# Patient Record
Sex: Female | Born: 1950
Health system: Southern US, Community
[De-identification: ages and names within clinical notes are randomized; demographics above are authoritative.]

## PROBLEM LIST (undated history)

## (undated) DIAGNOSIS — F419 Anxiety disorder, unspecified: Secondary | ICD-10-CM

## (undated) DIAGNOSIS — G709 Myoneural disorder, unspecified: Secondary | ICD-10-CM

## (undated) DIAGNOSIS — R278 Other lack of coordination: Secondary | ICD-10-CM

## (undated) DIAGNOSIS — M1990 Unspecified osteoarthritis, unspecified site: Secondary | ICD-10-CM

## (undated) DIAGNOSIS — M549 Dorsalgia, unspecified: Secondary | ICD-10-CM

## (undated) DIAGNOSIS — M2550 Pain in unspecified joint: Secondary | ICD-10-CM

## (undated) DIAGNOSIS — E041 Nontoxic single thyroid nodule: Secondary | ICD-10-CM

## (undated) DIAGNOSIS — Z923 Personal history of irradiation: Secondary | ICD-10-CM

## (undated) DIAGNOSIS — K760 Fatty (change of) liver, not elsewhere classified: Secondary | ICD-10-CM

## (undated) DIAGNOSIS — C50411 Malignant neoplasm of upper-outer quadrant of right female breast: Secondary | ICD-10-CM

## (undated) DIAGNOSIS — D649 Anemia, unspecified: Secondary | ICD-10-CM

## (undated) DIAGNOSIS — M21372 Foot drop, left foot: Secondary | ICD-10-CM

## (undated) DIAGNOSIS — F509 Eating disorder, unspecified: Secondary | ICD-10-CM

## (undated) DIAGNOSIS — J189 Pneumonia, unspecified organism: Secondary | ICD-10-CM

## (undated) DIAGNOSIS — E11319 Type 2 diabetes mellitus with unspecified diabetic retinopathy without macular edema: Secondary | ICD-10-CM

## (undated) DIAGNOSIS — I509 Heart failure, unspecified: Secondary | ICD-10-CM

## (undated) DIAGNOSIS — E669 Obesity, unspecified: Secondary | ICD-10-CM

## (undated) DIAGNOSIS — M21611 Bunion of right foot: Secondary | ICD-10-CM

## (undated) DIAGNOSIS — C50919 Malignant neoplasm of unspecified site of unspecified female breast: Secondary | ICD-10-CM

## (undated) DIAGNOSIS — IMO0001 Reserved for inherently not codable concepts without codable children: Secondary | ICD-10-CM

## (undated) DIAGNOSIS — G4730 Sleep apnea, unspecified: Secondary | ICD-10-CM

## (undated) DIAGNOSIS — E119 Type 2 diabetes mellitus without complications: Secondary | ICD-10-CM

## (undated) DIAGNOSIS — R011 Cardiac murmur, unspecified: Secondary | ICD-10-CM

## (undated) DIAGNOSIS — E785 Hyperlipidemia, unspecified: Secondary | ICD-10-CM

## (undated) DIAGNOSIS — Z91018 Allergy to other foods: Secondary | ICD-10-CM

## (undated) DIAGNOSIS — G4733 Obstructive sleep apnea (adult) (pediatric): Secondary | ICD-10-CM

## (undated) DIAGNOSIS — Z9989 Dependence on other enabling machines and devices: Secondary | ICD-10-CM

## (undated) DIAGNOSIS — R2689 Other abnormalities of gait and mobility: Secondary | ICD-10-CM

## (undated) DIAGNOSIS — N189 Chronic kidney disease, unspecified: Secondary | ICD-10-CM

## (undated) DIAGNOSIS — R51 Headache: Secondary | ICD-10-CM

## (undated) DIAGNOSIS — K5900 Constipation, unspecified: Secondary | ICD-10-CM

## (undated) DIAGNOSIS — I10 Essential (primary) hypertension: Secondary | ICD-10-CM

## (undated) DIAGNOSIS — F32A Depression, unspecified: Secondary | ICD-10-CM

## (undated) DIAGNOSIS — F329 Major depressive disorder, single episode, unspecified: Secondary | ICD-10-CM

## (undated) DIAGNOSIS — IMO0002 Reserved for concepts with insufficient information to code with codable children: Secondary | ICD-10-CM

## (undated) DIAGNOSIS — R040 Epistaxis: Secondary | ICD-10-CM

## (undated) DIAGNOSIS — N83209 Unspecified ovarian cyst, unspecified side: Secondary | ICD-10-CM

## (undated) HISTORY — PX: RETINAL LASER PROCEDURE: SHX2339

## (undated) HISTORY — DX: Hyperlipidemia, unspecified: E78.5

## (undated) HISTORY — PX: TOE SURGERY: SHX1073

## (undated) HISTORY — DX: Allergy to other foods: Z91.018

## (undated) HISTORY — DX: Obstructive sleep apnea (adult) (pediatric): Z99.89

## (undated) HISTORY — PX: TRIGGER FINGER RELEASE: SHX641

## (undated) HISTORY — DX: Reserved for concepts with insufficient information to code with codable children: IMO0002

## (undated) HISTORY — PX: DUPUYTREN CONTRACTURE RELEASE: SHX1478

## (undated) HISTORY — DX: Obesity, unspecified: E66.9

## (undated) HISTORY — DX: Personal history of irradiation: Z92.3

## (undated) HISTORY — DX: Essential (primary) hypertension: I10

## (undated) HISTORY — DX: Malignant neoplasm of unspecified site of unspecified female breast: C50.919

## (undated) HISTORY — DX: Dorsalgia, unspecified: M54.9

## (undated) HISTORY — DX: Obstructive sleep apnea (adult) (pediatric): G47.33

## (undated) HISTORY — DX: Reserved for inherently not codable concepts without codable children: IMO0001

## (undated) HISTORY — DX: Type 2 diabetes mellitus without complications: E11.9

## (undated) HISTORY — DX: Sleep apnea, unspecified: G47.30

## (undated) HISTORY — PX: APPENDECTOMY: SHX54

## (undated) HISTORY — DX: Eating disorder, unspecified: F50.9

## (undated) HISTORY — PX: CATARACT EXTRACTION: SUR2

## (undated) HISTORY — PX: COLONOSCOPY: SHX174

## (undated) HISTORY — DX: Malignant neoplasm of upper-outer quadrant of right female breast: C50.411

## (undated) HISTORY — DX: Type 2 diabetes mellitus with unspecified diabetic retinopathy without macular edema: E11.319

## (undated) HISTORY — PX: CARPAL TUNNEL RELEASE: SHX101

## (undated) HISTORY — DX: Bunion of right foot: M21.611

## (undated) HISTORY — DX: Anxiety disorder, unspecified: F41.9

## (undated) HISTORY — DX: Pain in unspecified joint: M25.50

## (undated) HISTORY — DX: Nontoxic single thyroid nodule: E04.1

## (undated) HISTORY — DX: Foot drop, left foot: M21.372

---

## 1997-06-15 ENCOUNTER — Encounter: Admission: RE | Admit: 1997-06-15 | Discharge: 1997-09-13 | Payer: Self-pay | Admitting: Family Medicine

## 1999-05-30 ENCOUNTER — Encounter: Admission: RE | Admit: 1999-05-30 | Discharge: 1999-08-28 | Payer: Self-pay | Admitting: Family Medicine

## 1999-07-31 ENCOUNTER — Other Ambulatory Visit: Admission: RE | Admit: 1999-07-31 | Discharge: 1999-07-31 | Payer: Self-pay | Admitting: Family Medicine

## 1999-09-26 ENCOUNTER — Encounter: Admission: RE | Admit: 1999-09-26 | Discharge: 1999-12-25 | Payer: Self-pay | Admitting: Internal Medicine

## 2001-09-16 ENCOUNTER — Other Ambulatory Visit: Admission: RE | Admit: 2001-09-16 | Discharge: 2001-09-16 | Payer: Self-pay | Admitting: Obstetrics and Gynecology

## 2001-11-05 ENCOUNTER — Encounter: Admission: RE | Admit: 2001-11-05 | Discharge: 2001-11-05 | Payer: Self-pay | Admitting: Family Medicine

## 2001-11-25 ENCOUNTER — Encounter: Admission: RE | Admit: 2001-11-25 | Discharge: 2001-11-25 | Payer: Self-pay | Admitting: Family Medicine

## 2001-12-10 ENCOUNTER — Encounter: Admission: RE | Admit: 2001-12-10 | Discharge: 2001-12-10 | Payer: Self-pay | Admitting: Family Medicine

## 2003-01-22 HISTORY — PX: ROTATOR CUFF REPAIR: SHX139

## 2003-02-16 ENCOUNTER — Ambulatory Visit (HOSPITAL_COMMUNITY): Admission: RE | Admit: 2003-02-16 | Discharge: 2003-02-16 | Payer: Self-pay | Admitting: *Deleted

## 2003-06-22 ENCOUNTER — Encounter: Admission: RE | Admit: 2003-06-22 | Discharge: 2003-06-22 | Payer: Self-pay | Admitting: Family Medicine

## 2003-07-19 ENCOUNTER — Ambulatory Visit (HOSPITAL_BASED_OUTPATIENT_CLINIC_OR_DEPARTMENT_OTHER): Admission: RE | Admit: 2003-07-19 | Discharge: 2003-07-19 | Payer: Self-pay | Admitting: Family Medicine

## 2003-11-21 ENCOUNTER — Encounter: Admission: RE | Admit: 2003-11-21 | Discharge: 2003-11-28 | Payer: Self-pay | Admitting: Family Medicine

## 2004-07-03 ENCOUNTER — Other Ambulatory Visit: Admission: RE | Admit: 2004-07-03 | Discharge: 2004-07-03 | Payer: Self-pay | Admitting: Family Medicine

## 2004-08-24 ENCOUNTER — Ambulatory Visit (HOSPITAL_COMMUNITY): Admission: RE | Admit: 2004-08-24 | Discharge: 2004-08-24 | Payer: Self-pay | Admitting: Orthopedic Surgery

## 2004-08-24 ENCOUNTER — Ambulatory Visit (HOSPITAL_BASED_OUTPATIENT_CLINIC_OR_DEPARTMENT_OTHER): Admission: RE | Admit: 2004-08-24 | Discharge: 2004-08-24 | Payer: Self-pay | Admitting: Orthopedic Surgery

## 2005-07-17 ENCOUNTER — Ambulatory Visit (HOSPITAL_BASED_OUTPATIENT_CLINIC_OR_DEPARTMENT_OTHER): Admission: RE | Admit: 2005-07-17 | Discharge: 2005-07-17 | Payer: Self-pay | Admitting: Orthopedic Surgery

## 2005-11-21 ENCOUNTER — Ambulatory Visit (HOSPITAL_BASED_OUTPATIENT_CLINIC_OR_DEPARTMENT_OTHER): Admission: RE | Admit: 2005-11-21 | Discharge: 2005-11-21 | Payer: Self-pay | Admitting: Orthopedic Surgery

## 2007-04-02 ENCOUNTER — Other Ambulatory Visit: Admission: RE | Admit: 2007-04-02 | Discharge: 2007-04-02 | Payer: Self-pay | Admitting: Obstetrics & Gynecology

## 2008-02-04 ENCOUNTER — Ambulatory Visit: Payer: Self-pay | Admitting: Cardiovascular Disease

## 2008-03-04 ENCOUNTER — Ambulatory Visit: Payer: Self-pay

## 2008-03-04 ENCOUNTER — Encounter: Payer: Self-pay | Admitting: Cardiovascular Disease

## 2008-03-09 ENCOUNTER — Ambulatory Visit: Payer: Self-pay

## 2008-03-31 ENCOUNTER — Ambulatory Visit (HOSPITAL_BASED_OUTPATIENT_CLINIC_OR_DEPARTMENT_OTHER): Admission: RE | Admit: 2008-03-31 | Discharge: 2008-03-31 | Payer: Self-pay | Admitting: Orthopedic Surgery

## 2008-03-31 ENCOUNTER — Encounter (INDEPENDENT_AMBULATORY_CARE_PROVIDER_SITE_OTHER): Payer: Self-pay | Admitting: Orthopedic Surgery

## 2008-05-17 ENCOUNTER — Other Ambulatory Visit: Admission: RE | Admit: 2008-05-17 | Discharge: 2008-05-17 | Payer: Self-pay | Admitting: Obstetrics and Gynecology

## 2010-01-26 LAB — BASIC METABOLIC PANEL
BUN: 19 mg/dL (ref 6–23)
CO2: 24 mEq/L (ref 19–32)
Calcium: 9.4 mg/dL (ref 8.4–10.5)
Chloride: 106 mEq/L (ref 96–112)
Creatinine, Ser: 1.17 mg/dL (ref 0.4–1.2)
GFR calc Af Amer: 58 mL/min — ABNORMAL LOW (ref >60)
GFR calc non Af Amer: 48 mL/min — ABNORMAL LOW (ref >60)
Glucose, Bld: 205 mg/dL — ABNORMAL HIGH (ref 70–99)
Potassium: 4.7 mEq/L (ref 3.5–5.1)
Sodium: 139 mEq/L (ref 135–145)

## 2010-01-30 ENCOUNTER — Ambulatory Visit
Admission: RE | Admit: 2010-01-30 | Discharge: 2010-01-30 | Payer: Self-pay | Source: Home / Self Care | Attending: Orthopedic Surgery | Admitting: Orthopedic Surgery

## 2010-02-05 LAB — GLUCOSE, CAPILLARY
Glucose-Capillary: 225 mg/dL — ABNORMAL HIGH (ref 70–99)
Glucose-Capillary: 233 mg/dL — ABNORMAL HIGH (ref 70–99)

## 2010-02-05 LAB — POCT HEMOGLOBIN-HEMACUE: Hemoglobin: 11.3 g/dL — ABNORMAL LOW (ref 12.0–15.0)

## 2010-03-12 NOTE — Op Note (Signed)
  NAMEJAZAYA, Darlene [name]           ACCOUNT NO.:  [id number]  MEDICAL RECORD NO.:  [id number]          PATIENT TYPE:  AMB  LOCATION:  [place]                          FACILITY:  [facility]  PHYSICIAN:  Daryll Brod, M.D.       DATE OF BIRTH:  1951-01-01  DATE OF PROCEDURE:  01/30/2010 DATE OF DISCHARGE:                              OPERATIVE REPORT   PREOPERATIVE DIAGNOSIS:  Stenosing tenosynovitis, right middle finger.  POSTOPERATIVE DIAGNOSIS:  Stenosing tenosynovitis, right middle finger.  OPERATION:  Release of A1 pulley, right middle finger.  SURGEON:  Daryll Brod, MD  ASSISTANT:  Dimas Millin, RN  ANESTHESIA:  Forearm-based IV regional.  HISTORY:  The patient is a 60 year old female with a history of triggering of her right middle finger.  This has not responded to conservative treatment.  She has elected to undergo surgical decompression.  Pre, peri, and postoperative course have been discussed along with risks and complications.  She is aware that there is no guarantee with surgery, possibility of infection, recurrence of injury to arteries, nerves, and tendons, incomplete relief of symptoms, and dystrophy.  In the preoperative area, the patient is seen, the extremity is marked by both the patient and surgeon, and antibiotic is given.  PROCEDURE:  The patient was brought to the operating room where a forearm-based IV regional anesthetic was carried out without difficulty. She was prepped using ChloraPrep, supine position with a right arm free. A 3-minute dry time was allowed.  A time-out was taken confirming the patient and procedure.  After adequate anesthesia was afforded, local infiltration was given with 0.25% Marcaine without epinephrine proximally, 4 mL was used.  An oblique incision was made over the A1 pulley of the right middle finger and carried down through subcutaneous tissue.  Bleeders were electrocauterized with bipolar.  Fairly significant scarring was present in the  palmar fascia above the A1 pulley.  This was released protecting neurovascular structures, both radially and ulnarly.  The A1 pulley was then released on its radial aspect.  Finger placed through full range of motion.  No further triggering was encountered.  The wound was copiously irrigated with saline.  The skin was then closed with interrupted 5-0 Vicryl Rapide sutures.  A sterile compressive dressing with the fingers free was applied.  On deflation of the tourniquet, all fingers immediately pinked.  She was taken to the recovery room for observation in satisfactory condition.  She will be discharged home to return to the [facility] in 1 week on Vicodin.          ______________________________ Daryll Brod, M.D.     GK/MEDQ  D:  01/30/2010  T:  01/30/2010  Job:  [id number]  cc:   Dub Mikes P. Jacelyn Grip, M.D.  Electronically Signed by Daryll Brod M.D. on 03/12/2010 02:22:37 PM

## 2010-04-23 ENCOUNTER — Ambulatory Visit: Payer: BC Managed Care – PPO | Attending: Family Medicine | Admitting: Physical Therapy

## 2010-04-23 DIAGNOSIS — R262 Difficulty in walking, not elsewhere classified: Secondary | ICD-10-CM | POA: Insufficient documentation

## 2010-04-23 DIAGNOSIS — IMO0001 Reserved for inherently not codable concepts without codable children: Secondary | ICD-10-CM | POA: Insufficient documentation

## 2010-04-23 DIAGNOSIS — R269 Unspecified abnormalities of gait and mobility: Secondary | ICD-10-CM | POA: Insufficient documentation

## 2010-04-30 ENCOUNTER — Ambulatory Visit: Payer: BC Managed Care – PPO | Admitting: Physical Therapy

## 2010-05-03 LAB — BASIC METABOLIC PANEL
BUN: 15 mg/dL (ref 6–23)
CO2: 25 mEq/L (ref 19–32)
Calcium: 9.1 mg/dL (ref 8.4–10.5)
Chloride: 106 mEq/L (ref 96–112)
Creatinine, Ser: 0.94 mg/dL (ref 0.4–1.2)
GFR calc Af Amer: >60 mL/min (ref >60)
GFR calc non Af Amer: >60 mL/min (ref >60)
Glucose, Bld: 182 mg/dL — ABNORMAL HIGH (ref 70–99)
Potassium: 5.2 mEq/L — ABNORMAL HIGH (ref 3.5–5.1)
Sodium: 139 mEq/L (ref 135–145)

## 2010-05-03 LAB — GLUCOSE, CAPILLARY
Glucose-Capillary: 152 mg/dL — ABNORMAL HIGH (ref 70–99)
Glucose-Capillary: 152 mg/dL — ABNORMAL HIGH (ref 70–99)

## 2010-05-03 LAB — POCT HEMOGLOBIN-HEMACUE: Hemoglobin: 11.7 g/dL — ABNORMAL LOW (ref 12.0–15.0)

## 2010-05-07 ENCOUNTER — Ambulatory Visit: Payer: BC Managed Care – PPO | Admitting: Physical Therapy

## 2010-05-14 ENCOUNTER — Ambulatory Visit: Payer: BC Managed Care – PPO | Admitting: Physical Therapy

## 2010-05-21 ENCOUNTER — Ambulatory Visit: Payer: BC Managed Care – PPO | Admitting: Physical Therapy

## 2010-05-28 ENCOUNTER — Ambulatory Visit: Payer: BC Managed Care – PPO | Attending: Family Medicine | Admitting: Physical Therapy

## 2010-05-28 DIAGNOSIS — R262 Difficulty in walking, not elsewhere classified: Secondary | ICD-10-CM | POA: Insufficient documentation

## 2010-05-28 DIAGNOSIS — R269 Unspecified abnormalities of gait and mobility: Secondary | ICD-10-CM | POA: Insufficient documentation

## 2010-05-28 DIAGNOSIS — IMO0001 Reserved for inherently not codable concepts without codable children: Secondary | ICD-10-CM | POA: Insufficient documentation

## 2010-06-05 NOTE — Op Note (Signed)
Darlene [name], [name]           ACCOUNT NO.:  [id number]   MEDICAL RECORD NO.:  [id number]          PATIENT TYPE:  AMB   LOCATION:  [facility]                          FACILITY:  [facility]   PHYSICIAN:  Daryll Brod, M.D.       DATE OF BIRTH:  09-15-1950   DATE OF PROCEDURE:  DATE OF DISCHARGE:                               OPERATIVE REPORT   PREOPERATIVE DIAGNOSIS:  Dupuytren contracture with stenosing  tenosynovitis, right ring finger.   POSTOPERATIVE DIAGNOSIS:  Dupuytren contracture with stenosing  tenosynovitis, right ring finger.   OPERATION:  Excision of palmar fascia release of A1 pulley right ring  finger.   SURGEON:  Daryll Brod, MD   ASSISTANT:  Dimas Millin, RN   ANESTHESIA:  Axillary block.   ANESTHESIOLOGIST:  Soledad Gerlach, MD   HISTORY:  The patient is a 60 year old female with a history of carpal  tunnel release.  She has developed a Dupuytren cord to her ring finger  with triggering.  This has not responded to conservative treatment.  She  does show pinning of her skin.  She is aware of risks and complications  including infection, recurrence of injury to arteries, nerves, tendons  complete relief of symptoms, dystrophy, the possibility of recurrence to  the Dupuytren cords.  In the preoperative area, the patient is seen, the  extremity marked by both the patient and surgeon.  Antibiotic given.   PROCEDURE:  The patient was brought to the operating room where an  axillary block was carried out without difficulty.  She was prepped  using DuraPrep, supine position, right arm free.  A time-out was taken  confirming the patient and procedure.   PROCEDURE:  The patient's limb was exsanguinated with an Esmarch  bandage, tourniquet placed high, and the arm was inflated 300 mmHg.  A  volar Brunner incision was made over the ring finger, carried down  through subcutaneous tissue.  Bleeders were electrocauterized.  Dissection was carried down to the palmar fascia.  This was  released  from the distal margin of the flexor retinaculum.  The cord to the  middle ring and little fingers were each resected proximally.  The cord  to the ring finger was then followed distally protecting the  neurovascular structures.  The ligaments of __________ were then  resected around the A1 pulley.  The A1 pulley was then released in its  radial aspect.  This allowed full flexion and extension of her finger  without further triggering.  The cords to the ring and middle finger  were also resected to the level of the A1 pulleys.  The wound was  irrigated with saline.  Bleeders were cauterized with bipolar.  Thrombin  was then sprayed over the wound.  A vessel loop drain was placed, and  the wound closed with interrupted 5-0 Vicryl Rapide sutures.  A sterile  compressive dressing was applied.  Tourniquet  deflated.  Fingers immediately pink.  A volar splint applied.  The  patient tolerated the procedure well and was taken to the recovery room  for observation in satisfactory condition.  She will be discharged home  to return to the [facility] in 1 week on Vicodin.            ______________________________  Daryll Brod, M.D.     GK/MEDQ  D:  03/31/2008  T:  03/31/2008  Job:  [id number]   cc:   Chipper Herb, M.D.

## 2010-06-05 NOTE — Assessment & Plan Note (Signed)
[facility] OFFICE NOTE   [name], [name]               MRN:          [id number]  DATE:02/04/2008                            DOB:          April 12, 1950    Darlene [name] is a 60 year old patient referred for question of a stress  test hypertension.   The patient also indicates that she has had a previous heart murmur.  Unfortunately, Darlene [name]'s biggest problem is her morbid obesity.  She is  305 pounds.  She said she has always been heavy and been a  yo-yo'er.  She has gained about 30 pounds in the last 6 months.   She tends to eat to release stress.  Because of her morbid obesity, she  has developed sleep apnea, hypertension, type 2 diabetes, and has had  some multiple orthopedic issues.   She also reports having chronic anxiety and depression.   She has a vague history of some sort of heart murmur.  She does not  recall having a recent stress test or echocardiogram.   She has exertional dyspnea.  She does not complain of chest pain.  There  have been occasional palpitations and chronic mild lower extremity  edema.   The patient has not thought seriously about bariatric surgery.  I had  long discussion with her regarding these issues and the fact that she is  at high risk for many morbid complications.  If she does not take  control of her weight, she already knows that her sleep apnea,  hypertension, and diabetes are related to her weight.   In regards to her heart, she certainly probably would benefit from a  Myoview study and an echo.  I did note that her EKG is abnormal.  She  has poor R-wave progression.  She has fairly large breasts and this just  maybe lead position and she has an isolated Q wave in lead III.  She is  also relatively tachycardic which maybe secondary to her weight as well.   We do not have any recent thyroid studies.  Her review of systems is  otherwise remarkable for  occasional headaches.  There is no history of  varicosities, DVT.   Her past medical history is otherwise remarkable for a previous right  rotator cuff surgery, trigger finger surgery, carpal tunnel surgery x2  as well as the above diabetes, central obesity, hypertension, anxiety,  and depression, and history of reflux.  The patient also complains of  some diarrhea, this seems to be a chronic problem, I believe she will be  referred to GI as well.  She is married.  She has no children.  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   She is sedentary.  Mother died at age 59 of a cerebral hemorrhage.  Father died at age 6 of congestive heart failure.   CURRENT MEDICATIONS:  1. Altace 10 mg a day.  2. Amlodipine 5 mg a day.  3. Hydrochlorothiazide 25 a day.  4. Prozac 20 a day.  5. Bupropion 200 a  day.  6. Simvastatin 20 a day.  7. NovoLog 14 units with meals.  8. Metformin 1 g b.i.d.  9. Lantus 100 units b.i.d.  10.Aspirin a day.  11.Calcium.   She has no known allergies.   PHYSICAL EXAMINATION:  GENERAL:  Morbidly obese female in no distress.  VITAL SIGNS:  Weight is 305, blood pressure is 140/80, pulse 95 and  regular, respiratory 14, and afebrile.  HEENT:  Unremarkable.  NECK:  Carotids are normal without bruit.  No lymphadenopathy,  thyromegaly, or JVP elevation.  LUNGS:  Clear with good diaphragmatic motion.  No wheezing.  CARDIAC:  S1 and S2 with soft systolic murmur.  PMI not palpable.  ABDOMEN:  Protuberant.  Bowel sounds positive.  No AAA, no tenderness,  no bruit, no hepatosplenomegaly or hepatojugular reflux.  EXTREMITIES:  Distal pulses intact with +1 edema.  NEUROLOGIC:  Nonfocal.  SKIN:  Warm and dry.  MUSCULOSKELETAL:  No muscular weakness.   EKG was as described, Q wave in lead III, relatively tachycardic, PR  interval 212, poor R-wave progression.   IMPRESSION:  1. Dyspnea, likely  secondary to morbid obesity.  Check stress Myoview.      Rule out ischemia.  2. Systolic ejection murmur.  Check 2-D echocardiogram.  Rule out      significant pulmonary hypertension from sleep apnea and check left      ventricular function.  3. Sleep apnea.  Follow up Pulmonary, will only be cured with weight      loss.  Continue CPAP as indicated.  4. Hypertension, currently well controlled.  Continue current dose of      ACE inhibitor.  5. Diabetes type 2, on large doses of Lantus, needs to be referred to      a nutritionist for decreased caloric and carbohydrate intake.  6. Hypercholesterolemia.  Continue simvastatin.  Lipid and liver      profile in 6 months.  7. Lower extremity edema, currently stable.  Continue      hydrochlorothiazide at current dose.  Elevate legs at the end of      the day.  8. Morbid obesity.  The number to the [facility] was given.  I      explained to her that this was her only meaningful way of losing      weight.  I suspect she will have many complications in the next 5      years if she does not opt for this type of surgery, particularly if      her Myoview      and echo show no obvious heart problems, she would be a very good      candidate for lap band procedure.     Wallis Bamberg. Johnsie Cancel, MD, [facility]  Electronically Signed    PCN/MedQ  DD: 02/04/2008  DT: 02/05/2008  Job #: [id number]   cc:   De Nurse

## 2010-06-08 NOTE — Op Note (Signed)
Darlene [name], Darlene [name]           ACCOUNT NO.:  [id number]   MEDICAL RECORD NO.:  [id number]          PATIENT TYPE:  AMB   LOCATION:  [place]                          FACILITY:  [facility]   PHYSICIAN:  Daryll Brod, M.D.       DATE OF BIRTH:  [birth year]   DATE OF PROCEDURE:  08/24/2004  DATE OF DISCHARGE:                                 OPERATIVE REPORT   PREOPERATIVE DIAGNOSIS:  Stenosing tenosynovitis, left ring finger; carpal  tunnel syndrome, left hand.   POSTOPERATIVE DIAGNOSIS:  Stenosing tenosynovitis, left ring finger; carpal  tunnel syndrome, left hand.   OPERATION:  Decompression left median nerve, release A1 pulley, left ring  finger.   SURGEON:  Kuzma.   ASSISTANT:  Dimas Millin R.N.   ANESTHESIA:  Forearm based IV regional.   HISTORY:  The patient is a 60 year old female with a history of locking of  her left ring finger, carpal tunnel syndrome, EMG nerve conductions  positive, which has not responded to conservative treatment.   PROCEDURE:  The patient is brought to the operating room where a forearm  based IV regional anesthetic was carried out without difficulty. She was  prepped using DuraPrep, supine position, left arm free. A oblique incision  was made over the A1 pulley of the left ring finger, carried down through  subcutaneous tissue. Bleeders were electrocauterized. Neurovascular  structures identified radially and ulnarly.  The A1 pulley was then released  on its radial aspect and incision was made in the A2 pulley centrally.  Finger placed through full range motion, no further triggering was  identified.  The wound was irrigated. Skin was closed with interrupted 5-0  nylon sutures. A separate incision was then made over the carpal canal  midpalm, carried down through subcutaneous tissue. Bleeders were again  electrocauterized. Palmar fascia was split, superficial palmar arch  identified, flexor tendon to the ring and little finger identified to the  ulnar side of  median nerve.  Carpal retinaculum was incised with sharp  dissection, right angle and Sewell retractor were placed between skin  forearm fascia. The fascia was released for approximately a centimeter and a  half proximal to the wrist crease under direct vision. The canal was  explored.  Tenosynovial tissues thickened and area of deformity to the nerve  apparent. No further lesions were identified. The wound was irrigated. Skin  closed with interrupted 5-0 nylon sutures. A sterile compressive dressing  and splint was applied. The patient tolerated the procedure well and was  taken to the recovery room for observation in satisfactory condition. She is  discharged home to return to the [facility] in one week on  Vicodin.       GK/MEDQ  D:  08/24/2004  T:  08/25/2004  Job:  [id number]

## 2010-06-08 NOTE — Op Note (Signed)
NAMENARALY, GOODLOE           ACCOUNT NO.:  [id number]   MEDICAL RECORD NO.:  [id number]          PATIENT TYPE:  AMB   LOCATION:  [place]                          FACILITY:  [facility]   PHYSICIAN:  Daryll Brod, M.D.       DATE OF BIRTH:  07/01/1950   DATE OF PROCEDURE:  11/21/2005  DATE OF DISCHARGE:                                 OPERATIVE REPORT   PREOPERATIVE DIAGNOSIS:  Carpal tunnel syndrome, right hand.   POSTOPERATIVE DIAGNOSIS:  Carpal tunnel syndrome, right hand.   OPERATION:  Decompression of right median nerve.   SURGEON:  Daryll Brod, M.D.   ASSISTANT:  Dimas Millin, R.N.   ANESTHESIA:  Forearm-based IV regional.   HISTORY:  The patient is a 60 year old female with a history of carpal  tunnel syndrome, EMG nerve conductions which has not responded to  conservative treatment.  She is aware of risks and complications including  infection, recurrence, injury to arteries, nerves, tendons complete relief  of symptoms, dystrophy.  She has elected to proceed to have this released.  In the preoperative area, questions are encouraged and answered.  The  extremity marked by both the patient and surgeon.   PROCEDURE:  The patient was brought to the operating room where a forearm-  based IV regional anesthetic was carried out without difficulty.  She was  prepped using DuraPrep, supine position, right arm free.   After adequate anesthesia was afforded to the patient, a longitudinal  incision was made in the palm and carried down through the subcutaneous  tissue.  Bleeders were electrocauterized.  She had some feeling.  The local  infiltration was given.  The superficial palmar arch was identified, flexor  tendon of the ring and little finger identified to the ulnar side of median  nerve and the carpal retinaculum was incised.  She had some feeling.  A  median nerve block was given at the wrist with 1% Xylocaine without  epinephrine.  The  completion of the transection of the carpal  retinaculum  was then performed, protecting the median nerve and ulnar nerve.  A Sewall  retractor was placed between the distal forearm fascia and the retinaculum  and skin, and along with a right angle retractor under direct vision, the  remainder of the carpal retinaculum was incised along with the distal  forearm fascia for approximately 1.5 cm proximal to the wrist crease.  The  canal was explored.  An area of compression to the nerve was apparent.  No  further lesions were identified.  The wound was irrigated.  The skin was  closed with interrupted 5-0 nylon sutures.  A sterile compressive dressing  and splint was applied.   The patient tolerated the procedure well was taken to the recovery room for  observation in satisfactory condition.  She is discharged home to return to  the [facility] in 1 week, on Vicodin.           ______________________________  Daryll Brod, M.D.     GK/MEDQ  D:  11/21/2005  T:  11/21/2005  Job:  [id number]   cc:  Francis P. Jacelyn Grip, M.D.

## 2010-06-08 NOTE — Consult Note (Signed)
[facility]  Patient:    Darlene [name], Darlene [name].[name]                      MRN: [id number] Proc. Date: 09/26/99 Adm. Date:  [id number] Attending:  Danny Lawless CC:         Anthoney Harada, M.D.   Consultation Report  HISTORY OF PRESENT ILLNESS:  This 60 year old white female is seen at the courtesy of Dr. Jacelyn Grip for evaluation and management of her feet in the face of known diabetes and chronic callus formation.  The patient apparently has had type 2 diabetes for the past 15 years and has been in chronic poor control, manifesting even here a rather flippant attitude about the importance of good self-care.  She is also quite obese with a resultant heavy load placed upon her feet.  She has had recurrent callus formation throughout the years and has fairly striking bunion formation, particularly on the left foot.  She has had paring of these calluses and was given a rigid orthotic insert by a podiatrist in the past, which insert has now cracked.  She has never had an ulceration of her feet and apparently does not have much in the way of neuropathic symptomatology.  Thus she presents now with recurrent, rather severe and uncomfortable calluses on the halluces and elsewhere on the plantar surfaces.  PAST MEDICAL HISTORY:  Really remarkable only for those things previously mentioned and for hypertension and depression.  She has had bone spurs removed from the fourth and fifth toes on the right foot in 1980 with no resulting surgical deformity.  MEDICATIONS:  Glucovance 5/500 two tablets b.i.d., a baby aspirin one daily, Altace 2.5 mg daily, Avandia 8 mg daily, Xenical 120 mg rarely used, Prozac 90 mg weekly.  ALLERGIES:  She has no known medicinal allergies.  PHYSICAL EXAMINATION:  Examination today is limited to the distal lower extremities.  The feet are without gross deformity with the exception of modest bunion formation (hallux valgus) bilaterally,  slightly greater on the left than on the right.  There is good preservation of the longitudinal arches bilaterally.  Skin temperatures are normal and essentially symmetrical.  Her pulses are everywhere palpable and quite adequate.  There is no edema. Monofilament testing shows protective sensation throughout.  There are no significant calluses over the bunions.  There is widespread bilateral callus formation in the heels, and there are specific hard, hypertrophic calluses at the interphalangeal joints at the hallux bilaterally.  There are also calluses underlying the fifth metatarsal head on the right and the first metatarsal head on the left.  DISPOSITION: 1. The patient is given general instruction regarding foot care and diabetes    by video instruction with nurse and physician reinforcement.  This    physician also reinforced the importance of her bringing her diabetes under    better control (recent hemoglobin A1C 9.1%). 2. The aforementioned calluses were removed by dremeling on both heels and by    sharp paring underlying the metatarsal head areas and on both halluces. 3. It was recommended to the patient that she obtain a soft over-the-counter    insert for each foot and to specifically search for one that had a    longitudinal arch support built into it.  In that she has preservation of    protective sensation of both feet, she does not qualify for insurance    purchase of custom inserts. 4. Again it was emphasized to the  patient the need for weight reduction, good    diabetes control, and proper shoe and footwear in diabetes as well as    proper care of the feet. 5. Follow-up visit to this clinic was to be on a p.r.n. basis. DD:  09/26/99 TD:  09/27/99 Job: [id number]

## 2010-06-08 NOTE — Op Note (Signed)
NAMEJACLEEN, Darlene [name]           ACCOUNT NO.:  [id number]   MEDICAL RECORD NO.:  [id number]          PATIENT TYPE:  AMB   LOCATION:  [place]                          FACILITY:  [facility]   PHYSICIAN:  Daryll Brod, M.D.       DATE OF BIRTH:  May 26, 1950   DATE OF PROCEDURE:  07/17/2005  DATE OF DISCHARGE:                                 OPERATIVE REPORT   PREOPERATIVE DIAGNOSIS:  Stenosing tenosynovitis left middle finger.   POSTOPERATIVE DIAGNOSIS:  Stenosing tenosynovitis left middle finger.   OPERATION:  Release A1 pulley left middle finger.   SURGEON:  Daryll Brod, M.D.   ANESTHESIA:  Forearm based IV regional.   HISTORY:  The patient is a 60 year old female with history of triggering on  multiple digits.  She has recently begun triggering of her left middle  finger.  This did not respond to conservative treatment.  She is admitted  now for release.  In the preoperative area questions were encouraged and  answered.  The site marked by both the patient and surgeon.   PROCEDURE:  The patient was brought to the operating room where a forearm  based IV regional anesthetic was carried out without difficulty.  She was  prepped using DuraPrep, supine position, left arm free.  After 3-minute dry  time she was draped.  An oblique incision was made over the A1 pulley of the  left middle finger, carried down through subcutaneous tissue.  Bleeders were  electrocauterized.  The digital artery and nerve radially and ulnarly were  identified.  The A1 pulley was then released on its radial aspect.  An  incision was made in the central portion proximally of the A2 pulley.  The  finger placed through a full range motion, no further triggering was  identified.  The wound was irrigated.  The skin closed with interrupted 5-0  nylon sutures.  Sterile compressive dressing was applied.  The patient  tolerated the procedure well and was taken to the recovery room for  observation in satisfactory condition.   She is discharged home to return to  [facility] in 1 week on Vicodin.           ______________________________  Daryll Brod, M.D.     GK/MEDQ  D:  07/17/2005  T:  07/17/2005  Job:  [id number]

## 2010-11-19 ENCOUNTER — Encounter: Payer: Self-pay | Admitting: *Deleted

## 2010-11-19 ENCOUNTER — Encounter: Payer: BC Managed Care – PPO | Attending: Endocrinology | Admitting: *Deleted

## 2010-11-19 DIAGNOSIS — E119 Type 2 diabetes mellitus without complications: Secondary | ICD-10-CM | POA: Insufficient documentation

## 2010-11-19 DIAGNOSIS — Z713 Dietary counseling and surveillance: Secondary | ICD-10-CM | POA: Insufficient documentation

## 2010-11-19 NOTE — Progress Notes (Signed)
  Medical Nutrition Therapy:  Appt start time: 0800 end time:  0900.   Assessment:  Primary concerns today: Nutrition counseling for weight loss and Type 2 Diabetes, diagnosed in 1986. Expresses good knowledge of diabetes, nutrition recommendations and value of exercise. She is going to water aerobics 3 evenings a week for past month with good success. She states her husband has been successful with significant weight loss this past year with portion control, which she struggles with. She tests her BG 1-5 times per day and takes her insulin "most of the time", the lunch dose is difficult sometimes.  MEDICATIONS: see list. She states she may be changing from Novolog 70/30 TID various dose at each meal back to Lantus BID and Novolog at meals.    DIETARY INTAKE:  Usual eating pattern includes 3 meals and 3 snacks per day.  Everyday foods include good variety of all food groups.  Avoided foods include dairy milk.    24-hr recall:  B ( AM): 1-2 sausage patties, english muffin or flat bagel, almond milk and occasional fresh fruit  Snk ( AM): there is candy and crackers readily available at work which she tries to avoid  L ( PM): has switched from fast food daily to bringing frozen meals such as Lean Cuisine or Healthy Choice with low fat yogurt and fruit Snk ( PM): candy, crackers per above D ( PM): husband prepares supper, now eating lean meat and 2 vegetables, occasionally a starch Snk ( PM): occasional Beverages: almond milk, diet Mountain Dew  Usual physical activity: attends water aerobic class 3 evenings a week!  Estimated energy needs: 1500 calories 170 g carbohydrates 112 g protein 42 g fat  Progress Towards Goal(s):  In progress.   Nutritional Diagnosis:  NI-5.8.4 Inconsistent carbohydrate intake As related to Type 2 diabetes and her insulin doses.  As evidenced by variable BG including hyper and hypoglycemia.    Intervention:  Nutrition counseling provided with the following  goals set: Aim for 3 carb choices per meal, 1 per snack Give herself credit for habits she is doing well, vs only criticism. Continue with water aerobic classes 3 times per week Consider using 1/2 cup measure as serving utensil for starchy foods.  Handouts given during visit include:  Carb Counting and Meal Planning by NovoNordisc  Carb Counting and Beyond Handout  Monitoring/Evaluation:  Dietary intake of 3 Carbs/Meal and 1/snack if hungry, exercise, positive reinforcement of successes , and body weight in 4 week(s).

## 2010-11-27 ENCOUNTER — Telehealth: Payer: Self-pay | Admitting: Cardiovascular Disease

## 2010-11-27 NOTE — Telephone Encounter (Signed)
Spoke with nicole, results from echo from 2010 faxed to the number provided Darlene [name]

## 2010-11-27 NOTE — Telephone Encounter (Signed)
Elmyra Ricks from [facility] calling wanting results of pt ECHO. Please fax results to office.   Fax: [phone]

## 2011-06-04 LAB — HM PAP SMEAR: HM Pap smear: NEGATIVE

## 2011-11-29 LAB — HM MAMMOGRAPHY: HM Mammogram: NORMAL

## 2012-06-05 ENCOUNTER — Ambulatory Visit: Payer: Self-pay | Admitting: Nurse Practitioner

## 2012-06-18 ENCOUNTER — Encounter: Payer: Self-pay | Admitting: *Deleted

## 2012-06-23 ENCOUNTER — Ambulatory Visit (INDEPENDENT_AMBULATORY_CARE_PROVIDER_SITE_OTHER): Payer: BC Managed Care – PPO | Admitting: Nurse Practitioner

## 2012-06-23 ENCOUNTER — Encounter: Payer: Self-pay | Admitting: Nurse Practitioner

## 2012-06-23 VITALS — BP 142/60 | HR 66 | Ht 68.0 in | Wt 286.6 lb

## 2012-06-23 DIAGNOSIS — Z01419 Encounter for gynecological examination (general) (routine) without abnormal findings: Secondary | ICD-10-CM

## 2012-06-23 NOTE — Patient Instructions (Signed)

## 2012-06-23 NOTE — Progress Notes (Signed)
62 y.o. [id number] Married Caucasian Fe here for annual exam.  She feels well, no vaso symptoms.  Rarely sexually active secondary to husbands health issues.   History of endo biopsy 02/03/08 showing scant proliferative endometrium and had been on Provera 5 mg 1/2 tab daily. At last AEX she was to be on Provera 10 mg every 3 months. Patient got Rx but was confused about when to take and for how many days.  So she did not take Rx.    Patient's last menstrual period was 10/22/2007.          Sexually active: yes  The current method of family planning is post menopausal status.    Exercising: no  The patient does not participate in regular exercise at present. Smoker:  no  Health Maintenance: Pap:  06/04/2011  Negative with neg HR HPV MMG:  11/29/2011 normal Colonoscopy:  2004 normal Will get repeat at age 52, PCP is scheduling BMD:   never TDaP:  PCP maintains tetanus.  Labs: PCP does lab (blood) work.    reports that she has never smoked. She has never used smokeless tobacco. She reports that she does not drink alcohol or use illicit drugs.  Past Medical History  Diagnosis Date  . Obesity   . Eating disorder   . Hypertension   . Amenorrhea   . Hyperlipidemia   . Diabetes mellitus without complication   . Dizzy spells     History reviewed. No pertinent past surgical history.  Current Outpatient Prescriptions  Medication Sig Dispense Refill  . aspirin 81 MG tablet Take 81 mg by mouth daily.        . B-D INS SYR ULTRAFINE 1CC/30G 30G X 1/2" 1 ML MISC       . buPROPion (WELLBUTRIN XL) 300 MG 24 hr tablet Take 300 mg by mouth daily.        . hydrochlorothiazide (HYDRODIURIL) 25 MG tablet Take 25 mg by mouth daily.        . insulin aspart protamine-insulin aspart (NOVOLOG 70/30) (70-30) 100 UNIT/ML injection Inject 60 Units into the skin daily with breakfast.        . losartan-hydrochlorothiazide (HYZAAR) 100-25 MG per tablet       . metFORMIN (GLUCOPHAGE) 1000 MG tablet Take 1,000 mg by  mouth 2 (two) times daily with a meal.        . metoprolol (TOPROL-XL) 200 MG 24 hr tablet       . mometasone (NASONEX) 50 MCG/ACT nasal spray Place 2 sprays into the nose daily as needed.        . simvastatin (ZOCOR) 20 MG tablet Take 20 mg by mouth at bedtime.         No current facility-administered medications for this visit.    Family History  Problem Relation Age of Onset  . CVA Mother   . Diabetes Father   . Heart failure Father   . Hypertension Sister   . Diabetes Brother     ROS:  Pertinent items are noted in HPI.  Otherwise, a comprehensive ROS was negative.  Exam:   BP 142/60  Pulse 66  Ht 5\' 8"  (1.727 m)  Wt 286 lb 9.6 oz (130.001 kg)  BMI 43.59 kg/m2  LMP 10/22/2007 Height: 5\' 8"  (172.7 cm)  Ht Readings from Last 3 Encounters:  06/23/12 5\' 8"  (1.727 m)  11/19/10 5\' 9"  (1.753 m)    General appearance: alert, cooperative and appears stated age Head: Normocephalic, without obvious abnormality, atraumatic  Neck: no adenopathy, supple, symmetrical, trachea midline and thyroid normal to inspection and palpation Lungs: clear to auscultation bilaterally Breasts: normal appearance, no masses or tenderness Heart: regular rate and rhythm Abdomen: soft, non-tender; no masses,  no organomegaly Extremities: extremities normal, atraumatic, no cyanosis or edema Skin: Skin color, texture, turgor normal. No rashes or lesions Lymph nodes: Cervical, supraclavicular, and axillary nodes normal. No abnormal inguinal nodes palpated Neurologic: Grossly normal   Pelvic: External genitalia:  no lesions              Urethra:  normal appearing urethra with no masses, tenderness or lesions              Bartholin's and Skene's: normal                 Vagina: normal appearing vagina with normal color and discharge, no lesions              Cervix: anteverted              Pap taken: no Bimanual Exam:  Uterus:  normal size, contour, position, consistency, mobility, non-tender               Adnexa: no mass, fullness, tenderness               Rectovaginal: Confirms               Anus:  normal sphincter tone, no lesions  A:  Well Woman with normal exam  Postmenopausal  Obesity, DM, HTN  P:   Pap smear as per guidelines   Mammogram due 11/14   return annually or prn  An After Visit Summary was printed and given to the patient.

## 2012-06-24 NOTE — Progress Notes (Signed)
Due to her obesity, I think you should prescribe provera 10mg  x 10 days every three months.  If she doesn't bleed in a year, maybe decrease to twice yearly.

## 2012-06-25 NOTE — Progress Notes (Signed)
Let patient know that I discussed with Dr. Sabra Heck and she does want her to take Provera 10 mg for 10 days every 3 months.  This was the original Rx. But patient could not understand the directions so failed to take for this entire year.  Please explain this to her when you call.

## 2012-07-22 ENCOUNTER — Telehealth: Payer: Self-pay | Admitting: *Deleted

## 2012-07-22 NOTE — Telephone Encounter (Signed)
Message copied by Ellene Route on Wed Jul 22, 2012 12:01 PM ------      Message from: Kem Boroughs R      Created: Thu Jun 25, 2012  8:40 AM                   ----- Message -----         From: Lyman Speller, MD         Sent: 06/24/2012   8:39 AM           To: Milford Cage, FNP                        ----- Message -----         From: Milford Cage, FNP         Sent: 06/23/2012   5:17 PM           To: Lyman Speller, MD             ------

## 2012-07-22 NOTE — Telephone Encounter (Signed)
Message copied by Ellene Route on Wed Jul 22, 2012 11:58 AM ------      Message from: Kem Boroughs R      Created: Thu Jun 25, 2012  8:40 AM                   ----- Message -----         From: Lyman Speller, MD         Sent: 06/24/2012   8:39 AM           To: Milford Cage, FNP                        ----- Message -----         From: Milford Cage, FNP         Sent: 06/23/2012   5:17 PM           To: Lyman Speller, MD             ------

## 2012-07-22 NOTE — Telephone Encounter (Signed)
Message copied by Ellene Route on Wed Jul 22, 2012 11:54 AM ------      Message from: Kem Boroughs R      Created: Thu Jun 25, 2012  8:40 AM                   ----- Message -----         From: Lyman Speller, MD         Sent: 06/24/2012   8:39 AM           To: Milford Cage, FNP                        ----- Message -----         From: Milford Cage, FNP         Sent: 06/23/2012   5:17 PM           To: Lyman Speller, MD             ------

## 2012-07-22 NOTE — Telephone Encounter (Signed)
Patient was notified of response from Dr. Sabra Heck and Edman Circle concerning mediation of Provera. Patient states she remembers discussing this with Patty and at this time does not wish to do any medications. Patient states she is in the process of being seen to have lap band surgery for her obesity and feels she does not need any medication at this time but will  Call if has any vaginal spotting.

## 2012-07-23 ENCOUNTER — Other Ambulatory Visit: Payer: Self-pay | Admitting: Internal Medicine

## 2012-09-03 ENCOUNTER — Ambulatory Visit (INDEPENDENT_AMBULATORY_CARE_PROVIDER_SITE_OTHER): Payer: BC Managed Care – PPO | Admitting: Surgery

## 2012-09-03 ENCOUNTER — Encounter (INDEPENDENT_AMBULATORY_CARE_PROVIDER_SITE_OTHER): Payer: Self-pay | Admitting: Surgery

## 2012-09-03 ENCOUNTER — Other Ambulatory Visit (INDEPENDENT_AMBULATORY_CARE_PROVIDER_SITE_OTHER): Payer: Self-pay

## 2012-09-03 DIAGNOSIS — E119 Type 2 diabetes mellitus without complications: Secondary | ICD-10-CM | POA: Insufficient documentation

## 2012-09-03 DIAGNOSIS — Z794 Long term (current) use of insulin: Secondary | ICD-10-CM | POA: Insufficient documentation

## 2012-09-03 DIAGNOSIS — Z6841 Body Mass Index (BMI) 40.0 and over, adult: Secondary | ICD-10-CM

## 2012-09-03 NOTE — Patient Instructions (Signed)
Two weeks prior to surgery  Go on the extremely low carb liquid diet One week prior to surgery  No aspirin products.  Tylenol is acceptable  Stop smoking 24 hours prior to surgery  No alcoholic beverages  Report fever greater than 100.5 or excessive nasal drainage suggesting infection  Continue bariatric preop diet  Perform bowel prep if ordered  Do not eat or drink anything after midnight the night before surgery  Do not take any medications except those instructed by the anesthesiologist Morning of surgery  Please arrive at the hospital at least 2 hours before your scheduled surgery time.  No makeup, fingernail polish or jewelry  Bring insurance cards with you  Bring your CPAP mask if you use this   

## 2012-09-03 NOTE — Progress Notes (Signed)
Chief Complaint:  Interested in bariatric surgery; lapband;  BMi 8  History of Present Illness:  Darlene [name] is an 62 y.o. female who has had a lifelong obesity issues and he comes today with her husband to discuss laparoscopic adjustable gastric bands. She has had problems with obesity most all of her life. She has tried numerous weight loss he experiences to try to help control her type 2 diabetes. At one point she lost 90 pounds. Her diabetes was greatly improved by that.  We had a discussion regarding lap band, gastric sleeve, and Roux-en-Y gastric bypass. She understands the elements of these but is leaning toward laparoscopic adjustable gastric banding. I think this most since her comfort level. She was again the journey and I described the workup. She still has her gallbladder.  Past Medical History  Diagnosis Date  . Obesity   . Eating disorder   . Hypertension   . Hyperlipidemia   . Diabetes mellitus without complication   . Dizzy spells     Past Surgical History  Procedure Laterality Date  . Carpal tunnel release Right   . Carpal tunnel release Left     Current Outpatient Prescriptions  Medication Sig Dispense Refill  . aspirin 81 MG tablet Take 81 mg by mouth daily.        . B-D INS SYR ULTRAFINE 1CC/30G 30G X 1/2" 1 ML MISC       . insulin aspart protamine-insulin aspart (NOVOLOG 70/30) (70-30) 100 UNIT/ML injection Inject 60 Units into the skin daily with breakfast.        . losartan-hydrochlorothiazide (HYZAAR) 100-25 MG per tablet       . metFORMIN (GLUCOPHAGE) 1000 MG tablet Take 1,000 mg by mouth 2 (two) times daily with a meal.        . metoprolol (TOPROL-XL) 200 MG 24 hr tablet       . pramipexole (MIRAPEX) 0.125 MG tablet       . simvastatin (ZOCOR) 20 MG tablet Take 20 mg by mouth at bedtime.        . Vortioxetine HBr (BRINTELLIX) 10 MG TABS Take by mouth.      . hydrochlorothiazide (HYDRODIURIL) 25 MG tablet Take 25 mg by mouth daily.        .  mometasone (NASONEX) 50 MCG/ACT nasal spray Place 2 sprays into the nose daily as needed.         No current facility-administered medications for this visit.   Other Family History  Problem Relation Age of Onset  . CVA Mother   . Diabetes Father   . Heart failure Father   . Hypertension Sister   . Diabetes Brother    Social History:   reports that she has never smoked. She has never used smokeless tobacco. She reports that she does not drink alcohol or use illicit drugs.   REVIEW OF SYSTEMS - PERTINENT POSITIVES ONLY: Negative for DVT. She's never had any problems with her heart or GI issues.  Physical Exam:   Blood pressure 148/84, pulse 64, temperature 98.2 F (36.8 C), temperature source Temporal, resp. rate 15, height 5\' 9"  (1.753 m), weight 291 lb 9.6 oz (132.269 kg), last menstrual period 10/22/2007. Body mass index is 43.04 kg/(m^2).  Gen:  WDWN white female NAD  Neurological: Alert and oriented to person, place, and time. Motor and sensory function is grossly intact  Head: Normocephalic and atraumatic.  Eyes: Conjunctivae are normal. Pupils are equal, round, and reactive to light. No scleral  icterus.  Neck: Normal range of motion. Neck supple. No tracheal deviation or thyromegaly present.  Cardiovascular:  SR without murmurs or gallops.  No carotid bruits Respiratory: Effort normal.  No respiratory distress. No chest wall tenderness. Breath sounds normal.  No wheezes, rales or rhonchi.  Abdomen:  nontender GU: Musculoskeletal: Normal range of motion. Extremities are nontender. No cyanosis, edema or clubbing noted Lymphadenopathy: No cervical, preauricular, postauricular or axillary adenopathy is present Skin: Skin is warm and dry. No rash noted. No diaphoresis. No erythema. No pallor. Pscyh: Normal mood and affect. Behavior is normal. Judgment and thought content normal.   LABORATORY RESULTS: No results found for this or any previous visit (from the past 48  hour(s)).  RADIOLOGY RESULTS: No results found.  Problem List: There are no active problems to display for this patient.   Assessment & Plan: Morbid obesity with BMI 43 and diabetes mellitus. Plan begin workup for laparoscopic adjustable gastric banding.    Matt B. Hassell Done, MD, [facility] [phone] beeper [phone]  09/03/2012 12:11 PM

## 2012-09-04 ENCOUNTER — Encounter (INDEPENDENT_AMBULATORY_CARE_PROVIDER_SITE_OTHER): Payer: Self-pay

## 2012-09-09 ENCOUNTER — Other Ambulatory Visit (INDEPENDENT_AMBULATORY_CARE_PROVIDER_SITE_OTHER): Payer: Self-pay

## 2012-09-10 ENCOUNTER — Ambulatory Visit (INDEPENDENT_AMBULATORY_CARE_PROVIDER_SITE_OTHER): Payer: BC Managed Care – PPO | Admitting: Surgery

## 2012-09-10 ENCOUNTER — Encounter (INDEPENDENT_AMBULATORY_CARE_PROVIDER_SITE_OTHER): Payer: Self-pay | Admitting: Surgery

## 2012-09-10 VITALS — BP 140/80 | HR 64 | Temp 98.4°F | Resp 15 | Ht 69.0 in | Wt 291.4 lb

## 2012-09-10 DIAGNOSIS — E785 Hyperlipidemia, unspecified: Secondary | ICD-10-CM

## 2012-09-10 DIAGNOSIS — F32A Depression, unspecified: Secondary | ICD-10-CM

## 2012-09-10 DIAGNOSIS — F329 Major depressive disorder, single episode, unspecified: Secondary | ICD-10-CM | POA: Insufficient documentation

## 2012-09-10 NOTE — Patient Instructions (Signed)
Gastric Bypass Surgery Care After Refer to this sheet in the next few weeks. These discharge instructions provide you with general information on caring for yourself after you leave the hospital. Your caregiver may also give you specific instructions. Your treatment has been planned according to the most current medical practices available, but unavoidable complications sometimes occur. If you have any problems or questions after discharge, call your caregiver. HOME CARE INSTRUCTIONS  Activity  Take frequent walks throughout the day. This will help to prevent blood clots. Do not sit for longer than 45 minutes to 1 hour while awake for 4 to 6 weeks after surgery.  Continue to do coughing and deep breathing exercises once you get home. This will help to prevent pneumonia.  Do not do strenuous activities, such as heavy lifting, pushing, or pulling, until after your follow-up visit with your caregiver. Do not lift anything heavier than 10 lb (4.5 kg).  Talk with your caregiver about when you may return to work and your exercise routine.  Do not drive while taking prescription pain medicine. Nutrition  It is very important that you drink at least 80 oz (2,400 mL) of fluid a day.  You should stay on a clear liquid diet until your follow-up visit with your caregiver. Keep sugar-free, clear liquid items on hand, including:  Tea: hot or cold. Drink only decaffeinated for the first month.  Broths: clear beef, chicken, vegetable.  Others: water, sugar-free frozen ice pops, flavored water, gelatin (after 1 week).  Do not consume caffeine for 1 month. Large amounts of caffeine can cause dehydration.  A dietician may also give you specific instructions.  Follow your caregiver's recommendations about vitamins and protein requirements after surgery. Hygiene  You may shower and wash your hair 2 days after surgery. Pat incisions dry. Do not rub incisions with a washcloth or towel.  Follow your  caregiver's recommendations about baths and pools following surgery. Pain control  If a prescription medicine was given, follow your caregiver's directions.  You may feel some gas pain caused by the carbon dioxide used to inflate your abdomen during surgery. This pain can be felt in your chest, shoulder, back, or abdominal area. Moving around often is advised. Incision care  You may have 4 or more small incisions. They are closed with skin adhesive strips. Skin adhesive strips can get wet and will fall off on their own. Check your incisions and surrounding area daily for any redness, swelling, discoloration, fluid (drainage), or bleeding. Dark red, dried blood may appear under these coverings. This is normal.  If you have a drain, it will be removed at your follow-up visit or before you leave the hospital.  If your drain is left in, follow your caregiver's instructions on drain care.  If your drain is taken out, keep a clean, dry bandage over the drain site. SEEK MEDICAL CARE IF:   You develop persistent nausea and vomiting.  You have pain and discomfort with swallowing.  You have pain, swelling, or warmth in the lower extremities.  You have an oral temperature above 102 F (38.9 C).  You develop chills.  Your incision sites look red, swollen, or have drainage.  Your stool is black, tarry, or maroon in color.  You are lightheaded when standing.  You notice a bruise getting larger.  You have any questions or concerns. SEEK IMMEDIATE MEDICAL CARE IF:   You have chest pain.  You have severe calf pain or pain not relieved by medicine.  You  develop shortness of breath or difficulty breathing.  There is bright red blood coming from the drain.  You feel confused.  You have slurred speech.  You suddenly feel weak. MAKE SURE YOU:   Understand these instructions.  Will watch your condition.  Will get help right away if you are not doing well or get worse. Document  Released: 08/22/2003 Document Revised: 04/01/2011 Document Reviewed: 05/30/2009 [facility] Patient Information 2014 [place].

## 2012-09-10 NOTE — Progress Notes (Signed)
This was a consultative visit in excess of 30 minutes to discuss lap band versus gastric bypass. I went back and discuss a lot of history about these operations and also to answer questions about mini gastric bypass that were missed leading. I would agree that she might well be best with a Roux-en-Y gastric bypass. Dr. Suzette Battiest told her that she should not have a lapband procedure.  I need to give Dr. Chalmers Cater some followup data about the surgical options.    I encouraged her to continue to ask good questions and complete her bariatric workup and decide about the procedure some time along the way.

## 2012-09-13 ENCOUNTER — Other Ambulatory Visit: Payer: Self-pay | Admitting: Internal Medicine

## 2012-09-14 ENCOUNTER — Other Ambulatory Visit: Payer: Self-pay | Admitting: Internal Medicine

## 2012-09-15 ENCOUNTER — Ambulatory Visit (HOSPITAL_COMMUNITY)
Admission: RE | Admit: 2012-09-15 | Discharge: 2012-09-15 | Disposition: A | Discharge status: Home / Self Care | Payer: BC Managed Care – PPO | Source: Ambulatory Visit | Attending: Surgery | Admitting: Surgery

## 2012-09-15 ENCOUNTER — Encounter (HOSPITAL_COMMUNITY)
Admission: RE | Disposition: A | Discharge status: Home / Self Care | Payer: Self-pay | Source: Ambulatory Visit | Attending: Surgery

## 2012-09-15 DIAGNOSIS — Z01818 Encounter for other preprocedural examination: Secondary | ICD-10-CM | POA: Insufficient documentation

## 2012-09-15 HISTORY — PX: BREATH TEK H PYLORI: SHX5422

## 2012-09-15 SURGERY — BREATH TEST, FOR HELICOBACTER PYLORI

## 2012-09-16 ENCOUNTER — Ambulatory Visit (HOSPITAL_COMMUNITY)
Admission: RE | Admit: 2012-09-16 | Discharge: 2012-09-16 | Disposition: A | Discharge status: Home / Self Care | Payer: BC Managed Care – PPO | Source: Ambulatory Visit | Attending: Surgery | Admitting: Surgery

## 2012-09-16 ENCOUNTER — Encounter (HOSPITAL_COMMUNITY): Payer: Self-pay | Admitting: Surgery

## 2012-09-16 ENCOUNTER — Other Ambulatory Visit: Payer: Self-pay

## 2012-09-16 DIAGNOSIS — E119 Type 2 diabetes mellitus without complications: Secondary | ICD-10-CM | POA: Insufficient documentation

## 2012-09-16 DIAGNOSIS — I517 Cardiomegaly: Secondary | ICD-10-CM | POA: Insufficient documentation

## 2012-09-16 DIAGNOSIS — E785 Hyperlipidemia, unspecified: Secondary | ICD-10-CM | POA: Insufficient documentation

## 2012-09-16 DIAGNOSIS — Z780 Asymptomatic menopausal state: Secondary | ICD-10-CM | POA: Insufficient documentation

## 2012-09-16 DIAGNOSIS — I700 Atherosclerosis of aorta: Secondary | ICD-10-CM | POA: Insufficient documentation

## 2012-09-16 DIAGNOSIS — K8020 Calculus of gallbladder without cholecystitis without obstruction: Secondary | ICD-10-CM | POA: Insufficient documentation

## 2012-09-16 DIAGNOSIS — I10 Essential (primary) hypertension: Secondary | ICD-10-CM | POA: Insufficient documentation

## 2012-09-16 DIAGNOSIS — Z13820 Encounter for screening for osteoporosis: Secondary | ICD-10-CM | POA: Insufficient documentation

## 2012-09-16 DIAGNOSIS — Z6841 Body Mass Index (BMI) 40.0 and over, adult: Secondary | ICD-10-CM | POA: Insufficient documentation

## 2012-09-16 DIAGNOSIS — K7689 Other specified diseases of liver: Secondary | ICD-10-CM | POA: Insufficient documentation

## 2012-09-16 DIAGNOSIS — F509 Eating disorder, unspecified: Secondary | ICD-10-CM | POA: Insufficient documentation

## 2012-09-26 ENCOUNTER — Ambulatory Visit: Payer: BC Managed Care – PPO | Admitting: *Deleted

## 2012-09-29 ENCOUNTER — Encounter: Payer: BC Managed Care – PPO | Attending: Surgery | Admitting: *Deleted

## 2012-09-29 ENCOUNTER — Encounter: Payer: Self-pay | Admitting: *Deleted

## 2012-09-29 DIAGNOSIS — Z713 Dietary counseling and surveillance: Secondary | ICD-10-CM | POA: Insufficient documentation

## 2012-09-29 NOTE — Patient Instructions (Addendum)
   Follow Pre-Op Nutrition Goals to prepare for Gastric Bypass Surgery.   Call the [facility] at [phone] once you have been given your surgery date to enrolled in the Pre-Op Nutrition Class. You will need to attend this nutrition class 3-4 weeks prior to your surgery. 

## 2012-09-29 NOTE — Progress Notes (Addendum)
  Pre-Op Assessment Visit:  Pre-Operative  [facility] Surgery  Medical Nutrition Therapy:  Appt start time: 0900   End time:  1000.  Patient was seen on 09/29/2012 for Pre-Operative [facility] Nutrition Assessment. Assessment and letter of approval faxed to [facility] Bariatric Surgery Program coordinator on 09/29/2012.   Handouts given during visit include:  Pre-Op Goals Bariatric Surgery Protein Shakes  Samples given during visit include:   [place] of the following:  LotOT:[id number]; Exp: 12/15 Lot: [id number]; Exp: 11/15 LotKY:[id number]; Exp: 12/15  Patient to call the [facility] to enroll in Pre-Op and Post-Op Nutrition Education when surgery date is scheduled.

## 2012-10-19 NOTE — Progress Notes (Addendum)
Chest x-ray 09/16/12 on EPIC, EKG 09/16/12 on EPIC, polysomnography 08/01/11 on chart, LOV note 09/16/11 Dr. Jeanmarie Plant on chart

## 2012-10-19 NOTE — Patient Instructions (Addendum)
[place]  10/19/2012   Your procedure is scheduled on: 11/02/12  Report to [facility] at 5:15 AM.  Call this number if you have problems the morning of surgery 336-: [phone]   Remember: please follow pre-op diet   Do not eat food or drink liquids After Midnight.     Take these medicines the morning of surgery with A SIP OF WATER: metoprolol, brintellix   Do not wear jewelry, make-up or nail polish.  Do not wear lotions, powders, or perfumes. You may wear deodorant.  Do not shave 48 hours prior to surgery. Men may shave face and neck.  Do not bring valuables to the hospital.  Contacts, dentures or bridgework may not be worn into surgery.  Leave suitcase in the car. After surgery it may be brought to your room.  For patients admitted to the hospital, checkout time is 11:00 AM the day of discharge.   Please read over the following fact sheets that you were given: Paulette Blanch, RN  pre op nurse call if needed [phone]    FAILURE TO [place]   Patient Signature: ___________________________________________

## 2012-10-19 NOTE — Progress Notes (Signed)
Need orders in EPIC.  Surgery scheduled for 11/02/12  Preop appointment on 9/30 at 0830am  Thank You.

## 2012-10-20 ENCOUNTER — Encounter (HOSPITAL_COMMUNITY): Payer: Self-pay

## 2012-10-20 ENCOUNTER — Encounter (HOSPITAL_COMMUNITY): Payer: Self-pay | Admitting: Pharmacy Technician

## 2012-10-20 ENCOUNTER — Encounter (HOSPITAL_COMMUNITY)
Admission: RE | Admit: 2012-10-20 | Discharge: 2012-10-20 | Disposition: A | Discharge status: Home / Self Care | Payer: BC Managed Care – PPO | Source: Ambulatory Visit | Attending: Surgery | Admitting: Surgery

## 2012-10-20 DIAGNOSIS — Z01812 Encounter for preprocedural laboratory examination: Secondary | ICD-10-CM | POA: Insufficient documentation

## 2012-10-20 DIAGNOSIS — Z01818 Encounter for other preprocedural examination: Secondary | ICD-10-CM | POA: Insufficient documentation

## 2012-10-20 HISTORY — DX: Unspecified ovarian cyst, unspecified side: N83.209

## 2012-10-20 HISTORY — DX: Depression, unspecified: F32.A

## 2012-10-20 HISTORY — DX: Other abnormalities of gait and mobility: R26.89

## 2012-10-20 HISTORY — DX: Anemia, unspecified: D64.9

## 2012-10-20 HISTORY — DX: Other lack of coordination: R27.8

## 2012-10-20 HISTORY — DX: Unspecified osteoarthritis, unspecified site: M19.90

## 2012-10-20 HISTORY — DX: Major depressive disorder, single episode, unspecified: F32.9

## 2012-10-20 LAB — BASIC METABOLIC PANEL
BUN: 35 mg/dL — ABNORMAL HIGH (ref 6–23)
CO2: 26 mEq/L (ref 19–32)
Calcium: 10.1 mg/dL (ref 8.4–10.5)
Chloride: 99 mEq/L (ref 96–112)
Creatinine, Ser: 1.14 mg/dL — ABNORMAL HIGH (ref 0.50–1.10)
GFR calc Af Amer: 59 mL/min — ABNORMAL LOW (ref >90)
GFR calc non Af Amer: 51 mL/min — ABNORMAL LOW (ref >90)
Glucose, Bld: 273 mg/dL — ABNORMAL HIGH (ref 70–99)
Potassium: 4.6 mEq/L (ref 3.5–5.1)
Sodium: 136 mEq/L (ref 135–145)

## 2012-10-20 LAB — CBC
HCT: 37.3 % (ref 36.0–46.0)
Hemoglobin: 12.8 g/dL (ref 12.0–15.0)
MCH: 29.2 pg (ref 26.0–34.0)
MCHC: 34.3 g/dL (ref 30.0–36.0)
MCV: 85 fL (ref 78.0–100.0)
Platelets: 265 10*3/uL (ref 150–400)
RBC: 4.39 MIL/uL (ref 3.87–5.11)
RDW: 13.5 % (ref 11.5–15.5)
WBC: 8.6 10*3/uL (ref 4.0–10.5)

## 2012-10-22 ENCOUNTER — Encounter: Payer: BC Managed Care – PPO | Attending: Surgery | Admitting: *Deleted

## 2012-10-22 DIAGNOSIS — Z713 Dietary counseling and surveillance: Secondary | ICD-10-CM | POA: Insufficient documentation

## 2012-10-29 ENCOUNTER — Encounter (INDEPENDENT_AMBULATORY_CARE_PROVIDER_SITE_OTHER): Payer: Self-pay | Admitting: Surgery

## 2012-10-29 ENCOUNTER — Ambulatory Visit (INDEPENDENT_AMBULATORY_CARE_PROVIDER_SITE_OTHER): Payer: BC Managed Care – PPO | Admitting: Surgery

## 2012-10-29 ENCOUNTER — Encounter (INDEPENDENT_AMBULATORY_CARE_PROVIDER_SITE_OTHER): Payer: Self-pay

## 2012-10-29 DIAGNOSIS — Z6841 Body Mass Index (BMI) 40.0 and over, adult: Secondary | ICD-10-CM

## 2012-10-29 DIAGNOSIS — E119 Type 2 diabetes mellitus without complications: Secondary | ICD-10-CM

## 2012-10-29 NOTE — Progress Notes (Signed)
Chief Complaint:  DM and morbid obesity for gastric bypass  History of Present Illness:  Darlene [name] is an 62 y.o. female who has had a lifelong obesity issues and who came initially  with her husband to discuss laparoscopic adjustable gastric bands. She has had problems with obesity most all of her life. She has tried numerous weight loss he experiences to try to help control her type 2 diabetes. At one point she lost 90 pounds. Her diabetes was greatly improved by that. Dr. Chalmers Cater wants her to have a lap roux en Y gastric bypass and we discussed this on a second office visit.   Her preop workup included an upper GI series which did not show a hiatus hernia. Ultrasound revealed some gallstones but no evidence of cholecystitis. I informed her of this and that we would plan to do just her gastric bypass and would observe her gallbladder. I entered questions and she is prepared for surgery next week.  Past Medical History  Diagnosis Date  . Obesity   . Hypertension   . Hyperlipidemia   . Obstructive sleep apnea on CPAP   . Diabetes mellitus without complication     [id number] years  . Eating disorder     binge eating  . Balance problem   . Arthritis     in fingers, shoulders  . Anemia     hx of  . Depression   . Ovarian cyst rupture     possible  . Clumsiness     Past Surgical History  Procedure Laterality Date  . Carpal tunnel release Right   . Carpal tunnel release Left   . Breath tek h pylori N/A 09/15/2012    Procedure: BREATH TEK H PYLORI;  Surgeon: Pedro Earls, MD;  Location: [facility];  Service: General;  Laterality: N/A;  . Dupuytren contracture release    . Trigger finger release      x3  . Toe surgery  1970s    bone spur  . Rotator cuff repair Right 2005    Current Outpatient Prescriptions  Medication Sig Dispense Refill  . acetaminophen (TYLENOL) 500 MG tablet Take 500 mg by mouth every 6 (six) hours as needed for pain.      [name] amLODipine (NORVASC) 10 MG  tablet Take 10 mg by mouth every morning.      [name] aspirin 81 MG tablet Take 81 mg by mouth daily.        . cholecalciferol (VITAMIN D) 400 UNITS TABS tablet Take 400 Units by mouth daily.      . ferrous fumarate (HEMOCYTE - 106 MG FE) 325 (106 FE) MG TABS tablet Take 1 tablet by mouth daily.      [name] ibuprofen (ADVIL,MOTRIN) 200 MG tablet Take 200 mg by mouth every 6 (six) hours as needed for pain.      [name] insulin aspart protamine-insulin aspart (NOVOLOG 70/30) (70-30) 100 UNIT/ML injection Inject 20-40 Units into the skin 3 (three) times daily with meals.       [name] losartan-hydrochlorothiazide (HYZAAR) 100-25 MG per tablet Take 1 tablet by mouth every morning.       . metFORMIN (GLUCOPHAGE) 1000 MG tablet Take 1,000 mg by mouth 2 (two) times daily with a meal.        . metoprolol (TOPROL-XL) 200 MG 24 hr tablet Take 200 mg by mouth every morning.       . mometasone (NASONEX) 50 MCG/ACT nasal spray Place 2 sprays into the nose daily  as needed.        . Multiple Vitamin (MULTIVITAMIN WITH MINERALS) TABS tablet Take 1 tablet by mouth daily.      . pramipexole (MIRAPEX) 0.125 MG tablet Take 0.125 mg by mouth at bedtime.       . simvastatin (ZOCOR) 20 MG tablet Take 20 mg by mouth at bedtime.        . Vortioxetine HBr (BRINTELLIX) 10 MG TABS Take 10 mg by mouth every morning.        No current facility-administered medications for this visit.   Other Family History  Problem Relation Age of Onset  . CVA Mother   . Diabetes Father   . Heart failure Father   . Hypertension Sister   . Diabetes Brother    Social History:   reports that she has never smoked. She has never used smokeless tobacco. She reports that she does not drink alcohol or use illicit drugs.   REVIEW OF SYSTEMS - PERTINENT POSITIVES ONLY: Negative for DVT. She's never had any problems with her heart or GI issues.  Physical Exam:   Blood pressure 130/70, pulse 60, temperature 98.4 F (36.9 C), temperature source Temporal, resp.  rate 14, height 5\' 9"  (1.753 m), weight 274 lb 3.2 oz (124.376 kg), last menstrual period 10/22/2007. Body mass index is 40.47 kg/(m^2).  Gen:  WDWN white female NAD  Neurological: Alert and oriented to person, place, and time. Motor and sensory function is grossly intact  Head: Normocephalic and atraumatic.  Eyes: Conjunctivae are normal. Pupils are equal, round, and reactive to light. No scleral icterus.  Neck: Normal range of motion. Neck supple. No tracheal deviation or thyromegaly present.  Cardiovascular:  SR without murmurs or gallops.  No carotid bruits Respiratory: Effort normal.  No respiratory distress. No chest wall tenderness. Breath sounds normal.  No wheezes, rales or rhonchi.  Abdomen:  nontender GU: Musculoskeletal: Normal range of motion. Extremities are nontender. No cyanosis, edema or clubbing noted Lymphadenopathy: No cervical, preauricular, postauricular or axillary adenopathy is present Skin: Skin is warm and dry. No rash noted. No diaphoresis. No erythema. No pallor. Pscyh: Normal mood and affect. Behavior is normal. Judgment and thought content normal.   LABORATORY RESULTS: No results found for this or any previous visit (from the past 48 hour(s)).  RADIOLOGY RESULTS: No results found.  Problem List: Patient Active Problem List   Diagnosis Date Noted  . Depression 09/10/2012  . hyperlipidemia 09/10/2012  . Diabetes 09/03/2012  . Morbid obesity 09/03/2012    Assessment & Plan: Morbid obesity with BMI 40 and diabetes mellitus. Plan:  Lap roux en Y gastric bypass on Monday.      Matt B. Hassell Done, MD, [facility] [phone] beeper [phone]  10/29/2012 9:37 AM

## 2012-10-29 NOTE — Patient Instructions (Signed)
Two weeks prior to surgery  Go on the extremely low carb liquid diet One week prior to surgery  No aspirin products.  Tylenol is acceptable  Stop smoking 24 hours prior to surgery  No alcoholic beverages  Report fever greater than 100.5 or excessive nasal drainage suggesting infection  Continue bariatric preop diet  Perform bowel prep if ordered  Do not eat or drink anything after midnight the night before surgery  Do not take any medications except those instructed by the anesthesiologist Morning of surgery  Please arrive at the hospital at least 2 hours before your scheduled surgery time.  No makeup, fingernail polish or jewelry  Bring insurance cards with you  Bring your CPAP mask if you use this   

## 2012-10-30 ENCOUNTER — Other Ambulatory Visit (INDEPENDENT_AMBULATORY_CARE_PROVIDER_SITE_OTHER): Payer: Self-pay | Admitting: Surgery

## 2012-10-30 NOTE — Progress Notes (Signed)
Text paged Dr. Hassell Done for orders at 1445 10/30/12.

## 2012-11-02 ENCOUNTER — Inpatient Hospital Stay (HOSPITAL_COMMUNITY): Payer: BC Managed Care – PPO | Admitting: Certified Registered"

## 2012-11-02 ENCOUNTER — Inpatient Hospital Stay (HOSPITAL_COMMUNITY)
Admission: RE | Admit: 2012-11-02 | Discharge: 2012-11-05 | DRG: 288 | Disposition: A | Discharge status: Home / Self Care | Payer: BC Managed Care – PPO | Source: Ambulatory Visit | Attending: Surgery | Admitting: Surgery

## 2012-11-02 ENCOUNTER — Encounter (HOSPITAL_COMMUNITY)
Admission: RE | Disposition: A | Discharge status: Home / Self Care | Payer: Self-pay | Source: Ambulatory Visit | Attending: Surgery

## 2012-11-02 ENCOUNTER — Encounter (HOSPITAL_COMMUNITY): Payer: BC Managed Care – PPO | Admitting: Certified Registered"

## 2012-11-02 ENCOUNTER — Encounter (HOSPITAL_COMMUNITY): Payer: Self-pay | Admitting: *Deleted

## 2012-11-02 DIAGNOSIS — E119 Type 2 diabetes mellitus without complications: Secondary | ICD-10-CM

## 2012-11-02 DIAGNOSIS — I10 Essential (primary) hypertension: Secondary | ICD-10-CM | POA: Diagnosis present

## 2012-11-02 DIAGNOSIS — Z6841 Body Mass Index (BMI) 40.0 and over, adult: Secondary | ICD-10-CM

## 2012-11-02 DIAGNOSIS — F3289 Other specified depressive episodes: Secondary | ICD-10-CM | POA: Diagnosis present

## 2012-11-02 DIAGNOSIS — G4733 Obstructive sleep apnea (adult) (pediatric): Secondary | ICD-10-CM | POA: Diagnosis present

## 2012-11-02 DIAGNOSIS — Z794 Long term (current) use of insulin: Secondary | ICD-10-CM

## 2012-11-02 DIAGNOSIS — E785 Hyperlipidemia, unspecified: Secondary | ICD-10-CM | POA: Diagnosis present

## 2012-11-02 DIAGNOSIS — Z79899 Other long term (current) drug therapy: Secondary | ICD-10-CM

## 2012-11-02 DIAGNOSIS — F329 Major depressive disorder, single episode, unspecified: Secondary | ICD-10-CM | POA: Diagnosis present

## 2012-11-02 DIAGNOSIS — Z9884 Bariatric surgery status: Secondary | ICD-10-CM

## 2012-11-02 HISTORY — PX: GASTRIC ROUX-EN-Y: SHX5262

## 2012-11-02 LAB — CBC
HCT: 37 % (ref 36.0–46.0)
Hemoglobin: 12.8 g/dL (ref 12.0–15.0)
MCH: 29.6 pg (ref 26.0–34.0)
MCHC: 34.6 g/dL (ref 30.0–36.0)
MCV: 85.6 fL (ref 78.0–100.0)
Platelets: 251 10*3/uL (ref 150–400)
RBC: 4.32 MIL/uL (ref 3.87–5.11)
RDW: 13.6 % (ref 11.5–15.5)
WBC: 14.2 10*3/uL — ABNORMAL HIGH (ref 4.0–10.5)

## 2012-11-02 LAB — GLUCOSE, CAPILLARY
Glucose-Capillary: 202 mg/dL — ABNORMAL HIGH (ref 70–99)
Glucose-Capillary: 292 mg/dL — ABNORMAL HIGH (ref 70–99)
Glucose-Capillary: 220 mg/dL — ABNORMAL HIGH (ref 70–99)
Glucose-Capillary: 178 mg/dL — ABNORMAL HIGH (ref 70–99)

## 2012-11-02 LAB — HEMOGLOBIN A1C
Hgb A1c MFr Bld: 7.7 % — ABNORMAL HIGH (ref <5.7)
Mean Plasma Glucose: 174 mg/dL — ABNORMAL HIGH (ref <117)

## 2012-11-02 LAB — CREATININE, SERUM
Creatinine, Ser: 1.37 mg/dL — ABNORMAL HIGH (ref 0.50–1.10)
GFR calc Af Amer: 48 mL/min — ABNORMAL LOW (ref >90)
GFR calc non Af Amer: 41 mL/min — ABNORMAL LOW (ref >90)

## 2012-11-02 SURGERY — LAPAROSCOPIC ROUX-EN-Y GASTRIC
Anesthesia: General | Site: Abdomen

## 2012-11-02 MED ORDER — BUPIVACAINE-EPINEPHRINE 0.25% -1:200000 IJ SOLN
INTRAMUSCULAR | Status: AC
  Filled 2012-11-02: qty 1

## 2012-11-02 MED ORDER — HYDROMORPHONE HCL PF 1 MG/ML IJ SOLN
INTRAMUSCULAR | Status: AC
  Filled 2012-11-02: qty 1

## 2012-11-02 MED ORDER — TISSEEL VH 10 ML EX KIT
PACK | CUTANEOUS | Status: DC | PRN
  Administered 2012-11-02: 10 mL

## 2012-11-02 MED ORDER — INSULIN ASPART 100 UNIT/ML ~~LOC~~ SOLN
0.0000 [IU] | SUBCUTANEOUS | Status: DC
  Administered 2012-11-02: 4 [IU] via SUBCUTANEOUS
  Administered 2012-11-02: 11 [IU] via SUBCUTANEOUS
  Administered 2012-11-02: 7 [IU] via SUBCUTANEOUS
  Administered 2012-11-03: 13:00:00 via SUBCUTANEOUS
  Administered 2012-11-03 – 2012-11-04 (×9): 7 [IU] via SUBCUTANEOUS
  Administered 2012-11-04: 11 [IU] via SUBCUTANEOUS
  Administered 2012-11-04 – 2012-11-05 (×3): 7 [IU] via SUBCUTANEOUS
  Administered 2012-11-05: 4 [IU] via SUBCUTANEOUS

## 2012-11-02 MED ORDER — ROCURONIUM BROMIDE 100 MG/10ML IV SOLN
INTRAVENOUS | Status: DC | PRN
  Administered 2012-11-02: 30 mg via INTRAVENOUS
  Administered 2012-11-02 (×2): 10 mg via INTRAVENOUS
  Administered 2012-11-02: 20 mg via INTRAVENOUS
  Administered 2012-11-02: 10 mg via INTRAVENOUS

## 2012-11-02 MED ORDER — DEXTROSE 5 % IV SOLN
2.0000 g | INTRAVENOUS | Status: AC
  Administered 2012-11-02: 2 g via INTRAVENOUS
  Filled 2012-11-02: qty 2

## 2012-11-02 MED ORDER — MORPHINE SULFATE 10 MG/ML IJ SOLN
2.0000 mg | INTRAMUSCULAR | Status: DC | PRN
  Administered 2012-11-02 – 2012-11-03 (×5): 4 mg via INTRAVENOUS
  Filled 2012-11-02 (×4): qty 1

## 2012-11-02 MED ORDER — OXYCODONE HCL 5 MG/5ML PO SOLN: 5.0000 mg | Freq: Once | ORAL | Status: DC | PRN

## 2012-11-02 MED ORDER — UNJURY CHICKEN SOUP POWDER: 2.0000 [oz_av] | Freq: Four times a day (QID) | ORAL | Status: DC

## 2012-11-02 MED ORDER — MIDAZOLAM HCL 5 MG/5ML IJ SOLN
INTRAMUSCULAR | Status: DC | PRN
  Administered 2012-11-02: 2 mg via INTRAVENOUS

## 2012-11-02 MED ORDER — FENTANYL CITRATE 0.05 MG/ML IJ SOLN
INTRAMUSCULAR | Status: DC | PRN
  Administered 2012-11-02 (×3): 50 ug via INTRAVENOUS
  Administered 2012-11-02: 100 ug via INTRAVENOUS

## 2012-11-02 MED ORDER — TISSEEL VH 10 ML EX KIT
PACK | CUTANEOUS | Status: AC
  Filled 2012-11-02: qty 2

## 2012-11-02 MED ORDER — CHLORHEXIDINE GLUCONATE 0.12 % MT SOLN
15.0000 mL | Freq: Two times a day (BID) | OROMUCOSAL | Status: DC
  Administered 2012-11-02 – 2012-11-04 (×5): 15 mL via OROMUCOSAL
  Filled 2012-11-02 (×9): qty 15

## 2012-11-02 MED ORDER — BUPIVACAINE LIPOSOME 1.3 % IJ SUSP
20.0000 mL | Freq: Once | INTRAMUSCULAR | Status: AC
  Administered 2012-11-02: 20 mL
  Filled 2012-11-02: qty 20

## 2012-11-02 MED ORDER — UNJURY VANILLA POWDER
2.0000 [oz_av] | Freq: Four times a day (QID) | ORAL | Status: DC
  Administered 2012-11-04 (×2): 2 [oz_av] via ORAL

## 2012-11-02 MED ORDER — PROMETHAZINE HCL 25 MG/ML IJ SOLN
INTRAMUSCULAR | Status: AC
  Filled 2012-11-02: qty 1

## 2012-11-02 MED ORDER — MEPERIDINE HCL 50 MG/ML IJ SOLN: 6.2500 mg | INTRAMUSCULAR | Status: DC | PRN

## 2012-11-02 MED ORDER — EPHEDRINE SULFATE 50 MG/ML IJ SOLN
INTRAMUSCULAR | Status: DC | PRN
  Administered 2012-11-02 (×2): 10 mg via INTRAVENOUS

## 2012-11-02 MED ORDER — PROPOFOL 10 MG/ML IV BOLUS
INTRAVENOUS | Status: DC | PRN
  Administered 2012-11-02: 150 mg via INTRAVENOUS

## 2012-11-02 MED ORDER — LACTATED RINGERS IV SOLN
INTRAVENOUS | Status: DC | PRN
  Administered 2012-11-02 (×2): via INTRAVENOUS

## 2012-11-02 MED ORDER — INSULIN GLARGINE 100 UNIT/ML ~~LOC~~ SOLN
10.0000 [IU] | Freq: Every day | SUBCUTANEOUS | Status: DC
  Administered 2012-11-02 – 2012-11-03 (×2): 10 [IU] via SUBCUTANEOUS
  Filled 2012-11-02 (×4): qty 0.1

## 2012-11-02 MED ORDER — DEXTROSE 5 % IV SOLN
INTRAVENOUS | Status: AC
  Filled 2012-11-02 (×2): qty 1

## 2012-11-02 MED ORDER — INSULIN ASPART 100 UNIT/ML ~~LOC~~ SOLN
SUBCUTANEOUS | Status: AC
  Filled 2012-11-02: qty 1

## 2012-11-02 MED ORDER — ACETAMINOPHEN 160 MG/5ML PO SOLN
650.0000 mg | ORAL | Status: DC | PRN
  Filled 2012-11-02 (×2): qty 20.3

## 2012-11-02 MED ORDER — PROMETHAZINE HCL 25 MG/ML IJ SOLN
6.2500 mg | INTRAMUSCULAR | Status: DC | PRN
  Administered 2012-11-02: 6.25 mg via INTRAVENOUS

## 2012-11-02 MED ORDER — LIDOCAINE HCL (PF) 2 % IJ SOLN
INTRAMUSCULAR | Status: DC | PRN
  Administered 2012-11-02: 40 mg

## 2012-11-02 MED ORDER — DEXTROSE 5 % IV SOLN
2.0000 g | Freq: Once | INTRAVENOUS | Status: AC
  Administered 2012-11-02: 2 g via INTRAVENOUS

## 2012-11-02 MED ORDER — GLYCOPYRROLATE 0.2 MG/ML IJ SOLN
INTRAMUSCULAR | Status: DC | PRN
  Administered 2012-11-02: 0.6 mg via INTRAVENOUS

## 2012-11-02 MED ORDER — UNJURY CHOCOLATE CLASSIC POWDER
2.0000 [oz_av] | Freq: Four times a day (QID) | ORAL | Status: DC
  Administered 2012-11-04 – 2012-11-05 (×3): 2 [oz_av] via ORAL

## 2012-11-02 MED ORDER — ONDANSETRON HCL 4 MG/2ML IJ SOLN
4.0000 mg | INTRAMUSCULAR | Status: DC | PRN
  Administered 2012-11-02: 4 mg via INTRAVENOUS
  Filled 2012-11-02: qty 2

## 2012-11-02 MED ORDER — OXYCODONE HCL 5 MG PO TABS: 5.0000 mg | ORAL_TABLET | Freq: Once | ORAL | Status: DC | PRN

## 2012-11-02 MED ORDER — NEOSTIGMINE METHYLSULFATE 1 MG/ML IJ SOLN
INTRAMUSCULAR | Status: DC | PRN
  Administered 2012-11-02: 4 mg via INTRAVENOUS

## 2012-11-02 MED ORDER — HEPARIN SODIUM (PORCINE) 5000 UNIT/ML IJ SOLN
5000.0000 [IU] | Freq: Three times a day (TID) | INTRAMUSCULAR | Status: DC
Start: 2012-11-02 — End: 2012-11-05
  Administered 2012-11-02 – 2012-11-05 (×9): 5000 [IU] via SUBCUTANEOUS
  Filled 2012-11-02 (×13): qty 1

## 2012-11-02 MED ORDER — LACTATED RINGERS IR SOLN
Status: DC | PRN
  Administered 2012-11-02: 1000 mL

## 2012-11-02 MED ORDER — BIOTENE DRY MOUTH MT LIQD
15.0000 mL | Freq: Two times a day (BID) | OROMUCOSAL | Status: DC
  Administered 2012-11-02 – 2012-11-04 (×4): 15 mL via OROMUCOSAL

## 2012-11-02 MED ORDER — LABETALOL HCL 5 MG/ML IV SOLN
INTRAVENOUS | Status: DC | PRN
  Administered 2012-11-02 (×2): 5 mg via INTRAVENOUS

## 2012-11-02 MED ORDER — KCL IN DEXTROSE-NACL 20-5-0.45 MEQ/L-%-% IV SOLN
INTRAVENOUS | Status: DC
  Administered 2012-11-02 – 2012-11-05 (×6): via INTRAVENOUS
  Filled 2012-11-02 (×11): qty 1000

## 2012-11-02 MED ORDER — MORPHINE SULFATE 2 MG/ML IJ SOLN
INTRAMUSCULAR | Status: AC
  Administered 2012-11-02: 2 mg via INTRAMUSCULAR
  Filled 2012-11-02: qty 1

## 2012-11-02 MED ORDER — SODIUM CHLORIDE 0.9 % IJ SOLN
INTRAMUSCULAR | Status: AC
  Filled 2012-11-02: qty 10

## 2012-11-02 MED ORDER — ONDANSETRON HCL 4 MG/2ML IJ SOLN
INTRAMUSCULAR | Status: DC | PRN
  Administered 2012-11-02: 4 mg via INTRAMUSCULAR

## 2012-11-02 MED ORDER — HYDROMORPHONE HCL PF 1 MG/ML IJ SOLN
0.2500 mg | INTRAMUSCULAR | Status: DC | PRN
  Administered 2012-11-02 (×3): 0.5 mg via INTRAVENOUS

## 2012-11-02 MED ORDER — SUCCINYLCHOLINE CHLORIDE 20 MG/ML IJ SOLN
INTRAMUSCULAR | Status: DC | PRN
  Administered 2012-11-02: 100 mg via INTRAVENOUS

## 2012-11-02 MED ORDER — HEPARIN SODIUM (PORCINE) 5000 UNIT/ML IJ SOLN
5000.0000 [IU] | INTRAMUSCULAR | Status: AC
  Administered 2012-11-02: 5000 [IU] via SUBCUTANEOUS
  Filled 2012-11-02: qty 1

## 2012-11-02 SURGICAL SUPPLY — 64 items
APPLICATOR COTTON TIP 6IN STRL (MISCELLANEOUS) ×4 IMPLANT
APPLIER CLIP ROT 10 11.4 M/L (STAPLE)
BENZOIN TINCTURE PRP APPL 2/3 (GAUZE/BANDAGES/DRESSINGS) ×2 IMPLANT
BLADE SURG 15 STRL LF DISP TIS (BLADE) ×1 IMPLANT
BLADE SURG 15 STRL SS (BLADE) ×1
CABLE HIGH FREQUENCY MONO STRZ (ELECTRODE) ×2 IMPLANT
CANISTER SUCTION 2500CC (MISCELLANEOUS) ×2 IMPLANT
CLIP APPLIE ROT 10 11.4 M/L (STAPLE) IMPLANT
CLIP SUT LAPRA TY ABSORB (SUTURE) ×4 IMPLANT
DEVICE SUTURE ENDOST 10MM (ENDOMECHANICALS) ×4 IMPLANT
DISSECTOR BLUNT TIP ENDO 5MM (MISCELLANEOUS) IMPLANT
DRAIN PENROSE 18X1/4 LTX STRL (WOUND CARE) ×2 IMPLANT
DRAPE CAMERA CLOSED 9X96 (DRAPES) ×2 IMPLANT
GAUZE SPONGE 4X4 16PLY XRAY LF (GAUZE/BANDAGES/DRESSINGS) ×2 IMPLANT
GEL PDS (MISCELLANEOUS) ×2 IMPLANT
GLOVE BIOGEL M 7.0 STRL (GLOVE) ×6 IMPLANT
GLOVE BIOGEL M 8.0 STRL (GLOVE) ×4 IMPLANT
GLOVE BIOGEL PI IND STRL 7.5 (GLOVE) ×2 IMPLANT
GLOVE BIOGEL PI INDICATOR 7.5 (GLOVE) ×2
GOWN STRL REIN XL XLG (GOWN DISPOSABLE) ×8 IMPLANT
HANDLE STAPLE EGIA 4 XL (STAPLE) ×4 IMPLANT
HOVERMATT SINGLE USE (MISCELLANEOUS) ×2 IMPLANT
KIT BASIN OR (CUSTOM PROCEDURE TRAY) ×2 IMPLANT
KIT GASTRIC LAVAGE 34FR ADT (SET/KITS/TRAYS/PACK) ×2 IMPLANT
MARKER SKIN DUAL TIP RULER LAB (MISCELLANEOUS) ×2 IMPLANT
NEEDLE SPNL 22GX3.5 QUINCKE BK (NEEDLE) ×2 IMPLANT
NS IRRIG 1000ML POUR BTL (IV SOLUTION) ×2 IMPLANT
PACK CARDIOVASCULAR III (CUSTOM PROCEDURE TRAY) ×2 IMPLANT
RELOAD EGIA 45 MED/THCK PURPLE (STAPLE) ×6 IMPLANT
RELOAD EGIA 45 TAN VASC (STAPLE) ×2 IMPLANT
RELOAD EGIA 60 MED/THCK PURPLE (STAPLE) ×8 IMPLANT
RELOAD EGIA 60 TAN VASC (STAPLE) ×2 IMPLANT
RELOAD ENDO STITCH 2.0 (ENDOMECHANICALS) ×10
SCISSORS LAP 5X45 EPIX DISP (ENDOMECHANICALS) ×2 IMPLANT
SEALANT SURGICAL APPL DUAL CAN (MISCELLANEOUS) ×2 IMPLANT
SET IRRIG TUBING LAPAROSCOPIC (IRRIGATION / IRRIGATOR) ×2 IMPLANT
SHEARS CURVED HARMONIC AC 45CM (MISCELLANEOUS) ×2 IMPLANT
SLEEVE ADV FIXATION 12X100MM (TROCAR) ×4 IMPLANT
SLEEVE ADV FIXATION 5X100MM (TROCAR) ×2 IMPLANT
SLEEVE Z-THREAD 5X100MM (TROCAR) IMPLANT
SOLUTION ANTI FOG 6CC (MISCELLANEOUS) ×2 IMPLANT
SPONGE GAUZE 4X4 12PLY (GAUZE/BANDAGES/DRESSINGS) ×2 IMPLANT
STAPLER VISISTAT 35W (STAPLE) ×2 IMPLANT
STRIP CLOSURE SKIN 1/2X4 (GAUZE/BANDAGES/DRESSINGS) ×2 IMPLANT
STRIP PERI DRY VERITAS 45 (STAPLE) ×2 IMPLANT
STRIP PERI DRY VERITAS 60 (STAPLE) ×4 IMPLANT
SUT RELOAD ENDO STITCH 2 48X1 (ENDOMECHANICALS) ×5
SUT RELOAD ENDO STITCH 2.0 (ENDOMECHANICALS) ×5
SUT VIC AB 2-0 SH 27 (SUTURE) ×2
SUT VIC AB 2-0 SH 27X BRD (SUTURE) ×2 IMPLANT
SUT VIC AB 4-0 SH 18 (SUTURE) ×2 IMPLANT
SUTURE RELOAD END STTCH 2 48X1 (ENDOMECHANICALS) ×5 IMPLANT
SUTURE RELOAD ENDO STITCH 2.0 (ENDOMECHANICALS) ×5 IMPLANT
SYR 20CC LL (SYRINGE) ×4 IMPLANT
SYR 50ML LL SCALE MARK (SYRINGE) ×2 IMPLANT
TOWEL OR 17X26 10 PK STRL BLUE (TOWEL DISPOSABLE) ×4 IMPLANT
TOWEL OR NON WOVEN STRL DISP B (DISPOSABLE) ×2 IMPLANT
TRAY FOLEY CATH 14FRSI W/METER (CATHETERS) ×2 IMPLANT
TROCAR ADV FIXATION 12X100MM (TROCAR) ×2 IMPLANT
TROCAR BLADELESS OPT 5 100 (ENDOMECHANICALS) ×2 IMPLANT
TROCAR XCEL 12X100 BLDLESS (ENDOMECHANICALS) ×2 IMPLANT
TUBING CONNECTING 10 (TUBING) ×2 IMPLANT
TUBING ENDO SMARTCAP (MISCELLANEOUS) ×2 IMPLANT
TUBING FILTER THERMOFLATOR (ELECTROSURGICAL) ×2 IMPLANT

## 2012-11-02 NOTE — Progress Notes (Signed)
PACU NOTE:  EPIC removed LR from I=O flow sheet, RN unable to chart input in PACU.  150 mls received in PACU.

## 2012-11-02 NOTE — Anesthesia Preprocedure Evaluation (Addendum)
Anesthesia Evaluation  Patient identified by MRN, date of birth, ID band Patient awake    Reviewed: Allergy & Precautions, H&P , NPO status , Patient's Chart, lab work & pertinent test results, reviewed documented beta blocker date and time   Airway Mallampati: II TM Distance: >3 FB Neck ROM: Full    Dental  (+) Dental Advisory Given   Pulmonary neg pulmonary ROS, sleep apnea ,          Cardiovascular hypertension, Pt. on medications and Pt. on home beta blockers Rhythm:Regular Rate:Normal     Neuro/Psych PSYCHIATRIC DISORDERS Depression negative neurological ROS     GI/Hepatic negative GI ROS, Neg liver ROS,   Endo/Other  diabetes, Type 2, Oral Hypoglycemic Agents and Insulin DependentMorbid obesity  Renal/GU negative Renal ROS     Musculoskeletal negative musculoskeletal ROS (+)   Abdominal (+) + obese,   Peds  Hematology negative hematology ROS (+)   Anesthesia Other Findings   Reproductive/Obstetrics negative OB ROS                          Anesthesia Physical Anesthesia Plan  ASA: III  Anesthesia Plan: General   Post-op Pain Management:    Induction: Intravenous  Airway Management Planned: Oral ETT  Additional Equipment:   Intra-op Plan:   Post-operative Plan: Extubation in OR  Informed Consent: I have reviewed the patients History and Physical, chart, labs and discussed the procedure including the risks, benefits and alternatives for the proposed anesthesia with the patient or authorized representative who has indicated his/her understanding and acceptance.   Dental advisory given  Plan Discussed with: CRNA  Anesthesia Plan Comments:         Anesthesia Quick Evaluation

## 2012-11-02 NOTE — Interval H&P Note (Signed)
History and Physical Interval Note:  11/02/2012 7:22 AM  Darlene [name]  has presented today for surgery, with the diagnosis of morbid obesity   The various methods of treatment have been discussed with the patient and family. After consideration of risks, benefits and other options for treatment, the patient has consented to  Procedure(s): LAPAROSCOPIC ROUX-EN-Y GASTRIC (N/A) as a surgical intervention .  The patient's history has been reviewed, patient examined, no change in status, stable for surgery.  I have reviewed the patient's chart and labs.  Questions were answered to the patient's satisfaction.     MARTIN,MATTHEW B

## 2012-11-02 NOTE — Transfer of Care (Signed)
Immediate Anesthesia Transfer of Care Note  Patient: Darlene [name]  Procedure(s) Performed: Procedure(s) (LRB): LAPAROSCOPIC ROUX-EN-Y GASTRIC (N/A)  Patient Location: PACU  Anesthesia Type: General  Level of Consciousness: sedated, patient cooperative and responds to stimulation  Airway & Oxygen Therapy: Patient Spontanous Breathing and Patient connected to face mask oxgen  Post-op Assessment: Report given to PACU RN and Post -op Vital signs reviewed and stable  Post vital signs: Reviewed and stable  Complications: No apparent anesthesia complications

## 2012-11-02 NOTE — Preoperative (Addendum)
Beta Blockers   Reason not to administer Beta Blockers:Not Applicable, Took at [id number] 11/01/12

## 2012-11-02 NOTE — Op Note (Signed)
Surgeon: Althea Grimmer. Hassell Done, MD, FACS Asst:  Alphonsa Overall, MD, FACS; Madilyn Hook, DO-upper endoscopy Anesthesia: General endotracheal Drains: None  Procedure: Laparoscopic Roux en Y gastric bypass with 40 cm BP limb and 100 cm Roux limb, antecolic, antegastric, candy cane to the left.  Closure of Peterson's defect. Upper endoscopy.   Description of Procedure:  The patient was taken to OR 1 at [facility] and given general anesthesia.  The abdomen was prepped with PCMX and draped sterilely.  A time out was performed.    The operation began by identifying the ligament of Treitz. I measured 40 cm downstream and divided the bowel with a 6 cm Covidian stapler.  I sutured a Penrose drain along the Roux limb end.  I measured a 1 meter (100 cm) Roux limb and then placed the distal bowels to the BP limb side by side and performed a stapled jejunojejunostomy. The common defect was closed from either end with 4-0 Vicryl using the Endo Stitch. The mesenteric defect was closed with a running 2-0 silk using the Endo Stitch. Tisseel was applied to the suture line.  The omentum was divided with the harmonic scalpel.  The Nathanson retractor was inserted in the left lateral segment of liver was retracted. The foregut dissection ensued. I measured 5 cm along the lesser curvature and created a retrogastric. 2 firings of the stapler using a 6 cm purple Covidien cartridge began the pouch which then extended proximally to the cardia then using Peri-Strips on the purple loads. The Roux limb was then brought up through the massive omentum which had been divided. To avoid tension we left a little more of the candycane and found a point of least tension and which to create the anastomosis.  The Roux limb was then brought up with the candycane pointed left and a back row of sutures of 2-0 Vicryl were placed. I opened along the right side of each structure and inserted the 4.5 cm stapler to create the gastrojejunostomy. The common defect  was closed from either end with 2-0 Vicryl and a second row was placed anterior to that the Ewald tube acting as a stent across the anastomosis. The Penrose drain was removed. Peterson's defect was closed with 2-0 silk.   Endoscopy was performed by Dr. Lilyan Punt which showed no bleeding and no leaks of air.    The incisions were injected with Exparel and were closed with 4-0 Vicryl and steristrips  The patient was taken to the recovery room in satisfactory condition.  Matt B. Hassell Done, MD, FACS

## 2012-11-02 NOTE — H&P (View-Only) (Signed)
Chief Complaint:  DM and morbid obesity for gastric bypass  History of Present Illness:  Darlene [name] is an 62 y.o. female who has had a lifelong obesity issues and who came initially  with her husband to discuss laparoscopic adjustable gastric bands. She has had problems with obesity most all of her life. She has tried numerous weight loss he experiences to try to help control her type 2 diabetes. At one point she lost 90 pounds. Her diabetes was greatly improved by that. Dr. Chalmers Cater wants her to have a lap roux en Y gastric bypass and we discussed this on a second office visit.   Her preop workup included an upper GI series which did not show a hiatus hernia. Ultrasound revealed some gallstones but no evidence of cholecystitis. I informed her of this and that we would plan to do just her gastric bypass and would observe her gallbladder. I entered questions and she is prepared for surgery next week.  Past Medical History  Diagnosis Date  . Obesity   . Hypertension   . Hyperlipidemia   . Obstructive sleep apnea on CPAP   . Diabetes mellitus without complication     123456 years  . Eating disorder     binge eating  . Balance problem   . Arthritis     in fingers, shoulders  . Anemia     hx of  . Depression   . Ovarian cyst rupture     possible  . Clumsiness     Past Surgical History  Procedure Laterality Date  . Carpal tunnel release Right   . Carpal tunnel release Left   . Breath tek h pylori N/A 09/15/2012    Procedure: BREATH TEK H PYLORI;  Surgeon: Pedro Earls, MD;  Location: [facility];  Service: General;  Laterality: N/A;  . Dupuytren contracture release    . Trigger finger release      x3  . Toe surgery  1970s    bone spur  . Rotator cuff repair Right 2005    Current Outpatient Prescriptions  Medication Sig Dispense Refill  . acetaminophen (TYLENOL) 500 MG tablet Take 500 mg by mouth every 6 (six) hours as needed for pain.      [name] amLODipine (NORVASC) 10 MG  tablet Take 10 mg by mouth every morning.      [name] aspirin 81 MG tablet Take 81 mg by mouth daily.        . cholecalciferol (VITAMIN D) 400 UNITS TABS tablet Take 400 Units by mouth daily.      . ferrous fumarate (HEMOCYTE - 106 MG FE) 325 (106 FE) MG TABS tablet Take 1 tablet by mouth daily.      [name] ibuprofen (ADVIL,MOTRIN) 200 MG tablet Take 200 mg by mouth every 6 (six) hours as needed for pain.      [name] insulin aspart protamine-insulin aspart (NOVOLOG 70/30) (70-30) 100 UNIT/ML injection Inject 20-40 Units into the skin 3 (three) times daily with meals.       [name] losartan-hydrochlorothiazide (HYZAAR) 100-25 MG per tablet Take 1 tablet by mouth every morning.       . metFORMIN (GLUCOPHAGE) 1000 MG tablet Take 1,000 mg by mouth 2 (two) times daily with a meal.        . metoprolol (TOPROL-XL) 200 MG 24 hr tablet Take 200 mg by mouth every morning.       . mometasone (NASONEX) 50 MCG/ACT nasal spray Place 2 sprays into the nose daily  as needed.        . Multiple Vitamin (MULTIVITAMIN WITH MINERALS) TABS tablet Take 1 tablet by mouth daily.      . pramipexole (MIRAPEX) 0.125 MG tablet Take 0.125 mg by mouth at bedtime.       . simvastatin (ZOCOR) 20 MG tablet Take 20 mg by mouth at bedtime.        . Vortioxetine HBr (BRINTELLIX) 10 MG TABS Take 10 mg by mouth every morning.        No current facility-administered medications for this visit.   Other Family History  Problem Relation Age of Onset  . CVA Mother   . Diabetes Father   . Heart failure Father   . Hypertension Sister   . Diabetes Brother    Social History:   reports that she has never smoked. She has never used smokeless tobacco. She reports that she does not drink alcohol or use illicit drugs.   REVIEW OF SYSTEMS - PERTINENT POSITIVES ONLY: Negative for DVT. She's never had any problems with her heart or GI issues.  Physical Exam:   Blood pressure 130/70, pulse 60, temperature 98.4 F (36.9 C), temperature source Temporal, resp.  rate 14, height 5\' 9"  (1.753 m), weight 274 lb 3.2 oz (124.376 kg), last menstrual period 10/22/2007. Body mass index is 40.47 kg/(m^2).  Gen:  WDWN white female NAD  Neurological: Alert and oriented to person, place, and time. Motor and sensory function is grossly intact  Head: Normocephalic and atraumatic.  Eyes: Conjunctivae are normal. Pupils are equal, round, and reactive to light. No scleral icterus.  Neck: Normal range of motion. Neck supple. No tracheal deviation or thyromegaly present.  Cardiovascular:  SR without murmurs or gallops.  No carotid bruits Respiratory: Effort normal.  No respiratory distress. No chest wall tenderness. Breath sounds normal.  No wheezes, rales or rhonchi.  Abdomen:  nontender GU: Musculoskeletal: Normal range of motion. Extremities are nontender. No cyanosis, edema or clubbing noted Lymphadenopathy: No cervical, preauricular, postauricular or axillary adenopathy is present Skin: Skin is warm and dry. No rash noted. No diaphoresis. No erythema. No pallor. Pscyh: Normal mood and affect. Behavior is normal. Judgment and thought content normal.   LABORATORY RESULTS: No results found for this or any previous visit (from the past 48 hour(s)).  RADIOLOGY RESULTS: No results found.  Problem List: Patient Active Problem List   Diagnosis Date Noted  . Depression 09/10/2012  . hyperlipidemia 09/10/2012  . Diabetes 09/03/2012  . Morbid obesity 09/03/2012    Assessment & Plan: Morbid obesity with BMI 40 and diabetes mellitus. Plan:  Lap roux en Y gastric bypass on Monday.      Matt B. Hassell Done, MD, [facility] [phone] beeper [phone]  10/29/2012 9:37 AM

## 2012-11-02 NOTE — Progress Notes (Signed)
Blood sugar is 202  Called "Nolon Stalls" in OR  States anesthesia will not be her for 10 more minutes.  Pt taken on to Or

## 2012-11-02 NOTE — Anesthesia Postprocedure Evaluation (Signed)
Anesthesia Post Note  Patient: Darlene [name]  Procedure(s) Performed: Procedure(s) (LRB): LAPAROSCOPIC ROUX-EN-Y GASTRIC (N/A)  Anesthesia type: General  Patient location: PACU  Post pain: Pain level controlled  Post assessment: Post-op Vital signs reviewed  Last Vitals: BP 153/74  Pulse 95  Temp(Src) 36.4 C (Oral)  Resp 15  Ht 5\' 9"  (1.753 m)  Wt 271 lb 6 oz (123.095 kg)  BMI 40.06 kg/m2  SpO2 95%  LMP 10/22/2007  Post vital signs: Reviewed  Level of consciousness: sedated  Complications: No apparent anesthesia complications

## 2012-11-02 NOTE — Progress Notes (Signed)
RT delivered CPAP for usage to PT in room 1534. Unit is set up for Auto CPAP ( Max 20cm - Min 9cm). After setting up unit PT states she is nauseated- RT asked for PT to notify RN when nauseated has passed (did not apply CPAP due to possible aspiration)- RN aware.

## 2012-11-03 ENCOUNTER — Encounter (HOSPITAL_COMMUNITY): Payer: Self-pay | Admitting: Surgery

## 2012-11-03 ENCOUNTER — Inpatient Hospital Stay (HOSPITAL_COMMUNITY): Payer: BC Managed Care – PPO

## 2012-11-03 DIAGNOSIS — Z9884 Bariatric surgery status: Secondary | ICD-10-CM

## 2012-11-03 LAB — GLUCOSE, CAPILLARY
Glucose-Capillary: 202 mg/dL — ABNORMAL HIGH (ref 70–99)
Glucose-Capillary: 221 mg/dL — ABNORMAL HIGH (ref 70–99)
Glucose-Capillary: 222 mg/dL — ABNORMAL HIGH (ref 70–99)
Glucose-Capillary: 225 mg/dL — ABNORMAL HIGH (ref 70–99)
Glucose-Capillary: 229 mg/dL — ABNORMAL HIGH (ref 70–99)
Glucose-Capillary: 230 mg/dL — ABNORMAL HIGH (ref 70–99)
Glucose-Capillary: 257 mg/dL — ABNORMAL HIGH (ref 70–99)

## 2012-11-03 LAB — HEMOGLOBIN AND HEMATOCRIT, BLOOD
HCT: 35.9 % — ABNORMAL LOW (ref 36.0–46.0)
Hemoglobin: 12.1 g/dL (ref 12.0–15.0)

## 2012-11-03 LAB — CBC WITH DIFFERENTIAL/PLATELET
Basophils Absolute: 0 10*3/uL (ref 0.0–0.1)
Basophils Relative: 0 % (ref 0–1)
Eosinophils Absolute: 0.1 10*3/uL (ref 0.0–0.7)
Eosinophils Relative: 1 % (ref 0–5)
HCT: 37.8 % (ref 36.0–46.0)
Hemoglobin: 12.9 g/dL (ref 12.0–15.0)
Lymphocytes Relative: 27 % (ref 12–46)
Lymphs Abs: 2.3 10*3/uL (ref 0.7–4.0)
MCH: 29.5 pg (ref 26.0–34.0)
MCHC: 34.1 g/dL (ref 30.0–36.0)
MCV: 86.5 fL (ref 78.0–100.0)
Monocytes Absolute: 0.8 10*3/uL (ref 0.1–1.0)
Monocytes Relative: 10 % (ref 3–12)
Neutro Abs: 5.2 10*3/uL (ref 1.7–7.7)
Neutrophils Relative %: 62 % (ref 43–77)
Platelets: 231 10*3/uL (ref 150–400)
RBC: 4.37 MIL/uL (ref 3.87–5.11)
RDW: 13.6 % (ref 11.5–15.5)
WBC: 8.4 10*3/uL (ref 4.0–10.5)

## 2012-11-03 MED ORDER — MORPHINE SULFATE 2 MG/ML IJ SOLN
2.0000 mg | INTRAMUSCULAR | Status: DC | PRN
  Administered 2012-11-03: 2 mg via INTRAVENOUS
  Administered 2012-11-04: 4 mg via INTRAVENOUS
  Administered 2012-11-04: 2 mg via INTRAVENOUS
  Filled 2012-11-03: qty 1
  Filled 2012-11-03: qty 2
  Filled 2012-11-03: qty 1

## 2012-11-03 MED ORDER — IOHEXOL 300 MG/ML  SOLN
50.0000 mL | Freq: Once | INTRAMUSCULAR | Status: AC | PRN
  Administered 2012-11-03: 50 mL via ORAL

## 2012-11-03 MED ORDER — MORPHINE SULFATE 4 MG/ML IJ SOLN
INTRAMUSCULAR | Status: AC
  Filled 2012-11-03: qty 1

## 2012-11-03 NOTE — Progress Notes (Signed)
Bilateral lower extremity venous duplex:  No evidence of DVT, superficial thrombosis, or Baker's Cyst.   

## 2012-11-03 NOTE — Progress Notes (Signed)
Patient alert and oriented. Most recent vital signs: T = 98.1, P = 84, R = 20, BP 150/62, SpO2 95%. Pt states she has been ambulating and utilizing the incentive spirometer. Denies nausea. Patient controlled.  Pt just finished Upper GI swallow study. Awaiting results; plan to advance to POD #1 gastric bypass diet (water), if swallow study normal.

## 2012-11-03 NOTE — Progress Notes (Signed)
Patient ID: Darlene [name], female   DOB: Jan 08, 1951, 62 y.o.   MRN: [id number] [facility] Progress Note:   1 Day Post-Op  Subjective: Mental status is clear Objective: Vital signs in last 24 hours: Temp:  [97 F (36.1 C)-99.3 F (37.4 C)] 98.1 F (36.7 C) (10/14 0542) Pulse Rate:  [83-97] 84 (10/14 0542) Resp:  [15-20] 20 (10/14 0542) BP: (139-188)/(51-75) 150/62 mmHg (10/14 0542) SpO2:  [93 %-100 %] 95 % (10/14 0542)  Intake/Output from previous day: 10/13 0701 - 10/14 0700 In: 2565 [I.V.:2565] Out: 1650 [Urine:1650] Intake/Output this shift: Total I/O In: -  Out: 200 [Urine:200]  Physical Exam: Work of breathing is normal :  Abdomen is nontender.    Lab Results:  Results for orders placed during the hospital encounter of 11/02/12 (from the past 48 hour(s))  GLUCOSE, CAPILLARY     Status: Abnormal   Collection Time    11/02/12  6:07 AM      Result Value Range   Glucose-Capillary 202 (*) 70 - 99 mg/dL   Comment 1 Notify RN    HEMOGLOBIN A1C     Status: Abnormal   Collection Time    11/02/12 12:30 PM      Result Value Range   Hemoglobin A1C 7.7 (*) <5.7 %   Comment: (NOTE)                                                                               According to the ADA Clinical Practice Recommendations for 2011, when     HbA1c is used as a screening test:      >=6.5%   Diagnostic of Diabetes Mellitus               (if abnormal result is confirmed)     5.7-6.4%   Increased risk of developing Diabetes Mellitus     References:Diagnosis and Classification of Diabetes Mellitus,Diabetes     [id number] 1):S62-S69 and Standards of Medical Care in             Diabetes - 2011,Diabetes Care,2011,34 (Suppl 1):S11-S61.   Mean Plasma Glucose 174 (*) <117 mg/dL   Comment: Performed at [facility]  CBC     Status: Abnormal   Collection Time    11/02/12 12:30 PM      Result Value Range   WBC 14.2 (*) 4.0 - 10.5 K/uL   RBC 4.32  3.87 - 5.11  MIL/uL   Hemoglobin 12.8  12.0 - 15.0 g/dL   HCT 37.0  36.0 - 46.0 %   MCV 85.6  78.0 - 100.0 fL   MCH 29.6  26.0 - 34.0 pg   MCHC 34.6  30.0 - 36.0 g/dL   RDW 13.6  11.5 - 15.5 %   Platelets 251  150 - 400 K/uL  CREATININE, SERUM     Status: Abnormal   Collection Time    11/02/12 12:30 PM      Result Value Range   Creatinine, Ser 1.37 (*) 0.50 - 1.10 mg/dL   GFR calc non Af Amer 41 (*) >90 mL/min   GFR calc Af Amer 48 (*) >90 mL/min   Comment: (NOTE)  The eGFR has been calculated using the CKD EPI equation.     This calculation has not been validated in all clinical situations.     eGFR's persistently <90 mL/min signify possible Chronic Kidney     Disease.  GLUCOSE, CAPILLARY     Status: Abnormal   Collection Time    11/02/12 12:30 PM      Result Value Range   Glucose-Capillary 292 (*) 70 - 99 mg/dL   Comment 1 Documented in Chart     Comment 2 Notify RN    GLUCOSE, CAPILLARY     Status: Abnormal   Collection Time    11/02/12  4:48 PM      Result Value Range   Glucose-Capillary 220 (*) 70 - 99 mg/dL  GLUCOSE, CAPILLARY     Status: Abnormal   Collection Time    11/02/12  7:59 PM      Result Value Range   Glucose-Capillary 178 (*) 70 - 99 mg/dL   Comment 1 Notify RN    GLUCOSE, CAPILLARY     Status: Abnormal   Collection Time    11/03/12 12:30 AM      Result Value Range   Glucose-Capillary 229 (*) 70 - 99 mg/dL  GLUCOSE, CAPILLARY     Status: Abnormal   Collection Time    11/03/12  3:48 AM      Result Value Range   Glucose-Capillary 230 (*) 70 - 99 mg/dL  CBC WITH DIFFERENTIAL     Status: None   Collection Time    11/03/12  5:44 AM      Result Value Range   WBC 8.4  4.0 - 10.5 K/uL   RBC 4.37  3.87 - 5.11 MIL/uL   Hemoglobin 12.9  12.0 - 15.0 g/dL   HCT 37.8  36.0 - 46.0 %   MCV 86.5  78.0 - 100.0 fL   MCH 29.5  26.0 - 34.0 pg   MCHC 34.1  30.0 - 36.0 g/dL   RDW 13.6  11.5 - 15.5 %   Platelets 231  150 - 400 K/uL   Neutrophils Relative % 62  43 - 77 %    Neutro Abs 5.2  1.7 - 7.7 K/uL   Lymphocytes Relative 27  12 - 46 %   Lymphs Abs 2.3  0.7 - 4.0 K/uL   Monocytes Relative 10  3 - 12 %   Monocytes Absolute 0.8  0.1 - 1.0 K/uL   Eosinophils Relative 1  0 - 5 %   Eosinophils Absolute 0.1  0.0 - 0.7 K/uL   Basophils Relative 0  0 - 1 %   Basophils Absolute 0.0  0.0 - 0.1 K/uL  GLUCOSE, CAPILLARY     Status: Abnormal   Collection Time    11/03/12  7:43 AM      Result Value Range   Glucose-Capillary 225 (*) 70 - 99 mg/dL    Radiology/Results: Dg Ugi W/water Sol Cm  11/03/2012   CLINICAL DATA:  Post gastric bypass.  EXAM: WATER SOLUBLE UPPER GI SERIES  TECHNIQUE: Single-column upper GI series was performed using water soluble contrast.  CONTRAST:  29mL OMNIPAQUE IOHEXOL 300 MG/ML  SOLN  COMPARISON:  09/16/2012.  FINDINGS: No delay of transit of contrast through the small gastric pouch.  No proximal anastomotic leak identified.  Delayed imaging reveals mild dilation of the proximal small bowel loops with poor delineation of the distal anastomosis.  IMPRESSION: No delay of transit of contrast through the small gastric  pouch.  No proximal anastomotic leak identified.  Delayed imaging reveals mild dilation of the proximal small bowel loops with poor delineation of the distal anastomosis.   Electronically Signed   By: Chauncey Cruel M.D.   On: 11/03/2012 11:07    Anti-infectives: Anti-infectives   Start     Dose/Rate Route Frequency Ordered Stop   11/02/12 0930  cefOXitin (MEFOXIN) 2 g in dextrose 5 % 50 mL IVPB     2 g 100 mL/hr over 30 Minutes Intravenous  Once 11/02/12 0927 11/02/12 0930   11/02/12 0554  cefOXitin (MEFOXIN) 2 g in dextrose 5 % 50 mL IVPB     2 g 100 mL/hr over 30 Minutes Intravenous On call to O.R. 11/02/12 0554 11/02/12 0730      Assessment/Plan: Problem List: Patient Active Problem List   Diagnosis Date Noted  . 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 Depression 09/10/2012  . hyperlipidemia 09/10/2012  .  Diabetes 09/03/2012  . Morbid obesity 09/03/2012    Not nauseated after UGI.  Will start PD 1 clears.    1 Day Post-Op    LOS: 1 day   Matt B. Hassell Done, MD, [facility] [phone] beeper [phone]  11/03/2012 11:35 AM

## 2012-11-03 NOTE — Progress Notes (Signed)
Inpatient Diabetes Program Recommendations  AACE/ADA: New Consensus Statement on Inpatient Glycemic Control (2013)  Target Ranges:  Prepandial:   less than 140 mg/dL      Peak postprandial:   less than 180 mg/dL (1-2 hours)      Critically ill patients:  140 - 180 mg/dL  Results for DELNA, COMBES (MRN [id number]) as of 11/03/2012 14:29  Ref. Range 11/02/2012 16:48 11/02/2012 19:59 11/03/2012 00:30 11/03/2012 03:48 11/03/2012 07:43  Glucose-Capillary Latest Range: 70-99 mg/dL 220 (H) 178 (H) 229 (H) 230 (H) 225 (H)   Inpatient Diabetes Program Recommendations Insulin - Basal: consider increasing Lantus to 15 units Thank you  Raoul Pitch BSN, RN,CDE Inpatient Diabetes Coordinator [phone] (team pager)

## 2012-11-04 LAB — CBC WITH DIFFERENTIAL/PLATELET
Basophils Absolute: 0 10*3/uL (ref 0.0–0.1)
Basophils Relative: 0 % (ref 0–1)
Eosinophils Absolute: 0.3 10*3/uL (ref 0.0–0.7)
Eosinophils Relative: 3 % (ref 0–5)
HCT: 34.4 % — ABNORMAL LOW (ref 36.0–46.0)
Hemoglobin: 11.8 g/dL — ABNORMAL LOW (ref 12.0–15.0)
Lymphocytes Relative: 23 % (ref 12–46)
Lymphs Abs: 1.9 10*3/uL (ref 0.7–4.0)
MCH: 29.6 pg (ref 26.0–34.0)
MCHC: 34.3 g/dL (ref 30.0–36.0)
MCV: 86.4 fL (ref 78.0–100.0)
Monocytes Absolute: 0.8 10*3/uL (ref 0.1–1.0)
Monocytes Relative: 9 % (ref 3–12)
Neutro Abs: 5.3 10*3/uL (ref 1.7–7.7)
Neutrophils Relative %: 64 % (ref 43–77)
Platelets: 181 10*3/uL (ref 150–400)
RBC: 3.98 MIL/uL (ref 3.87–5.11)
RDW: 13.6 % (ref 11.5–15.5)
WBC: 8.3 10*3/uL (ref 4.0–10.5)

## 2012-11-04 LAB — GLUCOSE, CAPILLARY
Glucose-Capillary: 215 mg/dL — ABNORMAL HIGH (ref 70–99)
Glucose-Capillary: 260 mg/dL — ABNORMAL HIGH (ref 70–99)
Glucose-Capillary: 231 mg/dL — ABNORMAL HIGH (ref 70–99)
Glucose-Capillary: 229 mg/dL — ABNORMAL HIGH (ref 70–99)
Glucose-Capillary: 226 mg/dL — ABNORMAL HIGH (ref 70–99)

## 2012-11-04 MED ORDER — INSULIN GLARGINE 100 UNIT/ML ~~LOC~~ SOLN
15.0000 [IU] | Freq: Every day | SUBCUTANEOUS | Status: DC
  Administered 2012-11-04: 15 [IU] via SUBCUTANEOUS
  Filled 2012-11-04: qty 0.15

## 2012-11-04 MED ORDER — HYDROCODONE-ACETAMINOPHEN 7.5-325 MG/15ML PO SOLN: 10.0000 mL | ORAL | Status: DC | PRN

## 2012-11-04 NOTE — Progress Notes (Signed)
Patient alert and oriented, pain is controlled. Patient is tolerating fluids, plan to advance to protein shake today.  Reviewed Gastric Bypass discharge instructions with patient and patient is able to articulate understanding as evidenced by teach back.  Also provided information about BELT program, support groups, and [facility] outpatient pharmacy offerings.      GASTRIC BYPASS / [place] Instructions  These instructions are to help you care for yourself when you go home.  Call: If you have any problems.   Call [phone] and ask for the surgeon on call   If you need immediate assistance come to the ER at [facility]. Tell the ER staff that you are a new post-op gastric bypass or gastric sleeve patient   Signs and symptoms to report:   Severe vomiting or nausea o If you cannot handle clear liquids for longer than 1 day, call your surgeon    Abdominal pain which does not get better after taking your pain medication   Fever greater than 100.4 F and chills   Heart rate over 100 beats a minute   Trouble breathing   Chest pain    Redness, swelling, drainage, or foul odor at incision (surgical) sites    If your incisions open or pull apart   Swelling or pain in calf (lower leg)   Diarrhea (Loose bowel movements that happen often), frequent watery, uncontrolled bowel movements   Constipation, (no bowel movements for 3 days) if this happens:  o Take Milk of Magnesia, 2 tablespoons by mouth, 3 times a day for 2 days if needed o Stop taking Milk of Magnesia once you have had a bowel movement o Call your doctor if constipation continues Or o Take Miralax  (instead of Milk of Magnesia) following the label instructions o Stop taking Miralax once you have had a bowel movement o Call your doctor if constipation continues   Anything you think is "abnormal for you"   Normal side effects after surgery:   Unable to sleep at night or unable to concentrate   Irritability   Being tearful  (crying) or depressed These are common complaints, possibly related to your anesthesia, stress of surgery and change in lifestyle, that usually go away a few weeks after surgery.  If these feelings continue, call your medical doctor.  Wound Care: You may have surgical glue, steri-strips, or staples over your incisions after surgery   Surgical glue:  Looks like a clear film over your incisions and will wear off a little at a time   Steri-strips : Adhesive strips of tape over your incisions. You may notice a yellowish color on the skin under the steri-strips. This is used to make the   steri-strips stick better. Do not pull the steri-strips off - let them fall off   Staples: Staples may be removed before you leave the hospital o If you go home with staples, call [facility] at for an appointment with your surgeon's nurse to have staples removed 10 days after surgery, [phone]   Showering: You may shower two (2) days after your surgery unless your surgeon tells you differently o Wash gently around incisions with warm soapy water, rinse well, and gently pat dry  o If you have a drain (tube from your incision), you may need someone to hold this while you shower  o No tub baths until staples are removed and incisions are healed     Medications:   Medications should be liquid or  crushed if larger than the size of a dime   Extended release pills (medication that releases a little bit at a time through the day) should not be crushed   Depending on the size and number of medications you take, you may need to space (take a few throughout the day)/change the time you take your medications so that you do not over-fill your pouch (smaller stomach)   Make sure you follow-up with your primary care physician to make medication changes needed during rapid weight loss and life-style changes   If you have diabetes, follow up with the doctor that orders your diabetes medication(s) within one week after  surgery and check your blood sugar regularly.   Do not drive while taking narcotics (pain medications)   Do not take acetaminophen (Tylenol) and Roxicet or Lortab Elixir at the same time since these pain medications contain acetaminophen  Diet:                    First 2 Weeks  You will see the nutritionist about two (2) weeks after your surgery. The nutritionist will increase the types of foods you can eat if you are handling liquids well:   If you have severe vomiting or nausea and cannot handle clear liquids lasting longer than 1 day, call your surgeon  Protein Shake   Drink at least 2 ounces of shake 5-6 times per day   Each serving of protein shakes (usually 8 - 12 ounces) should have a minimum of:  o 15 grams of protein  o And no more than 5 grams of carbohydrate    Goal for protein each day: o Men = 80 grams per day o Women = 60 grams per day   Protein powder may be added to fluids such as non-fat milk or Lactaid milk or Soy milk (limit to 35 grams added protein powder per serving)  Hydration   Slowly increase the amount of water and other clear liquids as tolerated (See Acceptable Fluids)   Slowly increase the amount of protein shake as tolerated     Sip fluids slowly and throughout the day   May use sugar substitutes in small amounts (no more than 6 - 8 packets per day; i.e. Splenda)  Fluid Goal   The first goal is to drink at least 8 ounces of protein shake/drink per day (or as directed by the nutritionist); some examples of protein shakes are [employer], [employer], EAS Edge HP, and Unjury. See handout from pre-op Bariatric Education Class: o Slowly increase the amount of protein shake you drink as tolerated o You may find it easier to slowly sip shakes throughout the day o It is important to get your proteins in first   Your fluid goal is to drink 64 - 100 ounces of fluid daily o It may take a few weeks to build up to this   32 oz (or more) should be clear liquids   And    32 oz (or more) should be full liquids (see below for examples)   Liquids should not contain sugar, caffeine, or carbonation  Clear Liquids:   Water or Sugar-free flavored water (i.e. Fruit H2O, Propel)   Decaffeinated coffee or tea (sugar-free)   Crystal Lite, Wyler's Lite, Minute Maid Lite   Sugar-free Jell-O   Bouillon or broth   Sugar-free Popsicle:   *Less than 20 calories each; Limit 1 per day  Full Liquids: Protein Shakes/Drinks + 2 choices per day  of other full liquids   Full liquids must be: o No More Than 12 grams of Carbs per serving  o No More Than 3 grams of Fat per serving   Strained low-fat cream soup   Non-Fat milk   Fat-free Lactaid Milk   Sugar-free yogurt (Dannon Lite & Fit, Greek yogurt)      Vitamins and Minerals   Start 1 day after surgery unless otherwise directed by your surgeon   2 Chewable Multivitamin / Multimineral Supplement with iron (i.e. Centrum for Adults)   Vitamin B-12, 350 - 500 micrograms sub-lingual (place tablet under the tongue) each day   Chewable Calcium Citrate with Vitamin D-3 (Example: 3 Chewable Calcium Plus 600 with Vitamin D-3) o Take 500 mg three (3) times a day for a total of 1500 mg each day o Do not take all 3 doses of calcium at one time as it may cause constipation, and you can only absorb 500 mg  at a time  o Do not mix multivitamins containing iron with calcium supplements; take 2 hours apart o Do not substitute Tums (calcium carbonate) for your calcium   Menstruating women and those at risk for anemia (a blood disease that causes weakness) may need extra iron o Talk with your doctor to see if you need more iron   If you need extra iron: Total daily Iron recommendation (including Vitamins) is 50 to 100 mg Iron/day   Do not stop taking or change any vitamins or minerals until you talk to your nutritionist or surgeon   Your nutritionist and/or surgeon must approve all vitamin and mineral supplements   Activity and  Exercise: It is important to continue walking at home.  Limit your physical activity as instructed by your doctor.  During this time, use these guidelines:   Do not lift anything greater than ten (10) pounds for at least two (2) weeks   Do not go back to work or drive until Engineer, production says you can   You may have sex when you feel comfortable  o It is VERY important for female patients to use a reliable birth control method; fertility often increases after surgery  o Do not get pregnant for at least 18 months   Start exercising as soon as your doctor tells you that you can o Make sure your doctor approves any physical activity   Start with a simple walking program   Walk 5-15 minutes each day, 7 days per week.    Slowly increase until you are walking 30-45 minutes per day Consider joining our [place] program. [phone] or email [email or web address]   Special Instructions Things to remember:   Free counseling is available for you and your family through collaboration between [facility] and [place]. Please call [phone] and leave a message   Use your CPAP when sleeping if this applies to you   [facility] has a free Bariatric Surgery Support Group that meets monthly, the 3rd Thursday, 6 pm, [place] can see classes online at [email or web address]   It is very important to keep all follow up appointments with your surgeon, nutritionist, primary care physician, and behavioral health practitioner o After the first year, please follow up with your bariatric surgeon and nutritionist at least once a year in order to maintain best weight loss results [facility]: [place]: [phone] Bariatric Nurse Coordinator: [phone]

## 2012-11-04 NOTE — Progress Notes (Signed)
Patient ID: Darlene [name], female   DOB: 1950/12/25, 62 y.o.   MRN: [id number] [facility] Progress Note:   2 Days Post-Op  Subjective: Mental status is clear.  Feels like she needs to pass flatus Objective: Vital signs in last 24 hours: Temp:  [98.5 F (36.9 C)-99.8 F (37.7 C)] 98.7 F (37.1 C) (10/15 1000) Pulse Rate:  [100-107] 105 (10/15 1000) Resp:  [18] 18 (10/15 1000) BP: (117-170)/(67-84) 117/67 mmHg (10/15 1000) SpO2:  [90 %-99 %] 95 % (10/15 1000)  Intake/Output from previous day: 10/14 0701 - 10/15 0700 In: 2960 [P.O.:60; I.V.:2900] Out: 1800 [Urine:1800] Intake/Output this shift: Total I/O In: -  Out: 1090 [Urine:1090]  Physical Exam: Work of breathing is not labored or elevated O2 sat 96.  Abdomen is less sore  Lab Results:  Results for orders placed during the hospital encounter of 11/02/12 (from the past 48 hour(s))  HEMOGLOBIN A1C     Status: Abnormal   Collection Time    11/02/12 12:30 PM      Result Value Range   Hemoglobin A1C 7.7 (*) <5.7 %   Comment: (NOTE)                                                                               According to the ADA Clinical Practice Recommendations for 2011, when     HbA1c is used as a screening test:      >=6.5%   Diagnostic of Diabetes Mellitus               (if abnormal result is confirmed)     5.7-6.4%   Increased risk of developing Diabetes Mellitus     References:Diagnosis and Classification of Diabetes Mellitus,Diabetes     [id number] 1):S62-S69 and Standards of Medical Care in             Diabetes - 2011,Diabetes Care,2011,34 (Suppl 1):S11-S61.   Mean Plasma Glucose 174 (*) <117 mg/dL   Comment: Performed at [facility]  CBC     Status: Abnormal   Collection Time    11/02/12 12:30 PM      Result Value Range   WBC 14.2 (*) 4.0 - 10.5 K/uL   RBC 4.32  3.87 - 5.11 MIL/uL   Hemoglobin 12.8  12.0 - 15.0 g/dL   HCT 37.0  36.0 - 46.0 %   MCV 85.6  78.0 - 100.0 fL    MCH 29.6  26.0 - 34.0 pg   MCHC 34.6  30.0 - 36.0 g/dL   RDW 13.6  11.5 - 15.5 %   Platelets 251  150 - 400 K/uL  CREATININE, SERUM     Status: Abnormal   Collection Time    11/02/12 12:30 PM      Result Value Range   Creatinine, Ser 1.37 (*) 0.50 - 1.10 mg/dL   GFR calc non Af Amer 41 (*) >90 mL/min   GFR calc Af Amer 48 (*) >90 mL/min   Comment: (NOTE)     The eGFR has been calculated using the CKD EPI equation.     This calculation has not been validated in all clinical situations.     eGFR's persistently <90  mL/min signify possible Chronic Kidney     Disease.  GLUCOSE, CAPILLARY     Status: Abnormal   Collection Time    11/02/12 12:30 PM      Result Value Range   Glucose-Capillary 292 (*) 70 - 99 mg/dL   Comment 1 Documented in Chart     Comment 2 Notify RN    GLUCOSE, CAPILLARY     Status: Abnormal   Collection Time    11/02/12  4:48 PM      Result Value Range   Glucose-Capillary 220 (*) 70 - 99 mg/dL  GLUCOSE, CAPILLARY     Status: Abnormal   Collection Time    11/02/12  7:59 PM      Result Value Range   Glucose-Capillary 178 (*) 70 - 99 mg/dL   Comment 1 Notify RN    GLUCOSE, CAPILLARY     Status: Abnormal   Collection Time    11/03/12 12:30 AM      Result Value Range   Glucose-Capillary 229 (*) 70 - 99 mg/dL  GLUCOSE, CAPILLARY     Status: Abnormal   Collection Time    11/03/12  3:48 AM      Result Value Range   Glucose-Capillary 230 (*) 70 - 99 mg/dL  CBC WITH DIFFERENTIAL     Status: None   Collection Time    11/03/12  5:44 AM      Result Value Range   WBC 8.4  4.0 - 10.5 K/uL   RBC 4.37  3.87 - 5.11 MIL/uL   Hemoglobin 12.9  12.0 - 15.0 g/dL   HCT 37.8  36.0 - 46.0 %   MCV 86.5  78.0 - 100.0 fL   MCH 29.5  26.0 - 34.0 pg   MCHC 34.1  30.0 - 36.0 g/dL   RDW 13.6  11.5 - 15.5 %   Platelets 231  150 - 400 K/uL   Neutrophils Relative % 62  43 - 77 %   Neutro Abs 5.2  1.7 - 7.7 K/uL   Lymphocytes Relative 27  12 - 46 %   Lymphs Abs 2.3  0.7 - 4.0 K/uL    Monocytes Relative 10  3 - 12 %   Monocytes Absolute 0.8  0.1 - 1.0 K/uL   Eosinophils Relative 1  0 - 5 %   Eosinophils Absolute 0.1  0.0 - 0.7 K/uL   Basophils Relative 0  0 - 1 %   Basophils Absolute 0.0  0.0 - 0.1 K/uL  GLUCOSE, CAPILLARY     Status: Abnormal   Collection Time    11/03/12  7:43 AM      Result Value Range   Glucose-Capillary 225 (*) 70 - 99 mg/dL  GLUCOSE, CAPILLARY     Status: Abnormal   Collection Time    11/03/12 12:02 PM      Result Value Range   Glucose-Capillary 257 (*) 70 - 99 mg/dL  HEMOGLOBIN AND HEMATOCRIT, BLOOD     Status: Abnormal   Collection Time    11/03/12  3:49 PM      Result Value Range   Hemoglobin 12.1  12.0 - 15.0 g/dL   HCT 35.9 (*) 36.0 - 46.0 %  GLUCOSE, CAPILLARY     Status: Abnormal   Collection Time    11/03/12  4:01 PM      Result Value Range   Glucose-Capillary 222 (*) 70 - 99 mg/dL   Comment 1 Notify RN    GLUCOSE, CAPILLARY  Status: Abnormal   Collection Time    11/03/12  7:43 PM      Result Value Range   Glucose-Capillary 202 (*) 70 - 99 mg/dL  GLUCOSE, CAPILLARY     Status: Abnormal   Collection Time    11/03/12 11:44 PM      Result Value Range   Glucose-Capillary 221 (*) 70 - 99 mg/dL  GLUCOSE, CAPILLARY     Status: Abnormal   Collection Time    11/04/12  3:36 AM      Result Value Range   Glucose-Capillary 215 (*) 70 - 99 mg/dL  CBC WITH DIFFERENTIAL     Status: Abnormal   Collection Time    11/04/12  4:52 AM      Result Value Range   WBC 8.3  4.0 - 10.5 K/uL   RBC 3.98  3.87 - 5.11 MIL/uL   Hemoglobin 11.8 (*) 12.0 - 15.0 g/dL   HCT 34.4 (*) 36.0 - 46.0 %   MCV 86.4  78.0 - 100.0 fL   MCH 29.6  26.0 - 34.0 pg   MCHC 34.3  30.0 - 36.0 g/dL   RDW 13.6  11.5 - 15.5 %   Platelets 181  150 - 400 K/uL   Neutrophils Relative % 64  43 - 77 %   Neutro Abs 5.3  1.7 - 7.7 K/uL   Lymphocytes Relative 23  12 - 46 %   Lymphs Abs 1.9  0.7 - 4.0 K/uL   Monocytes Relative 9  3 - 12 %   Monocytes Absolute 0.8   0.1 - 1.0 K/uL   Eosinophils Relative 3  0 - 5 %   Eosinophils Absolute 0.3  0.0 - 0.7 K/uL   Basophils Relative 0  0 - 1 %   Basophils Absolute 0.0  0.0 - 0.1 K/uL  GLUCOSE, CAPILLARY     Status: Abnormal   Collection Time    11/04/12  7:44 AM      Result Value Range   Glucose-Capillary 260 (*) 70 - 99 mg/dL    Radiology/Results: Dg Ugi W/water Sol Cm  11/03/2012   CLINICAL DATA:  Post gastric bypass.  EXAM: WATER SOLUBLE UPPER GI SERIES  TECHNIQUE: Single-column upper GI series was performed using water soluble contrast.  CONTRAST:  66mL OMNIPAQUE IOHEXOL 300 MG/ML  SOLN  COMPARISON:  09/16/2012.  FINDINGS: No delay of transit of contrast through the small gastric pouch.  No proximal anastomotic leak identified.  Delayed imaging reveals mild dilation of the proximal small bowel loops with poor delineation of the distal anastomosis.  IMPRESSION: No delay of transit of contrast through the small gastric pouch.  No proximal anastomotic leak identified.  Delayed imaging reveals mild dilation of the proximal small bowel loops with poor delineation of the distal anastomosis.   Electronically Signed   By: Chauncey Cruel M.D.   On: 11/03/2012 11:07    Anti-infectives: Anti-infectives   Start     Dose/Rate Route Frequency Ordered Stop   11/02/12 0930  cefOXitin (MEFOXIN) 2 g in dextrose 5 % 50 mL IVPB     2 g 100 mL/hr over 30 Minutes Intravenous  Once 11/02/12 0927 11/02/12 0930   11/02/12 0554  cefOXitin (MEFOXIN) 2 g in dextrose 5 % 50 mL IVPB     2 g 100 mL/hr over 30 Minutes Intravenous On call to O.R. 11/02/12 0554 11/02/12 0730      Assessment/Plan: Problem List: Patient Active Problem List   Diagnosis Date Noted  .  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 Depression 09/10/2012  . hyperlipidemia 09/10/2012  . Diabetes 09/03/2012  . Morbid obesity 09/03/2012    Taking PO but not ready for discharge yet.   2 Days Post-Op    LOS: 2 days   Matt B. Hassell Done, MD,  [facility] [phone] beeper [phone]  11/04/2012 11:19 AM

## 2012-11-05 LAB — GLUCOSE, CAPILLARY
Glucose-Capillary: 195 mg/dL — ABNORMAL HIGH (ref 70–99)
Glucose-Capillary: 234 mg/dL — ABNORMAL HIGH (ref 70–99)
Glucose-Capillary: 240 mg/dL — ABNORMAL HIGH (ref 70–99)

## 2012-11-05 MED ORDER — HYDROCODONE-ACETAMINOPHEN 7.5-325 MG/15ML PO SOLN: 10.0000 mL | ORAL | Status: DC | PRN

## 2012-11-05 NOTE — Patient Instructions (Signed)
Follow:  Pre-Op Diet per MD 2 weeks prior to surgery  Phase 2- Liquids (clear/full) 2 weeks after surgery  Vitamin/Mineral/Calcium guidelines for purchasing bariatric supplements  Exercise guidelines pre and post-op per MD  [facility] 2 weeks post-op for diet advancement. Contact Sara Himmelrich at [email or web address] or [phone] as needed with questions/concerns.

## 2012-11-05 NOTE — Progress Notes (Signed)
  Pre-Operative Nutrition Class:  Appt start time: [id number]   End time:  1830.  Patient was seen on 10/22/12 for Pre-Operative Bariatric Surgery Education at [facility].   Surgery date: 11/16/12 Surgery type: RYGB Start weight at [facility]: 295.7 lbs (09/29/12) Weight today: 280.0 lbs  Goal weight: 150 lbs  TANITA  BODY COMP RESULTS  10/22/12   BMI (kg/m^2) 41.3   Fat Mass (lbs) 128.5   Fat Free Mass (lbs) 151.5   Total Body Water (lbs) 111.0   Samples given per MNT protocol. Patient educated on appropriate usage:  BariActiv Multivitamin  Lot # [id number]  Exp: 05/16   BariActiv Calcium Citrate  Lot # [id number]  Exp: 05/16   BariActiv Iron + Vit C  Lot # [id number]  Exp: 05/16   Celebrate Vitamins Multi-Complete  Lot # [id number]  Exp: 03/16   Celebrate Vitamins Calcium Citrate  Lot # [id number]  Exp: 01/16   Bariatric Advantage Sublingual B12  Lot # [id number]  Exp: 12/15   Unjury Protein Powder  Lot # [id number]  Exp: 11/15  The following the learning objectives were met by the patient during this course:  Identify Pre-Op Dietary Goals and will begin 2 weeks pre-operatively  Identify appropriate sources of fluids and proteins   State protein recommendations and appropriate sources pre and post-operatively  Identify Post-Operative Dietary Goals and will follow for 2 weeks post-operatively  Identify appropriate multivitamin and calcium sources  Describe the need for physical activity post-operatively and will follow MD recommendations  State when to call healthcare provider regarding medication questions or post-operative complications  Handouts given during class include:  Pre-Op Bariatric Surgery Diet Handout  Protein Shake Handout  Post-Op Bariatric Surgery Nutrition Handout  BELT Program Information Flyer  Support Group Information Flyer  [facility] Bariatric Supplements Price List  Follow-Up Plan: Patient will follow-up at  [facility] 2 weeks post operatively for diet advancement per MD.

## 2012-11-05 NOTE — Discharge Summary (Signed)
Physician Discharge Summary  Patient ID: Darlene [name] MRN: [id number] DOB/AGE: 05-08-1950 62 y.o.  Admit date: 11/02/2012 Discharge date: 11/05/2012  Admission Diagnoses:  Obesity and DM  Discharge Diagnoses:  same  Active Problems:   Lap Roux Y Gastric Bypass Oct 2014   Surgery:  Laparoscopic Roux en Y gastric bypass  Discharged Condition: improved  Hospital Course:   Had surgery.  UGI on PD 1 looked good.  Slowly advanced diet.  Not taking liquids well enough on PD for discharge.  Ready for discharge on PD 3.    Consults: none  Significant Diagnostic Studies: UGI    Discharge Exam: Blood pressure 153/82, pulse 104, temperature 97.6 F (36.4 C), temperature source Oral, resp. rate 18, height 5\' 9"  (1.753 m), weight 271 lb 6 oz (123.095 kg), last menstrual period 10/22/2007, SpO2 98.00%. Incisions OK with minimal pain  Disposition: 01-Home or Self Care  Discharge Orders   Future Appointments Provider Department Dept Phone   11/17/2012 3:30 PM [facility] Post-Op Class [facility] [place] [phone]   11/27/2012 3:10 PM Pedro Earls, MD [facility], [place]   06/29/2013 9:45 AM [place] [facility], [place]   Future Orders Complete By Expires   Call MD for:  persistant nausea and vomiting  As directed    Call MD for:  severe uncontrolled pain  As directed    Call MD for:  temperature >100.4  As directed    Diet - low sodium heart healthy  As directed    Discharge instructions  As directed    Comments:     May shower.  Follow bariatric diet as instructed   Increase activity slowly  As directed    No wound care  As directed        Medication List         acetaminophen 500 MG tablet  Commonly known as:  TYLENOL  Take 500 mg by mouth every 6 (six) hours as needed for pain.     amLODipine 10 MG tablet  Commonly known as:  NORVASC  Take 10 mg by mouth every  morning.     aspirin 81 MG tablet  Take 81 mg by mouth daily.     BRINTELLIX 10 MG Tabs  Generic drug:  Vortioxetine HBr  Take 10 mg by mouth every morning.     cholecalciferol 400 UNITS Tabs tablet  Commonly known as:  VITAMIN D  Take 400 Units by mouth daily.     ferrous fumarate 325 (106 FE) MG Tabs tablet  Commonly known as:  HEMOCYTE - 106 mg FE  Take 1 tablet by mouth daily.     HYDROcodone-acetaminophen 7.5-325 mg/15 ml solution  Commonly known as:  HYCET  Take 10 mLs by mouth every 3 (three) hours as needed.     ibuprofen 200 MG tablet  Commonly known as:  ADVIL,MOTRIN  Take 200 mg by mouth every 6 (six) hours as needed for pain.     insulin aspart protamine- aspart (70-30) 100 UNIT/ML injection  Commonly known as:  NOVOLOG MIX 70/30  Inject 20-40 Units into the skin 3 (three) times daily with meals.     losartan-hydrochlorothiazide 100-25 MG per tablet  Commonly known as:  HYZAAR  Take 1 tablet by mouth every morning.     metFORMIN 1000 MG tablet  Commonly known as:  GLUCOPHAGE  Take 1,000 mg by mouth 2 (two) times daily with a meal.  metoprolol 200 MG 24 hr tablet  Commonly known as:  TOPROL-XL  Take 200 mg by mouth every morning.     mometasone 50 MCG/ACT nasal spray  Commonly known as:  NASONEX  Place 2 sprays into the nose daily as needed.     multivitamin with minerals Tabs tablet  Take 1 tablet by mouth daily.     pramipexole 0.125 MG tablet  Commonly known as:  MIRAPEX  Take 0.125 mg by mouth at bedtime.     simvastatin 20 MG tablet  Commonly known as:  ZOCOR  Take 20 mg by mouth at bedtime.           Follow-up Information   Follow up with Pedro Earls, MD.   Specialty:  General Surgery   Contact information:   [address] [place] [address] [phone]       Signed: Pedro Earls 11/05/2012, 8:13 AM

## 2012-11-19 NOTE — Progress Notes (Signed)
Bariatric Class:  Appt start time: 1530 end time:  1630.  2 Week Post-Operative Nutrition Class  Patient was seen on 11/17/12 for Post-Operative Nutrition education at the [facility].   Surgery date: 11/02/12 Surgery type: RYGB  Weight today: 256.5 lbs   TANITA  BODY COMP RESULTS  11/17/12   BMI (kg/m^2) 37.9   Fat Mass (lbs) 124.5   Fat Free Mass (lbs) 132.0   Total Body Water (lbs) 96.5   The following the learning objectives were met by the patient during this course:  Identifies Phase 3A (Soft, High Proteins) Dietary Goals and will begin from 2 weeks post-operatively to 2 months post-operatively  Identifies appropriate sources of fluids and proteins   States protein recommendations and appropriate sources post-operatively  Identifies the need for appropriate texture modifications, mastication, and bite sizes when consuming solids  Identifies appropriate multivitamin and calcium sources post-operatively  Describes the need for physical activity post-operatively and will follow MD recommendations  States when to call healthcare provider regarding medication questions or post-operative complications  Handouts given during class include:  Phase 3A: Soft, High Protein Diet Handout   Follow-Up Plan: Patient will follow-up at [facility] in 6 weeks for 8 week post-op nutrition visit for diet advancement per MD.

## 2012-11-19 NOTE — Patient Instructions (Signed)
Goals:  Follow Phase 3A: Soft High Protein Phase  Eat 3-6 small meals/snacks, every 3-5 hrs  Increase lean protein foods to meet 60g goal  Increase fluid intake to 64oz +  Avoid drinking 15 minutes before, during and 30 minutes after eating  Aim for >30 min of physical activity daily per MD

## 2012-11-26 ENCOUNTER — Other Ambulatory Visit: Payer: Self-pay

## 2012-11-27 ENCOUNTER — Encounter (INDEPENDENT_AMBULATORY_CARE_PROVIDER_SITE_OTHER): Payer: Self-pay | Admitting: Surgery

## 2012-11-27 ENCOUNTER — Ambulatory Visit (INDEPENDENT_AMBULATORY_CARE_PROVIDER_SITE_OTHER): Payer: BC Managed Care – PPO | Admitting: Surgery

## 2012-11-27 VITALS — BP 130/70 | HR 100 | Temp 98.6°F | Resp 16 | Ht 69.0 in | Wt 254.6 lb

## 2012-11-27 DIAGNOSIS — Z9884 Bariatric surgery status: Secondary | ICD-10-CM

## 2012-11-27 NOTE — Patient Instructions (Signed)
Stay on diet as prescribed

## 2012-11-27 NOTE — Progress Notes (Signed)
Darlene [name] 62 y.o.  Body mass index is 37.58 kg/(m^2).  Patient Active Problem List   Diagnosis Date Noted  . 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 Depression 09/10/2012  . hyperlipidemia 09/10/2012  . Diabetes 09/03/2012  . Morbid obesity 09/03/2012    Allergies  Allergen Reactions  . Other Itching    Peanuts in large quantities     Past Surgical History  Procedure Laterality Date  . Carpal tunnel release Right   . Carpal tunnel release Left   . Breath tek h pylori N/A 09/15/2012    Procedure: BREATH TEK H PYLORI;  Surgeon: Pedro Earls, MD;  Location: [facility];  Service: General;  Laterality: N/A;  . Dupuytren contracture release    . Trigger finger release      x3  . Toe surgery  1970s    bone spur  . Rotator cuff repair Right 2005  . Gastric roux-en-y N/A 11/02/2012    Procedure: LAPAROSCOPIC ROUX-EN-Y GASTRIC;  Surgeon: Pedro Earls, MD;  Location: [facility];  Service: General;  Laterality: N/A;   Bartholome Bill, MD No diagnosis found.  Doing well after roux en y gastric bypass.  She is following the diet and is tolerating that fine.  Incisions are OK.  Dr. Chalmers Cater is following her diabetes.  Encouraged to begin lifting weights and walking more. Return 6 weeks Matt B. Hassell Done, MD, [facility] [phone] beeper [phone]  11/27/2012 3:56 PM

## 2012-12-23 ENCOUNTER — Telehealth (INDEPENDENT_AMBULATORY_CARE_PROVIDER_SITE_OTHER): Payer: Self-pay | Admitting: *Deleted

## 2012-12-23 NOTE — Telephone Encounter (Signed)
Spoke to Dr. Hassell Done who suggested patient try an enema.  Called patient to let her know and also to make sure she tries stool softner and drinking plenty of fluids.  Patient states understanding and agreeable with plan at this time.

## 2012-12-23 NOTE — Telephone Encounter (Signed)
Patient called to report that she is having continued issues with constipation. Patient denies being distended or abdominal pain.  Patient states it is at my rectum but won't come out.  Patient states this happens to her about every other day.  Patient states she has tried Miralax, Glycerin suppositories, and today tried Dulcolax stimulating suppositories.  I have paged Dr. Hassell Done at this time to ask for his suggestion.  Patient states she can't take the rectal pain anymore.  Explained to patient that as soon as I hear back from Dr. Hassell Done I will let her know his opinion.  Patient states understanding and agreeable at this time.

## 2012-12-29 ENCOUNTER — Ambulatory Visit: Payer: BC Managed Care – PPO | Admitting: *Deleted

## 2013-01-01 ENCOUNTER — Encounter: Payer: BC Managed Care – PPO | Attending: Surgery | Admitting: Dietician

## 2013-01-01 DIAGNOSIS — Z713 Dietary counseling and surveillance: Secondary | ICD-10-CM | POA: Insufficient documentation

## 2013-01-01 NOTE — Progress Notes (Signed)
  Follow-up visit:  8 Weeks Post-Operative RYGB Surgery  Medical Nutrition Therapy:  Appt start time: 0945 end time:  1015.  Primary concerns today: Post-operative Bariatric Surgery Nutrition Management. Darlene [name] is here for f/u today with a 8.5 lbs weight loss. States that she is having difficulty with bowel movements and feel like stool is "stuck" unless she uses a stool softener or enema. Tolerating all foods, trying carbs.   Surgery date: 11/02/12 Surgery type: RYGB Starting weight at [facility]: 295 lbs on 09/29/12  Weight today: 248.0 lbs Weight change:8.5 lbs lost, 9 lbs fat loss Total weight lost: 47 lbs   TANITA  BODY COMP RESULTS  11/17/12 01/01/13   BMI (kg/m^2) 37.9 36.6   Fat Mass (lbs) 124.5 115.0   Fat Free Mass (lbs) 132.0 133.0   Total Body Water (lbs) 96.5 97.5    Preferred Learning Style:   No preference indicated   Learning Readiness:   Ready  24-hr recall: B (AM): unjury shake in soy milk (28 g protein) Snk (AM): greek yogurt, beef stick (6-12 g protein) L (PM): 3-4 oz chili or 3 oz chicken strips or leftovers (21-28 g protein) Snk (PM): pretzels D (PM): 3 oz pork, chicken, or beef with green beans or zucchini (21-28 g protein) Snk (PM): none  Fluid intake: water, 1 cup decaf with no-sugar creamer (hazlenut), Crystal light (40-60 oz) Estimated total protein intake: 75-90 g protein    Medications: see list Supplementation: Taking  CBG monitoring: test fasting (ave 136 mg/dl) mid morning (150-160s mg/dl) and if "feels low" (70s) Last patient reported A1c: 6.7% 12/31/12  Using straws:Not usually, sometimes at work  Drinking while eating: No Hair loss: No Carbonated beverages: No N/V/D/C: constipation Dumping syndrome: No  Recent physical activity:  3 x week goes to gym - treadmill 30-35 minutes cardio and streching  Progress Towards Goal(s):  In progress.  Handouts given during visit include:  Phase 3B High Protein + Non Starchy  Vegetables  Supplements given during visit include:  Nectar Chocolate lot #[id number] exp 2/17  Premier Chocolate lot [id number] 07/30/13   Nutritional Diagnosis:  [place]-3.3 Overweight/obesity related to past poor dietary habits and physical inactivity as evidenced by patient w/ recent RYGB surgery following dietary guidelines for continued weight loss.    Intervention:  Nutrition education/diet advancement.  Teaching Method Utilized:  Visual Auditory  Barriers to learning/adherence to lifestyle change: none  Demonstrated degree of understanding via:  Teach Back   Monitoring/Evaluation:  Dietary intake, exercise, lap band fills, and body weight. Follow up in 1 onths for 20month post-op visit.

## 2013-01-01 NOTE — Patient Instructions (Signed)
Goals:  Follow Phase 3B: High Protein + Non-Starchy Vegetables  Eat 3-6 small meals/snacks, every 3-5 hrs  Increase lean protein foods to meet 60g goal  Increase fluid intake to 64oz +  Avoid drinking 15 minutes before, during and 30 minutes after eating  Aim for >30 min of physical activity daily  

## 2013-01-04 ENCOUNTER — Telehealth (INDEPENDENT_AMBULATORY_CARE_PROVIDER_SITE_OTHER): Payer: Self-pay | Admitting: General Surgery

## 2013-01-04 NOTE — Telephone Encounter (Signed)
Message copied by Flossie Buffy on Mon Jan 04, 2013  5:23 PM ------      Message from: Darcey Nora      Created: Fri Jan 01, 2013  1:16 PM      Contact: [phone]       Kendall,      Pt called wants to know if we can refer her to a GI dr. She would like to speak with You or Dr Hassell Done.       Thanks       sonya ------

## 2013-01-04 NOTE — Telephone Encounter (Signed)
LMOM asking pt to return my call so that I may receive more information as to wanting a referral to GI.  Id like to know what she would like the referral for as well as if she has ever seen any GI doctors before.

## 2013-01-06 ENCOUNTER — Telehealth (INDEPENDENT_AMBULATORY_CARE_PROVIDER_SITE_OTHER): Payer: Self-pay | Admitting: General Surgery

## 2013-01-06 NOTE — Telephone Encounter (Signed)
Spoke with pt to try and find out why she needed a referral to GI.  She explained that she has been having bad spells of constipation.  Thankfully she says that she received my voicemail earlier and hadn't called back because it has now subsided due to her taking metamucil and flaxseed oil.  She says it is now not a current problem but that she would let us know if anything changes.

## 2013-01-06 NOTE — Telephone Encounter (Signed)
Message copied by Flossie Buffy on Wed Jan 06, 2013 10:17 AM ------      Message from: Darcey Nora      Created: Fri Jan 01, 2013  1:16 PM      Contact: [phone]       Kendall,      Pt called wants to know if we can refer her to a GI dr. She would like to speak with You or Dr Hassell Done.       Thanks       sonya ------

## 2013-01-08 ENCOUNTER — Encounter (INDEPENDENT_AMBULATORY_CARE_PROVIDER_SITE_OTHER): Payer: BC Managed Care – PPO | Admitting: Surgery

## 2013-01-25 ENCOUNTER — Encounter: Payer: BC Managed Care – PPO | Attending: Surgery | Admitting: Dietician

## 2013-01-25 DIAGNOSIS — Z713 Dietary counseling and surveillance: Secondary | ICD-10-CM | POA: Insufficient documentation

## 2013-01-25 NOTE — Progress Notes (Signed)
  Follow-up visit:  12 Weeks Post-Operative RYGB Surgery  Medical Nutrition Therapy:  Appt start time: [id number] end time:  0445.  Primary concerns today: Post-operative Bariatric Surgery Nutrition Management. Darlene [name] is here for f/u today with a 7 lbs weight loss (11.5 lb fat loss). Working chewing and taking small bites. Not feeling as tempted to have carbs like pretzels.  Blood sugar has been around 122-125 mg/dl in the morning (well under 140 mg/dl).   Constipation is better now that she has added 1 capsule of flax seed oil per day instead or Metamuci and Stool Softener.  Surgery date: 11/02/12 Surgery type: RYGB Starting weight at [facility]: 295 lbs on 09/29/12  Weight today: 241 lbs  Weight change:7 lbs lost,11.5 lbs fat loss Total weight lost: 47 lbs   TANITA  BODY COMP RESULTS  11/17/12 01/01/13 01/25/13   BMI (kg/m^2) 37.9 36.6 35.6   Fat Mass (lbs) 124.5 115.0 103.5   Fat Free Mass (lbs) 132.0 133.0 137.5   Total Body Water (lbs) 96.5 97.5 100.5    Preferred Learning Style:   No preference indicated   Learning Readiness:   Ready  24-hr recall: B (AM): unjury shake in soy milk (28 g protein) Snk (AM): greek yogurt sometimes (12 g protein) L (PM): 3-4 oz chili or 3 oz chicken strips or leftovers (21-28 g protein) Snk (PM): sometimes, trying different things: string cheese, [place] jerky, yogurt D (PM): 3 oz pork, chicken, or beef with green beans or zucchini (21-28 g protein) Snk (PM): none  Fluid intake: water, 1 cup decaf, getting in 64 oz  Estimated total protein intake: 75-90 g protein    Medications: see list Supplementation: Taking  CBG monitoring: Blood sugar has been around 122-125 mg/dl in the morning (well under 140 mg/dl) had about 1 low blood sugar (75 mg/dl)  Last patient reported A1c: 6.7% 12/31/12  Using straws:No  Drinking while eating: No Hair loss: No Carbonated beverages: No N/V/D/C: constipation - taking 1 capsule of flaxseed oil per day instead of  metamucil and stool softner  Dumping syndrome: No  Recent physical activity: plans to start back to gym, hasn't been going during the holidays  Progress Towards Goal(s):  In progress.  Handouts given during visit include:  Phase 3B High Protein + Non Starchy Vegetables   Nutritional Diagnosis:  -3.3 Overweight/obesity related to past poor dietary habits and physical inactivity as evidenced by patient w/ recent RYGB surgery following dietary guidelines for continued weight loss.    Intervention:  Nutrition education/diet reinforcement.   Teaching Method Utilized:  Visual Auditory  Barriers to learning/adherence to lifestyle change: none  Demonstrated degree of understanding via:  Teach Back   Monitoring/Evaluation:  Dietary intake, exercise, lap band fills, and body weight. Follow up in 3 months for 6 month post-op visit.

## 2013-01-25 NOTE — Patient Instructions (Addendum)
Goals:  Follow Phase 3B: High Protein + Non-Starchy Vegetables  Eat 3-6 small meals/snacks, every 3-5 hrs  Increase lean protein foods to meet 60g goal  Aim to have about 3 oz of protein at meals, and 1-2 oz at snacks  If having carbs, limit them to 15 g at meal or snack times  Increase fluid intake to 64oz +  Avoid drinking 15 minutes before, during and 30 minutes after eating  Aim for >30 min of physical activity daily

## 2013-02-03 ENCOUNTER — Encounter (INDEPENDENT_AMBULATORY_CARE_PROVIDER_SITE_OTHER): Payer: BC Managed Care – PPO | Admitting: Surgery

## 2013-02-03 ENCOUNTER — Telehealth (INDEPENDENT_AMBULATORY_CARE_PROVIDER_SITE_OTHER): Payer: Self-pay

## 2013-02-03 NOTE — Telephone Encounter (Signed)
Called and left message for patient to call our office to r/s her appt w/Dr. Hassell Done due to a delay in surgery Dr. Hassell Done will not be in the office til after 4:00 pm.

## 2013-02-03 NOTE — Telephone Encounter (Signed)
Patient came in for her scheduled 2:00 appointment today.  I explained to patient that I called and left a voicemail.  Patient did get message but, she was already near by so she came on to the office anyway.  Patient advised that Dr. Hassell Done is delayed in surgery and her appointment need's to be rescheduled.  Patient has appt for 02/04/13 @ 3:15pm w/[facility].  Appt card gien to patient with date & time written on it.

## 2013-02-04 ENCOUNTER — Encounter (INDEPENDENT_AMBULATORY_CARE_PROVIDER_SITE_OTHER): Payer: Self-pay

## 2013-02-04 ENCOUNTER — Ambulatory Visit (INDEPENDENT_AMBULATORY_CARE_PROVIDER_SITE_OTHER): Payer: 59 | Admitting: Physician Assistant

## 2013-02-04 VITALS — BP 130/64 | HR 84 | Temp 98.4°F | Resp 15 | Ht 69.0 in | Wt 239.6 lb

## 2013-02-04 DIAGNOSIS — K912 Postsurgical malabsorption, not elsewhere classified: Secondary | ICD-10-CM

## 2013-02-04 DIAGNOSIS — Z9884 Bariatric surgery status: Secondary | ICD-10-CM

## 2013-02-04 NOTE — Patient Instructions (Signed)
Obtain your laboratory studies. Return in three months. Consider Colace for relief of occasional constipation symptoms. Contact us if you have abdominal pain or difficulty getting food down.

## 2013-02-04 NOTE — Progress Notes (Signed)
  HISTORY: Darlene [name] is a 63 y.o.female who received a Roux-en-Y Gastric Bypass in October 2014 by Dr. Hassell Done. She comes in today with 34 lbs total weight loss since surgery. She feels overall improvement in her health. She has no complaints of nausea or vomiting or abdominal pain. She does have a feeling of constipation which has been improved with what sounds like colace. She has no diarrhea. Her exercise had reduced primarily due to the holiday but she plans on re-instituting an exercise regimen in the coming weeks.  VITAL SIGNS: Filed Vitals:   02/04/13 1524  BP: 130/64  Pulse: 84  Temp: 98.4 F (36.9 C)  Resp: 15    PHYSICAL EXAM: Physical exam reveals a very well-appearing 63 y.o.female in no apparent distress Neurologic: Awake, alert, oriented Psych: Bright affect, conversant Respiratory: Breathing even and unlabored. No stridor or wheezing Abdomen: Soft, nontender, nondistended to palpation. Incisions well-healed. No incisional hernias. Extremities: Atraumatic, good range of motion.  ASSESMENT: 63 y.o.  female  s/p Laparoscopic Roux-en-Y Gastric Bypass.   PLAN: I've ordered laboratory studies for today. She has labs pending with Dr. Chalmers Cater in April. I've encouraged her to increase her physical activity as a means to help continue weight loss. I recommended colace to help her bowel symptoms. She'll return to see me in three months or sooner if needed.

## 2013-02-19 LAB — CBC
HCT: 33.6 % — ABNORMAL LOW (ref 36.0–46.0)
Hemoglobin: 11.2 g/dL — ABNORMAL LOW (ref 12.0–15.0)
MCH: 29.9 pg (ref 26.0–34.0)
MCHC: 33.3 g/dL (ref 30.0–36.0)
MCV: 89.6 fL (ref 78.0–100.0)
Platelets: 255 10*3/uL (ref 150–400)
RBC: 3.75 MIL/uL — ABNORMAL LOW (ref 3.87–5.11)
RDW: 14.5 % (ref 11.5–15.5)
WBC: 6.9 10*3/uL (ref 4.0–10.5)

## 2013-02-19 LAB — IRON AND TIBC
%SAT: 19 % — ABNORMAL LOW (ref 20–55)
Iron: 58 ug/dL (ref 42–145)
TIBC: 305 ug/dL (ref 250–470)
UIBC: 247 ug/dL (ref 125–400)

## 2013-02-19 LAB — COMPLETE METABOLIC PANEL WITH GFR
ALT: 36 U/L — ABNORMAL HIGH (ref 0–35)
AST: 23 U/L (ref 0–37)
Albumin: 3.9 g/dL (ref 3.5–5.2)
Alkaline Phosphatase: 68 U/L (ref 39–117)
BUN: 26 mg/dL — ABNORMAL HIGH (ref 6–23)
CO2: 25 mEq/L (ref 19–32)
Calcium: 9.5 mg/dL (ref 8.4–10.5)
Chloride: 104 mEq/L (ref 96–112)
Creat: 1.1 mg/dL (ref 0.50–1.10)
GFR, Est African American: 63 mL/min
GFR, Est Non African American: 55 mL/min — ABNORMAL LOW
Glucose, Bld: 168 mg/dL — ABNORMAL HIGH (ref 70–99)
Potassium: 4.3 mEq/L (ref 3.5–5.3)
Sodium: 140 mEq/L (ref 135–145)
Total Bilirubin: 0.9 mg/dL (ref 0.2–1.2)
Total Protein: 6.3 g/dL (ref 6.0–8.3)

## 2013-02-19 LAB — LIPID PANEL
Cholesterol: 115 mg/dL (ref 0–200)
HDL: 40 mg/dL (ref >39)
LDL Cholesterol: 52 mg/dL (ref 0–99)
Total CHOL/HDL Ratio: 2.9 Ratio
Triglycerides: 115 mg/dL (ref <150)
VLDL: 23 mg/dL (ref 0–40)

## 2013-02-19 LAB — HEMOGLOBIN A1C
Hgb A1c MFr Bld: 6.9 % — ABNORMAL HIGH (ref <5.7)
Mean Plasma Glucose: 151 mg/dL — ABNORMAL HIGH (ref <117)

## 2013-04-26 ENCOUNTER — Ambulatory Visit: Payer: 59 | Admitting: Dietician

## 2013-05-06 ENCOUNTER — Ambulatory Visit (INDEPENDENT_AMBULATORY_CARE_PROVIDER_SITE_OTHER): Payer: 59

## 2013-05-06 ENCOUNTER — Telehealth (INDEPENDENT_AMBULATORY_CARE_PROVIDER_SITE_OTHER): Payer: Self-pay | Admitting: General Surgery

## 2013-05-06 ENCOUNTER — Ambulatory Visit (INDEPENDENT_AMBULATORY_CARE_PROVIDER_SITE_OTHER): Payer: 59 | Admitting: Physician Assistant

## 2013-05-06 ENCOUNTER — Encounter (INDEPENDENT_AMBULATORY_CARE_PROVIDER_SITE_OTHER): Payer: Self-pay

## 2013-05-06 VITALS — BP 140/82 | HR 71 | Temp 98.2°F | Resp 16 | Ht 69.0 in | Wt 232.4 lb

## 2013-05-06 DIAGNOSIS — Z9884 Bariatric surgery status: Secondary | ICD-10-CM

## 2013-05-06 NOTE — Telephone Encounter (Signed)
LMOM for patient to call back and ask for Annie 

## 2013-05-06 NOTE — Progress Notes (Signed)
  HISTORY: Darlene [name] is a 63 y.o.female who received a Roux-en-Y Gastric Bypass in October 2014 by Dr. Hassell Done. She comes today with 7 lbs weight loss since her last visit three months ago. She reports no new issues since then. She continues with some neuropathy which is longstanding. She's had her medicines for DM and HTN adjusted and is very pleased with the results. She had labs drawn in April at her PCP office. We discussed results from labs drawn in February, which show improvement.  VITAL SIGNS: Filed Vitals:   05/06/13 1103  BP: 140/82  Pulse: 71  Temp: 98.2 F (36.8 C)  Resp: 16    PHYSICAL EXAM: Physical exam reveals a very well-appearing 63 y.o.female in no apparent distress Neurologic: Awake, alert, oriented Psych: Bright affect, conversant Respiratory: Breathing even and unlabored. No stridor or wheezing Abdomen: Soft, nontender, nondistended to palpation. Incisions well-healed. No incisional hernias. Extremities: Atraumatic, good range of motion.  ASSESMENT: 63 y.o.  female  s/p Laparoscopic Roux-en-Y Gastric Bypass.   PLAN: I encouraged her to increase her exercise and to be mindful of carb intake both for the sake of weight loss and her DM. She is continuing to see Clarise Cruz in Nutrition. We'll have her return to the office for her one year appointment in October. She is to have labs at her PCP just prior to this, and she said she will send them along.

## 2013-05-06 NOTE — Patient Instructions (Signed)
Return in October. Focus on good food choices as well as physical activity.

## 2013-06-01 ENCOUNTER — Encounter: Payer: Self-pay | Admitting: *Deleted

## 2013-06-02 ENCOUNTER — Encounter: Payer: Self-pay | Admitting: Neurology

## 2013-06-02 ENCOUNTER — Ambulatory Visit (INDEPENDENT_AMBULATORY_CARE_PROVIDER_SITE_OTHER): Payer: 59 | Admitting: Neurology

## 2013-06-02 VITALS — BP 140/75 | HR 78 | Resp 18 | Ht 69.0 in | Wt 237.0 lb

## 2013-06-02 DIAGNOSIS — Z9989 Dependence on other enabling machines and devices: Secondary | ICD-10-CM | POA: Insufficient documentation

## 2013-06-02 DIAGNOSIS — Z9884 Bariatric surgery status: Secondary | ICD-10-CM | POA: Insufficient documentation

## 2013-06-02 DIAGNOSIS — E1140 Type 2 diabetes mellitus with diabetic neuropathy, unspecified: Secondary | ICD-10-CM | POA: Insufficient documentation

## 2013-06-02 DIAGNOSIS — E119 Type 2 diabetes mellitus without complications: Secondary | ICD-10-CM

## 2013-06-02 DIAGNOSIS — E1149 Type 2 diabetes mellitus with other diabetic neurological complication: Secondary | ICD-10-CM

## 2013-06-02 DIAGNOSIS — G4733 Obstructive sleep apnea (adult) (pediatric): Secondary | ICD-10-CM | POA: Insufficient documentation

## 2013-06-02 DIAGNOSIS — Z794 Long term (current) use of insulin: Secondary | ICD-10-CM | POA: Insufficient documentation

## 2013-06-02 NOTE — Patient Instructions (Signed)

## 2013-06-02 NOTE — Progress Notes (Addendum)
[facility]   Provider:  Larey Seat, [place] D  Referring Provider: Bartholome Bill, MD Primary Care Physician:  Thressa Sheller, MD  Chief Complaint  Patient presents with  . New Evaluation    Room 11  . Sleep consult    HPI:  Darlene [name] is a 63 y.o. female  Is seen here as a 2  year  revisit  from Dr. Noah Delaine for compliance visit on CPAP.  Evaluated and was diagnosed with severe sleep apnea at an AHI of 56 at the [facility] in 2008. Her sleep study was split to be titrated to CPAP. She had used a full face mask since then and he had a retitration study on 7-12- 2013- this time at [facility] sleep upon Dr Chales Abrahams referral .  Began the study confirmed obstructive sleep apnea but with a lower AHI of 16.3 and in addition with multiple periodic limb movements that are aroused  the patient about 4.4 times per hour of sleep. her sleep study also documented an oxygen nadir of 83% with 82.2 minutes of desaturations at all below 90%.    She was sent home with an autotitrator,  since she just missed the SPLIT  qualifying  criteria.  She had reported that she continued to kick her husband and her sleep. The autotitration revealed best results at 14 cm water with 3 cm EPR - but she still endorsed an Epworth sleepiness score of 16 points in August 2013- After using an auto titrating machine. Pre study Epworth sleepiness score was 21/24 points.  In October 2014 she underwent a roux and Y weight loss surgery upon recommendation of her internist and endocrinologist . She lost 65 pounds since and has gained stamina and day time alertness back. She feels motivated and participates in a special exercise program at [facility]. The patient works for a Hotel manager site , Garment/textile technologist . She rises at 6 AM, wakes spontaneously , feels refreshed, not a caffeine drinker. No sodas .  Driving  to work for 30 minutes in [place], [place] at 8 AM and 5 PM . No  naps, 10 PM is bedtime , falls asleep promptly .   Today's compliance data showed that the patient is as per CPAP machine 7 hours and 47 minutes at night on average the residual AHI is 1.3 the machine is set at 12 cm water pressure now was 2 cm EPR. She does still have a high air leak. She is using a nasal mask now. She has a high air leak daily and  probably needs to be refitted. This may be due to weight loss.  Today's for sleepiness score was endorsed at 6 point and fatigue severity at 15 points, the geriatric depression scale was endorsed at 2 points.        Review of Systems: Out of a complete 14 system review, the patient complains of only the following symptoms, and all other reviewed systems are negative.  Epworth and FSS significantly reduced.   History   Social History  . Marital Status: Married    Spouse Name: [name]    Number of Children: 0  . Years of Education: Bachelor's   Occupational History  . Theatre stage manager    Social History Main Topics  . Smoking status: Never Smoker   . Smokeless tobacco: Never Used  . Alcohol Use: No  . Drug Use: No  . Sexual Activity: Yes    Birth Control/ Protection: Post-menopausal  Other Topics Concern  . Not on file   Social History Narrative   Patient is married [name]) and lives at home with her husband.   Patient does not have any children.   Patient is working for a [employer].   Patient has a Bachelor's degree.   Patient is right-handed.   Patient does not drink any caffeine.    Family History  Problem Relation Age of Onset  . CVA Mother   . Diabetes Father   . Heart failure Father   . Hypertension Sister   . Diabetes Brother     Past Medical History  Diagnosis Date  . Obesity   . Hypertension   . Hyperlipidemia   . Obstructive sleep apnea on CPAP   . Diabetes mellitus without complication     123456 years  . Eating disorder     binge eating  . Balance problem   . Arthritis     in fingers,  shoulders  . Anemia     hx of  . Depression   . Ovarian cyst rupture     possible  . Clumsiness     Past Surgical History  Procedure Laterality Date  . Carpal tunnel release Right   . Carpal tunnel release Left   . Breath tek h pylori N/A 09/15/2012    Procedure: BREATH TEK H PYLORI;  Surgeon: Pedro Earls, MD;  Location: [facility];  Service: General;  Laterality: N/A;  . Dupuytren contracture release    . Trigger finger release      x3  . Toe surgery  1970s    bone spur  . Rotator cuff repair Right 2005  . Gastric roux-en-y N/A 11/02/2012    Procedure: LAPAROSCOPIC ROUX-EN-Y GASTRIC;  Surgeon: Pedro Earls, MD;  Location: [facility];  Service: General;  Laterality: N/A;    Current Outpatient Prescriptions  Medication Sig Dispense Refill  . acetaminophen (TYLENOL) 500 MG tablet Take 500 mg by mouth every 6 (six) hours as needed for pain.      [name] amLODipine (NORVASC) 10 MG tablet Take 5 mg by mouth every morning.       [name] aspirin 81 MG tablet Take 81 mg by mouth daily.        . cyanocobalamin 500 MCG tablet Take 500 mcg by mouth. Every other day      . Docusate Sodium (COLACE PO) Take 1 capsule by mouth 3 (three) times daily.      . Flaxseed, Linseed, (GNP FLAX SEED OIL) 1000 MG CAPS Take 1 capsule by mouth daily.      [name] ibuprofen (ADVIL,MOTRIN) 200 MG tablet Take 200 mg by mouth every 6 (six) hours as needed for pain.      [name] insulin aspart protamine-insulin aspart (NOVOLOG 70/30) (70-30) 100 UNIT/ML injection Inject 15-25 Units into the skin 2 (two) times daily with a meal.       . losartan (COZAAR) 100 MG tablet 1 tablet daily.      . mometasone (NASONEX) 50 MCG/ACT nasal spray Place 2 sprays into the nose daily as needed.        . Multiple Vitamin (MULTIVITAMIN WITH MINERALS) TABS tablet Take 1 tablet by mouth daily.      . pramipexole (MIRAPEX) 0.125 MG tablet Take 0.125 mg by mouth at bedtime.       . simvastatin (ZOCOR) 10 MG tablet Take 10 mg by mouth daily.      .  TRUETEST TEST test strip       .  Vortioxetine HBr (BRINTELLIX) 10 MG TABS Take 10 mg by mouth every morning.        No current facility-administered medications for this visit.    Allergies as of 06/02/2013 - Review Complete 06/02/2013  Allergen Reaction Noted  . Other Itching 11/19/2010    Vitals: BP 140/75  Pulse 78  Resp 18  Ht 5\' 9"  (1.753 m)  Wt 237 lb (107.502 kg)  BMI 34.98 kg/m2  LMP 10/22/2007 Last Weight:  Wt Readings from Last 1 Encounters:  06/02/13 237 lb (107.502 kg)   Last Height:   Ht Readings from Last 1 Encounters:  06/02/13 5\' 9"  (1.753 m)    Physical exam:  General: The patient is awake, alert and appears not in acute distress. The patient is well groomed. Head: Normocephalic, atraumatic. Neck is supple. Mallampati 2, neck circumference: [id number] , no TMJ click , no delayed swallowing , no goiter.  Cardiovascular:  Regular rate and rhythm, without  murmurs or carotid bruit, and without distended neck veins. Respiratory: Lungs are clear to auscultation. Skin:  Without evidence of edema, or rash Trunk: BMI is still  elevated  ( 35 BMI ) and patient  has normal posture.  Neurologic exam : The patient is awake and alert, oriented to place and time.  Memory subjective   described as intact. There is a normal attention span & concentration ability. Speech is fluent without  dysarthria, dysphonia or aphasia. Mood and affect are appropriate.  Cranial nerves: Pupils are equal and briskly reactive to light. Funduscopic exam without  evidence of pallor or edema.  Extraocular movements  in vertical and horizontal planes intact and without nystagmus. Visual fields by finger perimetry are intact. Hearing to finger rub intact.  Facial sensation intact to fine touch. Facial motor strength is symmetric and tongue and uvula move midline.  Motor exam:   Normal tone and normal muscle bulk and symmetric normal strength in all extremities. Crepitation over both knees with ROM.  Arthralgic gait.   Sensory:  Fine touch, pinprick and vibration were reduced in all 10 toes.  Finger sensation is  normal.  Coordination: Rapid alternating movements in the fingers/hands is tested and normal. Finger-to-nose maneuver tested and normal without evidence of ataxia, dysmetria or tremor.  Gait and station: Patient walks without assistive device.  Assessment:  After physical and neurologic examination, review of laboratory studies, imaging, neurophysiology testing and pre-existing records, assessment is   1) OSA on CPAP with a very low residual AHI and excellent compliance.  Due to the recent weight loss , an auto set will be used to allow reduction in CPAP pressure as the weight loss continues. She will need a new mask fitting. She has an arthralgic gait , due to years of morbid obesity and has diabetic neuropathy in her feet.   Plan:  Treatment plan and additional workup : DME is [facility] , refitting for a nasal mask or pillow after weight loss.  Setting to be adjusted auto : 6  to 12 cm water. DME notified. RV with me or NP in 6 month.

## 2013-06-29 ENCOUNTER — Encounter: Payer: Self-pay | Admitting: Nurse Practitioner

## 2013-06-29 ENCOUNTER — Ambulatory Visit (INDEPENDENT_AMBULATORY_CARE_PROVIDER_SITE_OTHER): Payer: 59 | Admitting: Nurse Practitioner

## 2013-06-29 VITALS — BP 132/74 | HR 60 | Ht 69.0 in | Wt 228.0 lb

## 2013-06-29 DIAGNOSIS — Z0000 Encounter for general adult medical examination without abnormal findings: Secondary | ICD-10-CM

## 2013-06-29 DIAGNOSIS — Z780 Asymptomatic menopausal state: Secondary | ICD-10-CM

## 2013-06-29 DIAGNOSIS — Z01419 Encounter for gynecological examination (general) (routine) without abnormal findings: Secondary | ICD-10-CM

## 2013-06-29 DIAGNOSIS — Z1211 Encounter for screening for malignant neoplasm of colon: Secondary | ICD-10-CM

## 2013-06-29 LAB — POCT URINALYSIS DIPSTICK
Bilirubin, UA: NEGATIVE
Blood, UA: NEGATIVE
Glucose, UA: NEGATIVE
Ketones, UA: NEGATIVE
Leukocytes, UA: NEGATIVE
Nitrite, UA: NEGATIVE
Urobilinogen, UA: NEGATIVE
pH, UA: 6.5

## 2013-06-29 NOTE — Progress Notes (Signed)
Patient ID: Darlene [name], female   DOB: 10-30-1950, 63 y.o.   MRN: [id number] 63 y.o. [id number] Married Caucasian Fe here for annual exam.  Now on a new antidepressant med's and doing well.  Sleeps well.  Now with gastric bypass roux-en-Y since 10/2012 and doing quite well.  So far about 65 lbs weight loss. She is very pleased.  States husbands ED problems are getting worse despite med's and rarely SA.  No vaginal bleeding or spotting.  Patient's last menstrual period was 10/22/2007.          Sexually active: yes  The current method of family planning is post menopausal status.  Exercising:  Yes, BELT program after gastric bypass Smoker: no   Health Maintenance:  Pap: 06/04/2011 Negative with neg HR HPV  MMG: 11/29/2011 normal  Colonoscopy: 2004 normal Will get repeat at age 6, PCP is scheduling  BMD: never  TDaP: PCP maintains tetanus.  Labs:  HB:  PCP  Urine:  Trace protein    reports that she has never smoked. She has never used smokeless tobacco. She reports that she does not drink alcohol or use illicit drugs.  Past Medical History  Diagnosis Date  . Obesity   . Hypertension   . Hyperlipidemia   . Obstructive sleep apnea on CPAP   . Diabetes mellitus without complication     123456 years  . Eating disorder     binge eating  . Balance problem   . Arthritis     in fingers, shoulders  . Anemia     hx of  . Depression   . Ovarian cyst rupture     possible  . Clumsiness     Past Surgical History  Procedure Laterality Date  . Carpal tunnel release Right   . Carpal tunnel release Left   . Breath tek h pylori N/A 09/15/2012    Procedure: BREATH TEK H PYLORI;  Surgeon: Pedro Earls, MD;  Location: [facility];  Service: General;  Laterality: N/A;  . Dupuytren contracture release    . Trigger finger release      x3  . Toe surgery  1970s    bone spur  . Rotator cuff repair Right 2005  . Gastric roux-en-y N/A 11/02/2012    Procedure: LAPAROSCOPIC ROUX-EN-Y GASTRIC;   Surgeon: Pedro Earls, MD;  Location: [facility];  Service: General;  Laterality: N/A;    Current Outpatient Prescriptions  Medication Sig Dispense Refill  . acetaminophen (TYLENOL) 500 MG tablet Take 500 mg by mouth every 6 (six) hours as needed for pain.      [name] amLODipine (NORVASC) 10 MG tablet Take 10 mg by mouth every morning.       [name] aspirin 81 MG tablet Take 81 mg by mouth daily.        . cyanocobalamin 500 MCG tablet Take 500 mcg by mouth every other day.       [name] Sodium (COLACE PO) Take 1 capsule by mouth 3 (three) times daily.      . Flaxseed, Linseed, (GNP FLAX SEED OIL) 1000 MG CAPS Take 1 capsule by mouth daily.      [name] ibuprofen (ADVIL,MOTRIN) 200 MG tablet Take 200 mg by mouth every 6 (six) hours as needed for pain.      [name] insulin aspart protamine-insulin aspart (NOVOLOG 70/30) (70-30) 100 UNIT/ML injection Inject 15-25 Units into the skin 2 (two) times daily with a meal.       . losartan (COZAAR)  100 MG tablet Take 1 tablet by mouth daily.       . mometasone (NASONEX) 50 MCG/ACT nasal spray Place 2 sprays into the nose daily as needed.        . Multiple Vitamin (MULTIVITAMIN WITH MINERALS) TABS tablet Take 1 tablet by mouth daily.      . simvastatin (ZOCOR) 10 MG tablet Take 10 mg by mouth daily.      . TRUETEST TEST test strip       . Vortioxetine HBr (BRINTELLIX) 10 MG TABS Take 10 mg by mouth every morning.        No current facility-administered medications for this visit.    Family History  Problem Relation Age of Onset  . CVA Mother   . Diabetes Father   . Heart failure Father   . Hypertension Sister   . Diabetes Brother   . Diabetes Paternal Uncle     ROS:  Pertinent items are noted in HPI.  Otherwise, a comprehensive ROS was negative.  Exam:   BP 132/74  Pulse 60  Ht 5\' 9"  (1.753 m)  Wt 228 lb (103.42 kg)  BMI 33.65 kg/m2  LMP 10/22/2007 Height: 5\' 9"  (175.3 cm)  Ht Readings from Last 3 Encounters:  06/29/13 5\' 9"  (1.753 m)  06/02/13 5\' 9"   (1.753 m)  05/06/13 5\' 9"  (1.753 m)    General appearance: alert, cooperative and appears stated age Head: Normocephalic, without obvious abnormality, atraumatic Neck: no adenopathy, supple, symmetrical, trachea midline and thyroid normal to inspection and palpation Lungs: clear to auscultation bilaterally Breasts: normal appearance, no masses or tenderness Heart: regular rate and rhythm Abdomen: soft, non-tender; no masses,  no organomegaly Extremities: extremities normal, atraumatic, no cyanosis or edema Skin: Skin color, texture, turgor normal. No rashes or lesions Lymph nodes: Cervical, supraclavicular, and axillary nodes normal. No abnormal inguinal nodes palpated Neurologic: Grossly normal   Pelvic: External genitalia:  no lesions              Urethra:  normal appearing urethra with no masses, tenderness or lesions              Bartholin's and Skene's: normal                 Vagina: normal appearing vagina with normal color and discharge, no lesions              Cervix: anteverted              Pap taken: no Bimanual Exam:  Uterus:  normal size, contour, position, consistency, mobility, non-tender              Adnexa: no mass, fullness, tenderness               Rectovaginal: Confirms               Anus:  normal sphincter tone, no lesions  A:  Well Woman with normal exam  Postmenopausal  Gastric Roux-en-Y bypass 10/2012  Needs repeat colonoscopy  Endo biopsy with proliferative endo 01/2008 - took provera for awhile off for about 2 years.  P:   Reviewed health and wellness pertinent to exam  Pap smear not taken today  Referral made to Dr. Collene Mares  Mammogram is due and will schedule, will get BMD at same time -faxed request  Counseled on breast self exam, mammography screening, adequate intake of calcium and vitamin D, diet and exercise return annually or prn  An After Visit Summary was printed  and given to the patient.

## 2013-06-29 NOTE — Patient Instructions (Signed)

## 2013-07-05 NOTE — Progress Notes (Signed)
Encounter reviewed by Dr. Brook Silva.  

## 2013-07-14 ENCOUNTER — Telehealth: Payer: Self-pay | Admitting: *Deleted

## 2013-07-14 NOTE — Telephone Encounter (Signed)
Pt notified of Bone Density results per handwritten note on hard copy.  Pt is excited with news.    Hard copy sent to scan.

## 2013-07-29 ENCOUNTER — Encounter: Payer: Self-pay | Admitting: Neurology

## 2013-07-31 ENCOUNTER — Encounter: Payer: Self-pay | Admitting: Neurology

## 2013-11-08 ENCOUNTER — Other Ambulatory Visit: Payer: Self-pay | Admitting: Internal Medicine

## 2013-11-08 DIAGNOSIS — R740 Nonspecific elevation of levels of transaminase and lactic acid dehydrogenase [LDH]: Principal | ICD-10-CM

## 2013-11-08 DIAGNOSIS — R7401 Elevation of levels of liver transaminase levels: Secondary | ICD-10-CM

## 2013-11-08 DIAGNOSIS — R7402 Elevation of levels of lactic acid dehydrogenase (LDH): Secondary | ICD-10-CM

## 2013-11-22 ENCOUNTER — Encounter: Payer: Self-pay | Admitting: Nurse Practitioner

## 2013-11-22 ENCOUNTER — Ambulatory Visit
Admission: RE | Admit: 2013-11-22 | Discharge: 2013-11-22 | Disposition: A | Discharge status: Home / Self Care | Payer: 59 | Source: Ambulatory Visit | Attending: Internal Medicine | Admitting: Internal Medicine

## 2013-11-22 DIAGNOSIS — R7402 Elevation of levels of lactic acid dehydrogenase (LDH): Secondary | ICD-10-CM

## 2013-11-22 DIAGNOSIS — R740 Nonspecific elevation of levels of transaminase and lactic acid dehydrogenase [LDH]: Principal | ICD-10-CM

## 2013-11-22 DIAGNOSIS — R7401 Elevation of levels of liver transaminase levels: Secondary | ICD-10-CM

## 2013-12-08 ENCOUNTER — Encounter: Payer: Self-pay | Admitting: Neurology

## 2013-12-08 ENCOUNTER — Ambulatory Visit (INDEPENDENT_AMBULATORY_CARE_PROVIDER_SITE_OTHER): Payer: 59 | Admitting: Neurology

## 2013-12-08 VITALS — BP 127/58 | HR 65 | Temp 98.4°F | Resp 15 | Ht 69.0 in | Wt 224.0 lb

## 2013-12-08 DIAGNOSIS — Z9989 Dependence on other enabling machines and devices: Secondary | ICD-10-CM

## 2013-12-08 DIAGNOSIS — G4733 Obstructive sleep apnea (adult) (pediatric): Secondary | ICD-10-CM

## 2013-12-08 NOTE — Progress Notes (Signed)
[facility]   Provider:  Larey Seat, [place] D  Referring Provider: Thressa Sheller, MD Primary Care Physician:  Thressa Sheller, MD  Chief Complaint  Patient presents with  . Follow-up    OSA, rm 11    HPI:  Darlene [name] is a 63 y.o. female  Is seen here as a 2 year revisit from Dr. Tamala Fothergill  for compliance visit , the patient has sleep apnea and is on CPAP.  Evaluated and  diagnosed with severe sleep apnea at an AHI of 56 at the [facility] in 2008. Her sleep study was split to be titrated to CPAP.  She had used a full face mask since then and he had a retitration study on 7-12- 2013- this time at [facility] sleep upon Dr Chales Abrahams referral .  the study confirmed obstructive sleep apnea but with a lower AHI of 16.3 and in addition with multiple periodic limb movements that are aroused the patient about 4.4 times per hour of sleep. her sleep study also documented an oxygen nadir of 83% with 82.2 minutes of desaturations at all below 90%.  She was sent home with an autotitrator,  since she just missed the SPLIT  qualifying  criteria.  She uses a nasal pillow.  She had reported that she continued to kick her husband and her sleep. The autotitration revealed best results at 14 cm water with 3 cm EPR - but she still endorsed an Epworth sleepiness score of 16 points in August 2013- After using an auto titrating machine. Pre study Epworth sleepiness score was 21/24 points. In October 2014 she underwent a Roux and Y weight loss surgery upon recommendation of her internist and endocrinologist . She lost 65 pounds since and has gained stamina and day time alertness back. She feels motivated and participates in a special exercise program at [facility]. The patient works for a Hotel manager site , Garment/textile technologist . She rises at 6 AM, wakes spontaneously, feels refreshed, not a caffeine drinker. No sodas. Driving to work for 30 minutes in [place], [place] at  8 AM and 5 PM . No naps, 10 PM is bedtime , falls asleep promptly . compliance data showed that the patient is as per CPAP machine 7 hours and 47 minutes at night on average the residual AHI is 1.3 the machine is set at 12 cm water pressure now was 2 cm EPR. She does still have a high air leak. She is using a nasal pillow now. She has a high air leak daily and  probably needs to be refitted. This may be due to weight loss. sleepiness score was endorsed at 6 point and fatigue severity at 15 points, the geriatric depression scale was endorsed at 2 points.   12-08-13: Darlene [name] is on an autoset machine with a pressure window between 6 and 12 cm water and 3 cm EPR the 95% for pressure is 11.6 cm. Currently the machine is basically used  at 12 cm water. The patient has an AHI of 1.0, her air leak is moderate.  Epworth 4,  GDS zero, FSS 12 .   Review of Systems: Out of a complete 14 system review, the patient complains of only the following symptoms, and all other reviewed systems are negative. Epworth and FSS significantly reduced.  Losing weight , status post gastric bypass, 217 from 292 pounds.  Dr. Johnathan Hausen MD in [place]>   History   Social History  . Marital Status: Married  Spouse Name: [name]    Number of Children: 0  . Years of Education: Bachelor's   Occupational History  . Theatre stage manager    Social History Main Topics  . Smoking status: Never Smoker   . Smokeless tobacco: Never Used  . Alcohol Use: No  . Drug Use: No  . Sexual Activity: Yes    Birth Control/ Protection: Post-menopausal   Other Topics Concern  . Not on file   Social History Narrative   Patient is married [name]) and lives at home with her husband.   Patient does not have any children.   Patient is working for a [employer].   Patient has a Bachelor's degree.   Patient is right-handed.   Patient does not drink any caffeine.    Family History  Problem Relation Age of Onset  . CVA  Mother   . Diabetes Father   . Heart failure Father   . Hypertension Sister   . Diabetes Brother   . Diabetes Paternal Uncle     Past Medical History  Diagnosis Date  . Obesity   . Hypertension   . Hyperlipidemia   . Obstructive sleep apnea on CPAP   . Diabetes mellitus without complication     123456 years  . Eating disorder     binge eating  . Balance problem   . Arthritis     in fingers, shoulders  . Anemia     hx of  . Depression   . Ovarian cyst rupture     possible  . Clumsiness     Past Surgical History  Procedure Laterality Date  . Carpal tunnel release Right   . Carpal tunnel release Left   . Breath tek h pylori N/A 09/15/2012    Procedure: BREATH TEK H PYLORI;  Surgeon: Pedro Earls, MD;  Location: [facility];  Service: General;  Laterality: N/A;  . Dupuytren contracture release    . Trigger finger release      x3  . Toe surgery  1970s    bone spur  . Rotator cuff repair Right 2005  . Gastric roux-en-y N/A 11/02/2012    Procedure: LAPAROSCOPIC ROUX-EN-Y GASTRIC;  Surgeon: Pedro Earls, MD;  Location: [facility];  Service: General;  Laterality: N/A;    Current Outpatient Prescriptions  Medication Sig Dispense Refill  . acetaminophen (TYLENOL) 500 MG tablet Take 500 mg by mouth every 6 (six) hours as needed for pain.    [name] amLODipine (NORVASC) 2.5 MG tablet     . aspirin 81 MG tablet Take 81 mg by mouth daily.      . cyanocobalamin 500 MCG tablet Take 500 mcg by mouth every other day.     [name] Sodium (COLACE PO) Take 1 capsule by mouth 3 (three) times daily.    [name] HUMALOG MIX 75/25 (75-25) 100 UNIT/ML SUSP injection 10-12 twice daily    . ibuprofen (ADVIL,MOTRIN) 200 MG tablet Take 200 mg by mouth every 6 (six) hours as needed for pain.    [name] losartan (COZAAR) 100 MG tablet Take 1 tablet by mouth daily.     . mometasone (NASONEX) 50 MCG/ACT nasal spray Place 2 sprays into the nose daily as needed.      . Multiple Vitamin (MULTIVITAMIN WITH  MINERALS) TABS tablet Take 1 tablet by mouth daily.    . simvastatin (ZOCOR) 10 MG tablet Take 10 mg by mouth daily.    . TRUETEST TEST test strip     .  Vortioxetine HBr (BRINTELLIX) 10 MG TABS Take 10 mg by mouth every morning.      No current facility-administered medications for this visit.    Allergies as of 12/08/2013 - Review Complete 12/08/2013  Allergen Reaction Noted  . Other Itching 11/19/2010    Vitals: BP 127/58 mmHg  Pulse 65  Temp(Src) 98.4 F (36.9 C) (Oral)  Resp 15  Ht 5\' 9"  (1.753 m)  Wt 224 lb (101.606 kg)  BMI 33.06 kg/m2  LMP 10/22/2007 Last Weight:  Wt Readings from Last 1 Encounters:  12/08/13 224 lb (101.606 kg)   Last Height:   Ht Readings from Last 1 Encounters:  12/08/13 5\' 9"  (1.753 m)    Physical exam:  General: The patient is awake, alert and appears not in acute distress. The patient is well groomed. Head: Normocephalic, atraumatic. Neck is supple. Mallampati 2, neck circumference: [id number] , no TMJ click , no delayed swallowing , no goiter.  Cardiovascular:  Regular rate and rhythm, without  murmurs or carotid bruit, and without distended neck veins. Respiratory: Lungs are clear to auscultation. Skin:  Without evidence of edema, or rash Trunk: BMI is still  elevated (33 BMI ) and patient  has normal posture.  Neurologic exam : The patient is awake and alert, oriented to place and time.   Memory subjective described as intact.   Cranial nerves: Pupils are equal and briskly reactive to light.   Extraocular movements  in vertical and horizontal planes intact and without nystagmus. Visual fields by finger perimetry are intact. Hearing to finger rub intact.  Facial sensation intact to fine touch. Facial motor strength is symmetric and tongue and uvula move midline.  Motor exam:  Normal tone and normal muscle bulk and symmetric normal strength in all extremities.  Grip weakness.  Crepitation over both knees with ROM. Arthralgic gait. Foot pain,  bunion in both .   Sensory:  Fine touch, pinprick and vibration were reduced in all 10 toes. Finger sensation is normal. No carpal tunnel. Peripheral neuropathy.   Coordination: Rapid alternating movements in the fingers/hands is tested and normal.  Finger-to-nose maneuver - no  evidence of ataxia, dysmetria or tremor.  Gait and station: Patient walks without assistive device.  Assessment:  After physical and neurologic examination, review of laboratory studies, imaging, neurophysiology testing and pre-existing records, assessment is   1) OSA on CPAP with a very low residual AHI and excellent compliance.  Due to the recent weight loss , an auto set will be used to allow reduction in CPAP pressure as the weight loss continues. She will need a new mask fitting. She has an arthralgic gait , due to years of morbid obesity and has diabetic neuropathy in her feet.    Plan:  Treatment plan and additional workup : DME is [facility] ,   nasal  pillow was working well after weight loss.  Setting to be adjusted auto : 6  to 12 cm water.  Needs 12 cm water , 3 cm EPR> DME notified. RV with me or NP in 6 month.

## 2013-12-21 HISTORY — PX: BUNIONECTOMY: SHX129

## 2014-03-17 ENCOUNTER — Encounter: Payer: Self-pay | Admitting: Neurology

## 2014-03-21 ENCOUNTER — Encounter: Payer: Self-pay | Admitting: Neurology

## 2014-06-08 ENCOUNTER — Encounter: Payer: Self-pay | Admitting: Neurology

## 2014-06-08 ENCOUNTER — Ambulatory Visit (INDEPENDENT_AMBULATORY_CARE_PROVIDER_SITE_OTHER): Payer: BLUE CROSS/BLUE SHIELD | Admitting: Neurology

## 2014-06-08 VITALS — BP 130/62 | HR 78 | Resp 20 | Ht 68.5 in | Wt 218.0 lb

## 2014-06-08 DIAGNOSIS — Z9989 Dependence on other enabling machines and devices: Secondary | ICD-10-CM

## 2014-06-08 DIAGNOSIS — G4733 Obstructive sleep apnea (adult) (pediatric): Secondary | ICD-10-CM | POA: Diagnosis not present

## 2014-06-08 DIAGNOSIS — E0842 Diabetes mellitus due to underlying condition with diabetic polyneuropathy: Secondary | ICD-10-CM

## 2014-06-08 DIAGNOSIS — E662 Morbid (severe) obesity with alveolar hypoventilation: Secondary | ICD-10-CM | POA: Diagnosis not present

## 2014-06-08 NOTE — Patient Instructions (Signed)

## 2014-06-08 NOTE — Progress Notes (Signed)
[facility]   Provider:  Larey Seat, [place] D  Referring Provider: Thressa Sheller, MD Primary Care Physician:  Thressa Sheller, MD  Chief Complaint  Patient presents with  . Follow-up    cpap, rm 11, alone    HPI:  Darlene [name] is a 64 y.o. female  Is seen here as a 2 year revisit from Dr. Tamala Fothergill  for compliance visit , the patient has sleep apnea and is on CPAP.  Evaluated and  diagnosed with severe sleep apnea at an AHI of 56 at the [facility] in 2008. Her sleep study was split to be titrated to CPAP.  She had used a full face mask , had a retitration study on 7-12- 2013- this time at [facility] sleep upon Dr Chales Abrahams referral .  the study confirmed obstructive sleep apnea but with a lower AHI of 16.3 and in addition with multiple periodic limb movements that are aroused the patient about 4.4 times per hour of sleep. her sleep study also documented an oxygen nadir of 83% with 82.2 minutes of desaturations at all below 90%.  She was sent home with an autotitrator,  since she just missed the SPLIT  qualifying  criteria.  She uses a nasal pillow.  She had reported that she continued to kick her husband and her sleep. The autotitration revealed best results at 14 cm water with 3 cm EPR - but she still endorsed an Epworth sleepiness score of 16 points in August 2013- After using an auto titrating machine. Pre study Epworth sleepiness score was 21/24 points. In October 2014 she underwent a Roux and Y weight loss surgery upon recommendation of her internist and endocrinologist . She lost 65 pounds since and has gained stamina and day time alertness back.   She feels motivated and participates in a special exercise program at [facility].  The patient works for a Hotel manager site , Garment/textile technologist . She rises at 6 AM, wakes spontaneously, feels refreshed, not a caffeine drinker. No sodas. Driving to work for 30 minutes in [place], [place] at 8  AM and 5 PM . No naps, 10 PM is bedtime , falls asleep promptly . compliance data showed that the patient is as per CPAP machine 7 hours and 47 minutes at night on average the residual AHI is 1.3 the machine is set at 12 cm water pressure now was 2 cm EPR. She does still have a high air leak. She is using a nasal pillow now. She has a high air leak daily and  probably needs to be refitted. This may be due to weight loss. sleepiness score was endorsed at 6 point and fatigue severity at 15 points, the geriatric depression scale was endorsed at 2 points.   12-08-13: Darlene [name] is on an autoset machine with a pressure window between 6 and 12 cm water and 3 cm EPR the 95% for pressure is 11.6 cm. Currently the machine is basically used  at 12 cm water. The patient has an AHI of 1.0, her air leak is moderate. Epworth 4,  GDS zero, FSS 12 .  06-08-14  This is a yearly interval history for Darlene [name], actually saw 6 months ago. Darlene [name] CPAP memory chip was coapted and didn't bring any data for today's visit but was in the last 90 days we have data from prior. She has an 80% compliance for over 4 hours of daily use an average user time of 5 hours  57 minutes her machine is an AutoSet with a minimum pressure of 6 maximum pressure of 12 EPR level of 3 cm water the AutoSet indicated that the 91st percentile pressure is 10.9 and in my opinion she does not have to set her machine to any specific pressure since the 95th percentile pressure is still within her window. She is using the machine compliantly her Epworth sleepiness score was endorsed at 3 points and her fatigue severity score at only 9 points which are excellent results. She does have occasional air leaks but these are in moderate range. She sleeps between 7 nd 8 hours.  She uses a nasal pillow mask. Sometimes the mask gets dislodged but not often and sometimes she has nasal rhinitis or congestion and will need to take a break from CPAP use for a  couple of days until she recovers. She reports that she has often gotten entangled and she likes to sleep on her side area to for this reason I showed her the dream ware mask by [employer] and I think it would be a good choice for her to try. She should be able to exchange to this kind of mask after 90 days when her time off replacing the other one would come up.    Review of Systems: Out of a complete 14 system review, the patient complains of only the following symptoms, and all other reviewed systems are negative. Epworth and FSS significantly reduced.  Losing weight , status post gastric bypass, 217 from 292 pounds.  Dr. Johnathan Hausen MD in [place]>  She is at 64 today, 06-08-14 , she was unable to exercise after bunion surgery.    History   Social History  . Marital Status: Married    Spouse Name: [name]  . Number of Children: 0  . Years of Education: Bachelor's   Occupational History  . Theatre stage manager    Social History Main Topics  . Smoking status: Never Smoker   . Smokeless tobacco: Never Used  . Alcohol Use: No  . Drug Use: No  . Sexual Activity: Yes    Birth Control/ Protection: Post-menopausal   Other Topics Concern  . Not on file   Social History Narrative   Patient is married [name]) and lives at home with her husband.   Patient does not have any children.   Patient is working for a [employer].   Patient has a Bachelor's degree.   Patient is right-handed.   Patient does not drink any caffeine.    Family History  Problem Relation Age of Onset  . CVA Mother   . Diabetes Father   . Heart failure Father   . Hypertension Sister   . Diabetes Brother   . Diabetes Paternal Uncle     Past Medical History  Diagnosis Date  . Obesity   . Hypertension   . Hyperlipidemia   . Obstructive sleep apnea on CPAP   . Diabetes mellitus without complication     [id number] years  . Eating disorder     binge eating  . Balance problem   . Arthritis     in  fingers, shoulders  . Anemia     hx of  . Depression   . Ovarian cyst rupture     possible  . Clumsiness     Past Surgical History  Procedure Laterality Date  . Carpal tunnel release Right   . Carpal tunnel release Left   . Breath tek h pylori N/A 09/15/2012  Procedure: BREATH TEK H PYLORI;  Surgeon: Pedro Earls, MD;  Location: [facility];  Service: General;  Laterality: N/A;  . Dupuytren contracture release    . Trigger finger release      x3  . Toe surgery  1970s    bone spur  . Rotator cuff repair Right 2005  . Gastric roux-en-y N/A 11/02/2012    Procedure: LAPAROSCOPIC ROUX-EN-Y GASTRIC;  Surgeon: Pedro Earls, MD;  Location: [facility];  Service: General;  Laterality: N/A;    Current Outpatient Prescriptions  Medication Sig Dispense Refill  . acetaminophen (TYLENOL) 500 MG tablet Take 500 mg by mouth every 6 (six) hours as needed for pain.    [name] aspirin 81 MG tablet Take 81 mg by mouth daily.      . Calcium Citrate-Vitamin D (CALCIUM CITRATE+D3 PO) Take 1,500 mg by mouth daily.    [name] Sodium (COLACE PO) Take 1 capsule by mouth 3 (three) times daily.    [name] ibuprofen (ADVIL,MOTRIN) 200 MG tablet Take 200 mg by mouth every 6 (six) hours as needed for pain.    [name] losartan (COZAAR) 100 MG tablet Take 1 tablet by mouth daily.     . mometasone (NASONEX) 50 MCG/ACT nasal spray Place 2 sprays into the nose daily as needed.      . Multiple Vitamin (MULTIVITAMIN WITH MINERALS) TABS tablet Take 1 tablet by mouth daily. With iron    . NOVOLIN 70/30 (70-30) 100 UNIT/ML injection 10 units two times a day    . TRUETEST TEST test strip     . Vortioxetine HBr (BRINTELLIX) 10 MG TABS Take 10 mg by mouth every morning.      No current facility-administered medications for this visit.    Allergies as of 06/08/2014 - Review Complete 06/08/2014  Allergen Reaction Noted  . Other Itching 11/19/2010    Vitals: BP 130/62 mmHg  Pulse 78  Resp 20  Ht 5' 8.5" (1.74 m)  Wt 218  lb (98.884 kg)  BMI 32.66 kg/m2  LMP 10/22/2007 Last Weight:  Wt Readings from Last 1 Encounters:  06/08/14 218 lb (98.884 kg)   Last Height:   Ht Readings from Last 1 Encounters:  06/08/14 5' 8.5" (1.74 m)    Physical exam:  General: The patient is awake, alert and appears not in acute distress. The patient is well groomed. Head: Normocephalic, atraumatic. Neck is supple. Mallampati 2, neck circumference: [id number] , no TMJ click , no delayed swallowing , no goiter.  Rhinitis with nasal congestion.  Cardiovascular:  Regular rate and rhythm, without  murmurs or carotid bruit, and without distended neck veins. Respiratory: Lungs are clear to auscultation. No wheezing.  Skin:  Without evidence of edema, or rash Trunk: BMI is still  elevated (33 BMI ) and patient  has normal posture.  Neurologic exam : The patient is awake and alert, oriented to place and time.  Memory subjective described as intact.   Cranial nerves: Pupils are equal and briskly reactive to light. Extraocular movements  in vertical and horizontal planes intact and without nystagmus. Visual fields by finger perimetry are intact. Hearing to finger rub intact.  Facial sensation intact to fine touch. Facial motor strength is symmetric and tongue and uvula move midline. Motor exam:  Normal tone and normal muscle bulk and symmetric normal strength in all extremities.  Grip weakness.  Crepitation over both knees with ROM. Arthralgic gait. Foot pain, bunion in both .  Sensory:  Fine touch, pinprick and  vibration were reduced in all 10 toes. Finger sensation is normal. No carpal tunnel. Peripheral neuropathy.  Coordination: Rapid alternating movements in the fingers/hands is tested and normal.  Finger-to-nose maneuver - no  evidence of ataxia, dysmetria or tremor. Gait and station: Patient walks without assistive device.  Assessment:  After physical and neurologic examination, review of laboratory studies, imaging,  neurophysiology testing and pre-existing records, assessment is   1) OSA on CPAP with a very low residual AHI and excellent compliance. Change mask to dream wear , order written, patient requests [facility].  2) weight loss surgery in a previously morbidly obese atient. Due to the recent weight loss , an auto set will be used to allow reduction in CPAP pressure as the weight loss continues. She will need a new mask fitting. 3)She has an arthralgic gait , due to years of morbid obesity and has diabetic neuropathy in her feet. Bunion surgery Dec 21st 2015.  4) rhinitis , I will prescribe nasocort     Plan:  Treatment plan and additional workup : DME is [facility] ,   nasal  pillow was working well after weight loss.  Setting to be adjusted auto : 6  to 12 cm water.  Needs 12 cm water , 3 cm EPR> DME notified. RV with me or NP in 6 month.

## 2014-06-22 DIAGNOSIS — E041 Nontoxic single thyroid nodule: Secondary | ICD-10-CM

## 2014-06-22 HISTORY — DX: Nontoxic single thyroid nodule: E04.1

## 2014-06-22 HISTORY — PX: BIOPSY THYROID: PRO38

## 2014-07-05 ENCOUNTER — Ambulatory Visit (INDEPENDENT_AMBULATORY_CARE_PROVIDER_SITE_OTHER): Payer: BLUE CROSS/BLUE SHIELD | Admitting: Nurse Practitioner

## 2014-07-05 ENCOUNTER — Encounter: Payer: Self-pay | Admitting: Nurse Practitioner

## 2014-07-05 VITALS — BP 122/66 | HR 60 | Ht 68.75 in | Wt 214.0 lb

## 2014-07-05 DIAGNOSIS — Z0000 Encounter for general adult medical examination without abnormal findings: Secondary | ICD-10-CM | POA: Diagnosis not present

## 2014-07-05 DIAGNOSIS — Z01419 Encounter for gynecological examination (general) (routine) without abnormal findings: Secondary | ICD-10-CM

## 2014-07-05 NOTE — Progress Notes (Addendum)
64 y.o. [id number] Married  Caucasian Fe here for annual exam.  New diagnosis of trigger finger on the right.  Slight vaginal dryness.  Last HGB AIC 7.1 about 3 months ago.  Has lost 12 more pounds this year since weight loss surgery 2014.  Patient's last menstrual period was 10/22/2007.          Sexually active: Yes.    The current method of family planning is post menopausal status.    Exercising: Yes.    Home exercise routine includes stationary bike 30 minutes 3 times weekly. Smoker:  no  Health Maintenance: Pap:  06/04/11, negative with neg HR HPV MMG:  07/05/13 with diagnostic right on 07/13/13, Bi-Rads 2:  Benign; [facility] Colonoscopy:  11/24/13, Diverticulosis, repeat in 10 years; Dr. Collene Mares BMD:   07/05/13, T Score 2.7 S/0.4 R/0.6 L  TDaP:  UTD with PCP Shingles: 2015 Labs:   HB:  PCP  Urine:  PCP   reports that she has never smoked. She has never used smokeless tobacco. She reports that she does not drink alcohol or use illicit drugs.  Past Medical History  Diagnosis Date  . Obesity   . Hypertension   . Hyperlipidemia   . Obstructive sleep apnea on CPAP   . Diabetes mellitus without complication     123456 years  . Eating disorder     binge eating  . Balance problem   . Arthritis     in fingers, shoulders  . Anemia     hx of  . Depression   . Ovarian cyst rupture     possible  . Clumsiness     Past Surgical History  Procedure Laterality Date  . Carpal tunnel release Right   . Carpal tunnel release Left   . Breath tek h pylori N/A 09/15/2012    Procedure: BREATH TEK H PYLORI;  Surgeon: Pedro Earls, MD;  Location: [facility];  Service: General;  Laterality: N/A;  . Dupuytren contracture release    . Trigger finger release      x3  . Toe surgery  1970s    bone spur  . Rotator cuff repair Right 2005  . Gastric roux-en-y N/A 11/02/2012    Procedure: LAPAROSCOPIC ROUX-EN-Y GASTRIC;  Surgeon: Pedro Earls, MD;  Location: [facility];  Service: General;  Laterality: N/A;   . Bunionectomy Left 12/2013    Current Outpatient Prescriptions  Medication Sig Dispense Refill  . acetaminophen (TYLENOL) 500 MG tablet Take 500 mg by mouth every 6 (six) hours as needed for pain.    [name] aspirin 81 MG tablet Take 81 mg by mouth daily.      . Calcium Citrate-Vitamin D (CALCIUM CITRATE+D3 PO) Take 1,500 mg by mouth daily.    Darlene [name] Sodium (COLACE PO) Take 1 capsule by mouth 2 (two) times daily.     [name] glucose blood (ONETOUCH VERIO) test strip 1 each by Other route as needed for other. Use as instructed    . ibuprofen (ADVIL,MOTRIN) 200 MG tablet Take 200 mg by mouth every 6 (six) hours as needed for pain.    [name] losartan (COZAAR) 100 MG tablet Take 1 tablet by mouth daily.     . mometasone (NASONEX) 50 MCG/ACT nasal spray Place 2 sprays into the nose daily as needed.      . Multiple Vitamin (MULTIVITAMIN WITH MINERALS) TABS tablet Take 1 tablet by mouth daily. With iron    . NOVOLIN 70/30 (70-30) 100 UNIT/ML injection 10 units two  times a day    . Vortioxetine HBr (BRINTELLIX) 10 MG TABS Take 10 mg by mouth every morning.      No current facility-administered medications for this visit.    Family History  Problem Relation Age of Onset  . CVA Mother   . Diabetes Father   . Heart failure Father   . Hypertension Sister   . Diabetes Brother   . Diabetes Paternal Uncle     ROS:  Pertinent items are noted in HPI.  Otherwise, a comprehensive ROS was negative.  Exam:   BP 122/66 mmHg  Pulse 60  Ht 5' 8.75" (1.746 m)  Wt 214 lb (97.07 kg)  BMI 31.84 kg/m2  LMP 10/22/2007 Height: 5' 8.75" (174.6 cm) Ht Readings from Last 3 Encounters:  07/05/14 5' 8.75" (1.746 m)  06/08/14 5' 8.5" (1.74 m)  12/08/13 5\' 9"  (1.753 m)    General appearance: alert, cooperative and appears stated age Head: Normocephalic, without obvious abnormality, atraumatic Neck: no adenopathy, supple, symmetrical, trachea midline and thyroid normal to inspection and palpation Lungs: clear to  auscultation bilaterally Breasts: normal appearance, no masses or tenderness, with irregular area right upper quadrant.  Possible FCB changes will get diagnostic Mammo. Heart: regular rate and rhythm Abdomen: soft, non-tender; no masses,  no organomegaly Extremities: extremities normal, atraumatic, no cyanosis or edema Skin: Skin color, texture, turgor normal. No rashes or lesions Lymph nodes: Cervical, supraclavicular, and axillary nodes normal. No abnormal inguinal nodes palpated Neurologic: Grossly normal   Pelvic: External genitalia:  no lesions              Urethra:  normal appearing urethra with no masses, tenderness or lesions              Bartholin's and Skene's: normal                 Vagina: normal appearing vagina with normal color and discharge, no lesions              Cervix: anteverted              Pap taken: Yes.   Bimanual Exam:  Uterus:  normal size, contour, position, consistency, mobility, non-tender              Adnexa: no mass, fullness, tenderness               Rectovaginal: Confirms               Anus:  normal sphincter tone, no lesions  Chaperone present: yes  A:  Well Woman with normal exam  Postmenopausal - no HRT Gastric Roux-en-Y bypass 10/2012 Endo biopsy with proliferative endo 01/2008 - took provera for awhile off for about 2 years.  HTN, IDDM  changes on right breast exam  P:   Reviewed health and wellness pertinent to exam  Pap smear as above  Mammogram is due now and scheduled to be done Diagnostic  Counseled on breast self exam, mammography screening, adequate intake of calcium and vitamin D, diet and exercise, Kegel's exercises return annually or prn  An After Visit Summary was printed and given to the patient.

## 2014-07-05 NOTE — Patient Instructions (Addendum)

## 2014-07-05 NOTE — Progress Notes (Signed)
Patient is scheduled for Bilateral Breast Diagnostic Mammogram and R Breast Ultrasound at [place] on 07/18/14 at 2:00 . Patient agreeable to time/date/location.

## 2014-07-07 LAB — IPS PAP TEST WITH HPV

## 2014-07-07 NOTE — Progress Notes (Signed)
Reviewed personally.  M. Suzanne Miller, MD.  

## 2014-07-19 ENCOUNTER — Other Ambulatory Visit: Payer: Self-pay | Admitting: Radiology

## 2014-07-22 ENCOUNTER — Encounter: Payer: Self-pay | Admitting: *Deleted

## 2014-07-22 ENCOUNTER — Telehealth: Payer: Self-pay | Admitting: *Deleted

## 2014-07-22 ENCOUNTER — Telehealth: Payer: Self-pay | Admitting: Nurse Practitioner

## 2014-07-22 DIAGNOSIS — C50411 Malignant neoplasm of upper-outer quadrant of right female breast: Secondary | ICD-10-CM

## 2014-07-22 DIAGNOSIS — Z171 Estrogen receptor negative status [ER-]: Secondary | ICD-10-CM

## 2014-07-22 HISTORY — DX: Malignant neoplasm of upper-outer quadrant of right female breast: C50.411

## 2014-07-22 NOTE — Telephone Encounter (Signed)
Patient was called and discussed her recent diagnosis of breast cancer.  At the very least this has been a shock to her and husband.  She is going to see surgeon and oncologist on Wednesday 07/27/14 and proceed with plan.  She is advised if we can help with anything to call back.  She is very [employer] attorney.

## 2014-07-22 NOTE — Telephone Encounter (Signed)
Keep in mammogram hold file until sees surgeon and oncology.   Cc - Kem Boroughs

## 2014-07-22 NOTE — Telephone Encounter (Signed)
Continued Mammogram hold

## 2014-07-22 NOTE — Telephone Encounter (Signed)
Confirmed [facility] for 07/27/14 at 0800 .  Instructions and contact information given.

## 2014-07-25 NOTE — Progress Notes (Signed)
 [facility]  [phone] [phone]     ID: Darlene [name] DOB: 06/14/1950  MR#: [id number]  CSN#:[id number]  Patient Care Team: Brian Mackenzie, MD as PCP - General (Internal Medicine) Sara S Himmelrich, RD as Dietitian (Bariatrics) Faera Byerly, MD as Consulting Physician (General Surgery) Gustav C Magrinat, MD as Consulting Physician (Oncology) Robert Murray, MD as Consulting Physician (Radiation Oncology) Dawn C Stuart, RN as Registered Nurse Keisha N Martini, RN as Registered Nurse PCP: MACKENZIE,BRIAN, MD GYN: Patricia Grubb FNP OTHER MD:  CHIEF COMPLAINT:  HER-2 positive , estrogen receptor negative breast cancer  CURRENT TREATMENT:  Neoadjuvant chemotherapy with anti-HER-2 treatment   BREAST CANCER HISTORY: "Darlene [name]" had screening mammography at her gynecologist suggestive of a possible abnormality in the right breast. However right diagnostic mammogram 04/12/2013 at [facility] was negative. More recently however, she noted a lump in her right breast and brought it to her gynecologist's attention. On 07/18/2014 the patient had bilateral diagnostic mammography with tomosynthesis at [facility]. The breast density was category B. In the right breast upper outer quadrant there was an area of focal asymmetry with indistinct margins.. Ultrasound was obtained and showed in addition to the mass in question, measuring 1.7 cm, an abnormal-appearing lymph node in the right axillary tail.  On 07/19/2014 the patient underwent biopsy of the right breast massin question as well as a right axillary lymph node. Both were positive for invasive ductal carcinoma, grade 2, estrogen and progesterone receptor negative, with an MIB-1 of 90%, and HER-2 pending. Incidentally the lymph node biopsy showed a lymphocytic inflammatory component but no lymph node architecture.  Her subsequent history is as detailed below.  INTERVAL HISTORY: Darlene [name] was evaluated in the  multidisciplinary breast cancer clinic 07/27/2014 accompanied by her husband [name]. Her case was also presented at the multidisciplinary breast cancer conference that same morning. At that time a preliminary plan was proposed: neoadjuvant chemotherapy/anti-HER-2 therapy followed by surgery with consideration of the Alliance Trial, followed by radiation.  REVIEW OF SYSTEMS: Aside from the mass itself, there were no specific symptoms leading to the diagnostic mammogram, The patient denies unusual headaches, visual changes, nausea, vomiting, stiff neck, dizziness, or gait imbalance. There has been no cough, phlegm production, or pleurisy, no chest pain or pressure, and no change in bowel or bladder habits. The patient denies fever, rash, bleeding, unexplained fatigue or unexplained weight loss. Her diabetes is much better controlled since she went through her Roux-en-Y surgery and lost 125 pounds. She does admit to some insomnia, hearing loss, seasonal sinus allergies, and arthritis. Her history of depression is managed through Dr. McKinney. A detailed review of systems was otherwise entirely negative.  PAST MEDICAL HISTORY: Past Medical History  Diagnosis Date  . Obesity   . Hypertension   . Hyperlipidemia   . Obstructive sleep apnea on CPAP   . Diabetes mellitus without complication     20+ years  . Eating disorder     binge eating  . Balance problem   . Arthritis     in fingers, shoulders  . Anemia     hx of  . Depression   . Ovarian cyst rupture     possible  . Clumsiness   . Breast cancer of upper-outer quadrant of right female breast 07/22/2014  . Breast cancer   . Anxiety     PAST SURGICAL HISTORY: Past Surgical History  Procedure Laterality Date  . Carpal tunnel release Right   . Carpal tunnel release Left   .   Breath tek h pylori N/A 09/15/2012    Procedure: BREATH TEK H PYLORI;  Surgeon: Pedro Earls, MD;  Location: [facility];  Service: General;  Laterality: N/A;  .  Dupuytren contracture release    . Trigger finger release      x3  . Toe surgery  1970s    bone spur  . Rotator cuff repair Right 2005  . Gastric roux-en-y N/A 11/02/2012    Procedure: LAPAROSCOPIC ROUX-EN-Y GASTRIC;  Surgeon: Pedro Earls, MD;  Location: [facility];  Service: General;  Laterality: N/A;  . Bunionectomy Left 12/2013    FAMILY HISTORY Family History  Problem Relation Age of Onset  . CVA Mother   . Diabetes Father   . Heart failure Father   . Hypertension Sister   . Diabetes Brother   . Diabetes Paternal Uncle    the patient's father died from complications of diabetes at age 65. The patient's mother died at age 2 with a subarachnoid hemorrhage. The patient had one brother, one sister. The only breast cancer was the patient's paternal grandmother diagnosed in her 29s. There is no history of ovarian cancer in the family.  GYNECOLOGIC HISTORY:  Patient's last menstrual period was 10/22/2007.  menarche age 70, the patient is GX P0. She went through the change of life in 2011. She did not take hormone replacement.  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 Her husband [name] is a retired Visual merchandiser (he taught at [employer]).  [name] has 2 children from an earlier marriage, Rockey Situ who teaches in [place], and Kalisa Girtman,  Who will works at the [employer] in in [place]. He has 1 grand son, aged 24. The patient and her husband are not church attender's    ADVANCED DIRECTIVES:  Not in place   HEALTH MAINTENANCE: History  Substance Use Topics  . Smoking status: Never Smoker   . Smokeless tobacco: Never Used  . Alcohol Use: 0.0 oz/week    0 Standard drinks or equivalent per week     Colonoscopy:  May 2016  PAP:  Bone density: 07/05/2013 normal, with a T score of 0.4  Lipid panel:  Allergies  Allergen Reactions  . Other Itching    Peanuts in large quantities      Current Outpatient Prescriptions  Medication Sig Dispense Refill  . aspirin 81 MG tablet Take 81 mg by mouth daily.      . Calcium Citrate-Vitamin D (CALCIUM CITRATE+D3 PO) Take 1,500 mg by mouth daily.    [name] Sodium (COLACE PO) Take 1 capsule by mouth 2 (two) times daily.     [name] glucose blood (ONETOUCH VERIO) test strip 1 each by Other route as needed for other. Use as instructed    . losartan (COZAAR) 100 MG tablet Take 1 tablet by mouth daily.     . mometasone (NASONEX) 50 MCG/ACT nasal spray Place 2 sprays into the nose daily as needed.      . Multiple Vitamin (MULTIVITAMIN WITH MINERALS) TABS tablet Take 1 tablet by mouth daily. With iron    . NOVOLIN 70/30 (70-30) 100 UNIT/ML injection 10 units two times a day    . Vortioxetine HBr (BRINTELLIX) 10 MG TABS Take 10 mg by mouth every morning.     [name] acetaminophen (TYLENOL) 500 MG tablet Take 500 mg by mouth every 6 (six) hours as needed for pain.    [name] ibuprofen (ADVIL,MOTRIN)  200 MG tablet Take 200 mg by mouth every 6 (six) hours as needed for pain.     No current facility-administered medications for this visit.    OBJECTIVE:  Middle-aged white woman who appears older than stated age 72 Vitals:   07/27/14 0834  BP: 145/54  Pulse: 54  Temp: 97.8 F (36.6 C)  Resp: 18     Body mass index is 33.09 kg/(m^2).    ECOG FS:1 - Symptomatic but completely ambulatory  Ocular: Sclerae unicteric, pupils equal, round and reactive to light Ear-nose-throat: Oropharynx clear and moist Lymphatic: No cervical or supraclavicular adenopathy Lungs no rales or rhonchi, good excursion bilaterally Heart regular rate and rhythm, no murmur appreciated Abd soft, nontender, positive bowel sounds MSK no focal spinal tenderness, no joint edema Neuro: non-focal, well-oriented, appropriate affect Breasts:  There is a mass in the lower outer quadrant of the right breast which is easily palpable. It measures approximately 2 cm. It is movable, and  not associated with skin erythema. The rest of the breast is unremarkable. The right axilla is benign. The left breast is unremarkable.   LAB RESULTS:  CMP     Component Value Date/Time   NA 141 07/27/2014 0819   NA 140 02/18/2013 0827   K 4.6 07/27/2014 0819   K 4.3 02/18/2013 0827   CL 104 02/18/2013 0827   CO2 27 07/27/2014 0819   CO2 25 02/18/2013 0827   GLUCOSE 232* 07/27/2014 0819   GLUCOSE 168* 02/18/2013 0827   BUN 35.7* 07/27/2014 0819   BUN 26* 02/18/2013 0827   CREATININE 1.3* 07/27/2014 0819   CREATININE 1.10 02/18/2013 0827   CREATININE 1.37* 11/02/2012 1230   CALCIUM 9.2 07/27/2014 0819   CALCIUM 9.5 02/18/2013 0827   PROT 6.2* 07/27/2014 0819   PROT 6.3 02/18/2013 0827   ALBUMIN 3.6 07/27/2014 0819   ALBUMIN 3.9 02/18/2013 0827   AST 29 07/27/2014 0819   AST 23 02/18/2013 0827   ALT 53 07/27/2014 0819   ALT 36* 02/18/2013 0827   ALKPHOS 71 07/27/2014 0819   ALKPHOS 68 02/18/2013 0827   BILITOT 0.71 07/27/2014 0819   BILITOT 0.9 02/18/2013 0827   GFRNONAA 55* 02/18/2013 0827   GFRNONAA 41* 11/02/2012 1230   GFRAA 63 02/18/2013 0827   GFRAA 48* 11/02/2012 1230    INo results found for: SPEP, UPEP  Lab Results  Component Value Date   WBC 6.6 07/27/2014   NEUTROABS 3.9 07/27/2014   HGB 11.8 07/27/2014   HCT 35.0 07/27/2014   MCV 91.7 07/27/2014   PLT 220 07/27/2014      Chemistry      Component Value Date/Time   NA 141 07/27/2014 0819   NA 140 02/18/2013 0827   K 4.6 07/27/2014 0819   K 4.3 02/18/2013 0827   CL 104 02/18/2013 0827   CO2 27 07/27/2014 0819   CO2 25 02/18/2013 0827   BUN 35.7* 07/27/2014 0819   BUN 26* 02/18/2013 0827   CREATININE 1.3* 07/27/2014 0819   CREATININE 1.10 02/18/2013 0827   CREATININE 1.37* 11/02/2012 1230      Component Value Date/Time   CALCIUM 9.2 07/27/2014 0819   CALCIUM 9.5 02/18/2013 0827   ALKPHOS 71 07/27/2014 0819   ALKPHOS 68 02/18/2013 0827   AST 29 07/27/2014 0819   AST 23 02/18/2013 0827    ALT 53 07/27/2014 0819   ALT 36* 02/18/2013 0827   BILITOT 0.71 07/27/2014 0819   BILITOT 0.9 02/18/2013 0827  No results found for: LABCA2  No components found for: LABCA125  No results for input(s): INR in the last 168 hours.  Urinalysis    Component Value Date/Time   BILIRUBINUR neg 06/29/2013 1017   PROTEINUR trace 06/29/2013 1017   UROBILINOGEN negative 06/29/2013 1017   NITRITE neg 06/29/2013 1017   LEUKOCYTESUR Negative 06/29/2013 1017    STUDIES:  outside studies reviewed  ASSESSMENT: 63 y.o. [place] woman status post right breast and right axillary lymph node biopsy 07/19/2014 for a cT1c  pN1, stage IIA  invasive ductal carcinoma, grade 2, estrogen and progesterone receptor negative, with an MIB-1 of 90%, and HER-2 pending.  (1) neoadjuvant chemotherapy will consist of carboplatin, docetaxel, trastuzumab and pertuzumab given every 3 weeks 6, with Neulasta support  (2) Definitive radiation to follow surgery, with consideration of the Alliance trial  (3) adjuvant radiation to follow surgery   PLAN: We spent the better part of today's hour-long appointment discussing the biology of breast cancer in general, and the specifics of the patient's tumor in particular. Darlene [name] understands there is no survival difference between receiving chemotherapy first and then surgery, as opposed to surgery first and then chemotherapy. In her case we are suggesting neoadjuvant treatment because it optimizes her anti-HER-2 treatment and it gives her the opportunity of participating in the Alliance trial, which may spare her a complete axillary dissection. She was able to understand these benefits and agrees.  We then discussed the specifics of her chemotherapy which will consist of carboplatin, docetaxel, trastuzumab, and pertuzumab. She understands the possible toxicities, side effects and complications of these agents. In her case and particularly concerned regarding the diabetes  both because of sugar control and because of concerns regarding neuropathy.  For that reason we are going to do Decadron only the day before chemotherapy. On the day of chemotherapy of course she will also receive steroids. We are omitting the steroids though from days 23 and 4. Instead she will use ondansetron and prochlorperazine for nausea control those days. Clearly 4 days 0, 1 and 2 she will need to check her blood sugar 4 times a day and follow a sliding scale to make sure we don't and up with very high values.  I have encouraged her to increase her exercise daily from 20 minutes a day to 40. She of course will need a port, and echocardiogram, and a baseline MRI. She will also come to chemotherapy school. She will see me before starting chemotherapy to discuss supportive therapy and make sure she has all her prescriptions inhale.  Unfortunately we were not able to coordinate her chemotherapy treatments so that she would be able to keep her trip to [place] the first week in September. I have written her a note requesting that they either give her a refund or allow her to reschedule without penalty.  Darlene [name] has a good understanding of the overall plan. She agrees with it. She knows the goal of treatment in her case is cure. She will call with any problems that may develop before her next visit here.  MAGRINAT,GUSTAV C, MD   07/27/2014 12:47 PM Medical Oncology and Hematology [facility] [address] [place], [place] [address] Tel. [phone]    Fax. [phone]    

## 2014-07-27 ENCOUNTER — Ambulatory Visit: Payer: BLUE CROSS/BLUE SHIELD | Attending: General Surgery | Admitting: Physical Therapy

## 2014-07-27 ENCOUNTER — Encounter: Payer: Self-pay | Admitting: Oncology

## 2014-07-27 ENCOUNTER — Other Ambulatory Visit (HOSPITAL_BASED_OUTPATIENT_CLINIC_OR_DEPARTMENT_OTHER): Payer: BLUE CROSS/BLUE SHIELD

## 2014-07-27 ENCOUNTER — Other Ambulatory Visit: Payer: Self-pay | Admitting: General Surgery

## 2014-07-27 ENCOUNTER — Encounter: Payer: Self-pay | Admitting: Physical Therapy

## 2014-07-27 ENCOUNTER — Ambulatory Visit
Admission: RE | Admit: 2014-07-27 | Discharge: 2014-07-27 | Disposition: A | Discharge status: Home / Self Care | Payer: BLUE CROSS/BLUE SHIELD | Source: Ambulatory Visit | Attending: Radiation Oncology | Admitting: Radiation Oncology

## 2014-07-27 ENCOUNTER — Other Ambulatory Visit: Payer: Self-pay | Admitting: *Deleted

## 2014-07-27 ENCOUNTER — Encounter: Payer: Self-pay | Admitting: Skilled Nursing Facility1

## 2014-07-27 ENCOUNTER — Ambulatory Visit: Payer: BLUE CROSS/BLUE SHIELD

## 2014-07-27 ENCOUNTER — Encounter: Payer: Self-pay | Admitting: *Deleted

## 2014-07-27 ENCOUNTER — Telehealth: Payer: Self-pay | Admitting: Oncology

## 2014-07-27 ENCOUNTER — Ambulatory Visit (HOSPITAL_BASED_OUTPATIENT_CLINIC_OR_DEPARTMENT_OTHER): Payer: BLUE CROSS/BLUE SHIELD | Admitting: Oncology

## 2014-07-27 VITALS — BP 145/54 | HR 54 | Temp 97.8°F | Resp 18 | Ht 68.75 in | Wt 222.4 lb

## 2014-07-27 DIAGNOSIS — C50411 Malignant neoplasm of upper-outer quadrant of right female breast: Secondary | ICD-10-CM

## 2014-07-27 DIAGNOSIS — Z171 Estrogen receptor negative status [ER-]: Secondary | ICD-10-CM | POA: Diagnosis not present

## 2014-07-27 DIAGNOSIS — C773 Secondary and unspecified malignant neoplasm of axilla and upper limb lymph nodes: Secondary | ICD-10-CM | POA: Diagnosis not present

## 2014-07-27 DIAGNOSIS — R293 Abnormal posture: Secondary | ICD-10-CM | POA: Insufficient documentation

## 2014-07-27 LAB — COMPREHENSIVE METABOLIC PANEL (CC13)
ALT: 53 U/L (ref 0–55)
AST: 29 U/L (ref 5–34)
Albumin: 3.6 g/dL (ref 3.5–5.0)
Alkaline Phosphatase: 71 U/L (ref 40–150)
Anion Gap: 7 mEq/L (ref 3–11)
BUN: 35.7 mg/dL — ABNORMAL HIGH (ref 7.0–26.0)
CO2: 27 mEq/L (ref 22–29)
Calcium: 9.2 mg/dL (ref 8.4–10.4)
Chloride: 108 mEq/L (ref 98–109)
Creatinine: 1.3 mg/dL — ABNORMAL HIGH (ref 0.6–1.1)
EGFR: 45 mL/min/{1.73_m2} — ABNORMAL LOW (ref >90)
Glucose: 232 mg/dl — ABNORMAL HIGH (ref 70–140)
Potassium: 4.6 mEq/L (ref 3.5–5.1)
Sodium: 141 mEq/L (ref 136–145)
Total Bilirubin: 0.71 mg/dL (ref 0.20–1.20)
Total Protein: 6.2 g/dL — ABNORMAL LOW (ref 6.4–8.3)

## 2014-07-27 LAB — CBC WITH DIFFERENTIAL/PLATELET
BASO%: 0.6 % (ref 0.0–2.0)
Basophils Absolute: 0 10*3/uL (ref 0.0–0.1)
EOS%: 3.2 % (ref 0.0–7.0)
Eosinophils Absolute: 0.2 10*3/uL (ref 0.0–0.5)
HCT: 35 % (ref 34.8–46.6)
HGB: 11.8 g/dL (ref 11.6–15.9)
LYMPH%: 30.6 % (ref 14.0–49.7)
MCH: 31 pg (ref 25.1–34.0)
MCHC: 33.8 g/dL (ref 31.5–36.0)
MCV: 91.7 fL (ref 79.5–101.0)
MONO#: 0.5 10*3/uL (ref 0.1–0.9)
MONO%: 6.9 % (ref 0.0–14.0)
NEUT#: 3.9 10*3/uL (ref 1.5–6.5)
NEUT%: 58.7 % (ref 38.4–76.8)
Platelets: 220 10*3/uL (ref 145–400)
RBC: 3.82 10*6/uL (ref 3.70–5.45)
RDW: 13.8 % (ref 11.2–14.5)
WBC: 6.6 10*3/uL (ref 3.9–10.3)
lymph#: 2 10*3/uL (ref 0.9–3.3)

## 2014-07-27 NOTE — Patient Instructions (Signed)

## 2014-07-27 NOTE — Progress Notes (Addendum)
[facility] NEW PATIENT EVALUATION  Name: Darlene [name] MRN: [id number]  Date:   07/27/2014           DOB: 21-Feb-1950  Status: outpatient   CC: Thressa Sheller, MD  Stark Klein, MD , Dr. Gunnar Bulla Magrinat   REFERRING PHYSICIAN: Stark Klein, MD   DIAGNOSIS: Clinical stage IIA (T1 N1 M0) invasive ductal carcinoma/DCIS of the right breast   HISTORY OF PRESENT ILLNESS:  Darlene [name] is a 64 y.o. female who is seen today through the courtesy of Dr. Barry Dienes at the breast multidisciplinary clinic for evaluation of her T1 N1 invasive ductal carcinoma/DCIS of the right breast.  She states that she noted a breast mass along the upper outer quadrant of the right breast approximately 2 months ago.  She went to [facility] where she had diagnostic mammography on 07/18/2014.  There was an irregular mass measuring 1.7 cm along the upper-outer breast on ultrasound.  There was also a suspicious right axillary lymph node.  Mammography showed focal asymmetry with an indistinct margin in the right breast at 11:00.  She underwent ultrasound guided biopsies of the right breast and axilla on 07/19/2014.  Biopsy of the breast was diagnostic for invasive ductal carcinoma/DCIS.  The right axillary lymph node was also positive for metastatic carcinoma but without definitive features of a lymph node with differential considerations including total replacement of a lymph node by invasive tumor or soft tissue tumor deposit.  Her carcinoma is ER/PR negative and HER-2/neu positive.  Ki-67 is 90%.  She is  seen today with Dr. Barry Dienes and Dr. Jana Hakim.  PREVIOUS RADIATION THERAPY: No   PAST MEDICAL HISTORY:  has a past medical history of Obesity; Hypertension; Hyperlipidemia; Obstructive sleep apnea on CPAP; Diabetes mellitus without complication; Eating disorder; Balance problem; Arthritis; Anemia; Depression; Ovarian cyst rupture; Clumsiness; Breast cancer of upper-outer quadrant of right  female breast (07/22/2014); Breast cancer; and Anxiety.     PAST SURGICAL HISTORY:  Past Surgical History  Procedure Laterality Date  . Carpal tunnel release Right   . Carpal tunnel release Left   . Breath tek h pylori N/A 09/15/2012    Procedure: BREATH TEK H PYLORI;  Surgeon: Pedro Earls, MD;  Location: [facility];  Service: General;  Laterality: N/A;  . Dupuytren contracture release    . Trigger finger release      x3  . Toe surgery  1970s    bone spur  . Rotator cuff repair Right 2005  . Gastric roux-en-y N/A 11/02/2012    Procedure: LAPAROSCOPIC ROUX-EN-Y GASTRIC;  Surgeon: Pedro Earls, MD;  Location: [facility];  Service: General;  Laterality: N/A;  . Bunionectomy Left 12/2013     FAMILY HISTORY: family history includes CVA in her mother; Diabetes in her brother, father, and paternal uncle; Heart failure in her father; Hypertension in her sister.  her paternal grandmother was diagnosed with breast cancer in her 65s.  Her father died from complications of diabetes mellitus and congestive heart failure at 38.  Her mother died following a stroke at 42.   SOCIAL HISTORY:  reports that she has never smoked. She has never used smokeless tobacco. She reports that she drinks alcohol. She reports that she does not use illicit drugs.  Married, no children.  She worked as a Teacher, adult education.   ALLERGIES: Other   MEDICATIONS:  Current Outpatient Prescriptions  Medication Sig Dispense Refill  . acetaminophen (TYLENOL) 500 MG tablet Take 500 mg by mouth  every 6 (six) hours as needed for pain.    [name] aspirin 81 MG tablet Take 81 mg by mouth daily.      . Calcium Citrate-Vitamin D (CALCIUM CITRATE+D3 PO) Take 1,500 mg by mouth daily.    [name] Sodium (COLACE PO) Take 1 capsule by mouth 2 (two) times daily.     [name] glucose blood (ONETOUCH VERIO) test strip 1 each by Other route as needed for other. Use as instructed    . ibuprofen (ADVIL,MOTRIN) 200 MG tablet Take 200 mg by mouth every  6 (six) hours as needed for pain.    [name] losartan (COZAAR) 100 MG tablet Take 1 tablet by mouth daily.     . mometasone (NASONEX) 50 MCG/ACT nasal spray Place 2 sprays into the nose daily as needed.      . Multiple Vitamin (MULTIVITAMIN WITH MINERALS) TABS tablet Take 1 tablet by mouth daily. With iron    . NOVOLIN 70/30 (70-30) 100 UNIT/ML injection 10 units two times a day    . Vortioxetine HBr (BRINTELLIX) 10 MG TABS Take 10 mg by mouth every morning.      No current facility-administered medications for this encounter.     REVIEW OF SYSTEMS:  Pertinent items are noted in HPI.    PHYSICAL EXAM: Alert and oriented 64 year old white female appearing her stated age. Wt Readings from Last 3 Encounters:  07/27/14 222 lb 6.4 oz (100.88 kg)  07/05/14 214 lb (97.07 kg)  06/08/14 218 lb (98.884 kg)   Temp Readings from Last 3 Encounters:  07/27/14 97.8 F (36.6 C) Oral  12/08/13 98.4 F (36.9 C) Oral  05/06/13 98.2 F (36.8 C) Temporal   BP Readings from Last 3 Encounters:  07/27/14 145/54  07/05/14 122/66  06/08/14 130/62   Pulse Readings from Last 3 Encounters:  07/27/14 54  07/05/14 60  06/08/14 78   Head and neck examination: Grossly unremarkable.  Nodes: Without palpable cervical, supraclavicular, or axillary lymphadenopathy.  Chest: Lungs clear.  Breasts: She is large breasted and her breast are pendulous.  There is ecchymosis along her upper outer quadrant in addition to a palpable 3 cm mass at approximately 10:00.  This mass may partially represent hematoma based on her tumor size seen on ultrasound.  Left breast without masses or lesions.  Extremities: Without edema.    LABORATORY DATA:  Lab Results  Component Value Date   WBC 6.6 07/27/2014   HGB 11.8 07/27/2014   HCT 35.0 07/27/2014   MCV 91.7 07/27/2014   PLT 220 07/27/2014   Lab Results  Component Value Date   NA 141 07/27/2014   K 4.6 07/27/2014   CL 104 02/18/2013   CO2 27 07/27/2014   Lab Results   Component Value Date   ALT 53 07/27/2014   AST 29 07/27/2014   ALKPHOS 71 07/27/2014   BILITOT 0.71 07/27/2014      IMPRESSION: Clinical stage IIA (T1 N1 M0) invasive ductal/DCIS of the right breast.  In general terms, we discussed local regional management which include mastectomy with or without reconstruction or partial mastectomy followed by radiation therapy.  She would be a candidate for the Alliance Trial.  We need to make sure that her axillary pathology is satisfactory to call this a lymph node metastasis (and not a soft tissue metastasis) since there is no lymph node material within the biopsy.  From a technical standpoint, treatment to her right breast would be challenging because of her breast size and skin  folds.  We discussed the potential acute and late toxicities of radiation therapy.  We also discussed the Alliance trial and the randomizations depending on whether not she has residual axillary disease.  If she goes on to have a mastectomy along with an axillary lymph node dissection with no evidence for residual disease within the breast or axilla, one could consider not giving her post mastectomy radiation therapy.  However, if she chooses to keep her breast then she will require radiation therapy.  We discussed the potential acute and late toxicities of radiation therapy.  Ideally, this would be best delivered prone, but we will not be able to reliably treat her lymph nodes in the prone position.  I understand that she will have a MRI scan for follow-up purposes receiving new adjuvant chemotherapy.   PLAN: As discussed above.  I spent 30  minutes face to face with the patient and more than 50% of that time was spent in counseling and/or coordination of care.

## 2014-07-27 NOTE — Progress Notes (Signed)
Clinical Social Work [facility] Psychosocial Distress Screening [facility]  Patient completed distress screening protocol and scored an 8 on the Psychosocial Distress Thermometer which indicates moderate distress. Clinical Social Worker met with patient and patients husband in [facility] to assess for distress and other psychosocial needs. Patient stated that although she was feeling overwhelmed she felt "better" after meeting with the treatment team and getting information on her treatment plan. CSW and patient discussed common feeling and emotions when being diagnosed with cancer, and the importance of support during treatment. CSW informed patient of the support team and support services at [facility], and patient was agreeable to an alight guide referral. CSW provided contact information and encouraged patient to call with any questions or concerns.  ON[facility]N DISTRESS SCREENING 07/27/2014  Screening Type Initial Screening  Distress experienced in past week (1-10) 8  Practical problem type Insurance;Work/school  Family Problem type Partner  Emotional problem type Depression;Nervousness/Anxiety;Adjusting to illness  Spiritual/Religous concerns type Facing my mortality  Information Concerns Type Lack of info about diagnosis;Lack of info about treatment;Lack of info about complementary therapy choices;Lack of info about maintaining fitness  Physical Problem type Sleep/insomnia;Constipation/diarrhea;Tingling hands/feet;Skin dry/itchy  Physician notified of physical symptoms Yes  Referral to clinical psychology No  Referral to clinical social work Yes  Referral to dietition No  Referral to financial advocate No  Referral to support programs Yes  Referral to palliative care No   Abigail Elmore, MSW, LCSW, OSW-C Clinical Social Worker [facility] [phone]       

## 2014-07-27 NOTE — Therapy (Signed)
[place], [place], [address] Phone: [phone]   Fax:  [phone]  Physical Therapy Evaluation  Patient Details  Name: Darlene [name] MRN: [id number] Date of Birth: 06/17/1950 Referring Provider:  Stark Klein, MD  Encounter Date: 07/27/2014      PT End of Session - 07/27/14 1556    Visit Number 1   Number of Visits 1   PT Start Time 1055   PT Stop Time 1124   PT Time Calculation (min) 29 min   Activity Tolerance Patient tolerated treatment well   Behavior During Therapy [facility] for tasks assessed/performed      Past Medical History  Diagnosis Date  . Obesity   . Hypertension   . Hyperlipidemia   . Obstructive sleep apnea on CPAP   . Diabetes mellitus without complication     87+ years  . Eating disorder     binge eating  . Balance problem   . Arthritis     in fingers, shoulders  . Anemia     hx of  . Depression   . Ovarian cyst rupture     possible  . Clumsiness   . Breast cancer of upper-outer quadrant of right female breast 07/22/2014  . Breast cancer   . Anxiety     Past Surgical History  Procedure Laterality Date  . Carpal tunnel release Right   . Carpal tunnel release Left   . Breath tek h pylori N/A 09/15/2012    Procedure: BREATH TEK H PYLORI;  Surgeon: Pedro Earls, MD;  Location: [facility];  Service: General;  Laterality: N/A;  . Dupuytren contracture release    . Trigger finger release      x3  . Toe surgery  1970s    bone spur  . Rotator cuff repair Right 2005  . Gastric roux-en-y N/A 11/02/2012    Procedure: LAPAROSCOPIC ROUX-EN-Y GASTRIC;  Surgeon: Pedro Earls, MD;  Location: [facility];  Service: General;  Laterality: N/A;  . Bunionectomy Left 12/2013    There were no vitals filed for this visit.  Visit Diagnosis:  Carcinoma of upper-outer quadrant of right female breast - Plan: PT plan of care cert/re-cert  Abnormal posture - Plan: PT plan of  care cert/re-cert      Subjective Assessment - 07/27/14 1557    Pertinent History She was diagnosed 07/19/14 with right grade 2-3 invasive ductal carcinoma breast cancer.  It measured 1.7 cm in the upper outer quadrant with a Ki67 of 90%.  She has a positive axillary lymph node, is ER/PR negative and HER2 positive.            [facility] PT Assessment - 07/27/14 0001    Assessment   Medical Diagnosis right breast cancer   Onset Date/Surgical Date 07/19/14   Hand Dominance Right   Prior Therapy none   Precautions   Precautions Other (comment)  active cancer   Restrictions   Weight Bearing Restrictions No   Balance Screen   Has the patient fallen in the past 6 months No   Has the patient had a decrease in activity level because of a fear of falling?  No   Is the patient reluctant to leave their home because of a fear of falling?  No   Home Environment   Living Environment Private residence   Living Arrangements Spouse/significant other   Available Help at Discharge Family   Prior Function   Level of Independence Independent  Vocation Full time employment   Conservator, museum/gallery   Leisure She rides her bike 30 minutes each day   Cognition   Overall Cognitive Status Within Functional Limits for tasks assessed   Posture/Postural Control   Posture/Postural Control Postural limitations   Postural Limitations Rounded Shoulders;Forward head   ROM / Strength   AROM / PROM / Strength AROM;Strength   AROM   AROM Assessment Site Shoulder   Right/Left Shoulder Right;Left   Right Shoulder Extension 48 Degrees   Right Shoulder Flexion 143 Degrees   Right Shoulder ABduction 145 Degrees   Right Shoulder Internal Rotation 74 Degrees   Right Shoulder External Rotation 70 Degrees   Left Shoulder Extension 49 Degrees   Left Shoulder Flexion 146 Degrees   Left Shoulder ABduction 152 Degrees   Left Shoulder Internal Rotation 74 Degrees   Left Shoulder External Rotation 79  Degrees   Strength   Overall Strength Within functional limits for tasks performed           LYMPHEDEMA/ONCOLOGY QUESTIONNAIRE - 07/27/14 1551    Type   Cancer Type Right breast cancer   Lymphedema Assessments   Lymphedema Assessments Upper extremities   Right Upper Extremity Lymphedema   10 cm Proximal to Olecranon Process 29.7 cm   Olecranon Process 24.2 cm   10 cm Proximal to Ulnar Styloid Process 21.4 cm   Just Proximal to Ulnar Styloid Process 14.9 cm   Across Hand at [employer] 18.9 cm   At [place] of 2nd Digit 5.8 cm   Left Upper Extremity Lymphedema   10 cm Proximal to Olecranon Process 28.9 cm   Olecranon Process 24.1 cm   10 cm Proximal to Ulnar Styloid Process 21.6 cm   Just Proximal to Ulnar Styloid Process 15 cm   Across Hand at [employer] 18.8 cm   At [place] of 2nd Digit 6 cm       Patient was instructed today in a home exercise program today for post op shoulder range of motion. These included active assist shoulder flexion in sitting, scapular retraction, wall walking with shoulder abduction, and hands behind head external rotation.  She was encouraged to do these twice a day, holding 3 seconds and repeating 5 times when permitted by her physician.           PT Education - 07/27/14 1555    Education provided Yes   Education Details Lymphedema risk reduction and post op shoulder ROM HEP   Person(s) Educated Patient;Spouse   Methods Explanation;Demonstration;Handout   Comprehension Verbalized understanding;Returned demonstration              Breast Clinic Goals - 07/27/14 1604    Patient will be able to verbalize understanding of pertinent lymphedema risk reduction practices relevant to her diagnosis specifically related to skin care.   Time 1   Period Days   Status Achieved   Patient will be able to return demonstrate and/or verbalize understanding of the post-op home exercise program related to regaining shoulder range of motion.   Time  1   Period Days   Status Achieved   Patient will be able to verbalize understanding of the importance of attending the postoperative After Breast Cancer Class for further lymphedema risk reduction education and therapeutic exercise.   Time 1   Period Days   Status Achieved              Plan - 07/27/14 1557    Clinical Impression Statement  She was diagnosed 07/19/14 with right grade 2-3 invasive ductal carcinoma breast cancer.  It measured 1.7 cm in the upper outer quadrant with a Ki67 of 90%.  She has a positive axillary lymph node, is ER/PR negative and HER2 positive.  She is planning to have neoadjuvant chemotherapy followed by a right lumpectomy with either a sentinel node biopsy or axillary lymph node dissection and then radiation.  She will benefit from post op PT to regain shoulder ROM and strength and reduce lymphedema risk.   Pt will benefit from skilled therapeutic intervention in order to improve on the following deficits Decreased range of motion;Pain;Impaired UE functional use;Decreased knowledge of precautions;Decreased strength   Rehab Potential Excellent   Clinical Impairments Affecting Rehab Potential none   PT Frequency One time visit   PT Treatment/Interventions Patient/family education;Therapeutic exercise   Consulted and Agree with Plan of Care Patient;Family member/caregiver   Family Member Consulted Husband       Patient will follow up at outpatient cancer rehab if needed following surgery.  If the patient requires physical therapy at that time, a specific plan will be dictated and sent to the referring physician for approval. The patient was educated today on appropriate basic range of motion exercises to begin post operatively and the importance of attending the After Breast Cancer class following surgery.  Patient was educated today on lymphedema risk reduction practices as it pertains to recommendations that will benefit the patient immediately following surgery.   She verbalized good understanding.  No additional physical therapy is indicated at this time.      Problem List Patient Active Problem List   Diagnosis Date Noted  . Breast cancer of upper-outer quadrant of right female breast 07/22/2014  . OSA on CPAP 06/02/2013  . H/O bariatric surgery 06/02/2013  . Type II or unspecified type diabetes mellitus with neurological manifestations, not stated as uncontrolled 06/02/2013  . 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 Depression 09/10/2012  . hyperlipidemia 09/10/2012  . Diabetes 09/03/2012  . Morbid obesity 09/03/2012    Annia Friendly, PT 07/27/2014, 4:07 PM  [place], [place], [address] Phone: [phone]   Fax:  [phone]

## 2014-07-27 NOTE — Progress Notes (Signed)
Subjective:     Patient ID: Darlene [name], female   DOB: 07-08-1950, 64 y.o.   MRN: [id number]  HPI   Review of Systems     Objective:   Physical Exam For the patient to understand and be given the tools to implement a healthy plant based diet during their cancer diagnosis.     Assessment:     Patient was seen today and found to be in good spirits and accompanied by her seemingly supportive husband. Pt states she is currently seeing a dietitian four her Roux en Y surgery done 1.5 years ago. Pts current/relevant medications: multivitamin, vitamin B12, alpha-lipoic acid, and calcium citrate. Pts current/relevant labs: glucose 232, BUN 35.7, Cr 1.3, and GFR 45. Pt is 5'8'' 222 pounds, and BMI 33.2. Pt enquired about the safety of soy.     Plan:     Dietitian educated the patient on implementing a plant based diet by incorporating more plant proteins, fruits, and vegetables. As a part of a healthy routine physical activity was discussed. Dietitian educated the pt on current research on soy. The importance of legitimate, evidence based information was discussed and examples were given. A folder of evidence based information with a focus on a plant based diet and general nutrition during cancer was given to the patient.  As a part of the continuum of care the cancer dietitian's contact information was given to the patient in the event they would like to have a follow up appointment.

## 2014-07-27 NOTE — Telephone Encounter (Signed)
Appointments made and avs printed for patient °

## 2014-07-27 NOTE — Progress Notes (Signed)
Checked in new pt with no financial concerns prior to seeing the dr.  Informed pt if chemo is part of her treatment we will contact her ins to see if auth is required and will obtain it if it is as well as contact foundations that offer copay assistance for chemo if needed.  She has my card for any questions or concerns. °

## 2014-07-28 ENCOUNTER — Other Ambulatory Visit: Payer: BLUE CROSS/BLUE SHIELD

## 2014-07-29 ENCOUNTER — Encounter (HOSPITAL_BASED_OUTPATIENT_CLINIC_OR_DEPARTMENT_OTHER): Payer: Self-pay | Admitting: *Deleted

## 2014-07-29 ENCOUNTER — Telehealth: Payer: Self-pay | Admitting: *Deleted

## 2014-07-29 ENCOUNTER — Ambulatory Visit (HOSPITAL_COMMUNITY)
Admission: RE | Admit: 2014-07-29 | Discharge: 2014-07-29 | Disposition: A | Discharge status: Home / Self Care | Payer: BLUE CROSS/BLUE SHIELD | Source: Ambulatory Visit | Attending: Oncology | Admitting: Oncology

## 2014-07-29 DIAGNOSIS — I517 Cardiomegaly: Secondary | ICD-10-CM | POA: Diagnosis not present

## 2014-07-29 DIAGNOSIS — Z01810 Encounter for preprocedural cardiovascular examination: Secondary | ICD-10-CM | POA: Insufficient documentation

## 2014-07-29 DIAGNOSIS — I351 Nonrheumatic aortic (valve) insufficiency: Secondary | ICD-10-CM | POA: Insufficient documentation

## 2014-07-29 DIAGNOSIS — C50411 Malignant neoplasm of upper-outer quadrant of right female breast: Secondary | ICD-10-CM | POA: Diagnosis not present

## 2014-07-29 NOTE — Progress Notes (Signed)
  Echocardiogram 2D Echocardiogram has been performed.  Diamond Nickel 07/29/2014, 2:59 PM

## 2014-07-29 NOTE — Telephone Encounter (Signed)
-----   Message from Mauro Kaufmann, RN sent at 07/29/2014  9:01 AM EDT ----- Regarding: Care Plan Hello All,  Ms Nonie Hoyer was seen in clinic on 7/6. She is scheduled for the following.  Chemo Class 7/7 Echo 7/8 Breast MRI 7/11 Port 7/12 F/u with Dr. Jana Hakim 7/14 1st chemo 7/18  Please let me know if you have questions  Thanks, [facility]

## 2014-07-29 NOTE — Telephone Encounter (Signed)
Spoke to pt concerning [facility] from 07/27/14. Denies questions or concerns regarding dx or treatment care plan. Confirmed future appts. Asked to r/s appt with Dr. Iran Planas. Scheduled for 7/20 at 2:30PM. Pt aware. Encourage pt to call with needs. Received verbal understanding.

## 2014-08-01 ENCOUNTER — Ambulatory Visit
Admission: RE | Admit: 2014-08-01 | Discharge: 2014-08-01 | Disposition: A | Discharge status: Home / Self Care | Payer: BLUE CROSS/BLUE SHIELD | Source: Ambulatory Visit | Attending: Oncology | Admitting: Oncology

## 2014-08-01 DIAGNOSIS — C50411 Malignant neoplasm of upper-outer quadrant of right female breast: Secondary | ICD-10-CM

## 2014-08-01 MED ORDER — GADOBENATE DIMEGLUMINE 529 MG/ML IV SOLN
10.0000 mL | Freq: Once | INTRAVENOUS | Status: AC | PRN
  Administered 2014-08-01: 10 mL via INTRAVENOUS

## 2014-08-02 ENCOUNTER — Ambulatory Visit (HOSPITAL_BASED_OUTPATIENT_CLINIC_OR_DEPARTMENT_OTHER): Payer: BLUE CROSS/BLUE SHIELD | Admitting: Anesthesiology

## 2014-08-02 ENCOUNTER — Ambulatory Visit (HOSPITAL_COMMUNITY): Payer: BLUE CROSS/BLUE SHIELD

## 2014-08-02 ENCOUNTER — Encounter (HOSPITAL_BASED_OUTPATIENT_CLINIC_OR_DEPARTMENT_OTHER)
Admission: RE | Disposition: A | Discharge status: Home / Self Care | Payer: Self-pay | Source: Ambulatory Visit | Attending: General Surgery

## 2014-08-02 ENCOUNTER — Other Ambulatory Visit (HOSPITAL_COMMUNITY): Payer: BLUE CROSS/BLUE SHIELD

## 2014-08-02 ENCOUNTER — Ambulatory Visit (HOSPITAL_BASED_OUTPATIENT_CLINIC_OR_DEPARTMENT_OTHER)
Admission: RE | Admit: 2014-08-02 | Discharge: 2014-08-02 | Disposition: A | Discharge status: Home / Self Care | Payer: BLUE CROSS/BLUE SHIELD | Source: Ambulatory Visit | Attending: General Surgery | Admitting: General Surgery

## 2014-08-02 ENCOUNTER — Encounter (HOSPITAL_BASED_OUTPATIENT_CLINIC_OR_DEPARTMENT_OTHER): Payer: Self-pay | Admitting: *Deleted

## 2014-08-02 DIAGNOSIS — Z171 Estrogen receptor negative status [ER-]: Secondary | ICD-10-CM | POA: Diagnosis not present

## 2014-08-02 DIAGNOSIS — Z9884 Bariatric surgery status: Secondary | ICD-10-CM | POA: Diagnosis not present

## 2014-08-02 DIAGNOSIS — Z95828 Presence of other vascular implants and grafts: Secondary | ICD-10-CM

## 2014-08-02 DIAGNOSIS — M1990 Unspecified osteoarthritis, unspecified site: Secondary | ICD-10-CM | POA: Insufficient documentation

## 2014-08-02 DIAGNOSIS — C50911 Malignant neoplasm of unspecified site of right female breast: Secondary | ICD-10-CM | POA: Diagnosis present

## 2014-08-02 DIAGNOSIS — I10 Essential (primary) hypertension: Secondary | ICD-10-CM | POA: Diagnosis not present

## 2014-08-02 DIAGNOSIS — Z803 Family history of malignant neoplasm of breast: Secondary | ICD-10-CM | POA: Insufficient documentation

## 2014-08-02 DIAGNOSIS — E119 Type 2 diabetes mellitus without complications: Secondary | ICD-10-CM | POA: Insufficient documentation

## 2014-08-02 DIAGNOSIS — G4730 Sleep apnea, unspecified: Secondary | ICD-10-CM | POA: Insufficient documentation

## 2014-08-02 DIAGNOSIS — C50411 Malignant neoplasm of upper-outer quadrant of right female breast: Secondary | ICD-10-CM | POA: Diagnosis not present

## 2014-08-02 DIAGNOSIS — Z79899 Other long term (current) drug therapy: Secondary | ICD-10-CM | POA: Diagnosis not present

## 2014-08-02 DIAGNOSIS — Z9989 Dependence on other enabling machines and devices: Secondary | ICD-10-CM | POA: Insufficient documentation

## 2014-08-02 HISTORY — PX: PORTACATH PLACEMENT: SHX2246

## 2014-08-02 HISTORY — DX: Myoneural disorder, unspecified: G70.9

## 2014-08-02 LAB — GLUCOSE, CAPILLARY
Glucose-Capillary: 168 mg/dL — ABNORMAL HIGH (ref 65–99)
Glucose-Capillary: 132 mg/dL — ABNORMAL HIGH (ref 65–99)

## 2014-08-02 SURGERY — INSERTION, TUNNELED CENTRAL VENOUS DEVICE, WITH PORT
Anesthesia: General | Site: Chest | Laterality: Left

## 2014-08-02 MED ORDER — BUPIVACAINE-EPINEPHRINE (PF) 0.5% -1:200000 IJ SOLN
INTRAMUSCULAR | Status: AC
  Filled 2014-08-02: qty 30

## 2014-08-02 MED ORDER — GLYCOPYRROLATE 0.2 MG/ML IJ SOLN: 0.2000 mg | Freq: Once | INTRAMUSCULAR | Status: DC | PRN

## 2014-08-02 MED ORDER — HEPARIN SOD (PORK) LOCK FLUSH 100 UNIT/ML IV SOLN
INTRAVENOUS | Status: DC | PRN
  Administered 2014-08-02: 500 [IU] via INTRAVENOUS

## 2014-08-02 MED ORDER — MEPERIDINE HCL 25 MG/ML IJ SOLN: 6.2500 mg | INTRAMUSCULAR | Status: DC | PRN

## 2014-08-02 MED ORDER — OXYCODONE-ACETAMINOPHEN 5-325 MG PO TABS: 1.0000 | ORAL_TABLET | ORAL | Status: DC | PRN

## 2014-08-02 MED ORDER — HEPARIN SOD (PORK) LOCK FLUSH 100 UNIT/ML IV SOLN
INTRAVENOUS | Status: AC
  Filled 2014-08-02: qty 5

## 2014-08-02 MED ORDER — BUPIVACAINE HCL (PF) 0.25 % IJ SOLN
INTRAMUSCULAR | Status: AC
  Filled 2014-08-02: qty 30

## 2014-08-02 MED ORDER — SCOPOLAMINE 1 MG/3DAYS TD PT72: 1.0000 | MEDICATED_PATCH | Freq: Once | TRANSDERMAL | Status: DC | PRN

## 2014-08-02 MED ORDER — BUPIVACAINE-EPINEPHRINE 0.5% -1:200000 IJ SOLN
INTRAMUSCULAR | Status: DC | PRN
  Administered 2014-08-02: 10 mL

## 2014-08-02 MED ORDER — LIDOCAINE HCL (CARDIAC) 20 MG/ML IV SOLN
INTRAVENOUS | Status: DC | PRN
  Administered 2014-08-02: 50 mg via INTRAVENOUS

## 2014-08-02 MED ORDER — PROPOFOL 10 MG/ML IV BOLUS
INTRAVENOUS | Status: DC | PRN
  Administered 2014-08-02: 200 mg via INTRAVENOUS

## 2014-08-02 MED ORDER — LACTATED RINGERS IV SOLN
INTRAVENOUS | Status: DC
  Administered 2014-08-02: 09:00:00 via INTRAVENOUS

## 2014-08-02 MED ORDER — FENTANYL CITRATE (PF) 100 MCG/2ML IJ SOLN
INTRAMUSCULAR | Status: AC
Start: 2014-08-02 — End: 2014-08-02
  Filled 2014-08-02: qty 4

## 2014-08-02 MED ORDER — OXYCODONE HCL 5 MG PO TABS
ORAL_TABLET | ORAL | Status: AC
  Filled 2014-08-02: qty 1

## 2014-08-02 MED ORDER — FENTANYL CITRATE (PF) 100 MCG/2ML IJ SOLN
50.0000 ug | INTRAMUSCULAR | Status: DC | PRN
  Administered 2014-08-02: 100 ug via INTRAVENOUS

## 2014-08-02 MED ORDER — HYDROMORPHONE HCL 1 MG/ML IJ SOLN: 0.2500 mg | INTRAMUSCULAR | Status: DC | PRN

## 2014-08-02 MED ORDER — DEXAMETHASONE SODIUM PHOSPHATE 4 MG/ML IJ SOLN
INTRAMUSCULAR | Status: DC | PRN
  Administered 2014-08-02: 8 mg via INTRAVENOUS

## 2014-08-02 MED ORDER — MIDAZOLAM HCL 2 MG/2ML IJ SOLN
1.0000 mg | INTRAMUSCULAR | Status: DC | PRN
  Administered 2014-08-02: 2 mg via INTRAVENOUS

## 2014-08-02 MED ORDER — MIDAZOLAM HCL 2 MG/2ML IJ SOLN
INTRAMUSCULAR | Status: AC
  Filled 2014-08-02: qty 2

## 2014-08-02 MED ORDER — ONDANSETRON HCL 4 MG/2ML IJ SOLN
INTRAMUSCULAR | Status: DC | PRN
  Administered 2014-08-02: 4 mg via INTRAVENOUS

## 2014-08-02 MED ORDER — HEPARIN (PORCINE) IN NACL 2-0.9 UNIT/ML-% IJ SOLN
INTRAMUSCULAR | Status: DC | PRN
  Administered 2014-08-02: 1 via INTRAVENOUS

## 2014-08-02 MED ORDER — HEPARIN (PORCINE) IN NACL 2-0.9 UNIT/ML-% IJ SOLN
INTRAMUSCULAR | Status: AC
Start: 2014-08-02 — End: 2014-08-02
  Filled 2014-08-02: qty 500

## 2014-08-02 MED ORDER — CEFAZOLIN SODIUM-DEXTROSE 2-3 GM-% IV SOLR
2.0000 g | INTRAVENOUS | Status: AC
  Administered 2014-08-02: 2 g via INTRAVENOUS

## 2014-08-02 MED ORDER — OXYCODONE HCL 5 MG/5ML PO SOLN: 5.0000 mg | Freq: Once | ORAL | Status: AC | PRN

## 2014-08-02 MED ORDER — OXYCODONE HCL 5 MG PO TABS
5.0000 mg | ORAL_TABLET | Freq: Once | ORAL | Status: AC | PRN
  Administered 2014-08-02: 5 mg via ORAL

## 2014-08-02 MED ORDER — LIDOCAINE-EPINEPHRINE (PF) 1 %-1:200000 IJ SOLN
INTRAMUSCULAR | Status: AC
Start: 2014-08-02 — End: 2014-08-02
  Filled 2014-08-02: qty 10

## 2014-08-02 SURGICAL SUPPLY — 40 items
BAG DECANTER FOR FLEXI CONT (MISCELLANEOUS) ×2 IMPLANT
BLADE HEX COATED 2.75 (ELECTRODE) ×2 IMPLANT
BLADE SURG 11 STRL SS (BLADE) ×2 IMPLANT
BLADE SURG 15 STRL LF DISP TIS (BLADE) ×1 IMPLANT
BLADE SURG 15 STRL SS (BLADE) ×1
CHLORAPREP W/TINT 26ML (MISCELLANEOUS) ×2 IMPLANT
COVER BACK TABLE 60X90IN (DRAPES) ×2 IMPLANT
COVER MAYO STAND STRL (DRAPES) ×2 IMPLANT
DRAPE C-ARM 42X72 X-RAY (DRAPES) ×2 IMPLANT
DRAPE LAPAROTOMY TRNSV 102X78 (DRAPE) ×2 IMPLANT
DRAPE UTILITY XL STRL (DRAPES) ×2 IMPLANT
DRSG TEGADERM 4X4.75 (GAUZE/BANDAGES/DRESSINGS) IMPLANT
ELECT REM PT RETURN 9FT ADLT (ELECTROSURGICAL) ×2
ELECTRODE REM PT RTRN 9FT ADLT (ELECTROSURGICAL) ×1 IMPLANT
GLOVE BIO SURGEON STRL SZ 6 (GLOVE) ×2 IMPLANT
GLOVE BIOGEL PI IND STRL 6.5 (GLOVE) ×1 IMPLANT
GLOVE BIOGEL PI IND STRL 7.5 (GLOVE) ×1 IMPLANT
GLOVE BIOGEL PI INDICATOR 6.5 (GLOVE) ×1
GLOVE BIOGEL PI INDICATOR 7.5 (GLOVE) ×1
GLOVE ECLIPSE 6.5 STRL STRAW (GLOVE) ×2 IMPLANT
GOWN STRL REUS W/ TWL LRG LVL3 (GOWN DISPOSABLE) ×1 IMPLANT
GOWN STRL REUS W/TWL 2XL LVL3 (GOWN DISPOSABLE) ×2 IMPLANT
GOWN STRL REUS W/TWL LRG LVL3 (GOWN DISPOSABLE) ×1
KIT PORT POWER 8FR ISP CVUE (Catheter) ×2 IMPLANT
LIQUID BAND (GAUZE/BANDAGES/DRESSINGS) ×2 IMPLANT
NEEDLE HYPO 25X1 1.5 SAFETY (NEEDLE) ×2 IMPLANT
PACK BASIN DAY SURGERY FS (CUSTOM PROCEDURE TRAY) ×2 IMPLANT
PENCIL BUTTON HOLSTER BLD 10FT (ELECTRODE) ×2 IMPLANT
SLEEVE SCD COMPRESS KNEE MED (MISCELLANEOUS) ×2 IMPLANT
SPONGE GAUZE 4X4 12PLY STER LF (GAUZE/BANDAGES/DRESSINGS) IMPLANT
SUT MNCRL AB 4-0 PS2 18 (SUTURE) ×2 IMPLANT
SUT PROLENE 2 0 SH DA (SUTURE) ×4 IMPLANT
SUT VIC AB 3-0 SH 27 (SUTURE) ×1
SUT VIC AB 3-0 SH 27X BRD (SUTURE) ×1 IMPLANT
SUT VICRYL 3-0 CR8 SH (SUTURE) IMPLANT
SYR 5ML LUER SLIP (SYRINGE) ×2 IMPLANT
SYR CONTROL 10ML LL (SYRINGE) ×2 IMPLANT
SYRINGE 10CC LL (SYRINGE) ×2 IMPLANT
TOWEL OR 17X24 6PK STRL BLUE (TOWEL DISPOSABLE) ×2 IMPLANT
TOWEL OR NON WOVEN STRL DISP B (DISPOSABLE) ×2 IMPLANT

## 2014-08-02 NOTE — Transfer of Care (Signed)
Immediate Anesthesia Transfer of Care Note  Patient: Darlene [name]  Procedure(s) Performed: Procedure(s): INSERTION PORT-A-CATH (Left)  Patient Location: PACU  Anesthesia Type:General  Level of Consciousness: awake, alert  and oriented  Airway & Oxygen Therapy: Patient Spontanous Breathing and Patient connected to face mask oxygen  Post-op Assessment: Report given to RN and Post -op Vital signs reviewed and stable  Post vital signs: Reviewed and stable  Last Vitals:  Filed Vitals:   08/02/14 0904  BP: 142/79  Pulse: 48  Temp: 36.7 C  Resp: 18    Complications: No apparent anesthesia complications

## 2014-08-02 NOTE — Op Note (Signed)
PREOPERATIVE DIAGNOSIS:  Right breast cancer     POSTOPERATIVE DIAGNOSIS:  Same     PROCEDURE: left subclavian port placement, Bard ClearVue  Power Port, MRI safe, 8-French.      SURGEON:  Stark Klein, MD      ANESTHESIA:  General   FINDINGS:  Good venous return, easy flush, and tip of the catheter and   SVC 24.5 cm.      SPECIMEN:  None.      ESTIMATED BLOOD LOSS:  Minimal.      COMPLICATIONS:  None known.      PROCEDURE:  Pt was identified in the holding area and taken to   the operating room, where patient was placed supine on the operating room   table.  General anesthesia was induced.  Patient's arms were tucked and the upper   chest and neck were prepped and draped in sterile fashion.  Time-out was   performed according to the surgical safety check list.  When all was   correct, we continued.   Local anesthetic was administered over this   area at the angle of the clavicle.  The vein was accessed with 1 pass of the needle. There was good venous return and the wire passed easily with no ectopy.   Fluoroscopy was used to confirm that the wire was in the vena cava.      The patient was placed back level and the area for the pocket was anethetized   with local anesthetic.  A 3-cm transverse incision was made with a #15   blade.  Cautery was used to divide the subcutaneous tissues down to the   pectoralis muscle.  An [employer] retractor was used to elevate the skin   while a pocket was created on top of the pectoralis fascia.  The port   was placed into the pocket to confirm that it was of adequate size.  The   catheter was preattached to the port.  The port was then secured to the   pectoralis fascia with four 2-0 Prolene sutures.  These were clamped and   not tied down yet.    The catheter was tunneled through to the wire exit   site.  The catheter was placed along the wire to determine what length it should be to be in the SVC.  The catheter was cut at 24.5 cm.  The  tunneler sheath and dilator were passed over the wire and the dilator and wire were removed.  The catheter was advanced through the tunneler sheath and the tunneler sheath was pulled away.  Care was taken to keep the catheter in the tunneler sheath as this occurred. This was advanced and the tunneler sheath was removed.  There was good venous   return and easy flush of the catheter.  The Prolene sutures were tied   down to the pectoral fascia.  The skin was reapproximated using 3-0   Vicryl interrupted deep dermal sutures.    Fluoroscopy was used to re-confirm good position of the catheter.  The skin   was then closed using 4-0 Monocryl in a subcuticular fashion.  The port was flushed with concentrated heparin flush as well.  The wounds were then cleaned, dried, and dressed with Dermabond.  The patient was awakened from anesthesia and taken to the PACU in stable condition.  Needle, sponge, and instrument counts were correct.               Stark Klein, MD

## 2014-08-02 NOTE — Anesthesia Procedure Notes (Signed)
Procedure Name: LMA Insertion Date/Time: 08/02/2014 10:38 AM Performed by: Melynda Ripple D Pre-anesthesia Checklist: Patient identified, Emergency Drugs available, Suction available and Patient being monitored Patient Re-evaluated:Patient Re-evaluated prior to inductionOxygen Delivery Method: Circle System Utilized Preoxygenation: Pre-oxygenation with 100% oxygen Intubation Type: IV induction Ventilation: Mask ventilation without difficulty LMA: LMA inserted LMA Size: 4.0 Number of attempts: 1 Airway Equipment and Method: Bite block Placement Confirmation: positive ETCO2 Tube secured with: Tape Dental Injury: Teeth and Oropharynx as per pre-operative assessment

## 2014-08-02 NOTE — Anesthesia Postprocedure Evaluation (Signed)
  Anesthesia Post-op Note  Patient: Darlene [name]  Procedure(s) Performed: Procedure(s): INSERTION PORT-A-CATH (Left)  Patient Location: PACU  Anesthesia Type: General   Level of Consciousness: awake, alert  and oriented  Airway and Oxygen Therapy: Patient Spontanous Breathing  Post-op Pain: mild  Post-op Assessment: Post-op Vital signs reviewed  Post-op Vital Signs: Reviewed  Last Vitals:  Filed Vitals:   08/02/14 1230  BP: 94/59  Pulse: 64  Temp: 36.6 C  Resp: 16    Complications: No apparent anesthesia complications

## 2014-08-02 NOTE — Discharge Instructions (Addendum)
[facility] °Office Phone Number [phone] ° ° POST OP INSTRUCTIONS ° °Always review your discharge instruction sheet given to you by the facility where your surgery was performed. ° °IF YOU HAVE DISABILITY OR FAMILY LEAVE FORMS, YOU MUST BRING THEM TO THE OFFICE FOR PROCESSING.  DO NOT GIVE THEM TO YOUR DOCTOR. ° °1. A prescription for pain medication may be given to you upon discharge.  Take your pain medication as prescribed, if needed.  If narcotic pain medicine is not needed, then you may take acetaminophen (Tylenol) or ibuprofen (Advil) as needed. °2. Take your usually prescribed medications unless otherwise directed °3. If you need a refill on your pain medication, please contact your pharmacy.  They will contact our office to request authorization.  Prescriptions will not be filled after 5pm or on week-ends. °4. You should eat very light the first 24 hours after surgery, such as soup, crackers, pudding, etc.  Resume your normal diet the day after surgery °5. It is common to experience some constipation if taking pain medication after surgery.  Increasing fluid intake and taking a stool softener will usually help or prevent this problem from occurring.  A mild laxative (Milk of Magnesia or Miralax) should be taken according to package directions if there are no bowel movements after 48 hours. °6. You may shower in 48 hours.  The surgical glue will flake off in 2-3 weeks.   °7. ACTIVITIES:  No strenuous activity or heavy lifting for 1 week.   °a. You may drive when you no longer are taking prescription pain medication, you can comfortably wear a seatbelt, and you can safely maneuver your car and apply brakes. °b. RETURN TO WORK:  __________to be determined._______________ °You should see your doctor in the office for a follow-up appointment approximately three-four weeks after your surgery.   ° °WHEN TO CALL YOUR DOCTOR: °1. Fever over 101.0 °2. Nausea and/or vomiting. °3. Extreme swelling  or bruising. °4. Continued bleeding from incision. °5. Increased pain, redness, or drainage from the incision. ° °The clinic staff is available to answer your questions during regular business hours.  Please don’t hesitate to call and ask to speak to one of the nurses for clinical concerns.  If you have a medical emergency, go to the nearest emergency room or call 911.  A surgeon from [facility] is always on call at the hospital. ° °For further questions, please visit [email or web address]  ° ° ° ° °Post Anesthesia Home Care Instructions ° °Activity: °Get plenty of rest for the remainder of the day. A responsible adult should stay with you for 24 hours following the procedure.  °For the next 24 hours, DO NOT: °-Drive a car °-Operate machinery °-Drink alcoholic beverages °-Take any medication unless instructed by your physician °-Make any legal decisions or sign important papers. ° °Meals: °Start with liquid foods such as gelatin or soup. Progress to regular foods as tolerated. Avoid greasy, spicy, heavy foods. If nausea and/or vomiting occur, drink only clear liquids until the nausea and/or vomiting subsides. Call your physician if vomiting continues. ° °Special Instructions/Symptoms: °Your throat may feel dry or sore from the anesthesia or the breathing tube placed in your throat during surgery. If this causes discomfort, gargle with warm salt water. The discomfort should disappear within 24 hours. ° °If you had a scopolamine patch placed behind your ear for the management of post- operative nausea and/or vomiting: ° °1. The medication in the patch is effective for 72 hours, after   which it should be removed.  Wrap patch in a tissue and discard in the trash. Wash hands thoroughly with soap and water. °2. You may remove the patch earlier than 72 hours if you experience unpleasant side effects which may include dry mouth, dizziness or visual disturbances. °3. Avoid touching the patch. Wash your  hands with soap and water after contact with the patch. °  ° ° °

## 2014-08-02 NOTE — Interval H&P Note (Signed)
History and Physical Interval Note:  08/02/2014 10:29 AM  Darlene [name]  has presented today for surgery, with the diagnosis of RIGHT BREAST CANCER  The various methods of treatment have been discussed with the patient and family. After consideration of risks, benefits and other options for treatment, the patient has consented to  Procedure(s): INSERTION PORT-A-CATH (Left) as a surgical intervention .  The patient's history has been reviewed, patient examined, no change in status, stable for surgery.  I have reviewed the patient's chart and labs.  Questions were answered to the patient's satisfaction.     BYERLY,FAERA

## 2014-08-02 NOTE — Anesthesia Preprocedure Evaluation (Signed)
Anesthesia Evaluation  Patient identified by MRN, date of birth, ID band Patient awake    Reviewed: Allergy & Precautions, NPO status , Patient's Chart, lab work & pertinent test results  Airway Mallampati: I  TM Distance: >3 FB Neck ROM: Full    Dental  (+) Teeth Intact, Dental Advisory Given   Pulmonary sleep apnea and Continuous Positive Airway Pressure Ventilation ,  breath sounds clear to auscultation        Cardiovascular hypertension, Pt. on medications Rhythm:Regular Rate:Normal     Neuro/Psych    GI/Hepatic   Endo/Other  diabetes, Well Controlled, Type 2, Insulin DependentMorbid obesity  Renal/GU      Musculoskeletal   Abdominal   Peds  Hematology   Anesthesia Other Findings   Reproductive/Obstetrics                             Anesthesia Physical Anesthesia Plan  ASA: III  Anesthesia Plan: General   Post-op Pain Management:    Induction: Intravenous  Airway Management Planned: LMA  Additional Equipment:   Intra-op Plan:   Post-operative Plan: Extubation in OR  Informed Consent: I have reviewed the patients History and Physical, chart, labs and discussed the procedure including the risks, benefits and alternatives for the proposed anesthesia with the patient or authorized representative who has indicated his/her understanding and acceptance.   Dental advisory given  Plan Discussed with: CRNA, Anesthesiologist and Surgeon  Anesthesia Plan Comments:         Anesthesia Quick Evaluation

## 2014-08-02 NOTE — H&P (Signed)
Darlene [name] 07/27/2014 7:47 AM Location: [facility] Patient #: [id number] DOB: 1950-10-16 Married / Language: English / Race: White Female  History of Present Illness Darlene Klein MD; 07/27/2014 12:40 PM) The patient is a 65 year old female who presents with breast cancer. Patient is a 64 yo F referred by Dr. Thressa Sheller for consultation regarding a new right breast cancer. The patient presented with a palpable mass on the right. She underwent diagnostic imaging and was found to have a 1.7 cm mass at 10 o'clock and a axillary lymph node with loss of fatty hilum. She subsequently underwent core needle biopsy of the breast mass and lymph node. Both were positive for grade 2-3 invasive ductal carcinoma, ER/PR negative, her 2 overexpressed. Ki 67 was 90%.  Patient had a paternal grandmother with breast cancer at age 43. Also, her nephew had total colectomy for cancer and "many many polyps." She is nulliparous. She had menarche at age 44. She had menopause at age 19. She did use hormonal contraception for around 6 years, but has not had HRT. She is up to date with her colonoscopy and pap smear.  mammogram/ultrasound - [facility] 1.7 cm irregular mass in the right breast upper outer aspect posterior depth is highly suggestive of malignancy. The oval axillary node in the right axillary tail is suspicious of malignancy.  pathology Diagnosis 1. Breast, right, needle core biopsy, 10 o'clock - INVASIVE DUCTAL CARCINOMA, SEE COMMENT. - DUCTAL CARCINOMA IN SITU. 2. Lymph node, needle/core biopsy - INVASIVE MAMMARY CARCINOMA WITH LYMPHOCYTIC INFLAMMATION, SEE COMMENT. labs are reviewed. cr 1.3. Glucose 232.    Other Problems Anderson [place], RMA; 07/27/2014 8:02 AM) Anxiety Disorder Arthritis Back Pain Breast Cancer Depression Diabetes Mellitus Diverticulosis Heart murmur High blood pressure Hypercholesterolemia Lump In Breast Other disease, cancer, significant  illness Sleep Apnea  Past Surgical History Jeanann Lewandowsky, RMA; 07/27/2014 8:02 AM) Breast Biopsy Right. Foot Surgery Bilateral, Right. Gastric Bypass Oral Surgery Resection of Stomach Shoulder Surgery Right.  Diagnostic Studies History Anderson Malta [place], [place]; 07/27/2014 8:02 AM) Colonoscopy within last year >10 years ago Mammogram within last year Pap Smear 1-5 years ago  Social History Anderson [place], RMA; 07/27/2014 8:02 AM) Alcohol use Occasional alcohol use. Caffeine use Coffee. No drug use Tobacco use Never smoker.  Family History Anderson Malta [place], [place]; 07/27/2014 8:02 AM) Arthritis Mother. Breast Cancer Family Members In General. Cerebrovascular Accident Mother. Depression Brother, Family Members In General. Diabetes Mellitus Brother, Family Members In [place], Father. Heart Disease Father. Heart disease in female family member before age 78 Hypertension Father, Sister. Kidney Disease Father.  Pregnancy / Birth History Anderson Malta [place], [place]; 07/27/2014 8:02 AM) Age at menarche 56 years. Age of menopause 39-55 56-62 Contraceptive History Oral contraceptives. Gravida 1 Irregular periods Maternal age 39-20 Para 0  Review of Systems Anderson [place] RMA; 07/27/2014 8:02 AM) General Not Present- Appetite Loss, Chills, Fatigue, Fever, Night Sweats, Weight Gain and Weight Loss. Skin Not Present- Change in Wart/Mole, Dryness, Hives, Jaundice, New Lesions, Non-Healing Wounds, Rash and Ulcer. HEENT Present- Hearing Loss, Seasonal Allergies and Wears glasses/contact lenses. Not Present- Earache, Hoarseness, Nose Bleed, Oral Ulcers, Ringing in the Ears, Sinus Pain, Sore Throat, Visual Disturbances and Yellow Eyes. Respiratory Not Present- Bloody sputum, Chronic Cough, Difficulty Breathing, Snoring and Wheezing. Breast Present- Breast Mass. Not Present- Breast Pain, Nipple Discharge and Skin Changes. Cardiovascular Not Present- Chest Pain, Difficulty Breathing  Lying Down, Leg Cramps, Palpitations, Rapid Heart Rate, Shortness of Breath and Swelling of Extremities. Gastrointestinal Present- Constipation.  Not Present- Abdominal Pain, Bloating, Bloody Stool, Change in Bowel Habits, Chronic diarrhea, Difficulty Swallowing, Excessive gas, Gets full quickly at meals, Hemorrhoids, Indigestion, Nausea, Rectal Pain and Vomiting. Female Genitourinary Not Present- Frequency, Nocturia, Painful Urination, Pelvic Pain and Urgency. Musculoskeletal Present- Joint Pain and Joint Stiffness. Not Present- Back Pain, Muscle Pain, Muscle Weakness and Swelling of Extremities. Neurological Present- Numbness. Not Present- Decreased Memory, Fainting, Headaches, Seizures, Tingling, Tremor, Trouble walking and Weakness. Psychiatric Present- Anxiety, Depression and Fearful. Not Present- Bipolar, Change in Sleep Pattern and Frequent crying. Endocrine Present- Cold Intolerance and Heat Intolerance. Not Present- Excessive Hunger, Hair Changes, Hot flashes and New Diabetes. Hematology Present- Easy Bruising. Not Present- Excessive bleeding, Gland problems, HIV and Persistent Infections.   Vitals Darlene Klein MD; 07/27/2014 12:52 PM) 07/27/2014 12:51 PM Weight: 222 lb Height: 68in Body Surface Area: 2.2 m Body Mass Index: 33.75 kg/m Temp.: 97.62F  Pulse: 54 (Regular)  Resp.: 18 (Unlabored)  BP: 145/54 (Sitting, Left Arm, Standard)    Physical Exam Darlene Klein MD; 07/27/2014 12:42 PM) General Mental Status-Alert. General Appearance-Consistent with stated age. Hydration-Well hydrated. Voice-Normal.  Head and Neck Head-normocephalic, atraumatic with no lesions or palpable masses. Trachea-midline. Thyroid Gland Characteristics - normal size and consistency.  Eye Eyeball - Bilateral-Extraocular movements intact. Sclera/Conjunctiva - Bilateral-No scleral icterus.  Chest and Lung Exam Chest and lung exam reveals -quiet, even and easy respiratory  effort with no use of accessory muscles and on auscultation, normal breath sounds, no adventitious sounds and normal vocal resonance. Inspection Chest Wall - Normal. Back - normal.  Breast Note: The patient has a palpable mass 2.5 cm at 10-11 o'clock. She does not have a palpable LN. no nipple retraction or skin dimpling is present. No nipple discharge. Breasts are symmetric bilaterally. There is some bruising at the breast and axillary biopsy sites. left breast is negative.   Cardiovascular Cardiovascular examination reveals -normal heart sounds, regular rate and rhythm with no murmurs and normal pedal pulses bilaterally.  Abdomen Inspection Inspection of the abdomen reveals - No Hernias. Palpation/Percussion Palpation and Percussion of the abdomen reveal - Soft, Non Tender, No Rebound tenderness, No Rigidity (guarding) and No hepatosplenomegaly. Auscultation Auscultation of the abdomen reveals - Bowel sounds normal.  Neurologic Neurologic evaluation reveals -alert and oriented x 3 with no impairment of recent or remote memory. Mental Status-Normal.  Musculoskeletal Global Assessment -Note: no gross deformities.  Normal Exam - Left-Upper Extremity Strength Normal and Lower Extremity Strength Normal. Normal Exam - Right-Upper Extremity Strength Normal and Lower Extremity Strength Normal.  Lymphatic Head & Neck  General Head & Neck Lymphatics: Bilateral - Description - Normal. Axillary  General Axillary Region: Bilateral - Description - Normal. Tenderness - Non Tender. Femoral & Inguinal  Generalized Femoral & Inguinal Lymphatics: Bilateral - Description - No Generalized lymphadenopathy.    Assessment & Plan Darlene Klein MD; 07/27/2014 12:51 PM) PRIMARY CANCER OF UPPER OUTER QUADRANT OF RIGHT FEMALE BREAST (174.4  C50.411) Impression: Patient has a new diagnosis of c T1cN1 right breast cancer. We recommend neoadjuvant chemotherapy. She is her 2 positive, and  has a good chance of a significant clinical response. She will get a breast MRI. She will also need a port a cath. She will get an echo and go to chemo class.  I discussed the port a cath with the patient. I reviewed the risks of surgery with her including bleeding, infection, damage to adjacent structures, pneumothorax, and more. She agrees to proceed.  She is a candidate for breast  conservation at this point unless we get surprises on the MRI. She is also a candidate for the Alliance trial. We will more fully discuss the pros and cons of this toward the end of chemo once we see how she responds. She is also going to see plastics to discuss the possibilities for symmetry and reconstruction.  45 min spent in examination, evaluation, counseling, and coordination of care. >50% spent in counseling.     Signed by Darlene Klein, MD (07/27/2014 12:53 PM)

## 2014-08-03 ENCOUNTER — Encounter (HOSPITAL_BASED_OUTPATIENT_CLINIC_OR_DEPARTMENT_OTHER): Payer: Self-pay | Admitting: General Surgery

## 2014-08-03 NOTE — Addendum Note (Signed)
Addendum  created 08/03/14 0847 by Maryella Shivers, CRNA   Modules edited: Charges VN

## 2014-08-04 ENCOUNTER — Encounter: Payer: BLUE CROSS/BLUE SHIELD | Admitting: Oncology

## 2014-08-04 ENCOUNTER — Ambulatory Visit (HOSPITAL_BASED_OUTPATIENT_CLINIC_OR_DEPARTMENT_OTHER): Payer: BLUE CROSS/BLUE SHIELD | Admitting: Oncology

## 2014-08-04 ENCOUNTER — Other Ambulatory Visit: Payer: Self-pay | Admitting: Oncology

## 2014-08-04 VITALS — BP 150/57 | HR 90 | Temp 98.3°F | Resp 18 | Ht 68.0 in | Wt 222.4 lb

## 2014-08-04 DIAGNOSIS — C50411 Malignant neoplasm of upper-outer quadrant of right female breast: Secondary | ICD-10-CM | POA: Diagnosis not present

## 2014-08-04 DIAGNOSIS — Z171 Estrogen receptor negative status [ER-]: Secondary | ICD-10-CM | POA: Diagnosis not present

## 2014-08-04 DIAGNOSIS — E119 Type 2 diabetes mellitus without complications: Secondary | ICD-10-CM | POA: Diagnosis not present

## 2014-08-04 DIAGNOSIS — C773 Secondary and unspecified malignant neoplasm of axilla and upper limb lymph nodes: Secondary | ICD-10-CM | POA: Diagnosis not present

## 2014-08-04 DIAGNOSIS — Z794 Long term (current) use of insulin: Secondary | ICD-10-CM

## 2014-08-04 MED ORDER — LIDOCAINE-PRILOCAINE 2.5-2.5 % EX CREA: TOPICAL_CREAM | CUTANEOUS | Status: DC

## 2014-08-04 MED ORDER — TOBRAMYCIN-DEXAMETHASONE 0.3-0.1 % OP SUSP: 1.0000 [drp] | Freq: Two times a day (BID) | OPHTHALMIC | Status: DC

## 2014-08-04 MED ORDER — PROCHLORPERAZINE MALEATE 10 MG PO TABS: ORAL_TABLET | ORAL | Status: DC

## 2014-08-04 MED ORDER — DEXAMETHASONE 4 MG PO TABS: ORAL_TABLET | ORAL | Status: DC

## 2014-08-04 MED ORDER — LORAZEPAM 0.5 MG PO TABS: 0.5000 mg | ORAL_TABLET | Freq: Every day | ORAL | Status: DC

## 2014-08-04 MED ORDER — ONDANSETRON HCL 8 MG PO TABS: 8.0000 mg | ORAL_TABLET | Freq: Two times a day (BID) | ORAL | Status: DC

## 2014-08-04 NOTE — Progress Notes (Signed)
[facility]  [phone] [phone]     ID: Darlene [name] DOB: 1951-01-01  MR#: [id number]  HCW#:[id number]  Patient Care Team: Thressa Sheller, MD as PCP - General (Internal Medicine) Bryson Ha Himmelrich, RD as Dietitian (Bariatrics) Stark Klein, MD as Consulting Physician (General Surgery) Chauncey Cruel, MD as Consulting Physician (Oncology) Arloa Koh, MD as Consulting Physician (Radiation Oncology) Mauro Kaufmann, RN as Registered Nurse Rockwell Germany, RN as Registered Nurse PCP: Thressa Sheller, MD GYN: Darlene [name] OTHER MD:  CHIEF COMPLAINT:  HER-2 positive , estrogen receptor negative breast cancer  CURRENT TREATMENT:  Neoadjuvant chemotherapy with anti-HER-2 treatment   BREAST CANCER HISTORY: From the original intake note:  "Darlene [name]" had screening mammography at her gynecologist suggestive of a possible abnormality in the right breast. However right diagnostic mammogram 04/12/2013 at [facility] was negative. More recently however, she noted a lump in her right breast and brought it to her gynecologist's attention. On 07/18/2014 the patient had bilateral diagnostic mammography with tomosynthesis at [facility]. The breast density was category B. In the right breast upper outer quadrant there was an area of focal asymmetry with indistinct margins.[name] Ultrasound was obtained and showed in addition to the mass in question, measuring 1.7 cm, an abnormal-appearing lymph node in the right axillary tail.  On 07/19/2014 the patient underwent biopsy of the right breast massin question as well as a right axillary lymph node. Both were positive for invasive ductal carcinoma, grade 2, estrogen and progesterone receptor negative, with an MIB-1 of 90%, and HER-2 pending. Incidentally the lymph node biopsy showed a lymphocytic inflammatory component but no lymph node architecture.  Her subsequent history is as detailed below.  INTERVAL HISTORY: Darlene [name]  returns today for follow-up of her rest cancer. After her visit with me we get the final report on her prognostic panel, and this showed HER-2 positivity, with a signals ratio of 2.4 to, the number per cell being 4.00. I call the patient and I told her that she would need chemotherapy and anti-HER-2 treatment. Since then the patient has undergone power port placement on 08/02/2014. She also had an echocardiogram on 07/29/2014 which showed an ejection fraction in the 60-65% range. She is here to discuss supportive treatment for her chemotherapy agents, which are scheduled to be given 08/08/2014.    was evaluated in the multidisciplinary breast cancer clinic 07/27/2014 accompanied by her husband Darlene [name]. Her case was also presented at the multidisciplinary breast cancer conference that same morning. At that time a preliminary plan was proposed: neoadjuvant chemotherapy/anti-HER-2 therapy followed by surgery with consideration of the Alliance Trial, followed by radiation.  REVIEW OF SYSTEMS: Aside from the mass itself, there were no specific symptoms leading to the diagnostic mammogram, The patient denies unusual headaches, visual changes, nausea, vomiting, stiff neck, dizziness, or gait imbalance. There has been no cough, phlegm production, or pleurisy, no chest pain or pressure, and no change in bowel or bladder habits. The patient denies fever, rash, bleeding, unexplained fatigue or unexplained weight loss. Her diabetes is much better controlled since she went through her Roux-en-Y surgery and lost 125 pounds. She does admit to some insomnia, hearing loss, seasonal sinus allergies, and arthritis. Her history of depression is managed through Dr. Caprice Beaver. A detailed review of systems was otherwise entirely negative.  PAST MEDICAL HISTORY: Past Medical History  Diagnosis Date  . Obesity   . Hypertension   . Hyperlipidemia   . Obstructive sleep apnea on CPAP   . Diabetes  mellitus without complication      78+ years  . Eating disorder     binge eating  . Balance problem   . Arthritis     in fingers, shoulders  . Anemia     hx of  . Depression   . Ovarian cyst rupture     possible  . Clumsiness   . Breast cancer of upper-outer quadrant of right female breast 07/22/2014  . Breast cancer   . Anxiety   . Neuromuscular disorder     PAST SURGICAL HISTORY: Past Surgical History  Procedure Laterality Date  . Carpal tunnel release Right   . Carpal tunnel release Left   . Breath tek h pylori N/A 09/15/2012    Procedure: BREATH TEK H PYLORI;  Surgeon: Pedro Earls, MD;  Location: [facility];  Service: General;  Laterality: N/A;  . Dupuytren contracture release    . Trigger finger release      x3  . Toe surgery  1970s    bone spur  . Rotator cuff repair Right 2005  . Gastric roux-en-y N/A 11/02/2012    Procedure: LAPAROSCOPIC ROUX-EN-Y GASTRIC;  Surgeon: Pedro Earls, MD;  Location: [facility];  Service: General;  Laterality: N/A;  . Bunionectomy Left 12/2013  . Portacath placement Left 08/02/2014    Procedure: INSERTION PORT-A-CATH;  Surgeon: Stark Klein, MD;  Location: [facility];  Service: General;  Laterality: Left;    FAMILY HISTORY Family History  Problem Relation Age of Onset  . CVA Mother   . Diabetes Father   . Heart failure Father   . Hypertension Sister   . Diabetes Brother   . Diabetes Paternal Uncle    the patient's father died from complications of diabetes at age 39. The patient's mother died at age 27 with a subarachnoid hemorrhage. The patient had one brother, one sister. The only breast cancer was the patient's paternal grandmother diagnosed in her 45s. There is no history of ovarian cancer in the family.  GYNECOLOGIC HISTORY:  Patient's last menstrual period was 10/22/2007.  menarche age 42, the patient is GX P0. She went through the change of life in 2011. She did not take hormone replacement.  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 Her husband Darlene [name] is a retired Visual merchandiser (he taught at [employer]).  Darlene [name] has 2 children from an earlier marriage, Rockey Situ who teaches in [place], and Dawanna Grauberger,  Who will works at the [employer] in in [place]. He has 1 grand son, aged 38. The patient and her husband are not church attender's    ADVANCED DIRECTIVES:  Not in place   HEALTH MAINTENANCE: History  Substance Use Topics  . Smoking status: Never Smoker   . Smokeless tobacco: Never Used  . Alcohol Use: 0.0 oz/week    0 Standard drinks or equivalent per week     Colonoscopy:  May 2016  PAP:  Bone density: 07/05/2013 normal, with a T score of 0.4  Lipid panel:  Allergies  Allergen Reactions  . Other Itching    Peanuts in large quantities     Current Outpatient Prescriptions  Medication Sig Dispense Refill  . acetaminophen (TYLENOL) 500 MG tablet Take 500 mg by mouth every 6 (six) hours as needed for pain.    [name] aspirin 81 MG tablet Take 81 mg by mouth daily.      . Calcium Citrate-Vitamin D (  CALCIUM CITRATE+D3 PO) Take 1,500 mg by mouth daily.    [name] Sodium (COLACE PO) Take 1 capsule by mouth 2 (two) times daily.     [name] glucose blood (ONETOUCH VERIO) test strip 1 each by Other route as needed for other. Use as instructed    . ibuprofen (ADVIL,MOTRIN) 200 MG tablet Take 200 mg by mouth every 6 (six) hours as needed for pain.    [name] losartan (COZAAR) 100 MG tablet Take 1 tablet by mouth daily.     . mometasone (NASONEX) 50 MCG/ACT nasal spray Place 2 sprays into the nose daily as needed.      . Multiple Vitamin (MULTIVITAMIN WITH MINERALS) TABS tablet Take 1 tablet by mouth daily. With iron    . NOVOLIN 70/30 (70-30) 100 UNIT/ML injection 10 units two times a day    . oxyCODONE-acetaminophen (ROXICET) 5-325 MG per tablet Take 1-2 tablets by mouth every 4 (four) hours as needed for severe pain. 30 tablet 0  . Vortioxetine  HBr (BRINTELLIX) 10 MG TABS Take 10 mg by mouth every morning.      No current facility-administered medications for this visit.    OBJECTIVE:  Middle-aged white woman who appears older than stated age There were no vitals filed for this visit.   There is no weight on file to calculate BMI.    ECOG FS:1 - Symptomatic but completely ambulatory  Ocular: Sclerae unicteric, pupils equal, round and reactive to light Ear-nose-throat: Oropharynx clear and moist Lymphatic: No cervical or supraclavicular adenopathy Lungs no rales or rhonchi, good excursion bilaterally Heart regular rate and rhythm, no murmur appreciated Abd soft, nontender, positive bowel sounds MSK no focal spinal tenderness, no joint edema Neuro: non-focal, well-oriented, appropriate affect Breasts:  There is a mass in the lower outer quadrant of the right breast which is easily palpable. It measures approximately 2 cm. It is movable, and not associated with skin erythema. The rest of the breast is unremarkable. The right axilla is benign. The left breast is unremarkable.   LAB RESULTS:  CMP     Component Value Date/Time   NA 141 07/27/2014 0819   NA 140 02/18/2013 0827   K 4.6 07/27/2014 0819   K 4.3 02/18/2013 0827   CL 104 02/18/2013 0827   CO2 27 07/27/2014 0819   CO2 25 02/18/2013 0827   GLUCOSE 232* 07/27/2014 0819   GLUCOSE 168* 02/18/2013 0827   BUN 35.7* 07/27/2014 0819   BUN 26* 02/18/2013 0827   CREATININE 1.3* 07/27/2014 0819   CREATININE 1.10 02/18/2013 0827   CREATININE 1.37* 11/02/2012 1230   CALCIUM 9.2 07/27/2014 0819   CALCIUM 9.5 02/18/2013 0827   PROT 6.2* 07/27/2014 0819   PROT 6.3 02/18/2013 0827   ALBUMIN 3.6 07/27/2014 0819   ALBUMIN 3.9 02/18/2013 0827   AST 29 07/27/2014 0819   AST 23 02/18/2013 0827   ALT 53 07/27/2014 0819   ALT 36* 02/18/2013 0827   ALKPHOS 71 07/27/2014 0819   ALKPHOS 68 02/18/2013 0827   BILITOT 0.71 07/27/2014 0819   BILITOT 0.9 02/18/2013 0827   GFRNONAA  55* 02/18/2013 0827   GFRNONAA 41* 11/02/2012 1230   GFRAA 63 02/18/2013 0827   GFRAA 48* 11/02/2012 1230    INo results found for: SPEP, UPEP  Lab Results  Component Value Date   WBC 6.6 07/27/2014   NEUTROABS 3.9 07/27/2014   HGB 11.8 07/27/2014   HCT 35.0 07/27/2014   MCV 91.7 07/27/2014   PLT 220 07/27/2014  Chemistry      Component Value Date/Time   NA 141 07/27/2014 0819   NA 140 02/18/2013 0827   K 4.6 07/27/2014 0819   K 4.3 02/18/2013 0827   CL 104 02/18/2013 0827   CO2 27 07/27/2014 0819   CO2 25 02/18/2013 0827   BUN 35.7* 07/27/2014 0819   BUN 26* 02/18/2013 0827   CREATININE 1.3* 07/27/2014 0819   CREATININE 1.10 02/18/2013 0827   CREATININE 1.37* 11/02/2012 1230      Component Value Date/Time   CALCIUM 9.2 07/27/2014 0819   CALCIUM 9.5 02/18/2013 0827   ALKPHOS 71 07/27/2014 0819   ALKPHOS 68 02/18/2013 0827   AST 29 07/27/2014 0819   AST 23 02/18/2013 0827   ALT 53 07/27/2014 0819   ALT 36* 02/18/2013 0827   BILITOT 0.71 07/27/2014 0819   BILITOT 0.9 02/18/2013 0827       No results found for: LABCA2  No components found for: LOVFI433  No results for input(s): INR in the last 168 hours.  Urinalysis    Component Value Date/Time   BILIRUBINUR neg 06/29/2013 1017   PROTEINUR trace 06/29/2013 1017   UROBILINOGEN negative 06/29/2013 1017   NITRITE neg 06/29/2013 1017   LEUKOCYTESUR Negative 06/29/2013 1017    STUDIES: Transthoracic Echocardiography  Patient:  [place], [place] MR #:    [id number] Study Date: 07/29/2014 Gender:   F Age:    18 Height:   172.7 cm Weight:   100.7 kg BSA:    2.23 m^2 Pt. Status: Room:  ATTENDING  Magrinat, Valli Glance   Magrinat, Virgie Dad REFERRING  Magrinat, Virgie Dad SONOGRAPHER Diamond Nickel PERFORMING  [facility], Outpatient  cc:  ------------------------------------------------------------------- LV EF: 60% -   65%  ------------------  ASSESSMENT: 64 y.o. [place] woman status post right breast and right axillary lymph node biopsy 07/19/2014 for a cT1c  pN1, stage IIA  invasive ductal carcinoma, grade 2, estrogen and progesterone receptor negative, with an MIB-1 of 90%, and HER-2 positive  (1) neoadjuvant chemotherapy will consist of carboplatin, docetaxel, trastuzumab and pertuzumab given every 3 weeks 6, with Neulasta support  (2) trastuzumab to be continued to complete one year  (3) Definitive surgery to follow chemotherapy, with consideration of the Alliance trial  (4) adjuvant radiation to follow surgery   PLAN: We spent the better part of today's hour-long appointment discussing the biology of breast cancer in general, and the specifics of the patient's tumor in particular. Darlene [name] understands there is no survival difference between receiving chemotherapy first and then surgery, as opposed to surgery first and then chemotherapy. In her case we are suggesting neoadjuvant treatment because it optimizes her anti-HER-2 treatment and it gives her the opportunity of participating in the Alliance trial, which may spare her a complete axillary dissection. She was able to understand these benefits and agrees.  We then discussed the specifics of her chemotherapy which will consist of carboplatin, docetaxel, trastuzumab, and pertuzumab. She understands the possible toxicities, side effects and complications of these agents. In her case and particularly concerned regarding the diabetes both because of sugar control and because of concerns regarding neuropathy.  For that reason we are going to do Decadron only the day before chemotherapy. On the day of chemotherapy of course she will also receive steroids. We are omitting the steroids though from days 23 and 4. Instead she will use ondansetron and prochlorperazine for nausea control those days. Clearly 4 days 0, 1 and 2 she will need to check  her blood sugar 4  times a day and follow a sliding scale to make sure we don't and up with very high values.  I have encouraged her to increase her exercise daily from 20 minutes a day to 40. She of course will need a port, and echocardiogram, and a baseline MRI. She will also come to chemotherapy school. She will see me before starting chemotherapy to discuss supportive therapy and make sure she has all her prescriptions inhale.  Unfortunately we were not able to coordinate her chemotherapy treatments so that she would be able to keep her trip to [place] the first week in September. I have written her a note requesting that they either give her a refund or allow her to reschedule without penalty.  Darlene [name] has a good understanding of the overall plan. She agrees with it. She knows the goal of treatment in her case is cure. She will call with any problems that may develop before her next visit here.  Chauncey Cruel, MD   08/04/2014 7:58 AM Medical Oncology and Hematology [facility] [address] [place], [place] [address] Tel. [phone]    Fax. [phone]

## 2014-08-04 NOTE — Progress Notes (Signed)
[place]  [phone] [phone]     ID: YSABELLA BABIARZ DOB: [birth year]  MR#: [id number]  NTI#:[id number]  Patient Care Team: Thressa Sheller, MD as PCP - General (Internal Medicine) Bryson Ha Himmelrich, RD as Dietitian (Bariatrics) Stark Klein, MD as Consulting Physician (General Surgery) Chauncey Cruel, MD as Consulting Physician (Oncology) Arloa Koh, MD as Consulting Physician (Radiation Oncology) Mauro Kaufmann, RN as Registered Nurse Rockwell Germany, RN as Registered Nurse PCP: Thressa Sheller, MD GYN: Kem Boroughs FNP OTHER MD:  CHIEF COMPLAINT:  HER-2 positive , estrogen receptor negative breast cancer  CURRENT TREATMENT:  Neoadjuvant chemotherapy with anti-HER-2 treatment   BREAST CANCER HISTORY: From the original intake note:  "[name]" had screening mammography at her gynecologist suggestive of a possible abnormality in the right breast. However right diagnostic mammogram 04/12/2013 at [facility] was negative. More recently however, she noted a lump in her right breast and brought it to her gynecologist's attention. On 07/18/2014 the patient had bilateral diagnostic mammography with tomosynthesis at [facility]. The breast density was category B. In the right breast upper outer quadrant there was an area of focal asymmetry with indistinct margins.[name] Ultrasound was obtained and showed in addition to the mass in question, measuring 1.7 cm, an abnormal-appearing lymph node in the right axillary tail.  On 07/19/2014 the patient underwent biopsy of the right breast massin question as well as a right axillary lymph node. Both were positive for invasive ductal carcinoma, grade 2, estrogen and progesterone receptor negative, with an MIB-1 of 90%, and HER-2 pending. Incidentally the lymph node biopsy showed a lymphocytic inflammatory component but no lymph node architecture.  Her subsequent history is as detailed below.  INTERVAL HISTORY: [name]  returns today for follow-up of her rest cancer. After her visit with me we get the final report on her prognostic panel, and this showed HER-2 positivity, with a signals ratio of 2.4 to, the number per cell being 4.00. I call the patient and I told her that she would need chemotherapy and anti-HER-2 treatment. Since then the patient has undergone power port placement on 08/02/2014. She also had an echocardiogram on 07/29/2014 which showed an ejection fraction in the 60-65% range. Finally she had a baseline MRI. This shows a more extensive disease than seen on her prior imaging. Specifically in the left long this spiculated mass in the right lateral central breast measures up to 3.7 cm and it is part of a larger area of non-masslike enhancement which extends up to 6.9 cm. There also seemed to be a few millimeter satellite nodules anteriorly. She is here to discuss supportive treatment for her chemotherapy agents, which are scheduled to be given 08/08/2014.  REVIEW OF SYSTEMS: [name] tolerated port placement without any major complications. She still has a little bit of soreness, but that's all. Her sugars have been "up and down" because as she confesses she has been eating very irregularly. This is because she feels anxious about the upcoming treatments. She is scheduled to see Dr. Amanda Cockayne within the next few days to look at her blood sugars. She does admit to anxiety and depression. She just lost a favorite uncle in [place] and will not be able to go to the funeral. On the other hand many family members are coming up to visit her, and she appreciates that. Otherwise a detailed review of systems today was stable  PAST MEDICAL HISTORY: Past Medical History  Diagnosis Date  . Obesity   . Hypertension   .  Hyperlipidemia   . Obstructive sleep apnea on CPAP   . Diabetes mellitus without complication     62+ years  . Eating disorder     binge eating  . Balance problem   . Arthritis     in fingers,  shoulders  . Anemia     hx of  . Depression   . Ovarian cyst rupture     possible  . Clumsiness   . Breast cancer of upper-outer quadrant of right female breast 07/22/2014  . Breast cancer   . Anxiety   . Neuromuscular disorder     PAST SURGICAL HISTORY: Past Surgical History  Procedure Laterality Date  . Carpal tunnel release Right   . Carpal tunnel release Left   . Breath tek h pylori N/A 09/15/2012    Procedure: BREATH TEK H PYLORI;  Surgeon: Pedro Earls, MD;  Location: [facility];  Service: General;  Laterality: N/A;  . Dupuytren contracture release    . Trigger finger release      x3  . Toe surgery  1970s    bone spur  . Rotator cuff repair Right 2005  . Gastric roux-en-y N/A 11/02/2012    Procedure: LAPAROSCOPIC ROUX-EN-Y GASTRIC;  Surgeon: Pedro Earls, MD;  Location: [facility];  Service: General;  Laterality: N/A;  . Bunionectomy Left 12/2013  . Portacath placement Left 08/02/2014    Procedure: INSERTION PORT-A-CATH;  Surgeon: Stark Klein, MD;  Location: [facility];  Service: General;  Laterality: Left;    FAMILY HISTORY Family History  Problem Relation Age of Onset  . CVA Mother   . Diabetes Father   . Heart failure Father   . Hypertension Sister   . Diabetes Brother   . Diabetes Paternal Uncle    the patient's father died from complications of diabetes at age 68. The patient's mother died at age 70 with a subarachnoid hemorrhage. The patient had one brother, one sister. The only breast cancer was the patient's paternal grandmother diagnosed in her 84s. There is no history of ovarian cancer in the family.  GYNECOLOGIC HISTORY:  Patient's last menstrual period was 10/22/2007.  menarche age 40, the patient is GX P0. She went through the change of life in 2011. She did not take hormone replacement.  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 Her husband [name] is a retired  Visual merchandiser (he taught at [employer]).  [name] has 2 children from an earlier marriage, Rockey Situ who teaches in [place], and Talani Brazee,  who works at the [employer] in in [place]. He has 1 grand son, aged 6. The patient and her husband are not church attender's    ADVANCED DIRECTIVES:  Not in place   HEALTH MAINTENANCE: History  Substance Use Topics  . Smoking status: Never Smoker   . Smokeless tobacco: Never Used  . Alcohol Use: 0.0 oz/week    0 Standard drinks or equivalent per week     Colonoscopy:  May 2016  PAP:  Bone density: 07/05/2013 normal, with a T score of 0.4  Lipid panel:  Allergies  Allergen Reactions  . Other Itching    Peanuts in large quantities     Current Outpatient Prescriptions  Medication Sig Dispense Refill  . acetaminophen (TYLENOL) 500 MG tablet Take 500 mg by mouth every 6 (six) hours as needed for pain.    [name] aspirin 81 MG tablet Take 81 mg  by mouth daily.      . Calcium Citrate-Vitamin D (CALCIUM CITRATE+D3 PO) Take 1,500 mg by mouth daily.    [name] dexamethasone (DECADRON) 4 MG tablet Take 2 tablets twice daily with food the day BEFORE chemo. 30 tablet 1  . Docusate Sodium (COLACE PO) Take 1 capsule by mouth 2 (two) times daily.     [name] glucose blood (ONETOUCH VERIO) test strip 1 each by Other route as needed for other. Use as instructed    . ibuprofen (ADVIL,MOTRIN) 200 MG tablet Take 200 mg by mouth every 6 (six) hours as needed for pain.    [name] lidocaine-prilocaine (EMLA) cream Apply to affected area once 30 g 3  . lidocaine-prilocaine (EMLA) cream Apply to affected area once 30 g 3  . LORazepam (ATIVAN) 0.5 MG tablet Take 1 tablet (0.5 mg total) by mouth at bedtime. 30 tablet 0  . losartan (COZAAR) 100 MG tablet Take 1 tablet by mouth daily.     . mometasone (NASONEX) 50 MCG/ACT nasal spray Place 2 sprays into the nose daily as needed.      . Multiple Vitamin (MULTIVITAMIN WITH MINERALS) TABS tablet Take 1 tablet by mouth  daily. With iron    . NOVOLIN 70/30 (70-30) 100 UNIT/ML injection 10 units two times a day    . ondansetron (ZOFRAN) 8 MG tablet Take 1 tablet (8 mg total) by mouth 2 (two) times daily. Start the day after chemo for 3 days. Then take as needed for nausea or vomiting. 30 tablet 1  . oxyCODONE-acetaminophen (ROXICET) 5-325 MG per tablet Take 1-2 tablets by mouth every 4 (four) hours as needed for severe pain. 30 tablet 0  . prochlorperazine (COMPAZINE) 10 MG tablet Take one table before meals and at bedtime (4 times a day) starint the evening of chemo and continuing for 3 full days 30 tablet 1  . tobramycin-dexamethasone (TOBRADEX) ophthalmic solution Place 1 drop into both eyes 2 (two) times daily. 5 mL 1  . Vortioxetine HBr (BRINTELLIX) 10 MG TABS Take 10 mg by mouth every morning.      No current facility-administered medications for this visit.    OBJECTIVE:  Middle-aged white woman in no acute distress Filed Vitals:   08/04/14 1517  BP: 150/57  Pulse: 90  Temp: 98.3 F (36.8 C)  Resp: 18     Body mass index is 33.82 kg/(m^2).    ECOG FS:1 - Symptomatic but completely ambulatory  Sclerae unicteric, pupils round and equal Oropharynx clear and moist-- no thrush or other lesions No cervical or supraclavicular adenopathy Lungs no rales or rhonchi Heart regular rate and rhythm Abd soft, nontender, positive bowel sounds MSK no focal spinal tenderness, no upper extremity lymphedema Neuro: nonfocal, well oriented, appropriate affect Breasts: Deferred Skin: The port is easily palpable, the incision shows no dehiscence, erythema, or swelling.   LAB RESULTS:  CMP     Component Value Date/Time   NA 141 07/27/2014 0819   NA 140 02/18/2013 0827   K 4.6 07/27/2014 0819   K 4.3 02/18/2013 0827   CL 104 02/18/2013 0827   CO2 27 07/27/2014 0819   CO2 25 02/18/2013 0827   GLUCOSE 232* 07/27/2014 0819   GLUCOSE 168* 02/18/2013 0827   BUN 35.7* 07/27/2014 0819   BUN 26* 02/18/2013 0827     CREATININE 1.3* 07/27/2014 0819   CREATININE 1.10 02/18/2013 0827   CREATININE 1.37* 11/02/2012 1230   CALCIUM 9.2 07/27/2014 0819   CALCIUM 9.5 02/18/2013 0827  PROT 6.2* 07/27/2014 0819   PROT 6.3 02/18/2013 0827   ALBUMIN 3.6 07/27/2014 0819   ALBUMIN 3.9 02/18/2013 0827   AST 29 07/27/2014 0819   AST 23 02/18/2013 0827   ALT 53 07/27/2014 0819   ALT 36* 02/18/2013 0827   ALKPHOS 71 07/27/2014 0819   ALKPHOS 68 02/18/2013 0827   BILITOT 0.71 07/27/2014 0819   BILITOT 0.9 02/18/2013 0827   GFRNONAA 55* 02/18/2013 0827   GFRNONAA 41* 11/02/2012 1230   GFRAA 63 02/18/2013 0827   GFRAA 48* 11/02/2012 1230    INo results found for: SPEP, UPEP  Lab Results  Component Value Date   WBC 6.6 07/27/2014   NEUTROABS 3.9 07/27/2014   HGB 11.8 07/27/2014   HCT 35.0 07/27/2014   MCV 91.7 07/27/2014   PLT 220 07/27/2014      Chemistry      Component Value Date/Time   NA 141 07/27/2014 0819   NA 140 02/18/2013 0827   K 4.6 07/27/2014 0819   K 4.3 02/18/2013 0827   CL 104 02/18/2013 0827   CO2 27 07/27/2014 0819   CO2 25 02/18/2013 0827   BUN 35.7* 07/27/2014 0819   BUN 26* 02/18/2013 0827   CREATININE 1.3* 07/27/2014 0819   CREATININE 1.10 02/18/2013 0827   CREATININE 1.37* 11/02/2012 1230      Component Value Date/Time   CALCIUM 9.2 07/27/2014 0819   CALCIUM 9.5 02/18/2013 0827   ALKPHOS 71 07/27/2014 0819   ALKPHOS 68 02/18/2013 0827   AST 29 07/27/2014 0819   AST 23 02/18/2013 0827   ALT 53 07/27/2014 0819   ALT 36* 02/18/2013 0827   BILITOT 0.71 07/27/2014 0819   BILITOT 0.9 02/18/2013 0827       No results found for: LABCA2  No components found for: AGTXM468  No results for input(s): INR in the last 168 hours.  Urinalysis    Component Value Date/Time   BILIRUBINUR neg 06/29/2013 1017   PROTEINUR trace 06/29/2013 1017   UROBILINOGEN negative 06/29/2013 1017   NITRITE neg 06/29/2013 1017   LEUKOCYTESUR Negative 06/29/2013 1017     STUDIES: Transthoracic Echocardiography  Patient:  Marwah, Disbro MR #:    [id number] Study Date: 07/29/2014 Gender:   F Age:    38 Height:   172.7 cm Weight:   100.7 kg BSA:    2.23 m^2 Pt. Status: Room:  ATTENDING  Magrinat, Valli Glance   Magrinat, Virgie Dad REFERRING  Magrinat, Virgie Dad SONOGRAPHER Diamond Nickel PERFORMING  [facility], Outpatient  cc:  ------------------------------------------------------------------- LV EF: 60% -  65%  ------------------  ASSESSMENT: 64 y.o. [place] woman status post right breast and right axillary lymph node biopsy 07/19/2014 for a cT2  pN1, stage IIB  invasive ductal carcinoma, grade 2, estrogen and progesterone receptor negative, with an MIB-1 of 90%, and HER-2 positive  (1) neoadjuvant chemotherapy will consist of carboplatin, docetaxel, trastuzumab and pertuzumab given every 3 weeks 6, with onpro support, first dose 08/08/2014  (2) trastuzumab to be continued to complete one year  (a) echo 07/29/2014 shows a normal ejection fraction  (3) Definitive surgery to follow chemotherapy, with consideration of the [facility] trial  (4) adjuvant radiation to follow surgery   PLAN: I spent approximately 45 minutes with [name] and her husband going over first of all the results of her recent studies. These showed a normal ejection fraction. It also shows that her tumor, by MRI, is larger than originally imaged. They understand this does not mean  the tumor grew, only that we have a much clearer idea now of the actual size.  We then went over the "roadmap" regarding how to take her medications. Generally we use dexamethasone as the main antinausea agent, but in this case because of the diabetes problem we are only going to use dexamethasone the day before treatment and the day of treatment. After that she will take ondansetron and prochlorperazine as her main antinausea medications. We also reviewed the  other supportive metastases and the rationale for that. All those prescriptions have been placed.  We discussed onpro and she would prefer to receive rad rather than having to come back a couple of days later just to receive a shot.  The main concern I have is the diabetes. She is already on insulin, longer acting, twice daily, before breakfast and before supper. I have asked her to start checking her blood sugars before breakfast, lunch, supper, and at bedtime. Starting on Sunday, which will be day 0 of her first cycle, she will also have a sliding scale, which I wrote for her. Specifically that's this states that for glucoses between 0 and 200 she will give no added normal limits.  For a glucose of She will give herself 201-250  6 units novolin (additional to her usual) 251-300  8 UNITS 301-350  10 UNITS >350   12 units  She will also keep a detailed record of other side effects and problems that she has the first week. This will allows to troubleshoot better as we try to make her second cycle safer and easier on her.  After she completes 3 cycles, before she sees me with cycle 4, she will have a restaging breast MRI.  Incidentally [name] brought me and authorization form so we can release data to her insurance as required.  [name] has a good understanding of the overall plan. She agrees with it. She knows the goal of treatment in her case is cure. She will call with any problems that may develop before her next visit here.  Chauncey Cruel, MD   08/04/2014 3:23 PM Medical Oncology and Hematology [facility] [address] [place], [place] [address] Tel. [phone]    Fax. [phone]

## 2014-08-05 ENCOUNTER — Encounter: Payer: Self-pay | Admitting: *Deleted

## 2014-08-05 ENCOUNTER — Other Ambulatory Visit: Payer: Self-pay | Admitting: Nurse Practitioner

## 2014-08-08 ENCOUNTER — Other Ambulatory Visit: Payer: Self-pay | Admitting: Oncology

## 2014-08-08 ENCOUNTER — Other Ambulatory Visit (HOSPITAL_BASED_OUTPATIENT_CLINIC_OR_DEPARTMENT_OTHER): Payer: BLUE CROSS/BLUE SHIELD

## 2014-08-08 ENCOUNTER — Encounter: Payer: Self-pay | Admitting: *Deleted

## 2014-08-08 ENCOUNTER — Ambulatory Visit (HOSPITAL_BASED_OUTPATIENT_CLINIC_OR_DEPARTMENT_OTHER): Payer: BLUE CROSS/BLUE SHIELD

## 2014-08-08 VITALS — BP 156/64 | HR 51 | Temp 97.6°F | Resp 16

## 2014-08-08 DIAGNOSIS — Z5189 Encounter for other specified aftercare: Secondary | ICD-10-CM | POA: Diagnosis not present

## 2014-08-08 DIAGNOSIS — C50411 Malignant neoplasm of upper-outer quadrant of right female breast: Secondary | ICD-10-CM

## 2014-08-08 DIAGNOSIS — E119 Type 2 diabetes mellitus without complications: Secondary | ICD-10-CM

## 2014-08-08 DIAGNOSIS — Z5112 Encounter for antineoplastic immunotherapy: Secondary | ICD-10-CM

## 2014-08-08 DIAGNOSIS — Z5111 Encounter for antineoplastic chemotherapy: Secondary | ICD-10-CM

## 2014-08-08 DIAGNOSIS — C773 Secondary and unspecified malignant neoplasm of axilla and upper limb lymph nodes: Secondary | ICD-10-CM | POA: Diagnosis not present

## 2014-08-08 LAB — CBC WITH DIFFERENTIAL/PLATELET
BASO%: 0.1 % (ref 0.0–2.0)
Basophils Absolute: 0 10*3/uL (ref 0.0–0.1)
EOS%: 0 % (ref 0.0–7.0)
Eosinophils Absolute: 0 10*3/uL (ref 0.0–0.5)
HCT: 35.5 % (ref 34.8–46.6)
HGB: 12 g/dL (ref 11.6–15.9)
LYMPH%: 11.8 % — ABNORMAL LOW (ref 14.0–49.7)
MCH: 30.7 pg (ref 25.1–34.0)
MCHC: 33.8 g/dL (ref 31.5–36.0)
MCV: 90.8 fL (ref 79.5–101.0)
MONO#: 0.5 10*3/uL (ref 0.1–0.9)
MONO%: 5.4 % (ref 0.0–14.0)
NEUT#: 8.4 10*3/uL — ABNORMAL HIGH (ref 1.5–6.5)
NEUT%: 82.7 % — ABNORMAL HIGH (ref 38.4–76.8)
Platelets: 216 10*3/uL (ref 145–400)
RBC: 3.91 10*6/uL (ref 3.70–5.45)
RDW: 13.1 % (ref 11.2–14.5)
WBC: 10.1 10*3/uL (ref 3.9–10.3)
lymph#: 1.2 10*3/uL (ref 0.9–3.3)

## 2014-08-08 LAB — COMPREHENSIVE METABOLIC PANEL (CC13)
ALT: 53 U/L (ref 0–55)
AST: 26 U/L (ref 5–34)
Albumin: 3.7 g/dL (ref 3.5–5.0)
Alkaline Phosphatase: 76 U/L (ref 40–150)
Anion Gap: 10 mEq/L (ref 3–11)
BUN: 32 mg/dL — ABNORMAL HIGH (ref 7.0–26.0)
CO2: 22 mEq/L (ref 22–29)
Calcium: 9.5 mg/dL (ref 8.4–10.4)
Chloride: 107 mEq/L (ref 98–109)
Creatinine: 1.2 mg/dL — ABNORMAL HIGH (ref 0.6–1.1)
EGFR: 48 mL/min/{1.73_m2} — ABNORMAL LOW (ref >90)
Glucose: 296 mg/dl — ABNORMAL HIGH (ref 70–140)
Potassium: 4.2 mEq/L (ref 3.5–5.1)
Sodium: 139 mEq/L (ref 136–145)
Total Bilirubin: 0.76 mg/dL (ref 0.20–1.20)
Total Protein: 6.7 g/dL (ref 6.4–8.3)

## 2014-08-08 MED ORDER — TRASTUZUMAB CHEMO INJECTION 440 MG
8.0000 mg/kg | Freq: Once | INTRAVENOUS | Status: AC
  Administered 2014-08-08: 798 mg via INTRAVENOUS
  Filled 2014-08-08: qty 38

## 2014-08-08 MED ORDER — SODIUM CHLORIDE 0.9 % IV SOLN
Freq: Once | INTRAVENOUS | Status: AC
  Administered 2014-08-08: 15:00:00 via INTRAVENOUS
  Filled 2014-08-08: qty 8

## 2014-08-08 MED ORDER — SODIUM CHLORIDE 0.9 % IJ SOLN
10.0000 mL | INTRAMUSCULAR | Status: DC | PRN
  Administered 2014-08-08: 10 mL
  Filled 2014-08-08: qty 10

## 2014-08-08 MED ORDER — ACETAMINOPHEN 325 MG PO TABS
ORAL_TABLET | ORAL | Status: AC
  Filled 2014-08-08: qty 2

## 2014-08-08 MED ORDER — DIPHENHYDRAMINE HCL 25 MG PO CAPS
ORAL_CAPSULE | ORAL | Status: AC
  Filled 2014-08-08: qty 1

## 2014-08-08 MED ORDER — SODIUM CHLORIDE 0.9 % IV SOLN
Freq: Once | INTRAVENOUS | Status: AC
  Administered 2014-08-08: 10:00:00 via INTRAVENOUS

## 2014-08-08 MED ORDER — HEPARIN SOD (PORK) LOCK FLUSH 100 UNIT/ML IV SOLN
500.0000 [IU] | Freq: Once | INTRAVENOUS | Status: AC | PRN
  Administered 2014-08-08: 500 [IU]
  Filled 2014-08-08: qty 5

## 2014-08-08 MED ORDER — ACETAMINOPHEN 325 MG PO TABS
650.0000 mg | ORAL_TABLET | Freq: Once | ORAL | Status: AC
  Administered 2014-08-08: 650 mg via ORAL

## 2014-08-08 MED ORDER — PEGFILGRASTIM 6 MG/0.6ML ~~LOC~~ PSKT
6.0000 mg | PREFILLED_SYRINGE | Freq: Once | SUBCUTANEOUS | Status: AC
  Administered 2014-08-08: 6 mg via SUBCUTANEOUS
  Filled 2014-08-08: qty 0.6

## 2014-08-08 MED ORDER — SODIUM CHLORIDE 0.9 % IV SOLN
840.0000 mg | Freq: Once | INTRAVENOUS | Status: AC
  Administered 2014-08-08: 840 mg via INTRAVENOUS
  Filled 2014-08-08: qty 28

## 2014-08-08 MED ORDER — DIPHENHYDRAMINE HCL 25 MG PO CAPS
25.0000 mg | ORAL_CAPSULE | Freq: Once | ORAL | Status: AC
  Administered 2014-08-08: 25 mg via ORAL

## 2014-08-08 MED ORDER — SODIUM CHLORIDE 0.9 % IV SOLN
517.0000 mg | Freq: Once | INTRAVENOUS | Status: AC
  Administered 2014-08-08: 520 mg via INTRAVENOUS
  Filled 2014-08-08: qty 52

## 2014-08-08 MED ORDER — SODIUM CHLORIDE 0.9 % IV SOLN
75.0000 mg/m2 | Freq: Once | INTRAVENOUS | Status: AC
  Administered 2014-08-08: 170 mg via INTRAVENOUS
  Filled 2014-08-08: qty 17

## 2014-08-08 NOTE — Patient Instructions (Signed)
Carboplatin injection What is this medicine? CARBOPLATIN (KAR boe pla tin) is a chemotherapy drug. It targets fast dividing cells, like cancer cells, and causes these cells to die. This medicine is used to treat ovarian cancer and many other cancers. This medicine may be used for other purposes; ask your health care provider or pharmacist if you have questions. COMMON BRAND NAME(S): Paraplatin What should I tell my health care provider before I take this medicine? They need to know if you have any of these conditions: -blood disorders -hearing problems -kidney disease -recent or ongoing radiation therapy -an unusual or allergic reaction to carboplatin, cisplatin, other chemotherapy, other medicines, foods, dyes, or preservatives -pregnant or trying to get pregnant -breast-feeding How should I use this medicine? This drug is usually given as an infusion into a vein. It is administered in a hospital or clinic by a specially trained health care professional. Talk to your pediatrician regarding the use of this medicine in children. Special care may be needed. Overdosage: If you think you have taken too much of this medicine contact a poison control center or emergency room at once. NOTE: This medicine is only for you. Do not share this medicine with others. What if I miss a dose? It is important not to miss a dose. Call your doctor or health care professional if you are unable to keep an appointment. What may interact with this medicine? -medicines for seizures -medicines to increase blood counts like filgrastim, pegfilgrastim, sargramostim -some antibiotics like amikacin, gentamicin, neomycin, streptomycin, tobramycin -vaccines Talk to your doctor or health care professional before taking any of these medicines: -acetaminophen -aspirin -ibuprofen -ketoprofen -naproxen This list may not describe all possible interactions. Give your health care provider a list of all the medicines, herbs,  non-prescription drugs, or dietary supplements you use. Also tell them if you smoke, drink alcohol, or use illegal drugs. Some items may interact with your medicine. What should I watch for while using this medicine? Your condition will be monitored carefully while you are receiving this medicine. You will need important blood work done while you are taking this medicine. This drug may make you feel generally unwell. This is not uncommon, as chemotherapy can affect healthy cells as well as cancer cells. Report any side effects. Continue your course of treatment even though you feel ill unless your doctor tells you to stop. In some cases, you may be given additional medicines to help with side effects. Follow all directions for their use. Call your doctor or health care professional for advice if you get a fever, chills or sore throat, or other symptoms of a cold or flu. Do not treat yourself. This drug decreases your body's ability to fight infections. Try to avoid being around people who are sick. This medicine may increase your risk to bruise or bleed. Call your doctor or health care professional if you notice any unusual bleeding. Be careful brushing and flossing your teeth or using a toothpick because you may get an infection or bleed more easily. If you have any dental work done, tell your dentist you are receiving this medicine. Avoid taking products that contain aspirin, acetaminophen, ibuprofen, naproxen, or ketoprofen unless instructed by your doctor. These medicines may hide a fever. Do not become pregnant while taking this medicine. Women should inform their doctor if they wish to become pregnant or think they might be pregnant. There is a potential for serious side effects to an unborn child. Talk to your health care professional or  pharmacist for more information. Do not breast-feed an infant while taking this medicine. What side effects may I notice from receiving this medicine? Side effects  that you should report to your doctor or health care professional as soon as possible: -allergic reactions like skin rash, itching or hives, swelling of the face, lips, or tongue -signs of infection - fever or chills, cough, sore throat, pain or difficulty passing urine -signs of decreased platelets or bleeding - bruising, pinpoint red spots on the skin, black, tarry stools, nosebleeds -signs of decreased red blood cells - unusually weak or tired, fainting spells, lightheadedness -breathing problems -changes in hearing -changes in vision -chest pain -high blood pressure -low blood counts - This drug may decrease the number of white blood cells, red blood cells and platelets. You may be at increased risk for infections and bleeding. -nausea and vomiting -pain, swelling, redness or irritation at the injection site -pain, tingling, numbness in the hands or feet -problems with balance, talking, walking -trouble passing urine or change in the amount of urine Side effects that usually do not require medical attention (report to your doctor or health care professional if they continue or are bothersome): -hair loss -loss of appetite -metallic taste in the mouth or changes in taste This list may not describe all possible side effects. Call your doctor for medical advice about side effects. You may report side effects to FDA at 1-800-FDA-1088. Where should I keep my medicine? This drug is given in a hospital or clinic and will not be stored at home. NOTE: This sheet is a summary. It may not cover all possible information. If you have questions about this medicine, talk to your doctor, pharmacist, or health care provider.  2015, [employer] Standard. (2007-04-14 14:38:05) Docetaxel injection What is this medicine? DOCETAXEL (doe se TAX el) is a chemotherapy drug. It targets fast dividing cells, like cancer cells, and causes these cells to die. This medicine is used to treat many types of cancers  like breast cancer, certain stomach cancers, head and neck cancer, lung cancer, and prostate cancer. This medicine may be used for other purposes; ask your health care provider or pharmacist if you have questions. COMMON BRAND NAME(S): Docefrez, Taxotere What should I tell my health care provider before I take this medicine? They need to know if you have any of these conditions: -infection (especially a virus infection such as chickenpox, cold sores, or herpes) -liver disease -low blood counts, like low white cell, platelet, or red cell counts -an unusual or allergic reaction to docetaxel, polysorbate 80, other chemotherapy agents, other medicines, foods, dyes, or preservatives -pregnant or trying to get pregnant -breast-feeding How should I use this medicine? This drug is given as an infusion into a vein. It is administered in a hospital or clinic by a specially trained health care professional. Talk to your pediatrician regarding the use of this medicine in children. Special care may be needed. Overdosage: If you think you have taken too much of this medicine contact a poison control center or emergency room at once. NOTE: This medicine is only for you. Do not share this medicine with others. What if I miss a dose? It is important not to miss your dose. Call your doctor or health care professional if you are unable to keep an appointment. What may interact with this medicine? -cyclosporine -erythromycin -ketoconazole -medicines to increase blood counts like filgrastim, pegfilgrastim, sargramostim -vaccines Talk to your doctor or health care professional before taking any of these  medicines: -acetaminophen -aspirin -ibuprofen -ketoprofen -naproxen This list may not describe all possible interactions. Give your health care provider a list of all the medicines, herbs, non-prescription drugs, or dietary supplements you use. Also tell them if you smoke, drink alcohol, or use illegal drugs.  Some items may interact with your medicine. What should I watch for while using this medicine? Your condition will be monitored carefully while you are receiving this medicine. You will need important blood work done while you are taking this medicine. This drug may make you feel generally unwell. This is not uncommon, as chemotherapy can affect healthy cells as well as cancer cells. Report any side effects. Continue your course of treatment even though you feel ill unless your doctor tells you to stop. In some cases, you may be given additional medicines to help with side effects. Follow all directions for their use. Call your doctor or health care professional for advice if you get a fever, chills or sore throat, or other symptoms of a cold or flu. Do not treat yourself. This drug decreases your body's ability to fight infections. Try to avoid being around people who are sick. This medicine may increase your risk to bruise or bleed. Call your doctor or health care professional if you notice any unusual bleeding. Be careful brushing and flossing your teeth or using a toothpick because you may get an infection or bleed more easily. If you have any dental work done, tell your dentist you are receiving this medicine. Avoid taking products that contain aspirin, acetaminophen, ibuprofen, naproxen, or ketoprofen unless instructed by your doctor. These medicines may hide a fever. This medicine contains an alcohol in the product. You may get drowsy or dizzy. Do not drive, use machinery, or do anything that needs mental alertness until you know how this medicine affects you. Do not stand or sit up quickly, especially if you are an older patient. This reduces the risk of dizzy or fainting spells. Avoid alcoholic drinks Do not become pregnant while taking this medicine. Women should inform their doctor if they wish to become pregnant or think they might be pregnant. There is a potential for serious side effects to  an unborn child. Talk to your health care professional or pharmacist for more information. Do not breast-feed an infant while taking this medicine. What side effects may I notice from receiving this medicine? Side effects that you should report to your doctor or health care professional as soon as possible: -allergic reactions like skin rash, itching or hives, swelling of the face, lips, or tongue -low blood counts - This drug may decrease the number of white blood cells, red blood cells and platelets. You may be at increased risk for infections and bleeding. -signs of infection - fever or chills, cough, sore throat, pain or difficulty passing urine -signs of decreased platelets or bleeding - bruising, pinpoint red spots on the skin, black, tarry stools, nosebleeds -signs of decreased red blood cells - unusually weak or tired, fainting spells, lightheadedness -breathing problems -fast or irregular heartbeat -low blood pressure -mouth sores -nausea and vomiting -pain, swelling, redness or irritation at the injection site -pain, tingling, numbness in the hands or feet -swelling of the ankle, feet, hands -weight gain Side effects that usually do not require medical attention (report to your prescriber or health care professional if they continue or are bothersome): -bone pain -complete hair loss including hair on your head, underarms, pubic hair, eyebrows, and eyelashes -diarrhea -excessive tearing -changes in the  color of fingernails -loosening of the fingernails -nausea -muscle pain -red flush to skin -sweating -weak or tired This list may not describe all possible side effects. Call your doctor for medical advice about side effects. You may report side effects to FDA at 1-800-FDA-1088. Where should I keep my medicine? This drug is given in a hospital or clinic and will not be stored at home. NOTE: This sheet is a summary. It may not cover all possible information. If you have  questions about this medicine, talk to your doctor, pharmacist, or health care provider.  2015, [employer] Standard. (2012-12-03 22:21:02) Trastuzumab injection for infusion What is this medicine? TRASTUZUMAB (tras TOO zoo mab) is a monoclonal antibody. It targets a protein called HER2. This protein is found in some stomach and breast cancers. This medicine can stop cancer cell growth. This medicine may be used with other cancer treatments. This medicine may be used for other purposes; ask your health care provider or pharmacist if you have questions. COMMON BRAND NAME(S): Herceptin What should I tell my health care provider before I take this medicine? They need to know if you have any of these conditions: -heart disease -heart failure -infection (especially a virus infection such as chickenpox, cold sores, or herpes) -lung or breathing disease, like asthma -recent or ongoing radiation therapy -an unusual or allergic reaction to trastuzumab, benzyl alcohol, or other medications, foods, dyes, or preservatives -pregnant or trying to get pregnant -breast-feeding How should I use this medicine? This drug is given as an infusion into a vein. It is administered in a hospital or clinic by a specially trained health care professional. Talk to your pediatrician regarding the use of this medicine in children. This medicine is not approved for use in children. Overdosage: If you think you have taken too much of this medicine contact a poison control center or emergency room at once. NOTE: This medicine is only for you. Do not share this medicine with others. What if I miss a dose? It is important not to miss a dose. Call your doctor or health care professional if you are unable to keep an appointment. What may interact with this medicine? -cyclophosphamide -doxorubicin -warfarin This list may not describe all possible interactions. Give your health care provider a list of all the medicines,  herbs, non-prescription drugs, or dietary supplements you use. Also tell them if you smoke, drink alcohol, or use illegal drugs. Some items may interact with your medicine. What should I watch for while using this medicine? Visit your doctor for checks on your progress. Report any side effects. Continue your course of treatment even though you feel ill unless your doctor tells you to stop. Call your doctor or health care professional for advice if you get a fever, chills or sore throat, or other symptoms of a cold or flu. Do not treat yourself. Try to avoid being around people who are sick. You may experience fever, chills and shaking during your first infusion. These effects are usually mild and can be treated with other medicines. Report any side effects during the infusion to your health care professional. Fever and chills usually do not happen with later infusions. What side effects may I notice from receiving this medicine? Side effects that you should report to your doctor or other health care professional as soon as possible: -breathing difficulties -chest pain or palpitations -cough -dizziness or fainting -fever or chills, sore throat -skin rash, itching or hives -swelling of the legs or ankles -unusually weak  or tired Side effects that usually do not require medical attention (report to your doctor or other health care professional if they continue or are bothersome): -loss of appetite -headache -muscle aches -nausea This list may not describe all possible side effects. Call your doctor for medical advice about side effects. You may report side effects to FDA at 1-800-FDA-1088. Where should I keep my medicine? This drug is given in a hospital or clinic and will not be stored at home. NOTE: This sheet is a summary. It may not cover all possible information. If you have questions about this medicine, talk to your doctor, pharmacist, or health care provider.  2015, [employer]/Gold  Standard. (2008-11-11 13:43:15) Pertuzumab injection What is this medicine? PERTUZUMAB (per TOOZ ue mab) is a monoclonal antibody that targets a protein called HER2. HER2 is found in some breast cancers. This medicine can stop cancer cell growth. This medicine is used with other cancer treatments. This medicine may be used for other purposes; ask your health care provider or pharmacist if you have questions. COMMON BRAND NAME(S): PERJETA What should I tell my health care provider before I take this medicine? They need to know if you have any of these conditions: -heart disease -heart failure -high blood pressure -history of irregular heart beat -recent or ongoing radiation therapy -an unusual or allergic reaction to pertuzumab, other medicines, foods, dyes, or preservatives -pregnant or trying to get pregnant -breast-feeding How should I use this medicine? This medicine is for infusion into a vein. It is given by a health care professional in a hospital or clinic setting. Talk to your pediatrician regarding the use of this medicine in children. Special care may be needed. Overdosage: If you think you've taken too much of this medicine contact a poison control center or emergency room at once. Overdosage: If you think you have taken too much of this medicine contact a poison control center or emergency room at once. NOTE: This medicine is only for you. Do not share this medicine with others. What if I miss a dose? It is important not to miss your dose. Call your doctor or health care professional if you are unable to keep an appointment. What may interact with this medicine? Interactions are not expected. Give your health care provider a list of all the medicines, herbs, non-prescription drugs, or dietary supplements you use. Also tell them if you smoke, drink alcohol, or use illegal drugs. Some items may interact with your medicine. This list may not describe all possible interactions.  Give your health care provider a list of all the medicines, herbs, non-prescription drugs, or dietary supplements you use. Also tell them if you smoke, drink alcohol, or use illegal drugs. Some items may interact with your medicine. What should I watch for while using this medicine? Your condition will be monitored carefully while you are receiving this medicine. Report any side effects. Continue your course of treatment even though you feel ill unless your doctor tells you to stop. Do not become pregnant while taking this medicine. Women should inform their doctor if they wish to become pregnant or think they might be pregnant. There is a potential for serious side effects to an unborn child. Talk to your health care professional or pharmacist for more information. Do not breast-feed an infant while taking this medicine. Call your doctor or health care professional for advice if you get a fever, chills or sore throat, or other symptoms of a cold or flu. Do not treat yourself.  Try to avoid being around people who are sick. You may experience fever, chills, and headache during the infusion. Report any side effects during the infusion to your health care professional. What side effects may I notice from receiving this medicine? Side effects that you should report to your doctor or health care professional as soon as possible: -breathing problems -chest pain or palpitations -dizziness -feeling faint or lightheaded -fever or chills -skin rash, itching or hives -sore throat -swelling of the face, lips, or tongue -swelling of the legs or ankles -unusually weak or tired Side effects that usually do not require medical attention (Report these to your doctor or health care professional if they continue or are bothersome.): -diarrhea -hair loss -nausea, vomiting -tiredness This list may not describe all possible side effects. Call your doctor for medical advice about side effects. You may report side  effects to FDA at 1-800-FDA-1088. Where should I keep my medicine? This drug is given in a hospital or clinic and will not be stored at home. NOTE: This sheet is a summary. It may not cover all possible information. If you have questions about this medicine, talk to your doctor, pharmacist, or health care provider.  2015, [employer] Standard. (2011-11-06 16:54:15)

## 2014-08-09 ENCOUNTER — Telehealth: Payer: Self-pay | Admitting: *Deleted

## 2014-08-09 NOTE — Telephone Encounter (Signed)
This RN called pt on requested number and obtained an identified VM.  Message left regarding reason of call and return name and phone number to return call.

## 2014-08-10 ENCOUNTER — Ambulatory Visit: Payer: BLUE CROSS/BLUE SHIELD

## 2014-08-11 ENCOUNTER — Encounter: Payer: Self-pay | Admitting: *Deleted

## 2014-08-15 ENCOUNTER — Other Ambulatory Visit (HOSPITAL_BASED_OUTPATIENT_CLINIC_OR_DEPARTMENT_OTHER): Payer: BLUE CROSS/BLUE SHIELD

## 2014-08-15 ENCOUNTER — Ambulatory Visit (HOSPITAL_BASED_OUTPATIENT_CLINIC_OR_DEPARTMENT_OTHER): Payer: BLUE CROSS/BLUE SHIELD

## 2014-08-15 ENCOUNTER — Other Ambulatory Visit: Payer: Self-pay | Admitting: Nurse Practitioner

## 2014-08-15 ENCOUNTER — Ambulatory Visit: Payer: BLUE CROSS/BLUE SHIELD

## 2014-08-15 ENCOUNTER — Ambulatory Visit (HOSPITAL_BASED_OUTPATIENT_CLINIC_OR_DEPARTMENT_OTHER): Payer: BLUE CROSS/BLUE SHIELD | Admitting: Nurse Practitioner

## 2014-08-15 ENCOUNTER — Encounter: Payer: Self-pay | Admitting: Nurse Practitioner

## 2014-08-15 ENCOUNTER — Encounter: Payer: Self-pay | Admitting: *Deleted

## 2014-08-15 VITALS — BP 141/51 | HR 80 | Temp 98.4°F | Resp 18

## 2014-08-15 VITALS — BP 159/60 | HR 96 | Temp 98.2°F | Resp 18 | Ht 68.0 in | Wt 214.4 lb

## 2014-08-15 DIAGNOSIS — Z171 Estrogen receptor negative status [ER-]: Secondary | ICD-10-CM

## 2014-08-15 DIAGNOSIS — R197 Diarrhea, unspecified: Secondary | ICD-10-CM | POA: Insufficient documentation

## 2014-08-15 DIAGNOSIS — C50411 Malignant neoplasm of upper-outer quadrant of right female breast: Secondary | ICD-10-CM

## 2014-08-15 DIAGNOSIS — Z452 Encounter for adjustment and management of vascular access device: Secondary | ICD-10-CM | POA: Diagnosis not present

## 2014-08-15 DIAGNOSIS — C773 Secondary and unspecified malignant neoplasm of axilla and upper limb lymph nodes: Secondary | ICD-10-CM | POA: Diagnosis not present

## 2014-08-15 DIAGNOSIS — E860 Dehydration: Secondary | ICD-10-CM | POA: Insufficient documentation

## 2014-08-15 DIAGNOSIS — Z95828 Presence of other vascular implants and grafts: Secondary | ICD-10-CM

## 2014-08-15 LAB — CBC WITH DIFFERENTIAL/PLATELET
BASO%: 0.6 % (ref 0.0–2.0)
Basophils Absolute: 0.1 10*3/uL (ref 0.0–0.1)
EOS%: 0.4 % (ref 0.0–7.0)
Eosinophils Absolute: 0.1 10*3/uL (ref 0.0–0.5)
HCT: 33.4 % — ABNORMAL LOW (ref 34.8–46.6)
HGB: 11.4 g/dL — ABNORMAL LOW (ref 11.6–15.9)
LYMPH%: 20.9 % (ref 14.0–49.7)
MCH: 30.6 pg (ref 25.1–34.0)
MCHC: 34.1 g/dL (ref 31.5–36.0)
MCV: 89.5 fL (ref 79.5–101.0)
MONO#: 2 10*3/uL — ABNORMAL HIGH (ref 0.1–0.9)
MONO%: 18.1 % — ABNORMAL HIGH (ref 0.0–14.0)
NEUT#: 6.7 10*3/uL — ABNORMAL HIGH (ref 1.5–6.5)
NEUT%: 60 % (ref 38.4–76.8)
Platelets: 166 10*3/uL (ref 145–400)
RBC: 3.73 10*6/uL (ref 3.70–5.45)
RDW: 12.8 % (ref 11.2–14.5)
WBC: 11.1 10*3/uL — ABNORMAL HIGH (ref 3.9–10.3)
lymph#: 2.3 10*3/uL (ref 0.9–3.3)

## 2014-08-15 LAB — COMPREHENSIVE METABOLIC PANEL (CC13)
ALT: 28 U/L (ref 0–55)
AST: 35 U/L — ABNORMAL HIGH (ref 5–34)
Albumin: 3.6 g/dL (ref 3.5–5.0)
Alkaline Phosphatase: 90 U/L (ref 40–150)
Anion Gap: 9 mEq/L (ref 3–11)
BUN: 25 mg/dL (ref 7.0–26.0)
CO2: 25 mEq/L (ref 22–29)
Calcium: 9.4 mg/dL (ref 8.4–10.4)
Chloride: 107 mEq/L (ref 98–109)
Creatinine: 1.2 mg/dL — ABNORMAL HIGH (ref 0.6–1.1)
EGFR: 47 mL/min/{1.73_m2} — ABNORMAL LOW (ref >90)
Glucose: 230 mg/dl — ABNORMAL HIGH (ref 70–140)
Potassium: 4.4 mEq/L (ref 3.5–5.1)
Sodium: 140 mEq/L (ref 136–145)
Total Bilirubin: 0.67 mg/dL (ref 0.20–1.20)
Total Protein: 6.3 g/dL — ABNORMAL LOW (ref 6.4–8.3)

## 2014-08-15 MED ORDER — SODIUM CHLORIDE 0.9 % IV SOLN
Freq: Once | INTRAVENOUS | Status: AC
  Administered 2014-08-15: 14:00:00 via INTRAVENOUS

## 2014-08-15 MED ORDER — HEPARIN SOD (PORK) LOCK FLUSH 100 UNIT/ML IV SOLN
500.0000 [IU] | Freq: Once | INTRAVENOUS | Status: AC
  Administered 2014-08-15: 500 [IU] via INTRAVENOUS
  Filled 2014-08-15: qty 5

## 2014-08-15 MED ORDER — HEPARIN SOD (PORK) LOCK FLUSH 100 UNIT/ML IV SOLN
500.0000 [IU] | Freq: Once | INTRAVENOUS | Status: AC | PRN
  Administered 2014-08-15: 500 [IU]
  Filled 2014-08-15: qty 5

## 2014-08-15 MED ORDER — SODIUM CHLORIDE 0.9 % IJ SOLN
10.0000 mL | INTRAMUSCULAR | Status: DC | PRN
  Administered 2014-08-15: 10 mL via INTRAVENOUS
  Filled 2014-08-15: qty 10

## 2014-08-15 MED ORDER — ONDANSETRON HCL 40 MG/20ML IJ SOLN
Freq: Once | INTRAMUSCULAR | Status: AC
  Administered 2014-08-15: 15:00:00 via INTRAVENOUS
  Filled 2014-08-15: qty 4

## 2014-08-15 MED ORDER — SODIUM CHLORIDE 0.9 % IJ SOLN
10.0000 mL | INTRAMUSCULAR | Status: DC | PRN
  Administered 2014-08-15: 10 mL
  Filled 2014-08-15: qty 10

## 2014-08-15 NOTE — Progress Notes (Signed)
Darlene [name]  [phone] [phone]     ID: [name] DOB: 03/08/1950  MR#: [id number]  UDT#:[id number]  Patient Care Team: Thressa Sheller, MD as PCP - General (Internal Medicine) Bryson Ha Himmelrich, RD as Dietitian (Bariatrics) Stark Klein, MD as Consulting Physician (General Surgery) Chauncey Cruel, MD as Consulting Physician (Oncology) Arloa Koh, MD as Consulting Physician (Radiation Oncology) Mauro Kaufmann, RN as Registered Nurse Rockwell Germany, RN as Registered Nurse Jacelyn Pi, MD as Consulting Physician (Endocrinology) PCP: Thressa Sheller, MD GYN: Kem Boroughs FNP OTHER MD:  CHIEF COMPLAINT:  HER-2 positive , estrogen receptor negative breast cancer  CURRENT TREATMENT:  Neoadjuvant chemotherapy with anti-HER-2 treatment   BREAST CANCER HISTORY: From the original intake note:  "Darlene [name]" had screening mammography at her gynecologist suggestive of a possible abnormality in the right breast. However right diagnostic mammogram 04/12/2013 at [facility] was negative. More recently however, she noted a lump in her right breast and brought it to her gynecologist's attention. On 07/18/2014 the patient had bilateral diagnostic mammography with tomosynthesis at [facility]. The breast density was category B. In the right breast upper outer quadrant there was an area of focal asymmetry with indistinct margins.[name] Ultrasound was obtained and showed in addition to the mass in question, measuring 1.7 cm, an abnormal-appearing lymph node in the right axillary tail.  On 07/19/2014 the patient underwent biopsy of the right breast massin question as well as a right axillary lymph node. Both were positive for invasive ductal carcinoma, grade 2, estrogen and progesterone receptor negative, with an MIB-1 of 90%, and HER-2 pending. Incidentally the lymph node biopsy showed a lymphocytic inflammatory component but no lymph node architecture.  Her subsequent  history is as detailed below.  INTERVAL HISTORY:  Darlene [name] returns today for follow-up of her breast cancer, accompanied by her husband. Today is day 8, cycle 1 of carboplatin, docetaxel, trastuzumab and pertuzumab given every 3 weeks 6, with neulasta given on day 2.   REVIEW OF SYSTEMS: Darlene [name] denies fevers or chills. She stopped her nausea medicines perhaps a day too soon and vomited on Saturday, otherwise her nausea was reasonably managed. Her main complaint is diarrhea. She has had several loose stools daily since this weekend. She didn't know she could take imodium for this. Her appetite is down. She is probably dehydrated. She thinks she may have had one mouth sore, but it resovled on its own. She denies thrush. Her blood sugars have ranged from 500 on day 1 to 110s just yesterday. She is using the sliding scale she was given.  She had one headache. She had some mild bone/muscle aches from the neulasta. She experienced a few nose bleeds, but used saline nasal spray appropriately. A detailed review of systems is otherwise stable.  PAST MEDICAL HISTORY: Past Medical History  Diagnosis Date  . Obesity   . Hypertension   . Hyperlipidemia   . Obstructive sleep apnea on CPAP   . Diabetes mellitus without complication     97+ years  . Eating disorder     binge eating  . Balance problem   . Arthritis     in fingers, shoulders  . Anemia     hx of  . Depression   . Ovarian cyst rupture     possible  . Clumsiness   . Breast cancer of upper-outer quadrant of right female breast 07/22/2014  . Breast cancer   . Anxiety   . Neuromuscular disorder     PAST  SURGICAL HISTORY: Past Surgical History  Procedure Laterality Date  . Carpal tunnel release Right   . Carpal tunnel release Left   . Breath tek h pylori N/A 09/15/2012    Procedure: BREATH TEK H PYLORI;  Surgeon: Pedro Earls, MD;  Location: [facility];  Service: General;  Laterality: N/A;  . Dupuytren contracture release    .  Trigger finger release      x3  . Toe surgery  1970s    bone spur  . Rotator cuff repair Right 2005  . Gastric roux-en-y N/A 11/02/2012    Procedure: LAPAROSCOPIC ROUX-EN-Y GASTRIC;  Surgeon: Pedro Earls, MD;  Location: [facility];  Service: General;  Laterality: N/A;  . Bunionectomy Left 12/2013  . Portacath placement Left 08/02/2014    Procedure: INSERTION PORT-A-CATH;  Surgeon: Stark Klein, MD;  Location: [place];  Service: General;  Laterality: Left;    FAMILY HISTORY Family History  Problem Relation Age of Onset  . CVA Mother   . Diabetes Father   . Heart failure Father   . Hypertension Sister   . Diabetes Brother   . Diabetes Paternal Uncle    the patient's father died from complications of diabetes at age 24. The patient's mother died at age 2 with a subarachnoid hemorrhage. The patient had one brother, one sister. The only breast cancer was the patient's paternal grandmother diagnosed in her 85s. There is no history of ovarian cancer in the family.  GYNECOLOGIC HISTORY:  Patient's last menstrual period was 10/22/2007.  menarche age 75, the patient is GX P0. She went through the change of life in 2011. She did not take hormone replacement.  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 Her husband [name] is a retired Visual merchandiser (he taught at [employer]).  [name] has 2 children from an earlier marriage, Rockey Situ who teaches in [place], and Isatu Macinnes,  who works at the [employer] in in [place]. He has 1 grand son, aged 5. The patient and her husband are not church attender's    ADVANCED DIRECTIVES:  Not in place   HEALTH MAINTENANCE: History  Substance Use Topics  . Smoking status: Never Smoker   . Smokeless tobacco: Never Used  . Alcohol Use: 0.0 oz/week    0 Standard drinks or equivalent per week     Colonoscopy:  May 2016  PAP:  Bone density: 07/05/2013  normal, with a T score of 0.4  Lipid panel:  Allergies  Allergen Reactions  . Other Itching    Peanuts in large quantities     Current Outpatient Prescriptions  Medication Sig Dispense Refill  . acetaminophen (TYLENOL) 500 MG tablet Take 500 mg by mouth every 6 (six) hours as needed for pain.    . Calcium Citrate-Vitamin D (CALCIUM CITRATE+D3 PO) Take 1,500 mg by mouth daily.    [name] dexamethasone (DECADRON) 4 MG tablet Take 2 tablets twice daily with food the day BEFORE chemo. 30 tablet 1  . glucose blood (ONETOUCH VERIO) test strip 1 each by Other route as needed for other. Use as instructed    . lidocaine-prilocaine (EMLA) cream Apply to affected area once 30 g 3  . lidocaine-prilocaine (EMLA) cream Apply to affected area once 30 g 3  . LORazepam (ATIVAN) 0.5 MG tablet Take 1 tablet (0.5 mg total) by mouth at bedtime. 30 tablet 0  . losartan (COZAAR) 100 MG tablet Take  1 tablet by mouth daily.     . Multiple Vitamin (MULTIVITAMIN WITH MINERALS) TABS tablet Take 1 tablet by mouth daily. With iron    . NOVOLIN 70/30 (70-30) 100 UNIT/ML injection 10 units two times a day    . ondansetron (ZOFRAN) 8 MG tablet Take 1 tablet (8 mg total) by mouth 2 (two) times daily. Start the day after chemo for 3 days. Then take as needed for nausea or vomiting. 30 tablet 1  . Probiotic Product ([employer] PROBIOTIC PO) Take 1 capsule by mouth daily.    . prochlorperazine (COMPAZINE) 10 MG tablet Take one table before meals and at bedtime (4 times a day) starint the evening of chemo and continuing for 3 full days 30 tablet 1  . tobramycin-dexamethasone (TOBRADEX) ophthalmic solution Place 1 drop into both eyes 2 (two) times daily. 5 mL 1  . Vortioxetine HBr (BRINTELLIX) 10 MG TABS Take 10 mg by mouth every morning.     [name] aspirin 81 MG tablet Take 81 mg by mouth daily.      [name] Sodium (COLACE PO) Take 1 capsule by mouth 2 (two) times daily.     . mometasone (NASONEX) 50 MCG/ACT nasal spray Place 2 sprays into  the nose daily as needed.       No current facility-administered medications for this visit.   Facility-Administered Medications Ordered in Other Visits  Medication Dose Route Frequency Provider Last Rate Last Dose  . heparin lock flush 100 unit/mL  500 Units Intracatheter Once PRN Laurie Panda, NP      . ondansetron (ZOFRAN) 8 mg in sodium chloride 0.9 % 50 mL IVPB   Intravenous Once Heather F Boelter, NP      . sodium chloride 0.9 % injection 10 mL  10 mL Intracatheter PRN Laurie Panda, NP        OBJECTIVE:  Middle-aged white woman in no acute distress Filed Vitals:   08/15/14 1319  BP: 159/60  Pulse: 96  Temp: 98.2 F (36.8 C)  Resp: 18     Body mass index is 32.6 kg/(m^2).    ECOG FS:1 - Symptomatic but completely ambulatory  Skin: warm, dry  HEENT: sclerae anicteric, conjunctivae pink, oropharynx clear. No thrush or mucositis.  Lymph Nodes: No cervical or supraclavicular lymphadenopathy  Lungs: clear to auscultation bilaterally, no rales, wheezes, or rhonci  Heart: regular rate and rhythm  Abdomen: round, soft, non tender, positive bowel sounds  Musculoskeletal: No focal spinal tenderness, no peripheral edema  Neuro: non focal, well oriented, positive affect  Breasts: deferred  LAB RESULTS:  CMP     Component Value Date/Time   NA 140 08/15/2014 1235   NA 140 02/18/2013 0827   K 4.4 08/15/2014 1235   K 4.3 02/18/2013 0827   CL 104 02/18/2013 0827   CO2 25 08/15/2014 1235   CO2 25 02/18/2013 0827   GLUCOSE 230* 08/15/2014 1235   GLUCOSE 168* 02/18/2013 0827   BUN 25.0 08/15/2014 1235   BUN 26* 02/18/2013 0827   CREATININE 1.2* 08/15/2014 1235   CREATININE 1.10 02/18/2013 0827   CREATININE 1.37* 11/02/2012 1230   CALCIUM 9.4 08/15/2014 1235   CALCIUM 9.5 02/18/2013 0827   PROT 6.3* 08/15/2014 1235   PROT 6.3 02/18/2013 0827   ALBUMIN 3.6 08/15/2014 1235   ALBUMIN 3.9 02/18/2013 0827   AST 35* 08/15/2014 1235   AST 23 02/18/2013 0827   ALT 28  08/15/2014 1235   ALT 36* 02/18/2013 0827  ALKPHOS 90 08/15/2014 1235   ALKPHOS 68 02/18/2013 0827   BILITOT 0.67 08/15/2014 1235   BILITOT 0.9 02/18/2013 0827   GFRNONAA 55* 02/18/2013 0827   GFRNONAA 41* 11/02/2012 1230   GFRAA 63 02/18/2013 0827   GFRAA 48* 11/02/2012 1230    INo results found for: SPEP, UPEP  Lab Results  Component Value Date   WBC 11.1* 08/15/2014   NEUTROABS 6.7* 08/15/2014   HGB 11.4* 08/15/2014   HCT 33.4* 08/15/2014   MCV 89.5 08/15/2014   PLT 166 08/15/2014      Chemistry      Component Value Date/Time   NA 140 08/15/2014 1235   NA 140 02/18/2013 0827   K 4.4 08/15/2014 1235   K 4.3 02/18/2013 0827   CL 104 02/18/2013 0827   CO2 25 08/15/2014 1235   CO2 25 02/18/2013 0827   BUN 25.0 08/15/2014 1235   BUN 26* 02/18/2013 0827   CREATININE 1.2* 08/15/2014 1235   CREATININE 1.10 02/18/2013 0827   CREATININE 1.37* 11/02/2012 1230      Component Value Date/Time   CALCIUM 9.4 08/15/2014 1235   CALCIUM 9.5 02/18/2013 0827   ALKPHOS 90 08/15/2014 1235   ALKPHOS 68 02/18/2013 0827   AST 35* 08/15/2014 1235   AST 23 02/18/2013 0827   ALT 28 08/15/2014 1235   ALT 36* 02/18/2013 0827   BILITOT 0.67 08/15/2014 1235   BILITOT 0.9 02/18/2013 0827       No results found for: LABCA2  No components found for: ESPQZ300  No results for input(s): INR in the last 168 hours.  Urinalysis    Component Value Date/Time   BILIRUBINUR neg 06/29/2013 1017   PROTEINUR trace 06/29/2013 1017   UROBILINOGEN negative 06/29/2013 1017   NITRITE neg 06/29/2013 1017   LEUKOCYTESUR Negative 06/29/2013 1017    STUDIES: Mr Breast Bilateral W Wo Contrast  08/01/2014   CLINICAL DATA:  Recently diagnosed right breast cancer, with malignant right axillary lymphadenopathy.  LABS:  None.  EXAM: BILATERAL BREAST MRI WITH AND WITHOUT CONTRAST  TECHNIQUE: Multiplanar, multisequence MR images of both breasts were obtained prior to and following the intravenous  administration of 10 ml of MultiHance.  THREE-DIMENSIONAL MR IMAGE RENDERING ON INDEPENDENT WORKSTATION:  Three-dimensional MR images were rendered by post-processing of the original MR data on an independent workstation. The three-dimensional MR images were interpreted, and findings are reported in the following complete MRI report for this study. Three dimensional images were evaluated at the independent DynaCad workstation  COMPARISON:  Mammogram and ultrasound dated 07/19/2014 and 07/18/2014, and previous mammograms.  FINDINGS: Breast composition: b. Scattered fibroglandular tissue.  Background parenchymal enhancement: Mild.  Right breast: There is a spiculated mass in the right lateral central breast, middle depth, which measures 3.7 by 2.0 by 1.8 cm, with a mixed pattern of enhancement, including rapid wash-in and washout. It contains a post biopsy metallic marker. This mass is a part of a larger non mass clumped area of enhancement, which involves large portion of the lateral central breast measuring 6.9 by 5.8 by 6.2 cm. Additionally, numerous few mm satellite nodules extend anteriorly, inferiorly and superiorly to this more confluent non mass regional area of enhancement. No suspicious nipple or skin enhancement is noted.  Left breast: No mass or abnormal enhancement.  Lymph nodes: There are at least 2 lymph nodes in the right axilla with abnormal appearance demonstrating cortical thickening and round shape. The larger of the two measures 1.8 cm in long-axis.  Ancillary  findings:  None.  IMPRESSION: Large right lateral breast area of non mass enhancement, containing a spiculated mass within its more posterior portion, with surrounding numerous satellite lesions, consistent with multicentric right breast malignancy.  Probably malignant right axillary lymphadenopathy.  No MRI evidence of malignancy in the left breast.  RECOMMENDATION: Medical and surgical treatment of this malignancy, per treatment plan.   BI-RADS CATEGORY  6: Known biopsy-proven malignancy.   Electronically Signed   By: Fidela Salisbury M.D.   On: 08/01/2014 15:18   Dg Chest Port 1 View  08/02/2014   CLINICAL DATA:  Postoperative radiograph. Port-A-Cath placement. Evaluate tip position.  EXAM: PORTABLE CHEST - 1 VIEW  COMPARISON:  09/16/2012.  FINDINGS: New LEFT subclavian Port-A-Cath. The tip is in the lower superior vena cava, slightly less than 2 vertebral bodies inferior to the carina. Lung volumes are lower than on prior exam.  No pneumothorax.  Aortic arch atherosclerosis.  IMPRESSION: Uncomplicated new LEFT subclavian Port-A-Cath placement with the tip just cranial to the junction of the superior vena cava and RIGHT atrium.   Electronically Signed   By: Dereck Ligas M.D.   On: 08/02/2014 11:31   Dg Fluoro Guide Cv Line-no Report  08/02/2014   CLINICAL DATA:    FLOURO GUIDE CV LINE  Fluoroscopy was utilized by the requesting physician.  No radiographic  interpretation.     ASSESSMENT: 63 y.o. [place] woman status post right breast and right axillary lymph node biopsy 07/19/2014 for a cT2  pN1, stage IIB  invasive ductal carcinoma, grade 2, estrogen and progesterone receptor negative, with an MIB-1 of 90%, and HER-2 positive  (1) neoadjuvant chemotherapy will consist of carboplatin, docetaxel, trastuzumab and pertuzumab given every 3 weeks 6, with onpro support, first dose 08/08/2014  (2) trastuzumab to be continued to complete one year  (a) echo 07/29/2014 shows a normal ejection fraction  (3) Definitive surgery to follow chemotherapy, with consideration of the Alliance trial  (4) adjuvant radiation to follow surgery   PLAN: All things considered, Darlene [name] managed her first cycle of chemo reasonably well. The labs were reviewed in detail and were stable. She will consider keeping a log of blood sugars to get a good pattern on paper, but the sliding scale was helpful.   I encouraged her to pick up some imodium for  her diarrhea, and begin it several times daily immediately. In the meantime, I am sending her for IV rehydration this afternoon. She has a history of a gastric bypass and gets dehydrated easily.   Darlene [name] will return in 2 weeks for cycle 2 of treatment. She understands and agrees with this plan. She knows the goal of treatment in her case is cure. She has been encouraged to call with any issues that might arise before her next visit here.  Laurie Panda, NP   08/15/2014 3:02 PM

## 2014-08-15 NOTE — Patient Instructions (Signed)
Dehydration, Adult Dehydration is when you lose more fluids from the body than you take in. Vital organs like the kidneys, brain, and heart cannot function without a proper amount of fluids and salt. Any loss of fluids from the body can cause dehydration.  CAUSES   Vomiting.  Diarrhea.  Excessive sweating.  Excessive urine output.  Fever. SYMPTOMS  Mild dehydration  Thirst.  Dry lips.  Slightly dry mouth. Moderate dehydration  Very dry mouth.  Sunken eyes.  Skin does not bounce back quickly when lightly pinched and released.  Dark urine and decreased urine production.  Decreased tear production.  Headache. Severe dehydration  Very dry mouth.  Extreme thirst.  Rapid, weak pulse (more than 100 beats per minute at rest).  Cold hands and feet.  Not able to sweat in spite of heat and temperature.  Rapid breathing.  Blue lips.  Confusion and lethargy.  Difficulty being awakened.  Minimal urine production.  No tears. DIAGNOSIS  Your caregiver will diagnose dehydration based on your symptoms and your exam. Blood and urine tests will help confirm the diagnosis. The diagnostic evaluation should also identify the cause of dehydration. TREATMENT  Treatment of mild or moderate dehydration can often be done at home by increasing the amount of fluids that you drink. It is best to drink small amounts of fluid more often. Drinking too much at one time can make vomiting worse. Refer to the home care instructions below. Severe dehydration needs to be treated at the hospital where you will probably be given intravenous (IV) fluids that contain water and electrolytes. HOME CARE INSTRUCTIONS   Ask your caregiver about specific rehydration instructions.  Drink enough fluids to keep your urine clear or pale yellow.  Drink small amounts frequently if you have nausea and vomiting.  Eat as you normally do.  Avoid:  Foods or drinks high in sugar.  Carbonated  drinks.  Juice.  Extremely hot or cold fluids.  Drinks with caffeine.  Fatty, greasy foods.  Alcohol.  Tobacco.  Overeating.  Gelatin desserts.  Wash your hands well to avoid spreading bacteria and viruses.  Only take over-the-counter or prescription medicines for pain, discomfort, or fever as directed by your caregiver.  Ask your caregiver if you should continue all prescribed and over-the-counter medicines.  Keep all follow-up appointments with your caregiver. SEEK MEDICAL CARE IF:  You have abdominal pain and it increases or stays in one area (localizes).  You have a rash, stiff neck, or severe headache.  You are irritable, sleepy, or difficult to awaken.  You are weak, dizzy, or extremely thirsty. SEEK IMMEDIATE MEDICAL CARE IF:   You are unable to keep fluids down or you get worse despite treatment.  You have frequent episodes of vomiting or diarrhea.  You have blood or green matter (bile) in your vomit.  You have blood in your stool or your stool looks black and tarry.  You have not urinated in 6 to 8 hours, or you have only urinated a small amount of very dark urine.  You have a fever.  You faint. MAKE SURE YOU:   Understand these instructions.  Will watch your condition.  Will get help right away if you are not doing well or get worse. Document Released: 01/07/2005 Document Revised: 04/01/2011 Document Reviewed: 08/27/2010 ExitCare Patient Information 2015 [employer]. This information is not intended to replace advice given to you by your health care provider. Make sure you discuss any questions you have with your health care   provider.  

## 2014-08-15 NOTE — Patient Instructions (Signed)

## 2014-08-15 NOTE — Progress Notes (Signed)
Met with pt prior to and after nadir check with Heather. Pt not feeling well, relate she has had diarrhea since Friday night and has little to eat or drink d/t n/v. Was able to get the pt to eat a small sandwich and drink some water. Discussed symptoms with Heather. After appt walked pt to infusion for IVF. Pt knows to call with further questions or concerns.

## 2014-08-16 ENCOUNTER — Telehealth: Payer: Self-pay | Admitting: Nurse Practitioner

## 2014-08-16 NOTE — Telephone Encounter (Signed)
Left message to confirm appointment canceled on 08/01 per pof

## 2014-08-17 ENCOUNTER — Telehealth: Payer: Self-pay | Admitting: *Deleted

## 2014-08-17 ENCOUNTER — Telehealth: Payer: Self-pay | Admitting: Neurology

## 2014-08-17 NOTE — Telephone Encounter (Signed)
Pt called and has been diagnosed with breast cancer and with her chemo she is having nose bleeds. She wears a cpap and is wondering if that could be making it worse for her. Please call and advise [phone]

## 2014-08-17 NOTE — Telephone Encounter (Signed)
Message from pt reporting persistent diarrhea since 7/22. She reports she has been taking Imodium. Had 2 loose stools on 7/26 and one loose stool today. Pt did not take Imodium today. Instructed her to take a dose now, reviewed instructions for chemo induced diarrhea. (Take one after every loose stool up to 8 per day.) Pt reports she is tolerating POs, though she has only had "50 oz today". Denies nausea but feels "full or bloated." Reviewed with Nira Conn, NP: Pt to continue Imodium PRN, push fluids. Abdominal discomfort likely related to history of gastric surgery.

## 2014-08-17 NOTE — Telephone Encounter (Signed)
Spoke to Dr. Brett Fairy regarding pt's concerns. She said that the cpap could be contributing to the pt's nosebleeds. She recommended that the pt try a cpap gel or cream, and it can be found over the counter at any pharmacy. Pt was grateful for the advice and verbalized understanding to call us back with further questions or concerns.

## 2014-08-22 ENCOUNTER — Other Ambulatory Visit: Payer: BLUE CROSS/BLUE SHIELD

## 2014-08-22 ENCOUNTER — Ambulatory Visit: Payer: BLUE CROSS/BLUE SHIELD | Admitting: Nurse Practitioner

## 2014-08-22 ENCOUNTER — Encounter: Payer: Self-pay | Admitting: Nurse Practitioner

## 2014-08-22 NOTE — Progress Notes (Signed)
I faxed forms to [employer]  [phone]

## 2014-08-23 ENCOUNTER — Encounter (HOSPITAL_COMMUNITY): Payer: Self-pay

## 2014-08-23 ENCOUNTER — Ambulatory Visit (HOSPITAL_COMMUNITY)
Admission: RE | Admit: 2014-08-23 | Discharge: 2014-08-23 | Disposition: A | Discharge status: Home / Self Care | Payer: BLUE CROSS/BLUE SHIELD | Source: Ambulatory Visit | Attending: Cardiology | Admitting: Cardiology

## 2014-08-23 VITALS — BP 134/52 | HR 63 | Ht 69.0 in | Wt 215.4 lb

## 2014-08-23 DIAGNOSIS — I10 Essential (primary) hypertension: Secondary | ICD-10-CM | POA: Insufficient documentation

## 2014-08-23 DIAGNOSIS — E785 Hyperlipidemia, unspecified: Secondary | ICD-10-CM | POA: Insufficient documentation

## 2014-08-23 DIAGNOSIS — Z7982 Long term (current) use of aspirin: Secondary | ICD-10-CM | POA: Diagnosis not present

## 2014-08-23 DIAGNOSIS — Z9884 Bariatric surgery status: Secondary | ICD-10-CM | POA: Insufficient documentation

## 2014-08-23 DIAGNOSIS — E119 Type 2 diabetes mellitus without complications: Secondary | ICD-10-CM | POA: Diagnosis not present

## 2014-08-23 DIAGNOSIS — G4733 Obstructive sleep apnea (adult) (pediatric): Secondary | ICD-10-CM | POA: Insufficient documentation

## 2014-08-23 DIAGNOSIS — Z171 Estrogen receptor negative status [ER-]: Secondary | ICD-10-CM | POA: Insufficient documentation

## 2014-08-23 DIAGNOSIS — Z833 Family history of diabetes mellitus: Secondary | ICD-10-CM | POA: Insufficient documentation

## 2014-08-23 DIAGNOSIS — Z79899 Other long term (current) drug therapy: Secondary | ICD-10-CM | POA: Diagnosis not present

## 2014-08-23 DIAGNOSIS — C50411 Malignant neoplasm of upper-outer quadrant of right female breast: Secondary | ICD-10-CM | POA: Diagnosis not present

## 2014-08-23 DIAGNOSIS — Z8249 Family history of ischemic heart disease and other diseases of the circulatory system: Secondary | ICD-10-CM | POA: Insufficient documentation

## 2014-08-23 NOTE — Patient Instructions (Signed)
Follow up in 3 months with an ECHO 

## 2014-08-23 NOTE — Progress Notes (Signed)
Patient ID: Darlene [name], female   DOB: May 15, 1950, 64 y.o.   MRN: [id number] Referring Physician:Dr [place]  Patient Care Team: Thressa Sheller, MD as PCP - General (Internal Medicine) Bryson Ha Himmelrich, RD as Dietitian (Bariatrics) Stark Klein, MD as Consulting Physician (General Surgery) Chauncey Cruel, MD as Consulting Physician (Oncology) Arloa Koh, MD as Consulting Physician (Radiation Oncology) Mauro Kaufmann, RN as Registered Nurse Rockwell Germany, RN as Registered Nurse Jacelyn Pi, MD as Consulting Physician (Endocrinology) PCP: Thressa Sheller, MD GYN: Kem Boroughs FNP   HPI: Darlene [name] is a 64 year old with history of R breast cancer, right axillary lymph node biopsy 07/19/2014 for a cT2 pN1, stage IIB invasive ductal carcinoma, grade 2, estrogen and progesterone receptor negative, with an MIB-1 of 90%, and HER-2 positive , OSA -> CPAP, bariatric surgery, and DM, referred to cardio-onc clinic by Dr Jana Hakim  Started neoadjuvant chemotherapy will consist of carboplatin, docetaxel, trastuzumab and pertuzumab given every 3 weeks 6 (2) trastuzumab to be continued to complete one year (3) Definitive surgery to follow chemotherapy, with consideration of the Alliance trial (4) adjuvant radiation to follow surgery  Today she denies SOB/CP. Poor appetite. Not exercising currently.   Echo (7/16) with EF 60-65%, grade II diastolic dysfunction.   Review of Systems: [y] = yes, _0  = no   General: Weight gain _1 ; Weight loss _2 ; Anorexia _3 ; Fatigue _4 ; Fever _5 ; Chills _6 ; Weakness _7   Cardiac: Chest pain/pressure _8 ; Resting SOB _9 ; Exertional SOB _10 ; Orthopnea _11 ; Pedal Edema _12 ; Palpitations _13 ; Syncope _14 ; Presyncope _15 ; Paroxysmal nocturnal dyspnea_16   Pulmonary: Cough _17 ; Wheezing_18 ; Hemoptysis_19 ; Sputum _20 ; Snoring _21   GI: Vomiting_22 ; Dysphagia_23 ; Melena_24 ; Hematochezia _25 ; Heartburn_26 ; Abdominal pain _27 ; Constipation _28 ;  Diarrhea [Y ]; BRBPR _29   GU: Hematuria_30 ; Dysuria _31 ; Nocturia_32   Vascular: Pain in legs with walking _33 ; Pain in feet with lying flat _34 ; Non-healing sores _35 ; Stroke _36 ; TIA _37 ; Slurred speech _38 ;  Neuro: Headaches_39 ; Vertigo_40 ; Seizures_41 ; Paresthesias_42 ;Blurred vision _43 ; Diplopia _44 ; Vision changes _45   Ortho/Skin: Arthritis _46 ; Joint pain _47 ; Muscle pain _48 ; Joint swelling _49 ; Back Pain _50 ; Rash _51   Psych: Depression_52 ; Anxiety_53   Heme: Bleeding problems _54 ; Clotting disorders _55 ; Anemia _56   Endocrine: Diabetes [ Y]; Thyroid dysfunction_57    Past Medical History  Diagnosis Date  . Obesity   . Hypertension   . Hyperlipidemia   . Obstructive sleep apnea on CPAP   . Diabetes mellitus without complication     09+ years  . Eating disorder     binge eating  . Balance problem   . Arthritis     in fingers, shoulders  . Anemia     hx of  . Depression   . Ovarian cyst rupture     possible  . Clumsiness   . Breast cancer of upper-outer quadrant of right female breast 07/22/2014  . Breast cancer   . Anxiety   . Neuromuscular disorder     Current Outpatient Prescriptions  Medication Sig Dispense Refill  . acetaminophen (TYLENOL) 500 MG tablet Take 500 mg by mouth every 6 (six) hours as needed for pain.    . Calcium Citrate-Vitamin D (CALCIUM CITRATE+D3  PO) Take 1,500 mg by mouth daily.    [name] dexamethasone (DECADRON) 4 MG tablet Take 2 tablets twice daily with food the day BEFORE chemo. 30 tablet 1  . glucose blood (ONETOUCH VERIO) test strip 1 each by Other route as needed for other. Use as instructed    . lidocaine-prilocaine (EMLA) cream Apply to affected area once 30 g 3  . LORazepam (ATIVAN) 0.5 MG tablet Take 1 tablet (0.5 mg total) by mouth at bedtime. 30 tablet 0  . losartan (COZAAR) 100 MG tablet Take 1 tablet by mouth daily.     . mometasone (NASONEX) 50 MCG/ACT nasal spray Place 2 sprays into the nose daily as needed.      . Multiple Vitamin  (MULTIVITAMIN WITH MINERALS) TABS tablet Take 1 tablet by mouth daily. With iron    . NOVOLIN 70/30 (70-30) 100 UNIT/ML injection 10 units two times a day    . ondansetron (ZOFRAN) 8 MG tablet Take 1 tablet (8 mg total) by mouth 2 (two) times daily. Start the day after chemo for 3 days. Then take as needed for nausea or vomiting. 30 tablet 1  . Probiotic Product ([employer] PROBIOTIC PO) Take 1 capsule by mouth daily.    . prochlorperazine (COMPAZINE) 10 MG tablet Take one table before meals and at bedtime (4 times a day) starint the evening of chemo and continuing for 3 full days 30 tablet 1  . tobramycin-dexamethasone (TOBRADEX) ophthalmic solution Place 1 drop into both eyes 2 (two) times daily. 5 mL 1  . Vortioxetine HBr (BRINTELLIX) 10 MG TABS Take 10 mg by mouth every morning.     [name] aspirin 81 MG tablet Take 81 mg by mouth daily.      [name] Sodium (COLACE PO) Take 1 capsule by mouth 2 (two) times daily.      No current facility-administered medications for this encounter.    Allergies  Allergen Reactions  . Other Itching    Peanuts in large quantities       History   Social History  . Marital Status: Married    Spouse Name: [name]  . Number of Children: 0  . Years of Education: Bachelor's   Occupational History  . Theatre stage manager    Social History Main Topics  . Smoking status: Never Smoker   . Smokeless tobacco: Never Used  . Alcohol Use: 0.0 oz/week    0 Standard drinks or equivalent per week  . Drug Use: No  . Sexual Activity: Yes    Birth Control/ Protection: Post-menopausal   Other Topics Concern  . Not on file   Social History Narrative   Patient is married [name]) and lives at home with her husband.   Patient does not have any children.   Patient is working for a [employer].   Patient has a Bachelor's degree.   Patient is right-handed.   Patient does not drink any caffeine.      Family History  Problem Relation Age of Onset  . CVA Mother    . Diabetes Father   . Heart failure Father   . Hypertension Sister   . Diabetes Brother   . Diabetes Paternal Uncle     Filed Vitals:   08/23/14 1157  BP: 134/52  Pulse: 63  Height: _0  (1.753 m)  Weight: 215 lb 6.4 oz (97.705 kg)  SpO2: 98%    PHYSICAL EXAM: General:  Well appearing. No respiratory difficulty HEENT: normal Neck: supple. no JVD. Carotids 2+  bilat; no bruits. No lymphadenopathy or thryomegaly appreciated. Cor: PMI nondisplaced. Regular rate & rhythm. No rubs, gallops or murmurs. Lungs: clear Abdomen: soft, nontender, nondistended. No hepatosplenomegaly. No bruits or masses. Good bowel sounds. Extremities: no cyanosis, clubbing, rash, edema Neuro: alert & oriented x 3, cranial nerves grossly intact. moves all 4 extremities w/o difficulty. Affect pleasant.  ASSESSMENT & PLAN:  1. R Breast Cancer- cT2 pN1, stage IIB invasive ductal carcinoma, grade 2, estrogen and progesterone receptor negative, with an MIB-1 of 90%, and HER-2 positive. Plans to continue trastuzumab for 1 year.  Dr Aundra Dubin discussed and reviewed ECHO. This showed preserved LV systolic function, EF 64-38%.  - Plan to repeat echo in 3 months with office visit. 2. HTN- Continue current regimen.  3. OSA- Continue nightly CPAP  4. H/O Gastric Bypass   Amy Clegg, NP-C 08/23/14  Patient seen with NP, agree with the above note.  She is currently getting neoadjuvant chemo for 6 cycles then will continue Herceptin x 1 year.  We discussed the risk for cardio-toxicity from Herceptin use and the rationale behind screening with echocardiograms.  I reviewed her 7/16 echo which looked ok.  She will have repeat echo + office visit in 10/16.   Loralie Champagne 08/24/2014

## 2014-08-26 ENCOUNTER — Other Ambulatory Visit (HOSPITAL_BASED_OUTPATIENT_CLINIC_OR_DEPARTMENT_OTHER): Payer: BLUE CROSS/BLUE SHIELD

## 2014-08-26 ENCOUNTER — Ambulatory Visit (HOSPITAL_BASED_OUTPATIENT_CLINIC_OR_DEPARTMENT_OTHER): Payer: BLUE CROSS/BLUE SHIELD | Admitting: Nurse Practitioner

## 2014-08-26 ENCOUNTER — Encounter: Payer: Self-pay | Admitting: *Deleted

## 2014-08-26 ENCOUNTER — Other Ambulatory Visit: Payer: Self-pay | Admitting: *Deleted

## 2014-08-26 ENCOUNTER — Encounter: Payer: Self-pay | Admitting: Nurse Practitioner

## 2014-08-26 VITALS — BP 142/68 | HR 54 | Temp 97.9°F | Resp 18 | Ht 69.0 in | Wt 215.9 lb

## 2014-08-26 DIAGNOSIS — R040 Epistaxis: Secondary | ICD-10-CM | POA: Insufficient documentation

## 2014-08-26 DIAGNOSIS — C50411 Malignant neoplasm of upper-outer quadrant of right female breast: Secondary | ICD-10-CM

## 2014-08-26 DIAGNOSIS — F32A Depression, unspecified: Secondary | ICD-10-CM

## 2014-08-26 DIAGNOSIS — F329 Major depressive disorder, single episode, unspecified: Secondary | ICD-10-CM

## 2014-08-26 DIAGNOSIS — Z171 Estrogen receptor negative status [ER-]: Secondary | ICD-10-CM | POA: Diagnosis not present

## 2014-08-26 DIAGNOSIS — C773 Secondary and unspecified malignant neoplasm of axilla and upper limb lymph nodes: Secondary | ICD-10-CM | POA: Diagnosis not present

## 2014-08-26 HISTORY — DX: Epistaxis: R04.0

## 2014-08-26 LAB — COMPREHENSIVE METABOLIC PANEL (CC13)
ALT: 25 U/L (ref 0–55)
AST: 19 U/L (ref 5–34)
Albumin: 3.6 g/dL (ref 3.5–5.0)
Alkaline Phosphatase: 83 U/L (ref 40–150)
Anion Gap: 5 mEq/L (ref 3–11)
BUN: 28.3 mg/dL — ABNORMAL HIGH (ref 7.0–26.0)
CO2: 28 mEq/L (ref 22–29)
Calcium: 9.2 mg/dL (ref 8.4–10.4)
Chloride: 109 mEq/L (ref 98–109)
Creatinine: 1.1 mg/dL (ref 0.6–1.1)
EGFR: 56 mL/min/{1.73_m2} — ABNORMAL LOW (ref >90)
Glucose: 176 mg/dl — ABNORMAL HIGH (ref 70–140)
Potassium: 4.6 mEq/L (ref 3.5–5.1)
Sodium: 142 mEq/L (ref 136–145)
Total Bilirubin: 0.56 mg/dL (ref 0.20–1.20)
Total Protein: 6.2 g/dL — ABNORMAL LOW (ref 6.4–8.3)

## 2014-08-26 LAB — CBC WITH DIFFERENTIAL/PLATELET
BASO%: 0.7 % (ref 0.0–2.0)
Basophils Absolute: 0.1 10*3/uL (ref 0.0–0.1)
EOS%: 5.4 % (ref 0.0–7.0)
Eosinophils Absolute: 0.7 10*3/uL — ABNORMAL HIGH (ref 0.0–0.5)
HCT: 33.7 % — ABNORMAL LOW (ref 34.8–46.6)
HGB: 11.5 g/dL — ABNORMAL LOW (ref 11.6–15.9)
LYMPH%: 21.9 % (ref 14.0–49.7)
MCH: 30.8 pg (ref 25.1–34.0)
MCHC: 34 g/dL (ref 31.5–36.0)
MCV: 90.7 fL (ref 79.5–101.0)
MONO#: 0.7 10*3/uL (ref 0.1–0.9)
MONO%: 6 % (ref 0.0–14.0)
NEUT#: 8.2 10*3/uL — ABNORMAL HIGH (ref 1.5–6.5)
NEUT%: 66 % (ref 38.4–76.8)
Platelets: 269 10*3/uL (ref 145–400)
RBC: 3.72 10*6/uL (ref 3.70–5.45)
RDW: 13.4 % (ref 11.2–14.5)
WBC: 12.5 10*3/uL — ABNORMAL HIGH (ref 3.9–10.3)
lymph#: 2.7 10*3/uL (ref 0.9–3.3)

## 2014-08-26 NOTE — Progress Notes (Signed)
[facility]  [phone] [phone]) [phone]     ID: Darlene [name] DOB: [birth year]  MR#: [id number]  TKW#:[id number]  Patient Care Team: Darlene Sheller, MD as PCP - General (Internal Medicine) Darlene [name], RD as Dietitian (Bariatrics) Darlene Klein, MD as Consulting Physician (General Surgery) Darlene Cruel, MD as Consulting Physician (Oncology) Darlene Koh, MD as Consulting Physician (Radiation Oncology) Darlene Kaufmann, RN as Registered Nurse Darlene Germany, RN as Registered Nurse Darlene Pi, MD as Consulting Physician (Endocrinology) PCP: Darlene Sheller, MD GYN: Darlene Boroughs FNP OTHER MD:  CHIEF COMPLAINT:  HER-2 positive , estrogen receptor negative breast cancer  CURRENT TREATMENT:  Neoadjuvant chemotherapy with anti-HER-2 treatment   BREAST CANCER HISTORY: From the original intake note:  "Darlene [name]" had screening mammography at her gynecologist suggestive of a possible abnormality in the right breast. However right diagnostic mammogram 04/12/2013 at [facility] was negative. More recently however, she noted a lump in her right breast and brought it to her gynecologist's attention. On 07/18/2014 the patient had bilateral diagnostic mammography with tomosynthesis at [facility]. The breast density was category B. In the right breast upper outer quadrant there was an area of focal asymmetry with indistinct margins.[name] Ultrasound was obtained and showed in addition to the mass in question, measuring 1.7 cm, an abnormal-appearing lymph node in the right axillary tail.  On 07/19/2014 the patient underwent biopsy of the right breast massin question as well as a right axillary lymph node. Both were positive for invasive ductal carcinoma, grade 2, estrogen and progesterone receptor negative, with an MIB-1 of 90%, and HER-2 pending. Incidentally the lymph node biopsy showed a lymphocytic inflammatory component but no lymph node architecture.  Her subsequent  history is as detailed below.  INTERVAL HISTORY:  Darlene [name] returns today for follow-up of her breast cancer, accompanied by her husband. Today is day 8, cycle 1 of carboplatin, docetaxel, trastuzumab and pertuzumab given every 3 weeks 6, with neulasta given on day 2.   REVIEW OF SYSTEMS: Darlene [name] denies fevers, chills, nausea, or vomiting. She has been having mushy stools, but not so loose that she needs imodium. Her nose alternates between runniness during the day, and dryness/epistaxis at night. She is using a "saline gel" that has been helping with the latter. Her blood sugars have been well controlled this past week. She is eating and drinking better. Her neulasta bone pain has reduced. She is having more depressive episodes and wonders if we could make an adjustment to her antidepressive medications. A detailed review of systems is otherwise stable.   PAST MEDICAL HISTORY: Past Medical History  Diagnosis Date  . Obesity   . Hypertension   . Hyperlipidemia   . Obstructive sleep apnea on CPAP   . Diabetes mellitus without complication     92+ years  . Eating disorder     binge eating  . Balance problem   . Arthritis     in fingers, shoulders  . Anemia     hx of  . Depression   . Ovarian cyst rupture     possible  . Clumsiness   . Breast cancer of upper-outer quadrant of right female breast 07/22/2014  . Breast cancer   . Anxiety   . Neuromuscular disorder     PAST SURGICAL HISTORY: Past Surgical History  Procedure Laterality Date  . Carpal tunnel release Right   . Carpal tunnel release Left   . Breath tek h pylori N/A 09/15/2012    Procedure: BREATH TEK  Darlene [name];  Surgeon: Darlene Earls, MD;  Location: [facility];  Service: General;  Laterality: N/A;  . Dupuytren contracture release    . Trigger finger release      x3  . Toe surgery  1970s    bone spur  . Rotator cuff repair Right 2005  . Gastric roux-en-y N/A 11/02/2012    Procedure: LAPAROSCOPIC ROUX-EN-Y GASTRIC;   Surgeon: Darlene Earls, MD;  Location: [facility];  Service: General;  Laterality: N/A;  . Bunionectomy Left 12/2013  . Portacath placement Left 08/02/2014    Procedure: INSERTION PORT-A-CATH;  Surgeon: Darlene Klein, MD;  Location: [facility];  Service: General;  Laterality: Left;    FAMILY HISTORY Family History  Problem Relation Age of Onset  . CVA Mother   . Diabetes Father   . Heart failure Father   . Hypertension Sister   . Diabetes Brother   . Diabetes Paternal Uncle    the patient's father died from complications of diabetes at age 27. The patient's mother died at age 4 with a subarachnoid hemorrhage. The patient had one brother, one sister. The only breast cancer was the patient's paternal grandmother diagnosed in her 50s. There is no history of ovarian cancer in the family.  GYNECOLOGIC HISTORY:  Patient's last menstrual period was 10/22/2007.  menarche age 79, the patient is GX P0. She went through the change of life in 2011. She did not take hormone replacement.  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 Her husband Darlene [name] is a retired Visual merchandiser (he taught at [employer]).  Darlene [name] has 2 children from an earlier marriage, Darlene [name] who teaches in [place], and Darlene [name],  who works at the [employer] in in [place]. He has 1 grand son, aged 20. The patient and her husband are not church attender's    ADVANCED DIRECTIVES:  Not in place   HEALTH MAINTENANCE: History  Substance Use Topics  . Smoking status: Never Smoker   . Smokeless tobacco: Never Used  . Alcohol Use: 0.0 oz/week    0 Standard drinks or equivalent per week     Colonoscopy:  May 2016  PAP:  Bone density: 07/05/2013 normal, with a T score of 0.4  Lipid panel:  Allergies  Allergen Reactions  . Other Itching    Peanuts in large quantities     Current Outpatient Prescriptions  Medication Sig  Dispense Refill  . aspirin 81 MG tablet Take 81 mg by mouth daily.      . Calcium Citrate-Vitamin D (CALCIUM CITRATE+D3 PO) Take 1,500 mg by mouth daily.    [name] dexamethasone (DECADRON) 4 MG tablet Take 2 tablets twice daily with food the day BEFORE chemo. 30 tablet 1  . glucose blood (ONETOUCH VERIO) test strip 1 each by Other route as needed for other. Use as instructed    . lidocaine-prilocaine (EMLA) cream Apply to affected area once 30 g 3  . LORazepam (ATIVAN) 0.5 MG tablet Take 1 tablet (0.5 mg total) by mouth at bedtime. 30 tablet 0  . losartan (COZAAR) 100 MG tablet Take 1 tablet by mouth daily.     . Multiple Vitamin (MULTIVITAMIN WITH MINERALS) TABS tablet Take 1 tablet by mouth daily. With iron    . NOVOLIN 70/30 (70-30) 100 UNIT/ML injection 10 units two times a day    . ondansetron (ZOFRAN) 8 MG tablet Take 1 tablet (8 mg total)  by mouth 2 (two) times daily. Start the day after chemo for 3 days. Then take as needed for nausea or vomiting. 30 tablet 1  . Probiotic Product (CVS PROBIOTIC PO) Take 1 capsule by mouth daily.    . prochlorperazine (COMPAZINE) 10 MG tablet Take one table before meals and at bedtime (4 times a day) starint the evening of chemo and continuing for 3 full days 30 tablet 1  . Vortioxetine HBr (BRINTELLIX) 10 MG TABS Take 10 mg by mouth every morning.     [name] acetaminophen (TYLENOL) 500 MG tablet Take 500 mg by mouth every 6 (six) hours as needed for pain.    [name] Sodium (COLACE PO) Take 1 capsule by mouth 2 (two) times daily.     . mometasone (NASONEX) 50 MCG/ACT nasal spray Place 2 sprays into the nose daily as needed.      . tobramycin-dexamethasone (TOBRADEX) ophthalmic solution Place 1 drop into both eyes 2 (two) times daily. (Patient not taking: Reported on 08/26/2014) 5 mL 1   No current facility-administered medications for this visit.    OBJECTIVE:  Middle-aged white woman in no acute distress Filed Vitals:   08/26/14 1135  BP: 142/68  Pulse: 54    Temp: 97.9 F (36.6 C)  Resp: 18     Body mass index is 31.87 kg/(m^2).    ECOG FS:1 - Symptomatic but completely ambulatory  Sclerae unicteric, pupils round and equal Oropharynx clear and moist-- no thrush or other lesions No cervical or supraclavicular adenopathy Lungs no rales or rhonchi Heart regular rate and rhythm Abd soft, nontender, positive bowel sounds MSK no focal spinal tenderness, no upper extremity lymphedema Neuro: nonfocal, well oriented, appropriate affect Breasts: deferred  LAB RESULTS:  CMP     Component Value Date/Time   NA 142 08/26/2014 1122   NA 140 02/18/2013 0827   K 4.6 08/26/2014 1122   K 4.3 02/18/2013 0827   CL 104 02/18/2013 0827   CO2 28 08/26/2014 1122   CO2 25 02/18/2013 0827   GLUCOSE 176* 08/26/2014 1122   GLUCOSE 168* 02/18/2013 0827   BUN 28.3* 08/26/2014 1122   BUN 26* 02/18/2013 0827   CREATININE 1.1 08/26/2014 1122   CREATININE 1.10 02/18/2013 0827   CREATININE 1.37* 11/02/2012 1230   CALCIUM 9.2 08/26/2014 1122   CALCIUM 9.5 02/18/2013 0827   PROT 6.2* 08/26/2014 1122   PROT 6.3 02/18/2013 0827   ALBUMIN 3.6 08/26/2014 1122   ALBUMIN 3.9 02/18/2013 0827   AST 19 08/26/2014 1122   AST 23 02/18/2013 0827   ALT 25 08/26/2014 1122   ALT 36* 02/18/2013 0827   ALKPHOS 83 08/26/2014 1122   ALKPHOS 68 02/18/2013 0827   BILITOT 0.56 08/26/2014 1122   BILITOT 0.9 02/18/2013 0827   GFRNONAA 55* 02/18/2013 0827   GFRNONAA 41* 11/02/2012 1230   GFRAA 63 02/18/2013 0827   GFRAA 48* 11/02/2012 1230    INo results found for: SPEP, UPEP  Lab Results  Component Value Date   WBC 12.5* 08/26/2014   NEUTROABS 8.2* 08/26/2014   HGB 11.5* 08/26/2014   HCT 33.7* 08/26/2014   MCV 90.7 08/26/2014   PLT 269 08/26/2014      Chemistry      Component Value Date/Time   NA 142 08/26/2014 1122   NA 140 02/18/2013 0827   K 4.6 08/26/2014 1122   K 4.3 02/18/2013 0827   CL 104 02/18/2013 0827   CO2 28 08/26/2014 1122   CO2 25  02/18/2013 0827   BUN 28.3* 08/26/2014 1122   BUN 26* 02/18/2013 0827   CREATININE 1.1 08/26/2014 1122   CREATININE 1.10 02/18/2013 0827   CREATININE 1.37* 11/02/2012 1230      Component Value Date/Time   CALCIUM 9.2 08/26/2014 1122   CALCIUM 9.5 02/18/2013 0827   ALKPHOS 83 08/26/2014 1122   ALKPHOS 68 02/18/2013 0827   AST 19 08/26/2014 1122   AST 23 02/18/2013 0827   ALT 25 08/26/2014 1122   ALT 36* 02/18/2013 0827   BILITOT 0.56 08/26/2014 1122   BILITOT 0.9 02/18/2013 0827       No results found for: LABCA2  No components found for: LABCA125  No results for input(s): INR in the last 168 hours.  Urinalysis    Component Value Date/Time   BILIRUBINUR neg 06/29/2013 1017   PROTEINUR trace 06/29/2013 1017   UROBILINOGEN negative 06/29/2013 1017   NITRITE neg 06/29/2013 1017   LEUKOCYTESUR Negative 06/29/2013 1017    STUDIES: Mr Breast Bilateral W Wo Contrast  08/01/2014   CLINICAL DATA:  Recently diagnosed right breast cancer, with malignant right axillary lymphadenopathy.  LABS:  None.  EXAM: BILATERAL BREAST MRI WITH AND WITHOUT CONTRAST  TECHNIQUE: Multiplanar, multisequence MR images of both breasts were obtained prior to and following the intravenous administration of 10 ml of MultiHance.  THREE-DIMENSIONAL MR IMAGE RENDERING ON INDEPENDENT WORKSTATION:  Three-dimensional MR images were rendered by post-processing of the original MR data on an independent workstation. The three-dimensional MR images were interpreted, and findings are reported in the following complete MRI report for this study. Three dimensional images were evaluated at the independent DynaCad workstation  COMPARISON:  Mammogram and ultrasound dated 07/19/2014 and 07/18/2014, and previous mammograms.  FINDINGS: Breast composition: b. Scattered fibroglandular tissue.  Background parenchymal enhancement: Mild.  Right breast: There is a spiculated mass in the right lateral central breast, middle depth, which  measures 3.7 by 2.0 by 1.8 cm, with a mixed pattern of enhancement, including rapid wash-in and washout. It contains a post biopsy metallic marker. This mass is a part of a larger non mass clumped area of enhancement, which involves large portion of the lateral central breast measuring 6.9 by 5.8 by 6.2 cm. Additionally, numerous few mm satellite nodules extend anteriorly, inferiorly and superiorly to this more confluent non mass regional area of enhancement. No suspicious nipple or skin enhancement is noted.  Left breast: No mass or abnormal enhancement.  Lymph nodes: There are at least 2 lymph nodes in the right axilla with abnormal appearance demonstrating cortical thickening and round shape. The larger of the two measures 1.8 cm in long-axis.  Ancillary findings:  None.  IMPRESSION: Large right lateral breast area of non mass enhancement, containing a spiculated mass within its more posterior portion, with surrounding numerous satellite lesions, consistent with multicentric right breast malignancy.  Probably malignant right axillary lymphadenopathy.  No MRI evidence of malignancy in the left breast.  RECOMMENDATION: Medical and surgical treatment of this malignancy, per treatment plan.  BI-RADS CATEGORY  6: Known biopsy-proven malignancy.   Electronically Signed   By: Fidela Salisbury M.D.   On: 08/01/2014 15:18   Dg Chest Port 1 View  08/02/2014   CLINICAL DATA:  Postoperative radiograph. Port-A-Cath placement. Evaluate tip position.  EXAM: PORTABLE CHEST - 1 VIEW  COMPARISON:  09/16/2012.  FINDINGS: New LEFT subclavian Port-A-Cath. The tip is in the lower superior vena cava, slightly less than 2 vertebral bodies inferior to the carina. Lung volumes are  lower than on prior exam.  No pneumothorax.  Aortic arch atherosclerosis.  IMPRESSION: Uncomplicated new LEFT subclavian Port-A-Cath placement with the tip just cranial to the junction of the superior vena cava and RIGHT atrium.   Electronically Signed    By: Dereck Ligas M.D.   On: 08/02/2014 11:31   Dg Fluoro Guide Cv Line-no Report  08/02/2014   CLINICAL DATA:    FLOURO GUIDE CV LINE  Fluoroscopy was utilized by the requesting physician.  No radiographic  interpretation.     ASSESSMENT: 64 y.o. [place] woman status post right breast and right axillary lymph node biopsy 07/19/2014 for a cT2  pN1, stage IIB  invasive ductal carcinoma, grade 2, estrogen and progesterone receptor negative, with an MIB-1 of 90%, and HER-2 positive  (1) neoadjuvant chemotherapy will consist of carboplatin, docetaxel, trastuzumab and pertuzumab given every 3 weeks 6, with onpro support, first dose 08/08/2014  (2) trastuzumab to be continued to complete one year  (a) echo 07/29/2014 shows a normal ejection fraction  (3) Definitive surgery to follow chemotherapy, with consideration of the Alliance trial  (4) adjuvant radiation to follow surgery   PLAN: Darlene [name] looks and feels well. The labs were reviewed in detail and were entirely stable. She will proceed with cycle 2 of carboplatin, docetaxel, trastuzumab and pertuzumab this upcoming Monday.                                            I advised she take her claritin daily indefinitely to help with her runny nose. The saline gel she has been using for her nose is already helping to reduce her epistaxis.  She is going to try doubling her vortioxetine since she has had more crying spells lately. I checked the dosing instructions, and this is reasonable. She will of course alert her psychiatrist of the change.  She claims her insurance company denied the MRI she had prior to her [place] date. She will bring these paper in to review next week.   Darlene [name] will return on 8/15 for labs and a nadir visit. She understands and agrees with the plan. She knows the goal of treatment in her case is cure. She has been encouraged to call with any issues that might arise before her next visit here.  Total time spent in appointment  was 25 minutes, with greater than 50% of the time spent face to face with the patient.                                                                                                                                                                   Genelle Gather  [place], NP   08/26/2014 5:04 PM

## 2014-08-29 ENCOUNTER — Telehealth: Payer: Self-pay | Admitting: *Deleted

## 2014-08-29 ENCOUNTER — Ambulatory Visit: Payer: BLUE CROSS/BLUE SHIELD | Admitting: Nurse Practitioner

## 2014-08-29 ENCOUNTER — Other Ambulatory Visit: Payer: BLUE CROSS/BLUE SHIELD

## 2014-08-29 ENCOUNTER — Ambulatory Visit: Payer: BLUE CROSS/BLUE SHIELD | Admitting: Nutrition

## 2014-08-29 ENCOUNTER — Ambulatory Visit (HOSPITAL_BASED_OUTPATIENT_CLINIC_OR_DEPARTMENT_OTHER): Payer: BLUE CROSS/BLUE SHIELD

## 2014-08-29 ENCOUNTER — Other Ambulatory Visit: Payer: Self-pay | Admitting: Hematology and Oncology

## 2014-08-29 ENCOUNTER — Telehealth: Payer: Self-pay | Admitting: Nurse Practitioner

## 2014-08-29 ENCOUNTER — Encounter: Payer: Self-pay | Admitting: *Deleted

## 2014-08-29 VITALS — BP 139/62 | HR 91 | Temp 98.3°F | Resp 18

## 2014-08-29 DIAGNOSIS — Z5112 Encounter for antineoplastic immunotherapy: Secondary | ICD-10-CM | POA: Diagnosis not present

## 2014-08-29 DIAGNOSIS — C50411 Malignant neoplasm of upper-outer quadrant of right female breast: Secondary | ICD-10-CM

## 2014-08-29 DIAGNOSIS — Z5189 Encounter for other specified aftercare: Secondary | ICD-10-CM | POA: Diagnosis not present

## 2014-08-29 DIAGNOSIS — Z5111 Encounter for antineoplastic chemotherapy: Secondary | ICD-10-CM

## 2014-08-29 DIAGNOSIS — C773 Secondary and unspecified malignant neoplasm of axilla and upper limb lymph nodes: Secondary | ICD-10-CM

## 2014-08-29 MED ORDER — SODIUM CHLORIDE 0.9 % IV SOLN
420.0000 mg | Freq: Once | INTRAVENOUS | Status: AC
  Administered 2014-08-29: 420 mg via INTRAVENOUS
  Filled 2014-08-29: qty 14

## 2014-08-29 MED ORDER — SODIUM CHLORIDE 0.9 % IV SOLN
Freq: Once | INTRAVENOUS | Status: AC
  Administered 2014-08-29: 10:00:00 via INTRAVENOUS
  Filled 2014-08-29: qty 8

## 2014-08-29 MED ORDER — SODIUM CHLORIDE 0.9 % IV SOLN
Freq: Once | INTRAVENOUS | Status: AC
  Administered 2014-08-29: 09:00:00 via INTRAVENOUS

## 2014-08-29 MED ORDER — ACETAMINOPHEN 325 MG PO TABS
650.0000 mg | ORAL_TABLET | Freq: Once | ORAL | Status: AC
  Administered 2014-08-29: 650 mg via ORAL

## 2014-08-29 MED ORDER — PEGFILGRASTIM 6 MG/0.6ML ~~LOC~~ PSKT
6.0000 mg | PREFILLED_SYRINGE | Freq: Once | SUBCUTANEOUS | Status: AC
  Administered 2014-08-29: 6 mg via SUBCUTANEOUS
  Filled 2014-08-29: qty 0.6

## 2014-08-29 MED ORDER — SODIUM CHLORIDE 0.9 % IJ SOLN
10.0000 mL | INTRAMUSCULAR | Status: DC | PRN
  Administered 2014-08-29: 10 mL
  Filled 2014-08-29: qty 10

## 2014-08-29 MED ORDER — TRASTUZUMAB CHEMO INJECTION 440 MG
6.0000 mg/kg | Freq: Once | INTRAVENOUS | Status: AC
  Administered 2014-08-29: 609 mg via INTRAVENOUS
  Filled 2014-08-29: qty 29

## 2014-08-29 MED ORDER — HEPARIN SOD (PORK) LOCK FLUSH 100 UNIT/ML IV SOLN
500.0000 [IU] | Freq: Once | INTRAVENOUS | Status: AC | PRN
  Administered 2014-08-29: 500 [IU]
  Filled 2014-08-29: qty 5

## 2014-08-29 MED ORDER — SODIUM CHLORIDE 0.9 % IV SOLN
75.0000 mg/m2 | Freq: Once | INTRAVENOUS | Status: AC
  Administered 2014-08-29: 170 mg via INTRAVENOUS
  Filled 2014-08-29: qty 17

## 2014-08-29 MED ORDER — DIPHENHYDRAMINE HCL 25 MG PO CAPS
25.0000 mg | ORAL_CAPSULE | Freq: Once | ORAL | Status: AC
Start: 2014-08-29 — End: 2014-08-29
  Administered 2014-08-29: 25 mg via ORAL

## 2014-08-29 MED ORDER — SODIUM CHLORIDE 0.9 % IV SOLN
550.0000 mg | Freq: Once | INTRAVENOUS | Status: AC
  Administered 2014-08-29: 550 mg via INTRAVENOUS
  Filled 2014-08-29: qty 55

## 2014-08-29 MED ORDER — ACETAMINOPHEN 325 MG PO TABS
ORAL_TABLET | ORAL | Status: AC
  Filled 2014-08-29: qty 2

## 2014-08-29 MED ORDER — DIPHENHYDRAMINE HCL 25 MG PO CAPS
ORAL_CAPSULE | ORAL | Status: AC
  Filled 2014-08-29: qty 1

## 2014-08-29 MED ORDER — SODIUM CHLORIDE 0.9 % IV SOLN: Freq: Once | INTRAVENOUS | Status: DC

## 2014-08-29 NOTE — Progress Notes (Signed)
64 year old female diagnosed with ER positive breast cancer receiving neo-adjuvant chemotherapy.  She is a patient of Dr. Jana Hakim.  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  Medications include calcium with vitamin D, Decadron, Colace, Ativan, multivitamin, Novolin, Zofran, probiotics, and Compazine.  Labs include glucose 176 and albumin 3.6 August 5.  Height: 68 inches. Weight: 215.9 pounds August 5. Usual body weight: 295 pounds September 2014. BMI: 31.87.  Patient reports she is not having any nutrition side effects at this time. Patient consumes meals/snacks 4-5 times a day. States she drinks one Unjury protein shake daily. She did experience diarrhea after chemotherapy but this is resolved. Patient endorses greater than 100 pound weight loss secondary to Roux-en-Y.  Nutrition diagnosis:  Food and nutrition related knowledge deficit related to breast cancer and associated treatments as evidenced by no prior need for nutrition related information.  Intervention:  Patient educated to continue small frequent meals and snacks utilizing high protein foods as tolerated. Recommended patient continue one Unjury protein shake daily. Encouraged patient to continue following a healthy diet as tolerated, avoiding concentrated sugars. Reviewed high protein foods sources with patient. Provided brief education on strategies for eating if she develops diarrhea. Fact sheets were provided.  Questions were answered.  Teach back method was used. Provided samples of Unjury protein powder.  Monitoring, evaluation, goals: Patient will tolerate high protein, healthy plant-based diet with slow weight loss.  No greater than 2 pounds weekly.  Next visit: Patient will contact me for questions and concerns.  **Disclaimer: This note was dictated with voice recognition software. Similar  sounding words can inadvertently be transcribed and this note may contain transcription errors which may not have been corrected upon publication of note.**

## 2014-08-29 NOTE — Telephone Encounter (Signed)
Appointments altered per pof and patient will get a new avs in chemo

## 2014-08-29 NOTE — Patient Instructions (Signed)
[facility] Discharge Instructions for Patients Receiving Chemotherapy  Today you received the following chemotherapy agents: Herceptin, Perjeta, Taxotere, Carboplatin   To help prevent nausea and vomiting after your treatment, we encourage you to take your nausea medication as directed.    If you develop nausea and vomiting that is not controlled by your nausea medication, call the clinic.   BELOW ARE SYMPTOMS THAT SHOULD BE REPORTED IMMEDIATELY:  *FEVER GREATER THAN 100.5 F  *CHILLS WITH OR WITHOUT FEVER  NAUSEA AND VOMITING THAT IS NOT CONTROLLED WITH YOUR NAUSEA MEDICATION  *UNUSUAL SHORTNESS OF BREATH  *UNUSUAL BRUISING OR BLEEDING  TENDERNESS IN MOUTH AND THROAT WITH OR WITHOUT PRESENCE OF ULCERS  *URINARY PROBLEMS  *BOWEL PROBLEMS  UNUSUAL RASH Items with * indicate a potential emergency and should be followed up as soon as possible.  Feel free to call the clinic you have any questions or concerns. The clinic phone number is [phone].  Please show the [facility] at check-in to the Emergency Department and triage nurse.

## 2014-08-29 NOTE — Telephone Encounter (Signed)
Per patient request I have moved appts from 8/29 to 8/26. I also cancelled flush appts that don't follow treatment

## 2014-08-30 ENCOUNTER — Other Ambulatory Visit: Payer: Self-pay | Admitting: Nurse Practitioner

## 2014-08-31 ENCOUNTER — Ambulatory Visit: Payer: BLUE CROSS/BLUE SHIELD

## 2014-09-05 ENCOUNTER — Encounter: Payer: Self-pay | Admitting: Nurse Practitioner

## 2014-09-05 ENCOUNTER — Ambulatory Visit (HOSPITAL_BASED_OUTPATIENT_CLINIC_OR_DEPARTMENT_OTHER): Payer: BLUE CROSS/BLUE SHIELD | Admitting: Nurse Practitioner

## 2014-09-05 ENCOUNTER — Other Ambulatory Visit: Payer: BLUE CROSS/BLUE SHIELD

## 2014-09-05 ENCOUNTER — Other Ambulatory Visit (HOSPITAL_BASED_OUTPATIENT_CLINIC_OR_DEPARTMENT_OTHER): Payer: BLUE CROSS/BLUE SHIELD

## 2014-09-05 VITALS — BP 132/57 | HR 78 | Temp 98.6°F | Resp 18 | Ht 69.0 in | Wt 212.8 lb

## 2014-09-05 DIAGNOSIS — C773 Secondary and unspecified malignant neoplasm of axilla and upper limb lymph nodes: Secondary | ICD-10-CM

## 2014-09-05 DIAGNOSIS — C50411 Malignant neoplasm of upper-outer quadrant of right female breast: Secondary | ICD-10-CM | POA: Diagnosis not present

## 2014-09-05 DIAGNOSIS — R197 Diarrhea, unspecified: Secondary | ICD-10-CM

## 2014-09-05 DIAGNOSIS — Z171 Estrogen receptor negative status [ER-]: Secondary | ICD-10-CM | POA: Diagnosis not present

## 2014-09-05 LAB — COMPREHENSIVE METABOLIC PANEL (CC13)
ALT: 27 U/L (ref 0–55)
AST: 26 U/L (ref 5–34)
Albumin: 3.4 g/dL — ABNORMAL LOW (ref 3.5–5.0)
Alkaline Phosphatase: 88 U/L (ref 40–150)
Anion Gap: 8 mEq/L (ref 3–11)
BUN: 24.5 mg/dL (ref 7.0–26.0)
CO2: 25 mEq/L (ref 22–29)
Calcium: 9.3 mg/dL (ref 8.4–10.4)
Chloride: 105 mEq/L (ref 98–109)
Creatinine: 1.2 mg/dL — ABNORMAL HIGH (ref 0.6–1.1)
EGFR: 49 mL/min/{1.73_m2} — ABNORMAL LOW (ref >90)
Glucose: 157 mg/dl — ABNORMAL HIGH (ref 70–140)
Potassium: 4.4 mEq/L (ref 3.5–5.1)
Sodium: 138 mEq/L (ref 136–145)
Total Bilirubin: 0.48 mg/dL (ref 0.20–1.20)
Total Protein: 5.9 g/dL — ABNORMAL LOW (ref 6.4–8.3)

## 2014-09-05 LAB — CBC WITH DIFFERENTIAL/PLATELET
BASO%: 0.5 % (ref 0.0–2.0)
Basophils Absolute: 0.1 10*3/uL (ref 0.0–0.1)
EOS%: 2.5 % (ref 0.0–7.0)
Eosinophils Absolute: 0.2 10*3/uL (ref 0.0–0.5)
HCT: 30.9 % — ABNORMAL LOW (ref 34.8–46.6)
HGB: 10.6 g/dL — ABNORMAL LOW (ref 11.6–15.9)
LYMPH%: 22.3 % (ref 14.0–49.7)
MCH: 31 pg (ref 25.1–34.0)
MCHC: 34.2 g/dL (ref 31.5–36.0)
MCV: 90.5 fL (ref 79.5–101.0)
MONO#: 1.9 10*3/uL — ABNORMAL HIGH (ref 0.1–0.9)
MONO%: 18.9 % — ABNORMAL HIGH (ref 0.0–14.0)
NEUT#: 5.6 10*3/uL (ref 1.5–6.5)
NEUT%: 55.8 % (ref 38.4–76.8)
Platelets: 168 10*3/uL (ref 145–400)
RBC: 3.41 10*6/uL — ABNORMAL LOW (ref 3.70–5.45)
RDW: 13.2 % (ref 11.2–14.5)
WBC: 10 10*3/uL (ref 3.9–10.3)
lymph#: 2.2 10*3/uL (ref 0.9–3.3)

## 2014-09-05 NOTE — Progress Notes (Signed)
[facility]  [phone] [phone]     ID: VERITA KURODA DOB: [birth year]  MR#: [id number]  UEK#:[id number]  Patient Care Team: Thressa Sheller, MD as PCP - General (Internal Medicine) Bryson Ha Himmelrich, RD as Dietitian (Bariatrics) Stark Klein, MD as Consulting Physician (General Surgery) Chauncey Cruel, MD as Consulting Physician (Oncology) Arloa Koh, MD as Consulting Physician (Radiation Oncology) Mauro Kaufmann, RN as Registered Nurse Rockwell Germany, RN as Registered Nurse Jacelyn Pi, MD as Consulting Physician (Endocrinology) PCP: Thressa Sheller, MD GYN: Kem Boroughs FNP OTHER MD:  CHIEF COMPLAINT:  HER-2 positive , estrogen receptor negative breast cancer  CURRENT TREATMENT:  Neoadjuvant chemotherapy with anti-HER-2 treatment   BREAST CANCER HISTORY: From the original intake note:  "Darlene [name]" had screening mammography at her gynecologist suggestive of a possible abnormality in the right breast. However right diagnostic mammogram 04/12/2013 at [facility] was negative. More recently however, she noted a lump in her right breast and brought it to her gynecologist's attention. On 07/18/2014 the patient had bilateral diagnostic mammography with tomosynthesis at [facility]. The breast density was category B. In the right breast upper outer quadrant there was an area of focal asymmetry with indistinct margins.[name] Ultrasound was obtained and showed in addition to the mass in question, measuring 1.7 cm, an abnormal-appearing lymph node in the right axillary tail.  On 07/19/2014 the patient underwent biopsy of the right breast massin question as well as a right axillary lymph node. Both were positive for invasive ductal carcinoma, grade 2, estrogen and progesterone receptor negative, with an MIB-1 of 90%, and HER-2 pending. Incidentally the lymph node biopsy showed a lymphocytic inflammatory component but no lymph node architecture.  Her subsequent  history is as detailed below.  INTERVAL HISTORY:  Darlene [name] returns today for follow-up of her breast cancer, accompanied by her husband. Today is day 8, cycle 2 of carboplatin, docetaxel, trastuzumab and pertuzumab given every 3 weeks 6, with neulasta given on day 2. She shaved her head this weekend, and it was emotional at the time, but increasing her brintillex to 95m daily has been helpful. She is no longer having random crying spells.  REVIEW OF SYSTEMS: LJeani Hawkingdenies fevers or chills. She has some mild nausea and 3 episodes of uncorrelated vomiting. The vomiting tended to be random and unaccompanied by spells of nausea. She started out having constipation on day 2, but since then she has had diarrhea. It seems to be every other day. She is using imodium to control this. Her appetite is down, but she is drinking well and using protein replacements. Her bone pain was the worst on day 3, but after tylenol this decreased. Her blood sugars are the worst on days 2 and 3, but normalizes rather quickly. She only using insulin twice daily. She wakes up with blood nasal drainage, but this turns to runny clear drainage during the day. She continues to use her saline gel. A detailed review of systems is otherwise stable.  PAST MEDICAL HISTORY: Past Medical History  Diagnosis Date  . Obesity   . Hypertension   . Hyperlipidemia   . Obstructive sleep apnea on CPAP   . Diabetes mellitus without complication     62+years  . Eating disorder     binge eating  . Balance problem   . Arthritis     in fingers, shoulders  . Anemia     hx of  . Depression   . Ovarian cyst rupture  possible  . Clumsiness   . Breast cancer of upper-outer quadrant of right female breast 07/22/2014  . Breast cancer   . Anxiety   . Neuromuscular disorder     PAST SURGICAL HISTORY: Past Surgical History  Procedure Laterality Date  . Carpal tunnel release Right   . Carpal tunnel release Left   . Breath tek h pylori N/A  09/15/2012    Procedure: BREATH TEK H PYLORI;  Surgeon: Pedro Earls, MD;  Location: [facility];  Service: General;  Laterality: N/A;  . Dupuytren contracture release    . Trigger finger release      x3  . Toe surgery  1970s    bone spur  . Rotator cuff repair Right 2005  . Gastric roux-en-y N/A 11/02/2012    Procedure: LAPAROSCOPIC ROUX-EN-Y GASTRIC;  Surgeon: Pedro Earls, MD;  Location: [facility];  Service: General;  Laterality: N/A;  . Bunionectomy Left 12/2013  . Portacath placement Left 08/02/2014    Procedure: INSERTION PORT-A-CATH;  Surgeon: Stark Klein, MD;  Location: [facility];  Service: General;  Laterality: Left;    FAMILY HISTORY Family History  Problem Relation Age of Onset  . CVA Mother   . Diabetes Father   . Heart failure Father   . Hypertension Sister   . Diabetes Brother   . Diabetes Paternal Uncle    the patient's father died from complications of diabetes at age 32. The patient's mother died at age 76 with a subarachnoid hemorrhage. The patient had one brother, one sister. The only breast cancer was the patient's paternal grandmother diagnosed in her 70s. There is no history of ovarian cancer in the family.  GYNECOLOGIC HISTORY:  Patient's last menstrual period was 10/22/2007.  menarche age 21, the patient is GX P0. She went through the change of life in 2011. She did not take hormone replacement.  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 Her husband [name] is a retired Visual merchandiser (he taught at [employer]).  [name] has 2 children from an earlier marriage, Rockey Situ who teaches in [name], and Venda Dice,  who works at the [employer] in in [place]. He has 1 grand son, aged 29. The patient and her husband are not church attender's    ADVANCED DIRECTIVES:  Not in place   HEALTH MAINTENANCE: Social History  Substance Use Topics  . Smoking status:  Never Smoker   . Smokeless tobacco: Never Used  . Alcohol Use: 0.0 oz/week    0 Standard drinks or equivalent per week     Colonoscopy:  May 2016  PAP:  Bone density: 07/05/2013 normal, with a T score of 0.4  Lipid panel:  Allergies  Allergen Reactions  . Other Itching    Peanuts in large quantities     Current Outpatient Prescriptions  Medication Sig Dispense Refill  . acetaminophen (TYLENOL) 500 MG tablet Take 500 mg by mouth every 6 (six) hours as needed for pain.    [name] aspirin 81 MG tablet Take 81 mg by mouth daily.      . Calcium Citrate-Vitamin D (CALCIUM CITRATE+D3 PO) Take 1,500 mg by mouth daily.    [name] dexamethasone (DECADRON) 4 MG tablet Take 2 tablets twice daily with food the day BEFORE chemo. 30 tablet 1  . Docusate Sodium (COLACE PO) Take 1 capsule by mouth 2 (two) times daily.     [name] glucose blood (ONETOUCH VERIO)  test strip 1 each by Other route as needed for other. Use as instructed    . lidocaine-prilocaine (EMLA) cream Apply to affected area once 30 g 3  . LORazepam (ATIVAN) 0.5 MG tablet Take 1 tablet (0.5 mg total) by mouth at bedtime. 30 tablet 0  . losartan (COZAAR) 100 MG tablet Take 1 tablet by mouth daily.     . mometasone (NASONEX) 50 MCG/ACT nasal spray Place 2 sprays into the nose daily as needed.      . Multiple Vitamin (MULTIVITAMIN WITH MINERALS) TABS tablet Take 1 tablet by mouth daily. With iron    . NOVOLIN 70/30 (70-30) 100 UNIT/ML injection 10 units two times a day    . ondansetron (ZOFRAN) 8 MG tablet Take 1 tablet (8 mg total) by mouth 2 (two) times daily. Start the day after chemo for 3 days. Then take as needed for nausea or vomiting. 30 tablet 1  . Probiotic Product ([employer] PROBIOTIC PO) Take 1 capsule by mouth daily.    . prochlorperazine (COMPAZINE) 10 MG tablet Take one table before meals and at bedtime (4 times a day) starint the evening of chemo and continuing for 3 full days 30 tablet 1  . tobramycin-dexamethasone (TOBRADEX) ophthalmic  solution Place 1 drop into both eyes 2 (two) times daily. (Patient not taking: Reported on 08/26/2014) 5 mL 1  . Vortioxetine HBr (BRINTELLIX) 10 MG TABS Take 10 mg by mouth every morning.      No current facility-administered medications for this visit.    OBJECTIVE:  Middle-aged white woman in no acute distress Filed Vitals:   09/05/14 1443  BP: 132/57  Pulse: 78  Temp: 98.6 F (37 C)  Resp: 18     Body mass index is 31.41 kg/(m^2).    ECOG FS:1 - Symptomatic but completely ambulatory  Skin: warm, dry  HEENT: sclerae anicteric, conjunctivae pink, oropharynx clear. No thrush or mucositis.  Lymph Nodes: No cervical or supraclavicular lymphadenopathy  Lungs: clear to auscultation bilaterally, no rales, wheezes, or rhonci  Heart: regular rate and rhythm  Abdomen: round, soft, non tender, positive bowel sounds  Musculoskeletal: No focal spinal tenderness, no peripheral edema  Neuro: non focal, well oriented, positive affect  Breasts: deferred  LAB RESULTS:  CMP     Component Value Date/Time   NA 138 09/05/2014 1422   NA 140 02/18/2013 0827   K 4.4 09/05/2014 1422   K 4.3 02/18/2013 0827   CL 104 02/18/2013 0827   CO2 25 09/05/2014 1422   CO2 25 02/18/2013 0827   GLUCOSE 157* 09/05/2014 1422   GLUCOSE 168* 02/18/2013 0827   BUN 24.5 09/05/2014 1422   BUN 26* 02/18/2013 0827   CREATININE 1.2* 09/05/2014 1422   CREATININE 1.10 02/18/2013 0827   CREATININE 1.37* 11/02/2012 1230   CALCIUM 9.3 09/05/2014 1422   CALCIUM 9.5 02/18/2013 0827   PROT 5.9* 09/05/2014 1422   PROT 6.3 02/18/2013 0827   ALBUMIN 3.4* 09/05/2014 1422   ALBUMIN 3.9 02/18/2013 0827   AST 26 09/05/2014 1422   AST 23 02/18/2013 0827   ALT 27 09/05/2014 1422   ALT 36* 02/18/2013 0827   ALKPHOS 88 09/05/2014 1422   ALKPHOS 68 02/18/2013 0827   BILITOT 0.48 09/05/2014 1422   BILITOT 0.9 02/18/2013 0827   GFRNONAA 55* 02/18/2013 0827   GFRNONAA 41* 11/02/2012 1230   GFRAA 63 02/18/2013 0827   GFRAA  48* 11/02/2012 1230    INo results found for: SPEP, UPEP  Lab Results  Component Value Date   WBC 10.0 09/05/2014   NEUTROABS 5.6 09/05/2014   HGB 10.6* 09/05/2014   HCT 30.9* 09/05/2014   MCV 90.5 09/05/2014   PLT 168 09/05/2014      Chemistry      Component Value Date/Time   NA 138 09/05/2014 1422   NA 140 02/18/2013 0827   K 4.4 09/05/2014 1422   K 4.3 02/18/2013 0827   CL 104 02/18/2013 0827   CO2 25 09/05/2014 1422   CO2 25 02/18/2013 0827   BUN 24.5 09/05/2014 1422   BUN 26* 02/18/2013 0827   CREATININE 1.2* 09/05/2014 1422   CREATININE 1.10 02/18/2013 0827   CREATININE 1.37* 11/02/2012 1230      Component Value Date/Time   CALCIUM 9.3 09/05/2014 1422   CALCIUM 9.5 02/18/2013 0827   ALKPHOS 88 09/05/2014 1422   ALKPHOS 68 02/18/2013 0827   AST 26 09/05/2014 1422   AST 23 02/18/2013 0827   ALT 27 09/05/2014 1422   ALT 36* 02/18/2013 0827   BILITOT 0.48 09/05/2014 1422   BILITOT 0.9 02/18/2013 0827       No results found for: LABCA2  No components found for: DHRCB638  No results for input(s): INR in the last 168 hours.  Urinalysis    Component Value Date/Time   BILIRUBINUR neg 06/29/2013 1017   PROTEINUR trace 06/29/2013 1017   UROBILINOGEN negative 06/29/2013 1017   NITRITE neg 06/29/2013 1017   LEUKOCYTESUR Negative 06/29/2013 1017    STUDIES: No results found.  ASSESSMENT: 64 y.o. [place] woman status post right breast and right axillary lymph node biopsy 07/19/2014 for a cT2  pN1, stage IIB  invasive ductal carcinoma, grade 2, estrogen and progesterone receptor negative, with an MIB-1 of 90%, and HER-2 positive  (1) neoadjuvant chemotherapy will consist of carboplatin, docetaxel, trastuzumab and pertuzumab given every 3 weeks 6, with onpro support, first dose 08/08/2014  (2) trastuzumab to be continued to complete one year  (a) echo 07/29/2014 shows a normal ejection fraction  (3) Definitive surgery to follow chemotherapy, with  consideration of the [facility] trial  (4) adjuvant radiation to follow surgery   PLAN: Darlene [name] had some rough days, but again responded to her side effects appropriately. The labs were reviewed in detail and were stable. She already feels like her tumor is shrinking.   I offered a supplement to her imodium such as questran powder or lomotil, but she declined. She will continue on probiotics. At this time her nausea is under control and she is well hydrated.   Darlene [name] will return in 2 weeks for the start of cycle 3. She understands and agrees with this plan. She knows the goal of treatment in her case is cure. She has been encouraged to call with any issues that might arise before her next visit here.   Total time spent in appointment was 25 minutes, with greater than 50% of the time spent face to face with the patient.  Laurie Panda, NP   09/05/2014 4:07 PM

## 2014-09-16 ENCOUNTER — Other Ambulatory Visit (HOSPITAL_BASED_OUTPATIENT_CLINIC_OR_DEPARTMENT_OTHER): Payer: BLUE CROSS/BLUE SHIELD

## 2014-09-16 ENCOUNTER — Ambulatory Visit: Payer: BLUE CROSS/BLUE SHIELD | Admitting: Nurse Practitioner

## 2014-09-16 ENCOUNTER — Other Ambulatory Visit: Payer: BLUE CROSS/BLUE SHIELD

## 2014-09-16 ENCOUNTER — Ambulatory Visit (HOSPITAL_BASED_OUTPATIENT_CLINIC_OR_DEPARTMENT_OTHER): Payer: BLUE CROSS/BLUE SHIELD | Admitting: Nurse Practitioner

## 2014-09-16 ENCOUNTER — Encounter: Payer: Self-pay | Admitting: Nurse Practitioner

## 2014-09-16 VITALS — BP 138/59 | HR 66 | Temp 98.4°F | Resp 18 | Ht 69.0 in | Wt 218.7 lb

## 2014-09-16 DIAGNOSIS — C50411 Malignant neoplasm of upper-outer quadrant of right female breast: Secondary | ICD-10-CM | POA: Diagnosis not present

## 2014-09-16 DIAGNOSIS — C773 Secondary and unspecified malignant neoplasm of axilla and upper limb lymph nodes: Secondary | ICD-10-CM | POA: Diagnosis not present

## 2014-09-16 DIAGNOSIS — R197 Diarrhea, unspecified: Secondary | ICD-10-CM | POA: Diagnosis not present

## 2014-09-16 DIAGNOSIS — Z171 Estrogen receptor negative status [ER-]: Secondary | ICD-10-CM

## 2014-09-16 LAB — COMPREHENSIVE METABOLIC PANEL (CC13)
ALT: 38 U/L (ref 0–55)
AST: 25 U/L (ref 5–34)
Albumin: 3.3 g/dL — ABNORMAL LOW (ref 3.5–5.0)
Alkaline Phosphatase: 81 U/L (ref 40–150)
Anion Gap: 6 mEq/L (ref 3–11)
BUN: 22.7 mg/dL (ref 7.0–26.0)
CO2: 28 mEq/L (ref 22–29)
Calcium: 9.1 mg/dL (ref 8.4–10.4)
Chloride: 107 mEq/L (ref 98–109)
Creatinine: 1.2 mg/dL — ABNORMAL HIGH (ref 0.6–1.1)
EGFR: 50 mL/min/{1.73_m2} — ABNORMAL LOW (ref >90)
Glucose: 154 mg/dl — ABNORMAL HIGH (ref 70–140)
Potassium: 4.8 mEq/L (ref 3.5–5.1)
Sodium: 141 mEq/L (ref 136–145)
Total Bilirubin: 0.65 mg/dL (ref 0.20–1.20)
Total Protein: 5.7 g/dL — ABNORMAL LOW (ref 6.4–8.3)

## 2014-09-16 LAB — CBC WITH DIFFERENTIAL/PLATELET
BASO%: 0.5 % (ref 0.0–2.0)
Basophils Absolute: 0 10*3/uL (ref 0.0–0.1)
EOS%: 1.1 % (ref 0.0–7.0)
Eosinophils Absolute: 0.1 10*3/uL (ref 0.0–0.5)
HCT: 31.6 % — ABNORMAL LOW (ref 34.8–46.6)
HGB: 10.8 g/dL — ABNORMAL LOW (ref 11.6–15.9)
LYMPH%: 18.4 % (ref 14.0–49.7)
MCH: 31.3 pg (ref 25.1–34.0)
MCHC: 34 g/dL (ref 31.5–36.0)
MCV: 92 fL (ref 79.5–101.0)
MONO#: 0.6 10*3/uL (ref 0.1–0.9)
MONO%: 5.7 % (ref 0.0–14.0)
NEUT#: 7.4 10*3/uL — ABNORMAL HIGH (ref 1.5–6.5)
NEUT%: 74.3 % (ref 38.4–76.8)
Platelets: 204 10*3/uL (ref 145–400)
RBC: 3.44 10*6/uL — ABNORMAL LOW (ref 3.70–5.45)
RDW: 14.4 % (ref 11.2–14.5)
WBC: 10 10*3/uL (ref 3.9–10.3)
lymph#: 1.8 10*3/uL (ref 0.9–3.3)

## 2014-09-16 MED ORDER — DIPHENOXYLATE-ATROPINE 2.5-0.025 MG PO TABS: 1.0000 | ORAL_TABLET | Freq: Four times a day (QID) | ORAL | Status: DC | PRN

## 2014-09-16 MED ORDER — VORTIOXETINE HBR 20 MG PO TABS: 20.0000 mg | ORAL_TABLET | Freq: Every day | ORAL | Status: DC

## 2014-09-16 NOTE — Progress Notes (Signed)
Darlene [name]  [phone] Fax:(336) [phone]     ID: Darlene [name] DOB: [birth year]  MR#: [id number]  XLK#:[id number]  Patient Care Team: Darlene Sheller, MD as PCP - General (Internal Medicine) Bryson Ha Darlene [name], RD as Dietitian (Bariatrics) Darlene Klein, MD as Consulting Physician (General Surgery) Darlene Cruel, MD as Consulting Physician (Oncology) Darlene Koh, MD as Consulting Physician (Radiation Oncology) Darlene Kaufmann, RN as Registered Nurse Darlene Germany, RN as Registered Nurse Darlene Pi, MD as Consulting Physician (Endocrinology) PCP: Darlene Sheller, MD GYN: Darlene Boroughs FNP OTHER MD:  CHIEF COMPLAINT:  HER-2 positive , estrogen receptor negative breast cancer  CURRENT TREATMENT:  Neoadjuvant chemotherapy with anti-HER-2 treatment   BREAST CANCER HISTORY: From the original intake note:  "Darlene [name]" had screening mammography at her gynecologist suggestive of a possible abnormality in the right breast. However right diagnostic mammogram 04/12/2013 at [facility] was negative. More recently however, she noted a lump in her right breast and brought it to her gynecologist's attention. On 07/18/2014 the patient had bilateral diagnostic mammography with tomosynthesis at [facility]. The breast density was category B. In the right breast upper outer quadrant there was an area of focal asymmetry with indistinct margins.[name] Ultrasound was obtained and showed in addition to the mass in question, measuring 1.7 cm, an abnormal-appearing lymph node in the right axillary tail.  On 07/19/2014 the patient underwent biopsy of the right breast massin question as well as a right axillary lymph node. Both were positive for invasive ductal carcinoma, grade 2, estrogen and progesterone receptor negative, with an MIB-1 of 90%, and HER-2 pending. Incidentally the lymph node biopsy showed a lymphocytic inflammatory component but no lymph node architecture.  Her subsequent  history is as detailed below.  INTERVAL HISTORY:  Darlene [name] returns today for follow-up of her breast cancer, accompanied by her husband. Today is day 19, cycle 2 of carboplatin, docetaxel, trastuzumab and pertuzumab given every 3 weeks 6, with neulasta given on day 2.   REVIEW OF SYSTEMS: Darlene [name] denies fevers or chills. She has had persistent nausea this week, and has been taking her antiemetics as prescribed. She is not having frequent stools but they are loose every day. She is on imodium PRN. Her appetite is down but she is drinking well and using protein replacements. She denies pain but her head is tender since her hair has fallen out. She denies mouth sores or rashes. Her right eye is teary. A detailed review of systems is otherwise stable.  PAST MEDICAL HISTORY: Past Medical History  Diagnosis Date  . Obesity   . Hypertension   . Hyperlipidemia   . Obstructive sleep apnea on CPAP   . Diabetes mellitus without complication     36+ years  . Eating disorder     binge eating  . Balance problem   . Arthritis     in fingers, shoulders  . Anemia     hx of  . Depression   . Ovarian cyst rupture     possible  . Clumsiness   . Breast cancer of upper-outer quadrant of right female breast 07/22/2014  . Breast cancer   . Anxiety   . Neuromuscular disorder     PAST SURGICAL HISTORY: Past Surgical History  Procedure Laterality Date  . Carpal tunnel release Right   . Carpal tunnel release Left   . Breath tek h pylori N/A 09/15/2012    Procedure: BREATH TEK H PYLORI;  Surgeon: Darlene Earls, MD;  Location: [facility];  Service: General;  Laterality: N/A;  . Dupuytren contracture release    . Trigger finger release      x3  . Toe surgery  1970s    bone spur  . Rotator cuff repair Right 2005  . Gastric roux-en-y N/A 11/02/2012    Procedure: LAPAROSCOPIC ROUX-EN-Y GASTRIC;  Surgeon: Darlene Earls, MD;  Location: [facility];  Service: General;  Laterality: N/A;  . Bunionectomy Left  12/2013  . Portacath placement Left 08/02/2014    Procedure: INSERTION PORT-A-CATH;  Surgeon: Darlene Klein, MD;  Location: [place];  Service: General;  Laterality: Left;    FAMILY HISTORY Family History  Problem Relation Age of Onset  . CVA Mother   . Diabetes Father   . Heart failure Father   . Hypertension Sister   . Diabetes Brother   . Diabetes Paternal Uncle    the patient's father died from complications of diabetes at age 37. The patient's mother died at age 54 with a subarachnoid hemorrhage. The patient had one brother, one sister. The only breast cancer was the patient's paternal grandmother diagnosed in her 20s. There is no history of ovarian cancer in the family.  GYNECOLOGIC HISTORY:  Patient's last menstrual period was 10/22/2007.  menarche age 24, the patient is GX P0. She went through the change of life in 2011. She did not take hormone replacement.  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 Her husband Darlene [name] is a retired Visual merchandiser (he taught at [employer]).  Darlene [name] has 2 children from an earlier marriage, Darlene [name] who teaches in [place], and Darlene [name],  who works at the [employer] in in [place]. He has 1 grand son, aged 3. The patient and her husband are not church attender's    ADVANCED DIRECTIVES:  Not in place   HEALTH MAINTENANCE: Social History  Substance Use Topics  . Smoking status: Never Smoker   . Smokeless tobacco: Never Used  . Alcohol Use: 0.0 oz/week    0 Standard drinks or equivalent per week     Colonoscopy:  May 2016  PAP:  Bone density: 07/05/2013 normal, with a T score of 0.4  Lipid panel:  Allergies  Allergen Reactions  . Other Itching    Peanuts in large quantities     Current Outpatient Prescriptions  Medication Sig Dispense Refill  . acetaminophen (TYLENOL) 500 MG tablet Take 500 mg by mouth every 6 (six) hours as  needed for pain.    [name] aspirin 81 MG tablet Take 81 mg by mouth daily.      . Calcium Citrate-Vitamin D (CALCIUM CITRATE+D3 PO) Take 1,500 mg by mouth daily.    [name] dexamethasone (DECADRON) 4 MG tablet Take 2 tablets twice daily with food the day BEFORE chemo. 30 tablet 1  . diphenoxylate-atropine (LOMOTIL) 2.5-0.025 MG per tablet Take 1 tablet by mouth 4 (four) times daily as needed for diarrhea or loose stools. 30 tablet 0  . Docusate Sodium (COLACE PO) Take 1 capsule by mouth 2 (two) times daily.     [name] glucose blood (ONETOUCH VERIO) test strip 1 each by Other route as needed for other. Use as instructed    . lidocaine-prilocaine (EMLA) cream Apply to affected area once 30 g 3  . LORazepam (ATIVAN) 0.5 MG tablet Take 1 tablet (0.5 mg total) by mouth at bedtime. 30 tablet 0  . losartan (COZAAR) 100 MG tablet Take 1  tablet by mouth daily.     . mometasone (NASONEX) 50 MCG/ACT nasal spray Place 2 sprays into the nose daily as needed.      . Multiple Vitamin (MULTIVITAMIN WITH MINERALS) TABS tablet Take 1 tablet by mouth daily. With iron    . NOVOLIN 70/30 (70-30) 100 UNIT/ML injection 10 units two times a day    . ondansetron (ZOFRAN) 8 MG tablet Take 1 tablet (8 mg total) by mouth 2 (two) times daily. Start the day after chemo for 3 days. Then take as needed for nausea or vomiting. 30 tablet 1  . Probiotic Product ([employer] PROBIOTIC PO) Take 1 capsule by mouth daily.    . prochlorperazine (COMPAZINE) 10 MG tablet Take one table before meals and at bedtime (4 times a day) starint the evening of chemo and continuing for 3 full days 30 tablet 1  . tobramycin-dexamethasone (TOBRADEX) ophthalmic solution Place 1 drop into both eyes 2 (two) times daily. (Patient not taking: Reported on 08/26/2014) 5 mL 1  . Vortioxetine HBr (BRINTELLIX) 10 MG TABS Take 10 mg by mouth every morning. Patient taking 82m daily starting August 2016    . Vortioxetine HBr (BRINTELLIX) 20 MG TABS Take 20 mg by mouth daily. 90 tablet 0     No current facility-administered medications for this visit.    OBJECTIVE:  Middle-aged white woman in no acute distress Filed Vitals:   09/16/14 0846  BP: 138/59  Pulse: 66  Temp: 98.4 F (36.9 C)  Resp: 18     Body mass index is 32.28 kg/(m^2).    ECOG FS:1 - Symptomatic but completely ambulatory  Sclerae unicteric, pupils round and equal Oropharynx clear and moist-- no thrush or other lesions No cervical or supraclavicular adenopathy Lungs no rales or rhonchi Heart regular rate and rhythm Abd soft, nontender, positive bowel sounds MSK no focal spinal tenderness, no upper extremity lymphedema Neuro: nonfocal, well oriented, appropriate affect Breasts: deferred  LAB RESULTS:  CMP     Component Value Date/Time   NA 141 09/16/2014 0827   NA 140 02/18/2013 0827   K 4.8 09/16/2014 0827   K 4.3 02/18/2013 0827   CL 104 02/18/2013 0827   CO2 28 09/16/2014 0827   CO2 25 02/18/2013 0827   GLUCOSE 154* 09/16/2014 0827   GLUCOSE 168* 02/18/2013 0827   BUN 22.7 09/16/2014 0827   BUN 26* 02/18/2013 0827   CREATININE 1.2* 09/16/2014 0827   CREATININE 1.10 02/18/2013 0827   CREATININE 1.37* 11/02/2012 1230   CALCIUM 9.1 09/16/2014 0827   CALCIUM 9.5 02/18/2013 0827   PROT 5.7* 09/16/2014 0827   PROT 6.3 02/18/2013 0827   ALBUMIN 3.3* 09/16/2014 0827   ALBUMIN 3.9 02/18/2013 0827   AST 25 09/16/2014 0827   AST 23 02/18/2013 0827   ALT 38 09/16/2014 0827   ALT 36* 02/18/2013 0827   ALKPHOS 81 09/16/2014 0827   ALKPHOS 68 02/18/2013 0827   BILITOT 0.65 09/16/2014 0827   BILITOT 0.9 02/18/2013 0827   GFRNONAA 55* 02/18/2013 0827   GFRNONAA 41* 11/02/2012 1230   GFRAA 63 02/18/2013 0827   GFRAA 48* 11/02/2012 1230    INo results found for: SPEP, UPEP  Lab Results  Component Value Date   WBC 10.0 09/16/2014   NEUTROABS 7.4* 09/16/2014   HGB 10.8* 09/16/2014   HCT 31.6* 09/16/2014   MCV 92.0 09/16/2014   PLT 204 09/16/2014      Chemistry      Component  Value Date/Time  NA 141 09/16/2014 0827   NA 140 02/18/2013 0827   K 4.8 09/16/2014 0827   K 4.3 02/18/2013 0827   CL 104 02/18/2013 0827   CO2 28 09/16/2014 0827   CO2 25 02/18/2013 0827   BUN 22.7 09/16/2014 0827   BUN 26* 02/18/2013 0827   CREATININE 1.2* 09/16/2014 0827   CREATININE 1.10 02/18/2013 0827   CREATININE 1.37* 11/02/2012 1230      Component Value Date/Time   CALCIUM 9.1 09/16/2014 0827   CALCIUM 9.5 02/18/2013 0827   ALKPHOS 81 09/16/2014 0827   ALKPHOS 68 02/18/2013 0827   AST 25 09/16/2014 0827   AST 23 02/18/2013 0827   ALT 38 09/16/2014 0827   ALT 36* 02/18/2013 0827   BILITOT 0.65 09/16/2014 0827   BILITOT 0.9 02/18/2013 0827       No results found for: LABCA2  No components found for: LABCA125  No results for input(s): INR in the last 168 hours.  Urinalysis    Component Value Date/Time   BILIRUBINUR neg 06/29/2013 1017   PROTEINUR trace 06/29/2013 1017   UROBILINOGEN negative 06/29/2013 1017   NITRITE neg 06/29/2013 1017   LEUKOCYTESUR Negative 06/29/2013 1017    STUDIES: No results found.  ASSESSMENT: 64 y.o. [place] woman status post right breast and right axillary lymph node biopsy 07/19/2014 for a cT2  pN1, stage IIB  invasive ductal carcinoma, grade 2, estrogen and progesterone receptor negative, with an MIB-1 of 90%, and HER-2 positive  (1) neoadjuvant chemotherapy will consist of carboplatin, docetaxel, trastuzumab and pertuzumab given every 3 weeks 6, with onpro support, first dose 08/08/2014  (2) trastuzumab to be continued to complete one year  (a) echo 07/29/2014 shows a normal ejection fraction  (3) Definitive surgery to follow chemotherapy, with consideration of the [employer] trial  (4) adjuvant radiation to follow surgery   PLAN: The labs were reviewed in detail and were entirely stable. Darlene [name] will proceed with cycle 3 of carboplatin, docetaxel, trastuzumab, and pertuzumab as planned on Monday.  For her persistent  diarrhea, I have given her a prescription for lomotil to alternate with imodium. I offered questran powder, but she was resistant to the idea of this grainy texture.   Darlene [name] will return the week following treatment for labs and a nadir visit. She understands and agrees with this plan. She knows the goal of treatment in her case is cure. She has been encouraged to call with any issues that might arise before her next visit here.   Laurie Panda, NP   09/16/2014 9:19 AM

## 2014-09-19 ENCOUNTER — Encounter: Payer: Self-pay | Admitting: *Deleted

## 2014-09-19 ENCOUNTER — Ambulatory Visit: Payer: BLUE CROSS/BLUE SHIELD | Admitting: Nurse Practitioner

## 2014-09-19 ENCOUNTER — Other Ambulatory Visit: Payer: Self-pay | Admitting: Oncology

## 2014-09-19 ENCOUNTER — Other Ambulatory Visit: Payer: BLUE CROSS/BLUE SHIELD

## 2014-09-19 ENCOUNTER — Ambulatory Visit (HOSPITAL_BASED_OUTPATIENT_CLINIC_OR_DEPARTMENT_OTHER): Payer: BLUE CROSS/BLUE SHIELD

## 2014-09-19 ENCOUNTER — Ambulatory Visit: Payer: BLUE CROSS/BLUE SHIELD

## 2014-09-19 DIAGNOSIS — C773 Secondary and unspecified malignant neoplasm of axilla and upper limb lymph nodes: Secondary | ICD-10-CM | POA: Diagnosis not present

## 2014-09-19 DIAGNOSIS — Z5189 Encounter for other specified aftercare: Secondary | ICD-10-CM | POA: Diagnosis not present

## 2014-09-19 DIAGNOSIS — Z5111 Encounter for antineoplastic chemotherapy: Secondary | ICD-10-CM

## 2014-09-19 DIAGNOSIS — Z5112 Encounter for antineoplastic immunotherapy: Secondary | ICD-10-CM | POA: Diagnosis not present

## 2014-09-19 DIAGNOSIS — C50411 Malignant neoplasm of upper-outer quadrant of right female breast: Secondary | ICD-10-CM | POA: Diagnosis not present

## 2014-09-19 MED ORDER — ACETAMINOPHEN 325 MG PO TABS
650.0000 mg | ORAL_TABLET | Freq: Once | ORAL | Status: AC
  Administered 2014-09-19: 650 mg via ORAL

## 2014-09-19 MED ORDER — CARBOPLATIN CHEMO INJECTION 600 MG/60ML
517.0000 mg | Freq: Once | INTRAVENOUS | Status: AC
  Administered 2014-09-19: 520 mg via INTRAVENOUS
  Filled 2014-09-19: qty 52

## 2014-09-19 MED ORDER — DEXTROSE 5 % IV SOLN: 75.0000 mg/m2 | Freq: Once | INTRAVENOUS | Status: DC

## 2014-09-19 MED ORDER — TRASTUZUMAB CHEMO INJECTION 440 MG
6.0000 mg/kg | Freq: Once | INTRAVENOUS | Status: AC
  Administered 2014-09-19: 609 mg via INTRAVENOUS
  Filled 2014-09-19: qty 29

## 2014-09-19 MED ORDER — ACETAMINOPHEN 325 MG PO TABS
ORAL_TABLET | ORAL | Status: AC
  Filled 2014-09-19: qty 2

## 2014-09-19 MED ORDER — PERTUZUMAB CHEMO INJECTION 420 MG/14ML
420.0000 mg | Freq: Once | INTRAVENOUS | Status: AC
  Administered 2014-09-19: 420 mg via INTRAVENOUS
  Filled 2014-09-19: qty 14

## 2014-09-19 MED ORDER — DIPHENHYDRAMINE HCL 25 MG PO CAPS
ORAL_CAPSULE | ORAL | Status: AC
  Filled 2014-09-19: qty 1

## 2014-09-19 MED ORDER — HEPARIN SOD (PORK) LOCK FLUSH 100 UNIT/ML IV SOLN
500.0000 [IU] | Freq: Once | INTRAVENOUS | Status: AC | PRN
  Administered 2014-09-19: 500 [IU]
  Filled 2014-09-19: qty 5

## 2014-09-19 MED ORDER — DIPHENHYDRAMINE HCL 25 MG PO CAPS
25.0000 mg | ORAL_CAPSULE | Freq: Once | ORAL | Status: AC
  Administered 2014-09-19: 25 mg via ORAL

## 2014-09-19 MED ORDER — PEGFILGRASTIM 6 MG/0.6ML ~~LOC~~ PSKT
6.0000 mg | PREFILLED_SYRINGE | Freq: Once | SUBCUTANEOUS | Status: AC
  Administered 2014-09-19: 6 mg via SUBCUTANEOUS
  Filled 2014-09-19: qty 0.6

## 2014-09-19 MED ORDER — SODIUM CHLORIDE 0.9 % IV SOLN
75.0000 mg/m2 | Freq: Once | INTRAVENOUS | Status: AC
  Administered 2014-09-19: 170 mg via INTRAVENOUS
  Filled 2014-09-19: qty 17

## 2014-09-19 MED ORDER — SODIUM CHLORIDE 0.9 % IV SOLN
Freq: Once | INTRAVENOUS | Status: AC
  Administered 2014-09-19: 11:00:00 via INTRAVENOUS
  Filled 2014-09-19: qty 8

## 2014-09-19 MED ORDER — SODIUM CHLORIDE 0.9 % IJ SOLN
10.0000 mL | INTRAMUSCULAR | Status: DC | PRN
  Administered 2014-09-19: 10 mL
  Filled 2014-09-19: qty 10

## 2014-09-19 MED ORDER — SODIUM CHLORIDE 0.9 % IV SOLN
Freq: Once | INTRAVENOUS | Status: AC
  Administered 2014-09-19: 11:00:00 via INTRAVENOUS

## 2014-09-19 NOTE — Patient Instructions (Addendum)
[facility] Discharge Instructions for Patients Receiving Chemotherapy  Today you received the following chemotherapy agents: Herceptin, Perjeta, Taxotere, Carboplatin  To help prevent nausea and vomiting after your treatment, we encourage you to take your nausea medication as prescribed by your physician.   If you develop nausea and vomiting that is not controlled by your nausea medication, call the clinic.   BELOW ARE SYMPTOMS THAT SHOULD BE REPORTED IMMEDIATELY:  *FEVER GREATER THAN 100.5 F  *CHILLS WITH OR WITHOUT FEVER  NAUSEA AND VOMITING THAT IS NOT CONTROLLED WITH YOUR NAUSEA MEDICATION  *UNUSUAL SHORTNESS OF BREATH  *UNUSUAL BRUISING OR BLEEDING  TENDERNESS IN MOUTH AND THROAT WITH OR WITHOUT PRESENCE OF ULCERS  *URINARY PROBLEMS  *BOWEL PROBLEMS  UNUSUAL RASH Items with * indicate a potential emergency and should be followed up as soon as possible.  Feel free to call the clinic you have any questions or concerns. The clinic phone number is [phone].  Please show the [facility] at check-in to the Emergency Department and triage nurse.

## 2014-09-21 ENCOUNTER — Ambulatory Visit: Payer: BLUE CROSS/BLUE SHIELD

## 2014-09-27 ENCOUNTER — Other Ambulatory Visit (HOSPITAL_BASED_OUTPATIENT_CLINIC_OR_DEPARTMENT_OTHER): Payer: BLUE CROSS/BLUE SHIELD

## 2014-09-27 ENCOUNTER — Ambulatory Visit (HOSPITAL_BASED_OUTPATIENT_CLINIC_OR_DEPARTMENT_OTHER): Payer: BLUE CROSS/BLUE SHIELD | Admitting: Nurse Practitioner

## 2014-09-27 ENCOUNTER — Encounter: Payer: Self-pay | Admitting: Nurse Practitioner

## 2014-09-27 ENCOUNTER — Telehealth: Payer: Self-pay | Admitting: Nurse Practitioner

## 2014-09-27 VITALS — BP 123/50 | HR 87 | Temp 98.5°F | Resp 18 | Ht 69.0 in | Wt 213.1 lb

## 2014-09-27 DIAGNOSIS — C773 Secondary and unspecified malignant neoplasm of axilla and upper limb lymph nodes: Secondary | ICD-10-CM | POA: Diagnosis not present

## 2014-09-27 DIAGNOSIS — C50411 Malignant neoplasm of upper-outer quadrant of right female breast: Secondary | ICD-10-CM

## 2014-09-27 DIAGNOSIS — R197 Diarrhea, unspecified: Secondary | ICD-10-CM

## 2014-09-27 DIAGNOSIS — E1140 Type 2 diabetes mellitus with diabetic neuropathy, unspecified: Secondary | ICD-10-CM

## 2014-09-27 DIAGNOSIS — Z171 Estrogen receptor negative status [ER-]: Secondary | ICD-10-CM | POA: Diagnosis not present

## 2014-09-27 DIAGNOSIS — E860 Dehydration: Secondary | ICD-10-CM

## 2014-09-27 DIAGNOSIS — G620 Drug-induced polyneuropathy: Secondary | ICD-10-CM | POA: Insufficient documentation

## 2014-09-27 DIAGNOSIS — T451X5A Adverse effect of antineoplastic and immunosuppressive drugs, initial encounter: Secondary | ICD-10-CM

## 2014-09-27 LAB — COMPREHENSIVE METABOLIC PANEL (CC13)
ALT: 29 U/L (ref 0–55)
AST: 33 U/L (ref 5–34)
Albumin: 3.3 g/dL — ABNORMAL LOW (ref 3.5–5.0)
Alkaline Phosphatase: 99 U/L (ref 40–150)
Anion Gap: 6 mEq/L (ref 3–11)
BUN: 27.2 mg/dL — ABNORMAL HIGH (ref 7.0–26.0)
CO2: 24 mEq/L (ref 22–29)
Calcium: 9.2 mg/dL (ref 8.4–10.4)
Chloride: 108 mEq/L (ref 98–109)
Creatinine: 1.5 mg/dL — ABNORMAL HIGH (ref 0.6–1.1)
EGFR: 38 mL/min/{1.73_m2} — ABNORMAL LOW (ref >90)
Glucose: 155 mg/dl — ABNORMAL HIGH (ref 70–140)
Potassium: 4.5 mEq/L (ref 3.5–5.1)
Sodium: 139 mEq/L (ref 136–145)
Total Bilirubin: 0.5 mg/dL (ref 0.20–1.20)
Total Protein: 5.8 g/dL — ABNORMAL LOW (ref 6.4–8.3)

## 2014-09-27 LAB — CBC WITH DIFFERENTIAL/PLATELET
BASO%: 0.3 % (ref 0.0–2.0)
Basophils Absolute: 0 10*3/uL (ref 0.0–0.1)
EOS%: 0.3 % (ref 0.0–7.0)
Eosinophils Absolute: 0.1 10*3/uL (ref 0.0–0.5)
HCT: 28.7 % — ABNORMAL LOW (ref 34.8–46.6)
HGB: 9.8 g/dL — ABNORMAL LOW (ref 11.6–15.9)
LYMPH%: 15.9 % (ref 14.0–49.7)
MCH: 31.4 pg (ref 25.1–34.0)
MCHC: 34.2 g/dL (ref 31.5–36.0)
MCV: 91.9 fL (ref 79.5–101.0)
MONO#: 1.9 10*3/uL — ABNORMAL HIGH (ref 0.1–0.9)
MONO%: 12.2 % (ref 0.0–14.0)
NEUT#: 11.1 10*3/uL — ABNORMAL HIGH (ref 1.5–6.5)
NEUT%: 71.3 % (ref 38.4–76.8)
Platelets: 193 10*3/uL (ref 145–400)
RBC: 3.13 10*6/uL — ABNORMAL LOW (ref 3.70–5.45)
RDW: 15.1 % — ABNORMAL HIGH (ref 11.2–14.5)
WBC: 15.6 10*3/uL — ABNORMAL HIGH (ref 3.9–10.3)
lymph#: 2.5 10*3/uL (ref 0.9–3.3)

## 2014-09-27 MED ORDER — CHOLESTYRAMINE 4 G PO PACK: 4.0000 g | PACK | Freq: Two times a day (BID) | ORAL | Status: DC

## 2014-09-27 NOTE — Telephone Encounter (Signed)
Gave avs & calendar for September thru November.

## 2014-09-27 NOTE — Progress Notes (Signed)
[place]  [phone] [phone]) [phone]     ID: KENZLEE FISHBURN DOB: 11-16-1950  MR#: [id number]  YDX#:[id number]  Patient Care Team: Thressa Sheller, MD as PCP - General (Internal Medicine) Bryson Ha Himmelrich, RD as Dietitian (Bariatrics) Stark Klein, MD as Consulting Physician (General Surgery) Chauncey Cruel, MD as Consulting Physician (Oncology) Arloa Koh, MD as Consulting Physician (Radiation Oncology) Mauro Kaufmann, RN as Registered Nurse Rockwell Germany, RN as Registered Nurse Jacelyn Pi, MD as Consulting Physician (Endocrinology) PCP: Thressa Sheller, MD GYN: Kem Boroughs FNP OTHER MD:  CHIEF COMPLAINT:  HER-2 positive , estrogen receptor negative breast cancer  CURRENT TREATMENT:  Neoadjuvant chemotherapy with anti-HER-2 treatment   BREAST CANCER HISTORY: From the original intake note:  "Darlene [name]" had screening mammography at her gynecologist suggestive of a possible abnormality in the right breast. However right diagnostic mammogram 04/12/2013 at [facility] was negative. More recently however, she noted a lump in her right breast and brought it to her gynecologist's attention. On 07/18/2014 the patient had bilateral diagnostic mammography with tomosynthesis at [facility]. The breast density was category B. In the right breast upper outer quadrant there was an area of focal asymmetry with indistinct margins.[name] Ultrasound was obtained and showed in addition to the mass in question, measuring 1.7 cm, an abnormal-appearing lymph node in the right axillary tail.  On 07/19/2014 the patient underwent biopsy of the right breast massin question as well as a right axillary lymph node. Both were positive for invasive ductal carcinoma, grade 2, estrogen and progesterone receptor negative, with an MIB-1 of 90%, and HER-2 pending. Incidentally the lymph node biopsy showed a lymphocytic inflammatory component but no lymph node architecture.  Her subsequent  history is as detailed below.  INTERVAL HISTORY:  Darlene [name] returns today for follow-up of her breast cancer, accompanied by her husband. Today is day 9, cycle 3 of carboplatin, docetaxel, trastuzumab and pertuzumab given every 3 weeks 6, with neulasta given on day 2.   REVIEW OF SYSTEMS: Darlene [name] denies fevers or chills. She manages her nausea well with antiemetics. She has been alternating lomotil and imodium but is still having several episodes of diarrhea daily. She drinks 1 quart of water daily, but she believes she loses most of it to her stools. Her appetite is fair. She denies mouth sores or rashes. Her baseline neuropathy from diabetes has progressed from her toes to the balls of her feet. It fees like she is wearing socks even when she isn't. A detailed review of systems is otherwise stable.  PAST MEDICAL HISTORY: Past Medical History  Diagnosis Date  . Obesity   . Hypertension   . Hyperlipidemia   . Obstructive sleep apnea on CPAP   . Diabetes mellitus without complication     72+ years  . Eating disorder     binge eating  . Balance problem   . Arthritis     in fingers, shoulders  . Anemia     hx of  . Depression   . Ovarian cyst rupture     possible  . Clumsiness   . Breast cancer of upper-outer quadrant of right female breast 07/22/2014  . Breast cancer   . Anxiety   . Neuromuscular disorder     PAST SURGICAL HISTORY: Past Surgical History  Procedure Laterality Date  . Carpal tunnel release Right   . Carpal tunnel release Left   . Breath tek h pylori N/A 09/15/2012    Procedure: BREATH TEK H PYLORI;  Surgeon:  Pedro Earls, MD;  Location: [facility];  Service: General;  Laterality: N/A;  . Dupuytren contracture release    . Trigger finger release      x3  . Toe surgery  1970s    bone spur  . Rotator cuff repair Right 2005  . Gastric roux-en-y N/A 11/02/2012    Procedure: LAPAROSCOPIC ROUX-EN-Y GASTRIC;  Surgeon: Pedro Earls, MD;  Location: [facility];  Service:  General;  Laterality: N/A;  . Bunionectomy Left 12/2013  . Portacath placement Left 08/02/2014    Procedure: INSERTION PORT-A-CATH;  Surgeon: Stark Klein, MD;  Location: [facility];  Service: General;  Laterality: Left;    FAMILY HISTORY Family History  Problem Relation Age of Onset  . CVA Mother   . Diabetes Father   . Heart failure Father   . Hypertension Sister   . Diabetes Brother   . Diabetes Paternal Uncle    the patient's father died from complications of diabetes at age 12. The patient's mother died at age 41 with a subarachnoid hemorrhage. The patient had one brother, one sister. The only breast cancer was the patient's paternal grandmother diagnosed in her 16s. There is no history of ovarian cancer in the family.  GYNECOLOGIC HISTORY:  Patient's last menstrual period was 10/22/2007.  menarche age 55, the patient is GX P0. She went through the change of life in 2011. She did not take hormone replacement.  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 Her husband Darlene [name] is a retired Visual merchandiser (he taught at [employer]).  Darlene [name] has 2 children from an earlier marriage, Rockey Situ who teaches in [place], and Abriana Saltos,  who works at the [employer] in in [place]. He has 1 grand son, aged 36. The patient and her husband are not church attender's    ADVANCED DIRECTIVES:  Not in place   HEALTH MAINTENANCE: Social History  Substance Use Topics  . Smoking status: Never Smoker   . Smokeless tobacco: Never Used  . Alcohol Use: 0.0 oz/week    0 Standard drinks or equivalent per week     Colonoscopy:  May 2016  PAP:  Bone density: 07/05/2013 normal, with a T score of 0.4  Lipid panel:  Allergies  Allergen Reactions  . Other Itching    Peanuts in large quantities     Current Outpatient Prescriptions  Medication Sig Dispense Refill  . aspirin 81 MG tablet Take 81 mg by  mouth daily.      . Calcium Citrate-Vitamin D (CALCIUM CITRATE+D3 PO) Take 1,500 mg by mouth daily.    [name] dexamethasone (DECADRON) 4 MG tablet Take 2 tablets twice daily with food the day BEFORE chemo. 30 tablet 1  . diphenoxylate-atropine (LOMOTIL) 2.5-0.025 MG per tablet Take 1 tablet by mouth 4 (four) times daily as needed for diarrhea or loose stools. 30 tablet 0  . glucose blood (ONETOUCH VERIO) test strip 1 each by Other route as needed for other. Use as instructed    . lidocaine-prilocaine (EMLA) cream Apply to affected area once 30 g 3  . LORazepam (ATIVAN) 0.5 MG tablet Take 1 tablet (0.5 mg total) by mouth at bedtime. 30 tablet 0  . losartan (COZAAR) 100 MG tablet Take 1 tablet by mouth daily.     . Multiple Vitamin (MULTIVITAMIN WITH MINERALS) TABS tablet Take 1 tablet by mouth 2 (two) times daily. With iron    .  NOVOLIN 70/30 (70-30) 100 UNIT/ML injection 10 units two times a day    . ondansetron (ZOFRAN) 8 MG tablet Take 1 tablet (8 mg total) by mouth 2 (two) times daily. Start the day after chemo for 3 days. Then take as needed for nausea or vomiting. 30 tablet 1  . Probiotic Product ([employer] PROBIOTIC PO) Take 1 capsule by mouth daily.    . prochlorperazine (COMPAZINE) 10 MG tablet Take one table before meals and at bedtime (4 times a day) starint the evening of chemo and continuing for 3 full days 30 tablet 1  . tobramycin-dexamethasone (TOBRADEX) ophthalmic solution Place 1 drop into both eyes 2 (two) times daily. 5 mL 1  . Vortioxetine HBr (BRINTELLIX) 20 MG TABS Take 20 mg by mouth daily. 90 tablet 0  . Vortioxetine HBr (BRINTELLIX) 20 MG TABS Take 20 mg by mouth daily.    [name] acetaminophen (TYLENOL) 500 MG tablet Take 500 mg by mouth every 6 (six) hours as needed for pain.    . cholestyramine (QUESTRAN) 4 G packet Take 1 packet (4 g total) by mouth 2 (two) times daily. 60 each 1  . Docusate Sodium (COLACE PO) Take 1 capsule by mouth 2 (two) times daily.     . mometasone (NASONEX) 50  MCG/ACT nasal spray Place 2 sprays into the nose daily as needed.       No current facility-administered medications for this visit.    OBJECTIVE:  Middle-aged white woman in no acute distress Filed Vitals:   09/27/14 1344  BP: 123/50  Pulse: 87  Temp: 98.5 F (36.9 C)  Resp: 18     Body mass index is 31.46 kg/(m^2).    ECOG FS:1 - Symptomatic but completely ambulatory  Skin: warm, dry  HEENT: sclerae anicteric, conjunctivae pink, oropharynx clear. No thrush or mucositis.  Lymph Nodes: No cervical or supraclavicular lymphadenopathy  Lungs: clear to auscultation bilaterally, no rales, wheezes, or rhonci  Heart: regular rate and rhythm  Abdomen: round, soft, non tender, positive bowel sounds  Musculoskeletal: No focal spinal tenderness, no peripheral edema  Neuro: non focal, well oriented, positive affect  Breasts: deferred  LAB RESULTS:  CMP     Component Value Date/Time   NA 139 09/27/2014 1318   NA 140 02/18/2013 0827   K 4.5 09/27/2014 1318   K 4.3 02/18/2013 0827   CL 104 02/18/2013 0827   CO2 24 09/27/2014 1318   CO2 25 02/18/2013 0827   GLUCOSE 155* 09/27/2014 1318   GLUCOSE 168* 02/18/2013 0827   BUN 27.2* 09/27/2014 1318   BUN 26* 02/18/2013 0827   CREATININE 1.5* 09/27/2014 1318   CREATININE 1.10 02/18/2013 0827   CREATININE 1.37* 11/02/2012 1230   CALCIUM 9.2 09/27/2014 1318   CALCIUM 9.5 02/18/2013 0827   PROT 5.8* 09/27/2014 1318   PROT 6.3 02/18/2013 0827   ALBUMIN 3.3* 09/27/2014 1318   ALBUMIN 3.9 02/18/2013 0827   AST 33 09/27/2014 1318   AST 23 02/18/2013 0827   ALT 29 09/27/2014 1318   ALT 36* 02/18/2013 0827   ALKPHOS 99 09/27/2014 1318   ALKPHOS 68 02/18/2013 0827   BILITOT 0.50 09/27/2014 1318   BILITOT 0.9 02/18/2013 0827   GFRNONAA 55* 02/18/2013 0827   GFRNONAA 41* 11/02/2012 1230   GFRAA 63 02/18/2013 0827   GFRAA 48* 11/02/2012 1230    INo results found for: SPEP, UPEP  Lab Results  Component Value Date   WBC 15.6*  09/27/2014   NEUTROABS 11.1* 09/27/2014  HGB 9.8* 09/27/2014   HCT 28.7* 09/27/2014   MCV 91.9 09/27/2014   PLT 193 09/27/2014      Chemistry      Component Value Date/Time   NA 139 09/27/2014 1318   NA 140 02/18/2013 0827   K 4.5 09/27/2014 1318   K 4.3 02/18/2013 0827   CL 104 02/18/2013 0827   CO2 24 09/27/2014 1318   CO2 25 02/18/2013 0827   BUN 27.2* 09/27/2014 1318   BUN 26* 02/18/2013 0827   CREATININE 1.5* 09/27/2014 1318   CREATININE 1.10 02/18/2013 0827   CREATININE 1.37* 11/02/2012 1230      Component Value Date/Time   CALCIUM 9.2 09/27/2014 1318   CALCIUM 9.5 02/18/2013 0827   ALKPHOS 99 09/27/2014 1318   ALKPHOS 68 02/18/2013 0827   AST 33 09/27/2014 1318   AST 23 02/18/2013 0827   ALT 29 09/27/2014 1318   ALT 36* 02/18/2013 0827   BILITOT 0.50 09/27/2014 1318   BILITOT 0.9 02/18/2013 0827       No results found for: LABCA2  No components found for: [id number]  No results for input(s): INR in the last 168 hours.  Urinalysis    Component Value Date/Time   BILIRUBINUR neg 06/29/2013 1017   PROTEINUR trace 06/29/2013 1017   UROBILINOGEN negative 06/29/2013 1017   NITRITE neg 06/29/2013 1017   LEUKOCYTESUR Negative 06/29/2013 1017    STUDIES: No results found.  ASSESSMENT: 64 y.o.  woman status post right breast and right axillary lymph node biopsy 07/19/2014 for a cT2  pN1, stage IIB  invasive ductal carcinoma, grade 2, estrogen and progesterone receptor negative, with an MIB-1 of 90%, and HER-2 positive  (1) neoadjuvant chemotherapy will consist of carboplatin, docetaxel, trastuzumab and pertuzumab given every 3 weeks 6, with onpro support, first dose 08/08/2014  (2) trastuzumab to be continued to complete one year  (a) echo 07/29/2014 shows a normal ejection fraction  (3) Definitive surgery to follow chemotherapy, with consideration of the [facility] trial  (4) adjuvant radiation to follow surgery   PLAN: The labs were reviewed  in detail and her creatinine and BUN are elevated when compared to last week. She has not been drinking well. She has a history of a need for extra fluids because of her history of gastric bypass surgery, and I fear the diarrhea she has had this week has made her dehydrated. I offered IV fluids, and she agreed, but does not want to get them until next week (Monday). For her persistent diarrhea, I am adding questran powder BID to her imodium and lomotil rotation.   She is likely experiencing neuropathy from the docetaxel, which is complicated by her preexisting diabetic neuropathy. Dr. Jana Hakim will review this symptom with her prior to the start of cycle 4, and may need to make adjustments to this particular medication.   Darlene [name] will return 2 weeks for cycle 4 of treatment. Prior to visit she will have a repeat breast MRI. She understands and agrees with this plan. She knows the goal of treatment in her case is cure. She has been encouraged to call with any issues that might arise before her next visit here.   Laurie Panda, NP   09/27/2014 3:18 PM

## 2014-09-29 ENCOUNTER — Encounter: Payer: Self-pay | Admitting: *Deleted

## 2014-10-03 ENCOUNTER — Ambulatory Visit (HOSPITAL_BASED_OUTPATIENT_CLINIC_OR_DEPARTMENT_OTHER): Payer: BLUE CROSS/BLUE SHIELD

## 2014-10-03 DIAGNOSIS — E860 Dehydration: Secondary | ICD-10-CM

## 2014-10-03 DIAGNOSIS — C50411 Malignant neoplasm of upper-outer quadrant of right female breast: Secondary | ICD-10-CM

## 2014-10-03 MED ORDER — SODIUM CHLORIDE 0.9 % IV SOLN
Freq: Once | INTRAVENOUS | Status: AC
  Administered 2014-10-03: 15:00:00 via INTRAVENOUS
  Filled 2014-10-03: qty 4

## 2014-10-03 MED ORDER — SODIUM CHLORIDE 0.9 % IV SOLN
Freq: Once | INTRAVENOUS | Status: AC
  Administered 2014-10-03: 15:00:00 via INTRAVENOUS

## 2014-10-03 MED ORDER — HEPARIN SOD (PORK) LOCK FLUSH 100 UNIT/ML IV SOLN
500.0000 [IU] | Freq: Once | INTRAVENOUS | Status: AC | PRN
  Administered 2014-10-03: 500 [IU]
  Filled 2014-10-03: qty 5

## 2014-10-03 MED ORDER — SODIUM CHLORIDE 0.9 % IJ SOLN
10.0000 mL | INTRAMUSCULAR | Status: DC | PRN
  Administered 2014-10-03: 10 mL
  Filled 2014-10-03: qty 10

## 2014-10-03 NOTE — Patient Instructions (Signed)
Dehydration, Adult Dehydration is when you lose more fluids from the body than you take in. Vital organs like the kidneys, brain, and heart cannot function without a proper amount of fluids and salt. Any loss of fluids from the body can cause dehydration.  CAUSES   Vomiting.  Diarrhea.  Excessive sweating.  Excessive urine output.  Fever. SYMPTOMS  Mild dehydration  Thirst.  Dry lips.  Slightly dry mouth. Moderate dehydration  Very dry mouth.  Sunken eyes.  Skin does not bounce back quickly when lightly pinched and released.  Dark urine and decreased urine production.  Decreased tear production.  Headache. Severe dehydration  Very dry mouth.  Extreme thirst.  Rapid, weak pulse (more than 100 beats per minute at rest).  Cold hands and feet.  Not able to sweat in spite of heat and temperature.  Rapid breathing.  Blue lips.  Confusion and lethargy.  Difficulty being awakened.  Minimal urine production.  No tears. DIAGNOSIS  Your caregiver will diagnose dehydration based on your symptoms and your exam. Blood and urine tests will help confirm the diagnosis. The diagnostic evaluation should also identify the cause of dehydration. TREATMENT  Treatment of mild or moderate dehydration can often be done at home by increasing the amount of fluids that you drink. It is best to drink small amounts of fluid more often. Drinking too much at one time can make vomiting worse. Refer to the home care instructions below. Severe dehydration needs to be treated at the hospital where you will probably be given intravenous (IV) fluids that contain water and electrolytes. HOME CARE INSTRUCTIONS   Ask your caregiver about specific rehydration instructions.  Drink enough fluids to keep your urine clear or pale yellow.  Drink small amounts frequently if you have nausea and vomiting.  Eat as you normally do.  Avoid:  Foods or drinks high in sugar.  Carbonated  drinks.  Juice.  Extremely hot or cold fluids.  Drinks with caffeine.  Fatty, greasy foods.  Alcohol.  Tobacco.  Overeating.  Gelatin desserts.  Wash your hands well to avoid spreading bacteria and viruses.  Only take over-the-counter or prescription medicines for pain, discomfort, or fever as directed by your caregiver.  Ask your caregiver if you should continue all prescribed and over-the-counter medicines.  Keep all follow-up appointments with your caregiver. SEEK MEDICAL CARE IF:  You have abdominal pain and it increases or stays in one area (localizes).  You have a rash, stiff neck, or severe headache.  You are irritable, sleepy, or difficult to awaken.  You are weak, dizzy, or extremely thirsty. SEEK IMMEDIATE MEDICAL CARE IF:   You are unable to keep fluids down or you get worse despite treatment.  You have frequent episodes of vomiting or diarrhea.  You have blood or green matter (bile) in your vomit.  You have blood in your stool or your stool looks black and tarry.  You have not urinated in 6 to 8 hours, or you have only urinated a small amount of very dark urine.  You have a fever.  You faint. MAKE SURE YOU:   Understand these instructions.  Will watch your condition.  Will get help right away if you are not doing well or get worse. Document Released: 01/07/2005 Document Revised: 04/01/2011 Document Reviewed: 08/27/2010 ExitCare Patient Information 2015 [employer]. This information is not intended to replace advice given to you by your health care provider. Make sure you discuss any questions you have with your health care   provider.  

## 2014-10-05 ENCOUNTER — Other Ambulatory Visit: Payer: Self-pay | Admitting: *Deleted

## 2014-10-05 ENCOUNTER — Encounter: Payer: Self-pay | Admitting: Nurse Practitioner

## 2014-10-05 ENCOUNTER — Telehealth: Payer: Self-pay | Admitting: *Deleted

## 2014-10-05 DIAGNOSIS — C50411 Malignant neoplasm of upper-outer quadrant of right female breast: Secondary | ICD-10-CM

## 2014-10-05 MED ORDER — PROCHLORPERAZINE MALEATE 10 MG PO TABS: ORAL_TABLET | ORAL | Status: DC

## 2014-10-05 MED ORDER — ONDANSETRON HCL 8 MG PO TABS: 8.0000 mg | ORAL_TABLET | Freq: Two times a day (BID) | ORAL | Status: DC

## 2014-10-05 NOTE — Telephone Encounter (Signed)
Returned pt's phone call concerning nausea and vomiting. Pt said, " last night after supper I started feeling queasy. I thought maybe it was something I ate, then by morning I was dry heaving. I went to work and threw up at work, left and came home. When I got home I threw up 3 more times, tried to drink water but failed to keep it down. Since then I've been sipping on low calorie Gatorade." Pt had one episode of diarrhea earlier, no more since then. Pt has ran out of both nausea medication. I have called Zofran and Compazine to her pharmacy. Pt said she got IV fluids earlier in the week and would prefer not to come back if she can get the emesis under control. Pt has had no other symptoms as far as chills, fever and pain. Pt verbalized understanding. No further concerns. I told pt if after taking the nausea medication she  continues to have any vomiting to call us back @ [phone]. Message to be fwd to Dr. Jana Hakim.

## 2014-10-06 ENCOUNTER — Ambulatory Visit
Admission: RE | Admit: 2014-10-06 | Discharge: 2014-10-06 | Disposition: A | Discharge status: Home / Self Care | Payer: BLUE CROSS/BLUE SHIELD | Source: Ambulatory Visit | Attending: Oncology | Admitting: Oncology

## 2014-10-06 DIAGNOSIS — C50411 Malignant neoplasm of upper-outer quadrant of right female breast: Secondary | ICD-10-CM

## 2014-10-06 MED ORDER — GADOBENATE DIMEGLUMINE 529 MG/ML IV SOLN
10.0000 mL | Freq: Once | INTRAVENOUS | Status: AC | PRN
  Administered 2014-10-06: 10 mL via INTRAVENOUS

## 2014-10-07 ENCOUNTER — Other Ambulatory Visit: Payer: Self-pay | Admitting: *Deleted

## 2014-10-09 NOTE — Progress Notes (Signed)
[place]  [phone] Fax:([phone]) [phone]     ID: Darlene [name] DOB: [birth year]  MR#: [id number]  TGG#:[id number]  Patient Care Team: Thressa Sheller, MD as PCP - General (Internal Medicine) Bryson Ha Himmelrich, RD as Dietitian (Bariatrics) Stark Klein, MD as Consulting Physician (General Surgery) Chauncey Cruel, MD as Consulting Physician (Oncology) Arloa Koh, MD as Consulting Physician (Radiation Oncology) Mauro Kaufmann, RN as Registered Nurse Rockwell Germany, RN as Registered Nurse Jacelyn Pi, MD as Consulting Physician (Endocrinology) PCP: Thressa Sheller, MD GYN: Kem Boroughs FNP OTHER MD:  CHIEF COMPLAINT:  HER-2 positive , estrogen receptor negative breast cancer  CURRENT TREATMENT:  Neoadjuvant chemotherapy with anti-HER-2 treatment   BREAST CANCER HISTORY: From the original intake note:  "Darlene [name]" had screening mammography at her gynecologist suggestive of a possible abnormality in the right breast. However right diagnostic mammogram 04/12/2013 at [facility] was negative. More recently however, she noted a lump in her right breast and brought it to her gynecologist's attention. On 07/18/2014 the patient had bilateral diagnostic mammography with tomosynthesis at [facility]. The breast density was category B. In the right breast upper outer quadrant there was an area of focal asymmetry with indistinct margins.[name] Ultrasound was obtained and showed in addition to the mass in question, measuring 1.7 cm, an abnormal-appearing lymph node in the right axillary tail.  On 07/19/2014 the patient underwent biopsy of the right breast massin question as well as a right axillary lymph node. Both were positive for invasive ductal carcinoma, grade 2, estrogen and progesterone receptor negative, with an MIB-1 of 90%, and HER-2 pending. Incidentally the lymph node biopsy showed a lymphocytic inflammatory component but no lymph node architecture.  Her subsequent  history is as detailed below.  INTERVAL HISTORY:  Darlene [name] returns today for follow-up of her breast cancer, accompanied by her husband Darlene [name]. Today is day 1, cycle 4 of six planned cycles of of carboplatin, docetaxel, trastuzumab and pertuzumab given every 3 weeks, with neulasta given on day 2. Since the last visit here she had a repeat MRI of the breast to assess initial response. The tumor is responding wonderfully, but more in a "Swiss cheese" pattern. This means the total area that likely will need to be removed is about the same, although the volume of tumor will be much less. We reviewed those images today  REVIEW OF SYSTEMS: Darlene [name] has some fatigue and some minimal eye changes, but that the problem she continues to have his diarrhea. This is clearly related to the pertuzumab. She has Imodium, Lomotil, and Questran, but she has not been using these aggressively and off. She also has had trouble with rehydration and has required IV fluids at least twice in the recent past. She does have some nausea which interferes with both her taking the antimotility agents and the fluids. Aside from these issues a detailed review of systems today was noncontributory .  PAST MEDICAL HISTORY: Past Medical History  Diagnosis Date  . Obesity   . Hypertension   . Hyperlipidemia   . Obstructive sleep apnea on CPAP   . Diabetes mellitus without complication     62+ years  . Eating disorder     binge eating  . Balance problem   . Arthritis     in fingers, shoulders  . Anemia     hx of  . Depression   . Ovarian cyst rupture     possible  . Clumsiness   . Breast cancer of upper-outer quadrant of  right female breast 07/22/2014  . Breast cancer   . Anxiety   . Neuromuscular disorder     PAST SURGICAL HISTORY: Past Surgical History  Procedure Laterality Date  . Carpal tunnel release Right   . Carpal tunnel release Left   . Breath tek h pylori N/A 09/15/2012    Procedure: BREATH TEK H PYLORI;  Surgeon:  Pedro Earls, MD;  Location: [facility];  Service: General;  Laterality: N/A;  . Dupuytren contracture release    . Trigger finger release      x3  . Toe surgery  1970s    bone spur  . Rotator cuff repair Right 2005  . Gastric roux-en-y N/A 11/02/2012    Procedure: LAPAROSCOPIC ROUX-EN-Y GASTRIC;  Surgeon: Pedro Earls, MD;  Location: [facility];  Service: General;  Laterality: N/A;  . Bunionectomy Left 12/2013  . Portacath placement Left 08/02/2014    Procedure: INSERTION PORT-A-CATH;  Surgeon: Stark Klein, MD;  Location: [place];  Service: General;  Laterality: Left;    FAMILY HISTORY Family History  Problem Relation Age of Onset  . CVA Mother   . Diabetes Father   . Heart failure Father   . Hypertension Sister   . Diabetes Brother   . Diabetes Paternal Uncle    the patient's father died from complications of diabetes at age 72. The patient's mother died at age 3 with a subarachnoid hemorrhage. The patient had one brother, one sister. The only breast cancer was the patient's paternal grandmother diagnosed in her 104s. There is no history of ovarian cancer in the family.  GYNECOLOGIC HISTORY:  Patient's last menstrual period was 10/22/2007.  menarche age 64, the patient is GX P0. She went through the change of life in 64. She did not take hormone replacement.  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 Her husband Darlene [name] is a retired Visual merchandiser (he taught at [employer]).  Darlene [name] has 2 children from an earlier marriage, Darlene [name] who teaches in [place], and Darlene [name],  who works at the [employer] in in [place]. He has 1 grand son, aged 15. The patient and her husband are not church attender's    ADVANCED DIRECTIVES:  Not in place   HEALTH MAINTENANCE: Social History  Substance Use Topics  . Smoking status: Never Smoker   . Smokeless tobacco: Never Used  .  Alcohol Use: 0.0 oz/week    0 Standard drinks or equivalent per week     Colonoscopy:  May 2016  PAP:  Bone density: 07/05/2013 normal, with a T score of 0.4  Lipid panel:  Allergies  Allergen Reactions  . Other Itching    Peanuts in large quantities     Current Outpatient Prescriptions  Medication Sig Dispense Refill  . acetaminophen (TYLENOL) 500 MG tablet Take 500 mg by mouth every 6 (six) hours as needed for pain.    [name] aspirin 81 MG tablet Take 81 mg by mouth daily.      . Calcium Citrate-Vitamin D (CALCIUM CITRATE+D3 PO) Take 1,500 mg by mouth daily.    . cholestyramine (QUESTRAN) 4 G packet Take 1 packet (4 g total) by mouth 2 (two) times daily. 60 each 1  . dexamethasone (DECADRON) 4 MG tablet Take 2 tablets twice daily with food the day BEFORE chemo. 30 tablet 1  . diphenoxylate-atropine (LOMOTIL) 2.5-0.025 MG per tablet Take 1 tablet by mouth 4 (four)  times daily as needed for diarrhea or loose stools. 30 tablet 0  . Docusate Sodium (COLACE PO) Take 1 capsule by mouth 2 (two) times daily.     [name] glucose blood (ONETOUCH VERIO) test strip 1 each by Other route as needed for other. Use as instructed    . lidocaine-prilocaine (EMLA) cream Apply to affected area once 30 g 3  . LORazepam (ATIVAN) 0.5 MG tablet Take 1 tablet (0.5 mg total) by mouth at bedtime. 30 tablet 0  . losartan (COZAAR) 100 MG tablet Take 1 tablet by mouth daily.     . mometasone (NASONEX) 50 MCG/ACT nasal spray Place 2 sprays into the nose daily as needed.      . Multiple Vitamin (MULTIVITAMIN WITH MINERALS) TABS tablet Take 1 tablet by mouth 2 (two) times daily. With iron    . NOVOLIN 70/30 (70-30) 100 UNIT/ML injection 10 units two times a day    . ondansetron (ZOFRAN) 8 MG tablet Take 1 tablet (8 mg total) by mouth 2 (two) times daily. Start the day after chemo for 3 days. Then take as needed for nausea or vomiting. 30 tablet 1  . Probiotic Product ([employer] PROBIOTIC PO) Take 1 capsule by mouth daily.    .  prochlorperazine (COMPAZINE) 10 MG tablet Take one table before meals and at bedtime (4 times a day) starint the evening of chemo and continuing for 3 full days 30 tablet 1  . tobramycin-dexamethasone (TOBRADEX) ophthalmic solution Place 1 drop into both eyes 2 (two) times daily. 5 mL 1  . Vortioxetine HBr (BRINTELLIX) 20 MG TABS Take 20 mg by mouth daily. 90 tablet 0   No current facility-administered medications for this visit.    OBJECTIVE:  Middle-aged white woman who appears stated age 64 Vitals:   10/10/14 0822  BP: 137/57  Pulse: 83  Temp: 98 F (36.7 C)  Resp: 18     Body mass index is 32.77 kg/(m^2).    ECOG FS:1 - Symptomatic but completely ambulatory  Sclerae unicteric, pupils round and equal Oropharynx clear and moist-- no thrush or other lesions No cervical or supraclavicular adenopathy Lungs no rales or rhonchi Heart regular rate and rhythm Abd soft, obese, nontender, positive bowel sounds MSK no focal spinal tenderness, no upper extremity lymphedema Neuro: nonfocal, well oriented, appropriate affect Breasts: The mass in the right breast is still palpable, possibly slightly softer. There is no erythema associated with this. The right axilla is benign. The left breast is unremarkable    LAB RESULTS:  CMP     Component Value Date/Time   NA 140 10/10/2014 0809   NA 140 02/18/2013 0827   K 4.8 10/10/2014 0809   K 4.3 02/18/2013 0827   CL 104 02/18/2013 0827   CO2 24 10/10/2014 0809   CO2 25 02/18/2013 0827   GLUCOSE 245* 10/10/2014 0809   GLUCOSE 168* 02/18/2013 0827   BUN 16.5 10/10/2014 0809   BUN 26* 02/18/2013 0827   CREATININE 1.1 10/10/2014 0809   CREATININE 1.10 02/18/2013 0827   CREATININE 1.37* 11/02/2012 1230   CALCIUM 8.9 10/10/2014 0809   CALCIUM 9.5 02/18/2013 0827   PROT 5.8* 10/10/2014 0809   PROT 6.3 02/18/2013 0827   ALBUMIN 3.4* 10/10/2014 0809   ALBUMIN 3.9 02/18/2013 0827   AST 26 10/10/2014 0809   AST 23 02/18/2013 0827   ALT  39 10/10/2014 0809   ALT 36* 02/18/2013 0827   ALKPHOS 87 10/10/2014 0809   ALKPHOS 68 02/18/2013 0827  BILITOT 0.72 10/10/2014 0809   BILITOT 0.9 02/18/2013 0827   GFRNONAA 55* 02/18/2013 0827   GFRNONAA 41* 11/02/2012 1230   GFRAA 63 02/18/2013 0827   GFRAA 48* 11/02/2012 1230    INo results found for: SPEP, UPEP  Lab Results  Component Value Date   WBC 11.3* 10/10/2014   NEUTROABS 10.2* 10/10/2014   HGB 10.2* 10/10/2014   HCT 31.0* 10/10/2014   MCV 95.1 10/10/2014   PLT 203 10/10/2014      Chemistry      Component Value Date/Time   NA 140 10/10/2014 0809   NA 140 02/18/2013 0827   K 4.8 10/10/2014 0809   K 4.3 02/18/2013 0827   CL 104 02/18/2013 0827   CO2 24 10/10/2014 0809   CO2 25 02/18/2013 0827   BUN 16.5 10/10/2014 0809   BUN 26* 02/18/2013 0827   CREATININE 1.1 10/10/2014 0809   CREATININE 1.10 02/18/2013 0827   CREATININE 1.37* 11/02/2012 1230      Component Value Date/Time   CALCIUM 8.9 10/10/2014 0809   CALCIUM 9.5 02/18/2013 0827   ALKPHOS 87 10/10/2014 0809   ALKPHOS 68 02/18/2013 0827   AST 26 10/10/2014 0809   AST 23 02/18/2013 0827   ALT 39 10/10/2014 0809   ALT 36* 02/18/2013 0827   BILITOT 0.72 10/10/2014 0809   BILITOT 0.9 02/18/2013 0827       No results found for: LABCA2  No components found for: [id number]  No results for input(s): INR in the last 168 hours.  Urinalysis    Component Value Date/Time   BILIRUBINUR neg 06/29/2013 1017   PROTEINUR trace 06/29/2013 1017   UROBILINOGEN negative 06/29/2013 1017   NITRITE neg 06/29/2013 1017   LEUKOCYTESUR Negative 06/29/2013 1017    STUDIES: Mr Breast Bilateral W Wo Contrast  10/06/2014   CLINICAL DATA:  64 year old female with a right breast invasive ductal carcinoma and positive right axillary lymph node, which was biopsied 07/19/2014. The patient is being treated with neoadjuvant chemotherapy prior to definitive surgery and radiation therapy. The mass was palpable on initial  presentation.  LABS:  The most recent laboratory values were obtained on 09/27/2014, with creatinine of 1.5 mg/dL and GFR of 38.  EXAM: BILATERAL BREAST MRI WITH AND WITHOUT CONTRAST  TECHNIQUE: Multiplanar, multisequence MR images of both breasts were obtained prior to and following the intravenous administration of 10 ml of MultiHance.  THREE-DIMENSIONAL MR IMAGE RENDERING ON INDEPENDENT WORKSTATION:  Three-dimensional MR images were rendered by post-processing of the original MR data on an independent workstation. The three-dimensional MR images were interpreted, and findings are reported in the following complete MRI report for this study. Three dimensional images were evaluated at the independent DynaCad workstation  COMPARISON:  Breast MRI 08/01/2014 as well as prior mammograms and ultrasound.  FINDINGS: Breast composition: b. Scattered fibroglandular tissue.  Background parenchymal enhancement: Mild  Right breast: There is marked decrease enhancement and confluence of disease in the upper-outer quadrant of the right breast. The remaining area of enhancement is significantly less masslike then seen on the prior exam. The residual clumped non mass enhancement as measured on the MIP images measures 8.1 x 3.8 x 4.0 cm, previously measuring 9.9 x 7.8 x 9.0 cm remeasured in similar plane (this remeasured area incorporates the mass, non mass enhancement and the scattered satellite nodules described on the previous report, as there are currently no discrete borders for measurement. Susceptibility artifact is again seen in the posterior-lateral aspect of this non mass enhancement,  compatible with biopsy marking clip. No definite skin, nipple or pectoral muscle enhancement.  Left breast: No mass or abnormal enhancement.  Note that evaluation of the medial aspect of the bilateral breasts on the postcontrast subtracted images is limited due to poor fat saturation, similar to the prior study, likely due to proximity of  pendulous breasts to the coil.  Lymph nodes: Interval decrease in size of the previously noted abnormal lymph nodes in the right axilla. The largest as measured on the T1 precontrast images measures 1.2 cm in long axis, previously measuring 2.0 cm.  Ancillary findings: Interval placement of a chest port in the medial left upper chest.  IMPRESSION: Marked interval reduction in confluence and size of the enhancement in the upper-outer quadrant of the right breast status post initiation of neoadjuvant chemotherapy.  RECOMMENDATION: Continue treatment plan.  BI-RADS CATEGORY  BI-RADS 6: Known malignancy.   Electronically Signed   By: Ammie Ferrier M.D.   On: 10/06/2014 10:30    ASSESSMENT: 64 y.o. [place] woman status post right breast and right axillary lymph node biopsy 07/19/2014 for a cT2  pN1, stage IIB  invasive ductal carcinoma, grade 2, estrogen and progesterone receptor negative, with an MIB-1 of 90%, and HER-2 amplified  (1) neoadjuvant chemotherapy started 08/08/2014, consisting of carboplatin, docetaxel, trastuzumab and pertuzumab given every 3 weeks 6  (a) breast MRI after initial 3 cycles shows a significant response  (2) trastuzumab to be continued to complete one year  (a) echo 07/29/2014 shows a normal ejection fraction  (3) Definitive surgery to follow chemotherapy, with consideration of the Alliance trial  (4) adjuvant radiation to follow surgery   PLAN: Darlene [name] is halfway through her neoadjuvant chemotherapy and is having a good response. She understands the tumor is shrinking in a "Swiss cheese" fashion and this has implications for her ultimate surgery. I think it would be a good idea for her to meet with Dr. Barry Dienes soon and start planning what she would like to do.  Today Darlene [name] wanted to know if she should have both her breast removed. We again discussed the fact that this does not improve survival. She may wish to do it for other reasons, such as cosmetic symmetry or to  avoid another breast cancer in the future. If she takes antiestrogen the risk of another breast cancer developing in either breast would be a half percent per year. She will be taking all this into account as she makes her surgical decision.  I urged her to be very aggressive with the antidiarrhea agents. She should take Lomotil with her first diarrheal stool, then 2 Imodium with the next, and continue alternating as needed. She also should take the Questran twice a day. I do think if she does this as she will get good diarrheal control. I also urged her to hydrate herself aggressively.  Nevertheless I am setting her up for intravenous fluids days 2 and 3 of each cycle.  If despite all this we don't make headway, we will omit the per Jetta from the fifth cycle, and then consider reinstating it for the last cycle.  Darlene [name] has a good understanding of this plan. She agrees with it. She will call with any problems that may develop before her next visit here. Chauncey Cruel, MD   10/10/2014 8:49 AM

## 2014-10-10 ENCOUNTER — Ambulatory Visit (HOSPITAL_BASED_OUTPATIENT_CLINIC_OR_DEPARTMENT_OTHER): Payer: BLUE CROSS/BLUE SHIELD

## 2014-10-10 ENCOUNTER — Other Ambulatory Visit (HOSPITAL_BASED_OUTPATIENT_CLINIC_OR_DEPARTMENT_OTHER): Payer: BLUE CROSS/BLUE SHIELD

## 2014-10-10 ENCOUNTER — Ambulatory Visit (HOSPITAL_BASED_OUTPATIENT_CLINIC_OR_DEPARTMENT_OTHER): Payer: BLUE CROSS/BLUE SHIELD | Admitting: Oncology

## 2014-10-10 ENCOUNTER — Telehealth: Payer: Self-pay | Admitting: Oncology

## 2014-10-10 ENCOUNTER — Encounter: Payer: Self-pay | Admitting: *Deleted

## 2014-10-10 VITALS — BP 137/57 | HR 83 | Temp 98.0°F | Resp 18 | Ht 69.0 in | Wt 222.0 lb

## 2014-10-10 DIAGNOSIS — Z171 Estrogen receptor negative status [ER-]: Secondary | ICD-10-CM | POA: Diagnosis not present

## 2014-10-10 DIAGNOSIS — Z5112 Encounter for antineoplastic immunotherapy: Secondary | ICD-10-CM

## 2014-10-10 DIAGNOSIS — C50411 Malignant neoplasm of upper-outer quadrant of right female breast: Secondary | ICD-10-CM

## 2014-10-10 DIAGNOSIS — Z5111 Encounter for antineoplastic chemotherapy: Secondary | ICD-10-CM

## 2014-10-10 DIAGNOSIS — C773 Secondary and unspecified malignant neoplasm of axilla and upper limb lymph nodes: Secondary | ICD-10-CM | POA: Diagnosis not present

## 2014-10-10 DIAGNOSIS — Z5189 Encounter for other specified aftercare: Secondary | ICD-10-CM | POA: Diagnosis not present

## 2014-10-10 LAB — CBC WITH DIFFERENTIAL/PLATELET
BASO%: 0.1 % (ref 0.0–2.0)
Basophils Absolute: 0 10*3/uL (ref 0.0–0.1)
EOS%: 0 % (ref 0.0–7.0)
Eosinophils Absolute: 0 10*3/uL (ref 0.0–0.5)
HCT: 31 % — ABNORMAL LOW (ref 34.8–46.6)
HGB: 10.2 g/dL — ABNORMAL LOW (ref 11.6–15.9)
LYMPH%: 7.4 % — ABNORMAL LOW (ref 14.0–49.7)
MCH: 31.3 pg (ref 25.1–34.0)
MCHC: 32.9 g/dL (ref 31.5–36.0)
MCV: 95.1 fL (ref 79.5–101.0)
MONO#: 0.2 10*3/uL (ref 0.1–0.9)
MONO%: 1.9 % (ref 0.0–14.0)
NEUT#: 10.2 10*3/uL — ABNORMAL HIGH (ref 1.5–6.5)
NEUT%: 90.6 % — ABNORMAL HIGH (ref 38.4–76.8)
Platelets: 203 10*3/uL (ref 145–400)
RBC: 3.26 10*6/uL — ABNORMAL LOW (ref 3.70–5.45)
RDW: 16.3 % — ABNORMAL HIGH (ref 11.2–14.5)
WBC: 11.3 10*3/uL — ABNORMAL HIGH (ref 3.9–10.3)
lymph#: 0.8 10*3/uL — ABNORMAL LOW (ref 0.9–3.3)

## 2014-10-10 LAB — COMPREHENSIVE METABOLIC PANEL (CC13)
ALT: 39 U/L (ref 0–55)
AST: 26 U/L (ref 5–34)
Albumin: 3.4 g/dL — ABNORMAL LOW (ref 3.5–5.0)
Alkaline Phosphatase: 87 U/L (ref 40–150)
Anion Gap: 8 mEq/L (ref 3–11)
BUN: 16.5 mg/dL (ref 7.0–26.0)
CO2: 24 mEq/L (ref 22–29)
Calcium: 8.9 mg/dL (ref 8.4–10.4)
Chloride: 108 mEq/L (ref 98–109)
Creatinine: 1.1 mg/dL (ref 0.6–1.1)
EGFR: 57 mL/min/{1.73_m2} — ABNORMAL LOW (ref >90)
Glucose: 245 mg/dl — ABNORMAL HIGH (ref 70–140)
Potassium: 4.8 mEq/L (ref 3.5–5.1)
Sodium: 140 mEq/L (ref 136–145)
Total Bilirubin: 0.72 mg/dL (ref 0.20–1.20)
Total Protein: 5.8 g/dL — ABNORMAL LOW (ref 6.4–8.3)

## 2014-10-10 MED ORDER — SODIUM CHLORIDE 0.9 % IV SOLN
547.5000 mg | Freq: Once | INTRAVENOUS | Status: AC
  Administered 2014-10-10: 550 mg via INTRAVENOUS
  Filled 2014-10-10: qty 55

## 2014-10-10 MED ORDER — DIPHENHYDRAMINE HCL 25 MG PO CAPS
25.0000 mg | ORAL_CAPSULE | Freq: Once | ORAL | Status: AC
  Administered 2014-10-10: 25 mg via ORAL

## 2014-10-10 MED ORDER — SODIUM CHLORIDE 0.9 % IV SOLN
420.0000 mg | Freq: Once | INTRAVENOUS | Status: AC
  Administered 2014-10-10: 420 mg via INTRAVENOUS
  Filled 2014-10-10: qty 14

## 2014-10-10 MED ORDER — PEGFILGRASTIM 6 MG/0.6ML ~~LOC~~ PSKT
6.0000 mg | PREFILLED_SYRINGE | Freq: Once | SUBCUTANEOUS | Status: AC
  Administered 2014-10-10: 6 mg via SUBCUTANEOUS
  Filled 2014-10-10: qty 0.6

## 2014-10-10 MED ORDER — SODIUM CHLORIDE 0.9 % IV SOLN
Freq: Once | INTRAVENOUS | Status: AC
  Administered 2014-10-10: 09:00:00 via INTRAVENOUS

## 2014-10-10 MED ORDER — ACETAMINOPHEN 325 MG PO TABS
ORAL_TABLET | ORAL | Status: AC
  Filled 2014-10-10: qty 2

## 2014-10-10 MED ORDER — ACETAMINOPHEN 325 MG PO TABS
650.0000 mg | ORAL_TABLET | Freq: Once | ORAL | Status: AC
  Administered 2014-10-10: 650 mg via ORAL

## 2014-10-10 MED ORDER — SODIUM CHLORIDE 0.9 % IV SOLN
75.0000 mg/m2 | Freq: Once | INTRAVENOUS | Status: AC
  Administered 2014-10-10: 170 mg via INTRAVENOUS
  Filled 2014-10-10: qty 17

## 2014-10-10 MED ORDER — TRASTUZUMAB CHEMO INJECTION 440 MG
6.0000 mg/kg | Freq: Once | INTRAVENOUS | Status: AC
  Administered 2014-10-10: 609 mg via INTRAVENOUS
  Filled 2014-10-10: qty 29

## 2014-10-10 MED ORDER — SODIUM CHLORIDE 0.9 % IJ SOLN
10.0000 mL | INTRAMUSCULAR | Status: DC | PRN
  Administered 2014-10-10: 10 mL
  Filled 2014-10-10: qty 10

## 2014-10-10 MED ORDER — HEPARIN SOD (PORK) LOCK FLUSH 100 UNIT/ML IV SOLN
500.0000 [IU] | Freq: Once | INTRAVENOUS | Status: AC | PRN
  Administered 2014-10-10: 500 [IU]
  Filled 2014-10-10: qty 5

## 2014-10-10 MED ORDER — SODIUM CHLORIDE 0.9 % IV SOLN
Freq: Once | INTRAVENOUS | Status: AC
  Administered 2014-10-10: 12:00:00 via INTRAVENOUS
  Filled 2014-10-10: qty 8

## 2014-10-10 MED ORDER — DIPHENHYDRAMINE HCL 25 MG PO CAPS
ORAL_CAPSULE | ORAL | Status: AC
  Filled 2014-10-10: qty 1

## 2014-10-10 NOTE — Telephone Encounter (Signed)
Appointments made and avs printed for patient °

## 2014-10-11 ENCOUNTER — Ambulatory Visit (HOSPITAL_BASED_OUTPATIENT_CLINIC_OR_DEPARTMENT_OTHER): Payer: BLUE CROSS/BLUE SHIELD

## 2014-10-11 VITALS — BP 140/52 | HR 64 | Temp 97.8°F | Resp 17

## 2014-10-11 DIAGNOSIS — C50411 Malignant neoplasm of upper-outer quadrant of right female breast: Secondary | ICD-10-CM

## 2014-10-11 MED ORDER — SODIUM CHLORIDE 0.9 % IV SOLN
Freq: Once | INTRAVENOUS | Status: AC
  Administered 2014-10-11: 09:00:00 via INTRAVENOUS

## 2014-10-11 MED ORDER — SODIUM CHLORIDE 0.9 % IJ SOLN
10.0000 mL | INTRAMUSCULAR | Status: DC | PRN
  Administered 2014-10-11: 10 mL
  Filled 2014-10-11: qty 10

## 2014-10-11 MED ORDER — SODIUM CHLORIDE 0.9 % IV SOLN
Freq: Once | INTRAVENOUS | Status: AC
  Administered 2014-10-11: 09:00:00 via INTRAVENOUS
  Filled 2014-10-11: qty 4

## 2014-10-11 MED ORDER — HEPARIN SOD (PORK) LOCK FLUSH 100 UNIT/ML IV SOLN
500.0000 [IU] | Freq: Once | INTRAVENOUS | Status: AC | PRN
  Administered 2014-10-11: 500 [IU]
  Filled 2014-10-11: qty 5

## 2014-10-11 NOTE — Patient Instructions (Signed)
Dehydration, Adult Dehydration is when you lose more fluids from the body than you take in. Vital organs like the kidneys, brain, and heart cannot function without a proper amount of fluids and salt. Any loss of fluids from the body can cause dehydration.  CAUSES   Vomiting.  Diarrhea.  Excessive sweating.  Excessive urine output.  Fever. SYMPTOMS  Mild dehydration  Thirst.  Dry lips.  Slightly dry mouth. Moderate dehydration  Very dry mouth.  Sunken eyes.  Skin does not bounce back quickly when lightly pinched and released.  Dark urine and decreased urine production.  Decreased tear production.  Headache. Severe dehydration  Very dry mouth.  Extreme thirst.  Rapid, weak pulse (more than 100 beats per minute at rest).  Cold hands and feet.  Not able to sweat in spite of heat and temperature.  Rapid breathing.  Blue lips.  Confusion and lethargy.  Difficulty being awakened.  Minimal urine production.  No tears. DIAGNOSIS  Your caregiver will diagnose dehydration based on your symptoms and your exam. Blood and urine tests will help confirm the diagnosis. The diagnostic evaluation should also identify the cause of dehydration. TREATMENT  Treatment of mild or moderate dehydration can often be done at home by increasing the amount of fluids that you drink. It is best to drink small amounts of fluid more often. Drinking too much at one time can make vomiting worse. Refer to the home care instructions below. Severe dehydration needs to be treated at the hospital where you will probably be given intravenous (IV) fluids that contain water and electrolytes. HOME CARE INSTRUCTIONS   Ask your caregiver about specific rehydration instructions.  Drink enough fluids to keep your urine clear or pale yellow.  Drink small amounts frequently if you have nausea and vomiting.  Eat as you normally do.  Avoid:  Foods or drinks high in sugar.  Carbonated  drinks.  Juice.  Extremely hot or cold fluids.  Drinks with caffeine.  Fatty, greasy foods.  Alcohol.  Tobacco.  Overeating.  Gelatin desserts.  Wash your hands well to avoid spreading bacteria and viruses.  Only take over-the-counter or prescription medicines for pain, discomfort, or fever as directed by your caregiver.  Ask your caregiver if you should continue all prescribed and over-the-counter medicines.  Keep all follow-up appointments with your caregiver. SEEK MEDICAL CARE IF:  You have abdominal pain and it increases or stays in one area (localizes).  You have a rash, stiff neck, or severe headache.  You are irritable, sleepy, or difficult to awaken.  You are weak, dizzy, or extremely thirsty. SEEK IMMEDIATE MEDICAL CARE IF:   You are unable to keep fluids down or you get worse despite treatment.  You have frequent episodes of vomiting or diarrhea.  You have blood or green matter (bile) in your vomit.  You have blood in your stool or your stool looks black and tarry.  You have not urinated in 6 to 8 hours, or you have only urinated a small amount of very dark urine.  You have a fever.  You faint. MAKE SURE YOU:   Understand these instructions.  Will watch your condition.  Will get help right away if you are not doing well or get worse. Document Released: 01/07/2005 Document Revised: 04/01/2011 Document Reviewed: 08/27/2010 ExitCare Patient Information 2015 [employer]. This information is not intended to replace advice given to you by your health care provider. Make sure you discuss any questions you have with your health care   provider.  

## 2014-10-12 ENCOUNTER — Ambulatory Visit: Payer: BLUE CROSS/BLUE SHIELD

## 2014-10-12 VITALS — Temp 98.4°F

## 2014-10-12 DIAGNOSIS — C50411 Malignant neoplasm of upper-outer quadrant of right female breast: Secondary | ICD-10-CM

## 2014-10-12 MED ORDER — SODIUM CHLORIDE 0.9 % IJ SOLN
10.0000 mL | INTRAMUSCULAR | Status: DC | PRN
  Filled 2014-10-12: qty 10

## 2014-10-12 MED ORDER — HEPARIN SOD (PORK) LOCK FLUSH 100 UNIT/ML IV SOLN
250.0000 [IU] | Freq: Once | INTRAVENOUS | Status: DC | PRN
  Filled 2014-10-12: qty 5

## 2014-10-12 MED ORDER — SODIUM CHLORIDE 0.9 % IV SOLN: Freq: Once | INTRAVENOUS | Status: DC

## 2014-10-12 MED ORDER — SODIUM CHLORIDE 0.9 % IV SOLN
Freq: Once | INTRAVENOUS | Status: DC
  Filled 2014-10-12: qty 4

## 2014-10-12 MED ORDER — ALTEPLASE 2 MG IJ SOLR
2.0000 mg | Freq: Once | INTRAMUSCULAR | Status: DC | PRN
  Filled 2014-10-12: qty 2

## 2014-10-12 MED ORDER — HEPARIN SOD (PORK) LOCK FLUSH 100 UNIT/ML IV SOLN
500.0000 [IU] | Freq: Once | INTRAVENOUS | Status: DC | PRN
  Filled 2014-10-12: qty 5

## 2014-10-17 ENCOUNTER — Other Ambulatory Visit: Payer: Self-pay | Admitting: Oncology

## 2014-10-17 ENCOUNTER — Other Ambulatory Visit (HOSPITAL_BASED_OUTPATIENT_CLINIC_OR_DEPARTMENT_OTHER): Payer: BLUE CROSS/BLUE SHIELD

## 2014-10-17 ENCOUNTER — Ambulatory Visit (HOSPITAL_BASED_OUTPATIENT_CLINIC_OR_DEPARTMENT_OTHER): Payer: BLUE CROSS/BLUE SHIELD | Admitting: Nurse Practitioner

## 2014-10-17 ENCOUNTER — Encounter: Payer: Self-pay | Admitting: Nurse Practitioner

## 2014-10-17 VITALS — BP 128/46 | HR 86 | Temp 98.2°F | Resp 18 | Ht 69.0 in | Wt 218.2 lb

## 2014-10-17 DIAGNOSIS — R197 Diarrhea, unspecified: Secondary | ICD-10-CM | POA: Diagnosis not present

## 2014-10-17 DIAGNOSIS — C50411 Malignant neoplasm of upper-outer quadrant of right female breast: Secondary | ICD-10-CM

## 2014-10-17 DIAGNOSIS — E860 Dehydration: Secondary | ICD-10-CM | POA: Diagnosis not present

## 2014-10-17 LAB — CBC WITH DIFFERENTIAL/PLATELET
BASO%: 0.5 % (ref 0.0–2.0)
Basophils Absolute: 0 10*3/uL (ref 0.0–0.1)
EOS%: 0.7 % (ref 0.0–7.0)
Eosinophils Absolute: 0.1 10*3/uL (ref 0.0–0.5)
HCT: 27 % — ABNORMAL LOW (ref 34.8–46.6)
HGB: 9 g/dL — ABNORMAL LOW (ref 11.6–15.9)
LYMPH%: 17.9 % (ref 14.0–49.7)
MCH: 31.4 pg (ref 25.1–34.0)
MCHC: 33.2 g/dL (ref 31.5–36.0)
MCV: 94.8 fL (ref 79.5–101.0)
MONO#: 1.4 10*3/uL — ABNORMAL HIGH (ref 0.1–0.9)
MONO%: 15.3 % — ABNORMAL HIGH (ref 0.0–14.0)
NEUT#: 6.1 10*3/uL (ref 1.5–6.5)
NEUT%: 65.6 % (ref 38.4–76.8)
Platelets: 178 10*3/uL (ref 145–400)
RBC: 2.85 10*6/uL — ABNORMAL LOW (ref 3.70–5.45)
RDW: 16.2 % — ABNORMAL HIGH (ref 11.2–14.5)
WBC: 9.3 10*3/uL (ref 3.9–10.3)
lymph#: 1.7 10*3/uL (ref 0.9–3.3)

## 2014-10-17 LAB — COMPREHENSIVE METABOLIC PANEL (CC13)
ALT: 24 U/L (ref 0–55)
AST: 23 U/L (ref 5–34)
Albumin: 3.2 g/dL — ABNORMAL LOW (ref 3.5–5.0)
Alkaline Phosphatase: 98 U/L (ref 40–150)
Anion Gap: 6 mEq/L (ref 3–11)
BUN: 20.6 mg/dL (ref 7.0–26.0)
CO2: 27 mEq/L (ref 22–29)
Calcium: 8.7 mg/dL (ref 8.4–10.4)
Chloride: 108 mEq/L (ref 98–109)
Creatinine: 1.2 mg/dL — ABNORMAL HIGH (ref 0.6–1.1)
EGFR: 47 mL/min/{1.73_m2} — ABNORMAL LOW (ref >90)
Glucose: 239 mg/dl — ABNORMAL HIGH (ref 70–140)
Potassium: 4.8 mEq/L (ref 3.5–5.1)
Sodium: 141 mEq/L (ref 136–145)
Total Bilirubin: 0.62 mg/dL (ref 0.20–1.20)
Total Protein: 5.6 g/dL — ABNORMAL LOW (ref 6.4–8.3)

## 2014-10-17 MED ORDER — DIPHENOXYLATE-ATROPINE 2.5-0.025 MG PO TABS: 1.0000 | ORAL_TABLET | Freq: Four times a day (QID) | ORAL | Status: DC | PRN

## 2014-10-17 NOTE — Progress Notes (Signed)
[place]  [phone] [phone]     ID: Darlene [name] DOB: [birth year]  MR#: [id number]  WNI#:[id number]  Patient Care Team: Thressa Sheller, MD as PCP - General (Internal Medicine) Bryson Ha Himmelrich, RD as Dietitian (Bariatrics) Stark Klein, MD as Consulting Physician (General Surgery) Chauncey Cruel, MD as Consulting Physician (Oncology) Arloa Koh, MD as Consulting Physician (Radiation Oncology) Mauro Kaufmann, RN as Registered Nurse Rockwell Germany, RN as Registered Nurse Jacelyn Pi, MD as Consulting Physician (Endocrinology) PCP: Thressa Sheller, MD GYN: Kem Boroughs FNP OTHER MD:  CHIEF COMPLAINT:  HER-2 positive , estrogen receptor negative breast cancer  CURRENT TREATMENT:  Neoadjuvant chemotherapy with anti-HER-2 treatment   BREAST CANCER HISTORY: From the original intake note:  "Darlene [name]" had screening mammography at her gynecologist suggestive of a possible abnormality in the right breast. However right diagnostic mammogram 04/12/2013 at [facility] was negative. More recently however, she noted a lump in her right breast and brought it to her gynecologist's attention. On 07/18/2014 the patient had bilateral diagnostic mammography with tomosynthesis at [facility]. The breast density was category B. In the right breast upper outer quadrant there was an area of focal asymmetry with indistinct margins.[name] Ultrasound was obtained and showed in addition to the mass in question, measuring 1.7 cm, an abnormal-appearing lymph node in the right axillary tail.  On 07/19/2014 the patient underwent biopsy of the right breast massin question as well as a right axillary lymph node. Both were positive for invasive ductal carcinoma, grade 2, estrogen and progesterone receptor negative, with an MIB-1 of 90%, and HER-2 pending. Incidentally the lymph node biopsy showed a lymphocytic inflammatory component but no lymph node architecture.  Her subsequent  history is as detailed below.  INTERVAL HISTORY:  Darlene [name] returns today for follow-up of her breast cancer, accompanied by her husband Darlene [name]. Today is day 8, cycle 4 of six planned cycles of of carboplatin, docetaxel, trastuzumab and pertuzumab given every 3 weeks, with neulasta given on day 2.  REVIEW OF SYSTEMS: Darlene [name] denies fevers or chills. Her nausea is managed with compazine. She is having less diarrhea when she is taking her antidiarrheal agents as often as she is prescribed. She denies mouth sores or rashes. The numbness to her bilateral feet is no worse than previously described. She is fatigued, and slept away an entire day last week, having to call out from work to do so. She has some pain after neulasta, managed with tylenol. The IV fluids given last week for rehydration were helpful. She met with her psychiatrist recently, and she will continue on the 12m of brintellix daily, though she still has fairly depressive episodes some days. A detailed review of systems is otherwise stable.  PAST MEDICAL HISTORY: Past Medical History  Diagnosis Date  . Obesity   . Hypertension   . Hyperlipidemia   . Obstructive sleep apnea on CPAP   . Diabetes mellitus without complication     238+years  . Eating disorder     binge eating  . Balance problem   . Arthritis     in fingers, shoulders  . Anemia     hx of  . Depression   . Ovarian cyst rupture     possible  . Clumsiness   . Breast cancer of upper-outer quadrant of right female breast 07/22/2014  . Breast cancer   . Anxiety   . Neuromuscular disorder     PAST SURGICAL HISTORY: Past Surgical History  Procedure Laterality Date  .  Carpal tunnel release Right   . Carpal tunnel release Left   . Breath tek h pylori N/A 09/15/2012    Procedure: BREATH TEK H PYLORI;  Surgeon: Pedro Earls, MD;  Location: [facility];  Service: General;  Laterality: N/A;  . Dupuytren contracture release    . Trigger finger release      x3  . Toe  surgery  1970s    bone spur  . Rotator cuff repair Right 2005  . Gastric roux-en-y N/A 11/02/2012    Procedure: LAPAROSCOPIC ROUX-EN-Y GASTRIC;  Surgeon: Pedro Earls, MD;  Location: [facility];  Service: General;  Laterality: N/A;  . Bunionectomy Left 12/2013  . Portacath placement Left 08/02/2014    Procedure: INSERTION PORT-A-CATH;  Surgeon: Stark Klein, MD;  Location: [facility];  Service: General;  Laterality: Left;    FAMILY HISTORY Family History  Problem Relation Age of Onset  . CVA Mother   . Diabetes Father   . Heart failure Father   . Hypertension Sister   . Diabetes Brother   . Diabetes Paternal Uncle    the patient's father died from complications of diabetes at age 66. The patient's mother died at age 65 with a subarachnoid hemorrhage. The patient had one brother, one sister. The only breast cancer was the patient's paternal grandmother diagnosed in her 67s. There is no history of ovarian cancer in the family.  GYNECOLOGIC HISTORY:  Patient's last menstrual period was 10/22/2007.  menarche age 89, the patient is GX P0. She went through the change of life in 2011. She did not take hormone replacement.  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 Her husband Darlene [name] is a retired Visual merchandiser (he taught at [employer]).  Darlene [name] has 2 children from an earlier marriage, Darlene [name] who teaches in [place], and Darlene [name],  who works at the [employer] in in [place]. He has 1 grand son, aged 64. The patient and her husband are not church attender's    ADVANCED DIRECTIVES:  Not in place   HEALTH MAINTENANCE: Social History  Substance Use Topics  . Smoking status: Never Smoker   . Smokeless tobacco: Never Used  . Alcohol Use: 0.0 oz/week    0 Standard drinks or equivalent per week     Colonoscopy:  May 2016  PAP:  Bone density: 07/05/2013 normal, with a T score of  0.4  Lipid panel:  Allergies  Allergen Reactions  . Other Itching    Peanuts in large quantities     Current Outpatient Prescriptions  Medication Sig Dispense Refill  . acetaminophen (TYLENOL) 500 MG tablet Take 500 mg by mouth every 6 (six) hours as needed for pain.    [name] aspirin 81 MG tablet Take 81 mg by mouth daily.      . Calcium Citrate-Vitamin D (CALCIUM CITRATE+D3 PO) Take 1,500 mg by mouth daily.    . cholestyramine (QUESTRAN) 4 G packet Take 1 packet (4 g total) by mouth 2 (two) times daily. 60 each 1  . dexamethasone (DECADRON) 4 MG tablet Take 2 tablets twice daily with food the day BEFORE chemo. 30 tablet 1  . diphenoxylate-atropine (LOMOTIL) 2.5-0.025 MG per tablet Take 1 tablet by mouth 4 (four) times daily as needed for diarrhea or loose stools. 60 tablet 1  . Docusate Sodium (COLACE PO) Take 1 capsule by mouth 2 (two) times daily.     [name] glucose  blood (ONETOUCH VERIO) test strip 1 each by Other route as needed for other. Use as instructed    . lidocaine-prilocaine (EMLA) cream Apply to affected area once 30 g 3  . LORazepam (ATIVAN) 0.5 MG tablet Take 1 tablet (0.5 mg total) by mouth at bedtime. 30 tablet 0  . losartan (COZAAR) 100 MG tablet Take 1 tablet by mouth daily.     . mometasone (NASONEX) 50 MCG/ACT nasal spray Place 2 sprays into the nose daily as needed.      . Multiple Vitamin (MULTIVITAMIN WITH MINERALS) TABS tablet Take 1 tablet by mouth 2 (two) times daily. With iron    . NOVOLIN 70/30 (70-30) 100 UNIT/ML injection 10 units two times a day    . ondansetron (ZOFRAN) 8 MG tablet Take 1 tablet (8 mg total) by mouth 2 (two) times daily. Start the day after chemo for 3 days. Then take as needed for nausea or vomiting. 30 tablet 1  . Probiotic Product ([employer] PROBIOTIC PO) Take 1 capsule by mouth daily.    . prochlorperazine (COMPAZINE) 10 MG tablet Take one table before meals and at bedtime (4 times a day) starint the evening of chemo and continuing for 3 full days 30  tablet 1  . tobramycin-dexamethasone (TOBRADEX) ophthalmic solution Place 1 drop into both eyes 2 (two) times daily. 5 mL 1  . Vortioxetine HBr (BRINTELLIX) 20 MG TABS Take 20 mg by mouth daily. 90 tablet 0   No current facility-administered medications for this visit.    OBJECTIVE:  Middle-aged white woman who appears stated age 89 Vitals:   10/17/14 1411  BP: 128/46  Pulse: 86  Temp: 98.2 F (36.8 C)  Resp: 18     Body mass index is 32.21 kg/(m^2).    ECOG FS:1 - Symptomatic but completely ambulatory  Skin: warm, dry  HEENT: sclerae anicteric, conjunctivae pink, oropharynx clear. No thrush or mucositis.  Lymph Nodes: No cervical or supraclavicular lymphadenopathy  Lungs: clear to auscultation bilaterally, no rales, wheezes, or rhonci  Heart: regular rate and rhythm  Abdomen: round, soft, non tender, positive bowel sounds  Musculoskeletal: No focal spinal tenderness, no peripheral edema  Neuro: non focal, well oriented, positive affect  Breasts: deferred  LAB RESULTS:  CMP     Component Value Date/Time   NA 141 10/17/2014 1358   NA 140 02/18/2013 0827   K 4.8 10/17/2014 1358   K 4.3 02/18/2013 0827   CL 104 02/18/2013 0827   CO2 27 10/17/2014 1358   CO2 25 02/18/2013 0827   GLUCOSE 239* 10/17/2014 1358   GLUCOSE 168* 02/18/2013 0827   BUN 20.6 10/17/2014 1358   BUN 26* 02/18/2013 0827   CREATININE 1.2* 10/17/2014 1358   CREATININE 1.10 02/18/2013 0827   CREATININE 1.37* 11/02/2012 1230   CALCIUM 8.7 10/17/2014 1358   CALCIUM 9.5 02/18/2013 0827   PROT 5.6* 10/17/2014 1358   PROT 6.3 02/18/2013 0827   ALBUMIN 3.2* 10/17/2014 1358   ALBUMIN 3.9 02/18/2013 0827   AST 23 10/17/2014 1358   AST 23 02/18/2013 0827   ALT 24 10/17/2014 1358   ALT 36* 02/18/2013 0827   ALKPHOS 98 10/17/2014 1358   ALKPHOS 68 02/18/2013 0827   BILITOT 0.62 10/17/2014 1358   BILITOT 0.9 02/18/2013 0827   GFRNONAA 55* 02/18/2013 0827   GFRNONAA 41* 11/02/2012 1230   GFRAA 63  02/18/2013 0827   GFRAA 48* 11/02/2012 1230    INo results found for: SPEP, UPEP  Lab Results  Component Value Date   WBC 9.3 10/17/2014   NEUTROABS 6.1 10/17/2014   HGB 9.0* 10/17/2014   HCT 27.0* 10/17/2014   MCV 94.8 10/17/2014   PLT 178 10/17/2014      Chemistry      Component Value Date/Time   NA 141 10/17/2014 1358   NA 140 02/18/2013 0827   K 4.8 10/17/2014 1358   K 4.3 02/18/2013 0827   CL 104 02/18/2013 0827   CO2 27 10/17/2014 1358   CO2 25 02/18/2013 0827   BUN 20.6 10/17/2014 1358   BUN 26* 02/18/2013 0827   CREATININE 1.2* 10/17/2014 1358   CREATININE 1.10 02/18/2013 0827   CREATININE 1.37* 11/02/2012 1230      Component Value Date/Time   CALCIUM 8.7 10/17/2014 1358   CALCIUM 9.5 02/18/2013 0827   ALKPHOS 98 10/17/2014 1358   ALKPHOS 68 02/18/2013 0827   AST 23 10/17/2014 1358   AST 23 02/18/2013 0827   ALT 24 10/17/2014 1358   ALT 36* 02/18/2013 0827   BILITOT 0.62 10/17/2014 1358   BILITOT 0.9 02/18/2013 0827       No results found for: LABCA2  No components found for: [id number]  No results for input(s): INR in the last 168 hours.  Urinalysis    Component Value Date/Time   BILIRUBINUR neg 06/29/2013 1017   PROTEINUR trace 06/29/2013 1017   UROBILINOGEN negative 06/29/2013 1017   NITRITE neg 06/29/2013 1017   LEUKOCYTESUR Negative 06/29/2013 1017    STUDIES: Mr Breast Bilateral W Wo Contrast  10/06/2014   CLINICAL DATA:  64 year old female with a right breast invasive ductal carcinoma and positive right axillary lymph node, which was biopsied 07/19/2014. The patient is being treated with neoadjuvant chemotherapy prior to definitive surgery and radiation therapy. The mass was palpable on initial presentation.  LABS:  The most recent laboratory values were obtained on 09/27/2014, with creatinine of 1.5 mg/dL and GFR of 38.  EXAM: BILATERAL BREAST MRI WITH AND WITHOUT CONTRAST  TECHNIQUE: Multiplanar, multisequence MR images of both breasts  were obtained prior to and following the intravenous administration of 10 ml of MultiHance.  THREE-DIMENSIONAL MR IMAGE RENDERING ON INDEPENDENT WORKSTATION:  Three-dimensional MR images were rendered by post-processing of the original MR data on an independent workstation. The three-dimensional MR images were interpreted, and findings are reported in the following complete MRI report for this study. Three dimensional images were evaluated at the independent DynaCad workstation  COMPARISON:  Breast MRI 08/01/2014 as well as prior mammograms and ultrasound.  FINDINGS: Breast composition: b. Scattered fibroglandular tissue.  Background parenchymal enhancement: Mild  Right breast: There is marked decrease enhancement and confluence of disease in the upper-outer quadrant of the right breast. The remaining area of enhancement is significantly less masslike then seen on the prior exam. The residual clumped non mass enhancement as measured on the MIP images measures 8.1 x 3.8 x 4.0 cm, previously measuring 9.9 x 7.8 x 9.0 cm remeasured in similar plane (this remeasured area incorporates the mass, non mass enhancement and the scattered satellite nodules described on the previous report, as there are currently no discrete borders for measurement. Susceptibility artifact is again seen in the posterior-lateral aspect of this non mass enhancement, compatible with biopsy marking clip. No definite skin, nipple or pectoral muscle enhancement.  Left breast: No mass or abnormal enhancement.  Note that evaluation of the medial aspect of the bilateral breasts on the postcontrast subtracted images is limited due to poor fat saturation, similar to  the prior study, likely due to proximity of pendulous breasts to the coil.  Lymph nodes: Interval decrease in size of the previously noted abnormal lymph nodes in the right axilla. The largest as measured on the T1 precontrast images measures 1.2 cm in long axis, previously measuring 2.0 cm.   Ancillary findings: Interval placement of a chest port in the medial left upper chest.  IMPRESSION: Marked interval reduction in confluence and size of the enhancement in the upper-outer quadrant of the right breast status post initiation of neoadjuvant chemotherapy.  RECOMMENDATION: Continue treatment plan.  BI-RADS CATEGORY  BI-RADS 6: Known malignancy.   Electronically Signed   By: Ammie Ferrier M.D.   On: 10/06/2014 10:30    ASSESSMENT: 64 y.o. [place] woman status post right breast and right axillary lymph node biopsy 07/19/2014 for a cT2  pN1, stage IIB  invasive ductal carcinoma, grade 2, estrogen and progesterone receptor negative, with an MIB-1 of 90%, and HER-2 amplified  (1) neoadjuvant chemotherapy started 08/08/2014, consisting of carboplatin, docetaxel, trastuzumab and pertuzumab given every 3 weeks 6  (a) breast MRI after initial 3 cycles shows a significant response  (2) trastuzumab to be continued to complete one year  (a) echo 07/29/2014 shows a normal ejection fraction  (3) Definitive surgery to follow chemotherapy, with consideration of the Alliance trial  (4) adjuvant radiation to follow surgery   PLAN: Darlene [name] is feeling decently today. The labs were reviewed in detail and were stable. IV fluids were so helpful this past week, that she will have 2 days of IV fluids built into her next cycle.  Since being more aggressive with her anti-diarrhea agents, she has seen improvement in the frequency of her loose stools, so of course she will continue. I have refilled her lomotil today.   Darlene [name] will return in 2 weeks for cycle 5 of treatment. She understands and agrees with this plan. She knows the goal of treatment in her case is cure. She has been encouraged to call with any issues that might arise before her next visit here.   Laurie Panda, NP   10/17/2014 3:48 PM

## 2014-10-20 ENCOUNTER — Other Ambulatory Visit: Payer: Self-pay | Admitting: Nurse Practitioner

## 2014-10-20 DIAGNOSIS — C50411 Malignant neoplasm of upper-outer quadrant of right female breast: Secondary | ICD-10-CM

## 2014-10-24 ENCOUNTER — Other Ambulatory Visit: Payer: Self-pay | Admitting: *Deleted

## 2014-10-24 ENCOUNTER — Telehealth: Payer: Self-pay | Admitting: *Deleted

## 2014-10-24 DIAGNOSIS — C50411 Malignant neoplasm of upper-outer quadrant of right female breast: Secondary | ICD-10-CM

## 2014-10-24 NOTE — Telephone Encounter (Signed)
Spoke to pt concerning scheduling MRI and f/u with Dr. Barry Dienes. Pt relate she will call GI to schedule MRI after chemo on 10/31. Denies further needs at this time.

## 2014-10-25 ENCOUNTER — Other Ambulatory Visit: Payer: Self-pay | Admitting: Nurse Practitioner

## 2014-10-25 ENCOUNTER — Telehealth: Payer: Self-pay | Admitting: *Deleted

## 2014-10-25 NOTE — Telephone Encounter (Signed)
Pt called to inform she is scheduled for MRI on 11/1 and f/u with Dr. Barry Dienes on 11/4.

## 2014-10-31 ENCOUNTER — Other Ambulatory Visit (HOSPITAL_BASED_OUTPATIENT_CLINIC_OR_DEPARTMENT_OTHER): Payer: BLUE CROSS/BLUE SHIELD

## 2014-10-31 ENCOUNTER — Ambulatory Visit (HOSPITAL_BASED_OUTPATIENT_CLINIC_OR_DEPARTMENT_OTHER): Payer: BLUE CROSS/BLUE SHIELD | Admitting: Nurse Practitioner

## 2014-10-31 ENCOUNTER — Ambulatory Visit (HOSPITAL_BASED_OUTPATIENT_CLINIC_OR_DEPARTMENT_OTHER): Payer: BLUE CROSS/BLUE SHIELD

## 2014-10-31 ENCOUNTER — Telehealth: Payer: Self-pay | Admitting: Nurse Practitioner

## 2014-10-31 ENCOUNTER — Encounter: Payer: Self-pay | Admitting: Nurse Practitioner

## 2014-10-31 VITALS — BP 131/51 | HR 70 | Temp 97.7°F | Resp 18 | Ht 69.0 in | Wt 226.6 lb

## 2014-10-31 DIAGNOSIS — C50411 Malignant neoplasm of upper-outer quadrant of right female breast: Secondary | ICD-10-CM | POA: Diagnosis not present

## 2014-10-31 DIAGNOSIS — Z171 Estrogen receptor negative status [ER-]: Secondary | ICD-10-CM | POA: Diagnosis not present

## 2014-10-31 DIAGNOSIS — C773 Secondary and unspecified malignant neoplasm of axilla and upper limb lymph nodes: Secondary | ICD-10-CM

## 2014-10-31 DIAGNOSIS — Z5112 Encounter for antineoplastic immunotherapy: Secondary | ICD-10-CM

## 2014-10-31 DIAGNOSIS — E1140 Type 2 diabetes mellitus with diabetic neuropathy, unspecified: Secondary | ICD-10-CM | POA: Diagnosis not present

## 2014-10-31 DIAGNOSIS — Z5189 Encounter for other specified aftercare: Secondary | ICD-10-CM

## 2014-10-31 DIAGNOSIS — Z5111 Encounter for antineoplastic chemotherapy: Secondary | ICD-10-CM

## 2014-10-31 LAB — CBC WITH DIFFERENTIAL/PLATELET
BASO%: 0.2 % (ref 0.0–2.0)
Basophils Absolute: 0 10*3/uL (ref 0.0–0.1)
EOS%: 0 % (ref 0.0–7.0)
Eosinophils Absolute: 0 10*3/uL (ref 0.0–0.5)
HCT: 29.4 % — ABNORMAL LOW (ref 34.8–46.6)
HGB: 9.7 g/dL — ABNORMAL LOW (ref 11.6–15.9)
LYMPH%: 8.2 % — ABNORMAL LOW (ref 14.0–49.7)
MCH: 31.6 pg (ref 25.1–34.0)
MCHC: 33.1 g/dL (ref 31.5–36.0)
MCV: 95.4 fL (ref 79.5–101.0)
MONO#: 0.3 10*3/uL (ref 0.1–0.9)
MONO%: 2.7 % (ref 0.0–14.0)
NEUT#: 10.2 10*3/uL — ABNORMAL HIGH (ref 1.5–6.5)
NEUT%: 88.9 % — ABNORMAL HIGH (ref 38.4–76.8)
Platelets: 230 10*3/uL (ref 145–400)
RBC: 3.08 10*6/uL — ABNORMAL LOW (ref 3.70–5.45)
RDW: 17.2 % — ABNORMAL HIGH (ref 11.2–14.5)
WBC: 11.5 10*3/uL — ABNORMAL HIGH (ref 3.9–10.3)
lymph#: 0.9 10*3/uL (ref 0.9–3.3)

## 2014-10-31 LAB — COMPREHENSIVE METABOLIC PANEL (CC13)
ALT: 29 U/L (ref 0–55)
AST: 24 U/L (ref 5–34)
Albumin: 3.3 g/dL — ABNORMAL LOW (ref 3.5–5.0)
Alkaline Phosphatase: 88 U/L (ref 40–150)
Anion Gap: 11 mEq/L (ref 3–11)
BUN: 18.2 mg/dL (ref 7.0–26.0)
CO2: 23 mEq/L (ref 22–29)
Calcium: 9 mg/dL (ref 8.4–10.4)
Chloride: 107 mEq/L (ref 98–109)
Creatinine: 1.1 mg/dL (ref 0.6–1.1)
EGFR: 54 mL/min/{1.73_m2} — ABNORMAL LOW (ref >90)
Glucose: 235 mg/dl — ABNORMAL HIGH (ref 70–140)
Potassium: 4.8 mEq/L (ref 3.5–5.1)
Sodium: 140 mEq/L (ref 136–145)
Total Bilirubin: 0.75 mg/dL (ref 0.20–1.20)
Total Protein: 5.9 g/dL — ABNORMAL LOW (ref 6.4–8.3)

## 2014-10-31 LAB — FERRITIN CHCC: Ferritin: 133 ng/ml (ref 9–269)

## 2014-10-31 MED ORDER — ACETAMINOPHEN 325 MG PO TABS
ORAL_TABLET | ORAL | Status: AC
  Filled 2014-10-31: qty 2

## 2014-10-31 MED ORDER — TRASTUZUMAB CHEMO INJECTION 440 MG
6.0000 mg/kg | Freq: Once | INTRAVENOUS | Status: AC
  Administered 2014-10-31: 609 mg via INTRAVENOUS
  Filled 2014-10-31: qty 29

## 2014-10-31 MED ORDER — SODIUM CHLORIDE 0.9 % IV SOLN
75.0000 mg/m2 | Freq: Once | INTRAVENOUS | Status: AC
  Administered 2014-10-31: 170 mg via INTRAVENOUS
  Filled 2014-10-31: qty 17

## 2014-10-31 MED ORDER — SODIUM CHLORIDE 0.9 % IV SOLN
Freq: Once | INTRAVENOUS | Status: AC
  Administered 2014-10-31: 10:00:00 via INTRAVENOUS
  Filled 2014-10-31: qty 8

## 2014-10-31 MED ORDER — SODIUM CHLORIDE 0.9 % IJ SOLN
10.0000 mL | INTRAMUSCULAR | Status: DC | PRN
  Administered 2014-10-31: 10 mL
  Filled 2014-10-31: qty 10

## 2014-10-31 MED ORDER — SODIUM CHLORIDE 0.9 % IV SOLN
547.5000 mg | Freq: Once | INTRAVENOUS | Status: AC
  Administered 2014-10-31: 550 mg via INTRAVENOUS
  Filled 2014-10-31: qty 55

## 2014-10-31 MED ORDER — SODIUM CHLORIDE 0.9 % IV SOLN
420.0000 mg | Freq: Once | INTRAVENOUS | Status: AC
  Administered 2014-10-31: 420 mg via INTRAVENOUS
  Filled 2014-10-31: qty 14

## 2014-10-31 MED ORDER — PEGFILGRASTIM 6 MG/0.6ML ~~LOC~~ PSKT
6.0000 mg | PREFILLED_SYRINGE | Freq: Once | SUBCUTANEOUS | Status: AC
  Administered 2014-10-31: 6 mg via SUBCUTANEOUS
  Filled 2014-10-31: qty 0.6

## 2014-10-31 MED ORDER — DIPHENHYDRAMINE HCL 25 MG PO CAPS
ORAL_CAPSULE | ORAL | Status: AC
  Filled 2014-10-31: qty 2

## 2014-10-31 MED ORDER — HEPARIN SOD (PORK) LOCK FLUSH 100 UNIT/ML IV SOLN
500.0000 [IU] | Freq: Once | INTRAVENOUS | Status: AC | PRN
  Administered 2014-10-31: 500 [IU]
  Filled 2014-10-31: qty 5

## 2014-10-31 MED ORDER — SODIUM CHLORIDE 0.9 % IV SOLN
Freq: Once | INTRAVENOUS | Status: AC
  Administered 2014-10-31: 10:00:00 via INTRAVENOUS

## 2014-10-31 MED ORDER — ACETAMINOPHEN 325 MG PO TABS
650.0000 mg | ORAL_TABLET | Freq: Once | ORAL | Status: AC
  Administered 2014-10-31: 650 mg via ORAL

## 2014-10-31 MED ORDER — DIPHENHYDRAMINE HCL 25 MG PO CAPS
25.0000 mg | ORAL_CAPSULE | Freq: Once | ORAL | Status: AC
  Administered 2014-10-31: 25 mg via ORAL

## 2014-10-31 NOTE — Progress Notes (Signed)
[place]  Telephone:([phone] Fax:([phone]) [phone]     ID: [name] DOB: 1950-12-28  MR#: [id number]  JKD#:[id number]  Patient Care Team: Thressa Sheller, MD as PCP - General (Internal Medicine) Bryson Ha Himmelrich, RD as Dietitian (Bariatrics) Stark Klein, MD as Consulting Physician (General Surgery) Chauncey Cruel, MD as Consulting Physician (Oncology) Arloa Koh, MD as Consulting Physician (Radiation Oncology) Mauro Kaufmann, RN as Registered Nurse Rockwell Germany, RN as Registered Nurse Jacelyn Pi, MD as Consulting Physician (Endocrinology) PCP: Thressa Sheller, MD GYN: Kem Boroughs FNP OTHER MD:  CHIEF COMPLAINT:  HER-2 positive , estrogen receptor negative breast cancer  CURRENT TREATMENT:  Neoadjuvant chemotherapy with anti-HER-2 treatment   BREAST CANCER HISTORY: From the original intake note:  "Darlene [name]" had screening mammography at her gynecologist suggestive of a possible abnormality in the right breast. However right diagnostic mammogram 04/12/2013 at [facility] was negative. More recently however, she noted a lump in her right breast and brought it to her gynecologist's attention. On 07/18/2014 the patient had bilateral diagnostic mammography with tomosynthesis at [facility]. The breast density was category B. In the right breast upper outer quadrant there was an area of focal asymmetry with indistinct margins.[name] Ultrasound was obtained and showed in addition to the mass in question, measuring 1.7 cm, an abnormal-appearing lymph node in the right axillary tail.  On 07/19/2014 the patient underwent biopsy of the right breast massin question as well as a right axillary lymph node. Both were positive for invasive ductal carcinoma, grade 2, estrogen and progesterone receptor negative, with an MIB-1 of 90%, and HER-2 pending. Incidentally the lymph node biopsy showed a lymphocytic inflammatory component but no lymph node architecture.  Her subsequent  history is as detailed below.  INTERVAL HISTORY:  Darlene [name] returns today for follow-up of her breast cancer, accompanied by her husband Darlene [name]. Today is day 1, cycle 5 of 6 planned cycles of of carboplatin, docetaxel, trastuzumab and pertuzumab given every 3 weeks, with neulasta given on day 2.  REVIEW OF SYSTEMS: Lyn denies fevers, chills, nausea, or vomiting. She is feeling somewhat "stopped up" since using so much imodium, but she is not worried as she generally has loose stools on the week of treatment. She denies mouth sores or rashes. The residual neuropathy to her bilateral feet is unchanged. Her energy level has improved. Her appetite is good and she has gained some weight. A detailed review of systems is otherwise stable.   PAST MEDICAL HISTORY: Past Medical History  Diagnosis Date  . Obesity   . Hypertension   . Hyperlipidemia   . Obstructive sleep apnea on CPAP   . Diabetes mellitus without complication     09+ years  . Eating disorder     binge eating  . Balance problem   . Arthritis     in fingers, shoulders  . Anemia     hx of  . Depression   . Ovarian cyst rupture     possible  . Clumsiness   . Breast cancer of upper-outer quadrant of right female breast 07/22/2014  . Breast cancer   . Anxiety   . Neuromuscular disorder     PAST SURGICAL HISTORY: Past Surgical History  Procedure Laterality Date  . Carpal tunnel release Right   . Carpal tunnel release Left   . Breath tek h pylori N/A 09/15/2012    Procedure: BREATH TEK H PYLORI;  Surgeon: Pedro Earls, MD;  Location: [facility];  Service: General;  Laterality: N/A;  .  Dupuytren contracture release    . Trigger finger release      x3  . Toe surgery  1970s    bone spur  . Rotator cuff repair Right 2005  . Gastric roux-en-y N/A 11/02/2012    Procedure: LAPAROSCOPIC ROUX-EN-Y GASTRIC;  Surgeon: Pedro Earls, MD;  Location: [facility];  Service: General;  Laterality: N/A;  . Bunionectomy Left 12/2013  .  Portacath placement Left 08/02/2014    Procedure: INSERTION PORT-A-CATH;  Surgeon: Stark Klein, MD;  Location: [facility];  Service: General;  Laterality: Left;    FAMILY HISTORY Family History  Problem Relation Age of Onset  . CVA Mother   . Diabetes Father   . Heart failure Father   . Hypertension Sister   . Diabetes Brother   . Diabetes Paternal Uncle    the patient's father died from complications of diabetes at age 2. The patient's mother died at age 60 with a subarachnoid hemorrhage. The patient had one brother, one sister. The only breast cancer was the patient's paternal grandmother diagnosed in her 22s. There is no history of ovarian cancer in the family.  GYNECOLOGIC HISTORY:  Patient's last menstrual period was 10/22/2007.  menarche age 38, the patient is GX P0. She went through the change of life in 2011. She did not take hormone replacement.  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 Her husband Darlene [name] is a retired Visual merchandiser (he taught at [employer]).  Darlene [name] has 2 children from an earlier marriage, Rockey Situ who teaches in [place], and Nayelis Bonito,  who works at the [employer] in in [place]. He has 1 grand son, aged 29. The patient and her husband are not church attender's    ADVANCED DIRECTIVES:  Not in place   HEALTH MAINTENANCE: Social History  Substance Use Topics  . Smoking status: Never Smoker   . Smokeless tobacco: Never Used  . Alcohol Use: 0.0 oz/week    0 Standard drinks or equivalent per week     Colonoscopy:  May 2016  PAP:  Bone density: 07/05/2013 normal, with a T score of 0.4  Lipid panel:  Allergies  Allergen Reactions  . Other Itching    Peanuts in large quantities     Current Outpatient Prescriptions  Medication Sig Dispense Refill  . acetaminophen (TYLENOL) 500 MG tablet Take 500 mg by mouth every 6 (six) hours as needed for pain.     [name] aspirin 81 MG tablet Take 81 mg by mouth daily.      . Calcium Citrate-Vitamin D (CALCIUM CITRATE+D3 PO) Take 1,500 mg by mouth daily.    . cholestyramine (QUESTRAN) 4 G packet Take 1 packet (4 g total) by mouth 2 (two) times daily. 60 each 1  . dexamethasone (DECADRON) 4 MG tablet Take 2 tablets twice daily with food the day BEFORE chemo. 30 tablet 1  . diphenoxylate-atropine (LOMOTIL) 2.5-0.025 MG per tablet Take 1 tablet by mouth 4 (four) times daily as needed for diarrhea or loose stools. 60 tablet 1  . Docusate Sodium (COLACE PO) Take 1 capsule by mouth 2 (two) times daily.     [name] glucose blood (ONETOUCH VERIO) test strip 1 each by Other route as needed for other. Use as instructed    . lidocaine-prilocaine (EMLA) cream Apply to affected area once 30 g 3  . LORazepam (ATIVAN) 0.5 MG tablet Take 1 tablet (0.5 mg total) by  mouth at bedtime. 30 tablet 0  . losartan (COZAAR) 100 MG tablet Take 1 tablet by mouth daily.     . mometasone (NASONEX) 50 MCG/ACT nasal spray Place 2 sprays into the nose daily as needed.      . Multiple Vitamin (MULTIVITAMIN WITH MINERALS) TABS tablet Take 1 tablet by mouth 2 (two) times daily. With iron    . NOVOLIN 70/30 (70-30) 100 UNIT/ML injection 10 units two times a day    . ondansetron (ZOFRAN) 8 MG tablet Take 1 tablet (8 mg total) by mouth 2 (two) times daily. Start the day after chemo for 3 days. Then take as needed for nausea or vomiting. 30 tablet 1  . Probiotic Product ([employer] PROBIOTIC PO) Take 1 capsule by mouth daily.    . prochlorperazine (COMPAZINE) 10 MG tablet Take one table before meals and at bedtime (4 times a day) starint the evening of chemo and continuing for 3 full days 30 tablet 1  . tobramycin-dexamethasone (TOBRADEX) ophthalmic solution Place 1 drop into both eyes 2 (two) times daily. 5 mL 1  . Vortioxetine HBr (BRINTELLIX) 20 MG TABS Take 20 mg by mouth daily. 90 tablet 0   No current facility-administered medications for this visit.     OBJECTIVE:  Middle-aged white woman who appears stated age 74 Vitals:   10/31/14 0855  BP: 131/51  Pulse: 70  Temp: 97.7 F (36.5 C)  Resp: 18     Body mass index is 33.45 kg/(m^2).    ECOG FS:1 - Symptomatic but completely ambulatory  Sclerae unicteric, pupils round and equal Oropharynx clear and moist-- no thrush or other lesions No cervical or supraclavicular adenopathy Lungs no rales or rhonchi Heart regular rate and rhythm Abd soft, nontender, positive bowel sounds MSK no focal spinal tenderness, no upper extremity lymphedema Neuro: nonfocal, well oriented, appropriate affect Breasts: deferred  LAB RESULTS:  CMP     Component Value Date/Time   NA 141 10/17/2014 1358   NA 140 02/18/2013 0827   K 4.8 10/17/2014 1358   K 4.3 02/18/2013 0827   CL 104 02/18/2013 0827   CO2 27 10/17/2014 1358   CO2 25 02/18/2013 0827   GLUCOSE 239* 10/17/2014 1358   GLUCOSE 168* 02/18/2013 0827   BUN 20.6 10/17/2014 1358   BUN 26* 02/18/2013 0827   CREATININE 1.2* 10/17/2014 1358   CREATININE 1.10 02/18/2013 0827   CREATININE 1.37* 11/02/2012 1230   CALCIUM 8.7 10/17/2014 1358   CALCIUM 9.5 02/18/2013 0827   PROT 5.6* 10/17/2014 1358   PROT 6.3 02/18/2013 0827   ALBUMIN 3.2* 10/17/2014 1358   ALBUMIN 3.9 02/18/2013 0827   AST 23 10/17/2014 1358   AST 23 02/18/2013 0827   ALT 24 10/17/2014 1358   ALT 36* 02/18/2013 0827   ALKPHOS 98 10/17/2014 1358   ALKPHOS 68 02/18/2013 0827   BILITOT 0.62 10/17/2014 1358   BILITOT 0.9 02/18/2013 0827   GFRNONAA 55* 02/18/2013 0827   GFRNONAA 41* 11/02/2012 1230   GFRAA 63 02/18/2013 0827   GFRAA 48* 11/02/2012 1230    INo results found for: SPEP, UPEP  Lab Results  Component Value Date   WBC 11.5* 10/31/2014   NEUTROABS 10.2* 10/31/2014   HGB 9.7* 10/31/2014   HCT 29.4* 10/31/2014   MCV 95.4 10/31/2014   PLT 230 10/31/2014      Chemistry      Component Value Date/Time   NA 141 10/17/2014 1358   NA 140 02/18/2013  0827   K  4.8 10/17/2014 1358   K 4.3 02/18/2013 0827   CL 104 02/18/2013 0827   CO2 27 10/17/2014 1358   CO2 25 02/18/2013 0827   BUN 20.6 10/17/2014 1358   BUN 26* 02/18/2013 0827   CREATININE 1.2* 10/17/2014 1358   CREATININE 1.10 02/18/2013 0827   CREATININE 1.37* 11/02/2012 1230      Component Value Date/Time   CALCIUM 8.7 10/17/2014 1358   CALCIUM 9.5 02/18/2013 0827   ALKPHOS 98 10/17/2014 1358   ALKPHOS 68 02/18/2013 0827   AST 23 10/17/2014 1358   AST 23 02/18/2013 0827   ALT 24 10/17/2014 1358   ALT 36* 02/18/2013 0827   BILITOT 0.62 10/17/2014 1358   BILITOT 0.9 02/18/2013 0827       No results found for: LABCA2  No components found for: LABCA125  No results for input(s): INR in the last 168 hours.  Urinalysis    Component Value Date/Time   BILIRUBINUR neg 06/29/2013 1017   PROTEINUR trace 06/29/2013 1017   UROBILINOGEN negative 06/29/2013 1017   NITRITE neg 06/29/2013 1017   LEUKOCYTESUR Negative 06/29/2013 1017    STUDIES: Mr Breast Bilateral W Wo Contrast  10/06/2014   CLINICAL DATA:  64 year old female with a right breast invasive ductal carcinoma and positive right axillary lymph node, which was biopsied 07/19/2014. The patient is being treated with neoadjuvant chemotherapy prior to definitive surgery and radiation therapy. The mass was palpable on initial presentation.  LABS:  The most recent laboratory values were obtained on 09/27/2014, with creatinine of 1.5 mg/dL and GFR of 38.  EXAM: BILATERAL BREAST MRI WITH AND WITHOUT CONTRAST  TECHNIQUE: Multiplanar, multisequence MR images of both breasts were obtained prior to and following the intravenous administration of 10 ml of MultiHance.  THREE-DIMENSIONAL MR IMAGE RENDERING ON INDEPENDENT WORKSTATION:  Three-dimensional MR images were rendered by post-processing of the original MR data on an independent workstation. The three-dimensional MR images were interpreted, and findings are reported in the  following complete MRI report for this study. Three dimensional images were evaluated at the independent DynaCad workstation  COMPARISON:  Breast MRI 08/01/2014 as well as prior mammograms and ultrasound.  FINDINGS: Breast composition: b. Scattered fibroglandular tissue.  Background parenchymal enhancement: Mild  Right breast: There is marked decrease enhancement and confluence of disease in the upper-outer quadrant of the right breast. The remaining area of enhancement is significantly less masslike then seen on the prior exam. The residual clumped non mass enhancement as measured on the MIP images measures 8.1 x 3.8 x 4.0 cm, previously measuring 9.9 x 7.8 x 9.0 cm remeasured in similar plane (this remeasured area incorporates the mass, non mass enhancement and the scattered satellite nodules described on the previous report, as there are currently no discrete borders for measurement. Susceptibility artifact is again seen in the posterior-lateral aspect of this non mass enhancement, compatible with biopsy marking clip. No definite skin, nipple or pectoral muscle enhancement.  Left breast: No mass or abnormal enhancement.  Note that evaluation of the medial aspect of the bilateral breasts on the postcontrast subtracted images is limited due to poor fat saturation, similar to the prior study, likely due to proximity of pendulous breasts to the coil.  Lymph nodes: Interval decrease in size of the previously noted abnormal lymph nodes in the right axilla. The largest as measured on the T1 precontrast images measures 1.2 cm in long axis, previously measuring 2.0 cm.  Ancillary findings: Interval placement of a chest port in the medial  left upper chest.  IMPRESSION: Marked interval reduction in confluence and size of the enhancement in the upper-outer quadrant of the right breast status post initiation of neoadjuvant chemotherapy.  RECOMMENDATION: Continue treatment plan.  BI-RADS CATEGORY  BI-RADS 6: Known malignancy.    Electronically Signed   By: Ammie Ferrier M.D.   On: 10/06/2014 10:30    ASSESSMENT: 64 y.o.  woman status post right breast and right axillary lymph node biopsy 07/19/2014 for a cT2  pN1, stage IIB  invasive ductal carcinoma, grade 2, estrogen and progesterone receptor negative, with an MIB-1 of 90%, and HER-2 amplified  (1) neoadjuvant chemotherapy started 08/08/2014, consisting of carboplatin, docetaxel, trastuzumab and pertuzumab given every 3 weeks 6  (a) breast MRI after initial 3 cycles shows a significant response  (2) trastuzumab to be continued to complete one year  (a) echo 07/29/2014 shows a normal ejection fraction  (3) Definitive surgery to follow chemotherapy, with consideration of the Alliance trial  (4) adjuvant radiation to follow surgery   PLAN: Darlene [name] is feeling much improved today. The labs were reviewed in detail and were entirely stable. She will proceed with cycle 5 of carboplatin, docetaxel, trastuzumab, and pertuzumab as planned today. She will return tomorrow and Wednesday for prophylactic IV hydration.   She is due for a repeat echocardiogram this month, so I have placed orders during this visit.   Darlene [name] will return in 1 week for labs and a nadir visit. She understands and agrees with this plan. She knows the goal of treatment in her case is cure. She has been encouraged to call with any issues that might arise before her next visit here.   Laurie Panda, NP   10/31/2014 8:57 AM

## 2014-10-31 NOTE — Telephone Encounter (Signed)
avs printed for patient,echo to [facility] for precert

## 2014-10-31 NOTE — Patient Instructions (Signed)
[facility] Discharge Instructions for Patients Receiving Chemotherapy  Today you received the following chemotherapy agents Taxotere, Carboplatin, Herceptin, Perjeta.  You also received Neulasta.  To help prevent nausea and vomiting after your treatment, we encourage you to take your nausea medication as directed by your doctor.  If you develop nausea and vomiting that is not controlled by your nausea medication, call the clinic.   BELOW ARE SYMPTOMS THAT SHOULD BE REPORTED IMMEDIATELY:  *FEVER GREATER THAN 100.5 F  *CHILLS WITH OR WITHOUT FEVER  NAUSEA AND VOMITING THAT IS NOT CONTROLLED WITH YOUR NAUSEA MEDICATION  *UNUSUAL SHORTNESS OF BREATH  *UNUSUAL BRUISING OR BLEEDING  TENDERNESS IN MOUTH AND THROAT WITH OR WITHOUT PRESENCE OF ULCERS  *URINARY PROBLEMS  *BOWEL PROBLEMS  UNUSUAL RASH Items with * indicate a potential emergency and should be followed up as soon as possible.  Feel free to call the clinic you have any questions or concerns. The clinic phone number is [phone].  Please show the [facility] at check-in to the Emergency Department and triage nurse.

## 2014-11-01 ENCOUNTER — Telehealth: Payer: Self-pay | Admitting: Nurse Practitioner

## 2014-11-01 ENCOUNTER — Ambulatory Visit (HOSPITAL_BASED_OUTPATIENT_CLINIC_OR_DEPARTMENT_OTHER): Payer: BLUE CROSS/BLUE SHIELD

## 2014-11-01 VITALS — BP 143/53 | HR 70 | Temp 98.3°F | Resp 20

## 2014-11-01 DIAGNOSIS — C50411 Malignant neoplasm of upper-outer quadrant of right female breast: Secondary | ICD-10-CM | POA: Diagnosis not present

## 2014-11-01 DIAGNOSIS — E860 Dehydration: Secondary | ICD-10-CM

## 2014-11-01 LAB — FOLATE: Folate: 14.7 ng/mL

## 2014-11-01 LAB — VITAMIN B12: Vitamin B-12: >2000 pg/mL — ABNORMAL HIGH (ref 211–911)

## 2014-11-01 MED ORDER — ONDANSETRON HCL 40 MG/20ML IJ SOLN
Freq: Once | INTRAMUSCULAR | Status: AC
  Administered 2014-11-01: 10:00:00 via INTRAVENOUS
  Filled 2014-11-01: qty 4

## 2014-11-01 MED ORDER — HEPARIN SOD (PORK) LOCK FLUSH 100 UNIT/ML IV SOLN
500.0000 [IU] | Freq: Once | INTRAVENOUS | Status: AC | PRN
  Administered 2014-11-01: 500 [IU]
  Filled 2014-11-01: qty 5

## 2014-11-01 MED ORDER — SODIUM CHLORIDE 0.9 % IJ SOLN
10.0000 mL | INTRAMUSCULAR | Status: DC | PRN
  Administered 2014-11-01: 10 mL
  Filled 2014-11-01: qty 10

## 2014-11-01 MED ORDER — SODIUM CHLORIDE 0.9 % IV SOLN
Freq: Once | INTRAVENOUS | Status: AC
  Administered 2014-11-01: 10:00:00 via INTRAVENOUS

## 2014-11-01 NOTE — Telephone Encounter (Signed)
Called and left a message with echo

## 2014-11-01 NOTE — Patient Instructions (Signed)
Dehydration, Adult Dehydration is when you lose more fluids from the body than you take in. Vital organs like the kidneys, brain, and heart cannot function without a proper amount of fluids and salt. Any loss of fluids from the body can cause dehydration.  CAUSES   Vomiting.  Diarrhea.  Excessive sweating.  Excessive urine output.  Fever. SYMPTOMS  Mild dehydration  Thirst.  Dry lips.  Slightly dry mouth. Moderate dehydration  Very dry mouth.  Sunken eyes.  Skin does not bounce back quickly when lightly pinched and released.  Dark urine and decreased urine production.  Decreased tear production.  Headache. Severe dehydration  Very dry mouth.  Extreme thirst.  Rapid, weak pulse (more than 100 beats per minute at rest).  Cold hands and feet.  Not able to sweat in spite of heat and temperature.  Rapid breathing.  Blue lips.  Confusion and lethargy.  Difficulty being awakened.  Minimal urine production.  No tears. DIAGNOSIS  Your caregiver will diagnose dehydration based on your symptoms and your exam. Blood and urine tests will help confirm the diagnosis. The diagnostic evaluation should also identify the cause of dehydration. TREATMENT  Treatment of mild or moderate dehydration can often be done at home by increasing the amount of fluids that you drink. It is best to drink small amounts of fluid more often. Drinking too much at one time can make vomiting worse. Refer to the home care instructions below. Severe dehydration needs to be treated at the hospital where you will probably be given intravenous (IV) fluids that contain water and electrolytes. HOME CARE INSTRUCTIONS   Ask your caregiver about specific rehydration instructions.  Drink enough fluids to keep your urine clear or pale yellow.  Drink small amounts frequently if you have nausea and vomiting.  Eat as you normally do.  Avoid:  Foods or drinks high in sugar.  Carbonated  drinks.  Juice.  Extremely hot or cold fluids.  Drinks with caffeine.  Fatty, greasy foods.  Alcohol.  Tobacco.  Overeating.  Gelatin desserts.  Wash your hands well to avoid spreading bacteria and viruses.  Only take over-the-counter or prescription medicines for pain, discomfort, or fever as directed by your caregiver.  Ask your caregiver if you should continue all prescribed and over-the-counter medicines.  Keep all follow-up appointments with your caregiver. SEEK MEDICAL CARE IF:  You have abdominal pain and it increases or stays in one area (localizes).  You have a rash, stiff neck, or severe headache.  You are irritable, sleepy, or difficult to awaken.  You are weak, dizzy, or extremely thirsty. SEEK IMMEDIATE MEDICAL CARE IF:   You are unable to keep fluids down or you get worse despite treatment.  You have frequent episodes of vomiting or diarrhea.  You have blood or green matter (bile) in your vomit.  You have blood in your stool or your stool looks black and tarry.  You have not urinated in 6 to 8 hours, or you have only urinated a small amount of very dark urine.  You have a fever.  You faint. MAKE SURE YOU:   Understand these instructions.  Will watch your condition.  Will get help right away if you are not doing well or get worse. Document Released: 01/07/2005 Document Revised: 04/01/2011 Document Reviewed: 08/27/2010 ExitCare Patient Information 2015 [employer]. This information is not intended to replace advice given to you by your health care provider. Make sure you discuss any questions you have with your health care   provider.  

## 2014-11-02 ENCOUNTER — Ambulatory Visit: Payer: BLUE CROSS/BLUE SHIELD

## 2014-11-07 ENCOUNTER — Other Ambulatory Visit: Payer: Self-pay | Admitting: *Deleted

## 2014-11-07 ENCOUNTER — Encounter: Payer: Self-pay | Admitting: Nurse Practitioner

## 2014-11-07 ENCOUNTER — Ambulatory Visit (HOSPITAL_BASED_OUTPATIENT_CLINIC_OR_DEPARTMENT_OTHER): Payer: BLUE CROSS/BLUE SHIELD | Admitting: Nurse Practitioner

## 2014-11-07 ENCOUNTER — Other Ambulatory Visit (HOSPITAL_BASED_OUTPATIENT_CLINIC_OR_DEPARTMENT_OTHER): Payer: BLUE CROSS/BLUE SHIELD

## 2014-11-07 VITALS — BP 130/48 | HR 87 | Temp 98.3°F | Resp 19 | Ht 69.0 in | Wt 217.4 lb

## 2014-11-07 DIAGNOSIS — C773 Secondary and unspecified malignant neoplasm of axilla and upper limb lymph nodes: Secondary | ICD-10-CM | POA: Diagnosis not present

## 2014-11-07 DIAGNOSIS — C50411 Malignant neoplasm of upper-outer quadrant of right female breast: Secondary | ICD-10-CM

## 2014-11-07 DIAGNOSIS — Z171 Estrogen receptor negative status [ER-]: Secondary | ICD-10-CM

## 2014-11-07 LAB — COMPREHENSIVE METABOLIC PANEL (CC13)
ALT: 23 U/L (ref 0–55)
AST: 23 U/L (ref 5–34)
Albumin: 3.3 g/dL — ABNORMAL LOW (ref 3.5–5.0)
Alkaline Phosphatase: 90 U/L (ref 40–150)
Anion Gap: 4 mEq/L (ref 3–11)
BUN: 13.2 mg/dL (ref 7.0–26.0)
CO2: 28 mEq/L (ref 22–29)
Calcium: 8.9 mg/dL (ref 8.4–10.4)
Chloride: 109 mEq/L (ref 98–109)
Creatinine: 1.1 mg/dL (ref 0.6–1.1)
EGFR: 57 mL/min/{1.73_m2} — ABNORMAL LOW (ref >90)
Glucose: 158 mg/dl — ABNORMAL HIGH (ref 70–140)
Potassium: 4.5 mEq/L (ref 3.5–5.1)
Sodium: 142 mEq/L (ref 136–145)
Total Bilirubin: 0.62 mg/dL (ref 0.20–1.20)
Total Protein: 5.7 g/dL — ABNORMAL LOW (ref 6.4–8.3)

## 2014-11-07 LAB — CBC WITH DIFFERENTIAL/PLATELET
BASO%: 0.2 % (ref 0.0–2.0)
Basophils Absolute: 0 10*3/uL (ref 0.0–0.1)
EOS%: 0.5 % (ref 0.0–7.0)
Eosinophils Absolute: 0 10*3/uL (ref 0.0–0.5)
HCT: 27.6 % — ABNORMAL LOW (ref 34.8–46.6)
HGB: 9 g/dL — ABNORMAL LOW (ref 11.6–15.9)
LYMPH%: 27.2 % (ref 14.0–49.7)
MCH: 31.7 pg (ref 25.1–34.0)
MCHC: 32.6 g/dL (ref 31.5–36.0)
MCV: 97.2 fL (ref 79.5–101.0)
MONO#: 1.3 10*3/uL — ABNORMAL HIGH (ref 0.1–0.9)
MONO%: 21 % — ABNORMAL HIGH (ref 0.0–14.0)
NEUT#: 3.2 10*3/uL (ref 1.5–6.5)
NEUT%: 51.1 % (ref 38.4–76.8)
Platelets: 145 10*3/uL (ref 145–400)
RBC: 2.84 10*6/uL — ABNORMAL LOW (ref 3.70–5.45)
RDW: 15.8 % — ABNORMAL HIGH (ref 11.2–14.5)
WBC: 6.2 10*3/uL (ref 3.9–10.3)
lymph#: 1.7 10*3/uL (ref 0.9–3.3)

## 2014-11-07 MED ORDER — PROCHLORPERAZINE MALEATE 10 MG PO TABS: ORAL_TABLET | ORAL | Status: DC

## 2014-11-07 NOTE — Progress Notes (Signed)
[place]  [phone] [phone]     ID: Darlene [name] DOB: September 13, 1950  MR#: [id number]  ZWC#:[id number]  Patient Care Team: Thressa Sheller, MD as PCP - General (Internal Medicine) Bryson Ha Himmelrich, RD as Dietitian (Bariatrics) Stark Klein, MD as Consulting Physician (General Surgery) Chauncey Cruel, MD as Consulting Physician (Oncology) Arloa Koh, MD as Consulting Physician (Radiation Oncology) Mauro Kaufmann, RN as Registered Nurse Rockwell Germany, RN as Registered Nurse Jacelyn Pi, MD as Consulting Physician (Endocrinology) PCP: Thressa Sheller, MD GYN: Kem Boroughs FNP OTHER MD:  CHIEF COMPLAINT:  HER-2 positive , estrogen receptor negative breast cancer  CURRENT TREATMENT:  Neoadjuvant chemotherapy with anti-HER-2 treatment   BREAST CANCER HISTORY: From the original intake note:  "Darlene [name]" had screening mammography at her gynecologist suggestive of a possible abnormality in the right breast. However right diagnostic mammogram 04/12/2013 at [facility] was negative. More recently however, she noted a lump in her right breast and brought it to her gynecologist's attention. On 07/18/2014 the patient had bilateral diagnostic mammography with tomosynthesis at [facility]. The breast density was category B. In the right breast upper outer quadrant there was an area of focal asymmetry with indistinct margins.[name] Ultrasound was obtained and showed in addition to the mass in question, measuring 1.7 cm, an abnormal-appearing lymph node in the right axillary tail.  On 07/19/2014 the patient underwent biopsy of the right breast massin question as well as a right axillary lymph node. Both were positive for invasive ductal carcinoma, grade 2, estrogen and progesterone receptor negative, with an MIB-1 of 90%, and HER-2 pending. Incidentally the lymph node biopsy showed a lymphocytic inflammatory component but no lymph node architecture.  Her subsequent  history is as detailed below.  INTERVAL HISTORY:  Darlene [name] returns today for follow-up of her breast cancer, accompanied by her husband [name]. Today is day 8, cycle 5 of 6 planned cycles of of carboplatin, docetaxel, trastuzumab and pertuzumab given every 3 weeks, with neulasta given on day 2.  REVIEW OF SYSTEMS: Darlene [name] stayed remarkably hydrated this past cycle. She was scheduled for IV fluids in advance and received them, but felt like she could have managed on her own. This partially because her diarrhea was under excellent control with questran powder and imodium. She was able to keep fluids down and supplemented this with low calorie gatorade. She denies fevers or chills. Her nausea is minimal. Her appetite is fair. She denies mouth sores or rashes. The baseline neuropathy to her bilateral feet is unchanged. She feels weaker and more fatigued lately. A detailed review of systems is otherwise stable.  PAST MEDICAL HISTORY: Past Medical History  Diagnosis Date  . Obesity   . Hypertension   . Hyperlipidemia   . Obstructive sleep apnea on CPAP   . Diabetes mellitus without complication ([place])     23+ years  . Eating disorder     binge eating  . Balance problem   . Arthritis     in fingers, shoulders  . Anemia     hx of  . Depression   . Ovarian cyst rupture     possible  . Clumsiness   . Breast cancer of upper-outer quadrant of right female breast ([place]) 07/22/2014  . Breast cancer ([place])   . Anxiety   . Neuromuscular disorder ([place])     PAST SURGICAL HISTORY: Past Surgical History  Procedure Laterality Date  . Carpal tunnel release Right   . Carpal tunnel release Left   .  Breath tek h pylori N/A 09/15/2012    Procedure: BREATH TEK H PYLORI;  Surgeon: Pedro Earls, MD;  Location: [facility];  Service: General;  Laterality: N/A;  . Dupuytren contracture release    . Trigger finger release      x3  . Toe surgery  1970s    bone spur  . Rotator cuff repair Right 2005  . Gastric  roux-en-y N/A 11/02/2012    Procedure: LAPAROSCOPIC ROUX-EN-Y GASTRIC;  Surgeon: Pedro Earls, MD;  Location: [facility];  Service: General;  Laterality: N/A;  . Bunionectomy Left 12/2013  . Portacath placement Left 08/02/2014    Procedure: INSERTION PORT-A-CATH;  Surgeon: Stark Klein, MD;  Location: [facility];  Service: General;  Laterality: Left;    FAMILY HISTORY Family History  Problem Relation Age of Onset  . CVA Mother   . Diabetes Father   . Heart failure Father   . Hypertension Sister   . Diabetes Brother   . Diabetes Paternal Uncle    the patient's father died from complications of diabetes at age 53. The patient's mother died at age 5 with a subarachnoid hemorrhage. The patient had one brother, one sister. The only breast cancer was the patient's paternal grandmother diagnosed in her 93s. There is no history of ovarian cancer in the family.  GYNECOLOGIC HISTORY:  Patient's last menstrual period was 10/22/2007.  menarche age 39, the patient is GX P0. She went through the change of life in 2011. She did not take hormone replacement.  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 Her husband [name] is a retired Visual merchandiser (he taught at [employer]).  [name] has 2 children from an earlier marriage, Darlene [name] who teaches in [place], and Darlene [name],  who works at the [employer] in in [place]. He has 1 grand son, aged 20. The patient and her husband are not church attender's    ADVANCED DIRECTIVES:  Not in place   HEALTH MAINTENANCE: Social History  Substance Use Topics  . Smoking status: Never Smoker   . Smokeless tobacco: Never Used  . Alcohol Use: 0.0 oz/week    0 Standard drinks or equivalent per week     Colonoscopy:  May 2016  PAP:  Bone density: 07/05/2013 normal, with a T score of 0.4  Lipid panel:  Allergies  Allergen Reactions  . Other Itching    Peanuts  in large quantities     Current Outpatient Prescriptions  Medication Sig Dispense Refill  . acetaminophen (TYLENOL) 500 MG tablet Take 500 mg by mouth every 6 (six) hours as needed for pain.    [name] aspirin 81 MG tablet Take 81 mg by mouth daily.      . Calcium Citrate-Vitamin D (CALCIUM CITRATE+D3 PO) Take 1,500 mg by mouth daily.    . cholestyramine (QUESTRAN) 4 G packet Take 1 packet (4 g total) by mouth 2 (two) times daily. 60 each 1  . dexamethasone (DECADRON) 4 MG tablet Take 2 tablets twice daily with food the day BEFORE chemo. 30 tablet 1  . diphenoxylate-atropine (LOMOTIL) 2.5-0.025 MG per tablet Take 1 tablet by mouth 4 (four) times daily as needed for diarrhea or loose stools. 60 tablet 1  . glucose blood (ONETOUCH VERIO) test strip 1 each by Other route as needed for other. Use as instructed    . lidocaine-prilocaine (EMLA) cream Apply to affected area once 30 g 3  .  LORazepam (ATIVAN) 0.5 MG tablet Take 1 tablet (0.5 mg total) by mouth at bedtime. 30 tablet 0  . losartan (COZAAR) 100 MG tablet Take 1 tablet by mouth daily.     . mometasone (NASONEX) 50 MCG/ACT nasal spray Place 2 sprays into the nose daily as needed.      . Multiple Vitamin (MULTIVITAMIN WITH MINERALS) TABS tablet Take 1 tablet by mouth 2 (two) times daily. With iron    . NOVOLIN 70/30 (70-30) 100 UNIT/ML injection 10 units two times a day    . ondansetron (ZOFRAN) 8 MG tablet Take 1 tablet (8 mg total) by mouth 2 (two) times daily. Start the day after chemo for 3 days. Then take as needed for nausea or vomiting. 30 tablet 1  . Probiotic Product ([employer] PROBIOTIC PO) Take 1 capsule by mouth daily.    [name] tobramycin-dexamethasone (TOBRADEX) ophthalmic solution Place 1 drop into both eyes 2 (two) times daily. 5 mL 1  . Vortioxetine HBr (BRINTELLIX) 20 MG TABS Take 20 mg by mouth daily. 90 tablet 0  . Docusate Sodium (COLACE PO) Take 1 capsule by mouth 2 (two) times daily.     . prochlorperazine (COMPAZINE) 10 MG tablet Take  one table before meals and at bedtime (4 times a day) starint the evening of chemo and continuing for 3 full days 30 tablet 1   No current facility-administered medications for this visit.    OBJECTIVE:  Middle-aged white woman who appears stated age 30 Vitals:   11/07/14 1411  BP: 130/48  Pulse: 87  Temp: 98.3 F (36.8 C)  Resp: 19     Body mass index is 32.09 kg/(m^2).    ECOG FS:1 - Symptomatic but completely ambulatory  Sclerae unicteric, pupils round and equal Oropharynx clear and moist-- no thrush or other lesions No cervical or supraclavicular adenopathy Lungs no rales or rhonchi Heart regular rate and rhythm Abd soft, nontender, positive bowel sounds MSK no focal spinal tenderness, no upper extremity lymphedema Neuro: nonfocal, well oriented, appropriate affect Breasts: deferred  LAB RESULTS:  CMP     Component Value Date/Time   NA 142 11/07/2014 1343   NA 140 02/18/2013 0827   K 4.5 11/07/2014 1343   K 4.3 02/18/2013 0827   CL 104 02/18/2013 0827   CO2 28 11/07/2014 1343   CO2 25 02/18/2013 0827   GLUCOSE 158* 11/07/2014 1343   GLUCOSE 168* 02/18/2013 0827   BUN 13.2 11/07/2014 1343   BUN 26* 02/18/2013 0827   CREATININE 1.1 11/07/2014 1343   CREATININE 1.10 02/18/2013 0827   CREATININE 1.37* 11/02/2012 1230   CALCIUM 8.9 11/07/2014 1343   CALCIUM 9.5 02/18/2013 0827   PROT 5.7* 11/07/2014 1343   PROT 6.3 02/18/2013 0827   ALBUMIN 3.3* 11/07/2014 1343   ALBUMIN 3.9 02/18/2013 0827   AST 23 11/07/2014 1343   AST 23 02/18/2013 0827   ALT 23 11/07/2014 1343   ALT 36* 02/18/2013 0827   ALKPHOS 90 11/07/2014 1343   ALKPHOS 68 02/18/2013 0827   BILITOT 0.62 11/07/2014 1343   BILITOT 0.9 02/18/2013 0827   GFRNONAA 55* 02/18/2013 0827   GFRNONAA 41* 11/02/2012 1230   GFRAA 63 02/18/2013 0827   GFRAA 48* 11/02/2012 1230    INo results found for: SPEP, UPEP  Lab Results  Component Value Date   WBC 6.2 11/07/2014   NEUTROABS 3.2 11/07/2014   HGB  9.0* 11/07/2014   HCT 27.6* 11/07/2014   MCV 97.2 11/07/2014   PLT 145  11/07/2014      Chemistry      Component Value Date/Time   NA 142 11/07/2014 1343   NA 140 02/18/2013 0827   K 4.5 11/07/2014 1343   K 4.3 02/18/2013 0827   CL 104 02/18/2013 0827   CO2 28 11/07/2014 1343   CO2 25 02/18/2013 0827   BUN 13.2 11/07/2014 1343   BUN 26* 02/18/2013 0827   CREATININE 1.1 11/07/2014 1343   CREATININE 1.10 02/18/2013 0827   CREATININE 1.37* 11/02/2012 1230      Component Value Date/Time   CALCIUM 8.9 11/07/2014 1343   CALCIUM 9.5 02/18/2013 0827   ALKPHOS 90 11/07/2014 1343   ALKPHOS 68 02/18/2013 0827   AST 23 11/07/2014 1343   AST 23 02/18/2013 0827   ALT 23 11/07/2014 1343   ALT 36* 02/18/2013 0827   BILITOT 0.62 11/07/2014 1343   BILITOT 0.9 02/18/2013 0827       No results found for: LABCA2  No components found for: LABCA125  No results for input(s): INR in the last 168 hours.  Urinalysis    Component Value Date/Time   BILIRUBINUR neg 06/29/2013 1017   PROTEINUR trace 06/29/2013 1017   UROBILINOGEN negative 06/29/2013 1017   NITRITE neg 06/29/2013 1017   LEUKOCYTESUR Negative 06/29/2013 1017    STUDIES: No results found.  ASSESSMENT: 64 y.o. [place] woman status post right breast and right axillary lymph node biopsy 07/19/2014 for a cT2  pN1, stage IIB  invasive ductal carcinoma, grade 2, estrogen and progesterone receptor negative, with an MIB-1 of 90%, and HER-2 amplified  (1) neoadjuvant chemotherapy started 08/08/2014, consisting of carboplatin, docetaxel, trastuzumab and pertuzumab given every 3 weeks 6  (a) breast MRI after initial 3 cycles shows a significant response  (2) trastuzumab to be continued to complete one year  (a) echo 07/29/2014 shows a normal ejection fraction  (3) Definitive surgery to follow chemotherapy, with consideration of the [facility] trial  (4) adjuvant radiation to follow surgery   PLAN: Darlene [name] is managing treatment  well. The labs were reviewed in detail and were stable. Her treatment related anemia is unchanged. She is opting to forego prescheduled IV fluids with her next cycle.   She is scheduled for a repeat echocardiogram tomorrow.   Darlene [name] will return in 2 weeks for her 6th and final cycle of chemotherapy. She understands and agrees with this plan. She knows the goal of treatment in her case is cure. She has been encouraged to call with any issues that might arise before her next visit here.   Laurie Panda, NP   11/07/2014 2:55 PM

## 2014-11-08 ENCOUNTER — Ambulatory Visit (HOSPITAL_COMMUNITY)
Admission: RE | Admit: 2014-11-08 | Discharge: 2014-11-08 | Disposition: A | Discharge status: Home / Self Care | Payer: BLUE CROSS/BLUE SHIELD | Source: Ambulatory Visit | Attending: Nurse Practitioner | Admitting: Nurse Practitioner

## 2014-11-08 ENCOUNTER — Telehealth: Payer: Self-pay | Admitting: Nurse Practitioner

## 2014-11-08 DIAGNOSIS — E860 Dehydration: Secondary | ICD-10-CM | POA: Diagnosis not present

## 2014-11-08 DIAGNOSIS — C50411 Malignant neoplasm of upper-outer quadrant of right female breast: Secondary | ICD-10-CM

## 2014-11-08 DIAGNOSIS — E785 Hyperlipidemia, unspecified: Secondary | ICD-10-CM | POA: Diagnosis not present

## 2014-11-08 DIAGNOSIS — E119 Type 2 diabetes mellitus without complications: Secondary | ICD-10-CM | POA: Insufficient documentation

## 2014-11-08 NOTE — Progress Notes (Signed)
*  PRELIMINARY RESULTS* Echocardiogram 2D Echocardiogram has been performed.  Leavy Cella 11/08/2014, 9:34 AM

## 2014-11-08 NOTE — Telephone Encounter (Signed)
Called and left a message with appointments °

## 2014-11-09 ENCOUNTER — Other Ambulatory Visit: Payer: Self-pay | Admitting: Oncology

## 2014-11-21 ENCOUNTER — Encounter: Payer: Self-pay | Admitting: *Deleted

## 2014-11-21 ENCOUNTER — Other Ambulatory Visit (HOSPITAL_BASED_OUTPATIENT_CLINIC_OR_DEPARTMENT_OTHER): Payer: BLUE CROSS/BLUE SHIELD

## 2014-11-21 ENCOUNTER — Ambulatory Visit: Payer: BLUE CROSS/BLUE SHIELD

## 2014-11-21 ENCOUNTER — Ambulatory Visit (HOSPITAL_BASED_OUTPATIENT_CLINIC_OR_DEPARTMENT_OTHER): Payer: BLUE CROSS/BLUE SHIELD

## 2014-11-21 ENCOUNTER — Ambulatory Visit (HOSPITAL_BASED_OUTPATIENT_CLINIC_OR_DEPARTMENT_OTHER): Payer: BLUE CROSS/BLUE SHIELD | Admitting: Nurse Practitioner

## 2014-11-21 ENCOUNTER — Encounter: Payer: Self-pay | Admitting: Nurse Practitioner

## 2014-11-21 ENCOUNTER — Other Ambulatory Visit: Payer: Self-pay | Admitting: Nurse Practitioner

## 2014-11-21 VITALS — BP 139/57 | HR 72 | Temp 97.8°F | Resp 18 | Wt 216.1 lb

## 2014-11-21 DIAGNOSIS — C773 Secondary and unspecified malignant neoplasm of axilla and upper limb lymph nodes: Secondary | ICD-10-CM

## 2014-11-21 DIAGNOSIS — Z5112 Encounter for antineoplastic immunotherapy: Secondary | ICD-10-CM

## 2014-11-21 DIAGNOSIS — Z5111 Encounter for antineoplastic chemotherapy: Secondary | ICD-10-CM

## 2014-11-21 DIAGNOSIS — C50411 Malignant neoplasm of upper-outer quadrant of right female breast: Secondary | ICD-10-CM

## 2014-11-21 DIAGNOSIS — Z95828 Presence of other vascular implants and grafts: Secondary | ICD-10-CM

## 2014-11-21 DIAGNOSIS — Z171 Estrogen receptor negative status [ER-]: Secondary | ICD-10-CM | POA: Diagnosis not present

## 2014-11-21 DIAGNOSIS — R197 Diarrhea, unspecified: Secondary | ICD-10-CM

## 2014-11-21 LAB — COMPREHENSIVE METABOLIC PANEL (CC13)
ALT: 32 U/L (ref 0–55)
AST: 32 U/L (ref 5–34)
Albumin: 3.2 g/dL — ABNORMAL LOW (ref 3.5–5.0)
Alkaline Phosphatase: 81 U/L (ref 40–150)
Anion Gap: 6 mEq/L (ref 3–11)
BUN: 18.5 mg/dL (ref 7.0–26.0)
CO2: 26 mEq/L (ref 22–29)
Calcium: 8.7 mg/dL (ref 8.4–10.4)
Chloride: 111 mEq/L — ABNORMAL HIGH (ref 98–109)
Creatinine: 0.9 mg/dL (ref 0.6–1.1)
EGFR: 67 mL/min/{1.73_m2} — ABNORMAL LOW (ref >90)
Glucose: 180 mg/dl — ABNORMAL HIGH (ref 70–140)
Potassium: 4.1 mEq/L (ref 3.5–5.1)
Sodium: 143 mEq/L (ref 136–145)
Total Bilirubin: 0.57 mg/dL (ref 0.20–1.20)
Total Protein: 5.6 g/dL — ABNORMAL LOW (ref 6.4–8.3)

## 2014-11-21 LAB — CBC WITH DIFFERENTIAL/PLATELET
BASO%: 0.6 % (ref 0.0–2.0)
Basophils Absolute: 0 10*3/uL (ref 0.0–0.1)
EOS%: 1.3 % (ref 0.0–7.0)
Eosinophils Absolute: 0.1 10*3/uL (ref 0.0–0.5)
HCT: 28.6 % — ABNORMAL LOW (ref 34.8–46.6)
HGB: 9.5 g/dL — ABNORMAL LOW (ref 11.6–15.9)
LYMPH%: 19.1 % (ref 14.0–49.7)
MCH: 31.8 pg (ref 25.1–34.0)
MCHC: 33.2 g/dL (ref 31.5–36.0)
MCV: 95.6 fL (ref 79.5–101.0)
MONO#: 0.4 10*3/uL (ref 0.1–0.9)
MONO%: 6.6 % (ref 0.0–14.0)
NEUT#: 4.9 10*3/uL (ref 1.5–6.5)
NEUT%: 72.4 % (ref 38.4–76.8)
Platelets: 227 10*3/uL (ref 145–400)
RBC: 2.99 10*6/uL — ABNORMAL LOW (ref 3.70–5.45)
RDW: 16.7 % — ABNORMAL HIGH (ref 11.2–14.5)
WBC: 6.7 10*3/uL (ref 3.9–10.3)
lymph#: 1.3 10*3/uL (ref 0.9–3.3)

## 2014-11-21 MED ORDER — SODIUM CHLORIDE 0.9 % IV SOLN
550.0000 mg | Freq: Once | INTRAVENOUS | Status: AC
  Administered 2014-11-21: 550 mg via INTRAVENOUS
  Filled 2014-11-21: qty 55

## 2014-11-21 MED ORDER — DIPHENHYDRAMINE HCL 25 MG PO CAPS
25.0000 mg | ORAL_CAPSULE | Freq: Once | ORAL | Status: AC
  Administered 2014-11-21: 25 mg via ORAL

## 2014-11-21 MED ORDER — SODIUM CHLORIDE 0.9 % IV SOLN
Freq: Once | INTRAVENOUS | Status: AC
  Administered 2014-11-21: 13:00:00 via INTRAVENOUS
  Filled 2014-11-21: qty 8

## 2014-11-21 MED ORDER — TRASTUZUMAB CHEMO INJECTION 440 MG
6.0000 mg/kg | Freq: Once | INTRAVENOUS | Status: AC
  Administered 2014-11-21: 609 mg via INTRAVENOUS
  Filled 2014-11-21: qty 29

## 2014-11-21 MED ORDER — SODIUM CHLORIDE 0.9 % IV SOLN
Freq: Once | INTRAVENOUS | Status: AC
  Administered 2014-11-21: 11:00:00 via INTRAVENOUS

## 2014-11-21 MED ORDER — DIPHENHYDRAMINE HCL 25 MG PO CAPS
ORAL_CAPSULE | ORAL | Status: AC
  Filled 2014-11-21: qty 1

## 2014-11-21 MED ORDER — SODIUM CHLORIDE 0.9 % IJ SOLN
10.0000 mL | INTRAMUSCULAR | Status: DC | PRN
  Administered 2014-11-21: 10 mL
  Filled 2014-11-21: qty 10

## 2014-11-21 MED ORDER — SODIUM CHLORIDE 0.9 % IJ SOLN
10.0000 mL | INTRAMUSCULAR | Status: DC | PRN
  Administered 2014-11-21: 10 mL via INTRAVENOUS
  Filled 2014-11-21: qty 10

## 2014-11-21 MED ORDER — ACETAMINOPHEN 325 MG PO TABS
650.0000 mg | ORAL_TABLET | Freq: Once | ORAL | Status: AC
  Administered 2014-11-21: 650 mg via ORAL

## 2014-11-21 MED ORDER — PEGFILGRASTIM 6 MG/0.6ML ~~LOC~~ PSKT
6.0000 mg | PREFILLED_SYRINGE | Freq: Once | SUBCUTANEOUS | Status: DC
  Filled 2014-11-21: qty 0.6

## 2014-11-21 MED ORDER — SODIUM CHLORIDE 0.9 % IV SOLN
75.0000 mg/m2 | Freq: Once | INTRAVENOUS | Status: AC
  Administered 2014-11-21: 170 mg via INTRAVENOUS
  Filled 2014-11-21: qty 17

## 2014-11-21 MED ORDER — SODIUM CHLORIDE 0.9 % IV SOLN
420.0000 mg | Freq: Once | INTRAVENOUS | Status: AC
  Administered 2014-11-21: 420 mg via INTRAVENOUS
  Filled 2014-11-21: qty 14

## 2014-11-21 MED ORDER — ACETAMINOPHEN 325 MG PO TABS
ORAL_TABLET | ORAL | Status: AC
  Filled 2014-11-21: qty 2

## 2014-11-21 MED ORDER — HEPARIN SOD (PORK) LOCK FLUSH 100 UNIT/ML IV SOLN
500.0000 [IU] | Freq: Once | INTRAVENOUS | Status: AC | PRN
  Administered 2014-11-21: 500 [IU]
  Filled 2014-11-21: qty 5

## 2014-11-21 NOTE — Patient Instructions (Signed)
[facility] Discharge Instructions for Patients Receiving Chemotherapy  Today you received the following chemotherapy agents herceptin, perjeta, taxotere, carboplatin  To help prevent nausea and vomiting after your treatment, we encourage you to take your nausea medication    If you develop nausea and vomiting that is not controlled by your nausea medication, call the clinic.   BELOW ARE SYMPTOMS THAT SHOULD BE REPORTED IMMEDIATELY:  *FEVER GREATER THAN 100.5 F  *CHILLS WITH OR WITHOUT FEVER  NAUSEA AND VOMITING THAT IS NOT CONTROLLED WITH YOUR NAUSEA MEDICATION  *UNUSUAL SHORTNESS OF BREATH  *UNUSUAL BRUISING OR BLEEDING  TENDERNESS IN MOUTH AND THROAT WITH OR WITHOUT PRESENCE OF ULCERS  *URINARY PROBLEMS  *BOWEL PROBLEMS  UNUSUAL RASH Items with * indicate a potential emergency and should be followed up as soon as possible.  Feel free to call the clinic you have any questions or concerns. The clinic phone number is [phone].  Please show the [place] [facility] at check-in to the Emergency Department and triage nurse.

## 2014-11-21 NOTE — Progress Notes (Signed)
[place]  [phone] [phone]     ID: CORTASIA SCREWS DOB: Feb 13, 1950  MR#: [id number]  BRA#:[id number]  Patient Care Team: Thressa Sheller, MD as PCP - General (Internal Medicine) Bryson Ha Himmelrich, RD as Dietitian (Bariatrics) Stark Klein, MD as Consulting Physician (General Surgery) Chauncey Cruel, MD as Consulting Physician (Oncology) Arloa Koh, MD as Consulting Physician (Radiation Oncology) Mauro Kaufmann, RN as Registered Nurse Rockwell Germany, RN as Registered Nurse Jacelyn Pi, MD as Consulting Physician (Endocrinology) PCP: Thressa Sheller, MD GYN: Kem Boroughs FNP OTHER MD:  CHIEF COMPLAINT:  HER-2 positive , estrogen receptor negative breast cancer  CURRENT TREATMENT:  Neoadjuvant chemotherapy with anti-HER-2 treatment   BREAST CANCER HISTORY: From the original intake note:  "Darlene [name]" had screening mammography at her gynecologist suggestive of a possible abnormality in the right breast. However right diagnostic mammogram 04/12/2013 at [facility] was negative. More recently however, she noted a lump in her right breast and brought it to her gynecologist's attention. On 07/18/2014 the patient had bilateral diagnostic mammography with tomosynthesis at [facility]. The breast density was category B. In the right breast upper outer quadrant there was an area of focal asymmetry with indistinct margins.[name] Ultrasound was obtained and showed in addition to the mass in question, measuring 1.7 cm, an abnormal-appearing lymph node in the right axillary tail.  On 07/19/2014 the patient underwent biopsy of the right breast massin question as well as a right axillary lymph node. Both were positive for invasive ductal carcinoma, grade 2, estrogen and progesterone receptor negative, with an MIB-1 of 90%, and HER-2 pending. Incidentally the lymph node biopsy showed a lymphocytic inflammatory component but no lymph node architecture.  Her subsequent  history is as detailed below.  INTERVAL HISTORY:  Darlene [name] returns today for follow-up of her breast cancer, accompanied by her husband [name]. Today is day 1, cycle 6 of 6 planned cycles of of carboplatin, docetaxel, trastuzumab and pertuzumab given every 3 weeks, with neulasta given on day 2.  REVIEW OF SYSTEMS: Darlene [name] is having more trouble controlling her bowels this past week. She is having 2-3 loose stools daily. Admittedly, the questran powder was initial her "last resort" medicine, but she has figured out that it works better than the imodium or lomotil. She is staying reasonably hydrated. She denies fevers, chills, nausea, or vomiting. She has no mouth sores or rashes. The baseline neuropathy to her bilateral feet is unchanged. She is fatigued. A detailed review of systems is otherwise stable.  PAST MEDICAL HISTORY: Past Medical History  Diagnosis Date  . Obesity   . Hypertension   . Hyperlipidemia   . Obstructive sleep apnea on CPAP   . Diabetes mellitus without complication ([place])     64 years  . Eating disorder     binge eating  . Balance problem   . Arthritis     in fingers, shoulders  . Anemia     hx of  . Depression   . Ovarian cyst rupture     possible  . Clumsiness   . Breast cancer of upper-outer quadrant of right female breast ([place]) 07/22/2014  . Breast cancer ([place])   . Anxiety   . Neuromuscular disorder ([place])     PAST SURGICAL HISTORY: Past Surgical History  Procedure Laterality Date  . Carpal tunnel release Right   . Carpal tunnel release Left   . Breath tek h pylori N/A 09/15/2012    Procedure: BREATH TEK H PYLORI;  Surgeon: Isabel Caprice  Hassell Done, MD;  Location: [facility];  Service: General;  Laterality: N/A;  . Dupuytren contracture release    . Trigger finger release      x3  . Toe surgery  1970s    bone spur  . Rotator cuff repair Right 2005  . Gastric roux-en-y N/A 11/02/2012    Procedure: LAPAROSCOPIC ROUX-EN-Y GASTRIC;  Surgeon: Pedro Earls, MD;   Location: [facility];  Service: General;  Laterality: N/A;  . Bunionectomy Left 12/2013  . Portacath placement Left 08/02/2014    Procedure: INSERTION PORT-A-CATH;  Surgeon: Stark Klein, MD;  Location: [facility];  Service: General;  Laterality: Left;    FAMILY HISTORY Family History  Problem Relation Age of Onset  . CVA Mother   . Diabetes Father   . Heart failure Father   . Hypertension Sister   . Diabetes Brother   . Diabetes Paternal Uncle    the patient's father died from complications of diabetes at age 48. The patient's mother died at age 16 with a subarachnoid hemorrhage. The patient had one brother, one sister. The only breast cancer was the patient's paternal grandmother diagnosed in her 46s. There is no history of ovarian cancer in the family.  GYNECOLOGIC HISTORY:  Patient's last menstrual period was 10/22/2007.  menarche age 39, the patient is GX P0. She went through the change of life in 2011. She did not take hormone replacement.  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 Her husband [name] is a retired Visual merchandiser (he taught at [employer]).  [name] has 2 children from an earlier marriage, Rockey Situ who teaches in [place], and Mckinleigh Schuchart,  who works at the [employer] in in [place]. He has 1 grand son, aged 30. The patient and her husband are not church attender's    ADVANCED DIRECTIVES:  Not in place   HEALTH MAINTENANCE: Social History  Substance Use Topics  . Smoking status: Never Smoker   . Smokeless tobacco: Never Used  . Alcohol Use: 0.0 oz/week    0 Standard drinks or equivalent per week     Colonoscopy:  May 2016  PAP:  Bone density: 07/05/2013 normal, with a T score of 0.4  Lipid panel:  Allergies  Allergen Reactions  . Other Itching    Peanuts in large quantities     Current Outpatient Prescriptions  Medication Sig Dispense Refill  .  acetaminophen (TYLENOL) 500 MG tablet Take 500 mg by mouth every 6 (six) hours as needed for pain.    [name] aspirin 81 MG tablet Take 81 mg by mouth daily.      . Calcium Citrate-Vitamin D (CALCIUM CITRATE+D3 PO) Take 1,500 mg by mouth daily.    . cholestyramine (QUESTRAN) 4 G packet Take 1 packet (4 g total) by mouth 2 (two) times daily. 60 each 1  . diphenoxylate-atropine (LOMOTIL) 2.5-0.025 MG per tablet Take 1 tablet by mouth 4 (four) times daily as needed for diarrhea or loose stools. 60 tablet 1  . glucose blood (ONETOUCH VERIO) test strip 1 each by Other route as needed for other. Use as instructed    . lidocaine-prilocaine (EMLA) cream Apply to affected area once 30 g 3  . LORazepam (ATIVAN) 0.5 MG tablet Take 1 tablet (0.5 mg total) by mouth at bedtime. 30 tablet 0  . losartan (COZAAR) 100 MG tablet Take 1 tablet by mouth daily.     . Multiple  Vitamin (MULTIVITAMIN WITH MINERALS) TABS tablet Take 1 tablet by mouth 2 (two) times daily. With iron    . NOVOLIN 70/30 (70-30) 100 UNIT/ML injection 10 units two times a day    . ondansetron (ZOFRAN) 8 MG tablet TAKE 1 TABLET TWICE DAILY. START DAY AFTER CHEMO FOR 3 DAYS. THEN AS NEEDED. 30 tablet 0  . Probiotic Product ([employer] PROBIOTIC PO) Take 1 capsule by mouth daily.    . prochlorperazine (COMPAZINE) 10 MG tablet Take one table before meals and at bedtime (4 times a day) starint the evening of chemo and continuing for 3 full days 30 tablet 1  . tobramycin-dexamethasone (TOBRADEX) ophthalmic solution Place 1 drop into both eyes 2 (two) times daily. 5 mL 1  . Vortioxetine HBr (BRINTELLIX) 20 MG TABS Take 20 mg by mouth daily. 90 tablet 0  . dexamethasone (DECADRON) 4 MG tablet Take 2 tablets twice daily with food the day BEFORE chemo. (Patient not taking: Reported on 11/21/2014) 30 tablet 1  . Docusate Sodium (COLACE PO) Take 1 capsule by mouth 2 (two) times daily.     . mometasone (NASONEX) 50 MCG/ACT nasal spray Place 2 sprays into the nose daily as  needed.       No current facility-administered medications for this visit.   Facility-Administered Medications Ordered in Other Visits  Medication Dose Route Frequency Provider Last Rate Last Dose  . sodium chloride 0.9 % injection 10 mL  10 mL Intravenous PRN Chauncey Cruel, MD   10 mL at 11/21/14 6546    OBJECTIVE:  Middle-aged white woman who appears stated age There were no vitals filed for this visit.   There is no weight on file to calculate BMI.    ECOG FS:1 - Symptomatic but completely ambulatory  Skin: warm, dry  HEENT: sclerae anicteric, conjunctivae pink, oropharynx clear. No thrush or mucositis.  Lymph Nodes: No cervical or supraclavicular lymphadenopathy  Lungs: clear to auscultation bilaterally, no rales, wheezes, or rhonci  Heart: regular rate and rhythm  Abdomen: round, soft, non tender, positive bowel sounds  Musculoskeletal: No focal spinal tenderness, no peripheral edema  Neuro: non focal, well oriented, positive affect  Breasts: deferred  LAB RESULTS:  CMP     Component Value Date/Time   NA 143 11/21/2014 0915   NA 140 02/18/2013 0827   K 4.1 11/21/2014 0915   K 4.3 02/18/2013 0827   CL 104 02/18/2013 0827   CO2 26 11/21/2014 0915   CO2 25 02/18/2013 0827   GLUCOSE 180* 11/21/2014 0915   GLUCOSE 168* 02/18/2013 0827   BUN 18.5 11/21/2014 0915   BUN 26* 02/18/2013 0827   CREATININE 0.9 11/21/2014 0915   CREATININE 1.10 02/18/2013 0827   CREATININE 1.37* 11/02/2012 1230   CALCIUM 8.7 11/21/2014 0915   CALCIUM 9.5 02/18/2013 0827   PROT 5.6* 11/21/2014 0915   PROT 6.3 02/18/2013 0827   ALBUMIN 3.2* 11/21/2014 0915   ALBUMIN 3.9 02/18/2013 0827   AST 32 11/21/2014 0915   AST 23 02/18/2013 0827   ALT 32 11/21/2014 0915   ALT 36* 02/18/2013 0827   ALKPHOS 81 11/21/2014 0915   ALKPHOS 68 02/18/2013 0827   BILITOT 0.57 11/21/2014 0915   BILITOT 0.9 02/18/2013 0827   GFRNONAA 55* 02/18/2013 0827   GFRNONAA 41* 11/02/2012 1230   GFRAA 63  02/18/2013 0827   GFRAA 48* 11/02/2012 1230    INo results found for: SPEP, UPEP  Lab Results  Component Value Date   WBC 6.7 11/21/2014  NEUTROABS 4.9 11/21/2014   HGB 9.5* 11/21/2014   HCT 28.6* 11/21/2014   MCV 95.6 11/21/2014   PLT 227 11/21/2014      Chemistry      Component Value Date/Time   NA 143 11/21/2014 0915   NA 140 02/18/2013 0827   K 4.1 11/21/2014 0915   K 4.3 02/18/2013 0827   CL 104 02/18/2013 0827   CO2 26 11/21/2014 0915   CO2 25 02/18/2013 0827   BUN 18.5 11/21/2014 0915   BUN 26* 02/18/2013 0827   CREATININE 0.9 11/21/2014 0915   CREATININE 1.10 02/18/2013 0827   CREATININE 1.37* 11/02/2012 1230      Component Value Date/Time   CALCIUM 8.7 11/21/2014 0915   CALCIUM 9.5 02/18/2013 0827   ALKPHOS 81 11/21/2014 0915   ALKPHOS 68 02/18/2013 0827   AST 32 11/21/2014 0915   AST 23 02/18/2013 0827   ALT 32 11/21/2014 0915   ALT 36* 02/18/2013 0827   BILITOT 0.57 11/21/2014 0915   BILITOT 0.9 02/18/2013 0827       No results found for: LABCA2  No components found for: LABCA125  No results for input(s): INR in the last 168 hours.  Urinalysis    Component Value Date/Time   BILIRUBINUR neg 06/29/2013 1017   PROTEINUR trace 06/29/2013 1017   UROBILINOGEN negative 06/29/2013 1017   NITRITE neg 06/29/2013 1017   LEUKOCYTESUR Negative 06/29/2013 1017    STUDIES: No results found.  ASSESSMENT: 64 y.o. [place] woman status post right breast and right axillary lymph node biopsy 07/19/2014 for a cT2  pN1, stage IIB  invasive ductal carcinoma, grade 2, estrogen and progesterone receptor negative, with an MIB-1 of 90%, and HER-2 amplified  (1) neoadjuvant chemotherapy started 08/08/2014, consisting of carboplatin, docetaxel, trastuzumab and pertuzumab given every 3 weeks 6  (a) breast MRI after initial 3 cycles shows a significant response  (2) trastuzumab to be continued to complete one year  (a) echo 11/08/2014 shows and EF of 66%  (3)  Definitive surgery to follow chemotherapy, with consideration of the Alliance trial  (4) adjuvant radiation to follow surgery   PLAN: Darlene [name] is excited to be completing chemotherapy today. The labs were reviewed in detail and were stable. She will proceed with her 6th and final cycle of carboplatin, docetaxel, trastuzumab, and pertuzumab as planned today. She will return on Wednesday for 1L of NS and be more consistent with her use of the questran powder to control her diarrhea.  She will have her final breast MRI tomorrow. She meets with Dr. Barry Dienes later this week to discuss surgical plans.  Darlene [name] will return in 1 week for follow up with Dr. Jana Hakim.  She understands and agrees with this plan. She knows the goal of treatment in her case is cure. She has been encouraged to call with any issues that might arise before her next visit here.   Laurie Panda, NP   11/21/2014 10:40 AM

## 2014-11-21 NOTE — Progress Notes (Signed)
After applying onpro, pt stated that she was scheduled to have an MRI tomorrow. Onpro removed before needle stick was given and appointment made for neulasta injection tomorrow at 1530. Pt expressed understanding.

## 2014-11-22 ENCOUNTER — Ambulatory Visit
Admission: RE | Admit: 2014-11-22 | Discharge: 2014-11-22 | Disposition: A | Discharge status: Home / Self Care | Payer: BLUE CROSS/BLUE SHIELD | Source: Ambulatory Visit | Attending: Nurse Practitioner | Admitting: Nurse Practitioner

## 2014-11-22 DIAGNOSIS — C50411 Malignant neoplasm of upper-outer quadrant of right female breast: Secondary | ICD-10-CM

## 2014-11-22 DIAGNOSIS — J189 Pneumonia, unspecified organism: Secondary | ICD-10-CM

## 2014-11-22 HISTORY — DX: Pneumonia, unspecified organism: J18.9

## 2014-11-22 MED ORDER — GADOBENATE DIMEGLUMINE 529 MG/ML IV SOLN
20.0000 mL | Freq: Once | INTRAVENOUS | Status: AC | PRN
  Administered 2014-11-22: 20 mL via INTRAVENOUS

## 2014-11-23 ENCOUNTER — Ambulatory Visit: Payer: BLUE CROSS/BLUE SHIELD

## 2014-11-23 ENCOUNTER — Ambulatory Visit (HOSPITAL_BASED_OUTPATIENT_CLINIC_OR_DEPARTMENT_OTHER): Payer: BLUE CROSS/BLUE SHIELD

## 2014-11-23 VITALS — BP 140/52 | HR 77 | Temp 97.4°F | Resp 19

## 2014-11-23 DIAGNOSIS — C50411 Malignant neoplasm of upper-outer quadrant of right female breast: Secondary | ICD-10-CM | POA: Diagnosis not present

## 2014-11-23 DIAGNOSIS — Z5189 Encounter for other specified aftercare: Secondary | ICD-10-CM

## 2014-11-23 MED ORDER — SODIUM CHLORIDE 0.9 % IV SOLN
1000.0000 mL | Freq: Once | INTRAVENOUS | Status: AC
  Administered 2014-11-23: 1000 mL via INTRAVENOUS

## 2014-11-23 MED ORDER — PEGFILGRASTIM INJECTION 6 MG/0.6ML ~~LOC~~
6.0000 mg | PREFILLED_SYRINGE | Freq: Once | SUBCUTANEOUS | Status: AC
  Administered 2014-11-23: 6 mg via SUBCUTANEOUS
  Filled 2014-11-23: qty 0.6

## 2014-11-23 MED ORDER — SODIUM CHLORIDE 0.9 % IV SOLN
Freq: Once | INTRAVENOUS | Status: AC
  Administered 2014-11-23: 14:00:00 via INTRAVENOUS
  Filled 2014-11-23: qty 4

## 2014-11-23 MED ORDER — SODIUM CHLORIDE 0.9 % IJ SOLN
10.0000 mL | INTRAMUSCULAR | Status: DC | PRN
  Administered 2014-11-23: 10 mL
  Filled 2014-11-23: qty 10

## 2014-11-23 MED ORDER — HEPARIN SOD (PORK) LOCK FLUSH 100 UNIT/ML IV SOLN
500.0000 [IU] | Freq: Once | INTRAVENOUS | Status: AC | PRN
  Administered 2014-11-23: 500 [IU]
  Filled 2014-11-23: qty 5

## 2014-11-23 NOTE — Patient Instructions (Signed)
Dehydration, Adult Dehydration is when you lose more fluids from the body than you take in. Vital organs like the kidneys, brain, and heart cannot function without a proper amount of fluids and salt. Any loss of fluids from the body can cause dehydration.  CAUSES   Vomiting.  Diarrhea.  Excessive sweating.  Excessive urine output.  Fever. SYMPTOMS  Mild dehydration  Thirst.  Dry lips.  Slightly dry mouth. Moderate dehydration  Very dry mouth.  Sunken eyes.  Skin does not bounce back quickly when lightly pinched and released.  Dark urine and decreased urine production.  Decreased tear production.  Headache. Severe dehydration  Very dry mouth.  Extreme thirst.  Rapid, weak pulse (more than 100 beats per minute at rest).  Cold hands and feet.  Not able to sweat in spite of heat and temperature.  Rapid breathing.  Blue lips.  Confusion and lethargy.  Difficulty being awakened.  Minimal urine production.  No tears. DIAGNOSIS  Your caregiver will diagnose dehydration based on your symptoms and your exam. Blood and urine tests will help confirm the diagnosis. The diagnostic evaluation should also identify the cause of dehydration. TREATMENT  Treatment of mild or moderate dehydration can often be done at home by increasing the amount of fluids that you drink. It is best to drink small amounts of fluid more often. Drinking too much at one time can make vomiting worse. Refer to the home care instructions below. Severe dehydration needs to be treated at the hospital where you will probably be given intravenous (IV) fluids that contain water and electrolytes. HOME CARE INSTRUCTIONS   Ask your caregiver about specific rehydration instructions.  Drink enough fluids to keep your urine clear or pale yellow.  Drink small amounts frequently if you have nausea and vomiting.  Eat as you normally do.  Avoid:  Foods or drinks high in sugar.  Carbonated  drinks.  Juice.  Extremely hot or cold fluids.  Drinks with caffeine.  Fatty, greasy foods.  Alcohol.  Tobacco.  Overeating.  Gelatin desserts.  Wash your hands well to avoid spreading bacteria and viruses.  Only take over-the-counter or prescription medicines for pain, discomfort, or fever as directed by your caregiver.  Ask your caregiver if you should continue all prescribed and over-the-counter medicines.  Keep all follow-up appointments with your caregiver. SEEK MEDICAL CARE IF:  You have abdominal pain and it increases or stays in one area (localizes).  You have a rash, stiff neck, or severe headache.  You are irritable, sleepy, or difficult to awaken.  You are weak, dizzy, or extremely thirsty. SEEK IMMEDIATE MEDICAL CARE IF:   You are unable to keep fluids down or you get worse despite treatment.  You have frequent episodes of vomiting or diarrhea.  You have blood or green matter (bile) in your vomit.  You have blood in your stool or your stool looks black and tarry.  You have not urinated in 6 to 8 hours, or you have only urinated a small amount of very dark urine.  You have a fever.  You faint. MAKE SURE YOU:   Understand these instructions.  Will watch your condition.  Will get help right away if you are not doing well or get worse. Document Released: 01/07/2005 Document Revised: 04/01/2011 Document Reviewed: 08/27/2010 ExitCare Patient Information 2015 [employer]. This information is not intended to replace advice given to you by your health care provider. Make sure you discuss any questions you have with your health care   provider.  Pegfilgrastim injection What is this medicine? PEGFILGRASTIM (PEG fil gra stim) is a long-acting granulocyte colony-stimulating factor that stimulates the growth of neutrophils, a type of white blood cell important in the body's fight against infection. It is used to reduce the incidence of fever and infection  in patients with certain types of cancer who are receiving chemotherapy that affects the bone marrow, and to increase survival after being exposed to high doses of radiation. This medicine may be used for other purposes; ask your health care provider or pharmacist if you have questions. What should I tell my health care provider before I take this medicine? They need to know if you have any of these conditions: -kidney disease -latex allergy -ongoing radiation therapy -sickle cell disease -skin reactions to acrylic adhesives (On-Body Injector only) -an unusual or allergic reaction to pegfilgrastim, filgrastim, other medicines, foods, dyes, or preservatives -pregnant or trying to get pregnant -breast-feeding How should I use this medicine? This medicine is for injection under the skin. If you get this medicine at home, you will be taught how to prepare and give the pre-filled syringe or how to use the On-body Injector. Refer to the patient Instructions for Use for detailed instructions. Use exactly as directed. Take your medicine at regular intervals. Do not take your medicine more often than directed. It is important that you put your used needles and syringes in a special sharps container. Do not put them in a trash can. If you do not have a sharps container, call your pharmacist or healthcare provider to get one. Talk to your pediatrician regarding the use of this medicine in children. While this drug may be prescribed for selected conditions, precautions do apply. Overdosage: If you think you have taken too much of this medicine contact a poison control center or emergency room at once. NOTE: This medicine is only for you. Do not share this medicine with others. What if I miss a dose? It is important not to miss your dose. Call your doctor or health care professional if you miss your dose. If you miss a dose due to an On-body Injector failure or leakage, a new dose should be administered as  soon as possible using a single prefilled syringe for manual use. What may interact with this medicine? Interactions have not been studied. Give your health care provider a list of all the medicines, herbs, non-prescription drugs, or dietary supplements you use. Also tell them if you smoke, drink alcohol, or use illegal drugs. Some items may interact with your medicine. This list may not describe all possible interactions. Give your health care provider a list of all the medicines, herbs, non-prescription drugs, or dietary supplements you use. Also tell them if you smoke, drink alcohol, or use illegal drugs. Some items may interact with your medicine. What should I watch for while using this medicine? You may need blood work done while you are taking this medicine. If you are going to need a MRI, CT scan, or other procedure, tell your doctor that you are using this medicine (On-Body Injector only). What side effects may I notice from receiving this medicine? Side effects that you should report to your doctor or health care professional as soon as possible: -allergic reactions like skin rash, itching or hives, swelling of the face, lips, or tongue -dizziness -fever -pain, redness, or irritation at site where injected -pinpoint red spots on the skin -red or dark-brown urine -shortness of breath or breathing problems -stomach or side  pain, or pain at the shoulder -swelling -tiredness -trouble passing urine or change in the amount of urine Side effects that usually do not require medical attention (report to your doctor or health care professional if they continue or are bothersome): -bone pain -muscle pain This list may not describe all possible side effects. Call your doctor for medical advice about side effects. You may report side effects to FDA at 1-800-FDA-1088. Where should I keep my medicine? Keep out of the reach of children. Store pre-filled syringes in a refrigerator between 2 and 8  degrees C (36 and 46 degrees F). Do not freeze. Keep in carton to protect from light. Throw away this medicine if it is left out of the refrigerator for more than 48 hours. Throw away any unused medicine after the expiration date. NOTE: This sheet is a summary. It may not cover all possible information. If you have questions about this medicine, talk to your doctor, pharmacist, or health care provider.    2016, Elsevier/Gold Standard. (2014-01-27 14:30:14)

## 2014-11-24 ENCOUNTER — Encounter: Payer: Self-pay | Admitting: *Deleted

## 2014-11-25 ENCOUNTER — Other Ambulatory Visit: Payer: Self-pay | Admitting: Oncology

## 2014-11-25 ENCOUNTER — Other Ambulatory Visit: Payer: Self-pay | Admitting: General Surgery

## 2014-11-25 DIAGNOSIS — C50411 Malignant neoplasm of upper-outer quadrant of right female breast: Secondary | ICD-10-CM

## 2014-11-28 ENCOUNTER — Telehealth: Payer: Self-pay | Admitting: Oncology

## 2014-11-28 ENCOUNTER — Ambulatory Visit (HOSPITAL_BASED_OUTPATIENT_CLINIC_OR_DEPARTMENT_OTHER): Payer: BLUE CROSS/BLUE SHIELD | Admitting: Oncology

## 2014-11-28 ENCOUNTER — Other Ambulatory Visit (HOSPITAL_BASED_OUTPATIENT_CLINIC_OR_DEPARTMENT_OTHER): Payer: BLUE CROSS/BLUE SHIELD

## 2014-11-28 VITALS — BP 134/43 | HR 74 | Temp 98.0°F | Resp 18 | Ht 69.0 in | Wt 212.1 lb

## 2014-11-28 DIAGNOSIS — C50411 Malignant neoplasm of upper-outer quadrant of right female breast: Secondary | ICD-10-CM | POA: Diagnosis not present

## 2014-11-28 DIAGNOSIS — C773 Secondary and unspecified malignant neoplasm of axilla and upper limb lymph nodes: Secondary | ICD-10-CM | POA: Diagnosis not present

## 2014-11-28 DIAGNOSIS — E119 Type 2 diabetes mellitus without complications: Secondary | ICD-10-CM

## 2014-11-28 DIAGNOSIS — Z171 Estrogen receptor negative status [ER-]: Secondary | ICD-10-CM

## 2014-11-28 DIAGNOSIS — Z794 Long term (current) use of insulin: Secondary | ICD-10-CM

## 2014-11-28 LAB — CBC WITH DIFFERENTIAL/PLATELET
BASO%: 0.8 % (ref 0.0–2.0)
Basophils Absolute: 0 10*3/uL (ref 0.0–0.1)
EOS%: 0.9 % (ref 0.0–7.0)
Eosinophils Absolute: 0 10*3/uL (ref 0.0–0.5)
HCT: 28.3 % — ABNORMAL LOW (ref 34.8–46.6)
HGB: 9.4 g/dL — ABNORMAL LOW (ref 11.6–15.9)
LYMPH%: 40.1 % (ref 14.0–49.7)
MCH: 31.4 pg (ref 25.1–34.0)
MCHC: 33.1 g/dL (ref 31.5–36.0)
MCV: 94.9 fL (ref 79.5–101.0)
MONO#: 0.3 10*3/uL (ref 0.1–0.9)
MONO%: 8.5 % (ref 0.0–14.0)
NEUT#: 1.7 10*3/uL (ref 1.5–6.5)
NEUT%: 49.7 % (ref 38.4–76.8)
Platelets: 144 10*3/uL — ABNORMAL LOW (ref 145–400)
RBC: 2.98 10*6/uL — ABNORMAL LOW (ref 3.70–5.45)
RDW: 15.8 % — ABNORMAL HIGH (ref 11.2–14.5)
WBC: 3.3 10*3/uL — ABNORMAL LOW (ref 3.9–10.3)
lymph#: 1.3 10*3/uL (ref 0.9–3.3)

## 2014-11-28 LAB — COMPREHENSIVE METABOLIC PANEL (CC13)
ALT: 34 U/L (ref 0–55)
AST: 24 U/L (ref 5–34)
Albumin: 3.3 g/dL — ABNORMAL LOW (ref 3.5–5.0)
Alkaline Phosphatase: 81 U/L (ref 40–150)
Anion Gap: 6 mEq/L (ref 3–11)
BUN: 20.4 mg/dL (ref 7.0–26.0)
CO2: 25 mEq/L (ref 22–29)
Calcium: 9 mg/dL (ref 8.4–10.4)
Chloride: 110 mEq/L — ABNORMAL HIGH (ref 98–109)
Creatinine: 1 mg/dL (ref 0.6–1.1)
EGFR: 59 mL/min/{1.73_m2} — ABNORMAL LOW (ref >90)
Glucose: 170 mg/dl — ABNORMAL HIGH (ref 70–140)
Potassium: 4.6 mEq/L (ref 3.5–5.1)
Sodium: 140 mEq/L (ref 136–145)
Total Bilirubin: 0.74 mg/dL (ref 0.20–1.20)
Total Protein: 5.6 g/dL — ABNORMAL LOW (ref 6.4–8.3)

## 2014-11-28 NOTE — Telephone Encounter (Signed)
Appointments made and avs printed for patient °

## 2014-11-28 NOTE — Progress Notes (Signed)
[place]  [phone] [phone]     ID: Darlene [name] DOB: 11-Mar-1950  MR#: [id number]  LFY#:[id number]  Patient Care Team: Thressa Sheller, MD as PCP - General (Internal Medicine) Bryson Ha Himmelrich, RD as Dietitian (Bariatrics) Stark Klein, MD as Consulting Physician (General Surgery) Chauncey Cruel, MD as Consulting Physician (Oncology) Arloa Koh, MD as Consulting Physician (Radiation Oncology) Mauro Kaufmann, RN as Registered Nurse Rockwell Germany, RN as Registered Nurse Jacelyn Pi, MD as Consulting Physician (Endocrinology) PCP: Thressa Sheller, MD GYN: Kem Boroughs FNP OTHER MD:  CHIEF COMPLAINT:  HER-2 positive , estrogen receptor negative breast cancer  CURRENT TREATMENT:  anti-HER-2 treatment; awaiting definitive surgery  BREAST CANCER HISTORY: From the original intake note:  "Darlene [name]" had screening mammography at her gynecologist suggestive of a possible abnormality in the right breast. However right diagnostic mammogram 04/12/2013 at [facility] was negative. More recently however, she noted a lump in her right breast and brought it to her gynecologist's attention. On 07/18/2014 the patient had bilateral diagnostic mammography with tomosynthesis at [facility]. The breast density was category B. In the right breast upper outer quadrant there was an area of focal asymmetry with indistinct margins.[name] Ultrasound was obtained and showed in addition to the mass in question, measuring 1.7 cm, an abnormal-appearing lymph node in the right axillary tail.  On 07/19/2014 the patient underwent biopsy of the right breast massin question as well as a right axillary lymph node. Both were positive for invasive ductal carcinoma, grade 2, estrogen and progesterone receptor negative, with an MIB-1 of 90%, and HER-2 pending. Incidentally the lymph node biopsy showed a lymphocytic inflammatory component but no lymph node architecture.  Her subsequent  history is as detailed below.  INTERVAL HISTORY:  Darlene [name] returns today for follow-up of her breast cancer, accompanied by her husband [name]. Today is day 8, cycle 6 of 6 planned cycles of of carboplatin, docetaxel, trastuzumab and pertuzumab given every 3 weeks, with neulasta given on day 2. Darlene [name] just had a restaging breast MRI, which shows a complete radiologic response. This is very favorable: Complete radiologic response his correlate 90% of the time with complete pathologic response.  REVIEW OF SYSTEMS: Darlene [name] still has some tearing, and she continues to use the Tobrex eyedrops. The worst problem she had from the chemotherapy was the diarrhea associated with the pertuzumab. This lasted just about the whole 3 weeks last time and probably will last another couple of weeks before it resolves. She is using Imodium, as well as Questran. She only had vomiting 1, when she did not take her anti-emetics as scheduled. She had peripheral neuropathy involving the toes prior to the start of treatment and she does not believe it has become any worse. There is some numbness but no tingling. There is no peripheral neuropathy involving the hands. Aside from these issues she does have some fatigue and she is only working part-time, but she was able to continue to work part-time. She complains of some hearing loss. She has arthritis pains here and there but they are not more intense or persistent than before. She is a bit depressed. She is concerned because her onpro was placed and then immediately discarded. She also received a notification through the EMR that she did not receive her Neulasta shot on day 2 but in fact she did receive it. She wanted me to make sure all that was cleared up  PAST MEDICAL HISTORY: Past Medical History  Diagnosis Date  . Obesity   .  Hypertension   . Hyperlipidemia   . Obstructive sleep apnea on CPAP   . Diabetes mellitus without complication ([place])     33+ years  . Eating disorder      binge eating  . Balance problem   . Arthritis     in fingers, shoulders  . Anemia     hx of  . Depression   . Ovarian cyst rupture     possible  . Clumsiness   . Breast cancer of upper-outer quadrant of right female breast ([place]) 07/22/2014  . Breast cancer ([place])   . Anxiety   . Neuromuscular disorder ([place])     PAST SURGICAL HISTORY: Past Surgical History  Procedure Laterality Date  . Carpal tunnel release Right   . Carpal tunnel release Left   . Breath tek h pylori N/A 09/15/2012    Procedure: BREATH TEK H PYLORI;  Surgeon: Pedro Earls, MD;  Location: [facility];  Service: General;  Laterality: N/A;  . Dupuytren contracture release    . Trigger finger release      x3  . Toe surgery  1970s    bone spur  . Rotator cuff repair Right 2005  . Gastric roux-en-y N/A 11/02/2012    Procedure: LAPAROSCOPIC ROUX-EN-Y GASTRIC;  Surgeon: Pedro Earls, MD;  Location: [facility];  Service: General;  Laterality: N/A;  . Bunionectomy Left 12/2013  . Portacath placement Left 08/02/2014    Procedure: INSERTION PORT-A-CATH;  Surgeon: Stark Klein, MD;  Location: [facility];  Service: General;  Laterality: Left;    FAMILY HISTORY Family History  Problem Relation Age of Onset  . CVA Mother   . Diabetes Father   . Heart failure Father   . Hypertension Sister   . Diabetes Brother   . Diabetes Paternal Uncle    the patient's father died from complications of diabetes at age 65. The patient's mother died at age 9 with a subarachnoid hemorrhage. The patient had one brother, one sister. The only breast cancer was the patient's paternal grandmother diagnosed in her 58s. There is no history of ovarian cancer in the family.  GYNECOLOGIC HISTORY:  Patient's last menstrual period was 10/22/2007.  menarche age 6, the patient is GX P0. She went through the change of life in 2011. She did not take hormone replacement.  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 Her husband [name] is a retired Visual merchandiser (he taught at [employer]).  [name] has 2 children from an earlier marriage, Rockey Situ who teaches in [place], and Noora Locascio,  who works at the [employer] in in [place]. He has 1 grand son, aged 65. The patient and her husband are not church attender's    ADVANCED DIRECTIVES:  Not in place   HEALTH MAINTENANCE: Social History  Substance Use Topics  . Smoking status: Never Smoker   . Smokeless tobacco: Never Used  . Alcohol Use: 0.0 oz/week    0 Standard drinks or equivalent per week     Colonoscopy:  May 2016  PAP:  Bone density: 07/05/2013 normal, with a T score of 0.4  Lipid panel:  Allergies  Allergen Reactions  . Other Itching    Peanuts in large quantities     Current Outpatient Prescriptions  Medication Sig Dispense Refill  . acetaminophen (TYLENOL) 500 MG tablet Take 500 mg by mouth every 6 (six) hours as needed for pain.    [name]  aspirin 81 MG tablet Take 81 mg by mouth daily.      . Calcium Citrate-Vitamin D (CALCIUM CITRATE+D3 PO) Take 1,500 mg by mouth daily.    . cholestyramine (QUESTRAN) 4 G packet Take 1 packet (4 g total) by mouth 2 (two) times daily. 60 each 1  . diphenoxylate-atropine (LOMOTIL) 2.5-0.025 MG per tablet Take 1 tablet by mouth 4 (four) times daily as needed for diarrhea or loose stools. 60 tablet 1  . glucose blood (ONETOUCH VERIO) test strip 1 each by Other route as needed for other. Use as instructed    . lidocaine-prilocaine (EMLA) cream Apply to affected area once 30 g 3  . LORazepam (ATIVAN) 0.5 MG tablet Take 1 tablet (0.5 mg total) by mouth at bedtime. 30 tablet 0  . losartan (COZAAR) 100 MG tablet Take 1 tablet by mouth daily.     . mometasone (NASONEX) 50 MCG/ACT nasal spray Place 2 sprays into the nose daily as needed.      . Multiple Vitamin (MULTIVITAMIN WITH MINERALS) TABS tablet Take 1 tablet by mouth 2 (two) times daily.  With iron    . NOVOLIN 70/30 (70-30) 100 UNIT/ML injection 10 units two times a day    . Probiotic Product ([employer]S PROBIOTIC PO) Take 1 capsule by mouth daily.    [name] tobramycin-dexamethasone (TOBRADEX) ophthalmic solution Place 1 drop into both eyes 2 (two) times daily. 5 mL 1  . Vortioxetine HBr (BRINTELLIX) 20 MG TABS Take 20 mg by mouth daily. 90 tablet 0   No current facility-administered medications for this visit.    OBJECTIVE:  Middle-aged white woman in no acute distress Filed Vitals:   11/28/14 0918  BP: 134/43  Pulse: 74  Temp: 98 F (36.7 C)  Resp: 18     Body mass index is 31.31 kg/(m^2).    ECOG FS:1 - Symptomatic but completely ambulatory  Sclerae unicteric, pupils round and equal Oropharynx clear and moist-- no thrush or other lesions No cervical or supraclavicular adenopathy Lungs no rales or rhonchi Heart regular rate and rhythm Abd soft, obese, nontender, positive bowel sounds MSK no focal spinal tenderness, no upper extremity lymphedema Neuro: nonfocal, well oriented, appropriate affect Breasts: Deferred    LAB RESULTS:  CMP     Component Value Date/Time   NA 140 11/28/2014 0905   NA 140 02/18/2013 0827   K 4.6 11/28/2014 0905   K 4.3 02/18/2013 0827   CL 104 02/18/2013 0827   CO2 25 11/28/2014 0905   CO2 25 02/18/2013 0827   GLUCOSE 170* 11/28/2014 0905   GLUCOSE 168* 02/18/2013 0827   BUN 20.4 11/28/2014 0905   BUN 26* 02/18/2013 0827   CREATININE 1.0 11/28/2014 0905   CREATININE 1.10 02/18/2013 0827   CREATININE 1.37* 11/02/2012 1230   CALCIUM 9.0 11/28/2014 0905   CALCIUM 9.5 02/18/2013 0827   PROT 5.6* 11/28/2014 0905   PROT 6.3 02/18/2013 0827   ALBUMIN 3.3* 11/28/2014 0905   ALBUMIN 3.9 02/18/2013 0827   AST 24 11/28/2014 0905   AST 23 02/18/2013 0827   ALT 34 11/28/2014 0905   ALT 36* 02/18/2013 0827   ALKPHOS 81 11/28/2014 0905   ALKPHOS 68 02/18/2013 0827   BILITOT 0.74 11/28/2014 0905   BILITOT 0.9 02/18/2013 0827   GFRNONAA  55* 02/18/2013 0827   GFRNONAA 41* 11/02/2012 1230   GFRAA 63 02/18/2013 0827   GFRAA 48* 11/02/2012 1230    INo results found for: SPEP, UPEP  Lab Results  Component Value  Date   WBC 3.3* 11/28/2014   NEUTROABS 1.7 11/28/2014   HGB 9.4* 11/28/2014   HCT 28.3* 11/28/2014   MCV 94.9 11/28/2014   PLT 144* 11/28/2014      Chemistry      Component Value Date/Time   NA 140 11/28/2014 0905   NA 140 02/18/2013 0827   K 4.6 11/28/2014 0905   K 4.3 02/18/2013 0827   CL 104 02/18/2013 0827   CO2 25 11/28/2014 0905   CO2 25 02/18/2013 0827   BUN 20.4 11/28/2014 0905   BUN 26* 02/18/2013 0827   CREATININE 1.0 11/28/2014 0905   CREATININE 1.10 02/18/2013 0827   CREATININE 1.37* 11/02/2012 1230      Component Value Date/Time   CALCIUM 9.0 11/28/2014 0905   CALCIUM 9.5 02/18/2013 0827   ALKPHOS 81 11/28/2014 0905   ALKPHOS 68 02/18/2013 0827   AST 24 11/28/2014 0905   AST 23 02/18/2013 0827   ALT 34 11/28/2014 0905   ALT 36* 02/18/2013 0827   BILITOT 0.74 11/28/2014 0905   BILITOT 0.9 02/18/2013 0827       No results found for: LABCA2  No components found for: XIPJA250  No results for input(s): INR in the last 168 hours.  Urinalysis    Component Value Date/Time   BILIRUBINUR neg 06/29/2013 1017   PROTEINUR trace 06/29/2013 1017   UROBILINOGEN negative 06/29/2013 1017   NITRITE neg 06/29/2013 1017   LEUKOCYTESUR Negative 06/29/2013 1017    STUDIES: Mr Breast Bilateral W Wo Contrast  11/22/2014  CLINICAL DATA:  Right breast invasive ductal carcinoma and positive right axillary lymph node biopsy in in June of 2016. Patient has completed 15 weeks of chemotherapy. The patient can still feel a right breast lump. Family history of breast cancer in grandmother at age 4. LABS:  None applicable EXAM: BILATERAL BREAST MRI WITH AND WITHOUT CONTRAST TECHNIQUE: Multiplanar, multisequence MR images of both breasts were obtained prior to and following the intravenous  administration of 20 ml of MultiHance. THREE-DIMENSIONAL MR IMAGE RENDERING ON INDEPENDENT WORKSTATION: Three-dimensional MR images were rendered by post-processing of the original MR data on an independent workstation. The three-dimensional MR images were interpreted, and findings are reported in the following complete MRI report for this study. Three dimensional images were evaluated at the independent DynaCad workstation COMPARISON:  10/06/2014, 08/01/2014, and prior mammogram studies from [facility] dated 07/18/2014 and earlier FINDINGS: Breast composition: c. Heterogeneous fibroglandular tissue. Background parenchymal enhancement: Minimal Right breast: There has been interval resolution of non mass enhancement in the lateral aspect of the right breast. On the initial MRI, non mass enhancement spans 9.9 x 7.8 x 9.0 cm. Left breast: No mass or abnormal enhancement. Lymph nodes: No suspicious or abnormal appearing lymph nodes identified on the current study Ancillary findings:  Left-sided Port-A-Cath. IMPRESSION: 1. Complete MRI response to neoadjuvant treatment. No residual enhancement in the right breast. 2. No suspicious lymph nodes in the right axilla following neoadjuvant treatment. RECOMMENDATION: Treatment plan BI-RADS CATEGORY  6: Known biopsy-proven malignancy. Electronically Signed   By: Nolon Nations M.D.   On: 11/22/2014 13:25    ASSESSMENT: 63 y.o. [place] woman status post right breast and right axillary lymph node biopsy 07/19/2014 for a cT2  pN1, stage IIB  invasive ductal carcinoma, grade 2, estrogen and progesterone receptor negative, with an MIB-1 of 90%, and HER-2 amplified  (1) neoadjuvant chemotherapy started 08/08/2014, consisting of carboplatin, docetaxel, trastuzumab and pertuzumab given every 3 weeks 6, completed 11/21/2014  (  a) breast MRI after initial 3 cycles shows a significant response  (b) breast MRI after 6 cycles shows a complete radiologic response   (2)  trastuzumab to be continued to complete one year (through mid-July 2017)  (a) echo 11/08/2014 shows an EF of 66%  (3) Definitive surgery to follow chemotherapy, with consideration of the Alliance trial  (4) adjuvant radiation to follow surgery   PLAN: Darlene [name] has had an excellent response to her neoadjuvant treatment. She is now ready for surgery, which she thinks will be scheduled after Thanksgiving's.  We are continuing the Tobrex eyedrops until her epiphora has completely resolved. Hopefully that will occur in the next 2-3 weeks. She is also using Lomotil, Imodium, and Questran as needed to control her diarrhea. Again, I expect that to resolve in the next 2 or 3 weeks as the pertuzumab antibodies get metabolized.  We are continuing the Herceptin treatments. She understands these do not cause diarrhea or other symptoms and are generally very well tolerated. She does need echocardiograms every 3 months, with the next one due late January.  I am going to see her again with her first January Herceptin dose. We should have the final pathology available at that time. She knows to call for any problems that may develop before that visit.   Chauncey Cruel, MD   11/28/2014 9:50 AM

## 2014-12-05 ENCOUNTER — Telehealth: Payer: Self-pay | Admitting: *Deleted

## 2014-12-05 NOTE — Telephone Encounter (Signed)
Pt called with some confusion concerning surgery time. Reached out to [place] with Dr. Marlowe Aschoff office to call the pt work number d/t the inability for her to answer her cell. Discussed with pt that nuclear med appt for was [facility] bx and that she does not need to go to [facility]. Denies further needs at this time.

## 2014-12-09 ENCOUNTER — Other Ambulatory Visit: Payer: Self-pay

## 2014-12-09 DIAGNOSIS — C50411 Malignant neoplasm of upper-outer quadrant of right female breast: Secondary | ICD-10-CM

## 2014-12-12 ENCOUNTER — Ambulatory Visit (HOSPITAL_BASED_OUTPATIENT_CLINIC_OR_DEPARTMENT_OTHER): Payer: BLUE CROSS/BLUE SHIELD

## 2014-12-12 ENCOUNTER — Other Ambulatory Visit: Payer: Self-pay | Admitting: *Deleted

## 2014-12-12 ENCOUNTER — Other Ambulatory Visit (HOSPITAL_BASED_OUTPATIENT_CLINIC_OR_DEPARTMENT_OTHER): Payer: BLUE CROSS/BLUE SHIELD

## 2014-12-12 ENCOUNTER — Ambulatory Visit: Payer: BLUE CROSS/BLUE SHIELD

## 2014-12-12 VITALS — BP 137/51 | HR 54 | Temp 97.3°F | Resp 16

## 2014-12-12 DIAGNOSIS — C50411 Malignant neoplasm of upper-outer quadrant of right female breast: Secondary | ICD-10-CM

## 2014-12-12 DIAGNOSIS — Z5112 Encounter for antineoplastic immunotherapy: Secondary | ICD-10-CM | POA: Diagnosis not present

## 2014-12-12 DIAGNOSIS — Z95828 Presence of other vascular implants and grafts: Secondary | ICD-10-CM

## 2014-12-12 LAB — COMPREHENSIVE METABOLIC PANEL (CC13)
ALT: 32 U/L (ref 0–55)
AST: 25 U/L (ref 5–34)
Albumin: 3.2 g/dL — ABNORMAL LOW (ref 3.5–5.0)
Alkaline Phosphatase: 72 U/L (ref 40–150)
Anion Gap: 7 mEq/L (ref 3–11)
BUN: 22.1 mg/dL (ref 7.0–26.0)
CO2: 24 mEq/L (ref 22–29)
Calcium: 9 mg/dL (ref 8.4–10.4)
Chloride: 111 mEq/L — ABNORMAL HIGH (ref 98–109)
Creatinine: 0.9 mg/dL (ref 0.6–1.1)
EGFR: 72 mL/min/{1.73_m2} — ABNORMAL LOW (ref >90)
Glucose: 123 mg/dl (ref 70–140)
Potassium: 4.4 mEq/L (ref 3.5–5.1)
Sodium: 142 mEq/L (ref 136–145)
Total Bilirubin: 0.59 mg/dL (ref 0.20–1.20)
Total Protein: 5.5 g/dL — ABNORMAL LOW (ref 6.4–8.3)

## 2014-12-12 LAB — CBC WITH DIFFERENTIAL/PLATELET
BASO%: 0.5 % (ref 0.0–2.0)
Basophils Absolute: 0 10*3/uL (ref 0.0–0.1)
EOS%: 0.3 % (ref 0.0–7.0)
Eosinophils Absolute: 0 10*3/uL (ref 0.0–0.5)
HCT: 29.5 % — ABNORMAL LOW (ref 34.8–46.6)
HGB: 9.6 g/dL — ABNORMAL LOW (ref 11.6–15.9)
LYMPH%: 18.5 % (ref 14.0–49.7)
MCH: 31.3 pg (ref 25.1–34.0)
MCHC: 32.6 g/dL (ref 31.5–36.0)
MCV: 95.9 fL (ref 79.5–101.0)
MONO#: 0.4 10*3/uL (ref 0.1–0.9)
MONO%: 6.5 % (ref 0.0–14.0)
NEUT#: 5.1 10*3/uL (ref 1.5–6.5)
NEUT%: 74.2 % (ref 38.4–76.8)
Platelets: 202 10*3/uL (ref 145–400)
RBC: 3.07 10*6/uL — ABNORMAL LOW (ref 3.70–5.45)
RDW: 16.3 % — ABNORMAL HIGH (ref 11.2–14.5)
WBC: 6.9 10*3/uL (ref 3.9–10.3)
lymph#: 1.3 10*3/uL (ref 0.9–3.3)

## 2014-12-12 MED ORDER — DIPHENHYDRAMINE HCL 25 MG PO CAPS
ORAL_CAPSULE | ORAL | Status: AC
  Filled 2014-12-12: qty 1

## 2014-12-12 MED ORDER — HEPARIN SOD (PORK) LOCK FLUSH 100 UNIT/ML IV SOLN
500.0000 [IU] | Freq: Once | INTRAVENOUS | Status: DC
  Filled 2014-12-12: qty 5

## 2014-12-12 MED ORDER — TRASTUZUMAB CHEMO INJECTION 440 MG
6.0000 mg/kg | Freq: Once | INTRAVENOUS | Status: AC
  Administered 2014-12-12: 567 mg via INTRAVENOUS
  Filled 2014-12-12: qty 27

## 2014-12-12 MED ORDER — HEPARIN SOD (PORK) LOCK FLUSH 100 UNIT/ML IV SOLN
500.0000 [IU] | Freq: Once | INTRAVENOUS | Status: AC
  Administered 2014-12-12: 500 [IU] via INTRAVENOUS
  Filled 2014-12-12: qty 5

## 2014-12-12 MED ORDER — DIPHENHYDRAMINE HCL 25 MG PO CAPS
25.0000 mg | ORAL_CAPSULE | Freq: Once | ORAL | Status: AC
  Administered 2014-12-12: 25 mg via ORAL

## 2014-12-12 MED ORDER — SODIUM CHLORIDE 0.9 % IJ SOLN
10.0000 mL | INTRAMUSCULAR | Status: DC | PRN
  Administered 2014-12-12: 10 mL via INTRAVENOUS
  Filled 2014-12-12: qty 10

## 2014-12-12 MED ORDER — ACETAMINOPHEN 325 MG PO TABS
650.0000 mg | ORAL_TABLET | Freq: Once | ORAL | Status: AC
  Administered 2014-12-12: 650 mg via ORAL

## 2014-12-12 MED ORDER — ACETAMINOPHEN 325 MG PO TABS
ORAL_TABLET | ORAL | Status: AC
  Filled 2014-12-12: qty 2

## 2014-12-12 MED ORDER — SODIUM CHLORIDE 0.9 % IV SOLN
Freq: Once | INTRAVENOUS | Status: AC
  Administered 2014-12-12: 11:00:00 via INTRAVENOUS

## 2014-12-12 MED ORDER — ONDANSETRON HCL 8 MG PO TABS: 8.0000 mg | ORAL_TABLET | Freq: Three times a day (TID) | ORAL | Status: DC | PRN

## 2014-12-12 NOTE — Patient Instructions (Signed)
[facility] Discharge Instructions for Patients Receiving Chemotherapy  Today you received the following chemotherapy agents Herceptin  To help prevent nausea and vomiting after your treatment, we encourage you to take your nausea medication    If you develop nausea and vomiting that is not controlled by your nausea medication, call the clinic.   BELOW ARE SYMPTOMS THAT SHOULD BE REPORTED IMMEDIATELY:  *FEVER GREATER THAN 100.5 F  *CHILLS WITH OR WITHOUT FEVER  NAUSEA AND VOMITING THAT IS NOT CONTROLLED WITH YOUR NAUSEA MEDICATION  *UNUSUAL SHORTNESS OF BREATH  *UNUSUAL BRUISING OR BLEEDING  TENDERNESS IN MOUTH AND THROAT WITH OR WITHOUT PRESENCE OF ULCERS  *URINARY PROBLEMS  *BOWEL PROBLEMS  UNUSUAL RASH Items with * indicate a potential emergency and should be followed up as soon as possible.  Feel free to call the clinic you have any questions or concerns. The clinic phone number is [phone].  Please show the CHEMO ALERT CARD at check-in to the Emergency Department and triage nurse.   

## 2014-12-16 MED ORDER — PROCHLORPERAZINE MALEATE 10 MG PO TABS: 10.0000 mg | ORAL_TABLET | Freq: Four times a day (QID) | ORAL | Status: DC | PRN

## 2014-12-18 ENCOUNTER — Encounter (HOSPITAL_COMMUNITY): Payer: Self-pay | Admitting: Emergency Medicine

## 2014-12-18 ENCOUNTER — Inpatient Hospital Stay (HOSPITAL_COMMUNITY)
Admission: EM | Admit: 2014-12-18 | Discharge: 2014-12-21 | DRG: 195 | Disposition: A | Discharge status: Home / Self Care | Payer: BLUE CROSS/BLUE SHIELD | Attending: Internal Medicine | Admitting: Internal Medicine

## 2014-12-18 ENCOUNTER — Emergency Department (HOSPITAL_COMMUNITY): Payer: BLUE CROSS/BLUE SHIELD

## 2014-12-18 DIAGNOSIS — Z9221 Personal history of antineoplastic chemotherapy: Secondary | ICD-10-CM

## 2014-12-18 DIAGNOSIS — R05 Cough: Secondary | ICD-10-CM | POA: Diagnosis not present

## 2014-12-18 DIAGNOSIS — C50411 Malignant neoplasm of upper-outer quadrant of right female breast: Secondary | ICD-10-CM | POA: Diagnosis present

## 2014-12-18 DIAGNOSIS — E1142 Type 2 diabetes mellitus with diabetic polyneuropathy: Secondary | ICD-10-CM | POA: Diagnosis present

## 2014-12-18 DIAGNOSIS — I10 Essential (primary) hypertension: Secondary | ICD-10-CM | POA: Diagnosis present

## 2014-12-18 DIAGNOSIS — J189 Pneumonia, unspecified organism: Secondary | ICD-10-CM

## 2014-12-18 DIAGNOSIS — E119 Type 2 diabetes mellitus without complications: Secondary | ICD-10-CM

## 2014-12-18 DIAGNOSIS — F32A Depression, unspecified: Secondary | ICD-10-CM | POA: Diagnosis present

## 2014-12-18 DIAGNOSIS — Z8249 Family history of ischemic heart disease and other diseases of the circulatory system: Secondary | ICD-10-CM

## 2014-12-18 DIAGNOSIS — R059 Cough, unspecified: Secondary | ICD-10-CM

## 2014-12-18 DIAGNOSIS — F329 Major depressive disorder, single episode, unspecified: Secondary | ICD-10-CM | POA: Diagnosis not present

## 2014-12-18 DIAGNOSIS — D630 Anemia in neoplastic disease: Secondary | ICD-10-CM | POA: Diagnosis present

## 2014-12-18 DIAGNOSIS — E785 Hyperlipidemia, unspecified: Secondary | ICD-10-CM | POA: Diagnosis present

## 2014-12-18 DIAGNOSIS — Z794 Long term (current) use of insulin: Secondary | ICD-10-CM | POA: Diagnosis not present

## 2014-12-18 DIAGNOSIS — Z7982 Long term (current) use of aspirin: Secondary | ICD-10-CM

## 2014-12-18 DIAGNOSIS — Y95 Nosocomial condition: Secondary | ICD-10-CM | POA: Diagnosis present

## 2014-12-18 DIAGNOSIS — Z823 Family history of stroke: Secondary | ICD-10-CM

## 2014-12-18 DIAGNOSIS — F419 Anxiety disorder, unspecified: Secondary | ICD-10-CM | POA: Diagnosis present

## 2014-12-18 DIAGNOSIS — Z171 Estrogen receptor negative status [ER-]: Secondary | ICD-10-CM

## 2014-12-18 DIAGNOSIS — M1990 Unspecified osteoarthritis, unspecified site: Secondary | ICD-10-CM | POA: Diagnosis present

## 2014-12-18 DIAGNOSIS — Z9989 Dependence on other enabling machines and devices: Secondary | ICD-10-CM | POA: Diagnosis present

## 2014-12-18 DIAGNOSIS — Z833 Family history of diabetes mellitus: Secondary | ICD-10-CM

## 2014-12-18 DIAGNOSIS — G4733 Obstructive sleep apnea (adult) (pediatric): Secondary | ICD-10-CM | POA: Diagnosis present

## 2014-12-18 HISTORY — DX: Pneumonia, unspecified organism: J18.9

## 2014-12-18 LAB — CBC WITH DIFFERENTIAL/PLATELET
Basophils Absolute: 0 10*3/uL (ref 0.0–0.1)
Basophils Relative: 0 %
Eosinophils Absolute: 0 10*3/uL (ref 0.0–0.7)
Eosinophils Relative: 0 %
HCT: 33.3 % — ABNORMAL LOW (ref 36.0–46.0)
Hemoglobin: 10.8 g/dL — ABNORMAL LOW (ref 12.0–15.0)
Lymphocytes Relative: 5 %
Lymphs Abs: 0.6 10*3/uL — ABNORMAL LOW (ref 0.7–4.0)
MCH: 31.6 pg (ref 26.0–34.0)
MCHC: 32.4 g/dL (ref 30.0–36.0)
MCV: 97.4 fL (ref 78.0–100.0)
Monocytes Absolute: 0.8 10*3/uL (ref 0.1–1.0)
Monocytes Relative: 6 %
Neutro Abs: 12.1 10*3/uL — ABNORMAL HIGH (ref 1.7–7.7)
Neutrophils Relative %: 89 %
Platelets: 179 10*3/uL (ref 150–400)
RBC: 3.42 MIL/uL — ABNORMAL LOW (ref 3.87–5.11)
RDW: 15.2 % (ref 11.5–15.5)
WBC: 13.5 10*3/uL — ABNORMAL HIGH (ref 4.0–10.5)

## 2014-12-18 LAB — COMPREHENSIVE METABOLIC PANEL
ALT: 22 U/L (ref 14–54)
AST: 21 U/L (ref 15–41)
Albumin: 3.6 g/dL (ref 3.5–5.0)
Alkaline Phosphatase: 73 U/L (ref 38–126)
Anion gap: 7 (ref 5–15)
BUN: 18 mg/dL (ref 6–20)
CO2: 26 mmol/L (ref 22–32)
Calcium: 8.9 mg/dL (ref 8.9–10.3)
Chloride: 108 mmol/L (ref 101–111)
Creatinine, Ser: 1.02 mg/dL — ABNORMAL HIGH (ref 0.44–1.00)
GFR calc Af Amer: >60 mL/min (ref >60)
GFR calc non Af Amer: 58 mL/min — ABNORMAL LOW (ref >60)
Glucose, Bld: 182 mg/dL — ABNORMAL HIGH (ref 65–99)
Potassium: 4.1 mmol/L (ref 3.5–5.1)
Sodium: 141 mmol/L (ref 135–145)
Total Bilirubin: 1 mg/dL (ref 0.3–1.2)
Total Protein: 6 g/dL — ABNORMAL LOW (ref 6.5–8.1)

## 2014-12-18 LAB — URINALYSIS, ROUTINE W REFLEX MICROSCOPIC
Bilirubin Urine: NEGATIVE
Glucose, UA: NEGATIVE mg/dL
Hgb urine dipstick: NEGATIVE
Ketones, ur: NEGATIVE mg/dL
Leukocytes, UA: NEGATIVE
Nitrite: NEGATIVE
Protein, ur: 30 mg/dL — AB
Specific Gravity, Urine: 1.023 (ref 1.005–1.030)
pH: 5 (ref 5.0–8.0)

## 2014-12-18 LAB — URINE MICROSCOPIC-ADD ON
Bacteria, UA: NONE SEEN
RBC / HPF: NONE SEEN RBC/hpf (ref 0–5)
Squamous Epithelial / LPF: NONE SEEN

## 2014-12-18 LAB — GLUCOSE, CAPILLARY: Glucose-Capillary: 222 mg/dL — ABNORMAL HIGH (ref 65–99)

## 2014-12-18 LAB — I-STAT CG4 LACTIC ACID, ED: Lactic Acid, Venous: 1.36 mmol/L (ref 0.5–2.0)

## 2014-12-18 MED ORDER — SODIUM CHLORIDE 0.9 % IJ SOLN: 3.0000 mL | INTRAMUSCULAR | Status: DC | PRN

## 2014-12-18 MED ORDER — VANCOMYCIN HCL 10 G IV SOLR
1250.0000 mg | Freq: Two times a day (BID) | INTRAVENOUS | Status: DC
  Administered 2014-12-19 – 2014-12-21 (×5): 1250 mg via INTRAVENOUS
  Filled 2014-12-18 (×5): qty 1250

## 2014-12-18 MED ORDER — HEPARIN SODIUM (PORCINE) 5000 UNIT/ML IJ SOLN
5000.0000 [IU] | Freq: Three times a day (TID) | INTRAMUSCULAR | Status: DC
  Administered 2014-12-18 – 2014-12-21 (×8): 5000 [IU] via SUBCUTANEOUS
  Filled 2014-12-18 (×8): qty 1

## 2014-12-18 MED ORDER — PIPERACILLIN-TAZOBACTAM 3.375 G IVPB
3.3750 g | Freq: Once | INTRAVENOUS | Status: AC
  Administered 2014-12-18: 3.375 g via INTRAVENOUS
  Filled 2014-12-18: qty 50

## 2014-12-18 MED ORDER — ACETAMINOPHEN 325 MG PO TABS
650.0000 mg | ORAL_TABLET | Freq: Four times a day (QID) | ORAL | Status: DC | PRN
Start: 2014-12-18 — End: 2014-12-21
  Administered 2014-12-18 – 2014-12-19 (×2): 650 mg via ORAL
  Filled 2014-12-18 (×2): qty 2

## 2014-12-18 MED ORDER — DEXTROSE 5 % IV SOLN: 500.0000 mg | Freq: Once | INTRAVENOUS | Status: DC

## 2014-12-18 MED ORDER — INSULIN ASPART 100 UNIT/ML ~~LOC~~ SOLN
0.0000 [IU] | Freq: Three times a day (TID) | SUBCUTANEOUS | Status: DC
  Administered 2014-12-19: 2 [IU] via SUBCUTANEOUS
  Administered 2014-12-19: 3 [IU] via SUBCUTANEOUS
  Administered 2014-12-19: 5 [IU] via SUBCUTANEOUS
  Administered 2014-12-20: 3 [IU] via SUBCUTANEOUS
  Administered 2014-12-20: 8 [IU] via SUBCUTANEOUS
  Administered 2014-12-20: 5 [IU] via SUBCUTANEOUS
  Administered 2014-12-21: 3 [IU] via SUBCUTANEOUS

## 2014-12-18 MED ORDER — DEXTROSE 5 % IV SOLN: 1.0000 g | Freq: Once | INTRAVENOUS | Status: DC

## 2014-12-18 MED ORDER — CHOLESTYRAMINE 4 G PO PACK
4.0000 g | PACK | Freq: Two times a day (BID) | ORAL | Status: DC
  Administered 2014-12-18 – 2014-12-20 (×5): 4 g via ORAL
  Filled 2014-12-18 (×7): qty 1

## 2014-12-18 MED ORDER — LOSARTAN POTASSIUM 50 MG PO TABS
100.0000 mg | ORAL_TABLET | Freq: Every day | ORAL | Status: DC
  Administered 2014-12-18 – 2014-12-20 (×3): 100 mg via ORAL
  Filled 2014-12-18 (×4): qty 2

## 2014-12-18 MED ORDER — PROCHLORPERAZINE MALEATE 10 MG PO TABS
10.0000 mg | ORAL_TABLET | Freq: Four times a day (QID) | ORAL | Status: DC | PRN
Start: 2014-12-18 — End: 2014-12-21

## 2014-12-18 MED ORDER — VORTIOXETINE HBR 20 MG PO TABS
20.0000 mg | ORAL_TABLET | Freq: Every day | ORAL | Status: DC
  Administered 2014-12-18: 20 mg via ORAL
  Filled 2014-12-18 (×3): qty 20

## 2014-12-18 MED ORDER — PIPERACILLIN-TAZOBACTAM 3.375 G IVPB
3.3750 g | Freq: Three times a day (TID) | INTRAVENOUS | Status: DC
  Administered 2014-12-19 – 2014-12-21 (×7): 3.375 g via INTRAVENOUS
  Filled 2014-12-18 (×8): qty 50

## 2014-12-18 MED ORDER — INSULIN ASPART PROT & ASPART (70-30 MIX) 100 UNIT/ML ~~LOC~~ SUSP
10.0000 [IU] | Freq: Two times a day (BID) | SUBCUTANEOUS | Status: DC
  Administered 2014-12-19 – 2014-12-21 (×5): 10 [IU] via SUBCUTANEOUS
  Filled 2014-12-18 (×3): qty 10

## 2014-12-18 MED ORDER — ADULT MULTIVITAMIN W/MINERALS CH
1.0000 | ORAL_TABLET | Freq: Two times a day (BID) | ORAL | Status: DC
  Administered 2014-12-18 – 2014-12-21 (×5): 1 via ORAL
  Filled 2014-12-18 (×5): qty 1

## 2014-12-18 MED ORDER — INSULIN ASPART 100 UNIT/ML ~~LOC~~ SOLN
0.0000 [IU] | Freq: Every day | SUBCUTANEOUS | Status: DC
  Administered 2014-12-18: 2 [IU] via SUBCUTANEOUS

## 2014-12-18 MED ORDER — ACETAMINOPHEN 325 MG PO TABS: 650.0000 mg | ORAL_TABLET | Freq: Two times a day (BID) | ORAL | Status: DC | PRN

## 2014-12-18 MED ORDER — ACETAMINOPHEN 650 MG RE SUPP: 650.0000 mg | Freq: Four times a day (QID) | RECTAL | Status: DC | PRN

## 2014-12-18 MED ORDER — SODIUM CHLORIDE 0.9 % IJ SOLN
3.0000 mL | Freq: Two times a day (BID) | INTRAMUSCULAR | Status: DC
  Administered 2014-12-21: 3 mL via INTRAVENOUS

## 2014-12-18 MED ORDER — SODIUM CHLORIDE 0.9 % IV SOLN: 250.0000 mL | INTRAVENOUS | Status: DC | PRN

## 2014-12-18 MED ORDER — TOBRAMYCIN-DEXAMETHASONE 0.3-0.1 % OP SUSP
1.0000 [drp] | Freq: Two times a day (BID) | OPHTHALMIC | Status: DC
  Administered 2014-12-18 – 2014-12-20 (×3): 1 [drp] via OPHTHALMIC
  Filled 2014-12-18: qty 2.5

## 2014-12-18 MED ORDER — ONDANSETRON HCL 8 MG PO TABS
8.0000 mg | ORAL_TABLET | Freq: Three times a day (TID) | ORAL | Status: DC | PRN
  Administered 2014-12-19 – 2014-12-21 (×3): 8 mg via ORAL
  Filled 2014-12-18 (×3): qty 1

## 2014-12-18 MED ORDER — MELATONIN 5 MG PO TABS: 5.0000 mg | ORAL_TABLET | Freq: Every evening | ORAL | Status: DC | PRN

## 2014-12-18 MED ORDER — VANCOMYCIN HCL IN DEXTROSE 1-5 GM/200ML-% IV SOLN
1000.0000 mg | Freq: Once | INTRAVENOUS | Status: AC
  Administered 2014-12-18: 1000 mg via INTRAVENOUS
  Filled 2014-12-18: qty 200

## 2014-12-18 MED ORDER — SODIUM CHLORIDE 0.9 % IV SOLN
INTRAVENOUS | Status: DC
  Administered 2014-12-18: 17:00:00 via INTRAVENOUS

## 2014-12-18 MED ORDER — DIPHENHYDRAMINE HCL 25 MG PO TABS
25.0000 mg | ORAL_TABLET | Freq: Every day | ORAL | Status: DC | PRN
  Filled 2014-12-18: qty 1

## 2014-12-18 NOTE — ED Provider Notes (Signed)
CSN: [id number]     Arrival date & time 12/18/14  1408 History   First MD Initiated Contact with Patient 12/18/14 1507     Chief Complaint  Patient presents with  . Chemotherapy  . Fever     (Consider location/radiation/quality/duration/timing/severity/associated sxs/prior Treatment) HPI Comments: Pt with non-productive cough and no urinary sx Positive sick exposures Being tx for breast ca and last chemo was 4 weeks ago  Patient is a 64 y.o. female presenting with fever. The history is provided by the patient and the spouse.  Fever Temp source:  Oral Severity:  Moderate Onset quality:  Sudden Duration:  12 hours Timing:  Constant Progression:  Worsening Chronicity:  New Relieved by:  Acetaminophen Worsened by:  Nothing tried Ineffective treatments:  Acetaminophen Associated symptoms: chills, cough, myalgias, nausea, sore throat and vomiting     Past Medical History  Diagnosis Date  . Obesity   . Hypertension   . Hyperlipidemia   . Obstructive sleep apnea on CPAP   . Diabetes mellitus without complication ([place])     123456 years  . Eating disorder     binge eating  . Balance problem   . Arthritis     in fingers, shoulders  . Anemia     hx of  . Depression   . Ovarian cyst rupture     possible  . Clumsiness   . Breast cancer of upper-outer quadrant of right female breast ([place]) 07/22/2014  . Breast cancer ([place])   . Anxiety   . Neuromuscular disorder [facility])    Past Surgical History  Procedure Laterality Date  . Carpal tunnel release Right   . Carpal tunnel release Left   . Breath tek h pylori N/A 09/15/2012    Procedure: BREATH TEK H PYLORI;  Surgeon: Pedro Earls, MD;  Location: [facility];  Service: General;  Laterality: N/A;  . Dupuytren contracture release    . Trigger finger release      x3  . Toe surgery  1970s    bone spur  . Rotator cuff repair Right 2005  . Gastric roux-en-y N/A 11/02/2012    Procedure: LAPAROSCOPIC ROUX-EN-Y GASTRIC;  Surgeon:  Pedro Earls, MD;  Location: [facility];  Service: General;  Laterality: N/A;  . Bunionectomy Left 12/2013  . Portacath placement Left 08/02/2014    Procedure: INSERTION PORT-A-CATH;  Surgeon: Stark Klein, MD;  Location: [place];  Service: General;  Laterality: Left;   Family History  Problem Relation Age of Onset  . CVA Mother   . Diabetes Father   . Heart failure Father   . Hypertension Sister   . Diabetes Brother   . Diabetes Paternal Uncle    Social History  Substance Use Topics  . Smoking status: Never Smoker   . Smokeless tobacco: Never Used  . Alcohol Use: 0.0 oz/week    0 Standard drinks or equivalent per week   OB History    Gravida Para Term Preterm AB TAB SAB Ectopic Multiple Living   1    1          Review of Systems  Constitutional: Positive for fever and chills.  HENT: Positive for sore throat.   Respiratory: Positive for cough.   Gastrointestinal: Positive for nausea and vomiting.  Musculoskeletal: Positive for myalgias.  All other systems reviewed and are negative.     Allergies  Other and Tape  Home Medications   Prior to Admission medications   Medication Sig Start Date  End Date Taking? Authorizing Provider  acetaminophen (TYLENOL) 325 MG tablet Take 650 mg by mouth 2 (two) times daily as needed for moderate pain or headache.   Yes Historical Provider, MD  Calcium Citrate-Vitamin D (CALCIUM CITRATE+D3 PO) Take 1,800 mg by mouth daily.    Yes Historical Provider, MD  cholestyramine Lucrezia Starch) 4 G packet Take 1 packet (4 g total) by mouth 2 (two) times daily. 09/27/14  Yes Laurie Panda, NP  clotrimazole (LOTRIMIN) 1 % cream Apply 1 application topically 2 (two) times daily as needed (rash).   Yes Historical Provider, MD  diphenhydrAMINE (BENADRYL) 25 MG tablet Take 25 mg by mouth daily as needed for allergies or sleep.   Yes Historical Provider, MD  diphenoxylate-atropine (LOMOTIL) 2.5-0.025 MG per tablet Take 1 tablet by mouth 4  (four) times daily as needed for diarrhea or loose stools. 10/17/14  Yes Laurie Panda, NP  loperamide (IMODIUM) 2 MG capsule Take 4 mg by mouth as needed for diarrhea or loose stools.   Yes Historical Provider, MD  losartan (COZAAR) 100 MG tablet Take 1 tablet by mouth daily.  05/19/13  Yes Historical Provider, MD  Melatonin 5 MG TABS Take 5 mg by mouth at bedtime as needed (sleep).   Yes Historical Provider, MD  mometasone (NASONEX) 50 MCG/ACT nasal spray Place 2 sprays into the nose daily as needed (allergies).    Yes Historical Provider, MD  Multiple Vitamin (MULTIVITAMIN WITH MINERALS) TABS tablet Take 1 tablet by mouth 2 (two) times daily. With iron   Yes Historical Provider, MD  NOVOLIN 70/30 (70-30) 100 UNIT/ML injection 10 units two times a day 06/03/14  Yes Historical Provider, MD  ondansetron (ZOFRAN) 8 MG tablet Take 1 tablet (8 mg total) by mouth every 8 (eight) hours as needed for nausea or vomiting. 12/12/14  Yes Chauncey Cruel, MD  Probiotic Product ([employer] PROBIOTIC PO) Take 1 capsule by mouth daily. 08/11/14  Yes Historical Provider, MD  prochlorperazine (COMPAZINE) 10 MG tablet Take 1 tablet (10 mg total) by mouth every 6 (six) hours as needed for nausea or vomiting. 12/16/14  Yes Chauncey Cruel, MD  tobramycin-dexamethasone [facility]) ophthalmic solution Place 1 drop into both eyes 2 (two) times daily. 08/04/14  Yes Chauncey Cruel, MD  Trastuzumab (HERCEPTIN IV) Inject into the vein. Infusion at [facility]   Yes Historical Provider, MD  Vortioxetine HBr (BRINTELLIX) 20 MG TABS Take 20 mg by mouth daily. 09/16/14  Yes Laurie Panda, NP  aspirin 81 MG tablet Take 81 mg by mouth daily.      Historical Provider, MD  glucose blood (ONETOUCH VERIO) test strip 1 each by Other route as needed for other. Use as instructed    Historical Provider, MD   BP 170/67 mmHg  Pulse 109  Temp(Src) 100.9 F (38.3 C) (Oral)  Resp 18  SpO2 94%  LMP 10/22/2007 Physical Exam  Constitutional: She  is oriented to person, place, and time. She appears well-developed and well-nourished.  Non-toxic appearance. No distress.  HENT:  Head: Normocephalic and atraumatic.  Eyes: Conjunctivae, EOM and lids are normal. Pupils are equal, round, and reactive to light.  Neck: Normal range of motion. Neck supple. No tracheal deviation present. No thyroid mass present.  Cardiovascular: Regular rhythm and normal heart sounds.  Tachycardia present.  Exam reveals no gallop.   No murmur heard. Pulmonary/Chest: Effort normal and breath sounds normal. No stridor. No respiratory distress. She has no decreased breath sounds. She has no wheezes.  She has no rhonchi. She has no rales.  Abdominal: Soft. Normal appearance and bowel sounds are normal. She exhibits no distension. There is no tenderness. There is no rebound and no CVA tenderness.  Musculoskeletal: Normal range of motion. She exhibits no edema or tenderness.  Neurological: She is alert and oriented to person, place, and time. She has normal strength. No cranial nerve deficit or sensory deficit. GCS eye subscore is 4. GCS verbal subscore is 5. GCS motor subscore is 6.  Skin: Skin is warm and dry. No abrasion and no rash noted.  Psychiatric: She has a normal mood and affect. Her speech is normal and behavior is normal.  Nursing note and vitals reviewed.   ED Course  Procedures (including critical care time) Labs Review Labs Reviewed  CULTURE, BLOOD (ROUTINE X 2)  CULTURE, BLOOD (ROUTINE X 2)  URINE CULTURE  CBC WITH DIFFERENTIAL/PLATELET  COMPREHENSIVE METABOLIC PANEL  URINALYSIS, ROUTINE W REFLEX MICROSCOPIC (NOT AT [facility])  I-STAT CG4 LACTIC ACID, ED    Imaging Review No results found. I have personally reviewed and evaluated these images and lab results as part of my medical decision-making.   EKG Interpretation None      MDM   Final diagnoses:  Cough    Patient's x-ray would likely early pneumonia. Started on antibiotics here. No  evidence of sepsis at this time. Will be admitted to the hospitalist service    Lacretia Leigh, MD 12/18/14 1740

## 2014-12-18 NOTE — ED Notes (Addendum)
Pt states that she has been having sore throat, nasal congestion, fever x 1 day.  Last chemo was 4 wks ago.  Herceptin 1 wk ago.  Vomiting, diarrhea also.

## 2014-12-18 NOTE — H&P (Signed)
[facility] History and Physical  Darlene [name] [id number] DOB: March 09, 1950 DOA: 12/18/2014  Referring physician: Dr. Zenia Resides PCP: Thressa Sheller, MD   Chief Complaint: cold like symptoms  HPI: Darlene [name] is a 64 y.o. female  With history of breast cancer and chemotherapy roughly 4 weeks ago per patient report. She states that she developed some cold-like symptoms the last few days and decided to present to the hospital for further evaluation after she noted to have a fever. On evaluation in the ED patient was found to have x-ray suspicious for pneumonia. The patient denies any shortness of breath, dyspnea on exertion, hemoptysis. She has no other complaints currently otherwise.   Review of Systems:  Constitutional:  No weight loss, night sweats, Fevers, chills, fatigue.  HEENT:  No headaches, Difficulty swallowing,Tooth/dental problems,Sore throat,  No sneezing, itching, ear ache, nasal congestion, post nasal drip,  Cardio-vascular:  No chest pain, Orthopnea, PND, swelling in lower extremities, anasarca, dizziness, palpitations  GI:  No heartburn, indigestion, abdominal pain, nausea, vomiting, diarrhea, change in bowel habits, loss of appetite  Resp:  No shortness of breath with exertion or at rest. No excess mucus, no productive cough, + non-productive cough, No coughing up of blood.No change in color of mucus.No wheezing.No chest wall deformity  Skin:  no rash or lesions.  GU:  no dysuria, change in color of urine, no urgency or frequency. No flank pain.  Musculoskeletal:  No joint pain or swelling. No decreased range of motion. No back pain.  Psych:  No change in mood or affect. No depression or anxiety. No memory loss.   Past Medical History  Diagnosis Date  . Obesity   . Hypertension   . Hyperlipidemia   . Obstructive sleep apnea on CPAP   . Diabetes mellitus without complication ([place])     123456 years  . Eating disorder     binge eating  .  Balance problem   . Arthritis     in fingers, shoulders  . Anemia     hx of  . Depression   . Ovarian cyst rupture     possible  . Clumsiness   . Breast cancer of upper-outer quadrant of right female breast ([place]) 07/22/2014  . Breast cancer ([place])   . Anxiety   . Neuromuscular disorder [facility])    Past Surgical History  Procedure Laterality Date  . Carpal tunnel release Right   . Carpal tunnel release Left   . Breath tek h pylori N/A 09/15/2012    Procedure: BREATH TEK H PYLORI;  Surgeon: Pedro Earls, MD;  Location: [facility];  Service: General;  Laterality: N/A;  . Dupuytren contracture release    . Trigger finger release      x3  . Toe surgery  1970s    bone spur  . Rotator cuff repair Right 2005  . Gastric roux-en-y N/A 11/02/2012    Procedure: LAPAROSCOPIC ROUX-EN-Y GASTRIC;  Surgeon: Pedro Earls, MD;  [facility];  Service: General;  Laterality: N/A;  . Bunionectomy Left 12/2013  . Portacath placement Left 08/02/2014    Procedure: INSERTION PORT-A-CATH;  Surgeon: Stark Klein, MD;  Location: [place];  Service: General;  Laterality: Left;   Social History:  reports that she has never smoked. She has never used smokeless tobacco. She reports that she drinks alcohol. She reports that she does not use illicit drugs.  Allergies  Allergen Reactions  . Other Itching    Peanuts in large quantities   .  Tape Hives and Rash    Family History  Problem Relation Age of Onset  . CVA Mother   . Diabetes Father   . Heart failure Father   . Hypertension Sister   . Diabetes Brother   . Diabetes Paternal Uncle     Prior to Admission medications   Medication Sig Start Date End Date Taking? Authorizing Provider  acetaminophen (TYLENOL) 325 MG tablet Take 650 mg by mouth 2 (two) times daily as needed for moderate pain or headache.   Yes Historical Provider, MD  Calcium Citrate-Vitamin D (CALCIUM CITRATE+D3 PO) Take 1,800 mg by mouth daily.    Yes  Historical Provider, MD  cholestyramine Lucrezia Starch) 4 G packet Take 1 packet (4 g total) by mouth 2 (two) times daily. 09/27/14  Yes Laurie Panda, NP  clotrimazole (LOTRIMIN) 1 % cream Apply 1 application topically 2 (two) times daily as needed (rash).   Yes Historical Provider, MD  diphenhydrAMINE (BENADRYL) 25 MG tablet Take 25 mg by mouth daily as needed for allergies or sleep.   Yes Historical Provider, MD  diphenoxylate-atropine (LOMOTIL) 2.5-0.025 MG per tablet Take 1 tablet by mouth 4 (four) times daily as needed for diarrhea or loose stools. 10/17/14  Yes Laurie Panda, NP  loperamide (IMODIUM) 2 MG capsule Take 4 mg by mouth as needed for diarrhea or loose stools.   Yes Historical Provider, MD  losartan (COZAAR) 100 MG tablet Take 1 tablet by mouth daily.  05/19/13  Yes Historical Provider, MD  Melatonin 5 MG TABS Take 5 mg by mouth at bedtime as needed (sleep).   Yes Historical Provider, MD  mometasone (NASONEX) 50 MCG/ACT nasal spray Place 2 sprays into the nose daily as needed (allergies).    Yes Historical Provider, MD  Multiple Vitamin (MULTIVITAMIN WITH MINERALS) TABS tablet Take 1 tablet by mouth 2 (two) times daily. With iron   Yes Historical Provider, MD  NOVOLIN 70/30 (70-30) 100 UNIT/ML injection 10 units two times a day 06/03/14  Yes Historical Provider, MD  ondansetron (ZOFRAN) 8 MG tablet Take 1 tablet (8 mg total) by mouth every 8 (eight) hours as needed for nausea or vomiting. 12/12/14  Yes Chauncey Cruel, MD  Probiotic Product ([employer] PROBIOTIC PO) Take 1 capsule by mouth daily. 08/11/14  Yes Historical Provider, MD  prochlorperazine (COMPAZINE) 10 MG tablet Take 1 tablet (10 mg total) by mouth every 6 (six) hours as needed for nausea or vomiting. 12/16/14  Yes Chauncey Cruel, MD  tobramycin-dexamethasone [facility]) ophthalmic solution Place 1 drop into both eyes 2 (two) times daily. 08/04/14  Yes Chauncey Cruel, MD  Trastuzumab (HERCEPTIN IV) Inject into the vein.  Infusion at [facility]   Yes Historical Provider, MD  Vortioxetine HBr (BRINTELLIX) 20 MG TABS Take 20 mg by mouth daily. 09/16/14  Yes Laurie Panda, NP  aspirin 81 MG tablet Take 81 mg by mouth daily.      Historical Provider, MD  glucose blood (ONETOUCH VERIO) test strip 1 each by Other route as needed for other. Use as instructed    Historical Provider, MD   Physical Exam: Filed Vitals:   12/18/14 1414  BP: 170/67  Pulse: 109  Temp: 100.9 F (38.3 C)  TempSrc: Oral  Resp: 18  SpO2: 94%    Wt Readings from Last 3 Encounters:  11/28/14 96.208 kg (212 lb 1.6 oz)  11/22/14 97.977 kg (216 lb)  11/21/14 98.022 kg (216 lb 1.6 oz)    General:  Appears  calm and comfortable Eyes: PERRL, normal lids, irises & conjunctiva ENT: grossly normal hearing, lips & tongue Neck: no LAD, masses or thyromegaly Cardiovascular: RRR, no m/r/g. No LE edema. Respiratory: No wheezes, equal chest rise, rales over left lung base Abdomen: soft, nt, nd Skin: no rash or induration seen on limited exam Musculoskeletal: grossly normal tone BUE/BLE Psychiatric: grossly normal mood and affect, speech fluent and appropriate Neurologic: grossly non-focal.          Labs on Admission:  Basic Metabolic Panel:  Recent Labs Lab 12/12/14 0934 12/18/14 1540  NA 142 141  K 4.4 4.1  CL  --  108  CO2 24 26  GLUCOSE 123 182*  BUN 22.1 18  CREATININE 0.9 1.02*  CALCIUM 9.0 8.9   Liver Function Tests:  Recent Labs Lab 12/12/14 0934 12/18/14 1540  AST 25 21  ALT 32 22  ALKPHOS 72 73  BILITOT 0.59 1.0  PROT 5.5* 6.0*  ALBUMIN 3.2* 3.6   No results for input(s): LIPASE, AMYLASE in the last 168 hours. No results for input(s): AMMONIA in the last 168 hours. CBC:  Recent Labs Lab 12/12/14 0934 12/18/14 1540  WBC 6.9 13.5*  NEUTROABS 5.1 12.1*  HGB 9.6* 10.8*  HCT 29.5* 33.3*  MCV 95.9 97.4  PLT 202 179   Cardiac Enzymes: No results for input(s): CKTOTAL, CKMB, CKMBINDEX, TROPONINI in the last  168 hours.  BNP (last 3 results) No results for input(s): BNP in the last 8760 hours.  ProBNP (last 3 results) No results for input(s): PROBNP in the last 8760 hours.  CBG: No results for input(s): GLUCAP in the last 168 hours.  Radiological Exams on Admission: Dg Chest 2 View  12/18/2014  CLINICAL DATA:  Sore throat, nasal congestion EXAM: CHEST  2 VIEW COMPARISON:  08/02/2014 FINDINGS: Cardiomediastinal silhouette is stable. Degenerative changes thoracic spine. Left subclavian Port-A-Cath is unchanged in position. There is streaky left basilar retrocardiac atelectasis or infiltrate. No pulmonary edema. IMPRESSION: Streaky left basilar retrocardiac atelectasis or infiltrate. No pulmonary edema. Electronically Signed   By: Lahoma Crocker M.D.   On: 12/18/2014 15:43     Assessment/Plan   HCAP (healthcare-associated pneumonia) - Will place routine pneumonia order set, please review for details -Agree with broad-spectrum IV antibiotics at this juncture. Will narrow next a.m. with improvement in condition   Active Problems:   Diabetes mellitus, type II, insulin dependent (HCC) - Diabetic diet, NovoLog 70/30 10 units subcutaneous twice a day, SSI    Depression - Stable continue home medication regimen    Breast cancer of upper-outer quadrant of right female breast ([place]) -We'll have patient follow-up with oncologist as outpatient for routine evaluation   Code Status: Full DVT Prophylaxis: Heparin Family Communication: Spouse at bedside Disposition Plan: Pending improvement in condition  Time spent: > 45 minutes  Velvet Bathe [facility] Pager [phone]

## 2014-12-18 NOTE — ED Notes (Signed)
Attempted to call report - nurse is not ready and patient may be moved to a different room.  Will call back to inform when bed is ready.

## 2014-12-18 NOTE — ED Notes (Signed)
Attempted to call report - secretary states "she is not ready.. They are still in report". Will call back at 1910.

## 2014-12-18 NOTE — Progress Notes (Signed)
PHARMACIST - PHYSICIAN ORDER COMMUNICATION  CONCERNING: P&T Medication Policy on Herbal Medications  DESCRIPTION:  This patient's order for:  Melatonin has been noted.  This product(s) is classified as an "herbal" or natural product. Due to a lack of definitive safety studies or FDA approval, nonstandard manufacturing practices, plus the potential risk of unknown drug-drug interactions while on inpatient medications, the Pharmacy and Therapeutics Committee does not permit the use of "herbal" or natural products of this type within [facility].   ACTION TAKEN: The pharmacy department is unable to verify this order at this time and your patient has been informed of this safety policy. Please reevaluate patient's clinical condition at discharge and address if the herbal or natural product(s) should be resumed at that time.  Romeo Rabon, PharmD, pager [phone]. 12/18/2014,7:48 PM.

## 2014-12-18 NOTE — Progress Notes (Signed)
ANTIBIOTIC CONSULT NOTE - INITIAL  Pharmacy Consult for vancomycin Indication: pneumonia  Allergies  Allergen Reactions  . Other Itching    Peanuts in large quantities   . Tape Hives and Rash    Patient Measurements:    Vital Signs: Temp: 100.9 F (38.3 C) (11/27 1414) Temp Source: Oral (11/27 1414) BP: 123/47 mmHg (11/27 1913) Pulse Rate: 90 (11/27 1913) Intake/Output from previous day:   Intake/Output from this shift:    Labs:  Recent Labs  12/18/14 1540  WBC 13.5*  HGB 10.8*  PLT 179  CREATININE 1.02*   CrCl cannot be calculated (Unknown ideal weight.). No results for input(s): VANCOTROUGH, VANCOPEAK, VANCORANDOM, GENTTROUGH, GENTPEAK, GENTRANDOM, TOBRATROUGH, TOBRAPEAK, TOBRARND, AMIKACINPEAK, AMIKACINTROU, AMIKACIN in the last 72 hours.   Microbiology: No results found for this or any previous visit (from the past 720 hour(s)).  Medical History: Past Medical History  Diagnosis Date  . Obesity   . Hypertension   . Hyperlipidemia   . Obstructive sleep apnea on CPAP   . Diabetes mellitus without complication ([place])     123456 years  . Eating disorder     binge eating  . Balance problem   . Arthritis     in fingers, shoulders  . Anemia     hx of  . Depression   . Ovarian cyst rupture     possible  . Clumsiness   . Breast cancer of upper-outer quadrant of right female breast ([place]) 07/22/2014  . Breast cancer ([place])   . Anxiety   . Neuromuscular disorder (HCC)     Medications:  Scheduled:  . cholestyramine  4 g Oral BID  . heparin  5,000 Units Subcutaneous 3 times per day  . [START ON 12/19/2014] insulin aspart  0-15 Units Subcutaneous TID WC  . insulin aspart  0-5 Units Subcutaneous QHS  . [START ON 12/19/2014] insulin aspart protamine- aspart  10 Units Subcutaneous BID WC  . losartan  100 mg Oral Daily  . multivitamin with minerals  1 tablet Oral BID  . sodium chloride  3 mL Intravenous Q12H  . tobramycin-dexamethasone  1 drop Both Eyes BID  .  Vortioxetine HBr  20 mg Oral Daily   Infusions:     Assessment: 64 yo female presented to ER with cold like symptoms. PMH includes breast cancer currently receiving Herceptin and DM. To start vancomycin and Zosyn for PNA  Goal of Therapy:  Vancomycin trough level 15-20 mcg/ml  Plan:  1) Vancomycin 1g x 1 in ER then 1250mg  IV q12 2) Zosyn 3.375g IV q8 (extended interval infusion) for CrCl > 20 ml/min   Adrian Saran, PharmD, BCPS Pager [phone] 12/18/2014 7:43 PM

## 2014-12-19 DIAGNOSIS — Z823 Family history of stroke: Secondary | ICD-10-CM | POA: Diagnosis not present

## 2014-12-19 DIAGNOSIS — C50411 Malignant neoplasm of upper-outer quadrant of right female breast: Secondary | ICD-10-CM | POA: Diagnosis present

## 2014-12-19 DIAGNOSIS — Z833 Family history of diabetes mellitus: Secondary | ICD-10-CM | POA: Diagnosis not present

## 2014-12-19 DIAGNOSIS — E119 Type 2 diabetes mellitus without complications: Secondary | ICD-10-CM | POA: Diagnosis not present

## 2014-12-19 DIAGNOSIS — R5381 Other malaise: Secondary | ICD-10-CM | POA: Diagnosis not present

## 2014-12-19 DIAGNOSIS — Y95 Nosocomial condition: Secondary | ICD-10-CM | POA: Diagnosis present

## 2014-12-19 DIAGNOSIS — Z9221 Personal history of antineoplastic chemotherapy: Secondary | ICD-10-CM | POA: Diagnosis not present

## 2014-12-19 DIAGNOSIS — R509 Fever, unspecified: Secondary | ICD-10-CM | POA: Diagnosis not present

## 2014-12-19 DIAGNOSIS — C50911 Malignant neoplasm of unspecified site of right female breast: Secondary | ICD-10-CM | POA: Diagnosis not present

## 2014-12-19 DIAGNOSIS — Z794 Long term (current) use of insulin: Secondary | ICD-10-CM | POA: Diagnosis not present

## 2014-12-19 DIAGNOSIS — D630 Anemia in neoplastic disease: Secondary | ICD-10-CM | POA: Diagnosis present

## 2014-12-19 DIAGNOSIS — Z8249 Family history of ischemic heart disease and other diseases of the circulatory system: Secondary | ICD-10-CM | POA: Diagnosis not present

## 2014-12-19 DIAGNOSIS — E785 Hyperlipidemia, unspecified: Secondary | ICD-10-CM | POA: Diagnosis present

## 2014-12-19 DIAGNOSIS — E1142 Type 2 diabetes mellitus with diabetic polyneuropathy: Secondary | ICD-10-CM | POA: Diagnosis present

## 2014-12-19 DIAGNOSIS — M1990 Unspecified osteoarthritis, unspecified site: Secondary | ICD-10-CM | POA: Diagnosis present

## 2014-12-19 DIAGNOSIS — F329 Major depressive disorder, single episode, unspecified: Secondary | ICD-10-CM | POA: Diagnosis present

## 2014-12-19 DIAGNOSIS — J189 Pneumonia, unspecified organism: Secondary | ICD-10-CM | POA: Diagnosis present

## 2014-12-19 DIAGNOSIS — I10 Essential (primary) hypertension: Secondary | ICD-10-CM | POA: Diagnosis present

## 2014-12-19 DIAGNOSIS — R05 Cough: Secondary | ICD-10-CM | POA: Diagnosis present

## 2014-12-19 DIAGNOSIS — F419 Anxiety disorder, unspecified: Secondary | ICD-10-CM | POA: Diagnosis present

## 2014-12-19 DIAGNOSIS — Z7982 Long term (current) use of aspirin: Secondary | ICD-10-CM | POA: Diagnosis not present

## 2014-12-19 LAB — BASIC METABOLIC PANEL
Anion gap: 6 (ref 5–15)
BUN: 18 mg/dL (ref 6–20)
CO2: 26 mmol/L (ref 22–32)
Calcium: 8.5 mg/dL — ABNORMAL LOW (ref 8.9–10.3)
Chloride: 105 mmol/L (ref 101–111)
Creatinine, Ser: 1.09 mg/dL — ABNORMAL HIGH (ref 0.44–1.00)
GFR calc Af Amer: >60 mL/min (ref >60)
GFR calc non Af Amer: 53 mL/min — ABNORMAL LOW (ref >60)
Glucose, Bld: 201 mg/dL — ABNORMAL HIGH (ref 65–99)
Potassium: 4 mmol/L (ref 3.5–5.1)
Sodium: 137 mmol/L (ref 135–145)

## 2014-12-19 LAB — EXPECTORATED SPUTUM ASSESSMENT W GRAM STAIN, RFLX TO RESP C

## 2014-12-19 LAB — CBC
HCT: 27.9 % — ABNORMAL LOW (ref 36.0–46.0)
Hemoglobin: 9.1 g/dL — ABNORMAL LOW (ref 12.0–15.0)
MCH: 31.3 pg (ref 26.0–34.0)
MCHC: 32.6 g/dL (ref 30.0–36.0)
MCV: 95.9 fL (ref 78.0–100.0)
Platelets: 170 10*3/uL (ref 150–400)
RBC: 2.91 MIL/uL — ABNORMAL LOW (ref 3.87–5.11)
RDW: 15.1 % (ref 11.5–15.5)
WBC: 15.9 10*3/uL — ABNORMAL HIGH (ref 4.0–10.5)

## 2014-12-19 LAB — GLUCOSE, CAPILLARY
Glucose-Capillary: 148 mg/dL — ABNORMAL HIGH (ref 65–99)
Glucose-Capillary: 216 mg/dL — ABNORMAL HIGH (ref 65–99)
Glucose-Capillary: 152 mg/dL — ABNORMAL HIGH (ref 65–99)
Glucose-Capillary: 95 mg/dL (ref 65–99)

## 2014-12-19 LAB — URINE CULTURE: Culture: NO GROWTH

## 2014-12-19 LAB — HIV ANTIBODY (ROUTINE TESTING W REFLEX): HIV Screen 4th Generation wRfx: NONREACTIVE

## 2014-12-19 LAB — EXPECTORATED SPUTUM ASSESSMENT W REFEX TO RESP CULTURE

## 2014-12-19 MED ORDER — VORTIOXETINE HBR 5 MG PO TABS
20.0000 mg | ORAL_TABLET | Freq: Every day | ORAL | Status: DC
  Administered 2014-12-19 – 2014-12-20 (×2): 20 mg via ORAL
  Filled 2014-12-19 (×3): qty 4

## 2014-12-19 NOTE — Progress Notes (Signed)
[facility] PROGRESS NOTE  Darlene [name] [id number] DOB: 12-Jun-1950 DOA: 12/18/2014 PCP: Thressa Sheller, MD  Assessment/Plan: Active Problems:  HCAP (healthcare-associated pneumonia) -Continue with broad-spectrum IV antibiotics at this juncture. - reassess cbc next am.  Active Problems:  Diabetes mellitus, type II, insulin dependent (HCC) - Diabetic diet, NovoLog 70/30 10 units subcutaneous twice a day, SSI   Depression - Stable continue home medication regimen   Breast cancer of upper-outer quadrant of right female breast ([place]) -We'll have patient follow-up with oncologist as outpatient for routine evaluation  Code Status: full Family Communication: No family at bedside Disposition Plan: pending improvement in condition.   Consultants:  None  Procedures:  None  Antibiotics:  Zosyn  Vancomycin  HPI/Subjective: Pt has no new complaints. No acute issues overnight.   Objective: Filed Vitals:   12/19/14 0450 12/19/14 1233  BP: 129/50 139/40  Pulse: 86 82  Temp: 98.9 F (37.2 C) 98.8 F (37.1 C)  Resp: 20 18    Intake/Output Summary (Last 24 hours) at 12/19/14 1832 Last data filed at 12/19/14 1234  Gross per 24 hour  Intake    496 ml  Output    300 ml  Net    196 ml   Filed Weights   12/18/14 1941  Weight: 96.2 kg (212 lb 1.3 oz)    Exam:   General:  Pt in nad, alert and awake  Cardiovascular: rrr, no mrg  Respiratory: No wheezes, equal chest rise, rales over left lung base  Abdomen: soft, nt, nd  Musculoskeletal: no cyanosis or clubbing  Data Reviewed: Basic Metabolic Panel:  Recent Labs Lab 12/18/14 1540 12/19/14 0501  NA 141 137  K 4.1 4.0  CL 108 105  CO2 26 26  GLUCOSE 182* 201*  BUN 18 18  CREATININE 1.02* 1.09*  CALCIUM 8.9 8.5*   Liver Function Tests:  Recent Labs Lab 12/18/14 1540  AST 21  ALT 22  ALKPHOS 73  BILITOT 1.0  PROT 6.0*  ALBUMIN 3.6   No results for input(s): LIPASE, AMYLASE  in the last 168 hours. No results for input(s): AMMONIA in the last 168 hours. CBC:  Recent Labs Lab 12/18/14 1540 12/19/14 0501  WBC 13.5* 15.9*  NEUTROABS 12.1*  --   HGB 10.8* 9.1*  HCT 33.3* 27.9*  MCV 97.4 95.9  PLT 179 170   Cardiac Enzymes: No results for input(s): CKTOTAL, CKMB, CKMBINDEX, TROPONINI in the last 168 hours. BNP (last 3 results) No results for input(s): BNP in the last 8760 hours.  ProBNP (last 3 results) No results for input(s): PROBNP in the last 8760 hours.  CBG:  Recent Labs Lab 12/18/14 2148 12/19/14 0810 12/19/14 1238 12/19/14 1806  GLUCAP 222* 148* 216* 152*    Recent Results (from the past 240 hour(s))  Culture, blood (routine x 2)     Status: None (Preliminary result)   Collection Time: 12/18/14  3:36 PM  Result Value Ref Range Status   Specimen Description BLOOD LEFT ARM  Final   Special Requests BOTTLES DRAWN AEROBIC AND ANAEROBIC 5CC  Final   Culture   Final    NO GROWTH < 24 HOURS Performed at [facility]    Report Status PENDING  Incomplete  Culture, blood (routine x 2)     Status: None (Preliminary result)   Collection Time: 12/18/14  4:00 PM  Result Value Ref Range Status   Specimen Description PORTA CATH  Final   Special Requests BOTTLES DRAWN AEROBIC AND ANAEROBIC 5CC  Final   Culture   Final    NO GROWTH < 24 HOURS Performed at [facility]    Report Status PENDING  Incomplete  Urine culture     Status: None   Collection Time: 12/18/14  4:35 PM  Result Value Ref Range Status   Specimen Description URINE, CLEAN CATCH  Final   Special Requests NONE  Final   Culture   Final    NO GROWTH 1 DAY Performed at [facility]    Report Status 12/19/2014 FINAL  Final  Culture, sputum-assessment     Status: None   Collection Time: 12/19/14 11:13 AM  Result Value Ref Range Status   Specimen Description SPUTUM  Final   Special Requests Immunocompromised  Final   Sputum evaluation   Final    THIS  SPECIMEN IS ACCEPTABLE. RESPIRATORY CULTURE REPORT TO FOLLOW.   Report Status 12/19/2014 FINAL  Final     Studies: Dg Chest 2 View  12/18/2014  CLINICAL DATA:  Sore throat, nasal congestion EXAM: CHEST  2 VIEW COMPARISON:  08/02/2014 FINDINGS: Cardiomediastinal silhouette is stable. Degenerative changes thoracic spine. Left subclavian Port-A-Cath is unchanged in position. There is streaky left basilar retrocardiac atelectasis or infiltrate. No pulmonary edema. IMPRESSION: Streaky left basilar retrocardiac atelectasis or infiltrate. No pulmonary edema. Electronically Signed   By: Lahoma Crocker M.D.   On: 12/18/2014 15:43    Scheduled Meds: . cholestyramine  4 g Oral BID  . heparin  5,000 Units Subcutaneous 3 times per day  . insulin aspart  0-15 Units Subcutaneous TID WC  . insulin aspart  0-5 Units Subcutaneous QHS  . insulin aspart protamine- aspart  10 Units Subcutaneous BID WC  . losartan  100 mg Oral Daily  . multivitamin with minerals  1 tablet Oral BID  . piperacillin-tazobactam (ZOSYN)  IV  3.375 g Intravenous Q8H  . sodium chloride  3 mL Intravenous Q12H  . tobramycin-dexamethasone  1 drop Both Eyes BID  . vancomycin  1,250 mg Intravenous Q12H  . Vortioxetine HBr  20 mg Oral QHS   Continuous Infusions:    Time spent: > 35 minutes    Velvet Bathe  [facility] Pager [phone] If 7PM-7AM, please contact night-coverage at [email or web address], password [facility] 12/19/2014, 6:32 PM  LOS: 0 days

## 2014-12-19 NOTE — Progress Notes (Signed)
Patient arrived on the unit at approximately 1927. She is alert and verbally responsive. In no acute distress at this time.

## 2014-12-20 LAB — CBC
HCT: 26.9 % — ABNORMAL LOW (ref 36.0–46.0)
Hemoglobin: 8.8 g/dL — ABNORMAL LOW (ref 12.0–15.0)
MCH: 31 pg (ref 26.0–34.0)
MCHC: 32.7 g/dL (ref 30.0–36.0)
MCV: 94.7 fL (ref 78.0–100.0)
Platelets: 173 10*3/uL (ref 150–400)
RBC: 2.84 MIL/uL — ABNORMAL LOW (ref 3.87–5.11)
RDW: 14.6 % (ref 11.5–15.5)
WBC: 14.2 10*3/uL — ABNORMAL HIGH (ref 4.0–10.5)

## 2014-12-20 LAB — GLUCOSE, CAPILLARY
Glucose-Capillary: 215 mg/dL — ABNORMAL HIGH (ref 65–99)
Glucose-Capillary: 178 mg/dL — ABNORMAL HIGH (ref 65–99)
Glucose-Capillary: 268 mg/dL — ABNORMAL HIGH (ref 65–99)

## 2014-12-20 LAB — STREP PNEUMONIAE URINARY ANTIGEN: Strep Pneumo Urinary Antigen: NEGATIVE

## 2014-12-20 NOTE — Progress Notes (Signed)
[facility] PROGRESS NOTE  Darlene [name] [id number] DOB: 10/22/1950 DOA: 12/18/2014 PCP: Thressa Sheller, MD  Patient is a 64 year old with breast cancer on chemotherapy. Which has plans for further workup and evaluation by general surgeon Dr. Barry Dienes. Presented with pneumonia and is currently being treated with IV antibiotics.  Assessment/Plan: Active Problems:  HCAP (healthcare-associated pneumonia) -Continue with broad-spectrum IV antibiotics at this juncture. - We'll plan on narrowing antibiotic regimen once patient defervesces.  Active Problems:  Diabetes mellitus, type II, insulin dependent (HCC) - Diabetic diet, NovoLog 70/30 10 units subcutaneous twice a day, SSI   Depression - Stable continue home medication regimen   Breast cancer of upper-outer quadrant of right female breast ([place]) -We'll have patient follow-up with oncologist as outpatient for routine evaluation  Code Status: full Family Communication: No family at bedside Disposition Plan: pending improvement in condition.   Consultants:  None  Procedures:  None  Antibiotics:  Zosyn  Vancomycin  HPI/Subjective: Pt has no new complaints. No acute issues overnight.   Objective: Filed Vitals:   12/20/14 0258 12/20/14 0542  BP:  141/55  Pulse:  86  Temp: 98.6 F (37 C) 98 F (36.7 C)  Resp:  20    Intake/Output Summary (Last 24 hours) at 12/20/14 1240 Last data filed at 12/20/14 0800  Gross per 24 hour  Intake      0 ml  Output   1150 ml  Net  -1150 ml   Filed Weights   12/18/14 1941  Weight: 96.2 kg (212 lb 1.3 oz)    Exam:   General:  Pt in nad, alert and awake  Cardiovascular: rrr, no mrg  Respiratory: No wheezes, equal chest rise, rales over left lung base  Abdomen: soft, nt, nd  Musculoskeletal: no cyanosis or clubbing  Data Reviewed: Basic Metabolic Panel:  Recent Labs Lab 12/18/14 1540 12/19/14 0501  NA 141 137  K 4.1 4.0  CL 108 105  CO2 26 26   GLUCOSE 182* 201*  BUN 18 18  CREATININE 1.02* 1.09*  CALCIUM 8.9 8.5*   Liver Function Tests:  Recent Labs Lab 12/18/14 1540  AST 21  ALT 22  ALKPHOS 73  BILITOT 1.0  PROT 6.0*  ALBUMIN 3.6   No results for input(s): LIPASE, AMYLASE in the last 168 hours. No results for input(s): AMMONIA in the last 168 hours. CBC:  Recent Labs Lab 12/18/14 1540 12/19/14 0501 12/20/14 0605  WBC 13.5* 15.9* 14.2*  NEUTROABS 12.1*  --   --   HGB 10.8* 9.1* 8.8*  HCT 33.3* 27.9* 26.9*  MCV 97.4 95.9 94.7  PLT 179 170 173   Cardiac Enzymes: No results for input(s): CKTOTAL, CKMB, CKMBINDEX, TROPONINI in the last 168 hours. BNP (last 3 results) No results for input(s): BNP in the last 8760 hours.  ProBNP (last 3 results) No results for input(s): PROBNP in the last 8760 hours.  CBG:  Recent Labs Lab 12/19/14 1238 12/19/14 1806 12/19/14 2136 12/20/14 0903 12/20/14 1224  GLUCAP 216* 152* 95 215* 178*    Recent Results (from the past 240 hour(s))  Culture, blood (routine x 2)     Status: None (Preliminary result)   Collection Time: 12/18/14  3:36 PM  Result Value Ref Range Status   Specimen Description BLOOD LEFT ARM  Final   Special Requests BOTTLES DRAWN AEROBIC AND ANAEROBIC 5CC  Final   Culture   Final    NO GROWTH < 24 HOURS Performed at [facility]  Report Status PENDING  Incomplete  Culture, blood (routine x 2)     Status: None (Preliminary result)   Collection Time: 12/18/14  4:00 PM  Result Value Ref Range Status   Specimen Description PORTA CATH  Final   Special Requests BOTTLES DRAWN AEROBIC AND ANAEROBIC 5CC  Final   Culture   Final    NO GROWTH < 24 HOURS Performed at [facility]    Report Status PENDING  Incomplete  Urine culture     Status: None   Collection Time: 12/18/14  4:35 PM  Result Value Ref Range Status   Specimen Description URINE, CLEAN CATCH  Final   Special Requests NONE  Final   Culture   Final    NO GROWTH 1  DAY Performed at [facility]    Report Status 12/19/2014 FINAL  Final  Culture, sputum-assessment     Status: None   Collection Time: 12/19/14 11:13 AM  Result Value Ref Range Status   Specimen Description SPUTUM  Final   Special Requests Immunocompromised  Final   Sputum evaluation   Final    THIS SPECIMEN IS ACCEPTABLE. RESPIRATORY CULTURE REPORT TO FOLLOW.   Report Status 12/19/2014 FINAL  Final  Culture, respiratory (NON-Expectorated)     Status: None (Preliminary result)   Collection Time: 12/19/14 11:13 AM  Result Value Ref Range Status   Specimen Description SPUTUM  Final   Special Requests NONE  Final   Gram Stain   Final    ABUNDANT WBC PRESENT,BOTH PMN AND MONONUCLEAR RARE SQUAMOUS EPITHELIAL CELLS PRESENT FEW GRAM POSITIVE COCCI IN PAIRS IN CLUSTERS FEW GRAM POSITIVE RODS RARE GRAM NEGATIVE RODS    Culture PENDING  Incomplete   Report Status PENDING  Incomplete     Studies: Dg Chest 2 View  12/18/2014  CLINICAL DATA:  Sore throat, nasal congestion EXAM: CHEST  2 VIEW COMPARISON:  08/02/2014 FINDINGS: Cardiomediastinal silhouette is stable. Degenerative changes thoracic spine. Left subclavian Port-A-Cath is unchanged in position. There is streaky left basilar retrocardiac atelectasis or infiltrate. No pulmonary edema. IMPRESSION: Streaky left basilar retrocardiac atelectasis or infiltrate. No pulmonary edema. Electronically Signed   By: Lahoma Crocker M.D.   On: 12/18/2014 15:43    Scheduled Meds: . cholestyramine  4 g Oral BID  . heparin  5,000 Units Subcutaneous 3 times per day  . insulin aspart  0-15 Units Subcutaneous TID WC  . insulin aspart  0-5 Units Subcutaneous QHS  . insulin aspart protamine- aspart  10 Units Subcutaneous BID WC  . losartan  100 mg Oral Daily  . multivitamin with minerals  1 tablet Oral BID  . piperacillin-tazobactam (ZOSYN)  IV  3.375 g Intravenous Q8H  . sodium chloride  3 mL Intravenous Q12H  . tobramycin-dexamethasone  1 drop  Both Eyes BID  . vancomycin  1,250 mg Intravenous Q12H  . Vortioxetine HBr  20 mg Oral QHS   Continuous Infusions:    Time spent: > 35 minutes    Velvet Bathe  [facility] Pager [phone] If 7PM-7AM, please contact night-coverage at [email or web address], password [facility] 12/20/2014, 12:40 PM  LOS: 1 day

## 2014-12-21 ENCOUNTER — Encounter (HOSPITAL_BASED_OUTPATIENT_CLINIC_OR_DEPARTMENT_OTHER): Payer: Self-pay | Admitting: *Deleted

## 2014-12-21 ENCOUNTER — Encounter (HOSPITAL_COMMUNITY): Payer: Self-pay | Admitting: *Deleted

## 2014-12-21 DIAGNOSIS — C50411 Malignant neoplasm of upper-outer quadrant of right female breast: Secondary | ICD-10-CM

## 2014-12-21 DIAGNOSIS — R5381 Other malaise: Secondary | ICD-10-CM

## 2014-12-21 DIAGNOSIS — R059 Cough, unspecified: Secondary | ICD-10-CM | POA: Insufficient documentation

## 2014-12-21 DIAGNOSIS — J189 Pneumonia, unspecified organism: Principal | ICD-10-CM

## 2014-12-21 DIAGNOSIS — Z794 Long term (current) use of insulin: Secondary | ICD-10-CM

## 2014-12-21 DIAGNOSIS — R05 Cough: Secondary | ICD-10-CM

## 2014-12-21 DIAGNOSIS — D630 Anemia in neoplastic disease: Secondary | ICD-10-CM

## 2014-12-21 DIAGNOSIS — C773 Secondary and unspecified malignant neoplasm of axilla and upper limb lymph nodes: Secondary | ICD-10-CM

## 2014-12-21 DIAGNOSIS — R509 Fever, unspecified: Secondary | ICD-10-CM

## 2014-12-21 DIAGNOSIS — C50911 Malignant neoplasm of unspecified site of right female breast: Secondary | ICD-10-CM

## 2014-12-21 DIAGNOSIS — E119 Type 2 diabetes mellitus without complications: Secondary | ICD-10-CM

## 2014-12-21 LAB — LEGIONELLA PNEUMOPHILA SEROGP 1 UR AG: L. pneumophila Serogp 1 Ur Ag: NEGATIVE

## 2014-12-21 LAB — GLUCOSE, CAPILLARY
Glucose-Capillary: 164 mg/dL — ABNORMAL HIGH (ref 65–99)
Glucose-Capillary: 168 mg/dL — ABNORMAL HIGH (ref 65–99)

## 2014-12-21 MED ORDER — HEPARIN SOD (PORK) LOCK FLUSH 100 UNIT/ML IV SOLN
500.0000 [IU] | Freq: Once | INTRAVENOUS | Status: AC
  Administered 2014-12-21: 500 [IU] via INTRAVENOUS
  Filled 2014-12-21: qty 5

## 2014-12-21 MED ORDER — GUAIFENESIN-DM 100-10 MG/5ML PO SYRP: 5.0000 mL | ORAL_SOLUTION | ORAL | Status: DC | PRN

## 2014-12-21 MED ORDER — LEVOFLOXACIN 750 MG PO TABS: 750.0000 mg | ORAL_TABLET | Freq: Every day | ORAL | Status: AC

## 2014-12-21 NOTE — Discharge Summary (Signed)
Physician Discharge Summary  Darlene [name] YSA:[id number] DOB: [birth year] DOA: 12/18/2014  PCP: Darlene Sheller, MD  Admit date: 12/18/2014 Discharge date: 12/21/2014  Time spent: 25  minutes  Recommendations for Outpatient Follow-up:  1. Discharge home with outpatient follow-up with surgery and oncology. 2. Patient with complete total 7 days of oral Levaquin on 12/4   Discharge Diagnoses:  Principal problem Healthcare associated pneumonia  Active Problems:   Diabetes mellitus, type II, insulin dependent ([place])   Depression   OSA on CPAP   Breast cancer of upper-outer quadrant of right female breast ([place])   Anemia in neoplastic disease   Cough   Discharge Condition: Fair  Diet recommendation: Diabetic  Filed Weights   12/18/14 1941  Weight: 96.2 kg (212 lb 1.3 oz)    History of present illness:  Reason for to admission H&P for details, in brief, 65 year old female with history of right breast invasive ductal carcinoma grade 2, ER/PR negative, HER-2 amplified status post new adjuvant chemotherapy completed on 11/21/2014 currently on trastuzumab with plan on lumpectomy next week presented with subjective fevers with malaise, nonproductive cough. Patient reported flulike symptoms 2 days prior to admission. Chest x-ray showed possible left basilar retrocardiac infiltrate. Admitted for possible healthcare associated pneumonia.   Hospital Course:  Healthcare associated pneumonia Patient placed on empiric vancomycin and Zosyn. Blood culture, urine for strep antigen negative .Legionella antigen pending. She remains afebrile last 24 hours. Mild leukocytosis on presentation. Patient clinically stable and I will discharge her on oral Levaquin to complete a total 7 day course of antibiotic. -She will follow-up with Dr. Barry Dienes and Dr. Jana Hakim as outpatient.  Type 2 diabetes mellitus, insulin-dependent fsg is stable while in the hospital. Resume home dose.  Right breast  invasive ductal carcinoma Completed chemotherapy recently. On trastuzumab. Plan on right lumpectomy next week. Follow-up as outpatient.  Depression Stable. Continue medications.  Patient is clinically stable to be discharged home . Procedures:  None  Consultations: Dr. Jana Hakim   Discharge Exam: Filed Vitals:   12/20/14 2128 12/21/14 0526  BP: 128/48 149/54  Pulse: 71 85  Temp: 98.5 F (36.9 C) 98.6 F (37 C)  Resp: 18 20    General: Elderly female not in distress HEENT: No pallor, moist oral mucosa, no thrush, supple neck Chest: Clear to auscultation bilaterally CVS: Normal S1 and S2, no murmurs GI: Soft, nondistended, nontender Musculoskeletal: Warm, no edema CNS: Alert and oriented  Discharge Instructions    Current Discharge Medication List    START taking these medications   Details  guaiFENesin-dextromethorphan (ROBITUSSIN DM) 100-10 MG/5ML syrup Take 5 mLs by mouth every 4 (four) hours as needed for cough. Qty: 118 mL, Refills: 0    levofloxacin (LEVAQUIN) 750 MG tablet Take 1 tablet (750 mg total) by mouth daily. Qty: 5 tablet, Refills: 0      CONTINUE these medications which have NOT CHANGED   Details  acetaminophen (TYLENOL) 325 MG tablet Take 650 mg by mouth 2 (two) times daily as needed for moderate pain or headache.    Calcium Citrate-Vitamin D (CALCIUM CITRATE+D3 PO) Take 1,800 mg by mouth daily.     cholestyramine (QUESTRAN) 4 G packet Take 1 packet (4 g total) by mouth 2 (two) times daily. Qty: 60 each, Refills: 1    clotrimazole (LOTRIMIN) 1 % cream Apply 1 application topically 2 (two) times daily as needed (rash).    diphenhydrAMINE (BENADRYL) 25 MG tablet Take 25 mg by mouth daily as needed for allergies or sleep.  diphenoxylate-atropine (LOMOTIL) 2.5-0.025 MG per tablet Take 1 tablet by mouth 4 (four) times daily as needed for diarrhea or loose stools. Qty: 60 tablet, Refills: 1    loperamide (IMODIUM) 2 MG capsule Take 4 mg by  mouth as needed for diarrhea or loose stools.    losartan (COZAAR) 100 MG tablet Take 1 tablet by mouth daily.     Melatonin 5 MG TABS Take 5 mg by mouth at bedtime as needed (sleep).    mometasone (NASONEX) 50 MCG/ACT nasal spray Place 2 sprays into the nose daily as needed (allergies).     Multiple Vitamin (MULTIVITAMIN WITH MINERALS) TABS tablet Take 1 tablet by mouth 2 (two) times daily. With iron    NOVOLIN 70/30 (70-30) 100 UNIT/ML injection 10 units two times a day   Associated Diagnoses: Obesity hypoventilation syndrome ([place]); OSA on CPAP; Diabetes mellitus due to underlying condition with diabetic polyneuropathy (HCC)    ondansetron (ZOFRAN) 8 MG tablet Take 1 tablet (8 mg total) by mouth every 8 (eight) hours as needed for nausea or vomiting. Qty: 20 tablet, Refills: 1    Probiotic Product ([employer] PROBIOTIC PO) Take 1 capsule by mouth daily.    prochlorperazine (COMPAZINE) 10 MG tablet Take 1 tablet (10 mg total) by mouth every 6 (six) hours as needed for nausea or vomiting. Qty: 30 tablet, Refills: 1    tobramycin-dexamethasone (TOBRADEX) ophthalmic solution Place 1 drop into both eyes 2 (two) times daily. Qty: 5 mL, Refills: 1    Trastuzumab (HERCEPTIN IV) Inject into the vein. Infusion at [place] (BRINTELLIX) 20 MG TABS Take 20 mg by mouth daily. Qty: 90 tablet, Refills: 0    glucose blood (ONETOUCH VERIO) test strip 1 each by Other route as needed for other. Use as instructed      STOP taking these medications     aspirin 81 MG tablet        Allergies  Allergen Reactions  . Other Itching    Peanuts in large quantities   . Tape Hives and Rash   Follow-up Information    Follow up with Darlene Sheller, MD. Call in 1 week.   Specialty:  Internal Medicine   Contact information:   [address], [place]  [address] [phone]        The results of significant diagnostics from this hospitalization (including imaging,  microbiology, ancillary and laboratory) are listed below for reference.    Significant Diagnostic Studies: Dg Chest 2 View  12/18/2014  CLINICAL DATA:  Sore throat, nasal congestion EXAM: CHEST  2 VIEW COMPARISON:  08/02/2014 FINDINGS: Cardiomediastinal silhouette is stable. Degenerative changes thoracic spine. Left subclavian Port-A-Cath is unchanged in position. There is streaky left basilar retrocardiac atelectasis or infiltrate. No pulmonary edema. IMPRESSION: Streaky left basilar retrocardiac atelectasis or infiltrate. No pulmonary edema. Electronically Signed   By: Lahoma Crocker M.D.   On: 12/18/2014 15:43   Mr Breast Bilateral W Wo Contrast  11/22/2014  CLINICAL DATA:  Right breast invasive ductal carcinoma and positive right axillary lymph node biopsy in in June of 2016. Patient has completed 15 weeks of chemotherapy. The patient can still feel a right breast lump. Family history of breast cancer in grandmother at age 86. LABS:  None applicable EXAM: BILATERAL BREAST MRI WITH AND WITHOUT CONTRAST TECHNIQUE: Multiplanar, multisequence MR images of both breasts were obtained prior to and following the intravenous administration of 20 ml of MultiHance. THREE-DIMENSIONAL MR IMAGE RENDERING ON INDEPENDENT WORKSTATION: Three-dimensional MR  images were rendered by post-processing of the original MR data on an independent workstation. The three-dimensional MR images were interpreted, and findings are reported in the following complete MRI report for this study. Three dimensional images were evaluated at the independent Dyna[employer]ad workstation COMPARISON:  10/06/2014, 08/01/2014, and prior mammogram studies from [facility] dated 07/18/2014 and earlier FINDINGS: Breast composition: c. Heterogeneous fibroglandular tissue. Background parenchymal enhancement: Minimal Right breast: There has been interval resolution of non mass enhancement in the lateral aspect of the right breast. On the initial MRI, non mass  enhancement spans 9.9 x 7.8 x 9.0 cm. Left breast: No mass or abnormal enhancement. Lymph nodes: No suspicious or abnormal appearing lymph nodes identified on the current study Ancillary findings:  Left-sided Port-A-Cath. IMPRESSION: 1. Complete MRI response to neoadjuvant treatment. No residual enhancement in the right breast. 2. No suspicious lymph nodes in the right axilla following neoadjuvant treatment. RECOMMENDATION: Treatment plan BI-RADS CATEGORY  6: Known biopsy-proven malignancy. Electronically Signed   By: Nolon Nations M.D.   On: 11/22/2014 13:25    Microbiology: Recent Results (from the past 240 hour(s))  Culture, blood (routine x 2)     Status: None (Preliminary result)   Collection Time: 12/18/14  3:36 PM  Result Value Ref Range Status   Specimen Description BLOOD LEFT ARM  Final   Special Requests BOTTLES DRAWN AEROBIC AND ANAEROBIC 5CC  Final   Culture   Final    NO GROWTH 2 DAYS Performed at [facility]    Report Status PENDING  Incomplete  Culture, blood (routine x 2)     Status: None (Preliminary result)   Collection Time: 12/18/14  4:00 PM  Result Value Ref Range Status   Specimen Description PORTA CATH  Final   Special Requests BOTTLES DRAWN AEROBIC AND ANAEROBIC 5CC  Final   Culture   Final    NO GROWTH 2 DAYS Performed at [facility]    Report Status PENDING  Incomplete  Urine culture     Status: None   Collection Time: 12/18/14  4:35 PM  Result Value Ref Range Status   Specimen Description URINE, CLEAN CATCH  Final   Special Requests NONE  Final   Culture   Final    NO GROWTH 1 DAY Performed at [facility]    Report Status 12/19/2014 FINAL  Final  Culture, sputum-assessment     Status: None   Collection Time: 12/19/14 11:13 AM  Result Value Ref Range Status   Specimen Description SPUTUM  Final   Special Requests Immunocompromised  Final   Sputum evaluation   Final    THIS SPECIMEN IS ACCEPTABLE. RESPIRATORY CULTURE REPORT  TO FOLLOW.   Report Status 12/19/2014 FINAL  Final  Culture, respiratory (NON-Expectorated)     Status: None (Preliminary result)   Collection Time: 12/19/14 11:13 AM  Result Value Ref Range Status   Specimen Description SPUTUM  Final   Special Requests NONE  Final   Gram Stain   Final    ABUNDANT WBC PRESENT,BOTH PMN AND MONONUCLEAR RARE SQUAMOUS EPITHELIAL CELLS PRESENT FEW GRAM POSITIVE COCCI IN PAIRS IN CLUSTERS FEW GRAM POSITIVE RODS RARE GRAM NEGATIVE RODS    Culture   Final    NORMAL OROPHARYNGEAL FLORA Performed at [facility]    Report Status PENDING  Incomplete     Labs: Basic Metabolic Panel:  Recent Labs Lab 12/18/14 1540 12/19/14 0501  NA 141 137  K 4.1 4.0  CL 108  105  CO2 26 26  GLUCOSE 182* 201*  BUN 18 18  CREATININE 1.02* 1.09*  CALCIUM 8.9 8.5*   Liver Function Tests:  Recent Labs Lab 12/18/14 1540  AST 21  ALT 22  ALKPHOS 73  BILITOT 1.0  PROT 6.0*  ALBUMIN 3.6   No results for input(s): LIPASE, AMYLASE in the last 168 hours. No results for input(s): AMMONIA in the last 168 hours. CBC:  Recent Labs Lab 12/18/14 1540 12/19/14 0501 12/20/14 0605  WBC 13.5* 15.9* 14.2*  NEUTROABS 12.1*  --   --   HGB 10.8* 9.1* 8.8*  HCT 33.3* 27.9* 26.9*  MCV 97.4 95.9 94.7  PLT 179 170 173   Cardiac Enzymes: No results for input(s): CKTOTAL, CKMB, CKMBINDEX, TROPONINI in the last 168 hours. BNP: BNP (last 3 results) No results for input(s): BNP in the last 8760 hours.  ProBNP (last 3 results) No results for input(s): PROBNP in the last 8760 hours.  CBG:  Recent Labs Lab 12/20/14 0903 12/20/14 1224 12/20/14 1750 12/20/14 2126 12/21/14 0734  GLUCAP 215* 178* 268* 164* 168*       Signed:  DHUNGEL, NISHANT  [facility] 12/21/2014, 10:01 AM

## 2014-12-21 NOTE — Discharge Instructions (Signed)
Community-Acquired Pneumonia, Adult °Pneumonia is an infection of the lungs. One type of pneumonia can happen while a person is in a hospital. A different type can happen when a person is not in a hospital (community-acquired pneumonia). It is easy for this kind to spread from person to person. It can spread to you if you breathe near an infected person who coughs or sneezes. Some symptoms include: °· A dry cough. °· A wet (productive) cough. °· Fever. °· Sweating. °· Chest pain. °HOME CARE °· Take over-the-counter and prescription medicines only as told by your doctor. °¨ Only take cough medicine if you are losing sleep. °¨ If you were prescribed an antibiotic medicine, take it as told by your doctor. Do not stop taking the antibiotic even if you start to feel better. °· Sleep with your head and neck raised (elevated). You can do this by putting a few pillows under your head, or you can sleep in a recliner. °· Do not use tobacco products. These include cigarettes, chewing tobacco, and e-cigarettes. If you need help quitting, ask your doctor. °· Drink enough water to keep your pee (urine) clear or pale yellow. °A shot (vaccine) can help prevent pneumonia. Shots are often suggested for: °· People older than 62 years of age. °· People older than 62 years of age: °¨ Who are having cancer treatment. °¨ Who have long-term (chronic) lung disease. °¨ Who have problems with their body's defense system (immune system). °You may also prevent pneumonia if you take these actions: °· Get the flu (influenza) shot every year. °· Go to the dentist as often as told. °· Wash your hands often. If soap and water are not available, use hand sanitizer. °GET HELP IF: °· You have a fever. °· You lose sleep because your cough medicine does not help. °GET HELP RIGHT AWAY IF: °· You are short of breath and it gets worse. °· You have more chest pain. °· Your sickness gets worse. This is very serious if: °¨ You are an older adult. °¨ Your  body's defense system is weak. °· You cough up blood. °  °This information is not intended to replace advice given to you by your health care provider. Make sure you discuss any questions you have with your health care provider. °  °Document Released: 06/26/2007 Document Revised: 09/28/2014 Document Reviewed: 05/04/2014 °Elsevier Interactive Patient Education ©2016 [employer] ° °

## 2014-12-21 NOTE — Progress Notes (Signed)
Patient is currently in [facility] hospital with pneumonia, plan to D/C home today. Chart reviewed by Dr Kalman Shan, and OK to proceed with surgery.

## 2014-12-21 NOTE — Progress Notes (Signed)
Discharge instructions reviewed with pt. No current questions or concerns. Prescriptions given. Leaving with husband shortly

## 2014-12-21 NOTE — Progress Notes (Signed)
Darlene [name]   DOB:09/22/1950   OA#:[id number]   SWF#:[id number]  Subjective: feels better, denies purulent sputum, pleurisy; ambulated halls yesterday w/o event; anxious about scheduling of lumpectomy (currently 12/27/2014); hoping for discharge today; husband in room   Objective: middle aged white woman exmained in bed Filed Vitals:   12/20/14 2128 12/21/14 0526  BP: 128/48 149/54  Pulse: 71 85  Temp: 98.5 F (36.9 C) 98.6 F (37 C)  Resp: 18 20    Body mass index is 31.3 kg/(m^2).  Intake/Output Summary (Last 24 hours) at 12/21/14 0830 Last data filed at 12/21/14 0524  Gross per 24 hour  Intake    960 ml  Output      0 ml  Net    960 ml     Sclerae unicteric  Lungs no rales or rhonchi--auscultated anterolaterally  Heart regular rate and rhythm  Abdomen benign  Neuro nonfocal  Breast exam: deferred  CBG (last 3)   Recent Labs  12/20/14 1750 12/20/14 2126 12/21/14 0734  GLUCAP 268* 164* 168*     Labs:  Lab Results  Component Value Date   WBC 14.2* 12/20/2014   HGB 8.8* 12/20/2014   HCT 26.9* 12/20/2014   MCV 94.7 12/20/2014   PLT 173 12/20/2014   NEUTROABS 12.1* 12/18/2014    _0 @  Urine Studies No results for input(s): UHGB, CRYS in the last 72 hours.  Invalid input(s): UACOL, UAPR, USPG, UPH, UTP, UGL, UKET, UBIL, UNIT, UROB, ULEU, UEPI, UWBC, URBC, UBAC, CAST, UCOM, BILUA  Basic Metabolic Panel:  Recent Labs Lab 12/18/14 1540 12/19/14 0501  NA 141 137  K 4.1 4.0  CL 108 105  CO2 26 26  GLUCOSE 182* 201*  BUN 18 18  CREATININE 1.02* 1.09*  CALCIUM 8.9 8.5*   GFR Estimated Creatinine Clearance: 66.1 mL/min (by C-G formula based on Cr of 1.09). Liver Function Tests:  Recent Labs Lab 12/18/14 1540  AST 21  ALT 22  ALKPHOS 73  BILITOT 1.0  PROT 6.0*  ALBUMIN 3.6   No results for input(s): LIPASE, AMYLASE in the last 168 hours. No results for input(s): AMMONIA in the last 168 hours. Coagulation profile No results  for input(s): INR, PROTIME in the last 168 hours.  CBC:  Recent Labs Lab 12/18/14 1540 12/19/14 0501 12/20/14 0605  WBC 13.5* 15.9* 14.2*  NEUTROABS 12.1*  --   --   HGB 10.8* 9.1* 8.8*  HCT 33.3* 27.9* 26.9*  MCV 97.4 95.9 94.7  PLT 179 170 173   Cardiac Enzymes: No results for input(s): CKTOTAL, CKMB, CKMBINDEX, TROPONINI in the last 168 hours. BNP: Invalid input(s): POCBNP CBG:  Recent Labs Lab 12/20/14 0903 12/20/14 1224 12/20/14 1750 12/20/14 2126 12/21/14 0734  GLUCAP 215* 178* 268* 164* 168*   D-Dimer No results for input(s): DDIMER in the last 72 hours. Hgb A1c No results for input(s): HGBA1C in the last 72 hours. Lipid Profile No results for input(s): CHOL, HDL, LDLCALC, TRIG, CHOLHDL, LDLDIRECT in the last 72 hours. Thyroid function studies No results for input(s): TSH, T4TOTAL, T3FREE, THYROIDAB in the last 72 hours.  Invalid input(s): FREET3 Anemia work up No results for input(s): VITAMINB12, FOLATE, FERRITIN, TIBC, IRON, RETICCTPCT in the last 72 hours. Microbiology Recent Results (from the past 240 hour(s))  Culture, blood (routine x 2)     Status: None (Preliminary result)   Collection Time: 12/18/14  3:36 PM  Result Value Ref Range Status   Specimen Description BLOOD LEFT ARM  Final  Special Requests BOTTLES DRAWN AEROBIC AND ANAEROBIC 5CC  Final   Culture   Final    NO GROWTH 2 DAYS Performed at [facility]    Report Status PENDING  Incomplete  Culture, blood (routine x 2)     Status: None (Preliminary result)   Collection Time: 12/18/14  4:00 PM  Result Value Ref Range Status   Specimen Description PORTA CATH  Final   Special Requests BOTTLES DRAWN AEROBIC AND ANAEROBIC 5CC  Final   Culture   Final    NO GROWTH 2 DAYS Performed at [facility]    Report Status PENDING  Incomplete  Urine culture     Status: None   Collection Time: 12/18/14  4:35 PM  Result Value Ref Range Status   Specimen Description URINE, CLEAN  CATCH  Final   Special Requests NONE  Final   Culture   Final    NO GROWTH 1 DAY Performed at [facility]    Report Status 12/19/2014 FINAL  Final  Culture, sputum-assessment     Status: None   Collection Time: 12/19/14 11:13 AM  Result Value Ref Range Status   Specimen Description SPUTUM  Final   Special Requests Immunocompromised  Final   Sputum evaluation   Final    THIS SPECIMEN IS ACCEPTABLE. RESPIRATORY CULTURE REPORT TO FOLLOW.   Report Status 12/19/2014 FINAL  Final  Culture, respiratory (NON-Expectorated)     Status: None (Preliminary result)   Collection Time: 12/19/14 11:13 AM  Result Value Ref Range Status   Specimen Description SPUTUM  Final   Special Requests NONE  Final   Gram Stain   Final    ABUNDANT WBC PRESENT,BOTH PMN AND MONONUCLEAR RARE SQUAMOUS EPITHELIAL CELLS PRESENT FEW GRAM POSITIVE COCCI IN PAIRS IN CLUSTERS FEW GRAM POSITIVE RODS RARE GRAM NEGATIVE RODS    Culture   Final    NORMAL OROPHARYNGEAL FLORA Performed at [facility]    Report Status PENDING  Incomplete      Studies:  CLINICAL DATA: Sore throat, nasal congestion  EXAM: CHEST 2 VIEW  COMPARISON: 08/02/2014  FINDINGS: Cardiomediastinal silhouette is stable. Degenerative changes thoracic spine. Left subclavian Port-A-Cath is unchanged in position. There is streaky left basilar retrocardiac atelectasis or infiltrate. No pulmonary edema.  IMPRESSION: Streaky left basilar retrocardiac atelectasis or infiltrate. No pulmonary edema.   Electronically Signed  By: Lahoma Crocker M.D.  On: 12/18/2014 15:43   Assessment: 64 y.o. 64 y.o. [place] woman status post right breast and right axillary lymph node biopsy 07/19/2014 for a cT2 pN1, stage IIB invasive ductal carcinoma, grade 2, estrogen and progesterone receptor negative, with an MIB-1 of 90%, and HER-2 amplified  (1) neoadjuvant chemotherapy started 08/08/2014, consisting of carboplatin,  docetaxel, trastuzumab and pertuzumab given every 3 weeks 6, completed 11/21/2014 (a) breast MRI after initial 3 cycles shows a significant response (b) breast MRI after 6 cycles shows a complete radiologic response   (2) trastuzumab to be continued to complete one year (through mid-July 2017) (a) echo 11/08/2014 shows an EF of 66%  (3) Definitive surgery to follow chemotherapy, with consideration of the Alliance trial  (4) adjuvant radiation to follow surgery  (5) admitted 12/18/2014 with fever, malaise, but minimal cough, nonproductive; ANC 12.1 and CXR showing possible early pneumonia-- now day 4 vanco/zosyn, with negative blood and urine cultures  Plan:  I would not be uncomfortable discharging patient on augmentin or other abx of your choice. I will let Dr Barry Dienes know of  developments as she may want to delay the planned surgery or at least re-evaluate the patient before proceeding next week.  Greatly appreciate your help to Ms Potteiger!   Chauncey Cruel, MD 12/21/2014  8:30 AM Medical Oncology and Hematology [facility] [address] [place], [place] [address] Tel. [phone]    Fax. [phone]

## 2014-12-22 LAB — CULTURE, RESPIRATORY W GRAM STAIN: Culture: NORMAL

## 2014-12-23 LAB — CULTURE, BLOOD (ROUTINE X 2)
Culture: NO GROWTH
Culture: NO GROWTH

## 2014-12-24 NOTE — H&P (Addendum)
  Subjective:    Patient ID: Darlene [name] is a 64 y.o. female.  HPI  Patient presented with palpable lump in her right breast. Bilateral diagnostic MMG with tomosynthesis (06/2014) demonstrated right UOQ focal asymmetry. US showed 1.7 mass and abnormal-appearing lymph node in the right axillary tail. Biopsy of the right breast mass and LN both were positive for IDC, ER/PR -, Her 2 +. MRI noted normal left breast; right breast mass part of a larger non mass clumped area of enhancement, which involves lateral central breast measuring 6.9 by 5.8 by 6.2 cm. Additionally, numerous few mm satellite nodules extend anteriorly, inferiorly and superiorly to this more confluent non mass regional area of enhancement. She completed first week 11/2014 neoadjuvant chemotherapy. She has seen Dr. Valere Dross in consultation and he noted that if she goes on to have a mastectomy along with an axillary lymph node dissection with no evidence for residual disease within the breast or axilla, one could consider not giving her post mastectomy radiation therapy.Interval MRI 09/2014 with improvement in size of both mass/NME and axillary node size, though present. Final MRI 11/22/14 with resolution of NME andaxillary adenopathy.  Plan for breast conservation followed by bilateral breast reconstruction (oncoplastic right reconstruction, left breast reduction for symmetry). She was recently admitted for PNA.   History significant bariatric surgery with Dr. Hassell Done. Current 46 DDD, prior to wt loss G cup Highest wt 332 lb, current 217 and goal 175 lb Last HbA1c 7.1 -scheduled for repeat tomorrow with PCP.  Review of Systems     Objective:   Physical Exam  Cardiovascular: Normal rate, regular rhythm and normal heart sounds.  Pulmonary/Chest: Effort normal and breath sounds normal.  Abdominal: Soft.  Genitourinary: No breast discharge.  firmness/induration UOQ right breast without definite mass Grade 3 ptosis  bilateral Right > left volume SN to nipple R 43.5 L 43 cm BW R 22 L 22 cm Nipple to IMF R 17 L 18 cm  Lymphadenopathy:  She has no axillary adenopathy.  Not palpable  Skin:  Rodney Booze 2       Assessment:     R breast cancer  Neoadjuvant chemotherapy     Plan:     Plan oncoplastic reconstruction on this right breast with left breast reduction for symmetry. Reviewed reduction with anchor type scars, drains, post operative visits and limitations, recovery. Diminished sensation nipple and breast skin, risk of nipple loss, wound healing problems, asymmetry. In this scenario, she would require XRT and smaller breast size may aid with this. Discussed will have some contraction of breast volume and goal to leave this side a litter larger volume wise. Plan surgery week following lumpectomy to ensure clear pathology. Reviewed that results are not permanent, that further significant wt loss post reduction will cause ptosis, limit aesthetic benefit of lift, radiated breast will not develop ptosis to same degree as non radiated breast, cannot guarantee breast size.   [name], MD [facility] [phone]  12/28/14 ADDENDUM: Per verbal report pathology wt lumpectomy specimen appt 90 g.

## 2014-12-26 NOTE — Progress Notes (Signed)
Dr. Sheliah Hatch reviewed chart- will check hemoglobin on day of surgery.

## 2014-12-27 ENCOUNTER — Ambulatory Visit (HOSPITAL_BASED_OUTPATIENT_CLINIC_OR_DEPARTMENT_OTHER): Payer: BLUE CROSS/BLUE SHIELD | Admitting: Anesthesiology

## 2014-12-27 ENCOUNTER — Encounter (HOSPITAL_COMMUNITY)
Admission: RE | Admit: 2014-12-27 | Discharge: 2014-12-27 | Disposition: A | Discharge status: Home / Self Care | Payer: BLUE CROSS/BLUE SHIELD | Source: Ambulatory Visit | Attending: General Surgery | Admitting: General Surgery

## 2014-12-27 ENCOUNTER — Ambulatory Visit (HOSPITAL_BASED_OUTPATIENT_CLINIC_OR_DEPARTMENT_OTHER)
Admission: RE | Admit: 2014-12-27 | Discharge: 2014-12-27 | Disposition: A | Discharge status: Home / Self Care | Payer: BLUE CROSS/BLUE SHIELD | Source: Ambulatory Visit | Attending: General Surgery | Admitting: General Surgery

## 2014-12-27 ENCOUNTER — Encounter (HOSPITAL_BASED_OUTPATIENT_CLINIC_OR_DEPARTMENT_OTHER): Payer: Self-pay | Admitting: General Surgery

## 2014-12-27 ENCOUNTER — Encounter (HOSPITAL_BASED_OUTPATIENT_CLINIC_OR_DEPARTMENT_OTHER)
Admission: RE | Disposition: A | Discharge status: Home / Self Care | Payer: Self-pay | Source: Ambulatory Visit | Attending: General Surgery

## 2014-12-27 DIAGNOSIS — Z9221 Personal history of antineoplastic chemotherapy: Secondary | ICD-10-CM | POA: Diagnosis not present

## 2014-12-27 DIAGNOSIS — C50411 Malignant neoplasm of upper-outer quadrant of right female breast: Secondary | ICD-10-CM | POA: Diagnosis not present

## 2014-12-27 DIAGNOSIS — C50911 Malignant neoplasm of unspecified site of right female breast: Secondary | ICD-10-CM | POA: Diagnosis present

## 2014-12-27 HISTORY — DX: Pneumonia, unspecified organism: J18.9

## 2014-12-27 HISTORY — PX: BREAST LUMPECTOMY WITH RADIOACTIVE SEED AND SENTINEL LYMPH NODE BIOPSY: SHX6550

## 2014-12-27 LAB — GLUCOSE, CAPILLARY
Glucose-Capillary: 114 mg/dL — ABNORMAL HIGH (ref 65–99)
Glucose-Capillary: 197 mg/dL — ABNORMAL HIGH (ref 65–99)

## 2014-12-27 LAB — POCT HEMOGLOBIN-HEMACUE: Hemoglobin: 9.3 g/dL — ABNORMAL LOW (ref 12.0–15.0)

## 2014-12-27 SURGERY — BREAST LUMPECTOMY WITH RADIOACTIVE SEED AND SENTINEL LYMPH NODE BIOPSY
Anesthesia: Regional | Site: Breast | Laterality: Right

## 2014-12-27 MED ORDER — SODIUM CHLORIDE 0.9 % IJ SOLN
INTRAMUSCULAR | Status: AC
  Filled 2014-12-27: qty 10

## 2014-12-27 MED ORDER — LACTATED RINGERS IV SOLN
INTRAVENOUS | Status: DC
  Administered 2014-12-27 (×2): via INTRAVENOUS

## 2014-12-27 MED ORDER — OXYCODONE-ACETAMINOPHEN 5-325 MG PO TABS: 1.0000 | ORAL_TABLET | ORAL | Status: DC | PRN

## 2014-12-27 MED ORDER — FENTANYL CITRATE (PF) 100 MCG/2ML IJ SOLN
50.0000 ug | INTRAMUSCULAR | Status: AC | PRN
  Administered 2014-12-27 (×2): 50 ug via INTRAVENOUS
  Administered 2014-12-27: 100 ug via INTRAVENOUS
  Administered 2014-12-27: 50 ug via INTRAVENOUS

## 2014-12-27 MED ORDER — PROMETHAZINE HCL 25 MG/ML IJ SOLN: 6.2500 mg | INTRAMUSCULAR | Status: DC | PRN

## 2014-12-27 MED ORDER — EPHEDRINE SULFATE 50 MG/ML IJ SOLN
INTRAMUSCULAR | Status: AC
  Filled 2014-12-27: qty 1

## 2014-12-27 MED ORDER — FENTANYL CITRATE (PF) 100 MCG/2ML IJ SOLN
INTRAMUSCULAR | Status: AC
  Filled 2014-12-27: qty 2

## 2014-12-27 MED ORDER — PROPOFOL 500 MG/50ML IV EMUL
INTRAVENOUS | Status: AC
  Filled 2014-12-27: qty 50

## 2014-12-27 MED ORDER — BUPIVACAINE HCL (PF) 0.25 % IJ SOLN
INTRAMUSCULAR | Status: AC
  Filled 2014-12-27: qty 60

## 2014-12-27 MED ORDER — SUCCINYLCHOLINE CHLORIDE 20 MG/ML IJ SOLN
INTRAMUSCULAR | Status: AC
  Filled 2014-12-27: qty 1

## 2014-12-27 MED ORDER — OXYCODONE HCL 5 MG PO TABS: 5.0000 mg | ORAL_TABLET | ORAL | Status: DC | PRN

## 2014-12-27 MED ORDER — ONDANSETRON HCL 4 MG/2ML IJ SOLN
INTRAMUSCULAR | Status: DC | PRN
  Administered 2014-12-27: 4 mg via INTRAVENOUS

## 2014-12-27 MED ORDER — GLYCOPYRROLATE 0.2 MG/ML IJ SOLN: 0.2000 mg | Freq: Once | INTRAMUSCULAR | Status: DC | PRN

## 2014-12-27 MED ORDER — SODIUM CHLORIDE 0.9 % IJ SOLN: 3.0000 mL | INTRAMUSCULAR | Status: DC | PRN

## 2014-12-27 MED ORDER — LIDOCAINE-EPINEPHRINE 1 %-1:100000 IJ SOLN
INTRAMUSCULAR | Status: DC | PRN
  Administered 2014-12-27: 20 mL

## 2014-12-27 MED ORDER — DEXAMETHASONE SODIUM PHOSPHATE 10 MG/ML IJ SOLN
INTRAMUSCULAR | Status: AC
  Filled 2014-12-27: qty 1

## 2014-12-27 MED ORDER — ACETAMINOPHEN 650 MG RE SUPP: 650.0000 mg | RECTAL | Status: DC | PRN

## 2014-12-27 MED ORDER — FENTANYL CITRATE (PF) 100 MCG/2ML IJ SOLN
25.0000 ug | INTRAMUSCULAR | Status: DC | PRN
Start: 2014-12-27 — End: 2014-12-27
  Administered 2014-12-27 (×3): 50 ug via INTRAVENOUS

## 2014-12-27 MED ORDER — CEFAZOLIN SODIUM-DEXTROSE 2-3 GM-% IV SOLR
2.0000 g | INTRAVENOUS | Status: AC
  Administered 2014-12-27: 2 g via INTRAVENOUS

## 2014-12-27 MED ORDER — LIDOCAINE HCL (CARDIAC) 20 MG/ML IV SOLN
INTRAVENOUS | Status: DC | PRN
  Administered 2014-12-27: 30 mg via INTRAVENOUS

## 2014-12-27 MED ORDER — METHYLENE BLUE 1 % INJ SOLN
INTRAMUSCULAR | Status: AC
  Filled 2014-12-27: qty 10

## 2014-12-27 MED ORDER — TECHNETIUM TC 99M SULFUR COLLOID FILTERED
1.0000 | Freq: Once | INTRAVENOUS | Status: AC | PRN
  Administered 2014-12-27: 1 via INTRADERMAL

## 2014-12-27 MED ORDER — SODIUM CHLORIDE 0.9 % IJ SOLN: 3.0000 mL | Freq: Two times a day (BID) | INTRAMUSCULAR | Status: DC

## 2014-12-27 MED ORDER — DEXAMETHASONE SODIUM PHOSPHATE 4 MG/ML IJ SOLN
INTRAMUSCULAR | Status: DC | PRN
  Administered 2014-12-27: 10 mg via INTRAVENOUS

## 2014-12-27 MED ORDER — CEFAZOLIN SODIUM-DEXTROSE 2-3 GM-% IV SOLR
INTRAVENOUS | Status: AC
Start: 2014-12-27 — End: 2014-12-27
  Filled 2014-12-27: qty 50

## 2014-12-27 MED ORDER — MIDAZOLAM HCL 2 MG/2ML IJ SOLN
INTRAMUSCULAR | Status: AC
  Filled 2014-12-27: qty 2

## 2014-12-27 MED ORDER — ONDANSETRON HCL 4 MG/2ML IJ SOLN
INTRAMUSCULAR | Status: AC
  Filled 2014-12-27: qty 2

## 2014-12-27 MED ORDER — LIDOCAINE-EPINEPHRINE 1 %-1:100000 IJ SOLN
INTRAMUSCULAR | Status: AC
  Filled 2014-12-27: qty 2

## 2014-12-27 MED ORDER — PROPOFOL 10 MG/ML IV BOLUS
INTRAVENOUS | Status: AC
  Filled 2014-12-27: qty 20

## 2014-12-27 MED ORDER — SODIUM CHLORIDE 0.9 % IV SOLN: 250.0000 mL | INTRAVENOUS | Status: DC | PRN

## 2014-12-27 MED ORDER — BUPIVACAINE-EPINEPHRINE (PF) 0.5% -1:200000 IJ SOLN
INTRAMUSCULAR | Status: DC | PRN
  Administered 2014-12-27: 30 mL via PERINEURAL

## 2014-12-27 MED ORDER — MIDAZOLAM HCL 2 MG/2ML IJ SOLN
1.0000 mg | INTRAMUSCULAR | Status: DC | PRN
  Administered 2014-12-27: 1 mg via INTRAVENOUS
  Administered 2014-12-27: 2 mg via INTRAVENOUS

## 2014-12-27 MED ORDER — ACETAMINOPHEN 325 MG PO TABS: 650.0000 mg | ORAL_TABLET | ORAL | Status: DC | PRN

## 2014-12-27 MED ORDER — PROPOFOL 10 MG/ML IV BOLUS
INTRAVENOUS | Status: DC | PRN
  Administered 2014-12-27: 150 mg via INTRAVENOUS

## 2014-12-27 MED ORDER — SCOPOLAMINE 1 MG/3DAYS TD PT72: 1.0000 | MEDICATED_PATCH | Freq: Once | TRANSDERMAL | Status: DC | PRN

## 2014-12-27 SURGICAL SUPPLY — 72 items
APPLIER CLIP 9.375 MED OPEN (MISCELLANEOUS)
BINDER BREAST LRG (GAUZE/BANDAGES/DRESSINGS) IMPLANT
BINDER BREAST MEDIUM (GAUZE/BANDAGES/DRESSINGS) IMPLANT
BINDER BREAST XLRG (GAUZE/BANDAGES/DRESSINGS) IMPLANT
BINDER BREAST XXLRG (GAUZE/BANDAGES/DRESSINGS) ×2 IMPLANT
BLADE HEX COATED 2.75 (ELECTRODE) ×2 IMPLANT
BLADE SURG 10 STRL SS (BLADE) ×2 IMPLANT
BLADE SURG 15 STRL LF DISP TIS (BLADE) ×1 IMPLANT
BLADE SURG 15 STRL SS (BLADE) ×1
BNDG COHESIVE 4X5 TAN STRL (GAUZE/BANDAGES/DRESSINGS) ×2 IMPLANT
CANISTER SUC SOCK COL 7IN (MISCELLANEOUS) IMPLANT
CANISTER SUCT 1200ML W/VALVE (MISCELLANEOUS) ×2 IMPLANT
CHLORAPREP W/TINT 26ML (MISCELLANEOUS) ×2 IMPLANT
CLIP APPLIE 9.375 MED OPEN (MISCELLANEOUS) IMPLANT
CLIP TI LARGE 6 (CLIP) ×4 IMPLANT
CLIP TI MEDIUM 6 (CLIP) ×4 IMPLANT
CLIP TI WIDE RED SMALL 6 (CLIP) IMPLANT
CLSR STERI-STRIP ANTIMIC 1/2X4 (GAUZE/BANDAGES/DRESSINGS) ×2 IMPLANT
COVER MAYO STAND STRL (DRAPES) ×2 IMPLANT
COVER PROBE W GEL 5X96 (DRAPES) ×2 IMPLANT
DECANTER SPIKE VIAL GLASS SM (MISCELLANEOUS) IMPLANT
DERMABOND ADVANCED (GAUZE/BANDAGES/DRESSINGS) ×1
DERMABOND ADVANCED .7 DNX12 (GAUZE/BANDAGES/DRESSINGS) ×1 IMPLANT
DEVICE DUBIN W/COMP PLATE 8390 (MISCELLANEOUS) ×4 IMPLANT
DRAPE UTILITY XL STRL (DRAPES) ×2 IMPLANT
DRSG PAD ABDOMINAL 8X10 ST (GAUZE/BANDAGES/DRESSINGS) IMPLANT
DRSG TEGADERM 4X10 (GAUZE/BANDAGES/DRESSINGS) ×4 IMPLANT
ELECT BLADE 4.0 EZ CLEAN MEGAD (MISCELLANEOUS) ×2
ELECT REM PT RETURN 9FT ADLT (ELECTROSURGICAL) ×2
ELECTRODE BLDE 4.0 EZ CLN MEGD (MISCELLANEOUS) ×1 IMPLANT
ELECTRODE REM PT RTRN 9FT ADLT (ELECTROSURGICAL) ×1 IMPLANT
GLOVE BIO SURGEON STRL SZ 6 (GLOVE) ×2 IMPLANT
GLOVE BIO SURGEON STRL SZ 6.5 (GLOVE) ×4 IMPLANT
GLOVE BIOGEL PI IND STRL 6.5 (GLOVE) ×1 IMPLANT
GLOVE BIOGEL PI IND STRL 7.0 (GLOVE) ×4 IMPLANT
GLOVE BIOGEL PI INDICATOR 6.5 (GLOVE) ×1
GLOVE BIOGEL PI INDICATOR 7.0 (GLOVE) ×4
GLOVE SURG SS PI 7.0 STRL IVOR (GLOVE) ×8 IMPLANT
GOWN STRL REUS W/ TWL LRG LVL3 (GOWN DISPOSABLE) ×5 IMPLANT
GOWN STRL REUS W/TWL 2XL LVL3 (GOWN DISPOSABLE) ×2 IMPLANT
GOWN STRL REUS W/TWL LRG LVL3 (GOWN DISPOSABLE) ×5
ILLUMINATOR WAVEGUIDE N/F (MISCELLANEOUS) IMPLANT
KIT MARKER MARGIN INK (KITS) ×2 IMPLANT
LIGHT WAVEGUIDE WIDE FLAT (MISCELLANEOUS) ×2 IMPLANT
NDL SAFETY ECLIPSE 18X1.5 (NEEDLE) IMPLANT
NEEDLE HYPO 18GX1.5 SHARP (NEEDLE)
NEEDLE HYPO 25X1 1.5 SAFETY (NEEDLE) ×2 IMPLANT
NS IRRIG 1000ML POUR BTL (IV SOLUTION) ×2 IMPLANT
PACK BASIN DAY SURGERY FS (CUSTOM PROCEDURE TRAY) ×2 IMPLANT
PACK UNIVERSAL I (CUSTOM PROCEDURE TRAY) ×2 IMPLANT
PENCIL BUTTON HOLSTER BLD 10FT (ELECTRODE) ×2 IMPLANT
SLEEVE SCD COMPRESS KNEE MED (MISCELLANEOUS) ×2 IMPLANT
SLEEVE SURGEON STRL (DRAPES) ×2 IMPLANT
SPONGE GAUZE 4X4 12PLY STER LF (GAUZE/BANDAGES/DRESSINGS) ×2 IMPLANT
SPONGE LAP 18X18 X RAY DECT (DISPOSABLE) ×4 IMPLANT
STAPLER VISISTAT 35W (STAPLE) IMPLANT
STOCKINETTE IMPERVIOUS LG (DRAPES) ×2 IMPLANT
STRIP CLOSURE SKIN 1/2X4 (GAUZE/BANDAGES/DRESSINGS) ×2 IMPLANT
SUT ETHILON 2 0 FS 18 (SUTURE) IMPLANT
SUT MNCRL AB 4-0 PS2 18 (SUTURE) ×2 IMPLANT
SUT MON AB 5-0 PS2 18 (SUTURE) IMPLANT
SUT SILK 2 0 SH (SUTURE) IMPLANT
SUT VIC AB 2-0 SH 27 (SUTURE) ×1
SUT VIC AB 2-0 SH 27XBRD (SUTURE) ×1 IMPLANT
SUT VIC AB 3-0 SH 27 (SUTURE) ×1
SUT VIC AB 3-0 SH 27X BRD (SUTURE) ×1 IMPLANT
SUT VIC AB 5-0 PS2 18 (SUTURE) IMPLANT
SYR CONTROL 10ML LL (SYRINGE) ×2 IMPLANT
TOWEL OR 17X24 6PK STRL BLUE (TOWEL DISPOSABLE) ×2 IMPLANT
TOWEL OR NON WOVEN STRL DISP B (DISPOSABLE) ×2 IMPLANT
TUBE CONNECTING 20X1/4 (TUBING) ×2 IMPLANT
YANKAUER SUCT BULB TIP NO VENT (SUCTIONS) IMPLANT

## 2014-12-27 NOTE — Interval H&P Note (Signed)
History and Physical Interval Note:  12/27/2014 10:15 AM  Darlene [name]  has presented today for surgery, with the diagnosis of RIGHT BREAST CANCER  The various methods of treatment have been discussed with the patient and family. After consideration of risks, benefits and other options for treatment, the patient has consented to  Procedure(s): BREAST LUMPECTOMY WITH RADIOACTIVE SEED AND SENTINEL LYMPH NODE BIOPSY (Right) as a surgical intervention .  The patient's history has been reviewed, patient examined, no change in status, stable for surgery.  I have reviewed the patient's chart and labs.  Questions were answered to the patient's satisfaction.     BYERLY,FAERA

## 2014-12-27 NOTE — Discharge Instructions (Addendum)
[facility] °Office Phone Number [phone] ° °BREAST BIOPSY/ PARTIAL MASTECTOMY: POST OP INSTRUCTIONS ° °Always review your discharge instruction sheet given to you by the facility where your surgery was performed. ° °IF YOU HAVE DISABILITY OR FAMILY LEAVE FORMS, YOU MUST BRING THEM TO THE OFFICE FOR PROCESSING.  DO NOT GIVE THEM TO YOUR DOCTOR. ° °1. A prescription for pain medication may be given to you upon discharge.  Take your pain medication as prescribed, if needed.  If narcotic pain medicine is not needed, then you may take acetaminophen (Tylenol) or ibuprofen (Advil) as needed. °2. Take your usually prescribed medications unless otherwise directed °3. If you need a refill on your pain medication, please contact your pharmacy.  They will contact our office to request authorization.  Prescriptions will not be filled after 5pm or on week-ends. °4. You should eat very light the first 24 hours after surgery, such as soup, crackers, pudding, etc.  Resume your normal diet the day after surgery. °5. Most patients will experience some swelling and bruising in the breast.  Ice packs and a good support bra will help.  Swelling and bruising can take several days to resolve.  °6. It is common to experience some constipation if taking pain medication after surgery.  Increasing fluid intake and taking a stool softener will usually help or prevent this problem from occurring.  A mild laxative (Milk of Magnesia or Miralax) should be taken according to package directions if there are no bowel movements after 48 hours. °7. Unless discharge instructions indicate otherwise, you may remove your bandages 48 hours after surgery, and you may shower at that time.  You may have steri-strips (small skin tapes) in place directly over the incision.  These strips should be left on the skin for 7-10 days.   Any sutures or staples will be removed at the office during your follow-up visit. °8. ACTIVITIES:  You may resume  regular daily activities (gradually increasing) beginning the next day.  Wearing a good support bra or sports bra (or the breast binder) minimizes pain and swelling.  You may have sexual intercourse when it is comfortable. °a. You may drive when you no longer are taking prescription pain medication, you can comfortably wear a seatbelt, and you can safely maneuver your car and apply brakes. °b. RETURN TO WORK:  __________1 week_______________ °9. You should see your doctor in the office for a follow-up appointment approximately two weeks after your surgery.  Your doctor’s nurse will typically make your follow-up appointment when she calls you with your pathology report.  Expect your pathology report 2-3 business days after your surgery.  You may call to check if you do not hear from us after three days. ° ° °WHEN TO CALL YOUR DOCTOR: °1. Fever over 101.0 °2. Nausea and/or vomiting. °3. Extreme swelling or bruising. °4. Continued bleeding from incision. °5. Increased pain, redness, or drainage from the incision. ° °The clinic staff is available to answer your questions during regular business hours.  Please don’t hesitate to call and ask to speak to one of the nurses for clinical concerns.  If you have a medical emergency, go to the nearest emergency room or call 911.  A surgeon from [facility] is always on call at the hospital. ° °For further questions, please visit [email or web address]  ° ° °Post Anesthesia Home Care Instructions ° °Activity: °Get plenty of rest for the remainder of the day. A responsible adult should stay with you for 24   hours following the procedure.  °For the next 24 hours, DO NOT: °-Drive a car °-Operate machinery °-Drink alcoholic beverages °-Take any medication unless instructed by your physician °-Make any legal decisions or sign important papers. ° °Meals: °Start with liquid foods such as gelatin or soup. Progress to regular foods as tolerated. Avoid greasy, spicy, heavy  foods. If nausea and/or vomiting occur, drink only clear liquids until the nausea and/or vomiting subsides. Call your physician if vomiting continues. ° °Special Instructions/Symptoms: °Your throat may feel dry or sore from the anesthesia or the breathing tube placed in your throat during surgery. If this causes discomfort, gargle with warm salt water. The discomfort should disappear within 24 hours. ° °If you had a scopolamine patch placed behind your ear for the management of post- operative nausea and/or vomiting: ° °1. The medication in the patch is effective for 72 hours, after which it should be removed.  Wrap patch in a tissue and discard in the trash. Wash hands thoroughly with soap and water. °2. You may remove the patch earlier than 72 hours if you experience unpleasant side effects which may include dry mouth, dizziness or visual disturbances. °3. Avoid touching the patch. Wash your hands with soap and water after contact with the patch. °  ° °

## 2014-12-27 NOTE — Progress Notes (Signed)
Assisted Dr. Veatrice Kells with right, ultrasound guided, pectoralis block. Side rails up, monitors on throughout procedure. See vital signs in flow sheet. Tolerated Procedure well.

## 2014-12-27 NOTE — H&P (Signed)
Darlene [name] Location: [facility] Patient #: [id number] DOB: 1950-06-05 Married / Language: English / Race: White Female   History of Present Illness  The patient is a 64 year old female who presents with breast cancer. Prior history [place] is a 64 yo F referred by Dr. Thressa Sheller for consultation regarding a new right breast cancer. The patient presented with a palpable mass on the right. She underwent diagnostic imaging and was found to have a 1.7 cm mass at 10 o'clock and a axillary lymph node with loss of fatty hilum. She subsequently underwent core needle biopsy of the breast mass and lymph node. Both were positive for grade 2-3 invasive ductal carcinoma (breast wtih DCIS, mammary type), ER/PR negative, her 2 overexpressed. Ki 67 was 90%. Patient had a paternal grandmother with breast cancer at age 20. Also, her nephew had total colectomy for cancer and "many many polyps." She is nulliparous. She had menarche at age 54. She had menopause at age 80. She did use hormonal contraception for around 6 years, but has not had HRT. She is up to date with her colonoscopy and pap smear. ]  Patient has just finished chemotherapy. She can no longer palpate the mass in her right breast. She got her MRI 11/1 and had a complete radiologic response. She is starting to feel better after chemo.   IMPRESSION: 1. Complete MRI response to neoadjuvant treatment. No residual enhancement in the right breast. 2. No suspicious lymph nodes in the right axilla following neoadjuvant treatment     Medication History  Medications Reconciled    Review of Systems  All other systems negative   Physical Exam  General Mental Status-Alert. General Appearance-Consistent with stated age. Hydration-Well hydrated. Voice-Normal.  Head and Neck Head-normocephalic, atraumatic with no lesions or palpable masses.  Eye Sclera/Conjunctiva - Bilateral-No scleral  icterus.  Chest and Lung Exam Chest and lung exam reveals -quiet, even and easy respiratory effort with no use of accessory muscles. Inspection Chest Wall - Normal. Back - normal.  Breast Note: No palpable breast or axillary mass on right.   Cardiovascular Cardiovascular examination reveals -normal pedal pulses bilaterally. Note: regular rate and rhythm  Abdomen Inspection-Inspection Normal. Palpation/Percussion Palpation and Percussion of the abdomen reveal - Soft, Non Tender, No Rebound tenderness, No Rigidity (guarding) and No hepatosplenomegaly.  Peripheral Vascular Upper Extremity Inspection - Bilateral - Normal - No Clubbing, No Cyanosis, No Edema, Pulses Intact. Lower Extremity Palpation - Edema - Bilateral - No edema.  Neurologic Neurologic evaluation reveals -alert and oriented x 3 with no impairment of recent or remote memory. Mental Status-Normal.  Musculoskeletal Global Assessment -Note: no gross deformities.  Normal Exam - Left-Upper Extremity Strength Normal and Lower Extremity Strength Normal. Normal Exam - Right-Upper Extremity Strength Normal and Lower Extremity Strength Normal.  Lymphatic Head & Neck  General Head & Neck Lymphatics: Bilateral - Description - Normal. Axillary  General Axillary Region: Bilateral - Description - Normal. Tenderness - Non Tender.    Assessment & Plan PRIMARY CANCER OF UPPER OUTER QUADRANT OF RIGHT FEMALE BREAST (C50.411) Impression: Patient has a radiologic complete response. I have spoken with Dr. Marcelo Baldy about the patient. We will proceed with seed localized lumpectomy and seed targeted SLN bx. She will have oncoplastic surgery wtih Dr. Iran Planas around 1 week afterward in case we have to do additional margins.  The surgical procedure was described to the patient. I discussed the incision type and location and that we would need radiology involved on  with a wire or seed marker and/or sentinel  node.  The risks and benefits of the procedure were described to the patient and she wishes to proceed.  We discussed the risks bleeding, infection, damage to other structures, need for further procedures/surgeries. We discussed the risk of seroma. The patient was advised if the area in the breast in cancer, we may need to go back to surgery for additional tissue to obtain negative margins or for a lymph node biopsy. The patient was advised that these are the most common complications, but that others can occur as well. They were advised against taking aspirin or other anti-inflammatory agents/blood thinners the week before surgery. Current Plans Pt Education - flb breast cancer surgery: discussed with patient and provided information. You are being scheduled for surgery - Our schedulers will call you.  You should hear from our office's scheduling department within 5 working days about the location, date, and time of surgery. We try to make accommodations for patient's preferences in scheduling surgery, but sometimes the OR schedule or the surgeon's schedule prevents [name] from making those accommodations.  If you have not heard from our office [phone]) in 5 working days, call the office and ask for your surgeon's nurse.  If you have other questions about your diagnosis, plan, or surgery, call the office and ask for your surgeon's nurse.    Signed by Stark Klein, MD

## 2014-12-27 NOTE — Op Note (Addendum)
Right Breast Radioactive seed localized lumpectomy and right seed localizedsentinel lymph node biopsy  Indications: This patient presents with history of right breast cancer, s/p neoadjuvant chemotherapy  Pre-operative Diagnosis: right breast cancer, [id number]  Post-operative Diagnosis: Same  Surgeon: Stark Klein   Anesthesia: General endotracheal anesthesia  ASA Class: 3  Procedure Details  The patient was seen in the Holding Room. The risks, benefits, complications, treatment options, and expected outcomes were discussed with the patient. The possibilities of bleeding, infection, the need for additional procedures, failure to diagnose a condition, and creating a complication requiring transfusion or operation were discussed with the patient. The patient concurred with the proposed plan, giving informed consent.  The site of surgery properly noted/marked. The patient was taken to Operating Room # 8, identified, and the procedure verified as Right Breast Seed localized Lumpectomy with sentinel lymph node biopsy. A Time Out was held and the above information confirmed.  The right arm, breast, and chest were prepped and draped in standard fashion. The lumpectomy was performed by creating an transverse incision over the right upper quadrant of the breast over the previously placed radioactive seed.  This was in a incision line that Dr. Iran Planas drew.  Dissection was carried down to around the point of maximum signal intensity. The cautery was used to perform the dissection.  Hemostasis was achieved with cautery. The edges of the cavity were marked with large clips, with one each medial, lateral, inferior and superior, and two clips posteriorly.   The specimen was inked with the margin marker paint kit.    Specimen radiography confirmed inclusion of the mammographic lesion, the clip, and the seed.  The background signal in the breast was zero.  The breast was irrigated and hemostasis was achieved with  cautery.  The wound was irrigated and closed with 3-0 vicryl in layers and 4-0 monocryl subcuticular suture.  A small defect was made in the skin with the cautery.    Using a hand-held gamma probe, right axillary sentinel nodes were identified transcutaneously.  An oblique incision was created below the axillary hairline.  Dissection was carried through the clavipectoral fascia.  Three level 2 axillary sentinel nodes were removed.  Lymph node number 1 was the one with the seed and the hydromark.  Lymph node number 2 was hot with cps 30 The background count was 2 cps. Lymph node #3 was palpable. The wound was irrigated.  Hemostasis was achieved with cautery.  The axillary incision was closed with a 3-0 vicryl deep dermal interrupted sutures and a 4-0 monocryl subcuticular closure.    Sterile dressings were applied. At the end of the operation, all sponge, instrument, and needle counts were correct.  Findings: grossly clear surgical margins and no adenopathy.  Lateral, anterior and superior breast margin is skin.  Posterior margin is pectoralis.    Estimated Blood Loss:  min         Specimens: right breast lumpectomy and 3 axillary sentinel lymph nodes.             Complications:  None; patient tolerated the procedure well.         Disposition: PACU - hemodynamically stable.         Condition: stable

## 2014-12-27 NOTE — Anesthesia Preprocedure Evaluation (Addendum)
Anesthesia Evaluation  Patient identified by MRN, date of birth, ID band Patient awake    Reviewed: Allergy & Precautions, NPO status , Patient's Chart, lab work & pertinent test results  Airway Mallampati: I  TM Distance: >3 FB Neck ROM: Full    Dental  (+) Teeth Intact, Dental Advisory Given   Pulmonary sleep apnea and Continuous Positive Airway Pressure Ventilation ,    breath sounds clear to auscultation       Cardiovascular hypertension, Pt. on medications  Rhythm:Regular Rate:Normal     Neuro/Psych Anxiety Depression  Neuromuscular disease    GI/Hepatic   Endo/Other  diabetes, Well Controlled, Type 2, Insulin Dependentobesity  Renal/GU      Musculoskeletal   Abdominal   Peds  Hematology   Anesthesia Other Findings NPO appropriate, allergies reviewed Denies active cardiac or pulmonary symptoms, METS > 4 No recent congestive cough or symptoms of upper respiratory infection    Reproductive/Obstetrics                            Anesthesia Physical  Anesthesia Plan  ASA: III  Anesthesia Plan: General and Regional   Post-op Pain Management:    Induction: Intravenous  Airway Management Planned: LMA  Additional Equipment:   Intra-op Plan:   Post-operative Plan: Extubation in OR  Informed Consent: I have reviewed the patients History and Physical, chart, labs and discussed the procedure including the risks, benefits and alternatives for the proposed anesthesia with the patient or authorized representative who has indicated his/her understanding and acceptance.   Dental advisory given  Plan Discussed with: CRNA, Anesthesiologist and Surgeon  Anesthesia Plan Comments: (PECS block)        Anesthesia Quick Evaluation

## 2014-12-27 NOTE — Progress Notes (Signed)
Emotional support during breast injections °

## 2014-12-27 NOTE — Anesthesia Postprocedure Evaluation (Signed)
Anesthesia Post Note  Patient: ASHI KRITZ  Procedure(s) Performed: Procedure(s) (LRB): BREAST LUMPECTOMY WITH RADIOACTIVE SEED AND SENTINEL LYMPH NODE BIOPSY (Right)  Patient location during evaluation: PACU Anesthesia Type: General and Regional Level of consciousness: awake and alert Pain management: satisfactory to patient Vital Signs Assessment: post-procedure vital signs reviewed and stable Respiratory status: spontaneous breathing Cardiovascular status: blood pressure returned to baseline Anesthetic complications: no    Last Vitals:  Filed Vitals:   12/27/14 1400 12/27/14 1415  BP: 161/63 168/71  Pulse: 93 96  Temp:    Resp: 13 15    Last Pain:  Filed Vitals:   12/27/14 1419  PainSc: 2                  Tiajuana Amass

## 2014-12-27 NOTE — Transfer of Care (Signed)
Immediate Anesthesia Transfer of Care Note  Patient: Darlene [name]  Procedure(s) Performed: Procedure(s): BREAST LUMPECTOMY WITH RADIOACTIVE SEED AND SENTINEL LYMPH NODE BIOPSY (Right)  Patient Location: PACU  Anesthesia Type:GA combined with regional for post-op pain  Level of Consciousness: awake, alert  and oriented  Airway & Oxygen Therapy: Patient Spontanous Breathing and Patient connected to face mask oxygen  Post-op Assessment: Report given to RN and Post -op Vital signs reviewed and stable  Post vital signs: Reviewed and stable  Last Vitals:  Filed Vitals:   12/27/14 1009 12/27/14 1010  BP:    Pulse: 77 75  Temp:    Resp: 18 14    Complications: No apparent anesthesia complications

## 2014-12-27 NOTE — Anesthesia Procedure Notes (Addendum)
Anesthesia Regional Block:  Pectoralis block  Pre-Anesthetic Checklist: ,, timeout performed, Correct Patient, Correct Site, Correct Laterality, Correct Procedure, Correct Position, site marked, Risks and benefits discussed,  Surgical consent,  Pre-op evaluation,  At surgeon's request and post-op pain management  Laterality: Right  Prep: chloraprep       Needles:  Injection technique: Single-shot  Needle Type: Echogenic Stimulator Needle     Needle Length: 4cm 4 cm Needle Gauge: 21 and 21 G    Additional Needles:  Procedures: ultrasound guided (picture in chart) Pectoralis block Narrative:  Injection made incrementally with aspirations every 5 mL.  Performed by: Personally  Anesthesiologist: JUDD, BENJAMIN  Additional Notes: Risks, benefits and alternative to block explained extensively.  Patient tolerated procedure well, without complications.   Procedure Name: LMA Insertion Date/Time: 12/27/2014 10:38 AM Performed by: Melynda Ripple D Pre-anesthesia Checklist: Patient identified, Emergency Drugs available, Suction available and Patient being monitored Patient Re-evaluated:Patient Re-evaluated prior to inductionOxygen Delivery Method: Circle System Utilized Preoxygenation: Pre-oxygenation with 100% oxygen Intubation Type: IV induction Ventilation: Mask ventilation without difficulty LMA: LMA inserted LMA Size: 4.0 Number of attempts: 1 Airway Equipment and Method: Bite block Placement Confirmation: positive ETCO2 Tube secured with: Tape Dental Injury: Teeth and Oropharynx as per pre-operative assessment

## 2014-12-28 ENCOUNTER — Other Ambulatory Visit (HOSPITAL_COMMUNITY): Payer: BLUE CROSS/BLUE SHIELD

## 2014-12-28 ENCOUNTER — Encounter (HOSPITAL_BASED_OUTPATIENT_CLINIC_OR_DEPARTMENT_OTHER): Payer: Self-pay | Admitting: General Surgery

## 2014-12-30 ENCOUNTER — Other Ambulatory Visit: Payer: Self-pay | Admitting: *Deleted

## 2015-01-02 ENCOUNTER — Other Ambulatory Visit (HOSPITAL_BASED_OUTPATIENT_CLINIC_OR_DEPARTMENT_OTHER): Payer: BLUE CROSS/BLUE SHIELD

## 2015-01-02 ENCOUNTER — Ambulatory Visit: Payer: BLUE CROSS/BLUE SHIELD

## 2015-01-02 ENCOUNTER — Encounter: Payer: Self-pay | Admitting: *Deleted

## 2015-01-02 ENCOUNTER — Telehealth: Payer: Self-pay | Admitting: General Surgery

## 2015-01-02 ENCOUNTER — Ambulatory Visit (HOSPITAL_BASED_OUTPATIENT_CLINIC_OR_DEPARTMENT_OTHER): Payer: BLUE CROSS/BLUE SHIELD | Admitting: Nurse Practitioner

## 2015-01-02 ENCOUNTER — Encounter: Payer: Self-pay | Admitting: Nurse Practitioner

## 2015-01-02 ENCOUNTER — Other Ambulatory Visit: Payer: BLUE CROSS/BLUE SHIELD

## 2015-01-02 ENCOUNTER — Other Ambulatory Visit: Payer: Self-pay | Admitting: *Deleted

## 2015-01-02 ENCOUNTER — Ambulatory Visit (HOSPITAL_BASED_OUTPATIENT_CLINIC_OR_DEPARTMENT_OTHER): Payer: BLUE CROSS/BLUE SHIELD

## 2015-01-02 VITALS — BP 143/64 | HR 83 | Temp 98.1°F | Resp 17 | Ht 69.0 in | Wt 205.9 lb

## 2015-01-02 DIAGNOSIS — C50411 Malignant neoplasm of upper-outer quadrant of right female breast: Secondary | ICD-10-CM

## 2015-01-02 DIAGNOSIS — Z5112 Encounter for antineoplastic immunotherapy: Secondary | ICD-10-CM

## 2015-01-02 DIAGNOSIS — R110 Nausea: Secondary | ICD-10-CM | POA: Diagnosis not present

## 2015-01-02 DIAGNOSIS — C773 Secondary and unspecified malignant neoplasm of axilla and upper limb lymph nodes: Secondary | ICD-10-CM

## 2015-01-02 DIAGNOSIS — Z171 Estrogen receptor negative status [ER-]: Secondary | ICD-10-CM

## 2015-01-02 DIAGNOSIS — R112 Nausea with vomiting, unspecified: Secondary | ICD-10-CM

## 2015-01-02 LAB — COMPREHENSIVE METABOLIC PANEL
ALT: 17 U/L (ref 0–55)
AST: 19 U/L (ref 5–34)
Albumin: 3.1 g/dL — ABNORMAL LOW (ref 3.5–5.0)
Alkaline Phosphatase: 77 U/L (ref 40–150)
Anion Gap: 9 mEq/L (ref 3–11)
BUN: 23.5 mg/dL (ref 7.0–26.0)
CO2: 25 mEq/L (ref 22–29)
Calcium: 9.1 mg/dL (ref 8.4–10.4)
Chloride: 105 mEq/L (ref 98–109)
Creatinine: 1.3 mg/dL — ABNORMAL HIGH (ref 0.6–1.1)
EGFR: 43 mL/min/{1.73_m2} — ABNORMAL LOW (ref >90)
Glucose: 192 mg/dl — ABNORMAL HIGH (ref 70–140)
Potassium: 3.9 mEq/L (ref 3.5–5.1)
Sodium: 139 mEq/L (ref 136–145)
Total Bilirubin: 0.68 mg/dL (ref 0.20–1.20)
Total Protein: 6.3 g/dL — ABNORMAL LOW (ref 6.4–8.3)

## 2015-01-02 LAB — CBC WITH DIFFERENTIAL/PLATELET
BASO%: 0.5 % (ref 0.0–2.0)
Basophils Absolute: 0 10*3/uL (ref 0.0–0.1)
EOS%: 2 % (ref 0.0–7.0)
Eosinophils Absolute: 0.2 10*3/uL (ref 0.0–0.5)
HCT: 28.4 % — ABNORMAL LOW (ref 34.8–46.6)
HGB: 9.2 g/dL — ABNORMAL LOW (ref 11.6–15.9)
LYMPH%: 20.4 % (ref 14.0–49.7)
MCH: 30.3 pg (ref 25.1–34.0)
MCHC: 32.4 g/dL (ref 31.5–36.0)
MCV: 93.4 fL (ref 79.5–101.0)
MONO#: 0.5 10*3/uL (ref 0.1–0.9)
MONO%: 5.5 % (ref 0.0–14.0)
NEUT#: 6 10*3/uL (ref 1.5–6.5)
NEUT%: 71.6 % (ref 38.4–76.8)
Platelets: 286 10*3/uL (ref 145–400)
RBC: 3.04 10*6/uL — ABNORMAL LOW (ref 3.70–5.45)
RDW: 14.5 % (ref 11.2–14.5)
WBC: 8.3 10*3/uL (ref 3.9–10.3)
lymph#: 1.7 10*3/uL (ref 0.9–3.3)
nRBC: 0 % (ref 0–0)

## 2015-01-02 MED ORDER — TRASTUZUMAB CHEMO INJECTION 440 MG
6.0000 mg/kg | Freq: Once | INTRAVENOUS | Status: AC
  Administered 2015-01-02: 567 mg via INTRAVENOUS
  Filled 2015-01-02: qty 27

## 2015-01-02 MED ORDER — ACETAMINOPHEN 325 MG PO TABS
ORAL_TABLET | ORAL | Status: AC
  Filled 2015-01-02: qty 2

## 2015-01-02 MED ORDER — ONDANSETRON HCL 8 MG PO TABS: 8.0000 mg | ORAL_TABLET | Freq: Three times a day (TID) | ORAL | Status: DC | PRN

## 2015-01-02 MED ORDER — ACETAMINOPHEN 325 MG PO TABS
650.0000 mg | ORAL_TABLET | Freq: Once | ORAL | Status: AC
  Administered 2015-01-02: 650 mg via ORAL

## 2015-01-02 MED ORDER — DIPHENHYDRAMINE HCL 25 MG PO CAPS
ORAL_CAPSULE | ORAL | Status: AC
  Filled 2015-01-02: qty 1

## 2015-01-02 MED ORDER — SODIUM CHLORIDE 0.9 % IV SOLN
Freq: Once | INTRAVENOUS | Status: AC
  Administered 2015-01-02: 12:00:00 via INTRAVENOUS
  Filled 2015-01-02: qty 4

## 2015-01-02 MED ORDER — SODIUM CHLORIDE 0.9 % IV SOLN
Freq: Once | INTRAVENOUS | Status: AC
  Administered 2015-01-02: 12:00:00 via INTRAVENOUS

## 2015-01-02 MED ORDER — SODIUM CHLORIDE 0.9 % IJ SOLN
10.0000 mL | INTRAMUSCULAR | Status: DC | PRN
  Administered 2015-01-02: 10 mL
  Filled 2015-01-02: qty 10

## 2015-01-02 MED ORDER — HEPARIN SOD (PORK) LOCK FLUSH 100 UNIT/ML IV SOLN
500.0000 [IU] | Freq: Once | INTRAVENOUS | Status: AC | PRN
  Administered 2015-01-02: 500 [IU]
  Filled 2015-01-02: qty 5

## 2015-01-02 MED ORDER — DIPHENHYDRAMINE HCL 25 MG PO CAPS
25.0000 mg | ORAL_CAPSULE | Freq: Once | ORAL | Status: AC
  Administered 2015-01-02: 25 mg via ORAL

## 2015-01-02 NOTE — Patient Instructions (Signed)
[facility] Discharge Instructions for Patients Receiving Chemotherapy  Today you received the following chemotherapy agents: Herceptin   To help prevent nausea and vomiting after your treatment, we encourage you to take your nausea medication as directed.    If you develop nausea and vomiting that is not controlled by your nausea medication, call the clinic.   BELOW ARE SYMPTOMS THAT SHOULD BE REPORTED IMMEDIATELY:  *FEVER GREATER THAN 100.5 F  *CHILLS WITH OR WITHOUT FEVER  NAUSEA AND VOMITING THAT IS NOT CONTROLLED WITH YOUR NAUSEA MEDICATION  *UNUSUAL SHORTNESS OF BREATH  *UNUSUAL BRUISING OR BLEEDING  TENDERNESS IN MOUTH AND THROAT WITH OR WITHOUT PRESENCE OF ULCERS  *URINARY PROBLEMS  *BOWEL PROBLEMS  UNUSUAL RASH Items with * indicate a potential emergency and should be followed up as soon as possible.  Feel free to call the clinic you have any questions or concerns. The clinic phone number is [phone].  Please show the CHEMO ALERT CARD at check-in to the Emergency Department and triage nurse.   

## 2015-01-02 NOTE — Pre-Procedure Instructions (Signed)
Darlene [name]  01/02/2015      [facility] - [place], [address] [place] [address] Phone: [phone] Fax: [phone]    Your procedure is scheduled on Dec. 16  Report to [place] at [phone].  Call this number if you have problems the morning of surgery:  [phone]   Remember:  Do not eat food or drink liquids after midnight.  Take these medicines the morning of surgery with A SIP OF WATER tylenol if needed,  Mometasone (Nasonex) spray if needed, Ondansetron (Zofran) if needed, vortioxetine HBr (Brintellix)  Stop taking Aspirin, Ibuprofen, Advil, motrin, Aleve, BC's, Goody's, Herbal medications, Fish oil How to Manage Your Diabetes Before Surgery   Why is it important to control my blood sugar before and after surgery?   Improving blood sugar levels before and after surgery helps healing and can limit problems.  A way of improving blood sugar control is eating a healthy diet by:  - Eating less sugar and carbohydrates  - Increasing activity/exercise  - Talk with your doctor about reaching your blood sugar goals  High blood sugars (greater than 180 mg/dL) can raise your risk of infections and slow down your recovery so you will need to focus on controlling your diabetes during the weeks before surgery.  Make sure that the doctor who takes care of your diabetes knows about your planned surgery including the date and location.  How do I manage my blood sugars before surgery?   Check your blood sugar at least 4 times a day, 2 days before surgery to make sure that they are not too high or low.   Check your blood sugar the morning of your surgery when you wake up and every 2               hours until you get to the Short-Stay unit.  If your blood sugar is less than 70 mg/dL, you will need to treat for low blood sugar by:  Treat a low blood sugar (less than 70 mg/dL) with 1/2  cup of clear juice (cranberry or apple), 4 glucose tablets, OR glucose gel.  Recheck blood sugar in 15 minutes after treatment (to make sure it is greater than 70 mg/dL).  If blood sugar is not greater than 70 mg/dL on re-check, call [phone] for further instructions.   Report your blood sugar to the Short-Stay nurse when you get to Short-Stay.  References:  [facility], 2007 "How to Manage your Diabetes Before and After Surgery".  What do I do about my diabetes medications?   Do not take oral diabetes medicines (pills) the morning of surgery.  THE NIGHT BEFORE SURGERY, take 7 units of Novolin 70/30 Insulin.   Do not take other diabetes injectables the day of surgery including Byetta, Victoza, Bydureon, and Trulicity.    If your CBG is greater than 220 mg/dL, you may take 1/2 of your sliding scale (correction) dose of insulin.   For patients with "Insulin Pumps":  Contact your diabetes doctor for specific instructions before surgery.   Decrease basal insulin rates by 20% at midnight the night before surgery.  Note that if your surgery is planned to be longer than 2 hours, your insulin pump will be removed and intravenous (IV) insulin will be started and managed by the nurses and anesthesiologist.  You will be able to restart your insulin pump once you are  awake and able to manage it.  Make sure to bring insulin pump supplies to the hospital with you in case your site needs to be changed.        Do not wear jewelry, make-up or nail polish.  Do not wear lotions, powders, or perfumes.  You may wear deodorant.  Do not shave 48 hours prior to surgery.  Men may shave face and neck.  Do not bring valuables to the hospital.  [facility] is not responsible for any belongings or valuables.  Contacts, dentures or bridgework may not be worn into surgery.  Leave your suitcase in the car.  After surgery it may be brought to your room.  For patients  admitted to the hospital, discharge time will be determined by your treatment team.  Patients discharged the day of surgery will not be allowed to drive home.    Special instructions:  [facility] - Preparing for Surgery  Before surgery, you can play an important role.  Because skin is not sterile, your skin needs to be as free of germs as possible.  You can reduce the number of germs on you skin by washing with CHG (chlorahexidine gluconate) soap before surgery.  CHG is an antiseptic cleaner which kills germs and bonds with the skin to continue killing germs even after washing.  Please DO NOT use if you have an allergy to CHG or antibacterial soaps.  If your skin becomes reddened/irritated stop using the CHG and inform your nurse when you arrive at Short Stay.  Do not shave (including legs and underarms) for at least 48 hours prior to the first CHG shower.  You may shave your face.  Please follow these instructions carefully:   1.  Shower with CHG Soap the night before surgery and the     morning of Surgery.  2.  If you choose to wash your hair, wash your hair first as usual with your   normal shampoo.  3.  After you shampoo, rinse your hair and body thoroughly to remove the Shampoo.  4.  Use CHG as you would any other liquid soap.  You can apply chg directly  to the skin and wash gently with scrungie or a clean washcloth.  5.  Apply the CHG Soap to your body ONLY FROM THE NECK DOWN.    Do not use on open wounds or open sores.  Avoid contact with your eyes, ears, mouth and genitals (private parts).  Wash genitals (private parts)       with your normal soap.  6.  Wash thoroughly, paying special attention to the area where your surgery   will be performed.  7.  Thoroughly rinse your body with warm water from the neck down.  8.  DO NOT shower/wash with your normal soap after using and rinsing off  the CHG Soap.  9.  Pat yourself dry with a clean towel.            10.  Wear clean pajamas.             11.  Place clean sheets on your bed the night of your first shower and do not  sleep with pets.  Day of Surgery  Do not apply any lotions/deoderants the morning of surgery.  Please wear clean clothes to the hospital/surgery center.     Please read over the following fact sheets that you were given. Pain Booklet, Coughing and Deep Breathing and Surgical Site Infection Prevention

## 2015-01-02 NOTE — Progress Notes (Signed)
[place]  [phone] [phone]     ID: Darlene [name] DOB: 01-31-1950  MR#: [id number]  YBW#:[id number]  Patient Care Team: Thressa Sheller, MD as PCP - General (Internal Medicine) Bryson Ha Himmelrich, RD as Dietitian (Bariatrics) Stark Klein, MD as Consulting Physician (General Surgery) Chauncey Cruel, MD as Consulting Physician (Oncology) Arloa Koh, MD as Consulting Physician (Radiation Oncology) Mauro Kaufmann, RN as Registered Nurse Rockwell Germany, RN as Registered Nurse Jacelyn Pi, MD as Consulting Physician (Endocrinology) PCP: Thressa Sheller, MD GYN: Kem Boroughs FNP OTHER MD:  CHIEF COMPLAINT:  HER-2 positive , estrogen receptor negative breast cancer  CURRENT TREATMENT:  anti-HER-2 treatment; awaiting definitive surgery  BREAST CANCER HISTORY: From the original intake note:  "Darlene [name]" had screening mammography at her gynecologist suggestive of a possible abnormality in the right breast. However right diagnostic mammogram 04/12/2013 at [facility] was negative. More recently however, she noted a lump in her right breast and brought it to her gynecologist's attention. On 07/18/2014 the patient had bilateral diagnostic mammography with tomosynthesis at [facility]. The breast density was category B. In the right breast upper outer quadrant there was an area of focal asymmetry with indistinct margins.[name] Ultrasound was obtained and showed in addition to the mass in question, measuring 1.7 cm, an abnormal-appearing lymph node in the right axillary tail.  On 07/19/2014 the patient underwent biopsy of the right breast massin question as well as a right axillary lymph node. Both were positive for invasive ductal carcinoma, grade 2, estrogen and progesterone receptor negative, with an MIB-1 of 90%, and HER-2 pending. Incidentally the lymph node biopsy showed a lymphocytic inflammatory component but no lymph node architecture.  Her subsequent  history is as detailed below.  INTERVAL HISTORY:  Darlene [name] returns today for follow-up of her breast cancer, accompanied by her husband Darlene [name]. Today she is due for her next cycle of trastuzumab. The interval history is remarkable for a 3 day hospital admission for pneumonia after Thanksgiving. A few days later she had her right lumpectomy. She is healing well from this with minimal pain. She has had to change the dressing to this site several times however due to weeping from the outer quadrant incision line. She has not had her follow up appointment with Dr. Barry Dienes yet.   REVIEW OF SYSTEMS: Darlene [name] denies fevers or chills, but she still has a slight cough from the pneumonia. She has experienced waves of nausea and occasional vomiting for the past few weeks. She ran out of zofran and compazine was ineffective (and may have caused more nausea). She has loose bowel movements every other day, but denies diarrhea or need for antidiarrheal agents. Her appetite is fair. She is staying well hydrated. The residual neuropathy to her toes is stable. She has no swelling to her arms or legs. Her energy level is decent. She endorses depression but denies anxiety. A detailed review of system sis otherwise stable.  PAST MEDICAL HISTORY: Past Medical History  Diagnosis Date  . Obesity   . Hypertension   . Hyperlipidemia   . Diabetes mellitus without complication ([place])     76+ years  . Eating disorder     binge eating  . Balance problem   . Arthritis     in fingers, shoulders  . Anemia     hx of  . Depression   . Ovarian cyst rupture     possible  . Clumsiness   . Breast cancer of upper-outer quadrant of right female  breast ([place]) 07/22/2014  . Breast cancer ([place])   . Anxiety   . Pneumonia   . Neuromuscular disorder ([place])     diabetic neuropathy  . Obstructive sleep apnea on CPAP     uses cpap nightly    PAST SURGICAL HISTORY: Past Surgical History  Procedure Laterality Date  . Carpal tunnel release  Right   . Carpal tunnel release Left   . Breath tek h pylori N/A 09/15/2012    Procedure: BREATH TEK H PYLORI;  Surgeon: Pedro Earls, MD;  Location: [facility];  Service: General;  Laterality: N/A;  . Dupuytren contracture release    . Trigger finger release      x3  . Toe surgery  1970s    bone spur  . Rotator cuff repair Right 2005  . Gastric roux-en-y N/A 11/02/2012    Procedure: LAPAROSCOPIC ROUX-EN-Y GASTRIC;  Surgeon: Pedro Earls, MD;  Location: [facility];  Service: General;  Laterality: N/A;  . Bunionectomy Left 12/2013  . Portacath placement Left 08/02/2014    Procedure: INSERTION PORT-A-CATH;  Surgeon: Stark Klein, MD;  Location: [place];  Service: General;  Laterality: Left;  . Breast lumpectomy with radioactive seed and sentinel lymph node biopsy Right 12/27/2014    Procedure: BREAST LUMPECTOMY WITH RADIOACTIVE SEED AND SENTINEL LYMPH NODE BIOPSY;  Surgeon: Stark Klein, MD;  Location: [place];  Service: General;  Laterality: Right;    FAMILY HISTORY Family History  Problem Relation Age of Onset  . CVA Mother   . Diabetes Father   . Heart failure Father   . Hypertension Sister   . Diabetes Brother   . Diabetes Paternal Uncle    the patient's father died from complications of diabetes at age 22. The patient's mother died at age 64 with a subarachnoid hemorrhage. The patient had one brother, one sister. The only breast cancer was the patient's paternal grandmother diagnosed in her 41s. There is no history of ovarian cancer in the family.  GYNECOLOGIC HISTORY:  Patient's last menstrual period was 10/22/2007.  menarche age 62, the patient is GX P0. She went through the change of life in 2011. She did not take hormone replacement.  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 Her husband Darlene [name] is a retired Visual merchandiser (he taught at [employer]).  Darlene [name] has 2  children from an earlier marriage, Rockey Situ who teaches in [place], and Hazell Siwik,  who works at the [employer] in in [place]. He has 1 grand son, aged 23. The patient and her husband are not church attender's    ADVANCED DIRECTIVES:  Not in place   HEALTH MAINTENANCE: Social History  Substance Use Topics  . Smoking status: Never Smoker   . Smokeless tobacco: Never Used  . Alcohol Use: 0.0 oz/week    0 Standard drinks or equivalent per week     Comment: social     Colonoscopy:  May 2016  PAP:  Bone density: 07/05/2013 normal, with a T score of 0.4  Lipid panel:  Allergies  Allergen Reactions  . Other Itching    Peanuts in large quantities   . Tape Hives and Rash    Current Outpatient Prescriptions  Medication Sig Dispense Refill  . acetaminophen (TYLENOL) 325 MG tablet Take 650 mg by mouth 2 (two) times daily as needed for moderate pain or headache.    . Calcium Citrate-Vitamin D (CALCIUM  CITRATE+D3 PO) Take 1,800 mg by mouth daily.     . diphenhydrAMINE (BENADRYL) 25 MG tablet Take 25 mg by mouth daily as needed for allergies or sleep.    [name] glucose blood (ONETOUCH VERIO) test strip 1 each by Other route as needed for other. Use as instructed    . losartan (COZAAR) 100 MG tablet Take 1 tablet by mouth daily.     . Melatonin 5 MG TABS Take 5 mg by mouth at bedtime as needed (sleep).    . Multiple Vitamin (MULTIVITAMIN WITH MINERALS) TABS tablet Take 1 tablet by mouth 2 (two) times daily. With iron    . NOVOLIN 70/30 (70-30) 100 UNIT/ML injection 10 units two times a day    . Probiotic Product ([employer] PROBIOTIC PO) Take 1 capsule by mouth daily.    [name] tobramycin-dexamethasone (TOBRADEX) ophthalmic solution Place 1 drop into both eyes 2 (two) times daily. 5 mL 1  . Trastuzumab (HERCEPTIN IV) Inject into the vein. Infusion at [facility]    . Vortioxetine HBr (BRINTELLIX) 20 MG TABS Take 20 mg by mouth daily. 90 tablet 0  . cholestyramine (QUESTRAN) 4 G packet Take 1 packet  (4 g total) by mouth 2 (two) times daily. (Patient not taking: Reported on 01/02/2015) 60 each 1  . clotrimazole (LOTRIMIN) 1 % cream Apply 1 application topically 2 (two) times daily as needed (rash).    . diphenoxylate-atropine (LOMOTIL) 2.5-0.025 MG per tablet Take 1 tablet by mouth 4 (four) times daily as needed for diarrhea or loose stools. (Patient not taking: Reported on 01/02/2015) 60 tablet 1  . mometasone (NASONEX) 50 MCG/ACT nasal spray Place 2 sprays into the nose daily as needed (allergies).     . ondansetron (ZOFRAN) 8 MG tablet Take 1 tablet (8 mg total) by mouth every 8 (eight) hours as needed for nausea or vomiting. 20 tablet 1  . oxyCODONE-acetaminophen (ROXICET) 5-325 MG tablet Take 1-2 tablets by mouth every 4 (four) hours as needed for severe pain. (Patient not taking: Reported on 01/02/2015) 15 tablet 0  . prochlorperazine (COMPAZINE) 10 MG tablet Take 1 tablet (10 mg total) by mouth every 6 (six) hours as needed for nausea or vomiting. (Patient not taking: Reported on 01/02/2015) 30 tablet 1   No current facility-administered medications for this visit.    OBJECTIVE:  Middle-aged white woman in no acute distress Filed Vitals:   01/02/15 1015  BP: 143/64  Pulse: 83  Temp: 98.1 F (36.7 C)  Resp: 17     Body mass index is 30.39 kg/(m^2).    ECOG FS:1 - Symptomatic but completely ambulatory  Skin: warm, dry  HEENT: sclerae anicteric, conjunctivae pink, oropharynx clear. No thrush or mucositis.  Lymph Nodes: No cervical or supraclavicular lymphadenopathy  Lungs: clear to auscultation bilaterally, no rales, wheezes, or rhonci  Heart: regular rate and rhythm  Abdomen: round, soft, non tender, positive bowel sounds  Musculoskeletal: No focal spinal tenderness, no peripheral edema  Neuro: non focal, well oriented, positive affect  Breasts: deferred. Right breast bandaged from recent lumpectomy  LAB RESULTS:  CMP     Component Value Date/Time   NA 139 01/02/2015 0959    NA 137 12/19/2014 0501   K 3.9 01/02/2015 0959   K 4.0 12/19/2014 0501   CL 105 12/19/2014 0501   CO2 25 01/02/2015 0959   CO2 26 12/19/2014 0501   GLUCOSE 192* 01/02/2015 0959   GLUCOSE 201* 12/19/2014 0501   BUN 23.5 01/02/2015 0959   BUN  18 12/19/2014 0501   CREATININE 1.3* 01/02/2015 0959   CREATININE 1.09* 12/19/2014 0501   CREATININE 1.10 02/18/2013 0827   CALCIUM 9.1 01/02/2015 0959   CALCIUM 8.5* 12/19/2014 0501   PROT 6.3* 01/02/2015 0959   PROT 6.0* 12/18/2014 1540   ALBUMIN 3.1* 01/02/2015 0959   ALBUMIN 3.6 12/18/2014 1540   AST 19 01/02/2015 0959   AST 21 12/18/2014 1540   ALT 17 01/02/2015 0959   ALT 22 12/18/2014 1540   ALKPHOS 77 01/02/2015 0959   ALKPHOS 73 12/18/2014 1540   BILITOT 0.68 01/02/2015 0959   BILITOT 1.0 12/18/2014 1540   GFRNONAA 53* 12/19/2014 0501   GFRNONAA 55* 02/18/2013 0827   GFRAA >60 12/19/2014 0501   GFRAA 63 02/18/2013 0827    INo results found for: SPEP, UPEP  Lab Results  Component Value Date   WBC 8.3 01/02/2015   NEUTROABS 6.0 01/02/2015   HGB 9.2* 01/02/2015   HCT 28.4* 01/02/2015   MCV 93.4 01/02/2015   PLT 286 01/02/2015      Chemistry      Component Value Date/Time   NA 139 01/02/2015 0959   NA 137 12/19/2014 0501   K 3.9 01/02/2015 0959   K 4.0 12/19/2014 0501   CL 105 12/19/2014 0501   CO2 25 01/02/2015 0959   CO2 26 12/19/2014 0501   BUN 23.5 01/02/2015 0959   BUN 18 12/19/2014 0501   CREATININE 1.3* 01/02/2015 0959   CREATININE 1.09* 12/19/2014 0501   CREATININE 1.10 02/18/2013 0827      Component Value Date/Time   CALCIUM 9.1 01/02/2015 0959   CALCIUM 8.5* 12/19/2014 0501   ALKPHOS 77 01/02/2015 0959   ALKPHOS 73 12/18/2014 1540   AST 19 01/02/2015 0959   AST 21 12/18/2014 1540   ALT 17 01/02/2015 0959   ALT 22 12/18/2014 1540   BILITOT 0.68 01/02/2015 0959   BILITOT 1.0 12/18/2014 1540       No results found for: LABCA2  No components found for: VHQIO962  No results for input(s):  INR in the last 168 hours.  Urinalysis    Component Value Date/Time   COLORURINE YELLOW 12/18/2014 [place] 12/18/2014 1635   LABSPEC 1.023 12/18/2014 1635   PHURINE 5.0 12/18/2014 1635   GLUCOSEU NEGATIVE 12/18/2014 1635   HGBUR NEGATIVE 12/18/2014 1635   BILIRUBINUR NEGATIVE 12/18/2014 1635   BILIRUBINUR neg 06/29/2013 [place] 12/18/2014 1635   PROTEINUR 30* 12/18/2014 1635   PROTEINUR trace 06/29/2013 1017   UROBILINOGEN negative 06/29/2013 1017   NITRITE NEGATIVE 12/18/2014 1635   NITRITE neg 06/29/2013 1017   LEUKOCYTESUR NEGATIVE 12/18/2014 1635    STUDIES: Dg Chest 2 View  12/18/2014  CLINICAL DATA:  Sore throat, nasal congestion EXAM: CHEST  2 VIEW COMPARISON:  08/02/2014 FINDINGS: Cardiomediastinal silhouette is stable. Degenerative changes thoracic spine. Left subclavian Port-A-Cath is unchanged in position. There is streaky left basilar retrocardiac atelectasis or infiltrate. No pulmonary edema. IMPRESSION: Streaky left basilar retrocardiac atelectasis or infiltrate. No pulmonary edema. Electronically Signed   By: Lahoma Crocker M.D.   On: 12/18/2014 15:43   Nm Sentinel Node Inj-no Rpt (breast)  12/27/2014  CLINICAL DATA: right breast cancer Sulfur colloid was injected intradermally by the nuclear medicine technologist for breast cancer sentinel node localization.    ASSESSMENT: 64 y.o. [place] woman status post right breast and right axillary lymph node biopsy 07/19/2014 for a cT2  pN1, stage IIB  invasive ductal carcinoma, grade 2, estrogen  and progesterone receptor negative, with an MIB-1 of 90%, and HER-2 amplified  (1) neoadjuvant chemotherapy started 08/08/2014, consisting of carboplatin, docetaxel, trastuzumab and pertuzumab given every 3 weeks 6, completed 11/21/2014  (a) breast MRI after initial 3 cycles shows a significant response  (b) breast MRI after 6 cycles shows a complete radiologic response   (2) trastuzumab to be  continued to complete one year (through mid-July 2017)  (a) echo 11/08/2014 shows an EF of 66%  (3) right lumpectomy with sentinel lymph node biopsy on 12/27/14. Awaiting breast reduction.   (4) adjuvant radiation to follow surgery   PLAN: Darlene [name] is handling her challenges well. She is going to speak with Dr. Barry Dienes later this afternoon to find out her pathology results and discuss the weeping to her right breast. She is scheduled for a breast reduction with Dr. Iran Planas this Friday. She will have a few weeks of recovery before meeting with Dr. Sondra Come in January to discuss radiation therapy.  I am unsure as to the cause of her nausea. Between the antibiotics for pneumonia to anesthesia with her lumpectomy it could be a range of issues. She will continue on zofran BID for now.   Darlene [name] will continue trastuzumab every 3 weeks. She is due for a repeat echocardiogram in January. She will meet with Dr. Jana Hakim that month to discuss long term follow up as well. She understands and agrees with this plan. She knows the goal of treatment in her case is cure. She has been encouraged to call with any issues that might arise before her next visit here.    Laurie Panda, NP   01/02/2015 11:06 AM

## 2015-01-03 ENCOUNTER — Encounter (HOSPITAL_COMMUNITY): Payer: Self-pay

## 2015-01-03 ENCOUNTER — Encounter (HOSPITAL_COMMUNITY)
Admission: RE | Admit: 2015-01-03 | Discharge: 2015-01-03 | Disposition: A | Discharge status: Home / Self Care | Payer: BLUE CROSS/BLUE SHIELD | Source: Ambulatory Visit | Attending: Plastic Surgery | Admitting: Plastic Surgery

## 2015-01-03 DIAGNOSIS — N651 Disproportion of reconstructed breast: Secondary | ICD-10-CM | POA: Diagnosis not present

## 2015-01-03 DIAGNOSIS — M1990 Unspecified osteoarthritis, unspecified site: Secondary | ICD-10-CM | POA: Diagnosis not present

## 2015-01-03 DIAGNOSIS — E669 Obesity, unspecified: Secondary | ICD-10-CM | POA: Diagnosis not present

## 2015-01-03 DIAGNOSIS — Z171 Estrogen receptor negative status [ER-]: Secondary | ICD-10-CM | POA: Diagnosis not present

## 2015-01-03 DIAGNOSIS — C50411 Malignant neoplasm of upper-outer quadrant of right female breast: Secondary | ICD-10-CM | POA: Diagnosis not present

## 2015-01-03 DIAGNOSIS — G4733 Obstructive sleep apnea (adult) (pediatric): Secondary | ICD-10-CM | POA: Diagnosis not present

## 2015-01-03 DIAGNOSIS — F419 Anxiety disorder, unspecified: Secondary | ICD-10-CM | POA: Diagnosis not present

## 2015-01-03 DIAGNOSIS — F329 Major depressive disorder, single episode, unspecified: Secondary | ICD-10-CM | POA: Diagnosis not present

## 2015-01-03 DIAGNOSIS — R011 Cardiac murmur, unspecified: Secondary | ICD-10-CM | POA: Diagnosis not present

## 2015-01-03 DIAGNOSIS — I10 Essential (primary) hypertension: Secondary | ICD-10-CM | POA: Diagnosis not present

## 2015-01-03 DIAGNOSIS — Z9221 Personal history of antineoplastic chemotherapy: Secondary | ICD-10-CM | POA: Diagnosis not present

## 2015-01-03 DIAGNOSIS — Z794 Long term (current) use of insulin: Secondary | ICD-10-CM | POA: Diagnosis not present

## 2015-01-03 DIAGNOSIS — D649 Anemia, unspecified: Secondary | ICD-10-CM | POA: Diagnosis not present

## 2015-01-03 DIAGNOSIS — Z6830 Body mass index (BMI) 30.0-30.9, adult: Secondary | ICD-10-CM | POA: Diagnosis not present

## 2015-01-03 DIAGNOSIS — E119 Type 2 diabetes mellitus without complications: Secondary | ICD-10-CM | POA: Diagnosis not present

## 2015-01-03 HISTORY — DX: Cardiac murmur, unspecified: R01.1

## 2015-01-03 LAB — BASIC METABOLIC PANEL
Anion gap: 7 (ref 5–15)
BUN: 22 mg/dL — ABNORMAL HIGH (ref 6–20)
CO2: 29 mmol/L (ref 22–32)
Calcium: 8.8 mg/dL — ABNORMAL LOW (ref 8.9–10.3)
Chloride: 104 mmol/L (ref 101–111)
Creatinine, Ser: 1.46 mg/dL — ABNORMAL HIGH (ref 0.44–1.00)
GFR calc Af Amer: 43 mL/min — ABNORMAL LOW (ref >60)
GFR calc non Af Amer: 37 mL/min — ABNORMAL LOW (ref >60)
Glucose, Bld: 160 mg/dL — ABNORMAL HIGH (ref 65–99)
Potassium: 4 mmol/L (ref 3.5–5.1)
Sodium: 140 mmol/L (ref 135–145)

## 2015-01-03 LAB — CBC
HCT: 29.5 % — ABNORMAL LOW (ref 36.0–46.0)
Hemoglobin: 9.2 g/dL — ABNORMAL LOW (ref 12.0–15.0)
MCH: 30 pg (ref 26.0–34.0)
MCHC: 31.2 g/dL (ref 30.0–36.0)
MCV: 96.1 fL (ref 78.0–100.0)
Platelets: 308 10*3/uL (ref 150–400)
RBC: 3.07 MIL/uL — ABNORMAL LOW (ref 3.87–5.11)
RDW: 14.5 % (ref 11.5–15.5)
WBC: 6.5 10*3/uL (ref 4.0–10.5)

## 2015-01-03 LAB — GLUCOSE, CAPILLARY: Glucose-Capillary: 148 mg/dL — ABNORMAL HIGH (ref 65–99)

## 2015-01-03 NOTE — Telephone Encounter (Signed)
Discussed pathology with patient. Advised that we are reviewing margins to determine if she needs additional surgery.

## 2015-01-03 NOTE — Progress Notes (Addendum)
PCP is Thressa Sheller States she doesn't see a cardiologist, but has an echo every 3 months. Echo noted in epic from 11-08-14 Denies ever having a  card cath [facility] she had a stress test many years ago. Sleep study noted in epic. States that it has been years since the complete sleep study was done, but has check ins and now less sleep apnea due to weight loss. Reports her fasting cbg's are around 112.

## 2015-01-04 LAB — HEMOGLOBIN A1C
Hgb A1c MFr Bld: 6.5 % — ABNORMAL HIGH (ref 4.8–5.6)
Mean Plasma Glucose: 140 mg/dL

## 2015-01-04 NOTE — Progress Notes (Signed)
Quick Note:  Please let patient know that margins are negative, we do not need to do reexcision, and that Dr. Iran Planas is planning to proceed. ______

## 2015-01-04 NOTE — Progress Notes (Signed)
Anesthesia Chart Review:  Pt is 64 year old female scheduled for B breast reduction, R oncoplastic reconstruction, L breast reduction for symmetry on 01/06/2015 with Dr. Iran Planas.   PMH includes:  HTN, DM, hyperlipidemia, OSA (on CPAP), heart murmur, anemia, breast cancer. Never smoker. BMI 31. S/p R breast lumpectomy 12/27/14. S/p laparoscopic roux-en-y 11/02/12.   Pt hospitalized 11/27-11/30/16 for HCAP. Tx with levaquin.   Medications include: losartan, novolin  Preoperative labs reviewed.  Glucose 160, hgbA1c 6.5. H/H 9.2/29.5. This is consistent with prior results dating back to 09/2014. Will order T&S for DOS.   Chest x-ray 12/18/14 reviewed. Streaky L basilar retrocardiac atelectasis or infiltrate. No pulmonary edema.   EKG 01/03/15: Sinus bradycardia (54 bpm)  with 1st degree A-V block. Anteroseptal infarct, age undetermined. No significant change since prior tracing 09/16/12 per Dr. Lysbeth Penner interpretation.   Echo 11/08/14:  - Left ventricle: The cavity size was normal. There was moderate concentric hypertrophy. The estimated ejection fraction was 66%. GLPSS is low normal at -17%. Wall motion was normal; there were no regional wall motion abnormalities. Doppler parameters are consistent with abnormal left ventricular relaxation (grade 1 diastolic dysfunction). The E/e&' ratio is between 8-15, suggesting indeterminate LV filling presure. - Aortic valve: Trileaflet. Sclerosis without stenosis. There was trivial regurgitation. - Impressions: Compared to the prior study in 07/2014, there are no significant changes.  If no changes, I anticipate pt can proceed with surgery as scheduled.   Willeen Cass, FNP-BC [facility]/Anesthesiology Phone: [phone] 01/04/2015 12:54 PM

## 2015-01-06 ENCOUNTER — Observation Stay (HOSPITAL_COMMUNITY)
Admission: RE | Admit: 2015-01-06 | Discharge: 2015-01-07 | Disposition: A | Discharge status: Home / Self Care | Payer: BLUE CROSS/BLUE SHIELD | Source: Ambulatory Visit | Attending: Plastic Surgery | Admitting: Plastic Surgery

## 2015-01-06 ENCOUNTER — Ambulatory Visit (HOSPITAL_COMMUNITY): Payer: BLUE CROSS/BLUE SHIELD | Admitting: Emergency Medicine

## 2015-01-06 ENCOUNTER — Ambulatory Visit (HOSPITAL_COMMUNITY): Payer: BLUE CROSS/BLUE SHIELD | Admitting: Anesthesiology

## 2015-01-06 ENCOUNTER — Encounter (HOSPITAL_COMMUNITY)
Admission: RE | Disposition: A | Discharge status: Home / Self Care | Payer: Self-pay | Source: Ambulatory Visit | Attending: Plastic Surgery

## 2015-01-06 ENCOUNTER — Encounter (HOSPITAL_COMMUNITY): Payer: Self-pay | Admitting: *Deleted

## 2015-01-06 DIAGNOSIS — Z794 Long term (current) use of insulin: Secondary | ICD-10-CM | POA: Insufficient documentation

## 2015-01-06 DIAGNOSIS — E119 Type 2 diabetes mellitus without complications: Secondary | ICD-10-CM | POA: Insufficient documentation

## 2015-01-06 DIAGNOSIS — Z171 Estrogen receptor negative status [ER-]: Secondary | ICD-10-CM | POA: Insufficient documentation

## 2015-01-06 DIAGNOSIS — I10 Essential (primary) hypertension: Secondary | ICD-10-CM | POA: Insufficient documentation

## 2015-01-06 DIAGNOSIS — C50411 Malignant neoplasm of upper-outer quadrant of right female breast: Secondary | ICD-10-CM | POA: Insufficient documentation

## 2015-01-06 DIAGNOSIS — G4733 Obstructive sleep apnea (adult) (pediatric): Secondary | ICD-10-CM | POA: Insufficient documentation

## 2015-01-06 DIAGNOSIS — R011 Cardiac murmur, unspecified: Secondary | ICD-10-CM | POA: Insufficient documentation

## 2015-01-06 DIAGNOSIS — N651 Disproportion of reconstructed breast: Secondary | ICD-10-CM | POA: Diagnosis not present

## 2015-01-06 DIAGNOSIS — Z6830 Body mass index (BMI) 30.0-30.9, adult: Secondary | ICD-10-CM | POA: Insufficient documentation

## 2015-01-06 DIAGNOSIS — E669 Obesity, unspecified: Secondary | ICD-10-CM | POA: Insufficient documentation

## 2015-01-06 DIAGNOSIS — N62 Hypertrophy of breast: Secondary | ICD-10-CM | POA: Diagnosis present

## 2015-01-06 DIAGNOSIS — D649 Anemia, unspecified: Secondary | ICD-10-CM | POA: Insufficient documentation

## 2015-01-06 DIAGNOSIS — Z9221 Personal history of antineoplastic chemotherapy: Secondary | ICD-10-CM | POA: Insufficient documentation

## 2015-01-06 DIAGNOSIS — F329 Major depressive disorder, single episode, unspecified: Secondary | ICD-10-CM | POA: Insufficient documentation

## 2015-01-06 DIAGNOSIS — F419 Anxiety disorder, unspecified: Secondary | ICD-10-CM | POA: Insufficient documentation

## 2015-01-06 DIAGNOSIS — M1990 Unspecified osteoarthritis, unspecified site: Secondary | ICD-10-CM | POA: Insufficient documentation

## 2015-01-06 HISTORY — PX: BREAST REDUCTION SURGERY: SHX8

## 2015-01-06 LAB — GLUCOSE, CAPILLARY
Glucose-Capillary: 113 mg/dL — ABNORMAL HIGH (ref 65–99)
Glucose-Capillary: 228 mg/dL — ABNORMAL HIGH (ref 65–99)
Glucose-Capillary: 232 mg/dL — ABNORMAL HIGH (ref 65–99)

## 2015-01-06 LAB — TYPE AND SCREEN
ABO/RH(D): AB POS
Antibody Screen: NEGATIVE

## 2015-01-06 LAB — ABO/RH: ABO/RH(D): AB POS

## 2015-01-06 SURGERY — MAMMOPLASTY, REDUCTION
Anesthesia: General | Site: Breast | Laterality: Bilateral

## 2015-01-06 MED ORDER — VORTIOXETINE HBR 20 MG PO TABS
20.0000 mg | ORAL_TABLET | Freq: Every day | ORAL | Status: DC
  Filled 2015-01-06 (×2): qty 20

## 2015-01-06 MED ORDER — MIDAZOLAM HCL 5 MG/5ML IJ SOLN
INTRAMUSCULAR | Status: DC | PRN
  Administered 2015-01-06: 2 mg via INTRAVENOUS

## 2015-01-06 MED ORDER — LIDOCAINE HCL (CARDIAC) 20 MG/ML IV SOLN
INTRAVENOUS | Status: AC
  Filled 2015-01-06: qty 5

## 2015-01-06 MED ORDER — CEFAZOLIN SODIUM-DEXTROSE 2-3 GM-% IV SOLR
2.0000 g | INTRAVENOUS | Status: AC
  Administered 2015-01-06: 2 g via INTRAVENOUS
  Filled 2015-01-06: qty 50

## 2015-01-06 MED ORDER — FENTANYL CITRATE (PF) 250 MCG/5ML IJ SOLN
INTRAMUSCULAR | Status: AC
  Filled 2015-01-06: qty 5

## 2015-01-06 MED ORDER — EPHEDRINE SULFATE 50 MG/ML IJ SOLN
INTRAMUSCULAR | Status: DC | PRN
  Administered 2015-01-06: 5 mg via INTRAVENOUS
  Administered 2015-01-06: 10 mg via INTRAVENOUS
  Administered 2015-01-06: 5 mg via INTRAVENOUS
  Administered 2015-01-06 (×3): 10 mg via INTRAVENOUS

## 2015-01-06 MED ORDER — 0.9 % SODIUM CHLORIDE (POUR BTL) OPTIME
TOPICAL | Status: DC | PRN
  Administered 2015-01-06: 2000 mL

## 2015-01-06 MED ORDER — ONDANSETRON HCL 4 MG/2ML IJ SOLN
INTRAMUSCULAR | Status: AC
  Filled 2015-01-06: qty 2

## 2015-01-06 MED ORDER — CHLORHEXIDINE GLUCONATE 4 % EX LIQD: 1.0000 "application " | Freq: Once | CUTANEOUS | Status: DC

## 2015-01-06 MED ORDER — ONDANSETRON HCL 4 MG/2ML IJ SOLN
INTRAMUSCULAR | Status: DC | PRN
  Administered 2015-01-06 (×2): 4 mg via INTRAVENOUS

## 2015-01-06 MED ORDER — DIPHENHYDRAMINE HCL 50 MG/ML IJ SOLN: 12.5000 mg | Freq: Four times a day (QID) | INTRAMUSCULAR | Status: DC | PRN

## 2015-01-06 MED ORDER — CEFAZOLIN SODIUM 1-5 GM-% IV SOLN
1.0000 g | Freq: Three times a day (TID) | INTRAVENOUS | Status: AC
  Administered 2015-01-06 – 2015-01-07 (×2): 1 g via INTRAVENOUS
  Filled 2015-01-06 (×2): qty 50

## 2015-01-06 MED ORDER — PROPOFOL 10 MG/ML IV BOLUS
INTRAVENOUS | Status: AC
  Filled 2015-01-06: qty 20

## 2015-01-06 MED ORDER — INSULIN ASPART 100 UNIT/ML ~~LOC~~ SOLN
0.0000 [IU] | SUBCUTANEOUS | Status: DC
  Administered 2015-01-06: 8 [IU] via SUBCUTANEOUS
  Administered 2015-01-07 (×2): 4 [IU] via SUBCUTANEOUS

## 2015-01-06 MED ORDER — MIDAZOLAM HCL 2 MG/2ML IJ SOLN
INTRAMUSCULAR | Status: AC
  Filled 2015-01-06: qty 2

## 2015-01-06 MED ORDER — FENTANYL CITRATE (PF) 100 MCG/2ML IJ SOLN
25.0000 ug | INTRAMUSCULAR | Status: DC | PRN
  Administered 2015-01-06 (×3): 50 ug via INTRAVENOUS

## 2015-01-06 MED ORDER — ROCURONIUM BROMIDE 100 MG/10ML IV SOLN
INTRAVENOUS | Status: DC | PRN
  Administered 2015-01-06: 50 mg via INTRAVENOUS

## 2015-01-06 MED ORDER — FENTANYL CITRATE (PF) 100 MCG/2ML IJ SOLN
INTRAMUSCULAR | Status: AC
  Administered 2015-01-06: 50 ug via INTRAVENOUS
  Filled 2015-01-06: qty 2

## 2015-01-06 MED ORDER — ONDANSETRON 4 MG PO TBDP
4.0000 mg | ORAL_TABLET | Freq: Four times a day (QID) | ORAL | Status: DC | PRN
  Administered 2015-01-07: 4 mg via ORAL
  Filled 2015-01-06: qty 1

## 2015-01-06 MED ORDER — ONDANSETRON HCL 4 MG/2ML IJ SOLN: 4.0000 mg | Freq: Four times a day (QID) | INTRAMUSCULAR | Status: DC | PRN

## 2015-01-06 MED ORDER — LIDOCAINE HCL (CARDIAC) 20 MG/ML IV SOLN
INTRAVENOUS | Status: DC | PRN
  Administered 2015-01-06: 100 mg via INTRAVENOUS

## 2015-01-06 MED ORDER — HYDROMORPHONE HCL 1 MG/ML IJ SOLN: 0.5000 mg | INTRAMUSCULAR | Status: DC | PRN

## 2015-01-06 MED ORDER — FENTANYL CITRATE (PF) 100 MCG/2ML IJ SOLN
INTRAMUSCULAR | Status: DC | PRN
  Administered 2015-01-06: 100 ug via INTRAVENOUS
  Administered 2015-01-06 (×3): 50 ug via INTRAVENOUS
  Administered 2015-01-06: 100 ug via INTRAVENOUS
  Administered 2015-01-06: 150 ug via INTRAVENOUS

## 2015-01-06 MED ORDER — ONDANSETRON HCL 4 MG/2ML IJ SOLN: 4.0000 mg | Freq: Once | INTRAMUSCULAR | Status: DC | PRN

## 2015-01-06 MED ORDER — LOSARTAN POTASSIUM 50 MG PO TABS
100.0000 mg | ORAL_TABLET | Freq: Every day | ORAL | Status: DC
  Administered 2015-01-06: 100 mg via ORAL
  Filled 2015-01-06 (×2): qty 2

## 2015-01-06 MED ORDER — DEXAMETHASONE SODIUM PHOSPHATE 4 MG/ML IJ SOLN
INTRAMUSCULAR | Status: AC
  Filled 2015-01-06: qty 2

## 2015-01-06 MED ORDER — ACETAMINOPHEN 325 MG PO TABS: 650.0000 mg | ORAL_TABLET | Freq: Two times a day (BID) | ORAL | Status: DC | PRN

## 2015-01-06 MED ORDER — POTASSIUM CHLORIDE IN NACL 20-0.45 MEQ/L-% IV SOLN
INTRAVENOUS | Status: DC
  Administered 2015-01-06: 19:00:00 via INTRAVENOUS
  Filled 2015-01-06 (×3): qty 1000

## 2015-01-06 MED ORDER — CEFAZOLIN SODIUM-DEXTROSE 2-3 GM-% IV SOLR
INTRAVENOUS | Status: AC
  Filled 2015-01-06: qty 50

## 2015-01-06 MED ORDER — DOCUSATE SODIUM 50 MG PO CAPS
50.0000 mg | ORAL_CAPSULE | ORAL | Status: DC | PRN
  Filled 2015-01-06: qty 1

## 2015-01-06 MED ORDER — OXYCODONE HCL 5 MG PO TABS
ORAL_TABLET | ORAL | Status: AC
  Filled 2015-01-06: qty 2

## 2015-01-06 MED ORDER — LACTATED RINGERS IV SOLN
INTRAVENOUS | Status: DC
  Administered 2015-01-06 (×3): via INTRAVENOUS

## 2015-01-06 MED ORDER — OXYCODONE HCL 5 MG PO TABS
5.0000 mg | ORAL_TABLET | ORAL | Status: DC | PRN
  Administered 2015-01-06 – 2015-01-07 (×3): 10 mg via ORAL
  Filled 2015-01-06 (×2): qty 2

## 2015-01-06 MED ORDER — ROCURONIUM BROMIDE 50 MG/5ML IV SOLN
INTRAVENOUS | Status: AC
  Filled 2015-01-06: qty 1

## 2015-01-06 MED ORDER — DIPHENHYDRAMINE HCL 12.5 MG/5ML PO ELIX: 12.5000 mg | ORAL_SOLUTION | Freq: Four times a day (QID) | ORAL | Status: DC | PRN

## 2015-01-06 MED ORDER — PROPOFOL 10 MG/ML IV BOLUS
INTRAVENOUS | Status: DC | PRN
  Administered 2015-01-06: 150 mg via INTRAVENOUS

## 2015-01-06 SURGICAL SUPPLY — 54 items
BAG DECANTER FOR FLEXI CONT (MISCELLANEOUS) ×3 IMPLANT
BINDER BREAST LRG (GAUZE/BANDAGES/DRESSINGS) IMPLANT
BINDER BREAST MEDIUM (GAUZE/BANDAGES/DRESSINGS) IMPLANT
BINDER BREAST XLRG (GAUZE/BANDAGES/DRESSINGS) ×9 IMPLANT
BINDER BREAST XXLRG (GAUZE/BANDAGES/DRESSINGS) IMPLANT
BIOPATCH RED 1 DISK 7.0 (GAUZE/BANDAGES/DRESSINGS) IMPLANT
BIOPATCH RED 1IN DISK 7.0MM (GAUZE/BANDAGES/DRESSINGS)
BLADE HEX COATED 2.75 (ELECTRODE) ×3 IMPLANT
BLADE SURG 10 STRL SS (BLADE) ×21 IMPLANT
BLADE SURG 15 STRL LF DISP TIS (BLADE) ×3 IMPLANT
BLADE SURG 15 STRL SS (BLADE) ×6
BNDG GAUZE ELAST 4 BULKY (GAUZE/BANDAGES/DRESSINGS) ×6 IMPLANT
CANISTER SUCTION 1200CC (MISCELLANEOUS) ×3 IMPLANT
CHLORAPREP W/TINT 26ML (MISCELLANEOUS) ×9 IMPLANT
DECANTER SPIKE VIAL GLASS SM (MISCELLANEOUS) IMPLANT
DRAIN CHANNEL 15F RND FF W/TCR (WOUND CARE) ×3 IMPLANT
DRAPE LAPAROSCOPIC ABDOMINAL (DRAPES) ×3 IMPLANT
DRSG PAD ABDOMINAL 8X10 ST (GAUZE/BANDAGES/DRESSINGS) ×6 IMPLANT
ELECT BLADE 4.0 EZ CLEAN MEGAD (MISCELLANEOUS) ×3
ELECT COATED BLADE 2.86 ST (ELECTRODE) ×6 IMPLANT
ELECT REM PT RETURN 9FT ADLT (ELECTROSURGICAL) ×3
ELECTRODE BLDE 4.0 EZ CLN MEGD (MISCELLANEOUS) ×1 IMPLANT
ELECTRODE REM PT RTRN 9FT ADLT (ELECTROSURGICAL) ×1 IMPLANT
EVACUATOR SILICONE 100CC (DRAIN) IMPLANT
GLOVE BIO SURGEON STRL SZ 6 (GLOVE) ×12 IMPLANT
GLOVE BIO SURGEON STRL SZ7 (GLOVE) ×3 IMPLANT
GLOVE BIOGEL PI IND STRL 6.5 (GLOVE) ×2 IMPLANT
GLOVE BIOGEL PI INDICATOR 6.5 (GLOVE) ×4
GLOVE ECLIPSE 6.5 STRL STRAW (GLOVE) ×3 IMPLANT
GOWN STRL REUS W/ TWL LRG LVL3 (GOWN DISPOSABLE) ×4 IMPLANT
GOWN STRL REUS W/TWL LRG LVL3 (GOWN DISPOSABLE) ×8
LIQUID BAND (GAUZE/BANDAGES/DRESSINGS) ×9 IMPLANT
MARKER SKIN DUAL TIP RULER LAB (MISCELLANEOUS) ×3 IMPLANT
NEEDLE HYPO 25X1 1.5 SAFETY (NEEDLE) IMPLANT
NEEDLE SPNL 22GX3.5 QUINCKE BK (NEEDLE) ×3 IMPLANT
NS IRRIG 1000ML POUR BTL (IV SOLUTION) ×6 IMPLANT
PACK GENERAL/GYN (CUSTOM PROCEDURE TRAY) ×3 IMPLANT
PACK UNIVERSAL I (CUSTOM PROCEDURE TRAY) ×3 IMPLANT
PAD ABD 8X10 STRL (GAUZE/BANDAGES/DRESSINGS) ×6 IMPLANT
SPONGE LAP 18X18 X RAY DECT (DISPOSABLE) ×12 IMPLANT
STAPLER VISISTAT 35W (STAPLE) ×9 IMPLANT
SUT ETHILON 2 0 FS 18 (SUTURE) ×9 IMPLANT
SUT MNCRL AB 4-0 PS2 18 (SUTURE) ×12 IMPLANT
SUT VIC AB 3-0 PS2 18 (SUTURE) ×10
SUT VIC AB 3-0 PS2 18XBRD (SUTURE) ×5 IMPLANT
SUT VIC AB 3-0 SH 27 (SUTURE) ×2
SUT VIC AB 3-0 SH 27X BRD (SUTURE) ×1 IMPLANT
SUT VICRYL 4-0 PS2 18IN ABS (SUTURE) ×9 IMPLANT
SYR 50ML LL SCALE MARK (SYRINGE) IMPLANT
SYR CONTROL 10ML LL (SYRINGE) IMPLANT
TOWEL OR 17X24 6PK STRL BLUE (TOWEL DISPOSABLE) ×6 IMPLANT
TUBE CONNECTING 20'X1/4 (TUBING) ×1
TUBE CONNECTING 20X1/4 (TUBING) ×2 IMPLANT
YANKAUER SUCT BULB TIP NO VENT (SUCTIONS) ×6 IMPLANT

## 2015-01-06 NOTE — Interval H&P Note (Signed)
History and Physical Interval Note:  01/06/2015 6:55 AM  Darlene [name]  has presented today for surgery, with the diagnosis of RIGHT BREAST CANCER  The various methods of treatment have been discussed with the patient and family. After consideration of risks, benefits and other options for treatment, the patient has consented to  Procedure(s): BILATERAL BREAST REDUCTION, RIGHT ONCOPLASTIC RECONSTRUCTION),LEFT BREAST REDUCTION FOR SYMMETRY (Bilateral) as a surgical intervention .  The patient's history has been reviewed, patient examined, no change in status, stable for surgery.  I have reviewed the patient's chart and labs.  Questions were answered to the patient's satisfaction.     THIMMAPPA, BRINDA

## 2015-01-06 NOTE — Anesthesia Preprocedure Evaluation (Addendum)
Anesthesia Evaluation  Patient identified by MRN, date of birth, ID band Patient awake    Reviewed: Allergy & Precautions, NPO status , Patient's Chart, lab work & pertinent test results  Airway Mallampati: II  TM Distance: >3 FB Neck ROM: Full    Dental  (+) Teeth Intact, Dental Advisory Given   Pulmonary sleep apnea and Continuous Positive Airway Pressure Ventilation , pneumonia, resolved,    Pulmonary exam normal breath sounds clear to auscultation       Cardiovascular hypertension, Pt. on medications (-) angina(-) CAD and (-) Past MI Normal cardiovascular exam Rhythm:Regular Rate:Normal     Neuro/Psych PSYCHIATRIC DISORDERS Anxiety Depression  Neuromuscular disease    GI/Hepatic Neg liver ROS, S/p gastric bypass   Endo/Other  diabetes, Insulin DependentObesity   Renal/GU negative Renal ROS     Musculoskeletal  (+) Arthritis , Osteoarthritis,    Abdominal   Peds  Hematology  (+) Blood dyscrasia, anemia ,   Anesthesia Other Findings Day of surgery medications reviewed with the patient.  Breast Cancer  Reproductive/Obstetrics negative OB ROS                            Anesthesia Physical Anesthesia Plan  ASA: II  Anesthesia Plan: General   Post-op Pain Management:    Induction: Intravenous  Airway Management Planned: Oral ETT  Additional Equipment:   Intra-op Plan:   Post-operative Plan: Extubation in OR  Informed Consent: I have reviewed the patients History and Physical, chart, labs and discussed the procedure including the risks, benefits and alternatives for the proposed anesthesia with the patient or authorized representative who has indicated his/her understanding and acceptance.   Dental advisory given  Plan Discussed with: CRNA  Anesthesia Plan Comments: (Risks/benefits of general anesthesia discussed with patient including risk of damage to teeth, lips, gum, and  tongue, nausea/vomiting, allergic reactions to medications, and the possibility of heart attack, stroke and death.  All patient questions answered.  Patient wishes to proceed.)       Anesthesia Quick Evaluation

## 2015-01-06 NOTE — Transfer of Care (Signed)
Immediate Anesthesia Transfer of Care Note  Patient: Darlene [name]  Procedure(s) Performed: Procedure(s): BILATERAL BREAST REDUCTION, RIGHT ONCOPLASTIC RECONSTRUCTION),LEFT BREAST REDUCTION FOR SYMMETRY (Bilateral)  Patient Location: PACU  Anesthesia Type:General  Level of Consciousness: awake, alert , oriented and patient cooperative  Airway & Oxygen Therapy: Patient Spontanous Breathing and Patient connected to nasal cannula oxygen  Post-op Assessment: Report given to RN and Post -op Vital signs reviewed and stable  Post vital signs: Reviewed and stable  Last Vitals:  Filed Vitals:   01/06/15 1002 01/06/15 1558  BP: 143/43 172/62  Pulse: 49   Temp: 36.7 C 36.5 C  Resp: 16 16    Complications: No apparent anesthesia complications

## 2015-01-06 NOTE — Progress Notes (Signed)
Patient placed on CPAP auto-titration mode (min 4, max 14) as patient is unaware of her home settings.  Settings were titrated for patient comfort.  Nasal mask used, room air.  Patient is familiar with equipment and procedure.

## 2015-01-06 NOTE — Anesthesia Postprocedure Evaluation (Signed)
Anesthesia Post Note  Patient: ETOY LATTIN  Procedure(s) Performed: Procedure(s) (LRB): BILATERAL BREAST REDUCTION, RIGHT ONCOPLASTIC RECONSTRUCTION),LEFT BREAST REDUCTION FOR SYMMETRY (Bilateral)  Patient location during evaluation: PACU Anesthesia Type: General Level of consciousness: awake and alert, oriented, awake and patient cooperative Pain management: pain level controlled Vital Signs Assessment: post-procedure vital signs reviewed and stable Respiratory status: spontaneous breathing, nonlabored ventilation, respiratory function stable and patient connected to nasal cannula oxygen Cardiovascular status: blood pressure returned to baseline and stable Postop Assessment: no signs of nausea or vomiting Anesthetic complications: no    Last Vitals:  Filed Vitals:   01/06/15 1613 01/06/15 1628  BP: 171/62 166/60  Pulse: 89 89  Temp:    Resp: 15 13    Last Pain:  Filed Vitals:   01/06/15 1707  PainSc: 7                  Catalina Gravel

## 2015-01-06 NOTE — Op Note (Signed)
Operative Note   DATE OF OPERATION: 12.16.16  LOCATION: [facility]- observation  SURGICAL DIVISION: Plastic Surgery  PREOPERATIVE DIAGNOSES:  1. Right breast cancer 2. Post lumpectomy right breast. 3. Post neoadjuvant chemotherapy 4. Macromastia  POSTOPERATIVE DIAGNOSES:  same  PROCEDURE:  1. Bilateral breast reduction (Right oncoplastic reconstruction, left breast reduction for symmetry)  SURGEON: [name] MD MBA  ASSISTANT: none  ANESTHESIA:  General.   EBL: 123456 ml  COMPLICATIONS: None immediate.   INDICATIONS FOR PROCEDURE:  The patient, Darlene [name], is a 64 y.o. female born on 1950-03-13, is here for oncoplastic reconstruction by breast reduction following recent right lumpectomy for cancer. Margins clear from lumpectomy. Patient also has a history of massive weight loss following surgical weight loss surgery.   FINDINGS: Patient presented with edematous right breast and spontaneously draining seroma through suspect burn to right lateral breast skin over lumpectomy site. Right breast reduction 676 g L eft breast reduction 489 g  DESCRIPTION OF PROCEDURE:  The patient was marked standing in the preoperative area to mark sternal notch, chest midline, anterior axillary lines, inframammary folds. The location of new nipple areolar complex was marked at level of inframammary fold on anterior surface breast by palpation. This was marked symmetric over bilateral breasts. With aid of Wise pattern marker, location of new nipple areolar complex and vertical limbs (8 cm) were marked. The patient was taken to the operating room. SCDs were placed and IV antibiotics were given. The patient's operative site was prepped and draped in a sterile fashion. A time out was performed and all information was confirmed to be correct.   Over left breast, superomedial pedicle marked and nipple areolar complex marked with 50 mm diameter marker.  Pedicle deepithlialized and developed to  approximately 4 cm thickness and dissected to chest wall. Skin and soft tissue over inferior pole resected. Additional tissue resected from location of new nipple areolar complex.Breast tailor tacked closed.  I then directed attention to right breast where similar superomedial pedicle deepithelialized and developed to chest wall. Lumpectomy cavity encountered over upper outer quadrant breast. Cavity had been marked with clips by Dr. Barry Dienes.  As noted in findings, open wound noted at anterior skin surface of lumpectomy cavity with serous fluid draining, suspect thermal injury from cautery. Sharp excision of skin margins completed and this wound closed with 4-0 vicryl in dermis and 4-0 monocryl simple skin closure. The lumpectomy cavity was approximated with interrupted 3-0 vicryl. The lateral pillar of breast was elevated beneath the lumpectomy cavity to allow for redraping of skin.The soft tissue and skin over inferior pole resected. The patient was tailor tacked closed. Patient brought to upright sitting position and assessed for symmetry. Additional skin for excision marked by tailor tacking. Pre operatively right breast was noted to have larger volume than right; her tissue was quite edematous as well from her recent lumpectomy. Patent returned to supine position and breast cavities irrigated and hemostasis obtained. 15 Fr JP drain placed in each cavity and secured with 2-0 nylon. Closure completed with 3-0 vicryl to approximate dermis along inframammary fold. NAC inset and vertical limb closed with 4-0 vicryl in dermis.Skin closure completed with 4-0 monocryl subcuticular throughout.Tissue adhesive applied. Dry dressing and breast binder applied.   The patient was allowed to wake from anesthesia, extubated and taken to the recovery room in satisfactory condition.   SPECIMENS: Right breast tissue, Left breast tissue  DRAINS: 15 Fr JP in right and left breast  [name], MD [facility] Plastic &  Reconstructive Surgery [phone]

## 2015-01-07 DIAGNOSIS — N651 Disproportion of reconstructed breast: Secondary | ICD-10-CM | POA: Diagnosis not present

## 2015-01-07 LAB — GLUCOSE, CAPILLARY
Glucose-Capillary: 111 mg/dL — ABNORMAL HIGH (ref 65–99)
Glucose-Capillary: 165 mg/dL — ABNORMAL HIGH (ref 65–99)
Glucose-Capillary: 176 mg/dL — ABNORMAL HIGH (ref 65–99)

## 2015-01-07 NOTE — Discharge Planning (Signed)
AVS and jp supplies and 4 ABD's to patient who verbalizes understanding. Dc'd per w/c to private car home with all personal belongings accompanied by husband.

## 2015-01-07 NOTE — Discharge Summary (Signed)
Physician Discharge Summary  Patient ID: Darlene [name] MRN: [id number] DOB/AGE: 62/16/1952 62 y.o.  Admit date: 01/06/2015 Discharge date: 01/07/2015  Admission Diagnoses:  Right breast cancer  Discharge Diagnoses:  same  Discharged Condition: stable  Hospital Course: Post operatively patient pain controlled with oxycodone. Instructed on drain care. Reviewed intraop findings. On POD#1 complained of soreness coccyx and breasts, but ambulating with minimal assist, tolerating diet and oral pain medication.  Treatments: surgery: right oncoplastic reconstruction, left breast reduction for symmetry  Discharge Exam: Blood pressure 131/49, pulse 90, temperature 98.2 F (36.8 C), temperature source Oral, resp. rate 19, height 5\' 9"  (1.753 m), weight 92.987 kg (205 lb), last menstrual period 10/22/2007, SpO2 98 %. Incision/Wound: incisions intact with scant drainage on dressing, Nipple areola with some hyperemia, viable, breast soft, drains serosanguinous  Disposition: 01-Home or Self Care  Discharge Instructions    Call MD for:  redness, tenderness, or signs of infection (pain, swelling, bleeding, redness, odor or green/yellow discharge around incision site)    Complete by:  As directed      Call MD for:  temperature >100.5    Complete by:  As directed      Discharge instructions    Complete by:  As directed   Ok to remove dressings and shower am 01/08/15. Soap and water ok, pat incisions dry. Dry dressing as desired. Breast binder or soft sports bra all other times.  Strip and record drains twice daily and bring log to clinic visit.  Ok to raise arms above head for dressing and bathing. Do not let drains dangle in shower, place on lanyard or similar.  No strenuous activity or exercise or housework for 2 weeks  Patient has oxycodone at home, no additional Rx given     Driving Restrictions    Complete by:  As directed   No driving while taking narcotics     Lifting  restrictions    Complete by:  As directed   No lifting greater than 5 lbs for 2 weeks     Resume previous diet    Complete by:  As directed             Medication List    TAKE these medications        acetaminophen 325 MG tablet  Commonly known as:  TYLENOL  Take 650 mg by mouth 2 (two) times daily as needed for moderate pain or headache.     CALCIUM CITRATE+D3 PO  Take 1,800 mg by mouth daily.     cholestyramine 4 G packet  Commonly known as:  QUESTRAN  Take 1 packet (4 g total) by mouth 2 (two) times daily.     clotrimazole 1 % cream  Commonly known as:  LOTRIMIN  Apply 1 application topically 2 (two) times daily as needed (rash).     [employer] PROBIOTIC PO  Take 1 capsule by mouth daily.     diphenhydrAMINE 25 MG tablet  Commonly known as:  BENADRYL  Take 25 mg by mouth daily as needed for allergies or sleep.     diphenoxylate-atropine 2.5-0.025 MG tablet  Commonly known as:  LOMOTIL  Take 1 tablet by mouth 4 (four) times daily as needed for diarrhea or loose stools.     docusate sodium 50 MG capsule  Commonly known as:  COLACE  Take 50 mg by mouth as needed for mild constipation.     HERCEPTIN IV  Inject into the vein. Infusion at [facility]  losartan 100 MG tablet  Commonly known as:  COZAAR  Take 1 tablet by mouth daily.     Melatonin 5 MG Tabs  Take 5 mg by mouth at bedtime as needed (sleep).     mometasone 50 MCG/ACT nasal spray  Commonly known as:  NASONEX  Place 2 sprays into the nose daily as needed (allergies).     multivitamin with minerals Tabs tablet  Take 1 tablet by mouth 2 (two) times daily. With iron     NOVOLIN 70/30 (70-30) 100 UNIT/ML injection  Generic drug:  insulin NPH-regular Human  Inject 10 Units into the skin 2 (two) times daily with a meal. 10 units two times a day     ondansetron 8 MG tablet  Commonly known as:  ZOFRAN  Take 1 tablet (8 mg total) by mouth every 8 (eight) hours as needed for nausea or vomiting.     ONETOUCH  VERIO test strip  Generic drug:  glucose blood  1 each by Other route as needed for other. Use as instructed     Vortioxetine HBr 20 MG Tabs  Commonly known as:  BRINTELLIX  Take 20 mg by mouth daily.           Follow-up Information    Follow up with [facility], Arnoldo Hooker, MD In 1 week.   Specialty:  Plastic Surgery   Why:  as scheduled   Contact information:   [address] [place] [id number] [phone]       Signed: [name] 01/07/2015, 9:52 AM

## 2015-01-09 ENCOUNTER — Encounter (HOSPITAL_COMMUNITY): Payer: Self-pay | Admitting: Plastic Surgery

## 2015-01-09 NOTE — Progress Notes (Signed)
Location of Breast Cancer: right breast cancer  Histology per Pathology Report:   01/06/15 Diagnosis 1. Breast, Mammoplasty, Left - BENIGN BREAST PARENCHYMA. - THERE IS NO EVIDENCE OF MALIGNANCY. 2. Breast, Mammoplasty, Right - INTRALYMPHATIC EMBOLI OF DUCTAL CARCINOMA. - SEE COMMENT.  12/27/14 Diagnosis 1. Breast, lumpectomy, Right - INVASIVE GRADE II DUCTAL CARCINOMA, ESTIMATED TO SPAN 4 CM IN GREATEST DIMENSION. - ASSOCIATED HIGH GRADE DUCTAL CARCINOMA IN SITU. - SEE COMMENT BENEATH ONCOLOGY TEMPLATE BELOW FOR MARGIN STATUS. - SEE ONCOLOGY TEMPLATE. 2. Lymph node, sentinel, biopsy, Right #1 - ONE LYMPH NODE POSITIVE FOR METASTATIC DUCTAL CARCINOMA (1/1). 3. Lymph node, sentinel, biopsy, Right #2 - ONE BENIGN LYMPH NODE WITH NO TUMOR SEEN (0/1). 4. Lymph node, sentinel, biopsy, Right #3 - ONE BENIGN LYMPH NODE WITH NO TUMOR SEEN (0/1).  07/19/14 Diagnosis 1. Breast, right, needle core biopsy, 10 o'clock - INVASIVE DUCTAL CARCINOMA, SEE COMMENT. - DUCTAL CARCINOMA IN SITU. 2. Lymph node, needle/core biopsy - INVASIVE MAMMARY CARCINOMA WITH LYMPHOCYTIC INFLAMMATION, SEE COMMENT.  Receptor Status: ER(0%), PR (0%), Her2-neu (positive)  Did patient present with symptoms (if so, please note symptoms) or was this found on screening mammography?: palpated a lump last June.  Past/Anticipated interventions by surgeon, if any: 12/27/14 - Procedure: BREAST LUMPECTOMY WITH RADIOACTIVE SEED AND SENTINEL LYMPH NODE BIOPSY;  Surgeon: Faera Byerly, MD;  Location: [facility];  Service: General;  Laterality: Right; 01/06/15 - Procedure: BILATERAL BREAST REDUCTION, RIGHT ONCOPLASTIC RECONSTRUCTION),LEFT BREAST REDUCTION FOR SYMMETRY;  Surgeon: Brinda Thimmappa, MD;  Location: [facility] OR;  Service: Plastics;  Laterality: Bilateral  Past/Anticipated interventions by medical oncology, if any: neoadjuvant chemotherapy started 08/08/2014, consisting of carboplatin, docetaxel, trastuzumab and  pertuzumab given every 3 weeks 6, completed 11/21/2014. trastuzumab to be continued to complete one year (through mid-July 2017).  Possible capecitabine after restaging.  Lymphedema issues, if any:  No - does have some swelling in right breast from surgery.   Pain issues, if any:  no    GYNECOLOGIC HISTORY: Patient's last menstrual period was 10/22/2007. menarche age 14, the patient is GX P0. She went through the change of life in 2011. She did not take hormone replacement.   SAFETY ISSUES:  Prior radiation? no  Pacemaker/ICD? no  Possible current pregnancy?no  Is the patient on methotrexate? no  Current Complaints / other details:  Patient will have restaging CT scan and bone scan on 02/01/14.  Patient is here with her husband.  BP 148/60 mmHg  Pulse 50  Temp(Src) 98.1 F (36.7 C) (Oral)  Resp 16  Ht 5' 9" (1.753 m)  Wt 208 lb 3.2 oz (94.439 kg)  BMI 30.73 kg/m2  LMP 10/22/2007      

## 2015-01-18 ENCOUNTER — Telehealth: Payer: Self-pay | Admitting: *Deleted

## 2015-01-18 NOTE — Telephone Encounter (Signed)
Patient called.  "I would like to speak with [employer].  I need something that shows I am under treatment.  This can be faxed to [employer] to receive cleaning services.  Call transferred to [facility].

## 2015-01-23 ENCOUNTER — Other Ambulatory Visit: Payer: Self-pay | Admitting: Oncology

## 2015-01-24 ENCOUNTER — Other Ambulatory Visit: Payer: Self-pay | Admitting: *Deleted

## 2015-01-24 ENCOUNTER — Telehealth: Payer: Self-pay | Admitting: Oncology

## 2015-01-24 ENCOUNTER — Other Ambulatory Visit (HOSPITAL_BASED_OUTPATIENT_CLINIC_OR_DEPARTMENT_OTHER): Payer: BLUE CROSS/BLUE SHIELD

## 2015-01-24 ENCOUNTER — Ambulatory Visit (HOSPITAL_BASED_OUTPATIENT_CLINIC_OR_DEPARTMENT_OTHER): Payer: BLUE CROSS/BLUE SHIELD

## 2015-01-24 ENCOUNTER — Ambulatory Visit (HOSPITAL_BASED_OUTPATIENT_CLINIC_OR_DEPARTMENT_OTHER): Payer: BLUE CROSS/BLUE SHIELD | Admitting: Oncology

## 2015-01-24 VITALS — BP 149/54 | HR 64 | Temp 98.1°F | Resp 18 | Ht 69.0 in | Wt 208.7 lb

## 2015-01-24 DIAGNOSIS — C773 Secondary and unspecified malignant neoplasm of axilla and upper limb lymph nodes: Secondary | ICD-10-CM

## 2015-01-24 DIAGNOSIS — C50411 Malignant neoplasm of upper-outer quadrant of right female breast: Secondary | ICD-10-CM

## 2015-01-24 DIAGNOSIS — Z171 Estrogen receptor negative status [ER-]: Secondary | ICD-10-CM | POA: Diagnosis not present

## 2015-01-24 DIAGNOSIS — Z5112 Encounter for antineoplastic immunotherapy: Secondary | ICD-10-CM

## 2015-01-24 LAB — COMPREHENSIVE METABOLIC PANEL
ALT: 23 U/L (ref 0–55)
AST: 21 U/L (ref 5–34)
Albumin: 3 g/dL — ABNORMAL LOW (ref 3.5–5.0)
Alkaline Phosphatase: 90 U/L (ref 40–150)
Anion Gap: 7 mEq/L (ref 3–11)
BUN: 17.9 mg/dL (ref 7.0–26.0)
CO2: 28 mEq/L (ref 22–29)
Calcium: 8.9 mg/dL (ref 8.4–10.4)
Chloride: 109 mEq/L (ref 98–109)
Creatinine: 1.1 mg/dL (ref 0.6–1.1)
EGFR: 52 mL/min/{1.73_m2} — ABNORMAL LOW (ref >90)
Glucose: 142 mg/dl — ABNORMAL HIGH (ref 70–140)
Potassium: 4.6 mEq/L (ref 3.5–5.1)
Sodium: 144 mEq/L (ref 136–145)
Total Bilirubin: 0.47 mg/dL (ref 0.20–1.20)
Total Protein: 6.1 g/dL — ABNORMAL LOW (ref 6.4–8.3)

## 2015-01-24 LAB — CBC WITH DIFFERENTIAL/PLATELET
BASO%: 0.3 % (ref 0.0–2.0)
Basophils Absolute: 0 10*3/uL (ref 0.0–0.1)
EOS%: 2.4 % (ref 0.0–7.0)
Eosinophils Absolute: 0.2 10*3/uL (ref 0.0–0.5)
HCT: 26.5 % — ABNORMAL LOW (ref 34.8–46.6)
HGB: 8.6 g/dL — ABNORMAL LOW (ref 11.6–15.9)
LYMPH%: 25.6 % (ref 14.0–49.7)
MCH: 28.6 pg (ref 25.1–34.0)
MCHC: 32.4 g/dL (ref 31.5–36.0)
MCV: 88.3 fL (ref 79.5–101.0)
MONO#: 0.4 10*3/uL (ref 0.1–0.9)
MONO%: 6.5 % (ref 0.0–14.0)
NEUT#: 4.5 10*3/uL (ref 1.5–6.5)
NEUT%: 65.2 % (ref 38.4–76.8)
Platelets: 317 10*3/uL (ref 145–400)
RBC: 3 10*6/uL — ABNORMAL LOW (ref 3.70–5.45)
RDW: 15.8 % — ABNORMAL HIGH (ref 11.2–14.5)
WBC: 6.9 10*3/uL (ref 3.9–10.3)
lymph#: 1.8 10*3/uL (ref 0.9–3.3)

## 2015-01-24 MED ORDER — DIPHENHYDRAMINE HCL 25 MG PO CAPS
ORAL_CAPSULE | ORAL | Status: AC
  Filled 2015-01-24: qty 1

## 2015-01-24 MED ORDER — SODIUM CHLORIDE 0.9 % IV SOLN
Freq: Once | INTRAVENOUS | Status: AC
  Administered 2015-01-24: 12:00:00 via INTRAVENOUS

## 2015-01-24 MED ORDER — ACETAMINOPHEN 325 MG PO TABS
650.0000 mg | ORAL_TABLET | Freq: Once | ORAL | Status: AC
  Administered 2015-01-24: 650 mg via ORAL

## 2015-01-24 MED ORDER — SODIUM CHLORIDE 0.9 % IJ SOLN
10.0000 mL | INTRAMUSCULAR | Status: DC | PRN
  Administered 2015-01-24: 10 mL
  Filled 2015-01-24: qty 10

## 2015-01-24 MED ORDER — DIPHENHYDRAMINE HCL 25 MG PO CAPS
25.0000 mg | ORAL_CAPSULE | Freq: Once | ORAL | Status: AC
  Administered 2015-01-24: 25 mg via ORAL

## 2015-01-24 MED ORDER — HEPARIN SOD (PORK) LOCK FLUSH 100 UNIT/ML IV SOLN
500.0000 [IU] | Freq: Once | INTRAVENOUS | Status: AC | PRN
  Administered 2015-01-24: 500 [IU]
  Filled 2015-01-24: qty 5

## 2015-01-24 MED ORDER — TRASTUZUMAB CHEMO INJECTION 440 MG
6.0000 mg/kg | Freq: Once | INTRAVENOUS | Status: AC
  Administered 2015-01-24: 567 mg via INTRAVENOUS
  Filled 2015-01-24: qty 27

## 2015-01-24 MED ORDER — ACETAMINOPHEN 325 MG PO TABS
ORAL_TABLET | ORAL | Status: AC
  Filled 2015-01-24: qty 2

## 2015-01-24 NOTE — Telephone Encounter (Signed)
Appointments made and avs printed for patient,inbox to dr Jana Hakim to change the expected date if he needs scans sooner

## 2015-01-24 NOTE — Patient Instructions (Signed)
[facility] Discharge Instructions for Patients Receiving Chemotherapy  Today you received the following chemotherapy agents: Herceptin   To help prevent nausea and vomiting after your treatment, we encourage you to take your nausea medication as directed.    If you develop nausea and vomiting that is not controlled by your nausea medication, call the clinic.   BELOW ARE SYMPTOMS THAT SHOULD BE REPORTED IMMEDIATELY:  *FEVER GREATER THAN 100.5 F  *CHILLS WITH OR WITHOUT FEVER  NAUSEA AND VOMITING THAT IS NOT CONTROLLED WITH YOUR NAUSEA MEDICATION  *UNUSUAL SHORTNESS OF BREATH  *UNUSUAL BRUISING OR BLEEDING  TENDERNESS IN MOUTH AND THROAT WITH OR WITHOUT PRESENCE OF ULCERS  *URINARY PROBLEMS  *BOWEL PROBLEMS  UNUSUAL RASH Items with * indicate a potential emergency and should be followed up as soon as possible.  Feel free to call the clinic you have any questions or concerns. The clinic phone number is [phone].  Please show the CHEMO ALERT CARD at check-in to the Emergency Department and triage nurse.   

## 2015-01-24 NOTE — Progress Notes (Signed)
[place]  [phone] [phone]     ID: Darlene [name] DOB: [birth year]  MR#: [id number]  ZDG#:[id number]  Patient Care Team: Thressa Sheller, MD as PCP - General (Internal Medicine) Bryson Ha Himmelrich, RD as Dietitian (Bariatrics) Stark Klein, MD as Consulting Physician (General Surgery) Chauncey Cruel, MD as Consulting Physician (Oncology) Arloa Koh, MD as Consulting Physician (Radiation Oncology) Mauro Kaufmann, RN as Registered Nurse Rockwell Germany, RN as Registered Nurse Jacelyn Pi, MD as Consulting Physician (Endocrinology) PCP: Thressa Sheller, MD GYN: Kem Boroughs FNP OTHER MD:  CHIEF COMPLAINT:  HER-2 positive , estrogen receptor negative breast cancer  CURRENT TREATMENT:  anti-HER-2 treatment  BREAST CANCER HISTORY: From the original intake note:  "Darlene [name]" had screening mammography at her gynecologist suggestive of a possible abnormality in the right breast. However right diagnostic mamm[facility] 04/12/2013 at [facility] was negative. More recently however, she noted a lump in her right breast and brought it to her gynecologist's attention. On 07/18/2014 the patient had bilateral diagnostic mammography with tomosynthesis at [facility]. The breast density was category B. In the right breast upper outer quadrant there was an area of focal asymmetry with indistinct margins.[name] Ultrasound was obtained and showed in addition to the mass in question, measuring 1.7 cm, an abnormal-appearing lymph node in the right axillary tail.  On 07/19/2014 the patient underwent biopsy of the right breast massin question as well as a right axillary lymph node. Both were positive for invasive ductal carcinoma, grade 2, estrogen and progesterone receptor negative, with an MIB-1 of 90%, and HER-2 pending. Incidentally the lymph node biopsy showed a lymphocytic inflammatory component but no lymph node architecture.  Her subsequent history is as detailed  below.  INTERVAL HISTORY:  Darlene [name] returns today for follow-up of her HER-2 positive breast cancer, accompanied by her husband Darlene [name]. Since her last visit here she had her right lumpectomy and sentinel lymph node sampling. Even though the restaging radiologic studies had been very favorable, the pathology from this procedure ([id number] [id number]) showed an area of 4 cm of residual invasive ductal carcinoma, grade 2, with very close though negative margins. Repeat estrogen and progesterone receptors were again negative. One out of 3 sentinel lymph nodes was positive, without extracapsular extension.  This was a ready discouraging to her, but then on 01/06/2015 she underwent right breast uncle plastic reconstruction and left breast reduction for symmetry. The pathology from this procedure ([id number] [id number]) unexpectedly showed multiple foci of intralymphatic carcinoma in random sections from the right mammoplasty. Darlene [name] has been very upset over the past 3 weeks because she has not felt she got a good understanding of what this means or what needs to happen from this point   REVIEW OF SYSTEMS: Darlene [name] did well with the surgeries, with no significant problems with pain, bleeding, or fever. She is satisfied with the cosmetic results. She is very anxious and wonders if she should have scans that only because her tumor proved to be lymph node positive but because she had a friend recently who had minimal symptoms and was found to have metastatic disease "all over including the brain". She does have some arthritis an bony pains. She is fatigued..her bowel movements are little more normal. She has some upper respiratory symptoms.detailed review of systems today was otherwise stable.  PAST MEDICAL HISTORY: Past Medical History  Diagnosis Date  . Obesity   . Hypertension   . Hyperlipidemia   . Diabetes mellitus without complication ([place])     01+  years  . Eating disorder     binge eating  . Balance problem   . Arthritis      in fingers, shoulders  . Anemia     hx of  . Depression   . Ovarian cyst rupture     possible  . Clumsiness   . Breast cancer of upper-outer quadrant of right female breast ([place]) 07/22/2014  . Breast cancer ([place])   . Anxiety   . Pneumonia   . Neuromuscular disorder ([place])     diabetic neuropathy  . Obstructive sleep apnea on CPAP     uses cpap nightly  . Heart murmur     PAST SURGICAL HISTORY: Past Surgical History  Procedure Laterality Date  . Carpal tunnel release Right   . Carpal tunnel release Left   . Breath tek h pylori N/A 09/15/2012    Procedure: BREATH TEK H PYLORI;  Surgeon: Pedro Earls, MD;  Location: [facility];  Service: General;  Laterality: N/A;  . Dupuytren contracture release    . Trigger finger release      x3  . Toe surgery  1970s    bone spur  . Rotator cuff repair Right 2005  . Gastric roux-en-y N/A 11/02/2012    Procedure: LAPAROSCOPIC ROUX-EN-Y GASTRIC;  Surgeon: Pedro Earls, MD;  Location: [facility];  Service: General;  Laterality: N/A;  . Bunionectomy Left 12/2013  . Portacath placement Left 08/02/2014    Procedure: INSERTION PORT-A-CATH;  Surgeon: Stark Klein, MD;  Location: [place];  Service: General;  Laterality: Left;  . Breast lumpectomy with radioactive seed and sentinel lymph node biopsy Right 12/27/2014    Procedure: BREAST LUMPECTOMY WITH RADIOACTIVE SEED AND SENTINEL LYMPH NODE BIOPSY;  Surgeon: Stark Klein, MD;  Location: [place];  Service: General;  Laterality: Right;  . Breast reduction surgery Bilateral 01/06/2015    Procedure: BILATERAL BREAST REDUCTION, RIGHT ONCOPLASTIC RECONSTRUCTION),LEFT BREAST REDUCTION FOR SYMMETRY;  Surgeon: [name], MD;  Location: [place];  Service: Plastics;  Laterality: Bilateral;    FAMILY HISTORY Family History  Problem Relation Age of Onset  . CVA Mother   . Diabetes Father   . Heart failure Father   . Hypertension Sister   . Diabetes Brother   .  Diabetes Paternal Uncle    the patient's father died from complications of diabetes at age 51. The patient's mother died at age 107 with a subarachnoid hemorrhage. The patient had one brother, one sister. The only breast cancer was the patient's paternal grandmother diagnosed in her 84s. There is no history of ovarian cancer in the family.  GYNECOLOGIC HISTORY:  Patient's last menstrual period was 10/22/2007.  menarche age 21, the patient is GX P0. She went through the change of life in 2011. She did not take hormone replacement.  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 Her husband Darlene [name] is a retired Visual merchandiser (he taught at [employer]).  Darlene [name] has 2 children from an earlier marriage, Rockey Situ who teaches in [place], and Savera Donson,  who works at the [employer] in in [place]. He has 1 grand son, aged 79. The patient and her husband are not church attender's    ADVANCED DIRECTIVES:  Not in place   HEALTH MAINTENANCE: Social History  Substance Use Topics  . Smoking status: Never Smoker   . Smokeless tobacco: Never Used  . Alcohol Use: 0.0 oz/week  0 Standard drinks or equivalent per week     Comment: social     Colonoscopy:  May 2016  PAP:  Bone density: 07/05/2013 normal, with a T score of 0.4  Lipid panel:  Allergies  Allergen Reactions  . Other Itching    Peanuts in large quantities   . Tape Hives and Rash    Current Outpatient Prescriptions  Medication Sig Dispense Refill  . acetaminophen (TYLENOL) 325 MG tablet Take 650 mg by mouth 2 (two) times daily as needed for moderate pain or headache.    . Calcium Citrate-Vitamin D (CALCIUM CITRATE+D3 PO) Take 1,800 mg by mouth daily.     . cholestyramine (QUESTRAN) 4 G packet Take 1 packet (4 g total) by mouth 2 (two) times daily. (Patient not taking: Reported on 01/02/2015) 60 each 1  . clotrimazole (LOTRIMIN) 1 % cream Apply 1  application topically 2 (two) times daily as needed (rash).    . diphenhydrAMINE (BENADRYL) 25 MG tablet Take 25 mg by mouth daily as needed for allergies or sleep.    . diphenoxylate-atropine (LOMOTIL) 2.5-0.025 MG per tablet Take 1 tablet by mouth 4 (four) times daily as needed for diarrhea or loose stools. (Patient not taking: Reported on 01/02/2015) 60 tablet 1  . docusate sodium (COLACE) 50 MG capsule Take 50 mg by mouth as needed for mild constipation.    [name] glucose blood (ONETOUCH VERIO) test strip 1 each by Other route as needed for other. Use as instructed    . losartan (COZAAR) 100 MG tablet Take 1 tablet by mouth daily.     . Melatonin 5 MG TABS Take 5 mg by mouth at bedtime as needed (sleep).    . mometasone (NASONEX) 50 MCG/ACT nasal spray Place 2 sprays into the nose daily as needed (allergies).     . Multiple Vitamin (MULTIVITAMIN WITH MINERALS) TABS tablet Take 1 tablet by mouth 2 (two) times daily. With iron    . NOVOLIN 70/30 (70-30) 100 UNIT/ML injection Inject 10 Units into the skin 2 (two) times daily with a meal. 10 units two times a day    . ondansetron (ZOFRAN) 8 MG tablet Take 1 tablet (8 mg total) by mouth every 8 (eight) hours as needed for nausea or vomiting. 20 tablet 1  . Probiotic Product ([employer] PROBIOTIC PO) Take 1 capsule by mouth daily.    . Trastuzumab (HERCEPTIN IV) Inject into the vein. Infusion at [facility]    . Vortioxetine HBr (BRINTELLIX) 20 MG TABS Take 20 mg by mouth daily. 90 tablet 0   No current facility-administered medications for this visit.    OBJECTIVE:  Middle-aged white woman who appears stated age 65 Vitals:   01/24/15 1004  BP: 149/54  Pulse: 64  Temp: 98.1 F (36.7 C)  Resp: 18     Body mass index is 30.81 kg/(m^2).    ECOG FS:1 - Symptomatic but completely ambulatory  Sclerae unicteric, pupils round and equal Oropharynx clear and moist-- no thrush or other lesions No cervical or supraclavicular adenopathy Lungs no rales or  rhonchi Heart regular rate and rhythm Abd soft, nontender, positive bowel sounds MSK no focal spinal tenderness, no upper extremity lymphedema Neuro: nonfocal, well oriented, appropriate affect Breasts: the right breast is status post lumpectomy and sentinel lymph node sampling with Oncoplastic reconstruction.there is no evidence of local recurrence. The right axilla is benign. The left breast is status post reduction mammoplasty. The overall cosmetic result is good    LAB RESULTS:  CMP     Component Value Date/Time   NA 140 01/03/2015 0835   NA 139 01/02/2015 0959   K 4.0 01/03/2015 0835   K 3.9 01/02/2015 0959   CL 104 01/03/2015 0835   CO2 29 01/03/2015 0835   CO2 25 01/02/2015 0959   GLUCOSE 160* 01/03/2015 0835   GLUCOSE 192* 01/02/2015 0959   BUN 22* 01/03/2015 0835   BUN 23.5 01/02/2015 0959   CREATININE 1.46* 01/03/2015 0835   CREATININE 1.3* 01/02/2015 0959   CREATININE 1.10 02/18/2013 0827   CALCIUM 8.8* 01/03/2015 0835   CALCIUM 9.1 01/02/2015 0959   PROT 6.3* 01/02/2015 0959   PROT 6.0* 12/18/2014 1540   ALBUMIN 3.1* 01/02/2015 0959   ALBUMIN 3.6 12/18/2014 1540   AST 19 01/02/2015 0959   AST 21 12/18/2014 1540   ALT 17 01/02/2015 0959   ALT 22 12/18/2014 1540   ALKPHOS 77 01/02/2015 0959   ALKPHOS 73 12/18/2014 1540   BILITOT 0.68 01/02/2015 0959   BILITOT 1.0 12/18/2014 1540   GFRNONAA 37* 01/03/2015 0835   GFRNONAA 55* 02/18/2013 0827   GFRAA 43* 01/03/2015 0835   GFRAA 63 02/18/2013 0827    INo results found for: SPEP, UPEP  Lab Results  Component Value Date   WBC 6.9 01/24/2015   NEUTROABS 4.5 01/24/2015   HGB 8.6* 01/24/2015   HCT 26.5* 01/24/2015   MCV 88.3 01/24/2015   PLT 317 01/24/2015      Chemistry      Component Value Date/Time   NA 140 01/03/2015 0835   NA 139 01/02/2015 0959   K 4.0 01/03/2015 0835   K 3.9 01/02/2015 0959   CL 104 01/03/2015 0835   CO2 29 01/03/2015 0835   CO2 25 01/02/2015 0959   BUN 22* 01/03/2015 0835    BUN 23.5 01/02/2015 0959   CREATININE 1.46* 01/03/2015 0835   CREATININE 1.3* 01/02/2015 0959   CREATININE 1.10 02/18/2013 0827      Component Value Date/Time   CALCIUM 8.8* 01/03/2015 0835   CALCIUM 9.1 01/02/2015 0959   ALKPHOS 77 01/02/2015 0959   ALKPHOS 73 12/18/2014 1540   AST 19 01/02/2015 0959   AST 21 12/18/2014 1540   ALT 17 01/02/2015 0959   ALT 22 12/18/2014 1540   BILITOT 0.68 01/02/2015 0959   BILITOT 1.0 12/18/2014 1540       No results found for: LABCA2  No components found for: WTUUE280  No results for input(s): INR in the last 168 hours.  Urinalysis    Component Value Date/Time   COLORURINE YELLOW 12/18/2014 [place] 12/18/2014 1635   LABSPEC 1.023 12/18/2014 1635   PHURINE 5.0 12/18/2014 1635   GLUCOSEU NEGATIVE 12/18/2014 1635   HGBUR NEGATIVE 12/18/2014 1635   BILIRUBINUR NEGATIVE 12/18/2014 1635   BILIRUBINUR neg 06/29/2013 [place] 12/18/2014 1635   PROTEINUR 30* 12/18/2014 1635   PROTEINUR trace 06/29/2013 1017   UROBILINOGEN negative 06/29/2013 1017   NITRITE NEGATIVE 12/18/2014 1635   NITRITE neg 06/29/2013 1017   LEUKOCYTESUR NEGATIVE 12/18/2014 1635    STUDIES: Nm Sentinel Node Inj-no Rpt (breast)  12/27/2014  CLINICAL DATA: right breast cancer Sulfur colloid was injected intradermally by the nuclear medicine technologist for breast cancer sentinel node localization.    ASSESSMENT: 65 y.o. [place] woman status post right breast and right axillary lymph node biopsy 07/19/2014 for a cT2  pN1, stage IIB  invasive ductal carcinoma, grade 2, estrogen and progesterone receptor negative, with an  MIB-1 of 90%, and HER-2 amplified  (1) neoadjuvant chemotherapy started 08/08/2014, consisting of carboplatin, docetaxel, trastuzumab and pertuzumab given every 3 weeks 6, completed 11/21/2014  (a) breast MRI after initial 3 cycles shows a significant response  (b) breast MRI after 6 cycles shows a complete  radiologic response   (2) trastuzumab to be continued to complete one year (through mid-July 2017)  (a) echo 11/08/2014 shows an EF of 66%  (3) right lumpectomy with sentinel lymph node biopsy on 12/27/14 showed a residual  ypT2 ypN1a, stage IIB invasive ductal carcinoma, grade 2, with repeat estrogen and progesterone receptors again negative  (4) status post right oncoplastic surgery (with left reduction mammoplasty) 01/06/2015 showing in the right breast multiple foci of intralymphatic carcinoma in random sections  (5) further local treatment to be discussed at conference 01/24/2014  (6) considering adjuvant capecitabine    PLAN: I spent approximately one hour today with [name] and her husband going over her complex situation. She understands that in cases where we do not get the desired response from neoadjuvant chemotherapy the argument can go both ways. Many experts argue that further chemotherapy is unlikely to be helpful since the best chemotherapy was unable to eradicate the tumor completely. Others however suspect that perhaps more treatment would improve results and we have at least one randomized study now from [place] using capecitabine in this setting which does show a reduction in recurrence in patients receiving capecitabine after neoadjuvant treatment.  Darlene [name] is very interested in this possibility. We are also going to restage her, since she is at risk for metastatic disease, and she will have a chest CT scan and bone scan within the week.  As far as further local treatment is concerned it is difficult for me to see how we can avoid mastectomy on the right but this will be discussed at conference tomorrow. If she does have mastectomy I would also favor postmastectomy radiation to that area.  She will drop by my office tomorrow after her consult with Dr. Sondra Come and we will set up a follow-up appointment for her after her CT of the chest and bone scan have been scheduled . At that time  also we will discuss starting capecitabine.   Chauncey Cruel, MD   01/24/2015 10:23 AM

## 2015-01-25 ENCOUNTER — Ambulatory Visit
Admission: RE | Admit: 2015-01-25 | Discharge: 2015-01-25 | Disposition: A | Discharge status: Home / Self Care | Payer: BLUE CROSS/BLUE SHIELD | Source: Ambulatory Visit | Attending: Radiation Oncology | Admitting: Radiation Oncology

## 2015-01-25 ENCOUNTER — Other Ambulatory Visit: Payer: Self-pay | Admitting: Oncology

## 2015-01-25 ENCOUNTER — Other Ambulatory Visit: Payer: Self-pay | Admitting: General Surgery

## 2015-01-25 ENCOUNTER — Telehealth: Payer: Self-pay | Admitting: Oncology

## 2015-01-25 ENCOUNTER — Encounter: Payer: Self-pay | Admitting: Radiation Oncology

## 2015-01-25 VITALS — BP 148/60 | HR 50 | Temp 98.1°F | Resp 16 | Ht 69.0 in | Wt 208.2 lb

## 2015-01-25 DIAGNOSIS — F329 Major depressive disorder, single episode, unspecified: Secondary | ICD-10-CM | POA: Diagnosis not present

## 2015-01-25 DIAGNOSIS — F1010 Alcohol abuse, uncomplicated: Secondary | ICD-10-CM | POA: Insufficient documentation

## 2015-01-25 DIAGNOSIS — D649 Anemia, unspecified: Secondary | ICD-10-CM | POA: Diagnosis not present

## 2015-01-25 DIAGNOSIS — F419 Anxiety disorder, unspecified: Secondary | ICD-10-CM | POA: Diagnosis not present

## 2015-01-25 DIAGNOSIS — E669 Obesity, unspecified: Secondary | ICD-10-CM | POA: Insufficient documentation

## 2015-01-25 DIAGNOSIS — E785 Hyperlipidemia, unspecified: Secondary | ICD-10-CM | POA: Insufficient documentation

## 2015-01-25 DIAGNOSIS — C50411 Malignant neoplasm of upper-outer quadrant of right female breast: Secondary | ICD-10-CM | POA: Insufficient documentation

## 2015-01-25 DIAGNOSIS — I10 Essential (primary) hypertension: Secondary | ICD-10-CM | POA: Insufficient documentation

## 2015-01-25 DIAGNOSIS — R269 Unspecified abnormalities of gait and mobility: Secondary | ICD-10-CM | POA: Insufficient documentation

## 2015-01-25 DIAGNOSIS — Z510 Encounter for antineoplastic radiation therapy: Secondary | ICD-10-CM | POA: Insufficient documentation

## 2015-01-25 DIAGNOSIS — F509 Eating disorder, unspecified: Secondary | ICD-10-CM | POA: Diagnosis not present

## 2015-01-25 DIAGNOSIS — Z171 Estrogen receptor negative status [ER-]: Secondary | ICD-10-CM | POA: Diagnosis not present

## 2015-01-25 NOTE — Progress Notes (Signed)
Please see the Nurse Progress Note in the MD Initial Consult Encounter for this patient. 

## 2015-01-25 NOTE — Telephone Encounter (Signed)
Appointments made and avs printed for patient °

## 2015-01-25 NOTE — Progress Notes (Signed)
Radiation Oncology         [phone]) [phone] ________________________________  Re-evaluation note  Name: Darlene [name] MRN: [id number]  Date: 01/25/2015  DOB: 1950/09/23  IR:JJOACZYSA,YTKZS, MD  Magrinat, Virgie Dad, MD   REFERRING PHYSICIAN: Magrinat, Virgie Dad, MD  DIAGNOSIS: The encounter diagnosis was Breast cancer of upper-outer quadrant of right female breast ([place]). Stage IIB (yT2, N1a, cM0)   HISTORY OF PRESENT ILLNESS::Darlene [name] is a 65 y.o. female who was initially seen in the multidisciplinary breast clinic on 07/27/2014 by Dr. Valere Dross. She proceeded to undergo neoadjuvant treatment followed by surgery. Pathology of her right lumpectomy and sentinel lymph node sampling showed an area of 4 cm of residual invasive ductal carcinoma, grade 2, with very close though negative margins. Estrogen and progesterone receptors were negative. One out of 3 sentinel lymph nodes were positive, without extracapsular extension. On 01/06/2015 she underwent right breast onco plastic reconstruction and left breast reduction for symmetry. The pathology from this procedure showed multiple foci of intralymphatic carcinoma in random sections from the right mammoplasty.  PREVIOUS RADIATION THERAPY: No.  PAST MEDICAL HISTORY:  has a past medical history of Obesity; Hypertension; Hyperlipidemia; Diabetes mellitus without complication ([place]); Eating disorder; Balance problem; Arthritis; Anemia; Depression; Ovarian cyst rupture; Clumsiness; Breast cancer of upper-outer quadrant of right female breast ([place]) (07/22/2014); Breast cancer ([place]); Anxiety; Pneumonia; Neuromuscular disorder ([place]); Obstructive sleep apnea on CPAP; and Heart murmur.    PAST SURGICAL HISTORY: Past Surgical History  Procedure Laterality Date  . Carpal tunnel release Right   . Carpal tunnel release Left   . Breath tek h pylori N/A 09/15/2012    Procedure: BREATH TEK H PYLORI;  Surgeon: Pedro Earls, MD;  Location: [facility];   Service: General;  Laterality: N/A;  . Dupuytren contracture release    . Trigger finger release      x3  . Toe surgery  1970s    bone spur  . Rotator cuff repair Right 2005  . Gastric roux-en-y N/A 11/02/2012    Procedure: LAPAROSCOPIC ROUX-EN-Y GASTRIC;  Surgeon: Pedro Earls, MD;  [facility];  Service: General;  Laterality: N/A;  . Bunionectomy Left 12/2013  . Portacath placement Left 08/02/2014    Procedure: INSERTION PORT-A-CATH;  Surgeon: Stark Klein, MD;  Location: [place];  Service: General;  Laterality: Left;  . Breast lumpectomy with radioactive seed and sentinel lymph node biopsy Right 12/27/2014    Procedure: BREAST LUMPECTOMY WITH RADIOACTIVE SEED AND SENTINEL LYMPH NODE BIOPSY;  Surgeon: Stark Klein, MD;  Location: [place];  Service: General;  Laterality: Right;  . Breast reduction surgery Bilateral 01/06/2015    Procedure: BILATERAL BREAST REDUCTION, RIGHT ONCOPLASTIC RECONSTRUCTION),LEFT BREAST REDUCTION FOR SYMMETRY;  Surgeon: [name], MD;  Location: [place];  Service: Plastics;  Laterality: Bilateral;    FAMILY HISTORY: family history includes Breast cancer in her paternal grandmother; CVA in her mother; Diabetes in her brother, father, and paternal uncle; Heart failure in her father; Hypertension in her sister.  SOCIAL HISTORY:  reports that she has never smoked. She has never used smokeless tobacco. She reports that she drinks alcohol. She reports that she does not use illicit drugs.  ALLERGIES: Other and Tape  MEDICATIONS:  Current Outpatient Prescriptions  Medication Sig Dispense Refill  . acetaminophen (TYLENOL) 325 MG tablet Take 650 mg by mouth 2 (two) times daily as needed for moderate pain or headache.    . Calcium Citrate-Vitamin D (CALCIUM CITRATE+D3 PO) Take  1,800 mg by mouth daily.     . cholestyramine (QUESTRAN) 4 g packet Take by mouth.    . diphenhydrAMINE (BENADRYL) 25 MG tablet Take 25 mg by mouth  daily as needed for allergies or sleep.    . diphenoxylate-atropine (LOMOTIL) 2.5-0.025 MG tablet     . docusate sodium (COLACE) 50 MG capsule Take 50 mg by mouth as needed for mild constipation.    [name] glucose blood (ONETOUCH VERIO) test strip 1 each by Other route as needed for other. Use as instructed    . losartan (COZAAR) 100 MG tablet Take 1 tablet by mouth daily.     . Melatonin 5 MG TABS Take 5 mg by mouth at bedtime as needed (sleep).    . mometasone (NASONEX) 50 MCG/ACT nasal spray Place 2 sprays into the nose daily as needed (allergies).     . Multiple Vitamin (MULTIVITAMIN WITH MINERALS) TABS tablet Take 1 tablet by mouth 2 (two) times daily. With iron    . NOVOLIN 70/30 (70-30) 100 UNIT/ML injection Inject 10 Units into the skin 2 (two) times daily with a meal. 10 units two times a day    . ondansetron (ZOFRAN) 8 MG tablet Take 1 tablet (8 mg total) by mouth every 8 (eight) hours as needed for nausea or vomiting. 20 tablet 1  . Probiotic Product ([employer]S PROBIOTIC PO) Take 1 capsule by mouth daily.    . Trastuzumab (HERCEPTIN IV) Inject into the vein. Infusion at [facility]    . Vortioxetine HBr (TRINTELLIX) 20 MG TABS Take 20 mg by mouth daily.     No current facility-administered medications for this encounter.    REVIEW OF SYSTEMS:  A 15 point review of systems is documented in the electronic medical record. This was obtained by the nursing staff. However, I reviewed this with the patient to discuss relevant findings and make appropriate changes.  Pertinent items are noted in HPI.   PHYSICAL EXAM:  height is '5\' 9"'  (1.753 m) and weight is 208 lb 3.2 oz (94.439 kg). Her oral temperature is 98.1 F (36.7 C). Her blood pressure is 148/60 and her pulse is 50. Her respiration is 16.   No exam performed today,  .   ECOG = 1  LABORATORY DATA:  Lab Results  Component Value Date   WBC 6.9 01/24/2015   HGB 8.6* 01/24/2015   HCT 26.5* 01/24/2015   MCV 88.3 01/24/2015   PLT 317 01/24/2015    NEUTROABS 4.5 01/24/2015   Lab Results  Component Value Date   NA 144 01/24/2015   K 4.6 01/24/2015   CL 104 01/03/2015   CO2 28 01/24/2015   GLUCOSE 142* 01/24/2015   CREATININE 1.1 01/24/2015   CALCIUM 8.9 01/24/2015      RADIOGRAPHY: Nm Sentinel Node Inj-no Rpt (breast)  12/27/2014  CLINICAL DATA: right breast cancer Sulfur colloid was injected intradermally by the nuclear medicine technologist for breast cancer sentinel node localization.    IMPRESSION: Ms. Renda is a 65 yo woman with a history of right breast cancer, presenting with invasive ductal carcinoma of the right breast, grade 2, estrogen and progesterone receptor negative, HER-2 positive. She completed neoadjuvant treatment and was found to have significant residual tumor burden at the time of her breast conserving surgery. In addition within the reduction mammoplasty specimen the patient was noted to have  multiple foci of intralymphatic carcinoma in random sections . The patient's case was presented at the multidisciplinary breast conference earlier today. Given the  significant residual burden it was recommended patient proceed with the staging workup including bone scan and CT scans. In addition it was recommended patient proceed with a completion mastectomy and axillary node dissection. Patient will then likely proceed with adjuvant capecitabine. Given the significant residual disease I would also recommend postmastectomy irradiation therapy in this situation which was also the consensus at the conference this morning. Today I briefly discussed this treatment course side effects and potential toxicities and the patient seems receptive to this therapy after she receives additional chemotherapy. Patient will also likely receive reduced dose capecitabine during her chest wall radiation therapy.   PLAN: Patient will be seen again for further evaluation after completion of her surgery.       ------------------------------------------------  Blair Promise, PhD, MD    This document serves as a record of services personally performed by Gery Pray, MD. It was created on his behalf by Lendon Collar, a trained medical scribe. The creation of this record is based on the scribe's personal observations and the provider's statements to them. This document has been checked and approved by the attending provider.

## 2015-01-26 NOTE — Addendum Note (Signed)
Encounter addended by: Jacqulyn Liner, RN on: 01/26/2015  3:28 PM<BR>     Documentation filed: Charges VN

## 2015-01-31 ENCOUNTER — Encounter: Payer: Self-pay | Admitting: *Deleted

## 2015-01-31 NOTE — Progress Notes (Signed)
[place] Psychosocial Distress Screening Clinical Social Work  Clinical Social Work was referred by distress screening protocol.  The patient scored a 6 on the Psychosocial Distress Thermometer which indicates moderate distress. Clinical Social Worker contacted patient at home to assess for distress and other psychosocial needs.  Patient stated she was feeling anxious and overwhelmed by recent changes in her treatment plan and upcoming surgery.  CSW and patient discussed patients feelings, and CSW validated patients feelings and concerns.  CSW and patient also discussed support available at [facility].  Patient was recently re-matched with an Alight guide that aline with her new treatment plan.  CSW also encouraged/discussed the benefits of support group.  Patient reported some concern for her husband and need for caregiver support.  Patient reported that her husband plans to speak with Dr. Jana Hakim.  CSW informed patient of caregiver support available at [facility].  Patient plans to share information with her husband and will provide CSW contact information.  CSW encouraged patient to call with any needs or concerns.        ONCBCN DISTRESS SCREENING 01/25/2015  Screening Type Initial Screening  Distress experienced in past week (1-10) 6  Practical problem type   Family Problem type   Emotional problem type Depression;Nervousness/Anxiety;Adjusting to illness  Spiritual/Religous concerns type Facing my mortality  Information Concerns Type Lack of info about treatment  Physical Problem type Constipation/diarrhea  Physician notified of physical symptoms   Referral to clinical psychology   Referral to clinical social work   Referral to dietition   Referral to financial advocate   Referral to support programs   Referral to palliative care    Johnnye Lana, MSW, LCSW, OSW-C Clinical Social Worker [place] [phone]

## 2015-02-02 ENCOUNTER — Ambulatory Visit (HOSPITAL_COMMUNITY)
Admission: RE | Admit: 2015-02-02 | Discharge: 2015-02-02 | Disposition: A | Discharge status: Home / Self Care | Payer: BLUE CROSS/BLUE SHIELD | Source: Ambulatory Visit | Attending: Oncology | Admitting: Oncology

## 2015-02-02 ENCOUNTER — Ambulatory Visit (HOSPITAL_COMMUNITY): Payer: BLUE CROSS/BLUE SHIELD

## 2015-02-02 ENCOUNTER — Encounter (HOSPITAL_COMMUNITY): Payer: BLUE CROSS/BLUE SHIELD

## 2015-02-02 ENCOUNTER — Encounter (HOSPITAL_COMMUNITY)
Admission: RE | Admit: 2015-02-02 | Discharge: 2015-02-02 | Disposition: A | Discharge status: Home / Self Care | Payer: BLUE CROSS/BLUE SHIELD | Source: Ambulatory Visit | Attending: Oncology | Admitting: Oncology

## 2015-02-02 ENCOUNTER — Encounter (HOSPITAL_COMMUNITY): Payer: Self-pay

## 2015-02-02 DIAGNOSIS — R160 Hepatomegaly, not elsewhere classified: Secondary | ICD-10-CM | POA: Insufficient documentation

## 2015-02-02 DIAGNOSIS — R918 Other nonspecific abnormal finding of lung field: Secondary | ICD-10-CM | POA: Insufficient documentation

## 2015-02-02 DIAGNOSIS — C50411 Malignant neoplasm of upper-outer quadrant of right female breast: Secondary | ICD-10-CM

## 2015-02-02 DIAGNOSIS — I2510 Atherosclerotic heart disease of native coronary artery without angina pectoris: Secondary | ICD-10-CM | POA: Diagnosis not present

## 2015-02-02 DIAGNOSIS — Z9889 Other specified postprocedural states: Secondary | ICD-10-CM | POA: Insufficient documentation

## 2015-02-02 MED ORDER — TECHNETIUM TC 99M MEDRONATE IV KIT
26.0000 | PACK | Freq: Once | INTRAVENOUS | Status: AC | PRN
  Administered 2015-02-02: 26 via INTRAVENOUS

## 2015-02-02 MED ORDER — IOHEXOL 300 MG/ML  SOLN
75.0000 mL | Freq: Once | INTRAMUSCULAR | Status: AC | PRN
  Administered 2015-02-02: 75 mL via INTRAVENOUS

## 2015-02-06 ENCOUNTER — Telehealth: Payer: Self-pay | Admitting: Oncology

## 2015-02-06 ENCOUNTER — Ambulatory Visit (HOSPITAL_BASED_OUTPATIENT_CLINIC_OR_DEPARTMENT_OTHER): Payer: BLUE CROSS/BLUE SHIELD | Admitting: Oncology

## 2015-02-06 ENCOUNTER — Ambulatory Visit (HOSPITAL_COMMUNITY)
Admission: RE | Admit: 2015-02-06 | Discharge: 2015-02-06 | Disposition: A | Discharge status: Home / Self Care | Payer: BLUE CROSS/BLUE SHIELD | Source: Ambulatory Visit | Attending: Oncology | Admitting: Oncology

## 2015-02-06 VITALS — BP 142/45 | HR 53 | Temp 97.3°F | Resp 18 | Ht 69.0 in | Wt 207.4 lb

## 2015-02-06 DIAGNOSIS — C50411 Malignant neoplasm of upper-outer quadrant of right female breast: Secondary | ICD-10-CM

## 2015-02-06 DIAGNOSIS — E0821 Diabetes mellitus due to underlying condition with diabetic nephropathy: Secondary | ICD-10-CM

## 2015-02-06 DIAGNOSIS — Z794 Long term (current) use of insulin: Secondary | ICD-10-CM

## 2015-02-06 DIAGNOSIS — C773 Secondary and unspecified malignant neoplasm of axilla and upper limb lymph nodes: Secondary | ICD-10-CM | POA: Diagnosis not present

## 2015-02-06 DIAGNOSIS — D630 Anemia in neoplastic disease: Secondary | ICD-10-CM

## 2015-02-06 DIAGNOSIS — T451X5A Adverse effect of antineoplastic and immunosuppressive drugs, initial encounter: Secondary | ICD-10-CM

## 2015-02-06 DIAGNOSIS — Z171 Estrogen receptor negative status [ER-]: Secondary | ICD-10-CM | POA: Diagnosis not present

## 2015-02-06 DIAGNOSIS — G620 Drug-induced polyneuropathy: Secondary | ICD-10-CM

## 2015-02-06 DIAGNOSIS — E119 Type 2 diabetes mellitus without complications: Secondary | ICD-10-CM | POA: Insufficient documentation

## 2015-02-06 MED ORDER — CAPECITABINE 500 MG PO TABS: ORAL_TABLET | ORAL | Status: DC

## 2015-02-06 MED ORDER — GADOBENATE DIMEGLUMINE 529 MG/ML IV SOLN
20.0000 mL | Freq: Once | INTRAVENOUS | Status: AC | PRN
  Administered 2015-02-06: 19 mL via INTRAVENOUS

## 2015-02-06 NOTE — Telephone Encounter (Signed)
Appointments made and avs printed for patient °

## 2015-02-06 NOTE — Progress Notes (Signed)
Darlene [name]  Telephone:([phone]) [phone] Fax:(336) [phone]     ID: Darlene [name] DOB: 23-Mar-1950  MR#: [id number]  DJM#:[id number]  Patient Care Team: Thressa Sheller, MD as PCP - General (Internal Medicine) Bryson Ha Himmelrich, RD as Dietitian (Bariatrics) Stark Klein, MD as Consulting Physician (General Surgery) Chauncey Cruel, MD as Consulting Physician (Oncology) Arloa Koh, MD as Consulting Physician (Radiation Oncology) Mauro Kaufmann, RN as Registered Nurse Rockwell Germany, RN as Registered Nurse Jacelyn Pi, MD as Consulting Physician (Endocrinology) PCP: Thressa Sheller, MD GYN: Kem Boroughs FNP OTHER MD:  CHIEF COMPLAINT:  HER-2 positive, estrogen receptor negative breast cancer  CURRENT TREATMENT:  anti-HER-2 treatment; definitive surgery pending  BREAST CANCER HISTORY: From the original intake note:  "Darlene [name]" had screening mammography at her gynecologist suggestive of a possible abnormality in the right breast. However right diagnostic mammogram 04/12/2013 at [facility] was negative. More recently however, she noted a lump in her right breast and brought it to her gynecologist's attention. On 07/18/2014 the patient had bilateral diagnostic mammography with tomosynthesis at [facility]. The breast density was category B. In the right breast upper outer quadrant there was an area of focal asymmetry with indistinct margins.[name] Ultrasound was obtained and showed in addition to the mass in question, measuring 1.7 cm, an abnormal-appearing lymph node in the right axillary tail.  On 07/19/2014 the patient underwent biopsy of the right breast massin question as well as a right axillary lymph node. Both were positive for invasive ductal carcinoma, grade 2, estrogen and progesterone receptor negative, with an MIB-1 of 90%, and HER-2 pending. Incidentally the lymph node biopsy showed a lymphocytic inflammatory component but no lymph node architecture.  Her subsequent  history is as detailed below.  INTERVAL HISTORY:  Darlene [name] returns today for follow-up of her estrogen receptor negative breast cancer, accompanied by her husband Darlene [name].  To summarize recent developments, she had her definitive surgery which surprisingly showed a 4 cm area of residual viable cancer as well as  A positive axillary lymph node. She then proceeded to breast reduction which showed intralymphatic deposits of tumor in the material removed from the breast. Darlene [name]'s case was discussed at the multidisciplinary breast cancer conference 01/25/2015. At that time it was felt the only option on the right would be a right modified radical mastectomy , followed by postmastectomy radiation. Consideration of capecitabine adjuvantly was also discussed. Finally staging studies were recommended and these fortunately show no evidence of stage IV disease.  The only study left is a brain MRI scheduled for later today.  REVIEW OF SYSTEMS: Darlene [name] remains very anxious. She admits to anxiety but not depression. She does feel that her antidepressants are helping. She has mild sinus symptoms. She tells me her blood sugars have been well-controlled.Otherwise a detailed review of systems today was noncontributory  PAST MEDICAL HISTORY: Past Medical History  Diagnosis Date  . Obesity   . Hypertension   . Hyperlipidemia   . Diabetes mellitus without complication ([place])     22+ years  . Eating disorder     binge eating  . Balance problem   . Arthritis     in fingers, shoulders  . Anemia     hx of  . Depression   . Ovarian cyst rupture     possible  . Clumsiness   . Breast cancer of upper-outer quadrant of right female breast ([place]) 07/22/2014  . Breast cancer ([place])   . Anxiety   . Pneumonia   . Neuromuscular disorder (  [place])     diabetic neuropathy  . Obstructive sleep apnea on CPAP     uses cpap nightly  . Heart murmur     PAST SURGICAL HISTORY: Past Surgical History  Procedure Laterality Date  . Carpal  tunnel release Right   . Carpal tunnel release Left   . Breath tek h pylori N/A 09/15/2012    Procedure: BREATH TEK H PYLORI;  Surgeon: Pedro Earls, MD;  Location: [facility];  Service: General;  Laterality: N/A;  . Dupuytren contracture release    . Trigger finger release      x3  . Toe surgery  1970s    bone spur  . Rotator cuff repair Right 2005  . Gastric roux-en-y N/A 11/02/2012    Procedure: LAPAROSCOPIC ROUX-EN-Y GASTRIC;  Surgeon: Pedro Earls, MD;  Location: [facility];  Service: General;  Laterality: N/A;  . Bunionectomy Left 12/2013  . Portacath placement Left 08/02/2014    Procedure: INSERTION PORT-A-CATH;  Surgeon: Stark Klein, MD;  Location: [place];  Service: General;  Laterality: Left;  . Breast lumpectomy with radioactive seed and sentinel lymph node biopsy Right 12/27/2014    Procedure: BREAST LUMPECTOMY WITH RADIOACTIVE SEED AND SENTINEL LYMPH NODE BIOPSY;  Surgeon: Stark Klein, MD;  Location: [place];  Service: General;  Laterality: Right;  . Breast reduction surgery Bilateral 01/06/2015    Procedure: BILATERAL BREAST REDUCTION, RIGHT ONCOPLASTIC RECONSTRUCTION),LEFT BREAST REDUCTION FOR SYMMETRY;  Surgeon: [name], MD;  Location: [place];  Service: Plastics;  Laterality: Bilateral;    FAMILY HISTORY Family History  Problem Relation Age of Onset  . CVA Mother   . Diabetes Father   . Heart failure Father   . Hypertension Sister   . Diabetes Brother   . Diabetes Paternal Uncle   . Breast cancer Paternal Grandmother    the patient's father died from complications of diabetes at age 5. The patient's mother died at age 78 with a subarachnoid hemorrhage. The patient had one brother, one sister. The only breast cancer was the patient's paternal grandmother diagnosed in her 88s. There is no history of ovarian cancer in the family.  GYNECOLOGIC HISTORY:  Patient's last menstrual period was 10/22/2007.  menarche age 8,  the patient is GX P0. She went through the change of life in 2011. She did not take hormone replacement.  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 Her husband Darlene [name] is a retired Visual merchandiser (he taught at [employer]).  Darlene [name] has 2 children from an earlier marriage, Darlene [name] who teaches in [place], and Darlene [name],  who works at the [employer] in in [place]. He has 1 grand son, aged 25. The patient and her husband are not church attender's    ADVANCED DIRECTIVES:  Not in place   HEALTH MAINTENANCE: Social History  Substance Use Topics  . Smoking status: Never Smoker   . Smokeless tobacco: Never Used  . Alcohol Use: 0.0 oz/week    0 Standard drinks or equivalent per week     Comment: social     Colonoscopy:  May 2016  PAP:  Bone density: 07/05/2013 normal, with a T score of 0.4  Lipid panel:  Allergies  Allergen Reactions  . Other Itching    Peanuts in large quantities   . Tape Hives and Rash    Current Outpatient Prescriptions  Medication Sig Dispense Refill  . acetaminophen (TYLENOL)  325 MG tablet Take 650 mg by mouth 2 (two) times daily as needed for moderate pain or headache.    . Calcium Citrate-Vitamin D (CALCIUM CITRATE+D3 PO) Take 1,800 mg by mouth daily.     . cholestyramine (QUESTRAN) 4 g packet Take 4 g by mouth 2 (two) times daily. Reported on 02/01/2015    . diphenhydrAMINE (BENADRYL) 25 MG tablet Take 25 mg by mouth daily as needed for allergies or sleep.    . diphenoxylate-atropine (LOMOTIL) 2.5-0.025 MG tablet Take 1 tablet by mouth 4 (four) times daily as needed for diarrhea or loose stools.     . docusate sodium (COLACE) 50 MG capsule Take 50 mg by mouth as needed for mild constipation.    [name] glucose blood (ONETOUCH VERIO) test strip 1 each by Other route as needed for other. Use as instructed    . losartan (COZAAR) 100 MG tablet Take 1 tablet by mouth daily.     .  Melatonin 5 MG TABS Take 5 mg by mouth at bedtime.     . Multiple Vitamin (MULTIVITAMIN WITH MINERALS) TABS tablet Take 1 tablet by mouth 2 (two) times daily. With iron    . NOVOLIN 70/30 (70-30) 100 UNIT/ML injection Inject 10-12 Units into the skin 2 (two) times daily with a meal. 10 units two times a day    . ondansetron (ZOFRAN) 8 MG tablet Take 1 tablet (8 mg total) by mouth every 8 (eight) hours as needed for nausea or vomiting. (Patient taking differently: Take 8 mg by mouth daily. ) 20 tablet 1  . Probiotic Product ([employer] PROBIOTIC PO) Take 1 capsule by mouth daily.    . Trastuzumab (HERCEPTIN IV) Inject into the vein. Infusion at [facility]    . Vortioxetine HBr (TRINTELLIX) 20 MG TABS Take 20 mg by mouth daily.     No current facility-administered medications for this visit.    OBJECTIVE:  Middle-aged white woman in no acute distress Filed Vitals:   02/06/15 1046  BP: 142/45  Pulse: 53  Temp: 97.3 F (36.3 C)  Resp: 18     Body mass index is 30.61 kg/(m^2).    ECOG FS:1 - Symptomatic but completely ambulatory  Sclerae unicteric, EOMs intact Oropharynx clearand moist No cervical or supraclavicular adenopathy Lungs no rales or rhonchi Heart regular rate and rhythm Abd soft, nontender, positive bowel sounds MSK no focal spinal tenderness, no upper extremity lymphedema Neuro: nonfocal, well oriented, appropriate affect Breasts: the right breast is status post lumpectomy and then reduction mammoplasty. The incisions have healed nicely. There are no palpable masses. There are no skin or nipple changes of concern. The right axilla is benign. Left breast is status post reduction mammoplasty.  LAB RESULTS:  CMP     Component Value Date/Time   NA 144 01/24/2015 0954   NA 140 01/03/2015 0835   K 4.6 01/24/2015 0954   K 4.0 01/03/2015 0835   CL 104 01/03/2015 0835   CO2 28 01/24/2015 0954   CO2 29 01/03/2015 0835   GLUCOSE 142* 01/24/2015 0954   GLUCOSE 160* 01/03/2015 0835   BUN  17.9 01/24/2015 0954   BUN 22* 01/03/2015 0835   CREATININE 1.1 01/24/2015 0954   CREATININE 1.46* 01/03/2015 0835   CREATININE 1.10 02/18/2013 0827   CALCIUM 8.9 01/24/2015 0954   CALCIUM 8.8* 01/03/2015 0835   PROT 6.1* 01/24/2015 0954   PROT 6.0* 12/18/2014 1540   ALBUMIN 3.0* 01/24/2015 0954   ALBUMIN 3.6 12/18/2014 1540   AST  21 01/24/2015 0954   AST 21 12/18/2014 1540   ALT 23 01/24/2015 0954   ALT 22 12/18/2014 1540   ALKPHOS 90 01/24/2015 0954   ALKPHOS 73 12/18/2014 1540   BILITOT 0.47 01/24/2015 0954   BILITOT 1.0 12/18/2014 1540   GFRNONAA 37* 01/03/2015 0835   GFRNONAA 55* 02/18/2013 0827   GFRAA 43* 01/03/2015 0835   GFRAA 63 02/18/2013 0827    INo results found for: SPEP, UPEP  Lab Results  Component Value Date   WBC 6.9 01/24/2015   NEUTROABS 4.5 01/24/2015   HGB 8.6* 01/24/2015   HCT 26.5* 01/24/2015   MCV 88.3 01/24/2015   PLT 317 01/24/2015      Chemistry      Component Value Date/Time   NA 144 01/24/2015 0954   NA 140 01/03/2015 0835   K 4.6 01/24/2015 0954   K 4.0 01/03/2015 0835   CL 104 01/03/2015 0835   CO2 28 01/24/2015 0954   CO2 29 01/03/2015 0835   BUN 17.9 01/24/2015 0954   BUN 22* 01/03/2015 0835   CREATININE 1.1 01/24/2015 0954   CREATININE 1.46* 01/03/2015 0835   CREATININE 1.10 02/18/2013 0827      Component Value Date/Time   CALCIUM 8.9 01/24/2015 0954   CALCIUM 8.8* 01/03/2015 0835   ALKPHOS 90 01/24/2015 0954   ALKPHOS 73 12/18/2014 1540   AST 21 01/24/2015 0954   AST 21 12/18/2014 1540   ALT 23 01/24/2015 0954   ALT 22 12/18/2014 1540   BILITOT 0.47 01/24/2015 0954   BILITOT 1.0 12/18/2014 1540       No results found for: LABCA2  No components found for: LABCA125  No results for input(s): INR in the last 168 hours.  Urinalysis    Component Value Date/Time   COLORURINE YELLOW 12/18/2014 1635   APPEARANCEUR CLEAR 12/18/2014 1635   LABSPEC 1.023 12/18/2014 1635   PHURINE 5.0 12/18/2014 1635   GLUCOSEU  NEGATIVE 12/18/2014 1635   HGBUR NEGATIVE 12/18/2014 1635   BILIRUBINUR NEGATIVE 12/18/2014 1635   BILIRUBINUR neg 06/29/2013 1017   KETONESUR NEGATIVE 12/18/2014 1635   PROTEINUR 30* 12/18/2014 1635   PROTEINUR trace 06/29/2013 1017   UROBILINOGEN negative 06/29/2013 1017   NITRITE NEGATIVE 12/18/2014 1635   NITRITE neg 06/29/2013 1017   LEUKOCYTESUR NEGATIVE 12/18/2014 1635    STUDIES: Ct Chest W Contrast  02/02/2015  CLINICAL DATA:  Right breast cancer diagnosed in July with last chemotherapy in October. Right lumpectomy. Mastectomy planned. Staging. EXAM: CT CHEST WITH CONTRAST TECHNIQUE: Multidetector CT imaging of the chest was performed during intravenous contrast administration. CONTRAST:  44m OMNIPAQUE IOHEXOL 300 MG/ML  SOLN COMPARISON:  12/18/2014 plain film. Breast MR of 11/22/2014. No prior CT. Clinic note of 01/24/2015 reviewed. FINDINGS: Mediastinum/Nodes: No supraclavicular adenopathy. Tortuous descending thoracic aorta. Mild cardiomegaly, without pericardial effusion. Multivessel coronary artery atherosclerosis. No central pulmonary embolism, on this non-dedicated study. No mediastinal or hilar adenopathy. No internal mammary adenopathy. Lungs/Pleura: No pleural fluid. Mild scarring at the left lung base. Upper abdomen: Caudate and lateral segment left liver lobe enlargement. Mildly irregular hepatic capsule, including on image 59/series 2. Normal imaged portions of the spleen, pancreas, gallbladder. Left worse than right adrenal nodularity, nonspecific. Surgical changes, Roux-en-Y gastric bypass. Musculoskeletal: surgical changes in both breasts. No well-defined residual right breast mass. Right axillary well-defined fluid density structure measures 5.1 x 4.0 cm on image 12/ series 2. Multiple small left subpectoral nodes, including on images 10 and 11/series 2. IMPRESSION: 1. No typical findings  of metastatic disease within the chest. 2. Surgical changes within both breasts. A  right axillary fluid density lesion is likely a postoperative seroma or hematoma. Small left subpectoral nodes warrant followup attention. 3. Cardiomegaly. Age advanced coronary artery atherosclerosis. Recommend assessment of coronary risk factors and consideration of medical therapy. 4. Findings which are suspicious for mild cirrhosis. Correlate with risk factors. Electronically Signed   By: Abigail Miyamoto M.D.   On: 02/02/2015 09:09   Nm Bone Scan Whole Body  02/02/2015  CLINICAL DATA:  RIGHT breast cancer, no bone pain, staging EXAM: NUCLEAR MEDICINE WHOLE BODY BONE SCAN TECHNIQUE: Whole body anterior and posterior images were obtained approximately 3 hours after intravenous injection of radiopharmaceutical. RADIOPHARMACEUTICALS:  26 mCi Technetium-70mMDP IV COMPARISON:  None Radiographic correlation: CT chest 02/02/2015 FINDINGS: Uptake at the shoulders, hips, knees, and ankles/feet, typically degenerative. No foci of abnormal osseous tracer accumulation are identified which are suspicious for metastatic disease. Slightly increased trace localization within soft tissues at the RIGHT breast likely related to recent breast surgery, corresponding to infiltrate changes seen on CT. Expected urinary tract and soft tissue distribution of tracer otherwise seen. IMPRESSION: No scintigraphic evidence of osseous metastatic disease. Electronically Signed   By: MLavonia DanaM.D.   On: 02/02/2015 11:53    ASSESSMENT: 65y.o. [place] woman status post right breast and right axillary lymph node biopsy 07/19/2014 for a cT2  pN1, stage IIB  invasive ductal carcinoma, grade 2, estrogen and progesterone receptor negative, with an MIB-1 of 90%, and HER-2 amplified  (1) neoadjuvant chemotherapy started 08/08/2014, consisting of carboplatin, docetaxel, trastuzumab and pertuzumab given every 3 weeks 6, completed 11/21/2014  (a) breast MRI after initial 3 cycles shows a significant response  (b) breast MRI after 6 cycles  shows a complete radiologic response   (2) trastuzumab to be continued to complete one year (through mid-July 2017)  (a) echo 11/08/2014 shows an EF of 66%  (3) right lumpectomy with sentinel lymph node biopsy on 12/27/14 showed a residual  ypT2 ypN1a, stage IIB invasive ductal carcinoma, grade 2, with repeat estrogen and progesterone receptors again negative  (4) status post right oncoplastic surgery (with left reduction mammoplasty) 01/06/2015 showing in the right breast multiple foci of intralymphatic carcinoma in random sections  (5)  Right modified radical mastectomy scheduled for 02/15/2015  (6)  Postmastectomy radiation to follow   (7) considering adjuvant capecitabine    PLAN: It is very favorable that the bone scan and CT scans show no evidence of stage IV disease.  LVaughan Bastaremains at high risk for local recurrence and she will have her right modified radical mastectomy January 25 under Dr. BBarry Dienes She will see me 10-14 days later and the plan will be for her to have postmastectomy radiation under Dr. KClabe Sealdirection.  Today we discussed capecitabine. She understands this can be used as a radiosensitizer, which can make the radiation treatments more effective. It can also increase the side effects from the radiation treatments.  The dose of capecitabine given during radiation is lower, and generally does not cause side effects, but she understands that capecitabine can cause mouth sores, diarrhea, and palmar plantar erythrodysesthesia, in addition to nausea, vomiting, and low blood counts.  Once she finishes the radiation we can start full dose capecitabine which we would continue for 3 months.  At this point she is interested in proceeding with this plan. We'll discuss it further at the next visit. I'm going ahead and entering her orders now so  that we can confirm she will be able to obtain these medications at a reasonable cost.  Today I also spoke briefly with Maydell's  husband Darlene [name]. He is very supportive and has significant experience from his prior work history on dealing with complex situations like this one. Today best of my ability to tell he is doing an optimal job.  They know to call for any problems that may develop before her next visit here.  Chauncey Cruel, MD   02/06/2015 4:27 PM

## 2015-02-07 ENCOUNTER — Encounter (HOSPITAL_COMMUNITY)
Admission: RE | Admit: 2015-02-07 | Discharge: 2015-02-07 | Disposition: A | Discharge status: Home / Self Care | Payer: BLUE CROSS/BLUE SHIELD | Source: Ambulatory Visit | Attending: General Surgery | Admitting: General Surgery

## 2015-02-07 ENCOUNTER — Telehealth: Payer: Self-pay

## 2015-02-07 DIAGNOSIS — E785 Hyperlipidemia, unspecified: Secondary | ICD-10-CM | POA: Insufficient documentation

## 2015-02-07 DIAGNOSIS — Z79899 Other long term (current) drug therapy: Secondary | ICD-10-CM | POA: Diagnosis not present

## 2015-02-07 DIAGNOSIS — E119 Type 2 diabetes mellitus without complications: Secondary | ICD-10-CM | POA: Diagnosis not present

## 2015-02-07 DIAGNOSIS — C50911 Malignant neoplasm of unspecified site of right female breast: Secondary | ICD-10-CM | POA: Insufficient documentation

## 2015-02-07 DIAGNOSIS — D649 Anemia, unspecified: Secondary | ICD-10-CM | POA: Diagnosis not present

## 2015-02-07 DIAGNOSIS — Z01812 Encounter for preprocedural laboratory examination: Secondary | ICD-10-CM | POA: Diagnosis not present

## 2015-02-07 DIAGNOSIS — G4733 Obstructive sleep apnea (adult) (pediatric): Secondary | ICD-10-CM | POA: Diagnosis not present

## 2015-02-07 DIAGNOSIS — Z01818 Encounter for other preprocedural examination: Secondary | ICD-10-CM | POA: Diagnosis present

## 2015-02-07 DIAGNOSIS — Z794 Long term (current) use of insulin: Secondary | ICD-10-CM | POA: Insufficient documentation

## 2015-02-07 DIAGNOSIS — I10 Essential (primary) hypertension: Secondary | ICD-10-CM | POA: Diagnosis not present

## 2015-02-07 LAB — BASIC METABOLIC PANEL
Anion gap: 10 (ref 5–15)
BUN: 28 mg/dL — ABNORMAL HIGH (ref 6–20)
CO2: 22 mmol/L (ref 22–32)
Calcium: 9.5 mg/dL (ref 8.9–10.3)
Chloride: 109 mmol/L (ref 101–111)
Creatinine, Ser: 1.17 mg/dL — ABNORMAL HIGH (ref 0.44–1.00)
GFR calc Af Amer: 57 mL/min — ABNORMAL LOW (ref >60)
GFR calc non Af Amer: 49 mL/min — ABNORMAL LOW (ref >60)
Glucose, Bld: 222 mg/dL — ABNORMAL HIGH (ref 65–99)
Potassium: 4.5 mmol/L (ref 3.5–5.1)
Sodium: 141 mmol/L (ref 135–145)

## 2015-02-07 LAB — CBC
HCT: 31 % — ABNORMAL LOW (ref 36.0–46.0)
Hemoglobin: 9.9 g/dL — ABNORMAL LOW (ref 12.0–15.0)
MCH: 28.6 pg (ref 26.0–34.0)
MCHC: 31.9 g/dL (ref 30.0–36.0)
MCV: 89.6 fL (ref 78.0–100.0)
Platelets: 203 10*3/uL (ref 150–400)
RBC: 3.46 MIL/uL — ABNORMAL LOW (ref 3.87–5.11)
RDW: 15.6 % — ABNORMAL HIGH (ref 11.5–15.5)
WBC: 6.3 10*3/uL (ref 4.0–10.5)

## 2015-02-07 LAB — GLUCOSE, CAPILLARY: Glucose-Capillary: 175 mg/dL — ABNORMAL HIGH (ref 65–99)

## 2015-02-07 NOTE — Pre-Procedure Instructions (Signed)
Darlene [name]  02/07/2015      [facility] - [place], [place] [place] [address] Phone: [phone] Fax: [phone]    Your procedure is scheduled on Wednesday   02/15/15  Report to [facility] Admitting at 1200 PM  Call this number if you have problems the morning of surgery:  [phone]   Remember:  Do not eat food or drink liquids after midnight.  Take these medicines the morning of surgery with A SIP OF WATER    TYLENOL, ZOFRAN (STOP MULTIVITAMIN, HERBAL MEDICINES, NO ASPIRIN, COUMADIN, PLAVIX, EFFIENT) How to Manage Your Diabetes Before Surgery   Why is it important to control my blood sugar before and after surgery?   Improving blood sugar levels before and after surgery helps healing and can limit problems.  A way of improving blood sugar control is eating a healthy diet by:  - Eating less sugar and carbohydrates  - Increasing activity/exercise  - Talk with your doctor about reaching your blood sugar goals  High blood sugars (greater than 180 mg/dL) can raise your risk of infections and slow down your recovery so you will need to focus on controlling your diabetes during the weeks before surgery.  Make sure that the doctor who takes care of your diabetes knows about your planned surgery including the date and location.  How do I manage my blood sugars before surgery?   Check your blood sugar at least 4 times a day, 2 days before surgery to make sure that they are not too high or low.   Check your blood sugar the morning of your surgery when you wake up and every 2               hours until you get to the Short-Stay unit.  If your blood sugar is less than 70 mg/dL, you will need to treat for low blood sugar by:  Treat a low blood sugar (less than 70 mg/dL) with 1/2 cup of clear juice (cranberry or apple), 4 glucose tablets, OR glucose gel.  Recheck blood sugar in 15 minutes  after treatment (to make sure it is greater than 70 mg/dL).  If blood sugar is not greater than 70 mg/dL on re-check, call [phone] for further instructions.   Report your blood sugar to the Short-Stay nurse when you get to Short-Stay.  References:  [facility], 2007 "How to Manage your Diabetes Before and After Surgery".  What do I do about my diabetes medications?   Do not take oral diabetes medicines (pills) the morning of surgery.  THE NIGHT BEFORE SURGERY, take 7  units of NOVOLIN 70/30 Insulin.    THE MORNING OF SURGERY, take 0  units of  Insulin.    Do not take other diabetes injectables the day of surgery including Byetta, Victoza, Bydureon, and Trulicity.    If your CBG is greater than 220 mg/dL, you may take 1/2 of your sliding scale (correction) dose of insulin.   For patients with "Insulin Pumps":  Contact your diabetes doctor for specific instructions before surgery.   Decrease basal insulin rates by 20% at midnight the night before surgery.  Note that if your surgery is planned to be longer than 2 hours, your insulin pump will be removed and intravenous (IV) insulin will be started and managed by the nurses and anesthesiologist.  You will be able to restart your insulin pump  once you are awake and able to manage it.  Make sure to bring insulin pump supplies to the hospital with you in case your site needs to be changed.        Do not wear jewelry, make-up or nail polish.  Do not wear lotions, powders, or perfumes.  You may wear deodorant.  Do not shave 48 hours prior to surgery.  Men may shave face and neck.  Do not bring valuables to the hospital.  [facility] is not responsible for any belongings or valuables.  Contacts, dentures or bridgework may not be worn into surgery.  Leave your suitcase in the car.  After surgery it may be brought to your room.  For patients admitted to the hospital, discharge time will be  determined by your treatment team.  Patients discharged the day of surgery will not be allowed to drive home.   Name and phone number of your driver:   Special instructions:  [name] - Preparing for Surgery  Before surgery, you can play an important role.  Because skin is not sterile, your skin needs to be as free of germs as possible.  You can reduce the number of germs on you skin by washing with CHG (chlorahexidine gluconate) soap before surgery.  CHG is an antiseptic cleaner which kills germs and bonds with the skin to continue killing germs even after washing.  Please DO NOT use if you have an allergy to CHG or antibacterial soaps.  If your skin becomes reddened/irritated stop using the CHG and inform your nurse when you arrive at Short Stay.  Do not shave (including legs and underarms) for at least 48 hours prior to the first CHG shower.  You may shave your face.  Please follow these instructions carefully:   1.  Shower with CHG Soap the night before surgery and the                                morning of Surgery.  2.  If you choose to wash your hair, wash your hair first as usual with your       normal shampoo.  3.  After you shampoo, rinse your hair and body thoroughly to remove the                      Shampoo.  4.  Use CHG as you would any other liquid soap.  You can apply chg directly       to the skin and wash gently with scrungie or a clean washcloth.  5.  Apply the CHG Soap to your body ONLY FROM THE NECK DOWN.        Do not use on open wounds or open sores.  Avoid contact with your eyes,       ears, mouth and genitals (private parts).  Wash genitals (private parts)       with your normal soap.  6.  Wash thoroughly, paying special attention to the area where your surgery        will be performed.  7.  Thoroughly rinse your body with warm water from the neck down.  8.  DO NOT shower/wash with your normal soap after using and rinsing off       the CHG Soap.  9.  Pat yourself  dry with a clean towel.  10.  Wear clean pajamas.            11.  Place clean sheets on your bed the night of your first shower and do not        sleep with pets.  Day of Surgery  Do not apply any lotions/deoderants the morning of surgery.  Please wear clean clothes to the hospital/surgery center.    Please read over the following fact sheets that you were given. Pain Booklet, Coughing and Deep Breathing and Surgical Site Infection Prevention

## 2015-02-07 NOTE — Telephone Encounter (Signed)
Called patient per Dr. Jana Hakim to let her know that her MRI of the brain showed no signs of cancer.  LVM for patient on her private line.  Patient called back and stated understanding of test results.

## 2015-02-08 ENCOUNTER — Encounter: Payer: Self-pay | Admitting: Neurology

## 2015-02-08 ENCOUNTER — Ambulatory Visit (INDEPENDENT_AMBULATORY_CARE_PROVIDER_SITE_OTHER): Payer: BLUE CROSS/BLUE SHIELD | Admitting: Neurology

## 2015-02-08 VITALS — BP 150/64 | HR 62 | Resp 20 | Ht 69.0 in | Wt 202.0 lb

## 2015-02-08 DIAGNOSIS — Z9989 Dependence on other enabling machines and devices: Secondary | ICD-10-CM

## 2015-02-08 DIAGNOSIS — G4733 Obstructive sleep apnea (adult) (pediatric): Secondary | ICD-10-CM | POA: Diagnosis not present

## 2015-02-08 DIAGNOSIS — G620 Drug-induced polyneuropathy: Secondary | ICD-10-CM | POA: Diagnosis not present

## 2015-02-08 DIAGNOSIS — T451X5A Adverse effect of antineoplastic and immunosuppressive drugs, initial encounter: Secondary | ICD-10-CM
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
## 2015-02-08 NOTE — Progress Notes (Signed)
Anesthesia Chart Review:  Pt is 65 year old female scheduled for R modified radical mastectomy on 02/15/2015 with Dr. Barry Dienes.   PMH includes:  HTN, DM, hyperlipidemia, anemia, OSA (on CPAP), heart murmur, breast cancer. Never smoker. BMI 30. S/p B breast reduction, R breast reconstruction, L breast reduction 01/06/15. S/p breast lumpectomy 12/27/14. S/p port-a-cath insertion 08/02/14. S/p laparoscopic roux-en-y 11/02/12.  Pt hospitalized 11/27-11/30/16 for HCAP. Tx with levaquin.   Medications include: losartan, novolin  Preoperative labs reviewed. Glucose 222. HgbA1c 6.5 on 01/03/15. H/H 9.9/31. This is consistent with prior results dating back to 09/2014. Will order T&S for DOS.   Chest x-ray 12/18/14 reviewed. Streaky L basilar retrocardiac atelectasis or infiltrate. No pulmonary edema.   EKG 01/03/15: Sinus bradycardia (54 bpm) with 1st degree A-V block. Anteroseptal infarct, age undetermined. No significant change since prior tracing 09/16/12 per Dr. Lysbeth Penner interpretation.   Echo 11/08/14:  - Left ventricle: The cavity size was normal. There was moderate concentric hypertrophy. The estimated ejection fraction was 66%. GLPSS is low normal at -17%. Wall motion was normal; there were no regional wall motion abnormalities. Doppler parameters are consistent with abnormal left ventricular relaxation (grade 1 diastolic dysfunction). The E/e&' ratio is between 8-15, suggesting indeterminate LV filling presure. - Aortic valve: Trileaflet. Sclerosis without stenosis. There was trivial regurgitation. - Impressions: Compared to the prior study in 07/2014, there are no significant changes.  If no changes, I anticipate pt can proceed with surgery as scheduled.    Darlene Cass, FNP-BC [facility] Stay [facility]/Anesthesiology Phone: [phone] 02/08/2015 10:34 AM

## 2015-02-10 ENCOUNTER — Telehealth: Payer: Self-pay | Admitting: *Deleted

## 2015-02-10 ENCOUNTER — Other Ambulatory Visit: Payer: Self-pay | Admitting: Oncology

## 2015-02-10 DIAGNOSIS — C50411 Malignant neoplasm of upper-outer quadrant of right female breast: Secondary | ICD-10-CM

## 2015-02-10 MED ORDER — CAPECITABINE 500 MG PO TABS: ORAL_TABLET | ORAL | Status: DC

## 2015-02-10 NOTE — Telephone Encounter (Signed)
Pt called to ensure medication was sent to correct pharmacy.  Informed pt that script was [facility].

## 2015-02-13 ENCOUNTER — Ambulatory Visit (HOSPITAL_BASED_OUTPATIENT_CLINIC_OR_DEPARTMENT_OTHER): Payer: BLUE CROSS/BLUE SHIELD

## 2015-02-13 ENCOUNTER — Other Ambulatory Visit (HOSPITAL_BASED_OUTPATIENT_CLINIC_OR_DEPARTMENT_OTHER): Payer: BLUE CROSS/BLUE SHIELD

## 2015-02-13 ENCOUNTER — Encounter (HOSPITAL_COMMUNITY): Payer: Self-pay

## 2015-02-13 ENCOUNTER — Other Ambulatory Visit (HOSPITAL_COMMUNITY): Payer: Self-pay | Admitting: Cardiology

## 2015-02-13 ENCOUNTER — Ambulatory Visit (HOSPITAL_BASED_OUTPATIENT_CLINIC_OR_DEPARTMENT_OTHER)
Admission: RE | Admit: 2015-02-13 | Discharge: 2015-02-13 | Disposition: A | Discharge status: Home / Self Care | Payer: BLUE CROSS/BLUE SHIELD | Source: Ambulatory Visit | Attending: Cardiology | Admitting: Cardiology

## 2015-02-13 ENCOUNTER — Other Ambulatory Visit: Payer: BLUE CROSS/BLUE SHIELD

## 2015-02-13 ENCOUNTER — Ambulatory Visit: Payer: BLUE CROSS/BLUE SHIELD | Admitting: Oncology

## 2015-02-13 ENCOUNTER — Ambulatory Visit (HOSPITAL_COMMUNITY)
Admission: RE | Admit: 2015-02-13 | Discharge: 2015-02-13 | Disposition: A | Discharge status: Home / Self Care | Payer: BLUE CROSS/BLUE SHIELD | Source: Ambulatory Visit | Attending: Cardiology | Admitting: Cardiology

## 2015-02-13 VITALS — BP 151/71 | HR 56 | Temp 97.6°F | Resp 16

## 2015-02-13 VITALS — BP 124/68 | HR 60 | Wt 205.8 lb

## 2015-02-13 DIAGNOSIS — Z9221 Personal history of antineoplastic chemotherapy: Secondary | ICD-10-CM | POA: Insufficient documentation

## 2015-02-13 DIAGNOSIS — Z79899 Other long term (current) drug therapy: Secondary | ICD-10-CM | POA: Insufficient documentation

## 2015-02-13 DIAGNOSIS — E669 Obesity, unspecified: Secondary | ICD-10-CM | POA: Insufficient documentation

## 2015-02-13 DIAGNOSIS — Z95828 Presence of other vascular implants and grafts: Secondary | ICD-10-CM

## 2015-02-13 DIAGNOSIS — C50411 Malignant neoplasm of upper-outer quadrant of right female breast: Secondary | ICD-10-CM

## 2015-02-13 DIAGNOSIS — I10 Essential (primary) hypertension: Secondary | ICD-10-CM | POA: Insufficient documentation

## 2015-02-13 DIAGNOSIS — C50911 Malignant neoplasm of unspecified site of right female breast: Secondary | ICD-10-CM | POA: Insufficient documentation

## 2015-02-13 DIAGNOSIS — Z794 Long term (current) use of insulin: Secondary | ICD-10-CM | POA: Insufficient documentation

## 2015-02-13 DIAGNOSIS — Z5112 Encounter for antineoplastic immunotherapy: Secondary | ICD-10-CM

## 2015-02-13 DIAGNOSIS — E785 Hyperlipidemia, unspecified: Secondary | ICD-10-CM | POA: Insufficient documentation

## 2015-02-13 DIAGNOSIS — E1140 Type 2 diabetes mellitus with diabetic neuropathy, unspecified: Secondary | ICD-10-CM | POA: Insufficient documentation

## 2015-02-13 DIAGNOSIS — G4733 Obstructive sleep apnea (adult) (pediatric): Secondary | ICD-10-CM | POA: Insufficient documentation

## 2015-02-13 LAB — COMPREHENSIVE METABOLIC PANEL
ALT: 51 U/L (ref 0–55)
AST: 43 U/L — ABNORMAL HIGH (ref 5–34)
Albumin: 3.5 g/dL (ref 3.5–5.0)
Alkaline Phosphatase: 82 U/L (ref 40–150)
Anion Gap: 8 mEq/L (ref 3–11)
BUN: 35.8 mg/dL — ABNORMAL HIGH (ref 7.0–26.0)
CO2: 25 mEq/L (ref 22–29)
Calcium: 9 mg/dL (ref 8.4–10.4)
Chloride: 109 mEq/L (ref 98–109)
Creatinine: 1.2 mg/dL — ABNORMAL HIGH (ref 0.6–1.1)
EGFR: 47 mL/min/{1.73_m2} — ABNORMAL LOW (ref >90)
Glucose: 97 mg/dl (ref 70–140)
Potassium: 4.4 mEq/L (ref 3.5–5.1)
Sodium: 141 mEq/L (ref 136–145)
Total Bilirubin: 0.56 mg/dL (ref 0.20–1.20)
Total Protein: 6.3 g/dL — ABNORMAL LOW (ref 6.4–8.3)

## 2015-02-13 LAB — CBC WITH DIFFERENTIAL/PLATELET
BASO%: 0.2 % (ref 0.0–2.0)
Basophils Absolute: 0 10*3/uL (ref 0.0–0.1)
EOS%: 2.8 % (ref 0.0–7.0)
Eosinophils Absolute: 0.2 10*3/uL (ref 0.0–0.5)
HCT: 28.8 % — ABNORMAL LOW (ref 34.8–46.6)
HGB: 9.3 g/dL — ABNORMAL LOW (ref 11.6–15.9)
LYMPH%: 37 % (ref 14.0–49.7)
MCH: 28.3 pg (ref 25.1–34.0)
MCHC: 32.3 g/dL (ref 31.5–36.0)
MCV: 87.5 fL (ref 79.5–101.0)
MONO#: 0.3 10*3/uL (ref 0.1–0.9)
MONO%: 5.1 % (ref 0.0–14.0)
NEUT#: 3.4 10*3/uL (ref 1.5–6.5)
NEUT%: 54.9 % (ref 38.4–76.8)
Platelets: 180 10*3/uL (ref 145–400)
RBC: 3.29 10*6/uL — ABNORMAL LOW (ref 3.70–5.45)
RDW: 15.6 % — ABNORMAL HIGH (ref 11.2–14.5)
WBC: 6.1 10*3/uL (ref 3.9–10.3)
lymph#: 2.3 10*3/uL (ref 0.9–3.3)

## 2015-02-13 MED ORDER — SODIUM CHLORIDE 0.9 % IV SOLN
Freq: Once | INTRAVENOUS | Status: AC
  Administered 2015-02-13: 16:00:00 via INTRAVENOUS

## 2015-02-13 MED ORDER — HEPARIN SOD (PORK) LOCK FLUSH 100 UNIT/ML IV SOLN
500.0000 [IU] | Freq: Once | INTRAVENOUS | Status: AC | PRN
  Administered 2015-02-13: 500 [IU]
  Filled 2015-02-13: qty 5

## 2015-02-13 MED ORDER — SODIUM CHLORIDE 0.9 % IJ SOLN
10.0000 mL | INTRAMUSCULAR | Status: DC | PRN
  Administered 2015-02-13 (×2): 10 mL via INTRAVENOUS
  Filled 2015-02-13: qty 10

## 2015-02-13 MED ORDER — ACETAMINOPHEN 325 MG PO TABS
650.0000 mg | ORAL_TABLET | Freq: Once | ORAL | Status: AC
  Administered 2015-02-13: 650 mg via ORAL

## 2015-02-13 MED ORDER — SODIUM CHLORIDE 0.9 % IJ SOLN
10.0000 mL | INTRAMUSCULAR | Status: DC | PRN
  Filled 2015-02-13: qty 10

## 2015-02-13 MED ORDER — DIPHENHYDRAMINE HCL 25 MG PO CAPS
ORAL_CAPSULE | ORAL | Status: AC
  Filled 2015-02-13: qty 1

## 2015-02-13 MED ORDER — DIPHENHYDRAMINE HCL 25 MG PO CAPS
25.0000 mg | ORAL_CAPSULE | Freq: Once | ORAL | Status: AC
  Administered 2015-02-13: 25 mg via ORAL

## 2015-02-13 MED ORDER — TRASTUZUMAB CHEMO INJECTION 440 MG
6.0000 mg/kg | Freq: Once | INTRAVENOUS | Status: AC
  Administered 2015-02-13: 567 mg via INTRAVENOUS
  Filled 2015-02-13: qty 27

## 2015-02-13 MED ORDER — ACETAMINOPHEN 325 MG PO TABS
ORAL_TABLET | ORAL | Status: AC
  Filled 2015-02-13: qty 2

## 2015-02-13 NOTE — Progress Notes (Signed)
  Echocardiogram 2D Echocardiogram has been performed.  GREGORY, ANGELA 02/13/2015, 9:59 AM

## 2015-02-13 NOTE — Patient Instructions (Signed)
[facility] Discharge Instructions for Patients Receiving Chemotherapy  Today you received the following chemotherapy agents: Herceptin   To help prevent nausea and vomiting after your treatment, we encourage you to take your nausea medication as directed.    If you develop nausea and vomiting that is not controlled by your nausea medication, call the clinic.   BELOW ARE SYMPTOMS THAT SHOULD BE REPORTED IMMEDIATELY:  *FEVER GREATER THAN 100.5 F  *CHILLS WITH OR WITHOUT FEVER  NAUSEA AND VOMITING THAT IS NOT CONTROLLED WITH YOUR NAUSEA MEDICATION  *UNUSUAL SHORTNESS OF BREATH  *UNUSUAL BRUISING OR BLEEDING  TENDERNESS IN MOUTH AND THROAT WITH OR WITHOUT PRESENCE OF ULCERS  *URINARY PROBLEMS  *BOWEL PROBLEMS  UNUSUAL RASH Items with * indicate a potential emergency and should be followed up as soon as possible.  Feel free to call the clinic you have any questions or concerns. The clinic phone number is [phone].  Please show the CHEMO ALERT CARD at check-in to the Emergency Department and triage nurse.   

## 2015-02-13 NOTE — Progress Notes (Signed)
Patient ID: Darlene [name], female   DOB: 25-Oct-1950, 65 y.o.   MRN: [id number] Referring Physician:Dr Fullerton  Patient Care Team: Thressa Sheller, MD as PCP - General (Internal Medicine) Bryson Ha Himmelrich, RD as Dietitian (Bariatrics) Stark Klein, MD as Consulting Physician (General Surgery) Chauncey Cruel, MD as Consulting Physician (Oncology) Arloa Koh, MD as Consulting Physician (Radiation Oncology) Mauro Kaufmann, RN as Registered Nurse Rockwell Germany, RN as Registered Nurse Jacelyn Pi, MD as Consulting Physician (Endocrinology) PCP: Thressa Sheller, MD GYN: Kem Boroughs FNP   HPI: Darlene [name] is a 65 year old with history of R breast cancer, right axillary lymph node biopsy 07/19/2014 for a cT2 pN1, stage IIB invasive ductal carcinoma, grade 2, estrogen and progesterone receptor negative, with an MIB-1 of 90%, and HER-2 positive , OSA -> CPAP, bariatric surgery, and DM, referred to cardio-oncology clinic by Dr Jana Hakim  Completed neoadjuvant chemotherapy will consist of carboplatin, docetaxel, trastuzumab and pertuzumab given every 3 weeks 6.  Trastuzumab to be continued to complete one year in 7/16.  She had lumpectomy 12/16 and will need mastectomy, to be done 02/15/15.  Adjuvant radiation to follow surgery  Today she denies SOB/CP.  Doing ok currently.  Had PNA back in 12/16.   Echo (7/16) with EF 60-65%, grade II diastolic dysfunction.  Echo (10/16) with EF 66%, GLS -17%. Echo (1/17) with EF 60-65%, grade II diastolic dysfunction, lateral s' 10, GLS -18.8%  Review of Systems: All systems reviewed and negative except as per HPI.   Past Medical History  Diagnosis Date  . Obesity   . Hypertension   . Hyperlipidemia   . Diabetes mellitus without complication ([place])     64+ years  . Eating disorder     binge eating  . Balance problem   . Arthritis     in fingers, shoulders  . Anemia     hx of  . Depression   . Ovarian cyst rupture     possible  .  Clumsiness   . Breast cancer of upper-outer quadrant of right female breast ([place]) 07/22/2014  . Breast cancer ([place])   . Anxiety   . Pneumonia   . Neuromuscular disorder ([place])     diabetic neuropathy  . Obstructive sleep apnea on CPAP     uses cpap nightly  . Heart murmur     Current Outpatient Prescriptions  Medication Sig Dispense Refill  . acetaminophen (TYLENOL) 325 MG tablet Take 650 mg by mouth 2 (two) times daily as needed for moderate pain or headache.    . Calcium Citrate-Vitamin D (CALCIUM CITRATE+D3 PO) Take 1,800 mg by mouth daily.     . diphenhydrAMINE (BENADRYL) 25 MG tablet Take 25 mg by mouth daily as needed for allergies or sleep.    [name] docusate sodium (COLACE) 50 MG capsule Take 50 mg by mouth as needed for mild constipation.    [name] glucose blood (ONETOUCH VERIO) test strip 1 each by Other route as needed for other. Use as instructed    . losartan (COZAAR) 100 MG tablet Take 1 tablet by mouth daily.     . Melatonin 5 MG TABS Take 5 mg by mouth at bedtime.     . Multiple Vitamin (MULTIVITAMIN WITH MINERALS) TABS tablet Take 1 tablet by mouth 2 (two) times daily. With iron    . NOVOLIN 70/30 (70-30) 100 UNIT/ML injection Inject 10-12 Units into the skin 2 (two) times daily with a meal. 10 units two times a day    .  ondansetron (ZOFRAN) 8 MG tablet Take 8 mg by mouth daily.    . Probiotic Product ([employer] PROBIOTIC PO) Take 1 capsule by mouth daily.    . Trastuzumab (HERCEPTIN IV) Inject into the vein. Infusion at [facility]    . Vortioxetine HBr (TRINTELLIX) 20 MG TABS Take 20 mg by mouth daily.    . capecitabine (XELODA) 500 MG tablet Take 2 tablets by mouth after a meal 2 times a day on radiation days; do not take on days when you do not receive radiation (Patient not taking: Reported on 02/13/2015) 150 tablet 4  . cholestyramine (QUESTRAN) 4 g packet Take 4 g by mouth 2 (two) times daily. Reported on 02/13/2015    . diphenoxylate-atropine (LOMOTIL) 2.5-0.025 MG tablet Take 1 tablet by  mouth 4 (four) times daily as needed for diarrhea or loose stools. Reported on 02/13/2015     No current facility-administered medications for this encounter.    Allergies  Allergen Reactions  . Other Itching    Peanuts in large quantities   . Tape Hives and Rash      Social History   Social History  . Marital Status: Married    Spouse Name: Darlene [name]  . Number of Children: 0  . Years of Education: Bachelor's   Occupational History  . Theatre stage manager    Social History Main Topics  . Smoking status: Never Smoker   . Smokeless tobacco: Never Used  . Alcohol Use: 0.0 oz/week    0 Standard drinks or equivalent per week     Comment: social  . Drug Use: No  . Sexual Activity: Yes    Birth Control/ Protection: Post-menopausal   Other Topics Concern  . Not on file   Social History Narrative   Patient is married Darlene [name]) and lives at home with her husband.   Patient does not have any children.   Patient is working for a [employer].   Patient has a Bachelor's degree.   Patient is right-handed.   Patient does not drink any caffeine.      Family History  Problem Relation Age of Onset  . CVA Mother   . Diabetes Father   . Heart failure Father   . Hypertension Sister   . Diabetes Brother   . Diabetes Paternal Uncle   . Breast cancer Paternal Grandmother     Filed Vitals:   02/13/15 1018  BP: 124/68  Pulse: 60  Weight: 205 lb 12 oz (93.328 kg)  SpO2: 98%    PHYSICAL EXAM: General:  Well appearing. No respiratory difficulty HEENT: normal Neck: supple. no JVD. Carotids 2+ bilat; no bruits. No lymphadenopathy or thryomegaly appreciated. Cor: PMI nondisplaced. Regular rate & rhythm. No rubs, gallops or murmurs. Lungs: clear Abdomen: soft, nontender, nondistended. No hepatosplenomegaly. No bruits or masses. Good bowel sounds. Extremities: no cyanosis, clubbing, rash, edema Neuro: alert & oriented x 3, cranial nerves grossly intact. moves all 4 extremities  w/o difficulty. Affect pleasant.  ASSESSMENT & PLAN:  1. Breast cancer: I reviewed today's echo.  EF remains normal with stable global longitudinal strain.  No evidence for trastuzumab-related cardio-toxicity.  She will followup in 3 months with repeat echo.  2. HTN: Continue current regimen.  3. OSA: Continue nightly CPAP   Loralie Champagne 02/13/2015

## 2015-02-13 NOTE — Patient Instructions (Signed)
Your physician recommends that you schedule a follow-up appointment in: 3 months with echocardiogram  

## 2015-02-13 NOTE — Progress Notes (Signed)
Advanced Heart Failure Medication Review by a Pharmacist  Does the patient  feel that his/her medications are working for him/her?  yes  Has the patient been experiencing any side effects to the medications prescribed?  no  Does the patient measure his/her own blood pressure or blood glucose at home?  yes   Does the patient have any problems obtaining medications due to transportation or finances?   no  Understanding of regimen: good Understanding of indications: good Potential of compliance: good Patient understands to avoid NSAIDs. Patient understands to avoid decongestants.  Issues to address at subsequent visits: None   Pharmacist comments:  Darlene [name] is a pleasant 65 yo F presenting without a medication list but with good recall of her regimen including dosages. She reports excellent compliance with her regimen and did not have any specific medication-related questions or concerns for me at this time.   Darlene [name], PharmD, BCPS, CPP Clinical Pharmacist Pager: [phone] Phone: [phone] 02/13/2015 10:31 AM      Time with patient: 8 minutes Preparation and documentation time: 2 minutes Total time: 10 minutes

## 2015-02-14 ENCOUNTER — Ambulatory Visit: Payer: BLUE CROSS/BLUE SHIELD

## 2015-02-14 MED ORDER — CEFAZOLIN SODIUM-DEXTROSE 2-3 GM-% IV SOLR
2.0000 g | INTRAVENOUS | Status: AC
  Administered 2015-02-15: 2 g via INTRAVENOUS
  Filled 2015-02-14: qty 50

## 2015-02-14 NOTE — H&P (Signed)
Darlene [name] 01/27/2015 9:03 AM Location: [facility] Patient #: [id number] DOB: 1950/09/16 Married / Language: English / Race: White Female   History of Present Illness Darlene Klein MD; 01/29/2015 11:32 PM) The patient is a 65 year old female who presents with breast cancer. Prior history [place] is a 65 yo F referred by Dr. Thressa Sheller for consultation regarding a new right breast cancer. The patient presented with a palpable mass on the right. She underwent diagnostic imaging and was found to have a 1.7 cm mass at 10 o'clock and a axillary lymph node with loss of fatty hilum. She subsequently underwent core needle biopsy of the breast mass and lymph node. Both were positive for grade 2-3 invasive ductal carcinoma (breast wtih DCIS, mammary type), ER/PR negative, her 2 overexpressed. Ki 67 was 90%. Patient had a paternal grandmother with breast cancer at age 24. Also, her nephew had total colectomy for cancer and "many many polyps." She is nulliparous. She had menarche at age 24. She had menopause at age 81. She did use hormonal contraception for around 6 years, but has not had HRT. She is up to date with her colonoscopy and pap smear. ]  Patient finished chemotherapy, and she had no additional palpable mass and MRI looked like complete response. She underwent lumpectomy with SLN bx 12/26/2104. We discussed her pathology at breast conference, and we determined she did not need additional surgery. She subsequently underwent mammoplasty and was seen to have intralymphatic emboli in randomly submitted sections. We reviewed these new findings at conference and had a unanimous recommendation for completion mastectomy and axillary lymph node dissection. She presents for discussion. she is doing well overall. She is still taking some analgesics.    Medication History Patsey Berthold, CMA; 01/27/2015 9:03 AM) Nasonex (50MCG/ACT Suspension, Nasal) Active. HumuLIN 70/30  Pen ((70-30) 100UNIT/ML Susp Pen-inj, Subcutaneous) Active. Brintellix (10MG  Tablet, Oral daily) Active. AmLODIPine Besylate (2.5MG  Tablet, Oral at bedtime) Active. Flax Seed Oil (1000MG  Capsule, Oral daily) Active. Tobramycin-Dexamethasone (0.3-0.1% Suspension, Ophthalmic) Active. Diphenoxylate-Atropine (2.5-0.025MG  Tablet, Oral) Active. Calcium Carbonate Active. Cholestyramine Active. Dexamethasone (4MG  Tablet, Oral) Active. Lidocaine HCl (3% Cream, External) Active. LORazepam (0.5MG  Tablet, Oral) Active. Multiple Vitamins (Oral) Active. NovoLIN 70/30 ((70-30) 100UNIT/ML Suspension, Subcutaneous) Active. Zofran (8MG  Tablet, Oral) Active. Probiotic Product (Oral) Active. Compazine (10MG  Tablet, Oral) Active. Vortioxetine HBr (20MG  Tablet, Oral) Active. Docusate Sodium (100MG  Capsule, Oral daily) Active. Vitamin B12 (3000MCG/ML Liquid, Sublingual every other day) Active. Medications Reconciled    Review of Systems Darlene Klein MD; 01/29/2015 11:32 PM) All other systems negative  Vitals Darlene Klein MD; 01/29/2015 11:31 PM) 01/29/2015 11:30 PM Weight: 205.7 lb Height: 69in Body Surface Area: 2.09 m Body Mass Index: 30.38 kg/m  Temp.: 98.73F  Pulse: 70 (Regular)  Resp.: 16 (Unlabored)  P.OX: 100% (Room air) BP: 154/49 (Sitting, Left Arm, Standard)       Physical Exam Darlene Klein MD; 01/29/2015 11:33 PM) General Mental Status-Alert. General Appearance-Consistent with stated age. Hydration-Well hydrated. Voice-Normal.  Chest and Lung Exam Chest and lung exam reveals -quiet, even and easy respiratory effort with no use of accessory muscles. Inspection Chest Wall - Normal. Back - normal.  Breast Note: reduction looks symmetric. no evidence of cellulitis. no palpable masses.     Assessment & Plan Darlene Klein MD; 01/29/2015 11:35 PM) PRIMARY CANCER OF UPPER OUTER QUADRANT OF RIGHT FEMALE BREAST (C50.411) Impression: We will  plan completion mastectomy and ALND.  We reviewed surgery, risks, benefits. We also discussed future reconstruction. [name]  Bleeding, he losatisfaction with the scar,horacodorsal nerve, dissatisfaction with the scar,horacodorsal nerve, dissatisfaction with the scar, heart or lung problems.  She understands and wishes to proceed.  We will get this on at the first available opportunity.    Signed by Darlene Klein, MD (01/29/2015 11:35 PM)

## 2015-02-15 ENCOUNTER — Other Ambulatory Visit: Payer: Self-pay | Admitting: *Deleted

## 2015-02-15 ENCOUNTER — Encounter (HOSPITAL_COMMUNITY): Payer: Self-pay | Admitting: Anesthesiology

## 2015-02-15 ENCOUNTER — Ambulatory Visit (HOSPITAL_COMMUNITY)
Admission: RE | Admit: 2015-02-15 | Discharge: 2015-02-16 | Disposition: A | Discharge status: Home / Self Care | Payer: BLUE CROSS/BLUE SHIELD | Source: Ambulatory Visit | Attending: General Surgery | Admitting: General Surgery

## 2015-02-15 ENCOUNTER — Encounter (HOSPITAL_COMMUNITY)
Admission: RE | Disposition: A | Discharge status: Home / Self Care | Payer: Self-pay | Source: Ambulatory Visit | Attending: General Surgery

## 2015-02-15 ENCOUNTER — Ambulatory Visit (HOSPITAL_COMMUNITY): Payer: BLUE CROSS/BLUE SHIELD | Admitting: Anesthesiology

## 2015-02-15 ENCOUNTER — Ambulatory Visit (HOSPITAL_COMMUNITY): Payer: BLUE CROSS/BLUE SHIELD | Admitting: Emergency Medicine

## 2015-02-15 DIAGNOSIS — F329 Major depressive disorder, single episode, unspecified: Secondary | ICD-10-CM | POA: Diagnosis not present

## 2015-02-15 DIAGNOSIS — Z803 Family history of malignant neoplasm of breast: Secondary | ICD-10-CM | POA: Diagnosis not present

## 2015-02-15 DIAGNOSIS — Z79899 Other long term (current) drug therapy: Secondary | ICD-10-CM | POA: Diagnosis not present

## 2015-02-15 DIAGNOSIS — Z7951 Long term (current) use of inhaled steroids: Secondary | ICD-10-CM | POA: Insufficient documentation

## 2015-02-15 DIAGNOSIS — C773 Secondary and unspecified malignant neoplasm of axilla and upper limb lymph nodes: Secondary | ICD-10-CM | POA: Diagnosis not present

## 2015-02-15 DIAGNOSIS — G709 Myoneural disorder, unspecified: Secondary | ICD-10-CM | POA: Diagnosis not present

## 2015-02-15 DIAGNOSIS — E119 Type 2 diabetes mellitus without complications: Secondary | ICD-10-CM | POA: Insufficient documentation

## 2015-02-15 DIAGNOSIS — G4730 Sleep apnea, unspecified: Secondary | ICD-10-CM | POA: Diagnosis not present

## 2015-02-15 DIAGNOSIS — Z6830 Body mass index (BMI) 30.0-30.9, adult: Secondary | ICD-10-CM | POA: Diagnosis not present

## 2015-02-15 DIAGNOSIS — E668 Other obesity: Secondary | ICD-10-CM | POA: Insufficient documentation

## 2015-02-15 DIAGNOSIS — Z7952 Long term (current) use of systemic steroids: Secondary | ICD-10-CM | POA: Diagnosis not present

## 2015-02-15 DIAGNOSIS — M1990 Unspecified osteoarthritis, unspecified site: Secondary | ICD-10-CM | POA: Insufficient documentation

## 2015-02-15 DIAGNOSIS — I10 Essential (primary) hypertension: Secondary | ICD-10-CM | POA: Insufficient documentation

## 2015-02-15 DIAGNOSIS — D0511 Intraductal carcinoma in situ of right breast: Secondary | ICD-10-CM | POA: Insufficient documentation

## 2015-02-15 DIAGNOSIS — Z794 Long term (current) use of insulin: Secondary | ICD-10-CM | POA: Diagnosis not present

## 2015-02-15 DIAGNOSIS — Z853 Personal history of malignant neoplasm of breast: Secondary | ICD-10-CM | POA: Insufficient documentation

## 2015-02-15 DIAGNOSIS — Z9989 Dependence on other enabling machines and devices: Secondary | ICD-10-CM | POA: Insufficient documentation

## 2015-02-15 DIAGNOSIS — C50911 Malignant neoplasm of unspecified site of right female breast: Secondary | ICD-10-CM | POA: Diagnosis present

## 2015-02-15 DIAGNOSIS — Z9221 Personal history of antineoplastic chemotherapy: Secondary | ICD-10-CM | POA: Insufficient documentation

## 2015-02-15 HISTORY — PX: MASTECTOMY: SHX3

## 2015-02-15 HISTORY — PX: MODIFIED MASTECTOMY: SHX5268

## 2015-02-15 LAB — GLUCOSE, CAPILLARY
Glucose-Capillary: 105 mg/dL — ABNORMAL HIGH (ref 65–99)
Glucose-Capillary: 128 mg/dL — ABNORMAL HIGH (ref 65–99)

## 2015-02-15 LAB — TYPE AND SCREEN
ABO/RH(D): AB POS
Antibody Screen: NEGATIVE

## 2015-02-15 SURGERY — MODIFIED MASTECTOMY
Anesthesia: General | Site: Breast | Laterality: Right

## 2015-02-15 MED ORDER — LIDOCAINE HCL (CARDIAC) 20 MG/ML IV SOLN
INTRAVENOUS | Status: DC | PRN
  Administered 2015-02-15: 80 mg via INTRAVENOUS

## 2015-02-15 MED ORDER — DOCUSATE SODIUM 50 MG PO CAPS
50.0000 mg | ORAL_CAPSULE | Freq: Every day | ORAL | Status: DC | PRN
  Filled 2015-02-15: qty 1

## 2015-02-15 MED ORDER — ACETAMINOPHEN 650 MG RE SUPP: 650.0000 mg | Freq: Four times a day (QID) | RECTAL | Status: DC | PRN

## 2015-02-15 MED ORDER — HYDROMORPHONE HCL 1 MG/ML IJ SOLN
INTRAMUSCULAR | Status: AC
  Filled 2015-02-15: qty 1

## 2015-02-15 MED ORDER — ACETAMINOPHEN 325 MG PO TABS: 650.0000 mg | ORAL_TABLET | Freq: Two times a day (BID) | ORAL | Status: DC | PRN

## 2015-02-15 MED ORDER — LOSARTAN POTASSIUM 50 MG PO TABS
100.0000 mg | ORAL_TABLET | Freq: Every day | ORAL | Status: DC
  Administered 2015-02-15: 100 mg via ORAL
  Filled 2015-02-15 (×2): qty 2

## 2015-02-15 MED ORDER — FENTANYL CITRATE (PF) 100 MCG/2ML IJ SOLN
INTRAMUSCULAR | Status: DC | PRN
  Administered 2015-02-15: 25 ug via INTRAVENOUS
  Administered 2015-02-15 (×3): 50 ug via INTRAVENOUS
  Administered 2015-02-15: 25 ug via INTRAVENOUS
  Administered 2015-02-15: 50 ug via INTRAVENOUS

## 2015-02-15 MED ORDER — MIDAZOLAM HCL 5 MG/5ML IJ SOLN
INTRAMUSCULAR | Status: DC | PRN
  Administered 2015-02-15: 2 mg via INTRAVENOUS

## 2015-02-15 MED ORDER — INSULIN ASPART PROT & ASPART (70-30 MIX) 100 UNIT/ML ~~LOC~~ SUSP
10.0000 [IU] | Freq: Two times a day (BID) | SUBCUTANEOUS | Status: DC
  Administered 2015-02-16: 10 [IU] via SUBCUTANEOUS
  Filled 2015-02-15: qty 10

## 2015-02-15 MED ORDER — MEPERIDINE HCL 25 MG/ML IJ SOLN: 6.2500 mg | INTRAMUSCULAR | Status: DC | PRN

## 2015-02-15 MED ORDER — EPHEDRINE SULFATE 50 MG/ML IJ SOLN
INTRAMUSCULAR | Status: DC | PRN
Start: 2015-02-15 — End: 2015-02-15
  Administered 2015-02-15: 15 mg via INTRAVENOUS
  Administered 2015-02-15: 10 mg via INTRAVENOUS

## 2015-02-15 MED ORDER — ADULT MULTIVITAMIN W/MINERALS CH
1.0000 | ORAL_TABLET | Freq: Two times a day (BID) | ORAL | Status: DC
  Administered 2015-02-15 – 2015-02-16 (×2): 1 via ORAL
  Filled 2015-02-15 (×2): qty 1

## 2015-02-15 MED ORDER — ROCURONIUM BROMIDE 50 MG/5ML IV SOLN
INTRAVENOUS | Status: AC
  Filled 2015-02-15: qty 1

## 2015-02-15 MED ORDER — MIDAZOLAM HCL 2 MG/2ML IJ SOLN
INTRAMUSCULAR | Status: AC
  Filled 2015-02-15: qty 2

## 2015-02-15 MED ORDER — SIMETHICONE 80 MG PO CHEW: 40.0000 mg | CHEWABLE_TABLET | Freq: Four times a day (QID) | ORAL | Status: DC | PRN

## 2015-02-15 MED ORDER — ONDANSETRON 4 MG PO TBDP: 4.0000 mg | ORAL_TABLET | Freq: Four times a day (QID) | ORAL | Status: DC | PRN

## 2015-02-15 MED ORDER — KCL IN DEXTROSE-NACL 20-5-0.45 MEQ/L-%-% IV SOLN
INTRAVENOUS | Status: DC
  Administered 2015-02-15: 21:00:00 via INTRAVENOUS
  Filled 2015-02-15: qty 1000

## 2015-02-15 MED ORDER — DIPHENHYDRAMINE HCL 25 MG PO TABS: 25.0000 mg | ORAL_TABLET | Freq: Every day | ORAL | Status: DC | PRN

## 2015-02-15 MED ORDER — HYDROMORPHONE HCL 1 MG/ML IJ SOLN
0.2500 mg | INTRAMUSCULAR | Status: DC | PRN
  Administered 2015-02-15 (×5): 0.5 mg via INTRAVENOUS

## 2015-02-15 MED ORDER — METHOCARBAMOL 500 MG PO TABS
500.0000 mg | ORAL_TABLET | Freq: Four times a day (QID) | ORAL | Status: DC | PRN
  Administered 2015-02-15 – 2015-02-16 (×2): 500 mg via ORAL
  Filled 2015-02-15 (×3): qty 1

## 2015-02-15 MED ORDER — HYDRALAZINE HCL 20 MG/ML IJ SOLN: 10.0000 mg | INTRAMUSCULAR | Status: DC | PRN

## 2015-02-15 MED ORDER — OXYCODONE HCL 5 MG PO TABS
5.0000 mg | ORAL_TABLET | ORAL | Status: DC | PRN
  Administered 2015-02-15 – 2015-02-16 (×2): 10 mg via ORAL
  Filled 2015-02-15 (×2): qty 2

## 2015-02-15 MED ORDER — ROCURONIUM BROMIDE 100 MG/10ML IV SOLN
INTRAVENOUS | Status: DC | PRN
  Administered 2015-02-15 (×2): 20 mg via INTRAVENOUS
  Administered 2015-02-15: 30 mg via INTRAVENOUS

## 2015-02-15 MED ORDER — CEFAZOLIN SODIUM-DEXTROSE 2-3 GM-% IV SOLR
2.0000 g | Freq: Three times a day (TID) | INTRAVENOUS | Status: AC
  Administered 2015-02-15: 2 g via INTRAVENOUS
  Filled 2015-02-15: qty 50

## 2015-02-15 MED ORDER — PROPOFOL 10 MG/ML IV BOLUS
INTRAVENOUS | Status: DC | PRN
  Administered 2015-02-15: 120 mg via INTRAVENOUS

## 2015-02-15 MED ORDER — DIPHENOXYLATE-ATROPINE 2.5-0.025 MG PO TABS: 1.0000 | ORAL_TABLET | Freq: Four times a day (QID) | ORAL | Status: DC | PRN

## 2015-02-15 MED ORDER — DIPHENHYDRAMINE HCL 12.5 MG/5ML PO ELIX: 12.5000 mg | ORAL_SOLUTION | Freq: Four times a day (QID) | ORAL | Status: DC | PRN

## 2015-02-15 MED ORDER — VORTIOXETINE HBR 20 MG PO TABS
20.0000 mg | ORAL_TABLET | Freq: Every day | ORAL | Status: DC
  Filled 2015-02-15: qty 20

## 2015-02-15 MED ORDER — CHOLESTYRAMINE 4 G PO PACK
4.0000 g | PACK | Freq: Two times a day (BID) | ORAL | Status: DC
  Filled 2015-02-15 (×3): qty 1

## 2015-02-15 MED ORDER — GLYCOPYRROLATE 0.2 MG/ML IJ SOLN
INTRAMUSCULAR | Status: DC | PRN
  Administered 2015-02-15: 0.2 mg via INTRAVENOUS

## 2015-02-15 MED ORDER — DIPHENHYDRAMINE HCL 50 MG/ML IJ SOLN: 12.5000 mg | Freq: Four times a day (QID) | INTRAMUSCULAR | Status: DC | PRN

## 2015-02-15 MED ORDER — METOCLOPRAMIDE HCL 5 MG/ML IJ SOLN: 10.0000 mg | Freq: Once | INTRAMUSCULAR | Status: DC | PRN

## 2015-02-15 MED ORDER — SUGAMMADEX SODIUM 500 MG/5ML IV SOLN
INTRAVENOUS | Status: AC
  Filled 2015-02-15: qty 5

## 2015-02-15 MED ORDER — MENTHOL 3 MG MT LOZG
1.0000 | LOZENGE | OROMUCOSAL | Status: DC | PRN
  Filled 2015-02-15: qty 9

## 2015-02-15 MED ORDER — FENTANYL CITRATE (PF) 250 MCG/5ML IJ SOLN
INTRAMUSCULAR | Status: AC
  Filled 2015-02-15: qty 5

## 2015-02-15 MED ORDER — PHENYLEPHRINE 40 MCG/ML (10ML) SYRINGE FOR IV PUSH (FOR BLOOD PRESSURE SUPPORT)
PREFILLED_SYRINGE | INTRAVENOUS | Status: AC
  Filled 2015-02-15: qty 10

## 2015-02-15 MED ORDER — SUGAMMADEX SODIUM 500 MG/5ML IV SOLN
INTRAVENOUS | Status: DC | PRN
  Administered 2015-02-15: 200 mg via INTRAVENOUS

## 2015-02-15 MED ORDER — ONDANSETRON HCL 4 MG/2ML IJ SOLN
INTRAMUSCULAR | Status: DC | PRN
  Administered 2015-02-15: 4 mg via INTRAVENOUS

## 2015-02-15 MED ORDER — PROPOFOL 10 MG/ML IV BOLUS
INTRAVENOUS | Status: AC
  Filled 2015-02-15: qty 20

## 2015-02-15 MED ORDER — MORPHINE SULFATE (PF) 2 MG/ML IV SOLN
1.0000 mg | INTRAVENOUS | Status: DC | PRN
  Administered 2015-02-16: 2 mg via INTRAVENOUS
  Filled 2015-02-15: qty 1

## 2015-02-15 MED ORDER — BUPIVACAINE-EPINEPHRINE (PF) 0.5% -1:200000 IJ SOLN
INTRAMUSCULAR | Status: DC | PRN
  Administered 2015-02-15: 30 mL via PERINEURAL

## 2015-02-15 MED ORDER — FENTANYL CITRATE (PF) 100 MCG/2ML IJ SOLN
INTRAMUSCULAR | Status: AC
  Administered 2015-02-15: 50 ug
  Filled 2015-02-15: qty 2

## 2015-02-15 MED ORDER — LACTATED RINGERS IV SOLN
INTRAVENOUS | Status: DC
  Administered 2015-02-15 (×2): via INTRAVENOUS

## 2015-02-15 MED ORDER — ACETAMINOPHEN 325 MG PO TABS: 650.0000 mg | ORAL_TABLET | Freq: Four times a day (QID) | ORAL | Status: DC | PRN

## 2015-02-15 MED ORDER — 0.9 % SODIUM CHLORIDE (POUR BTL) OPTIME
TOPICAL | Status: DC | PRN
  Administered 2015-02-15 (×2): 1000 mL

## 2015-02-15 MED ORDER — ONDANSETRON HCL 4 MG PO TABS: 8.0000 mg | ORAL_TABLET | Freq: Every day | ORAL | Status: DC

## 2015-02-15 MED ORDER — ONDANSETRON HCL 4 MG/2ML IJ SOLN: 4.0000 mg | Freq: Four times a day (QID) | INTRAMUSCULAR | Status: DC | PRN

## 2015-02-15 MED ORDER — MELATONIN 5 MG PO TABS: 5.0000 mg | ORAL_TABLET | Freq: Every day | ORAL | Status: DC

## 2015-02-15 MED ORDER — MIDAZOLAM HCL 2 MG/2ML IJ SOLN
INTRAMUSCULAR | Status: AC
  Administered 2015-02-15: 2 mg
  Filled 2015-02-15: qty 2

## 2015-02-15 MED ORDER — PHENYLEPHRINE HCL 10 MG/ML IJ SOLN
INTRAMUSCULAR | Status: DC | PRN
  Administered 2015-02-15: 160 ug via INTRAVENOUS
  Administered 2015-02-15: 80 ug via INTRAVENOUS

## 2015-02-15 SURGICAL SUPPLY — 60 items
ATCH SMKEVC FLXB CAUT HNDSWH (FILTER) IMPLANT
BINDER BREAST LRG (GAUZE/BANDAGES/DRESSINGS) IMPLANT
BINDER BREAST XLRG (GAUZE/BANDAGES/DRESSINGS) ×2 IMPLANT
BNDG COHESIVE 4X5 TAN STRL (GAUZE/BANDAGES/DRESSINGS) ×2 IMPLANT
CANISTER SUCTION 2500CC (MISCELLANEOUS) ×2 IMPLANT
CHLORAPREP W/TINT 26ML (MISCELLANEOUS) ×2 IMPLANT
CLIP TI MEDIUM 24 (CLIP) ×2 IMPLANT
CLIP TI MEDIUM 6 (CLIP) ×2 IMPLANT
CLIP TI MEDIUM LARGE 6 (CLIP) ×2 IMPLANT
CLIP TI WIDE RED SMALL 6 (CLIP) ×2 IMPLANT
COVER SURGICAL LIGHT HANDLE (MISCELLANEOUS) ×2 IMPLANT
DERMABOND ADVANCED (GAUZE/BANDAGES/DRESSINGS) ×1
DERMABOND ADVANCED .7 DNX12 (GAUZE/BANDAGES/DRESSINGS) ×1 IMPLANT
DRAIN CHANNEL 19F RND (DRAIN) ×4 IMPLANT
DRAPE UTILITY XL STRL (DRAPES) ×4 IMPLANT
DRSG PAD ABDOMINAL 8X10 ST (GAUZE/BANDAGES/DRESSINGS) ×2 IMPLANT
ELECT BLADE 4.0 EZ CLEAN MEGAD (MISCELLANEOUS) ×2
ELECT CAUTERY BLADE 6.4 (BLADE) ×2 IMPLANT
ELECT REM PT RETURN 9FT ADLT (ELECTROSURGICAL) ×2
ELECTRODE BLDE 4.0 EZ CLN MEGD (MISCELLANEOUS) ×1 IMPLANT
ELECTRODE REM PT RTRN 9FT ADLT (ELECTROSURGICAL) ×1 IMPLANT
EVACUATOR SILICONE 100CC (DRAIN) ×4 IMPLANT
EVACUATOR SMOKE ACCUVAC VALLEY (FILTER)
GAUZE SPONGE 4X4 12PLY STRL (GAUZE/BANDAGES/DRESSINGS) ×2 IMPLANT
GLOVE BIO SURGEON STRL SZ 6 (GLOVE) ×2 IMPLANT
GLOVE BIO SURGEON STRL SZ7 (GLOVE) ×2 IMPLANT
GLOVE BIOGEL PI IND STRL 6.5 (GLOVE) ×1 IMPLANT
GLOVE BIOGEL PI IND STRL 7.0 (GLOVE) ×2 IMPLANT
GLOVE BIOGEL PI INDICATOR 6.5 (GLOVE) ×1
GLOVE BIOGEL PI INDICATOR 7.0 (GLOVE) ×2
GLOVE SURG SS PI 6.5 STRL IVOR (GLOVE) ×2 IMPLANT
GOWN STRL REUS W/ TWL LRG LVL3 (GOWN DISPOSABLE) ×2 IMPLANT
GOWN STRL REUS W/TWL 2XL LVL3 (GOWN DISPOSABLE) ×2 IMPLANT
GOWN STRL REUS W/TWL LRG LVL3 (GOWN DISPOSABLE) ×2
ILLUMINATOR WAVEGUIDE N/F (MISCELLANEOUS) IMPLANT
KIT BASIN OR (CUSTOM PROCEDURE TRAY) ×2 IMPLANT
KIT ROOM TURNOVER OR (KITS) ×2 IMPLANT
LIGHT WAVEGUIDE WIDE FLAT (MISCELLANEOUS) ×2 IMPLANT
NEEDLE SPNL 22GX3.5 QUINCKE BK (NEEDLE) ×2 IMPLANT
NS IRRIG 1000ML POUR BTL (IV SOLUTION) ×2 IMPLANT
PACK GENERAL/GYN (CUSTOM PROCEDURE TRAY) ×2 IMPLANT
PACK UNIVERSAL I (CUSTOM PROCEDURE TRAY) ×2 IMPLANT
PAD ABD 8X10 STRL (GAUZE/BANDAGES/DRESSINGS) ×2 IMPLANT
PAD ARMBOARD 7.5X6 YLW CONV (MISCELLANEOUS) ×2 IMPLANT
PENCIL BUTTON HOLSTER BLD 10FT (ELECTRODE) ×2 IMPLANT
PREFILTER EVAC NS 1 1/3-3/8IN (MISCELLANEOUS) IMPLANT
PREFILTER SMOKE EVAC (FILTER) IMPLANT
SPECIMEN JAR X LARGE (MISCELLANEOUS) ×2 IMPLANT
STOCKINETTE IMPERVIOUS 9X36 MD (GAUZE/BANDAGES/DRESSINGS) ×2 IMPLANT
STRIP CLOSURE SKIN 1/2X4 (GAUZE/BANDAGES/DRESSINGS) ×8 IMPLANT
SUT ETHILON 2 0 FS 18 (SUTURE) ×4 IMPLANT
SUT MON AB 4-0 PC3 18 (SUTURE) ×2 IMPLANT
SUT SILK 2 0 (SUTURE) ×1
SUT SILK 2 0 FS (SUTURE) ×2 IMPLANT
SUT SILK 2-0 18XBRD TIE 12 (SUTURE) ×1 IMPLANT
SUT VIC AB 3-0 SH 8-18 (SUTURE) ×8 IMPLANT
SYR 50ML SLIP (SYRINGE) ×2 IMPLANT
TOWEL OR 17X24 6PK STRL BLUE (TOWEL DISPOSABLE) ×2 IMPLANT
TOWEL OR 17X26 10 PK STRL BLUE (TOWEL DISPOSABLE) ×2 IMPLANT
TUBE CONNECTING 12X1/4 (SUCTIONS) IMPLANT

## 2015-02-15 NOTE — Interval H&P Note (Signed)
History and Physical Interval Note:  02/15/2015 2:19 PM  Darlene [name]  has presented today for surgery, with the diagnosis of RIGHT BREAST CANCER  The various methods of treatment have been discussed with the patient and family. After consideration of risks, benefits and other options for treatment, the patient has consented to  Procedure(s): RIGHT MODIFIED RADICAL MASTECTOMY (Right) as a surgical intervention .  The patient's history has been reviewed, patient examined, no change in status, stable for surgery.  I have reviewed the patient's chart and labs.  Questions were answered to the patient's satisfaction.     BYERLY,FAERA

## 2015-02-15 NOTE — Op Note (Signed)
Modified radical Mastectomy   Indications: This patient presents with history of right breast cancer with clinically negative axillary lymph node exam.  Pre-operative Diagnosis: right breast cancer, ypT2N1a, with intralymphatic tumor emboli on reduction specimen.    [name]-operative Diagnosis: right breast cancer, same  Surgeon: Stark Klein   Anesthesia: General endotracheal anesthesia and Local anesthesia 0.25.% bupivacaine, with epinephrine  ASA Class: 2  Procedure Details  The patient was seen in the Holding Room. The risks, benefits, complications, treatment options, and expected outcomes were discussed with the patient. The possibilities of reaction to medication, pulmonary aspiration, bleeding, infection, the need for additional procedures, failure to diagnose a condition, and creating a complication requiring transfusion or operation were discussed with the patient. The patient concurred with the proposed plan, giving informed consent.  The site of surgery properly noted/marked. The patient was taken to Operating Room # 2, identified as Darlene [name] and the procedure verified as Right modified radical Mastectomy. A Time Out was held and the above information confirmed.   After induction of anesthesia, the right arm, breast, and chest were prepped and draped in standard fashion.   The borders of the breast were identified and marked.  The inframammary incision was used as the lower border of the incision, and the superior incision was taken just over the nipple.    The superior incision was made with the #10 blade.    Mastectomy hooks were used to provide elevation of the skin edges, and the cautery was used to create the mastectomy flaps.  The dissection was taken to the fascia of the pectoralis major.  The penetrating vessels were clipped.  The superior flap was taken medially to the lateral sternal border, superiorly to the inferior border of the clavicle.  The inferior flap was  similarly created, inferiorly to the inframammary fold and laterally to the border of the latissimus.  The breast was taken off including the pectoralis fascia..     An axillary dissection was performed with removal of the associated lymph nodes and surrounding adipose tissue. This included levels I and II. This was accomplished by exposing the axillary vein anteriorly and inferiorly to the level of the pectoralis minor and laterally over the latissimus dorsi muscle. Posteriorly, the dissection continued to the subscapularis.  Small venous tributaries, lymphatics, and vessels were clipped and ligated or cauterized and divided. The subscapularis muscle was skeletonized. The long thoracic and thoracodorsal neurovascular bundles were identified and preserved.  The wound was irrigated. Two 19 Blake drains were placed laterally.   Hemostasis was achieved with cautery.  The wound was irrigated and closed with a 3-0 Vicryl deep dermal interrupted sutures and 4-0 Vicryl subcuticular closure in layers.    Sterile dressings were applied. At the end of the operation, all sponge, instrument, and needle counts were correct.  Findings: grossly clear surgical margins  Estimated Blood Loss:  less than 50 mL         Drains: 2 19 Blakes                Specimens: R breast and axillary contents         Complications:  None; patient tolerated the procedure well.         Disposition: PACU - hemodynamically stable.         Condition: stable

## 2015-02-15 NOTE — Transfer of Care (Signed)
Immediate Anesthesia Transfer of Care Note  Patient: Darlene [name]  Procedure(s) Performed: Procedure(s): RIGHT MODIFIED RADICAL MASTECTOMY (Right)  Patient Location: PACU  Anesthesia Type:General  Level of Consciousness: awake, alert  and oriented  Airway & Oxygen Therapy: Patient Spontanous Breathing  Post-op Assessment: Report given to RN and Post -op Vital signs reviewed and stable  Post vital signs: Reviewed and stable  Last Vitals:  Filed Vitals:   02/15/15 1405 02/15/15 1410  BP: 158/59 146/60  Pulse: 58 56  Temp:    Resp: 15 18    Complications: No apparent anesthesia complications

## 2015-02-15 NOTE — Anesthesia Preprocedure Evaluation (Addendum)
Anesthesia Evaluation  Patient identified by MRN, date of birth, ID band Patient awake    Reviewed: Allergy & Precautions, NPO status , Patient's Chart, lab work & pertinent test results  Airway Mallampati: I  TM Distance: >3 FB Neck ROM: Full    Dental no notable dental hx. (+) Teeth Intact   Pulmonary sleep apnea and Continuous Positive Airway Pressure Ventilation , pneumonia, resolved,    Pulmonary exam normal breath sounds clear to auscultation       Cardiovascular hypertension, Pt. on medications Normal cardiovascular exam+ Valvular Problems/Murmurs  Rhythm:Regular Rate:Normal     Neuro/Psych PSYCHIATRIC DISORDERS Anxiety Depression  Neuromuscular disease    GI/Hepatic negative GI ROS, Neg liver ROS, Hx/o Roux en Y gastric bypass   Endo/Other  diabetes, Well Controlled, Type 2, Insulin DependentObesity Right Breast Ca  Renal/GU negative Renal ROS  negative genitourinary   Musculoskeletal  (+) Arthritis ,   Abdominal (+) + obese,   Peds  Hematology  (+) anemia ,   Anesthesia Other Findings   Reproductive/Obstetrics                            Anesthesia Physical Anesthesia Plan  ASA: II  Anesthesia Plan: General   Post-op Pain Management: GA combined w/ Regional for post-op pain   Induction: Intravenous  Airway Management Planned: Oral ETT  Additional Equipment:   Intra-op Plan:   Post-operative Plan: Extubation in OR  Informed Consent: I have reviewed the patients History and Physical, chart, labs and discussed the procedure including the risks, benefits and alternatives for the proposed anesthesia with the patient or authorized representative who has indicated his/her understanding and acceptance.   Dental advisory given  Plan Discussed with: CRNA, Anesthesiologist and Surgeon  Anesthesia Plan Comments:         Anesthesia Quick Evaluation

## 2015-02-15 NOTE — Anesthesia Procedure Notes (Addendum)
Anesthesia Regional Block:  Pectoralis block  Pre-Anesthetic Checklist: ,, timeout performed, Correct Patient, Correct Site, Correct Laterality, Correct Procedure, Correct Position, site marked, Risks and benefits discussed,  Surgical consent,  Pre-op evaluation,  At surgeon's request and post-op pain management  Laterality: Right  Prep: chloraprep       Needles:  Injection technique: Single-shot  Needle Type: Echogenic Stimulator Needle     Needle Length: 9cm 9 cm Needle Gauge: 22 and 22 G    Additional Needles:  Procedures: ultrasound guided (picture in chart) Pectoralis block Narrative:  Start time: 02/15/2015 1:55 PM End time: 02/15/2015 2:02 PM Injection made incrementally with aspirations every 5 mL.  Performed by: Personally  Anesthesiologist: Josephine Igo  Additional Notes: Patient tolerated procedure well. Needle visualized with [name]. Good spread of LA visualized during procedure.    Procedure Name: Intubation Date/Time: 02/15/2015 3:11 PM Performed by: Sampson Si E Pre-anesthesia Checklist: Patient identified, Emergency Drugs available, Suction available, Patient being monitored and Timeout performed Patient Re-evaluated:Patient Re-evaluated prior to inductionOxygen Delivery Method: Circle system utilized Preoxygenation: Pre-oxygenation with 100% oxygen Intubation Type: IV induction Ventilation: Mask ventilation without difficulty Laryngoscope Size: Mac and 3 Grade View: Grade I Tube type: Oral Tube size: 7.0 mm Number of attempts: 1 Airway Equipment and Method: Stylet Placement Confirmation: ETT inserted through vocal cords under direct vision,  positive ETCO2 and breath sounds checked- equal and bilateral Secured at: 21 cm Tube secured with: Tape Dental Injury: Teeth and Oropharynx as per pre-operative assessment

## 2015-02-16 ENCOUNTER — Telehealth: Payer: Self-pay | Admitting: *Deleted

## 2015-02-16 ENCOUNTER — Encounter (HOSPITAL_COMMUNITY): Payer: Self-pay | Admitting: General Practice

## 2015-02-16 ENCOUNTER — Other Ambulatory Visit: Payer: Self-pay | Admitting: *Deleted

## 2015-02-16 DIAGNOSIS — D0511 Intraductal carcinoma in situ of right breast: Secondary | ICD-10-CM | POA: Diagnosis not present

## 2015-02-16 LAB — CBC
HCT: 24.5 % — ABNORMAL LOW (ref 36.0–46.0)
Hemoglobin: 7.9 g/dL — ABNORMAL LOW (ref 12.0–15.0)
MCH: 28.2 pg (ref 26.0–34.0)
MCHC: 32.2 g/dL (ref 30.0–36.0)
MCV: 87.5 fL (ref 78.0–100.0)
Platelets: 165 10*3/uL (ref 150–400)
RBC: 2.8 MIL/uL — ABNORMAL LOW (ref 3.87–5.11)
RDW: 15.4 % (ref 11.5–15.5)
WBC: 6.3 10*3/uL (ref 4.0–10.5)

## 2015-02-16 LAB — BASIC METABOLIC PANEL
Anion gap: 7 (ref 5–15)
BUN: 18 mg/dL (ref 6–20)
CO2: 23 mmol/L (ref 22–32)
Calcium: 8.4 mg/dL — ABNORMAL LOW (ref 8.9–10.3)
Chloride: 107 mmol/L (ref 101–111)
Creatinine, Ser: 1.2 mg/dL — ABNORMAL HIGH (ref 0.44–1.00)
GFR calc Af Amer: 55 mL/min — ABNORMAL LOW (ref >60)
GFR calc non Af Amer: 47 mL/min — ABNORMAL LOW (ref >60)
Glucose, Bld: 218 mg/dL — ABNORMAL HIGH (ref 65–99)
Potassium: 4.3 mmol/L (ref 3.5–5.1)
Sodium: 137 mmol/L (ref 135–145)

## 2015-02-16 LAB — GLUCOSE, CAPILLARY: Glucose-Capillary: 157 mg/dL — ABNORMAL HIGH (ref 65–99)

## 2015-02-16 MED ORDER — LOSARTAN POTASSIUM 50 MG PO TABS: 100.0000 mg | ORAL_TABLET | Freq: Every day | ORAL | Status: DC

## 2015-02-16 MED ORDER — METHOCARBAMOL 500 MG PO TABS: 500.0000 mg | ORAL_TABLET | Freq: Four times a day (QID) | ORAL | Status: DC | PRN

## 2015-02-16 MED ORDER — OXYCODONE HCL 5 MG PO TABS: 5.0000 mg | ORAL_TABLET | ORAL | Status: DC | PRN

## 2015-02-16 NOTE — Progress Notes (Signed)
Discharge paperwork given to patient. IV removed. Prescription given to patient. No questions verbalized.

## 2015-02-16 NOTE — Telephone Encounter (Signed)
Obtained prior authorization from from [place] for Xeloda.  Completed with MD sig and faxed with confirmation

## 2015-02-16 NOTE — Anesthesia Postprocedure Evaluation (Signed)
Anesthesia Post Note  Patient: Darlene [name]  Procedure(s) Performed: Procedure(s) (LRB): RIGHT MODIFIED RADICAL MASTECTOMY (Right)  Patient location during evaluation: PACU Anesthesia Type: General Level of consciousness: sedated and patient cooperative Pain management: pain level controlled Vital Signs Assessment: post-procedure vital signs reviewed and stable Respiratory status: spontaneous breathing Cardiovascular status: stable Anesthetic complications: no    Last Vitals:  Filed Vitals:   02/16/15 0113 02/16/15 0554  BP: 140/60 128/56  Pulse: 92 81  Temp: 37 C 36.8 C  Resp: 16 16    Last Pain:  Filed Vitals:   02/16/15 0831  PainSc: [place]

## 2015-02-16 NOTE — Discharge Summary (Signed)
Physician Discharge Summary  Patient ID: Darlene [name] MRN: [id number] DOB/AGE: [birth year] 65 y.o.  Admit date: 02/15/2015 Discharge date: 02/16/2015  Admission Diagnoses:  Discharge Diagnoses:  Active Problems:   Breast cancer metastasized to axillary lymph node [name])   Discharged Condition: stable  Hospital Course: Pt was admitted to the floor following modified radical mastectomy.  She did well overnight with controlled pain.  She was able to void independently.  She had reasonable drain output and no evidence of hematoma on POD 1.  She is discharged to home in stable condition.  Her husband demonstrated proficiency with the drain.    Consults: None  Significant Diagnostic Studies: labs: HCT 28 to 24.5.  Treatments: surgery: see above  Discharge Exam: Blood pressure 128/56, pulse 81, temperature 98.3 F (36.8 C), temperature source Oral, resp. rate 16, height 5\' 9"  (1.753 m), weight 93.7 kg (206 lb 9.1 oz), last menstrual period 10/22/2007, SpO2 97 %. General appearance: alert, cooperative and no distress Resp: breathing comfortably Chest wall: right sided chest wall tenderness, as expected Cardio: regular rate and rhythm  Disposition: 01-Home or Self Care  Discharge Instructions    Change dressing (specify)    Complete by:  As directed   Measure and record drain output 1-3 times per day.  Bring record to clinic.     Diet - low sodium heart healthy    Complete by:  As directed      Increase activity slowly    Complete by:  As directed             Medication List    TAKE these medications        acetaminophen 325 MG tablet  Commonly known as:  TYLENOL  Take 650 mg by mouth 2 (two) times daily as needed for moderate pain or headache.     CALCIUM CITRATE+D3 PO  Take 1,800 mg by mouth daily.     capecitabine 500 MG tablet  Commonly known as:  XELODA  Take 2 tablets by mouth after a meal 2 times a day on radiation days; do not take on days when you do  not receive radiation     [facility] PROBIOTIC PO  Take 1 capsule by mouth daily.     diphenhydrAMINE 25 MG tablet  Commonly known as:  BENADRYL  Take 25 mg by mouth daily as needed for allergies or sleep.     diphenoxylate-atropine 2.5-0.025 MG tablet  Commonly known as:  LOMOTIL  Take 1 tablet by mouth 4 (four) times daily as needed for diarrhea or loose stools. Reported on 02/13/2015     docusate sodium 50 MG capsule  Commonly known as:  COLACE  Take 50 mg by mouth as needed for mild constipation.     HERCEPTIN IV  Inject into the vein. Infusion at [facility]     losartan 100 MG tablet  Commonly known as:  COZAAR  Take 1 tablet by mouth daily.     Melatonin 5 MG Tabs  Take 5 mg by mouth at bedtime.     methocarbamol 500 MG tablet  Commonly known as:  ROBAXIN  Take 1 tablet (500 mg total) by mouth every 6 (six) hours as needed for muscle spasms.     multivitamin with minerals Tabs tablet  Take 1 tablet by mouth 2 (two) times daily. With iron     NOVOLIN 70/30 (70-30) 100 UNIT/ML injection  Generic drug:  insulin NPH-regular Human  Inject 10-12 Units into the skin 2 (two)  times daily with a meal. 10 units two times a day     ondansetron 8 MG tablet  Commonly known as:  ZOFRAN  Take 8 mg by mouth daily.     ONETOUCH VERIO test strip  Generic drug:  glucose blood  1 each by Other route as needed for other. Use as instructed     oxyCODONE 5 MG immediate release tablet  Commonly known as:  Oxy IR/ROXICODONE  Take 1-2 tablets (5-10 mg total) by mouth every 4 (four) hours as needed for moderate pain.     QUESTRAN 4 g packet  Generic drug:  cholestyramine  Take 4 g by mouth 2 (two) times daily. Reported on 02/13/2015     TRINTELLIX 20 MG Tabs  Generic drug:  Vortioxetine HBr  Take 20 mg by mouth daily.           Follow-up Information    Follow up with [facility], [place].   Specialty:  General Surgery   Why:  as scheduled   Contact information:   [address] [place] [address] [phone]       Signed: Stark Klein 02/16/2015, 11:45 AM

## 2015-02-16 NOTE — Discharge Instructions (Signed)
[facility] °[phone] ° °MASTECTOMY: POST OP INSTRUCTIONS ° °Always review your discharge instruction sheet given to you by the facility where your surgery was performed. °IF YOU HAVE DISABILITY OR FAMILY LEAVE FORMS, YOU MUST BRING THEM TO THE OFFICE FOR PROCESSING.   °DO NOT GIVE THEM TO YOUR DOCTOR. °A prescription for pain medication may be given to you upon discharge.  Take your pain medication as prescribed, if needed.  If narcotic pain medicine is not needed, then you may take acetaminophen (Tylenol) or ibuprofen (Advil) as needed. °1. Take your usually prescribed medications unless otherwise directed. °2. If you need a refill on your pain medication, please contact your pharmacy.  They will contact our office to request authorization.  Prescriptions will not be filled after 5pm or on week-ends. °3. You should follow a light diet the first few days after arrival home, such as soup and crackers, etc.  Resume your normal diet the day after surgery. °4. Most patients will experience some swelling and bruising on the chest and underarm.  Ice packs will help.  Swelling and bruising can take several days to resolve.  °5. It is common to experience some constipation if taking pain medication after surgery.  Increasing fluid intake and taking a stool softener (such as Colace) will usually help or prevent this problem from occurring.  A mild laxative (Milk of Magnesia or Miralax) should be taken according to package instructions if there are no bowel movements after 48 hours. °6. Unless discharge instructions indicate otherwise, leave your bandage dry and in place until your next appointment in 3-5 days.  You may take a limited sponge bath.  No tube baths or showers until the drains are removed.  You may have steri-strips (small skin tapes) in place directly over the incision.  These strips should be left on the skin for 7-10 days.  If your surgeon used skin glue on the incision, you may  shower in 24 hours.  The glue will flake off over the next 2-3 weeks.  Any sutures or staples will be removed at the office during your follow-up visit. °7. DRAINS:  If you have drains in place, it is important to keep a list of the amount of drainage produced each day in your drains.  Before leaving the hospital, you should be instructed on drain care.  Call our office if you have any questions about your drains. °8. ACTIVITIES:  You may resume regular (light) daily activities beginning the next day--such as daily self-care, walking, climbing stairs--gradually increasing activities as tolerated.  You may have sexual intercourse when it is comfortable.  Refrain from any heavy lifting or straining until approved by your doctor. °a. You may drive when you are no longer taking prescription pain medication, you can comfortably wear a seatbelt, and you can safely maneuver your car and apply brakes. °b. RETURN TO WORK:  __________________________________________________________ °9. You should see your doctor in the office for a follow-up appointment approximately 3-5 days after your surgery.  Your doctor’s nurse will typically make your follow-up appointment when she calls you with your pathology report.  Expect your pathology report 2-3 business days after your surgery.  You may call to check if you do not hear from us after three days.   °10. OTHER INSTRUCTIONS: ______________________________________________________________________________________________ ____________________________________________________________________________________________ °WHEN TO CALL YOUR DOCTOR: °1. Fever over 101.0 °2. Nausea and/or vomiting °3. Extreme swelling or bruising °4. Continued bleeding from incision. °5. Increased pain, redness, or drainage from the incision. °  The clinic staff is available to answer your questions during regular business hours.  Please don’t hesitate to call and ask to speak to one of the nurses for clinical  concerns.  If you have a medical emergency, go to the nearest emergency room or call 911.  A surgeon from [facility] is always on call at the hospital. °[address], [address], [place], [place]  [address] ? [address] [address], [place], [place]   [address] °[phone] ? [phone] ? FAX [phone] °Web site: [facility] °

## 2015-02-17 NOTE — Progress Notes (Signed)
Quick Note:  Please let patient know that all additional lymph nodes are negative (15!) and all margins are negative. There was still additional cancer in the breast, but it is all gone now. ______

## 2015-02-21 ENCOUNTER — Telehealth: Payer: Self-pay | Admitting: *Deleted

## 2015-02-21 NOTE — Telephone Encounter (Signed)
This RN received call from [facility] with [facility] stating denial for prior auth for capecitibine.  [facility] did not have reason for denial- states " we do not have that information "  This RN requested number for contact for prior authorization.  Given as [phone] with case number as [id number].  [facility]ona requested a return call post this office contacting prior auth department due to " they do not communicate with [name] ".  Return call number for [facility] given as [phone].  This message will be forwarded to Montel Clock and managed care for appropriate follow up.  Pt is schedule 2/13 with Dr Jannifer Rodney with goal to start radiation later this month.

## 2015-03-02 ENCOUNTER — Other Ambulatory Visit: Payer: Self-pay | Admitting: Nurse Practitioner

## 2015-03-02 ENCOUNTER — Encounter: Payer: Self-pay | Admitting: Pharmacist

## 2015-03-02 NOTE — Progress Notes (Signed)
Oral Chemotherapy Pharmacist Encounter   I spoke with patient for overview of new oral chemotherapy medication: Xeloda. Pt is doing well. The [facility] per patient insurance. Approval for Xeloda was received after initial denial and Darlene [name] received her Xeloda a few days ago. She will be starting in the near future after follow up with Dr. Jana Hakim and plans to start radiation concurrently once healed from mastectomy.   Counseled patient on administration, dosing, side effects, safe handling, and monitoring. Side effects include but not limited to: Fatigue, myelosuppression, mucositis, hand/foot syndrome and diarrhea.  Darlene [name] voiced understanding and appreciation.   All questions answered.  Will follow up in 2 weeks for adherence and toxicity management.   Thank you,  Montel Clock, PharmD, [facility]

## 2015-03-06 ENCOUNTER — Ambulatory Visit: Payer: BLUE CROSS/BLUE SHIELD

## 2015-03-06 ENCOUNTER — Ambulatory Visit (HOSPITAL_BASED_OUTPATIENT_CLINIC_OR_DEPARTMENT_OTHER): Payer: BLUE CROSS/BLUE SHIELD | Admitting: Oncology

## 2015-03-06 ENCOUNTER — Other Ambulatory Visit (HOSPITAL_BASED_OUTPATIENT_CLINIC_OR_DEPARTMENT_OTHER): Payer: BLUE CROSS/BLUE SHIELD

## 2015-03-06 ENCOUNTER — Telehealth: Payer: Self-pay | Admitting: Oncology

## 2015-03-06 ENCOUNTER — Encounter: Payer: Self-pay | Admitting: *Deleted

## 2015-03-06 ENCOUNTER — Ambulatory Visit (HOSPITAL_BASED_OUTPATIENT_CLINIC_OR_DEPARTMENT_OTHER): Payer: BLUE CROSS/BLUE SHIELD

## 2015-03-06 VITALS — BP 126/51 | HR 58 | Temp 97.8°F | Resp 18 | Ht 69.0 in | Wt 201.7 lb

## 2015-03-06 DIAGNOSIS — Z5112 Encounter for antineoplastic immunotherapy: Secondary | ICD-10-CM | POA: Diagnosis not present

## 2015-03-06 DIAGNOSIS — C773 Secondary and unspecified malignant neoplasm of axilla and upper limb lymph nodes: Secondary | ICD-10-CM

## 2015-03-06 DIAGNOSIS — C50411 Malignant neoplasm of upper-outer quadrant of right female breast: Secondary | ICD-10-CM

## 2015-03-06 DIAGNOSIS — Z171 Estrogen receptor negative status [ER-]: Secondary | ICD-10-CM

## 2015-03-06 DIAGNOSIS — Z95828 Presence of other vascular implants and grafts: Secondary | ICD-10-CM

## 2015-03-06 LAB — COMPREHENSIVE METABOLIC PANEL
ALT: 47 U/L (ref 0–55)
AST: 34 U/L (ref 5–34)
Albumin: 3.1 g/dL — ABNORMAL LOW (ref 3.5–5.0)
Alkaline Phosphatase: 73 U/L (ref 40–150)
Anion Gap: 8 mEq/L (ref 3–11)
BUN: 28.2 mg/dL — ABNORMAL HIGH (ref 7.0–26.0)
CO2: 23 mEq/L (ref 22–29)
Calcium: 8.9 mg/dL (ref 8.4–10.4)
Chloride: 109 mEq/L (ref 98–109)
Creatinine: 1.2 mg/dL — ABNORMAL HIGH (ref 0.6–1.1)
EGFR: 47 mL/min/{1.73_m2} — ABNORMAL LOW (ref >90)
Glucose: 205 mg/dl — ABNORMAL HIGH (ref 70–140)
Potassium: 4 mEq/L (ref 3.5–5.1)
Sodium: 140 mEq/L (ref 136–145)
Total Bilirubin: 0.43 mg/dL (ref 0.20–1.20)
Total Protein: 5.8 g/dL — ABNORMAL LOW (ref 6.4–8.3)

## 2015-03-06 LAB — CBC WITH DIFFERENTIAL/PLATELET
BASO%: 0.5 % (ref 0.0–2.0)
Basophils Absolute: 0 10*3/uL (ref 0.0–0.1)
EOS%: 2.3 % (ref 0.0–7.0)
Eosinophils Absolute: 0.1 10*3/uL (ref 0.0–0.5)
HCT: 27.3 % — ABNORMAL LOW (ref 34.8–46.6)
HGB: 8.9 g/dL — ABNORMAL LOW (ref 11.6–15.9)
LYMPH%: 29.1 % (ref 14.0–49.7)
MCH: 27.9 pg (ref 25.1–34.0)
MCHC: 32.5 g/dL (ref 31.5–36.0)
MCV: 86 fL (ref 79.5–101.0)
MONO#: 0.4 10*3/uL (ref 0.1–0.9)
MONO%: 6.8 % (ref 0.0–14.0)
NEUT#: 3.2 10*3/uL (ref 1.5–6.5)
NEUT%: 61.3 % (ref 38.4–76.8)
Platelets: 191 10*3/uL (ref 145–400)
RBC: 3.17 10*6/uL — ABNORMAL LOW (ref 3.70–5.45)
RDW: 17.3 % — ABNORMAL HIGH (ref 11.2–14.5)
WBC: 5.2 10*3/uL (ref 3.9–10.3)
lymph#: 1.5 10*3/uL (ref 0.9–3.3)

## 2015-03-06 MED ORDER — DIPHENHYDRAMINE HCL 25 MG PO CAPS
25.0000 mg | ORAL_CAPSULE | Freq: Once | ORAL | Status: AC
  Administered 2015-03-06: 25 mg via ORAL

## 2015-03-06 MED ORDER — HEPARIN SOD (PORK) LOCK FLUSH 100 UNIT/ML IV SOLN
500.0000 [IU] | Freq: Once | INTRAVENOUS | Status: AC | PRN
  Administered 2015-03-06: 500 [IU]
  Filled 2015-03-06: qty 5

## 2015-03-06 MED ORDER — SODIUM CHLORIDE 0.9 % IV SOLN
6.0000 mg/kg | Freq: Once | INTRAVENOUS | Status: AC
  Administered 2015-03-06: 567 mg via INTRAVENOUS
  Filled 2015-03-06: qty 27

## 2015-03-06 MED ORDER — ACETAMINOPHEN 325 MG PO TABS
ORAL_TABLET | ORAL | Status: AC
  Filled 2015-03-06: qty 2

## 2015-03-06 MED ORDER — SODIUM CHLORIDE 0.9 % IV SOLN
Freq: Once | INTRAVENOUS | Status: AC
  Administered 2015-03-06: 10:00:00 via INTRAVENOUS

## 2015-03-06 MED ORDER — DIPHENHYDRAMINE HCL 25 MG PO CAPS
ORAL_CAPSULE | ORAL | Status: AC
  Filled 2015-03-06: qty 1

## 2015-03-06 MED ORDER — ACETAMINOPHEN 325 MG PO TABS
650.0000 mg | ORAL_TABLET | Freq: Once | ORAL | Status: AC
  Administered 2015-03-06: 650 mg via ORAL

## 2015-03-06 MED ORDER — SODIUM CHLORIDE 0.9% FLUSH
10.0000 mL | INTRAVENOUS | Status: DC | PRN
  Administered 2015-03-06: 10 mL via INTRAVENOUS
  Filled 2015-03-06: qty 10

## 2015-03-06 MED ORDER — SODIUM CHLORIDE 0.9 % IJ SOLN
10.0000 mL | INTRAMUSCULAR | Status: DC | PRN
  Administered 2015-03-06: 10 mL
  Filled 2015-03-06: qty 10

## 2015-03-06 NOTE — Progress Notes (Signed)
[place]  [phone] [phone]     ID: Darlene [name] DOB: 1950-04-12  MR#: [id number]  MIW#:[id number]  Patient Care Team: Thressa Sheller, MD as PCP - General (Internal Medicine) Bryson Ha Himmelrich, RD as Dietitian (Bariatrics) Stark Klein, MD as Consulting Physician (General Surgery) Chauncey Cruel, MD as Consulting Physician (Oncology) Arloa Koh, MD as Consulting Physician (Radiation Oncology) Mauro Kaufmann, RN as Registered Nurse Rockwell Germany, RN as Registered Nurse Jacelyn Pi, MD as Consulting Physician (Endocrinology) PCP: Thressa Sheller, MD GYN: Kem Boroughs FNP OTHER MD:  CHIEF COMPLAINT:  HER-2 positive, estrogen receptor negative breast cancer  CURRENT TREATMENT:  anti-HER-2 treatment; adjuvant radiation and capecitabine pending  BREAST CANCER HISTORY: From the original intake note:  "Darlene [name]" had screening mammography at her gynecologist suggestive of a possible abnormality in the right breast. However right diagnostic mammogram 04/12/2013 at [facility] was negative. More recently however, she noted a lump in her right breast and brought it to her gynecologist's attention. On 07/18/2014 the patient had bilateral diagnostic mammography with tomosynthesis at [facility]. The breast density was category B. In the right breast upper outer quadrant there was an area of focal asymmetry with indistinct margins.[name] Ultrasound was obtained and showed in addition to the mass in question, measuring 1.7 cm, an abnormal-appearing lymph node in the right axillary tail.  On 07/19/2014 the patient underwent biopsy of the right breast massin question as well as a right axillary lymph node. Both were positive for invasive ductal carcinoma, grade 2, estrogen and progesterone receptor negative, with an MIB-1 of 90%, and HER-2 pending. Incidentally the lymph node biopsy showed a lymphocytic inflammatory component but no lymph node architecture.  Her  subsequent history is as detailed below.  INTERVAL HISTORY:  Darlene [name] returns today for follow-up of her HER-2/neu positive breast cancer, accompanied by her husband [name]. Since the last visit here she underwent right mastectomy. The final pathology ([id number]) showed no evidence of residual invasive disease. There were scattered foci of ductal carcinoma in situ, high-grade, but the margins were clear. All 15 axillary lymph nodes were clear as well. In addition since her last visit here she had a brain MRI which was negative. She has already obtained the capecitabine, with a copy of $5. We are not quite ready to start that drug however.  REVIEW OF SYSTEMS: Darlene [name] did generally well with the surgery. She only took pain medication for one day. She had no fever. She still has the drains in place and yesterday after walking for about a mile and a half she had 40 mL in one of the drains so she is not quite ready to have those removed yet. She is eager to get back to work but understandably hesitant to do so with the drains in place. Chest some seasonal allergies. She has had some problems with nausea. She says her sugars have been well-controlled. A detailed review of systems today was otherwise stable  PAST MEDICAL HISTORY: Past Medical History  Diagnosis Date  . Obesity   . Hypertension   . Hyperlipidemia   . Diabetes mellitus without complication ([place])     25+ years  . Eating disorder     binge eating  . Balance problem   . Arthritis     in fingers, shoulders  . Anemia     hx of  . Depression   . Ovarian cyst rupture     possible  . Clumsiness   . Breast cancer of upper-outer  quadrant of right female breast ([place]) 07/22/2014  . Breast cancer ([place])   . Anxiety   . Pneumonia   . Neuromuscular disorder ([place])     diabetic neuropathy  . Obstructive sleep apnea on CPAP     uses cpap nightly  . Heart murmur     PAST SURGICAL HISTORY: Past Surgical History  Procedure Laterality Date  .  Carpal tunnel release Right   . Carpal tunnel release Left   . Breath tek h pylori N/A 09/15/2012    Procedure: BREATH TEK H PYLORI;  Surgeon: Pedro Earls, MD;  Location: [facility];  Service: General;  Laterality: N/A;  . Dupuytren contracture release    . Trigger finger release      x3  . Toe surgery  1970s    bone spur  . Rotator cuff repair Right 2005  . Gastric roux-en-y N/A 11/02/2012    Procedure: LAPAROSCOPIC ROUX-EN-Y GASTRIC;  Surgeon: Pedro Earls, MD;  Location: [facility];  Service: General;  Laterality: N/A;  . Bunionectomy Left 12/2013  . Portacath placement Left 08/02/2014    Procedure: INSERTION PORT-A-CATH;  Surgeon: Stark Klein, MD;  Location: [place] [facility];  Service: General;  Laterality: Left;  . Breast lumpectomy with radioactive seed and sentinel lymph node biopsy Right 12/27/2014    Procedure: BREAST LUMPECTOMY WITH RADIOACTIVE SEED AND SENTINEL LYMPH NODE BIOPSY;  Surgeon: Stark Klein, MD;  Location: [place];  Service: General;  Laterality: Right;  . Breast reduction surgery Bilateral 01/06/2015    Procedure: BILATERAL BREAST REDUCTION, RIGHT ONCOPLASTIC RECONSTRUCTION),LEFT BREAST REDUCTION FOR SYMMETRY;  Surgeon: [name], MD;  Location: [place];  Service: Plastics;  Laterality: Bilateral;  . Mastectomy Right 02/15/2015    modified  . Modified mastectomy Right 02/15/2015    Procedure: RIGHT MODIFIED RADICAL MASTECTOMY;  Surgeon: Stark Klein, MD;  Location: [place];  Service: General;  Laterality: Right;    FAMILY HISTORY Family History  Problem Relation Age of Onset  . CVA Mother   . Diabetes Father   . Heart failure Father   . Hypertension Sister   . Diabetes Brother   . Diabetes Paternal Uncle   . Breast cancer Paternal Grandmother    the patient's father died from complications of diabetes at age 6. The patient's mother died at age 21 with a subarachnoid hemorrhage. The patient had one brother, one sister. The  only breast cancer was the patient's paternal grandmother diagnosed in her 44s. There is no history of ovarian cancer in the family.  GYNECOLOGIC HISTORY:  Patient's last menstrual period was 10/22/2007.  menarche age 6, the patient is GX P0. She went through the change of life in 2011. She did not take hormone replacement.  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 Her husband [name] is a retired Visual merchandiser (he taught at [employer]).  [name] has 2 children from an earlier marriage, Rockey Situ who teaches in [place], and Allsion Nogales,  who works at the [employer] in in [place]. He has 1 grand son, aged 24. The patient and her husband are not church attender's    ADVANCED DIRECTIVES:  Not in place   HEALTH MAINTENANCE: Social History  Substance Use Topics  . Smoking status: Never Smoker   . Smokeless tobacco: Never Used  . Alcohol Use: 0.0 oz/week    0 Standard drinks or equivalent per week     Comment:  social     Colonoscopy:  May 2016  PAP:  Bone density: 07/05/2013 normal, with a T score of 0.4  Lipid panel:  Allergies  Allergen Reactions  . Peanuts [Peanut Oil] Itching    "Large amounts"  . Tape Hives and Rash    Current Outpatient Prescriptions  Medication Sig Dispense Refill  . acetaminophen (TYLENOL) 325 MG tablet Take 650 mg by mouth 2 (two) times daily as needed for moderate pain or headache.    . Calcium Citrate-Vitamin D (CALCIUM CITRATE+D3 PO) Take 1,800 mg by mouth daily.     . capecitabine (XELODA) 500 MG tablet Take 2 tablets by mouth after a meal 2 times a day on radiation days; do not take on days when you do not receive radiation (Patient not taking: Reported on 02/13/2015) 150 tablet 4  . cholestyramine (QUESTRAN) 4 g packet Take 4 g by mouth 2 (two) times daily. Reported on 02/13/2015    . diphenhydrAMINE (BENADRYL) 25 MG tablet Take 25 mg by mouth daily as needed for  allergies or sleep.    . diphenoxylate-atropine (LOMOTIL) 2.5-0.025 MG tablet Take 1 tablet by mouth 4 (four) times daily as needed for diarrhea or loose stools. Reported on 02/13/2015    . docusate sodium (COLACE) 50 MG capsule Take 50 mg by mouth as needed for mild constipation.    [name] glucose blood (ONETOUCH VERIO) test strip 1 each by Other route as needed for other. Use as instructed    . losartan (COZAAR) 100 MG tablet Take 1 tablet by mouth daily.     . Melatonin 5 MG TABS Take 5 mg by mouth at bedtime.     . methocarbamol (ROBAXIN) 500 MG tablet Take 1 tablet (500 mg total) by mouth every 6 (six) hours as needed for muscle spasms. 30 tablet 1  . Multiple Vitamin (MULTIVITAMIN WITH MINERALS) TABS tablet Take 1 tablet by mouth 2 (two) times daily. With iron    . NOVOLIN 70/30 (70-30) 100 UNIT/ML injection Inject 10-12 Units into the skin 2 (two) times daily with a meal. 10 units two times a day    . ondansetron (ZOFRAN) 8 MG tablet TAKE 1 TABLET EVERY 8 HOURS AS NEEDED FOR NAUSEA AND VOMITING. 20 tablet 0  . oxyCODONE (OXY IR/ROXICODONE) 5 MG immediate release tablet Take 1-2 tablets (5-10 mg total) by mouth every 4 (four) hours as needed for moderate pain. 30 tablet 0  . Probiotic Product ([employer] PROBIOTIC PO) Take 1 capsule by mouth daily.    . Trastuzumab (HERCEPTIN IV) Inject into the vein. Infusion at [facility]    . Vortioxetine HBr (TRINTELLIX) 20 MG TABS Take 20 mg by mouth daily.     No current facility-administered medications for this visit.    OBJECTIVE:  Middle-aged white woman who appears stated age 30 Vitals:   03/06/15 0857  BP: 126/51  Pulse: 58  Temp: 97.8 F (36.6 C)  Resp: 18     Body mass index is 29.77 kg/(m^2).    ECOG FS:1 - Symptomatic but completely ambulatory  Sclerae unicteric, pupils round and equal Oropharynx clear and moist-- no thrush or other lesions No cervical or supraclavicular adenopathy Lungs no rales or rhonchi Heart regular rate and rhythm Abd  soft, nontender, positive bowel sounds MSK no focal spinal tenderness, no upper extremity lymphedema Neuro: nonfocal, well oriented, appropriate affect Breasts: The right breast is status post mastectomy. There are 2 drains still in place. There is approximately 20 mL serosanguineous liquid in  each bulb. The incision is healing nicely. There is no swelling, erythema, or dehiscence. Left breast is unremarkable    LAB RESULTS:  CMP     Component Value Date/Time   NA 137 02/16/2015 0418   NA 141 02/13/2015 1534   K 4.3 02/16/2015 0418   K 4.4 02/13/2015 1534   CL 107 02/16/2015 0418   CO2 23 02/16/2015 0418   CO2 25 02/13/2015 1534   GLUCOSE 218* 02/16/2015 0418   GLUCOSE 97 02/13/2015 1534   BUN 18 02/16/2015 0418   BUN 35.8* 02/13/2015 1534   CREATININE 1.20* 02/16/2015 0418   CREATININE 1.2* 02/13/2015 1534   CREATININE 1.10 02/18/2013 0827   CALCIUM 8.4* 02/16/2015 0418   CALCIUM 9.0 02/13/2015 1534   PROT 6.3* 02/13/2015 1534   PROT 6.0* 12/18/2014 1540   ALBUMIN 3.5 02/13/2015 1534   ALBUMIN 3.6 12/18/2014 1540   AST 43* 02/13/2015 1534   AST 21 12/18/2014 1540   ALT 51 02/13/2015 1534   ALT 22 12/18/2014 1540   ALKPHOS 82 02/13/2015 1534   ALKPHOS 73 12/18/2014 1540   BILITOT 0.56 02/13/2015 1534   BILITOT 1.0 12/18/2014 1540   GFRNONAA 47* 02/16/2015 0418   GFRNONAA 55* 02/18/2013 0827   GFRAA 55* 02/16/2015 0418   GFRAA 63 02/18/2013 0827    INo results found for: SPEP, UPEP  Lab Results  Component Value Date   WBC 5.2 03/06/2015   NEUTROABS 3.2 03/06/2015   HGB 8.9* 03/06/2015   HCT 27.3* 03/06/2015   MCV 86.0 03/06/2015   PLT 191 03/06/2015      Chemistry      Component Value Date/Time   NA 137 02/16/2015 0418   NA 141 02/13/2015 1534   K 4.3 02/16/2015 0418   K 4.4 02/13/2015 1534   CL 107 02/16/2015 0418   CO2 23 02/16/2015 0418   CO2 25 02/13/2015 1534   BUN 18 02/16/2015 0418   BUN 35.8* 02/13/2015 1534   CREATININE 1.20* 02/16/2015  0418   CREATININE 1.2* 02/13/2015 1534   CREATININE 1.10 02/18/2013 0827      Component Value Date/Time   CALCIUM 8.4* 02/16/2015 0418   CALCIUM 9.0 02/13/2015 1534   ALKPHOS 82 02/13/2015 1534   ALKPHOS 73 12/18/2014 1540   AST 43* 02/13/2015 1534   AST 21 12/18/2014 1540   ALT 51 02/13/2015 1534   ALT 22 12/18/2014 1540   BILITOT 0.56 02/13/2015 1534   BILITOT 1.0 12/18/2014 1540       No results found for: LABCA2  No components found for: LABCA125  No results for input(s): INR in the last 168 hours.  Urinalysis    Component Value Date/Time   COLORURINE YELLOW 12/18/2014 [place] 12/18/2014 1635   LABSPEC 1.023 12/18/2014 1635   PHURINE 5.0 12/18/2014 1635   GLUCOSEU NEGATIVE 12/18/2014 1635   HGBUR NEGATIVE 12/18/2014 1635   BILIRUBINUR NEGATIVE 12/18/2014 1635   BILIRUBINUR neg 06/29/2013 1017   KETONESUR NEGATIVE 12/18/2014 1635   PROTEINUR 30* 12/18/2014 1635   PROTEINUR trace 06/29/2013 1017   UROBILINOGEN negative 06/29/2013 1017   NITRITE NEGATIVE 12/18/2014 1635   NITRITE neg 06/29/2013 1017   LEUKOCYTESUR NEGATIVE 12/18/2014 1635    STUDIES: Mr Jeri Cos Wo Contrast  2015/02/07  CLINICAL DATA:  65 year old female with breast cancer. Staging. Subsequent encounter. EXAM: MRI HEAD WITHOUT AND WITH CONTRAST TECHNIQUE: Multiplanar, multiecho pulse sequences of the brain and surrounding structures were obtained without and with intravenous contrast. CONTRAST:  58m MULTIHANCE GADOBENATE DIMEGLUMINE 529 MG/ML IV SOLN [facility] (no images available). FINDINGS: Cerebral volume is within normal limits for age. No restricted diffusion to suggest acute infarction. No midline shift, mass effect, evidence of mass lesion, ventriculomegaly, extra-axial collection or acute intracranial hemorrhage. Cervicomedullary junction and pituitary are within normal limits. No abnormal enhancement  identified. No dural thickening identified. Major intracranial vascular flow voids are within normal limits. Negative visualized cervical spinal cord. Scattered small nonspecific mostly anterior frontal lobe cerebral white matter T2 and FLAIR hyperintense foci, mild to moderate for age. No cortical encephalomalacia or chronic cerebral blood products. Visible internal auditory structures appear normal. Mastoids are clear. Trace paranasal sinus mucosal thickening. Orbit and scalp soft tissues are within normal limits. Nonspecific decreased calvarium bone marrow signal, but no enhancing or suspicious bone marrow lesion identified. IMPRESSION: No acute or metastatic intracranial abnormality. Electronically Signed   By: HGenevie AnnM.D.   On: 02/06/2015 18:23    ASSESSMENT: 65y.o. Darlene [name] woman status post right breast and right axillary lymph node biopsy 07/19/2014 for a cT2  pN1, stage IIB  invasive ductal carcinoma, grade 2, estrogen and progesterone receptor negative, with an MIB-1 of 90%, and HER-2 amplified  (1) neoadjuvant chemotherapy started 08/08/2014, consisting of carboplatin, docetaxel, trastuzumab and pertuzumab given every 3 weeks 6, completed 11/21/2014  (a) breast MRI after initial 3 cycles shows a significant response  (b) breast MRI after 6 cycles shows a complete radiologic response   (2) trastuzumab to be continued to complete one year (through mid-July 2017)  (a) echo 11/08/2014 shows an EF of 66%  (3) right lumpectomy with sentinel lymph node biopsy on 12/27/14 showed a residual  ypT2 ypN1a, stage IIB invasive ductal carcinoma, grade 2, with repeat estrogen and progesterone receptors again negative  (4) status post right oncoplastic surgery (with left reduction mammoplasty) 01/06/2015 showing in the right breast multiple foci of intralymphatic carcinoma in random sections  (5)  Right modified radical mastectomy 02/15/2014 showed no residual invasive disease; there was DCIS but  margins were negative; all 15 axillary lymph nodes were clear  (6)  Postmastectomy radiation to follow   (7) adjuvant capecitabine pending   PLAN: LJeani Hawkingdid well with her mastectomy and it is very favorable that her lymph nodes were clear. We still feel she would benefit from adjuvant radiation and once the drains are out as she will be meeting with Dr. KSondra Cometo discuss adjuvant radiation in more detail.  We are holding off on starting the capecitabine until her counts recover a little more on her drains are out. Possibly this will be the case as early as 3 weeks from now. She will see me that day and she already has Questran and will get some Imodium just in case she develops diarrhea from the capecitabine.  She will need a repeat echo sometime in April. She follows with Dr. DLoralie Champagneand we will make sure she has an appropriate visit with him.  Otherwise we discussed the fact that it is very favorable that there was no invasive disease left in her mastectomy specimen and also that all the lymph nodes were clear. She has had extensive staging that showed no evidence of metastatic disease. At this point she is in remission.  There was some question as to when she could return to work, but I think if she still has drains in place it is too early. Hopefully she can  return to work within the next month. I don't anticipate any significant restrictions.  Darlene [name] has a good understanding of this plan and she agrees with it. She will call with any problems that may develop before her next visit here.  Chauncey Cruel, MD   03/06/2015 9:00 AM

## 2015-03-06 NOTE — Patient Instructions (Signed)

## 2015-03-06 NOTE — Telephone Encounter (Signed)
Appointments made and avs printed °

## 2015-03-06 NOTE — Patient Instructions (Signed)
[facility] Discharge Instructions for Patients Receiving Chemotherapy  Today you received the following chemotherapy agents: Herceptin   To help prevent nausea and vomiting after your treatment, we encourage you to take your nausea medication as directed.    If you develop nausea and vomiting that is not controlled by your nausea medication, call the clinic.   BELOW ARE SYMPTOMS THAT SHOULD BE REPORTED IMMEDIATELY:  *FEVER GREATER THAN 100.5 F  *CHILLS WITH OR WITHOUT FEVER  NAUSEA AND VOMITING THAT IS NOT CONTROLLED WITH YOUR NAUSEA MEDICATION  *UNUSUAL SHORTNESS OF BREATH  *UNUSUAL BRUISING OR BLEEDING  TENDERNESS IN MOUTH AND THROAT WITH OR WITHOUT PRESENCE OF ULCERS  *URINARY PROBLEMS  *BOWEL PROBLEMS  UNUSUAL RASH Items with * indicate a potential emergency and should be followed up as soon as possible.  Feel free to call the clinic you have any questions or concerns. The clinic phone number is [phone].  Please show the CHEMO ALERT CARD at check-in to the Emergency Department and triage nurse.   

## 2015-03-07 ENCOUNTER — Ambulatory Visit: Payer: BLUE CROSS/BLUE SHIELD

## 2015-03-17 NOTE — Progress Notes (Addendum)
Location of Breast Cancer: right breast cancer  Histology per Pathology Report:   02/16/15 Diagnosis Breast, modified radical mastectomy , right - SCATTERED FOCI OF DUCTAL CARCINOMA IN SITU, HIGH GRADE - SCATTERED INTRALYMPHATIC TUMOR EMBOLI. - THE SURGICAL RESECTION MARGINS ARE NEGATIVE FOR CARCINOMA. - THERE IS NO EVIDENCE OF CARCINOMA IN 15 OF 15 LYMPH NODES (0/15). - SEE COMMENT.  01/06/15 Diagnosis 1. Breast, Mammoplasty, Left - BENIGN BREAST PARENCHYMA. - THERE IS NO EVIDENCE OF MALIGNANCY. 2. Breast, Mammoplasty, Right - INTRALYMPHATIC EMBOLI OF DUCTAL CARCINOMA. - SEE COMMENT.  12/27/14 Diagnosis 1. Breast, lumpectomy, Right - INVASIVE GRADE II DUCTAL CARCINOMA, ESTIMATED TO SPAN 4 CM IN GREATEST DIMENSION. - ASSOCIATED HIGH GRADE DUCTAL CARCINOMA IN SITU. - SEE COMMENT BENEATH ONCOLOGY TEMPLATE BELOW FOR MARGIN STATUS. - SEE ONCOLOGY TEMPLATE. 2. Lymph node, sentinel, biopsy, Right #1 - ONE LYMPH NODE POSITIVE FOR METASTATIC DUCTAL CARCINOMA (1/1). 3. Lymph node, sentinel, biopsy, Right #2 - ONE BENIGN LYMPH NODE WITH NO TUMOR SEEN (0/1). 4. Lymph node, sentinel, biopsy, Right #3 - ONE BENIGN LYMPH NODE WITH NO TUMOR SEEN (0/1).  07/19/14 Diagnosis 1. Breast, right, needle core biopsy, 10 o'clock - INVASIVE DUCTAL CARCINOMA, SEE COMMENT. - DUCTAL CARCINOMA IN SITU. 2. Lymph node, needle/core biopsy - INVASIVE MAMMARY CARCINOMA WITH LYMPHOCYTIC INFLAMMATION, SEE COMMENT.  Receptor Status: ER(0%), PR (0%), Her2-neu (positive)  Did patient present with symptoms (if so, please note symptoms) or was this found on screening mammography?: palpated a lump last June.  Past/Anticipated interventions by surgeon, if any: 12/27/14 - Procedure: BREAST LUMPECTOMY WITH RADIOACTIVE SEED AND SENTINEL LYMPH NODE BIOPSY; Surgeon: Stark Klein, MD; Location: [place]; Service: General; Laterality: Right; 01/06/15 - Procedure: BILATERAL BREAST REDUCTION, RIGHT  ONCOPLASTIC RECONSTRUCTION),LEFT BREAST REDUCTION FOR SYMMETRY; Surgeon: [name], MD; Location: [place]; Service: Plastics; Laterality: Bilateral, 02/15/15 -Procedure: RIGHT MODIFIED RADICAL MASTECTOMY;  Surgeon: Stark Klein, MD;  Location: [place];  Service: General;  Laterality: Right;   Past/Anticipated interventions by medical oncology, if any: neoadjuvant chemotherapy started 08/08/2014, consisting of carboplatin, docetaxel, trastuzumab and pertuzumab given every 3 weeks 6, completed 11/21/2014. trastuzumab to be continued to complete one year (through mid-July 2017). Possible capecitabine after restaging.  Lymphedema issues, if any: No    Pain issues, if any: no   GYNECOLOGIC HISTORY: Patient's last menstrual period was 10/22/2007. menarche age 89, the patient is GX P0. She went through the change of life in 2011. She did not take hormone replacement.   SAFETY ISSUES:  Prior radiation? no  Pacemaker/ICD? no  Possible current pregnancy?no  Is the patient on methotrexate? no  Current Complaints / other details: Patient is here with her husband.  She reports the drains where removed a week ago Tuesday from her right chest.  She is wondering when she should start Xeloda.  BP 158/56 mmHg  Pulse 51  Temp(Src) 97.8 F (36.6 C) (Oral)  Resp 18  Ht _0  (1.753 m)  Wt 205 lb 9.6 oz (93.26 kg)  BMI 30.35 kg/m2  LMP 10/22/2007

## 2015-03-22 ENCOUNTER — Ambulatory Visit
Admission: RE | Admit: 2015-03-22 | Discharge: 2015-03-22 | Disposition: A | Discharge status: Home / Self Care | Payer: BLUE CROSS/BLUE SHIELD | Source: Ambulatory Visit | Attending: Radiation Oncology | Admitting: Radiation Oncology

## 2015-03-22 ENCOUNTER — Encounter: Payer: Self-pay | Admitting: Radiation Oncology

## 2015-03-22 VITALS — BP 158/56 | HR 51 | Temp 97.8°F | Resp 18 | Ht 69.0 in | Wt 205.6 lb

## 2015-03-22 DIAGNOSIS — Z510 Encounter for antineoplastic radiation therapy: Secondary | ICD-10-CM | POA: Diagnosis not present

## 2015-03-22 DIAGNOSIS — C50411 Malignant neoplasm of upper-outer quadrant of right female breast: Secondary | ICD-10-CM

## 2015-03-22 NOTE — Progress Notes (Signed)
Please see the Nurse Progress Note in the MD Initial Consult Encounter for this patient. 

## 2015-03-22 NOTE — Progress Notes (Signed)
Radiation Oncology         [phone] ________________________________  Name: Darlene [name] MRN: [id number]  Date: 03/22/2015  DOB: 08/03/1950  Follow-Up Visit Note  CC: Thressa Sheller, MD  Magrinat, Virgie Dad, MD    ICD-9-CM ICD-10-CM   1. Breast cancer of upper-outer quadrant of right female breast ([place]) 174.4 C50.411     Diagnosis:   Breast cancer of the upper-outer quadrant of the right female breast ([place]). Stage IIB (yT2, N1a, cM0)  Narrative:  The patient returns today for routine follow-up.  She had a modified radical mastectomy of the right breast 02/15/2015, revealing DCIS with negative margins and 0/15 positive lymph nodes. She is here today with her husband. She reports the drains were removed a week ago Tuesday from her right chest. She is wondering when she should start Xeloda. She has some numbness on the back of her right arm. She started back at work 03/20/2015, where she mostly sits at her desk. She is here to plan her post-mastectomy radiation.  ALLERGIES:  is allergic to peanuts and tape.  Meds: Current Outpatient Prescriptions  Medication Sig Dispense Refill  . acetaminophen (TYLENOL) 325 MG tablet Take 650 mg by mouth 2 (two) times daily as needed for moderate pain or headache. Reported on 03/06/2015    . diphenhydrAMINE (BENADRYL) 25 MG tablet Take 25 mg by mouth daily as needed for allergies or sleep. Reported on 03/06/2015    . docusate sodium (COLACE) 50 MG capsule Take 50 mg by mouth as needed for mild constipation. Reported on 03/06/2015    . glucose blood (ONETOUCH VERIO) test strip 1 each by Other route as needed for other. Reported on 03/06/2015    . losartan (COZAAR) 100 MG tablet Take 1 tablet by mouth daily. Reported on 03/06/2015    . Melatonin 5 MG TABS Take 5 mg by mouth at bedtime. Reported on 03/06/2015    . Multiple Vitamin (MULTIVITAMIN WITH MINERALS) TABS tablet Take 1 tablet by mouth 2 (two) times daily. Reported on 03/06/2015    . NOVOLIN 70/30  (70-30) 100 UNIT/ML injection Inject 10-12 Units into the skin 2 (two) times daily with a meal. Reported on 03/06/2015    . ondansetron (ZOFRAN) 8 MG tablet TAKE 1 TABLET EVERY 8 HOURS AS NEEDED FOR NAUSEA AND VOMITING. 20 tablet 0  . Probiotic Product ([facility] PROBIOTIC PO) Take 1 capsule by mouth daily. Reported on 03/06/2015    . Trastuzumab (HERCEPTIN IV) Inject into the vein. Reported on 03/06/2015    . Vortioxetine HBr (TRINTELLIX) 20 MG TABS Take 20 mg by mouth daily. Reported on 03/06/2015    . capecitabine (XELODA) 500 MG tablet Take 2 tablets by mouth after a meal 2 times a day on radiation days; do not take on days when you do not receive radiation (Patient not taking: Reported on 03/22/2015) 150 tablet 4  . cholestyramine (QUESTRAN) 4 g packet Take 4 g by mouth 2 (two) times daily. Reported on 03/22/2015    . diphenoxylate-atropine (LOMOTIL) 2.5-0.025 MG tablet Take 1 tablet by mouth 4 (four) times daily as needed for diarrhea or loose stools. Reported on 03/22/2015     No current facility-administered medications for this encounter.    Physical Findings: The patient is in no acute distress. Patient is alert and oriented.  height is 5\' 9"  (1.753 m) and weight is 205 lb 9.6 oz (93.26 kg). Her oral temperature is 97.8 F (36.6 C). Her blood pressure is 158/56 and her pulse  is 51. Her respiration is 18.   Her right chest shows a mastectomy scar, healing well. No signs of drainage or infection.  Lab Findings: Lab Results  Component Value Date   WBC 5.2 03/06/2015   HGB 8.9* 03/06/2015   HCT 27.3* 03/06/2015   MCV 86.0 03/06/2015   PLT 191 03/06/2015    Radiographic Findings: No results found.  Impression:  The patient is doing well since I last saw her, 01/25/2015, and is recovering from the surgery. She is a good candidate for post-op radiation.  Plan:  I spoke to the patient today regarding her diagnosis and options for treatment. We discussed the process of simulation and the placement  tattoos. We discussed 6 weeks of treatment as an outpatient. We discussed the possibility of asymptomatic lung damage. We discussed the low likelihood of secondary malignancies. We discussed the possible side effects including but not limited to skin redness, fatigue, permanent skin darkening, and breast swelling. This procedure has been fully reviewed with the patient and written informed consent has been obtained.  She has an appointment with Dr. Jana Hakim 03/27/2015. Her planning appointment is scheduled 03/28/2015 at 11 am.  ____________________________________  Blair Promise, PhD, MD    This document serves as a record of services personally performed by Gery Pray, MD. It was created on his behalf by Lendon Collar, a trained medical scribe. The creation of this record is based on the scribe's personal observations and the provider's statements to them. This document has been checked and approved by the attending provider.

## 2015-03-27 ENCOUNTER — Ambulatory Visit (HOSPITAL_BASED_OUTPATIENT_CLINIC_OR_DEPARTMENT_OTHER): Payer: BLUE CROSS/BLUE SHIELD

## 2015-03-27 ENCOUNTER — Other Ambulatory Visit: Payer: BLUE CROSS/BLUE SHIELD

## 2015-03-27 ENCOUNTER — Ambulatory Visit: Payer: BLUE CROSS/BLUE SHIELD

## 2015-03-27 ENCOUNTER — Other Ambulatory Visit (HOSPITAL_BASED_OUTPATIENT_CLINIC_OR_DEPARTMENT_OTHER): Payer: BLUE CROSS/BLUE SHIELD

## 2015-03-27 ENCOUNTER — Telehealth: Payer: Self-pay | Admitting: Oncology

## 2015-03-27 ENCOUNTER — Encounter: Payer: Self-pay | Admitting: *Deleted

## 2015-03-27 ENCOUNTER — Ambulatory Visit (HOSPITAL_BASED_OUTPATIENT_CLINIC_OR_DEPARTMENT_OTHER): Payer: BLUE CROSS/BLUE SHIELD | Admitting: Oncology

## 2015-03-27 VITALS — BP 136/58 | HR 62 | Temp 98.6°F | Resp 18 | Ht 69.0 in | Wt 205.5 lb

## 2015-03-27 DIAGNOSIS — C773 Secondary and unspecified malignant neoplasm of axilla and upper limb lymph nodes: Secondary | ICD-10-CM | POA: Diagnosis not present

## 2015-03-27 DIAGNOSIS — Z5112 Encounter for antineoplastic immunotherapy: Secondary | ICD-10-CM | POA: Diagnosis not present

## 2015-03-27 DIAGNOSIS — C50411 Malignant neoplasm of upper-outer quadrant of right female breast: Secondary | ICD-10-CM

## 2015-03-27 DIAGNOSIS — Z171 Estrogen receptor negative status [ER-]: Secondary | ICD-10-CM | POA: Diagnosis not present

## 2015-03-27 DIAGNOSIS — Z95828 Presence of other vascular implants and grafts: Secondary | ICD-10-CM

## 2015-03-27 LAB — COMPREHENSIVE METABOLIC PANEL
ALT: 33 U/L (ref 0–55)
AST: 26 U/L (ref 5–34)
Albumin: 3.4 g/dL — ABNORMAL LOW (ref 3.5–5.0)
Alkaline Phosphatase: 75 U/L (ref 40–150)
Anion Gap: 9 mEq/L (ref 3–11)
BUN: 36.5 mg/dL — ABNORMAL HIGH (ref 7.0–26.0)
CO2: 25 mEq/L (ref 22–29)
Calcium: 9 mg/dL (ref 8.4–10.4)
Chloride: 108 mEq/L (ref 98–109)
Creatinine: 1.3 mg/dL — ABNORMAL HIGH (ref 0.6–1.1)
EGFR: 46 mL/min/{1.73_m2} — ABNORMAL LOW (ref >90)
Glucose: 167 mg/dl — ABNORMAL HIGH (ref 70–140)
Potassium: 4.3 mEq/L (ref 3.5–5.1)
Sodium: 141 mEq/L (ref 136–145)
Total Bilirubin: 0.45 mg/dL (ref 0.20–1.20)
Total Protein: 6.3 g/dL — ABNORMAL LOW (ref 6.4–8.3)

## 2015-03-27 LAB — CBC WITH DIFFERENTIAL/PLATELET
BASO%: 0.1 % (ref 0.0–2.0)
Basophils Absolute: 0 10*3/uL (ref 0.0–0.1)
EOS%: 2 % (ref 0.0–7.0)
Eosinophils Absolute: 0.1 10*3/uL (ref 0.0–0.5)
HCT: 29.8 % — ABNORMAL LOW (ref 34.8–46.6)
HGB: 9.6 g/dL — ABNORMAL LOW (ref 11.6–15.9)
LYMPH%: 23.6 % (ref 14.0–49.7)
MCH: 28 pg (ref 25.1–34.0)
MCHC: 32.2 g/dL (ref 31.5–36.0)
MCV: 86.9 fL (ref 79.5–101.0)
MONO#: 0.4 10*3/uL (ref 0.1–0.9)
MONO%: 6 % (ref 0.0–14.0)
NEUT#: 4.9 10*3/uL (ref 1.5–6.5)
NEUT%: 68.3 % (ref 38.4–76.8)
Platelets: 208 10*3/uL (ref 145–400)
RBC: 3.43 10*6/uL — ABNORMAL LOW (ref 3.70–5.45)
RDW: 15.7 % — ABNORMAL HIGH (ref 11.2–14.5)
WBC: 7.2 10*3/uL (ref 3.9–10.3)
lymph#: 1.7 10*3/uL (ref 0.9–3.3)

## 2015-03-27 MED ORDER — METOCLOPRAMIDE HCL 5 MG PO TABS: 5.0000 mg | ORAL_TABLET | Freq: Three times a day (TID) | ORAL | Status: DC

## 2015-03-27 MED ORDER — DIPHENHYDRAMINE HCL 25 MG PO CAPS
25.0000 mg | ORAL_CAPSULE | Freq: Once | ORAL | Status: AC
  Administered 2015-03-27: 25 mg via ORAL

## 2015-03-27 MED ORDER — SODIUM CHLORIDE 0.9 % IJ SOLN
10.0000 mL | INTRAMUSCULAR | Status: DC | PRN
  Administered 2015-03-27: 10 mL
  Filled 2015-03-27: qty 10

## 2015-03-27 MED ORDER — TRASTUZUMAB CHEMO INJECTION 440 MG
6.0000 mg/kg | Freq: Once | INTRAVENOUS | Status: AC
  Administered 2015-03-27: 567 mg via INTRAVENOUS
  Filled 2015-03-27: qty 27

## 2015-03-27 MED ORDER — ACETAMINOPHEN 325 MG PO TABS
650.0000 mg | ORAL_TABLET | Freq: Once | ORAL | Status: AC
  Administered 2015-03-27: 650 mg via ORAL

## 2015-03-27 MED ORDER — ACETAMINOPHEN 325 MG PO TABS
ORAL_TABLET | ORAL | Status: AC
  Filled 2015-03-27: qty 2

## 2015-03-27 MED ORDER — HEPARIN SOD (PORK) LOCK FLUSH 100 UNIT/ML IV SOLN
500.0000 [IU] | Freq: Once | INTRAVENOUS | Status: AC | PRN
  Administered 2015-03-27: 500 [IU]
  Filled 2015-03-27: qty 5

## 2015-03-27 MED ORDER — SODIUM CHLORIDE 0.9 % IV SOLN
Freq: Once | INTRAVENOUS | Status: AC
  Administered 2015-03-27: 11:00:00 via INTRAVENOUS

## 2015-03-27 MED ORDER — SODIUM CHLORIDE 0.9% FLUSH
10.0000 mL | INTRAVENOUS | Status: DC | PRN
  Administered 2015-03-27: 10 mL via INTRAVENOUS
  Filled 2015-03-27: qty 10

## 2015-03-27 MED ORDER — DIPHENHYDRAMINE HCL 25 MG PO CAPS
ORAL_CAPSULE | ORAL | Status: AC
  Filled 2015-03-27: qty 1

## 2015-03-27 NOTE — Patient Instructions (Signed)

## 2015-03-27 NOTE — Telephone Encounter (Signed)
appt made and avs printed °

## 2015-03-27 NOTE — Progress Notes (Signed)
[facility]  Telephone:([phone] Fax:(336) [phone]     ID: Darlene WESENBERG DOB: February 11, 1950  MR#: [id number]  ZMO#:[id number]  Patient Care Team: Thressa Sheller, MD as PCP - General (Internal Medicine) Bryson Ha Himmelrich, RD as Dietitian (Bariatrics) Stark Klein, MD as Consulting Physician (General Surgery) Chauncey Cruel, MD as Consulting Physician (Oncology) Arloa Koh, MD as Consulting Physician (Radiation Oncology) Mauro Kaufmann, RN as Registered Nurse Rockwell Germany, RN as Registered Nurse Jacelyn Pi, MD as Consulting Physician (Endocrinology) PCP: Thressa Sheller, MD GYN: Kem Boroughs FNP OTHER MD:  CHIEF COMPLAINT:  HER-2 positive, estrogen receptor negative breast cancer  CURRENT TREATMENT:  anti-HER-2 treatment; adjuvant radiation and capecitabine pending  BREAST CANCER HISTORY: From the original intake note:  "Darlene [name]" had screening mammography at her gynecologist suggestive of a possible abnormality in the right breast. However right diagnostic mammogram 04/12/2013 at [facility] was negative. More recently however, she noted a lump in her right breast and brought it to her gynecologist's attention. On 07/18/2014 the patient had bilateral diagnostic mammography with tomosynthesis at [facility]. The breast density was category B. In the right breast upper outer quadrant there was an area of focal asymmetry with indistinct margins.[name] Ultrasound was obtained and showed in addition to the mass in question, measuring 1.7 cm, an abnormal-appearing lymph node in the right axillary tail.  On 07/19/2014 the patient underwent biopsy of the right breast massin question as well as a right axillary lymph node. Both were positive for invasive ductal carcinoma, grade 2, estrogen and progesterone receptor negative, with an MIB-1 of 90%, and HER-2 pending. Incidentally the lymph node biopsy showed a lymphocytic inflammatory component but no lymph node architecture.  Her  subsequent history is as detailed below.  INTERVAL HISTORY:  Darlene [name] returns today for follow-up of her locally a locally advanced breast cancer, accompanied by her husband [name]. She continues on Herceptin, which she tolerates well. She is going to be starting capecitabine as soon as she begins her radiation treatments, likely next week. She understands to take that 2 tablets twice a day on radiation days only.   REVIEW OF SYSTEMS: Darlene [name] is working on her right upper extremity range of motion, and that is improving. She was able to work a full week last week, full-time, and still had NAD left over in the evening. She has a little bit of arthritis here and there but this is not more persistent or intense than before. She has a little bit of gastric atony and also problems with motility in terms of bowel movements. It is not exactly constipation. The stool is "fair". Just doesn't "1,". It is not particularly hard. A detailed review of systems today was otherwise stable  PAST MEDICAL HISTORY: Past Medical History  Diagnosis Date  . Obesity   . Hypertension   . Hyperlipidemia   . Diabetes mellitus without complication ([place])     03+ years  . Eating disorder     binge eating  . Balance problem   . Arthritis     in fingers, shoulders  . Anemia     hx of  . Depression   . Ovarian cyst rupture     possible  . Clumsiness   . Breast cancer of upper-outer quadrant of right female breast ([place]) 07/22/2014  . Breast cancer ([place])   . Anxiety   . Pneumonia   . Neuromuscular disorder ([place])     diabetic neuropathy  . Obstructive sleep apnea on CPAP  uses cpap nightly  . Heart murmur     PAST SURGICAL HISTORY: Past Surgical History  Procedure Laterality Date  . Carpal tunnel release Right   . Carpal tunnel release Left   . Breath tek h pylori N/A 09/15/2012    Procedure: BREATH TEK H PYLORI;  Surgeon: Pedro Earls, MD;  Location: [facility];  Service: General;  Laterality: N/A;  .  Dupuytren contracture release    . Trigger finger release      x3  . Toe surgery  1970s    bone spur  . Rotator cuff repair Right 2005  . Gastric roux-en-y N/A 11/02/2012    Procedure: LAPAROSCOPIC ROUX-EN-Y GASTRIC;  Surgeon: Pedro Earls, MD;  Location: [facility];  Service: General;  Laterality: N/A;  . Bunionectomy Left 12/2013  . Portacath placement Left 08/02/2014    Procedure: INSERTION PORT-A-CATH;  Surgeon: Stark Klein, MD;  Location: [facility];  Service: General;  Laterality: Left;  . Breast lumpectomy with radioactive seed and sentinel lymph node biopsy Right 12/27/2014    Procedure: BREAST LUMPECTOMY WITH RADIOACTIVE SEED AND SENTINEL LYMPH NODE BIOPSY;  Surgeon: Stark Klein, MD;  Location: [facility];  Service: General;  Laterality: Right;  . Breast reduction surgery Bilateral 01/06/2015    Procedure: BILATERAL BREAST REDUCTION, RIGHT ONCOPLASTIC RECONSTRUCTION),LEFT BREAST REDUCTION FOR SYMMETRY;  Surgeon: [name], MD;  Location: [facility];  Service: Plastics;  Laterality: Bilateral;  . Mastectomy Right 02/15/2015    modified  . Modified mastectomy Right 02/15/2015    Procedure: RIGHT MODIFIED RADICAL MASTECTOMY;  Surgeon: Stark Klein, MD;  Location: [facility];  Service: General;  Laterality: Right;    FAMILY HISTORY Family History  Problem Relation Age of Onset  . CVA Mother   . Diabetes Father   . Heart failure Father   . Hypertension Sister   . Diabetes Brother   . Diabetes Paternal Uncle   . Breast cancer Paternal Grandmother    the patient's father died from complications of diabetes at age 6. The patient's mother died at age 79 with a subarachnoid hemorrhage. The patient had one brother, one sister. The only breast cancer was the patient's paternal grandmother diagnosed in her 73s. There is no history of ovarian cancer in the family.  GYNECOLOGIC HISTORY:  Patient's last menstrual period was 10/22/2007.  menarche age 20, the  patient is GX P0. She went through the change of life in 2011. She did not take hormone replacement.  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 Her husband [name] is a retired Visual merchandiser (he taught at [employer]).  [name] has 2 children from an earlier marriage, Rockey Situ who teaches in [place], and Shereen Marton,  who works at the [employer] in in [place]. He has 1 grand son, aged 49. The patient and her husband are not church attender's    ADVANCED DIRECTIVES:  Not in place   HEALTH MAINTENANCE: Social History  Substance Use Topics  . Smoking status: Never Smoker   . Smokeless tobacco: Never Used  . Alcohol Use: 0.0 oz/week    0 Standard drinks or equivalent per week     Comment: social     Colonoscopy:  May 2016  PAP:  Bone density: 07/05/2013 normal, with a T score of 0.4  Lipid panel:  Allergies  Allergen Reactions  . Peanuts [Peanut Oil] Itching    "Large amounts"  .  Tape Hives and Rash    Current Outpatient Prescriptions  Medication Sig Dispense Refill  . acetaminophen (TYLENOL) 325 MG tablet Take 650 mg by mouth 2 (two) times daily as needed for moderate pain or headache. Reported on 03/06/2015    . capecitabine (XELODA) 500 MG tablet Take 2 tablets by mouth after a meal 2 times a day on radiation days; do not take on days when you do not receive radiation (Patient not taking: Reported on 03/22/2015) 150 tablet 4  . cholestyramine (QUESTRAN) 4 g packet Take 4 g by mouth 2 (two) times daily. Reported on 03/22/2015    . diphenhydrAMINE (BENADRYL) 25 MG tablet Take 25 mg by mouth daily as needed for allergies or sleep. Reported on 03/06/2015    . glucose blood (ONETOUCH VERIO) test strip 1 each by Other route as needed for other. Reported on 03/06/2015    . losartan (COZAAR) 100 MG tablet Take 1 tablet by mouth daily. Reported on 03/06/2015    . Melatonin 5 MG TABS Take 5 mg by mouth at  bedtime. Reported on 03/06/2015    . Multiple Vitamin (MULTIVITAMIN WITH MINERALS) TABS tablet Take 1 tablet by mouth 2 (two) times daily. Reported on 03/06/2015    . NOVOLIN 70/30 (70-30) 100 UNIT/ML injection Inject 10-12 Units into the skin 2 (two) times daily with a meal. Reported on 03/06/2015    . Probiotic Product ([facility] PROBIOTIC PO) Take 1 capsule by mouth daily. Reported on 03/06/2015    . Trastuzumab (HERCEPTIN IV) Inject into the vein. Reported on 03/06/2015    . Vortioxetine HBr (TRINTELLIX) 20 MG TABS Take 20 mg by mouth daily. Reported on 03/06/2015     No current facility-administered medications for this visit.   Facility-Administered Medications Ordered in Other Visits  Medication Dose Route Frequency Provider Last Rate Last Dose  . sodium chloride flush (NS) 0.9 % injection 10 mL  10 mL Intravenous PRN Chauncey Cruel, MD   10 mL at 03/27/15 0841    OBJECTIVE:  Middle-aged white woman In no acute distress Filed Vitals:   03/27/15 0916  BP: 136/58  Pulse: 62  Temp: 98.6 F (37 C)  Resp: 18     Body mass index is 30.33 kg/(m^2).    ECOG FS:1 - Symptomatic but completely ambulatory  Sclerae unicteric, EOMs intact Oropharynx clear, dentition in good repair No cervical or supraclavicular adenopathy Lungs no rales or rhonchi Heart regular rate and rhythm Abd soft, nontender, positive bowel sounds MSK no focal spinal tenderness, no upper extremity lymphedema Neuro: nonfocal, well oriented, appropriate affect Breasts: The right breast is status post mastectomy. There is no evidence of chest wall recurrence. The right axilla is benign. Left breast is unremarkable.   LAB RESULTS:  CMP     Component Value Date/Time   NA 141 03/27/2015 0833   NA 137 02/16/2015 0418   K 4.3 03/27/2015 0833   K 4.3 02/16/2015 0418   CL 107 02/16/2015 0418   CO2 25 03/27/2015 0833   CO2 23 02/16/2015 0418   GLUCOSE 167* 03/27/2015 0833   GLUCOSE 218* 02/16/2015 0418   BUN 36.5*  03/27/2015 0833   BUN 18 02/16/2015 0418   CREATININE 1.3* 03/27/2015 0833   CREATININE 1.20* 02/16/2015 0418   CREATININE 1.10 02/18/2013 0827   CALCIUM 9.0 03/27/2015 0833   CALCIUM 8.4* 02/16/2015 0418   PROT 6.3* 03/27/2015 0833   PROT 6.0* 12/18/2014 1540   ALBUMIN 3.4* 03/27/2015 0833   ALBUMIN 3.6  12/18/2014 1540   AST 26 03/27/2015 0833   AST 21 12/18/2014 1540   ALT 33 03/27/2015 0833   ALT 22 12/18/2014 1540   ALKPHOS 75 03/27/2015 0833   ALKPHOS 73 12/18/2014 1540   BILITOT 0.45 03/27/2015 0833   BILITOT 1.0 12/18/2014 1540   GFRNONAA 47* 02/16/2015 0418   GFRNONAA 55* 02/18/2013 0827   GFRAA 55* 02/16/2015 0418   GFRAA 63 02/18/2013 0827    INo results found for: SPEP, UPEP  Lab Results  Component Value Date   WBC 7.2 03/27/2015   NEUTROABS 4.9 03/27/2015   HGB 9.6* 03/27/2015   HCT 29.8* 03/27/2015   MCV 86.9 03/27/2015   PLT 208 03/27/2015      Chemistry      Component Value Date/Time   NA 141 03/27/2015 0833   NA 137 02/16/2015 0418   K 4.3 03/27/2015 0833   K 4.3 02/16/2015 0418   CL 107 02/16/2015 0418   CO2 25 03/27/2015 0833   CO2 23 02/16/2015 0418   BUN 36.5* 03/27/2015 0833   BUN 18 02/16/2015 0418   CREATININE 1.3* 03/27/2015 0833   CREATININE 1.20* 02/16/2015 0418   CREATININE 1.10 02/18/2013 0827      Component Value Date/Time   CALCIUM 9.0 03/27/2015 0833   CALCIUM 8.4* 02/16/2015 0418   ALKPHOS 75 03/27/2015 0833   ALKPHOS 73 12/18/2014 1540   AST 26 03/27/2015 0833   AST 21 12/18/2014 1540   ALT 33 03/27/2015 0833   ALT 22 12/18/2014 1540   BILITOT 0.45 03/27/2015 0833   BILITOT 1.0 12/18/2014 1540       No results found for: LABCA2  No components found for: [id number]  No results for input(s): INR in the last 168 hours.  Urinalysis    Component Value Date/Time   COLORURINE YELLOW 12/18/2014 1635   APPEARANCEUR CLEAR 12/18/2014 1635   LABSPEC 1.023 12/18/2014 1635   PHURINE 5.0 12/18/2014 1635   GLUCOSEU  NEGATIVE 12/18/2014 1635   HGBUR NEGATIVE 12/18/2014 1635   BILIRUBINUR NEGATIVE 12/18/2014 1635   BILIRUBINUR neg 06/29/2013 [place] 12/18/2014 1635   PROTEINUR 30* 12/18/2014 1635   PROTEINUR trace 06/29/2013 1017   UROBILINOGEN negative 06/29/2013 1017   NITRITE NEGATIVE 12/18/2014 1635   NITRITE neg 06/29/2013 1017   LEUKOCYTESUR NEGATIVE 12/18/2014 1635    STUDIES: No results found.  ASSESSMENT: 65 y.o. [place] woman status post right breast and right axillary lymph node biopsy 07/19/2014 for a cT2  pN1, stage IIB  invasive ductal carcinoma, grade 2, estrogen and progesterone receptor negative, with an MIB-1 of 90%, and HER-2 amplified  (1) neoadjuvant chemotherapy started 08/08/2014, consisting of carboplatin, docetaxel, trastuzumab and pertuzumab given every 3 weeks 6, completed 11/21/2014  (a) breast MRI after initial 3 cycles shows a significant response  (b) breast MRI after 6 cycles shows a complete radiologic response   (2) trastuzumab to be continued to complete one year (through mid-July 2017)  (a) echo 02/13/2015 shows an EF of 60-65%  (3) right lumpectomy with sentinel lymph node biopsy on 12/27/14 showed a residual  ypT2 ypN1a, stage IIB invasive ductal carcinoma, grade 2, with repeat estrogen and progesterone receptors again negative  (4) status post right oncoplastic surgery (with left reduction mammoplasty) 01/06/2015 showing in the right breast multiple foci of intralymphatic carcinoma in random sections  (5)  Right modified radical mastectomy 02/15/2014 showed no residual invasive disease; there was DCIS but margins were negative; all 15 axillary lymph nodes  were clear  (6)  Postmastectomy radiation to follow   (7) adjuvant capecitabine pending   PLAN: Darlene [name] will likely start her radiation next week. We are going to wait on the capecitabine until she actually starts radiation. She will receive 2 tablets twice daily only on radiation  days.  When she completes the radiation we will increase the dose to 3 tablets twice daily and that will be 1 week on and one-week off.  She has a good understanding of the possible toxicities, side effects and complications of capecitabine. These certainly include diarrhea. However right now she is having peristalsis problems. I told her to not take Zofran for her nausea but instead switch to metoclopramide 5 mg to take 3 times a day before meals. I think that will be helpful to her in general with her digestion and motility issues.  I went ahead and put in all her Herceptin orders through mid-July which is when she will be completing those. She is really scheduled for an echocardiogram in mid April.  She will see [name] again in 3 weeks. At that time we will make sure she is tolerating the capecitabine well and is taking it correctly.  Chauncey Cruel, MD   03/27/2015 10:03 AM

## 2015-03-27 NOTE — Patient Instructions (Signed)
[facility] Discharge Instructions for Patients Receiving Chemotherapy  Today you received the following chemotherapy agents: Herceptin   To help prevent nausea and vomiting after your treatment, we encourage you to take your nausea medication as directed.    If you develop nausea and vomiting that is not controlled by your nausea medication, call the clinic.   BELOW ARE SYMPTOMS THAT SHOULD BE REPORTED IMMEDIATELY:  *FEVER GREATER THAN 100.5 F  *CHILLS WITH OR WITHOUT FEVER  NAUSEA AND VOMITING THAT IS NOT CONTROLLED WITH YOUR NAUSEA MEDICATION  *UNUSUAL SHORTNESS OF BREATH  *UNUSUAL BRUISING OR BLEEDING  TENDERNESS IN MOUTH AND THROAT WITH OR WITHOUT PRESENCE OF ULCERS  *URINARY PROBLEMS  *BOWEL PROBLEMS  UNUSUAL RASH Items with * indicate a potential emergency and should be followed up as soon as possible.  Feel free to call the clinic you have any questions or concerns. The clinic phone number is [phone].  Please show the CHEMO ALERT CARD at check-in to the Emergency Department and triage nurse.   

## 2015-03-28 ENCOUNTER — Ambulatory Visit
Admission: RE | Admit: 2015-03-28 | Discharge: 2015-03-28 | Disposition: A | Discharge status: Home / Self Care | Payer: BLUE CROSS/BLUE SHIELD | Source: Ambulatory Visit | Attending: Radiation Oncology | Admitting: Radiation Oncology

## 2015-03-28 ENCOUNTER — Ambulatory Visit: Payer: BLUE CROSS/BLUE SHIELD

## 2015-03-28 DIAGNOSIS — C50411 Malignant neoplasm of upper-outer quadrant of right female breast: Secondary | ICD-10-CM

## 2015-03-28 DIAGNOSIS — Z510 Encounter for antineoplastic radiation therapy: Secondary | ICD-10-CM | POA: Diagnosis not present

## 2015-03-29 NOTE — Progress Notes (Signed)
  Radiation Oncology         [phone] ________________________________  Name: Darlene [name] MRN: [id number]  Date: 03/28/2015  DOB: April 03, 1950  SIMULATION AND TREATMENT PLANNING NOTE    ICD-9-CM ICD-10-CM   1. Breast cancer of upper-outer quadrant of right female breast ([place]) 174.4 C50.411     DIAGNOSIS:  ypT2 ypN1a, stage IIB invasive ductal carcinoma, grade 2  NARRATIVE:  The patient was brought to the [facility].  Identity was confirmed.  All relevant records and images related to the planned course of therapy were reviewed.  The patient freely provided informed written consent to proceed with treatment after reviewing the details related to the planned course of therapy. The consent form was witnessed and verified by the simulation staff.  Then, the patient was set-up in a stable reproducible  supine position for radiation therapy.  CT images were obtained.  Surface markings were placed.  The CT images were loaded into the planning software.  Then the target and avoidance structures were contoured.  Treatment planning then occurred.  The radiation prescription was entered and confirmed.  Then, I designed and supervised the construction of a total of 3 medically necessary complex treatment devices.  I have requested : 3D Simulation  I have requested a DVH of the following structures: heart, lungs, esophagus.  I have ordered:dose calc.  PLAN:  The patient will receive 50.4 Gy in 28 fractions directed at the right chest wall area followed by a boost to the mastectomy scar for a cumulative dose to this area of 60.4 gray.  ________________________________  Special treatment procedure note:   the patient will be receiving adjuvant capecitabine during the course of her chest wall radiation therapy. Given the increased potential for toxicities as well as the necessity for close monitoring of the patient and bloodwork, this constitutes a special treatment  procedure -----------------------------------  Blair Promise, PhD, MD

## 2015-03-30 DIAGNOSIS — Z510 Encounter for antineoplastic radiation therapy: Secondary | ICD-10-CM | POA: Diagnosis not present

## 2015-04-04 ENCOUNTER — Encounter: Payer: Self-pay | Admitting: Pharmacist

## 2015-04-04 ENCOUNTER — Ambulatory Visit
Admission: RE | Admit: 2015-04-04 | Discharge: 2015-04-04 | Disposition: A | Discharge status: Home / Self Care | Payer: BLUE CROSS/BLUE SHIELD | Source: Ambulatory Visit | Attending: Radiation Oncology | Admitting: Radiation Oncology

## 2015-04-04 DIAGNOSIS — Z9884 Bariatric surgery status: Secondary | ICD-10-CM

## 2015-04-04 DIAGNOSIS — Z510 Encounter for antineoplastic radiation therapy: Secondary | ICD-10-CM | POA: Diagnosis not present

## 2015-04-04 DIAGNOSIS — C50411 Malignant neoplasm of upper-outer quadrant of right female breast: Secondary | ICD-10-CM

## 2015-04-04 NOTE — Progress Notes (Signed)
3/14: Ms. Grieger is ready to start Xeloda with radiation tomorrow on 3/15. Spoke with patient again today and answered all questions. Will follow up in 1 week. Pt knows to take 2 tablets twice daily M-F on days she receives radiation only. [facility].    Thank you,  Montel Clock, PharmD, [facility]

## 2015-04-04 NOTE — Progress Notes (Signed)
  Radiation Oncology         [phone] ________________________________  Name: Darlene [name] MRN: [id number]  Date: 04/04/2015  DOB: Jul 01, 1950  Simulation Verification Note    ICD-9-CM ICD-10-CM   1. Breast cancer of upper-outer quadrant of right female breast ([place]) 174.4 C50.411   2. Lap Callie Fielding Gastric Bypass Oct 2014 V45.86 Z98.84     Status: outpatient  NARRATIVE: The patient was brought to the treatment unit and placed in the planned treatment position. The clinical setup was verified. Then port films were obtained and uploaded to the radiation oncology medical record software.  The treatment beams were carefully compared against the planned radiation fields. The position location and shape of the radiation fields was reviewed. They targeted volume of tissue appears to be appropriately covered by the radiation beams. Organs at risk appear to be excluded as planned.  Based on my personal review, I approved the simulation verification. The patient's treatment will proceed as planned.  -----------------------------------  Blair Promise, PhD, MD

## 2015-04-05 ENCOUNTER — Ambulatory Visit
Admission: RE | Admit: 2015-04-05 | Discharge: 2015-04-05 | Disposition: A | Discharge status: Home / Self Care | Payer: BLUE CROSS/BLUE SHIELD | Source: Ambulatory Visit | Attending: Radiation Oncology | Admitting: Radiation Oncology

## 2015-04-05 ENCOUNTER — Ambulatory Visit: Payer: BLUE CROSS/BLUE SHIELD

## 2015-04-05 DIAGNOSIS — C50411 Malignant neoplasm of upper-outer quadrant of right female breast: Secondary | ICD-10-CM

## 2015-04-05 DIAGNOSIS — Z510 Encounter for antineoplastic radiation therapy: Secondary | ICD-10-CM | POA: Diagnosis not present

## 2015-04-05 MED ORDER — ALRA NON-METALLIC DEODORANT (RAD-ONC)
1.0000 "application " | Freq: Once | TOPICAL | Status: AC
  Administered 2015-04-05: 1 via TOPICAL

## 2015-04-05 MED ORDER — RADIAPLEXRX EX GEL
Freq: Once | CUTANEOUS | Status: AC
  Administered 2015-04-05: 16:00:00 via TOPICAL

## 2015-04-05 NOTE — Progress Notes (Signed)
Pt here for patient teaching.  Pt given Radiation and You booklet, skin care instructions, Alra deodorant and Radiaplex gel. Pt reports they have not watched the Radiation Therapy Education video and has been given the link to watch at home. Reviewed areas of pertinence such as fatigue, skin changes, breast tenderness and breast swelling . Pt able to give teach back of to pat skin and use unscented/gentle soap,apply Radiaplex bid and avoid applying anything to skin within 4 hours of treatment. Pt demonstrated understanding and verbalizes understanding of information given and will contact nursing with any questions or concerns.     [email or web address]      

## 2015-04-06 ENCOUNTER — Ambulatory Visit
Admission: RE | Admit: 2015-04-06 | Discharge: 2015-04-06 | Disposition: A | Discharge status: Home / Self Care | Payer: BLUE CROSS/BLUE SHIELD | Source: Ambulatory Visit | Attending: Radiation Oncology | Admitting: Radiation Oncology

## 2015-04-06 ENCOUNTER — Ambulatory Visit: Payer: BLUE CROSS/BLUE SHIELD

## 2015-04-06 DIAGNOSIS — Z510 Encounter for antineoplastic radiation therapy: Secondary | ICD-10-CM | POA: Diagnosis not present

## 2015-04-07 ENCOUNTER — Ambulatory Visit
Admission: RE | Admit: 2015-04-07 | Discharge: 2015-04-07 | Disposition: A | Discharge status: Home / Self Care | Payer: BLUE CROSS/BLUE SHIELD | Source: Ambulatory Visit | Attending: Radiation Oncology | Admitting: Radiation Oncology

## 2015-04-07 DIAGNOSIS — Z510 Encounter for antineoplastic radiation therapy: Secondary | ICD-10-CM | POA: Diagnosis not present

## 2015-04-10 ENCOUNTER — Ambulatory Visit
Admission: RE | Admit: 2015-04-10 | Discharge: 2015-04-10 | Disposition: A | Discharge status: Home / Self Care | Payer: BLUE CROSS/BLUE SHIELD | Source: Ambulatory Visit | Attending: Radiation Oncology | Admitting: Radiation Oncology

## 2015-04-10 DIAGNOSIS — Z510 Encounter for antineoplastic radiation therapy: Secondary | ICD-10-CM | POA: Diagnosis not present

## 2015-04-11 ENCOUNTER — Ambulatory Visit
Admission: RE | Admit: 2015-04-11 | Discharge: 2015-04-11 | Disposition: A | Discharge status: Home / Self Care | Payer: BLUE CROSS/BLUE SHIELD | Source: Ambulatory Visit | Attending: Radiation Oncology | Admitting: Radiation Oncology

## 2015-04-11 ENCOUNTER — Encounter: Payer: Self-pay | Admitting: Radiation Oncology

## 2015-04-11 VITALS — BP 150/65 | HR 65 | Temp 98.0°F | Ht 69.0 in | Wt 210.9 lb

## 2015-04-11 DIAGNOSIS — Z510 Encounter for antineoplastic radiation therapy: Secondary | ICD-10-CM | POA: Diagnosis not present

## 2015-04-11 DIAGNOSIS — C50411 Malignant neoplasm of upper-outer quadrant of right female breast: Secondary | ICD-10-CM

## 2015-04-11 NOTE — Progress Notes (Signed)
  Radiation Oncology         [phone] ________________________________  Name: Darlene [name] MRN: [id number]  Date: 04/11/2015  DOB: 04-30-1950  Weekly Radiation Therapy Management    ICD-9-CM ICD-10-CM   1. Breast cancer of upper-outer quadrant of right female breast ([place]) 174.4 C50.411      Current Dose: 9 Gy     Planned Dose:  60.4 Gy  Narrative . . . . . . . . The patient presents for routine under treatment assessment.                                   Darlene [name] has completed 5 fractions to her right breast. She denies having pain. She is taking xeloda. She had an episode of nausea/vomiting this morning. She is taking reglan. She reports feeling fatigued. The skin on her right chest is intact with pink skin along her scar area. She is using radiaplex. She is back to working full-time.                                 Set-up films were reviewed.                                 The chart was checked. Physical Findings. . .  height is 5\' 9"  (1.753 m) and weight is 210 lb 14.4 oz (95.664 kg). Her oral temperature is 98 F (36.7 C). Her blood pressure is 150/65 and her pulse is 65. . Weight essentially stable.  No significant changes. The lungs are clear. The heart has a regular rhythm and rate. The right chest wall area shows no appreciable radiation reaction. Impression . . . . . . . The patient is tolerating radiation. Plan . . . . . . . . . . . . Continue treatment as planned.  ________________________________   Blair Promise, PhD, MD

## 2015-04-11 NOTE — Progress Notes (Signed)
Darlene [name] has completed 5 fractions to her right breast.  She denies having pain.  She is taking xeloda.  She had an episode of nausea/vomiting this morning.  She is taking reglan.  She reports feeling fatigued.  The skin on her right chest is intact with pink skin along her scar area.  She is using radiaplex.  BP 150/65 mmHg  Pulse 65  Temp(Src) 98 F (36.7 C) (Oral)  Ht 5\' 9"  (1.753 m)  Wt 210 lb 14.4 oz (95.664 kg)  BMI 31.13 kg/m2  LMP 10/22/2007

## 2015-04-12 ENCOUNTER — Ambulatory Visit
Admission: RE | Admit: 2015-04-12 | Discharge: 2015-04-12 | Disposition: A | Discharge status: Home / Self Care | Payer: BLUE CROSS/BLUE SHIELD | Source: Ambulatory Visit | Attending: Radiation Oncology | Admitting: Radiation Oncology

## 2015-04-12 DIAGNOSIS — Z510 Encounter for antineoplastic radiation therapy: Secondary | ICD-10-CM | POA: Diagnosis not present

## 2015-04-13 ENCOUNTER — Telehealth: Payer: Self-pay | Admitting: Pharmacist

## 2015-04-13 ENCOUNTER — Ambulatory Visit
Admission: RE | Admit: 2015-04-13 | Discharge: 2015-04-13 | Disposition: A | Discharge status: Home / Self Care | Payer: BLUE CROSS/BLUE SHIELD | Source: Ambulatory Visit | Attending: Radiation Oncology | Admitting: Radiation Oncology

## 2015-04-13 DIAGNOSIS — Z510 Encounter for antineoplastic radiation therapy: Secondary | ICD-10-CM | POA: Diagnosis not present

## 2015-04-13 NOTE — Telephone Encounter (Signed)
04/13/15: Attempted to reach patient for follow up on oral medication: Xeloda. No answer. Left VM for patient to call back with any questions or issues.   Thank you,  Montel Clock, PharmD, [facility] [phone]

## 2015-04-14 ENCOUNTER — Ambulatory Visit
Admission: RE | Admit: 2015-04-14 | Discharge: 2015-04-14 | Disposition: A | Discharge status: Home / Self Care | Payer: BLUE CROSS/BLUE SHIELD | Source: Ambulatory Visit | Attending: Radiation Oncology | Admitting: Radiation Oncology

## 2015-04-14 DIAGNOSIS — Z510 Encounter for antineoplastic radiation therapy: Secondary | ICD-10-CM | POA: Diagnosis not present

## 2015-04-17 ENCOUNTER — Encounter: Payer: Self-pay | Admitting: Nurse Practitioner

## 2015-04-17 ENCOUNTER — Other Ambulatory Visit (HOSPITAL_BASED_OUTPATIENT_CLINIC_OR_DEPARTMENT_OTHER): Payer: BLUE CROSS/BLUE SHIELD

## 2015-04-17 ENCOUNTER — Ambulatory Visit: Payer: BLUE CROSS/BLUE SHIELD

## 2015-04-17 ENCOUNTER — Ambulatory Visit
Admission: RE | Admit: 2015-04-17 | Discharge: 2015-04-17 | Disposition: A | Discharge status: Home / Self Care | Payer: BLUE CROSS/BLUE SHIELD | Source: Ambulatory Visit | Attending: Radiation Oncology | Admitting: Radiation Oncology

## 2015-04-17 ENCOUNTER — Ambulatory Visit (HOSPITAL_BASED_OUTPATIENT_CLINIC_OR_DEPARTMENT_OTHER): Payer: BLUE CROSS/BLUE SHIELD

## 2015-04-17 ENCOUNTER — Ambulatory Visit (HOSPITAL_BASED_OUTPATIENT_CLINIC_OR_DEPARTMENT_OTHER): Payer: BLUE CROSS/BLUE SHIELD | Admitting: Nurse Practitioner

## 2015-04-17 VITALS — BP 148/52 | HR 58 | Temp 97.7°F | Resp 18 | Wt 209.1 lb

## 2015-04-17 DIAGNOSIS — Z510 Encounter for antineoplastic radiation therapy: Secondary | ICD-10-CM | POA: Diagnosis not present

## 2015-04-17 DIAGNOSIS — Z95828 Presence of other vascular implants and grafts: Secondary | ICD-10-CM

## 2015-04-17 DIAGNOSIS — Z171 Estrogen receptor negative status [ER-]: Secondary | ICD-10-CM

## 2015-04-17 DIAGNOSIS — C773 Secondary and unspecified malignant neoplasm of axilla and upper limb lymph nodes: Secondary | ICD-10-CM

## 2015-04-17 DIAGNOSIS — Z5112 Encounter for antineoplastic immunotherapy: Secondary | ICD-10-CM | POA: Diagnosis not present

## 2015-04-17 DIAGNOSIS — C50411 Malignant neoplasm of upper-outer quadrant of right female breast: Secondary | ICD-10-CM

## 2015-04-17 LAB — CBC WITH DIFFERENTIAL/PLATELET
BASO%: 0.3 % (ref 0.0–2.0)
Basophils Absolute: 0 10*3/uL (ref 0.0–0.1)
EOS%: 2.6 % (ref 0.0–7.0)
Eosinophils Absolute: 0.1 10*3/uL (ref 0.0–0.5)
HCT: 29.6 % — ABNORMAL LOW (ref 34.8–46.6)
HGB: 9.7 g/dL — ABNORMAL LOW (ref 11.6–15.9)
LYMPH%: 26.1 % (ref 14.0–49.7)
MCH: 28.4 pg (ref 25.1–34.0)
MCHC: 32.9 g/dL (ref 31.5–36.0)
MCV: 86.4 fL (ref 79.5–101.0)
MONO#: 0.4 10*3/uL (ref 0.1–0.9)
MONO%: 7.5 % (ref 0.0–14.0)
NEUT#: 3.3 10*3/uL (ref 1.5–6.5)
NEUT%: 63.5 % (ref 38.4–76.8)
Platelets: 180 10*3/uL (ref 145–400)
RBC: 3.43 10*6/uL — ABNORMAL LOW (ref 3.70–5.45)
RDW: 16 % — ABNORMAL HIGH (ref 11.2–14.5)
WBC: 5.3 10*3/uL (ref 3.9–10.3)
lymph#: 1.4 10*3/uL (ref 0.9–3.3)

## 2015-04-17 LAB — COMPREHENSIVE METABOLIC PANEL
ALT: 45 U/L (ref 0–55)
AST: 27 U/L (ref 5–34)
Albumin: 3.3 g/dL — ABNORMAL LOW (ref 3.5–5.0)
Alkaline Phosphatase: 72 U/L (ref 40–150)
Anion Gap: 8 mEq/L (ref 3–11)
BUN: 30.8 mg/dL — ABNORMAL HIGH (ref 7.0–26.0)
CO2: 23 mEq/L (ref 22–29)
Calcium: 8.8 mg/dL (ref 8.4–10.4)
Chloride: 110 mEq/L — ABNORMAL HIGH (ref 98–109)
Creatinine: 1.1 mg/dL (ref 0.6–1.1)
EGFR: 52 mL/min/{1.73_m2} — ABNORMAL LOW (ref >90)
Glucose: 152 mg/dl — ABNORMAL HIGH (ref 70–140)
Potassium: 3.9 mEq/L (ref 3.5–5.1)
Sodium: 142 mEq/L (ref 136–145)
Total Bilirubin: 0.61 mg/dL (ref 0.20–1.20)
Total Protein: 6.1 g/dL — ABNORMAL LOW (ref 6.4–8.3)

## 2015-04-17 MED ORDER — HEPARIN SOD (PORK) LOCK FLUSH 100 UNIT/ML IV SOLN
500.0000 [IU] | Freq: Once | INTRAVENOUS | Status: AC | PRN
  Administered 2015-04-17: 500 [IU]
  Filled 2015-04-17: qty 5

## 2015-04-17 MED ORDER — ACETAMINOPHEN 325 MG PO TABS
650.0000 mg | ORAL_TABLET | Freq: Once | ORAL | Status: AC
  Administered 2015-04-17: 650 mg via ORAL

## 2015-04-17 MED ORDER — SODIUM CHLORIDE 0.9 % IJ SOLN
10.0000 mL | INTRAMUSCULAR | Status: DC | PRN
  Administered 2015-04-17: 10 mL
  Filled 2015-04-17: qty 10

## 2015-04-17 MED ORDER — DIPHENHYDRAMINE HCL 25 MG PO CAPS
ORAL_CAPSULE | ORAL | Status: AC
  Filled 2015-04-17: qty 1

## 2015-04-17 MED ORDER — SODIUM CHLORIDE 0.9% FLUSH
10.0000 mL | INTRAVENOUS | Status: DC | PRN
  Administered 2015-04-17: 10 mL via INTRAVENOUS
  Filled 2015-04-17: qty 10

## 2015-04-17 MED ORDER — TRASTUZUMAB CHEMO INJECTION 440 MG
6.0000 mg/kg | Freq: Once | INTRAVENOUS | Status: AC
  Administered 2015-04-17: 567 mg via INTRAVENOUS
  Filled 2015-04-17: qty 27

## 2015-04-17 MED ORDER — ACETAMINOPHEN 325 MG PO TABS
ORAL_TABLET | ORAL | Status: AC
  Filled 2015-04-17: qty 2

## 2015-04-17 MED ORDER — DIPHENHYDRAMINE HCL 25 MG PO CAPS
25.0000 mg | ORAL_CAPSULE | Freq: Once | ORAL | Status: AC
  Administered 2015-04-17: 25 mg via ORAL

## 2015-04-17 MED ORDER — SODIUM CHLORIDE 0.9 % IV SOLN
Freq: Once | INTRAVENOUS | Status: AC
  Administered 2015-04-17: 10:00:00 via INTRAVENOUS

## 2015-04-17 NOTE — Progress Notes (Signed)
[place]  [phone] [phone]   ID: QAMAR ROSMAN DOB: Feb 09, 1950  MR#: [id number]  DVV#:[id number]  Patient Care Team: Thressa Sheller, MD as PCP - General (Internal Medicine) Bryson Ha Himmelrich, RD as Dietitian (Bariatrics) Stark Klein, MD as Consulting Physician (General Surgery) Chauncey Cruel, MD as Consulting Physician (Oncology) Arloa Koh, MD as Consulting Physician (Radiation Oncology) Mauro Kaufmann, RN as Registered Nurse Rockwell Germany, RN as Registered Nurse Jacelyn Pi, MD as Consulting Physician (Endocrinology) PCP: Thressa Sheller, MD GYN: Kem Boroughs FNP OTHER MD:  CHIEF COMPLAINT:  HER-2 positive, estrogen receptor negative breast cancer  CURRENT TREATMENT:  anti-HER-2 treatment; adjuvant radiation and capecitabine pending  BREAST CANCER HISTORY: From the original intake note:  "Darlene [name]" had screening mammography at Darlene [name] had screening mammography at her gynecologist suggestive of a possible abnormality in the right breast. However right diagnostic mammogram 04/12/2013 at [facility] was negative. More recently however, she noted a lump in her right breast and brought it to her gynecologist's attention. On 07/18/2014 the patient had bilateral diagnostic mammography with tomosynthesis at [facility]. The breast density was category B. In the right breast upper outer quadrant there was an area of focal asymmetry with indistinct margins.[name] Ultrasound was obtained and showed in addition to the mass in question, measuring 1.7 cm, an abnormal-appearing lymph node in the right axillary tail.  On 07/19/2014 the patient underwent biopsy of the right breast massin question as well as a right axillary lymph node. Both were positive for invasive ductal carcinoma, grade 2, estrogen and progesterone receptor negative, with an MIB-1 of 90%, and HER-2 pending. Incidentally the lymph node biopsy showed a lymphocytic inflammatory component but no lymph node architecture.  Her  subsequent history is as detailed below.  INTERVAL HISTORY:  Darlene [name] returns today for follow-up of her locally a locally advanced breast cancer, accompanied by her husband Darlene [name]. She is due for her next dose of trastuzumab today. She is also taking radiation treatments daily with concurrent low dose capecitabine (1022m BID on radiation days only).   REVIEW OF SYSTEMS: Her right chest is pink from radiation, but the skin is still intact. She is applying the appropriate creams. She is not having any pain. She denies fevers, chills, nausea, or vomiting. She is taking the reglan as prescribed. She is moving her bowels well with no issues. She denies mouth sores or rashes. She has some residual neuropathy symptoms to her feet, but this is stable. She is having some sinus drainage managed with claritin. A detailed review of systems is otherwise stable.  PAST MEDICAL HISTORY: Past Medical History  Diagnosis Date  . Obesity   . Hypertension   . Hyperlipidemia   . Diabetes mellitus without complication ([place]     262+years  . Eating disorder     binge eating  . Balance problem   . Arthritis     in fingers, shoulders  . Anemia     hx of  . Depression   . Ovarian cyst rupture     possible  . Clumsiness   . Breast cancer of upper-outer quadrant of right female breast ([place] 07/22/2014  . Breast cancer ([place]   . Anxiety   . Pneumonia   . Neuromuscular disorder ([place]     diabetic neuropathy  . Obstructive sleep apnea on CPAP     uses cpap nightly  . Heart murmur     PAST SURGICAL HISTORY: Past Surgical History  Procedure Laterality Date  . Carpal tunnel release Right   .  Carpal tunnel release Left   . Breath tek h pylori N/A 09/15/2012    Procedure: BREATH TEK H PYLORI;  Surgeon: Pedro Earls, MD;  Location: [facility];  Service: General;  Laterality: N/A;  . Dupuytren contracture release    . Trigger finger release      x3  . Toe surgery  1970s    bone spur  . Rotator cuff  repair Right 2005  . Gastric roux-en-y N/A 11/02/2012    Procedure: LAPAROSCOPIC ROUX-EN-Y GASTRIC;  Surgeon: Pedro Earls, MD;  Location: [facility];  Service: General;  Laterality: N/A;  . Bunionectomy Left 12/2013  . Portacath placement Left 08/02/2014    Procedure: INSERTION PORT-A-CATH;  Surgeon: Stark Klein, MD;  Location: [facility];  Service: General;  Laterality: Left;  . Breast lumpectomy with radioactive seed and sentinel lymph node biopsy Right 12/27/2014    Procedure: BREAST LUMPECTOMY WITH RADIOACTIVE SEED AND SENTINEL LYMPH NODE BIOPSY;  Surgeon: Stark Klein, MD;  Location: [place];  Service: General;  Laterality: Right;  . Breast reduction surgery Bilateral 01/06/2015    Procedure: BILATERAL BREAST REDUCTION, RIGHT ONCOPLASTIC RECONSTRUCTION),LEFT BREAST REDUCTION FOR SYMMETRY;  Surgeon: [name], MD;  Location: [place];  Service: Plastics;  Laterality: Bilateral;  . Mastectomy Right 02/15/2015    modified  . Modified mastectomy Right 02/15/2015    Procedure: RIGHT MODIFIED RADICAL MASTECTOMY;  Surgeon: Stark Klein, MD;  Location: [place];  Service: General;  Laterality: Right;    FAMILY HISTORY Family History  Problem Relation Age of Onset  . CVA Mother   . Diabetes Father   . Heart failure Father   . Hypertension Sister   . Diabetes Brother   . Diabetes Paternal Uncle   . Breast cancer Paternal Grandmother    the patient's father died from complications of diabetes at age 19. The patient's mother died at age 32 with a subarachnoid hemorrhage. The patient had one brother, one sister. The only breast cancer was the patient's paternal grandmother diagnosed in her 31s. There is no history of ovarian cancer in the family.  GYNECOLOGIC HISTORY:  Patient's last menstrual period was 10/22/2007.  menarche age 8, the patient is GX P0. She went through the change of life in 2011. She did not take hormone replacement.  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 Her husband Darlene [name] is a retired Visual merchandiser (he taught at [employer]).  Darlene [name] has 2 children from an earlier marriage, Rockey Situ who teaches in [place], and Alivea Gladson,  who works at the [employer] in in [place]. He has 1 grand son, aged 44. The patient and her husband are not church attender's    ADVANCED DIRECTIVES:  Not in place   HEALTH MAINTENANCE: Social History  Substance Use Topics  . Smoking status: Never Smoker   . Smokeless tobacco: Never Used  . Alcohol Use: 0.0 oz/week    0 Standard drinks or equivalent per week     Comment: social     Colonoscopy:  May 2016  PAP:  Bone density: 07/05/2013 normal, with a T score of 0.4  Lipid panel:  Allergies  Allergen Reactions  . Peanuts [Peanut Oil] Itching    "Large amounts"  . Tape Hives and Rash    Current Outpatient Prescriptions  Medication Sig Dispense Refill  . capecitabine (XELODA) 500 MG tablet Take 2 tablets by mouth after a meal  2 times a day on radiation days; do not take on days when you do not receive radiation 150 tablet 4  . diphenhydrAMINE (BENADRYL) 25 MG tablet Take 25 mg by mouth daily as needed for allergies or sleep. Reported on 03/06/2015    . glucose blood (ONETOUCH VERIO) test strip 1 each by Other route as needed for other. Reported on 03/06/2015    . hyaluronate sodium (RADIAPLEXRX) GEL Apply 1 application topically 2 (two) times daily.    [name] losartan (COZAAR) 100 MG tablet Take 1 tablet by mouth daily. Reported on 03/06/2015    . Melatonin 5 MG TABS Take 5 mg by mouth at bedtime. Reported on 03/06/2015    . metoCLOPramide (REGLAN) 5 MG tablet Take 1 tablet (5 mg total) by mouth 3 (three) times daily before meals. 90 tablet 4  . Multiple Vitamin (MULTIVITAMIN WITH MINERALS) TABS tablet Take 1 tablet by mouth 2 (two) times daily. Reported on 4/76/5465    . non-metallic deodorant Jethro Poling) MISC  Apply 1 application topically daily as needed.    [name] NOVOLIN 70/30 (70-30) 100 UNIT/ML injection Inject 10-12 Units into the skin 2 (two) times daily with a meal. Reported on 03/06/2015    . Probiotic Product ([employer] PROBIOTIC PO) Take 1 capsule by mouth daily. Reported on 03/06/2015    . Trastuzumab (HERCEPTIN IV) Inject into the vein. Reported on 03/06/2015    . Vortioxetine HBr (TRINTELLIX) 20 MG TABS Take 20 mg by mouth daily. Reported on 03/06/2015    . acetaminophen (TYLENOL) 325 MG tablet Take 650 mg by mouth 2 (two) times daily as needed for moderate pain or headache. Reported on 04/17/2015    . cholestyramine (QUESTRAN) 4 g packet Take 4 g by mouth 2 (two) times daily. Reported on 04/17/2015     No current facility-administered medications for this visit.   Facility-Administered Medications Ordered in Other Visits  Medication Dose Route Frequency Provider Last Rate Last Dose  . heparin lock flush 100 unit/mL  500 Units Intracatheter Once PRN Chauncey Cruel, MD      . sodium chloride 0.9 % injection 10 mL  10 mL Intracatheter PRN Chauncey Cruel, MD      . trastuzumab (HERCEPTIN) 567 mg in sodium chloride 0.9 % 250 mL chemo infusion  6 mg/kg (Treatment Plan Actual) Intravenous Once Chauncey Cruel, MD        OBJECTIVE:  Middle-aged white woman In no acute distress Filed Vitals:   04/17/15 0850  BP: 148/52  Pulse: 58  Temp: 97.7 F (36.5 C)  Resp: 18     Body mass index is 30.86 kg/(m^2).    ECOG FS:1 - Symptomatic but completely ambulatory  Sclerae unicteric, EOMs intact Oropharynx clear, dentition in good repair No cervical or supraclavicular adenopathy Lungs no rales or rhonchi Heart regular rate and rhythm Abd soft, nontender, positive bowel sounds MSK no focal spinal tenderness, no upper extremity lymphedema Neuro: nonfocal, well oriented, appropriate affect Breasts: The right breast is status post mastectomy. There is no evidence of chest wall recurrence. The right axilla  is benign. Left breast is unremarkable.   LAB RESULTS:  CMP     Component Value Date/Time   NA 142 04/17/2015 0811   NA 137 02/16/2015 0418   K 3.9 04/17/2015 0811   K 4.3 02/16/2015 0418   CL 107 02/16/2015 0418   CO2 23 04/17/2015 0811   CO2 23 02/16/2015 0418   GLUCOSE 152* 04/17/2015 0811   GLUCOSE 218*  02/16/2015 0418   BUN 30.8* 04/17/2015 0811   BUN 18 02/16/2015 0418   CREATININE 1.1 04/17/2015 0811   CREATININE 1.20* 02/16/2015 0418   CREATININE 1.10 02/18/2013 0827   CALCIUM 8.8 04/17/2015 0811   CALCIUM 8.4* 02/16/2015 0418   PROT 6.1* 04/17/2015 0811   PROT 6.0* 12/18/2014 1540   ALBUMIN 3.3* 04/17/2015 0811   ALBUMIN 3.6 12/18/2014 1540   AST 27 04/17/2015 0811   AST 21 12/18/2014 1540   ALT 45 04/17/2015 0811   ALT 22 12/18/2014 1540   ALKPHOS 72 04/17/2015 0811   ALKPHOS 73 12/18/2014 1540   BILITOT 0.61 04/17/2015 0811   BILITOT 1.0 12/18/2014 1540   GFRNONAA 47* 02/16/2015 0418   GFRNONAA 55* 02/18/2013 0827   GFRAA 55* 02/16/2015 0418   GFRAA 63 02/18/2013 0827    INo results found for: SPEP, UPEP  Lab Results  Component Value Date   WBC 5.3 04/17/2015   NEUTROABS 3.3 04/17/2015   HGB 9.7* 04/17/2015   HCT 29.6* 04/17/2015   MCV 86.4 04/17/2015   PLT 180 04/17/2015      Chemistry      Component Value Date/Time   NA 142 04/17/2015 0811   NA 137 02/16/2015 0418   K 3.9 04/17/2015 0811   K 4.3 02/16/2015 0418   CL 107 02/16/2015 0418   CO2 23 04/17/2015 0811   CO2 23 02/16/2015 0418   BUN 30.8* 04/17/2015 0811   BUN 18 02/16/2015 0418   CREATININE 1.1 04/17/2015 0811   CREATININE 1.20* 02/16/2015 0418   CREATININE 1.10 02/18/2013 0827      Component Value Date/Time   CALCIUM 8.8 04/17/2015 0811   CALCIUM 8.4* 02/16/2015 0418   ALKPHOS 72 04/17/2015 0811   ALKPHOS 73 12/18/2014 1540   AST 27 04/17/2015 0811   AST 21 12/18/2014 1540   ALT 45 04/17/2015 0811   ALT 22 12/18/2014 1540   BILITOT 0.61 04/17/2015 0811   BILITOT  1.0 12/18/2014 1540       No results found for: LABCA2  No components found for: [id number]  No results for input(s): INR in the last 168 hours.  Urinalysis    Component Value Date/Time   COLORURINE YELLOW 12/18/2014 1635   APPEARANCEUR CLEAR 12/18/2014 1635   LABSPEC 1.023 12/18/2014 1635   PHURINE 5.0 12/18/2014 1635   GLUCOSEU NEGATIVE 12/18/2014 1635   HGBUR NEGATIVE 12/18/2014 1635   BILIRUBINUR NEGATIVE 12/18/2014 1635   BILIRUBINUR neg 06/29/2013 [place] 12/18/2014 1635   PROTEINUR 30* 12/18/2014 1635   PROTEINUR trace 06/29/2013 1017   UROBILINOGEN negative 06/29/2013 1017   NITRITE NEGATIVE 12/18/2014 1635   NITRITE neg 06/29/2013 1017   LEUKOCYTESUR NEGATIVE 12/18/2014 1635    STUDIES: No results found.  ASSESSMENT: 65 y.o. Colp woman status post right breast and right axillary lymph node biopsy 07/19/2014 for a cT2  pN1, stage IIB  invasive ductal carcinoma, grade 2, estrogen and progesterone receptor negative, with an MIB-1 of 90%, and HER-2 amplified  (1) neoadjuvant chemotherapy started 08/08/2014, consisting of carboplatin, docetaxel, trastuzumab and pertuzumab given every 3 weeks 6, completed 11/21/2014  (a) breast MRI after initial 3 cycles shows a significant response  (b) breast MRI after 6 cycles shows a complete radiologic response   (2) trastuzumab to be continued to complete one year (through mid-July 2017)  (a) echo 02/13/2015 shows an EF of 60-65%  (3) right lumpectomy with sentinel lymph node biopsy on 12/27/14 showed a residual  ypT2  ypN1a, stage IIB invasive ductal carcinoma, grade 2, with repeat estrogen and progesterone receptors again negative  (4) status post right oncoplastic surgery (with left reduction mammoplasty) 01/06/2015 showing in the right breast multiple foci of intralymphatic carcinoma in random sections  (5)  Right modified radical mastectomy 02/15/2014 showed no residual invasive disease; there was DCIS  but margins were negative; all 15 axillary lymph nodes were clear  (6)  Postmastectomy radiation to follow   (7) adjuvant capecitabine pending   PLAN: Darlene [name] is performing remarkably well. She has no issues with the xeloda and radiation is going well. She will proceed with her next dose of trastuzumab as planned today.   She is already scheduled for a repeat echocardiogram in April.  Darlene [name] will return in 6 weeks for follow up with Dr. Jana Hakim. During this visit they will discuss adjuvant full dose capecitabine. She understands and agrees with this plan. She knows the goal of treatment in her case is cur.e she has been encouraged to call with any issues that might arise before her next visit here.   Laurie Panda, NP   04/17/2015 9:58 AM

## 2015-04-17 NOTE — Patient Instructions (Signed)

## 2015-04-17 NOTE — Patient Instructions (Signed)
[facility] Discharge Instructions for Patients Receiving Chemotherapy  Today you received the following chemotherapy agents: Hewrceptin.  To help prevent nausea and vomiting after your treatment, we encourage you to take your nausea medication as directed.   If you develop nausea and vomiting that is not controlled by your nausea medication, call the clinic.   BELOW ARE SYMPTOMS THAT SHOULD BE REPORTED IMMEDIATELY:  *FEVER GREATER THAN 100.5 F  *CHILLS WITH OR WITHOUT FEVER  NAUSEA AND VOMITING THAT IS NOT CONTROLLED WITH YOUR NAUSEA MEDICATION  *UNUSUAL SHORTNESS OF BREATH  *UNUSUAL BRUISING OR BLEEDING  TENDERNESS IN MOUTH AND THROAT WITH OR WITHOUT PRESENCE OF ULCERS  *URINARY PROBLEMS  *BOWEL PROBLEMS  UNUSUAL RASH Items with * indicate a potential emergency and should be followed up as soon as possible.  Feel free to call the clinic you have any questions or concerns. The clinic phone number is [phone].  Please show the [facility] at check-in to the Emergency Department and triage nurse.

## 2015-04-18 ENCOUNTER — Telehealth: Payer: Self-pay | Admitting: *Deleted

## 2015-04-18 ENCOUNTER — Ambulatory Visit
Admission: RE | Admit: 2015-04-18 | Discharge: 2015-04-18 | Disposition: A | Discharge status: Home / Self Care | Payer: BLUE CROSS/BLUE SHIELD | Source: Ambulatory Visit | Attending: Radiation Oncology | Admitting: Radiation Oncology

## 2015-04-18 VITALS — BP 157/60 | HR 63 | Temp 97.8°F | Ht 69.0 in | Wt 213.0 lb

## 2015-04-18 DIAGNOSIS — Z510 Encounter for antineoplastic radiation therapy: Secondary | ICD-10-CM | POA: Diagnosis not present

## 2015-04-18 DIAGNOSIS — C50411 Malignant neoplasm of upper-outer quadrant of right female breast: Secondary | ICD-10-CM

## 2015-04-18 NOTE — Progress Notes (Signed)
  Radiation Oncology         [phone] ________________________________  Name: Darlene [name] MRN: [id number]  Date: 04/18/2015  DOB: January 09, 1951  Weekly Radiation Therapy Management    ICD-9-CM ICD-10-CM   1. Breast cancer of upper-outer quadrant of right female breast ([place]) 174.4 C50.411      Current Dose: 18 Gy     Planned Dose:  60.4 Gy  Narrative . . . . . . . . The patient presents for routine under treatment assessment.                                   The patient is without complaint.                                 Set-up films were reviewed.                                 The chart was checked. Physical Findings. . .  height is 5\' 9"  (1.753 m) and weight is 213 lb (96.616 kg). Her oral temperature is 97.8 F (36.6 C). Her blood pressure is 157/60 and her pulse is 63. . Weight essentially stable.  No significant changes. Lungs are clear. The heart has a regular rhythm and rate. The right breast area shows some mild erythema. Impression . . . . . . . The patient is tolerating radiation. Plan . . . . . . . . . . . . Continue treatment as planned.  ________________________________   Blair Promise, PhD, MD

## 2015-04-18 NOTE — Telephone Encounter (Signed)
  Oncology Nurse Navigator Documentation    Navigator Encounter Type: Treatment (04/18/15 1500)           Patient Visit Type: RadOnc (04/18/15 1500) Treatment Phase: First Radiation Tx (04/18/15 1500)                            Time Spent with Patient: 15 (04/18/15 1500)

## 2015-04-18 NOTE — Progress Notes (Signed)
Darlene [name] has completed 10 fractions to her right breast.  She denies having pain and fatigue.  She is using radiaplex.  The skin on her right chest is pink.   BP 157/60 mmHg  Pulse 63  Temp(Src) 97.8 F (36.6 C) (Oral)  Ht 5\' 9"  (1.753 m)  Wt 213 lb (96.616 kg)  BMI 31.44 kg/m2  LMP 10/22/2007

## 2015-04-19 ENCOUNTER — Ambulatory Visit
Admission: RE | Admit: 2015-04-19 | Discharge: 2015-04-19 | Disposition: A | Discharge status: Home / Self Care | Payer: BLUE CROSS/BLUE SHIELD | Source: Ambulatory Visit | Attending: Radiation Oncology | Admitting: Radiation Oncology

## 2015-04-19 DIAGNOSIS — Z510 Encounter for antineoplastic radiation therapy: Secondary | ICD-10-CM | POA: Diagnosis not present

## 2015-04-20 ENCOUNTER — Ambulatory Visit
Admission: RE | Admit: 2015-04-20 | Discharge: 2015-04-20 | Disposition: A | Discharge status: Home / Self Care | Payer: BLUE CROSS/BLUE SHIELD | Source: Ambulatory Visit | Attending: Radiation Oncology | Admitting: Radiation Oncology

## 2015-04-20 ENCOUNTER — Telehealth: Payer: Self-pay | Admitting: Pharmacist

## 2015-04-20 ENCOUNTER — Ambulatory Visit: Payer: BLUE CROSS/BLUE SHIELD

## 2015-04-20 DIAGNOSIS — Z510 Encounter for antineoplastic radiation therapy: Secondary | ICD-10-CM | POA: Diagnosis not present

## 2015-04-20 NOTE — Telephone Encounter (Signed)
Called patient for Xeloda follow up. She was not at home, left VM asking her to call. She is at [facility] daily for XRT, will try to see her when she is in the building today.

## 2015-04-20 NOTE — Telephone Encounter (Signed)
..  Oral Chemotherapy Follow-Up Form  Original Start date of oral chemotherapy:   04/05/15   Spoke with patient today in radiation oncology to follow up regarding patient's oral chemotherapy medication:  Xeloda  Pt is doing well today.  Pt reports 0 tablets/doses missed in the last week/month.   Pt reports the following side effects:  She remains fatigued, but attributes that to getting up at 5:30am to work every day so that she can get off in time to go to XRT as well as receiving XRT and Xeloda. She states she is doing very well overall and has been able to maintain full time work schedule.    Will follow up and call patient again in 4 weeks. She is aware that she can call us if she has any concerns or problems in the meantime.   Thank you,  Theone Murdoch, PharmD, [facility]

## 2015-04-20 NOTE — Telephone Encounter (Signed)
-----   Message from Shirlean Mylar, [facility] sent at 04/13/2015  1:25 PM EDT ----- Regarding: Xeloda/radiation follow up  Left message on 3/23 for 1 week follow up

## 2015-04-21 ENCOUNTER — Telehealth: Payer: Self-pay | Admitting: *Deleted

## 2015-04-21 ENCOUNTER — Ambulatory Visit: Payer: BLUE CROSS/BLUE SHIELD

## 2015-04-21 ENCOUNTER — Ambulatory Visit
Admission: RE | Admit: 2015-04-21 | Discharge: 2015-04-21 | Disposition: A | Discharge status: Home / Self Care | Payer: BLUE CROSS/BLUE SHIELD | Source: Ambulatory Visit | Attending: Radiation Oncology | Admitting: Radiation Oncology

## 2015-04-21 ENCOUNTER — Telehealth: Payer: Self-pay | Admitting: Oncology

## 2015-04-21 DIAGNOSIS — Z510 Encounter for antineoplastic radiation therapy: Secondary | ICD-10-CM | POA: Diagnosis not present

## 2015-04-21 NOTE — Progress Notes (Signed)
Patient stopped by the clinic after treatment.  Her right abdomen is larger than her left abdomen.  She denies having any pain in her abdomen.  She reports her bowels are moving normally.  Hyperactive bowel sounds heard in all 4 quadrants.  She reports occasional nausea depending on what she eats.  Advised her that if she starts to have pain, more nausea, change in bowel habits or fever over the weekend, to go to the ER or call the doctor on call.  She said she will be seeing Dr. Gwenlyn Perking on Monday.

## 2015-04-21 NOTE — Telephone Encounter (Signed)
Darlene [name] called and said that she has noticed that one side of her stomach is larger than the other for the past week.  She said she has gained about 10 lbs over the last month and is not sure if this is why the one side is larger.  She is taking Xeloda and Reglan.  She reports that her bowels are moving regularly with soft bowel movements.  She denies having a fever.  She reports when she pushes on the area, she has a sharp pain for a moment and then it feels like possibly a gas collection.  Advised her to come in before treatment to have the area assessed.  Darlene [name] verbalized agreement.  Also called Dr. Virgie Dad nurse and left a message.

## 2015-04-21 NOTE — Telephone Encounter (Signed)
This RN spoke with pt per her call and concern for onset of " enlarging area on my abdomen "  Per discussion- Darlene [name] states she noticed area over the past few days-  Area of concern is slightly above the umbilicus. Tender " if I kinda push down on it but not just to touch it "  No changes in bowel movements and no gastric/abd pain.  Darlene [name] states she is having a little " weight gain "-  Pt has noted history of multiple laparoscopic procedures - area is in area of above.  Pt is recently s/p right mastectomy.  Plan per call is pt will see this RN before [place] appointment.  If needed will see [name] for further work up if needed.

## 2015-04-24 ENCOUNTER — Ambulatory Visit
Admission: RE | Admit: 2015-04-24 | Discharge: 2015-04-24 | Disposition: A | Discharge status: Home / Self Care | Payer: BLUE CROSS/BLUE SHIELD | Source: Ambulatory Visit | Attending: Radiation Oncology | Admitting: Radiation Oncology

## 2015-04-24 ENCOUNTER — Other Ambulatory Visit: Payer: Self-pay | Admitting: Oncology

## 2015-04-24 ENCOUNTER — Other Ambulatory Visit: Payer: Self-pay | Admitting: *Deleted

## 2015-04-24 DIAGNOSIS — C773 Secondary and unspecified malignant neoplasm of axilla and upper limb lymph nodes: Secondary | ICD-10-CM

## 2015-04-24 DIAGNOSIS — Z510 Encounter for antineoplastic radiation therapy: Secondary | ICD-10-CM | POA: Diagnosis not present

## 2015-04-24 DIAGNOSIS — C50911 Malignant neoplasm of unspecified site of right female breast: Secondary | ICD-10-CM

## 2015-04-24 DIAGNOSIS — C50411 Malignant neoplasm of upper-outer quadrant of right female breast: Secondary | ICD-10-CM

## 2015-04-24 DIAGNOSIS — Z9884 Bariatric surgery status: Secondary | ICD-10-CM

## 2015-04-24 NOTE — Progress Notes (Unsigned)
Right abdomen area. That area on exam is indeed a bit larger than the left. This could simply be due  To the surgery she had, namely a right mastectomy. This could've shifted the fat structure a little. On the other hand she could have an enlarged liver. I was not able to clearly palpate NX but she has a very obese abdomen.  I think the safest thing to do, even though this is most likely benign, is to obtain an ultrasound of the liver. She is agreeable. We will call her with results of as those come in. Otherwise she is already scheduled to see me in 2 weeks.

## 2015-04-25 ENCOUNTER — Encounter: Payer: Self-pay | Admitting: Radiation Oncology

## 2015-04-25 ENCOUNTER — Ambulatory Visit
Admission: RE | Admit: 2015-04-25 | Discharge: 2015-04-25 | Disposition: A | Discharge status: Home / Self Care | Payer: BLUE CROSS/BLUE SHIELD | Source: Ambulatory Visit | Attending: Radiation Oncology | Admitting: Radiation Oncology

## 2015-04-25 VITALS — BP 155/61 | HR 75 | Temp 98.0°F | Ht 69.0 in | Wt 213.5 lb

## 2015-04-25 DIAGNOSIS — Z510 Encounter for antineoplastic radiation therapy: Secondary | ICD-10-CM | POA: Insufficient documentation

## 2015-04-25 DIAGNOSIS — C50411 Malignant neoplasm of upper-outer quadrant of right female breast: Secondary | ICD-10-CM

## 2015-04-25 NOTE — Progress Notes (Signed)
  Radiation Oncology         [phone] ________________________________  Name: Darlene [name] MRN: [id number]  Date: 04/25/2015  DOB: Apr 11, 1950  Weekly Radiation Therapy Management    ICD-9-CM ICD-10-CM   1. Breast cancer of upper-outer quadrant of right female breast ([place]) 174.4 C50.411      Current Dose: 27 Gy     Planned Dose:  60.4 Gy  Narrative . . . . . . . . The patient presents for routine under treatment assessment.                                  Micayla Romm has completed 15 fractions to her right breast. She denies having pain. She continues to take Xeloda and denies having any nausea. She said Dr. Jana Hakim has ordered an ultrasound of her right side which she has noticed is larger than the left side. She denies having fatigue. She is using radiaplex gel. The skin on her right chest wall is pink.                                  Set-up films were reviewed.                                 The chart was checked. Physical Findings. . .  height is 5\' 9"  (1.753 m) and weight is 213 lb 8 oz (96.843 kg). Her oral temperature is 98 F (36.7 C). Her blood pressure is 155/61 and her pulse is 75. . Weight essentially stable.  No significant changes. No obvious abdominal asymmetry or palpable mass in the right upper quadrant on my exam today. Impression . . . . . . . The patient is tolerating radiation. Plan . . . . . . . . . . . . Continue treatment as planned.  ________________________________   Blair Promise, PhD, MD

## 2015-04-25 NOTE — Progress Notes (Addendum)
Darlene [name] has completed 15 fractions to her right breast.  She denies having pain.  She continues to take Xeloda and denies having any nausea.  She said Dr. Jana Hakim has ordered an ultrasound of her right side which she has noticed is larger than the left side.  She denies having fatigue.  She is using radiaplex gel.  The skin on her right chest wall is pink.  BP 155/61 mmHg  Pulse 75  Temp(Src) 98 F (36.7 C) (Oral)  Ht 5\' 9"  (1.753 m)  Wt 213 lb 8 oz (96.843 kg)  BMI 31.51 kg/m2  LMP 10/22/2007   Wt Readings from Last 3 Encounters:  04/25/15 213 lb 8 oz (96.843 kg)  04/18/15 213 lb (96.616 kg)  04/17/15 209 lb 1.6 oz (94.847 kg)

## 2015-04-26 ENCOUNTER — Ambulatory Visit
Admission: RE | Admit: 2015-04-26 | Discharge: 2015-04-26 | Disposition: A | Discharge status: Home / Self Care | Payer: BLUE CROSS/BLUE SHIELD | Source: Ambulatory Visit | Attending: Radiation Oncology | Admitting: Radiation Oncology

## 2015-04-26 ENCOUNTER — Other Ambulatory Visit: Payer: Self-pay

## 2015-04-26 DIAGNOSIS — C773 Secondary and unspecified malignant neoplasm of axilla and upper limb lymph nodes: Secondary | ICD-10-CM

## 2015-04-26 DIAGNOSIS — Z510 Encounter for antineoplastic radiation therapy: Secondary | ICD-10-CM | POA: Diagnosis not present

## 2015-04-26 DIAGNOSIS — C50411 Malignant neoplasm of upper-outer quadrant of right female breast: Secondary | ICD-10-CM

## 2015-04-26 DIAGNOSIS — C50911 Malignant neoplasm of unspecified site of right female breast: Secondary | ICD-10-CM

## 2015-04-27 ENCOUNTER — Other Ambulatory Visit: Payer: BLUE CROSS/BLUE SHIELD

## 2015-04-27 ENCOUNTER — Ambulatory Visit
Admission: RE | Admit: 2015-04-27 | Discharge: 2015-04-27 | Disposition: A | Discharge status: Home / Self Care | Payer: BLUE CROSS/BLUE SHIELD | Source: Ambulatory Visit | Attending: Radiation Oncology | Admitting: Radiation Oncology

## 2015-04-27 ENCOUNTER — Ambulatory Visit: Payer: BLUE CROSS/BLUE SHIELD

## 2015-04-27 ENCOUNTER — Ambulatory Visit: Payer: BLUE CROSS/BLUE SHIELD | Admitting: Oncology

## 2015-04-27 DIAGNOSIS — Z510 Encounter for antineoplastic radiation therapy: Secondary | ICD-10-CM | POA: Diagnosis not present

## 2015-04-28 ENCOUNTER — Ambulatory Visit
Admission: RE | Admit: 2015-04-28 | Discharge: 2015-04-28 | Disposition: A | Discharge status: Home / Self Care | Payer: BLUE CROSS/BLUE SHIELD | Source: Ambulatory Visit | Attending: Radiation Oncology | Admitting: Radiation Oncology

## 2015-04-28 DIAGNOSIS — Z510 Encounter for antineoplastic radiation therapy: Secondary | ICD-10-CM | POA: Diagnosis not present

## 2015-05-01 ENCOUNTER — Ambulatory Visit
Admission: RE | Admit: 2015-05-01 | Discharge: 2015-05-01 | Disposition: A | Discharge status: Home / Self Care | Payer: BLUE CROSS/BLUE SHIELD | Source: Ambulatory Visit | Attending: Radiation Oncology | Admitting: Radiation Oncology

## 2015-05-01 DIAGNOSIS — Z510 Encounter for antineoplastic radiation therapy: Secondary | ICD-10-CM | POA: Diagnosis not present

## 2015-05-02 ENCOUNTER — Ambulatory Visit
Admission: RE | Admit: 2015-05-02 | Discharge: 2015-05-02 | Disposition: A | Discharge status: Home / Self Care | Payer: BLUE CROSS/BLUE SHIELD | Source: Ambulatory Visit | Attending: Radiation Oncology | Admitting: Radiation Oncology

## 2015-05-02 DIAGNOSIS — C50411 Malignant neoplasm of upper-outer quadrant of right female breast: Secondary | ICD-10-CM

## 2015-05-02 DIAGNOSIS — C50911 Malignant neoplasm of unspecified site of right female breast: Secondary | ICD-10-CM | POA: Insufficient documentation

## 2015-05-02 DIAGNOSIS — C773 Secondary and unspecified malignant neoplasm of axilla and upper limb lymph nodes: Secondary | ICD-10-CM | POA: Insufficient documentation

## 2015-05-02 DIAGNOSIS — Z510 Encounter for antineoplastic radiation therapy: Secondary | ICD-10-CM | POA: Insufficient documentation

## 2015-05-02 MED ORDER — RADIAPLEXRX EX GEL
Freq: Once | CUTANEOUS | Status: AC
  Administered 2015-05-02: 17:00:00 via TOPICAL

## 2015-05-02 NOTE — Progress Notes (Signed)
  Radiation Oncology         [phone] ________________________________  Name: Darlene [name] MRN: [id number]  Date: 05/02/2015  DOB: [birth year]  Weekly Radiation Therapy Management    ICD-9-CM ICD-10-CM   1. Breast cancer metastasized to axillary lymph node, right ([facility]) 174.9 C50.911 hyaluronate sodium (RADIAPLEXRX) gel   196.3 C77.3   2. Breast cancer of upper-outer quadrant of right female breast ([place]) 174.4 C50.411      Current Dose: 36 Gy     Planned Dose:  60.4 Gy  Narrative . . . . . . . . The patient presents for routine under treatment assessment.                                   The patient noticed some mild itching along the right chest wall area. She denies any drainage.                                 Set-up films were reviewed.                                 The chart was checked. Physical Findings. . . The right chest wall area shows some erythema but no moist desquamation or dry desquamation. Impression . . . . . . . The patient is tolerating radiation. Tolerating Xeloda well also Plan . . . . . . . . . . . . Continue treatment as planned.  ________________________________   Blair Promise, PhD, MD

## 2015-05-02 NOTE — Progress Notes (Signed)
  Radiation Oncology         [phone] ________________________________  Name: Darlene [name] MRN: [id number]  Date: 05/02/2015  DOB: 1950/02/15  Electron beam Simulation  Note    ICD-9-CM ICD-10-CM   1. Breast cancer of upper-outer quadrant of right female breast ([place]) 174.4 C50.411     Status: outpatient  NARRATIVE: Earlier today the patient underwent additional planning for radiation therapy directed at the right chest wall area. Patient was setup in the treatment position on the linear accelerator. She had outlining of a custom electron cutout field encompassing the mastectomy scar area. The patient will be treated with 5 additional treatments at 2 gray per fraction for a boost dose of 10 gray. She will be treated with 9 or 6 Me V electrons. Bolus will be used to improve the dose coverage. A special port plan is requested for treatment. -----------------------------------  Blair Promise, PhD, MD

## 2015-05-03 ENCOUNTER — Ambulatory Visit
Admission: RE | Admit: 2015-05-03 | Discharge: 2015-05-03 | Disposition: A | Discharge status: Home / Self Care | Payer: BLUE CROSS/BLUE SHIELD | Source: Ambulatory Visit | Attending: Radiation Oncology | Admitting: Radiation Oncology

## 2015-05-03 ENCOUNTER — Encounter: Payer: Self-pay | Admitting: Radiation Oncology

## 2015-05-03 DIAGNOSIS — C50411 Malignant neoplasm of upper-outer quadrant of right female breast: Secondary | ICD-10-CM

## 2015-05-03 DIAGNOSIS — Z510 Encounter for antineoplastic radiation therapy: Secondary | ICD-10-CM | POA: Diagnosis not present

## 2015-05-03 NOTE — Progress Notes (Signed)
  Radiation Oncology         [phone] ________________________________  Name: Darlene [name] MRN: [id number]  Date: 05/03/2015  DOB: 03/21/1950  Electron beam Simulation Note    ICD-9-CM ICD-10-CM   1. Breast cancer of upper-outer quadrant of right female breast ([place]) 174.4 C50.411     Status: outpatient  NARRATIVE: Today the patient had completion of her electron plan directed at the right chest wall area/mastectomy scar region. Patient will be treated with 9 Me V electrons prescribed to the 90% isodose line. A 0.8 cm bolus will be used to ensure adequate dose to the superficial aspects target area. A special port plan is requested. Plan is for the patient to receive 10 treatments at 2 gray per fraction for a boost dose of 10 gray.  -----------------------------------  Blair Promise, PhD, MD

## 2015-05-04 ENCOUNTER — Ambulatory Visit
Admission: RE | Admit: 2015-05-04 | Discharge: 2015-05-04 | Disposition: A | Discharge status: Home / Self Care | Payer: BLUE CROSS/BLUE SHIELD | Source: Ambulatory Visit | Attending: Radiation Oncology | Admitting: Radiation Oncology

## 2015-05-04 DIAGNOSIS — Z510 Encounter for antineoplastic radiation therapy: Secondary | ICD-10-CM | POA: Diagnosis not present

## 2015-05-05 ENCOUNTER — Ambulatory Visit
Admission: RE | Admit: 2015-05-05 | Discharge: 2015-05-05 | Disposition: A | Discharge status: Home / Self Care | Payer: BLUE CROSS/BLUE SHIELD | Source: Ambulatory Visit | Attending: Radiation Oncology | Admitting: Radiation Oncology

## 2015-05-05 DIAGNOSIS — Z510 Encounter for antineoplastic radiation therapy: Secondary | ICD-10-CM | POA: Diagnosis not present

## 2015-05-08 ENCOUNTER — Ambulatory Visit
Admission: RE | Admit: 2015-05-08 | Discharge: 2015-05-08 | Disposition: A | Discharge status: Home / Self Care | Payer: BLUE CROSS/BLUE SHIELD | Source: Ambulatory Visit | Attending: Radiation Oncology | Admitting: Radiation Oncology

## 2015-05-08 ENCOUNTER — Other Ambulatory Visit (HOSPITAL_COMMUNITY): Payer: Self-pay | Admitting: Cardiology

## 2015-05-08 ENCOUNTER — Ambulatory Visit: Payer: BLUE CROSS/BLUE SHIELD

## 2015-05-08 ENCOUNTER — Other Ambulatory Visit (HOSPITAL_BASED_OUTPATIENT_CLINIC_OR_DEPARTMENT_OTHER): Payer: BLUE CROSS/BLUE SHIELD

## 2015-05-08 ENCOUNTER — Ambulatory Visit (HOSPITAL_BASED_OUTPATIENT_CLINIC_OR_DEPARTMENT_OTHER): Payer: BLUE CROSS/BLUE SHIELD

## 2015-05-08 VITALS — BP 153/35 | HR 59 | Temp 98.0°F | Resp 18

## 2015-05-08 DIAGNOSIS — I509 Heart failure, unspecified: Secondary | ICD-10-CM

## 2015-05-08 DIAGNOSIS — C50411 Malignant neoplasm of upper-outer quadrant of right female breast: Secondary | ICD-10-CM | POA: Diagnosis not present

## 2015-05-08 DIAGNOSIS — Z5112 Encounter for antineoplastic immunotherapy: Secondary | ICD-10-CM | POA: Diagnosis not present

## 2015-05-08 DIAGNOSIS — Z95828 Presence of other vascular implants and grafts: Secondary | ICD-10-CM

## 2015-05-08 DIAGNOSIS — Z510 Encounter for antineoplastic radiation therapy: Secondary | ICD-10-CM | POA: Diagnosis not present

## 2015-05-08 LAB — CBC WITH DIFFERENTIAL/PLATELET
BASO%: 0.2 % (ref 0.0–2.0)
Basophils Absolute: 0 10*3/uL (ref 0.0–0.1)
EOS%: 2.2 % (ref 0.0–7.0)
Eosinophils Absolute: 0.1 10*3/uL (ref 0.0–0.5)
HCT: 29.9 % — ABNORMAL LOW (ref 34.8–46.6)
HGB: 10 g/dL — ABNORMAL LOW (ref 11.6–15.9)
LYMPH%: 23.4 % (ref 14.0–49.7)
MCH: 30 pg (ref 25.1–34.0)
MCHC: 33.4 g/dL (ref 31.5–36.0)
MCV: 89.8 fL (ref 79.5–101.0)
MONO#: 0.4 10*3/uL (ref 0.1–0.9)
MONO%: 9.4 % (ref 0.0–14.0)
NEUT#: 2.7 10*3/uL (ref 1.5–6.5)
NEUT%: 64.8 % (ref 38.4–76.8)
Platelets: 178 10*3/uL (ref 145–400)
RBC: 3.33 10*6/uL — ABNORMAL LOW (ref 3.70–5.45)
RDW: 18.5 % — ABNORMAL HIGH (ref 11.2–14.5)
WBC: 4.1 10*3/uL (ref 3.9–10.3)
lymph#: 1 10*3/uL (ref 0.9–3.3)

## 2015-05-08 LAB — COMPREHENSIVE METABOLIC PANEL WITH GFR
ALT: 20 U/L (ref 0–55)
AST: 20 U/L (ref 5–34)
Albumin: 3.4 g/dL — ABNORMAL LOW (ref 3.5–5.0)
Alkaline Phosphatase: 70 U/L (ref 40–150)
Anion Gap: 10 meq/L (ref 3–11)
BUN: 27.6 mg/dL — ABNORMAL HIGH (ref 7.0–26.0)
CO2: 23 meq/L (ref 22–29)
Calcium: 9.3 mg/dL (ref 8.4–10.4)
Chloride: 110 meq/L — ABNORMAL HIGH (ref 98–109)
Creatinine: 1.2 mg/dL — ABNORMAL HIGH (ref 0.6–1.1)
EGFR: 48 ml/min/1.73 m2 — ABNORMAL LOW
Glucose: 180 mg/dL — ABNORMAL HIGH (ref 70–140)
Potassium: 4 meq/L (ref 3.5–5.1)
Sodium: 142 meq/L (ref 136–145)
Total Bilirubin: 0.79 mg/dL (ref 0.20–1.20)
Total Protein: 6.3 g/dL — ABNORMAL LOW (ref 6.4–8.3)

## 2015-05-08 MED ORDER — SODIUM CHLORIDE 0.9 % IJ SOLN
10.0000 mL | INTRAMUSCULAR | Status: DC | PRN
  Administered 2015-05-08: 10 mL
  Filled 2015-05-08: qty 10

## 2015-05-08 MED ORDER — ACETAMINOPHEN 325 MG PO TABS
ORAL_TABLET | ORAL | Status: AC
  Filled 2015-05-08: qty 2

## 2015-05-08 MED ORDER — SODIUM CHLORIDE 0.9% FLUSH
10.0000 mL | INTRAVENOUS | Status: DC | PRN
  Administered 2015-05-08: 10 mL via INTRAVENOUS
  Filled 2015-05-08: qty 10

## 2015-05-08 MED ORDER — SODIUM CHLORIDE 0.9 % IV SOLN
Freq: Once | INTRAVENOUS | Status: AC
  Administered 2015-05-08: 09:00:00 via INTRAVENOUS

## 2015-05-08 MED ORDER — TRASTUZUMAB CHEMO INJECTION 440 MG
6.0000 mg/kg | Freq: Once | INTRAVENOUS | Status: AC
  Administered 2015-05-08: 567 mg via INTRAVENOUS
  Filled 2015-05-08: qty 27

## 2015-05-08 MED ORDER — HEPARIN SOD (PORK) LOCK FLUSH 100 UNIT/ML IV SOLN
500.0000 [IU] | Freq: Once | INTRAVENOUS | Status: AC | PRN
  Administered 2015-05-08: 500 [IU]
  Filled 2015-05-08: qty 5

## 2015-05-08 MED ORDER — DIPHENHYDRAMINE HCL 25 MG PO CAPS
25.0000 mg | ORAL_CAPSULE | Freq: Once | ORAL | Status: AC
  Administered 2015-05-08: 25 mg via ORAL

## 2015-05-08 MED ORDER — DIPHENHYDRAMINE HCL 25 MG PO CAPS
ORAL_CAPSULE | ORAL | Status: AC
  Filled 2015-05-08: qty 1

## 2015-05-08 MED ORDER — ACETAMINOPHEN 325 MG PO TABS
650.0000 mg | ORAL_TABLET | Freq: Once | ORAL | Status: AC
  Administered 2015-05-08: 650 mg via ORAL

## 2015-05-08 NOTE — Patient Instructions (Signed)

## 2015-05-08 NOTE — Patient Instructions (Signed)
[facility] Discharge Instructions for Patients Receiving Chemotherapy  Today you received the following chemotherapy agents:  Herceptin  To help prevent nausea and vomiting after your treatment, we encourage you to take your nausea medication as prescribed.   If you develop nausea and vomiting that is not controlled by your nausea medication, call the clinic.   BELOW ARE SYMPTOMS THAT SHOULD BE REPORTED IMMEDIATELY:  *FEVER GREATER THAN 100.5 F  *CHILLS WITH OR WITHOUT FEVER  NAUSEA AND VOMITING THAT IS NOT CONTROLLED WITH YOUR NAUSEA MEDICATION  *UNUSUAL SHORTNESS OF BREATH  *UNUSUAL BRUISING OR BLEEDING  TENDERNESS IN MOUTH AND THROAT WITH OR WITHOUT PRESENCE OF ULCERS  *URINARY PROBLEMS  *BOWEL PROBLEMS  UNUSUAL RASH Items with * indicate a potential emergency and should be followed up as soon as possible.  Feel free to call the clinic you have any questions or concerns. The clinic phone number is [phone].  Please show the CHEMO ALERT CARD at check-in to the Emergency Department and triage nurse.   

## 2015-05-09 ENCOUNTER — Ambulatory Visit: Payer: BLUE CROSS/BLUE SHIELD | Admitting: Radiation Oncology

## 2015-05-09 ENCOUNTER — Ambulatory Visit (HOSPITAL_BASED_OUTPATIENT_CLINIC_OR_DEPARTMENT_OTHER)
Admission: RE | Admit: 2015-05-09 | Discharge: 2015-05-09 | Disposition: A | Discharge status: Home / Self Care | Payer: BLUE CROSS/BLUE SHIELD | Source: Ambulatory Visit | Attending: Cardiology | Admitting: Cardiology

## 2015-05-09 ENCOUNTER — Ambulatory Visit
Admission: RE | Admit: 2015-05-09 | Discharge: 2015-05-09 | Disposition: A | Discharge status: Home / Self Care | Payer: BLUE CROSS/BLUE SHIELD | Source: Ambulatory Visit | Attending: Radiation Oncology | Admitting: Radiation Oncology

## 2015-05-09 ENCOUNTER — Encounter: Payer: Self-pay | Admitting: *Deleted

## 2015-05-09 ENCOUNTER — Ambulatory Visit (HOSPITAL_COMMUNITY)
Admission: RE | Admit: 2015-05-09 | Discharge: 2015-05-09 | Disposition: A | Discharge status: Home / Self Care | Payer: BLUE CROSS/BLUE SHIELD | Source: Ambulatory Visit | Attending: Internal Medicine | Admitting: Internal Medicine

## 2015-05-09 VITALS — BP 132/74 | HR 75 | Wt 213.2 lb

## 2015-05-09 DIAGNOSIS — E119 Type 2 diabetes mellitus without complications: Secondary | ICD-10-CM | POA: Insufficient documentation

## 2015-05-09 DIAGNOSIS — E785 Hyperlipidemia, unspecified: Secondary | ICD-10-CM | POA: Insufficient documentation

## 2015-05-09 DIAGNOSIS — C773 Secondary and unspecified malignant neoplasm of axilla and upper limb lymph nodes: Secondary | ICD-10-CM

## 2015-05-09 DIAGNOSIS — I059 Rheumatic mitral valve disease, unspecified: Secondary | ICD-10-CM | POA: Diagnosis not present

## 2015-05-09 DIAGNOSIS — C50411 Malignant neoplasm of upper-outer quadrant of right female breast: Secondary | ICD-10-CM | POA: Diagnosis not present

## 2015-05-09 DIAGNOSIS — I351 Nonrheumatic aortic (valve) insufficiency: Secondary | ICD-10-CM | POA: Insufficient documentation

## 2015-05-09 DIAGNOSIS — I509 Heart failure, unspecified: Secondary | ICD-10-CM | POA: Diagnosis not present

## 2015-05-09 DIAGNOSIS — I517 Cardiomegaly: Secondary | ICD-10-CM | POA: Diagnosis not present

## 2015-05-09 DIAGNOSIS — C50919 Malignant neoplasm of unspecified site of unspecified female breast: Secondary | ICD-10-CM

## 2015-05-09 DIAGNOSIS — Z510 Encounter for antineoplastic radiation therapy: Secondary | ICD-10-CM | POA: Diagnosis not present

## 2015-05-09 DIAGNOSIS — I5189 Other ill-defined heart diseases: Secondary | ICD-10-CM | POA: Diagnosis not present

## 2015-05-09 NOTE — Progress Notes (Signed)
  Radiation Oncology         [phone] ________________________________  Name: Darlene [name] MRN: [id number]  Date: 05/09/2015  DOB: 1950/06/15  Weekly Radiation Therapy Management    ICD-9-CM ICD-10-CM   1. Breast cancer of upper-outer quadrant of right female breast ([place]) 174.4 C50.411      Current Dose: 45 Gy     Planned Dose:  60.4 Gy  Narrative . . . . . . . . The patient presents for routine under treatment assessment.                                   Minimal fatigue and minimal itching and discomfort in the treatment area.                                 Set-up films were reviewed.                                 The chart was checked. Physical Findings. . . The right chest wall area shows some mild erythema and mild hyperpigmentation changes. Impression . . . . . . . The patient is tolerating radiation. Tolerating Xeloda as well. Plan . . . . . . . . . . . . Continue treatment as planned.  ________________________________   Blair Promise, PhD, MD  This document serves as a record of services personally performed by Gery Pray, MD. It was created on his behalf by Darcus Austin, a trained medical scribe. The creation of this record is based on the scribe's personal observations and the provider's statements to them. This document has been checked and approved by the attending provider.

## 2015-05-09 NOTE — Progress Notes (Signed)
  Radiation Oncology         [phone] ________________________________  Name: Darlene [name] MRN: [id number]  Date: 05/09/2015  DOB: [birth year]  Simulation Verification Note    ICD-9-CM ICD-10-CM   1. Breast cancer of upper-outer quadrant of right female breast ([place]) 174.4 C50.411     Status: outpatient  NARRATIVE: The patient was brought to the treatment unit and placed in the planned treatment position. The clinical setup was verified. Then port films were obtained and uploaded to the radiation oncology medical record software.  The treatment beams were carefully compared against the planned radiation fields. The position location and shape of the radiation fields was reviewed. They targeted volume of tissue appears to be appropriately covered by the radiation beams. Organs at risk appear to be excluded as planned.  Based on my personal review, I approved the simulation verification. The patient's treatment will proceed as planned.   -----------------------------------  Blair Promise, PhD, MD  This document serves as a record of services personally performed by Gery Pray, MD. It was created on his behalf by Darcus Austin, a trained medical scribe. The creation of this record is based on the scribe's personal observations and the provider's statements to them. This document has been checked and approved by the attending provider.

## 2015-05-09 NOTE — Progress Notes (Signed)
Advanced Heart Failure Medication Review by a Pharmacist  Does the patient  feel that his/her medications are working for him/her?  yes  Has the patient been experiencing any side effects to the medications prescribed?  no  Does the patient measure his/her own blood pressure or blood glucose at home?  yes   Does the patient have any problems obtaining medications due to transportation or finances?   no  Understanding of regimen: good Understanding of indications: good Potential of compliance: good Patient understands to avoid NSAIDs. Patient understands to avoid decongestants.  Issues to address at subsequent visits: None   Pharmacist comments:  Darlene [name] is a pleasant 65 yo F presenting without a medication list but with great recall of her regimen. She reports good compliance with her medications and did not have any specific medication-related questions or concerns for me at this time.  Ruta Hinds. Velva Harman, PharmD, BCPS, CPP Clinical Pharmacist Pager: [phone] Phone: [phone] 05/09/2015 12:46 PM      Time with patient: 8 minutes Preparation and documentation time: 2 minutes Total time: 10 minutes

## 2015-05-09 NOTE — Patient Instructions (Signed)
We will contact you in 3 months to schedule your next appointment and echocardiogram  

## 2015-05-09 NOTE — Progress Notes (Signed)
*  PRELIMINARY RESULTS* Echocardiogram 2D Echocardiogram has been performed.  Leavy Cella 05/09/2015, 11:51 AM

## 2015-05-10 ENCOUNTER — Ambulatory Visit
Admission: RE | Admit: 2015-05-10 | Discharge: 2015-05-10 | Disposition: A | Discharge status: Home / Self Care | Payer: BLUE CROSS/BLUE SHIELD | Source: Ambulatory Visit | Attending: Radiation Oncology | Admitting: Radiation Oncology

## 2015-05-10 ENCOUNTER — Ambulatory Visit (HOSPITAL_COMMUNITY)
Admission: RE | Admit: 2015-05-10 | Discharge: 2015-05-10 | Disposition: A | Discharge status: Home / Self Care | Payer: BLUE CROSS/BLUE SHIELD | Source: Ambulatory Visit | Attending: Oncology | Admitting: Oncology

## 2015-05-10 DIAGNOSIS — C50411 Malignant neoplasm of upper-outer quadrant of right female breast: Secondary | ICD-10-CM

## 2015-05-10 DIAGNOSIS — C773 Secondary and unspecified malignant neoplasm of axilla and upper limb lymph nodes: Secondary | ICD-10-CM

## 2015-05-10 DIAGNOSIS — C50911 Malignant neoplasm of unspecified site of right female breast: Secondary | ICD-10-CM

## 2015-05-10 DIAGNOSIS — I509 Heart failure, unspecified: Secondary | ICD-10-CM | POA: Diagnosis not present

## 2015-05-10 DIAGNOSIS — Z510 Encounter for antineoplastic radiation therapy: Secondary | ICD-10-CM | POA: Diagnosis not present

## 2015-05-10 NOTE — Progress Notes (Signed)
Patient ID: Darlene [name], female   DOB: 04-Jun-1950, 65 y.o.   MRN: [id number] Referring Physician:Dr [place]  Patient Care Team: Thressa Sheller, MD as PCP - General (Internal Medicine) Bryson Ha Himmelrich, RD as Dietitian (Bariatrics) Stark Klein, MD as Consulting Physician (General Surgery) Chauncey Cruel, MD as Consulting Physician (Oncology) Arloa Koh, MD as Consulting Physician (Radiation Oncology) Mauro Kaufmann, RN as Registered Nurse Rockwell Germany, RN as Registered Nurse Jacelyn Pi, MD as Consulting Physician (Endocrinology) PCP: Thressa Sheller, MD GYN: Kem Boroughs FNP   HPI: Ms Arizpe is a 65 year old with history of R breast cancer, right axillary lymph node biopsy 07/19/2014 for a cT2 pN1, stage IIB invasive ductal carcinoma, grade 2, estrogen and progesterone receptor negative, with an MIB-1 of 90%, and HER-2 positive , OSA -> CPAP, bariatric surgery, and DM, referred to cardio-oncology clinic by Dr Jana Hakim  Completed neoadjuvant chemotherapy will consist of carboplatin, docetaxel, trastuzumab and pertuzumab given every 3 weeks 6.  Trastuzumab to be continued to complete one year in 7/17.  She had lumpectomy 12/16 and had mastectomy in 1/17.  Now getting radiation and po Xeloda.  Today she denies SOB/CP.  Doing ok currently.    Echo (7/16) with EF 60-65%, grade II diastolic dysfunction.  Echo (10/16) with EF 66%, GLS -17%. Echo (1/17) with EF 60-65%, grade II diastolic dysfunction, lateral s' 10, GLS -18.8% Echo (4/17) with EF 60-65%, grade II diastolic dysfunction, lateral s' 9.5, strain poorly traced and off axis, normal RV size and systolic function.   Review of Systems: All systems reviewed and negative except as per HPI.   Past Medical History  Diagnosis Date  . Obesity   . Hypertension   . Hyperlipidemia   . Diabetes mellitus without complication ([place])     02+ years  . Eating disorder     binge eating  . Balance problem   .  Arthritis     in fingers, shoulders  . Anemia     hx of  . Depression   . Ovarian cyst rupture     possible  . Clumsiness   . Breast cancer of upper-outer quadrant of right female breast ([place]) 07/22/2014  . Breast cancer ([place])   . Anxiety   . Pneumonia   . Neuromuscular disorder ([place])     diabetic neuropathy  . Obstructive sleep apnea on CPAP     uses cpap nightly  . Heart murmur     Current Outpatient Prescriptions  Medication Sig Dispense Refill  . acetaminophen (TYLENOL) 325 MG tablet Take 650 mg by mouth 2 (two) times daily as needed for moderate pain or headache. Reported on 04/17/2015    . calcium citrate (CALCITRATE - DOSED IN MG ELEMENTAL CALCIUM) 950 MG tablet Take 600 mg of elemental calcium by mouth daily.    . capecitabine (XELODA) 500 MG tablet Take 2 tablets by mouth after a meal 2 times a day on radiation days; do not take on days when you do not receive radiation 150 tablet 4  . diphenhydrAMINE (BENADRYL) 25 MG tablet Take 25 mg by mouth daily as needed for allergies or sleep. Reported on 03/06/2015    . glucose blood (ONETOUCH VERIO) test strip 1 each by Other route as needed for other. Reported on 03/06/2015    . hyaluronate sodium (RADIAPLEXRX) GEL Apply 1 application topically 2 (two) times daily.    . Loratadine (CLARITIN) 10 MG CAPS Take 10 mg by mouth daily.     [name]  losartan (COZAAR) 100 MG tablet Take 1 tablet by mouth daily. Reported on 03/06/2015    . Melatonin 5 MG TABS Take 5 mg by mouth at bedtime. Reported on 03/06/2015    . metoCLOPramide (REGLAN) 5 MG tablet Take 1 tablet (5 mg total) by mouth 3 (three) times daily before meals. 90 tablet 4  . Multiple Vitamin (MULTIVITAMIN WITH MINERALS) TABS tablet Take 1 tablet by mouth 2 (two) times daily. Reported on 04/11/2246    . non-metallic deodorant Jethro Poling) MISC Apply 1 application topically daily as needed.    [name] NOVOLIN 70/30 (70-30) 100 UNIT/ML injection Inject 10 Units into the skin 2 (two) times daily with a meal.  Reported on 03/06/2015    . Probiotic Product ([employer] PROBIOTIC PO) Take 1 capsule by mouth daily. Reported on 03/06/2015    . Trastuzumab (HERCEPTIN IV) Inject into the vein. Reported on 03/06/2015    . Vortioxetine HBr (TRINTELLIX) 20 MG TABS Take 20 mg by mouth daily. Reported on 03/06/2015     No current facility-administered medications for this encounter.    Allergies  Allergen Reactions  . Peanuts [Peanut Oil] Itching    "Large amounts"  . Tape Hives and Rash      Social History   Social History  . Marital Status: Married    Spouse Name: [name]  . Number of Children: 0  . Years of Education: Bachelor's   Occupational History  . Theatre stage manager    Social History Main Topics  . Smoking status: Never Smoker   . Smokeless tobacco: Never Used  . Alcohol Use: 0.0 oz/week    0 Standard drinks or equivalent per week     Comment: social  . Drug Use: No  . Sexual Activity: Yes    Birth Control/ Protection: Post-menopausal   Other Topics Concern  . Not on file   Social History Narrative   Patient is married [name]) and lives at home with her husband.   Patient does not have any children.   Patient is working for a [employer].   Patient has a Bachelor's degree.   Patient is right-handed.   Patient does not drink any caffeine.      Family History  Problem Relation Age of Onset  . CVA Mother   . Diabetes Father   . Heart failure Father   . Hypertension Sister   . Diabetes Brother   . Diabetes Paternal Uncle   . Breast cancer Paternal Grandmother     Filed Vitals:   05/09/15 1223  BP: 132/74  Pulse: 75  Weight: 213 lb 4 oz (96.73 kg)  SpO2: 99%    PHYSICAL EXAM: General:  Well appearing. No respiratory difficulty HEENT: normal Neck: supple. no JVD. Carotids 2+ bilat; no bruits. No lymphadenopathy or thryomegaly appreciated. Cor: PMI nondisplaced. Regular rate & rhythm. No rubs, gallops or murmurs. Lungs: clear Abdomen: soft, nontender,  nondistended. No hepatosplenomegaly. No bruits or masses. Good bowel sounds. Extremities: no cyanosis, clubbing, rash, edema Neuro: alert & oriented x 3, cranial nerves grossly intact. moves all 4 extremities w/o difficulty. Affect pleasant.  ASSESSMENT & PLAN:  1. Breast cancer: I reviewed today's echo.  EF remains stable at 60-65%.  Not technically adequate for strain measurement on this echo.  No evidence for trastuzumab-related cardio-toxicity.  She will followup in 3 months with repeat echo.  She will complete trastuzumab just prior to the next echo.  This likely will be her last echo if it remains stable.  2.  HTN: Continue current regimen.  3. OSA: Continue nightly CPAP   Loralie Champagne 05/10/2015

## 2015-05-11 ENCOUNTER — Ambulatory Visit
Admission: RE | Admit: 2015-05-11 | Discharge: 2015-05-11 | Disposition: A | Discharge status: Home / Self Care | Payer: BLUE CROSS/BLUE SHIELD | Source: Ambulatory Visit | Attending: Radiation Oncology | Admitting: Radiation Oncology

## 2015-05-11 ENCOUNTER — Ambulatory Visit: Payer: BLUE CROSS/BLUE SHIELD | Admitting: Radiation Oncology

## 2015-05-11 DIAGNOSIS — Z510 Encounter for antineoplastic radiation therapy: Secondary | ICD-10-CM | POA: Diagnosis not present

## 2015-05-12 ENCOUNTER — Ambulatory Visit
Admission: RE | Admit: 2015-05-12 | Discharge: 2015-05-12 | Disposition: A | Discharge status: Home / Self Care | Payer: BLUE CROSS/BLUE SHIELD | Source: Ambulatory Visit | Attending: Radiation Oncology | Admitting: Radiation Oncology

## 2015-05-12 DIAGNOSIS — Z510 Encounter for antineoplastic radiation therapy: Secondary | ICD-10-CM | POA: Diagnosis not present

## 2015-05-15 ENCOUNTER — Ambulatory Visit
Admission: RE | Admit: 2015-05-15 | Discharge: 2015-05-15 | Disposition: A | Discharge status: Home / Self Care | Payer: BLUE CROSS/BLUE SHIELD | Source: Ambulatory Visit | Attending: Radiation Oncology | Admitting: Radiation Oncology

## 2015-05-15 DIAGNOSIS — Z510 Encounter for antineoplastic radiation therapy: Secondary | ICD-10-CM | POA: Diagnosis not present

## 2015-05-16 ENCOUNTER — Ambulatory Visit
Admission: RE | Admit: 2015-05-16 | Discharge: 2015-05-16 | Disposition: A | Discharge status: Home / Self Care | Payer: BLUE CROSS/BLUE SHIELD | Source: Ambulatory Visit | Attending: Radiation Oncology | Admitting: Radiation Oncology

## 2015-05-16 ENCOUNTER — Encounter: Payer: Self-pay | Admitting: Radiation Oncology

## 2015-05-16 VITALS — BP 171/69 | HR 61 | Temp 98.0°F | Ht 69.0 in | Wt 211.6 lb

## 2015-05-16 DIAGNOSIS — C50411 Malignant neoplasm of upper-outer quadrant of right female breast: Secondary | ICD-10-CM

## 2015-05-16 DIAGNOSIS — Z510 Encounter for antineoplastic radiation therapy: Secondary | ICD-10-CM | POA: Diagnosis not present

## 2015-05-16 NOTE — Progress Notes (Signed)
Darlene [name] has completed 29 fractions to her right breast.  She denies having pain.  She reports having slight fatigue.  She is using radiaplex.  The skin on her right chest is red.   BP 171/69 mmHg  Pulse 61  Temp(Src) 98 F (36.7 C) (Oral)  Ht 5\' 9"  (1.753 m)  Wt 211 lb 9.6 oz (95.981 kg)  BMI 31.23 kg/m2  LMP 10/22/2007

## 2015-05-16 NOTE — Progress Notes (Signed)
  Radiation Oncology         [phone] ________________________________  Name: Darlene [name] MRN: [id number]  Date: 05/16/2015  DOB: 1950-03-21  Weekly Radiation Therapy Management    ICD-9-CM ICD-10-CM   1. Breast cancer of upper-outer quadrant of right female breast ([place]) 174.4 C50.411      Current Dose: 54.4 Gy     Planned Dose:  60.4 Gy  Narrative . . . . . . . . The patient presents for routine under treatment assessment.                                  The patient has completed 29 fractions to her right breast. She denies pain, but reports some slight fatigue. She is using radiaplex. The skin on her right chest is red.                                 Set-up films were reviewed.                                 The chart was checked. Physical Findings. . Danley Danker Vitals:   05/16/15 1620  BP: 171/69  Pulse: 61  Temp: 98 F (36.7 C)  TempSrc: Oral  Height: 5\' 9"  (1.753 m)  Weight: 211 lb 9.6 oz (95.981 kg)  Some hyperpigmentation changes and some erythema in the right chest wall. No skin breakdown. Impression . . . . . . . The patient is tolerating radiation. Plan . . . . . . . . . . . . Continue treatment as planned.  ________________________________   Blair Promise, PhD, MD  This document serves as a record of services personally performed by Gery Pray, MD. It was created on his behalf by Darcus Austin, a trained medical scribe. The creation of this record is based on the scribe's personal observations and the provider's statements to them. This document has been checked and approved by the attending provider.

## 2015-05-17 ENCOUNTER — Ambulatory Visit
Admission: RE | Admit: 2015-05-17 | Discharge: 2015-05-17 | Disposition: A | Discharge status: Home / Self Care | Payer: BLUE CROSS/BLUE SHIELD | Source: Ambulatory Visit | Attending: Radiation Oncology | Admitting: Radiation Oncology

## 2015-05-17 DIAGNOSIS — Z510 Encounter for antineoplastic radiation therapy: Secondary | ICD-10-CM | POA: Diagnosis not present

## 2015-05-18 ENCOUNTER — Encounter: Payer: Self-pay | Admitting: Radiation Oncology

## 2015-05-18 ENCOUNTER — Ambulatory Visit
Admission: RE | Admit: 2015-05-18 | Discharge: 2015-05-18 | Disposition: A | Discharge status: Home / Self Care | Payer: BLUE CROSS/BLUE SHIELD | Source: Ambulatory Visit | Attending: Radiation Oncology | Admitting: Radiation Oncology

## 2015-05-18 VITALS — BP 146/60 | HR 54 | Temp 97.9°F | Resp 16 | Wt 211.0 lb

## 2015-05-18 DIAGNOSIS — C50411 Malignant neoplasm of upper-outer quadrant of right female breast: Secondary | ICD-10-CM

## 2015-05-18 DIAGNOSIS — Z510 Encounter for antineoplastic radiation therapy: Secondary | ICD-10-CM | POA: Diagnosis not present

## 2015-05-18 NOTE — Progress Notes (Signed)
  Radiation Oncology         [phone] ________________________________  Name: Darlene [name] MRN: [id number]  Date: 05/18/2015  DOB: [birth year]  Weekly Radiation Therapy Management    ICD-9-CM ICD-10-CM   1. Breast cancer of upper-outer quadrant of right female breast ([place]) 174.4 C50.411      Current Dose: 58.4 Gy     Planned Dose:  60.4 Gy  Narrative . . . . . . . . The patient presents for routine under treatment assessment.                                  The patient has completed 32/33 fractions to her right breast. The patient complains of erythremia, some dryness under the right axilla, and pruritus. She states she has enough radiaplex gel. She has a fair appetite and energy level.                                 Set-up films were reviewed.                                 The chart was checked. Physical Findings. [name] Danley Danker Vitals:   05/18/15 1629  BP: 146/60  Pulse: 54  Temp: 97.9 F (36.6 C)  TempSrc: Oral  Resp: 16  Weight: 211 lb (95.709 kg)  Some hyperpigmentation changes and some erythema in the right chest wall. No skin breakdown. Impression . . . . . . . The patient is tolerating radiation. Plan . . . . . . . . . . . . Continue treatment as planned. The patient was given a one month follow up card. ________________________________   Blair Promise, PhD, MD  This document serves as a record of services personally performed by Gery Pray, MD. It was created on his behalf by Darcus Austin, a trained medical scribe. The creation of this record is based on the scribe's personal observations and the provider's statements to them. This document has been checked and approved by the attending provider.

## 2015-05-18 NOTE — Progress Notes (Addendum)
Weekly rad txs right breast chest wall, 32/33 txs complemented, erythremia , dry ness under axilla area, has enough rdiaplex gel stated, will give 1 month f/u appt card Appetite okay, energy level  Good this week, only c/o itching, 4:32 PM BP 146/60 mmHg  Pulse 54  Temp(Src) 97.9 F (36.6 C) (Oral)  Resp 16  Wt 211 lb (95.709 kg)  LMP 10/22/2007  Wt Readings from Last 3 Encounters:  05/18/15 211 lb (95.709 kg)  05/16/15 211 lb 9.6 oz (95.981 kg)  05/09/15 213 lb 4 oz (96.73 kg)

## 2015-05-19 ENCOUNTER — Ambulatory Visit
Admission: RE | Admit: 2015-05-19 | Discharge: 2015-05-19 | Disposition: A | Discharge status: Home / Self Care | Payer: BLUE CROSS/BLUE SHIELD | Source: Ambulatory Visit | Attending: Radiation Oncology | Admitting: Radiation Oncology

## 2015-05-19 ENCOUNTER — Telehealth: Payer: Self-pay | Admitting: *Deleted

## 2015-05-19 ENCOUNTER — Encounter: Payer: Self-pay | Admitting: Radiation Oncology

## 2015-05-19 DIAGNOSIS — Z510 Encounter for antineoplastic radiation therapy: Secondary | ICD-10-CM | POA: Diagnosis not present

## 2015-05-19 NOTE — Telephone Encounter (Signed)
  Oncology Nurse Navigator Documentation    Navigator Encounter Type: Telephone (05/19/15 1400)           Patient Visit Type: [id number] (05/19/15 1400) Treatment Phase: Final Radiation Tx (05/19/15 1400)                            Time Spent with Patient: 15 (05/19/15 1400)

## 2015-05-25 ENCOUNTER — Other Ambulatory Visit: Payer: Self-pay | Admitting: Endocrinology

## 2015-05-25 DIAGNOSIS — E049 Nontoxic goiter, unspecified: Secondary | ICD-10-CM

## 2015-05-29 ENCOUNTER — Ambulatory Visit (HOSPITAL_BASED_OUTPATIENT_CLINIC_OR_DEPARTMENT_OTHER): Payer: BLUE CROSS/BLUE SHIELD | Admitting: Oncology

## 2015-05-29 ENCOUNTER — Other Ambulatory Visit: Payer: Self-pay | Admitting: Oncology

## 2015-05-29 ENCOUNTER — Encounter: Payer: Self-pay | Admitting: *Deleted

## 2015-05-29 ENCOUNTER — Other Ambulatory Visit (HOSPITAL_BASED_OUTPATIENT_CLINIC_OR_DEPARTMENT_OTHER): Payer: BLUE CROSS/BLUE SHIELD

## 2015-05-29 ENCOUNTER — Ambulatory Visit (HOSPITAL_BASED_OUTPATIENT_CLINIC_OR_DEPARTMENT_OTHER): Payer: BLUE CROSS/BLUE SHIELD

## 2015-05-29 VITALS — BP 143/48 | HR 65 | Temp 97.4°F | Resp 18 | Ht 69.0 in | Wt 210.2 lb

## 2015-05-29 DIAGNOSIS — C50411 Malignant neoplasm of upper-outer quadrant of right female breast: Secondary | ICD-10-CM

## 2015-05-29 DIAGNOSIS — C50919 Malignant neoplasm of unspecified site of unspecified female breast: Secondary | ICD-10-CM

## 2015-05-29 DIAGNOSIS — D630 Anemia in neoplastic disease: Secondary | ICD-10-CM

## 2015-05-29 DIAGNOSIS — Z171 Estrogen receptor negative status [ER-]: Secondary | ICD-10-CM

## 2015-05-29 DIAGNOSIS — E119 Type 2 diabetes mellitus without complications: Secondary | ICD-10-CM

## 2015-05-29 DIAGNOSIS — Z452 Encounter for adjustment and management of vascular access device: Secondary | ICD-10-CM

## 2015-05-29 DIAGNOSIS — K760 Fatty (change of) liver, not elsewhere classified: Secondary | ICD-10-CM

## 2015-05-29 DIAGNOSIS — Z5112 Encounter for antineoplastic immunotherapy: Secondary | ICD-10-CM | POA: Diagnosis not present

## 2015-05-29 DIAGNOSIS — Z95828 Presence of other vascular implants and grafts: Secondary | ICD-10-CM

## 2015-05-29 DIAGNOSIS — C773 Secondary and unspecified malignant neoplasm of axilla and upper limb lymph nodes: Secondary | ICD-10-CM

## 2015-05-29 DIAGNOSIS — Z794 Long term (current) use of insulin: Secondary | ICD-10-CM

## 2015-05-29 LAB — COMPREHENSIVE METABOLIC PANEL
ALT: 20 U/L (ref 0–55)
AST: 20 U/L (ref 5–34)
Albumin: 3.4 g/dL — ABNORMAL LOW (ref 3.5–5.0)
Alkaline Phosphatase: 79 U/L (ref 40–150)
Anion Gap: 7 mEq/L (ref 3–11)
BUN: 29.9 mg/dL — ABNORMAL HIGH (ref 7.0–26.0)
CO2: 24 mEq/L (ref 22–29)
Calcium: 9.1 mg/dL (ref 8.4–10.4)
Chloride: 110 mEq/L — ABNORMAL HIGH (ref 98–109)
Creatinine: 1.4 mg/dL — ABNORMAL HIGH (ref 0.6–1.1)
EGFR: 41 mL/min/{1.73_m2} — ABNORMAL LOW (ref >90)
Glucose: 169 mg/dl — ABNORMAL HIGH (ref 70–140)
Potassium: 4.2 mEq/L (ref 3.5–5.1)
Sodium: 141 mEq/L (ref 136–145)
Total Bilirubin: 0.84 mg/dL (ref 0.20–1.20)
Total Protein: 6.2 g/dL — ABNORMAL LOW (ref 6.4–8.3)

## 2015-05-29 LAB — CBC WITH DIFFERENTIAL/PLATELET
BASO%: 0.2 % (ref 0.0–2.0)
Basophils Absolute: 0 10*3/uL (ref 0.0–0.1)
EOS%: 2.3 % (ref 0.0–7.0)
Eosinophils Absolute: 0.1 10*3/uL (ref 0.0–0.5)
HCT: 30 % — ABNORMAL LOW (ref 34.8–46.6)
HGB: 10 g/dL — ABNORMAL LOW (ref 11.6–15.9)
LYMPH%: 21.4 % (ref 14.0–49.7)
MCH: 31.5 pg (ref 25.1–34.0)
MCHC: 33.3 g/dL (ref 31.5–36.0)
MCV: 94.6 fL (ref 79.5–101.0)
MONO#: 0.5 10*3/uL (ref 0.1–0.9)
MONO%: 10 % (ref 0.0–14.0)
NEUT#: 3.1 10*3/uL (ref 1.5–6.5)
NEUT%: 66.1 % (ref 38.4–76.8)
Platelets: 176 10*3/uL (ref 145–400)
RBC: 3.17 10*6/uL — ABNORMAL LOW (ref 3.70–5.45)
RDW: 20.8 % — ABNORMAL HIGH (ref 11.2–14.5)
WBC: 4.7 10*3/uL (ref 3.9–10.3)
lymph#: 1 10*3/uL (ref 0.9–3.3)

## 2015-05-29 MED ORDER — SODIUM CHLORIDE 0.9 % IJ SOLN
10.0000 mL | INTRAMUSCULAR | Status: DC | PRN
  Filled 2015-05-29: qty 10

## 2015-05-29 MED ORDER — SODIUM CHLORIDE 0.9% FLUSH
10.0000 mL | INTRAVENOUS | Status: DC | PRN
  Administered 2015-05-29: 10 mL via INTRAVENOUS
  Filled 2015-05-29: qty 10

## 2015-05-29 MED ORDER — CAPECITABINE 500 MG PO TABS: ORAL_TABLET | ORAL | Status: DC

## 2015-05-29 MED ORDER — SODIUM CHLORIDE 0.9 % IV SOLN
Freq: Once | INTRAVENOUS | Status: AC
  Administered 2015-05-29: 10:00:00 via INTRAVENOUS

## 2015-05-29 MED ORDER — ACETAMINOPHEN 325 MG PO TABS
650.0000 mg | ORAL_TABLET | Freq: Once | ORAL | Status: AC
  Administered 2015-05-29: 650 mg via ORAL

## 2015-05-29 MED ORDER — DIPHENHYDRAMINE HCL 25 MG PO CAPS
25.0000 mg | ORAL_CAPSULE | Freq: Once | ORAL | Status: AC
  Administered 2015-05-29: 25 mg via ORAL

## 2015-05-29 MED ORDER — TRASTUZUMAB CHEMO INJECTION 440 MG
6.0000 mg/kg | Freq: Once | INTRAVENOUS | Status: AC
  Administered 2015-05-29: 567 mg via INTRAVENOUS
  Filled 2015-05-29: qty 27

## 2015-05-29 MED ORDER — ACETAMINOPHEN 325 MG PO TABS
ORAL_TABLET | ORAL | Status: AC
  Filled 2015-05-29: qty 2

## 2015-05-29 MED ORDER — CAPECITABINE 500 MG PO TABS
ORAL_TABLET | ORAL | Status: DC
Start: 2015-05-29 — End: 2015-10-02

## 2015-05-29 MED ORDER — HEPARIN SOD (PORK) LOCK FLUSH 100 UNIT/ML IV SOLN
500.0000 [IU] | Freq: Once | INTRAVENOUS | Status: DC | PRN
  Filled 2015-05-29: qty 5

## 2015-05-29 MED ORDER — DIPHENHYDRAMINE HCL 25 MG PO CAPS
ORAL_CAPSULE | ORAL | Status: AC
  Filled 2015-05-29: qty 1

## 2015-05-29 NOTE — Progress Notes (Signed)
[place]  Telephone:([phone]) [phone] [phone]   ID: Darlene [name] DOB: Dec 13, 1950  MR#: [id number]  SRP#:[id number]  Patient Care Team: Thressa Sheller, MD as PCP - General (Internal Medicine) Bryson Ha Himmelrich, RD as Dietitian (Bariatrics) Stark Klein, MD as Consulting Physician (General Surgery) Chauncey Cruel, MD as Consulting Physician (Oncology) Arloa Koh, MD as Consulting Physician (Radiation Oncology) Mauro Kaufmann, RN as Registered Nurse Rockwell Germany, RN as Registered Nurse Jacelyn Pi, MD as Consulting Physician (Endocrinology) Jacelyn Pi, MD as Consulting Physician (Endocrinology) Kem Boroughs, FNP as Nurse Practitioner (Family Medicine) PCP: Thressa Sheller, MD OTHER MD:  CHIEF COMPLAINT:  HER-2 positive, estrogen receptor negative breast cancer  CURRENT TREATMENT:  anti-HER-2 treatment; adjuvant radiation and capecitabine pending  BREAST CANCER HISTORY: From the original intake note:  "Darlene [name]" had screening mammography at her gynecologist suggestive of a possible abnormality in the right breast. However right diagnostic mammogram 04/12/2013 at [facility] was negative. More recently however, she noted a lump in her right breast and brought it to her gynecologist's attention. On 07/18/2014 the patient had bilateral diagnostic mammography with tomosynthesis at [facility]. The breast density was category B. In the right breast upper outer quadrant there was an area of focal asymmetry with indistinct margins.[name] Ultrasound was obtained and showed in addition to the mass in question, measuring 1.7 cm, an abnormal-appearing lymph node in the right axillary tail.  On 07/19/2014 the patient underwent biopsy of the right breast massin question as well as a right axillary lymph node. Both were positive for invasive ductal carcinoma, grade 2, estrogen and progesterone receptor negative, with an MIB-1 of 90%, and HER-2 pending. Incidentally the lymph  node biopsy showed a lymphocytic inflammatory component but no lymph node architecture.  Her subsequent history is as detailed below.  INTERVAL HISTORY:  Darlene [name] returns today for follow-up of her  HER-2 positivebreast cancer, accompanied by her husband Darlene [name].  She completed her radiation treatments late April 2017. Generally she did "okay" with that, though she did have some peeling and some fatigue. Nevertheless she is back to work full-time. She tolerated the capecitabine sensitization without any side effects whatsoever and in particular she had no skin changes, no mouth sores , and no diarrhea.   she is tolerating the trastuzumab with no side effects and she had an echocardiogram within the past 2 weeks which shows an excellent ejection fraction. She also had an ultrasound of the liver which shows hepatic steatosis and is slightly stiff liver. This means she is at higher risk of cirrhosis secondary to her hepatic steatosis.  REVIEW OF SYSTEMS: Darlene [name] has established herself with Dr. Chalmers Cater  Who palpated a slight [name] enlarged thyroid and has sent the patient up for an ultrasound of the thyroid later this week. The patient's A1c was 7.5. Darlene [name] is having some arthritis pains here and there which are not more persistent or intense than prior. She still has numbness in her toes.  PAST MEDICAL HISTORY: Past Medical History  Diagnosis Date  . Obesity   . Hypertension   . Hyperlipidemia   . Diabetes mellitus without complication ([place])     24+ years  . Eating disorder     binge eating  . Balance problem   . Arthritis     in fingers, shoulders  . Anemia     hx of  . Depression   . Ovarian cyst rupture     possible  . Clumsiness   . Breast cancer of upper-outer quadrant  of right female breast ([place]) 07/22/2014  . Breast cancer ([place])   . Anxiety   . Pneumonia   . Neuromuscular disorder ([place])     diabetic neuropathy  . Obstructive sleep apnea on CPAP     uses cpap nightly  . Heart murmur      PAST SURGICAL HISTORY: Past Surgical History  Procedure Laterality Date  . Carpal tunnel release Right   . Carpal tunnel release Left   . Breath tek h pylori N/A 09/15/2012    Procedure: BREATH TEK H PYLORI;  Surgeon: Pedro Earls, MD;  Location: [facility];  Service: General;  Laterality: N/A;  . Dupuytren contracture release    . Trigger finger release      x3  . Toe surgery  1970s    bone spur  . Rotator cuff repair Right 2005  . Gastric roux-en-y N/A 11/02/2012    Procedure: LAPAROSCOPIC ROUX-EN-Y GASTRIC;  Surgeon: Pedro Earls, MD;  Location: [facility];  Service: General;  Laterality: N/A;  . Bunionectomy Left 12/2013  . Portacath placement Left 08/02/2014    Procedure: INSERTION PORT-A-CATH;  Surgeon: Stark Klein, MD;  Location: [facility];  Service: General;  Laterality: Left;  . Breast lumpectomy with radioactive seed and sentinel lymph node biopsy Right 12/27/2014    Procedure: BREAST LUMPECTOMY WITH RADIOACTIVE SEED AND SENTINEL LYMPH NODE BIOPSY;  Surgeon: Stark Klein, MD;  Location: [facility];  Service: General;  Laterality: Right;  . Breast reduction surgery Bilateral 01/06/2015    Procedure: BILATERAL BREAST REDUCTION, RIGHT ONCOPLASTIC RECONSTRUCTION),LEFT BREAST REDUCTION FOR SYMMETRY;  Surgeon: [name], MD;  Location: [facility];  Service: Plastics;  Laterality: Bilateral;  . Mastectomy Right 02/15/2015    modified  . Modified mastectomy Right 02/15/2015    Procedure: RIGHT MODIFIED RADICAL MASTECTOMY;  Surgeon: Stark Klein, MD;  Location: [facility];  Service: General;  Laterality: Right;    FAMILY HISTORY Family History  Problem Relation Age of Onset  . CVA Mother   . Diabetes Father   . Heart failure Father   . Hypertension Sister   . Diabetes Brother   . Diabetes Paternal Uncle   . Breast cancer Paternal Grandmother    the patient's father died from complications of diabetes at age 30. The patient's mother died at  age 25 with a subarachnoid hemorrhage. The patient had one brother, one sister. The only breast cancer was the patient's paternal grandmother diagnosed in her 15s. There is no history of ovarian cancer in the family.  GYNECOLOGIC HISTORY:  Patient's last menstrual period was 10/22/2007.  menarche age 59, the patient is GX P0. She went through the change of life in 2011. She did not take hormone replacement.  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 Her husband Darlene [name] is a retired Visual merchandiser (he taught at [employer]).  Darlene [name] has 2 children from an earlier marriage, Rockey Situ who teaches in [place], and Amarea Macdowell,  who works at the [employer] in in [place]. He has 1 grand son, aged 51. The patient and her husband are not church attender's    ADVANCED DIRECTIVES:  Not in place   HEALTH MAINTENANCE: Social History  Substance Use Topics  . Smoking status: Never Smoker   . Smokeless tobacco: Never Used  . Alcohol Use: 0.0 oz/week    0 Standard drinks or equivalent per week     Comment: social  Colonoscopy:  May 2016  PAP:  Bone density: 07/05/2013 normal, with a T score of 0.4  Lipid panel:  Allergies  Allergen Reactions  . Peanuts [Peanut Oil] Itching    "Large amounts"  . Tape Hives and Rash    Current Outpatient Prescriptions  Medication Sig Dispense Refill  . acetaminophen (TYLENOL) 325 MG tablet Take 650 mg by mouth 2 (two) times daily as needed for moderate pain or headache. Reported on 04/17/2015    . calcium citrate (CALCITRATE - DOSED IN MG ELEMENTAL CALCIUM) 950 MG tablet Take 600 mg of elemental calcium by mouth daily.    . capecitabine (XELODA) 500 MG tablet Take 2 tablets by mouth after a meal 2 times a day on radiation days; do not take on days when you do not receive radiation 150 tablet 4  . diphenhydrAMINE (BENADRYL) 25 MG tablet Take 25 mg by mouth daily as needed for  allergies or sleep. Reported on 03/06/2015    . glucose blood (ONETOUCH VERIO) test strip 1 each by Other route as needed for other. Reported on 03/06/2015    . hyaluronate sodium (RADIAPLEXRX) GEL Apply 1 application topically 2 (two) times daily.    . Loratadine (CLARITIN) 10 MG CAPS Take 10 mg by mouth daily.     [name] losartan (COZAAR) 100 MG tablet Take 1 tablet by mouth daily. Reported on 03/06/2015    . Melatonin 5 MG TABS Take 5 mg by mouth at bedtime. Reported on 03/06/2015    . metoCLOPramide (REGLAN) 5 MG tablet Take 1 tablet (5 mg total) by mouth 3 (three) times daily before meals. 90 tablet 4  . Multiple Vitamin (MULTIVITAMIN WITH MINERALS) TABS tablet Take 1 tablet by mouth 2 (two) times daily. Reported on 09/15/35    . non-metallic deodorant Jethro Poling) MISC Apply 1 application topically daily as needed.    [name] NOVOLIN 70/30 (70-30) 100 UNIT/ML injection Inject 10 Units into the skin 2 (two) times daily with a meal. Reported on 03/06/2015    . Probiotic Product ([employer] PROBIOTIC PO) Take 1 capsule by mouth daily. Reported on 03/06/2015    . Trastuzumab (HERCEPTIN IV) Inject into the vein. Reported on 03/06/2015    . Vortioxetine HBr (TRINTELLIX) 20 MG TABS Take 20 mg by mouth daily. Reported on 03/06/2015     No current facility-administered medications for this visit.    OBJECTIVE:  Middle-aged white woman who appears stated age 79 Vitals:   05/29/15 0842  BP: 143/48  Pulse: 65  Temp: 97.4 F (36.3 C)  Resp: 18     Body mass index is 31.03 kg/(m^2).    ECOG FS:1 - Symptomatic but completely ambulatory  Sclerae unicteric, pupils round and equal Oropharynx clear and moist-- no thrush or other lesions No cervical or supraclavicular adenopathy Lungs no rales or rhonchi Heart regular rate and rhythm Abd soft, nontender, positive bowel sounds MSK no focal spinal tenderness, no upper extremity lymphedema Neuro: nonfocal, well oriented, appropriate affect Breasts: The right breast is status  post mastectomy and radiation. There is still disclamation present. There is no evidence of local recurrence. The right axilla is benign B of the left breast is status post reduction mammoplasty    LAB RESULTS:  CMP     Component Value Date/Time   NA 142 05/08/2015 0823   NA 137 02/16/2015 0418   K 4.0 05/08/2015 0823   K 4.3 02/16/2015 0418   CL 107 02/16/2015 0418   CO2 23 05/08/2015 0488  CO2 23 02/16/2015 0418   GLUCOSE 180* 05/08/2015 0823   GLUCOSE 218* 02/16/2015 0418   BUN 27.6* 05/08/2015 0823   BUN 18 02/16/2015 0418   CREATININE 1.2* 05/08/2015 0823   CREATININE 1.20* 02/16/2015 0418   CREATININE 1.10 02/18/2013 0827   CALCIUM 9.3 05/08/2015 0823   CALCIUM 8.4* 02/16/2015 0418   PROT 6.3* 05/08/2015 0823   PROT 6.0* 12/18/2014 1540   ALBUMIN 3.4* 05/08/2015 0823   ALBUMIN 3.6 12/18/2014 1540   AST 20 05/08/2015 0823   AST 21 12/18/2014 1540   ALT 20 05/08/2015 0823   ALT 22 12/18/2014 1540   ALKPHOS 70 05/08/2015 0823   ALKPHOS 73 12/18/2014 1540   BILITOT 0.79 05/08/2015 0823   BILITOT 1.0 12/18/2014 1540   GFRNONAA 47* 02/16/2015 0418   GFRNONAA 55* 02/18/2013 0827   GFRAA 55* 02/16/2015 0418   GFRAA 63 02/18/2013 0827    INo results found for: SPEP, UPEP  Lab Results  Component Value Date   WBC 4.7 05/29/2015   NEUTROABS 3.1 05/29/2015   HGB 10.0* 05/29/2015   HCT 30.0* 05/29/2015   MCV 94.6 05/29/2015   PLT 176 05/29/2015      Chemistry      Component Value Date/Time   NA 142 05/08/2015 0823   NA 137 02/16/2015 0418   K 4.0 05/08/2015 0823   K 4.3 02/16/2015 0418   CL 107 02/16/2015 0418   CO2 23 05/08/2015 0823   CO2 23 02/16/2015 0418   BUN 27.6* 05/08/2015 0823   BUN 18 02/16/2015 0418   CREATININE 1.2* 05/08/2015 0823   CREATININE 1.20* 02/16/2015 0418   CREATININE 1.10 02/18/2013 0827      Component Value Date/Time   CALCIUM 9.3 05/08/2015 0823   CALCIUM 8.4* 02/16/2015 0418   ALKPHOS 70 05/08/2015 0823   ALKPHOS 73  12/18/2014 1540   AST 20 05/08/2015 0823   AST 21 12/18/2014 1540   ALT 20 05/08/2015 0823   ALT 22 12/18/2014 1540   BILITOT 0.79 05/08/2015 0823   BILITOT 1.0 12/18/2014 1540       No results found for: LABCA2  No components found for: [id number]  No results for input(s): INR in the last 168 hours.  Urinalysis    Component Value Date/Time   COLORURINE YELLOW 12/18/2014 [place] 12/18/2014 1635   LABSPEC 1.023 12/18/2014 1635   PHURINE 5.0 12/18/2014 1635   GLUCOSEU NEGATIVE 12/18/2014 1635   HGBUR NEGATIVE 12/18/2014 1635   BILIRUBINUR NEGATIVE 12/18/2014 1635   BILIRUBINUR neg 06/29/2013 [place] 12/18/2014 1635   PROTEINUR 30* 12/18/2014 1635   PROTEINUR trace 06/29/2013 1017   UROBILINOGEN negative 06/29/2013 1017   NITRITE NEGATIVE 12/18/2014 1635   NITRITE neg 06/29/2013 1017   LEUKOCYTESUR NEGATIVE 12/18/2014 1635    STUDIES: US Abdomen Complete W/elastography  05/10/2015  CLINICAL DATA:  History of breast cancer. EXAM: ULTRASOUND ABDOMEN COMPLETE ULTRASOUND HEPATIC ELASTOGRAPHY TECHNIQUE: Sonography of the upper abdomen was performed. In addition, ultrasound elastography evaluation of the liver was performed. A region of interest was placed within the right lobe of the liver. Following application of a compressive sonographic pulse, shear waves were detected in the adjacent hepatic tissue and the shear wave velocity was calculated. Multiple assessments were performed at the selected site. Median shear wave velocity is correlated to a Metavir fibrosis score. COMPARISON:  None. FINDINGS: ULTRASOUND ABDOMEN Gallbladder: Multiple stones in the gallbladder measuring up to 8 mm. No gallbladder wall thickening.  No sonographic Murphy sign noted by sonographer. Common bile duct: Diameter: 4.8 mm Liver: No focal lesion identified. Increased parenchymal echogenicity. IVC: No abnormality visualized. Pancreas: Visualized portion unremarkable. Spleen:  Size and appearance within normal limits. Right Kidney: Length: 12.2 cm. Echogenicity within normal limits. No mass or hydronephrosis visualized. Left Kidney: Length: 12.7 cm. Echogenicity within normal limits. No mass or hydronephrosis visualized. Abdominal aorta: No aneurysm visualized. Other findings: None. ULTRASOUND HEPATIC ELASTOGRAPHY Device: Siemens Helix VTQ Patient position:  Supine Transducer 4 V1 Number of measurements:  10 Hepatic Segment:  8 Median velocity:   2 0.79  m/sec IQR: 0.95 IQR/Median velocity ratio 0.34 Corresponding Metavir fibrosis score:  F3/F4 Risk of fibrosis: High Limitations of exam: None Pertinent findings noted on other imaging exams: Difficult to consistently visualize liver landmarks. Please note that abnormal shear wave velocities may also be identified in clinical settings other than with hepatic fibrosis, such as: acute hepatitis, elevated right heart and central venous pressures including use of beta blockers, veno-occlusive disease (Budd-Chiari), infiltrative processes such as mastocytosis/amyloidosis/infiltrative tumor, extrahepatic cholestasis, in the post-prandial state, and liver transplantation. Correlation with patient history, laboratory data, and clinical condition recommended. IMPRESSION: 1. Hepatic steatosis. 2. Gallstones. Median hepatic shear wave velocity is calculated at 2.79 m/sec. Corresponding Metavir fibrosis score is F3/F4. Risk of fibrosis is high. Follow-up:  Follow-up advised. Electronically Signed   By: Kerby Moors M.D.   On: 05/10/2015 11:02    ASSESSMENT: 65 y.o. [place] woman status post right breast and right axillary lymph node biopsy 07/19/2014 for a cT2  pN1, stage IIB  invasive ductal carcinoma, grade 2, estrogen and progesterone receptor negative, with an MIB-1 of 90%, and HER-2 amplified  (1) neoadjuvant chemotherapy started 08/08/2014, consisting of carboplatin, docetaxel, trastuzumab and pertuzumab given every 3 weeks 6,  completed 11/21/2014  (a) breast MRI after initial 3 cycles shows a significant response  (b) breast MRI after 6 cycles shows a complete radiologic response   (2) trastuzumab to be continued through September 2017 (to end at end of capecitabine therapy)  (a) echo 02/13/2015 shows an EF of 60-65%  (b)  Echo 05/09/2015 shows an ejection fraction of 60-65%  (3) right lumpectomy with sentinel lymph node biopsy on 12/27/14 showed a residual  ypT2 ypN1a, stage IIB invasive ductal carcinoma, grade 2, with repeat estrogen and progesterone receptors again negative  (4) status post right oncoplastic surgery (with left reduction mammoplasty) 01/06/2015 showing in the right breast multiple foci of intralymphatic carcinoma in random sections  (5)  Right modified radical mastectomy 02/15/2014 showed no residual invasive disease; there was DCIS but margins were negative; all 15 axillary lymph nodes were clear  (a)  The patient intends to have eventual reconstruction.  (6)  Postmastectomy radiation completed 05/18/2015 with capecitabine sensitization   (7) adjuvant capecitabine to continue through August 2017   (8) hepatic steatosis with increased fibrosis score (at risk for cirrhosis)   PLAN: Darlene [name] completed her radiation treatments without major complications.she is now ready to start full dose capecitabine.  She is going on a burning trip and of course she still has significant desquamation. Accordingly she will return here on May 22 and assuming her labs are fine she will start capecitabine on May 23. She will receive 1.5 g twice daily for one week and then she will be off the next week. She is going to see [name] on May 29, the day she receives her next trastuzumab treatment, to assess tolerance. If all goes well we will  continue the capecitabine through September checking labs every 2 weeks on day 0 of each cycle.  Of course she will continue the trastuzumab as well. Originally we were going to end in  July, but I would prefer to continue it through September so it matches the capecitabine and they can synergize.  Darlene [name] is beginning to feel some symptoms of posttraumatic stress. I think it would be very helpful if she participated in the finding normal group.I have also encouraged her to increase her exercise program  She intends to have reconstruction, most likely TRAM flap, most likely November. We will set her up with Dr. Iran Planas at some point this summerfor further discussion.  Otherwise [place] for any problems that may develop before her next visit here.      Chauncey Cruel, MD   05/29/2015 8:51 AM

## 2015-05-29 NOTE — Patient Instructions (Signed)
[facility] Discharge Instructions for Patients Receiving Chemotherapy  Today you received the following chemotherapy agents Herceptin  To help prevent nausea and vomiting after your treatment, we encourage you to take your nausea medication    If you develop nausea and vomiting that is not controlled by your nausea medication, call the clinic.   BELOW ARE SYMPTOMS THAT SHOULD BE REPORTED IMMEDIATELY:  *FEVER GREATER THAN 100.5 F  *CHILLS WITH OR WITHOUT FEVER  NAUSEA AND VOMITING THAT IS NOT CONTROLLED WITH YOUR NAUSEA MEDICATION  *UNUSUAL SHORTNESS OF BREATH  *UNUSUAL BRUISING OR BLEEDING  TENDERNESS IN MOUTH AND THROAT WITH OR WITHOUT PRESENCE OF ULCERS  *URINARY PROBLEMS  *BOWEL PROBLEMS  UNUSUAL RASH Items with * indicate a potential emergency and should be followed up as soon as possible.  Feel free to call the clinic you have any questions or concerns. The clinic phone number is [phone].  Please show the CHEMO ALERT CARD at check-in to the Emergency Department and triage nurse.   

## 2015-05-30 ENCOUNTER — Telehealth: Payer: Self-pay

## 2015-05-30 ENCOUNTER — Telehealth: Payer: Self-pay | Admitting: Oncology

## 2015-05-30 ENCOUNTER — Other Ambulatory Visit: Payer: Self-pay

## 2015-05-30 NOTE — Telephone Encounter (Signed)
Patient called stating that she was not scheduled for her herceptin on 06/20/15 with her flush and lab appt.  POF placed.  Patient requested all the appt to be earlier- writer placed this in the POF.

## 2015-05-30 NOTE — Telephone Encounter (Signed)
Pt called to r/s appt to Tuesday...done...the patient going to be on vacation

## 2015-05-31 ENCOUNTER — Ambulatory Visit
Admission: RE | Admit: 2015-05-31 | Discharge: 2015-05-31 | Disposition: A | Discharge status: Home / Self Care | Payer: BLUE CROSS/BLUE SHIELD | Source: Ambulatory Visit | Attending: Endocrinology | Admitting: Endocrinology

## 2015-05-31 DIAGNOSIS — E049 Nontoxic goiter, unspecified: Secondary | ICD-10-CM

## 2015-06-07 NOTE — Progress Notes (Signed)
  Radiation Oncology         [phone] ________________________________  Name: Darlene [name] MRN: [id number]  Date: 05/19/2015  DOB: 01-Feb-1950  End of Treatment Note  ICD-9-CM ICD-10-CM     1. Breast cancer of upper-outer quadrant of right female breast ([place]) 174.4 C50.411     DIAGNOSIS: ypT2 ypN1a, stage IIB invasive ductal carcinoma, grade 2     Indication for treatment:  Post operative       Radiation treatment dates:   04/05/2015-05/19/2015  Site/dose:    1. The right chest wall was treated to 50.4 Gy in 28 fractions at 1.8 Gy per fraction. 2. The right mastectomy scar was treated to 10 Gy in 5 fractions at 2 Gy per fraction.   Beams/energy:    1. 3D-Conformal  // 6X 2. En face // 9 MeV  Narrative: The patient tolerated radiation treatment relatively well.   She experienced some hyperpigmentation changes and some erythema in the right chest wall with no skin breakdown.   Plan: The patient has completed radiation treatment. The patient will return to radiation oncology clinic for routine followup in one month. I advised them to call or return sooner if they have any questions or concerns related to their recovery or treatment.  -----------------------------------  Blair Promise, PhD, MD  This document serves as a record of services personally performed by Gery Pray, MD. It was created on his behalf by Arlyce Harman, a trained medical scribe. The creation of this record is based on the scribe's personal observations and the provider's statements to them. This document has been checked and approved by the attending provider.

## 2015-06-12 ENCOUNTER — Other Ambulatory Visit (HOSPITAL_BASED_OUTPATIENT_CLINIC_OR_DEPARTMENT_OTHER): Payer: BLUE CROSS/BLUE SHIELD

## 2015-06-12 DIAGNOSIS — C50411 Malignant neoplasm of upper-outer quadrant of right female breast: Secondary | ICD-10-CM | POA: Diagnosis not present

## 2015-06-12 LAB — COMPREHENSIVE METABOLIC PANEL
ALT: 35 U/L (ref 0–55)
AST: 30 U/L (ref 5–34)
Albumin: 3.4 g/dL — ABNORMAL LOW (ref 3.5–5.0)
Alkaline Phosphatase: 79 U/L (ref 40–150)
Anion Gap: 8 mEq/L (ref 3–11)
BUN: 24.5 mg/dL (ref 7.0–26.0)
CO2: 24 mEq/L (ref 22–29)
Calcium: 9 mg/dL (ref 8.4–10.4)
Chloride: 108 mEq/L (ref 98–109)
Creatinine: 1.1 mg/dL (ref 0.6–1.1)
EGFR: 51 mL/min/{1.73_m2} — ABNORMAL LOW (ref >90)
Glucose: 165 mg/dl — ABNORMAL HIGH (ref 70–140)
Potassium: 4.3 mEq/L (ref 3.5–5.1)
Sodium: 140 mEq/L (ref 136–145)
Total Bilirubin: 0.73 mg/dL (ref 0.20–1.20)
Total Protein: 6.3 g/dL — ABNORMAL LOW (ref 6.4–8.3)

## 2015-06-12 LAB — CBC WITH DIFFERENTIAL/PLATELET
BASO%: 0.2 % (ref 0.0–2.0)
Basophils Absolute: 0 10*3/uL (ref 0.0–0.1)
EOS%: 1.7 % (ref 0.0–7.0)
Eosinophils Absolute: 0.1 10*3/uL (ref 0.0–0.5)
HCT: 30.3 % — ABNORMAL LOW (ref 34.8–46.6)
HGB: 10.1 g/dL — ABNORMAL LOW (ref 11.6–15.9)
LYMPH%: 21.8 % (ref 14.0–49.7)
MCH: 31.5 pg (ref 25.1–34.0)
MCHC: 33.3 g/dL (ref 31.5–36.0)
MCV: 94.4 fL (ref 79.5–101.0)
MONO#: 0.4 10*3/uL (ref 0.1–0.9)
MONO%: 7.8 % (ref 0.0–14.0)
NEUT#: 3.6 10*3/uL (ref 1.5–6.5)
NEUT%: 68.5 % (ref 38.4–76.8)
Platelets: 179 10*3/uL (ref 145–400)
RBC: 3.21 10*6/uL — ABNORMAL LOW (ref 3.70–5.45)
RDW: 17.9 % — ABNORMAL HIGH (ref 11.2–14.5)
WBC: 5.2 10*3/uL (ref 3.9–10.3)
lymph#: 1.1 10*3/uL (ref 0.9–3.3)

## 2015-06-13 ENCOUNTER — Other Ambulatory Visit: Payer: Self-pay | Admitting: Endocrinology

## 2015-06-13 DIAGNOSIS — E049 Nontoxic goiter, unspecified: Secondary | ICD-10-CM

## 2015-06-20 ENCOUNTER — Encounter: Payer: Self-pay | Admitting: Oncology

## 2015-06-20 ENCOUNTER — Ambulatory Visit (HOSPITAL_BASED_OUTPATIENT_CLINIC_OR_DEPARTMENT_OTHER): Payer: BLUE CROSS/BLUE SHIELD | Admitting: Nurse Practitioner

## 2015-06-20 ENCOUNTER — Ambulatory Visit (HOSPITAL_BASED_OUTPATIENT_CLINIC_OR_DEPARTMENT_OTHER): Payer: BLUE CROSS/BLUE SHIELD

## 2015-06-20 ENCOUNTER — Telehealth: Payer: Self-pay | Admitting: Oncology

## 2015-06-20 ENCOUNTER — Other Ambulatory Visit (HOSPITAL_BASED_OUTPATIENT_CLINIC_OR_DEPARTMENT_OTHER): Payer: BLUE CROSS/BLUE SHIELD

## 2015-06-20 VITALS — BP 137/48 | HR 71 | Temp 98.1°F | Resp 17 | Ht 69.0 in | Wt 219.7 lb

## 2015-06-20 DIAGNOSIS — C773 Secondary and unspecified malignant neoplasm of axilla and upper limb lymph nodes: Secondary | ICD-10-CM

## 2015-06-20 DIAGNOSIS — Z5112 Encounter for antineoplastic immunotherapy: Secondary | ICD-10-CM | POA: Diagnosis not present

## 2015-06-20 DIAGNOSIS — C50411 Malignant neoplasm of upper-outer quadrant of right female breast: Secondary | ICD-10-CM

## 2015-06-20 DIAGNOSIS — Z171 Estrogen receptor negative status [ER-]: Secondary | ICD-10-CM | POA: Diagnosis not present

## 2015-06-20 LAB — COMPREHENSIVE METABOLIC PANEL
ALT: 24 U/L (ref 0–55)
AST: 23 U/L (ref 5–34)
Albumin: 3.5 g/dL (ref 3.5–5.0)
Alkaline Phosphatase: 75 U/L (ref 40–150)
Anion Gap: 6 mEq/L (ref 3–11)
BUN: 34.7 mg/dL — ABNORMAL HIGH (ref 7.0–26.0)
CO2: 26 mEq/L (ref 22–29)
Calcium: 9.2 mg/dL (ref 8.4–10.4)
Chloride: 108 mEq/L (ref 98–109)
Creatinine: 1.3 mg/dL — ABNORMAL HIGH (ref 0.6–1.1)
EGFR: 43 mL/min/{1.73_m2} — ABNORMAL LOW (ref >90)
Glucose: 144 mg/dl — ABNORMAL HIGH (ref 70–140)
Potassium: 4.7 mEq/L (ref 3.5–5.1)
Sodium: 140 mEq/L (ref 136–145)
Total Bilirubin: 0.85 mg/dL (ref 0.20–1.20)
Total Protein: 6.3 g/dL — ABNORMAL LOW (ref 6.4–8.3)

## 2015-06-20 LAB — CBC WITH DIFFERENTIAL/PLATELET
BASO%: 0.2 % (ref 0.0–2.0)
Basophils Absolute: 0 10*3/uL (ref 0.0–0.1)
EOS%: 2.4 % (ref 0.0–7.0)
Eosinophils Absolute: 0.2 10*3/uL (ref 0.0–0.5)
HCT: 30.4 % — ABNORMAL LOW (ref 34.8–46.6)
HGB: 10.4 g/dL — ABNORMAL LOW (ref 11.6–15.9)
LYMPH%: 23 % (ref 14.0–49.7)
MCH: 32.5 pg (ref 25.1–34.0)
MCHC: 34.2 g/dL (ref 31.5–36.0)
MCV: 95 fL (ref 79.5–101.0)
MONO#: 0.5 10*3/uL (ref 0.1–0.9)
MONO%: 7.3 % (ref 0.0–14.0)
NEUT#: 4.1 10*3/uL (ref 1.5–6.5)
NEUT%: 67.1 % (ref 38.4–76.8)
Platelets: 186 10*3/uL (ref 145–400)
RBC: 3.2 10*6/uL — ABNORMAL LOW (ref 3.70–5.45)
RDW: 16.5 % — ABNORMAL HIGH (ref 11.2–14.5)
WBC: 6.1 10*3/uL (ref 3.9–10.3)
lymph#: 1.4 10*3/uL (ref 0.9–3.3)

## 2015-06-20 MED ORDER — ACETAMINOPHEN 325 MG PO TABS
ORAL_TABLET | ORAL | Status: AC
  Filled 2015-06-20: qty 2

## 2015-06-20 MED ORDER — DIPHENHYDRAMINE HCL 25 MG PO CAPS
25.0000 mg | ORAL_CAPSULE | Freq: Once | ORAL | Status: AC
  Administered 2015-06-20: 25 mg via ORAL

## 2015-06-20 MED ORDER — ACETAMINOPHEN 325 MG PO TABS
650.0000 mg | ORAL_TABLET | Freq: Once | ORAL | Status: AC
  Administered 2015-06-20: 650 mg via ORAL

## 2015-06-20 MED ORDER — HEPARIN SOD (PORK) LOCK FLUSH 100 UNIT/ML IV SOLN
500.0000 [IU] | Freq: Once | INTRAVENOUS | Status: AC | PRN
  Administered 2015-06-20: 500 [IU]
  Filled 2015-06-20: qty 5

## 2015-06-20 MED ORDER — SODIUM CHLORIDE 0.9 % IJ SOLN
10.0000 mL | INTRAMUSCULAR | Status: DC | PRN
  Administered 2015-06-20: 10 mL
  Filled 2015-06-20: qty 10

## 2015-06-20 MED ORDER — DIPHENHYDRAMINE HCL 25 MG PO CAPS
ORAL_CAPSULE | ORAL | Status: AC
  Filled 2015-06-20: qty 1

## 2015-06-20 MED ORDER — TRASTUZUMAB CHEMO INJECTION 440 MG
6.0000 mg/kg | Freq: Once | INTRAVENOUS | Status: AC
  Administered 2015-06-20: 567 mg via INTRAVENOUS
  Filled 2015-06-20: qty 27

## 2015-06-20 MED ORDER — SODIUM CHLORIDE 0.9 % IV SOLN
Freq: Once | INTRAVENOUS | Status: AC
  Administered 2015-06-20: 15:00:00 via INTRAVENOUS

## 2015-06-20 NOTE — Progress Notes (Signed)
Darlene [name]  [phone] [phone]   ID: EVANIE [name] DOB: 1950/04/22  MR#: [id number]  NTI#:[id number]  Patient Care Team: Thressa Sheller, MD as PCP - General (Internal Medicine) Bryson Ha Himmelrich, RD as Dietitian (Bariatrics) Stark Klein, MD as Consulting Physician (General Surgery) Chauncey Cruel, MD as Consulting Physician (Oncology) Arloa Koh, MD as Consulting Physician (Radiation Oncology) Mauro Kaufmann, RN as Registered Nurse Rockwell Germany, RN as Registered Nurse Jacelyn Pi, MD as Consulting Physician (Endocrinology) Jacelyn Pi, MD as Consulting Physician (Endocrinology) Kem Boroughs, FNP as Nurse Practitioner (Family Medicine) PCP: Thressa Sheller, MD OTHER MD:  CHIEF COMPLAINT:  HER-2 positive, estrogen receptor negative breast cancer  CURRENT TREATMENT:  anti-HER-2 treatment; adjuvant radiation and capecitabine pending  BREAST CANCER HISTORY: From the original intake note:  "Darlene [name]" had screening mammography at her gynecologist suggestive of a possible abnormality in the right breast. However right diagnostic mammogram 04/12/2013 at [facility] was negative. More recently however, she noted a lump in her right breast and brought it to her gynecologist's attention. On 07/18/2014 the patient had bilateral diagnostic mammography with tomosynthesis at [facility]. The breast density was category B. In the right breast upper outer quadrant there was an area of focal asymmetry with indistinct margins.[name] Ultrasound was obtained and showed in addition to the mass in question, measuring 1.7 cm, an abnormal-appearing lymph node in the right axillary tail.  On 07/19/2014 the patient underwent biopsy of the right breast massin question as well as a right axillary lymph node. Both were positive for invasive ductal carcinoma, grade 2, estrogen and progesterone receptor negative, with an MIB-1 of 90%, and HER-2 pending. Incidentally the lymph  node biopsy showed a lymphocytic inflammatory component but no lymph node architecture.  Her subsequent history is as detailed below.  INTERVAL HISTORY:  Darlene [name] returns today for follow-up of her  HER-2 positivebreast cancer, accompanied by her husband Darlene [name]. She has been on full dose (1.5g BID) capecitabine for a full week now. Today is day 7, so she will have the next 7 days off for rest. So far there are no issues. No rashes, mouth sores, or diarrhea.   REVIEW OF SYSTEMS: Darlene [name] is anxious. She is to have a biopsy of some nodules in her thyroid discovered earlier this week. She wonders if it will be cancer. Otherwise she has few physical complaints. She is occasionally fatigued. She has some arthritis. There is residual neuropathy in her toes. A detailed review of systems is otherwise stable.  PAST MEDICAL HISTORY: Past Medical History  Diagnosis Date  . Obesity   . Hypertension   . Hyperlipidemia   . Diabetes mellitus without complication ([place])     86+ years  . Eating disorder     binge eating  . Balance problem   . Arthritis     in fingers, shoulders  . Anemia     hx of  . Depression   . Ovarian cyst rupture     possible  . Clumsiness   . Breast cancer of upper-outer quadrant of right female breast ([place]) 07/22/2014  . Breast cancer ([place])   . Anxiety   . Pneumonia   . Neuromuscular disorder ([place])     diabetic neuropathy  . Obstructive sleep apnea on CPAP     uses cpap nightly  . Heart murmur   . Radiation 04/05/15-05/19/15    right chest wall 50.4 Gy, mastectomy scar 10 Gy    PAST SURGICAL HISTORY: Past Surgical History  Procedure  Laterality Date  . Carpal tunnel release Right   . Carpal tunnel release Left   . Breath tek h pylori N/A 09/15/2012    Procedure: BREATH TEK H PYLORI;  Surgeon: Pedro Earls, MD;  Location: [facility];  Service: General;  Laterality: N/A;  . Dupuytren contracture release    . Trigger finger release      x3  . Toe surgery  1970s     bone spur  . Rotator cuff repair Right 2005  . Gastric roux-en-y N/A 11/02/2012    Procedure: LAPAROSCOPIC ROUX-EN-Y GASTRIC;  Surgeon: Pedro Earls, MD;  Location: [facility];  Service: General;  Laterality: N/A;  . Bunionectomy Left 12/2013  . Portacath placement Left 08/02/2014    Procedure: INSERTION PORT-A-CATH;  Surgeon: Stark Klein, MD;  Location: [facility];  Service: General;  Laterality: Left;  . Breast lumpectomy with radioactive seed and sentinel lymph node biopsy Right 12/27/2014    Procedure: BREAST LUMPECTOMY WITH RADIOACTIVE SEED AND SENTINEL LYMPH NODE BIOPSY;  Surgeon: Stark Klein, MD;  Location: [facility];  Service: General;  Laterality: Right;  . Breast reduction surgery Bilateral 01/06/2015    Procedure: BILATERAL BREAST REDUCTION, RIGHT ONCOPLASTIC RECONSTRUCTION),LEFT BREAST REDUCTION FOR SYMMETRY;  Surgeon: [name], MD;  Location: [facility];  Service: Plastics;  Laterality: Bilateral;  . Mastectomy Right 02/15/2015    modified  . Modified mastectomy Right 02/15/2015    Procedure: RIGHT MODIFIED RADICAL MASTECTOMY;  Surgeon: Stark Klein, MD;  Location: [facility];  Service: General;  Laterality: Right;    FAMILY HISTORY Family History  Problem Relation Age of Onset  . CVA Mother   . Diabetes Father   . Heart failure Father   . Hypertension Sister   . Diabetes Brother   . Diabetes Paternal Uncle   . Breast cancer Paternal Grandmother    the patient's father died from complications of diabetes at age 60. The patient's mother died at age 74 with a subarachnoid hemorrhage. The patient had one brother, one sister. The only breast cancer was the patient's paternal grandmother diagnosed in her 57s. There is no history of ovarian cancer in the family.  GYNECOLOGIC HISTORY:  Patient's last menstrual period was 10/22/2007.  menarche age 65, the patient is GX P0. She went through the change of life in 2011. She did not take hormone  replacement.  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 Her husband Darlene [name] is a retired Visual merchandiser (he taught at [employer]).  Darlene [name] has 2 children from an earlier marriage, Rockey Situ who teaches in [place], and Willodene Stallings,  who works at the [employer] in in [place]. He has 1 grand son, aged 26. The patient and her husband are not church attender's    ADVANCED DIRECTIVES:  Not in place   HEALTH MAINTENANCE: Social History  Substance Use Topics  . Smoking status: Never Smoker   . Smokeless tobacco: Never Used  . Alcohol Use: 0.0 oz/week    0 Standard drinks or equivalent per week     Comment: social     Colonoscopy:  May 2016  PAP:  Bone density: 07/05/2013 normal, with a T score of 0.4  Lipid panel:  Allergies  Allergen Reactions  . Peanuts [Peanut Oil] Itching    "Large amounts"  . Tape Hives and Rash    Current Outpatient Prescriptions  Medication Sig Dispense Refill  . acetaminophen (TYLENOL)  325 MG tablet Take 650 mg by mouth 2 (two) times daily as needed for moderate pain or headache. Reported on 04/17/2015    . calcium citrate (CALCITRATE - DOSED IN MG ELEMENTAL CALCIUM) 950 MG tablet Take 600 mg of elemental calcium by mouth daily.    . capecitabine (XELODA) 500 MG tablet Take 3 tablets by mouth after a meal 2 times a day seven days on, seven days off 84 tablet 4  . diphenhydrAMINE (BENADRYL) 25 MG tablet Take 25 mg by mouth daily as needed for allergies or sleep. Reported on 03/06/2015    . glucose blood (ONETOUCH VERIO) test strip 1 each by Other route as needed for other. Reported on 03/06/2015    . Loratadine (CLARITIN) 10 MG CAPS Take 10 mg by mouth daily.     [name] losartan (COZAAR) 100 MG tablet Take 1 tablet by mouth daily. Reported on 03/06/2015    . Melatonin 5 MG TABS Take 5 mg by mouth at bedtime. Reported on 03/06/2015    . metoCLOPramide (REGLAN) 5 MG tablet Take 1 tablet  (5 mg total) by mouth 3 (three) times daily before meals. 90 tablet 4  . Multiple Vitamin (MULTIVITAMIN WITH MINERALS) TABS tablet Take 1 tablet by mouth 2 (two) times daily. Reported on 03/06/2015    . NOVOLIN 70/30 (70-30) 100 UNIT/ML injection Inject 10 Units into the skin 2 (two) times daily with a meal. Reported on 03/06/2015    . Probiotic Product ([employer] PROBIOTIC PO) Take 1 capsule by mouth daily. Reported on 03/06/2015    . Trastuzumab (HERCEPTIN IV) Inject into the vein. Reported on 03/06/2015    . Vortioxetine HBr (TRINTELLIX) 20 MG TABS Take 20 mg by mouth daily. Reported on 03/06/2015     No current facility-administered medications for this visit.   Facility-Administered Medications Ordered in Other Visits  Medication Dose Route Frequency Provider Last Rate Last Dose  . heparin lock flush 100 unit/mL  500 Units Intracatheter Once PRN Chauncey Cruel, MD      . sodium chloride 0.9 % injection 10 mL  10 mL Intracatheter PRN Chauncey Cruel, MD        OBJECTIVE:  Middle-aged white woman who appears stated age 10 Vitals:   06/20/15 1318  BP: 137/48  Pulse: 71  Temp: 98.1 F (36.7 C)  Resp: 17     Body mass index is 32.43 kg/(m^2).    ECOG FS:1 - Symptomatic but completely ambulatory  Skin: warm, dry  HEENT: sclerae anicteric, conjunctivae pink, oropharynx clear. No thrush or mucositis.  Lymph Nodes: No cervical or supraclavicular lymphadenopathy  Lungs: clear to auscultation bilaterally, no rales, wheezes, or rhonci  Heart: regular rate and rhythm  Abdomen: round, soft, non tender, positive bowel sounds  Musculoskeletal: No focal spinal tenderness, no peripheral edema  Neuro: non focal, well oriented, positive affect  Breasts:deferred  LAB RESULTS:  CMP     Component Value Date/Time   NA 140 06/20/2015 1307   NA 137 02/16/2015 0418   K 4.7 06/20/2015 1307   K 4.3 02/16/2015 0418   CL 107 02/16/2015 0418   CO2 26 06/20/2015 1307   CO2 23 02/16/2015 0418    GLUCOSE 144* 06/20/2015 1307   GLUCOSE 218* 02/16/2015 0418   BUN 34.7* 06/20/2015 1307   BUN 18 02/16/2015 0418   CREATININE 1.3* 06/20/2015 1307   CREATININE 1.20* 02/16/2015 0418   CREATININE 1.10 02/18/2013 0827   CALCIUM 9.2 06/20/2015 1307   CALCIUM 8.4* 02/16/2015  0418   PROT 6.3* 06/20/2015 1307   PROT 6.0* 12/18/2014 1540   ALBUMIN 3.5 06/20/2015 1307   ALBUMIN 3.6 12/18/2014 1540   AST 23 06/20/2015 1307   AST 21 12/18/2014 1540   ALT 24 06/20/2015 1307   ALT 22 12/18/2014 1540   ALKPHOS 75 06/20/2015 1307   ALKPHOS 73 12/18/2014 1540   BILITOT 0.85 06/20/2015 1307   BILITOT 1.0 12/18/2014 1540   GFRNONAA 47* 02/16/2015 0418   GFRNONAA 55* 02/18/2013 0827   GFRAA 55* 02/16/2015 0418   GFRAA 63 02/18/2013 0827    INo results found for: SPEP, UPEP  Lab Results  Component Value Date   WBC 6.1 06/20/2015   NEUTROABS 4.1 06/20/2015   HGB 10.4* 06/20/2015   HCT 30.4* 06/20/2015   MCV 95.0 06/20/2015   PLT 186 06/20/2015      Chemistry      Component Value Date/Time   NA 140 06/20/2015 1307   NA 137 02/16/2015 0418   K 4.7 06/20/2015 1307   K 4.3 02/16/2015 0418   CL 107 02/16/2015 0418   CO2 26 06/20/2015 1307   CO2 23 02/16/2015 0418   BUN 34.7* 06/20/2015 1307   BUN 18 02/16/2015 0418   CREATININE 1.3* 06/20/2015 1307   CREATININE 1.20* 02/16/2015 0418   CREATININE 1.10 02/18/2013 0827      Component Value Date/Time   CALCIUM 9.2 06/20/2015 1307   CALCIUM 8.4* 02/16/2015 0418   ALKPHOS 75 06/20/2015 1307   ALKPHOS 73 12/18/2014 1540   AST 23 06/20/2015 1307   AST 21 12/18/2014 1540   ALT 24 06/20/2015 1307   ALT 22 12/18/2014 1540   BILITOT 0.85 06/20/2015 1307   BILITOT 1.0 12/18/2014 1540       No results found for: LABCA2  No components found for: LABCA125  No results for input(s): INR in the last 168 hours.  Urinalysis    Component Value Date/Time   COLORURINE YELLOW 12/18/2014 1635   APPEARANCEUR CLEAR 12/18/2014 1635    LABSPEC 1.023 12/18/2014 1635   PHURINE 5.0 12/18/2014 1635   GLUCOSEU NEGATIVE 12/18/2014 1635   HGBUR NEGATIVE 12/18/2014 1635   BILIRUBINUR NEGATIVE 12/18/2014 1635   BILIRUBINUR neg 06/29/2013 1017   KETONESUR NEGATIVE 12/18/2014 1635   PROTEINUR 30* 12/18/2014 1635   PROTEINUR trace 06/29/2013 1017   UROBILINOGEN negative 06/29/2013 1017   NITRITE NEGATIVE 12/18/2014 1635   NITRITE neg 06/29/2013 1017   LEUKOCYTESUR NEGATIVE 12/18/2014 1635    STUDIES: US Thyroid  05/31/2015  CLINICAL DATA:  Goiter by physical examination. EXAM: THYROID ULTRASOUND TECHNIQUE: Ultrasound examination of the thyroid gland and adjacent soft tissues was performed. COMPARISON:  Chest CT 02/02/2015 FINDINGS: Right thyroid lobe Measurements: 5.2 x 2.3 x 2.1 cm. There are numerous right thyroid nodules. Heterogeneous nodule in mid right thyroid lobe measures 1.6 x 1.3 x 1.1 cm. Largest nodule is in the medial mid right thyroid lobe measuring 1.9 x 1.2 x 1.9 cm. This dominant right thyroid nodule is heterogeneous. There are other heterogeneous nodules in the inferior right thyroid lobe measuring up to 1.1 cm and 1.2 cm. There is a round calcified nodule in the right superior thyroid lobe measuring to 0.5 cm. Left thyroid lobe Measurements: 6.3 x 2.4 x 2.6 cm. Multiple left thyroid nodules. Heterogeneous nodule in the superior left thyroid lobe measures 1.2 x 0.9 x 1.2 cm. Dominant nodule in the inferior left thyroid lobe measures 2.8 x 1.7 x 2.3 cm. This dominant nodule is very  heterogeneous. Isthmus Thickness: 0.6 cm.  No nodules visualized. Lymphadenopathy None visualized. IMPRESSION: Multi nodular goiter. The dominant left thyroid nodule measuring up to 2.8 cm and the dominant right thyroid nodule measuring up to 1.9 cm, both meet criteria for ultrasound-guided biopsy. This recommendation follows the consensus statement: Management of Thyroid Nodules Detected at [name]: Society of Radiologists in [place]. Radiology 2005; [id number]. Electronically Signed   By: Markus Daft M.D.   On: 05/31/2015 13:06    ASSESSMENT: 65 y.o. Junior woman status post right breast and right axillary lymph node biopsy 07/19/2014 for a cT2  pN1, stage IIB  invasive ductal carcinoma, grade 2, estrogen and progesterone receptor negative, with an MIB-1 of 90%, and HER-2 amplified  (1) neoadjuvant chemotherapy started 08/08/2014, consisting of carboplatin, docetaxel, trastuzumab and pertuzumab given every 3 weeks 6, completed 11/21/2014  (a) breast MRI after initial 3 cycles shows a significant response  (b) breast MRI after 6 cycles shows a complete radiologic response   (2) trastuzumab to be continued through September 2017 (to end at end of capecitabine therapy)  (a) echo 02/13/2015 shows an EF of 60-65%  (b)  Echo 05/09/2015 shows an ejection fraction of 60-65%  (3) right lumpectomy with sentinel lymph node biopsy on 12/27/14 showed a residual  ypT2 ypN1a, stage IIB invasive ductal carcinoma, grade 2, with repeat estrogen and progesterone receptors again negative  (4) status post right oncoplastic surgery (with left reduction mammoplasty) 01/06/2015 showing in the right breast multiple foci of intralymphatic carcinoma in random sections  (5)  Right modified radical mastectomy 02/15/2014 showed no residual invasive disease; there was DCIS but margins were negative; all 15 axillary lymph nodes were clear  (a)  The patient intends to have eventual reconstruction.  (6)  Postmastectomy radiation completed 05/18/2015 with capecitabine sensitization   (7) adjuvant capecitabine to continue through August 2017   (8) hepatic steatosis with increased fibrosis score (at risk for cirrhosis)   PLAN: Darlene [name] is doing well on full dose capecitabine. She is doing to continue this through August 2017. She will continue trastuzumab every 3 weeks through this September. She is due for a repeat echocardiogram in  July so I have placed the appropriate orders during this visit.   Darlene [name] is to have her thyroid biopsy on 6/7, and will return to see Dr. Jana Hakim in late June. She understands and agrees with this plan. She has been encouraged to call with any issues that might arise before her next visit here.   Laurie Panda, NP   06/20/2015 3:51 PM

## 2015-06-20 NOTE — Patient Instructions (Signed)
[facility] Discharge Instructions for Patients Receiving Chemotherapy  Today you received the following chemotherapy agents Herceptin  To help prevent nausea and vomiting after your treatment, we encourage you to take your nausea medication    If you develop nausea and vomiting that is not controlled by your nausea medication, call the clinic.   BELOW ARE SYMPTOMS THAT SHOULD BE REPORTED IMMEDIATELY:  *FEVER GREATER THAN 100.5 F  *CHILLS WITH OR WITHOUT FEVER  NAUSEA AND VOMITING THAT IS NOT CONTROLLED WITH YOUR NAUSEA MEDICATION  *UNUSUAL SHORTNESS OF BREATH  *UNUSUAL BRUISING OR BLEEDING  TENDERNESS IN MOUTH AND THROAT WITH OR WITHOUT PRESENCE OF ULCERS  *URINARY PROBLEMS  *BOWEL PROBLEMS  UNUSUAL RASH Items with * indicate a potential emergency and should be followed up as soon as possible.  Feel free to call the clinic you have any questions or concerns. The clinic phone number is [phone].  Please show the CHEMO ALERT CARD at check-in to the Emergency Department and triage nurse.   

## 2015-06-20 NOTE — Telephone Encounter (Signed)
Gave patient avs report and appointments for June thru September 2017. Left message on voicemail at heart clinic re calling patient for echo/[facility] for July 2017. Patient aware and also given office number to follow up if she does not hear from them.

## 2015-06-22 ENCOUNTER — Ambulatory Visit
Admission: RE | Admit: 2015-06-22 | Discharge: 2015-06-22 | Disposition: A | Discharge status: Home / Self Care | Payer: BLUE CROSS/BLUE SHIELD | Source: Ambulatory Visit | Attending: Radiation Oncology | Admitting: Radiation Oncology

## 2015-06-22 ENCOUNTER — Encounter: Payer: Self-pay | Admitting: Radiation Oncology

## 2015-06-22 ENCOUNTER — Other Ambulatory Visit: Payer: Self-pay | Admitting: Nurse Practitioner

## 2015-06-22 VITALS — BP 160/61 | HR 62 | Temp 98.1°F | Ht 66.0 in | Wt 221.0 lb

## 2015-06-22 DIAGNOSIS — C50411 Malignant neoplasm of upper-outer quadrant of right female breast: Secondary | ICD-10-CM

## 2015-06-22 NOTE — Progress Notes (Signed)
Radiation Oncology         [phone] ________________________________  Name: Darlene [name] MRN: [id number]  Date: 06/22/2015  DOB: 11/03/1950  Follow-Up Visit Note  CC: Thressa Sheller, MD  Magrinat, Virgie Dad, MD    ICD-9-CM ICD-10-CM   1. Breast cancer of upper-outer quadrant of right female breast ([place]) 174.4 C50.411     Diagnosis: ypT2 ypN1a, stage IIB invasive ductal carcinoma, grade 2 of the right breast  Interval Since Last Radiation: 1 months  04/05/2015-05/19/2015:  1. The right chest wall was treated to 50.4 Gy in 28 fractions at 1.8 Gy per fraction. 2. The right mastectomy scar was treated to 10 Gy in 5 fractions at 2 Gy per fraction.  Narrative:  The patient returns today for routine follow-up. She denies having pain. She reports that her energy level has improved. She is taking xeloda. She saw her endocrinologist, Dr. Chalmers Cater, who palpated goiter on physical examination. An ultrasound of the thyroid on 05/31/15 noted multiple left thyroid nodules.  ALLERGIES:  is allergic to peanuts and tape.  Meds: Current Outpatient Prescriptions  Medication Sig Dispense Refill  . acetaminophen (TYLENOL) 325 MG tablet Take 650 mg by mouth 2 (two) times daily as needed for moderate pain or headache. Reported on 04/17/2015    . calcium citrate (CALCITRATE - DOSED IN MG ELEMENTAL CALCIUM) 950 MG tablet Take 600 mg of elemental calcium by mouth daily.    . capecitabine (XELODA) 500 MG tablet Take 3 tablets by mouth after a meal 2 times a day seven days on, seven days off 84 tablet 4  . diphenhydrAMINE (BENADRYL) 25 MG tablet Take 25 mg by mouth daily as needed for allergies or sleep. Reported on 03/06/2015    . glucose blood (ONETOUCH VERIO) test strip 1 each by Other route as needed for other. Reported on 03/06/2015    . losartan (COZAAR) 100 MG tablet Take 1 tablet by mouth daily. Reported on 03/06/2015    . Melatonin 5 MG TABS Take 5 mg by mouth at bedtime. Reported on 03/06/2015    .  metoCLOPramide (REGLAN) 5 MG tablet Take 1 tablet (5 mg total) by mouth 3 (three) times daily before meals. 90 tablet 4  . Multiple Vitamin (MULTIVITAMIN WITH MINERALS) TABS tablet Take 1 tablet by mouth 2 (two) times daily. Reported on 03/06/2015    . NOVOLIN 70/30 (70-30) 100 UNIT/ML injection Inject 15 Units into the skin 2 (two) times daily with a meal. Reported on 03/06/2015    . Probiotic Product ([employer]S PROBIOTIC PO) Take 1 capsule by mouth daily. Reported on 03/06/2015    . Trastuzumab (HERCEPTIN IV) Inject into the vein. Reported on 03/06/2015    . Vortioxetine HBr (TRINTELLIX) 20 MG TABS Take 20 mg by mouth daily. Reported on 03/06/2015    . Loratadine (CLARITIN) 10 MG CAPS Take 10 mg by mouth daily. Reported on 06/22/2015     No current facility-administered medications for this encounter.    Physical Findings: The patient is in no acute distress. Patient is alert and oriented.  height is 5\' 6"  (1.676 m) and weight is 221 lb (100.245 kg). Her oral temperature is 98.1 F (36.7 C). Her blood pressure is 160/61 and her pulse is 62. Her oxygen saturation is 100%.   Lungs are clear to auscultation bilaterally. Heart has regular rate and rhythm. No palpable cervical, supraclavicular, or axillary adenopathy. The patient's skin has healed well along the right chest wall. Hyperpigmentation changes. No signs of recurrence.  Lab Findings: Lab Results  Component Value Date   WBC 6.1 06/20/2015   HGB 10.4* 06/20/2015   HCT 30.4* 06/20/2015   MCV 95.0 06/20/2015   PLT 186 06/20/2015    Radiographic Findings: US Thyroid  05/31/2015  CLINICAL DATA:  Goiter by physical examination. EXAM: THYROID ULTRASOUND TECHNIQUE: Ultrasound examination of the thyroid gland and adjacent soft tissues was performed. COMPARISON:  Chest CT 02/02/2015 FINDINGS: Right thyroid lobe Measurements: 5.2 x 2.3 x 2.1 cm. There are numerous right thyroid nodules. Heterogeneous nodule in mid right thyroid lobe measures 1.6 x 1.3 x  1.1 cm. Largest nodule is in the medial mid right thyroid lobe measuring 1.9 x 1.2 x 1.9 cm. This dominant right thyroid nodule is heterogeneous. There are other heterogeneous nodules in the inferior right thyroid lobe measuring up to 1.1 cm and 1.2 cm. There is a round calcified nodule in the right superior thyroid lobe measuring to 0.5 cm. Left thyroid lobe Measurements: 6.3 x 2.4 x 2.6 cm. Multiple left thyroid nodules. Heterogeneous nodule in the superior left thyroid lobe measures 1.2 x 0.9 x 1.2 cm. Dominant nodule in the inferior left thyroid lobe measures 2.8 x 1.7 x 2.3 cm. This dominant nodule is very heterogeneous. Isthmus Thickness: 0.6 cm.  No nodules visualized. Lymphadenopathy None visualized. IMPRESSION: Multi nodular goiter. The dominant left thyroid nodule measuring up to 2.8 cm and the dominant right thyroid nodule measuring up to 1.9 cm, both meet criteria for ultrasound-guided biopsy. This recommendation follows the consensus statement: Management of Thyroid Nodules Detected at [name]: Society of Radiologists in [place]. Radiology 2005; [id number]. Electronically Signed   By: Markus Daft M.D.   On: 05/31/2015 13:06    Impression:  The patient is recovering from the effects of radiation.  Plan: The patient has a biopsy of her thyroid nodules on 06/28/15. She is scheduled to see Dr. Jana Hakim on 07/10/15. She will follow up with radiation oncology in 6 months.  ____________________________________ -----------------------------------  Blair Promise, PhD, MD  This document serves as a record of services personally performed by Gery Pray, MD. It was created on his behalf by Darcus Austin, a trained medical scribe. The creation of this record is based on the scribe's personal observations and the provider's statements to them. This document has been checked and approved by the attending provider.

## 2015-06-22 NOTE — Progress Notes (Signed)
Darlene [name] here for follow up.  She denies having pain.  She reports that her energy level has improved.  She is taking xeloda.  The skin on her right chest wall is pink with hyperpigmentation.    BP 160/61 mmHg  Pulse 62  Temp(Src) 98.1 F (36.7 C) (Oral)  Ht 5\' 6"  (1.676 m)  Wt 221 lb (100.245 kg)  BMI 35.69 kg/m2  SpO2 100%  LMP 10/22/2007   Wt Readings from Last 3 Encounters:  06/22/15 221 lb (100.245 kg)  06/20/15 219 lb 11.2 oz (99.655 kg)  05/29/15 210 lb 3.2 oz (95.346 kg)

## 2015-06-23 NOTE — Addendum Note (Signed)
Encounter addended by: Jacqulyn Liner, RN on: 06/23/2015  1:21 PM<BR>     Documentation filed: Charges VN

## 2015-06-26 ENCOUNTER — Other Ambulatory Visit (HOSPITAL_BASED_OUTPATIENT_CLINIC_OR_DEPARTMENT_OTHER): Payer: BLUE CROSS/BLUE SHIELD

## 2015-06-26 DIAGNOSIS — C50411 Malignant neoplasm of upper-outer quadrant of right female breast: Secondary | ICD-10-CM

## 2015-06-26 LAB — COMPREHENSIVE METABOLIC PANEL
ALT: 31 U/L (ref 0–55)
AST: 30 U/L (ref 5–34)
Albumin: 3.6 g/dL (ref 3.5–5.0)
Alkaline Phosphatase: 74 U/L (ref 40–150)
Anion Gap: 6 mEq/L (ref 3–11)
BUN: 32.7 mg/dL — ABNORMAL HIGH (ref 7.0–26.0)
CO2: 28 mEq/L (ref 22–29)
Calcium: 9.2 mg/dL (ref 8.4–10.4)
Chloride: 107 mEq/L (ref 98–109)
Creatinine: 1.3 mg/dL — ABNORMAL HIGH (ref 0.6–1.1)
EGFR: 43 mL/min/{1.73_m2} — ABNORMAL LOW (ref >90)
Glucose: 119 mg/dl (ref 70–140)
Potassium: 4.5 mEq/L (ref 3.5–5.1)
Sodium: 141 mEq/L (ref 136–145)
Total Bilirubin: 0.78 mg/dL (ref 0.20–1.20)
Total Protein: 6.5 g/dL (ref 6.4–8.3)

## 2015-06-26 LAB — CBC WITH DIFFERENTIAL/PLATELET
BASO%: 0.3 % (ref 0.0–2.0)
Basophils Absolute: 0 10*3/uL (ref 0.0–0.1)
EOS%: 1.8 % (ref 0.0–7.0)
Eosinophils Absolute: 0.1 10*3/uL (ref 0.0–0.5)
HCT: 31.7 % — ABNORMAL LOW (ref 34.8–46.6)
HGB: 10.7 g/dL — ABNORMAL LOW (ref 11.6–15.9)
LYMPH%: 21.8 % (ref 14.0–49.7)
MCH: 32.6 pg (ref 25.1–34.0)
MCHC: 33.7 g/dL (ref 31.5–36.0)
MCV: 96.5 fL (ref 79.5–101.0)
MONO#: 0.6 10*3/uL (ref 0.1–0.9)
MONO%: 8.9 % (ref 0.0–14.0)
NEUT#: 4.2 10*3/uL (ref 1.5–6.5)
NEUT%: 67.2 % (ref 38.4–76.8)
Platelets: 208 10*3/uL (ref 145–400)
RBC: 3.29 10*6/uL — ABNORMAL LOW (ref 3.70–5.45)
RDW: 18.7 % — ABNORMAL HIGH (ref 11.2–14.5)
WBC: 6.2 10*3/uL (ref 3.9–10.3)
lymph#: 1.4 10*3/uL (ref 0.9–3.3)

## 2015-06-27 ENCOUNTER — Ambulatory Visit (HOSPITAL_COMMUNITY): Payer: BLUE CROSS/BLUE SHIELD | Attending: Cardiology

## 2015-06-27 ENCOUNTER — Other Ambulatory Visit: Payer: Self-pay

## 2015-06-27 DIAGNOSIS — Z09 Encounter for follow-up examination after completed treatment for conditions other than malignant neoplasm: Secondary | ICD-10-CM | POA: Diagnosis present

## 2015-06-27 DIAGNOSIS — I340 Nonrheumatic mitral (valve) insufficiency: Secondary | ICD-10-CM | POA: Diagnosis not present

## 2015-06-27 DIAGNOSIS — I071 Rheumatic tricuspid insufficiency: Secondary | ICD-10-CM | POA: Diagnosis not present

## 2015-06-27 DIAGNOSIS — I517 Cardiomegaly: Secondary | ICD-10-CM | POA: Insufficient documentation

## 2015-06-27 DIAGNOSIS — C50411 Malignant neoplasm of upper-outer quadrant of right female breast: Secondary | ICD-10-CM | POA: Insufficient documentation

## 2015-06-27 DIAGNOSIS — I358 Other nonrheumatic aortic valve disorders: Secondary | ICD-10-CM | POA: Insufficient documentation

## 2015-06-28 ENCOUNTER — Other Ambulatory Visit (HOSPITAL_COMMUNITY)
Admission: RE | Admit: 2015-06-28 | Discharge: 2015-06-28 | Disposition: A | Discharge status: Home / Self Care | Payer: BLUE CROSS/BLUE SHIELD | Source: Ambulatory Visit | Attending: Radiology | Admitting: Radiology

## 2015-06-28 ENCOUNTER — Ambulatory Visit
Admission: RE | Admit: 2015-06-28 | Discharge: 2015-06-28 | Disposition: A | Discharge status: Home / Self Care | Payer: BLUE CROSS/BLUE SHIELD | Source: Ambulatory Visit | Attending: Endocrinology | Admitting: Endocrinology

## 2015-06-28 DIAGNOSIS — E049 Nontoxic goiter, unspecified: Secondary | ICD-10-CM

## 2015-06-29 ENCOUNTER — Telehealth (HOSPITAL_COMMUNITY): Payer: Self-pay | Admitting: Vascular Surgery

## 2015-06-29 NOTE — Telephone Encounter (Signed)
Left pt message to make f/u appt w/ echo 

## 2015-07-05 DIAGNOSIS — Z9011 Acquired absence of right breast and nipple: Secondary | ICD-10-CM | POA: Insufficient documentation

## 2015-07-05 DIAGNOSIS — Z923 Personal history of irradiation: Secondary | ICD-10-CM | POA: Insufficient documentation

## 2015-07-07 ENCOUNTER — Ambulatory Visit (INDEPENDENT_AMBULATORY_CARE_PROVIDER_SITE_OTHER): Payer: BLUE CROSS/BLUE SHIELD | Admitting: Nurse Practitioner

## 2015-07-07 ENCOUNTER — Encounter: Payer: Self-pay | Admitting: Nurse Practitioner

## 2015-07-07 VITALS — BP 140/60 | HR 68 | Resp 16 | Ht 68.5 in | Wt 216.0 lb

## 2015-07-07 DIAGNOSIS — Z0000 Encounter for general adult medical examination without abnormal findings: Secondary | ICD-10-CM | POA: Diagnosis not present

## 2015-07-07 DIAGNOSIS — Z01419 Encounter for gynecological examination (general) (routine) without abnormal findings: Secondary | ICD-10-CM

## 2015-07-07 NOTE — Progress Notes (Signed)
65 y.o. [id number] Married  Caucasian Fe here for annual exam.  New diagnosis of right breast cancer with initial chemotherapy.  Then surgery 12/27/14 lumpectomy on the right .  12/16 had reduction on the left and reconstruction on the right finding of more caner on the right.  She then had mastectomy 02/15/15.  Has finished radiation on 05/16/15.  Now on chemo every other week by mouth and Herceptin IV every 3 weeks via port a cath.  Then went for thyroid US and had thyroid biopsy for nodule X 2 which was benign.  Patient's last menstrual period was 10/22/2007.          Sexually active: Yes.    The current method of family planning is post menopausal status.    Exercising: Yes.    Gym/ health club routine includes bicycling . Smoker:  no  Health Maintenance: Pap:  07/05/14 Neg. HR HPV:neg  MMG:  MRI Bilateral Breast BIRADS6: Biopsy-Proven malignancy  Colonoscopy:  11/24/13 Diverticulosis - f/u 5 years  BMD:  07/05/13, T Score 2.7 S/0.4 R/0.6 L   TDaP:  Unsure Shingles: 2015  Pneumonia: ~2015  Hep C and HIV: No Labs: PCP   reports that she has never smoked. She has never used smokeless tobacco. She reports that she drinks alcohol. She reports that she does not use illicit drugs.  Past Medical History  Diagnosis Date  . Obesity   . Hypertension   . Hyperlipidemia   . Diabetes mellitus without complication ([place])     [id number] years  . Eating disorder     binge eating  . Balance problem   . Arthritis     in fingers, shoulders  . Anemia     hx of  . Depression   . Ovarian cyst rupture     possible  . Clumsiness   . Breast cancer of upper-outer quadrant of right female breast ([place]) 07/22/2014  . Breast cancer ([place])   . Anxiety   . Pneumonia   . Neuromuscular disorder ([place])     diabetic neuropathy  . Obstructive sleep apnea on CPAP     uses cpap nightly  . Heart murmur   . Radiation 04/05/15-05/19/15    right chest wall 50.4 Gy, mastectomy scar 10 Gy  . Thyroid nodule 06/2014  . Pneumonia  11/2014    Past Surgical History  Procedure Laterality Date  . Carpal tunnel release Right   . Carpal tunnel release Left   . Breath tek h pylori N/A 09/15/2012    Procedure: BREATH TEK H PYLORI;  Surgeon: Pedro Earls, MD;  Location: [facility];  Service: General;  Laterality: N/A;  . Dupuytren contracture release    . Trigger finger release      x3  . Toe surgery  1970s    bone spur  . Rotator cuff repair Right 2005  . Gastric roux-en-y N/A 11/02/2012    Procedure: LAPAROSCOPIC ROUX-EN-Y GASTRIC;  Surgeon: Pedro Earls, MD;  Location: [facility];  Service: General;  Laterality: N/A;  . Bunionectomy Left 12/2013  . Portacath placement Left 08/02/2014    Procedure: INSERTION PORT-A-CATH;  Surgeon: Stark Klein, MD;  Location: [facility];  Service: General;  Laterality: Left;  . Breast lumpectomy with radioactive seed and sentinel lymph node biopsy Right 12/27/2014    Procedure: BREAST LUMPECTOMY WITH RADIOACTIVE SEED AND SENTINEL LYMPH NODE BIOPSY;  Surgeon: Stark Klein, MD;  Location: [facility];  Service: General;  Laterality: Right;  .  Breast reduction surgery Bilateral 01/06/2015    Procedure: BILATERAL BREAST REDUCTION, RIGHT ONCOPLASTIC RECONSTRUCTION),LEFT BREAST REDUCTION FOR SYMMETRY;  Surgeon: [name], MD;  Location: [facility];  Service: Plastics;  Laterality: Bilateral;  . Mastectomy Right 02/15/2015    modified  . Modified mastectomy Right 02/15/2015    Procedure: RIGHT MODIFIED RADICAL MASTECTOMY;  Surgeon: Stark Klein, MD;  Location: [facility];  Service: General;  Laterality: Right;  . Biopsy thyroid Bilateral 06/2014    2 nodules - Benign     Current Outpatient Prescriptions  Medication Sig Dispense Refill  . acetaminophen (TYLENOL) 325 MG tablet Take 650 mg by mouth 2 (two) times daily as needed for moderate pain or headache. Reported on 04/17/2015    . calcium citrate (CALCITRATE - DOSED IN MG ELEMENTAL CALCIUM) 950 MG tablet Take  600 mg of elemental calcium by mouth daily.    . capecitabine (XELODA) 500 MG tablet Take 3 tablets by mouth after a meal 2 times a day seven days on, seven days off 84 tablet 4  . diphenhydrAMINE (BENADRYL) 25 MG tablet Take 25 mg by mouth daily as needed for allergies or sleep. Reported on 03/06/2015    . glucose blood (ONETOUCH VERIO) test strip 1 each by Other route as needed for other. Reported on 03/06/2015    . losartan (COZAAR) 100 MG tablet Take 1 tablet by mouth daily. Reported on 03/06/2015    . Melatonin 5 MG TABS Take 5 mg by mouth at bedtime. Reported on 03/06/2015    . metoCLOPramide (REGLAN) 5 MG tablet Take 1 tablet (5 mg total) by mouth 3 (three) times daily before meals. 90 tablet 4  . mometasone (NASONEX) 50 MCG/ACT nasal spray     . Multiple Vitamin (MULTIVITAMIN WITH MINERALS) TABS tablet Take 1 tablet by mouth 2 (two) times daily. Reported on 03/06/2015    . NOVOLIN 70/30 (70-30) 100 UNIT/ML injection Inject 15 Units into the skin 2 (two) times daily with a meal. Reported on 03/06/2015    . Probiotic Product ([employer] PROBIOTIC PO) Take 1 capsule by mouth daily. Reported on 03/06/2015    . Trastuzumab (HERCEPTIN IV) Inject into the vein. Reported on 03/06/2015    . Vortioxetine HBr (TRINTELLIX) 20 MG TABS Take 20 mg by mouth daily. Reported on 03/06/2015     No current facility-administered medications for this visit.    Family History  Problem Relation Age of Onset  . CVA Mother   . Diabetes Father   . Heart failure Father   . Hypertension Sister   . Diabetes Brother   . Diabetes Paternal Uncle   . Breast cancer Paternal Grandmother     ROS:  Pertinent items are noted in HPI.  Otherwise, a comprehensive ROS was negative.  Exam:   BP 140/60 mmHg  Pulse 68  Resp 16  Ht 5' 8.5" (1.74 m)  Wt 216 lb (97.977 kg)  BMI 32.36 kg/m2  LMP 10/22/2007 Height: 5' 8.5" (174 cm) Ht Readings from Last 3 Encounters:  07/07/15 5' 8.5" (1.74 m)  06/22/15 5\' 6"  (1.676 m)  06/20/15 5\' 9"   (1.753 m)    General appearance: alert, cooperative and appears stated age Head: Normocephalic, without obvious abnormality, atraumatic Neck: no adenopathy, supple, symmetrical, trachea midline and thyroid normal to inspection and palpation Lungs: clear to auscultation bilaterally Breasts: surgical changes with reduction on the left.  S/P mastectomy with extensive scars and radiation changes on the right. Heart: regular rate and rhythm Abdomen: soft, non-tender; no  masses,  no organomegaly Extremities: extremities normal, atraumatic, no cyanosis or edema Skin: Skin color, texture, turgor normal. No rashes or lesions Lymph nodes: Cervical, supraclavicular, and axillary nodes normal. No abnormal inguinal nodes palpated Neurologic: Grossly normal   Pelvic: External genitalia:  no lesions              Urethra:  normal appearing urethra with no masses, tenderness or lesions              Bartholin's and Skene's: normal                 Vagina: normal appearing vagina with normal color and discharge, no lesions              Cervix: anteverted              Pap taken: No. Bimanual Exam:  Uterus:  normal size, contour, position, consistency, mobility, non-tender              Adnexa: no mass, fullness, tenderness               Rectovaginal: Confirms               Anus:  normal sphincter tone, no lesions  Chaperone present: no  A:  Well Woman with normal exam  Postmenopausal - no HRT Gastric Roux-en-Y bypass 10/2012 Endo biopsy with proliferative endo 01/2008 - took provera for awhile off for about 2 years. HTN, IDDM  S/P Right breast cancer with Lumpectomy, then reduction on the left.  again biopsy on the right with cancer during reconstruction  S/P right Mastectomy 02/15/15 treatment with chemo and radiation - considering tram flap.  P:   Reviewed health and wellness pertinent to exam  Pap smear as above  Mammogram on the left is due but will discuss  with Oncology  Encouragement is given  Counseled on breast self exam, mammography screening, adequate intake of calcium and vitamin D, diet and exercise return annually or prn  An After Visit Summary was printed and given to the patient.

## 2015-07-07 NOTE — Patient Instructions (Addendum)

## 2015-07-10 ENCOUNTER — Ambulatory Visit (HOSPITAL_BASED_OUTPATIENT_CLINIC_OR_DEPARTMENT_OTHER): Payer: BLUE CROSS/BLUE SHIELD | Admitting: Oncology

## 2015-07-10 ENCOUNTER — Ambulatory Visit (HOSPITAL_BASED_OUTPATIENT_CLINIC_OR_DEPARTMENT_OTHER): Payer: BLUE CROSS/BLUE SHIELD

## 2015-07-10 ENCOUNTER — Ambulatory Visit: Payer: BLUE CROSS/BLUE SHIELD

## 2015-07-10 ENCOUNTER — Other Ambulatory Visit (HOSPITAL_BASED_OUTPATIENT_CLINIC_OR_DEPARTMENT_OTHER): Payer: BLUE CROSS/BLUE SHIELD

## 2015-07-10 DIAGNOSIS — C50411 Malignant neoplasm of upper-outer quadrant of right female breast: Secondary | ICD-10-CM

## 2015-07-10 DIAGNOSIS — Z794 Long term (current) use of insulin: Secondary | ICD-10-CM

## 2015-07-10 DIAGNOSIS — E119 Type 2 diabetes mellitus without complications: Secondary | ICD-10-CM

## 2015-07-10 DIAGNOSIS — Z171 Estrogen receptor negative status [ER-]: Secondary | ICD-10-CM | POA: Diagnosis not present

## 2015-07-10 DIAGNOSIS — Z5112 Encounter for antineoplastic immunotherapy: Secondary | ICD-10-CM | POA: Diagnosis not present

## 2015-07-10 LAB — COMPREHENSIVE METABOLIC PANEL
ALT: 28 U/L (ref 0–55)
AST: 20 U/L (ref 5–34)
Albumin: 3.3 g/dL — ABNORMAL LOW (ref 3.5–5.0)
Alkaline Phosphatase: 78 U/L (ref 40–150)
Anion Gap: 8 mEq/L (ref 3–11)
BUN: 23.8 mg/dL (ref 7.0–26.0)
CO2: 23 mEq/L (ref 22–29)
Calcium: 8.8 mg/dL (ref 8.4–10.4)
Chloride: 111 mEq/L — ABNORMAL HIGH (ref 98–109)
Creatinine: 1.2 mg/dL — ABNORMAL HIGH (ref 0.6–1.1)
EGFR: 51 mL/min/{1.73_m2} — ABNORMAL LOW (ref >90)
Glucose: 176 mg/dl — ABNORMAL HIGH (ref 70–140)
Potassium: 4 mEq/L (ref 3.5–5.1)
Sodium: 142 mEq/L (ref 136–145)
Total Bilirubin: 0.77 mg/dL (ref 0.20–1.20)
Total Protein: 6.2 g/dL — ABNORMAL LOW (ref 6.4–8.3)

## 2015-07-10 LAB — CBC WITH DIFFERENTIAL/PLATELET
BASO%: 0.2 % (ref 0.0–2.0)
Basophils Absolute: 0 10*3/uL (ref 0.0–0.1)
EOS%: 3.5 % (ref 0.0–7.0)
Eosinophils Absolute: 0.2 10*3/uL (ref 0.0–0.5)
HCT: 29.1 % — ABNORMAL LOW (ref 34.8–46.6)
HGB: 10 g/dL — ABNORMAL LOW (ref 11.6–15.9)
LYMPH%: 20.6 % (ref 14.0–49.7)
MCH: 33.3 pg (ref 25.1–34.0)
MCHC: 34.4 g/dL (ref 31.5–36.0)
MCV: 97 fL (ref 79.5–101.0)
MONO#: 0.6 10*3/uL (ref 0.1–0.9)
MONO%: 10.8 % (ref 0.0–14.0)
NEUT#: 3.4 10*3/uL (ref 1.5–6.5)
NEUT%: 64.9 % (ref 38.4–76.8)
Platelets: 174 10*3/uL (ref 145–400)
RBC: 3 10*6/uL — ABNORMAL LOW (ref 3.70–5.45)
RDW: 15.9 % — ABNORMAL HIGH (ref 11.2–14.5)
WBC: 5.2 10*3/uL (ref 3.9–10.3)
lymph#: 1.1 10*3/uL (ref 0.9–3.3)

## 2015-07-10 MED ORDER — DIPHENHYDRAMINE HCL 25 MG PO CAPS
ORAL_CAPSULE | ORAL | Status: AC
  Filled 2015-07-10: qty 1

## 2015-07-10 MED ORDER — SODIUM CHLORIDE 0.9 % IJ SOLN
10.0000 mL | INTRAMUSCULAR | Status: DC | PRN
  Administered 2015-07-10: 10 mL
  Filled 2015-07-10: qty 10

## 2015-07-10 MED ORDER — SODIUM CHLORIDE 0.9 % IV SOLN
Freq: Once | INTRAVENOUS | Status: AC
  Administered 2015-07-10: 10:00:00 via INTRAVENOUS

## 2015-07-10 MED ORDER — DIPHENHYDRAMINE HCL 25 MG PO CAPS
25.0000 mg | ORAL_CAPSULE | Freq: Once | ORAL | Status: AC
  Administered 2015-07-10: 25 mg via ORAL

## 2015-07-10 MED ORDER — ACETAMINOPHEN 325 MG PO TABS
ORAL_TABLET | ORAL | Status: AC
  Filled 2015-07-10: qty 1

## 2015-07-10 MED ORDER — ACETAMINOPHEN 325 MG PO TABS
ORAL_TABLET | ORAL | Status: AC
Start: 2015-07-10 — End: 2015-07-10
  Filled 2015-07-10: qty 2

## 2015-07-10 MED ORDER — HEPARIN SOD (PORK) LOCK FLUSH 100 UNIT/ML IV SOLN
500.0000 [IU] | Freq: Once | INTRAVENOUS | Status: AC | PRN
  Administered 2015-07-10: 500 [IU]
  Filled 2015-07-10: qty 5

## 2015-07-10 MED ORDER — TRASTUZUMAB CHEMO INJECTION 440 MG
6.0000 mg/kg | Freq: Once | INTRAVENOUS | Status: AC
  Administered 2015-07-10: 567 mg via INTRAVENOUS
  Filled 2015-07-10: qty 27

## 2015-07-10 MED ORDER — ACETAMINOPHEN 325 MG PO TABS
650.0000 mg | ORAL_TABLET | Freq: Once | ORAL | Status: AC
  Administered 2015-07-10: 650 mg via ORAL

## 2015-07-10 NOTE — Patient Instructions (Signed)
Cancer Center Discharge Instructions for Patients Receiving Chemotherapy  Today you received the following chemotherapy agents Herceptin  To help prevent nausea and vomiting after your treatment, we encourage you to take your nausea medication    If you develop nausea and vomiting that is not controlled by your nausea medication, call the clinic.   BELOW ARE SYMPTOMS THAT SHOULD BE REPORTED IMMEDIATELY:  *FEVER GREATER THAN 100.5 F  *CHILLS WITH OR WITHOUT FEVER  NAUSEA AND VOMITING THAT IS NOT CONTROLLED WITH YOUR NAUSEA MEDICATION  *UNUSUAL SHORTNESS OF BREATH  *UNUSUAL BRUISING OR BLEEDING  TENDERNESS IN MOUTH AND THROAT WITH OR WITHOUT PRESENCE OF ULCERS  *URINARY PROBLEMS  *BOWEL PROBLEMS  UNUSUAL RASH Items with * indicate a potential emergency and should be followed up as soon as possible.  Feel free to call the clinic you have any questions or concerns. The clinic phone number is [phone].  Please show the CHEMO ALERT CARD at check-in to the Emergency Department and triage nurse.   

## 2015-07-10 NOTE — Progress Notes (Signed)
Encounter reviewed by Dr. Brook Amundson C. Silva.  

## 2015-07-10 NOTE — Progress Notes (Signed)
[place]  [phone] Fax:([phone]) [phone]   ID: [name] DOB: July 27, 1950  MR#: [id number]  QTM#:[id number]  Patient Care Team: Thressa Sheller, MD as PCP - General (Internal Medicine) Bryson Ha Himmelrich, RD as Dietitian (Bariatrics) Stark Klein, MD as Consulting Physician (General Surgery) Chauncey Cruel, MD as Consulting Physician (Oncology) Arloa Koh, MD as Consulting Physician (Radiation Oncology) Mauro Kaufmann, RN as Registered Nurse Rockwell Germany, RN as Registered Nurse Jacelyn Pi, MD as Consulting Physician (Endocrinology) Kem Boroughs, FNP as Nurse Practitioner (Family Medicine) [name], MD as Consulting Physician (Plastic Surgery) Larey Dresser, MD as Consulting Physician (Cardiology) PCP: Thressa Sheller, MD OTHER MD:  CHIEF COMPLAINT:  HER-2 positive, estrogen receptor negative breast cancer  CURRENT TREATMENT:  anti-HER-2 treatment; adjuvant radiation and capecitabine pending  BREAST CANCER HISTORY: From the original intake note:  "Darlene [name]" had screening mammography at her gynecologist suggestive of a possible abnormality in the right breast. However right diagnostic mammogram 04/12/2013 at [facility] was negative. More recently however, she noted a lump in her right breast and brought it to her gynecologist's attention. On 07/18/2014 the patient had bilateral diagnostic mammography with tomosynthesis at [facility]. The breast density was category B. In the right breast upper outer quadrant there was an area of focal asymmetry with indistinct margins.[name] Ultrasound was obtained and showed in addition to the mass in question, measuring 1.7 cm, an abnormal-appearing lymph node in the right axillary tail.  On 07/19/2014 the patient underwent biopsy of the right breast massin question as well as a right axillary lymph node. Both were positive for invasive ductal carcinoma, grade 2, estrogen and progesterone receptor negative, with  an MIB-1 of 90%, and HER-2 pending. Incidentally the lymph node biopsy showed a lymphocytic inflammatory component but no lymph node architecture.  Her subsequent history is as detailed below.  INTERVAL HISTORY:  Darlene [name] returns today for follow-up of her breast cancer, accompanied by her husband [name]. She continues on trastuzumab, which she receives today and will receive every 21 days through September. She is also on capecitabine, 1500 mg twice daily 1 week on and one-week off. Today is day 14 cycle 2. She will start her third cycle 07/12/2015.  Since her last visit here she had to thyroid biopsies, specifically the left lobe inferiorly, and both came back negative for malignancy ([id number])  REVIEW OF SYSTEMS: Darlene [name] is tolerating the capecitabine with no side effects that she is aware of, and in particular she has no mouth sores, no diarrhea, and no palmar plantar erythrodysesthesia symptoms. She also tolerates the trastuzumab with no side effects that she is aware of. Her port is working well. She does feel anxious at times, but that is getting better. She tells me her blood sugars are "okay", although her A1c was 7.5. She was told by her gynecologist that she needs hepatitis C and HIV testing area she is reluctantly agreeing with that and wishes [name] to draw those for her. She is considering reconstruction, but at this point she is actually leaning against it. She may want just a little "touchup" of her scar and removal of the "dog ear" in her right axilla. She is going to the gym about twice a week. A detailed review of systems today was otherwise noncontributory  PAST MEDICAL HISTORY: Past Medical History  Diagnosis Date  . Obesity   . Hypertension   . Hyperlipidemia   . Diabetes mellitus without complication ([place])     34+ years  . Eating  disorder     binge eating  . Balance problem   . Arthritis     in fingers, shoulders  . Anemia     hx of  . Depression   . Ovarian cyst rupture      possible  . Clumsiness   . Breast cancer of upper-outer quadrant of right female breast ([place]) 07/22/2014  . Breast cancer ([place])   . Anxiety   . Pneumonia   . Neuromuscular disorder ([place])     diabetic neuropathy  . Obstructive sleep apnea on CPAP     uses cpap nightly  . Heart murmur   . Radiation 04/05/15-05/19/15    right chest wall 50.4 Gy, mastectomy scar 10 Gy  . Thyroid nodule 06/2014  . Pneumonia 11/2014    PAST SURGICAL HISTORY: Past Surgical History  Procedure Laterality Date  . Carpal tunnel release Right   . Carpal tunnel release Left   . Breath tek h pylori N/A 09/15/2012    Procedure: BREATH TEK H PYLORI;  Surgeon: Pedro Earls, MD;  Location: [facility];  Service: General;  Laterality: N/A;  . Dupuytren contracture release    . Trigger finger release      x3  . Toe surgery  1970s    bone spur  . Rotator cuff repair Right 2005  . Gastric roux-en-y N/A 11/02/2012    Procedure: LAPAROSCOPIC ROUX-EN-Y GASTRIC;  Surgeon: Pedro Earls, MD;  Location: [facility];  Service: General;  Laterality: N/A;  . Bunionectomy Left 12/2013  . Portacath placement Left 08/02/2014    Procedure: INSERTION PORT-A-CATH;  Surgeon: Stark Klein, MD;  Location: [facility];  Service: General;  Laterality: Left;  . Breast lumpectomy with radioactive seed and sentinel lymph node biopsy Right 12/27/2014    Procedure: BREAST LUMPECTOMY WITH RADIOACTIVE SEED AND SENTINEL LYMPH NODE BIOPSY;  Surgeon: Stark Klein, MD;  Location: [facility];  Service: General;  Laterality: Right;  . Breast reduction surgery Bilateral 01/06/2015    Procedure: BILATERAL BREAST REDUCTION, RIGHT ONCOPLASTIC RECONSTRUCTION),LEFT BREAST REDUCTION FOR SYMMETRY;  Surgeon: [name], MD;  Location: [facility];  Service: Plastics;  Laterality: Bilateral;  . Mastectomy Right 02/15/2015    modified  . Modified mastectomy Right 02/15/2015    Procedure: RIGHT MODIFIED RADICAL MASTECTOMY;  Surgeon:  Stark Klein, MD;  Location: [place];  Service: General;  Laterality: Right;  . Biopsy thyroid Bilateral 06/2014    2 nodules - Benign     FAMILY HISTORY Family History  Problem Relation Age of Onset  . CVA Mother   . Diabetes Father   . Heart failure Father   . Hypertension Sister   . Diabetes Brother   . Diabetes Paternal Uncle   . Breast cancer Paternal Grandmother    the patient's father died from complications of diabetes at age 10. The patient's mother died at age 28 with a subarachnoid hemorrhage. The patient had one brother, one sister. The only breast cancer was the patient's paternal grandmother diagnosed in her 14s. There is no history of ovarian cancer in the family.  GYNECOLOGIC HISTORY:  Patient's last menstrual period was 10/22/2007.  menarche age 44, the patient is GX P0. She went through the change of life in 2011. She did not take hormone replacement.  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 Her husband [name] is a retired Visual merchandiser (he taught at [employer]).  [name] has  2 children from an earlier marriage, Rockey Situ who teaches in [place], and Vonnetta Akey,  who works at the [employer] in in [place]. He has 1 grand son, aged 44. The patient and her husband are not church attender's    ADVANCED DIRECTIVES:  Not in place   HEALTH MAINTENANCE: Social History  Substance Use Topics  . Smoking status: Never Smoker   . Smokeless tobacco: Never Used  . Alcohol Use: 0.0 oz/week    0 Standard drinks or equivalent per week     Comment: social     Colonoscopy:  May 2016  PAP:  Bone density: 07/05/2013 normal, with a T score of 0.4  Lipid panel:  Allergies  Allergen Reactions  . Peanuts [Peanut Oil] Itching    "Large amounts"  . Tape Hives and Rash    Current Outpatient Prescriptions  Medication Sig Dispense Refill  . acetaminophen (TYLENOL) 325 MG tablet Take 650 mg by  mouth 2 (two) times daily as needed for moderate pain or headache. Reported on 04/17/2015    . calcium citrate (CALCITRATE - DOSED IN MG ELEMENTAL CALCIUM) 950 MG tablet Take 600 mg of elemental calcium by mouth daily.    . capecitabine (XELODA) 500 MG tablet Take 3 tablets by mouth after a meal 2 times a day seven days on, seven days off 84 tablet 4  . diphenhydrAMINE (BENADRYL) 25 MG tablet Take 25 mg by mouth daily as needed for allergies or sleep. Reported on 03/06/2015    . glucose blood (ONETOUCH VERIO) test strip 1 each by Other route as needed for other. Reported on 03/06/2015    . losartan (COZAAR) 100 MG tablet Take 1 tablet by mouth daily. Reported on 03/06/2015    . Melatonin 5 MG TABS Take 5 mg by mouth at bedtime. Reported on 03/06/2015    . metoCLOPramide (REGLAN) 5 MG tablet Take 1 tablet (5 mg total) by mouth 3 (three) times daily before meals. 90 tablet 4  . mometasone (NASONEX) 50 MCG/ACT nasal spray     . Multiple Vitamin (MULTIVITAMIN WITH MINERALS) TABS tablet Take 1 tablet by mouth 2 (two) times daily. Reported on 03/06/2015    . NOVOLIN 70/30 (70-30) 100 UNIT/ML injection Inject 15 Units into the skin 2 (two) times daily with a meal. Reported on 03/06/2015    . Probiotic Product ([employer] PROBIOTIC PO) Take 1 capsule by mouth daily. Reported on 03/06/2015    . Trastuzumab (HERCEPTIN IV) Inject into the vein. Reported on 03/06/2015    . Vortioxetine HBr (TRINTELLIX) 20 MG TABS Take 20 mg by mouth daily. Reported on 03/06/2015     No current facility-administered medications for this visit.   Facility-Administered Medications Ordered in Other Visits  Medication Dose Route Frequency Provider Last Rate Last Dose  . sodium chloride 0.9 % injection 10 mL  10 mL Intracatheter PRN Laurie Panda, NP   10 mL at 07/10/15 0829    OBJECTIVE:  Middle-aged white woman In no acute distress  Filed Vitals:   07/10/15 0854  BP: 159/64  Pulse: 57  Temp: 97.8 F (36.6 C)  Resp: 18     Body mass  index is 32.33 kg/(m^2).    ECOG FS:1 - Symptomatic but completely ambulatory  Hair has come in strong and curly, salt-and-pepper. Sclerae unicteric, pupils round and equal Oropharynx clear and moist-- no thrush or other lesions No cervical or supraclavicular adenopathy Lungs no rales or rhonchi Heart regular rate and rhythm Abd soft, nontender,  positive bowel sounds MSK no focal spinal tenderness, no upper extremity lymphedema Neuro: nonfocal, well oriented, appropriate affect Breasts: Deferred; right prosthesis in place   LAB RESULTS:  CMP     Component Value Date/Time   NA 142 07/10/2015 0814   NA 137 02/16/2015 0418   K 4.0 07/10/2015 0814   K 4.3 02/16/2015 0418   CL 107 02/16/2015 0418   CO2 23 07/10/2015 0814   CO2 23 02/16/2015 0418   GLUCOSE 176* 07/10/2015 0814   GLUCOSE 218* 02/16/2015 0418   BUN 23.8 07/10/2015 0814   BUN 18 02/16/2015 0418   CREATININE 1.2* 07/10/2015 0814   CREATININE 1.20* 02/16/2015 0418   CREATININE 1.10 02/18/2013 0827   CALCIUM 8.8 07/10/2015 0814   CALCIUM 8.4* 02/16/2015 0418   PROT 6.2* 07/10/2015 0814   PROT 6.0* 12/18/2014 1540   ALBUMIN 3.3* 07/10/2015 0814   ALBUMIN 3.6 12/18/2014 1540   AST 20 07/10/2015 0814   AST 21 12/18/2014 1540   ALT 28 07/10/2015 0814   ALT 22 12/18/2014 1540   ALKPHOS 78 07/10/2015 0814   ALKPHOS 73 12/18/2014 1540   BILITOT 0.77 07/10/2015 0814   BILITOT 1.0 12/18/2014 1540   GFRNONAA 47* 02/16/2015 0418   GFRNONAA 55* 02/18/2013 0827   GFRAA 55* 02/16/2015 0418   GFRAA 63 02/18/2013 0827    INo results found for: SPEP, UPEP  Lab Results  Component Value Date   WBC 5.2 07/10/2015   NEUTROABS 3.4 07/10/2015   HGB 10.0* 07/10/2015   HCT 29.1* 07/10/2015   MCV 97.0 07/10/2015   PLT 174 07/10/2015      Chemistry      Component Value Date/Time   NA 142 07/10/2015 0814   NA 137 02/16/2015 0418   K 4.0 07/10/2015 0814   K 4.3 02/16/2015 0418   CL 107 02/16/2015 0418   CO2 23  07/10/2015 0814   CO2 23 02/16/2015 0418   BUN 23.8 07/10/2015 0814   BUN 18 02/16/2015 0418   CREATININE 1.2* 07/10/2015 0814   CREATININE 1.20* 02/16/2015 0418   CREATININE 1.10 02/18/2013 0827      Component Value Date/Time   CALCIUM 8.8 07/10/2015 0814   CALCIUM 8.4* 02/16/2015 0418   ALKPHOS 78 07/10/2015 0814   ALKPHOS 73 12/18/2014 1540   AST 20 07/10/2015 0814   AST 21 12/18/2014 1540   ALT 28 07/10/2015 0814   ALT 22 12/18/2014 1540   BILITOT 0.77 07/10/2015 0814   BILITOT 1.0 12/18/2014 1540       No results found for: LABCA2  No components found for: [id number]  No results for input(s): INR in the last 168 hours.  Urinalysis    Component Value Date/Time   COLORURINE YELLOW 12/18/2014 1635   APPEARANCEUR CLEAR 12/18/2014 1635   LABSPEC 1.023 12/18/2014 1635   PHURINE 5.0 12/18/2014 1635   GLUCOSEU NEGATIVE 12/18/2014 1635   HGBUR NEGATIVE 12/18/2014 1635   BILIRUBINUR NEGATIVE 12/18/2014 1635   BILIRUBINUR neg 06/29/2013 1017   KETONESUR NEGATIVE 12/18/2014 1635   PROTEINUR 30* 12/18/2014 1635   PROTEINUR trace 06/29/2013 1017   UROBILINOGEN negative 06/29/2013 1017   NITRITE NEGATIVE 12/18/2014 1635   NITRITE neg 06/29/2013 1017   LEUKOCYTESUR NEGATIVE 12/18/2014 1635    STUDIES: US Thyroid Biopsy  06/28/2015  INDICATION: Indeterminate thyroid nodules Right Mid Pole 1.9 cm x 1.2 cm x 1.9 cm Left Lower Pole 2.8 cm x 1.7 cm x 2.3 cm EXAM: ULTRASOUND GUIDED THYROID FINE NEEDLE ASPIRATION x2  COMPARISON:  US Thyroid 05/31/2015 MEDICATIONS: 5 cc 1% lidocaine x 2 COMPLICATIONS: None immediate. TECHNIQUE: Informed written consent was obtained from the patient after a discussion of the risks, benefits and alternatives to treatment. Questions regarding the procedure were encouraged and answered. A timeout was performed prior to the initiation of the procedure. Pre-procedural ultrasound scanning demonstrated Bilateral thyroid nodules The procedures were planned. The  neck was prepped in the usual sterile fashion, and a sterile drape was applied covering the operative field. A timeout was performed prior to the initiation of the procedure. Local anesthesia was provided with 1% lidocaine. Under direct ultrasound guidance, 3 FNA biopsies were performed of the right with a 25 gauge needle. 2 FNA biopsies were also performed of Right thyroid nodule for AFFIRMA. 5 total samples were prepared and submitted to pathology. Under direct ultrasound guidance, 3 FNA biopsies were performed of the left with a 25 gauge needle. 1 cc colloid material collected and submitted. 2 FNA biopsies were also performed of Left thyroid nodule for AFFIRMA. 5 total samples were prepared and submitted to pathology. Limited post procedural scanning was negative for hematoma or additional complication. Dressings were placed. The patient tolerated the above procedures procedure well without immediate postprocedural complication. IMPRESSION: Technically successful ultrasound guided fine needle aspiration of Bilateral thyroid nodules. Read by:  Lavonia Drafts [facility] Electronically Signed   By: Sandi Mariscal M.D.   On: 06/28/2015 09:03   US Thyroid Biopsy  06/28/2015  INDICATION: Indeterminate thyroid nodules Right Mid Pole 1.9 cm x 1.2 cm x 1.9 cm Left Lower Pole 2.8 cm x 1.7 cm x 2.3 cm EXAM: ULTRASOUND GUIDED THYROID FINE NEEDLE ASPIRATION x2 COMPARISON:  US Thyroid 05/31/2015 MEDICATIONS: 5 cc 1% lidocaine x 2 COMPLICATIONS: None immediate. TECHNIQUE: Informed written consent was obtained from the patient after a discussion of the risks, benefits and alternatives to treatment. Questions regarding the procedure were encouraged and answered. A timeout was performed prior to the initiation of the procedure. Pre-procedural ultrasound scanning demonstrated Bilateral thyroid nodules The procedures were planned. The neck was prepped in the usual sterile fashion, and a sterile drape was applied covering the operative  field. A timeout was performed prior to the initiation of the procedure. Local anesthesia was provided with 1% lidocaine. Under direct ultrasound guidance, 3 FNA biopsies were performed of the right with a 25 gauge needle. 2 FNA biopsies were also performed of Right thyroid nodule for AFFIRMA. 5 total samples were prepared and submitted to pathology. Under direct ultrasound guidance, 3 FNA biopsies were performed of the left with a 25 gauge needle. 1 cc colloid material collected and submitted. 2 FNA biopsies were also performed of Left thyroid nodule for AFFIRMA. 5 total samples were prepared and submitted to pathology. Limited post procedural scanning was negative for hematoma or additional complication. Dressings were placed. The patient tolerated the above procedures procedure well without immediate postprocedural complication. IMPRESSION: Technically successful ultrasound guided fine needle aspiration of Bilateral thyroid nodules. Read by:  Lavonia Drafts [facility] Electronically Signed   By: Sandi Mariscal M.D.   On: 06/28/2015 09:03    ASSESSMENT: 65 y.o. [place] woman status post right breast and right axillary lymph node biopsy 07/19/2014 for a cT2  pN1, stage IIB  invasive ductal carcinoma, grade 2, estrogen and progesterone receptor negative, with an MIB-1 of 90%, and HER-2 amplified  (1) neoadjuvant chemotherapy started 08/08/2014, consisting of carboplatin, docetaxel, trastuzumab and pertuzumab given every 3 weeks 6, completed 11/21/2014  (a) breast  MRI after initial 3 cycles shows a significant response  (b) breast MRI after 6 cycles shows a complete radiologic response   (2) trastuzumab to be continued through September 2017 (to end at end of capecitabine therapy)  (a) echo 02/13/2015 shows an EF of 60-65%  (b)  Echo 05/09/2015 shows an ejection fraction of 60-65%  (c) echocardiogram 06/27/2015 shows an ejection fraction of 55-60%.  (3) right lumpectomy with sentinel lymph node biopsy on  12/27/14 showed a residual  ypT2 ypN1a, stage IIB invasive ductal carcinoma, grade 2, with repeat estrogen and progesterone receptors again negative  (4) status post right oncoplastic surgery (with left reduction mammoplasty) 01/06/2015 showing in the right breast multiple foci of intralymphatic carcinoma in random sections  (5)  Right modified radical mastectomy 02/15/2014 showed no residual invasive disease; there was DCIS but margins were negative; all 15 axillary lymph nodes were clear; ypTis ypN0  (a)  The patient intends to have eventual reconstruction.  (6)  Postmastectomy radiation completed 05/18/2015 with capecitabine sensitization  (7) adjuvant capecitabine to continue into September  2017   (8) hepatic steatosis with increased fibrosis score (at risk for cirrhosis)  (9) thyroid biopsy (left lobe inferiorly) 2 shows benign tissue 06/28/2015   PLAN: Darlene [name] is Now a half a year out from her final surgery with no evidence of disease activity. This is very favorable area  The plan is to continue the trastuzumab and the capecitabine through September. She will see me with her last dose. After that we will repeat a left mammogram and at some point this year we will obtain a PET scan to completely restage her.  She is considering reconstruction but after discussion with plastics she feels a DIEP or even a routine flap may be "too much". She may just wear her prosthesis. She is agreeable to get her hepatitis C and HIV tested as requested by her gynecologist and I have put those orders.  She is going to have lab work every 2 weeks to make sure she can proceed to her subsequent cycles of capecitabine, with the third cycle beginning 2 days from now. She will have echocardiograms most likely next in September but she has an appointment with Dr. Aundra Dubin in July.  She will see me again with her final trastuzumab dose in September per she knows to call for any problems that may develop before that  visit.  Chauncey Cruel, MD   07/10/2015 9:31 AM

## 2015-07-10 NOTE — Patient Instructions (Signed)

## 2015-07-18 ENCOUNTER — Telehealth: Payer: Self-pay | Admitting: *Deleted

## 2015-07-18 NOTE — Telephone Encounter (Signed)
TC from patient asking if it was ok for her to have a routine eye exam while on Xeloda. She states that she would hold off if it turns out that the Xeloda can affect her vision. Advised pt that Xeloda may only cause some conjunctivitis but not visual changes. Advised patient that it would be fine for her to go ahead with her eye exam.  Pt voiced understanding.

## 2015-07-19 ENCOUNTER — Telehealth: Payer: Self-pay | Admitting: Oncology

## 2015-07-19 NOTE — Telephone Encounter (Signed)
Returned call adn s.w. Pt and r/s 8.22 appt to later time...the patient ok and aware of new d.t

## 2015-07-21 ENCOUNTER — Inpatient Hospital Stay (HOSPITAL_COMMUNITY)
Admission: EM | Admit: 2015-07-21 | Discharge: 2015-07-23 | DRG: 340 | Disposition: A | Discharge status: Home / Self Care | Payer: BLUE CROSS/BLUE SHIELD | Attending: Surgery | Admitting: Surgery

## 2015-07-21 ENCOUNTER — Encounter (HOSPITAL_COMMUNITY): Payer: Self-pay | Admitting: Emergency Medicine

## 2015-07-21 DIAGNOSIS — K3580 Unspecified acute appendicitis: Secondary | ICD-10-CM

## 2015-07-21 DIAGNOSIS — K353 Acute appendicitis with localized peritonitis, without perforation or gangrene: Secondary | ICD-10-CM

## 2015-07-21 DIAGNOSIS — Z8249 Family history of ischemic heart disease and other diseases of the circulatory system: Secondary | ICD-10-CM

## 2015-07-21 DIAGNOSIS — Z923 Personal history of irradiation: Secondary | ICD-10-CM

## 2015-07-21 DIAGNOSIS — Z9884 Bariatric surgery status: Secondary | ICD-10-CM

## 2015-07-21 DIAGNOSIS — K3533 Acute appendicitis with perforation and localized peritonitis, with abscess: Secondary | ICD-10-CM | POA: Diagnosis present

## 2015-07-21 DIAGNOSIS — E1140 Type 2 diabetes mellitus with diabetic neuropathy, unspecified: Secondary | ICD-10-CM

## 2015-07-21 DIAGNOSIS — I10 Essential (primary) hypertension: Secondary | ICD-10-CM | POA: Diagnosis present

## 2015-07-21 DIAGNOSIS — Z794 Long term (current) use of insulin: Secondary | ICD-10-CM

## 2015-07-21 DIAGNOSIS — E119 Type 2 diabetes mellitus without complications: Secondary | ICD-10-CM

## 2015-07-21 DIAGNOSIS — E785 Hyperlipidemia, unspecified: Secondary | ICD-10-CM | POA: Diagnosis present

## 2015-07-21 DIAGNOSIS — Z833 Family history of diabetes mellitus: Secondary | ICD-10-CM

## 2015-07-21 DIAGNOSIS — Z9989 Dependence on other enabling machines and devices: Secondary | ICD-10-CM | POA: Diagnosis present

## 2015-07-21 DIAGNOSIS — R109 Unspecified abdominal pain: Secondary | ICD-10-CM | POA: Diagnosis not present

## 2015-07-21 DIAGNOSIS — Z6830 Body mass index (BMI) 30.0-30.9, adult: Secondary | ICD-10-CM

## 2015-07-21 DIAGNOSIS — Z853 Personal history of malignant neoplasm of breast: Secondary | ICD-10-CM

## 2015-07-21 DIAGNOSIS — G4733 Obstructive sleep apnea (adult) (pediatric): Secondary | ICD-10-CM | POA: Diagnosis present

## 2015-07-21 HISTORY — DX: Epistaxis: R04.0

## 2015-07-21 HISTORY — DX: Pneumonia, unspecified organism: J18.9

## 2015-07-21 LAB — URINE MICROSCOPIC-ADD ON: WBC, UA: NONE SEEN WBC/hpf (ref 0–5)

## 2015-07-21 LAB — URINALYSIS, ROUTINE W REFLEX MICROSCOPIC
Bilirubin Urine: NEGATIVE
Glucose, UA: NEGATIVE mg/dL
Ketones, ur: NEGATIVE mg/dL
Leukocytes, UA: NEGATIVE
Nitrite: NEGATIVE
Protein, ur: 30 mg/dL — AB
Specific Gravity, Urine: 1.02 (ref 1.005–1.030)
pH: 6 (ref 5.0–8.0)

## 2015-07-21 MED ORDER — SODIUM CHLORIDE 0.9 % IV BOLUS (SEPSIS)
500.0000 mL | Freq: Once | INTRAVENOUS | Status: AC
  Administered 2015-07-22: 500 mL via INTRAVENOUS

## 2015-07-21 MED ORDER — SODIUM CHLORIDE 0.9 % IV SOLN
Freq: Once | INTRAVENOUS | Status: AC
  Administered 2015-07-22: via INTRAVENOUS

## 2015-07-21 MED ORDER — ONDANSETRON HCL 4 MG/2ML IJ SOLN
4.0000 mg | Freq: Once | INTRAMUSCULAR | Status: AC
  Administered 2015-07-22: 4 mg via INTRAVENOUS
  Filled 2015-07-21: qty 2

## 2015-07-21 NOTE — ED Notes (Signed)
Pt states she had a fever at home above 100 and states that she had middle abdominal (slightly right) that moved more toward her right flank. Pt has a low grade fever at this time (99). Pt states she feels nausea when the flank pain hits, but none currently. Pt denies emesis. Pt states she had a "gummy" stool this morning. Pt denies urinary symptoms.  Pt is currently undergoing treatment for breast cancer was October and her last radiation was in April. Pt takes PO chemo that she takes every other week.

## 2015-07-21 NOTE — ED Provider Notes (Signed)
CSN: [id number]     Arrival date & time 07/21/15  2232 History  By signing my name below, I, Ephriam Jenkins, attest that this documentation has been prepared under the direction and in the presence of Tanna Furry, MD. Electronically signed, Ephriam Jenkins, ED Scribe. 07/21/2015. 11:45 PM.     Chief Complaint  Patient presents with  . Fever  . Flank Pain   Patient is a 65 y.o. female presenting with flank pain. The history is provided by the patient and the spouse. No language interpreter was used.  Flank Pain Associated symptoms include abdominal pain (right ). Pertinent negatives include no chest pain, no headaches and no shortness of breath.   HPI Comments: Darlene [name] is a 65 y.o. female with a PMHx of DM, CA, HTN, HLD, Obese, who presents to the Emergency  Department complaining of waxing and waning, sharp, right sided abdominal pain. Pt states she started having stomach cramps this morning and also reports she had a soft bowel movement. Pt also reports a fever of 100.1 taken at home around noon; as well as intermittent nausea throughout the day today. Pt reports that the pain started at her central abdominal but has shifted to the right side. Pt reports that her pain is exacerbated when coughing or sneezing. Pt has a PSHx of double mastectomy, gastric bypass surgery. Takes perseptum every 3 weeks. Pt states she has had a Hx of red blood cell count being low during chemotherapy, but no Hx of WBC being low. Pt states she finished radiation therapy in April. Pt has not taken any medications for her pain. Pt denies any Hx of appendectomy, cholecystectomy. Pt denies noticing any rash on her stomach. Pt denies, cough, chest pain. Pt denies any flank pain, dysuria, urinary frequency. Past Medical History  Diagnosis Date  . Obesity   . Hypertension   . Hyperlipidemia   . Diabetes mellitus without complication ([place])     [id number] years  . Eating disorder     binge eating  . Balance problem   .  Arthritis     in fingers, shoulders  . Anemia     hx of  . Depression   . Ovarian cyst rupture     possible  . Clumsiness   . Breast cancer of upper-outer quadrant of right female breast ([place]) 07/22/2014  . Breast cancer ([place])   . Anxiety   . Pneumonia   . Neuromuscular disorder ([place])     diabetic neuropathy  . Obstructive sleep apnea on CPAP     uses cpap nightly  . Heart murmur   . Radiation 04/05/15-05/19/15    right chest wall 50.4 Gy, mastectomy scar 10 Gy  . Thyroid nodule 06/2014  . Pneumonia 11/2014  . HCAP (healthcare-associated pneumonia) 12/18/2014  . Epistaxis 08/26/2014   Past Surgical History  Procedure Laterality Date  . Carpal tunnel release Right   . Carpal tunnel release Left   . Breath tek h pylori N/A 09/15/2012    Procedure: BREATH TEK H PYLORI;  Surgeon: Pedro Earls, MD;  Location: [facility];  Service: General;  Laterality: N/A;  . Dupuytren contracture release    . Trigger finger release      x3  . Toe surgery  1970s    bone spur  . Rotator cuff repair Right 2005  . Gastric roux-en-y N/A 11/02/2012    Procedure: LAPAROSCOPIC ROUX-EN-Y GASTRIC;  Surgeon: Pedro Earls, MD;  Location: [facility];  Service: General;  Laterality:  N/A;  . Bunionectomy Left 12/2013  . Portacath placement Left 08/02/2014    Procedure: INSERTION PORT-A-CATH;  Surgeon: Stark Klein, MD;  Location: [facility];  Service: General;  Laterality: Left;  . Breast lumpectomy with radioactive seed and sentinel lymph node biopsy Right 12/27/2014    Procedure: BREAST LUMPECTOMY WITH RADIOACTIVE SEED AND SENTINEL LYMPH NODE BIOPSY;  Surgeon: Stark Klein, MD;  Location: [facility];  Service: General;  Laterality: Right;  . Breast reduction surgery Bilateral 01/06/2015    Procedure: BILATERAL BREAST REDUCTION, RIGHT ONCOPLASTIC RECONSTRUCTION),LEFT BREAST REDUCTION FOR SYMMETRY;  Surgeon: [name], MD;  Location: [facility];  Service: Plastics;  Laterality:  Bilateral;  . Mastectomy Right 02/15/2015    modified  . Modified mastectomy Right 02/15/2015    Procedure: RIGHT MODIFIED RADICAL MASTECTOMY;  Surgeon: Stark Klein, MD;  Location: [facility];  Service: General;  Laterality: Right;  . Biopsy thyroid Bilateral 06/2014    2 nodules - Benign    Family History  Problem Relation Age of Onset  . CVA Mother   . Diabetes Father   . Heart failure Father   . Hypertension Sister   . Diabetes Brother   . Diabetes Paternal Uncle   . Breast cancer Paternal Grandmother    Social History  Substance Use Topics  . Smoking status: Never Smoker   . Smokeless tobacco: Never Used  . Alcohol Use: 0.0 oz/week    0 Standard drinks or equivalent per week     Comment: social   OB History    Gravida Para Term Preterm AB TAB SAB Ectopic Multiple Living   1    1          Review of Systems  Constitutional: Positive for fever. Negative for chills, diaphoresis, appetite change and fatigue.  HENT: Negative for mouth sores, sore throat and trouble swallowing.   Eyes: Negative for visual disturbance.  Respiratory: Negative for cough, chest tightness, shortness of breath and wheezing.   Cardiovascular: Negative for chest pain.  Gastrointestinal: Positive for abdominal pain (right ). Negative for nausea, vomiting, diarrhea and abdominal distention.  Endocrine: Negative for polydipsia, polyphagia and polyuria.  Genitourinary: Negative for dysuria, frequency and hematuria.  Musculoskeletal: Negative for gait problem.  Skin: Negative for color change, pallor and rash.  Neurological: Negative for dizziness, syncope, light-headedness and headaches.  Hematological: Does not bruise/bleed easily.  Psychiatric/Behavioral: Negative for behavioral problems and confusion.      Allergies  Peanuts and Tape  Home Medications   Prior to Admission medications   Medication Sig Start Date End Date Taking? Authorizing Provider  calcium citrate-vitamin D (CITRACAL+D) 315-200  MG-UNIT tablet Take 1 tablet by mouth 3 (three) times daily.   Yes Historical Provider, MD  capecitabine (XELODA) 500 MG tablet Take 3 tablets by mouth after a meal 2 times a day seven days on, seven days off 05/29/15  Yes Chauncey Cruel, MD  diphenhydrAMINE (BENADRYL) 25 MG tablet Take 25 mg by mouth daily as needed for allergies or sleep. Reported on 03/06/2015   Yes Historical Provider, MD  glucose blood (ONETOUCH VERIO) test strip 1 each by Other route as needed for other. Reported on 03/06/2015   Yes Historical Provider, MD  losartan (COZAAR) 100 MG tablet Take 1 tablet by mouth daily. Reported on 03/06/2015 05/19/13  Yes Historical Provider, MD  Melatonin 5 MG TABS Take 5 mg by mouth at bedtime. Reported on 03/06/2015   Yes Historical Provider, MD  metoCLOPramide (REGLAN) 5  MG tablet Take 1 tablet (5 mg total) by mouth 3 (three) times daily before meals. 03/27/15  Yes Chauncey Cruel, MD  mometasone (NASONEX) 50 MCG/ACT nasal spray Place 1-2 sprays into the nose daily as needed (for allergies.).    Yes Historical Provider, MD  NOVOLIN 70/30 (70-30) 100 UNIT/ML injection Inject 15 Units into the skin 2 (two) times daily with a meal. Reported on 03/06/2015 06/03/14  Yes Historical Provider, MD  Pediatric Multivitamins-Iron (FLINTSTONES PLUS IRON) chewable tablet Chew 1 tablet by mouth 2 (two) times daily.   Yes Historical Provider, MD  Probiotic Product ([employer] PROBIOTIC PO) Take 1 capsule by mouth daily. Reported on 03/06/2015 08/11/14  Yes Historical Provider, MD  Trastuzumab (HERCEPTIN IV) Inject into the vein every 21 ( twenty-one) days. Reported on 03/06/2015   Yes Historical Provider, MD  Vortioxetine HBr (TRINTELLIX) 20 MG TABS Take 20 mg by mouth daily. Reported on 03/06/2015   Yes Historical Provider, MD  acetaminophen (TYLENOL) 325 MG tablet Take 650 mg by mouth 2 (two) times daily as needed for moderate pain or headache. Reported on 04/17/2015    Historical Provider, MD   BP 157/75 mmHg  Pulse 86   Temp(Src) 99 F (37.2 C) (Oral)  Resp 16  Ht 5\' 9"  (1.753 m)  Wt 209 lb (94.802 kg)  BMI 30.85 kg/m2  SpO2 95%  LMP 10/22/2007 Physical Exam  Constitutional: She is oriented to person, place, and time. She appears well-developed and well-nourished. No distress.  HENT:  Head: Normocephalic.  Eyes: Conjunctivae are normal. Pupils are equal, round, and reactive to light. No scleral icterus.  Neck: Normal range of motion. Neck supple. No thyromegaly present.  Cardiovascular: Normal rate and regular rhythm.  Exam reveals no gallop and no friction rub.   No murmur heard. Pulmonary/Chest: Effort normal and breath sounds normal. No respiratory distress. She has no wheezes. She has no rales.  Abdominal: Soft. Bowel sounds are normal. She exhibits no distension. There is tenderness. There is no rebound.  Tender to palpation right mid abdomen, also pain with coughing and movement.   Musculoskeletal: Normal range of motion.  Neurological: She is alert and oriented to person, place, and time.  Skin: Skin is warm and dry. No rash noted.  Psychiatric: She has a normal mood and affect. Her behavior is normal.  Nursing note and vitals reviewed.   ED Course  Procedures  DIAGNOSTIC STUDIES: Oxygen Saturation is 96% on RA, normal by my interpretation.  COORDINATION OF CARE: 11:44 PM-Will order imaging. Discussed treatment plan with pt at bedside and pt agreed to plan.   Labs Review Labs Reviewed  URINALYSIS, ROUTINE W REFLEX MICROSCOPIC (NOT AT [facility]) - Abnormal; Notable for the following:    Hgb urine dipstick TRACE (*)    Protein, ur 30 (*)    All other components within normal limits  CBC WITH DIFFERENTIAL/PLATELET - Abnormal; Notable for the following:    RBC 2.93 (*)    Hemoglobin 9.7 (*)    HCT 27.4 (*)    All other components within normal limits  COMPREHENSIVE METABOLIC PANEL - Abnormal; Notable for the following:    Glucose, Bld 177 (*)    BUN 31 (*)    Creatinine, Ser 1.08 (*)     Total Bilirubin 1.4 (*)    GFR calc non Af Amer 54 (*)    All other components within normal limits  URINE MICROSCOPIC-ADD ON - Abnormal; Notable for the following:    Squamous Epithelial /  LPF 0-5 (*)    Bacteria, UA RARE (*)    All other components within normal limits    Imaging Review Ct Abdomen Pelvis W Contrast  07/22/2015  CLINICAL DATA:  65 year old female with right-sided abdominal pain and fever. EXAM: CT ABDOMEN AND PELVIS WITH CONTRAST TECHNIQUE: Multidetector CT imaging of the abdomen and pelvis was performed using the standard protocol following bolus administration of intravenous contrast. CONTRAST:  179mL ISOVUE-300 IOPAMIDOL (ISOVUE-300) INJECTION 61% COMPARISON:  And right upper quadrant ultrasound dated 05/10/2015 FINDINGS: The visualized lung bases are clear. There is coronary vascular calcification. No intra-abdominal free air or free fluid. There is mild irregularity of the hepatic contour. Clinical correlation is recommended to evaluate for underlying mild cirrhosis. Multiple stones noted within the gallbladder. No pericholecystic fluid. The pancreas, spleen, and the right adrenal gland appear unremarkable. Mild left adrenal nodularity versus hyperplasia. There is a 6 mm nonobstructing left renal inferior pole calculus. No hydronephrosis. The right kidney appears unremarkable. The visualized ureters and urinary bladder appear unremarkable. The uterus is anteverted. Small calcified uterine fibroids noted. The visualized ovaries appear unremarkable. There is postsurgical changes of gastric bypass. There is haziness and stranding of the fat adjacent to the gastrojejunostomy in the left upper abdomen. No evidence bowel obstruction. There scattered colonic diverticula without active inflammatory changes. The appendix is enlarged and inflamed. It is located in the right lower quadrant lateral to the cecum. High attenuating content within the appendix may represent appendicolith versus  oral contrast. There is no evidence of perforation. No abscess. There is mild aortoiliac atherosclerotic disease. There is atherosclerotic calcification of the mesentery vasculature. No portal venous gas identified. Multiple top-normal lymph nodes noted in the left upper abdomen, likely reactive. There is no adenopathy. The abdominal wall soft tissues appear unremarkable. There is degenerative changes of the spine. No acute fracture. IMPRESSION: Acute appendicitis.  No evidence of perforation or abscess. Postsurgical changes of gastric bypass with mild haziness of the fat at the gastric jejunostomy may represent mild inflammation. Multiple top-normal reactive lymph nodes noted. Clinical correlation is recommended. Electronically Signed   By: Anner Crete M.D.   On: 07/22/2015 01:33   I have personally reviewed and evaluated these images and lab results as part of my medical decision-making.   EKG Interpretation None      MDM   Final diagnoses:  Acute appendicitis with localized peritonitis    CT, history, exam all consistent with acute appendicitis. Dr. Joanie Coddington of Gen. surgery consult. He is at the bedside.  I personally performed the services described in this documentation, which was scribed in my presence. The recorded information has been reviewed and is accurate.    Tanna Furry, MD 07/22/15 [phone]

## 2015-07-22 ENCOUNTER — Emergency Department (HOSPITAL_COMMUNITY): Payer: BLUE CROSS/BLUE SHIELD

## 2015-07-22 ENCOUNTER — Emergency Department (HOSPITAL_COMMUNITY): Payer: BLUE CROSS/BLUE SHIELD | Admitting: Anesthesiology

## 2015-07-22 ENCOUNTER — Encounter (HOSPITAL_COMMUNITY)
Admission: EM | Disposition: A | Discharge status: Home / Self Care | Payer: Self-pay | Source: Home / Self Care | Attending: Surgery

## 2015-07-22 ENCOUNTER — Encounter (HOSPITAL_COMMUNITY): Payer: Self-pay | Admitting: Surgery

## 2015-07-22 DIAGNOSIS — E1140 Type 2 diabetes mellitus with diabetic neuropathy, unspecified: Secondary | ICD-10-CM | POA: Diagnosis present

## 2015-07-22 DIAGNOSIS — Z833 Family history of diabetes mellitus: Secondary | ICD-10-CM | POA: Diagnosis not present

## 2015-07-22 DIAGNOSIS — Z9884 Bariatric surgery status: Secondary | ICD-10-CM | POA: Diagnosis not present

## 2015-07-22 DIAGNOSIS — Z794 Long term (current) use of insulin: Secondary | ICD-10-CM | POA: Diagnosis not present

## 2015-07-22 DIAGNOSIS — I10 Essential (primary) hypertension: Secondary | ICD-10-CM | POA: Diagnosis present

## 2015-07-22 DIAGNOSIS — Z853 Personal history of malignant neoplasm of breast: Secondary | ICD-10-CM | POA: Diagnosis not present

## 2015-07-22 DIAGNOSIS — E785 Hyperlipidemia, unspecified: Secondary | ICD-10-CM | POA: Diagnosis present

## 2015-07-22 DIAGNOSIS — Z6830 Body mass index (BMI) 30.0-30.9, adult: Secondary | ICD-10-CM | POA: Diagnosis not present

## 2015-07-22 DIAGNOSIS — K353 Acute appendicitis with localized peritonitis: Secondary | ICD-10-CM | POA: Diagnosis present

## 2015-07-22 DIAGNOSIS — Z923 Personal history of irradiation: Secondary | ICD-10-CM | POA: Diagnosis not present

## 2015-07-22 DIAGNOSIS — K3533 Acute appendicitis with perforation and localized peritonitis, with abscess: Secondary | ICD-10-CM | POA: Diagnosis present

## 2015-07-22 DIAGNOSIS — R109 Unspecified abdominal pain: Secondary | ICD-10-CM | POA: Diagnosis present

## 2015-07-22 DIAGNOSIS — G4733 Obstructive sleep apnea (adult) (pediatric): Secondary | ICD-10-CM | POA: Diagnosis present

## 2015-07-22 DIAGNOSIS — K3580 Unspecified acute appendicitis: Secondary | ICD-10-CM

## 2015-07-22 DIAGNOSIS — Z8249 Family history of ischemic heart disease and other diseases of the circulatory system: Secondary | ICD-10-CM | POA: Diagnosis not present

## 2015-07-22 HISTORY — PX: LAPAROSCOPIC APPENDECTOMY: SHX408

## 2015-07-22 LAB — GLUCOSE, CAPILLARY
Glucose-Capillary: 193 mg/dL — ABNORMAL HIGH (ref 65–99)
Glucose-Capillary: 197 mg/dL — ABNORMAL HIGH (ref 65–99)
Glucose-Capillary: 144 mg/dL — ABNORMAL HIGH (ref 65–99)
Glucose-Capillary: 108 mg/dL — ABNORMAL HIGH (ref 65–99)

## 2015-07-22 LAB — COMPREHENSIVE METABOLIC PANEL
ALT: 17 U/L (ref 14–54)
AST: 19 U/L (ref 15–41)
Albumin: 3.9 g/dL (ref 3.5–5.0)
Alkaline Phosphatase: 78 U/L (ref 38–126)
Anion gap: 7 (ref 5–15)
BUN: 31 mg/dL — ABNORMAL HIGH (ref 6–20)
CO2: 24 mmol/L (ref 22–32)
Calcium: 9 mg/dL (ref 8.9–10.3)
Chloride: 107 mmol/L (ref 101–111)
Creatinine, Ser: 1.08 mg/dL — ABNORMAL HIGH (ref 0.44–1.00)
GFR calc Af Amer: >60 mL/min (ref >60)
GFR calc non Af Amer: 54 mL/min — ABNORMAL LOW (ref >60)
Glucose, Bld: 177 mg/dL — ABNORMAL HIGH (ref 65–99)
Potassium: 3.7 mmol/L (ref 3.5–5.1)
Sodium: 138 mmol/L (ref 135–145)
Total Bilirubin: 1.4 mg/dL — ABNORMAL HIGH (ref 0.3–1.2)
Total Protein: 6.6 g/dL (ref 6.5–8.1)

## 2015-07-22 LAB — CBC WITH DIFFERENTIAL/PLATELET
Basophils Absolute: 0 10*3/uL (ref 0.0–0.1)
Basophils Relative: 0 %
Eosinophils Absolute: 0.1 10*3/uL (ref 0.0–0.7)
Eosinophils Relative: 1 %
HCT: 27.4 % — ABNORMAL LOW (ref 36.0–46.0)
Hemoglobin: 9.7 g/dL — ABNORMAL LOW (ref 12.0–15.0)
Lymphocytes Relative: 11 %
Lymphs Abs: 1 10*3/uL (ref 0.7–4.0)
MCH: 33.1 pg (ref 26.0–34.0)
MCHC: 35.4 g/dL (ref 30.0–36.0)
MCV: 93.5 fL (ref 78.0–100.0)
Monocytes Absolute: 0.9 10*3/uL (ref 0.1–1.0)
Monocytes Relative: 10 %
Neutro Abs: 7.2 10*3/uL (ref 1.7–7.7)
Neutrophils Relative %: 78 %
Platelets: 194 10*3/uL (ref 150–400)
RBC: 2.93 MIL/uL — ABNORMAL LOW (ref 3.87–5.11)
RDW: 15 % (ref 11.5–15.5)
WBC: 9.2 10*3/uL (ref 4.0–10.5)

## 2015-07-22 LAB — CBG MONITORING, ED: Glucose-Capillary: 142 mg/dL — ABNORMAL HIGH (ref 65–99)

## 2015-07-22 SURGERY — APPENDECTOMY, LAPAROSCOPIC
Anesthesia: General | Site: Abdomen

## 2015-07-22 MED ORDER — GABAPENTIN 300 MG PO CAPS
300.0000 mg | ORAL_CAPSULE | ORAL | Status: AC
  Administered 2015-07-22: 300 mg via ORAL
  Filled 2015-07-22: qty 1

## 2015-07-22 MED ORDER — SODIUM CHLORIDE 0.9 % IV SOLN
INTRAVENOUS | Status: DC
  Administered 2015-07-22 – 2015-07-23 (×4): via INTRAVENOUS

## 2015-07-22 MED ORDER — PIPERACILLIN-TAZOBACTAM 3.375 G IVPB: 3.3750 g | Freq: Once | INTRAVENOUS | Status: DC

## 2015-07-22 MED ORDER — LACTATED RINGERS IV SOLN
INTRAVENOUS | Status: DC | PRN
  Administered 2015-07-22 (×2): via INTRAVENOUS

## 2015-07-22 MED ORDER — IOPAMIDOL (ISOVUE-300) INJECTION 61%
100.0000 mL | Freq: Once | INTRAVENOUS | Status: AC | PRN
  Administered 2015-07-22: 100 mL via INTRAVENOUS

## 2015-07-22 MED ORDER — METOPROLOL TARTRATE 5 MG/5ML IV SOLN: 5.0000 mg | Freq: Four times a day (QID) | INTRAVENOUS | Status: DC | PRN

## 2015-07-22 MED ORDER — BISACODYL 10 MG RE SUPP: 10.0000 mg | Freq: Two times a day (BID) | RECTAL | Status: DC | PRN

## 2015-07-22 MED ORDER — FENTANYL CITRATE (PF) 100 MCG/2ML IJ SOLN
INTRAMUSCULAR | Status: DC | PRN
  Administered 2015-07-22: 100 ug via INTRAVENOUS
  Administered 2015-07-22: 50 ug via INTRAVENOUS

## 2015-07-22 MED ORDER — PHENOL 1.4 % MT LIQD
2.0000 | OROMUCOSAL | Status: DC | PRN
  Filled 2015-07-22: qty 177

## 2015-07-22 MED ORDER — MENTHOL 3 MG MT LOZG
1.0000 | LOZENGE | OROMUCOSAL | Status: DC | PRN
  Filled 2015-07-22: qty 9

## 2015-07-22 MED ORDER — EPHEDRINE SULFATE 50 MG/ML IJ SOLN
INTRAMUSCULAR | Status: AC
  Filled 2015-07-22: qty 1

## 2015-07-22 MED ORDER — SIMETHICONE 80 MG PO CHEW: 40.0000 mg | CHEWABLE_TABLET | Freq: Four times a day (QID) | ORAL | Status: DC | PRN

## 2015-07-22 MED ORDER — INSULIN ASPART 100 UNIT/ML ~~LOC~~ SOLN
0.0000 [IU] | Freq: Three times a day (TID) | SUBCUTANEOUS | Status: DC
  Administered 2015-07-22: 4 [IU] via SUBCUTANEOUS
  Administered 2015-07-22 – 2015-07-23 (×2): 3 [IU] via SUBCUTANEOUS
  Administered 2015-07-23: 2 [IU] via SUBCUTANEOUS

## 2015-07-22 MED ORDER — MIDAZOLAM HCL 5 MG/5ML IJ SOLN
INTRAMUSCULAR | Status: DC | PRN
  Administered 2015-07-22: 2 mg via INTRAVENOUS

## 2015-07-22 MED ORDER — GLYCOPYRROLATE 0.2 MG/ML IJ SOLN
INTRAMUSCULAR | Status: DC | PRN
  Administered 2015-07-22: 0.2 mg via INTRAVENOUS

## 2015-07-22 MED ORDER — PROPOFOL 10 MG/ML IV BOLUS
INTRAVENOUS | Status: DC | PRN
  Administered 2015-07-22: 200 mg via INTRAVENOUS

## 2015-07-22 MED ORDER — MAGIC MOUTHWASH
15.0000 mL | Freq: Four times a day (QID) | ORAL | Status: DC | PRN
  Filled 2015-07-22: qty 15

## 2015-07-22 MED ORDER — MIDAZOLAM HCL 2 MG/2ML IJ SOLN
INTRAMUSCULAR | Status: AC
  Filled 2015-07-22: qty 2

## 2015-07-22 MED ORDER — OXYCODONE HCL 5 MG PO TABS: 5.0000 mg | ORAL_TABLET | ORAL | Status: DC | PRN

## 2015-07-22 MED ORDER — INSULIN ASPART 100 UNIT/ML ~~LOC~~ SOLN: 0.0000 [IU] | Freq: Every day | SUBCUTANEOUS | Status: DC

## 2015-07-22 MED ORDER — METHOCARBAMOL 1000 MG/10ML IJ SOLN
1000.0000 mg | Freq: Four times a day (QID) | INTRAVENOUS | Status: DC | PRN
  Filled 2015-07-22: qty 10

## 2015-07-22 MED ORDER — MEPERIDINE HCL 50 MG/ML IJ SOLN
INTRAMUSCULAR | Status: AC
  Filled 2015-07-22: qty 1

## 2015-07-22 MED ORDER — SUCCINYLCHOLINE CHLORIDE 20 MG/ML IJ SOLN
INTRAMUSCULAR | Status: DC | PRN
  Administered 2015-07-22: 100 mg via INTRAVENOUS

## 2015-07-22 MED ORDER — LIDOCAINE HCL (CARDIAC) 20 MG/ML IV SOLN
INTRAVENOUS | Status: AC
  Filled 2015-07-22: qty 5

## 2015-07-22 MED ORDER — METHOCARBAMOL 500 MG PO TABS: 500.0000 mg | ORAL_TABLET | Freq: Four times a day (QID) | ORAL | Status: DC | PRN

## 2015-07-22 MED ORDER — ONDANSETRON 4 MG PO TBDP: 4.0000 mg | ORAL_TABLET | Freq: Four times a day (QID) | ORAL | Status: DC | PRN

## 2015-07-22 MED ORDER — OXYCODONE HCL 5 MG/5ML PO SOLN
5.0000 mg | Freq: Once | ORAL | Status: DC | PRN
  Filled 2015-07-22: qty 5

## 2015-07-22 MED ORDER — CHLORHEXIDINE GLUCONATE CLOTH 2 % EX PADS
6.0000 | MEDICATED_PAD | Freq: Once | CUTANEOUS | Status: AC
  Administered 2015-07-22: 6 via TOPICAL

## 2015-07-22 MED ORDER — ACETAMINOPHEN 500 MG PO TABS
1000.0000 mg | ORAL_TABLET | ORAL | Status: AC
  Administered 2015-07-22: 1000 mg via ORAL
  Filled 2015-07-22: qty 2

## 2015-07-22 MED ORDER — ACETAMINOPHEN 650 MG RE SUPP: 650.0000 mg | Freq: Four times a day (QID) | RECTAL | Status: DC | PRN

## 2015-07-22 MED ORDER — DEXTROSE 5 % IV SOLN
2.0000 g | INTRAVENOUS | Status: AC
  Administered 2015-07-22: 2 g via INTRAVENOUS
  Filled 2015-07-22: qty 2

## 2015-07-22 MED ORDER — DIATRIZOATE MEGLUMINE & SODIUM 66-10 % PO SOLN
15.0000 mL | Freq: Once | ORAL | Status: AC
  Administered 2015-07-22: 15 mL via ORAL

## 2015-07-22 MED ORDER — PROPOFOL 10 MG/ML IV BOLUS
INTRAVENOUS | Status: AC
  Filled 2015-07-22: qty 20

## 2015-07-22 MED ORDER — LACTATED RINGERS IR SOLN
Status: DC | PRN
  Administered 2015-07-22: 3000 mL

## 2015-07-22 MED ORDER — LACTATED RINGERS IV BOLUS (SEPSIS)
1000.0000 mL | Freq: Once | INTRAVENOUS | Status: AC
  Administered 2015-07-22: 1000 mL via INTRAVENOUS

## 2015-07-22 MED ORDER — ROCURONIUM BROMIDE 100 MG/10ML IV SOLN
INTRAVENOUS | Status: DC | PRN
  Administered 2015-07-22: 30 mg via INTRAVENOUS
  Administered 2015-07-22: 10 mg via INTRAVENOUS

## 2015-07-22 MED ORDER — DEXTROSE 5 % IV SOLN
2.0000 g | INTRAVENOUS | Status: DC
  Administered 2015-07-22: 2 g via INTRAVENOUS
  Filled 2015-07-22 (×2): qty 2

## 2015-07-22 MED ORDER — FENTANYL CITRATE (PF) 250 MCG/5ML IJ SOLN
INTRAMUSCULAR | Status: AC
  Filled 2015-07-22: qty 5

## 2015-07-22 MED ORDER — METOCLOPRAMIDE HCL 10 MG PO TABS
5.0000 mg | ORAL_TABLET | Freq: Three times a day (TID) | ORAL | Status: DC
Start: 2015-07-22 — End: 2015-07-23
  Administered 2015-07-22 – 2015-07-23 (×4): 5 mg via ORAL
  Filled 2015-07-22 (×4): qty 1

## 2015-07-22 MED ORDER — LACTATED RINGERS IV SOLN: INTRAVENOUS | Status: DC

## 2015-07-22 MED ORDER — HYDROMORPHONE HCL 1 MG/ML IJ SOLN
0.5000 mg | INTRAMUSCULAR | Status: DC | PRN
  Administered 2015-07-22: 1 mg via INTRAVENOUS
  Filled 2015-07-22: qty 1

## 2015-07-22 MED ORDER — LIP MEDEX EX OINT
1.0000 "application " | TOPICAL_OINTMENT | Freq: Two times a day (BID) | CUTANEOUS | Status: DC
  Administered 2015-07-22 – 2015-07-23 (×3): 1 via TOPICAL
  Filled 2015-07-22 (×2): qty 7

## 2015-07-22 MED ORDER — MEPERIDINE HCL 50 MG/ML IJ SOLN
6.2500 mg | INTRAMUSCULAR | Status: DC | PRN
  Administered 2015-07-22: 12.5 mg via INTRAVENOUS

## 2015-07-22 MED ORDER — FLUTICASONE PROPIONATE 50 MCG/ACT NA SUSP
2.0000 | Freq: Every day | NASAL | Status: DC
  Administered 2015-07-22: 2 via NASAL
  Filled 2015-07-22 (×2): qty 16

## 2015-07-22 MED ORDER — DIPHENHYDRAMINE HCL 50 MG/ML IJ SOLN: 12.5000 mg | Freq: Four times a day (QID) | INTRAMUSCULAR | Status: DC | PRN

## 2015-07-22 MED ORDER — METRONIDAZOLE IN NACL 5-0.79 MG/ML-% IV SOLN
500.0000 mg | Freq: Three times a day (TID) | INTRAVENOUS | Status: DC
  Administered 2015-07-22 (×2): 500 mg via INTRAVENOUS
  Filled 2015-07-22 (×2): qty 100

## 2015-07-22 MED ORDER — ONDANSETRON HCL 4 MG/2ML IJ SOLN
INTRAMUSCULAR | Status: AC
  Filled 2015-07-22: qty 2

## 2015-07-22 MED ORDER — ONDANSETRON HCL 4 MG/2ML IJ SOLN
INTRAMUSCULAR | Status: DC | PRN
  Administered 2015-07-22: 4 mg via INTRAVENOUS

## 2015-07-22 MED ORDER — OXYCODONE HCL 5 MG PO TABS: 5.0000 mg | ORAL_TABLET | Freq: Once | ORAL | Status: DC | PRN

## 2015-07-22 MED ORDER — DIPHENHYDRAMINE HCL 12.5 MG/5ML PO ELIX: 12.5000 mg | ORAL_SOLUTION | Freq: Four times a day (QID) | ORAL | Status: DC | PRN

## 2015-07-22 MED ORDER — LACTATED RINGERS IV BOLUS (SEPSIS): 1000.0000 mL | Freq: Three times a day (TID) | INTRAVENOUS | Status: DC | PRN

## 2015-07-22 MED ORDER — SUGAMMADEX SODIUM 200 MG/2ML IV SOLN
INTRAVENOUS | Status: DC | PRN
  Administered 2015-07-22: 200 mg via INTRAVENOUS

## 2015-07-22 MED ORDER — ONDANSETRON HCL 4 MG/2ML IJ SOLN: 4.0000 mg | Freq: Four times a day (QID) | INTRAMUSCULAR | Status: DC | PRN

## 2015-07-22 MED ORDER — ONDANSETRON HCL 4 MG/2ML IJ SOLN
4.0000 mg | Freq: Once | INTRAMUSCULAR | Status: AC
  Administered 2015-07-22: 4 mg via INTRAVENOUS
  Filled 2015-07-22: qty 2

## 2015-07-22 MED ORDER — ATROPINE SULFATE 0.4 MG/ML IJ SOLN
INTRAMUSCULAR | Status: AC
  Filled 2015-07-22: qty 1

## 2015-07-22 MED ORDER — ENOXAPARIN SODIUM 40 MG/0.4ML ~~LOC~~ SOLN
40.0000 mg | SUBCUTANEOUS | Status: DC
  Administered 2015-07-23: 40 mg via SUBCUTANEOUS
  Filled 2015-07-22: qty 0.4

## 2015-07-22 MED ORDER — BUPIVACAINE-EPINEPHRINE 0.25% -1:200000 IJ SOLN
INTRAMUSCULAR | Status: DC | PRN
  Administered 2015-07-22: 50 mL

## 2015-07-22 MED ORDER — 0.9 % SODIUM CHLORIDE (POUR BTL) OPTIME
TOPICAL | Status: DC | PRN
  Administered 2015-07-22: 1000 mL

## 2015-07-22 MED ORDER — PHENYLEPHRINE HCL 10 MG/ML IJ SOLN
INTRAMUSCULAR | Status: DC | PRN
  Administered 2015-07-22 (×2): 80 ug via INTRAVENOUS
  Administered 2015-07-22: 40 ug via INTRAVENOUS

## 2015-07-22 MED ORDER — SODIUM CHLORIDE 0.9 % IJ SOLN
INTRAMUSCULAR | Status: AC
  Filled 2015-07-22: qty 10

## 2015-07-22 MED ORDER — HYDROMORPHONE HCL 1 MG/ML IJ SOLN: 0.2500 mg | INTRAMUSCULAR | Status: DC | PRN

## 2015-07-22 MED ORDER — METRONIDAZOLE IN NACL 5-0.79 MG/ML-% IV SOLN
500.0000 mg | Freq: Three times a day (TID) | INTRAVENOUS | Status: DC
  Administered 2015-07-22 – 2015-07-23 (×3): 500 mg via INTRAVENOUS
  Filled 2015-07-22 (×3): qty 100

## 2015-07-22 MED ORDER — LIDOCAINE HCL (CARDIAC) 20 MG/ML IV SOLN
INTRAVENOUS | Status: DC | PRN
  Administered 2015-07-22: 50 mg via INTRAVENOUS

## 2015-07-22 MED ORDER — BUPIVACAINE-EPINEPHRINE 0.25% -1:200000 IJ SOLN
INTRAMUSCULAR | Status: AC
  Filled 2015-07-22: qty 2

## 2015-07-22 MED ORDER — ACETAMINOPHEN 325 MG PO TABS: 650.0000 mg | ORAL_TABLET | Freq: Four times a day (QID) | ORAL | Status: DC | PRN

## 2015-07-22 MED ORDER — METOPROLOL TARTRATE 12.5 MG HALF TABLET: 12.5000 mg | ORAL_TABLET | Freq: Two times a day (BID) | ORAL | Status: DC | PRN

## 2015-07-22 MED ORDER — MORPHINE SULFATE (PF) 4 MG/ML IV SOLN
4.0000 mg | INTRAVENOUS | Status: DC | PRN
  Administered 2015-07-22: 4 mg via INTRAVENOUS
  Filled 2015-07-22: qty 1

## 2015-07-22 MED ORDER — HYDRALAZINE HCL 20 MG/ML IJ SOLN: 10.0000 mg | INTRAMUSCULAR | Status: DC | PRN

## 2015-07-22 MED ORDER — ACETAMINOPHEN 500 MG PO TABS
1000.0000 mg | ORAL_TABLET | Freq: Three times a day (TID) | ORAL | Status: DC
  Administered 2015-07-22 – 2015-07-23 (×4): 1000 mg via ORAL
  Filled 2015-07-22 (×4): qty 2

## 2015-07-22 SURGICAL SUPPLY — 41 items
APPLIER CLIP 5 13 M/L LIGAMAX5 (MISCELLANEOUS)
APPLIER CLIP ROT 10 11.4 M/L (STAPLE)
CABLE HIGH FREQUENCY MONO STRZ (ELECTRODE) ×2 IMPLANT
CHLORAPREP W/TINT 26ML (MISCELLANEOUS) ×2 IMPLANT
CLIP APPLIE 5 13 M/L LIGAMAX5 (MISCELLANEOUS) IMPLANT
CLIP APPLIE ROT 10 11.4 M/L (STAPLE) IMPLANT
COVER SURGICAL LIGHT HANDLE (MISCELLANEOUS) IMPLANT
CUTTER FLEX LINEAR 45M (STAPLE) ×2 IMPLANT
DECANTER SPIKE VIAL GLASS SM (MISCELLANEOUS) ×2 IMPLANT
DEVICE TROCAR PUNCTURE CLOSURE (ENDOMECHANICALS) ×2 IMPLANT
DRAPE LAPAROSCOPIC ABDOMINAL (DRAPES) ×2 IMPLANT
DRAPE WARM FLUID 44X44 (DRAPE) ×2 IMPLANT
DRSG TEGADERM 2-3/8X2-3/4 SM (GAUZE/BANDAGES/DRESSINGS) ×4 IMPLANT
DRSG TEGADERM 4X4.75 (GAUZE/BANDAGES/DRESSINGS) ×2 IMPLANT
ELECT REM PT RETURN 9FT ADLT (ELECTROSURGICAL) ×2
ELECTRODE REM PT RTRN 9FT ADLT (ELECTROSURGICAL) ×1 IMPLANT
ENDOLOOP SUT PDS II  0 18 (SUTURE)
ENDOLOOP SUT PDS II 0 18 (SUTURE) IMPLANT
GAUZE SPONGE 2X2 8PLY STRL LF (GAUZE/BANDAGES/DRESSINGS) ×1 IMPLANT
GLOVE ECLIPSE 8.0 STRL XLNG CF (GLOVE) ×2 IMPLANT
GLOVE INDICATOR 8.0 STRL GRN (GLOVE) ×2 IMPLANT
GOWN STRL REUS W/TWL XL LVL3 (GOWN DISPOSABLE) ×4 IMPLANT
KIT BASIN OR (CUSTOM PROCEDURE TRAY) ×2 IMPLANT
PAD POSITIONING PINK XL (MISCELLANEOUS) ×2 IMPLANT
POUCH SPECIMEN RETRIEVAL 10MM (ENDOMECHANICALS) ×2 IMPLANT
RELOAD 45 VASCULAR/THIN (ENDOMECHANICALS) IMPLANT
RELOAD STAPLE TA45 3.5 REG BLU (ENDOMECHANICALS) ×2 IMPLANT
SCISSORS LAP 5X35 DISP (ENDOMECHANICALS) IMPLANT
SET IRRIG TUBING LAPAROSCOPIC (IRRIGATION / IRRIGATOR) ×2 IMPLANT
SHEARS HARMONIC ACE PLUS 36CM (ENDOMECHANICALS) ×2 IMPLANT
SLEEVE XCEL OPT CAN 5 100 (ENDOMECHANICALS) ×2 IMPLANT
SPONGE GAUZE 2X2 STER 10/PKG (GAUZE/BANDAGES/DRESSINGS) ×1
SUT MNCRL AB 4-0 PS2 18 (SUTURE) ×2 IMPLANT
SUT PDS AB 0 CT1 36 (SUTURE) ×2 IMPLANT
SUT SILK 2 0 SH (SUTURE) IMPLANT
TOWEL OR 17X26 10 PK STRL BLUE (TOWEL DISPOSABLE) ×2 IMPLANT
TRAY FOLEY W/METER SILVER 14FR (SET/KITS/TRAYS/PACK) ×2 IMPLANT
TRAY FOLEY W/METER SILVER 16FR (SET/KITS/TRAYS/PACK) IMPLANT
TRAY LAPAROSCOPIC (CUSTOM PROCEDURE TRAY) ×2 IMPLANT
TROCAR BLADELESS OPT 5 100 (ENDOMECHANICALS) ×2 IMPLANT
TROCAR XCEL 12X100 BLDLESS (ENDOMECHANICALS) ×2 IMPLANT

## 2015-07-22 NOTE — Anesthesia Procedure Notes (Signed)
Procedure Name: Intubation Date/Time: 07/22/2015 6:07 AM Performed by: Anne Fu Pre-anesthesia Checklist: Patient identified, Emergency Drugs available, Suction available, Patient being monitored and Timeout performed Patient Re-evaluated:Patient Re-evaluated prior to inductionOxygen Delivery Method: Circle system utilized Preoxygenation: Pre-oxygenation with 100% oxygen Intubation Type: IV induction Ventilation: Mask ventilation without difficulty Laryngoscope Size: Mac and 4 Grade View: Grade I Tube type: Oral Tube size: 7.5 mm Number of attempts: 1 Airway Equipment and Method: Stylet Placement Confirmation: ETT inserted through vocal cords under direct vision,  positive ETCO2,  CO2 detector and breath sounds checked- equal and bilateral Secured at: 21 cm Tube secured with: Tape Dental Injury: Teeth and Oropharynx as per pre-operative assessment

## 2015-07-22 NOTE — Anesthesia Postprocedure Evaluation (Signed)
Anesthesia Post Note  Patient: Darlene [name]  Procedure(s) Performed: Procedure(s) (LRB): APPENDECTOMY LAPAROSCOPIC (N/A)  Patient location during evaluation: PACU Anesthesia Type: General Level of consciousness: awake and alert Pain management: pain level controlled Vital Signs Assessment: post-procedure vital signs reviewed and stable Respiratory status: spontaneous breathing, nonlabored ventilation, respiratory function stable and patient connected to nasal cannula oxygen Cardiovascular status: blood pressure returned to baseline and stable Postop Assessment: no signs of nausea or vomiting Anesthetic complications: no    Last Vitals:  Filed Vitals:   07/22/15 0804 07/22/15 0815  BP: 111/58 117/71  Pulse: 92 96  Temp:    Resp: 17 15    Last Pain:  Filed Vitals:   07/22/15 0818  PainSc: 2                  FITZGERALD,W. EDMOND

## 2015-07-22 NOTE — Transfer of Care (Signed)
Immediate Anesthesia Transfer of Care Note  Patient: Darlene [name]  Procedure(s) Performed: Procedure(s): APPENDECTOMY LAPAROSCOPIC (N/A)  Patient Location: PACU  Anesthesia Type:General  Level of Consciousness:  sedated, patient cooperative and responds to stimulation  Airway & Oxygen Therapy:Patient Spontanous Breathing and Patient connected to face mask oxgen  Post-op Assessment:  Report given to PACU RN and Post -op Vital signs reviewed and stable  Post vital signs:  Reviewed and stable  Last Vitals:  Filed Vitals:   07/22/15 0255 07/22/15 0415  BP: 157/75 154/70  Pulse: 86 95  Temp:  37.2 C  Resp: 16 18    Complications: No apparent anesthesia complications

## 2015-07-22 NOTE — Op Note (Signed)
7:23 AM  07/22/2015   PATIENT:  Darlene [name]  65 y.o. female  Patient Care Team: Thressa Sheller, MD as PCP - General (Internal Medicine) Bryson Ha Himmelrich, RD as Dietitian (Bariatrics) Stark Klein, MD as Consulting Physician (General Surgery) Chauncey Cruel, MD as Consulting Physician (Oncology) Arloa Koh, MD as Consulting Physician (Radiation Oncology) Mauro Kaufmann, RN as Registered Nurse Rockwell Germany, RN as Registered Nurse Jacelyn Pi, MD as Consulting Physician (Endocrinology) Kem Boroughs, FNP as Nurse Practitioner (Family Medicine) [name], MD as Consulting Physician (Plastic Surgery) Larey Dresser, MD as Consulting Physician (Cardiology)  PRE-OPERATIVE DIAGNOSIS:  Acute Appendicitis  POST-OPERATIVE DIAGNOSIS:  ACUTE APPENDICITIS  PROCEDURE:  Procedure(s): APPENDECTOMY LAPAROSCOPIC  SURGEON:  Surgeon(s): Michael Boston, MD  ANESTHESIA:   local and general  EBL:  Total I/O In: -  Out: 10 [Blood:10]  Delay start of Pharmacological VTE agent (>24hrs) due to surgical blood loss or risk of bleeding:  no  DRAINS: none   SPECIMEN:  Source of Specimen:  APPENDIX  DISPOSITION OF SPECIMEN:  PATHOLOGY  COUNTS:  YES  PLAN OF CARE: Admit for overnight observation  PATIENT DISPOSITION:  PACU - hemodynamically stable.   INDICATIONS: Patient with concerning symptoms & work up suspicious for appendicitis.  Surgery was recommended:  The anatomy & physiology of the digestive tract was discussed.  The pathophysiology of appendicitis was discussed.  Natural history risks without surgery was discussed.   I feel the risks of no intervention will lead to serious problems that outweigh the operative risks; therefore, I recommended diagnostic laparoscopy with removal of appendix to remove the pathology.  Laparoscopic & open techniques were discussed.   I noted a good likelihood this will help address the problem.    Risks such as bleeding,  infection, abscess, leak, reoperation, possible ostomy, hernia, heart attack, death, and other risks were discussed.  Goals of post-operative recovery were discussed as well.  We will work to minimize complications.  Questions were answered.  The patient expresses understanding & wishes to proceed with surgery.  OR FINDINGS:   DESCRIPTION:   The patient was identified & brought into the operating room. The patient was positioned supine with arms tucked. SCDs were active during the entire case. The patient underwent general anesthesia without any difficulty.  The abdomen was prepped and draped in a sterile fashion. A Surgical Timeout confirmed our plan.   I made a transverse incision through the superior umbilical fold.  I made a small transverse nick through the infraumbilical fascia and confirmed peritoneal entry.  I placed a 94mm port.  We induced carbon dioxide insufflation.  Camera inspection revealed no injury.  I placed additional ports under direct laparoscopic visualization.  I mobilized the terminal ileum to proximal ascending colon in a lateral to medial fashion.  I took care to avoid injuring any retroperitoneal structures.  Patient had an inflamed short appendix densely adherent to the ascending colon mesentery.  I freed the appendix off its attachments to the ascending colon and cecal mesentery.  I elevated the appendix. I skeletonized the mesoappendix. I was able to free off the base of the appendix which was still viable.  I stapled the appendix off the cecum using a laparoscopic stapler. I took a healthy cuff of viable cecum. I ligated the mesoappendix and assured hemostasis in the mesentery.  I placed the appendix inside an EndoCatch bag and removed out the 12 mm port.  I did copious irrigation. Hemostasis was good in the  mesoappendix, colon mesentery, and retroperitoneum. Staple line was intact on the cecum with no bleeding. I washed out the pelvis, retrohepatic space and right paracolic  gutter. I washed out the left side as well.  Hemostasis is good. There was no perforation or injury.  Because the area cleaned up well after irrigation, I did not place a drain.  I reapproximated the left lower quadrant 12 mm port site with #1 PDS suture using a laparoscopic suture passer.  I aspirated the carbon dioxide. I removed the ports.  I closed skin using 4-0 monocryl stitch.  Patient was extubated and sent to the recovery room.  I discussed the operative findings with the patient's husband. I suspect the patient is going used in the hospital at least overnight and will need antibiotics overnight. Questions answered. He expressed understanding and appreciation.  Adin Hector, M.D., F.A.C.S. Gastrointestinal and Minimally Invasive Surgery [facility] [address], [place], [place] [address] [phone] Main / Paging

## 2015-07-22 NOTE — H&P (Signed)
[facility]  [address] [place], [place] [address] Phone: [phone] FAX: [place]  1950-07-06 [id number]  CARE TEAM:  PCP: Thressa Sheller, MD  Outpatient Care Team: Patient Care Team: Thressa Sheller, MD as PCP - General (Internal Medicine) Bryson Ha Himmelrich, RD as Dietitian (Bariatrics) Stark Klein, MD as Consulting Physician (General Surgery) Chauncey Cruel, MD as Consulting Physician (Oncology) Arloa Koh, MD as Consulting Physician (Radiation Oncology) Mauro Kaufmann, RN as Registered Nurse Rockwell Germany, RN as Registered Nurse Jacelyn Pi, MD as Consulting Physician (Endocrinology) Kem Boroughs, FNP as Nurse Practitioner (Family Medicine) [name], MD as Consulting Physician (Plastic Surgery) Larey Dresser, MD as Consulting Physician (Cardiology)  Inpatient Treatment Team: Treatment Team: Attending Provider: Tanna Furry, MD; Registered Nurse: Erick Colace, RN; Technician: Marlene Bast, NT; Consulting Physician: Nolon Nations, MD  This patient is a 65 y.o.female who presents today for surgical evaluation at the request of Dr Tanna Furry, [facility] ED.   Reason for evaluation: Appendicitis  Female with history of Insulin-requiring diabetes, breast cancer status post mastectomy January 2017, morbid obesity status post laparoscopic gastric bypass 2014, hypertension.  Worsening abdominal cramps throughout the day and more right-sided abdominal pain.  Low-grade fever.  Worsening nausea.  Came today with her spouse.  Examination concerning.  CT scan confirmed suspicion of acute appendicitis.  Surgical consultation requested.  Husband and bedside.  Patient noted pain with cough and car ride.  No personal nor family history of GI/colon cancer, inflammatory bowel disease, irritable bowel syndrome, allergy such as Celiac Sprue, dietary/dairy problems, colitis, ulcers nor gastritis.  No recent  sick contacts/gastroenteritis.  No travel outside the country.  No changes in diet.  No dysphagia to solids or liquids beyond is expected with gastric bypass.  No significant heartburn or reflux.  No hematochezia, hematemesis, coffee ground emesis.  No evidence of prior gastric/peptic ulceration.    Past Medical History  Diagnosis Date  . Obesity   . Hypertension   . Hyperlipidemia   . Diabetes mellitus without complication ([place])     62+ years  . Eating disorder     binge eating  . Balance problem   . Arthritis     in fingers, shoulders  . Anemia     hx of  . Depression   . Ovarian cyst rupture     possible  . Clumsiness   . Breast cancer of upper-outer quadrant of right female breast ([place]) 07/22/2014  . Breast cancer ([place])   . Anxiety   . Pneumonia   . Neuromuscular disorder ([place])     diabetic neuropathy  . Obstructive sleep apnea on CPAP     uses cpap nightly  . Heart murmur   . Radiation 04/05/15-05/19/15    right chest wall 50.4 Gy, mastectomy scar 10 Gy  . Thyroid nodule 06/2014  . Pneumonia 11/2014    Past Surgical History  Procedure Laterality Date  . Carpal tunnel release Right   . Carpal tunnel release Left   . Breath tek h pylori N/A 09/15/2012    Procedure: BREATH TEK H PYLORI;  Surgeon: Pedro Earls, MD;  Location: [facility];  Service: General;  Laterality: N/A;  . Dupuytren contracture release    . Trigger finger release      x3  . Toe surgery  1970s    bone spur  . Rotator cuff repair Right 2005  . Gastric roux-en-y  N/A 11/02/2012    Procedure: LAPAROSCOPIC ROUX-EN-Y GASTRIC;  Surgeon: Pedro Earls, MD;  Location: [facility];  Service: General;  Laterality: N/A;  . Bunionectomy Left 12/2013  . Portacath placement Left 08/02/2014    Procedure: INSERTION PORT-A-CATH;  Surgeon: Stark Klein, MD;  Location: [name];  Service: General;  Laterality: Left;  . Breast lumpectomy with radioactive seed and sentinel lymph node biopsy Right  12/27/2014    Procedure: BREAST LUMPECTOMY WITH RADIOACTIVE SEED AND SENTINEL LYMPH NODE BIOPSY;  Surgeon: Stark Klein, MD;  Location: [facility];  Service: General;  Laterality: Right;  . Breast reduction surgery Bilateral 01/06/2015    Procedure: BILATERAL BREAST REDUCTION, RIGHT ONCOPLASTIC RECONSTRUCTION),LEFT BREAST REDUCTION FOR SYMMETRY;  Surgeon: [name], MD;  Location: [facility];  Service: Plastics;  Laterality: Bilateral;  . Mastectomy Right 02/15/2015    modified  . Modified mastectomy Right 02/15/2015    Procedure: RIGHT MODIFIED RADICAL MASTECTOMY;  Surgeon: Stark Klein, MD;  Location: [facility];  Service: General;  Laterality: Right;  . Biopsy thyroid Bilateral 06/2014    2 nodules - Benign     Social History   Social History  . Marital Status: Married    Spouse Name: Darlene [name]  . Number of Children: 0  . Years of Education: Bachelor's   Occupational History  . Theatre stage manager    Social History Main Topics  . Smoking status: Never Smoker   . Smokeless tobacco: Never Used  . Alcohol Use: 0.0 oz/week    0 Standard drinks or equivalent per week     Comment: social  . Drug Use: No  . Sexual Activity: Yes    Birth Control/ Protection: Post-menopausal   Other Topics Concern  . Not on file   Social History Narrative   Patient is married Darlene [name]) and lives at home with her husband.   Patient does not have any children.   Patient is working for a [employer].   Patient has a Bachelor's degree.   Patient is right-handed.   Patient does not drink any caffeine.    Family History  Problem Relation Age of Onset  . CVA Mother   . Diabetes Father   . Heart failure Father   . Hypertension Sister   . Diabetes Brother   . Diabetes Paternal Uncle   . Breast cancer Paternal Grandmother     Current Facility-Administered Medications  Medication Dose Route Frequency Provider Last Rate Last Dose  . morphine 4 MG/ML injection 4 mg  4 mg Intravenous  Q1H PRN Tanna Furry, MD   4 mg at 07/22/15 8325   Current Outpatient Prescriptions  Medication Sig Dispense Refill  . calcium citrate-vitamin D (CITRACAL+D) 315-200 MG-UNIT tablet Take 1 tablet by mouth 3 (three) times daily.    . capecitabine (XELODA) 500 MG tablet Take 3 tablets by mouth after a meal 2 times a day seven days on, seven days off 84 tablet 4  . diphenhydrAMINE (BENADRYL) 25 MG tablet Take 25 mg by mouth daily as needed for allergies or sleep. Reported on 03/06/2015    . glucose blood (ONETOUCH VERIO) test strip 1 each by Other route as needed for other. Reported on 03/06/2015    . losartan (COZAAR) 100 MG tablet Take 1 tablet by mouth daily. Reported on 03/06/2015    . Melatonin 5 MG TABS Take 5 mg by mouth at bedtime. Reported on 03/06/2015    . metoCLOPramide (REGLAN) 5 MG tablet Take 1 tablet (5 mg  total) by mouth 3 (three) times daily before meals. 90 tablet 4  . mometasone (NASONEX) 50 MCG/ACT nasal spray Place 1-2 sprays into the nose daily as needed (for allergies.).     [name] NOVOLIN 70/30 (70-30) 100 UNIT/ML injection Inject 15 Units into the skin 2 (two) times daily with a meal. Reported on 03/06/2015    . Pediatric Multivitamins-Iron (FLINTSTONES PLUS IRON) chewable tablet Chew 1 tablet by mouth 2 (two) times daily.    . Probiotic Product ([employer] PROBIOTIC PO) Take 1 capsule by mouth daily. Reported on 03/06/2015    . Trastuzumab (HERCEPTIN IV) Inject into the vein every 21 ( twenty-one) days. Reported on 03/06/2015    . Vortioxetine HBr (TRINTELLIX) 20 MG TABS Take 20 mg by mouth daily. Reported on 03/06/2015    . acetaminophen (TYLENOL) 325 MG tablet Take 650 mg by mouth 2 (two) times daily as needed for moderate pain or headache. Reported on 04/17/2015       Allergies  Allergen Reactions  . Peanuts [Peanut Oil] Itching    "Large amounts"  . Tape Hives and Rash    ROS: Constitutional:  No fevers, chills, sweats.  Weight stable Eyes:  No vision changes, No discharge HENT:  No  sore throats, nasal drainage Lymph: No neck swelling, No bruising easily Pulmonary:  No cough, productive sputum CV: No orthopnea, PND  Patient walks 20 minutes for about 1 miles without difficulty.  No exertional chest/neck/shoulder/arm pain. GI:  No personal nor family history of GI/colon cancer, inflammatory bowel disease, irritable bowel syndrome, allergy such as Celiac Sprue, dietary/dairy problems, colitis, ulcers nor gastritis.  No recent sick contacts/gastroenteritis.  No travel outside the country.  No changes in diet. Renal: No UTIs, No hematuria Genital:  No drainage, bleeding, masses Musculoskeletal: No severe joint pain.  Good ROM major joints Skin:  No sores or lesions.  No rashes Heme/Lymph:  No easy bleeding.  No swollen lymph nodes Neuro: No focal weakness/numbness.  No seizures Psych: No suicidal ideation.  No hallucinations  BP 170/59 mmHg  Pulse 85  Temp(Src) 99 F (37.2 C) (Oral)  Resp 18  Ht _0  (1.753 m)  Wt 94.802 kg (209 lb)  BMI 30.85 kg/m2  SpO2 91%  LMP 10/22/2007  Physical Exam: General: Pt awake/alert/oriented x4 in no major acute distress Eyes: PERRL, normal EOM. Sclera nonicteric Neuro: CN II-XII intact w/o focal sensory/motor deficits. Lymph: No head/neck/groin lymphadenopathy Psych:  No delerium/psychosis/paranoia HENT: Normocephalic, Mucus membranes moist.  No thrush Neck: Supple, No tracheal deviation Chest: No pain.  Good respiratory excursion. CV:  Pulses intact.  Regular rhythm Abdomen: Soft, obese Nondistended.  Were not lower quadrant abdominal pain with cough and bed shake.  Tenderness over McBurney's point, slightly lateral.  Voluntary guarding.  Rest the abdomen is nontender.  No incarcerated hernias. Genital: Normal external genitalia.  No inguinal hernias.  No lymphadenopathy. Ext:  SCDs BLE.  No significant edema.  No cyanosis Skin: No petechiae / purpurea.  No major sores Musculoskeletal: No severe joint pain.  Good ROM major  joints   Results:   Labs: Results for orders placed or performed during the hospital encounter of 07/21/15 (from the past 48 hour(s))  Urinalysis, Routine w reflex microscopic (not at [facility])     Status: Abnormal   Collection Time: 07/21/15 11:28 PM  Result Value Ref Range   Color, Urine YELLOW YELLOW   APPearance CLEAR CLEAR   Specific Gravity, Urine 1.020 1.005 - 1.030   pH 6.0 5.0 -  8.0   Glucose, UA NEGATIVE NEGATIVE mg/dL   Hgb urine dipstick TRACE (A) NEGATIVE   Bilirubin Urine NEGATIVE NEGATIVE   Ketones, ur NEGATIVE NEGATIVE mg/dL   Protein, ur 30 (A) NEGATIVE mg/dL   Nitrite NEGATIVE NEGATIVE   Leukocytes, UA NEGATIVE NEGATIVE  Urine microscopic-add on     Status: Abnormal   Collection Time: 07/21/15 11:28 PM  Result Value Ref Range   Squamous Epithelial / LPF 0-5 (A) NONE SEEN   WBC, UA NONE SEEN 0 - 5 WBC/hpf   RBC / HPF 0-5 0 - 5 RBC/hpf   Bacteria, UA RARE (A) NONE SEEN  CBC with Differential/Platelet     Status: Abnormal   Collection Time: 07/21/15 11:37 PM  Result Value Ref Range   WBC 9.2 4.0 - 10.5 K/uL   RBC 2.93 (L) 3.87 - 5.11 MIL/uL   Hemoglobin 9.7 (L) 12.0 - 15.0 g/dL   HCT 27.4 (L) 36.0 - 46.0 %   MCV 93.5 78.0 - 100.0 fL   MCH 33.1 26.0 - 34.0 pg   MCHC 35.4 30.0 - 36.0 g/dL   RDW 15.0 11.5 - 15.5 %   Platelets 194 150 - 400 K/uL   Neutrophils Relative % 78 %   Neutro Abs 7.2 1.7 - 7.7 K/uL   Lymphocytes Relative 11 %   Lymphs Abs 1.0 0.7 - 4.0 K/uL   Monocytes Relative 10 %   Monocytes Absolute 0.9 0.1 - 1.0 K/uL   Eosinophils Relative 1 %   Eosinophils Absolute 0.1 0.0 - 0.7 K/uL   Basophils Relative 0 %   Basophils Absolute 0.0 0.0 - 0.1 K/uL  Comprehensive metabolic panel     Status: Abnormal   Collection Time: 07/21/15 11:37 PM  Result Value Ref Range   Sodium 138 135 - 145 mmol/L   Potassium 3.7 3.5 - 5.1 mmol/L   Chloride 107 101 - 111 mmol/L   CO2 24 22 - 32 mmol/L   Glucose, Bld 177 (H) 65 - 99 mg/dL   BUN 31 (H) 6 - 20 mg/dL    Creatinine, Ser 1.08 (H) 0.44 - 1.00 mg/dL   Calcium 9.0 8.9 - 10.3 mg/dL   Total Protein 6.6 6.5 - 8.1 g/dL   Albumin 3.9 3.5 - 5.0 g/dL   AST 19 15 - 41 U/L   ALT 17 14 - 54 U/L   Alkaline Phosphatase 78 38 - 126 U/L   Total Bilirubin 1.4 (H) 0.3 - 1.2 mg/dL   GFR calc non Af Amer 54 (L) >60 mL/min   GFR calc Af Amer >60 >60 mL/min    Comment: (NOTE) The eGFR has been calculated using the CKD EPI equation. This calculation has not been validated in all clinical situations. eGFR's persistently <60 mL/min signify possible Chronic Kidney Disease.    Anion gap 7 5 - 15    Imaging / Studies: Ct Abdomen Pelvis W Contrast  07/22/2015  CLINICAL DATA:  65 year old female with right-sided abdominal pain and fever. EXAM: CT ABDOMEN AND PELVIS WITH CONTRAST TECHNIQUE: Multidetector CT imaging of the abdomen and pelvis was performed using the standard protocol following bolus administration of intravenous contrast. CONTRAST:  18m ISOVUE-300 IOPAMIDOL (ISOVUE-300) INJECTION 61% COMPARISON:  And right upper quadrant ultrasound dated 05/10/2015 FINDINGS: The visualized lung bases are clear. There is coronary vascular calcification. No intra-abdominal free air or free fluid. There is mild irregularity of the hepatic contour. Clinical correlation is recommended to evaluate for underlying mild cirrhosis. Multiple stones noted within the  gallbladder. No pericholecystic fluid. The pancreas, spleen, and the right adrenal gland appear unremarkable. Mild left adrenal nodularity versus hyperplasia. There is a 6 mm nonobstructing left renal inferior pole calculus. No hydronephrosis. The right kidney appears unremarkable. The visualized ureters and urinary bladder appear unremarkable. The uterus is anteverted. Small calcified uterine fibroids noted. The visualized ovaries appear unremarkable. There is postsurgical changes of gastric bypass. There is haziness and stranding of the fat adjacent to the gastrojejunostomy  in the left upper abdomen. No evidence bowel obstruction. There scattered colonic diverticula without active inflammatory changes. The appendix is enlarged and inflamed. It is located in the right lower quadrant lateral to the cecum. High attenuating content within the appendix may represent appendicolith versus oral contrast. There is no evidence of perforation. No abscess. There is mild aortoiliac atherosclerotic disease. There is atherosclerotic calcification of the mesentery vasculature. No portal venous gas identified. Multiple top-normal lymph nodes noted in the left upper abdomen, likely reactive. There is no adenopathy. The abdominal wall soft tissues appear unremarkable. There is degenerative changes of the spine. No acute fracture. IMPRESSION: Acute appendicitis.  No evidence of perforation or abscess. Postsurgical changes of gastric bypass with mild haziness of the fat at the gastric jejunostomy may represent mild inflammation. Multiple top-normal reactive lymph nodes noted. Clinical correlation is recommended. Electronically Signed   By: Anner Crete M.D.   On: 07/22/2015 01:33   US Thyroid Biopsy  06/28/2015  INDICATION: Indeterminate thyroid nodules Right Mid Pole 1.9 cm x 1.2 cm x 1.9 cm Left Lower Pole 2.8 cm x 1.7 cm x 2.3 cm EXAM: ULTRASOUND GUIDED THYROID FINE NEEDLE ASPIRATION x2 COMPARISON:  US Thyroid 05/31/2015 MEDICATIONS: 5 cc 1% lidocaine x 2 COMPLICATIONS: None immediate. TECHNIQUE: Informed written consent was obtained from the patient after a discussion of the risks, benefits and alternatives to treatment. Questions regarding the procedure were encouraged and answered. A timeout was performed prior to the initiation of the procedure. Pre-procedural ultrasound scanning demonstrated Bilateral thyroid nodules The procedures were planned. The neck was prepped in the usual sterile fashion, and a sterile drape was applied covering the operative field. A timeout was performed prior to the  initiation of the procedure. Local anesthesia was provided with 1% lidocaine. Under direct ultrasound guidance, 3 FNA biopsies were performed of the right with a 25 gauge needle. 2 FNA biopsies were also performed of Right thyroid nodule for AFFIRMA. 5 total samples were prepared and submitted to pathology. Under direct ultrasound guidance, 3 FNA biopsies were performed of the left with a 25 gauge needle. 1 cc colloid material collected and submitted. 2 FNA biopsies were also performed of Left thyroid nodule for AFFIRMA. 5 total samples were prepared and submitted to pathology. Limited post procedural scanning was negative for hematoma or additional complication. Dressings were placed. The patient tolerated the above procedures procedure well without immediate postprocedural complication. IMPRESSION: Technically successful ultrasound guided fine needle aspiration of Bilateral thyroid nodules. Read by:  Lavonia Drafts [facility] Electronically Signed   By: Sandi Mariscal M.D.   On: 06/28/2015 09:03   US Thyroid Biopsy  06/28/2015  INDICATION: Indeterminate thyroid nodules Right Mid Pole 1.9 cm x 1.2 cm x 1.9 cm Left Lower Pole 2.8 cm x 1.7 cm x 2.3 cm EXAM: ULTRASOUND GUIDED THYROID FINE NEEDLE ASPIRATION x2 COMPARISON:  US Thyroid 05/31/2015 MEDICATIONS: 5 cc 1% lidocaine x 2 COMPLICATIONS: None immediate. TECHNIQUE: Informed written consent was obtained from the patient after a discussion of the risks, benefits  and alternatives to treatment. Questions regarding the procedure were encouraged and answered. A timeout was performed prior to the initiation of the procedure. Pre-procedural ultrasound scanning demonstrated Bilateral thyroid nodules The procedures were planned. The neck was prepped in the usual sterile fashion, and a sterile drape was applied covering the operative field. A timeout was performed prior to the initiation of the procedure. Local anesthesia was provided with 1% lidocaine. Under direct ultrasound  guidance, 3 FNA biopsies were performed of the right with a 25 gauge needle. 2 FNA biopsies were also performed of Right thyroid nodule for AFFIRMA. 5 total samples were prepared and submitted to pathology. Under direct ultrasound guidance, 3 FNA biopsies were performed of the left with a 25 gauge needle. 1 cc colloid material collected and submitted. 2 FNA biopsies were also performed of Left thyroid nodule for AFFIRMA. 5 total samples were prepared and submitted to pathology. Limited post procedural scanning was negative for hematoma or additional complication. Dressings were placed. The patient tolerated the above procedures procedure well without immediate postprocedural complication. IMPRESSION: Technically successful ultrasound guided fine needle aspiration of Bilateral thyroid nodules. Read by:  Lavonia Drafts [facility] Electronically Signed   By: Sandi Mariscal M.D.   On: 06/28/2015 09:03    Medications / Allergies: per chart  Antibiotics: Anti-infectives    Start     Dose/Rate Route Frequency Ordered Stop   07/22/15 0215  piperacillin-tazobactam (ZOSYN) IVPB 3.375 g  Status:  Discontinued     3.375 g 12.5 mL/hr over 240 Minutes Intravenous  Once 07/22/15 0203 07/22/15 0209      Assessment  Darlene [name]  66 y.o. female       Problem List:  Principal Problem:   Acute appendicitis Active Problems:   Lap Roux Y Gastric Bypass Oct 2014   OSA on CPAP   Insulin-requiring or dependent type II diabetes mellitus ([place])   Classic history physical and CT scan findings for acute appendicitis.  Seems early by history and CAT scan, so hopefully not perforated  Plan:  -Admit.  IV fluids.  Glucose control.  Diagnostic laparoscopy with appendectomy:  The anatomy & physiology of the digestive tract was discussed.  The pathophysiology of appendicitis and other appendiceal disorders were discussed.  Natural history risks without surgery was discussed.   I feel the risks of no intervention  will lead to serious problems that outweigh the operative risks; therefore, I recommended diagnostic laparoscopy with removal of appendix to remove the pathology.  Laparoscopic & open techniques were discussed.   I noted a good likelihood this will help address the problem.   Risks such as bleeding, infection, abscess, leak, reoperation, injury to other organs, need for repair of tissues / organs, possible ostomy, hernia, heart attack, stroke, death, and other risks were discussed.  Goals of post-operative recovery were discussed as well.  We will work to minimize complications.  Questions were answered.  The patient expresses understanding & wishes to proceed with surgery.   -VTE prophylaxis- SCDs, etc -mobilize as tolerated to help recovery    Adin Hector, M.D., F.A.C.S. Gastrointestinal and Minimally Invasive Surgery [facility] [address] [place], [place] [address] [phone] Main / Paging   07/22/2015  Note: Portions of this report may have been transcribed using voice recognition software. Every effort was made to ensure accuracy; however, inadvertent computerized transcription errors may be present.   Any transcriptional errors that result from this process are unintentional.

## 2015-07-22 NOTE — ED Notes (Signed)
Patient transported to CT 

## 2015-07-22 NOTE — Anesthesia Preprocedure Evaluation (Addendum)
Anesthesia Evaluation  Patient identified by MRN, date of birth, ID band Patient awake    Reviewed: Allergy & Precautions, NPO status , Patient's Chart, lab work & pertinent test results  Airway Mallampati: I  TM Distance: >3 FB Neck ROM: Full    Dental  (+) Teeth Intact, Dental Advisory Given   Pulmonary sleep apnea and Continuous Positive Airway Pressure Ventilation ,    breath sounds clear to auscultation       Cardiovascular hypertension, Pt. on medications  Rhythm:Regular Rate:Normal     Neuro/Psych    GI/Hepatic   Endo/Other  diabetes, Well Controlled, Type 2, Insulin DependentMorbid obesity  Renal/GU      Musculoskeletal   Abdominal   Peds  Hematology   Anesthesia Other Findings   Reproductive/Obstetrics                           Anesthesia Physical Anesthesia Plan  ASA: III  Anesthesia Plan: General   Post-op Pain Management:    Induction: Intravenous, Rapid sequence and Cricoid pressure planned  Airway Management Planned: Oral ETT  Additional Equipment:   Intra-op Plan:   Post-operative Plan: Extubation in OR  Informed Consent: I have reviewed the patients History and Physical, chart, labs and discussed the procedure including the risks, benefits and alternatives for the proposed anesthesia with the patient or authorized representative who has indicated his/her understanding and acceptance.   Dental advisory given  Plan Discussed with: CRNA, Anesthesiologist and Surgeon  Anesthesia Plan Comments:         Anesthesia Quick Evaluation

## 2015-07-23 ENCOUNTER — Encounter (HOSPITAL_COMMUNITY): Payer: Self-pay | Admitting: Surgery

## 2015-07-23 LAB — GLUCOSE, CAPILLARY
Glucose-Capillary: 149 mg/dL — ABNORMAL HIGH (ref 65–99)
Glucose-Capillary: 167 mg/dL — ABNORMAL HIGH (ref 65–99)

## 2015-07-23 MED ORDER — HEPARIN SOD (PORK) LOCK FLUSH 100 UNIT/ML IV SOLN
500.0000 [IU] | INTRAVENOUS | Status: AC | PRN
  Administered 2015-07-23: 500 [IU]

## 2015-07-23 MED ORDER — AMOXICILLIN-POT CLAVULANATE 875-125 MG PO TABS
1.0000 | ORAL_TABLET | Freq: Two times a day (BID) | ORAL | Status: DC
  Administered 2015-07-23: 1 via ORAL
  Filled 2015-07-23: qty 1

## 2015-07-23 MED ORDER — AMOXICILLIN-POT CLAVULANATE 875-125 MG PO TABS: 1.0000 | ORAL_TABLET | Freq: Two times a day (BID) | ORAL | Status: DC

## 2015-07-23 MED ORDER — SODIUM CHLORIDE 0.9% FLUSH
10.0000 mL | INTRAVENOUS | Status: DC | PRN
  Administered 2015-07-23: 10 mL
  Filled 2015-07-23: qty 40

## 2015-07-23 NOTE — Discharge Summary (Signed)
Physician Discharge Summary  Patient ID: Darlene [name] MRN: [id number] DOB/AGE: [birth year] 65 y.o.  Admit date: 07/21/2015 Discharge date: 07/23/2015  Admission Diagnoses: Acute appendicitis  Discharge Diagnoses:  Principal Problem:   Acute appendicitis Active Problems:   Lap Roux Y Gastric Bypass Oct 2014   OSA on CPAP   Insulin-requiring or dependent type II diabetes mellitus ([place])   Appendicitis with peritonitis   Discharged Condition: good  Hospital Course: Pt admitted after laparoscopic appendectomy.  She recovered on the floor.  Her diet was advanced.  By her d/c she was tolerating a diet and her pain was controlled.  She was ambulating without difficulty.    Consults: None  Significant Diagnostic Studies: labs: cbc, chemistry  Treatments: IV hydration, antibiotics: ceftriaxone and metronidazole, analgesia: acetaminophen, insulin: regular and surgery: lap appendectomy  Discharge Exam: Blood pressure 153/65, pulse 84, temperature 99.9 F (37.7 C), temperature source Oral, resp. rate 17, height 5\' 9"  (1.753 m), weight 94.802 kg (209 lb), last menstrual period 10/22/2007, SpO2 96 %. General appearance: alert and cooperative GI: soft, appropriately tender Incision/Wound: clean, small amt of drainage on umbilical incision dressing  Disposition: 01-Home or Self Care      Discharge Instructions    Call MD for:  extreme fatigue    Complete by:  As directed      Call MD for:  hives    Complete by:  As directed      Call MD for:  persistant nausea and vomiting    Complete by:  As directed      Call MD for:  redness, tenderness, or signs of infection (pain, swelling, redness, odor or green/yellow discharge around incision site)    Complete by:  As directed      Call MD for:  severe uncontrolled pain    Complete by:  As directed      Call MD for:    Complete by:  As directed   Temperature > 101.22F     Diet - low sodium heart healthy    Complete by:  As directed   Start with bland, low residue diet for a few days, then advance to a heart healthy (low fat, high fiber) diet.  If you feel nauseated or constipated, simplify to a liquid only diet for 48 hours until you are feeling better (no more nausea, farting/passing gas, having a bowel movement, etc...).  If you cannot tolerate even drinking liquids, or feeling worse, let your surgeon know or go to the Emergency Department for help.     Discharge instructions    Complete by:  As directed   Please see discharge instruction sheets.   Also refer to any handouts/printouts that may have been given from the [facility] surgery office (if you visited [name] there before surgery) Please call our office if you have any questions or concerns [phone]     Discharge wound care:    Complete by:  As directed   If you have closed incisions: Shower and bathe over these incisions with soap and water every day.  It is OK to wash over the dressings: they are waterproof. Remove all surgical dressings on postoperative day #3.  You do not need to replace dressings over the closed incisions unless you feel more comfortable with a Band-Aid covering it.   If you have an open wound: That requires packing, so please see wound care instructions.   In general, remove all dressings, wash wound with soap and water and then replace  with saline moistened gauze.  Do the dressing change at least every day.    Please call our office [phone] if you have further questions.     Driving Restrictions    Complete by:  As directed   No driving until off narcotics and can safely swerve away without pain during an emergency     Increase activity slowly    Complete by:  As directed      Lifting restrictions    Complete by:  As directed   Avoid heavy lifting initially, <20 pounds at first.   Do not push through pain.   You have no specific weight limit: If it hurts to do, DON'T DO IT.    If you feel no pain, you are not injuring anything.  Pain  will protect you from injury.   Coughing and sneezing are far more stressful to your incision than any lifting.   Avoid resuming heavy lifting (>50 pounds) or other intense activity until off all narcotic pain medications.   When want to exercise more, give yourself 2 weeks to gradually get back to full intense exercise/activity.     May shower / Bathe    Complete by:  As directed   [place].  It is fine for dressings or wounds to be washed/rinsed.  Use gentle soap & water.  This will help the incisions and/or wounds get clean & minimize infection.     May walk up steps    Complete by:  As directed      Sexual Activity Restrictions    Complete by:  As directed   Sexual activity as tolerated.  Do not push through pain.  Pain will protect you from injury.     Walk with assistance    Complete by:  As directed   Walk over an hour a day.  May use a walker/cane/companion to help with balance and stamina.            Medication List    TAKE these medications        acetaminophen 325 MG tablet  Commonly known as:  TYLENOL  Take 650 mg by mouth 2 (two) times daily as needed for moderate pain or headache. Reported on 04/17/2015     amoxicillin-clavulanate 875-125 MG tablet  Commonly known as:  AUGMENTIN  Take 1 tablet by mouth every 12 (twelve) hours.     calcium citrate-vitamin D 315-200 MG-UNIT tablet  Commonly known as:  CITRACAL+D  Take 1 tablet by mouth 3 (three) times daily.     capecitabine 500 MG tablet  Commonly known as:  XELODA  Take 3 tablets by mouth after a meal 2 times a day seven days on, seven days off     [facility] PROBIOTIC PO  Take 1 capsule by mouth daily. Reported on 03/06/2015     diphenhydrAMINE 25 MG tablet  Commonly known as:  BENADRYL  Take 25 mg by mouth daily as needed for allergies or sleep. Reported on 03/06/2015     FLINTSTONES PLUS IRON chewable tablet  Chew 1 tablet by mouth 2 (two) times daily.     HERCEPTIN IV  Inject into the vein every 21 (  twenty-one) days. Reported on 03/06/2015     losartan 100 MG tablet  Commonly known as:  COZAAR  Take 1 tablet by mouth daily. Reported on 03/06/2015     Melatonin 5 MG Tabs  Take 5 mg by mouth at bedtime. Reported on 03/06/2015  metoCLOPramide 5 MG tablet  Commonly known as:  REGLAN  Take 1 tablet (5 mg total) by mouth 3 (three) times daily before meals.     NASONEX 50 MCG/ACT nasal spray  Generic drug:  mometasone  Place 1-2 sprays into the nose daily as needed (for allergies.).     NOVOLIN 70/30 (70-30) 100 UNIT/ML injection  Generic drug:  insulin NPH-regular Human  Inject 15 Units into the skin 2 (two) times daily with a meal. Reported on 03/06/2015     [facility] VERIO test strip  Generic drug:  glucose blood  1 each by Other route as needed for other. Reported on 03/06/2015     oxyCODONE 5 MG immediate release tablet  Commonly known as:  Oxy IR/ROXICODONE  Take 1-2 tablets (5-10 mg total) by mouth every 4 (four) hours as needed for moderate pain, severe pain or breakthrough pain.     TRINTELLIX 20 MG Tabs  Generic drug:  vortioxetine HBr  Take 20 mg by mouth daily. Reported on 03/06/2015       Follow-up Information    Follow up with [facility]. Schedule an appointment as soon as possible for a visit in 3 weeks.   Specialty:  General Surgery   Why:  To follow up after your operation, To follow up after your hospital stay   Contact information:   [address] [place] [phone]      Signed: Rosario Adie [id number], [id number] AM

## 2015-07-23 NOTE — Discharge Instructions (Signed)
LAPAROSCOPIC SURGERY: POST OP INSTRUCTIONS ° °###################################################################### ° °EAT °Gradually transition to a high fiber diet with a fiber supplement over the next few weeks after discharge.  Start with a pureed / full liquid diet (see below) ° °WALK °Walk an hour a day.  Control your pain to do that.   ° °CONTROL PAIN °Control pain so that you can walk, sleep, tolerate sneezing/coughing, go up/down stairs. ° °HAVE A BOWEL MOVEMENT DAILY °Keep your bowels regular to avoid problems.  OK to try a laxative to override constipation.  OK to use an antidairrheal to slow down diarrhea.  Call if not better after 2 tries ° °CALL IF YOU HAVE PROBLEMS/CONCERNS °Call if you are still struggling despite following these instructions. °Call if you have concerns not answered by these instructions ° °###################################################################### ° ° ° °1. DIET: Follow a light bland diet the first 24 hours after arrival home, such as soup, liquids, crackers, etc.  Be sure to include lots of fluids daily.  Avoid fast food or heavy meals as your are more likely to get nauseated.  Eat a low fat the next few days after surgery.   °2. Take your usually prescribed home medications unless otherwise directed. °3. PAIN CONTROL: °a. Pain is best controlled by a usual combination of three different methods TOGETHER: °i. Ice/Heat °ii. Over the counter pain medication °iii. Prescription pain medication °b. Most patients will experience some swelling and bruising around the incisions.  Ice packs or heating pads (30-60 minutes up to 6 times a day) will help. Use ice for the first few days to help decrease swelling and bruising, then switch to heat to help relax tight/sore spots and speed recovery.  Some people prefer to use ice alone, heat alone, alternating between ice & heat.  Experiment to what works for you.  Swelling and bruising can take several weeks to resolve.   °c. It is  helpful to take an over-the-counter pain medication regularly for the first few weeks.  Choose one of the following that works best for you: °i. Naproxen (Aleve, etc)  Two 220mg tabs twice a day °ii. Ibuprofen (Advil, etc) Three 200mg tabs four times a day (every meal & bedtime) °iii. Acetaminophen (Tylenol, etc) 500-650mg four times a day (every meal & bedtime) °d. A  prescription for pain medication (such as oxycodone, hydrocodone, etc) should be given to you upon discharge.  Take your pain medication as prescribed.  °i. If you are having problems/concerns with the prescription medicine (does not control pain, nausea, vomiting, rash, itching, etc), please call us [phone] to see if we need to switch you to a different pain medicine that will work better for you and/or control your side effect better. °ii. If you need a refill on your pain medication, please contact your pharmacy.  They will contact our office to request authorization. Prescriptions will not be filled after 5 pm or on week-ends. °4. Avoid getting constipated.  Between the surgery and the pain medications, it is common to experience some constipation.  Increasing fluid intake and taking a fiber supplement (such as Metamucil, Citrucel, FiberCon, MiraLax, etc) 1-2 times a day regularly will usually help prevent this problem from occurring.  A mild laxative (prune juice, Milk of Magnesia, MiraLax, etc) should be taken according to package directions if there are no bowel movements after 48 hours.   °5. Watch out for diarrhea.  If you have many loose bowel movements, simplify your diet to bland foods & liquids for   a few days.  Stop any stool softeners and decrease your fiber supplement.  Switching to mild anti-diarrheal medications (Kayopectate, Pepto Bismol) can help.  If this worsens or does not improve, please call us. °6. Wash / shower every day.  You may shower over the dressings as they are waterproof.  Continue to shower over incision(s)  after the dressing is off. °7. Remove your waterproof bandages 5 days after surgery.  You may leave the incision open to air.  You may replace a dressing/Band-Aid to cover the incision for comfort if you wish.  °8. ACTIVITIES as tolerated:   °a. You may resume regular (light) daily activities beginning the next day--such as daily self-care, walking, climbing stairs--gradually increasing activities as tolerated.  If you can walk 30 minutes without difficulty, it is safe to try more intense activity such as jogging, treadmill, bicycling, low-impact aerobics, swimming, etc. °b. Save the most intensive and strenuous activity for last such as sit-ups, heavy lifting, contact sports, etc  Refrain from any heavy lifting or straining until you are off narcotics for pain control.   °c. DO NOT PUSH THROUGH PAIN.  Let pain be your guide: If it hurts to do something, don't do it.  Pain is your body warning you to avoid that activity for another week until the pain goes down. °d. You may drive when you are no longer taking prescription pain medication, you can comfortably wear a seatbelt, and you can safely maneuver your car and apply brakes. °e. You may have sexual intercourse when it is comfortable.  °9. FOLLOW UP in our office °a. Please call [facility] at [phone] to set up an appointment to see your surgeon in the office for a follow-up appointment approximately 2-3 weeks after your surgery. °b. Make sure that you call for this appointment the day you arrive home to insure a convenient appointment time. °10. IF YOU HAVE DISABILITY OR FAMILY LEAVE FORMS, BRING THEM TO THE OFFICE FOR PROCESSING.  DO NOT GIVE THEM TO YOUR DOCTOR. ° ° °WHEN TO CALL US [phone]: °1. Poor pain control °2. Reactions / problems with new medications (rash/itching, nausea, etc)  °3. Fever over 101.5 F (38.5 C) °4. Inability to urinate °5. Nausea and/or vomiting °6. Worsening swelling or bruising °7. Continued bleeding from incision. °8. Increased  pain, redness, or drainage from the incision ° ° The clinic staff is available to answer your questions during regular business hours (8:30am-5pm).  Please don’t hesitate to call and ask to speak to one of our nurses for clinical concerns.  ° If you have a medical emergency, go to the nearest emergency room or call 911. ° A surgeon from [facility] is always on call at the hospitals ° ° °[facility] °[address], [address], [place], [place]  [address] ? °MAIN: [phone] ? TOLL FREE: [phone] ?  °FAX [phone] °[email or web address] ° ° °

## 2015-07-23 NOTE — Progress Notes (Signed)
Dr. Johney Maine aware via phone pt tolerated regular diet at lunch. Pt has had low grade temp this am and is currently 99.2. Pt denies pain, ambulating independently, voiding and passing flatus. MD instructed to proceed with discharge home.

## 2015-07-23 NOTE — Progress Notes (Signed)
Assessment unchanged. Pt verbalized understanding of dc instructions through teach back including follow up care and when to call the doctor. Scripts given as provided by MD. Discharged via wc accompanied by NT and husband.

## 2015-07-24 ENCOUNTER — Other Ambulatory Visit (HOSPITAL_BASED_OUTPATIENT_CLINIC_OR_DEPARTMENT_OTHER): Payer: BLUE CROSS/BLUE SHIELD

## 2015-07-24 DIAGNOSIS — C50411 Malignant neoplasm of upper-outer quadrant of right female breast: Secondary | ICD-10-CM | POA: Diagnosis not present

## 2015-07-24 LAB — CBC WITH DIFFERENTIAL/PLATELET
BASO%: 0.2 % (ref 0.0–2.0)
Basophils Absolute: 0 10*3/uL (ref 0.0–0.1)
EOS%: 1.8 % (ref 0.0–7.0)
Eosinophils Absolute: 0.1 10*3/uL (ref 0.0–0.5)
HCT: 29 % — ABNORMAL LOW (ref 34.8–46.6)
HGB: 9.8 g/dL — ABNORMAL LOW (ref 11.6–15.9)
LYMPH%: 13.9 % — ABNORMAL LOW (ref 14.0–49.7)
MCH: 33.7 pg (ref 25.1–34.0)
MCHC: 33.8 g/dL (ref 31.5–36.0)
MCV: 99.8 fL (ref 79.5–101.0)
MONO#: 0.6 10*3/uL (ref 0.1–0.9)
MONO%: 9.2 % (ref 0.0–14.0)
NEUT#: 5 10*3/uL (ref 1.5–6.5)
NEUT%: 74.9 % (ref 38.4–76.8)
Platelets: 181 10*3/uL (ref 145–400)
RBC: 2.91 10*6/uL — ABNORMAL LOW (ref 3.70–5.45)
RDW: 16 % — ABNORMAL HIGH (ref 11.2–14.5)
WBC: 6.6 10*3/uL (ref 3.9–10.3)
lymph#: 0.9 10*3/uL (ref 0.9–3.3)

## 2015-07-24 LAB — COMPREHENSIVE METABOLIC PANEL
ALT: 16 U/L (ref 0–55)
AST: 18 U/L (ref 5–34)
Albumin: 3.1 g/dL — ABNORMAL LOW (ref 3.5–5.0)
Alkaline Phosphatase: 83 U/L (ref 40–150)
Anion Gap: 7 mEq/L (ref 3–11)
BUN: 19.8 mg/dL (ref 7.0–26.0)
CO2: 25 mEq/L (ref 22–29)
Calcium: 8.8 mg/dL (ref 8.4–10.4)
Chloride: 108 mEq/L (ref 98–109)
Creatinine: 1.2 mg/dL — ABNORMAL HIGH (ref 0.6–1.1)
EGFR: 49 mL/min/{1.73_m2} — ABNORMAL LOW (ref >90)
Glucose: 219 mg/dl — ABNORMAL HIGH (ref 70–140)
Potassium: 4.1 mEq/L (ref 3.5–5.1)
Sodium: 141 mEq/L (ref 136–145)
Total Bilirubin: 0.74 mg/dL (ref 0.20–1.20)
Total Protein: 6.2 g/dL — ABNORMAL LOW (ref 6.4–8.3)

## 2015-07-31 ENCOUNTER — Other Ambulatory Visit (HOSPITAL_BASED_OUTPATIENT_CLINIC_OR_DEPARTMENT_OTHER): Payer: BLUE CROSS/BLUE SHIELD

## 2015-07-31 ENCOUNTER — Ambulatory Visit (HOSPITAL_BASED_OUTPATIENT_CLINIC_OR_DEPARTMENT_OTHER): Payer: BLUE CROSS/BLUE SHIELD

## 2015-07-31 ENCOUNTER — Ambulatory Visit: Payer: BLUE CROSS/BLUE SHIELD

## 2015-07-31 VITALS — BP 151/53 | HR 53 | Temp 98.0°F | Resp 18

## 2015-07-31 DIAGNOSIS — C50411 Malignant neoplasm of upper-outer quadrant of right female breast: Secondary | ICD-10-CM

## 2015-07-31 DIAGNOSIS — Z95828 Presence of other vascular implants and grafts: Secondary | ICD-10-CM

## 2015-07-31 DIAGNOSIS — Z5112 Encounter for antineoplastic immunotherapy: Secondary | ICD-10-CM | POA: Diagnosis not present

## 2015-07-31 LAB — CBC WITH DIFFERENTIAL/PLATELET
BASO%: 0.4 % (ref 0.0–2.0)
Basophils Absolute: 0 10*3/uL (ref 0.0–0.1)
EOS%: 1.6 % (ref 0.0–7.0)
Eosinophils Absolute: 0.1 10*3/uL (ref 0.0–0.5)
HCT: 27.3 % — ABNORMAL LOW (ref 34.8–46.6)
HGB: 9.4 g/dL — ABNORMAL LOW (ref 11.6–15.9)
LYMPH%: 15.1 % (ref 14.0–49.7)
MCH: 33.6 pg (ref 25.1–34.0)
MCHC: 34.5 g/dL (ref 31.5–36.0)
MCV: 97.4 fL (ref 79.5–101.0)
MONO#: 0.5 10*3/uL (ref 0.1–0.9)
MONO%: 7.3 % (ref 0.0–14.0)
NEUT#: 5.5 10*3/uL (ref 1.5–6.5)
NEUT%: 75.6 % (ref 38.4–76.8)
Platelets: 213 10*3/uL (ref 145–400)
RBC: 2.8 10*6/uL — ABNORMAL LOW (ref 3.70–5.45)
RDW: 16.2 % — ABNORMAL HIGH (ref 11.2–14.5)
WBC: 7.3 10*3/uL (ref 3.9–10.3)
lymph#: 1.1 10*3/uL (ref 0.9–3.3)

## 2015-07-31 LAB — COMPREHENSIVE METABOLIC PANEL
ALT: 21 U/L (ref 0–55)
AST: 25 U/L (ref 5–34)
Albumin: 3.2 g/dL — ABNORMAL LOW (ref 3.5–5.0)
Alkaline Phosphatase: 71 U/L (ref 40–150)
Anion Gap: 9 mEq/L (ref 3–11)
BUN: 19.4 mg/dL (ref 7.0–26.0)
CO2: 21 mEq/L — ABNORMAL LOW (ref 22–29)
Calcium: 9 mg/dL (ref 8.4–10.4)
Chloride: 113 mEq/L — ABNORMAL HIGH (ref 98–109)
Creatinine: 1.1 mg/dL (ref 0.6–1.1)
EGFR: 56 mL/min/{1.73_m2} — ABNORMAL LOW (ref >90)
Glucose: 134 mg/dl (ref 70–140)
Potassium: 3.8 mEq/L (ref 3.5–5.1)
Sodium: 143 mEq/L (ref 136–145)
Total Bilirubin: 0.81 mg/dL (ref 0.20–1.20)
Total Protein: 6.1 g/dL — ABNORMAL LOW (ref 6.4–8.3)

## 2015-07-31 MED ORDER — SODIUM CHLORIDE 0.9 % IV SOLN
Freq: Once | INTRAVENOUS | Status: AC
  Administered 2015-07-31: 10:00:00 via INTRAVENOUS

## 2015-07-31 MED ORDER — DIPHENHYDRAMINE HCL 25 MG PO CAPS
ORAL_CAPSULE | ORAL | Status: AC
  Filled 2015-07-31: qty 1

## 2015-07-31 MED ORDER — HEPARIN SOD (PORK) LOCK FLUSH 100 UNIT/ML IV SOLN
500.0000 [IU] | Freq: Once | INTRAVENOUS | Status: AC | PRN
  Administered 2015-07-31: 500 [IU]
  Filled 2015-07-31: qty 5

## 2015-07-31 MED ORDER — SODIUM CHLORIDE 0.9% FLUSH
10.0000 mL | INTRAVENOUS | Status: DC | PRN
  Administered 2015-07-31: 10 mL via INTRAVENOUS
  Filled 2015-07-31: qty 10

## 2015-07-31 MED ORDER — DIPHENHYDRAMINE HCL 25 MG PO CAPS
25.0000 mg | ORAL_CAPSULE | Freq: Once | ORAL | Status: AC
  Administered 2015-07-31: 25 mg via ORAL

## 2015-07-31 MED ORDER — ACETAMINOPHEN 325 MG PO TABS
ORAL_TABLET | ORAL | Status: AC
  Filled 2015-07-31: qty 2

## 2015-07-31 MED ORDER — TRASTUZUMAB CHEMO 150 MG IV SOLR
6.0000 mg/kg | Freq: Once | INTRAVENOUS | Status: AC
  Administered 2015-07-31: 567 mg via INTRAVENOUS
  Filled 2015-07-31: qty 27

## 2015-07-31 MED ORDER — ACETAMINOPHEN 325 MG PO TABS
650.0000 mg | ORAL_TABLET | Freq: Once | ORAL | Status: AC
  Administered 2015-07-31: 650 mg via ORAL

## 2015-07-31 MED ORDER — SODIUM CHLORIDE 0.9 % IJ SOLN
10.0000 mL | INTRAMUSCULAR | Status: DC | PRN
  Administered 2015-07-31: 10 mL
  Filled 2015-07-31: qty 10

## 2015-07-31 MED ORDER — TRASTUZUMAB CHEMO INJECTION 440 MG
6.0000 mg/kg | Freq: Once | INTRAVENOUS | Status: DC
  Filled 2015-07-31: qty 27

## 2015-07-31 NOTE — Patient Instructions (Signed)
Cancer Center Discharge Instructions for Patients Receiving Chemotherapy  Today you received the following chemotherapy agents: Herceptin   To help prevent nausea and vomiting after your treatment, we encourage you to take your nausea medication as directed.    If you develop nausea and vomiting that is not controlled by your nausea medication, call the clinic.   BELOW ARE SYMPTOMS THAT SHOULD BE REPORTED IMMEDIATELY:  *FEVER GREATER THAN 100.5 F  *CHILLS WITH OR WITHOUT FEVER  NAUSEA AND VOMITING THAT IS NOT CONTROLLED WITH YOUR NAUSEA MEDICATION  *UNUSUAL SHORTNESS OF BREATH  *UNUSUAL BRUISING OR BLEEDING  TENDERNESS IN MOUTH AND THROAT WITH OR WITHOUT PRESENCE OF ULCERS  *URINARY PROBLEMS  *BOWEL PROBLEMS  UNUSUAL RASH Items with * indicate a potential emergency and should be followed up as soon as possible.  Feel free to call the clinic you have any questions or concerns. The clinic phone number is [phone].  Please show the CHEMO ALERT CARD at check-in to the Emergency Department and triage nurse.   

## 2015-08-01 ENCOUNTER — Telehealth: Payer: Self-pay | Admitting: *Deleted

## 2015-08-01 NOTE — Telephone Encounter (Signed)
[name] left a message on RN VM stating onset post infusion of " abd cramping - not bad but noticeable ".   She states she had emergency appendictomy last week and since then has been using [place] for diarrhea- " which due to the cramping held taking it "  Pt states she is afebrile " but my husband is concerned and wanted me to call "  This RN returned call to given number of [phone] and obtained an identified VM.  Message left per concerns with request to return call for further discussion- cramping is not a known side effect of Herceptin- but RN would like further information.

## 2015-08-07 ENCOUNTER — Other Ambulatory Visit (HOSPITAL_BASED_OUTPATIENT_CLINIC_OR_DEPARTMENT_OTHER): Payer: BLUE CROSS/BLUE SHIELD

## 2015-08-07 DIAGNOSIS — C50411 Malignant neoplasm of upper-outer quadrant of right female breast: Secondary | ICD-10-CM

## 2015-08-07 LAB — CBC WITH DIFFERENTIAL/PLATELET
BASO%: 0.3 % (ref 0.0–2.0)
Basophils Absolute: 0 10*3/uL (ref 0.0–0.1)
EOS%: 2 % (ref 0.0–7.0)
Eosinophils Absolute: 0.1 10*3/uL (ref 0.0–0.5)
HCT: 29.3 % — ABNORMAL LOW (ref 34.8–46.6)
HGB: 10 g/dL — ABNORMAL LOW (ref 11.6–15.9)
LYMPH%: 20.9 % (ref 14.0–49.7)
MCH: 33.6 pg (ref 25.1–34.0)
MCHC: 34 g/dL (ref 31.5–36.0)
MCV: 98.9 fL (ref 79.5–101.0)
MONO#: 0.6 10*3/uL (ref 0.1–0.9)
MONO%: 9.7 % (ref 0.0–14.0)
NEUT#: 3.9 10*3/uL (ref 1.5–6.5)
NEUT%: 67.1 % (ref 38.4–76.8)
Platelets: 255 10*3/uL (ref 145–400)
RBC: 2.97 10*6/uL — ABNORMAL LOW (ref 3.70–5.45)
RDW: 16.8 % — ABNORMAL HIGH (ref 11.2–14.5)
WBC: 5.8 10*3/uL (ref 3.9–10.3)
lymph#: 1.2 10*3/uL (ref 0.9–3.3)

## 2015-08-07 LAB — COMPREHENSIVE METABOLIC PANEL
ALT: 24 U/L (ref 0–55)
AST: 25 U/L (ref 5–34)
Albumin: 3.4 g/dL — ABNORMAL LOW (ref 3.5–5.0)
Alkaline Phosphatase: 84 U/L (ref 40–150)
Anion Gap: 9 mEq/L (ref 3–11)
BUN: 28 mg/dL — ABNORMAL HIGH (ref 7.0–26.0)
CO2: 26 mEq/L (ref 22–29)
Calcium: 9.1 mg/dL (ref 8.4–10.4)
Chloride: 108 mEq/L (ref 98–109)
Creatinine: 1.1 mg/dL (ref 0.6–1.1)
EGFR: 51 mL/min/{1.73_m2} — ABNORMAL LOW (ref >90)
Glucose: 152 mg/dl — ABNORMAL HIGH (ref 70–140)
Potassium: 4.6 mEq/L (ref 3.5–5.1)
Sodium: 142 mEq/L (ref 136–145)
Total Bilirubin: 0.94 mg/dL (ref 0.20–1.20)
Total Protein: 6.4 g/dL (ref 6.4–8.3)

## 2015-08-09 ENCOUNTER — Encounter (HOSPITAL_COMMUNITY): Payer: Self-pay

## 2015-08-09 ENCOUNTER — Ambulatory Visit (HOSPITAL_COMMUNITY)
Admission: RE | Admit: 2015-08-09 | Discharge: 2015-08-09 | Disposition: A | Discharge status: Home / Self Care | Payer: BLUE CROSS/BLUE SHIELD | Source: Ambulatory Visit | Attending: Cardiology | Admitting: Cardiology

## 2015-08-09 VITALS — BP 130/68 | HR 62 | Wt 213.8 lb

## 2015-08-09 DIAGNOSIS — C773 Secondary and unspecified malignant neoplasm of axilla and upper limb lymph nodes: Secondary | ICD-10-CM

## 2015-08-09 DIAGNOSIS — C50411 Malignant neoplasm of upper-outer quadrant of right female breast: Secondary | ICD-10-CM | POA: Diagnosis not present

## 2015-08-09 DIAGNOSIS — C50919 Malignant neoplasm of unspecified site of unspecified female breast: Secondary | ICD-10-CM | POA: Diagnosis not present

## 2015-08-09 DIAGNOSIS — Z853 Personal history of malignant neoplasm of breast: Secondary | ICD-10-CM | POA: Insufficient documentation

## 2015-08-09 DIAGNOSIS — I10 Essential (primary) hypertension: Secondary | ICD-10-CM | POA: Insufficient documentation

## 2015-08-09 DIAGNOSIS — G4733 Obstructive sleep apnea (adult) (pediatric): Secondary | ICD-10-CM | POA: Diagnosis not present

## 2015-08-09 DIAGNOSIS — Z9221 Personal history of antineoplastic chemotherapy: Secondary | ICD-10-CM | POA: Insufficient documentation

## 2015-08-09 NOTE — Progress Notes (Signed)
Patient ID: Darlene [name], female   DOB: 02/02/1950, 65 y.o.   MRN: [id number] Referring Physician:Dr Magrinat   HPI: Darlene [name] is a 65 year old with history of R breast cancer, right axillary lymph node biopsy 07/19/2014 for a cT2 pN1, stage IIB invasive ductal carcinoma, grade 2, estrogen and progesterone receptor negative, with an MIB-1 of 90%, and HER-2 positive , OSA -> CPAP, bariatric surgery, and DM, referred to cardio-oncology clinic by Dr Jana Hakim  Completed neoadjuvant chemotherapy will consist of carboplatin, docetaxel, trastuzumab and pertuzumab given every 3 weeks 6.  Trastuzumab + capecitabine to be continued until 9/17.  She had lumpectomy 12/16 and had mastectomy in 1/17.  She has completed radiation.   Since last visit, she had appendicitis with appendectomy.   Today she denies SOB/CP.  Doing ok currently.    Echo (7/16) with EF 60-65%, grade II diastolic dysfunction.  Echo (10/16) with EF 66%, GLS -17%. Echo (1/17) with EF 60-65%, grade II diastolic dysfunction, lateral s' 10, GLS -18.8% Echo (4/17) with EF 60-65%, grade II diastolic dysfunction, lateral s' 9.5, strain poorly traced and off axis, normal RV size and systolic function.  Echo (6/17) with EF 55-60%, aortic sclerosis without stenosis, difficult images for strain, probably not accurate.   Review of Systems: All systems reviewed and negative except as per HPI.   Past Medical History  Diagnosis Date  . Obesity   . Hypertension   . Hyperlipidemia   . Diabetes mellitus without complication ([place])     14+ years  . Eating disorder     binge eating  . Balance problem   . Arthritis     in fingers, shoulders  . Anemia     hx of  . Depression   . Ovarian cyst rupture     possible  . Clumsiness   . Breast cancer of upper-outer quadrant of right female breast ([place]) 07/22/2014  . Breast cancer ([place])   . Anxiety   . Pneumonia   . Neuromuscular disorder ([place])     diabetic neuropathy  . Obstructive sleep  apnea on CPAP     uses cpap nightly  . Heart murmur   . Radiation 04/05/15-05/19/15    right chest wall 50.4 Gy, mastectomy scar 10 Gy  . Thyroid nodule 06/2014  . Pneumonia 11/2014  . HCAP (healthcare-associated pneumonia) 12/18/2014  . Epistaxis 08/26/2014    Current Outpatient Prescriptions  Medication Sig Dispense Refill  . acetaminophen (TYLENOL) 325 MG tablet Take 650 mg by mouth 2 (two) times daily as needed for moderate pain or headache. Reported on 04/17/2015    . calcium citrate-vitamin D (CITRACAL+D) 315-200 MG-UNIT tablet Take 1 tablet by mouth 3 (three) times daily.    . capecitabine (XELODA) 500 MG tablet Take 3 tablets by mouth after a meal 2 times a day seven days on, seven days off 84 tablet 4  . diphenhydrAMINE (BENADRYL) 25 MG tablet Take 25 mg by mouth daily as needed for allergies or sleep. Reported on 03/06/2015    . glucose blood (ONETOUCH VERIO) test strip 1 each by Other route as needed for other. Reported on 03/06/2015    . losartan (COZAAR) 100 MG tablet Take 1 tablet by mouth daily. Reported on 03/06/2015    . Melatonin 5 MG TABS Take 5 mg by mouth at bedtime. Reported on 03/06/2015    . metoCLOPramide (REGLAN) 5 MG tablet Take 1 tablet (5 mg total) by mouth 3 (three) times daily before meals. 90 tablet  4  . mometasone (NASONEX) 50 MCG/ACT nasal spray Place 1-2 sprays into the nose daily as needed (for allergies.).     [name] NOVOLIN 70/30 (70-30) 100 UNIT/ML injection Inject 15 Units into the skin 2 (two) times daily with a meal. Reported on 03/06/2015    . Pediatric Multivitamins-Iron (FLINTSTONES PLUS IRON) chewable tablet Chew 1 tablet by mouth 2 (two) times daily.    . Probiotic Product ([employer]S PROBIOTIC PO) Take 1 capsule by mouth daily. Reported on 03/06/2015    . Trastuzumab (HERCEPTIN IV) Inject into the vein every 21 ( twenty-one) days. Reported on 03/06/2015    . Vortioxetine HBr (TRINTELLIX) 20 MG TABS Take 20 mg by mouth daily. Reported on 03/06/2015     No current  facility-administered medications for this encounter.    Allergies  Allergen Reactions  . Nsaids Other (See Comments)    H/o gastric bypass - avoid NSAIDs  . Peanuts [Peanut Oil] Itching    "Large amounts"  . Tape Hives and Rash      Social History   Social History  . Marital Status: Married    Spouse Name: [name]  . Number of Children: 0  . Years of Education: Bachelor's   Occupational History  . Theatre stage manager    Social History Main Topics  . Smoking status: Never Smoker   . Smokeless tobacco: Never Used  . Alcohol Use: 0.0 oz/week    0 Standard drinks or equivalent per week     Comment: social  . Drug Use: No  . Sexual Activity: Yes    Birth Control/ Protection: Post-menopausal   Other Topics Concern  . Not on file   Social History Narrative   Patient is married [name]) and lives at home with her husband.   Patient does not have any children.   Patient is working for a [employer].   Patient has a Bachelor's degree.   Patient is right-handed.   Patient does not drink any caffeine.      Family History  Problem Relation Age of Onset  . CVA Mother   . Diabetes Father   . Heart failure Father   . Hypertension Sister   . Diabetes Brother   . Diabetes Paternal Uncle   . Breast cancer Paternal Grandmother     Filed Vitals:   08/09/15 1008  BP: 130/68  Pulse: 62  Weight: 213 lb 12 oz (96.956 kg)  SpO2: 99%    PHYSICAL EXAM: General:  Well appearing. No respiratory difficulty HEENT: normal Neck: supple. no JVD. Carotids 2+ bilat; no bruits. No lymphadenopathy or thryomegaly appreciated. Cor: PMI nondisplaced. Regular rate & rhythm. No rubs, gallops or murmurs. Lungs: clear Abdomen: soft, nontender, nondistended. No hepatosplenomegaly. No bruits or masses. Good bowel sounds. Extremities: no cyanosis, clubbing, rash, edema Neuro: alert & oriented x 3, cranial nerves grossly intact. moves all 4 extremities w/o difficulty. Affect  pleasant.  ASSESSMENT & PLAN:  1. Breast cancer: I reviewed today's echo. Images are difficult for strain. EF remains in normal range, 55-60%.No evidence for trastuzumab-related cardio-toxicity.  She will followup in 3 months with repeat echo.  She will have completed trastuzumab at this point.  This likely will be her last echo if it remains stable.  2. HTN: Continue current regimen.  3. OSA: Continue nightly CPAP   Loralie Champagne 08/09/2015

## 2015-08-09 NOTE — Patient Instructions (Signed)
Follow up 3 months with echocardiogram and appointment with Dr. Aundra Dubin.  Do the following things EVERYDAY: 1) Weigh yourself in the morning before breakfast. Write it down and keep it in a log. 2) Take your medicines as prescribed 3) Eat low salt foods-Limit salt (sodium) to 2000 mg per day.  4) Stay as active as you can everyday 5) Limit all fluids for the day to less than 2 liters

## 2015-08-21 ENCOUNTER — Ambulatory Visit (HOSPITAL_BASED_OUTPATIENT_CLINIC_OR_DEPARTMENT_OTHER): Payer: BLUE CROSS/BLUE SHIELD

## 2015-08-21 ENCOUNTER — Ambulatory Visit: Payer: BLUE CROSS/BLUE SHIELD

## 2015-08-21 ENCOUNTER — Other Ambulatory Visit (HOSPITAL_BASED_OUTPATIENT_CLINIC_OR_DEPARTMENT_OTHER): Payer: BLUE CROSS/BLUE SHIELD

## 2015-08-21 VITALS — BP 171/67 | HR 54 | Temp 98.1°F | Resp 18

## 2015-08-21 DIAGNOSIS — C50411 Malignant neoplasm of upper-outer quadrant of right female breast: Secondary | ICD-10-CM

## 2015-08-21 DIAGNOSIS — Z95828 Presence of other vascular implants and grafts: Secondary | ICD-10-CM | POA: Insufficient documentation

## 2015-08-21 DIAGNOSIS — Z5112 Encounter for antineoplastic immunotherapy: Secondary | ICD-10-CM

## 2015-08-21 LAB — COMPREHENSIVE METABOLIC PANEL
ALT: 31 U/L (ref 0–55)
AST: 27 U/L (ref 5–34)
Albumin: 3.3 g/dL — ABNORMAL LOW (ref 3.5–5.0)
Alkaline Phosphatase: 90 U/L (ref 40–150)
Anion Gap: 8 mEq/L (ref 3–11)
BUN: 24.4 mg/dL (ref 7.0–26.0)
CO2: 24 mEq/L (ref 22–29)
Calcium: 9.1 mg/dL (ref 8.4–10.4)
Chloride: 109 mEq/L (ref 98–109)
Creatinine: 1.1 mg/dL (ref 0.6–1.1)
EGFR: 52 mL/min/{1.73_m2} — ABNORMAL LOW (ref >90)
Glucose: 155 mg/dl — ABNORMAL HIGH (ref 70–140)
Potassium: 4.2 mEq/L (ref 3.5–5.1)
Sodium: 141 mEq/L (ref 136–145)
Total Bilirubin: 0.8 mg/dL (ref 0.20–1.20)
Total Protein: 6.2 g/dL — ABNORMAL LOW (ref 6.4–8.3)

## 2015-08-21 LAB — CBC WITH DIFFERENTIAL/PLATELET
BASO%: 0.3 % (ref 0.0–2.0)
Basophils Absolute: 0 10*3/uL (ref 0.0–0.1)
EOS%: 2.1 % (ref 0.0–7.0)
Eosinophils Absolute: 0.1 10*3/uL (ref 0.0–0.5)
HCT: 28.5 % — ABNORMAL LOW (ref 34.8–46.6)
HGB: 9.7 g/dL — ABNORMAL LOW (ref 11.6–15.9)
LYMPH%: 17.5 % (ref 14.0–49.7)
MCH: 33.4 pg (ref 25.1–34.0)
MCHC: 33.8 g/dL (ref 31.5–36.0)
MCV: 98.6 fL (ref 79.5–101.0)
MONO#: 0.4 10*3/uL (ref 0.1–0.9)
MONO%: 7.1 % (ref 0.0–14.0)
NEUT#: 3.8 10*3/uL (ref 1.5–6.5)
NEUT%: 73 % (ref 38.4–76.8)
Platelets: 185 10*3/uL (ref 145–400)
RBC: 2.89 10*6/uL — ABNORMAL LOW (ref 3.70–5.45)
RDW: 17.7 % — ABNORMAL HIGH (ref 11.2–14.5)
WBC: 5.2 10*3/uL (ref 3.9–10.3)
lymph#: 0.9 10*3/uL (ref 0.9–3.3)

## 2015-08-21 MED ORDER — SODIUM CHLORIDE 0.9 % IV SOLN
Freq: Once | INTRAVENOUS | Status: AC
  Administered 2015-08-21: 09:00:00 via INTRAVENOUS

## 2015-08-21 MED ORDER — DIPHENHYDRAMINE HCL 25 MG PO CAPS
25.0000 mg | ORAL_CAPSULE | Freq: Once | ORAL | Status: AC
  Administered 2015-08-21: 25 mg via ORAL

## 2015-08-21 MED ORDER — SODIUM CHLORIDE 0.9 % IJ SOLN
10.0000 mL | INTRAMUSCULAR | Status: DC | PRN
  Administered 2015-08-21: 10 mL
  Filled 2015-08-21: qty 10

## 2015-08-21 MED ORDER — HEPARIN SOD (PORK) LOCK FLUSH 100 UNIT/ML IV SOLN
500.0000 [IU] | Freq: Once | INTRAVENOUS | Status: AC | PRN
  Administered 2015-08-21: 500 [IU]
  Filled 2015-08-21: qty 5

## 2015-08-21 MED ORDER — DIPHENHYDRAMINE HCL 25 MG PO CAPS
ORAL_CAPSULE | ORAL | Status: AC
  Filled 2015-08-21: qty 1

## 2015-08-21 MED ORDER — ACETAMINOPHEN 325 MG PO TABS
650.0000 mg | ORAL_TABLET | Freq: Once | ORAL | Status: AC
  Administered 2015-08-21: 650 mg via ORAL

## 2015-08-21 MED ORDER — SODIUM CHLORIDE 0.9 % IJ SOLN
10.0000 mL | INTRAMUSCULAR | Status: DC | PRN
  Administered 2015-08-21: 10 mL via INTRAVENOUS
  Filled 2015-08-21: qty 10

## 2015-08-21 MED ORDER — TRASTUZUMAB CHEMO 150 MG IV SOLR
6.0000 mg/kg | Freq: Once | INTRAVENOUS | Status: AC
  Administered 2015-08-21: 567 mg via INTRAVENOUS
  Filled 2015-08-21: qty 27

## 2015-08-21 MED ORDER — ACETAMINOPHEN 325 MG PO TABS
ORAL_TABLET | ORAL | Status: AC
  Filled 2015-08-21: qty 2

## 2015-08-21 NOTE — Patient Instructions (Signed)
[facility] Discharge Instructions for Patients Receiving Chemotherapy  Today you received the following chemotherapy agents: Herceptin   To help prevent nausea and vomiting after your treatment, we encourage you to take your nausea medication as directed.    If you develop nausea and vomiting that is not controlled by your nausea medication, call the clinic.   BELOW ARE SYMPTOMS THAT SHOULD BE REPORTED IMMEDIATELY:  *FEVER GREATER THAN 100.5 F  *CHILLS WITH OR WITHOUT FEVER  NAUSEA AND VOMITING THAT IS NOT CONTROLLED WITH YOUR NAUSEA MEDICATION  *UNUSUAL SHORTNESS OF BREATH  *UNUSUAL BRUISING OR BLEEDING  TENDERNESS IN MOUTH AND THROAT WITH OR WITHOUT PRESENCE OF ULCERS  *URINARY PROBLEMS  *BOWEL PROBLEMS  UNUSUAL RASH Items with * indicate a potential emergency and should be followed up as soon as possible.  Feel free to call the clinic you have any questions or concerns. The clinic phone number is [phone].  Please show the CHEMO ALERT CARD at check-in to the Emergency Department and triage nurse.   

## 2015-08-21 NOTE — Patient Instructions (Signed)

## 2015-09-04 ENCOUNTER — Other Ambulatory Visit (HOSPITAL_BASED_OUTPATIENT_CLINIC_OR_DEPARTMENT_OTHER): Payer: BLUE CROSS/BLUE SHIELD

## 2015-09-04 DIAGNOSIS — C50411 Malignant neoplasm of upper-outer quadrant of right female breast: Secondary | ICD-10-CM

## 2015-09-04 LAB — CBC WITH DIFFERENTIAL/PLATELET
BASO%: 0.2 % (ref 0.0–2.0)
Basophils Absolute: 0 10*3/uL (ref 0.0–0.1)
EOS%: 1.9 % (ref 0.0–7.0)
Eosinophils Absolute: 0.1 10*3/uL (ref 0.0–0.5)
HCT: 31.5 % — ABNORMAL LOW (ref 34.8–46.6)
HGB: 10.7 g/dL — ABNORMAL LOW (ref 11.6–15.9)
LYMPH%: 22.1 % (ref 14.0–49.7)
MCH: 33.3 pg (ref 25.1–34.0)
MCHC: 34 g/dL (ref 31.5–36.0)
MCV: 98.1 fL (ref 79.5–101.0)
MONO#: 0.4 10*3/uL (ref 0.1–0.9)
MONO%: 6.7 % (ref 0.0–14.0)
NEUT#: 4.5 10*3/uL (ref 1.5–6.5)
NEUT%: 69.1 % (ref 38.4–76.8)
Platelets: 206 10*3/uL (ref 145–400)
RBC: 3.21 10*6/uL — ABNORMAL LOW (ref 3.70–5.45)
RDW: 17.5 % — ABNORMAL HIGH (ref 11.2–14.5)
WBC: 6.5 10*3/uL (ref 3.9–10.3)
lymph#: 1.4 10*3/uL (ref 0.9–3.3)

## 2015-09-04 LAB — COMPREHENSIVE METABOLIC PANEL
ALT: 22 U/L (ref 0–55)
AST: 22 U/L (ref 5–34)
Albumin: 3.6 g/dL (ref 3.5–5.0)
Alkaline Phosphatase: 89 U/L (ref 40–150)
Anion Gap: 8 mEq/L (ref 3–11)
BUN: 31.4 mg/dL — ABNORMAL HIGH (ref 7.0–26.0)
CO2: 25 mEq/L (ref 22–29)
Calcium: 9.6 mg/dL (ref 8.4–10.4)
Chloride: 105 mEq/L (ref 98–109)
Creatinine: 1.3 mg/dL — ABNORMAL HIGH (ref 0.6–1.1)
EGFR: 43 mL/min/{1.73_m2} — ABNORMAL LOW (ref >90)
Glucose: 243 mg/dl — ABNORMAL HIGH (ref 70–140)
Potassium: 4.3 mEq/L (ref 3.5–5.1)
Sodium: 139 mEq/L (ref 136–145)
Total Bilirubin: 0.79 mg/dL (ref 0.20–1.20)
Total Protein: 6.8 g/dL (ref 6.4–8.3)

## 2015-09-11 ENCOUNTER — Ambulatory Visit: Payer: BLUE CROSS/BLUE SHIELD

## 2015-09-11 ENCOUNTER — Other Ambulatory Visit: Payer: BLUE CROSS/BLUE SHIELD

## 2015-09-12 ENCOUNTER — Other Ambulatory Visit: Payer: BLUE CROSS/BLUE SHIELD

## 2015-09-12 ENCOUNTER — Ambulatory Visit: Payer: BLUE CROSS/BLUE SHIELD

## 2015-09-12 ENCOUNTER — Ambulatory Visit (HOSPITAL_BASED_OUTPATIENT_CLINIC_OR_DEPARTMENT_OTHER): Payer: BLUE CROSS/BLUE SHIELD

## 2015-09-12 ENCOUNTER — Other Ambulatory Visit (HOSPITAL_BASED_OUTPATIENT_CLINIC_OR_DEPARTMENT_OTHER): Payer: BLUE CROSS/BLUE SHIELD

## 2015-09-12 VITALS — BP 138/67 | HR 64 | Temp 98.3°F | Resp 18

## 2015-09-12 DIAGNOSIS — C50411 Malignant neoplasm of upper-outer quadrant of right female breast: Secondary | ICD-10-CM

## 2015-09-12 DIAGNOSIS — Z5112 Encounter for antineoplastic immunotherapy: Secondary | ICD-10-CM | POA: Diagnosis not present

## 2015-09-12 DIAGNOSIS — Z95828 Presence of other vascular implants and grafts: Secondary | ICD-10-CM

## 2015-09-12 LAB — COMPREHENSIVE METABOLIC PANEL
ALT: 20 U/L (ref 0–55)
AST: 22 U/L (ref 5–34)
Albumin: 3.6 g/dL (ref 3.5–5.0)
Alkaline Phosphatase: 89 U/L (ref 40–150)
Anion Gap: 9 mEq/L (ref 3–11)
BUN: 31.5 mg/dL — ABNORMAL HIGH (ref 7.0–26.0)
CO2: 21 mEq/L — ABNORMAL LOW (ref 22–29)
Calcium: 9 mg/dL (ref 8.4–10.4)
Chloride: 109 mEq/L (ref 98–109)
Creatinine: 1.3 mg/dL — ABNORMAL HIGH (ref 0.6–1.1)
EGFR: 44 mL/min/{1.73_m2} — ABNORMAL LOW (ref >90)
Glucose: 170 mg/dl — ABNORMAL HIGH (ref 70–140)
Potassium: 4.4 mEq/L (ref 3.5–5.1)
Sodium: 139 mEq/L (ref 136–145)
Total Bilirubin: 0.89 mg/dL (ref 0.20–1.20)
Total Protein: 6.5 g/dL (ref 6.4–8.3)

## 2015-09-12 LAB — CBC WITH DIFFERENTIAL/PLATELET
BASO%: 0.3 % (ref 0.0–2.0)
Basophils Absolute: 0 10*3/uL (ref 0.0–0.1)
EOS%: 1.8 % (ref 0.0–7.0)
Eosinophils Absolute: 0.1 10*3/uL (ref 0.0–0.5)
HCT: 30.3 % — ABNORMAL LOW (ref 34.8–46.6)
HGB: 10.2 g/dL — ABNORMAL LOW (ref 11.6–15.9)
LYMPH%: 23.8 % (ref 14.0–49.7)
MCH: 33.6 pg (ref 25.1–34.0)
MCHC: 33.6 g/dL (ref 31.5–36.0)
MCV: 100.1 fL (ref 79.5–101.0)
MONO#: 0.4 10*3/uL (ref 0.1–0.9)
MONO%: 7.5 % (ref 0.0–14.0)
NEUT#: 4 10*3/uL (ref 1.5–6.5)
NEUT%: 66.6 % (ref 38.4–76.8)
Platelets: 192 10*3/uL (ref 145–400)
RBC: 3.02 10*6/uL — ABNORMAL LOW (ref 3.70–5.45)
RDW: 19.2 % — ABNORMAL HIGH (ref 11.2–14.5)
WBC: 6 10*3/uL (ref 3.9–10.3)
lymph#: 1.4 10*3/uL (ref 0.9–3.3)

## 2015-09-12 MED ORDER — DIPHENHYDRAMINE HCL 25 MG PO CAPS
ORAL_CAPSULE | ORAL | Status: AC
  Filled 2015-09-12: qty 1

## 2015-09-12 MED ORDER — SODIUM CHLORIDE 0.9 % IV SOLN
Freq: Once | INTRAVENOUS | Status: AC
  Administered 2015-09-12: 15:00:00 via INTRAVENOUS

## 2015-09-12 MED ORDER — ACETAMINOPHEN 325 MG PO TABS
ORAL_TABLET | ORAL | Status: AC
  Filled 2015-09-12: qty 2

## 2015-09-12 MED ORDER — ACETAMINOPHEN 325 MG PO TABS
650.0000 mg | ORAL_TABLET | Freq: Once | ORAL | Status: AC
  Administered 2015-09-12: 650 mg via ORAL

## 2015-09-12 MED ORDER — HEPARIN SOD (PORK) LOCK FLUSH 100 UNIT/ML IV SOLN
500.0000 [IU] | Freq: Once | INTRAVENOUS | Status: AC | PRN
  Administered 2015-09-12: 500 [IU]
  Filled 2015-09-12: qty 5

## 2015-09-12 MED ORDER — SODIUM CHLORIDE 0.9 % IV SOLN
6.0000 mg/kg | Freq: Once | INTRAVENOUS | Status: AC
  Administered 2015-09-12: 567 mg via INTRAVENOUS
  Filled 2015-09-12: qty 27

## 2015-09-12 MED ORDER — SODIUM CHLORIDE 0.9 % IJ SOLN
10.0000 mL | INTRAMUSCULAR | Status: DC | PRN
Start: 2015-09-12 — End: 2015-09-12
  Administered 2015-09-12: 10 mL via INTRAVENOUS
  Filled 2015-09-12: qty 10

## 2015-09-12 MED ORDER — SODIUM CHLORIDE 0.9 % IJ SOLN
10.0000 mL | INTRAMUSCULAR | Status: DC | PRN
  Administered 2015-09-12: 10 mL
  Filled 2015-09-12: qty 10

## 2015-09-12 MED ORDER — DIPHENHYDRAMINE HCL 25 MG PO CAPS
25.0000 mg | ORAL_CAPSULE | Freq: Once | ORAL | Status: AC
  Administered 2015-09-12: 25 mg via ORAL

## 2015-09-12 NOTE — Patient Instructions (Signed)
[facility] Discharge Instructions for Patients Receiving Chemotherapy  Today you received the following chemotherapy agents: Herceptin   To help prevent nausea and vomiting after your treatment, we encourage you to take your nausea medication as directed.    If you develop nausea and vomiting that is not controlled by your nausea medication, call the clinic.   BELOW ARE SYMPTOMS THAT SHOULD BE REPORTED IMMEDIATELY:  *FEVER GREATER THAN 100.5 F  *CHILLS WITH OR WITHOUT FEVER  NAUSEA AND VOMITING THAT IS NOT CONTROLLED WITH YOUR NAUSEA MEDICATION  *UNUSUAL SHORTNESS OF BREATH  *UNUSUAL BRUISING OR BLEEDING  TENDERNESS IN MOUTH AND THROAT WITH OR WITHOUT PRESENCE OF ULCERS  *URINARY PROBLEMS  *BOWEL PROBLEMS  UNUSUAL RASH Items with * indicate a potential emergency and should be followed up as soon as possible.  Feel free to call the clinic you have any questions or concerns. The clinic phone number is [phone].  Please show the CHEMO ALERT CARD at check-in to the Emergency Department and triage nurse.   

## 2015-09-13 ENCOUNTER — Encounter: Payer: Self-pay | Admitting: Oncology

## 2015-09-18 ENCOUNTER — Other Ambulatory Visit (HOSPITAL_BASED_OUTPATIENT_CLINIC_OR_DEPARTMENT_OTHER): Payer: BLUE CROSS/BLUE SHIELD

## 2015-09-18 ENCOUNTER — Other Ambulatory Visit: Payer: Self-pay

## 2015-09-18 DIAGNOSIS — C50411 Malignant neoplasm of upper-outer quadrant of right female breast: Secondary | ICD-10-CM

## 2015-09-18 DIAGNOSIS — K760 Fatty (change of) liver, not elsewhere classified: Secondary | ICD-10-CM

## 2015-09-18 LAB — CBC WITH DIFFERENTIAL/PLATELET
BASO%: 0.2 % (ref 0.0–2.0)
Basophils Absolute: 0 10*3/uL (ref 0.0–0.1)
EOS%: 1.7 % (ref 0.0–7.0)
Eosinophils Absolute: 0.1 10*3/uL (ref 0.0–0.5)
HCT: 30.3 % — ABNORMAL LOW (ref 34.8–46.6)
HGB: 10.2 g/dL — ABNORMAL LOW (ref 11.6–15.9)
LYMPH%: 22.3 % (ref 14.0–49.7)
MCH: 33.3 pg (ref 25.1–34.0)
MCHC: 33.7 g/dL (ref 31.5–36.0)
MCV: 99 fL (ref 79.5–101.0)
MONO#: 0.4 10*3/uL (ref 0.1–0.9)
MONO%: 7.4 % (ref 0.0–14.0)
NEUT#: 3.7 10*3/uL (ref 1.5–6.5)
NEUT%: 68.4 % (ref 38.4–76.8)
Platelets: 175 10*3/uL (ref 145–400)
RBC: 3.06 10*6/uL — ABNORMAL LOW (ref 3.70–5.45)
RDW: 17.6 % — ABNORMAL HIGH (ref 11.2–14.5)
WBC: 5.4 10*3/uL (ref 3.9–10.3)
lymph#: 1.2 10*3/uL (ref 0.9–3.3)

## 2015-09-18 LAB — COMPREHENSIVE METABOLIC PANEL
ALT: 22 U/L (ref 0–55)
AST: 25 U/L (ref 5–34)
Albumin: 3.5 g/dL (ref 3.5–5.0)
Alkaline Phosphatase: 84 U/L (ref 40–150)
Anion Gap: 8 mEq/L (ref 3–11)
BUN: 34.1 mg/dL — ABNORMAL HIGH (ref 7.0–26.0)
CO2: 26 mEq/L (ref 22–29)
Calcium: 9.3 mg/dL (ref 8.4–10.4)
Chloride: 108 mEq/L (ref 98–109)
Creatinine: 1.2 mg/dL — ABNORMAL HIGH (ref 0.6–1.1)
EGFR: 49 mL/min/{1.73_m2} — ABNORMAL LOW (ref >90)
Glucose: 180 mg/dl — ABNORMAL HIGH (ref 70–140)
Potassium: 4.4 mEq/L (ref 3.5–5.1)
Sodium: 142 mEq/L (ref 136–145)
Total Bilirubin: 0.72 mg/dL (ref 0.20–1.20)
Total Protein: 6.5 g/dL (ref 6.4–8.3)

## 2015-09-20 ENCOUNTER — Other Ambulatory Visit: Payer: Self-pay | Admitting: Oncology

## 2015-10-01 NOTE — Progress Notes (Signed)
[place]  Telephone:([phone]) [phone] Fax:([phone]) [phone]   ID: SHANI FITCH DOB: 06/30/1950  MR#: [id number]  QBH#:[id number]  Patient Care Team: Thressa Sheller, MD as PCP - General (Internal Medicine) Bryson Ha Himmelrich, RD (Inactive) as Dietitian (Bariatrics) Stark Klein, MD as Consulting Physician (General Surgery) Chauncey Cruel, MD as Consulting Physician (Oncology) Arloa Koh, MD as Consulting Physician (Radiation Oncology) Mauro Kaufmann, RN as Registered Nurse Rockwell Germany, RN as Registered Nurse Jacelyn Pi, MD as Consulting Physician (Endocrinology) Kem Boroughs, FNP as Nurse Practitioner (Family Medicine) [name], MD as Consulting Physician (Plastic Surgery) Larey Dresser, MD as Consulting Physician (Cardiology) PCP: Thressa Sheller, MD OTHER MD:  CHIEF COMPLAINT:  HER-2 positive, estrogen receptor negative breast cancer  CURRENT TREATMENT:  Observation  BREAST CANCER HISTORY: From the original intake note:  "[name]" had screening mammography at her gynecologist suggestive of a possible abnormality in the right breast. However right diagnostic mammogram 04/12/2013 at [facility] was negative. More recently however, she noted a lump in her right breast and brought it to her gynecologist's attention. On 07/18/2014 the patient had bilateral diagnostic mammography with tomosynthesis at [facility]. The breast density was category B. In the right breast upper outer quadrant there was an area of focal asymmetry with indistinct margins.[name] Ultrasound was obtained and showed in addition to the mass in question, measuring 1.7 cm, an abnormal-appearing lymph node in the right axillary tail.  On 07/19/2014 the patient underwent biopsy of the right breast massin question as well as a right axillary lymph node. Both were positive for invasive ductal carcinoma, grade 2, estrogen and progesterone receptor negative, with an MIB-1 of 90%, and HER-2 pending.  Incidentally the lymph node biopsy showed a lymphocytic inflammatory component but no lymph node architecture.  Her subsequent history is as detailed below.  INTERVAL HISTORY:  [name] returns today for follow-up of her HER-2/neu positive breast cancer accompanied by her husband [name]. Today is the last day of her trastuzumab year, and she also completed her capecitabine treatments last Wednesday.  REVIEW OF SYSTEMS: [name] has significant problems in her right knee and left shoulder. These are long-standing issues that she has more or less ignored because of the cancer problem and now that the cancer seems to be receding in the background she is spending the more attention. She also has fungus in her nails and has been prescribed Lamisil. I don't have a problem with that. She still has some stabbing pains in the surgical site. She was reassured that this is common. She complains of hearing loss, a runny nose, sciatica, and numb toes. She survived her appendectomy in July without difficulties. She still has mild constipation. It is difficult for her to "push up the stool". She is still on Reglan with no significant relief. She is planning to see Dr. Hassell Done and Dr. Earlie Server nutritionist in the near future. She also tells me she is planning to do more exercises, especially walking. A detailed review of systems today was otherwise stable  PAST MEDICAL HISTORY: Past Medical History:  Diagnosis Date  . Anemia    hx of  . Anxiety   . Arthritis    in fingers, shoulders  . Balance problem   . Breast cancer ([place])   . Breast cancer of upper-outer quadrant of right female breast ([place]) 07/22/2014  . Clumsiness   . Depression   . Diabetes mellitus without complication ([place])    09+ years  . Eating disorder    binge eating  .  Epistaxis 08/26/2014  . HCAP (healthcare-associated pneumonia) 12/18/2014  . Heart murmur   . Hyperlipidemia   . Hypertension   . Neuromuscular disorder ([place])    diabetic neuropathy    . Obesity   . Obstructive sleep apnea on CPAP    uses cpap nightly  . Ovarian cyst rupture    possible  . Pneumonia   . Pneumonia 11/2014  . Radiation 04/05/15-05/19/15   right chest wall 50.4 Gy, mastectomy scar 10 Gy  . Thyroid nodule 06/2014    PAST SURGICAL HISTORY: Past Surgical History:  Procedure Laterality Date  . BIOPSY THYROID Bilateral 06/2014   2 nodules - Benign   . BREAST LUMPECTOMY WITH RADIOACTIVE SEED AND SENTINEL LYMPH NODE BIOPSY Right 12/27/2014   Procedure: BREAST LUMPECTOMY WITH RADIOACTIVE SEED AND SENTINEL LYMPH NODE BIOPSY;  Surgeon: Stark Klein, MD;  Location: [place];  Service: General;  Laterality: Right;  . BREAST REDUCTION SURGERY Bilateral 01/06/2015   Procedure: BILATERAL BREAST REDUCTION, RIGHT ONCOPLASTIC RECONSTRUCTION),LEFT BREAST REDUCTION FOR SYMMETRY;  Surgeon: [name], MD;  Location: [place];  Service: Plastics;  Laterality: Bilateral;  . BREATH TEK H PYLORI N/A 09/15/2012   Procedure: BREATH TEK H PYLORI;  Surgeon: [place] Earls, MD;  Location: [facility];  Service: General;  Laterality: N/A;  . BUNIONECTOMY Left 12/2013  . CARPAL TUNNEL RELEASE Right   . CARPAL TUNNEL RELEASE Left   . DUPUYTREN CONTRACTURE RELEASE    . GASTRIC ROUX-EN-Y N/A 11/02/2012   Procedure: LAPAROSCOPIC ROUX-EN-Y GASTRIC;  Surgeon: [place] Earls, MD;  [facility];  Service: General;  Laterality: N/A;  . LAPAROSCOPIC APPENDECTOMY N/A 07/22/2015   Procedure: APPENDECTOMY LAPAROSCOPIC;  Surgeon: Michael Boston, MD;  [facility];  Service: General;  Laterality: N/A;  . MASTECTOMY Right 02/15/2015   modified  . MODIFIED MASTECTOMY Right 02/15/2015   Procedure: RIGHT MODIFIED RADICAL MASTECTOMY;  Surgeon: Stark Klein, MD;  Location: [place];  Service: General;  Laterality: Right;  . PORTACATH PLACEMENT Left 08/02/2014   Procedure: INSERTION PORT-A-CATH;  Surgeon: Stark Klein, MD;  Location: [place];  Service: General;   Laterality: Left;  . ROTATOR CUFF REPAIR Right 2005  . TOE SURGERY  1970s   bone spur  . TRIGGER FINGER RELEASE     x3    FAMILY HISTORY Family History  Problem Relation Age of Onset  . CVA Mother   . Diabetes Father   . Heart failure Father   . Hypertension Sister   . Diabetes Brother   . Diabetes Paternal Uncle   . Breast cancer Paternal Grandmother    the patient's father died from complications of diabetes at age 84. The patient's mother died at age 60 with a subarachnoid hemorrhage. The patient had one brother, one sister. The only breast cancer was the patient's paternal grandmother diagnosed in her 22s. There is no history of ovarian cancer in the family.  GYNECOLOGIC HISTORY:  Patient's last menstrual period was 10/22/2007.  menarche age 49, the patient is GX P0. She went through the change of life in 2011. She did not take hormone replacement.  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 Her husband [name] is a retired Visual merchandiser (he taught at [employer]).  [name] has 2 children from an earlier marriage, Rockey Situ who teaches in [place], and Lyrical Sowle,  who works at the [employer] in in [place]. He has 1 grand  son, aged 12. The patient and her husband are not church attender's    ADVANCED DIRECTIVES:  Not in place   HEALTH MAINTENANCE: Social History  Substance Use Topics  . Smoking status: Never Smoker  . Smokeless tobacco: Never Used  . Alcohol use 0.0 oz/week     Comment: social     Colonoscopy:  May 2016  PAP:  Bone density: 07/05/2013 normal, with a T score of 0.4  Lipid panel:  Allergies  Allergen Reactions  . Nsaids Other (See Comments)    H/o gastric bypass - avoid NSAIDs  . Peanuts [Peanut Oil] Itching    "Large amounts"  . Tape Hives and Rash    Current Outpatient Prescriptions  Medication Sig Dispense Refill  . acetaminophen (TYLENOL) 325 MG tablet  Take 650 mg by mouth 2 (two) times daily as needed for moderate pain or headache. Reported on 04/17/2015    . calcium citrate-vitamin D (CITRACAL+D) 315-200 MG-UNIT tablet Take 1 tablet by mouth 3 (three) times daily.    . diphenhydrAMINE (BENADRYL) 25 MG tablet Take 25 mg by mouth daily as needed for allergies or sleep. Reported on 03/06/2015    . glucose blood (ONETOUCH VERIO) test strip 1 each by Other route as needed for other. Reported on 03/06/2015    . losartan (COZAAR) 100 MG tablet Take 1 tablet by mouth daily. Reported on 03/06/2015    . Melatonin 5 MG TABS Take 5 mg by mouth at bedtime. Reported on 03/06/2015    . metoCLOPramide (REGLAN) 5 MG tablet TAKE 1 TABLET THREE TIMES DAILY BEFORE MEALS. 90 tablet 0  . mometasone (NASONEX) 50 MCG/ACT nasal spray Place 1-2 sprays into the nose daily as needed (for allergies.).     [name] NOVOLIN 70/30 (70-30) 100 UNIT/ML injection Inject 15 Units into the skin 2 (two) times daily with a meal. Reported on 03/06/2015    . Pediatric Multivitamins-Iron (FLINTSTONES PLUS IRON) chewable tablet Chew 1 tablet by mouth 2 (two) times daily.    . Probiotic Product ([employer] PROBIOTIC PO) Take 1 capsule by mouth daily. Reported on 03/06/2015    . Vortioxetine HBr (TRINTELLIX) 20 MG TABS Take 20 mg by mouth daily. Reported on 03/06/2015     No current facility-administered medications for this visit.     OBJECTIVE:  Middle-aged white woman Who appears stated age 30:   10/02/15 0906  BP: (!) 152/58  Pulse: 69  Resp: 18  Temp: 97.8 F (36.6 C)     Body mass index is 31.81 kg/m.    ECOG FS:1 - Symptomatic but completely ambulatory  Sclerae unicteric, EOMs intact Oropharynx clear and moist No cervical or supraclavicular adenopathy Lungs no rales or rhonchi Heart regular rate and rhythm Abd soft, obese, nontender, positive bowel sounds MSK no focal spinal tenderness, no upper extremity lymphedema Neuro: nonfocal, well oriented, appropriate affect Breasts:  Deferred    LAB RESULTS:  CMP     Component Value Date/Time   NA 142 09/18/2015 1119   K 4.4 09/18/2015 1119   CL 107 07/21/2015 2337   CO2 26 09/18/2015 1119   GLUCOSE 180 (H) 09/18/2015 1119   BUN 34.1 (H) 09/18/2015 1119   CREATININE 1.2 (H) 09/18/2015 1119   CALCIUM 9.3 09/18/2015 1119   PROT 6.5 09/18/2015 1119   ALBUMIN 3.5 09/18/2015 1119   AST 25 09/18/2015 1119   ALT 22 09/18/2015 1119   ALKPHOS 84 09/18/2015 1119   BILITOT 0.72 09/18/2015 1119   GFRNONAA 54 (L) 07/21/2015  [place] (L) 02/18/2013 0827   GFRAA >60 07/21/2015 2337   GFRAA 63 02/18/2013 0827    INo results found for: SPEP, UPEP  Lab Results  Component Value Date   WBC 6.0 10/02/2015   NEUTROABS 4.2 10/02/2015   HGB 9.9 (L) 10/02/2015   HCT 29.3 (L) 10/02/2015   MCV 99.7 10/02/2015   PLT 183 10/02/2015      Chemistry      Component Value Date/Time   NA 142 09/18/2015 1119   K 4.4 09/18/2015 1119   CL 107 07/21/2015 2337   CO2 26 09/18/2015 1119   BUN 34.1 (H) 09/18/2015 1119   CREATININE 1.2 (H) 09/18/2015 1119      Component Value Date/Time   CALCIUM 9.3 09/18/2015 1119   ALKPHOS 84 09/18/2015 1119   AST 25 09/18/2015 1119   ALT 22 09/18/2015 1119   BILITOT 0.72 09/18/2015 1119       No results found for: LABCA2  No components found for: LABCA125  No results for input(s): INR in the last 168 hours.  Urinalysis    Component Value Date/Time   COLORURINE YELLOW 07/21/2015 [place] 07/21/2015 2328   LABSPEC 1.020 07/21/2015 2328   PHURINE 6.0 07/21/2015 2328   GLUCOSEU NEGATIVE 07/21/2015 2328   HGBUR TRACE (A) 07/21/2015 2328   BILIRUBINUR NEGATIVE 07/21/2015 2328   BILIRUBINUR neg 06/29/2013 1017   KETONESUR NEGATIVE 07/21/2015 2328   PROTEINUR 30 (A) 07/21/2015 2328   UROBILINOGEN negative 06/29/2013 1017   NITRITE NEGATIVE 07/21/2015 2328   LEUKOCYTESUR NEGATIVE 07/21/2015 2328    STUDIES: No results found.  ASSESSMENT: 65 y.o.  [place] woman status post right breast upper outer quadrant and right axillary lymph node biopsy 07/19/2014 for a cT2  pN1, stage IIB  invasive ductal carcinoma, grade 2, estrogen and progesterone receptor negative, with an MIB-1 of 90%, and HER-2 amplified  (1) neoadjuvant chemotherapy started 08/08/2014, consisting of carboplatin, docetaxel, trastuzumab and pertuzumab given every 3 weeks 6, completed 11/21/2014  (a) breast MRI after initial 3 cycles shows a significant response  (b) breast MRI after 6 cycles shows a complete radiologic response   (2) trastuzumab continued through 10/02/2015 (to end of capecitabine therapy)  (a) echo 02/13/2015 shows an EF of 60-65%  (b)  Echo 05/09/2015 shows an ejection fraction of 60-65%  (c) echocardiogram 06/27/2015 shows an ejection fraction of 55-60%.  (3) right lumpectomy with sentinel lymph node biopsy on 12/27/14 showed a residual  ypT2 ypN1a, stage IIB invasive ductal carcinoma, grade 2, with repeat estrogen and progesterone receptors again negative  (4) status post right oncoplastic surgery (with left reduction mammoplasty) 01/06/2015 showing in the right breast multiple foci of intralymphatic carcinoma in random sections  (5)  Right modified radical mastectomy 02/15/2014 showed no residual invasive disease; there was DCIS but margins were negative; all 15 axillary lymph nodes were clear; ypTis ypN0  (a)  The patient has decided against reconstruction.  (6)  Postmastectomy radiation completed 05/18/2015 with capecitabine sensitization  (7) adjuvant capecitabine continued through 09/27/2015  (8) hepatic steatosis with increased fibrosis score (at risk for cirrhosis)  (9) thyroid biopsy (left lobe inferiorly) 2 shows benign tissue 06/28/2015   PLAN: [name] has completed her "year of trastuzumab" and is very eager to have her port removed. I have alerted Dr. Barry Dienes regarding this.   She also completed her adjuvant treatment with capecitabine  -- she tolerated that well, with no palmar plantar dysesthesia, mouth sores, or  significant diarrhea.  She is now focusing more on "normal" issues such as her right knee and left shoulder arthritis. She has signed up for a 5K walk in the fall and 4 the [place] [facility] program in January 2018.  She is planning to have not breast reconstruction but a "straightening out of the scar" just before Thanksgiving this year  I'm going to see her again in mid November, before her surgical procedure, and she will have a PET scan prior to that visit, which will be our new baseline. Assuming there is no evidence of active disease I will start seeing her on a 3-6 months basis for the next year.  She has a good understanding of this plan. She agrees with it. She will call with any problems that may develop before her next visit.  Chauncey Cruel, MD   10/02/2015 9:31 AM

## 2015-10-02 ENCOUNTER — Ambulatory Visit: Payer: BLUE CROSS/BLUE SHIELD

## 2015-10-02 ENCOUNTER — Encounter: Payer: Self-pay | Admitting: *Deleted

## 2015-10-02 ENCOUNTER — Ambulatory Visit (HOSPITAL_BASED_OUTPATIENT_CLINIC_OR_DEPARTMENT_OTHER): Payer: BLUE CROSS/BLUE SHIELD | Admitting: Oncology

## 2015-10-02 ENCOUNTER — Other Ambulatory Visit (HOSPITAL_BASED_OUTPATIENT_CLINIC_OR_DEPARTMENT_OTHER): Payer: BLUE CROSS/BLUE SHIELD

## 2015-10-02 ENCOUNTER — Other Ambulatory Visit: Payer: Self-pay

## 2015-10-02 ENCOUNTER — Ambulatory Visit (HOSPITAL_BASED_OUTPATIENT_CLINIC_OR_DEPARTMENT_OTHER): Payer: BLUE CROSS/BLUE SHIELD

## 2015-10-02 ENCOUNTER — Telehealth: Payer: Self-pay | Admitting: Oncology

## 2015-10-02 VITALS — BP 152/58 | HR 69 | Temp 97.8°F | Resp 18 | Ht 69.0 in | Wt 215.4 lb

## 2015-10-02 DIAGNOSIS — Z170 Estrogen receptor positive status [ER+]: Secondary | ICD-10-CM | POA: Diagnosis not present

## 2015-10-02 DIAGNOSIS — C773 Secondary and unspecified malignant neoplasm of axilla and upper limb lymph nodes: Secondary | ICD-10-CM

## 2015-10-02 DIAGNOSIS — C50411 Malignant neoplasm of upper-outer quadrant of right female breast: Secondary | ICD-10-CM

## 2015-10-02 DIAGNOSIS — K760 Fatty (change of) liver, not elsewhere classified: Secondary | ICD-10-CM

## 2015-10-02 DIAGNOSIS — Z95828 Presence of other vascular implants and grafts: Secondary | ICD-10-CM

## 2015-10-02 DIAGNOSIS — Z5112 Encounter for antineoplastic immunotherapy: Secondary | ICD-10-CM | POA: Diagnosis not present

## 2015-10-02 DIAGNOSIS — C50911 Malignant neoplasm of unspecified site of right female breast: Secondary | ICD-10-CM

## 2015-10-02 DIAGNOSIS — C50919 Malignant neoplasm of unspecified site of unspecified female breast: Secondary | ICD-10-CM

## 2015-10-02 LAB — CBC WITH DIFFERENTIAL/PLATELET
BASO%: 0.2 % (ref 0.0–2.0)
Basophils Absolute: 0 10*3/uL (ref 0.0–0.1)
EOS%: 2.2 % (ref 0.0–7.0)
Eosinophils Absolute: 0.1 10*3/uL (ref 0.0–0.5)
HCT: 29.3 % — ABNORMAL LOW (ref 34.8–46.6)
HGB: 9.9 g/dL — ABNORMAL LOW (ref 11.6–15.9)
LYMPH%: 17.5 % (ref 14.0–49.7)
MCH: 33.7 pg (ref 25.1–34.0)
MCHC: 33.8 g/dL (ref 31.5–36.0)
MCV: 99.7 fL (ref 79.5–101.0)
MONO#: 0.6 10*3/uL (ref 0.1–0.9)
MONO%: 9.7 % (ref 0.0–14.0)
NEUT#: 4.2 10*3/uL (ref 1.5–6.5)
NEUT%: 70.4 % (ref 38.4–76.8)
Platelets: 183 10*3/uL (ref 145–400)
RBC: 2.94 10*6/uL — ABNORMAL LOW (ref 3.70–5.45)
RDW: 17.5 % — ABNORMAL HIGH (ref 11.2–14.5)
WBC: 6 10*3/uL (ref 3.9–10.3)
lymph#: 1.1 10*3/uL (ref 0.9–3.3)

## 2015-10-02 LAB — COMPREHENSIVE METABOLIC PANEL
ALT: 20 U/L (ref 0–55)
AST: 18 U/L (ref 5–34)
Albumin: 3.3 g/dL — ABNORMAL LOW (ref 3.5–5.0)
Alkaline Phosphatase: 88 U/L (ref 40–150)
Anion Gap: 9 mEq/L (ref 3–11)
BUN: 34.8 mg/dL — ABNORMAL HIGH (ref 7.0–26.0)
CO2: 24 mEq/L (ref 22–29)
Calcium: 9 mg/dL (ref 8.4–10.4)
Chloride: 108 mEq/L (ref 98–109)
Creatinine: 1.2 mg/dL — ABNORMAL HIGH (ref 0.6–1.1)
EGFR: 49 mL/min/{1.73_m2} — ABNORMAL LOW (ref >90)
Glucose: 177 mg/dl — ABNORMAL HIGH (ref 70–140)
Potassium: 4.1 mEq/L (ref 3.5–5.1)
Sodium: 141 mEq/L (ref 136–145)
Total Bilirubin: 0.81 mg/dL (ref 0.20–1.20)
Total Protein: 6.3 g/dL — ABNORMAL LOW (ref 6.4–8.3)

## 2015-10-02 MED ORDER — SODIUM CHLORIDE 0.9 % IV SOLN
Freq: Once | INTRAVENOUS | Status: AC
  Administered 2015-10-02: 10:00:00 via INTRAVENOUS

## 2015-10-02 MED ORDER — HEPARIN SOD (PORK) LOCK FLUSH 100 UNIT/ML IV SOLN
500.0000 [IU] | Freq: Once | INTRAVENOUS | Status: AC | PRN
  Administered 2015-10-02: 500 [IU]
  Filled 2015-10-02: qty 5

## 2015-10-02 MED ORDER — SODIUM CHLORIDE 0.9 % IJ SOLN
10.0000 mL | INTRAMUSCULAR | Status: DC | PRN
  Administered 2015-10-02: 10 mL via INTRAVENOUS
  Filled 2015-10-02: qty 10

## 2015-10-02 MED ORDER — DIPHENHYDRAMINE HCL 25 MG PO CAPS
ORAL_CAPSULE | ORAL | Status: AC
  Filled 2015-10-02: qty 1

## 2015-10-02 MED ORDER — TRASTUZUMAB CHEMO 150 MG IV SOLR
6.0000 mg/kg | Freq: Once | INTRAVENOUS | Status: AC
  Administered 2015-10-02: 567 mg via INTRAVENOUS
  Filled 2015-10-02: qty 27

## 2015-10-02 MED ORDER — ACETAMINOPHEN 325 MG PO TABS
ORAL_TABLET | ORAL | Status: AC
  Filled 2015-10-02: qty 2

## 2015-10-02 MED ORDER — DIPHENHYDRAMINE HCL 25 MG PO CAPS
25.0000 mg | ORAL_CAPSULE | Freq: Once | ORAL | Status: AC
  Administered 2015-10-02: 25 mg via ORAL

## 2015-10-02 MED ORDER — ACETAMINOPHEN 325 MG PO TABS
650.0000 mg | ORAL_TABLET | Freq: Once | ORAL | Status: AC
  Administered 2015-10-02: 650 mg via ORAL

## 2015-10-02 MED ORDER — SODIUM CHLORIDE 0.9 % IJ SOLN
10.0000 mL | INTRAMUSCULAR | Status: DC | PRN
  Administered 2015-10-02: 10 mL
  Filled 2015-10-02: qty 10

## 2015-10-02 NOTE — Telephone Encounter (Signed)
appt made and avs printed. PET to be scheduled by central radiology

## 2015-10-03 ENCOUNTER — Other Ambulatory Visit: Payer: Self-pay | Admitting: General Surgery

## 2015-10-03 LAB — HEPATITIS C ANTIBODY: Hep C Virus Ab: <0.1 s/co ratio (ref 0.0–0.9)

## 2015-10-04 ENCOUNTER — Telehealth: Payer: Self-pay

## 2015-10-04 DIAGNOSIS — C50411 Malignant neoplasm of upper-outer quadrant of right female breast: Secondary | ICD-10-CM

## 2015-10-04 NOTE — Telephone Encounter (Signed)
Pt called that Dr Jana Hakim has not notified Dr Barry Dienes of port removal. Per OV note referral for surgery made. [facility] called.

## 2015-10-09 ENCOUNTER — Encounter (HOSPITAL_COMMUNITY): Payer: Self-pay | Admitting: *Deleted

## 2015-10-09 NOTE — Progress Notes (Signed)
Spoke with pt for pre-op call. Pt states she sees Dr. Aundra Dubin due to the medicine she is taking for chemo. Does have a heart murmur, but it has never given her any issues. Pt denies chest pain or sob. Pt is diabetic. States her last A1C was within the last 6 months and it was 7.5. States her fasting blood sugar is usually around 120. Instructed pt to take 70% of her regular dose of Novolin 70/30 this evening and none in the AM. Instructed pt to check blood sugar in AM when she gets up and every 2 hours prior to leaving for hospital. If blood sugar is 70 or below, treat with 1/2 cup of clear juice (apple or cranberry) and recheck blood sugar 15 minutes after drinking juice. If blood sugar continues to be 70 or below, call the Short Stay department and ask to speak to a nurse. Pt voiced understanding.

## 2015-10-09 NOTE — H&P (Signed)
Darlene [name] is an 65 y.o. female.   Chief Complaint: unneeded port a cath HPI:  Pt is a 64 yo F who is s/p surgery and chemotherapy for right breast cancer.  She has completed her course of therapy and desires port removal.    Past Medical History:  Diagnosis Date  . Anemia    hx of  . Anxiety   . Arthritis    in fingers, shoulders  . Balance problem   . Breast cancer ([place])   . Breast cancer of upper-outer quadrant of right female breast ([place]) 07/22/2014  . Clumsiness   . Constipation   . Depression   . Diabetes mellitus without complication ([place])    69+ years  . Eating disorder    binge eating  . Epistaxis 08/26/2014  . Fatty liver   . HCAP (healthcare-associated pneumonia) 12/18/2014  . Headache   . Heart murmur    never had any problems  . Hyperlipidemia   . Hypertension   . Neuromuscular disorder ([place])    diabetic neuropathy  . Obesity   . Obstructive sleep apnea on CPAP    uses cpap nightly  . Ovarian cyst rupture    possible  . Pneumonia   . Pneumonia 11/2014  . Radiation 04/05/15-05/19/15   right chest wall 50.4 Gy, mastectomy scar 10 Gy  . Thyroid nodule 06/2014    Past Surgical History:  Procedure Laterality Date  . BIOPSY THYROID Bilateral 06/2014   2 nodules - Benign   . BREAST LUMPECTOMY WITH RADIOACTIVE SEED AND SENTINEL LYMPH NODE BIOPSY Right 12/27/2014   Procedure: BREAST LUMPECTOMY WITH RADIOACTIVE SEED AND SENTINEL LYMPH NODE BIOPSY;  Surgeon: Stark Klein, MD;  Location: [place];  Service: General;  Laterality: Right;  . BREAST REDUCTION SURGERY Bilateral 01/06/2015   Procedure: BILATERAL BREAST REDUCTION, RIGHT ONCOPLASTIC RECONSTRUCTION),LEFT BREAST REDUCTION FOR SYMMETRY;  Surgeon: [name], MD;  Location: [place];  Service: Plastics;  Laterality: Bilateral;  . BREATH TEK H PYLORI N/A 09/15/2012   Procedure: BREATH TEK H PYLORI;  Surgeon: [place] Earls, MD;  Location: [facility];  Service: General;  Laterality: N/A;   . BUNIONECTOMY Left 12/2013  . CARPAL TUNNEL RELEASE Right   . CARPAL TUNNEL RELEASE Left   . DUPUYTREN CONTRACTURE RELEASE    . GASTRIC ROUX-EN-Y N/A 11/02/2012   Procedure: LAPAROSCOPIC ROUX-EN-Y GASTRIC;  Surgeon: [place] Earls, MD;  [facility];  Service: General;  Laterality: N/A;  . LAPAROSCOPIC APPENDECTOMY N/A 07/22/2015   Procedure: APPENDECTOMY LAPAROSCOPIC;  Surgeon: Michael Boston, MD;  [facility];  Service: General;  Laterality: N/A;  . MASTECTOMY Right 02/15/2015   modified  . MODIFIED MASTECTOMY Right 02/15/2015   Procedure: RIGHT MODIFIED RADICAL MASTECTOMY;  Surgeon: Stark Klein, MD;  Location: [place];  Service: General;  Laterality: Right;  . PORTACATH PLACEMENT Left 08/02/2014   Procedure: INSERTION PORT-A-CATH;  Surgeon: Stark Klein, MD;  Location: [place];  Service: General;  Laterality: Left;  . ROTATOR CUFF REPAIR Right 2005  . TOE SURGERY  1970s   bone spur  . TRIGGER FINGER RELEASE     x3    Family History  Problem Relation Age of Onset  . CVA Mother   . Diabetes Father   . Heart failure Father   . Hypertension Sister   . Diabetes Brother   . Breast cancer Paternal Grandmother   . Diabetes Paternal Uncle    Social History:  reports that she has never  smoked. She has never used smokeless tobacco. She reports that she drinks alcohol. She reports that she does not use drugs.  Allergies:  Allergies  Allergen Reactions  . Nsaids Other (See Comments)    H/o gastric bypass - avoid NSAIDs  . Peanuts [Peanut Oil] Itching    "Large amounts"  . Tape Hives and Rash    No prescriptions prior to admission.    No results found for this or any previous visit (from the past 48 hour(s)). No results found.  Review of Systems  All other systems reviewed and are negative.   Last menstrual period 10/22/2007. Physical Exam  Constitutional: She is oriented to person, place, and time. She appears well-developed and well-nourished. No  distress.  HENT:  Head: Normocephalic and atraumatic.  Eyes: Conjunctivae are normal. Pupils are equal, round, and reactive to light. No scleral icterus.  Neck: Normal range of motion. Neck supple. No thyromegaly present.  Cardiovascular: Normal rate.   Respiratory: Effort normal. No respiratory distress.  GI: Soft. She exhibits no distension.  Musculoskeletal: She exhibits no edema or tenderness.  Lymphadenopathy:    She has no cervical adenopathy.  Neurological: She is alert and oriented to person, place, and time. Coordination normal.  Skin: Skin is warm and dry. No rash noted. She is not diaphoretic. No erythema. No pallor.  Psychiatric: She has a normal mood and affect. Her behavior is normal. Judgment and thought content normal.     Assessment/Plan Unneeded port a cath for right breast cancer.  Plan removal. Discussed risks and benefits for surgery.    Stark Klein, MD 10/09/2015, 11:06 PM

## 2015-10-10 ENCOUNTER — Ambulatory Visit (HOSPITAL_COMMUNITY): Payer: BLUE CROSS/BLUE SHIELD | Admitting: Anesthesiology

## 2015-10-10 ENCOUNTER — Encounter (HOSPITAL_COMMUNITY)
Admission: RE | Disposition: A | Discharge status: Home / Self Care | Payer: Self-pay | Source: Ambulatory Visit | Attending: General Surgery

## 2015-10-10 ENCOUNTER — Ambulatory Visit (HOSPITAL_COMMUNITY)
Admission: RE | Admit: 2015-10-10 | Discharge: 2015-10-10 | Disposition: A | Discharge status: Home / Self Care | Payer: BLUE CROSS/BLUE SHIELD | Source: Ambulatory Visit | Attending: General Surgery | Admitting: General Surgery

## 2015-10-10 ENCOUNTER — Encounter (HOSPITAL_COMMUNITY): Payer: Self-pay | Admitting: *Deleted

## 2015-10-10 DIAGNOSIS — Z452 Encounter for adjustment and management of vascular access device: Secondary | ICD-10-CM | POA: Diagnosis present

## 2015-10-10 DIAGNOSIS — I10 Essential (primary) hypertension: Secondary | ICD-10-CM | POA: Insufficient documentation

## 2015-10-10 DIAGNOSIS — Z6831 Body mass index (BMI) 31.0-31.9, adult: Secondary | ICD-10-CM | POA: Diagnosis not present

## 2015-10-10 DIAGNOSIS — E1140 Type 2 diabetes mellitus with diabetic neuropathy, unspecified: Secondary | ICD-10-CM | POA: Insufficient documentation

## 2015-10-10 DIAGNOSIS — Z9221 Personal history of antineoplastic chemotherapy: Secondary | ICD-10-CM | POA: Diagnosis not present

## 2015-10-10 DIAGNOSIS — F329 Major depressive disorder, single episode, unspecified: Secondary | ICD-10-CM | POA: Diagnosis not present

## 2015-10-10 DIAGNOSIS — Z9884 Bariatric surgery status: Secondary | ICD-10-CM | POA: Insufficient documentation

## 2015-10-10 DIAGNOSIS — Z79899 Other long term (current) drug therapy: Secondary | ICD-10-CM | POA: Insufficient documentation

## 2015-10-10 DIAGNOSIS — G4733 Obstructive sleep apnea (adult) (pediatric): Secondary | ICD-10-CM | POA: Insufficient documentation

## 2015-10-10 DIAGNOSIS — Z853 Personal history of malignant neoplasm of breast: Secondary | ICD-10-CM | POA: Insufficient documentation

## 2015-10-10 DIAGNOSIS — Z9011 Acquired absence of right breast and nipple: Secondary | ICD-10-CM | POA: Diagnosis not present

## 2015-10-10 DIAGNOSIS — Z794 Long term (current) use of insulin: Secondary | ICD-10-CM | POA: Insufficient documentation

## 2015-10-10 HISTORY — DX: Constipation, unspecified: K59.00

## 2015-10-10 HISTORY — DX: Headache: R51

## 2015-10-10 HISTORY — DX: Fatty (change of) liver, not elsewhere classified: K76.0

## 2015-10-10 HISTORY — PX: PORT-A-CATH REMOVAL: SHX5289

## 2015-10-10 LAB — BASIC METABOLIC PANEL
Anion gap: 8 (ref 5–15)
BUN: 17 mg/dL (ref 6–20)
CO2: 24 mmol/L (ref 22–32)
Calcium: 9.2 mg/dL (ref 8.9–10.3)
Chloride: 107 mmol/L (ref 101–111)
Creatinine, Ser: 1.13 mg/dL — ABNORMAL HIGH (ref 0.44–1.00)
GFR calc Af Amer: 59 mL/min — ABNORMAL LOW (ref >60)
GFR calc non Af Amer: 51 mL/min — ABNORMAL LOW (ref >60)
Glucose, Bld: 162 mg/dL — ABNORMAL HIGH (ref 65–99)
Potassium: 4.2 mmol/L (ref 3.5–5.1)
Sodium: 139 mmol/L (ref 135–145)

## 2015-10-10 LAB — GLUCOSE, CAPILLARY
Glucose-Capillary: 168 mg/dL — ABNORMAL HIGH (ref 65–99)
Glucose-Capillary: 148 mg/dL — ABNORMAL HIGH (ref 65–99)

## 2015-10-10 LAB — CBC
HCT: 31.8 % — ABNORMAL LOW (ref 36.0–46.0)
Hemoglobin: 10.5 g/dL — ABNORMAL LOW (ref 12.0–15.0)
MCH: 33.5 pg (ref 26.0–34.0)
MCHC: 33 g/dL (ref 30.0–36.0)
MCV: 101.6 fL — ABNORMAL HIGH (ref 78.0–100.0)
Platelets: 203 10*3/uL (ref 150–400)
RBC: 3.13 MIL/uL — ABNORMAL LOW (ref 3.87–5.11)
RDW: 16.3 % — ABNORMAL HIGH (ref 11.5–15.5)
WBC: 5.4 10*3/uL (ref 4.0–10.5)

## 2015-10-10 SURGERY — REMOVAL PORT-A-CATH
Anesthesia: Monitor Anesthesia Care | Site: Chest | Laterality: Left

## 2015-10-10 MED ORDER — FENTANYL CITRATE (PF) 100 MCG/2ML IJ SOLN
INTRAMUSCULAR | Status: AC
  Filled 2015-10-10: qty 2

## 2015-10-10 MED ORDER — LACTATED RINGERS IV SOLN
INTRAVENOUS | Status: DC
  Administered 2015-10-10 (×2): via INTRAVENOUS

## 2015-10-10 MED ORDER — LIDOCAINE HCL (CARDIAC) 20 MG/ML IV SOLN
INTRAVENOUS | Status: DC | PRN
  Administered 2015-10-10: 40 mg via INTRATRACHEAL

## 2015-10-10 MED ORDER — GLYCOPYRROLATE 0.2 MG/ML IV SOSY
PREFILLED_SYRINGE | INTRAVENOUS | Status: AC
  Filled 2015-10-10: qty 3

## 2015-10-10 MED ORDER — PROPOFOL 500 MG/50ML IV EMUL
INTRAVENOUS | Status: DC | PRN
  Administered 2015-10-10: 25 ug/kg/min via INTRAVENOUS

## 2015-10-10 MED ORDER — 0.9 % SODIUM CHLORIDE (POUR BTL) OPTIME
TOPICAL | Status: DC | PRN
  Administered 2015-10-10: 1000 mL

## 2015-10-10 MED ORDER — MIDAZOLAM HCL 2 MG/2ML IJ SOLN
INTRAMUSCULAR | Status: DC | PRN
  Administered 2015-10-10 (×2): 1 mg via INTRAVENOUS

## 2015-10-10 MED ORDER — GLYCOPYRROLATE 0.2 MG/ML IJ SOLN
INTRAMUSCULAR | Status: DC | PRN
  Administered 2015-10-10: 0.2 mg via INTRAVENOUS

## 2015-10-10 MED ORDER — LIDOCAINE HCL 1 % IJ SOLN
INTRAMUSCULAR | Status: DC | PRN
  Administered 2015-10-10: 10 mL

## 2015-10-10 MED ORDER — LIDOCAINE HCL (PF) 1 % IJ SOLN
INTRAMUSCULAR | Status: AC
  Filled 2015-10-10: qty 30

## 2015-10-10 MED ORDER — CEFAZOLIN SODIUM-DEXTROSE 2-4 GM/100ML-% IV SOLN
2.0000 g | INTRAVENOUS | Status: AC
  Administered 2015-10-10: 2 g via INTRAVENOUS
  Filled 2015-10-10: qty 100

## 2015-10-10 MED ORDER — CHLORHEXIDINE GLUCONATE CLOTH 2 % EX PADS: 6.0000 | MEDICATED_PAD | Freq: Once | CUTANEOUS | Status: DC

## 2015-10-10 MED ORDER — BUPIVACAINE-EPINEPHRINE (PF) 0.25% -1:200000 IJ SOLN
INTRAMUSCULAR | Status: AC
  Filled 2015-10-10: qty 30

## 2015-10-10 MED ORDER — PROPOFOL 10 MG/ML IV BOLUS
INTRAVENOUS | Status: AC
  Filled 2015-10-10: qty 40

## 2015-10-10 MED ORDER — PROPOFOL 10 MG/ML IV BOLUS
INTRAVENOUS | Status: DC | PRN
  Administered 2015-10-10: 50 mg via INTRAVENOUS
  Administered 2015-10-10: 25 mg via INTRAVENOUS
  Administered 2015-10-10: 20 mg via INTRAVENOUS
  Administered 2015-10-10 (×2): 40 mg via INTRAVENOUS

## 2015-10-10 MED ORDER — MIDAZOLAM HCL 2 MG/2ML IJ SOLN
INTRAMUSCULAR | Status: AC
  Filled 2015-10-10: qty 2

## 2015-10-10 MED ORDER — LIDOCAINE 2% (20 MG/ML) 5 ML SYRINGE
INTRAMUSCULAR | Status: AC
  Filled 2015-10-10: qty 5

## 2015-10-10 MED ORDER — FENTANYL CITRATE (PF) 100 MCG/2ML IJ SOLN
INTRAMUSCULAR | Status: DC | PRN
  Administered 2015-10-10 (×2): 25 ug via INTRAVENOUS
  Administered 2015-10-10: 50 ug via INTRAVENOUS

## 2015-10-10 MED ORDER — ONDANSETRON HCL 4 MG/2ML IJ SOLN
INTRAMUSCULAR | Status: AC
  Filled 2015-10-10: qty 2

## 2015-10-10 MED ORDER — OXYCODONE HCL 5 MG PO TABS: 5.0000 mg | ORAL_TABLET | Freq: Four times a day (QID) | ORAL | 0 refills | Status: DC | PRN

## 2015-10-10 MED ORDER — ONDANSETRON HCL 4 MG/2ML IJ SOLN
INTRAMUSCULAR | Status: DC | PRN
  Administered 2015-10-10: 4 mg via INTRAVENOUS

## 2015-10-10 SURGICAL SUPPLY — 33 items
BLADE SURG 15 STRL LF DISP TIS (BLADE) ×1 IMPLANT
BLADE SURG 15 STRL SS (BLADE) ×1
CHLORAPREP W/TINT 10.5 ML (MISCELLANEOUS) ×2 IMPLANT
CHLORAPREP W/TINT 26ML (MISCELLANEOUS) ×2 IMPLANT
COVER SURGICAL LIGHT HANDLE (MISCELLANEOUS) ×2 IMPLANT
DECANTER SPIKE VIAL GLASS SM (MISCELLANEOUS) IMPLANT
DRAPE CHEST BREAST 15X10 FENES (DRAPES) ×2 IMPLANT
DRAPE LAPAROTOMY T 98X78 PEDS (DRAPES) IMPLANT
DRAPE UTILITY XL STRL (DRAPES) ×2 IMPLANT
ELECT CAUTERY BLADE 6.4 (BLADE) ×2 IMPLANT
ELECT REM PT RETURN 9FT ADLT (ELECTROSURGICAL) ×2
ELECTRODE REM PT RTRN 9FT ADLT (ELECTROSURGICAL) ×1 IMPLANT
GAUZE SPONGE 4X4 16PLY XRAY LF (GAUZE/BANDAGES/DRESSINGS) ×2 IMPLANT
GLOVE BIO SURGEON STRL SZ 6 (GLOVE) ×4 IMPLANT
GLOVE BIOGEL PI IND STRL 6.5 (GLOVE) ×1 IMPLANT
GLOVE BIOGEL PI INDICATOR 6.5 (GLOVE) ×1
GOWN STRL REUS W/ TWL LRG LVL3 (GOWN DISPOSABLE) ×1 IMPLANT
GOWN STRL REUS W/TWL 2XL LVL3 (GOWN DISPOSABLE) ×2 IMPLANT
GOWN STRL REUS W/TWL LRG LVL3 (GOWN DISPOSABLE) ×1
KIT BASIN OR (CUSTOM PROCEDURE TRAY) ×2 IMPLANT
KIT ROOM TURNOVER OR (KITS) ×2 IMPLANT
LIQUID BAND (GAUZE/BANDAGES/DRESSINGS) ×2 IMPLANT
NEEDLE HYPO 25GX1X1/2 BEV (NEEDLE) ×2 IMPLANT
NS IRRIG 1000ML POUR BTL (IV SOLUTION) ×2 IMPLANT
PACK SURGICAL SETUP 50X90 (CUSTOM PROCEDURE TRAY) ×2 IMPLANT
PAD ARMBOARD 7.5X6 YLW CONV (MISCELLANEOUS) ×4 IMPLANT
PENCIL BUTTON HOLSTER BLD 10FT (ELECTRODE) ×2 IMPLANT
SUT MON AB 4-0 PC3 18 (SUTURE) ×2 IMPLANT
SUT VIC AB 3-0 SH 27 (SUTURE) ×1
SUT VIC AB 3-0 SH 27X BRD (SUTURE) ×1 IMPLANT
SYR CONTROL 10ML LL (SYRINGE) ×2 IMPLANT
TOWEL OR 17X24 6PK STRL BLUE (TOWEL DISPOSABLE) ×2 IMPLANT
TOWEL OR 17X26 10 PK STRL BLUE (TOWEL DISPOSABLE) ×2 IMPLANT

## 2015-10-10 NOTE — Anesthesia Postprocedure Evaluation (Signed)
Anesthesia Post Note  Patient: Darlene [name]  Procedure(s) Performed: Procedure(s) (LRB): PORT REMOVAL (Left)  Patient location during evaluation: PACU Anesthesia Type: MAC Level of consciousness: awake and alert Pain management: pain level controlled Vital Signs Assessment: post-procedure vital signs reviewed and stable Respiratory status: spontaneous breathing, nonlabored ventilation, respiratory function stable and patient connected to nasal cannula oxygen Cardiovascular status: stable and blood pressure returned to baseline Anesthetic complications: no    Last Vitals:  Vitals:   10/10/15 1230 10/10/15 1239  BP: (!) 174/77 (!) 150/79  Pulse: 65 (!) 57  Resp: 18 18  Temp: 36.8 C     Last Pain:  Vitals:   10/10/15 1239  TempSrc:   PainSc: 0-No pain                 Tiajuana Amass

## 2015-10-10 NOTE — Anesthesia Preprocedure Evaluation (Addendum)
Anesthesia Evaluation  Patient identified by MRN, date of birth, ID band Patient awake    Reviewed: Allergy & Precautions, NPO status , Patient's Chart, lab work & pertinent test results  Airway Mallampati: I  TM Distance: >3 FB Neck ROM: Full    Dental  (+) Teeth Intact, Dental Advisory Given   Pulmonary sleep apnea and Continuous Positive Airway Pressure Ventilation ,    breath sounds clear to auscultation       Cardiovascular hypertension, Pt. on medications  Rhythm:Regular Rate:Normal  06/2015: Left ventricle: LV global longitudinal strain is -14% but appears   inaccurate due to poor tracking on speckled doppler. The cavity   size was normal. There was mild concentric hypertrophy. Systolic   function was normal. The estimated ejection fraction was in the   range of 55% to 60%. Wall motion was normal; there were no   regional wall motion abnormalities. The study is not technically   sufficient to allow evaluation of LV diastolic function. - Aortic valve: Moderate thickening and calcification, consistent   with sclerosis. - Mitral valve: Calcified annulus. There was trivial regurgitation. - Tricuspid valve: There was trivial regurgitation.   Neuro/Psych Anxiety Depression    GI/Hepatic negative GI ROS, Neg liver ROS,   Endo/Other  diabetes, Well Controlled, Type 2, Insulin DependentMorbid obesity  Renal/GU Renal InsufficiencyRenal disease     Musculoskeletal  (+) Arthritis ,   Abdominal   Peds  Hematology  (+) anemia ,   Anesthesia Other Findings   Reproductive/Obstetrics                            Lab Results  Component Value Date   WBC 6.0 10/02/2015   HGB 9.9 (L) 10/02/2015   HCT 29.3 (L) 10/02/2015   MCV 99.7 10/02/2015   PLT 183 10/02/2015   Lab Results  Component Value Date   CREATININE 1.2 (H) 10/02/2015   BUN 34.8 (H) 10/02/2015   NA 141 10/02/2015   K 4.1 10/02/2015   CL 107 07/21/2015   CO2 24 10/02/2015    Anesthesia Physical  Anesthesia Plan  ASA: III  Anesthesia Plan: MAC   Post-op Pain Management:    Induction: Intravenous  Airway Management Planned: Natural Airway, Nasal Cannula and Simple Face Mask  Additional Equipment:   Intra-op Plan:   Post-operative Plan:   Informed Consent: I have reviewed the patients History and Physical, chart, labs and discussed the procedure including the risks, benefits and alternatives for the proposed anesthesia with the patient or authorized representative who has indicated his/her understanding and acceptance.   Dental advisory given  Plan Discussed with: CRNA, Anesthesiologist and Surgeon  Anesthesia Plan Comments:         Anesthesia Quick Evaluation

## 2015-10-10 NOTE — Op Note (Signed)
  PRE-OPERATIVE DIAGNOSIS:  un-needed Port-A-Cath for right breast cancer  POST-OPERATIVE DIAGNOSIS:  Same   PROCEDURE:  Procedure(s):  REMOVAL PORT-A-CATH  SURGEON:  Surgeon(s):  Stark Klein, MD  ANESTHESIA:   MAC + local  EBL:   Minimal  SPECIMEN:  None  Complications : none known  Procedure:   Pt was  identified in the holding area and taken to the operating room where she was placed supine on the operating room table.  MAC anesthesia was induced.  The left upper chest was prepped and draped.  The prior incision was anesthetized with local anesthetic.  The incision was opened with a #15 blade.  The subcutaneous tissue was divided with the cautery.  The port was identified and the capsule opened.  The four 2-0 prolene sutures were removed.  The port was then removed and pressure held on the tract.  The catheter appeared intact without evidence of breakage, length was 24.5 cm.  The wound was inspected for hemostasis, which was achieved with cautery.  The wound was closed with 3-0 vicryl deep dermal interrupted sutures and 4-0 Monocryl running subcuticular suture.  The wound was cleaned, dried, and dressed with dermabond.  The patient was awakened from anesthesia and taken to the PACU in stable condition.  Needle, sponge, and instrument counts are correct.

## 2015-10-10 NOTE — Progress Notes (Signed)
Spoke with dr fitzgerald notifed of pts bp 169/71 no rx instruct pt to take regular bp med whrn she gets home told pt and she understands

## 2015-10-10 NOTE — Transfer of Care (Signed)
Immediate Anesthesia Transfer of Care Note  Patient: Darlene [name]  Procedure(s) Performed: Procedure(s): PORT REMOVAL (Left)  Patient Location: PACU  Anesthesia Type:MAC  Level of Consciousness: awake, alert , oriented and patient cooperative  Airway & Oxygen Therapy: Patient Spontanous Breathing and Patient connected to nasal cannula oxygen  Post-op Assessment: Report given to RN, Post -op Vital signs reviewed and stable, Patient moving all extremities X 4 and Patient able to stick tongue midline  Post vital signs: Reviewed and stable  Last Vitals:  Vitals:   10/10/15 0907  BP: (!) 156/55  Pulse: (!) 54  Resp: 20  Temp: 37 C    Last Pain:  Vitals:   10/10/15 0907  TempSrc: Oral      Patients Stated Pain Goal: 3 (60/15/61 5379)  Complications: No apparent anesthesia complications

## 2015-10-10 NOTE — Interval H&P Note (Signed)
History and Physical Interval Note:  10/10/2015 11:01 AM  Darlene [name]  has presented today for surgery, with the diagnosis of BREAST CANCER  The various methods of treatment have been discussed with the patient and family. After consideration of risks, benefits and other options for treatment, the patient has consented to  Procedure(s): PORT REMOVAL (N/A) as a surgical intervention .  The patient's history has been reviewed, patient examined, no change in status, stable for surgery.  I have reviewed the patient's chart and labs.  Questions were answered to the patient's satisfaction.     BYERLY,FAERA

## 2015-10-11 ENCOUNTER — Encounter (HOSPITAL_COMMUNITY): Payer: Self-pay | Admitting: General Surgery

## 2015-10-12 ENCOUNTER — Encounter: Payer: BLUE CROSS/BLUE SHIELD | Attending: Surgery | Admitting: Dietician

## 2015-10-12 DIAGNOSIS — E669 Obesity, unspecified: Secondary | ICD-10-CM | POA: Diagnosis present

## 2015-10-12 DIAGNOSIS — Z9884 Bariatric surgery status: Secondary | ICD-10-CM | POA: Insufficient documentation

## 2015-10-12 DIAGNOSIS — E119 Type 2 diabetes mellitus without complications: Secondary | ICD-10-CM

## 2015-10-12 DIAGNOSIS — Z9221 Personal history of antineoplastic chemotherapy: Secondary | ICD-10-CM | POA: Diagnosis not present

## 2015-10-12 DIAGNOSIS — Z794 Long term (current) use of insulin: Secondary | ICD-10-CM

## 2015-10-12 NOTE — Patient Instructions (Addendum)
-  Keep bottles of water at work to help encourage you to drink more water -Continue to get up and walk in the afternoons -Continue to keep healthier snacks on hand   -Prepare yourself a little snack mix for the afternoon (nuts, chocolate chips, jerky, popcorn)   -[place] Balanced Breaks  -Listen to your hunger and fullness cues (stop before you get too full) -Consider testing your blood sugar (keep your meter on hand and test if you feel funny)  -Keep a meter at work -Eat protein first, then vegetables, then starch if there's room -Think about ways to increase activity   -Good shoes and inserts for walking!  -Look for a water aerobics class that you are interested in

## 2015-10-12 NOTE — Progress Notes (Signed)
Bariatric Nutrition follow up 3 years post op RYGB  Medical Nutrition Therapy:  Appt start time: 0815 end time:  0915  Primary concerns today: Post-operative Bariatric Surgery Nutrition Management.  Darlene [name] is here today for nutrition counseling. She had RYGB in October 2014 years ago and "it went very well." She states she was then diagnosed with breast cancer 15 months ago and has had a mastectomy and recently finished chemo and radiation. Was told to "eat what I could hold down" during the time when she was sick from cancer treatments. Continues to avoid drinking with meals but has started drinking diet sodas in the afternoons. She would like to learn more about the bariatric nutrition recommendations and reports that she has felt confused about all the nutrition advice she has received for her various illnesses (diabetes, breast cancer, and bariatric surgery). Plans to follow up with Dr. Hassell Done in October and has recently attended bariatric support group. Husband does the grocery shopping and cooking in the house. Darlene [name] states that she tolerates all foods. She reports no dumping syndrome but does struggle with constipation.   Darlene [name] takes 15 units of insulin morning and evening. She does not test her blood sugars but does not feel "low." States that her most recent A1c was 7.5%. Checks her feet daily and recently went to podiatrist. Gets regular eye and dental exams.   TANITA  BODY COMP RESULTS  10/12/15   BMI (kg/m^2) 32.5   Fat Mass (lbs) 97.6   Fat Free Mass (lbs) 122.2   Total Body Water (lbs) 87.8    Preferred Learning Style:   No preference indicated   Learning Readiness:   Ready  24-hr recall: B (AM): small decaf skinny mocha from [employer], Unjury protein shake mixed with almond milk  Snk (AM): [place] or other light [place] yogurt or apple, cheese  L (PM): protein and vegetables from the night before OR protein box from [employer] OR sandwich with meat and 1 piece of  bread Snk at work (PM): crackers or candy bar  Snk at home before dinner (PM): mixed nuts D (PM): pan fried (olive oil) 3 oz steak or pork chop, frozen vegetables   Snk (PM): none  Fluid intake: 20 oz diet Mountain Dew, 16 oz decaf coffee, 10 oz almond milk with Unjury shake, 20 oz water Estimated total protein intake: ~80 grams per day  Medications: see list Supplementation: Flintstones complete 2x a day, Calcium chews 3x a day  CBG monitoring: does not test Average CBG per patient: unknown Last patient reported A1c: 7.5%   Recent physical activity:  Not much due to bunions  Progress Towards Goal(s):  In progress.  Handouts given during visit include:  Phase 3A lean protein foods  Bariatric snack list   Nutritional Diagnosis:  [place]-3.4 Unintentional weight gain As related to inability to comply with bariatric diet recommendations during cancer treatments.  As evidenced by patient report of recent weight gain.    Intervention:  Nutrition counseling provided. Praised patient on her positive attitude and healthy habits she has maintained. Reviewed newest bariatric diet recommendations. Goals:  -Keep bottles of water at work to help encourage you to drink more water -Continue to get up and walk in the afternoons -Continue to keep healthier snacks on hand   -Prepare yourself a little snack mix for the afternoon (nuts, chocolate chips, jerky, popcorn)   -[place] Balanced Breaks  -Listen to your hunger and fullness cues (stop before you get too full) -Consider testing your  blood sugar (keep your meter on hand and test if you feel funny)  -Keep a meter at work -Eat protein first, then vegetables, then starch if there's room -Think about ways to increase activity   -Good shoes and inserts for walking!  -Look for a water aerobics class that you are interested in  Teaching Method Utilized:  Visual Auditory Hands on  Barriers to learning/adherence to lifestyle change:  none  Demonstrated degree of understanding via:  Teach Back   Monitoring/Evaluation:  Dietary intake, exercise, and body weight. Follow up in 1 month.

## 2015-11-13 ENCOUNTER — Other Ambulatory Visit: Payer: Self-pay | Admitting: *Deleted

## 2015-11-13 DIAGNOSIS — C50411 Malignant neoplasm of upper-outer quadrant of right female breast: Secondary | ICD-10-CM

## 2015-11-13 DIAGNOSIS — Z171 Estrogen receptor negative status [ER-]: Secondary | ICD-10-CM

## 2015-11-13 DIAGNOSIS — Z9884 Bariatric surgery status: Secondary | ICD-10-CM

## 2015-11-14 ENCOUNTER — Ambulatory Visit (HOSPITAL_BASED_OUTPATIENT_CLINIC_OR_DEPARTMENT_OTHER)
Admission: RE | Admit: 2015-11-14 | Discharge: 2015-11-14 | Disposition: A | Discharge status: Home / Self Care | Payer: BLUE CROSS/BLUE SHIELD | Source: Ambulatory Visit | Attending: Cardiology | Admitting: Cardiology

## 2015-11-14 ENCOUNTER — Encounter: Payer: Self-pay | Admitting: Nurse Practitioner

## 2015-11-14 ENCOUNTER — Ambulatory Visit (HOSPITAL_COMMUNITY)
Admission: RE | Admit: 2015-11-14 | Discharge: 2015-11-14 | Disposition: A | Discharge status: Home / Self Care | Payer: BLUE CROSS/BLUE SHIELD | Source: Ambulatory Visit | Attending: Cardiology | Admitting: Cardiology

## 2015-11-14 ENCOUNTER — Encounter (HOSPITAL_COMMUNITY): Payer: Self-pay

## 2015-11-14 VITALS — BP 150/70 | HR 61 | Wt 220.5 lb

## 2015-11-14 DIAGNOSIS — C50919 Malignant neoplasm of unspecified site of unspecified female breast: Secondary | ICD-10-CM | POA: Diagnosis present

## 2015-11-14 DIAGNOSIS — C50411 Malignant neoplasm of upper-outer quadrant of right female breast: Secondary | ICD-10-CM | POA: Diagnosis not present

## 2015-11-14 DIAGNOSIS — C773 Secondary and unspecified malignant neoplasm of axilla and upper limb lymph nodes: Secondary | ICD-10-CM | POA: Diagnosis not present

## 2015-11-14 DIAGNOSIS — I5030 Unspecified diastolic (congestive) heart failure: Secondary | ICD-10-CM | POA: Diagnosis not present

## 2015-11-14 DIAGNOSIS — I517 Cardiomegaly: Secondary | ICD-10-CM | POA: Diagnosis not present

## 2015-11-14 NOTE — Progress Notes (Signed)
Patient ID: Darlene [name], female   DOB: [birth year], 65 y.o.   MRN: [id number] Oncologist: Dr Jana Hakim   HPI: Darlene [name] is a 65 year old with history of R breast cancer, right axillary lymph node biopsy 07/19/2014 for a cT2 pN1, stage IIB invasive ductal carcinoma, grade 2, estrogen and progesterone receptor negative, with an MIB-1 of 90%, and HER-2 positive , OSA -> CPAP, bariatric surgery, and DM, referred to cardio-oncology clinic by Dr Jana Hakim  Completed neoadjuvant chemotherapy with carboplatin, docetaxel, trastuzumab and pertuzumab given every 3 weeks 6. Trastuzumab + capecitabine were completed in 9/17.  She had lumpectomy 12/16 and had mastectomy in 1/17.  She has completed radiation.   Today she denies SOB/CP.  Doing ok currently.  BP is elevated.   Echo (7/16) with EF 60-65%, grade II diastolic dysfunction.  Echo (10/16) with EF 66%, GLS -17%. Echo (1/17) with EF 60-65%, grade II diastolic dysfunction, lateral s' 10, GLS -18.8% Echo (4/17) with EF 60-65%, grade II diastolic dysfunction, lateral s' 9.5, strain poorly traced and off axis, normal RV size and systolic function.  Echo (6/17) with EF 55-60%, aortic sclerosis without stenosis, difficult images for strain, probably not accurate.  Echo (10/17) with EF 55-60%, mild LVH, moderate diastolic dysfunction, normal RV size and systolic function.   Review of Systems: All systems reviewed and negative except as per HPI.   Past Medical History:  Diagnosis Date  . Anemia    hx of  . Anxiety   . Arthritis    in fingers, shoulders  . Balance problem   . Breast cancer ([place])   . Breast cancer of upper-outer quadrant of right female breast ([place]) 07/22/2014  . Clumsiness   . Constipation   . Depression   . Diabetes mellitus without complication ([place])    25+ years  . Eating disorder    binge eating  . Epistaxis 08/26/2014  . Fatty liver   . HCAP (healthcare-associated pneumonia) 12/18/2014  . Headache   . Heart murmur    never had any problems  . Hyperlipidemia   . Hypertension   . Neuromuscular disorder ([place])    diabetic neuropathy  . Obesity   . Obstructive sleep apnea on CPAP    uses cpap nightly  . Ovarian cyst rupture    possible  . Pneumonia   . Pneumonia 11/2014  . Radiation 04/05/15-05/19/15   right chest wall 50.4 Gy, mastectomy scar 10 Gy  . Thyroid nodule 06/2014    Current Outpatient Prescriptions  Medication Sig Dispense Refill  . acetaminophen (TYLENOL) 325 MG tablet Take 650 mg by mouth 2 (two) times daily as needed for moderate pain or headache. Reported on 04/17/2015    . calcium citrate-vitamin D (CITRACAL+D) 315-200 MG-UNIT tablet Take 1 tablet by mouth 3 (three) times daily.    . diphenhydrAMINE (BENADRYL) 25 MG tablet Take 25 mg by mouth daily as needed for allergies or sleep. Reported on 03/06/2015    . Docusate Sodium (COLACE PO) Take by mouth. Takes a day    . glucose blood (ONETOUCH VERIO) test strip 1 each by Other route as needed for other. Reported on 03/06/2015    . losartan (COZAAR) 100 MG tablet Take 1 tablet by mouth daily. Reported on 03/06/2015    . Melatonin 5 MG TABS Take 5 mg by mouth at bedtime. Reported on 03/06/2015    . metoCLOPramide (REGLAN) 5 MG tablet TAKE 1 TABLET THREE TIMES DAILY BEFORE MEALS. 90 tablet 0  . mometasone (  NASONEX) 50 MCG/ACT nasal spray Place 1-2 sprays into the nose daily as needed (for allergies.).     [name] NOVOLIN 70/30 (70-30) 100 UNIT/ML injection Inject 15 Units into the skin 2 (two) times daily with a meal. Reported on 03/06/2015    . Pediatric Multivitamins-Iron (FLINTSTONES PLUS IRON) chewable tablet Chew 1 tablet by mouth 2 (two) times daily.    . Probiotic Product ([employer] PROBIOTIC PO) Take 1 capsule by mouth daily. Reported on 03/06/2015    . Propylene Glycol (SYSTANE BALANCE OP) Apply 1-2 drops to eye 3 (three) times daily.    . Vortioxetine HBr (TRINTELLIX) 20 MG TABS Take 20 mg by mouth at bedtime.      No current facility-administered  medications for this encounter.     Allergies  Allergen Reactions  . Nsaids Other (See Comments)    H/o gastric bypass - avoid NSAIDs  . Peanuts [Peanut Oil] Itching    "Large amounts"  . Tape Hives and Rash      Social History   Social History  . Marital status: Married    Spouse name: Darlene [name]  . Number of children: 0  . 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 Smoking status: Never Smoker  . Smokeless tobacco: Never Used  . Alcohol use 0.0 oz/week     Comment: social  . Drug use: No  . Sexual activity: Yes    Birth control/ protection: Post-menopausal   Other Topics Concern  . Not on file   Social History Narrative   Patient is married Darlene [name]) and lives at home with her husband.   Patient does not have any children.   Patient is working for a [employer].   Patient has a Bachelor's degree.   Patient is right-handed.   Patient does not drink any caffeine.      Family History  Problem Relation Age of Onset  . CVA Mother   . Diabetes Father   . Heart failure Father   . Hypertension Sister   . Diabetes Brother   . Breast cancer Paternal Grandmother   . Diabetes Paternal Uncle     Vitals:   11/14/15 1106  BP: (!) 150/70  Pulse: 61  SpO2: 99%  Weight: 220 lb 8 oz (100 kg)    PHYSICAL EXAM: General:  Well appearing. No respiratory difficulty HEENT: normal Neck: supple. no JVD. Carotids 2+ bilat; no bruits. No lymphadenopathy or thryomegaly appreciated. Cor: PMI nondisplaced. Regular rate & rhythm. No rubs, gallops or murmurs. Lungs: clear Abdomen: soft, nontender, nondistended. No hepatosplenomegaly. No bruits or masses. Good bowel sounds. Extremities: no cyanosis, clubbing, rash, edema Neuro: alert & oriented x 3, cranial nerves grossly intact. moves all 4 extremities w/o difficulty. Affect pleasant.  ASSESSMENT & PLAN:  1. Breast cancer: I reviewed  today's echo. LV systolic function remains normal.  She has completed Herceptin therapy.  At this point, she does not need further echo screening. .  2. HTN: Continue current regimen, may need an additional medication.  Would work on weight loss first. .  3. OSA: Continue nightly CPAP   Loralie Champagne 11/14/2015

## 2015-11-14 NOTE — Progress Notes (Signed)
*  PRELIMINARY RESULTS* Echocardiogram 2D Echocardiogram has been performed.  Leavy Cella 11/14/2015, 10:39 AM

## 2015-11-15 ENCOUNTER — Encounter: Payer: Self-pay | Admitting: Dietician

## 2015-11-15 ENCOUNTER — Encounter: Payer: BLUE CROSS/BLUE SHIELD | Attending: Surgery | Admitting: Dietician

## 2015-11-15 DIAGNOSIS — Z9884 Bariatric surgery status: Secondary | ICD-10-CM | POA: Insufficient documentation

## 2015-11-15 DIAGNOSIS — Z9221 Personal history of antineoplastic chemotherapy: Secondary | ICD-10-CM | POA: Insufficient documentation

## 2015-11-15 DIAGNOSIS — E669 Obesity, unspecified: Secondary | ICD-10-CM | POA: Diagnosis present

## 2015-11-15 DIAGNOSIS — E119 Type 2 diabetes mellitus without complications: Secondary | ICD-10-CM

## 2015-11-15 DIAGNOSIS — Z794 Long term (current) use of insulin: Secondary | ICD-10-CM

## 2015-11-15 NOTE — Patient Instructions (Addendum)
-  Keep bottles of water at work to help encourage you to drink more water -Continue to get up and walk in the afternoons -Continue to keep healthier snacks on hand   -Prepare yourself a little snack mix for the afternoon (nuts, chocolate chips, jerky, popcorn)   -[place] Balanced Breaks  -Listen to your hunger and fullness cues (stop before you get too full) -Consider testing your blood sugar (keep your meter on hand and test if you feel funny)  -Keep a meter at work -Eat protein first, then vegetables, then starch if there's room  -Think about ways to increase activity   -Good shoes and inserts for walking!  -Try to find a walking buddy or listen to music  -Continue to try to be more active at work  -Try 1 pump of flavoring in lattes

## 2015-11-15 NOTE — Progress Notes (Signed)
Bariatric Nutrition follow up 3 years post op RYGB  Medical Nutrition Therapy:  Appt start time: 0835 end time:  905  Primary concerns today: Post-operative Bariatric Surgery Nutrition Management.  [name] is here today for nutrition counseling. She had RYGB in October 2014 years ago and "it went very well." She states she was then diagnosed with breast cancer 15 months ago and has had a mastectomy and recently finished chemo and radiation. Was told to "eat what I could hold down" during the time when she was sick from cancer treatments. Continues to avoid drinking with meals but has started drinking diet sodas in the afternoons. She would like to learn more about the bariatric nutrition recommendations and reports that she has felt confused about all the nutrition advice she has received for her various illnesses (diabetes, breast cancer, and bariatric surgery). Plans to follow up with Dr. Hassell Done in October and has recently attended bariatric support group. Husband does the grocery shopping and cooking in the house. [name] states that she tolerates all foods. She reports no dumping syndrome but does struggle with constipation.  [name] takes 15 units of insulin morning and evening. She does not test her blood sugars but does not feel "low." States that her most recent A1c was 7.5%. Checks her feet daily and recently went to podiatrist. Gets regular eye and dental exams.   Follow up: [name] returns having lost 4 pounds in the last month. She had an echocardiogram that showed a thickening of her heart. [name] states she has been stressed about this. Checking blood sugars more often and staying around 80-130 mg/dL. Had a blood sugar over 200 mg/dL after a pumpkin spice latte. Has been working to reduce artificial sweeteners. Has reduced Diet Mountain Dew to 1 per week. Has also been avoiding the snack machine at work. Has another appt with Dr. Hassell Done in November.  Highest weight: 330 lbs Goal: 199-175 lbs  TANITA   BODY COMP RESULTS  10/12/15 11/15/15   BMI (kg/m^2) 32.5 error   Fat Mass (lbs) 97.6    Fat Free Mass (lbs) 122.2    Total Body Water (lbs) 87.8     Preferred Learning Style:   No preference indicated   Learning Readiness:   Ready  24-hr recall: B (AM): scrambled eggs with toast or peanut butter on toast Snk (AM): [place] or other light [place] yogurt or apple, cheese  L (PM): Atkins frozen meal Snk at work (PM):  Snk at home before dinner (PM): mixed nuts D (PM): BLT on 1 piece bread  Snk (PM): [place] Balanced Break  Fluid intake: 20 oz diet Mountain Dew, 16 oz decaf coffee, 10 oz almond milk with Unjury shake, 20 oz water Estimated total protein intake: ~80 grams per day  Medications: see list Supplementation: Flintstones complete 2x a day, Calcium chews 3x a day  CBG monitoring: does not test Average CBG per patient: unknown Last patient reported A1c: 7.5%   Recent physical activity:  Not much due to bunions  Progress Towards Goal(s):  In progress.  Handouts given during visit include:  Type 2 Diabetes Support Group Flyer   Nutritional Diagnosis:  [place]-3.4 Unintentional weight gain As related to inability to comply with bariatric diet recommendations during cancer treatments.  As evidenced by patient report of recent weight gain.    Intervention:  Nutrition counseling provided. Praised patient on her positive attitude and healthy habits she has maintained. Reviewed newest bariatric diet recommendations. Goals:  -Keep bottles of water at  work to help encourage you to drink more water -Continue to get up and walk in the afternoons -Continue to keep healthier snacks on hand   -Prepare yourself a little snack mix for the afternoon (nuts, chocolate chips, jerky, popcorn)   -[place] Balanced Breaks  -Listen to your hunger and fullness cues (stop before you get too full) -Consider testing your blood sugar (keep your meter on hand and test if you feel  funny)  -Keep a meter at work -Eat protein first, then vegetables, then starch if there's room -Think about ways to increase activity   -Good shoes and inserts for walking!  -Look for a water aerobics class that you are interested in  Teaching Method Utilized:  Visual Auditory Hands on  Barriers to learning/adherence to lifestyle change: none  Demonstrated degree of understanding via:  Teach Back   Monitoring/Evaluation:  Dietary intake, exercise, and body weight. Follow up in 6 weeks.

## 2015-11-20 ENCOUNTER — Telehealth: Payer: Self-pay | Admitting: Adult Health

## 2015-11-20 NOTE — Telephone Encounter (Signed)
Returned patient call re earlier time for 12/7 SCP visit. SCP visit moved from 11:30 am w/GD to 9 am (two LTS slots) to be closer to her 8 am f/u with Dr. Sondra Come. Patient has new time for 12/7 SCP visit.

## 2015-11-28 ENCOUNTER — Encounter: Payer: Self-pay | Admitting: Physical Therapy

## 2015-11-28 ENCOUNTER — Ambulatory Visit: Payer: BLUE CROSS/BLUE SHIELD | Attending: Oncology | Admitting: Physical Therapy

## 2015-11-28 DIAGNOSIS — N3946 Mixed incontinence: Secondary | ICD-10-CM | POA: Diagnosis present

## 2015-11-28 DIAGNOSIS — M62838 Other muscle spasm: Secondary | ICD-10-CM

## 2015-11-28 DIAGNOSIS — M6281 Muscle weakness (generalized): Secondary | ICD-10-CM | POA: Insufficient documentation

## 2015-11-28 NOTE — H&P (Signed)
  Subjective:     Patient ID: Darlene [name] is a 65 y.o. female.  Follow-up   Patient underwent oncoplastic reduction following right lumpectomy with clear margins. However on reduction specimen tumor emboli within lymphatics noted and patient underwent completion mastectomy. Would like to address right axillary and lateral chest wall fullness.  Presented with palpable lump in her right breast. Bilateral diagnostic MMG with tomosynthesis (06/2014) demonstrated right UOQ focal asymmetry. US showed 1.7 mass and abnormal-appearing lymph node in the right axillary tail. Biopsy of the right breast mass and LN both were positive for IDC, ER/PR -, Her 2 +. MRI noted normal left breast; right breast mass part of a larger non mass clumped area of enhancement, which involves lateral central breast measuring 6.9 by 5.8 by 6.2 cm. Additionally, numerous few mm satellite nodules extend anteriorly, inferiorly and superiorly to this more confluent non mass regional area of enhancement. Completed neoadjuvant chemotherapy. Final pathology lumpectomy/SLN 4 cm IDC, margins clear, 1/3 SLN positive. Reduction pathology with left breast benign, right breast with intralymphatic emboli of ductal carcinoma, multiple foci, present in randomly submitted tissue. Underwent completion mastectomy on right and completed XRT 4.28.17.  Reduction left 478 g. MMG on left due 10/17  History significant  bariatric surgery with Dr. Hassell Done. Prior 46 DDD, prior to wt loss G cup Highest wt 332 lb, and goal 175 lb      Objective:   Physical Exam  Cardiovascular: Normal rate, regular rhythm and normal heart sounds.   Pulmonary/Chest: Effort normal and breath sounds normal.   Chest left some bottoming out of breast, nipple stimulates but patient reports no sensation, right chest hyperpigmentation, flat, excess soft tissue/lateral chest wall roll and medial extent scar nodular    Assessment:     R breast cancer,  Neoadjuvant chemotherapy    s/p right lumpectomy with SLN, oncoplastic reduction S/p right mastectomy for multiple intra lymphatic tumor emboli on reduction specimen Adjuvant radiation  Plan:     Plan revision mastectomy scar on right to address fullness. Discussed OP surgery, extension of scar onto lateral chest, possible liposuction to aid with flattening of contour. Reviewed time off work, recovery.   [name], MD [facility] Plastic [facility] Reconstructive Surgery [phone], pin [phone]

## 2015-11-28 NOTE — Patient Instructions (Signed)
Toileting Techniques for Bowel Movements (Defecation) Using your belly (abdomen) and pelvic floor muscles to have a bowel movement is usually instinctive.  Sometimes people can have problems with these muscles and have to relearn proper defecation (emptying) techniques.  If you have weakness in your muscles, organs that are falling out, decreased sensation in your pelvis, or ignore your urge to go, you may find yourself straining to have a bowel movement.  You are straining if you are: . holding your breath or taking in a huge gulp of air and holding it  . keeping your lips and jaw tensed and closed tightly . turning red in the face because of excessive pushing or forcing . developing or worsening your  hemorrhoids . getting faint while pushing . not emptying completely and have to defecate many times a day  If you are straining, you are actually making it harder for yourself to have a bowel movement.  Many people find they are pulling up with the pelvic floor muscles and closing off instead of opening the anus. Due to lack pelvic floor relaxation and coordination the abdominal muscles, one has to work harder to push the feces out.  Many people have never been taught how to defecate efficiently and effectively.  Notice what happens to your body when you are having a bowel movement.  While you are sitting on the toilet pay attention to the following areas: . Jaw and mouth position . Angle of your hips   . Whether your feet touch the ground or not . Arm placement  . Spine position . Waist . Belly tension . Anus (opening of the anal canal)  An Evacuation/Defecation Plan   Here are the 4 basic points:  1. Lean forward enough for your elbows to rest on your knees 2. Support your feet on the floor or use a low stool if your feet don't touch the floor  3. Push out your belly as if you have swallowed a beach ball-you should feel a widening of your waist 4. Open and relax your pelvic floor muscles,  rather than tightening around the anus      The following conditions my require modifications to your toileting posture:  . If you have had surgery in the past that limits your back, hip, pelvic, knee or ankle flexibility . Constipation   Your healthcare practitioner may make the following additional suggestions and adjustments:  1) Sit on the toilet  a) Make sure your feet are supported. b) Notice your hip angle and spine position-most people find it effective to lean forward or raise their knees, which can help the muscles around the anus to relax  c) When you lean forward, place your forearms on your thighs for support  2) Relax suggestions a) Breath deeply in through your nose and out slowly through your mouth as if you are smelling the flowers and blowing out the candles. b) To become aware of how to relax your muscles, contracting and releasing muscles can be helpful.  Pull your pelvic floor muscles in tightly by using the image of holding back gas, or closing around the anus (visualize making a circle smaller) and lifting the anus up and in.  Then release the muscles and your anus should drop down and feel open. Repeat 5 times ending with the feeling of relaxation. c) Keep your pelvic floor muscles relaxed; let your belly bulge out. d) The digestive tract starts at the mouth and ends at the anal opening, so be   sure to relax both ends of the tube.  Place your tongue on the roof of your mouth with your teeth separated.  This helps relax your mouth and will help to relax the anus at the same time.  3) Empty (defecation) a) Keep your pelvic floor and sphincter relaxed, then bulge your anal muscles.  Make the anal opening wide.  b) Stick your belly out as if you have swallowed a beach ball. c) Make your belly wall hard using your belly muscles while continuing to breathe. Doing this makes it easier to open your anus. d) Breath out and give a grunt (or try using other sounds such as  ahhhh, shhhhh, ohhhh or grrrrrrr).  4) Finish a) As you finish your bowel movement, pull the pelvic floor muscles up and in.  This will leave your anus in the proper place rather than remaining pushed out and down. If you leave your anus pushed out and down, it will start to feel as though that is normal and give you incorrect signals about needing to have a bowel movement.    Darlene [name], PT [facility] [place] [address] [place], [place] [address] About Abdominal Massage  Abdominal massage, also called external colon massage, is a self-treatment circular massage technique that can reduce and eliminate gas and ease constipation. The colon naturally contracts in waves in a clockwise direction starting from inside the right hip, moving up toward the ribs, across the belly, and down inside the left hip.  When you perform circular abdominal massage, you help stimulate your colon's normal wave pattern of movement called peristalsis.  It is most beneficial when done after eating.  Positioning You can practice abdominal massage with oil while lying down, or in the shower with soap.  Some people find that it is just as effective to do the massage through clothing while sitting or standing.  How to Massage Start by placing your finger tips or knuckles on your right side, just inside your hip bone.  . Make small circular movements while you move upward toward your rib cage.   . Once you reach the bottom right side of your rib cage, take your circular movements across to the left side of the bottom of your rib cage.  . Next, move downward until you reach the inside of your left hip bone.  This is the path your feces travel in your colon. . Continue to perform your abdominal massage in this pattern for 10 minutes each day.     You can apply as much pressure as is comfortable in your massage.  Start gently and build pressure as you continue to practice.  Notice any areas of  pain as you massage; areas of slight pain may be relieved as you massage, but if you have areas of significant or intense pain, consult with your healthcare provider.  Other Considerations . General physical activity including bending and stretching can have a beneficial massage-like effect on the colon.  Deep breathing can also stimulate the colon because breathing deeply activates the same nervous system that supplies the colon.   . Abdominal massage should always be used in combination with a bowel-conscious diet that is high in the proper type of fiber for you, fluids (primarily water), and a regular exercise program.

## 2015-11-28 NOTE — Therapy (Signed)
[facility] [address] [name], [place], [address] Phone: [phone]   Fax:  [phone]  Physical Therapy Evaluation  Patient Details  Name: Darlene [name] MRN: [id number] Date of Birth: 04/21/1950 Referring Provider: Chauncey Cruel  Encounter Date: 11/28/2015      PT End of Session - 11/28/15 1705    Visit Number 1   Date for PT Re-Evaluation 01/23/16   PT Start Time 4562   PT Stop Time 1620   PT Time Calculation (min) 50 min   Activity Tolerance Patient tolerated treatment well   Behavior During Therapy [facility] for tasks assessed/performed      Past Medical History:  Diagnosis Date  . Anemia    hx of  . Anxiety   . Arthritis    in fingers, shoulders  . Balance problem   . Breast cancer ([place])   . Breast cancer of upper-outer quadrant of right female breast ([place]) 07/22/2014  . Clumsiness   . Constipation   . Depression   . Diabetes mellitus without complication ([place])    56+ years  . Eating disorder    binge eating  . Epistaxis 08/26/2014  . Fatty liver   . HCAP (healthcare-associated pneumonia) 12/18/2014  . Headache   . Heart murmur    never had any problems  . Hyperlipidemia   . Hypertension   . Neuromuscular disorder ([place])    diabetic neuropathy  . Obesity   . Obstructive sleep apnea on CPAP    uses cpap nightly  . Ovarian cyst rupture    possible  . Pneumonia   . Pneumonia 11/2014  . Radiation 04/05/15-05/19/15   right chest wall 50.4 Gy, mastectomy scar 10 Gy  . Thyroid nodule 06/2014    Past Surgical History:  Procedure Laterality Date  . BIOPSY THYROID Bilateral 06/2014   2 nodules - Benign   . BREAST LUMPECTOMY WITH RADIOACTIVE SEED AND SENTINEL LYMPH NODE BIOPSY Right 12/27/2014   Procedure: BREAST LUMPECTOMY WITH RADIOACTIVE SEED AND SENTINEL LYMPH NODE BIOPSY;  Surgeon: Stark Klein, MD;  Location: [place];  Service: General;  Laterality: Right;  . BREAST  REDUCTION SURGERY Bilateral 01/06/2015   Procedure: BILATERAL BREAST REDUCTION, RIGHT ONCOPLASTIC RECONSTRUCTION),LEFT BREAST REDUCTION FOR SYMMETRY;  Surgeon: [name], MD;  Location: [place];  Service: Plastics;  Laterality: Bilateral;  . BREATH TEK H PYLORI N/A 09/15/2012   Procedure: BREATH TEK H PYLORI;  Surgeon: Pedro Earls, MD;  Location: [facility];  Service: General;  Laterality: N/A;  . BUNIONECTOMY Left 12/2013  . CARPAL TUNNEL RELEASE Right   . CARPAL TUNNEL RELEASE Left   . DUPUYTREN CONTRACTURE RELEASE    . GASTRIC ROUX-EN-Y N/A 11/02/2012   Procedure: LAPAROSCOPIC ROUX-EN-Y GASTRIC;  Surgeon: Pedro Earls, MD;  Location: [facility];  Service: General;  Laterality: N/A;  . LAPAROSCOPIC APPENDECTOMY N/A 07/22/2015   Procedure: APPENDECTOMY LAPAROSCOPIC;  Surgeon: Michael Boston, MD;  Location: [facility];  Service: General;  Laterality: N/A;  . MASTECTOMY Right 02/15/2015   modified  . MODIFIED MASTECTOMY Right 02/15/2015   Procedure: RIGHT MODIFIED RADICAL MASTECTOMY;  Surgeon: Stark Klein, MD;  Location: [facility];  Service: General;  Laterality: Right;  . PORT-A-CATH REMOVAL Left 10/10/2015   Procedure: PORT REMOVAL;  Surgeon: Stark Klein, MD;  Location: [place];  Service: General;  Laterality: Left;  . PORTACATH PLACEMENT Left 08/02/2014   Procedure: INSERTION PORT-A-CATH;  Surgeon: Stark Klein, MD;  Location: [place];  Service: General;  Laterality: Left;  . ROTATOR CUFF REPAIR Right 2005  . TOE SURGERY  1970s   bone spur  . TRIGGER FINGER RELEASE     x3    There were no vitals filed for this visit.       Subjective Assessment - 11/28/15 1535    Subjective Pt had gastroc surgery 2 years ago and lost weight, then had breast cancer so her weight loss progress has plateaued since then.  Currently patient wants to be sexually active with her husband. States she is able to have orgasms.  She states she has urinary leakage with sneezing, coughing and some  urge sometimes when she has to go immediately.  States she has occasional bowel leakage, and sometimes needs to push around the anus to assist with a bowel movement.  States she has pain with bowel movement and consitpation.  Occasionally wears pad and leakage has been worse last 6 months   Patient Stated Goals reduce pain with intercourse, reduce urinary and fecal leakage   Currently in Pain? Yes   Pain Score 5    Pain Location Vagina   Pain Orientation Mid   Pain Descriptors / Indicators Burning   Pain Type Acute pain   Pain Onset More than a month ago   Pain Frequency Intermittent   Aggravating Factors  anything inserted into vaginal canal   Pain Relieving Factors not having anything going into the vaginal canal   Effect of Pain on Daily Activities intercourse   Multiple Pain Sites No            [facility] PT Assessment - 11/28/15 0001      Assessment   Medical Diagnosis E66.01 morbid obesity, C50.411, A17.1 - Malignant neoplasm of upper outer quadrant of right breast in female, estrogen receptor negative, A98.84 S/p bariatric surgery   Referring Provider Magrinat, Virgie Dad   Hand Dominance Right   Prior Therapy no     Precautions   Precautions Other (comment)   Precaution Comments breast cancer     Restrictions   Weight Bearing Restrictions No     Balance Screen   Has the patient fallen in the past 6 months No   Has the patient had a decrease in activity level because of a fear of falling?  No   Is the patient reluctant to leave their home because of a fear of falling?  No     Home Ecologist residence   Living Arrangements Spouse/significant other     Prior Function   Level of Independence Independent   Vocation Full time employment   Vocation Requirements sitting at [place] for tasks assessed     Observation/Other Assessments   Focus on Therapeutic Outcomes (FOTO)   47% limitation  37% limitation     Posture/Postural Control   Posture/Postural Control Postural limitations   Postural Limitations Rounded Shoulders;Forward head;Decreased lumbar lordosis     ROM / Strength   AROM / PROM / Strength AROM;Strength;PROM     AROM   Lumbar Extension 50% limitation   Lumbar - Right Side Bend 25% limited   Lumbar - Left Side Bend 25% limited     PROM   Right/Left Hip Right   Right Hip Internal Rotation  --  <5 degrees   Left Hip Internal Rotation  --  <15 degrees     Strength   Right/Left Hip --  Right Hip Flexion 3+/5   Left Hip Flexion 3+/5   Left Hip ABduction 3+/5   Right/Left Knee --   Right Knee Flexion 4/5   Left Knee Flexion 4/5     Palpation   SI assessment  no pelvic obliquity     Standardized Balance Assessment   Five times sit to stand comments  19 sec                 Pelvic Floor Special Questions - 11/28/15 0001    Prior Pelvic/Prostate Exam No   Are you Pregnant or attempting pregnancy? No   Prior Pregnancies No   Currently Sexually Active Yes   Is this Painful Yes   Urinary Leakage Yes   How often occasional   Pad use occasional   Activities that cause leaking Coughing;Sneezing   Urinary urgency No   Fecal incontinence Yes   Falling out feeling (prolapse) No   External Perineal Exam able to demonstrate good pelvic floor lifting and excursion   Skin Integrity Intact   External Palpation tender on right side of ischiocavernosus   Prolapse None   Pelvic Floor Internal Exam pt confirms identification and approved PT to assess pelvic floor   Exam Type Vaginal;Rectal   Palpation Tenderness: peroneal body, posterior forchette, right puborectalis, Rt obdurator; Weakness of internal anal sphincter and unable to expel finger during strength test   Strength good squeeze, good lift, able to hold agaisnt strong resistance  internal sphincter not as strong rectally                  PT Education - 11/28/15  1704    Education provided Yes   Education Details educated on toilet techniques and abdominal massage for improved bowel movements   Person(s) Educated Patient   Methods Explanation;Demonstration;Tactile cues;Handout   Comprehension Verbalized understanding;Returned demonstration          PT Short Term Goals - 11/28/15 1717      PT SHORT TERM GOAL #1   Title Pt will be independent with initial HEP   Time 4   Period Weeks   Status New     PT SHORT TERM GOAL #2   Title Pt will report vaginal pain reduction by 50%    Time 4   Period Weeks   Status New     PT SHORT TERM GOAL #3   Title Pt will report fecal incontinence reduced by 50% due to increased pelvic floor strength   Time 4   Period Weeks   Status New           PT Long Term Goals - 11/28/15 1721      PT LONG TERM GOAL #1   Title Pt will report  urinary or fecal incontinence reduced by 75% due to increased strength and coordination   Time 8   Period Weeks   Status New     PT LONG TERM GOAL #2   Title Pt will perform 5x sit to stand in <11 sec to reduce risk of falls   Time 8   Period Weeks   Status New     PT LONG TERM GOAL #3   Title FOTO < or = 37%   Time 8   Period Weeks   Status New     PT LONG TERM GOAL #4   Title penetration of vaginal canal pain free using lubricants and creams   Time 8   Period Weeks   Status New  Plan - 11/28/15 1707    Clinical Impression Statement Patient was seen for moderate complex eval due to condition worsening over previous 6 months and multiple systems effected such as bowel and bladder function as well as joint movement.  Patient reports urinary and fecal incontinence.  Pelvic floor strength is 4/5 with weakness on the right. Patient wears 2-3 pads per day. Patient has burning pain in the vaginal opening with intercourse and during foreplay. She also has comorbidities including obesity, cancer, and previous surgeries.  Pt demonstrates weakness  and decreased control of pelvic floor muscles as well as muscle spasming.  Weakness and stiffness in bilateral LE is also present affecting her mobility and posture during functional activities.  Five times sit to stand was below normal placing patient at increased fall risk, so she will also benefit from skilled PT for reduction in risk of injury.  Pt will benefit from skilled PT to address deficits and provide education for proper techiques in order to improve continence and improve intestine mobility.     Rehab Potential Excellent   Clinical Impairments Affecting Rehab Potential None   PT Frequency 1x / week   PT Duration 8 weeks   PT Treatment/Interventions Biofeedback;Electrical Stimulation;Therapeutic activities;Therapeutic exercise;Neuromuscular re-education;Patient/family education;Manual techniques   PT Next Visit Plan STM to pelvic floor, F/U on education provided, strentching and strengthening of hip and core   PT Home Exercise Plan toilet techniques, self massage for abdominals, progress as needed   Recommended Other Services none   Consulted and Agree with Plan of Care Patient      Patient will benefit from skilled therapeutic intervention in order to improve the following deficits and impairments:  Decreased coordination, Decreased range of motion, Decreased mobility, Decreased strength, Hypomobility, Increased muscle spasms, Impaired flexibility, Pain, Obesity  Visit Diagnosis: Muscle weakness (generalized) - Plan: PT plan of care cert/re-cert  Other muscle spasm - Plan: PT plan of care cert/re-cert  Mixed incontinence - Plan: PT plan of care cert/re-cert     Problem List Patient Active Problem List   Diagnosis Date Noted  . Port catheter in place 08/21/2015  . Acute appendicitis 07/22/2015  . Appendicitis with peritonitis 07/22/2015  . Hepatic steatosis 05/29/2015  . Breast cancer metastasized to axillary lymph node ([place]) 02/15/2015  . Chemotherapy-induced peripheral  neuropathy ([place]) 02/08/2015  . Macromastia 01/06/2015  . Anemia in neoplastic disease 12/21/2014  . Cough   . Breast cancer of upper-outer quadrant of right female breast ([place]) 07/22/2014  . OSA on CPAP 06/02/2013  . Insulin-requiring or dependent type II diabetes mellitus ([place]) 06/02/2013  . 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 Depression 09/10/2012  . hyperlipidemia 09/10/2012  . Morbid obesity ([place]) 09/03/2012   Earlie Counts, PT 11/28/15 5:32 PM   Zannie Cove, PT 11/28/2015, 5:31 PM  [facility] [address] [place], [place], [address] Phone: [phone]   Fax:  [phone]  Name: KYESHIA ZINN MRN: [id number] Date of Birth: [birth year]

## 2015-12-01 ENCOUNTER — Encounter (HOSPITAL_BASED_OUTPATIENT_CLINIC_OR_DEPARTMENT_OTHER): Payer: Self-pay | Admitting: *Deleted

## 2015-12-05 ENCOUNTER — Encounter (HOSPITAL_COMMUNITY)
Admission: RE | Admit: 2015-12-05 | Discharge: 2015-12-05 | Disposition: A | Discharge status: Home / Self Care | Payer: BLUE CROSS/BLUE SHIELD | Source: Ambulatory Visit | Attending: Oncology | Admitting: Oncology

## 2015-12-05 DIAGNOSIS — C773 Secondary and unspecified malignant neoplasm of axilla and upper limb lymph nodes: Secondary | ICD-10-CM | POA: Insufficient documentation

## 2015-12-05 DIAGNOSIS — C50411 Malignant neoplasm of upper-outer quadrant of right female breast: Secondary | ICD-10-CM

## 2015-12-05 DIAGNOSIS — C50911 Malignant neoplasm of unspecified site of right female breast: Secondary | ICD-10-CM | POA: Insufficient documentation

## 2015-12-06 ENCOUNTER — Encounter (HOSPITAL_COMMUNITY)
Admission: RE | Admit: 2015-12-06 | Discharge: 2015-12-06 | Disposition: A | Discharge status: Home / Self Care | Payer: BLUE CROSS/BLUE SHIELD | Source: Ambulatory Visit | Attending: Oncology | Admitting: Oncology

## 2015-12-06 ENCOUNTER — Other Ambulatory Visit (HOSPITAL_BASED_OUTPATIENT_CLINIC_OR_DEPARTMENT_OTHER): Payer: BLUE CROSS/BLUE SHIELD

## 2015-12-06 ENCOUNTER — Ambulatory Visit (HOSPITAL_BASED_OUTPATIENT_CLINIC_OR_DEPARTMENT_OTHER): Payer: BLUE CROSS/BLUE SHIELD | Admitting: Oncology

## 2015-12-06 VITALS — BP 138/53 | HR 57 | Temp 97.8°F | Resp 17 | Ht 69.0 in | Wt 215.9 lb

## 2015-12-06 DIAGNOSIS — C50911 Malignant neoplasm of unspecified site of right female breast: Secondary | ICD-10-CM

## 2015-12-06 DIAGNOSIS — C773 Secondary and unspecified malignant neoplasm of axilla and upper limb lymph nodes: Secondary | ICD-10-CM | POA: Diagnosis present

## 2015-12-06 DIAGNOSIS — R590 Localized enlarged lymph nodes: Secondary | ICD-10-CM

## 2015-12-06 DIAGNOSIS — C50411 Malignant neoplasm of upper-outer quadrant of right female breast: Secondary | ICD-10-CM

## 2015-12-06 DIAGNOSIS — Z853 Personal history of malignant neoplasm of breast: Secondary | ICD-10-CM

## 2015-12-06 DIAGNOSIS — E119 Type 2 diabetes mellitus without complications: Secondary | ICD-10-CM

## 2015-12-06 DIAGNOSIS — C50919 Malignant neoplasm of unspecified site of unspecified female breast: Secondary | ICD-10-CM

## 2015-12-06 DIAGNOSIS — Z171 Estrogen receptor negative status [ER-]: Secondary | ICD-10-CM

## 2015-12-06 LAB — CBC WITH DIFFERENTIAL/PLATELET
BASO%: 0.2 % (ref 0.0–2.0)
Basophils Absolute: 0 10*3/uL (ref 0.0–0.1)
EOS%: 1.4 % (ref 0.0–7.0)
Eosinophils Absolute: 0.1 10*3/uL (ref 0.0–0.5)
HCT: 33.5 % — ABNORMAL LOW (ref 34.8–46.6)
HGB: 11.5 g/dL — ABNORMAL LOW (ref 11.6–15.9)
LYMPH%: 27.9 % (ref 14.0–49.7)
MCH: 31.9 pg (ref 25.1–34.0)
MCHC: 34.3 g/dL (ref 31.5–36.0)
MCV: 93.1 fL (ref 79.5–101.0)
MONO#: 0.5 10*3/uL (ref 0.1–0.9)
MONO%: 9.4 % (ref 0.0–14.0)
NEUT#: 3.5 10*3/uL (ref 1.5–6.5)
NEUT%: 61.1 % (ref 38.4–76.8)
Platelets: 176 10*3/uL (ref 145–400)
RBC: 3.6 10*6/uL — ABNORMAL LOW (ref 3.70–5.45)
RDW: 13.1 % (ref 11.2–14.5)
WBC: 5.8 10*3/uL (ref 3.9–10.3)
lymph#: 1.6 10*3/uL (ref 0.9–3.3)

## 2015-12-06 LAB — COMPREHENSIVE METABOLIC PANEL
ALT: 27 U/L (ref 0–55)
AST: 23 U/L (ref 5–34)
Albumin: 3.7 g/dL (ref 3.5–5.0)
Alkaline Phosphatase: 84 U/L (ref 40–150)
Anion Gap: 9 mEq/L (ref 3–11)
BUN: 31.9 mg/dL — ABNORMAL HIGH (ref 7.0–26.0)
CO2: 24 mEq/L (ref 22–29)
Calcium: 9.5 mg/dL (ref 8.4–10.4)
Chloride: 108 mEq/L (ref 98–109)
Creatinine: 1.3 mg/dL — ABNORMAL HIGH (ref 0.6–1.1)
EGFR: 42 mL/min/{1.73_m2} — ABNORMAL LOW (ref >90)
Glucose: 96 mg/dl (ref 70–140)
Potassium: 4.7 mEq/L (ref 3.5–5.1)
Sodium: 142 mEq/L (ref 136–145)
Total Bilirubin: 0.57 mg/dL (ref 0.20–1.20)
Total Protein: 6.9 g/dL (ref 6.4–8.3)

## 2015-12-06 LAB — GLUCOSE, CAPILLARY: Glucose-Capillary: 120 mg/dL — ABNORMAL HIGH (ref 65–99)

## 2015-12-06 MED ORDER — FLUDEOXYGLUCOSE F - 18 (FDG) INJECTION
10.6800 | Freq: Once | INTRAVENOUS | Status: AC | PRN
  Administered 2015-12-06: 10.68 via INTRAVENOUS

## 2015-12-06 NOTE — Progress Notes (Signed)
[place]  [phone] [phone]   ID: [name] DOB: 10/10/1950  MR#: [id number]  WSF#:[id number]  Patient Care Team: Thressa Sheller, MD as PCP - General (Internal Medicine) Bryson Ha Himmelrich, RD (Inactive) as Dietitian (Bariatrics) Stark Klein, MD as Consulting Physician (General Surgery) Chauncey Cruel, MD as Consulting Physician (Oncology) Arloa Koh, MD as Consulting Physician (Radiation Oncology) Mauro Kaufmann, RN as Registered Nurse Rockwell Germany, RN as Registered Nurse Jacelyn Pi, MD as Consulting Physician (Endocrinology) Kem Boroughs, FNP as Nurse Practitioner (Family Medicine) [name], MD as Consulting Physician (Plastic Surgery) Larey Dresser, MD as Consulting Physician (Cardiology) PCP: Thressa Sheller, MD OTHER MD:  CHIEF COMPLAINT:  HER-2 positive, estrogen receptor negative breast cancer  CURRENT TREATMENT:  Observation  BREAST CANCER HISTORY: From the original intake note:  "Darlene [name]" had screening mammography at her gynecologist suggestive of a possible abnormality in the right breast. However right diagnostic mammogram 04/12/2013 at [facility] was negative. More recently however, she noted a lump in her right breast and brought it to her gynecologist's attention. On 07/18/2014 the patient had bilateral diagnostic mammography with tomosynthesis at [facility]. The breast density was category B. In the right breast upper outer quadrant there was an area of focal asymmetry with indistinct margins.[name] Ultrasound was obtained and showed in addition to the mass in question, measuring 1.7 cm, an abnormal-appearing lymph node in the right axillary tail.  On 07/19/2014 the patient underwent biopsy of the right breast massin question as well as a right axillary lymph node. Both were positive for invasive ductal carcinoma, grade 2, estrogen and progesterone receptor negative, with an MIB-1 of 90%, and HER-2 pending.  Incidentally the lymph node biopsy showed a lymphocytic inflammatory component but no lymph node architecture.  Her subsequent history is as detailed below.  INTERVAL HISTORY:  Darlene [name] returns today for follow-up of her estrogen receptor negative breast cancer accompanied by her husband [name]. Darlene [name] has completed her systemic treatment with anti-HER-2 and capecitabine and also has completed her radiation treatments. She is now under observation.  We just obtained a restaging PET scan, to serve as a new baseline. This shows 2 areas of concern. One is subcutaneous in the left anterior chest wall, at the site of the prior Port-A-Cath and is unlikely to be malignant. The other is subpectoral on the right, measures 0.9 cm and has an SUV max of 5.5. There is no evidence of lung liver or bone involvement, and indeterminant bilateral adrenal adenomas require only follow-up.   REVIEW OF SYSTEMS: Darlene [name] is working hard on her diabetes and her A1c is currently 6.7. She is trying to walk at least 5000 steps a day and is changing her diet to minimize carbohydrates. They are planning a trip to [place] and she would like a compression sleeve to wear on the plane. She complains of hearing loss, stress urinary incontinence, and a total fungus for which she is taking Lamisil. A detailed review of systems today was otherwise stable  PAST MEDICAL HISTORY: Past Medical History:  Diagnosis Date  . Anemia    hx of  . Anxiety   . Arthritis    in fingers, shoulders  . Balance problem   . Breast cancer ([place])   . Breast cancer of upper-outer quadrant of right female breast ([place]) 07/22/2014  . Clumsiness   . Constipation   . Depression   . Diabetes mellitus without complication ([place])    01+ years  . Eating disorder  binge eating  . Epistaxis 08/26/2014  . Fatty liver   . HCAP (healthcare-associated pneumonia) 12/18/2014  . Headache   . Heart murmur    never had any problems  . Hyperlipidemia   . Hypertension   .  Neuromuscular disorder ([place])    diabetic neuropathy  . Obesity   . Obstructive sleep apnea on CPAP    uses cpap nightly  . Ovarian cyst rupture    possible  . Pneumonia   . Pneumonia 11/2014  . Radiation 04/05/15-05/19/15   right chest wall 50.4 Gy, mastectomy scar 10 Gy  . Thyroid nodule 06/2014    PAST SURGICAL HISTORY: Past Surgical History:  Procedure Laterality Date  . BIOPSY THYROID Bilateral 06/2014   2 nodules - Benign   . BREAST LUMPECTOMY WITH RADIOACTIVE SEED AND SENTINEL LYMPH NODE BIOPSY Right 12/27/2014   Procedure: BREAST LUMPECTOMY WITH RADIOACTIVE SEED AND SENTINEL LYMPH NODE BIOPSY;  Surgeon: Stark Klein, MD;  Location: [place];  Service: General;  Laterality: Right;  . BREAST REDUCTION SURGERY Bilateral 01/06/2015   Procedure: BILATERAL BREAST REDUCTION, RIGHT ONCOPLASTIC RECONSTRUCTION),LEFT BREAST REDUCTION FOR SYMMETRY;  Surgeon: [name], MD;  Location: [facility];  Service: Plastics;  Laterality: Bilateral;  . BREATH TEK H PYLORI N/A 09/15/2012   Procedure: BREATH TEK H PYLORI;  Surgeon: Pedro Earls, MD;  Location: [facility];  Service: General;  Laterality: N/A;  . BUNIONECTOMY Left 12/2013  . CARPAL TUNNEL RELEASE Right   . CARPAL TUNNEL RELEASE Left   . DUPUYTREN CONTRACTURE RELEASE    . GASTRIC ROUX-EN-Y N/A 11/02/2012   Procedure: LAPAROSCOPIC ROUX-EN-Y GASTRIC;  Surgeon: Pedro Earls, MD;  Location: [facility];  Service: General;  Laterality: N/A;  . LAPAROSCOPIC APPENDECTOMY N/A 07/22/2015   Procedure: APPENDECTOMY LAPAROSCOPIC;  Surgeon: Michael Boston, MD;  Location: [facility];  Service: General;  Laterality: N/A;  . MASTECTOMY Right 02/15/2015   modified  . MODIFIED MASTECTOMY Right 02/15/2015   Procedure: RIGHT MODIFIED RADICAL MASTECTOMY;  Surgeon: Stark Klein, MD;  Location: [facility];  Service: General;  Laterality: Right;  . PORT-A-CATH REMOVAL Left 10/10/2015   Procedure: PORT REMOVAL;  Surgeon: Stark Klein, MD;  Location: [facility];  Service: General;  Laterality: Left;  . PORTACATH PLACEMENT Left 08/02/2014   Procedure: INSERTION PORT-A-CATH;  Surgeon: Stark Klein, MD;  Location: [facility];  Service: General;  Laterality: Left;  . ROTATOR CUFF REPAIR Right 2005  . TOE SURGERY  1970s   bone spur  . TRIGGER FINGER RELEASE     x3    FAMILY HISTORY Family History  Problem Relation Age of Onset  . CVA Mother   . Diabetes Father   . Heart failure Father   . Hypertension Sister   . Diabetes Brother   . Breast cancer Paternal Grandmother   . Diabetes Paternal Uncle    the patient's father died from complications of diabetes at age 12. The patient's mother died at age 109 with a subarachnoid hemorrhage. The patient had one brother, one sister. The only breast cancer was the patient's paternal grandmother diagnosed in her 53s. There is no history of ovarian cancer in the family.  GYNECOLOGIC HISTORY:  Patient's last menstrual period was 10/22/2007.  menarche age 20, the patient is GX P0. She went through the change of life in 2011. She did not take hormone replacement.  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 Her  husband [name] is a retired Visual merchandiser (he taught at [employer]).  [name] has 2 children from an earlier marriage, Rockey Situ who teaches in [place], and Isabeau Mccalla,  who works at the [employer] in in [place]. He has 1 grand son, aged 37. The patient and her husband are not church attender's    ADVANCED DIRECTIVES:  Not in place   HEALTH MAINTENANCE: Social History  Substance Use Topics  . Smoking status: Never Smoker  . Smokeless tobacco: Never Used  . Alcohol use 0.0 oz/week     Comment: social     Colonoscopy:  May 2016  PAP:  Bone density: 07/05/2013 normal, with a T score of 0.4  Lipid panel:  Allergies  Allergen Reactions  . Nsaids Other (See Comments)    H/o gastric bypass -  avoid NSAIDs  . Peanuts [Peanut Oil] Itching    "Large amounts"  . Tape Hives and Rash    Adhesives in bandaids    Current Outpatient Prescriptions  Medication Sig Dispense Refill  . acetaminophen (TYLENOL) 325 MG tablet Take 650 mg by mouth 2 (two) times daily as needed for moderate pain or headache. Reported on 04/17/2015    . calcium citrate-vitamin D (CITRACAL+D) 315-200 MG-UNIT tablet Take 1 tablet by mouth 3 (three) times daily.    . diphenhydrAMINE (BENADRYL) 25 MG tablet Take 25 mg by mouth daily as needed for allergies or sleep. Reported on 03/06/2015    . Docusate Sodium (COLACE PO) Take by mouth. Takes a day    . glucose blood (ONETOUCH VERIO) test strip 1 each by Other route as needed for other. Reported on 03/06/2015    . losartan (COZAAR) 100 MG tablet Take 1 tablet by mouth daily. Reported on 03/06/2015    . Melatonin 5 MG TABS Take 5 mg by mouth at bedtime. Reported on 03/06/2015    . metoCLOPramide (REGLAN) 5 MG tablet TAKE 1 TABLET THREE TIMES DAILY BEFORE MEALS. 90 tablet 0  . mometasone (NASONEX) 50 MCG/ACT nasal spray Place 1-2 sprays into the nose daily as needed (for allergies.).     [name] NOVOLIN 70/30 (70-30) 100 UNIT/ML injection Inject 5-10 Units into the skin 2 (two) times daily with a meal. Reported on 03/06/2015    . Pediatric Multivitamins-Iron (FLINTSTONES PLUS IRON) chewable tablet Chew 1 tablet by mouth 2 (two) times daily.    . Probiotic Product ([employer] PROBIOTIC PO) Take 1 capsule by mouth daily. Reported on 03/06/2015    . Propylene Glycol (SYSTANE BALANCE OP) Apply 1-2 drops to eye 3 (three) times daily.    . Terbinafine HCl (LAMISIL PO) Take by mouth.    . Vortioxetine HBr (TRINTELLIX) 20 MG TABS Take 20 mg by mouth at bedtime.      No current facility-administered medications for this visit.     OBJECTIVE:  Middle-aged white woman In no acute distress Vitals:   12/06/15 1534  BP: (!) 138/53  Pulse: (!) 57  Resp: 17  Temp: 97.8 F (36.6 C)     Body mass  index is 31.88 kg/m.    ECOG FS:1 - Symptomatic but completely ambulatory  Sclerae unicteric, pupils round and equal Oropharynx clear and moist-- no thrush or other lesions No cervical or supraclavicular adenopathy Lungs no rales or rhonchi Heart regular rate and rhythm Abd soft, obese, nontender, positive bowel sounds MSK no focal spinal tenderness, no upper extremity lymphedema Neuro: nonfocal, well oriented, appropriate affect Breasts: The right breast is status post mastectomy. There  is no evidence of chest wall recurrence. The right axilla is benign.  LAB RESULTS:  CMP     Component Value Date/Time   NA 142 12/06/2015 1509   K 4.7 12/06/2015 1509   CL 107 10/10/2015 1007   CO2 24 12/06/2015 1509   GLUCOSE 96 12/06/2015 1509   BUN 31.9 (H) 12/06/2015 1509   CREATININE 1.3 (H) 12/06/2015 1509   CALCIUM 9.5 12/06/2015 1509   PROT 6.9 12/06/2015 1509   ALBUMIN 3.7 12/06/2015 1509   AST 23 12/06/2015 1509   ALT 27 12/06/2015 1509   ALKPHOS 84 12/06/2015 1509   BILITOT 0.57 12/06/2015 1509   GFRNONAA 51 (L) 10/10/2015 1007   GFRNONAA 55 (L) 02/18/2013 0827   GFRAA 59 (L) 10/10/2015 1007   GFRAA 63 02/18/2013 0827    INo results found for: SPEP, UPEP  Lab Results  Component Value Date   WBC 5.8 12/06/2015   NEUTROABS 3.5 12/06/2015   HGB 11.5 (L) 12/06/2015   HCT 33.5 (L) 12/06/2015   MCV 93.1 12/06/2015   PLT 176 12/06/2015      Chemistry      Component Value Date/Time   NA 142 12/06/2015 1509   K 4.7 12/06/2015 1509   CL 107 10/10/2015 1007   CO2 24 12/06/2015 1509   BUN 31.9 (H) 12/06/2015 1509   CREATININE 1.3 (H) 12/06/2015 1509      Component Value Date/Time   CALCIUM 9.5 12/06/2015 1509   ALKPHOS 84 12/06/2015 1509   AST 23 12/06/2015 1509   ALT 27 12/06/2015 1509   BILITOT 0.57 12/06/2015 1509       No results found for: LABCA2  No components found for: LABCA125  No results for input(s): INR in the last 168 hours.  Urinalysis      Component Value Date/Time   COLORURINE YELLOW 07/21/2015 [place] 07/21/2015 2328   LABSPEC 1.020 07/21/2015 2328   PHURINE 6.0 07/21/2015 2328   GLUCOSEU NEGATIVE 07/21/2015 2328   HGBUR TRACE (A) 07/21/2015 [place] 07/21/2015 2328   BILIRUBINUR neg 06/29/2013 [place] 07/21/2015 2328   PROTEINUR 30 (A) 07/21/2015 2328   UROBILINOGEN negative 06/29/2013 1017   NITRITE NEGATIVE 07/21/2015 2328   LEUKOCYTESUR NEGATIVE 07/21/2015 2328    STUDIES: Nm Pet Image Initial (pi) Skull Base To Thigh  Result Date: 12/06/2015 CLINICAL DATA:  Subsequent treatment strategy for breast cancer. EXAM: NUCLEAR MEDICINE PET SKULL BASE TO THIGH TECHNIQUE: 10.68 mCi F-18 FDG was injected intravenously. Full-ring PET imaging was performed from the skull base to thigh after the radiotracer. CT data was obtained and used for attenuation correction and anatomic localization. FASTING BLOOD GLUCOSE:  Value: 120 mg/dl COMPARISON:  None FINDINGS: NECK No hypermetabolic lymph nodes in the neck. CHEST Within the upper left chest wall there is a subcutaneous nodule measuring 1.5 cm, image 52 of series 4. SUV max equals 6.4. Right retropectoral lymph node is identified measuring 9 mm, image 54 of series 4. SUV max equals 5.5. Postsurgical changes from right mastectomy and right axillary nodal dissection identified. Focal area of increased soft tissue adjacent to right axillary surgical clip measures 1.5 cm and has an SUV max equal to 3.49, image 64 of series 4. Aortic atherosclerosis noted. Calcifications in the RCA, LAD and left circumflex coronary arteries identified. No hypermetabolic mediastinal or hilar nodes. No suspicious pulmonary nodules on the CT scan. ABDOMEN/PELVIS No abnormal hypermetabolic activity within the liver, pancreas, adrenal glands,  or spleen. Gallstones are identified. There is a stone within the inferior pole of the left kidney. Nodule in the left  adrenal gland measures 1.4 cm and exhibits mild FDG uptake, SUV max equals 3.7, image 107 of series 4. Portacaval node measures 1.4 cm and has an SUV max equal to 5.6. SKELETON No focal hypermetabolic activity to suggest skeletal metastasis. IMPRESSION: 1. Postsurgical changes from recent right mastectomy and nodal dissection noted. 2. There is a right subpectoral lymph node which measures 9 mm and exhibits malignant range FDG uptake. Suspicious for metastatic adenopathy. 3. Subcutaneous nodule within the upper left chest wall exhibits malignant range FDG uptake. Indeterminate. This is at the site of previous porta catheter and is favored to represent postsurgical change. 4. Aortic atherosclerosis and multi vessel coronary artery calcification. 5. Indeterminate left adrenal nodules identified which exhibits mild FDG uptake. This is favored to represent a benign adenoma. Confirmatory imaging with adrenal protocol MRI should be considered. Electronically Signed   By: Kerby Moors M.D.   On: 12/06/2015 11:09    ASSESSMENT: 65 y.o. [place] woman status post right breast upper outer quadrant and right axillary lymph node biopsy 07/19/2014 for a cT2  pN1, stage IIB  invasive ductal carcinoma, grade 2, estrogen and progesterone receptor negative, with an MIB-1 of 90%, and HER-2 amplified  (1) neoadjuvant chemotherapy started 08/08/2014, consisting of carboplatin, docetaxel, trastuzumab and pertuzumab given every 3 weeks 6, completed 11/21/2014  (a) breast MRI after initial 3 cycles shows a significant response  (b) breast MRI after 6 cycles shows a complete radiologic response   (2) trastuzumab continued through 10/02/2015 (to end of capecitabine therapy)  (a) echo 02/13/2015 shows an EF of 60-65%  (b)  Echo 05/09/2015 shows an ejection fraction of 60-65%  (c) echocardiogram 06/27/2015 shows an ejection fraction of 55-60%.  (3) right lumpectomy with sentinel lymph node biopsy on 12/27/14 showed a  residual  ypT2 ypN1a, stage IIB invasive ductal carcinoma, grade 2, with repeat estrogen and progesterone receptors again negative  (4) status post right oncoplastic surgery (with left reduction mammoplasty) 01/06/2015 showing in the right breast multiple foci of intralymphatic carcinoma in random sections  (5)  Right modified radical mastectomy 02/15/2014 showed no residual invasive disease; there was DCIS but margins were negative; all 15 axillary lymph nodes were clear; ypTis ypN0  (a)  The patient has decided against reconstruction.  (6)  Postmastectomy radiation completed 05/18/2015 with capecitabine sensitization  (7) adjuvant capecitabine continued through 09/27/2015  (8) hepatic steatosis with increased fibrosis score (at risk for cirrhosis)  (9) thyroid biopsy (left lobe inferiorly) 2 shows benign tissue 06/28/2015   PLAN: Darlene [name] continues to move towards a "new normal" and is working hard on her chronic issues including weight and diabetes. She is looking for to some travel with her husband. She is back to work full time.  The PET scan has one finding of concern, which is the subpectoral lymph node on the right side. The subcutaneous spot on the left side is likely to be postop from port removal. There may be mild inflammation of in that area. The uptake in the adrenal glands really requires only follow-up. It is favorable that we don't see obvious evidence of stage IV disease  The question is what to do with the right chest wall nodule. The options are biopsy or further observation. I will discuss with interventional radiology on difficult it may be to needle this very small mass area if it is difficult, it is  not unreasonable simply to repeat a PET scan in 3 months and that is the more likely choice.  She is planning a minor oncoplastic surgery to remove the "dog years" from the axillary area. She continues to benefit from participation in the "intimacy and pelvic health" program  through physical therapy.  Today I wrote Jozy for compression sleeve on the right and instructed her on how to use it particularly when she does long airplane travel as anticipated. I'm going to see her again in 3 months. Likely we will repeat a PET scan prior to that visit.  She knows to call for any problems that may develop before then.   Chauncey Cruel, MD   12/07/2015 7:54 AM

## 2015-12-07 ENCOUNTER — Encounter: Payer: Self-pay | Admitting: Physical Therapy

## 2015-12-07 ENCOUNTER — Ambulatory Visit: Payer: BLUE CROSS/BLUE SHIELD | Admitting: Physical Therapy

## 2015-12-07 DIAGNOSIS — M6281 Muscle weakness (generalized): Secondary | ICD-10-CM | POA: Diagnosis not present

## 2015-12-07 DIAGNOSIS — M62838 Other muscle spasm: Secondary | ICD-10-CM

## 2015-12-07 NOTE — Therapy (Signed)
[facility] [address] [place], [place], [address] Phone: [phone]   Fax:  [phone]  Physical Therapy Treatment  Patient Details  Name: Darlene [name] MRN: [id number] Date of Birth: 06/15/1950 Referring Provider: Chauncey Cruel  Encounter Date: 12/07/2015      PT End of Session - 12/07/15 1611    Visit Number 2   Date for PT Re-Evaluation 01/23/16   PT Start Time 5366   PT Stop Time 1611   PT Time Calculation (min) 41 min   Activity Tolerance Patient tolerated treatment well   Behavior During Therapy [facility] for tasks assessed/performed      Past Medical History:  Diagnosis Date  . Anemia    hx of  . Anxiety   . Arthritis    in fingers, shoulders  . Balance problem   . Breast cancer ([place])   . Breast cancer of upper-outer quadrant of right female breast ([place]) 07/22/2014  . Clumsiness   . Constipation   . Depression   . Diabetes mellitus without complication ([place])    44+ years  . Eating disorder    binge eating  . Epistaxis 08/26/2014  . Fatty liver   . HCAP (healthcare-associated pneumonia) 12/18/2014  . Headache   . Heart murmur    never had any problems  . Hyperlipidemia   . Hypertension   . Neuromuscular disorder ([place])    diabetic neuropathy  . Obesity   . Obstructive sleep apnea on CPAP    uses cpap nightly  . Ovarian cyst rupture    possible  . Pneumonia   . Pneumonia 11/2014  . Radiation 04/05/15-05/19/15   right chest wall 50.4 Gy, mastectomy scar 10 Gy  . Thyroid nodule 06/2014    Past Surgical History:  Procedure Laterality Date  . BIOPSY THYROID Bilateral 06/2014   2 nodules - Benign   . BREAST LUMPECTOMY WITH RADIOACTIVE SEED AND SENTINEL LYMPH NODE BIOPSY Right 12/27/2014   Procedure: BREAST LUMPECTOMY WITH RADIOACTIVE SEED AND SENTINEL LYMPH NODE BIOPSY;  Surgeon: Stark Klein, MD;  Location: [place];  Service: General;  Laterality: Right;  . BREAST  REDUCTION SURGERY Bilateral 01/06/2015   Procedure: BILATERAL BREAST REDUCTION, RIGHT ONCOPLASTIC RECONSTRUCTION),LEFT BREAST REDUCTION FOR SYMMETRY;  Surgeon: [name], MD;  Location: [place];  Service: Plastics;  Laterality: Bilateral;  . BREATH TEK H PYLORI N/A 09/15/2012   Procedure: BREATH TEK H PYLORI;  Surgeon: Pedro Earls, MD;  Location: [facility];  Service: General;  Laterality: N/A;  . BUNIONECTOMY Left 12/2013  . CARPAL TUNNEL RELEASE Right   . CARPAL TUNNEL RELEASE Left   . DUPUYTREN CONTRACTURE RELEASE    . GASTRIC ROUX-EN-Y N/A 11/02/2012   Procedure: LAPAROSCOPIC ROUX-EN-Y GASTRIC;  Surgeon: Pedro Earls, MD;  Location: [facility];  Service: General;  Laterality: N/A;  . LAPAROSCOPIC APPENDECTOMY N/A 07/22/2015   Procedure: APPENDECTOMY LAPAROSCOPIC;  Surgeon: Michael Boston, MD;  Location: [facility];  Service: General;  Laterality: N/A;  . MASTECTOMY Right 02/15/2015   modified  . MODIFIED MASTECTOMY Right 02/15/2015   Procedure: RIGHT MODIFIED RADICAL MASTECTOMY;  Surgeon: Stark Klein, MD;  Location: [facility];  Service: General;  Laterality: Right;  . PORT-A-CATH REMOVAL Left 10/10/2015   Procedure: PORT REMOVAL;  Surgeon: Stark Klein, MD;  Location: [facility];  Service: General;  Laterality: Left;  . PORTACATH PLACEMENT Left 08/02/2014   Procedure: INSERTION PORT-A-CATH;  Surgeon: Stark Klein, MD;  Location: [place];  Service: General;  Laterality: Left;  . ROTATOR CUFF REPAIR Right 2005  . TOE SURGERY  1970s   bone spur  . TRIGGER FINGER RELEASE     x3    There were no vitals filed for this visit.      Subjective Assessment - 12/07/15 1537    Subjective Reports unsure if she is doing the abdominal massage correctly.  Pt states had a PET scan yesterday and there is a "hot" lymph node that they will be keeping an eye on.  States she is going to have surgery to remove lumpy part of the scar under her arm next week and will be going to the beach while  recovering.  States she does not believe it will be a long recovery.   Patient Stated Goals reduce pain with intercourse, reduce urinary and fecal leakage   Currently in Pain? No/denies                         [facility] Adult PT Treatment/Exercise - 12/07/15 0001      Therapeutic Activites    Therapeutic Activities ADL's   ADL's going from sit to stand and supine to sitting with abdominal bracing and breathing out     Lumbar Exercises: Supine   Ab Set 10 reps;5 seconds  with pelvic floor contraction   AB Set Limitations used a sheet criss crossed around the abdomen to facilitate the abdominal muscle to come together and lower the rib cage     Manual Therapy   Manual Therapy Myofascial release;Soft tissue mobilization;Manual Lymphatic Drainage (MLD)   Manual therapy comments stimulation to lymph system in abdomen   Soft tissue mobilization pulling abdomials from lumbar circumferencially around abdomen to facilitate increased abdominal contraction   Myofascial Release releasing pouch of [place] between the vaginal canal and rectum; lifting of trasverse abdominus for fascia release                PT Education - 12/07/15 1544    Education provided Yes   Education Details reviewed abdominal massage, education on lubricant and moisturizers   Person(s) Educated Patient   Methods Explanation;Demonstration;Verbal cues;Tactile cues;Handout   Comprehension Verbalized understanding;Returned demonstration          PT Short Term Goals - 11/28/15 1717      PT SHORT TERM GOAL #1   Title Pt will be independent with initial HEP   Time 4   Period Weeks   Status New     PT SHORT TERM GOAL #2   Title Pt will report vaginal pain reduction by 50%    Time 4   Period Weeks   Status New     PT SHORT TERM GOAL #3   Title Pt will report fecal incontinence reduced by 50% due to increased pelvic floor strength   Time 4   Period Weeks   Status New           PT Long  Term Goals - 11/28/15 1721      PT LONG TERM GOAL #1   Title Pt will report  urinary or fecal incontinence reduced by 75% due to increased strength and coordination   Time 8   Period Weeks   Status New     PT LONG TERM GOAL #2   Title Pt will perform 5x sit to stand in <11 sec to reduce risk of falls   Time 8   Period Weeks   Status New  PT LONG TERM GOAL #3   Title FOTO < or = 37%   Time 8   Period Weeks   Status New     PT LONG TERM GOAL #4   Title penetration of vaginal canal pain free using lubricants and creams   Time 8   Period Weeks   Status New               Plan - 12/07/15 1614    Clinical Impression Statement Patient just started therapy so she has not met goals yet.  Patient was able to contract her abdominals but needed tactile cues. Patient needs verbal cues to brace abdominals whith transitional movements due to protruding her abdomen.  Patient understand how to perform self abdominal massage.  She has gotten a Physiological scientist to assist in [place] .Patient will benefit from skilled therapy to improve pelvic floor coordination .    Rehab Potential Excellent   Clinical Impairments Affecting Rehab Potential None   PT Frequency 1x / week   PT Duration 8 weeks   PT Treatment/Interventions Biofeedback;Electrical Stimulation;Therapeutic activities;Therapeutic exercise;Neuromuscular re-education;Patient/family education;Manual techniques   PT Next Visit Plan STM to pelvic floor, , stretching and strengthening of hip and core   PT Home Exercise Plan go over lubricants, progress as needed   Consulted and Agree with Plan of Care Patient      Patient will benefit from skilled therapeutic intervention in order to improve the following deficits and impairments:  Decreased coordination, Decreased range of motion, Decreased mobility, Decreased strength, Hypomobility, Increased muscle spasms, Impaired flexibility, Pain, Obesity  Visit Diagnosis: Muscle weakness  (generalized)  Other muscle spasm     Problem List Patient Active Problem List   Diagnosis Date Noted  . Port catheter in place 08/21/2015  . Acute appendicitis 07/22/2015  . Appendicitis with peritonitis 07/22/2015  . Hepatic steatosis 05/29/2015  . Breast cancer metastasized to axillary lymph node ([place]) 02/15/2015  . Chemotherapy-induced peripheral neuropathy ([place]) 02/08/2015  . Macromastia 01/06/2015  . Anemia in neoplastic disease 12/21/2014  . Cough   . Breast cancer of upper-outer quadrant of right female breast ([place]) 07/22/2014  . OSA on CPAP 06/02/2013  . Insulin-requiring or dependent type II diabetes mellitus ([place]) 06/02/2013  . 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 Depression 09/10/2012  . hyperlipidemia 09/10/2012  . Morbid obesity ([place]) 09/03/2012    Earlie Counts, PT 12/07/15 4:32 PM   [facility] [address], [place], [place], [address] Phone: [phone]   Fax:  [phone]  Name: Darlene [name] MRN: [id number] Date of Birth: 1950/12/22

## 2015-12-07 NOTE — Patient Instructions (Addendum)
Moisturizers . They are used in the vagina to hydrate the mucous membrane that make up the vaginal canal. . Designed to keep a more normal acid balance (ph) . Once placed in the vagina, it will last between two to three days.  . Use 2-3 times per week at bedtime and last longer than 60 min. . Ingredients to avoid is glycerin and fragrance, can increase chance of infection . Should not be used just before sex due to causing irritation . Most are gels administered either in a tampon-shaped applicator or as a vaginal suppository. They are non-hormonal.   Types of Moisturizers . Replens- drug store . Samul Dada- drug store . Vitamin E vaginal suppositories- Whole foods . Moist Again . Coconut oil- can break down condoms  Yes - amazon.com (they also have a lubricant  Things to avoid in the vaginal area . Do not use things to irritate the vulvar area . No lotions . No soaps; can use Aveeno or Calendula cleanser if needed. Must be gentle . No deodorants . No douches . Good to sleep without underwear to let the vaginal area to air out . No scrubbing: spread the lips to let warm water rinse over labias and pat dry Diastasis Recti Correction With Towel (Hook-Lying)    Loop long towel or sheet under back and criss-cross over lower abdomen. Inhale. In one fluid movement: Pull in navel, pull towel tight across abdomen, exhale, raise head toward chest, and Hold for _5__ seconds. Return, rest for _5__ seconds. Repeat _10__ times. Do _2-3__ times a day.   Copyright  VHI. All rights reserved.  Diastasis Recti Correction With Towel [facility])  [facility] [address] [place], [place] [address] Phone # [phone] Fax [phone]  Earlie Counts, PT @TODAY @ 4:09 PM    Copyright  VHI. All rights reserved.

## 2015-12-08 ENCOUNTER — Encounter (HOSPITAL_BASED_OUTPATIENT_CLINIC_OR_DEPARTMENT_OTHER)
Admission: RE | Disposition: A | Discharge status: Home / Self Care | Payer: Self-pay | Source: Ambulatory Visit | Attending: Plastic Surgery

## 2015-12-08 ENCOUNTER — Ambulatory Visit (HOSPITAL_BASED_OUTPATIENT_CLINIC_OR_DEPARTMENT_OTHER): Payer: BLUE CROSS/BLUE SHIELD | Admitting: Anesthesiology

## 2015-12-08 ENCOUNTER — Ambulatory Visit (HOSPITAL_BASED_OUTPATIENT_CLINIC_OR_DEPARTMENT_OTHER)
Admission: RE | Admit: 2015-12-08 | Discharge: 2015-12-08 | Disposition: A | Discharge status: Home / Self Care | Payer: BLUE CROSS/BLUE SHIELD | Source: Ambulatory Visit | Attending: Plastic Surgery | Admitting: Plastic Surgery

## 2015-12-08 ENCOUNTER — Encounter (HOSPITAL_BASED_OUTPATIENT_CLINIC_OR_DEPARTMENT_OTHER): Payer: Self-pay | Admitting: Anesthesiology

## 2015-12-08 DIAGNOSIS — Z9011 Acquired absence of right breast and nipple: Secondary | ICD-10-CM | POA: Diagnosis not present

## 2015-12-08 DIAGNOSIS — L987 Excessive and redundant skin and subcutaneous tissue: Secondary | ICD-10-CM | POA: Insufficient documentation

## 2015-12-08 DIAGNOSIS — I10 Essential (primary) hypertension: Secondary | ICD-10-CM | POA: Insufficient documentation

## 2015-12-08 DIAGNOSIS — Z923 Personal history of irradiation: Secondary | ICD-10-CM | POA: Diagnosis not present

## 2015-12-08 DIAGNOSIS — Z9884 Bariatric surgery status: Secondary | ICD-10-CM | POA: Insufficient documentation

## 2015-12-08 DIAGNOSIS — L905 Scar conditions and fibrosis of skin: Secondary | ICD-10-CM | POA: Diagnosis present

## 2015-12-08 DIAGNOSIS — E119 Type 2 diabetes mellitus without complications: Secondary | ICD-10-CM | POA: Insufficient documentation

## 2015-12-08 DIAGNOSIS — Z9221 Personal history of antineoplastic chemotherapy: Secondary | ICD-10-CM | POA: Insufficient documentation

## 2015-12-08 DIAGNOSIS — Z853 Personal history of malignant neoplasm of breast: Secondary | ICD-10-CM | POA: Insufficient documentation

## 2015-12-08 HISTORY — PX: SCAR REVISION: SHX5285

## 2015-12-08 LAB — GLUCOSE, CAPILLARY: Glucose-Capillary: 135 mg/dL — ABNORMAL HIGH (ref 65–99)

## 2015-12-08 LAB — POCT I-STAT, CHEM 8
BUN: 34 mg/dL — ABNORMAL HIGH (ref 6–20)
Calcium, Ion: 1.23 mmol/L (ref 1.15–1.40)
Chloride: 105 mmol/L (ref 101–111)
Creatinine, Ser: 1.4 mg/dL — ABNORMAL HIGH (ref 0.44–1.00)
Glucose, Bld: 132 mg/dL — ABNORMAL HIGH (ref 65–99)
HCT: 34 % — ABNORMAL LOW (ref 36.0–46.0)
Hemoglobin: 11.6 g/dL — ABNORMAL LOW (ref 12.0–15.0)
Potassium: 4.5 mmol/L (ref 3.5–5.1)
Sodium: 141 mmol/L (ref 135–145)
TCO2: 25 mmol/L (ref 0–100)

## 2015-12-08 SURGERY — REVISION, SCAR
Anesthesia: General | Site: Chest | Laterality: Right

## 2015-12-08 MED ORDER — BUPIVACAINE HCL 0.25 % IJ SOLN
INTRAMUSCULAR | Status: DC | PRN
  Administered 2015-12-08: 20 mL

## 2015-12-08 MED ORDER — ROCURONIUM BROMIDE 10 MG/ML (PF) SYRINGE
PREFILLED_SYRINGE | INTRAVENOUS | Status: AC
  Filled 2015-12-08: qty 10

## 2015-12-08 MED ORDER — OXYCODONE HCL 5 MG/5ML PO SOLN: 5.0000 mg | Freq: Once | ORAL | Status: DC | PRN

## 2015-12-08 MED ORDER — FENTANYL CITRATE (PF) 100 MCG/2ML IJ SOLN: 25.0000 ug | INTRAMUSCULAR | Status: DC | PRN

## 2015-12-08 MED ORDER — PROMETHAZINE HCL 25 MG/ML IJ SOLN: 6.2500 mg | INTRAMUSCULAR | Status: DC | PRN

## 2015-12-08 MED ORDER — SUGAMMADEX SODIUM 200 MG/2ML IV SOLN
INTRAVENOUS | Status: DC | PRN
  Administered 2015-12-08: 200 mg via INTRAVENOUS

## 2015-12-08 MED ORDER — ONDANSETRON HCL 4 MG/2ML IJ SOLN: 4.0000 mg | Freq: Once | INTRAMUSCULAR | Status: DC | PRN

## 2015-12-08 MED ORDER — ONDANSETRON HCL 4 MG/2ML IJ SOLN
INTRAMUSCULAR | Status: AC
  Filled 2015-12-08: qty 2

## 2015-12-08 MED ORDER — FENTANYL CITRATE (PF) 100 MCG/2ML IJ SOLN
25.0000 ug | INTRAMUSCULAR | Status: DC | PRN
  Administered 2015-12-08 (×2): 25 ug via INTRAVENOUS
  Administered 2015-12-08: 50 ug via INTRAVENOUS

## 2015-12-08 MED ORDER — LACTATED RINGERS IV SOLN
INTRAVENOUS | Status: DC
  Administered 2015-12-08 (×2): via INTRAVENOUS

## 2015-12-08 MED ORDER — BUPIVACAINE HCL (PF) 0.25 % IJ SOLN
INTRAMUSCULAR | Status: AC
  Filled 2015-12-08: qty 60

## 2015-12-08 MED ORDER — SUCCINYLCHOLINE CHLORIDE 200 MG/10ML IV SOSY
PREFILLED_SYRINGE | INTRAVENOUS | Status: AC
  Filled 2015-12-08: qty 10

## 2015-12-08 MED ORDER — PROPOFOL 10 MG/ML IV BOLUS
INTRAVENOUS | Status: AC
  Filled 2015-12-08: qty 20

## 2015-12-08 MED ORDER — DEXAMETHASONE SODIUM PHOSPHATE 4 MG/ML IJ SOLN
INTRAMUSCULAR | Status: DC | PRN
  Administered 2015-12-08: 10 mg via INTRAVENOUS

## 2015-12-08 MED ORDER — HYDROCODONE-ACETAMINOPHEN 7.5-325 MG PO TABS: 1.0000 | ORAL_TABLET | Freq: Once | ORAL | Status: DC | PRN

## 2015-12-08 MED ORDER — CEFAZOLIN SODIUM-DEXTROSE 2-4 GM/100ML-% IV SOLN
2.0000 g | INTRAVENOUS | Status: AC
  Administered 2015-12-08: 2 g via INTRAVENOUS

## 2015-12-08 MED ORDER — FENTANYL CITRATE (PF) 100 MCG/2ML IJ SOLN
INTRAMUSCULAR | Status: AC
  Filled 2015-12-08: qty 2

## 2015-12-08 MED ORDER — ROCURONIUM BROMIDE 100 MG/10ML IV SOLN
INTRAVENOUS | Status: DC | PRN
  Administered 2015-12-08: 50 mg via INTRAVENOUS

## 2015-12-08 MED ORDER — CHLORHEXIDINE GLUCONATE CLOTH 2 % EX PADS: 6.0000 | MEDICATED_PAD | Freq: Once | CUTANEOUS | Status: DC

## 2015-12-08 MED ORDER — OXYCODONE HCL 5 MG PO TABS: 5.0000 mg | ORAL_TABLET | Freq: Once | ORAL | Status: DC | PRN

## 2015-12-08 MED ORDER — PHENYLEPHRINE 40 MCG/ML (10ML) SYRINGE FOR IV PUSH (FOR BLOOD PRESSURE SUPPORT)
PREFILLED_SYRINGE | INTRAVENOUS | Status: AC
  Filled 2015-12-08: qty 10

## 2015-12-08 MED ORDER — SCOPOLAMINE 1 MG/3DAYS TD PT72: 1.0000 | MEDICATED_PATCH | Freq: Once | TRANSDERMAL | Status: DC | PRN

## 2015-12-08 MED ORDER — EPHEDRINE SULFATE 50 MG/ML IJ SOLN
INTRAMUSCULAR | Status: DC | PRN
  Administered 2015-12-08: 15 mg via INTRAVENOUS
  Administered 2015-12-08: 10 mg via INTRAVENOUS

## 2015-12-08 MED ORDER — EPHEDRINE 5 MG/ML INJ
INTRAVENOUS | Status: AC
  Filled 2015-12-08: qty 10

## 2015-12-08 MED ORDER — ONDANSETRON HCL 4 MG/2ML IJ SOLN
INTRAMUSCULAR | Status: DC | PRN
  Administered 2015-12-08: 4 mg via INTRAVENOUS

## 2015-12-08 MED ORDER — SUGAMMADEX SODIUM 200 MG/2ML IV SOLN
INTRAVENOUS | Status: AC
  Filled 2015-12-08: qty 2

## 2015-12-08 MED ORDER — LIDOCAINE 2% (20 MG/ML) 5 ML SYRINGE
INTRAMUSCULAR | Status: AC
  Filled 2015-12-08: qty 5

## 2015-12-08 MED ORDER — MIDAZOLAM HCL 2 MG/2ML IJ SOLN
INTRAMUSCULAR | Status: AC
  Filled 2015-12-08: qty 2

## 2015-12-08 MED ORDER — PROPOFOL 10 MG/ML IV BOLUS
INTRAVENOUS | Status: DC | PRN
  Administered 2015-12-08: 150 mg via INTRAVENOUS

## 2015-12-08 MED ORDER — GLYCOPYRROLATE 0.2 MG/ML IJ SOLN
INTRAMUSCULAR | Status: DC | PRN
  Administered 2015-12-08: 0.2 mg via INTRAVENOUS

## 2015-12-08 MED ORDER — LIDOCAINE HCL (CARDIAC) 20 MG/ML IV SOLN
INTRAVENOUS | Status: DC | PRN
  Administered 2015-12-08: 50 mg via INTRAVENOUS

## 2015-12-08 MED ORDER — DEXAMETHASONE SODIUM PHOSPHATE 10 MG/ML IJ SOLN
INTRAMUSCULAR | Status: AC
  Filled 2015-12-08: qty 1

## 2015-12-08 MED ORDER — CEFAZOLIN SODIUM-DEXTROSE 2-4 GM/100ML-% IV SOLN
INTRAVENOUS | Status: AC
  Filled 2015-12-08: qty 100

## 2015-12-08 MED ORDER — MIDAZOLAM HCL 2 MG/2ML IJ SOLN
1.0000 mg | INTRAMUSCULAR | Status: DC | PRN
Start: 2015-12-08 — End: 2015-12-08
  Administered 2015-12-08: 2 mg via INTRAVENOUS

## 2015-12-08 MED ORDER — FENTANYL CITRATE (PF) 100 MCG/2ML IJ SOLN
50.0000 ug | INTRAMUSCULAR | Status: AC | PRN
  Administered 2015-12-08 (×2): 50 ug via INTRAVENOUS
  Administered 2015-12-08: 100 ug via INTRAVENOUS

## 2015-12-08 SURGICAL SUPPLY — 81 items
BINDER ABDOMINAL 10 UNV 27-48 (MISCELLANEOUS) IMPLANT
BINDER ABDOMINAL 12 SM 30-45 (SOFTGOODS) IMPLANT
BINDER BREAST LRG (GAUZE/BANDAGES/DRESSINGS) IMPLANT
BINDER BREAST MEDIUM (GAUZE/BANDAGES/DRESSINGS) IMPLANT
BINDER BREAST XLRG (GAUZE/BANDAGES/DRESSINGS) IMPLANT
BINDER BREAST XXLRG (GAUZE/BANDAGES/DRESSINGS) IMPLANT
BLADE CLIPPER SURG (BLADE) IMPLANT
BLADE SURG 10 STRL SS (BLADE) ×3 IMPLANT
BLADE SURG 11 STRL SS (BLADE) ×3 IMPLANT
BLADE SURG 15 STRL LF DISP TIS (BLADE) IMPLANT
BLADE SURG 15 STRL SS (BLADE)
BNDG GAUZE ELAST 4 BULKY (GAUZE/BANDAGES/DRESSINGS) IMPLANT
CANISTER LIPO FAT HARVEST (MISCELLANEOUS) ×3 IMPLANT
CANISTER SUCT 1200ML W/VALVE (MISCELLANEOUS) IMPLANT
CHLORAPREP W/TINT 26ML (MISCELLANEOUS) ×3 IMPLANT
COMPRESSION GARMENT LG MOREWEL (MISCELLANEOUS) IMPLANT
COMPRESSION GARMENT MD MOREWEL (MISCELLANEOUS) IMPLANT
COMPRESSION GARMENT XL MOREWEL (MISCELLANEOUS) IMPLANT
COTTONBALL LRG STERILE PKG (GAUZE/BANDAGES/DRESSINGS) IMPLANT
COVER BACK TABLE 60X90IN (DRAPES) ×3 IMPLANT
COVER MAYO STAND STRL (DRAPES) ×3 IMPLANT
DECANTER SPIKE VIAL GLASS SM (MISCELLANEOUS) IMPLANT
DRAPE IMP U-DRAPE 54X76 (DRAPES) IMPLANT
DRAPE LAPAROSCOPIC ABDOMINAL (DRAPES) ×3 IMPLANT
DRAPE U-SHAPE 76X120 STRL (DRAPES) IMPLANT
DRSG PAD ABDOMINAL 8X10 ST (GAUZE/BANDAGES/DRESSINGS) ×3 IMPLANT
ELECT COATED BLADE 2.86 ST (ELECTRODE) IMPLANT
ELECT NEEDLE BLADE 2-5/6 (NEEDLE) IMPLANT
ELECT REM PT RETURN 9FT ADLT (ELECTROSURGICAL) ×3
ELECTRODE REM PT RTRN 9FT ADLT (ELECTROSURGICAL) ×2 IMPLANT
FILTER LIPOSUCTION (MISCELLANEOUS) IMPLANT
GAUZE XEROFORM 1X8 LF (GAUZE/BANDAGES/DRESSINGS) IMPLANT
GAUZE XEROFORM 5X9 LF (GAUZE/BANDAGES/DRESSINGS) IMPLANT
GLOVE BIO SURGEON STRL SZ 6 (GLOVE) ×3 IMPLANT
GLOVE BIO SURGEON STRL SZ 6.5 (GLOVE) IMPLANT
GLOVE BIOGEL PI IND STRL 7.0 (GLOVE) ×4 IMPLANT
GLOVE BIOGEL PI INDICATOR 7.0 (GLOVE) ×2
GLOVE ECLIPSE 6.5 STRL STRAW (GLOVE) ×3 IMPLANT
GOWN STRL REUS W/ TWL LRG LVL3 (GOWN DISPOSABLE) ×4 IMPLANT
GOWN STRL REUS W/TWL LRG LVL3 (GOWN DISPOSABLE) ×2
LINER CANISTER 1000CC FLEX (MISCELLANEOUS) IMPLANT
LIQUID BAND (GAUZE/BANDAGES/DRESSINGS) ×6 IMPLANT
NDL SAFETY ECLIPSE 18X1.5 (NEEDLE) ×2 IMPLANT
NEEDLE HYPO 18GX1.5 SHARP (NEEDLE) ×1
NEEDLE PRECISIONGLIDE 27X1.5 (NEEDLE) IMPLANT
NS IRRIG 1000ML POUR BTL (IV SOLUTION) ×3 IMPLANT
PACK BASIN DAY SURGERY FS (CUSTOM PROCEDURE TRAY) ×3 IMPLANT
PACK UNIVERSAL I (CUSTOM PROCEDURE TRAY) IMPLANT
PAD ALCOHOL SWAB (MISCELLANEOUS) IMPLANT
PENCIL BUTTON HOLSTER BLD 10FT (ELECTRODE) ×3 IMPLANT
SHEET MEDIUM DRAPE 40X70 STRL (DRAPES) ×6 IMPLANT
SLEEVE SCD COMPRESS KNEE MED (MISCELLANEOUS) ×3 IMPLANT
SPONGE GAUZE 2X2 8PLY STRL LF (GAUZE/BANDAGES/DRESSINGS) IMPLANT
SPONGE LAP 18X18 X RAY DECT (DISPOSABLE) ×3 IMPLANT
STAPLER VISISTAT 35W (STAPLE) ×3 IMPLANT
STRIP CLOSURE SKIN 1/2X4 (GAUZE/BANDAGES/DRESSINGS) IMPLANT
STRIP CLOSURE SKIN 1/4X4 (GAUZE/BANDAGES/DRESSINGS) IMPLANT
SUT MNCRL AB 3-0 PS2 18 (SUTURE) IMPLANT
SUT MNCRL AB 4-0 PS2 18 (SUTURE) ×3 IMPLANT
SUT PLAIN 5 0 P 3 18 (SUTURE) IMPLANT
SUT PROLENE 5 0 P 3 (SUTURE) IMPLANT
SUT PROLENE 5 0 PS 2 (SUTURE) IMPLANT
SUT PROLENE 6 0 P 1 18 (SUTURE) IMPLANT
SUT VIC AB 3-0 PS1 18 (SUTURE) ×2
SUT VIC AB 3-0 PS1 18XBRD (SUTURE) ×4 IMPLANT
SUT VIC AB 3-0 SH 27 (SUTURE)
SUT VIC AB 3-0 SH 27X BRD (SUTURE) IMPLANT
SUT VICRYL 4-0 PS2 18IN ABS (SUTURE) ×3 IMPLANT
SYR 10ML LL (SYRINGE) IMPLANT
SYR 50ML LL SCALE MARK (SYRINGE) IMPLANT
SYR BULB 3OZ (MISCELLANEOUS) IMPLANT
SYR BULB IRRIGATION 50ML (SYRINGE) ×3 IMPLANT
SYR CONTROL 10ML LL (SYRINGE) ×3 IMPLANT
SYR TB 1ML LL NO SAFETY (SYRINGE) ×3 IMPLANT
TOWEL OR 17X24 6PK STRL BLUE (TOWEL DISPOSABLE) ×6 IMPLANT
TRAY DSU PREP LF (CUSTOM PROCEDURE TRAY) IMPLANT
TUBE CONNECTING 20X1/4 (TUBING) IMPLANT
TUBING INFILTRATION IT-10001 (TUBING) ×3 IMPLANT
TUBING SET GRADUATE ASPIR 12FT (MISCELLANEOUS) IMPLANT
UNDERPAD 30X30 (UNDERPADS AND DIAPERS) ×3 IMPLANT
YANKAUER SUCT BULB TIP NO VENT (SUCTIONS) ×3 IMPLANT

## 2015-12-08 NOTE — Transfer of Care (Signed)
Immediate Anesthesia Transfer of Care Note  Patient: Darlene [name]  Procedure(s) Performed: Procedure(s): COMPLEX REPAIR RIGHT CHEST 10-15CM (Right)  Patient Location: PACU  Anesthesia Type:General  Level of Consciousness: awake, alert  and oriented  Airway & Oxygen Therapy: Patient Spontanous Breathing and Patient connected to face mask oxygen  Post-op Assessment: Report given to RN and Post -op Vital signs reviewed and stable  Post vital signs: Reviewed and stable  Last Vitals:  Vitals:   12/08/15 1014  BP: 133/66  Pulse: (!) 53  Resp: 20  Temp: 36.5 C    Last Pain:  Vitals:   12/08/15 1014  TempSrc: Oral         Complications : No apparent anesthesia complications

## 2015-12-08 NOTE — Anesthesia Postprocedure Evaluation (Signed)
Anesthesia Post Note  Patient: Darlene [name]  Procedure(s) Performed: Procedure(s) (LRB): COMPLEX REPAIR RIGHT CHEST 10-15CM (Right)  Anesthesia Type: General Level of consciousness: awake, awake and alert and oriented Pain management: pain level controlled Vital Signs Assessment: post-procedure vital signs reviewed and stable Respiratory status: spontaneous breathing, respiratory function stable and nonlabored ventilation Cardiovascular status: blood pressure returned to baseline Anesthetic complications: yes Comments: Patient has small area of ecchymosis at the tip of the tongue. No skin breakdown. I explained that this most likely is due to her biting her tongue most likely while emerging from anesthesia. She understood and will observe. If the area fails to heal or an ulcer develops, she will call the [facility] for folllowup.    Last Vitals:  Vitals:   12/08/15 1245 12/08/15 1312  BP: (!) 167/80 (!) 153/71  Pulse: 89 81  Resp: 15 16  Temp:  36.9 C    Last Pain:  Vitals:   12/08/15 1312  TempSrc: Oral  PainSc: 2                  JOSLIN,DAVID COKER

## 2015-12-08 NOTE — Anesthesia Procedure Notes (Signed)
Procedure Name: Intubation Date/Time: 12/08/2015 10:57 AM Performed by: Melynda Ripple D Pre-anesthesia Checklist: Patient identified, Emergency Drugs available, Suction available and Patient being monitored Patient Re-evaluated:Patient Re-evaluated prior to inductionOxygen Delivery Method: Circle system utilized Preoxygenation: Pre-oxygenation with 100% oxygen Intubation Type: IV induction Ventilation: Mask ventilation without difficulty Laryngoscope Size: Mac Grade View: Grade I Tube type: Oral Tube size: 7.0 mm Number of attempts: 1 Airway Equipment and Method: Stylet and Oral airway Placement Confirmation: ETT inserted through vocal cords under direct vision,  positive ETCO2 and breath sounds checked- equal and bilateral Secured at: 22 cm Tube secured with: Tape Dental Injury: Teeth and Oropharynx as per pre-operative assessment

## 2015-12-08 NOTE — Anesthesia Preprocedure Evaluation (Addendum)
Anesthesia Evaluation  Patient identified by MRN, date of birth, ID band Patient awake    Reviewed: Allergy & Precautions, NPO status , Patient's Chart, lab work & pertinent test results  Airway Mallampati: II  TM Distance: >3 FB Neck ROM: Full    Dental  (+) Teeth Intact, Dental Advisory Given   Pulmonary    breath sounds clear to auscultation       Cardiovascular hypertension,  Rhythm:Regular Rate:Normal     Neuro/Psych    GI/Hepatic   Endo/Other  diabetes  Renal/GU      Musculoskeletal   Abdominal   Peds  Hematology   Anesthesia Other Findings   Reproductive/Obstetrics                             Anesthesia Physical Anesthesia Plan  ASA: III  Anesthesia Plan: General   Post-op Pain Management:    Induction: Intravenous  Airway Management Planned: Oral ETT  Additional Equipment:   Intra-op Plan:   Post-operative Plan: Extubation in OR  Informed Consent: I have reviewed the patients History and Physical, chart, labs and discussed the procedure including the risks, benefits and alternatives for the proposed anesthesia with the patient or authorized representative who has indicated his/her understanding and acceptance.   Dental advisory given  Plan Discussed with: CRNA and Anesthesiologist  Anesthesia Plan Comments:         Anesthesia Quick Evaluation  

## 2015-12-08 NOTE — Interval H&P Note (Signed)
History and Physical Interval Note:  12/08/2015 10:16 AM  Darlene [name]  has presented today for surgery, with the diagnosis of ACQUIED ABCESS BREAST,HISTORY OF BREAST CANCE, HISTORY OF THERAPUDIC RADAION  The various methods of treatment have been discussed with the patient and family. After consideration of risks, benefits and other options for treatment, the patient has consented to  Procedure(s): COMPLEX REPAIR RIGHT CHEST 10-15CM (Right) POSSIBLE LIPO SUCTION TO RIGHT CHEST (Right) as a surgical intervention .  The patient's history has been reviewed, patient examined, no change in status, stable for surgery.  I have reviewed the patient's chart and labs.  Questions were answered to the patient's satisfaction.     THIMMAPPA, BRINDA

## 2015-12-08 NOTE — Op Note (Signed)
Operative Note   DATE OF OPERATION: 11.17.17  LOCATION: [facility]  SURGICAL DIVISION: Plastic Surgery  PREOPERATIVE DIAGNOSES:  1. History right breast cancer 2. Acquired absence breast 3. History therapeutic radiation  POSTOPERATIVE DIAGNOSES:  same  PROCEDURE:  Complex repair chest 18 cm  SURGEON: [name] MD MBA  ASSISTANT: none  ANESTHESIA:  General.   EBL: 25 ml  COMPLICATIONS: None immediate.   INDICATIONS FOR PROCEDURE:  The patient, Darlene [name], is a 65 y.o. female born on Sep 17, 1950, is here for excision redundant mastectomy flap skin.   FINDINGS: Depressed scar over lateral border pectoralis and redundant soft tissue lateral chest wasll.  DESCRIPTION OF PROCEDURE:  The patient's operative site was marked with the patient in the preoperative area. Two area, one area of fullness medial extent scar and larger lateral chest soft tissue roll marked for excision in standing position. The patient was taken to the operating room. SCDs were placed and IV antibiotics were given. The patient's operative site was prepped and draped in a sterile fashion. A time out was performed and all information was confirmed to be correct. Local anesthetic infiltrated surrounding marked areas for excision. Sharp excision of marked areas completed. Closure both areas completed with 3-0 vicryl in dermis and 4-0 monocryl subcuticular skin closure, total length repair two areas 18 cm. Tissue adhesive and dry dressing as pressure dressing, breast binder placed.  The patient was allowed to wake from anesthesia, extubated and taken to the recovery room in satisfactory condition.   SPECIMENS: right mastectomy flap  DRAINS: none  [name], MD [facility] [phone], pin [phone]

## 2015-12-08 NOTE — Discharge Instructions (Signed)

## 2015-12-09 ENCOUNTER — Other Ambulatory Visit: Payer: Self-pay | Admitting: Oncology

## 2015-12-11 ENCOUNTER — Encounter (HOSPITAL_BASED_OUTPATIENT_CLINIC_OR_DEPARTMENT_OTHER): Payer: Self-pay | Admitting: Plastic Surgery

## 2015-12-21 ENCOUNTER — Encounter: Payer: Self-pay | Admitting: Physical Therapy

## 2015-12-21 ENCOUNTER — Ambulatory Visit: Payer: BLUE CROSS/BLUE SHIELD | Admitting: Physical Therapy

## 2015-12-21 DIAGNOSIS — M6281 Muscle weakness (generalized): Secondary | ICD-10-CM

## 2015-12-21 DIAGNOSIS — M62838 Other muscle spasm: Secondary | ICD-10-CM

## 2015-12-21 NOTE — Patient Instructions (Addendum)
Lubrication . Used for intercourse to reduce friction . Avoid ones that have glycerin, warming gels, tingling gels, icing or cooling gel, scented . May need to be reapplied once or several times during sexual activity . Can be applied to both partners genitals prior to vaginal penetration to minimize friction or irritation . Prevent irritation and mucosal tears that cause post coital pain and increased the risk of vaginal and urinary tract infections . Oil-based lubricants cannot be used with condoms due to breaking them down.  Least likely to irritate vaginal tissue.  . Plant based-lubes are safe . Silicone-based lubrication are thicker and last long and used for post-menopausal women Types of Lubricants . Good Clean Love (water based)-[employer], Target, Walmart, [employer]S . Slippery Stuff(water based) [employer] . Sylk (water based) [employer], [place]- drug store; www.blossom-organics.com . Samul Dada- Drug store . Coconut oil- will breakdown condoms, least irritating . Aloe Vera- least irritating . Sliquid Natural H20 (water based)-[employer], good if frequent UTI's . Wet Platinum- (Silicone) Target, [employer] . Yes [place] . [place] Jelly, Replens, and Astroglide kills good Bacteria (lactobadilli) [employer] Things to avoid in the vaginal area . Do not use things to irritate the vulvar area . No lotions . No soaps; can use Aveeno or Calendula cleanser if needed. Must be gentle . No deodorants . No douches . Good to sleep without underwear to let the vaginal area to air out . No scrubbing: spread the lips to let warm water rinse over labias and pat dry    Vmagic Organic Vulva Balm & Intimate Skin Care, Vaginal Moisturizer & Personal Lubricant - Relieves Dryness, Itching, Burning, Redness, Chafing, Odor, Menopause Symptoms - Estrogen Free (2 Ounce)  [facility]   Piriformis Stretch, Sitting    Sit, one ankle on opposite knee, same-side hand on crossed  knee. Push down on knee, keeping spine straight. Lean torso forward, with flat back, until tension is felt in hamstrings and gluteals of crossed-leg side. Hold _30__ seconds.  Repeat __2_ times per session. Do _1__ sessions per day.  Copyright  VHI. All rights reserved.   Chair Sitting    Sit at edge of seat, spine straight, one leg extended. Put a hand on each thigh and bend forward from the hip, keeping spine straight. Allow hand on extended leg to reach toward toes. Support upper body with other arm. Hold _30__ seconds. Repeat _2__ times per session. Do __1_ sessions per day.  Copyright  VHI. All rights reserved.  Butterfly, Supine    Lie on back, feet together. Lower knees toward floor. Hold _30__ seconds. Repeat _2__ times per session. Do _1__ sessions per day.  Copyright  VHI. All rights reserved.  Extensors / Rotators, Supine    Lie supine, one leg straight, other leg bent, knee held by opposite hand. Pull leg across body toward floor. Hold _30__ seconds.  Repeat __2_ times per session. Do _1__ sessions per day.  Copyright  VHI. All rights reserved.  Supine Knee-to-Chest, Bilateral    Lie on back, hands clasped behind both knees. Pull knees in toward chest until a comfortable stretch is felt in lower back and buttocks. Hold _30__ seconds. Repeat _2__ times per session. Do __1_ sessions per day.  Copyright  VHI. All rights reserved.  Concentric Contraction With Pelvic Floor (Hook-Lying)    Lie with hips and knees bent. Slowly inhale, and then exhale. Pull navel toward spine and tighten pelvic floor. Hold 10 sec. Relax. Repeat _10__ times. Do __1_  times a day.   Copyright  VHI. All rights reserved.  Bracing With Knee Fallout (Hook-Lying)    With neutral spine, tighten pelvic floor and abdominals and hold. Alternating legs, drop knee out to side. Keep opposite hip still. Repeat _10__ times. Do _1__ times a day.   Copyright  VHI. All rights reserved.    [facility] [address], [place], [place] [address] Phone # [phone] Fax [phone]

## 2015-12-21 NOTE — Therapy (Addendum)
[facility] [address], [address] [place], [place], [address] Phone: [phone]   Fax:  [phone]  Physical Therapy Treatment  Patient Details  Name: Darlene [name] MRN: [id number] Date of Birth: 10/01/1950 Referring Provider: Magrinat, Gustav C  Encounter Date: 12/21/2015      PT End of Session - 12/21/15 1611    Visit Number 3   Date for PT Re-Evaluation 01/23/16   PT Start Time 1530   PT Stop Time 1611   PT Time Calculation (min) 41 min   Activity Tolerance Patient tolerated treatment well   Behavior During Therapy WFL for tasks assessed/performed      Past Medical History:  Diagnosis Date  . Anemia    hx of  . Anxiety   . Arthritis    in fingers, shoulders  . Balance problem   . Breast cancer (HCC)   . Breast cancer of upper-outer quadrant of right female breast (HCC) 07/22/2014  . Clumsiness   . Constipation   . Depression   . Diabetes mellitus without complication (HCC)    20+ years  . Eating disorder    binge eating  . Epistaxis 08/26/2014  . Fatty liver   . HCAP (healthcare-associated pneumonia) 12/18/2014  . Headache   . Heart murmur    never had any problems  . Hyperlipidemia   . Hypertension   . Neuromuscular disorder (HCC)    diabetic neuropathy  . Obesity   . Obstructive sleep apnea on CPAP    uses cpap nightly  . Ovarian cyst rupture    possible  . Pneumonia   . Pneumonia 11/2014  . Radiation 04/05/15-05/19/15   right chest wall 50.4 Gy, mastectomy scar 10 Gy  . Thyroid nodule 06/2014    Past Surgical History:  Procedure Laterality Date  . BIOPSY THYROID Bilateral 06/2014   2 nodules - Benign   . BREAST LUMPECTOMY WITH RADIOACTIVE SEED AND SENTINEL LYMPH NODE BIOPSY Right 12/27/2014   Procedure: BREAST LUMPECTOMY WITH RADIOACTIVE SEED AND SENTINEL LYMPH NODE BIOPSY;  Surgeon: Faera Byerly, MD;  Location: [facility];  Service: General;  Laterality: Right;  . BREAST  REDUCTION SURGERY Bilateral 01/06/2015   Procedure: BILATERAL BREAST REDUCTION, RIGHT ONCOPLASTIC RECONSTRUCTION),LEFT BREAST REDUCTION FOR SYMMETRY;  Surgeon: Brinda Thimmappa, MD;  Location: [facility];  Service: Plastics;  Laterality: Bilateral;  . BREATH TEK H PYLORI N/A 09/15/2012   Procedure: BREATH TEK H PYLORI;  Surgeon: Matthew B Martin, MD;  Location: [facility];  Service: General;  Laterality: N/A;  . BUNIONECTOMY Left 12/2013  . CARPAL TUNNEL RELEASE Right   . CARPAL TUNNEL RELEASE Left   . DUPUYTREN CONTRACTURE RELEASE    . GASTRIC ROUX-EN-Y N/A 11/02/2012   Procedure: LAPAROSCOPIC ROUX-EN-Y GASTRIC;  Surgeon: Matthew B Martin, MD;  Location: [facility];  Service: General;  Laterality: N/A;  . LAPAROSCOPIC APPENDECTOMY N/A 07/22/2015   Procedure: APPENDECTOMY LAPAROSCOPIC;  Surgeon: Steven Gross, MD;  Location: [facility];  Service: General;  Laterality: N/A;  . MASTECTOMY Right 02/15/2015   modified  . MODIFIED MASTECTOMY Right 02/15/2015   Procedure: RIGHT MODIFIED RADICAL MASTECTOMY;  Surgeon: Faera Byerly, MD;  Location: [facility];  Service: General;  Laterality: Right;  . PORT-A-CATH REMOVAL Left 10/10/2015   Procedure: PORT REMOVAL;  Surgeon: Faera Byerly, MD;  Location: [facility];  Service: General;  Laterality: Left;  . PORTACATH PLACEMENT Left 08/02/2014   Procedure: INSERTION PORT-A-CATH;  Surgeon: Faera Byerly, MD;  Location: [facility];    Service: General;  Laterality: Left;  . ROTATOR CUFF REPAIR Right 2005  . SCAR REVISION Right 12/08/2015   Procedure: COMPLEX REPAIR RIGHT CHEST 10-15CM;  Surgeon: [name], MD;  Location: [facility];  Service: Plastics;  Laterality: Right;  . TOE SURGERY  1970s   bone spur  . TRIGGER FINGER RELEASE     x3    There were no vitals filed for this visit.      Subjective Assessment - 12/21/15 1535    Subjective I can have a bowel movement 70% easier. My procedure last week went well.  I want to focus on the urinary  leakage. I still have to wipe after a bowel movement several times.    Patient Stated Goals reduce pain with intercourse, reduce urinary and fecal leakage   Currently in Pain? No/denies                         [facility] Adult PT Treatment/Exercise - 12/21/15 0001      Self-Care   Self-Care Other Self-Care Comments   Other Self-Care Comments  lubricants for intercourse; creams for vulva area for dryness; scar cream for the breast scar                PT Education - 12/21/15 1609    Education provided Yes   Education Details abdominal bracing with pelvic floor exercises and hip movements; vaginal lubricants; scar cream; lotions for vulva   Person(s) Educated Patient   Methods Explanation;Demonstration;Verbal cues;Handout   Comprehension Returned demonstration;Verbalized understanding          PT Short Term Goals - 12/21/15 1536      PT SHORT TERM GOAL #1   Title Pt will be independent with initial HEP   Time 4   Period Weeks   Status Achieved     PT SHORT TERM GOAL #2   Title Pt will report vaginal pain reduction by 50%    Time 4   Period Weeks   Status On-going     PT SHORT TERM GOAL #3   Title Pt will report fecal incontinence reduced by 50% due to increased pelvic floor strength   Time 4   Period Weeks   Status On-going  improved by 10%           PT Long Term Goals - 11/28/15 1721      PT LONG TERM GOAL #1   Title Pt will report  urinary or fecal incontinence reduced by 75% due to increased strength and coordination   Time 8   Period Weeks   Status New     PT LONG TERM GOAL #2   Title Pt will perform 5x sit to stand in <11 sec to reduce risk of falls   Time 8   Period Weeks   Status New     PT LONG TERM GOAL #3   Title FOTO < or = 37%   Time 8   Period Weeks   Status New     PT LONG TERM GOAL #4   Title penetration of vaginal canal pain free using lubricants and creams   Time 8   Period Weeks   Status New                Plan - 12/21/15 1612    Clinical Impression Statement Patient is able to contract abdominals without using a sheet due to increased strength.  Patient is able to have a  bowel movement with greater ease.  Patient urinary and fecal leakage decreased by 10%.  Patient has not had intercourse yet.  Patient will benefit from skilled therapy to imporve pelvic floor coordination.    Rehab Potential Excellent   Clinical Impairments Affecting Rehab Potential None   PT Frequency 1x / week   PT Duration 8 weeks   PT Treatment/Interventions Biofeedback;Electrical Stimulation;Therapeutic activities;Therapeutic exercise;Neuromuscular re-education;Patient/family education;Manual techniques   PT Next Visit Plan STM to pelvic floor,    PT Home Exercise Plan progress as needed   Consulted and Agree with Plan of Care Patient      Patient will benefit from skilled therapeutic intervention in order to improve the following deficits and impairments:  Decreased coordination, Decreased range of motion, Decreased mobility, Decreased strength, Hypomobility, Increased muscle spasms, Impaired flexibility, Pain, Obesity  Visit Diagnosis: Muscle weakness (generalized)  Other muscle spasm     Problem List Patient Active Problem List   Diagnosis Date Noted  . Port catheter in place 08/21/2015  . Acute appendicitis 07/22/2015  . Appendicitis with peritonitis 07/22/2015  . Hepatic steatosis 05/29/2015  . Breast cancer metastasized to axillary lymph node ([place]) 02/15/2015  . Chemotherapy-induced peripheral neuropathy ([place]) 02/08/2015  . Macromastia 01/06/2015  . Anemia in neoplastic disease 12/21/2014  . Cough   . Breast cancer of upper-outer quadrant of right female breast ([place]) 07/22/2014  . OSA on CPAP 06/02/2013  . Insulin-requiring or dependent type II diabetes mellitus ([place]) 06/02/2013  . 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 Depression 09/10/2012  . hyperlipidemia  09/10/2012  . Morbid obesity ([place]) 09/03/2012   Earlie Counts, PT 12/21/15 4:15 PM   [facility] [address] [employer] [place], [place], [address] Phone: [phone]   Fax:  [phone]  Name: Darlene [name] MRN: [id number] Date of Birth: 09-22-1950  PHYSICAL THERAPY DISCHARGE SUMMARY  Visits from Start of Care: 3  Current functional level related to goals / functional outcomes: See above. Patient cancelled her appointments due to work conflict. Patient has not called back to reschedule.    Remaining deficits: See above.    Education / Equipment: HEP  Plan:                                                    Patient goals were not met. Patient is being discharged due to not returning since the last visit. Thank you for the referral. Earlie Counts, PT 01/24/16 4:58 PM   ?????

## 2015-12-26 ENCOUNTER — Encounter: Payer: BLUE CROSS/BLUE SHIELD | Admitting: Physical Therapy

## 2015-12-27 ENCOUNTER — Ambulatory Visit: Payer: BLUE CROSS/BLUE SHIELD | Admitting: Dietician

## 2015-12-28 ENCOUNTER — Encounter: Payer: Self-pay | Admitting: Adult Health

## 2015-12-28 ENCOUNTER — Ambulatory Visit (HOSPITAL_BASED_OUTPATIENT_CLINIC_OR_DEPARTMENT_OTHER): Payer: BLUE CROSS/BLUE SHIELD | Admitting: Adult Health

## 2015-12-28 ENCOUNTER — Ambulatory Visit: Payer: BLUE CROSS/BLUE SHIELD | Admitting: Radiation Oncology

## 2015-12-28 VITALS — BP 147/60 | HR 61 | Temp 98.3°F | Resp 18 | Ht 69.0 in | Wt 223.7 lb

## 2015-12-28 DIAGNOSIS — Z171 Estrogen receptor negative status [ER-]: Secondary | ICD-10-CM

## 2015-12-28 DIAGNOSIS — Z853 Personal history of malignant neoplasm of breast: Secondary | ICD-10-CM | POA: Diagnosis not present

## 2015-12-28 DIAGNOSIS — R5383 Other fatigue: Secondary | ICD-10-CM

## 2015-12-28 DIAGNOSIS — C50411 Malignant neoplasm of upper-outer quadrant of right female breast: Secondary | ICD-10-CM

## 2015-12-28 NOTE — Progress Notes (Signed)
CLINIC:  Survivorship   REASON FOR VISIT:  Routine follow-up post-treatment for a recent history of breast cancer.  BRIEF ONCOLOGIC HISTORY:    Breast cancer of upper-outer quadrant of right female breast ([place])   07/19/2014 Initial Biopsy    (R) breast needle biopsy (10:00): IDC, DCIS, grade 2. ER-, PR-, HER2+ (ratio 2.42). Ki67 90%.       07/22/2014 Initial Diagnosis    Breast cancer of upper-outer quadrant of right female breast      08/08/2014 - 11/21/2014 Neo-Adjuvant Chemotherapy    Taxotere/Carbo/Herceptin/Perjeta x 6 cycles.       12/12/2014 - 10/02/2015 Chemotherapy    Maintenance Herceptin (to complete 1 year of therapy).       12/27/2014 Surgery    (R) lumpectomy with SLNB [facility]): IDC, grade 2, spanning 4 cm, associated high grade DCIS. Negative margins. 1/3 SLN (+).   ER/PR repeated and remain negative.   ypT2, ypN1a: Stage IIB      01/06/2015 Surgery    Bilateral breast mammoplasty ([place]): Right breast path with intralymphatic emboli of ductal carcinoma. Left breast benign.       02/02/2015 Imaging    CT chest: No findings of metastatic disease in the chest. Right axillary fluid density lesion likely postoperative seroma or hematoma. Small left subpectoral nodes warrant followup attention.      02/02/2015 Imaging    Bone scan: No scintigraphic evidence of osseous metastatic disease      02/16/2015 Surgery    (R) mastectomy and ALND [facility] Dienes): Scattered foci of high grade DCIS, scattered intralymphatic tumor emboli. Margins negative. 0/15 LNs.       04/05/2015 - 05/19/2015 Radiation Therapy    Adjuvant XRT ([place]). The right chest wall was treated to 50.4 Gy in 28 fractions at 1.8 Gy per fraction.  The right mastectomy scar was treated to 10 Gy in 5 fractions at 2 Gy per fraction.       12/06/2015 PET scan    Right subpectoral lymph node measuring 9 mm and exhibits malignant range FDG uptake, suspicious for metastatic adenopathy. Subcutaneous nodule within  the upper left chest wall exhibits malignant range uptake, which is indeterminate; this is at the site of previous Port-A-Cath and is favored to represent postsurgical change. Indeterminate left adrenal nodules which exhibit mild uptake, favored to represent benign adenoma.       INTERVAL HISTORY:  [name] presents to the [facility] today for our initial meeting to review her survivorship care plan detailing her treatment course for breast cancer, as well as monitoring long-term side effects of that treatment, education regarding health maintenance, screening, and overall wellness and health promotion.     Overall, Darlene [name] reports feeling quite well since completing her last maintenance Herceptin treatment approximately 3 months ago.    She continues to have some fatigue since completing treatment. She tells me she gets 9-10 hours of sleep per night, but still wakes up feeling tired. She does have a h/o sleep apnea and wears her CPAP every night.  She tells me she "doesn't exercise like I should." She is getting a new Fitbit to track her steps and is enrolled in the [place] program that will start in January.   She has urinary incontinence and pelvic floor weakness.  She saw the Pelvic Floor Rehab therapist a few times, but stopped her treatments early because she thinks she can manage the symptoms herself independently.  She has occasional constipation since her gastric bypass surgery; she manages  this with stool softeners/Miralax.    She did the last session of the [facility] ("Finding Your New Normal") program here at the cancer center.  She said the group was very depressing and sad, but she learned a lot.  She did not complete the last few weeks of FYNN because she didn't think the last few topics would have been helpful for her.  She feels like she is overall coping pretty well.  She endorses some anxiety and is worried about her husband.  He has to have an ECHO today and has been  having several cardiac symptoms.  He is 38 years older than her and this has been stressful for her worrying about him.    Otherwise, she feels like she is doing well. She denies any problems with her breasts/chest wall.  She understands there are some areas in her chest that we "are watching" based on her last PET scan in 11/2015.  She states, "But I'm not worrying about that until I have to worry about it."    REVIEW OF SYSTEMS:  Per HPI Breast: Denies any new nodularity, masses, tenderness, nipple changes, or nipple discharge.    A 14-point review of systems was completed and was negative, except as noted above.   ONCOLOGY TREATMENT TEAM:  1. Surgeon:  Dr. Barry Dienes at [facility] 2. Medical Oncologist: Dr. Jana Hakim 3. Radiation Oncologist: Dr. Sondra Come    PAST MEDICAL/SURGICAL HISTORY:  Past Medical History:  Diagnosis Date  . Anemia    hx of  . Anxiety   . Arthritis    in fingers, shoulders  . Balance problem   . Breast cancer ([place])   . Breast cancer of upper-outer quadrant of right female breast ([place]) 07/22/2014  . Clumsiness   . Constipation   . Depression   . Diabetes mellitus without complication ([place])    52+ years  . Eating disorder    binge eating  . Epistaxis 08/26/2014  . Fatty liver   . HCAP (healthcare-associated pneumonia) 12/18/2014  . Headache   . Heart murmur    never had any problems  . Hyperlipidemia   . Hypertension   . Neuromuscular disorder ([place])    diabetic neuropathy  . Obesity   . Obstructive sleep apnea on CPAP    uses cpap nightly  . Ovarian cyst rupture    possible  . Pneumonia   . Pneumonia 11/2014  . Radiation 04/05/15-05/19/15   right chest wall 50.4 Gy, mastectomy scar 10 Gy  . Thyroid nodule 06/2014   Past Surgical History:  Procedure Laterality Date  . BIOPSY THYROID Bilateral 06/2014   2 nodules - Benign   . BREAST LUMPECTOMY WITH RADIOACTIVE SEED AND SENTINEL LYMPH NODE BIOPSY Right 12/27/2014   Procedure: BREAST  LUMPECTOMY WITH RADIOACTIVE SEED AND SENTINEL LYMPH NODE BIOPSY;  Surgeon: Stark Klein, MD;  Location: [facility];  Service: General;  Laterality: Right;  . BREAST REDUCTION SURGERY Bilateral 01/06/2015   Procedure: BILATERAL BREAST REDUCTION, RIGHT ONCOPLASTIC RECONSTRUCTION),LEFT BREAST REDUCTION FOR SYMMETRY;  Surgeon: [name], MD;  Location: [facility];  Service: Plastics;  Laterality: Bilateral;  . BREATH TEK H PYLORI N/A 09/15/2012   Procedure: BREATH TEK H PYLORI;  Surgeon: Pedro Earls, MD;  Location: [facility];  Service: General;  Laterality: N/A;  . BUNIONECTOMY Left 12/2013  . CARPAL TUNNEL RELEASE Right   . CARPAL TUNNEL RELEASE Left   . DUPUYTREN CONTRACTURE RELEASE    . GASTRIC ROUX-EN-Y N/A 11/02/2012  Procedure: LAPAROSCOPIC ROUX-EN-Y GASTRIC;  Surgeon: Pedro Earls, MD;  Location: [facility];  Service: General;  Laterality: N/A;  . LAPAROSCOPIC APPENDECTOMY N/A 07/22/2015   Procedure: APPENDECTOMY LAPAROSCOPIC;  Surgeon: Michael Boston, MD;  Location: [facility];  Service: General;  Laterality: N/A;  . MASTECTOMY Right 02/15/2015   modified  . MODIFIED MASTECTOMY Right 02/15/2015   Procedure: RIGHT MODIFIED RADICAL MASTECTOMY;  Surgeon: Stark Klein, MD;  Location: [facility];  Service: General;  Laterality: Right;  . PORT-A-CATH REMOVAL Left 10/10/2015   Procedure: PORT REMOVAL;  Surgeon: Stark Klein, MD;  Location: [facility];  Service: General;  Laterality: Left;  . PORTACATH PLACEMENT Left 08/02/2014   Procedure: INSERTION PORT-A-CATH;  Surgeon: Stark Klein, MD;  Location: [place];  Service: General;  Laterality: Left;  . ROTATOR CUFF REPAIR Right 2005  . SCAR REVISION Right 12/08/2015   Procedure: COMPLEX REPAIR RIGHT CHEST 10-15CM;  Surgeon: [name], MD;  Location: [facility];  Service: Plastics;  Laterality: Right;  . TOE SURGERY  1970s   bone spur  . TRIGGER FINGER RELEASE     x3     ALLERGIES:  Allergies    Allergen Reactions  . Nsaids Other (See Comments)    H/o gastric bypass - avoid NSAIDs  . Peanuts [Peanut Oil] Itching    "Large amounts"  . Tape Hives and Rash    Adhesives in bandaids     CURRENT MEDICATIONS:  Outpatient Encounter Prescriptions as of 12/28/2015  Medication Sig Note  . acetaminophen (TYLENOL) 325 MG tablet Take 650 mg by mouth 2 (two) times daily as needed for moderate pain or headache. Reported on 04/17/2015   . calcium citrate-vitamin D (CITRACAL+D) 315-200 MG-UNIT tablet Take 1 tablet by mouth 3 (three) times daily.   [name] glucose blood (ONETOUCH VERIO) test strip 1 each by Other route as needed for other. Reported on 03/06/2015   . losartan (COZAAR) 100 MG tablet Take 1 tablet by mouth daily. Reported on 03/06/2015   . Melatonin 5 MG TABS Take 5 mg by mouth at bedtime. Reported on 03/06/2015   . metoCLOPramide (REGLAN) 5 MG tablet TAKE 1 TABLET THREE TIMES DAILY BEFORE MEALS.   [name] NOVOLIN 70/30 (70-30) 100 UNIT/ML injection Inject 5-10 Units into the skin 2 (two) times daily with a meal. Reported on 03/06/2015 10/10/2015: Took 10 units  . Pediatric Multivitamins-Iron (FLINTSTONES PLUS IRON) chewable tablet Chew 1 tablet by mouth 2 (two) times daily.   . Polyethylene Glycol 3350 (MIRALAX PO) Take 1 packet by mouth as needed.   . Probiotic Product ([employer]S PROBIOTIC PO) Take 1 capsule by mouth daily. Reported on 03/06/2015   . Propylene Glycol (SYSTANE BALANCE OP) Apply 1-2 drops to eye 3 (three) times daily.   . Terbinafine HCl (LAMISIL PO) Take by mouth.   . Vortioxetine HBr (TRINTELLIX) 20 MG TABS Take 20 mg by mouth at bedtime.    . diphenhydrAMINE (BENADRYL) 25 MG tablet Take 25 mg by mouth daily as needed for allergies or sleep. Reported on 03/06/2015   . Docusate Sodium (COLACE PO) Take by mouth. Takes a day   . mometasone (NASONEX) 50 MCG/ACT nasal spray Place 1-2 sprays into the nose daily as needed (for allergies.).     No facility-administered encounter medications on  file as of 12/28/2015.      ONCOLOGIC FAMILY HISTORY:  Family History  Problem Relation Age of Onset  . CVA Mother   . Diabetes Father   . Heart  failure Father   . Hypertension Sister   . Diabetes Brother   . Breast cancer Paternal Grandmother   . Diabetes Paternal Uncle      GENETIC COUNSELING/TESTING: None.   SOCIAL HISTORY:  Darlene [name] is married and lives with her husband in [place], [place]. 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 She has 2 stepchildren and 1 grandchild from her husband's previous relationship. She denies any current tobacco or illicit drug use.  She drinks alcohol occasionally.    PHYSICAL EXAMINATION:  Vital Signs:   Vitals:   12/28/15 0930  BP: (!) 147/60  Pulse: 61  Resp: 18  Temp: 98.3 F (36.8 C)   Filed Weights   12/28/15 0930  Weight: 223 lb 11.2 oz (101.5 kg)   General: Well-nourished, well-appearing female in no acute distress.  She is unaccompanied today.   HEENT: Head is normocephalic.  Pupils equal and reactive to light. Conjunctivae clear without exudate.  Sclerae anicteric. Oral mucosa is pink, moist.  Oropharynx is pink without lesions or erythema.  Lymph: No cervical, supraclavicular, or infraclavicular lymphadenopathy noted on palpation.  Cardiovascular: Regular rate and rhythm.[name] Respiratory: Clear to auscultation bilaterally. Chest expansion symmetric; breathing non-labored.  GI: Abdomen soft and round; non-tender, non-distended. Bowel sounds normoactive.  GU: Deferred.  Neuro: No focal deficits. Steady gait.  Psych: Mood and affect normal and appropriate for situation.  Extremities: No edema. Skin: Warm and dry.  LABORATORY DATA:  None for this visit.  DIAGNOSTIC IMAGING:  None for this visit.      ASSESSMENT AND PLAN:  Darlene [name] is a pleasant 65 y.o. female with Stage IIB right breast invasive ductal carcinoma, ER-/PR-/HER2+, diagnosed in 06/2014;  treated with neoadjuvant  chemotherapy with Taxotere/Carbo/Herceptin/Perjeta x 6 cycles. She then had lumpectomy with sentinel lymph node biopsy and 1/3 SLN were positive.  Later, she had bilateral breats mammoplasty and pathology revealed residual carcinoma. Therefore, she had right mastectomy with axillary lymph node dissection, followed by adjuvant radiation therapy. She completed radiation on 05/19/15. She completed 1 year of maintenance Herceptin treatment; last dose was on 10/02/15.  She presents to the Survivorship Clinic for our initial meeting and routine follow-up post-completion of treatment for breast cancer.    1. Stage IIB right breast cancer:  Darlene [name] is continuing to recover from definitive treatment for breast cancer. Her left breast unilateral [facility] in 10/2015 was negative.  She will follow-up with her medical oncologist, Dr. Jana Hakim in 02/2016 with history and physical exam per surveillance protocol.  She had a PET scan on 12/06/15 which showed a right subpectoral lymph node and left chest wall subcutaneous nodule that were both within malignant range FDG uptake on PET scan.  Per Dr. Virgie Dad note, they will follow-up with repeat PET scan in 3 months for further evaluation.  Today, a comprehensive survivorship care plan and treatment summary was reviewed with the patient today detailing her breast cancer diagnosis, treatment course, potential late/long-term effects of treatment, appropriate follow-up care with recommendations for the future, and patient education resources.  A copy of this summary, along with a letter will be sent to the patient's primary care provider via mail/fax/In Basket message after today's visit.    *Her PCP has changed from Dr. Noah Delaine to Dr. Yaakov Guthrie. The "care team" was updated.   2. Fatigue: She continues to have quite a bit of fatigue since completing treatment. She endorses that she does not exercise "as often as I should."  She tells me she is scheduled to  participate in the [place] program through the [facility], which may certainly help with any treatment-related fatigue. She also has a history of sleep apnea and wears her CPAP every night.  Encouraged her to follow up with her sleep specialist to monitor her setting to ensure they are adequate.   3. Adjustment disorder with anxious mood: Overall, Darlene [name] appears to be coping well, despite the uncertainty of the lymph node and nodule in her chest that are being followed with interval imaging.  She has already taken advantage of several of our resources here at the cancer center, which is great.  She participated in most sessions of the most recent [facility] series; she shared with me that she learned a lot and realized "I feel like I'm doing better than many of the women in that group and I'm thankful."  One of her biggest concerns now is her husband's health and their role reversal in her needing to be the caregiver for him now.  She was offered support today through active listening, validation of her concerns, and expressive supportive counseling.  She was given information regarding our available services and encouraged to contact me with any questions or for help enrolling in any of our support group/programs.   4. Bone health:  Given Darlene [name]'s age & history of breast cancer, she is at risk for bone demineralization.  Her last DEXA scan was 07/05/13 and was normal. Given that she will not require adjuvant anti-estrogen therapy, I will defer any future bone mineral density testing to her medical oncologist or PCP, as clinically indicated.  In the meantime, she was encouraged to increase her consumption of foods rich in calcium, as well as increase her weight-bearing activities.  She was given education on specific activities to promote bone health.  5. Cancer screening:  Due to Darlene [name]'s history and her age, she should receive screening for skin cancers, colon cancer, and gynecologic cancers.  The  information and recommendations are listed on the patient's comprehensive care plan/treatment summary and were reviewed in detail with the patient.    6. Health maintenance and wellness promotion: Darlene [name] was encouraged to consume 5-7 servings of fruits and vegetables per day. We reviewed the "Nutrition Rainbow" handout, as well as the handout "Take Control of Your Health and Reduce Your Cancer Risk" from the [place].  She was also encouraged to engage in moderate to vigorous exercise for 30 minutes per day most days of the week. We discussed the [employer] fitness program, which is designed for cancer survivors to help them become more physically fit after cancer treatments. She is scheduled to start [place] in January and is excited about this program.  She was instructed to limit her alcohol consumption and continue to abstain from tobacco use.     Dispo:   -Return to cancer center to see Dr. Jana Hakim in 02/2016. -She is welcome to return back to the Survivorship Clinic at any time; no additional follow-up needed at this time.  -Consider referral back to survivorship as a long-term survivor for continued surveillance  A total of 45 minutes of face-to-face time was spent with this patient with greater than 50% of that time in counseling and care-coordination.   Mike Craze, NP Survivorship Program [facility] [phone]   Note: PRIMARY CARE PROVIDER Anthoney Harada, [place] [phone]

## 2016-01-01 ENCOUNTER — Ambulatory Visit
Admission: RE | Admit: 2016-01-01 | Discharge: 2016-01-01 | Disposition: A | Discharge status: Home / Self Care | Payer: BLUE CROSS/BLUE SHIELD | Source: Ambulatory Visit | Attending: Radiation Oncology | Admitting: Radiation Oncology

## 2016-01-01 ENCOUNTER — Encounter: Payer: Self-pay | Admitting: Radiation Oncology

## 2016-01-01 VITALS — BP 160/80 | HR 64 | Temp 98.4°F | Ht 69.0 in | Wt 224.2 lb

## 2016-01-01 DIAGNOSIS — Z9011 Acquired absence of right breast and nipple: Secondary | ICD-10-CM | POA: Diagnosis not present

## 2016-01-01 DIAGNOSIS — Z886 Allergy status to analgesic agent status: Secondary | ICD-10-CM | POA: Diagnosis not present

## 2016-01-01 DIAGNOSIS — Z923 Personal history of irradiation: Secondary | ICD-10-CM | POA: Insufficient documentation

## 2016-01-01 DIAGNOSIS — Z171 Estrogen receptor negative status [ER-]: Secondary | ICD-10-CM | POA: Insufficient documentation

## 2016-01-01 DIAGNOSIS — C50411 Malignant neoplasm of upper-outer quadrant of right female breast: Secondary | ICD-10-CM

## 2016-01-01 DIAGNOSIS — Z91048 Other nonmedicinal substance allergy status: Secondary | ICD-10-CM | POA: Insufficient documentation

## 2016-01-01 DIAGNOSIS — Z91010 Allergy to peanuts: Secondary | ICD-10-CM | POA: Diagnosis not present

## 2016-01-01 NOTE — Progress Notes (Signed)
Darlene [name] is here for follow up.  She denies having pain and fatigue.  She had her last mammogram in October.  The skin on her right chest has slight hyperpigmentation.  She had a scar revision done on 12/08/15.  BP (!) 160/80 (BP Location: Left Arm, Patient Position: Sitting)   Pulse 64   Temp 98.4 F (36.9 C) (Oral)   Ht 5\' 9"  (1.753 m)   Wt 224 lb 3.2 oz (101.7 kg)   LMP 10/22/2007   SpO2 98%   BMI 33.11 kg/m    Wt Readings from Last 3 Encounters:  01/01/16 224 lb 3.2 oz (101.7 kg)  12/28/15 223 lb 11.2 oz (101.5 kg)  12/08/15 214 lb (97.1 kg)

## 2016-01-01 NOTE — Progress Notes (Signed)
Radiation Oncology         [phone] ________________________________  Name: Darlene [name] MRN: [id number]  Date: 01/01/2016  DOB: 02/17/1950  Follow-Up Visit Note  CC: Anthoney Harada, MD  Stark Klein, MD    ICD-9-CM ICD-10-CM   1. Malignant neoplasm of upper-outer quadrant of right breast in female, estrogen receptor negative ([place]) 174.4 C50.411    V86.1 Z17.1     Diagnosis: ypT2 ypN1a, stage IIB invasive ductal carcinoma, grade 2 of the right breast  Interval Since Last Radiation: 7 months  04/05/2015-05/19/2015:  1. The right chest wall was treated to 50.4 Gy in 28 fractions at 1.8 Gy per fraction. 2. The right mastectomy scar was treated to 10 Gy in 5 fractions at 2 Gy per fraction.  Narrative:  The patient returns today for routine follow-up. She denies pain or fatigue. She reports her last mammogram was in October. Per nursing, skin on her right chest has slight hyperpigmentation. Scar revision was completed on 12/08/15.  ALLERGIES:  is allergic to nsaids; peanuts [peanut oil]; and tape.  Meds: Current Outpatient Prescriptions  Medication Sig Dispense Refill  . acetaminophen (TYLENOL) 325 MG tablet Take 650 mg by mouth 2 (two) times daily as needed for moderate pain or headache. Reported on 04/17/2015    . calcium citrate-vitamin D (CITRACAL+D) 315-200 MG-UNIT tablet Take 1 tablet by mouth 3 (three) times daily.    . diphenhydrAMINE (BENADRYL) 25 MG tablet Take 25 mg by mouth daily as needed for allergies or sleep. Reported on 03/06/2015    . Docusate Sodium (COLACE PO) Take by mouth. Takes a day    . glucose blood (ONETOUCH VERIO) test strip 1 each by Other route as needed for other. Reported on 03/06/2015    . losartan (COZAAR) 100 MG tablet Take 1 tablet by mouth daily. Reported on 03/06/2015    . Melatonin 5 MG TABS Take 5 mg by mouth at bedtime. Reported on 03/06/2015    . metoCLOPramide (REGLAN) 5 MG tablet TAKE 1 TABLET THREE TIMES DAILY BEFORE MEALS. 90  tablet 0  . mometasone (NASONEX) 50 MCG/ACT nasal spray Place 1-2 sprays into the nose daily as needed (for allergies.).     [name] NOVOLIN 70/30 (70-30) 100 UNIT/ML injection Inject 5-10 Units into the skin 2 (two) times daily with a meal. Reported on 03/06/2015    . Pediatric Multivitamins-Iron (FLINTSTONES PLUS IRON) chewable tablet Chew 1 tablet by mouth 2 (two) times daily.    . Polyethylene Glycol 3350 (MIRALAX PO) Take 1 packet by mouth as needed.    . Probiotic Product ([employer] PROBIOTIC PO) Take 1 capsule by mouth daily. Reported on 03/06/2015    . Propylene Glycol (SYSTANE BALANCE OP) Apply 1-2 drops to eye 3 (three) times daily.    . Terbinafine HCl (LAMISIL PO) Take by mouth.    . Vortioxetine HBr (TRINTELLIX) 20 MG TABS Take 20 mg by mouth at bedtime.      No current facility-administered medications for this encounter.     Physical Findings: The patient is in no acute distress. Patient is alert and oriented.  height is 5\' 9"  (1.753 m) and weight is 224 lb 3.2 oz (101.7 kg). Her oral temperature is 98.4 F (36.9 C). Her blood pressure is 160/80 (abnormal) and her pulse is 64. Her oxygen saturation is 98%.   Lungs are clear to auscultation bilaterally. Heart has regular rate and rhythm. No palpable cervical, supraclavicular, or axillary adenopathy. Mastectomy scar on the right chest wall  without palpable or visible signs of recurrence. Left breast reduction mammoplasty scars in place. No palpable or visible signs of cancer on the left side.   Lab Findings: Lab Results  Component Value Date   WBC 5.8 12/06/2015   HGB 11.6 (L) 12/08/2015   HCT 34.0 (L) 12/08/2015   MCV 93.1 12/06/2015   PLT 176 12/06/2015    Radiographic Findings: Nm Pet Image Initial (pi) Skull Base To Thigh  Result Date: 12/06/2015 CLINICAL DATA:  Subsequent treatment strategy for breast cancer. EXAM: NUCLEAR MEDICINE PET SKULL BASE TO THIGH TECHNIQUE: 10.68 mCi F-18 FDG was injected intravenously. Full-ring PET  imaging was performed from the skull base to thigh after the radiotracer. CT data was obtained and used for attenuation correction and anatomic localization. FASTING BLOOD GLUCOSE:  Value: 120 mg/dl COMPARISON:  None FINDINGS: NECK No hypermetabolic lymph nodes in the neck. CHEST Within the upper left chest wall there is a subcutaneous nodule measuring 1.5 cm, image 52 of series 4. SUV max equals 6.4. Right retropectoral lymph node is identified measuring 9 mm, image 54 of series 4. SUV max equals 5.5. Postsurgical changes from right mastectomy and right axillary nodal dissection identified. Focal area of increased soft tissue adjacent to right axillary surgical clip measures 1.5 cm and has an SUV max equal to 3.49, image 64 of series 4. Aortic atherosclerosis noted. Calcifications in the RCA, LAD and left circumflex coronary arteries identified. No hypermetabolic mediastinal or hilar nodes. No suspicious pulmonary nodules on the CT scan. ABDOMEN/PELVIS No abnormal hypermetabolic activity within the liver, pancreas, adrenal glands, or spleen. Gallstones are identified. There is a stone within the inferior pole of the left kidney. Nodule in the left adrenal gland measures 1.4 cm and exhibits mild FDG uptake, SUV max equals 3.7, image 107 of series 4. Portacaval node measures 1.4 cm and has an SUV max equal to 5.6. SKELETON No focal hypermetabolic activity to suggest skeletal metastasis. IMPRESSION: 1. Postsurgical changes from recent right mastectomy and nodal dissection noted. 2. There is a right subpectoral lymph node which measures 9 mm and exhibits malignant range FDG uptake. Suspicious for metastatic adenopathy. 3. Subcutaneous nodule within the upper left chest wall exhibits malignant range FDG uptake. Indeterminate. This is at the site of previous porta catheter and is favored to represent postsurgical change. 4. Aortic atherosclerosis and multi vessel coronary artery calcification. 5. Indeterminate left  adrenal nodules identified which exhibits mild FDG uptake. This is favored to represent a benign adenoma. Confirmatory imaging with adrenal protocol MRI should be considered. Electronically Signed   By: Kerby Moors M.D.   On: 12/06/2015 11:09    Impression:  The patient is recovering from the effects of radiation. No evidence of recurrence on clinical exam today. Recent PET CT scan questioned a right subpectoral lymph node.  Plan: Prn follow up with radiation oncology. Patient will have close follow up with medical oncology. She will likely have a repeat PET/CT scan after her follow-up with Dr. Jana Hakim in February to follow-up on the questionable subpectoral lymph node.  ____________________________________ -----------------------------------  Blair Promise, PhD, MD  This document serves as a record of services personally performed by Gery Pray, MD. It was created on his behalf by Bethann Humble, a trained medical scribe. The creation of this record is based on the scribe's personal observations and the provider's statements to them. This document has been checked and approved by the attending provider.

## 2016-01-04 ENCOUNTER — Encounter: Payer: BLUE CROSS/BLUE SHIELD | Admitting: Physical Therapy

## 2016-01-11 ENCOUNTER — Encounter: Payer: BLUE CROSS/BLUE SHIELD | Admitting: Physical Therapy

## 2016-02-08 ENCOUNTER — Ambulatory Visit: Payer: BLUE CROSS/BLUE SHIELD | Admitting: Neurology

## 2016-02-27 ENCOUNTER — Other Ambulatory Visit: Payer: Self-pay | Admitting: *Deleted

## 2016-02-27 DIAGNOSIS — R948 Abnormal results of function studies of other organs and systems: Secondary | ICD-10-CM

## 2016-02-27 DIAGNOSIS — C50411 Malignant neoplasm of upper-outer quadrant of right female breast: Secondary | ICD-10-CM

## 2016-02-27 DIAGNOSIS — Z171 Estrogen receptor negative status [ER-]: Secondary | ICD-10-CM

## 2016-02-27 DIAGNOSIS — C773 Secondary and unspecified malignant neoplasm of axilla and upper limb lymph nodes: Secondary | ICD-10-CM

## 2016-02-27 DIAGNOSIS — C50911 Malignant neoplasm of unspecified site of right female breast: Secondary | ICD-10-CM

## 2016-03-01 ENCOUNTER — Other Ambulatory Visit: Payer: Self-pay | Admitting: *Deleted

## 2016-03-06 ENCOUNTER — Other Ambulatory Visit: Payer: Self-pay | Admitting: Oncology

## 2016-03-06 ENCOUNTER — Other Ambulatory Visit: Payer: Self-pay | Admitting: *Deleted

## 2016-03-06 DIAGNOSIS — C50411 Malignant neoplasm of upper-outer quadrant of right female breast: Secondary | ICD-10-CM

## 2016-03-06 DIAGNOSIS — Z171 Estrogen receptor negative status [ER-]: Secondary | ICD-10-CM

## 2016-03-06 DIAGNOSIS — C773 Secondary and unspecified malignant neoplasm of axilla and upper limb lymph nodes: Secondary | ICD-10-CM

## 2016-03-06 DIAGNOSIS — C50911 Malignant neoplasm of unspecified site of right female breast: Secondary | ICD-10-CM

## 2016-03-06 DIAGNOSIS — R948 Abnormal results of function studies of other organs and systems: Secondary | ICD-10-CM

## 2016-03-07 ENCOUNTER — Other Ambulatory Visit (HOSPITAL_BASED_OUTPATIENT_CLINIC_OR_DEPARTMENT_OTHER): Payer: Commercial Managed Care - PPO

## 2016-03-07 ENCOUNTER — Ambulatory Visit: Payer: BLUE CROSS/BLUE SHIELD | Admitting: Oncology

## 2016-03-07 DIAGNOSIS — C50411 Malignant neoplasm of upper-outer quadrant of right female breast: Secondary | ICD-10-CM

## 2016-03-07 DIAGNOSIS — C50911 Malignant neoplasm of unspecified site of right female breast: Secondary | ICD-10-CM

## 2016-03-07 DIAGNOSIS — C773 Secondary and unspecified malignant neoplasm of axilla and upper limb lymph nodes: Secondary | ICD-10-CM

## 2016-03-07 DIAGNOSIS — Z171 Estrogen receptor negative status [ER-]: Secondary | ICD-10-CM

## 2016-03-07 LAB — COMPREHENSIVE METABOLIC PANEL
ALT: 24 U/L (ref 0–55)
AST: 23 U/L (ref 5–34)
Albumin: 3.6 g/dL (ref 3.5–5.0)
Alkaline Phosphatase: 73 U/L (ref 40–150)
Anion Gap: 7 mEq/L (ref 3–11)
BUN: 28.3 mg/dL — ABNORMAL HIGH (ref 7.0–26.0)
CO2: 25 mEq/L (ref 22–29)
Calcium: 9.3 mg/dL (ref 8.4–10.4)
Chloride: 109 mEq/L (ref 98–109)
Creatinine: 1.2 mg/dL — ABNORMAL HIGH (ref 0.6–1.1)
EGFR: 47 mL/min/{1.73_m2} — ABNORMAL LOW (ref >90)
Glucose: 130 mg/dl (ref 70–140)
Potassium: 4.8 mEq/L (ref 3.5–5.1)
Sodium: 141 mEq/L (ref 136–145)
Total Bilirubin: 0.65 mg/dL (ref 0.20–1.20)
Total Protein: 6.4 g/dL (ref 6.4–8.3)

## 2016-03-07 LAB — CBC WITH DIFFERENTIAL/PLATELET
BASO%: 0.5 % (ref 0.0–2.0)
Basophils Absolute: 0 10*3/uL (ref 0.0–0.1)
EOS%: 2.4 % (ref 0.0–7.0)
Eosinophils Absolute: 0.1 10*3/uL (ref 0.0–0.5)
HCT: 33.1 % — ABNORMAL LOW (ref 34.8–46.6)
HGB: 11.6 g/dL (ref 11.6–15.9)
LYMPH%: 26.1 % (ref 14.0–49.7)
MCH: 31.9 pg (ref 25.1–34.0)
MCHC: 34.9 g/dL (ref 31.5–36.0)
MCV: 91.4 fL (ref 79.5–101.0)
MONO#: 0.4 10*3/uL (ref 0.1–0.9)
MONO%: 8 % (ref 0.0–14.0)
NEUT#: 3 10*3/uL (ref 1.5–6.5)
NEUT%: 63 % (ref 38.4–76.8)
Platelets: 203 10*3/uL (ref 145–400)
RBC: 3.62 10*6/uL — ABNORMAL LOW (ref 3.70–5.45)
RDW: 13.8 % (ref 11.2–14.5)
WBC: 4.7 10*3/uL (ref 3.9–10.3)
lymph#: 1.2 10*3/uL (ref 0.9–3.3)

## 2016-03-11 ENCOUNTER — Ambulatory Visit (HOSPITAL_COMMUNITY)
Admission: RE | Admit: 2016-03-11 | Discharge: 2016-03-11 | Disposition: A | Discharge status: Home / Self Care | Payer: Commercial Managed Care - PPO | Source: Ambulatory Visit | Attending: Oncology | Admitting: Oncology

## 2016-03-11 DIAGNOSIS — C773 Secondary and unspecified malignant neoplasm of axilla and upper limb lymph nodes: Secondary | ICD-10-CM | POA: Diagnosis not present

## 2016-03-11 DIAGNOSIS — Z171 Estrogen receptor negative status [ER-]: Secondary | ICD-10-CM | POA: Diagnosis not present

## 2016-03-11 DIAGNOSIS — C50411 Malignant neoplasm of upper-outer quadrant of right female breast: Secondary | ICD-10-CM | POA: Diagnosis present

## 2016-03-11 LAB — GLUCOSE, CAPILLARY: Glucose-Capillary: 131 mg/dL — ABNORMAL HIGH (ref 65–99)

## 2016-03-11 MED ORDER — FLUDEOXYGLUCOSE F - 18 (FDG) INJECTION
11.2100 | Freq: Once | INTRAVENOUS | Status: AC | PRN
  Administered 2016-03-11: 11.21 via INTRAVENOUS

## 2016-03-12 ENCOUNTER — Ambulatory Visit (HOSPITAL_BASED_OUTPATIENT_CLINIC_OR_DEPARTMENT_OTHER): Payer: Commercial Managed Care - PPO | Admitting: Oncology

## 2016-03-12 ENCOUNTER — Telehealth: Payer: Self-pay | Admitting: *Deleted

## 2016-03-12 VITALS — BP 144/56 | HR 55 | Temp 98.0°F | Resp 18 | Wt 229.6 lb

## 2016-03-12 DIAGNOSIS — C50911 Malignant neoplasm of unspecified site of right female breast: Secondary | ICD-10-CM

## 2016-03-12 DIAGNOSIS — C773 Secondary and unspecified malignant neoplasm of axilla and upper limb lymph nodes: Secondary | ICD-10-CM

## 2016-03-12 DIAGNOSIS — C50411 Malignant neoplasm of upper-outer quadrant of right female breast: Secondary | ICD-10-CM | POA: Diagnosis not present

## 2016-03-12 DIAGNOSIS — Z171 Estrogen receptor negative status [ER-]: Secondary | ICD-10-CM

## 2016-03-12 DIAGNOSIS — C7989 Secondary malignant neoplasm of other specified sites: Secondary | ICD-10-CM | POA: Diagnosis not present

## 2016-03-12 NOTE — Telephone Encounter (Signed)
Spoke to pt concerning future appts. Discussed will f/u with chemo appts for 03/19/16. Denies further needs at this time. Gave emotional support and encouragement.

## 2016-03-12 NOTE — Progress Notes (Signed)
Breast - No Medical Intervention - Off Treatment.  Patient Characteristics: Metastatic Chemotherapy, HER2/neu Positive, ER -Darlene [name], First Line AJCC Stage Grouping: IV Current Disease Status: Locoregional Recurrent Disease - Unresected AJCC M Stage: 1 ER Status: Negative (-) AJCC N Stage: X AJCC T Stage: X HER2/neu: Positive (+) PR Status: Negative (-) Line of therapy: First Line Would you be surprised if this patient died  in the next year? I would be surprised if this patient died in the next year

## 2016-03-12 NOTE — Progress Notes (Signed)
DISCONTINUE ON PATHWAY REGIMEN - Breast  No Medical Intervention - Off Treatment.  REASON: Disease Progression PRIOR TREATMENT: Off Treatment  Breast - No Medical Intervention - Off Treatment.  Patient Characteristics: AJCC Stage Grouping: IV Current Disease Status: Locoregional Recurrent Disease - Unresected AJCC M Stage: 1 ER Status: Negative (-) AJCC N Stage: X AJCC T Stage: X HER2/neu: Positive (+) PR Status: Negative (-)

## 2016-03-12 NOTE — Progress Notes (Signed)
DISCONTINUE OFF PATHWAY REGIMEN - Breast  No Medical Intervention - Off Treatment.  REASON: Disease Progression PRIOR TREATMENT: Off Treatment  START OFF PATHWAY REGIMEN - Breast  Off Pathway: Ado-Trastuzumab Emtansine q21 Days  [id number]:Ado-Trastuzumab Emtansine q21 Days:   A cycle is every 21 days:     Ado-trastuzumab emtansine (Kadcyla (R) (T-DM1)) 3.6 mg/kg in 250 mL NS IV over 90 minutes, q21 days. Subsequent infusions may be administered over 30 minutes if prior infusions well-tolerated. Use 0.22 micron in-line PES filter.       Dose Mod: None         Additional Orders: Observe patient for at least 90 minutes after first infusion and at least 30 minutes after subsequent infusions. LVEF assessment recommended at baseline and q3-4 months. Additional lab monitoring includes include CBC and CMP.  **Always confirm dose/schedule in your pharmacy ordering system**    Patient Characteristics: Metastatic Chemotherapy, HER2/neu Positive, ER -Verita Schneiders, First Line AJCC Stage Grouping: IV Current Disease Status: Locoregional Recurrent Disease - Unresected AJCC M Stage: 1 ER Status: Negative (-) AJCC N Stage: X AJCC T Stage: X HER2/neu: Positive (+) PR Status: Negative (-) Line of therapy: First Line Would you be surprised if this patient died  in the next year? I would be surprised if this patient died in the next year  Intent of Therapy: Curative Intent, Discussed with Patient

## 2016-03-12 NOTE — Progress Notes (Signed)
[place]  [phone] [phone]   ID: LINZI OHLINGER DOB: 21-Dec-1950  MR#: [id number]  WSF#:[id number]  Patient Care Team: Vernie Shanks, MD as PCP - General (Family Medicine) Bryson Ha Himmelrich, RD (Inactive) as Dietitian (Bariatrics) Stark Klein, MD as Consulting Physician (General Surgery) Chauncey Cruel, MD as Consulting Physician (Oncology) Arloa Koh, MD as Consulting Physician (Radiation Oncology) Mauro Kaufmann, RN as Registered Nurse Rockwell Germany, RN as Registered Nurse Jacelyn Pi, MD as Consulting Physician (Endocrinology) Kem Boroughs, FNP as Nurse Practitioner (Family Medicine) [name], MD as Consulting Physician (Plastic Surgery) Larey Dresser, MD as Consulting Physician (Cardiology) PCP: Anthoney Harada, MD OTHER MD:  CHIEF COMPLAINT:  HER-2 positive, estrogen receptor negative locally recurrent disease  CURRENT TREATMENT:  T-DM1  INTERVAL HISTORY:  [name] returns today for follow-up of her HER-2/neu positive breast cancer accompanied by her husband, [name]. We have been following a right retropectoral lesion noted on PET scan 3 months ago. We just repeated the PET scan and the lesion has increased from 0.9 to 1.4 cmin the interval. The SUV max is also increased. There is a second lesion which is right next to a scar. This is no larger and is actually dimmer so hopefully that will not be a second area of involvement. We don't see any evidence of active disease in the lungs liver or bones or anywhere else in either PET scan.  Since her last visit with me she also had a scar revision on the right. The pathology from that procedure was benign (SZA [phone]).   REVIEW OF SYSTEMS: [name] continues to work full-time. She is also going to the [employer] and enjoying it. She N [name] went to [place] for the holidays and enjoyed that as well. They're looking for to many trips in the future especially  once she retires at age 35. She tells me her diabetes is currently well-controlled. She has no symptoms related to her area of local recurrence.A detailed review of systems today was otherwise stable  BREAST CANCER HISTORY: From the original intake note:  "[name]" had screening mammography at her gynecologist suggestive of a possible abnormality in the right breast. However right diagnostic mammogram 04/12/2013 at [facility] was negative. More recently however, she noted a lump in her right breast and brought it to her gynecologist's attention. On 07/18/2014 the patient had bilateral diagnostic mammography with tomosynthesis at [facility]. The breast density was category B. In the right breast upper outer quadrant there was an area of focal asymmetry with indistinct margins.[name] Ultrasound was obtained and showed in addition to the mass in question, measuring 1.7 cm, an abnormal-appearing lymph node in the right axillary tail.  On 07/19/2014 the patient underwent biopsy of the right breast massin question as well as a right axillary lymph node. Both were positive for invasive ductal carcinoma, grade 2, estrogen and progesterone receptor negative, with an MIB-1 of 90%, and HER-2 pending. Incidentally the lymph node biopsy showed a lymphocytic inflammatory component but no lymph node architecture.  Her subsequent history is as detailed below.  PAST MEDICAL HISTORY: Past Medical History:  Diagnosis Date  . Anemia    hx of  . Anxiety   . Arthritis    in fingers, shoulders  . Balance problem   . Breast cancer ([place])   . Breast cancer of upper-outer quadrant of right female breast ([place]) 07/22/2014  . Clumsiness   . Constipation   . Depression   .  Diabetes mellitus without complication ([place])    93+ years  . Eating disorder    binge eating  . Epistaxis 08/26/2014  . Fatty liver   . HCAP (healthcare-associated pneumonia) 12/18/2014  . Headache   . Heart murmur    never had any problems  . Hyperlipidemia   .  Hypertension   . Neuromuscular disorder ([place])    diabetic neuropathy  . Obesity   . Obstructive sleep apnea on CPAP    uses cpap nightly  . Ovarian cyst rupture    possible  . Pneumonia   . Pneumonia 11/2014  . Radiation 04/05/15-05/19/15   right chest wall 50.4 Gy, mastectomy scar 10 Gy  . Thyroid nodule 06/2014    PAST SURGICAL HISTORY: Past Surgical History:  Procedure Laterality Date  . BIOPSY THYROID Bilateral 06/2014   2 nodules - Benign   . BREAST LUMPECTOMY WITH RADIOACTIVE SEED AND SENTINEL LYMPH NODE BIOPSY Right 12/27/2014   Procedure: BREAST LUMPECTOMY WITH RADIOACTIVE SEED AND SENTINEL LYMPH NODE BIOPSY;  Surgeon: Stark Klein, MD;  Location: [place];  Service: General;  Laterality: Right;  . BREAST REDUCTION SURGERY Bilateral 01/06/2015   Procedure: BILATERAL BREAST REDUCTION, RIGHT ONCOPLASTIC RECONSTRUCTION),LEFT BREAST REDUCTION FOR SYMMETRY;  Surgeon: [name], MD;  Location: [place];  Service: Plastics;  Laterality: Bilateral;  . BREATH TEK H PYLORI N/A 09/15/2012   Procedure: BREATH TEK H PYLORI;  Surgeon: Pedro Earls, MD;  Location: [facility];  Service: General;  Laterality: N/A;  . BUNIONECTOMY Left 12/2013  . CARPAL TUNNEL RELEASE Right   . CARPAL TUNNEL RELEASE Left   . DUPUYTREN CONTRACTURE RELEASE    . GASTRIC ROUX-EN-Y N/A 11/02/2012   Procedure: LAPAROSCOPIC ROUX-EN-Y GASTRIC;  Surgeon: Pedro Earls, MD;  Location: [facility];  Service: General;  Laterality: N/A;  . LAPAROSCOPIC APPENDECTOMY N/A 07/22/2015   Procedure: APPENDECTOMY LAPAROSCOPIC;  Surgeon: Michael Boston, MD;  Location: [facility];  Service: General;  Laterality: N/A;  . MASTECTOMY Right 02/15/2015   modified  . MODIFIED MASTECTOMY Right 02/15/2015   Procedure: RIGHT MODIFIED RADICAL MASTECTOMY;  Surgeon: Stark Klein, MD;  Location: [place];  Service: General;  Laterality: Right;  . PORT-A-CATH REMOVAL Left 10/10/2015   Procedure: PORT REMOVAL;  Surgeon: Stark Klein,  MD;  Location: [place];  Service: General;  Laterality: Left;  . PORTACATH PLACEMENT Left 08/02/2014   Procedure: INSERTION PORT-A-CATH;  Surgeon: Stark Klein, MD;  Location: [place];  Service: General;  Laterality: Left;  . ROTATOR CUFF REPAIR Right 2005  . SCAR REVISION Right 12/08/2015   Procedure: COMPLEX REPAIR RIGHT CHEST 10-15CM;  Surgeon: [name], MD;  Location: [place];  Service: Plastics;  Laterality: Right;  . TOE SURGERY  1970s   bone spur  . TRIGGER FINGER RELEASE     x3    FAMILY HISTORY Family History  Problem Relation Age of Onset  . CVA Mother   . Diabetes Father   . Heart failure Father   . Hypertension Sister   . Diabetes Brother   . Breast cancer Paternal Grandmother   . Diabetes Paternal Uncle    the patient's father died from complications of diabetes at age 63. The patient's mother died at age 62 with a subarachnoid hemorrhage. The patient had one brother, one sister. The only breast cancer was the patient's paternal grandmother diagnosed in her 21s. There is no history of ovarian cancer in the family.  GYNECOLOGIC HISTORY:  Patient's last menstrual  period was 10/22/2007.  menarche age 74, the patient is GX P0. She went through the change of life in 2011. She did not take hormone replacement.  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 Her husband [name] is a retired Visual merchandiser (he taught at [employer]).  [name] has 2 children from an earlier marriage, Rockey Situ who teaches in [place], and Mayara Paulson,  who works at the [employer] in in [place]. He has 1 grand son, aged 10. The patient and her husband are not church attender's    ADVANCED DIRECTIVES:  Not in place   HEALTH MAINTENANCE: Social History  Substance Use Topics  . Smoking status: Never Smoker  . Smokeless tobacco: Never Used  . Alcohol use 0.0 oz/week      Comment: social     Colonoscopy:  May 2016  PAP:  Bone density: 07/05/2013 normal, with a T score of 0.4  Lipid panel:  Allergies  Allergen Reactions  . Nsaids Other (See Comments)    H/o gastric bypass - avoid NSAIDs  . Peanuts [Peanut Oil] Itching    "Large amounts"  . Tape Hives and Rash    Adhesives in bandaids    Current Outpatient Prescriptions  Medication Sig Dispense Refill  . acetaminophen (TYLENOL) 325 MG tablet Take 650 mg by mouth 2 (two) times daily as needed for moderate pain or headache. Reported on 04/17/2015    . calcium citrate-vitamin D (CITRACAL+D) 315-200 MG-UNIT tablet Take 1 tablet by mouth 3 (three) times daily.    . diphenhydrAMINE (BENADRYL) 25 MG tablet Take 25 mg by mouth daily as needed for allergies or sleep. Reported on 03/06/2015    . Docusate Sodium (COLACE PO) Take by mouth. Takes a day    . glucose blood (ONETOUCH VERIO) test strip 1 each by Other route as needed for other. Reported on 03/06/2015    . losartan (COZAAR) 100 MG tablet Take 1 tablet by mouth daily. Reported on 03/06/2015    . Melatonin 5 MG TABS Take 5 mg by mouth at bedtime. Reported on 03/06/2015    . metoCLOPramide (REGLAN) 5 MG tablet TAKE 1 TABLET THREE TIMES DAILY BEFORE MEALS. 90 tablet 0  . mometasone (NASONEX) 50 MCG/ACT nasal spray Place 1-2 sprays into the nose daily as needed (for allergies.).     [name] NOVOLIN 70/30 (70-30) 100 UNIT/ML injection Inject 5-10 Units into the skin 2 (two) times daily with a meal. Reported on 03/06/2015    . Pediatric Multivitamins-Iron (FLINTSTONES PLUS IRON) chewable tablet Chew 1 tablet by mouth 2 (two) times daily.    . Polyethylene Glycol 3350 (MIRALAX PO) Take 1 packet by mouth as needed.    . Probiotic Product ([employer]S PROBIOTIC PO) Take 1 capsule by mouth daily. Reported on 03/06/2015    . Propylene Glycol (SYSTANE BALANCE OP) Apply 1-2 drops to eye 3 (three) times daily.    . Terbinafine HCl (LAMISIL PO) Take by mouth.    . Vortioxetine HBr  (TRINTELLIX) 20 MG TABS Take 20 mg by mouth at bedtime.      No current facility-administered medications for this visit.     OBJECTIVE:  Middle-aged white womanwho appears stated age 66:   03/12/16 0813  BP: (!) 144/56  Pulse: (!) 55  Resp: 18  Temp: 98 F (36.7 C)     Body mass index is 33.91 kg/m.    ECOG FS:1 - Symptomatic but completely  ambulatory Filed Weights   03/12/16 0813  Weight: 229 lb 9.6 oz (104.1 kg)   Sclerae unicteric, EOMs intact Oropharynx clear and moist No cervical or supraclavicular adenopathy Lungs no rales or rhonchi Heart regular rate and rhythm Abd soft, nontender, positive bowel sounds MSK no focal spinal tenderness, no upper extremity lymphedema Neuro: nonfocal, well oriented, appropriate affect Breasts: the right breast is status post mastectomy. There is no palpable evidence of recurrence. The right axilla is benign. The left breast is unremarkable.   LAB RESULTS:  CMP     Component Value Date/Time   NA 141 03/07/2016 0927   K 4.8 03/07/2016 0927   CL 105 12/08/2015 1028   CO2 25 03/07/2016 0927   GLUCOSE 130 03/07/2016 0927   BUN 28.3 (H) 03/07/2016 0927   CREATININE 1.2 (H) 03/07/2016 0927   CALCIUM 9.3 03/07/2016 0927   PROT 6.4 03/07/2016 0927   ALBUMIN 3.6 03/07/2016 0927   AST 23 03/07/2016 0927   ALT 24 03/07/2016 0927   ALKPHOS 73 03/07/2016 0927   BILITOT 0.65 03/07/2016 0927   GFRNONAA 51 (L) 10/10/2015 1007   GFRNONAA 55 (L) 02/18/2013 0827   GFRAA 59 (L) 10/10/2015 1007   GFRAA 63 02/18/2013 0827    INo results found for: SPEP, UPEP  Lab Results  Component Value Date   WBC 4.7 03/07/2016   NEUTROABS 3.0 03/07/2016   HGB 11.6 03/07/2016   HCT 33.1 (L) 03/07/2016   MCV 91.4 03/07/2016   PLT 203 03/07/2016      Chemistry      Component Value Date/Time   NA 141 03/07/2016 0927   K 4.8 03/07/2016 0927   CL 105 12/08/2015 1028   CO2 25 03/07/2016 0927   BUN 28.3 (H) 03/07/2016 0927   CREATININE 1.2 (H)  03/07/2016 0927      Component Value Date/Time   CALCIUM 9.3 03/07/2016 0927   ALKPHOS 73 03/07/2016 0927   AST 23 03/07/2016 0927   ALT 24 03/07/2016 0927   BILITOT 0.65 03/07/2016 0927       No results found for: LABCA2  No components found for: NGEXB284  No results for input(s): INR in the last 168 hours.  Urinalysis    Component Value Date/Time   COLORURINE YELLOW 07/21/2015 [place] 07/21/2015 2328   LABSPEC 1.020 07/21/2015 2328   PHURINE 6.0 07/21/2015 2328   GLUCOSEU NEGATIVE 07/21/2015 2328   HGBUR TRACE (A) 07/21/2015 [place] 07/21/2015 2328   BILIRUBINUR neg 06/29/2013 [place] 07/21/2015 2328   PROTEINUR 30 (A) 07/21/2015 2328   UROBILINOGEN negative 06/29/2013 1017   NITRITE NEGATIVE 07/21/2015 2328   LEUKOCYTESUR NEGATIVE 07/21/2015 2328    STUDIES: Nm Pet Image Restag (ps) Skull Base To Thigh  Result Date: 03/11/2016 CLINICAL DATA:  Subsequent treatment strategy for right upper outer quadrant breast carcinoma with metastatic disease to axillary lymph nodes. EXAM: NUCLEAR MEDICINE PET SKULL BASE TO THIGH TECHNIQUE: 11.2 mCi F-18 FDG was injected intravenously. Full-ring PET imaging was performed from the skull base to thigh after the radiotracer. CT data was obtained and used for attenuation correction and anatomic localization. FASTING BLOOD GLUCOSE:  Value: 131 mg/dl COMPARISON:  12/06/2015 FINDINGS: NECK No hypermetabolic lymph nodes in the neck. CHEST Mildly hypermetabolic subcutaneous nodule within upper left anterior chest wall corresponds to previous site of Port-A-Cath. Postop changes from right mastectomy and axillary lymph node dissection again demonstrated. 1.5 cm soft tissue nodule adjacent to  right axillary surgical clips on image 62/4 is stable in size, and has SUV max of 3.3 compared to 3.5 previously. A 1.3 cm right subpectoral lymph node on image 51/4 has increased in size from 9 mm previously.  This has SUV max of 11.8 compared to 5.5 previously. No hypermetabolic mediastinal or hilar nodes. No suspicious pulmonary nodules on the CT scan. Aortic and coronary artery atherosclerosis. ABDOMEN/PELVIS No abnormal hypermetabolic activity within the liver, pancreas, adrenal glands, or spleen. No hypermetabolic lymph nodes in the abdomen or pelvis. Stable postop changes from previous gastric bypass surgery. Tiny calcified gallstones again seen, without evidence cholecystitis. Aortic atherosclerosis. Colonic diverticulosis again demonstrated, without evidence of diverticulitis. Small calcified fibroid again seen in the right uterine fundus. Aortic atherosclerosis. SKELETON No focal hypermetabolic activity to suggest skeletal metastasis. IMPRESSION: Mild increase in hypermetabolic right subpectoral lymph node. Stable hypermetabolic soft tissue density adjacent to surgical clips from right axillary lymph node dissection, which may be due to postop changes or metastatic lymphadenopathy. No new or progressive disease identified. Electronically Signed   By: Earle Gell M.D.   On: 03/11/2016 09:53    ASSESSMENT: 66 y.o. [place] woman status post right breast upper outer quadrant and right axillary lymph node biopsy 07/19/2014 for a cT2  pN1, stage IIB  invasive ductal carcinoma, grade 2, estrogen and progesterone receptor negative, with an MIB-1 of 90%, and HER-2 amplified  (1) neoadjuvant chemotherapy started 08/08/2014, consisting of carboplatin, docetaxel, trastuzumab and pertuzumab given every 3 weeks 6, completed 11/21/2014  (a) breast MRI after initial 3 cycles shows a significant response  (b) breast MRI after 6 cycles shows a complete radiologic response   (2) trastuzumab continued through 10/02/2015 (to end of capecitabine therapy)  (a) echo 02/13/2015 shows an EF of 60-65%  (b)  Echo 05/09/2015 shows an ejection fraction of 60-65%  (c) echocardiogram 06/27/2015 shows an ejection fraction of  55-60%.  (3) right lumpectomy with sentinel lymph node biopsy on 12/27/14 showed a residual  ypT2 ypN1a, stage IIB invasive ductal carcinoma, grade 2, with repeat estrogen and progesterone receptors again negative  (4) status post right oncoplastic surgery (with left reduction mammoplasty) 01/06/2015 showing in the right breast multiple foci of intralymphatic carcinoma in random sections  (5)  Right modified radical mastectomy 02/15/2014 showed no residual invasive disease; there was DCIS but margins were negative; all 15 axillary lymph nodes were clear; ypTis ypN0  (a)  The patient has decided against reconstruction.  (6)  Postmastectomy radiation completed 05/18/2015 with capecitabine sensitization  (7) adjuvant capecitabine continued through 09/27/2015  (8) hepatic steatosis with increased fibrosis score (at risk for cirrhosis)  (9) thyroid biopsy (left lobe inferiorly) 2 shows benign tissue 06/28/2015  (10) LOCAL RECURRENCE:  (a) PET scan 12/06/2015 Notes a right retropectoral lymph node measuring 0.9 cm with an SUV max of 5.5  (b) repeat PET scan 03/11/2016 finds the right subpectoral lymph node to now measure 1.3 cm with an SUV max of 11.8.  (11) to start T-DM1 03/19/2016  PLAN: I spent approximately 30 minutes with [name] with most of that time spent discussing her complex problems. Unfortunately N does seem to have locally recurrent disease. The retropectoral lymph node is larger and hotter. The other lesion, next to a scar, is no larger and is a little dimmer so that is of much less concerned, although of course cancer there is not ruled out.  We could proceed to biopsy of this lesion although that might be technically somewhat difficult because  of its size and location. We also could consider stereotactic radiosurgery since it is the only site of disease at present that we can identify. Note of course that she has had radiation to this area previously.  However I would treat  this as a harbinger of systemic recurrence. It would make more sense I think to treat with T-DM 143 cycles and then restage. If we get a good response we willhave learned that this is indeed breast cancer and that it is still HER-2 amplified. In that case we would continue the TDM 1 to a total of 8 cycles and then switched to Herceptin and continue that for a minimum of a year or 2 .  On the other hand if we do not get a response we would proceed to biopsy, possibly excisional, of this lesion and then consideration of stereotactic radiosurgery.  Issabelle is agreeable to this plan. She is concerned about expense. They have recently switched insurance and they're having quite a bit of out of pocket expenses to deal with.  I am setting her up for a PICC line. They understand the possible complications of this and that it will need to be flushed at home. If we do get a response and plan on long-term anti-HER-2 treatment we will switch to a port. I'm also writing her for an echo. We are hoping to start the TM 1 a week from today.  They know to call for any problems that may develop before her next visit here.  Chauncey Cruel, MD   03/12/2016 8:42 AM

## 2016-03-13 NOTE — Progress Notes (Deleted)
Darlene [name]  Telephone:([phone]) [phone] Fax:(336) [phone]   ID: Darlene [name] DOB: [birth year]  MR#: [id number]  GPQ#:[id number]  Patient Care Team: Vernie Shanks, MD as PCP - General (Family Medicine) Bryson Ha Himmelrich, RD (Inactive) as Dietitian (Bariatrics) Stark Klein, MD as Consulting Physician (General Surgery) Chauncey Cruel, MD as Consulting Physician (Oncology) Arloa Koh, MD as Consulting Physician (Radiation Oncology) Mauro Kaufmann, RN as Registered Nurse Rockwell Germany, RN as Registered Nurse Jacelyn Pi, MD as Consulting Physician (Endocrinology) Kem Boroughs, FNP as Nurse Practitioner (Family Medicine) [name], MD as Consulting Physician (Plastic Surgery) Larey Dresser, MD as Consulting Physician (Cardiology) PCP: Anthoney Harada, MD OTHER MD:  CHIEF COMPLAINT:  HER-2 positive, estrogen receptor negative locally recurrent disease  CURRENT TREATMENT:  T-DM1  INTERVAL HISTORY:     REVIEW OF SYSTEMS:   BREAST CANCER HISTORY: From the original intake note:  "Darlene [name]" had screening mammography at her gynecologist suggestive of a possible abnormality in the right breast. However right diagnostic mammogram 04/12/2013 at [facility] was negative. More recently however, she noted a lump in her right breast and brought it to her gynecologist's attention. On 07/18/2014 the patient had bilateral diagnostic mammography with tomosynthesis at [facility]. The breast density was category B. In the right breast upper outer quadrant there was an area of focal asymmetry with indistinct margins.[name] Ultrasound was obtained and showed in addition to the mass in question, measuring 1.7 cm, an abnormal-appearing lymph node in the right axillary tail.  On 07/19/2014 the patient underwent biopsy of the right breast massin question as well as a right axillary lymph node. Both were positive for invasive ductal carcinoma, grade 2, estrogen and progesterone  receptor negative, with an MIB-1 of 90%, and HER-2 pending. Incidentally the lymph node biopsy showed a lymphocytic inflammatory component but no lymph node architecture.  Her subsequent history is as detailed below.  PAST MEDICAL HISTORY: Past Medical History:  Diagnosis Date  . Anemia    hx of  . Anxiety   . Arthritis    in fingers, shoulders  . Balance problem   . Breast cancer ([place])   . Breast cancer of upper-outer quadrant of right female breast ([place]) 07/22/2014  . Clumsiness   . Constipation   . Depression   . Diabetes mellitus without complication ([place])    09+ years  . Eating disorder    binge eating  . Epistaxis 08/26/2014  . Fatty liver   . HCAP (healthcare-associated pneumonia) 12/18/2014  . Headache   . Heart murmur    never had any problems  . Hyperlipidemia   . Hypertension   . Neuromuscular disorder ([place])    diabetic neuropathy  . Obesity   . Obstructive sleep apnea on CPAP    uses cpap nightly  . Ovarian cyst rupture    possible  . Pneumonia   . Pneumonia 11/2014  . Radiation 04/05/15-05/19/15   right chest wall 50.4 Gy, mastectomy scar 10 Gy  . Thyroid nodule 06/2014    PAST SURGICAL HISTORY: Past Surgical History:  Procedure Laterality Date  . BIOPSY THYROID Bilateral 06/2014   2 nodules - Benign   . BREAST LUMPECTOMY WITH RADIOACTIVE SEED AND SENTINEL LYMPH NODE BIOPSY Right 12/27/2014   Procedure: BREAST LUMPECTOMY WITH RADIOACTIVE SEED AND SENTINEL LYMPH NODE BIOPSY;  Surgeon: Stark Klein, MD;  Location: [facility];  Service: General;  Laterality: Right;  . BREAST REDUCTION SURGERY Bilateral 01/06/2015   Procedure: BILATERAL BREAST  REDUCTION, RIGHT ONCOPLASTIC RECONSTRUCTION),LEFT BREAST REDUCTION FOR SYMMETRY;  Surgeon: [name], MD;  Location: [facility];  Service: Plastics;  Laterality: Bilateral;  . BREATH TEK H PYLORI N/A 09/15/2012   Procedure: BREATH TEK H PYLORI;  Surgeon: Pedro Earls, MD;  Location: [facility];   Service: General;  Laterality: N/A;  . BUNIONECTOMY Left 12/2013  . CARPAL TUNNEL RELEASE Right   . CARPAL TUNNEL RELEASE Left   . DUPUYTREN CONTRACTURE RELEASE    . GASTRIC ROUX-EN-Y N/A 11/02/2012   Procedure: LAPAROSCOPIC ROUX-EN-Y GASTRIC;  Surgeon: Pedro Earls, MD;  Location: [facility];  Service: General;  Laterality: N/A;  . LAPAROSCOPIC APPENDECTOMY N/A 07/22/2015   Procedure: APPENDECTOMY LAPAROSCOPIC;  Surgeon: Michael Boston, MD;  Location: [facility];  Service: General;  Laterality: N/A;  . MASTECTOMY Right 02/15/2015   modified  . MODIFIED MASTECTOMY Right 02/15/2015   Procedure: RIGHT MODIFIED RADICAL MASTECTOMY;  Surgeon: Stark Klein, MD;  Location: [facility];  Service: General;  Laterality: Right;  . PORT-A-CATH REMOVAL Left 10/10/2015   Procedure: PORT REMOVAL;  Surgeon: Stark Klein, MD;  Location: [facility];  Service: General;  Laterality: Left;  . PORTACATH PLACEMENT Left 08/02/2014   Procedure: INSERTION PORT-A-CATH;  Surgeon: Stark Klein, MD;  Location: [facility];  Service: General;  Laterality: Left;  . ROTATOR CUFF REPAIR Right 2005  . SCAR REVISION Right 12/08/2015   Procedure: COMPLEX REPAIR RIGHT CHEST 10-15CM;  Surgeon: [name], MD;  Location: [facility];  Service: Plastics;  Laterality: Right;  . TOE SURGERY  1970s   bone spur  . TRIGGER FINGER RELEASE     x3    FAMILY HISTORY Family History  Problem Relation Age of Onset  . CVA Mother   . Diabetes Father   . Heart failure Father   . Hypertension Sister   . Diabetes Brother   . Breast cancer Paternal Grandmother   . Diabetes Paternal Uncle    the patient's father died from complications of diabetes at age 92. The patient's mother died at age 25 with a subarachnoid hemorrhage. The patient had one brother, one sister. The only breast cancer was the patient's paternal grandmother diagnosed in her 71s. There is no history of ovarian cancer in the family.  GYNECOLOGIC HISTORY:    Patient's last menstrual period was 10/22/2007.  menarche age 16, the patient is GX P0. She went through the change of life in 2011. She did not take hormone replacement.  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 Her husband [name] is a retired Visual merchandiser (he taught at [employer]).  [name] has 2 children from an earlier marriage, Rockey Situ who teaches in [place], and Alean Kromer,  who works at the [employer] in in [place]. He has 1 grand son, aged 11. The patient and her husband are not church attender's    ADVANCED DIRECTIVES:  Not in place   HEALTH MAINTENANCE: Social History  Substance Use Topics  . Smoking status: Never Smoker  . Smokeless tobacco: Never Used  . Alcohol use 0.0 oz/week     Comment: social     Colonoscopy:  May 2016  PAP:  Bone density: 07/05/2013 normal, with a T score of 0.4  Lipid panel:  Allergies  Allergen Reactions  . Nsaids Other (See Comments)    H/o gastric bypass - avoid NSAIDs  . Peanuts [Peanut Oil] Itching    "Large amounts"  .  Tape Hives and Rash    Adhesives in bandaids    Current Outpatient Prescriptions  Medication Sig Dispense Refill  . acetaminophen (TYLENOL) 325 MG tablet Take 650 mg by mouth 2 (two) times daily as needed for moderate pain or headache. Reported on 04/17/2015    . calcium citrate-vitamin D (CITRACAL+D) 315-200 MG-UNIT tablet Take 1 tablet by mouth 3 (three) times daily.    . diphenhydrAMINE (BENADRYL) 25 MG tablet Take 25 mg by mouth daily as needed for allergies or sleep. Reported on 03/06/2015    . Docusate Sodium (COLACE PO) Take by mouth. Takes a day    . glucose blood (ONETOUCH VERIO) test strip 1 each by Other route as needed for other. Reported on 03/06/2015    . losartan (COZAAR) 100 MG tablet Take 1 tablet by mouth daily. Reported on 03/06/2015    . Melatonin 5 MG TABS Take 5 mg by mouth at bedtime. Reported on 03/06/2015     . metoCLOPramide (REGLAN) 5 MG tablet TAKE 1 TABLET THREE TIMES DAILY BEFORE MEALS. 90 tablet 0  . mometasone (NASONEX) 50 MCG/ACT nasal spray Place 1-2 sprays into the nose daily as needed (for allergies.).     [name] NOVOLIN 70/30 (70-30) 100 UNIT/ML injection Inject 5-10 Units into the skin 2 (two) times daily with a meal. Reported on 03/06/2015    . Pediatric Multivitamins-Iron (FLINTSTONES PLUS IRON) chewable tablet Chew 1 tablet by mouth 2 (two) times daily.    . Polyethylene Glycol 3350 (MIRALAX PO) Take 1 packet by mouth as needed.    . Probiotic Product ([employer] PROBIOTIC PO) Take 1 capsule by mouth daily. Reported on 03/06/2015    . Propylene Glycol (SYSTANE BALANCE OP) Apply 1-2 drops to eye 3 (three) times daily.    . Terbinafine HCl (LAMISIL PO) Take by mouth.    . Vortioxetine HBr (TRINTELLIX) 20 MG TABS Take 20 mg by mouth at bedtime.      No current facility-administered medications for this visit.     OBJECTIVE: There were no vitals filed for this visit.   There is no height or weight on file to calculate BMI.    ECOG FS:1 - Symptomatic but completely ambulatory There were no vitals filed for this visit. GENERAL: Patient is a well appearing female in no acute distress HEENT:  Sclerae anicteric.  Oropharynx clear and moist. No ulcerations or evidence of oropharyngeal candidiasis. Neck is supple.  NODES:  No cervical, supraclavicular, or axillary lymphadenopathy palpated.  BREAST EXAM:  Deferred. LUNGS:  Clear to auscultation bilaterally.  No wheezes or rhonchi. HEART:  Regular rate and rhythm. No murmur appreciated. ABDOMEN:  Soft, nontender.  Positive, normoactive bowel sounds. No organomegaly palpated. MSK:  No focal spinal tenderness to palpation. Full range of motion bilaterally in the upper extremities. EXTREMITIES:  No peripheral edema.   SKIN:  Clear with no obvious rashes or skin changes. No nail dyscrasia. NEURO:  Nonfocal. Well oriented.  Appropriate affect.     LAB  RESULTS:  CMP     Component Value Date/Time   NA 141 03/07/2016 0927   K 4.8 03/07/2016 0927   CL 105 12/08/2015 1028   CO2 25 03/07/2016 0927   GLUCOSE 130 03/07/2016 0927   BUN 28.3 (H) 03/07/2016 0927   CREATININE 1.2 (H) 03/07/2016 0927   CALCIUM 9.3 03/07/2016 0927   PROT 6.4 03/07/2016 0927   ALBUMIN 3.6 03/07/2016 0927   AST 23 03/07/2016 0927   ALT 24 03/07/2016 0927  ALKPHOS 73 03/07/2016 0927   BILITOT 0.65 03/07/2016 0927   GFRNONAA 51 (L) 10/10/2015 1007   GFRNONAA 55 (L) 02/18/2013 0827   GFRAA 59 (L) 10/10/2015 1007   GFRAA 63 02/18/2013 0827    INo results found for: SPEP, UPEP  Lab Results  Component Value Date   WBC 4.7 03/07/2016   NEUTROABS 3.0 03/07/2016   HGB 11.6 03/07/2016   HCT 33.1 (L) 03/07/2016   MCV 91.4 03/07/2016   PLT 203 03/07/2016      Chemistry      Component Value Date/Time   NA 141 03/07/2016 0927   K 4.8 03/07/2016 0927   CL 105 12/08/2015 1028   CO2 25 03/07/2016 0927   BUN 28.3 (H) 03/07/2016 0927   CREATININE 1.2 (H) 03/07/2016 0927      Component Value Date/Time   CALCIUM 9.3 03/07/2016 0927   ALKPHOS 73 03/07/2016 0927   AST 23 03/07/2016 0927   ALT 24 03/07/2016 0927   BILITOT 0.65 03/07/2016 0927       No results found for: LABCA2  No components found for: [id number]  No results for input(s): INR in the last 168 hours.  Urinalysis    Component Value Date/Time   COLORURINE YELLOW 07/21/2015 [place] 07/21/2015 2328   LABSPEC 1.020 07/21/2015 2328   PHURINE 6.0 07/21/2015 2328   GLUCOSEU NEGATIVE 07/21/2015 2328   HGBUR TRACE (A) 07/21/2015 [place] 07/21/2015 2328   BILIRUBINUR neg 06/29/2013 [place] 07/21/2015 2328   PROTEINUR 30 (A) 07/21/2015 2328   UROBILINOGEN negative 06/29/2013 1017   NITRITE NEGATIVE 07/21/2015 2328   LEUKOCYTESUR NEGATIVE 07/21/2015 2328    STUDIES: Nm Pet Image Restag (ps) Skull Base To Thigh  Result Date:  03/11/2016 CLINICAL DATA:  Subsequent treatment strategy for right upper outer quadrant breast carcinoma with metastatic disease to axillary lymph nodes. EXAM: NUCLEAR MEDICINE PET SKULL BASE TO THIGH TECHNIQUE: 11.2 mCi F-18 FDG was injected intravenously. Full-ring PET imaging was performed from the skull base to thigh after the radiotracer. CT data was obtained and used for attenuation correction and anatomic localization. FASTING BLOOD GLUCOSE:  Value: 131 mg/dl COMPARISON:  12/06/2015 FINDINGS: NECK No hypermetabolic lymph nodes in the neck. CHEST Mildly hypermetabolic subcutaneous nodule within upper left anterior chest wall corresponds to previous site of Port-A-Cath. Postop changes from right mastectomy and axillary lymph node dissection again demonstrated. 1.5 cm soft tissue nodule adjacent to right axillary surgical clips on image 62/4 is stable in size, and has SUV max of 3.3 compared to 3.5 previously. A 1.3 cm right subpectoral lymph node on image 51/4 has increased in size from 9 mm previously. This has SUV max of 11.8 compared to 5.5 previously. No hypermetabolic mediastinal or hilar nodes. No suspicious pulmonary nodules on the CT scan. Aortic and coronary artery atherosclerosis. ABDOMEN/PELVIS No abnormal hypermetabolic activity within the liver, pancreas, adrenal glands, or spleen. No hypermetabolic lymph nodes in the abdomen or pelvis. Stable postop changes from previous gastric bypass surgery. Tiny calcified gallstones again seen, without evidence cholecystitis. Aortic atherosclerosis. Colonic diverticulosis again demonstrated, without evidence of diverticulitis. Small calcified fibroid again seen in the right uterine fundus. Aortic atherosclerosis. SKELETON No focal hypermetabolic activity to suggest skeletal metastasis. IMPRESSION: Mild increase in hypermetabolic right subpectoral lymph node. Stable hypermetabolic soft tissue density adjacent to surgical clips from right axillary lymph node  dissection, which may be due to postop changes or metastatic lymphadenopathy. No new or  progressive disease identified. Electronically Signed   By: Earle Gell M.D.   On: 03/11/2016 09:53    ASSESSMENT: 66 y.o.  woman status post right breast upper outer quadrant and right axillary lymph node biopsy 07/19/2014 for a cT2  pN1, stage IIB  invasive ductal carcinoma, grade 2, estrogen and progesterone receptor negative, with an MIB-1 of 90%, and HER-2 amplified  (1) neoadjuvant chemotherapy started 08/08/2014, consisting of carboplatin, docetaxel, trastuzumab and pertuzumab given every 3 weeks 6, completed 11/21/2014  (a) breast MRI after initial 3 cycles shows a significant response  (b) breast MRI after 6 cycles shows a complete radiologic response   (2) trastuzumab continued through 10/02/2015 (to end of capecitabine therapy)  (a) echo 02/13/2015 shows an EF of 60-65%  (b)  Echo 05/09/2015 shows an ejection fraction of 60-65%  (c) echocardiogram 06/27/2015 shows an ejection fraction of 55-60%.  (3) right lumpectomy with sentinel lymph node biopsy on 12/27/14 showed a residual  ypT2 ypN1a, stage IIB invasive ductal carcinoma, grade 2, with repeat estrogen and progesterone receptors again negative  (4) status post right oncoplastic surgery (with left reduction mammoplasty) 01/06/2015 showing in the right breast multiple foci of intralymphatic carcinoma in random sections  (5)  Right modified radical mastectomy 02/15/2014 showed no residual invasive disease; there was DCIS but margins were negative; all 15 axillary lymph nodes were clear; ypTis ypN0  (a)  The patient has decided against reconstruction.  (6)  Postmastectomy radiation completed 05/18/2015 with capecitabine sensitization  (7) adjuvant capecitabine continued through 09/27/2015  (8) hepatic steatosis with increased fibrosis score (at risk for cirrhosis)  (9) thyroid biopsy (left lobe inferiorly) 2 shows benign tissue  06/28/2015  (10) LOCAL RECURRENCE:  (a) PET scan 12/06/2015 Notes a right retropectoral lymph node measuring 0.9 cm with an SUV max of 5.5  (b) repeat PET scan 03/11/2016 finds the right subpectoral lymph node to now measure 1.3 cm with an SUV max of 11.8.  (11) to start T-DM1 03/19/2016  PLAN:   Darlene Massed, NP   03/13/2016 3:25 PM

## 2016-03-15 ENCOUNTER — Ambulatory Visit (HOSPITAL_COMMUNITY)
Admission: RE | Admit: 2016-03-15 | Discharge: 2016-03-15 | Disposition: A | Discharge status: Home / Self Care | Payer: Commercial Managed Care - PPO | Source: Ambulatory Visit | Attending: Oncology | Admitting: Oncology

## 2016-03-15 DIAGNOSIS — C50911 Malignant neoplasm of unspecified site of right female breast: Secondary | ICD-10-CM

## 2016-03-15 DIAGNOSIS — C773 Secondary and unspecified malignant neoplasm of axilla and upper limb lymph nodes: Secondary | ICD-10-CM | POA: Insufficient documentation

## 2016-03-15 DIAGNOSIS — Z171 Estrogen receptor negative status [ER-]: Secondary | ICD-10-CM | POA: Insufficient documentation

## 2016-03-15 DIAGNOSIS — I5189 Other ill-defined heart diseases: Secondary | ICD-10-CM | POA: Diagnosis not present

## 2016-03-15 DIAGNOSIS — C50411 Malignant neoplasm of upper-outer quadrant of right female breast: Secondary | ICD-10-CM | POA: Insufficient documentation

## 2016-03-15 NOTE — Progress Notes (Signed)
  Echocardiogram 2D Echocardiogram has been performed.  Darlene [name] 03/15/2016, 10:22 AM

## 2016-03-18 ENCOUNTER — Other Ambulatory Visit: Payer: Self-pay | Admitting: Adult Health

## 2016-03-18 ENCOUNTER — Encounter (HOSPITAL_COMMUNITY): Payer: Self-pay | Admitting: Diagnostic Radiology

## 2016-03-18 ENCOUNTER — Other Ambulatory Visit: Payer: Self-pay | Admitting: Oncology

## 2016-03-18 ENCOUNTER — Ambulatory Visit (HOSPITAL_COMMUNITY)
Admission: RE | Admit: 2016-03-18 | Discharge: 2016-03-18 | Disposition: A | Discharge status: Home / Self Care | Payer: Commercial Managed Care - PPO | Source: Ambulatory Visit | Attending: Oncology | Admitting: Oncology

## 2016-03-18 DIAGNOSIS — C50911 Malignant neoplasm of unspecified site of right female breast: Secondary | ICD-10-CM | POA: Diagnosis present

## 2016-03-18 DIAGNOSIS — C50411 Malignant neoplasm of upper-outer quadrant of right female breast: Secondary | ICD-10-CM

## 2016-03-18 DIAGNOSIS — Z171 Estrogen receptor negative status [ER-]: Secondary | ICD-10-CM

## 2016-03-18 DIAGNOSIS — C773 Secondary and unspecified malignant neoplasm of axilla and upper limb lymph nodes: Secondary | ICD-10-CM

## 2016-03-18 HISTORY — PX: IR GENERIC HISTORICAL: IMG1180011

## 2016-03-18 MED ORDER — HEPARIN SOD (PORK) LOCK FLUSH 100 UNIT/ML IV SOLN
INTRAVENOUS | Status: AC
  Filled 2016-03-18: qty 5

## 2016-03-18 MED ORDER — LIDOCAINE HCL 1 % IJ SOLN
INTRAMUSCULAR | Status: AC
  Filled 2016-03-18: qty 20

## 2016-03-18 NOTE — Procedures (Signed)
Placement of left arm PICC.  Catheter in left basilic vein and tip at SVC/RA junction.  Ready to use.  No immediate complication.

## 2016-03-19 ENCOUNTER — Ambulatory Visit (HOSPITAL_BASED_OUTPATIENT_CLINIC_OR_DEPARTMENT_OTHER): Payer: Commercial Managed Care - PPO

## 2016-03-19 ENCOUNTER — Ambulatory Visit: Payer: Commercial Managed Care - PPO | Admitting: Adult Health

## 2016-03-19 ENCOUNTER — Other Ambulatory Visit (HOSPITAL_BASED_OUTPATIENT_CLINIC_OR_DEPARTMENT_OTHER): Payer: Commercial Managed Care - PPO

## 2016-03-19 ENCOUNTER — Other Ambulatory Visit: Payer: Commercial Managed Care - PPO

## 2016-03-19 ENCOUNTER — Ambulatory Visit: Payer: Commercial Managed Care - PPO

## 2016-03-19 DIAGNOSIS — C773 Secondary and unspecified malignant neoplasm of axilla and upper limb lymph nodes: Principal | ICD-10-CM

## 2016-03-19 DIAGNOSIS — C50411 Malignant neoplasm of upper-outer quadrant of right female breast: Secondary | ICD-10-CM

## 2016-03-19 DIAGNOSIS — Z452 Encounter for adjustment and management of vascular access device: Secondary | ICD-10-CM

## 2016-03-19 DIAGNOSIS — C50911 Malignant neoplasm of unspecified site of right female breast: Secondary | ICD-10-CM

## 2016-03-19 LAB — CBC WITH DIFFERENTIAL/PLATELET
BASO%: 0.3 % (ref 0.0–2.0)
Basophils Absolute: 0 10*3/uL (ref 0.0–0.1)
EOS%: 2.1 % (ref 0.0–7.0)
Eosinophils Absolute: 0.1 10*3/uL (ref 0.0–0.5)
HCT: 33 % — ABNORMAL LOW (ref 34.8–46.6)
HGB: 11.3 g/dL — ABNORMAL LOW (ref 11.6–15.9)
LYMPH%: 21.6 % (ref 14.0–49.7)
MCH: 31.4 pg (ref 25.1–34.0)
MCHC: 34.3 g/dL (ref 31.5–36.0)
MCV: 91.6 fL (ref 79.5–101.0)
MONO#: 0.4 10*3/uL (ref 0.1–0.9)
MONO%: 7.8 % (ref 0.0–14.0)
NEUT#: 3.3 10*3/uL (ref 1.5–6.5)
NEUT%: 68.2 % (ref 38.4–76.8)
Platelets: 183 10*3/uL (ref 145–400)
RBC: 3.6 10*6/uL — ABNORMAL LOW (ref 3.70–5.45)
RDW: 13.8 % (ref 11.2–14.5)
WBC: 4.9 10*3/uL (ref 3.9–10.3)
lymph#: 1.1 10*3/uL (ref 0.9–3.3)

## 2016-03-19 LAB — COMPREHENSIVE METABOLIC PANEL
ALT: 24 U/L (ref 0–55)
AST: 18 U/L (ref 5–34)
Albumin: 3.7 g/dL (ref 3.5–5.0)
Alkaline Phosphatase: 79 U/L (ref 40–150)
Anion Gap: 7 mEq/L (ref 3–11)
BUN: 28.7 mg/dL — ABNORMAL HIGH (ref 7.0–26.0)
CO2: 25 mEq/L (ref 22–29)
Calcium: 9.1 mg/dL (ref 8.4–10.4)
Chloride: 109 mEq/L (ref 98–109)
Creatinine: 1.3 mg/dL — ABNORMAL HIGH (ref 0.6–1.1)
EGFR: 43 mL/min/{1.73_m2} — ABNORMAL LOW (ref >90)
Glucose: 213 mg/dl — ABNORMAL HIGH (ref 70–140)
Potassium: 4.3 mEq/L (ref 3.5–5.1)
Sodium: 141 mEq/L (ref 136–145)
Total Bilirubin: 0.5 mg/dL (ref 0.20–1.20)
Total Protein: 6.3 g/dL — ABNORMAL LOW (ref 6.4–8.3)

## 2016-03-19 MED ORDER — HEPARIN SOD (PORK) LOCK FLUSH 100 UNIT/ML IV SOLN
250.0000 [IU] | INTRAVENOUS | Status: AC | PRN
  Administered 2016-03-19: 250 [IU]
  Filled 2016-03-19: qty 5

## 2016-03-19 MED ORDER — SODIUM CHLORIDE 0.9% FLUSH
10.0000 mL | INTRAVENOUS | Status: AC | PRN
  Administered 2016-03-19: 10 mL
  Filled 2016-03-19: qty 10

## 2016-03-19 NOTE — Progress Notes (Signed)
[place]  [phone] [phone]   ID: ANNAGRACE CARR DOB: 28-May-1950  MR#: [id number]  ONG#:[id number]  Patient Care Team: Vernie Shanks, MD as PCP - General (Family Medicine) Bryson Ha Himmelrich, RD (Inactive) as Dietitian (Bariatrics) Stark Klein, MD as Consulting Physician (General Surgery) Chauncey Cruel, MD as Consulting Physician (Oncology) Arloa Koh, MD as Consulting Physician (Radiation Oncology) Mauro Kaufmann, RN as Registered Nurse Rockwell Germany, RN as Registered Nurse Jacelyn Pi, MD as Consulting Physician (Endocrinology) Kem Boroughs, FNP as Nurse Practitioner (Family Medicine) [name], MD as Consulting Physician (Plastic Surgery) Larey Dresser, MD as Consulting Physician (Cardiology) PCP: Anthoney Harada, MD OTHER MD:  CHIEF COMPLAINT:  HER-2 positive, estrogen receptor negative locally recurrent disease  CURRENT TREATMENT:  T-DM1  INTERVAL HISTORY:  Darlene [name] is here today for evaluation prior to receiving treatment with Kadcyla (TDM-1).  She is feeling well today.  She is accompanied today by her husband dennis.  They are frustrated with the fact that her cancer recurred so quickly.  She is taking an antidepressant and denies hopelessness, increased tearfulness, anhedonia.  She continues to work.  Her last echo was on 03/15/2016 and demonstrated lvef 55-60%, she has an upcoming appointment with Dr. Aundra Dubin on 04/04/2016.     REVIEW OF SYSTEMS: Darlene [name] got her picc line and tolerated that procedure well.  She denies any fevers, chills, chest pain, palpitations, cough, shortness of breath, nausea, vomiting, weight loss or any further concerns.  A detailed ROS was non contributory.  BREAST CANCER HISTORY: From the original intake note:  "Darlene [name]" had screening mammography at her gynecologist suggestive of a possible abnormality in the right breast. However right diagnostic mammogram 04/12/2013 at [facility] was  negative. More recently however, she noted a lump in her right breast and brought it to her gynecologist's attention. On 07/18/2014 the patient had bilateral diagnostic mammography with tomosynthesis at [facility]. The breast density was category B. In the right breast upper outer quadrant there was an area of focal asymmetry with indistinct margins.[name] Ultrasound was obtained and showed in addition to the mass in question, measuring 1.7 cm, an abnormal-appearing lymph node in the right axillary tail.  On 07/19/2014 the patient underwent biopsy of the right breast massin question as well as a right axillary lymph node. Both were positive for invasive ductal carcinoma, grade 2, estrogen and progesterone receptor negative, with an MIB-1 of 90%, and HER-2 pending. Incidentally the lymph node biopsy showed a lymphocytic inflammatory component but no lymph node architecture.  Her subsequent history is as detailed below.  PAST MEDICAL HISTORY: Past Medical History:  Diagnosis Date  . Anemia    hx of  . Anxiety   . Arthritis    in fingers, shoulders  . Balance problem   . Breast cancer ([place])   . Breast cancer of upper-outer quadrant of right female breast ([place]) 07/22/2014  . Clumsiness   . Constipation   . Depression   . Diabetes mellitus without complication ([place])    44+ years  . Eating disorder    binge eating  . Epistaxis 08/26/2014  . Fatty liver   . HCAP (healthcare-associated pneumonia) 12/18/2014  . Headache   . Heart murmur    never had any problems  . Hyperlipidemia   . Hypertension   . Neuromuscular disorder ([place])    diabetic neuropathy  . Obesity   . Obstructive sleep apnea on CPAP    uses cpap nightly  .  Ovarian cyst rupture    possible  . Pneumonia   . Pneumonia 11/2014  . Radiation 04/05/15-05/19/15   right chest wall 50.4 Gy, mastectomy scar 10 Gy  . Thyroid nodule 06/2014    PAST SURGICAL HISTORY: Past Surgical History:  Procedure Laterality Date  . BIOPSY THYROID  Bilateral 06/2014   2 nodules - Benign   . BREAST LUMPECTOMY WITH RADIOACTIVE SEED AND SENTINEL LYMPH NODE BIOPSY Right 12/27/2014   Procedure: BREAST LUMPECTOMY WITH RADIOACTIVE SEED AND SENTINEL LYMPH NODE BIOPSY;  Surgeon: Stark Klein, MD;  Location: [place];  Service: General;  Laterality: Right;  . BREAST REDUCTION SURGERY Bilateral 01/06/2015   Procedure: BILATERAL BREAST REDUCTION, RIGHT ONCOPLASTIC RECONSTRUCTION),LEFT BREAST REDUCTION FOR SYMMETRY;  Surgeon: [name], MD;  Location: [place];  Service: Plastics;  Laterality: Bilateral;  . BREATH TEK H PYLORI N/A 09/15/2012   Procedure: BREATH TEK H PYLORI;  Surgeon: Pedro Earls, MD;  Location: [facility];  Service: General;  Laterality: N/A;  . BUNIONECTOMY Left 12/2013  . CARPAL TUNNEL RELEASE Right   . CARPAL TUNNEL RELEASE Left   . DUPUYTREN CONTRACTURE RELEASE    . GASTRIC ROUX-EN-Y N/A 11/02/2012   Procedure: LAPAROSCOPIC ROUX-EN-Y GASTRIC;  Surgeon: Pedro Earls, MD;  Location: [facility];  Service: General;  Laterality: N/A;  . IR GENERIC HISTORICAL  03/18/2016   IR US GUIDE VASC ACCESS LEFT 03/18/2016 Markus Daft, MD [facility]  . IR GENERIC HISTORICAL  03/18/2016   IR FLUORO GUIDE CV LINE LEFT 03/18/2016 Markus Daft, MD [facility]  . LAPAROSCOPIC APPENDECTOMY N/A 07/22/2015   Procedure: APPENDECTOMY LAPAROSCOPIC;  Surgeon: Michael Boston, MD;  Location: [facility];  Service: General;  Laterality: N/A;  . MASTECTOMY Right 02/15/2015   modified  . MODIFIED MASTECTOMY Right 02/15/2015   Procedure: RIGHT MODIFIED RADICAL MASTECTOMY;  Surgeon: Stark Klein, MD;  Location: [place];  Service: General;  Laterality: Right;  . PORT-A-CATH REMOVAL Left 10/10/2015   Procedure: PORT REMOVAL;  Surgeon: Stark Klein, MD;  Location: [place];  Service: General;  Laterality: Left;  . PORTACATH PLACEMENT Left 08/02/2014   Procedure: INSERTION PORT-A-CATH;  Surgeon: Stark Klein, MD;  Location: [place];  Service:  General;  Laterality: Left;  . ROTATOR CUFF REPAIR Right 2005  . SCAR REVISION Right 12/08/2015   Procedure: COMPLEX REPAIR RIGHT CHEST 10-15CM;  Surgeon: [name], MD;  Location: [place];  Service: Plastics;  Laterality: Right;  . TOE SURGERY  1970s   bone spur  . TRIGGER FINGER RELEASE     x3    FAMILY HISTORY Family History  Problem Relation Age of Onset  . CVA Mother   . Diabetes Father   . Heart failure Father   . Hypertension Sister   . Diabetes Brother   . Breast cancer Paternal Grandmother   . Diabetes Paternal Uncle    the patient's father died from complications of diabetes at age 10. The patient's mother died at age 26 with a subarachnoid hemorrhage. The patient had one brother, one sister. The only breast cancer was the patient's paternal grandmother diagnosed in her 24s. There is no history of ovarian cancer in the family.  GYNECOLOGIC HISTORY:  Patient's last menstrual period was 10/22/2007.  menarche age 58, the patient is GX P0. She went through the change of life in 2011. She did not take hormone replacement.  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 Her husband [name] is a retired Visual merchandiser (he taught at [employer]).  [name] has 2 children from an earlier marriage, Rockey Situ who teaches in [place], and Katoria Yetman,  who works at the [employer] in in [place]. He has 1 grand son, aged 74. The patient and her husband are not church attender's    ADVANCED DIRECTIVES:  Not in place   HEALTH MAINTENANCE: Social History  Substance Use Topics  . Smoking status: Never Smoker  . Smokeless tobacco: Never Used  . Alcohol use 0.0 oz/week     Comment: social     Colonoscopy:  May 2016  PAP:  Bone density: 07/05/2013 normal, with a T score of 0.4  Lipid panel:  Allergies  Allergen Reactions  . Nsaids Other (See Comments)    H/o gastric bypass -  avoid NSAIDs  . Peanuts [Peanut Oil] Itching    "Large amounts"  . Tape Hives and Rash    Adhesives in bandaids    Current Outpatient Prescriptions  Medication Sig Dispense Refill  . acetaminophen (TYLENOL) 325 MG tablet Take 650 mg by mouth 2 (two) times daily as needed for moderate pain or headache. Reported on 04/17/2015    . calcium citrate-vitamin D (CITRACAL+D) 315-200 MG-UNIT tablet Take 1 tablet by mouth 3 (three) times daily.    . diphenhydrAMINE (BENADRYL) 25 MG tablet Take 25 mg by mouth daily as needed for allergies or sleep. Reported on 03/06/2015    . Docusate Sodium (COLACE PO) Take by mouth. Takes a day    . glucose blood (ONETOUCH VERIO) test strip 1 each by Other route as needed for other. Reported on 03/06/2015    . losartan (COZAAR) 100 MG tablet Take 1 tablet by mouth daily. Reported on 03/06/2015    . Melatonin 5 MG TABS Take 5 mg by mouth at bedtime. Reported on 03/06/2015    . mometasone (NASONEX) 50 MCG/ACT nasal spray Place 1-2 sprays into the nose daily as needed (for allergies.).     [name] NOVOLIN 70/30 (70-30) 100 UNIT/ML injection Inject 5-10 Units into the skin 2 (two) times daily with a meal. Reported on 03/06/2015    . Pediatric Multivitamins-Iron (FLINTSTONES PLUS IRON) chewable tablet Chew 1 tablet by mouth 2 (two) times daily.    . Polyethylene Glycol 3350 (MIRALAX PO) Take 1 packet by mouth as needed.    . Probiotic Product ([employer] PROBIOTIC PO) Take 1 capsule by mouth daily. Reported on 03/06/2015    . Propylene Glycol (SYSTANE BALANCE OP) Apply 1-2 drops to eye 3 (three) times daily.    . Terbinafine HCl (LAMISIL PO) Take by mouth.    . Vortioxetine HBr (TRINTELLIX) 20 MG TABS Take 20 mg by mouth at bedtime.      No current facility-administered medications for this visit.    Facility-Administered Medications Ordered in Other Visits  Medication Dose Route Frequency Provider Last Rate Last Dose  . ado-trastuzumab emtansine (KADCYLA) 360 mg in sodium chloride 0.9 %  250 mL chemo infusion  3.4 mg/kg (Treatment Plan Recorded) Intravenous Once Chauncey Cruel, MD 179 mL/hr at 03/21/16 1236 360 mg at 03/21/16 1236  . heparin lock flush 100 unit/mL  250 Units Intracatheter Once PRN Chauncey Cruel, MD      . sodium chloride flush (NS) 0.9 % injection 10 mL  10 mL Intracatheter PRN Chauncey Cruel, MD        OBJECTIVE:  Vitals:   03/21/16 0945  BP: [name])  155/56  Pulse: 71  Resp: 18  Temp: 97.8 F (36.6 C)     Body mass index is 34.51 kg/m.    ECOG FS:1 - Symptomatic but completely ambulatory Filed Weights   03/21/16 0945  Weight: 233 lb 11.2 oz (106 kg)  GENERAL: Patient is a well appearing female in no acute distress HEENT:  Sclerae anicteric.  Oropharynx clear and moist. No ulcerations or evidence of oropharyngeal candidiasis. Neck is supple. PERRL. NODES:  No cervical, supraclavicular, infraclavicular or axillary lymphadenopathy palpated.  BREAST EXAM:  Deferred. LUNGS:  Clear to auscultation bilaterally.  No wheezes or rhonchi. HEART:  Regular rate and rhythm. No murmur appreciated. ABDOMEN:  Soft, nontender.  Positive, normoactive bowel sounds. No organomegaly palpated. MSK:  No focal spinal tenderness to palpation. Full range of motion bilaterally in the upper extremities. EXTREMITIES:  No peripheral edema.   SKIN:  Clear with no obvious rashes or skin changes. No nail dyscrasia. NEURO:  Nonfocal. Well oriented.  Appropriate affect.      LAB RESULTS:  CMP     Component Value Date/Time   NA 141 03/19/2016 0815   K 4.3 03/19/2016 0815   CL 105 12/08/2015 1028   CO2 25 03/19/2016 0815   GLUCOSE 213 (H) 03/19/2016 0815   BUN 28.7 (H) 03/19/2016 0815   CREATININE 1.3 (H) 03/19/2016 0815   CALCIUM 9.1 03/19/2016 0815   PROT 6.3 (L) 03/19/2016 0815   ALBUMIN 3.7 03/19/2016 0815   AST 18 03/19/2016 0815   ALT 24 03/19/2016 0815   ALKPHOS 79 03/19/2016 0815   BILITOT 0.50 03/19/2016 0815   GFRNONAA 51 (L) 10/10/2015 1007    GFRNONAA 55 (L) 02/18/2013 0827   GFRAA 59 (L) 10/10/2015 1007   GFRAA 63 02/18/2013 0827    INo results found for: SPEP, UPEP  Lab Results  Component Value Date   WBC 4.9 03/19/2016   NEUTROABS 3.3 03/19/2016   HGB 11.3 (L) 03/19/2016   HCT 33.0 (L) 03/19/2016   MCV 91.6 03/19/2016   PLT 183 03/19/2016      Chemistry      Component Value Date/Time   NA 141 03/19/2016 0815   K 4.3 03/19/2016 0815   CL 105 12/08/2015 1028   CO2 25 03/19/2016 0815   BUN 28.7 (H) 03/19/2016 0815   CREATININE 1.3 (H) 03/19/2016 0815      Component Value Date/Time   CALCIUM 9.1 03/19/2016 0815   ALKPHOS 79 03/19/2016 0815   AST 18 03/19/2016 0815   ALT 24 03/19/2016 0815   BILITOT 0.50 03/19/2016 0815       No results found for: LABCA2  No components found for: LABCA125  No results for input(s): INR in the last 168 hours.  Urinalysis    Component Value Date/Time   COLORURINE YELLOW 07/21/2015 [place] 07/21/2015 2328   LABSPEC 1.020 07/21/2015 2328   PHURINE 6.0 07/21/2015 2328   GLUCOSEU NEGATIVE 07/21/2015 2328   HGBUR TRACE (A) 07/21/2015 [place] 07/21/2015 2328   BILIRUBINUR neg 06/29/2013 [place] 07/21/2015 2328   PROTEINUR 30 (A) 07/21/2015 2328   UROBILINOGEN negative 06/29/2013 1017   NITRITE NEGATIVE 07/21/2015 2328   LEUKOCYTESUR NEGATIVE 07/21/2015 2328    STUDIES: Nm Pet Image Restag (ps) Skull Base To Thigh  Result Date: 03/11/2016 CLINICAL DATA:  Subsequent treatment strategy for right upper outer quadrant breast carcinoma with metastatic disease to axillary lymph nodes. EXAM: NUCLEAR MEDICINE PET SKULL BASE  TO THIGH TECHNIQUE: 11.2 mCi F-18 FDG was injected intravenously. Full-ring PET imaging was performed from the skull base to thigh after the radiotracer. CT data was obtained and used for attenuation correction and anatomic localization. FASTING BLOOD GLUCOSE:  Value: 131 mg/dl COMPARISON:  12/06/2015  FINDINGS: NECK No hypermetabolic lymph nodes in the neck. CHEST Mildly hypermetabolic subcutaneous nodule within upper left anterior chest wall corresponds to previous site of Port-A-Cath. Postop changes from right mastectomy and axillary lymph node dissection again demonstrated. 1.5 cm soft tissue nodule adjacent to right axillary surgical clips on image 62/4 is stable in size, and has SUV max of 3.3 compared to 3.5 previously. A 1.3 cm right subpectoral lymph node on image 51/4 has increased in size from 9 mm previously. This has SUV max of 11.8 compared to 5.5 previously. No hypermetabolic mediastinal or hilar nodes. No suspicious pulmonary nodules on the CT scan. Aortic and coronary artery atherosclerosis. ABDOMEN/PELVIS No abnormal hypermetabolic activity within the liver, pancreas, adrenal glands, or spleen. No hypermetabolic lymph nodes in the abdomen or pelvis. Stable postop changes from previous gastric bypass surgery. Tiny calcified gallstones again seen, without evidence cholecystitis. Aortic atherosclerosis. Colonic diverticulosis again demonstrated, without evidence of diverticulitis. Small calcified fibroid again seen in the right uterine fundus. Aortic atherosclerosis. SKELETON No focal hypermetabolic activity to suggest skeletal metastasis. IMPRESSION: Mild increase in hypermetabolic right subpectoral lymph node. Stable hypermetabolic soft tissue density adjacent to surgical clips from right axillary lymph node dissection, which may be due to postop changes or metastatic lymphadenopathy. No new or progressive disease identified. Electronically Signed   By: Earle Gell M.D.   On: 03/11/2016 09:53   Ir Fluoro Guide Cv Line Left  Result Date: 03/18/2016 INDICATION: 66 year old with right breast cancer. PICC line needed for chemotherapy. EXAM: PICC LINE PLACEMENT WITH ULTRASOUND AND FLUOROSCOPIC GUIDANCE MEDICATIONS: None ANESTHESIA/SEDATION: None FLUOROSCOPY TIME:  Fluoroscopy Time: 18 seconds, 5.5  mGy COMPLICATIONS: None immediate. PROCEDURE: The patient was advised of the possible risks and complications and agreed to undergo the procedure. The patient was then brought to the angiographic suite for the procedure. The left arm was prepped with chlorhexidine, draped in the usual sterile fashion using maximum barrier technique (cap and mask, sterile gown, sterile gloves, large sterile sheet, hand hygiene and cutaneous antiseptic). Local anesthesia was attained by infiltration with 1% lidocaine. Ultrasound demonstrated patency of the left basilic vein, and this was documented with an image. Under real-time ultrasound guidance, this vein was accessed with a 21 gauge micropuncture needle and image documentation was performed. The needle was exchanged over a guidewire for a peel-away sheath through which a 51 cm 5 [place] single lumen power injectable PICC was advanced, and positioned with its tip at the lower SVC/right atrial junction. Fluoroscopy during the procedure and fluoro spot radiograph confirms appropriate catheter position. The catheter was flushed, secured to the skin, and covered with a sterile dressing. IMPRESSION: Successful placement of a left arm PICC with sonographic and fluoroscopic guidance. The catheter is ready for use. Electronically Signed   By: Markus Daft M.D.   On: 03/18/2016 09:14   Ir US Guide Vasc Access Left  Result Date: 03/18/2016 INDICATION: 66 year old with right breast cancer. PICC line needed for chemotherapy. EXAM: PICC LINE PLACEMENT WITH ULTRASOUND AND FLUOROSCOPIC GUIDANCE MEDICATIONS: None ANESTHESIA/SEDATION: None FLUOROSCOPY TIME:  Fluoroscopy Time: 18 seconds, 5.5 mGy COMPLICATIONS: None immediate. PROCEDURE: The patient was advised of the possible risks and complications and agreed to undergo the procedure. The patient was then  brought to the angiographic suite for the procedure. The left arm was prepped with chlorhexidine, draped in the usual sterile fashion using  maximum barrier technique (cap and mask, sterile gown, sterile gloves, large sterile sheet, hand hygiene and cutaneous antiseptic). Local anesthesia was attained by infiltration with 1% lidocaine. Ultrasound demonstrated patency of the left basilic vein, and this was documented with an image. Under real-time ultrasound guidance, this vein was accessed with a 21 gauge micropuncture needle and image documentation was performed. The needle was exchanged over a guidewire for a peel-away sheath through which a 51 cm 5 [place] single lumen power injectable PICC was advanced, and positioned with its tip at the lower SVC/right atrial junction. Fluoroscopy during the procedure and fluoro spot radiograph confirms appropriate catheter position. The catheter was flushed, secured to the skin, and covered with a sterile dressing. IMPRESSION: Successful placement of a left arm PICC with sonographic and fluoroscopic guidance. The catheter is ready for use. Electronically Signed   By: Markus Daft M.D.   On: 03/18/2016 09:14    ASSESSMENT: 66 y.o. [place] woman status post right breast upper outer quadrant and right axillary lymph node biopsy 07/19/2014 for a cT2  pN1, stage IIB  invasive ductal carcinoma, grade 2, estrogen and progesterone receptor negative, with an MIB-1 of 90%, and HER-2 amplified  (1) neoadjuvant chemotherapy started 08/08/2014, consisting of carboplatin, docetaxel, trastuzumab and pertuzumab given every 3 weeks 6, completed 11/21/2014  (a) breast MRI after initial 3 cycles shows a significant response  (b) breast MRI after 6 cycles shows a complete radiologic response   (2) trastuzumab continued through 10/02/2015 (to end of capecitabine therapy)  (a) echo 02/13/2015 shows an EF of 60-65%  (b)  Echo 05/09/2015 shows an ejection fraction of 60-65%  (c) echocardiogram 06/27/2015 shows an ejection fraction of 55-60%.  (3) right lumpectomy with sentinel lymph node biopsy on 12/27/14 showed a residual   ypT2 ypN1a, stage IIB invasive ductal carcinoma, grade 2, with repeat estrogen and progesterone receptors again negative  (4) status post right oncoplastic surgery (with left reduction mammoplasty) 01/06/2015 showing in the right breast multiple foci of intralymphatic carcinoma in random sections  (5)  Right modified radical mastectomy 02/15/2014 showed no residual invasive disease; there was DCIS but margins were negative; all 15 axillary lymph nodes were clear; ypTis ypN0  (a)  The patient has decided against reconstruction.  (6)  Postmastectomy radiation completed 05/18/2015 with capecitabine sensitization  (7) adjuvant capecitabine continued through 09/27/2015  (8) hepatic steatosis with increased fibrosis score (at risk for cirrhosis)  (9) thyroid biopsy (left lobe inferiorly) 2 shows benign tissue 06/28/2015  (10) LOCAL RECURRENCE:  (a) PET scan 12/06/2015 Notes a right retropectoral lymph node measuring 0.9 cm with an SUV max of 5.5  (b) repeat PET scan 03/11/2016 finds the right subpectoral lymph node to now measure 1.3 cm with an SUV max of 11.8.  (11) to start T-DM1 03/19/2016  PLAN: Darlene [name] will proceed with treatment TDM1  today.  I reviewed treatment with her and her husband in detail.  Her labs were stable when checked a couple of days ago and this was reviewed with her.  The working plan is for her to receive T-DM1 every 3 weeks x 3 cycles, then undergo PET/CT to evaluate response to treatment.  If there is a response, Dr. Jana Hakim will give her more, if there is not, then the area will be biopsied.  I reviewed this again with the patient.    Dispo: -  Return on 3/20 for labs, appointment, and infusion -PET scan ordered for 4/17, once scheduled will get patient on Dr. Virgie Dad schedule after PET so he can review the results with her.    All questions were answered, patient verbalized understanding of the above plan.  She knows to call for any questions concerns she may have  prior to her next appointment.    A total of (30) minutes of face-to-face time was spent with this patient with greater than 50% of that time in counseling and care-coordination.   Charlestine Massed, NP   03/21/2016 1:06 PM

## 2016-03-20 ENCOUNTER — Other Ambulatory Visit: Payer: Self-pay | Admitting: Oncology

## 2016-03-20 NOTE — Progress Notes (Unsigned)
[place]  [phone] [phone]) [phone]   ID: [name] DOB: 1950-02-13  MR#: [id number]  QBD#:[id number]  Patient Care Team: Vernie Shanks, MD as PCP - General (Family Medicine) Bryson Ha Himmelrich, RD (Inactive) as Dietitian (Bariatrics) Stark Klein, MD as Consulting Physician (General Surgery) Chauncey Cruel, MD as Consulting Physician (Oncology) Arloa Koh, MD as Consulting Physician (Radiation Oncology) Mauro Kaufmann, RN as Registered Nurse Rockwell Germany, RN as Registered Nurse Jacelyn Pi, MD as Consulting Physician (Endocrinology) Kem Boroughs, FNP as Nurse Practitioner (Family Medicine) [name], MD as Consulting Physician (Plastic Surgery) Larey Dresser, MD as Consulting Physician (Cardiology) PCP: Anthoney Harada, MD OTHER MD:  CHIEF COMPLAINT:  HER-2 positive, estrogen receptor negative locally recurrent disease  CURRENT TREATMENT:  T-DM1  INTERVAL HISTORY:  Darlene [name] returns today for follow-up of her HER-2/neu positive breast cancer accompanied by her husband, [name]. We have been following a right retropectoral lesion noted on PET scan 3 months ago. We just repeated the PET scan and the lesion has increased from 0.9 to 1.4 cmin the interval. The SUV max is also increased. There is a second lesion which is right next to a scar. This is no larger and is actually dimmer so hopefully that will not be a second area of involvement. We don't see any evidence of active disease in the lungs liver or bones or anywhere else in either PET scan.  Since her last visit with me she also had a scar revision on the right. The pathology from that procedure was benign ([id number] [phone]).   REVIEW OF SYSTEMS: Darlene [name] continues to work full-time. She is also going to the [employer] and enjoying it. She N [name] went to [place] for the holidays and enjoyed that as well. They're looking for to many trips in the future especially  once she retires at age 67. She tells me her diabetes is currently well-controlled. She has no symptoms related to her area of local recurrence.A detailed review of systems today was otherwise stable  BREAST CANCER HISTORY: From the original intake note:  "Darlene [name]" had screening mammography at her gynecologist suggestive of a possible abnormality in the right breast. However right diagnostic mammogram 04/12/2013 at [facility] was negative. More recently however, she noted a lump in her right breast and brought it to her gynecologist's attention. On 07/18/2014 the patient had bilateral diagnostic mammography with tomosynthesis at [facility]. The breast density was category B. In the right breast upper outer quadrant there was an area of focal asymmetry with indistinct margins.[name] Ultrasound was obtained and showed in addition to the mass in question, measuring 1.7 cm, an abnormal-appearing lymph node in the right axillary tail.  On 07/19/2014 the patient underwent biopsy of the right breast massin question as well as a right axillary lymph node. Both were positive for invasive ductal carcinoma, grade 2, estrogen and progesterone receptor negative, with an MIB-1 of 90%, and HER-2 pending. Incidentally the lymph node biopsy showed a lymphocytic inflammatory component but no lymph node architecture.  Her subsequent history is as detailed below.  PAST MEDICAL HISTORY: Past Medical History:  Diagnosis Date  . Anemia    hx of  . Anxiety   . Arthritis    in fingers, shoulders  . Balance problem   . Breast cancer ([place])   . Breast cancer of upper-outer quadrant of right female breast ([place]) 07/22/2014  . Clumsiness   . Constipation   . Depression   .  Diabetes mellitus without complication ([place])    35+ years  . Eating disorder    binge eating  . Epistaxis 08/26/2014  . Fatty liver   . HCAP (healthcare-associated pneumonia) 12/18/2014  . Headache   . Heart murmur    never had any problems  . Hyperlipidemia   .  Hypertension   . Neuromuscular disorder ([place])    diabetic neuropathy  . Obesity   . Obstructive sleep apnea on CPAP    uses cpap nightly  . Ovarian cyst rupture    possible  . Pneumonia   . Pneumonia 11/2014  . Radiation 04/05/15-05/19/15   right chest wall 50.4 Gy, mastectomy scar 10 Gy  . Thyroid nodule 06/2014    PAST SURGICAL HISTORY: Past Surgical History:  Procedure Laterality Date  . BIOPSY THYROID Bilateral 06/2014   2 nodules - Benign   . BREAST LUMPECTOMY WITH RADIOACTIVE SEED AND SENTINEL LYMPH NODE BIOPSY Right 12/27/2014   Procedure: BREAST LUMPECTOMY WITH RADIOACTIVE SEED AND SENTINEL LYMPH NODE BIOPSY;  Surgeon: Stark Klein, MD;  Location: [place];  Service: General;  Laterality: Right;  . BREAST REDUCTION SURGERY Bilateral 01/06/2015   Procedure: BILATERAL BREAST REDUCTION, RIGHT ONCOPLASTIC RECONSTRUCTION),LEFT BREAST REDUCTION FOR SYMMETRY;  Surgeon: [name], MD;  Location: [place];  Service: Plastics;  Laterality: Bilateral;  . BREATH TEK H PYLORI N/A 09/15/2012   Procedure: BREATH TEK H PYLORI;  Surgeon: Pedro Earls, MD;  Location: [facility];  Service: General;  Laterality: N/A;  . BUNIONECTOMY Left 12/2013  . CARPAL TUNNEL RELEASE Right   . CARPAL TUNNEL RELEASE Left   . DUPUYTREN CONTRACTURE RELEASE    . GASTRIC ROUX-EN-Y N/A 11/02/2012   Procedure: LAPAROSCOPIC ROUX-EN-Y GASTRIC;  Surgeon: Pedro Earls, MD;  Location: [facility];  Service: General;  Laterality: N/A;  . IR GENERIC HISTORICAL  03/18/2016   IR US GUIDE VASC ACCESS LEFT 03/18/2016 Markus Daft, MD [facility]  . IR GENERIC HISTORICAL  03/18/2016   IR FLUORO GUIDE CV LINE LEFT 03/18/2016 Markus Daft, MD [facility]  . LAPAROSCOPIC APPENDECTOMY N/A 07/22/2015   Procedure: APPENDECTOMY LAPAROSCOPIC;  Surgeon: Michael Boston, MD;  Location: [facility];  Service: General;  Laterality: N/A;  . MASTECTOMY Right 02/15/2015   modified  . MODIFIED MASTECTOMY Right 02/15/2015    Procedure: RIGHT MODIFIED RADICAL MASTECTOMY;  Surgeon: Stark Klein, MD;  Location: [place];  Service: General;  Laterality: Right;  . PORT-A-CATH REMOVAL Left 10/10/2015   Procedure: PORT REMOVAL;  Surgeon: Stark Klein, MD;  Location: [place];  Service: General;  Laterality: Left;  . PORTACATH PLACEMENT Left 08/02/2014   Procedure: INSERTION PORT-A-CATH;  Surgeon: Stark Klein, MD;  Location: [place];  Service: General;  Laterality: Left;  . ROTATOR CUFF REPAIR Right 2005  . SCAR REVISION Right 12/08/2015   Procedure: COMPLEX REPAIR RIGHT CHEST 10-15CM;  Surgeon: [name], MD;  Location: [place];  Service: Plastics;  Laterality: Right;  . TOE SURGERY  1970s   bone spur  . TRIGGER FINGER RELEASE     x3    FAMILY HISTORY Family History  Problem Relation Age of Onset  . CVA Mother   . Diabetes Father   . Heart failure Father   . Hypertension Sister   . Diabetes Brother   . Breast cancer Paternal Grandmother   . Diabetes Paternal Uncle    the patient's father died from complications of diabetes at age 54. The patient's mother died at age  68 with a subarachnoid hemorrhage. The patient had one brother, one sister. The only breast cancer was the patient's paternal grandmother diagnosed in her 64s. There is no history of ovarian cancer in the family.  GYNECOLOGIC HISTORY:  Patient's last menstrual period was 10/22/2007.  menarche age 78, the patient is GX P0. She went through the change of life in 2011. She did not take hormone replacement.  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 Her husband [name] is a retired Visual merchandiser (he taught at [employer]).  [name] has 2 children from an earlier marriage, Rockey Situ who teaches in [place], and Maylynn Orzechowski,  who works at the [employer] in in [place]. He has 1 grand son, aged 27. The patient and her husband are not church  attender's    ADVANCED DIRECTIVES:  Not in place   HEALTH MAINTENANCE: Social History  Substance Use Topics  . Smoking status: Never Smoker  . Smokeless tobacco: Never Used  . Alcohol use 0.0 oz/week     Comment: social     Colonoscopy:  May 2016  PAP:  Bone density: 07/05/2013 normal, with a T score of 0.4  Lipid panel:  Allergies  Allergen Reactions  . Nsaids Other (See Comments)    H/o gastric bypass - avoid NSAIDs  . Peanuts [Peanut Oil] Itching    "Large amounts"  . Tape Hives and Rash    Adhesives in bandaids    Current Outpatient Prescriptions  Medication Sig Dispense Refill  . acetaminophen (TYLENOL) 325 MG tablet Take 650 mg by mouth 2 (two) times daily as needed for moderate pain or headache. Reported on 04/17/2015    . calcium citrate-vitamin D (CITRACAL+D) 315-200 MG-UNIT tablet Take 1 tablet by mouth 3 (three) times daily.    . diphenhydrAMINE (BENADRYL) 25 MG tablet Take 25 mg by mouth daily as needed for allergies or sleep. Reported on 03/06/2015    . Docusate Sodium (COLACE PO) Take by mouth. Takes a day    . glucose blood (ONETOUCH VERIO) test strip 1 each by Other route as needed for other. Reported on 03/06/2015    . losartan (COZAAR) 100 MG tablet Take 1 tablet by mouth daily. Reported on 03/06/2015    . Melatonin 5 MG TABS Take 5 mg by mouth at bedtime. Reported on 03/06/2015    . metoCLOPramide (REGLAN) 5 MG tablet TAKE 1 TABLET THREE TIMES DAILY BEFORE MEALS. 90 tablet 0  . mometasone (NASONEX) 50 MCG/ACT nasal spray Place 1-2 sprays into the nose daily as needed (for allergies.).     [name] NOVOLIN 70/30 (70-30) 100 UNIT/ML injection Inject 5-10 Units into the skin 2 (two) times daily with a meal. Reported on 03/06/2015    . Pediatric Multivitamins-Iron (FLINTSTONES PLUS IRON) chewable tablet Chew 1 tablet by mouth 2 (two) times daily.    . Polyethylene Glycol 3350 (MIRALAX PO) Take 1 packet by mouth as needed.    . Probiotic Product ([employer] PROBIOTIC PO) Take 1  capsule by mouth daily. Reported on 03/06/2015    . Propylene Glycol (SYSTANE BALANCE OP) Apply 1-2 drops to eye 3 (three) times daily.    . Terbinafine HCl (LAMISIL PO) Take by mouth.    . Vortioxetine HBr (TRINTELLIX) 20 MG TABS Take 20 mg by mouth at bedtime.      No current facility-administered medications for this visit.     OBJECTIVE:  Middle-aged white womanwho appears stated  age There were no vitals filed for this visit.   There is no height or weight on file to calculate BMI.    ECOG FS:1 - Symptomatic but completely ambulatory There were no vitals filed for this visit. Sclerae unicteric, EOMs intact Oropharynx clear and moist No cervical or supraclavicular adenopathy Lungs no rales or rhonchi Heart regular rate and rhythm Abd soft, nontender, positive bowel sounds MSK no focal spinal tenderness, no upper extremity lymphedema Neuro: nonfocal, well oriented, appropriate affect Breasts: the right breast is status post mastectomy. There is no palpable evidence of recurrence. The right axilla is benign. The left breast is unremarkable.   LAB RESULTS:  CMP     Component Value Date/Time   NA 141 03/19/2016 0815   K 4.3 03/19/2016 0815   CL 105 12/08/2015 1028   CO2 25 03/19/2016 0815   GLUCOSE 213 (H) 03/19/2016 0815   BUN 28.7 (H) 03/19/2016 0815   CREATININE 1.3 (H) 03/19/2016 0815   CALCIUM 9.1 03/19/2016 0815   PROT 6.3 (L) 03/19/2016 0815   ALBUMIN 3.7 03/19/2016 0815   AST 18 03/19/2016 0815   ALT 24 03/19/2016 0815   ALKPHOS 79 03/19/2016 0815   BILITOT 0.50 03/19/2016 0815   GFRNONAA 51 (L) 10/10/2015 1007   GFRNONAA 55 (L) 02/18/2013 0827   GFRAA 59 (L) 10/10/2015 1007   GFRAA 63 02/18/2013 0827    INo results found for: SPEP, UPEP  Lab Results  Component Value Date   WBC 4.9 03/19/2016   NEUTROABS 3.3 03/19/2016   HGB 11.3 (L) 03/19/2016   HCT 33.0 (L) 03/19/2016   MCV 91.6 03/19/2016   PLT 183 03/19/2016      Chemistry      Component Value  Date/Time   NA 141 03/19/2016 0815   K 4.3 03/19/2016 0815   CL 105 12/08/2015 1028   CO2 25 03/19/2016 0815   BUN 28.7 (H) 03/19/2016 0815   CREATININE 1.3 (H) 03/19/2016 0815      Component Value Date/Time   CALCIUM 9.1 03/19/2016 0815   ALKPHOS 79 03/19/2016 0815   AST 18 03/19/2016 0815   ALT 24 03/19/2016 0815   BILITOT 0.50 03/19/2016 0815       No results found for: LABCA2  No components found for: LABCA125  No results for input(s): INR in the last 168 hours.  Urinalysis    Component Value Date/Time   COLORURINE YELLOW 07/21/2015 [place] 07/21/2015 2328   LABSPEC 1.020 07/21/2015 2328   PHURINE 6.0 07/21/2015 2328   GLUCOSEU NEGATIVE 07/21/2015 2328   HGBUR TRACE (A) 07/21/2015 [place] 07/21/2015 2328   BILIRUBINUR neg 06/29/2013 [place] 07/21/2015 2328   PROTEINUR 30 (A) 07/21/2015 2328   UROBILINOGEN negative 06/29/2013 1017   NITRITE NEGATIVE 07/21/2015 2328   LEUKOCYTESUR NEGATIVE 07/21/2015 2328    STUDIES: Nm Pet Image Restag (ps) Skull Base To Thigh  Result Date: 03/11/2016 CLINICAL DATA:  Subsequent treatment strategy for right upper outer quadrant breast carcinoma with metastatic disease to axillary lymph nodes. EXAM: NUCLEAR MEDICINE PET SKULL BASE TO THIGH TECHNIQUE: 11.2 mCi F-18 FDG was injected intravenously. Full-ring PET imaging was performed from the skull base to thigh after the radiotracer. CT data was obtained and used for attenuation correction and anatomic localization. FASTING BLOOD GLUCOSE:  Value: 131 mg/dl COMPARISON:  12/06/2015 FINDINGS: NECK No hypermetabolic lymph nodes in the neck. CHEST Mildly hypermetabolic subcutaneous nodule within upper left anterior chest  wall corresponds to previous site of Port-A-Cath. Postop changes from right mastectomy and axillary lymph node dissection again demonstrated. 1.5 cm soft tissue nodule adjacent to right axillary surgical clips on image  62/4 is stable in size, and has SUV max of 3.3 compared to 3.5 previously. A 1.3 cm right subpectoral lymph node on image 51/4 has increased in size from 9 mm previously. This has SUV max of 11.8 compared to 5.5 previously. No hypermetabolic mediastinal or hilar nodes. No suspicious pulmonary nodules on the CT scan. Aortic and coronary artery atherosclerosis. ABDOMEN/PELVIS No abnormal hypermetabolic activity within the liver, pancreas, adrenal glands, or spleen. No hypermetabolic lymph nodes in the abdomen or pelvis. Stable postop changes from previous gastric bypass surgery. Tiny calcified gallstones again seen, without evidence cholecystitis. Aortic atherosclerosis. Colonic diverticulosis again demonstrated, without evidence of diverticulitis. Small calcified fibroid again seen in the right uterine fundus. Aortic atherosclerosis. SKELETON No focal hypermetabolic activity to suggest skeletal metastasis. IMPRESSION: Mild increase in hypermetabolic right subpectoral lymph node. Stable hypermetabolic soft tissue density adjacent to surgical clips from right axillary lymph node dissection, which may be due to postop changes or metastatic lymphadenopathy. No new or progressive disease identified. Electronically Signed   By: Earle Gell M.D.   On: 03/11/2016 09:53   Ir Fluoro Guide Cv Line Left  Result Date: 03/18/2016 INDICATION: 66 year old with right breast cancer. PICC line needed for chemotherapy. EXAM: PICC LINE PLACEMENT WITH ULTRASOUND AND FLUOROSCOPIC GUIDANCE MEDICATIONS: None ANESTHESIA/SEDATION: None FLUOROSCOPY TIME:  Fluoroscopy Time: 18 seconds, 5.5 mGy COMPLICATIONS: None immediate. PROCEDURE: The patient was advised of the possible risks and complications and agreed to undergo the procedure. The patient was then brought to the angiographic suite for the procedure. The left arm was prepped with chlorhexidine, draped in the usual sterile fashion using maximum barrier technique (cap and mask, sterile  gown, sterile gloves, large sterile sheet, hand hygiene and cutaneous antiseptic). Local anesthesia was attained by infiltration with 1% lidocaine. Ultrasound demonstrated patency of the left basilic vein, and this was documented with an image. Under real-time ultrasound guidance, this vein was accessed with a 21 gauge micropuncture needle and image documentation was performed. The needle was exchanged over a guidewire for a peel-away sheath through which a 51 cm 5 [place] single lumen power injectable PICC was advanced, and positioned with its tip at the lower SVC/right atrial junction. Fluoroscopy during the procedure and fluoro spot radiograph confirms appropriate catheter position. The catheter was flushed, secured to the skin, and covered with a sterile dressing. IMPRESSION: Successful placement of a left arm PICC with sonographic and fluoroscopic guidance. The catheter is ready for use. Electronically Signed   By: Markus Daft M.D.   On: 03/18/2016 09:14   Ir US Guide Vasc Access Left  Result Date: 03/18/2016 INDICATION: 66 year old with right breast cancer. PICC line needed for chemotherapy. EXAM: PICC LINE PLACEMENT WITH ULTRASOUND AND FLUOROSCOPIC GUIDANCE MEDICATIONS: None ANESTHESIA/SEDATION: None FLUOROSCOPY TIME:  Fluoroscopy Time: 18 seconds, 5.5 mGy COMPLICATIONS: None immediate. PROCEDURE: The patient was advised of the possible risks and complications and agreed to undergo the procedure. The patient was then brought to the angiographic suite for the procedure. The left arm was prepped with chlorhexidine, draped in the usual sterile fashion using maximum barrier technique (cap and mask, sterile gown, sterile gloves, large sterile sheet, hand hygiene and cutaneous antiseptic). Local anesthesia was attained by infiltration with 1% lidocaine. Ultrasound demonstrated patency of the left basilic vein, and this was documented with an image. Under  real-time ultrasound guidance, this vein was accessed with  a 21 gauge micropuncture needle and image documentation was performed. The needle was exchanged over a guidewire for a peel-away sheath through which a 51 cm 5 [place] single lumen power injectable PICC was advanced, and positioned with its tip at the lower SVC/right atrial junction. Fluoroscopy during the procedure and fluoro spot radiograph confirms appropriate catheter position. The catheter was flushed, secured to the skin, and covered with a sterile dressing. IMPRESSION: Successful placement of a left arm PICC with sonographic and fluoroscopic guidance. The catheter is ready for use. Electronically Signed   By: Markus Daft M.D.   On: 03/18/2016 09:14    ASSESSMENT: 66 y.o.  woman status post right breast upper outer quadrant and right axillary lymph node biopsy 07/19/2014 for a cT2  pN1, stage IIB  invasive ductal carcinoma, grade 2, estrogen and progesterone receptor negative, with an MIB-1 of 90%, and HER-2 amplified  (1) neoadjuvant chemotherapy started 08/08/2014, consisting of carboplatin, docetaxel, trastuzumab and pertuzumab given every 3 weeks 6, completed 11/21/2014  (a) breast MRI after initial 3 cycles shows a significant response  (b) breast MRI after 6 cycles shows a complete radiologic response   (2) trastuzumab continued through 10/02/2015 (to end of capecitabine therapy)  (a) echo 02/13/2015 shows an EF of 60-65%  (b)  Echo 05/09/2015 shows an ejection fraction of 60-65%  (c) echocardiogram 06/27/2015 shows an ejection fraction of 55-60%.  (3) right lumpectomy with sentinel lymph node biopsy on 12/27/14 showed a residual  ypT2 ypN1a, stage IIB invasive ductal carcinoma, grade 2, with repeat estrogen and progesterone receptors again negative  (4) status post right oncoplastic surgery (with left reduction mammoplasty) 01/06/2015 showing in the right breast multiple foci of intralymphatic carcinoma in random sections  (5)  Right modified radical mastectomy 02/15/2014  showed no residual invasive disease; there was DCIS but margins were negative; all 15 axillary lymph nodes were clear; ypTis ypN0  (a)  The patient has decided against reconstruction.  (6)  Postmastectomy radiation completed 05/18/2015 with capecitabine sensitization  (7) adjuvant capecitabine continued through 09/27/2015  (8) hepatic steatosis with increased fibrosis score (at risk for cirrhosis)  (9) thyroid biopsy (left lobe inferiorly) 2 shows benign tissue 06/28/2015  (10) LOCAL RECURRENCE:  (a) PET scan 12/06/2015 Notes a right retropectoral lymph node measuring 0.9 cm with an SUV max of 5.5  (b) repeat PET scan 03/11/2016 finds the right subpectoral lymph node to now measure 1.3 cm with an SUV max of 11.8.  (11) to start T-DM1 03/19/2016  PLAN: I spent approximately 30 minutes with Darlene [name] with most of that time spent discussing her complex problems. Unfortunately N does seem to have locally recurrent disease. The retropectoral lymph node is larger and hotter. The other lesion, next to a scar, is no larger and is a little dimmer so that is of much less concerned, although of course cancer there is not ruled out.  We could proceed to biopsy of this lesion although that might be technically somewhat difficult because of its size and location. We also could consider stereotactic radiosurgery since it is the only site of disease at present that we can identify. Note of course that she has had radiation to this area previously.  However I would treat this as a harbinger of systemic recurrence. It would make more sense I think to treat with T-DM 143 cycles and then restage. If we get a good response we willhave learned that this is indeed  breast cancer and that it is still HER-2 amplified. In that case we would continue the TDM 1 to a total of 8 cycles and then switched to Herceptin and continue that for a minimum of a year or 2 .  On the other hand if we do not get a response we would  proceed to biopsy, possibly excisional, of this lesion and then consideration of stereotactic radiosurgery.  Mekayla is agreeable to this plan. She is concerned about expense. They have recently switched insurance and they're having quite a bit of out of pocket expenses to deal with.  I am setting her up for a PICC line. They understand the possible complications of this and that it will need to be flushed at home. If we do get a response and plan on long-term anti-HER-2 treatment we will switch to a port. I'm also writing her for an echo. We are hoping to start the TM 1 a week from today.  They know to call for any problems that may develop before her next visit here.  Chauncey Cruel, MD   03/20/2016 11:02 AM    ADDENDUM: I have been asked to clarify the line of therapy for [facility]. She received trastuzumab and Pertuzumab previously. Accordingly her T-DM 1 is not first-line treatment for second line treatment.

## 2016-03-21 ENCOUNTER — Ambulatory Visit (HOSPITAL_BASED_OUTPATIENT_CLINIC_OR_DEPARTMENT_OTHER): Payer: Commercial Managed Care - PPO

## 2016-03-21 ENCOUNTER — Encounter: Payer: Self-pay | Admitting: Adult Health

## 2016-03-21 ENCOUNTER — Encounter: Payer: Self-pay | Admitting: *Deleted

## 2016-03-21 ENCOUNTER — Encounter: Payer: Self-pay | Admitting: General Practice

## 2016-03-21 ENCOUNTER — Ambulatory Visit (HOSPITAL_BASED_OUTPATIENT_CLINIC_OR_DEPARTMENT_OTHER): Payer: Commercial Managed Care - PPO | Admitting: Adult Health

## 2016-03-21 ENCOUNTER — Other Ambulatory Visit: Payer: Commercial Managed Care - PPO

## 2016-03-21 ENCOUNTER — Other Ambulatory Visit: Payer: Self-pay | Admitting: Oncology

## 2016-03-21 VITALS — BP 155/56 | HR 71 | Temp 97.8°F | Resp 18 | Wt 233.7 lb

## 2016-03-21 DIAGNOSIS — C7989 Secondary malignant neoplasm of other specified sites: Secondary | ICD-10-CM

## 2016-03-21 DIAGNOSIS — Z5112 Encounter for antineoplastic immunotherapy: Secondary | ICD-10-CM

## 2016-03-21 DIAGNOSIS — C50411 Malignant neoplasm of upper-outer quadrant of right female breast: Secondary | ICD-10-CM

## 2016-03-21 DIAGNOSIS — Z171 Estrogen receptor negative status [ER-]: Secondary | ICD-10-CM

## 2016-03-21 DIAGNOSIS — C50911 Malignant neoplasm of unspecified site of right female breast: Secondary | ICD-10-CM

## 2016-03-21 DIAGNOSIS — C773 Secondary and unspecified malignant neoplasm of axilla and upper limb lymph nodes: Secondary | ICD-10-CM

## 2016-03-21 MED ORDER — SODIUM CHLORIDE 0.9 % IV SOLN
Freq: Once | INTRAVENOUS | Status: AC
  Administered 2016-03-21: 12:00:00 via INTRAVENOUS

## 2016-03-21 MED ORDER — DIPHENHYDRAMINE HCL 25 MG PO CAPS
50.0000 mg | ORAL_CAPSULE | Freq: Once | ORAL | Status: AC
  Administered 2016-03-21: 50 mg via ORAL

## 2016-03-21 MED ORDER — HEPARIN SOD (PORK) LOCK FLUSH 100 UNIT/ML IV SOLN
250.0000 [IU] | Freq: Once | INTRAVENOUS | Status: AC | PRN
  Administered 2016-03-21: 250 [IU]
  Filled 2016-03-21: qty 5

## 2016-03-21 MED ORDER — ACETAMINOPHEN 325 MG PO TABS
ORAL_TABLET | ORAL | Status: AC
  Filled 2016-03-21: qty 2

## 2016-03-21 MED ORDER — ACETAMINOPHEN 325 MG PO TABS
650.0000 mg | ORAL_TABLET | Freq: Once | ORAL | Status: AC
  Administered 2016-03-21: 650 mg via ORAL

## 2016-03-21 MED ORDER — SODIUM CHLORIDE 0.9% FLUSH
10.0000 mL | INTRAVENOUS | Status: DC | PRN
  Administered 2016-03-21: 10 mL
  Filled 2016-03-21: qty 10

## 2016-03-21 MED ORDER — SODIUM CHLORIDE 0.9 % IV SOLN
3.4000 mg/kg | Freq: Once | INTRAVENOUS | Status: AC
  Administered 2016-03-21: 360 mg via INTRAVENOUS
  Filled 2016-03-21: qty 10

## 2016-03-21 MED ORDER — DIPHENHYDRAMINE HCL 25 MG PO CAPS
ORAL_CAPSULE | ORAL | Status: AC
  Filled 2016-03-21: qty 2

## 2016-03-21 NOTE — Patient Instructions (Signed)
[place] [facility] Discharge Instructions for Patients Receiving Chemotherapy  Today you received the following chemotherapy agents ado-trastuzumab emtansine (Kadcyla).  To help prevent nausea and vomiting after your treatment, we encourage you to take your nausea medication as directed by your MD.   If you develop nausea and vomiting that is not controlled by your nausea medication, call the clinic.   BELOW ARE SYMPTOMS THAT SHOULD BE REPORTED IMMEDIATELY:  *FEVER GREATER THAN 100.5 F  *CHILLS WITH OR WITHOUT FEVER  NAUSEA AND VOMITING THAT IS NOT CONTROLLED WITH YOUR NAUSEA MEDICATION  *UNUSUAL SHORTNESS OF BREATH  *UNUSUAL BRUISING OR BLEEDING  TENDERNESS IN MOUTH AND THROAT WITH OR WITHOUT PRESENCE OF ULCERS  *URINARY PROBLEMS  *BOWEL PROBLEMS  UNUSUAL RASH Items with * indicate a potential emergency and should be followed up as soon as possible.  Feel free to call the clinic you have any questions or concerns. The clinic phone number is [phone].  Please show the [facility] at check-in to the Emergency Department and triage nurse.  Ado-Trastuzumab Emtansine for injection What is this medicine? ADO-TRASTUZUMAB EMTANSINE (ADD oh traz TOO zuh mab em TAN zine) is a monoclonal antibody combined with chemotherapy. It is used to treat breast cancer. This medicine may be used for other purposes; ask your health care provider or pharmacist if you have questions. COMMON BRAND NAME(S): Kadcyla What should I tell my health care provider before I take this medicine? They need to know if you have any of these conditions: -heart disease -heart failure -infection (especially a virus infection such as chickenpox, cold sores, or herpes) -liver disease -lung or breathing disease, like asthma -an unusual or allergic reaction to ado-trastuzumab emtansine, other medications, foods, dyes, or preservatives -pregnant or trying to get pregnant -breast-feeding How should  I use this medicine? This medicine is for infusion into a vein. It is given by a health care professional in a hospital or clinic setting. Talk to your pediatrician regarding the use of this medicine in children. Special care may be needed. Overdosage: If you think you have taken too much of this medicine contact a poison control center or emergency room at once. NOTE: This medicine is only for you. Do not share this medicine with others. What if I miss a dose? It is important not to miss your dose. Call your doctor or health care professional if you are unable to keep an appointment. What may interact with this medicine? This medicine may also interact with the following medications: -atazanavir -boceprevir -clarithromycin -delavirdine -indinavir -dalfopristin; quinupristin -isoniazid, INH -itraconazole -ketoconazole -nefazodone -nelfinavir -ritonavir -telaprevir -telithromycin -tipranavir -voriconazole This list may not describe all possible interactions. Give your health care provider a list of all the medicines, herbs, non-prescription drugs, or dietary supplements you use. Also tell them if you smoke, drink alcohol, or use illegal drugs. Some items may interact with your medicine. What should I watch for while using this medicine? Visit your doctor for checks on your progress. This drug may make you feel generally unwell. This is not uncommon, as chemotherapy can affect healthy cells as well as cancer cells. Report any side effects. Continue your course of treatment even though you feel ill unless your doctor tells you to stop. You may need blood work done while you are taking this medicine. Call your doctor or health care professional for advice if you get a fever, chills or sore throat, or other symptoms of a cold or flu. Do not treat yourself. This  drug decreases your body's ability to fight infections. Try to avoid being around people who are sick. Be careful brushing and  flossing your teeth or using a toothpick because you may get an infection or bleed more easily. If you have any dental work done, tell your dentist you are receiving this medicine. Avoid taking products that contain aspirin, acetaminophen, ibuprofen, naproxen, or ketoprofen unless instructed by your doctor. These medicines may hide a fever. Do not become pregnant while taking this medicine or for 7 months after stopping it, men with female partners should use contraception during treatment and for 4 months after the last dose. Women should inform their doctor if they wish to become pregnant or think they might be pregnant. There is a potential for serious side effects to an unborn child. Do not breast-feed an infant while taking this medicine or for 7 months after the last dose. Men who have a partner who is pregnant or who is capable of becoming pregnant should use a condom during sexual activity while taking this medicine and for 4 months after stopping it. Men should inform their doctors if they wish to father a child. This medicine may lower sperm counts. Talk to your health care professional or pharmacist for more information. What side effects may I notice from receiving this medicine? Side effects that you should report to your doctor or health care professional as soon as possible: -allergic reactions like skin rash, itching or hives, swelling of the face, lips, or tongue -breathing problems -chest pain or palpitations -fever or chills, sore throat -general ill feeling or flu-like symptoms -light-colored stools -nausea, vomiting -pain, tingling, numbness in the hands or feet -signs and symptoms of bleeding such as bloody or black, tarry stools; red or dark-brown urine; spitting up blood or brown material that looks like coffee grounds; red spots on the skin; unusual bruising or bleeding from the eye, gums, or nose -swelling of the legs or ankles -yellowing of the eyes or skin Side effects  that usually do not require medical attention (report to your doctor or health care professional if they continue or are bothersome): -changes in taste -constipation -dizziness -headache -joint pain -muscle pain -trouble sleeping -unusually weak or tired This list may not describe all possible side effects. Call your doctor for medical advice about side effects. You may report side effects to FDA at 1-800-FDA-1088. Where should I keep my medicine? This drug is given in a hospital or clinic and will not be stored at home. NOTE: This sheet is a summary. It may not cover all possible information. If you have questions about this medicine, talk to your doctor, pharmacist, or health care provider.  2018 Elsevier/Gold Standard (2015-02-27 12:11:06)

## 2016-03-21 NOTE — Progress Notes (Signed)
[facility] Spiritual Care Note  Met Ms Riggins and her husband in infusion per referral for caregiver support to spouse from Nurse Navigator Dawn Stuart/RN.  Mr Fant engaged in sharing and reflecting on stressors related to caregiving, social  isolation of retirement, and self-care that he had deferred during pt's first round of tx.  Pt was present and supportive during conversation.  Mr Slevin verbalized that the conversation and opportunity to process were helpful to him and suggested that I return to infusion at pt's next tx (3/20).  We plan to f/u then for further spiritual/emotional processing, but please also page if immediate needs arise.  Thank you.   [place], [place], [facility] Pager [phone] Voicemail [phone]

## 2016-03-22 ENCOUNTER — Telehealth: Payer: Self-pay

## 2016-03-22 ENCOUNTER — Other Ambulatory Visit: Payer: Self-pay | Admitting: Emergency Medicine

## 2016-03-22 DIAGNOSIS — C50411 Malignant neoplasm of upper-outer quadrant of right female breast: Secondary | ICD-10-CM

## 2016-03-22 MED ORDER — PROCHLORPERAZINE MALEATE 10 MG PO TABS: 10.0000 mg | ORAL_TABLET | Freq: Four times a day (QID) | ORAL | 0 refills | Status: DC | PRN

## 2016-03-22 NOTE — Telephone Encounter (Signed)
Pt returned call and stated she is doing fine, no body aches, no nausea, eating and drinking OK. She has no antiemetic at home. S/w Dr Jana Hakim and ordered compazine prn

## 2016-03-22 NOTE — Addendum Note (Signed)
Addended by: Janace Hoard on: 03/22/2016 01:23 PM   Modules accepted: Orders

## 2016-03-22 NOTE — Telephone Encounter (Signed)
-----   Message from Johann Capers, RN sent at 03/21/2016 11:42 AM EST ----- Regarding: Dr. Jana Hakim - first time kadcyla pt Dr. Jana Hakim first-time [facility] pt f/u phone call

## 2016-03-22 NOTE — Telephone Encounter (Signed)
lvm f/u chemo on both home and cell.

## 2016-03-25 ENCOUNTER — Ambulatory Visit (HOSPITAL_BASED_OUTPATIENT_CLINIC_OR_DEPARTMENT_OTHER): Payer: Commercial Managed Care - PPO

## 2016-03-25 ENCOUNTER — Encounter: Payer: Self-pay | Admitting: Neurology

## 2016-03-25 DIAGNOSIS — Z452 Encounter for adjustment and management of vascular access device: Secondary | ICD-10-CM

## 2016-03-25 DIAGNOSIS — C50411 Malignant neoplasm of upper-outer quadrant of right female breast: Secondary | ICD-10-CM | POA: Diagnosis not present

## 2016-03-25 MED ORDER — SODIUM CHLORIDE 0.9% FLUSH
10.0000 mL | INTRAVENOUS | Status: AC | PRN
  Administered 2016-03-25: 10 mL
  Filled 2016-03-25: qty 10

## 2016-03-25 MED ORDER — HEPARIN SOD (PORK) LOCK FLUSH 100 UNIT/ML IV SOLN
250.0000 [IU] | INTRAVENOUS | Status: AC | PRN
  Administered 2016-03-25: 250 [IU]
  Filled 2016-03-25: qty 5

## 2016-03-25 NOTE — Progress Notes (Signed)
Pt in for PICC dressing change. Mild erythema noted at insertion site- otherwise unremarkable. Pt stated several times "I hate the PICC, I want it removed." Recommended she keep it in place until next infusion. She is not interested. Requested to have PICC removed during 3/8 flush appointment. Pt stated she would rather try treatments via peripheral veins. Informed her I will make MD aware of request.

## 2016-03-25 NOTE — Patient Instructions (Signed)
PICC Home Guide °A peripherally inserted central catheter (PICC) is a long, thin, flexible tube that is inserted into a vein in the upper arm. It is a form of intravenous (IV) access. It is considered to be a "central" line because the tip of the PICC ends in a large vein in your chest. This large vein is called the superior vena cava (SVC). The PICC tip ends in the SVC because there is a lot of blood flow in the SVC. This allows medicines and IV fluids to be quickly distributed throughout the body. The PICC is inserted using a sterile technique by a specially trained nurse or physician. After the PICC is inserted, a chest X-ray exam is done to be sure it is in the correct place. °A PICC may be placed for different reasons, such as: °· To give medicines and liquid nutrition that can only be given through a central line. Examples are: °? Certain antibiotic treatments. °? Chemotherapy. °? Total parenteral nutrition (TPN). °· To take frequent blood samples. °· To give IV fluids and blood products. °· If there is difficulty placing a peripheral intravenous (PIV) catheter. ° °If taken care of properly, a PICC can remain in place for several months. A PICC can also allow a person to go home from the hospital early. Medicine and PICC care can be managed at home by a family member or home health care team. °What problems can happen when I have a PICC? °Problems with a PICC can occasionally occur. These may include the following: °· A blood clot (thrombus) forming in or at the tip of the PICC. This can cause the PICC to become clogged. A clot-dissolving medicine called tissue plasminogen activator (tPA) can be given through the PICC to help break up the clot. °· Inflammation of the vein (phlebitis) in which the PICC is placed. Signs of inflammation may include redness, pain at the insertion site, red streaks, or being able to feel a "cord" in the vein where the PICC is located. °· Infection in the PICC or at the insertion  site. Signs of infection may include fever, chills, redness, swelling, or pus drainage from the PICC insertion site. °· PICC movement (malposition). The PICC tip may move from its original position due to excessive physical activity, forceful coughing, sneezing, or vomiting. °· A break or cut in the PICC. It is important to not use scissors near the PICC. °· Nerve or tendon irritation or injury during PICC insertion. ° °What should I keep in mind about activities when I have a PICC? °· You may bend your arm and move it freely. If your PICC is near or at the bend of your elbow, avoid activity with repeated motion at the elbow. °· Rest at home for the remainder of the day following PICC line insertion. °· Avoid lifting heavy objects as instructed by your health care provider. °· Avoid using a crutch with the arm on the same side as your PICC. You may need to use a walker. °What should I know about my PICC dressing? °· Keep your PICC bandage (dressing) clean and dry to prevent infection. °? Ask your health care provider when you may shower. Ask your health care provider to teach you how to wrap the PICC when you do take a shower. °· Change the PICC dressing as instructed by your health care provider. °· Change your PICC dressing if it becomes loose or wet. °What should I know about PICC care? °· Check the PICC insertion   site daily for leakage, redness, swelling, or pain. °· Do not take a bath, swim, or use hot tubs when you have a PICC. Cover PICC line with clear plastic wrap and tape to keep it dry while showering. °· Flush the PICC as directed by your health care provider. Let your health care provider know right away if the PICC is difficult to flush or does not flush. Do not use force to flush the PICC. °· Do not use a syringe that is less than 10 mL to flush the PICC. °· Never pull or tug on the PICC. °· Avoid blood pressure checks on the arm with the PICC. °· Keep your PICC identification card with you at all  times. °· Do not take the PICC out yourself. Only a trained clinical professional should remove the PICC. °Get help right away if: °· Your PICC is accidentally pulled all the way out. If this happens, cover the insertion site with a bandage or gauze dressing. Do not throw the PICC away. Your health care provider will need to inspect it. °· Your PICC was tugged or pulled and has partially come out. Do not  push the PICC back in. °· There is any type of drainage, redness, or swelling where the PICC enters the skin. °· You cannot flush the PICC, it is difficult to flush, or the PICC leaks around the insertion site when it is flushed. °· You hear a "flushing" sound when the PICC is flushed. °· You have pain, discomfort, or numbness in your arm, shoulder, or jaw on the same side as the PICC. °· You feel your heart "racing" or skipping beats. °· You notice a hole or tear in the PICC. °· You develop chills or a fever. °This information is not intended to replace advice given to you by your health care provider. Make sure you discuss any questions you have with your health care provider. °Document Released: 07/14/2002 Document Revised: 07/28/2015 Document Reviewed: 10/30/2012 °Elsevier Interactive Patient Education © 2017 Elsevier Inc. ° °

## 2016-03-26 ENCOUNTER — Ambulatory Visit (INDEPENDENT_AMBULATORY_CARE_PROVIDER_SITE_OTHER): Payer: Commercial Managed Care - PPO | Admitting: Neurology

## 2016-03-26 ENCOUNTER — Encounter: Payer: Self-pay | Admitting: Neurology

## 2016-03-26 VITALS — HR 60 | Resp 20 | Ht 69.0 in | Wt 230.0 lb

## 2016-03-26 DIAGNOSIS — Z9989 Dependence on other enabling machines and devices: Secondary | ICD-10-CM | POA: Diagnosis not present

## 2016-03-26 DIAGNOSIS — G4733 Obstructive sleep apnea (adult) (pediatric): Secondary | ICD-10-CM | POA: Diagnosis not present

## 2016-03-26 NOTE — Patient Instructions (Signed)
Peripheral Neuropathy Peripheral neuropathy is a type of nerve damage. It affects nerves that carry signals between the spinal cord and other parts of the body. These are called peripheral nerves. With peripheral neuropathy, one nerve or a group of nerves may be damaged. What are the causes? Many things can damage peripheral nerves. For some people with peripheral neuropathy, the cause is unknown. Some causes include:  Diabetes. This is the most common cause of peripheral neuropathy.  Injury to a nerve.  Pressure or stress on a nerve that lasts a long time.  Too little vitamin B. Alcoholism can lead to this.  Infections.  Autoimmune diseases, such as multiple sclerosis and systemic lupus erythematosus.  Inherited nerve diseases.  Some medicines, such as cancer drugs.  Toxic substances, such as lead and mercury.  Too little blood flowing to the legs.  Kidney disease.  Thyroid disease.  What are the signs or symptoms? Different people have different symptoms. The symptoms you have will depend on which of your nerves is damaged. Common symptoms include:  Loss of feeling (numbness) in the feet and hands.  Tingling in the feet and hands.  Pain that burns.  Very sensitive skin.  Weakness.  Not being able to move a part of the body (paralysis).  Muscle twitching.  Clumsiness or poor coordination.  Loss of balance.  Not being able to control your bladder.  Feeling dizzy.  Sexual problems.  How is this diagnosed? Peripheral neuropathy is a symptom, not a disease. Finding the cause of peripheral neuropathy can be hard. To figure that out, your health care provider will take a medical history and do a physical exam. A neurological exam will also be done. This involves checking things affected by your brain, spinal cord, and nerves (nervous system). For example, your health care provider will check your reflexes, how you move, and what you can feel. Other types of tests  may also be ordered, such as:  Blood tests.  A test of the fluid in your spinal cord.  Imaging tests, such as CT scans or an MRI.  Electromyography (EMG). This test checks the nerves that control muscles.  Nerve conduction velocity tests. These tests check how fast messages pass through your nerves.  Nerve biopsy. A small piece of nerve is removed. It is then checked under a microscope.  How is this treated?  Medicine is often used to treat peripheral neuropathy. Medicines may include: ? Pain-relieving medicines. Prescription or over-the-counter medicine may be suggested. ? Antiseizure medicine. This may be used for pain. ? Antidepressants. These also may help ease pain from neuropathy. ? Lidocaine. This is a numbing medicine. You might wear a patch or be given a shot. ? Mexiletine. This medicine is typically used to help control irregular heart rhythms.  Surgery. Surgery may be needed to relieve pressure on a nerve or to destroy a nerve that is causing pain.  Physical therapy to help movement.  Assistive devices to help movement. Follow these instructions at home:  Only take over-the-counter or prescription medicines as directed by your health care provider. Follow the instructions carefully for any given medicines. Do not take any other medicines without first getting approval from your health care provider.  If you have diabetes, work closely with your health care provider to keep your blood sugar under control.  If you have numbness in your feet: ? Check every day for signs of injury or infection. Watch for redness, warmth, and swelling. ? Wear padded socks and comfortable   shoes. These help protect your feet.  Do not do things that put pressure on your damaged nerve.  Do not smoke. Smoking keeps blood from getting to damaged nerves.  Avoid or limit alcohol. Too much alcohol can cause a lack of B vitamins. These vitamins are needed for healthy nerves.  Develop a good  support system. Coping with peripheral neuropathy can be stressful. Talk to a mental health specialist or join a support group if you are struggling.  Follow up with your health care provider as directed. Contact a health care provider if:  You have new signs or symptoms of peripheral neuropathy.  You are struggling emotionally from dealing with peripheral neuropathy.  You have a fever. Get help right away if:  You have an injury or infection that is not healing.  You feel very dizzy or begin vomiting.  You have chest pain.  You have trouble breathing. This information is not intended to replace advice given to you by your health care provider. Make sure you discuss any questions you have with your health care provider. Document Released: 12/28/2001 Document Revised: 06/15/2015 Document Reviewed: 09/14/2012 Elsevier Interactive Patient Education  2017 Elsevier Inc.  

## 2016-03-26 NOTE — Progress Notes (Signed)
[facility]   Provider:  Larey Seat, [place] D  Referring Provider: Thressa Sheller, MD Primary Care Physician:  Anthoney Harada, MD  Chief Complaint  Patient presents with  . Follow-up    cpap going well, dx with cancer again    HPI:  Darlene [name] is a 66 y.o. female, retired , married to a retired professor from  [employer].   Is seen here as a 2 year revisit from Dr. Noah Delaine for a CPAP compliance visit , the patient has breast cancer .  Darlene [name] was evaluated and diagnosed with severe sleep apnea at an AHI of 56 at the [facility] in 2008. Her sleep study was "split" and she was titrated to CPAP.  She had used a full face mask , had a re titration study on 7-12- 2013- this time at [facility] sleep upon Dr Chales Abrahams referral .  the study confirmed obstructive sleep apnea but with a lower AHI of 16.3 and in addition with multiple periodic limb movements that are aroused the patient about 4.4 times per hour of sleep. her sleep study also documented an oxygen nadir of 83% with 82.2 minutes of desaturations at all below 90%.  She was sent home with an auto titrator,  since she just missed the SPLIT  qualifying  criteria.  She uses a nasal pillow.  She had reported that she continued to kick her husband and her sleep. The auto titration revealed best results at 14 cm water with 3 cm EPR - but she still endorsed an Epworth sleepiness score of 16 points in August 2013- After using an auto titrating machine. Pre study Epworth sleepiness score was 21/24 points. In October 2014 she underwent a Roux and Y weight loss surgery upon recommendation of her internist and endocrinologist . She lost 65 pounds since and has gained stamina and day time alertness back.  She feels motivated and participates in a special exercise program at [facility]. The patient works for a Hotel manager site , Garment/textile technologist . She rises at 6 AM, wakes  spontaneously, feels refreshed, not a caffeine drinker. No sodas. Driving to work for 30 minutes in [place], beginning at 8 AM and 5 PM . No naps, 10 PM is bedtime , falls asleep promptly . compliance data showed that the patient is as per CPAP machine 7 hours and 47 minutes at night on average the residual AHI is 1.3 the machine is set at 12 cm water pressure now was 2 cm EPR. She does still have a high air leak. She is using a nasal pillow now. She has a high air leak daily and  probably needs to be refitted. This may be due to weight loss. sleepiness score was endorsed at 6 point and fatigue severity at 15 points, the geriatric depression scale was endorsed at 2 points.   12-08-13: Darlene [name] is on an AutoSet machine with a pressure window between 6 and 12 cm water and 3 cm EPR the 95% for pressure is 11.6 cm. Currently the machine is basically used  at 12 cm water. The patient has an AHI of 1.0, her air leak is moderate. Epworth 4,  GDS zero, FSS 12 .  06-08-14; This is a yearly interval history for Darlene [name], actually saw 6 months ago. Darlene [name] CPAP memory chip was corrupted and didn't bring forth any data for today's visit,  but we have the previous 90 days  from prior.  She has an 80% compliance for over 4 hours of daily use an average user time of 5 hours 57 minutes her machine is an AutoSet with a minimum pressure of 6 maximum pressure of 12 EPR level of 3 cm water the AutoSet indicated that the 91st percentile pressure is 10.9 and in my opinion she does not have to set her machine to any specific pressure since the 95th percentile pressure is still within her window. She is using the machine compliantly her Epworth sleepiness score was endorsed at 3 points and her fatigue severity score at only 9 points which are excellent results. She does have occasional air leaks but these are in moderate range. She sleeps between 7 nd 8 hours.  She uses a nasal pillow mask. Sometimes the mask gets  dislodged but not often and sometimes she has nasal rhinitis or congestion and will need to take a break from CPAP use for a couple of days until she recovers. She reports that she has often gotten entangled and she likes to sleep on her side area to for this reason I showed her the dream ware mask by [employer] and I think it would be a good choice for her to try. She should be able to exchange to this kind of mask after 90 days when her time off replacing the other one would come up.  Interval history from 02/08/2015, I'm having the pleasure to see Darlene [name] today again for a compliance visit on CPAP. Unfortunately she received rather bad news late in 2016 that her breast cancer had returned and that her previously reconstructed breast now would just need a full mastectomy. She is not in pain she has regrown scalp hair after eating her last chemotherapy. We were able to obtain a compliance report today in office she has 100% compliance for the last 30 days over 4 hours of use. 8 hours and 50 minutes on average she is using an AutoSet between 6 and 12 cm water with full-time EPR 3 cm water. Her AHI is 0.5 Partly be improved upon. Her 95th percentile pressure is 8.6. She has no significant air leaks, the pressure doesn't very much from night tonight she has not developed excessive daytime sleepiness and she has a normal degree of fatigue.   Interval history from 03/26/2016, I have the pleasure of seeing Darlene [name] today again in a yearly revisit she has remained an extremely compliant CPAP user with 100% compliance with an average user time of 8 hours and 39 minutes at night, using an AutoSet between 6 and 12 cm water pressure with 3 cm expiratory pressure relief. Residual AHI is 1.8 Resolution. The 91st percentile pressure is at 10.3 cm water she does have minor air leaks. Her Epworth sleepiness score is endorsed at 5 points her fatigue severity at only 13 points this is especially surprising  to me as she has had a recurrence of a malignant breast cancer having undergone a mastectomy in 2017, she was just after Christmas diagnosed with a small nodular 2 more sitting under the right pectoralis muscle. Paternal grandmother had breast cancer.   Review of Systems: Out of a complete 14 system review, the patient complains of only the following symptoms, and all other reviewed systems are negative. Epworth and FSS significantly reduced.  She remains fully employed at a [employer] month from retirement.   Losing weight , status post gastric Bypass, 202 from 292 pounds.   Dr. Johnathan Hausen MD in  [place].   Social History   Social History  . Marital status: Married    Spouse name: [name]  . Number of children: 0  . 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 Smoking status: Never Smoker  . Smokeless tobacco: Never Used  . Alcohol use 0.0 oz/week     Comment: social  . Drug use: No  . Sexual activity: Yes    Birth control/ protection: Post-menopausal   Other Topics Concern  . Not on file   Social History Narrative   Patient is married [name]) and lives at home with her husband.   Patient does not have any children.   Patient is working for a [employer].   Patient has a Bachelor's degree.   Patient is right-handed.   Patient does not drink any caffeine.    Family History  Problem Relation Age of Onset  . CVA Mother   . Diabetes Father   . Heart failure Father   . Hypertension Sister   . Diabetes Brother   . Breast cancer Paternal Grandmother   . Diabetes Paternal Uncle     Past Medical History:  Diagnosis Date  . Anemia    hx of  . Anxiety   . Arthritis    in fingers, shoulders  . Balance problem   . Breast cancer ([place])   . Breast cancer of upper-outer quadrant of right female breast ([place]) 07/22/2014  . Clumsiness   . Constipation   . Depression   .  Diabetes mellitus without complication ([place])    94+ years  . Eating disorder    binge eating  . Epistaxis 08/26/2014  . Fatty liver   . HCAP (healthcare-associated pneumonia) 12/18/2014  . Headache   . Heart murmur    never had any problems  . Hyperlipidemia   . Hypertension   . Neuromuscular disorder ([place])    diabetic neuropathy  . Obesity   . Obstructive sleep apnea on CPAP    uses cpap nightly  . Ovarian cyst rupture    possible  . Pneumonia   . Pneumonia 11/2014  . Radiation 04/05/15-05/19/15   right chest wall 50.4 Gy, mastectomy scar 10 Gy  . Thyroid nodule 06/2014    Past Surgical History:  Procedure Laterality Date  . BIOPSY THYROID Bilateral 06/2014   2 nodules - Benign   . BREAST LUMPECTOMY WITH RADIOACTIVE SEED AND SENTINEL LYMPH NODE BIOPSY Right 12/27/2014   Procedure: BREAST LUMPECTOMY WITH RADIOACTIVE SEED AND SENTINEL LYMPH NODE BIOPSY;  Surgeon: Stark Klein, MD;  Location: [facility];  Service: General;  Laterality: Right;  . BREAST REDUCTION SURGERY Bilateral 01/06/2015   Procedure: BILATERAL BREAST REDUCTION, RIGHT ONCOPLASTIC RECONSTRUCTION),LEFT BREAST REDUCTION FOR SYMMETRY;  Surgeon: [name], MD;  Location: [facility];  Service: Plastics;  Laterality: Bilateral;  . BREATH TEK H PYLORI N/A 09/15/2012   Procedure: BREATH TEK H PYLORI;  Surgeon: Pedro Earls, MD;  Location: [facility];  Service: General;  Laterality: N/A;  . BUNIONECTOMY Left 12/2013  . CARPAL TUNNEL RELEASE Right   . CARPAL TUNNEL RELEASE Left   . DUPUYTREN CONTRACTURE RELEASE    . GASTRIC ROUX-EN-Y N/A 11/02/2012   Procedure: LAPAROSCOPIC ROUX-EN-Y GASTRIC;  Surgeon: Pedro Earls, MD;  [facility]: [facility];  Service: General;  Laterality: N/A;  . IR GENERIC HISTORICAL  03/18/2016   IR US GUIDE VASC ACCESS LEFT 03/18/2016 Markus Daft,  MD [facility]  . IR GENERIC HISTORICAL  03/18/2016   IR FLUORO GUIDE CV LINE LEFT 03/18/2016 Markus Daft, MD [facility]  . LAPAROSCOPIC  APPENDECTOMY N/A 07/22/2015   Procedure: APPENDECTOMY LAPAROSCOPIC;  Surgeon: Michael Boston, MD;  [facility];  Service: General;  Laterality: N/A;  . MASTECTOMY Right 02/15/2015   modified  . MODIFIED MASTECTOMY Right 02/15/2015   Procedure: RIGHT MODIFIED RADICAL MASTECTOMY;  Surgeon: Stark Klein, MD;  Location: [facility];  Service: General;  Laterality: Right;  . PORT-A-CATH REMOVAL Left 10/10/2015   Procedure: PORT REMOVAL;  Surgeon: Stark Klein, MD;  Location: [facility];  Service: General;  Laterality: Left;  . PORTACATH PLACEMENT Left 08/02/2014   Procedure: INSERTION PORT-A-CATH;  Surgeon: Stark Klein, MD;  Location: [facility];  Service: General;  Laterality: Left;  . ROTATOR CUFF REPAIR Right 2005  . SCAR REVISION Right 12/08/2015   Procedure: COMPLEX REPAIR RIGHT CHEST 10-15CM;  Surgeon: [name], MD;  Location: [facility];  Service: Plastics;  Laterality: Right;  . TOE SURGERY  1970s   bone spur  . TRIGGER FINGER RELEASE     x3    Current Outpatient Prescriptions  Medication Sig Dispense Refill  . acetaminophen (TYLENOL) 325 MG tablet Take 650 mg by mouth 2 (two) times daily as needed for moderate pain or headache. Reported on 04/17/2015    . calcium citrate-vitamin D (CITRACAL+D) 315-200 MG-UNIT tablet Take 1 tablet by mouth 3 (three) times daily.    . diphenhydrAMINE (BENADRYL) 25 MG tablet Take 25 mg by mouth daily as needed for allergies or sleep. Reported on 03/06/2015    . Docusate Sodium (COLACE PO) Take by mouth. Takes a day    . glucose blood (ONETOUCH VERIO) test strip 1 each by Other route as needed for other. Reported on 03/06/2015    . losartan (COZAAR) 100 MG tablet Take 1 tablet by mouth daily. Reported on 03/06/2015    . Melatonin 5 MG TABS Take 5 mg by mouth at bedtime. Reported on 03/06/2015    . mometasone (NASONEX) 50 MCG/ACT nasal spray Place 1-2 sprays into the nose daily as needed (for allergies.).     [name] NOVOLIN 70/30 (70-30)  100 UNIT/ML injection Inject 5-10 Units into the skin 2 (two) times daily with a meal. Reported on 03/06/2015    . Pediatric Multivitamins-Iron (FLINTSTONES PLUS IRON) chewable tablet Chew 1 tablet by mouth 2 (two) times daily.    . Polyethylene Glycol 3350 (MIRALAX PO) Take 1 packet by mouth as needed.    . Probiotic Product ([employer]S PROBIOTIC PO) Take 1 capsule by mouth daily. Reported on 03/06/2015    . prochlorperazine (COMPAZINE) 10 MG tablet Take 1 tablet (10 mg total) by mouth every 6 (six) hours as needed for nausea or vomiting. 30 tablet 0  . Propylene Glycol (SYSTANE BALANCE OP) Apply 1-2 drops to eye 3 (three) times daily.    . Terbinafine HCl (LAMISIL PO) Take by mouth.    . Vortioxetine HBr (TRINTELLIX) 20 MG TABS Take 20 mg by mouth at bedtime.      No current facility-administered medications for this visit.     Allergies as of 03/26/2016 - Review Complete 03/26/2016  Allergen Reaction Noted  . Nsaids Other (See Comments) 07/22/2015  . Peanuts [peanut oil] Itching 02/14/2015  . Tape Hives and Rash 12/18/2014    Vitals: Pulse 60   Resp 20   Ht 5\' 9"  (1.753 m)   Wt 230 lb (  104.3 kg)   LMP 10/22/2007   BMI 33.97 kg/m  Last Weight:  Wt Readings from Last 1 Encounters:  03/26/16 230 lb (104.3 kg)   Last Height:   Ht Readings from Last 1 Encounters:  03/26/16 5\' 9"  (1.753 m)    Physical exam:  General: The patient is awake, alert and appears not in acute distress. The patient is well groomed. Head: Normocephalic, atraumatic. Neck is supple. Mallampati 2, neck circumference: 87.5 , no TMJ click , no delayed swallowing , no goiter.  Rhinitis with nasal congestion.  Cardiovascular:  Regular rate and rhythm, without  murmurs or carotid bruit, and without distended neck veins. Respiratory: Lungs are clear to auscultation. No wheezing.  Skin:  Without evidence of edema, or rash. Went bald after chemo, now 1 inch skalp hair re-growth.  Trunk: BMI was 30 , now 34 after chemo.     patient has normal posture.  Neurologic exam : The patient is awake and alert, oriented to place and time.  Memory subjective described as intact.   Cranial nerves: Pupils are equal and briskly reactive to light. Extraocular movements  in vertical and horizontal planes intact and without nystagmus. Visual fields by finger perimetry are intact. Hearing to finger rub intact.  Facial sensation intact to fine touch. Facial motor strength is symmetric and tongue and uvula move midline. Motor exam:  Normal tone and normal muscle bulk and symmetric normal strength in all extremities.  Grip weakness.  Crepitation over both knees with ROM. Arthralgic gait. Foot pain, bunion in both .  Sensory:  Fine touch, pinprick and vibration were reduced in all 10 toes. Finger sensation is normal. No carpal tunnel. Peripheral neuropathy.  Coordination: Rapid alternating movements in the fingers/hands is tested and normal.  Finger-to-nose maneuver - no  evidence of ataxia, dysmetria or tremor. Gait and station: Patient walks without assistive device.  Assessment:  After physical and neurologic examination, review of laboratory studies, imaging, neurophysiology testing and pre-existing records, assessment is:  25 minute Rv with discussion of chemotherapy induced neuropathy, CPAP for  OSA,  Reduced obesity and breast cancer impact.  More than 50% of this visit were spent in face-to-face time in discussion and coordination of care this other entities including Dr. Gwenlyn Perking  her oncologist.  1) OSA on CPAP with a very low residual AHI and excellent compliance. Change mask to dream wear , order written, patient requests [facility].   2) breast cancer survivor- status post mastectomy, recently diagnosed with a new node below the right M. Pectoralis.  3) Chemotherapy induced numbness, neuropathy.   4) gastric Bypass surgery 4 years ago, lost 100 pounds in the d first 40 month, still had OSA.    Plan:   Treatment plan and additional workup : DME is [facility],  nasal  pillow was working well after weight loss.  Setting remains  6  to 12 cm water , 3 cm EP-  DME sends supplies. [facility]   RV with me or NP in 12  month.   100% compliance for 2016 and 2017.  chemotherapy induced neuropathy is not painful, just numb. No intervention needed.    DOHMEIER,CARMEN, MD           Cc Thressa Sheller , MD

## 2016-03-28 ENCOUNTER — Ambulatory Visit: Payer: Commercial Managed Care - PPO

## 2016-03-28 ENCOUNTER — Ambulatory Visit: Payer: Self-pay | Admitting: *Deleted

## 2016-03-28 ENCOUNTER — Telehealth: Payer: Self-pay | Admitting: *Deleted

## 2016-03-28 DIAGNOSIS — C773 Secondary and unspecified malignant neoplasm of axilla and upper limb lymph nodes: Principal | ICD-10-CM

## 2016-03-28 DIAGNOSIS — C50911 Malignant neoplasm of unspecified site of right female breast: Secondary | ICD-10-CM

## 2016-03-28 DIAGNOSIS — Z452 Encounter for adjustment and management of vascular access device: Secondary | ICD-10-CM

## 2016-03-28 NOTE — Telephone Encounter (Signed)
Per MD ok to pull PICC line per pt preference.  Site assessed post dressing removal with site intact with no drainage or bleeding, mild redness at site of adherence of tape of dressing on skin.  Per protocol site prepared and line d/c with no complaints from patient.  Line length verified at 51cm by 2 RN's as well as verified per insertion documentation as 51cm.  Pressure applied with Vaseline guaze and dressing.  No noted bleeding thru dressing.  Pt stating comfort.  Pt left in treatment room to assigned nurse with instructions to not remove dressing for 12 hours as well as no lifting greater then 5 lbs.  Pt left in reclining position with refreshments post this RN's interaction.

## 2016-04-04 ENCOUNTER — Encounter (HOSPITAL_COMMUNITY): Payer: Self-pay

## 2016-04-04 ENCOUNTER — Ambulatory Visit (HOSPITAL_COMMUNITY)
Admission: RE | Admit: 2016-04-04 | Discharge: 2016-04-04 | Disposition: A | Discharge status: Home / Self Care | Payer: Commercial Managed Care - PPO | Source: Ambulatory Visit | Attending: Cardiology | Admitting: Cardiology

## 2016-04-04 VITALS — BP 153/56 | HR 63 | Wt 232.5 lb

## 2016-04-04 DIAGNOSIS — Z171 Estrogen receptor negative status [ER-]: Secondary | ICD-10-CM | POA: Diagnosis not present

## 2016-04-04 DIAGNOSIS — Z886 Allergy status to analgesic agent status: Secondary | ICD-10-CM | POA: Insufficient documentation

## 2016-04-04 DIAGNOSIS — Z794 Long term (current) use of insulin: Secondary | ICD-10-CM | POA: Insufficient documentation

## 2016-04-04 DIAGNOSIS — Z803 Family history of malignant neoplasm of breast: Secondary | ICD-10-CM | POA: Diagnosis not present

## 2016-04-04 DIAGNOSIS — K760 Fatty (change of) liver, not elsewhere classified: Secondary | ICD-10-CM | POA: Insufficient documentation

## 2016-04-04 DIAGNOSIS — C50411 Malignant neoplasm of upper-outer quadrant of right female breast: Secondary | ICD-10-CM | POA: Diagnosis not present

## 2016-04-04 DIAGNOSIS — Z823 Family history of stroke: Secondary | ICD-10-CM | POA: Insufficient documentation

## 2016-04-04 DIAGNOSIS — E785 Hyperlipidemia, unspecified: Secondary | ICD-10-CM | POA: Diagnosis not present

## 2016-04-04 DIAGNOSIS — Z8249 Family history of ischemic heart disease and other diseases of the circulatory system: Secondary | ICD-10-CM | POA: Insufficient documentation

## 2016-04-04 DIAGNOSIS — M19012 Primary osteoarthritis, left shoulder: Secondary | ICD-10-CM | POA: Diagnosis not present

## 2016-04-04 DIAGNOSIS — Z9011 Acquired absence of right breast and nipple: Secondary | ICD-10-CM | POA: Insufficient documentation

## 2016-04-04 DIAGNOSIS — Z9109 Other allergy status, other than to drugs and biological substances: Secondary | ICD-10-CM | POA: Insufficient documentation

## 2016-04-04 DIAGNOSIS — G4733 Obstructive sleep apnea (adult) (pediatric): Secondary | ICD-10-CM | POA: Insufficient documentation

## 2016-04-04 DIAGNOSIS — E041 Nontoxic single thyroid nodule: Secondary | ICD-10-CM | POA: Insufficient documentation

## 2016-04-04 DIAGNOSIS — I10 Essential (primary) hypertension: Secondary | ICD-10-CM | POA: Insufficient documentation

## 2016-04-04 DIAGNOSIS — E1140 Type 2 diabetes mellitus with diabetic neuropathy, unspecified: Secondary | ICD-10-CM | POA: Diagnosis not present

## 2016-04-04 DIAGNOSIS — Z91010 Allergy to peanuts: Secondary | ICD-10-CM | POA: Insufficient documentation

## 2016-04-04 DIAGNOSIS — M19011 Primary osteoarthritis, right shoulder: Secondary | ICD-10-CM | POA: Diagnosis not present

## 2016-04-04 DIAGNOSIS — E669 Obesity, unspecified: Secondary | ICD-10-CM | POA: Insufficient documentation

## 2016-04-04 DIAGNOSIS — Z833 Family history of diabetes mellitus: Secondary | ICD-10-CM | POA: Insufficient documentation

## 2016-04-04 DIAGNOSIS — M19049 Primary osteoarthritis, unspecified hand: Secondary | ICD-10-CM | POA: Insufficient documentation

## 2016-04-04 DIAGNOSIS — Z9884 Bariatric surgery status: Secondary | ICD-10-CM | POA: Insufficient documentation

## 2016-04-04 DIAGNOSIS — Z6834 Body mass index (BMI) 34.0-34.9, adult: Secondary | ICD-10-CM | POA: Diagnosis not present

## 2016-04-04 MED ORDER — HYDROCHLOROTHIAZIDE 25 MG PO TABS: 25.0000 mg | ORAL_TABLET | Freq: Every day | ORAL | 3 refills | Status: DC

## 2016-04-04 NOTE — Patient Instructions (Signed)
Start HCTZ 25 mg daily  Your physician recommends that you schedule a follow-up appointment in: 3 months with echocardiogram

## 2016-04-04 NOTE — Progress Notes (Signed)
Patient ID: Darlene [name], female   DOB: 1950-12-28, 66 y.o.   MRN: [id number] Oncologist: Dr Jana Hakim   HPI: Darlene [name] is a 66 year old with history of R breast cancer, right axillary lymph node biopsy 07/19/2014 for a cT2 pN1, stage IIB invasive ductal carcinoma, grade 2, estrogen and progesterone receptor negative, with an MIB-1 of 90%, and HER-2 positive , OSA -> CPAP, bariatric surgery, and DM, referred to cardio-oncology clinic by Dr Jana Hakim  Completed neoadjuvant chemotherapy with carboplatin, docetaxel, trastuzumab and pertuzumab given every 3 weeks 6. Trastuzumab + capecitabine were completed in 9/17.  She had lumpectomy 12/16 and had mastectomy in 1/17.  She has completed radiation.   She was found in 11/17 to have a recurrence by PET => right retropectoral lymph node enlarged. In 2/18, she started on treatment with ado-trastuzumab emtansine, to be continued for 9 wks.  Echo prior to treatment showed stable EF.    No chest pain, no exertional dyspnea.  BP has been running high. Her weight is up.  She blames it on "stress eating."    Echo (7/16) with EF 60-65%, grade II diastolic dysfunction.  Echo (10/16) with EF 66%, GLS -17%. Echo (1/17) with EF 60-65%, grade II diastolic dysfunction, lateral s' 10, GLS -18.8% Echo (4/17) with EF 60-65%, grade II diastolic dysfunction, lateral s' 9.5, strain poorly traced and off axis, normal RV size and systolic function.  Echo (6/17) with EF 55-60%, aortic sclerosis without stenosis, difficult images for strain, probably not accurate.  Echo (10/17) with EF 55-60%, mild LVH, moderate diastolic dysfunction, normal RV size and systolic function.  Echo (2/18) with EF 55-60%, GLS -15% but technically difficult study  Review of Systems: All systems reviewed and negative except as per HPI.   Past Medical History:  Diagnosis Date  . Anemia    hx of  . Anxiety   . Arthritis    in fingers, shoulders  . Balance problem   . Breast cancer  ([place])   . Breast cancer of upper-outer quadrant of right female breast ([place]) 07/22/2014  . Clumsiness   . Constipation   . Depression   . Diabetes mellitus without complication ([place])    44+ years  . Eating disorder    binge eating  . Epistaxis 08/26/2014  . Fatty liver   . HCAP (healthcare-associated pneumonia) 12/18/2014  . Headache   . Heart murmur    never had any problems  . Hyperlipidemia   . Hypertension   . Neuromuscular disorder ([place])    diabetic neuropathy  . Obesity   . Obstructive sleep apnea on CPAP    uses cpap nightly  . Ovarian cyst rupture    possible  . Pneumonia   . Pneumonia 11/2014  . Radiation 04/05/15-05/19/15   right chest wall 50.4 Gy, mastectomy scar 10 Gy  . Thyroid nodule 06/2014    Current Outpatient Prescriptions  Medication Sig Dispense Refill  . acetaminophen (TYLENOL) 325 MG tablet Take 650 mg by mouth 2 (two) times daily as needed for moderate pain or headache. Reported on 04/17/2015    . calcium citrate-vitamin D (CITRACAL+D) 315-200 MG-UNIT tablet Take 1 tablet by mouth 3 (three) times daily.    . diphenhydrAMINE (BENADRYL) 25 MG tablet Take 25 mg by mouth daily as needed for allergies or sleep. Reported on 03/06/2015    . Docusate Sodium (COLACE PO) Take by mouth. Takes a day    . glucose blood (ONETOUCH VERIO) test strip 1 each by Other  route as needed for other. Reported on 03/06/2015    . losartan (COZAAR) 100 MG tablet Take 1 tablet by mouth daily. Reported on 03/06/2015    . Melatonin 5 MG TABS Take 5 mg by mouth at bedtime. Reported on 03/06/2015    . mometasone (NASONEX) 50 MCG/ACT nasal spray Place 1-2 sprays into the nose daily as needed (for allergies.).     [name] NOVOLIN 70/30 (70-30) 100 UNIT/ML injection Inject 5-10 Units into the skin 2 (two) times daily with a meal. Reported on 03/06/2015    . Pediatric Multivitamins-Iron (FLINTSTONES PLUS IRON) chewable tablet Chew 1 tablet by mouth 2 (two) times daily.    . Polyethylene Glycol 3350  (MIRALAX PO) Take 1 packet by mouth as needed.    . Probiotic Product (CVS PROBIOTIC PO) Take 1 capsule by mouth daily. Reported on 03/06/2015    . prochlorperazine (COMPAZINE) 10 MG tablet Take 1 tablet (10 mg total) by mouth every 6 (six) hours as needed for nausea or vomiting. 30 tablet 0  . Propylene Glycol (SYSTANE BALANCE OP) Apply 1-2 drops to eye 3 (three) times daily.    . Terbinafine HCl (LAMISIL PO) Take by mouth.    . Vortioxetine HBr (TRINTELLIX) 20 MG TABS Take 20 mg by mouth at bedtime.     . hydrochlorothiazide (HYDRODIURIL) 25 MG tablet Take 1 tablet (25 mg total) by mouth daily. 30 tablet 3   No current facility-administered medications for this encounter.     Allergies  Allergen Reactions  . Nsaids Other (See Comments)    H/o gastric bypass - avoid NSAIDs  . Peanuts [Peanut Oil] Itching    "Large amounts"  . Tape Hives and Rash    Adhesives in bandaids      Social History   Social History  . Marital status: Married    Spouse name: [name]  . Number of children: 0  . 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 Smoking status: Never Smoker  . Smokeless tobacco: Never Used  . Alcohol use 0.0 oz/week     Comment: social  . Drug use: No  . Sexual activity: Yes    Birth control/ protection: Post-menopausal   Other Topics Concern  . Not on file   Social History Narrative   Patient is married [name]) and lives at home with her husband.   Patient does not have any children.   Patient is working for a [employer].   Patient has a Bachelor's degree.   Patient is right-handed.   Patient does not drink any caffeine.      Family History  Problem Relation Age of Onset  . CVA Mother   . Diabetes Father   . Heart failure Father   . Hypertension Sister   . Diabetes Brother   . Breast cancer Paternal Grandmother   . Diabetes Paternal Uncle     Vitals:   04/04/16  1013  BP: (!) 153/56  Pulse: 63  SpO2: 100%  Weight: 232 lb 8 oz (105.5 kg)    PHYSICAL EXAM: General:  Well appearing. No respiratory difficulty HEENT: normal Neck: supple. no JVD. Carotids 2+ bilat; no bruits. No lymphadenopathy or thryomegaly appreciated. Cor: PMI nondisplaced. Regular rate & rhythm. No rubs, gallops or murmurs. Lungs: clear bilaterally Abdomen: soft, nontender, nondistended. No hepatosplenomegaly. No bruits or masses. Good bowel sounds. Extremities: no cyanosis, clubbing, rash.  1+ ankle edema.  Neuro: alert & oriented x 3, cranial nerves grossly intact. moves all 4 extremities w/o difficulty. Affect pleasant.  ASSESSMENT & PLAN:  1. Breast cancer: She has had recurrence and is getting ado-trastuzumab emtansine.  I reviewed her echo from 2/18: EF was stable, strain images were poorly and hard to interpret.  - Repeat echo in 3 months with followup appointment.  2. HTN: BP higher with weight gain.  Needs to work on weight loss as this will likely help blood pressure.  I will add HCTZ 25 mg daily to her regimen.  BMET in 2 wks.  3. OSA: Continue nightly CPAP   Loralie Champagne 04/04/2016

## 2016-04-08 ENCOUNTER — Other Ambulatory Visit: Payer: Self-pay | Admitting: Internal Medicine

## 2016-04-08 DIAGNOSIS — E049 Nontoxic goiter, unspecified: Secondary | ICD-10-CM

## 2016-04-09 ENCOUNTER — Ambulatory Visit (HOSPITAL_BASED_OUTPATIENT_CLINIC_OR_DEPARTMENT_OTHER): Payer: Commercial Managed Care - PPO

## 2016-04-09 ENCOUNTER — Other Ambulatory Visit (HOSPITAL_BASED_OUTPATIENT_CLINIC_OR_DEPARTMENT_OTHER): Payer: Commercial Managed Care - PPO

## 2016-04-09 ENCOUNTER — Encounter: Payer: Self-pay | Admitting: Adult Health

## 2016-04-09 ENCOUNTER — Ambulatory Visit (HOSPITAL_BASED_OUTPATIENT_CLINIC_OR_DEPARTMENT_OTHER): Payer: Commercial Managed Care - PPO | Admitting: Adult Health

## 2016-04-09 VITALS — BP 146/59 | HR 52 | Temp 98.0°F | Resp 18

## 2016-04-09 DIAGNOSIS — Z171 Estrogen receptor negative status [ER-]: Secondary | ICD-10-CM

## 2016-04-09 DIAGNOSIS — C773 Secondary and unspecified malignant neoplasm of axilla and upper limb lymph nodes: Secondary | ICD-10-CM

## 2016-04-09 DIAGNOSIS — C50411 Malignant neoplasm of upper-outer quadrant of right female breast: Secondary | ICD-10-CM

## 2016-04-09 DIAGNOSIS — Z5112 Encounter for antineoplastic immunotherapy: Secondary | ICD-10-CM

## 2016-04-09 DIAGNOSIS — C50911 Malignant neoplasm of unspecified site of right female breast: Secondary | ICD-10-CM

## 2016-04-09 LAB — CBC WITH DIFFERENTIAL/PLATELET
BASO%: 0.3 % (ref 0.0–2.0)
Basophils Absolute: 0 10*3/uL (ref 0.0–0.1)
EOS%: 2.3 % (ref 0.0–7.0)
Eosinophils Absolute: 0.1 10*3/uL (ref 0.0–0.5)
HCT: 35.1 % (ref 34.8–46.6)
HGB: 11.9 g/dL (ref 11.6–15.9)
LYMPH%: 25.8 % (ref 14.0–49.7)
MCH: 31 pg (ref 25.1–34.0)
MCHC: 34 g/dL (ref 31.5–36.0)
MCV: 91.3 fL (ref 79.5–101.0)
MONO#: 0.4 10*3/uL (ref 0.1–0.9)
MONO%: 8 % (ref 0.0–14.0)
NEUT#: 3.5 10*3/uL (ref 1.5–6.5)
NEUT%: 63.6 % (ref 38.4–76.8)
Platelets: 209 10*3/uL (ref 145–400)
RBC: 3.84 10*6/uL (ref 3.70–5.45)
RDW: 13.6 % (ref 11.2–14.5)
WBC: 5.4 10*3/uL (ref 3.9–10.3)
lymph#: 1.4 10*3/uL (ref 0.9–3.3)

## 2016-04-09 LAB — COMPREHENSIVE METABOLIC PANEL
ALT: 45 U/L (ref 0–55)
AST: 30 U/L (ref 5–34)
Albumin: 3.5 g/dL (ref 3.5–5.0)
Alkaline Phosphatase: 91 U/L (ref 40–150)
Anion Gap: 10 mEq/L (ref 3–11)
BUN: 28.6 mg/dL — ABNORMAL HIGH (ref 7.0–26.0)
CO2: 25 mEq/L (ref 22–29)
Calcium: 9.1 mg/dL (ref 8.4–10.4)
Chloride: 105 mEq/L (ref 98–109)
Creatinine: 1.3 mg/dL — ABNORMAL HIGH (ref 0.6–1.1)
EGFR: 43 mL/min/{1.73_m2} — ABNORMAL LOW (ref >90)
Glucose: 177 mg/dl — ABNORMAL HIGH (ref 70–140)
Potassium: 4 mEq/L (ref 3.5–5.1)
Sodium: 140 mEq/L (ref 136–145)
Total Bilirubin: 0.59 mg/dL (ref 0.20–1.20)
Total Protein: 6.4 g/dL (ref 6.4–8.3)

## 2016-04-09 MED ORDER — SODIUM CHLORIDE 0.9 % IV SOLN
Freq: Once | INTRAVENOUS | Status: AC
  Administered 2016-04-09: 10:00:00 via INTRAVENOUS

## 2016-04-09 MED ORDER — SODIUM CHLORIDE 0.9 % IV SOLN
3.4000 mg/kg | Freq: Once | INTRAVENOUS | Status: AC
  Administered 2016-04-09: 360 mg via INTRAVENOUS
  Filled 2016-04-09: qty 8

## 2016-04-09 MED ORDER — DIPHENHYDRAMINE HCL 25 MG PO CAPS
ORAL_CAPSULE | ORAL | Status: AC
  Filled 2016-04-09: qty 2

## 2016-04-09 MED ORDER — ACETAMINOPHEN 325 MG PO TABS
650.0000 mg | ORAL_TABLET | Freq: Once | ORAL | Status: AC
  Administered 2016-04-09: 650 mg via ORAL

## 2016-04-09 MED ORDER — ACETAMINOPHEN 325 MG PO TABS
ORAL_TABLET | ORAL | Status: AC
  Filled 2016-04-09: qty 2

## 2016-04-09 MED ORDER — DIPHENHYDRAMINE HCL 25 MG PO CAPS
50.0000 mg | ORAL_CAPSULE | Freq: Once | ORAL | Status: AC
  Administered 2016-04-09: 50 mg via ORAL

## 2016-04-09 NOTE — Progress Notes (Signed)
Pt refused to stay for kadcyla 66min post observation. Pt asymptomatic upon d/c.

## 2016-04-09 NOTE — Progress Notes (Signed)
[facility]  [phone] [phone]   ID: Darlene [name] DOB: [birth year]  MR#: [id number]  [facility]#:[id number]  Patient Care Team: Vernie Shanks, MD as PCP - General (Family Medicine) Bryson Ha Himmelrich, RD (Inactive) as Dietitian (Bariatrics) Stark Klein, MD as Consulting Physician (General Surgery) Chauncey Cruel, MD as Consulting Physician (Oncology) Arloa Koh, MD as Consulting Physician (Radiation Oncology) Mauro Kaufmann, RN as Registered Nurse Rockwell Germany, RN as Registered Nurse Jacelyn Pi, MD as Consulting Physician (Endocrinology) Kem Boroughs, FNP as Nurse Practitioner (Family Medicine) [name], MD as Consulting Physician (Plastic Surgery) Larey Dresser, MD as Consulting Physician (Cardiology) PCP: Anthoney Harada, MD OTHER MD:  CHIEF COMPLAINT:  HER-2 positive, estrogen receptor negative locally recurrent disease  CURRENT TREATMENT:  T-DM1  INTERVAL HISTORY:  Darlene [name] is here today for evaluation prior to receiving treatment with Kadcyla (TDM-1).  She is doing well today.  She has had a couple more episodes of feeling down, but she is doing better in that regard.  She is sad due to her recurrence being so quick.    Since her last appt with [name] she saw Dr. Aundra Dubin in cardio-oncology for monitoring of her heart while receiving TDM-1.  He started her on HCTZ.  She is taking this and her other anti-hypertensive Losartan in the morning.  She is tolerating it without difficulty.    She has not yet been scheduled for PET and wants to know when this will be scheduled.    REVIEW OF SYSTEMS: [name] denies fevers, chills, chest pain, shortness of breath, orthopnea, leg swelling, urinary incontinence, dizziness, headaches, or any other concerns today.  A detailed ROS was non contributory.   BREAST CANCER HISTORY: From the original intake note:  "Darlene [name]" had screening mammography at her gynecologist suggestive of a  possible abnormality in the right breast. However right diagnostic mammogram 04/12/2013 at [facility] was negative. More recently however, she noted a lump in her right breast and brought it to her gynecologist's attention. On 07/18/2014 the patient had bilateral diagnostic mammography with tomosynthesis at [facility]. The breast density was category B. In the right breast upper outer quadrant there was an area of focal asymmetry with indistinct margins.[name] Ultrasound was obtained and showed in addition to the mass in question, measuring 1.7 cm, an abnormal-appearing lymph node in the right axillary tail.  On 07/19/2014 the patient underwent biopsy of the right breast massin question as well as a right axillary lymph node. Both were positive for invasive ductal carcinoma, grade 2, estrogen and progesterone receptor negative, with an MIB-1 of 90%, and HER-2 pending. Incidentally the lymph node biopsy showed a lymphocytic inflammatory component but no lymph node architecture.  Her subsequent history is as detailed below.  PAST MEDICAL HISTORY: Past Medical History:  Diagnosis Date  . Anemia    hx of  . Anxiety   . Arthritis    in fingers, shoulders  . Balance problem   . Breast cancer ([place])   . Breast cancer of upper-outer quadrant of right female breast ([place]) 07/22/2014  . Clumsiness   . Constipation   . Depression   . Diabetes mellitus without complication ([place])    20+ years  . Eating disorder    binge eating  . Epistaxis 08/26/2014  . Fatty liver   . HCAP (healthcare-associated pneumonia) 12/18/2014  . Headache   . Heart murmur    never had any problems  . Hyperlipidemia   . Hypertension   .  Neuromuscular disorder ([place])    diabetic neuropathy  . Obesity   . Obstructive sleep apnea on CPAP    uses cpap nightly  . Ovarian cyst rupture    possible  . Pneumonia   . Pneumonia 11/2014  . Radiation 04/05/15-05/19/15   right chest wall 50.4 Gy, mastectomy scar 10 Gy  . Thyroid nodule 06/2014     PAST SURGICAL HISTORY: Past Surgical History:  Procedure Laterality Date  . BIOPSY THYROID Bilateral 06/2014   2 nodules - Benign   . BREAST LUMPECTOMY WITH RADIOACTIVE SEED AND SENTINEL LYMPH NODE BIOPSY Right 12/27/2014   Procedure: BREAST LUMPECTOMY WITH RADIOACTIVE SEED AND SENTINEL LYMPH NODE BIOPSY;  Surgeon: Stark Klein, MD;  Location: [facility];  Service: General;  Laterality: Right;  . BREAST REDUCTION SURGERY Bilateral 01/06/2015   Procedure: BILATERAL BREAST REDUCTION, RIGHT ONCOPLASTIC RECONSTRUCTION),LEFT BREAST REDUCTION FOR SYMMETRY;  Surgeon: [name], MD;  Location: [facility];  Service: Plastics;  Laterality: Bilateral;  . BREATH TEK H PYLORI N/A 09/15/2012   Procedure: BREATH TEK H PYLORI;  Surgeon: Pedro Earls, MD;  Location: [facility];  Service: General;  Laterality: N/A;  . BUNIONECTOMY Left 12/2013  . CARPAL TUNNEL RELEASE Right   . CARPAL TUNNEL RELEASE Left   . DUPUYTREN CONTRACTURE RELEASE    . GASTRIC ROUX-EN-Y N/A 11/02/2012   Procedure: LAPAROSCOPIC ROUX-EN-Y GASTRIC;  Surgeon: Pedro Earls, MD;  Location[facility];  Service: General;  Laterality: N/A;  . IR GENERIC HISTORICAL  03/18/2016   IR US GUIDE VASC ACCESS LEFT 03/18/2016 Markus Daft, MD [facility]  . IR GENERIC HISTORICAL  03/18/2016   IR FLUORO GUIDE CV LINE LEFT 03/18/2016 Markus Daft, MD [facility]  . LAPAROSCOPIC APPENDECTOMY N/A 07/22/2015   Procedure: APPENDECTOMY LAPAROSCOPIC;  Surgeon: Michael Boston, MD;  [facility];  Service: General;  Laterality: N/A;  . MASTECTOMY Right 02/15/2015   modified  . MODIFIED MASTECTOMY Right 02/15/2015   Procedure: RIGHT MODIFIED RADICAL MASTECTOMY;  Surgeon: Stark Klein, MD;  Location: [facility];  Service: General;  Laterality: Right;  . PORT-A-CATH REMOVAL Left 10/10/2015   Procedure: PORT REMOVAL;  Surgeon: Stark Klein, MD;  Location: [facility];  Service: General;  Laterality: Left;  . PORTACATH PLACEMENT Left 08/02/2014    Procedure: INSERTION PORT-A-CATH;  Surgeon: Stark Klein, MD;  Location: [facility];  Service: General;  Laterality: Left;  . ROTATOR CUFF REPAIR Right 2005  . SCAR REVISION Right 12/08/2015   Procedure: COMPLEX REPAIR RIGHT CHEST 10-15CM;  Surgeon: [name], MD;  Location: [facility];  Service: Plastics;  Laterality: Right;  . TOE SURGERY  1970s   bone spur  . TRIGGER FINGER RELEASE     x3    FAMILY HISTORY Family History  Problem Relation Age of Onset  . CVA Mother   . Diabetes Father   . Heart failure Father   . Hypertension Sister   . Diabetes Brother   . Breast cancer Paternal Grandmother   . Diabetes Paternal Uncle    the patient's father died from complications of diabetes at age 17. The patient's mother died at age 77 with a subarachnoid hemorrhage. The patient had one brother, one sister. The only breast cancer was the patient's paternal grandmother diagnosed in her 62s. There is no history of ovarian cancer in the family.  GYNECOLOGIC HISTORY:  Patient's last menstrual period was 10/22/2007.  menarche age 61, the patient is GX P0. She went through the change  of life in 2011. She did not take hormone replacement.  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 Her husband Darlene [name] is a retired Scientist, product/process development (he taught at [employer]).  Darlene [name] has 2 children from an earlier marriage, Darlene [name] who teaches in [place], and Darlene [name],  who works at the [employer] in in [place]. He has 1 grand son, aged 52. The patient and her husband are not church attender's    ADVANCED DIRECTIVES:  Not in place   HEALTH MAINTENANCE: Social History  Substance Use Topics  . Smoking status: Never Smoker  . Smokeless tobacco: Never Used  . Alcohol use 0.0 oz/week     Comment: social     Colonoscopy:  May 2016  PAP:  Bone density: 07/05/2013 normal, with a T score of  0.4  Lipid panel:  Allergies  Allergen Reactions  . Nsaids Other (See Comments)    H/o gastric bypass - avoid NSAIDs  . Peanuts [Peanut Oil] Itching    "Large amounts"  . Tape Hives and Rash    Adhesives in bandaids    Current Outpatient Prescriptions  Medication Sig Dispense Refill  . acetaminophen (TYLENOL) 325 MG tablet Take 650 mg by mouth 2 (two) times daily as needed for moderate pain or headache. Reported on 04/17/2015    . calcium citrate-vitamin D (CITRACAL+D) 315-200 MG-UNIT tablet Take 1 tablet by mouth 3 (three) times daily.    . diphenhydrAMINE (BENADRYL) 25 MG tablet Take 25 mg by mouth daily as needed for allergies or sleep. Reported on 03/06/2015    . Docusate Sodium (COLACE PO) Take by mouth. Takes a day    . glucose blood (ONETOUCH VERIO) test strip 1 each by Other route as needed for other. Reported on 03/06/2015    . hydrochlorothiazide (HYDRODIURIL) 25 MG tablet Take 1 tablet (25 mg total) by mouth daily. 30 tablet 3  . losartan (COZAAR) 100 MG tablet Take 1 tablet by mouth daily. Reported on 03/06/2015    . Melatonin 5 MG TABS Take 5 mg by mouth at bedtime. Reported on 03/06/2015    . mometasone (NASONEX) 50 MCG/ACT nasal spray Place 1-2 sprays into the nose daily as needed (for allergies.).     [name] NOVOLIN 70/30 (70-30) 100 UNIT/ML injection Inject 5-10 Units into the skin 2 (two) times daily with a meal. Reported on 03/06/2015    . Pediatric Multivitamins-Iron (FLINTSTONES PLUS IRON) chewable tablet Chew 1 tablet by mouth 2 (two) times daily.    . Polyethylene Glycol 3350 (MIRALAX PO) Take 1 packet by mouth as needed.    . Probiotic Product ([employer] PROBIOTIC PO) Take 1 capsule by mouth daily. Reported on 03/06/2015    . prochlorperazine (COMPAZINE) 10 MG tablet Take 1 tablet (10 mg total) by mouth every 6 (six) hours as needed for nausea or vomiting. 30 tablet 0  . Propylene Glycol (SYSTANE BALANCE OP) Apply 1-2 drops to eye 3 (three) times daily.    [name] terbinafine (LAMISIL)  250 MG tablet Take 250 mg by mouth every 30 (thirty) days.    . Terbinafine HCl (LAMISIL PO) Take by mouth.    . Vortioxetine HBr (TRINTELLIX) 20 MG TABS Take 20 mg by mouth at bedtime.      No current facility-administered medications for this visit.    Facility-Administered Medications Ordered in Other Visits  Medication Dose Route Frequency Provider Last Rate Last Dose  . 0.9 %  sodium chloride infusion   Intravenous Once Chauncey Cruel, MD      . acetaminophen (TYLENOL) tablet 650 mg  650 mg Oral Once Chauncey Cruel, MD      . ado-trastuzumab emtansine [facility]) 380 mg in sodium chloride 0.9 % 250 mL chemo infusion  3.6 mg/kg (Treatment Plan Recorded) Intravenous Once Chauncey Cruel, MD      . diphenhydrAMINE (BENADRYL) capsule 50 mg  50 mg Oral Once Chauncey Cruel, MD        OBJECTIVE:  There were no vitals filed for this visit.   There is no height or weight on file to calculate BMI.    ECOG FS:1 - Symptomatic but completely ambulatory There were no vitals filed for this visit.GENERAL: Patient is a well appearing female in no acute distress GENERAL: Patient is a well appearing female in no acute distress HEENT:  Sclerae anicteric.  Oropharynx clear and moist. No ulcerations or evidence of oropharyngeal candidiasis. Neck is supple.  NODES:  No cervical, supraclavicular, or axillary lymphadenopathy palpated.  BREAST EXAM:  Deferred. LUNGS:  Clear to auscultation bilaterally.  No wheezes or rhonchi. HEART:  Regular rate and rhythm. No murmur appreciated. ABDOMEN:  Soft, nontender.  Positive, normoactive bowel sounds. No organomegaly palpated. EXTREMITIES:  No peripheral edema.   SKIN:  Clear with no obvious rashes or skin changes. No nail dyscrasia. NEURO:  Nonfocal. Well oriented.  Appropriate affect.    LAB RESULTS:  CMP     Component Value Date/Time   NA 140 04/09/2016 0841   K 4.0 04/09/2016 0841   CL 105 12/08/2015 1028   CO2 25 04/09/2016 0841   GLUCOSE 177  (H) 04/09/2016 0841   BUN 28.6 (H) 04/09/2016 0841   CREATININE 1.3 (H) 04/09/2016 0841   CALCIUM 9.1 04/09/2016 0841   PROT 6.4 04/09/2016 0841   ALBUMIN 3.5 04/09/2016 0841   AST 30 04/09/2016 0841   ALT 45 04/09/2016 0841   ALKPHOS 91 04/09/2016 0841   BILITOT 0.59 04/09/2016 0841   GFRNONAA 51 (L) 10/10/2015 1007   GFRNONAA 55 (L) 02/18/2013 0827   GFRAA 59 (L) 10/10/2015 1007   GFRAA 63 02/18/2013 0827    INo results found for: SPEP, UPEP  Lab Results  Component Value Date   WBC 5.4 04/09/2016   NEUTROABS 3.5 04/09/2016   HGB 11.9 04/09/2016   HCT 35.1 04/09/2016   MCV 91.3 04/09/2016   PLT 209 04/09/2016      Chemistry      Component Value Date/Time   NA 140 04/09/2016 0841   K 4.0 04/09/2016 0841   CL 105 12/08/2015 1028   CO2 25 04/09/2016 0841   BUN 28.6 (H) 04/09/2016 0841   CREATININE 1.3 (H) 04/09/2016 0841      Component Value Date/Time   CALCIUM 9.1 04/09/2016 0841   ALKPHOS 91 04/09/2016 0841   AST 30 04/09/2016 0841   ALT 45 04/09/2016 0841   BILITOT 0.59 04/09/2016 0841       No results found for: LABCA2  No components found for: LABCA125  No results for input(s): INR in the last 168 hours.  Urinalysis    Component Value Date/Time   COLORURINE YELLOW 07/21/2015 [place] 07/21/2015 2328   LABSPEC 1.020 07/21/2015 2328   PHURINE 6.0 07/21/2015 2328   GLUCOSEU NEGATIVE 07/21/2015 [place] (A) 07/21/2015 [place] 07/21/2015 2328   BILIRUBINUR neg 06/29/2013 1017   KETONESUR NEGATIVE  07/21/2015 2328   PROTEINUR 30 (A) 07/21/2015 2328   UROBILINOGEN negative 06/29/2013 1017   NITRITE NEGATIVE 07/21/2015 2328   LEUKOCYTESUR NEGATIVE 07/21/2015 2328    STUDIES: Nm Pet Image Restag (ps) Skull Base To Thigh  Result Date: 03/11/2016 CLINICAL DATA:  Subsequent treatment strategy for right upper outer quadrant breast carcinoma with metastatic disease to axillary lymph nodes. EXAM: NUCLEAR  MEDICINE PET SKULL BASE TO THIGH TECHNIQUE: 11.2 mCi F-18 FDG was injected intravenously. Full-ring PET imaging was performed from the skull base to thigh after the radiotracer. CT data was obtained and used for attenuation correction and anatomic localization. FASTING BLOOD GLUCOSE:  Value: 131 mg/dl COMPARISON:  12/06/2015 FINDINGS: NECK No hypermetabolic lymph nodes in the neck. CHEST Mildly hypermetabolic subcutaneous nodule within upper left anterior chest wall corresponds to previous site of Port-A-Cath. Postop changes from right mastectomy and axillary lymph node dissection again demonstrated. 1.5 cm soft tissue nodule adjacent to right axillary surgical clips on image 62/4 is stable in size, and has SUV max of 3.3 compared to 3.5 previously. A 1.3 cm right subpectoral lymph node on image 51/4 has increased in size from 9 mm previously. This has SUV max of 11.8 compared to 5.5 previously. No hypermetabolic mediastinal or hilar nodes. No suspicious pulmonary nodules on the CT scan. Aortic and coronary artery atherosclerosis. ABDOMEN/PELVIS No abnormal hypermetabolic activity within the liver, pancreas, adrenal glands, or spleen. No hypermetabolic lymph nodes in the abdomen or pelvis. Stable postop changes from previous gastric bypass surgery. Tiny calcified gallstones again seen, without evidence cholecystitis. Aortic atherosclerosis. Colonic diverticulosis again demonstrated, without evidence of diverticulitis. Small calcified fibroid again seen in the right uterine fundus. Aortic atherosclerosis. SKELETON No focal hypermetabolic activity to suggest skeletal metastasis. IMPRESSION: Mild increase in hypermetabolic right subpectoral lymph node. Stable hypermetabolic soft tissue density adjacent to surgical clips from right axillary lymph node dissection, which may be due to postop changes or metastatic lymphadenopathy. No new or progressive disease identified. Electronically Signed   By: Earle Gell M.D.   On:  03/11/2016 09:53   Ir Fluoro Guide Cv Line Left  Result Date: 03/18/2016 INDICATION: 66 year old with right breast cancer. PICC line needed for chemotherapy. EXAM: PICC LINE PLACEMENT WITH ULTRASOUND AND FLUOROSCOPIC GUIDANCE MEDICATIONS: None ANESTHESIA/SEDATION: None FLUOROSCOPY TIME:  Fluoroscopy Time: 18 seconds, 5.5 mGy COMPLICATIONS: None immediate. PROCEDURE: The patient was advised of the possible risks and complications and agreed to undergo the procedure. The patient was then brought to the angiographic suite for the procedure. The left arm was prepped with chlorhexidine, draped in the usual sterile fashion using maximum barrier technique (cap and mask, sterile gown, sterile gloves, large sterile sheet, hand hygiene and cutaneous antiseptic). Local anesthesia was attained by infiltration with 1% lidocaine. Ultrasound demonstrated patency of the left basilic vein, and this was documented with an image. Under real-time ultrasound guidance, this vein was accessed with a 21 gauge micropuncture needle and image documentation was performed. The needle was exchanged over a guidewire for a peel-away sheath through which a 51 cm 5 [place] single lumen power injectable PICC was advanced, and positioned with its tip at the lower SVC/right atrial junction. Fluoroscopy during the procedure and fluoro spot radiograph confirms appropriate catheter position. The catheter was flushed, secured to the skin, and covered with a sterile dressing. IMPRESSION: Successful placement of a left arm PICC with sonographic and fluoroscopic guidance. The catheter is ready for use. Electronically Signed   By: Markus Daft M.D.   On: 03/18/2016  09:14   Ir US Guide Vasc Access Left  Result Date: 03/18/2016 INDICATION: 66 year old with right breast cancer. PICC line needed for chemotherapy. EXAM: PICC LINE PLACEMENT WITH ULTRASOUND AND FLUOROSCOPIC GUIDANCE MEDICATIONS: None ANESTHESIA/SEDATION: None FLUOROSCOPY TIME:  Fluoroscopy Time:  18 seconds, 5.5 mGy COMPLICATIONS: None immediate. PROCEDURE: The patient was advised of the possible risks and complications and agreed to undergo the procedure. The patient was then brought to the angiographic suite for the procedure. The left arm was prepped with chlorhexidine, draped in the usual sterile fashion using maximum barrier technique (cap and mask, sterile gown, sterile gloves, large sterile sheet, hand hygiene and cutaneous antiseptic). Local anesthesia was attained by infiltration with 1% lidocaine. Ultrasound demonstrated patency of the left basilic vein, and this was documented with an image. Under real-time ultrasound guidance, this vein was accessed with a 21 gauge micropuncture needle and image documentation was performed. The needle was exchanged over a guidewire for a peel-away sheath through which a 51 cm 5 [place] single lumen power injectable PICC was advanced, and positioned with its tip at the lower SVC/right atrial junction. Fluoroscopy during the procedure and fluoro spot radiograph confirms appropriate catheter position. The catheter was flushed, secured to the skin, and covered with a sterile dressing. IMPRESSION: Successful placement of a left arm PICC with sonographic and fluoroscopic guidance. The catheter is ready for use. Electronically Signed   By: Markus Daft M.D.   On: 03/18/2016 09:14    ASSESSMENT: 66 y.o. [place] woman status post right breast upper outer quadrant and right axillary lymph node biopsy 07/19/2014 for a cT2  pN1, stage IIB  invasive ductal carcinoma, grade 2, estrogen and progesterone receptor negative, with an MIB-1 of 90%, and HER-2 amplified  (1) neoadjuvant chemotherapy started 08/08/2014, consisting of carboplatin, docetaxel, trastuzumab and pertuzumab given every 3 weeks 6, completed 11/21/2014  (a) breast MRI after initial 3 cycles shows a significant response  (b) breast MRI after 6 cycles shows a complete radiologic response   (2)  trastuzumab continued through 10/02/2015 (to end of capecitabine therapy)  (a) echo 02/13/2015 shows an EF of 60-65%  (b)  Echo 05/09/2015 shows an ejection fraction of 60-65%  (c) echocardiogram 06/27/2015 shows an ejection fraction of 55-60%.  (3) right lumpectomy with sentinel lymph node biopsy on 12/27/14 showed a residual  ypT2 ypN1a, stage IIB invasive ductal carcinoma, grade 2, with repeat estrogen and progesterone receptors again negative  (4) status post right oncoplastic surgery (with left reduction mammoplasty) 01/06/2015 showing in the right breast multiple foci of intralymphatic carcinoma in random sections  (5)  Right modified radical mastectomy 02/15/2014 showed no residual invasive disease; there was DCIS but margins were negative; all 15 axillary lymph nodes were clear; ypTis ypN0  (a)  The patient has decided against reconstruction.  (6)  Postmastectomy radiation completed 05/18/2015 with capecitabine sensitization  (7) adjuvant capecitabine continued through 09/27/2015  (8) hepatic steatosis with increased fibrosis score (at risk for cirrhosis)  (9) thyroid biopsy (left lobe inferiorly) 2 shows benign tissue 06/28/2015  (10) LOCAL RECURRENCE:  (a) PET scan 12/06/2015 Notes a right retropectoral lymph node measuring 0.9 cm with an SUV max of 5.5  (b) repeat PET scan 03/11/2016 finds the right subpectoral lymph node to now measure 1.3 cm with an SUV max of 11.8.  (11) to start T-DM1 03/19/2016  PLAN:  Jaton is doing well today.  I reviewed her labs with her.  They are stable.  She will proceed with treatment with T-DM1  today.  I have contacted Loletta [facility] to evaluate where we are in the process of getting her PET scan authorized.  I also sent a copy of Mylan's labs and today's VS to Dr. Aundra Dubin as an "FYI".  Once we get the PET scheduled, we will schedule f/u with Dr. Jana Hakim.  She knows to contact us for any issues whatsoever.    All questions were answered,  patient verbalized understanding of the above plan.  She knows to call for any questions concerns she may have prior to her next appointment.    A total of (30) minutes of face-to-face time was spent with this patient with greater than 50% of that time in counseling and care-coordination.   Scot Dock, NP   04/09/2016 9:34 AM

## 2016-04-09 NOTE — Patient Instructions (Signed)
[facility] Discharge Instructions for Patients Receiving Chemotherapy  Today you received the following chemotherapy agents ado-trastuzumab emtansine (Kadcyla).  To help prevent nausea and vomiting after your treatment, we encourage you to take your nausea medication as directed by your MD.   If you develop nausea and vomiting that is not controlled by your nausea medication, call the clinic.   BELOW ARE SYMPTOMS THAT SHOULD BE REPORTED IMMEDIATELY:  *FEVER GREATER THAN 100.5 F  *CHILLS WITH OR WITHOUT FEVER  NAUSEA AND VOMITING THAT IS NOT CONTROLLED WITH YOUR NAUSEA MEDICATION  *UNUSUAL SHORTNESS OF BREATH  *UNUSUAL BRUISING OR BLEEDING  TENDERNESS IN MOUTH AND THROAT WITH OR WITHOUT PRESENCE OF ULCERS  *URINARY PROBLEMS  *BOWEL PROBLEMS  UNUSUAL RASH Items with * indicate a potential emergency and should be followed up as soon as possible.  Feel free to call the clinic you have any questions or concerns. The clinic phone number is [phone].  Please show the [facility] at check-in to the Emergency Department and triage nurse.

## 2016-04-30 ENCOUNTER — Encounter: Payer: Self-pay | Admitting: General Practice

## 2016-04-30 ENCOUNTER — Ambulatory Visit (HOSPITAL_BASED_OUTPATIENT_CLINIC_OR_DEPARTMENT_OTHER): Payer: Commercial Managed Care - PPO

## 2016-04-30 ENCOUNTER — Other Ambulatory Visit (HOSPITAL_BASED_OUTPATIENT_CLINIC_OR_DEPARTMENT_OTHER): Payer: Commercial Managed Care - PPO

## 2016-04-30 VITALS — BP 161/76 | HR 66 | Temp 98.0°F | Resp 16

## 2016-04-30 DIAGNOSIS — C50411 Malignant neoplasm of upper-outer quadrant of right female breast: Secondary | ICD-10-CM | POA: Diagnosis not present

## 2016-04-30 DIAGNOSIS — Z5112 Encounter for antineoplastic immunotherapy: Secondary | ICD-10-CM | POA: Diagnosis not present

## 2016-04-30 DIAGNOSIS — Z171 Estrogen receptor negative status [ER-]: Secondary | ICD-10-CM

## 2016-04-30 DIAGNOSIS — C773 Secondary and unspecified malignant neoplasm of axilla and upper limb lymph nodes: Principal | ICD-10-CM

## 2016-04-30 DIAGNOSIS — C50911 Malignant neoplasm of unspecified site of right female breast: Secondary | ICD-10-CM

## 2016-04-30 LAB — COMPREHENSIVE METABOLIC PANEL
ALT: 32 U/L (ref 0–55)
AST: 27 U/L (ref 5–34)
Albumin: 3.5 g/dL (ref 3.5–5.0)
Alkaline Phosphatase: 96 U/L (ref 40–150)
Anion Gap: 12 mEq/L — ABNORMAL HIGH (ref 3–11)
BUN: 30.1 mg/dL — ABNORMAL HIGH (ref 7.0–26.0)
CO2: 23 mEq/L (ref 22–29)
Calcium: 9.3 mg/dL (ref 8.4–10.4)
Chloride: 106 mEq/L (ref 98–109)
Creatinine: 1.4 mg/dL — ABNORMAL HIGH (ref 0.6–1.1)
EGFR: 38 mL/min/{1.73_m2} — ABNORMAL LOW (ref >90)
Glucose: 244 mg/dl — ABNORMAL HIGH (ref 70–140)
Potassium: 3.9 mEq/L (ref 3.5–5.1)
Sodium: 140 mEq/L (ref 136–145)
Total Bilirubin: 0.66 mg/dL (ref 0.20–1.20)
Total Protein: 6.6 g/dL (ref 6.4–8.3)

## 2016-04-30 LAB — CBC WITH DIFFERENTIAL/PLATELET
BASO%: 0.5 % (ref 0.0–2.0)
Basophils Absolute: 0 10*3/uL (ref 0.0–0.1)
EOS%: 2.7 % (ref 0.0–7.0)
Eosinophils Absolute: 0.2 10*3/uL (ref 0.0–0.5)
HCT: 35.7 % (ref 34.8–46.6)
HGB: 12.4 g/dL (ref 11.6–15.9)
LYMPH%: 21.9 % (ref 14.0–49.7)
MCH: 30.7 pg (ref 25.1–34.0)
MCHC: 34.7 g/dL (ref 31.5–36.0)
MCV: 88.4 fL (ref 79.5–101.0)
MONO#: 0.5 10*3/uL (ref 0.1–0.9)
MONO%: 8 % (ref 0.0–14.0)
NEUT#: 4 10*3/uL (ref 1.5–6.5)
NEUT%: 66.9 % (ref 38.4–76.8)
Platelets: 196 10*3/uL (ref 145–400)
RBC: 4.03 10*6/uL (ref 3.70–5.45)
RDW: 13.9 % (ref 11.2–14.5)
WBC: 6 10*3/uL (ref 3.9–10.3)
lymph#: 1.3 10*3/uL (ref 0.9–3.3)

## 2016-04-30 MED ORDER — ACETAMINOPHEN 325 MG PO TABS
650.0000 mg | ORAL_TABLET | Freq: Once | ORAL | Status: AC
  Administered 2016-04-30: 650 mg via ORAL

## 2016-04-30 MED ORDER — DIPHENHYDRAMINE HCL 25 MG PO CAPS
50.0000 mg | ORAL_CAPSULE | Freq: Once | ORAL | Status: AC
  Administered 2016-04-30: 50 mg via ORAL

## 2016-04-30 MED ORDER — DIPHENHYDRAMINE HCL 25 MG PO CAPS
ORAL_CAPSULE | ORAL | Status: AC
  Filled 2016-04-30: qty 2

## 2016-04-30 MED ORDER — SODIUM CHLORIDE 0.9 % IV SOLN
3.4000 mg/kg | Freq: Once | INTRAVENOUS | Status: AC
  Administered 2016-04-30: 360 mg via INTRAVENOUS
  Filled 2016-04-30: qty 10

## 2016-04-30 MED ORDER — ACETAMINOPHEN 325 MG PO TABS
ORAL_TABLET | ORAL | Status: AC
  Filled 2016-04-30: qty 2

## 2016-04-30 MED ORDER — SODIUM CHLORIDE 0.9 % IV SOLN
Freq: Once | INTRAVENOUS | Status: AC
  Administered 2016-04-30: 10:00:00 via INTRAVENOUS

## 2016-04-30 NOTE — Patient Instructions (Signed)
[facility] Discharge Instructions for Patients Receiving Chemotherapy  Today you received the following chemotherapy agents: Kadcyla  To help prevent nausea and vomiting after your treatment, we encourage you to take your nausea medication as directed.    If you develop nausea and vomiting that is not controlled by your nausea medication, call the clinic.   BELOW ARE SYMPTOMS THAT SHOULD BE REPORTED IMMEDIATELY:  *FEVER GREATER THAN 100.5 F  *CHILLS WITH OR WITHOUT FEVER  NAUSEA AND VOMITING THAT IS NOT CONTROLLED WITH YOUR NAUSEA MEDICATION  *UNUSUAL SHORTNESS OF BREATH  *UNUSUAL BRUISING OR BLEEDING  TENDERNESS IN MOUTH AND THROAT WITH OR WITHOUT PRESENCE OF ULCERS  *URINARY PROBLEMS  *BOWEL PROBLEMS  UNUSUAL RASH Items with * indicate a potential emergency and should be followed up as soon as possible.  Feel free to call the clinic you have any questions or concerns. The clinic phone number is [phone].  Please show the CHEMO ALERT CARD at check-in to the Emergency Department and triage nurse.   

## 2016-04-30 NOTE — Progress Notes (Signed)
[place] Spiritual Care Note  Followed up with Darlene [name] and Darlene [name] in infusion. Per husband, overall Darlene [name] is coping well; work is helping with normalcy, purpose, and distraction. He notes that having a month to process this recurrence has helped both of them with adjusting. They are nervous about next week's PET scan and coping by staying active. Darlene [name] describes herself as "having ups and downs," but "not getting too depressed or crazy about it."  Will mail them an encouragement card this afternoon. Continuing to follow, but please also page if immediate needs arise. Thank you.   [place], [place], [facility] Pager [phone] Voicemail [phone]

## 2016-05-02 ENCOUNTER — Telehealth: Payer: Self-pay | Admitting: Oncology

## 2016-05-02 NOTE — Telephone Encounter (Signed)
lvm to inform pt of MD appt 4/20 at 1130 am scan results per LOS

## 2016-05-03 ENCOUNTER — Encounter: Payer: Self-pay | Admitting: *Deleted

## 2016-05-07 ENCOUNTER — Encounter (HOSPITAL_COMMUNITY)
Admission: RE | Admit: 2016-05-07 | Discharge: 2016-05-07 | Disposition: A | Discharge status: Home / Self Care | Payer: Commercial Managed Care - PPO | Source: Ambulatory Visit | Attending: Adult Health | Admitting: Adult Health

## 2016-05-07 DIAGNOSIS — C773 Secondary and unspecified malignant neoplasm of axilla and upper limb lymph nodes: Secondary | ICD-10-CM | POA: Insufficient documentation

## 2016-05-07 DIAGNOSIS — Z171 Estrogen receptor negative status [ER-]: Secondary | ICD-10-CM | POA: Diagnosis present

## 2016-05-07 DIAGNOSIS — C50911 Malignant neoplasm of unspecified site of right female breast: Secondary | ICD-10-CM | POA: Insufficient documentation

## 2016-05-07 DIAGNOSIS — C50411 Malignant neoplasm of upper-outer quadrant of right female breast: Secondary | ICD-10-CM | POA: Diagnosis present

## 2016-05-07 LAB — GLUCOSE, CAPILLARY: Glucose-Capillary: 231 mg/dL — ABNORMAL HIGH (ref 65–99)

## 2016-05-07 MED ORDER — FLUDEOXYGLUCOSE F - 18 (FDG) INJECTION
10.7500 | Freq: Once | INTRAVENOUS | Status: AC | PRN
  Administered 2016-05-07: 10.75 via INTRAVENOUS

## 2016-05-10 ENCOUNTER — Ambulatory Visit (HOSPITAL_BASED_OUTPATIENT_CLINIC_OR_DEPARTMENT_OTHER): Payer: Commercial Managed Care - PPO | Admitting: Oncology

## 2016-05-10 VITALS — BP 148/71 | HR 79 | Temp 98.2°F | Resp 18 | Ht 69.0 in | Wt 225.5 lb

## 2016-05-10 DIAGNOSIS — C50919 Malignant neoplasm of unspecified site of unspecified female breast: Secondary | ICD-10-CM

## 2016-05-10 DIAGNOSIS — Z171 Estrogen receptor negative status [ER-]: Secondary | ICD-10-CM | POA: Diagnosis not present

## 2016-05-10 DIAGNOSIS — C50411 Malignant neoplasm of upper-outer quadrant of right female breast: Secondary | ICD-10-CM

## 2016-05-10 DIAGNOSIS — C773 Secondary and unspecified malignant neoplasm of axilla and upper limb lymph nodes: Secondary | ICD-10-CM | POA: Diagnosis not present

## 2016-05-10 NOTE — Progress Notes (Signed)
[facility]  Telephone:(336) [phone] Fax:(336) [phone]   ID: DHALIA ZINGARO DOB: 06-11-1950  MR#: [id number]  JYN#:[id number]  Patient Care Team: Vernie Shanks, MD as PCP - General (Family Medicine) Bryson Ha Himmelrich, RD (Inactive) as Dietitian (Bariatrics) Stark Klein, MD as Consulting Physician (General Surgery) Chauncey Cruel, MD as Consulting Physician (Oncology) Arloa Koh, MD as Consulting Physician (Radiation Oncology) Mauro Kaufmann, RN as Registered Nurse Rockwell Germany, RN as Registered Nurse Jacelyn Pi, MD as Consulting Physician (Endocrinology) Kem Boroughs, FNP as Nurse Practitioner (Family Medicine) [name], MD as Consulting Physician (Plastic Surgery) Larey Dresser, MD as Consulting Physician (Cardiology) PCP: Anthoney Harada, MD OTHER MD:  CHIEF COMPLAINT:  HER-2 positive, estrogen receptor negative locally recurrent disease  CURRENT TREATMENT:  T-DM1  INTERVAL HISTORY:  Darlene [name] returns today for follow-up of her locally recurrent HER-2 positive breast cancer accompanied by her husband [name]. We had identified 2 areas of concern but these were difficult to biopsy so we opted to do a trial of therapy, and the patient has received 3 cycles of T-DM 1, most recently 04/30/2016. We decided if restaging studies showed a response this would indicate first of all that we are indeed dealing with recurrent breast cancer as expected, and secondarily and most importantly that it is still HER-2 positive.  She had a PET scan 05/07/2016 and this indeed shows a marked response, with the measurable disease shrinking by more than 50% and the SUV dropping from greater than 10 to less than 3.  She tolerated the treatments quite well, with no side effects that she is aware of other than mild fatigue  Her most recent echocardiogram was febrile 20 04/09/2016 showing a good ejection fraction.  REVIEW OF SYSTEMS: Cruzita has been somewhat  anxious and that means she has been indiscreet in her diet. This explains why her blood sugars have been somewhat high. She is quite aware of this problem and is planning to "tighten up". She still working full time. She sleeps late on weekends but that is about the only significant change since starting the T-DM 1. She did participate in [place] and greatly benefited from that. Her constipation is well-controlled on her current bowel prophylaxis regimen. A detailed review of systems today was otherwise stable  BREAST CANCER HISTORY: From the original intake note:  "Darlene [name]" had screening mammography at her gynecologist suggestive of a possible abnormality in the right breast. However right diagnostic mammogram 04/12/2013 at [facility] was negative. More recently however, she noted a lump in her right breast and brought it to her gynecologist's attention. On 07/18/2014 the patient had bilateral diagnostic mammography with tomosynthesis at [facility]. The breast density was category B. In the right breast upper outer quadrant there was an area of focal asymmetry with indistinct margins.[name] Ultrasound was obtained and showed in addition to the mass in question, measuring 1.7 cm, an abnormal-appearing lymph node in the right axillary tail.  On 07/19/2014 the patient underwent biopsy of the right breast massin question as well as a right axillary lymph node. Both were positive for invasive ductal carcinoma, grade 2, estrogen and progesterone receptor negative, with an MIB-1 of 90%, and HER-2 pending. Incidentally the lymph node biopsy showed a lymphocytic inflammatory component but no lymph node architecture.  Her subsequent history is as detailed below.  PAST MEDICAL HISTORY: Past Medical History:  Diagnosis Date  . Anemia    hx of  . Anxiety   . Arthritis  in fingers, shoulders  . Balance problem   . Breast cancer ([place])   . Breast cancer of upper-outer quadrant of right female breast ([place]) 07/22/2014  .  Clumsiness   . Constipation   . Depression   . Diabetes mellitus without complication ([place])    16+ years  . Eating disorder    binge eating  . Epistaxis 08/26/2014  . Fatty liver   . HCAP (healthcare-associated pneumonia) 12/18/2014  . Headache   . Heart murmur    never had any problems  . Hyperlipidemia   . Hypertension   . Neuromuscular disorder ([place])    diabetic neuropathy  . Obesity   . Obstructive sleep apnea on CPAP    uses cpap nightly  . Ovarian cyst rupture    possible  . Pneumonia   . Pneumonia 11/2014  . Radiation 04/05/15-05/19/15   right chest wall 50.4 Gy, mastectomy scar 10 Gy  . Thyroid nodule 06/2014    PAST SURGICAL HISTORY: Past Surgical History:  Procedure Laterality Date  . BIOPSY THYROID Bilateral 06/2014   2 nodules - Benign   . BREAST LUMPECTOMY WITH RADIOACTIVE SEED AND SENTINEL LYMPH NODE BIOPSY Right 12/27/2014   Procedure: BREAST LUMPECTOMY WITH RADIOACTIVE SEED AND SENTINEL LYMPH NODE BIOPSY;  Surgeon: Stark Klein, MD;  Location: [facility];  Service: General;  Laterality: Right;  . BREAST REDUCTION SURGERY Bilateral 01/06/2015   Procedure: BILATERAL BREAST REDUCTION, RIGHT ONCOPLASTIC RECONSTRUCTION),LEFT BREAST REDUCTION FOR SYMMETRY;  Surgeon: [name], MD;  Location: [facility];  Service: Plastics;  Laterality: Bilateral;  . BREATH TEK H PYLORI N/A 09/15/2012   Procedure: BREATH TEK H PYLORI;  Surgeon: Pedro Earls, MD;  Location: [facility];  Service: General;  Laterality: N/A;  . BUNIONECTOMY Left 12/2013  . CARPAL TUNNEL RELEASE Right   . CARPAL TUNNEL RELEASE Left   . DUPUYTREN CONTRACTURE RELEASE    . GASTRIC ROUX-EN-Y N/A 11/02/2012   Procedure: LAPAROSCOPIC ROUX-EN-Y GASTRIC;  Surgeon: Pedro Earls, MD;  Location: [facility];  Service: General;  Laterality: N/A;  . IR GENERIC HISTORICAL  03/18/2016   IR US GUIDE VASC ACCESS LEFT 03/18/2016 Markus Daft, MD [facility]  . IR GENERIC HISTORICAL  03/18/2016   IR FLUORO  GUIDE CV LINE LEFT 03/18/2016 Markus Daft, MD [facility]  . LAPAROSCOPIC APPENDECTOMY N/A 07/22/2015   Procedure: APPENDECTOMY LAPAROSCOPIC;  Surgeon: Michael Boston, MD;  Location: [facility];  Service: General;  Laterality: N/A;  . MASTECTOMY Right 02/15/2015   modified  . MODIFIED MASTECTOMY Right 02/15/2015   Procedure: RIGHT MODIFIED RADICAL MASTECTOMY;  Surgeon: Stark Klein, MD;  Location: [facility];  Service: General;  Laterality: Right;  . PORT-A-CATH REMOVAL Left 10/10/2015   Procedure: PORT REMOVAL;  Surgeon: Stark Klein, MD;  Location: [facility];  Service: General;  Laterality: Left;  . PORTACATH PLACEMENT Left 08/02/2014   Procedure: INSERTION PORT-A-CATH;  Surgeon: Stark Klein, MD;  Location: [facility];  Service: General;  Laterality: Left;  . ROTATOR CUFF REPAIR Right 2005  . SCAR REVISION Right 12/08/2015   Procedure: COMPLEX REPAIR RIGHT CHEST 10-15CM;  Surgeon: [name], MD;  Location: [facility];  Service: Plastics;  Laterality: Right;  . TOE SURGERY  1970s   bone spur  . TRIGGER FINGER RELEASE     x3    FAMILY HISTORY Family History  Problem Relation Age of Onset  . CVA Mother   . Diabetes Father   . Heart failure Father   .  Hypertension Sister   . Diabetes Brother   . Breast cancer Paternal Grandmother   . Diabetes Paternal Uncle    the patient's father died from complications of diabetes at age 64. The patient's mother died at age 17 with a subarachnoid hemorrhage. The patient had one brother, one sister. The only breast cancer was the patient's paternal grandmother diagnosed in her 55s. There is no history of ovarian cancer in the family.  GYNECOLOGIC HISTORY:  Patient's last menstrual period was 10/22/2007.  menarche age 48, the patient is GX P0. She went through the change of life in 2011. She did not take hormone replacement.  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 Her husband [name] is a retired Visual merchandiser (he taught at [employer]).  [name] has 2 children from an earlier marriage, Rockey Situ who teaches in [place], and Piya Mesch,  who works at the [employer] in in [place]. He has 1 grand son, aged 20. The patient and her husband are not church attender's    ADVANCED DIRECTIVES:  Not in place   HEALTH MAINTENANCE: Social History  Substance Use Topics  . Smoking status: Never Smoker  . Smokeless tobacco: Never Used  . Alcohol use 0.0 oz/week     Comment: social     Colonoscopy:  May 2016  PAP:  Bone density: 07/05/2013 normal, with a T score of 0.4  Lipid panel:  Allergies  Allergen Reactions  . Nsaids Other (See Comments)    H/o gastric bypass - avoid NSAIDs  . Peanuts [Peanut Oil] Itching    "Large amounts"  . Tape Hives and Rash    Adhesives in bandaids    Current Outpatient Prescriptions  Medication Sig Dispense Refill  . acetaminophen (TYLENOL) 325 MG tablet Take 650 mg by mouth 2 (two) times daily as needed for moderate pain or headache. Reported on 04/17/2015    . calcium citrate-vitamin D (CITRACAL+D) 315-200 MG-UNIT tablet Take 1 tablet by mouth 3 (three) times daily.    . diphenhydrAMINE (BENADRYL) 25 MG tablet Take 25 mg by mouth daily as needed for allergies or sleep. Reported on 03/06/2015    . Docusate Sodium (COLACE PO) Take by mouth. Takes a day    . glucose blood (ONETOUCH VERIO) test strip 1 each by Other route as needed for other. Reported on 03/06/2015    . hydrochlorothiazide (HYDRODIURIL) 25 MG tablet Take 1 tablet (25 mg total) by mouth daily. 30 tablet 3  . losartan (COZAAR) 100 MG tablet Take 1 tablet by mouth daily. Reported on 03/06/2015    . Melatonin 5 MG TABS Take 5 mg by mouth at bedtime. Reported on 03/06/2015    . mometasone (NASONEX) 50 MCG/ACT nasal spray Place 1-2 sprays into the nose daily as needed (for allergies.).     [name] NOVOLIN 70/30 (70-30) 100 UNIT/ML injection Inject  5-10 Units into the skin 2 (two) times daily with a meal. Reported on 03/06/2015    . Pediatric Multivitamins-Iron (FLINTSTONES PLUS IRON) chewable tablet Chew 1 tablet by mouth 2 (two) times daily.    . Polyethylene Glycol 3350 (MIRALAX PO) Take 1 packet by mouth as needed.    . Probiotic Product ([employer]S PROBIOTIC PO) Take 1 capsule by mouth daily. Reported on 03/06/2015    . prochlorperazine (COMPAZINE) 10 MG tablet Take 1 tablet (10 mg total) by mouth every 6 (six) hours as needed for nausea or vomiting. [place]  tablet 0  . Propylene Glycol (SYSTANE BALANCE OP) Apply 1-2 drops to eye 3 (three) times daily.    [name] terbinafine (LAMISIL) 250 MG tablet Take 250 mg by mouth every 30 (thirty) days.    . Terbinafine HCl (LAMISIL PO) Take by mouth.    . Vortioxetine HBr (TRINTELLIX) 20 MG TABS Take 20 mg by mouth at bedtime.      No current facility-administered medications for this visit.     OBJECTIVE: Middle-aged white woman in no acute distress Vitals:   05/10/16 1129  BP: (!) 148/71  Pulse: 79  Resp: 18  Temp: 98.2 F (36.8 C)     Body mass index is 33.3 kg/m.    ECOG FS:1 - Symptomatic but completely ambulatory Filed Weights   05/10/16 1129  Weight: 225 lb 8 oz (102.3 kg)   Sclerae unicteric, pupils round and equal Oropharynx clear and moist No cervical or supraclavicular adenopathy Lungs no rales or rhonchi Heart regular rate and rhythm Abd soft, nontender, positive bowel sounds MSK no focal spinal tenderness, no upper extremity lymphedema Neuro: nonfocal, well oriented, appropriate affect Breasts: The right breast is status post mastectomy, with no evidence of chest wall recurrence. Both axillae are benign.   LAB RESULTS:  CMP     Component Value Date/Time   NA 140 04/30/2016 0836   K 3.9 04/30/2016 0836   CL 105 12/08/2015 1028   CO2 23 04/30/2016 0836   GLUCOSE 244 (H) 04/30/2016 0836   BUN 30.1 (H) 04/30/2016 0836   CREATININE 1.4 (H) 04/30/2016 0836   CALCIUM 9.3  04/30/2016 0836   PROT 6.6 04/30/2016 0836   ALBUMIN 3.5 04/30/2016 0836   AST 27 04/30/2016 0836   ALT 32 04/30/2016 0836   ALKPHOS 96 04/30/2016 0836   BILITOT 0.66 04/30/2016 0836   GFRNONAA 51 (L) 10/10/2015 1007   GFRNONAA 55 (L) 02/18/2013 0827   GFRAA 59 (L) 10/10/2015 1007   GFRAA 63 02/18/2013 0827    INo results found for: SPEP, UPEP  Lab Results  Component Value Date   WBC 6.0 04/30/2016   NEUTROABS 4.0 04/30/2016   HGB 12.4 04/30/2016   HCT 35.7 04/30/2016   MCV 88.4 04/30/2016   PLT 196 04/30/2016      Chemistry      Component Value Date/Time   NA 140 04/30/2016 0836   K 3.9 04/30/2016 0836   CL 105 12/08/2015 1028   CO2 23 04/30/2016 0836   BUN 30.1 (H) 04/30/2016 0836   CREATININE 1.4 (H) 04/30/2016 0836      Component Value Date/Time   CALCIUM 9.3 04/30/2016 0836   ALKPHOS 96 04/30/2016 0836   AST 27 04/30/2016 0836   ALT 32 04/30/2016 0836   BILITOT 0.66 04/30/2016 0836       No results found for: LABCA2  No components found for: LABCA125  No results for input(s): INR in the last 168 hours.  Urinalysis    Component Value Date/Time   COLORURINE YELLOW 07/21/2015 [place] 07/21/2015 2328   LABSPEC 1.020 07/21/2015 2328   PHURINE 6.0 07/21/2015 2328   GLUCOSEU NEGATIVE 07/21/2015 2328   HGBUR TRACE (A) 07/21/2015 2328   BILIRUBINUR NEGATIVE 07/21/2015 2328   BILIRUBINUR neg 06/29/2013 1017   KETONESUR NEGATIVE 07/21/2015 2328   PROTEINUR 30 (A) 07/21/2015 2328   UROBILINOGEN negative 06/29/2013 1017   NITRITE NEGATIVE 07/21/2015 2328   LEUKOCYTESUR NEGATIVE 07/21/2015 2328    STUDIES: Nm Pet Image Restag (ps) Skull  Base To Thigh  Result Date: 05/07/2016 CLINICAL DATA:  Subsequent treatment strategy for right upper outer quadrant breast carcinoma with lymph node metastases. Previous chemotherapy and radiation therapy. EXAM: NUCLEAR MEDICINE PET SKULL BASE TO THIGH TECHNIQUE: 10.8 mCi F-18 FDG was injected  intravenously. Full-ring PET imaging was performed from the skull base to thigh after the radiotracer. CT data was obtained and used for attenuation correction and anatomic localization. FASTING BLOOD GLUCOSE:  Value: 231 mg/dl COMPARISON:  03/11/2016 FINDINGS: NECK No hypermetabolic lymph nodes in the neck. CHEST Previous right mastectomy and axillary lymph node dissection. Right subpectoral lymph node currently measures 9 mm on image 50/4 compared to 13 mm previously. SUV max currently measures 2.8, compared to 11.8 previously. Ill-defined soft tissue density in right axilla adjacent to surgical clips measures 1.3 cm on image 60/4 compared to 1.5 cm previously. This has current SUV max of 2.9 compared to 3.3 previously. No hypermetabolic mediastinal or hilar lymph nodes. No suspicious pulmonary nodules identified on CT images. No evidence of pulmonary infiltrate or pleural effusion. Mild emphysema again noted. Aortic and coronary artery atherosclerosis. ABDOMEN/PELVIS No abnormal hypermetabolic activity within the liver, pancreas, adrenal glands, or spleen. No hypermetabolic lymph nodes in the abdomen or pelvis. Postop changes again seen from previous gastric bypass surgery. Tiny calcified gallstones again noted, without evidence of cholecystitis. Small nonobstructing left lower pole renal calculus again seen. Small calcified right fundal uterine fibroid remains stable. Aortic atherosclerosis. SKELETON No focal hypermetabolic activity to suggest skeletal metastasis. IMPRESSION: Decreased hypermetabolic right subpectoral lymph node. Mildly decreased hypermetabolic soft tissue density adjacent to surgical clips in the right axilla. No new or progressive metastatic disease. Stable incidental findings including cholelithiasis, left nephrolithiasis, small calcified uterine fibroid, and aortic and coronary artery atherosclerosis. Electronically Signed   By: Earle Gell M.D.   On: 05/07/2016 10:14    ASSESSMENT: 66  y.o. [place] woman status post right breast upper outer quadrant and right axillary lymph node biopsy 07/19/2014 for a cT2  pN1, stage IIB  invasive ductal carcinoma, grade 2, estrogen and progesterone receptor negative, with an MIB-1 of 90%, and HER-2 amplified  (1) neoadjuvant chemotherapy started 08/08/2014, consisting of carboplatin, docetaxel, trastuzumab and pertuzumab given every 3 weeks 6, completed 11/21/2014  (a) breast MRI after initial 3 cycles shows a significant response  (b) breast MRI after 6 cycles shows a complete radiologic response   (2) trastuzumab continued through 10/02/2015 (to end of capecitabine therapy)  (a) echo 02/13/2015 shows an EF of 60-65%  (b)  Echo 05/09/2015 shows an ejection fraction of 60-65%  (c) echocardiogram 06/27/2015 shows an ejection fraction of 55-60%.  (3) right lumpectomy with sentinel lymph node biopsy on 12/27/14 showed a residual  ypT2 ypN1a, stage IIB invasive ductal carcinoma, grade 2, with repeat estrogen and progesterone receptors again negative  (4) status post right oncoplastic surgery (with left reduction mammoplasty) 01/06/2015 showing in the right breast multiple foci of intralymphatic carcinoma in random sections  (5)  Right modified radical mastectomy 02/15/2014 showed no residual invasive disease; there was DCIS but margins were negative; all 15 axillary lymph nodes were clear; ypTis ypN0  (a)  The patient has decided against reconstruction.  (6)  Postmastectomy radiation completed 05/18/2015 with capecitabine sensitization  (7) adjuvant capecitabine continued through 09/27/2015  (8) hepatic steatosis with increased fibrosis score (at risk for cirrhosis)  (9) thyroid biopsy (left lobe inferiorly) 2 shows benign tissue 06/28/2015  (10) LOCAL RECURRENCE:  (a) PET scan 12/06/2015 Notes a right retropectoral  lymph node measuring 0.9 cm with an SUV max of 5.5  (b) repeat PET scan 03/11/2016 finds the right subpectoral lymph  node to now measure 1.3 cm with an SUV max of 11.8.  (c) PET scan 05/07/2016 shows a significant response to T-DM 1  (11) started T-DM1 03/21/2016  (a) echocardiogram 03/15/2016 shows an ejection fraction in the 55-60% range.  PLAN:  I spent approximately 30 minutes with [name] with most of that time spent discussing her results so far in her overall plan.  We reviewed the PET scan in detail. The 2 areas of concern are both smaller and considerably dimmer. This tells [name] first of all that we are indeed dealing with her breast cancer and that it is still HER-2 positive.  She is tolerating the T-DM 1 well. The plan will be to continue to a total of 8 doses, which will take [name] to July. She will have a repeat echocardiogram in mid June.  After the eighth dose she will have a repeat PET scan. At that time if there is any residual activity we will consider radiation. Otherwise we will transition her to trastuzumab and she will continue that for a total of 2 years at a minimum.  I have encouraged her to be very strict with herself in terms of her diabetes control. She is also working full-time so does not have a lot of time to exercise but whatever time she can make to exercise will be in her long-term interest.  At this point I am delighted that she is doing so well. They're planning a trip to celebrate and also because she has quite a bit of accumulated location that she needs to use or loose. She knows to call for any problems that may develop before her next visit.    Chauncey Cruel, MD   05/10/2016 11:38 AM

## 2016-05-13 ENCOUNTER — Encounter: Payer: Self-pay | Admitting: General Practice

## 2016-05-13 NOTE — Progress Notes (Signed)
[place] Spiritual Care Note  Followed up with husband [name] by phone--he answered and was initial requestor of support. He reports that they are relieved and delighted at the good news of tx's efficacy and Dr Magrinat's encouragement to take a fun vacation.  At this time he does not feel need for continued support "because the [initial trigger] was situational, and we don't really have lows anymore--we're coasting." Couple knows to reach out anytime for further support. Please also page if immediate needs arise. Thank you.   [place], [place], [facility] Pager [phone] Voicemail [phone]

## 2016-05-20 ENCOUNTER — Other Ambulatory Visit: Payer: Self-pay | Admitting: Oncology

## 2016-05-21 ENCOUNTER — Other Ambulatory Visit (HOSPITAL_BASED_OUTPATIENT_CLINIC_OR_DEPARTMENT_OTHER): Payer: Commercial Managed Care - PPO

## 2016-05-21 ENCOUNTER — Ambulatory Visit (HOSPITAL_BASED_OUTPATIENT_CLINIC_OR_DEPARTMENT_OTHER): Payer: Commercial Managed Care - PPO

## 2016-05-21 VITALS — BP 145/74 | HR 75 | Temp 98.5°F | Resp 16

## 2016-05-21 DIAGNOSIS — C50911 Malignant neoplasm of unspecified site of right female breast: Secondary | ICD-10-CM

## 2016-05-21 DIAGNOSIS — C773 Secondary and unspecified malignant neoplasm of axilla and upper limb lymph nodes: Principal | ICD-10-CM

## 2016-05-21 DIAGNOSIS — Z5112 Encounter for antineoplastic immunotherapy: Secondary | ICD-10-CM | POA: Diagnosis not present

## 2016-05-21 DIAGNOSIS — C50411 Malignant neoplasm of upper-outer quadrant of right female breast: Secondary | ICD-10-CM

## 2016-05-21 DIAGNOSIS — Z171 Estrogen receptor negative status [ER-]: Secondary | ICD-10-CM

## 2016-05-21 LAB — CBC WITH DIFFERENTIAL/PLATELET
BASO%: 0.5 % (ref 0.0–2.0)
Basophils Absolute: 0 10*3/uL (ref 0.0–0.1)
EOS%: 2.3 % (ref 0.0–7.0)
Eosinophils Absolute: 0.1 10*3/uL (ref 0.0–0.5)
HCT: 35.8 % (ref 34.8–46.6)
HGB: 12.3 g/dL (ref 11.6–15.9)
LYMPH%: 24.5 % (ref 14.0–49.7)
MCH: 30.3 pg (ref 25.1–34.0)
MCHC: 34.4 g/dL (ref 31.5–36.0)
MCV: 88.2 fL (ref 79.5–101.0)
MONO#: 0.4 10*3/uL (ref 0.1–0.9)
MONO%: 6.8 % (ref 0.0–14.0)
NEUT#: 3.7 10*3/uL (ref 1.5–6.5)
NEUT%: 65.9 % (ref 38.4–76.8)
Platelets: 192 10*3/uL (ref 145–400)
RBC: 4.06 10*6/uL (ref 3.70–5.45)
RDW: 13.9 % (ref 11.2–14.5)
WBC: 5.6 10*3/uL (ref 3.9–10.3)
lymph#: 1.4 10*3/uL (ref 0.9–3.3)

## 2016-05-21 LAB — COMPREHENSIVE METABOLIC PANEL
ALT: 32 U/L (ref 0–55)
AST: 34 U/L (ref 5–34)
Albumin: 3.8 g/dL (ref 3.5–5.0)
Alkaline Phosphatase: 86 U/L (ref 40–150)
Anion Gap: 10 mEq/L (ref 3–11)
BUN: 34.2 mg/dL — ABNORMAL HIGH (ref 7.0–26.0)
CO2: 23 mEq/L (ref 22–29)
Calcium: 9.7 mg/dL (ref 8.4–10.4)
Chloride: 106 mEq/L (ref 98–109)
Creatinine: 1.8 mg/dL — ABNORMAL HIGH (ref 0.6–1.1)
EGFR: 30 mL/min/{1.73_m2} — ABNORMAL LOW (ref >90)
Glucose: 232 mg/dl — ABNORMAL HIGH (ref 70–140)
Potassium: 4.3 mEq/L (ref 3.5–5.1)
Sodium: 139 mEq/L (ref 136–145)
Total Bilirubin: 0.69 mg/dL (ref 0.20–1.20)
Total Protein: 7.1 g/dL (ref 6.4–8.3)

## 2016-05-21 MED ORDER — ACETAMINOPHEN 325 MG PO TABS
650.0000 mg | ORAL_TABLET | Freq: Once | ORAL | Status: AC
  Administered 2016-05-21: 650 mg via ORAL

## 2016-05-21 MED ORDER — SODIUM CHLORIDE 0.9 % IV SOLN
3.4000 mg/kg | Freq: Once | INTRAVENOUS | Status: AC
  Administered 2016-05-21: 360 mg via INTRAVENOUS
  Filled 2016-05-21: qty 10

## 2016-05-21 MED ORDER — SODIUM CHLORIDE 0.9 % IV SOLN
Freq: Once | INTRAVENOUS | Status: AC
  Administered 2016-05-21: 09:00:00 via INTRAVENOUS

## 2016-05-21 MED ORDER — ACETAMINOPHEN 325 MG PO TABS
ORAL_TABLET | ORAL | Status: AC
  Filled 2016-05-21: qty 2

## 2016-05-21 MED ORDER — DIPHENHYDRAMINE HCL 25 MG PO CAPS
50.0000 mg | ORAL_CAPSULE | Freq: Once | ORAL | Status: AC
  Administered 2016-05-21: 50 mg via ORAL

## 2016-05-21 MED ORDER — DIPHENHYDRAMINE HCL 25 MG PO CAPS
ORAL_CAPSULE | ORAL | Status: AC
  Filled 2016-05-21: qty 2

## 2016-05-21 NOTE — Patient Instructions (Signed)
[facility] Discharge Instructions for Patients Receiving Chemotherapy  Today you received the following chemotherapy agents: Kadcyla  To help prevent nausea and vomiting after your treatment, we encourage you to take your nausea medication as directed.    If you develop nausea and vomiting that is not controlled by your nausea medication, call the clinic.   BELOW ARE SYMPTOMS THAT SHOULD BE REPORTED IMMEDIATELY:  *FEVER GREATER THAN 100.5 F  *CHILLS WITH OR WITHOUT FEVER  NAUSEA AND VOMITING THAT IS NOT CONTROLLED WITH YOUR NAUSEA MEDICATION  *UNUSUAL SHORTNESS OF BREATH  *UNUSUAL BRUISING OR BLEEDING  TENDERNESS IN MOUTH AND THROAT WITH OR WITHOUT PRESENCE OF ULCERS  *URINARY PROBLEMS  *BOWEL PROBLEMS  UNUSUAL RASH Items with * indicate a potential emergency and should be followed up as soon as possible.  Feel free to call the clinic you have any questions or concerns. The clinic phone number is [phone].  Please show the CHEMO ALERT CARD at check-in to the Emergency Department and triage nurse.   

## 2016-05-21 NOTE — Progress Notes (Signed)
Ok to treat with creatinine 1.8 per MD Magrinat

## 2016-05-22 ENCOUNTER — Other Ambulatory Visit: Payer: Self-pay | Admitting: *Deleted

## 2016-05-22 MED ORDER — HYDROCHLOROTHIAZIDE 25 MG PO TABS: 25.0000 mg | ORAL_TABLET | Freq: Every day | ORAL | 3 refills | Status: DC

## 2016-05-22 NOTE — Telephone Encounter (Signed)
Patient requests ninety days.

## 2016-05-29 MED ORDER — HYDROCHLOROTHIAZIDE 25 MG PO TABS: 25.0000 mg | ORAL_TABLET | Freq: Every day | ORAL | 3 refills | Status: DC

## 2016-05-29 NOTE — Addendum Note (Signed)
Addended by: JEFFRIES, Sharlot Gowda on: 05/29/2016 05:03 PM   Modules accepted: Orders

## 2016-05-31 ENCOUNTER — Ambulatory Visit: Payer: Commercial Managed Care - PPO

## 2016-06-11 ENCOUNTER — Other Ambulatory Visit (HOSPITAL_BASED_OUTPATIENT_CLINIC_OR_DEPARTMENT_OTHER): Payer: Commercial Managed Care - PPO

## 2016-06-11 ENCOUNTER — Ambulatory Visit (HOSPITAL_BASED_OUTPATIENT_CLINIC_OR_DEPARTMENT_OTHER): Payer: Commercial Managed Care - PPO

## 2016-06-11 ENCOUNTER — Other Ambulatory Visit: Payer: Self-pay | Admitting: Oncology

## 2016-06-11 VITALS — BP 138/69 | HR 57 | Temp 98.7°F | Resp 16

## 2016-06-11 DIAGNOSIS — Z171 Estrogen receptor negative status [ER-]: Secondary | ICD-10-CM

## 2016-06-11 DIAGNOSIS — C50411 Malignant neoplasm of upper-outer quadrant of right female breast: Secondary | ICD-10-CM

## 2016-06-11 DIAGNOSIS — Z5112 Encounter for antineoplastic immunotherapy: Secondary | ICD-10-CM | POA: Diagnosis not present

## 2016-06-11 DIAGNOSIS — C773 Secondary and unspecified malignant neoplasm of axilla and upper limb lymph nodes: Principal | ICD-10-CM

## 2016-06-11 DIAGNOSIS — C50911 Malignant neoplasm of unspecified site of right female breast: Secondary | ICD-10-CM

## 2016-06-11 LAB — CBC WITH DIFFERENTIAL/PLATELET
BASO%: 0.6 % (ref 0.0–2.0)
Basophils Absolute: 0 10*3/uL (ref 0.0–0.1)
EOS%: 2.2 % (ref 0.0–7.0)
Eosinophils Absolute: 0.1 10*3/uL (ref 0.0–0.5)
HCT: 33.7 % — ABNORMAL LOW (ref 34.8–46.6)
HGB: 11.6 g/dL (ref 11.6–15.9)
LYMPH%: 20.1 % (ref 14.0–49.7)
MCH: 30.6 pg (ref 25.1–34.0)
MCHC: 34.4 g/dL (ref 31.5–36.0)
MCV: 88.8 fL (ref 79.5–101.0)
MONO#: 0.4 10*3/uL (ref 0.1–0.9)
MONO%: 7.5 % (ref 0.0–14.0)
NEUT#: 4.1 10*3/uL (ref 1.5–6.5)
NEUT%: 69.6 % (ref 38.4–76.8)
Platelets: 171 10*3/uL (ref 145–400)
RBC: 3.79 10*6/uL (ref 3.70–5.45)
RDW: 14.5 % (ref 11.2–14.5)
WBC: 5.8 10*3/uL (ref 3.9–10.3)
lymph#: 1.2 10*3/uL (ref 0.9–3.3)

## 2016-06-11 LAB — COMPREHENSIVE METABOLIC PANEL
ALT: 32 U/L (ref 0–55)
AST: 31 U/L (ref 5–34)
Albumin: 3.3 g/dL — ABNORMAL LOW (ref 3.5–5.0)
Alkaline Phosphatase: 96 U/L (ref 40–150)
Anion Gap: 10 mEq/L (ref 3–11)
BUN: 34.3 mg/dL — ABNORMAL HIGH (ref 7.0–26.0)
CO2: 24 mEq/L (ref 22–29)
Calcium: 9.4 mg/dL (ref 8.4–10.4)
Chloride: 106 mEq/L (ref 98–109)
Creatinine: 1.4 mg/dL — ABNORMAL HIGH (ref 0.6–1.1)
EGFR: 41 mL/min/{1.73_m2} — ABNORMAL LOW (ref >90)
Glucose: 249 mg/dl — ABNORMAL HIGH (ref 70–140)
Potassium: 4 mEq/L (ref 3.5–5.1)
Sodium: 140 mEq/L (ref 136–145)
Total Bilirubin: 0.59 mg/dL (ref 0.20–1.20)
Total Protein: 6.7 g/dL (ref 6.4–8.3)

## 2016-06-11 MED ORDER — DIPHENHYDRAMINE HCL 25 MG PO CAPS
ORAL_CAPSULE | ORAL | Status: AC
  Filled 2016-06-11: qty 2

## 2016-06-11 MED ORDER — SODIUM CHLORIDE 0.9 % IV SOLN
Freq: Once | INTRAVENOUS | Status: AC
  Administered 2016-06-11: 09:00:00 via INTRAVENOUS

## 2016-06-11 MED ORDER — ADO-TRASTUZUMAB EMTANSINE CHEMO INJECTION 160 MG
3.4000 mg/kg | Freq: Once | INTRAVENOUS | Status: AC
  Administered 2016-06-11: 360 mg via INTRAVENOUS
  Filled 2016-06-11: qty 8

## 2016-06-11 MED ORDER — ACETAMINOPHEN 325 MG PO TABS
ORAL_TABLET | ORAL | Status: AC
  Filled 2016-06-11: qty 2

## 2016-06-11 MED ORDER — ACETAMINOPHEN 325 MG PO TABS
650.0000 mg | ORAL_TABLET | Freq: Once | ORAL | Status: AC
  Administered 2016-06-11: 650 mg via ORAL

## 2016-06-11 MED ORDER — DIPHENHYDRAMINE HCL 25 MG PO CAPS
50.0000 mg | ORAL_CAPSULE | Freq: Once | ORAL | Status: AC
  Administered 2016-06-11: 50 mg via ORAL

## 2016-06-11 NOTE — Patient Instructions (Signed)
[place] Discharge Instructions for Patients Receiving Chemotherapy  Today you received the following chemotherapy agents Kadcycla   To help prevent nausea and vomiting after your treatment, we encourage you to take your nausea medication as directed.    If you develop nausea and vomiting that is not controlled by your nausea medication, call the clinic.   BELOW ARE SYMPTOMS THAT SHOULD BE REPORTED IMMEDIATELY:  *FEVER GREATER THAN 100.5 F  *CHILLS WITH OR WITHOUT FEVER  NAUSEA AND VOMITING THAT IS NOT CONTROLLED WITH YOUR NAUSEA MEDICATION  *UNUSUAL SHORTNESS OF BREATH  *UNUSUAL BRUISING OR BLEEDING  TENDERNESS IN MOUTH AND THROAT WITH OR WITHOUT PRESENCE OF ULCERS  *URINARY PROBLEMS  *BOWEL PROBLEMS  UNUSUAL RASH Items with * indicate a potential emergency and should be followed up as soon as possible.  Feel free to call the clinic you have any questions or concerns. The clinic phone number is [phone].  Please show the [place] at check-in to the Emergency Department and triage nurse.

## 2016-06-14 ENCOUNTER — Ambulatory Visit
Admission: RE | Admit: 2016-06-14 | Discharge: 2016-06-14 | Disposition: A | Discharge status: Home / Self Care | Payer: Commercial Managed Care - PPO | Source: Ambulatory Visit | Attending: Internal Medicine | Admitting: Internal Medicine

## 2016-06-14 DIAGNOSIS — E049 Nontoxic goiter, unspecified: Secondary | ICD-10-CM

## 2016-06-19 ENCOUNTER — Telehealth: Payer: Self-pay | Admitting: Nurse Practitioner

## 2016-06-19 NOTE — Telephone Encounter (Signed)
Kem Boroughs, NP- order placed on desk for signature, last BMD 07/05/2013.

## 2016-06-19 NOTE — Telephone Encounter (Signed)
Patient is asking for a BMD order to be sent to [facility]. Patient asked that she called when this is done so that she can schedule her appointment.

## 2016-06-19 NOTE — Telephone Encounter (Signed)
Order for BMD faxed to [facility], call to patient to notify. Left message, advised requested order faxed, f/u with [facility] for scheduling. Return call to office at [phone] for any additional questions.  Routing to provider for final review. Patient is agreeable to disposition. Will close encounter.

## 2016-06-19 NOTE — Telephone Encounter (Signed)
Note is signed for faxing.

## 2016-06-21 ENCOUNTER — Ambulatory Visit: Payer: Commercial Managed Care - PPO

## 2016-07-02 ENCOUNTER — Other Ambulatory Visit (HOSPITAL_BASED_OUTPATIENT_CLINIC_OR_DEPARTMENT_OTHER): Payer: Commercial Managed Care - PPO

## 2016-07-02 ENCOUNTER — Ambulatory Visit (HOSPITAL_BASED_OUTPATIENT_CLINIC_OR_DEPARTMENT_OTHER): Payer: Commercial Managed Care - PPO | Admitting: Oncology

## 2016-07-02 ENCOUNTER — Ambulatory Visit (HOSPITAL_BASED_OUTPATIENT_CLINIC_OR_DEPARTMENT_OTHER): Payer: Commercial Managed Care - PPO

## 2016-07-02 VITALS — BP 133/56 | HR 69 | Temp 98.2°F | Resp 20 | Ht 69.0 in | Wt 227.7 lb

## 2016-07-02 DIAGNOSIS — Z171 Estrogen receptor negative status [ER-]: Secondary | ICD-10-CM

## 2016-07-02 DIAGNOSIS — C50411 Malignant neoplasm of upper-outer quadrant of right female breast: Secondary | ICD-10-CM | POA: Diagnosis not present

## 2016-07-02 DIAGNOSIS — Z5112 Encounter for antineoplastic immunotherapy: Secondary | ICD-10-CM | POA: Diagnosis not present

## 2016-07-02 DIAGNOSIS — C773 Secondary and unspecified malignant neoplasm of axilla and upper limb lymph nodes: Principal | ICD-10-CM

## 2016-07-02 DIAGNOSIS — C50911 Malignant neoplasm of unspecified site of right female breast: Secondary | ICD-10-CM

## 2016-07-02 LAB — CBC WITH DIFFERENTIAL/PLATELET
BASO%: 0.3 % (ref 0.0–2.0)
Basophils Absolute: 0 10*3/uL (ref 0.0–0.1)
EOS%: 1.9 % (ref 0.0–7.0)
Eosinophils Absolute: 0.1 10*3/uL (ref 0.0–0.5)
HCT: 34.8 % (ref 34.8–46.6)
HGB: 11.5 g/dL — ABNORMAL LOW (ref 11.6–15.9)
LYMPH%: 19 % (ref 14.0–49.7)
MCH: 29.7 pg (ref 25.1–34.0)
MCHC: 33 g/dL (ref 31.5–36.0)
MCV: 89.9 fL (ref 79.5–101.0)
MONO#: 0.5 10*3/uL (ref 0.1–0.9)
MONO%: 7.2 % (ref 0.0–14.0)
NEUT#: 4.9 10*3/uL (ref 1.5–6.5)
NEUT%: 71.6 % (ref 38.4–76.8)
Platelets: 148 10*3/uL (ref 145–400)
RBC: 3.87 10*6/uL (ref 3.70–5.45)
RDW: 14.5 % (ref 11.2–14.5)
WBC: 6.9 10*3/uL (ref 3.9–10.3)
lymph#: 1.3 10*3/uL (ref 0.9–3.3)

## 2016-07-02 LAB — COMPREHENSIVE METABOLIC PANEL
ALT: 29 U/L (ref 0–55)
AST: 34 U/L (ref 5–34)
Albumin: 3.4 g/dL — ABNORMAL LOW (ref 3.5–5.0)
Alkaline Phosphatase: 94 U/L (ref 40–150)
Anion Gap: 9 mEq/L (ref 3–11)
BUN: 34.7 mg/dL — ABNORMAL HIGH (ref 7.0–26.0)
CO2: 25 mEq/L (ref 22–29)
Calcium: 9.3 mg/dL (ref 8.4–10.4)
Chloride: 107 mEq/L (ref 98–109)
Creatinine: 1.5 mg/dL — ABNORMAL HIGH (ref 0.6–1.1)
EGFR: 35 mL/min/{1.73_m2} — ABNORMAL LOW (ref >90)
Glucose: 203 mg/dl — ABNORMAL HIGH (ref 70–140)
Potassium: 4.2 mEq/L (ref 3.5–5.1)
Sodium: 140 mEq/L (ref 136–145)
Total Bilirubin: 0.56 mg/dL (ref 0.20–1.20)
Total Protein: 6.9 g/dL (ref 6.4–8.3)

## 2016-07-02 MED ORDER — DIPHENHYDRAMINE HCL 25 MG PO CAPS
ORAL_CAPSULE | ORAL | Status: AC
  Filled 2016-07-02: qty 2

## 2016-07-02 MED ORDER — SODIUM CHLORIDE 0.9 % IV SOLN
Freq: Once | INTRAVENOUS | Status: AC
  Administered 2016-07-02: 11:00:00 via INTRAVENOUS

## 2016-07-02 MED ORDER — ACETAMINOPHEN 325 MG PO TABS
ORAL_TABLET | ORAL | Status: AC
  Filled 2016-07-02: qty 2

## 2016-07-02 MED ORDER — ACETAMINOPHEN 325 MG PO TABS
650.0000 mg | ORAL_TABLET | Freq: Once | ORAL | Status: AC
  Administered 2016-07-02: 650 mg via ORAL

## 2016-07-02 MED ORDER — SODIUM CHLORIDE 0.9 % IV SOLN
3.4000 mg/kg | Freq: Once | INTRAVENOUS | Status: AC
  Administered 2016-07-02: 360 mg via INTRAVENOUS
  Filled 2016-07-02: qty 10

## 2016-07-02 MED ORDER — DIPHENHYDRAMINE HCL 25 MG PO CAPS
25.0000 mg | ORAL_CAPSULE | Freq: Once | ORAL | Status: AC
  Administered 2016-07-02: 25 mg via ORAL

## 2016-07-02 NOTE — Progress Notes (Signed)
[place]  [phone]) [phone] [phone]) [phone]   ID: Darlene [name] DOB: Aug 05, 1950  MR#: [id number]  AGT#:[id number]  Patient Care Team: Vernie Shanks, MD as PCP - General (Family Medicine) Himmelrich, Bryson Ha, RD (Inactive) as Dietitian (Bariatrics) Stark Klein, MD as Consulting Physician (General Surgery) Magrinat, Virgie Dad, MD as Consulting Physician (Oncology) Arloa Koh, MD as Consulting Physician (Radiation Oncology) Mauro Kaufmann, RN as Registered Nurse Rockwell Germany, RN as Registered Nurse Jacelyn Pi, MD as Consulting Physician (Endocrinology) Kem Boroughs, [place] as Nurse Practitioner (Family Medicine) [name], MD as Consulting Physician (Plastic Surgery) Larey Dresser, MD as Consulting Physician (Cardiology) PCP: Vernie Shanks, MD OTHER MD:  CHIEF COMPLAINT:  HER-2 positive, estrogen receptor negative locally recurrent disease  CURRENT TREATMENT:  T-DM1  INTERVAL HISTORY:  Darlene [name] returns today for follow-up and treatment of her recurrent HER-2 positive breast cancer, accompanied by her husband Darlene [name]. Darlene [name] is due to receive her sixth of 8 planned TDM 1 doses today. She tolerates these well, and continues to work full time.  She gives me no signs suggestive of congestive heart failure and her next echocardiogram is scheduled for 07/08/2016  She also had a thyroid ultrasound since her last visit here which did not suggest malignancy.  REVIEW OF SYSTEMS: Darlene [name] is doing "pretty well". Sometimes she gets anxious. She is feeling a little bit more short these days. She was surprised to learn her A1c was 8. Her insulin was adjusted and she is trying to change her diet. She admits to being a "worry eater". She is trying to walk a little more. She thinks she gets about 04-4998 steps a day. She has hearing problems, and a runny nose, and poor vision in general, but overall he detailed review of systems today was  stable  BREAST CANCER HISTORY: From the original intake note:  "Darlene [name]" had screening mammography at her gynecologist suggestive of a possible abnormality in the right breast. However right diagnostic mammogram 04/12/2013 at [facility] was negative. More recently however, she noted a lump in her right breast and brought it to her gynecologist's attention. On 07/18/2014 the patient had bilateral diagnostic mammography with tomosynthesis at [facility]. The breast density was category B. In the right breast upper outer quadrant there was an area of focal asymmetry with indistinct margins.[name] Ultrasound was obtained and showed in addition to the mass in question, measuring 1.7 cm, an abnormal-appearing lymph node in the right axillary tail.  On 07/19/2014 the patient underwent biopsy of the right breast massin question as well as a right axillary lymph node. Both were positive for invasive ductal carcinoma, grade 2, estrogen and progesterone receptor negative, with an MIB-1 of 90%, and HER-2 pending. Incidentally the lymph node biopsy showed a lymphocytic inflammatory component but no lymph node architecture.  Her subsequent history is as detailed below.  PAST MEDICAL HISTORY: Past Medical History:  Diagnosis Date  . Anemia    hx of  . Anxiety   . Arthritis    in fingers, shoulders  . Balance problem   . Breast cancer ([place])   . Breast cancer of upper-outer quadrant of right female breast ([place]) 07/22/2014  . Clumsiness   . Constipation   . Depression   . Diabetes mellitus without complication ([place])    22+ years  . Eating disorder    binge eating  . Epistaxis 08/26/2014  . Fatty liver   . HCAP (healthcare-associated pneumonia) 12/18/2014  . Headache   .  Heart murmur    never had any problems  . Hyperlipidemia   . Hypertension   . Neuromuscular disorder ([place])    diabetic neuropathy  . Obesity   . Obstructive sleep apnea on CPAP    uses cpap nightly  . Ovarian cyst rupture    possible  . Pneumonia    . Pneumonia 11/2014  . Radiation 04/05/15-05/19/15   right chest wall 50.4 Gy, mastectomy scar 10 Gy  . Thyroid nodule 06/2014    PAST SURGICAL HISTORY: Past Surgical History:  Procedure Laterality Date  . BIOPSY THYROID Bilateral 06/2014   2 nodules - Benign   . BREAST LUMPECTOMY WITH RADIOACTIVE SEED AND SENTINEL LYMPH NODE BIOPSY Right 12/27/2014   Procedure: BREAST LUMPECTOMY WITH RADIOACTIVE SEED AND SENTINEL LYMPH NODE BIOPSY;  Surgeon: Stark Klein, MD;  Location: [place];  Service: General;  Laterality: Right;  . BREAST REDUCTION SURGERY Bilateral 01/06/2015   Procedure: BILATERAL BREAST REDUCTION, RIGHT ONCOPLASTIC RECONSTRUCTION),LEFT BREAST REDUCTION FOR SYMMETRY;  Surgeon: [name], MD;  Location: [place];  Service: Plastics;  Laterality: Bilateral;  . BREATH TEK H PYLORI N/A 09/15/2012   Procedure: BREATH TEK H PYLORI;  Surgeon: Pedro Earls, MD;  Location: [facility];  Service: General;  Laterality: N/A;  . BUNIONECTOMY Left 12/2013  . CARPAL TUNNEL RELEASE Right   . CARPAL TUNNEL RELEASE Left   . DUPUYTREN CONTRACTURE RELEASE    . GASTRIC ROUX-EN-Y N/A 11/02/2012   Procedure: LAPAROSCOPIC ROUX-EN-Y GASTRIC;  Surgeon: Pedro Earls, MD;  Location: [facility];  Service: General;  Laterality: N/A;  . IR GENERIC HISTORICAL  03/18/2016   IR US GUIDE VASC ACCESS LEFT 03/18/2016 Markus Daft, MD [facility]  . IR GENERIC HISTORICAL  03/18/2016   IR FLUORO GUIDE CV LINE LEFT 03/18/2016 Markus Daft, MD [facility]  . LAPAROSCOPIC APPENDECTOMY N/A 07/22/2015   Procedure: APPENDECTOMY LAPAROSCOPIC;  Surgeon: Michael Boston, MD;  Location: [facility];  Service: General;  Laterality: N/A;  . MASTECTOMY Right 02/15/2015   modified  . MODIFIED MASTECTOMY Right 02/15/2015   Procedure: RIGHT MODIFIED RADICAL MASTECTOMY;  Surgeon: Stark Klein, MD;  Location: [place];  Service: General;  Laterality: Right;  . PORT-A-CATH REMOVAL Left 10/10/2015   Procedure: PORT REMOVAL;   Surgeon: Stark Klein, MD;  Location: [place];  Service: General;  Laterality: Left;  . PORTACATH PLACEMENT Left 08/02/2014   Procedure: INSERTION PORT-A-CATH;  Surgeon: Stark Klein, MD;  Location: [place];  Service: General;  Laterality: Left;  . ROTATOR CUFF REPAIR Right 2005  . SCAR REVISION Right 12/08/2015   Procedure: COMPLEX REPAIR RIGHT CHEST 10-15CM;  Surgeon: [name], MD;  Location: [place];  Service: Plastics;  Laterality: Right;  . TOE SURGERY  1970s   bone spur  . TRIGGER FINGER RELEASE     x3    FAMILY HISTORY Family History  Problem Relation Age of Onset  . CVA Mother   . Diabetes Father   . Heart failure Father   . Hypertension Sister   . Diabetes Brother   . Breast cancer Paternal Grandmother   . Diabetes Paternal Uncle    the patient's father died from complications of diabetes at age 45. The patient's mother died at age 28 with a subarachnoid hemorrhage. The patient had one brother, one sister. The only breast cancer was the patient's paternal grandmother diagnosed in her 45s. There is no history of ovarian cancer in the family.  GYNECOLOGIC HISTORY:  Patient's  last menstrual period was 10/22/2007.  menarche age 82, the patient is GX P0. She went through the change of life in 2011. She did not take hormone replacement.  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 Her husband Darlene [name] is a retired Visual merchandiser (he taught at [employer]).  Darlene [name] has 2 children from an earlier marriage, Rockey Situ who teaches in [place], and Jalaine Riggenbach,  who works at the [employer] in in [place]. He has 1 grand son, aged 50. The patient and her husband are not church attender's    ADVANCED DIRECTIVES:  Not in place   HEALTH MAINTENANCE: Social History  Substance Use Topics  . Smoking status: Never Smoker  . Smokeless tobacco: Never Used  . Alcohol use  0.0 oz/week     Comment: social     Colonoscopy:  May 2016  PAP:  Bone density: 07/05/2013 normal, with a T score of 0.4  Lipid panel:  Allergies  Allergen Reactions  . Nsaids Other (See Comments)    H/o gastric bypass - avoid NSAIDs  . Peanuts [Peanut Oil] Itching    "Large amounts"  . Tape Hives and Rash    Adhesives in bandaids    Current Outpatient Prescriptions  Medication Sig Dispense Refill  . acetaminophen (TYLENOL) 325 MG tablet Take 650 mg by mouth 2 (two) times daily as needed for moderate pain or headache. Reported on 04/17/2015    . calcium citrate-vitamin D (CITRACAL+D) 315-200 MG-UNIT tablet Take 1 tablet by mouth 3 (three) times daily.    . diphenhydrAMINE (BENADRYL) 25 MG tablet Take 25 mg by mouth daily as needed for allergies or sleep. Reported on 03/06/2015    . Docusate Sodium (COLACE PO) Take by mouth. Takes a day    . glucose blood (ONETOUCH VERIO) test strip 1 each by Other route as needed for other. Reported on 03/06/2015    . hydrochlorothiazide (HYDRODIURIL) 25 MG tablet Take 1 tablet (25 mg total) by mouth daily. 90 tablet 3  . losartan (COZAAR) 100 MG tablet Take 1 tablet by mouth daily. Reported on 03/06/2015    . Melatonin 5 MG TABS Take 5 mg by mouth at bedtime. Reported on 03/06/2015    . mometasone (NASONEX) 50 MCG/ACT nasal spray Place 1-2 sprays into the nose daily as needed (for allergies.).     [name] NOVOLIN 70/30 (70-30) 100 UNIT/ML injection Inject 5-10 Units into the skin 2 (two) times daily with a meal. Reported on 03/06/2015    . Pediatric Multivitamins-Iron (FLINTSTONES PLUS IRON) chewable tablet Chew 1 tablet by mouth 2 (two) times daily.    . Polyethylene Glycol 3350 (MIRALAX PO) Take 1 packet by mouth as needed.    . Probiotic Product ([employer]S PROBIOTIC PO) Take 1 capsule by mouth daily. Reported on 03/06/2015    . prochlorperazine (COMPAZINE) 10 MG tablet Take 1 tablet (10 mg total) by mouth every 6 (six) hours as needed for nausea or vomiting. 30  tablet 0  . Propylene Glycol (SYSTANE BALANCE OP) Apply 1-2 drops to eye 3 (three) times daily.    [name] terbinafine (LAMISIL) 250 MG tablet Take 250 mg by mouth every 30 (thirty) days.    . Terbinafine HCl (LAMISIL PO) Take by mouth.    . Vortioxetine HBr (TRINTELLIX) 20 MG TABS Take 20 mg by mouth at bedtime.      No current facility-administered medications for this visit.  OBJECTIVE: Middle-aged white woman Who appears stated age  75:   07/02/16 0914  BP: (!) 133/56  Pulse: 69  Resp: 20  Temp: 98.2 F (36.8 C)     Body mass index is 33.63 kg/m.    ECOG FS:1 - Symptomatic but completely ambulatory Filed Weights   07/02/16 0914  Weight: 227 lb 11.2 oz (103.3 kg)   Sclerae unicteric, EOMs intact Oropharynx clear and moist No cervical or supraclavicular adenopathy Lungs no rales or rhonchi Heart regular rate and rhythm Abd soft, nontender, positive bowel sounds MSK no focal spinal tenderness, no upper extremity lymphedema Neuro: nonfocal, well oriented, appropriate affect Breasts: Deferred   LAB RESULTS:  CMP     Component Value Date/Time   NA 140 06/11/2016 0810   K 4.0 06/11/2016 0810   CL 105 12/08/2015 1028   CO2 24 06/11/2016 0810   GLUCOSE 249 (H) 06/11/2016 0810   BUN 34.3 (H) 06/11/2016 0810   CREATININE 1.4 (H) 06/11/2016 0810   CALCIUM 9.4 06/11/2016 0810   PROT 6.7 06/11/2016 0810   ALBUMIN 3.3 (L) 06/11/2016 0810   AST 31 06/11/2016 0810   ALT 32 06/11/2016 0810   ALKPHOS 96 06/11/2016 0810   BILITOT 0.59 06/11/2016 0810   GFRNONAA 51 (L) 10/10/2015 1007   GFRNONAA 55 (L) 02/18/2013 0827   GFRAA 59 (L) 10/10/2015 1007   GFRAA 63 02/18/2013 0827    INo results found for: SPEP, UPEP  Lab Results  Component Value Date   WBC 6.9 07/02/2016   NEUTROABS 4.9 07/02/2016   HGB 11.5 (L) 07/02/2016   HCT 34.8 07/02/2016   MCV 89.9 07/02/2016   PLT 148 07/02/2016      Chemistry      Component Value Date/Time   NA 140 06/11/2016 0810   K  4.0 06/11/2016 0810   CL 105 12/08/2015 1028   CO2 24 06/11/2016 0810   BUN 34.3 (H) 06/11/2016 0810   CREATININE 1.4 (H) 06/11/2016 0810      Component Value Date/Time   CALCIUM 9.4 06/11/2016 0810   ALKPHOS 96 06/11/2016 0810   AST 31 06/11/2016 0810   ALT 32 06/11/2016 0810   BILITOT 0.59 06/11/2016 0810       No results found for: LABCA2  No components found for: MEBRA309  No results for input(s): INR in the last 168 hours.  Urinalysis    Component Value Date/Time   COLORURINE YELLOW 07/21/2015 [place] 07/21/2015 2328   LABSPEC 1.020 07/21/2015 2328   PHURINE 6.0 07/21/2015 2328   GLUCOSEU NEGATIVE 07/21/2015 2328   HGBUR TRACE (A) 07/21/2015 2328   BILIRUBINUR NEGATIVE 07/21/2015 2328   BILIRUBINUR neg 06/29/2013 1017   KETONESUR NEGATIVE 07/21/2015 2328   PROTEINUR 30 (A) 07/21/2015 2328   UROBILINOGEN negative 06/29/2013 1017   NITRITE NEGATIVE 07/21/2015 2328   LEUKOCYTESUR NEGATIVE 07/21/2015 2328    STUDIES: US Thyroid  Result Date: 06/14/2016 CLINICAL DATA:  Goiter.  Bilateral thyroid biopsies on 06/28/2015. EXAM: THYROID ULTRASOUND TECHNIQUE: Ultrasound examination of the thyroid gland and adjacent soft tissues was performed. COMPARISON:  05/31/2015 FINDINGS: Parenchymal Echotexture: Moderately heterogenous Isthmus: 0.8 cm in the AP dimension Right lobe: 5.5 x 2.1 x 1.7 cm and previously measured 5.2 x 2.3 x 2.1 cm Left lobe: 5.3 x 1.8 x 1.9 cm and previously measures 6.3 x 2.4 x 2.6 cm _________________________________________________________ Estimated total number of nodules >/= 1 cm: 4 Number of spongiform nodules >/=  2 cm not described below (  TR1): 0 Number of mixed cystic and solid nodules >/= 1.5 cm not described below ([place]): 0 _________________________________________________________ Nodule # 1: Prior biopsy: No Location: Right; Mid Maximum size: 1.1 cm; Other 2 dimensions: 1.0 x 1.0 cm, previously, 1.6 x 1.1 x 1.3 cm Composition:  spongiform (0) Echogenicity: cannot determine (1) Shape: not taller-than-wide (0) Margins: smooth (0) Echogenic foci: large comet-tail artifacts (0) ACR TI-RADS total points: 1. ACR TI-RADS risk category:  TR1 (0-1 points). Significant change in size (>/= 20% in two dimensions and minimal increase of 2 mm): No Change in features: No Change in ACR TI-RADS risk category: No ACR TI-RADS recommendations: This nodule does NOT meet TI-RADS criteria for biopsy or dedicated follow-up. _________________________________________________________ Nodule # 2: Prior biopsy: No Location: Right; Superior Maximum size: 0.7 cm; Other 2 dimensions: 0.6 x 0.5 cm, previously, 0.5 cm Composition: cannot determine (2) Echogenicity: cannot determine (1) Shape: not taller-than-wide (0) Margins: ill-defined (0) Echogenic foci: peripheral calcifications (2) ACR TI-RADS total points: 5. ACR TI-RADS risk category:  TR4 (4-6 points). Significant change in size (>/= 20% in two dimensions and minimal increase of 2 mm): No Change in features: No Change in ACR TI-RADS risk category: No ACR TI-RADS recommendations: Given size (<0.9 cm) and appearance, this nodule does NOT meet TI-RADS criteria for biopsy or dedicated follow-up. _________________________________________________________ There is another spongiform nodule with comet tail artifacts in the mid right thyroid lobe. This could represent the previously biopsied nodule. This nodule measures 1.0 x 0.7 x 0.8 cm. There appears to be an adjacent spongiform nodule in the mid right thyroid lobe. Nodule # 3: Prior biopsy: No Location: Left; Superior Maximum size: 1.0 cm; Other 2 dimensions: 1.0 x 1.0 cm, previously, 1.2 x 0.9 x 1.2 cm Composition: spongiform (0) Echogenicity: isoechoic (1) Shape: not taller-than-wide (0) Margins: smooth (0) Echogenic foci: none (0) ACR TI-RADS total points: 1. ACR TI-RADS risk category:  TR1 (0-1 points). Significant change in size (>/= 20% in two dimensions and  minimal increase of 2 mm): No Change in features: No Change in ACR TI-RADS risk category: No ACR TI-RADS recommendations: This nodule does NOT meet TI-RADS criteria for biopsy or dedicated follow-up. _________________________________________________________ There is a poorly defined heterogeneous nodule in the inferior left thyroid lobe which appears to represent the previously biopsied nodule. This nodule now measures 0.8 x 1.0 x 2.1 cm and previously measured 2.3 x 1.7 x 2.8 cm. This nodule is now more echogenic but previously there was more of a cystic component. This probably represents a complex cystic nodule or even a spongiform nodule. There are additional small nodules throughout the left thyroid lobe. IMPRESSION: Multiple thyroid nodules. Difficult to compare nodules between examinations. Many of the nodules appear to represent spongiform nodules. Previously biopsied left inferior thyroid nodule has markedly decreased in size and contains less fluid or cystic components. No suspicious nodules that meet criteria for biopsy. The above is in keeping with the ACR TI-RADS recommendations - J Am Coll Radiol 2017;14:587-595. Electronically Signed   By: Markus Daft M.D.   On: 06/14/2016 17:36    ASSESSMENT: 66 y.o. [place] woman status post right breast upper outer quadrant and right axillary lymph node biopsy 07/19/2014 for a cT2  pN1, stage IIB  invasive ductal carcinoma, grade 2, estrogen and progesterone receptor negative, with an MIB-1 of 90%, and HER-2 amplified  (1) neoadjuvant chemotherapy started 08/08/2014, consisting of carboplatin, docetaxel, trastuzumab and pertuzumab given every 3 weeks 6, completed 11/21/2014  (a) breast MRI after initial 3  cycles shows a significant response  (b) breast MRI after 6 cycles shows a complete radiologic response   (2) trastuzumab continued through 10/02/2015 (to end of capecitabine therapy)  (a) echo 02/13/2015 shows an EF of 60-65%  (b)  Echo 05/09/2015  shows an ejection fraction of 60-65%  (c) echocardiogram 06/27/2015 shows an ejection fraction of 55-60%.  (3) right lumpectomy with sentinel lymph node biopsy on 12/27/14 showed a residual  ypT2 ypN1a, stage IIB invasive ductal carcinoma, grade 2, with repeat estrogen and progesterone receptors again negative  (4) status post right oncoplastic surgery (with left reduction mammoplasty) 01/06/2015 showing in the right breast multiple foci of intralymphatic carcinoma in random sections  (5)  Right modified radical mastectomy 02/15/2014 showed no residual invasive disease; there was DCIS but margins were negative; all 15 axillary lymph nodes were clear; ypTis ypN0  (a)  The patient has decided against reconstruction.  (6)  Postmastectomy radiation completed 05/18/2015 with capecitabine sensitization  (7) adjuvant capecitabine continued through 09/27/2015  (8) hepatic steatosis with increased fibrosis score (at risk for cirrhosis)  (9) thyroid biopsy (left lobe inferiorly) 2 shows benign tissue 06/28/2015  (10) LOCAL RECURRENCE:  (a) PET scan 12/06/2015 Notes a right retropectoral lymph node measuring 0.9 cm with an SUV max of 5.5  (b) repeat PET scan 03/11/2016 finds the right subpectoral lymph node to now measure 1.3 cm with an SUV max of 11.8.  (c) PET scan 05/07/2016 shows a significant response to T-DM 1  (11) started T-DM1 03/21/2016  (a) echocardiogram 03/15/2016 shows an ejection fraction in the 55-60% range.  PLAN: I spent approximately 30 minutes with [name] with most of that time spent discussing her situation. She will receive her sixth of 8 planned cycles of T-DM 1 today. She is tolerating those well and she has an echocardiogram already scheduled for next week. She gives no symptoms suggestive of congestive heart failure.  She is going to see me with her eighth dose, on July 24. I am putting her in for a PET scan the following week. If there has been a complete response  radiographically we will switch her to monthly trastuzumab and continue that at least for another 2 years.  If we still have measurable disease I will refer her to radiation for consideration of palliative treatment  Today we discussed her thyroid nodules, and she is fine from that point of view.  We also discussed her A1c of 8. She really does need to modify her diet and she needs to increase her exercise program. We went into this in great detail today.  She is planning on bunionectomy and cataract surgery later this year. I have no problems with either of those procedures  Otherwise we are proceeding to treatment as planned. She knows to call for any other problems that may develop before her next visit.     Chauncey Cruel, MD   07/02/2016 9:17 AM

## 2016-07-02 NOTE — Patient Instructions (Signed)
[facility] Discharge Instructions for Patients Receiving Chemotherapy  Today you received the following chemotherapy agents Kadcycla   To help prevent nausea and vomiting after your treatment, we encourage you to take your nausea medication as directed.    If you develop nausea and vomiting that is not controlled by your nausea medication, call the clinic.   BELOW ARE SYMPTOMS THAT SHOULD BE REPORTED IMMEDIATELY:  *FEVER GREATER THAN 100.5 F  *CHILLS WITH OR WITHOUT FEVER  NAUSEA AND VOMITING THAT IS NOT CONTROLLED WITH YOUR NAUSEA MEDICATION  *UNUSUAL SHORTNESS OF BREATH  *UNUSUAL BRUISING OR BLEEDING  TENDERNESS IN MOUTH AND THROAT WITH OR WITHOUT PRESENCE OF ULCERS  *URINARY PROBLEMS  *BOWEL PROBLEMS  UNUSUAL RASH Items with * indicate a potential emergency and should be followed up as soon as possible.  Feel free to call the clinic you have any questions or concerns. The clinic phone number is [phone].  Please show the [facility] at check-in to the Emergency Department and triage nurse.

## 2016-07-08 ENCOUNTER — Ambulatory Visit (HOSPITAL_COMMUNITY)
Admission: RE | Admit: 2016-07-08 | Discharge: 2016-07-08 | Disposition: A | Discharge status: Home / Self Care | Payer: Commercial Managed Care - PPO | Source: Ambulatory Visit | Attending: Internal Medicine | Admitting: Internal Medicine

## 2016-07-08 ENCOUNTER — Ambulatory Visit (HOSPITAL_BASED_OUTPATIENT_CLINIC_OR_DEPARTMENT_OTHER)
Admission: RE | Admit: 2016-07-08 | Discharge: 2016-07-08 | Disposition: A | Discharge status: Home / Self Care | Payer: Commercial Managed Care - PPO | Source: Ambulatory Visit | Attending: Cardiology | Admitting: Cardiology

## 2016-07-08 ENCOUNTER — Encounter (HOSPITAL_COMMUNITY): Payer: Self-pay

## 2016-07-08 VITALS — BP 140/75 | HR 67 | Wt 228.0 lb

## 2016-07-08 DIAGNOSIS — Z79899 Other long term (current) drug therapy: Secondary | ICD-10-CM | POA: Diagnosis not present

## 2016-07-08 DIAGNOSIS — I10 Essential (primary) hypertension: Secondary | ICD-10-CM

## 2016-07-08 DIAGNOSIS — Z171 Estrogen receptor negative status [ER-]: Secondary | ICD-10-CM

## 2016-07-08 DIAGNOSIS — Z794 Long term (current) use of insulin: Secondary | ICD-10-CM | POA: Diagnosis not present

## 2016-07-08 DIAGNOSIS — Z923 Personal history of irradiation: Secondary | ICD-10-CM | POA: Diagnosis not present

## 2016-07-08 DIAGNOSIS — E669 Obesity, unspecified: Secondary | ICD-10-CM | POA: Diagnosis not present

## 2016-07-08 DIAGNOSIS — C773 Secondary and unspecified malignant neoplasm of axilla and upper limb lymph nodes: Secondary | ICD-10-CM | POA: Diagnosis not present

## 2016-07-08 DIAGNOSIS — F329 Major depressive disorder, single episode, unspecified: Secondary | ICD-10-CM | POA: Diagnosis not present

## 2016-07-08 DIAGNOSIS — C50911 Malignant neoplasm of unspecified site of right female breast: Secondary | ICD-10-CM

## 2016-07-08 DIAGNOSIS — F419 Anxiety disorder, unspecified: Secondary | ICD-10-CM | POA: Insufficient documentation

## 2016-07-08 DIAGNOSIS — C50411 Malignant neoplasm of upper-outer quadrant of right female breast: Secondary | ICD-10-CM | POA: Diagnosis not present

## 2016-07-08 DIAGNOSIS — Z9221 Personal history of antineoplastic chemotherapy: Secondary | ICD-10-CM | POA: Insufficient documentation

## 2016-07-08 DIAGNOSIS — E785 Hyperlipidemia, unspecified: Secondary | ICD-10-CM | POA: Insufficient documentation

## 2016-07-08 DIAGNOSIS — G4733 Obstructive sleep apnea (adult) (pediatric): Secondary | ICD-10-CM | POA: Diagnosis not present

## 2016-07-08 DIAGNOSIS — E1140 Type 2 diabetes mellitus with diabetic neuropathy, unspecified: Secondary | ICD-10-CM | POA: Diagnosis not present

## 2016-07-08 LAB — ECHOCARDIOGRAM COMPLETE
E decel time: 384 msec
E/e' ratio: 14.21
FS: 35 % (ref 28–44)
IVS/LV PW RATIO, ED: 0.79
LA ID, A-P, ES: 41 mm
LA diam end sys: 41 mm
LA diam index: 1.8 cm/m2
LA vol A4C: 42 ml
LA vol index: 23.7 mL/m2
LA vol: 54.1 mL
LV E/e' medial: 14.21
LV E/e'average: 14.21
LV PW d: 13.4 mm — AB (ref 0.6–1.1)
LV e' LATERAL: 5.22 cm/s
LVOT SV: 76 mL
LVOT VTI: 24.1 cm
LVOT area: 3.14 cm2
LVOT diameter: 20 mm
LVOT peak grad rest: 4 mmHg
LVOT peak vel: 93.8 cm/s
Lateral S' vel: 9.9 cm/s
MV Dec: 384
MV Peak grad: 2 mmHg
MV pk A vel: 90.2 m/s
MV pk E vel: 74.2 m/s
TAPSE: 19.8 mm
TDI e' lateral: 5.22
TDI e' medial: 9.36

## 2016-07-08 NOTE — Progress Notes (Signed)
Patient ID: Darlene [name], female   DOB: 24-Sep-1950, 66 y.o.   MRN: [id number] Oncologist: Dr Jana Hakim   HPI: Darlene [name] is a 66 year old with history of R breast cancer, right axillary lymph node biopsy 07/19/2014 for a cT2 pN1, stage IIB invasive ductal carcinoma, grade 2, estrogen and progesterone receptor negative, with an MIB-1 of 90%, and HER-2 positive , OSA -> CPAP, bariatric surgery, and DM, referred to cardio-oncology clinic by Dr Jana Hakim  Completed neoadjuvant chemotherapy with carboplatin, docetaxel, trastuzumab and pertuzumab given every 3 weeks 6. Trastuzumab + capecitabine were completed in 9/17.  She had lumpectomy 12/16 and had mastectomy in 1/17.  She has completed radiation.   She was found in 11/17 to have a recurrence by PET => right retropectoral lymph node enlarged. In 2/18, she started on treatment with ado-trastuzumab emtansine, to be continued for 9 wks.  Echo prior to treatment showed stable EF.    No chest pain, no exertional dyspnea.  BP upper normal.    Echo (7/16) with EF 60-65%, grade II diastolic dysfunction.  Echo (10/16) with EF 66%, GLS -17%. Echo (1/17) with EF 60-65%, grade II diastolic dysfunction, lateral s' 10, GLS -18.8% Echo (4/17) with EF 60-65%, grade II diastolic dysfunction, lateral s' 9.5, strain poorly traced and off axis, normal RV size and systolic function.  Echo (6/17) with EF 55-60%, aortic sclerosis without stenosis, difficult images for strain, probably not accurate.  Echo (10/17) with EF 55-60%, mild LVH, moderate diastolic dysfunction, normal RV size and systolic function.  Echo (2/18) with EF 55-60%, GLS -15% but technically difficult study Echo (6/18) with EF 60-65%, GLS -15% but technically difficult study.   Review of Systems: All systems reviewed and negative except as per HPI.   Past Medical History:  Diagnosis Date  . Anemia    hx of  . Anxiety   . Arthritis    in fingers, shoulders  . Balance problem   . Breast  cancer ([place])   . Breast cancer of upper-outer quadrant of right female breast ([place]) 07/22/2014  . Clumsiness   . Constipation   . Depression   . Diabetes mellitus without complication ([place])    12+ years  . Eating disorder    binge eating  . Epistaxis 08/26/2014  . Fatty liver   . HCAP (healthcare-associated pneumonia) 12/18/2014  . Headache   . Heart murmur    never had any problems  . Hyperlipidemia   . Hypertension   . Neuromuscular disorder ([place])    diabetic neuropathy  . Obesity   . Obstructive sleep apnea on CPAP    uses cpap nightly  . Ovarian cyst rupture    possible  . Pneumonia   . Pneumonia 11/2014  . Radiation 04/05/15-05/19/15   right chest wall 50.4 Gy, mastectomy scar 10 Gy  . Thyroid nodule 06/2014    Current Outpatient Prescriptions  Medication Sig Dispense Refill  . acetaminophen (TYLENOL) 325 MG tablet Take 650 mg by mouth 2 (two) times daily as needed for moderate pain or headache. Reported on 04/17/2015    . calcium citrate-vitamin D (CITRACAL+D) 315-200 MG-UNIT tablet Take 1 tablet by mouth 3 (three) times daily.    . diphenhydrAMINE (BENADRYL) 25 MG tablet Take 25 mg by mouth daily as needed for allergies or sleep. Reported on 03/06/2015    . Docusate Sodium (COLACE PO) Take by mouth. Takes a day    . glucose blood (ONETOUCH VERIO) test strip 1 each by Other route  as needed for other. Reported on 03/06/2015    . hydrochlorothiazide (HYDRODIURIL) 25 MG tablet Take 1 tablet (25 mg total) by mouth daily. 90 tablet 3  . losartan (COZAAR) 100 MG tablet Take 1 tablet by mouth daily. Reported on 03/06/2015    . Melatonin 5 MG TABS Take 5 mg by mouth at bedtime. Reported on 03/06/2015    . mometasone (NASONEX) 50 MCG/ACT nasal spray Place 1-2 sprays into the nose daily as needed (for allergies.).     [name] NOVOLIN 70/30 (70-30) 100 UNIT/ML injection Inject 5-10 Units into the skin 2 (two) times daily with a meal. Reported on 03/06/2015    . Pediatric Multivitamins-Iron  (FLINTSTONES PLUS IRON) chewable tablet Chew 1 tablet by mouth 2 (two) times daily.    . Polyethylene Glycol 3350 (MIRALAX PO) Take 1 packet by mouth as needed.    . Probiotic Product ([employer]S PROBIOTIC PO) Take 1 capsule by mouth daily. Reported on 03/06/2015    . prochlorperazine (COMPAZINE) 10 MG tablet Take 1 tablet (10 mg total) by mouth every 6 (six) hours as needed for nausea or vomiting. 30 tablet 0  . Propylene Glycol (SYSTANE BALANCE OP) Apply 1-2 drops to eye 3 (three) times daily.    [name] terbinafine (LAMISIL) 250 MG tablet Take 250 mg by mouth every 30 (thirty) days.    . Terbinafine HCl (LAMISIL PO) Take by mouth.    . Vortioxetine HBr (TRINTELLIX) 20 MG TABS Take 20 mg by mouth at bedtime.      No current facility-administered medications for this encounter.     Allergies  Allergen Reactions  . Nsaids Other (See Comments)    H/o gastric bypass - avoid NSAIDs  . Peanuts [Peanut Oil] Itching    "Large amounts"  . Tape Hives and Rash    Adhesives in bandaids      Social History   Social History  . Marital status: Married    Spouse name: Darlene [name]  . Number of children: 0  . 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 Smoking status: Never Smoker  . Smokeless tobacco: Never Used  . Alcohol use 0.0 oz/week     Comment: social  . Drug use: No  . Sexual activity: Yes    Birth control/ protection: Post-menopausal   Other Topics Concern  . Not on file   Social History Narrative   Patient is married Darlene [name]) and lives at home with her husband.   Patient does not have any children.   Patient is working for a [employer].   Patient has a Bachelor's degree.   Patient is right-handed.   Patient does not drink any caffeine.      Family History  Problem Relation Age of Onset  . CVA Mother   . Diabetes Father   . Heart failure Father   . Hypertension Sister   . Diabetes Brother    . Breast cancer Paternal Grandmother   . Diabetes Paternal Uncle     Vitals:   07/08/16 0953  BP: 140/75  Pulse: 67  SpO2: 99%  Weight: 228 lb (103.4 kg)    PHYSICAL EXAM: General:  NAD HEENT: normal Neck: supple. JVP 7 cm. Carotids 2+ bilat; no bruits. No lymphadenopathy or thryomegaly appreciated. Cor: PMI nondisplaced. Regular rate & rhythm. No rubs, gallops or murmurs. Lungs: clear to auscultation bilaterally Abdomen: soft, nontender, nondistended. No hepatosplenomegaly. No bruits or masses.  Good bowel sounds. Extremities: no cyanosis, clubbing, rash.  No edema.  Neuro: alert & oriented x 3, cranial nerves grossly intact. moves all 4 extremities w/o difficulty. Affect pleasant.  ASSESSMENT & PLAN:  1. Breast cancer: She has had recurrence and is getting ado-trastuzumab emtansine.  I reviewed her echo today: EF was stable, strain images were poorly and hard to interpret (similar to prior). She may be transitioning back to Herceptin soon.  - Repeat echo in 3 months with followup appointment.  2. HTN: BP upper normal with addition of HCTZ.  Continue HCTZ and losartan.  3. OSA: Continue nightly CPAP   Loralie Champagne 07/08/2016

## 2016-07-08 NOTE — Progress Notes (Signed)
  Echocardiogram 2D Echocardiogram has been performed.  Darlene [name] 07/08/2016, 9:53 AM

## 2016-07-08 NOTE — Patient Instructions (Signed)
Follow up and Echo with Dr.McLean in 32months

## 2016-07-09 ENCOUNTER — Ambulatory Visit (INDEPENDENT_AMBULATORY_CARE_PROVIDER_SITE_OTHER): Payer: Commercial Managed Care - PPO | Admitting: Nurse Practitioner

## 2016-07-09 ENCOUNTER — Telehealth: Payer: Self-pay | Admitting: Nurse Practitioner

## 2016-07-09 ENCOUNTER — Encounter: Payer: Self-pay | Admitting: Nurse Practitioner

## 2016-07-09 VITALS — BP 124/76 | HR 76 | Ht 68.75 in | Wt 230.0 lb

## 2016-07-09 DIAGNOSIS — C50411 Malignant neoplasm of upper-outer quadrant of right female breast: Secondary | ICD-10-CM | POA: Diagnosis not present

## 2016-07-09 DIAGNOSIS — Z171 Estrogen receptor negative status [ER-]: Secondary | ICD-10-CM | POA: Diagnosis not present

## 2016-07-09 DIAGNOSIS — Z0000 Encounter for general adult medical examination without abnormal findings: Secondary | ICD-10-CM

## 2016-07-09 DIAGNOSIS — Z01411 Encounter for gynecological examination (general) (routine) with abnormal findings: Secondary | ICD-10-CM

## 2016-07-09 DIAGNOSIS — L819 Disorder of pigmentation, unspecified: Secondary | ICD-10-CM

## 2016-07-09 DIAGNOSIS — E559 Vitamin D deficiency, unspecified: Secondary | ICD-10-CM

## 2016-07-09 NOTE — Telephone Encounter (Signed)
Patient is notified at office visit today. Results reviewed by provider during visit.

## 2016-07-09 NOTE — Patient Instructions (Signed)

## 2016-07-09 NOTE — Telephone Encounter (Signed)
Please let pt know that BMD done on 07/01/16 shows a T Score at the spine of +2.10, right hip neck at -0.70; left hip neck at -0.40.  The lowest measurement is at the right hip which is still in the normal range.  Comparison study made from 07/05/13 shows a loss at the spine of -7%; right hip at -15%; left hip at -14%.  While some loss is normal we do not want to see accelerated bone loss.  Continue to walk, treadmill, and some weights as tolerated with her recent surgeries.

## 2016-07-09 NOTE — Progress Notes (Signed)
66 y.o. [id number] Married  Caucasian Fe here for annual exam.  Now has a recurrence of right breast cancer in February.  She had to change types of chemotherapy and now to have a repeat PET scan in July.  Now on Herceptin and other chemo.   Cardio eval and Echo was normal yesterday.  She had complex repair of chest wall -  revision of surgery scar.  She has been more tense over the new finding but now is trying to move forward.  She was eating more than usual and blood sugars were out of control -now getting better with diet.  Patient's last menstrual period was 10/22/2007.          Sexually active: Yes.    The current method of family planning is post menopausal status.    Exercising: Yes.  patient has started walking again. Smoker:  no  Health Maintenance: Pap: 07/05/14, Negative with neg HR HPV  06/04/11, Negative with neg HR HPV History of Abnormal Pap: no MMG: 11/02/15, 3D-yes, Density Category B, Bi-Rads 2:  Benign, Diagnostic Left Only Self Breast exams: yes Colonoscopy: 11/24/13, Diverticulosis, repeat in 5 years BMD: 07/01/16 T Score: +2.10 Spine / -0.70 Right Femur Neck / -0.40 Left Femur Neck TDaP: UTD with PCP Shingles: 2015 Pneumonia: 10/11/10 Pneumovax Hep C: 10/02/15 HIV: 12/19/14 Labs: PCP and Oncology   reports that she has never smoked. She has never used smokeless tobacco. She reports that she drinks alcohol. She reports that she does not use drugs.  Past Medical History:  Diagnosis Date  . Anemia    hx of  . Anxiety   . Arthritis    in fingers, shoulders  . Balance problem   . Breast cancer ([place])   . Breast cancer of upper-outer quadrant of right female breast ([place]) 07/22/2014  . Clumsiness   . Constipation   . Depression   . Diabetes mellitus without complication ([place])    56+ years  . Eating disorder    binge eating  . Epistaxis 08/26/2014  . Fatty liver   . HCAP (healthcare-associated pneumonia) 12/18/2014  . Headache   . Heart murmur    never had any problems   . Hyperlipidemia   . Hypertension   . Neuromuscular disorder ([place])    diabetic neuropathy  . Obesity   . Obstructive sleep apnea on CPAP    uses cpap nightly  . Ovarian cyst rupture    possible  . Pneumonia   . Pneumonia 11/2014  . Radiation 04/05/15-05/19/15   right chest wall 50.4 Gy, mastectomy scar 10 Gy  . Thyroid nodule 06/2014    Past Surgical History:  Procedure Laterality Date  . BIOPSY THYROID Bilateral 06/2014   2 nodules - Benign   . BREAST LUMPECTOMY WITH RADIOACTIVE SEED AND SENTINEL LYMPH NODE BIOPSY Right 12/27/2014   Procedure: BREAST LUMPECTOMY WITH RADIOACTIVE SEED AND SENTINEL LYMPH NODE BIOPSY;  Surgeon: Stark Klein, MD;  Location: [facility];  Service: General;  Laterality: Right;  . BREAST REDUCTION SURGERY Bilateral 01/06/2015   Procedure: BILATERAL BREAST REDUCTION, RIGHT ONCOPLASTIC RECONSTRUCTION),LEFT BREAST REDUCTION FOR SYMMETRY;  Surgeon: [name], MD;  Location: [facility];  Service: Plastics;  Laterality: Bilateral;  . BREATH TEK H PYLORI N/A 09/15/2012   Procedure: BREATH TEK H PYLORI;  Surgeon: Pedro Earls, MD;  Location: [facility];  Service: General;  Laterality: N/A;  . BUNIONECTOMY Left 12/2013  . CARPAL TUNNEL RELEASE Right   . CARPAL TUNNEL RELEASE Left   .  DUPUYTREN CONTRACTURE RELEASE    . GASTRIC ROUX-EN-Y N/A 11/02/2012   Procedure: LAPAROSCOPIC ROUX-EN-Y GASTRIC;  Surgeon: Pedro Earls, MD;  Location: [facility];  Service: General;  Laterality: N/A;  . IR GENERIC HISTORICAL  03/18/2016   IR US GUIDE VASC ACCESS LEFT 03/18/2016 Markus Daft, MD [facility]  . IR GENERIC HISTORICAL  03/18/2016   IR FLUORO GUIDE CV LINE LEFT 03/18/2016 Markus Daft, MD [facility]  . LAPAROSCOPIC APPENDECTOMY N/A 07/22/2015   Procedure: APPENDECTOMY LAPAROSCOPIC;  Surgeon: Michael Boston, MD;  Location: [facility];  Service: General;  Laterality: N/A;  . MASTECTOMY Right 02/15/2015   modified  . MODIFIED MASTECTOMY Right 02/15/2015    Procedure: RIGHT MODIFIED RADICAL MASTECTOMY;  Surgeon: Stark Klein, MD;  Location: [facility];  Service: General;  Laterality: Right;  . PORT-A-CATH REMOVAL Left 10/10/2015   Procedure: PORT REMOVAL;  Surgeon: Stark Klein, MD;  Location: [facility];  Service: General;  Laterality: Left;  . PORTACATH PLACEMENT Left 08/02/2014   Procedure: INSERTION PORT-A-CATH;  Surgeon: Stark Klein, MD;  Location: [facility];  Service: General;  Laterality: Left;  . ROTATOR CUFF REPAIR Right 2005  . SCAR REVISION Right 12/08/2015   Procedure: COMPLEX REPAIR RIGHT CHEST 10-15CM;  Surgeon: [name], MD;  Location: [facility];  Service: Plastics;  Laterality: Right;  . TOE SURGERY  1970s   bone spur  . TRIGGER FINGER RELEASE     x3    Current Outpatient Prescriptions  Medication Sig Dispense Refill  . acetaminophen (TYLENOL) 325 MG tablet Take 650 mg by mouth 2 (two) times daily as needed for moderate pain or headache. Reported on 04/17/2015    . calcium citrate-vitamin D (CITRACAL+D) 315-200 MG-UNIT tablet Take 1 tablet by mouth 3 (three) times daily.    . diphenhydrAMINE (BENADRYL) 25 MG tablet Take 25 mg by mouth daily as needed for allergies or sleep. Reported on 03/06/2015    . glucose blood (ONETOUCH VERIO) test strip 1 each by Other route as needed for other. Reported on 03/06/2015    . hydrochlorothiazide (HYDRODIURIL) 25 MG tablet Take 1 tablet (25 mg total) by mouth daily. 90 tablet 3  . losartan (COZAAR) 100 MG tablet Take 1 tablet by mouth daily. Reported on 03/06/2015    . Melatonin 5 MG TABS Take 5 mg by mouth at bedtime. Reported on 03/06/2015    . mometasone (NASONEX) 50 MCG/ACT nasal spray Place 1-2 sprays into the nose daily as needed (for allergies.).     [name] NOVOLIN 70/30 (70-30) 100 UNIT/ML injection Inject 25 Units into the skin 2 (two) times daily with a meal. Reported on 03/06/2015    . Pediatric Multivitamins-Iron (FLINTSTONES PLUS IRON) chewable tablet Chew 1  tablet by mouth 2 (two) times daily.    . Probiotic Product (CVS PROBIOTIC PO) Take 1 capsule by mouth daily. Reported on 03/06/2015    . Propylene Glycol (SYSTANE BALANCE OP) Apply 1-2 drops to eye 3 (three) times daily.    [name] senna-docusate (SENNA PLUS) 8.6-50 MG tablet Take 2 tablets by mouth at bedtime.    . terbinafine (LAMISIL) 250 MG tablet Take 250 mg by mouth every 30 (thirty) days.    . Vortioxetine HBr (TRINTELLIX) 20 MG TABS Take 20 mg by mouth at bedtime.      No current facility-administered medications for this visit.     Family History  Problem Relation Age of Onset  . CVA Mother   . Diabetes Father   .  Heart failure Father   . Hypertension Sister   . Diabetes Brother   . Breast cancer Paternal Grandmother   . Diabetes Paternal Uncle     ROS:  Pertinent items are noted in HPI.  Otherwise, a comprehensive ROS was negative.  Exam:   BP 124/76 (BP Location: Left Arm, Patient Position: Sitting, Cuff Size: Large)   Pulse 76   Ht 5' 8.75" (1.746 m)   Wt 230 lb (104.3 kg)   LMP 10/22/2007   BMI 34.21 kg/m  Height: 5' 8.75" (174.6 cm) Ht Readings from Last 3 Encounters:  07/09/16 5' 8.75" (1.746 m)  07/02/16 5\' 9"  (1.753 m)  05/10/16 5\' 9"  (1.753 m)    General appearance: alert, cooperative and appears stated age Head: Normocephalic, without obvious abnormality, atraumatic Neck: no adenopathy, supple, symmetrical, trachea midline and thyroid normal to inspection and palpation Lungs: clear to auscultation bilaterally Breasts: normal appearance, no masses or tenderness, on the left with breast reduction scars.  The entire right breast is surgically removed and the new excision area is not as thick and looks nicely done. Heart: regular rate and rhythm Abdomen: soft, non-tender; no masses,  no organomegaly Extremities: extremities normal, atraumatic, no cyanosis or edema Skin: Skin color, texture, turgor normal. No rashes or lesions.  There is a change of red area on  face that could be a basal cell cancer Lymph nodes: Cervical, supraclavicular, and axillary nodes normal. No abnormal inguinal nodes palpated Neurologic: Grossly normal   Pelvic: External genitalia:  no lesions              Urethra:  normal appearing urethra with no masses, tenderness or lesions              Bartholin's and Skene's: normal                 Vagina: normal appearing vagina with normal color and discharge, no lesions              Cervix: anteverted              Pap taken: No. Bimanual Exam:  Uterus:  normal size, contour, position, consistency, mobility, non-tender              Adnexa: no mass, fullness, tenderness               Rectovaginal: Confirms               Anus:  normal sphincter tone, no lesions  Chaperone present: yes  A:  Well Woman with normal exam  Postmenopausal - no HRT  Right breast cancer with lumpectomy and reduction on the left 12/16  S/P right mastectomy 01/2015  S/P Scar revision with complex repair and revision of scar on right 11/17  Recurrence of breast cancer - 2 areas on subpectoral lymph node  - back on chemo  Skin lesion on face with a change   P:   Reviewed health and wellness pertinent to exam  Pap smear: no  Mammogram is due on the left 10/18  Will recheck a Vit d level today.   Reviewed her BMD done on 6/111/18  Will get Derm apt scheduled for her.  Counseled on breast self exam, mammography screening, adequate intake of calcium and vitamin D, diet and exercise, Kegel's exercises return annually or prn  An After Visit Summary was printed and given to the patient.

## 2016-07-10 LAB — VITAMIN D 25 HYDROXY (VIT D DEFICIENCY, FRACTURES): Vit D, 25-Hydroxy: 34.8 ng/mL (ref 30.0–100.0)

## 2016-07-11 NOTE — Progress Notes (Signed)
Reviewed personally.  M. Suzanne Miller, MD.  

## 2016-07-12 ENCOUNTER — Ambulatory Visit: Payer: Commercial Managed Care - PPO

## 2016-07-18 ENCOUNTER — Telehealth: Payer: Self-pay | Admitting: Nurse Practitioner

## 2016-07-18 NOTE — Telephone Encounter (Signed)
Routing to Theresia Lo for f/u with dermatology referral.

## 2016-07-18 NOTE — Telephone Encounter (Signed)
Patient checking the status of a referral to a dermatologist.

## 2016-07-22 NOTE — Telephone Encounter (Signed)
Spoke with [facility]. They note referral received and working to schedule. Call to patient to notify status. She understood all information. Also provided her with contact number to [facility] - Dr. Renda Rolls.   Referral deferred for follow up with scheduling. Will close encounter.

## 2016-07-23 ENCOUNTER — Other Ambulatory Visit (HOSPITAL_BASED_OUTPATIENT_CLINIC_OR_DEPARTMENT_OTHER): Payer: Commercial Managed Care - PPO

## 2016-07-23 ENCOUNTER — Ambulatory Visit (HOSPITAL_BASED_OUTPATIENT_CLINIC_OR_DEPARTMENT_OTHER): Payer: Commercial Managed Care - PPO

## 2016-07-23 VITALS — BP 123/59 | HR 62 | Temp 97.9°F | Resp 20

## 2016-07-23 DIAGNOSIS — C773 Secondary and unspecified malignant neoplasm of axilla and upper limb lymph nodes: Principal | ICD-10-CM

## 2016-07-23 DIAGNOSIS — Z5112 Encounter for antineoplastic immunotherapy: Secondary | ICD-10-CM | POA: Diagnosis not present

## 2016-07-23 DIAGNOSIS — C50411 Malignant neoplasm of upper-outer quadrant of right female breast: Secondary | ICD-10-CM | POA: Diagnosis not present

## 2016-07-23 DIAGNOSIS — C50911 Malignant neoplasm of unspecified site of right female breast: Secondary | ICD-10-CM

## 2016-07-23 DIAGNOSIS — Z171 Estrogen receptor negative status [ER-]: Secondary | ICD-10-CM

## 2016-07-23 LAB — CBC WITH DIFFERENTIAL/PLATELET
BASO%: 0.8 % (ref 0.0–2.0)
Basophils Absolute: 0.1 10*3/uL (ref 0.0–0.1)
EOS%: 2.5 % (ref 0.0–7.0)
Eosinophils Absolute: 0.1 10*3/uL (ref 0.0–0.5)
HCT: 34.4 % — ABNORMAL LOW (ref 34.8–46.6)
HGB: 11.8 g/dL (ref 11.6–15.9)
LYMPH%: 22.4 % (ref 14.0–49.7)
MCH: 30.3 pg (ref 25.1–34.0)
MCHC: 34.4 g/dL (ref 31.5–36.0)
MCV: 87.9 fL (ref 79.5–101.0)
MONO#: 0.5 10*3/uL (ref 0.1–0.9)
MONO%: 7.6 % (ref 0.0–14.0)
NEUT#: 4 10*3/uL (ref 1.5–6.5)
NEUT%: 66.7 % (ref 38.4–76.8)
Platelets: 141 10*3/uL — ABNORMAL LOW (ref 145–400)
RBC: 3.91 10*6/uL (ref 3.70–5.45)
RDW: 15.2 % — ABNORMAL HIGH (ref 11.2–14.5)
WBC: 6 10*3/uL (ref 3.9–10.3)
lymph#: 1.3 10*3/uL (ref 0.9–3.3)

## 2016-07-23 LAB — COMPREHENSIVE METABOLIC PANEL
ALT: 34 U/L (ref 0–55)
AST: 37 U/L — ABNORMAL HIGH (ref 5–34)
Albumin: 3.4 g/dL — ABNORMAL LOW (ref 3.5–5.0)
Alkaline Phosphatase: 96 U/L (ref 40–150)
Anion Gap: 12 mEq/L — ABNORMAL HIGH (ref 3–11)
BUN: 38.1 mg/dL — ABNORMAL HIGH (ref 7.0–26.0)
CO2: 23 mEq/L (ref 22–29)
Calcium: 9.9 mg/dL (ref 8.4–10.4)
Chloride: 108 mEq/L (ref 98–109)
Creatinine: 1.4 mg/dL — ABNORMAL HIGH (ref 0.6–1.1)
EGFR: 38 mL/min/{1.73_m2} — ABNORMAL LOW (ref >90)
Glucose: 209 mg/dl — ABNORMAL HIGH (ref 70–140)
Potassium: 4.1 mEq/L (ref 3.5–5.1)
Sodium: 143 mEq/L (ref 136–145)
Total Bilirubin: 0.66 mg/dL (ref 0.20–1.20)
Total Protein: 7.1 g/dL (ref 6.4–8.3)

## 2016-07-23 MED ORDER — DIPHENHYDRAMINE HCL 25 MG PO CAPS
25.0000 mg | ORAL_CAPSULE | Freq: Once | ORAL | Status: AC
  Administered 2016-07-23: 25 mg via ORAL

## 2016-07-23 MED ORDER — ACETAMINOPHEN 325 MG PO TABS
ORAL_TABLET | ORAL | Status: AC
  Filled 2016-07-23: qty 2

## 2016-07-23 MED ORDER — ACETAMINOPHEN 325 MG PO TABS
650.0000 mg | ORAL_TABLET | Freq: Once | ORAL | Status: AC
  Administered 2016-07-23: 650 mg via ORAL

## 2016-07-23 MED ORDER — DIPHENHYDRAMINE HCL 25 MG PO CAPS
ORAL_CAPSULE | ORAL | Status: AC
  Filled 2016-07-23: qty 1

## 2016-07-23 MED ORDER — ADO-TRASTUZUMAB EMTANSINE CHEMO INJECTION 160 MG
3.4000 mg/kg | Freq: Once | INTRAVENOUS | Status: AC
  Administered 2016-07-23: 360 mg via INTRAVENOUS
  Filled 2016-07-23: qty 10

## 2016-07-23 MED ORDER — SODIUM CHLORIDE 0.9 % IV SOLN
Freq: Once | INTRAVENOUS | Status: AC
  Administered 2016-07-23: 09:00:00 via INTRAVENOUS

## 2016-07-23 NOTE — Patient Instructions (Signed)
[facility] Discharge Instructions for Patients Receiving Chemotherapy  Today you received the following chemotherapy agents Kadcycla   To help prevent nausea and vomiting after your treatment, we encourage you to take your nausea medication as directed.    If you develop nausea and vomiting that is not controlled by your nausea medication, call the clinic.   BELOW ARE SYMPTOMS THAT SHOULD BE REPORTED IMMEDIATELY:  *FEVER GREATER THAN 100.5 F  *CHILLS WITH OR WITHOUT FEVER  NAUSEA AND VOMITING THAT IS NOT CONTROLLED WITH YOUR NAUSEA MEDICATION  *UNUSUAL SHORTNESS OF BREATH  *UNUSUAL BRUISING OR BLEEDING  TENDERNESS IN MOUTH AND THROAT WITH OR WITHOUT PRESENCE OF ULCERS  *URINARY PROBLEMS  *BOWEL PROBLEMS  UNUSUAL RASH Items with * indicate a potential emergency and should be followed up as soon as possible.  Feel free to call the clinic you have any questions or concerns. The clinic phone number is [phone].  Please show the [facility] at check-in to the Emergency Department and triage nurse.

## 2016-07-31 ENCOUNTER — Encounter (INDEPENDENT_AMBULATORY_CARE_PROVIDER_SITE_OTHER): Payer: Commercial Managed Care - PPO | Admitting: Family Medicine

## 2016-07-31 DIAGNOSIS — Z0289 Encounter for other administrative examinations: Secondary | ICD-10-CM

## 2016-08-06 ENCOUNTER — Encounter: Payer: Self-pay | Admitting: Nurse Practitioner

## 2016-08-09 ENCOUNTER — Other Ambulatory Visit (INDEPENDENT_AMBULATORY_CARE_PROVIDER_SITE_OTHER): Payer: Self-pay | Admitting: Family Medicine

## 2016-08-09 ENCOUNTER — Encounter (INDEPENDENT_AMBULATORY_CARE_PROVIDER_SITE_OTHER): Payer: Self-pay | Admitting: Family Medicine

## 2016-08-09 ENCOUNTER — Ambulatory Visit (INDEPENDENT_AMBULATORY_CARE_PROVIDER_SITE_OTHER): Payer: Commercial Managed Care - PPO | Admitting: Family Medicine

## 2016-08-09 VITALS — BP 132/72 | HR 77 | Temp 98.5°F | Resp 12 | Ht 69.0 in | Wt 225.0 lb

## 2016-08-09 DIAGNOSIS — R0609 Other forms of dyspnea: Secondary | ICD-10-CM

## 2016-08-09 DIAGNOSIS — R5383 Other fatigue: Secondary | ICD-10-CM

## 2016-08-09 DIAGNOSIS — R0600 Dyspnea, unspecified: Secondary | ICD-10-CM

## 2016-08-09 DIAGNOSIS — E119 Type 2 diabetes mellitus without complications: Secondary | ICD-10-CM | POA: Diagnosis not present

## 2016-08-09 DIAGNOSIS — Z1389 Encounter for screening for other disorder: Secondary | ICD-10-CM

## 2016-08-09 DIAGNOSIS — Z6833 Body mass index (BMI) 33.0-33.9, adult: Secondary | ICD-10-CM

## 2016-08-09 DIAGNOSIS — E669 Obesity, unspecified: Secondary | ICD-10-CM

## 2016-08-09 DIAGNOSIS — I519 Heart disease, unspecified: Secondary | ICD-10-CM

## 2016-08-09 DIAGNOSIS — Z794 Long term (current) use of insulin: Secondary | ICD-10-CM

## 2016-08-09 DIAGNOSIS — Z1331 Encounter for screening for depression: Secondary | ICD-10-CM

## 2016-08-09 DIAGNOSIS — I5189 Other ill-defined heart diseases: Secondary | ICD-10-CM

## 2016-08-10 LAB — CBC WITH DIFFERENTIAL
Basophils Absolute: 0 10*3/uL (ref 0.0–0.2)
Basos: 0 %
EOS (ABSOLUTE): 0.1 10*3/uL (ref 0.0–0.4)
Eos: 2 %
Hematocrit: 34.5 % (ref 34.0–46.6)
Hemoglobin: 11.3 g/dL (ref 11.1–15.9)
Immature Grans (Abs): 0 10*3/uL (ref 0.0–0.1)
Immature Granulocytes: 0 %
Lymphocytes Absolute: 1.5 10*3/uL (ref 0.7–3.1)
Lymphs: 24 %
MCH: 29.1 pg (ref 26.6–33.0)
MCHC: 32.8 g/dL (ref 31.5–35.7)
MCV: 89 fL (ref 79–97)
Monocytes Absolute: 0.5 10*3/uL (ref 0.1–0.9)
Monocytes: 8 %
Neutrophils Absolute: 4 10*3/uL (ref 1.4–7.0)
Neutrophils: 66 %
RBC: 3.88 x10E6/uL (ref 3.77–5.28)
RDW: 15.3 % (ref 12.3–15.4)
WBC: 6.1 10*3/uL (ref 3.4–10.8)

## 2016-08-10 LAB — COMPREHENSIVE METABOLIC PANEL
ALT: 39 IU/L — ABNORMAL HIGH (ref 0–32)
AST: 47 IU/L — ABNORMAL HIGH (ref 0–40)
Albumin/Globulin Ratio: 1.4 (ref 1.2–2.2)
Albumin: 4 g/dL (ref 3.6–4.8)
Alkaline Phosphatase: 110 IU/L (ref 39–117)
BUN/Creatinine Ratio: 20 (ref 12–28)
BUN: 26 mg/dL (ref 8–27)
Bilirubin Total: 0.6 mg/dL (ref 0.0–1.2)
CO2: 24 mmol/L (ref 20–29)
Calcium: 9.7 mg/dL (ref 8.7–10.3)
Chloride: 104 mmol/L (ref 96–106)
Creatinine, Ser: 1.31 mg/dL — ABNORMAL HIGH (ref 0.57–1.00)
GFR calc Af Amer: 50 mL/min/{1.73_m2} — ABNORMAL LOW (ref >59)
GFR calc non Af Amer: 43 mL/min/{1.73_m2} — ABNORMAL LOW (ref >59)
Globulin, Total: 2.8 g/dL (ref 1.5–4.5)
Glucose: 151 mg/dL — ABNORMAL HIGH (ref 65–99)
Potassium: 4 mmol/L (ref 3.5–5.2)
Sodium: 143 mmol/L (ref 134–144)
Total Protein: 6.8 g/dL (ref 6.0–8.5)

## 2016-08-10 LAB — LIPID PANEL WITH LDL/HDL RATIO
Cholesterol, Total: 182 mg/dL (ref 100–199)
HDL: 62 mg/dL (ref >39)
LDL Calculated: 85 mg/dL (ref 0–99)
LDl/HDL Ratio: 1.4 ratio (ref 0.0–3.2)
Triglycerides: 176 mg/dL — ABNORMAL HIGH (ref 0–149)
VLDL Cholesterol Cal: 35 mg/dL (ref 5–40)

## 2016-08-10 LAB — FOLATE: Folate: >20 ng/mL (ref >3.0)

## 2016-08-10 LAB — HEMOGLOBIN A1C
Est. average glucose Bld gHb Est-mCnc: 186 mg/dL
Hgb A1c MFr Bld: 8.1 % — ABNORMAL HIGH (ref 4.8–5.6)

## 2016-08-10 LAB — TSH: TSH: 2.17 u[IU]/mL (ref 0.450–4.500)

## 2016-08-10 LAB — VITAMIN B12: Vitamin B-12: >2000 pg/mL — ABNORMAL HIGH (ref 232–1245)

## 2016-08-10 LAB — C-PEPTIDE: C-Peptide: 4.5 ng/mL — ABNORMAL HIGH (ref 1.1–4.4)

## 2016-08-10 LAB — T4, FREE: Free T4: 1.12 ng/dL (ref 0.82–1.77)

## 2016-08-10 LAB — VITAMIN D 25 HYDROXY (VIT D DEFICIENCY, FRACTURES): Vit D, 25-Hydroxy: 35.4 ng/mL (ref 30.0–100.0)

## 2016-08-10 LAB — T3: T3, Total: 90 ng/dL (ref 71–180)

## 2016-08-10 LAB — INSULIN, RANDOM: INSULIN: 18.3 u[IU]/mL (ref 2.6–24.9)

## 2016-08-12 NOTE — Progress Notes (Addendum)
[facility]  [phone]) [phone] Fax:(336) [phone]   ID: EMANUEL CAMPOS DOB: November 02, 1950  MR#: [id number]  ASN#:[id number]  Patient Care Team: Vernie Shanks, MD as PCP - General (Family Medicine) Himmelrich, Bryson Ha, RD (Inactive) as Dietitian (Bariatrics) Stark Klein, MD as Consulting Physician (General Surgery) Magrinat, Virgie Dad, MD as Consulting Physician (Oncology) Arloa Koh, MD as Consulting Physician (Radiation Oncology) Mauro Kaufmann, RN as Registered Nurse Rockwell [place], RN as Registered Nurse Jacelyn Pi, MD as Consulting Physician (Endocrinology) Kem Boroughs, [place] as Nurse Practitioner (Family Medicine) [name], MD as Consulting Physician (Plastic Surgery) Larey Dresser, MD as Consulting Physician (Cardiology) PCP: Vernie Shanks, MD OTHER MD:  CHIEF COMPLAINT:  HER-2 positive, estrogen receptor negative locally recurrent disease  CURRENT TREATMENT: Completing T-DM1, transitioning to trastuzumab  INTERVAL HISTORY:  Darlene [name] returns today for follow-up and treatment of her recurrent HER-2 positive breast cancer accompanied by her husband. Today she will receive the eighth of 8 planned cycles of T-DM 1, to be followed by trastuzumab alone.  She generally tolerates treatments remarkably well. She continues to work full time. Her most recent echocardiogram was 07/08/2016 with an excellent ejection fraction.  REVIEW OF SYSTEMS: She tells me her blood sugars are not under much better control. She still has problems with arthritis, occasional headaches, anxiety and depression. A detailed review of systems was otherwise stable  BREAST CANCER HISTORY: From the original intake note:  "Darlene [name]" had screening mammography at her gynecologist suggestive of a possible abnormality in the right breast. However right diagnostic mammogram 04/12/2013 at [facility] was negative. More recently however, she noted a lump in her right breast and  brought it to her gynecologist's attention. On 07/18/2014 the patient had bilateral diagnostic mammography with tomosynthesis at [facility]. The breast density was category B. In the right breast upper outer quadrant there was an area of focal asymmetry with indistinct margins.[name] Ultrasound was obtained and showed in addition to the mass in question, measuring 1.7 cm, an abnormal-appearing lymph node in the right axillary tail.  On 07/19/2014 the patient underwent biopsy of the right breast massin question as well as a right axillary lymph node. Both were positive for invasive ductal carcinoma, grade 2, estrogen and progesterone receptor negative, with an MIB-1 of 90%, and HER-2 pending. Incidentally the lymph node biopsy showed a lymphocytic inflammatory component but no lymph node architecture.  Her subsequent history is as detailed below.  PAST MEDICAL HISTORY: Past Medical History:  Diagnosis Date  . Anemia    hx of  . Anxiety   . Arthritis    in fingers, shoulders  . Back pain   . Balance problem   . Breast cancer ([place])   . Breast cancer of upper-outer quadrant of right female breast ([place]) 07/22/2014  . Bunion, right foot   . Clumsiness   . Constipation   . Depression   . Diabetes mellitus without complication ([place])    19+ years  . Eating disorder    binge eating  . Epistaxis 08/26/2014  . Fatty liver   . HCAP (healthcare-associated pneumonia) 12/18/2014  . Headache   . Heart murmur    never had any problems  . Hyperlipidemia   . Hypertension   . Joint pain   . Multiple food allergies   . Neuromuscular disorder ([place])    diabetic neuropathy  . Obesity   . Obstructive sleep apnea on CPAP    uses cpap nightly  . Ovarian cyst  rupture    possible  . Pneumonia   . Pneumonia 11/2014  . Radiation 04/05/15-05/19/15   right chest wall 50.4 Gy, mastectomy scar 10 Gy  . Sleep apnea   . Thyroid nodule 06/2014    PAST SURGICAL HISTORY: Past Surgical History:  Procedure Laterality Date    . BIOPSY THYROID Bilateral 06/2014   2 nodules - Benign   . BREAST LUMPECTOMY WITH RADIOACTIVE SEED AND SENTINEL LYMPH NODE BIOPSY Right 12/27/2014   Procedure: BREAST LUMPECTOMY WITH RADIOACTIVE SEED AND SENTINEL LYMPH NODE BIOPSY;  Surgeon: Stark Klein, MD;  Location: [facility];  Service: General;  Laterality: Right;  . BREAST REDUCTION SURGERY Bilateral 01/06/2015   Procedure: BILATERAL BREAST REDUCTION, RIGHT ONCOPLASTIC RECONSTRUCTION),LEFT BREAST REDUCTION FOR SYMMETRY;  Surgeon: [name], MD;  Location: [facility];  Service: Plastics;  Laterality: Bilateral;  . BREATH TEK H PYLORI N/A 09/15/2012   Procedure: BREATH TEK H PYLORI;  Surgeon: Pedro Earls, MD;  Location: [facility];  Service: General;  Laterality: N/A;  . BUNIONECTOMY Left 12/2013  . CARPAL TUNNEL RELEASE Right   . CARPAL TUNNEL RELEASE Left   . DUPUYTREN CONTRACTURE RELEASE    . GASTRIC ROUX-EN-Y N/A 11/02/2012   Procedure: LAPAROSCOPIC ROUX-EN-Y GASTRIC;  Surgeon: Pedro Earls, MD;  Location: [facility];  Service: General;  Laterality: N/A;  . IR GENERIC HISTORICAL  03/18/2016   IR US GUIDE VASC ACCESS LEFT 03/18/2016 Markus Daft, MD [facility]  . IR GENERIC HISTORICAL  03/18/2016   IR FLUORO GUIDE CV LINE LEFT 03/18/2016 Markus Daft, MD [facility]  . LAPAROSCOPIC APPENDECTOMY N/A 07/22/2015   Procedure: APPENDECTOMY LAPAROSCOPIC;  Surgeon: Michael Boston, MD;  Location: [facility];  Service: General;  Laterality: N/A;  . MASTECTOMY Right 02/15/2015   modified  . MODIFIED MASTECTOMY Right 02/15/2015   Procedure: RIGHT MODIFIED RADICAL MASTECTOMY;  Surgeon: Stark Klein, MD;  Location: [facility];  Service: General;  Laterality: Right;  . PORT-A-CATH REMOVAL Left 10/10/2015   Procedure: PORT REMOVAL;  Surgeon: Stark Klein, MD;  Location: [facility];  Service: General;  Laterality: Left;  . PORTACATH PLACEMENT Left 08/02/2014   Procedure: INSERTION PORT-A-CATH;  Surgeon: Stark Klein, MD;  Location: [facility];  Service: General;  Laterality: Left;  . ROTATOR CUFF REPAIR Right 2005  . SCAR REVISION Right 12/08/2015   Procedure: COMPLEX REPAIR RIGHT CHEST 10-15CM;  Surgeon: [name], MD;  Location: [facility];  Service: Plastics;  Laterality: Right;  . TOE SURGERY  1970s   bone spur  . TRIGGER FINGER RELEASE     x3    FAMILY HISTORY Family History  Problem Relation Age of Onset  . CVA Mother   . Heart disease Mother   . Sudden death Mother   . Diabetes Father   . Heart failure Father   . Hypertension Father   . Kidney disease Father   . Obesity Father   . Hypertension Sister   . Diabetes Brother   . Breast cancer Paternal Grandmother   . Diabetes Paternal Uncle    the patient's father died from complications of diabetes at age 67. The patient's mother died at age 65 with a subarachnoid hemorrhage. The patient had one brother, one sister. The only breast cancer was the patient's paternal grandmother diagnosed in her 34s. There is no history of ovarian cancer in the family.  GYNECOLOGIC HISTORY:  Patient's last menstrual period was 10/22/2007.  menarche age 24, the patient is GX P0.  She went through the change of life in 2011. She did not take hormone replacement.  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 Her husband Darlene [name] is a retired Visual merchandiser (he taught at [employer]).  Darlene [name] has 2 children from an earlier marriage, Darlene [name] who teaches in [place], and Darlene [name],  who works at the [employer] in in [place]. He has 1 grand son, aged 65. The patient and her husband are not church attender's    ADVANCED DIRECTIVES:  Not in place   HEALTH MAINTENANCE: Social History  Substance Use Topics  . Smoking status: Never Smoker  . Smokeless tobacco: Never Used  . Alcohol use 0.0 oz/week     Comment: social     Colonoscopy:  May 2016  PAP:  Bone density:  07/05/2013 normal, with a T score of 0.4  Lipid panel:  Allergies  Allergen Reactions  . Nsaids Other (See Comments)    H/o gastric bypass - avoid NSAIDs  . Peanuts [Peanut Oil] Itching    "Large amounts"  . Tape Hives and Rash    Adhesives in bandaids    Current Outpatient Prescriptions  Medication Sig Dispense Refill  . acetaminophen (TYLENOL) 325 MG tablet Take 650 mg by mouth 2 (two) times daily as needed for moderate pain or headache. Reported on 04/17/2015    . calcium citrate-vitamin D (CITRACAL+D) 315-200 MG-UNIT tablet Take 1 tablet by mouth 3 (three) times daily.    . cholecalciferol (VITAMIN D) 1000 units tablet Take 1,000 Units by mouth daily.    . diphenhydrAMINE (BENADRYL) 25 MG tablet Take 25 mg by mouth daily as needed for allergies or sleep. Reported on 03/06/2015    . glucose blood (ONETOUCH VERIO) test strip 1 each by Other route as needed for other. Reported on 03/06/2015    . hydrochlorothiazide (HYDRODIURIL) 25 MG tablet Take 1 tablet (25 mg total) by mouth daily. 90 tablet 3  . losartan (COZAAR) 100 MG tablet Take 1 tablet by mouth daily. Reported on 03/06/2015    . Melatonin 5 MG TABS Take 5 mg by mouth at bedtime. Reported on 03/06/2015    . mometasone (NASONEX) 50 MCG/ACT nasal spray Place 1-2 sprays into the nose daily as needed (for allergies.).     [name] NOVOLIN 70/30 (70-30) 100 UNIT/ML injection Inject 25 Units into the skin 2 (two) times daily with a meal. Reported on 03/06/2015    . Pediatric Multivitamins-Iron (FLINTSTONES PLUS IRON) chewable tablet Chew 1 tablet by mouth 2 (two) times daily.    . Probiotic Product ([employer] PROBIOTIC PO) Take 1 capsule by mouth daily. Reported on 03/06/2015    . Propylene Glycol (SYSTANE BALANCE OP) Apply 1-2 drops to eye 3 (three) times daily.    [name] senna-docusate (SENNA PLUS) 8.6-50 MG tablet Take 2 tablets by mouth at bedtime.    . terbinafine (LAMISIL) 250 MG tablet Take 250 mg by mouth every 30 (thirty) days.    . Vortioxetine HBr  (TRINTELLIX) 20 MG TABS Take 20 mg by mouth at bedtime.      No current facility-administered medications for this visit.     OBJECTIVE: Middle-aged white woman In no acute distress  Vitals:   08/13/16 0859  BP: (!) 122/49  Pulse: 72  Resp: 18  Temp: 98.2 F (36.8 C)     Body mass index is 33.11 kg/m.    ECOG FS:1 - Symptomatic but completely ambulatory  Filed Weights   08/13/16 0859  Weight: 224 lb 3.2 oz (101.7 kg)   Sclerae unicteric, pupils round and equal Oropharynx clear and moist No cervical or supraclavicular adenopathy Lungs no rales or rhonchi Heart regular rate and rhythm Abd soft, nontender, positive bowel sounds MSK no focal spinal tenderness, no upper extremity lymphedema Neuro: nonfocal, well oriented, appropriate affect Breasts: Deferred   LAB RESULTS:  CMP     Component Value Date/Time   NA 141 08/13/2016 0840   K 3.9 08/13/2016 0840   CL 105 12/08/2015 1028   CO2 22 08/13/2016 0840   GLUCOSE 193 (H) 08/13/2016 0840   BUN 40.3 (H) 08/13/2016 0840   CREATININE 1.5 (H) 08/13/2016 0840   CALCIUM 9.7 08/13/2016 0840   PROT 7.0 08/13/2016 0840   ALBUMIN 3.5 08/13/2016 0840   AST 40 (H) 08/13/2016 0840   ALT 33 08/13/2016 0840   ALKPHOS 101 08/13/2016 0840   BILITOT 0.82 08/13/2016 0840   GFRNONAA 51 (L) 10/10/2015 1007   GFRNONAA 55 (L) 02/18/2013 0827   GFRAA 59 (L) 10/10/2015 1007   GFRAA 63 02/18/2013 0827    INo results found for: SPEP, UPEP  Lab Results  Component Value Date   WBC 5.2 08/13/2016   NEUTROABS 3.3 08/13/2016   HGB 12.0 08/13/2016   HCT 35.0 08/13/2016   MCV 87.9 08/13/2016   PLT 122 (L) 08/13/2016      Chemistry      Component Value Date/Time   NA 141 08/13/2016 0840   K 3.9 08/13/2016 0840   CL 105 12/08/2015 1028   CO2 22 08/13/2016 0840   BUN 40.3 (H) 08/13/2016 0840   CREATININE 1.5 (H) 08/13/2016 0840      Component Value Date/Time   CALCIUM 9.7 08/13/2016 0840   ALKPHOS 101 08/13/2016 0840   AST 40  (H) 08/13/2016 0840   ALT 33 08/13/2016 0840   BILITOT 0.82 08/13/2016 0840       No results found for: LABCA2  No components found for: [id number]  No results for input(s): INR in the last 168 hours.  Urinalysis    Component Value Date/Time   COLORURINE YELLOW 07/21/2015 [place] 07/21/2015 2328   LABSPEC 1.020 07/21/2015 2328   PHURINE 6.0 07/21/2015 2328   GLUCOSEU NEGATIVE 07/21/2015 2328   HGBUR TRACE (A) 07/21/2015 2328   BILIRUBINUR NEGATIVE 07/21/2015 2328   BILIRUBINUR neg 06/29/2013 1017   KETONESUR NEGATIVE 07/21/2015 2328   PROTEINUR 30 (A) 07/21/2015 2328   UROBILINOGEN negative 06/29/2013 1017   NITRITE NEGATIVE 07/21/2015 2328   LEUKOCYTESUR NEGATIVE 07/21/2015 2328    STUDIES: Bone density at [facility] 07/01/2016 showed a T score of -0.7 (normal). ASSESSMENT: 66 y.o. [place] woman status post right breast upper outer quadrant and right axillary lymph node biopsy 07/19/2014 for a cT2  pN1, stage IIB  invasive ductal carcinoma, grade 2, estrogen and progesterone receptor negative, with an MIB-1 of 90%, and HER-2 amplified  (1) neoadjuvant chemotherapy started 08/08/2014, consisting of carboplatin, docetaxel, trastuzumab and pertuzumab given every 3 weeks 6, completed 11/21/2014  (a) breast MRI after initial 3 cycles shows a significant response  (b) breast MRI after 6 cycles shows a complete radiologic response   (2) trastuzumab continued through 10/02/2015 (to end of capecitabine therapy)  (a) echo 02/13/2015 shows an EF of 60-65%  (b)  Echo 05/09/2015 shows an ejection fraction of 60-65%  (c) echocardiogram 06/27/2015 shows an ejection fraction of 55-60%.  (3) right lumpectomy with sentinel  lymph node biopsy on 12/27/14 showed a residual  ypT2 ypN1a, stage IIB invasive ductal carcinoma, grade 2, with repeat estrogen and progesterone receptors again negative  (4) status post right oncoplastic surgery (with left reduction mammoplasty)  01/06/2015 showing in the right breast multiple foci of intralymphatic carcinoma in random sections  (5)  Right modified radical mastectomy 02/15/2014 showed no residual invasive disease; there was DCIS but margins were negative; all 15 axillary lymph nodes were clear; ypTis ypN0  (a)  The patient has decided against reconstruction.  (6)  Postmastectomy radiation completed 05/18/2015 with capecitabine sensitization  (7) adjuvant capecitabine continued through 09/27/2015  (8) hepatic steatosis with increased fibrosis score (at risk for cirrhosis)  (9) thyroid biopsy (left lobe inferiorly) 2 shows benign tissue 06/28/2015  (10) LOCAL RECURRENCE:  (a) PET scan 12/06/2015 Notes a right retropectoral lymph node measuring 0.9 cm with an SUV max of 5.5  (b) repeat PET scan 03/11/2016 finds the right subpectoral lymph node to now measure 1.3 cm with an SUV max of 11.8.  (c) PET scan 05/07/2016 shows a significant response to T-DM 1  (11) started T-DM1 03/21/2016  (a) repeat echocardiogram 07/08/2016 shows an ejection fraction of 60-65%.  PLAN: Darlene [name] is completing her 8 planned cycles of T-DM 1 today. She already has a PET scan scheduled within the next 2 weeks. She is going to return to see me 09/03/2016 to discuss results.  The plan is to start trastuzumab alone at that time and to continue that for at least one or 2 years.  She is considering having a port put back in 4 convenience.  As far as echocardiograms are concerned she really does not need one until September. We will get that set up for her.  If we still have measurable disease I will refer her to radiation oncology. Otherwise we will simply continue the trastuzumab and restage at the end of the year.  She has a good understanding of this plan. She knows to call for any problems that may develop before the next visit here.   Chauncey Cruel, MD   08/13/2016 9:59 PM

## 2016-08-12 NOTE — Progress Notes (Signed)
Office: [phone]  /  Fax: [phone]   Dear Dr. Hassell Done,   Thank you for referring Darlene [name] to our clinic. The following note includes my evaluation and treatment recommendations.  HPI:   Chief Complaint: OBESITY    Darlene [name] has been referred by Isabel Caprice. Hassell Done, MD for consultation regarding her obesity and obesity related comorbidities.    Darlene [name] (MR# [id number]) is a 66 y.o. female who presents on 08/12/2016 for obesity evaluation and treatment. Current BMI is Body mass index is 33.23 kg/m.Darlene [name] had Gastric Bypass in 2015. Darlene [name]'s heaviest pre-operative weight was 292 lbs and she lost down to [place] kept this weight off for about 2 years before starting to regain weight and has now regained 22 lbs from their lowest postoperative weight.     Darlene [name] attended our information session and states she is currently in the action stage of change and ready to dedicate time achieving and maintaining a healthier weight. Darlene [name] requests to join our [place] program to help manage their weight and relearn how to use their weight loss surgery to achieve improved health.    Darlene [name] states her family eats meals together she thinks her family will eat healthier with  her she struggles with family and or coworkers weight loss sabotage her desired weight loss is 55 lbs she has been heavy most of  her life her heaviest weight ever was 332 lbs. she has significant food cravings issues  she is frequently drinking liquids with calories she frequently makes poor food choices she frequently eats larger portions than normal  she has binge eating behaviors she struggles with emotional eating    Fatigue Darlene [name] feels her energy is lower than it should be. This has worsened with weight gain and has not worsened recently. Daionna admits to daytime somnolence and  admits to waking up still tired. Patient is at risk for obstructive sleep apnea.  Patent has a history of symptoms of daytime fatigue, morning fatigue and morning headache. Patient generally gets 7 or 8 hours of sleep per night, and states they generally have restful sleep. Snoring is not present with CPAP. Apneic episodes are not present. Epworth Sleepiness Score is 6  Dyspnea on exertion Darlene [name] notes increasing shortness of breath with exercising and seems to be worsening over time with weight gain. She notes getting out of breath sooner with activity than she used to. This has not gotten worse recently. Darlene [name] denies orthopnea.  Diabetes II with complications on insulin Darlene [name] has a diagnosis of diabetes type II, her last A1c was elevated to 8.0 and she has increased Novolin to 25 units bid. Ellina denies any hypoglycemic episodes. She is attempting to work on intensive lifestyle modifications including diet, exercise, and weight loss to help control her blood glucose levels.  Grade I diastolic dysfunction Darlene [name] is followed by cardiology, EKG showed 1st degree blockage. She denies chest pain. Wavie had echocardiogram in 2018 showing grade I diastolic dysfunction with ejection fraction 60 to 65%.   Depression Screen Darlene [name] Food and Mood (modified PHQ-9) score was  Depression screen PHQ 2/9 08/09/2016  Decreased Interest 1  Down, Depressed, Hopeless 1  PHQ - 2 Score 2  Altered sleeping 0  Tired, decreased energy 1  Change in appetite 2  Feeling bad or failure about yourself  2  Trouble concentrating 1  Moving slowly or fidgety/restless 1  Suicidal thoughts 0  PHQ-9 Score 9  Some recent data might be hidden  ALLERGIES: Allergies  Allergen Reactions   Nsaids Other (See Comments)    H/o gastric bypass - avoid NSAIDs   Peanuts [Peanut Oil] Itching    "Large amounts"   Tape Hives and Rash    Adhesives in bandaids    MEDICATIONS: Current Outpatient Prescriptions on File Prior to Visit  Medication Sig Dispense Refill   acetaminophen  (TYLENOL) 325 MG tablet Take 650 mg by mouth 2 (two) times daily as needed for moderate pain or headache. Reported on 04/17/2015     calcium citrate-vitamin D (CITRACAL+D) 315-200 MG-UNIT tablet Take 1 tablet by mouth 3 (three) times daily.     cholecalciferol (VITAMIN D) 1000 units tablet Take 1,000 Units by mouth daily.     diphenhydrAMINE (BENADRYL) 25 MG tablet Take 25 mg by mouth daily as needed for allergies or sleep. Reported on 03/06/2015     glucose blood (ONETOUCH VERIO) test strip 1 each by Other route as needed for other. Reported on 03/06/2015     hydrochlorothiazide (HYDRODIURIL) 25 MG tablet Take 1 tablet (25 mg total) by mouth daily. 90 tablet 3   losartan (COZAAR) 100 MG tablet Take 1 tablet by mouth daily. Reported on 03/06/2015     Melatonin 5 MG TABS Take 5 mg by mouth at bedtime. Reported on 03/06/2015     mometasone (NASONEX) 50 MCG/ACT nasal spray Place 1-2 sprays into the nose daily as needed (for allergies.).      NOVOLIN 70/30 (70-30) 100 UNIT/ML injection Inject 25 Units into the skin 2 (two) times daily with a meal. Reported on 03/06/2015     Pediatric Multivitamins-Iron (FLINTSTONES PLUS IRON) chewable tablet Chew 1 tablet by mouth 2 (two) times daily.     Probiotic Product ([employer] PROBIOTIC PO) Take 1 capsule by mouth daily. Reported on 03/06/2015     Propylene Glycol (SYSTANE BALANCE OP) Apply 1-2 drops to eye 3 (three) times daily.     senna-docusate (SENNA PLUS) 8.6-50 MG tablet Take 2 tablets by mouth at bedtime.     terbinafine (LAMISIL) 250 MG tablet Take 250 mg by mouth every 30 (thirty) days.     Vortioxetine HBr (TRINTELLIX) 20 MG TABS Take 20 mg by mouth at bedtime.      No current facility-administered medications on file prior to visit.     PAST MEDICAL HISTORY: Past Medical History:  Diagnosis Date   Anemia    hx of   Anxiety    Arthritis    in fingers, shoulders   Back pain    Balance problem    Breast cancer ([place])    Breast  cancer of upper-outer quadrant of right female breast ([place]) 07/22/2014   Bunion, right foot    Clumsiness    Constipation    Depression    Diabetes mellitus without complication ([place])    16+ years   Eating disorder    binge eating   Epistaxis 08/26/2014   Fatty liver    HCAP (healthcare-associated pneumonia) 12/18/2014   Headache    Heart murmur    never had any problems   Hyperlipidemia    Hypertension    Joint pain    Multiple food allergies    Neuromuscular disorder ([place])    diabetic neuropathy   Obesity    Obstructive sleep apnea on CPAP    uses cpap nightly   Ovarian cyst rupture    possible   Pneumonia    Pneumonia 11/2014   Radiation 04/05/15-05/19/15   right chest wall 50.4  Gy, mastectomy scar 10 Gy   Sleep apnea    Thyroid nodule 06/2014    PAST SURGICAL HISTORY: Past Surgical History:  Procedure Laterality Date   BIOPSY THYROID Bilateral 06/2014   2 nodules - Benign    BREAST LUMPECTOMY WITH RADIOACTIVE SEED AND SENTINEL LYMPH NODE BIOPSY Right 12/27/2014   Procedure: BREAST LUMPECTOMY WITH RADIOACTIVE SEED AND SENTINEL LYMPH NODE BIOPSY;  Surgeon: Stark Klein, MD;  Location: [facility];  Service: General;  Laterality: Right;   BREAST REDUCTION SURGERY Bilateral 01/06/2015   Procedure: BILATERAL BREAST REDUCTION, RIGHT ONCOPLASTIC RECONSTRUCTION),LEFT BREAST REDUCTION FOR SYMMETRY;  Surgeon: [name], MD;  Location: [name];  Service: Plastics;  Laterality: Bilateral;   BREATH TEK H PYLORI N/A 09/15/2012   Procedure: BREATH TEK H PYLORI;  Surgeon: Pedro Earls, MD;  Location: [facility];  Service: General;  Laterality: N/A;   BUNIONECTOMY Left 12/2013   CARPAL TUNNEL RELEASE Right    CARPAL TUNNEL RELEASE Left    DUPUYTREN CONTRACTURE RELEASE     GASTRIC ROUX-EN-Y N/A 11/02/2012   Procedure: LAPAROSCOPIC ROUX-EN-Y GASTRIC;  Surgeon: Pedro Earls, MD;  Location: [facility];  Service: General;  Laterality:  N/A;   IR GENERIC HISTORICAL  03/18/2016   IR US GUIDE VASC ACCESS LEFT 03/18/2016 Markus Daft, MD [facility]   IR GENERIC HISTORICAL  03/18/2016   IR FLUORO GUIDE CV LINE LEFT 03/18/2016 Markus Daft, MD [facility]   LAPAROSCOPIC APPENDECTOMY N/A 07/22/2015   Procedure: APPENDECTOMY LAPAROSCOPIC;  Surgeon: Michael Boston, MD;  Location: [facility];  Service: General;  Laterality: N/A;   MASTECTOMY Right 02/15/2015   modified   MODIFIED MASTECTOMY Right 02/15/2015   Procedure: RIGHT MODIFIED RADICAL MASTECTOMY;  Surgeon: Stark Klein, MD;  Location: [facility];  Service: General;  Laterality: Right;   PORT-A-CATH REMOVAL Left 10/10/2015   Procedure: PORT REMOVAL;  Surgeon: Stark Klein, MD;  Location: [facility];  Service: General;  Laterality: Left;   PORTACATH PLACEMENT Left 08/02/2014   Procedure: INSERTION PORT-A-CATH;  Surgeon: Stark Klein, MD;  Location: [facility];  Service: General;  Laterality: Left;   ROTATOR CUFF REPAIR Right 2005   SCAR REVISION Right 12/08/2015   Procedure: COMPLEX REPAIR RIGHT CHEST 10-15CM;  Surgeon: [name], MD;  Location: [facility];  Service: Plastics;  Laterality: Right;   TOE SURGERY  1970s   bone spur   TRIGGER FINGER RELEASE     x3    SOCIAL HISTORY: Social History  Substance Use Topics   Smoking status: Never Smoker   Smokeless tobacco: Never Used   Alcohol use 0.0 oz/week     Comment: social    FAMILY HISTORY: Family History  Problem Relation Age of Onset   CVA Mother    Heart disease Mother    Sudden death Mother    Diabetes Father    Heart failure Father    Hypertension Father    Kidney disease Father    Obesity Father    Hypertension Sister    Diabetes Brother    Breast cancer Paternal Grandmother    Diabetes Paternal Uncle     ROS: Review of Systems  Constitutional: Positive for malaise/fatigue.  HENT: Positive for congestion (nasal stuffiness), hearing loss, sinus pain and  tinnitus.   Eyes:       Wear Glasses or Contacts  Respiratory: Positive for shortness of breath (on exertion).   Cardiovascular: Negative for orthopnea.  Musculoskeletal:       Neck  Lumps Muscle or Joint Pain Muscle Stiffness  Skin:       Dryness   Neurological: Positive for headaches.  Psychiatric/Behavioral: Positive for depression.    PHYSICAL EXAM: Blood pressure 132/72, pulse 77, temperature 98.5 F (36.9 C), temperature source Oral, resp. rate 12, height 5\' 9"  (1.753 m), weight 225 lb (102.1 kg), last menstrual period 10/22/2007, SpO2 96 %. Body mass index is 33.23 kg/m. Physical Exam  Constitutional: She is oriented to person, place, and time. She appears well-developed and well-nourished.  Pulmonary/Chest: Effort normal.  Musculoskeletal: Normal range of motion.  Neurological: She is oriented to person, place, and time.  Skin: Skin is warm and dry.  Psychiatric: She has a normal mood and affect. Her behavior is normal.  Vitals reviewed.   RECENT LABS AND TESTS: BMET    Component Value Date/Time   NA 143 07/23/2016 0812   K 4.1 07/23/2016 0812   CL 105 12/08/2015 1028   CO2 23 07/23/2016 0812   GLUCOSE 209 (H) 07/23/2016 0812   BUN 38.1 (H) 07/23/2016 0812   CREATININE 1.4 (H) 07/23/2016 0812   CALCIUM 9.9 07/23/2016 0812   GFRNONAA 51 (L) 10/10/2015 1007   GFRNONAA 55 (L) 02/18/2013 0827   GFRAA 59 (L) 10/10/2015 1007   GFRAA 63 02/18/2013 0827   Lab Results  Component Value Date   HGBA1C 6.5 (H) 01/03/2015   No results found for: INSULIN CBC    Component Value Date/Time   WBC 6.0 07/23/2016 0812   WBC 5.4 10/10/2015 1007   RBC 3.91 07/23/2016 0812   RBC 3.13 (L) 10/10/2015 1007   HGB 11.8 07/23/2016 0812   HCT 34.4 (L) 07/23/2016 0812   PLT 141 (L) 07/23/2016 0812   MCV 87.9 07/23/2016 0812   MCH 30.3 07/23/2016 0812   MCH 33.5 10/10/2015 1007   MCHC 34.4 07/23/2016 0812   MCHC 33.0 10/10/2015 1007   RDW 15.2 (H) 07/23/2016 0812    LYMPHSABS 1.3 07/23/2016 0812   MONOABS 0.5 07/23/2016 0812   EOSABS 0.1 07/23/2016 0812   BASOSABS 0.1 07/23/2016 0812   Iron/TIBC/Ferritin/ %Sat    Component Value Date/Time   IRON 58 02/18/2013 0827   TIBC 305 02/18/2013 0827   FERRITIN 133 10/31/2014 0816   IRONPCTSAT 19 (L) 02/18/2013 0827   Lipid Panel     Component Value Date/Time   CHOL 115 02/18/2013 0827   TRIG 115 02/18/2013 0827   HDL 40 02/18/2013 0827   CHOLHDL 2.9 02/18/2013 0827   VLDL 23 02/18/2013 0827   LDLCALC 52 02/18/2013 0827   Hepatic Function Panel     Component Value Date/Time   PROT 7.1 07/23/2016 0812   ALBUMIN 3.4 (L) 07/23/2016 0812   AST 37 (H) 07/23/2016 0812   ALT 34 07/23/2016 0812   ALKPHOS 96 07/23/2016 0812   BILITOT 0.66 07/23/2016 0812   No results found for: TSH  ECG  shows NSR with a rate of 74 BPM INDIRECT CALORIMETER done today shows a VO2 of 281 and a REE of 1954. Her calculated basal metabolic rate is 0626 thus her basal metabolic rate is better than expected.    ASSESSMENT AND PLAN: Other fatigue - Plan: EKG 12-Lead, Vitamin B12, CBC With Differential, Comprehensive metabolic panel, Folate, Lipid Panel With LDL/HDL Ratio, T3, T4, free, TSH, VITAMIN D 25 Hydroxy (Vit-D Deficiency, Fractures)  Dyspnea on exertion  Type 2 diabetes mellitus without complication, with long-term current use of insulin (HCC) - Plan: Comprehensive metabolic panel, C-peptide, Hemoglobin A1c,  Insulin, random  Diastolic dysfunction  Depression screening  Class 1 obesity with serious comorbidity and body mass index (BMI) of 33.0 to 33.9 in adult, unspecified obesity type  PLAN: Fatigue Darlene [name] was informed that her fatigue may be related to obesity, depression or many other causes. Labs will be ordered, and in the meanwhile Darlene [name] has agreed to work on diet, exercise and weight loss to help with fatigue. Proper sleep hygiene was discussed including the need for 7-8 hours of quality sleep  each night. A sleep study was not ordered based on symptoms and Epworth score.  Dyspnea on exertion Darlene [name]'s shortness of breath appears to be obesity related and exercise induced. She has agreed to work on weight loss and gradually increase exercise to treat her exercise induced shortness of breath. If Darlene [name] follows our instructions and loses weight without improvement of her shortness of breath, we will plan to refer to pulmonology. We will monitor this condition regularly. Darlene [name] agrees to this plan.  Diabetes II with complications on insulin Darlene [name] has been given extensive diabetes education by myself today including ideal fasting and post-prandial blood glucose readings, individual ideal Hgb A1c goals  and hypoglycemia prevention. We discussed the importance of good blood sugar control to decrease the likelihood of diabetic complications such as nephropathy, neuropathy, limb loss, blindness, coronary artery disease, and death. We discussed the importance of intensive lifestyle modification including diet, exercise and weight loss as the first line treatment for diabetes. Darlene [name] agrees to decrease Novolin 70/30 to 20 units bid and decrease insulin to 15 units if BGs drop below 100 and she will follow up at the agreed upon time.  Grade I diastolic dysfunction Darlene [name] has agreed to work on diet, exercise and weight loss to help improve cardiac function and decrease risk of additional problems.  Depression Screen Darlene [name] had a mildly positive depression screening. Depression is commonly associated with obesity and often results in emotional eating behaviors. We will monitor this closely and work on CBT to help improve the non-hunger eating patterns. Referral to Psychology may be required if no improvement is seen as she continues in our clinic.  Obesity Darlene [name] is currently in the action stage of change and her goal is to continue with weight loss efforts. I recommend Darlene [name] begin  the structured treatment plan as follows:  She has agreed to keep a food journal with 1300 to 1500 calories and 80 grams of protein  Darlene [name] has been instructed to eventually work up to a goal of 150 minutes of combined cardio and strengthening exercise per week for weight loss and overall health benefits. We discussed the following Behavioral Modification Strategies today: increasing lean protein intake and keep a strict food journal  Mayana has agreed to join our [employer] and follow up with our clinic in 1 week. She was informed of the importance of frequent follow up visits to maximize her success with intensive lifestyle modifications for her multiple health conditions. She was informed we would discuss her lab results at her next visit unless there is a critical issue that needs to be addressed sooner. Makailey agreed to keep her next visit at the agreed upon time to discuss these results.  I, Doreene Nest, am acting as transcriptionist for Dennard Nip, MD  I have reviewed the above documentation for accuracy and completeness, and I agree with the above. -Dennard Nip, MD   OBESITY BEHAVIORAL INTERVENTION VISIT  Today's visit was # 1 out of 73.  Starting weight: 225 lbs  Starting date: 08/09/16 Today's weight : 225 lbs  Today's date: 08/09/2016 Total lbs lost to date: 0 (Patients must lose 7 lbs in the first 6 months to continue with counseling)   ASK: We discussed the diagnosis of obesity with Darlene [name] today and Darlene [name] agreed to give [name] permission to discuss obesity behavioral modification therapy today.  ASSESS: Neely has the diagnosis of obesity and her BMI today is 33.3 Camarie is in the action stage of change   ADVISE: Kashlyn was educated on the multiple health risks of obesity as well as the benefit of weight loss to improve her health. She was advised of the need for long term treatment and the importance of lifestyle  modifications.  AGREE: Multiple dietary modification options and treatment options were discussed and  Milaina agreed to keep a food journal with 1300 to 1500 calories and 80 grams of protein  We discussed the following Behavioral Modification Strategies today: increasing lean protein intake and keep a strict food journal.

## 2016-08-13 ENCOUNTER — Ambulatory Visit (HOSPITAL_BASED_OUTPATIENT_CLINIC_OR_DEPARTMENT_OTHER): Payer: Commercial Managed Care - PPO

## 2016-08-13 ENCOUNTER — Other Ambulatory Visit (HOSPITAL_BASED_OUTPATIENT_CLINIC_OR_DEPARTMENT_OTHER): Payer: Commercial Managed Care - PPO

## 2016-08-13 ENCOUNTER — Ambulatory Visit (HOSPITAL_BASED_OUTPATIENT_CLINIC_OR_DEPARTMENT_OTHER): Payer: Commercial Managed Care - PPO | Admitting: Oncology

## 2016-08-13 VITALS — BP 122/49 | HR 72 | Temp 98.2°F | Resp 18 | Ht 69.0 in | Wt 224.2 lb

## 2016-08-13 DIAGNOSIS — C773 Secondary and unspecified malignant neoplasm of axilla and upper limb lymph nodes: Secondary | ICD-10-CM

## 2016-08-13 DIAGNOSIS — C50911 Malignant neoplasm of unspecified site of right female breast: Secondary | ICD-10-CM

## 2016-08-13 DIAGNOSIS — Z5112 Encounter for antineoplastic immunotherapy: Secondary | ICD-10-CM | POA: Diagnosis not present

## 2016-08-13 DIAGNOSIS — C50411 Malignant neoplasm of upper-outer quadrant of right female breast: Secondary | ICD-10-CM

## 2016-08-13 DIAGNOSIS — Z171 Estrogen receptor negative status [ER-]: Secondary | ICD-10-CM | POA: Diagnosis not present

## 2016-08-13 LAB — COMPREHENSIVE METABOLIC PANEL
ALT: 33 U/L (ref 0–55)
AST: 40 U/L — ABNORMAL HIGH (ref 5–34)
Albumin: 3.5 g/dL (ref 3.5–5.0)
Alkaline Phosphatase: 101 U/L (ref 40–150)
Anion Gap: 12 mEq/L — ABNORMAL HIGH (ref 3–11)
BUN: 40.3 mg/dL — ABNORMAL HIGH (ref 7.0–26.0)
CO2: 22 mEq/L (ref 22–29)
Calcium: 9.7 mg/dL (ref 8.4–10.4)
Chloride: 107 mEq/L (ref 98–109)
Creatinine: 1.5 mg/dL — ABNORMAL HIGH (ref 0.6–1.1)
EGFR: 37 mL/min/{1.73_m2} — ABNORMAL LOW (ref >90)
Glucose: 193 mg/dl — ABNORMAL HIGH (ref 70–140)
Potassium: 3.9 mEq/L (ref 3.5–5.1)
Sodium: 141 mEq/L (ref 136–145)
Total Bilirubin: 0.82 mg/dL (ref 0.20–1.20)
Total Protein: 7 g/dL (ref 6.4–8.3)

## 2016-08-13 LAB — CBC WITH DIFFERENTIAL/PLATELET
BASO%: 0.7 % (ref 0.0–2.0)
Basophils Absolute: 0 10*3/uL (ref 0.0–0.1)
EOS%: 2.6 % (ref 0.0–7.0)
Eosinophils Absolute: 0.1 10*3/uL (ref 0.0–0.5)
HCT: 35 % (ref 34.8–46.6)
HGB: 12 g/dL (ref 11.6–15.9)
LYMPH%: 24.7 % (ref 14.0–49.7)
MCH: 30.1 pg (ref 25.1–34.0)
MCHC: 34.3 g/dL (ref 31.5–36.0)
MCV: 87.9 fL (ref 79.5–101.0)
MONO#: 0.4 10*3/uL (ref 0.1–0.9)
MONO%: 7.5 % (ref 0.0–14.0)
NEUT#: 3.3 10*3/uL (ref 1.5–6.5)
NEUT%: 64.5 % (ref 38.4–76.8)
Platelets: 122 10*3/uL — ABNORMAL LOW (ref 145–400)
RBC: 3.98 10*6/uL (ref 3.70–5.45)
RDW: 15.4 % — ABNORMAL HIGH (ref 11.2–14.5)
WBC: 5.2 10*3/uL (ref 3.9–10.3)
lymph#: 1.3 10*3/uL (ref 0.9–3.3)

## 2016-08-13 MED ORDER — DIPHENHYDRAMINE HCL 25 MG PO CAPS
ORAL_CAPSULE | ORAL | Status: AC
  Filled 2016-08-13: qty 1

## 2016-08-13 MED ORDER — ACETAMINOPHEN 325 MG PO TABS
ORAL_TABLET | ORAL | Status: AC
  Filled 2016-08-13: qty 2

## 2016-08-13 MED ORDER — ACETAMINOPHEN 325 MG PO TABS
650.0000 mg | ORAL_TABLET | Freq: Once | ORAL | Status: AC
  Administered 2016-08-13: 650 mg via ORAL

## 2016-08-13 MED ORDER — SODIUM CHLORIDE 0.9 % IV SOLN
360.0000 mg | Freq: Once | INTRAVENOUS | Status: AC
  Administered 2016-08-13: 360 mg via INTRAVENOUS
  Filled 2016-08-13: qty 8

## 2016-08-13 MED ORDER — DIPHENHYDRAMINE HCL 25 MG PO CAPS
25.0000 mg | ORAL_CAPSULE | Freq: Once | ORAL | Status: AC
  Administered 2016-08-13: 25 mg via ORAL

## 2016-08-13 MED ORDER — SODIUM CHLORIDE 0.9 % IV SOLN
Freq: Once | INTRAVENOUS | Status: AC
  Administered 2016-08-13: 10:00:00 via INTRAVENOUS

## 2016-08-13 NOTE — Patient Instructions (Signed)
[facility] Discharge Instructions for Patients Receiving Chemotherapy  Today you received the following chemotherapy agents: Kadcyla  To help prevent nausea and vomiting after your treatment, we encourage you to take your nausea medication as directed.    If you develop nausea and vomiting that is not controlled by your nausea medication, call the clinic.   BELOW ARE SYMPTOMS THAT SHOULD BE REPORTED IMMEDIATELY:  *FEVER GREATER THAN 100.5 F  *CHILLS WITH OR WITHOUT FEVER  NAUSEA AND VOMITING THAT IS NOT CONTROLLED WITH YOUR NAUSEA MEDICATION  *UNUSUAL SHORTNESS OF BREATH  *UNUSUAL BRUISING OR BLEEDING  TENDERNESS IN MOUTH AND THROAT WITH OR WITHOUT PRESENCE OF ULCERS  *URINARY PROBLEMS  *BOWEL PROBLEMS  UNUSUAL RASH Items with * indicate a potential emergency and should be followed up as soon as possible.  Feel free to call the clinic you have any questions or concerns. The clinic phone number is [phone].  Please show the CHEMO ALERT CARD at check-in to the Emergency Department and triage nurse.   

## 2016-08-13 NOTE — Progress Notes (Signed)
Pt states she does not want to stay the 30 minutes post Kadclya.  Pt educated on importance of staying but still wants to be discharged home.

## 2016-08-14 ENCOUNTER — Encounter (INDEPENDENT_AMBULATORY_CARE_PROVIDER_SITE_OTHER): Payer: Self-pay | Admitting: Family Medicine

## 2016-08-16 ENCOUNTER — Ambulatory Visit (INDEPENDENT_AMBULATORY_CARE_PROVIDER_SITE_OTHER): Payer: Commercial Managed Care - PPO | Admitting: Family Medicine

## 2016-08-16 ENCOUNTER — Encounter (INDEPENDENT_AMBULATORY_CARE_PROVIDER_SITE_OTHER): Payer: Self-pay | Admitting: Family Medicine

## 2016-08-16 VITALS — BP 114/68 | HR 76 | Temp 98.0°F | Ht 69.0 in | Wt 219.0 lb

## 2016-08-16 DIAGNOSIS — E1142 Type 2 diabetes mellitus with diabetic polyneuropathy: Secondary | ICD-10-CM

## 2016-08-16 DIAGNOSIS — E860 Dehydration: Secondary | ICD-10-CM | POA: Diagnosis not present

## 2016-08-16 DIAGNOSIS — Z794 Long term (current) use of insulin: Secondary | ICD-10-CM | POA: Diagnosis not present

## 2016-08-16 DIAGNOSIS — E669 Obesity, unspecified: Secondary | ICD-10-CM | POA: Diagnosis not present

## 2016-08-16 DIAGNOSIS — Z6832 Body mass index (BMI) 32.0-32.9, adult: Secondary | ICD-10-CM | POA: Diagnosis not present

## 2016-08-16 MED ORDER — INSULIN PEN NEEDLE 32G X 4 MM MISC: 1.0000 | Freq: Once | 0 refills | Status: AC

## 2016-08-16 MED ORDER — INSULIN GLARGINE 100 UNIT/ML SOLOSTAR PEN: 25.0000 [IU] | PEN_INJECTOR | Freq: Every day | SUBCUTANEOUS | 0 refills | Status: DC

## 2016-08-19 ENCOUNTER — Encounter (HOSPITAL_COMMUNITY)
Admission: RE | Admit: 2016-08-19 | Discharge: 2016-08-19 | Disposition: A | Discharge status: Home / Self Care | Payer: Commercial Managed Care - PPO | Source: Ambulatory Visit | Attending: Oncology | Admitting: Oncology

## 2016-08-19 DIAGNOSIS — C50411 Malignant neoplasm of upper-outer quadrant of right female breast: Secondary | ICD-10-CM | POA: Insufficient documentation

## 2016-08-19 DIAGNOSIS — C50911 Malignant neoplasm of unspecified site of right female breast: Secondary | ICD-10-CM | POA: Diagnosis present

## 2016-08-19 DIAGNOSIS — C773 Secondary and unspecified malignant neoplasm of axilla and upper limb lymph nodes: Secondary | ICD-10-CM | POA: Insufficient documentation

## 2016-08-19 DIAGNOSIS — Z171 Estrogen receptor negative status [ER-]: Secondary | ICD-10-CM | POA: Insufficient documentation

## 2016-08-19 LAB — GLUCOSE, CAPILLARY: Glucose-Capillary: 186 mg/dL — ABNORMAL HIGH (ref 65–99)

## 2016-08-19 MED ORDER — FLUDEOXYGLUCOSE F - 18 (FDG) INJECTION
10.8000 | Freq: Once | INTRAVENOUS | Status: AC | PRN
  Administered 2016-08-19: 10.8 via INTRAVENOUS

## 2016-08-19 NOTE — Progress Notes (Signed)
Office: [phone]  /  Fax: [phone]   HPI:   Chief Complaint: OBESITY Darlene [name] is here to discuss her progress with her obesity treatment plan. She is on the  keep a food journal with 1300 to 1500 calories and 80 grams of protein  and is following her eating plan approximately 90 to 95 % of the time. She states she is walking 5,000 steps per day 5 times per week. Darlene [name] has done very well with weight loss and journaling. She struggled to eat all her protein, her hunger is controlled and she requests higher protein options. Her weight is 219 lb (99.3 kg) today and has had a weight loss of 6 pounds over a period of 1 week since her last visit. She has lost 6 lbs since starting treatment with [name].  Diabetes II with Nephropathy Darlene [name] has a diagnosis of diabetes type II with nephropathy. She is on 70/30 insulin and Chaunda states BGs range between 108 and 154 and but she had a low. She has been working on intensive lifestyle modifications including diet, exercise, and weight loss to help control her blood glucose levels.  Dehydration Darlene [name]'s BUN and creatinine are elevated and she has a history of diabetic nephropathy but she is not drinking much H2O.  ALLERGIES: Allergies  Allergen Reactions   Nsaids Other (See Comments)    H/o gastric bypass - avoid NSAIDs   Peanuts [Peanut Oil] Itching    "Large amounts"   Tape Hives and Rash    Adhesives in bandaids    MEDICATIONS: Current Outpatient Prescriptions on File Prior to Visit  Medication Sig Dispense Refill   acetaminophen (TYLENOL) 325 MG tablet Take 650 mg by mouth 2 (two) times daily as needed for moderate pain or headache. Reported on 04/17/2015     calcium citrate-vitamin D (CITRACAL+D) 315-200 MG-UNIT tablet Take 1 tablet by mouth 3 (three) times daily.     cholecalciferol (VITAMIN D) 1000 units tablet Take 1,000 Units by mouth daily.     diphenhydrAMINE (BENADRYL) 25 MG tablet Take 25 mg by mouth daily as  needed for allergies or sleep. Reported on 03/06/2015     glucose blood (ONETOUCH VERIO) test strip 1 each by Other route as needed for other. Reported on 03/06/2015     hydrochlorothiazide (HYDRODIURIL) 25 MG tablet Take 1 tablet (25 mg total) by mouth daily. 90 tablet 3   losartan (COZAAR) 100 MG tablet Take 1 tablet by mouth daily. Reported on 03/06/2015     Melatonin 5 MG TABS Take 5 mg by mouth at bedtime. Reported on 03/06/2015     mometasone (NASONEX) 50 MCG/ACT nasal spray Place 1-2 sprays into the nose daily as needed (for allergies.).      Pediatric Multivitamins-Iron (FLINTSTONES PLUS IRON) chewable tablet Chew 1 tablet by mouth 2 (two) times daily.     Probiotic Product ([employer] PROBIOTIC PO) Take 1 capsule by mouth daily. Reported on 03/06/2015     Propylene Glycol (SYSTANE BALANCE OP) Apply 1-2 drops to eye 3 (three) times daily.     senna-docusate (SENNA PLUS) 8.6-50 MG tablet Take 2 tablets by mouth at bedtime.     terbinafine (LAMISIL) 250 MG tablet Take 250 mg by mouth every 30 (thirty) days.     Vortioxetine HBr (TRINTELLIX) 20 MG TABS Take 20 mg by mouth at bedtime.      No current facility-administered medications on file prior to visit.     PAST MEDICAL HISTORY: Past Medical History:  Diagnosis Date  Anemia    hx of   Anxiety    Arthritis    in fingers, shoulders   Back pain    Balance problem    Breast cancer (HCC)    Breast cancer of upper-outer quadrant of right female breast ([place]) 07/22/2014   Bunion, right foot    Clumsiness    Constipation    Depression    Diabetes mellitus without complication ([place])    63+ years   Eating disorder    binge eating   Epistaxis 08/26/2014   Fatty liver    HCAP (healthcare-associated pneumonia) 12/18/2014   Headache    Heart murmur    never had any problems   Hyperlipidemia    Hypertension    Joint pain    Multiple food allergies    Neuromuscular disorder ([place])    diabetic neuropathy    Obesity    Obstructive sleep apnea on CPAP    uses cpap nightly   Ovarian cyst rupture    possible   Pneumonia    Pneumonia 11/2014   Radiation 04/05/15-05/19/15   right chest wall 50.4 Gy, mastectomy scar 10 Gy   Sleep apnea    Thyroid nodule 06/2014    PAST SURGICAL HISTORY: Past Surgical History:  Procedure Laterality Date   BIOPSY THYROID Bilateral 06/2014   2 nodules - Benign    BREAST LUMPECTOMY WITH RADIOACTIVE SEED AND SENTINEL LYMPH NODE BIOPSY Right 12/27/2014   Procedure: BREAST LUMPECTOMY WITH RADIOACTIVE SEED AND SENTINEL LYMPH NODE BIOPSY;  Surgeon: Stark Klein, MD;  Location: [facility];  Service: General;  Laterality: Right;   BREAST REDUCTION SURGERY Bilateral 01/06/2015   Procedure: BILATERAL BREAST REDUCTION, RIGHT ONCOPLASTIC RECONSTRUCTION),LEFT BREAST REDUCTION FOR SYMMETRY;  Surgeon: [name], MD;  Location: [facility];  Service: Plastics;  Laterality: Bilateral;   BREATH TEK H PYLORI N/A 09/15/2012   Procedure: BREATH TEK H PYLORI;  Surgeon: Pedro Earls, MD;  Location: [facility];  Service: General;  Laterality: N/A;   BUNIONECTOMY Left 12/2013   CARPAL TUNNEL RELEASE Right    CARPAL TUNNEL RELEASE Left    DUPUYTREN CONTRACTURE RELEASE     GASTRIC ROUX-EN-Y N/A 11/02/2012   Procedure: LAPAROSCOPIC ROUX-EN-Y GASTRIC;  Surgeon: Pedro Earls, MD;  Location: [facility];  Service: General;  Laterality: N/A;   IR GENERIC HISTORICAL  03/18/2016   IR US GUIDE VASC ACCESS LEFT 03/18/2016 Markus Daft, MD [facility]   IR GENERIC HISTORICAL  03/18/2016   IR FLUORO GUIDE CV LINE LEFT 03/18/2016 Markus Daft, MD [facility]   LAPAROSCOPIC APPENDECTOMY N/A 07/22/2015   Procedure: APPENDECTOMY LAPAROSCOPIC;  Surgeon: Michael Boston, MD;  Location: [facility];  Service: General;  Laterality: N/A;   MASTECTOMY Right 02/15/2015   modified   MODIFIED MASTECTOMY Right 02/15/2015   Procedure: RIGHT MODIFIED RADICAL MASTECTOMY;  Surgeon: Stark Klein, MD;  Location: [facility];  Service: General;  Laterality: Right;   PORT-A-CATH REMOVAL Left 10/10/2015   Procedure: PORT REMOVAL;  Surgeon: Stark Klein, MD;  Location: [facility];  Service: General;  Laterality: Left;   PORTACATH PLACEMENT Left 08/02/2014   Procedure: INSERTION PORT-A-CATH;  Surgeon: Stark Klein, MD;  Location: [facility];  Service: General;  Laterality: Left;   ROTATOR CUFF REPAIR Right 2005   SCAR REVISION Right 12/08/2015   Procedure: COMPLEX REPAIR RIGHT CHEST 10-15CM;  Surgeon: [name], MD;  Location: [facility];  Service: Plastics;  Laterality: Right;   TOE SURGERY  1970s  bone spur   TRIGGER FINGER RELEASE     x3    SOCIAL HISTORY: Social History  Substance Use Topics   Smoking status: Never Smoker   Smokeless tobacco: Never Used   Alcohol use 0.0 oz/week     Comment: social    FAMILY HISTORY: Family History  Problem Relation Age of Onset   CVA Mother    Heart disease Mother    Sudden death Mother    Diabetes Father    Heart failure Father    Hypertension Father    Kidney disease Father    Obesity Father    Hypertension Sister    Diabetes Brother    Breast cancer Paternal Grandmother    Diabetes Paternal Uncle     ROS: Review of Systems  Constitutional: Positive for weight loss.  Endo/Heme/Allergies:       Hypoglycemia    PHYSICAL EXAM: Blood pressure 114/68, pulse 76, temperature 98 F (36.7 C), temperature source Oral, height 5\' 9"  (1.753 m), weight 219 lb (99.3 kg), last menstrual period 10/22/2007, SpO2 97 %. Body mass index is 32.34 kg/m. Physical Exam  Constitutional: She is oriented to person, place, and time. She appears well-developed and well-nourished.  Cardiovascular: Normal rate.   Pulmonary/Chest: Effort normal.  Musculoskeletal: Normal range of motion.  Neurological: She is oriented to person, place, and time.  Skin: Skin is warm and dry.  Psychiatric: She has  a normal mood and affect. Her behavior is normal.  Vitals reviewed.   RECENT LABS AND TESTS: BMET    Component Value Date/Time   NA 141 08/13/2016 0840   K 3.9 08/13/2016 0840   CL 104 08/09/2016 1229   CO2 22 08/13/2016 0840   GLUCOSE 193 (H) 08/13/2016 0840   BUN 40.3 (H) 08/13/2016 0840   CREATININE 1.5 (H) 08/13/2016 0840   CALCIUM 9.7 08/13/2016 0840   GFRNONAA 43 (L) 08/09/2016 1229   GFRNONAA 55 (L) 02/18/2013 0827   GFRAA 50 (L) 08/09/2016 1229   GFRAA 63 02/18/2013 0827   Lab Results  Component Value Date   HGBA1C 8.1 (H) 08/09/2016   HGBA1C 6.5 (H) 01/03/2015   HGBA1C 6.9 (H) 02/18/2013   HGBA1C 7.7 (H) 11/02/2012   Lab Results  Component Value Date   INSULIN 18.3 08/09/2016   CBC    Component Value Date/Time   WBC 5.2 08/13/2016 0840   WBC 5.4 10/10/2015 1007   RBC 3.98 08/13/2016 0840   RBC 3.13 (L) 10/10/2015 1007   HGB 12.0 08/13/2016 0840   HCT 35.0 08/13/2016 0840   PLT 122 (L) 08/13/2016 0840   MCV 87.9 08/13/2016 0840   MCH 30.1 08/13/2016 0840   MCH 33.5 10/10/2015 1007   MCHC 34.3 08/13/2016 0840   MCHC 33.0 10/10/2015 1007   RDW 15.4 (H) 08/13/2016 0840   LYMPHSABS 1.3 08/13/2016 0840   MONOABS 0.4 08/13/2016 0840   EOSABS 0.1 08/13/2016 0840   EOSABS 0.1 08/09/2016 1229   BASOSABS 0.0 08/13/2016 0840   Iron/TIBC/Ferritin/ %Sat    Component Value Date/Time   IRON 58 02/18/2013 0827   TIBC 305 02/18/2013 0827   FERRITIN 133 10/31/2014 0816   IRONPCTSAT 19 (L) 02/18/2013 0827   Lipid Panel     Component Value Date/Time   CHOL 182 08/09/2016 1229   TRIG 176 (H) 08/09/2016 1229   HDL 62 08/09/2016 1229   CHOLHDL 2.9 02/18/2013 0827   VLDL 23 02/18/2013 0827   LDLCALC 85 08/09/2016 1229   Hepatic Function Panel  Component Value Date/Time   PROT 7.0 08/13/2016 0840   ALBUMIN 3.5 08/13/2016 0840   AST 40 (H) 08/13/2016 0840   ALT 33 08/13/2016 0840   ALKPHOS 101 08/13/2016 0840   BILITOT 0.82 08/13/2016 0840        Component Value Date/Time   TSH 2.170 08/09/2016 1229    ASSESSMENT AND PLAN: Type 2 diabetes mellitus with diabetic polyneuropathy, with long-term current use of insulin ([place]) - Plan: Insulin Glargine (LANTUS SOLOSTAR) 100 UNIT/ML Solostar Pen, Insulin Pen Needle 32G X 4 MM MISC  Dehydration  Class 1 obesity with serious comorbidity and body mass index (BMI) of 32.0 to 32.9 in adult, unspecified obesity type  PLAN:  Diabetes II with Nephropathy Alease has been given extensive diabetes education by myself today including ideal fasting and post-prandial blood glucose readings, individual ideal HgA1c goals  and hypoglycemia prevention. We discussed the importance of good blood sugar control to decrease the likelihood of diabetic complications such as nephropathy, neuropathy, limb loss, blindness, coronary artery disease, and death. We discussed the importance of intensive lifestyle modification including diet, exercise and weight loss as the first line treatment for diabetes. Christean agrees to discontinue 70/30 insulin and start Lantus 25 units qhs x 30 days and pen needles and will follow up at the agreed upon time.  Dehydration Taniesha agrees to increase her H2O intake to at least 80 ounces per day and follow up with our clinic as needed.  Obesity Laylia is currently in the action stage of change. As such, her goal is to continue with weight loss efforts She has agreed to keep a food journal with 1300 to 1500 calories and 80+ grams of protein  Mallissa has been instructed to work up to a goal of 150 minutes of combined cardio and strengthening exercise per week for weight loss and overall health benefits. We discussed the following Behavioral Modification Strategies today: increasing lean protein intake, increase H2O intake and better snacking choices  Isma has agreed to follow up with our clinic as needed as she continues her weight loss journey. She was informed of the  importance of frequent follow up visits to maximize her success with intensive lifestyle modifications for her multiple health conditions.  I, Doreene Nest, am acting as transcriptionist for Dennard Nip, MD  I have reviewed the above documentation for accuracy and completeness, and I agree with the above. -Dennard Nip, MD   OBESITY BEHAVIORAL INTERVENTION VISIT  Today's visit was # 2 out of 8.  Starting weight: 225 lbs Starting date: 08/09/16 Today's weight : 219 lbs Today's date: 08/16/2016 Total lbs lost to date: 6 (Patients must lose 7 lbs in the first 6 months to continue with counseling)   ASK: We discussed the diagnosis of obesity with [name] today and Enriqueta agreed to give [name] permission to discuss obesity behavioral modification therapy today.  ASSESS: Lucas has the diagnosis of obesity and her BMI today is 32.4 Shadana is in the action stage of change   ADVISE: Shanay was educated on the multiple health risks of obesity as well as the benefit of weight loss to improve her health. She was advised of the need for long term treatment and the importance of lifestyle modifications.  AGREE: Multiple dietary modification options and treatment options were discussed and  Gwendolyne agreed to keep a food journal with 1300 to 1500 calories and 80+ grams of protein  We discussed the following Behavioral Modification Strategies today: increasing lean protein intake, increase  H2O intake and better snacking choices

## 2016-08-20 MED ORDER — BASAGLAR KWIKPEN 100 UNIT/ML ~~LOC~~ SOPN: 25.0000 [IU] | PEN_INJECTOR | Freq: Every day | SUBCUTANEOUS | 0 refills | Status: DC

## 2016-08-22 ENCOUNTER — Ambulatory Visit (INDEPENDENT_AMBULATORY_CARE_PROVIDER_SITE_OTHER): Payer: Commercial Managed Care - PPO | Admitting: Family Medicine

## 2016-08-22 VITALS — Ht 69.0 in | Wt 220.0 lb

## 2016-08-22 DIAGNOSIS — Z6832 Body mass index (BMI) 32.0-32.9, adult: Secondary | ICD-10-CM

## 2016-08-22 DIAGNOSIS — Z9189 Other specified personal risk factors, not elsewhere classified: Secondary | ICD-10-CM

## 2016-08-22 DIAGNOSIS — E669 Obesity, unspecified: Secondary | ICD-10-CM

## 2016-08-23 ENCOUNTER — Other Ambulatory Visit: Payer: Self-pay | Admitting: Cardiology

## 2016-08-23 ENCOUNTER — Telehealth: Payer: Self-pay | Admitting: Obstetrics and Gynecology

## 2016-08-23 NOTE — Telephone Encounter (Signed)
Left message for patient to call to reschedule Patty Grubb appointment. °

## 2016-08-25 ENCOUNTER — Other Ambulatory Visit: Payer: Self-pay | Admitting: Oncology

## 2016-08-26 ENCOUNTER — Encounter: Payer: Self-pay | Admitting: Oncology

## 2016-08-27 NOTE — Progress Notes (Signed)
  Office: [phone]  /  Fax: [phone]  OBESITY AND PREVENTATIVE COUNSELING BEHAVIORAL INTERVENTION VISIT  Today's visit was # 3 out of 52. (Back on Track #1)  Starting weight: 225 pounds Starting date: 08/09/16 Today's weight : Weight: 220 lb (99.8 kg)  Today's date: 08/22/16 Total lbs lost to date: 5 (Patients must lose 7 lbs in the first 6 months to continue with counseling)  PREVENTATIVE COUNSELING: [name] is at high risk of developing multiple serious health conditions including uncontrolled diabetes, coronary artery disease, heart failure, sleep apnea, chronic pain, depression, obesity related cancers and more due to her weight. These risks have been discussed in depth and Darlene [name] has agreed to work on the underlying disease of obesity to decrease the risk of developing any and all of these obesity related disease  ASK: We discussed the diagnosis of obesity with Darlene [name] today and Darlene [name] agreed to give [name] permission to discuss obesity behavioral modification therapy today.  ASSESS: Darlene [name] has the diagnosis of obesity and her BMI today is 32.6 Darlene [name] is in the action stage of change   ADVISE: Darlene [name] was educated on the multiple health risks of obesity as well as the benefit of weight loss to improve her health. She was advised of the need for long term treatment and the importance of lifestyle modifications.  AGREE: Multiple dietary modification options and treatment options were discussed and  Darlene [name] agreed to keep a food journal with 1300 to 1500 calories and 80 grams of protein  We discussed the following Behavioral Modification Stratagies today: increasing lean protein intake, decreasing simple carbohydrates , increasing vegetables, decrease snacking  and avoiding temptations  We spent > than 50% of the 15 minute visit on the counseling as documented in the note.

## 2016-08-29 ENCOUNTER — Ambulatory Visit (INDEPENDENT_AMBULATORY_CARE_PROVIDER_SITE_OTHER): Payer: Commercial Managed Care - PPO | Admitting: Family Medicine

## 2016-08-29 VITALS — Ht 69.0 in | Wt 220.0 lb

## 2016-08-29 DIAGNOSIS — Z6832 Body mass index (BMI) 32.0-32.9, adult: Secondary | ICD-10-CM

## 2016-08-29 DIAGNOSIS — E669 Obesity, unspecified: Secondary | ICD-10-CM

## 2016-08-29 DIAGNOSIS — Z9189 Other specified personal risk factors, not elsewhere classified: Secondary | ICD-10-CM | POA: Diagnosis not present

## 2016-08-29 DIAGNOSIS — Z980 Intestinal bypass and anastomosis status: Secondary | ICD-10-CM | POA: Diagnosis not present

## 2016-08-29 DIAGNOSIS — Z903 Acquired absence of stomach [part of]: Secondary | ICD-10-CM

## 2016-09-02 ENCOUNTER — Other Ambulatory Visit: Payer: Self-pay | Admitting: Oncology

## 2016-09-02 NOTE — Progress Notes (Signed)
  Office: [phone]  /  Fax: [phone]  OBESITY AND PREVENTATIVE COUNSELING BEHAVIORAL INTERVENTION VISIT  Today's visit was # 4 out of 26, BOT# 2  Starting weight 225 lbs Starting date: 08/09/16 Today's weight : Weight: 220 lb (99.8 kg)  Today's date: 08/29/16 Total lbs lost to date: 5 pounds (Patients must lose 7 lbs in the first 6 months to continue with counseling)  PREVENTATIVE COUNSELING: Darlene [name] is at high risk of developing multiple serious health conditions including uncontrolled diabetes, coronary artery disease, heart failure, sleep apnea, chronic pain, depression, obesity related cancers and more due to her weight. These risks have been discussed in depth and [name] has agreed to work on the underlying disease of obesity to decrease the risk of developing any and all of these obesity related disease.  [name] reported that her breast cancer had not shrunk and will need radiation. She also reported her work had filed for bankruptcy. She admitted increased emotional eating with an average increase this week of approximately 400 calories which she journaled as recommended.  She gained approximately 2 Lbs  of water weight this week.   Emotional eating and strategies to address emotional eating were discussed at length today. [name] agreed to implement these strategies as appropriate to assist her weight loss efforts.   ASK: We discussed the diagnosis of obesity with [name] today and [name] agreed to give [name] permission to discuss obesity behavioral modification therapy today.  ASSESS: [name] has the diagnosis of obesity and her BMI today is 32.6 [name] is in the action stage of change   ADVISE: [name] was educated on the multiple health risks of obesity as well as the benefit of weight loss to improve her health. She was advised of the need for long term treatment and the importance of lifestyle modifications.  AGREE: Multiple dietary modification  options and treatment options were discussed and  English agreed to keep a food journal with 1300 to 1500 calories and 80 + protein  We discussed the following Behavioral Modification Stratagies today: increasing lean protein intake, emotional eating strategies and avoiding temptations.  We spent > than 50% of the 15 minute visit on the counseling as documented in the note.

## 2016-09-03 ENCOUNTER — Ambulatory Visit (HOSPITAL_BASED_OUTPATIENT_CLINIC_OR_DEPARTMENT_OTHER): Payer: Commercial Managed Care - PPO | Admitting: Oncology

## 2016-09-03 ENCOUNTER — Encounter: Payer: Self-pay | Admitting: Radiation Oncology

## 2016-09-03 ENCOUNTER — Other Ambulatory Visit (HOSPITAL_BASED_OUTPATIENT_CLINIC_OR_DEPARTMENT_OTHER): Payer: Commercial Managed Care - PPO

## 2016-09-03 ENCOUNTER — Ambulatory Visit (HOSPITAL_BASED_OUTPATIENT_CLINIC_OR_DEPARTMENT_OTHER): Payer: Commercial Managed Care - PPO

## 2016-09-03 VITALS — BP 126/58 | HR 56 | Temp 97.8°F | Resp 18 | Ht 69.0 in | Wt 221.5 lb

## 2016-09-03 DIAGNOSIS — C773 Secondary and unspecified malignant neoplasm of axilla and upper limb lymph nodes: Secondary | ICD-10-CM

## 2016-09-03 DIAGNOSIS — C50411 Malignant neoplasm of upper-outer quadrant of right female breast: Secondary | ICD-10-CM

## 2016-09-03 DIAGNOSIS — Z171 Estrogen receptor negative status [ER-]: Secondary | ICD-10-CM

## 2016-09-03 DIAGNOSIS — C50911 Malignant neoplasm of unspecified site of right female breast: Secondary | ICD-10-CM

## 2016-09-03 DIAGNOSIS — Z5112 Encounter for antineoplastic immunotherapy: Secondary | ICD-10-CM | POA: Diagnosis not present

## 2016-09-03 DIAGNOSIS — C50919 Malignant neoplasm of unspecified site of unspecified female breast: Secondary | ICD-10-CM

## 2016-09-03 LAB — COMPREHENSIVE METABOLIC PANEL
ALT: 36 U/L (ref 0–55)
AST: 38 U/L — ABNORMAL HIGH (ref 5–34)
Albumin: 3.3 g/dL — ABNORMAL LOW (ref 3.5–5.0)
Alkaline Phosphatase: 95 U/L (ref 40–150)
Anion Gap: 10 mEq/L (ref 3–11)
BUN: 37.8 mg/dL — ABNORMAL HIGH (ref 7.0–26.0)
CO2: 23 mEq/L (ref 22–29)
Calcium: 9.9 mg/dL (ref 8.4–10.4)
Chloride: 107 mEq/L (ref 98–109)
Creatinine: 1.5 mg/dL — ABNORMAL HIGH (ref 0.6–1.1)
EGFR: 36 mL/min/{1.73_m2} — ABNORMAL LOW (ref >90)
Glucose: 227 mg/dl — ABNORMAL HIGH (ref 70–140)
Potassium: 4 mEq/L (ref 3.5–5.1)
Sodium: 140 mEq/L (ref 136–145)
Total Bilirubin: 0.84 mg/dL (ref 0.20–1.20)
Total Protein: 6.8 g/dL (ref 6.4–8.3)

## 2016-09-03 LAB — CBC WITH DIFFERENTIAL/PLATELET
BASO%: 0.5 % (ref 0.0–2.0)
Basophils Absolute: 0 10*3/uL (ref 0.0–0.1)
EOS%: 2.9 % (ref 0.0–7.0)
Eosinophils Absolute: 0.1 10*3/uL (ref 0.0–0.5)
HCT: 34.5 % — ABNORMAL LOW (ref 34.8–46.6)
HGB: 11.5 g/dL — ABNORMAL LOW (ref 11.6–15.9)
LYMPH%: 28.9 % (ref 14.0–49.7)
MCH: 29.5 pg (ref 25.1–34.0)
MCHC: 33.3 g/dL (ref 31.5–36.0)
MCV: 88.5 fL (ref 79.5–101.0)
MONO#: 0.2 10*3/uL (ref 0.1–0.9)
MONO%: 6 % (ref 0.0–14.0)
NEUT#: 2.4 10*3/uL (ref 1.5–6.5)
NEUT%: 61.7 % (ref 38.4–76.8)
Platelets: 72 10*3/uL — ABNORMAL LOW (ref 145–400)
RBC: 3.9 10*6/uL (ref 3.70–5.45)
RDW: 14.8 % — ABNORMAL HIGH (ref 11.2–14.5)
WBC: 3.8 10*3/uL — ABNORMAL LOW (ref 3.9–10.3)
lymph#: 1.1 10*3/uL (ref 0.9–3.3)
nRBC: 0 % (ref 0–0)

## 2016-09-03 MED ORDER — DIPHENHYDRAMINE HCL 25 MG PO CAPS
ORAL_CAPSULE | ORAL | Status: AC
  Filled 2016-09-03: qty 1

## 2016-09-03 MED ORDER — SODIUM CHLORIDE 0.9 % IV SOLN
Freq: Once | INTRAVENOUS | Status: AC
  Administered 2016-09-03: 09:00:00 via INTRAVENOUS

## 2016-09-03 MED ORDER — SODIUM CHLORIDE 0.9 % IV SOLN
8.0000 mg/kg | Freq: Once | INTRAVENOUS | Status: AC
  Administered 2016-09-03: 798 mg via INTRAVENOUS
  Filled 2016-09-03: qty 38

## 2016-09-03 MED ORDER — DIPHENHYDRAMINE HCL 25 MG PO CAPS
12.5000 mg | ORAL_CAPSULE | Freq: Once | ORAL | Status: AC
  Administered 2016-09-03: 25 mg via ORAL

## 2016-09-03 MED ORDER — ACETAMINOPHEN 325 MG PO TABS
650.0000 mg | ORAL_TABLET | Freq: Once | ORAL | Status: AC
  Administered 2016-09-03: 650 mg via ORAL

## 2016-09-03 MED ORDER — ACETAMINOPHEN 325 MG PO TABS
ORAL_TABLET | ORAL | Status: AC
  Filled 2016-09-03: qty 2

## 2016-09-03 NOTE — Patient Instructions (Signed)
[facility] Discharge Instructions for Patients Receiving Chemotherapy  Today you received the following chemotherapy agents Herceptin  To help prevent nausea and vomiting after your treatment, we encourage you to take your nausea medication as directed   Trastuzumab injection for infusion What is this medicine? TRASTUZUMAB (tras TOO zoo mab) is a monoclonal antibody. It is used to treat breast cancer and stomach cancer. This medicine may be used for other purposes; ask your health care provider or pharmacist if you have questions. COMMON BRAND NAME(S): Herceptin What should I tell my health care provider before I take this medicine? They need to know if you have any of these conditions: -heart disease -heart failure -lung or breathing disease, like asthma -an unusual or allergic reaction to trastuzumab, benzyl alcohol, or other medications, foods, dyes, or preservatives -pregnant or trying to get pregnant -breast-feeding How should I use this medicine? This drug is given as an infusion into a vein. It is administered in a hospital or clinic by a specially trained health care professional. Talk to your pediatrician regarding the use of this medicine in children. This medicine is not approved for use in children. Overdosage: If you think you have taken too much of this medicine contact a poison control center or emergency room at once. NOTE: This medicine is only for you. Do not share this medicine with others. What if I miss a dose? It is important not to miss a dose. Call your doctor or health care professional if you are unable to keep an appointment. What may interact with this medicine? This medicine may interact with the following medications: -certain types of chemotherapy, such as daunorubicin, doxorubicin, epirubicin, and idarubicin This list may not describe all possible interactions. Give your health care provider a list of all the medicines, herbs,  non-prescription drugs, or dietary supplements you use. Also tell them if you smoke, drink alcohol, or use illegal drugs. Some items may interact with your medicine. What should I watch for while using this medicine? Visit your doctor for checks on your progress. Report any side effects. Continue your course of treatment even though you feel ill unless your doctor tells you to stop. Call your doctor or health care professional for advice if you get a fever, chills or sore throat, or other symptoms of a cold or flu. Do not treat yourself. Try to avoid being around people who are sick. You may experience fever, chills and shaking during your first infusion. These effects are usually mild and can be treated with other medicines. Report any side effects during the infusion to your health care professional. Fever and chills usually do not happen with later infusions. Do not become pregnant while taking this medicine or for 7 months after stopping it. Women should inform their doctor if they wish to become pregnant or think they might be pregnant. Women of child-bearing potential will need to have a negative pregnancy test before starting this medicine. There is a potential for serious side effects to an unborn child. Talk to your health care professional or pharmacist for more information. Do not breast-feed an infant while taking this medicine or for 7 months after stopping it. Women must use effective birth control with this medicine. What side effects may I notice from receiving this medicine? Side effects that you should report to your doctor or health care professional as soon as possible: -allergic reactions like skin rash, itching or hives, swelling of the face, lips, or tongue -chest pain or  palpitations -cough -dizziness -feeling faint or lightheaded, falls -fever -general ill feeling or flu-like symptoms -signs of worsening heart failure like breathing problems; swelling in your legs and  feet -unusually weak or tired Side effects that usually do not require medical attention (report to your doctor or health care professional if they continue or are bothersome): -bone pain -changes in taste -diarrhea -joint pain -nausea/vomiting -weight loss This list may not describe all possible side effects. Call your doctor for medical advice about side effects. You may report side effects to FDA at 1-800-FDA-1088. Where should I keep my medicine? This drug is given in a hospital or clinic and will not be stored at home. NOTE: This sheet is a summary. It may not cover all possible information. If you have questions about this medicine, talk to your doctor, pharmacist, or health care provider.  2018 Elsevier/Gold Standard (2016-01-02 14:37:52)    If you develop nausea and vomiting that is not controlled by your nausea medication, call the clinic.   BELOW ARE SYMPTOMS THAT SHOULD BE REPORTED IMMEDIATELY:  *FEVER GREATER THAN 100.5 F  *CHILLS WITH OR WITHOUT FEVER  NAUSEA AND VOMITING THAT IS NOT CONTROLLED WITH YOUR NAUSEA MEDICATION  *UNUSUAL SHORTNESS OF BREATH  *UNUSUAL BRUISING OR BLEEDING  TENDERNESS IN MOUTH AND THROAT WITH OR WITHOUT PRESENCE OF ULCERS  *URINARY PROBLEMS  *BOWEL PROBLEMS  UNUSUAL RASH Items with * indicate a potential emergency and should be followed up as soon as possible.  Feel free to call the clinic you have any questions or concerns. The clinic phone number is [phone].  Please show the [facility] at check-in to the Emergency Department and triage nurse.

## 2016-09-03 NOTE — Progress Notes (Signed)
[place]  [phone] [phone]   ID: PHUNG KOTAS DOB: Apr 18, 1950  MR#: [id number]  DEY#:[id number]  Patient Care Team: Vernie Shanks, MD as PCP - General (Family Medicine) Himmelrich, Bryson Ha, RD (Inactive) as Dietitian (Bariatrics) Stark Klein, MD as Consulting Physician (General Surgery) Magrinat, Virgie Dad, MD as Consulting Physician (Oncology) Arloa Koh, MD as Consulting Physician (Radiation Oncology) Mauro Kaufmann, RN as Registered Nurse Rockwell Germany, RN as Registered Nurse Jacelyn Pi, MD as Consulting Physician (Endocrinology) Kem Boroughs, Lenora as Nurse Practitioner (Family Medicine) [name], MD as Consulting Physician (Plastic Surgery) Larey Dresser, MD as Consulting Physician (Cardiology) PCP: Vernie Shanks, MD OTHER MD:  CHIEF COMPLAINT:  HER-2 positive, estrogen receptor negative locally recurrent disease  CURRENT TREATMENT:  trastuzumab  INTERVAL HISTORY:  [name] returns today for follow-up and treatment of her HER-2/neu positive, estrogen receptor negative breast cancer, accompanied by her husband. [name] completed her 8 doses of T-DM 1 and is starting maintenance trastuzumab today.  The PET scan she has 08/19/2016 showed the 2 areas we have been following to be perhaps slightly smaller but certainly not disappearing. There is no other evidence of disease. I am referring her to radiation oncology to deal with this  She will need an echocardiogram sometime within the next month.  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 She is hoping to keep her job but does not know if the double staying [place] or move or whether she will be able to work out of her home. She is also distressed that when the PET results came in she was not immediately called. I was out of town at that time. Nevertheless it was  stressful for her to read it in "my chart" and not be contacted. Aside from these issues she is not exercising regularly. She has a blister in 1 foot which is keeping her from walking. A detailed review of systems today was otherwise stable  BREAST CANCER HISTORY: From the original intake note:  "[name]" had screening mammography at her gynecologist suggestive of a possible abnormality in the right breast. However right diagnostic mammogram 04/12/2013 at [facility] was negative. More recently however, she noted a lump in her right breast and brought it to her gynecologist's attention. On 07/18/2014 the patient had bilateral diagnostic mammography with tomosynthesis at [facility]. The breast density was category B. In the right breast upper outer quadrant there was an area of focal asymmetry with indistinct margins.[name] Ultrasound was obtained and showed in addition to the mass in question, measuring 1.7 cm, an abnormal-appearing lymph node in the right axillary tail.  On 07/19/2014 the patient underwent biopsy of the right breast massin question as well as a right axillary lymph node. Both were positive for invasive ductal carcinoma, grade 2, estrogen and progesterone receptor negative, with an MIB-1 of 90%, and HER-2 pending. Incidentally the lymph node biopsy showed a lymphocytic inflammatory component but no lymph node architecture.  Her subsequent history is as detailed below.  PAST MEDICAL HISTORY: Past Medical History:  Diagnosis Date  . Anemia    hx of  . Anxiety   . Arthritis    in fingers, shoulders  . Back pain   . Balance problem   . Breast cancer ([place])   . Breast cancer of upper-outer quadrant of right female breast ([place]) 07/22/2014  . Bunion, right foot   .  Clumsiness   . Constipation   . Depression   . Diabetes mellitus without complication ([place])    09+ years  . Eating disorder    binge eating  . Epistaxis 08/26/2014  . Fatty liver   . HCAP (healthcare-associated pneumonia) 12/18/2014  .  Headache   . Heart murmur    never had any problems  . Hyperlipidemia   . Hypertension   . Joint pain   . Multiple food allergies   . Neuromuscular disorder ([place])    diabetic neuropathy  . Obesity   . Obstructive sleep apnea on CPAP    uses cpap nightly  . Ovarian cyst rupture    possible  . Pneumonia   . Pneumonia 11/2014  . Radiation 04/05/15-05/19/15   right chest wall 50.4 Gy, mastectomy scar 10 Gy  . Sleep apnea   . Thyroid nodule 06/2014    PAST SURGICAL HISTORY: Past Surgical History:  Procedure Laterality Date  . BIOPSY THYROID Bilateral 06/2014   2 nodules - Benign   . BREAST LUMPECTOMY WITH RADIOACTIVE SEED AND SENTINEL LYMPH NODE BIOPSY Right 12/27/2014   Procedure: BREAST LUMPECTOMY WITH RADIOACTIVE SEED AND SENTINEL LYMPH NODE BIOPSY;  Surgeon: Stark Klein, MD;  Location: [facility];  Service: General;  Laterality: Right;  . BREAST REDUCTION SURGERY Bilateral 01/06/2015   Procedure: BILATERAL BREAST REDUCTION, RIGHT ONCOPLASTIC RECONSTRUCTION),LEFT BREAST REDUCTION FOR SYMMETRY;  Surgeon: [name], MD;  Location: [facility];  Service: Plastics;  Laterality: Bilateral;  . BREATH TEK H PYLORI N/A 09/15/2012   Procedure: BREATH TEK H PYLORI;  Surgeon: Pedro Earls, MD;  Location: [facility];  Service: General;  Laterality: N/A;  . BUNIONECTOMY Left 12/2013  . CARPAL TUNNEL RELEASE Right   . CARPAL TUNNEL RELEASE Left   . DUPUYTREN CONTRACTURE RELEASE    . GASTRIC ROUX-EN-Y N/A 11/02/2012   Procedure: LAPAROSCOPIC ROUX-EN-Y GASTRIC;  Surgeon: Pedro Earls, MD;  Location: [facility];  Service: General;  Laterality: N/A;  . IR GENERIC HISTORICAL  03/18/2016   IR US GUIDE VASC ACCESS LEFT 03/18/2016 Markus Daft, MD [facility]  . IR GENERIC HISTORICAL  03/18/2016   IR FLUORO GUIDE CV LINE LEFT 03/18/2016 Markus Daft, MD [facility]  . LAPAROSCOPIC APPENDECTOMY N/A 07/22/2015   Procedure: APPENDECTOMY LAPAROSCOPIC;  Surgeon: Michael Boston, MD;  Location:  [facility];  Service: General;  Laterality: N/A;  . MASTECTOMY Right 02/15/2015   modified  . MODIFIED MASTECTOMY Right 02/15/2015   Procedure: RIGHT MODIFIED RADICAL MASTECTOMY;  Surgeon: Stark Klein, MD;  Location: [facility];  Service: General;  Laterality: Right;  . PORT-A-CATH REMOVAL Left 10/10/2015   Procedure: PORT REMOVAL;  Surgeon: Stark Klein, MD;  Location: [facility];  Service: General;  Laterality: Left;  . PORTACATH PLACEMENT Left 08/02/2014   Procedure: INSERTION PORT-A-CATH;  Surgeon: Stark Klein, MD;  Location: [facility];  Service: General;  Laterality: Left;  . ROTATOR CUFF REPAIR Right 2005  . SCAR REVISION Right 12/08/2015   Procedure: COMPLEX REPAIR RIGHT CHEST 10-15CM;  Surgeon: [name], MD;  Location: [facility];  Service: Plastics;  Laterality: Right;  . TOE SURGERY  1970s   bone spur  . TRIGGER FINGER RELEASE     x3    FAMILY HISTORY Family History  Problem Relation Age of Onset  . CVA Mother   . Heart disease Mother   . Sudden death Mother   . Diabetes Father   . Heart failure Father   .  Hypertension Father   . Kidney disease Father   . Obesity Father   . Hypertension Sister   . Diabetes Brother   . Breast cancer Paternal Grandmother   . Diabetes Paternal Uncle    the patient's father died from complications of diabetes at age 4. The patient's mother died at age 34 with a subarachnoid hemorrhage. The patient had one brother, one sister. The only breast cancer was the patient's paternal grandmother diagnosed in her 65s. There is no history of ovarian cancer in the family.  GYNECOLOGIC HISTORY:  Patient's last menstrual period was 10/22/2007.  menarche age 42, the patient is GX P0. She went through the change of life in 2011. She did not take hormone replacement.  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 Her husband [name] is a retired Systems developer (he taught at [employer]).  [name] has 2 children from an earlier marriage, Rockey Situ who teaches in [place], and Shardai Star,  who works at the [employer] in in [place]. He has 1 grand son, aged 64. The patient and her husband are not church attender's    ADVANCED DIRECTIVES:  Not in place   HEALTH MAINTENANCE: Social History  Substance Use Topics  . Smoking status: Never Smoker  . Smokeless tobacco: Never Used  . Alcohol use 0.0 oz/week     Comment: social     Colonoscopy:  May 2016  PAP:  Bone density: 07/05/2013 normal, with a T score of 0.4  Lipid panel:  Allergies  Allergen Reactions  . Nsaids Other (See Comments)    H/o gastric bypass - avoid NSAIDs  . Peanuts [Peanut Oil] Itching    "Large amounts"  . Tape Hives and Rash    Adhesives in bandaids    Current Outpatient Prescriptions  Medication Sig Dispense Refill  . acetaminophen (TYLENOL) 325 MG tablet Take 650 mg by mouth 2 (two) times daily as needed for moderate pain or headache. Reported on 04/17/2015    . calcium citrate-vitamin D (CITRACAL+D) 315-200 MG-UNIT tablet Take 1 tablet by mouth 3 (three) times daily.    . cholecalciferol (VITAMIN D) 1000 units tablet Take 1,000 Units by mouth daily.    . diphenhydrAMINE (BENADRYL) 25 MG tablet Take 25 mg by mouth daily as needed for allergies or sleep. Reported on 03/06/2015    . glucose blood (ONETOUCH VERIO) test strip 1 each by Other route as needed for other. Reported on 03/06/2015    . hydrochlorothiazide (HYDRODIURIL) 25 MG tablet Take 1 tablet (25 mg total) by mouth daily. 90 tablet 3  . hydrochlorothiazide (HYDRODIURIL) 25 MG tablet TAKE 1 TABLET BY MOUTH EVERY DAY 30 tablet 11  . Insulin Glargine (BASAGLAR KWIKPEN) 100 UNIT/ML SOPN Inject 0.25 mLs (25 Units total) into the skin at bedtime. 5 pen 0  . losartan (COZAAR) 100 MG tablet Take 1 tablet by mouth daily. Reported on 03/06/2015    . Melatonin 5 MG TABS Take 5 mg by mouth at bedtime.  Reported on 03/06/2015    . mometasone (NASONEX) 50 MCG/ACT nasal spray Place 1-2 sprays into the nose daily as needed (for allergies.).     [name] Pediatric Multivitamins-Iron (FLINTSTONES PLUS IRON) chewable tablet Chew 1 tablet by mouth 2 (two) times daily.    . Probiotic Product ([employer] PROBIOTIC PO) Take 1 capsule by mouth daily. Reported on 03/06/2015    . Propylene Glycol (SYSTANE BALANCE OP) Apply 1-2  drops to eye 3 (three) times daily.    [name] senna-docusate (SENNA PLUS) 8.6-50 MG tablet Take 2 tablets by mouth at bedtime.    . terbinafine (LAMISIL) 250 MG tablet Take 250 mg by mouth every 30 (thirty) days.    . Vortioxetine HBr (TRINTELLIX) 20 MG TABS Take 20 mg by mouth at bedtime.      No current facility-administered medications for this visit.     OBJECTIVE: Middle-aged white woman Who appears stated age  9:   09/03/16 0824  BP: (!) 126/58  Pulse: (!) 56  Resp: 18  Temp: 97.8 F (36.6 C)  SpO2: 97%     Body mass index is 32.71 kg/m.    ECOG FS:1 - Symptomatic but completely ambulatory Filed Weights   09/03/16 0824  Weight: 221 lb 8 oz (100.5 kg)   Sclerae unicteric, EOMs intact Oropharynx clear and moist No cervical or supraclavicular adenopathy Lungs no rales or rhonchi Heart regular rate and rhythm Abd soft, nontender, positive bowel sounds MSK no focal spinal tenderness, no upper extremity lymphedema Neuro: nonfocal, well oriented, appropriate affect Breasts: Deferred   LAB RESULTS:  CMP     Component Value Date/Time   NA 141 08/13/2016 0840   K 3.9 08/13/2016 0840   CL 104 08/09/2016 1229   CO2 22 08/13/2016 0840   GLUCOSE 193 (H) 08/13/2016 0840   BUN 40.3 (H) 08/13/2016 0840   CREATININE 1.5 (H) 08/13/2016 0840   CALCIUM 9.7 08/13/2016 0840   PROT 7.0 08/13/2016 0840   ALBUMIN 3.5 08/13/2016 0840   AST 40 (H) 08/13/2016 0840   ALT 33 08/13/2016 0840   ALKPHOS 101 08/13/2016 0840   BILITOT 0.82 08/13/2016 0840   GFRNONAA 43 (L) 08/09/2016 1229    GFRNONAA 55 (L) 02/18/2013 0827   GFRAA 50 (L) 08/09/2016 1229   GFRAA 63 02/18/2013 0827    INo results found for: SPEP, UPEP  Lab Results  Component Value Date   WBC 5.2 08/13/2016   NEUTROABS 3.3 08/13/2016   HGB 12.0 08/13/2016   HCT 35.0 08/13/2016   MCV 87.9 08/13/2016   PLT 122 (L) 08/13/2016      Chemistry      Component Value Date/Time   NA 141 08/13/2016 0840   K 3.9 08/13/2016 0840   CL 104 08/09/2016 1229   CO2 22 08/13/2016 0840   BUN 40.3 (H) 08/13/2016 0840   CREATININE 1.5 (H) 08/13/2016 0840      Component Value Date/Time   CALCIUM 9.7 08/13/2016 0840   ALKPHOS 101 08/13/2016 0840   AST 40 (H) 08/13/2016 0840   ALT 33 08/13/2016 0840   BILITOT 0.82 08/13/2016 0840       No results found for: LABCA2  No components found for: [id number]  No results for input(s): INR in the last 168 hours.  Urinalysis    Component Value Date/Time   COLORURINE YELLOW 07/21/2015 [place] 07/21/2015 2328   LABSPEC 1.020 07/21/2015 2328   PHURINE 6.0 07/21/2015 2328   GLUCOSEU NEGATIVE 07/21/2015 2328   HGBUR TRACE (A) 07/21/2015 [place] 07/21/2015 2328   BILIRUBINUR neg 06/29/2013 [place] 07/21/2015 2328   PROTEINUR 30 (A) 07/21/2015 2328   UROBILINOGEN negative 06/29/2013 1017   NITRITE NEGATIVE 07/21/2015 2328   LEUKOCYTESUR NEGATIVE 07/21/2015 2328    STUDIES: Nm Pet Image Restag (ps) Skull Base To Thigh  Result Date: 08/19/2016 CLINICAL DATA:  Subsequent treatment strategy for metastatic breast  cancer. EXAM: NUCLEAR MEDICINE PET SKULL BASE TO THIGH TECHNIQUE: 10.8 mCi F-18 FDG was injected intravenously. Full-ring PET imaging was performed from the skull base to thigh after the radiotracer. CT data was obtained and used for attenuation correction and anatomic localization. FASTING BLOOD GLUCOSE:  Value: 186 mg/dl COMPARISON:  May 07, 2016 FINDINGS: NECK: No hypermetabolic lymph nodes in the neck.  CHEST: The right subpectoral node on series 4, image 49 measures 9 mm on both studies with a maximum SUV of 3.8 today versus 2.8 previously. Increased soft tissue adjacent to right axillary nodes measures 1.3 cm, unchanged, on series 4, image 58 with a maximum SUV of 2.2 today versus 2.9 previously. ABDOMEN/PELVIS: No abnormal hypermetabolic activity within the liver, pancreas, adrenal glands, or spleen. No hypermetabolic lymph nodes in the abdomen or pelvis. SKELETON: No focal hypermetabolic activity to suggest skeletal metastasis. IMPRESSION: 1. The right subpectoral node on series 4, image 49 is unchanged in size in the interval measuring 9 mm on both studies. The maximum SUV is 3.8 today versus 2.8 previously with mild increased prominence in uptake in the interval. Recommend attention on follow-up. In contrast, the soft tissue adjacent to right axillary clips is less FDG avid in the interval with no discrete disease remaining in this region. Electronically Signed   By: Dorise Bullion III M.D   On: 08/19/2016 14:08    ASSESSMENT: 66 y.o. [place] woman status post right breast upper outer quadrant and right axillary lymph node biopsy 07/19/2014 for a cT2  pN1, stage IIB  invasive ductal carcinoma, grade 2, estrogen and progesterone receptor negative, with an MIB-1 of 90%, and HER-2 amplified  (1) neoadjuvant chemotherapy started 08/08/2014, consisting of carboplatin, docetaxel, trastuzumab and pertuzumab given every 3 weeks 6, completed 11/21/2014  (a) breast MRI after initial 3 cycles shows a significant response  (b) breast MRI after 6 cycles shows a complete radiologic response   (2) trastuzumab continued through 10/02/2015 (to end of capecitabine therapy)  (a) echo 02/13/2015 shows an EF of 60-65%  (b)  Echo 05/09/2015 shows an ejection fraction of 60-65%  (c) echocardiogram 06/27/2015 shows an ejection fraction of 55-60%.  (3) right lumpectomy with sentinel lymph node biopsy on 12/27/14  showed a residual  ypT2 ypN1a, stage IIB invasive ductal carcinoma, grade 2, with repeat estrogen and progesterone receptors again negative  (4) status post right oncoplastic surgery (with left reduction mammoplasty) 01/06/2015 showing in the right breast multiple foci of intralymphatic carcinoma in random sections  (5)  Right modified radical mastectomy 02/15/2014 showed no residual invasive disease; there was DCIS but margins were negative; all 15 axillary lymph nodes were clear; ypTis ypN0  (a)  The patient has decided against reconstruction.  (6)  Postmastectomy radiation completed 05/18/2015 with capecitabine sensitization  (7) adjuvant capecitabine continued through 09/27/2015  (8) hepatic steatosis with increased fibrosis score (at risk for cirrhosis)  (9) thyroid biopsy (left lobe inferiorly) 2 shows benign tissue 06/28/2015  (10) LOCAL RECURRENCE:  (a) PET scan 12/06/2015 Notes a right retropectoral lymph node measuring 0.9 cm with an SUV max of 5.5  (b) repeat PET scan 03/11/2016 finds the right subpectoral lymph node to now measure 1.3 cm with an SUV max of 11.8.  (c) PET scan 05/07/2016 shows a significant response to T-DM 1  (11) started T-DM1 03/21/2016, completed 8 doses 08/13/2016  (a) PET scan 08/19/2016 shows a good response but measurable residual disease  (12) maintenance trastuzumab started 09/03/2016  (a) repeat echocardiogram 07/08/2016 shows an  ejection fraction of 60-65%.  (13) referral to radiation oncology placed 09/03/2016  PLAN: [name] has no distant disease metastases and one of the 2 lesions we have been following almost completely cleared with the T-DM 1. I am switching her to trastuzumab at this point, which we will continue for at least 2 years, and referring her to radiation oncology to see if they agree that radiation of the 2 small areas of residual disease is the best ways to proceed at this point.  She will need a repeat echocardiogram sometime in  the next month and I am copying cardio oncology without any mind.  [name]'s situation of course is stressful. I am hopeful with the radiation and continuing trastuzumab when we repeat a PET scan in December there will be no disease activity. If so I will switch her to every 4 week trastuzumab and week we will continue that fairly indefinitely.  Today we discussed whether she would want a port. She has done some reading about it and is worried about getting one of but on the other hand she had a very good result with support previously. At this point she wants to just continue to do what we are doing.  In the meantime I have strongly encouraged her to start an exercise program.  I'm going to see her again in 3 weeks. She knows to call for any other issues that may develop before that visit. Chauncey Cruel, MD   09/03/2016 8:33 AM

## 2016-09-04 ENCOUNTER — Encounter (INDEPENDENT_AMBULATORY_CARE_PROVIDER_SITE_OTHER): Payer: Self-pay | Admitting: Family Medicine

## 2016-09-04 NOTE — Progress Notes (Signed)
Histology and Location of Primary Cancer: Malignant neoplasm of upper-outer quadrant of right breast   Location(s) of Symptomatic tumor(s): right subpectoral node on series 4 measuring 9 mm   Past/Anticipated chemotherapy by medical oncology, if any: maintenance trastuzumab resumed 09/03/2016   SAFETY ISSUES:  Prior radiation? 04/05/15-05/19/15 right chest wall 50.4 Gy, mastectomy scar 10 Gy  Pacemaker/ICD? no  Possible current pregnancy? no  Is the patient on methotrexate? no  Additional Complaints / other details:    BP 138/65 (BP Location: Left Arm, Patient Position: Sitting)   Pulse (!) 52   Temp 98.3 F (36.8 C) (Oral)   Ht 5\' 9"  (1.753 m)   Wt 221 lb (100.2 kg)   LMP 10/22/2007   SpO2 100%   BMI 32.64 kg/m    Wt Readings from Last 3 Encounters:  09/12/16 221 lb (100.2 kg)  09/05/16 219 lb (99.3 kg)  09/03/16 221 lb 8 oz (100.5 kg)

## 2016-09-05 ENCOUNTER — Ambulatory Visit (INDEPENDENT_AMBULATORY_CARE_PROVIDER_SITE_OTHER): Payer: Commercial Managed Care - PPO | Admitting: Family Medicine

## 2016-09-05 VITALS — Ht 69.0 in | Wt 219.0 lb

## 2016-09-05 DIAGNOSIS — Z6832 Body mass index (BMI) 32.0-32.9, adult: Secondary | ICD-10-CM

## 2016-09-05 DIAGNOSIS — Z980 Intestinal bypass and anastomosis status: Secondary | ICD-10-CM | POA: Diagnosis not present

## 2016-09-05 DIAGNOSIS — Z903 Acquired absence of stomach [part of]: Secondary | ICD-10-CM

## 2016-09-05 DIAGNOSIS — Z9189 Other specified personal risk factors, not elsewhere classified: Secondary | ICD-10-CM

## 2016-09-05 DIAGNOSIS — E669 Obesity, unspecified: Secondary | ICD-10-CM | POA: Diagnosis not present

## 2016-09-06 ENCOUNTER — Ambulatory Visit (INDEPENDENT_AMBULATORY_CARE_PROVIDER_SITE_OTHER): Payer: Commercial Managed Care - PPO | Admitting: Podiatry

## 2016-09-06 ENCOUNTER — Other Ambulatory Visit: Payer: Self-pay

## 2016-09-06 ENCOUNTER — Ambulatory Visit (INDEPENDENT_AMBULATORY_CARE_PROVIDER_SITE_OTHER): Payer: Commercial Managed Care - PPO

## 2016-09-06 ENCOUNTER — Encounter (INDEPENDENT_AMBULATORY_CARE_PROVIDER_SITE_OTHER): Payer: Self-pay | Admitting: Family Medicine

## 2016-09-06 ENCOUNTER — Encounter: Payer: Self-pay | Admitting: Podiatry

## 2016-09-06 DIAGNOSIS — Q828 Other specified congenital malformations of skin: Secondary | ICD-10-CM

## 2016-09-06 DIAGNOSIS — M79675 Pain in left toe(s): Secondary | ICD-10-CM

## 2016-09-06 DIAGNOSIS — M79674 Pain in right toe(s): Secondary | ICD-10-CM

## 2016-09-06 DIAGNOSIS — E1143 Type 2 diabetes mellitus with diabetic autonomic (poly)neuropathy: Secondary | ICD-10-CM | POA: Diagnosis not present

## 2016-09-06 DIAGNOSIS — L84 Corns and callosities: Secondary | ICD-10-CM

## 2016-09-06 DIAGNOSIS — M21619 Bunion of unspecified foot: Secondary | ICD-10-CM

## 2016-09-06 NOTE — Progress Notes (Signed)
   Subjective:    Patient ID: Darlene [name], female    DOB: Jul 06, 1950, 66 y.o.   MRN: [id number]  HPI  66 year old female presents the also concerns of the bunion to the right foot which then ongoing for several years. She states the areas painful pressure in shoes and she has tried changing shoes without any significant improvement she also has custom molded orthotics. She is interested in surgical intervention to help correct her bunion. She had a left side done previously which did well.  She states that she has cancer currently just to see radiation oncologist next week to develop plan. She denies any claudication symptoms but she does have ongoing numbness and tingling to her feet. She has no other concerns today.  Review of Systems  HENT: Positive for hearing loss and sinus pressure.   Musculoskeletal:       Joint pain   Allergic/Immunologic: Positive for food allergies.  Neurological: Positive for numbness.  All other systems reviewed and are negative.      Objective:   Physical Exam  General: AAO x3, NAD  Dermatological: Mild erythema along the medial aspect of the first metatarsal head on the bunion site of the right side. No skin breakdowns present. No clinical signs of infection. There are no open sores, no preulcerative lesions, no rash or signs of infection present. Hyperkeratotic lesions right foot submetatarsal one and medial hallux also presents left hip is not significant.  Vascular: Dorsalis Pedis artery and Posterior Tibial artery pedal pulses are 2/4 bilateral with immedate capillary fill time. There is no pain with calf compression, swelling, warmth, erythema.   Neruologic: Grossly intact via light touch bilateral. Protective threshold with Semmes Wienstein monofilament decreased to all pedal sites bilateral.  Musculoskeletal: Moderate HAV is present in the right foot. There is no pain with first MPJ range of motion. There is no significant first ray  hypermobility present. There is tenderness on bunion site. There is no other areas of tenderness involving this time.   Assessment: 66 year old female with right symptomatic HAV   Plan: -Treatment options discussed including all alternatives, risks, and complications -Etiology of symptoms were discussed -X-rays were obtained and reviewed with the patient. Moderate to severe HAV is present in the right foot. There is no evidence of acute fracture identified today. Vessel custom patient's present. -Discussed both conservative and surgical treatment options. At this point as she has not yet started her new radiation treatment of her to get plan as this before proceeding with elective foot surgery. Also given the vessel calcifications x-ray and will get arterial studies. Will order these today. Her A1c did elevate to 8.1 that she states that she's been working on this over the last 6 weeks with her diet. Prior to any surgery will need to recheck A1c.  -Debrided hyperkeratotic tissue to the any complications or bleeding. Debrided for lesions today. Offloading pads were dispensed. Discussed shoe gear modifications.   Celesta Gentile, DPM

## 2016-09-06 NOTE — Patient Instructions (Signed)
Bunion A bunion is a bump on the base of the big toe that forms when the bones of the big toe joint move out of position. Bunions may be small at first, but they often get larger over time. The can make walking painful. What are the causes? A bunion may be caused by:  Wearing narrow or pointed shoes that force the big toe to press against the other toes.  Abnormal foot development that causes the foot to roll inward (pronate).  Changes in the foot that are caused by certain diseases, such as rheumatoid arthritis and polio.  A foot injury.  What increases the risk? The following factors may make you more likely to develop this condition:  Wearing shoes that squeeze the toes together.  Having certain diseases, such as: ? Rheumatoid arthritis. ? Polio. ? Cerebral palsy.  Having family members who have bunions.  Being born with a foot deformity, such as flat feet or low arches.  Doing activities that put a lot of pressure on the feet, such as ballet dancing.  What are the signs or symptoms? The main symptom of a bunion is a noticeable bump on the big toe. Other symptoms may include:  Pain.  Swelling around the big toe.  Redness and inflammation.  Thick or hardened skin on the big toe or between the toes.  Stiffness or loss of motion in the big toe.  Trouble with walking.  How is this diagnosed? A bunion may be diagnosed based on your symptoms, medical history, and activities. You may have tests, such as:  X-rays. These allow your health care provider to check the position of the bones in your foot and look for damage to your joint. They also help your health care provider to determine the severity of your bunion and the best way to treat it.  Joint aspiration. In this test, a sample of fluid is removed from the toe joint. This test, which may be done if you are in a lot of pain, helps to rule out diseases that cause painful swelling of the joints, such as  arthritis.  How is this treated? There is no cure for a bunion, but treatment can help to prevent a bunion from getting worse. Treatment depends on the severity of your symptoms. Your health care provider may recommend:  Wearing shoes that have a wide toe box.  Using bunion pads to cushion the affected area.  Taping your toes together to keep them in a normal position.  Placing a device inside your shoe (orthotics) to help reduce pressure on your toe joint.  Taking medicine to ease pain, inflammation, and swelling.  Applying heat or ice to the affected area.  Doing stretching exercises.  Surgery to remove scar tissue and move the toes back into their normal position. This treatment is rare.  Follow these instructions at home:  Support your toe joint with proper footwear, shoe padding, or taping as told by your health care provider.  Take over-the-counter and prescription medicines only as told by your health care provider.  If directed, apply ice to the injured area: ? Put ice in a plastic bag. ? Place a towel between your skin and the bag. ? Leave the ice on for 20 minutes, 2-3 times per day.  If directed, apply heat to the affected area before you exercise. Use the heat source that your health care provider recommends, such as a moist heat pack or a heating pad. ? Place a towel between your   skin and the heat source. ? Leave the heat on for 20-30 minutes. ? Remove the heat if your skin turns bright red. This is especially important if you are unable to feel pain, heat, or cold. You may have a greater risk of getting burned.  Do exercises as told by your health care provider.  Keep all follow-up visits as told by your health care provider. Contact a health care provider if:  Your symptoms get worse.  Your symptoms do not improve in 2 weeks. Get help right away if:  You have severe pain and trouble with walking. This information is not intended to replace advice given  to you by your health care provider. Make sure you discuss any questions you have with your health care provider. Document Released: 01/07/2005 Document Revised: 06/15/2015 Document Reviewed: 08/07/2014 Elsevier Interactive Patient Education  2018 [employer]  

## 2016-09-06 NOTE — Progress Notes (Signed)
Faxed orders to [facility] [phone]

## 2016-09-10 NOTE — Progress Notes (Signed)
  Office: [phone]  /  Fax: [phone]  OBESITY AND PREVENTATIVE COUNSELING BEHAVIORAL INTERVENTION VISIT  Today's visit was # 5 out of 13, BOT# 3  Starting weight: 219 lbs  Starting date: 08/09/16 Today's weight : Weight: 219 lb (99.3 kg)  Today's date:09/05/16 Total lbs lost to date: 6 lbs  (Patients must lose 7 lbs in the first 6 months to continue with counseling)  PREVENTATIVE COUNSELING: Darlene [name] is at high risk of developing multiple serious health conditions including uncontrolled diabetes, coronary artery disease, heart failure, sleep apnea, chronic pain, depression, obesity related cancers and more due to her weight. These risks have been discussed in depth and Darlene [name] has agreed to work on the underlying disease of obesity to decrease the risk of developing any and all of these obesity related disease.  Darlene [name] has increased simple carbohydrates this week especially on the weekend but has done very well journaling. Darlene [name] reported increasing her insulin to 28 units. FBS's in the 140's. Strategies for eating healthy in an unsupportive environment was discussed at length today. Focus on behavioral interventions for creating a supportive environment and minimizing temptations.    ASK: We discussed the diagnosis of obesity with Darlene [name] today and Darlene [name] agreed to give [name] permission to discuss obesity behavioral modification therapy today.  ASSESS: Darlene [name] has the diagnosis of obesity and her BMI today is 32.4 Darlene [name] is in the action stage of change   ADVISE: Darlene [name] was educated on the multiple health risks of obesity as well as the benefit of weight loss to improve her health. She was advised of the need for long term treatment and the importance of lifestyle modifications.  AGREE: Multiple dietary modification options and treatment options were discussed and  Darlene [name] agreed to keep a food journal with 1300 to 1500  calories and 80 + protein  We  discussed the following Behavioral Modification Stratagies today: dealing with family or coworker sabotage and avoiding temptations  We spent > than 50% of the 15 minute visit on the counseling as documented in the note.

## 2016-09-12 ENCOUNTER — Ambulatory Visit
Admission: RE | Admit: 2016-09-12 | Discharge: 2016-09-12 | Disposition: A | Discharge status: Home / Self Care | Payer: Commercial Managed Care - PPO | Source: Ambulatory Visit | Attending: Radiation Oncology | Admitting: Radiation Oncology

## 2016-09-12 ENCOUNTER — Encounter: Payer: Self-pay | Admitting: Radiation Oncology

## 2016-09-12 ENCOUNTER — Ambulatory Visit (INDEPENDENT_AMBULATORY_CARE_PROVIDER_SITE_OTHER): Payer: Commercial Managed Care - PPO | Admitting: Family Medicine

## 2016-09-12 VITALS — Ht 69.0 in | Wt 217.0 lb

## 2016-09-12 VITALS — BP 138/65 | HR 52 | Temp 98.3°F | Ht 69.0 in | Wt 221.0 lb

## 2016-09-12 DIAGNOSIS — Z886 Allergy status to analgesic agent status: Secondary | ICD-10-CM | POA: Insufficient documentation

## 2016-09-12 DIAGNOSIS — Z91010 Allergy to peanuts: Secondary | ICD-10-CM | POA: Diagnosis not present

## 2016-09-12 DIAGNOSIS — Z510 Encounter for antineoplastic radiation therapy: Secondary | ICD-10-CM | POA: Diagnosis present

## 2016-09-12 DIAGNOSIS — Z794 Long term (current) use of insulin: Secondary | ICD-10-CM | POA: Insufficient documentation

## 2016-09-12 DIAGNOSIS — Z9011 Acquired absence of right breast and nipple: Secondary | ICD-10-CM | POA: Diagnosis not present

## 2016-09-12 DIAGNOSIS — Z6832 Body mass index (BMI) 32.0-32.9, adult: Secondary | ICD-10-CM

## 2016-09-12 DIAGNOSIS — E669 Obesity, unspecified: Secondary | ICD-10-CM

## 2016-09-12 DIAGNOSIS — C50911 Malignant neoplasm of unspecified site of right female breast: Secondary | ICD-10-CM

## 2016-09-12 DIAGNOSIS — Z79899 Other long term (current) drug therapy: Secondary | ICD-10-CM | POA: Insufficient documentation

## 2016-09-12 DIAGNOSIS — Z923 Personal history of irradiation: Secondary | ICD-10-CM | POA: Insufficient documentation

## 2016-09-12 DIAGNOSIS — E119 Type 2 diabetes mellitus without complications: Secondary | ICD-10-CM

## 2016-09-12 DIAGNOSIS — Z9189 Other specified personal risk factors, not elsewhere classified: Secondary | ICD-10-CM | POA: Diagnosis not present

## 2016-09-12 DIAGNOSIS — C50411 Malignant neoplasm of upper-outer quadrant of right female breast: Secondary | ICD-10-CM | POA: Diagnosis not present

## 2016-09-12 DIAGNOSIS — Z171 Estrogen receptor negative status [ER-]: Secondary | ICD-10-CM | POA: Diagnosis not present

## 2016-09-12 DIAGNOSIS — Z9889 Other specified postprocedural states: Secondary | ICD-10-CM | POA: Diagnosis not present

## 2016-09-12 MED ORDER — METFORMIN HCL 500 MG PO TABS: 500.0000 mg | ORAL_TABLET | Freq: Every day | ORAL | 0 refills | Status: DC

## 2016-09-12 NOTE — Progress Notes (Signed)
Radiation Oncology         [phone] ________________________________  Name: Darlene [name] MRN: [id number]  Date: 09/12/2016  DOB: 1950-09-22    Re-Consult Visit Note  CC: Vernie Shanks, MD  Magrinat, Virgie Dad, MD    ICD-10-CM   1. Malignant neoplasm of upper-outer quadrant of right breast in female, estrogen receptor negative ([place]) C50.411    Z17.1   2. Recurrent breast cancer, right ([place]) C50.911     Diagnosis: ypT2 ypN0, stage IIB invasive ductal carcinoma, grade 2 of the right breast, now recurrent  Interval Since Last Radiation: 1 year 4 months  04/05/2015-05/19/2015:  1. The right chest wall was treated to 50.4 Gy in 28 fractions at 1.8 Gy per fraction. 2. The right mastectomy scar was treated to 10 Gy in 5 fractions at 2 Gy per fraction.  Narrative:  The patient returns today for re-consult. She was last seen by Dr. Jana Hakim on 09/03/2016. She resumed maintenance trastuzumab on 09/03/16. She has been tolerating treatment well. She reports fatigue sometimes but attributes this to age. She denies tingling in her arms. She has a lymphedema sleeve and wore it on a long flight but otherwise doesn't wear it. She does report some numbness on the back upper right arm after surgery. She did not have any issues after radiation treatment. She denies any itching or burning in the treatment area.   Recent PET scan on 08/19/2016 showed the right subpectoral node on series 4, image 49 is unchanged in size in the interval measuring 9 mm on both studies. The maximum SUV is 3.8 versus 2.8 previously with mild increased prominence in uptake in the interval. In contrast, the soft tissue adjacent to right axillary clips is less FDG avid in the interval with no discrete disease remaining in this region. No other areas of suspicious activity noted on PET scan  ALLERGIES:  is allergic to nsaids; peanuts [peanut oil]; and tape.  Meds: Current Outpatient Prescriptions  Medication Sig Dispense  Refill  . acetaminophen (TYLENOL) 325 MG tablet Take 650 mg by mouth 2 (two) times daily as needed for moderate pain or headache. Reported on 04/17/2015    . calcium citrate-vitamin D (CITRACAL+D) 315-200 MG-UNIT tablet Take 1 tablet by mouth 3 (three) times daily.    . cholecalciferol (VITAMIN D) 1000 units tablet Take 1,000 Units by mouth daily.    . diphenhydrAMINE (BENADRYL) 25 MG tablet Take 25 mg by mouth daily as needed for allergies or sleep. Reported on 03/06/2015    . glucose blood (ONETOUCH VERIO) test strip 1 each by Other route as needed for other. Reported on 03/06/2015    . hydrochlorothiazide (HYDRODIURIL) 25 MG tablet TAKE 1 TABLET BY MOUTH EVERY DAY 30 tablet 11  . Insulin Glargine (BASAGLAR KWIKPEN) 100 UNIT/ML SOPN Inject 0.25 mLs (25 Units total) into the skin at bedtime. (Patient taking differently: Inject 28 Units into the skin at bedtime. ) 5 pen 0  . losartan (COZAAR) 100 MG tablet Take 1 tablet by mouth daily. Reported on 03/06/2015    . Melatonin 5 MG TABS Take 5 mg by mouth at bedtime. Reported on 03/06/2015    . mometasone (NASONEX) 50 MCG/ACT nasal spray Place 1-2 sprays into the nose daily as needed (for allergies.).     [name] Pediatric Multivitamins-Iron (FLINTSTONES PLUS IRON) chewable tablet Chew 1 tablet by mouth 2 (two) times daily.    . Probiotic Product ([employer] PROBIOTIC PO) Take 1 capsule by mouth daily. Reported on  03/06/2015    . Propylene Glycol (SYSTANE BALANCE OP) Apply 1-2 drops to eye 3 (three) times daily.    [name] senna-docusate (SENNA PLUS) 8.6-50 MG tablet Take 2 tablets by mouth at bedtime.    . Vortioxetine HBr (TRINTELLIX) 20 MG TABS Take 20 mg by mouth at bedtime.      No current facility-administered medications for this encounter.     Physical Findings: The patient is in no acute distress. Patient is alert and oriented.  height is 5\' 9"  (1.753 m) and weight is 221 lb (100.2 kg). Her oral temperature is 98.3 F (36.8 C). Her blood pressure is 138/65 and  her pulse is 52 (abnormal). Her oxygen saturation is 100%.   Lungs are clear to auscultation bilaterally. Heart has regular rate and rhythm. No palpable cervical, supraclavicular, or axillary adenopathy. Mastectomy scar on the right chest wall without palpable or visible signs of recurrence. Right side shows mild hyperpigmentation changes and tattoos in place from previous radiation treatment. Left breast reduction mammoplasty scars in place. No palpable or visible signs of cancer on the left side.    Lab Findings: Lab Results  Component Value Date   WBC 3.8 (L) 09/03/2016   HGB 11.5 (L) 09/03/2016   HCT 34.5 (L) 09/03/2016   MCV 88.5 09/03/2016   PLT 72 (L) 09/03/2016    Radiographic Findings: Nm Pet Image Restag (ps) Skull Base To Thigh  Result Date: 08/19/2016 CLINICAL DATA:  Subsequent treatment strategy for metastatic breast cancer. EXAM: NUCLEAR MEDICINE PET SKULL BASE TO THIGH TECHNIQUE: 10.8 mCi F-18 FDG was injected intravenously. Full-ring PET imaging was performed from the skull base to thigh after the radiotracer. CT data was obtained and used for attenuation correction and anatomic localization. FASTING BLOOD GLUCOSE:  Value: 186 mg/dl COMPARISON:  May 07, 2016 FINDINGS: NECK: No hypermetabolic lymph nodes in the neck. CHEST: The right subpectoral node on series 4, image 49 measures 9 mm on both studies with a maximum SUV of 3.8 today versus 2.8 previously. Increased soft tissue adjacent to right axillary nodes measures 1.3 cm, unchanged, on series 4, image 58 with a maximum SUV of 2.2 today versus 2.9 previously. ABDOMEN/PELVIS: No abnormal hypermetabolic activity within the liver, pancreas, adrenal glands, or spleen. No hypermetabolic lymph nodes in the abdomen or pelvis. SKELETON: No focal hypermetabolic activity to suggest skeletal metastasis. IMPRESSION: 1. The right subpectoral node on series 4, image 49 is unchanged in size in the interval measuring 9 mm on both studies. The  maximum SUV is 3.8 today versus 2.8 previously with mild increased prominence in uptake in the interval. Recommend attention on follow-up. In contrast, the soft tissue adjacent to right axillary clips is less FDG avid in the interval with no discrete disease remaining in this region. Electronically Signed   By: Dorise Bullion III M.D   On: 08/19/2016 14:08   Dg Foot Complete Right  Result Date: 09/11/2016 Please see detailed radiograph report in office note.   Impression: ypT2 ypN0, stage IIB invasive ductal carcinoma, grade 2 of the right breast s/p right mastectomy and radiation therapy.   Recent PET CT scan shows a right subpectoral lymph node with mild increased prominence in uptake, likely residual/recurrent disease. I reviewed this area of concern and this is just superior to her previous radiation fields. She therefore would be a candidate for radiation therapy to address this area.  Today, I talked to the patient about the findings and work-up thus far. We discussed the natural history  of breast cancer and general treatment, highlighting the role of radiotherapy in the management. We discussed the available radiation techniques, and focused on the details of logistics and delivery. We reviewed the anticipated acute and late sequelae associated with radiation in this setting. The patient was encouraged to ask questions that I answered to the best of my ability. The patient would like to proceed with radiation and will be scheduled for CT simulation.  I am unsure how many treatments she will receive, but will run a composite plan with previous radiation treatment to see how much overlap over the brachial plexus - this will be the limiting factor for how high we can safely go with her radiation treatment. Patient understands with the potential for overlap there is a  risk for complications including lymphedema and brachioplexopathy. Given that this is the only area of active disease she is willing  to accept this risk.  Plan: The patient will proceed with CT simulation and planning appointment next week.   -----------------------------------  Blair Promise, PhD, MD  This document serves as a record of services personally performed by Gery Pray, MD. It was created on his behalf by Arlyce Harman, a trained medical scribe. The creation of this record is based on the scribe's personal observations and the provider's statements to them. This document has been checked and approved by the attending provider.

## 2016-09-13 ENCOUNTER — Other Ambulatory Visit: Payer: Self-pay | Admitting: Podiatry

## 2016-09-13 DIAGNOSIS — I739 Peripheral vascular disease, unspecified: Secondary | ICD-10-CM

## 2016-09-16 NOTE — Progress Notes (Signed)
Office: [phone]  /  Fax: [phone]   HPI:   Chief Complaint: DMII  Diabetes II Takina has a diagnosis of diabetes type II. Meganne states BGs have been labile ranging between 64 and 159 and denies any hypoglycemic episodes. Last A1c was Hemoglobin A1C Latest Ref Rng & Units 08/09/2016  HGBA1C 4.8 - 5.6 % 8.1(H)  Some recent data might be hidden    She has been working on intensive lifestyle modifications including diet, exercise, and weight loss to help control her blood glucose levels. She is on lantus at 28 units qhs and not quite controlled. She has not been on metformin in the past.  ALLERGIES: Allergies  Allergen Reactions  . Nsaids Other (See Comments)    H/o gastric bypass - avoid NSAIDs  . Peanuts [Peanut Oil] Itching    "Large amounts"  . Tape Hives and Rash    Adhesives in bandaids    MEDICATIONS: Current Outpatient Prescriptions on File Prior to Visit  Medication Sig Dispense Refill  . acetaminophen (TYLENOL) 325 MG tablet Take 650 mg by mouth 2 (two) times daily as needed for moderate pain or headache. Reported on 04/17/2015    . calcium citrate-vitamin D (CITRACAL+D) 315-200 MG-UNIT tablet Take 1 tablet by mouth 3 (three) times daily.    . cholecalciferol (VITAMIN D) 1000 units tablet Take 1,000 Units by mouth daily.    . diphenhydrAMINE (BENADRYL) 25 MG tablet Take 25 mg by mouth daily as needed for allergies or sleep. Reported on 03/06/2015    . glucose blood (ONETOUCH VERIO) test strip 1 each by Other route as needed for other. Reported on 03/06/2015    . hydrochlorothiazide (HYDRODIURIL) 25 MG tablet TAKE 1 TABLET BY MOUTH EVERY DAY 30 tablet 11  . Insulin Glargine (BASAGLAR KWIKPEN) 100 UNIT/ML SOPN Inject 0.25 mLs (25 Units total) into the skin at bedtime. (Patient taking differently: Inject 28 Units into the skin at bedtime. ) 5 pen 0  . losartan (COZAAR) 100 MG tablet Take 1 tablet by mouth daily. Reported on 03/06/2015    . Melatonin 5 MG TABS  Take 5 mg by mouth at bedtime. Reported on 03/06/2015    . mometasone (NASONEX) 50 MCG/ACT nasal spray Place 1-2 sprays into the nose daily as needed (for allergies.).     [name] Pediatric Multivitamins-Iron (FLINTSTONES PLUS IRON) chewable tablet Chew 1 tablet by mouth 2 (two) times daily.    . Probiotic Product ([employer] PROBIOTIC PO) Take 1 capsule by mouth daily. Reported on 03/06/2015    . Propylene Glycol (SYSTANE BALANCE OP) Apply 1-2 drops to eye 3 (three) times daily.    [name] senna-docusate (SENNA PLUS) 8.6-50 MG tablet Take 2 tablets by mouth at bedtime.    . Vortioxetine HBr (TRINTELLIX) 20 MG TABS Take 20 mg by mouth at bedtime.      No current facility-administered medications on file prior to visit.     PAST MEDICAL HISTORY: Past Medical History:  Diagnosis Date  . Anemia    hx of  . Anxiety   . Arthritis    in fingers, shoulders  . Back pain   . Balance problem   . Breast cancer ([place])   . Breast cancer of upper-outer quadrant of right female breast ([place]) 07/22/2014  . Bunion, right foot   . Clumsiness   . Constipation   . Depression   . Diabetes mellitus without complication ([place])    76+ years  . Eating disorder    binge eating  .  Epistaxis 08/26/2014  . Fatty liver   . HCAP (healthcare-associated pneumonia) 12/18/2014  . Headache   . Heart murmur    never had any problems  . Hyperlipidemia   . Hypertension   . Joint pain   . Multiple food allergies   . Neuromuscular disorder ([place])    diabetic neuropathy  . Obesity   . Obstructive sleep apnea on CPAP    uses cpap nightly  . Ovarian cyst rupture    possible  . Pneumonia   . Pneumonia 11/2014  . Radiation 04/05/15-05/19/15   right chest wall 50.4 Gy, mastectomy scar 10 Gy  . Sleep apnea   . Thyroid nodule 06/2014    PAST SURGICAL HISTORY: Past Surgical History:  Procedure Laterality Date  . BIOPSY THYROID Bilateral 06/2014   2 nodules - Benign   . BREAST LUMPECTOMY WITH RADIOACTIVE SEED AND SENTINEL LYMPH NODE  BIOPSY Right 12/27/2014   Procedure: BREAST LUMPECTOMY WITH RADIOACTIVE SEED AND SENTINEL LYMPH NODE BIOPSY;  Surgeon: Stark Klein, MD;  Location: [facility];  Service: General;  Laterality: Right;  . BREAST REDUCTION SURGERY Bilateral 01/06/2015   Procedure: BILATERAL BREAST REDUCTION, RIGHT ONCOPLASTIC RECONSTRUCTION),LEFT BREAST REDUCTION FOR SYMMETRY;  Surgeon: [name], MD;  Location: [facility];  Service: Plastics;  Laterality: Bilateral;  . BREATH TEK H PYLORI N/A 09/15/2012   Procedure: BREATH TEK H PYLORI;  Surgeon: Pedro Earls, MD;  Location: [facility];  Service: General;  Laterality: N/A;  . BUNIONECTOMY Left 12/2013  . CARPAL TUNNEL RELEASE Right   . CARPAL TUNNEL RELEASE Left   . DUPUYTREN CONTRACTURE RELEASE    . GASTRIC ROUX-EN-Y N/A 11/02/2012   Procedure: LAPAROSCOPIC ROUX-EN-Y GASTRIC;  Surgeon: Pedro Earls, MD;  Location: [facility];  Service: General;  Laterality: N/A;  . IR GENERIC HISTORICAL  03/18/2016   IR US GUIDE VASC ACCESS LEFT 03/18/2016 Markus Daft, MD [facility]  . IR GENERIC HISTORICAL  03/18/2016   IR FLUORO GUIDE CV LINE LEFT 03/18/2016 Markus Daft, MD [facility]  . LAPAROSCOPIC APPENDECTOMY N/A 07/22/2015   Procedure: APPENDECTOMY LAPAROSCOPIC;  Surgeon: Michael Boston, MD;  Location: [facility];  Service: General;  Laterality: N/A;  . MASTECTOMY Right 02/15/2015   modified  . MODIFIED MASTECTOMY Right 02/15/2015   Procedure: RIGHT MODIFIED RADICAL MASTECTOMY;  Surgeon: Stark Klein, MD;  Location: [facility];  Service: General;  Laterality: Right;  . PORT-A-CATH REMOVAL Left 10/10/2015   Procedure: PORT REMOVAL;  Surgeon: Stark Klein, MD;  Location: [facility];  Service: General;  Laterality: Left;  . PORTACATH PLACEMENT Left 08/02/2014   Procedure: INSERTION PORT-A-CATH;  Surgeon: Stark Klein, MD;  Location: [facility];  Service: General;  Laterality: Left;  . ROTATOR CUFF REPAIR Right 2005  . SCAR REVISION Right 12/08/2015    Procedure: COMPLEX REPAIR RIGHT CHEST 10-15CM;  Surgeon: [name], MD;  Location: [facility];  Service: Plastics;  Laterality: Right;  . TOE SURGERY  1970s   bone spur  . TRIGGER FINGER RELEASE     x3    SOCIAL HISTORY: Social History  Substance Use Topics  . Smoking status: Never Smoker  . Smokeless tobacco: Never Used  . Alcohol use 0.0 oz/week     Comment: social    FAMILY HISTORY: Family History  Problem Relation Age of Onset  . CVA Mother   . Heart disease Mother   . Sudden death Mother   . Diabetes Father   . Heart failure Father   .  Hypertension Father   . Kidney disease Father   . Obesity Father   . Hypertension Sister   . Diabetes Brother   . Breast cancer Paternal Grandmother   . Diabetes Paternal Uncle     ROS: ROS  PHYSICAL EXAM: Height 5\' 9"  (1.753 m), weight 217 lb (98.4 kg), last menstrual period 10/22/2007. Body mass index is 32.05 kg/m. Physical Exam  RECENT LABS AND TESTS: BMET    Component Value Date/Time   NA 140 09/03/2016 0810   K 4.0 09/03/2016 0810   CL 104 08/09/2016 1229   CO2 23 09/03/2016 0810   GLUCOSE 227 (H) 09/03/2016 0810   BUN 37.8 (H) 09/03/2016 0810   CREATININE 1.5 (H) 09/03/2016 0810   CALCIUM 9.9 09/03/2016 0810   GFRNONAA 43 (L) 08/09/2016 1229   GFRNONAA 55 (L) 02/18/2013 0827   GFRAA 50 (L) 08/09/2016 1229   GFRAA 63 02/18/2013 0827   Lab Results  Component Value Date   HGBA1C 8.1 (H) 08/09/2016   HGBA1C 6.5 (H) 01/03/2015   HGBA1C 6.9 (H) 02/18/2013   HGBA1C 7.7 (H) 11/02/2012   Lab Results  Component Value Date   INSULIN 18.3 08/09/2016   CBC    Component Value Date/Time   WBC 3.8 (L) 09/03/2016 0810   WBC 5.4 10/10/2015 1007   RBC 3.90 09/03/2016 0810   RBC 3.13 (L) 10/10/2015 1007   HGB 11.5 (L) 09/03/2016 0810   HCT 34.5 (L) 09/03/2016 0810   PLT 72 (L) 09/03/2016 0810   MCV 88.5 09/03/2016 0810   MCH 29.5 09/03/2016 0810   MCH 33.5 10/10/2015 1007   MCHC 33.3  09/03/2016 0810   MCHC 33.0 10/10/2015 1007   RDW 14.8 (H) 09/03/2016 0810   LYMPHSABS 1.1 09/03/2016 0810   MONOABS 0.2 09/03/2016 0810   EOSABS 0.1 09/03/2016 0810   EOSABS 0.1 08/09/2016 1229   BASOSABS 0.0 09/03/2016 0810   Iron/TIBC/Ferritin/ %Sat    Component Value Date/Time   IRON 58 02/18/2013 0827   TIBC 305 02/18/2013 0827   FERRITIN 133 10/31/2014 0816   IRONPCTSAT 19 (L) 02/18/2013 0827   Lipid Panel     Component Value Date/Time   CHOL 182 08/09/2016 1229   TRIG 176 (H) 08/09/2016 1229   HDL 62 08/09/2016 1229   CHOLHDL 2.9 02/18/2013 0827   VLDL 23 02/18/2013 0827   LDLCALC 85 08/09/2016 1229   Hepatic Function Panel     Component Value Date/Time   PROT 6.8 09/03/2016 0810   ALBUMIN 3.3 (L) 09/03/2016 0810   AST 38 (H) 09/03/2016 0810   ALT 36 09/03/2016 0810   ALKPHOS 95 09/03/2016 0810   BILITOT 0.84 09/03/2016 0810      Component Value Date/Time   TSH 2.170 08/09/2016 1229    ASSESSMENT AND PLAN: Type 2 diabetes mellitus without complication, with long-term current use of insulin ([place]) - Plan: metFORMIN (GLUCOPHAGE) 500 MG tablet  Status post gastric surgery  At risk for heart disease  Class 1 obesity with serious comorbidity and body mass index (BMI) of 32.0 to 32.9 in adult, unspecified obesity type  PLAN: Diabetes II Kinzee has been given extensive diabetes education by myself today including ideal fasting and post-prandial blood glucose readings, individual ideal HgA1c goals  and hypoglycemia prevention. We discussed the importance of good blood sugar control to decrease the likelihood of diabetic complications such as nephropathy, neuropathy, limb loss, blindness, coronary artery disease, and death. We discussed the importance of intensive lifestyle modification including diet, exercise  and weight loss as the first line treatment for diabetes. Renette agrees to continue her diabetes medications and will follow up at the agreed upon  time.Will start metformin 500 mg qday and follow up in 2 weeks. Continue Lantus at 28 units qday.  I have reviewed the above documentation for accuracy and completeness, and I agree with the above. -Dennard Nip, MD     Office: [phone]  /  Fax: [phone]  OBESITY AND PREVENTATIVE COUNSELING BEHAVIORAL INTERVENTION VISIT  Today's visit was # 6 out of 65, BOT# 4  Starting weight: 225 lbs Starting date: 08/09/16 Today's weight : Weight: 217 lb (98.4 kg)  Today's date: 09/12/16 Total lbs lost to date: 8 lbs (Patients must lose 7 lbs in the first 6 months to continue with counseling)  PREVENTATIVE COUNSELING: Laquana is at high risk of developing multiple serious health conditions including uncontrolled diabetes, coronary artery disease, heart failure, sleep apnea, chronic pain, depression, obesity related cancers and more due to her weight. These risks have been discussed in depth and Jernie has agreed to work on the underlying disease of obesity to decrease the risk of developing any and all of these obesity related disease. Strategies for eating healthy for the holidays, celebrations and vacations were discussed at length today. Behavior modification strategies for overcoming feelings of guilt that may accompany overeating were also discussed at length today.   ASK: We discussed the diagnosis of obesity with [name] today and Mckaylee agreed to give [name] permission to discuss obesity behavioral modification therapy today.  ASSESS: Chonda has the diagnosis of obesity and her BMI today is 32.03 Kyung is in the action stage of change   ADVISE: Sayuri was educated on the multiple health risks of obesity as well as the benefit of weight loss to improve her health. She was advised of the need for long term treatment and the importance of lifestyle modifications.  AGREE: Multiple dietary modification options and treatment options were discussed and  Shateka  agreed to keep a food journal with 1300 to 1500 calories and 80+ protein  We discussed the following Behavioral Modification Stratagies today: increasing lean protein intake, decreasing simple carbohydrates , work on meal planning and easy cooking plans, holiday eating strategies  and avoiding temptations  We spent > than 50% of the 15 minute visit on the counseling as documented in the note.

## 2016-09-17 ENCOUNTER — Other Ambulatory Visit (INDEPENDENT_AMBULATORY_CARE_PROVIDER_SITE_OTHER): Payer: Self-pay | Admitting: Family Medicine

## 2016-09-17 ENCOUNTER — Encounter (INDEPENDENT_AMBULATORY_CARE_PROVIDER_SITE_OTHER): Payer: Self-pay | Admitting: Family Medicine

## 2016-09-17 ENCOUNTER — Ambulatory Visit
Admission: RE | Admit: 2016-09-17 | Discharge: 2016-09-17 | Disposition: A | Discharge status: Home / Self Care | Payer: Commercial Managed Care - PPO | Source: Ambulatory Visit | Attending: Radiation Oncology | Admitting: Radiation Oncology

## 2016-09-17 ENCOUNTER — Telehealth (INDEPENDENT_AMBULATORY_CARE_PROVIDER_SITE_OTHER): Payer: Self-pay | Admitting: Family Medicine

## 2016-09-17 ENCOUNTER — Encounter: Payer: Self-pay | Admitting: Podiatry

## 2016-09-17 DIAGNOSIS — C50411 Malignant neoplasm of upper-outer quadrant of right female breast: Secondary | ICD-10-CM

## 2016-09-17 DIAGNOSIS — Z171 Estrogen receptor negative status [ER-]: Secondary | ICD-10-CM

## 2016-09-17 DIAGNOSIS — Z510 Encounter for antineoplastic radiation therapy: Secondary | ICD-10-CM | POA: Diagnosis not present

## 2016-09-17 DIAGNOSIS — C50911 Malignant neoplasm of unspecified site of right female breast: Secondary | ICD-10-CM

## 2016-09-17 DIAGNOSIS — E1142 Type 2 diabetes mellitus with diabetic polyneuropathy: Secondary | ICD-10-CM

## 2016-09-17 DIAGNOSIS — Z794 Long term (current) use of insulin: Secondary | ICD-10-CM

## 2016-09-17 MED ORDER — BASAGLAR KWIKPEN 100 UNIT/ML ~~LOC~~ SOPN: 25.0000 [IU] | PEN_INJECTOR | Freq: Every day | SUBCUTANEOUS | 0 refills | Status: DC

## 2016-09-17 NOTE — Telephone Encounter (Signed)
LVM FOR PT TO CALL OFFICE - REMIND PT THAT COPAYS WILL APPLY AT HER UPCOMING APPTS

## 2016-09-18 ENCOUNTER — Encounter: Payer: Self-pay | Admitting: Radiation Oncology

## 2016-09-18 DIAGNOSIS — Z510 Encounter for antineoplastic radiation therapy: Secondary | ICD-10-CM | POA: Diagnosis not present

## 2016-09-19 NOTE — Progress Notes (Signed)
  Radiation Oncology         [phone] ________________________________  Name: Darlene [name] MRN: [id number]  Date: 09/17/2016  DOB: 1950/03/11  SIMULATION AND TREATMENT PLANNING NOTE    ICD-10-CM   1. Malignant neoplasm of upper-outer quadrant of right breast in female, estrogen receptor negative ([name]) C50.411    Z17.1   2. Recurrent breast cancer, right ([name]) C50.911     DIAGNOSIS:  ypT2 ypN0, stage IIB invasive ductal carcinoma, grade 2 of the right breast, now recurrent  NARRATIVE:  The patient was brought to the [facility].  Identity was confirmed.  All relevant records and images related to the planned course of therapy were reviewed.  The patient freely provided informed written consent to proceed with treatment after reviewing the details related to the planned course of therapy. The consent form was witnessed and verified by the simulation staff.  Then, the patient was set-up in a stable reproducible  supine position for radiation therapy.  CT images were obtained.  Surface markings were placed.  The CT images were loaded into the planning software.  Then the target and avoidance structures were contoured.  Treatment planning then occurred.  The radiation prescription was entered and confirmed.  Then, I designed and supervised the construction of a total of 3 medically necessary complex treatment devices.  I have requested : 3D Simulation  I have requested a DVH of the following structures: CTV, PTV, brachial plexus, lungs.  I have ordered:dose calc  PLAN:  The patient will receive 45 Gy in 25 fractions.  -----------------------------------  Blair Promise, PhD, MD

## 2016-09-20 ENCOUNTER — Ambulatory Visit (HOSPITAL_COMMUNITY)
Admission: RE | Admit: 2016-09-20 | Discharge: 2016-09-20 | Disposition: A | Discharge status: Home / Self Care | Payer: Commercial Managed Care - PPO | Source: Ambulatory Visit | Attending: Internal Medicine | Admitting: Internal Medicine

## 2016-09-20 DIAGNOSIS — E119 Type 2 diabetes mellitus without complications: Secondary | ICD-10-CM | POA: Insufficient documentation

## 2016-09-20 DIAGNOSIS — M79674 Pain in right toe(s): Secondary | ICD-10-CM | POA: Insufficient documentation

## 2016-09-20 DIAGNOSIS — M79675 Pain in left toe(s): Secondary | ICD-10-CM | POA: Insufficient documentation

## 2016-09-20 DIAGNOSIS — I739 Peripheral vascular disease, unspecified: Secondary | ICD-10-CM

## 2016-09-20 DIAGNOSIS — E785 Hyperlipidemia, unspecified: Secondary | ICD-10-CM | POA: Insufficient documentation

## 2016-09-20 DIAGNOSIS — M21619 Bunion of unspecified foot: Secondary | ICD-10-CM

## 2016-09-24 ENCOUNTER — Ambulatory Visit (HOSPITAL_BASED_OUTPATIENT_CLINIC_OR_DEPARTMENT_OTHER): Payer: Commercial Managed Care - PPO | Admitting: Oncology

## 2016-09-24 ENCOUNTER — Ambulatory Visit (HOSPITAL_BASED_OUTPATIENT_CLINIC_OR_DEPARTMENT_OTHER): Payer: Commercial Managed Care - PPO

## 2016-09-24 ENCOUNTER — Other Ambulatory Visit (HOSPITAL_BASED_OUTPATIENT_CLINIC_OR_DEPARTMENT_OTHER): Payer: Commercial Managed Care - PPO

## 2016-09-24 VITALS — BP 130/55 | HR 65 | Temp 98.0°F | Resp 18 | Ht 69.0 in | Wt 220.9 lb

## 2016-09-24 DIAGNOSIS — C50911 Malignant neoplasm of unspecified site of right female breast: Secondary | ICD-10-CM

## 2016-09-24 DIAGNOSIS — C50411 Malignant neoplasm of upper-outer quadrant of right female breast: Secondary | ICD-10-CM

## 2016-09-24 DIAGNOSIS — C773 Secondary and unspecified malignant neoplasm of axilla and upper limb lymph nodes: Secondary | ICD-10-CM | POA: Diagnosis not present

## 2016-09-24 DIAGNOSIS — Z171 Estrogen receptor negative status [ER-]: Secondary | ICD-10-CM

## 2016-09-24 DIAGNOSIS — Z5112 Encounter for antineoplastic immunotherapy: Secondary | ICD-10-CM | POA: Diagnosis not present

## 2016-09-24 LAB — CBC WITH DIFFERENTIAL/PLATELET
BASO%: 0.4 % (ref 0.0–2.0)
Basophils Absolute: 0 10*3/uL (ref 0.0–0.1)
EOS%: 3.4 % (ref 0.0–7.0)
Eosinophils Absolute: 0.2 10*3/uL (ref 0.0–0.5)
HCT: 33.4 % — ABNORMAL LOW (ref 34.8–46.6)
HGB: 11.4 g/dL — ABNORMAL LOW (ref 11.6–15.9)
LYMPH%: 21.4 % (ref 14.0–49.7)
MCH: 30.1 pg (ref 25.1–34.0)
MCHC: 34.1 g/dL (ref 31.5–36.0)
MCV: 88.4 fL (ref 79.5–101.0)
MONO#: 0.3 10*3/uL (ref 0.1–0.9)
MONO%: 7 % (ref 0.0–14.0)
NEUT#: 3.2 10*3/uL (ref 1.5–6.5)
NEUT%: 67.8 % (ref 38.4–76.8)
Platelets: 81 10*3/uL — ABNORMAL LOW (ref 145–400)
RBC: 3.78 10*6/uL (ref 3.70–5.45)
RDW: 15.5 % — ABNORMAL HIGH (ref 11.2–14.5)
WBC: 4.7 10*3/uL (ref 3.9–10.3)
lymph#: 1 10*3/uL (ref 0.9–3.3)

## 2016-09-24 LAB — COMPREHENSIVE METABOLIC PANEL WITH GFR
ALT: 51 U/L (ref 0–55)
AST: 41 U/L — ABNORMAL HIGH (ref 5–34)
Albumin: 3.3 g/dL — ABNORMAL LOW (ref 3.5–5.0)
Alkaline Phosphatase: 99 U/L (ref 40–150)
Anion Gap: 9 meq/L (ref 3–11)
BUN: 37.6 mg/dL — ABNORMAL HIGH (ref 7.0–26.0)
CO2: 24 meq/L (ref 22–29)
Calcium: 9.7 mg/dL (ref 8.4–10.4)
Chloride: 106 meq/L (ref 98–109)
Creatinine: 1.5 mg/dL — ABNORMAL HIGH (ref 0.6–1.1)
EGFR: 38 ml/min/1.73 m2 — ABNORMAL LOW
Glucose: 306 mg/dL — ABNORMAL HIGH (ref 70–140)
Potassium: 4.4 meq/L (ref 3.5–5.1)
Sodium: 139 meq/L (ref 136–145)
Total Bilirubin: 0.82 mg/dL (ref 0.20–1.20)
Total Protein: 6.7 g/dL (ref 6.4–8.3)

## 2016-09-24 MED ORDER — DIPHENHYDRAMINE HCL 25 MG PO CAPS
ORAL_CAPSULE | ORAL | Status: AC
  Filled 2016-09-24: qty 1

## 2016-09-24 MED ORDER — ACETAMINOPHEN 325 MG PO TABS
ORAL_TABLET | ORAL | Status: AC
  Filled 2016-09-24: qty 2

## 2016-09-24 MED ORDER — DIPHENHYDRAMINE HCL 12.5 MG/5ML PO ELIX
12.5000 mg | ORAL_SOLUTION | Freq: Once | ORAL | Status: AC
  Administered 2016-09-24: 12.5 mg via ORAL
  Filled 2016-09-24: qty 5

## 2016-09-24 MED ORDER — SODIUM CHLORIDE 0.9 % IV SOLN
600.0000 mg | Freq: Once | INTRAVENOUS | Status: AC
  Administered 2016-09-24: 600 mg via INTRAVENOUS
  Filled 2016-09-24: qty 28.57

## 2016-09-24 MED ORDER — DIPHENHYDRAMINE HCL 25 MG PO CAPS: 12.5000 mg | ORAL_CAPSULE | Freq: Once | ORAL | Status: DC

## 2016-09-24 MED ORDER — SODIUM CHLORIDE 0.9 % IV SOLN
Freq: Once | INTRAVENOUS | Status: AC
  Administered 2016-09-24: 10:00:00 via INTRAVENOUS

## 2016-09-24 MED ORDER — ACETAMINOPHEN 325 MG PO TABS
650.0000 mg | ORAL_TABLET | Freq: Once | ORAL | Status: AC
  Administered 2016-09-24: 650 mg via ORAL

## 2016-09-24 NOTE — Patient Instructions (Signed)
[facility] Discharge Instructions for Patients Receiving Chemotherapy  Today you received the following chemotherapy agents Herceptin  To help prevent nausea and vomiting after your treatment, we encourage you to take your nausea medication as directed   Trastuzumab injection for infusion What is this medicine? TRASTUZUMAB (tras TOO zoo mab) is a monoclonal antibody. It is used to treat breast cancer and stomach cancer. This medicine may be used for other purposes; ask your health care provider or pharmacist if you have questions. COMMON BRAND NAME(S): Herceptin What should I tell my health care provider before I take this medicine? They need to know if you have any of these conditions: -heart disease -heart failure -lung or breathing disease, like asthma -an unusual or allergic reaction to trastuzumab, benzyl alcohol, or other medications, foods, dyes, or preservatives -pregnant or trying to get pregnant -breast-feeding How should I use this medicine? This drug is given as an infusion into a vein. It is administered in a hospital or clinic by a specially trained health care professional. Talk to your pediatrician regarding the use of this medicine in children. This medicine is not approved for use in children. Overdosage: If you think you have taken too much of this medicine contact a poison control center or emergency room at once. NOTE: This medicine is only for you. Do not share this medicine with others. What if I miss a dose? It is important not to miss a dose. Call your doctor or health care professional if you are unable to keep an appointment. What may interact with this medicine? This medicine may interact with the following medications: -certain types of chemotherapy, such as daunorubicin, doxorubicin, epirubicin, and idarubicin This list may not describe all possible interactions. Give your health care provider a list of all the medicines, herbs,  non-prescription drugs, or dietary supplements you use. Also tell them if you smoke, drink alcohol, or use illegal drugs. Some items may interact with your medicine. What should I watch for while using this medicine? Visit your doctor for checks on your progress. Report any side effects. Continue your course of treatment even though you feel ill unless your doctor tells you to stop. Call your doctor or health care professional for advice if you get a fever, chills or sore throat, or other symptoms of a cold or flu. Do not treat yourself. Try to avoid being around people who are sick. You may experience fever, chills and shaking during your first infusion. These effects are usually mild and can be treated with other medicines. Report any side effects during the infusion to your health care professional. Fever and chills usually do not happen with later infusions. Do not become pregnant while taking this medicine or for 7 months after stopping it. Women should inform their doctor if they wish to become pregnant or think they might be pregnant. Women of child-bearing potential will need to have a negative pregnancy test before starting this medicine. There is a potential for serious side effects to an unborn child. Talk to your health care professional or pharmacist for more information. Do not breast-feed an infant while taking this medicine or for 7 months after stopping it. Women must use effective birth control with this medicine. What side effects may I notice from receiving this medicine? Side effects that you should report to your doctor or health care professional as soon as possible: -allergic reactions like skin rash, itching or hives, swelling of the face, lips, or tongue -chest pain or  palpitations -cough -dizziness -feeling faint or lightheaded, falls -fever -general ill feeling or flu-like symptoms -signs of worsening heart failure like breathing problems; swelling in your legs and  feet -unusually weak or tired Side effects that usually do not require medical attention (report to your doctor or health care professional if they continue or are bothersome): -bone pain -changes in taste -diarrhea -joint pain -nausea/vomiting -weight loss This list may not describe all possible side effects. Call your doctor for medical advice about side effects. You may report side effects to FDA at 1-800-FDA-1088. Where should I keep my medicine? This drug is given in a hospital or clinic and will not be stored at home. NOTE: This sheet is a summary. It may not cover all possible information. If you have questions about this medicine, talk to your doctor, pharmacist, or health care provider.  2018 Elsevier/Gold Standard (2016-01-02 14:37:52)    If you develop nausea and vomiting that is not controlled by your nausea medication, call the clinic.   BELOW ARE SYMPTOMS THAT SHOULD BE REPORTED IMMEDIATELY:  *FEVER GREATER THAN 100.5 F  *CHILLS WITH OR WITHOUT FEVER  NAUSEA AND VOMITING THAT IS NOT CONTROLLED WITH YOUR NAUSEA MEDICATION  *UNUSUAL SHORTNESS OF BREATH  *UNUSUAL BRUISING OR BLEEDING  TENDERNESS IN MOUTH AND THROAT WITH OR WITHOUT PRESENCE OF ULCERS  *URINARY PROBLEMS  *BOWEL PROBLEMS  UNUSUAL RASH Items with * indicate a potential emergency and should be followed up as soon as possible.  Feel free to call the clinic you have any questions or concerns. The clinic phone number is [phone].  Please show the [facility] at check-in to the Emergency Department and triage nurse.

## 2016-09-24 NOTE — Progress Notes (Signed)
[place]  [phone] [phone]) [phone]   ID: YEIMI DEBNAM DOB: 12-13-1950  MR#: [id number]  JHE#:[id number]  Patient Care Team: Vernie Shanks, MD as PCP - General (Family Medicine) Himmelrich, Bryson Ha, RD (Inactive) as Dietitian (Bariatrics) Stark Klein, MD as Consulting Physician (General Surgery) Magrinat, Virgie Dad, MD as Consulting Physician (Oncology) Arloa Koh, MD as Consulting Physician (Radiation Oncology) Mauro Kaufmann, RN as Registered Nurse Rockwell Germany, RN as Registered Nurse Jacelyn Pi, MD as Consulting Physician (Endocrinology) Kem Boroughs, Dennison as Nurse Practitioner (Family Medicine) [name], MD as Consulting Physician (Plastic Surgery) Larey Dresser, MD as Consulting Physician (Cardiology) Gery Pray, MD as Consulting Physician (Radiation Oncology) PCP: Vernie Shanks, MD OTHER MD:  CHIEF COMPLAINT:  HER-2 positive, estrogen receptor negative locally recurrent disease  CURRENT TREATMENT:  Trastuzumab, radiation therapy  INTERVAL HISTORY:  Darlene [name] returns today for follow-up of her HER-2/neu positive estrogen receptor negative breast cancer accompanied by her husband. Darlene [name] continues on trastuzumab every 3 weeks, which is her maintenance treatment. She has no side effects from this. At this point she does not wish to proceed to port. She will be due for repeat echocardiogram late this month  The 2 lesions in the right chest that we have been following did not clear with T-DM 1. She was referred to radiation oncology. She will be started this week and continue her radiation treatments through October 10.   REVIEW OF SYSTEMS: Darlene [name] made good use of the holiday weekend, going to 2 movies, having a party for 2 friends who completed 5 years of breast cancer follow-up, and having some other activities with friends. This is really great. It means she doesn't spend her time worrying and just being  anxious. Unfortunately was she did not do his exercise. She has a bunion and the right foot that is can require surgery and it keeps her from walking. She is not a member of edema present. She tells me her most recent hemoglobin A1c was greater than 8. A detailed review of systems today was otherwise stable  BREAST CANCER HISTORY: From the original intake note:  "Darlene [name]" had screening mammography at her gynecologist suggestive of a possible abnormality in the right breast. However right diagnostic mammogram 04/12/2013 at [facility] was negative. More recently however, she noted a lump in her right breast and brought it to her gynecologist's attention. On 07/18/2014 the patient had bilateral diagnostic mammography with tomosynthesis at [facility]. The breast density was category B. In the right breast upper outer quadrant there was an area of focal asymmetry with indistinct margins.[name] Ultrasound was obtained and showed in addition to the mass in question, measuring 1.7 cm, an abnormal-appearing lymph node in the right axillary tail.  On 07/19/2014 the patient underwent biopsy of the right breast massin question as well as a right axillary lymph node. Both were positive for invasive ductal carcinoma, grade 2, estrogen and progesterone receptor negative, with an MIB-1 of 90%, and HER-2 pending. Incidentally the lymph node biopsy showed a lymphocytic inflammatory component but no lymph node architecture.  Her subsequent history is as detailed below.  PAST MEDICAL HISTORY: Past Medical History:  Diagnosis Date  . Anemia    hx of  . Anxiety   . Arthritis    in fingers, shoulders  . Back pain   . Balance problem   . Breast cancer ([place])   . Breast cancer of upper-outer quadrant of right female breast ([place]) 07/22/2014  .  Bunion, right foot   . Clumsiness   . Constipation   . Depression   . Diabetes mellitus without complication ([place])    66 years  . Eating disorder    binge eating  . Epistaxis 08/26/2014  .  Fatty liver   . HCAP (healthcare-associated pneumonia) 12/18/2014  . Headache   . Heart murmur    never had any problems  . Hyperlipidemia   . Hypertension   . Joint pain   . Multiple food allergies   . Neuromuscular disorder ([place])    diabetic neuropathy  . Obesity   . Obstructive sleep apnea on CPAP    uses cpap nightly  . Ovarian cyst rupture    possible  . Pneumonia   . Pneumonia 11/2014  . Radiation 04/05/15-05/19/15   right chest wall 50.4 Gy, mastectomy scar 10 Gy  . Sleep apnea   . Thyroid nodule 06/2014    PAST SURGICAL HISTORY: Past Surgical History:  Procedure Laterality Date  . BIOPSY THYROID Bilateral 06/2014   2 nodules - Benign   . BREAST LUMPECTOMY WITH RADIOACTIVE SEED AND SENTINEL LYMPH NODE BIOPSY Right 12/27/2014   Procedure: BREAST LUMPECTOMY WITH RADIOACTIVE SEED AND SENTINEL LYMPH NODE BIOPSY;  Surgeon: Stark Klein, MD;  Location: [place];  Service: General;  Laterality: Right;  . BREAST REDUCTION SURGERY Bilateral 01/06/2015   Procedure: BILATERAL BREAST REDUCTION, RIGHT ONCOPLASTIC RECONSTRUCTION),LEFT BREAST REDUCTION FOR SYMMETRY;  Surgeon: [name], MD;  Location: [facility];  Service: Plastics;  Laterality: Bilateral;  . BREATH TEK H PYLORI N/A 09/15/2012   Procedure: BREATH TEK H PYLORI;  Surgeon: Pedro Earls, MD;  Location: [facility];  Service: General;  Laterality: N/A;  . BUNIONECTOMY Left 12/2013  . CARPAL TUNNEL RELEASE Right   . CARPAL TUNNEL RELEASE Left   . DUPUYTREN CONTRACTURE RELEASE    . GASTRIC ROUX-EN-Y N/A 11/02/2012   Procedure: LAPAROSCOPIC ROUX-EN-Y GASTRIC;  Surgeon: Pedro Earls, MD;  Location: [facility];  Service: General;  Laterality: N/A;  . IR GENERIC HISTORICAL  03/18/2016   IR US GUIDE VASC ACCESS LEFT 03/18/2016 Markus Daft, MD [facility]  . IR GENERIC HISTORICAL  03/18/2016   IR FLUORO GUIDE CV LINE LEFT 03/18/2016 Markus Daft, MD [facility]  . LAPAROSCOPIC APPENDECTOMY N/A 07/22/2015    Procedure: APPENDECTOMY LAPAROSCOPIC;  Surgeon: Michael Boston, MD;  Location: [facility];  Service: General;  Laterality: N/A;  . MASTECTOMY Right 02/15/2015   modified  . MODIFIED MASTECTOMY Right 02/15/2015   Procedure: RIGHT MODIFIED RADICAL MASTECTOMY;  Surgeon: Stark Klein, MD;  Location: [place];  Service: General;  Laterality: Right;  . PORT-A-CATH REMOVAL Left 10/10/2015   Procedure: PORT REMOVAL;  Surgeon: Stark Klein, MD;  Location: [place];  Service: General;  Laterality: Left;  . PORTACATH PLACEMENT Left 08/02/2014   Procedure: INSERTION PORT-A-CATH;  Surgeon: Stark Klein, MD;  Location: [place];  Service: General;  Laterality: Left;  . ROTATOR CUFF REPAIR Right 2005  . SCAR REVISION Right 12/08/2015   Procedure: COMPLEX REPAIR RIGHT CHEST 10-15CM;  Surgeon: [name], MD;  Location: [facility];  Service: Plastics;  Laterality: Right;  . TOE SURGERY  1970s   bone spur  . TRIGGER FINGER RELEASE     x3    FAMILY HISTORY Family History  Problem Relation Age of Onset  . CVA Mother   . Heart disease Mother   . Sudden death Mother   . Diabetes Father   .  Heart failure Father   . Hypertension Father   . Kidney disease Father   . Obesity Father   . Hypertension Sister   . Diabetes Brother   . Breast cancer Paternal Grandmother   . Diabetes Paternal Uncle    the patient's father died from complications of diabetes at age 5. The patient's mother died at age 67 with a subarachnoid hemorrhage. The patient had one brother, one sister. The only breast cancer was the patient's paternal grandmother diagnosed in her 47s. There is no history of ovarian cancer in the family.  GYNECOLOGIC HISTORY:  Patient's last menstrual period was 10/22/2007.  menarche age 70, the patient is GX P0. She went through the change of life in 2011. She did not take hormone replacement.  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 Her husband Maurine Minister is a retired Scientist, product/process development (he taught at [employer]).  Maurine Minister has 2 children from an earlier marriage, Marcie Bal who teaches in [place], and Sharnell Knight,  who works at the [employer] in in [place]. He has 1 grand son, aged 85. The patient and her husband are not church attender's    ADVANCED DIRECTIVES:  Not in place   HEALTH MAINTENANCE: Social History  Substance Use Topics  . Smoking status: Never Smoker  . Smokeless tobacco: Never Used  . Alcohol use 0.0 oz/week     Comment: social     Colonoscopy:  May 2016  PAP:  Bone density: 07/05/2013 normal, with a T score of 0.4  Lipid panel:  Allergies  Allergen Reactions  . Nsaids Other (See Comments)    H/o gastric bypass - avoid NSAIDs  . Tape Hives and Rash    Adhesives in bandaids    Current Outpatient Prescriptions  Medication Sig Dispense Refill  . acetaminophen (TYLENOL) 325 MG tablet Take 650 mg by mouth 2 (two) times daily as needed for moderate pain or headache. Reported on 04/17/2015    . calcium citrate-vitamin D (CITRACAL+D) 315-200 MG-UNIT tablet Take 1 tablet by mouth 3 (three) times daily.    . cholecalciferol (VITAMIN D) 1000 units tablet Take 1,000 Units by mouth daily.    . diphenhydrAMINE (BENADRYL) 25 MG tablet Take 25 mg by mouth daily as needed for allergies or sleep. Reported on 03/06/2015    . glucose blood (ONETOUCH VERIO) test strip 1 each by Other route as needed for other. Reported on 03/06/2015    . hydrochlorothiazide (HYDRODIURIL) 25 MG tablet TAKE 1 TABLET BY MOUTH EVERY DAY 30 tablet 11  . Insulin Glargine (BASAGLAR KWIKPEN) 100 UNIT/ML SOPN Inject 0.25 mLs (25 Units total) into the skin at bedtime. 5 pen 0  . losartan (COZAAR) 100 MG tablet Take 1 tablet by mouth daily. Reported on 03/06/2015    . Melatonin 5 MG TABS Take 5 mg by mouth at bedtime. Reported on 03/06/2015    . metFORMIN (GLUCOPHAGE) 500 MG tablet Take 1 tablet (500 mg  total) by mouth daily. 30 tablet 0  . mometasone (NASONEX) 50 MCG/ACT nasal spray Place 1-2 sprays into the nose daily as needed (for allergies.).     [name] Pediatric Multivitamins-Iron (FLINTSTONES PLUS IRON) chewable tablet Chew 1 tablet by mouth 2 (two) times daily.    . Probiotic Product ([employer] PROBIOTIC PO) Take 1 capsule by mouth daily. Reported on 03/06/2015    . Propylene Glycol (SYSTANE BALANCE OP) Apply 1-2 drops to eye 3 (three)  times daily.    [name] senna-docusate (SENNA PLUS) 8.6-50 MG tablet Take 2 tablets by mouth at bedtime.    . Vortioxetine HBr (TRINTELLIX) 20 MG TABS Take 20 mg by mouth at bedtime.      No current facility-administered medications for this visit.     OBJECTIVE: Middle-aged white woman In no acute distress  Vitals:   09/24/16 0904  BP: (!) 130/55  Pulse: 65  Resp: 18  Temp: 98 F (36.7 C)  SpO2: 94%     Body mass index is 32.62 kg/m.    ECOG FS:1 - Symptomatic but completely ambulatory Filed Weights   09/24/16 0904  Weight: 220 lb 14.4 oz (100.2 kg)   Sclerae unicteric, pupils round and equal Oropharynx clear and moist No cervical or supraclavicular adenopathy Lungs no rales or rhonchi Heart regular rate and rhythm Abd soft, nontender, positive bowel sounds MSK no focal spinal tenderness, no upper extremity lymphedema Neuro: nonfocal, well oriented, appropriate affect Breasts: The right breast is status post mastectomy. There is no evidence of chest wall recurrence. The right axilla is benign. The left breast is unremarkable. The left axilla is benign.  LAB RESULTS:  CMP     Component Value Date/Time   NA 139 09/24/2016 0852   K 4.4 09/24/2016 0852   CL 104 08/09/2016 1229   CO2 24 09/24/2016 0852   GLUCOSE 306 (H) 09/24/2016 0852   BUN 37.6 (H) 09/24/2016 0852   CREATININE 1.5 (H) 09/24/2016 0852   CALCIUM 9.7 09/24/2016 0852   PROT 6.7 09/24/2016 0852   ALBUMIN 3.3 (L) 09/24/2016 0852   AST 41 (H) 09/24/2016 0852   ALT 51 09/24/2016 0852     ALKPHOS 99 09/24/2016 0852   BILITOT 0.82 09/24/2016 0852   GFRNONAA 43 (L) 08/09/2016 1229   GFRNONAA 55 (L) 02/18/2013 0827   GFRAA 50 (L) 08/09/2016 1229   GFRAA 63 02/18/2013 0827    INo results found for: SPEP, UPEP  Lab Results  Component Value Date   WBC 4.7 09/24/2016   NEUTROABS 3.2 09/24/2016   HGB 11.4 (L) 09/24/2016   HCT 33.4 (L) 09/24/2016   MCV 88.4 09/24/2016   PLT 81 (L) 09/24/2016      Chemistry      Component Value Date/Time   NA 139 09/24/2016 0852   K 4.4 09/24/2016 0852   CL 104 08/09/2016 1229   CO2 24 09/24/2016 0852   BUN 37.6 (H) 09/24/2016 0852   CREATININE 1.5 (H) 09/24/2016 0852      Component Value Date/Time   CALCIUM 9.7 09/24/2016 0852   ALKPHOS 99 09/24/2016 0852   AST 41 (H) 09/24/2016 0852   ALT 51 09/24/2016 0852   BILITOT 0.82 09/24/2016 0852       No results found for: LABCA2  No components found for: MKGCH982  No results for input(s): INR in the last 168 hours.  Urinalysis    Component Value Date/Time   COLORURINE YELLOW 07/21/2015 2328   APPEARANCEUR CLEAR 07/21/2015 2328   LABSPEC 1.020 07/21/2015 2328   PHURINE 6.0 07/21/2015 2328   GLUCOSEU NEGATIVE 07/21/2015 2328   HGBUR TRACE (A) 07/21/2015 2328   BILIRUBINUR NEGATIVE 07/21/2015 2328   BILIRUBINUR neg 06/29/2013 1017   KETONESUR NEGATIVE 07/21/2015 2328   PROTEINUR 30 (A) 07/21/2015 2328   UROBILINOGEN negative 06/29/2013 1017   NITRITE NEGATIVE 07/21/2015 2328   LEUKOCYTESUR NEGATIVE 07/21/2015 2328    STUDIES: Dg Foot Complete Right  Result Date: 09/11/2016 Please see detailed radiograph  report in office note.   ASSESSMENT: 66 y.o. [place] woman status post right breast upper outer quadrant and right axillary lymph node biopsy 07/19/2014 for a cT2  pN1, stage IIB  invasive ductal carcinoma, grade 2, estrogen and progesterone receptor negative, with an MIB-1 of 90%, and HER-2 amplified  (1) neoadjuvant chemotherapy started 08/08/2014,  consisting of carboplatin, docetaxel, trastuzumab and pertuzumab given every 3 weeks 6, completed 11/21/2014  (a) breast MRI after initial 3 cycles shows a significant response  (b) breast MRI after 6 cycles shows a complete radiologic response   (2) trastuzumab continued through 10/02/2015 (to end of capecitabine therapy)  (a) echo 02/13/2015 shows an EF of 60-65%  (b)  Echo 05/09/2015 shows an ejection fraction of 60-65%  (c) echocardiogram 06/27/2015 shows an ejection fraction of 55-60%.  (3) right lumpectomy with sentinel lymph node biopsy on 12/27/14 showed a residual  ypT2 ypN1a, stage IIB invasive ductal carcinoma, grade 2, with repeat estrogen and progesterone receptors again negative  (4) status post right oncoplastic surgery (with left reduction mammoplasty) 01/06/2015 showing in the right breast multiple foci of intralymphatic carcinoma in random sections  (5)  Right modified radical mastectomy 02/15/2014 showed no residual invasive disease; there was DCIS but margins were negative; all 15 axillary lymph nodes were clear; ypTis ypN0  (a)  The patient has decided against reconstruction.  (6)  Postmastectomy radiation completed 05/18/2015 with capecitabine sensitization  (7) adjuvant capecitabine continued through 09/27/2015  (8) hepatic steatosis with increased fibrosis score (at risk for cirrhosis)  (9) thyroid biopsy (left lobe inferiorly) 2 shows benign tissue 06/28/2015  (10) LOCAL RECURRENCE:  (a) PET scan 12/06/2015 Notes a right retropectoral lymph node measuring 0.9 cm with an SUV max of 5.5  (b) repeat PET scan 03/11/2016 finds the right subpectoral lymph node to now measure 1.3 cm with an SUV max of 11.8.  (c) PET scan 05/07/2016 shows a significant response to T-DM 1  (11) started T-DM1 03/21/2016, completed 8 doses 08/13/2016  (a) PET scan 08/19/2016 shows a good response but measurable residual disease  (12) maintenance trastuzumab started 09/03/2016  (a)  repeat echocardiogram 07/08/2016 shows an ejection fraction of 60-65%.  (13)  radiation to the area of persistent disease in the right upper chest to be completed 10/30/2016  PLAN: Larita Fife is now just about a year out from her local recurrence of her breast cancer. She has local regional disease still despite her treatments with T-DM 1, and she is going to be receiving radiation treatments to that area beginning this week and continuing through 10/30/2016.  In the meantime we are continuing maintenance trastuzumab. We are not adding pertuzumab since we did T-DM 1 and while that had a significant impact on one of the 2 lesions we are following it did not have an impact on the other lesion. I do not believe adding pertuzumab would help in that regard. Of course if we had disease progression at some point in the future trastuzumab plus pertuzumab would be one of the options that we would consider  She is due for repeat echocardiogram late this month or early next month and that order is in place.  I have strongly encouraged her to exercise. Since she has foot problems she may need to join a gym so she can use an exercise bike or get into a swimming or water aerobics program  She will see me again 10/30/2016. At that point I will set her up for a repeat PET scan right  after Christmas.  She knows to call for any problems that may develop before the next visit MAGRINAT,GUSTAV C, MD   09/24/2016 9:26 AM

## 2016-09-25 ENCOUNTER — Encounter: Payer: Self-pay | Admitting: Neurology

## 2016-09-25 ENCOUNTER — Ambulatory Visit
Admission: RE | Admit: 2016-09-25 | Discharge: 2016-09-25 | Disposition: A | Discharge status: Home / Self Care | Payer: Commercial Managed Care - PPO | Source: Ambulatory Visit | Attending: Radiation Oncology | Admitting: Radiation Oncology

## 2016-09-25 ENCOUNTER — Encounter: Payer: Self-pay | Admitting: Radiation Oncology

## 2016-09-25 ENCOUNTER — Ambulatory Visit (INDEPENDENT_AMBULATORY_CARE_PROVIDER_SITE_OTHER): Payer: Commercial Managed Care - PPO | Admitting: Family Medicine

## 2016-09-25 VITALS — BP 126/74 | HR 66 | Temp 98.0°F | Ht 69.0 in | Wt 220.0 lb

## 2016-09-25 DIAGNOSIS — Z794 Long term (current) use of insulin: Secondary | ICD-10-CM

## 2016-09-25 DIAGNOSIS — E669 Obesity, unspecified: Secondary | ICD-10-CM

## 2016-09-25 DIAGNOSIS — Z9189 Other specified personal risk factors, not elsewhere classified: Secondary | ICD-10-CM

## 2016-09-25 DIAGNOSIS — E119 Type 2 diabetes mellitus without complications: Secondary | ICD-10-CM

## 2016-09-25 DIAGNOSIS — F3289 Other specified depressive episodes: Secondary | ICD-10-CM

## 2016-09-25 DIAGNOSIS — C50911 Malignant neoplasm of unspecified site of right female breast: Secondary | ICD-10-CM

## 2016-09-25 DIAGNOSIS — Z6832 Body mass index (BMI) 32.0-32.9, adult: Secondary | ICD-10-CM

## 2016-09-25 DIAGNOSIS — Z510 Encounter for antineoplastic radiation therapy: Secondary | ICD-10-CM | POA: Diagnosis not present

## 2016-09-25 MED ORDER — BUPROPION HCL ER (SR) 150 MG PO TB12: 150.0000 mg | ORAL_TABLET | Freq: Every day | ORAL | 0 refills | Status: DC

## 2016-09-25 NOTE — Progress Notes (Signed)
Office: [phone]  /  Fax: [phone]   HPI:   Chief Complaint: OBESITY Darlene [name] is here to discuss her progress with her obesity treatment plan. She is on the keep a food journal with 1300-1500 calories and 80+ grams of protein daily and is following her eating plan approximately 50 % of the time. She states she is exercising 0 minutes 0 times per week. Darlene [name] has gotten off track with journaling due to worsening Darlene [name]. She has decreased meal planning and increased snacking and eating out. She states she is frustrated and she is off track and wants to get back to healthier eating.  Her weight is 220 lb (99.8 kg) today and has gained 3 pounds since her last visit. She has lost 5 lbs since starting treatment with [name].  Diabetes II Darlene [name] has a diagnosis of diabetes type II. Darlene [name] states she has gotten off track with diet. Her recent BGs ranges between 93-254 on generic lantus. She denies any hypoglycemic episodes. Last A1c was 8.1 Hemoglobin A1C Latest Ref Rng & Units 08/09/2016  HGBA1C 4.8 - 5.6 % 8.1(H)  Some recent data might be hidden    She has been working on intensive lifestyle modifications including diet, exercise, and weight loss to help control her blood glucose levels.  At risk for cardiovascular disease Darlene [name] is at a higher than average risk for cardiovascular disease due to obesity and diabetes. She currently denies any chest pain.  Darlene [name] with emotional eating behaviors Darlene [name]'s mood has decreased in the last 2 weeks with recent Cancer recurrence and job bankruptcy and she became tearful in our office. She notes increase in emotional eating and using food for comfort to the extent that it is negatively impacting her health. She often snacks when she is not hungry. Darlene [name] sometimes feels she is out of control and then feels guilty that she made poor food choices. She has been working on behavior modification techniques to help reduce her emotional  eating and has been somewhat successful. She shows no sign of suicidal or homicidal ideations.  Darlene [name] screen Darlene [name] 2/9 09/12/2016 08/09/2016 01/01/2016 11/15/2015 10/12/2015  Decreased Interest 0 1 0 0 0  Down, Depressed, Hopeless 0 1 0 0 0  PHQ - 2 Score 0 2 0 0 0  Altered sleeping - 0 - - -  Tired, decreased energy - 1 - - -  Change in appetite - 2 - - -  Feeling bad or failure about yourself  - 2 - - -  Trouble concentrating - 1 - - -  Moving slowly or fidgety/restless - 1 - - -  Suicidal thoughts - 0 - - -  PHQ-9 Score - 9 - - -  Some recent data might be hidden           ALLERGIES: Allergies  Allergen Reactions  . Nsaids Other (See Comments)    H/o gastric bypass - avoid NSAIDs  . Tape Hives and Rash    Adhesives in bandaids    MEDICATIONS: Current Outpatient Prescriptions on File Prior to Visit  Medication Sig Dispense Refill  . acetaminophen (TYLENOL) 325 MG tablet Take 650 mg by mouth 2 (two) times daily as needed for moderate pain or headache. Reported on 04/17/2015    . calcium citrate-vitamin D (CITRACAL+D) 315-200 MG-UNIT tablet Take 1 tablet by mouth 3 (three) times daily.    . cholecalciferol (VITAMIN D) 1000 units tablet Take 1,000 Units by mouth daily.    . diphenhydrAMINE (BENADRYL) 25 MG tablet  Take 25 mg by mouth daily as needed for allergies or sleep. Reported on 03/06/2015    . glucose blood (ONETOUCH VERIO) test strip 1 each by Other route as needed for other. Reported on 03/06/2015    . hydrochlorothiazide (HYDRODIURIL) 25 MG tablet TAKE 1 TABLET BY MOUTH EVERY DAY 30 tablet 11  . Insulin Glargine (BASAGLAR KWIKPEN) 100 UNIT/ML SOPN Inject 0.25 mLs (25 Units total) into the skin at bedtime. 5 pen 0  . losartan (COZAAR) 100 MG tablet Take 1 tablet by mouth daily. Reported on 03/06/2015    . Melatonin 5 MG TABS Take 5 mg by mouth at bedtime. Reported on 03/06/2015    . metFORMIN (GLUCOPHAGE) 500 MG tablet Take 1 tablet (500 mg total) by mouth daily. 30  tablet 0  . mometasone (NASONEX) 50 MCG/ACT nasal spray Place 1-2 sprays into the nose daily as needed (for allergies.).     [name] Pediatric Multivitamins-Iron (FLINTSTONES PLUS IRON) chewable tablet Chew 1 tablet by mouth 2 (two) times daily.    . Probiotic Product ([employer] PROBIOTIC PO) Take 1 capsule by mouth daily. Reported on 03/06/2015    . Propylene Glycol (SYSTANE BALANCE OP) Apply 1-2 drops to eye 3 (three) times daily.    [name] senna-docusate (SENNA PLUS) 8.6-50 MG tablet Take 2 tablets by mouth at bedtime.    . Vortioxetine HBr (TRINTELLIX) 20 MG TABS Take 20 mg by mouth at bedtime.      No current facility-administered medications on file prior to visit.     PAST MEDICAL HISTORY: Past Medical History:  Diagnosis Date  . Anemia    hx of  . Anxiety   . Arthritis    in fingers, shoulders  . Back pain   . Balance problem   . Breast cancer ([place])   . Breast cancer of upper-outer quadrant of right female breast ([place]) 07/22/2014  . Bunion, right foot   . Clumsiness   . Constipation   . Darlene [name]   . Diabetes mellitus without complication ([place])    16+ years  . Eating disorder    binge eating  . Epistaxis 08/26/2014  . Fatty liver   . HCAP (healthcare-associated pneumonia) 12/18/2014  . Headache   . Heart murmur    never had any problems  . Hyperlipidemia   . Hypertension   . Joint pain   . Multiple food allergies   . Neuromuscular disorder ([place])    diabetic neuropathy  . Obesity   . Obstructive sleep apnea on CPAP    uses cpap nightly  . Ovarian cyst rupture    possible  . Pneumonia   . Pneumonia 11/2014  . Radiation 04/05/15-05/19/15   right chest wall 50.4 Gy, mastectomy scar 10 Gy  . Sleep apnea   . Thyroid nodule 06/2014    PAST SURGICAL HISTORY: Past Surgical History:  Procedure Laterality Date  . BIOPSY THYROID Bilateral 06/2014   2 nodules - Benign   . BREAST LUMPECTOMY WITH RADIOACTIVE SEED AND SENTINEL LYMPH NODE BIOPSY Right 12/27/2014   Procedure: BREAST  LUMPECTOMY WITH RADIOACTIVE SEED AND SENTINEL LYMPH NODE BIOPSY;  Surgeon: Stark Klein, MD;  Location: [facility];  Service: General;  Laterality: Right;  . BREAST REDUCTION SURGERY Bilateral 01/06/2015   Procedure: BILATERAL BREAST REDUCTION, RIGHT ONCOPLASTIC RECONSTRUCTION),LEFT BREAST REDUCTION FOR SYMMETRY;  Surgeon: [name], MD;  Location: [facility];  Service: Plastics;  Laterality: Bilateral;  . BREATH TEK H PYLORI N/A 09/15/2012   Procedure: BREATH TEK H PYLORI;  Surgeon: Pedro Earls, MD;  Location: [facility];  Service: General;  Laterality: N/A;  . BUNIONECTOMY Left 12/2013  . CARPAL TUNNEL RELEASE Right   . CARPAL TUNNEL RELEASE Left   . DUPUYTREN CONTRACTURE RELEASE    . GASTRIC ROUX-EN-Y N/A 11/02/2012   Procedure: LAPAROSCOPIC ROUX-EN-Y GASTRIC;  Surgeon: Pedro Earls, MD;  Location: [facility];  Service: General;  Laterality: N/A;  . IR GENERIC HISTORICAL  03/18/2016   IR US GUIDE VASC ACCESS LEFT 03/18/2016 Markus Daft, MD [facility]  . IR GENERIC HISTORICAL  03/18/2016   IR FLUORO GUIDE CV LINE LEFT 03/18/2016 Markus Daft, MD [facility]  . LAPAROSCOPIC APPENDECTOMY N/A 07/22/2015   Procedure: APPENDECTOMY LAPAROSCOPIC;  Surgeon: Michael Boston, MD;  Location: [facility];  Service: General;  Laterality: N/A;  . MASTECTOMY Right 02/15/2015   modified  . MODIFIED MASTECTOMY Right 02/15/2015   Procedure: RIGHT MODIFIED RADICAL MASTECTOMY;  Surgeon: Stark Klein, MD;  Location: [place];  Service: General;  Laterality: Right;  . PORT-A-CATH REMOVAL Left 10/10/2015   Procedure: PORT REMOVAL;  Surgeon: Stark Klein, MD;  Location: [place] [facility];  Service: General;  Laterality: Left;  . PORTACATH PLACEMENT Left 08/02/2014   Procedure: INSERTION PORT-A-CATH;  Surgeon: Stark Klein, MD;  Location: [place];  Service: General;  Laterality: Left;  . ROTATOR CUFF REPAIR Right 2005  . SCAR REVISION Right 12/08/2015   Procedure: COMPLEX REPAIR RIGHT CHEST 10-15CM;   Surgeon: [name], MD;  Location: [facility];  Service: Plastics;  Laterality: Right;  . TOE SURGERY  1970s   bone spur  . TRIGGER FINGER RELEASE     x3    SOCIAL HISTORY: Social History  Substance Use Topics  . Smoking status: Never Smoker  . Smokeless tobacco: Never Used  . Alcohol use 0.0 oz/week     Comment: social    FAMILY HISTORY: Family History  Problem Relation Age of Onset  . CVA Mother   . Heart disease Mother   . Sudden death Mother   . Diabetes Father   . Heart failure Father   . Hypertension Father   . Kidney disease Father   . Obesity Father   . Hypertension Sister   . Diabetes Brother   . Breast cancer Paternal Grandmother   . Diabetes Paternal Uncle     ROS: Review of Systems  Constitutional: Negative for weight loss.  Cardiovascular: Negative for chest pain.  Endo/Heme/Allergies:       Negative hypoglycemia   Psychiatric/Behavioral: Positive for Darlene [name]. Negative for suicidal ideas.    PHYSICAL EXAM: Blood pressure 126/74, pulse 66, temperature 98 F (36.7 C), temperature source Oral, height 5\' 9"  (1.753 m), weight 220 lb (99.8 kg), last menstrual period 10/22/2007, SpO2 96 %. Body mass index is 32.49 kg/m. Physical Exam  Constitutional: She is oriented to person, place, and time. She appears well-developed and well-nourished.  Cardiovascular: Normal rate.   Pulmonary/Chest: Effort normal.  Musculoskeletal: Normal range of motion.  Neurological: She is oriented to person, place, and time.  Skin: Skin is warm and dry.  Psychiatric: She has a normal mood and affect. Her behavior is normal.  Vitals reviewed.   RECENT LABS AND TESTS: BMET    Component Value Date/Time   NA 139 09/24/2016 0852   K 4.4 09/24/2016 0852   CL 104 08/09/2016 1229   CO2 24 09/24/2016 0852   GLUCOSE 306 (H) 09/24/2016 0852   BUN 37.6 (H) 09/24/2016 0852   CREATININE  1.5 (H) 09/24/2016 0852   CALCIUM 9.7 09/24/2016 0852   GFRNONAA 43  (L) 08/09/2016 1229   GFRNONAA 55 (L) 02/18/2013 0827   GFRAA 50 (L) 08/09/2016 1229   GFRAA 63 02/18/2013 0827   Lab Results  Component Value Date   HGBA1C 8.1 (H) 08/09/2016   HGBA1C 6.5 (H) 01/03/2015   HGBA1C 6.9 (H) 02/18/2013   HGBA1C 7.7 (H) 11/02/2012   Lab Results  Component Value Date   INSULIN 18.3 08/09/2016   CBC    Component Value Date/Time   WBC 4.7 09/24/2016 0852   WBC 5.4 10/10/2015 1007   RBC 3.78 09/24/2016 0852   RBC 3.13 (L) 10/10/2015 1007   HGB 11.4 (L) 09/24/2016 0852   HCT 33.4 (L) 09/24/2016 0852   PLT 81 (L) 09/24/2016 0852   MCV 88.4 09/24/2016 0852   MCH 30.1 09/24/2016 0852   MCH 33.5 10/10/2015 1007   MCHC 34.1 09/24/2016 0852   MCHC 33.0 10/10/2015 1007   RDW 15.5 (H) 09/24/2016 0852   LYMPHSABS 1.0 09/24/2016 0852   MONOABS 0.3 09/24/2016 0852   EOSABS 0.2 09/24/2016 0852   EOSABS 0.1 08/09/2016 1229   BASOSABS 0.0 09/24/2016 0852   Iron/TIBC/Ferritin/ %Sat    Component Value Date/Time   IRON 58 02/18/2013 0827   TIBC 305 02/18/2013 0827   FERRITIN 133 10/31/2014 0816   IRONPCTSAT 19 (L) 02/18/2013 0827   Lipid Panel     Component Value Date/Time   CHOL 182 08/09/2016 1229   TRIG 176 (H) 08/09/2016 1229   HDL 62 08/09/2016 1229   CHOLHDL 2.9 02/18/2013 0827   VLDL 23 02/18/2013 0827   LDLCALC 85 08/09/2016 1229   Hepatic Function Panel     Component Value Date/Time   PROT 6.7 09/24/2016 0852   ALBUMIN 3.3 (L) 09/24/2016 0852   AST 41 (H) 09/24/2016 0852   ALT 51 09/24/2016 0852   ALKPHOS 99 09/24/2016 0852   BILITOT 0.82 09/24/2016 0852      Component Value Date/Time   TSH 2.170 08/09/2016 1229    ASSESSMENT AND PLAN: Type 2 diabetes mellitus without complication, with long-term current use of insulin (HCC)  Other Darlene [name] - Plan: buPROPion (WELLBUTRIN SR) 150 MG 12 hr tablet  At risk for heart disease  Class 1 obesity with serious comorbidity and body mass index (BMI) of 32.0 to 32.9 in adult,  unspecified obesity type  PLAN:  Diabetes II Darlene [name] has been given extensive diabetes education by myself today including ideal fasting and post-prandial blood glucose readings, individual ideal Hgb A1c goals  and hypoglycemia prevention. We discussed the importance of good blood sugar control to decrease the likelihood of diabetic complications such as nephropathy, neuropathy, limb loss, blindness, coronary artery disease, and death. We discussed the importance of intensive lifestyle modification including diet, exercise and weight loss as the first line treatment for diabetes. Darlene [name] agrees to continue taking lantus as prescribed and get back to diet prescription. She will follow up with our clinic in 2 weeks.  Cardiovascular risk counselling Darlene [name] was given extended (15 minutes) coronary artery disease prevention counseling today. She is 66 y.o. female and has risk factors for heart disease including obesity and diabetes. We discussed intensive lifestyle modifications today with an emphasis on specific weight loss instructions and strategies. Pt was also informed of the importance of increasing exercise and decreasing saturated fats to help prevent heart disease.   Darlene [name] with Emotional Eating Behaviors We discussed behavior modification techniques today to help Darlene [name]  deal with her emotional eating and Darlene [name]. She has agreed to start taking Wellbutrin SR 150 mg q AM #30 with no refills. Darlene [name] agrees to continue taking trintellix and she will follow up with our clinic in 2 weeks.  Obesity Darlene [name] is currently in the action stage of change. As such, her goal is to continue with weight loss efforts She has agreed to keep a food journal with 1300-1500 calories and 80+ grams of  protein  Darlene [name] has been instructed to work up to a goal of 150 minutes of combined cardio and strengthening exercise per week for weight loss and overall health benefits. We discussed the following  Behavioral Modification Strategies today: increasing lean protein intake, decreasing simple carbohydrates, emotional eating strategies, and ways to avoid boredom eating   Darlene [name] has agreed to follow up with our clinic in 2 weeks. She was informed of the importance of frequent follow up visits to maximize her success with intensive lifestyle modifications for her multiple health conditions.  I, Darlene [name], am acting as transcriptionist for Darlene Nip, MD  I have reviewed the above documentation for accuracy and completeness, and I agree with the above. -Darlene Nip, MD     OBESITY BEHAVIORAL INTERVENTION VISIT  Today's visit was # 3 out of 22.  Starting weight: 225 lbs Starting date: 08/09/16 Today's weight: 220 lbs  Today's date: 09/25/16 Total lbs lost to date: 5 (Patients must lose 7 lbs in the first 6 months to continue with counseling)   ASK: We discussed the diagnosis of obesity with Darlene [name] today and Darlene [name] agreed to give [name] permission to discuss obesity behavioral modification therapy today.  ASSESS: Darlene [name] has the diagnosis of obesity and her BMI today is 55 Darlene [name] is in the action stage of change   ADVISE: Darlene [name] was educated on the multiple health risks of obesity as well as the benefit of weight loss to improve her health. She was advised of the need for long term treatment and the importance of lifestyle modifications.  AGREE: Multiple dietary modification options and treatment options were discussed and  Darlene [name] agreed to keep a food journal with 1300-1500 calories and 80+ grams of protein  We discussed the following Behavioral Modification Strategies today: increasing lean protein intake, decreasing simple carbohydrates, emotional eating strategies, and ways to avoid boredom eating

## 2016-09-26 ENCOUNTER — Ambulatory Visit: Payer: Commercial Managed Care - PPO | Admitting: Adult Health

## 2016-09-26 ENCOUNTER — Encounter: Payer: Self-pay | Admitting: Neurology

## 2016-09-26 ENCOUNTER — Ambulatory Visit (INDEPENDENT_AMBULATORY_CARE_PROVIDER_SITE_OTHER): Payer: Commercial Managed Care - PPO | Admitting: Neurology

## 2016-09-26 ENCOUNTER — Ambulatory Visit
Admission: RE | Admit: 2016-09-26 | Discharge: 2016-09-26 | Disposition: A | Discharge status: Home / Self Care | Payer: Commercial Managed Care - PPO | Source: Ambulatory Visit | Attending: Radiation Oncology | Admitting: Radiation Oncology

## 2016-09-26 ENCOUNTER — Other Ambulatory Visit: Payer: Self-pay | Admitting: Oncology

## 2016-09-26 ENCOUNTER — Telehealth: Payer: Self-pay

## 2016-09-26 VITALS — BP 124/65 | HR 58 | Ht 69.0 in | Wt 221.0 lb

## 2016-09-26 DIAGNOSIS — G4733 Obstructive sleep apnea (adult) (pediatric): Secondary | ICD-10-CM

## 2016-09-26 DIAGNOSIS — Z9989 Dependence on other enabling machines and devices: Secondary | ICD-10-CM | POA: Diagnosis not present

## 2016-09-26 DIAGNOSIS — Z510 Encounter for antineoplastic radiation therapy: Secondary | ICD-10-CM | POA: Diagnosis not present
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
## 2016-09-26 NOTE — Progress Notes (Unsigned)
Darlene [name]  [phone] [phone]   ID: Darlene [name] DOB: October 08, 1950  MR#: [id number]  TTS#:[id number]  Patient Care Team: Vernie Shanks, MD as PCP - General (Family Medicine) Himmelrich, Bryson Ha, RD (Inactive) as Dietitian (Bariatrics) Stark Klein, MD as Consulting Physician (General Surgery) Magrinat, Virgie Dad, MD as Consulting Physician (Oncology) Arloa Koh, MD as Consulting Physician (Radiation Oncology) Mauro Kaufmann, RN as Registered Nurse Rockwell Germany, RN as Registered Nurse Jacelyn Pi, MD as Consulting Physician (Endocrinology) Kem Boroughs, [place] as Nurse Practitioner (Family Medicine) [name], MD as Consulting Physician (Plastic Surgery) Larey Dresser, MD as Consulting Physician (Cardiology) Gery Pray, MD as Consulting Physician (Radiation Oncology) PCP: Vernie Shanks, MD OTHER MD:  CHIEF COMPLAINT:  HER-2 positive, estrogen receptor negative locally recurrent disease  CURRENT TREATMENT:  Trastuzumab, radiation therapy  INTERVAL HISTORY:  Darlene [name] returns today for follow-up of her HER-2/neu positive estrogen receptor negative breast cancer accompanied by her husband. Darlene [name] continues on trastuzumab every 3 weeks, which is her maintenance treatment. She has no side effects from this. At this point she does not wish to proceed to port. She will be due for repeat echocardiogram late this month  The 2 lesions in the right chest that we have been following did not clear with T-DM 1. She was referred to radiation oncology. She will be started this week and continue her radiation treatments through October 10.   REVIEW OF SYSTEMS: Darlene [name] made good use of the holiday weekend, going to 2 movies, having a party for 2 friends who completed 5 years of breast cancer follow-up, and having some other activities with friends. This is really great. It means she doesn't spend her time worrying and just being  anxious. Unfortunately was she did not do his exercise. She has a bunion and the right foot that is can require surgery and it keeps her from walking. She is not a member of edema present. She tells me her most recent hemoglobin A1c was greater than 8. A detailed review of systems today was otherwise stable  BREAST CANCER HISTORY: From the original intake note:  "Darlene [name]" had screening mammography at her gynecologist suggestive of a possible abnormality in the right breast. However right diagnostic mammogram 04/12/2013 at [facility] was negative. More recently however, she noted a lump in her right breast and brought it to her gynecologist's attention. On 07/18/2014 the patient had bilateral diagnostic mammography with tomosynthesis at [facility]. The breast density was category B. In the right breast upper outer quadrant there was an area of focal asymmetry with indistinct margins.[name] Ultrasound was obtained and showed in addition to the mass in question, measuring 1.7 cm, an abnormal-appearing lymph node in the right axillary tail.  On 07/19/2014 the patient underwent biopsy of the right breast massin question as well as a right axillary lymph node. Both were positive for invasive ductal carcinoma, grade 2, estrogen and progesterone receptor negative, with an MIB-1 of 90%, and HER-2 pending. Incidentally the lymph node biopsy showed a lymphocytic inflammatory component but no lymph node architecture.  Her subsequent history is as detailed below.  PAST MEDICAL HISTORY: Past Medical History:  Diagnosis Date  . Anemia    hx of  . Anxiety   . Arthritis    in fingers, shoulders  . Back pain   . Balance problem   . Breast cancer ([place])   . Breast cancer of upper-outer quadrant of right female breast ([place]) 07/22/2014  .  Bunion, right foot   . Clumsiness   . Constipation   . Depression   . Diabetes mellitus without complication ([place])    75+ years  . Eating disorder    binge eating  . Epistaxis 08/26/2014  .  Fatty liver   . HCAP (healthcare-associated pneumonia) 12/18/2014  . Headache   . Heart murmur    never had any problems  . Hyperlipidemia   . Hypertension   . Joint pain   . Multiple food allergies   . Neuromuscular disorder ([place])    diabetic neuropathy  . Obesity   . Obstructive sleep apnea on CPAP    uses cpap nightly  . Ovarian cyst rupture    possible  . Pneumonia   . Pneumonia 11/2014  . Radiation 04/05/15-05/19/15   right chest wall 50.4 Gy, mastectomy scar 10 Gy  . Sleep apnea   . Thyroid nodule 06/2014    PAST SURGICAL HISTORY: Past Surgical History:  Procedure Laterality Date  . BIOPSY THYROID Bilateral 06/2014   2 nodules - Benign   . BREAST LUMPECTOMY WITH RADIOACTIVE SEED AND SENTINEL LYMPH NODE BIOPSY Right 12/27/2014   Procedure: BREAST LUMPECTOMY WITH RADIOACTIVE SEED AND SENTINEL LYMPH NODE BIOPSY;  Surgeon: Stark Klein, MD;  Location: [place];  Service: General;  Laterality: Right;  . BREAST REDUCTION SURGERY Bilateral 01/06/2015   Procedure: BILATERAL BREAST REDUCTION, RIGHT ONCOPLASTIC RECONSTRUCTION),LEFT BREAST REDUCTION FOR SYMMETRY;  Surgeon: [name], MD;  Location: [place];  Service: Plastics;  Laterality: Bilateral;  . BREATH TEK H PYLORI N/A 09/15/2012   Procedure: BREATH TEK H PYLORI;  Surgeon: Pedro Earls, MD;  Location: [facility];  Service: General;  Laterality: N/A;  . BUNIONECTOMY Left 12/2013  . CARPAL TUNNEL RELEASE Right   . CARPAL TUNNEL RELEASE Left   . DUPUYTREN CONTRACTURE RELEASE    . GASTRIC ROUX-EN-Y N/A 11/02/2012   Procedure: LAPAROSCOPIC ROUX-EN-Y GASTRIC;  Surgeon: Pedro Earls, MD;  Location: [facility];  Service: General;  Laterality: N/A;  . IR GENERIC HISTORICAL  03/18/2016   IR US GUIDE VASC ACCESS LEFT 03/18/2016 Markus Daft, MD [facility]  . IR GENERIC HISTORICAL  03/18/2016   IR FLUORO GUIDE CV LINE LEFT 03/18/2016 Markus Daft, MD [facility]  . LAPAROSCOPIC APPENDECTOMY N/A 07/22/2015    Procedure: APPENDECTOMY LAPAROSCOPIC;  Surgeon: Michael Boston, MD;  Location: [facility];  Service: General;  Laterality: N/A;  . MASTECTOMY Right 02/15/2015   modified  . MODIFIED MASTECTOMY Right 02/15/2015   Procedure: RIGHT MODIFIED RADICAL MASTECTOMY;  Surgeon: Stark Klein, MD;  Location: [place];  Service: General;  Laterality: Right;  . PORT-A-CATH REMOVAL Left 10/10/2015   Procedure: PORT REMOVAL;  Surgeon: Stark Klein, MD;  Location: [place];  Service: General;  Laterality: Left;  . PORTACATH PLACEMENT Left 08/02/2014   Procedure: INSERTION PORT-A-CATH;  Surgeon: Stark Klein, MD;  Location: [place];  Service: General;  Laterality: Left;  . ROTATOR CUFF REPAIR Right 2005  . SCAR REVISION Right 12/08/2015   Procedure: COMPLEX REPAIR RIGHT CHEST 10-15CM;  Surgeon: [name], MD;  Location: [place];  Service: Plastics;  Laterality: Right;  . TOE SURGERY  1970s   bone spur  . TRIGGER FINGER RELEASE     x3    FAMILY HISTORY Family History  Problem Relation Age of Onset  . CVA Mother   . Heart disease Mother   . Sudden death Mother   . Diabetes Father   .  Heart failure Father   . Hypertension Father   . Kidney disease Father   . Obesity Father   . Hypertension Sister   . Diabetes Brother   . Breast cancer Paternal Grandmother   . Diabetes Paternal Uncle    the patient's father died from complications of diabetes at age 43. The patient's mother died at age 93 with a subarachnoid hemorrhage. The patient had one brother, one sister. The only breast cancer was the patient's paternal grandmother diagnosed in her 22s. There is no history of ovarian cancer in the family.  GYNECOLOGIC HISTORY:  Patient's last menstrual period was 10/22/2007.  menarche age 89, the patient is GX P0. She went through the change of life in 2011. She did not take hormone replacement.  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 Her husband [name] is a retired Visual merchandiser (he taught at [employer]).  [name] has 2 children from an earlier marriage, Rockey Situ who teaches in [place], and Stephine Langbehn,  who works at the [employer] in in [place]. He has 1 grand son, aged 80. The patient and her husband are not church attender's    ADVANCED DIRECTIVES:  Not in place   HEALTH MAINTENANCE: Social History  Substance Use Topics  . Smoking status: Never Smoker  . Smokeless tobacco: Never Used  . Alcohol use 0.0 oz/week     Comment: social     Colonoscopy:  May 2016  PAP:  Bone density: 07/05/2013 normal, with a T score of 0.4  Lipid panel:  Allergies  Allergen Reactions  . Nsaids Other (See Comments)    H/o gastric bypass - avoid NSAIDs  . Tape Hives and Rash    Adhesives in bandaids    Current Outpatient Prescriptions  Medication Sig Dispense Refill  . acetaminophen (TYLENOL) 325 MG tablet Take 650 mg by mouth 2 (two) times daily as needed for moderate pain or headache. Reported on 04/17/2015    . buPROPion (WELLBUTRIN SR) 150 MG 12 hr tablet Take 1 tablet (150 mg total) by mouth daily. 30 tablet 0  . calcium citrate-vitamin D (CITRACAL+D) 315-200 MG-UNIT tablet Take 1 tablet by mouth 3 (three) times daily.    . cholecalciferol (VITAMIN D) 1000 units tablet Take 1,000 Units by mouth daily.    . diphenhydrAMINE (BENADRYL) 25 MG tablet Take 25 mg by mouth daily as needed for allergies or sleep. Reported on 03/06/2015    . glucose blood (ONETOUCH VERIO) test strip 1 each by Other route as needed for other. Reported on 03/06/2015    . hydrochlorothiazide (HYDRODIURIL) 25 MG tablet TAKE 1 TABLET BY MOUTH EVERY DAY 30 tablet 11  . Insulin Glargine (BASAGLAR KWIKPEN) 100 UNIT/ML SOPN Inject 0.25 mLs (25 Units total) into the skin at bedtime. 5 pen 0  . losartan (COZAAR) 100 MG tablet Take 1 tablet by mouth daily. Reported on 03/06/2015    . Melatonin 5 MG TABS Take 5 mg  by mouth at bedtime. Reported on 03/06/2015    . metFORMIN (GLUCOPHAGE) 500 MG tablet Take 1 tablet (500 mg total) by mouth daily. 30 tablet 0  . mometasone (NASONEX) 50 MCG/ACT nasal spray Place 1-2 sprays into the nose daily as needed (for allergies.).     [name] Pediatric Multivitamins-Iron (FLINTSTONES PLUS IRON) chewable tablet Chew 1 tablet by mouth 2 (two) times daily.    . Probiotic Product ([employer] PROBIOTIC PO) Take 1 capsule  by mouth daily. Reported on 03/06/2015    . Propylene Glycol (SYSTANE BALANCE OP) Apply 1-2 drops to eye 3 (three) times daily.    [name] senna-docusate (SENNA PLUS) 8.6-50 MG tablet Take 2 tablets by mouth at bedtime.    . Vortioxetine HBr (TRINTELLIX) 20 MG TABS Take 20 mg by mouth at bedtime.      No current facility-administered medications for this visit.     OBJECTIVE: Middle-aged white woman In no acute distress  There were no vitals filed for this visit.   There is no height or weight on file to calculate BMI.    ECOG FS:1 - Symptomatic but completely ambulatory There were no vitals filed for this visit. Sclerae unicteric, pupils round and equal Oropharynx clear and moist No cervical or supraclavicular adenopathy Lungs no rales or rhonchi Heart regular rate and rhythm Abd soft, nontender, positive bowel sounds MSK no focal spinal tenderness, no upper extremity lymphedema Neuro: nonfocal, well oriented, appropriate affect Breasts: The right breast is status post mastectomy. There is no evidence of chest wall recurrence. The right axilla is benign. The left breast is unremarkable. The left axilla is benign.  LAB RESULTS:  CMP     Component Value Date/Time   NA 139 09/24/2016 0852   K 4.4 09/24/2016 0852   CL 104 08/09/2016 1229   CO2 24 09/24/2016 0852   GLUCOSE 306 (H) 09/24/2016 0852   BUN 37.6 (H) 09/24/2016 0852   CREATININE 1.5 (H) 09/24/2016 0852   CALCIUM 9.7 09/24/2016 0852   PROT 6.7 09/24/2016 0852   ALBUMIN 3.3 (L) 09/24/2016 0852   AST 41 (H)  09/24/2016 0852   ALT 51 09/24/2016 0852   ALKPHOS 99 09/24/2016 0852   BILITOT 0.82 09/24/2016 0852   GFRNONAA 43 (L) 08/09/2016 1229   GFRNONAA 55 (L) 02/18/2013 0827   GFRAA 50 (L) 08/09/2016 1229   GFRAA 63 02/18/2013 0827    INo results found for: SPEP, UPEP  Lab Results  Component Value Date   WBC 4.7 09/24/2016   NEUTROABS 3.2 09/24/2016   HGB 11.4 (L) 09/24/2016   HCT 33.4 (L) 09/24/2016   MCV 88.4 09/24/2016   PLT 81 (L) 09/24/2016      Chemistry      Component Value Date/Time   NA 139 09/24/2016 0852   K 4.4 09/24/2016 0852   CL 104 08/09/2016 1229   CO2 24 09/24/2016 0852   BUN 37.6 (H) 09/24/2016 0852   CREATININE 1.5 (H) 09/24/2016 0852      Component Value Date/Time   CALCIUM 9.7 09/24/2016 0852   ALKPHOS 99 09/24/2016 0852   AST 41 (H) 09/24/2016 0852   ALT 51 09/24/2016 0852   BILITOT 0.82 09/24/2016 0852       No results found for: LABCA2  No components found for: WVPXT062  No results for input(s): INR in the last 168 hours.  Urinalysis    Component Value Date/Time   COLORURINE YELLOW 07/21/2015 [place] 07/21/2015 2328   LABSPEC 1.020 07/21/2015 2328   PHURINE 6.0 07/21/2015 2328   GLUCOSEU NEGATIVE 07/21/2015 2328   HGBUR TRACE (A) 07/21/2015 2328   BILIRUBINUR NEGATIVE 07/21/2015 2328   BILIRUBINUR neg 06/29/2013 [place] 07/21/2015 2328   PROTEINUR 30 (A) 07/21/2015 2328   UROBILINOGEN negative 06/29/2013 1017   NITRITE NEGATIVE 07/21/2015 2328   LEUKOCYTESUR NEGATIVE 07/21/2015 2328    STUDIES: Dg Foot Complete Right  Result Date: 09/11/2016 Please see detailed radiograph  report in office note.   ASSESSMENT: 66 y.o. [place] woman status post right breast upper outer quadrant and right axillary lymph node biopsy 07/19/2014 for a cT2  pN1, stage IIB  invasive ductal carcinoma, grade 2, estrogen and progesterone receptor negative, with an MIB-1 of 90%, and HER-2 amplified  (1) neoadjuvant  chemotherapy started 08/08/2014, consisting of carboplatin, docetaxel, trastuzumab and pertuzumab given every 3 weeks 6, completed 11/21/2014  (a) breast MRI after initial 3 cycles shows a significant response  (b) breast MRI after 6 cycles shows a complete radiologic response   (2) trastuzumab continued through 10/02/2015 (to end of capecitabine therapy)  (a) echo 02/13/2015 shows an EF of 60-65%  (b)  Echo 05/09/2015 shows an ejection fraction of 60-65%  (c) echocardiogram 06/27/2015 shows an ejection fraction of 55-60%.  (3) right lumpectomy with sentinel lymph node biopsy on 12/27/14 showed a residual  ypT2 ypN1a, stage IIB invasive ductal carcinoma, grade 2, with repeat estrogen and progesterone receptors again negative  (4) status post right oncoplastic surgery (with left reduction mammoplasty) 01/06/2015 showing in the right breast multiple foci of intralymphatic carcinoma in random sections  (5)  Right modified radical mastectomy 02/15/2014 showed no residual invasive disease; there was DCIS but margins were negative; all 15 axillary lymph nodes were clear; ypTis ypN0  (a)  The patient has decided against reconstruction.  (6)  Postmastectomy radiation completed 05/18/2015 with capecitabine sensitization  (7) adjuvant capecitabine continued through 09/27/2015  (8) hepatic steatosis with increased fibrosis score (at risk for cirrhosis)  (9) thyroid biopsy (left lobe inferiorly) 2 shows benign tissue 06/28/2015  (10) LOCAL RECURRENCE:  (a) PET scan 12/06/2015 Notes a right retropectoral lymph node measuring 0.9 cm with an SUV max of 5.5  (b) repeat PET scan 03/11/2016 finds the right subpectoral lymph node to now measure 1.3 cm with an SUV max of 11.8.  (c) PET scan 05/07/2016 shows a significant response to T-DM 1  (11) started T-DM1 03/21/2016, completed 8 doses 08/13/2016  (a) PET scan 08/19/2016 shows a good response but measurable residual disease  (12) maintenance  trastuzumab started 09/03/2016  (a) repeat echocardiogram 07/08/2016 shows an ejection fraction of 60-65%.  (13)  radiation to the area of persistent disease in the right upper chest to be completed 10/30/2016  PLAN: Darlene [name] is now just about a year out from her local recurrence of her breast cancer. She has local regional disease still despite her treatments with T-DM 1, and she is going to be receiving radiation treatments to that area beginning this week and continuing through 10/30/2016.  In the meantime we are continuing maintenance trastuzumab. We are not adding pertuzumab since we did T-DM 1 and while that had a significant impact on one of the 2 lesions we are following it did not have an impact on the other lesion. I do not believe adding pertuzumab would help in that regard. Of course if we had disease progression at some point in the future trastuzumab plus pertuzumab would be one of the options that we would consider  She is due for repeat echocardiogram late this month or early next month and that order is in place.  I have strongly encouraged her to exercise. Since she has foot problems she may need to join a gym so she can use an exercise bike or get into a swimming or water aerobics program  She will see me again 10/30/2016. At that point I will set her up for a repeat PET scan right  after Christmas.  She knows to call for any problems that may develop before the next visit Chauncey Cruel, MD   09/26/2016 12:57 PM

## 2016-09-26 NOTE — Telephone Encounter (Signed)
Left VM for patient regarding an appt she had scheduled for an injection on 9/7. That appt was an error and it has been canceled. Patient informed.

## 2016-09-27 ENCOUNTER — Ambulatory Visit: Payer: Commercial Managed Care - PPO

## 2016-09-27 ENCOUNTER — Telehealth: Payer: Self-pay | Admitting: *Deleted

## 2016-09-27 ENCOUNTER — Ambulatory Visit
Admission: RE | Admit: 2016-09-27 | Discharge: 2016-09-27 | Disposition: A | Discharge status: Home / Self Care | Payer: Commercial Managed Care - PPO | Source: Ambulatory Visit | Attending: Radiation Oncology | Admitting: Radiation Oncology

## 2016-09-27 DIAGNOSIS — C50911 Malignant neoplasm of unspecified site of right female breast: Secondary | ICD-10-CM

## 2016-09-27 DIAGNOSIS — Z510 Encounter for antineoplastic radiation therapy: Secondary | ICD-10-CM | POA: Diagnosis not present

## 2016-09-27 MED ORDER — RADIAPLEXRX EX GEL
Freq: Once | CUTANEOUS | Status: AC
  Administered 2016-09-27: 10:00:00 via TOPICAL

## 2016-09-27 NOTE — Telephone Encounter (Addendum)
-----   Message from Trula Slade, DPM sent at 09/24/2016  7:34 AM EDT ----- Normal- please let her know. Thanks.09/27/2016-I informed pt of Dr. Leigh Aurora review of results.

## 2016-09-30 ENCOUNTER — Ambulatory Visit
Admission: RE | Admit: 2016-09-30 | Discharge: 2016-09-30 | Disposition: A | Discharge status: Home / Self Care | Payer: Commercial Managed Care - PPO | Source: Ambulatory Visit | Attending: Radiation Oncology | Admitting: Radiation Oncology

## 2016-09-30 DIAGNOSIS — Z510 Encounter for antineoplastic radiation therapy: Secondary | ICD-10-CM | POA: Diagnosis not present

## 2016-10-01 ENCOUNTER — Other Ambulatory Visit (HOSPITAL_BASED_OUTPATIENT_CLINIC_OR_DEPARTMENT_OTHER): Payer: Commercial Managed Care - PPO

## 2016-10-01 ENCOUNTER — Other Ambulatory Visit: Payer: Self-pay

## 2016-10-01 ENCOUNTER — Ambulatory Visit
Admission: RE | Admit: 2016-10-01 | Discharge: 2016-10-01 | Disposition: A | Discharge status: Home / Self Care | Payer: Commercial Managed Care - PPO | Source: Ambulatory Visit | Attending: Radiation Oncology | Admitting: Radiation Oncology

## 2016-10-01 DIAGNOSIS — R233 Spontaneous ecchymoses: Secondary | ICD-10-CM

## 2016-10-01 DIAGNOSIS — C50411 Malignant neoplasm of upper-outer quadrant of right female breast: Secondary | ICD-10-CM

## 2016-10-01 DIAGNOSIS — C773 Secondary and unspecified malignant neoplasm of axilla and upper limb lymph nodes: Principal | ICD-10-CM

## 2016-10-01 DIAGNOSIS — R238 Other skin changes: Secondary | ICD-10-CM

## 2016-10-01 DIAGNOSIS — Z510 Encounter for antineoplastic radiation therapy: Secondary | ICD-10-CM | POA: Diagnosis not present

## 2016-10-01 DIAGNOSIS — C50911 Malignant neoplasm of unspecified site of right female breast: Secondary | ICD-10-CM

## 2016-10-01 LAB — CBC WITH DIFFERENTIAL/PLATELET
BASO%: 0.4 % (ref 0.0–2.0)
Basophils Absolute: 0 10*3/uL (ref 0.0–0.1)
EOS%: 2.9 % (ref 0.0–7.0)
Eosinophils Absolute: 0.2 10*3/uL (ref 0.0–0.5)
HCT: 32.9 % — ABNORMAL LOW (ref 34.8–46.6)
HGB: 11.1 g/dL — ABNORMAL LOW (ref 11.6–15.9)
LYMPH%: 28.2 % (ref 14.0–49.7)
MCH: 29.9 pg (ref 25.1–34.0)
MCHC: 33.8 g/dL (ref 31.5–36.0)
MCV: 88.5 fL (ref 79.5–101.0)
MONO#: 0.4 10*3/uL (ref 0.1–0.9)
MONO%: 7.1 % (ref 0.0–14.0)
NEUT#: 3.2 10*3/uL (ref 1.5–6.5)
NEUT%: 61.4 % (ref 38.4–76.8)
Platelets: 98 10*3/uL — ABNORMAL LOW (ref 145–400)
RBC: 3.72 10*6/uL (ref 3.70–5.45)
RDW: 15.4 % — ABNORMAL HIGH (ref 11.2–14.5)
WBC: 5.2 10*3/uL (ref 3.9–10.3)
lymph#: 1.5 10*3/uL (ref 0.9–3.3)

## 2016-10-01 LAB — COMPREHENSIVE METABOLIC PANEL
ALT: 46 U/L (ref 0–55)
AST: 41 U/L — ABNORMAL HIGH (ref 5–34)
Albumin: 3.6 g/dL (ref 3.5–5.0)
Alkaline Phosphatase: 112 U/L (ref 40–150)
Anion Gap: 8 mEq/L (ref 3–11)
BUN: 38.8 mg/dL — ABNORMAL HIGH (ref 7.0–26.0)
CO2: 26 mEq/L (ref 22–29)
Calcium: 9.8 mg/dL (ref 8.4–10.4)
Chloride: 105 mEq/L (ref 98–109)
Creatinine: 1.5 mg/dL — ABNORMAL HIGH (ref 0.6–1.1)
EGFR: 36 mL/min/{1.73_m2} — ABNORMAL LOW (ref >90)
Glucose: 243 mg/dl — ABNORMAL HIGH (ref 70–140)
Potassium: 4.1 mEq/L (ref 3.5–5.1)
Sodium: 139 mEq/L (ref 136–145)
Total Bilirubin: 0.71 mg/dL (ref 0.20–1.20)
Total Protein: 7.1 g/dL (ref 6.4–8.3)

## 2016-10-01 LAB — PROTIME-INR
INR: 1 — ABNORMAL LOW (ref 2.00–3.50)
Protime: 12 Seconds (ref 10.6–13.4)

## 2016-10-02 ENCOUNTER — Ambulatory Visit
Admission: RE | Admit: 2016-10-02 | Discharge: 2016-10-02 | Disposition: A | Discharge status: Home / Self Care | Payer: Commercial Managed Care - PPO | Source: Ambulatory Visit | Attending: Radiation Oncology | Admitting: Radiation Oncology

## 2016-10-02 DIAGNOSIS — Z510 Encounter for antineoplastic radiation therapy: Secondary | ICD-10-CM | POA: Diagnosis not present

## 2016-10-02 LAB — APTT: aPTT: 30 s (ref 24–33)

## 2016-10-02 NOTE — Progress Notes (Signed)
Reviewed labs with pt.  Pt verbalizes no further bleeeding at this time.  Instructed pt to call if she notices any blood in her urine or stool.

## 2016-10-03 ENCOUNTER — Other Ambulatory Visit (INDEPENDENT_AMBULATORY_CARE_PROVIDER_SITE_OTHER): Payer: Self-pay

## 2016-10-03 ENCOUNTER — Ambulatory Visit
Admission: RE | Admit: 2016-10-03 | Discharge: 2016-10-03 | Disposition: A | Discharge status: Home / Self Care | Payer: Commercial Managed Care - PPO | Source: Ambulatory Visit | Attending: Radiation Oncology | Admitting: Radiation Oncology

## 2016-10-03 ENCOUNTER — Encounter (INDEPENDENT_AMBULATORY_CARE_PROVIDER_SITE_OTHER): Payer: Self-pay | Admitting: Family Medicine

## 2016-10-03 DIAGNOSIS — F3289 Other specified depressive episodes: Secondary | ICD-10-CM

## 2016-10-03 DIAGNOSIS — Z510 Encounter for antineoplastic radiation therapy: Secondary | ICD-10-CM | POA: Diagnosis not present

## 2016-10-03 MED ORDER — VORTIOXETINE HBR 20 MG PO TABS
20.0000 mg | ORAL_TABLET | Freq: Every day | ORAL | 0 refills | Status: DC
Start: 2016-10-03 — End: 2017-02-24

## 2016-10-04 ENCOUNTER — Ambulatory Visit
Admission: RE | Admit: 2016-10-04 | Discharge: 2016-10-04 | Disposition: A | Discharge status: Home / Self Care | Payer: Commercial Managed Care - PPO | Source: Ambulatory Visit | Attending: Radiation Oncology | Admitting: Radiation Oncology

## 2016-10-04 DIAGNOSIS — Z510 Encounter for antineoplastic radiation therapy: Secondary | ICD-10-CM | POA: Diagnosis not present

## 2016-10-07 ENCOUNTER — Ambulatory Visit
Admission: RE | Admit: 2016-10-07 | Discharge: 2016-10-07 | Disposition: A | Discharge status: Home / Self Care | Payer: Commercial Managed Care - PPO | Source: Ambulatory Visit | Attending: Radiation Oncology | Admitting: Radiation Oncology

## 2016-10-07 DIAGNOSIS — Z510 Encounter for antineoplastic radiation therapy: Secondary | ICD-10-CM | POA: Diagnosis not present

## 2016-10-08 ENCOUNTER — Ambulatory Visit
Admission: RE | Admit: 2016-10-08 | Discharge: 2016-10-08 | Disposition: A | Discharge status: Home / Self Care | Payer: Commercial Managed Care - PPO | Source: Ambulatory Visit | Attending: Radiation Oncology | Admitting: Radiation Oncology

## 2016-10-08 ENCOUNTER — Encounter (HOSPITAL_COMMUNITY): Payer: Self-pay | Admitting: Cardiology

## 2016-10-08 ENCOUNTER — Ambulatory Visit (HOSPITAL_BASED_OUTPATIENT_CLINIC_OR_DEPARTMENT_OTHER)
Admission: RE | Admit: 2016-10-08 | Discharge: 2016-10-08 | Disposition: A | Discharge status: Home / Self Care | Payer: Commercial Managed Care - PPO | Source: Ambulatory Visit | Attending: Cardiology | Admitting: Cardiology

## 2016-10-08 ENCOUNTER — Ambulatory Visit (HOSPITAL_COMMUNITY)
Admission: RE | Admit: 2016-10-08 | Discharge: 2016-10-08 | Disposition: A | Discharge status: Home / Self Care | Payer: Commercial Managed Care - PPO | Source: Ambulatory Visit | Attending: Internal Medicine | Admitting: Internal Medicine

## 2016-10-08 VITALS — BP 144/60 | HR 58 | Wt 220.4 lb

## 2016-10-08 DIAGNOSIS — I110 Hypertensive heart disease with heart failure: Secondary | ICD-10-CM | POA: Diagnosis not present

## 2016-10-08 DIAGNOSIS — E785 Hyperlipidemia, unspecified: Secondary | ICD-10-CM | POA: Insufficient documentation

## 2016-10-08 DIAGNOSIS — G620 Drug-induced polyneuropathy: Secondary | ICD-10-CM | POA: Diagnosis not present

## 2016-10-08 DIAGNOSIS — G4733 Obstructive sleep apnea (adult) (pediatric): Secondary | ICD-10-CM | POA: Diagnosis not present

## 2016-10-08 DIAGNOSIS — Z794 Long term (current) use of insulin: Secondary | ICD-10-CM | POA: Diagnosis not present

## 2016-10-08 DIAGNOSIS — C50411 Malignant neoplasm of upper-outer quadrant of right female breast: Secondary | ICD-10-CM | POA: Diagnosis not present

## 2016-10-08 DIAGNOSIS — I059 Rheumatic mitral valve disease, unspecified: Secondary | ICD-10-CM | POA: Diagnosis not present

## 2016-10-08 DIAGNOSIS — Z9011 Acquired absence of right breast and nipple: Secondary | ICD-10-CM | POA: Insufficient documentation

## 2016-10-08 DIAGNOSIS — Z9889 Other specified postprocedural states: Secondary | ICD-10-CM | POA: Diagnosis not present

## 2016-10-08 DIAGNOSIS — Z823 Family history of stroke: Secondary | ICD-10-CM | POA: Diagnosis not present

## 2016-10-08 DIAGNOSIS — I5030 Unspecified diastolic (congestive) heart failure: Secondary | ICD-10-CM | POA: Diagnosis not present

## 2016-10-08 DIAGNOSIS — Z803 Family history of malignant neoplasm of breast: Secondary | ICD-10-CM | POA: Diagnosis not present

## 2016-10-08 DIAGNOSIS — F419 Anxiety disorder, unspecified: Secondary | ICD-10-CM | POA: Diagnosis not present

## 2016-10-08 DIAGNOSIS — Z6832 Body mass index (BMI) 32.0-32.9, adult: Secondary | ICD-10-CM | POA: Insufficient documentation

## 2016-10-08 DIAGNOSIS — Z833 Family history of diabetes mellitus: Secondary | ICD-10-CM | POA: Diagnosis not present

## 2016-10-08 DIAGNOSIS — F329 Major depressive disorder, single episode, unspecified: Secondary | ICD-10-CM | POA: Diagnosis not present

## 2016-10-08 DIAGNOSIS — Z841 Family history of disorders of kidney and ureter: Secondary | ICD-10-CM | POA: Diagnosis not present

## 2016-10-08 DIAGNOSIS — C773 Secondary and unspecified malignant neoplasm of axilla and upper limb lymph nodes: Secondary | ICD-10-CM

## 2016-10-08 DIAGNOSIS — Z1501 Genetic susceptibility to malignant neoplasm of breast: Secondary | ICD-10-CM | POA: Insufficient documentation

## 2016-10-08 DIAGNOSIS — E1140 Type 2 diabetes mellitus with diabetic neuropathy, unspecified: Secondary | ICD-10-CM | POA: Insufficient documentation

## 2016-10-08 DIAGNOSIS — E669 Obesity, unspecified: Secondary | ICD-10-CM | POA: Diagnosis not present

## 2016-10-08 DIAGNOSIS — C50911 Malignant neoplasm of unspecified site of right female breast: Secondary | ICD-10-CM | POA: Diagnosis not present

## 2016-10-08 DIAGNOSIS — T451X5A Adverse effect of antineoplastic and immunosuppressive drugs, initial encounter: Secondary | ICD-10-CM | POA: Diagnosis not present

## 2016-10-08 DIAGNOSIS — Z171 Estrogen receptor negative status [ER-]: Secondary | ICD-10-CM | POA: Insufficient documentation

## 2016-10-08 DIAGNOSIS — Z8249 Family history of ischemic heart disease and other diseases of the circulatory system: Secondary | ICD-10-CM | POA: Insufficient documentation

## 2016-10-08 DIAGNOSIS — Z9109 Other allergy status, other than to drugs and biological substances: Secondary | ICD-10-CM | POA: Insufficient documentation

## 2016-10-08 DIAGNOSIS — Z510 Encounter for antineoplastic radiation therapy: Secondary | ICD-10-CM | POA: Diagnosis not present

## 2016-10-08 DIAGNOSIS — Z886 Allergy status to analgesic agent status: Secondary | ICD-10-CM | POA: Diagnosis not present

## 2016-10-08 NOTE — Patient Instructions (Signed)
Your physician has requested that you have an echocardiogram. Echocardiography is a painless test that uses sound waves to create images of your heart. It provides your doctor with information about the size and shape of your heart and how well your heart's chambers and valves are working. This procedure takes approximately one hour. There are no restrictions for this procedure.   Your physician recommends that you schedule a follow-up appointment in: 3 months with echocardiogram

## 2016-10-08 NOTE — Progress Notes (Signed)
Patient ID: Darlene [name], female   DOB: [birth year], 66 y.o.   MRN: [id number] Oncologist: Dr Darlene [name]   HPI: Darlene [name] is a 66 year old with history of R breast cancer, right axillary lymph node biopsy 07/19/2014 for a cT2 pN1, stage IIB invasive ductal carcinoma, grade 2, estrogen and progesterone receptor negative, with an MIB-1 of 90%, and HER-2 positive , OSA -> CPAP, bariatric surgery, and DM, referred to cardio-oncology clinic by Dr Darlene [name]  Completed neoadjuvant chemotherapy with carboplatin, docetaxel, trastuzumab and pertuzumab given every 3 weeks 6. Trastuzumab + capecitabine were completed in 9/17.  She had lumpectomy 12/16 and had mastectomy in 1/17.  She has completed radiation.   She was found in 11/17 to have a recurrence by PET => right retropectoral lymph node enlarged. In 2/18, she started on treatment with ado-trastuzumab emtansine, now back on Herceptin.  Echo prior to treatment showed stable EF.    She returns today for followup screening for Herceptin cardiomyopathy.  No chest pain, no exertional dyspnea.  BP mildly elevated.   Echo (7/16) with EF 60-65%, grade II diastolic dysfunction.  Echo (10/16) with EF 66%, GLS -17%. Echo (1/17) with EF 60-65%, grade II diastolic dysfunction, lateral s' 10, GLS -18.8% Echo (4/17) with EF 60-65%, grade II diastolic dysfunction, lateral s' 9.5, strain poorly traced and off axis, normal RV size and systolic function.  Echo (6/17) with EF 55-60%, aortic sclerosis without stenosis, difficult images for strain, probably not accurate.  Echo (10/17) with EF 55-60%, mild LVH, moderate diastolic dysfunction, normal RV size and systolic function.  Echo (2/18) with EF 55-60%, GLS -15% but technically difficult study Echo (6/18) with EF 60-65%, GLS -15% but technically difficult study.  Echo (9/18) with EF 55-60%, GLS -19.8%, grade II diastolic dysfunction.   Review of Systems: All systems reviewed and negative except as per HPI.    Past Medical History:  Diagnosis Date  . Anemia    hx of  . Anxiety   . Arthritis    in fingers, shoulders  . Back pain   . Balance problem   . Breast cancer ([place])   . Breast cancer of upper-outer quadrant of right female breast ([place]) 07/22/2014  . Bunion, right foot   . Clumsiness   . Constipation   . Depression   . Diabetes mellitus without complication ([place])    01+ years  . Eating disorder    binge eating  . Epistaxis 08/26/2014  . Fatty liver   . HCAP (healthcare-associated pneumonia) 12/18/2014  . Headache   . Heart murmur    never had any problems  . Hyperlipidemia   . Hypertension   . Joint pain   . Multiple food allergies   . Neuromuscular disorder ([place])    diabetic neuropathy  . Obesity   . Obstructive sleep apnea on CPAP    uses cpap nightly  . Ovarian cyst rupture    possible  . Pneumonia   . Pneumonia 11/2014  . Radiation 04/05/15-05/19/15   right chest wall 50.4 Gy, mastectomy scar 10 Gy  . Sleep apnea   . Thyroid nodule 06/2014    Current Outpatient Prescriptions  Medication Sig Dispense Refill  . acetaminophen (TYLENOL) 325 MG tablet Take 650 mg by mouth 2 (two) times daily as needed for moderate pain or headache. Reported on 04/17/2015    . buPROPion (WELLBUTRIN SR) 150 MG 12 hr tablet Take 1 tablet (150 mg total) by mouth daily. 30 tablet 0  . calcium  citrate-vitamin D (CITRACAL+D) 315-200 MG-UNIT tablet Take 1 tablet by mouth 3 (three) times daily.    . cholecalciferol (VITAMIN D) 1000 units tablet Take 1,000 Units by mouth daily.    . diphenhydrAMINE (BENADRYL) 25 MG tablet Take 25 mg by mouth daily as needed for allergies or sleep. Reported on 03/06/2015    . glucose blood (ONETOUCH VERIO) test strip 1 each by Other route as needed for other. Reported on 03/06/2015    . hyaluronate sodium (RADIAPLEXRX) GEL Apply 1 application topically 2 (two) times daily.    . hydrochlorothiazide (HYDRODIURIL) 25 MG tablet TAKE 1 TABLET BY MOUTH EVERY DAY 30 tablet  11  . Insulin Glargine (BASAGLAR KWIKPEN) 100 UNIT/ML SOPN Inject 0.25 mLs (25 Units total) into the skin at bedtime. 5 pen 0  . losartan (COZAAR) 100 MG tablet Take 1 tablet by mouth daily. Reported on 03/06/2015    . Melatonin 5 MG TABS Take 5 mg by mouth at bedtime. Reported on 03/06/2015    . metFORMIN (GLUCOPHAGE) 500 MG tablet Take 1 tablet (500 mg total) by mouth daily. 30 tablet 0  . mometasone (NASONEX) 50 MCG/ACT nasal spray Place 1-2 sprays into the nose daily as needed (for allergies.).     [name] Pediatric Multivitamins-Iron (FLINTSTONES PLUS IRON) chewable tablet Chew 1 tablet by mouth 2 (two) times daily.    . Probiotic Product ([employer] PROBIOTIC PO) Take 1 capsule by mouth daily. Reported on 03/06/2015    . Propylene Glycol (SYSTANE BALANCE OP) Apply 1-2 drops to eye 3 (three) times daily.    [name] senna-docusate (SENNA PLUS) 8.6-50 MG tablet Take 2 tablets by mouth at bedtime.    . vortioxetine HBr (TRINTELLIX) 20 MG TABS Take 20 mg by mouth at bedtime. 30 tablet 0   No current facility-administered medications for this encounter.     Allergies  Allergen Reactions  . Nsaids Other (See Comments)    H/o gastric bypass - avoid NSAIDs  . Tape Hives and Rash    Adhesives in bandaids      Social History   Social History  . Marital status: Married    Spouse name: Darlene [name]  . Number of children: 0  . 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 Smoking status: Never Smoker  . Smokeless tobacco: Never Used  . Alcohol use 0.0 oz/week     Comment: social  . Drug use: No  . Sexual activity: Yes    Birth control/ protection: Post-menopausal   Other Topics Concern  . Not on file   Social History Narrative   Patient is married Darlene [name]) and lives at home with her husband.   Patient does not have any children.   Patient is working for a [employer].   Patient has a Bachelor's degree.   Patient  is right-handed.   Patient does not drink any caffeine.      Family History  Problem Relation Age of Onset  . CVA Mother   . Heart disease Mother   . Sudden death Mother   . Diabetes Father   . Heart failure Father   . Hypertension Father   . Kidney disease Father   . Obesity Father   . Hypertension Sister   . Diabetes Brother   . Breast cancer Paternal Grandmother   . Diabetes Paternal Uncle     Vitals:   10/08/16 1159  BP: (!) 144/60  Pulse: [name])  58  SpO2: 100%  Weight: 220 lb 6.4 oz (100 kg)    PHYSICAL EXAM: General: NAD Neck: No JVD, no thyromegaly or thyroid nodule.  Lungs: Clear to auscultation bilaterally with normal respiratory effort. CV: Nondisplaced PMI.  Heart regular S1/S2, no S3/S4, no murmur.  No peripheral edema.  No carotid bruit.  Normal pedal pulses.  Abdomen: Soft, nontender, no hepatosplenomegaly, no distention.  Skin: Intact without lesions or rashes.  Neurologic: Alert and oriented x 3.  Psych: Normal affect. Extremities: No clubbing or cyanosis.  HEENT: Normal.   ASSESSMENT & PLAN:  1. Breast cancer: She has had recurrence and got ado-trastuzumab emtansine, now back on Herceptin.  I reviewed her echo today: EF and strain were stable.  - Repeat echo in 3 months with followup.  2. HTN: BP remains borderline elevated.  Will continue to follow for now.  3. OSA: Continue nightly CPAP   Loralie Champagne 10/08/2016

## 2016-10-08 NOTE — Progress Notes (Signed)
  Echocardiogram 2D Echocardiogram has been performed.  Tresa Res 10/08/2016, 12:06 PM

## 2016-10-09 ENCOUNTER — Ambulatory Visit
Admission: RE | Admit: 2016-10-09 | Discharge: 2016-10-09 | Disposition: A | Discharge status: Home / Self Care | Payer: Commercial Managed Care - PPO | Source: Ambulatory Visit | Attending: Radiation Oncology | Admitting: Radiation Oncology

## 2016-10-09 DIAGNOSIS — Z510 Encounter for antineoplastic radiation therapy: Secondary | ICD-10-CM | POA: Diagnosis not present

## 2016-10-10 ENCOUNTER — Ambulatory Visit
Admission: RE | Admit: 2016-10-10 | Discharge: 2016-10-10 | Disposition: A | Discharge status: Home / Self Care | Payer: Commercial Managed Care - PPO | Source: Ambulatory Visit | Attending: Radiation Oncology | Admitting: Radiation Oncology

## 2016-10-10 DIAGNOSIS — Z510 Encounter for antineoplastic radiation therapy: Secondary | ICD-10-CM | POA: Diagnosis not present

## 2016-10-11 ENCOUNTER — Ambulatory Visit
Admission: RE | Admit: 2016-10-11 | Discharge: 2016-10-11 | Disposition: A | Discharge status: Home / Self Care | Payer: Commercial Managed Care - PPO | Source: Ambulatory Visit | Attending: Radiation Oncology | Admitting: Radiation Oncology

## 2016-10-11 DIAGNOSIS — Z510 Encounter for antineoplastic radiation therapy: Secondary | ICD-10-CM | POA: Diagnosis not present

## 2016-10-14 ENCOUNTER — Ambulatory Visit (INDEPENDENT_AMBULATORY_CARE_PROVIDER_SITE_OTHER): Payer: Commercial Managed Care - PPO | Admitting: Family Medicine

## 2016-10-14 ENCOUNTER — Ambulatory Visit
Admission: RE | Admit: 2016-10-14 | Discharge: 2016-10-14 | Disposition: A | Discharge status: Home / Self Care | Payer: Commercial Managed Care - PPO | Source: Ambulatory Visit | Attending: Radiation Oncology | Admitting: Radiation Oncology

## 2016-10-14 ENCOUNTER — Encounter (INDEPENDENT_AMBULATORY_CARE_PROVIDER_SITE_OTHER): Payer: Self-pay | Admitting: Family Medicine

## 2016-10-14 VITALS — BP 130/75 | HR 63 | Temp 97.9°F | Ht 69.0 in | Wt 219.0 lb

## 2016-10-14 DIAGNOSIS — E669 Obesity, unspecified: Secondary | ICD-10-CM

## 2016-10-14 DIAGNOSIS — Z9884 Bariatric surgery status: Secondary | ICD-10-CM | POA: Diagnosis not present

## 2016-10-14 DIAGNOSIS — E119 Type 2 diabetes mellitus without complications: Secondary | ICD-10-CM | POA: Diagnosis not present

## 2016-10-14 DIAGNOSIS — Z510 Encounter for antineoplastic radiation therapy: Secondary | ICD-10-CM | POA: Diagnosis not present

## 2016-10-14 DIAGNOSIS — Z794 Long term (current) use of insulin: Secondary | ICD-10-CM

## 2016-10-14 DIAGNOSIS — Z9189 Other specified personal risk factors, not elsewhere classified: Secondary | ICD-10-CM

## 2016-10-14 DIAGNOSIS — Z6832 Body mass index (BMI) 32.0-32.9, adult: Secondary | ICD-10-CM

## 2016-10-14 MED ORDER — BASAGLAR KWIKPEN 100 UNIT/ML ~~LOC~~ SOPN: 25.0000 [IU] | PEN_INJECTOR | Freq: Every day | SUBCUTANEOUS | 0 refills | Status: DC

## 2016-10-14 MED ORDER — METFORMIN HCL 500 MG PO TABS: 500.0000 mg | ORAL_TABLET | Freq: Every day | ORAL | 0 refills | Status: DC

## 2016-10-15 ENCOUNTER — Ambulatory Visit (HOSPITAL_BASED_OUTPATIENT_CLINIC_OR_DEPARTMENT_OTHER): Payer: Commercial Managed Care - PPO

## 2016-10-15 ENCOUNTER — Ambulatory Visit
Admission: RE | Admit: 2016-10-15 | Discharge: 2016-10-15 | Disposition: A | Discharge status: Home / Self Care | Payer: Commercial Managed Care - PPO | Source: Ambulatory Visit | Attending: Radiation Oncology | Admitting: Radiation Oncology

## 2016-10-15 ENCOUNTER — Other Ambulatory Visit (HOSPITAL_BASED_OUTPATIENT_CLINIC_OR_DEPARTMENT_OTHER): Payer: Commercial Managed Care - PPO

## 2016-10-15 VITALS — BP 133/57 | HR 55 | Temp 98.1°F | Resp 18

## 2016-10-15 DIAGNOSIS — C50411 Malignant neoplasm of upper-outer quadrant of right female breast: Secondary | ICD-10-CM

## 2016-10-15 DIAGNOSIS — C773 Secondary and unspecified malignant neoplasm of axilla and upper limb lymph nodes: Principal | ICD-10-CM

## 2016-10-15 DIAGNOSIS — Z23 Encounter for immunization: Secondary | ICD-10-CM

## 2016-10-15 DIAGNOSIS — Z5112 Encounter for antineoplastic immunotherapy: Secondary | ICD-10-CM

## 2016-10-15 DIAGNOSIS — C50911 Malignant neoplasm of unspecified site of right female breast: Secondary | ICD-10-CM

## 2016-10-15 DIAGNOSIS — Z510 Encounter for antineoplastic radiation therapy: Secondary | ICD-10-CM | POA: Diagnosis not present

## 2016-10-15 DIAGNOSIS — Z171 Estrogen receptor negative status [ER-]: Secondary | ICD-10-CM

## 2016-10-15 LAB — CBC WITH DIFFERENTIAL/PLATELET
BASO%: 0.4 % (ref 0.0–2.0)
Basophils Absolute: 0 10*3/uL (ref 0.0–0.1)
EOS%: 3 % (ref 0.0–7.0)
Eosinophils Absolute: 0.1 10*3/uL (ref 0.0–0.5)
HCT: 33.3 % — ABNORMAL LOW (ref 34.8–46.6)
HGB: 11.3 g/dL — ABNORMAL LOW (ref 11.6–15.9)
LYMPH%: 21.4 % (ref 14.0–49.7)
MCH: 30 pg (ref 25.1–34.0)
MCHC: 34 g/dL (ref 31.5–36.0)
MCV: 88.3 fL (ref 79.5–101.0)
MONO#: 0.3 10*3/uL (ref 0.1–0.9)
MONO%: 6.7 % (ref 0.0–14.0)
NEUT#: 3.2 10*3/uL (ref 1.5–6.5)
NEUT%: 68.5 % (ref 38.4–76.8)
Platelets: 93 10*3/uL — ABNORMAL LOW (ref 145–400)
RBC: 3.77 10*6/uL (ref 3.70–5.45)
RDW: 15.7 % — ABNORMAL HIGH (ref 11.2–14.5)
WBC: 4.6 10*3/uL (ref 3.9–10.3)
lymph#: 1 10*3/uL (ref 0.9–3.3)

## 2016-10-15 LAB — COMPREHENSIVE METABOLIC PANEL
ALT: 45 U/L (ref 0–55)
AST: 39 U/L — ABNORMAL HIGH (ref 5–34)
Albumin: 3.5 g/dL (ref 3.5–5.0)
Alkaline Phosphatase: 112 U/L (ref 40–150)
Anion Gap: 7 mEq/L (ref 3–11)
BUN: 37.6 mg/dL — ABNORMAL HIGH (ref 7.0–26.0)
CO2: 24 mEq/L (ref 22–29)
Calcium: 9.6 mg/dL (ref 8.4–10.4)
Chloride: 110 mEq/L — ABNORMAL HIGH (ref 98–109)
Creatinine: 1.4 mg/dL — ABNORMAL HIGH (ref 0.6–1.1)
EGFR: 39 mL/min/{1.73_m2} — ABNORMAL LOW (ref >90)
Glucose: 189 mg/dl — ABNORMAL HIGH (ref 70–140)
Potassium: 4.2 mEq/L (ref 3.5–5.1)
Sodium: 141 mEq/L (ref 136–145)
Total Bilirubin: 0.74 mg/dL (ref 0.20–1.20)
Total Protein: 6.9 g/dL (ref 6.4–8.3)

## 2016-10-15 MED ORDER — SODIUM CHLORIDE 0.9 % IV SOLN
Freq: Once | INTRAVENOUS | Status: AC
  Administered 2016-10-15: 10:00:00 via INTRAVENOUS

## 2016-10-15 MED ORDER — ACETAMINOPHEN 325 MG PO TABS
ORAL_TABLET | ORAL | Status: AC
  Filled 2016-10-15: qty 2

## 2016-10-15 MED ORDER — INFLUENZA VAC SPLIT QUAD 0.5 ML IM SUSY
0.5000 mL | PREFILLED_SYRINGE | Freq: Once | INTRAMUSCULAR | Status: AC
  Administered 2016-10-15: 0.5 mL via INTRAMUSCULAR
  Filled 2016-10-15: qty 0.5

## 2016-10-15 MED ORDER — DIPHENHYDRAMINE HCL 12.5 MG/5ML PO ELIX
12.5000 mg | ORAL_SOLUTION | Freq: Once | ORAL | Status: AC
  Administered 2016-10-15: 12.5 mg via ORAL
  Filled 2016-10-15: qty 5

## 2016-10-15 MED ORDER — ACETAMINOPHEN 325 MG PO TABS
650.0000 mg | ORAL_TABLET | Freq: Once | ORAL | Status: AC
  Administered 2016-10-15: 650 mg via ORAL

## 2016-10-15 MED ORDER — TRASTUZUMAB CHEMO 150 MG IV SOLR
600.0000 mg | Freq: Once | INTRAVENOUS | Status: AC
  Administered 2016-10-15: 600 mg via INTRAVENOUS
  Filled 2016-10-15: qty 28.57

## 2016-10-15 NOTE — Patient Instructions (Signed)
[facility] Discharge Instructions for Patients Receiving Chemotherapy  Today you received the following chemotherapy agents Herceptin  To help prevent nausea and vomiting after your treatment, we encourage you to take your nausea medication as directed   Trastuzumab injection for infusion What is this medicine? TRASTUZUMAB (tras TOO zoo mab) is a monoclonal antibody. It is used to treat breast cancer and stomach cancer. This medicine may be used for other purposes; ask your health care provider or pharmacist if you have questions. COMMON BRAND NAME(S): Herceptin What should I tell my health care provider before I take this medicine? They need to know if you have any of these conditions: -heart disease -heart failure -lung or breathing disease, like asthma -an unusual or allergic reaction to trastuzumab, benzyl alcohol, or other medications, foods, dyes, or preservatives -pregnant or trying to get pregnant -breast-feeding How should I use this medicine? This drug is given as an infusion into a vein. It is administered in a hospital or clinic by a specially trained health care professional. Talk to your pediatrician regarding the use of this medicine in children. This medicine is not approved for use in children. Overdosage: If you think you have taken too much of this medicine contact a poison control center or emergency room at once. NOTE: This medicine is only for you. Do not share this medicine with others. What if I miss a dose? It is important not to miss a dose. Call your doctor or health care professional if you are unable to keep an appointment. What may interact with this medicine? This medicine may interact with the following medications: -certain types of chemotherapy, such as daunorubicin, doxorubicin, epirubicin, and idarubicin This list may not describe all possible interactions. Give your health care provider a list of all the medicines, herbs,  non-prescription drugs, or dietary supplements you use. Also tell them if you smoke, drink alcohol, or use illegal drugs. Some items may interact with your medicine. What should I watch for while using this medicine? Visit your doctor for checks on your progress. Report any side effects. Continue your course of treatment even though you feel ill unless your doctor tells you to stop. Call your doctor or health care professional for advice if you get a fever, chills or sore throat, or other symptoms of a cold or flu. Do not treat yourself. Try to avoid being around people who are sick. You may experience fever, chills and shaking during your first infusion. These effects are usually mild and can be treated with other medicines. Report any side effects during the infusion to your health care professional. Fever and chills usually do not happen with later infusions. Do not become pregnant while taking this medicine or for 7 months after stopping it. Women should inform their doctor if they wish to become pregnant or think they might be pregnant. Women of child-bearing potential will need to have a negative pregnancy test before starting this medicine. There is a potential for serious side effects to an unborn child. Talk to your health care professional or pharmacist for more information. Do not breast-feed an infant while taking this medicine or for 7 months after stopping it. Women must use effective birth control with this medicine. What side effects may I notice from receiving this medicine? Side effects that you should report to your doctor or health care professional as soon as possible: -allergic reactions like skin rash, itching or hives, swelling of the face, lips, or tongue -chest pain or  palpitations -cough -dizziness -feeling faint or lightheaded, falls -fever -general ill feeling or flu-like symptoms -signs of worsening heart failure like breathing problems; swelling in your legs and  feet -unusually weak or tired Side effects that usually do not require medical attention (report to your doctor or health care professional if they continue or are bothersome): -bone pain -changes in taste -diarrhea -joint pain -nausea/vomiting -weight loss This list may not describe all possible side effects. Call your doctor for medical advice about side effects. You may report side effects to FDA at 1-800-FDA-1088. Where should I keep my medicine? This drug is given in a hospital or clinic and will not be stored at home. NOTE: This sheet is a summary. It may not cover all possible information. If you have questions about this medicine, talk to your doctor, pharmacist, or health care provider.  2018 Elsevier/Gold Standard (2016-01-02 14:37:52)    If you develop nausea and vomiting that is not controlled by your nausea medication, call the clinic.   BELOW ARE SYMPTOMS THAT SHOULD BE REPORTED IMMEDIATELY:  *FEVER GREATER THAN 100.5 F  *CHILLS WITH OR WITHOUT FEVER  NAUSEA AND VOMITING THAT IS NOT CONTROLLED WITH YOUR NAUSEA MEDICATION  *UNUSUAL SHORTNESS OF BREATH  *UNUSUAL BRUISING OR BLEEDING  TENDERNESS IN MOUTH AND THROAT WITH OR WITHOUT PRESENCE OF ULCERS  *URINARY PROBLEMS  *BOWEL PROBLEMS  UNUSUAL RASH Items with * indicate a potential emergency and should be followed up as soon as possible.  Feel free to call the clinic you have any questions or concerns. The clinic phone number is [phone].  Please show the [facility] at check-in to the Emergency Department and triage nurse.

## 2016-10-15 NOTE — Progress Notes (Signed)
Office: [phone]  /  Fax: [phone]   HPI:   Chief Complaint: OBESITY Darlene [name] is here to discuss her progress with her obesity treatment plan. She is on the keep a food journal with 1300 to 1500 calories and 80+ grams of protein daily and is following her eating plan approximately 60 % of the time. She states she is exercising 0 minutes 0 times per week. Darlene [name] is still journaling on and off and has lost another pound (1), but she is dealing with guilt issues and is still eating emotionally. Her weight is 219 lb (99.3 kg) today and has had a weight loss of 1 pound over a period of 3 weeks since her last visit. She has lost 6 lbs since starting treatment with [name].  Diabetes II Darlene [name] has a diagnosis of diabetes type II. Darlene [name] was put on Glargine and states fasting BGs range between 116 and 140's (she didn't bring in log today) and denies any hypoglycemic episodes. She has been working on intensive lifestyle modifications including diet, exercise, and weight loss to help control her blood glucose levels.   ALLERGIES: Allergies  Allergen Reactions  . Nsaids Other (See Comments)    H/o gastric bypass - avoid NSAIDs  . Tape Hives and Rash    Adhesives in bandaids    MEDICATIONS: Current Outpatient Prescriptions on File Prior to Visit  Medication Sig Dispense Refill  . acetaminophen (TYLENOL) 325 MG tablet Take 650 mg by mouth 2 (two) times daily as needed for moderate pain or headache. Reported on 04/17/2015    . buPROPion (WELLBUTRIN SR) 150 MG 12 hr tablet Take 1 tablet (150 mg total) by mouth daily. 30 tablet 0  . calcium citrate-vitamin D (CITRACAL+D) 315-200 MG-UNIT tablet Take 1 tablet by mouth 3 (three) times daily.    . cholecalciferol (VITAMIN D) 1000 units tablet Take 1,000 Units by mouth daily.    . diphenhydrAMINE (BENADRYL) 25 MG tablet Take 25 mg by mouth daily as needed for allergies or sleep. Reported on 03/06/2015    . glucose blood (ONETOUCH VERIO) test  strip 1 each by Other route as needed for other. Reported on 03/06/2015    . hyaluronate sodium (RADIAPLEXRX) GEL Apply 1 application topically 2 (two) times daily.    . hydrochlorothiazide (HYDRODIURIL) 25 MG tablet TAKE 1 TABLET BY MOUTH EVERY DAY 30 tablet 11  . losartan (COZAAR) 100 MG tablet Take 1 tablet by mouth daily. Reported on 03/06/2015    . Melatonin 5 MG TABS Take 5 mg by mouth at bedtime. Reported on 03/06/2015    . mometasone (NASONEX) 50 MCG/ACT nasal spray Place 1-2 sprays into the nose daily as needed (for allergies.).     [name] Pediatric Multivitamins-Iron (FLINTSTONES PLUS IRON) chewable tablet Chew 1 tablet by mouth 2 (two) times daily.    . Probiotic Product ([employer] PROBIOTIC PO) Take 1 capsule by mouth daily. Reported on 03/06/2015    . Propylene Glycol (SYSTANE BALANCE OP) Apply 1-2 drops to eye 3 (three) times daily.    [name] senna-docusate (SENNA PLUS) 8.6-50 MG tablet Take 2 tablets by mouth at bedtime.    . vortioxetine HBr (TRINTELLIX) 20 MG TABS Take 20 mg by mouth at bedtime. 30 tablet 0   No current facility-administered medications on file prior to visit.     PAST MEDICAL HISTORY: Past Medical History:  Diagnosis Date  . Anemia    hx of  . Anxiety   . Arthritis    in fingers, shoulders  .  Back pain   . Balance problem   . Breast cancer ([place])   . Breast cancer of upper-outer quadrant of right female breast ([place]) 07/22/2014  . Bunion, right foot   . Clumsiness   . Constipation   . Depression   . Diabetes mellitus without complication ([place])    10+ years  . Eating disorder    binge eating  . Epistaxis 08/26/2014  . Fatty liver   . HCAP (healthcare-associated pneumonia) 12/18/2014  . Headache   . Heart murmur    never had any problems  . Hyperlipidemia   . Hypertension   . Joint pain   . Multiple food allergies   . Neuromuscular disorder ([place])    diabetic neuropathy  . Obesity   . Obstructive sleep apnea on CPAP    uses cpap nightly  . Ovarian cyst rupture     possible  . Pneumonia   . Pneumonia 11/2014  . Radiation 04/05/15-05/19/15   right chest wall 50.4 Gy, mastectomy scar 10 Gy  . Sleep apnea   . Thyroid nodule 06/2014    PAST SURGICAL HISTORY: Past Surgical History:  Procedure Laterality Date  . BIOPSY THYROID Bilateral 06/2014   2 nodules - Benign   . BREAST LUMPECTOMY WITH RADIOACTIVE SEED AND SENTINEL LYMPH NODE BIOPSY Right 12/27/2014   Procedure: BREAST LUMPECTOMY WITH RADIOACTIVE SEED AND SENTINEL LYMPH NODE BIOPSY;  Surgeon: Stark Klein, MD;  Location: [facility];  Service: General;  Laterality: Right;  . BREAST REDUCTION SURGERY Bilateral 01/06/2015   Procedure: BILATERAL BREAST REDUCTION, RIGHT ONCOPLASTIC RECONSTRUCTION),LEFT BREAST REDUCTION FOR SYMMETRY;  Surgeon: [name], MD;  Location: [facility];  Service: Plastics;  Laterality: Bilateral;  . BREATH TEK H PYLORI N/A 09/15/2012   Procedure: BREATH TEK H PYLORI;  Surgeon: Pedro Earls, MD;  Location: [facility];  Service: General;  Laterality: N/A;  . BUNIONECTOMY Left 12/2013  . CARPAL TUNNEL RELEASE Right   . CARPAL TUNNEL RELEASE Left   . DUPUYTREN CONTRACTURE RELEASE    . GASTRIC ROUX-EN-Y N/A 11/02/2012   Procedure: LAPAROSCOPIC ROUX-EN-Y GASTRIC;  Surgeon: Pedro Earls, MD;  Location: [facility];  Service: General;  Laterality: N/A;  . IR GENERIC HISTORICAL  03/18/2016   IR US GUIDE VASC ACCESS LEFT 03/18/2016 Markus Daft, MD [facility]  . IR GENERIC HISTORICAL  03/18/2016   IR FLUORO GUIDE CV LINE LEFT 03/18/2016 Markus Daft, MD [facility]  . LAPAROSCOPIC APPENDECTOMY N/A 07/22/2015   Procedure: APPENDECTOMY LAPAROSCOPIC;  Surgeon: Michael Boston, MD;  Location: [facility];  Service: General;  Laterality: N/A;  . MASTECTOMY Right 02/15/2015   modified  . MODIFIED MASTECTOMY Right 02/15/2015   Procedure: RIGHT MODIFIED RADICAL MASTECTOMY;  Surgeon: Stark Klein, MD;  Location: [facility];  Service: General;  Laterality: Right;  . PORT-A-CATH REMOVAL Left  10/10/2015   Procedure: PORT REMOVAL;  Surgeon: Stark Klein, MD;  Location: [facility];  Service: General;  Laterality: Left;  . PORTACATH PLACEMENT Left 08/02/2014   Procedure: INSERTION PORT-A-CATH;  Surgeon: Stark Klein, MD;  Location: [facility];  Service: General;  Laterality: Left;  . ROTATOR CUFF REPAIR Right 2005  . SCAR REVISION Right 12/08/2015   Procedure: COMPLEX REPAIR RIGHT CHEST 10-15CM;  Surgeon: [name], MD;  Location: [facility];  Service: Plastics;  Laterality: Right;  . TOE SURGERY  1970s   bone spur  . TRIGGER FINGER RELEASE     x3    SOCIAL HISTORY: Social History  Substance Use Topics  . Smoking status: Never Smoker  . Smokeless tobacco: Never Used  . Alcohol use 0.0 oz/week     Comment: social    FAMILY HISTORY: Family History  Problem Relation Age of Onset  . CVA Mother   . Heart disease Mother   . Sudden death Mother   . Diabetes Father   . Heart failure Father   . Hypertension Father   . Kidney disease Father   . Obesity Father   . Hypertension Sister   . Diabetes Brother   . Breast cancer Paternal Grandmother   . Diabetes Paternal Uncle     ROS: Review of Systems  Constitutional: Positive for weight loss.  Endo/Heme/Allergies:       Negative Hypoglycemia    PHYSICAL EXAM: Blood pressure 130/75, pulse 63, temperature 97.9 F (36.6 C), height 5\' 9"  (1.753 m), weight 219 lb (99.3 kg), last menstrual period 10/22/2007, SpO2 100 %. Body mass index is 32.34 kg/m. Physical Exam  Constitutional: She is oriented to person, place, and time. She appears well-developed and well-nourished.  Cardiovascular: Normal rate.   Pulmonary/Chest: Effort normal.  Musculoskeletal: Normal range of motion.  Neurological: She is oriented to person, place, and time.  Skin: Skin is warm and dry.  Psychiatric: She has a normal mood and affect. Her behavior is normal.  Vitals reviewed.   RECENT LABS AND TESTS: BMET      Component Value Date/Time   NA 139 10/01/2016 1559   K 4.1 10/01/2016 1559   CL 104 08/09/2016 1229   CO2 26 10/01/2016 1559   GLUCOSE 243 (H) 10/01/2016 1559   BUN 38.8 (H) 10/01/2016 1559   CREATININE 1.5 (H) 10/01/2016 1559   CALCIUM 9.8 10/01/2016 1559   GFRNONAA 43 (L) 08/09/2016 1229   GFRNONAA 55 (L) 02/18/2013 0827   GFRAA 50 (L) 08/09/2016 1229   GFRAA 63 02/18/2013 0827   Lab Results  Component Value Date   HGBA1C 8.1 (H) 08/09/2016   HGBA1C 6.5 (H) 01/03/2015   HGBA1C 6.9 (H) 02/18/2013   HGBA1C 7.7 (H) 11/02/2012   Lab Results  Component Value Date   INSULIN 18.3 08/09/2016   CBC    Component Value Date/Time   WBC 5.2 10/01/2016 1559   WBC 5.4 10/10/2015 1007   RBC 3.72 10/01/2016 1559   RBC 3.13 (L) 10/10/2015 1007   HGB 11.1 (L) 10/01/2016 1559   HCT 32.9 (L) 10/01/2016 1559   PLT 98 (L) 10/01/2016 1559   MCV 88.5 10/01/2016 1559   MCH 29.9 10/01/2016 1559   MCH 33.5 10/10/2015 1007   MCHC 33.8 10/01/2016 1559   MCHC 33.0 10/10/2015 1007   RDW 15.4 (H) 10/01/2016 1559   LYMPHSABS 1.5 10/01/2016 1559   MONOABS 0.4 10/01/2016 1559   EOSABS 0.2 10/01/2016 1559   EOSABS 0.1 08/09/2016 1229   BASOSABS 0.0 10/01/2016 1559   Iron/TIBC/Ferritin/ %Sat    Component Value Date/Time   IRON 58 02/18/2013 0827   TIBC 305 02/18/2013 0827   FERRITIN 133 10/31/2014 0816   IRONPCTSAT 19 (L) 02/18/2013 0827   Lipid Panel     Component Value Date/Time   CHOL 182 08/09/2016 1229   TRIG 176 (H) 08/09/2016 1229   HDL 62 08/09/2016 1229   CHOLHDL 2.9 02/18/2013 0827   VLDL 23 02/18/2013 0827   LDLCALC 85 08/09/2016 1229   Hepatic Function Panel     Component Value Date/Time   PROT 7.1 10/01/2016 1559   ALBUMIN 3.6 10/01/2016 1559  AST 41 (H) 10/01/2016 1559   ALT 46 10/01/2016 1559   ALKPHOS 112 10/01/2016 1559   BILITOT 0.71 10/01/2016 1559      Component Value Date/Time   TSH 2.170 08/09/2016 1229    ASSESSMENT AND PLAN: Type 2 diabetes  mellitus without complication, with long-term current use of insulin (HCC) - Plan: Insulin Glargine (BASAGLAR KWIKPEN) 100 UNIT/ML SOPN, metFORMIN (GLUCOPHAGE) 500 MG tablet  Class 1 obesity with serious comorbidity and body mass index (BMI) of 32.0 to 32.9 in adult, unspecified obesity type  PLAN:  Diabetes II Darlene [name] has been given extensive diabetes education by myself today including ideal fasting and post-prandial blood glucose readings, individual ideal Hgb A1c goals  and hypoglycemia prevention. We discussed the importance of good blood sugar control to decrease the likelihood of diabetic complications such as nephropathy, neuropathy, limb loss, blindness, coronary artery disease, and death. We discussed the importance of intensive lifestyle modification including diet, exercise and weight loss as the first line treatment for diabetes. Darlene [name] agrees to continue with diet and exercise to help improve her blood glucose levels. She agrees to continue Glargine, we will refill for 90 days and she agrees to continue Metformin, we will refill for 1 month and will follow up with our clinic in 3 weeks.  Cardiovascular risk counselling Dalyce was given extended (15 minutes) coronary artery disease prevention counseling today. She is 66 y.o. female and has risk factors for heart disease including obesity. We discussed intensive lifestyle modifications today with an emphasis on specific weight loss instructions and strategies. Pt was also informed of the importance of increasing exercise and decreasing saturated fats to help prevent heart disease.  Obesity Darlene [name] is currently in the action stage of change. As such, her goal is to continue with weight loss efforts She has agreed to keep a food journal with 1300 to 1500 calories and 80+ grams of protein daily Corri has been instructed to work up to a goal of 150 minutes of combined cardio and strengthening exercise per week for weight loss and  overall health benefits. We discussed the following Behavioral Modification Strategies today: no skipping meals, increasing lean protein intake, decreasing simple carbohydrates and emotional eating strategies  Darlene [name] has agreed to follow up with our clinic in 3 weeks. She was informed of the importance of frequent follow up visits to maximize her success with intensive lifestyle modifications for her multiple health conditions.  I, Doreene Nest, am acting as transcriptionist for Dennard Nip, MD  I have reviewed the above documentation for accuracy and completeness, and I agree with the above. -Dennard Nip, MD  1  OBESITY BEHAVIORAL INTERVENTION VISIT  Today's visit was # 4 out of 22.  Starting weight: 225 lbs Starting date: 08/09/16 Today's weight : 219 lbs Today's date: 10/14/2016 Total lbs lost to date: 6 (Patients must lose 7 lbs in the first 6 months to continue with counseling)   ASK: We discussed the diagnosis of obesity with [name] today and [place] agreed to give [name] permission to discuss obesity behavioral modification therapy today.  ASSESS: Tajana has the diagnosis of obesity and her BMI today is 32.33 Liam is in the action stage of change   ADVISE: Tida was educated on the multiple health risks of obesity as well as the benefit of weight loss to improve her health. She was advised of the need for long term treatment and the importance of lifestyle modifications.  AGREE: Multiple dietary modification options and treatment options were  discussed and  Crystale agreed to keep a food journal with 1300 to 1500 calories and 80+ grams of protein daily We discussed the following Behavioral Modification Strategies today: no skipping meals, increasing lean protein intake, decreasing simple carbohydrates and emotional eating strategies

## 2016-10-16 ENCOUNTER — Encounter (INDEPENDENT_AMBULATORY_CARE_PROVIDER_SITE_OTHER): Payer: Self-pay | Admitting: Family Medicine

## 2016-10-16 ENCOUNTER — Ambulatory Visit
Admission: RE | Admit: 2016-10-16 | Discharge: 2016-10-16 | Disposition: A | Discharge status: Home / Self Care | Payer: Commercial Managed Care - PPO | Source: Ambulatory Visit | Attending: Radiation Oncology | Admitting: Radiation Oncology

## 2016-10-16 DIAGNOSIS — Z510 Encounter for antineoplastic radiation therapy: Secondary | ICD-10-CM | POA: Diagnosis not present

## 2016-10-17 ENCOUNTER — Ambulatory Visit
Admission: RE | Admit: 2016-10-17 | Discharge: 2016-10-17 | Disposition: A | Discharge status: Home / Self Care | Payer: Commercial Managed Care - PPO | Source: Ambulatory Visit | Attending: Radiation Oncology | Admitting: Radiation Oncology

## 2016-10-17 DIAGNOSIS — Z510 Encounter for antineoplastic radiation therapy: Secondary | ICD-10-CM | POA: Diagnosis not present

## 2016-10-18 ENCOUNTER — Ambulatory Visit
Admission: RE | Admit: 2016-10-18 | Discharge: 2016-10-18 | Disposition: A | Discharge status: Home / Self Care | Payer: Commercial Managed Care - PPO | Source: Ambulatory Visit | Attending: Radiation Oncology | Admitting: Radiation Oncology

## 2016-10-18 DIAGNOSIS — Z510 Encounter for antineoplastic radiation therapy: Secondary | ICD-10-CM | POA: Diagnosis not present

## 2016-10-21 ENCOUNTER — Ambulatory Visit
Admission: RE | Admit: 2016-10-21 | Discharge: 2016-10-21 | Disposition: A | Discharge status: Home / Self Care | Payer: Commercial Managed Care - PPO | Source: Ambulatory Visit | Attending: Radiation Oncology | Admitting: Radiation Oncology

## 2016-10-21 DIAGNOSIS — Z510 Encounter for antineoplastic radiation therapy: Secondary | ICD-10-CM | POA: Diagnosis not present

## 2016-10-22 ENCOUNTER — Ambulatory Visit
Admission: RE | Admit: 2016-10-22 | Discharge: 2016-10-22 | Disposition: A | Discharge status: Home / Self Care | Payer: Commercial Managed Care - PPO | Source: Ambulatory Visit | Attending: Radiation Oncology | Admitting: Radiation Oncology

## 2016-10-22 DIAGNOSIS — Z510 Encounter for antineoplastic radiation therapy: Secondary | ICD-10-CM | POA: Diagnosis not present

## 2016-10-23 ENCOUNTER — Ambulatory Visit
Admission: RE | Admit: 2016-10-23 | Discharge: 2016-10-23 | Disposition: A | Discharge status: Home / Self Care | Payer: Commercial Managed Care - PPO | Source: Ambulatory Visit | Attending: Radiation Oncology | Admitting: Radiation Oncology

## 2016-10-23 ENCOUNTER — Other Ambulatory Visit (INDEPENDENT_AMBULATORY_CARE_PROVIDER_SITE_OTHER): Payer: Self-pay | Admitting: Family Medicine

## 2016-10-23 DIAGNOSIS — F3289 Other specified depressive episodes: Secondary | ICD-10-CM

## 2016-10-23 DIAGNOSIS — Z510 Encounter for antineoplastic radiation therapy: Secondary | ICD-10-CM | POA: Diagnosis not present

## 2016-10-24 ENCOUNTER — Ambulatory Visit
Admission: RE | Admit: 2016-10-24 | Discharge: 2016-10-24 | Disposition: A | Discharge status: Home / Self Care | Payer: Commercial Managed Care - PPO | Source: Ambulatory Visit | Attending: Radiation Oncology | Admitting: Radiation Oncology

## 2016-10-24 DIAGNOSIS — Z510 Encounter for antineoplastic radiation therapy: Secondary | ICD-10-CM | POA: Diagnosis not present

## 2016-10-25 ENCOUNTER — Ambulatory Visit
Admission: RE | Admit: 2016-10-25 | Discharge: 2016-10-25 | Disposition: A | Discharge status: Home / Self Care | Payer: Commercial Managed Care - PPO | Source: Ambulatory Visit | Attending: Radiation Oncology | Admitting: Radiation Oncology

## 2016-10-25 ENCOUNTER — Encounter (INDEPENDENT_AMBULATORY_CARE_PROVIDER_SITE_OTHER): Payer: Self-pay | Admitting: Family Medicine

## 2016-10-25 ENCOUNTER — Other Ambulatory Visit (INDEPENDENT_AMBULATORY_CARE_PROVIDER_SITE_OTHER): Payer: Self-pay | Admitting: Family Medicine

## 2016-10-25 DIAGNOSIS — F3289 Other specified depressive episodes: Secondary | ICD-10-CM

## 2016-10-25 DIAGNOSIS — Z510 Encounter for antineoplastic radiation therapy: Secondary | ICD-10-CM | POA: Diagnosis not present

## 2016-10-28 ENCOUNTER — Ambulatory Visit
Admission: RE | Admit: 2016-10-28 | Discharge: 2016-10-28 | Disposition: A | Discharge status: Home / Self Care | Payer: Commercial Managed Care - PPO | Source: Ambulatory Visit | Attending: Radiation Oncology | Admitting: Radiation Oncology

## 2016-10-28 DIAGNOSIS — Z510 Encounter for antineoplastic radiation therapy: Secondary | ICD-10-CM | POA: Diagnosis not present

## 2016-10-28 MED ORDER — BUPROPION HCL ER (SR) 150 MG PO TB12: 150.0000 mg | ORAL_TABLET | Freq: Every day | ORAL | 0 refills | Status: DC

## 2016-10-29 ENCOUNTER — Ambulatory Visit
Admission: RE | Admit: 2016-10-29 | Discharge: 2016-10-29 | Disposition: A | Discharge status: Home / Self Care | Payer: Commercial Managed Care - PPO | Source: Ambulatory Visit | Attending: Radiation Oncology | Admitting: Radiation Oncology

## 2016-10-29 DIAGNOSIS — Z510 Encounter for antineoplastic radiation therapy: Secondary | ICD-10-CM | POA: Diagnosis not present

## 2016-10-30 ENCOUNTER — Ambulatory Visit
Admission: RE | Admit: 2016-10-30 | Discharge: 2016-10-30 | Disposition: A | Discharge status: Home / Self Care | Payer: Commercial Managed Care - PPO | Source: Ambulatory Visit | Attending: Radiation Oncology | Admitting: Radiation Oncology

## 2016-10-30 DIAGNOSIS — Z510 Encounter for antineoplastic radiation therapy: Secondary | ICD-10-CM | POA: Diagnosis not present

## 2016-10-31 ENCOUNTER — Telehealth: Payer: Self-pay | Admitting: *Deleted

## 2016-10-31 ENCOUNTER — Other Ambulatory Visit: Payer: Self-pay | Admitting: General Surgery

## 2016-10-31 ENCOUNTER — Ambulatory Visit: Payer: Commercial Managed Care - PPO

## 2016-10-31 NOTE — Telephone Encounter (Signed)
Pt called requesting port for further chemotherapy infusions and also needs appt with Dr. Jana Hakim or Mendel Ryder with next infusion. Scheduled and confirmed at 8:30 on 10/16. Msg sent to Dr. Barry Dienes for port placement.

## 2016-11-01 ENCOUNTER — Ambulatory Visit: Payer: Commercial Managed Care - PPO

## 2016-11-04 ENCOUNTER — Telehealth: Payer: Self-pay | Admitting: *Deleted

## 2016-11-04 ENCOUNTER — Encounter: Payer: Self-pay | Admitting: Radiation Oncology

## 2016-11-04 ENCOUNTER — Ambulatory Visit: Payer: Commercial Managed Care - PPO

## 2016-11-04 ENCOUNTER — Ambulatory Visit (INDEPENDENT_AMBULATORY_CARE_PROVIDER_SITE_OTHER): Payer: Commercial Managed Care - PPO | Admitting: Family Medicine

## 2016-11-04 ENCOUNTER — Encounter (HOSPITAL_COMMUNITY): Payer: Self-pay | Admitting: *Deleted

## 2016-11-04 VITALS — BP 128/77 | HR 60 | Temp 97.9°F | Ht 69.0 in | Wt 221.0 lb

## 2016-11-04 DIAGNOSIS — Z9189 Other specified personal risk factors, not elsewhere classified: Secondary | ICD-10-CM

## 2016-11-04 DIAGNOSIS — E119 Type 2 diabetes mellitus without complications: Secondary | ICD-10-CM

## 2016-11-04 DIAGNOSIS — F3289 Other specified depressive episodes: Secondary | ICD-10-CM

## 2016-11-04 DIAGNOSIS — E669 Obesity, unspecified: Secondary | ICD-10-CM | POA: Diagnosis not present

## 2016-11-04 DIAGNOSIS — Z6832 Body mass index (BMI) 32.0-32.9, adult: Secondary | ICD-10-CM | POA: Diagnosis not present

## 2016-11-04 MED ORDER — METFORMIN HCL 500 MG PO TABS: 500.0000 mg | ORAL_TABLET | Freq: Every day | ORAL | 0 refills | Status: DC

## 2016-11-04 MED ORDER — BUPROPION HCL ER (SR) 200 MG PO TB12: 200.0000 mg | ORAL_TABLET | Freq: Every day | ORAL | 0 refills | Status: DC

## 2016-11-04 NOTE — Progress Notes (Signed)
Spoke with pt for pre-op call. Pt states she sees Dr. Aundra Dubin due to the fact she has received Herceptin as treatment for chemo and he is watching her concerning possible cardiomyopathy. She states she does not have cardiomyopathy at this point. Pt also has a hx of a heart murmur, but it has never given her any problems. Pt is a type 2 diabetic. Last A1C was 8.1 on 08/09/16. Pt states her fasting blood sugar is usually between 90-110. Instructed pt to take 1/2 of her regular dose of Basaglar Insulin this evening (will take 12 units). Instructed pt to check her blood sugar in the morning when she wakes up and every 2 hours until she leaves for the hospital. If blood sugar is 70 or below, treat with 1/2 cup of clear juice (apple or cranberry) and recheck blood sugar 15 minutes after drinking juice. If blood sugar continues to be 70 or below, call the Short Stay department and ask to speak to a nurse. She voiced understanding.

## 2016-11-04 NOTE — Progress Notes (Signed)
Office: [phone]  /  Fax: [phone]   HPI:   Chief Complaint: OBESITY Darlene [name] is here to discuss her progress with her obesity treatment plan. She is on the  keep a food journal with 1300 to 1500 calories and 80+ grams of protein  and is following her eating plan approximately 30 % of the time. She states she is exercising 0 minutes 0 times per week. Darlene [name] notes increased stress eating with recent cancer treatment and loss of job. Her weight is 221 lb (100.2 kg) today and has had a weight gain of 2 pounds over a period of 3 weeks since her last visit. She has lost 4 lbs since starting treatment with [name].  Diabetes II Darlene [name] has a diagnosis of diabetes type II. Darlene [name] states fasting BGs range between 95 and 185 and denies any hypoglycemic episodes.She increased insulin to 34 units and is getting better blood sugar control. She has been working on intensive lifestyle modifications including diet, exercise, and weight loss to help control her blood glucose levels.  At risk for cardiovascular disease Darlene [name] is at a higher than average risk for cardiovascular disease due to obesity and diabetes. She currently denies any chest pain.  Depression with emotional eating behaviors Darlene [name] is on Trintellix and Wellbutrin and feels Wellbutrin is helping but not enough. Darlene [name] is tearful in our office today. She struggles with emotional eating and using food for comfort to the extent that it is negatively impacting her health. She often snacks when she is not hungry. Darlene [name] sometimes feels she is out of control and then feels guilty that she made poor food choices. She has been working on behavior modification techniques to help reduce her emotional eating and has been somewhat successful. She shows no sign of suicidal or homicidal ideations.  Depression screen Darlene [name] 2/9 09/12/2016 08/09/2016 01/01/2016 11/15/2015 10/12/2015  Decreased Interest 0 1 0 0 0  Down, Depressed, Hopeless 0 1 0 0  0  PHQ - 2 Score 0 2 0 0 0  Altered sleeping - 0 - - -  Tired, decreased energy - 1 - - -  Change in appetite - 2 - - -  Feeling bad or failure about yourself  - 2 - - -  Trouble concentrating - 1 - - -  Moving slowly or fidgety/restless - 1 - - -  Suicidal thoughts - 0 - - -  PHQ-9 Score - 9 - - -  Some recent data might be hidden      ALLERGIES: Allergies  Allergen Reactions  . Nsaids Other (See Comments)    H/o gastric bypass - avoid NSAIDs  . Tape Hives and Rash    Adhesives in bandaids    MEDICATIONS: Current Outpatient Prescriptions on File Prior to Visit  Medication Sig Dispense Refill  . acetaminophen (TYLENOL) 325 MG tablet Take 650 mg by mouth 2 (two) times daily as needed for moderate pain or headache. Reported on 04/17/2015    . calcium citrate-vitamin D (CITRACAL+D) 315-200 MG-UNIT tablet Take 1 tablet by mouth 3 (three) times daily.    . cholecalciferol (VITAMIN D) 1000 units tablet Take 1,000 Units by mouth daily.    . diphenhydrAMINE (BENADRYL) 25 MG tablet Take 25 mg by mouth daily as needed for allergies or sleep. Reported on 03/06/2015    . glucose blood (ONETOUCH VERIO) test strip 1 each by Other route as needed for other. Reported on 03/06/2015    . hyaluronate sodium (RADIAPLEXRX) GEL Apply 1 application topically  2 (two) times daily.    . hydrochlorothiazide (HYDRODIURIL) 25 MG tablet TAKE 1 TABLET BY MOUTH EVERY DAY 30 tablet 11  . Insulin Glargine (BASAGLAR KWIKPEN) 100 UNIT/ML SOPN Inject 0.25 mLs (25 Units total) into the skin at bedtime. 15 pen 0  . losartan (COZAAR) 100 MG tablet Take 1 tablet by mouth daily. Reported on 03/06/2015    . Melatonin 5 MG TABS Take 5 mg by mouth at bedtime. Reported on 03/06/2015    . metFORMIN (GLUCOPHAGE) 500 MG tablet Take 1 tablet (500 mg total) by mouth daily. 30 tablet 0  . mometasone (NASONEX) 50 MCG/ACT nasal spray Place 1-2 sprays into the nose daily as needed (for allergies.).     [name] Pediatric Multivitamins-Iron  (FLINTSTONES PLUS IRON) chewable tablet Chew 1 tablet by mouth 2 (two) times daily.    . Probiotic Product ([employer] PROBIOTIC PO) Take 1 capsule by mouth daily. Reported on 03/06/2015    . Propylene Glycol (SYSTANE BALANCE OP) Apply 1-2 drops to eye 3 (three) times daily.    [name] senna-docusate (SENNA PLUS) 8.6-50 MG tablet Take 2 tablets by mouth at bedtime.    . vortioxetine HBr (TRINTELLIX) 20 MG TABS Take 20 mg by mouth at bedtime. 30 tablet 0   No current facility-administered medications on file prior to visit.     PAST MEDICAL HISTORY: Past Medical History:  Diagnosis Date  . Anemia    hx of - during 1st round chemo  . Anxiety   . Arthritis    in fingers, shoulders  . Back pain   . Balance problem   . Breast cancer ([place])   . Breast cancer of upper-outer quadrant of right female breast ([place]) 07/22/2014  . Bunion, right foot   . Clumsiness   . Constipation   . Depression   . Diabetes mellitus without complication ([place])    06+ years  . Eating disorder    binge eating  . Epistaxis 08/26/2014  . Fatty liver   . HCAP (healthcare-associated pneumonia) 12/18/2014  . Headache   . Heart murmur    never had any problems  . Hyperlipidemia   . Hypertension   . Joint pain   . Multiple food allergies   . Neuromuscular disorder ([place])    diabetic neuropathy  . Obesity   . Obstructive sleep apnea on CPAP    uses cpap nightly  . Ovarian cyst rupture    possible  . Pneumonia   . Pneumonia 11/2014  . Radiation 04/05/15-05/19/15   right chest wall 50.4 Gy, mastectomy scar 10 Gy  . Sleep apnea   . Thyroid nodule 06/2014    PAST SURGICAL HISTORY: Past Surgical History:  Procedure Laterality Date  . APPENDECTOMY    . BIOPSY THYROID Bilateral 06/2014   2 nodules - Benign   . BREAST LUMPECTOMY WITH RADIOACTIVE SEED AND SENTINEL LYMPH NODE BIOPSY Right 12/27/2014   Procedure: BREAST LUMPECTOMY WITH RADIOACTIVE SEED AND SENTINEL LYMPH NODE BIOPSY;  Surgeon: Stark Klein, Darlene [name];  Location: [facility];  Service: General;  Laterality: Right;  . BREAST REDUCTION SURGERY Bilateral 01/06/2015   Procedure: BILATERAL BREAST REDUCTION, RIGHT ONCOPLASTIC RECONSTRUCTION),LEFT BREAST REDUCTION FOR SYMMETRY;  Surgeon: [name], Darlene [name];  Location: [facility];  Service: Plastics;  Laterality: Bilateral;  . BREATH TEK H PYLORI N/A 09/15/2012   Procedure: BREATH TEK H PYLORI;  Surgeon: Pedro Earls, Darlene [name];  Location: [facility];  Service: General;  Laterality: N/A;  . BUNIONECTOMY Left 12/2013  .  CARPAL TUNNEL RELEASE Right   . CARPAL TUNNEL RELEASE Left   . COLONOSCOPY    . DUPUYTREN CONTRACTURE RELEASE    . GASTRIC ROUX-EN-Y N/A 11/02/2012   Procedure: LAPAROSCOPIC ROUX-EN-Y GASTRIC;  Surgeon: Pedro Earls, Darlene [name];  Location: [facility];  Service: General;  Laterality: N/A;  . IR GENERIC HISTORICAL  03/18/2016   IR US GUIDE VASC ACCESS LEFT 03/18/2016 Markus Daft, Darlene [name]  . IR GENERIC HISTORICAL  03/18/2016   IR FLUORO GUIDE CV LINE LEFT 03/18/2016 Markus Daft, Darlene [name]  . LAPAROSCOPIC APPENDECTOMY N/A 07/22/2015   Procedure: APPENDECTOMY LAPAROSCOPIC;  Surgeon: Michael Boston, Darlene [name];  Location: [facility];  Service: General;  Laterality: N/A;  . MASTECTOMY Right 02/15/2015   modified  . MODIFIED MASTECTOMY Right 02/15/2015   Procedure: RIGHT MODIFIED RADICAL MASTECTOMY;  Surgeon: Stark Klein, Darlene [name];  Location: [facility] [place];  Service: General;  Laterality: Right;  . PORT-A-CATH REMOVAL Left 10/10/2015   Procedure: PORT REMOVAL;  Surgeon: Stark Klein, Darlene [name];  Location: [facility];  Service: General;  Laterality: Left;  . PORTACATH PLACEMENT Left 08/02/2014   Procedure: INSERTION PORT-A-CATH;  Surgeon: Stark Klein, Darlene [name];  Location: [place] [facility];  Service: General;  Laterality: Left;  . ROTATOR CUFF REPAIR Right 2005  . SCAR REVISION Right 12/08/2015   Procedure: COMPLEX REPAIR RIGHT CHEST 10-15CM;  Surgeon: [name], Darlene [name];  Location: [facility];  Service: Plastics;   Laterality: Right;  . TOE SURGERY  1970s   bone spur  . TRIGGER FINGER RELEASE     x3    SOCIAL HISTORY: Social History  Substance Use Topics  . Smoking status: Never Smoker  . Smokeless tobacco: Never Used  . Alcohol use 0.0 oz/week     Comment: social    FAMILY HISTORY: Family History  Problem Relation Age of Onset  . CVA Mother   . Heart disease Mother   . Sudden death Mother   . Diabetes Father   . Heart failure Father   . Hypertension Father   . Kidney disease Father   . Obesity Father   . Hypertension Sister   . Diabetes Brother   . Breast cancer Paternal Grandmother   . Diabetes Paternal Uncle     ROS: Review of Systems  Constitutional: Negative for weight loss.  Cardiovascular: Negative for chest pain.  Endo/Heme/Allergies:       Negative hypoglcyemia  Psychiatric/Behavioral: Positive for depression. Negative for suicidal ideas.    PHYSICAL EXAM: Blood pressure 128/77, pulse 60, temperature 97.9 F (36.6 C), height 5\' 9"  (1.753 m), weight 221 lb (100.2 kg), last menstrual period 10/22/2007, SpO2 97 %. Body mass index is 32.64 kg/m. Physical Exam  Constitutional: She is oriented to person, place, and time. She appears well-developed and well-nourished.  Cardiovascular: Normal rate.   Pulmonary/Chest: Effort normal.  Musculoskeletal: Normal range of motion.  Neurological: She is oriented to person, place, and time.  Skin: Skin is warm and dry.  Psychiatric: She has a normal mood and affect. Her behavior is normal.  Vitals reviewed.   RECENT LABS AND TESTS: BMET    Component Value Date/Time   NA 141 10/15/2016 0914   K 4.2 10/15/2016 0914   CL 104 08/09/2016 1229   CO2 24 10/15/2016 0914   GLUCOSE 189 (H) 10/15/2016 0914   BUN 37.6 (H) 10/15/2016 0914   CREATININE 1.4 (H) 10/15/2016 0914   CALCIUM 9.6 10/15/2016 0914   GFRNONAA 43 (L) 08/09/2016 1229   GFRNONAA 55 (  L) 02/18/2013 0827   GFRAA 50 (L) 08/09/2016 1229   GFRAA 63 02/18/2013  0827   Lab Results  Component Value Date   HGBA1C 8.1 (H) 08/09/2016   HGBA1C 6.5 (H) 01/03/2015   HGBA1C 6.9 (H) 02/18/2013   HGBA1C 7.7 (H) 11/02/2012   Lab Results  Component Value Date   INSULIN 18.3 08/09/2016   CBC    Component Value Date/Time   WBC 4.6 10/15/2016 0914   WBC 5.4 10/10/2015 1007   RBC 3.77 10/15/2016 0914   RBC 3.13 (L) 10/10/2015 1007   HGB 11.3 (L) 10/15/2016 0914   HCT 33.3 (L) 10/15/2016 0914   PLT 93 (L) 10/15/2016 0914   MCV 88.3 10/15/2016 0914   MCH 30.0 10/15/2016 0914   MCH 33.5 10/10/2015 1007   MCHC 34.0 10/15/2016 0914   MCHC 33.0 10/10/2015 1007   RDW 15.7 (H) 10/15/2016 0914   LYMPHSABS 1.0 10/15/2016 0914   MONOABS 0.3 10/15/2016 0914   EOSABS 0.1 10/15/2016 0914   EOSABS 0.1 08/09/2016 1229   BASOSABS 0.0 10/15/2016 0914   Iron/TIBC/Ferritin/ %Sat    Component Value Date/Time   IRON 58 02/18/2013 0827   TIBC 305 02/18/2013 0827   FERRITIN 133 10/31/2014 0816   IRONPCTSAT 19 (L) 02/18/2013 0827   Lipid Panel     Component Value Date/Time   CHOL 182 08/09/2016 1229   TRIG 176 (H) 08/09/2016 1229   HDL 62 08/09/2016 1229   CHOLHDL 2.9 02/18/2013 0827   VLDL 23 02/18/2013 0827   LDLCALC 85 08/09/2016 1229   Hepatic Function Panel     Component Value Date/Time   PROT 6.9 10/15/2016 0914   ALBUMIN 3.5 10/15/2016 0914   AST 39 (H) 10/15/2016 0914   ALT 45 10/15/2016 0914   ALKPHOS 112 10/15/2016 0914   BILITOT 0.74 10/15/2016 0914      Component Value Date/Time   TSH 2.170 08/09/2016 1229    ASSESSMENT AND PLAN: Type 2 diabetes mellitus without complication, without long-term current use of insulin (Darlene [name]) - Plan: metFORMIN (GLUCOPHAGE) 500 MG tablet  Other depression - with emotional eating - Plan: buPROPion (WELLBUTRIN SR) 200 MG 12 hr tablet  At risk for heart disease  Class 1 obesity with serious comorbidity and body mass index (BMI) of 32.0 to 32.9 in adult, unspecified obesity type  PLAN:  Diabetes  II Darlene [name] has been given extensive diabetes education by myself today including ideal fasting and post-prandial blood glucose readings, individual ideal Hgb A1c goals  and hypoglycemia prevention. We discussed the importance of good blood sugar control to decrease the likelihood of diabetic complications such as nephropathy, neuropathy, limb loss, blindness, coronary artery disease, and death. We discussed the importance of intensive lifestyle modification including diet, exercise and weight loss as the first line treatment for diabetes. She agrees to continue to work on diet, exercise and weight loss. Darlene [name] agrees to continue Metformin 500 mg qd #30 with no refills and will follow up at the agreed upon time.  Cardiovascular risk counselling Darlene [name] was given extended (15 minutes) coronary artery disease prevention counseling today. She is 66 y.o. female and has risk factors for heart disease including obesity and diabetes. We discussed intensive lifestyle modifications today with an emphasis on specific weight loss instructions and strategies. Pt was also informed of the importance of increasing exercise and decreasing saturated fats to help prevent heart disease.  Depression with Emotional Eating Behaviors We discussed behavior modification techniques today to help Darlene [name] deal with her  emotional eating and depression. She has agreed to increase Wellbutrin SR to 200 mg qam #30 with no refills and will follow up as directed.  Obesity Aaria is currently in the action stage of change. As such, her goal is to continue with weight loss efforts She has agreed to keep a food journal with 1500 calories and 80 grams of protein daily Mckinna has been instructed to work up to a goal of 150 minutes of combined cardio and strengthening exercise per week for weight loss and overall health benefits. We discussed the following Behavioral Modification Strategies today: increasing lean protein intake,  decreasing simple carbohydrates  and work on meal planning and easy cooking plans  Dameshia has agreed to follow up with our clinic in 2 weeks. She was informed of the importance of frequent follow up visits to maximize her success with intensive lifestyle modifications for her multiple health conditions.  I, Darlene [name], am acting as transcriptionist for Darlene Nip, Darlene [name]  I have reviewed the above documentation for accuracy and completeness, and I agree with the above. -Darlene Nip, Darlene [name]    OBESITY BEHAVIORAL INTERVENTION VISIT  Today's visit was # 5 out of 22.  Starting weight: 225 lbs Starting date: 08/09/16 Today's weight : 221 lbs Today's date: 11/04/2016 Total lbs lost to date: 4 (Patients must lose 7 lbs in the first 6 months to continue with counseling)   ASK: We discussed the diagnosis of obesity with [name] today and Berry agreed to give [name] permission to discuss obesity behavioral modification therapy today.  ASSESS: Marcelene has the diagnosis of obesity and her BMI today is 32.62 Lauriana is in the action stage of change   ADVISE: Jaquelyn was educated on the multiple health risks of obesity as well as the benefit of weight loss to improve her health. She was advised of the need for long term treatment and the importance of lifestyle modifications.  AGREE: Multiple dietary modification options and treatment options were discussed and  Marai agreed to keep a food journal with 1500 calories and 80 grams of protein daily We discussed the following Behavioral Modification Strategies today: increasing lean protein intake, decreasing simple carbohydrates  and work on meal planning and easy cooking plans

## 2016-11-04 NOTE — Progress Notes (Signed)
  Radiation Oncology         ([phone]) [phone] ________________________________  Name: Darlene [name] MRN: [id number]  Date: 11/04/2016  DOB: 11-Jun-1950  End of Treatment Note  Diagnosis:   ypT2 ypN0, stage IIB invasive ductal carcinoma, grade 2 of the right breast, now recurrent     Indication for treatment:  Curative       Radiation treatment dates:   09/26/16-10/30/16  Site/dose:   1.  Right Axillary nodal area of persistence on PET scan, 45 Gy in 25 fractions    Beams/energy:   1. 3D, 6X  Narrative: The patient tolerated radiation treatment relatively well.  She began her treatments feeling well and was otherwise asymptomatic. She continued to use radiaplex as prescribed throughout the duration of her treatments and developed very little radiation-related skin changes.   Plan: The patient has completed radiation treatment. The patient will return to radiation oncology clinic for routine followup in one month. I advised them to call or return sooner if they have any questions or concerns related to their recovery or treatment.  -----------------------------------  Blair Promise, PhD, MD  This document serves as a record of services personally performed by Gery Pray, MD. It was created on his behalf by Reola Mosher, a trained medical scribe. The creation of this record is based on the scribe's personal observations and the provider's statements to them. This document has been checked and approved by the attending provider.

## 2016-11-04 NOTE — Telephone Encounter (Signed)
Called pt and informed lab, flush and Mendel Ryder appt have been moved to 10/17 and that an order has been placed for herceptin to be moved after appt with Mendel Ryder.

## 2016-11-05 ENCOUNTER — Ambulatory Visit (HOSPITAL_COMMUNITY): Payer: Commercial Managed Care - PPO | Admitting: Certified Registered Nurse Anesthetist

## 2016-11-05 ENCOUNTER — Ambulatory Visit (HOSPITAL_COMMUNITY)
Admission: RE | Admit: 2016-11-05 | Discharge: 2016-11-05 | Disposition: A | Discharge status: Home / Self Care | Payer: Commercial Managed Care - PPO | Source: Ambulatory Visit | Attending: General Surgery | Admitting: General Surgery

## 2016-11-05 ENCOUNTER — Other Ambulatory Visit: Payer: Commercial Managed Care - PPO

## 2016-11-05 ENCOUNTER — Ambulatory Visit: Payer: Commercial Managed Care - PPO

## 2016-11-05 ENCOUNTER — Ambulatory Visit: Payer: Commercial Managed Care - PPO | Admitting: Adult Health

## 2016-11-05 ENCOUNTER — Encounter (HOSPITAL_COMMUNITY): Payer: Self-pay | Admitting: Urology

## 2016-11-05 ENCOUNTER — Ambulatory Visit (HOSPITAL_COMMUNITY): Payer: Commercial Managed Care - PPO

## 2016-11-05 ENCOUNTER — Encounter (HOSPITAL_COMMUNITY)
Admission: RE | Disposition: A | Discharge status: Home / Self Care | Payer: Self-pay | Source: Ambulatory Visit | Attending: General Surgery

## 2016-11-05 DIAGNOSIS — M1990 Unspecified osteoarthritis, unspecified site: Secondary | ICD-10-CM | POA: Diagnosis not present

## 2016-11-05 DIAGNOSIS — F419 Anxiety disorder, unspecified: Secondary | ICD-10-CM | POA: Insufficient documentation

## 2016-11-05 DIAGNOSIS — C50911 Malignant neoplasm of unspecified site of right female breast: Secondary | ICD-10-CM | POA: Diagnosis not present

## 2016-11-05 DIAGNOSIS — F329 Major depressive disorder, single episode, unspecified: Secondary | ICD-10-CM | POA: Diagnosis not present

## 2016-11-05 DIAGNOSIS — E1140 Type 2 diabetes mellitus with diabetic neuropathy, unspecified: Secondary | ICD-10-CM | POA: Insufficient documentation

## 2016-11-05 DIAGNOSIS — E785 Hyperlipidemia, unspecified: Secondary | ICD-10-CM | POA: Insufficient documentation

## 2016-11-05 DIAGNOSIS — D649 Anemia, unspecified: Secondary | ICD-10-CM | POA: Diagnosis not present

## 2016-11-05 DIAGNOSIS — G4733 Obstructive sleep apnea (adult) (pediatric): Secondary | ICD-10-CM | POA: Diagnosis not present

## 2016-11-05 DIAGNOSIS — E669 Obesity, unspecified: Secondary | ICD-10-CM | POA: Diagnosis not present

## 2016-11-05 DIAGNOSIS — I10 Essential (primary) hypertension: Secondary | ICD-10-CM | POA: Insufficient documentation

## 2016-11-05 DIAGNOSIS — Z794 Long term (current) use of insulin: Secondary | ICD-10-CM | POA: Diagnosis not present

## 2016-11-05 DIAGNOSIS — Z79899 Other long term (current) drug therapy: Secondary | ICD-10-CM | POA: Insufficient documentation

## 2016-11-05 DIAGNOSIS — Z452 Encounter for adjustment and management of vascular access device: Secondary | ICD-10-CM

## 2016-11-05 DIAGNOSIS — Z419 Encounter for procedure for purposes other than remedying health state, unspecified: Secondary | ICD-10-CM

## 2016-11-05 HISTORY — PX: PORTACATH PLACEMENT: SHX2246

## 2016-11-05 LAB — BASIC METABOLIC PANEL
Anion gap: 9 (ref 5–15)
BUN: 39 mg/dL — ABNORMAL HIGH (ref 6–20)
CO2: 22 mmol/L (ref 22–32)
Calcium: 9.3 mg/dL (ref 8.9–10.3)
Chloride: 107 mmol/L (ref 101–111)
Creatinine, Ser: 1.34 mg/dL — ABNORMAL HIGH (ref 0.44–1.00)
GFR calc Af Amer: 47 mL/min — ABNORMAL LOW (ref >60)
GFR calc non Af Amer: 41 mL/min — ABNORMAL LOW (ref >60)
Glucose, Bld: 147 mg/dL — ABNORMAL HIGH (ref 65–99)
Potassium: 3.9 mmol/L (ref 3.5–5.1)
Sodium: 138 mmol/L (ref 135–145)

## 2016-11-05 LAB — CBC
HCT: 34.4 % — ABNORMAL LOW (ref 36.0–46.0)
Hemoglobin: 11.4 g/dL — ABNORMAL LOW (ref 12.0–15.0)
MCH: 29.5 pg (ref 26.0–34.0)
MCHC: 33.1 g/dL (ref 30.0–36.0)
MCV: 88.9 fL (ref 78.0–100.0)
Platelets: 108 10*3/uL — ABNORMAL LOW (ref 150–400)
RBC: 3.87 MIL/uL (ref 3.87–5.11)
RDW: 14.7 % (ref 11.5–15.5)
WBC: 4.7 10*3/uL (ref 4.0–10.5)

## 2016-11-05 LAB — HEMOGLOBIN A1C
Hgb A1c MFr Bld: 8.2 % — ABNORMAL HIGH (ref 4.8–5.6)
Mean Plasma Glucose: 188.64 mg/dL

## 2016-11-05 LAB — GLUCOSE, CAPILLARY
Glucose-Capillary: 152 mg/dL — ABNORMAL HIGH (ref 65–99)
Glucose-Capillary: 136 mg/dL — ABNORMAL HIGH (ref 65–99)

## 2016-11-05 SURGERY — INSERTION, TUNNELED CENTRAL VENOUS DEVICE, WITH PORT
Anesthesia: General

## 2016-11-05 MED ORDER — MIDAZOLAM HCL 2 MG/2ML IJ SOLN
INTRAMUSCULAR | Status: AC
  Filled 2016-11-05: qty 2

## 2016-11-05 MED ORDER — FENTANYL CITRATE (PF) 100 MCG/2ML IJ SOLN: 25.0000 ug | INTRAMUSCULAR | Status: DC | PRN

## 2016-11-05 MED ORDER — PROMETHAZINE HCL 25 MG/ML IJ SOLN: 6.2500 mg | INTRAMUSCULAR | Status: DC | PRN

## 2016-11-05 MED ORDER — ONDANSETRON HCL 4 MG/2ML IJ SOLN
INTRAMUSCULAR | Status: DC | PRN
  Administered 2016-11-05: 4 mg via INTRAVENOUS

## 2016-11-05 MED ORDER — BUPIVACAINE-EPINEPHRINE (PF) 0.25% -1:200000 IJ SOLN
INTRAMUSCULAR | Status: AC
  Filled 2016-11-05: qty 30

## 2016-11-05 MED ORDER — OXYCODONE HCL 5 MG PO TABS: 5.0000 mg | ORAL_TABLET | ORAL | Status: DC | PRN

## 2016-11-05 MED ORDER — LIDOCAINE HCL 1 % IJ SOLN
INTRAMUSCULAR | Status: DC | PRN
  Administered 2016-11-05: 14 mL

## 2016-11-05 MED ORDER — ACETAMINOPHEN 325 MG PO TABS: 650.0000 mg | ORAL_TABLET | ORAL | Status: DC | PRN

## 2016-11-05 MED ORDER — CEFAZOLIN SODIUM-DEXTROSE 2-4 GM/100ML-% IV SOLN
INTRAVENOUS | Status: AC
  Filled 2016-11-05: qty 100

## 2016-11-05 MED ORDER — ACETAMINOPHEN 650 MG RE SUPP: 650.0000 mg | RECTAL | Status: DC | PRN

## 2016-11-05 MED ORDER — OXYCODONE HCL 5 MG PO TABS: 5.0000 mg | ORAL_TABLET | Freq: Four times a day (QID) | ORAL | 0 refills | Status: DC | PRN

## 2016-11-05 MED ORDER — SODIUM CHLORIDE 0.9 % IV SOLN: 250.0000 mL | INTRAVENOUS | Status: DC | PRN

## 2016-11-05 MED ORDER — SODIUM CHLORIDE 0.9 % IV SOLN
INTRAVENOUS | Status: DC | PRN
  Administered 2016-11-05: 10:00:00

## 2016-11-05 MED ORDER — CHLORHEXIDINE GLUCONATE CLOTH 2 % EX PADS: 6.0000 | MEDICATED_PAD | Freq: Once | CUTANEOUS | Status: DC

## 2016-11-05 MED ORDER — MIDAZOLAM HCL 2 MG/2ML IJ SOLN
INTRAMUSCULAR | Status: DC | PRN
  Administered 2016-11-05: 2 mg via INTRAVENOUS

## 2016-11-05 MED ORDER — HEPARIN SOD (PORK) LOCK FLUSH 100 UNIT/ML IV SOLN
INTRAVENOUS | Status: DC | PRN
  Administered 2016-11-05: 500 [IU] via INTRAVENOUS

## 2016-11-05 MED ORDER — 0.9 % SODIUM CHLORIDE (POUR BTL) OPTIME
TOPICAL | Status: DC | PRN
  Administered 2016-11-05: 1000 mL

## 2016-11-05 MED ORDER — LIDOCAINE HCL (PF) 1 % IJ SOLN
INTRAMUSCULAR | Status: AC
  Filled 2016-11-05: qty 30

## 2016-11-05 MED ORDER — PROPOFOL 10 MG/ML IV BOLUS
INTRAVENOUS | Status: DC | PRN
  Administered 2016-11-05: 200 mg via INTRAVENOUS

## 2016-11-05 MED ORDER — LACTATED RINGERS IV SOLN
INTRAVENOUS | Status: DC | PRN
  Administered 2016-11-05: 10:00:00 via INTRAVENOUS

## 2016-11-05 MED ORDER — FENTANYL CITRATE (PF) 250 MCG/5ML IJ SOLN
INTRAMUSCULAR | Status: DC | PRN
  Administered 2016-11-05: 25 ug via INTRAVENOUS

## 2016-11-05 MED ORDER — LIDOCAINE 2% (20 MG/ML) 5 ML SYRINGE
INTRAMUSCULAR | Status: DC | PRN
  Administered 2016-11-05: 60 mg via INTRAVENOUS

## 2016-11-05 MED ORDER — CEFAZOLIN SODIUM-DEXTROSE 2-4 GM/100ML-% IV SOLN
2.0000 g | INTRAVENOUS | Status: AC
  Administered 2016-11-05: 2 g via INTRAVENOUS

## 2016-11-05 MED ORDER — FENTANYL CITRATE (PF) 250 MCG/5ML IJ SOLN
INTRAMUSCULAR | Status: AC
  Filled 2016-11-05: qty 5

## 2016-11-05 MED ORDER — SODIUM CHLORIDE 0.9% FLUSH: 3.0000 mL | INTRAVENOUS | Status: DC | PRN

## 2016-11-05 MED ORDER — HEPARIN SOD (PORK) LOCK FLUSH 100 UNIT/ML IV SOLN
INTRAVENOUS | Status: AC
  Filled 2016-11-05: qty 5

## 2016-11-05 MED ORDER — SODIUM CHLORIDE 0.9% FLUSH: 3.0000 mL | Freq: Two times a day (BID) | INTRAVENOUS | Status: DC

## 2016-11-05 MED ORDER — LACTATED RINGERS IV SOLN
INTRAVENOUS | Status: DC
  Administered 2016-11-05: 08:00:00 via INTRAVENOUS

## 2016-11-05 MED ORDER — PROPOFOL 10 MG/ML IV BOLUS
INTRAVENOUS | Status: AC
  Filled 2016-11-05: qty 20

## 2016-11-05 SURGICAL SUPPLY — 49 items
BAG DECANTER FOR FLEXI CONT (MISCELLANEOUS) ×2 IMPLANT
BLADE HEX COATED 2.75 (ELECTRODE) ×2 IMPLANT
BLADE SURG 11 STRL SS (BLADE) ×2 IMPLANT
BLADE SURG 15 STRL LF DISP TIS (BLADE) ×1 IMPLANT
BLADE SURG 15 STRL SS (BLADE) ×1
CANISTER SUCT 3000ML PPV (MISCELLANEOUS) IMPLANT
CHLORAPREP W/TINT 10.5 ML (MISCELLANEOUS) ×2 IMPLANT
COVER SURGICAL LIGHT HANDLE (MISCELLANEOUS) ×2 IMPLANT
COVER TRANSDUCER ULTRASND GEL (DRAPE) IMPLANT
CRADLE DONUT ADULT HEAD (MISCELLANEOUS) ×2 IMPLANT
DECANTER SPIKE VIAL GLASS SM (MISCELLANEOUS) ×4 IMPLANT
DERMABOND ADVANCED (GAUZE/BANDAGES/DRESSINGS) ×1
DERMABOND ADVANCED .7 DNX12 (GAUZE/BANDAGES/DRESSINGS) ×1 IMPLANT
DRAPE C-ARM 42X72 X-RAY (DRAPES) ×2 IMPLANT
DRAPE CHEST BREAST 15X10 FENES (DRAPES) ×2 IMPLANT
DRAPE INCISE IOBAN 66X45 STRL (DRAPES) ×2 IMPLANT
DRAPE UTILITY XL STRL (DRAPES) ×4 IMPLANT
DRAPE WARM FLUID 44X44 (DRAPE) IMPLANT
DRSG TEGADERM 4X4.75 (GAUZE/BANDAGES/DRESSINGS) ×4 IMPLANT
ELECT REM PT RETURN 9FT ADLT (ELECTROSURGICAL) ×2
ELECTRODE REM PT RTRN 9FT ADLT (ELECTROSURGICAL) ×1 IMPLANT
GAUZE SPONGE 4X4 12PLY STRL (GAUZE/BANDAGES/DRESSINGS) ×2 IMPLANT
GAUZE SPONGE 4X4 16PLY XRAY LF (GAUZE/BANDAGES/DRESSINGS) ×2 IMPLANT
GEL ULTRASOUND 20GR AQUASONIC (MISCELLANEOUS) IMPLANT
GLOVE BIO SURGEON STRL SZ 6 (GLOVE) ×2 IMPLANT
GLOVE BIOGEL PI IND STRL 6.5 (GLOVE) ×1 IMPLANT
GLOVE BIOGEL PI INDICATOR 6.5 (GLOVE) ×1
GOWN STRL REUS W/ TWL LRG LVL3 (GOWN DISPOSABLE) ×1 IMPLANT
GOWN STRL REUS W/TWL 2XL LVL3 (GOWN DISPOSABLE) ×2 IMPLANT
GOWN STRL REUS W/TWL LRG LVL3 (GOWN DISPOSABLE) ×1
KIT BASIN OR (CUSTOM PROCEDURE TRAY) ×2 IMPLANT
KIT PORT POWER 8FR ISP CVUE (Miscellaneous) ×2 IMPLANT
KIT ROOM TURNOVER OR (KITS) ×2 IMPLANT
NEEDLE HYPO 25GX1X1/2 BEV (NEEDLE) ×2 IMPLANT
NS IRRIG 1000ML POUR BTL (IV SOLUTION) ×2 IMPLANT
PACK SURGICAL SETUP 50X90 (CUSTOM PROCEDURE TRAY) ×2 IMPLANT
PAD ARMBOARD 7.5X6 YLW CONV (MISCELLANEOUS) ×2 IMPLANT
PENCIL BUTTON HOLSTER BLD 10FT (ELECTRODE) ×2 IMPLANT
SUT MON AB 4-0 PC3 18 (SUTURE) ×2 IMPLANT
SUT PROLENE 2 0 SH DA (SUTURE) ×4 IMPLANT
SUT VIC AB 3-0 SH 27 (SUTURE) ×1
SUT VIC AB 3-0 SH 27X BRD (SUTURE) ×1 IMPLANT
SYR 20ML ECCENTRIC (SYRINGE) ×4 IMPLANT
SYR 5ML LUER SLIP (SYRINGE) ×2 IMPLANT
SYR CONTROL 10ML LL (SYRINGE) ×2 IMPLANT
TOWEL OR 17X24 6PK STRL BLUE (TOWEL DISPOSABLE) ×2 IMPLANT
TOWEL OR 17X26 10 PK STRL BLUE (TOWEL DISPOSABLE) ×2 IMPLANT
TUBE CONNECTING 12X1/4 (SUCTIONS) IMPLANT
YANKAUER SUCT BULB TIP NO VENT (SUCTIONS) IMPLANT

## 2016-11-05 NOTE — Anesthesia Preprocedure Evaluation (Addendum)
Anesthesia Evaluation  Patient identified by MRN, date of birth, ID band Patient awake    Reviewed: Allergy & Precautions, NPO status , Patient's Chart, lab work & pertinent test results  Airway Mallampati: II  TM Distance: >3 FB Neck ROM: Full    Dental  (+) Dental Advisory Given   Pulmonary sleep apnea ,    breath sounds clear to auscultation       Cardiovascular hypertension, Pt. on medications  Rhythm:Regular Rate:Normal  09/2016: Normal LV size with mild LV hypertrophy. EF 55-60%. Strain as  above. Moderate diastolic dysfunction. Normal RV size and  systolic function. No significant valvular abnormalities.    Neuro/Psych  Neuromuscular disease    GI/Hepatic negative GI ROS, Neg liver ROS,   Endo/Other  diabetes, Type 2, Oral Hypoglycemic Agents, Insulin Dependent  Renal/GU Renal InsufficiencyRenal disease     Musculoskeletal  (+) Arthritis ,   Abdominal   Peds  Hematology  (+) anemia ,   Anesthesia Other Findings   Reproductive/Obstetrics                            Lab Results  Component Value Date   WBC 4.7 11/05/2016   HGB 11.4 (L) 11/05/2016   HCT 34.4 (L) 11/05/2016   MCV 88.9 11/05/2016   PLT PENDING 11/05/2016   Lab Results  Component Value Date   CREATININE 1.34 (H) 11/05/2016   BUN 39 (H) 11/05/2016   NA 138 11/05/2016   K 3.9 11/05/2016   CL 107 11/05/2016   CO2 22 11/05/2016    Anesthesia Physical Anesthesia Plan  ASA: II  Anesthesia Plan: General   Post-op Pain Management:    Induction: Intravenous  PONV Risk Score and Plan: 3 and Ondansetron, Dexamethasone and Treatment may vary due to age or medical condition  Airway Management Planned: LMA  Additional Equipment:   Intra-op Plan:   Post-operative Plan: Extubation in OR  Informed Consent: I have reviewed the patients History and Physical, chart, labs and discussed the procedure including the  risks, benefits and alternatives for the proposed anesthesia with the patient or authorized representative who has indicated his/her understanding and acceptance.   Dental advisory given  Plan Discussed with: CRNA  Anesthesia Plan Comments:         Anesthesia Quick Evaluation

## 2016-11-05 NOTE — Op Note (Signed)
PREOPERATIVE DIAGNOSIS:  Recurrent right breast cancer     POSTOPERATIVE DIAGNOSIS:  Same     PROCEDURE: left subclavian port placement, Bard ClearVue  Power Port, MRI safe, 8-French.      SURGEON:  Stark Klein, MD      ANESTHESIA:  General   FINDINGS:  Good venous return, easy flush, and tip of the catheter and   SVC 25.5 cm.      SPECIMEN:  None.      ESTIMATED BLOOD LOSS:  Minimal.      COMPLICATIONS:  None known.      PROCEDURE:  Pt was identified in the holding area and taken to   the operating room, where patient was placed supine on the operating room   table.  General anesthesia was induced.  Patient's arms were tucked and the upper   chest and neck were prepped and draped in sterile fashion.  Time-out was   performed according to the surgical safety check list.  When all was   correct, we continued.   Local anesthetic was administered over this   area at the angle of the clavicle.  The vein was accessed with 1 pass(es) of the needle. There was good venous return and the wire passed easily with no ectopy.   Fluoroscopy was used to confirm that the wire was in the vena cava.      The patient was placed back level and the area for the pocket was anethetized   with local anesthetic.  A 3-cm transverse incision was made with a #15   blade.  Cautery was used to divide the subcutaneous tissues down to the   pectoralis muscle.  An [employer] retractor was used to elevate the skin   while a pocket was created on top of the pectoralis fascia.  The port   was placed into the pocket to confirm that it was of adequate size.  The   catheter was preattached to the port.  The port was then secured to the   pectoralis fascia with four 2-0 Prolene sutures.  These were clamped and   not tied down yet.    The catheter was tunneled through to the wire exit   site.  The catheter was placed along the wire to determine what length it should be to be in the SVC.  The catheter was cut at 25.5  cm.  The tunneler sheath and dilator were passed over the wire and the dilator and wire were removed.  The catheter was advanced through the tunneler sheath and the tunneler sheath was pulled away.  Care was taken to keep the catheter in the tunneler sheath as this occurred. This was advanced and the tunneler sheath was removed.  There was good venous   return and easy flush of the catheter.  The Prolene sutures were tied   down to the pectoral fascia.  The skin was reapproximated using 3-0   Vicryl interrupted deep dermal sutures.    Fluoroscopy was used to re-confirm good position of the catheter.  The skin   was then closed using 4-0 Monocryl in a subcuticular fashion.  The port was flushed with concentrated heparin flush as well.  The wounds were then cleaned, dried, and dressed with Dermabond.  The patient was awakened from anesthesia and taken to the PACU in stable condition.  Needle, sponge, and instrument counts were correct.               Stark Klein, MD

## 2016-11-05 NOTE — Transfer of Care (Signed)
Immediate Anesthesia Transfer of Care Note  Patient: Darlene [name]  Procedure(s) Performed: INSERTION PORT-A-CATH (N/A )  Patient Location: PACU  Anesthesia Type:General  Level of Consciousness: awake, alert  and oriented  Airway & Oxygen Therapy: Patient Spontanous Breathing and Patient connected to face mask oxygen  Post-op Assessment: Report given to RN and Post -op Vital signs reviewed and stable  Post vital signs: Reviewed and stable  Last Vitals:  Vitals:   11/05/16 0750  BP: (!) 123/50  Pulse: (!) 50  Resp: 18  Temp: 36.7 C  SpO2: 98%    Last Pain:  Vitals:   11/05/16 0750  TempSrc: Oral      Patients Stated Pain Goal: 3 (46/80/32 1224)  Complications: No apparent anesthesia complications

## 2016-11-05 NOTE — H&P (Signed)
Darlene [name] is an 66 y.o. female.   Chief Complaint: breast cancer HPI:  Pt is a 66 yo F who developed recurrent breast cancer.  She has been getting herceptin via peripheral IV, but has had issues with IV access.  Requests port for access.    Past Medical History:  Diagnosis Date  . Anemia    hx of - during 1st round chemo  . Anxiety   . Arthritis    in fingers, shoulders  . Back pain   . Balance problem   . Breast cancer (Darlene [name])   . Breast cancer of upper-outer quadrant of right female breast (Darlene [name]) 07/22/2014  . Bunion, right foot   . Clumsiness   . Constipation   . Depression   . Diabetes mellitus without complication (Darlene [name])    20+ years  . Eating disorder    binge eating  . Epistaxis 08/26/2014  . Fatty liver   . HCAP (healthcare-associated pneumonia) 12/18/2014  . Headache   . Heart murmur    never had any problems  . Hyperlipidemia   . Hypertension   . Joint pain   . Multiple food allergies   . Neuromuscular disorder (Darlene [name])    diabetic neuropathy  . Obesity   . Obstructive sleep apnea on CPAP    uses cpap nightly  . Ovarian cyst rupture    possible  . Pneumonia   . Pneumonia 11/2014  . Radiation 04/05/15-05/19/15   right chest wall 50.4 Gy, mastectomy scar 10 Gy  . Sleep apnea   . Thyroid nodule 06/2014    Past Surgical History:  Procedure Laterality Date  . APPENDECTOMY    . BIOPSY THYROID Bilateral 06/2014   2 nodules - Benign   . BREAST LUMPECTOMY WITH RADIOACTIVE SEED AND SENTINEL LYMPH NODE BIOPSY Right 12/27/2014   Procedure: BREAST LUMPECTOMY WITH RADIOACTIVE SEED AND SENTINEL LYMPH NODE BIOPSY;  Surgeon: Stark Klein, MD;  Location: [place];  Service: General;  Laterality: Right;  . BREAST REDUCTION SURGERY Bilateral 01/06/2015   Procedure: BILATERAL BREAST REDUCTION, RIGHT ONCOPLASTIC RECONSTRUCTION),LEFT BREAST REDUCTION FOR SYMMETRY;  Surgeon: [name], MD;  Location: [place];  Service: Plastics;  Laterality: Bilateral;  .  BREATH TEK H PYLORI N/A 09/15/2012   Procedure: BREATH TEK H PYLORI;  Surgeon: Pedro Earls, MD;  Location: [facility];  Service: General;  Laterality: N/A;  . BUNIONECTOMY Left 12/2013  . CARPAL TUNNEL RELEASE Right   . CARPAL TUNNEL RELEASE Left   . COLONOSCOPY    . DUPUYTREN CONTRACTURE RELEASE    . GASTRIC ROUX-EN-Y N/A 11/02/2012   Procedure: LAPAROSCOPIC ROUX-EN-Y GASTRIC;  Surgeon: Pedro Earls, MD;  Location: [facility];  Service: General;  Laterality: N/A;  . IR GENERIC HISTORICAL  03/18/2016   IR US GUIDE VASC ACCESS LEFT 03/18/2016 Markus Daft, MD [facility]  . IR GENERIC HISTORICAL  03/18/2016   IR FLUORO GUIDE CV LINE LEFT 03/18/2016 Markus Daft, MD [facility]  . LAPAROSCOPIC APPENDECTOMY N/A 07/22/2015   Procedure: APPENDECTOMY LAPAROSCOPIC;  Surgeon: Michael Boston, MD;  Location: [facility];  Service: General;  Laterality: N/A;  . MASTECTOMY Right 02/15/2015   modified  . MODIFIED MASTECTOMY Right 02/15/2015   Procedure: RIGHT MODIFIED RADICAL MASTECTOMY;  Surgeon: Stark Klein, MD;  Location: [name];  Service: General;  Laterality: Right;  . PORT-A-CATH REMOVAL Left 10/10/2015   Procedure: PORT REMOVAL;  Surgeon: Stark Klein, MD;  Location: [name];  Service: General;  Laterality: Left;  . PORTACATH  PLACEMENT Left 08/02/2014   Procedure: INSERTION PORT-A-CATH;  Surgeon: Stark Klein, MD;  Location: [name];  Service: General;  Laterality: Left;  . ROTATOR CUFF REPAIR Right 2005  . SCAR REVISION Right 12/08/2015   Procedure: COMPLEX REPAIR RIGHT CHEST 10-15CM;  Surgeon: [name], MD;  Location: [name];  Service: Plastics;  Laterality: Right;  . TOE SURGERY  1970s   bone spur  . TRIGGER FINGER RELEASE     x3    Family History  Problem Relation Age of Onset  . CVA Mother   . Heart disease Mother   . Sudden death Mother   . Diabetes Father   . Heart failure Father   . Hypertension Father   . Kidney disease Father   . Obesity  Father   . Hypertension Sister   . Diabetes Brother   . Breast cancer Paternal Grandmother   . Diabetes Paternal Uncle    Social History:  reports that she has never smoked. She has never used smokeless tobacco. She reports that she drinks alcohol. She reports that she does not use drugs.  Allergies:  Allergies  Allergen Reactions  . Nsaids Other (See Comments)    H/o gastric bypass - avoid NSAIDs  . Tape Hives and Rash    Adhesives in bandaids    Medications Prior to Admission  Medication Sig Dispense Refill  . acetaminophen (TYLENOL) 325 MG tablet Take 650 mg by mouth 2 (two) times daily as needed for moderate pain or headache. Reported on 04/17/2015    . buPROPion (WELLBUTRIN SR) 200 MG 12 hr tablet Take 1 tablet (200 mg total) by mouth daily at 12 noon. 30 tablet 0  . calcium citrate-vitamin D (CITRACAL+D) 315-200 MG-UNIT tablet Take 1 tablet by mouth 3 (three) times daily.    . cholecalciferol (VITAMIN D) 1000 units tablet Take 1,000 Units by mouth daily.    . diphenhydrAMINE (BENADRYL) 25 MG tablet Take 25 mg by mouth daily as needed for allergies or sleep. Reported on 03/06/2015    . hyaluronate sodium (RADIAPLEXRX) GEL Apply 1 application topically 2 (two) times daily.    . hydrochlorothiazide (HYDRODIURIL) 25 MG tablet TAKE 1 TABLET BY MOUTH EVERY DAY 30 tablet 11  . Insulin Glargine (BASAGLAR KWIKPEN) 100 UNIT/ML SOPN Inject 0.25 mLs (25 Units total) into the skin at bedtime. 15 pen 0  . losartan (COZAAR) 100 MG tablet Take 1 tablet by mouth daily. Reported on 03/06/2015    . Melatonin 5 MG TABS Take 5 mg by mouth at bedtime. Reported on 03/06/2015    . metFORMIN (GLUCOPHAGE) 500 MG tablet Take 1 tablet (500 mg total) by mouth daily. 30 tablet 0  . Pediatric Multivitamins-Iron (FLINTSTONES PLUS IRON) chewable tablet Chew 1 tablet by mouth 2 (two) times daily.    . Probiotic Product ([employer] PROBIOTIC PO) Take 1 capsule by mouth daily. Reported on 03/06/2015    . senna-docusate (SENNA  PLUS) 8.6-50 MG tablet Take 2 tablets by mouth at bedtime.    . vortioxetine HBr (TRINTELLIX) 20 MG TABS Take 20 mg by mouth at bedtime. 30 tablet 0  . glucose blood (ONETOUCH VERIO) test strip 1 each by Other route as needed for other. Reported on 03/06/2015    . mometasone (NASONEX) 50 MCG/ACT nasal spray Place 1-2 sprays into the nose daily as needed (for allergies.).     [name] Propylene Glycol (SYSTANE BALANCE OP) Apply 1-2 drops to eye 3 (three) times daily.  Results for orders placed or performed during the hospital encounter of 11/05/16 (from the past 48 hour(s))  Hemoglobin A1c     Status: Abnormal   Collection Time: 11/05/16  7:23 AM  Result Value Ref Range   Hgb A1c MFr Bld 8.2 (H) 4.8 - 5.6 %    Comment: (NOTE) Pre diabetes:          5.7%-6.4% Diabetes:              >6.4% Glycemic control for   <7.0% adults with diabetes    Mean Plasma Glucose 188.64 mg/dL  Basic metabolic panel     Status: Abnormal   Collection Time: 11/05/16  7:24 AM  Result Value Ref Range   Sodium 138 135 - 145 mmol/L   Potassium 3.9 3.5 - 5.1 mmol/L   Chloride 107 101 - 111 mmol/L   CO2 22 22 - 32 mmol/L   Glucose, Bld 147 (H) 65 - 99 mg/dL   BUN 39 (H) 6 - 20 mg/dL   Creatinine, Ser 1.34 (H) 0.44 - 1.00 mg/dL   Calcium 9.3 8.9 - 10.3 mg/dL   GFR calc non Af Amer 41 (L) >60 mL/min   GFR calc Af Amer 47 (L) >60 mL/min    Comment: (NOTE) The eGFR has been calculated using the CKD EPI equation. This calculation has not been validated in all clinical situations. eGFR's persistently <60 mL/min signify possible Chronic Kidney Disease.    Anion gap 9 5 - 15  CBC     Status: Abnormal   Collection Time: 11/05/16  7:24 AM  Result Value Ref Range   WBC 4.7 4.0 - 10.5 K/uL   RBC 3.87 3.87 - 5.11 MIL/uL   Hemoglobin 11.4 (L) 12.0 - 15.0 g/dL   HCT 34.4 (L) 36.0 - 46.0 %   MCV 88.9 78.0 - 100.0 fL   MCH 29.5 26.0 - 34.0 pg   MCHC 33.1 30.0 - 36.0 g/dL   RDW 14.7 11.5 - 15.5 %   Platelets 108 (L) 150  - 400 K/uL    Comment: PLATELET COUNT CONFIRMED BY SMEAR  Glucose, capillary     Status: Abnormal   Collection Time: 11/05/16  8:04 AM  Result Value Ref Range   Glucose-Capillary 152 (H) 65 - 99 mg/dL   Comment 1 Notify RN    Comment 2 Document in Chart    No results found.  Review of Systems  All other systems reviewed and are negative.   Blood pressure (!) 123/50, pulse (!) 50, temperature 98.1 F (36.7 C), temperature source Oral, resp. rate 18, last menstrual period 10/22/2007, SpO2 98 %. Physical Exam  Constitutional: She is oriented to person, place, and time. She appears well-developed and well-nourished. No distress.  HENT:  Head: Normocephalic and atraumatic.  Eyes: Pupils are equal, round, and reactive to light. Conjunctivae are normal. Right eye exhibits no discharge. Left eye exhibits no discharge.  Neck: Normal range of motion. Neck supple. No thyromegaly present.  Cardiovascular: Normal rate.   Respiratory: Effort normal. No respiratory distress.  GI: Soft.  Musculoskeletal: Normal range of motion.  Neurological: She is alert and oriented to person, place, and time.  Skin: Skin is warm and dry. She is not diaphoretic.  Psychiatric: She has a normal mood and affect. Her behavior is normal. Judgment and thought content normal.     Assessment/Plan Recurrent right breast cancer. Place port. Reviewed risks. Pt has had before.  Will make attempts to replace on left.  Stark Klein, MD 11/05/2016, 9:30 AM

## 2016-11-05 NOTE — Discharge Instructions (Addendum)
[facility] °Office Phone Number [phone] ° ° POST OP INSTRUCTIONS ° °Always review your discharge instruction sheet given to you by the facility where your surgery was performed. ° °IF YOU HAVE DISABILITY OR FAMILY LEAVE FORMS, YOU MUST BRING THEM TO THE OFFICE FOR PROCESSING.  DO NOT GIVE THEM TO YOUR DOCTOR. ° °1. A prescription for pain medication may be given to you upon discharge.  Take your pain medication as prescribed, if needed.  If narcotic pain medicine is not needed, then you may take acetaminophen (Tylenol) or ibuprofen (Advil) as needed. °2. Take your usually prescribed medications unless otherwise directed °3. If you need a refill on your pain medication, please contact your pharmacy.  They will contact our office to request authorization.  Prescriptions will not be filled after 5pm or on week-ends. °4. You should eat very light the first 24 hours after surgery, such as soup, crackers, pudding, etc.  Resume your normal diet the day after surgery °5. It is common to experience some constipation if taking pain medication after surgery.  Increasing fluid intake and taking a stool softener will usually help or prevent this problem from occurring.  A mild laxative (Milk of Magnesia or Miralax) should be taken according to package directions if there are no bowel movements after 48 hours. °6. You may shower in 48 hours.  The surgical glue will flake off in 2-3 weeks.   °7. ACTIVITIES:  No strenuous activity or heavy lifting for 1 week.   °a. You may drive when you no longer are taking prescription pain medication, you can comfortably wear a seatbelt, and you can safely maneuver your car and apply brakes. °b. RETURN TO WORK:  __________1 week if applicable_______________ °You should see your doctor in the office for a follow-up appointment approximately three-four weeks after your surgery.   ° °WHEN TO CALL YOUR DOCTOR: °1. Fever over 101.0 °2. Nausea and/or vomiting. °3. Extreme  swelling or bruising. °4. Continued bleeding from incision. °5. Increased pain, redness, or drainage from the incision. ° °The clinic staff is available to answer your questions during regular business hours.  Please don’t hesitate to call and ask to speak to one of the nurses for clinical concerns.  If you have a medical emergency, go to the nearest emergency room or call 911.  A surgeon from [facility] is always on call at the hospital. ° °For further questions, please visit [email or web address]  ° °

## 2016-11-05 NOTE — Anesthesia Procedure Notes (Signed)
Procedure Name: LMA Insertion Date/Time: 11/05/2016 10:18 AM Performed by: Bryson Corona Pre-anesthesia Checklist: Patient identified, Emergency Drugs available, Suction available and Patient being monitored Patient Re-evaluated:Patient Re-evaluated prior to induction Oxygen Delivery Method: Circle system utilized Preoxygenation: Pre-oxygenation with 100% oxygen Induction Type: IV induction Ventilation: Mask ventilation without difficulty LMA: LMA inserted LMA Size: 4.0 Number of attempts: 1 Tube secured with: Tape

## 2016-11-05 NOTE — Anesthesia Postprocedure Evaluation (Signed)
Anesthesia Post Note  Patient: Darlene [name]  Procedure(s) Performed: INSERTION PORT-A-CATH (N/A )     Patient location during evaluation: PACU Anesthesia Type: General Level of consciousness: awake and alert Pain management: pain level controlled Vital Signs Assessment: post-procedure vital signs reviewed and stable Respiratory status: spontaneous breathing, nonlabored ventilation, respiratory function stable and patient connected to nasal cannula oxygen Cardiovascular status: blood pressure returned to baseline and stable Postop Assessment: no apparent nausea or vomiting Anesthetic complications: no    Last Vitals:  Vitals:   11/05/16 1123 11/05/16 1145  BP: (!) 149/67 (!) 160/65  Pulse: (!) 52 (!) 52  Resp: 15 17  Temp:  (!) 36.1 C  SpO2: 100% 100%    Last Pain:  Vitals:   11/05/16 1145  TempSrc:   PainSc: 0-No pain                 Tiajuana Amass

## 2016-11-06 ENCOUNTER — Other Ambulatory Visit: Payer: Commercial Managed Care - PPO

## 2016-11-06 ENCOUNTER — Encounter (HOSPITAL_COMMUNITY): Payer: Self-pay | Admitting: General Surgery

## 2016-11-06 ENCOUNTER — Ambulatory Visit: Payer: Commercial Managed Care - PPO | Admitting: Adult Health

## 2016-11-07 ENCOUNTER — Other Ambulatory Visit (INDEPENDENT_AMBULATORY_CARE_PROVIDER_SITE_OTHER): Payer: Self-pay

## 2016-11-07 DIAGNOSIS — E119 Type 2 diabetes mellitus without complications: Secondary | ICD-10-CM

## 2016-11-07 MED ORDER — METFORMIN HCL 500 MG PO TABS: 500.0000 mg | ORAL_TABLET | Freq: Every day | ORAL | 0 refills | Status: DC

## 2016-11-08 ENCOUNTER — Ambulatory Visit: Payer: Commercial Managed Care - PPO

## 2016-11-08 ENCOUNTER — Other Ambulatory Visit (HOSPITAL_BASED_OUTPATIENT_CLINIC_OR_DEPARTMENT_OTHER): Payer: Commercial Managed Care - PPO

## 2016-11-08 ENCOUNTER — Encounter: Payer: Self-pay | Admitting: Adult Health

## 2016-11-08 ENCOUNTER — Telehealth: Payer: Self-pay | Admitting: Adult Health

## 2016-11-08 ENCOUNTER — Ambulatory Visit (HOSPITAL_BASED_OUTPATIENT_CLINIC_OR_DEPARTMENT_OTHER): Payer: Commercial Managed Care - PPO

## 2016-11-08 ENCOUNTER — Ambulatory Visit (HOSPITAL_BASED_OUTPATIENT_CLINIC_OR_DEPARTMENT_OTHER): Payer: Commercial Managed Care - PPO | Admitting: Adult Health

## 2016-11-08 VITALS — BP 132/50 | HR 63 | Temp 97.7°F | Resp 20 | Ht 69.0 in | Wt 223.1 lb

## 2016-11-08 DIAGNOSIS — C50411 Malignant neoplasm of upper-outer quadrant of right female breast: Secondary | ICD-10-CM

## 2016-11-08 DIAGNOSIS — Z171 Estrogen receptor negative status [ER-]: Secondary | ICD-10-CM

## 2016-11-08 DIAGNOSIS — Z5112 Encounter for antineoplastic immunotherapy: Secondary | ICD-10-CM | POA: Diagnosis not present

## 2016-11-08 DIAGNOSIS — Z95828 Presence of other vascular implants and grafts: Secondary | ICD-10-CM

## 2016-11-08 DIAGNOSIS — C50911 Malignant neoplasm of unspecified site of right female breast: Secondary | ICD-10-CM

## 2016-11-08 DIAGNOSIS — C773 Secondary and unspecified malignant neoplasm of axilla and upper limb lymph nodes: Secondary | ICD-10-CM

## 2016-11-08 LAB — COMPREHENSIVE METABOLIC PANEL
ALT: 54 U/L (ref 0–55)
AST: 47 U/L — ABNORMAL HIGH (ref 5–34)
Albumin: 3.6 g/dL (ref 3.5–5.0)
Alkaline Phosphatase: 114 U/L (ref 40–150)
Anion Gap: 8 mEq/L (ref 3–11)
BUN: 34.2 mg/dL — ABNORMAL HIGH (ref 7.0–26.0)
CO2: 26 mEq/L (ref 22–29)
Calcium: 9.4 mg/dL (ref 8.4–10.4)
Chloride: 107 mEq/L (ref 98–109)
Creatinine: 1.4 mg/dL — ABNORMAL HIGH (ref 0.6–1.1)
EGFR: 38 mL/min/{1.73_m2} — ABNORMAL LOW (ref >60)
Glucose: 186 mg/dl — ABNORMAL HIGH (ref 70–140)
Potassium: 4.6 mEq/L (ref 3.5–5.1)
Sodium: 140 mEq/L (ref 136–145)
Total Bilirubin: 0.72 mg/dL (ref 0.20–1.20)
Total Protein: 6.7 g/dL (ref 6.4–8.3)

## 2016-11-08 LAB — CBC WITH DIFFERENTIAL/PLATELET
BASO%: 0.2 % (ref 0.0–2.0)
Basophils Absolute: 0 10*3/uL (ref 0.0–0.1)
EOS%: 2.9 % (ref 0.0–7.0)
Eosinophils Absolute: 0.1 10*3/uL (ref 0.0–0.5)
HCT: 32 % — ABNORMAL LOW (ref 34.8–46.6)
HGB: 10.7 g/dL — ABNORMAL LOW (ref 11.6–15.9)
LYMPH%: 24.2 % (ref 14.0–49.7)
MCH: 30.3 pg (ref 25.1–34.0)
MCHC: 33.4 g/dL (ref 31.5–36.0)
MCV: 90.7 fL (ref 79.5–101.0)
MONO#: 0.4 10*3/uL (ref 0.1–0.9)
MONO%: 7.3 % (ref 0.0–14.0)
NEUT#: 3.1 10*3/uL (ref 1.5–6.5)
NEUT%: 65.4 % (ref 38.4–76.8)
Platelets: 97 10*3/uL — ABNORMAL LOW (ref 145–400)
RBC: 3.53 10*6/uL — ABNORMAL LOW (ref 3.70–5.45)
RDW: 14.7 % — ABNORMAL HIGH (ref 11.2–14.5)
WBC: 4.8 10*3/uL (ref 3.9–10.3)
lymph#: 1.2 10*3/uL (ref 0.9–3.3)

## 2016-11-08 MED ORDER — SODIUM CHLORIDE 0.9% FLUSH
10.0000 mL | INTRAVENOUS | Status: DC | PRN
  Administered 2016-11-08: 10 mL
  Filled 2016-11-08: qty 10

## 2016-11-08 MED ORDER — SODIUM CHLORIDE 0.9 % IV SOLN
Freq: Once | INTRAVENOUS | Status: AC
  Administered 2016-11-08: 11:00:00 via INTRAVENOUS

## 2016-11-08 MED ORDER — DIPHENHYDRAMINE HCL 12.5 MG/5ML PO ELIX
12.5000 mg | ORAL_SOLUTION | Freq: Once | ORAL | Status: AC
  Administered 2016-11-08: 12.5 mg via ORAL
  Filled 2016-11-08: qty 5

## 2016-11-08 MED ORDER — ACETAMINOPHEN 325 MG PO TABS
650.0000 mg | ORAL_TABLET | Freq: Once | ORAL | Status: AC
  Administered 2016-11-08: 650 mg via ORAL

## 2016-11-08 MED ORDER — HEPARIN SOD (PORK) LOCK FLUSH 100 UNIT/ML IV SOLN
500.0000 [IU] | Freq: Once | INTRAVENOUS | Status: AC | PRN
  Administered 2016-11-08: 500 [IU]
  Filled 2016-11-08: qty 5

## 2016-11-08 MED ORDER — SODIUM CHLORIDE 0.9 % IJ SOLN
10.0000 mL | INTRAMUSCULAR | Status: DC | PRN
  Administered 2016-11-08: 10 mL via INTRAVENOUS
  Filled 2016-11-08: qty 10

## 2016-11-08 MED ORDER — SODIUM CHLORIDE 0.9 % IV SOLN
600.0000 mg | Freq: Once | INTRAVENOUS | Status: AC
  Administered 2016-11-08: 600 mg via INTRAVENOUS
  Filled 2016-11-08: qty 28.57

## 2016-11-08 MED ORDER — ACETAMINOPHEN 325 MG PO TABS
ORAL_TABLET | ORAL | Status: AC
  Filled 2016-11-08: qty 2

## 2016-11-08 NOTE — Telephone Encounter (Signed)
Spoke with patient and got appts scheduled per 10/19 los. Patient will receive updated schedule in infusion area.

## 2016-11-08 NOTE — Progress Notes (Signed)
[facility]  [phone] [phone]   ID: Darlene [name] DOB: 1951-01-05  MR#: [id number]  NOM#:[id number]  Patient Care Team: Vernie Shanks, MD as PCP - General (Family Medicine) Himmelrich, Bryson Ha, RD (Inactive) as Dietitian (Bariatrics) Stark Klein, MD as Consulting Physician (General Surgery) Magrinat, Virgie Dad, MD as Consulting Physician (Oncology) Arloa Koh, MD as Consulting Physician (Radiation Oncology) Mauro Kaufmann, RN as Registered Nurse Rockwell Germany, RN as Registered Nurse Jacelyn Pi, MD as Consulting Physician (Endocrinology) Kem Boroughs, [place] as Nurse Practitioner (Family Medicine) [name], MD as Consulting Physician (Plastic Surgery) Larey Dresser, MD as Consulting Physician (Cardiology) Gery Pray, MD as Consulting Physician (Radiation Oncology) PCP: Vernie Shanks, MD OTHER MD:  CHIEF COMPLAINT:  HER-2 positive, estrogen receptor negative locally recurrent disease  CURRENT TREATMENT:  Trastuzumab, radiation therapy  INTERVAL HISTORY:  Darlene [name] returns today for follow-up of her HER-2/neu positive estrogen receptor negative breast cancer accompanied by her husband. Darlene [name] continues on trastuzumab every 3 weeks, which is her maintenance treatment.  She did finish radiation about a week ago.  She is feeling well since then.    REVIEW OF SYSTEMS: She denies any new pain, chest pain, shortness of breath, leg swelling or further concerns.  A detailed ROS is non contributory.    BREAST CANCER HISTORY: From the original intake note:  "Darlene [name]" had screening mammography at her gynecologist suggestive of a possible abnormality in the right breast. However right diagnostic mammogram 04/12/2013 at [facility] was negative. More recently however, she noted a lump in her right breast and brought it to her gynecologist's attention. On 07/18/2014 the patient had bilateral diagnostic mammography with tomosynthesis  at [facility]. The breast density was category B. In the right breast upper outer quadrant there was an area of focal asymmetry with indistinct margins.[name] Ultrasound was obtained and showed in addition to the mass in question, measuring 1.7 cm, an abnormal-appearing lymph node in the right axillary tail.  On 07/19/2014 the patient underwent biopsy of the right breast massin question as well as a right axillary lymph node. Both were positive for invasive ductal carcinoma, grade 2, estrogen and progesterone receptor negative, with an MIB-1 of 90%, and HER-2 pending. Incidentally the lymph node biopsy showed a lymphocytic inflammatory component but no lymph node architecture.  Her subsequent history is as detailed below.  PAST MEDICAL HISTORY: Past Medical History:  Diagnosis Date  . Anemia    hx of - during 1st round chemo  . Anxiety   . Arthritis    in fingers, shoulders  . Back pain   . Balance problem   . Breast cancer ([place])   . Breast cancer of upper-outer quadrant of right female breast ([place]) 07/22/2014  . Bunion, right foot   . Clumsiness   . Constipation   . Depression   . Diabetes mellitus without complication ([place])    96+ years  . Eating disorder    binge eating  . Epistaxis 08/26/2014  . Fatty liver   . HCAP (healthcare-associated pneumonia) 12/18/2014  . Headache   . Heart murmur    never had any problems  . Hyperlipidemia   . Hypertension   . Joint pain   . Multiple food allergies   . Neuromuscular disorder ([place])    diabetic neuropathy  . Obesity   . Obstructive sleep apnea on CPAP    uses cpap nightly  . Ovarian cyst rupture    possible  . Pneumonia   .  Pneumonia 11/2014  . Radiation 04/05/15-05/19/15   right chest wall 50.4 Gy, mastectomy scar 10 Gy  . Sleep apnea   . Thyroid nodule 06/2014    PAST SURGICAL HISTORY: Past Surgical History:  Procedure Laterality Date  . APPENDECTOMY    . BIOPSY THYROID Bilateral 06/2014   2 nodules - Benign   . BREAST LUMPECTOMY  WITH RADIOACTIVE SEED AND SENTINEL LYMPH NODE BIOPSY Right 12/27/2014   Procedure: BREAST LUMPECTOMY WITH RADIOACTIVE SEED AND SENTINEL LYMPH NODE BIOPSY;  Surgeon: Stark Klein, MD;  Location: [facility];  Service: General;  Laterality: Right;  . BREAST REDUCTION SURGERY Bilateral 01/06/2015   Procedure: BILATERAL BREAST REDUCTION, RIGHT ONCOPLASTIC RECONSTRUCTION),LEFT BREAST REDUCTION FOR SYMMETRY;  Surgeon: [name], MD;  Location: [facility];  Service: Plastics;  Laterality: Bilateral;  . BREATH TEK H PYLORI N/A 09/15/2012   Procedure: BREATH TEK H PYLORI;  Surgeon: Pedro Earls, MD;  Location: [facility];  Service: General;  Laterality: N/A;  . BUNIONECTOMY Left 12/2013  . CARPAL TUNNEL RELEASE Right   . CARPAL TUNNEL RELEASE Left   . COLONOSCOPY    . DUPUYTREN CONTRACTURE RELEASE    . GASTRIC ROUX-EN-Y N/A 11/02/2012   Procedure: LAPAROSCOPIC ROUX-EN-Y GASTRIC;  Surgeon: Pedro Earls, MD;  Location: [facility];  Service: General;  Laterality: N/A;  . IR GENERIC HISTORICAL  03/18/2016   IR US GUIDE VASC ACCESS LEFT 03/18/2016 Markus Daft, MD [facility]  . IR GENERIC HISTORICAL  03/18/2016   IR FLUORO GUIDE CV LINE LEFT 03/18/2016 Markus Daft, MD [facility]  . LAPAROSCOPIC APPENDECTOMY N/A 07/22/2015   Procedure: APPENDECTOMY LAPAROSCOPIC;  Surgeon: Michael Boston, MD;  Location: [facility];  Service: General;  Laterality: N/A;  . MASTECTOMY Right 02/15/2015   modified  . MODIFIED MASTECTOMY Right 02/15/2015   Procedure: RIGHT MODIFIED RADICAL MASTECTOMY;  Surgeon: Stark Klein, MD;  Location: [facility];  Service: General;  Laterality: Right;  . PORT-A-CATH REMOVAL Left 10/10/2015   Procedure: PORT REMOVAL;  Surgeon: Stark Klein, MD;  Location: [facility];  Service: General;  Laterality: Left;  . PORTACATH PLACEMENT Left 08/02/2014   Procedure: INSERTION PORT-A-CATH;  Surgeon: Stark Klein, MD;  Location: [facility];  Service: General;  Laterality: Left;  . PORTACATH  PLACEMENT N/A 11/05/2016   Procedure: INSERTION PORT-A-CATH;  Surgeon: Stark Klein, MD;  Location: [facility];  Service: General;  Laterality: N/A;  . ROTATOR CUFF REPAIR Right 2005  . SCAR REVISION Right 12/08/2015   Procedure: COMPLEX REPAIR RIGHT CHEST 10-15CM;  Surgeon: [name], MD;  Location: [facility];  Service: Plastics;  Laterality: Right;  . TOE SURGERY  1970s   bone spur  . TRIGGER FINGER RELEASE     x3    FAMILY HISTORY Family History  Problem Relation Age of Onset  . CVA Mother   . Heart disease Mother   . Sudden death Mother   . Diabetes Father   . Heart failure Father   . Hypertension Father   . Kidney disease Father   . Obesity Father   . Hypertension Sister   . Diabetes Brother   . Breast cancer Paternal Grandmother   . Diabetes Paternal Uncle    the patient's father died from complications of diabetes at age 42. The patient's mother died at age 36 with a subarachnoid hemorrhage. The patient had one brother, one sister. The only breast cancer was the patient's paternal grandmother diagnosed in her 37s. There is no history of  ovarian cancer in the family.  GYNECOLOGIC HISTORY:  Patient's last menstrual period was 10/22/2007.  menarche age 63, the patient is GX P0. She went through the change of life in 2011. She did not take hormone replacement.  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 Her husband [name] is a retired Visual merchandiser (he taught at [employer]).  [name] has 2 children from an earlier marriage, Rockey Situ who teaches in [place], and Kathyjo Briere,  who works at the [employer] in in [place]. He has 1 grand son, aged 71. The patient and her husband are not church attender's    ADVANCED DIRECTIVES:  Not in place   HEALTH MAINTENANCE: Social History  Substance Use Topics  . Smoking status: Never Smoker  . Smokeless tobacco: Never Used    . Alcohol use 0.0 oz/week     Comment: social     Colonoscopy:  May 2016  PAP:  Bone density: 07/05/2013 normal, with a T score of 0.4  Lipid panel:  Allergies  Allergen Reactions  . Nsaids Other (See Comments)    H/o gastric bypass - avoid NSAIDs  . Tape Hives and Rash    Adhesives in bandaids    Current Outpatient Prescriptions  Medication Sig Dispense Refill  . acetaminophen (TYLENOL) 325 MG tablet Take 650 mg by mouth 2 (two) times daily as needed for moderate pain or headache. Reported on 04/17/2015    . buPROPion (WELLBUTRIN SR) 200 MG 12 hr tablet Take 1 tablet (200 mg total) by mouth daily at 12 noon. 30 tablet 0  . calcium citrate-vitamin D (CITRACAL+D) 315-200 MG-UNIT tablet Take 1 tablet by mouth 3 (three) times daily.    . cholecalciferol (VITAMIN D) 1000 units tablet Take 1,000 Units by mouth daily.    . diphenhydrAMINE (BENADRYL) 25 MG tablet Take 25 mg by mouth daily as needed for allergies or sleep. Reported on 03/06/2015    . glucose blood (ONETOUCH VERIO) test strip 1 each by Other route as needed for other. Reported on 03/06/2015    . hyaluronate sodium (RADIAPLEXRX) GEL Apply 1 application topically 2 (two) times daily.    . hydrochlorothiazide (HYDRODIURIL) 25 MG tablet TAKE 1 TABLET BY MOUTH EVERY DAY 30 tablet 11  . Insulin Glargine (BASAGLAR KWIKPEN) 100 UNIT/ML SOPN Inject 0.25 mLs (25 Units total) into the skin at bedtime. 15 pen 0  . losartan (COZAAR) 100 MG tablet Take 1 tablet by mouth daily. Reported on 03/06/2015    . Melatonin 5 MG TABS Take 5 mg by mouth at bedtime. Reported on 03/06/2015    . metFORMIN (GLUCOPHAGE) 500 MG tablet Take 1 tablet (500 mg total) by mouth daily. 30 tablet 0  . mometasone (NASONEX) 50 MCG/ACT nasal spray Place 1-2 sprays into the nose daily as needed (for allergies.).     [name] Pediatric Multivitamins-Iron (FLINTSTONES PLUS IRON) chewable tablet Chew 1 tablet by mouth 2 (two) times daily.    . Probiotic Product ([employer] PROBIOTIC PO)  Take 1 capsule by mouth daily. Reported on 03/06/2015    . Propylene Glycol (SYSTANE BALANCE OP) Apply 1-2 drops to eye 3 (three) times daily.    [name] senna-docusate (SENNA PLUS) 8.6-50 MG tablet Take 2 tablets by mouth at bedtime.    . vortioxetine HBr (TRINTELLIX) 20 MG TABS Take 20 mg by mouth at bedtime. 30 tablet 0   No current facility-administered medications for this visit.  OBJECTIVE:   Vitals:   11/08/16 0945  BP: (!) 132/50  Pulse: 63  Resp: 20  Temp: 97.7 F (36.5 C)  SpO2: 96%     Body mass index is 32.95 kg/m.    ECOG FS:1 - Symptomatic but completely ambulatory Filed Weights   11/08/16 0945  Weight: 223 lb 1.6 oz (101.2 kg)   GENERAL: Patient is a well appearing female in no acute distress HEENT:  Sclerae anicteric.  Oropharynx clear and moist. No ulcerations or evidence of oropharyngeal candidiasis. Neck is supple.  NODES:  No cervical, supraclavicular, or axillary lymphadenopathy palpated.  BREAST EXAM:  Deferred. LUNGS:  Clear to auscultation bilaterally.  No wheezes or rhonchi. HEART:  Regular rate and rhythm. No murmur appreciated. ABDOMEN:  Soft, nontender.  Positive, normoactive bowel sounds. No organomegaly palpated. MSK:  No focal spinal tenderness to palpation. Full range of motion bilaterally in the upper extremities. EXTREMITIES:  No peripheral edema.   SKIN:  Clear with no obvious rashes or skin changes. No nail dyscrasia. NEURO:  Nonfocal. Well oriented.  Appropriate affect.   LAB RESULTS:  CMP     Component Value Date/Time   NA 138 11/05/2016 0724   NA 141 10/15/2016 0914   K 3.9 11/05/2016 0724   K 4.2 10/15/2016 0914   CL 107 11/05/2016 0724   CO2 22 11/05/2016 0724   CO2 24 10/15/2016 0914   GLUCOSE 147 (H) 11/05/2016 0724   GLUCOSE 189 (H) 10/15/2016 0914   BUN 39 (H) 11/05/2016 0724   BUN 37.6 (H) 10/15/2016 0914   CREATININE 1.34 (H) 11/05/2016 0724   CREATININE 1.4 (H) 10/15/2016 0914   CALCIUM 9.3 11/05/2016 0724   CALCIUM  9.6 10/15/2016 0914   PROT 6.9 10/15/2016 0914   ALBUMIN 3.5 10/15/2016 0914   AST 39 (H) 10/15/2016 0914   ALT 45 10/15/2016 0914   ALKPHOS 112 10/15/2016 0914   BILITOT 0.74 10/15/2016 0914   GFRNONAA 41 (L) 11/05/2016 0724   GFRNONAA 55 (L) 02/18/2013 0827   GFRAA 47 (L) 11/05/2016 0724   GFRAA 63 02/18/2013 0827    INo results found for: SPEP, UPEP  Lab Results  Component Value Date   WBC 4.8 11/08/2016   NEUTROABS 3.1 11/08/2016   HGB 10.7 (L) 11/08/2016   HCT 32.0 (L) 11/08/2016   MCV 90.7 11/08/2016   PLT 97 (L) 11/08/2016      Chemistry      Component Value Date/Time   NA 138 11/05/2016 0724   NA 141 10/15/2016 0914   K 3.9 11/05/2016 0724   K 4.2 10/15/2016 0914   CL 107 11/05/2016 0724   CO2 22 11/05/2016 0724   CO2 24 10/15/2016 0914   BUN 39 (H) 11/05/2016 0724   BUN 37.6 (H) 10/15/2016 0914   CREATININE 1.34 (H) 11/05/2016 0724   CREATININE 1.4 (H) 10/15/2016 0914      Component Value Date/Time   CALCIUM 9.3 11/05/2016 0724   CALCIUM 9.6 10/15/2016 0914   ALKPHOS 112 10/15/2016 0914   AST 39 (H) 10/15/2016 0914   ALT 45 10/15/2016 0914   BILITOT 0.74 10/15/2016 0914       No results found for: LABCA2  No components found for: LABCA125  No results for input(s): INR in the last 168 hours.  Urinalysis    Component Value Date/Time   COLORURINE YELLOW 07/21/2015 2328   APPEARANCEUR CLEAR 07/21/2015 2328   LABSPEC 1.020 07/21/2015 2328   PHURINE 6.0 07/21/2015 2328   GLUCOSEU  NEGATIVE 07/21/2015 2328   HGBUR TRACE (A) 07/21/2015 2328   BILIRUBINUR NEGATIVE 07/21/2015 2328   BILIRUBINUR neg 06/29/2013 [place] 07/21/2015 2328   PROTEINUR 30 (A) 07/21/2015 2328   UROBILINOGEN negative 06/29/2013 1017   NITRITE NEGATIVE 07/21/2015 2328   LEUKOCYTESUR NEGATIVE 07/21/2015 2328    STUDIES: Dg Chest Port 1 View  Result Date: 11/05/2016 CLINICAL DATA:  Status post Port-A-Cath placement via the left subclavian approach.  EXAM: PORTABLE CHEST 1 VIEW COMPARISON:  Chest x-ray of December 18, 2014. FINDINGS: The porta catheter tip projects over the midportion of the SVC. The reservoir lies over the upper outer left pectoral region. There is no postprocedure pneumothorax. The lungs are clear. The heart and pulmonary vascularity are normal. There is calcification in the wall of the aortic arch. There is mild multilevel degenerative disc disease of the thoracic spine. IMPRESSION: There is no postprocedure complication following Port-A-Cath placement. Electronically Signed   By: David  Martinique M.D.   On: 11/05/2016 11:45   Dg Fluoro Guide Cv Line-no Report  Result Date: 11/05/2016 Fluoroscopy was utilized by the requesting physician.  No radiographic interpretation.    ASSESSMENT: 66 y.o. [place] woman status post right breast upper outer quadrant and right axillary lymph node biopsy 07/19/2014 for a cT2  pN1, stage IIB  invasive ductal carcinoma, grade 2, estrogen and progesterone receptor negative, with an MIB-1 of 90%, and HER-2 amplified  (1) neoadjuvant chemotherapy started 08/08/2014, consisting of carboplatin, docetaxel, trastuzumab and pertuzumab given every 3 weeks 6, completed 11/21/2014  (a) breast MRI after initial 3 cycles shows a significant response  (b) breast MRI after 6 cycles shows a complete radiologic response   (2) trastuzumab continued through 10/02/2015 (to end of capecitabine therapy)  (a) echo 02/13/2015 shows an EF of 60-65%  (b)  Echo 05/09/2015 shows an ejection fraction of 60-65%  (c) echocardiogram 06/27/2015 shows an ejection fraction of 55-60%.  (3) right lumpectomy with sentinel lymph node biopsy on 12/27/14 showed a residual  ypT2 ypN1a, stage IIB invasive ductal carcinoma, grade 2, with repeat estrogen and progesterone receptors again negative  (4) status post right oncoplastic surgery (with left reduction mammoplasty) 01/06/2015 showing in the right breast multiple foci of  intralymphatic carcinoma in random sections  (5)  Right modified radical mastectomy 02/15/2014 showed no residual invasive disease; there was DCIS but margins were negative; all 15 axillary lymph nodes were clear; ypTis ypN0  (a)  The patient has decided against reconstruction.  (6)  Postmastectomy radiation completed 05/18/2015 with capecitabine sensitization  (7) adjuvant capecitabine continued through 09/27/2015  (8) hepatic steatosis with increased fibrosis score (at risk for cirrhosis)  (9) thyroid biopsy (left lobe inferiorly) 2 shows benign tissue 06/28/2015  (10) LOCAL RECURRENCE:  (a) PET scan 12/06/2015 Notes a right retropectoral lymph node measuring 0.9 cm with an SUV max of 5.5  (b) repeat PET scan 03/11/2016 finds the right subpectoral lymph node to now measure 1.3 cm with an SUV max of 11.8.  (c) PET scan 05/07/2016 shows a significant response to T-DM 1  (11) started T-DM1 03/21/2016, completed 8 doses 08/13/2016  (a) PET scan 08/19/2016 shows a good response but measurable residual disease  (12) maintenance trastuzumab started 09/03/2016  (a) repeat echocardiogram 07/08/2016 shows an ejection fraction of 60-65%.  (b) echocardiogram on 10/08/2016 demonstrates LVEF of 55-60% (followed by Dr. Haroldine Laws)  (13)  radiation to the area of persistent disease in the right upper chest completed 10/30/2016  PLAN: Darlene [name] is  doing well today.  She is tolerating the Trastuzumab well.  She is finished with radiation and tolerated it well.  She will proceed with Trastuzumab today.  She will return in 3 weeks for labs, f/u, and Trastuzumab.  I requested she be scheduled through the new year, and we will make any necessary changes if needed in 3 weeks when she follows up.    She knows to call for any problems that may develop before the next visit  A total of (30) minutes of face-to-face time was spent with this patient with greater than 50% of that time in counseling and  care-coordination.  Scot Dock, NP   11/08/2016 10:02 AM

## 2016-11-08 NOTE — Patient Instructions (Signed)
[facility] Discharge Instructions for Patients Receiving Chemotherapy  Today you received the following chemotherapy agents Herceptin  To help prevent nausea and vomiting after your treatment, we encourage you to take your nausea medication as directed   If you develop nausea and vomiting that is not controlled by your nausea medication, call the clinic.   BELOW ARE SYMPTOMS THAT SHOULD BE REPORTED IMMEDIATELY:  *FEVER GREATER THAN 100.5 F  *CHILLS WITH OR WITHOUT FEVER  NAUSEA AND VOMITING THAT IS NOT CONTROLLED WITH YOUR NAUSEA MEDICATION  *UNUSUAL SHORTNESS OF BREATH  *UNUSUAL BRUISING OR BLEEDING  TENDERNESS IN MOUTH AND THROAT WITH OR WITHOUT PRESENCE OF ULCERS  *URINARY PROBLEMS  *BOWEL PROBLEMS  UNUSUAL RASH Items with * indicate a potential emergency and should be followed up as soon as possible.  Feel free to call the clinic should you have any questions or concerns. The clinic phone number is [phone].  Please show the CHEMO ALERT CARD at check-in to the Emergency Department and triage nurse.   

## 2016-11-18 ENCOUNTER — Ambulatory Visit (INDEPENDENT_AMBULATORY_CARE_PROVIDER_SITE_OTHER): Payer: Commercial Managed Care - PPO | Admitting: Family Medicine

## 2016-11-18 VITALS — BP 120/71 | HR 54 | Temp 97.8°F | Ht 69.0 in | Wt 219.0 lb

## 2016-11-18 DIAGNOSIS — Z9189 Other specified personal risk factors, not elsewhere classified: Secondary | ICD-10-CM

## 2016-11-18 DIAGNOSIS — Z794 Long term (current) use of insulin: Secondary | ICD-10-CM

## 2016-11-18 DIAGNOSIS — E669 Obesity, unspecified: Secondary | ICD-10-CM

## 2016-11-18 DIAGNOSIS — Z6832 Body mass index (BMI) 32.0-32.9, adult: Secondary | ICD-10-CM

## 2016-11-18 DIAGNOSIS — E119 Type 2 diabetes mellitus without complications: Secondary | ICD-10-CM

## 2016-11-18 DIAGNOSIS — F3289 Other specified depressive episodes: Secondary | ICD-10-CM | POA: Diagnosis not present

## 2016-11-18 MED ORDER — LIRAGLUTIDE 18 MG/3ML ~~LOC~~ SOPN: 0.6000 mg | PEN_INJECTOR | SUBCUTANEOUS | 0 refills | Status: DC

## 2016-11-18 MED ORDER — INSULIN PEN NEEDLE 32G X 4 MM MISC: 1.0000 | Freq: Every day | 0 refills | Status: DC

## 2016-11-18 NOTE — Progress Notes (Signed)
Office: [phone]  /  Fax: [phone]   HPI:   Chief Complaint: OBESITY Darlene [name] is here to discuss her progress with her obesity treatment plan. She is on the keep a food journal with 1500 calories and 80 grams of protein daily and is following her eating plan approximately 50 % of the time. She states she is exercising 0 minutes 0 times per week. Darlene [name] continues to do well with weight loss even while undergoing chemo therapy for breast cancer. She is still struggling with emotional eating and making good food choices and not always making her protein goals.  Her weight is 219 lb (99.3 kg) today and has had a weight loss of 2 pounds over a period of 2 weeks since her last visit. She has lost 6 lbs since starting treatment with [name].  Diabetes II Darlene [name] has a diagnosis of diabetes type II. Darlene [name] is on 32-34 units of her insulin, fasting BGs mostly range between 90-120's and she is on metformin. She denies any hypoglycemic episodes. Last A1c was 8.2 on 11/05/16. She has been working on intensive lifestyle modifications including diet, exercise, and weight loss to help control her blood glucose levels.  At risk for cardiovascular disease Darlene [name] is at a higher than average risk for cardiovascular disease due to obesity and diabetes II. She currently denies any chest pain.  Depression with emotional eating behaviors Darlene [name] is struggling with emotional eating and using food for comfort to the extent that it is negatively impacting her health. She often snacks when she is not hungry. Darlene [name] sometimes feels she is out of control and then feels guilty that she made poor food choices. She is more in control of her emotional eating and has been working on behavior modification techniques to help reduce her emotional eating and has been somewhat successful. Her mood has improved with increase in Wellbutrin and she shows no sign of suicidal or homicidal ideations.  Depression screen Darlene [name]  2/9 09/12/2016 08/09/2016 01/01/2016 11/15/2015 10/12/2015  Decreased Interest 0 1 0 0 0  Down, Depressed, Hopeless 0 1 0 0 0  PHQ - 2 Score 0 2 0 0 0  Altered sleeping - 0 - - -  Tired, decreased energy - 1 - - -  Change in appetite - 2 - - -  Feeling bad or failure about yourself  - 2 - - -  Trouble concentrating - 1 - - -  Moving slowly or fidgety/restless - 1 - - -  Suicidal thoughts - 0 - - -  PHQ-9 Score - 9 - - -  Some recent data might be hidden   ALLERGIES: Allergies  Allergen Reactions  . Nsaids Other (See Comments)    H/o gastric bypass - avoid NSAIDs  . Tape Hives and Rash    Adhesives in bandaids    MEDICATIONS: Current Outpatient Prescriptions on File Prior to Visit  Medication Sig Dispense Refill  . acetaminophen (TYLENOL) 325 MG tablet Take 650 mg by mouth 2 (two) times daily as needed for moderate pain or headache. Reported on 04/17/2015    . buPROPion (WELLBUTRIN SR) 200 MG 12 hr tablet Take 1 tablet (200 mg total) by mouth daily at 12 noon. 30 tablet 0  . calcium citrate-vitamin D (CITRACAL+D) 315-200 MG-UNIT tablet Take 1 tablet by mouth 3 (three) times daily.    . cholecalciferol (VITAMIN D) 1000 units tablet Take 1,000 Units by mouth daily.    . diphenhydrAMINE (BENADRYL) 25 MG tablet Take 25 mg by  mouth daily as needed for allergies or sleep. Reported on 03/06/2015    . glucose blood (ONETOUCH VERIO) test strip 1 each by Other route as needed for other. Reported on 03/06/2015    . hyaluronate sodium (RADIAPLEXRX) GEL Apply 1 application topically 2 (two) times daily.    . hydrochlorothiazide (HYDRODIURIL) 25 MG tablet TAKE 1 TABLET BY MOUTH EVERY DAY 30 tablet 11  . Insulin Glargine (BASAGLAR KWIKPEN) 100 UNIT/ML SOPN Inject 0.25 mLs (25 Units total) into the skin at bedtime. 15 pen 0  . losartan (COZAAR) 100 MG tablet Take 1 tablet by mouth daily. Reported on 03/06/2015    . Melatonin 5 MG TABS Take 5 mg by mouth at bedtime. Reported on 03/06/2015    . metFORMIN  (GLUCOPHAGE) 500 MG tablet Take 1 tablet (500 mg total) by mouth daily. 30 tablet 0  . mometasone (NASONEX) 50 MCG/ACT nasal spray Place 1-2 sprays into the nose daily as needed (for allergies.).     [name] Pediatric Multivitamins-Iron (FLINTSTONES PLUS IRON) chewable tablet Chew 1 tablet by mouth 2 (two) times daily.    . Probiotic Product ([employer] PROBIOTIC PO) Take 1 capsule by mouth daily. Reported on 03/06/2015    . Propylene Glycol (SYSTANE BALANCE OP) Apply 1-2 drops to eye 3 (three) times daily.    [name] senna-docusate (SENNA PLUS) 8.6-50 MG tablet Take 2 tablets by mouth at bedtime.    . vortioxetine HBr (TRINTELLIX) 20 MG TABS Take 20 mg by mouth at bedtime. 30 tablet 0   No current facility-administered medications on file prior to visit.     PAST MEDICAL HISTORY: Past Medical History:  Diagnosis Date  . Anemia    hx of - during 1st round chemo  . Anxiety   . Arthritis    in fingers, shoulders  . Back pain   . Balance problem   . Breast cancer ([place])   . Breast cancer of upper-outer quadrant of right female breast ([place]) 07/22/2014  . Bunion, right foot   . Clumsiness   . Constipation   . Depression   . Diabetes mellitus without complication ([place])    26+ years  . Eating disorder    binge eating  . Epistaxis 08/26/2014  . Fatty liver   . HCAP (healthcare-associated pneumonia) 12/18/2014  . Headache   . Heart murmur    never had any problems  . Hyperlipidemia   . Hypertension   . Joint pain   . Multiple food allergies   . Neuromuscular disorder ([place])    diabetic neuropathy  . Obesity   . Obstructive sleep apnea on CPAP    uses cpap nightly  . Ovarian cyst rupture    possible  . Pneumonia   . Pneumonia 11/2014  . Radiation 04/05/15-05/19/15   right chest wall 50.4 Gy, mastectomy scar 10 Gy  . Sleep apnea   . Thyroid nodule 06/2014    PAST SURGICAL HISTORY: Past Surgical History:  Procedure Laterality Date  . APPENDECTOMY    . BIOPSY THYROID Bilateral 06/2014   2 nodules -  Benign   . BREAST LUMPECTOMY WITH RADIOACTIVE SEED AND SENTINEL LYMPH NODE BIOPSY Right 12/27/2014   Procedure: BREAST LUMPECTOMY WITH RADIOACTIVE SEED AND SENTINEL LYMPH NODE BIOPSY;  Surgeon: Stark Klein, MD;  Location: [facility];  Service: General;  Laterality: Right;  . BREAST REDUCTION SURGERY Bilateral 01/06/2015   Procedure: BILATERAL BREAST REDUCTION, RIGHT ONCOPLASTIC RECONSTRUCTION),LEFT BREAST REDUCTION FOR SYMMETRY;  Surgeon: [name], MD;  [facility]  OR;  Service: Clinical cytogeneticist;  Laterality: Bilateral;  . BREATH TEK H PYLORI N/A 09/15/2012   Procedure: BREATH TEK H PYLORI;  Surgeon: Pedro Earls, MD;  Location: [facility];  Service: General;  Laterality: N/A;  . BUNIONECTOMY Left 12/2013  . CARPAL TUNNEL RELEASE Right   . CARPAL TUNNEL RELEASE Left   . COLONOSCOPY    . DUPUYTREN CONTRACTURE RELEASE    . GASTRIC ROUX-EN-Y N/A 11/02/2012   Procedure: LAPAROSCOPIC ROUX-EN-Y GASTRIC;  Surgeon: Pedro Earls, MD;  Location: [facility];  Service: General;  Laterality: N/A;  . IR GENERIC HISTORICAL  03/18/2016   IR US GUIDE VASC ACCESS LEFT 03/18/2016 Markus Daft, MD [facility]  . IR GENERIC HISTORICAL  03/18/2016   IR FLUORO GUIDE CV LINE LEFT 03/18/2016 Markus Daft, MD [facility]  . LAPAROSCOPIC APPENDECTOMY N/A 07/22/2015   Procedure: APPENDECTOMY LAPAROSCOPIC;  Surgeon: Michael Boston, MD;  Location: [facility];  Service: General;  Laterality: N/A;  . MASTECTOMY Right 02/15/2015   modified  . MODIFIED MASTECTOMY Right 02/15/2015   Procedure: RIGHT MODIFIED RADICAL MASTECTOMY;  Surgeon: Stark Klein, MD;  Location: [place];  Service: General;  Laterality: Right;  . PORT-A-CATH REMOVAL Left 10/10/2015   Procedure: PORT REMOVAL;  Surgeon: Stark Klein, MD;  Location: [place];  Service: General;  Laterality: Left;  . PORTACATH PLACEMENT Left 08/02/2014   Procedure: INSERTION PORT-A-CATH;  Surgeon: Stark Klein, MD;  Location: [place];  Service: General;   Laterality: Left;  . PORTACATH PLACEMENT N/A 11/05/2016   Procedure: INSERTION PORT-A-CATH;  Surgeon: Stark Klein, MD;  Location: [place];  Service: General;  Laterality: N/A;  . ROTATOR CUFF REPAIR Right 2005  . SCAR REVISION Right 12/08/2015   Procedure: COMPLEX REPAIR RIGHT CHEST 10-15CM;  Surgeon: [name], MD;  Location: [place];  Service: Plastics;  Laterality: Right;  . TOE SURGERY  1970s   bone spur  . TRIGGER FINGER RELEASE     x3    SOCIAL HISTORY: Social History  Substance Use Topics  . Smoking status: Never Smoker  . Smokeless tobacco: Never Used  . Alcohol use 0.0 oz/week     Comment: social    FAMILY HISTORY: Family History  Problem Relation Age of Onset  . CVA Mother   . Heart disease Mother   . Sudden death Mother   . Diabetes Father   . Heart failure Father   . Hypertension Father   . Kidney disease Father   . Obesity Father   . Hypertension Sister   . Diabetes Brother   . Breast cancer Paternal Grandmother   . Diabetes Paternal Uncle     ROS: Review of Systems  Constitutional: Positive for weight loss.  Cardiovascular: Negative for chest pain.  Endo/Heme/Allergies:       Negative hypogycemia  Psychiatric/Behavioral: Positive for depression. Negative for suicidal ideas.    PHYSICAL EXAM: Blood pressure 120/71, pulse (!) 54, temperature 97.8 F (36.6 C), height 5\' 9"  (1.753 m), weight 219 lb (99.3 kg), last menstrual period 10/22/2007, SpO2 99 %. Body mass index is 32.34 kg/m. Physical Exam  Constitutional: She is oriented to person, place, and time. She appears well-developed and well-nourished.  Cardiovascular: Normal rate.   Pulmonary/Chest: Effort normal.  Musculoskeletal: Normal range of motion.  Neurological: She is oriented to person, place, and time.  Skin: Skin is warm and dry.  Psychiatric: She has a normal mood and affect.  Vitals reviewed.   RECENT LABS AND TESTS: BMET  Component Value Date/Time     NA 140 11/08/2016 0913   K 4.6 11/08/2016 0913   CL 107 11/05/2016 0724   CO2 26 11/08/2016 0913   GLUCOSE 186 (H) 11/08/2016 0913   BUN 34.2 (H) 11/08/2016 0913   CREATININE 1.4 (H) 11/08/2016 0913   CALCIUM 9.4 11/08/2016 0913   GFRNONAA 41 (L) 11/05/2016 0724   GFRNONAA 55 (L) 02/18/2013 0827   GFRAA 47 (L) 11/05/2016 0724   GFRAA 63 02/18/2013 0827   Lab Results  Component Value Date   HGBA1C 8.2 (H) 11/05/2016   HGBA1C 8.1 (H) 08/09/2016   HGBA1C 6.5 (H) 01/03/2015   HGBA1C 6.9 (H) 02/18/2013   HGBA1C 7.7 (H) 11/02/2012   Lab Results  Component Value Date   INSULIN 18.3 08/09/2016   CBC    Component Value Date/Time   WBC 4.8 11/08/2016 0913   WBC 4.7 11/05/2016 0724   RBC 3.53 (L) 11/08/2016 0913   RBC 3.87 11/05/2016 0724   HGB 10.7 (L) 11/08/2016 0913   HCT 32.0 (L) 11/08/2016 0913   PLT 97 (L) 11/08/2016 0913   MCV 90.7 11/08/2016 0913   MCH 30.3 11/08/2016 0913   MCH 29.5 11/05/2016 0724   MCHC 33.4 11/08/2016 0913   MCHC 33.1 11/05/2016 0724   RDW 14.7 (H) 11/08/2016 0913   LYMPHSABS 1.2 11/08/2016 0913   MONOABS 0.4 11/08/2016 0913   EOSABS 0.1 11/08/2016 0913   EOSABS 0.1 08/09/2016 1229   BASOSABS 0.0 11/08/2016 0913   Iron/TIBC/Ferritin/ %Sat    Component Value Date/Time   IRON 58 02/18/2013 0827   TIBC 305 02/18/2013 0827   FERRITIN 133 10/31/2014 0816   IRONPCTSAT 19 (L) 02/18/2013 0827   Lipid Panel     Component Value Date/Time   CHOL 182 08/09/2016 1229   TRIG 176 (H) 08/09/2016 1229   HDL 62 08/09/2016 1229   CHOLHDL 2.9 02/18/2013 0827   VLDL 23 02/18/2013 0827   LDLCALC 85 08/09/2016 1229   Hepatic Function Panel     Component Value Date/Time   PROT 6.7 11/08/2016 0913   ALBUMIN 3.6 11/08/2016 0913   AST 47 (H) 11/08/2016 0913   ALT 54 11/08/2016 0913   ALKPHOS 114 11/08/2016 0913   BILITOT 0.72 11/08/2016 0913      Component Value Date/Time   TSH 2.170 08/09/2016 1229    ASSESSMENT AND PLAN: Type 2 diabetes  mellitus without complication, with long-term current use of insulin ([place]) - Plan: liraglutide (VICTOZA) 18 MG/3ML SOPN, Insulin Pen Needle (BD PEN NEEDLE NANO U/F) 32G X 4 MM MISC  Other depression - with emotional eating  At risk for heart disease  Class 1 obesity with serious comorbidity and body mass index (BMI) of 32.0 to 32.9 in adult, unspecified obesity type  PLAN:  Diabetes II Darlene [name] has been given extensive diabetes education by myself today including ideal fasting and post-prandial blood glucose readings, individual ideal Hgb A1c goals  and hypoglycemia prevention. We discussed the importance of good blood sugar control to decrease the likelihood of diabetic complications such as nephropathy, neuropathy, limb loss, blindness, coronary artery disease, and death. We discussed the importance of intensive lifestyle modification including diet, exercise and weight loss as the first line treatment for diabetes. Darlene [name] agrees to start Victoza 0.6 mg q AM #1 pen and nanoneedles #100 and continue her insulin and metformin. Darlene [name] agrees to follow up with our clinic in 1 and a half weeks.  Cardiovascular risk counselling Darlene [name] was given extended (15  minutes) coronary artery disease prevention counseling today. She is 66 y.o. female and has risk factors for heart disease including obesity. We discussed intensive lifestyle modifications today with an emphasis on specific weight loss instructions and strategies. Pt was also informed of the importance of increasing exercise and decreasing saturated fats to help prevent heart disease.  Depression with Emotional Eating Behaviors We discussed behavior modification techniques today to help Darlene [name] deal with her emotional eating and depression. Darlene [name] agrees to continue taking Wellbutrin SR 200 mg qd and she agrees to follow with our clinic in 1 and a half weeks.  Obesity Darlene [name] is currently in the action stage of change. As such, her goal  is to continue with weight loss efforts She has agreed to keep a food journal with 1300-1500 calories and 80+ grams of protein daily Darlene [name] has been instructed to work up to a goal of 150 minutes of combined cardio and strengthening exercise per week for weight loss and overall health benefits. We discussed the following Behavioral Modification Strategies today: increasing lean protein intake, decreasing simple carbohydrates, travel eating strategies, and keep a strict food journal    Darlene [name] has agreed to follow up with our clinic in 1 and a half weeks. She was informed of the importance of frequent follow up visits to maximize her success with intensive lifestyle modifications for her multiple health conditions.  I, [name], am acting as transcriptionist for Dennard Nip, MD  I have reviewed the above documentation for accuracy and completeness, and I agree with the above. -Dennard Nip, MD      Today's visit was # 6 out of 22.  Starting weight: 225 lbs Starting date: 08/09/16 Today's weight : 219 lbs  Today's date: 11/18/2016 Total lbs lost to date: 6 (Patients must lose 7 lbs in the first 6 months to continue with counseling)   ASK: We discussed the diagnosis of obesity with Darlene [name] today and Darlene [name] agreed to give [name] permission to discuss obesity behavioral modification therapy today.  ASSESS: Darlene [name] has the diagnosis of obesity and her BMI today is 32.33 Darlene [name] is in the action stage of change   ADVISE: Darlene [name] was educated on the multiple health risks of obesity as well as the benefit of weight loss to improve her health. She was advised of the need for long term treatment and the importance of lifestyle modifications.  AGREE: Multiple dietary modification options and treatment options were discussed and  Darlene [name] agreed to keep a food journal with 1300-1500 calories and 80+ grams of protein daily We discussed the following Behavioral  Modification Strategies today: increasing lean protein intake, decreasing simple carbohydrates, travel eating strategies, and keep a strict food journal

## 2016-11-20 ENCOUNTER — Ambulatory Visit (HOSPITAL_BASED_OUTPATIENT_CLINIC_OR_DEPARTMENT_OTHER): Admit: 2016-11-20 | Payer: Commercial Managed Care - PPO | Admitting: General Surgery

## 2016-11-20 ENCOUNTER — Encounter (HOSPITAL_BASED_OUTPATIENT_CLINIC_OR_DEPARTMENT_OTHER): Payer: Self-pay

## 2016-11-20 SURGERY — MODIFIED MASTECTOMY
Anesthesia: General | Site: Breast | Laterality: Right

## 2016-11-26 ENCOUNTER — Ambulatory Visit (HOSPITAL_BASED_OUTPATIENT_CLINIC_OR_DEPARTMENT_OTHER): Payer: Commercial Managed Care - PPO | Admitting: Adult Health

## 2016-11-26 ENCOUNTER — Ambulatory Visit: Payer: Commercial Managed Care - PPO

## 2016-11-26 ENCOUNTER — Ambulatory Visit (HOSPITAL_BASED_OUTPATIENT_CLINIC_OR_DEPARTMENT_OTHER): Payer: Commercial Managed Care - PPO

## 2016-11-26 ENCOUNTER — Other Ambulatory Visit (INDEPENDENT_AMBULATORY_CARE_PROVIDER_SITE_OTHER): Payer: Self-pay | Admitting: Family Medicine

## 2016-11-26 ENCOUNTER — Encounter: Payer: Self-pay | Admitting: Adult Health

## 2016-11-26 ENCOUNTER — Telehealth: Payer: Self-pay | Admitting: Adult Health

## 2016-11-26 ENCOUNTER — Other Ambulatory Visit (HOSPITAL_BASED_OUTPATIENT_CLINIC_OR_DEPARTMENT_OTHER): Payer: Commercial Managed Care - PPO

## 2016-11-26 VITALS — BP 131/48 | HR 80 | Temp 98.2°F | Resp 18 | Ht 69.0 in | Wt 221.4 lb

## 2016-11-26 DIAGNOSIS — Z95828 Presence of other vascular implants and grafts: Secondary | ICD-10-CM

## 2016-11-26 DIAGNOSIS — C773 Secondary and unspecified malignant neoplasm of axilla and upper limb lymph nodes: Secondary | ICD-10-CM

## 2016-11-26 DIAGNOSIS — Z5112 Encounter for antineoplastic immunotherapy: Secondary | ICD-10-CM

## 2016-11-26 DIAGNOSIS — Z171 Estrogen receptor negative status [ER-]: Secondary | ICD-10-CM | POA: Diagnosis not present

## 2016-11-26 DIAGNOSIS — C50411 Malignant neoplasm of upper-outer quadrant of right female breast: Secondary | ICD-10-CM

## 2016-11-26 DIAGNOSIS — C50911 Malignant neoplasm of unspecified site of right female breast: Secondary | ICD-10-CM

## 2016-11-26 DIAGNOSIS — F3289 Other specified depressive episodes: Secondary | ICD-10-CM

## 2016-11-26 LAB — CBC WITH DIFFERENTIAL/PLATELET
BASO%: 0.4 % (ref 0.0–2.0)
Basophils Absolute: 0 10*3/uL (ref 0.0–0.1)
EOS%: 2 % (ref 0.0–7.0)
Eosinophils Absolute: 0.1 10*3/uL (ref 0.0–0.5)
HCT: 32.1 % — ABNORMAL LOW (ref 34.8–46.6)
HGB: 10.9 g/dL — ABNORMAL LOW (ref 11.6–15.9)
LYMPH%: 13 % — ABNORMAL LOW (ref 14.0–49.7)
MCH: 30.3 pg (ref 25.1–34.0)
MCHC: 33.9 g/dL (ref 31.5–36.0)
MCV: 89.4 fL (ref 79.5–101.0)
MONO#: 0.5 10*3/uL (ref 0.1–0.9)
MONO%: 7.6 % (ref 0.0–14.0)
NEUT#: 5 10*3/uL (ref 1.5–6.5)
NEUT%: 77 % — ABNORMAL HIGH (ref 38.4–76.8)
Platelets: 122 10*3/uL — ABNORMAL LOW (ref 145–400)
RBC: 3.59 10*6/uL — ABNORMAL LOW (ref 3.70–5.45)
RDW: 15 % — ABNORMAL HIGH (ref 11.2–14.5)
WBC: 6.4 10*3/uL (ref 3.9–10.3)
lymph#: 0.8 10*3/uL — ABNORMAL LOW (ref 0.9–3.3)

## 2016-11-26 LAB — COMPREHENSIVE METABOLIC PANEL
ALT: 50 U/L (ref 0–55)
AST: 38 U/L — ABNORMAL HIGH (ref 5–34)
Albumin: 3.4 g/dL — ABNORMAL LOW (ref 3.5–5.0)
Alkaline Phosphatase: 123 U/L (ref 40–150)
Anion Gap: 9 mEq/L (ref 3–11)
BUN: 30.5 mg/dL — ABNORMAL HIGH (ref 7.0–26.0)
CO2: 25 mEq/L (ref 22–29)
Calcium: 9.2 mg/dL (ref 8.4–10.4)
Chloride: 107 mEq/L (ref 98–109)
Creatinine: 1.6 mg/dL — ABNORMAL HIGH (ref 0.6–1.1)
EGFR: 34 mL/min/{1.73_m2} — ABNORMAL LOW (ref >60)
Glucose: 228 mg/dl — ABNORMAL HIGH (ref 70–140)
Potassium: 4.1 mEq/L (ref 3.5–5.1)
Sodium: 141 mEq/L (ref 136–145)
Total Bilirubin: 0.92 mg/dL (ref 0.20–1.20)
Total Protein: 6.8 g/dL (ref 6.4–8.3)

## 2016-11-26 MED ORDER — SODIUM CHLORIDE 0.9% FLUSH
10.0000 mL | INTRAVENOUS | Status: DC | PRN
  Administered 2016-11-26: 10 mL
  Filled 2016-11-26: qty 10

## 2016-11-26 MED ORDER — SODIUM CHLORIDE 0.9 % IV SOLN
Freq: Once | INTRAVENOUS | Status: AC
  Administered 2016-11-26: 10:00:00 via INTRAVENOUS

## 2016-11-26 MED ORDER — TRASTUZUMAB CHEMO 150 MG IV SOLR
600.0000 mg | Freq: Once | INTRAVENOUS | Status: AC
  Administered 2016-11-26: 600 mg via INTRAVENOUS
  Filled 2016-11-26: qty 28.57

## 2016-11-26 MED ORDER — DIPHENHYDRAMINE HCL 12.5 MG/5ML PO ELIX
12.5000 mg | ORAL_SOLUTION | Freq: Once | ORAL | Status: AC
  Administered 2016-11-26: 12.5 mg via ORAL
  Filled 2016-11-26: qty 5

## 2016-11-26 MED ORDER — SODIUM CHLORIDE 0.9 % IJ SOLN
10.0000 mL | INTRAMUSCULAR | Status: DC | PRN
  Administered 2016-11-26: 10 mL via INTRAVENOUS
  Filled 2016-11-26: qty 10

## 2016-11-26 MED ORDER — HEPARIN SOD (PORK) LOCK FLUSH 100 UNIT/ML IV SOLN
500.0000 [IU] | Freq: Once | INTRAVENOUS | Status: AC | PRN
  Administered 2016-11-26: 500 [IU]
  Filled 2016-11-26: qty 5

## 2016-11-26 MED ORDER — ACETAMINOPHEN 325 MG PO TABS
650.0000 mg | ORAL_TABLET | Freq: Once | ORAL | Status: AC
  Administered 2016-11-26: 650 mg via ORAL

## 2016-11-26 MED ORDER — ACETAMINOPHEN 325 MG PO TABS
ORAL_TABLET | ORAL | Status: AC
  Filled 2016-11-26: qty 2

## 2016-11-26 NOTE — Progress Notes (Signed)
[place]  [phone] [phone]   ID: ANESSIA OAKLAND DOB: 10-04-1950  MR#: [id number]  FKC#:[id number]  Patient Care Team: Vernie Shanks, MD as PCP - General (Family Medicine) Himmelrich, Bryson Ha, RD (Inactive) as Dietitian (Bariatrics) Stark Klein, MD as Consulting Physician (General Surgery) Magrinat, Virgie Dad, MD as Consulting Physician (Oncology) Arloa Koh, MD as Consulting Physician (Radiation Oncology) Mauro Kaufmann, RN as Registered Nurse Rockwell Germany, RN as Registered Nurse Jacelyn Pi, MD as Consulting Physician (Endocrinology) Kem Boroughs, [place] as Nurse Practitioner (Family Medicine) [name], MD as Consulting Physician (Plastic Surgery) Larey Dresser, MD as Consulting Physician (Cardiology) Gery Pray, MD as Consulting Physician (Radiation Oncology) PCP: Vernie Shanks, MD OTHER MD:  CHIEF COMPLAINT:  HER-2 positive, estrogen receptor negative locally recurrent disease  CURRENT TREATMENT:  Trastuzumab, radiation therapy  INTERVAL HISTORY:  [name] returns today for follow-up of her HER-2/neu positive estrogen receptor negative breast cancer accompanied by her husband. [name] continues on trastuzumab every 3 weeks, which is her maintenance treatment.  She is doing moderately well today.  REVIEW OF SYSTEMS: Jozey is doing moderately well today.  She had a viral URI, and subsequent laryngitis, but is recovering.  She is losing her job.  The company she works for is [employer] for bankruptcy.  She is concerned about her insurance.  She wants to make sure that her PET is in January so she knows it will be covered by her husbands insurance.  She denies chest pain, shortness of breath, new pain anywhere.  Her last echo was in 09/2016 and was normal.  Her next echo is in January to ensure she is insured.Otherwise, a detailed ROS is non contributory.   BREAST CANCER HISTORY: From the original intake  note:  "[name]" had screening mammography at her gynecologist suggestive of a possible abnormality in the right breast. However right diagnostic mammogram 04/12/2013 at [facility] was negative. More recently however, she noted a lump in her right breast and brought it to her gynecologist's attention. On 07/18/2014 the patient had bilateral diagnostic mammography with tomosynthesis at [facility]. The breast density was category B. In the right breast upper outer quadrant there was an area of focal asymmetry with indistinct margins.[name] Ultrasound was obtained and showed in addition to the mass in question, measuring 1.7 cm, an abnormal-appearing lymph node in the right axillary tail.  On 07/19/2014 the patient underwent biopsy of the right breast massin question as well as a right axillary lymph node. Both were positive for invasive ductal carcinoma, grade 2, estrogen and progesterone receptor negative, with an MIB-1 of 90%, and HER-2 pending. Incidentally the lymph node biopsy showed a lymphocytic inflammatory component but no lymph node architecture.  Her subsequent history is as detailed below.  PAST MEDICAL HISTORY: Past Medical History:  Diagnosis Date  . Anemia    hx of - during 1st round chemo  . Anxiety   . Arthritis    in fingers, shoulders  . Back pain   . Balance problem   . Breast cancer ([place])   . Breast cancer of upper-outer quadrant of right female breast ([place]) 07/22/2014  . Bunion, right foot   . Clumsiness   . Constipation   . Depression   . Diabetes mellitus without complication ([place])    74+ years  . Eating disorder    binge eating  . Epistaxis 08/26/2014  . Fatty liver   . HCAP (healthcare-associated pneumonia) 12/18/2014  . Headache   .  Heart murmur    never had any problems  . Hyperlipidemia   . Hypertension   . Joint pain   . Multiple food allergies   . Neuromuscular disorder ([place])    diabetic neuropathy  . Obesity   . Obstructive sleep apnea on CPAP    uses cpap nightly   . Ovarian cyst rupture    possible  . Pneumonia   . Pneumonia 11/2014  . Radiation 04/05/15-05/19/15   right chest wall 50.4 Gy, mastectomy scar 10 Gy  . Sleep apnea   . Thyroid nodule 06/2014    PAST SURGICAL HISTORY: Past Surgical History:  Procedure Laterality Date  . APPENDECTOMY    . BIOPSY THYROID Bilateral 06/2014   2 nodules - Benign   . BUNIONECTOMY Left 12/2013  . CARPAL TUNNEL RELEASE Right   . CARPAL TUNNEL RELEASE Left   . COLONOSCOPY    . DUPUYTREN CONTRACTURE RELEASE    . IR GENERIC HISTORICAL  03/18/2016   IR US GUIDE VASC ACCESS LEFT 03/18/2016 Markus Daft, MD [facility]INTER[facility]  . IR GENERIC HISTORICAL  03/18/2016   IR FLUORO GUIDE CV LINE LEFT 03/18/2016 Markus Daft, MD [facility]  . MASTECTOMY Right 02/15/2015   modified  . ROTATOR CUFF REPAIR Right 2005  . TOE SURGERY  1970s   bone spur  . TRIGGER FINGER RELEASE     x3    FAMILY HISTORY Family History  Problem Relation Age of Onset  . CVA Mother   . Heart disease Mother   . Sudden death Mother   . Diabetes Father   . Heart failure Father   . Hypertension Father   . Kidney disease Father   . Obesity Father   . Hypertension Sister   . Diabetes Brother   . Breast cancer Paternal Grandmother   . Diabetes Paternal Uncle    the patient's father died from complications of diabetes at age 44. The patient's mother died at age 21 with a subarachnoid hemorrhage. The patient had one brother, one sister. The only breast cancer was the patient's paternal grandmother diagnosed in her 57s. There is no history of ovarian cancer in the family.  GYNECOLOGIC HISTORY:  Patient's last menstrual period was 10/22/2007.  menarche age 48, the patient is GX P0. She went through the change of life in 2011. She did not take hormone replacement.  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 Her husband [name] is a retired Visual merchandiser (he taught at  [employer]).  [name] has 2 children from an earlier marriage, Rockey Situ who teaches in [place], and Ane Conerly,  who works at the [employer] in in [place]. He has 1 grand son, aged 71. The patient and her husband are not church attender's    ADVANCED DIRECTIVES:  Not in place   HEALTH MAINTENANCE: Social History   Tobacco Use  . Smoking status: Never Smoker  . Smokeless tobacco: Never Used  Substance Use Topics  . Alcohol use: Yes    Alcohol/week: 0.0 oz    Comment: social  . Drug use: No     Colonoscopy:  May 2016  PAP:  Bone density: 07/05/2013 normal, with a T score of 0.4  Lipid panel:  Allergies  Allergen Reactions  . Nsaids Other (See Comments)    H/o gastric bypass - avoid NSAIDs  . Tape Hives and Rash    Adhesives in bandaids    Current  Outpatient Medications  Medication Sig Dispense Refill  . acetaminophen (TYLENOL) 325 MG tablet Take 650 mg by mouth 2 (two) times daily as needed for moderate pain or headache. Reported on 04/17/2015    . buPROPion (WELLBUTRIN SR) 200 MG 12 hr tablet Take 1 tablet (200 mg total) by mouth daily at 12 noon. 30 tablet 0  . calcium citrate-vitamin D (CITRACAL+D) 315-200 MG-UNIT tablet Take 1 tablet by mouth 3 (three) times daily.    . cholecalciferol (VITAMIN D) 1000 units tablet Take 1,000 Units by mouth daily.    . diphenhydrAMINE (BENADRYL) 25 MG tablet Take 25 mg by mouth daily as needed for allergies or sleep. Reported on 03/06/2015    . glucose blood (ONETOUCH VERIO) test strip 1 each by Other route as needed for other. Reported on 03/06/2015    . hydrochlorothiazide (HYDRODIURIL) 25 MG tablet TAKE 1 TABLET BY MOUTH EVERY DAY 30 tablet 11  . Insulin Glargine (BASAGLAR KWIKPEN) 100 UNIT/ML SOPN Inject 0.25 mLs (25 Units total) into the skin at bedtime. 15 pen 0  . Insulin Pen Needle (BD PEN NEEDLE NANO U/F) 32G X 4 MM MISC 1 Package by Does not apply route daily at 12 noon. 100 each 0  . liraglutide (VICTOZA) 18 MG/3ML  SOPN Inject 0.1 mLs (0.6 mg total) into the skin every morning. 1 pen 0  . losartan (COZAAR) 100 MG tablet Take 1 tablet by mouth daily. Reported on 03/06/2015    . Melatonin 5 MG TABS Take 5 mg by mouth at bedtime. Reported on 03/06/2015    . metFORMIN (GLUCOPHAGE) 500 MG tablet Take 1 tablet (500 mg total) by mouth daily. 30 tablet 0  . mometasone (NASONEX) 50 MCG/ACT nasal spray Place 1-2 sprays into the nose daily as needed (for allergies.).     [name] Pediatric Multivitamins-Iron (FLINTSTONES PLUS IRON) chewable tablet Chew 1 tablet by mouth 2 (two) times daily.    . Probiotic Product ([employer]S PROBIOTIC PO) Take 1 capsule by mouth daily. Reported on 03/06/2015    . Propylene Glycol (SYSTANE BALANCE OP) Apply 1-2 drops to eye 3 (three) times daily.    [name] senna-docusate (SENNA PLUS) 8.6-50 MG tablet Take 2 tablets by mouth at bedtime.    . vortioxetine HBr (TRINTELLIX) 20 MG TABS Take 20 mg by mouth at bedtime. 30 tablet 0   No current facility-administered medications for this visit.     OBJECTIVE:   Vitals:   11/26/16 0847  BP: (!) 131/48  Pulse: 80  Resp: 18  Temp: 98.2 F (36.8 C)  SpO2: 96%     Body mass index is 32.7 kg/m.    ECOG FS:1 - Symptomatic but completely ambulatory Filed Weights   11/26/16 0847  Weight: 221 lb 6.4 oz (100.4 kg)   GENERAL: Patient is a well appearing female in no acute distress HEENT:  Sclerae anicteric.  Oropharynx clear and moist. No ulcerations or evidence of oropharyngeal candidiasis. Neck is supple.  NODES:  No cervical, supraclavicular, or axillary lymphadenopathy palpated.  BREAST EXAM:  Deferred. LUNGS:  Clear to auscultation bilaterally.  No wheezes or rhonchi. HEART:  Regular rate and rhythm. No murmur appreciated. ABDOMEN:  Soft, nontender.  Positive, normoactive bowel sounds. No organomegaly palpated. MSK:  No focal spinal tenderness to palpation. Full range of motion bilaterally in the upper extremities. EXTREMITIES:  No peripheral edema.     SKIN:  Clear with no obvious rashes or skin changes. No nail dyscrasia. NEURO:  Nonfocal. Well oriented.  Appropriate  affect.   LAB RESULTS:  CMP     Component Value Date/Time   NA 141 11/26/2016 0806   K 4.1 11/26/2016 0806   CL 107 11/05/2016 0724   CO2 25 11/26/2016 0806   GLUCOSE 228 (H) 11/26/2016 0806   BUN 30.5 (H) 11/26/2016 0806   CREATININE 1.6 (H) 11/26/2016 0806   CALCIUM 9.2 11/26/2016 0806   PROT 6.8 11/26/2016 0806   ALBUMIN 3.4 (L) 11/26/2016 0806   AST 38 (H) 11/26/2016 0806   ALT 50 11/26/2016 0806   ALKPHOS 123 11/26/2016 0806   BILITOT 0.92 11/26/2016 0806   GFRNONAA 41 (L) 11/05/2016 0724   GFRNONAA 55 (L) 02/18/2013 0827   GFRAA 47 (L) 11/05/2016 0724   GFRAA 63 02/18/2013 0827    INo results found for: SPEP, UPEP  Lab Results  Component Value Date   WBC 6.4 11/26/2016   NEUTROABS 5.0 11/26/2016   HGB 10.9 (L) 11/26/2016   HCT 32.1 (L) 11/26/2016   MCV 89.4 11/26/2016   PLT 122 (L) 11/26/2016      Chemistry      Component Value Date/Time   NA 141 11/26/2016 0806   K 4.1 11/26/2016 0806   CL 107 11/05/2016 0724   CO2 25 11/26/2016 0806   BUN 30.5 (H) 11/26/2016 0806   CREATININE 1.6 (H) 11/26/2016 0806      Component Value Date/Time   CALCIUM 9.2 11/26/2016 0806   ALKPHOS 123 11/26/2016 0806   AST 38 (H) 11/26/2016 0806   ALT 50 11/26/2016 0806   BILITOT 0.92 11/26/2016 0806       No results found for: LABCA2  No components found for: GUYQI347  No results for input(s): INR in the last 168 hours.  Urinalysis    Component Value Date/Time   COLORURINE YELLOW 07/21/2015 [place] 07/21/2015 2328   LABSPEC 1.020 07/21/2015 2328   PHURINE 6.0 07/21/2015 2328   GLUCOSEU NEGATIVE 07/21/2015 2328   HGBUR TRACE (A) 07/21/2015 2328   BILIRUBINUR NEGATIVE 07/21/2015 2328   BILIRUBINUR neg 06/29/2013 [place] 07/21/2015 2328   PROTEINUR 30 (A) 07/21/2015 2328   UROBILINOGEN negative 06/29/2013  1017   NITRITE NEGATIVE 07/21/2015 2328   LEUKOCYTESUR NEGATIVE 07/21/2015 2328    STUDIES: Dg Chest Port 1 View  Result Date: 11/05/2016 CLINICAL DATA:  Status post Port-A-Cath placement via the left subclavian approach. EXAM: PORTABLE CHEST 1 VIEW COMPARISON:  Chest x-ray of December 18, 2014. FINDINGS: The porta catheter tip projects over the midportion of the SVC. The reservoir lies over the upper outer left pectoral region. There is no postprocedure pneumothorax. The lungs are clear. The heart and pulmonary vascularity are normal. There is calcification in the wall of the aortic arch. There is mild multilevel degenerative disc disease of the thoracic spine. IMPRESSION: There is no postprocedure complication following Port-A-Cath placement. Electronically Signed   By: David  Martinique M.D.   On: 11/05/2016 11:45   Dg Fluoro Guide Cv Line-no Report  Result Date: 11/05/2016 Fluoroscopy was utilized by the requesting physician.  No radiographic interpretation.    ASSESSMENT: 66 y.o. [place] woman status post right breast upper outer quadrant and right axillary lymph node biopsy 07/19/2014 for a cT2  pN1, stage IIB  invasive ductal carcinoma, grade 2, estrogen and progesterone receptor negative, with an MIB-1 of 90%, and HER-2 amplified  (1) neoadjuvant chemotherapy started 08/08/2014, consisting of carboplatin, docetaxel, trastuzumab and pertuzumab given every 3 weeks 6, completed 11/21/2014  (a)  breast MRI after initial 3 cycles shows a significant response  (b) breast MRI after 6 cycles shows a complete radiologic response   (2) trastuzumab continued through 10/02/2015 (to end of capecitabine therapy)  (a) echo 02/13/2015 shows an EF of 60-65%  (b)  Echo 05/09/2015 shows an ejection fraction of 60-65%  (c) echocardiogram 06/27/2015 shows an ejection fraction of 55-60%.  (3) right lumpectomy with sentinel lymph node biopsy on 12/27/14 showed a residual  ypT2 ypN1a, stage IIB invasive  ductal carcinoma, grade 2, with repeat estrogen and progesterone receptors again negative  (4) status post right oncoplastic surgery (with left reduction mammoplasty) 01/06/2015 showing in the right breast multiple foci of intralymphatic carcinoma in random sections  (5)  Right modified radical mastectomy 02/15/2014 showed no residual invasive disease; there was DCIS but margins were negative; all 15 axillary lymph nodes were clear; ypTis ypN0  (a)  The patient has decided against reconstruction.  (6)  Postmastectomy radiation completed 05/18/2015 with capecitabine sensitization  (7) adjuvant capecitabine continued through 09/27/2015  (8) hepatic steatosis with increased fibrosis score (at risk for cirrhosis)  (9) thyroid biopsy (left lobe inferiorly) 2 shows benign tissue 06/28/2015  (10) LOCAL RECURRENCE:  (a) PET scan 12/06/2015 Notes a right retropectoral lymph node measuring 0.9 cm with an SUV max of 5.5  (b) repeat PET scan 03/11/2016 finds the right subpectoral lymph node to now measure 1.3 cm with an SUV max of 11.8.  (c) PET scan 05/07/2016 shows a significant response to T-DM 1  (11) started T-DM1 03/21/2016, completed 8 doses 08/13/2016  (a) PET scan 08/19/2016 shows a good response but measurable residual disease  (12) maintenance trastuzumab started 09/03/2016  (a) repeat echocardiogram 07/08/2016 shows an ejection fraction of 60-65%.  (b) echocardiogram on 10/08/2016 demonstrates LVEF of 55-60% (followed by Dr. Haroldine Laws)  (13)  radiation to the area of persistent disease in the right upper chest completed 10/30/2016  PLAN:  Samyiah is doing well today.  Her labs remain stable and I reviewed them with her in detail.  She will proceed with Trastuzumab today.  She will return every 3 weeks for labs/Trastuzumab.  She will see Dr. Jana Hakim on 01/28/2017, with PET scan 2-4 days prior.  She is having several issues with her bill and tells me that her insurance isn't hearing back  from the billing department and coding.  She is wanting to speak to someone about this today.    She knows to call for any problems that may develop before the next visit  A total of (30) minutes of face-to-face time was spent with this patient with greater than 50% of that time in counseling and care-coordination.  Scot Dock, NP   11/26/2016 9:08 AM

## 2016-11-26 NOTE — Telephone Encounter (Signed)
No 1/16 los.  

## 2016-11-26 NOTE — Patient Instructions (Signed)
[name] Discharge Instructions for Patients Receiving Chemotherapy  Today you received the following chemotherapy agent: Herceptin.  To help prevent nausea and vomiting after your treatment, we encourage you to take your nausea medication as prescribed.   If you develop nausea and vomiting that is not controlled by your nausea medication, call the clinic.   BELOW ARE SYMPTOMS THAT SHOULD BE REPORTED IMMEDIATELY:  *FEVER GREATER THAN 100.5 F  *CHILLS WITH OR WITHOUT FEVER  NAUSEA AND VOMITING THAT IS NOT CONTROLLED WITH YOUR NAUSEA MEDICATION  *UNUSUAL SHORTNESS OF BREATH  *UNUSUAL BRUISING OR BLEEDING  TENDERNESS IN MOUTH AND THROAT WITH OR WITHOUT PRESENCE OF ULCERS  *URINARY PROBLEMS  *BOWEL PROBLEMS  UNUSUAL RASH Items with * indicate a potential emergency and should be followed up as soon as possible.  Feel free to call the clinic should you have any questions or concerns. The clinic phone number is [phone].  Please show the [place] at check-in to the Emergency Department and triage nurse.

## 2016-11-28 ENCOUNTER — Ambulatory Visit (INDEPENDENT_AMBULATORY_CARE_PROVIDER_SITE_OTHER): Payer: Commercial Managed Care - PPO | Admitting: Family Medicine

## 2016-11-28 VITALS — BP 123/74 | HR 71 | Temp 98.4°F | Ht 69.0 in | Wt 219.0 lb

## 2016-11-28 DIAGNOSIS — E119 Type 2 diabetes mellitus without complications: Secondary | ICD-10-CM | POA: Diagnosis not present

## 2016-11-28 DIAGNOSIS — Z9189 Other specified personal risk factors, not elsewhere classified: Secondary | ICD-10-CM | POA: Diagnosis not present

## 2016-11-28 DIAGNOSIS — F3289 Other specified depressive episodes: Secondary | ICD-10-CM | POA: Diagnosis not present

## 2016-11-28 DIAGNOSIS — E669 Obesity, unspecified: Secondary | ICD-10-CM | POA: Diagnosis not present

## 2016-11-28 DIAGNOSIS — Z794 Long term (current) use of insulin: Secondary | ICD-10-CM

## 2016-11-28 DIAGNOSIS — Z6832 Body mass index (BMI) 32.0-32.9, adult: Secondary | ICD-10-CM | POA: Diagnosis not present

## 2016-11-28 MED ORDER — BUPROPION HCL ER (SR) 200 MG PO TB12: 200.0000 mg | ORAL_TABLET | Freq: Every day | ORAL | 0 refills | Status: DC

## 2016-11-28 NOTE — Progress Notes (Signed)
Office: [phone]  /  Fax: [phone]   HPI:   Chief Complaint: OBESITY Darlene [name] is here to discuss her progress with her obesity treatment plan. She is on the keep a food journal with 1300-1500 calories and 80+ grams of protein daily and is following her eating plan approximately 50 % of the time. She states she is exercising 0 minutes 0 times per week. Alveda continues to maintain weight well. She is journaling on and off but still making better food choices and she is much more mindful of her eating than she used to be. Her weight is 219 lb (99.3 kg) today and has not lost weight since her last visit. She has lost 6 lbs since starting treatment with [name].  Diabetes II Tyrina has a diagnosis of diabetes type II. Aino's fasting BGs range between 77-116 on approximately 32 units of insulin. She is on low dose Victoza at 0.6 and she is not checking BGs after meals. She denies any hypoglycemic episodes. Last A1c was 8.2 on 11/05/16. She has been working on intensive lifestyle modifications including diet, exercise, and weight loss to help control her blood glucose levels.  At risk for cardiovascular disease Tierria is at a higher than average risk for cardiovascular disease due to obesity and diabetes II. She currently denies any chest pain.  Depression with emotional eating behaviors Edilia's mood is stable, she is still dealing with a lot of stress with her job ending, her cancer diagnosis, and insurance coverage, etc but she feels more hopeful. She has been working on behavior modification techniques to help reduce her emotional eating and has been somewhat successful. She shows no sign of suicidal or homicidal ideations.  Depression screen [facility] 2/9 09/12/2016 08/09/2016 01/01/2016 11/15/2015 10/12/2015  Decreased Interest 0 1 0 0 0  Down, Depressed, Hopeless 0 1 0 0 0  PHQ - 2 Score 0 2 0 0 0  Altered sleeping - 0 - - -  Tired, decreased energy - 1 - - -  Change in appetite -  2 - - -  Feeling bad or failure about yourself  - 2 - - -  Trouble concentrating - 1 - - -  Moving slowly or fidgety/restless - 1 - - -  Suicidal thoughts - 0 - - -  PHQ-9 Score - 9 - - -  Some recent data might be hidden   ALLERGIES: Allergies  Allergen Reactions  . Nsaids Other (See Comments)    H/o gastric bypass - avoid NSAIDs  . Tape Hives and Rash    Adhesives in bandaids    MEDICATIONS: Current Outpatient Medications on File Prior to Visit  Medication Sig Dispense Refill  . acetaminophen (TYLENOL) 325 MG tablet Take 650 mg by mouth 2 (two) times daily as needed for moderate pain or headache. Reported on 04/17/2015    . calcium citrate-vitamin D (CITRACAL+D) 315-200 MG-UNIT tablet Take 1 tablet by mouth 3 (three) times daily.    . cholecalciferol (VITAMIN D) 1000 units tablet Take 1,000 Units by mouth daily.    . diphenhydrAMINE (BENADRYL) 25 MG tablet Take 25 mg by mouth daily as needed for allergies or sleep. Reported on 03/06/2015    . glucose blood (ONETOUCH VERIO) test strip 1 each by Other route as needed for other. Reported on 03/06/2015    . hydrochlorothiazide (HYDRODIURIL) 25 MG tablet TAKE 1 TABLET BY MOUTH EVERY DAY 30 tablet 11  . Insulin Glargine (BASAGLAR KWIKPEN) 100 UNIT/ML SOPN Inject 0.25 mLs (25 Units  total) into the skin at bedtime. 15 pen 0  . Insulin Pen Needle (BD PEN NEEDLE NANO U/F) 32G X 4 MM MISC 1 Package by Does not apply route daily at 12 noon. 100 each 0  . liraglutide (VICTOZA) 18 MG/3ML SOPN Inject 0.1 mLs (0.6 mg total) into the skin every morning. 1 pen 0  . losartan (COZAAR) 100 MG tablet Take 1 tablet by mouth daily. Reported on 03/06/2015    . Melatonin 5 MG TABS Take 5 mg by mouth at bedtime. Reported on 03/06/2015    . metFORMIN (GLUCOPHAGE) 500 MG tablet Take 1 tablet (500 mg total) by mouth daily. 30 tablet 0  . mometasone (NASONEX) 50 MCG/ACT nasal spray Place 1-2 sprays into the nose daily as needed (for allergies.).     [name] Pediatric  Multivitamins-Iron (FLINTSTONES PLUS IRON) chewable tablet Chew 1 tablet by mouth 2 (two) times daily.    . Probiotic Product ([employer] PROBIOTIC PO) Take 1 capsule by mouth daily. Reported on 03/06/2015    . Propylene Glycol (SYSTANE BALANCE OP) Apply 1-2 drops to eye 3 (three) times daily.    [name] senna-docusate (SENNA PLUS) 8.6-50 MG tablet Take 2 tablets by mouth at bedtime.    . vortioxetine HBr (TRINTELLIX) 20 MG TABS Take 20 mg by mouth at bedtime. 30 tablet 0   No current facility-administered medications on file prior to visit.     PAST MEDICAL HISTORY: Past Medical History:  Diagnosis Date  . Anemia    hx of - during 1st round chemo  . Anxiety   . Arthritis    in fingers, shoulders  . Back pain   . Balance problem   . Breast cancer ([place])   . Breast cancer of upper-outer quadrant of right female breast ([place]) 07/22/2014  . Bunion, right foot   . Clumsiness   . Constipation   . Depression   . Diabetes mellitus without complication ([place])    57+ years  . Eating disorder    binge eating  . Epistaxis 08/26/2014  . Fatty liver   . HCAP (healthcare-associated pneumonia) 12/18/2014  . Headache   . Heart murmur    never had any problems  . Hyperlipidemia   . Hypertension   . Joint pain   . Multiple food allergies   . Neuromuscular disorder ([place])    diabetic neuropathy  . Obesity   . Obstructive sleep apnea on CPAP    uses cpap nightly  . Ovarian cyst rupture    possible  . Pneumonia   . Pneumonia 11/2014  . Radiation 04/05/15-05/19/15   right chest wall 50.4 Gy, mastectomy scar 10 Gy  . Sleep apnea   . Thyroid nodule 06/2014    PAST SURGICAL HISTORY: Past Surgical History:  Procedure Laterality Date  . APPENDECTOMY    . BIOPSY THYROID Bilateral 06/2014   2 nodules - Benign   . BUNIONECTOMY Left 12/2013  . CARPAL TUNNEL RELEASE Right   . CARPAL TUNNEL RELEASE Left   . COLONOSCOPY    . DUPUYTREN CONTRACTURE RELEASE    . IR GENERIC HISTORICAL  03/18/2016   IR US GUIDE VASC  ACCESS LEFT 03/18/2016 Markus Daft, MD [facility]-INTER[facility] RAD  . IR GENERIC HISTORICAL  03/18/2016   IR FLUORO GUIDE CV LINE LEFT 03/18/2016 Markus Daft, MD [facility]INTER[facility]  . MASTECTOMY Right 02/15/2015   modified  . ROTATOR CUFF REPAIR Right 2005  . TOE SURGERY  1970s   bone spur  . TRIGGER FINGER RELEASE  x3    SOCIAL HISTORY: Social History   Tobacco Use  . Smoking status: Never Smoker  . Smokeless tobacco: Never Used  Substance Use Topics  . Alcohol use: Yes    Alcohol/week: 0.0 oz    Comment: social  . Drug use: No    FAMILY HISTORY: Family History  Problem Relation Age of Onset  . CVA Mother   . Heart disease Mother   . Sudden death Mother   . Diabetes Father   . Heart failure Father   . Hypertension Father   . Kidney disease Father   . Obesity Father   . Hypertension Sister   . Diabetes Brother   . Breast cancer Paternal Grandmother   . Diabetes Paternal Uncle     ROS: Review of Systems  Constitutional: Negative for weight loss.  Endo/Heme/Allergies:       Negative hypoglycemia  Psychiatric/Behavioral: Positive for depression. Negative for suicidal ideas.    PHYSICAL EXAM: Blood pressure 123/74, pulse 71, temperature 98.4 F (36.9 C), temperature source Oral, height 5\' 9"  (1.753 m), weight 219 lb (99.3 kg), last menstrual period 10/22/2007, SpO2 97 %. Body mass index is 32.34 kg/m. Physical Exam  Constitutional: She is oriented to person, place, and time. She appears well-developed and well-nourished.  Cardiovascular: Normal rate.  Pulmonary/Chest: Effort normal.  Musculoskeletal: Normal range of motion.  Neurological: She is oriented to person, place, and time.  Skin: Skin is warm and dry.  Psychiatric: She has a normal mood and affect.  Vitals reviewed.   RECENT LABS AND TESTS: BMET    Component Value Date/Time   NA 141 11/26/2016 0806   K 4.1 11/26/2016 0806   CL 107 11/05/2016 0724   CO2 25 11/26/2016 0806   GLUCOSE 228 (H) 11/26/2016 0806   BUN  30.5 (H) 11/26/2016 0806   CREATININE 1.6 (H) 11/26/2016 0806   CALCIUM 9.2 11/26/2016 0806   GFRNONAA 41 (L) 11/05/2016 0724   GFRNONAA 55 (L) 02/18/2013 0827   GFRAA 47 (L) 11/05/2016 0724   GFRAA 63 02/18/2013 0827   Lab Results  Component Value Date   HGBA1C 8.2 (H) 11/05/2016   HGBA1C 8.1 (H) 08/09/2016   HGBA1C 6.5 (H) 01/03/2015   HGBA1C 6.9 (H) 02/18/2013   HGBA1C 7.7 (H) 11/02/2012   Lab Results  Component Value Date   INSULIN 18.3 08/09/2016   CBC    Component Value Date/Time   WBC 6.4 11/26/2016 0806   WBC 4.7 11/05/2016 0724   RBC 3.59 (L) 11/26/2016 0806   RBC 3.87 11/05/2016 0724   HGB 10.9 (L) 11/26/2016 0806   HCT 32.1 (L) 11/26/2016 0806   PLT 122 (L) 11/26/2016 0806   MCV 89.4 11/26/2016 0806   MCH 30.3 11/26/2016 0806   MCH 29.5 11/05/2016 0724   MCHC 33.9 11/26/2016 0806   MCHC 33.1 11/05/2016 0724   RDW 15.0 (H) 11/26/2016 0806   LYMPHSABS 0.8 (L) 11/26/2016 0806   MONOABS 0.5 11/26/2016 0806   EOSABS 0.1 11/26/2016 0806   EOSABS 0.1 08/09/2016 1229   BASOSABS 0.0 11/26/2016 0806   Iron/TIBC/Ferritin/ %Sat    Component Value Date/Time   IRON 58 02/18/2013 0827   TIBC 305 02/18/2013 0827   FERRITIN 133 10/31/2014 0816   IRONPCTSAT 19 (L) 02/18/2013 0827   Lipid Panel     Component Value Date/Time   CHOL 182 08/09/2016 1229   TRIG 176 (H) 08/09/2016 1229   HDL 62 08/09/2016 1229   CHOLHDL 2.9 02/18/2013 0827  VLDL 23 02/18/2013 0827   LDLCALC 85 08/09/2016 1229   Hepatic Function Panel     Component Value Date/Time   PROT 6.8 11/26/2016 0806   ALBUMIN 3.4 (L) 11/26/2016 0806   AST 38 (H) 11/26/2016 0806   ALT 50 11/26/2016 0806   ALKPHOS 123 11/26/2016 0806   BILITOT 0.92 11/26/2016 0806      Component Value Date/Time   TSH 2.170 08/09/2016 1229    ASSESSMENT AND PLAN: Type 2 diabetes mellitus without complication, with long-term current use of insulin (HCC)  Other depression - with emotional eating - Plan: buPROPion  (WELLBUTRIN SR) 200 MG 12 hr tablet  At risk for heart disease  Class 1 obesity with serious comorbidity and body mass index (BMI) of 32.0 to 32.9 in adult, unspecified obesity type  PLAN:  Diabetes II Miosotis has been given extensive diabetes education by myself today including ideal fasting and post-prandial blood glucose readings, individual ideal Hgb A1c goals and hypoglycemia prevention. We discussed the importance of good blood sugar control to decrease the likelihood of diabetic complications such as nephropathy, neuropathy, limb loss, blindness, coronary artery disease, and death. We discussed the importance of intensive lifestyle modification including diet, exercise and weight loss as the first line treatment for diabetes. Anaysia agrees to increase Victoza to 0.9, continue diet, and check BGs BID. Hedda agrees to follow up with our clinic in 3 weeks.  Cardiovascular risk counselling Neita was given extended (15 minutes) coronary artery disease prevention counseling today. She is 66 y.o. female and has risk factors for heart disease including obesity and diabetes II. We discussed intensive lifestyle modifications today with an emphasis on specific weight loss instructions and strategies. Pt was also informed of the importance of increasing exercise and decreasing saturated fats to help prevent heart disease.  Depression with Emotional Eating Behaviors We discussed behavior modification techniques today to help Yania deal with her emotional eating and depression. Ocia agrees to continue taking  Wellbutrin SR 200 mg qd and we will refill for 90 days. Alfhild agrees to follow up with our clinic in 3 weeks.  Obesity Lucero is currently in the action stage of change. As such, her goal is to continue with weight loss efforts She has agreed to keep a food journal with 1500 calories and 80 grams of protein daily Chava has been instructed to work up to a goal of 150 minutes  of combined cardio and strengthening exercise per week for weight loss and overall health benefits. We discussed the following Behavioral Modification Strategies today: increasing lean protein intake, decreasing simple carbohydrates, work on meal planning and easy cooking plans, holiday eating strategies, no skipping meals, and travel eating strategies.    Shir has agreed to follow up with our clinic in 3 weeks. She was informed of the importance of frequent follow up visits to maximize her success with intensive lifestyle modifications for her multiple health conditions.  I, [name], am acting as transcriptionist for Dennard Nip, MD  I have reviewed the above documentation for accuracy and completeness, and I agree with the above. -Dennard Nip, MD      Today's visit was # 7 out of 22.  Starting weight: 225 lbs Starting date: 08/09/16 Today's weight : 219 lbs  Today's date: 11/28/2016 Total lbs lost to date: 6 (Patients must lose 7 lbs in the first 6 months to continue with counseling)   ASK: We discussed the diagnosis of obesity with [name] today and [name] agreed  to give [name] permission to discuss obesity behavioral modification therapy today.  ASSESS: Berlinda has the diagnosis of obesity and her BMI today is 32.33 Tumeka is in the action stage of change   ADVISE: Murline was educated on the multiple health risks of obesity as well as the benefit of weight loss to improve her health. She was advised of the need for long term treatment and the importance of lifestyle modifications.  AGREE: Multiple dietary modification options and treatment options were discussed and  Velvia agreed to keep a food journal with 1500 calories and 80 grams of protein daily We discussed the following Behavioral Modification Strategies today: increasing lean protein intake, decreasing simple carbohydrates, work on meal planning and easy cooking plans, holiday eating  strategies, no skipping meals, and travel eating strategies.

## 2016-11-29 ENCOUNTER — Encounter: Payer: Self-pay | Admitting: Oncology

## 2016-12-02 ENCOUNTER — Ambulatory Visit
Admission: RE | Admit: 2016-12-02 | Discharge: 2016-12-02 | Disposition: A | Discharge status: Home / Self Care | Payer: Commercial Managed Care - PPO | Source: Ambulatory Visit | Attending: Radiation Oncology | Admitting: Radiation Oncology

## 2016-12-02 ENCOUNTER — Other Ambulatory Visit: Payer: Self-pay

## 2016-12-02 ENCOUNTER — Encounter: Payer: Self-pay | Admitting: Radiation Oncology

## 2016-12-02 VITALS — BP 139/80 | HR 75 | Temp 98.8°F | Wt 221.8 lb

## 2016-12-02 DIAGNOSIS — Z171 Estrogen receptor negative status [ER-]: Secondary | ICD-10-CM

## 2016-12-02 DIAGNOSIS — Z510 Encounter for antineoplastic radiation therapy: Secondary | ICD-10-CM | POA: Diagnosis not present

## 2016-12-02 DIAGNOSIS — C50411 Malignant neoplasm of upper-outer quadrant of right female breast: Secondary | ICD-10-CM

## 2016-12-02 NOTE — Progress Notes (Signed)
Radiation Oncology         [phone] ________________________________  Name: Darlene [name] MRN: [id number]  Date: 12/02/2016  DOB: [birth year]  Follow-Up Visit Note  CC: Vernie Shanks, MD  Magrinat, Virgie Dad, MD    ICD-10-CM   1. Malignant neoplasm of upper-outer quadrant of right breast in female, estrogen receptor negative ([place]) C50.411    Z17.1     Diagnosis:     ypT2 ypN0, stage IIB invasive ductal carcinoma, grade 2 of the right breast, now recurrent       Interval Since Last Radiation:  1 months   R Axilla, 45 Gy in 25 fractions, area of nodal recurrence    Narrative:  The patient returns today for routine follow-up.  She has had some mild fatigue but attributes this to a head cold. She denies any itching or discomfort in the right upper chest cough or breathing problems. The patient denies any swelling in her right arm or hand or numbness or weakness in her right arm or hand.                             ALLERGIES:  is allergic to nsaids and tape.  Meds: Current Outpatient Medications  Medication Sig Dispense Refill  . acetaminophen (TYLENOL) 325 MG tablet Take 650 mg by mouth 2 (two) times daily as needed for moderate pain or headache. Reported on 04/17/2015    . buPROPion (WELLBUTRIN SR) 200 MG 12 hr tablet Take 1 tablet (200 mg total) daily at 12 noon by mouth. 90 tablet 0  . calcium citrate-vitamin D (CITRACAL+D) 315-200 MG-UNIT tablet Take 1 tablet by mouth 3 (three) times daily.    . cholecalciferol (VITAMIN D) 1000 units tablet Take 1,000 Units by mouth daily.    . diphenhydrAMINE (BENADRYL) 25 MG tablet Take 25 mg by mouth daily as needed for allergies or sleep. Reported on 03/06/2015    . glucose blood (ONETOUCH VERIO) test strip 1 each by Other route as needed for other. Reported on 03/06/2015    . hydrochlorothiazide (HYDRODIURIL) 25 MG tablet TAKE 1 TABLET BY MOUTH EVERY DAY 30 tablet 11  . Insulin Glargine (BASAGLAR KWIKPEN) 100 UNIT/ML SOPN Inject  0.25 mLs (25 Units total) into the skin at bedtime. 15 pen 0  . Insulin Pen Needle (BD PEN NEEDLE NANO U/F) 32G X 4 MM MISC 1 Package by Does not apply route daily at 12 noon. 100 each 0  . liraglutide (VICTOZA) 18 MG/3ML SOPN Inject 0.1 mLs (0.6 mg total) into the skin every morning. 1 pen 0  . losartan (COZAAR) 100 MG tablet Take 1 tablet by mouth daily. Reported on 03/06/2015    . Melatonin 5 MG TABS Take 5 mg by mouth at bedtime. Reported on 03/06/2015    . metFORMIN (GLUCOPHAGE) 500 MG tablet Take 1 tablet (500 mg total) by mouth daily. 30 tablet 0  . mometasone (NASONEX) 50 MCG/ACT nasal spray Place 1-2 sprays into the nose daily as needed (for allergies.).     [name] Pediatric Multivitamins-Iron (FLINTSTONES PLUS IRON) chewable tablet Chew 1 tablet by mouth 2 (two) times daily.    . Probiotic Product ([employer]S PROBIOTIC PO) Take 1 capsule by mouth daily. Reported on 03/06/2015    . Propylene Glycol (SYSTANE BALANCE OP) Apply 1-2 drops to eye 3 (three) times daily.    [name] senna-docusate (SENNA PLUS) 8.6-50 MG tablet Take 2 tablets by mouth at bedtime.    [name]  vortioxetine HBr (TRINTELLIX) 20 MG TABS Take 20 mg by mouth at bedtime. 30 tablet 0   No current facility-administered medications for this encounter.     Physical Findings: The patient is in no acute distress. Patient is alert and oriented.  weight is 221 lb 12.8 oz (100.6 kg). Her oral temperature is 98.8 F (37.1 C). Her blood pressure is 139/80 and her pulse is 75. Her oxygen saturation is 100%. .  Examination of the lungs reveals them to be clear. The heart has a regular rhythm and rate. Examination of the left breast reveals no mass or nipple discharge. Examination of the right chest wall area reveals no palpable or visible signs recurrence of. Careful palpation in the axillary and supraclavicular region reveals no suspicious areas. Mild radiation changes noted in the right upper chest region.  Lab Findings: Lab Results  Component Value Date     WBC 6.4 11/26/2016   HGB 10.9 (L) 11/26/2016   HCT 32.1 (L) 11/26/2016   MCV 89.4 11/26/2016   PLT 122 (L) 11/26/2016    Radiographic Findings: Dg Chest Port 1 View  Result Date: 11/05/2016 CLINICAL DATA:  Status post Port-A-Cath placement via the left subclavian approach. EXAM: PORTABLE CHEST 1 VIEW COMPARISON:  Chest x-ray of December 18, 2014. FINDINGS: The porta catheter tip projects over the midportion of the SVC. The reservoir lies over the upper outer left pectoral region. There is no postprocedure pneumothorax. The lungs are clear. The heart and pulmonary vascularity are normal. There is calcification in the wall of the aortic arch. There is mild multilevel degenerative disc disease of the thoracic spine. IMPRESSION: There is no postprocedure complication following Port-A-Cath placement. Electronically Signed   By: David  Martinique M.D.   On: 11/05/2016 11:45   Dg Fluoro Guide Cv Line-no Report  Result Date: 11/05/2016 Fluoroscopy was utilized by the requesting physician.  No radiographic interpretation.    Impression:  The patient is recovering from the effects of radiation.  No evidence of recurrence on clinical exam today  Plan:  Patient will proceed with a PET scan in December for follow-up of her radiation therapy and rule out new areas of involvement. In light of the patient's close follow-up with Dr. Jana Hakim, I have not scheduled her for formal follow-up appointment but would go ahead see her at any time.  ____________________________________ Gery Pray, MD

## 2016-12-02 NOTE — Progress Notes (Signed)
Darlene [name] is here today for a follow-up for breast cancer of the upper-outer quadrant of the right breast.  Pt denies having any pain. Pt states that she has some mild fatigue. Pt denies having any skin irritation or swelling. Pt denies using the radiaplex gel. BP 139/80 (BP Location: Left Arm)   Pulse 75   Temp 98.8 F (37.1 C) (Oral)   Wt 221 lb 12.8 oz (100.6 kg)   LMP 10/22/2007   SpO2 100%   BMI 32.75 kg/m   Wt Readings from Last 3 Encounters:  12/02/16 221 lb 12.8 oz (100.6 kg)  11/28/16 219 lb (99.3 kg)  11/26/16 221 lb 6.4 oz (100.4 kg)

## 2016-12-17 ENCOUNTER — Other Ambulatory Visit: Payer: Self-pay | Admitting: Oncology

## 2016-12-17 ENCOUNTER — Ambulatory Visit (HOSPITAL_BASED_OUTPATIENT_CLINIC_OR_DEPARTMENT_OTHER): Payer: Commercial Managed Care - PPO

## 2016-12-17 ENCOUNTER — Other Ambulatory Visit (HOSPITAL_BASED_OUTPATIENT_CLINIC_OR_DEPARTMENT_OTHER): Payer: Commercial Managed Care - PPO

## 2016-12-17 ENCOUNTER — Ambulatory Visit: Payer: Commercial Managed Care - PPO

## 2016-12-17 VITALS — BP 129/71 | HR 67 | Temp 98.1°F

## 2016-12-17 DIAGNOSIS — C50911 Malignant neoplasm of unspecified site of right female breast: Secondary | ICD-10-CM

## 2016-12-17 DIAGNOSIS — Z5112 Encounter for antineoplastic immunotherapy: Secondary | ICD-10-CM | POA: Diagnosis not present

## 2016-12-17 DIAGNOSIS — Z95828 Presence of other vascular implants and grafts: Secondary | ICD-10-CM

## 2016-12-17 DIAGNOSIS — C50411 Malignant neoplasm of upper-outer quadrant of right female breast: Secondary | ICD-10-CM

## 2016-12-17 DIAGNOSIS — Z171 Estrogen receptor negative status [ER-]: Secondary | ICD-10-CM

## 2016-12-17 DIAGNOSIS — C773 Secondary and unspecified malignant neoplasm of axilla and upper limb lymph nodes: Principal | ICD-10-CM

## 2016-12-17 LAB — CBC WITH DIFFERENTIAL/PLATELET
BASO%: 0.2 % (ref 0.0–2.0)
Basophils Absolute: 0 10*3/uL (ref 0.0–0.1)
EOS%: 2.8 % (ref 0.0–7.0)
Eosinophils Absolute: 0.2 10*3/uL (ref 0.0–0.5)
HCT: 33.8 % — ABNORMAL LOW (ref 34.8–46.6)
HGB: 11.4 g/dL — ABNORMAL LOW (ref 11.6–15.9)
LYMPH%: 22.3 % (ref 14.0–49.7)
MCH: 30.9 pg (ref 25.1–34.0)
MCHC: 33.7 g/dL (ref 31.5–36.0)
MCV: 91.6 fL (ref 79.5–101.0)
MONO#: 0.4 10*3/uL (ref 0.1–0.9)
MONO%: 5.8 % (ref 0.0–14.0)
NEUT#: 4.4 10*3/uL (ref 1.5–6.5)
NEUT%: 68.9 % (ref 38.4–76.8)
Platelets: 154 10*3/uL (ref 145–400)
RBC: 3.69 10*6/uL — ABNORMAL LOW (ref 3.70–5.45)
RDW: 14.7 % — ABNORMAL HIGH (ref 11.2–14.5)
WBC: 6.3 10*3/uL (ref 3.9–10.3)
lymph#: 1.4 10*3/uL (ref 0.9–3.3)

## 2016-12-17 LAB — COMPREHENSIVE METABOLIC PANEL
ALT: 49 U/L (ref 0–55)
AST: 34 U/L (ref 5–34)
Albumin: 3.6 g/dL (ref 3.5–5.0)
Alkaline Phosphatase: 111 U/L (ref 40–150)
Anion Gap: 9 mEq/L (ref 3–11)
BUN: 47.2 mg/dL — ABNORMAL HIGH (ref 7.0–26.0)
CO2: 21 mEq/L — ABNORMAL LOW (ref 22–29)
Calcium: 9.4 mg/dL (ref 8.4–10.4)
Chloride: 112 mEq/L — ABNORMAL HIGH (ref 98–109)
Creatinine: 1.6 mg/dL — ABNORMAL HIGH (ref 0.6–1.1)
EGFR: 34 mL/min/{1.73_m2} — ABNORMAL LOW (ref >60)
Glucose: 118 mg/dl (ref 70–140)
Potassium: 4.2 mEq/L (ref 3.5–5.1)
Sodium: 142 mEq/L (ref 136–145)
Total Bilirubin: 0.57 mg/dL (ref 0.20–1.20)
Total Protein: 7.2 g/dL (ref 6.4–8.3)

## 2016-12-17 MED ORDER — ACETAMINOPHEN 325 MG PO TABS
650.0000 mg | ORAL_TABLET | Freq: Once | ORAL | Status: AC
  Administered 2016-12-17: 650 mg via ORAL

## 2016-12-17 MED ORDER — ACETAMINOPHEN 325 MG PO TABS
ORAL_TABLET | ORAL | Status: AC
  Filled 2016-12-17: qty 2

## 2016-12-17 MED ORDER — SODIUM CHLORIDE 0.9 % IJ SOLN
10.0000 mL | INTRAMUSCULAR | Status: DC | PRN
  Administered 2016-12-17: 10 mL via INTRAVENOUS
  Filled 2016-12-17: qty 10

## 2016-12-17 MED ORDER — DIPHENHYDRAMINE HCL 12.5 MG/5ML PO ELIX
12.5000 mg | ORAL_SOLUTION | Freq: Once | ORAL | Status: AC
  Administered 2016-12-17: 12.5 mg via ORAL
  Filled 2016-12-17: qty 5

## 2016-12-17 MED ORDER — HEPARIN SOD (PORK) LOCK FLUSH 100 UNIT/ML IV SOLN
500.0000 [IU] | Freq: Once | INTRAVENOUS | Status: AC | PRN
  Administered 2016-12-17: 500 [IU]
  Filled 2016-12-17: qty 5

## 2016-12-17 MED ORDER — SODIUM CHLORIDE 0.9% FLUSH
10.0000 mL | INTRAVENOUS | Status: DC | PRN
  Administered 2016-12-17: 10 mL
  Filled 2016-12-17: qty 10

## 2016-12-17 MED ORDER — SODIUM CHLORIDE 0.9 % IV SOLN
Freq: Once | INTRAVENOUS | Status: AC
  Administered 2016-12-17: 10:00:00 via INTRAVENOUS

## 2016-12-17 MED ORDER — TRASTUZUMAB CHEMO 150 MG IV SOLR
600.0000 mg | Freq: Once | INTRAVENOUS | Status: AC
  Administered 2016-12-17: 600 mg via INTRAVENOUS
  Filled 2016-12-17: qty 28.57

## 2016-12-17 NOTE — Patient Instructions (Signed)
Cancer Center Discharge Instructions for Patients Receiving Chemotherapy Today you received the following chemotherapy agents:  Herceptin To help prevent nausea and vomiting after your treatment, we encourage you to take your nausea medication as prescribed.   If you develop nausea and vomiting that is not controlled by your nausea medication, call the clinic.   BELOW ARE SYMPTOMS THAT SHOULD BE REPORTED IMMEDIATELY:  *FEVER GREATER THAN 100.5 F  *CHILLS WITH OR WITHOUT FEVER  NAUSEA AND VOMITING THAT IS NOT CONTROLLED WITH YOUR NAUSEA MEDICATION  *UNUSUAL SHORTNESS OF BREATH  *UNUSUAL BRUISING OR BLEEDING  TENDERNESS IN MOUTH AND THROAT WITH OR WITHOUT PRESENCE OF ULCERS  *URINARY PROBLEMS  *BOWEL PROBLEMS  UNUSUAL RASH Items with * indicate a potential emergency and should be followed up as soon as possible.  Feel free to call the clinic should you have any questions or concerns. The clinic phone number is [phone].  Please show the CHEMO ALERT CARD at check-in to the Emergency Department and triage nurse.   

## 2016-12-18 ENCOUNTER — Ambulatory Visit (INDEPENDENT_AMBULATORY_CARE_PROVIDER_SITE_OTHER): Payer: Commercial Managed Care - PPO | Admitting: Family Medicine

## 2016-12-18 VITALS — BP 129/75 | HR 63 | Temp 97.6°F | Ht 69.0 in | Wt 216.0 lb

## 2016-12-18 DIAGNOSIS — Z6831 Body mass index (BMI) 31.0-31.9, adult: Secondary | ICD-10-CM | POA: Diagnosis not present

## 2016-12-18 DIAGNOSIS — E119 Type 2 diabetes mellitus without complications: Secondary | ICD-10-CM | POA: Diagnosis not present

## 2016-12-18 DIAGNOSIS — Z6830 Body mass index (BMI) 30.0-30.9, adult: Secondary | ICD-10-CM | POA: Insufficient documentation

## 2016-12-18 DIAGNOSIS — E669 Obesity, unspecified: Secondary | ICD-10-CM | POA: Diagnosis not present

## 2016-12-18 MED ORDER — METFORMIN HCL 500 MG PO TABS: 500.0000 mg | ORAL_TABLET | Freq: Every day | ORAL | 0 refills | Status: DC

## 2016-12-18 NOTE — Progress Notes (Signed)
Office: [phone]  /  Fax: [phone]   HPI:   Chief Complaint: OBESITY Darlene [name] is here to discuss her progress with her obesity treatment plan. She is on the keep a food journal with 1500 calories and 80 grams of protein daily and is following her eating plan approximately 60 % of the time. She states she is exercising 0 minutes 0 times per week. Darlene [name] did well with weight loss, even over Thanksgiving. Her hunger is controlled with Victoza. Darlene [name] did have 1 episode of nausea. Her weight is 216 lb (98 kg) today and has had a weight loss of 3 pounds over a period of 3 weeks since her last visit. She has lost 9 lbs since starting treatment with [name].  Diabetes II Darlene [name] has a diagnosis of diabetes type II. She is on Victoza 0.9 mg, Lantus 32 units and Metformin 500 mg qd Darlene [name] states fasting BGs range between 98 and 120's but she is not checking post prandial BGs often. Darlene [name] denies any hypoglycemic episodes. Last A1c at her endocrinologist was 8.3 She has been working on intensive lifestyle modifications including diet, exercise, and weight loss to help control her blood glucose levels.  ALLERGIES: Allergies  Allergen Reactions  . Nsaids Other (See Comments)    H/o gastric bypass - avoid NSAIDs  . Tape Hives and Rash    Adhesives in bandaids    MEDICATIONS: Current Outpatient Medications on File Prior to Visit  Medication Sig Dispense Refill  . acetaminophen (TYLENOL) 325 MG tablet Take 650 mg by mouth 2 (two) times daily as needed for moderate pain or headache. Reported on 04/17/2015    . buPROPion (WELLBUTRIN SR) 200 MG 12 hr tablet Take 1 tablet (200 mg total) daily at 12 noon by mouth. 90 tablet 0  . calcium citrate-vitamin D (CITRACAL+D) 315-200 MG-UNIT tablet Take 1 tablet by mouth 3 (three) times daily.    . cholecalciferol (VITAMIN D) 1000 units tablet Take 1,000 Units by mouth daily.    . diphenhydrAMINE (BENADRYL) 25 MG tablet Take 25 mg by mouth daily as  needed for allergies or sleep. Reported on 03/06/2015    . glucose blood (ONETOUCH VERIO) test strip 1 each by Other route as needed for other. Reported on 03/06/2015    . hydrochlorothiazide (HYDRODIURIL) 25 MG tablet TAKE 1 TABLET BY MOUTH EVERY DAY 30 tablet 11  . Insulin Glargine (BASAGLAR KWIKPEN) 100 UNIT/ML SOPN Inject 0.25 mLs (25 Units total) into the skin at bedtime. 15 pen 0  . Insulin Pen Needle (BD PEN NEEDLE NANO U/F) 32G X 4 MM MISC 1 Package by Does not apply route daily at 12 noon. 100 each 0  . liraglutide (VICTOZA) 18 MG/3ML SOPN Inject 0.1 mLs (0.6 mg total) into the skin every morning. 1 pen 0  . losartan (COZAAR) 100 MG tablet Take 1 tablet by mouth daily. Reported on 03/06/2015    . Melatonin 5 MG TABS Take 5 mg by mouth at bedtime. Reported on 03/06/2015    . metFORMIN (GLUCOPHAGE) 500 MG tablet Take 1 tablet (500 mg total) by mouth daily. 30 tablet 0  . mometasone (NASONEX) 50 MCG/ACT nasal spray Place 1-2 sprays into the nose daily as needed (for allergies.).     [name] Pediatric Multivitamins-Iron (FLINTSTONES PLUS IRON) chewable tablet Chew 1 tablet by mouth 2 (two) times daily.    . Probiotic Product ([employer] PROBIOTIC PO) Take 1 capsule by mouth daily. Reported on 03/06/2015    . Propylene Glycol (SYSTANE BALANCE  OP) Apply 1-2 drops to eye 3 (three) times daily.    [name] senna-docusate (SENNA PLUS) 8.6-50 MG tablet Take 2 tablets by mouth at bedtime.    . vortioxetine HBr (TRINTELLIX) 20 MG TABS Take 20 mg by mouth at bedtime. 30 tablet 0   No current facility-administered medications on file prior to visit.     PAST MEDICAL HISTORY: Past Medical History:  Diagnosis Date  . Anemia    hx of - during 1st round chemo  . Anxiety   . Arthritis    in fingers, shoulders  . Back pain   . Balance problem   . Breast cancer ([place])   . Breast cancer of upper-outer quadrant of right female breast ([place]) 07/22/2014  . Bunion, right foot   . Clumsiness   . Constipation   . Depression   .  Diabetes mellitus without complication ([place])    96+ years  . Eating disorder    binge eating  . Epistaxis 08/26/2014  . Fatty liver   . HCAP (healthcare-associated pneumonia) 12/18/2014  . Headache   . Heart murmur    never had any problems  . History of radiation therapy 04/05/15-05/19/15   right chest wall was treated to 50.4 Gy in 28 fractions, right mastectomy scar was treated to 10 Gy in 5 fractions  . Hyperlipidemia   . Hypertension   . Joint pain   . Multiple food allergies   . Neuromuscular disorder ([place])    diabetic neuropathy  . Obesity   . Obstructive sleep apnea on CPAP    uses cpap nightly  . Ovarian cyst rupture    possible  . Pneumonia   . Pneumonia 11/2014  . Radiation 04/05/15-05/19/15   right chest wall 50.4 Gy, mastectomy scar 10 Gy  . Sleep apnea   . Thyroid nodule 06/2014    PAST SURGICAL HISTORY: Past Surgical History:  Procedure Laterality Date  . APPENDECTOMY    . BIOPSY THYROID Bilateral 06/2014   2 nodules - Benign   . BREAST LUMPECTOMY WITH RADIOACTIVE SEED AND SENTINEL LYMPH NODE BIOPSY Right 12/27/2014   Procedure: BREAST LUMPECTOMY WITH RADIOACTIVE SEED AND SENTINEL LYMPH NODE BIOPSY;  Surgeon: Stark Klein, MD;  Location: [place];  Service: General;  Laterality: Right;  . BREAST REDUCTION SURGERY Bilateral 01/06/2015   Procedure: BILATERAL BREAST REDUCTION, RIGHT ONCOPLASTIC RECONSTRUCTION),LEFT BREAST REDUCTION FOR SYMMETRY;  Surgeon: [name], MD;  Location: [place];  Service: Plastics;  Laterality: Bilateral;  . BREATH TEK H PYLORI N/A 09/15/2012   Procedure: BREATH TEK H PYLORI;  Surgeon: Pedro Earls, MD;  Location: [facility];  Service: General;  Laterality: N/A;  . BUNIONECTOMY Left 12/2013  . CARPAL TUNNEL RELEASE Right   . CARPAL TUNNEL RELEASE Left   . COLONOSCOPY    . DUPUYTREN CONTRACTURE RELEASE    . GASTRIC ROUX-EN-Y N/A 11/02/2012   Procedure: LAPAROSCOPIC ROUX-EN-Y GASTRIC;  Surgeon: Pedro Earls,  MD;  Location: [facility];  Service: General;  Laterality: N/A;  . IR GENERIC HISTORICAL  03/18/2016   IR US GUIDE VASC ACCESS LEFT 03/18/2016 Markus Daft, MD [facility]  . IR GENERIC HISTORICAL  03/18/2016   IR FLUORO GUIDE CV LINE LEFT 03/18/2016 Markus Daft, MD [facility]  . LAPAROSCOPIC APPENDECTOMY N/A 07/22/2015   Procedure: APPENDECTOMY LAPAROSCOPIC;  Surgeon: Michael Boston, MD;  Location: [facility];  Service: General;  Laterality: N/A;  . MASTECTOMY Right 02/15/2015   modified  . MODIFIED MASTECTOMY Right 02/15/2015  Procedure: RIGHT MODIFIED RADICAL MASTECTOMY;  Surgeon: Stark Klein, MD;  Location: [place];  Service: General;  Laterality: Right;  . PORT-A-CATH REMOVAL Left 10/10/2015   Procedure: PORT REMOVAL;  Surgeon: Stark Klein, MD;  Location: [place];  Service: General;  Laterality: Left;  . PORTACATH PLACEMENT Left 08/02/2014   Procedure: INSERTION PORT-A-CATH;  Surgeon: Stark Klein, MD;  Location: [place];  Service: General;  Laterality: Left;  . PORTACATH PLACEMENT N/A 11/05/2016   Procedure: INSERTION PORT-A-CATH;  Surgeon: Stark Klein, MD;  Location: [place];  Service: General;  Laterality: N/A;  . ROTATOR CUFF REPAIR Right 2005  . SCAR REVISION Right 12/08/2015   Procedure: COMPLEX REPAIR RIGHT CHEST 10-15CM;  Surgeon: [name], MD;  Location: [place];  Service: Plastics;  Laterality: Right;  . TOE SURGERY  1970s   bone spur  . TRIGGER FINGER RELEASE     x3    SOCIAL HISTORY: Social History   Tobacco Use  . Smoking status: Never Smoker  . Smokeless tobacco: Never Used  Substance Use Topics  . Alcohol use: Yes    Alcohol/week: 0.0 oz    Comment: social  . Drug use: No    FAMILY HISTORY: Family History  Problem Relation Age of Onset  . CVA Mother   . Heart disease Mother   . Sudden death Mother   . Diabetes Father   . Heart failure Father   . Hypertension Father   . Kidney disease Father   . Obesity Father   .  Hypertension Sister   . Diabetes Brother   . Breast cancer Paternal Grandmother   . Diabetes Paternal Uncle     ROS: Review of Systems  Constitutional: Positive for weight loss.  Gastrointestinal: Positive for nausea.  Endo/Heme/Allergies:       Negative hypoglycemia    PHYSICAL EXAM: Blood pressure 129/75, pulse 63, temperature 97.6 F (36.4 C), temperature source Oral, height 5\' 9"  (1.753 m), weight 216 lb (98 kg), last menstrual period 10/22/2007, SpO2 100 %. Body mass index is 31.9 kg/m. Physical Exam  Constitutional: She is oriented to person, place, and time. She appears well-developed and well-nourished.  Cardiovascular: Normal rate.  Pulmonary/Chest: Effort normal.  Musculoskeletal: Normal range of motion.  Neurological: She is oriented to person, place, and time.  Skin: Skin is warm and dry.  Psychiatric: She has a normal mood and affect. Her behavior is normal.  Vitals reviewed.   RECENT LABS AND TESTS: BMET    Component Value Date/Time   NA 142 12/17/2016 0816   K 4.2 12/17/2016 0816   CL 107 11/05/2016 0724   CO2 21 (L) 12/17/2016 0816   GLUCOSE 118 12/17/2016 0816   BUN 47.2 (H) 12/17/2016 0816   CREATININE 1.6 (H) 12/17/2016 0816   CALCIUM 9.4 12/17/2016 0816   GFRNONAA 41 (L) 11/05/2016 0724   GFRNONAA 55 (L) 02/18/2013 0827   GFRAA 47 (L) 11/05/2016 0724   GFRAA 63 02/18/2013 0827   Lab Results  Component Value Date   HGBA1C 8.2 (H) 11/05/2016   HGBA1C 8.1 (H) 08/09/2016   HGBA1C 6.5 (H) 01/03/2015   HGBA1C 6.9 (H) 02/18/2013   HGBA1C 7.7 (H) 11/02/2012   Lab Results  Component Value Date   INSULIN 18.3 08/09/2016   CBC    Component Value Date/Time   WBC 6.3 12/17/2016 0816   WBC 4.7 11/05/2016 0724   RBC 3.69 (L) 12/17/2016 0816   RBC 3.87 11/05/2016 0724   HGB 11.4 (  L) 12/17/2016 0816   HCT 33.8 (L) 12/17/2016 0816   PLT 154 12/17/2016 0816   MCV 91.6 12/17/2016 0816   MCH 30.9 12/17/2016 0816   MCH 29.5 11/05/2016 0724   MCHC  33.7 12/17/2016 0816   MCHC 33.1 11/05/2016 0724   RDW 14.7 (H) 12/17/2016 0816   LYMPHSABS 1.4 12/17/2016 0816   MONOABS 0.4 12/17/2016 0816   EOSABS 0.2 12/17/2016 0816   EOSABS 0.1 08/09/2016 1229   BASOSABS 0.0 12/17/2016 0816   Iron/TIBC/Ferritin/ %Sat    Component Value Date/Time   IRON 58 02/18/2013 0827   TIBC 305 02/18/2013 0827   FERRITIN 133 10/31/2014 0816   IRONPCTSAT 19 (L) 02/18/2013 0827   Lipid Panel     Component Value Date/Time   CHOL 182 08/09/2016 1229   TRIG 176 (H) 08/09/2016 1229   HDL 62 08/09/2016 1229   CHOLHDL 2.9 02/18/2013 0827   VLDL 23 02/18/2013 0827   LDLCALC 85 08/09/2016 1229   Hepatic Function Panel     Component Value Date/Time   PROT 7.2 12/17/2016 0816   ALBUMIN 3.6 12/17/2016 0816   AST 34 12/17/2016 0816   ALT 49 12/17/2016 0816   ALKPHOS 111 12/17/2016 0816   BILITOT 0.57 12/17/2016 0816      Component Value Date/Time   TSH 2.170 08/09/2016 1229    ASSESSMENT AND PLAN: Type 2 diabetes mellitus without complication, without long-term current use of insulin ([place]) - Plan: metFORMIN (GLUCOPHAGE) 500 MG tablet  Class 1 obesity with serious comorbidity and body mass index (BMI) of 31.0 to 31.9 in adult, unspecified obesity type  PLAN:  Diabetes II Tiphani has been given extensive diabetes education by myself today including ideal fasting and post-prandial blood glucose readings, individual ideal Hgb A1c goals  and hypoglycemia prevention. We discussed the importance of good blood sugar control to decrease the likelihood of diabetic complications such as nephropathy, neuropathy, limb loss, blindness, coronary artery disease, and death. We discussed the importance of intensive lifestyle modification including diet, exercise and weight loss as the first line treatment for diabetes. Jaylene agrees to increase Victoza to 1.2 mg qd and we will refill Metformin for 1 month and Tariya will follow up at the agreed upon  time.  Obesity Darlene [name] is currently in the action stage of change. As such, her goal is to continue with weight loss efforts She has agreed to keep a food journal with 1500 calories and 80+ grams of protein daily Darlene [name] has been instructed to work up to a goal of 150 minutes of combined cardio and strengthening exercise per week for weight loss and overall health benefits. We discussed the following Behavioral Modification Strategies today: increasing lean protein intake, decrease eating out and emotional eating strategies  Darlene [name] has agreed to follow up with our clinic in 2 weeks. She was informed of the importance of frequent follow up visits to maximize her success with intensive lifestyle modifications for her multiple health conditions.  I, Doreene Nest, am acting as transcriptionist for Dennard Nip, MD  I have reviewed the above documentation for accuracy and completeness, and I agree with the above. -Dennard Nip, MD    OBESITY BEHAVIORAL INTERVENTION VISIT  Today's visit was # 8 out of 22.  Starting weight: 225 lbs Starting date: 08/09/16 Today's weight : 216 lbs Today's date: 12/18/2016 Total lbs lost to date: 9 (Patients must lose 7 lbs in the first 6 months to continue with counseling)   ASK: We discussed the diagnosis of  obesity with Darlene [name] today and Darlene [name] agreed to give [name] permission to discuss obesity behavioral modification therapy today.  ASSESS: Darlene [name] has the diagnosis of obesity and her BMI today is 31.88 Darlene [name] is in the action stage of change   ADVISE: Darlene [name] was educated on the multiple health risks of obesity as well as the benefit of weight loss to improve her health. She was advised of the need for long term treatment and the importance of lifestyle modifications.  AGREE: Multiple dietary modification options and treatment options were discussed and  Darlene [name] agreed to keep a food journal with 1500 calories and 80+ grams  of protein daily We discussed the following Behavioral Modification Strategies today: increasing lean protein intake, decrease eating out and emotional eating strategies

## 2016-12-30 ENCOUNTER — Ambulatory Visit (INDEPENDENT_AMBULATORY_CARE_PROVIDER_SITE_OTHER): Payer: Self-pay | Admitting: Family Medicine

## 2017-01-01 ENCOUNTER — Ambulatory Visit (INDEPENDENT_AMBULATORY_CARE_PROVIDER_SITE_OTHER): Payer: Commercial Managed Care - PPO | Admitting: Family Medicine

## 2017-01-01 VITALS — BP 122/68 | HR 58 | Temp 98.0°F | Ht 69.0 in | Wt 216.0 lb

## 2017-01-01 DIAGNOSIS — F3289 Other specified depressive episodes: Secondary | ICD-10-CM | POA: Diagnosis not present

## 2017-01-01 DIAGNOSIS — Z9189 Other specified personal risk factors, not elsewhere classified: Secondary | ICD-10-CM | POA: Diagnosis not present

## 2017-01-01 DIAGNOSIS — E119 Type 2 diabetes mellitus without complications: Secondary | ICD-10-CM | POA: Diagnosis not present

## 2017-01-01 DIAGNOSIS — E669 Obesity, unspecified: Secondary | ICD-10-CM | POA: Diagnosis not present

## 2017-01-01 DIAGNOSIS — Z794 Long term (current) use of insulin: Secondary | ICD-10-CM

## 2017-01-01 DIAGNOSIS — Z6832 Body mass index (BMI) 32.0-32.9, adult: Secondary | ICD-10-CM | POA: Diagnosis not present

## 2017-01-01 MED ORDER — INSULIN PEN NEEDLE 32G X 4 MM MISC: 1.0000 | Freq: Every day | 0 refills | Status: DC

## 2017-01-01 MED ORDER — BUPROPION HCL ER (SR) 200 MG PO TB12: 200.0000 mg | ORAL_TABLET | Freq: Every day | ORAL | 0 refills | Status: DC

## 2017-01-01 MED ORDER — LIRAGLUTIDE 18 MG/3ML ~~LOC~~ SOPN: 1.2000 mg | PEN_INJECTOR | SUBCUTANEOUS | 0 refills | Status: DC

## 2017-01-01 NOTE — Progress Notes (Signed)
Office: [phone]  /  Fax: [phone]   HPI:   Chief Complaint: OBESITY Darlene [name] is here to discuss her progress with her obesity treatment plan. She is on the keep a food journal with 1500 calories and 80+ grams of protein daily and is following her eating plan approximately 50 % of the time. She states she is exercising 0 minutes 0 times per week. Darlene [name] has done well maintaining her weight over the las few weeks. She has had increased temptations from her husband, who is bringing home sweets. Her weight is 216 lb (98 kg) today and has maintained weight over a period of 2 weeks since her last visit. She has lost 9 lbs since starting treatment with [name].  Diabetes II with the use of insulin Darlene [name] has a diagnosis of diabetes type II. Darlene [name] states fasting BGs range between 65 and 98 and 2 hour post prandial range between 120 and 160's and she denies any hypoglycemic episodes. She has been working on intensive lifestyle modifications including diet, exercise, and weight loss to help control her blood glucose levels.  At risk for cardiovascular disease Darlene [name] is at a higher than average risk for cardiovascular disease due to obesity and diabetes. She currently denies any chest pain.  Depression with emotional eating behaviors Darlene [name] mood improved on Wellbutrin. She is sleeping well and her blood pressure is stable. Darlene [name] struggles with emotional eating and using food for comfort to the extent that it is negatively impacting her health. She often snacks when she is not hungry. Darlene [name] sometimes feels she is out of control and then feels guilty that she made poor food choices. She has been working on behavior modification techniques to help reduce her emotional eating and has been somewhat successful. She shows no sign of suicidal or homicidal ideations.  Depression screen Darlene [name] 2/9 09/12/2016 08/09/2016 01/01/2016 11/15/2015 10/12/2015  Decreased Interest 0 1 0 0 0  Down,  Depressed, Hopeless 0 1 0 0 0  PHQ - 2 Score 0 2 0 0 0  Altered sleeping - 0 - - -  Tired, decreased energy - 1 - - -  Change in appetite - 2 - - -  Feeling bad or failure about yourself  - 2 - - -  Trouble concentrating - 1 - - -  Moving slowly or fidgety/restless - 1 - - -  Suicidal thoughts - 0 - - -  PHQ-9 Score - 9 - - -  Some recent data might be hidden      ALLERGIES: Allergies  Allergen Reactions  . Nsaids Other (See Comments)    H/o gastric bypass - avoid NSAIDs  . Tape Hives and Rash    Adhesives in bandaids    MEDICATIONS: Current Outpatient Medications on File Prior to Visit  Medication Sig Dispense Refill  . acetaminophen (TYLENOL) 325 MG tablet Take 650 mg by mouth 2 (two) times daily as needed for moderate pain or headache. Reported on 04/17/2015    . buPROPion (WELLBUTRIN SR) 200 MG 12 hr tablet Take 1 tablet (200 mg total) daily at 12 noon by mouth. 90 tablet 0  . calcium citrate-vitamin D (CITRACAL+D) 315-200 MG-UNIT tablet Take 1 tablet by mouth 3 (three) times daily.    . cholecalciferol (VITAMIN D) 1000 units tablet Take 1,000 Units by mouth daily.    . diphenhydrAMINE (BENADRYL) 25 MG tablet Take 25 mg by mouth daily as needed for allergies or sleep. Reported on 03/06/2015    . glucose blood (ONETOUCH  VERIO) test strip 1 each by Other route as needed for other. Reported on 03/06/2015    . hydrochlorothiazide (HYDRODIURIL) 25 MG tablet TAKE 1 TABLET BY MOUTH EVERY DAY 30 tablet 11  . Insulin Glargine (BASAGLAR KWIKPEN) 100 UNIT/ML SOPN Inject 0.25 mLs (25 Units total) into the skin at bedtime. (Patient taking differently: Inject 25 Units into the skin at bedtime. Decrease to 28 units) 15 pen 0  . Insulin Pen Needle (BD PEN NEEDLE NANO U/F) 32G X 4 MM MISC 1 Package by Does not apply route daily at 12 noon. 100 each 0  . liraglutide (VICTOZA) 18 MG/3ML SOPN Inject 0.1 mLs (0.6 mg total) into the skin every morning. 1 pen 0  . losartan (COZAAR) 100 MG tablet Take  1 tablet by mouth daily. Reported on 03/06/2015    . Melatonin 5 MG TABS Take 5 mg by mouth at bedtime. Reported on 03/06/2015    . metFORMIN (GLUCOPHAGE) 500 MG tablet Take 1 tablet (500 mg total) by mouth daily. 30 tablet 0  . mometasone (NASONEX) 50 MCG/ACT nasal spray Place 1-2 sprays into the nose daily as needed (for allergies.).     Darlene [name] Kitchen Pediatric Multivitamins-Iron (FLINTSTONES PLUS IRON) chewable tablet Chew 1 tablet by mouth 2 (two) times daily.    . Probiotic Product ([employer] PROBIOTIC PO) Take 1 capsule by mouth daily. Reported on 03/06/2015    . Propylene Glycol (SYSTANE BALANCE OP) Apply 1-2 drops to eye 3 (three) times daily.    Darlene [name] Kitchen senna-docusate (SENNA PLUS) 8.6-50 MG tablet Take 2 tablets by mouth at bedtime.    . vortioxetine HBr (TRINTELLIX) 20 MG TABS Take 20 mg by mouth at bedtime. 30 tablet 0   No current facility-administered medications on file prior to visit.     PAST MEDICAL HISTORY: Past Medical History:  Diagnosis Date  . Anemia    hx of - during 1st round chemo  . Anxiety   . Arthritis    in fingers, shoulders  . Back pain   . Balance problem   . Breast cancer ([place])   . Breast cancer of upper-outer quadrant of right female breast ([place]) 07/22/2014  . Bunion, right foot   . Clumsiness   . Constipation   . Depression   . Diabetes mellitus without complication ([place])    70+ years  . Eating disorder    binge eating  . Epistaxis 08/26/2014  . Fatty liver   . HCAP (healthcare-associated pneumonia) 12/18/2014  . Headache   . Heart murmur    never had any problems  . History of radiation therapy 04/05/15-05/19/15   right chest wall was treated to 50.4 Gy in 28 fractions, right mastectomy scar was treated to 10 Gy in 5 fractions  . Hyperlipidemia   . Hypertension   . Joint pain   . Multiple food allergies   . Neuromuscular disorder ([place])    diabetic neuropathy  . Obesity   . Obstructive sleep apnea on CPAP    uses cpap nightly  . Ovarian cyst rupture    possible    . Pneumonia   . Pneumonia 11/2014  . Radiation 04/05/15-05/19/15   right chest wall 50.4 Gy, mastectomy scar 10 Gy  . Sleep apnea   . Thyroid nodule 06/2014    PAST SURGICAL HISTORY: Past Surgical History:  Procedure Laterality Date  . APPENDECTOMY    . BIOPSY THYROID Bilateral 06/2014   2 nodules - Benign   . BREAST LUMPECTOMY WITH RADIOACTIVE SEED AND SENTINEL  LYMPH NODE BIOPSY Right 12/27/2014   Procedure: BREAST LUMPECTOMY WITH RADIOACTIVE SEED AND SENTINEL LYMPH NODE BIOPSY;  Surgeon: Stark Klein, MD;  Location: [facility];  Service: General;  Laterality: Right;  . BREAST REDUCTION SURGERY Bilateral 01/06/2015   Procedure: BILATERAL BREAST REDUCTION, RIGHT ONCOPLASTIC RECONSTRUCTION),LEFT BREAST REDUCTION FOR SYMMETRY;  Surgeon: [name], MD;  Location: [facility];  Service: Plastics;  Laterality: Bilateral;  . BREATH TEK H PYLORI N/A 09/15/2012   Procedure: BREATH TEK H PYLORI;  Surgeon: Pedro Earls, MD;  Location: [facility];  Service: General;  Laterality: N/A;  . BUNIONECTOMY Left 12/2013  . CARPAL TUNNEL RELEASE Right   . CARPAL TUNNEL RELEASE Left   . COLONOSCOPY    . DUPUYTREN CONTRACTURE RELEASE    . GASTRIC ROUX-EN-Y N/A 11/02/2012   Procedure: LAPAROSCOPIC ROUX-EN-Y GASTRIC;  Surgeon: Pedro Earls, MD;  Location: [facility];  Service: General;  Laterality: N/A;  . IR GENERIC HISTORICAL  03/18/2016   IR US GUIDE VASC ACCESS LEFT 03/18/2016 Markus Daft, MD [facility]  . IR GENERIC HISTORICAL  03/18/2016   IR FLUORO GUIDE CV LINE LEFT 03/18/2016 Markus Daft, MD [facility]  . LAPAROSCOPIC APPENDECTOMY N/A 07/22/2015   Procedure: APPENDECTOMY LAPAROSCOPIC;  Surgeon: Michael Boston, MD;  Location: [facility];  Service: General;  Laterality: N/A;  . MASTECTOMY Right 02/15/2015   modified  . MODIFIED MASTECTOMY Right 02/15/2015   Procedure: RIGHT MODIFIED RADICAL MASTECTOMY;  Surgeon: Stark Klein, MD;  Location: [facility];  Service: General;  Laterality: Right;  .  PORT-A-CATH REMOVAL Left 10/10/2015   Procedure: PORT REMOVAL;  Surgeon: Stark Klein, MD;  Location: [facility];  Service: General;  Laterality: Left;  . PORTACATH PLACEMENT Left 08/02/2014   Procedure: INSERTION PORT-A-CATH;  Surgeon: Stark Klein, MD;  Location: [facility];  Service: General;  Laterality: Left;  . PORTACATH PLACEMENT N/A 11/05/2016   Procedure: INSERTION PORT-A-CATH;  Surgeon: Stark Klein, MD;  Location: [facility];  Service: General;  Laterality: N/A;  . ROTATOR CUFF REPAIR Right 2005  . SCAR REVISION Right 12/08/2015   Procedure: COMPLEX REPAIR RIGHT CHEST 10-15CM;  Surgeon: [name], MD;  Location: [facility];  Service: Plastics;  Laterality: Right;  . TOE SURGERY  1970s   bone spur  . TRIGGER FINGER RELEASE     x3    SOCIAL HISTORY: Social History   Tobacco Use  . Smoking status: Never Smoker  . Smokeless tobacco: Never Used  Substance Use Topics  . Alcohol use: Yes    Alcohol/week: 0.0 oz    Comment: social  . Drug use: No    FAMILY HISTORY: Family History  Problem Relation Age of Onset  . CVA Mother   . Heart disease Mother   . Sudden death Mother   . Diabetes Father   . Heart failure Father   . Hypertension Father   . Kidney disease Father   . Obesity Father   . Hypertension Sister   . Diabetes Brother   . Breast cancer Paternal Grandmother   . Diabetes Paternal Uncle     ROS: Review of Systems  Constitutional: Negative for weight loss.  Cardiovascular: Negative for chest pain.  Endo/Heme/Allergies:       Negative hypoglycemia  Psychiatric/Behavioral: Positive for depression. Negative for suicidal ideas. The patient does not have insomnia.     PHYSICAL EXAM: Blood pressure 122/68, pulse (!) 58, temperature 98 F (36.7 C), temperature source Oral, height 5\' 9"  (1.753 m),  weight 216 lb (98 kg), last menstrual period 10/22/2007, SpO2 100 %. Body mass index is 31.9 kg/m. Physical Exam  Constitutional: She  is oriented to person, place, and time. She appears well-developed and well-nourished.  Cardiovascular: Normal rate.  Pulmonary/Chest: Effort normal.  Musculoskeletal: Normal range of motion.  Neurological: She is oriented to person, place, and time.  Skin: Skin is warm and dry.  Psychiatric: She has a normal mood and affect. Her behavior is normal.  Vitals reviewed.   RECENT LABS AND TESTS: BMET    Component Value Date/Time   NA 142 12/17/2016 0816   K 4.2 12/17/2016 0816   CL 107 11/05/2016 0724   CO2 21 (L) 12/17/2016 0816   GLUCOSE 118 12/17/2016 0816   BUN 47.2 (H) 12/17/2016 0816   CREATININE 1.6 (H) 12/17/2016 0816   CALCIUM 9.4 12/17/2016 0816   GFRNONAA 41 (L) 11/05/2016 0724   GFRNONAA 55 (L) 02/18/2013 0827   GFRAA 47 (L) 11/05/2016 0724   GFRAA 63 02/18/2013 0827   Lab Results  Component Value Date   HGBA1C 8.2 (H) 11/05/2016   HGBA1C 8.1 (H) 08/09/2016   HGBA1C 6.5 (H) 01/03/2015   HGBA1C 6.9 (H) 02/18/2013   HGBA1C 7.7 (H) 11/02/2012   Lab Results  Component Value Date   INSULIN 18.3 08/09/2016   CBC    Component Value Date/Time   WBC 6.3 12/17/2016 0816   WBC 4.7 11/05/2016 0724   RBC 3.69 (L) 12/17/2016 0816   RBC 3.87 11/05/2016 0724   HGB 11.4 (L) 12/17/2016 0816   HCT 33.8 (L) 12/17/2016 0816   PLT 154 12/17/2016 0816   MCV 91.6 12/17/2016 0816   MCH 30.9 12/17/2016 0816   MCH 29.5 11/05/2016 0724   MCHC 33.7 12/17/2016 0816   MCHC 33.1 11/05/2016 0724   RDW 14.7 (H) 12/17/2016 0816   LYMPHSABS 1.4 12/17/2016 0816   MONOABS 0.4 12/17/2016 0816   EOSABS 0.2 12/17/2016 0816   EOSABS 0.1 08/09/2016 1229   BASOSABS 0.0 12/17/2016 0816   Iron/TIBC/Ferritin/ %Sat    Component Value Date/Time   IRON 58 02/18/2013 0827   TIBC 305 02/18/2013 0827   FERRITIN 133 10/31/2014 0816   IRONPCTSAT 19 (L) 02/18/2013 0827   Lipid Panel     Component Value Date/Time   CHOL 182 08/09/2016 1229   TRIG 176 (H) 08/09/2016 1229   HDL 62 08/09/2016  1229   CHOLHDL 2.9 02/18/2013 0827   VLDL 23 02/18/2013 0827   LDLCALC 85 08/09/2016 1229   Hepatic Function Panel     Component Value Date/Time   PROT 7.2 12/17/2016 0816   ALBUMIN 3.6 12/17/2016 0816   AST 34 12/17/2016 0816   ALT 49 12/17/2016 0816   ALKPHOS 111 12/17/2016 0816   BILITOT 0.57 12/17/2016 0816      Component Value Date/Time   TSH 2.170 08/09/2016 1229    ASSESSMENT AND PLAN: Type 2 diabetes mellitus without complication, with long-term current use of insulin (HCC) - Plan: liraglutide (VICTOZA) 18 MG/3ML SOPN, Insulin Pen Needle (BD PEN NEEDLE NANO U/F) 32G X 4 MM MISC  Other depression - with emotional eating - Plan: buPROPion (WELLBUTRIN SR) 200 MG 12 hr tablet  At risk for heart disease  Class 1 obesity with serious comorbidity and body mass index (BMI) of 32.0 to 32.9 in adult, unspecified obesity type  PLAN:  Diabetes II with the use of insulin Darlene [name] has been given extensive diabetes education by myself today including ideal fasting and post-prandial  blood glucose readings, individual ideal Hgb A1c goals  and hypoglycemia prevention. We discussed the importance of good blood sugar control to decrease the likelihood of diabetic complications such as nephropathy, neuropathy, limb loss, blindness, coronary artery disease, and death. We discussed the importance of intensive lifestyle modification including diet, exercise and weight loss as the first line treatment for diabetes. Darlene [name] agrees to [employer] to 28 units and continue Victoza 1.2 mg, we will refill #2 pen with no refills and nano needles. Darlene [name] agrees to follow up at the agreed upon time.  Cardiovascular risk counseling Darlene [name] was given extended (15 minutes) coronary artery disease prevention counseling today. She is 66 y.o. female and has risk factors for heart disease including obesity and diabetes. We discussed intensive lifestyle modifications today with an emphasis on specific  weight loss instructions and strategies. Pt was also informed of the importance of increasing exercise and decreasing saturated fats to help prevent heart disease.  Depression with Emotional Eating Behaviors We discussed behavior modification techniques today to help Darlene [name] deal with her emotional eating and depression. She has agreed to take Wellbutrin SR 200 mg qd #30 with no refills and will follow up as directed.  Obesity Darlene [name] is currently in the action stage of change. As such, her goal is to continue with weight loss efforts She has agreed to keep a food journal with 1500 calories and 90+ grams of protein daily Darlene [name] has been instructed to work up to a goal of 150 minutes of combined cardio and strengthening exercise per week for weight loss and overall health benefits. We discussed the following Behavioral Modification Strategies today: increasing lean protein intake, dealing with family or coworker sabotage and travel eating strategies   Darlene [name] has agreed to follow up with our clinic in 3 weeks. She was informed of the importance of frequent follow up visits to maximize her success with intensive lifestyle modifications for her multiple health conditions.  I, Darlene [name], am acting as transcriptionist for Dennard Nip, MD  I have reviewed the above documentation for accuracy and completeness, and I agree with the above. -Dennard Nip, MD    OBESITY BEHAVIORAL INTERVENTION VISIT  Today's visit was # 9 out of 22.  Starting weight: 225 lbs Starting date: 08/09/16 Today's weight : 216 lbs Today's date: 01/01/2017 Total lbs lost to date: 9 (Patients must lose 7 lbs in the first 6 months to continue with counseling)   ASK: We discussed the diagnosis of obesity with Darlene [name] today and Darlene [name] agreed to give [name] permission to discuss obesity behavioral modification therapy today.  ASSESS: Darlene [name] has the diagnosis of obesity and her BMI today is  31.88 Darlene [name] is in the action stage of change   ADVISE: Darlene [name] was educated on the multiple health risks of obesity as well as the benefit of weight loss to improve her health. She was advised of the need for long term treatment and the importance of lifestyle modifications.  AGREE: Multiple dietary modification options and treatment options were discussed and  Monaye agreed to keep a food journal with 1500 calories and 90+ grams of protein daily We discussed the following Behavioral Modification Strategies today: increasing lean protein intake, dealing with family or coworker sabotage and travel eating strategies

## 2017-01-07 ENCOUNTER — Ambulatory Visit: Payer: Commercial Managed Care - PPO

## 2017-01-07 ENCOUNTER — Ambulatory Visit (HOSPITAL_BASED_OUTPATIENT_CLINIC_OR_DEPARTMENT_OTHER): Payer: Commercial Managed Care - PPO

## 2017-01-07 ENCOUNTER — Other Ambulatory Visit (HOSPITAL_BASED_OUTPATIENT_CLINIC_OR_DEPARTMENT_OTHER): Payer: Commercial Managed Care - PPO

## 2017-01-07 VITALS — BP 128/53 | HR 69 | Temp 98.2°F

## 2017-01-07 DIAGNOSIS — Z5112 Encounter for antineoplastic immunotherapy: Secondary | ICD-10-CM | POA: Diagnosis not present

## 2017-01-07 DIAGNOSIS — C50911 Malignant neoplasm of unspecified site of right female breast: Secondary | ICD-10-CM

## 2017-01-07 DIAGNOSIS — C50411 Malignant neoplasm of upper-outer quadrant of right female breast: Secondary | ICD-10-CM | POA: Diagnosis not present

## 2017-01-07 DIAGNOSIS — C773 Secondary and unspecified malignant neoplasm of axilla and upper limb lymph nodes: Principal | ICD-10-CM

## 2017-01-07 DIAGNOSIS — Z171 Estrogen receptor negative status [ER-]: Secondary | ICD-10-CM

## 2017-01-07 DIAGNOSIS — Z95828 Presence of other vascular implants and grafts: Secondary | ICD-10-CM

## 2017-01-07 LAB — COMPREHENSIVE METABOLIC PANEL
ALT: 82 U/L — ABNORMAL HIGH (ref 0–55)
AST: 51 U/L — ABNORMAL HIGH (ref 5–34)
Albumin: 3.6 g/dL (ref 3.5–5.0)
Alkaline Phosphatase: 115 U/L (ref 40–150)
Anion Gap: 9 mEq/L (ref 3–11)
BUN: 33.6 mg/dL — ABNORMAL HIGH (ref 7.0–26.0)
CO2: 24 mEq/L (ref 22–29)
Calcium: 9.3 mg/dL (ref 8.4–10.4)
Chloride: 108 mEq/L (ref 98–109)
Creatinine: 1.5 mg/dL — ABNORMAL HIGH (ref 0.6–1.1)
EGFR: 36 mL/min/{1.73_m2} — ABNORMAL LOW (ref >60)
Glucose: 134 mg/dl (ref 70–140)
Potassium: 4.1 mEq/L (ref 3.5–5.1)
Sodium: 140 mEq/L (ref 136–145)
Total Bilirubin: 0.72 mg/dL (ref 0.20–1.20)
Total Protein: 6.7 g/dL (ref 6.4–8.3)

## 2017-01-07 LAB — CBC WITH DIFFERENTIAL/PLATELET
BASO%: 0.3 % (ref 0.0–2.0)
Basophils Absolute: 0 10*3/uL (ref 0.0–0.1)
EOS%: 2.1 % (ref 0.0–7.0)
Eosinophils Absolute: 0.1 10*3/uL (ref 0.0–0.5)
HCT: 31.9 % — ABNORMAL LOW (ref 34.8–46.6)
HGB: 10.8 g/dL — ABNORMAL LOW (ref 11.6–15.9)
LYMPH%: 24.8 % (ref 14.0–49.7)
MCH: 30.8 pg (ref 25.1–34.0)
MCHC: 33.9 g/dL (ref 31.5–36.0)
MCV: 90.8 fL (ref 79.5–101.0)
MONO#: 0.3 10*3/uL (ref 0.1–0.9)
MONO%: 7.7 % (ref 0.0–14.0)
NEUT#: 2.9 10*3/uL (ref 1.5–6.5)
NEUT%: 65.1 % (ref 38.4–76.8)
Platelets: 126 10*3/uL — ABNORMAL LOW (ref 145–400)
RBC: 3.52 10*6/uL — ABNORMAL LOW (ref 3.70–5.45)
RDW: 14.8 % — ABNORMAL HIGH (ref 11.2–14.5)
WBC: 4.4 10*3/uL (ref 3.9–10.3)
lymph#: 1.1 10*3/uL (ref 0.9–3.3)

## 2017-01-07 MED ORDER — SODIUM CHLORIDE 0.9 % IJ SOLN
10.0000 mL | INTRAMUSCULAR | Status: DC | PRN
  Administered 2017-01-07: 10 mL via INTRAVENOUS
  Filled 2017-01-07: qty 10

## 2017-01-07 MED ORDER — ACETAMINOPHEN 325 MG PO TABS
650.0000 mg | ORAL_TABLET | Freq: Once | ORAL | Status: AC
  Administered 2017-01-07: 650 mg via ORAL

## 2017-01-07 MED ORDER — HEPARIN SOD (PORK) LOCK FLUSH 100 UNIT/ML IV SOLN
500.0000 [IU] | Freq: Once | INTRAVENOUS | Status: AC | PRN
  Administered 2017-01-07: 500 [IU]
  Filled 2017-01-07: qty 5

## 2017-01-07 MED ORDER — DIPHENHYDRAMINE HCL 12.5 MG/5ML PO ELIX
12.5000 mg | ORAL_SOLUTION | Freq: Once | ORAL | Status: AC
  Administered 2017-01-07: 12.5 mg via ORAL
  Filled 2017-01-07: qty 5

## 2017-01-07 MED ORDER — SODIUM CHLORIDE 0.9 % IV SOLN
Freq: Once | INTRAVENOUS | Status: AC
  Administered 2017-01-07: 09:00:00 via INTRAVENOUS

## 2017-01-07 MED ORDER — ACETAMINOPHEN 325 MG PO TABS
ORAL_TABLET | ORAL | Status: AC
  Filled 2017-01-07: qty 2

## 2017-01-07 MED ORDER — TRASTUZUMAB CHEMO 150 MG IV SOLR
600.0000 mg | Freq: Once | INTRAVENOUS | Status: AC
  Administered 2017-01-07: 600 mg via INTRAVENOUS
  Filled 2017-01-07: qty 28.57

## 2017-01-07 MED ORDER — SODIUM CHLORIDE 0.9% FLUSH
10.0000 mL | INTRAVENOUS | Status: DC | PRN
  Administered 2017-01-07: 10 mL
  Filled 2017-01-07: qty 10

## 2017-01-07 NOTE — Patient Instructions (Signed)
[facility] Discharge Instructions for Patients Receiving Chemotherapy  Today you received the following chemotherapy agents Herceptin  To help prevent nausea and vomiting after your treatment, we encourage you to take your nausea medication as directed   If you develop nausea and vomiting that is not controlled by your nausea medication, call the clinic.   BELOW ARE SYMPTOMS THAT SHOULD BE REPORTED IMMEDIATELY:  *FEVER GREATER THAN 100.5 F  *CHILLS WITH OR WITHOUT FEVER  NAUSEA AND VOMITING THAT IS NOT CONTROLLED WITH YOUR NAUSEA MEDICATION  *UNUSUAL SHORTNESS OF BREATH  *UNUSUAL BRUISING OR BLEEDING  TENDERNESS IN MOUTH AND THROAT WITH OR WITHOUT PRESENCE OF ULCERS  *URINARY PROBLEMS  *BOWEL PROBLEMS  UNUSUAL RASH Items with * indicate a potential emergency and should be followed up as soon as possible.  Feel free to call the clinic should you have any questions or concerns. The clinic phone number is [phone].  Please show the CHEMO ALERT CARD at check-in to the Emergency Department and triage nurse.   

## 2017-01-09 ENCOUNTER — Encounter (HOSPITAL_COMMUNITY): Payer: Commercial Managed Care - PPO | Admitting: Cardiology

## 2017-01-09 ENCOUNTER — Other Ambulatory Visit (HOSPITAL_COMMUNITY): Payer: Commercial Managed Care - PPO

## 2017-01-11 ENCOUNTER — Other Ambulatory Visit (INDEPENDENT_AMBULATORY_CARE_PROVIDER_SITE_OTHER): Payer: Self-pay | Admitting: Family Medicine

## 2017-01-11 DIAGNOSIS — E119 Type 2 diabetes mellitus without complications: Secondary | ICD-10-CM

## 2017-01-11 DIAGNOSIS — Z794 Long term (current) use of insulin: Secondary | ICD-10-CM

## 2017-01-16 ENCOUNTER — Ambulatory Visit (HOSPITAL_COMMUNITY)
Admission: RE | Admit: 2017-01-16 | Discharge: 2017-01-16 | Disposition: A | Discharge status: Home / Self Care | Payer: Commercial Managed Care - PPO | Source: Ambulatory Visit | Attending: Adult Health | Admitting: Adult Health

## 2017-01-16 DIAGNOSIS — I700 Atherosclerosis of aorta: Secondary | ICD-10-CM | POA: Insufficient documentation

## 2017-01-16 DIAGNOSIS — I2510 Atherosclerotic heart disease of native coronary artery without angina pectoris: Secondary | ICD-10-CM | POA: Diagnosis not present

## 2017-01-16 DIAGNOSIS — C50411 Malignant neoplasm of upper-outer quadrant of right female breast: Secondary | ICD-10-CM

## 2017-01-16 DIAGNOSIS — Z171 Estrogen receptor negative status [ER-]: Secondary | ICD-10-CM | POA: Diagnosis not present

## 2017-01-16 LAB — GLUCOSE, CAPILLARY: Glucose-Capillary: 116 mg/dL — ABNORMAL HIGH (ref 65–99)

## 2017-01-16 MED ORDER — FLUDEOXYGLUCOSE F - 18 (FDG) INJECTION
10.7000 | Freq: Once | INTRAVENOUS | Status: AC | PRN
  Administered 2017-01-16: 10.7 via INTRAVENOUS

## 2017-01-22 ENCOUNTER — Ambulatory Visit (HOSPITAL_BASED_OUTPATIENT_CLINIC_OR_DEPARTMENT_OTHER)
Admission: RE | Admit: 2017-01-22 | Discharge: 2017-01-22 | Disposition: A | Discharge status: Home / Self Care | Payer: BC Managed Care – PPO | Source: Ambulatory Visit | Attending: Cardiology | Admitting: Cardiology

## 2017-01-22 ENCOUNTER — Ambulatory Visit (HOSPITAL_COMMUNITY)
Admission: RE | Admit: 2017-01-22 | Discharge: 2017-01-22 | Disposition: A | Discharge status: Home / Self Care | Payer: BC Managed Care – PPO | Source: Ambulatory Visit | Attending: Cardiology | Admitting: Cardiology

## 2017-01-22 VITALS — BP 138/60 | HR 77 | Wt 220.8 lb

## 2017-01-22 DIAGNOSIS — Z171 Estrogen receptor negative status [ER-]: Secondary | ICD-10-CM | POA: Insufficient documentation

## 2017-01-22 DIAGNOSIS — I10 Essential (primary) hypertension: Secondary | ICD-10-CM | POA: Insufficient documentation

## 2017-01-22 DIAGNOSIS — E669 Obesity, unspecified: Secondary | ICD-10-CM | POA: Insufficient documentation

## 2017-01-22 DIAGNOSIS — Z1501 Genetic susceptibility to malignant neoplasm of breast: Secondary | ICD-10-CM | POA: Insufficient documentation

## 2017-01-22 DIAGNOSIS — G620 Drug-induced polyneuropathy: Secondary | ICD-10-CM | POA: Diagnosis present

## 2017-01-22 DIAGNOSIS — Z79899 Other long term (current) drug therapy: Secondary | ICD-10-CM | POA: Diagnosis not present

## 2017-01-22 DIAGNOSIS — G4733 Obstructive sleep apnea (adult) (pediatric): Secondary | ICD-10-CM | POA: Insufficient documentation

## 2017-01-22 DIAGNOSIS — Z9011 Acquired absence of right breast and nipple: Secondary | ICD-10-CM | POA: Diagnosis not present

## 2017-01-22 DIAGNOSIS — C50411 Malignant neoplasm of upper-outer quadrant of right female breast: Secondary | ICD-10-CM | POA: Insufficient documentation

## 2017-01-22 DIAGNOSIS — E785 Hyperlipidemia, unspecified: Secondary | ICD-10-CM | POA: Insufficient documentation

## 2017-01-22 DIAGNOSIS — Z794 Long term (current) use of insulin: Secondary | ICD-10-CM | POA: Insufficient documentation

## 2017-01-22 DIAGNOSIS — T451X5A Adverse effect of antineoplastic and immunosuppressive drugs, initial encounter: Secondary | ICD-10-CM | POA: Insufficient documentation

## 2017-01-22 DIAGNOSIS — E1140 Type 2 diabetes mellitus with diabetic neuropathy, unspecified: Secondary | ICD-10-CM | POA: Diagnosis not present

## 2017-01-22 DIAGNOSIS — Z823 Family history of stroke: Secondary | ICD-10-CM | POA: Diagnosis not present

## 2017-01-22 NOTE — Patient Instructions (Signed)
Your physician has requested that you have an echocardiogram. Echocardiography is a painless test that uses sound waves to create images of your heart. It provides your doctor with information about the size and shape of your heart and how well your heart's chambers and valves are working. This procedure takes approximately one hour. There are no restrictions for this procedure.  Your physician recommends that you schedule a follow-up appointment in: 3 months with Dr. McLean  an a echocardiogram    

## 2017-01-22 NOTE — Progress Notes (Signed)
  Echocardiogram 2D Echocardiogram has been performed.  Darlene [name] 01/22/2017, 11:42 AM

## 2017-01-22 NOTE — Progress Notes (Signed)
Patient ID: Darlene [name], female   DOB: 1951/01/08, 67 y.o.   MRN: [id number] Oncologist: Dr Darlene [name]   HPI: Ms Darlene [name] is a 67 year old with history of R breast cancer, right axillary lymph node biopsy 07/19/2014 for a cT2 pN1, stage IIB invasive ductal carcinoma, grade 2, estrogen and progesterone receptor negative, with an MIB-1 of 90%, and HER-2 positive , OSA -> CPAP, bariatric surgery, and DM, referred to cardio-oncology clinic by Dr Darlene [name]  Completed neoadjuvant chemotherapy with carboplatin, docetaxel, trastuzumab and pertuzumab given every 3 weeks 6. Trastuzumab + capecitabine were completed in 9/17.  She had lumpectomy 12/16 and had mastectomy in 1/17.  She has completed radiation.   She was found in 11/17 to have a recurrence by PET => right retropectoral lymph node enlarged. In 2/18, she started on treatment with ado-trastuzumab emtansine, now back on Herceptin.  Echo prior to treatment showed stable EF.    She returns today for followup screening for Herceptin cardiomyopathy.  Symptomatically doing well, no significant dyspnea or chest pain.    Echo (7/16) with EF 60-65%, grade II diastolic dysfunction.  Echo (10/16) with EF 66%, GLS -17%. Echo (1/17) with EF 60-65%, grade II diastolic dysfunction, lateral s' 10, GLS -18.8% Echo (4/17) with EF 60-65%, grade II diastolic dysfunction, lateral s' 9.5, strain poorly traced and off axis, normal RV size and systolic function.  Echo (6/17) with EF 55-60%, aortic sclerosis without stenosis, difficult images for strain, probably not accurate.  Echo (10/17) with EF 55-60%, mild LVH, moderate diastolic dysfunction, normal RV size and systolic function.  Echo (2/18) with EF 55-60%, GLS -15% but technically difficult study Echo (6/18) with EF 60-65%, GLS -15% but technically difficult study.  Echo (9/18) with EF 55-60%, GLS -19.8%, grade II diastolic dysfunction.  Echo (1/19) with EF 55-605, normal RV size and systolic function, GLS  -40.8% (done with different machine).   Review of Systems: All systems reviewed and negative except as per HPI.   Past Medical History:  Diagnosis Date  . Anemia    hx of - during 1st round chemo  . Anxiety   . Arthritis    in fingers, shoulders  . Back pain   . Balance problem   . Breast cancer ([place])   . Breast cancer of upper-outer quadrant of right female breast ([place]) 07/22/2014  . Bunion, right foot   . Clumsiness   . Constipation   . Depression   . Diabetes mellitus without complication ([place])    14+ years  . Eating disorder    binge eating  . Epistaxis 08/26/2014  . Fatty liver   . HCAP (healthcare-associated pneumonia) 12/18/2014  . Headache   . Heart murmur    never had any problems  . History of radiation therapy 04/05/15-05/19/15   right chest wall was treated to 50.4 Gy in 28 fractions, right mastectomy scar was treated to 10 Gy in 5 fractions  . Hyperlipidemia   . Hypertension   . Joint pain   . Multiple food allergies   . Neuromuscular disorder ([place])    diabetic neuropathy  . Obesity   . Obstructive sleep apnea on CPAP    uses cpap nightly  . Ovarian cyst rupture    possible  . Pneumonia   . Pneumonia 11/2014  . Radiation 04/05/15-05/19/15   right chest wall 50.4 Gy, mastectomy scar 10 Gy  . Sleep apnea   . Thyroid nodule 06/2014    Current Outpatient Medications  Medication Sig Dispense  Refill  . acetaminophen (TYLENOL) 325 MG tablet Take 650 mg by mouth 2 (two) times daily as needed for moderate pain or headache. Reported on 04/17/2015    . buPROPion (WELLBUTRIN SR) 200 MG 12 hr tablet Take 1 tablet (200 mg total) by mouth daily at 12 noon. 90 tablet 0  . calcium citrate-vitamin D (CITRACAL+D) 315-200 MG-UNIT tablet Take 1 tablet by mouth 3 (three) times daily.    . cholecalciferol (VITAMIN D) 1000 units tablet Take 1,000 Units by mouth daily.    . diphenhydrAMINE (BENADRYL) 25 MG tablet Take 25 mg by mouth daily as needed for allergies or sleep. Reported  on 03/06/2015    . glucose blood (ONETOUCH VERIO) test strip 1 each by Other route as needed for other. Reported on 03/06/2015    . hydrochlorothiazide (HYDRODIURIL) 25 MG tablet TAKE 1 TABLET BY MOUTH EVERY DAY 30 tablet 11  . Insulin Glargine (BASAGLAR KWIKPEN) 100 UNIT/ML SOPN Inject 0.25 mLs (25 Units total) into the skin at bedtime. (Patient taking differently: Inject 25 Units into the skin at bedtime. Decrease to 28 units) 15 pen 0  . Insulin Pen Needle (BD PEN NEEDLE NANO U/F) 32G X 4 MM MISC 1 Package by Does not apply route daily at 12 noon. 100 each 0  . liraglutide (VICTOZA) 18 MG/3ML SOPN Inject 0.2 mLs (1.2 mg total) into the skin every morning. 2 pen 0  . losartan (COZAAR) 100 MG tablet Take 1 tablet by mouth daily. Reported on 03/06/2015    . Melatonin 5 MG TABS Take 5 mg by mouth at bedtime. Reported on 03/06/2015    . metFORMIN (GLUCOPHAGE) 500 MG tablet Take 1 tablet (500 mg total) by mouth daily. 30 tablet 0  . mometasone (NASONEX) 50 MCG/ACT nasal spray Place 1-2 sprays into the nose daily as needed (for allergies.).     [name] Pediatric Multivitamins-Iron (FLINTSTONES PLUS IRON) chewable tablet Chew 1 tablet by mouth 2 (two) times daily.    . Probiotic Product ([employer] PROBIOTIC PO) Take 1 capsule by mouth daily. Reported on 03/06/2015    . Propylene Glycol (SYSTANE BALANCE OP) Apply 1-2 drops to eye 3 (three) times daily.    [name] senna-docusate (SENNA PLUS) 8.6-50 MG tablet Take 2 tablets by mouth at bedtime.    . vortioxetine HBr (TRINTELLIX) 20 MG TABS Take 20 mg by mouth at bedtime. 30 tablet 0   No current facility-administered medications for this encounter.     Allergies  Allergen Reactions  . Nsaids Other (See Comments)    H/o gastric bypass - avoid NSAIDs  . Tape Hives and Rash    Adhesives in bandaids      Social History   Socioeconomic History  . Marital status: Married    Spouse name: Darlene [name]  . Number of children: 0  . Years of education: Bachelor's  . Highest  education level: Not on file  Social Needs  . Financial resource strain: Not on file  . Food insecurity - worry: Not on file  . Food insecurity - inability: Not on file  . Transportation needs - medical: Not on file  . Transportation needs - non-medical: Not on file  Occupational History  . Occupation: Pensions consultant: [employer]  Tobacco Use  . Smoking status: Never Smoker  . Smokeless tobacco: Never Used  Substance and Sexual Activity  . Alcohol use: Yes    Alcohol/week: 0.0 oz    Comment: social  . Drug use: No  .  Sexual activity: Yes    Birth control/protection: Post-menopausal  Other Topics Concern  . Not on file  Social History Narrative   Patient is married Darlene [name]) and lives at home with her husband.   Patient does not have any children.   Patient is working for a [employer].   Patient has a Bachelor's degree.   Patient is right-handed.   Patient does not drink any caffeine.      Family History  Problem Relation Age of Onset  . CVA Mother   . Heart disease Mother   . Sudden death Mother   . Diabetes Father   . Heart failure Father   . Hypertension Father   . Kidney disease Father   . Obesity Father   . Hypertension Sister   . Diabetes Brother   . Breast cancer Paternal Grandmother   . Diabetes Paternal Uncle     Vitals:   01/22/17 1133  BP: 138/60  Pulse: 77  SpO2: 97%  Weight: 220 lb 12.8 oz (100.2 kg)    PHYSICAL EXAM: General: NAD Neck: No JVD, no thyromegaly or thyroid nodule.  Lungs: Clear to auscultation bilaterally with normal respiratory effort. CV: Nondisplaced PMI.  Heart regular S1/S2, no S3/S4, no murmur.  No peripheral edema.  No carotid bruit.  Normal pedal pulses.  Abdomen: Soft, nontender, no hepatosplenomegaly, no distention.  Skin: Intact without lesions or rashes.  Neurologic: Alert and oriented x 3.  Psych: Normal affect. Extremities: No clubbing or cyanosis.  HEENT: Normal.   ASSESSMENT &  PLAN:  1. Breast cancer: She has had recurrence and got ado-trastuzumab emtansine, now back on Herceptin.  I reviewed today's echo: EF stable, strain mildly less negative but done on new machine that has generally been reading a less negative strain than on the older machine.  - Repeat echo in 3 months with followup.  2. HTN: BP controlled on losartan.  3. OSA: Continue nightly CPAP   Loralie Champagne 01/22/2017

## 2017-01-23 ENCOUNTER — Ambulatory Visit (INDEPENDENT_AMBULATORY_CARE_PROVIDER_SITE_OTHER): Payer: BC Managed Care – PPO | Admitting: Family Medicine

## 2017-01-23 VITALS — BP 111/70 | HR 74 | Temp 98.2°F | Ht 69.0 in | Wt 217.0 lb

## 2017-01-23 DIAGNOSIS — E669 Obesity, unspecified: Secondary | ICD-10-CM

## 2017-01-23 DIAGNOSIS — E119 Type 2 diabetes mellitus without complications: Secondary | ICD-10-CM

## 2017-01-23 DIAGNOSIS — Z794 Long term (current) use of insulin: Secondary | ICD-10-CM

## 2017-01-23 DIAGNOSIS — Z6832 Body mass index (BMI) 32.0-32.9, adult: Secondary | ICD-10-CM | POA: Diagnosis not present

## 2017-01-23 DIAGNOSIS — F3289 Other specified depressive episodes: Secondary | ICD-10-CM

## 2017-01-23 MED ORDER — INSULIN PEN NEEDLE 32G X 4 MM MISC: 1.0000 | Freq: Every day | 0 refills | Status: DC

## 2017-01-23 MED ORDER — METFORMIN HCL 500 MG PO TABS: 500.0000 mg | ORAL_TABLET | Freq: Every day | ORAL | 0 refills | Status: DC

## 2017-01-23 MED ORDER — BUPROPION HCL ER (SR) 200 MG PO TB12: 200.0000 mg | ORAL_TABLET | Freq: Every day | ORAL | 0 refills | Status: DC

## 2017-01-23 MED ORDER — BASAGLAR KWIKPEN 100 UNIT/ML ~~LOC~~ SOPN: 25.0000 [IU] | PEN_INJECTOR | Freq: Every day | SUBCUTANEOUS | 0 refills | Status: DC

## 2017-01-23 NOTE — Progress Notes (Signed)
Office: [phone]  /  Fax: [phone]   HPI:   Chief Complaint: OBESITY Darlene [name] is here to discuss her progress with her obesity treatment plan. She is on the keep a food journal with 1500 calories and 90+ grams of protein daily and is following her eating plan approximately 40 % of the time. She states she is exercising 0 minutes 0 times per week. Darlene [name] has had increased temptations and increased celebration eating over the holidays. Darlene [name] has been eating out a lot. Her weight is 217 lb (98.4 kg) today and has had a weight gain of 1 pound over a period of 3 weeks since her last visit. She has lost 8 lbs since starting treatment with [name].  Diabetes II on insulin uncontrolled Darlene [name] has a diagnosis of diabetes type II. She is on Basaglar 23 units (decreased from 25 units) and states fasting BGs range between 78 and 137. Darlene [name] denies any hypoglycemic episodes. She has been working on intensive lifestyle modifications including diet, exercise, and weight loss to help control her blood glucose levels.  Depression with emotional eating behaviors Darlene [name] is struggling with emotional eating and using food for comfort to the extent that it is negatively impacting her health. She often snacks when she is not hungry. Darlene [name] sometimes feels she is out of control and then feels guilty that she made poor food choices. She has been working on behavior modification techniques to help reduce her emotional eating and has been somewhat successful. Her mood is stable and she shows no sign of suicidal or homicidal ideations.  Depression screen [facility] 2/9 09/12/2016 08/09/2016 01/01/2016 11/15/2015 10/12/2015  Decreased Interest 0 1 0 0 0  Down, Depressed, Hopeless 0 1 0 0 0  PHQ - 2 Score 0 2 0 0 0  Altered sleeping - 0 - - -  Tired, decreased energy - 1 - - -  Change in appetite - 2 - - -  Feeling bad or failure about yourself  - 2 - - -  Trouble concentrating - 1 - - -  Moving slowly or  fidgety/restless - 1 - - -  Suicidal thoughts - 0 - - -  PHQ-9 Score - 9 - - -  Some recent data might be hidden      ALLERGIES: Allergies  Allergen Reactions  . Nsaids Other (See Comments)    H/o gastric bypass - avoid NSAIDs  . Tape Hives and Rash    Adhesives in bandaids    MEDICATIONS: Current Outpatient Medications on File Prior to Visit  Medication Sig Dispense Refill  . acetaminophen (TYLENOL) 325 MG tablet Take 650 mg by mouth 2 (two) times daily as needed for moderate pain or headache. Reported on 04/17/2015    . calcium citrate-vitamin D (CITRACAL+D) 315-200 MG-UNIT tablet Take 1 tablet by mouth 3 (three) times daily.    . cholecalciferol (VITAMIN D) 1000 units tablet Take 1,000 Units by mouth daily.    . diphenhydrAMINE (BENADRYL) 25 MG tablet Take 25 mg by mouth daily as needed for allergies or sleep. Reported on 03/06/2015    . glucose blood (ONETOUCH VERIO) test strip 1 each by Other route as needed for other. Reported on 03/06/2015    . hydrochlorothiazide (HYDRODIURIL) 25 MG tablet TAKE 1 TABLET BY MOUTH EVERY DAY 30 tablet 11  . liraglutide (VICTOZA) 18 MG/3ML SOPN Inject 0.2 mLs (1.2 mg total) into the skin every morning. 2 pen 0  . losartan (COZAAR) 100 MG tablet Take 1 tablet by  mouth daily. Reported on 03/06/2015    . Melatonin 5 MG TABS Take 5 mg by mouth at bedtime. Reported on 03/06/2015    . mometasone (NASONEX) 50 MCG/ACT nasal spray Place 1-2 sprays into the nose daily as needed (for allergies.).     [name] Pediatric Multivitamins-Iron (FLINTSTONES PLUS IRON) chewable tablet Chew 1 tablet by mouth 2 (two) times daily.    . Probiotic Product (CVS PROBIOTIC PO) Take 1 capsule by mouth daily. Reported on 03/06/2015    . Propylene Glycol (SYSTANE BALANCE OP) Apply 1-2 drops to eye 3 (three) times daily.    [name] senna-docusate (SENNA PLUS) 8.6-50 MG tablet Take 2 tablets by mouth at bedtime.    . vortioxetine HBr (TRINTELLIX) 20 MG TABS Take 20 mg by mouth at bedtime. 30  tablet 0   No current facility-administered medications on file prior to visit.     PAST MEDICAL HISTORY: Past Medical History:  Diagnosis Date  . Anemia    hx of - during 1st round chemo  . Anxiety   . Arthritis    in fingers, shoulders  . Back pain   . Balance problem   . Breast cancer ([place])   . Breast cancer of upper-outer quadrant of right female breast ([place]) 07/22/2014  . Bunion, right foot   . Clumsiness   . Constipation   . Depression   . Diabetes mellitus without complication ([place])    03+ years  . Eating disorder    binge eating  . Epistaxis 08/26/2014  . Fatty liver   . HCAP (healthcare-associated pneumonia) 12/18/2014  . Headache   . Heart murmur    never had any problems  . History of radiation therapy 04/05/15-05/19/15   right chest wall was treated to 50.4 Gy in 28 fractions, right mastectomy scar was treated to 10 Gy in 5 fractions  . Hyperlipidemia   . Hypertension   . Joint pain   . Multiple food allergies   . Neuromuscular disorder ([place])    diabetic neuropathy  . Obesity   . Obstructive sleep apnea on CPAP    uses cpap nightly  . Ovarian cyst rupture    possible  . Pneumonia   . Pneumonia 11/2014  . Radiation 04/05/15-05/19/15   right chest wall 50.4 Gy, mastectomy scar 10 Gy  . Sleep apnea   . Thyroid nodule 06/2014    PAST SURGICAL HISTORY: Past Surgical History:  Procedure Laterality Date  . APPENDECTOMY    . BIOPSY THYROID Bilateral 06/2014   2 nodules - Benign   . BREAST LUMPECTOMY WITH RADIOACTIVE SEED AND SENTINEL LYMPH NODE BIOPSY Right 12/27/2014   Procedure: BREAST LUMPECTOMY WITH RADIOACTIVE SEED AND SENTINEL LYMPH NODE BIOPSY;  Surgeon: Stark Klein, MD;  Location: [place];  Service: General;  Laterality: Right;  . BREAST REDUCTION SURGERY Bilateral 01/06/2015   Procedure: BILATERAL BREAST REDUCTION, RIGHT ONCOPLASTIC RECONSTRUCTION),LEFT BREAST REDUCTION FOR SYMMETRY;  Surgeon: [name], MD;  Location: [place];   Service: Plastics;  Laterality: Bilateral;  . BREATH TEK H PYLORI N/A 09/15/2012   Procedure: BREATH TEK H PYLORI;  Surgeon: Pedro Earls, MD;  Location: [facility];  Service: General;  Laterality: N/A;  . BUNIONECTOMY Left 12/2013  . CARPAL TUNNEL RELEASE Right   . CARPAL TUNNEL RELEASE Left   . COLONOSCOPY    . DUPUYTREN CONTRACTURE RELEASE    . GASTRIC ROUX-EN-Y N/A 11/02/2012   Procedure: LAPAROSCOPIC ROUX-EN-Y GASTRIC;  Surgeon: Pedro Earls, MD;  Location: [facility];  Service: General;  Laterality: N/A;  . IR GENERIC HISTORICAL  03/18/2016   IR US GUIDE VASC ACCESS LEFT 03/18/2016 Markus Daft, MD [facility]  . IR GENERIC HISTORICAL  03/18/2016   IR FLUORO GUIDE CV LINE LEFT 03/18/2016 Markus Daft, MD [facility]  . LAPAROSCOPIC APPENDECTOMY N/A 07/22/2015   Procedure: APPENDECTOMY LAPAROSCOPIC;  Surgeon: Michael Boston, MD;  Location: [facility];  Service: General;  Laterality: N/A;  . MASTECTOMY Right 02/15/2015   modified  . MODIFIED MASTECTOMY Right 02/15/2015   Procedure: RIGHT MODIFIED RADICAL MASTECTOMY;  Surgeon: Stark Klein, MD;  Location: [place];  Service: General;  Laterality: Right;  . PORT-A-CATH REMOVAL Left 10/10/2015   Procedure: PORT REMOVAL;  Surgeon: Stark Klein, MD;  Location: [place];  Service: General;  Laterality: Left;  . PORTACATH PLACEMENT Left 08/02/2014   Procedure: INSERTION PORT-A-CATH;  Surgeon: Stark Klein, MD;  Location: [place];  Service: General;  Laterality: Left;  . PORTACATH PLACEMENT N/A 11/05/2016   Procedure: INSERTION PORT-A-CATH;  Surgeon: Stark Klein, MD;  Location: [place];  Service: General;  Laterality: N/A;  . ROTATOR CUFF REPAIR Right 2005  . SCAR REVISION Right 12/08/2015   Procedure: COMPLEX REPAIR RIGHT CHEST 10-15CM;  Surgeon: [name], MD;  Location: [place];  Service: Plastics;  Laterality: Right;  . TOE SURGERY  1970s   bone spur  . TRIGGER FINGER RELEASE     x3    SOCIAL  HISTORY: Social History   Tobacco Use  . Smoking status: Never Smoker  . Smokeless tobacco: Never Used  Substance Use Topics  . Alcohol use: Yes    Alcohol/week: 0.0 oz    Comment: social  . Drug use: No    FAMILY HISTORY: Family History  Problem Relation Age of Onset  . CVA Mother   . Heart disease Mother   . Sudden death Mother   . Diabetes Father   . Heart failure Father   . Hypertension Father   . Kidney disease Father   . Obesity Father   . Hypertension Sister   . Diabetes Brother   . Breast cancer Paternal Grandmother   . Diabetes Paternal Uncle     ROS: Review of Systems  Constitutional: Negative for weight loss.  Endo/Heme/Allergies:       Negative hypoglycemia  Psychiatric/Behavioral: Positive for depression. Negative for suicidal ideas.    PHYSICAL EXAM: Blood pressure 111/70, pulse 74, temperature 98.2 F (36.8 C), temperature source Oral, height 5\' 9"  (1.753 m), weight 217 lb (98.4 kg), last menstrual period 10/22/2007, SpO2 99 %. Body mass index is 32.05 kg/m. Physical Exam  Constitutional: She is oriented to person, place, and time. She appears well-developed and well-nourished.  Cardiovascular: Normal rate.  Pulmonary/Chest: Effort normal.  Musculoskeletal: Normal range of motion.  Neurological: She is oriented to person, place, and time.  Skin: Skin is warm and dry.  Psychiatric: She has a normal mood and affect. Her behavior is normal.  Vitals reviewed.   RECENT LABS AND TESTS: BMET    Component Value Date/Time   NA 140 01/07/2017 0758   K 4.1 01/07/2017 0758   CL 107 11/05/2016 0724   CO2 24 01/07/2017 0758   GLUCOSE 134 01/07/2017 0758   BUN 33.6 (H) 01/07/2017 0758   CREATININE 1.5 (H) 01/07/2017 0758   CALCIUM 9.3 01/07/2017 0758   GFRNONAA 41 (L) 11/05/2016 0724   GFRNONAA 55 (L) 02/18/2013 0827   GFRAA 47 (L) 11/05/2016 7371  GFRAA 63 02/18/2013 0827   Lab Results  Component Value Date   HGBA1C 8.2 (H) 11/05/2016    HGBA1C 8.1 (H) 08/09/2016   HGBA1C 6.5 (H) 01/03/2015   HGBA1C 6.9 (H) 02/18/2013   HGBA1C 7.7 (H) 11/02/2012   Lab Results  Component Value Date   INSULIN 18.3 08/09/2016   CBC    Component Value Date/Time   WBC 4.4 01/07/2017 0758   WBC 4.7 11/05/2016 0724   RBC 3.52 (L) 01/07/2017 0758   RBC 3.87 11/05/2016 0724   HGB 10.8 (L) 01/07/2017 0758   HCT 31.9 (L) 01/07/2017 0758   PLT 126 (L) 01/07/2017 0758   MCV 90.8 01/07/2017 0758   MCH 30.8 01/07/2017 0758   MCH 29.5 11/05/2016 0724   MCHC 33.9 01/07/2017 0758   MCHC 33.1 11/05/2016 0724   RDW 14.8 (H) 01/07/2017 0758   LYMPHSABS 1.1 01/07/2017 0758   MONOABS 0.3 01/07/2017 0758   EOSABS 0.1 01/07/2017 0758   EOSABS 0.1 08/09/2016 1229   BASOSABS 0.0 01/07/2017 0758   Iron/TIBC/Ferritin/ %Sat    Component Value Date/Time   IRON 58 02/18/2013 0827   TIBC 305 02/18/2013 0827   FERRITIN 133 10/31/2014 0816   IRONPCTSAT 19 (L) 02/18/2013 0827   Lipid Panel     Component Value Date/Time   CHOL 182 08/09/2016 1229   TRIG 176 (H) 08/09/2016 1229   HDL 62 08/09/2016 1229   CHOLHDL 2.9 02/18/2013 0827   VLDL 23 02/18/2013 0827   LDLCALC 85 08/09/2016 1229   Hepatic Function Panel     Component Value Date/Time   PROT 6.7 01/07/2017 0758   ALBUMIN 3.6 01/07/2017 0758   AST 51 (H) 01/07/2017 0758   ALT 82 (H) 01/07/2017 0758   ALKPHOS 115 01/07/2017 0758   BILITOT 0.72 01/07/2017 0758      Component Value Date/Time   TSH 2.170 08/09/2016 1229    ASSESSMENT AND PLAN: Type 2 diabetes mellitus without complication, with long-term current use of insulin (HCC) - Plan: Insulin Glargine (BASAGLAR KWIKPEN) 100 UNIT/ML SOPN, Insulin Pen Needle (BD PEN NEEDLE NANO U/F) 32G X 4 MM MISC, metFORMIN (GLUCOPHAGE) 500 MG tablet  Other depression - with emotional eating - Plan: buPROPion (WELLBUTRIN SR) 200 MG 12 hr tablet  Class 1 obesity with serious comorbidity and body mass index (BMI) of 32.0 to 32.9 in adult,  unspecified obesity type  PLAN:  Diabetes II on insulin uncontrolled Darlene [name] has been given extensive diabetes education by myself today including ideal fasting and post-prandial blood glucose readings, individual ideal Hgb A1c goals and hypoglycemia prevention. We discussed the importance of good blood sugar control to decrease the likelihood of diabetic complications such as nephropathy, neuropathy, limb loss, blindness, coronary artery disease, and death. We discussed the importance of intensive lifestyle modification including diet, exercise and weight loss as the first line treatment for diabetes. Darlene [name] agrees to [employer], we will refill for 1 month and will refill pen needles. She agrees to continue Metformin 500 mg qd #30 with no refills and follow up at the agreed upon time.  Obesity Lettie is currently in the action stage of change. As such, her goal is to continue with weight loss efforts She has agreed to keep a food journal with 1300 to 1500 calories and 80+ grams of protein daily Darlene [name] has been instructed to work up to a goal of 150 minutes of combined cardio and strengthening exercise per week for weight loss and overall health benefits. We discussed  the following Behavioral Modification Strategies today: no skipping meals, decrease eating out and dealing with family or coworker sabotage  Darlene [name] has agreed to follow up with our clinic in 2 weeks. She was informed of the importance of frequent follow up visits to maximize her success with intensive lifestyle modifications for her multiple health conditions.    OBESITY BEHAVIORAL INTERVENTION VISIT  Today's visit was # 9 out of 22.  Starting weight: 225 lbs Starting date: 08/09/16 Today's weight : 217 lbs Today's date: 01/23/2017 Total lbs lost to date: 8 (Patients must lose 7 lbs in the first 6 months to continue with counseling)   ASK: We discussed the diagnosis of obesity with Darlene [name] today  and Darlene [name] agreed to give [name] permission to discuss obesity behavioral modification therapy today.  ASSESS: Jerrianne has the diagnosis of obesity and her BMI today is 32.03 Darlene [name] is in the action stage of change   ADVISE: Darlene [name] was educated on the multiple health risks of obesity as well as the benefit of weight loss to improve her health. She was advised of the need for long term treatment and the importance of lifestyle modifications.  AGREE: Multiple dietary modification options and treatment options were discussed and  Darlene [name] agreed to keep a food journal with 1300 to 1500 calories and 80+ grams of protein daily We discussed the following Behavioral Modification Strategies today: no skipping meals, decrease eating out and dealing with family or coworker sabotage I, Doreene Nest, am acting as transcriptionist for Dennard Nip, MD  I have reviewed the above documentation for accuracy and completeness, and I agree with the above. -Dennard Nip, MD

## 2017-01-27 NOTE — Progress Notes (Signed)
[facility]  Telephone:([phone]) [phone] Fax:(336) [phone]   ID: GLENDINE SWETZ DOB: November 22, 1950  MR#: [id number]  MBW#:[id number]  Patient Care Team: Vernie Shanks, MD as PCP - General (Family Medicine) Himmelrich, Bryson Ha, RD (Inactive) as Dietitian (Bariatrics) Stark Klein, MD as Consulting Physician (General Surgery) Magrinat, Virgie Dad, MD as Consulting Physician (Oncology) Arloa Koh, MD as Consulting Physician (Radiation Oncology) Mauro Kaufmann, RN as Registered Nurse Rockwell Germany, RN as Registered Nurse Jacelyn Pi, MD as Consulting Physician (Endocrinology) Kem Boroughs, [place] as Nurse Practitioner (Family Medicine) [name], MD as Consulting Physician (Plastic Surgery) Larey Dresser, MD as Consulting Physician (Cardiology) Gery Pray, MD as Consulting Physician (Radiation Oncology) PCP: Vernie Shanks, MD OTHER MD:  CHIEF COMPLAINT:  HER-2 positive, estrogen receptor negative locally recurrent disease  CURRENT TREATMENT:  Trastuzumab q. 28 days  INTERVAL HISTORY:  Darlene [name] returns today for follow-up and treatment of her estrogen receptor negative but HER-2 amplified breast cancer accompanied by her husband.  She continues on trastuzumab, with her most recent echocardiogram on 01/22/2017 shows an ejection fraction of 55-60%. She tolerates this generally well. She notes that she has changed her insurance several times in the last few years. Currently she is on her husband's insurance which they pay $700 per month for insurance and an additional $5,000 per year for the trastuzumab treatments.  Since her last visit, she completed a  PET scan on 01/16/2017 showing: Interval decrease in size and degree of FDG uptake associated with subcentimeter, index right retropectoral lymph node. No new or progressive findings identified. Aortic Atherosclerosis (ICD10-I70.0). Three vessel coronary artery calcifications noted.   REVIEW OF  SYSTEMS: Darlene [name] is concerned that the cancer is still showing up on her pet scan. She notes that she is okay overall. She recently tripped and has pain and slight swelling in her left foot. She notes that she has not been exercising much because of this. She also notes that she is currently out of work because she and her husband went bankrupt. In September, she plans to stop working. She reports that she is basically working to help pay for her health insurance. She denies unusual headaches, visual changes, nausea, vomiting, or dizziness. There has been no unusual cough, phlegm production, or pleurisy. This been no change in bowel or bladder habits. She denies unexplained fatigue or unexplained weight loss, bleeding, rash, or fever. A detailed review of systems was otherwise stable.   BREAST CANCER HISTORY: From the original intake note:  "Darlene [name]" had screening mammography at her gynecologist suggestive of a possible abnormality in the right breast. However right diagnostic mammogram 04/12/2013 at [facility] was negative. More recently however, she noted a lump in her right breast and brought it to her gynecologist's attention. On 07/18/2014 the patient had bilateral diagnostic mammography with tomosynthesis at [facility]. The breast density was category B. In the right breast upper outer quadrant there was an area of focal asymmetry with indistinct margins.[name] Ultrasound was obtained and showed in addition to the mass in question, measuring 1.7 cm, an abnormal-appearing lymph node in the right axillary tail.  On 07/19/2014 the patient underwent biopsy of the right breast massin question as well as a right axillary lymph node. Both were positive for invasive ductal carcinoma, grade 2, estrogen and progesterone receptor negative, with an MIB-1 of 90%, and HER-2 pending. Incidentally the lymph node biopsy showed a lymphocytic inflammatory component but no lymph node architecture.  Her subsequent history is as  detailed  below.  PAST MEDICAL HISTORY: Past Medical History:  Diagnosis Date  . Anemia    hx of - during 1st round chemo  . Anxiety   . Arthritis    in fingers, shoulders  . Back pain   . Balance problem   . Breast cancer ([place])   . Breast cancer of upper-outer quadrant of right female breast ([place]) 07/22/2014  . Bunion, right foot   . Clumsiness   . Constipation   . Depression   . Diabetes mellitus without complication ([place])    16+ years  . Eating disorder    binge eating  . Epistaxis 08/26/2014  . Fatty liver   . HCAP (healthcare-associated pneumonia) 12/18/2014  . Headache   . Heart murmur    never had any problems  . History of radiation therapy 04/05/15-05/19/15   right chest wall was treated to 50.4 Gy in 28 fractions, right mastectomy scar was treated to 10 Gy in 5 fractions  . Hyperlipidemia   . Hypertension   . Joint pain   . Multiple food allergies   . Neuromuscular disorder ([place])    diabetic neuropathy  . Obesity   . Obstructive sleep apnea on CPAP    uses cpap nightly  . Ovarian cyst rupture    possible  . Pneumonia   . Pneumonia 11/2014  . Radiation 04/05/15-05/19/15   right chest wall 50.4 Gy, mastectomy scar 10 Gy  . Sleep apnea   . Thyroid nodule 06/2014    PAST SURGICAL HISTORY: Past Surgical History:  Procedure Laterality Date  . APPENDECTOMY    . BIOPSY THYROID Bilateral 06/2014   2 nodules - Benign   . BREAST LUMPECTOMY WITH RADIOACTIVE SEED AND SENTINEL LYMPH NODE BIOPSY Right 12/27/2014   Procedure: BREAST LUMPECTOMY WITH RADIOACTIVE SEED AND SENTINEL LYMPH NODE BIOPSY;  Surgeon: Stark Klein, MD;  Location: [facility];  Service: General;  Laterality: Right;  . BREAST REDUCTION SURGERY Bilateral 01/06/2015   Procedure: BILATERAL BREAST REDUCTION, RIGHT ONCOPLASTIC RECONSTRUCTION),LEFT BREAST REDUCTION FOR SYMMETRY;  Surgeon: [name], MD;  Location: [place];  Service: Plastics;  Laterality: Bilateral;  . BREATH TEK H PYLORI N/A 09/15/2012    Procedure: BREATH TEK H PYLORI;  Surgeon: Pedro Earls, MD;  Location: [facility];  Service: General;  Laterality: N/A;  . BUNIONECTOMY Left 12/2013  . CARPAL TUNNEL RELEASE Right   . CARPAL TUNNEL RELEASE Left   . COLONOSCOPY    . DUPUYTREN CONTRACTURE RELEASE    . GASTRIC ROUX-EN-Y N/A 11/02/2012   Procedure: LAPAROSCOPIC ROUX-EN-Y GASTRIC;  Surgeon: Pedro Earls, MD;  Location: [facility];  Service: General;  Laterality: N/A;  . IR GENERIC HISTORICAL  03/18/2016   IR US GUIDE VASC ACCESS LEFT 03/18/2016 Markus Daft, MD [facility]  . IR GENERIC HISTORICAL  03/18/2016   IR FLUORO GUIDE CV LINE LEFT 03/18/2016 Markus Daft, MD [facility]  . LAPAROSCOPIC APPENDECTOMY N/A 07/22/2015   Procedure: APPENDECTOMY LAPAROSCOPIC;  Surgeon: Michael Boston, MD;  Location: [facility];  Service: General;  Laterality: N/A;  . MASTECTOMY Right 02/15/2015   modified  . MODIFIED MASTECTOMY Right 02/15/2015   Procedure: RIGHT MODIFIED RADICAL MASTECTOMY;  Surgeon: Stark Klein, MD;  Location: [facility];  Service: General;  Laterality: Right;  . PORT-A-CATH REMOVAL Left 10/10/2015   Procedure: PORT REMOVAL;  Surgeon: Stark Klein, MD;  Location: [place];  Service: General;  Laterality: Left;  . PORTACATH PLACEMENT Left 08/02/2014   Procedure: INSERTION PORT-A-CATH;  Surgeon:  Stark Klein, MD;  Location: [facility];  Service: General;  Laterality: Left;  . PORTACATH PLACEMENT N/A 11/05/2016   Procedure: INSERTION PORT-A-CATH;  Surgeon: Stark Klein, MD;  Location: [facility];  Service: General;  Laterality: N/A;  . ROTATOR CUFF REPAIR Right 2005  . SCAR REVISION Right 12/08/2015   Procedure: COMPLEX REPAIR RIGHT CHEST 10-15CM;  Surgeon: [name], MD;  Location: [place];  Service: Plastics;  Laterality: Right;  . TOE SURGERY  1970s   bone spur  . TRIGGER FINGER RELEASE     x3    FAMILY HISTORY Family History  Problem Relation Age of Onset  . CVA Mother   . Heart disease  Mother   . Sudden death Mother   . Diabetes Father   . Heart failure Father   . Hypertension Father   . Kidney disease Father   . Obesity Father   . Hypertension Sister   . Diabetes Brother   . Breast cancer Paternal Grandmother   . Diabetes Paternal Uncle    the patient's father died from complications of diabetes at age 43. The patient's mother died at age 89 with a subarachnoid hemorrhage. The patient had one brother, one sister. The only breast cancer was the patient's paternal grandmother diagnosed in her 49s. There is no history of ovarian cancer in the family.  GYNECOLOGIC HISTORY:  Patient's last menstrual period was 10/22/2007.  menarche age 53, the patient is GX P0. She went through the change of life in 2011. She did not take hormone replacement.  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 Her husband [name] is a retired Visual merchandiser (he taught at [employer]).  [name] has 2 children from an earlier marriage, Rockey Situ who teaches in [place], and Malaisha Silliman,  who works at the [employer] in in [place]. He has 1 grand son, aged 65. The patient and her husband are not church attender's    ADVANCED DIRECTIVES:  Not in place   HEALTH MAINTENANCE: Social History   Tobacco Use  . Smoking status: Never Smoker  . Smokeless tobacco: Never Used  Substance Use Topics  . Alcohol use: Yes    Alcohol/week: 0.0 oz    Comment: social  . Drug use: No     Colonoscopy:  May 2016  PAP:  Bone density: 07/05/2013 normal, with a T score of 0.4  Lipid panel:  Allergies  Allergen Reactions  . Nsaids Other (See Comments)    H/o gastric bypass - avoid NSAIDs  . Tape Hives and Rash    Adhesives in bandaids    Current Outpatient Medications  Medication Sig Dispense Refill  . acetaminophen (TYLENOL) 325 MG tablet Take 650 mg by mouth 2 (two) times  daily as needed for moderate pain or headache. Reported on 04/17/2015    . buPROPion (WELLBUTRIN SR) 200 MG 12 hr tablet Take 1 tablet (200 mg total) by mouth daily at 12 noon. 30 tablet 0  . calcium citrate-vitamin D (CITRACAL+D) 315-200 MG-UNIT tablet Take 1 tablet by mouth 3 (three) times daily.    . cholecalciferol (VITAMIN D) 1000 units tablet Take 1,000 Units by mouth daily.    . diphenhydrAMINE (BENADRYL) 25 MG tablet Take 25 mg by mouth daily as needed for allergies or sleep. Reported on 03/06/2015    . glucose blood (ONETOUCH VERIO) test strip 1 each  by Other route as needed for other. Reported on 03/06/2015    . hydrochlorothiazide (HYDRODIURIL) 25 MG tablet TAKE 1 TABLET BY MOUTH EVERY DAY 30 tablet 11  . Insulin Glargine (BASAGLAR KWIKPEN) 100 UNIT/ML SOPN Inject 0.25 mLs (25 Units total) into the skin at bedtime. 15 pen 0  . Insulin Pen Needle (BD PEN NEEDLE NANO U/F) 32G X 4 MM MISC 1 Package by Does not apply route daily at 12 noon. 100 each 0  . liraglutide (VICTOZA) 18 MG/3ML SOPN Inject 0.2 mLs (1.2 mg total) into the skin every morning. 2 pen 0  . losartan (COZAAR) 100 MG tablet Take 1 tablet by mouth daily. Reported on 03/06/2015    . Melatonin 5 MG TABS Take 5 mg by mouth at bedtime. Reported on 03/06/2015    . metFORMIN (GLUCOPHAGE) 500 MG tablet Take 1 tablet (500 mg total) by mouth daily. 30 tablet 0  . mometasone (NASONEX) 50 MCG/ACT nasal spray Place 1-2 sprays into the nose daily as needed (for allergies.).     [name] Pediatric Multivitamins-Iron (FLINTSTONES PLUS IRON) chewable tablet Chew 1 tablet by mouth 2 (two) times daily.    . Probiotic Product ([employer] PROBIOTIC PO) Take 1 capsule by mouth daily. Reported on 03/06/2015    . Propylene Glycol (SYSTANE BALANCE OP) Apply 1-2 drops to eye 3 (three) times daily.    [name] senna-docusate (SENNA PLUS) 8.6-50 MG tablet Take 2 tablets by mouth at bedtime.    . vortioxetine HBr (TRINTELLIX) 20 MG TABS Take 20 mg by mouth at bedtime. 30 tablet 0    No current facility-administered medications for this visit.     OBJECTIVE: Relation white woman in no acute distress  Vitals:   01/28/17 0850  BP: (!) 143/72  Pulse: 78  Resp: 20  Temp: 98.6 F (37 C)  SpO2: 100%     Body mass index is 32.3 kg/m.    ECOG FS:1 - Symptomatic but completely ambulatory Filed Weights   01/28/17 0850  Weight: 218 lb 11.2 oz (99.2 kg)   Sclerae unicteric, EOMs intact Oropharynx clear and moist No cervical or supraclavicular adenopathy Lungs no rales or rhonchi Heart regular rate and rhythm Abd soft, nontender, positive bowel sounds MSK no focal spinal tenderness, no upper extremity lymphedema Neuro: nonfocal, well oriented, appropriate affect Breasts: The right breast is status post mastectomy.  There is no evidence of chest wall recurrence.  The left breast is status post reduction mammoplasty.  There are no other findings of concern.  Both axillae are benign.   LAB RESULTS:  CMP     Component Value Date/Time   NA 141 01/28/2017 0817   NA 140 01/07/2017 0758   K 3.9 01/28/2017 0817   K 4.1 01/07/2017 0758   CL 108 01/28/2017 0817   CO2 25 01/28/2017 0817   CO2 24 01/07/2017 0758   GLUCOSE 126 01/28/2017 0817   GLUCOSE 134 01/07/2017 0758   BUN 36 (H) 01/28/2017 0817   BUN 33.6 (H) 01/07/2017 0758   CREATININE 1.60 (H) 01/28/2017 0817   CREATININE 1.5 (H) 01/07/2017 0758   CALCIUM 9.3 01/28/2017 0817   CALCIUM 9.3 01/07/2017 0758   PROT 6.8 01/28/2017 0817   PROT 6.7 01/07/2017 0758   ALBUMIN 3.7 01/28/2017 0817   ALBUMIN 3.6 01/07/2017 0758   AST 34 01/28/2017 0817   AST 51 (H) 01/07/2017 0758   ALT 47 01/28/2017 0817   ALT 82 (H) 01/07/2017 0758   ALKPHOS 118 01/28/2017 0817  ALKPHOS 115 01/07/2017 0758   BILITOT 0.7 01/28/2017 0817   BILITOT 0.72 01/07/2017 0758   GFRNONAA 33 (L) 01/28/2017 0817   GFRNONAA 55 (L) 02/18/2013 0827   GFRAA 38 (L) 01/28/2017 0817   GFRAA 63 02/18/2013 0827    INo results found for:  SPEP, UPEP  Lab Results  Component Value Date   WBC 5.1 01/28/2017   NEUTROABS 3.4 01/28/2017   HGB 10.9 (L) 01/28/2017   HCT 31.9 (L) 01/28/2017   MCV 91.2 01/28/2017   PLT 154 01/28/2017      Chemistry      Component Value Date/Time   NA 141 01/28/2017 0817   NA 140 01/07/2017 0758   K 3.9 01/28/2017 0817   K 4.1 01/07/2017 0758   CL 108 01/28/2017 0817   CO2 25 01/28/2017 0817   CO2 24 01/07/2017 0758   BUN 36 (H) 01/28/2017 0817   BUN 33.6 (H) 01/07/2017 0758   CREATININE 1.60 (H) 01/28/2017 0817   CREATININE 1.5 (H) 01/07/2017 0758      Component Value Date/Time   CALCIUM 9.3 01/28/2017 0817   CALCIUM 9.3 01/07/2017 0758   ALKPHOS 118 01/28/2017 0817   ALKPHOS 115 01/07/2017 0758   AST 34 01/28/2017 0817   AST 51 (H) 01/07/2017 0758   ALT 47 01/28/2017 0817   ALT 82 (H) 01/07/2017 0758   BILITOT 0.7 01/28/2017 0817   BILITOT 0.72 01/07/2017 0758       No results found for: LABCA2  No components found for: LABCA125  No results for input(s): INR in the last 168 hours.  Urinalysis    Component Value Date/Time   COLORURINE YELLOW 07/21/2015 [place] 07/21/2015 2328   LABSPEC 1.020 07/21/2015 2328   PHURINE 6.0 07/21/2015 2328   GLUCOSEU NEGATIVE 07/21/2015 2328   HGBUR TRACE (A) 07/21/2015 [place] 07/21/2015 2328   BILIRUBINUR neg 06/29/2013 [place] 07/21/2015 2328   PROTEINUR 30 (A) 07/21/2015 2328   UROBILINOGEN negative 06/29/2013 1017   NITRITE NEGATIVE 07/21/2015 2328   LEUKOCYTESUR NEGATIVE 07/21/2015 2328    STUDIES: Nm Pet Image Restag (ps) Skull Base To Thigh  Result Date: 01/16/2017 CLINICAL DATA:  Subsequent treatment strategy for limping. EXAM: NUCLEAR MEDICINE PET SKULL BASE TO THIGH TECHNIQUE: 10.7 mCi F-18 FDG was injected intravenously. Full-ring PET imaging was performed from the skull base to thigh after the radiotracer. CT data was obtained and used for attenuation  correction and anatomic localization. FASTING BLOOD GLUCOSE:  Value: 116 mg/dl COMPARISON:  08/19/2016 FINDINGS: NECK: No hypermetabolic lymph nodes in the neck. CHEST: Index right subpectoral lymph node measures 7 mm and has an SUV max of 2.8. Previously this measured 9 mm and had an SUV max of 3.8. Post treatment changes from right mastectomy and right axillary nodal dissection noted and appear unchanged. Mild cardiac enlargement. Aortic atherosclerosis. Calcification in the RCA and LAD and left circumflex coronary artery noted. No hypermetabolic mediastinal or hilar lymph nodes. ABDOMEN/PELVIS: No abnormal hypermetabolic activity within the liver, pancreas, adrenal glands, or spleen. Gallstones. Aortic atherosclerosis. No hypermetabolic lymph nodes in the abdomen or pelvis. SKELETON: No focal hypermetabolic activity to suggest skeletal metastasis. IMPRESSION: 1. Response to therapy. Interval decrease in size and degree of FDG uptake associated with subcentimeter, index right retropectoral lymph node. 2. No new or progressive findings identified. 3. Aortic Atherosclerosis (ICD10-I70.0). Three vessel coronary artery calcifications noted. Electronically Signed   By: Queen Slough.D.  On: 01/16/2017 13:59    ASSESSMENT: 67 y.o. [place] woman status post right breast upper outer quadrant and right axillary lymph node biopsy 07/19/2014 for a cT2  pN1, stage IIB  invasive ductal carcinoma, grade 2, estrogen and progesterone receptor negative, with an MIB-1 of 90%, and HER-2 amplified  (1) neoadjuvant chemotherapy started 08/08/2014, consisting of carboplatin, docetaxel, trastuzumab and pertuzumab given every 3 weeks 6, completed 11/21/2014  (a) breast MRI after initial 3 cycles shows a significant response  (b) breast MRI after 6 cycles shows a complete radiologic response   (2) trastuzumab continued through 10/02/2015 (to end of capecitabine therapy)  (a) echo 02/13/2015 shows an EF of 60-65%  (b)   echo 05/09/2015 shows an ejection fraction of 60-65%  (c) echocardiogram 06/27/2015 shows an ejection fraction of 55-60%.  (d) echocardiogram 01/22/2017 shows an ejection fraction of 55-60%  (3) right lumpectomy with sentinel lymph node biopsy on 12/27/14 showed a residual  ypT2 ypN1a, stage IIB invasive ductal carcinoma, grade 2, with repeat estrogen and progesterone receptors again negative  (4) status post right oncoplastic surgery (with left reduction mammoplasty) 01/06/2015 showing in the right breast multiple foci of intralymphatic carcinoma in random sections  (5)  Right modified radical mastectomy 02/15/2014 showed no residual invasive disease; there was DCIS but margins were negative; all 15 axillary lymph nodes were clear; ypTis ypN0  (a)  The patient has decided against reconstruction.  (6)  Postmastectomy radiation completed 05/18/2015 with capecitabine sensitization  (7) adjuvant capecitabine continued through 09/27/2015  (8) hepatic steatosis with increased fibrosis score (at risk for cirrhosis)  (9) thyroid biopsy (left lobe inferiorly) 2 shows benign tissue 06/28/2015  (10) LOCAL RECURRENCE:  (a) PET scan 12/06/2015 Notes a right retropectoral lymph node measuring 0.9 cm with an SUV max of 5.5  (b) repeat PET scan 03/11/2016 finds the right subpectoral lymph node to now measure 1.3 cm with an SUV max of 11.8.  (c) PET scan 05/07/2016 shows a significant response to T-DM 1   (11) started T-DM1 03/21/2016, completed 8 doses 08/13/2016  (a) PET scan 08/19/2016 shows a good response but measurable residual disease  (b) PET scan on 01/16/2017 shows a continuing response  (12) maintenance trastuzumab started 09/03/2016--changed to every 28 days as of 01/28/2017  (a) repeat echocardiogram 07/08/2016 shows an ejection fraction of 60-65%.  (b) echocardiogram on 10/08/2016 demonstrates LVEF of 55-60% (followed by Dr. Haroldine Laws)  (c) echo [phone] 2019 shows an ejection fraction in  the 55-60% range  (13)  radiation to the area of persistent disease in the right upper chest completed 10/30/2016  PLAN: Darlene [name] has had a good response to the anti-HER-2 treatment and radiation.  We discussed the PET scan in detail.  She would prefer that the second lesion had disappeared as the first 1 did, but so long as it is drinking and becoming less active that is all that is required.  It is also very favorable that if there is no other lesion noted on PET scanning  Accordingly at this point were going to change the trastuzumab to every 28 days.  She will have her next echocardiogram as scheduled by Dr. Aundra Dubin  She will return to see me in approximately 3 months.  We will repeat a PET scan in 6-8 months  I have encouraged her to exercise as soon as her ankle starts feeling a little bit better, a minimum of 2 miles a day of walking  Her insurance situation is extremely complicated and will get  more complicated after she goes on Medicare.  I have given her the name and number of our financial advocate to help her through the maze.  She knows to call for any issues that may develop before her next visit. Magrinat, Virgie Dad, MD  01/28/17 9:25 AM Medical Oncology and Hematology [facility] [address] [place], [place] [address] Tel. [phone]    Fax. [phone]  This document serves as a record of services personally performed by Lurline Del, MD. It was created on his behalf by Sheron Nightingale, a trained medical scribe. The creation of this record is based on the scribe's personal observations and the provider's statements to them.   I have reviewed the above documentation for accuracy and completeness, and I agree with the above.

## 2017-01-28 ENCOUNTER — Inpatient Hospital Stay: Payer: BC Managed Care – PPO

## 2017-01-28 ENCOUNTER — Other Ambulatory Visit (INDEPENDENT_AMBULATORY_CARE_PROVIDER_SITE_OTHER): Payer: Self-pay | Admitting: Family Medicine

## 2017-01-28 ENCOUNTER — Telehealth: Payer: Self-pay | Admitting: Oncology

## 2017-01-28 ENCOUNTER — Inpatient Hospital Stay: Payer: BC Managed Care – PPO | Admitting: Oncology

## 2017-01-28 ENCOUNTER — Inpatient Hospital Stay: Payer: BC Managed Care – PPO | Attending: Oncology

## 2017-01-28 VITALS — BP 143/72 | HR 78 | Temp 98.6°F | Resp 20 | Ht 69.0 in | Wt 218.7 lb

## 2017-01-28 DIAGNOSIS — C773 Secondary and unspecified malignant neoplasm of axilla and upper limb lymph nodes: Principal | ICD-10-CM

## 2017-01-28 DIAGNOSIS — E119 Type 2 diabetes mellitus without complications: Secondary | ICD-10-CM

## 2017-01-28 DIAGNOSIS — C50411 Malignant neoplasm of upper-outer quadrant of right female breast: Secondary | ICD-10-CM

## 2017-01-28 DIAGNOSIS — Z171 Estrogen receptor negative status [ER-]: Secondary | ICD-10-CM | POA: Diagnosis not present

## 2017-01-28 DIAGNOSIS — Z5112 Encounter for antineoplastic immunotherapy: Secondary | ICD-10-CM | POA: Insufficient documentation

## 2017-01-28 DIAGNOSIS — K760 Fatty (change of) liver, not elsewhere classified: Secondary | ICD-10-CM | POA: Insufficient documentation

## 2017-01-28 DIAGNOSIS — C50911 Malignant neoplasm of unspecified site of right female breast: Secondary | ICD-10-CM

## 2017-01-28 DIAGNOSIS — I700 Atherosclerosis of aorta: Secondary | ICD-10-CM | POA: Insufficient documentation

## 2017-01-28 DIAGNOSIS — Z794 Long term (current) use of insulin: Secondary | ICD-10-CM

## 2017-01-28 DIAGNOSIS — Z95828 Presence of other vascular implants and grafts: Secondary | ICD-10-CM

## 2017-01-28 LAB — CBC WITH DIFFERENTIAL/PLATELET
Abs Granulocyte: 3.4 10*3/uL (ref 1.5–6.5)
Basophils Absolute: 0 10*3/uL (ref 0.0–0.1)
Basophils Relative: 0 %
Eosinophils Absolute: 0.1 10*3/uL (ref 0.0–0.5)
Eosinophils Relative: 2 %
HCT: 31.9 % — ABNORMAL LOW (ref 34.8–46.6)
Hemoglobin: 10.9 g/dL — ABNORMAL LOW (ref 11.6–15.9)
Lymphocytes Relative: 22 %
Lymphs Abs: 1.1 10*3/uL (ref 0.9–3.3)
MCH: 31.2 pg (ref 25.1–34.0)
MCHC: 34.2 g/dL (ref 31.5–36.0)
MCV: 91.2 fL (ref 79.5–101.0)
Monocytes Absolute: 0.5 10*3/uL (ref 0.1–0.9)
Monocytes Relative: 9 %
Neutro Abs: 3.4 10*3/uL (ref 1.5–6.5)
Neutrophils Relative %: 67 %
Platelets: 154 10*3/uL (ref 145–400)
RBC: 3.5 MIL/uL — ABNORMAL LOW (ref 3.70–5.45)
RDW: 14.5 % (ref 11.2–16.1)
WBC: 5.1 10*3/uL (ref 3.9–10.3)

## 2017-01-28 LAB — COMPREHENSIVE METABOLIC PANEL
ALT: 47 U/L (ref 0–55)
AST: 34 U/L (ref 5–34)
Albumin: 3.7 g/dL (ref 3.5–5.0)
Alkaline Phosphatase: 118 U/L (ref 40–150)
Anion gap: 8 (ref 3–11)
BUN: 36 mg/dL — ABNORMAL HIGH (ref 7–26)
CO2: 25 mmol/L (ref 22–29)
Calcium: 9.3 mg/dL (ref 8.4–10.4)
Chloride: 108 mmol/L (ref 98–109)
Creatinine, Ser: 1.6 mg/dL — ABNORMAL HIGH (ref 0.60–1.10)
GFR calc Af Amer: 38 mL/min — ABNORMAL LOW (ref >60)
GFR calc non Af Amer: 33 mL/min — ABNORMAL LOW (ref >60)
Glucose, Bld: 126 mg/dL (ref 70–140)
Potassium: 3.9 mmol/L (ref 3.3–4.7)
Sodium: 141 mmol/L (ref 136–145)
Total Bilirubin: 0.7 mg/dL (ref 0.2–1.2)
Total Protein: 6.8 g/dL (ref 6.4–8.3)

## 2017-01-28 MED ORDER — ACETAMINOPHEN 325 MG PO TABS
ORAL_TABLET | ORAL | Status: AC
  Filled 2017-01-28: qty 2

## 2017-01-28 MED ORDER — SODIUM CHLORIDE 0.9 % IV SOLN
Freq: Once | INTRAVENOUS | Status: AC
  Administered 2017-01-28: 10:00:00 via INTRAVENOUS

## 2017-01-28 MED ORDER — ACETAMINOPHEN 325 MG PO TABS
650.0000 mg | ORAL_TABLET | Freq: Once | ORAL | Status: AC
  Administered 2017-01-28: 650 mg via ORAL

## 2017-01-28 MED ORDER — SODIUM CHLORIDE 0.9 % IJ SOLN
10.0000 mL | INTRAMUSCULAR | Status: DC | PRN
  Administered 2017-01-28: 10 mL via INTRAVENOUS
  Filled 2017-01-28: qty 10

## 2017-01-28 MED ORDER — HEPARIN SOD (PORK) LOCK FLUSH 100 UNIT/ML IV SOLN
500.0000 [IU] | Freq: Once | INTRAVENOUS | Status: AC | PRN
  Administered 2017-01-28: 500 [IU]
  Filled 2017-01-28: qty 5

## 2017-01-28 MED ORDER — DIPHENHYDRAMINE HCL 12.5 MG/5ML PO ELIX
12.5000 mg | ORAL_SOLUTION | Freq: Once | ORAL | Status: AC
  Administered 2017-01-28: 12.5 mg via ORAL

## 2017-01-28 MED ORDER — TRASTUZUMAB CHEMO 150 MG IV SOLR
600.0000 mg | Freq: Once | INTRAVENOUS | Status: AC
  Administered 2017-01-28: 600 mg via INTRAVENOUS
  Filled 2017-01-28: qty 28.57

## 2017-01-28 MED ORDER — DIPHENHYDRAMINE HCL 12.5 MG/5ML PO ELIX
ORAL_SOLUTION | ORAL | Status: AC
  Filled 2017-01-28: qty 5

## 2017-01-28 MED ORDER — SODIUM CHLORIDE 0.9% FLUSH
10.0000 mL | INTRAVENOUS | Status: DC | PRN
  Administered 2017-01-28: 10 mL
  Filled 2017-01-28: qty 10

## 2017-01-28 NOTE — Telephone Encounter (Signed)
Patient will receive updated schedule in MyChart of upcoming appointments.

## 2017-01-28 NOTE — Patient Instructions (Signed)
[facility] Discharge Instructions for Patients Receiving Chemotherapy  Today you received the following chemotherapy agents Herceptin  To help prevent nausea and vomiting after your treatment, we encourage you to take your nausea medication as directed   If you develop nausea and vomiting that is not controlled by your nausea medication, call the clinic.   BELOW ARE SYMPTOMS THAT SHOULD BE REPORTED IMMEDIATELY:  *FEVER GREATER THAN 100.5 F  *CHILLS WITH OR WITHOUT FEVER  NAUSEA AND VOMITING THAT IS NOT CONTROLLED WITH YOUR NAUSEA MEDICATION  *UNUSUAL SHORTNESS OF BREATH  *UNUSUAL BRUISING OR BLEEDING  TENDERNESS IN MOUTH AND THROAT WITH OR WITHOUT PRESENCE OF ULCERS  *URINARY PROBLEMS  *BOWEL PROBLEMS  UNUSUAL RASH Items with * indicate a potential emergency and should be followed up as soon as possible.  Feel free to call the clinic should you have any questions or concerns. The clinic phone number is [phone].  Please show the CHEMO ALERT CARD at check-in to the Emergency Department and triage nurse.   

## 2017-02-06 ENCOUNTER — Ambulatory Visit (INDEPENDENT_AMBULATORY_CARE_PROVIDER_SITE_OTHER): Payer: BC Managed Care – PPO | Admitting: Family Medicine

## 2017-02-06 VITALS — BP 130/69 | HR 82 | Temp 97.9°F | Ht 69.0 in | Wt 214.0 lb

## 2017-02-06 DIAGNOSIS — E669 Obesity, unspecified: Secondary | ICD-10-CM

## 2017-02-06 DIAGNOSIS — F3289 Other specified depressive episodes: Secondary | ICD-10-CM

## 2017-02-06 DIAGNOSIS — Z794 Long term (current) use of insulin: Secondary | ICD-10-CM

## 2017-02-06 DIAGNOSIS — E119 Type 2 diabetes mellitus without complications: Secondary | ICD-10-CM

## 2017-02-06 DIAGNOSIS — Z6831 Body mass index (BMI) 31.0-31.9, adult: Secondary | ICD-10-CM

## 2017-02-06 DIAGNOSIS — I10 Essential (primary) hypertension: Secondary | ICD-10-CM

## 2017-02-06 MED ORDER — LIRAGLUTIDE 18 MG/3ML ~~LOC~~ SOPN: 1.2000 mg | PEN_INJECTOR | SUBCUTANEOUS | 0 refills | Status: DC

## 2017-02-06 MED ORDER — INSULIN PEN NEEDLE 32G X 4 MM MISC: 1.0000 | Freq: Every day | 0 refills | Status: DC

## 2017-02-06 NOTE — Progress Notes (Signed)
Office: [phone]  /  Fax: [phone]   HPI:   Chief Complaint: OBESITY Darlene [name] is here to discuss her progress with her obesity treatment plan. She is on the keep a food journal with 1300-1500 calories and 80+ grams of protein daily and is following her eating plan approximately 40 % of the time. She states she is exercising 0 minutes 0 times per week. Darlene [name] is trying to keep track with journaling in her mind and not writing it down. She is struggling with self doubt and stressed about can.  Her weight is 214 lb (97.1 kg) today and has had a weight loss of 3 pounds over a period of 2 week since her last visit. She has lost 11 lbs since starting treatment with [name].  Diabetes II Darlene [name] has a diagnosis of diabetes type II. Keelin states fasting BGs range between 73 and 137, most in 90's. She denies any hypoglycemic episodes. BGs running 90's with diet modifications. Last A1c was 8.2 on 11/05/16. She has been working on intensive lifestyle modifications including diet, exercise, and weight loss to help control her blood glucose levels.  Hypertension Darlene [name] is a 67 y.o. female with hypertension. Mirabella's blood pressure is controlled at this time, at 130/69. She denies chest pain, headaches, or palpitations. She is working weight loss to help control her blood pressure with the goal of decreasing her risk of heart attack and stroke.  Depression with emotional eating behaviors Darlene [name] struggles with childhood teasing and a lot of self consciousness of weight struggle. Darlene [name] struggles with emotional eating and using food for comfort to the extent that it is negatively impacting her health. She often snacks when she is not hungry. Darlene [name] sometimes feels she is out of control and then feels guilty that she made poor food choices. She has been working on behavior modification techniques to help reduce her emotional eating and has been somewhat successful. She shows no sign  of suicidal or homicidal ideations.  Depression screen [facility] 2/9 09/12/2016 08/09/2016 01/01/2016 11/15/2015 10/12/2015  Decreased Interest 0 1 0 0 0  Down, Depressed, Hopeless 0 1 0 0 0  PHQ - 2 Score 0 2 0 0 0  Altered sleeping - 0 - - -  Tired, decreased energy - 1 - - -  Change in appetite - 2 - - -  Feeling bad or failure about yourself  - 2 - - -  Trouble concentrating - 1 - - -  Moving slowly or fidgety/restless - 1 - - -  Suicidal thoughts - 0 - - -  PHQ-9 Score - 9 - - -  Some recent data might be hidden   ALLERGIES: Allergies  Allergen Reactions  . Nsaids Other (See Comments)    H/o gastric bypass - avoid NSAIDs  . Tape Hives and Rash    Adhesives in bandaids    MEDICATIONS: Current Outpatient Medications on File Prior to Visit  Medication Sig Dispense Refill  . acetaminophen (TYLENOL) 325 MG tablet Take 650 mg by mouth 2 (two) times daily as needed for moderate pain or headache. Reported on 04/17/2015    . buPROPion (WELLBUTRIN SR) 200 MG 12 hr tablet Take 1 tablet (200 mg total) by mouth daily at 12 noon. 30 tablet 0  . calcium citrate-vitamin D (CITRACAL+D) 315-200 MG-UNIT tablet Take 1 tablet by mouth 3 (three) times daily.    . cholecalciferol (VITAMIN D) 1000 units tablet Take 1,000 Units by mouth daily.    . diphenhydrAMINE (  BENADRYL) 25 MG tablet Take 25 mg by mouth daily as needed for allergies or sleep. Reported on 03/06/2015    . glucose blood (ONETOUCH VERIO) test strip 1 each by Other route as needed for other. Reported on 03/06/2015    . hydrochlorothiazide (HYDRODIURIL) 25 MG tablet TAKE 1 TABLET BY MOUTH EVERY DAY 30 tablet 11  . Insulin Glargine (BASAGLAR KWIKPEN) 100 UNIT/ML SOPN Inject 0.25 mLs (25 Units total) into the skin at bedtime. (Patient taking differently: Inject 21 Units into the skin at bedtime. ) 15 pen 0  . losartan (COZAAR) 100 MG tablet Take 1 tablet by mouth daily. Reported on 03/06/2015    . Melatonin 5 MG TABS Take 5 mg by mouth at bedtime.  Reported on 03/06/2015    . metFORMIN (GLUCOPHAGE) 500 MG tablet Take 1 tablet (500 mg total) by mouth daily. 30 tablet 0  . mometasone (NASONEX) 50 MCG/ACT nasal spray Place 1-2 sprays into the nose daily as needed (for allergies.).     [name] Pediatric Multivitamins-Iron (FLINTSTONES PLUS IRON) chewable tablet Chew 1 tablet by mouth 2 (two) times daily.    . Probiotic Product ([employer] PROBIOTIC PO) Take 1 capsule by mouth daily. Reported on 03/06/2015    . Propylene Glycol (SYSTANE BALANCE OP) Apply 1-2 drops to eye 3 (three) times daily.    [name] senna-docusate (SENNA PLUS) 8.6-50 MG tablet Take 2 tablets by mouth at bedtime.    . vortioxetine HBr (TRINTELLIX) 20 MG TABS Take 20 mg by mouth at bedtime. 30 tablet 0   No current facility-administered medications on file prior to visit.     PAST MEDICAL HISTORY: Past Medical History:  Diagnosis Date  . Anemia    hx of - during 1st round chemo  . Anxiety   . Arthritis    in fingers, shoulders  . Back pain   . Balance problem   . Breast cancer ([place])   . Breast cancer of upper-outer quadrant of right female breast ([place]) 07/22/2014  . Bunion, right foot   . Clumsiness   . Constipation   . Depression   . Diabetes mellitus without complication ([place])    41+ years  . Eating disorder    binge eating  . Epistaxis 08/26/2014  . Fatty liver   . HCAP (healthcare-associated pneumonia) 12/18/2014  . Headache   . Heart murmur    never had any problems  . History of radiation therapy 04/05/15-05/19/15   right chest wall was treated to 50.4 Gy in 28 fractions, right mastectomy scar was treated to 10 Gy in 5 fractions  . Hyperlipidemia   . Hypertension   . Joint pain   . Multiple food allergies   . Neuromuscular disorder ([place])    diabetic neuropathy  . Obesity   . Obstructive sleep apnea on CPAP    uses cpap nightly  . Ovarian cyst rupture    possible  . Pneumonia   . Pneumonia 11/2014  . Radiation 04/05/15-05/19/15   right chest wall 50.4 Gy, mastectomy  scar 10 Gy  . Sleep apnea   . Thyroid nodule 06/2014    PAST SURGICAL HISTORY: Past Surgical History:  Procedure Laterality Date  . APPENDECTOMY    . BIOPSY THYROID Bilateral 06/2014   2 nodules - Benign   . BREAST LUMPECTOMY WITH RADIOACTIVE SEED AND SENTINEL LYMPH NODE BIOPSY Right 12/27/2014   Procedure: BREAST LUMPECTOMY WITH RADIOACTIVE SEED AND SENTINEL LYMPH NODE BIOPSY;  Surgeon: Stark Klein, MD;  Location: [facility];  Service: General;  Laterality: Right;  . BREAST REDUCTION SURGERY Bilateral 01/06/2015   Procedure: BILATERAL BREAST REDUCTION, RIGHT ONCOPLASTIC RECONSTRUCTION),LEFT BREAST REDUCTION FOR SYMMETRY;  Surgeon: [name], MD;  Location: [facility];  Service: Plastics;  Laterality: Bilateral;  . BREATH TEK H PYLORI N/A 09/15/2012   Procedure: BREATH TEK H PYLORI;  Surgeon: Pedro Earls, MD;  Location: [facility];  Service: General;  Laterality: N/A;  . BUNIONECTOMY Left 12/2013  . CARPAL TUNNEL RELEASE Right   . CARPAL TUNNEL RELEASE Left   . COLONOSCOPY    . DUPUYTREN CONTRACTURE RELEASE    . GASTRIC ROUX-EN-Y N/A 11/02/2012   Procedure: LAPAROSCOPIC ROUX-EN-Y GASTRIC;  Surgeon: Pedro Earls, MD;  Location: [facility];  Service: General;  Laterality: N/A;  . IR GENERIC HISTORICAL  03/18/2016   IR US GUIDE VASC ACCESS LEFT 03/18/2016 Markus Daft, MD [facility]  . IR GENERIC HISTORICAL  03/18/2016   IR FLUORO GUIDE CV LINE LEFT 03/18/2016 Markus Daft, MD [facility]  . LAPAROSCOPIC APPENDECTOMY N/A 07/22/2015   Procedure: APPENDECTOMY LAPAROSCOPIC;  Surgeon: Michael Boston, MD;  Location: [facility];  Service: General;  Laterality: N/A;  . MASTECTOMY Right 02/15/2015   modified  . MODIFIED MASTECTOMY Right 02/15/2015   Procedure: RIGHT MODIFIED RADICAL MASTECTOMY;  Surgeon: Stark Klein, MD;  Location: [facility];  Service: General;  Laterality: Right;  . PORT-A-CATH REMOVAL Left 10/10/2015   Procedure: PORT REMOVAL;  Surgeon: Stark Klein, MD;  Location: [place];  Service: General;  Laterality: Left;  . PORTACATH PLACEMENT Left 08/02/2014   Procedure: INSERTION PORT-A-CATH;  Surgeon: Stark Klein, MD;  Location: [place];  Service: General;  Laterality: Left;  . PORTACATH PLACEMENT N/A 11/05/2016   Procedure: INSERTION PORT-A-CATH;  Surgeon: Stark Klein, MD;  Location: [place] [place];  Service: General;  Laterality: N/A;  . ROTATOR CUFF REPAIR Right 2005  . SCAR REVISION Right 12/08/2015   Procedure: COMPLEX REPAIR RIGHT CHEST 10-15CM;  Surgeon: [name], MD;  Location: [place];  Service: Plastics;  Laterality: Right;  . TOE SURGERY  1970s   bone spur  . TRIGGER FINGER RELEASE     x3    SOCIAL HISTORY: Social History   Tobacco Use  . Smoking status: Never Smoker  . Smokeless tobacco: Never Used  Substance Use Topics  . Alcohol use: Yes    Alcohol/week: 0.0 oz    Comment: social  . Drug use: No    FAMILY HISTORY: Family History  Problem Relation Age of Onset  . CVA Mother   . Heart disease Mother   . Sudden death Mother   . Diabetes Father   . Heart failure Father   . Hypertension Father   . Kidney disease Father   . Obesity Father   . Hypertension Sister   . Diabetes Brother   . Breast cancer Paternal Grandmother   . Diabetes Paternal Uncle     ROS: Review of Systems  Constitutional: Positive for weight loss.  Cardiovascular: Negative for chest pain and palpitations.  Neurological: Negative for headaches.  Endo/Heme/Allergies:       Negative hypoglycemia  Psychiatric/Behavioral: Positive for depression. Negative for suicidal ideas.    PHYSICAL EXAM: Blood pressure 130/69, pulse 82, temperature 97.9 F (36.6 C), temperature source Oral, height 5\' 9"  (1.753 m), weight 214 lb (97.1 kg), last menstrual period 10/22/2007, SpO2 100 %. Body mass index is 31.6 kg/m. Physical Exam  Constitutional: She is oriented to person, place, and  time. She appears well-developed and well-nourished.   Cardiovascular: Normal rate.  Pulmonary/Chest: Effort normal.  Musculoskeletal: Normal range of motion.  Neurological: She is oriented to person, place, and time.  Skin: Skin is warm and dry.  Psychiatric: She has a normal mood and affect. Her behavior is normal.  Vitals reviewed.   RECENT LABS AND TESTS: BMET    Component Value Date/Time   NA 141 01/28/2017 0817   NA 140 01/07/2017 0758   K 3.9 01/28/2017 0817   K 4.1 01/07/2017 0758   CL 108 01/28/2017 0817   CO2 25 01/28/2017 0817   CO2 24 01/07/2017 0758   GLUCOSE 126 01/28/2017 0817   GLUCOSE 134 01/07/2017 0758   BUN 36 (H) 01/28/2017 0817   BUN 33.6 (H) 01/07/2017 0758   CREATININE 1.60 (H) 01/28/2017 0817   CREATININE 1.5 (H) 01/07/2017 0758   CALCIUM 9.3 01/28/2017 0817   CALCIUM 9.3 01/07/2017 0758   GFRNONAA 33 (L) 01/28/2017 0817   GFRNONAA 55 (L) 02/18/2013 0827   GFRAA 38 (L) 01/28/2017 0817   GFRAA 63 02/18/2013 0827   Lab Results  Component Value Date   HGBA1C 8.2 (H) 11/05/2016   HGBA1C 8.1 (H) 08/09/2016   HGBA1C 6.5 (H) 01/03/2015   HGBA1C 6.9 (H) 02/18/2013   HGBA1C 7.7 (H) 11/02/2012   Lab Results  Component Value Date   INSULIN 18.3 08/09/2016   CBC    Component Value Date/Time   WBC 5.1 01/28/2017 0817   RBC 3.50 (L) 01/28/2017 0817   HGB 10.9 (L) 01/28/2017 0817   HGB 10.8 (L) 01/07/2017 0758   HCT 31.9 (L) 01/28/2017 0817   HCT 31.9 (L) 01/07/2017 0758   PLT 154 01/28/2017 0817   PLT 126 (L) 01/07/2017 0758   MCV 91.2 01/28/2017 0817   MCV 90.8 01/07/2017 0758   MCH 31.2 01/28/2017 0817   MCHC 34.2 01/28/2017 0817   RDW 14.5 01/28/2017 0817   RDW 14.8 (H) 01/07/2017 0758   LYMPHSABS 1.1 01/28/2017 0817   LYMPHSABS 1.1 01/07/2017 0758   MONOABS 0.5 01/28/2017 0817   MONOABS 0.3 01/07/2017 0758   EOSABS 0.1 01/28/2017 0817   EOSABS 0.1 01/07/2017 0758   EOSABS 0.1 08/09/2016 1229   BASOSABS 0.0 01/28/2017 0817   BASOSABS 0.0 01/07/2017 0758   Iron/TIBC/Ferritin/ %Sat      Component Value Date/Time   IRON 58 02/18/2013 0827   TIBC 305 02/18/2013 0827   FERRITIN 133 10/31/2014 0816   IRONPCTSAT 19 (L) 02/18/2013 0827   Lipid Panel     Component Value Date/Time   CHOL 182 08/09/2016 1229   TRIG 176 (H) 08/09/2016 1229   HDL 62 08/09/2016 1229   CHOLHDL 2.9 02/18/2013 0827   VLDL 23 02/18/2013 0827   LDLCALC 85 08/09/2016 1229   Hepatic Function Panel     Component Value Date/Time   PROT 6.8 01/28/2017 0817   PROT 6.7 01/07/2017 0758   ALBUMIN 3.7 01/28/2017 0817   ALBUMIN 3.6 01/07/2017 0758   AST 34 01/28/2017 0817   AST 51 (H) 01/07/2017 0758   ALT 47 01/28/2017 0817   ALT 82 (H) 01/07/2017 0758   ALKPHOS 118 01/28/2017 0817   ALKPHOS 115 01/07/2017 0758   BILITOT 0.7 01/28/2017 0817   BILITOT 0.72 01/07/2017 0758      Component Value Date/Time   TSH 2.170 08/09/2016 1229    ASSESSMENT AND PLAN: Type 2 diabetes mellitus without complication, with long-term current use of insulin ([place]) - Plan: liraglutide (  VICTOZA) 18 MG/3ML SOPN, Insulin Pen Needle (BD PEN NEEDLE NANO U/F) 32G X 4 MM MISC  Essential hypertension  Other depression - with emotional eating  Class 1 obesity with serious comorbidity and body mass index (BMI) of 31.0 to 31.9 in adult, unspecified obesity type  PLAN:  Diabetes II Darlene [name] has been given extensive diabetes education by myself today including ideal fasting and post-prandial blood glucose readings, individual ideal Hgb A1c goals and hypoglycemia prevention. We discussed the importance of good blood sugar control to decrease the likelihood of diabetic complications such as nephropathy, neuropathy, limb loss, blindness, coronary artery disease, and death. We discussed the importance of intensive lifestyle modification including diet, exercise and weight loss as the first line treatment for diabetes. Darlene [name] agrees to decrease Lantus to 21 units at bedtime and she agrees to continue Victoza 1.2 mg q AM and we  will refill for 1 month and we will refill pen needles for 1 month. Darlene [name] agrees to follow up with our clinic in 2 to 3 weeks.  Hypertension We discussed sodium restriction, working on healthy weight loss, and a regular exercise program as the means to achieve improved blood pressure control. Darlene [name] agreed with this plan and agreed to follow up as directed. We will continue to monitor her blood pressure as well as her progress with the above lifestyle modifications. She will continue her medications as prescribed and will watch for signs of hypotension as she continues her lifestyle modifications. Darlene [name] agrees to follow up with our clinic in 2 to 3 weeks.  Depression with Emotional Eating Behaviors We discussed behavior modification techniques today to help Darlene [name] deal with her emotional eating and depression. Darlene [name] agrees to continue taking  Wellbutrin SR 200 mg qd and she agrees to follow up with our clinic in 2 to 3 weeks.  Obesity Darlene [name] is currently in the action stage of change. As such, her goal is to continue with weight loss efforts She has agreed to keep a food journal with 1300-1500 calories and 80+ grams of protein daily Darlene [name] has been instructed to work up to a goal of 150 minutes of combined cardio and strengthening exercise per week for weight loss and overall health benefits. We discussed the following Behavioral Modification Strategies today: increasing lean protein intake and keep a strict food journal   Darlene [name] has agreed to follow up with our clinic in 2 to 3 weeks. She was informed of the importance of frequent follow up visits to maximize her success with intensive lifestyle modifications for her multiple health conditions.   OBESITY BEHAVIORAL INTERVENTION VISIT  Today's visit was # 11 out of 22.  Starting weight: 225 lbs Starting date: 08/09/16 Today's weight : 214 lbs  Today's date: 02/06/2017 Total lbs lost to date: 11 (Patients must lose 7 lbs  in the first 6 months to continue with counseling)   ASK: We discussed the diagnosis of obesity with Darlene [name] today and Darlene [name] agreed to give [name] permission to discuss obesity behavioral modification therapy today.  ASSESS: Darlene [name] has the diagnosis of obesity and her BMI today is 31.59 Darlene [name] is in the action stage of change   ADVISE: Darlene [name] was educated on the multiple health risks of obesity as well as the benefit of weight loss to improve her health. She was advised of the need for long term treatment and the importance of lifestyle modifications.  AGREE: Multiple dietary modification options and treatment options were discussed and  Darlene [name] agreed to the above  obesity treatment plan.  I, [name], am acting as transcriptionist for Dennard Nip, MD  I have reviewed the above documentation for accuracy and completeness, and I agree with the above. -Dennard Nip, MD

## 2017-02-07 LAB — LIPID PANEL
Cholesterol: 164 (ref 0–200)
HDL: 59 (ref 35–70)
LDL Cholesterol: 83
LDl/HDL Ratio: 2.8
Triglycerides: 114 (ref 40–160)

## 2017-02-07 LAB — TSH: TSH: 1.54 (ref 0.41–5.90)

## 2017-02-07 LAB — VITAMIN B12: Vitamin B-12: 1473

## 2017-02-19 ENCOUNTER — Encounter (INDEPENDENT_AMBULATORY_CARE_PROVIDER_SITE_OTHER): Payer: Self-pay | Admitting: Family Medicine

## 2017-02-21 ENCOUNTER — Other Ambulatory Visit (INDEPENDENT_AMBULATORY_CARE_PROVIDER_SITE_OTHER): Payer: Self-pay | Admitting: Family Medicine

## 2017-02-21 DIAGNOSIS — Z794 Long term (current) use of insulin: Secondary | ICD-10-CM

## 2017-02-21 DIAGNOSIS — E119 Type 2 diabetes mellitus without complications: Secondary | ICD-10-CM

## 2017-02-24 ENCOUNTER — Encounter (INDEPENDENT_AMBULATORY_CARE_PROVIDER_SITE_OTHER): Payer: BC Managed Care – PPO | Admitting: Family Medicine

## 2017-02-24 ENCOUNTER — Encounter (INDEPENDENT_AMBULATORY_CARE_PROVIDER_SITE_OTHER): Payer: Self-pay | Admitting: Family Medicine

## 2017-02-24 ENCOUNTER — Ambulatory Visit (INDEPENDENT_AMBULATORY_CARE_PROVIDER_SITE_OTHER): Payer: BC Managed Care – PPO | Admitting: Family Medicine

## 2017-02-24 ENCOUNTER — Encounter (INDEPENDENT_AMBULATORY_CARE_PROVIDER_SITE_OTHER): Payer: Self-pay

## 2017-02-24 DIAGNOSIS — Z6831 Body mass index (BMI) 31.0-31.9, adult: Secondary | ICD-10-CM | POA: Diagnosis not present

## 2017-02-24 DIAGNOSIS — F3289 Other specified depressive episodes: Secondary | ICD-10-CM | POA: Diagnosis not present

## 2017-02-24 DIAGNOSIS — E119 Type 2 diabetes mellitus without complications: Secondary | ICD-10-CM | POA: Diagnosis not present

## 2017-02-24 DIAGNOSIS — Z9189 Other specified personal risk factors, not elsewhere classified: Secondary | ICD-10-CM | POA: Diagnosis not present

## 2017-02-24 DIAGNOSIS — E669 Obesity, unspecified: Secondary | ICD-10-CM

## 2017-02-24 DIAGNOSIS — E559 Vitamin D deficiency, unspecified: Secondary | ICD-10-CM | POA: Insufficient documentation

## 2017-02-24 MED ORDER — VORTIOXETINE HBR 20 MG PO TABS: 20.0000 mg | ORAL_TABLET | Freq: Every day | ORAL | 0 refills | Status: DC

## 2017-02-24 MED ORDER — METFORMIN HCL 500 MG PO TABS: 500.0000 mg | ORAL_TABLET | Freq: Every day | ORAL | 0 refills | Status: DC

## 2017-02-24 MED ORDER — BUPROPION HCL ER (SR) 200 MG PO TB12: 200.0000 mg | ORAL_TABLET | Freq: Every day | ORAL | 0 refills | Status: DC

## 2017-02-25 ENCOUNTER — Inpatient Hospital Stay: Payer: BC Managed Care – PPO

## 2017-02-25 ENCOUNTER — Inpatient Hospital Stay: Payer: BC Managed Care – PPO | Attending: Oncology

## 2017-02-25 VITALS — BP 124/52 | HR 66 | Temp 98.0°F | Resp 20 | Ht 69.0 in | Wt 213.0 lb

## 2017-02-25 DIAGNOSIS — C50411 Malignant neoplasm of upper-outer quadrant of right female breast: Secondary | ICD-10-CM

## 2017-02-25 DIAGNOSIS — Z5112 Encounter for antineoplastic immunotherapy: Secondary | ICD-10-CM | POA: Diagnosis not present

## 2017-02-25 DIAGNOSIS — C773 Secondary and unspecified malignant neoplasm of axilla and upper limb lymph nodes: Secondary | ICD-10-CM

## 2017-02-25 DIAGNOSIS — C7989 Secondary malignant neoplasm of other specified sites: Secondary | ICD-10-CM | POA: Diagnosis not present

## 2017-02-25 DIAGNOSIS — Z171 Estrogen receptor negative status [ER-]: Secondary | ICD-10-CM

## 2017-02-25 DIAGNOSIS — C50911 Malignant neoplasm of unspecified site of right female breast: Secondary | ICD-10-CM

## 2017-02-25 LAB — CBC WITH DIFFERENTIAL/PLATELET
Basophils Absolute: 0 10*3/uL (ref 0.0–0.1)
Basophils Relative: 0 %
Eosinophils Absolute: 0.2 10*3/uL (ref 0.0–0.5)
Eosinophils Relative: 3 %
HCT: 32.7 % — ABNORMAL LOW (ref 34.8–46.6)
Hemoglobin: 11.1 g/dL — ABNORMAL LOW (ref 11.6–15.9)
Lymphocytes Relative: 17 %
Lymphs Abs: 1 10*3/uL (ref 0.9–3.3)
MCH: 31.4 pg (ref 25.1–34.0)
MCHC: 33.9 g/dL (ref 31.5–36.0)
MCV: 92.4 fL (ref 79.5–101.0)
Monocytes Absolute: 0.5 10*3/uL (ref 0.1–0.9)
Monocytes Relative: 8 %
Neutro Abs: 4.4 10*3/uL (ref 1.5–6.5)
Neutrophils Relative %: 72 %
Platelets: 150 10*3/uL (ref 145–400)
RBC: 3.54 MIL/uL — ABNORMAL LOW (ref 3.70–5.45)
RDW: 13.9 % (ref 11.2–14.5)
WBC: 6.1 10*3/uL (ref 3.9–10.3)

## 2017-02-25 LAB — COMPREHENSIVE METABOLIC PANEL
ALT: 49 U/L (ref 0–55)
AST: 40 U/L — ABNORMAL HIGH (ref 5–34)
Albumin: 3.9 g/dL (ref 3.5–5.0)
Alkaline Phosphatase: 104 U/L (ref 40–150)
Anion gap: 9 (ref 3–11)
BUN: 41 mg/dL — ABNORMAL HIGH (ref 7–26)
CO2: 21 mmol/L — ABNORMAL LOW (ref 22–29)
Calcium: 9.2 mg/dL (ref 8.4–10.4)
Chloride: 110 mmol/L — ABNORMAL HIGH (ref 98–109)
Creatinine, Ser: 1.65 mg/dL — ABNORMAL HIGH (ref 0.60–1.10)
GFR calc Af Amer: 37 mL/min — ABNORMAL LOW (ref >60)
GFR calc non Af Amer: 32 mL/min — ABNORMAL LOW (ref >60)
Glucose, Bld: 160 mg/dL — ABNORMAL HIGH (ref 70–140)
Potassium: 4 mmol/L (ref 3.5–5.1)
Sodium: 140 mmol/L (ref 136–145)
Total Bilirubin: 0.6 mg/dL (ref 0.2–1.2)
Total Protein: 6.9 g/dL (ref 6.4–8.3)

## 2017-02-25 MED ORDER — TRASTUZUMAB CHEMO 150 MG IV SOLR
600.0000 mg | Freq: Once | INTRAVENOUS | Status: AC
  Administered 2017-02-25: 600 mg via INTRAVENOUS
  Filled 2017-02-25: qty 28.57

## 2017-02-25 MED ORDER — SODIUM CHLORIDE 0.9 % IV SOLN
Freq: Once | INTRAVENOUS | Status: AC
  Administered 2017-02-25: 09:00:00 via INTRAVENOUS

## 2017-02-25 MED ORDER — ACETAMINOPHEN 325 MG PO TABS
ORAL_TABLET | ORAL | Status: AC
  Filled 2017-02-25: qty 2

## 2017-02-25 MED ORDER — SODIUM CHLORIDE 0.9% FLUSH
10.0000 mL | INTRAVENOUS | Status: DC | PRN
  Administered 2017-02-25: 10 mL
  Filled 2017-02-25: qty 10

## 2017-02-25 MED ORDER — DIPHENHYDRAMINE HCL 12.5 MG/5ML PO ELIX
12.5000 mg | ORAL_SOLUTION | Freq: Once | ORAL | Status: AC
  Administered 2017-02-25: 12.5 mg via ORAL

## 2017-02-25 MED ORDER — ACETAMINOPHEN 325 MG PO TABS
650.0000 mg | ORAL_TABLET | Freq: Once | ORAL | Status: AC
  Administered 2017-02-25: 650 mg via ORAL

## 2017-02-25 MED ORDER — HEPARIN SOD (PORK) LOCK FLUSH 100 UNIT/ML IV SOLN
500.0000 [IU] | Freq: Once | INTRAVENOUS | Status: AC | PRN
  Administered 2017-02-25: 500 [IU]
  Filled 2017-02-25: qty 5

## 2017-02-25 MED ORDER — DIPHENHYDRAMINE HCL 12.5 MG/5ML PO ELIX
ORAL_SOLUTION | ORAL | Status: AC
  Filled 2017-02-25: qty 5

## 2017-02-25 NOTE — Progress Notes (Signed)
Dr. Jana Hakim okay to tx with ctn 1.65.

## 2017-02-25 NOTE — Patient Instructions (Signed)
[facility] Discharge Instructions for Patients Receiving Chemotherapy  Today you received the following chemotherapy agents trastuzumab (Herceptin)  To help prevent nausea and vomiting after your treatment, we encourage you to take your nausea medication as directed  If you develop nausea and vomiting that is not controlled by your nausea medication, call the clinic.   BELOW ARE SYMPTOMS THAT SHOULD BE REPORTED IMMEDIATELY:  *FEVER GREATER THAN 100.5 F  *CHILLS WITH OR WITHOUT FEVER  NAUSEA AND VOMITING THAT IS NOT CONTROLLED WITH YOUR NAUSEA MEDICATION  *UNUSUAL SHORTNESS OF BREATH  *UNUSUAL BRUISING OR BLEEDING  TENDERNESS IN MOUTH AND THROAT WITH OR WITHOUT PRESENCE OF ULCERS  *URINARY PROBLEMS  *BOWEL PROBLEMS  UNUSUAL RASH Items with * indicate a potential emergency and should be followed up as soon as possible.  Feel free to call the clinic should you have any questions or concerns. The clinic phone number is [phone].  Please show the CHEMO ALERT CARD at check-in to the Emergency Department and triage nurse.   

## 2017-02-25 NOTE — Progress Notes (Signed)
Office: [phone]  /  Fax: [phone]   HPI:   Chief Complaint: OBESITY Ameena is here to discuss her progress with her obesity treatment plan. She is on the keep a food journal with 1300 to 1500 calories and 80+ grams of protein daily and is following her eating plan approximately 50 % of the time. She states she is exercising 0 minutes 0 times per week. Tameshia just opened her instant pot yesterday and wants to try new recipes. The hard part for her, is snacking and hiding it from her husband. The worst time is when she is bored at home. Henslee got labs done in January at [facility], but doesn't know which labs. Her weight is 213 lb (96.6 kg) today and has had a weight loss of 1 pound over a period of 3 weeks since her last visit. She has lost 12 lbs since starting treatment with [name].  Diabetes II Luvia has a diagnosis of diabetes type II. Justina states her fasting blood sugar is running in the low 100's (1 low in the 60's this morning) and denies any hypoglycemic episodes, except this morning. She has been working on intensive lifestyle modifications including diet, exercise, and weight loss to help control her blood glucose levels.  Vitamin D deficiency Eria has a diagnosis of vitamin D deficiency. She is on OTC vit D 1,000 IU daily and her last level was in July. She admits fatigue and denies nausea, vomiting or muscle weakness.   Ref. Range 08/09/2016 12:29  Vitamin D, 25-Hydroxy Latest Ref Range: 30.0 - 100.0 ng/mL 35.4   At risk for osteopenia and osteoporosis Reham is at higher risk of osteopenia and osteoporosis due to vitamin D deficiency.   Depression with emotional eating behaviors Owen feels more hopeful today. She is starting chemo tomorrow. Mane struggles with emotional eating and using food for comfort to the extent that it is negatively impacting her health. She often snacks when she is not hungry. Betha sometimes feels she is out of control and  then feels guilty that she made poor food choices. She has been working on behavior modification techniques to help reduce her emotional eating and has been somewhat successful. She shows no sign of suicidal or homicidal ideations.  Depression screen [facility] 2/9 09/12/2016 08/09/2016 01/01/2016 11/15/2015 10/12/2015  Decreased Interest 0 1 0 0 0  Down, Depressed, Hopeless 0 1 0 0 0  PHQ - 2 Score 0 2 0 0 0  Altered sleeping - 0 - - -  Tired, decreased energy - 1 - - -  Change in appetite - 2 - - -  Feeling bad or failure about yourself  - 2 - - -  Trouble concentrating - 1 - - -  Moving slowly or fidgety/restless - 1 - - -  Suicidal thoughts - 0 - - -  PHQ-9 Score - 9 - - -  Some recent data might be hidden     ALLERGIES: Allergies  Allergen Reactions  . Nsaids Other (See Comments)    H/o gastric bypass - avoid NSAIDs  . Tape Hives and Rash    Adhesives in bandaids    MEDICATIONS: Current Outpatient Medications on File Prior to Visit  Medication Sig Dispense Refill  . acetaminophen (TYLENOL) 325 MG tablet Take 650 mg by mouth 2 (two) times daily as needed for moderate pain or headache. Reported on 04/17/2015    . calcium citrate-vitamin D (CITRACAL+D) 315-200 MG-UNIT tablet Take 1 tablet by mouth 3 (three) times  daily.    . cholecalciferol (VITAMIN D) 1000 units tablet Take 1,000 Units by mouth daily.    . diphenhydrAMINE (BENADRYL) 25 MG tablet Take 25 mg by mouth daily as needed for allergies or sleep. Reported on 03/06/2015    . glucose blood (ONETOUCH VERIO) test strip 1 each by Other route as needed for other. Reported on 03/06/2015    . hydrochlorothiazide (HYDRODIURIL) 25 MG tablet TAKE 1 TABLET BY MOUTH EVERY DAY 30 tablet 11  . Insulin Glargine (BASAGLAR KWIKPEN) 100 UNIT/ML SOPN Inject 0.25 mLs (25 Units total) into the skin at bedtime. (Patient taking differently: Inject 21 Units into the skin at bedtime. ) 15 pen 0  . Insulin Pen Needle (BD PEN NEEDLE NANO U/F) 32G X 4 MM MISC  1 Package by Does not apply route daily at 12 noon. 100 each 0  . liraglutide (VICTOZA) 18 MG/3ML SOPN Inject 0.2 mLs (1.2 mg total) into the skin every morning. 2 pen 0  . losartan (COZAAR) 100 MG tablet Take 1 tablet by mouth daily. Reported on 03/06/2015    . Melatonin 5 MG TABS Take 5 mg by mouth at bedtime. Reported on 03/06/2015    . mometasone (NASONEX) 50 MCG/ACT nasal spray Place 1-2 sprays into the nose daily as needed (for allergies.).     [name] Pediatric Multivitamins-Iron (FLINTSTONES PLUS IRON) chewable tablet Chew 1 tablet by mouth 2 (two) times daily.    . Probiotic Product ([employer] PROBIOTIC PO) Take 1 capsule by mouth daily. Reported on 03/06/2015    . Propylene Glycol (SYSTANE BALANCE OP) Apply 1-2 drops to eye 3 (three) times daily.    [name] senna-docusate (SENNA PLUS) 8.6-50 MG tablet Take 2 tablets by mouth at bedtime.     No current facility-administered medications on file prior to visit.     PAST MEDICAL HISTORY: Past Medical History:  Diagnosis Date  . Anemia    hx of - during 1st round chemo  . Anxiety   . Arthritis    in fingers, shoulders  . Back pain   . Balance problem   . Breast cancer ([place])   . Breast cancer of upper-outer quadrant of right female breast ([place]) 07/22/2014  . Bunion, right foot   . Clumsiness   . Constipation   . Depression   . Diabetes mellitus without complication ([place])    32+ years  . Eating disorder    binge eating  . Epistaxis 08/26/2014  . Fatty liver   . HCAP (healthcare-associated pneumonia) 12/18/2014  . Headache   . Heart murmur    never had any problems  . History of radiation therapy 04/05/15-05/19/15   right chest wall was treated to 50.4 Gy in 28 fractions, right mastectomy scar was treated to 10 Gy in 5 fractions  . Hyperlipidemia   . Hypertension   . Joint pain   . Multiple food allergies   . Neuromuscular disorder ([place])    diabetic neuropathy  . Obesity   . Obstructive sleep apnea on CPAP    uses cpap nightly  . Ovarian  cyst rupture    possible  . Pneumonia   . Pneumonia 11/2014  . Radiation 04/05/15-05/19/15   right chest wall 50.4 Gy, mastectomy scar 10 Gy  . Sleep apnea   . Thyroid nodule 06/2014    PAST SURGICAL HISTORY: Past Surgical History:  Procedure Laterality Date  . APPENDECTOMY    . BIOPSY THYROID Bilateral 06/2014   2 nodules - Benign   .  BREAST LUMPECTOMY WITH RADIOACTIVE SEED AND SENTINEL LYMPH NODE BIOPSY Right 12/27/2014   Procedure: BREAST LUMPECTOMY WITH RADIOACTIVE SEED AND SENTINEL LYMPH NODE BIOPSY;  Surgeon: Stark Klein, MD;  Location: [facility];  Service: General;  Laterality: Right;  . BREAST REDUCTION SURGERY Bilateral 01/06/2015   Procedure: BILATERAL BREAST REDUCTION, RIGHT ONCOPLASTIC RECONSTRUCTION),LEFT BREAST REDUCTION FOR SYMMETRY;  Surgeon: [name], MD;  Location: [facility];  Service: Plastics;  Laterality: Bilateral;  . BREATH TEK H PYLORI N/A 09/15/2012   Procedure: BREATH TEK H PYLORI;  Surgeon: Pedro Earls, MD;  Location: [facility];  Service: General;  Laterality: N/A;  . BUNIONECTOMY Left 12/2013  . CARPAL TUNNEL RELEASE Right   . CARPAL TUNNEL RELEASE Left   . COLONOSCOPY    . DUPUYTREN CONTRACTURE RELEASE    . GASTRIC ROUX-EN-Y N/A 11/02/2012   Procedure: LAPAROSCOPIC ROUX-EN-Y GASTRIC;  Surgeon: Pedro Earls, MD;  Location: [facility];  Service: General;  Laterality: N/A;  . IR GENERIC HISTORICAL  03/18/2016   IR US GUIDE VASC ACCESS LEFT 03/18/2016 Markus Daft, MD [facility]  . IR GENERIC HISTORICAL  03/18/2016   IR FLUORO GUIDE CV LINE LEFT 03/18/2016 Markus Daft, MD [facility]  . LAPAROSCOPIC APPENDECTOMY N/A 07/22/2015   Procedure: APPENDECTOMY LAPAROSCOPIC;  Surgeon: Michael Boston, MD;  Location: [facility];  Service: General;  Laterality: N/A;  . MASTECTOMY Right 02/15/2015   modified  . MODIFIED MASTECTOMY Right 02/15/2015   Procedure: RIGHT MODIFIED RADICAL MASTECTOMY;  Surgeon: Stark Klein, MD;  Location: [facility];  Service:  General;  Laterality: Right;  . PORT-A-CATH REMOVAL Left 10/10/2015   Procedure: PORT REMOVAL;  Surgeon: Stark Klein, MD;  Location: [facility];  Service: General;  Laterality: Left;  . PORTACATH PLACEMENT Left 08/02/2014   Procedure: INSERTION PORT-A-CATH;  Surgeon: Stark Klein, MD;  Location: [facility];  Service: General;  Laterality: Left;  . PORTACATH PLACEMENT N/A 11/05/2016   Procedure: INSERTION PORT-A-CATH;  Surgeon: Stark Klein, MD;  Location: [place];  Service: General;  Laterality: N/A;  . ROTATOR CUFF REPAIR Right 2005  . SCAR REVISION Right 12/08/2015   Procedure: COMPLEX REPAIR RIGHT CHEST 10-15CM;  Surgeon: [name], MD;  Location: [facility];  Service: Plastics;  Laterality: Right;  . TOE SURGERY  1970s   bone spur  . TRIGGER FINGER RELEASE     x3    SOCIAL HISTORY: Social History   Tobacco Use  . Smoking status: Never Smoker  . Smokeless tobacco: Never Used  Substance Use Topics  . Alcohol use: Yes    Alcohol/week: 0.0 oz    Comment: social  . Drug use: No    FAMILY HISTORY: Family History  Problem Relation Age of Onset  . CVA Mother   . Heart disease Mother   . Sudden death Mother   . Diabetes Father   . Heart failure Father   . Hypertension Father   . Kidney disease Father   . Obesity Father   . Hypertension Sister   . Diabetes Brother   . Breast cancer Paternal Grandmother   . Diabetes Paternal Uncle     ROS: Review of Systems  Constitutional: Positive for malaise/fatigue and weight loss.  Gastrointestinal: Negative for nausea and vomiting.  Musculoskeletal:       Negative muscle weakness  Endo/Heme/Allergies:       Hypoglycemia  Psychiatric/Behavioral: Positive for depression. Negative for suicidal ideas.    PHYSICAL EXAM: Last menstrual period 10/22/2007. There is  no height or weight on file to calculate BMI. Physical Exam  Constitutional: She is oriented to person, place, and time. She appears  well-developed and well-nourished.  Cardiovascular: Normal rate.  Pulmonary/Chest: Effort normal.  Musculoskeletal: Normal range of motion.  Neurological: She is oriented to person, place, and time.  Skin: Skin is warm and dry.  Psychiatric: She has a normal mood and affect. Her behavior is normal.  Vitals reviewed.   RECENT LABS AND TESTS: BMET    Component Value Date/Time   NA 141 01/28/2017 0817   NA 140 01/07/2017 0758   K 3.9 01/28/2017 0817   K 4.1 01/07/2017 0758   CL 108 01/28/2017 0817   CO2 25 01/28/2017 0817   CO2 24 01/07/2017 0758   GLUCOSE 126 01/28/2017 0817   GLUCOSE 134 01/07/2017 0758   BUN 36 (H) 01/28/2017 0817   BUN 33.6 (H) 01/07/2017 0758   CREATININE 1.60 (H) 01/28/2017 0817   CREATININE 1.5 (H) 01/07/2017 0758   CALCIUM 9.3 01/28/2017 0817   CALCIUM 9.3 01/07/2017 0758   GFRNONAA 33 (L) 01/28/2017 0817   GFRNONAA 55 (L) 02/18/2013 0827   GFRAA 38 (L) 01/28/2017 0817   GFRAA 63 02/18/2013 0827   Lab Results  Component Value Date   HGBA1C 8.2 (H) 11/05/2016   HGBA1C 8.1 (H) 08/09/2016   HGBA1C 6.5 (H) 01/03/2015   HGBA1C 6.9 (H) 02/18/2013   HGBA1C 7.7 (H) 11/02/2012   Lab Results  Component Value Date   INSULIN 18.3 08/09/2016   CBC    Component Value Date/Time   WBC 5.1 01/28/2017 0817   RBC 3.50 (L) 01/28/2017 0817   HGB 10.9 (L) 01/28/2017 0817   HGB 10.8 (L) 01/07/2017 0758   HCT 31.9 (L) 01/28/2017 0817   HCT 31.9 (L) 01/07/2017 0758   PLT 154 01/28/2017 0817   PLT 126 (L) 01/07/2017 0758   MCV 91.2 01/28/2017 0817   MCV 90.8 01/07/2017 0758   MCH 31.2 01/28/2017 0817   MCHC 34.2 01/28/2017 0817   RDW 14.5 01/28/2017 0817   RDW 14.8 (H) 01/07/2017 0758   LYMPHSABS 1.1 01/28/2017 0817   LYMPHSABS 1.1 01/07/2017 0758   MONOABS 0.5 01/28/2017 0817   MONOABS 0.3 01/07/2017 0758   EOSABS 0.1 01/28/2017 0817   EOSABS 0.1 01/07/2017 0758   EOSABS 0.1 08/09/2016 1229   BASOSABS 0.0 01/28/2017 0817   BASOSABS 0.0 01/07/2017  0758   Iron/TIBC/Ferritin/ %Sat    Component Value Date/Time   IRON 58 02/18/2013 0827   TIBC 305 02/18/2013 0827   FERRITIN 133 10/31/2014 0816   IRONPCTSAT 19 (L) 02/18/2013 0827   Lipid Panel     Component Value Date/Time   CHOL 182 08/09/2016 1229   TRIG 176 (H) 08/09/2016 1229   HDL 62 08/09/2016 1229   CHOLHDL 2.9 02/18/2013 0827   VLDL 23 02/18/2013 0827   LDLCALC 85 08/09/2016 1229   Hepatic Function Panel     Component Value Date/Time   PROT 6.8 01/28/2017 0817   PROT 6.7 01/07/2017 0758   ALBUMIN 3.7 01/28/2017 0817   ALBUMIN 3.6 01/07/2017 0758   AST 34 01/28/2017 0817   AST 51 (H) 01/07/2017 0758   ALT 47 01/28/2017 0817   ALT 82 (H) 01/07/2017 0758   ALKPHOS 118 01/28/2017 0817   ALKPHOS 115 01/07/2017 0758   BILITOT 0.7 01/28/2017 0817   BILITOT 0.72 01/07/2017 0758      Component Value Date/Time   TSH 2.170 08/09/2016 1229  Ref. Range 08/09/2016 12:29  Vitamin D, 25-Hydroxy Latest Ref Range: 30.0 - 100.0 ng/mL 35.4   ASSESSMENT AND PLAN: Type 2 diabetes mellitus without complication, without long-term current use of insulin (HCC) - Plan: metFORMIN (GLUCOPHAGE) 500 MG tablet  Other depression - with emotional eating - Plan: buPROPion (WELLBUTRIN SR) 200 MG 12 hr tablet, vortioxetine HBr (TRINTELLIX) 20 MG TABS  Vitamin D deficiency  At risk for osteoporosis  Class 1 obesity with serious comorbidity and body mass index (BMI) of 31.0 to 31.9 in adult, unspecified obesity type  PLAN:  Diabetes II Nina has been given extensive diabetes education by myself today including ideal fasting and post-prandial blood glucose readings, individual ideal Hgb A1c goals and hypoglycemia prevention. We discussed the importance of good blood sugar control to decrease the likelihood of diabetic complications such as nephropathy, neuropathy, limb loss, blindness, coronary artery disease, and death. We discussed the importance of intensive lifestyle modification  including diet, exercise and weight loss as the first line treatment for diabetes. Naje agrees to decrease Lantus to 18 units and continue checking blood sugar fasting and 1 to 2 hour post prandial. She agrees to continue metformin 500 mg qd #30 with no refills and will follow up at the agreed upon time.  Vitamin D Deficiency Kelia was informed that low vitamin D levels contributes to fatigue and are associated with obesity, breast, and colon cancer. She agrees to continue to take OTC Vit D @1 ,000 IU every day. We will repeat vitamin D lab at her next visit and will follow up for routine testing of vitamin D, at least 2-3 times per year. She was informed of the risk of over-replacement of vitamin D and agrees to not increase her dose unless she discusses this with [name] first. Puneet agrees to follow up with our clinic in 2 weeks.  At risk for osteopenia and osteoporosis Indie is at risk for osteopenia and osteoporosis due to her vitamin D deficiency. She was encouraged to take her vitamin D and follow her higher calcium diet and increase strengthening exercise to help strengthen her bones and decrease her risk of osteopenia and osteoporosis.  Depression with Emotional Eating Behaviors We discussed behavior modification techniques today to help Deserea deal with her emotional eating and depression. She has agreed to continue Trintellix 20 mg qhs #30 with no refills and continue Wellbutrin 200 mg daily at noon #30 with no refills. Chauntel agrees to follow up as directed.  Obesity Arika is currently in the action stage of change. As such, her goal is to continue with weight loss efforts She has agreed to keep a food journal with 1300 to 1500 calories and 80 grams of protein daily Sariah has been instructed to work up to a goal of 150 minutes of combined cardio and strengthening exercise per week for weight loss and overall health benefits. We discussed the following Behavioral  Modification Strategies today: keep a strict food journal, increasing lean protein intake, decreasing simple carbohydrates  and work on meal planning and easy cooking plans  Wynetta has agreed to follow up with our clinic in 2 weeks. She was informed of the importance of frequent follow up visits to maximize her success with intensive lifestyle modifications for her multiple health conditions.   OBESITY BEHAVIORAL INTERVENTION VISIT  Today's visit was # 12 out of 22.  Starting weight: 225 lbs Starting date: 08/09/16 Today's weight : 213 lbs Today's date: 02/24/2017 Total lbs lost to date: 12 (Patients must lose 7  lbs in the first 6 months to continue with counseling)   ASK: We discussed the diagnosis of obesity with [name] today and Briseis agreed to give [name] permission to discuss obesity behavioral modification therapy today.  ASSESS: Rondia has the diagnosis of obesity and her BMI today is 31.44 Amiel is in the action stage of change   ADVISE: Suheyla was educated on the multiple health risks of obesity as well as the benefit of weight loss to improve her health. She was advised of the need for long term treatment and the importance of lifestyle modifications.  AGREE: Multiple dietary modification options and treatment options were discussed and  Diora agreed to the above obesity treatment plan.  I, Doreene Nest, am acting as transcriptionist for Ilene Qua, MD  I have reviewed the above documentation for accuracy and completeness, and I agree with the above. - Ilene Qua, MD

## 2017-02-26 ENCOUNTER — Ambulatory Visit (INDEPENDENT_AMBULATORY_CARE_PROVIDER_SITE_OTHER): Payer: BC Managed Care – PPO | Admitting: Family Medicine

## 2017-03-07 ENCOUNTER — Other Ambulatory Visit (INDEPENDENT_AMBULATORY_CARE_PROVIDER_SITE_OTHER): Payer: Self-pay | Admitting: Family Medicine

## 2017-03-07 DIAGNOSIS — Z794 Long term (current) use of insulin: Secondary | ICD-10-CM

## 2017-03-07 DIAGNOSIS — E119 Type 2 diabetes mellitus without complications: Secondary | ICD-10-CM

## 2017-03-11 ENCOUNTER — Encounter (INDEPENDENT_AMBULATORY_CARE_PROVIDER_SITE_OTHER): Payer: Self-pay

## 2017-03-11 ENCOUNTER — Ambulatory Visit (INDEPENDENT_AMBULATORY_CARE_PROVIDER_SITE_OTHER): Payer: BC Managed Care – PPO | Admitting: Family Medicine

## 2017-03-11 VITALS — BP 107/66 | HR 63 | Temp 97.9°F | Ht 69.0 in | Wt 211.0 lb

## 2017-03-11 DIAGNOSIS — E669 Obesity, unspecified: Secondary | ICD-10-CM | POA: Diagnosis not present

## 2017-03-11 DIAGNOSIS — Z6831 Body mass index (BMI) 31.0-31.9, adult: Secondary | ICD-10-CM | POA: Diagnosis not present

## 2017-03-11 DIAGNOSIS — Z794 Long term (current) use of insulin: Secondary | ICD-10-CM | POA: Diagnosis not present

## 2017-03-11 DIAGNOSIS — Z9189 Other specified personal risk factors, not elsewhere classified: Secondary | ICD-10-CM | POA: Diagnosis not present

## 2017-03-11 DIAGNOSIS — E559 Vitamin D deficiency, unspecified: Secondary | ICD-10-CM

## 2017-03-11 DIAGNOSIS — E119 Type 2 diabetes mellitus without complications: Secondary | ICD-10-CM | POA: Diagnosis not present

## 2017-03-11 MED ORDER — LIRAGLUTIDE 18 MG/3ML ~~LOC~~ SOPN: 1.2000 mg | PEN_INJECTOR | SUBCUTANEOUS | 0 refills | Status: DC

## 2017-03-11 NOTE — Progress Notes (Signed)
Office: [phone]  /  Fax: [phone]   HPI:   Chief Complaint: OBESITY Darlene [name] is here to discuss her progress with her obesity treatment plan. She is on the  keep a food journal with 1300 to 1500 calories and 80 grams of protein daily and is following her eating plan approximately 50 % of the time. She states she is exercising 0 minutes 0 times per week. Darlene [name] continues to do well with weight loss. She is not journaling, but is mostly controlling her portions and making smarter food choices. She is trying to increase her protein intake, but is unlikely to be meeting her goal. Hunger is mostly controlled. Her weight is 211 lb (95.7 kg) today and has had a weight loss of 2 pounds over a period of 2 weeks since her last visit. She has lost 14 lbs since starting treatment with [name].  Vitamin D deficiency Darlene [name] has a diagnosis of vitamin D deficiency and is due for labs. She is stable on vit D and denies nausea, vomiting or muscle weakness.   Ref. Range 08/09/2016 12:29  Vitamin D, 25-Hydroxy Latest Ref Range: 30.0 - 100.0 ng/mL 35.4   At risk for osteopenia and osteoporosis Darlene [name] is at higher risk of osteopenia and osteoporosis due to vitamin D deficiency.   Diabetes II Darlene [name] has a diagnosis of diabetes type II. She recently decreased her basaglar to 18 units, but is still having fasting BGs in 80 to 90's range. Darlene [name] denies feeling any hypoglycemia. Darlene [name] is doing well with weight loss. She has been working on intensive lifestyle modifications including diet, exercise, and weight loss to help control her blood glucose levels.  ALLERGIES: Allergies  Allergen Reactions  . Nsaids Other (See Comments)    H/o gastric bypass - avoid NSAIDs  . Tape Hives and Rash    Adhesives in bandaids    MEDICATIONS: Current Outpatient Medications on File Prior to Visit  Medication Sig Dispense Refill  . acetaminophen (TYLENOL) 325 MG tablet Take 650 mg by mouth 2 (two) times  daily as needed for moderate pain or headache. Reported on 04/17/2015    . buPROPion (WELLBUTRIN SR) 200 MG 12 hr tablet Take 1 tablet (200 mg total) by mouth daily at 12 noon. 30 tablet 0  . calcium citrate-vitamin D (CITRACAL+D) 315-200 MG-UNIT tablet Take 1 tablet by mouth 3 (three) times daily.    . cholecalciferol (VITAMIN D) 1000 units tablet Take 1,000 Units by mouth daily.    . diphenhydrAMINE (BENADRYL) 25 MG tablet Take 25 mg by mouth daily as needed for allergies or sleep. Reported on 03/06/2015    . glucose blood (ONETOUCH VERIO) test strip 1 each by Other route as needed for other. Reported on 03/06/2015    . hydrochlorothiazide (HYDRODIURIL) 25 MG tablet TAKE 1 TABLET BY MOUTH EVERY DAY 30 tablet 11  . Insulin Glargine (BASAGLAR KWIKPEN) 100 UNIT/ML SOPN Inject 0.25 mLs (25 Units total) into the skin at bedtime. (Patient taking differently: Inject 16 Units into the skin at bedtime. ) 15 pen 0  . Insulin Pen Needle (BD PEN NEEDLE NANO U/F) 32G X 4 MM MISC 1 Package by Does not apply route daily at 12 noon. 100 each 0  . liraglutide (VICTOZA) 18 MG/3ML SOPN Inject 0.2 mLs (1.2 mg total) into the skin every morning. 2 pen 0  . losartan (COZAAR) 100 MG tablet Take 1 tablet by mouth daily. Reported on 03/06/2015    . Melatonin 5 MG TABS Take 5 mg  by mouth at bedtime. Reported on 03/06/2015    . metFORMIN (GLUCOPHAGE) 500 MG tablet Take 1 tablet (500 mg total) by mouth daily. 30 tablet 0  . mometasone (NASONEX) 50 MCG/ACT nasal spray Place 1-2 sprays into the nose daily as needed (for allergies.).     [name] Pediatric Multivitamins-Iron (FLINTSTONES PLUS IRON) chewable tablet Chew 1 tablet by mouth 2 (two) times daily.    . Probiotic Product ([employer] PROBIOTIC PO) Take 1 capsule by mouth daily. Reported on 03/06/2015    . Propylene Glycol (SYSTANE BALANCE OP) Apply 1-2 drops to eye 3 (three) times daily.    [name] senna-docusate (SENNA PLUS) 8.6-50 MG tablet Take 2 tablets by mouth at bedtime.    .  vortioxetine HBr (TRINTELLIX) 20 MG TABS Take 20 mg by mouth at bedtime. 30 tablet 0   No current facility-administered medications on file prior to visit.     PAST MEDICAL HISTORY: Past Medical History:  Diagnosis Date  . Anemia    hx of - during 1st round chemo  . Anxiety   . Arthritis    in fingers, shoulders  . Back pain   . Balance problem   . Breast cancer ([place])   . Breast cancer of upper-outer quadrant of right female breast ([place]) 07/22/2014  . Bunion, right foot   . Clumsiness   . Constipation   . Depression   . Diabetes mellitus without complication ([place])    98+ years  . Eating disorder    binge eating  . Epistaxis 08/26/2014  . Fatty liver   . HCAP (healthcare-associated pneumonia) 12/18/2014  . Headache   . Heart murmur    never had any problems  . History of radiation therapy 04/05/15-05/19/15   right chest wall was treated to 50.4 Gy in 28 fractions, right mastectomy scar was treated to 10 Gy in 5 fractions  . Hyperlipidemia   . Hypertension   . Joint pain   . Multiple food allergies   . Neuromuscular disorder ([place])    diabetic neuropathy  . Obesity   . Obstructive sleep apnea on CPAP    uses cpap nightly  . Ovarian cyst rupture    possible  . Pneumonia   . Pneumonia 11/2014  . Radiation 04/05/15-05/19/15   right chest wall 50.4 Gy, mastectomy scar 10 Gy  . Sleep apnea   . Thyroid nodule 06/2014    PAST SURGICAL HISTORY: Past Surgical History:  Procedure Laterality Date  . APPENDECTOMY    . BIOPSY THYROID Bilateral 06/2014   2 nodules - Benign   . BREAST LUMPECTOMY WITH RADIOACTIVE SEED AND SENTINEL LYMPH NODE BIOPSY Right 12/27/2014   Procedure: BREAST LUMPECTOMY WITH RADIOACTIVE SEED AND SENTINEL LYMPH NODE BIOPSY;  Surgeon: Stark Klein, MD;  Location: [facility];  Service: General;  Laterality: Right;  . BREAST REDUCTION SURGERY Bilateral 01/06/2015   Procedure: BILATERAL BREAST REDUCTION, RIGHT ONCOPLASTIC RECONSTRUCTION),LEFT BREAST  REDUCTION FOR SYMMETRY;  Surgeon: [name], MD;  Location: [facility];  Service: Plastics;  Laterality: Bilateral;  . BREATH TEK H PYLORI N/A 09/15/2012   Procedure: BREATH TEK H PYLORI;  Surgeon: Pedro Earls, MD;  Location: [facility];  Service: General;  Laterality: N/A;  . BUNIONECTOMY Left 12/2013  . CARPAL TUNNEL RELEASE Right   . CARPAL TUNNEL RELEASE Left   . COLONOSCOPY    . DUPUYTREN CONTRACTURE RELEASE    . GASTRIC ROUX-EN-Y N/A 11/02/2012   Procedure: LAPAROSCOPIC ROUX-EN-Y GASTRIC;  Surgeon: Pedro Earls, MD;  Location: [facility];  Service: General;  Laterality: N/A;  . IR GENERIC HISTORICAL  03/18/2016   IR US GUIDE VASC ACCESS LEFT 03/18/2016 Markus Daft, MD [facility]  . IR GENERIC HISTORICAL  03/18/2016   IR FLUORO GUIDE CV LINE LEFT 03/18/2016 Markus Daft, MD [facility]  . LAPAROSCOPIC APPENDECTOMY N/A 07/22/2015   Procedure: APPENDECTOMY LAPAROSCOPIC;  Surgeon: Michael Boston, MD;  Location: [facility];  Service: General;  Laterality: N/A;  . MASTECTOMY Right 02/15/2015   modified  . MODIFIED MASTECTOMY Right 02/15/2015   Procedure: RIGHT MODIFIED RADICAL MASTECTOMY;  Surgeon: Stark Klein, MD;  Location: [facility];  Service: General;  Laterality: Right;  . PORT-A-CATH REMOVAL Left 10/10/2015   Procedure: PORT REMOVAL;  Surgeon: Stark Klein, MD;  Location: [facility];  Service: General;  Laterality: Left;  . PORTACATH PLACEMENT Left 08/02/2014   Procedure: INSERTION PORT-A-CATH;  Surgeon: Stark Klein, MD;  Location: [facility];  Service: General;  Laterality: Left;  . PORTACATH PLACEMENT N/A 11/05/2016   Procedure: INSERTION PORT-A-CATH;  Surgeon: Stark Klein, MD;  Location: [facility];  Service: General;  Laterality: N/A;  . ROTATOR CUFF REPAIR Right 2005  . SCAR REVISION Right 12/08/2015   Procedure: COMPLEX REPAIR RIGHT CHEST 10-15CM;  Surgeon: [name], MD;  Location: [facility];  Service: Plastics;  Laterality: Right;  . TOE SURGERY  1970s     bone spur  . TRIGGER FINGER RELEASE     x3    SOCIAL HISTORY: Social History   Tobacco Use  . Smoking status: Never Smoker  . Smokeless tobacco: Never Used  Substance Use Topics  . Alcohol use: Yes    Alcohol/week: 0.0 oz    Comment: social  . Drug use: No    FAMILY HISTORY: Family History  Problem Relation Age of Onset  . CVA Mother   . Heart disease Mother   . Sudden death Mother   . Diabetes Father   . Heart failure Father   . Hypertension Father   . Kidney disease Father   . Obesity Father   . Hypertension Sister   . Diabetes Brother   . Breast cancer Paternal Grandmother   . Diabetes Paternal Uncle     ROS: Review of Systems  Constitutional: Positive for weight loss.  Gastrointestinal: Negative for nausea and vomiting.  Musculoskeletal:       Negative for muscle weakness  Endo/Heme/Allergies:       Negative for hypoglycemia    PHYSICAL EXAM: Blood pressure 107/66, pulse 63, temperature 97.9 F (36.6 C), temperature source Oral, height 5\' 9"  (1.753 m), weight 211 lb (95.7 kg), last menstrual period 10/22/2007, SpO2 97 %. Body mass index is 31.16 kg/m. Physical Exam  Constitutional: She is oriented to person, place, and time. She appears well-developed and well-nourished.  Cardiovascular: Normal rate.  Pulmonary/Chest: Effort normal.  Musculoskeletal: Normal range of motion.  Neurological: She is oriented to person, place, and time.  Skin: Skin is warm and dry.  Psychiatric: She has a normal mood and affect. Her behavior is normal.  Vitals reviewed.   RECENT LABS AND TESTS: BMET    Component Value Date/Time   NA 140 02/25/2017 0845   NA 140 01/07/2017 0758   K 4.0 02/25/2017 0845   K 4.1 01/07/2017 0758   CL 110 (H) 02/25/2017 0845   CO2 21 (L) 02/25/2017 0845   CO2 24 01/07/2017 0758   GLUCOSE 160 (H) 02/25/2017 0845   GLUCOSE 134 01/07/2017 0758  BUN 41 (H) 02/25/2017 0845   BUN 33.6 (H) 01/07/2017 0758   CREATININE 1.65 (H)  02/25/2017 0845   CREATININE 1.5 (H) 01/07/2017 0758   CALCIUM 9.2 02/25/2017 0845   CALCIUM 9.3 01/07/2017 0758   GFRNONAA 32 (L) 02/25/2017 0845   GFRNONAA 55 (L) 02/18/2013 0827   GFRAA 37 (L) 02/25/2017 0845   GFRAA 63 02/18/2013 0827   Lab Results  Component Value Date   HGBA1C 8.2 (H) 11/05/2016   HGBA1C 8.1 (H) 08/09/2016   HGBA1C 6.5 (H) 01/03/2015   HGBA1C 6.9 (H) 02/18/2013   HGBA1C 7.7 (H) 11/02/2012   Lab Results  Component Value Date   INSULIN 18.3 08/09/2016   CBC    Component Value Date/Time   WBC 6.1 02/25/2017 0845   RBC 3.54 (L) 02/25/2017 0845   HGB 11.1 (L) 02/25/2017 0845   HGB 10.8 (L) 01/07/2017 0758   HCT 32.7 (L) 02/25/2017 0845   HCT 31.9 (L) 01/07/2017 0758   PLT 150 02/25/2017 0845   PLT 126 (L) 01/07/2017 0758   MCV 92.4 02/25/2017 0845   MCV 90.8 01/07/2017 0758   MCH 31.4 02/25/2017 0845   MCHC 33.9 02/25/2017 0845   RDW 13.9 02/25/2017 0845   RDW 14.8 (H) 01/07/2017 0758   LYMPHSABS 1.0 02/25/2017 0845   LYMPHSABS 1.1 01/07/2017 0758   MONOABS 0.5 02/25/2017 0845   MONOABS 0.3 01/07/2017 0758   EOSABS 0.2 02/25/2017 0845   EOSABS 0.1 01/07/2017 0758   EOSABS 0.1 08/09/2016 1229   BASOSABS 0.0 02/25/2017 0845   BASOSABS 0.0 01/07/2017 0758   Iron/TIBC/Ferritin/ %Sat    Component Value Date/Time   IRON 58 02/18/2013 0827   TIBC 305 02/18/2013 0827   FERRITIN 133 10/31/2014 0816   IRONPCTSAT 19 (L) 02/18/2013 0827   Lipid Panel     Component Value Date/Time   CHOL 182 08/09/2016 1229   TRIG 176 (H) 08/09/2016 1229   HDL 62 08/09/2016 1229   CHOLHDL 2.9 02/18/2013 0827   VLDL 23 02/18/2013 0827   LDLCALC 85 08/09/2016 1229   Hepatic Function Panel     Component Value Date/Time   PROT 6.9 02/25/2017 0845   PROT 6.7 01/07/2017 0758   ALBUMIN 3.9 02/25/2017 0845   ALBUMIN 3.6 01/07/2017 0758   AST 40 (H) 02/25/2017 0845   AST 51 (H) 01/07/2017 0758   ALT 49 02/25/2017 0845   ALT 82 (H) 01/07/2017 0758   ALKPHOS 104  02/25/2017 0845   ALKPHOS 115 01/07/2017 0758   BILITOT 0.6 02/25/2017 0845   BILITOT 0.72 01/07/2017 0758      Component Value Date/Time   TSH 2.170 08/09/2016 1229     Ref. Range 08/09/2016 12:29  Vitamin D, 25-Hydroxy Latest Ref Range: 30.0 - 100.0 ng/mL 35.4   ASSESSMENT AND PLAN: Type 2 diabetes mellitus without complication, with long-term current use of insulin (HCC) - Plan: Comprehensive metabolic panel, Hemoglobin A1c, Insulin, random, C-peptide, liraglutide (VICTOZA) 18 MG/3ML SOPN  Vitamin D deficiency - Plan: VITAMIN D 25 Hydroxy (Vit-D Deficiency, Fractures)  At risk for osteoporosis  Class 1 obesity with serious comorbidity and body mass index (BMI) of 31.0 to 31.9 in adult, unspecified obesity type  PLAN:  Vitamin D Deficiency Trixie was informed that low vitamin D levels contributes to fatigue and are associated with obesity, breast, and colon cancer. She agrees to continue to take OTC vitamin D 1,000 IU daily. We will check labs and will follow up for routine testing of vitamin D,  at least 2-3 times per year. She was informed of the risk of over-replacement of vitamin D and agrees to not increase her dose unless she discusses this with [name] first. [name] agrees to follow up as directed.  At risk for osteopenia and osteoporosis Tayana is at risk for osteopenia and osteoporosis due to her vitamin D deficiency. She was encouraged to take her vitamin D and follow her higher calcium diet and increase strengthening exercise to help strengthen her bones and decrease her risk of osteopenia and osteoporosis.  Diabetes II [place] has been given extensive diabetes education by myself today including ideal fasting and post-prandial blood glucose readings, individual ideal Hgb A1c goals and hypoglycemia prevention. We discussed the importance of good blood sugar control to decrease the likelihood of diabetic complications such as nephropathy, neuropathy, limb loss, blindness,  coronary artery disease, and death. We discussed the importance of intensive lifestyle modification including diet, exercise and weight loss as the first line treatment for diabetes. Cherylee agrees to decrease basaglar to 16 units and continue victoza 1.2 mg #2 pens with no refills. We will check labs and Rhealyn agreed to follow up at the agreed upon time.  Obesity Kielyn is currently in the action stage of change. As such, her goal is to continue with weight loss efforts She has agreed to keep a food journal with 1300 to 1500 calories and 80 grams of protein daily Brenda has been instructed to work up to a goal of 150 minutes of combined cardio and strengthening exercise per week for weight loss and overall health benefits. We discussed the following Behavioral Modification Strategies today: increasing lean protein intake and keep a strict food journal  Isobel has agreed to follow up with our clinic in 2 weeks. She was informed of the importance of frequent follow up visits to maximize her success with intensive lifestyle modifications for her multiple health conditions.   OBESITY BEHAVIORAL INTERVENTION VISIT  Today's visit was # 13 out of 22.  Starting weight: 225 lbs Starting date: 08/09/16 Today's weight : 211 lbs Today's date: 03/11/2017 Total lbs lost to date: 14 (Patients must lose 7 lbs in the first 6 months to continue with counseling)   ASK: We discussed the diagnosis of obesity with [name] today and Yvone agreed to give [name] permission to discuss obesity behavioral modification therapy today.  ASSESS: Harolyn has the diagnosis of obesity and her BMI today is 31.15 Karleen is in the action stage of change   ADVISE: Giuliana was educated on the multiple health risks of obesity as well as the benefit of weight loss to improve her health. She was advised of the need for long term treatment and the importance of lifestyle  modifications.  AGREE: Multiple dietary modification options and treatment options were discussed and  Adalia agreed to the above obesity treatment plan.  I, Doreene Nest, am acting as transcriptionist for Dennard Nip, MD  I have reviewed the above documentation for accuracy and completeness, and I agree with the above. -Dennard Nip, MD

## 2017-03-12 LAB — COMPREHENSIVE METABOLIC PANEL
ALT: 64 IU/L — ABNORMAL HIGH (ref 0–32)
AST: 45 IU/L — ABNORMAL HIGH (ref 0–40)
Albumin/Globulin Ratio: 1.9 (ref 1.2–2.2)
Albumin: 4.5 g/dL (ref 3.6–4.8)
Alkaline Phosphatase: 116 IU/L (ref 39–117)
BUN/Creatinine Ratio: 24 (ref 12–28)
BUN: 40 mg/dL — ABNORMAL HIGH (ref 8–27)
Bilirubin Total: 0.5 mg/dL (ref 0.0–1.2)
CO2: 21 mmol/L (ref 20–29)
Calcium: 9.8 mg/dL (ref 8.7–10.3)
Chloride: 106 mmol/L (ref 96–106)
Creatinine, Ser: 1.68 mg/dL — ABNORMAL HIGH (ref 0.57–1.00)
GFR calc Af Amer: 37 mL/min/{1.73_m2} — ABNORMAL LOW (ref >59)
GFR calc non Af Amer: 32 mL/min/{1.73_m2} — ABNORMAL LOW (ref >59)
Globulin, Total: 2.4 g/dL (ref 1.5–4.5)
Glucose: 125 mg/dL — ABNORMAL HIGH (ref 65–99)
Potassium: 4.4 mmol/L (ref 3.5–5.2)
Sodium: 143 mmol/L (ref 134–144)
Total Protein: 6.9 g/dL (ref 6.0–8.5)

## 2017-03-12 LAB — INSULIN, RANDOM: INSULIN: 15.3 u[IU]/mL (ref 2.6–24.9)

## 2017-03-12 LAB — HEMOGLOBIN A1C
Est. average glucose Bld gHb Est-mCnc: 128 mg/dL
Hgb A1c MFr Bld: 6.1 % — ABNORMAL HIGH (ref 4.8–5.6)

## 2017-03-12 LAB — C-PEPTIDE: C-Peptide: 4.4 ng/mL (ref 1.1–4.4)

## 2017-03-12 LAB — VITAMIN D 25 HYDROXY (VIT D DEFICIENCY, FRACTURES): Vit D, 25-Hydroxy: 60.4 ng/mL (ref 30.0–100.0)

## 2017-03-23 ENCOUNTER — Other Ambulatory Visit (INDEPENDENT_AMBULATORY_CARE_PROVIDER_SITE_OTHER): Payer: Self-pay | Admitting: Family Medicine

## 2017-03-23 DIAGNOSIS — E119 Type 2 diabetes mellitus without complications: Secondary | ICD-10-CM

## 2017-03-23 DIAGNOSIS — Z794 Long term (current) use of insulin: Secondary | ICD-10-CM

## 2017-03-24 ENCOUNTER — Other Ambulatory Visit: Payer: Self-pay

## 2017-03-24 DIAGNOSIS — Z171 Estrogen receptor negative status [ER-]: Secondary | ICD-10-CM

## 2017-03-24 DIAGNOSIS — C50411 Malignant neoplasm of upper-outer quadrant of right female breast: Secondary | ICD-10-CM

## 2017-03-25 ENCOUNTER — Inpatient Hospital Stay: Payer: BC Managed Care – PPO

## 2017-03-25 ENCOUNTER — Inpatient Hospital Stay: Payer: BC Managed Care – PPO | Attending: Oncology

## 2017-03-25 VITALS — BP 128/63 | HR 65 | Temp 98.0°F | Resp 18 | Wt 214.5 lb

## 2017-03-25 DIAGNOSIS — C50411 Malignant neoplasm of upper-outer quadrant of right female breast: Secondary | ICD-10-CM | POA: Diagnosis present

## 2017-03-25 DIAGNOSIS — Z171 Estrogen receptor negative status [ER-]: Secondary | ICD-10-CM

## 2017-03-25 DIAGNOSIS — Z5112 Encounter for antineoplastic immunotherapy: Secondary | ICD-10-CM | POA: Insufficient documentation

## 2017-03-25 DIAGNOSIS — Z95828 Presence of other vascular implants and grafts: Secondary | ICD-10-CM

## 2017-03-25 LAB — CMP (CANCER CENTER ONLY)
ALT: 46 U/L (ref 0–55)
AST: 32 U/L (ref 5–34)
Albumin: 3.7 g/dL (ref 3.5–5.0)
Alkaline Phosphatase: 100 U/L (ref 40–150)
Anion gap: 9 (ref 3–11)
BUN: 44 mg/dL — ABNORMAL HIGH (ref 7–26)
CO2: 21 mmol/L — ABNORMAL LOW (ref 22–29)
Calcium: 9.6 mg/dL (ref 8.4–10.4)
Chloride: 108 mmol/L (ref 98–109)
Creatinine: 1.68 mg/dL — ABNORMAL HIGH (ref 0.60–1.10)
GFR, Est AFR Am: 36 mL/min — ABNORMAL LOW (ref >60)
GFR, Estimated: 31 mL/min — ABNORMAL LOW (ref >60)
Glucose, Bld: 145 mg/dL — ABNORMAL HIGH (ref 70–140)
Potassium: 4 mmol/L (ref 3.5–5.1)
Sodium: 138 mmol/L (ref 136–145)
Total Bilirubin: 0.7 mg/dL (ref 0.2–1.2)
Total Protein: 6.8 g/dL (ref 6.4–8.3)

## 2017-03-25 LAB — CBC WITH DIFFERENTIAL (CANCER CENTER ONLY)
Basophils Absolute: 0 10*3/uL (ref 0.0–0.1)
Basophils Relative: 0 %
Eosinophils Absolute: 0.2 10*3/uL (ref 0.0–0.5)
Eosinophils Relative: 3 %
HCT: 33.4 % — ABNORMAL LOW (ref 34.8–46.6)
Hemoglobin: 11.2 g/dL — ABNORMAL LOW (ref 11.6–15.9)
Lymphocytes Relative: 24 %
Lymphs Abs: 1.2 10*3/uL (ref 0.9–3.3)
MCH: 31 pg (ref 25.1–34.0)
MCHC: 33.5 g/dL (ref 31.5–36.0)
MCV: 92.5 fL (ref 79.5–101.0)
Monocytes Absolute: 0.4 10*3/uL (ref 0.1–0.9)
Monocytes Relative: 8 %
Neutro Abs: 3.2 10*3/uL (ref 1.5–6.5)
Neutrophils Relative %: 65 %
Platelet Count: 148 10*3/uL (ref 145–400)
RBC: 3.61 MIL/uL — ABNORMAL LOW (ref 3.70–5.45)
RDW: 13.6 % (ref 11.2–14.5)
WBC Count: 5 10*3/uL (ref 3.9–10.3)

## 2017-03-25 MED ORDER — ACETAMINOPHEN 325 MG PO TABS
650.0000 mg | ORAL_TABLET | Freq: Once | ORAL | Status: AC
  Administered 2017-03-25: 650 mg via ORAL

## 2017-03-25 MED ORDER — ACETAMINOPHEN 325 MG PO TABS
ORAL_TABLET | ORAL | Status: AC
Start: 2017-03-25 — End: 2017-03-25
  Filled 2017-03-25: qty 2

## 2017-03-25 MED ORDER — SODIUM CHLORIDE 0.9 % IV SOLN
Freq: Once | INTRAVENOUS | Status: AC
  Administered 2017-03-25: 09:00:00 via INTRAVENOUS

## 2017-03-25 MED ORDER — SODIUM CHLORIDE 0.9 % IJ SOLN
10.0000 mL | INTRAMUSCULAR | Status: DC | PRN
  Administered 2017-03-25: 10 mL via INTRAVENOUS
  Filled 2017-03-25: qty 10

## 2017-03-25 MED ORDER — SODIUM CHLORIDE 0.9 % IV SOLN
600.0000 mg | Freq: Once | INTRAVENOUS | Status: AC
  Administered 2017-03-25: 600 mg via INTRAVENOUS
  Filled 2017-03-25: qty 28.57

## 2017-03-25 MED ORDER — HEPARIN SOD (PORK) LOCK FLUSH 100 UNIT/ML IV SOLN
500.0000 [IU] | Freq: Once | INTRAVENOUS | Status: AC | PRN
  Administered 2017-03-25: 500 [IU]
  Filled 2017-03-25: qty 5

## 2017-03-25 MED ORDER — DIPHENHYDRAMINE HCL 12.5 MG/5ML PO ELIX
ORAL_SOLUTION | ORAL | Status: AC
Start: 2017-03-25 — End: 2017-03-25
  Filled 2017-03-25: qty 5

## 2017-03-25 MED ORDER — DIPHENHYDRAMINE HCL 12.5 MG/5ML PO ELIX
12.5000 mg | ORAL_SOLUTION | Freq: Once | ORAL | Status: AC
  Administered 2017-03-25: 12.5 mg via ORAL

## 2017-03-25 MED ORDER — SODIUM CHLORIDE 0.9% FLUSH
10.0000 mL | INTRAVENOUS | Status: DC | PRN
  Administered 2017-03-25: 10 mL
  Filled 2017-03-25: qty 10

## 2017-03-25 NOTE — Patient Instructions (Signed)
[facility] Discharge Instructions for Patients Receiving Chemotherapy  Today you received the following chemotherapy agents trastuzumab (Herceptin)  To help prevent nausea and vomiting after your treatment, we encourage you to take your nausea medication as directed  If you develop nausea and vomiting that is not controlled by your nausea medication, call the clinic.   BELOW ARE SYMPTOMS THAT SHOULD BE REPORTED IMMEDIATELY:  *FEVER GREATER THAN 100.5 F  *CHILLS WITH OR WITHOUT FEVER  NAUSEA AND VOMITING THAT IS NOT CONTROLLED WITH YOUR NAUSEA MEDICATION  *UNUSUAL SHORTNESS OF BREATH  *UNUSUAL BRUISING OR BLEEDING  TENDERNESS IN MOUTH AND THROAT WITH OR WITHOUT PRESENCE OF ULCERS  *URINARY PROBLEMS  *BOWEL PROBLEMS  UNUSUAL RASH Items with * indicate a potential emergency and should be followed up as soon as possible.  Feel free to call the clinic should you have any questions or concerns. The clinic phone number is [phone].  Please show the CHEMO ALERT CARD at check-in to the Emergency Department and triage nurse.   

## 2017-03-26 ENCOUNTER — Ambulatory Visit (INDEPENDENT_AMBULATORY_CARE_PROVIDER_SITE_OTHER): Payer: BC Managed Care – PPO | Admitting: Family Medicine

## 2017-03-26 ENCOUNTER — Other Ambulatory Visit (INDEPENDENT_AMBULATORY_CARE_PROVIDER_SITE_OTHER): Payer: Self-pay | Admitting: Family Medicine

## 2017-03-26 VITALS — BP 102/66 | HR 58 | Temp 97.7°F | Ht 69.0 in | Wt 211.0 lb

## 2017-03-26 DIAGNOSIS — F3289 Other specified depressive episodes: Secondary | ICD-10-CM | POA: Diagnosis not present

## 2017-03-26 DIAGNOSIS — Z794 Long term (current) use of insulin: Secondary | ICD-10-CM | POA: Diagnosis not present

## 2017-03-26 DIAGNOSIS — E669 Obesity, unspecified: Secondary | ICD-10-CM

## 2017-03-26 DIAGNOSIS — E119 Type 2 diabetes mellitus without complications: Secondary | ICD-10-CM

## 2017-03-26 DIAGNOSIS — Z6831 Body mass index (BMI) 31.0-31.9, adult: Secondary | ICD-10-CM

## 2017-03-26 MED ORDER — INSULIN PEN NEEDLE 32G X 4 MM MISC: 1.0000 | Freq: Every day | 0 refills | Status: DC

## 2017-03-26 MED ORDER — BUPROPION HCL ER (SR) 200 MG PO TB12: 200.0000 mg | ORAL_TABLET | Freq: Every day | ORAL | 0 refills | Status: DC

## 2017-03-26 MED ORDER — METFORMIN HCL 500 MG PO TABS: 500.0000 mg | ORAL_TABLET | Freq: Every day | ORAL | 0 refills | Status: DC

## 2017-03-26 MED ORDER — TOPIRAMATE 25 MG PO TABS: 25.0000 mg | ORAL_TABLET | Freq: Every day | ORAL | 0 refills | Status: DC

## 2017-03-26 MED ORDER — BASAGLAR KWIKPEN 100 UNIT/ML ~~LOC~~ SOPN: 16.0000 [IU] | PEN_INJECTOR | Freq: Every day | SUBCUTANEOUS | 0 refills | Status: DC

## 2017-03-26 NOTE — Progress Notes (Signed)
Office: [phone]  /  Fax: [phone]   HPI:   Chief Complaint: OBESITY Darlene [name] is here to discuss her progress with her obesity treatment plan. She is on the keep a food journal with 1300-1500 calories and 80 grams of protein daily and is following her eating plan approximately 70 % of the time. She states she is exercising 0 minutes 0 times per week. Darlene [name] has done well maintaining weight loss over last few weeks. Journaling on and off but still struggling with emotional and stress eating.  Her weight is 211 lb (95.7 kg) today and has not lost weight since her last visit. She has lost 14 lbs since starting treatment with [name].  Diabetes II Darlene [name] has a diagnosis of diabetes type II. Darlene [name] did not bring in BGs log, she is not checking BGs often and she requests a refill of Basaglar and metformin. She denies any hypoglycemic episodes. Last A1c was 6.1 on 03/11/17. She has been working on intensive lifestyle modifications including diet, exercise, and weight loss to help control her blood glucose levels.  Depression with emotional eating behaviors Darlene [name] is on Wellbutrin and Trintellix but still notes increase emotional and stress eating. Darlene [name] struggles with emotional eating and using food for comfort to the extent that it is negatively impacting her health. She often snacks when she is not hungry. Darlene [name] sometimes feels she is out of control and then feels guilty that she made poor food choices. She has been working on behavior modification techniques to help reduce her emotional eating and has been somewhat successful. She shows no sign of suicidal or homicidal ideations.  Depression screen [facility] 2/9 09/12/2016 08/09/2016 01/01/2016 11/15/2015 10/12/2015  Decreased Interest 0 1 0 0 0  Down, Depressed, Hopeless 0 1 0 0 0  PHQ - 2 Score 0 2 0 0 0  Altered sleeping - 0 - - -  Tired, decreased energy - 1 - - -  Change in appetite - 2 - - -  Feeling bad or failure about yourself   - 2 - - -  Trouble concentrating - 1 - - -  Moving slowly or fidgety/restless - 1 - - -  Suicidal thoughts - 0 - - -  PHQ-9 Score - 9 - - -  Some recent data might be hidden   ALLERGIES: Allergies  Allergen Reactions  . Nsaids Other (See Comments)    H/o gastric bypass - avoid NSAIDs  . Tape Hives and Rash    Adhesives in bandaids    MEDICATIONS: Current Outpatient Medications on File Prior to Visit  Medication Sig Dispense Refill  . acetaminophen (TYLENOL) 325 MG tablet Take 650 mg by mouth 2 (two) times daily as needed for moderate pain or headache. Reported on 04/17/2015    . calcium citrate-vitamin D (CITRACAL+D) 315-200 MG-UNIT tablet Take 1 tablet by mouth 3 (three) times daily.    . cholecalciferol (VITAMIN D) 1000 units tablet Take 1,000 Units by mouth daily.    . diphenhydrAMINE (BENADRYL) 25 MG tablet Take 25 mg by mouth daily as needed for allergies or sleep. Reported on 03/06/2015    . glucose blood (ONETOUCH VERIO) test strip 1 each by Other route as needed for other. Reported on 03/06/2015    . hydrochlorothiazide (HYDRODIURIL) 25 MG tablet TAKE 1 TABLET BY MOUTH EVERY DAY 30 tablet 11  . liraglutide (VICTOZA) 18 MG/3ML SOPN Inject 0.2 mLs (1.2 mg total) into the skin every morning. 2 pen 0  . losartan (COZAAR) 100  MG tablet Take 1 tablet by mouth daily. Reported on 03/06/2015    . Melatonin 5 MG TABS Take 5 mg by mouth at bedtime. Reported on 03/06/2015    . mometasone (NASONEX) 50 MCG/ACT nasal spray Place 1-2 sprays into the nose daily as needed (for allergies.).     [name] Pediatric Multivitamins-Iron (FLINTSTONES PLUS IRON) chewable tablet Chew 1 tablet by mouth 2 (two) times daily.    . Probiotic Product ([employer] PROBIOTIC PO) Take 1 capsule by mouth daily. Reported on 03/06/2015    . Propylene Glycol (SYSTANE BALANCE OP) Apply 1-2 drops to eye 3 (three) times daily.    [name] senna-docusate (SENNA PLUS) 8.6-50 MG tablet Take 2 tablets by mouth at bedtime.    . vortioxetine HBr  (TRINTELLIX) 20 MG TABS Take 20 mg by mouth at bedtime. 30 tablet 0   No current facility-administered medications on file prior to visit.     PAST MEDICAL HISTORY: Past Medical History:  Diagnosis Date  . Anemia    hx of - during 1st round chemo  . Anxiety   . Arthritis    in fingers, shoulders  . Back pain   . Balance problem   . Breast cancer ([place])   . Breast cancer of upper-outer quadrant of right female breast ([place]) 07/22/2014  . Bunion, right foot   . Clumsiness   . Constipation   . Depression   . Diabetes mellitus without complication ([place])    95+ years  . Eating disorder    binge eating  . Epistaxis 08/26/2014  . Fatty liver   . HCAP (healthcare-associated pneumonia) 12/18/2014  . Headache   . Heart murmur    never had any problems  . History of radiation therapy 04/05/15-05/19/15   right chest wall was treated to 50.4 Gy in 28 fractions, right mastectomy scar was treated to 10 Gy in 5 fractions  . Hyperlipidemia   . Hypertension   . Joint pain   . Multiple food allergies   . Neuromuscular disorder ([place])    diabetic neuropathy  . Obesity   . Obstructive sleep apnea on CPAP    uses cpap nightly  . Ovarian cyst rupture    possible  . Pneumonia   . Pneumonia 11/2014  . Radiation 04/05/15-05/19/15   right chest wall 50.4 Gy, mastectomy scar 10 Gy  . Sleep apnea   . Thyroid nodule 06/2014    PAST SURGICAL HISTORY: Past Surgical History:  Procedure Laterality Date  . APPENDECTOMY    . BIOPSY THYROID Bilateral 06/2014   2 nodules - Benign   . BREAST LUMPECTOMY WITH RADIOACTIVE SEED AND SENTINEL LYMPH NODE BIOPSY Right 12/27/2014   Procedure: BREAST LUMPECTOMY WITH RADIOACTIVE SEED AND SENTINEL LYMPH NODE BIOPSY;  Surgeon: Stark Klein, MD;  Location: [place];  Service: General;  Laterality: Right;  . BREAST REDUCTION SURGERY Bilateral 01/06/2015   Procedure: BILATERAL BREAST REDUCTION, RIGHT ONCOPLASTIC RECONSTRUCTION),LEFT BREAST REDUCTION FOR  SYMMETRY;  Surgeon: [name], MD;  Location: [place];  Service: Plastics;  Laterality: Bilateral;  . BREATH TEK H PYLORI N/A 09/15/2012   Procedure: BREATH TEK H PYLORI;  Surgeon: Pedro Earls, MD;  Location: [facility];  Service: General;  Laterality: N/A;  . BUNIONECTOMY Left 12/2013  . CARPAL TUNNEL RELEASE Right   . CARPAL TUNNEL RELEASE Left   . COLONOSCOPY    . DUPUYTREN CONTRACTURE RELEASE    . GASTRIC ROUX-EN-Y N/A 11/02/2012   Procedure: LAPAROSCOPIC ROUX-EN-Y GASTRIC;  Surgeon: Rodman Key  Verdie Drown, MD;  Location: [facility];  Service: General;  Laterality: N/A;  . IR GENERIC HISTORICAL  03/18/2016   IR US GUIDE VASC ACCESS LEFT 03/18/2016 Markus Daft, MD [facility]  . IR GENERIC HISTORICAL  03/18/2016   IR FLUORO GUIDE CV LINE LEFT 03/18/2016 Markus Daft, MD [facility]  . LAPAROSCOPIC APPENDECTOMY N/A 07/22/2015   Procedure: APPENDECTOMY LAPAROSCOPIC;  Surgeon: Michael Boston, MD;  Location: [facility];  Service: General;  Laterality: N/A;  . MASTECTOMY Right 02/15/2015   modified  . MODIFIED MASTECTOMY Right 02/15/2015   Procedure: RIGHT MODIFIED RADICAL MASTECTOMY;  Surgeon: Stark Klein, MD;  [facility]: [facility];  Service: General;  Laterality: Right;  . PORT-A-CATH REMOVAL Left 10/10/2015   Procedure: PORT REMOVAL;  Surgeon: Stark Klein, MD;  Location: [place];  Service: General;  Laterality: Left;  . PORTACATH PLACEMENT Left 08/02/2014   Procedure: INSERTION PORT-A-CATH;  Surgeon: Stark Klein, MD;  Location: [place];  Service: General;  Laterality: Left;  . PORTACATH PLACEMENT N/A 11/05/2016   Procedure: INSERTION PORT-A-CATH;  Surgeon: Stark Klein, MD;  Location: [place];  Service: General;  Laterality: N/A;  . ROTATOR CUFF REPAIR Right 2005  . SCAR REVISION Right 12/08/2015   Procedure: COMPLEX REPAIR RIGHT CHEST 10-15CM;  Surgeon: [name], MD;  Location: [place];  Service: Plastics;  Laterality: Right;  . TOE SURGERY  1970s   bone spur    . TRIGGER FINGER RELEASE     x3    SOCIAL HISTORY: Social History   Tobacco Use  . Smoking status: Never Smoker  . Smokeless tobacco: Never Used  Substance Use Topics  . Alcohol use: Yes    Alcohol/week: 0.0 oz    Comment: social  . Drug use: No    FAMILY HISTORY: Family History  Problem Relation Age of Onset  . CVA Mother   . Heart disease Mother   . Sudden death Mother   . Diabetes Father   . Heart failure Father   . Hypertension Father   . Kidney disease Father   . Obesity Father   . Hypertension Sister   . Diabetes Brother   . Breast cancer Paternal Grandmother   . Diabetes Paternal Uncle     ROS: Review of Systems  Constitutional: Negative for weight loss.  Endo/Heme/Allergies:       Negative hypoglycemia  Psychiatric/Behavioral: Positive for depression. Negative for suicidal ideas.    PHYSICAL EXAM: Blood pressure 102/66, pulse (!) 58, temperature 97.7 F (36.5 C), temperature source Oral, height 5\' 9"  (1.753 m), weight 211 lb (95.7 kg), last menstrual period 10/22/2007, SpO2 100 %. Body mass index is 31.16 kg/m. Physical Exam  Constitutional: She is oriented to person, place, and time. She appears well-developed and well-nourished.  Cardiovascular: Normal rate.  Pulmonary/Chest: Effort normal.  Musculoskeletal: Normal range of motion.  Neurological: She is oriented to person, place, and time.  Skin: Skin is warm and dry.  Psychiatric: She has a normal mood and affect. Her behavior is normal.  Vitals reviewed.   RECENT LABS AND TESTS: BMET    Component Value Date/Time   NA 138 03/25/2017 0811   NA 143 03/11/2017 0841   NA 140 01/07/2017 0758   K 4.0 03/25/2017 0811   K 4.1 01/07/2017 0758   CL 108 03/25/2017 0811   CO2 21 (L) 03/25/2017 0811   CO2 24 01/07/2017 0758   GLUCOSE 145 (H) 03/25/2017 0811   GLUCOSE 134 01/07/2017 0758  BUN 44 (H) 03/25/2017 0811   BUN 40 (H) 03/11/2017 0841   BUN 33.6 (H) 01/07/2017 0758   CREATININE 1.68  (H) 03/25/2017 0811   CREATININE 1.5 (H) 01/07/2017 0758   CALCIUM 9.6 03/25/2017 0811   CALCIUM 9.3 01/07/2017 0758   GFRNONAA 31 (L) 03/25/2017 0811   GFRNONAA 55 (L) 02/18/2013 0827   GFRAA 36 (L) 03/25/2017 0811   GFRAA 63 02/18/2013 0827   Lab Results  Component Value Date   HGBA1C 6.1 (H) 03/11/2017   HGBA1C 8.2 (H) 11/05/2016   HGBA1C 8.1 (H) 08/09/2016   HGBA1C 6.5 (H) 01/03/2015   HGBA1C 6.9 (H) 02/18/2013   Lab Results  Component Value Date   INSULIN 15.3 03/11/2017   INSULIN 18.3 08/09/2016   CBC    Component Value Date/Time   WBC 5.0 03/25/2017 0811   WBC 6.1 02/25/2017 0845   RBC 3.61 (L) 03/25/2017 0811   HGB 11.1 (L) 02/25/2017 0845   HGB 10.8 (L) 01/07/2017 0758   HCT 33.4 (L) 03/25/2017 0811   HCT 31.9 (L) 01/07/2017 0758   PLT 148 03/25/2017 0811   PLT 126 (L) 01/07/2017 0758   MCV 92.5 03/25/2017 0811   MCV 90.8 01/07/2017 0758   MCH 31.0 03/25/2017 0811   MCHC 33.5 03/25/2017 0811   RDW 13.6 03/25/2017 0811   RDW 14.8 (H) 01/07/2017 0758   LYMPHSABS 1.2 03/25/2017 0811   LYMPHSABS 1.1 01/07/2017 0758   MONOABS 0.4 03/25/2017 0811   MONOABS 0.3 01/07/2017 0758   EOSABS 0.2 03/25/2017 0811   EOSABS 0.1 01/07/2017 0758   EOSABS 0.1 08/09/2016 1229   BASOSABS 0.0 03/25/2017 0811   BASOSABS 0.0 01/07/2017 0758   Iron/TIBC/Ferritin/ %Sat    Component Value Date/Time   IRON 58 02/18/2013 0827   TIBC 305 02/18/2013 0827   FERRITIN 133 10/31/2014 0816   IRONPCTSAT 19 (L) 02/18/2013 0827   Lipid Panel     Component Value Date/Time   CHOL 164 02/07/2017   CHOL 182 08/09/2016 1229   TRIG 114 02/07/2017   HDL 59 02/07/2017   HDL 62 08/09/2016 1229   CHOLHDL 2.9 02/18/2013 0827   VLDL 23 02/18/2013 0827   LDLCALC 83 02/07/2017   LDLCALC 85 08/09/2016 1229   Hepatic Function Panel     Component Value Date/Time   PROT 6.8 03/25/2017 0811   PROT 6.9 03/11/2017 0841   PROT 6.7 01/07/2017 0758   ALBUMIN 3.7 03/25/2017 0811   ALBUMIN 4.5  03/11/2017 0841   ALBUMIN 3.6 01/07/2017 0758   AST 32 03/25/2017 0811   AST 51 (H) 01/07/2017 0758   ALT 46 03/25/2017 0811   ALT 82 (H) 01/07/2017 0758   ALKPHOS 100 03/25/2017 0811   ALKPHOS 115 01/07/2017 0758   BILITOT 0.7 03/25/2017 0811   BILITOT 0.72 01/07/2017 0758      Component Value Date/Time   TSH 1.54 02/07/2017   TSH 2.170 08/09/2016 1229    ASSESSMENT AND PLAN: Other depression - with emotional eating - Plan: buPROPion (WELLBUTRIN SR) 200 MG 12 hr tablet, topiramate (TOPAMAX) 25 MG tablet  Type 2 diabetes mellitus without complication, with long-term current use of insulin ([place]) - Plan: metFORMIN (GLUCOPHAGE) 500 MG tablet, Insulin Glargine (BASAGLAR KWIKPEN) 100 UNIT/ML SOPN, Insulin Pen Needle (BD PEN NEEDLE NANO U/F) 32G X 4 MM MISC  Class 1 obesity with serious comorbidity and body mass index (BMI) of 31.0 to 31.9 in adult, unspecified obesity type  PLAN:  Diabetes II Darlene [name] has been  given extensive diabetes education by myself today including ideal fasting and post-prandial blood glucose readings, individual ideal Hgb A1c goals and hypoglycemia prevention. We discussed the importance of good blood sugar control to decrease the likelihood of diabetic complications such as nephropathy, neuropathy, limb loss, blindness, coronary artery disease, and death. We discussed the importance of intensive lifestyle modification including diet, exercise and weight loss as the first line treatment for diabetes. Adelita agrees to continue [employer] 16 units qhs and we will refill for 1 month and she agrees to continue taking metformin 500 mg qd #30 and we will refill for 1 month and refill nanoneedles for 1 month. She will check BGs BID and Darlene [name] agrees to follow up with our clinic in 2 weeks.  Depression with Emotional Eating Behaviors We discussed behavior modification techniques today to help Darlene [name] deal with her emotional eating and depression. Tobey agrees to start  topiramate 25 mg qhs #30 with no refills and she agrees to continue taking Wellbutrin SR 150 mg qd #30 and we will refill for 1 month. Darlene [name] agrees to follow up with our clinic in 2 weeks.   Obesity Darlene [name] is currently in the action stage of change. As such, her goal is to continue with weight loss efforts She has agreed to keep a food journal with 1300-1500 calories and 85+ grams of protein daily Darlene [name] has been instructed to work up to a goal of 150 minutes of combined cardio and strengthening exercise per week for weight loss and overall health benefits. We discussed the following Behavioral Modification Strategies today: increasing lean protein intake, work on meal planning and easy cooking plans, and keep a strict food journal   Darlene [name] has agreed to follow up with our clinic in 2 weeks. She was informed of the importance of frequent follow up visits to maximize her success with intensive lifestyle modifications for her multiple health conditions.   OBESITY BEHAVIORAL INTERVENTION VISIT  Today's visit was # 15 out of 22.  Starting weight: 225 lbs Starting date: 08/09/16 Today's weight : 211 lbs  Today's date: 03/26/2017 Total lbs lost to date: 14 (Patients must lose 7 lbs in the first 6 months to continue with counseling)   ASK: We discussed the diagnosis of obesity with Darlene [name] today and Darlene [name] agreed to give [name] permission to discuss obesity behavioral modification therapy today.  ASSESS: Darlene [name] has the diagnosis of obesity and her BMI today is 31.15 Tarsha is in the action stage of change   ADVISE: [place] was educated on the multiple health risks of obesity as well as the benefit of weight loss to improve her health. She was advised of the need for long term treatment and the importance of lifestyle modifications.  AGREE: Multiple dietary modification options and treatment options were discussed and  Pixie agreed to the above obesity treatment  plan.  I, [name], am acting as transcriptionist for Dennard Nip, MD  I have reviewed the above documentation for accuracy and completeness, and I agree with the above. -Dennard Nip, MD

## 2017-04-01 ENCOUNTER — Other Ambulatory Visit (INDEPENDENT_AMBULATORY_CARE_PROVIDER_SITE_OTHER): Payer: Self-pay

## 2017-04-01 ENCOUNTER — Encounter (INDEPENDENT_AMBULATORY_CARE_PROVIDER_SITE_OTHER): Payer: Self-pay | Admitting: Family Medicine

## 2017-04-01 DIAGNOSIS — E119 Type 2 diabetes mellitus without complications: Secondary | ICD-10-CM

## 2017-04-01 DIAGNOSIS — Z794 Long term (current) use of insulin: Secondary | ICD-10-CM

## 2017-04-01 MED ORDER — ONETOUCH DELICA LANCETS 33G MISC: 1.0000 | Freq: Every day | 0 refills | Status: DC

## 2017-04-01 MED ORDER — LIRAGLUTIDE 18 MG/3ML ~~LOC~~ SOPN: 1.2000 mg | PEN_INJECTOR | SUBCUTANEOUS | 0 refills | Status: DC

## 2017-04-18 ENCOUNTER — Other Ambulatory Visit (INDEPENDENT_AMBULATORY_CARE_PROVIDER_SITE_OTHER): Payer: Self-pay | Admitting: Family Medicine

## 2017-04-18 DIAGNOSIS — F3289 Other specified depressive episodes: Secondary | ICD-10-CM

## 2017-04-21 ENCOUNTER — Ambulatory Visit (INDEPENDENT_AMBULATORY_CARE_PROVIDER_SITE_OTHER): Payer: BC Managed Care – PPO | Admitting: Family Medicine

## 2017-04-21 ENCOUNTER — Other Ambulatory Visit: Payer: Self-pay

## 2017-04-21 VITALS — BP 97/65 | HR 70 | Ht 69.0 in | Wt 207.0 lb

## 2017-04-21 DIAGNOSIS — E119 Type 2 diabetes mellitus without complications: Secondary | ICD-10-CM | POA: Diagnosis not present

## 2017-04-21 DIAGNOSIS — F3289 Other specified depressive episodes: Secondary | ICD-10-CM | POA: Diagnosis not present

## 2017-04-21 DIAGNOSIS — E669 Obesity, unspecified: Secondary | ICD-10-CM | POA: Diagnosis not present

## 2017-04-21 DIAGNOSIS — Z6830 Body mass index (BMI) 30.0-30.9, adult: Secondary | ICD-10-CM

## 2017-04-21 DIAGNOSIS — Z794 Long term (current) use of insulin: Secondary | ICD-10-CM

## 2017-04-21 DIAGNOSIS — C50911 Malignant neoplasm of unspecified site of right female breast: Secondary | ICD-10-CM

## 2017-04-21 MED ORDER — TOPIRAMATE 25 MG PO TABS: 25.0000 mg | ORAL_TABLET | Freq: Every day | ORAL | 0 refills | Status: DC

## 2017-04-21 MED ORDER — BUPROPION HCL ER (SR) 200 MG PO TB12: 200.0000 mg | ORAL_TABLET | Freq: Every day | ORAL | 0 refills | Status: DC

## 2017-04-21 MED ORDER — METFORMIN HCL 500 MG PO TABS: 500.0000 mg | ORAL_TABLET | Freq: Every day | ORAL | 0 refills | Status: DC

## 2017-04-21 MED ORDER — BASAGLAR KWIKPEN 100 UNIT/ML ~~LOC~~ SOPN: 16.0000 [IU] | PEN_INJECTOR | Freq: Every day | SUBCUTANEOUS | 0 refills | Status: DC

## 2017-04-21 NOTE — Progress Notes (Signed)
[place]  [phone] [phone]   ID: Darlene [name] DOB: 12-01-1950  MR#: [id number]  FOY#:[id number]  Patient Care Team: Darlene Shanks, MD as PCP - General (Family Medicine) Himmelrich, Darlene [name], RD (Inactive) as Dietitian (Bariatrics) Stark Klein, MD as Consulting Physician (General Surgery) Magrinat, Darlene Dad, MD as Consulting Physician (Oncology) Arloa Koh, MD as Consulting Physician (Radiation Oncology) Mauro Kaufmann, RN as Registered Nurse Rockwell Germany, RN as Registered Nurse Jacelyn Pi, MD as Consulting Physician (Endocrinology) Kem Boroughs, [place] as Nurse Practitioner (Family Medicine) [name], MD as Consulting Physician (Plastic Surgery) Darlene Dresser, MD as Consulting Physician (Cardiology) Darlene Pray, MD as Consulting Physician (Radiation Oncology) PCP: Darlene Shanks, MD OTHER MD:  CHIEF COMPLAINT:  HER-2 positive, estrogen receptor negative locally recurrent disease  CURRENT TREATMENT:  Trastuzumab q. 28 days  INTERVAL HISTORY:  Darlene [name] returns today for follow-up and treatment of her estrogen receptor negative but HER-2 amplified breast cancer accompanied by her husband. She continues on trastuzumab, whch she receives every 28 days, which she tolerates well without any acute side effects.   Her next echocardiogram is scheduled for 04/24/2017.  We will be repeating  PET scan in June.     REVIEW OF SYSTEMS: Darlene [name] reports that for exercise, she has been walking while on vacation. She hasn't been walking much due to chronic foot issues (bunions and calluses). She has been evaluated by a podiatrist at this time. She was informed that she would need a left bunionectomy to help with her feet issues. She has had a prior right bunionectomy. She went on vacation to [place] recently with her husband, which she enjoyed. She has lost weight due to eating healthier and weight loss management group with  Dr. Leafy Ro. Her A1c has improved due to her recent weight loss. She is unemployed because the company went bankrupt in November, 2018. She is actively looking for another job where she will be copy writing, PR, or editing. She reports that she is due to an interview soon. She denies unusual headaches, visual changes, nausea, vomiting, or dizziness. There has been no unusual cough, phlegm production, or pleurisy. This been no change in bowel or bladder habits. She denies unexplained fatigue or unexplained weight loss, bleeding, rash, or fever. A detailed review of systems was otherwise stable.      BREAST CANCER HISTORY: From the original intake note:  "Darlene [name]" had screening mammography at her gynecologist suggestive of a possible abnormality in the right breast. However right diagnostic mammogram 04/12/2013 at [facility] was negative. More recently however, she noted a lump in her right breast and brought it to her gynecologist's attention. On 07/18/2014 the patient had bilateral diagnostic mammography with tomosynthesis at [facility]. The breast density was category B. In the right breast upper outer quadrant there was an area of focal asymmetry with indistinct margins.Darlene [name] Ultrasound was obtained and showed in addition to the mass in question, measuring 1.7 cm, an abnormal-appearing lymph node in the right axillary tail.  On 07/19/2014 the patient underwent biopsy of the right breast massin question as well as a right axillary lymph node. Both were positive for invasive ductal carcinoma, grade 2, estrogen and progesterone receptor negative, with an MIB-1 of 90%, and HER-2 pending. Incidentally the lymph node biopsy showed a lymphocytic inflammatory component but no lymph node architecture.  Her subsequent history is as detailed below.  PAST MEDICAL HISTORY: Past Medical History:  Diagnosis Date  . Anemia  hx of - during 1st round chemo  . Anxiety   . Arthritis    in fingers, shoulders  . Back pain   .  Balance problem   . Breast cancer (Darlene [name])   . Breast cancer of upper-outer quadrant of right female breast ([place]) 07/22/2014  . Bunion, right foot   . Clumsiness   . Constipation   . Depression   . Diabetes mellitus without complication ([place])    11+ years  . Eating disorder    binge eating  . Epistaxis 08/26/2014  . Fatty liver   . HCAP (healthcare-associated pneumonia) 12/18/2014  . Headache   . Heart murmur    never had any problems  . History of radiation therapy 04/05/15-05/19/15   right chest wall was treated to 50.4 Gy in 28 fractions, right mastectomy scar was treated to 10 Gy in 5 fractions  . Hyperlipidemia   . Hypertension   . Joint pain   . Multiple food allergies   . Neuromuscular disorder ([place])    diabetic neuropathy  . Obesity   . Obstructive sleep apnea on CPAP    uses cpap nightly  . Ovarian cyst rupture    possible  . Pneumonia   . Pneumonia 11/2014  . Radiation 04/05/15-05/19/15   right chest wall 50.4 Gy, mastectomy scar 10 Gy  . Sleep apnea   . Thyroid nodule 06/2014    PAST SURGICAL HISTORY: Past Surgical History:  Procedure Laterality Date  . APPENDECTOMY    . BIOPSY THYROID Bilateral 06/2014   2 nodules - Benign   . BREAST LUMPECTOMY WITH RADIOACTIVE SEED AND SENTINEL LYMPH NODE BIOPSY Right 12/27/2014   Procedure: BREAST LUMPECTOMY WITH RADIOACTIVE SEED AND SENTINEL LYMPH NODE BIOPSY;  Surgeon: Stark Klein, MD;  Location: [place];  Service: General;  Laterality: Right;  . BREAST REDUCTION SURGERY Bilateral 01/06/2015   Procedure: BILATERAL BREAST REDUCTION, RIGHT ONCOPLASTIC RECONSTRUCTION),LEFT BREAST REDUCTION FOR SYMMETRY;  Surgeon: [name], MD;  Location: [place];  Service: Plastics;  Laterality: Bilateral;  . BREATH TEK H PYLORI N/A 09/15/2012   Procedure: BREATH TEK H PYLORI;  Surgeon: Pedro Earls, MD;  Location: [facility];  Service: General;  Laterality: N/A;  . BUNIONECTOMY Left 12/2013  . CARPAL TUNNEL RELEASE  Right   . CARPAL TUNNEL RELEASE Left   . COLONOSCOPY    . DUPUYTREN CONTRACTURE RELEASE    . GASTRIC ROUX-EN-Y N/A 11/02/2012   Procedure: LAPAROSCOPIC ROUX-EN-Y GASTRIC;  Surgeon: Pedro Earls, MD;  Location: [facility];  Service: General;  Laterality: N/A;  . IR GENERIC HISTORICAL  03/18/2016   IR US GUIDE VASC ACCESS LEFT 03/18/2016 Markus Daft, MD [facility]  . IR GENERIC HISTORICAL  03/18/2016   IR FLUORO GUIDE CV LINE LEFT 03/18/2016 Markus Daft, MD [facility]  . LAPAROSCOPIC APPENDECTOMY N/A 07/22/2015   Procedure: APPENDECTOMY LAPAROSCOPIC;  Surgeon: Michael Boston, MD;  Location: [facility];  Service: General;  Laterality: N/A;  . MASTECTOMY Right 02/15/2015   modified  . MODIFIED MASTECTOMY Right 02/15/2015   Procedure: RIGHT MODIFIED RADICAL MASTECTOMY;  Surgeon: Stark Klein, MD;  Location: [place];  Service: General;  Laterality: Right;  . PORT-A-CATH REMOVAL Left 10/10/2015   Procedure: PORT REMOVAL;  Surgeon: Stark Klein, MD;  Location: [place];  Service: General;  Laterality: Left;  . PORTACATH PLACEMENT Left 08/02/2014   Procedure: INSERTION PORT-A-CATH;  Surgeon: Stark Klein, MD;  Location: [place];  Service: General;  Laterality: Left;  . PORTACATH  PLACEMENT N/A 11/05/2016   Procedure: INSERTION PORT-A-CATH;  Surgeon: Stark Klein, MD;  Location: [place];  Service: General;  Laterality: N/A;  . ROTATOR CUFF REPAIR Right 2005  . SCAR REVISION Right 12/08/2015   Procedure: COMPLEX REPAIR RIGHT CHEST 10-15CM;  Surgeon: [name], MD;  Location: [place];  Service: Plastics;  Laterality: Right;  . TOE SURGERY  1970s   bone spur  . TRIGGER FINGER RELEASE     x3    FAMILY HISTORY Family History  Problem Relation Age of Onset  . CVA Mother   . Heart disease Mother   . Sudden death Mother   . Diabetes Father   . Heart failure Father   . Hypertension Father   . Kidney disease Father   . Obesity Father   . Hypertension Sister   . Diabetes  Brother   . Breast cancer Paternal Grandmother   . Diabetes Paternal Uncle    the patient's father died from complications of diabetes at age 59. The patient's mother died at age 15 with a subarachnoid hemorrhage. The patient had one brother, one sister. The only breast cancer was the patient's paternal grandmother diagnosed in her 73s. There is no history of ovarian cancer in the family.  GYNECOLOGIC HISTORY:  Patient's last menstrual period was 10/22/2007.  menarche age 77, the patient is GX P0. She went through the change of life in 2011. She did not take hormone replacement.  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 Her husband [name] is a retired Visual merchandiser (he taught at [employer]).  [name] has 2 children from an earlier marriage, Rockey Situ who teaches in [place], and Catalina Salasar,  who works at the [employer] in in [place]. He has 1 grand son, aged 3. The patient and her husband are not church attender's    ADVANCED DIRECTIVES:  Not in place   HEALTH MAINTENANCE: Social History   Tobacco Use  . Smoking status: Never Smoker  . Smokeless tobacco: Never Used  Substance Use Topics  . Alcohol use: Yes    Alcohol/week: 0.0 oz    Comment: social  . Drug use: No     Colonoscopy:  May 2016  PAP:  Bone density: 07/05/2013 normal, with a T score of 0.4  Lipid panel:  Allergies  Allergen Reactions  . Nsaids Other (See Comments)    H/o gastric bypass - avoid NSAIDs  . Tape Hives and Rash    Adhesives in bandaids    Current Outpatient Medications  Medication Sig Dispense Refill  . acetaminophen (TYLENOL) 325 MG tablet Take 650 mg by mouth 2 (two) times daily as needed for moderate pain or headache. Reported on 04/17/2015    . buPROPion (WELLBUTRIN SR) 200 MG 12 hr tablet Take 1 tablet (200 mg total) by mouth daily at 12 noon. 30 tablet 0    . calcium citrate-vitamin D (CITRACAL+D) 315-200 MG-UNIT tablet Take 1 tablet by mouth 3 (three) times daily.    . cholecalciferol (VITAMIN D) 1000 units tablet Take 1,000 Units by mouth daily.    . diphenhydrAMINE (BENADRYL) 25 MG tablet Take 25 mg by mouth daily as needed for allergies or sleep. Reported on 03/06/2015    . glucose blood (ONETOUCH VERIO) test strip 1 each by Other route as needed for other. Reported on 03/06/2015    . hydrochlorothiazide (HYDRODIURIL) 25  MG tablet TAKE 1 TABLET BY MOUTH EVERY DAY 30 tablet 11  . Insulin Glargine (BASAGLAR KWIKPEN) 100 UNIT/ML SOPN Inject 0.16 mLs (16 Units total) into the skin at bedtime. 2 pen 0  . Insulin Pen Needle (BD PEN NEEDLE NANO U/F) 32G X 4 MM MISC 1 Package by Does not apply route daily at 12 noon. 100 each 0  . liraglutide (VICTOZA) 18 MG/3ML SOPN Inject 0.2 mLs (1.2 mg total) into the skin every morning. 2 pen 0  . losartan (COZAAR) 100 MG tablet Take 1 tablet by mouth daily. Reported on 03/06/2015    . Melatonin 5 MG TABS Take 5 mg by mouth at bedtime. Reported on 03/06/2015    . metFORMIN (GLUCOPHAGE) 500 MG tablet Take 1 tablet (500 mg total) by mouth daily. 30 tablet 0  . mometasone (NASONEX) 50 MCG/ACT nasal spray Place 1-2 sprays into the nose daily as needed (for allergies.).     Darlene [name] ONETOUCH DELICA LANCETS 40J MISC 1 Package by Does not apply route daily. 100 each 0  . Pediatric Multivitamins-Iron (FLINTSTONES PLUS IRON) chewable tablet Chew 1 tablet by mouth 2 (two) times daily.    . Probiotic Product ([employer] PROBIOTIC PO) Take 1 capsule by mouth daily. Reported on 03/06/2015    . Propylene Glycol (SYSTANE BALANCE OP) Apply 1-2 drops to eye 3 (three) times daily.    Darlene [name] senna-docusate (SENNA PLUS) 8.6-50 MG tablet Take 2 tablets by mouth at bedtime.    . topiramate (TOPAMAX) 25 MG tablet Take 1 tablet (25 mg total) by mouth daily. 30 tablet 0  . vortioxetine HBr (TRINTELLIX) 20 MG TABS Take 20 mg by mouth at bedtime. 30 tablet 0   No  current facility-administered medications for this visit.     OBJECTIVE: Relation white woman looks well Vitals:   04/22/17 0932  BP: (!) 107/58  Pulse: 76  Resp: 18  Temp: 97.8 F (36.6 C)  SpO2: 100%     Body mass index is 30.86 kg/m.    ECOG FS:0 - Asymptomatic Filed Weights   04/22/17 0932  Weight: 209 lb (94.8 kg)   Sclerae unicteric, pupils round and equal Oropharynx clear and moist No cervical or supraclavicular adenopathy Lungs no rales or rhonchi Heart regular rate and rhythm Abd soft, nontender, positive bowel sounds MSK no focal spinal tenderness, no upper extremity lymphedema Neuro: nonfocal, well oriented, appropriate affect Breasts: That is post right mastectomy with no evidence of chest wall recurrence.  Left breast is benign.  Both axillae are benign.  LAB RESULTS:  CMP     Component Value Date/Time   NA 138 03/25/2017 0811   NA 143 03/11/2017 0841   NA 140 01/07/2017 0758   K 4.0 03/25/2017 0811   K 4.1 01/07/2017 0758   CL 108 03/25/2017 0811   CO2 21 (L) 03/25/2017 0811   CO2 24 01/07/2017 0758   GLUCOSE 145 (H) 03/25/2017 0811   GLUCOSE 134 01/07/2017 0758   BUN 44 (H) 03/25/2017 0811   BUN 40 (H) 03/11/2017 0841   BUN 33.6 (H) 01/07/2017 0758   CREATININE 1.68 (H) 03/25/2017 0811   CREATININE 1.5 (H) 01/07/2017 0758   CALCIUM 9.6 03/25/2017 0811   CALCIUM 9.3 01/07/2017 0758   PROT 6.8 03/25/2017 0811   PROT 6.9 03/11/2017 0841   PROT 6.7 01/07/2017 0758   ALBUMIN 3.7 03/25/2017 0811   ALBUMIN 4.5 03/11/2017 0841   ALBUMIN 3.6 01/07/2017 0758   AST 32 03/25/2017 0811   AST  51 (H) 01/07/2017 0758   ALT 46 03/25/2017 0811   ALT 82 (H) 01/07/2017 0758   ALKPHOS 100 03/25/2017 0811   ALKPHOS 115 01/07/2017 0758   BILITOT 0.7 03/25/2017 0811   BILITOT 0.72 01/07/2017 0758   GFRNONAA 31 (L) 03/25/2017 0811   GFRNONAA 55 (L) 02/18/2013 0827   GFRAA 36 (L) 03/25/2017 0811   GFRAA 63 02/18/2013 0827    INo results found for: SPEP,  UPEP  Lab Results  Component Value Date   WBC 4.8 04/22/2017   NEUTROABS 3.0 04/22/2017   HGB 11.1 (L) 02/25/2017   HCT 34.9 04/22/2017   MCV 92.1 04/22/2017   PLT 158 04/22/2017      Chemistry      Component Value Date/Time   NA 138 03/25/2017 0811   NA 143 03/11/2017 0841   NA 140 01/07/2017 0758   K 4.0 03/25/2017 0811   K 4.1 01/07/2017 0758   CL 108 03/25/2017 0811   CO2 21 (L) 03/25/2017 0811   CO2 24 01/07/2017 0758   BUN 44 (H) 03/25/2017 0811   BUN 40 (H) 03/11/2017 0841   BUN 33.6 (H) 01/07/2017 0758   CREATININE 1.68 (H) 03/25/2017 0811   CREATININE 1.5 (H) 01/07/2017 0758      Component Value Date/Time   CALCIUM 9.6 03/25/2017 0811   CALCIUM 9.3 01/07/2017 0758   ALKPHOS 100 03/25/2017 0811   ALKPHOS 115 01/07/2017 0758   AST 32 03/25/2017 0811   AST 51 (H) 01/07/2017 0758   ALT 46 03/25/2017 0811   ALT 82 (H) 01/07/2017 0758   BILITOT 0.7 03/25/2017 0811   BILITOT 0.72 01/07/2017 0758       No results found for: LABCA2  No components found for: OJJKK938  No results for input(s): INR in the last 168 hours.  Urinalysis    Component Value Date/Time   COLORURINE YELLOW 07/21/2015 [place] 07/21/2015 2328   LABSPEC 1.020 07/21/2015 2328   PHURINE 6.0 07/21/2015 2328   GLUCOSEU NEGATIVE 07/21/2015 2328   HGBUR TRACE (A) 07/21/2015 2328   BILIRUBINUR NEGATIVE 07/21/2015 2328   BILIRUBINUR neg 06/29/2013 1017   KETONESUR NEGATIVE 07/21/2015 2328   PROTEINUR 30 (A) 07/21/2015 2328   UROBILINOGEN negative 06/29/2013 1017   NITRITE NEGATIVE 07/21/2015 2328   LEUKOCYTESUR NEGATIVE 07/21/2015 2328    STUDIES: Scheduled for echocardiogram later this week.  Consider mammography sometime this year  ASSESSMENT: 68 y.o. [place] woman status post right breast upper outer quadrant and right axillary lymph node biopsy 07/19/2014 for a cT2  pN1, stage IIB  invasive ductal carcinoma, grade 2, estrogen and progesterone receptor negative,  with an MIB-1 of 90%, and HER-2 amplified  (1) neoadjuvant chemotherapy started 08/08/2014, consisting of carboplatin, docetaxel, trastuzumab and pertuzumab given every 3 weeks 6, completed 11/21/2014  (a) breast MRI after initial 3 cycles shows a significant response  (b) breast MRI after 6 cycles shows a complete radiologic response   (2) trastuzumab continued through 10/02/2015 (to end of capecitabine therapy)  (a) echo 02/13/2015 shows an EF of 60-65%  (b)  echo 05/09/2015 shows an ejection fraction of 60-65%  (c) echocardiogram 06/27/2015 shows an ejection fraction of 55-60%.  (d) echocardiogram 01/22/2017 shows an ejection fraction of 55-60%  (3) right lumpectomy with sentinel lymph node biopsy on 12/27/14 showed a residual  ypT2 ypN1a, stage IIB invasive ductal carcinoma, grade 2, with repeat estrogen and progesterone receptors again negative  (4) status post right oncoplastic surgery (with left  reduction mammoplasty) 01/06/2015 showing in the right breast multiple foci of intralymphatic carcinoma in random sections  (5)  Right modified radical mastectomy 02/15/2014 showed no residual invasive disease; there was DCIS but margins were negative; all 15 axillary lymph nodes were clear; ypTis ypN0  (a)  The patient has decided against reconstruction.  (6)  Postmastectomy radiation completed 05/18/2015 with capecitabine sensitization  (7) adjuvant capecitabine continued through 09/27/2015  (8) hepatic steatosis with increased fibrosis score (at risk for cirrhosis)  (9) thyroid biopsy (left lobe inferiorly) 2 shows benign tissue 06/28/2015  (10) LOCAL RECURRENCE:  (a) PET scan 12/06/2015 Notes a right retropectoral lymph node measuring 0.9 cm with an SUV max of 5.5  (b) repeat PET scan 03/11/2016 finds the right subpectoral lymph node to now measure 1.3 cm with an SUV max of 11.8.  (c) PET scan 05/07/2016 shows a significant response to T-DM 1   (11) started T-DM1 03/21/2016,  completed 8 doses 08/13/2016  (a) PET scan 08/19/2016 shows a good response but measurable residual disease  (b) PET scan on 01/16/2017 shows a continuing response  (12) maintenance trastuzumab started 09/03/2016--changed to every 28 days as of 01/28/2017  (a) repeat echocardiogram 07/08/2016 shows an ejection fraction of 60-65%.  (b) echocardiogram on 10/08/2016 demonstrates LVEF of 55-60% (followed by Dr. Haroldine Laws)  (c) echo [phone] 2019 shows an ejection fraction in the 55-60% range  (13)  radiation to the area of persistent disease in the right upper chest completed 10/30/2016  PLAN: Darlene [name] is now about a year and a half out from local recurrence of her breast cancer, with no clinical evidence of disease.  She is tolerating the trastuzumab with no side effects that she is aware.  We will continue to follow echoes currently every 3 months but we will leave that at the discretion of the cardio oncologist.  It would not surprise me if we moved her to every 6 months soon  This is the best of ever seeing Darlene [name] in terms of health.  She is on a terrific diet.  I have strongly suggested that she join a gym since she has difficulty walking because of foot problems.  She should use an exercise bike or swim for cardio and she should do this 5 times a week  She is going to see me again in 3 months.  Before that visit she will have a repeat PET scan.  I am expecting good news  She will have a repeat mammogram probably October of this year  She knows to call for any other issues that may develop before the next visit.  Magrinat, Darlene Dad, MD  04/22/17 9:56 AM Medical Oncology and Hematology [facility] [address] [place], [place] [address] Tel. [phone]    Fax. [phone]  This document serves as a record of services personally performed by Lurline Del, MD. It was created on his behalf by Steva Colder, a trained medical scribe. The creation of this record is based on the  scribe's personal observations and the provider's statements to them.   I have reviewed the above documentation for accuracy and completeness, and I agree with the above.

## 2017-04-21 NOTE — Progress Notes (Signed)
Office: [phone]  /  Fax: [phone]   HPI:   Chief Complaint: OBESITY Darlene [name] is here to discuss her progress with her obesity treatment plan. She is on the keep a food journal with 1300-1500 calories and 85+ grams of protein daily and is following her eating plan approximately 90 % of the time. She states she is exercising 0 minutes 0 times per week. Darlene [name] did very well with weight loss even on vacation. She is still struggling with emotional eating.  Her weight is 207 lb (93.9 kg) today and has had a weight loss of 4 pounds over a period of 3 to 4 weeks since her last visit. She has lost 18 lbs since starting treatment with [name].  Diabetes II Darlene [name] has a diagnosis of diabetes type II. Secret states fasting BGs range between 86 and 160, mostly 90 and 120's, and 2 hour post prandial range between 120 and 200, mostly under 150. She denies any hypoglycemic episodes. Last A1c was 6.1 on 03/11/17. She has been working on intensive lifestyle modifications including diet, exercise, and weight loss to help control her blood glucose levels.  Depression with emotional eating behaviors Darlene [name]'s mood is stable on medications but she is struggling with emotional eating. She has some health and financial worries as well. Darlene [name] struggles with emotional eating and using food for comfort to the extent that it is negatively impacting her health. She often snacks when she is not hungry. Darlene [name] sometimes feels she is out of control and then feels guilty that she made poor food choices. She has been working on behavior modification techniques to help reduce her emotional eating and has been somewhat successful. She shows no sign of suicidal or homicidal ideations.  Depression screen [facility] 2/9 09/12/2016 08/09/2016 01/01/2016 11/15/2015 10/12/2015  Decreased Interest 0 1 0 0 0  Down, Depressed, Hopeless 0 1 0 0 0  PHQ - 2 Score 0 2 0 0 0  Altered sleeping - 0 - - -  Tired, decreased energy - 1 - -  -  Change in appetite - 2 - - -  Feeling bad or failure about yourself  - 2 - - -  Trouble concentrating - 1 - - -  Moving slowly or fidgety/restless - 1 - - -  Suicidal thoughts - 0 - - -  PHQ-9 Score - 9 - - -  Some recent data might be hidden   ALLERGIES: Allergies  Allergen Reactions  . Nsaids Other (See Comments)    H/o gastric bypass - avoid NSAIDs  . Tape Hives and Rash    Adhesives in bandaids    MEDICATIONS: Current Outpatient Medications on File Prior to Visit  Medication Sig Dispense Refill  . acetaminophen (TYLENOL) 325 MG tablet Take 650 mg by mouth 2 (two) times daily as needed for moderate pain or headache. Reported on 04/17/2015    . buPROPion (WELLBUTRIN SR) 200 MG 12 hr tablet Take 1 tablet (200 mg total) by mouth daily at 12 noon. 30 tablet 0  . calcium citrate-vitamin D (CITRACAL+D) 315-200 MG-UNIT tablet Take 1 tablet by mouth 3 (three) times daily.    . cholecalciferol (VITAMIN D) 1000 units tablet Take 1,000 Units by mouth daily.    . diphenhydrAMINE (BENADRYL) 25 MG tablet Take 25 mg by mouth daily as needed for allergies or sleep. Reported on 03/06/2015    . glucose blood (ONETOUCH VERIO) test strip 1 each by Other route as needed for other. Reported on 03/06/2015    .  hydrochlorothiazide (HYDRODIURIL) 25 MG tablet TAKE 1 TABLET BY MOUTH EVERY DAY 30 tablet 11  . Insulin Glargine (BASAGLAR KWIKPEN) 100 UNIT/ML SOPN Inject 0.16 mLs (16 Units total) into the skin at bedtime. 2 pen 0  . Insulin Pen Needle (BD PEN NEEDLE NANO U/F) 32G X 4 MM MISC 1 Package by Does not apply route daily at 12 noon. 100 each 0  . liraglutide (VICTOZA) 18 MG/3ML SOPN Inject 0.2 mLs (1.2 mg total) into the skin every morning. 2 pen 0  . losartan (COZAAR) 100 MG tablet Take 1 tablet by mouth daily. Reported on 03/06/2015    . Melatonin 5 MG TABS Take 5 mg by mouth at bedtime. Reported on 03/06/2015    . metFORMIN (GLUCOPHAGE) 500 MG tablet Take 1 tablet (500 mg total) by mouth daily. 30  tablet 0  . mometasone (NASONEX) 50 MCG/ACT nasal spray Place 1-2 sprays into the nose daily as needed (for allergies.).     [name] ONETOUCH DELICA LANCETS 33A MISC 1 Package by Does not apply route daily. 100 each 0  . Pediatric Multivitamins-Iron (FLINTSTONES PLUS IRON) chewable tablet Chew 1 tablet by mouth 2 (two) times daily.    . Probiotic Product ([employer] PROBIOTIC PO) Take 1 capsule by mouth daily. Reported on 03/06/2015    . Propylene Glycol (SYSTANE BALANCE OP) Apply 1-2 drops to eye 3 (three) times daily.    [name] senna-docusate (SENNA PLUS) 8.6-50 MG tablet Take 2 tablets by mouth at bedtime.    . topiramate (TOPAMAX) 25 MG tablet Take 1 tablet (25 mg total) by mouth daily. 30 tablet 0  . vortioxetine HBr (TRINTELLIX) 20 MG TABS Take 20 mg by mouth at bedtime. 30 tablet 0   No current facility-administered medications on file prior to visit.     PAST MEDICAL HISTORY: Past Medical History:  Diagnosis Date  . Anemia    hx of - during 1st round chemo  . Anxiety   . Arthritis    in fingers, shoulders  . Back pain   . Balance problem   . Breast cancer ([place])   . Breast cancer of upper-outer quadrant of right female breast ([place]) 07/22/2014  . Bunion, right foot   . Clumsiness   . Constipation   . Depression   . Diabetes mellitus without complication ([place])    25+ years  . Eating disorder    binge eating  . Epistaxis 08/26/2014  . Fatty liver   . HCAP (healthcare-associated pneumonia) 12/18/2014  . Headache   . Heart murmur    never had any problems  . History of radiation therapy 04/05/15-05/19/15   right chest wall was treated to 50.4 Gy in 28 fractions, right mastectomy scar was treated to 10 Gy in 5 fractions  . Hyperlipidemia   . Hypertension   . Joint pain   . Multiple food allergies   . Neuromuscular disorder ([place])    diabetic neuropathy  . Obesity   . Obstructive sleep apnea on CPAP    uses cpap nightly  . Ovarian cyst rupture    possible  . Pneumonia   . Pneumonia 11/2014   . Radiation 04/05/15-05/19/15   right chest wall 50.4 Gy, mastectomy scar 10 Gy  . Sleep apnea   . Thyroid nodule 06/2014    PAST SURGICAL HISTORY: Past Surgical History:  Procedure Laterality Date  . APPENDECTOMY    . BIOPSY THYROID Bilateral 06/2014   2 nodules - Benign   . BREAST LUMPECTOMY WITH RADIOACTIVE SEED  AND SENTINEL LYMPH NODE BIOPSY Right 12/27/2014   Procedure: BREAST LUMPECTOMY WITH RADIOACTIVE SEED AND SENTINEL LYMPH NODE BIOPSY;  Surgeon: Stark Klein, MD;  Location: [place];  Service: General;  Laterality: Right;  . BREAST REDUCTION SURGERY Bilateral 01/06/2015   Procedure: BILATERAL BREAST REDUCTION, RIGHT ONCOPLASTIC RECONSTRUCTION),LEFT BREAST REDUCTION FOR SYMMETRY;  Surgeon: [name], MD;  Location: [place];  Service: Plastics;  Laterality: Bilateral;  . BREATH TEK H PYLORI N/A 09/15/2012   Procedure: BREATH TEK H PYLORI;  Surgeon: Pedro Earls, MD;  Location: [facility];  Service: General;  Laterality: N/A;  . BUNIONECTOMY Left 12/2013  . CARPAL TUNNEL RELEASE Right   . CARPAL TUNNEL RELEASE Left   . COLONOSCOPY    . DUPUYTREN CONTRACTURE RELEASE    . GASTRIC ROUX-EN-Y N/A 11/02/2012   Procedure: LAPAROSCOPIC ROUX-EN-Y GASTRIC;  Surgeon: Pedro Earls, MD;  Location: [facility];  Service: General;  Laterality: N/A;  . IR GENERIC HISTORICAL  03/18/2016   IR US GUIDE VASC ACCESS LEFT 03/18/2016 Markus Daft, MD [facility]  . IR GENERIC HISTORICAL  03/18/2016   IR FLUORO GUIDE CV LINE LEFT 03/18/2016 Markus Daft, MD [facility]  . LAPAROSCOPIC APPENDECTOMY N/A 07/22/2015   Procedure: APPENDECTOMY LAPAROSCOPIC;  Surgeon: Michael Boston, MD;  Location: [facility];  Service: General;  Laterality: N/A;  . MASTECTOMY Right 02/15/2015   modified  . MODIFIED MASTECTOMY Right 02/15/2015   Procedure: RIGHT MODIFIED RADICAL MASTECTOMY;  Surgeon: Stark Klein, MD;  Location: [place];  Service: General;  Laterality: Right;  . PORT-A-CATH REMOVAL Left 10/10/2015    Procedure: PORT REMOVAL;  Surgeon: Stark Klein, MD;  Location: [place];  Service: General;  Laterality: Left;  . PORTACATH PLACEMENT Left 08/02/2014   Procedure: INSERTION PORT-A-CATH;  Surgeon: Stark Klein, MD;  Location: [place];  Service: General;  Laterality: Left;  . PORTACATH PLACEMENT N/A 11/05/2016   Procedure: INSERTION PORT-A-CATH;  Surgeon: Stark Klein, MD;  Location: [place];  Service: General;  Laterality: N/A;  . ROTATOR CUFF REPAIR Right 2005  . SCAR REVISION Right 12/08/2015   Procedure: COMPLEX REPAIR RIGHT CHEST 10-15CM;  Surgeon: [name], MD;  Location: [place];  Service: Plastics;  Laterality: Right;  . TOE SURGERY  1970s   bone spur  . TRIGGER FINGER RELEASE     x3    SOCIAL HISTORY: Social History   Tobacco Use  . Smoking status: Never Smoker  . Smokeless tobacco: Never Used  Substance Use Topics  . Alcohol use: Yes    Alcohol/week: 0.0 oz    Comment: social  . Drug use: No    FAMILY HISTORY: Family History  Problem Relation Age of Onset  . CVA Mother   . Heart disease Mother   . Sudden death Mother   . Diabetes Father   . Heart failure Father   . Hypertension Father   . Kidney disease Father   . Obesity Father   . Hypertension Sister   . Diabetes Brother   . Breast cancer Paternal Grandmother   . Diabetes Paternal Uncle     ROS: Review of Systems  Constitutional: Positive for weight loss.  Endo/Heme/Allergies:       Negative hypoglycemia  Psychiatric/Behavioral: Positive for depression. Negative for suicidal ideas.    PHYSICAL EXAM: Blood pressure 97/65, pulse 70, height 5\' 9"  (1.753 m), weight 207 lb (93.9 kg), last menstrual period 10/22/2007, SpO2 99 %. Body mass index is 30.57 kg/m. Physical Exam  Constitutional: She is oriented to person, place, and time. She appears well-developed and well-nourished.  Cardiovascular: Normal rate.  Pulmonary/Chest: Effort normal.  Musculoskeletal:  Normal range of motion.  Neurological: She is oriented to person, place, and time.  Skin: Skin is warm and dry.  Psychiatric: She has a normal mood and affect. Her behavior is normal.  Vitals reviewed.   RECENT LABS AND TESTS: BMET    Component Value Date/Time   NA 138 03/25/2017 0811   NA 143 03/11/2017 0841   NA 140 01/07/2017 0758   K 4.0 03/25/2017 0811   K 4.1 01/07/2017 0758   CL 108 03/25/2017 0811   CO2 21 (L) 03/25/2017 0811   CO2 24 01/07/2017 0758   GLUCOSE 145 (H) 03/25/2017 0811   GLUCOSE 134 01/07/2017 0758   BUN 44 (H) 03/25/2017 0811   BUN 40 (H) 03/11/2017 0841   BUN 33.6 (H) 01/07/2017 0758   CREATININE 1.68 (H) 03/25/2017 0811   CREATININE 1.5 (H) 01/07/2017 0758   CALCIUM 9.6 03/25/2017 0811   CALCIUM 9.3 01/07/2017 0758   GFRNONAA 31 (L) 03/25/2017 0811   GFRNONAA 55 (L) 02/18/2013 0827   GFRAA 36 (L) 03/25/2017 0811   GFRAA 63 02/18/2013 0827   Lab Results  Component Value Date   HGBA1C 6.1 (H) 03/11/2017   HGBA1C 8.2 (H) 11/05/2016   HGBA1C 8.1 (H) 08/09/2016   HGBA1C 6.5 (H) 01/03/2015   HGBA1C 6.9 (H) 02/18/2013   Lab Results  Component Value Date   INSULIN 15.3 03/11/2017   INSULIN 18.3 08/09/2016   CBC    Component Value Date/Time   WBC 5.0 03/25/2017 0811   WBC 6.1 02/25/2017 0845   RBC 3.61 (L) 03/25/2017 0811   HGB 11.1 (L) 02/25/2017 0845   HGB 10.8 (L) 01/07/2017 0758   HCT 33.4 (L) 03/25/2017 0811   HCT 31.9 (L) 01/07/2017 0758   PLT 148 03/25/2017 0811   PLT 126 (L) 01/07/2017 0758   MCV 92.5 03/25/2017 0811   MCV 90.8 01/07/2017 0758   MCH 31.0 03/25/2017 0811   MCHC 33.5 03/25/2017 0811   RDW 13.6 03/25/2017 0811   RDW 14.8 (H) 01/07/2017 0758   LYMPHSABS 1.2 03/25/2017 0811   LYMPHSABS 1.1 01/07/2017 0758   MONOABS 0.4 03/25/2017 0811   MONOABS 0.3 01/07/2017 0758   EOSABS 0.2 03/25/2017 0811   EOSABS 0.1 01/07/2017 0758   EOSABS 0.1 08/09/2016 1229   BASOSABS 0.0 03/25/2017 0811   BASOSABS 0.0 01/07/2017  0758   Iron/TIBC/Ferritin/ %Sat    Component Value Date/Time   IRON 58 02/18/2013 0827   TIBC 305 02/18/2013 0827   FERRITIN 133 10/31/2014 0816   IRONPCTSAT 19 (L) 02/18/2013 0827   Lipid Panel     Component Value Date/Time   CHOL 164 02/07/2017   CHOL 182 08/09/2016 1229   TRIG 114 02/07/2017   HDL 59 02/07/2017   HDL 62 08/09/2016 1229   CHOLHDL 2.9 02/18/2013 0827   VLDL 23 02/18/2013 0827   LDLCALC 83 02/07/2017   LDLCALC 85 08/09/2016 1229   Hepatic Function Panel     Component Value Date/Time   PROT 6.8 03/25/2017 0811   PROT 6.9 03/11/2017 0841   PROT 6.7 01/07/2017 0758   ALBUMIN 3.7 03/25/2017 0811   ALBUMIN 4.5 03/11/2017 0841   ALBUMIN 3.6 01/07/2017 0758   AST 32 03/25/2017 0811   AST 51 (H) 01/07/2017 0758   ALT 46 03/25/2017 0811   ALT 82 (H) 01/07/2017 0737  ALKPHOS 100 03/25/2017 0811   ALKPHOS 115 01/07/2017 0758   BILITOT 0.7 03/25/2017 0811   BILITOT 0.72 01/07/2017 0758      Component Value Date/Time   TSH 1.54 02/07/2017   TSH 2.170 08/09/2016 1229    ASSESSMENT AND PLAN: Type 2 diabetes mellitus without complication, with long-term current use of insulin (HCC) - Plan: Insulin Glargine (BASAGLAR KWIKPEN) 100 UNIT/ML SOPN, metFORMIN (GLUCOPHAGE) 500 MG tablet  Other depression - Plan: buPROPion (WELLBUTRIN SR) 200 MG 12 hr tablet, topiramate (TOPAMAX) 25 MG tablet  Class 1 obesity with serious comorbidity and body mass index (BMI) of 30.0 to 30.9 in adult, unspecified obesity type  PLAN:  Diabetes II Darlene [name] has been given extensive diabetes education by myself today including ideal fasting and post-prandial blood glucose readings, individual ideal Hgb A1c goals and hypoglycemia prevention. We discussed the importance of good blood sugar control to decrease the likelihood of diabetic complications such as nephropathy, neuropathy, limb loss, blindness, coronary artery disease, and death. We discussed the importance of intensive lifestyle  modification including diet, exercise and weight loss as the first line treatment for diabetes. Darlene [name] agrees to continue [employer] 2 pens 16 units qhs and we will refill for 1 month, she agrees to continue taking metformin 500 mg qd #30 and we will refill for 1 month, and she agrees to continue Victoza as is. Darlene [name] agrees to follow up with our clinic in 2 to 3 weeks.   Depression with Emotional Eating Behaviors We discussed behavior modification techniques today to help Darlene [name] deal with her emotional eating and depression. Darlene [name] agrees to continue taking Wellbutrin SR 200 qd #30 and we will refill for 1 month, she agrees to continue taking topiramate 25 mg qd #30 and we will refill for 1 month, and she agrees to continue Trintellix as is. Darlene [name] agrees to follow up with our clinic in 2 to 3 weeks.  Obesity Darlene [name] is currently in the action stage of change. As such, her goal is to continue with weight loss efforts She has agreed to keep a food journal with 1300-1500 calories and 80+ grams of protein daily Darlene [name] has been instructed to work up to a goal of 150 minutes of combined cardio and strengthening exercise per week for weight loss and overall health benefits. We discussed the following Behavioral Modification Strategies today: increasing lean protein intake, dealing with family or coworker sabotage, and travel eating strategies   Darlene [name] has agreed to follow up with our clinic in 2 to 3 weeks. She was informed of the importance of frequent follow up visits to maximize her success with intensive lifestyle modifications for her multiple health conditions.   OBESITY BEHAVIORAL INTERVENTION VISIT  Today's visit was # 16 out of 22.  Starting weight: 225 lbs Starting date: 08/09/16 Today's weight : 207 lbs Today's date: 04/21/2017 Total lbs lost to date: 15 (Patients must lose 7 lbs in the first 6 months to continue with counseling)   ASK: We discussed the diagnosis of  obesity with Darlene [name] today and Darlene [name] agreed to give [name] permission to discuss obesity behavioral modification therapy today.  ASSESS: Darlene [name] has the diagnosis of obesity and her BMI today is 30.55 Darlene [name] is in the action stage of change   ADVISE: Darlene [name] was educated on the multiple health risks of obesity as well as the benefit of weight loss to improve her health. She was advised of the need for long term treatment and the importance of lifestyle modifications.  AGREE: Multiple dietary modification options and treatment options were discussed and  Darlene [name] agreed to the above obesity treatment plan.  I, [name], am acting as transcriptionist for Dennard Nip, MD  I have reviewed the above documentation for accuracy and completeness, and I agree with the above. -Dennard Nip, MD

## 2017-04-22 ENCOUNTER — Inpatient Hospital Stay: Payer: BC Managed Care – PPO

## 2017-04-22 ENCOUNTER — Inpatient Hospital Stay: Payer: BC Managed Care – PPO | Attending: Oncology

## 2017-04-22 ENCOUNTER — Inpatient Hospital Stay (HOSPITAL_BASED_OUTPATIENT_CLINIC_OR_DEPARTMENT_OTHER): Payer: BC Managed Care – PPO | Admitting: Oncology

## 2017-04-22 VITALS — BP 107/58 | HR 76 | Temp 97.8°F | Resp 18 | Ht 69.0 in | Wt 209.0 lb

## 2017-04-22 DIAGNOSIS — Z171 Estrogen receptor negative status [ER-]: Secondary | ICD-10-CM

## 2017-04-22 DIAGNOSIS — C773 Secondary and unspecified malignant neoplasm of axilla and upper limb lymph nodes: Secondary | ICD-10-CM | POA: Insufficient documentation

## 2017-04-22 DIAGNOSIS — C50411 Malignant neoplasm of upper-outer quadrant of right female breast: Secondary | ICD-10-CM | POA: Insufficient documentation

## 2017-04-22 DIAGNOSIS — I700 Atherosclerosis of aorta: Secondary | ICD-10-CM

## 2017-04-22 DIAGNOSIS — E119 Type 2 diabetes mellitus without complications: Secondary | ICD-10-CM

## 2017-04-22 DIAGNOSIS — Z95828 Presence of other vascular implants and grafts: Secondary | ICD-10-CM

## 2017-04-22 DIAGNOSIS — C50911 Malignant neoplasm of unspecified site of right female breast: Secondary | ICD-10-CM

## 2017-04-22 DIAGNOSIS — Z5112 Encounter for antineoplastic immunotherapy: Secondary | ICD-10-CM | POA: Insufficient documentation

## 2017-04-22 DIAGNOSIS — Z794 Long term (current) use of insulin: Secondary | ICD-10-CM

## 2017-04-22 LAB — CBC WITH DIFFERENTIAL (CANCER CENTER ONLY)
Basophils Absolute: 0 10*3/uL (ref 0.0–0.1)
Basophils Relative: 0 %
Eosinophils Absolute: 0.1 10*3/uL (ref 0.0–0.5)
Eosinophils Relative: 2 %
HCT: 34.9 % (ref 34.8–46.6)
Hemoglobin: 11.9 g/dL (ref 11.6–15.9)
Lymphocytes Relative: 27 %
Lymphs Abs: 1.3 10*3/uL (ref 0.9–3.3)
MCH: 31.4 pg (ref 25.1–34.0)
MCHC: 34.1 g/dL (ref 31.5–36.0)
MCV: 92.1 fL (ref 79.5–101.0)
Monocytes Absolute: 0.4 10*3/uL (ref 0.1–0.9)
Monocytes Relative: 9 %
Neutro Abs: 3 10*3/uL (ref 1.5–6.5)
Neutrophils Relative %: 62 %
Platelet Count: 158 10*3/uL (ref 145–400)
RBC: 3.79 MIL/uL (ref 3.70–5.45)
RDW: 13.7 % (ref 11.2–14.5)
WBC Count: 4.8 10*3/uL (ref 3.9–10.3)

## 2017-04-22 LAB — CMP (CANCER CENTER ONLY)
ALT: 35 U/L (ref 0–55)
AST: 30 U/L (ref 5–34)
Albumin: 4 g/dL (ref 3.5–5.0)
Alkaline Phosphatase: 99 U/L (ref 40–150)
Anion gap: 9 (ref 3–11)
BUN: 61 mg/dL — ABNORMAL HIGH (ref 7–26)
CO2: 21 mmol/L — ABNORMAL LOW (ref 22–29)
Calcium: 9.6 mg/dL (ref 8.4–10.4)
Chloride: 109 mmol/L (ref 98–109)
Creatinine: 2.21 mg/dL — ABNORMAL HIGH (ref 0.60–1.10)
GFR, Est AFR Am: 26 mL/min — ABNORMAL LOW (ref >60)
GFR, Estimated: 22 mL/min — ABNORMAL LOW (ref >60)
Glucose, Bld: 174 mg/dL — ABNORMAL HIGH (ref 70–140)
Potassium: 4 mmol/L (ref 3.5–5.1)
Sodium: 139 mmol/L (ref 136–145)
Total Bilirubin: 0.7 mg/dL (ref 0.2–1.2)
Total Protein: 7 g/dL (ref 6.4–8.3)

## 2017-04-22 MED ORDER — DIPHENHYDRAMINE HCL 12.5 MG/5ML PO ELIX
ORAL_SOLUTION | ORAL | Status: AC
  Filled 2017-04-22: qty 5

## 2017-04-22 MED ORDER — ACETAMINOPHEN 325 MG PO TABS
650.0000 mg | ORAL_TABLET | Freq: Once | ORAL | Status: AC
  Administered 2017-04-22: 650 mg via ORAL

## 2017-04-22 MED ORDER — SODIUM CHLORIDE 0.9 % IV SOLN
600.0000 mg | Freq: Once | INTRAVENOUS | Status: AC
  Administered 2017-04-22: 600 mg via INTRAVENOUS
  Filled 2017-04-22: qty 28.57

## 2017-04-22 MED ORDER — ACETAMINOPHEN 325 MG PO TABS
ORAL_TABLET | ORAL | Status: AC
  Filled 2017-04-22: qty 2

## 2017-04-22 MED ORDER — HEPARIN SOD (PORK) LOCK FLUSH 100 UNIT/ML IV SOLN
500.0000 [IU] | Freq: Once | INTRAVENOUS | Status: AC | PRN
  Administered 2017-04-22: 500 [IU]
  Filled 2017-04-22: qty 5

## 2017-04-22 MED ORDER — SODIUM CHLORIDE 0.9 % IV SOLN
Freq: Once | INTRAVENOUS | Status: AC
  Administered 2017-04-22: 10:00:00 via INTRAVENOUS

## 2017-04-22 MED ORDER — SODIUM CHLORIDE 0.9 % IJ SOLN
10.0000 mL | INTRAMUSCULAR | Status: DC | PRN
  Administered 2017-04-22: 10 mL via INTRAVENOUS
  Filled 2017-04-22: qty 10

## 2017-04-22 MED ORDER — DIPHENHYDRAMINE HCL 12.5 MG/5ML PO ELIX
12.5000 mg | ORAL_SOLUTION | Freq: Once | ORAL | Status: AC
  Administered 2017-04-22: 12.5 mg via ORAL

## 2017-04-22 MED ORDER — SODIUM CHLORIDE 0.9% FLUSH
10.0000 mL | INTRAVENOUS | Status: DC | PRN
  Administered 2017-04-22: 10 mL
  Filled 2017-04-22: qty 10

## 2017-04-22 NOTE — Patient Instructions (Signed)
[facility] Discharge Instructions for Patients Receiving Chemotherapy  Today you received the following chemotherapy agents Herceptin  To help prevent nausea and vomiting after your treatment, we encourage you to take your nausea medication as directed   If you develop nausea and vomiting that is not controlled by your nausea medication, call the clinic.   BELOW ARE SYMPTOMS THAT SHOULD BE REPORTED IMMEDIATELY:  *FEVER GREATER THAN 100.5 F  *CHILLS WITH OR WITHOUT FEVER  NAUSEA AND VOMITING THAT IS NOT CONTROLLED WITH YOUR NAUSEA MEDICATION  *UNUSUAL SHORTNESS OF BREATH  *UNUSUAL BRUISING OR BLEEDING  TENDERNESS IN MOUTH AND THROAT WITH OR WITHOUT PRESENCE OF ULCERS  *URINARY PROBLEMS  *BOWEL PROBLEMS  UNUSUAL RASH Items with * indicate a potential emergency and should be followed up as soon as possible.  Feel free to call the clinic should you have any questions or concerns. The clinic phone number is [phone].  Please show the CHEMO ALERT CARD at check-in to the Emergency Department and triage nurse.   

## 2017-04-23 ENCOUNTER — Encounter: Payer: Self-pay | Admitting: Oncology

## 2017-04-23 NOTE — Progress Notes (Signed)
Received call from patient requesting to schedule an appointment regarding financial assistance. Patient has concerns with bills she received and making payments. Advised patient that payment arrangements must be done through customer service but I reviewed her treatment plan and noticed she is receiving Herceptin which has a copay assistance program she may apply for. I asked her if it would be ok for me to apply online on her behalf. She states that is fine. Asked her some basic questions and advised I would apply online for her and give her a call with the response. She verbalized understanding.  Also, discussed the [employer] with the patient and the qualifications. Based on verbal income for a household of 2, she is over the income guidelines for this one-time $1000 grant. She verbalized understanding. I also advised her if anything else comes available, I will reach out to her and let her know. She was very Patent attorney.  Enrolled patient online in the Herceptin copay program. An approval letter will be mailed to patient as well as one will be faxed here. Will call patient once I receive approval letter.

## 2017-04-24 ENCOUNTER — Ambulatory Visit (HOSPITAL_COMMUNITY)
Admission: RE | Admit: 2017-04-24 | Discharge: 2017-04-24 | Disposition: A | Discharge status: Home / Self Care | Payer: BC Managed Care – PPO | Source: Ambulatory Visit | Attending: Cardiology | Admitting: Cardiology

## 2017-04-24 ENCOUNTER — Encounter (HOSPITAL_COMMUNITY): Payer: Self-pay | Admitting: Cardiology

## 2017-04-24 ENCOUNTER — Ambulatory Visit (HOSPITAL_BASED_OUTPATIENT_CLINIC_OR_DEPARTMENT_OTHER)
Admission: RE | Admit: 2017-04-24 | Discharge: 2017-04-24 | Disposition: A | Discharge status: Home / Self Care | Payer: BC Managed Care – PPO | Source: Ambulatory Visit | Attending: Cardiology | Admitting: Cardiology

## 2017-04-24 VITALS — BP 120/59 | HR 60 | Wt 208.0 lb

## 2017-04-24 DIAGNOSIS — I10 Essential (primary) hypertension: Secondary | ICD-10-CM | POA: Diagnosis not present

## 2017-04-24 DIAGNOSIS — E1140 Type 2 diabetes mellitus with diabetic neuropathy, unspecified: Secondary | ICD-10-CM | POA: Insufficient documentation

## 2017-04-24 DIAGNOSIS — Z886 Allergy status to analgesic agent status: Secondary | ICD-10-CM | POA: Diagnosis not present

## 2017-04-24 DIAGNOSIS — F329 Major depressive disorder, single episode, unspecified: Secondary | ICD-10-CM | POA: Insufficient documentation

## 2017-04-24 DIAGNOSIS — Z79899 Other long term (current) drug therapy: Secondary | ICD-10-CM | POA: Insufficient documentation

## 2017-04-24 DIAGNOSIS — I119 Hypertensive heart disease without heart failure: Secondary | ICD-10-CM | POA: Diagnosis not present

## 2017-04-24 DIAGNOSIS — Z6830 Body mass index (BMI) 30.0-30.9, adult: Secondary | ICD-10-CM | POA: Diagnosis not present

## 2017-04-24 DIAGNOSIS — C50411 Malignant neoplasm of upper-outer quadrant of right female breast: Secondary | ICD-10-CM

## 2017-04-24 DIAGNOSIS — I059 Rheumatic mitral valve disease, unspecified: Secondary | ICD-10-CM | POA: Insufficient documentation

## 2017-04-24 DIAGNOSIS — Z794 Long term (current) use of insulin: Secondary | ICD-10-CM | POA: Insufficient documentation

## 2017-04-24 DIAGNOSIS — Z171 Estrogen receptor negative status [ER-]: Secondary | ICD-10-CM | POA: Diagnosis present

## 2017-04-24 DIAGNOSIS — G4733 Obstructive sleep apnea (adult) (pediatric): Secondary | ICD-10-CM | POA: Diagnosis not present

## 2017-04-24 DIAGNOSIS — Z888 Allergy status to other drugs, medicaments and biological substances status: Secondary | ICD-10-CM | POA: Insufficient documentation

## 2017-04-24 DIAGNOSIS — E785 Hyperlipidemia, unspecified: Secondary | ICD-10-CM | POA: Diagnosis not present

## 2017-04-24 DIAGNOSIS — Z923 Personal history of irradiation: Secondary | ICD-10-CM | POA: Diagnosis not present

## 2017-04-24 DIAGNOSIS — F419 Anxiety disorder, unspecified: Secondary | ICD-10-CM | POA: Diagnosis not present

## 2017-04-24 NOTE — Progress Notes (Signed)
  Echocardiogram 2D Echocardiogram has been performed.  Merrie Roof F 04/24/2017, 10:06 AM

## 2017-04-24 NOTE — Patient Instructions (Signed)
Your physician has requested that you have an echocardiogram. Echocardiography is a painless test that uses sound waves to create images of your heart. It provides your doctor with information about the size and shape of your heart and how well your heart's chambers and valves are working. This procedure takes approximately one hour. There are no restrictions for this procedure.  Your physician recommends that you schedule a follow-up appointment in: 6 months with Dr. Aundra Dubin Please Call an Schedule Appointment

## 2017-04-24 NOTE — Progress Notes (Signed)
Patient ID: Darlene [name], female   DOB: 06/03/1950, 66 y.o.   MRN: [id number] Oncologist: Dr Magrinat   HPI: [name] is a 66 y.o.with history of R breast cancer, right axillary lymph node biopsy 07/19/2014 for a cT2 pN1, stage IIB invasive ductal carcinoma, grade 2, estrogen and progesterone receptor negative, with an MIB-1 of 90%, and HER-2 positive , OSA -> CPAP, bariatric surgery, and DM, referred to cardio-oncology clinic by Dr Magrinat  Completed neoadjuvant chemotherapy with carboplatin, docetaxel, trastuzumab and pertuzumab given every 3 weeks 6. Trastuzumab + capecitabine were completed in 9/17.  She had lumpectomy 12/16 and had mastectomy in 1/17.  She has completed radiation.   She was found in 11/17 to have a recurrence by PET => right retropectoral lymph node enlarged. In 2/18, she started on treatment with ado-trastuzumab emtansine, now back on Herceptin.  Echo prior to treatment showed stable EF.    She returns today for followup screening for Herceptin cardiomyopathy.  Symptomatically doing well, no significant dyspnea or chest pain.  She is now getting Herceptin every 4 wks.   Echo (7/16) with EF 60-65%, grade II diastolic dysfunction.  Echo (10/16) with EF 66%, GLS -17%. Echo (1/17) with EF 60-65%, grade II diastolic dysfunction, lateral s' 10, GLS -18.8% Echo (4/17) with EF 60-65%, grade II diastolic dysfunction, lateral s' 9.5, strain poorly traced and off axis, normal RV size and systolic function.  Echo (6/17) with EF 55-60%, aortic sclerosis without stenosis, difficult images for strain, probably not accurate.  Echo (10/17) with EF 55-60%, mild LVH, moderate diastolic dysfunction, normal RV size and systolic function.  Echo (2/18) with EF 55-60%, GLS -15% but technically difficult study Echo (6/18) with EF 60-65%, GLS -15% but technically difficult study.  Echo (9/18) with EF 55-60%, GLS -19.8%, grade II diastolic dysfunction.  Echo (1/19) with EF 55-60%, normal  RV size and systolic function, GLS -17.2% (done with different machine).  Echo (4/19) with EF 55-60%, mild LVH, GLS -17%  Review of Systems: All systems reviewed and negative except as per HPI.   Past Medical History:  Diagnosis Date  . Anemia    hx of - during 1st round chemo  . Anxiety   . Arthritis    in fingers, shoulders  . Back pain   . Balance problem   . Breast cancer (HCC)   . Breast cancer of upper-outer quadrant of right female breast (HCC) 07/22/2014  . Bunion, right foot   . Clumsiness   . Constipation   . Depression   . Diabetes mellitus without complication (HCC)    20+ years  . Eating disorder    binge eating  . Epistaxis 08/26/2014  . Fatty liver   . HCAP (healthcare-associated pneumonia) 12/18/2014  . Headache   . Heart murmur    never had any problems  . History of radiation therapy 04/05/15-05/19/15   right chest wall was treated to 50.4 Gy in 28 fractions, right mastectomy scar was treated to 10 Gy in 5 fractions  . Hyperlipidemia   . Hypertension   . Joint pain   . Multiple food allergies   . Neuromuscular disorder (HCC)    diabetic neuropathy  . Obesity   . Obstructive sleep apnea on CPAP    uses cpap nightly  . Ovarian cyst rupture    possible  . Pneumonia   . Pneumonia 11/2014  . Radiation 04/05/15-05/19/15   right chest wall 50.4 Gy, mastectomy scar 10 Gy  . Sleep apnea   .   Thyroid nodule 06/2014    Current Outpatient Medications  Medication Sig Dispense Refill  . acetaminophen (TYLENOL) 325 MG tablet Take 650 mg by mouth 2 (two) times daily as needed for moderate pain or headache. Reported on 04/17/2015    . buPROPion (WELLBUTRIN SR) 200 MG 12 hr tablet Take 1 tablet (200 mg total) by mouth daily at 12 noon. 30 tablet 0  . calcium citrate-vitamin D (CITRACAL+D) 315-200 MG-UNIT tablet Take 1 tablet by mouth 3 (three) times daily.    . cholecalciferol (VITAMIN D) 1000 units tablet Take 1,000 Units by mouth daily.    . diphenhydrAMINE (BENADRYL)  25 MG tablet Take 25 mg by mouth daily as needed for allergies or sleep. Reported on 03/06/2015    . glucose blood (ONETOUCH VERIO) test strip 1 each by Other route as needed for other. Reported on 03/06/2015    . hydrochlorothiazide (HYDRODIURIL) 25 MG tablet TAKE 1 TABLET BY MOUTH EVERY DAY 30 tablet 11  . Insulin Glargine (BASAGLAR KWIKPEN) 100 UNIT/ML SOPN Inject 0.16 mLs (16 Units total) into the skin at bedtime. 2 pen 0  . Insulin Pen Needle (BD PEN NEEDLE NANO U/F) 32G X 4 MM MISC 1 Package by Does not apply route daily at 12 noon. 100 each 0  . liraglutide (VICTOZA) 18 MG/3ML SOPN Inject 0.2 mLs (1.2 mg total) into the skin every morning. 2 pen 0  . losartan (COZAAR) 100 MG tablet Take 1 tablet by mouth daily. Reported on 03/06/2015    . Melatonin 5 MG TABS Take 5 mg by mouth at bedtime. Reported on 03/06/2015    . metFORMIN (GLUCOPHAGE) 500 MG tablet Take 1 tablet (500 mg total) by mouth daily. 30 tablet 0  . mometasone (NASONEX) 50 MCG/ACT nasal spray Place 1-2 sprays into the nose daily as needed (for allergies.).     [name] ONETOUCH DELICA LANCETS 44H MISC 1 Package by Does not apply route daily. 100 each 0  . Pediatric Multivitamins-Iron (FLINTSTONES PLUS IRON) chewable tablet Chew 1 tablet by mouth 2 (two) times daily.    . Probiotic Product ([employer] PROBIOTIC PO) Take 1 capsule by mouth daily. Reported on 03/06/2015    . Propylene Glycol (SYSTANE BALANCE OP) Apply 1-2 drops to eye 3 (three) times daily.    [name] senna-docusate (SENNA PLUS) 8.6-50 MG tablet Take 2 tablets by mouth at bedtime.    . topiramate (TOPAMAX) 25 MG tablet Take 1 tablet (25 mg total) by mouth daily. 30 tablet 0  . vortioxetine HBr (TRINTELLIX) 20 MG TABS Take 20 mg by mouth at bedtime. 30 tablet 0   No current facility-administered medications for this encounter.     Allergies  Allergen Reactions  . Nsaids Other (See Comments)    H/o gastric bypass - avoid NSAIDs  . Tape Hives and Rash    Adhesives in bandaids       Social History   Socioeconomic History  . Marital status: Married    Spouse name: [name]  . Number of children: 0  . Years of education: Bachelor's  . Highest education level: Not on file  Occupational History  . Occupation: Pensions consultant: [employer]  . Financial resource strain: Not on file  . Food insecurity:    Worry: Not on file    Inability: Not on file  . Transportation needs:    Medical: Not on file    Non-medical: Not on file  Tobacco Use  . Smoking  status: Never Smoker  . Smokeless tobacco: Never Used  Substance and Sexual Activity  . Alcohol use: Yes    Alcohol/week: 0.0 oz    Comment: social  . Drug use: No  . Sexual activity: Yes    Birth control/protection: Post-menopausal  Lifestyle  . Physical activity:    Days per week: Not on file    Minutes per session: Not on file  . Stress: Not on file  Relationships  . Social connections:    Talks on phone: Not on file    Gets together: Not on file    Attends religious service: Not on file    Active member of club or organization: Not on file    Attends meetings of clubs or organizations: Not on file    Relationship status: Not on file  . Intimate partner violence:    Fear of current or ex partner: Not on file    Emotionally abused: Not on file    Physically abused: Not on file    Forced sexual activity: Not on file  Other Topics Concern  . Not on file  Social History Narrative   Patient is married ([name]) and lives at home with her husband.   Patient does not have any children.   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   Patient has a Bachelor's degree.   Patient is right-handed.   Patient does not drink any caffeine.      Family History  Problem Relation Age of Onset  . CVA Mother   . Heart disease Mother   . Sudden death Mother   . Diabetes Father   . Heart failure Father   . Hypertension Father   . Kidney disease Father   . Obesity Father   .  Hypertension Sister   . Diabetes Brother   . Breast cancer Paternal Grandmother   . Diabetes Paternal Uncle     Vitals:   04/24/17 1035  BP: (!) 120/59  Pulse: 60  SpO2: 100%  Weight: 208 lb (94.3 kg)    PHYSICAL EXAM: General: NAD Neck: No JVD, no thyromegaly or thyroid nodule.  Lungs: Clear to auscultation bilaterally with normal respiratory effort. CV: Nondisplaced PMI.  Heart regular S1/S2, no S3/S4, no murmur.  No peripheral edema.  No carotid bruit.  Normal pedal pulses.  Abdomen: Soft, nontender, no hepatosplenomegaly, no distention.  Skin: Intact without lesions or rashes.  Neurologic: Alert and oriented x 3.  Psych: Normal affect. Extremities: No clubbing or cyanosis.  HEENT: Normal.   ASSESSMENT & PLAN:  1. Breast cancer: She has had recurrence and got ado-trastuzumab emtansine, now back on Herceptin, getting every 4 wks and to be continued long-term.  I reviewed today's echo: EF and strain stable compared to last echo.  - Will move out echo screening a bit, will bring her back for repeat echo and office visit in 6 months.   2. HTN: BP controlled on losartan and HCTZ.  3. OSA: Continue nightly CPAP   Dalton McLean 04/24/2017    

## 2017-04-30 ENCOUNTER — Other Ambulatory Visit (INDEPENDENT_AMBULATORY_CARE_PROVIDER_SITE_OTHER): Payer: Self-pay | Admitting: Family Medicine

## 2017-04-30 DIAGNOSIS — Z794 Long term (current) use of insulin: Secondary | ICD-10-CM

## 2017-04-30 DIAGNOSIS — E119 Type 2 diabetes mellitus without complications: Secondary | ICD-10-CM

## 2017-05-05 ENCOUNTER — Ambulatory Visit (INDEPENDENT_AMBULATORY_CARE_PROVIDER_SITE_OTHER): Payer: BC Managed Care – PPO | Admitting: Family Medicine

## 2017-05-05 VITALS — BP 102/64 | HR 64 | Temp 98.1°F | Ht 69.0 in | Wt 206.0 lb

## 2017-05-05 DIAGNOSIS — Z794 Long term (current) use of insulin: Secondary | ICD-10-CM

## 2017-05-05 DIAGNOSIS — E669 Obesity, unspecified: Secondary | ICD-10-CM

## 2017-05-05 DIAGNOSIS — E119 Type 2 diabetes mellitus without complications: Secondary | ICD-10-CM | POA: Diagnosis not present

## 2017-05-05 DIAGNOSIS — F3289 Other specified depressive episodes: Secondary | ICD-10-CM | POA: Diagnosis not present

## 2017-05-05 DIAGNOSIS — Z6830 Body mass index (BMI) 30.0-30.9, adult: Secondary | ICD-10-CM | POA: Diagnosis not present

## 2017-05-05 MED ORDER — TOPIRAMATE 25 MG PO TABS: 25.0000 mg | ORAL_TABLET | Freq: Every day | ORAL | 0 refills | Status: DC

## 2017-05-05 MED ORDER — LIRAGLUTIDE 18 MG/3ML ~~LOC~~ SOPN: 1.8000 mg | PEN_INJECTOR | SUBCUTANEOUS | 0 refills | Status: DC

## 2017-05-06 NOTE — Progress Notes (Signed)
Office: [phone]  /  Fax: [phone]   HPI:   Chief Complaint: OBESITY Darlene [name] is here to discuss her progress with her obesity treatment plan. She is on the keep a food journal with 1300-1500 calories and 80+ grams of protein daily and is following her eating plan approximately 50 % of the time. She states she is exercising 0 minutes 0 times per week. Darlene [name] is struggling to journal regularly, mostly beng mindful, portion control and making smarter food choices. She still lost 1 lb though. She still struggles with polyphagia, she feels Topamax helps with her emotional eating.  Her weight is 206 lb (93.4 kg) today and has had a weight loss of 1 pound over a period of 2 weeks since her last visit. She has lost 19 lbs since starting treatment with [name].  Diabetes II Darlene [name] has a diagnosis of diabetes type II. Darlene [name] states fasting BGs range between 84 and 160, mostly approximately 100, and she denies any hypoglycemia but still has polyphagia. Last A1c was 6.1 on 03/11/17. She has been working on intensive lifestyle modifications including diet, exercise, and weight loss to help control her blood glucose levels.  Depression with emotional eating behaviors Darlene [name]'s mood has decreased as she only has 2 weeks of unemployment left (her work closed) and she will no longer have insurance. She doesn't qualify for Medicare until the Fall. She is on Herceptin breast cancer recurrence. Darlene [name] struggles with emotional eating and using food for comfort to the extent that it is negatively impacting her health. She often snacks when she is not hungry. Darlene [name] sometimes feels she is out of control and then feels guilty that she made poor food choices. She has been working on behavior modification techniques to help reduce her emotional eating and has been somewhat successful. She shows no sign of suicidal or homicidal ideations.  Depression screen [facility] 2/9 09/12/2016 08/09/2016 01/01/2016 11/15/2015  10/12/2015  Decreased Interest 0 1 0 0 0  Down, Depressed, Hopeless 0 1 0 0 0  PHQ - 2 Score 0 2 0 0 0  Altered sleeping - 0 - - -  Tired, decreased energy - 1 - - -  Change in appetite - 2 - - -  Feeling bad or failure about yourself  - 2 - - -  Trouble concentrating - 1 - - -  Moving slowly or fidgety/restless - 1 - - -  Suicidal thoughts - 0 - - -  PHQ-9 Score - 9 - - -  Some recent data might be hidden    ALLERGIES: Allergies  Allergen Reactions  . Nsaids Other (See Comments)    H/o gastric bypass - avoid NSAIDs  . Tape Hives and Rash    Adhesives in bandaids    MEDICATIONS: Current Outpatient Medications on File Prior to Visit  Medication Sig Dispense Refill  . acetaminophen (TYLENOL) 325 MG tablet Take 650 mg by mouth 2 (two) times daily as needed for moderate pain or headache. Reported on 04/17/2015    . buPROPion (WELLBUTRIN SR) 200 MG 12 hr tablet Take 1 tablet (200 mg total) by mouth daily at 12 noon. 30 tablet 0  . calcium citrate-vitamin D (CITRACAL+D) 315-200 MG-UNIT tablet Take 1 tablet by mouth 3 (three) times daily.    . cholecalciferol (VITAMIN D) 1000 units tablet Take 1,000 Units by mouth daily.    . diphenhydrAMINE (BENADRYL) 25 MG tablet Take 25 mg by mouth daily as needed for allergies or sleep. Reported on 03/06/2015    .  glucose blood (ONETOUCH VERIO) test strip 1 each by Other route as needed for other. Reported on 03/06/2015    . hydrochlorothiazide (HYDRODIURIL) 25 MG tablet TAKE 1 TABLET BY MOUTH EVERY DAY 30 tablet 11  . Insulin Glargine (BASAGLAR KWIKPEN) 100 UNIT/ML SOPN Inject 0.16 mLs (16 Units total) into the skin at bedtime. 2 pen 0  . Insulin Pen Needle (BD PEN NEEDLE NANO U/F) 32G X 4 MM MISC 1 Package by Does not apply route daily at 12 noon. 100 each 0  . losartan (COZAAR) 100 MG tablet Take 1 tablet by mouth daily. Reported on 03/06/2015    . Melatonin 5 MG TABS Take 5 mg by mouth at bedtime. Reported on 03/06/2015    . metFORMIN (GLUCOPHAGE)  500 MG tablet Take 1 tablet (500 mg total) by mouth daily. 30 tablet 0  . mometasone (NASONEX) 50 MCG/ACT nasal spray Place 1-2 sprays into the nose daily as needed (for allergies.).     [name] ONETOUCH DELICA LANCETS 32G MISC 1 Package by Does not apply route daily. 100 each 0  . Pediatric Multivitamins-Iron (FLINTSTONES PLUS IRON) chewable tablet Chew 1 tablet by mouth 2 (two) times daily.    . Probiotic Product ([employer] PROBIOTIC PO) Take 1 capsule by mouth daily. Reported on 03/06/2015    . Propylene Glycol (SYSTANE BALANCE OP) Apply 1-2 drops to eye 3 (three) times daily.    [name] senna-docusate (SENNA PLUS) 8.6-50 MG tablet Take 2 tablets by mouth at bedtime.    . vortioxetine HBr (TRINTELLIX) 20 MG TABS Take 20 mg by mouth at bedtime. 30 tablet 0   No current facility-administered medications on file prior to visit.     PAST MEDICAL HISTORY: Past Medical History:  Diagnosis Date  . Anemia    hx of - during 1st round chemo  . Anxiety   . Arthritis    in fingers, shoulders  . Back pain   . Balance problem   . Breast cancer ([place])   . Breast cancer of upper-outer quadrant of right female breast ([place]) 07/22/2014  . Bunion, right foot   . Clumsiness   . Constipation   . Depression   . Diabetes mellitus without complication ([place])    40+ years  . Eating disorder    binge eating  . Epistaxis 08/26/2014  . Fatty liver   . HCAP (healthcare-associated pneumonia) 12/18/2014  . Headache   . Heart murmur    never had any problems  . History of radiation therapy 04/05/15-05/19/15   right chest wall was treated to 50.4 Gy in 28 fractions, right mastectomy scar was treated to 10 Gy in 5 fractions  . Hyperlipidemia   . Hypertension   . Joint pain   . Multiple food allergies   . Neuromuscular disorder ([place])    diabetic neuropathy  . Obesity   . Obstructive sleep apnea on CPAP    uses cpap nightly  . Ovarian cyst rupture    possible  . Pneumonia   . Pneumonia 11/2014  . Radiation 04/05/15-05/19/15     right chest wall 50.4 Gy, mastectomy scar 10 Gy  . Sleep apnea   . Thyroid nodule 06/2014    PAST SURGICAL HISTORY: Past Surgical History:  Procedure Laterality Date  . APPENDECTOMY    . BIOPSY THYROID Bilateral 06/2014   2 nodules - Benign   . BREAST LUMPECTOMY WITH RADIOACTIVE SEED AND SENTINEL LYMPH NODE BIOPSY Right 12/27/2014   Procedure: BREAST LUMPECTOMY WITH RADIOACTIVE SEED AND SENTINEL  LYMPH NODE BIOPSY;  Surgeon: Stark Klein, MD;  Location: [place];  Service: General;  Laterality: Right;  . BREAST REDUCTION SURGERY Bilateral 01/06/2015   Procedure: BILATERAL BREAST REDUCTION, RIGHT ONCOPLASTIC RECONSTRUCTION),LEFT BREAST REDUCTION FOR SYMMETRY;  Surgeon: [name], MD;  Location: [place];  Service: Plastics;  Laterality: Bilateral;  . BREATH TEK H PYLORI N/A 09/15/2012   Procedure: BREATH TEK H PYLORI;  Surgeon: Pedro Earls, MD;  Location: [facility];  Service: General;  Laterality: N/A;  . BUNIONECTOMY Left 12/2013  . CARPAL TUNNEL RELEASE Right   . CARPAL TUNNEL RELEASE Left   . COLONOSCOPY    . DUPUYTREN CONTRACTURE RELEASE    . GASTRIC ROUX-EN-Y N/A 11/02/2012   Procedure: LAPAROSCOPIC ROUX-EN-Y GASTRIC;  Surgeon: Pedro Earls, MD;  Location: [facility];  Service: General;  Laterality: N/A;  . IR GENERIC HISTORICAL  03/18/2016   IR US GUIDE VASC ACCESS LEFT 03/18/2016 Markus Daft, MD [facility]  . IR GENERIC HISTORICAL  03/18/2016   IR FLUORO GUIDE CV LINE LEFT 03/18/2016 Markus Daft, MD [facility]  . LAPAROSCOPIC APPENDECTOMY N/A 07/22/2015   Procedure: APPENDECTOMY LAPAROSCOPIC;  Surgeon: Michael Boston, MD;  Location: [facility];  Service: General;  Laterality: N/A;  . MASTECTOMY Right 02/15/2015   modified  . MODIFIED MASTECTOMY Right 02/15/2015   Procedure: RIGHT MODIFIED RADICAL MASTECTOMY;  Surgeon: Stark Klein, MD;  Location: [place];  Service: General;  Laterality: Right;  . PORT-A-CATH REMOVAL Left 10/10/2015   Procedure: PORT REMOVAL;   Surgeon: Stark Klein, MD;  Location: [place];  Service: General;  Laterality: Left;  . PORTACATH PLACEMENT Left 08/02/2014   Procedure: INSERTION PORT-A-CATH;  Surgeon: Stark Klein, MD;  Location: [place];  Service: General;  Laterality: Left;  . PORTACATH PLACEMENT N/A 11/05/2016   Procedure: INSERTION PORT-A-CATH;  Surgeon: Stark Klein, MD;  Location: [place];  Service: General;  Laterality: N/A;  . ROTATOR CUFF REPAIR Right 2005  . SCAR REVISION Right 12/08/2015   Procedure: COMPLEX REPAIR RIGHT CHEST 10-15CM;  Surgeon: [name], MD;  Location: [place];  Service: Plastics;  Laterality: Right;  . TOE SURGERY  1970s   bone spur  . TRIGGER FINGER RELEASE     x3    SOCIAL HISTORY: Social History   Tobacco Use  . Smoking status: Never Smoker  . Smokeless tobacco: Never Used  Substance Use Topics  . Alcohol use: Yes    Alcohol/week: 0.0 oz    Comment: social  . Drug use: No    FAMILY HISTORY: Family History  Problem Relation Age of Onset  . CVA Mother   . Heart disease Mother   . Sudden death Mother   . Diabetes Father   . Heart failure Father   . Hypertension Father   . Kidney disease Father   . Obesity Father   . Hypertension Sister   . Diabetes Brother   . Breast cancer Paternal Grandmother   . Diabetes Paternal Uncle     ROS: Review of Systems  Constitutional: Positive for weight loss.  Endo/Heme/Allergies:       Negative hypoglycemia Positive polyphagia  Psychiatric/Behavioral: Positive for depression. Negative for suicidal ideas.    PHYSICAL EXAM: Blood pressure 102/64, pulse 64, temperature 98.1 F (36.7 C), temperature source Oral, height 5\' 9"  (1.753 m), weight 206 lb (93.4 kg), last menstrual period 10/22/2007, SpO2 100 %. Body mass index is 30.42 kg/m. Physical Exam  Constitutional: She is oriented to person,  place, and time. She appears well-developed and well-nourished.  Cardiovascular: Normal rate.    Pulmonary/Chest: Effort normal.  Musculoskeletal: Normal range of motion.  Neurological: She is oriented to person, place, and time.  Skin: Skin is warm and dry.  Psychiatric: She has a normal mood and affect. Her behavior is normal.  Vitals reviewed.   RECENT LABS AND TESTS: BMET    Component Value Date/Time   NA 139 04/22/2017 0848   NA 143 03/11/2017 0841   NA 140 01/07/2017 0758   K 4.0 04/22/2017 0848   K 4.1 01/07/2017 0758   CL 109 04/22/2017 0848   CO2 21 (L) 04/22/2017 0848   CO2 24 01/07/2017 0758   GLUCOSE 174 (H) 04/22/2017 0848   GLUCOSE 134 01/07/2017 0758   BUN 61 (H) 04/22/2017 0848   BUN 40 (H) 03/11/2017 0841   BUN 33.6 (H) 01/07/2017 0758   CREATININE 2.21 (H) 04/22/2017 0848   CREATININE 1.5 (H) 01/07/2017 0758   CALCIUM 9.6 04/22/2017 0848   CALCIUM 9.3 01/07/2017 0758   GFRNONAA 22 (L) 04/22/2017 0848   GFRNONAA 55 (L) 02/18/2013 0827   GFRAA 26 (L) 04/22/2017 0848   GFRAA 63 02/18/2013 0827   Lab Results  Component Value Date   HGBA1C 6.1 (H) 03/11/2017   HGBA1C 8.2 (H) 11/05/2016   HGBA1C 8.1 (H) 08/09/2016   HGBA1C 6.5 (H) 01/03/2015   HGBA1C 6.9 (H) 02/18/2013   Lab Results  Component Value Date   INSULIN 15.3 03/11/2017   INSULIN 18.3 08/09/2016   CBC    Component Value Date/Time   WBC 4.8 04/22/2017 0848   WBC 6.1 02/25/2017 0845   RBC 3.79 04/22/2017 0848   HGB 11.1 (L) 02/25/2017 0845   HGB 10.8 (L) 01/07/2017 0758   HCT 34.9 04/22/2017 0848   HCT 31.9 (L) 01/07/2017 0758   PLT 158 04/22/2017 0848   PLT 126 (L) 01/07/2017 0758   MCV 92.1 04/22/2017 0848   MCV 90.8 01/07/2017 0758   MCH 31.4 04/22/2017 0848   MCHC 34.1 04/22/2017 0848   RDW 13.7 04/22/2017 0848   RDW 14.8 (H) 01/07/2017 0758   LYMPHSABS 1.3 04/22/2017 0848   LYMPHSABS 1.1 01/07/2017 0758   MONOABS 0.4 04/22/2017 0848   MONOABS 0.3 01/07/2017 0758   EOSABS 0.1 04/22/2017 0848   EOSABS 0.1 01/07/2017 0758   EOSABS 0.1 08/09/2016 1229   BASOSABS 0.0  04/22/2017 0848   BASOSABS 0.0 01/07/2017 0758   Iron/TIBC/Ferritin/ %Sat    Component Value Date/Time   IRON 58 02/18/2013 0827   TIBC 305 02/18/2013 0827   FERRITIN 133 10/31/2014 0816   IRONPCTSAT 19 (L) 02/18/2013 0827   Lipid Panel     Component Value Date/Time   CHOL 164 02/07/2017   CHOL 182 08/09/2016 1229   TRIG 114 02/07/2017   HDL 59 02/07/2017   HDL 62 08/09/2016 1229   CHOLHDL 2.9 02/18/2013 0827   VLDL 23 02/18/2013 0827   LDLCALC 83 02/07/2017   LDLCALC 85 08/09/2016 1229   Hepatic Function Panel     Component Value Date/Time   PROT 7.0 04/22/2017 0848   PROT 6.9 03/11/2017 0841   PROT 6.7 01/07/2017 0758   ALBUMIN 4.0 04/22/2017 0848   ALBUMIN 4.5 03/11/2017 0841   ALBUMIN 3.6 01/07/2017 0758   AST 30 04/22/2017 0848   AST 51 (H) 01/07/2017 0758   ALT 35 04/22/2017 0848   ALT 82 (H) 01/07/2017 0758   ALKPHOS 99 04/22/2017 0848   ALKPHOS  115 01/07/2017 0758   BILITOT 0.7 04/22/2017 0848   BILITOT 0.72 01/07/2017 0758      Component Value Date/Time   TSH 1.54 02/07/2017   TSH 2.170 08/09/2016 1229    ASSESSMENT AND PLAN: Type 2 diabetes mellitus without complication, with long-term current use of insulin (HCC) - Plan: liraglutide (VICTOZA) 18 MG/3ML SOPN  Other depression - with emotional eating - Plan: topiramate (TOPAMAX) 25 MG tablet  Class 1 obesity with serious comorbidity and body mass index (BMI) of 30.0 to 30.9 in adult, unspecified obesity type  Other depression - Plan: topiramate (TOPAMAX) 25 MG tablet  PLAN:  Diabetes II Darlene [name] has been given extensive diabetes education by myself today including ideal fasting and post-prandial blood glucose readings, individual ideal Hgb A1c goals and hypoglycemia prevention. We discussed the importance of good blood sugar control to decrease the likelihood of diabetic complications such as nephropathy, neuropathy, limb loss, blindness, coronary artery disease, and death. We discussed the  importance of intensive lifestyle modification including diet, exercise and weight loss as the first line treatment for diabetes. Darlene [name] agrees to increase Victoza 1.8 mg #3 packs and we will refill for 1 month. Darlene [name] agrees to follow up with our clinic in 2 weeks.  Depression with Emotional Eating Behaviors We discussed behavior modification techniques today to help Darlene [name] deal with her emotional eating and depression. Darlene [name] is to continue taking her Wellbutrin and Trintellix and she agrees to continue taking Topamax 25 mg qd #30 and we will refill for 1 month. Darlene [name] agrees to follow up with our clinic in 2 weeks.  Obesity Darlene [name] is currently in the action stage of change. As such, her goal is to continue with weight loss efforts She has agreed to keep a food journal with 1300-1500 calories and 80+ grams of protein daily Darlene [name] has been instructed to work up to a goal of 150 minutes of combined cardio and strengthening exercise per week for weight loss and overall health benefits. We discussed the following Behavioral Modification Strategies today: increasing lean protein intake, decreasing simple carbohydrates  and emotional eating strategies   Darlene [name] has agreed to follow up with our clinic in 2 weeks. She was informed of the importance of frequent follow up visits to maximize her success with intensive lifestyle modifications for her multiple health conditions.   OBESITY BEHAVIORAL INTERVENTION VISIT  Today's visit was # 17 out of 22.  Starting weight: 225 lbs Starting date: 08/09/16 Today's weight : 206 lbs Today's date: 05/05/2017 Total lbs lost to date: 8 (Patients must lose 7 lbs in the first 6 months to continue with counseling)   ASK: We discussed the diagnosis of obesity with Darlene [name] today and Darlene [name] agreed to give [name] permission to discuss obesity behavioral modification therapy today.  ASSESS: Darlene [name] has the diagnosis of obesity and her BMI  today is 30.41 Darlene [name] is in the action stage of change   ADVISE: Darlene [name] was educated on the multiple health risks of obesity as well as the benefit of weight loss to improve her health. She was advised of the need for long term treatment and the importance of lifestyle modifications.  AGREE: Multiple dietary modification options and treatment options were discussed and  Darlene [name] agreed to the above obesity treatment plan.  I, [name], am acting as transcriptionist for Dennard Nip, MD  I have reviewed the above documentation for accuracy and completeness, and I agree with the above. -Dennard Nip, MD

## 2017-05-14 ENCOUNTER — Other Ambulatory Visit (INDEPENDENT_AMBULATORY_CARE_PROVIDER_SITE_OTHER): Payer: Self-pay | Admitting: Family Medicine

## 2017-05-14 DIAGNOSIS — F3289 Other specified depressive episodes: Secondary | ICD-10-CM

## 2017-05-14 DIAGNOSIS — E119 Type 2 diabetes mellitus without complications: Secondary | ICD-10-CM

## 2017-05-14 DIAGNOSIS — Z794 Long term (current) use of insulin: Secondary | ICD-10-CM

## 2017-05-19 ENCOUNTER — Other Ambulatory Visit: Payer: Self-pay | Admitting: *Deleted

## 2017-05-19 DIAGNOSIS — Z171 Estrogen receptor negative status [ER-]: Secondary | ICD-10-CM

## 2017-05-19 DIAGNOSIS — C50411 Malignant neoplasm of upper-outer quadrant of right female breast: Secondary | ICD-10-CM

## 2017-05-20 ENCOUNTER — Inpatient Hospital Stay: Payer: BC Managed Care – PPO

## 2017-05-20 VITALS — BP 122/57 | HR 65 | Temp 97.8°F | Resp 16

## 2017-05-20 DIAGNOSIS — C50411 Malignant neoplasm of upper-outer quadrant of right female breast: Secondary | ICD-10-CM

## 2017-05-20 DIAGNOSIS — Z95828 Presence of other vascular implants and grafts: Secondary | ICD-10-CM

## 2017-05-20 DIAGNOSIS — Z5112 Encounter for antineoplastic immunotherapy: Secondary | ICD-10-CM | POA: Diagnosis not present

## 2017-05-20 DIAGNOSIS — Z171 Estrogen receptor negative status [ER-]: Secondary | ICD-10-CM

## 2017-05-20 LAB — CBC WITH DIFFERENTIAL (CANCER CENTER ONLY)
Basophils Absolute: 0 10*3/uL (ref 0.0–0.1)
Basophils Relative: 0 %
Eosinophils Absolute: 0.1 10*3/uL (ref 0.0–0.5)
Eosinophils Relative: 2 %
HCT: 32.5 % — ABNORMAL LOW (ref 34.8–46.6)
Hemoglobin: 10.9 g/dL — ABNORMAL LOW (ref 11.6–15.9)
Lymphocytes Relative: 23 %
Lymphs Abs: 1.1 10*3/uL (ref 0.9–3.3)
MCH: 31.2 pg (ref 25.1–34.0)
MCHC: 33.5 g/dL (ref 31.5–36.0)
MCV: 93.1 fL (ref 79.5–101.0)
Monocytes Absolute: 0.2 10*3/uL (ref 0.1–0.9)
Monocytes Relative: 5 %
Neutro Abs: 3.2 10*3/uL (ref 1.5–6.5)
Neutrophils Relative %: 70 %
Platelet Count: 142 10*3/uL — ABNORMAL LOW (ref 145–400)
RBC: 3.49 MIL/uL — ABNORMAL LOW (ref 3.70–5.45)
RDW: 13.9 % (ref 11.2–14.5)
WBC Count: 4.6 10*3/uL (ref 3.9–10.3)

## 2017-05-20 LAB — CMP (CANCER CENTER ONLY)
ALT: 59 U/L — ABNORMAL HIGH (ref 0–55)
AST: 41 U/L — ABNORMAL HIGH (ref 5–34)
Albumin: 3.7 g/dL (ref 3.5–5.0)
Alkaline Phosphatase: 92 U/L (ref 40–150)
Anion gap: 7 (ref 3–11)
BUN: 34 mg/dL — ABNORMAL HIGH (ref 7–26)
CO2: 23 mmol/L (ref 22–29)
Calcium: 9.4 mg/dL (ref 8.4–10.4)
Chloride: 111 mmol/L — ABNORMAL HIGH (ref 98–109)
Creatinine: 1.62 mg/dL — ABNORMAL HIGH (ref 0.60–1.10)
GFR, Est AFR Am: 38 mL/min — ABNORMAL LOW (ref >60)
GFR, Estimated: 33 mL/min — ABNORMAL LOW (ref >60)
Glucose, Bld: 138 mg/dL (ref 70–140)
Potassium: 3.9 mmol/L (ref 3.5–5.1)
Sodium: 141 mmol/L (ref 136–145)
Total Bilirubin: 0.6 mg/dL (ref 0.2–1.2)
Total Protein: 6.5 g/dL (ref 6.4–8.3)

## 2017-05-20 MED ORDER — ACETAMINOPHEN 325 MG PO TABS
650.0000 mg | ORAL_TABLET | Freq: Once | ORAL | Status: AC
  Administered 2017-05-20: 650 mg via ORAL

## 2017-05-20 MED ORDER — SODIUM CHLORIDE 0.9% FLUSH
10.0000 mL | INTRAVENOUS | Status: DC | PRN
  Administered 2017-05-20: 10 mL
  Filled 2017-05-20: qty 10

## 2017-05-20 MED ORDER — SODIUM CHLORIDE 0.9 % IJ SOLN
10.0000 mL | INTRAMUSCULAR | Status: DC | PRN
Start: 2017-05-20 — End: 2017-05-20
  Administered 2017-05-20: 10 mL via INTRAVENOUS
  Filled 2017-05-20: qty 10

## 2017-05-20 MED ORDER — SODIUM CHLORIDE 0.9 % IV SOLN
Freq: Once | INTRAVENOUS | Status: AC
  Administered 2017-05-20: 09:00:00 via INTRAVENOUS

## 2017-05-20 MED ORDER — TRASTUZUMAB CHEMO 150 MG IV SOLR
600.0000 mg | Freq: Once | INTRAVENOUS | Status: AC
  Administered 2017-05-20: 600 mg via INTRAVENOUS
  Filled 2017-05-20: qty 28.57

## 2017-05-20 MED ORDER — HEPARIN SOD (PORK) LOCK FLUSH 100 UNIT/ML IV SOLN
500.0000 [IU] | Freq: Once | INTRAVENOUS | Status: AC | PRN
  Administered 2017-05-20: 500 [IU]
  Filled 2017-05-20: qty 5

## 2017-05-20 MED ORDER — DIPHENHYDRAMINE HCL 12.5 MG/5ML PO ELIX
ORAL_SOLUTION | ORAL | Status: AC
  Filled 2017-05-20: qty 5

## 2017-05-20 MED ORDER — ACETAMINOPHEN 325 MG PO TABS
ORAL_TABLET | ORAL | Status: AC
  Filled 2017-05-20: qty 2

## 2017-05-20 MED ORDER — DIPHENHYDRAMINE HCL 12.5 MG/5ML PO ELIX
12.5000 mg | ORAL_SOLUTION | Freq: Once | ORAL | Status: AC
  Administered 2017-05-20: 12.5 mg via ORAL

## 2017-05-20 NOTE — Patient Instructions (Signed)
[facility] Discharge Instructions for Patients Receiving Chemotherapy  Today you received the following chemotherapy agents Herceptin  To help prevent nausea and vomiting after your treatment, we encourage you to take your nausea medication as directed   If you develop nausea and vomiting that is not controlled by your nausea medication, call the clinic.   BELOW ARE SYMPTOMS THAT SHOULD BE REPORTED IMMEDIATELY:  *FEVER GREATER THAN 100.5 F  *CHILLS WITH OR WITHOUT FEVER  NAUSEA AND VOMITING THAT IS NOT CONTROLLED WITH YOUR NAUSEA MEDICATION  *UNUSUAL SHORTNESS OF BREATH  *UNUSUAL BRUISING OR BLEEDING  TENDERNESS IN MOUTH AND THROAT WITH OR WITHOUT PRESENCE OF ULCERS  *URINARY PROBLEMS  *BOWEL PROBLEMS  UNUSUAL RASH Items with * indicate a potential emergency and should be followed up as soon as possible.  Feel free to call the clinic should you have any questions or concerns. The clinic phone number is [phone].  Please show the CHEMO ALERT CARD at check-in to the Emergency Department and triage nurse.   

## 2017-05-21 ENCOUNTER — Ambulatory Visit (INDEPENDENT_AMBULATORY_CARE_PROVIDER_SITE_OTHER): Payer: BC Managed Care – PPO | Admitting: Family Medicine

## 2017-05-21 VITALS — BP 109/70 | HR 70 | Temp 98.2°F | Ht 69.0 in | Wt 207.0 lb

## 2017-05-21 DIAGNOSIS — E669 Obesity, unspecified: Secondary | ICD-10-CM | POA: Diagnosis not present

## 2017-05-21 DIAGNOSIS — Z794 Long term (current) use of insulin: Secondary | ICD-10-CM

## 2017-05-21 DIAGNOSIS — E119 Type 2 diabetes mellitus without complications: Secondary | ICD-10-CM | POA: Diagnosis not present

## 2017-05-21 DIAGNOSIS — Z6830 Body mass index (BMI) 30.0-30.9, adult: Secondary | ICD-10-CM | POA: Diagnosis not present

## 2017-05-21 MED ORDER — METFORMIN HCL 500 MG PO TABS: 500.0000 mg | ORAL_TABLET | Freq: Every day | ORAL | 0 refills | Status: DC

## 2017-05-21 NOTE — Progress Notes (Signed)
Office: [phone]  /  Fax: [phone]   HPI:   Chief Complaint: OBESITY Darlene [name] is here to discuss her progress with her obesity treatment plan. She is on the keep a food journal with 1300-1500 calories and 80+ grams of protein daily and is following her eating plan approximately 60 % of the time. She states she is doing exercise class for 60 minutes 1 times per week. Philip was on vacation and had some increase celebration eating. She is starting a new exercise program. She is ready to get back on track.  Her weight is 207 lb (93.9 kg) today and has gained 1 pound since her last visit. She has lost 18 lbs since starting treatment with [name].  Diabetes II [name] has a diagnosis of diabetes type II. [name] states fasting BGs range between 91 and 120. She increased Victoza to 1.8, she denies nausea, vomiting, hypoglycemia. Last A1c was 6.1. She is on metformin. She has been working on intensive lifestyle modifications including diet, exercise, and weight loss to help control her blood glucose levels.  ALLERGIES: Allergies  Allergen Reactions  . Nsaids Other (See Comments)    H/o gastric bypass - avoid NSAIDs  . Tape Hives and Rash    Adhesives in bandaids    MEDICATIONS: Current Outpatient Medications on File Prior to Visit  Medication Sig Dispense Refill  . acetaminophen (TYLENOL) 325 MG tablet Take 650 mg by mouth 2 (two) times daily as needed for moderate pain or headache. Reported on 04/17/2015    . buPROPion (WELLBUTRIN SR) 200 MG 12 hr tablet Take 1 tablet (200 mg total) by mouth daily at 12 noon. 30 tablet 0  . calcium citrate-vitamin D (CITRACAL+D) 315-200 MG-UNIT tablet Take 1 tablet by mouth 3 (three) times daily.    . cholecalciferol (VITAMIN D) 1000 units tablet Take 1,000 Units by mouth daily.    . diphenhydrAMINE (BENADRYL) 25 MG tablet Take 25 mg by mouth daily as needed for allergies or sleep. Reported on 03/06/2015    . glucose blood (ONETOUCH VERIO) test strip  1 each by Other route as needed for other. Reported on 03/06/2015    . hydrochlorothiazide (HYDRODIURIL) 25 MG tablet TAKE 1 TABLET BY MOUTH EVERY DAY 30 tablet 11  . Insulin Glargine (BASAGLAR KWIKPEN) 100 UNIT/ML SOPN Inject 0.16 mLs (16 Units total) into the skin at bedtime. 2 pen 0  . Insulin Pen Needle (BD PEN NEEDLE NANO U/F) 32G X 4 MM MISC 1 Package by Does not apply route daily at 12 noon. 100 each 0  . liraglutide (VICTOZA) 18 MG/3ML SOPN Inject 0.3 mLs (1.8 mg total) into the skin every morning. 3 pen 0  . losartan (COZAAR) 100 MG tablet Take 1 tablet by mouth daily. Reported on 03/06/2015    . Melatonin 5 MG TABS Take 5 mg by mouth at bedtime. Reported on 03/06/2015    . mometasone (NASONEX) 50 MCG/ACT nasal spray Place 1-2 sprays into the nose daily as needed (for allergies.).     [name] ONETOUCH DELICA LANCETS 43X MISC 1 Package by Does not apply route daily. 100 each 0  . Pediatric Multivitamins-Iron (FLINTSTONES PLUS IRON) chewable tablet Chew 1 tablet by mouth 2 (two) times daily.    . Probiotic Product ([employer] PROBIOTIC PO) Take 1 capsule by mouth daily. Reported on 03/06/2015    . Propylene Glycol (SYSTANE BALANCE OP) Apply 1-2 drops to eye 3 (three) times daily.    [name] senna-docusate (SENNA PLUS) 8.6-50 MG tablet Take  2 tablets by mouth at bedtime.    . topiramate (TOPAMAX) 25 MG tablet Take 1 tablet (25 mg total) by mouth daily. 30 tablet 0  . vortioxetine HBr (TRINTELLIX) 20 MG TABS Take 20 mg by mouth at bedtime. 30 tablet 0   No current facility-administered medications on file prior to visit.     PAST MEDICAL HISTORY: Past Medical History:  Diagnosis Date  . Anemia    hx of - during 1st round chemo  . Anxiety   . Arthritis    in fingers, shoulders  . Back pain   . Balance problem   . Breast cancer ([place])   . Breast cancer of upper-outer quadrant of right female breast ([place]) 07/22/2014  . Bunion, right foot   . Clumsiness   . Constipation   . Depression   . Diabetes mellitus  without complication ([place])    42+ years  . Eating disorder    binge eating  . Epistaxis 08/26/2014  . Fatty liver   . HCAP (healthcare-associated pneumonia) 12/18/2014  . Headache   . Heart murmur    never had any problems  . History of radiation therapy 04/05/15-05/19/15   right chest wall was treated to 50.4 Gy in 28 fractions, right mastectomy scar was treated to 10 Gy in 5 fractions  . Hyperlipidemia   . Hypertension   . Joint pain   . Multiple food allergies   . Neuromuscular disorder ([place])    diabetic neuropathy  . Obesity   . Obstructive sleep apnea on CPAP    uses cpap nightly  . Ovarian cyst rupture    possible  . Pneumonia   . Pneumonia 11/2014  . Radiation 04/05/15-05/19/15   right chest wall 50.4 Gy, mastectomy scar 10 Gy  . Sleep apnea   . Thyroid nodule 06/2014    PAST SURGICAL HISTORY: Past Surgical History:  Procedure Laterality Date  . APPENDECTOMY    . BIOPSY THYROID Bilateral 06/2014   2 nodules - Benign   . BREAST LUMPECTOMY WITH RADIOACTIVE SEED AND SENTINEL LYMPH NODE BIOPSY Right 12/27/2014   Procedure: BREAST LUMPECTOMY WITH RADIOACTIVE SEED AND SENTINEL LYMPH NODE BIOPSY;  Surgeon: Stark Klein, MD;  Location: [facility];  Service: General;  Laterality: Right;  . BREAST REDUCTION SURGERY Bilateral 01/06/2015   Procedure: BILATERAL BREAST REDUCTION, RIGHT ONCOPLASTIC RECONSTRUCTION),LEFT BREAST REDUCTION FOR SYMMETRY;  Surgeon: [name], MD;  Location: [facility];  Service: Plastics;  Laterality: Bilateral;  . BREATH TEK H PYLORI N/A 09/15/2012   Procedure: BREATH TEK H PYLORI;  Surgeon: Pedro Earls, MD;  Location: [facility];  Service: General;  Laterality: N/A;  . BUNIONECTOMY Left 12/2013  . CARPAL TUNNEL RELEASE Right   . CARPAL TUNNEL RELEASE Left   . COLONOSCOPY    . DUPUYTREN CONTRACTURE RELEASE    . GASTRIC ROUX-EN-Y N/A 11/02/2012   Procedure: LAPAROSCOPIC ROUX-EN-Y GASTRIC;  Surgeon: Pedro Earls, MD;  Location: [facility];  Service: General;  Laterality: N/A;  . IR GENERIC HISTORICAL  03/18/2016   IR US GUIDE VASC ACCESS LEFT 03/18/2016 Markus Daft, MD [facility]  . IR GENERIC HISTORICAL  03/18/2016   IR FLUORO GUIDE CV LINE LEFT 03/18/2016 Markus Daft, MD [facility]  . LAPAROSCOPIC APPENDECTOMY N/A 07/22/2015   Procedure: APPENDECTOMY LAPAROSCOPIC;  Surgeon: Michael Boston, MD;  Location[facility];  Service: General;  Laterality: N/A;  . MASTECTOMY Right 02/15/2015   modified  . MODIFIED MASTECTOMY Right 02/15/2015   Procedure: RIGHT  MODIFIED RADICAL MASTECTOMY;  Surgeon: Stark Klein, MD;  Location: [facility];  Service: General;  Laterality: Right;  . PORT-A-CATH REMOVAL Left 10/10/2015   Procedure: PORT REMOVAL;  Surgeon: Stark Klein, MD;  Location: [facility];  Service: General;  Laterality: Left;  . PORTACATH PLACEMENT Left 08/02/2014   Procedure: INSERTION PORT-A-CATH;  Surgeon: Stark Klein, MD;  Location: [facility];  Service: General;  Laterality: Left;  . PORTACATH PLACEMENT N/A 11/05/2016   Procedure: INSERTION PORT-A-CATH;  Surgeon: Stark Klein, MD;  Location: [facility];  Service: General;  Laterality: N/A;  . ROTATOR CUFF REPAIR Right 2005  . SCAR REVISION Right 12/08/2015   Procedure: COMPLEX REPAIR RIGHT CHEST 10-15CM;  Surgeon: [name], MD;  Location: [facility];  Service: Plastics;  Laterality: Right;  . TOE SURGERY  1970s   bone spur  . TRIGGER FINGER RELEASE     x3    SOCIAL HISTORY: Social History   Tobacco Use  . Smoking status: Never Smoker  . Smokeless tobacco: Never Used  Substance Use Topics  . Alcohol use: Yes    Alcohol/week: 0.0 oz    Comment: social  . Drug use: No    FAMILY HISTORY: Family History  Problem Relation Age of Onset  . CVA Mother   . Heart disease Mother   . Sudden death Mother   . Diabetes Father   . Heart failure Father   . Hypertension Father   . Kidney disease Father   . Obesity Father   . Hypertension Sister   .  Diabetes Brother   . Breast cancer Paternal Grandmother   . Diabetes Paternal Uncle     ROS: Review of Systems  Constitutional: Negative for weight loss.  Gastrointestinal: Negative for nausea and vomiting.  Endo/Heme/Allergies:       Negative hypoglycemia    PHYSICAL EXAM: Blood pressure 109/70, pulse 70, temperature 98.2 F (36.8 C), temperature source Oral, height 5\' 9"  (1.753 m), weight 207 lb (93.9 kg), last menstrual period 10/22/2007, SpO2 97 %. Body mass index is 30.57 kg/m. Physical Exam  Constitutional: She is oriented to person, place, and time. She appears well-developed and well-nourished.  Cardiovascular: Normal rate.  Pulmonary/Chest: Effort normal.  Musculoskeletal: Normal range of motion.  Neurological: She is oriented to person, place, and time.  Skin: Skin is warm and dry.  Psychiatric: She has a normal mood and affect. Her behavior is normal.  Vitals reviewed.   RECENT LABS AND TESTS: BMET    Component Value Date/Time   NA 141 05/20/2017 0813   NA 143 03/11/2017 0841   NA 140 01/07/2017 0758   K 3.9 05/20/2017 0813   K 4.1 01/07/2017 0758   CL 111 (H) 05/20/2017 0813   CO2 23 05/20/2017 0813   CO2 24 01/07/2017 0758   GLUCOSE 138 05/20/2017 0813   GLUCOSE 134 01/07/2017 0758   BUN 34 (H) 05/20/2017 0813   BUN 40 (H) 03/11/2017 0841   BUN 33.6 (H) 01/07/2017 0758   CREATININE 1.62 (H) 05/20/2017 0813   CREATININE 1.5 (H) 01/07/2017 0758   CALCIUM 9.4 05/20/2017 0813   CALCIUM 9.3 01/07/2017 0758   GFRNONAA 33 (L) 05/20/2017 0813   GFRNONAA 55 (L) 02/18/2013 0827   GFRAA 38 (L) 05/20/2017 0813   GFRAA 63 02/18/2013 0827   Lab Results  Component Value Date   HGBA1C 6.1 (H) 03/11/2017   HGBA1C 8.2 (H) 11/05/2016   HGBA1C 8.1 (H) 08/09/2016   HGBA1C 6.5 (H) 01/03/2015  HGBA1C 6.9 (H) 02/18/2013   Lab Results  Component Value Date   INSULIN 15.3 03/11/2017   INSULIN 18.3 08/09/2016   CBC    Component Value Date/Time   WBC 4.6  05/20/2017 0813   WBC 6.1 02/25/2017 0845   RBC 3.49 (L) 05/20/2017 0813   HGB 10.9 (L) 05/20/2017 0813   HGB 10.8 (L) 01/07/2017 0758   HCT 32.5 (L) 05/20/2017 0813   HCT 31.9 (L) 01/07/2017 0758   PLT 142 (L) 05/20/2017 0813   PLT 126 (L) 01/07/2017 0758   MCV 93.1 05/20/2017 0813   MCV 90.8 01/07/2017 0758   MCH 31.2 05/20/2017 0813   MCHC 33.5 05/20/2017 0813   RDW 13.9 05/20/2017 0813   RDW 14.8 (H) 01/07/2017 0758   LYMPHSABS 1.1 05/20/2017 0813   LYMPHSABS 1.1 01/07/2017 0758   MONOABS 0.2 05/20/2017 0813   MONOABS 0.3 01/07/2017 0758   EOSABS 0.1 05/20/2017 0813   EOSABS 0.1 01/07/2017 0758   EOSABS 0.1 08/09/2016 1229   BASOSABS 0.0 05/20/2017 0813   BASOSABS 0.0 01/07/2017 0758   Iron/TIBC/Ferritin/ %Sat    Component Value Date/Time   IRON 58 02/18/2013 0827   TIBC 305 02/18/2013 0827   FERRITIN 133 10/31/2014 0816   IRONPCTSAT 19 (L) 02/18/2013 0827   Lipid Panel     Component Value Date/Time   CHOL 164 02/07/2017   CHOL 182 08/09/2016 1229   TRIG 114 02/07/2017   HDL 59 02/07/2017   HDL 62 08/09/2016 1229   CHOLHDL 2.9 02/18/2013 0827   VLDL 23 02/18/2013 0827   LDLCALC 83 02/07/2017   LDLCALC 85 08/09/2016 1229   Hepatic Function Panel     Component Value Date/Time   PROT 6.5 05/20/2017 0813   PROT 6.9 03/11/2017 0841   PROT 6.7 01/07/2017 0758   ALBUMIN 3.7 05/20/2017 0813   ALBUMIN 4.5 03/11/2017 0841   ALBUMIN 3.6 01/07/2017 0758   AST 41 (H) 05/20/2017 0813   AST 51 (H) 01/07/2017 0758   ALT 59 (H) 05/20/2017 0813   ALT 82 (H) 01/07/2017 0758   ALKPHOS 92 05/20/2017 0813   ALKPHOS 115 01/07/2017 0758   BILITOT 0.6 05/20/2017 0813   BILITOT 0.72 01/07/2017 0758      Component Value Date/Time   TSH 1.54 02/07/2017   TSH 2.170 08/09/2016 1229    ASSESSMENT AND PLAN: Class 1 obesity with serious comorbidity and body mass index (BMI) of 30.0 to 30.9 in adult, unspecified obesity type  Type 2 diabetes mellitus without complication,  with long-term current use of insulin ([place]) - Plan: metFORMIN (GLUCOPHAGE) 500 MG tablet  PLAN:  Diabetes II Sinclair has been given extensive diabetes education by myself today including ideal fasting and post-prandial blood glucose readings, individual ideal Hgb A1c goals and hypoglycemia prevention. We discussed the importance of good blood sugar control to decrease the likelihood of diabetic complications such as nephropathy, neuropathy, limb loss, blindness, coronary artery disease, and death. We discussed the importance of intensive lifestyle modification including diet, exercise and weight loss as the first line treatment for diabetes. Malvika agrees to continue taking metformin 500 mg qd #30 and we will refill for 1 month, and she agrees to continue Victoza. Srah agrees to follow up with our clinic in 3 weeks.  Obesity Kristal is currently in the action stage of change. As such, her goal is to continue with weight loss efforts She has agreed to keep a food journal with 1300-1500 calories and 80+ grams of protein daily  Tyan has been instructed to work up to a goal of 150 minutes of combined cardio and strengthening exercise per week for weight loss and overall health benefits. We discussed the following Behavioral Modification Strategies today: increasing lean protein intake, decreasing simple carbohydrates  and decrease eating out   Roshan has agreed to follow up with our clinic in 3 weeks. She was informed of the importance of frequent follow up visits to maximize her success with intensive lifestyle modifications for her multiple health conditions.   OBESITY BEHAVIORAL INTERVENTION VISIT  Today's visit was # 18 out of 22.  Starting weight: 225 lbs Starting date: 08/09/16 Today's weight : 207 lbs Today's date: 05/21/2017 Total lbs lost to date: 57 (Patients must lose 7 lbs in the first 6 months to continue with counseling)   ASK: We discussed the diagnosis of obesity  with [name] today and Krissia agreed to give [name] permission to discuss obesity behavioral modification therapy today.  ASSESS: Sabree has the diagnosis of obesity and her BMI today is 30.55 Katherleen is in the action stage of change   ADVISE: Brenlee was educated on the multiple health risks of obesity as well as the benefit of weight loss to improve her health. She was advised of the need for long term treatment and the importance of lifestyle modifications.  AGREE: Multiple dietary modification options and treatment options were discussed and  Leniya agreed to the above obesity treatment plan.  I, [name], am acting as transcriptionist for Dennard Nip, MD  I have reviewed the above documentation for accuracy and completeness, and I agree with the above. -Dennard Nip, MD

## 2017-06-02 ENCOUNTER — Other Ambulatory Visit (INDEPENDENT_AMBULATORY_CARE_PROVIDER_SITE_OTHER): Payer: Self-pay | Admitting: Family Medicine

## 2017-06-02 DIAGNOSIS — E119 Type 2 diabetes mellitus without complications: Secondary | ICD-10-CM

## 2017-06-02 DIAGNOSIS — Z794 Long term (current) use of insulin: Secondary | ICD-10-CM

## 2017-06-09 ENCOUNTER — Other Ambulatory Visit (INDEPENDENT_AMBULATORY_CARE_PROVIDER_SITE_OTHER): Payer: Self-pay | Admitting: Family Medicine

## 2017-06-09 DIAGNOSIS — F3289 Other specified depressive episodes: Secondary | ICD-10-CM

## 2017-06-10 ENCOUNTER — Ambulatory Visit (INDEPENDENT_AMBULATORY_CARE_PROVIDER_SITE_OTHER): Payer: BC Managed Care – PPO | Admitting: Family Medicine

## 2017-06-10 VITALS — BP 95/62 | HR 70 | Temp 98.2°F | Ht 69.0 in | Wt 201.0 lb

## 2017-06-10 DIAGNOSIS — Z6830 Body mass index (BMI) 30.0-30.9, adult: Secondary | ICD-10-CM | POA: Diagnosis not present

## 2017-06-10 DIAGNOSIS — E119 Type 2 diabetes mellitus without complications: Secondary | ICD-10-CM

## 2017-06-10 DIAGNOSIS — Z794 Long term (current) use of insulin: Secondary | ICD-10-CM | POA: Diagnosis not present

## 2017-06-10 DIAGNOSIS — F3289 Other specified depressive episodes: Secondary | ICD-10-CM

## 2017-06-10 DIAGNOSIS — E669 Obesity, unspecified: Secondary | ICD-10-CM | POA: Diagnosis not present

## 2017-06-10 MED ORDER — TOPIRAMATE 25 MG PO TABS: 25.0000 mg | ORAL_TABLET | Freq: Every day | ORAL | 0 refills | Status: DC

## 2017-06-10 MED ORDER — INSULIN PEN NEEDLE 32G X 4 MM MISC: 1.0000 | Freq: Every day | 0 refills | Status: DC

## 2017-06-10 MED ORDER — LIRAGLUTIDE 18 MG/3ML ~~LOC~~ SOPN: 1.8000 mg | PEN_INJECTOR | SUBCUTANEOUS | 0 refills | Status: DC

## 2017-06-13 ENCOUNTER — Other Ambulatory Visit (INDEPENDENT_AMBULATORY_CARE_PROVIDER_SITE_OTHER): Payer: Self-pay | Admitting: Family Medicine

## 2017-06-13 ENCOUNTER — Other Ambulatory Visit: Payer: Self-pay

## 2017-06-13 DIAGNOSIS — Z794 Long term (current) use of insulin: Secondary | ICD-10-CM

## 2017-06-13 DIAGNOSIS — E119 Type 2 diabetes mellitus without complications: Secondary | ICD-10-CM

## 2017-06-13 DIAGNOSIS — C50411 Malignant neoplasm of upper-outer quadrant of right female breast: Secondary | ICD-10-CM

## 2017-06-13 DIAGNOSIS — Z171 Estrogen receptor negative status [ER-]: Secondary | ICD-10-CM

## 2017-06-17 ENCOUNTER — Inpatient Hospital Stay: Payer: BC Managed Care – PPO

## 2017-06-17 ENCOUNTER — Inpatient Hospital Stay: Payer: BC Managed Care – PPO | Attending: Oncology

## 2017-06-17 VITALS — BP 114/62 | HR 65 | Temp 97.8°F | Resp 16

## 2017-06-17 DIAGNOSIS — C50411 Malignant neoplasm of upper-outer quadrant of right female breast: Secondary | ICD-10-CM

## 2017-06-17 DIAGNOSIS — Z5112 Encounter for antineoplastic immunotherapy: Secondary | ICD-10-CM | POA: Insufficient documentation

## 2017-06-17 DIAGNOSIS — Z171 Estrogen receptor negative status [ER-]: Secondary | ICD-10-CM

## 2017-06-17 DIAGNOSIS — Z95828 Presence of other vascular implants and grafts: Secondary | ICD-10-CM

## 2017-06-17 LAB — CBC WITH DIFFERENTIAL (CANCER CENTER ONLY)
Basophils Absolute: 0 10*3/uL (ref 0.0–0.1)
Basophils Relative: 0 %
Eosinophils Absolute: 0.1 10*3/uL (ref 0.0–0.5)
Eosinophils Relative: 3 %
HCT: 33.4 % — ABNORMAL LOW (ref 34.8–46.6)
Hemoglobin: 11.2 g/dL — ABNORMAL LOW (ref 11.6–15.9)
Lymphocytes Relative: 26 %
Lymphs Abs: 1.3 10*3/uL (ref 0.9–3.3)
MCH: 31.3 pg (ref 25.1–34.0)
MCHC: 33.5 g/dL (ref 31.5–36.0)
MCV: 93.3 fL (ref 79.5–101.0)
Monocytes Absolute: 0.3 10*3/uL (ref 0.1–0.9)
Monocytes Relative: 7 %
Neutro Abs: 3.1 10*3/uL (ref 1.5–6.5)
Neutrophils Relative %: 64 %
Platelet Count: 150 10*3/uL (ref 145–400)
RBC: 3.58 MIL/uL — ABNORMAL LOW (ref 3.70–5.45)
RDW: 13.8 % (ref 11.2–14.5)
WBC Count: 4.8 10*3/uL (ref 3.9–10.3)

## 2017-06-17 LAB — COMPREHENSIVE METABOLIC PANEL
ALT: 40 U/L (ref 14–54)
AST: 32 U/L (ref 15–41)
Albumin: 4 g/dL (ref 3.5–5.0)
Alkaline Phosphatase: 83 U/L (ref 38–126)
Anion gap: 12 (ref 5–15)
BUN: 44 mg/dL — ABNORMAL HIGH (ref 6–20)
CO2: 22 mmol/L (ref 22–32)
Calcium: 9.2 mg/dL (ref 8.9–10.3)
Chloride: 109 mmol/L (ref 101–111)
Creatinine, Ser: 1.89 mg/dL — ABNORMAL HIGH (ref 0.44–1.00)
GFR calc Af Amer: 31 mL/min — ABNORMAL LOW (ref >60)
GFR calc non Af Amer: 27 mL/min — ABNORMAL LOW (ref >60)
Glucose, Bld: 126 mg/dL — ABNORMAL HIGH (ref 65–99)
Potassium: 3.7 mmol/L (ref 3.5–5.1)
Sodium: 143 mmol/L (ref 135–145)
Total Bilirubin: 0.6 mg/dL (ref 0.3–1.2)
Total Protein: 7.1 g/dL (ref 6.5–8.1)

## 2017-06-17 MED ORDER — DIPHENHYDRAMINE HCL 12.5 MG/5ML PO ELIX
ORAL_SOLUTION | ORAL | Status: AC
Start: 2017-06-17 — End: ?
  Filled 2017-06-17: qty 5

## 2017-06-17 MED ORDER — SODIUM CHLORIDE 0.9 % IJ SOLN
10.0000 mL | INTRAMUSCULAR | Status: DC | PRN
  Administered 2017-06-17: 10 mL via INTRAVENOUS
  Filled 2017-06-17: qty 10

## 2017-06-17 MED ORDER — ACETAMINOPHEN 325 MG PO TABS
ORAL_TABLET | ORAL | Status: AC
  Filled 2017-06-17: qty 2

## 2017-06-17 MED ORDER — TRASTUZUMAB CHEMO 150 MG IV SOLR
600.0000 mg | Freq: Once | INTRAVENOUS | Status: AC
  Administered 2017-06-17: 600 mg via INTRAVENOUS
  Filled 2017-06-17: qty 28.57

## 2017-06-17 MED ORDER — SODIUM CHLORIDE 0.9% FLUSH
10.0000 mL | INTRAVENOUS | Status: DC | PRN
  Administered 2017-06-17: 10 mL
  Filled 2017-06-17: qty 10

## 2017-06-17 MED ORDER — SODIUM CHLORIDE 0.9 % IV SOLN
Freq: Once | INTRAVENOUS | Status: AC
  Administered 2017-06-17: 09:00:00 via INTRAVENOUS

## 2017-06-17 MED ORDER — ACETAMINOPHEN 325 MG PO TABS
650.0000 mg | ORAL_TABLET | Freq: Once | ORAL | Status: AC
  Administered 2017-06-17: 650 mg via ORAL

## 2017-06-17 MED ORDER — HEPARIN SOD (PORK) LOCK FLUSH 100 UNIT/ML IV SOLN
500.0000 [IU] | Freq: Once | INTRAVENOUS | Status: AC | PRN
  Administered 2017-06-17: 500 [IU]
  Filled 2017-06-17: qty 5

## 2017-06-17 MED ORDER — DIPHENHYDRAMINE HCL 12.5 MG/5ML PO ELIX
12.5000 mg | ORAL_SOLUTION | Freq: Once | ORAL | Status: AC
  Administered 2017-06-17: 12.5 mg via ORAL

## 2017-06-17 NOTE — Patient Instructions (Signed)
[facility] Discharge Instructions for Patients Receiving Chemotherapy  Today you received the following chemotherapy agents herceptin   To help prevent nausea and vomiting after your treatment, we encourage you to take your nausea medication as directed   If you develop nausea and vomiting that is not controlled by your nausea medication, call the clinic.   BELOW ARE SYMPTOMS THAT SHOULD BE REPORTED IMMEDIATELY:  *FEVER GREATER THAN 100.5 F  *CHILLS WITH OR WITHOUT FEVER  NAUSEA AND VOMITING THAT IS NOT CONTROLLED WITH YOUR NAUSEA MEDICATION  *UNUSUAL SHORTNESS OF BREATH  *UNUSUAL BRUISING OR BLEEDING  TENDERNESS IN MOUTH AND THROAT WITH OR WITHOUT PRESENCE OF ULCERS  *URINARY PROBLEMS  *BOWEL PROBLEMS  UNUSUAL RASH Items with * indicate a potential emergency and should be followed up as soon as possible.  Feel free to call the clinic you have any questions or concerns. The clinic phone number is [phone].  

## 2017-06-20 NOTE — Progress Notes (Signed)
Office: [phone]  /  Fax: [phone]   HPI:   Chief Complaint: OBESITY Darlene [name] is here to discuss her progress with her obesity treatment plan. She is on the keep a food journal with 1300-1500 calories and 80+ grams of protein daily and is following her eating plan approximately 70 % of the time. She states she is doing senior exercise class for 45 minutes 1 times per week. Darlene [name] is doing well with journaling and meeting her protein goals. She has started to exercise more in the last 2 to 3 weeks.  Her weight is 201 lb (91.2 kg) today and has had a weight loss of 6 pounds over a period of 3 weeks since her last visit. She has lost 24 lbs since starting treatment with [name].  Diabetes II Darlene [name] has a diagnosis of diabetes type II. 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 No glucose log today and she denies hypoglycemia, nausea, or vomiting. Last A1c was 6.1 on 03/11/17. She has been working on intensive lifestyle modifications including diet, exercise, and weight loss to help control her blood glucose levels.  Depression with emotional eating behaviors Darlene [name]'s mood is stable on topiramate. She denies paresthesias or word searching. Darlene [name] struggles with emotional eating and using food for comfort to the extent that it is negatively impacting her health. She often snacks when she is not hungry. Darlene [name] sometimes feels she is out of control and then feels guilty that she made poor food choices. She has been working on behavior modification techniques to help reduce her emotional eating and has been somewhat successful. She shows no sign of suicidal or homicidal ideations.  Depression screen [facility] 2/9 09/12/2016 08/09/2016 01/01/2016 11/15/2015 10/12/2015  Decreased Interest 0 1 0 0 0  Down, Depressed, Hopeless 0 1 0 0 0  PHQ - 2 Score 0 2 0 0 0  Altered sleeping - 0 - - -  Tired, decreased energy - 1 - - -  Change in appetite - 2 - - -  Feeling bad or failure about  yourself  - 2 - - -  Trouble concentrating - 1 - - -  Moving slowly or fidgety/restless - 1 - - -  Suicidal thoughts - 0 - - -  PHQ-9 Score - 9 - - -  Some recent data might be hidden    ALLERGIES: Allergies  Allergen Reactions  . Nsaids Other (See Comments)    H/o gastric bypass - avoid NSAIDs  . Tape Hives and Rash    Adhesives in bandaids    MEDICATIONS: Current Outpatient Medications on File Prior to Visit  Medication Sig Dispense Refill  . acetaminophen (TYLENOL) 325 MG tablet Take 650 mg by mouth 2 (two) times daily as needed for moderate pain or headache. Reported on 04/17/2015    . buPROPion (WELLBUTRIN SR) 200 MG 12 hr tablet Take 1 tablet (200 mg total) by mouth daily at 12 noon. 30 tablet 0  . calcium citrate-vitamin D (CITRACAL+D) 315-200 MG-UNIT tablet Take 1 tablet by mouth 3 (three) times daily.    . cholecalciferol (VITAMIN D) 1000 units tablet Take 1,000 Units by mouth daily.    . diphenhydrAMINE (BENADRYL) 25 MG tablet Take 25 mg by mouth daily as needed for allergies or sleep. Reported on 03/06/2015    . glucose blood (ONETOUCH VERIO) test strip 1 each by Other route as needed for other. Reported on 03/06/2015    . hydrochlorothiazide (HYDRODIURIL) 25 MG tablet TAKE 1 TABLET BY MOUTH  EVERY DAY 30 tablet 11  . Insulin Glargine (BASAGLAR KWIKPEN) 100 UNIT/ML SOPN Inject 0.16 mLs (16 Units total) into the skin at bedtime. 2 pen 0  . losartan (COZAAR) 100 MG tablet Take 1 tablet by mouth daily. Reported on 03/06/2015    . Melatonin 5 MG TABS Take 5 mg by mouth at bedtime. Reported on 03/06/2015    . metFORMIN (GLUCOPHAGE) 500 MG tablet Take 1 tablet (500 mg total) by mouth daily. 30 tablet 0  . mometasone (NASONEX) 50 MCG/ACT nasal spray Place 1-2 sprays into the nose daily as needed (for allergies.).     [name] ONETOUCH DELICA LANCETS 62I MISC 1 Package by Does not apply route daily. 100 each 0  . Pediatric Multivitamins-Iron (FLINTSTONES PLUS IRON) chewable tablet Chew 1  tablet by mouth 2 (two) times daily.    . Probiotic Product ([employer] PROBIOTIC PO) Take 1 capsule by mouth daily. Reported on 03/06/2015    . Propylene Glycol (SYSTANE BALANCE OP) Apply 1-2 drops to eye 3 (three) times daily.    [name] senna-docusate (SENNA PLUS) 8.6-50 MG tablet Take 2 tablets by mouth at bedtime.    . vortioxetine HBr (TRINTELLIX) 20 MG TABS Take 20 mg by mouth at bedtime. 30 tablet 0   No current facility-administered medications on file prior to visit.     PAST MEDICAL HISTORY: Past Medical History:  Diagnosis Date  . Anemia    hx of - during 1st round chemo  . Anxiety   . Arthritis    in fingers, shoulders  . Back pain   . Balance problem   . Breast cancer ([place])   . Breast cancer of upper-outer quadrant of right female breast ([place]) 07/22/2014  . Bunion, right foot   . Clumsiness   . Constipation   . Depression   . Diabetes mellitus without complication ([place])    94+ years  . Eating disorder    binge eating  . Epistaxis 08/26/2014  . Fatty liver   . HCAP (healthcare-associated pneumonia) 12/18/2014  . Headache   . Heart murmur    never had any problems  . History of radiation therapy 04/05/15-05/19/15   right chest wall was treated to 50.4 Gy in 28 fractions, right mastectomy scar was treated to 10 Gy in 5 fractions  . Hyperlipidemia   . Hypertension   . Joint pain   . Multiple food allergies   . Neuromuscular disorder ([place])    diabetic neuropathy  . Obesity   . Obstructive sleep apnea on CPAP    uses cpap nightly  . Ovarian cyst rupture    possible  . Pneumonia   . Pneumonia 11/2014  . Radiation 04/05/15-05/19/15   right chest wall 50.4 Gy, mastectomy scar 10 Gy  . Sleep apnea   . Thyroid nodule 06/2014    PAST SURGICAL HISTORY: Past Surgical History:  Procedure Laterality Date  . APPENDECTOMY    . BIOPSY THYROID Bilateral 06/2014   2 nodules - Benign   . BREAST LUMPECTOMY WITH RADIOACTIVE SEED AND SENTINEL LYMPH NODE BIOPSY Right 12/27/2014   Procedure:  BREAST LUMPECTOMY WITH RADIOACTIVE SEED AND SENTINEL LYMPH NODE BIOPSY;  Surgeon: Stark Klein, MD;  Location: [place];  Service: General;  Laterality: Right;  . BREAST REDUCTION SURGERY Bilateral 01/06/2015   Procedure: BILATERAL BREAST REDUCTION, RIGHT ONCOPLASTIC RECONSTRUCTION),LEFT BREAST REDUCTION FOR SYMMETRY;  Surgeon: [name], MD;  Location: [place];  Service: Plastics;  Laterality: Bilateral;  . BREATH TEK H PYLORI N/A  09/15/2012   Procedure: BREATH TEK H PYLORI;  Surgeon: Pedro Earls, MD;  Location: [facility];  Service: General;  Laterality: N/A;  . BUNIONECTOMY Left 12/2013  . CARPAL TUNNEL RELEASE Right   . CARPAL TUNNEL RELEASE Left   . COLONOSCOPY    . DUPUYTREN CONTRACTURE RELEASE    . GASTRIC ROUX-EN-Y N/A 11/02/2012   Procedure: LAPAROSCOPIC ROUX-EN-Y GASTRIC;  Surgeon: Pedro Earls, MD;  Location: [facility];  Service: General;  Laterality: N/A;  . IR GENERIC HISTORICAL  03/18/2016   IR US GUIDE VASC ACCESS LEFT 03/18/2016 Markus [place], MD [facility]  . IR GENERIC HISTORICAL  03/18/2016   IR FLUORO GUIDE CV LINE LEFT 03/18/2016 Markus [facility], MD [facility]  . LAPAROSCOPIC APPENDECTOMY N/A 07/22/2015   Procedure: APPENDECTOMY LAPAROSCOPIC;  Surgeon: Michael Boston, MD;  Location: [facility];  Service: General;  Laterality: N/A;  . MASTECTOMY Right 02/15/2015   modified  . MODIFIED MASTECTOMY Right 02/15/2015   Procedure: RIGHT MODIFIED RADICAL MASTECTOMY;  Surgeon: Stark Klein, MD;  Location: [place];  Service: General;  Laterality: Right;  . PORT-A-CATH REMOVAL Left 10/10/2015   Procedure: PORT REMOVAL;  Surgeon: Stark Klein, MD;  Location: [place];  Service: General;  Laterality: Left;  . PORTACATH PLACEMENT Left 08/02/2014   Procedure: INSERTION PORT-A-CATH;  Surgeon: Stark Klein, MD;  Location: [place];  Service: General;  Laterality: Left;  . PORTACATH PLACEMENT N/A 11/05/2016   Procedure: INSERTION PORT-A-CATH;  Surgeon: Stark Klein, MD;  Location: [place];  Service: General;  Laterality: N/A;  . ROTATOR CUFF REPAIR Right 2005  . SCAR REVISION Right 12/08/2015   Procedure: COMPLEX REPAIR RIGHT CHEST 10-15CM;  Surgeon: [name], MD;  Location: [place];  Service: Plastics;  Laterality: Right;  . TOE SURGERY  1970s   bone spur  . TRIGGER FINGER RELEASE     x3    SOCIAL HISTORY: Social History   Tobacco Use  . Smoking status: Never Smoker  . Smokeless tobacco: Never Used  Substance Use Topics  . Alcohol use: Yes    Alcohol/week: 0.0 oz    Comment: social  . Drug use: No    FAMILY HISTORY: Family History  Problem Relation Age of Onset  . CVA Mother   . Heart disease Mother   . Sudden death Mother   . Diabetes Father   . Heart failure Father   . Hypertension Father   . Kidney disease Father   . Obesity Father   . Hypertension Sister   . Diabetes Brother   . Breast cancer Paternal Grandmother   . Diabetes Paternal Uncle     ROS: Review of Systems  Constitutional: Positive for weight loss.  Gastrointestinal: Negative for nausea and vomiting.  Endo/Heme/Allergies:       Negative hypoglycemia  Psychiatric/Behavioral: Positive for depression. Negative for suicidal ideas.    PHYSICAL EXAM: Blood pressure 95/62, pulse 70, temperature 98.2 F (36.8 C), height 5\' 9"  (1.753 m), weight 201 lb (91.2 kg), last menstrual period 10/22/2007, SpO2 98 %. Body mass index is 29.68 kg/m. Physical Exam  Constitutional: She is oriented to person, place, and time. She appears well-developed and well-nourished.  Cardiovascular: Normal rate.  Pulmonary/Chest: Effort normal.  Musculoskeletal: Normal range of motion.  Neurological: She is oriented to person, place, and time.  Skin: Skin is warm and dry.  Psychiatric: She has a normal mood and affect. Her behavior is normal.  Vitals reviewed.   RECENT LABS AND  TESTS: BMET    Component Value Date/Time   NA 143 06/17/2017 0833   NA  143 03/11/2017 0841   NA 140 01/07/2017 0758   K 3.7 06/17/2017 0833   K 4.1 01/07/2017 0758   CL 109 06/17/2017 0833   CO2 22 06/17/2017 0833   CO2 24 01/07/2017 0758   GLUCOSE 126 (H) 06/17/2017 0833   GLUCOSE 134 01/07/2017 0758   BUN 44 (H) 06/17/2017 0833   BUN 40 (H) 03/11/2017 0841   BUN 33.6 (H) 01/07/2017 0758   CREATININE 1.89 (H) 06/17/2017 0833   CREATININE 1.62 (H) 05/20/2017 0813   CREATININE 1.5 (H) 01/07/2017 0758   CALCIUM 9.2 06/17/2017 0833   CALCIUM 9.3 01/07/2017 0758   GFRNONAA 27 (L) 06/17/2017 0833   GFRNONAA 33 (L) 05/20/2017 0813   GFRNONAA 55 (L) 02/18/2013 0827   GFRAA 31 (L) 06/17/2017 0833   GFRAA 38 (L) 05/20/2017 0813   GFRAA 63 02/18/2013 0827   Lab Results  Component Value Date   HGBA1C 6.1 (H) 03/11/2017   HGBA1C 8.2 (H) 11/05/2016   HGBA1C 8.1 (H) 08/09/2016   HGBA1C 6.5 (H) 01/03/2015   HGBA1C 6.9 (H) 02/18/2013   Lab Results  Component Value Date   INSULIN 15.3 03/11/2017   INSULIN 18.3 08/09/2016   CBC    Component Value Date/Time   WBC 4.8 06/17/2017 0823   WBC 6.1 02/25/2017 0845   RBC 3.58 (L) 06/17/2017 0823   HGB 11.2 (L) 06/17/2017 0823   HGB 10.8 (L) 01/07/2017 0758   HCT 33.4 (L) 06/17/2017 0823   HCT 31.9 (L) 01/07/2017 0758   PLT 150 06/17/2017 0823   PLT 126 (L) 01/07/2017 0758   MCV 93.3 06/17/2017 0823   MCV 90.8 01/07/2017 0758   MCH 31.3 06/17/2017 0823   MCHC 33.5 06/17/2017 0823   RDW 13.8 06/17/2017 0823   RDW 14.8 (H) 01/07/2017 0758   LYMPHSABS 1.3 06/17/2017 0823   LYMPHSABS 1.1 01/07/2017 0758   MONOABS 0.3 06/17/2017 0823   MONOABS 0.3 01/07/2017 0758   EOSABS 0.1 06/17/2017 0823   EOSABS 0.1 01/07/2017 0758   EOSABS 0.1 08/09/2016 1229   BASOSABS 0.0 06/17/2017 0823   BASOSABS 0.0 01/07/2017 0758   Iron/TIBC/Ferritin/ %Sat    Component Value Date/Time   IRON 58 02/18/2013 0827   TIBC 305 02/18/2013 0827   FERRITIN 133 10/31/2014 0816   IRONPCTSAT 19 (L) 02/18/2013 0827   Lipid Panel      Component Value Date/Time   CHOL 164 02/07/2017   CHOL 182 08/09/2016 1229   TRIG 114 02/07/2017   HDL 59 02/07/2017   HDL 62 08/09/2016 1229   CHOLHDL 2.9 02/18/2013 0827   VLDL 23 02/18/2013 0827   LDLCALC 83 02/07/2017   LDLCALC 85 08/09/2016 1229   Hepatic Function Panel     Component Value Date/Time   PROT 7.1 06/17/2017 0833   PROT 6.9 03/11/2017 0841   PROT 6.7 01/07/2017 0758   ALBUMIN 4.0 06/17/2017 0833   ALBUMIN 4.5 03/11/2017 0841   ALBUMIN 3.6 01/07/2017 0758   AST 32 06/17/2017 0833   AST 41 (H) 05/20/2017 0813   AST 51 (H) 01/07/2017 0758   ALT 40 06/17/2017 0833   ALT 59 (H) 05/20/2017 0813   ALT 82 (H) 01/07/2017 0758   ALKPHOS 83 06/17/2017 0833   ALKPHOS 115 01/07/2017 0758   BILITOT 0.6 06/17/2017 0833   BILITOT 0.6 05/20/2017 0813   BILITOT 0.72 01/07/2017 0758  Component Value Date/Time   TSH 1.54 02/07/2017   TSH 2.170 08/09/2016 1229    ASSESSMENT AND PLAN: Type 2 diabetes mellitus without complication, with long-term current use of insulin ([place]) - Plan: Insulin Pen Needle (BD PEN NEEDLE NANO U/F) 32G X 4 MM MISC, liraglutide (VICTOZA) 18 MG/3ML SOPN  Other depression - Plan: topiramate (TOPAMAX) 25 MG tablet  Class 1 obesity with serious comorbidity and body mass index (BMI) of 30.0 to 30.9 in adult, unspecified obesity type - Starting BMI greater then 30  PLAN:  Diabetes II Margarette has been given extensive diabetes education by myself today including ideal fasting and post-prandial blood glucose readings, individual ideal Hgb A1c goals  and hypoglycemia prevention. We discussed the importance of good blood sugar control to decrease the likelihood of diabetic complications such as nephropathy, neuropathy, limb loss, blindness, coronary artery disease, and death. We discussed the importance of intensive lifestyle modification including diet, exercise and weight loss as the first line treatment for diabetes. Elouise agrees to continue  Victoza 1.8 mg #3 pens and we will refill for 1 month and we will refill pen needles. Dreana agrees to follow up with our clinic in 3 to 4 weeks.  Depression with Emotional Eating Behaviors We discussed behavior modification techniques today to help Nasteho deal with her emotional eating and depression. Amarianna agrees to continue taking topiramate 25 mg qd #30 and we will refill for 1 month. Sheela agrees to follow up with our clinic in 3 to 4 weeks.  Obesity Tula is currently in the action stage of change. As such, her goal is to continue with weight loss efforts She has agreed to keep a food journal with 1200-1500 calories and 80+ grams of protein daily Ambrosia has been instructed to work up to a goal of 150 minutes of combined cardio and strengthening exercise per week for weight loss and overall health benefits. We discussed the following Behavioral Modification Strategies today: increasing lean protein intake, decreasing simple carbohydrates, and increase H20 intake    Neesa has agreed to follow up with our clinic in 3 to 4 weeks. She was informed of the importance of frequent follow up visits to maximize her success with intensive lifestyle modifications for her multiple health conditions.   OBESITY BEHAVIORAL INTERVENTION VISIT  Today's visit was # 19 out of 22.  Starting weight: 225 lbs Starting date: 08/09/16 Today's weight : 201 lbs Today's date: 06/10/2017 Total lbs lost to date: 24 (Patients must lose 7 lbs in the first 6 months to continue with counseling)   ASK: We discussed the diagnosis of obesity with [name] today and Leonette agreed to give [name] permission to discuss obesity behavioral modification therapy today.  ASSESS: Rylee has the diagnosis of obesity and her BMI today is 29.67 Latrina is in the action stage of change   ADVISE: Sephora was educated on the multiple health risks of obesity as well as the benefit of weight loss to  improve her health. She was advised of the need for long term treatment and the importance of lifestyle modifications.  AGREE: Multiple dietary modification options and treatment options were discussed and  Cella agreed to the above obesity treatment plan.  I, [name], am acting as transcriptionist for Dennard Nip, MD  I have reviewed the above documentation for accuracy and completeness, and I agree with the above. -Dennard Nip, MD

## 2017-07-01 ENCOUNTER — Ambulatory Visit (INDEPENDENT_AMBULATORY_CARE_PROVIDER_SITE_OTHER): Payer: BC Managed Care – PPO | Admitting: Family Medicine

## 2017-07-01 VITALS — BP 99/63 | HR 62 | Temp 98.2°F | Ht 69.0 in | Wt 200.0 lb

## 2017-07-01 DIAGNOSIS — E669 Obesity, unspecified: Secondary | ICD-10-CM

## 2017-07-01 DIAGNOSIS — Z794 Long term (current) use of insulin: Secondary | ICD-10-CM

## 2017-07-01 DIAGNOSIS — E119 Type 2 diabetes mellitus without complications: Secondary | ICD-10-CM

## 2017-07-01 DIAGNOSIS — Z6830 Body mass index (BMI) 30.0-30.9, adult: Secondary | ICD-10-CM | POA: Diagnosis not present

## 2017-07-01 MED ORDER — LIRAGLUTIDE 18 MG/3ML ~~LOC~~ SOPN: 1.8000 mg | PEN_INJECTOR | SUBCUTANEOUS | 0 refills | Status: DC

## 2017-07-01 NOTE — Progress Notes (Signed)
Office: [phone]  /  Fax: [phone]   HPI:   Chief Complaint: OBESITY Darlene [name] is here to discuss her progress with her obesity treatment plan. She is on the keep a food journal with 1200-1500 calories and 80+ grams of protein daily and is following her eating plan approximately 70 % of the time. She states she is doing AHOY class for 45 minutes 2 times per week. Darlene [name] continues to do well with weight loss. She is journaling and working on [employer] protein. Hunger is controlled but her husband has back surgery tomorrow and she is worried about caring for him.  Her weight is 200 lb (90.7 kg) today and has had a weight loss of 1 pound over a period of 3 weeks since her last visit. She has lost 25 lbs since starting treatment with [name].  Diabetes II Darlene [name] has a diagnosis of diabetes type II. Darlene [name] states fasting BGs range between 74 and 100, she is doing well on diet prescription and medications but is at risk of hypoglycemia. Last A1c was 6.1 on 03/11/17. She has been working on intensive lifestyle modifications including diet, exercise, and weight loss to help control her blood glucose levels.  ALLERGIES: Allergies  Allergen Reactions  . Nsaids Other (See Comments)    H/o gastric bypass - avoid NSAIDs  . Tape Hives and Rash    Adhesives in bandaids    MEDICATIONS: Current Outpatient Medications on File Prior to Visit  Medication Sig Dispense Refill  . acetaminophen (TYLENOL) 325 MG tablet Take 650 mg by mouth 2 (two) times daily as needed for moderate pain or headache. Reported on 04/17/2015    . buPROPion (WELLBUTRIN SR) 200 MG 12 hr tablet Take 1 tablet (200 mg total) by mouth daily at 12 noon. 30 tablet 0  . calcium citrate-vitamin D (CITRACAL+D) 315-200 MG-UNIT tablet Take 1 tablet by mouth 3 (three) times daily.    . cholecalciferol (VITAMIN D) 1000 units tablet Take 1,000 Units by mouth daily.    . diphenhydrAMINE (BENADRYL) 25 MG tablet Take 25 mg by mouth  daily as needed for allergies or sleep. Reported on 03/06/2015    . glucose blood (ONETOUCH VERIO) test strip 1 each by Other route as needed for other. Reported on 03/06/2015    . hydrochlorothiazide (HYDRODIURIL) 25 MG tablet TAKE 1 TABLET BY MOUTH EVERY DAY 30 tablet 11  . Insulin Glargine (BASAGLAR KWIKPEN) 100 UNIT/ML SOPN Inject 0.16 mLs (16 Units total) into the skin at bedtime. (Patient taking differently: Inject 12 Units into the skin at bedtime. ) 2 pen 0  . Insulin Pen Needle (BD PEN NEEDLE NANO U/F) 32G X 4 MM MISC 1 Package by Does not apply route daily at 12 noon. 100 each 0  . losartan (COZAAR) 100 MG tablet Take 1 tablet by mouth daily. Reported on 03/06/2015    . Melatonin 5 MG TABS Take 5 mg by mouth at bedtime. Reported on 03/06/2015    . metFORMIN (GLUCOPHAGE) 500 MG tablet Take 1 tablet (500 mg total) by mouth daily. 30 tablet 0  . mometasone (NASONEX) 50 MCG/ACT nasal spray Place 1-2 sprays into the nose daily as needed (for allergies.).     [name] ONETOUCH DELICA LANCETS 78L MISC 1 Package by Does not apply route daily. 100 each 0  . Pediatric Multivitamins-Iron (FLINTSTONES PLUS IRON) chewable tablet Chew 1 tablet by mouth 2 (two) times daily.    . Probiotic Product ([employer] PROBIOTIC PO) Take 1 capsule by mouth  daily. Reported on 03/06/2015    . Propylene Glycol (SYSTANE BALANCE OP) Apply 1-2 drops to eye 3 (three) times daily.    [name] senna-docusate (SENNA PLUS) 8.6-50 MG tablet Take 2 tablets by mouth at bedtime.    . topiramate (TOPAMAX) 25 MG tablet Take 1 tablet (25 mg total) by mouth daily. 30 tablet 0  . vortioxetine HBr (TRINTELLIX) 20 MG TABS Take 20 mg by mouth at bedtime. 30 tablet 0   No current facility-administered medications on file prior to visit.     PAST MEDICAL HISTORY: Past Medical History:  Diagnosis Date  . Anemia    hx of - during 1st round chemo  . Anxiety   . Arthritis    in fingers, shoulders  . Back pain   . Balance problem   . Breast cancer ([place])     . Breast cancer of upper-outer quadrant of right female breast ([place]) 07/22/2014  . Bunion, right foot   . Clumsiness   . Constipation   . Depression   . Diabetes mellitus without complication ([place])    08+ years  . Eating disorder    binge eating  . Epistaxis 08/26/2014  . Fatty liver   . HCAP (healthcare-associated pneumonia) 12/18/2014  . Headache   . Heart murmur    never had any problems  . History of radiation therapy 04/05/15-05/19/15   right chest wall was treated to 50.4 Gy in 28 fractions, right mastectomy scar was treated to 10 Gy in 5 fractions  . Hyperlipidemia   . Hypertension   . Joint pain   . Multiple food allergies   . Neuromuscular disorder ([place])    diabetic neuropathy  . Obesity   . Obstructive sleep apnea on CPAP    uses cpap nightly  . Ovarian cyst rupture    possible  . Pneumonia   . Pneumonia 11/2014  . Radiation 04/05/15-05/19/15   right chest wall 50.4 Gy, mastectomy scar 10 Gy  . Sleep apnea   . Thyroid nodule 06/2014    PAST SURGICAL HISTORY: Past Surgical History:  Procedure Laterality Date  . APPENDECTOMY    . BIOPSY THYROID Bilateral 06/2014   2 nodules - Benign   . BREAST LUMPECTOMY WITH RADIOACTIVE SEED AND SENTINEL LYMPH NODE BIOPSY Right 12/27/2014   Procedure: BREAST LUMPECTOMY WITH RADIOACTIVE SEED AND SENTINEL LYMPH NODE BIOPSY;  Surgeon: Stark Klein, MD;  Location: [facility];  Service: General;  Laterality: Right;  . BREAST REDUCTION SURGERY Bilateral 01/06/2015   Procedure: BILATERAL BREAST REDUCTION, RIGHT ONCOPLASTIC RECONSTRUCTION),LEFT BREAST REDUCTION FOR SYMMETRY;  Surgeon: [name], MD;  Location: [facility];  Service: Plastics;  Laterality: Bilateral;  . BREATH TEK H PYLORI N/A 09/15/2012   Procedure: BREATH TEK H PYLORI;  Surgeon: Pedro Earls, MD;  Location: [facility];  Service: General;  Laterality: N/A;  . BUNIONECTOMY Left 12/2013  . CARPAL TUNNEL RELEASE Right   . CARPAL TUNNEL RELEASE Left   .  COLONOSCOPY    . DUPUYTREN CONTRACTURE RELEASE    . GASTRIC ROUX-EN-Y N/A 11/02/2012   Procedure: LAPAROSCOPIC ROUX-EN-Y GASTRIC;  Surgeon: Pedro Earls, MD;  Location: [facility];  Service: General;  Laterality: N/A;  . IR GENERIC HISTORICAL  03/18/2016   IR US GUIDE VASC ACCESS LEFT 03/18/2016 Markus Daft, MD [facility]  . IR GENERIC HISTORICAL  03/18/2016   IR FLUORO GUIDE CV LINE LEFT 03/18/2016 Markus Daft, MD [facility]  . LAPAROSCOPIC APPENDECTOMY N/A 07/22/2015   Procedure: APPENDECTOMY  LAPAROSCOPIC;  Surgeon: Michael Boston, MD;  Location: [facility];  Service: General;  Laterality: N/A;  . MASTECTOMY Right 02/15/2015   modified  . MODIFIED MASTECTOMY Right 02/15/2015   Procedure: RIGHT MODIFIED RADICAL MASTECTOMY;  Surgeon: Stark Klein, MD;  Location: [facility];  Service: General;  Laterality: Right;  . PORT-A-CATH REMOVAL Left 10/10/2015   Procedure: PORT REMOVAL;  Surgeon: Stark Klein, MD;  Location: [facility];  Service: General;  Laterality: Left;  . PORTACATH PLACEMENT Left 08/02/2014   Procedure: INSERTION PORT-A-CATH;  Surgeon: Stark Klein, MD;  Location: [facility];  Service: General;  Laterality: Left;  . PORTACATH PLACEMENT N/A 11/05/2016   Procedure: INSERTION PORT-A-CATH;  Surgeon: Stark Klein, MD;  Location: [facility];  Service: General;  Laterality: N/A;  . ROTATOR CUFF REPAIR Right 2005  . SCAR REVISION Right 12/08/2015   Procedure: COMPLEX REPAIR RIGHT CHEST 10-15CM;  Surgeon: [name], MD;  Location: [facility];  Service: Plastics;  Laterality: Right;  . TOE SURGERY  1970s   bone spur  . TRIGGER FINGER RELEASE     x3    SOCIAL HISTORY: Social History   Tobacco Use  . Smoking status: Never Smoker  . Smokeless tobacco: Never Used  Substance Use Topics  . Alcohol use: Yes    Alcohol/week: 0.0 oz    Comment: social  . Drug use: No    FAMILY HISTORY: Family History  Problem Relation Age of Onset  . CVA Mother   . Heart disease  Mother   . Sudden death Mother   . Diabetes Father   . Heart failure Father   . Hypertension Father   . Kidney disease Father   . Obesity Father   . Hypertension Sister   . Diabetes Brother   . Breast cancer Paternal Grandmother   . Diabetes Paternal Uncle     ROS: Review of Systems  Constitutional: Positive for weight loss.  Endo/Heme/Allergies:       Positive hypoglycemia    PHYSICAL EXAM: Blood pressure 99/63, pulse 62, temperature 98.2 F (36.8 C), temperature source Oral, height 5\' 9"  (1.753 m), weight 200 lb (90.7 kg), last menstrual period 10/22/2007, SpO2 100 %. Body mass index is 29.53 kg/m. Physical Exam  Constitutional: She is oriented to person, place, and time. She appears well-developed and well-nourished.  Cardiovascular: Normal rate.  Pulmonary/Chest: Effort normal.  Musculoskeletal: Normal range of motion.  Neurological: She is oriented to person, place, and time.  Skin: Skin is warm and dry.  Psychiatric: She has a normal mood and affect. Her behavior is normal.  Vitals reviewed.   RECENT LABS AND TESTS: BMET    Component Value Date/Time   NA 143 06/17/2017 0833   NA 143 03/11/2017 0841   NA 140 01/07/2017 0758   K 3.7 06/17/2017 0833   K 4.1 01/07/2017 0758   CL 109 06/17/2017 0833   CO2 22 06/17/2017 0833   CO2 24 01/07/2017 0758   GLUCOSE 126 (H) 06/17/2017 0833   GLUCOSE 134 01/07/2017 0758   BUN 44 (H) 06/17/2017 0833   BUN 40 (H) 03/11/2017 0841   BUN 33.6 (H) 01/07/2017 0758   CREATININE 1.89 (H) 06/17/2017 0833   CREATININE 1.62 (H) 05/20/2017 0813   CREATININE 1.5 (H) 01/07/2017 0758   CALCIUM 9.2 06/17/2017 0833   CALCIUM 9.3 01/07/2017 0758   GFRNONAA 27 (L) 06/17/2017 0833   GFRNONAA 33 (L) 05/20/2017 0813   GFRNONAA 55 (L) 02/18/2013 0827   GFRAA 31 (  L) 06/17/2017 0833   GFRAA 38 (L) 05/20/2017 0813   GFRAA 63 02/18/2013 0827   Lab Results  Component Value Date   HGBA1C 6.1 (H) 03/11/2017   HGBA1C 8.2 (H) 11/05/2016    HGBA1C 8.1 (H) 08/09/2016   HGBA1C 6.5 (H) 01/03/2015   HGBA1C 6.9 (H) 02/18/2013   Lab Results  Component Value Date   INSULIN 15.3 03/11/2017   INSULIN 18.3 08/09/2016   CBC    Component Value Date/Time   WBC 4.8 06/17/2017 0823   WBC 6.1 02/25/2017 0845   RBC 3.58 (L) 06/17/2017 0823   HGB 11.2 (L) 06/17/2017 0823   HGB 10.8 (L) 01/07/2017 0758   HCT 33.4 (L) 06/17/2017 0823   HCT 31.9 (L) 01/07/2017 0758   PLT 150 06/17/2017 0823   PLT 126 (L) 01/07/2017 0758   MCV 93.3 06/17/2017 0823   MCV 90.8 01/07/2017 0758   MCH 31.3 06/17/2017 0823   MCHC 33.5 06/17/2017 0823   RDW 13.8 06/17/2017 0823   RDW 14.8 (H) 01/07/2017 0758   LYMPHSABS 1.3 06/17/2017 0823   LYMPHSABS 1.1 01/07/2017 0758   MONOABS 0.3 06/17/2017 0823   MONOABS 0.3 01/07/2017 0758   EOSABS 0.1 06/17/2017 0823   EOSABS 0.1 01/07/2017 0758   EOSABS 0.1 08/09/2016 1229   BASOSABS 0.0 06/17/2017 0823   BASOSABS 0.0 01/07/2017 0758   Iron/TIBC/Ferritin/ %Sat    Component Value Date/Time   IRON 58 02/18/2013 0827   TIBC 305 02/18/2013 0827   FERRITIN 133 10/31/2014 0816   IRONPCTSAT 19 (L) 02/18/2013 0827   Lipid Panel     Component Value Date/Time   CHOL 164 02/07/2017   CHOL 182 08/09/2016 1229   TRIG 114 02/07/2017   HDL 59 02/07/2017   HDL 62 08/09/2016 1229   CHOLHDL 2.9 02/18/2013 0827   VLDL 23 02/18/2013 0827   LDLCALC 83 02/07/2017   LDLCALC 85 08/09/2016 1229   Hepatic Function Panel     Component Value Date/Time   PROT 7.1 06/17/2017 0833   PROT 6.9 03/11/2017 0841   PROT 6.7 01/07/2017 0758   ALBUMIN 4.0 06/17/2017 0833   ALBUMIN 4.5 03/11/2017 0841   ALBUMIN 3.6 01/07/2017 0758   AST 32 06/17/2017 0833   AST 41 (H) 05/20/2017 0813   AST 51 (H) 01/07/2017 0758   ALT 40 06/17/2017 0833   ALT 59 (H) 05/20/2017 0813   ALT 82 (H) 01/07/2017 0758   ALKPHOS 83 06/17/2017 0833   ALKPHOS 115 01/07/2017 0758   BILITOT 0.6 06/17/2017 0833   BILITOT 0.6 05/20/2017 0813    BILITOT 0.72 01/07/2017 0758      Component Value Date/Time   TSH 1.54 02/07/2017   TSH 2.170 08/09/2016 1229    ASSESSMENT AND PLAN: Type 2 diabetes mellitus without complication, with long-term current use of insulin ([place]) - Plan: liraglutide (VICTOZA) 18 MG/3ML SOPN  Class 1 obesity with serious comorbidity and body mass index (BMI) of 30.0 to 30.9 in adult, unspecified obesity type - Starting BMI greater then 30  PLAN:  Diabetes II Lidie has been given extensive diabetes education by myself today including ideal fasting and post-prandial blood glucose readings, individual ideal Hgb A1c goals and hypoglycemia prevention. We discussed the importance of good blood sugar control to decrease the likelihood of diabetic complications such as nephropathy, neuropathy, limb loss, blindness, coronary artery disease, and death. We discussed the importance of intensive lifestyle modification including diet, exercise and weight loss as the first line treatment for diabetes.  Artemis agrees to continue Victoza 1.8 mg q AM #3 pens and we will refill for 1 month, she agrees to decrease Basaglar to 12 units. Shephanie agrees to follow up with our clinic in 4 weeks.  Obesity Atiana is currently in the action stage of change. As such, her goal is to continue with weight loss efforts She has agreed to keep a food journal with 1300-1500 calories and 80+ grams of protein daily Thatiana has been instructed to work up to a goal of 150 minutes of combined cardio and strengthening exercise per week for weight loss and overall health benefits. We discussed the following Behavioral Modification Strategies today: increasing lean protein intake, dealing with family or coworker sabotage, emotional eating strategies, and better snacking choices   Mikayela has agreed to follow up with our clinic in 4 weeks. She was informed of the importance of frequent follow up visits to maximize her success with intensive  lifestyle modifications for her multiple health conditions.   OBESITY BEHAVIORAL INTERVENTION VISIT  Today's visit was # 20 out of 22.  Starting weight: 225 lbs Starting date: 08/09/16 Today's weight : 200 lbs Today's date: 07/01/2017 Total lbs lost to date: 25 (Patients must lose 7 lbs in the first 6 months to continue with counseling)   ASK: We discussed the diagnosis of obesity with [name] today and Keri agreed to give [name] permission to discuss obesity behavioral modification therapy today.  ASSESS: Jenniffer has the diagnosis of obesity and her BMI today is 29.52 Debbi is in the action stage of change   ADVISE: Glori was educated on the multiple health risks of obesity as well as the benefit of weight loss to improve her health. She was advised of the need for long term treatment and the importance of lifestyle modifications.  AGREE: Multiple dietary modification options and treatment options were discussed and  Shanelle agreed to the above obesity treatment plan.  I, [name], am acting as transcriptionist for Dennard Nip, MD  I have reviewed the above documentation for accuracy and completeness, and I agree with the above. -Dennard Nip, MD

## 2017-07-09 ENCOUNTER — Other Ambulatory Visit (INDEPENDENT_AMBULATORY_CARE_PROVIDER_SITE_OTHER): Payer: Self-pay | Admitting: Family Medicine

## 2017-07-09 DIAGNOSIS — E119 Type 2 diabetes mellitus without complications: Secondary | ICD-10-CM

## 2017-07-09 DIAGNOSIS — Z794 Long term (current) use of insulin: Secondary | ICD-10-CM

## 2017-07-10 ENCOUNTER — Ambulatory Visit (HOSPITAL_COMMUNITY)
Admission: RE | Admit: 2017-07-10 | Discharge: 2017-07-10 | Disposition: A | Discharge status: Home / Self Care | Payer: BC Managed Care – PPO | Source: Ambulatory Visit | Attending: Oncology | Admitting: Oncology

## 2017-07-10 DIAGNOSIS — Z79899 Other long term (current) drug therapy: Secondary | ICD-10-CM | POA: Insufficient documentation

## 2017-07-10 DIAGNOSIS — I700 Atherosclerosis of aorta: Secondary | ICD-10-CM | POA: Diagnosis not present

## 2017-07-10 DIAGNOSIS — C50411 Malignant neoplasm of upper-outer quadrant of right female breast: Secondary | ICD-10-CM | POA: Diagnosis not present

## 2017-07-10 DIAGNOSIS — K7689 Other specified diseases of liver: Secondary | ICD-10-CM | POA: Diagnosis not present

## 2017-07-10 DIAGNOSIS — E119 Type 2 diabetes mellitus without complications: Secondary | ICD-10-CM | POA: Diagnosis not present

## 2017-07-10 DIAGNOSIS — Z794 Long term (current) use of insulin: Secondary | ICD-10-CM | POA: Diagnosis not present

## 2017-07-10 DIAGNOSIS — Z171 Estrogen receptor negative status [ER-]: Secondary | ICD-10-CM | POA: Insufficient documentation

## 2017-07-10 DIAGNOSIS — C50911 Malignant neoplasm of unspecified site of right female breast: Secondary | ICD-10-CM

## 2017-07-10 LAB — GLUCOSE, CAPILLARY: Glucose-Capillary: 106 mg/dL — ABNORMAL HIGH (ref 65–99)

## 2017-07-10 MED ORDER — FLUDEOXYGLUCOSE F - 18 (FDG) INJECTION
10.0000 | Freq: Once | INTRAVENOUS | Status: AC
  Administered 2017-07-10: 10 via INTRAVENOUS

## 2017-07-11 ENCOUNTER — Other Ambulatory Visit: Payer: Self-pay | Admitting: Oncology

## 2017-07-12 ENCOUNTER — Other Ambulatory Visit (INDEPENDENT_AMBULATORY_CARE_PROVIDER_SITE_OTHER): Payer: Self-pay | Admitting: Family Medicine

## 2017-07-12 DIAGNOSIS — Z794 Long term (current) use of insulin: Secondary | ICD-10-CM

## 2017-07-12 DIAGNOSIS — E119 Type 2 diabetes mellitus without complications: Secondary | ICD-10-CM

## 2017-07-13 ENCOUNTER — Other Ambulatory Visit (INDEPENDENT_AMBULATORY_CARE_PROVIDER_SITE_OTHER): Payer: Self-pay | Admitting: Family Medicine

## 2017-07-13 DIAGNOSIS — E119 Type 2 diabetes mellitus without complications: Secondary | ICD-10-CM

## 2017-07-13 DIAGNOSIS — Z794 Long term (current) use of insulin: Secondary | ICD-10-CM

## 2017-07-14 ENCOUNTER — Telehealth (INDEPENDENT_AMBULATORY_CARE_PROVIDER_SITE_OTHER): Payer: Self-pay | Admitting: Family Medicine

## 2017-07-14 ENCOUNTER — Encounter: Payer: Self-pay | Admitting: Oncology

## 2017-07-14 ENCOUNTER — Other Ambulatory Visit (INDEPENDENT_AMBULATORY_CARE_PROVIDER_SITE_OTHER): Payer: Self-pay | Admitting: Family Medicine

## 2017-07-14 ENCOUNTER — Encounter (INDEPENDENT_AMBULATORY_CARE_PROVIDER_SITE_OTHER): Payer: Self-pay

## 2017-07-14 DIAGNOSIS — F3289 Other specified depressive episodes: Secondary | ICD-10-CM

## 2017-07-14 MED ORDER — METFORMIN HCL 500 MG PO TABS: 500.0000 mg | ORAL_TABLET | Freq: Every day | ORAL | 0 refills | Status: DC

## 2017-07-14 NOTE — Progress Notes (Signed)
[place]  Telephone:([phone]) [phone] Fax:(336) [phone]   ID: [name] DOB: [birth year]  MR#: [id number]  LFY#:[id number]  Patient Care Team: Vernie Shanks, MD as PCP - General (Family Medicine) Himmelrich, Bryson Ha, RD (Inactive) as Dietitian (Bariatrics) Stark Klein, MD as Consulting Physician (General Surgery) Magrinat, Virgie Dad, MD as Consulting Physician (Oncology) Arloa Koh, MD as Consulting Physician (Radiation Oncology) Mauro Kaufmann, RN as Registered Nurse Rockwell Germany, RN as Registered Nurse Jacelyn Pi, MD as Consulting Physician (Endocrinology) Kem Boroughs, [place] as Nurse Practitioner (Family Medicine) [name], MD as Consulting Physician (Plastic Surgery) Larey Dresser, MD as Consulting Physician (Cardiology) Gery Pray, MD as Consulting Physician (Radiation Oncology) PCP: Vernie Shanks, MD OTHER MD:  CHIEF COMPLAINT:  HER-2 positive, estrogen receptor negative locally recurrent disease  CURRENT TREATMENT:  Trastuzumab q. 28 days  INTERVAL HISTORY:  Darlene [name] returns today for follow-up and treatment of her estrogen receptor negative but HER-2 amplified breast cancer accompanied by her husband. She continues on trastuzumab given every 28 days with a dose due today. She tolerates this treatment generally well. She denies diarrhea, fatigue, nausea, vomiting or problems with her port.  Her most recent echocardiogram on 04/24/2017 showed an ejection fraction in the 55-60% range.   She completed a PET scan on 07/10/2017 showing: New hypermetabolic lesion is identified within the right lobe of liver, suspicious for metastatic disease. Incidental findings including aortic atherosclerosis and 3 vessel coronary artery atherosclerotic calcifications, gallstones, and kidney stones.  Aortic Atherosclerosis (ICD10-I70.0).    REVIEW OF SYSTEMS: Darlene [name] reports that she is stressed and upset that her insurance company didn't  cover $77K worth of medical bills from 2018, because her company went out of business. She notes that they are saying that she now has to cover these costs.  This is causing her to be very depressed.  She is not exercising regularly.  Ideally she would be using an exercise bike but she does not feel that she can afford to join a gym at this point, with those debts hanging over their head.  She is planning on getting a Chief Executive Officer and suing her health insurance company.  All this is extremely distressing to her and she is feeling overwhelmed..  She is not currently working, and it has been difficult to find a new job and get financial assistance for her medical bills.  This is affecting her ability to cope with her cancer.  Otherwise she denies unusual headaches, visual changes, nausea, vomiting, or dizziness. There has been no unusual cough, phlegm production, or pleurisy. This been no change in bowel or bladder habits. She denies unexplained fatigue or unexplained weight loss, bleeding, rash, or fever. A detailed review of systems was otherwise stable.      BREAST CANCER HISTORY: From the original intake note:  "Darlene [name]" had screening mammography at her gynecologist suggestive of a possible abnormality in the right breast. However right diagnostic mammogram 04/12/2013 at [facility] was negative. More recently however, she noted a lump in her right breast and brought it to her gynecologist's attention. On 07/18/2014 the patient had bilateral diagnostic mammography with tomosynthesis at [facility]. The breast density was category B. In the right breast upper outer quadrant there was an area of focal asymmetry with indistinct margins.[name] Ultrasound was obtained and showed in addition to the mass in question, measuring 1.7 cm, an abnormal-appearing lymph node in the right axillary tail.  On 07/19/2014 the patient underwent biopsy of the right  breast massin question as well as a right axillary lymph node. Both were positive for  invasive ductal carcinoma, grade 2, estrogen and progesterone receptor negative, with an MIB-1 of 90%, and HER-2 pending. Incidentally the lymph node biopsy showed a lymphocytic inflammatory component but no lymph node architecture.  Her subsequent history is as detailed below.  PAST MEDICAL HISTORY: Past Medical History:  Diagnosis Date  . Anemia    hx of - during 1st round chemo  . Anxiety   . Arthritis    in fingers, shoulders  . Back pain   . Balance problem   . Breast cancer ([place])   . Breast cancer of upper-outer quadrant of right female breast ([place]) 07/22/2014  . Bunion, right foot   . Clumsiness   . Constipation   . Depression   . Diabetes mellitus without complication ([place])    25+ years  . Eating disorder    binge eating  . Epistaxis 08/26/2014  . Fatty liver   . HCAP (healthcare-associated pneumonia) 12/18/2014  . Headache   . Heart murmur    never had any problems  . History of radiation therapy 04/05/15-05/19/15   right chest wall was treated to 50.4 Gy in 28 fractions, right mastectomy scar was treated to 10 Gy in 5 fractions  . Hyperlipidemia   . Hypertension   . Joint pain   . Multiple food allergies   . Neuromuscular disorder ([place])    diabetic neuropathy  . Obesity   . Obstructive sleep apnea on CPAP    uses cpap nightly  . Ovarian cyst rupture    possible  . Pneumonia   . Pneumonia 11/2014  . Radiation 04/05/15-05/19/15   right chest wall 50.4 Gy, mastectomy scar 10 Gy  . Sleep apnea   . Thyroid nodule 06/2014    PAST SURGICAL HISTORY: Past Surgical History:  Procedure Laterality Date  . APPENDECTOMY    . BIOPSY THYROID Bilateral 06/2014   2 nodules - Benign   . BREAST LUMPECTOMY WITH RADIOACTIVE SEED AND SENTINEL LYMPH NODE BIOPSY Right 12/27/2014   Procedure: BREAST LUMPECTOMY WITH RADIOACTIVE SEED AND SENTINEL LYMPH NODE BIOPSY;  Surgeon: Stark Klein, MD;  Location: [facility];  Service: General;  Laterality: Right;  . BREAST  REDUCTION SURGERY Bilateral 01/06/2015   Procedure: BILATERAL BREAST REDUCTION, RIGHT ONCOPLASTIC RECONSTRUCTION),LEFT BREAST REDUCTION FOR SYMMETRY;  Surgeon: [name], MD;  Location: [facility];  Service: Plastics;  Laterality: Bilateral;  . BREATH TEK H PYLORI N/A 09/15/2012   Procedure: BREATH TEK H PYLORI;  Surgeon: Pedro Earls, MD;  Location: [facility];  Service: General;  Laterality: N/A;  . BUNIONECTOMY Left 12/2013  . CARPAL TUNNEL RELEASE Right   . CARPAL TUNNEL RELEASE Left   . COLONOSCOPY    . DUPUYTREN CONTRACTURE RELEASE    . GASTRIC ROUX-EN-Y N/A 11/02/2012   Procedure: LAPAROSCOPIC ROUX-EN-Y GASTRIC;  Surgeon: Pedro Earls, MD;  Location: [facility];  Service: General;  Laterality: N/A;  . IR GENERIC HISTORICAL  03/18/2016   IR US GUIDE VASC ACCESS LEFT 03/18/2016 Markus Daft, MD [facility]  . IR GENERIC HISTORICAL  03/18/2016   IR FLUORO GUIDE CV LINE LEFT 03/18/2016 Markus Daft, MD [facility]  . LAPAROSCOPIC APPENDECTOMY N/A 07/22/2015   Procedure: APPENDECTOMY LAPAROSCOPIC;  Surgeon: Michael Boston, MD;  Location: [facility];  Service: General;  Laterality: N/A;  . MASTECTOMY Right 02/15/2015   modified  . MODIFIED MASTECTOMY Right 02/15/2015   Procedure: RIGHT MODIFIED RADICAL MASTECTOMY;  Surgeon: Stark Klein, MD;  Location: [facility];  Service: General;  Laterality: Right;  . PORT-A-CATH REMOVAL Left 10/10/2015   Procedure: PORT REMOVAL;  Surgeon: Stark Klein, MD;  Location: [facility];  Service: General;  Laterality: Left;  . PORTACATH PLACEMENT Left 08/02/2014   Procedure: INSERTION PORT-A-CATH;  Surgeon: Stark Klein, MD;  Location: [place];  Service: General;  Laterality: Left;  . PORTACATH PLACEMENT N/A 11/05/2016   Procedure: INSERTION PORT-A-CATH;  Surgeon: Stark Klein, MD;  Location: [facility];  Service: General;  Laterality: N/A;  . ROTATOR CUFF REPAIR Right 2005  . SCAR REVISION Right 12/08/2015   Procedure: COMPLEX REPAIR RIGHT CHEST 10-15CM;   Surgeon: [name], MD;  Location: [facility];  Service: Plastics;  Laterality: Right;  . TOE SURGERY  1970s   bone spur  . TRIGGER FINGER RELEASE     x3    FAMILY HISTORY Family History  Problem Relation Age of Onset  . CVA Mother   . Heart disease Mother   . Sudden death Mother   . Diabetes Father   . Heart failure Father   . Hypertension Father   . Kidney disease Father   . Obesity Father   . Hypertension Sister   . Diabetes Brother   . Breast cancer Paternal Grandmother   . Diabetes Paternal Uncle    The patient's father died from complications of diabetes at age 40. The patient's mother died at age 52 with a subarachnoid hemorrhage. The patient had one brother, one sister. The only breast cancer was the patient's paternal grandmother diagnosed in her 89s. There is no history of ovarian cancer in the family.  GYNECOLOGIC HISTORY:  Patient's last menstrual period was 10/22/2007.  menarche age 86, the patient is GX P0. She went through the change of life in 2011. She did not take hormone replacement.  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 Her husband [name] is a retired Visual merchandiser (he taught at [employer]).  [name] has 2 children from an earlier marriage, Rockey Situ who teaches in [place], and Sybol Morre,  who works at the [employer] in in [place]. He has 1 grand son, aged 66. The patient and her husband are not church attenders.    ADVANCED DIRECTIVES:  Not in place   HEALTH MAINTENANCE: Social History   Tobacco Use  . Smoking status: Never Smoker  . Smokeless tobacco: Never Used  Substance Use Topics  . Alcohol use: Yes    Alcohol/week: 0.0 oz    Comment: social  . Drug use: No     Colonoscopy:  May 2016  PAP:  Bone density: 07/01/2016 at [facility] showed normal, with a T score of  -0.7  Lipid panel:  Allergies  Allergen Reactions  . Nsaids Other (See Comments)    H/o gastric bypass - avoid NSAIDs  . Tape Hives and Rash    Adhesives in bandaids    Current Outpatient Medications  Medication Sig Dispense Refill  . acetaminophen (TYLENOL) 325 MG tablet Take 650 mg by mouth 2 (two) times daily as needed for moderate pain or headache. Reported on 04/17/2015    . buPROPion (WELLBUTRIN SR) 200 MG 12 hr tablet Take 1 tablet (200 mg total) by mouth daily at 12 noon. 30 tablet 0  . calcium citrate-vitamin D (CITRACAL+D) 315-200 MG-UNIT tablet Take 1 tablet  by mouth 3 (three) times daily.    . cholecalciferol (VITAMIN D) 1000 units tablet Take 1,000 Units by mouth daily.    . diphenhydrAMINE (BENADRYL) 25 MG tablet Take 25 mg by mouth daily as needed for allergies or sleep. Reported on 03/06/2015    . glucose blood (ONETOUCH VERIO) test strip 1 each by Other route as needed for other. Reported on 03/06/2015    . hydrochlorothiazide (HYDRODIURIL) 25 MG tablet TAKE 1 TABLET BY MOUTH EVERY DAY 30 tablet 11  . Insulin Glargine (BASAGLAR KWIKPEN) 100 UNIT/ML SOPN Inject 0.16 mLs (16 Units total) into the skin at bedtime. (Patient taking differently: Inject 12 Units into the skin at bedtime. ) 2 pen 0  . Insulin Pen Needle (BD PEN NEEDLE NANO U/F) 32G X 4 MM MISC 1 Package by Does not apply route daily at 12 noon. 100 each 0  . liraglutide (VICTOZA) 18 MG/3ML SOPN Inject 0.3 mLs (1.8 mg total) into the skin every morning. 3 pen 0  . losartan (COZAAR) 100 MG tablet Take 1 tablet by mouth daily. Reported on 03/06/2015    . Melatonin 5 MG TABS Take 5 mg by mouth at bedtime. Reported on 03/06/2015    . metFORMIN (GLUCOPHAGE) 500 MG tablet Take 1 tablet (500 mg total) by mouth daily. 30 tablet 0  . mometasone (NASONEX) 50 MCG/ACT nasal spray Place 1-2 sprays into the nose daily as needed (for allergies.).     [name] ONETOUCH DELICA LANCETS 51Z MISC 1 Package by Does not apply route daily. 100 each  0  . Pediatric Multivitamins-Iron (FLINTSTONES PLUS IRON) chewable tablet Chew 1 tablet by mouth 2 (two) times daily.    . Probiotic Product ([employer] PROBIOTIC PO) Take 1 capsule by mouth daily. Reported on 03/06/2015    . Propylene Glycol (SYSTANE BALANCE OP) Apply 1-2 drops to eye 3 (three) times daily.    [name] senna-docusate (SENNA PLUS) 8.6-50 MG tablet Take 2 tablets by mouth at bedtime.    . topiramate (TOPAMAX) 25 MG tablet Take 1 tablet (25 mg total) by mouth daily. 30 tablet 0  . vortioxetine HBr (TRINTELLIX) 20 MG TABS Take 20 mg by mouth at bedtime. 30 tablet 0   No current facility-administered medications for this visit.     OBJECTIVE: Relation white woman who appears stated age   Vitals:   07/15/17 0837  BP: (!) 114/49  Pulse: 67  Resp: 18  Temp: 97.9 F (36.6 C)  SpO2: 100%     Body mass index is 30.13 kg/m.    ECOG FS:1 - Symptomatic but completely ambulatory Filed Weights   07/15/17 0837  Weight: 204 lb (92.5 kg)   Sclerae unicteric, EOMs intact Oropharynx clear and moist No cervical or supraclavicular adenopathy Lungs no rales or rhonchi Heart regular rate and rhythm Abd soft, nontender, positive bowel sounds MSK no focal spinal tenderness, no upper extremity lymphedema Neuro: nonfocal, well oriented, anxious and depressed affect Breasts: Status post right mastectomy.  There is no evidence of chest wall recurrence.  The left breast is unremarkable.  Both axillae are benign.  LAB RESULTS:  CMP     Component Value Date/Time   NA 143 06/17/2017 0833   NA 143 03/11/2017 0841   NA 140 01/07/2017 0758   K 3.7 06/17/2017 0833   K 4.1 01/07/2017 0758   CL 109 06/17/2017 0833   CO2 22 06/17/2017 0833   CO2 24 01/07/2017 0758   GLUCOSE 126 (H) 06/17/2017 0833   GLUCOSE  134 01/07/2017 0758   BUN 44 (H) 06/17/2017 0833   BUN 40 (H) 03/11/2017 0841   BUN 33.6 (H) 01/07/2017 0758   CREATININE 1.89 (H) 06/17/2017 0833   CREATININE 1.62 (H) 05/20/2017 0813    CREATININE 1.5 (H) 01/07/2017 0758   CALCIUM 9.2 06/17/2017 0833   CALCIUM 9.3 01/07/2017 0758   PROT 7.1 06/17/2017 0833   PROT 6.9 03/11/2017 0841   PROT 6.7 01/07/2017 0758   ALBUMIN 4.0 06/17/2017 0833   ALBUMIN 4.5 03/11/2017 0841   ALBUMIN 3.6 01/07/2017 0758   AST 32 06/17/2017 0833   AST 41 (H) 05/20/2017 0813   AST 51 (H) 01/07/2017 0758   ALT 40 06/17/2017 0833   ALT 59 (H) 05/20/2017 0813   ALT 82 (H) 01/07/2017 0758   ALKPHOS 83 06/17/2017 0833   ALKPHOS 115 01/07/2017 0758   BILITOT 0.6 06/17/2017 0833   BILITOT 0.6 05/20/2017 0813   BILITOT 0.72 01/07/2017 0758   GFRNONAA 27 (L) 06/17/2017 0833   GFRNONAA 33 (L) 05/20/2017 0813   GFRNONAA 55 (L) 02/18/2013 0827   GFRAA 31 (L) 06/17/2017 0833   GFRAA 38 (L) 05/20/2017 0813   GFRAA 63 02/18/2013 0827    INo results found for: SPEP, UPEP  Lab Results  Component Value Date   WBC 4.1 07/15/2017   NEUTROABS 2.8 07/15/2017   HGB 11.0 (L) 07/15/2017   HCT 32.1 (L) 07/15/2017   MCV 93.4 07/15/2017   PLT 152 07/15/2017      Chemistry      Component Value Date/Time   NA 143 06/17/2017 0833   NA 143 03/11/2017 0841   NA 140 01/07/2017 0758   K 3.7 06/17/2017 0833   K 4.1 01/07/2017 0758   CL 109 06/17/2017 0833   CO2 22 06/17/2017 0833   CO2 24 01/07/2017 0758   BUN 44 (H) 06/17/2017 0833   BUN 40 (H) 03/11/2017 0841   BUN 33.6 (H) 01/07/2017 0758   CREATININE 1.89 (H) 06/17/2017 0833   CREATININE 1.62 (H) 05/20/2017 0813   CREATININE 1.5 (H) 01/07/2017 0758      Component Value Date/Time   CALCIUM 9.2 06/17/2017 0833   CALCIUM 9.3 01/07/2017 0758   ALKPHOS 83 06/17/2017 0833   ALKPHOS 115 01/07/2017 0758   AST 32 06/17/2017 0833   AST 41 (H) 05/20/2017 0813   AST 51 (H) 01/07/2017 0758   ALT 40 06/17/2017 0833   ALT 59 (H) 05/20/2017 0813   ALT 82 (H) 01/07/2017 0758   BILITOT 0.6 06/17/2017 0833   BILITOT 0.6 05/20/2017 0813   BILITOT 0.72 01/07/2017 0758       No results found for:  LABCA2  No components found for: LABCA125  No results for input(s): INR in the last 168 hours.  Urinalysis    Component Value Date/Time   COLORURINE YELLOW 07/21/2015 [place] 07/21/2015 2328   LABSPEC 1.020 07/21/2015 2328   PHURINE 6.0 07/21/2015 2328   GLUCOSEU NEGATIVE 07/21/2015 2328   HGBUR TRACE (A) 07/21/2015 [place] 07/21/2015 2328   BILIRUBINUR neg 06/29/2013 [place] 07/21/2015 2328   PROTEINUR 30 (A) 07/21/2015 2328   UROBILINOGEN negative 06/29/2013 1017   NITRITE NEGATIVE 07/21/2015 2328   LEUKOCYTESUR NEGATIVE 07/21/2015 2328    STUDIES: Nm Pet Image Restag (ps) Skull Base To Thigh  Result Date: 07/10/2017 CLINICAL DATA:  Subsequent treatment strategy for RT BREAST CANCER. EXAM: NUCLEAR MEDICINE PET SKULL BASE TO THIGH TECHNIQUE:  10.0 mCi F-18 FDG was injected intravenously. Full-ring PET imaging was performed from the skull base to thigh after the radiotracer. CT data was obtained and used for attenuation correction and anatomic localization. Fasting blood glucose: 106 mg/dl COMPARISON:  01/16/2017. FINDINGS: Mediastinal blood pool activity: SUV max 3.01 NECK: No hypermetabolic lymph nodes in the neck. Incidental CT findings: none CHEST: Post treatment changes from right mastectomy and right axillary nodal dissection are again noted. Resolution of hypermetabolism associated with previous right subpectoral lymph node. No hypermetabolic mediastinal or hilar lymph nodes. No hypermetabolic pulmonary nodules. Incidental CT findings: Aortic atherosclerosis. Calcifications in the RCA, LAD and left circumflex coronary arteries noted. ABDOMEN/PELVIS: Within segment 7 of the liver there is a new focus of hypermetabolism which measures approximately 1.8 cm and has an SUV max of 9.88. Suspicious for metastatic disease. No abnormal uptake identified within the pancreas or spleen. The adrenal glands are unremarkable. No hypermetabolic  lymph nodes identified within the abdomen or pelvis. No inguinal adenopathy. Incidental CT findings: Aortic atherosclerosis with branch vessel disease. No aneurysm. Gallstones identified. Postoperative changes from gastric bypass surgery identified. Stone within the inferior pole of the left kidney is noted measuring 5 mm. Calcified uterine fibroid noted. SKELETON: No focal hypermetabolic activity to suggest skeletal metastasis. Incidental CT findings: none IMPRESSION: 1. New hypermetabolic lesion is identified within the right lobe of liver, suspicious for metastatic disease. 2. Incidental findings including aortic atherosclerosis and 3 vessel coronary artery atherosclerotic calcifications, gallstones, and kidney stones. 3.  Aortic Atherosclerosis (ICD10-I70.0). Electronically Signed   By: Kerby Moors M.D.   On: 07/10/2017 11:50     ASSESSMENT: 67 y.o. [place] woman status post right breast upper outer quadrant and right axillary lymph node biopsy 07/19/2014 for a cT2  pN1, stage IIB  invasive ductal carcinoma, grade 2, estrogen and progesterone receptor negative, with an MIB-1 of 90%, and HER-2 amplified  (1) neoadjuvant chemotherapy started 08/08/2014, consisting of carboplatin, docetaxel, trastuzumab and pertuzumab given every 3 weeks 6, completed 11/21/2014  (a) breast MRI after initial 3 cycles shows a significant response  (b) breast MRI after 6 cycles shows a complete radiologic response   (2) trastuzumab continued through 10/02/2015 (to end of capecitabine therapy)  (a) echo 02/13/2015 shows an EF of 60-65%  (b)  echo 05/09/2015 shows an ejection fraction of 60-65%  (c) echocardiogram 06/27/2015 shows an ejection fraction of 55-60%.  (d) echocardiogram 01/22/2017 shows an ejection fraction of 55-60%  (3) right lumpectomy with sentinel lymph node biopsy on 12/27/14 showed a residual  ypT2 ypN1a, stage IIB invasive ductal carcinoma, grade 2, with repeat estrogen and progesterone  receptors again negative  (4) status post right oncoplastic surgery (with left reduction mammoplasty) 01/06/2015 showing in the right breast multiple foci of intralymphatic carcinoma in random sections  (5)  Right modified radical mastectomy 02/15/2014 showed no residual invasive disease; there was DCIS but margins were negative; all 15 axillary lymph nodes were clear; ypTis ypN0  (a)  The patient has decided against reconstruction.  (6)  Postmastectomy radiation completed 05/18/2015 with capecitabine sensitization  (7) adjuvant capecitabine continued through 09/27/2015  (8) hepatic steatosis with increased fibrosis score (at risk for cirrhosis)  (9) thyroid biopsy (left lobe inferiorly) 2 shows benign tissue 06/28/2015  (10) LOCAL RECURRENCE:  (a) PET scan 12/06/2015 Notes a right retropectoral lymph node measuring 0.9 cm with an SUV max of 5.5  (b) repeat PET scan 03/11/2016 finds the right subpectoral lymph node to now measure 1.3 cm with an  SUV max of 11.8.  (c) PET scan 05/07/2016 shows a significant response to T-DM 1  (d) PET scan 07/10/2017 is clear except for what appears to be a single new liver lesion   (11) started T-DM1 03/21/2016, completed 8 doses 08/13/2016  (a) PET scan 08/19/2016 shows a good response but measurable residual disease  (b) PET scan on 01/16/2017 shows a continuing response  (12) maintenance trastuzumab started 09/03/2016--changed to every 28 days as of 01/28/2017  (a) repeat echocardiogram 07/08/2016 shows an ejection fraction of 60-65%.  (b) echocardiogram on 10/08/2016 demonstrates LVEF of 55-60% (followed by Dr. Haroldine Laws)  (c) echo 01/22/2017 shows an ejection fraction in the 55-60% range  (d) echo 04/24/2017 shows an ejection fraction in the 55-60% range  (13)  radiation to the area of persistent disease in the right upper chest completed 10/30/2016  PLAN: Darlene [name] is tolerating the trastuzumab well.  Her recent PET scan shows earlier areas of  concern not to be currently apparent.  However there is a new well-defined very hot liver spot, of concern for a new metastatic deposits.  We discussed this at length today.  It is difficult for her to focus on her cancer when she has all these debt issues to deal with.  Nevertheless we do need to proceed to biopsy of this area.  This will not only confirm that it is cancer, and assuming that is what it is, but will tell us whether it is still her to amplified or not.  In addition we will obtain a PD-L1 and a foundation one on this tissue, again assuming that we are dealing with metastatic breast cancer.  She may be a candidate for pembrolizumab.  Even though it is very difficult for her right now I did suggest she try to exercise some every week.  I am hopeful she will be able to resolve the financial issue so that she can concentrate on optimizing her health and fighting her cancer  She knows to call for any other issues that may develop before the next visit.  Magrinat, Virgie Dad, MD  07/15/17 9:05 AM Medical Oncology and Hematology [facility] [address] [place], [place] [address] Tel. [phone]    Fax. [phone]  Alice Rieger, am acting as scribe for Chauncey Cruel MD.  I, Lurline Del MD, have reviewed the above documentation for accuracy and completeness, and I agree with the above.

## 2017-07-14 NOTE — Progress Notes (Signed)
Received message from [facility] regarding patient with billing dispute.  Called patient and left a voicemail with my contact name and number and also advised she may contact billing at the nu,ber on the bill.

## 2017-07-14 NOTE — Telephone Encounter (Signed)
Sent the patient a my chart message. April, [place]

## 2017-07-14 NOTE — Telephone Encounter (Signed)
Patient states her [facility] on [address] called stating we did not approve her refill request for Metformin.  Please advise patient. Thank you Maudie Mercury

## 2017-07-15 ENCOUNTER — Inpatient Hospital Stay: Payer: BC Managed Care – PPO

## 2017-07-15 ENCOUNTER — Inpatient Hospital Stay: Payer: BC Managed Care – PPO | Attending: Oncology

## 2017-07-15 ENCOUNTER — Inpatient Hospital Stay (HOSPITAL_BASED_OUTPATIENT_CLINIC_OR_DEPARTMENT_OTHER): Payer: BC Managed Care – PPO | Admitting: Oncology

## 2017-07-15 VITALS — BP 114/49 | HR 67 | Temp 97.9°F | Resp 18 | Ht 69.0 in | Wt 204.0 lb

## 2017-07-15 DIAGNOSIS — Z171 Estrogen receptor negative status [ER-]: Secondary | ICD-10-CM

## 2017-07-15 DIAGNOSIS — C50411 Malignant neoplasm of upper-outer quadrant of right female breast: Secondary | ICD-10-CM

## 2017-07-15 DIAGNOSIS — Z5112 Encounter for antineoplastic immunotherapy: Secondary | ICD-10-CM | POA: Diagnosis not present

## 2017-07-15 DIAGNOSIS — C50911 Malignant neoplasm of unspecified site of right female breast: Secondary | ICD-10-CM

## 2017-07-15 DIAGNOSIS — Z95828 Presence of other vascular implants and grafts: Secondary | ICD-10-CM

## 2017-07-15 LAB — CBC WITH DIFFERENTIAL (CANCER CENTER ONLY)
Basophils Absolute: 0 10*3/uL (ref 0.0–0.1)
Basophils Relative: 1 %
Eosinophils Absolute: 0.1 10*3/uL (ref 0.0–0.5)
Eosinophils Relative: 2 %
HCT: 32.1 % — ABNORMAL LOW (ref 34.8–46.6)
Hemoglobin: 11 g/dL — ABNORMAL LOW (ref 11.6–15.9)
Lymphocytes Relative: 23 %
Lymphs Abs: 0.9 10*3/uL (ref 0.9–3.3)
MCH: 32.1 pg (ref 25.1–34.0)
MCHC: 34.3 g/dL (ref 31.5–36.0)
MCV: 93.4 fL (ref 79.5–101.0)
Monocytes Absolute: 0.3 10*3/uL (ref 0.1–0.9)
Monocytes Relative: 7 %
Neutro Abs: 2.8 10*3/uL (ref 1.5–6.5)
Neutrophils Relative %: 67 %
Platelet Count: 152 10*3/uL (ref 145–400)
RBC: 3.43 MIL/uL — ABNORMAL LOW (ref 3.70–5.45)
RDW: 13.8 % (ref 11.2–14.5)
WBC Count: 4.1 10*3/uL (ref 3.9–10.3)

## 2017-07-15 LAB — CMP (CANCER CENTER ONLY)
ALT: 70 U/L — ABNORMAL HIGH (ref 0–44)
AST: 41 U/L (ref 15–41)
Albumin: 4 g/dL (ref 3.5–5.0)
Alkaline Phosphatase: 92 U/L (ref 38–126)
Anion gap: 9 (ref 5–15)
BUN: 37 mg/dL — ABNORMAL HIGH (ref 8–23)
CO2: 22 mmol/L (ref 22–32)
Calcium: 9.5 mg/dL (ref 8.9–10.3)
Chloride: 110 mmol/L (ref 98–111)
Creatinine: 1.84 mg/dL — ABNORMAL HIGH (ref 0.44–1.00)
GFR, Est AFR Am: 32 mL/min — ABNORMAL LOW (ref >60)
GFR, Estimated: 28 mL/min — ABNORMAL LOW (ref >60)
Glucose, Bld: 136 mg/dL — ABNORMAL HIGH (ref 70–99)
Potassium: 4.1 mmol/L (ref 3.5–5.1)
Sodium: 141 mmol/L (ref 135–145)
Total Bilirubin: 0.7 mg/dL (ref 0.3–1.2)
Total Protein: 6.6 g/dL (ref 6.5–8.1)

## 2017-07-15 MED ORDER — TRASTUZUMAB CHEMO 150 MG IV SOLR
600.0000 mg | Freq: Once | INTRAVENOUS | Status: AC
  Administered 2017-07-15: 600 mg via INTRAVENOUS
  Filled 2017-07-15: qty 28.57

## 2017-07-15 MED ORDER — ACETAMINOPHEN 325 MG PO TABS
ORAL_TABLET | ORAL | Status: AC
Start: 2017-07-15 — End: ?
  Filled 2017-07-15: qty 2

## 2017-07-15 MED ORDER — ACETAMINOPHEN 325 MG PO TABS
650.0000 mg | ORAL_TABLET | Freq: Once | ORAL | Status: AC
  Administered 2017-07-15: 650 mg via ORAL

## 2017-07-15 MED ORDER — DIPHENHYDRAMINE HCL 12.5 MG/5ML PO ELIX
ORAL_SOLUTION | ORAL | Status: AC
  Filled 2017-07-15: qty 5

## 2017-07-15 MED ORDER — DIPHENHYDRAMINE HCL 12.5 MG/5ML PO ELIX
12.5000 mg | ORAL_SOLUTION | Freq: Once | ORAL | Status: AC
  Administered 2017-07-15: 12.5 mg via ORAL

## 2017-07-15 MED ORDER — SODIUM CHLORIDE 0.9 % IJ SOLN
10.0000 mL | INTRAMUSCULAR | Status: DC | PRN
  Administered 2017-07-15: 10 mL via INTRAVENOUS
  Filled 2017-07-15: qty 10

## 2017-07-15 MED ORDER — SODIUM CHLORIDE 0.9% FLUSH
10.0000 mL | INTRAVENOUS | Status: DC | PRN
  Administered 2017-07-15: 10 mL
  Filled 2017-07-15: qty 10

## 2017-07-15 MED ORDER — HEPARIN SOD (PORK) LOCK FLUSH 100 UNIT/ML IV SOLN
500.0000 [IU] | Freq: Once | INTRAVENOUS | Status: AC | PRN
  Administered 2017-07-15: 500 [IU]
  Filled 2017-07-15: qty 5

## 2017-07-15 MED ORDER — SODIUM CHLORIDE 0.9 % IV SOLN
Freq: Once | INTRAVENOUS | Status: AC
  Administered 2017-07-15: 10:00:00 via INTRAVENOUS

## 2017-07-15 NOTE — Patient Instructions (Signed)
[facility] Discharge Instructions for Patients Receiving Chemotherapy  Today you received the following chemotherapy agents Herceptin  To help prevent nausea and vomiting after your treatment, we encourage you to take your nausea medication as directed   If you develop nausea and vomiting that is not controlled by your nausea medication, call the clinic.   BELOW ARE SYMPTOMS THAT SHOULD BE REPORTED IMMEDIATELY:  *FEVER GREATER THAN 100.5 F  *CHILLS WITH OR WITHOUT FEVER  NAUSEA AND VOMITING THAT IS NOT CONTROLLED WITH YOUR NAUSEA MEDICATION  *UNUSUAL SHORTNESS OF BREATH  *UNUSUAL BRUISING OR BLEEDING  TENDERNESS IN MOUTH AND THROAT WITH OR WITHOUT PRESENCE OF ULCERS  *URINARY PROBLEMS  *BOWEL PROBLEMS  UNUSUAL RASH Items with * indicate a potential emergency and should be followed up as soon as possible.  Feel free to call the clinic should you have any questions or concerns. The clinic phone number is [phone].  Please show the CHEMO ALERT CARD at check-in to the Emergency Department and triage nurse.   

## 2017-07-16 ENCOUNTER — Other Ambulatory Visit (HOSPITAL_COMMUNITY)
Admission: RE | Admit: 2017-07-16 | Discharge: 2017-07-16 | Disposition: A | Discharge status: Home / Self Care | Payer: BC Managed Care – PPO | Source: Ambulatory Visit | Attending: Certified Nurse Midwife | Admitting: Certified Nurse Midwife

## 2017-07-16 ENCOUNTER — Encounter: Payer: Self-pay | Admitting: Certified Nurse Midwife

## 2017-07-16 ENCOUNTER — Ambulatory Visit: Payer: Commercial Managed Care - PPO | Admitting: Nurse Practitioner

## 2017-07-16 ENCOUNTER — Ambulatory Visit (INDEPENDENT_AMBULATORY_CARE_PROVIDER_SITE_OTHER): Payer: BC Managed Care – PPO | Admitting: Certified Nurse Midwife

## 2017-07-16 ENCOUNTER — Other Ambulatory Visit (INDEPENDENT_AMBULATORY_CARE_PROVIDER_SITE_OTHER): Payer: Self-pay

## 2017-07-16 ENCOUNTER — Other Ambulatory Visit: Payer: Self-pay

## 2017-07-16 VITALS — BP 114/60 | HR 68 | Resp 16 | Ht 68.75 in | Wt 204.0 lb

## 2017-07-16 DIAGNOSIS — Z853 Personal history of malignant neoplasm of breast: Secondary | ICD-10-CM

## 2017-07-16 DIAGNOSIS — Z124 Encounter for screening for malignant neoplasm of cervix: Secondary | ICD-10-CM | POA: Insufficient documentation

## 2017-07-16 DIAGNOSIS — Z01419 Encounter for gynecological examination (general) (routine) without abnormal findings: Secondary | ICD-10-CM | POA: Diagnosis not present

## 2017-07-16 DIAGNOSIS — F3289 Other specified depressive episodes: Secondary | ICD-10-CM

## 2017-07-16 MED ORDER — TOPIRAMATE 25 MG PO TABS: 25.0000 mg | ORAL_TABLET | Freq: Every day | ORAL | 0 refills | Status: DC

## 2017-07-16 NOTE — Progress Notes (Signed)
67 y.o. [id number] Married  Caucasian Fe here for annual exam. Menopausal no HRT. Denies vaginal bleeding . Sexually active rarely with spouse with ED. Some vaginal dryness, not using anything, does not need to at this point. Sees Dr. Jacelyn Grip PCP and weight loss clinic for medication management of Type 2 Diabetes, hypertension.Had weight loss surgery 4 years ago and has maintained fairly well. Last mammogram was normal, but pet scan indicated spot on liver as possible metastasis of breast cancer there. She will be having liver biopsy soon. Trying not to be so concerned about and enjoying life. Recent trip to [place] and [place] for bird watching! No other health concerns today.  Patient's last menstrual period was 10/22/2007.          Sexually active: Yes.    The current method of family planning is vasectomy.    Exercising: No.  exercise Smoker:  no  Health Maintenance: Pap:  07-05-14 neg HPV HR neg History of Abnormal Pap: no MMG:  Normal 2019 hx of breast cancer  Self Breast exams: yes Colonoscopy:  2015 diverticulosis f/u 66yrs BMD:   2018 normal TDaP:  UTD with PCP Shingles: 2015 Pneumonia: 2012 Hep C and HIV: hep c neg 2017, HIV neg 2016 Labs: no   reports that she has never smoked. She has never used smokeless tobacco. She reports that she drinks about 0.6 oz of alcohol per week. She reports that she does not use drugs.  Past Medical History:  Diagnosis Date  . Anemia    hx of - during 1st round chemo  . Anxiety   . Arthritis    in fingers, shoulders  . Back pain   . Balance problem   . Breast cancer ([place])   . Breast cancer of upper-outer quadrant of right female breast ([place]) 07/22/2014  . Bunion, right foot   . Clumsiness   . Constipation   . Depression   . Diabetes mellitus without complication ([place])    88+ years  . Eating disorder    binge eating  . Epistaxis 08/26/2014  . Fatty liver   . HCAP (healthcare-associated pneumonia) 12/18/2014  . Headache   . Heart murmur     never had any problems  . History of radiation therapy 04/05/15-05/19/15   right chest wall was treated to 50.4 Gy in 28 fractions, right mastectomy scar was treated to 10 Gy in 5 fractions  . Hyperlipidemia   . Hypertension   . Joint pain   . Multiple food allergies   . Neuromuscular disorder ([place])    diabetic neuropathy  . Obesity   . Obstructive sleep apnea on CPAP    uses cpap nightly  . Ovarian cyst rupture    possible  . Pneumonia   . Pneumonia 11/2014  . Radiation 04/05/15-05/19/15   right chest wall 50.4 Gy, mastectomy scar 10 Gy  . Sleep apnea   . Thyroid nodule 06/2014    Past Surgical History:  Procedure Laterality Date  . APPENDECTOMY    . BIOPSY THYROID Bilateral 06/2014   2 nodules - Benign   . BREAST LUMPECTOMY WITH RADIOACTIVE SEED AND SENTINEL LYMPH NODE BIOPSY Right 12/27/2014   Procedure: BREAST LUMPECTOMY WITH RADIOACTIVE SEED AND SENTINEL LYMPH NODE BIOPSY;  Surgeon: Stark Klein, MD;  Location: [facility];  Service: General;  Laterality: Right;  . BREAST REDUCTION SURGERY Bilateral 01/06/2015   Procedure: BILATERAL BREAST REDUCTION, RIGHT ONCOPLASTIC RECONSTRUCTION),LEFT BREAST REDUCTION FOR SYMMETRY;  Surgeon: [name], MD;  Location: [facility]  OR;  Service: Clinical cytogeneticist;  Laterality: Bilateral;  . BREATH TEK H PYLORI N/A 09/15/2012   Procedure: BREATH TEK H PYLORI;  Surgeon: Pedro Earls, MD;  Location: [facility];  Service: General;  Laterality: N/A;  . BUNIONECTOMY Left 12/2013  . CARPAL TUNNEL RELEASE Right   . CARPAL TUNNEL RELEASE Left   . COLONOSCOPY    . DUPUYTREN CONTRACTURE RELEASE    . GASTRIC ROUX-EN-Y N/A 11/02/2012   Procedure: LAPAROSCOPIC ROUX-EN-Y GASTRIC;  Surgeon: Pedro Earls, MD;  Location: [facility];  Service: General;  Laterality: N/A;  . IR GENERIC HISTORICAL  03/18/2016   IR US GUIDE VASC ACCESS LEFT 03/18/2016 Markus Daft, MD [facility]  . IR GENERIC HISTORICAL  03/18/2016   IR FLUORO GUIDE CV LINE LEFT 03/18/2016  Markus Daft, MD [facility]  . LAPAROSCOPIC APPENDECTOMY N/A 07/22/2015   Procedure: APPENDECTOMY LAPAROSCOPIC;  Surgeon: Michael Boston, MD;  Location: [facility];  Service: General;  Laterality: N/A;  . MASTECTOMY Right 02/15/2015   modified  . MODIFIED MASTECTOMY Right 02/15/2015   Procedure: RIGHT MODIFIED RADICAL MASTECTOMY;  Surgeon: Stark Klein, MD;  Location: [facility];  Service: General;  Laterality: Right;  . PORT-A-CATH REMOVAL Left 10/10/2015   Procedure: PORT REMOVAL;  Surgeon: Stark Klein, MD;  Location: [facility];  Service: General;  Laterality: Left;  . PORTACATH PLACEMENT Left 08/02/2014   Procedure: INSERTION PORT-A-CATH;  Surgeon: Stark Klein, MD;  Location: [facility];  Service: General;  Laterality: Left;  . PORTACATH PLACEMENT N/A 11/05/2016   Procedure: INSERTION PORT-A-CATH;  Surgeon: Stark Klein, MD;  Location: [facility];  Service: General;  Laterality: N/A;  . ROTATOR CUFF REPAIR Right 2005  . SCAR REVISION Right 12/08/2015   Procedure: COMPLEX REPAIR RIGHT CHEST 10-15CM;  Surgeon: [name], MD;  Location: [facility];  Service: Plastics;  Laterality: Right;  . TOE SURGERY  1970s   bone spur  . TRIGGER FINGER RELEASE     x3    Current Outpatient Medications  Medication Sig Dispense Refill  . acetaminophen (TYLENOL) 325 MG tablet Take 650 mg by mouth 2 (two) times daily as needed for moderate pain or headache. Reported on 04/17/2015    . buPROPion (WELLBUTRIN SR) 200 MG 12 hr tablet Take 1 tablet (200 mg total) by mouth daily at 12 noon. 30 tablet 0  . calcium citrate-vitamin D (CITRACAL+D) 315-200 MG-UNIT tablet Take 1 tablet by mouth 3 (three) times daily.    . cholecalciferol (VITAMIN D) 1000 units tablet Take 1,000 Units by mouth daily.    . diphenhydrAMINE (BENADRYL) 25 MG tablet Take 25 mg by mouth daily as needed for allergies or sleep. Reported on 03/06/2015    . glucose blood (ONETOUCH VERIO) test strip 1 each by Other route as needed  for other. Reported on 03/06/2015    . hydrochlorothiazide (HYDRODIURIL) 25 MG tablet TAKE 1 TABLET BY MOUTH EVERY DAY 30 tablet 11  . Insulin Glargine (BASAGLAR KWIKPEN) 100 UNIT/ML SOPN Inject 0.16 mLs (16 Units total) into the skin at bedtime. (Patient taking differently: Inject 12 Units into the skin at bedtime. ) 2 pen 0  . Insulin Pen Needle (BD PEN NEEDLE NANO U/F) 32G X 4 MM MISC 1 Package by Does not apply route daily at 12 noon. 100 each 0  . liraglutide (VICTOZA) 18 MG/3ML SOPN Inject 0.3 mLs (1.8 mg total) into the skin every morning. 3 pen 0  . losartan (COZAAR) 100 MG tablet Take 1 tablet by  mouth daily. Reported on 03/06/2015    . Melatonin 5 MG TABS Take 5 mg by mouth at bedtime. Reported on 03/06/2015    . metFORMIN (GLUCOPHAGE) 500 MG tablet Take 1 tablet (500 mg total) by mouth daily. 30 tablet 0  . mometasone (NASONEX) 50 MCG/ACT nasal spray Place 1-2 sprays into the nose daily as needed (for allergies.).     [name] moxifloxacin (VIGAMOX) 0.5 % ophthalmic solution USE 1 DROP IN LEFT EYE 4 TIMES A DAY.START AFTER SURGERY  1  . ONETOUCH DELICA LANCETS 11B MISC 1 Package by Does not apply route daily. 100 each 0  . Pediatric Multivitamins-Iron (FLINTSTONES PLUS IRON) chewable tablet Chew 1 tablet by mouth 2 (two) times daily.    . prednisoLONE acetate (PRED FORTE) 1 % ophthalmic suspension START AFTER SURGERY,AS DIRECTED BY MD.ON THE DAY OF SURGERY  1  . Probiotic Product ([employer] PROBIOTIC PO) Take 1 capsule by mouth daily. Reported on 03/06/2015    . Propylene Glycol (SYSTANE BALANCE OP) Apply 1-2 drops to eye 3 (three) times daily.    [name] senna-docusate (SENNA PLUS) 8.6-50 MG tablet Take 2 tablets by mouth at bedtime.    . topiramate (TOPAMAX) 25 MG tablet Take 1 tablet (25 mg total) by mouth daily. 30 tablet 0  . vortioxetine HBr (TRINTELLIX) 20 MG TABS Take 20 mg by mouth at bedtime. 30 tablet 0   No current facility-administered medications for this visit.     Family History  Problem  Relation Age of Onset  . CVA Mother   . Heart disease Mother   . Sudden death Mother   . Diabetes Father   . Heart failure Father   . Hypertension Father   . Kidney disease Father   . Obesity Father   . Hypertension Sister   . Diabetes Brother   . Breast cancer Paternal Grandmother   . Diabetes Paternal Uncle     ROS:  Pertinent items are noted in HPI.  Otherwise, a comprehensive ROS was negative.  Exam:   BP 114/60   Pulse 68   Resp 16   Ht 5' 8.75" (1.746 m)   Wt 204 lb (92.5 kg)   LMP 10/22/2007   BMI 30.35 kg/m  Height: 5' 8.75" (174.6 cm) Ht Readings from Last 3 Encounters:  07/16/17 5' 8.75" (1.746 m)  07/15/17 5\' 9"  (1.753 m)  07/01/17 5\' 9"  (1.753 m)    General appearance: alert, cooperative and appears stated age Head: Normocephalic, without obvious abnormality, atraumatic Neck: no adenopathy, supple, symmetrical, trachea midline and thyroid normal to inspection and palpation Lungs: clear to auscultation bilaterally Breasts: normal appearance, no masses or tenderness, No nipple retraction or dimpling, No nipple discharge or bleeding, No axillary or supraclavicular adenopathy, surgical scarring bilateral from breast cancer on right and reduction on left Heart: regular rate and rhythm Abdomen: soft, non-tender; no masses,  no organomegaly Extremities: extremities normal, atraumatic, no cyanosis or edema Skin: Skin color, texture, turgor normal. No rashes or lesions Lymph nodes: Cervical, supraclavicular, and axillary nodes normal. No abnormal inguinal nodes palpated Neurologic: Grossly normal   Pelvic: External genitalia:  no lesions              Urethra:  normal appearing urethra with no masses, tenderness or lesions              Bartholin's and Skene's: normal                 Vagina: normal appearing vagina with normal  color and discharge, no lesions              Cervix: no bleeding following Pap, no cervical motion tenderness and no lesions               Pap taken: Yes.   Bimanual Exam:  Uterus:  normal size, contour, position, consistency, mobility, non-tender              Adnexa: normal adnexa and no mass, fullness, tenderness               Rectovaginal: Confirms               Anus:  normal sphincter tone, no lesions  Chaperone present: yes  A:  Well Woman with normal exam  Post menopausal no HRT  History of right breast cancer with possible liver metatasis , waiting to have biopsy  Hypertension/Type 2 Diabetes with MD management  P:   Reviewed health and wellness pertinent to exam  Aware of need to advise if vaginal bleeding  Continue follow up as indicated and mammograms yearly with [facility]  Continue follow up with MD as indicated  Pap smear: yes   counseled on breast self exam, mammography screening, feminine hygiene, adequate intake of calcium and vitamin D, diet and exercise  return annually or prn  An After Visit Summary was printed and given to the patient.

## 2017-07-16 NOTE — Patient Instructions (Signed)

## 2017-07-17 ENCOUNTER — Other Ambulatory Visit: Payer: Self-pay | Admitting: Oncology

## 2017-07-17 DIAGNOSIS — C50911 Malignant neoplasm of unspecified site of right female breast: Secondary | ICD-10-CM

## 2017-07-17 LAB — CYTOLOGY - PAP
Diagnosis: NEGATIVE
HPV: NOT DETECTED

## 2017-07-21 ENCOUNTER — Ambulatory Visit (HOSPITAL_COMMUNITY)
Admission: RE | Admit: 2017-07-21 | Discharge: 2017-07-21 | Disposition: A | Discharge status: Home / Self Care | Payer: BC Managed Care – PPO | Source: Ambulatory Visit | Attending: Oncology | Admitting: Oncology

## 2017-07-21 DIAGNOSIS — K8020 Calculus of gallbladder without cholecystitis without obstruction: Secondary | ICD-10-CM | POA: Diagnosis not present

## 2017-07-21 DIAGNOSIS — K769 Liver disease, unspecified: Secondary | ICD-10-CM | POA: Diagnosis not present

## 2017-07-21 DIAGNOSIS — C50911 Malignant neoplasm of unspecified site of right female breast: Secondary | ICD-10-CM | POA: Insufficient documentation

## 2017-07-28 ENCOUNTER — Encounter: Payer: Self-pay | Admitting: Oncology

## 2017-07-29 ENCOUNTER — Other Ambulatory Visit: Payer: Self-pay | Admitting: Student

## 2017-07-30 ENCOUNTER — Other Ambulatory Visit: Payer: Self-pay | Admitting: Oncology

## 2017-07-30 ENCOUNTER — Other Ambulatory Visit: Payer: Self-pay

## 2017-07-30 ENCOUNTER — Ambulatory Visit (HOSPITAL_COMMUNITY)
Admission: RE | Admit: 2017-07-30 | Discharge: 2017-07-30 | Disposition: A | Discharge status: Home / Self Care | Payer: BC Managed Care – PPO | Source: Ambulatory Visit | Attending: Oncology | Admitting: Oncology

## 2017-07-30 ENCOUNTER — Ambulatory Visit (INDEPENDENT_AMBULATORY_CARE_PROVIDER_SITE_OTHER): Payer: BC Managed Care – PPO | Admitting: Family Medicine

## 2017-07-30 VITALS — BP 123/66 | HR 67 | Resp 15 | Ht 69.0 in | Wt 199.0 lb

## 2017-07-30 DIAGNOSIS — C50911 Malignant neoplasm of unspecified site of right female breast: Secondary | ICD-10-CM

## 2017-07-30 DIAGNOSIS — E119 Type 2 diabetes mellitus without complications: Secondary | ICD-10-CM | POA: Diagnosis not present

## 2017-07-30 DIAGNOSIS — E669 Obesity, unspecified: Secondary | ICD-10-CM | POA: Diagnosis not present

## 2017-07-30 DIAGNOSIS — K769 Liver disease, unspecified: Secondary | ICD-10-CM | POA: Insufficient documentation

## 2017-07-30 DIAGNOSIS — F3289 Other specified depressive episodes: Secondary | ICD-10-CM

## 2017-07-30 DIAGNOSIS — Z853 Personal history of malignant neoplasm of breast: Secondary | ICD-10-CM | POA: Diagnosis not present

## 2017-07-30 DIAGNOSIS — Z171 Estrogen receptor negative status [ER-]: Secondary | ICD-10-CM

## 2017-07-30 DIAGNOSIS — F329 Major depressive disorder, single episode, unspecified: Secondary | ICD-10-CM | POA: Diagnosis not present

## 2017-07-30 DIAGNOSIS — Z538 Procedure and treatment not carried out for other reasons: Secondary | ICD-10-CM | POA: Insufficient documentation

## 2017-07-30 DIAGNOSIS — Z794 Long term (current) use of insulin: Secondary | ICD-10-CM

## 2017-07-30 DIAGNOSIS — F419 Anxiety disorder, unspecified: Secondary | ICD-10-CM | POA: Diagnosis not present

## 2017-07-30 DIAGNOSIS — Z79899 Other long term (current) drug therapy: Secondary | ICD-10-CM | POA: Diagnosis not present

## 2017-07-30 DIAGNOSIS — Z9221 Personal history of antineoplastic chemotherapy: Secondary | ICD-10-CM | POA: Diagnosis not present

## 2017-07-30 DIAGNOSIS — G4733 Obstructive sleep apnea (adult) (pediatric): Secondary | ICD-10-CM | POA: Insufficient documentation

## 2017-07-30 DIAGNOSIS — E1140 Type 2 diabetes mellitus with diabetic neuropathy, unspecified: Secondary | ICD-10-CM | POA: Diagnosis not present

## 2017-07-30 DIAGNOSIS — Z9011 Acquired absence of right breast and nipple: Secondary | ICD-10-CM | POA: Insufficient documentation

## 2017-07-30 DIAGNOSIS — E785 Hyperlipidemia, unspecified: Secondary | ICD-10-CM | POA: Diagnosis not present

## 2017-07-30 DIAGNOSIS — I10 Essential (primary) hypertension: Secondary | ICD-10-CM | POA: Diagnosis not present

## 2017-07-30 DIAGNOSIS — C50411 Malignant neoplasm of upper-outer quadrant of right female breast: Secondary | ICD-10-CM

## 2017-07-30 LAB — CBC
HCT: 33.7 % — ABNORMAL LOW (ref 36.0–46.0)
Hemoglobin: 11.2 g/dL — ABNORMAL LOW (ref 12.0–15.0)
MCH: 31.3 pg (ref 26.0–34.0)
MCHC: 33.2 g/dL (ref 30.0–36.0)
MCV: 94.1 fL (ref 78.0–100.0)
Platelets: 154 10*3/uL (ref 150–400)
RBC: 3.58 MIL/uL — ABNORMAL LOW (ref 3.87–5.11)
RDW: 13 % (ref 11.5–15.5)
WBC: 5.4 10*3/uL (ref 4.0–10.5)

## 2017-07-30 LAB — APTT: aPTT: 32 seconds (ref 24–36)

## 2017-07-30 LAB — PROTIME-INR
INR: 1.12
Prothrombin Time: 14.3 seconds (ref 11.4–15.2)

## 2017-07-30 MED ORDER — LIDOCAINE HCL (PF) 1 % IJ SOLN
INTRAMUSCULAR | Status: AC
  Filled 2017-07-30: qty 10

## 2017-07-30 MED ORDER — FENTANYL CITRATE (PF) 100 MCG/2ML IJ SOLN
INTRAMUSCULAR | Status: AC
  Filled 2017-07-30: qty 2

## 2017-07-30 MED ORDER — TOPIRAMATE 25 MG PO TABS: 25.0000 mg | ORAL_TABLET | Freq: Every day | ORAL | 0 refills | Status: DC

## 2017-07-30 MED ORDER — MIDAZOLAM HCL 2 MG/2ML IJ SOLN
INTRAMUSCULAR | Status: AC
  Filled 2017-07-30: qty 2

## 2017-07-30 MED ORDER — BUPROPION HCL ER (SR) 200 MG PO TB12: 200.0000 mg | ORAL_TABLET | Freq: Every day | ORAL | 0 refills | Status: DC

## 2017-07-30 MED ORDER — METFORMIN HCL 500 MG PO TABS: 500.0000 mg | ORAL_TABLET | Freq: Every day | ORAL | 0 refills | Status: DC

## 2017-07-30 MED ORDER — LIRAGLUTIDE 18 MG/3ML ~~LOC~~ SOPN: 1.8000 mg | PEN_INJECTOR | SUBCUTANEOUS | 0 refills | Status: DC

## 2017-07-30 MED ORDER — SODIUM CHLORIDE 0.9 % IV SOLN: INTRAVENOUS | Status: DC

## 2017-07-30 NOTE — Progress Notes (Signed)
Office: [phone]  /  Fax: [phone]   HPI:   Chief Complaint: OBESITY Darlene [name] is here to discuss her progress with her obesity treatment plan. She is on the keep a food journal with 1300 to 1500 calories and 80+ grams of protein daily and is following her eating plan approximately 0 % of the time. She states she is exercising 0 minutes 0 times per week. Darlene [name] hasn't been following her plan closely due to recent health news, that her breast cancer has possibly metastasized. She notes increased emotional eating and her mood is depressed. Her weight is 199 lb (90.3 kg) today and has had a weight loss of 1 pound over a period of 4 weeks since her last visit. She has lost 26 lbs since starting treatment with Darlene [name].  Diabetes II Darlene [name] has a diagnosis of diabetes type II, very well controlled on Basaglar 12 units, Victoza and Metformin. Darlene [name] states fasting BGs mostly range between 80 and 100 and she denies any hypoglycemic episodes. Last A1c was at 6.1 She has been working on intensive lifestyle modifications including diet, exercise, and weight loss to help control her blood glucose levels  Depression (with normal grieving reaction) Darlene [name] notes depressed mood with possible breast cancer mets to liver. Biopsy will be done today. Patient is tearful in the office today as is expected. She struggles with emotional eating and using food for comfort to the extent that it is negatively impacting her health. She often snacks when she is not hungry. Darlene [name] sometimes feels she is out of control and then feels guilty that she made poor food choices. She has been working on behavior modification techniques to help reduce her emotional eating and has been somewhat successful. She shows no sign of suicidal or homicidal ideations.  Depression screen [facility] 2/9 09/12/2016 08/09/2016 01/01/2016 11/15/2015 10/12/2015  Decreased Interest 0 1 0 0 0  Down, Depressed, Hopeless 0 1 0 0 0  PHQ - 2 Score 0 2 0  0 0  Altered sleeping - 0 - - -  Tired, decreased energy - 1 - - -  Change in appetite - 2 - - -  Feeling bad or failure about yourself  - 2 - - -  Trouble concentrating - 1 - - -  Moving slowly or fidgety/restless - 1 - - -  Suicidal thoughts - 0 - - -  PHQ-9 Score - 9 - - -  Some recent data might be hidden    ALLERGIES: Allergies  Allergen Reactions  . Nsaids Other (See Comments)    H/o gastric bypass - avoid NSAIDs  . Tape Hives, Rash and Other (See Comments)    Adhesives in bandaids    MEDICATIONS: Current Outpatient Medications on File Prior to Visit  Medication Sig Dispense Refill  . acetaminophen (TYLENOL) 325 MG tablet Take 650 mg by mouth 2 (two) times daily as needed for moderate pain or headache.     [name] buPROPion (WELLBUTRIN SR) 200 MG 12 hr tablet Take 1 tablet (200 mg total) by mouth daily at 12 noon. 30 tablet 0  . CALCIUM CITRATE-VITAMIN D PO Take 1 tablet by mouth 3 (three) times daily.     . cholecalciferol (VITAMIN D) 1000 units tablet Take 1,000 Units by mouth daily.    . diphenhydrAMINE (BENADRYL) 25 MG tablet Take 25 mg by mouth daily as needed for allergies or sleep.     . hydrochlorothiazide (HYDRODIURIL) 25 MG tablet TAKE 1 TABLET BY MOUTH EVERY DAY 30  tablet 11  . Insulin Glargine (BASAGLAR KWIKPEN) 100 UNIT/ML SOPN Inject 0.16 mLs (16 Units total) into the skin at bedtime. (Patient taking differently: Inject 12 Units into the skin daily. ) 2 pen 0  . Insulin Pen Needle (BD PEN NEEDLE NANO U/F) 32G X 4 MM MISC 1 Package by Does not apply route daily at 12 noon. 100 each 0  . liraglutide (VICTOZA) 18 MG/3ML SOPN Inject 0.3 mLs (1.8 mg total) into the skin every morning. 3 pen 0  . losartan (COZAAR) 100 MG tablet Take 100 mg by mouth daily.     . Melatonin 3 MG CAPS Take 3 mg by mouth at bedtime.     . metFORMIN (GLUCOPHAGE) 500 MG tablet Take 1 tablet (500 mg total) by mouth daily. 30 tablet 0  . ONETOUCH DELICA LANCETS 54M MISC 1 Package by Does not apply  route daily. 100 each 0  . Pediatric Multivitamins-Iron (FLINTSTONES PLUS IRON) chewable tablet Chew 1 tablet by mouth 2 (two) times daily.    . Probiotic Product ([employer] PROBIOTIC PO) Take 1 capsule by mouth daily.     [name] Propylene Glycol (SYSTANE BALANCE OP) Place 1-2 drops into both eyes 3 (three) times daily as needed (for dry eyes).     [name] senna-docusate (SENNA PLUS) 8.6-50 MG tablet Take 1 tablet by mouth daily as needed for moderate constipation.     . topiramate (TOPAMAX) 25 MG tablet Take 1 tablet (25 mg total) by mouth daily. 6 tablet 0  . vortioxetine HBr (TRINTELLIX) 20 MG TABS Take 20 mg by mouth at bedtime. 30 tablet 0   No current facility-administered medications on file prior to visit.     PAST MEDICAL HISTORY: Past Medical History:  Diagnosis Date  . Anemia    hx of - during 1st round chemo  . Anxiety   . Arthritis    in fingers, shoulders  . Back pain   . Balance problem   . Breast cancer ([place])   . Breast cancer of upper-outer quadrant of right female breast ([place]) 07/22/2014  . Bunion, right foot   . Clumsiness   . Constipation   . Depression   . Diabetes mellitus without complication ([place])    08+ years  . Eating disorder    binge eating  . Epistaxis 08/26/2014  . Fatty liver   . HCAP (healthcare-associated pneumonia) 12/18/2014  . Headache   . Heart murmur    never had any problems  . History of radiation therapy 04/05/15-05/19/15   right chest wall was treated to 50.4 Gy in 28 fractions, right mastectomy scar was treated to 10 Gy in 5 fractions  . Hyperlipidemia   . Hypertension   . Joint pain   . Multiple food allergies   . Neuromuscular disorder ([place])    diabetic neuropathy  . Obesity   . Obstructive sleep apnea on CPAP    uses cpap nightly  . Ovarian cyst rupture    possible  . Pneumonia   . Pneumonia 11/2014  . Radiation 04/05/15-05/19/15   right chest wall 50.4 Gy, mastectomy scar 10 Gy  . Sleep apnea   . Thyroid nodule 06/2014    PAST SURGICAL  HISTORY: Past Surgical History:  Procedure Laterality Date  . APPENDECTOMY    . BIOPSY THYROID Bilateral 06/2014   2 nodules - Benign   . BREAST LUMPECTOMY WITH RADIOACTIVE SEED AND SENTINEL LYMPH NODE BIOPSY Right 12/27/2014   Procedure: BREAST LUMPECTOMY WITH RADIOACTIVE SEED AND SENTINEL LYMPH  NODE BIOPSY;  Surgeon: Stark Klein, MD;  Location: [facility];  Service: General;  Laterality: Right;  . BREAST REDUCTION SURGERY Bilateral 01/06/2015   Procedure: BILATERAL BREAST REDUCTION, RIGHT ONCOPLASTIC RECONSTRUCTION),LEFT BREAST REDUCTION FOR SYMMETRY;  Surgeon: [name], MD;  Location: [facility];  Service: Plastics;  Laterality: Bilateral;  . BREATH TEK H PYLORI N/A 09/15/2012   Procedure: BREATH TEK H PYLORI;  Surgeon: Pedro Earls, MD;  Location: [facility];  Service: General;  Laterality: N/A;  . BUNIONECTOMY Left 12/2013  . CARPAL TUNNEL RELEASE Right   . CARPAL TUNNEL RELEASE Left   . CATARACT EXTRACTION     6/19  . COLONOSCOPY    . DUPUYTREN CONTRACTURE RELEASE    . GASTRIC ROUX-EN-Y N/A 11/02/2012   Procedure: LAPAROSCOPIC ROUX-EN-Y GASTRIC;  Surgeon: Pedro Earls, MD;  Location: [facility];  Service: General;  Laterality: N/A;  . IR GENERIC HISTORICAL  03/18/2016   IR Darlene [name] GUIDE VASC ACCESS LEFT 03/18/2016 Markus Daft, MD [facility]  . IR GENERIC HISTORICAL  03/18/2016   IR FLUORO GUIDE CV LINE LEFT 03/18/2016 Markus Daft, MD [facility]  . LAPAROSCOPIC APPENDECTOMY N/A 07/22/2015   Procedure: APPENDECTOMY LAPAROSCOPIC;  Surgeon: Michael Boston, MD;  Location: [facility];  Service: General;  Laterality: N/A;  . MASTECTOMY Right 02/15/2015   modified  . MODIFIED MASTECTOMY Right 02/15/2015   Procedure: RIGHT MODIFIED RADICAL MASTECTOMY;  Surgeon: Stark Klein, MD;  Location: [facility];  Service: General;  Laterality: Right;  . PORT-A-CATH REMOVAL Left 10/10/2015   Procedure: PORT REMOVAL;  Surgeon: Stark Klein, MD;  Location: [facility];  Service: General;  Laterality: Left;    . PORTACATH PLACEMENT Left 08/02/2014   Procedure: INSERTION PORT-A-CATH;  Surgeon: Stark Klein, MD;  Location: [facility];  Service: General;  Laterality: Left;  . PORTACATH PLACEMENT N/A 11/05/2016   Procedure: INSERTION PORT-A-CATH;  Surgeon: Stark Klein, MD;  Location: [facility];  Service: General;  Laterality: N/A;  . ROTATOR CUFF REPAIR Right 2005  . SCAR REVISION Right 12/08/2015   Procedure: COMPLEX REPAIR RIGHT CHEST 10-15CM;  Surgeon: [name], MD;  Location: [place] [facility];  Service: Plastics;  Laterality: Right;  . TOE SURGERY  1970s   bone spur  . TRIGGER FINGER RELEASE     x3    SOCIAL HISTORY: Social History   Tobacco Use  . Smoking status: Never Smoker  . Smokeless tobacco: Never Used  Substance Use Topics  . Alcohol use: Yes    Alcohol/week: 0.6 oz    Types: 1 Standard drinks or equivalent per week    Comment: social  . Drug use: No    FAMILY HISTORY: Family History  Problem Relation Age of Onset  . CVA Mother   . Heart disease Mother   . Sudden death Mother   . Diabetes Father   . Heart failure Father   . Hypertension Father   . Kidney disease Father   . Obesity Father   . Hypertension Sister   . Diabetes Brother   . Breast cancer Paternal Grandmother   . Diabetes Paternal Uncle     ROS: Review of Systems  Constitutional: Positive for weight loss.  Endo/Heme/Allergies:       Negative for hypoglyemia  Psychiatric/Behavioral: Positive for depression. Negative for suicidal ideas.    PHYSICAL EXAM: Blood pressure 93/64, pulse 69, height 5\' 9"  (1.753 m), weight 199 lb (90.3 kg), last menstrual period 10/22/2007, SpO2 99 %. Body mass index is  29.39 kg/m. Physical Exam  Constitutional: She is oriented to person, place, and time. She appears well-developed and well-nourished.  Cardiovascular: Normal rate.  Pulmonary/Chest: Effort normal.  Musculoskeletal: Normal range of motion.  Neurological: She is oriented to  person, place, and time.  Skin: Skin is warm and dry.  Vitals reviewed.   RECENT LABS AND TESTS: BMET    Component Value Date/Time   NA 141 07/15/2017 0816   NA 143 03/11/2017 0841   NA 140 01/07/2017 0758   K 4.1 07/15/2017 0816   K 4.1 01/07/2017 0758   CL 110 07/15/2017 0816   CO2 22 07/15/2017 0816   CO2 24 01/07/2017 0758   GLUCOSE 136 (H) 07/15/2017 0816   GLUCOSE 134 01/07/2017 0758   BUN 37 (H) 07/15/2017 0816   BUN 40 (H) 03/11/2017 0841   BUN 33.6 (H) 01/07/2017 0758   CREATININE 1.84 (H) 07/15/2017 0816   CREATININE 1.5 (H) 01/07/2017 0758   CALCIUM 9.5 07/15/2017 0816   CALCIUM 9.3 01/07/2017 0758   GFRNONAA 28 (L) 07/15/2017 0816   GFRNONAA 55 (L) 02/18/2013 0827   GFRAA 32 (L) 07/15/2017 0816   GFRAA 63 02/18/2013 0827   Lab Results  Component Value Date   HGBA1C 6.1 (H) 03/11/2017   HGBA1C 8.2 (H) 11/05/2016   HGBA1C 8.1 (H) 08/09/2016   HGBA1C 6.5 (H) 01/03/2015   HGBA1C 6.9 (H) 02/18/2013   Lab Results  Component Value Date   INSULIN 15.3 03/11/2017   INSULIN 18.3 08/09/2016   CBC    Component Value Date/Time   WBC 4.1 07/15/2017 0816   WBC 6.1 02/25/2017 0845   RBC 3.43 (L) 07/15/2017 0816   HGB 11.0 (L) 07/15/2017 0816   HGB 10.8 (L) 01/07/2017 0758   HCT 32.1 (L) 07/15/2017 0816   HCT 31.9 (L) 01/07/2017 0758   PLT 152 07/15/2017 0816   PLT 126 (L) 01/07/2017 0758   MCV 93.4 07/15/2017 0816   MCV 90.8 01/07/2017 0758   MCH 32.1 07/15/2017 0816   MCHC 34.3 07/15/2017 0816   RDW 13.8 07/15/2017 0816   RDW 14.8 (H) 01/07/2017 0758   LYMPHSABS 0.9 07/15/2017 0816   LYMPHSABS 1.1 01/07/2017 0758   MONOABS 0.3 07/15/2017 0816   MONOABS 0.3 01/07/2017 0758   EOSABS 0.1 07/15/2017 0816   EOSABS 0.1 01/07/2017 0758   EOSABS 0.1 08/09/2016 1229   BASOSABS 0.0 07/15/2017 0816   BASOSABS 0.0 01/07/2017 0758   Iron/TIBC/Ferritin/ %Sat    Component Value Date/Time   IRON 58 02/18/2013 0827   TIBC 305 02/18/2013 0827   FERRITIN 133  10/31/2014 0816   IRONPCTSAT 19 (L) 02/18/2013 0827   Lipid Panel     Component Value Date/Time   CHOL 164 02/07/2017   CHOL 182 08/09/2016 1229   TRIG 114 02/07/2017   HDL 59 02/07/2017   HDL 62 08/09/2016 1229   CHOLHDL 2.9 02/18/2013 0827   VLDL 23 02/18/2013 0827   LDLCALC 83 02/07/2017   LDLCALC 85 08/09/2016 1229   Hepatic Function Panel     Component Value Date/Time   PROT 6.6 07/15/2017 0816   PROT 6.9 03/11/2017 0841   PROT 6.7 01/07/2017 0758   ALBUMIN 4.0 07/15/2017 0816   ALBUMIN 4.5 03/11/2017 0841   ALBUMIN 3.6 01/07/2017 0758   AST 41 07/15/2017 0816   AST 51 (H) 01/07/2017 0758   ALT 70 (H) 07/15/2017 0816   ALT 82 (H) 01/07/2017 0758   ALKPHOS 92 07/15/2017 0816   ALKPHOS 115  01/07/2017 0758   BILITOT 0.7 07/15/2017 0816   BILITOT 0.72 01/07/2017 0758      Component Value Date/Time   TSH 1.54 02/07/2017   TSH 2.170 08/09/2016 1229   Results for STEFFI, NOVIELLO "[name]" (MRN [id number]) as of 07/30/2017 11:25  Ref. Range 03/11/2017 08:41  Vitamin D, 25-Hydroxy Latest Ref Range: 30.0 - 100.0 ng/mL 60.4   ASSESSMENT AND PLAN: Other depression - with emotional eating - Plan: topiramate (TOPAMAX) 25 MG tablet, buPROPion (WELLBUTRIN SR) 200 MG 12 hr tablet  Type 2 diabetes mellitus without complication, with long-term current use of insulin (HCC) - Plan: liraglutide (VICTOZA) 18 MG/3ML SOPN, metFORMIN (GLUCOPHAGE) 500 MG tablet  Other depression - Plan: topiramate (TOPAMAX) 25 MG tablet, buPROPion (WELLBUTRIN SR) 200 MG 12 hr tablet  PLAN:  Diabetes II Natash has been given extensive diabetes education by myself today including ideal fasting and post-prandial blood glucose readings, individual ideal Hgb A1c goals and hypoglycemia prevention. We discussed the importance of good blood sugar control to decrease the likelihood of diabetic complications such as nephropathy, neuropathy, limb loss, blindness, coronary artery disease, and death. We discussed  the importance of intensive lifestyle modification including diet, exercise and weight loss as the first line treatment for diabetes. Darlene [name] agrees to continue Victoza 1.8 mg qAM #3 pens with no refills, Metformin 500 mg qd #30 with no refills and we will refill nano needles for 1 month. Darlene [name] agrees to follow up at the agreed upon time.  Depression (with normal grieving reaction) We discussed behavior modification techniques today to help Kodie deal with her emotional eating and depression. She has agreed to continue Topiramate  25 mg qd #30 with no refills and Wellbutrin 200 mg qd #30 with no refills and we may need to increase dose to help her deal with possible bad health news. Darlene [name] agreed to follow up as directed.  Obesity Darlene [name] is currently in the action stage of change. As such, her goal is to concentrate on health and not weight loss while dealing with her health news. We will continue to monitor closely. She has agreed to portion control better and make smarter food choices, such as increase vegetables and decrease simple carbohydrates  Darlene [name] has been instructed to work up to a goal of 150 minutes of combined cardio and strengthening exercise per week for weight loss and overall health benefits. We discussed the following Behavioral Modification Strategies today: increase H2O intake, no skipping meals, better snacking choices, increasing vegetables and emotional eating strategies  Darlene [name] has agreed to follow up with our clinic in 3 weeks. She was informed of the importance of frequent follow up visits to maximize her success with intensive lifestyle modifications for her multiple health conditions.   OBESITY BEHAVIORAL INTERVENTION VISIT  Today's visit was # 21 out of 22.  Starting weight: 225 lbs Starting date: 08/09/16 Today's weight : 199 lbs  Today's date: 07/30/2017 Total lbs lost to date: 80 (Patients must lose 7 lbs in the first 6 months to continue with  counseling)   ASK: We discussed the diagnosis of obesity with Darlene [name] today and Darlene [name] agreed to give Darlene [name] permission to discuss obesity behavioral modification therapy today.  ASSESS: Darlene [name] has the diagnosis of obesity and her BMI today is 29.37 Darlene [name] is in the action stage of change   ADVISE: Wallace was educated on the multiple health risks of obesity as well as the benefit of weight loss to improve her health. She was advised of  the need for long term treatment and the importance of lifestyle modifications.  AGREE: Multiple dietary modification options and treatment options were discussed and  Egan agreed to the above obesity treatment plan.  I, Doreene Nest, am acting as transcriptionist for Dennard Nip, MD  I have reviewed the above documentation for accuracy and completeness, and I agree with the above. -Dennard Nip, MD

## 2017-07-30 NOTE — Progress Notes (Signed)
I was called by Dr. Lenox Ahr who was considering liver biopsy of Darlene [name].  We had previously referred her for CT biopsy, and were asked to obtain an ultrasound to see if that was more feasible than it looked.  Dr. Bartholome Bill however feels this is not going to be possible.  The location makes it too dangerous for the patient as well as technically complex.   I am going to obtain a liver MRI and we will use that for follow-up.  We are going to have to reevaluate this and likely treat without biopsy.

## 2017-07-30 NOTE — H&P (Signed)
Referring Physician(s): Chauncey Cruel  Supervising Physician: Marybelle Killings  Patient Status:  [facility]  Chief Complaint:  "I'm having a liver biopsy"   Subjective: Pt familiar to IR service from prior PICC placement on 03/18/16. She has a hx of right breast carcinoma 2016, s/p lumpectomy followed by mastectomy and chemoradiation. Recent PET scan on  07/10/17 shows new right hepatic lobe lesion concerning for mets. She presents today for image guided biopsy for further evaluation.   Past Medical History:  Diagnosis Date  . Anemia    hx of - during 1st round chemo  . Anxiety   . Arthritis    in fingers, shoulders  . Back pain   . Balance problem   . Breast cancer ([place])   . Breast cancer of upper-outer quadrant of right female breast ([place]) 07/22/2014  . Bunion, right foot   . Clumsiness   . Constipation   . Depression   . Diabetes mellitus without complication ([place])    82+ years  . Eating disorder    binge eating  . Epistaxis 08/26/2014  . Fatty liver   . HCAP (healthcare-associated pneumonia) 12/18/2014  . Headache   . Heart murmur    never had any problems  . History of radiation therapy 04/05/15-05/19/15   right chest wall was treated to 50.4 Gy in 28 fractions, right mastectomy scar was treated to 10 Gy in 5 fractions  . Hyperlipidemia   . Hypertension   . Joint pain   . Multiple food allergies   . Neuromuscular disorder ([place])    diabetic neuropathy  . Obesity   . Obstructive sleep apnea on CPAP    uses cpap nightly  . Ovarian cyst rupture    possible  . Pneumonia   . Pneumonia 11/2014  . Radiation 04/05/15-05/19/15   right chest wall 50.4 Gy, mastectomy scar 10 Gy  . Sleep apnea   . Thyroid nodule 06/2014   Past Surgical History:  Procedure Laterality Date  . APPENDECTOMY    . BIOPSY THYROID Bilateral 06/2014   2 nodules - Benign   . BREAST LUMPECTOMY WITH RADIOACTIVE SEED AND SENTINEL LYMPH NODE BIOPSY Right 12/27/2014   Procedure: BREAST LUMPECTOMY WITH  RADIOACTIVE SEED AND SENTINEL LYMPH NODE BIOPSY;  Surgeon: Stark Klein, MD;  Location: [place];  Service: General;  Laterality: Right;  . BREAST REDUCTION SURGERY Bilateral 01/06/2015   Procedure: BILATERAL BREAST REDUCTION, RIGHT ONCOPLASTIC RECONSTRUCTION),LEFT BREAST REDUCTION FOR SYMMETRY;  Surgeon: [name], MD;  Location: [place];  Service: Plastics;  Laterality: Bilateral;  . BREATH TEK H PYLORI N/A 09/15/2012   Procedure: BREATH TEK H PYLORI;  Surgeon: Pedro Earls, MD;  Location: [facility];  Service: General;  Laterality: N/A;  . BUNIONECTOMY Left 12/2013  . CARPAL TUNNEL RELEASE Right   . CARPAL TUNNEL RELEASE Left   . CATARACT EXTRACTION     6/19  . COLONOSCOPY    . DUPUYTREN CONTRACTURE RELEASE    . GASTRIC ROUX-EN-Y N/A 11/02/2012   Procedure: LAPAROSCOPIC ROUX-EN-Y GASTRIC;  Surgeon: Pedro Earls, MD;  Location: [facility];  Service: General;  Laterality: N/A;  . IR GENERIC HISTORICAL  03/18/2016   IR US GUIDE VASC ACCESS LEFT 03/18/2016 Markus Daft, MD [facility]  . IR GENERIC HISTORICAL  03/18/2016   IR FLUORO GUIDE CV LINE LEFT 03/18/2016 Markus Daft, MD [facility]  . LAPAROSCOPIC APPENDECTOMY N/A 07/22/2015   Procedure: APPENDECTOMY LAPAROSCOPIC;  Surgeon: Michael Boston, MD;  Location: [facility];  Service: General;  Laterality: N/A;  . MASTECTOMY Right 02/15/2015   modified  . MODIFIED MASTECTOMY Right 02/15/2015   Procedure: RIGHT MODIFIED RADICAL MASTECTOMY;  Surgeon: Stark Klein, MD;  Location: [name];  Service: General;  Laterality: Right;  . PORT-A-CATH REMOVAL Left 10/10/2015   Procedure: PORT REMOVAL;  Surgeon: Stark Klein, MD;  Location: [place];  Service: General;  Laterality: Left;  . PORTACATH PLACEMENT Left 08/02/2014   Procedure: INSERTION PORT-A-CATH;  Surgeon: Stark Klein, MD;  Location: [place];  Service: General;  Laterality: Left;  . PORTACATH PLACEMENT N/A 11/05/2016   Procedure: INSERTION PORT-A-CATH;  Surgeon:  Stark Klein, MD;  Location: [place];  Service: General;  Laterality: N/A;  . ROTATOR CUFF REPAIR Right 2005  . SCAR REVISION Right 12/08/2015   Procedure: COMPLEX REPAIR RIGHT CHEST 10-15CM;  Surgeon: [name], MD;  Location: [place];  Service: Plastics;  Laterality: Right;  . TOE SURGERY  1970s   bone spur  . TRIGGER FINGER RELEASE     x3     Allergies: Nsaids and Tape  Medications: Prior to Admission medications   Medication Sig Start Date End Date Taking? Authorizing Provider  acetaminophen (TYLENOL) 325 MG tablet Take 650 mg by mouth 2 (two) times daily as needed for moderate pain or headache.    Yes [provider]  CALCIUM CITRATE-VITAMIN D PO Take 1 tablet by mouth 3 (three) times daily.    Yes [provider]  cholecalciferol (VITAMIN D) 1000 units tablet Take 1,000 Units by mouth daily.   Yes [provider]  Cyanocobalamin (VITAMIN B-12 PO) Take 1 tablet by mouth every 14 (fourteen) days.   Yes [provider]  diphenhydrAMINE (BENADRYL) 25 MG tablet Take 25 mg by mouth daily as needed for allergies or sleep.    Yes [provider]  hydrochlorothiazide (HYDRODIURIL) 25 MG tablet TAKE 1 TABLET BY MOUTH EVERY DAY 08/23/16  Yes Larey Dresser, MD  Insulin Glargine (BASAGLAR KWIKPEN) 100 UNIT/ML SOPN Inject 0.16 mLs (16 Units total) into the skin at bedtime. Patient taking differently: Inject 12 Units into the skin daily.  04/21/17  Yes Beasley, Caren D, MD  losartan (COZAAR) 100 MG tablet Take 100 mg by mouth daily.  05/19/13  Yes [provider]  Melatonin 3 MG CAPS Take 3 mg by mouth at bedtime.    Yes [provider]  Pediatric Multivitamins-Iron (FLINTSTONES PLUS IRON) chewable tablet Chew 1 tablet by mouth 2 (two) times daily.   Yes [provider]  Probiotic Product ([employer] PROBIOTIC PO) Take 1 capsule by mouth daily.    Yes [provider]  Propylene Glycol (SYSTANE BALANCE  OP) Place 1-2 drops into both eyes 3 (three) times daily as needed (for dry eyes).    Yes [provider]  senna-docusate (SENNA PLUS) 8.6-50 MG tablet Take 1 tablet by mouth daily as needed for moderate constipation.    Yes [provider]  vortioxetine HBr (TRINTELLIX) 20 MG TABS Take 20 mg by mouth at bedtime. 02/24/17  Yes Eber Jones, MD  buPROPion [facility] SR) 200 MG 12 hr tablet Take 1 tablet (200 mg total) by mouth daily at 12 noon. 07/30/17   Dennard Nip D, MD  Insulin Pen Needle (BD PEN NEEDLE NANO U/F) 32G X 4 MM MISC 1 Package by Does not apply route daily at 12 noon. 06/10/17   Dennard Nip D, MD  liraglutide (VICTOZA) 18 MG/3ML SOPN Inject 0.3 mLs (  1.8 mg total) into the skin every morning. 07/30/17   Dennard Nip D, MD  metFORMIN (GLUCOPHAGE) 500 MG tablet Take 1 tablet (500 mg total) by mouth daily. 07/30/17   Starlyn Skeans, MD  [facility] DELICA LANCETS 82C MISC 1 Package by Does not apply route daily. 04/01/17   Dennard Nip D, MD  topiramate (TOPAMAX) 25 MG tablet Take 1 tablet (25 mg total) by mouth daily. 07/30/17   Dennard Nip D, MD     Vital Signs: BP 129/66; HR 70; TEMP 98.1 LMP 10/22/2007   Physical Exam awake/alert; chest- CTA bilat; clean, intact left chest port a cath; heart- RRR; abd- soft,+BS,NT; no LE edema  Imaging: No results found.  Labs:  CBC: Recent Labs    04/22/17 0848 05/20/17 0813 06/17/17 0823 07/15/17 0816  WBC 4.8 4.6 4.8 4.1  HGB 11.9 10.9* 11.2* 11.0*  HCT 34.9 32.5* 33.4* 32.1*  PLT 158 142* 150 152    COAGS: Recent Labs    10/01/16 1559  INR 1.00*    BMP: Recent Labs    04/22/17 0848 05/20/17 0813 06/17/17 0833 07/15/17 0816  NA 139 141 143 141  K 4.0 3.9 3.7 4.1  CL 109 111* 109 110  CO2 21* 23 22 22   GLUCOSE 174* 138 126* 136*  BUN 61* 34* 44* 37*  CALCIUM 9.6 9.4 9.2 9.5  CREATININE 2.21* 1.62* 1.89* 1.84*  GFRNONAA 22* 33* 27* 28*  GFRAA 26* 38* 31* 32*    LIVER FUNCTION  TESTS: Recent Labs    04/22/17 0848 05/20/17 0813 06/17/17 0833 07/15/17 0816  BILITOT 0.7 0.6 0.6 0.7  AST 30 41* 32 41  ALT 35 59* 40 70*  ALKPHOS 99 92 83 92  PROT 7.0 6.5 7.1 6.6  ALBUMIN 4.0 3.7 4.0 4.0    Assessment and Plan:  Pt with hx of right breast carcinoma 2016, s/p lumpectomy followed by mastectomy and chemoradiation. Recent PET scan on  07/10/17 shows new right hepatic lobe lesion concerning for mets. She presents today for image guided biopsy for further evaluation. Risks and benefits discussed with the patient/spouse including, but not limited to bleeding, infection, damage to adjacent structures or low yield requiring additional tests.  All of the patient's questions were answered, patient is agreeable to proceed. Consent signed and in chart.  LABS PENDING    Electronically Signed: D. Rowe Robert, PA-C 07/30/2017, 11:41 AM   I spent a total of 25 minutes at the the patient's bedside AND on the patient's hospital floor or unit, greater than 50% of which was counseling/coordinating care for image guided liver lesion biopsy

## 2017-07-30 NOTE — Sedation Documentation (Signed)
Dr. Barbie Banner unable to perform procedure d/c location/visability of area.

## 2017-08-01 ENCOUNTER — Telehealth: Payer: Self-pay | Admitting: Oncology

## 2017-08-01 NOTE — Telephone Encounter (Signed)
Called regarding 7/23

## 2017-08-02 ENCOUNTER — Ambulatory Visit (HOSPITAL_COMMUNITY)
Admission: RE | Admit: 2017-08-02 | Discharge: 2017-08-02 | Disposition: A | Discharge status: Home / Self Care | Payer: BC Managed Care – PPO | Source: Ambulatory Visit | Attending: Oncology | Admitting: Oncology

## 2017-08-02 DIAGNOSIS — K769 Liver disease, unspecified: Secondary | ICD-10-CM | POA: Diagnosis not present

## 2017-08-02 MED ORDER — GADOBENATE DIMEGLUMINE 529 MG/ML IV SOLN
10.0000 mL | Freq: Once | INTRAVENOUS | Status: AC | PRN
  Administered 2017-08-02: 9 mL via INTRAVENOUS

## 2017-08-04 ENCOUNTER — Other Ambulatory Visit: Payer: Self-pay | Admitting: Oncology

## 2017-08-04 DIAGNOSIS — Z171 Estrogen receptor negative status [ER-]: Secondary | ICD-10-CM

## 2017-08-04 DIAGNOSIS — C50411 Malignant neoplasm of upper-outer quadrant of right female breast: Secondary | ICD-10-CM

## 2017-08-04 DIAGNOSIS — C50911 Malignant neoplasm of unspecified site of right female breast: Secondary | ICD-10-CM

## 2017-08-04 DIAGNOSIS — C787 Secondary malignant neoplasm of liver and intrahepatic bile duct: Secondary | ICD-10-CM | POA: Insufficient documentation

## 2017-08-04 NOTE — Progress Notes (Signed)
I called Darlene [name] and discussed the results of the liver MRI.  We are going to take this as a metastatic deposit and switch her back to T-DM 1.  She will be restaged after 4 doses.  She understands the goal of care here is control.  We also talked about her insurance issues and I have asked her to call the Environmental consultant and find out when the last insurance payment was made on her behalf by her now bankrupt company.

## 2017-08-05 ENCOUNTER — Encounter (INDEPENDENT_AMBULATORY_CARE_PROVIDER_SITE_OTHER): Payer: Self-pay | Admitting: Family Medicine

## 2017-08-06 ENCOUNTER — Other Ambulatory Visit (INDEPENDENT_AMBULATORY_CARE_PROVIDER_SITE_OTHER): Payer: Self-pay | Admitting: Family Medicine

## 2017-08-06 DIAGNOSIS — Z794 Long term (current) use of insulin: Secondary | ICD-10-CM

## 2017-08-06 DIAGNOSIS — E119 Type 2 diabetes mellitus without complications: Secondary | ICD-10-CM

## 2017-08-11 ENCOUNTER — Telehealth: Payer: Self-pay | Admitting: Oncology

## 2017-08-11 NOTE — Progress Notes (Signed)
[facility]  [phone] Fax:([phone]) [phone]   ID: Darlene [name] DOB: Nov 15, 1950  MR#: [id number]  JYN#:[id number]  Patient Care Team: Vernie Shanks, MD as PCP - General (Family Medicine) Himmelrich, Bryson Ha, RD (Inactive) as Dietitian (Bariatrics) Stark Klein, MD as Consulting Physician (General Surgery) Magrinat, Virgie Dad, MD as Consulting Physician (Oncology) Arloa Koh, MD as Consulting Physician (Radiation Oncology) Mauro Kaufmann, RN as Registered Nurse Rockwell Germany, RN as Registered Nurse Jacelyn Pi, MD as Consulting Physician (Endocrinology) Kem Boroughs, [place] as Nurse Practitioner (Family Medicine) [name], MD as Consulting Physician (Plastic Surgery) Larey Dresser, MD as Consulting Physician (Cardiology) Gery Pray, MD as Consulting Physician (Radiation Oncology) PCP: Vernie Shanks, MD OTHER MD:  CHIEF COMPLAINT:  HER-2 positive, estrogen receptor negative locally recurrent disease  CURRENT TREATMENT:  T-DM1  INTERVAL HISTORY:  Darlene [name] returns today for follow-up and treatment of her estrogen receptor negative but HER-2 amplified breast cancer accompanied by her husband. Today is day 1 cycle 1 of Kadcyla given every 21 days.   She had bruising and decreased platelets while back after many Kadcyla treatments and she had concerns about this.  Since her last visit: She completed a liver MRI on 08/02/2017 showing: The lesion of concern in segment 8 of the liver has imaging characteristics concerning for a small metastatic lesion. This currently measures 14 mm in diameter. No other hepatic lesions or additional sites of potential metastatic disease are noted elsewhere in the abdomen. Subtle morphologic changes suggestive of cirrhosis in the liver.  Note that we have been unable to obtain a biopsy of this area because of its difficult location.  Accordingly it is being treated as metastatic breast cancer.  Her  most recent echocardiogram was 04/24/2017 and showed an ejection fraction of 55-60%.  REVIEW OF SYSTEMS: Darlene [name] reports that she is nervous for having metastatic cancer. She has been trying to drink more water. She notes that the last liver biopsy was in a position that was dangerous to biopsy. She has been researching more information on metastatic disease, and she feels a little anxious and scared. She plans on exercising more often.    She reports that her medical bills are now in excess of $90,000. She is looking at the insurance premiums that she paid while working before her company went bankrupt, and she is still reviewing information for her insurance coverage. Her husband notes that her company paid the least expensive cost and did not cover the more expensive costs.  She denies unusual headaches, visual changes, nausea, vomiting, or dizziness. There has been no unusual cough, phlegm production, or pleurisy. This been no change in bowel or bladder habits. She denies unexplained fatigue or unexplained weight loss, bleeding, rash, or fever. A detailed review of systems was otherwise stable.     BREAST CANCER HISTORY: From the original intake note:  "Darlene [name]" had screening mammography at her gynecologist suggestive of a possible abnormality in the right breast. However right diagnostic mammogram 04/12/2013 at [facility] was negative. More recently however, she noted a lump in her right breast and brought it to her gynecologist's attention. On 07/18/2014 the patient had bilateral diagnostic mammography with tomosynthesis at [facility]. The breast density was category B. In the right breast upper outer quadrant there was an area of focal asymmetry with indistinct margins.[name] Ultrasound was obtained and showed in addition to the mass in question, measuring 1.7 cm, an abnormal-appearing lymph node in the right axillary tail.  On 07/19/2014 the patient underwent biopsy of the right breast massin question as well as a  right axillary lymph node. Both were positive for invasive ductal carcinoma, grade 2, estrogen and progesterone receptor negative, with an MIB-1 of 90%, and HER-2 pending. Incidentally the lymph node biopsy showed a lymphocytic inflammatory component but no lymph node architecture.  Her subsequent history is as detailed below.  PAST MEDICAL HISTORY: Past Medical History:  Diagnosis Date   Anemia    hx of - during 1st round chemo   Anxiety    Arthritis    in fingers, shoulders   Back pain    Balance problem    Breast cancer ([place])    Breast cancer of upper-outer quadrant of right female breast ([place]) 07/22/2014   Bunion, right foot    Clumsiness    Constipation    Depression    Diabetes mellitus without complication ([place])    46+ years   Eating disorder    binge eating   Epistaxis 08/26/2014   Fatty liver    HCAP (healthcare-associated pneumonia) 12/18/2014   Headache    Heart murmur    never had any problems   History of radiation therapy 04/05/15-05/19/15   right chest wall was treated to 50.4 Gy in 28 fractions, right mastectomy scar was treated to 10 Gy in 5 fractions   Hyperlipidemia    Hypertension    Joint pain    Multiple food allergies    Neuromuscular disorder ([place])    diabetic neuropathy   Obesity    Obstructive sleep apnea on CPAP    uses cpap nightly   Ovarian cyst rupture    possible   Pneumonia    Pneumonia 11/2014   Radiation 04/05/15-05/19/15   right chest wall 50.4 Gy, mastectomy scar 10 Gy   Sleep apnea    Thyroid nodule 06/2014    PAST SURGICAL HISTORY: Past Surgical History:  Procedure Laterality Date   APPENDECTOMY     BIOPSY THYROID Bilateral 06/2014   2 nodules - Benign    BREAST LUMPECTOMY WITH RADIOACTIVE SEED AND SENTINEL LYMPH NODE BIOPSY Right 12/27/2014   Procedure: BREAST LUMPECTOMY WITH RADIOACTIVE SEED AND SENTINEL LYMPH NODE BIOPSY;  Surgeon: Stark Klein, MD;  Location: [facility];   Service: General;  Laterality: Right;   BREAST REDUCTION SURGERY Bilateral 01/06/2015   Procedure: BILATERAL BREAST REDUCTION, RIGHT ONCOPLASTIC RECONSTRUCTION),LEFT BREAST REDUCTION FOR SYMMETRY;  Surgeon: [name], MD;  Location: [facility];  Service: Plastics;  Laterality: Bilateral;   BREATH TEK H PYLORI N/A 09/15/2012   Procedure: BREATH TEK H PYLORI;  Surgeon: Pedro Earls, MD;  Location: [facility];  Service: General;  Laterality: N/A;   BUNIONECTOMY Left 12/2013   CARPAL TUNNEL RELEASE Right    CARPAL TUNNEL RELEASE Left    CATARACT EXTRACTION     6/19   COLONOSCOPY     DUPUYTREN CONTRACTURE RELEASE     GASTRIC ROUX-EN-Y N/A 11/02/2012   Procedure: LAPAROSCOPIC ROUX-EN-Y GASTRIC;  Surgeon: Pedro Earls, MD;  Location: [facility];  Service: General;  Laterality: N/A;   IR GENERIC HISTORICAL  03/18/2016   IR US GUIDE VASC ACCESS LEFT 03/18/2016 Markus Daft, MD [facility]   IR GENERIC HISTORICAL  03/18/2016   IR FLUORO GUIDE CV LINE LEFT 03/18/2016 Markus Daft, MD [facility]   LAPAROSCOPIC APPENDECTOMY N/A 07/22/2015   Procedure: APPENDECTOMY LAPAROSCOPIC;  Surgeon: Michael Boston, MD;  Location: [facility];  Service: General;  Laterality: N/A;   MASTECTOMY  Right 02/15/2015   modified   MODIFIED MASTECTOMY Right 02/15/2015   Procedure: RIGHT MODIFIED RADICAL MASTECTOMY;  Surgeon: Stark Klein, MD;  Location: [facility];  Service: General;  Laterality: Right;   PORT-A-CATH REMOVAL Left 10/10/2015   Procedure: PORT REMOVAL;  Surgeon: Stark Klein, MD;  Location: [facility];  Service: General;  Laterality: Left;   PORTACATH PLACEMENT Left 08/02/2014   Procedure: INSERTION PORT-A-CATH;  Surgeon: Stark Klein, MD;  Location: [facility];  Service: General;  Laterality: Left;   PORTACATH PLACEMENT N/A 11/05/2016   Procedure: INSERTION PORT-A-CATH;  Surgeon: Stark Klein, MD;  Location: [facility];  Service: General;  Laterality: N/A;   ROTATOR CUFF REPAIR Right 2005   SCAR  REVISION Right 12/08/2015   Procedure: COMPLEX REPAIR RIGHT CHEST 10-15CM;  Surgeon: [name], MD;  Location: [facility];  Service: Plastics;  Laterality: Right;   TOE SURGERY  1970s   bone spur   TRIGGER FINGER RELEASE     x3    FAMILY HISTORY Family History  Problem Relation Age of Onset   CVA Mother    Heart disease Mother    Sudden death Mother    Diabetes Father    Heart failure Father    Hypertension Father    Kidney disease Father    Obesity Father    Hypertension Sister    Diabetes Brother    Breast cancer Paternal Grandmother    Diabetes Paternal Uncle    The patient's father died from complications of diabetes at age 74. The patient's mother died at age 64 with a subarachnoid hemorrhage. The patient had one brother, one sister. The only breast cancer was the patient's paternal grandmother diagnosed in her 78s. There is no history of ovarian cancer in the family.  GYNECOLOGIC HISTORY:  Patient's last menstrual period was 10/22/2007.  menarche age 8, the patient is GX P0. She went through the change of life in 2011. She did not take hormone replacement.  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 Her husband [name] is a retired Visual merchandiser (he taught at [employer]).  [name] has 2 children from an earlier marriage, Rockey Situ who teaches in [place], and Chane Magner,  who works at the [employer] in in [place]. He has 1 grand son, aged 20. The patient and her husband are not church attenders.    ADVANCED DIRECTIVES:  Not in place   HEALTH MAINTENANCE: Social History   Tobacco Use   Smoking status: Never Smoker   Smokeless tobacco: Never Used  Substance Use Topics   Alcohol use: Yes    Alcohol/week: 0.6 oz    Types: 1 Standard drinks or equivalent per week    Comment: social    Drug use: No     Colonoscopy:  May 2016  PAP:  Bone density: 07/01/2016 at [facility] showed normal, with a T score of -0.7  Lipid panel:  Allergies  Allergen Reactions   Nsaids Other (See Comments)    H/o gastric bypass - avoid NSAIDs   Tape Hives, Rash and Other (See Comments)    Adhesives in bandaids    Current Outpatient Medications  Medication Sig Dispense Refill   acetaminophen (TYLENOL) 325 MG tablet Take 650 mg by mouth 2 (two) times daily as needed for moderate pain or headache.      buPROPion (WELLBUTRIN SR) 200  MG 12 hr tablet Take 1 tablet (200 mg total) by mouth daily at 12 noon. 30 tablet 0   CALCIUM CITRATE-VITAMIN D PO Take 1 tablet by mouth 3 (three) times daily.      cholecalciferol (VITAMIN D) 1000 units tablet Take 1,000 Units by mouth daily.     Cyanocobalamin (VITAMIN B-12 PO) Take 1 tablet by mouth every 14 (fourteen) days.     diphenhydrAMINE (BENADRYL) 25 MG tablet Take 25 mg by mouth daily as needed for allergies or sleep.      hydrochlorothiazide (HYDRODIURIL) 25 MG tablet TAKE 1 TABLET BY MOUTH EVERY DAY 30 tablet 11   Insulin Glargine (BASAGLAR KWIKPEN) 100 UNIT/ML SOPN Inject 0.16 mLs (16 Units total) into the skin at bedtime. (Patient taking differently: Inject 12 Units into the skin daily. ) 2 pen 0   Insulin Pen Needle (BD PEN NEEDLE NANO U/F) 32G X 4 MM MISC 1 Package by Does not apply route daily at 12 noon. 100 each 0   liraglutide (VICTOZA) 18 MG/3ML SOPN Inject 0.3 mLs (1.8 mg total) into the skin every morning. 3 pen 0   losartan (COZAAR) 100 MG tablet Take 100 mg by mouth daily.      Melatonin 3 MG CAPS Take 3 mg by mouth at bedtime.      metFORMIN (GLUCOPHAGE) 500 MG tablet Take 1 tablet (500 mg total) by mouth daily. 30 tablet 0   ONETOUCH DELICA LANCETS 00F MISC 1 Package by Does not apply route daily. 100 each 0   Pediatric Multivitamins-Iron (FLINTSTONES PLUS IRON) chewable tablet Chew 1 tablet by mouth 2 (two) times daily.       Probiotic Product ([employer]S PROBIOTIC PO) Take 1 capsule by mouth daily.      Propylene Glycol (SYSTANE BALANCE OP) Place 1-2 drops into both eyes 3 (three) times daily as needed (for dry eyes).      senna-docusate (SENNA PLUS) 8.6-50 MG tablet Take 1 tablet by mouth daily as needed for moderate constipation.      topiramate (TOPAMAX) 25 MG tablet Take 1 tablet (25 mg total) by mouth daily. 6 tablet 0   vortioxetine HBr (TRINTELLIX) 20 MG TABS Take 20 mg by mouth at bedtime. 30 tablet 0   No current facility-administered medications for this visit.    Facility-Administered Medications Ordered in Other Visits  Medication Dose Route Frequency Provider Last Rate Last Dose   heparin lock flush 100 unit/mL  500 Units Intracatheter Once PRN Magrinat, Virgie Dad, MD       sodium chloride flush (NS) 0.9 % injection 10 mL  10 mL Intracatheter PRN Magrinat, Virgie Dad, MD        OBJECTIVE: Middle-aged white woman who appears stated age  For violence to today's visit please refer to the chemotherapy flow sheet There were no vitals filed for this visit.   There is no height or weight on file to calculate BMI.    ECOG FS:1 - Symptomatic but completely ambulatory There were no vitals filed for this visit.  Sclerae unicteric, pupils round and equal No cervical or supraclavicular adenopathy Lungs no rales or rhonchi Heart regular rate and rhythm Abd soft, nontender, positive bowel sounds MSK  no upper extremity lymphedema Neuro: nonfocal, well oriented, appropriate affect Breasts: Deferred   LAB RESULTS:  CMP     Component Value Date/Time   NA 141 08/12/2017 1132   NA 143 03/11/2017 0841   NA 140 01/07/2017 0758   K 4.3 08/12/2017 1132  K 4.1 01/07/2017 0758   CL 108 08/12/2017 1132   CO2 25 08/12/2017 1132   CO2 24 01/07/2017 0758   GLUCOSE 86 08/12/2017 1132   GLUCOSE 134 01/07/2017 0758   BUN 38 (H) 08/12/2017 1132   BUN 40 (H) 03/11/2017 0841   BUN 33.6 (H) 01/07/2017 0758    CREATININE 1.63 (H) 08/12/2017 1132   CREATININE 1.5 (H) 01/07/2017 0758   CALCIUM 9.4 08/12/2017 1132   CALCIUM 9.3 01/07/2017 0758   PROT 6.6 08/12/2017 1132   PROT 6.9 03/11/2017 0841   PROT 6.7 01/07/2017 0758   ALBUMIN 4.0 08/12/2017 1132   ALBUMIN 4.5 03/11/2017 0841   ALBUMIN 3.6 01/07/2017 0758   AST 42 (H) 08/12/2017 1132   AST 51 (H) 01/07/2017 0758   ALT 82 (H) 08/12/2017 1132   ALT 82 (H) 01/07/2017 0758   ALKPHOS 106 08/12/2017 1132   ALKPHOS 115 01/07/2017 0758   BILITOT 0.6 08/12/2017 1132   BILITOT 0.72 01/07/2017 0758   GFRNONAA 32 (L) 08/12/2017 1132   GFRNONAA 55 (L) 02/18/2013 0827   GFRAA 37 (L) 08/12/2017 1132   GFRAA 63 02/18/2013 0827    INo results found for: SPEP, UPEP  Lab Results  Component Value Date   WBC 5.0 08/12/2017   NEUTROABS 3.3 08/12/2017   HGB 11.3 (L) 08/12/2017   HCT 32.8 (L) 08/12/2017   MCV 92.7 08/12/2017   PLT 146 08/12/2017      Chemistry      Component Value Date/Time   NA 141 08/12/2017 1132   NA 143 03/11/2017 0841   NA 140 01/07/2017 0758   K 4.3 08/12/2017 1132   K 4.1 01/07/2017 0758   CL 108 08/12/2017 1132   CO2 25 08/12/2017 1132   CO2 24 01/07/2017 0758   BUN 38 (H) 08/12/2017 1132   BUN 40 (H) 03/11/2017 0841   BUN 33.6 (H) 01/07/2017 0758   CREATININE 1.63 (H) 08/12/2017 1132   CREATININE 1.5 (H) 01/07/2017 0758      Component Value Date/Time   CALCIUM 9.4 08/12/2017 1132   CALCIUM 9.3 01/07/2017 0758   ALKPHOS 106 08/12/2017 1132   ALKPHOS 115 01/07/2017 0758   AST 42 (H) 08/12/2017 1132   AST 51 (H) 01/07/2017 0758   ALT 82 (H) 08/12/2017 1132   ALT 82 (H) 01/07/2017 0758   BILITOT 0.6 08/12/2017 1132   BILITOT 0.72 01/07/2017 0758       No results found for: LABCA2  No components found for: LABCA125  No results for input(s): INR in the last 168 hours.  Urinalysis    Component Value Date/Time   COLORURINE YELLOW 07/21/2015 [place] 07/21/2015 2328   LABSPEC 1.020  07/21/2015 2328   PHURINE 6.0 07/21/2015 2328   GLUCOSEU NEGATIVE 07/21/2015 2328   HGBUR TRACE (A) 07/21/2015 2328   BILIRUBINUR NEGATIVE 07/21/2015 2328   BILIRUBINUR neg 06/29/2013 [place] 07/21/2015 2328   PROTEINUR 30 (A) 07/21/2015 2328   UROBILINOGEN negative 06/29/2013 1017   NITRITE NEGATIVE 07/21/2015 2328   LEUKOCYTESUR NEGATIVE 07/21/2015 2328    STUDIES: Mr Liver W Wo Contrast  Result Date: 08/02/2017 CLINICAL DATA:  67 year old female with history of breast cancer. Suspected metastatic disease. Assess response to neoadjuvant chemotherapy. EXAM: MRI ABDOMEN WITHOUT AND WITH CONTRAST TECHNIQUE: Multiplanar multisequence MR imaging of the abdomen was performed both before and after the administration of intravenous contrast. CONTRAST:  102m MULTIHANCE GADOBENATE DIMEGLUMINE 529 MG/ML IV SOLN COMPARISON:  No prior abdominal MRI.  PET-CT 07/10/2017. FINDINGS: Lower chest: Unremarkable. Hepatobiliary: Liver has a slightly nodular contour, suggesting a background of mild cirrhosis. In segment 8 of the liver (axial image 9 of series 3) there is a 14 mm T1 hypointense, mildly T2 hyperintense lesion which is hypovascular, but appears to have some low level internal blood flow on post gadolinium images, which corresponds to the hypermetabolic lesion noted on prior PET-CT 07/10/2017. No other suspicious hepatic lesions are noted. No intra or extrahepatic biliary ductal dilatation. Small filling defects lying dependently in the gallbladder compatible with tiny gallstones. No findings to suggest an acute cholecystitis at this time. Pancreas: No pancreatic mass. No pancreatic ductal dilatation. No pancreatic or peripancreatic fluid or inflammatory changes. Spleen:  Unremarkable. Adrenals/Urinary Tract: Thickening of bilateral adrenal glands is stable compared to prior examinations, presumably reflective of adrenal hyperplasia. Bilateral kidneys are normal in appearance. No  hydroureteronephrosis in the visualized portions of the abdomen. Stomach/Bowel: Postoperative changes of gastrojejunostomy are noted. Otherwise, unremarkable. Vascular/Lymphatic: Aortic atherosclerosis without definite aneurysm in the abdominal vasculature. No lymphadenopathy noted in the abdomen. Other: No significant volume of ascites noted in the visualized portions of the peritoneal cavity. Musculoskeletal: No aggressive appearing osseous lesions are noted in the visualized portions of the skeleton. IMPRESSION: 1. The lesion of concern in segment 8 of the liver has imaging characteristics concerning for a small metastatic lesion. This currently measures 14 mm in diameter. No other hepatic lesions or additional sites of potential metastatic disease are noted elsewhere in the abdomen. 2. Subtle morphologic changes suggestive of cirrhosis in the liver. 3. Additional incidental findings, as above. Electronically Signed   By: Vinnie Langton M.D.   On: 08/02/2017 11:44   US Abdomen Limited  Result Date: 07/30/2017 CLINICAL DATA:  The patient's refer for biopsy of a liver lesion EXAM: ULTRASOUND ABDOMEN LIMITED COMPARISON:  None. FINDINGS: The liver lesion cannot be definitively visualized by ultrasound. Biopsy was not performed. IMPRESSION: The liver lesion cannot be definitively visualized. Biopsy was not performed. Electronically Signed   By: Marybelle Killings M.D.   On: 07/30/2017 14:37   US Abdomen Limited Ruq  Result Date: 07/21/2017 CLINICAL DATA:  Possible hepatic metastatic lesion. History of breast cancer. EXAM: ULTRASOUND ABDOMEN LIMITED RIGHT UPPER QUADRANT COMPARISON:  PET scan of July 10, 2017. Ultrasound of May 10, 2015. FINDINGS: Gallbladder: Minimal cholelithiasis is noted without gallbladder wall thickening or pericholecystic fluid. No sonographic Murphy's sign is noted. Common bile duct: Diameter: 2.9 mm which is within normal limits. Liver: Heterogeneous echotexture of hepatic parenchyma is  noted. Faint hypoechoic lesion measuring 1.9 cm is noted in the right hepatic lobe. Portal vein is patent on color Doppler imaging with normal direction of blood flow towards the liver. IMPRESSION: Minimal cholelithiasis without evidence of cholecystitis. 1.9 cm faint hypoechoic lesion seen in right hepatic lobe which roughly corresponds in location to abnormality seen on PET scan. This is concerning for metastatic disease. Electronically Signed   By: Marijo Conception, M.D.   On: 07/21/2017 16:28     ASSESSMENT: 67 y.o. [place] woman status post right breast upper outer quadrant and right axillary lymph node biopsy 07/19/2014 for a cT2  pN1, stage IIB  invasive ductal carcinoma, grade 2, estrogen and progesterone receptor negative, with an MIB-1 of 90%, and HER-2 amplified  (1) neoadjuvant chemotherapy started 08/08/2014, consisting of carboplatin, docetaxel, trastuzumab and pertuzumab given every 3 weeks 6, completed 11/21/2014  (a) breast MRI after initial 3 cycles shows a  significant response  (b) breast MRI after 6 cycles shows a complete radiologic response   (2) trastuzumab continued through 10/02/2015 (to end of capecitabine therapy)  (a) echo 02/13/2015 shows an EF of 60-65%  (b)  echo 05/09/2015 shows an ejection fraction of 60-65%  (c) echocardiogram 06/27/2015 shows an ejection fraction of 55-60%.  (d) echocardiogram 01/22/2017 shows an ejection fraction of 55-60%  (3) right lumpectomy with sentinel lymph node biopsy on 12/27/14 showed a residual  ypT2 ypN1a, stage IIB invasive ductal carcinoma, grade 2, with repeat estrogen and progesterone receptors again negative  (4) status post right oncoplastic surgery (with left reduction mammoplasty) 01/06/2015 showing in the right breast multiple foci of intralymphatic carcinoma in random sections  (5)  Right modified radical mastectomy 02/15/2014 showed no residual invasive disease; there was DCIS but margins were negative; all 15 axillary  lymph nodes were clear; ypTis ypN0  (a)  The patient has decided against reconstruction.  (6)  Postmastectomy radiation completed 05/18/2015 with capecitabine sensitization  (7) adjuvant capecitabine continued through 09/27/2015  (8) hepatic steatosis with increased fibrosis score (at risk for cirrhosis)  (9) thyroid biopsy (left lobe inferiorly) 2 shows benign tissue 06/28/2015  (10) LOCAL RECURRENCE:  (a) PET scan 12/06/2015 Notes a right retropectoral lymph node measuring 0.9 cm with an SUV max of 5.5  (b) repeat PET scan 03/11/2016 finds the right subpectoral lymph node to now measure 1.3 cm with an SUV max of 11.8.  (c) PET scan 05/07/2016 shows a significant response to T-DM 1  (d) PET scan 07/10/2017 is clear except for what appears to be a single new liver lesion   (11) started T-DM1 03/21/2016, completed 8 doses 08/13/2016  (a) PET scan 08/19/2016 shows a good response but measurable residual disease  (b) PET scan on 01/16/2017 shows a continuing response  (12) maintenance trastuzumab started 09/03/2016--changed to every 28 days as of 01/28/2017  (a) repeat echocardiogram 07/08/2016 shows an ejection fraction of 60-65%.  (b) echocardiogram on 10/08/2016 demonstrates LVEF of 55-60% (followed by Dr. Haroldine Laws)  (c) echo 01/22/2017 shows an ejection fraction in the 55-60% range  (d) echo 04/24/2017 shows an ejection fraction in the 55-60% range  (e) trastuzumab changed to T-DM 1 after trastuzumab dose 07/15/2017  METASTATIC DISEASE: (13)  radiation to the area of persistent disease in the right upper chest completed 10/30/2016   (14) T-DM 1 resumed 08/12/2017, repeated every 21 days  (a) baseline liver MRI 08/02/2017 shows a 1.4 cm mass, "hot" on PET 07/10/2017  PLAN: Darlene [name] has a 1.4 cm liver mass which we are not able to biopsy, but which almost certainly is metastatic breast cancer.  She understands this is not curable and that she likely has other metastatic deposits which  are not currently demonstrable on scans.  This is why she needs systemic treatment.  We are going back to T-DM 1 which previously was effective.  She tolerated it generally quite well.  Certainly she would tolerated better than chemotherapy.  The plan is to do 4 cycles and restage.  She will need an echocardiogram sometime within the next month.  Our administrators are trying to work out what to do regarding the enormous medical bill.  The situation is complex.  I have asked her to find out when was the last time that her company paid the premiums to her insurance.  She knows that she had premiums withdrawn from her check all the way through her treatments until the company went bankrupt and that  she was in fact pain [employer] as well.  The question is to that money go to insurance after he was withdrawn or was induced for some company/fund.  If she can find that information she may be able to recover.  I have asked her to let us know if any problems develop before the next visit.  Magrinat, Virgie Dad, MD  08/12/17 4:14 PM Medical Oncology and Hematology [facility] [address] [place], [place] [address] Tel. [phone]    Fax. [phone]  Alice Rieger, am acting as scribe for Chauncey Cruel MD.  I, Lurline Del MD, have reviewed the above documentation for accuracy and completeness, and I agree with the above.

## 2017-08-11 NOTE — Telephone Encounter (Signed)
Per amy sch msg change inf time

## 2017-08-12 ENCOUNTER — Inpatient Hospital Stay: Payer: BC Managed Care – PPO

## 2017-08-12 ENCOUNTER — Inpatient Hospital Stay (HOSPITAL_BASED_OUTPATIENT_CLINIC_OR_DEPARTMENT_OTHER): Payer: BC Managed Care – PPO | Admitting: Oncology

## 2017-08-12 ENCOUNTER — Other Ambulatory Visit: Payer: BC Managed Care – PPO

## 2017-08-12 ENCOUNTER — Ambulatory Visit: Payer: BC Managed Care – PPO

## 2017-08-12 ENCOUNTER — Inpatient Hospital Stay: Payer: BC Managed Care – PPO | Attending: Oncology

## 2017-08-12 ENCOUNTER — Telehealth: Payer: Self-pay | Admitting: Oncology

## 2017-08-12 ENCOUNTER — Encounter (INDEPENDENT_AMBULATORY_CARE_PROVIDER_SITE_OTHER): Payer: Self-pay | Admitting: Family Medicine

## 2017-08-12 ENCOUNTER — Inpatient Hospital Stay: Payer: BC Managed Care – PPO | Admitting: Oncology

## 2017-08-12 DIAGNOSIS — Z171 Estrogen receptor negative status [ER-]: Secondary | ICD-10-CM

## 2017-08-12 DIAGNOSIS — Z5112 Encounter for antineoplastic immunotherapy: Secondary | ICD-10-CM | POA: Insufficient documentation

## 2017-08-12 DIAGNOSIS — C50411 Malignant neoplasm of upper-outer quadrant of right female breast: Secondary | ICD-10-CM | POA: Insufficient documentation

## 2017-08-12 DIAGNOSIS — C787 Secondary malignant neoplasm of liver and intrahepatic bile duct: Secondary | ICD-10-CM

## 2017-08-12 LAB — CBC WITH DIFFERENTIAL (CANCER CENTER ONLY)
Basophils Absolute: 0 10*3/uL (ref 0.0–0.1)
Basophils Relative: 0 %
Eosinophils Absolute: 0.1 10*3/uL (ref 0.0–0.5)
Eosinophils Relative: 2 %
HCT: 32.8 % — ABNORMAL LOW (ref 34.8–46.6)
Hemoglobin: 11.3 g/dL — ABNORMAL LOW (ref 11.6–15.9)
Lymphocytes Relative: 26 %
Lymphs Abs: 1.3 10*3/uL (ref 0.9–3.3)
MCH: 31.9 pg (ref 25.1–34.0)
MCHC: 34.5 g/dL (ref 31.5–36.0)
MCV: 92.7 fL (ref 79.5–101.0)
Monocytes Absolute: 0.4 10*3/uL (ref 0.1–0.9)
Monocytes Relative: 7 %
Neutro Abs: 3.3 10*3/uL (ref 1.5–6.5)
Neutrophils Relative %: 65 %
Platelet Count: 146 10*3/uL (ref 145–400)
RBC: 3.54 MIL/uL — ABNORMAL LOW (ref 3.70–5.45)
RDW: 13.3 % (ref 11.2–14.5)
WBC Count: 5 10*3/uL (ref 3.9–10.3)

## 2017-08-12 LAB — CMP (CANCER CENTER ONLY)
ALT: 82 U/L — ABNORMAL HIGH (ref 0–44)
AST: 42 U/L — ABNORMAL HIGH (ref 15–41)
Albumin: 4 g/dL (ref 3.5–5.0)
Alkaline Phosphatase: 106 U/L (ref 38–126)
Anion gap: 8 (ref 5–15)
BUN: 38 mg/dL — ABNORMAL HIGH (ref 8–23)
CO2: 25 mmol/L (ref 22–32)
Calcium: 9.4 mg/dL (ref 8.9–10.3)
Chloride: 108 mmol/L (ref 98–111)
Creatinine: 1.63 mg/dL — ABNORMAL HIGH (ref 0.44–1.00)
GFR, Est AFR Am: 37 mL/min — ABNORMAL LOW (ref >60)
GFR, Estimated: 32 mL/min — ABNORMAL LOW (ref >60)
Glucose, Bld: 86 mg/dL (ref 70–99)
Potassium: 4.3 mmol/L (ref 3.5–5.1)
Sodium: 141 mmol/L (ref 135–145)
Total Bilirubin: 0.6 mg/dL (ref 0.3–1.2)
Total Protein: 6.6 g/dL (ref 6.5–8.1)

## 2017-08-12 MED ORDER — HEPARIN SOD (PORK) LOCK FLUSH 100 UNIT/ML IV SOLN
500.0000 [IU] | Freq: Once | INTRAVENOUS | Status: DC | PRN
  Filled 2017-08-12: qty 5

## 2017-08-12 MED ORDER — DIPHENHYDRAMINE HCL 25 MG PO CAPS
25.0000 mg | ORAL_CAPSULE | Freq: Once | ORAL | Status: AC
  Administered 2017-08-12: 25 mg via ORAL

## 2017-08-12 MED ORDER — ACETAMINOPHEN 325 MG PO TABS
650.0000 mg | ORAL_TABLET | Freq: Once | ORAL | Status: AC
  Administered 2017-08-12: 650 mg via ORAL

## 2017-08-12 MED ORDER — SODIUM CHLORIDE 0.9 % IV SOLN
3.6000 mg/kg | Freq: Once | INTRAVENOUS | Status: AC
  Administered 2017-08-12: 320 mg via INTRAVENOUS
  Filled 2017-08-12: qty 16

## 2017-08-12 MED ORDER — SODIUM CHLORIDE 0.9% FLUSH
10.0000 mL | INTRAVENOUS | Status: DC | PRN
  Filled 2017-08-12: qty 10

## 2017-08-12 MED ORDER — SODIUM CHLORIDE 0.9 % IV SOLN
Freq: Once | INTRAVENOUS | Status: AC
  Administered 2017-08-12: 13:00:00 via INTRAVENOUS

## 2017-08-12 MED ORDER — DIPHENHYDRAMINE HCL 25 MG PO CAPS
ORAL_CAPSULE | ORAL | Status: AC
  Filled 2017-08-12: qty 1

## 2017-08-12 MED ORDER — ACETAMINOPHEN 325 MG PO TABS
ORAL_TABLET | ORAL | Status: AC
  Filled 2017-08-12: qty 2

## 2017-08-12 NOTE — Patient Instructions (Signed)
[facility] Discharge Instructions for Patients Receiving Chemotherapy  Today you received the following chemotherapy agents Kadcyla.  To help prevent nausea and vomiting after your treatment, we encourage you to take your nausea medication.   If you develop nausea and vomiting that is not controlled by your nausea medication, call the clinic.   BELOW ARE SYMPTOMS THAT SHOULD BE REPORTED IMMEDIATELY:  *FEVER GREATER THAN 100.5 F  *CHILLS WITH OR WITHOUT FEVER  NAUSEA AND VOMITING THAT IS NOT CONTROLLED WITH YOUR NAUSEA MEDICATION  *UNUSUAL SHORTNESS OF BREATH  *UNUSUAL BRUISING OR BLEEDING  TENDERNESS IN MOUTH AND THROAT WITH OR WITHOUT PRESENCE OF ULCERS  *URINARY PROBLEMS  *BOWEL PROBLEMS  UNUSUAL RASH Items with * indicate a potential emergency and should be followed up as soon as possible.  Feel free to call the clinic should you have any questions or concerns. The clinic phone number is [phone].  Please show the CHEMO ALERT CARD at check-in to the Emergency Department and triage nurse.  Ado-Trastuzumab Emtansine for injection What is this medicine? ADO-TRASTUZUMAB EMTANSINE (ADD oh traz TOO zuh mab em TAN zine) is a monoclonal antibody combined with chemotherapy. It is used to treat breast cancer. This medicine may be used for other purposes; ask your health care provider or pharmacist if you have questions. COMMON BRAND NAME(S): Kadcyla What should I tell my health care provider before I take this medicine? They need to know if you have any of these conditions: -heart disease -heart failure -infection (especially a virus infection such as chickenpox, cold sores, or herpes) -liver disease -lung or breathing disease, like asthma -an unusual or allergic reaction to ado-trastuzumab emtansine, other medications, foods, dyes, or preservatives -pregnant or trying to get pregnant -breast-feeding How should I use this medicine? This medicine is for  infusion into a vein. It is given by a health care professional in a hospital or clinic setting. Talk to your pediatrician regarding the use of this medicine in children. Special care may be needed. Overdosage: If you think you have taken too much of this medicine contact a poison control center or emergency room at once. NOTE: This medicine is only for you. Do not share this medicine with others. What if I miss a dose? It is important not to miss your dose. Call your doctor or health care professional if you are unable to keep an appointment. What may interact with this medicine? This medicine may also interact with the following medications: -atazanavir -boceprevir -clarithromycin -delavirdine -indinavir -dalfopristin; quinupristin -isoniazid, INH -itraconazole -ketoconazole -nefazodone -nelfinavir -ritonavir -telaprevir -telithromycin -tipranavir -voriconazole This list may not describe all possible interactions. Give your health care provider a list of all the medicines, herbs, non-prescription drugs, or dietary supplements you use. Also tell them if you smoke, drink alcohol, or use illegal drugs. Some items may interact with your medicine. What should I watch for while using this medicine? Visit your doctor for checks on your progress. This drug may make you feel generally unwell. This is not uncommon, as chemotherapy can affect healthy cells as well as cancer cells. Report any side effects. Continue your course of treatment even though you feel ill unless your doctor tells you to stop. You may need blood work done while you are taking this medicine. Call your doctor or health care professional for advice if you get a fever, chills or sore throat, or other symptoms of a cold or flu. Do not treat yourself. This drug decreases your body's ability to   fight infections. Try to avoid being around people who are sick. Be careful brushing and flossing your teeth or using a toothpick because  you may get an infection or bleed more easily. If you have any dental work done, tell your dentist you are receiving this medicine. Avoid taking products that contain aspirin, acetaminophen, ibuprofen, naproxen, or ketoprofen unless instructed by your doctor. These medicines may hide a fever. Do not become pregnant while taking this medicine or for 7 months after stopping it, men with female partners should use contraception during treatment and for 4 months after the last dose. Women should inform their doctor if they wish to become pregnant or think they might be pregnant. There is a potential for serious side effects to an unborn child. Do not breast-feed an infant while taking this medicine or for 7 months after the last dose. Men who have a partner who is pregnant or who is capable of becoming pregnant should use a condom during sexual activity while taking this medicine and for 4 months after stopping it. Men should inform their doctors if they wish to father a child. This medicine may lower sperm counts. Talk to your health care professional or pharmacist for more information. What side effects may I notice from receiving this medicine? Side effects that you should report to your doctor or health care professional as soon as possible: -allergic reactions like skin rash, itching or hives, swelling of the face, lips, or tongue -breathing problems -chest pain or palpitations -fever or chills, sore throat -general ill feeling or flu-like symptoms -light-colored stools -nausea, vomiting -pain, tingling, numbness in the hands or feet -signs and symptoms of bleeding such as bloody or black, tarry stools; red or dark-brown urine; spitting up blood or brown material that looks like coffee grounds; red spots on the skin; unusual bruising or bleeding from the eye, gums, or nose -swelling of the legs or ankles -yellowing of the eyes or skin Side effects that usually do not require medical attention  (report to your doctor or health care professional if they continue or are bothersome): -changes in taste -constipation -dizziness -headache -joint pain -muscle pain -trouble sleeping -unusually weak or tired This list may not describe all possible side effects. Call your doctor for medical advice about side effects. You may report side effects to FDA at 1-800-FDA-1088. Where should I keep my medicine? This drug is given in a hospital or clinic and will not be stored at home. NOTE: This sheet is a summary. It may not cover all possible information. If you have questions about this medicine, talk to your doctor, pharmacist, or health care provider.  2018 Elsevier/Gold Standard (2015-02-27 12:11:06)    

## 2017-08-12 NOTE — Telephone Encounter (Signed)
Scheduled appt per 7/23 los - pt aware of appts added - my chart active - no print out wanted.

## 2017-08-12 NOTE — Progress Notes (Unsigned)
Ok to treat with per Dr.Magrinat with Cr 1.63

## 2017-08-13 ENCOUNTER — Encounter: Payer: Self-pay | Admitting: Certified Nurse Midwife

## 2017-08-13 NOTE — Telephone Encounter (Signed)
Can you help with this?

## 2017-08-13 NOTE — Telephone Encounter (Signed)
Please get this patient in.

## 2017-08-13 NOTE — Telephone Encounter (Signed)
Patient scheduled for 08/14/17. April, [place]

## 2017-08-14 ENCOUNTER — Ambulatory Visit (INDEPENDENT_AMBULATORY_CARE_PROVIDER_SITE_OTHER): Payer: BC Managed Care – PPO | Admitting: Family Medicine

## 2017-08-14 VITALS — BP 95/60 | HR 73 | Temp 98.1°F | Ht 69.0 in | Wt 198.0 lb

## 2017-08-14 DIAGNOSIS — E1121 Type 2 diabetes mellitus with diabetic nephropathy: Secondary | ICD-10-CM

## 2017-08-14 DIAGNOSIS — Z9189 Other specified personal risk factors, not elsewhere classified: Secondary | ICD-10-CM | POA: Diagnosis not present

## 2017-08-14 DIAGNOSIS — E669 Obesity, unspecified: Secondary | ICD-10-CM | POA: Diagnosis not present

## 2017-08-14 DIAGNOSIS — Z794 Long term (current) use of insulin: Secondary | ICD-10-CM

## 2017-08-14 DIAGNOSIS — Z6830 Body mass index (BMI) 30.0-30.9, adult: Secondary | ICD-10-CM

## 2017-08-14 MED ORDER — METFORMIN HCL 500 MG PO TABS: 500.0000 mg | ORAL_TABLET | Freq: Every day | ORAL | 0 refills | Status: DC

## 2017-08-18 NOTE — Progress Notes (Signed)
Office: [phone]  /  Fax: [phone]   HPI:   Chief Complaint: OBESITY Malay is here to discuss her progress with her obesity treatment plan. She is on the portion control better and make smarter food choices, such as increase vegetables and decrease simple carbohydrates  and is following her eating plan approximately 60 % of the time. She states she is exercising 0 minutes 0 times per week. Shirlena notes increased emotional eating since her diagnosis of metastatic breast cancer with liver mets. She has questions about proper nutrition to maximize her chances of cure or remission. Her weight is 198 lb (89.8 kg) today and has had a weight loss of 1 pound over a period of 2 weeks since her last visit. She has lost 27 lbs since starting treatment with [name].  Diabetes II on insulin, with nephropathy Melynda has a diagnosis of diabetes type II. Shalissa forgot her blood sugar log and states fasting BGs range mostly between 88 and 100 and she denies any hypoglycemic episodes. Last A1c was well controlled at 6.1. She has been working on intensive lifestyle modifications including diet, exercise, and weight loss to help control her blood glucose levels.  At risk for cardiovascular disease Elisia is at a higher than average risk for cardiovascular disease due to obesity and diabetes. She currently denies any chest pain.  ALLERGIES: Allergies  Allergen Reactions  . Nsaids Other (See Comments)    H/o gastric bypass - avoid NSAIDs  . Tape Hives, Rash and Other (See Comments)    Adhesives in bandaids    MEDICATIONS: Current Outpatient Medications on File Prior to Visit  Medication Sig Dispense Refill  . acetaminophen (TYLENOL) 325 MG tablet Take 650 mg by mouth 2 (two) times daily as needed for moderate pain or headache.     [name] buPROPion (WELLBUTRIN SR) 200 MG 12 hr tablet Take 1 tablet (200 mg total) by mouth daily at 12 noon. 30 tablet 0  . CALCIUM CITRATE-VITAMIN D PO Take 1 tablet  by mouth 3 (three) times daily.     . cholecalciferol (VITAMIN D) 1000 units tablet Take 1,000 Units by mouth daily.    . Cyanocobalamin (VITAMIN B-12 PO) Take 1 tablet by mouth every 14 (fourteen) days.    . diphenhydrAMINE (BENADRYL) 25 MG tablet Take 25 mg by mouth daily as needed for allergies or sleep.     . hydrochlorothiazide (HYDRODIURIL) 25 MG tablet TAKE 1 TABLET BY MOUTH EVERY DAY 30 tablet 11  . Insulin Glargine (BASAGLAR KWIKPEN) 100 UNIT/ML SOPN Inject 0.16 mLs (16 Units total) into the skin at bedtime. (Patient taking differently: Inject 12 Units into the skin daily. ) 2 pen 0  . Insulin Pen Needle (BD PEN NEEDLE NANO U/F) 32G X 4 MM MISC 1 Package by Does not apply route daily at 12 noon. 100 each 0  . liraglutide (VICTOZA) 18 MG/3ML SOPN Inject 0.3 mLs (1.8 mg total) into the skin every morning. 3 pen 0  . losartan (COZAAR) 100 MG tablet Take 100 mg by mouth daily.     . Melatonin 3 MG CAPS Take 3 mg by mouth at bedtime.     Glory Rosebush DELICA LANCETS 70B MISC 1 Package by Does not apply route daily. 100 each 0  . Pediatric Multivitamins-Iron (FLINTSTONES PLUS IRON) chewable tablet Chew 1 tablet by mouth 2 (two) times daily.    . Probiotic Product ([employer] PROBIOTIC PO) Take 1 capsule by mouth daily.     [name] Propylene Glycol (  SYSTANE BALANCE OP) Place 1-2 drops into both eyes 3 (three) times daily as needed (for dry eyes).     [name] senna-docusate (SENNA PLUS) 8.6-50 MG tablet Take 1 tablet by mouth daily as needed for moderate constipation.     . topiramate (TOPAMAX) 25 MG tablet Take 1 tablet (25 mg total) by mouth daily. 6 tablet 0  . vortioxetine HBr (TRINTELLIX) 20 MG TABS Take 20 mg by mouth at bedtime. 30 tablet 0   No current facility-administered medications on file prior to visit.     PAST MEDICAL HISTORY: Past Medical History:  Diagnosis Date  . Anemia    hx of - during 1st round chemo  . Anxiety   . Arthritis    in fingers, shoulders  . Back pain   . Balance problem     . Breast cancer ([place])   . Breast cancer of upper-outer quadrant of right female breast ([place]) 07/22/2014  . Bunion, right foot   . Clumsiness   . Constipation   . Depression   . Diabetes mellitus without complication ([place])    77+ years  . Eating disorder    binge eating  . Epistaxis 08/26/2014  . Fatty liver   . HCAP (healthcare-associated pneumonia) 12/18/2014  . Headache   . Heart murmur    never had any problems  . History of radiation therapy 04/05/15-05/19/15   right chest wall was treated to 50.4 Gy in 28 fractions, right mastectomy scar was treated to 10 Gy in 5 fractions  . Hyperlipidemia   . Hypertension   . Joint pain   . Multiple food allergies   . Neuromuscular disorder ([place])    diabetic neuropathy  . Obesity   . Obstructive sleep apnea on CPAP    uses cpap nightly  . Ovarian cyst rupture    possible  . Pneumonia   . Pneumonia 11/2014  . Radiation 04/05/15-05/19/15   right chest wall 50.4 Gy, mastectomy scar 10 Gy  . Sleep apnea   . Thyroid nodule 06/2014    PAST SURGICAL HISTORY: Past Surgical History:  Procedure Laterality Date  . APPENDECTOMY    . BIOPSY THYROID Bilateral 06/2014   2 nodules - Benign   . BREAST LUMPECTOMY WITH RADIOACTIVE SEED AND SENTINEL LYMPH NODE BIOPSY Right 12/27/2014   Procedure: BREAST LUMPECTOMY WITH RADIOACTIVE SEED AND SENTINEL LYMPH NODE BIOPSY;  Surgeon: Stark Klein, MD;  Location: [facility];  Service: General;  Laterality: Right;  . BREAST REDUCTION SURGERY Bilateral 01/06/2015   Procedure: BILATERAL BREAST REDUCTION, RIGHT ONCOPLASTIC RECONSTRUCTION),LEFT BREAST REDUCTION FOR SYMMETRY;  Surgeon: [name], MD;  Location: [facility];  Service: Plastics;  Laterality: Bilateral;  . BREATH TEK H PYLORI N/A 09/15/2012   Procedure: BREATH TEK H PYLORI;  Surgeon: Pedro Earls, MD;  Location: [facility];  Service: General;  Laterality: N/A;  . BUNIONECTOMY Left 12/2013  . CARPAL TUNNEL RELEASE Right   . CARPAL  TUNNEL RELEASE Left   . CATARACT EXTRACTION     6/19  . COLONOSCOPY    . DUPUYTREN CONTRACTURE RELEASE    . GASTRIC ROUX-EN-Y N/A 11/02/2012   Procedure: LAPAROSCOPIC ROUX-EN-Y GASTRIC;  Surgeon: Pedro Earls, MD;  Location: [facility];  Service: General;  Laterality: N/A;  . IR GENERIC HISTORICAL  03/18/2016   IR US GUIDE VASC ACCESS LEFT 03/18/2016 Markus Daft, MD [facility]  . IR GENERIC HISTORICAL  03/18/2016   IR FLUORO GUIDE CV LINE LEFT 03/18/2016 Markus Daft, MD [facility]  . LAPAROSCOPIC APPENDECTOMY N/A 07/22/2015   Procedure: APPENDECTOMY LAPAROSCOPIC;  Surgeon: Michael Boston, MD;  Location: [facility];  Service: General;  Laterality: N/A;  . MASTECTOMY Right 02/15/2015   modified  . MODIFIED MASTECTOMY Right 02/15/2015   Procedure: RIGHT MODIFIED RADICAL MASTECTOMY;  Surgeon: Stark Klein, MD;  Location: [facility];  Service: General;  Laterality: Right;  . PORT-A-CATH REMOVAL Left 10/10/2015   Procedure: PORT REMOVAL;  Surgeon: Stark Klein, MD;  Location: [facility];  Service: General;  Laterality: Left;  . PORTACATH PLACEMENT Left 08/02/2014   Procedure: INSERTION PORT-A-CATH;  Surgeon: Stark Klein, MD;  Location: [facility];  Service: General;  Laterality: Left;  . PORTACATH PLACEMENT N/A 11/05/2016   Procedure: INSERTION PORT-A-CATH;  Surgeon: Stark Klein, MD;  Location: [facility];  Service: General;  Laterality: N/A;  . ROTATOR CUFF REPAIR Right 2005  . SCAR REVISION Right 12/08/2015   Procedure: COMPLEX REPAIR RIGHT CHEST 10-15CM;  Surgeon: [name], MD;  Location: [facility];  Service: Plastics;  Laterality: Right;  . TOE SURGERY  1970s   bone spur  . TRIGGER FINGER RELEASE     x3    SOCIAL HISTORY: Social History   Tobacco Use  . Smoking status: Never Smoker  . Smokeless tobacco: Never Used  Substance Use Topics  . Alcohol use: Yes    Alcohol/week: 0.6 oz    Types: 1 Standard drinks or equivalent per week    Comment: social  . Drug use:  No    FAMILY HISTORY: Family History  Problem Relation Age of Onset  . CVA Mother   . Heart disease Mother   . Sudden death Mother   . Diabetes Father   . Heart failure Father   . Hypertension Father   . Kidney disease Father   . Obesity Father   . Hypertension Sister   . Diabetes Brother   . Breast cancer Paternal Grandmother   . Diabetes Paternal Uncle     ROS: Review of Systems  Constitutional: Positive for weight loss.  Cardiovascular: Negative for chest pain.  Endo/Heme/Allergies:       Negative for hypoglycemia    PHYSICAL EXAM: Blood pressure 95/60, pulse 73, temperature 98.1 F (36.7 C), temperature source Oral, height 5\' 9"  (1.753 m), weight 198 lb (89.8 kg), last menstrual period 10/22/2007, SpO2 95 %. Body mass index is 29.24 kg/m. Physical Exam  Constitutional: She is oriented to person, place, and time. She appears well-developed and well-nourished.  Cardiovascular: Normal rate.  Pulmonary/Chest: Effort normal.  Musculoskeletal: Normal range of motion.  Neurological: She is oriented to person, place, and time.  Skin: Skin is warm and dry.  Psychiatric: She has a normal mood and affect. Her behavior is normal.  Vitals reviewed.   RECENT LABS AND TESTS: BMET    Component Value Date/Time   NA 141 08/12/2017 1132   NA 143 03/11/2017 0841   NA 140 01/07/2017 0758   K 4.3 08/12/2017 1132   K 4.1 01/07/2017 0758   CL 108 08/12/2017 1132   CO2 25 08/12/2017 1132   CO2 24 01/07/2017 0758   GLUCOSE 86 08/12/2017 1132   GLUCOSE 134 01/07/2017 0758   BUN 38 (H) 08/12/2017 1132   BUN 40 (H) 03/11/2017 0841   BUN 33.6 (H) 01/07/2017 0758   CREATININE 1.63 (H) 08/12/2017 1132   CREATININE 1.5 (H) 01/07/2017 0758   CALCIUM 9.4 08/12/2017 1132   CALCIUM 9.3 01/07/2017 0758   GFRNONAA 32 (  L) 08/12/2017 1132   GFRNONAA 55 (L) 02/18/2013 0827   GFRAA 37 (L) 08/12/2017 1132   GFRAA 63 02/18/2013 0827   Lab Results  Component Value Date   HGBA1C 6.1 (H)  03/11/2017   HGBA1C 8.2 (H) 11/05/2016   HGBA1C 8.1 (H) 08/09/2016   HGBA1C 6.5 (H) 01/03/2015   HGBA1C 6.9 (H) 02/18/2013   Lab Results  Component Value Date   INSULIN 15.3 03/11/2017   INSULIN 18.3 08/09/2016   CBC    Component Value Date/Time   WBC 5.0 08/12/2017 1132   WBC 5.4 07/30/2017 1222   RBC 3.54 (L) 08/12/2017 1132   HGB 11.3 (L) 08/12/2017 1132   HGB 10.8 (L) 01/07/2017 0758   HCT 32.8 (L) 08/12/2017 1132   HCT 31.9 (L) 01/07/2017 0758   PLT 146 08/12/2017 1132   PLT 126 (L) 01/07/2017 0758   MCV 92.7 08/12/2017 1132   MCV 90.8 01/07/2017 0758   MCH 31.9 08/12/2017 1132   MCHC 34.5 08/12/2017 1132   RDW 13.3 08/12/2017 1132   RDW 14.8 (H) 01/07/2017 0758   LYMPHSABS 1.3 08/12/2017 1132   LYMPHSABS 1.1 01/07/2017 0758   MONOABS 0.4 08/12/2017 1132   MONOABS 0.3 01/07/2017 0758   EOSABS 0.1 08/12/2017 1132   EOSABS 0.1 01/07/2017 0758   EOSABS 0.1 08/09/2016 1229   BASOSABS 0.0 08/12/2017 1132   BASOSABS 0.0 01/07/2017 0758   Iron/TIBC/Ferritin/ %Sat    Component Value Date/Time   IRON 58 02/18/2013 0827   TIBC 305 02/18/2013 0827   FERRITIN 133 10/31/2014 0816   IRONPCTSAT 19 (L) 02/18/2013 0827   Lipid Panel     Component Value Date/Time   CHOL 164 02/07/2017   CHOL 182 08/09/2016 1229   TRIG 114 02/07/2017   HDL 59 02/07/2017   HDL 62 08/09/2016 1229   CHOLHDL 2.9 02/18/2013 0827   VLDL 23 02/18/2013 0827   LDLCALC 83 02/07/2017   LDLCALC 85 08/09/2016 1229   Hepatic Function Panel     Component Value Date/Time   PROT 6.6 08/12/2017 1132   PROT 6.9 03/11/2017 0841   PROT 6.7 01/07/2017 0758   ALBUMIN 4.0 08/12/2017 1132   ALBUMIN 4.5 03/11/2017 0841   ALBUMIN 3.6 01/07/2017 0758   AST 42 (H) 08/12/2017 1132   AST 51 (H) 01/07/2017 0758   ALT 82 (H) 08/12/2017 1132   ALT 82 (H) 01/07/2017 0758   ALKPHOS 106 08/12/2017 1132   ALKPHOS 115 01/07/2017 0758   BILITOT 0.6 08/12/2017 1132   BILITOT 0.72 01/07/2017 0758        Component Value Date/Time   TSH 1.54 02/07/2017   TSH 2.170 08/09/2016 1229   Results for Lizardo, Chinmayi L "[name]" (MRN [id number]) as of 08/18/2017 10:41  Ref. Range 03/11/2017 08:41  Vitamin D, 25-Hydroxy Latest Ref Range: 30.0 - 100.0 ng/mL 60.4   ASSESSMENT AND PLAN: Type 2 diabetes mellitus with diabetic nephropathy, with long-term current use of insulin (HCC) - Plan: metFORMIN (GLUCOPHAGE) 500 MG tablet  At risk for heart disease  Class 1 obesity with serious comorbidity and body mass index (BMI) of 30.0 to 30.9 in adult, unspecified obesity type - Starting BMI greater then 30  PLAN:  Diabetes II on insulin, with nephropathy Chiquitta has been given extensive diabetes education by myself today including ideal fasting and post-prandial blood glucose readings, individual ideal Hgb A1c goals and hypoglycemia prevention. We discussed the importance of good blood sugar control to help with overall health and decrease inflammation while  fighting metastatic breast cancer. We discussed the importance of intensive lifestyle modification including diet, exercise and weight loss as the first line treatment for diabetes. Emika agrees to continue Victoza, Metformin and Engineer, agricultural. We will refill metformin for 1 month and Minnette agrees to follow up at the agreed upon time.  Cardiovascular risk counseling Mannat was given extended (30 minutes) coronary artery disease prevention counseling today. She is 67 y.o. female and has risk factors for heart disease including obesity and diabetes. We discussed intensive lifestyle modifications today with an emphasis on specific weight loss instructions and strategies. Pt was also informed of the importance of increasing exercise and decreasing saturated fats to help prevent heart disease.  Obesity Timisha is currently in the action stage of change. As such, her goal is to continue with weight loss efforts She has agreed to keep a food journal with 1300  to 1500 calories and 80+ grams of protein daily Richel has been instructed to work up to a goal of 150 minutes of combined cardio and strengthening exercise per week or walking for 30 minutes 5 times per week for weight loss and overall health benefits. We discussed the following Behavioral Modification Strategies today: increasing vegetables, increase high fiber foods, keeping healthy foods in the house and better snacking choices. We had a long discussion about foods with antioxidants and maintaining muscle mass.  Khylei has agreed to follow up with our clinic in 2 to 3 weeks. She was informed of the importance of frequent follow up visits to maximize her success with intensive lifestyle modifications for her multiple health conditions.   OBESITY BEHAVIORAL INTERVENTION VISIT  Today's visit was # 22 out of 22.  Starting weight: 225 lbs Starting date: 08/09/16 Today's weight : 198 lbs  Today's date: 08/14/2017 Total lbs lost to date: 45    ASK: We discussed the diagnosis of obesity with [name] today and [name] agreed to give [name] permission to discuss obesity behavioral modification therapy today.  ASSESS: Atianna has the diagnosis of obesity and her BMI today is 29.23 Pammy is in the action stage of change   ADVISE: Kamika was educated on the multiple health risks of obesity as well as the benefit of weight loss to improve her health. She was advised of the need for long term treatment and the importance of lifestyle modifications.  AGREE: Multiple dietary modification options and treatment options were discussed and  Bernetta agreed to the above obesity treatment plan.  I, Doreene Nest, am acting as transcriptionist for Dennard Nip, MD  I have reviewed the above documentation for accuracy and completeness, and I agree with the above. -Dennard Nip, MD

## 2017-08-20 ENCOUNTER — Ambulatory Visit (INDEPENDENT_AMBULATORY_CARE_PROVIDER_SITE_OTHER): Payer: Self-pay | Admitting: Family Medicine

## 2017-08-23 ENCOUNTER — Other Ambulatory Visit (INDEPENDENT_AMBULATORY_CARE_PROVIDER_SITE_OTHER): Payer: Self-pay | Admitting: Family Medicine

## 2017-08-23 DIAGNOSIS — F3289 Other specified depressive episodes: Secondary | ICD-10-CM

## 2017-08-28 ENCOUNTER — Other Ambulatory Visit (INDEPENDENT_AMBULATORY_CARE_PROVIDER_SITE_OTHER): Payer: Self-pay | Admitting: Family Medicine

## 2017-08-28 DIAGNOSIS — Z794 Long term (current) use of insulin: Secondary | ICD-10-CM

## 2017-08-28 DIAGNOSIS — E119 Type 2 diabetes mellitus without complications: Secondary | ICD-10-CM

## 2017-09-01 ENCOUNTER — Inpatient Hospital Stay: Payer: BC Managed Care – PPO

## 2017-09-01 ENCOUNTER — Other Ambulatory Visit (INDEPENDENT_AMBULATORY_CARE_PROVIDER_SITE_OTHER): Payer: Self-pay | Admitting: Family Medicine

## 2017-09-01 ENCOUNTER — Inpatient Hospital Stay (HOSPITAL_BASED_OUTPATIENT_CLINIC_OR_DEPARTMENT_OTHER): Payer: BC Managed Care – PPO | Admitting: Adult Health

## 2017-09-01 ENCOUNTER — Encounter: Payer: Self-pay | Admitting: Adult Health

## 2017-09-01 ENCOUNTER — Inpatient Hospital Stay: Payer: BC Managed Care – PPO | Attending: Oncology

## 2017-09-01 VITALS — BP 116/48 | HR 70 | Temp 98.0°F | Resp 18 | Ht 69.0 in | Wt 198.8 lb

## 2017-09-01 DIAGNOSIS — E119 Type 2 diabetes mellitus without complications: Secondary | ICD-10-CM

## 2017-09-01 DIAGNOSIS — C50411 Malignant neoplasm of upper-outer quadrant of right female breast: Secondary | ICD-10-CM

## 2017-09-01 DIAGNOSIS — Z171 Estrogen receptor negative status [ER-]: Secondary | ICD-10-CM

## 2017-09-01 DIAGNOSIS — Z5112 Encounter for antineoplastic immunotherapy: Secondary | ICD-10-CM | POA: Diagnosis not present

## 2017-09-01 DIAGNOSIS — Z794 Long term (current) use of insulin: Secondary | ICD-10-CM

## 2017-09-01 DIAGNOSIS — Z95828 Presence of other vascular implants and grafts: Secondary | ICD-10-CM

## 2017-09-01 DIAGNOSIS — C787 Secondary malignant neoplasm of liver and intrahepatic bile duct: Secondary | ICD-10-CM

## 2017-09-01 LAB — CMP (CANCER CENTER ONLY)
ALT: 76 U/L — ABNORMAL HIGH (ref 0–44)
AST: 55 U/L — ABNORMAL HIGH (ref 15–41)
Albumin: 4 g/dL (ref 3.5–5.0)
Alkaline Phosphatase: 127 U/L — ABNORMAL HIGH (ref 38–126)
Anion gap: 12 (ref 5–15)
BUN: 50 mg/dL — ABNORMAL HIGH (ref 8–23)
CO2: 20 mmol/L — ABNORMAL LOW (ref 22–32)
Calcium: 9.7 mg/dL (ref 8.9–10.3)
Chloride: 108 mmol/L (ref 98–111)
Creatinine: 1.92 mg/dL — ABNORMAL HIGH (ref 0.44–1.00)
GFR, Est AFR Am: 31 mL/min — ABNORMAL LOW (ref >60)
GFR, Estimated: 26 mL/min — ABNORMAL LOW (ref >60)
Glucose, Bld: 126 mg/dL — ABNORMAL HIGH (ref 70–99)
Potassium: 3.6 mmol/L (ref 3.5–5.1)
Sodium: 140 mmol/L (ref 135–145)
Total Bilirubin: 0.7 mg/dL (ref 0.3–1.2)
Total Protein: 7.1 g/dL (ref 6.5–8.1)

## 2017-09-01 LAB — CBC WITH DIFFERENTIAL (CANCER CENTER ONLY)
Basophils Absolute: 0 10*3/uL (ref 0.0–0.1)
Basophils Relative: 0 %
Eosinophils Absolute: 0.1 10*3/uL (ref 0.0–0.5)
Eosinophils Relative: 2 %
HCT: 33.1 % — ABNORMAL LOW (ref 34.8–46.6)
Hemoglobin: 11.1 g/dL — ABNORMAL LOW (ref 11.6–15.9)
Lymphocytes Relative: 24 %
Lymphs Abs: 1.1 10*3/uL (ref 0.9–3.3)
MCH: 31.4 pg (ref 25.1–34.0)
MCHC: 33.5 g/dL (ref 31.5–36.0)
MCV: 93.5 fL (ref 79.5–101.0)
Monocytes Absolute: 0.4 10*3/uL (ref 0.1–0.9)
Monocytes Relative: 8 %
Neutro Abs: 3 10*3/uL (ref 1.5–6.5)
Neutrophils Relative %: 66 %
Platelet Count: 110 10*3/uL — ABNORMAL LOW (ref 145–400)
RBC: 3.54 MIL/uL — ABNORMAL LOW (ref 3.70–5.45)
RDW: 13.6 % (ref 11.2–14.5)
WBC Count: 4.7 10*3/uL (ref 3.9–10.3)

## 2017-09-01 MED ORDER — HEPARIN SOD (PORK) LOCK FLUSH 100 UNIT/ML IV SOLN
500.0000 [IU] | Freq: Once | INTRAVENOUS | Status: AC | PRN
  Administered 2017-09-01: 500 [IU]
  Filled 2017-09-01: qty 5

## 2017-09-01 MED ORDER — ACETAMINOPHEN 325 MG PO TABS
650.0000 mg | ORAL_TABLET | Freq: Once | ORAL | Status: AC
  Administered 2017-09-01: 650 mg via ORAL

## 2017-09-01 MED ORDER — DIPHENHYDRAMINE HCL 25 MG PO CAPS
ORAL_CAPSULE | ORAL | Status: AC
  Filled 2017-09-01: qty 2

## 2017-09-01 MED ORDER — SODIUM CHLORIDE 0.9% FLUSH
10.0000 mL | INTRAVENOUS | Status: DC | PRN
  Administered 2017-09-01: 10 mL
  Filled 2017-09-01: qty 10

## 2017-09-01 MED ORDER — DIPHENHYDRAMINE HCL 25 MG PO CAPS
25.0000 mg | ORAL_CAPSULE | Freq: Once | ORAL | Status: AC
  Administered 2017-09-01: 25 mg via ORAL

## 2017-09-01 MED ORDER — SODIUM CHLORIDE 0.9 % IV SOLN
Freq: Once | INTRAVENOUS | Status: AC
  Administered 2017-09-01: 10:00:00 via INTRAVENOUS
  Filled 2017-09-01: qty 250

## 2017-09-01 MED ORDER — SODIUM CHLORIDE 0.9% FLUSH
10.0000 mL | INTRAVENOUS | Status: DC | PRN
  Administered 2017-09-01: 10 mL via INTRAVENOUS
  Filled 2017-09-01: qty 10

## 2017-09-01 MED ORDER — ACETAMINOPHEN 325 MG PO TABS
ORAL_TABLET | ORAL | Status: AC
  Filled 2017-09-01: qty 2

## 2017-09-01 MED ORDER — SODIUM CHLORIDE 0.9 % IV SOLN
3.6000 mg/kg | Freq: Once | INTRAVENOUS | Status: AC
  Administered 2017-09-01: 320 mg via INTRAVENOUS
  Filled 2017-09-01: qty 16

## 2017-09-01 NOTE — Patient Instructions (Signed)
[facility] Discharge Instructions for Patients Receiving Chemotherapy  Today you received the following chemotherapy agents Kadcyla.  To help prevent nausea and vomiting after your treatment, we encourage you to take your nausea medication.   If you develop nausea and vomiting that is not controlled by your nausea medication, call the clinic.   BELOW ARE SYMPTOMS THAT SHOULD BE REPORTED IMMEDIATELY:  *FEVER GREATER THAN 100.5 F  *CHILLS WITH OR WITHOUT FEVER  NAUSEA AND VOMITING THAT IS NOT CONTROLLED WITH YOUR NAUSEA MEDICATION  *UNUSUAL SHORTNESS OF BREATH  *UNUSUAL BRUISING OR BLEEDING  TENDERNESS IN MOUTH AND THROAT WITH OR WITHOUT PRESENCE OF ULCERS  *URINARY PROBLEMS  *BOWEL PROBLEMS  UNUSUAL RASH Items with * indicate a potential emergency and should be followed up as soon as possible.  Feel free to call the clinic should you have any questions or concerns. The clinic phone number is [phone].  Please show the CHEMO ALERT CARD at check-in to the Emergency Department and triage nurse.  Ado-Trastuzumab Emtansine for injection What is this medicine? ADO-TRASTUZUMAB EMTANSINE (ADD oh traz TOO zuh mab em TAN zine) is a monoclonal antibody combined with chemotherapy. It is used to treat breast cancer. This medicine may be used for other purposes; ask your health care provider or pharmacist if you have questions. COMMON BRAND NAME(S): Kadcyla What should I tell my health care provider before I take this medicine? They need to know if you have any of these conditions: -heart disease -heart failure -infection (especially a virus infection such as chickenpox, cold sores, or herpes) -liver disease -lung or breathing disease, like asthma -an unusual or allergic reaction to ado-trastuzumab emtansine, other medications, foods, dyes, or preservatives -pregnant or trying to get pregnant -breast-feeding How should I use this medicine? This medicine is for  infusion into a vein. It is given by a health care professional in a hospital or clinic setting. Talk to your pediatrician regarding the use of this medicine in children. Special care may be needed. Overdosage: If you think you have taken too much of this medicine contact a poison control center or emergency room at once. NOTE: This medicine is only for you. Do not share this medicine with others. What if I miss a dose? It is important not to miss your dose. Call your doctor or health care professional if you are unable to keep an appointment. What may interact with this medicine? This medicine may also interact with the following medications: -atazanavir -boceprevir -clarithromycin -delavirdine -indinavir -dalfopristin; quinupristin -isoniazid, INH -itraconazole -ketoconazole -nefazodone -nelfinavir -ritonavir -telaprevir -telithromycin -tipranavir -voriconazole This list may not describe all possible interactions. Give your health care provider a list of all the medicines, herbs, non-prescription drugs, or dietary supplements you use. Also tell them if you smoke, drink alcohol, or use illegal drugs. Some items may interact with your medicine. What should I watch for while using this medicine? Visit your doctor for checks on your progress. This drug may make you feel generally unwell. This is not uncommon, as chemotherapy can affect healthy cells as well as cancer cells. Report any side effects. Continue your course of treatment even though you feel ill unless your doctor tells you to stop. You may need blood work done while you are taking this medicine. Call your doctor or health care professional for advice if you get a fever, chills or sore throat, or other symptoms of a cold or flu. Do not treat yourself. This drug decreases your body's ability to   fight infections. Try to avoid being around people who are sick. Be careful brushing and flossing your teeth or using a toothpick because  you may get an infection or bleed more easily. If you have any dental work done, tell your dentist you are receiving this medicine. Avoid taking products that contain aspirin, acetaminophen, ibuprofen, naproxen, or ketoprofen unless instructed by your doctor. These medicines may hide a fever. Do not become pregnant while taking this medicine or for 7 months after stopping it, men with female partners should use contraception during treatment and for 4 months after the last dose. Women should inform their doctor if they wish to become pregnant or think they might be pregnant. There is a potential for serious side effects to an unborn child. Do not breast-feed an infant while taking this medicine or for 7 months after the last dose. Men who have a partner who is pregnant or who is capable of becoming pregnant should use a condom during sexual activity while taking this medicine and for 4 months after stopping it. Men should inform their doctors if they wish to father a child. This medicine may lower sperm counts. Talk to your health care professional or pharmacist for more information. What side effects may I notice from receiving this medicine? Side effects that you should report to your doctor or health care professional as soon as possible: -allergic reactions like skin rash, itching or hives, swelling of the face, lips, or tongue -breathing problems -chest pain or palpitations -fever or chills, sore throat -general ill feeling or flu-like symptoms -light-colored stools -nausea, vomiting -pain, tingling, numbness in the hands or feet -signs and symptoms of bleeding such as bloody or black, tarry stools; red or dark-brown urine; spitting up blood or brown material that looks like coffee grounds; red spots on the skin; unusual bruising or bleeding from the eye, gums, or nose -swelling of the legs or ankles -yellowing of the eyes or skin Side effects that usually do not require medical attention  (report to your doctor or health care professional if they continue or are bothersome): -changes in taste -constipation -dizziness -headache -joint pain -muscle pain -trouble sleeping -unusually weak or tired This list may not describe all possible side effects. Call your doctor for medical advice about side effects. You may report side effects to FDA at 1-800-FDA-1088. Where should I keep my medicine? This drug is given in a hospital or clinic and will not be stored at home. NOTE: This sheet is a summary. It may not cover all possible information. If you have questions about this medicine, talk to your doctor, pharmacist, or health care provider.  2018 [employer] (2015-02-27 12:11:06)    

## 2017-09-01 NOTE — Progress Notes (Signed)
[place]  [phone] [phone]) [phone]   ID: [name] DOB: [birth year]  MR#: [id number]  WYO#:[id number]  Patient Care Team: Vernie Shanks, MD as PCP - General (Family Medicine) Himmelrich, Bryson Ha, RD (Inactive) as Dietitian (Bariatrics) Stark Klein, MD as Consulting Physician (General Surgery) Magrinat, Virgie Dad, MD as Consulting Physician (Oncology) Arloa Koh, MD as Consulting Physician (Radiation Oncology) Mauro Kaufmann, RN as Registered Nurse Rockwell Germany, RN as Registered Nurse Jacelyn Pi, MD as Consulting Physician (Endocrinology) Kem Boroughs, [place] as Nurse Practitioner (Family Medicine) [name], MD as Consulting Physician (Plastic Surgery) Larey Dresser, MD as Consulting Physician (Cardiology) Gery Pray, MD as Consulting Physician (Radiation Oncology) PCP: Vernie Shanks, MD OTHER MD:  CHIEF COMPLAINT:  HER-2 positive, estrogen receptor negative locally recurrent disease  CURRENT TREATMENT:  T-DM1  INTERVAL HISTORY:  Darlene [name] returns today for follow-up and treatment of her estrogen receptor negative but HER-2 amplified breast cancer accompanied by her husband. Today is day 1 cycle 2 of Kadcyla given every 21 days.  Her most recent echocardiogram was 04/24/2017 and showed an ejection fraction of 55-60%. She is followed by Dr. Aundra Dubin every 6 months at this point per his April, 2019 note.    REVIEW OF SYSTEMS: Darlene [name] is doing well today.  She notes that she is undergoing physician assisted weight loss with Dr. Leafy Ro and is very happy that she is under 200 pounds!  She has noted that with her weight loss that her blood pressure is lower.  She denies any dizziness, but wonders if her blood pressure medication should be decreased.  She sees Dr. Aundra Dubin in cardiology, who is following her cardiac function during her Trastuzumab and TDM1 treatments.  He started the medication.    Darlene [name] denies any new  issues.  She is without cough, shortness of breath, chest pain, palpitations, swelling, dysphagia, nausea, vomiting, constipation, diarrhea, or any other concerns.  A detailed ROS was non contributory today.   BREAST CANCER HISTORY: From the original intake note:  "Darlene [name]" had screening mammography at her gynecologist suggestive of a possible abnormality in the right breast. However right diagnostic mammogram 04/12/2013 at [facility] was negative. More recently however, she noted a lump in her right breast and brought it to her gynecologist's attention. On 07/18/2014 the patient had bilateral diagnostic mammography with tomosynthesis at [facility]. The breast density was category B. In the right breast upper outer quadrant there was an area of focal asymmetry with indistinct margins.[name] Ultrasound was obtained and showed in addition to the mass in question, measuring 1.7 cm, an abnormal-appearing lymph node in the right axillary tail.  On 07/19/2014 the patient underwent biopsy of the right breast massin question as well as a right axillary lymph node. Both were positive for invasive ductal carcinoma, grade 2, estrogen and progesterone receptor negative, with an MIB-1 of 90%, and HER-2 pending. Incidentally the lymph node biopsy showed a lymphocytic inflammatory component but no lymph node architecture.  Her subsequent history is as detailed below.  PAST MEDICAL HISTORY: Past Medical History:  Diagnosis Date  . Anemia    hx of - during 1st round chemo  . Anxiety   . Arthritis    in fingers, shoulders  . Back pain   . Balance problem   . Breast cancer ([place])   . Breast cancer of upper-outer quadrant of right female breast ([place]) 07/22/2014  . Bunion, right foot   . Clumsiness   . Constipation   .  Depression   . Diabetes mellitus without complication ([place])    99+ years  . Eating disorder    binge eating  . Epistaxis 08/26/2014  . Fatty liver   . HCAP (healthcare-associated pneumonia) 12/18/2014  .  Headache   . Heart murmur    never had any problems  . History of radiation therapy 04/05/15-05/19/15   right chest wall was treated to 50.4 Gy in 28 fractions, right mastectomy scar was treated to 10 Gy in 5 fractions  . Hyperlipidemia   . Hypertension   . Joint pain   . Multiple food allergies   . Neuromuscular disorder ([place])    diabetic neuropathy  . Obesity   . Obstructive sleep apnea on CPAP    uses cpap nightly  . Ovarian cyst rupture    possible  . Pneumonia   . Pneumonia 11/2014  . Radiation 04/05/15-05/19/15   right chest wall 50.4 Gy, mastectomy scar 10 Gy  . Sleep apnea   . Thyroid nodule 06/2014    PAST SURGICAL HISTORY: Past Surgical History:  Procedure Laterality Date  . APPENDECTOMY    . BIOPSY THYROID Bilateral 06/2014   2 nodules - Benign   . BREAST LUMPECTOMY WITH RADIOACTIVE SEED AND SENTINEL LYMPH NODE BIOPSY Right 12/27/2014   Procedure: BREAST LUMPECTOMY WITH RADIOACTIVE SEED AND SENTINEL LYMPH NODE BIOPSY;  Surgeon: Stark Klein, MD;  Location: [place];  Service: General;  Laterality: Right;  . BREAST REDUCTION SURGERY Bilateral 01/06/2015   Procedure: BILATERAL BREAST REDUCTION, RIGHT ONCOPLASTIC RECONSTRUCTION),LEFT BREAST REDUCTION FOR SYMMETRY;  Surgeon: [name], MD;  Location: [place];  Service: Plastics;  Laterality: Bilateral;  . BREATH TEK H PYLORI N/A 09/15/2012   Procedure: BREATH TEK H PYLORI;  Surgeon: Pedro Earls, MD;  Location: [facility];  Service: General;  Laterality: N/A;  . BUNIONECTOMY Left 12/2013  . CARPAL TUNNEL RELEASE Right   . CARPAL TUNNEL RELEASE Left   . CATARACT EXTRACTION     6/19  . COLONOSCOPY    . DUPUYTREN CONTRACTURE RELEASE    . GASTRIC ROUX-EN-Y N/A 11/02/2012   Procedure: LAPAROSCOPIC ROUX-EN-Y GASTRIC;  Surgeon: Pedro Earls, MD;  Location: [facility];  Service: General;  Laterality: N/A;  . IR GENERIC HISTORICAL  03/18/2016   IR US GUIDE VASC ACCESS LEFT 03/18/2016 Markus Daft, MD  [facility]  . IR GENERIC HISTORICAL  03/18/2016   IR FLUORO GUIDE CV LINE LEFT 03/18/2016 Markus Daft, MD [facility]  . LAPAROSCOPIC APPENDECTOMY N/A 07/22/2015   Procedure: APPENDECTOMY LAPAROSCOPIC;  Surgeon: Michael Boston, MD;  Location: [facility];  Service: General;  Laterality: N/A;  . MASTECTOMY Right 02/15/2015   modified  . MODIFIED MASTECTOMY Right 02/15/2015   Procedure: RIGHT MODIFIED RADICAL MASTECTOMY;  Surgeon: Stark Klein, MD;  Location: [place];  Service: General;  Laterality: Right;  . PORT-A-CATH REMOVAL Left 10/10/2015   Procedure: PORT REMOVAL;  Surgeon: Stark Klein, MD;  Location: [place];  Service: General;  Laterality: Left;  . PORTACATH PLACEMENT Left 08/02/2014   Procedure: INSERTION PORT-A-CATH;  Surgeon: Stark Klein, MD;  Location: [place];  Service: General;  Laterality: Left;  . PORTACATH PLACEMENT N/A 11/05/2016   Procedure: INSERTION PORT-A-CATH;  Surgeon: Stark Klein, MD;  Location: [place];  Service: General;  Laterality: N/A;  . ROTATOR CUFF REPAIR Right 2005  . SCAR REVISION Right 12/08/2015   Procedure: COMPLEX REPAIR RIGHT CHEST 10-15CM;  Surgeon: [name], MD;  Location: [place];  Service: Clinical cytogeneticist;  Laterality: Right;  . TOE SURGERY  1970s   bone spur  . TRIGGER FINGER RELEASE     x3    FAMILY HISTORY Family History  Problem Relation Age of Onset  . CVA Mother   . Heart disease Mother   . Sudden death Mother   . Diabetes Father   . Heart failure Father   . Hypertension Father   . Kidney disease Father   . Obesity Father   . Hypertension Sister   . Diabetes Brother   . Breast cancer Paternal Grandmother   . Diabetes Paternal Uncle    The patient's father died from complications of diabetes at age 42. The patient's mother died at age 30 with a subarachnoid hemorrhage. The patient had one brother, one sister. The only breast cancer was the patient's paternal grandmother diagnosed in her 82s. There is no  history of ovarian cancer in the family.  GYNECOLOGIC HISTORY:  Patient's last menstrual period was 10/22/2007.  menarche age 4, the patient is GX P0. She went through the change of life in 2011. She did not take hormone replacement.  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 Her husband Darlene [name] is a retired Visual merchandiser (he taught at [employer]).  Darlene [name] has 2 children from an earlier marriage, Rockey Situ who teaches in [place], and Layza Summa,  who works at the [employer] in in [place]. He has 1 grand son, aged 30. The patient and her husband are not church attenders.    ADVANCED DIRECTIVES:  Not in place   HEALTH MAINTENANCE: Social History   Tobacco Use  . Smoking status: Never Smoker  . Smokeless tobacco: Never Used  Substance Use Topics  . Alcohol use: Yes    Alcohol/week: 1.0 standard drinks    Types: 1 Standard drinks or equivalent per week    Comment: social  . Drug use: No     Colonoscopy:  May 2016  PAP:  Bone density: 07/01/2016 at [facility] showed normal, with a T score of -0.7  Lipid panel:  Allergies  Allergen Reactions  . Nsaids Other (See Comments)    H/o gastric bypass - avoid NSAIDs  . Tape Hives, Rash and Other (See Comments)    Adhesives in bandaids    Current Outpatient Medications  Medication Sig Dispense Refill  . acetaminophen (TYLENOL) 325 MG tablet Take 650 mg by mouth 2 (two) times daily as needed for moderate pain or headache.     [name] buPROPion (WELLBUTRIN SR) 200 MG 12 hr tablet TAKE 1 TABLET BY MOUTH DAILY AT 12 NOON. 30 tablet 0  . CALCIUM CITRATE-VITAMIN D PO Take 1 tablet by mouth 3 (three) times daily.     . cholecalciferol (VITAMIN D) 1000 units tablet Take 1,000 Units by mouth daily.    . Cyanocobalamin (VITAMIN B-12 PO) Take 1 tablet by mouth every 14 (fourteen) days.    . diphenhydrAMINE  (BENADRYL) 25 MG tablet Take 25 mg by mouth daily as needed for allergies or sleep.     . hydrochlorothiazide (HYDRODIURIL) 25 MG tablet TAKE 1 TABLET BY MOUTH EVERY DAY 30 tablet 11  . Insulin Glargine (BASAGLAR KWIKPEN) 100 UNIT/ML SOPN Inject 0.16 mLs (16 Units total) into the skin at bedtime. (Patient taking differently: Inject 12 Units into the skin daily. ) 2 pen 0  . Insulin Pen Needle (  BD PEN NEEDLE NANO U/F) 32G X 4 MM MISC 1 Package by Does not apply route daily at 12 noon. 100 each 0  . liraglutide (VICTOZA) 18 MG/3ML SOPN Inject 0.3 mLs (1.8 mg total) into the skin every morning. 3 pen 0  . losartan (COZAAR) 100 MG tablet Take 100 mg by mouth daily.     . Melatonin 3 MG CAPS Take 3 mg by mouth at bedtime.     . metFORMIN (GLUCOPHAGE) 500 MG tablet Take 1 tablet (500 mg total) by mouth daily. 30 tablet 0  . ONETOUCH DELICA LANCETS 26R MISC 1 Package by Does not apply route daily. 100 each 0  . Pediatric Multivitamins-Iron (FLINTSTONES PLUS IRON) chewable tablet Chew 1 tablet by mouth 2 (two) times daily.    . Probiotic Product (CVS PROBIOTIC PO) Take 1 capsule by mouth daily.     [name] Propylene Glycol (SYSTANE BALANCE OP) Place 1-2 drops into both eyes 3 (three) times daily as needed (for dry eyes).     [name] senna-docusate (SENNA PLUS) 8.6-50 MG tablet Take 1 tablet by mouth daily as needed for moderate constipation.     . topiramate (TOPAMAX) 25 MG tablet TAKE 1 TABLET BY MOUTH EVERY DAY 30 tablet 0  . vortioxetine HBr (TRINTELLIX) 20 MG TABS Take 20 mg by mouth at bedtime. 30 tablet 0   Current Facility-Administered Medications  Medication Dose Route Frequency Provider Last Rate Last Dose  . sodium chloride flush (NS) 0.9 % injection 10 mL  10 mL Intravenous PRN Magrinat, Virgie Dad, MD   10 mL at 09/01/17 0901   Facility-Administered Medications Ordered in Other Visits  Medication Dose Route Frequency Provider Last Rate Last Dose  . ado-trastuzumab emtansine (KADCYLA) 320 mg in sodium  chloride 0.9 % 250 mL chemo infusion  3.6 mg/kg (Treatment Plan Recorded) Intravenous Once Magrinat, Virgie Dad, MD 532 mL/hr at 09/01/17 1114 320 mg at 09/01/17 1114  . heparin lock flush 100 unit/mL  500 Units Intracatheter Once PRN Magrinat, Virgie Dad, MD      . sodium chloride flush (NS) 0.9 % injection 10 mL  10 mL Intracatheter PRN Magrinat, Virgie Dad, MD        OBJECTIVE:  Vitals:   09/01/17 0830  BP: (!) 116/48  Pulse: 70  Resp: 18  Temp: 98 F (36.7 C)  SpO2: 98%     Body mass index is 29.36 kg/m.    ECOG FS:1 - Symptomatic but completely ambulatory Filed Weights   09/01/17 0830  Weight: 198 lb 12.8 oz (90.2 kg)   GENERAL: Patient is a well appearing female in no acute distress HEENT:  Sclerae anicteric.  Oropharynx clear and moist. No ulcerations or evidence of oropharyngeal candidiasis. Neck is supple.  NODES:  No cervical, supraclavicular, or axillary lymphadenopathy palpated.  BREAST EXAM:  Deferred. LUNGS:  Clear to auscultation bilaterally.  No wheezes or rhonchi. HEART:  Regular rate and rhythm. No murmur appreciated. ABDOMEN:  Soft, nontender.  Positive, normoactive bowel sounds. No organomegaly palpated. MSK:  No focal spinal tenderness to palpation. Full range of motion bilaterally in the upper extremities. EXTREMITIES:  No peripheral edema.   SKIN:  Clear with no obvious rashes or skin changes. No nail dyscrasia. NEURO:  Nonfocal. Well oriented.  Appropriate affect.     LAB RESULTS:  CMP     Component Value Date/Time   NA 140 09/01/2017 0803   NA 143 03/11/2017 0841   NA 140 01/07/2017 0758   K 3.6  09/01/2017 0803   K 4.1 01/07/2017 0758   CL 108 09/01/2017 0803   CO2 20 (L) 09/01/2017 0803   CO2 24 01/07/2017 0758   GLUCOSE 126 (H) 09/01/2017 0803   GLUCOSE 134 01/07/2017 0758   BUN 50 (H) 09/01/2017 0803   BUN 40 (H) 03/11/2017 0841   BUN 33.6 (H) 01/07/2017 0758   CREATININE 1.92 (H) 09/01/2017 0803   CREATININE 1.5 (H) 01/07/2017 0758    CALCIUM 9.7 09/01/2017 0803   CALCIUM 9.3 01/07/2017 0758   PROT 7.1 09/01/2017 0803   PROT 6.9 03/11/2017 0841   PROT 6.7 01/07/2017 0758   ALBUMIN 4.0 09/01/2017 0803   ALBUMIN 4.5 03/11/2017 0841   ALBUMIN 3.6 01/07/2017 0758   AST 55 (H) 09/01/2017 0803   AST 51 (H) 01/07/2017 0758   ALT 76 (H) 09/01/2017 0803   ALT 82 (H) 01/07/2017 0758   ALKPHOS 127 (H) 09/01/2017 0803   ALKPHOS 115 01/07/2017 0758   BILITOT 0.7 09/01/2017 0803   BILITOT 0.72 01/07/2017 0758   GFRNONAA 26 (L) 09/01/2017 0803   GFRNONAA 55 (L) 02/18/2013 0827   GFRAA 31 (L) 09/01/2017 0803   GFRAA 63 02/18/2013 0827    INo results found for: SPEP, UPEP  Lab Results  Component Value Date   WBC 4.7 09/01/2017   NEUTROABS 3.0 09/01/2017   HGB 11.1 (L) 09/01/2017   HCT 33.1 (L) 09/01/2017   MCV 93.5 09/01/2017   PLT 110 (L) 09/01/2017      Chemistry      Component Value Date/Time   NA 140 09/01/2017 0803   NA 143 03/11/2017 0841   NA 140 01/07/2017 0758   K 3.6 09/01/2017 0803   K 4.1 01/07/2017 0758   CL 108 09/01/2017 0803   CO2 20 (L) 09/01/2017 0803   CO2 24 01/07/2017 0758   BUN 50 (H) 09/01/2017 0803   BUN 40 (H) 03/11/2017 0841   BUN 33.6 (H) 01/07/2017 0758   CREATININE 1.92 (H) 09/01/2017 0803   CREATININE 1.5 (H) 01/07/2017 0758      Component Value Date/Time   CALCIUM 9.7 09/01/2017 0803   CALCIUM 9.3 01/07/2017 0758   ALKPHOS 127 (H) 09/01/2017 0803   ALKPHOS 115 01/07/2017 0758   AST 55 (H) 09/01/2017 0803   AST 51 (H) 01/07/2017 0758   ALT 76 (H) 09/01/2017 0803   ALT 82 (H) 01/07/2017 0758   BILITOT 0.7 09/01/2017 0803   BILITOT 0.72 01/07/2017 0758       No results found for: LABCA2  No components found for: [id number]  No results for input(s): INR in the last 168 hours.  Urinalysis    Component Value Date/Time   COLORURINE YELLOW 07/21/2015 [place] 07/21/2015 2328   LABSPEC 1.020 07/21/2015 2328   PHURINE 6.0 07/21/2015 2328   GLUCOSEU  NEGATIVE 07/21/2015 2328   HGBUR TRACE (A) 07/21/2015 2328   BILIRUBINUR NEGATIVE 07/21/2015 2328   BILIRUBINUR neg 06/29/2013 1017   KETONESUR NEGATIVE 07/21/2015 2328   PROTEINUR 30 (A) 07/21/2015 2328   UROBILINOGEN negative 06/29/2013 1017   NITRITE NEGATIVE 07/21/2015 2328   LEUKOCYTESUR NEGATIVE 07/21/2015 2328    STUDIES: No results found.   ASSESSMENT: 67 y.o. [place] woman status post right breast upper outer quadrant and right axillary lymph node biopsy 07/19/2014 for a cT2  pN1, stage IIB  invasive ductal carcinoma, grade 2, estrogen and progesterone receptor negative, with an MIB-1 of 90%, and HER-2 amplified  (1) neoadjuvant chemotherapy started  08/08/2014, consisting of carboplatin, docetaxel, trastuzumab and pertuzumab given every 3 weeks 6, completed 11/21/2014  (a) breast MRI after initial 3 cycles shows a significant response  (b) breast MRI after 6 cycles shows a complete radiologic response   (2) trastuzumab continued through 10/02/2015 (to end of capecitabine therapy)  (a) echo 02/13/2015 shows an EF of 60-65%  (b)  echo 05/09/2015 shows an ejection fraction of 60-65%  (c) echocardiogram 06/27/2015 shows an ejection fraction of 55-60%.  (d) echocardiogram 01/22/2017 shows an ejection fraction of 55-60%  (3) right lumpectomy with sentinel lymph node biopsy on 12/27/14 showed a residual  ypT2 ypN1a, stage IIB invasive ductal carcinoma, grade 2, with repeat estrogen and progesterone receptors again negative  (4) status post right oncoplastic surgery (with left reduction mammoplasty) 01/06/2015 showing in the right breast multiple foci of intralymphatic carcinoma in random sections  (5)  Right modified radical mastectomy 02/15/2014 showed no residual invasive disease; there was DCIS but margins were negative; all 15 axillary lymph nodes were clear; ypTis ypN0  (a)  The patient has decided against reconstruction.  (6)  Postmastectomy radiation completed 05/18/2015  with capecitabine sensitization  (7) adjuvant capecitabine continued through 09/27/2015  (8) hepatic steatosis with increased fibrosis score (at risk for cirrhosis)  (9) thyroid biopsy (left lobe inferiorly) 2 shows benign tissue 06/28/2015  (10) LOCAL RECURRENCE:  (a) PET scan 12/06/2015 Notes a right retropectoral lymph node measuring 0.9 cm with an SUV max of 5.5  (b) repeat PET scan 03/11/2016 finds the right subpectoral lymph node to now measure 1.3 cm with an SUV max of 11.8.  (c) PET scan 05/07/2016 shows a significant response to T-DM 1  (d) PET scan 07/10/2017 is clear except for what appears to be a single new liver lesion   (11) started T-DM1 03/21/2016, completed 8 doses 08/13/2016  (a) PET scan 08/19/2016 shows a good response but measurable residual disease  (b) PET scan on 01/16/2017 shows a continuing response  (12) maintenance trastuzumab started 09/03/2016--changed to every 28 days as of 01/28/2017  (a) repeat echocardiogram 07/08/2016 shows an ejection fraction of 60-65%.  (b) echocardiogram on 10/08/2016 demonstrates LVEF of 55-60% (followed by Dr. Haroldine Laws)  (c) echo 01/22/2017 shows an ejection fraction in the 55-60% range  (d) echo 04/24/2017 shows an ejection fraction in the 55-60% range (recommendation for echo every 6 months by Dr. Aundra Dubin)  (e) trastuzumab changed to T-DM 1 after trastuzumab dose 07/15/2017  METASTATIC DISEASE: (13)  radiation to the area of persistent disease in the right upper chest completed 10/30/2016   (14) T-DM 1 resumed 08/12/2017, repeated every 21 days  (a) baseline liver MRI 08/02/2017 shows a 1.4 cm mass, "hot" on PET 07/10/2017  PLAN: Darlene [name] is doing well today.  She tolerated her first cycle of T-DM1 well.  She has not experienced any issues with this.  She has no clinical signs of progression today.  I did review her labs after the completion of today's visit and they remain stable.  Darlene [name] will continue on T-DM1.    In regards  to her blood pressure, I asked if she could half the HCTZ pill.  In the meantime, I will reach out to Dr. Aundra Dubin about the issue.    I reviewed her plan with her, which includes her continuing on therapy every 21 days, and then she will undergo repeat MRI liver in October, with f/u with [name] 2 weeks later.  She and her husband are in agreement with the plan.  I have asked her to let us know if any problems develop before the next visit.  A total of (30) minutes of face-to-face time was spent with this patient with greater than 50% of that time in counseling and care-coordination.   Wilber Bihari, NP  09/01/17 11:28 AM Medical Oncology and Hematology [facility] [place] [address] [place], [place] [address] Tel. [phone]    Fax. [phone]

## 2017-09-01 NOTE — Progress Notes (Signed)
Per. Dr. Jana Hakim, okay to treat with Crt of 1.92

## 2017-09-02 ENCOUNTER — Telehealth: Payer: Self-pay | Admitting: Adult Health

## 2017-09-02 NOTE — Telephone Encounter (Signed)
Per 8/12 no los °

## 2017-09-02 NOTE — Telephone Encounter (Signed)
Called and spoke with Patient regarding Dr. Aundra Dubin reviewing her blood pressures and his recommendation to stop taking the HCTZ and monitor her BP at home.  Darlene [name] verbalized understanding of this recommendation and will follow it.  She knows to call us back if she develops any symptoms with this, or elevated BP.    Wilber Bihari, NP

## 2017-09-02 NOTE — Progress Notes (Signed)
She can stop HCTZ and continue to keep an eye on her BP.

## 2017-09-03 ENCOUNTER — Ambulatory Visit (INDEPENDENT_AMBULATORY_CARE_PROVIDER_SITE_OTHER): Payer: BC Managed Care – PPO | Admitting: Family Medicine

## 2017-09-03 VITALS — BP 109/66 | HR 68 | Temp 98.2°F | Ht 69.0 in | Wt 194.0 lb

## 2017-09-03 DIAGNOSIS — E669 Obesity, unspecified: Secondary | ICD-10-CM

## 2017-09-03 DIAGNOSIS — Z6830 Body mass index (BMI) 30.0-30.9, adult: Secondary | ICD-10-CM

## 2017-09-03 DIAGNOSIS — E119 Type 2 diabetes mellitus without complications: Secondary | ICD-10-CM | POA: Diagnosis not present

## 2017-09-03 DIAGNOSIS — F3289 Other specified depressive episodes: Secondary | ICD-10-CM | POA: Diagnosis not present

## 2017-09-03 DIAGNOSIS — Z794 Long term (current) use of insulin: Secondary | ICD-10-CM | POA: Diagnosis not present

## 2017-09-03 DIAGNOSIS — E66811 Obesity, class 1: Secondary | ICD-10-CM

## 2017-09-03 MED ORDER — LIRAGLUTIDE 18 MG/3ML ~~LOC~~ SOPN: 1.8000 mg | PEN_INJECTOR | SUBCUTANEOUS | 0 refills | Status: DC

## 2017-09-03 MED ORDER — TOPIRAMATE 25 MG PO TABS: 25.0000 mg | ORAL_TABLET | Freq: Every day | ORAL | 0 refills | Status: DC

## 2017-09-03 MED ORDER — BASAGLAR KWIKPEN 100 UNIT/ML ~~LOC~~ SOPN: 12.0000 [IU] | PEN_INJECTOR | Freq: Every day | SUBCUTANEOUS | 0 refills | Status: DC

## 2017-09-03 MED ORDER — METFORMIN HCL 500 MG PO TABS: 500.0000 mg | ORAL_TABLET | Freq: Every day | ORAL | 0 refills | Status: DC

## 2017-09-03 NOTE — Progress Notes (Signed)
Office: [phone]  /  Fax: [phone]   HPI:   Chief Complaint: OBESITY Darlene [name] is here to discuss her progress with her obesity treatment plan. She is on the keep a food journal with 1300 to 1500 calories and 80+ grams of protein daily plan and is following her eating plan approximately 70 % of the time. She states she is exercising 0 minutes 0 times per week. Darlene [name] has done better with weight loss. She is working on Research officer, trade union and increasing vegetables. Darlene [name] had a cancer food class and she is doing better with food choices. Her weight is 194 lb (88 kg) today and has had a weight loss of 4 pounds over a period of 2 to 3 weeks since her last visit. She has lost 31 lbs since starting treatment with [name].  Diabetes II Darlene [name] has a diagnosis of diabetes type II. Darlene [name] states fasting BGs range between 80 and 100 approximately, but she isn't checking her 2 hour post prandial BGs very often. Darlene [name] denies any hypoglycemic episodes. Last A1c was at 6.1 Darlene [name] has been working on diet and exercise and is doing well. She has been working on intensive lifestyle modifications including diet, exercise, and weight loss to help control her blood glucose levels.  Depression with emotional eating behaviors Darlene [name] is on Trintellix and Topiramate and states her mood has improved and she is learning to not let things out of her control affect her so much. Darlene [name] struggles with emotional eating and using food for comfort to the extent that it is negatively impacting her health. She often snacks when she is not hungry. Darlene [name] sometimes feels she is out of control and then feels guilty that she made poor food choices. She has been working on behavior modification techniques to help reduce her emotional eating and has been somewhat successful. She shows no sign of suicidal or homicidal ideations. Darlene [name] has good support while dealing with her metastatic breast cancer.  Depression screen [facility]  2/9 09/12/2016 08/09/2016 01/01/2016 11/15/2015 10/12/2015  Decreased Interest 0 1 0 0 0  Down, Depressed, Hopeless 0 1 0 0 0  PHQ - 2 Score 0 2 0 0 0  Altered sleeping - 0 - - -  Tired, decreased energy - 1 - - -  Change in appetite - 2 - - -  Feeling bad or failure about yourself  - 2 - - -  Trouble concentrating - 1 - - -  Moving slowly or fidgety/restless - 1 - - -  Suicidal thoughts - 0 - - -  PHQ-9 Score - 9 - - -  Some recent data might be hidden     ALLERGIES: Allergies  Allergen Reactions  . Nsaids Other (See Comments)    H/o gastric bypass - avoid NSAIDs  . Tape Hives, Rash and Other (See Comments)    Adhesives in bandaids    MEDICATIONS: Current Outpatient Medications on File Prior to Visit  Medication Sig Dispense Refill  . acetaminophen (TYLENOL) 325 MG tablet Take 650 mg by mouth 2 (two) times daily as needed for moderate pain or headache.     [name] buPROPion (WELLBUTRIN SR) 200 MG 12 hr tablet TAKE 1 TABLET BY MOUTH DAILY AT 12 NOON. 30 tablet 0  . CALCIUM CITRATE-VITAMIN D PO Take 1 tablet by mouth 3 (three) times daily.     . cholecalciferol (VITAMIN D) 1000 units tablet Take 1,000 Units by mouth daily.    . Cyanocobalamin (VITAMIN B-12 PO) Take 1 tablet by  mouth every 14 (fourteen) days.    . diphenhydrAMINE (BENADRYL) 25 MG tablet Take 25 mg by mouth daily as needed for allergies or sleep.     . Insulin Glargine (BASAGLAR KWIKPEN) 100 UNIT/ML SOPN Inject 0.16 mLs (16 Units total) into the skin at bedtime. (Patient taking differently: Inject 12 Units into the skin daily. ) 2 pen 0  . Insulin Pen Needle (BD PEN NEEDLE NANO U/F) 32G X 4 MM MISC 1 Package by Does not apply route daily at 12 noon. 100 each 0  . liraglutide (VICTOZA) 18 MG/3ML SOPN Inject 0.3 mLs (1.8 mg total) into the skin every morning. 3 pen 0  . losartan (COZAAR) 100 MG tablet Take 100 mg by mouth daily.     . Melatonin 3 MG CAPS Take 3 mg by mouth at bedtime.     . metFORMIN (GLUCOPHAGE) 500 MG  tablet Take 1 tablet (500 mg total) by mouth daily. 30 tablet 0  . ONETOUCH DELICA LANCETS 54T MISC 1 Package by Does not apply route daily. 100 each 0  . Pediatric Multivitamins-Iron (FLINTSTONES PLUS IRON) chewable tablet Chew 1 tablet by mouth 2 (two) times daily.    . Probiotic Product ([employer] PROBIOTIC PO) Take 1 capsule by mouth daily.     [name] Propylene Glycol (SYSTANE BALANCE OP) Place 1-2 drops into both eyes 3 (three) times daily as needed (for dry eyes).     [name] senna-docusate (SENNA PLUS) 8.6-50 MG tablet Take 1 tablet by mouth daily as needed for moderate constipation.     . topiramate (TOPAMAX) 25 MG tablet TAKE 1 TABLET BY MOUTH EVERY DAY 30 tablet 0  . vortioxetine HBr (TRINTELLIX) 20 MG TABS Take 20 mg by mouth at bedtime. 30 tablet 0  . hydrochlorothiazide (HYDRODIURIL) 25 MG tablet TAKE 1 TABLET BY MOUTH EVERY DAY (Patient not taking: Reported on 09/03/2017) 30 tablet 11   No current facility-administered medications on file prior to visit.     PAST MEDICAL HISTORY: Past Medical History:  Diagnosis Date  . Anemia    hx of - during 1st round chemo  . Anxiety   . Arthritis    in fingers, shoulders  . Back pain   . Balance problem   . Breast cancer ([place])   . Breast cancer of upper-outer quadrant of right female breast ([place]) 07/22/2014  . Bunion, right foot   . Clumsiness   . Constipation   . Depression   . Diabetes mellitus without complication ([place])    62+ years  . Eating disorder    binge eating  . Epistaxis 08/26/2014  . Fatty liver   . HCAP (healthcare-associated pneumonia) 12/18/2014  . Headache   . Heart murmur    never had any problems  . History of radiation therapy 04/05/15-05/19/15   right chest wall was treated to 50.4 Gy in 28 fractions, right mastectomy scar was treated to 10 Gy in 5 fractions  . Hyperlipidemia   . Hypertension   . Joint pain   . Multiple food allergies   . Neuromuscular disorder ([place])    diabetic neuropathy  . Obesity   . Obstructive  sleep apnea on CPAP    uses cpap nightly  . Ovarian cyst rupture    possible  . Pneumonia   . Pneumonia 11/2014  . Radiation 04/05/15-05/19/15   right chest wall 50.4 Gy, mastectomy scar 10 Gy  . Sleep apnea   . Thyroid nodule 06/2014    PAST SURGICAL HISTORY: Past Surgical  History:  Procedure Laterality Date  . APPENDECTOMY    . BIOPSY THYROID Bilateral 06/2014   2 nodules - Benign   . BREAST LUMPECTOMY WITH RADIOACTIVE SEED AND SENTINEL LYMPH NODE BIOPSY Right 12/27/2014   Procedure: BREAST LUMPECTOMY WITH RADIOACTIVE SEED AND SENTINEL LYMPH NODE BIOPSY;  Surgeon: Stark Klein, MD;  Location: [place];  Service: General;  Laterality: Right;  . BREAST REDUCTION SURGERY Bilateral 01/06/2015   Procedure: BILATERAL BREAST REDUCTION, RIGHT ONCOPLASTIC RECONSTRUCTION),LEFT BREAST REDUCTION FOR SYMMETRY;  Surgeon: [name], MD;  Location: [place];  Service: Plastics;  Laterality: Bilateral;  . BREATH TEK H PYLORI N/A 09/15/2012   Procedure: BREATH TEK H PYLORI;  Surgeon: Pedro Earls, MD;  Location: [facility];  Service: General;  Laterality: N/A;  . BUNIONECTOMY Left 12/2013  . CARPAL TUNNEL RELEASE Right   . CARPAL TUNNEL RELEASE Left   . CATARACT EXTRACTION     6/19  . COLONOSCOPY    . DUPUYTREN CONTRACTURE RELEASE    . GASTRIC ROUX-EN-Y N/A 11/02/2012   Procedure: LAPAROSCOPIC ROUX-EN-Y GASTRIC;  Surgeon: Pedro Earls, MD;  Location: [facility];  Service: General;  Laterality: N/A;  . IR GENERIC HISTORICAL  03/18/2016   IR US GUIDE VASC ACCESS LEFT 03/18/2016 Markus Daft, MD [facility]  . IR GENERIC HISTORICAL  03/18/2016   IR FLUORO GUIDE CV LINE LEFT 03/18/2016 Markus Daft, MD [facility]  . LAPAROSCOPIC APPENDECTOMY N/A 07/22/2015   Procedure: APPENDECTOMY LAPAROSCOPIC;  Surgeon: Michael Boston, MD;  Location: [facility];  Service: General;  Laterality: N/A;  . MASTECTOMY Right 02/15/2015   modified  . MODIFIED MASTECTOMY Right 02/15/2015   Procedure: RIGHT  MODIFIED RADICAL MASTECTOMY;  Surgeon: Stark Klein, MD;  Location: [place];  Service: General;  Laterality: Right;  . PORT-A-CATH REMOVAL Left 10/10/2015   Procedure: PORT REMOVAL;  Surgeon: Stark Klein, MD;  Location: [place];  Service: General;  Laterality: Left;  . PORTACATH PLACEMENT Left 08/02/2014   Procedure: INSERTION PORT-A-CATH;  Surgeon: Stark Klein, MD;  Location: [place];  Service: General;  Laterality: Left;  . PORTACATH PLACEMENT N/A 11/05/2016   Procedure: INSERTION PORT-A-CATH;  Surgeon: Stark Klein, MD;  Location: [place];  Service: General;  Laterality: N/A;  . ROTATOR CUFF REPAIR Right 2005  . SCAR REVISION Right 12/08/2015   Procedure: COMPLEX REPAIR RIGHT CHEST 10-15CM;  Surgeon: [name], MD;  Location: [place];  Service: Plastics;  Laterality: Right;  . TOE SURGERY  1970s   bone spur  . TRIGGER FINGER RELEASE     x3    SOCIAL HISTORY: Social History   Tobacco Use  . Smoking status: Never Smoker  . Smokeless tobacco: Never Used  Substance Use Topics  . Alcohol use: Yes    Alcohol/week: 1.0 standard drinks    Types: 1 Standard drinks or equivalent per week    Comment: social  . Drug use: No    FAMILY HISTORY: Family History  Problem Relation Age of Onset  . CVA Mother   . Heart disease Mother   . Sudden death Mother   . Diabetes Father   . Heart failure Father   . Hypertension Father   . Kidney disease Father   . Obesity Father   . Hypertension Sister   . Diabetes Brother   . Breast cancer Paternal Grandmother   . Diabetes Paternal Uncle     ROS: Review of Systems  Constitutional: Positive for weight loss.  Endo/Heme/Allergies:  Negative for hypoglycemia  Psychiatric/Behavioral: Positive for depression. Negative for suicidal ideas.    PHYSICAL EXAM: Blood pressure 109/66, pulse 68, temperature 98.2 F (36.8 C), temperature source Oral, height 5\' 9"  (1.753 m), weight 194 lb (88 kg), last  menstrual period 10/22/2007, SpO2 99 %. Body mass index is 28.65 kg/m. Physical Exam  Constitutional: She is oriented to person, place, and time. She appears well-developed and well-nourished.  Cardiovascular: Normal rate.  Pulmonary/Chest: Effort normal.  Musculoskeletal: Normal range of motion.  Neurological: She is oriented to person, place, and time.  Skin: Skin is warm and dry.  Psychiatric: She has a normal mood and affect. Her behavior is normal.  Vitals reviewed.   RECENT LABS AND TESTS: BMET    Component Value Date/Time   NA 140 09/01/2017 0803   NA 143 03/11/2017 0841   NA 140 01/07/2017 0758   K 3.6 09/01/2017 0803   K 4.1 01/07/2017 0758   CL 108 09/01/2017 0803   CO2 20 (L) 09/01/2017 0803   CO2 24 01/07/2017 0758   GLUCOSE 126 (H) 09/01/2017 0803   GLUCOSE 134 01/07/2017 0758   BUN 50 (H) 09/01/2017 0803   BUN 40 (H) 03/11/2017 0841   BUN 33.6 (H) 01/07/2017 0758   CREATININE 1.92 (H) 09/01/2017 0803   CREATININE 1.5 (H) 01/07/2017 0758   CALCIUM 9.7 09/01/2017 0803   CALCIUM 9.3 01/07/2017 0758   GFRNONAA 26 (L) 09/01/2017 0803   GFRNONAA 55 (L) 02/18/2013 0827   GFRAA 31 (L) 09/01/2017 0803   GFRAA 63 02/18/2013 0827   Lab Results  Component Value Date   HGBA1C 6.1 (H) 03/11/2017   HGBA1C 8.2 (H) 11/05/2016   HGBA1C 8.1 (H) 08/09/2016   HGBA1C 6.5 (H) 01/03/2015   HGBA1C 6.9 (H) 02/18/2013   Lab Results  Component Value Date   INSULIN 15.3 03/11/2017   INSULIN 18.3 08/09/2016   CBC    Component Value Date/Time   WBC 4.7 09/01/2017 0803   WBC 5.4 07/30/2017 1222   RBC 3.54 (L) 09/01/2017 0803   HGB 11.1 (L) 09/01/2017 0803   HGB 10.8 (L) 01/07/2017 0758   HCT 33.1 (L) 09/01/2017 0803   HCT 31.9 (L) 01/07/2017 0758   PLT 110 (L) 09/01/2017 0803   PLT 126 (L) 01/07/2017 0758   MCV 93.5 09/01/2017 0803   MCV 90.8 01/07/2017 0758   MCH 31.4 09/01/2017 0803   MCHC 33.5 09/01/2017 0803   RDW 13.6 09/01/2017 0803   RDW 14.8 (H) 01/07/2017  0758   LYMPHSABS 1.1 09/01/2017 0803   LYMPHSABS 1.1 01/07/2017 0758   MONOABS 0.4 09/01/2017 0803   MONOABS 0.3 01/07/2017 0758   EOSABS 0.1 09/01/2017 0803   EOSABS 0.1 01/07/2017 0758   EOSABS 0.1 08/09/2016 1229   BASOSABS 0.0 09/01/2017 0803   BASOSABS 0.0 01/07/2017 0758   Iron/TIBC/Ferritin/ %Sat    Component Value Date/Time   IRON 58 02/18/2013 0827   TIBC 305 02/18/2013 0827   FERRITIN 133 10/31/2014 0816   IRONPCTSAT 19 (L) 02/18/2013 0827   Lipid Panel     Component Value Date/Time   CHOL 164 02/07/2017   CHOL 182 08/09/2016 1229   TRIG 114 02/07/2017   HDL 59 02/07/2017   HDL 62 08/09/2016 1229   CHOLHDL 2.9 02/18/2013 0827   VLDL 23 02/18/2013 0827   LDLCALC 83 02/07/2017   LDLCALC 85 08/09/2016 1229   Hepatic Function Panel     Component Value Date/Time   PROT 7.1 09/01/2017 0803  PROT 6.9 03/11/2017 0841   PROT 6.7 01/07/2017 0758   ALBUMIN 4.0 09/01/2017 0803   ALBUMIN 4.5 03/11/2017 0841   ALBUMIN 3.6 01/07/2017 0758   AST 55 (H) 09/01/2017 0803   AST 51 (H) 01/07/2017 0758   ALT 76 (H) 09/01/2017 0803   ALT 82 (H) 01/07/2017 0758   ALKPHOS 127 (H) 09/01/2017 0803   ALKPHOS 115 01/07/2017 0758   BILITOT 0.7 09/01/2017 0803   BILITOT 0.72 01/07/2017 0758      Component Value Date/Time   TSH 1.54 02/07/2017   TSH 2.170 08/09/2016 1229   Results for Rallo, Darlene L "[name]" (MRN [id number]) as of 09/03/2017 14:17  Ref. Range 03/11/2017 08:41  Vitamin D, 25-Hydroxy Latest Ref Range: 30.0 - 100.0 ng/mL 60.4   ASSESSMENT AND PLAN: Type 2 diabetes mellitus without complication, with long-term current use of insulin (HCC) - Plan: Hemoglobin A1c, Lipid Panel With LDL/HDL Ratio, VITAMIN D 25 Hydroxy (Vit-D Deficiency, Fractures), metFORMIN (GLUCOPHAGE) 500 MG tablet, liraglutide (VICTOZA) 18 MG/3ML SOPN, Insulin Glargine (BASAGLAR KWIKPEN) 100 UNIT/ML SOPN  Other depression - with emotional eating - Plan: topiramate (TOPAMAX) 25 MG tablet  Class  1 obesity with serious comorbidity and body mass index (BMI) of 30.0 to 30.9 in adult, unspecified obesity type - Starting BMI greater then 30  PLAN:  Insulin Resistance Lamiracle will continue to work on weight loss, exercise, and decreasing simple carbohydrates in her diet to help decrease the risk of diabetes. We dicussed metformin including benefits and risks. She was informed that eating too many simple carbohydrates or too many calories at one sitting increases the likelihood of GI side effects. Juliann agreed to continue Metformin, Victoza and Engineer, agricultural. We will refill Metformin, Victoza and Basaglar for 1 month  Jenasis agreed to follow up with [name] as directed to monitor her progress.  Depression with Emotional Eating Behaviors We discussed behavior modification techniques today to help Anahi deal with her emotional eating and depression. She has agreed to continue Topamax 25 mg daily #30 with no refills and follow up as directed.  Obesity Natashia is currently in the action stage of change. As such, her goal is to continue with weight loss efforts She has agreed to keep a food journal with 1300 to 1500 calories and 80+ grams of protein  Raghad has been instructed to work up to a goal of 150 minutes of combined cardio and strengthening exercise per week for weight loss and overall health benefits. We discussed the following Behavioral Modification Strategies today: increase H2O intake and work on meal planning and easy cooking plans  Aariana has agreed to follow up with our clinic in 3 weeks. She was informed of the importance of frequent follow up visits to maximize her success with intensive lifestyle modifications for her multiple health conditions.  I, Doreene Nest, am acting as transcriptionist for Dennard Nip, MD I have reviewed the above documentation for accuracy and completeness, and I agree with the above. -Dennard Nip, MD

## 2017-09-04 LAB — HEMOGLOBIN A1C
Est. average glucose Bld gHb Est-mCnc: 120 mg/dL
Hgb A1c MFr Bld: 5.8 % — ABNORMAL HIGH (ref 4.8–5.6)

## 2017-09-04 LAB — LIPID PANEL WITH LDL/HDL RATIO
Cholesterol, Total: 160 mg/dL (ref 100–199)
HDL: 59 mg/dL (ref >39)
LDL Calculated: 79 mg/dL (ref 0–99)
LDl/HDL Ratio: 1.3 ratio (ref 0.0–3.2)
Triglycerides: 111 mg/dL (ref 0–149)
VLDL Cholesterol Cal: 22 mg/dL (ref 5–40)

## 2017-09-04 LAB — VITAMIN D 25 HYDROXY (VIT D DEFICIENCY, FRACTURES): Vit D, 25-Hydroxy: 58.3 ng/mL (ref 30.0–100.0)

## 2017-09-07 ENCOUNTER — Other Ambulatory Visit (INDEPENDENT_AMBULATORY_CARE_PROVIDER_SITE_OTHER): Payer: Self-pay | Admitting: Family Medicine

## 2017-09-07 DIAGNOSIS — E119 Type 2 diabetes mellitus without complications: Secondary | ICD-10-CM

## 2017-09-07 DIAGNOSIS — Z794 Long term (current) use of insulin: Secondary | ICD-10-CM

## 2017-09-09 ENCOUNTER — Ambulatory Visit: Payer: BC Managed Care – PPO

## 2017-09-09 ENCOUNTER — Other Ambulatory Visit: Payer: BC Managed Care – PPO

## 2017-09-12 ENCOUNTER — Other Ambulatory Visit (INDEPENDENT_AMBULATORY_CARE_PROVIDER_SITE_OTHER): Payer: Self-pay | Admitting: Family Medicine

## 2017-09-12 DIAGNOSIS — Z794 Long term (current) use of insulin: Secondary | ICD-10-CM

## 2017-09-12 DIAGNOSIS — E119 Type 2 diabetes mellitus without complications: Secondary | ICD-10-CM

## 2017-09-16 ENCOUNTER — Other Ambulatory Visit (INDEPENDENT_AMBULATORY_CARE_PROVIDER_SITE_OTHER): Payer: Self-pay | Admitting: Family Medicine

## 2017-09-16 DIAGNOSIS — E119 Type 2 diabetes mellitus without complications: Secondary | ICD-10-CM

## 2017-09-16 DIAGNOSIS — Z794 Long term (current) use of insulin: Secondary | ICD-10-CM

## 2017-09-23 ENCOUNTER — Inpatient Hospital Stay: Payer: Medicare Other

## 2017-09-23 ENCOUNTER — Inpatient Hospital Stay: Payer: Medicare Other | Attending: Oncology

## 2017-09-23 VITALS — BP 139/69 | HR 67 | Temp 98.7°F | Resp 16

## 2017-09-23 DIAGNOSIS — C50411 Malignant neoplasm of upper-outer quadrant of right female breast: Secondary | ICD-10-CM

## 2017-09-23 DIAGNOSIS — C787 Secondary malignant neoplasm of liver and intrahepatic bile duct: Secondary | ICD-10-CM

## 2017-09-23 DIAGNOSIS — Z171 Estrogen receptor negative status [ER-]: Secondary | ICD-10-CM

## 2017-09-23 DIAGNOSIS — Z5112 Encounter for antineoplastic immunotherapy: Secondary | ICD-10-CM | POA: Diagnosis not present

## 2017-09-23 DIAGNOSIS — Z452 Encounter for adjustment and management of vascular access device: Secondary | ICD-10-CM | POA: Diagnosis not present

## 2017-09-23 DIAGNOSIS — Z95828 Presence of other vascular implants and grafts: Secondary | ICD-10-CM

## 2017-09-23 LAB — CBC WITH DIFFERENTIAL (CANCER CENTER ONLY)
Basophils Absolute: 0 10*3/uL (ref 0.0–0.1)
Basophils Relative: 0 %
Eosinophils Absolute: 0.1 10*3/uL (ref 0.0–0.5)
Eosinophils Relative: 3 %
HCT: 31.2 % — ABNORMAL LOW (ref 34.8–46.6)
Hemoglobin: 10.5 g/dL — ABNORMAL LOW (ref 11.6–15.9)
Lymphocytes Relative: 25 %
Lymphs Abs: 1 10*3/uL (ref 0.9–3.3)
MCH: 31.1 pg (ref 25.1–34.0)
MCHC: 33.5 g/dL (ref 31.5–36.0)
MCV: 92.9 fL (ref 79.5–101.0)
Monocytes Absolute: 0.3 10*3/uL (ref 0.1–0.9)
Monocytes Relative: 8 %
Neutro Abs: 2.5 10*3/uL (ref 1.5–6.5)
Neutrophils Relative %: 64 %
Platelet Count: 78 10*3/uL — ABNORMAL LOW (ref 145–400)
RBC: 3.36 MIL/uL — ABNORMAL LOW (ref 3.70–5.45)
RDW: 13.7 % (ref 11.2–14.5)
WBC Count: 3.9 10*3/uL (ref 3.9–10.3)

## 2017-09-23 LAB — CMP (CANCER CENTER ONLY)
ALT: 57 U/L — ABNORMAL HIGH (ref 0–44)
AST: 48 U/L — ABNORMAL HIGH (ref 15–41)
Albumin: 3.7 g/dL (ref 3.5–5.0)
Alkaline Phosphatase: 125 U/L (ref 38–126)
Anion gap: 8 (ref 5–15)
BUN: 22 mg/dL (ref 8–23)
CO2: 24 mmol/L (ref 22–32)
Calcium: 9.5 mg/dL (ref 8.9–10.3)
Chloride: 113 mmol/L — ABNORMAL HIGH (ref 98–111)
Creatinine: 1.43 mg/dL — ABNORMAL HIGH (ref 0.44–1.00)
GFR, Est AFR Am: 44 mL/min — ABNORMAL LOW (ref >60)
GFR, Estimated: 38 mL/min — ABNORMAL LOW (ref >60)
Glucose, Bld: 99 mg/dL (ref 70–99)
Potassium: 3.8 mmol/L (ref 3.5–5.1)
Sodium: 145 mmol/L (ref 135–145)
Total Bilirubin: 0.7 mg/dL (ref 0.3–1.2)
Total Protein: 6.7 g/dL (ref 6.5–8.1)

## 2017-09-23 MED ORDER — ACETAMINOPHEN 325 MG PO TABS
650.0000 mg | ORAL_TABLET | Freq: Once | ORAL | Status: AC
  Administered 2017-09-23: 650 mg via ORAL

## 2017-09-23 MED ORDER — SODIUM CHLORIDE 0.9 % IV SOLN
Freq: Once | INTRAVENOUS | Status: AC
  Administered 2017-09-23: 15:00:00 via INTRAVENOUS
  Filled 2017-09-23: qty 250

## 2017-09-23 MED ORDER — ACETAMINOPHEN 325 MG PO TABS
ORAL_TABLET | ORAL | Status: AC
  Filled 2017-09-23: qty 2

## 2017-09-23 MED ORDER — HEPARIN SOD (PORK) LOCK FLUSH 100 UNIT/ML IV SOLN
500.0000 [IU] | Freq: Once | INTRAVENOUS | Status: AC | PRN
  Administered 2017-09-23: 500 [IU]
  Filled 2017-09-23: qty 5

## 2017-09-23 MED ORDER — DIPHENHYDRAMINE HCL 25 MG PO CAPS
ORAL_CAPSULE | ORAL | Status: AC
  Filled 2017-09-23: qty 1

## 2017-09-23 MED ORDER — SODIUM CHLORIDE 0.9 % IV SOLN
3.6000 mg/kg | Freq: Once | INTRAVENOUS | Status: AC
  Administered 2017-09-23: 320 mg via INTRAVENOUS
  Filled 2017-09-23: qty 16

## 2017-09-23 MED ORDER — DIPHENHYDRAMINE HCL 25 MG PO CAPS
25.0000 mg | ORAL_CAPSULE | Freq: Once | ORAL | Status: AC
  Administered 2017-09-23: 25 mg via ORAL

## 2017-09-23 MED ORDER — SODIUM CHLORIDE 0.9 % IJ SOLN
10.0000 mL | INTRAMUSCULAR | Status: DC | PRN
  Administered 2017-09-23: 10 mL via INTRAVENOUS
  Filled 2017-09-23: qty 10

## 2017-09-23 MED ORDER — SODIUM CHLORIDE 0.9% FLUSH
10.0000 mL | INTRAVENOUS | Status: DC | PRN
  Administered 2017-09-23: 10 mL
  Filled 2017-09-23: qty 10

## 2017-09-23 NOTE — Progress Notes (Signed)
Per Dr Jana Hakim, [name] for [place] today with Echo from April 2019.  Pt is scheduled for next Echo in October.

## 2017-09-23 NOTE — Patient Instructions (Signed)
[facility] Discharge Instructions for Patients Receiving Chemotherapy  Today you received the following chemotherapy agents Kadcyla.  To help prevent nausea and vomiting after your treatment, we encourage you to take your nausea medication.   If you develop nausea and vomiting that is not controlled by your nausea medication, call the clinic.   BELOW ARE SYMPTOMS THAT SHOULD BE REPORTED IMMEDIATELY:  *FEVER GREATER THAN 100.5 F  *CHILLS WITH OR WITHOUT FEVER  NAUSEA AND VOMITING THAT IS NOT CONTROLLED WITH YOUR NAUSEA MEDICATION  *UNUSUAL SHORTNESS OF BREATH  *UNUSUAL BRUISING OR BLEEDING  TENDERNESS IN MOUTH AND THROAT WITH OR WITHOUT PRESENCE OF ULCERS  *URINARY PROBLEMS  *BOWEL PROBLEMS  UNUSUAL RASH Items with * indicate a potential emergency and should be followed up as soon as possible.  Feel free to call the clinic should you have any questions or concerns. The clinic phone number is [phone].  Please show the CHEMO ALERT CARD at check-in to the Emergency Department and triage nurse.  Ado-Trastuzumab Emtansine for injection What is this medicine? ADO-TRASTUZUMAB EMTANSINE (ADD oh traz TOO zuh mab em TAN zine) is a monoclonal antibody combined with chemotherapy. It is used to treat breast cancer. This medicine may be used for other purposes; ask your health care provider or pharmacist if you have questions. COMMON BRAND NAME(S): Kadcyla What should I tell my health care provider before I take this medicine? They need to know if you have any of these conditions: -heart disease -heart failure -infection (especially a virus infection such as chickenpox, cold sores, or herpes) -liver disease -lung or breathing disease, like asthma -an unusual or allergic reaction to ado-trastuzumab emtansine, other medications, foods, dyes, or preservatives -pregnant or trying to get pregnant -breast-feeding How should I use this medicine? This medicine is for  infusion into a vein. It is given by a health care professional in a hospital or clinic setting. Talk to your pediatrician regarding the use of this medicine in children. Special care may be needed. Overdosage: If you think you have taken too much of this medicine contact a poison control center or emergency room at once. NOTE: This medicine is only for you. Do not share this medicine with others. What if I miss a dose? It is important not to miss your dose. Call your doctor or health care professional if you are unable to keep an appointment. What may interact with this medicine? This medicine may also interact with the following medications: -atazanavir -boceprevir -clarithromycin -delavirdine -indinavir -dalfopristin; quinupristin -isoniazid, INH -itraconazole -ketoconazole -nefazodone -nelfinavir -ritonavir -telaprevir -telithromycin -tipranavir -voriconazole This list may not describe all possible interactions. Give your health care provider a list of all the medicines, herbs, non-prescription drugs, or dietary supplements you use. Also tell them if you smoke, drink alcohol, or use illegal drugs. Some items may interact with your medicine. What should I watch for while using this medicine? Visit your doctor for checks on your progress. This drug may make you feel generally unwell. This is not uncommon, as chemotherapy can affect healthy cells as well as cancer cells. Report any side effects. Continue your course of treatment even though you feel ill unless your doctor tells you to stop. You may need blood work done while you are taking this medicine. Call your doctor or health care professional for advice if you get a fever, chills or sore throat, or other symptoms of a cold or flu. Do not treat yourself. This drug decreases your body's ability to   fight infections. Try to avoid being around people who are sick. Be careful brushing and flossing your teeth or using a toothpick because  you may get an infection or bleed more easily. If you have any dental work done, tell your dentist you are receiving this medicine. Avoid taking products that contain aspirin, acetaminophen, ibuprofen, naproxen, or ketoprofen unless instructed by your doctor. These medicines may hide a fever. Do not become pregnant while taking this medicine or for 7 months after stopping it, men with female partners should use contraception during treatment and for 4 months after the last dose. Women should inform their doctor if they wish to become pregnant or think they might be pregnant. There is a potential for serious side effects to an unborn child. Do not breast-feed an infant while taking this medicine or for 7 months after the last dose. Men who have a partner who is pregnant or who is capable of becoming pregnant should use a condom during sexual activity while taking this medicine and for 4 months after stopping it. Men should inform their doctors if they wish to father a child. This medicine may lower sperm counts. Talk to your health care professional or pharmacist for more information. What side effects may I notice from receiving this medicine? Side effects that you should report to your doctor or health care professional as soon as possible: -allergic reactions like skin rash, itching or hives, swelling of the face, lips, or tongue -breathing problems -chest pain or palpitations -fever or chills, sore throat -general ill feeling or flu-like symptoms -light-colored stools -nausea, vomiting -pain, tingling, numbness in the hands or feet -signs and symptoms of bleeding such as bloody or black, tarry stools; red or dark-brown urine; spitting up blood or brown material that looks like coffee grounds; red spots on the skin; unusual bruising or bleeding from the eye, gums, or nose -swelling of the legs or ankles -yellowing of the eyes or skin Side effects that usually do not require medical attention  (report to your doctor or health care professional if they continue or are bothersome): -changes in taste -constipation -dizziness -headache -joint pain -muscle pain -trouble sleeping -unusually weak or tired This list may not describe all possible side effects. Call your doctor for medical advice about side effects. You may report side effects to FDA at 1-800-FDA-1088. Where should I keep my medicine? This drug is given in a hospital or clinic and will not be stored at home. NOTE: This sheet is a summary. It may not cover all possible information. If you have questions about this medicine, talk to your doctor, pharmacist, or health care provider.  2018 Elsevier/Gold Standard (2015-02-27 12:11:06)    

## 2017-09-23 NOTE — Progress Notes (Signed)
Ok to treat with lab results today per DR. Magrinat.

## 2017-09-24 ENCOUNTER — Ambulatory Visit (INDEPENDENT_AMBULATORY_CARE_PROVIDER_SITE_OTHER): Payer: Medicare Other | Admitting: Family Medicine

## 2017-09-24 VITALS — BP 111/65 | HR 66 | Temp 98.0°F | Ht 69.0 in | Wt 199.0 lb

## 2017-09-24 DIAGNOSIS — E119 Type 2 diabetes mellitus without complications: Secondary | ICD-10-CM

## 2017-09-24 DIAGNOSIS — Z6830 Body mass index (BMI) 30.0-30.9, adult: Secondary | ICD-10-CM | POA: Diagnosis not present

## 2017-09-24 DIAGNOSIS — F3289 Other specified depressive episodes: Secondary | ICD-10-CM

## 2017-09-24 DIAGNOSIS — I10 Essential (primary) hypertension: Secondary | ICD-10-CM

## 2017-09-24 DIAGNOSIS — E669 Obesity, unspecified: Secondary | ICD-10-CM | POA: Diagnosis not present

## 2017-09-24 DIAGNOSIS — Z794 Long term (current) use of insulin: Secondary | ICD-10-CM

## 2017-09-24 MED ORDER — LIRAGLUTIDE 18 MG/3ML ~~LOC~~ SOPN: 1.8000 mg | PEN_INJECTOR | SUBCUTANEOUS | 0 refills | Status: DC

## 2017-09-24 MED ORDER — BUPROPION HCL ER (SR) 200 MG PO TB12: ORAL_TABLET | ORAL | 0 refills | Status: DC

## 2017-09-24 MED ORDER — METFORMIN HCL 500 MG PO TABS: 500.0000 mg | ORAL_TABLET | Freq: Every day | ORAL | 0 refills | Status: DC

## 2017-09-24 MED ORDER — ONETOUCH DELICA LANCETS 33G MISC: 1.0000 | Freq: Every day | 0 refills | Status: DC

## 2017-09-24 MED ORDER — TOPIRAMATE 25 MG PO TABS: 25.0000 mg | ORAL_TABLET | Freq: Every day | ORAL | 0 refills | Status: DC

## 2017-09-24 MED ORDER — LOSARTAN POTASSIUM 100 MG PO TABS: 100.0000 mg | ORAL_TABLET | Freq: Every day | ORAL | 0 refills | Status: DC

## 2017-09-24 MED ORDER — INSULIN PEN NEEDLE 32G X 4 MM MISC: 1.0000 | Freq: Every day | 0 refills | Status: DC

## 2017-09-24 NOTE — Progress Notes (Signed)
Office: [phone]  /  Fax: [phone]   HPI:   Chief Complaint: OBESITY Darlene [name] is here to discuss her progress with her obesity treatment plan. She is on the keep a food journal with 1300 to 1500 calories and 80+ grams of protein daily and is following her eating plan approximately 60 % of the time. She states she is exercising 0 minutes 0 times per week. [name] has been increasing celebration eating while out of town with her friends. She indulged a bit, but she is ready to get back on track. Her weight is 199 lb (90.3 kg) today and has not lost weight since her last visit. She has lost 26 lbs since starting treatment with [name].  Diabetes II Darlene has a diagnosis of diabetes type II. [name] states fasting BGs range mostly between 80 and90's and she denies any hypoglycemic episodes. Last A1c was at 5.8. Darlene [name] is doing well with medications and diet overall. She has been working on intensive lifestyle modifications including diet, exercise, and weight loss to help control her blood glucose levels.  Hypertension AMANEE [name] is a 67 y.o. female with hypertension. Her blood pressure is stable on Losartan. She stopped taking HCTZ and denies any hypotension or dizziness. She is working weight loss to help control her blood pressure with the goal of decreasing her risk of heart attack and stroke. Margarets blood pressure is currently controlled.  Depression with emotional eating behaviors [name]'s mood is stable on medications. She is still undergoing treatment for metastatic breast cancer, but she seems to be dealing with this appropriately. Darlene [name] struggles with emotional eating and using food for comfort to the extent that it is negatively impacting her health. She often snacks when she is not hungry. Darlene [name] sometimes feels she is out of control and then feels guilty that she made poor food choices. She has been working on behavior modification techniques to help reduce  her emotional eating and has been somewhat successful. She shows no sign of suicidal or homicidal ideations.  Depression screen [facility] 2/9 09/12/2016 08/09/2016 01/01/2016 11/15/2015 10/12/2015  Decreased Interest 0 1 0 0 0  Down, Depressed, Hopeless 0 1 0 0 0  PHQ - 2 Score 0 2 0 0 0  Altered sleeping - 0 - - -  Tired, decreased energy - 1 - - -  Change in appetite - 2 - - -  Feeling bad or failure about yourself  - 2 - - -  Trouble concentrating - 1 - - -  Moving slowly or fidgety/restless - 1 - - -  Suicidal thoughts - 0 - - -  PHQ-9 Score - 9 - - -  Some recent data might be hidden    ALLERGIES: Allergies  Allergen Reactions  . Nsaids Other (See Comments)    H/o gastric bypass - avoid NSAIDs  . Tape Hives, Rash and Other (See Comments)    Adhesives in bandaids    MEDICATIONS: Current Outpatient Medications on File Prior to Visit  Medication Sig Dispense Refill  . acetaminophen (TYLENOL) 325 MG tablet Take 650 mg by mouth 2 (two) times daily as needed for moderate pain or headache.     [name] CALCIUM CITRATE-VITAMIN D PO Take 1 tablet by mouth 3 (three) times daily.     . cholecalciferol (VITAMIN D) 1000 units tablet Take 1,000 Units by mouth daily.    . Cyanocobalamin (VITAMIN B-12 PO) Take 1 tablet by mouth every 14 (fourteen) days.    . diphenhydrAMINE (BENADRYL)  25 MG tablet Take 25 mg by mouth daily as needed for allergies or sleep.     . Insulin Glargine (BASAGLAR KWIKPEN) 100 UNIT/ML SOPN Inject 0.12 mLs (12 Units total) into the skin daily. 5 pen 0  . Melatonin 3 MG CAPS Take 3 mg by mouth at bedtime.     . Pediatric Multivitamins-Iron (FLINTSTONES PLUS IRON) chewable tablet Chew 1 tablet by mouth 2 (two) times daily.    . Probiotic Product ([employer]S PROBIOTIC PO) Take 1 capsule by mouth daily.     [name] Propylene Glycol (SYSTANE BALANCE OP) Place 1-2 drops into both eyes 3 (three) times daily as needed (for dry eyes).     [name] senna-docusate (SENNA PLUS) 8.6-50 MG tablet Take 1 tablet by  mouth daily as needed for moderate constipation.     . vortioxetine HBr (TRINTELLIX) 20 MG TABS Take 20 mg by mouth at bedtime. 30 tablet 0   No current facility-administered medications on file prior to visit.     PAST MEDICAL HISTORY: Past Medical History:  Diagnosis Date  . Anemia    hx of - during 1st round chemo  . Anxiety   . Arthritis    in fingers, shoulders  . Back pain   . Balance problem   . Breast cancer ([place])   . Breast cancer of upper-outer quadrant of right female breast ([place]) 07/22/2014  . Bunion, right foot   . Clumsiness   . Constipation   . Depression   . Diabetes mellitus without complication ([place])    01+ years  . Eating disorder    binge eating  . Epistaxis 08/26/2014  . Fatty liver   . HCAP (healthcare-associated pneumonia) 12/18/2014  . Headache   . Heart murmur    never had any problems  . History of radiation therapy 04/05/15-05/19/15   right chest wall was treated to 50.4 Gy in 28 fractions, right mastectomy scar was treated to 10 Gy in 5 fractions  . Hyperlipidemia   . Hypertension   . Joint pain   . Multiple food allergies   . Neuromuscular disorder ([place])    diabetic neuropathy  . Obesity   . Obstructive sleep apnea on CPAP    uses cpap nightly  . Ovarian cyst rupture    possible  . Pneumonia   . Pneumonia 11/2014  . Radiation 04/05/15-05/19/15   right chest wall 50.4 Gy, mastectomy scar 10 Gy  . Sleep apnea   . Thyroid nodule 06/2014    PAST SURGICAL HISTORY: Past Surgical History:  Procedure Laterality Date  . APPENDECTOMY    . BIOPSY THYROID Bilateral 06/2014   2 nodules - Benign   . BREAST LUMPECTOMY WITH RADIOACTIVE SEED AND SENTINEL LYMPH NODE BIOPSY Right 12/27/2014   Procedure: BREAST LUMPECTOMY WITH RADIOACTIVE SEED AND SENTINEL LYMPH NODE BIOPSY;  Surgeon: Stark Klein, MD;  Location: [place];  Service: General;  Laterality: Right;  . BREAST REDUCTION SURGERY Bilateral 01/06/2015   Procedure: BILATERAL BREAST  REDUCTION, RIGHT ONCOPLASTIC RECONSTRUCTION),LEFT BREAST REDUCTION FOR SYMMETRY;  Surgeon: [name], MD;  Location: [place];  Service: Plastics;  Laterality: Bilateral;  . BREATH TEK H PYLORI N/A 09/15/2012   Procedure: BREATH TEK H PYLORI;  Surgeon: Pedro Earls, MD;  Location: [facility];  Service: General;  Laterality: N/A;  . BUNIONECTOMY Left 12/2013  . CARPAL TUNNEL RELEASE Right   . CARPAL TUNNEL RELEASE Left   . CATARACT EXTRACTION     6/19  . COLONOSCOPY    .  DUPUYTREN CONTRACTURE RELEASE    . GASTRIC ROUX-EN-Y N/A 11/02/2012   Procedure: LAPAROSCOPIC ROUX-EN-Y GASTRIC;  Surgeon: Pedro Earls, MD;  Location: [facility];  Service: General;  Laterality: N/A;  . IR GENERIC HISTORICAL  03/18/2016   IR US GUIDE VASC ACCESS LEFT 03/18/2016 Markus Daft, MD [facility]  . IR GENERIC HISTORICAL  03/18/2016   IR FLUORO GUIDE CV LINE LEFT 03/18/2016 Markus Daft, MD [facility]  . LAPAROSCOPIC APPENDECTOMY N/A 07/22/2015   Procedure: APPENDECTOMY LAPAROSCOPIC;  Surgeon: Michael Boston, MD;  Location: [facility];  Service: General;  Laterality: N/A;  . MASTECTOMY Right 02/15/2015   modified  . MODIFIED MASTECTOMY Right 02/15/2015   Procedure: RIGHT MODIFIED RADICAL MASTECTOMY;  Surgeon: Stark Klein, MD;  Location: [place];  Service: General;  Laterality: Right;  . PORT-A-CATH REMOVAL Left 10/10/2015   Procedure: PORT REMOVAL;  Surgeon: Stark Klein, MD;  Location: [place];  Service: General;  Laterality: Left;  . PORTACATH PLACEMENT Left 08/02/2014   Procedure: INSERTION PORT-A-CATH;  Surgeon: Stark Klein, MD;  Location: [place];  Service: General;  Laterality: Left;  . PORTACATH PLACEMENT N/A 11/05/2016   Procedure: INSERTION PORT-A-CATH;  Surgeon: Stark Klein, MD;  Location: [place];  Service: General;  Laterality: N/A;  . ROTATOR CUFF REPAIR Right 2005  . SCAR REVISION Right 12/08/2015   Procedure: COMPLEX REPAIR RIGHT CHEST 10-15CM;  Surgeon: [name], MD;  Location:  [place];  Service: Plastics;  Laterality: Right;  . TOE SURGERY  1970s   bone spur  . TRIGGER FINGER RELEASE     x3    SOCIAL HISTORY: Social History   Tobacco Use  . Smoking status: Never Smoker  . Smokeless tobacco: Never Used  Substance Use Topics  . Alcohol use: Yes    Alcohol/week: 1.0 standard drinks    Types: 1 Standard drinks or equivalent per week    Comment: social  . Drug use: No    FAMILY HISTORY: Family History  Problem Relation Age of Onset  . CVA Mother   . Heart disease Mother   . Sudden death Mother   . Diabetes Father   . Heart failure Father   . Hypertension Father   . Kidney disease Father   . Obesity Father   . Hypertension Sister   . Diabetes Brother   . Breast cancer Paternal Grandmother   . Diabetes Paternal Uncle     ROS: Review of Systems  Constitutional: Negative for weight loss.  Neurological: Negative for dizziness.  Endo/Heme/Allergies:       Negative for hypoglycemia  Psychiatric/Behavioral: Positive for depression. Negative for suicidal ideas.    PHYSICAL EXAM: Blood pressure 111/65, pulse 66, temperature 98 F (36.7 C), temperature source Oral, height 5\' 9"  (1.753 m), weight 199 lb (90.3 kg), last menstrual period 10/22/2007, SpO2 99 %. Body mass index is 29.39 kg/m. Physical Exam  Constitutional: She is oriented to person, place, and time. She appears well-developed and well-nourished.  Cardiovascular: Normal rate.  Pulmonary/Chest: Effort normal.  Musculoskeletal: Normal range of motion.  Neurological: She is oriented to person, place, and time.  Skin: Skin is warm and dry.  Psychiatric: She has a normal mood and affect.  Vitals reviewed.   RECENT LABS AND TESTS: BMET    Component Value Date/Time   NA 145 09/23/2017 1242   NA 143 03/11/2017 0841   NA 140 01/07/2017 0758   K 3.8 09/23/2017 1242   K 4.1 01/07/2017 0758  CL 113 (H) 09/23/2017 1242   CO2 24 09/23/2017 1242   CO2 24 01/07/2017  0758   GLUCOSE 99 09/23/2017 1242   GLUCOSE 134 01/07/2017 0758   BUN 22 09/23/2017 1242   BUN 40 (H) 03/11/2017 0841   BUN 33.6 (H) 01/07/2017 0758   CREATININE 1.43 (H) 09/23/2017 1242   CREATININE 1.5 (H) 01/07/2017 0758   CALCIUM 9.5 09/23/2017 1242   CALCIUM 9.3 01/07/2017 0758   GFRNONAA 38 (L) 09/23/2017 1242   GFRNONAA 55 (L) 02/18/2013 0827   GFRAA 44 (L) 09/23/2017 1242   GFRAA 63 02/18/2013 0827   Lab Results  Component Value Date   HGBA1C 5.8 (H) 09/03/2017   HGBA1C 6.1 (H) 03/11/2017   HGBA1C 8.2 (H) 11/05/2016   HGBA1C 8.1 (H) 08/09/2016   HGBA1C 6.5 (H) 01/03/2015   Lab Results  Component Value Date   INSULIN 15.3 03/11/2017   INSULIN 18.3 08/09/2016   CBC    Component Value Date/Time   WBC 3.9 09/23/2017 1242   WBC 5.4 07/30/2017 1222   RBC 3.36 (L) 09/23/2017 1242   HGB 10.5 (L) 09/23/2017 1242   HGB 10.8 (L) 01/07/2017 0758   HCT 31.2 (L) 09/23/2017 1242   HCT 31.9 (L) 01/07/2017 0758   PLT 78 (L) 09/23/2017 1242   PLT 126 (L) 01/07/2017 0758   MCV 92.9 09/23/2017 1242   MCV 90.8 01/07/2017 0758   MCH 31.1 09/23/2017 1242   MCHC 33.5 09/23/2017 1242   RDW 13.7 09/23/2017 1242   RDW 14.8 (H) 01/07/2017 0758   LYMPHSABS 1.0 09/23/2017 1242   LYMPHSABS 1.1 01/07/2017 0758   MONOABS 0.3 09/23/2017 1242   MONOABS 0.3 01/07/2017 0758   EOSABS 0.1 09/23/2017 1242   EOSABS 0.1 01/07/2017 0758   EOSABS 0.1 08/09/2016 1229   BASOSABS 0.0 09/23/2017 1242   BASOSABS 0.0 01/07/2017 0758   Iron/TIBC/Ferritin/ %Sat    Component Value Date/Time   IRON 58 02/18/2013 0827   TIBC 305 02/18/2013 0827   FERRITIN 133 10/31/2014 0816   IRONPCTSAT 19 (L) 02/18/2013 0827   Lipid Panel     Component Value Date/Time   CHOL 160 09/03/2017 1349   TRIG 111 09/03/2017 1349   HDL 59 09/03/2017 1349   CHOLHDL 2.9 02/18/2013 0827   VLDL 23 02/18/2013 0827   LDLCALC 79 09/03/2017 1349   Hepatic Function Panel     Component Value Date/Time   PROT 6.7  09/23/2017 1242   PROT 6.9 03/11/2017 0841   PROT 6.7 01/07/2017 0758   ALBUMIN 3.7 09/23/2017 1242   ALBUMIN 4.5 03/11/2017 0841   ALBUMIN 3.6 01/07/2017 0758   AST 48 (H) 09/23/2017 1242   AST 51 (H) 01/07/2017 0758   ALT 57 (H) 09/23/2017 1242   ALT 82 (H) 01/07/2017 0758   ALKPHOS 125 09/23/2017 1242   ALKPHOS 115 01/07/2017 0758   BILITOT 0.7 09/23/2017 1242   BILITOT 0.72 01/07/2017 0758      Component Value Date/Time   TSH 1.54 02/07/2017   TSH 2.170 08/09/2016 1229   Results for Maclaughlin, Ennis L "[name]" (MRN [id number]) as of 09/24/2017 13:50  Ref. Range 09/03/2017 13:49  Vitamin D, 25-Hydroxy Latest Ref Range: 30.0 - 100.0 ng/mL 58.3   ASSESSMENT AND PLAN: Type 2 diabetes mellitus without complication, with long-term current use of insulin (HCC) - Plan: liraglutide (VICTOZA) 18 MG/3ML SOPN, Insulin Pen Needle (BD PEN NEEDLE NANO U/F) 32G X 4 MM MISC, ONETOUCH DELICA LANCETS 65Y MISC, metFORMIN (GLUCOPHAGE) 500 MG  tablet  Essential hypertension - Plan: losartan (COZAAR) 100 MG tablet  Other depression - with emotional eating - Plan: buPROPion (WELLBUTRIN SR) 200 MG 12 hr tablet, topiramate (TOPAMAX) 25 MG tablet  Class 1 obesity with serious comorbidity and body mass index (BMI) of 30.0 to 30.9 in adult, unspecified obesity type - Starting BMI greater then 30  PLAN:  Diabetes II Alberto has been given extensive diabetes education by myself today including ideal fasting and post-prandial blood glucose readings, individual ideal Hgb A1c goals and hypoglycemia prevention. We discussed the importance of good blood sugar control to decrease the likelihood of diabetic complications such as nephropathy, neuropathy, limb loss, blindness, coronary artery disease, and death. We discussed the importance of intensive lifestyle modification including diet, exercise and weight loss as the first line treatment for diabetes. Mae agreed to continue Victoza and Metformin. We will  refill Victoza, Metformin, pen needles and lancets for 1 month and Avie agreed follow up at the agreed upon time.  Hypertension We discussed sodium restriction, working on healthy weight loss, and a regular exercise program as the means to achieve improved blood pressure control. Georgeana agreed with this plan and agreed to follow up as directed. We will continue to monitor her blood pressure as well as her progress with the above lifestyle modifications. She  Agrees to continue Losartan 100 mg qd #30 with no refills and discontinue HCTZ. She will watch for signs of hypotension as she continues her lifestyle modifications.  Depression with Emotional Eating Behaviors We discussed behavior modification techniques today to help Tashanna deal with her emotional eating and depression. She has agreed to continue Wellbutrin SR 200 mg qd #30 with no refills and Topiramate 25 mg daily #30 with no refills and follow up as directed.  Obesity Rakel is currently in the action stage of change. As such, her goal is to continue with weight loss efforts She has agreed to keep a food journal with 1300 to 1500 calories and 80+ grams of protein daily Aggie has been instructed to work up to a goal of 150 minutes of combined cardio and strengthening exercise per week or do walking for 15 minutes 5 times per week for weight loss and overall health benefits. We discussed the following Behavioral Modification Strategies today: increasing lean protein intake, decreasing simple carbohydrates , increasing vegetables and work on meal planning and easy cooking plans  Earlisha has agreed to follow up with our clinic in 3 weeks. She was informed of the importance of frequent follow up visits to maximize her success with intensive lifestyle modifications for her multiple health conditions.   OBESITY BEHAVIORAL INTERVENTION VISIT  Today's visit was # 24  Starting weight: 225 lbs Starting date: 08/09/16 Today's weight  : 199 lbs  Today's date: 09/24/2017 Total lbs lost to date: 26 At least 15 minutes were spent on discussing the following behavioral intervention visit.   ASK: We discussed the diagnosis of obesity with [name] today and Miamor agreed to give [name] permission to discuss obesity behavioral modification therapy today.  ASSESS: Shyrl has the diagnosis of obesity and her BMI today is 29.37 Maryalice is in the action stage of change   ADVISE: Sinclair was educated on the multiple health risks of obesity as well as the benefit of weight loss to improve her health. She was advised of the need for long term treatment and the importance of lifestyle modifications to improve her current health and to decrease her risk of future health  problems.  AGREE: Multiple dietary modification options and treatment options were discussed and  Florice agreed to follow the recommendations documented in the above note.  ARRANGE: Jann was educated on the importance of frequent visits to treat obesity as outlined per CMS and USPSTF guidelines and agreed to schedule her next follow up appointment today.  I, Doreene Nest, am acting as transcriptionist for Dennard Nip, MD  I have reviewed the above documentation for accuracy and completeness, and I agree with the above. -Dennard Nip, MD

## 2017-09-25 ENCOUNTER — Ambulatory Visit: Payer: Medicare Other | Admitting: Podiatry

## 2017-09-25 ENCOUNTER — Ambulatory Visit (INDEPENDENT_AMBULATORY_CARE_PROVIDER_SITE_OTHER): Payer: Medicare Other

## 2017-09-25 ENCOUNTER — Encounter: Payer: Self-pay | Admitting: Podiatry

## 2017-09-25 DIAGNOSIS — S96912S Strain of unspecified muscle and tendon at ankle and foot level, left foot, sequela: Secondary | ICD-10-CM

## 2017-09-25 DIAGNOSIS — S99912A Unspecified injury of left ankle, initial encounter: Secondary | ICD-10-CM

## 2017-09-26 ENCOUNTER — Other Ambulatory Visit (INDEPENDENT_AMBULATORY_CARE_PROVIDER_SITE_OTHER): Payer: Self-pay | Admitting: Family Medicine

## 2017-09-26 ENCOUNTER — Telehealth: Payer: Self-pay | Admitting: *Deleted

## 2017-09-26 DIAGNOSIS — E119 Type 2 diabetes mellitus without complications: Secondary | ICD-10-CM

## 2017-09-26 DIAGNOSIS — S99912A Unspecified injury of left ankle, initial encounter: Secondary | ICD-10-CM

## 2017-09-26 DIAGNOSIS — Z794 Long term (current) use of insulin: Secondary | ICD-10-CM

## 2017-09-26 DIAGNOSIS — S96912S Strain of unspecified muscle and tendon at ankle and foot level, left foot, sequela: Secondary | ICD-10-CM

## 2017-09-26 NOTE — Telephone Encounter (Signed)
Orders to J. Quintana, RN for pre-cert, faxed to [facility]. 

## 2017-09-26 NOTE — Telephone Encounter (Signed)
-----   Message from Trula Slade, DPM sent at 09/25/2017  5:23 PM EDT ----- Can you order an MRI of the left ankle to rule out anterior tibial tendon rupture? Thanks.

## 2017-09-27 ENCOUNTER — Other Ambulatory Visit (INDEPENDENT_AMBULATORY_CARE_PROVIDER_SITE_OTHER): Payer: Self-pay | Admitting: Family Medicine

## 2017-09-27 DIAGNOSIS — F3289 Other specified depressive episodes: Secondary | ICD-10-CM

## 2017-09-27 DIAGNOSIS — E119 Type 2 diabetes mellitus without complications: Secondary | ICD-10-CM

## 2017-09-27 DIAGNOSIS — Z794 Long term (current) use of insulin: Secondary | ICD-10-CM

## 2017-09-28 ENCOUNTER — Encounter: Payer: Self-pay | Admitting: Neurology

## 2017-09-29 ENCOUNTER — Ambulatory Visit: Payer: Medicare Other | Admitting: Neurology

## 2017-09-29 ENCOUNTER — Encounter: Payer: Self-pay | Admitting: Neurology

## 2017-09-29 VITALS — BP 121/73 | HR 75 | Ht 69.0 in | Wt 199.0 lb

## 2017-09-29 DIAGNOSIS — R530 Neoplastic (malignant) related fatigue: Secondary | ICD-10-CM | POA: Insufficient documentation

## 2017-09-29 DIAGNOSIS — Z9989 Dependence on other enabling machines and devices: Secondary | ICD-10-CM

## 2017-09-29 DIAGNOSIS — G4733 Obstructive sleep apnea (adult) (pediatric): Secondary | ICD-10-CM | POA: Diagnosis not present
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
## 2017-09-29 NOTE — Progress Notes (Signed)
Subjective: 67 year old female presents the office today for concerns of a knot on the anterior aspect of the left ankle that is been ongoing for about 1 year.  She states that tripped walking backwards about a year ago and afterwards she developed with a knot on the ankle.  She had no treatment.  She does state that when she walks she does stumble at times she does drag her left foot some.  Denies any systemic complaints such as fevers, chills, nausea, vomiting. No acute changes since last appointment, and no other complaints at this time.   Objective: AAO x3, NAD DP/PT pulses palpable bilaterally, CRT less than 3 seconds In the anterior aspect the left ankle and the tibialis anterior tendon there is a palpable knot present illness a cystic type structure as well.  Overall her strength appears to be adequate but upon weightbearing evaluation and gait evaluation she does drag her left foot across the floor when she walks. No open lesions or pre-ulcerative lesions.  No pain with calf compression, swelling, warmth, erythema  Assessment: Likely old tibialis anterior tendon rupture  Plan: -All treatment options discussed with the patient including all alternatives, risks, complications.  -Due to the concern for the tendon rupture on the left side recommend an MRI to further evaluate this.  Ultimately I do not think that she will be a good surgical candidate we will await the MRI results.  We will likely start with bracing to essentially help with the dropfoot. Will likely start this process after the MRI -Patient encouraged to call the office with any questions, concerns, change in symptoms.   Trula Slade DPM

## 2017-10-14 ENCOUNTER — Inpatient Hospital Stay: Payer: Medicare Other

## 2017-10-14 VITALS — BP 148/74 | HR 62 | Temp 97.8°F | Resp 18 | Ht 69.0 in | Wt 200.5 lb

## 2017-10-14 DIAGNOSIS — C787 Secondary malignant neoplasm of liver and intrahepatic bile duct: Secondary | ICD-10-CM

## 2017-10-14 DIAGNOSIS — Z171 Estrogen receptor negative status [ER-]: Secondary | ICD-10-CM

## 2017-10-14 DIAGNOSIS — C50411 Malignant neoplasm of upper-outer quadrant of right female breast: Secondary | ICD-10-CM

## 2017-10-14 DIAGNOSIS — Z5112 Encounter for antineoplastic immunotherapy: Secondary | ICD-10-CM | POA: Diagnosis not present

## 2017-10-14 DIAGNOSIS — Z95828 Presence of other vascular implants and grafts: Secondary | ICD-10-CM

## 2017-10-14 LAB — CMP (CANCER CENTER ONLY)
ALT: 70 U/L — ABNORMAL HIGH (ref 0–44)
AST: 61 U/L — ABNORMAL HIGH (ref 15–41)
Albumin: 3.7 g/dL (ref 3.5–5.0)
Alkaline Phosphatase: 135 U/L — ABNORMAL HIGH (ref 38–126)
Anion gap: 8 (ref 5–15)
BUN: 24 mg/dL — ABNORMAL HIGH (ref 8–23)
CO2: 23 mmol/L (ref 22–32)
Calcium: 9.4 mg/dL (ref 8.9–10.3)
Chloride: 112 mmol/L — ABNORMAL HIGH (ref 98–111)
Creatinine: 1.49 mg/dL — ABNORMAL HIGH (ref 0.44–1.00)
GFR, Est AFR Am: 41 mL/min — ABNORMAL LOW (ref >60)
GFR, Estimated: 36 mL/min — ABNORMAL LOW (ref >60)
Glucose, Bld: 145 mg/dL — ABNORMAL HIGH (ref 70–99)
Potassium: 4 mmol/L (ref 3.5–5.1)
Sodium: 143 mmol/L (ref 135–145)
Total Bilirubin: 0.8 mg/dL (ref 0.3–1.2)
Total Protein: 6.7 g/dL (ref 6.5–8.1)

## 2017-10-14 LAB — CBC WITH DIFFERENTIAL (CANCER CENTER ONLY)
Basophils Absolute: 0 10*3/uL (ref 0.0–0.1)
Basophils Relative: 0 %
Eosinophils Absolute: 0.1 10*3/uL (ref 0.0–0.5)
Eosinophils Relative: 3 %
HCT: 31.2 % — ABNORMAL LOW (ref 34.8–46.6)
Hemoglobin: 10.5 g/dL — ABNORMAL LOW (ref 11.6–15.9)
Lymphocytes Relative: 25 %
Lymphs Abs: 0.8 10*3/uL — ABNORMAL LOW (ref 0.9–3.3)
MCH: 31.3 pg (ref 25.1–34.0)
MCHC: 33.7 g/dL (ref 31.5–36.0)
MCV: 92.9 fL (ref 79.5–101.0)
Monocytes Absolute: 0.3 10*3/uL (ref 0.1–0.9)
Monocytes Relative: 8 %
Neutro Abs: 2.1 10*3/uL (ref 1.5–6.5)
Neutrophils Relative %: 64 %
Platelet Count: 60 10*3/uL — ABNORMAL LOW (ref 145–400)
RBC: 3.36 MIL/uL — ABNORMAL LOW (ref 3.70–5.45)
RDW: 14 % (ref 11.2–14.5)
WBC Count: 3.4 10*3/uL — ABNORMAL LOW (ref 3.9–10.3)

## 2017-10-14 MED ORDER — HEPARIN SOD (PORK) LOCK FLUSH 100 UNIT/ML IV SOLN
500.0000 [IU] | Freq: Once | INTRAVENOUS | Status: AC | PRN
  Administered 2017-10-14: 500 [IU]
  Filled 2017-10-14: qty 5

## 2017-10-14 MED ORDER — SODIUM CHLORIDE 0.9% FLUSH
10.0000 mL | INTRAVENOUS | Status: DC | PRN
  Administered 2017-10-14: 10 mL
  Filled 2017-10-14: qty 10

## 2017-10-14 MED ORDER — SODIUM CHLORIDE 0.9 % IJ SOLN
10.0000 mL | INTRAMUSCULAR | Status: DC | PRN
  Administered 2017-10-14: 10 mL via INTRAVENOUS
  Filled 2017-10-14: qty 10

## 2017-10-14 NOTE — Patient Instructions (Signed)
Thrombocytopenia Thrombocytopenia means that you have a low number of platelets in your blood. Platelets are tiny cells in the blood. When you bleed, they clump together at the cut or injury to stop the bleeding. This is called blood clotting. Not having enough platelets can cause bleeding problems. Follow these instructions at home: General instructions  Check your skin and inside your mouth for bruises or blood as told by your doctor.  Check to see if there is blood in your spit (sputum), pee (urine), and poop (stool). Do this as told by your doctor.  Ask your doctor if you can drink alcohol.  Take over-the-counter and prescription medicines only as told by your doctor.  Tell all of your doctors that you have this condition. Be sure to tell your dentist and eye doctor too. Activity  Do not do activities that can cause bumps or bruises until your doctor says it is okay.  Be careful not to cut yourself: ? When you shave. ? When you use scissors, needles, knives, or other tools.  Be careful not to burn yourself: ? When you use an iron. ? When you cook. Contact a doctor if:  You have bruises and you do not know why. Get help right away if:  You are bleeding anywhere on your body.  You have blood in your spit, pee, or poop. This information is not intended to replace advice given to you by your health care provider. Make sure you discuss any questions you have with your health care provider. Document Released: 12/27/2010 Document Revised: 09/10/2015 Document Reviewed: 07/11/2014 Elsevier Interactive Patient Education  2018 Elsevier Inc.   

## 2017-10-14 NOTE — Progress Notes (Signed)
Per Dr. Lindi Adie: pt will not receive Kadcyla infusion today d/t continued decreasing platelet level.   Val Dodd-RN came to infusion room to explain plan to pt and pt's husband, as well as let pt know Dr. Jana Hakim will try to see pt next week 10/21/17 after pt's MRI appts. Pt and husband verbalized understanding and agreement. Pt knows to call clinic in the mean time should she have any questions/concerns before next week.

## 2017-10-15 ENCOUNTER — Ambulatory Visit (INDEPENDENT_AMBULATORY_CARE_PROVIDER_SITE_OTHER): Payer: Medicare Other | Admitting: Family Medicine

## 2017-10-15 VITALS — BP 123/72 | HR 70 | Temp 97.8°F | Ht 69.0 in | Wt 196.0 lb

## 2017-10-15 DIAGNOSIS — Z6830 Body mass index (BMI) 30.0-30.9, adult: Secondary | ICD-10-CM

## 2017-10-15 DIAGNOSIS — E669 Obesity, unspecified: Secondary | ICD-10-CM | POA: Diagnosis not present

## 2017-10-15 DIAGNOSIS — E119 Type 2 diabetes mellitus without complications: Secondary | ICD-10-CM

## 2017-10-15 DIAGNOSIS — Z794 Long term (current) use of insulin: Secondary | ICD-10-CM | POA: Diagnosis not present

## 2017-10-15 DIAGNOSIS — F3289 Other specified depressive episodes: Secondary | ICD-10-CM

## 2017-10-15 MED ORDER — METFORMIN HCL 500 MG PO TABS: 500.0000 mg | ORAL_TABLET | Freq: Every day | ORAL | 0 refills | Status: DC

## 2017-10-15 MED ORDER — BUPROPION HCL ER (SR) 200 MG PO TB12: ORAL_TABLET | ORAL | 0 refills | Status: DC

## 2017-10-15 MED ORDER — TOPIRAMATE 25 MG PO TABS: 25.0000 mg | ORAL_TABLET | Freq: Every day | ORAL | 0 refills | Status: DC

## 2017-10-16 NOTE — Progress Notes (Signed)
Office: [phone]  /  Fax: [phone]   HPI:   Chief Complaint: OBESITY Darlene [name] is here to discuss her progress with her obesity treatment plan. She is on the  keep a food journal with 1300-1500 calories and 80g protein  and is following her eating plan approximately 70 % of the time. She states she is exercising by doing water aerobics for 45 minutes 3 times per week. Darlene [name] is still undergoing chemotherapy for breast cancer mets to liver. She has started back to water aerobics and feels well. She is sleeping well and working on eating healthy.   Her weight is 196 lb (88.9 kg) today and has had a weight loss of 3 pounds over a period of 3 weeks since her last visit. She has lost 29 lbs since starting treatment with [name].  Diabetes II Darlene [name] has a diagnosis of diabetes type II. Deionna states fasting BGs range between 79 and 105 and denies any hypoglycemic episodes. She is currently on Metformin, Basaglar, and Victoza. Last A1c was 5.8. She has been working on intensive lifestyle modifications including diet, exercise, and weight loss to help control her blood glucose levels.  Depression with emotional eating behaviors Darlene [name] is struggling with emotional eating and using food for comfort to the extent that it is negatively impacting her health. She often snacks when she is not hungry. Darlene [name] sometimes feels she is out of control and then feels guilty that she made poor food choices. She has been working on behavior modification techniques to help reduce her emotional eating and has been somewhat successful. Her mood is stable on medications and emotional eating is decreased. Her blood pressure is stable.  She shows no sign of suicidal or homicidal ideations.  Depression screen [facility] 2/9 09/12/2016 08/09/2016 01/01/2016 11/15/2015 10/12/2015  Decreased Interest 0 1 0 0 0  Down, Depressed, Hopeless 0 1 0 0 0  PHQ - 2 Score 0 2 0 0 0  Altered sleeping - 0 - - -  Tired, decreased energy  - 1 - - -  Change in appetite - 2 - - -  Feeling bad or failure about yourself  - 2 - - -  Trouble concentrating - 1 - - -  Moving slowly or fidgety/restless - 1 - - -  Suicidal thoughts - 0 - - -  PHQ-9 Score - 9 - - -  Some recent data might be hidden    ALLERGIES: Allergies  Allergen Reactions  . Nsaids Other (See Comments)    H/o gastric bypass - avoid NSAIDs  . Tape Hives, Rash and Other (See Comments)    Adhesives in bandaids    MEDICATIONS: Current Outpatient Medications on File Prior to Visit  Medication Sig Dispense Refill  . acetaminophen (TYLENOL) 325 MG tablet Take 650 mg by mouth 2 (two) times daily as needed for moderate pain or headache.     [name] CALCIUM CITRATE-VITAMIN D PO Take 1 tablet by mouth 3 (three) times daily.     . cholecalciferol (VITAMIN D) 1000 units tablet Take 1,000 Units by mouth daily.    . Cyanocobalamin (VITAMIN B-12 PO) Take 1 tablet by mouth every 14 (fourteen) days.    . diphenhydrAMINE (BENADRYL) 25 MG tablet Take 25 mg by mouth daily as needed for allergies or sleep.     . Insulin Glargine (BASAGLAR KWIKPEN) 100 UNIT/ML SOPN INJECT 0.12 MLS (12 UNITS TOTAL) INTO THE SKIN DAILY. 5 pen 0  . Insulin Pen Needle (BD PEN NEEDLE NANO  U/F) 32G X 4 MM MISC 1 Package by Does not apply route daily at 12 noon. 100 each 0  . liraglutide (VICTOZA) 18 MG/3ML SOPN Inject 0.3 mLs (1.8 mg total) into the skin every morning. 3 pen 0  . losartan (COZAAR) 100 MG tablet Take 1 tablet (100 mg total) by mouth daily. 30 tablet 0  . Melatonin 3 MG CAPS Take 3 mg by mouth at bedtime.     Glory Rosebush DELICA LANCETS 17E MISC 1 Package by Does not apply route daily. 100 each 0  . Pediatric Multivitamins-Iron (FLINTSTONES PLUS IRON) chewable tablet Chew 1 tablet by mouth 2 (two) times daily.    . Probiotic Product ([employer] PROBIOTIC PO) Take 1 capsule by mouth daily.     [name] Propylene Glycol (SYSTANE BALANCE OP) Place 1-2 drops into both eyes 3 (three) times daily as needed (for dry  eyes).     [name] senna-docusate (SENNA PLUS) 8.6-50 MG tablet Take 1 tablet by mouth daily as needed for moderate constipation.     . vortioxetine HBr (TRINTELLIX) 20 MG TABS Take 20 mg by mouth at bedtime. 30 tablet 0   No current facility-administered medications on file prior to visit.     PAST MEDICAL HISTORY: Past Medical History:  Diagnosis Date  . Anemia    hx of - during 1st round chemo  . Anxiety   . Arthritis    in fingers, shoulders  . Back pain   . Balance problem   . Breast cancer ([place])   . Breast cancer of upper-outer quadrant of right female breast ([place]) 07/22/2014  . Bunion, right foot   . Clumsiness   . Constipation   . Depression   . Diabetes mellitus without complication ([place])    08+ years  . Eating disorder    binge eating  . Epistaxis 08/26/2014  . Fatty liver   . HCAP (healthcare-associated pneumonia) 12/18/2014  . Headache   . Heart murmur    never had any problems  . History of radiation therapy 04/05/15-05/19/15   right chest wall was treated to 50.4 Gy in 28 fractions, right mastectomy scar was treated to 10 Gy in 5 fractions  . Hyperlipidemia   . Hypertension   . Joint pain   . Multiple food allergies   . Neuromuscular disorder ([place])    diabetic neuropathy  . Obesity   . Obstructive sleep apnea on CPAP    uses cpap nightly  . Ovarian cyst rupture    possible  . Pneumonia   . Pneumonia 11/2014  . Radiation 04/05/15-05/19/15   right chest wall 50.4 Gy, mastectomy scar 10 Gy  . Sleep apnea   . Thyroid nodule 06/2014    PAST SURGICAL HISTORY: Past Surgical History:  Procedure Laterality Date  . APPENDECTOMY    . BIOPSY THYROID Bilateral 06/2014   2 nodules - Benign   . BREAST LUMPECTOMY WITH RADIOACTIVE SEED AND SENTINEL LYMPH NODE BIOPSY Right 12/27/2014   Procedure: BREAST LUMPECTOMY WITH RADIOACTIVE SEED AND SENTINEL LYMPH NODE BIOPSY;  Surgeon: Stark Klein, MD;  Location: [facility];  Service: General;  Laterality: Right;  .  BREAST REDUCTION SURGERY Bilateral 01/06/2015   Procedure: BILATERAL BREAST REDUCTION, RIGHT ONCOPLASTIC RECONSTRUCTION),LEFT BREAST REDUCTION FOR SYMMETRY;  Surgeon: [name], MD;  Location: [facility];  Service: Plastics;  Laterality: Bilateral;  . BREATH TEK H PYLORI N/A 09/15/2012   Procedure: BREATH TEK H PYLORI;  Surgeon: Pedro Earls, MD;  Location: [facility];  Service: General;  Laterality: N/A;  . BUNIONECTOMY Left 12/2013  . CARPAL TUNNEL RELEASE Right   . CARPAL TUNNEL RELEASE Left   . CATARACT EXTRACTION     6/19  . COLONOSCOPY    . DUPUYTREN CONTRACTURE RELEASE    . GASTRIC ROUX-EN-Y N/A 11/02/2012   Procedure: LAPAROSCOPIC ROUX-EN-Y GASTRIC;  Surgeon: Pedro Earls, MD;  [facility]: [facility];  Service: General;  Laterality: N/A;  . IR GENERIC HISTORICAL  03/18/2016   IR US GUIDE VASC ACCESS LEFT 03/18/2016 Markus Daft, MD [facility]  . IR GENERIC HISTORICAL  03/18/2016   IR FLUORO GUIDE CV LINE LEFT 03/18/2016 Markus Daft, MD [facility]  . LAPAROSCOPIC APPENDECTOMY N/A 07/22/2015   Procedure: APPENDECTOMY LAPAROSCOPIC;  Surgeon: Michael Boston, MD;  [facility]: [facility];  Service: General;  Laterality: N/A;  . MASTECTOMY Right 02/15/2015   modified  . MODIFIED MASTECTOMY Right 02/15/2015   Procedure: RIGHT MODIFIED RADICAL MASTECTOMY;  Surgeon: Stark Klein, MD;  Location: [facility];  Service: General;  Laterality: Right;  . PORT-A-CATH REMOVAL Left 10/10/2015   Procedure: PORT REMOVAL;  Surgeon: Stark Klein, MD;  Location: [facility];  Service: General;  Laterality: Left;  . PORTACATH PLACEMENT Left 08/02/2014   Procedure: INSERTION PORT-A-CATH;  Surgeon: Stark Klein, MD;  Location: [facility];  Service: General;  Laterality: Left;  . PORTACATH PLACEMENT N/A 11/05/2016   Procedure: INSERTION PORT-A-CATH;  Surgeon: Stark Klein, MD;  Location: [facility];  Service: General;  Laterality: N/A;  . ROTATOR CUFF REPAIR Right 2005  . SCAR REVISION Right 12/08/2015   Procedure:  COMPLEX REPAIR RIGHT CHEST 10-15CM;  Surgeon: [name], MD;  Location: [facility];  Service: Plastics;  Laterality: Right;  . TOE SURGERY  1970s   bone spur  . TRIGGER FINGER RELEASE     x3    SOCIAL HISTORY: Social History   Tobacco Use  . Smoking status: Never Smoker  . Smokeless tobacco: Never Used  Substance Use Topics  . Alcohol use: Yes    Alcohol/week: 1.0 standard drinks    Types: 1 Standard drinks or equivalent per week    Comment: social  . Drug use: No    FAMILY HISTORY: Family History  Problem Relation Age of Onset  . CVA Mother   . Heart disease Mother   . Sudden death Mother   . Diabetes Father   . Heart failure Father   . Hypertension Father   . Kidney disease Father   . Obesity Father   . Hypertension Sister   . Diabetes Brother   . Breast cancer Paternal Grandmother   . Diabetes Paternal Uncle     ROS: Review of Systems  Constitutional: Positive for weight loss.  Endo/Heme/Allergies:       Negative for hypoglycemia  Psychiatric/Behavioral: Positive for depression. Negative for suicidal ideas.       Negative for homicidal ideations     PHYSICAL EXAM: Blood pressure 123/72, pulse 70, temperature 97.8 F (36.6 C), temperature source Oral, height 5\' 9"  (1.753 m), weight 196 lb (88.9 kg), last menstrual period 10/22/2007, SpO2 100 %. Body mass index is 28.94 kg/m. Physical Exam  Constitutional: She is oriented to person, place, and time. She appears well-developed and well-nourished.  HENT:  Head: Normocephalic.  Cardiovascular: Normal rate.  Pulmonary/Chest: Effort normal.  Musculoskeletal: Normal range of motion.  Neurological: She is alert and oriented to person, place, and time.  Skin: Skin is warm and dry.  Psychiatric: She has  a normal mood and affect. Her behavior is normal.  Nursing note and vitals reviewed.   RECENT LABS AND TESTS: BMET    Component Value Date/Time   NA 143 10/14/2017 0811   NA 143  03/11/2017 0841   NA 140 01/07/2017 0758   K 4.0 10/14/2017 0811   K 4.1 01/07/2017 0758   CL 112 (H) 10/14/2017 0811   CO2 23 10/14/2017 0811   CO2 24 01/07/2017 0758   GLUCOSE 145 (H) 10/14/2017 0811   GLUCOSE 134 01/07/2017 0758   BUN 24 (H) 10/14/2017 0811   BUN 40 (H) 03/11/2017 0841   BUN 33.6 (H) 01/07/2017 0758   CREATININE 1.49 (H) 10/14/2017 0811   CREATININE 1.5 (H) 01/07/2017 0758   CALCIUM 9.4 10/14/2017 0811   CALCIUM 9.3 01/07/2017 0758   GFRNONAA 36 (L) 10/14/2017 0811   GFRNONAA 55 (L) 02/18/2013 0827   GFRAA 41 (L) 10/14/2017 0811   GFRAA 63 02/18/2013 0827   Lab Results  Component Value Date   HGBA1C 5.8 (H) 09/03/2017   HGBA1C 6.1 (H) 03/11/2017   HGBA1C 8.2 (H) 11/05/2016   HGBA1C 8.1 (H) 08/09/2016   HGBA1C 6.5 (H) 01/03/2015   Lab Results  Component Value Date   INSULIN 15.3 03/11/2017   INSULIN 18.3 08/09/2016   CBC    Component Value Date/Time   WBC 3.4 (L) 10/14/2017 0811   WBC 5.4 07/30/2017 1222   RBC 3.36 (L) 10/14/2017 0811   HGB 10.5 (L) 10/14/2017 0811   HGB 10.8 (L) 01/07/2017 0758   HCT 31.2 (L) 10/14/2017 0811   HCT 31.9 (L) 01/07/2017 0758   PLT 60 (L) 10/14/2017 0811   PLT 126 (L) 01/07/2017 0758   MCV 92.9 10/14/2017 0811   MCV 90.8 01/07/2017 0758   MCH 31.3 10/14/2017 0811   MCHC 33.7 10/14/2017 0811   RDW 14.0 10/14/2017 0811   RDW 14.8 (H) 01/07/2017 0758   LYMPHSABS 0.8 (L) 10/14/2017 0811   LYMPHSABS 1.1 01/07/2017 0758   MONOABS 0.3 10/14/2017 0811   MONOABS 0.3 01/07/2017 0758   EOSABS 0.1 10/14/2017 0811   EOSABS 0.1 01/07/2017 0758   EOSABS 0.1 08/09/2016 1229   BASOSABS 0.0 10/14/2017 0811   BASOSABS 0.0 01/07/2017 0758   Iron/TIBC/Ferritin/ %Sat    Component Value Date/Time   IRON 58 02/18/2013 0827   TIBC 305 02/18/2013 0827   FERRITIN 133 10/31/2014 0816   IRONPCTSAT 19 (L) 02/18/2013 0827   Lipid Panel     Component Value Date/Time   CHOL 160 09/03/2017 1349   TRIG 111 09/03/2017 1349   HDL  59 09/03/2017 1349   CHOLHDL 2.9 02/18/2013 0827   VLDL 23 02/18/2013 0827   LDLCALC 79 09/03/2017 1349   Hepatic Function Panel     Component Value Date/Time   PROT 6.7 10/14/2017 0811   PROT 6.9 03/11/2017 0841   PROT 6.7 01/07/2017 0758   ALBUMIN 3.7 10/14/2017 0811   ALBUMIN 4.5 03/11/2017 0841   ALBUMIN 3.6 01/07/2017 0758   AST 61 (H) 10/14/2017 0811   AST 51 (H) 01/07/2017 0758   ALT 70 (H) 10/14/2017 0811   ALT 82 (H) 01/07/2017 0758   ALKPHOS 135 (H) 10/14/2017 0811   ALKPHOS 115 01/07/2017 0758   BILITOT 0.8 10/14/2017 0811   BILITOT 0.72 01/07/2017 0758      Component Value Date/Time   TSH 1.54 02/07/2017   TSH 2.170 08/09/2016 1229    ASSESSMENT AND PLAN: Type 2 diabetes mellitus without complication, with  long-term current use of insulin ([place]) - Plan: metFORMIN (GLUCOPHAGE) 500 MG tablet  Other depression - with emotional eating - Plan: buPROPion (WELLBUTRIN SR) 200 MG 12 hr tablet, topiramate (TOPAMAX) 25 MG tablet  Class 1 obesity with serious comorbidity and body mass index (BMI) of 30.0 to 30.9 in adult, unspecified obesity type - Starting BMI greater then 30  PLAN: Diabetes II Darlene [name] has been given extensive diabetes education by myself today including ideal fasting and post-prandial blood glucose readings, individual ideal HgA1c goals  and hypoglycemia prevention. We discussed the importance of good blood sugar control to decrease the likelihood of diabetic complications such as nephropathy, neuropathy, limb loss, blindness, coronary artery disease, and death. We discussed the importance of intensive lifestyle modification including diet, exercise and weight loss as the first line treatment for diabetes. Darlene [name] agrees to continue her diabetes medications. Refill given today Metformin 500 mg daily #30 with no refills and will follow up at the agreed upon time.  Depression with Emotional Eating Behaviors We discussed behavior modification techniques today  to help Darlene [name] deal with her emotional eating and depression. She has agreed to continue  Wellbutrin SR 150 mg qd #30 with no refills and Topamax 25 mg daily with no refills. She agreed to follow up as directed.  Obesity Darlene [name] is currently in the action stage of change. As such, her goal is to continue with weight loss efforts She has agreed to keep a food journal with 1300-1500 calories and 80+ g  protein  Pat has been instructed to work up to a goal of 150 minutes of combined cardio and strengthening exercise per week for weight loss and overall health benefits. We discussed the following Behavioral Modification Strategies today: increasing lean protein intake, decreasing simple carbohydrates  and work on meal planning and easy cooking plans   Darlene [name] has agreed to follow up with our clinic in 3 weeks. She was informed of the importance of frequent follow up visits to maximize her success with intensive lifestyle modifications for her multiple health conditions.   OBESITY BEHAVIORAL INTERVENTION VISIT  Today's visit was # 25  Starting weight: 225 lb Starting date: 09/24/17 Today's weight : 196 lb Today's date: 10/15/17 Total lbs lost to date: 29 lb At least 15 minutes were spent on discussing the following behavioral intervention visit.   ASK: We discussed the diagnosis of obesity with Darlene [name] today and Darlene [name] agreed to give [name] permission to discuss obesity behavioral modification therapy today.  ASSESS: Darlene [name] has the diagnosis of obesity and her BMI today is 28.93 Jenna is in the action stage of change   ADVISE: Darlene [name] was educated on the multiple health risks of obesity as well as the benefit of weight loss to improve her health. She was advised of the need for long term treatment and the importance of lifestyle modifications to improve her current health and to decrease her risk of future health problems.  AGREE: Multiple dietary modification  options and treatment options were discussed and  Darlene [name] agreed to follow the recommendations documented in the above note.  ARRANGE: [place] was educated on the importance of frequent visits to treat obesity as outlined per CMS and USPSTF guidelines and agreed to schedule her next follow up appointment today.  I, Renee Ramus, am acting as transcriptionist for Dennard Nip, MD   I have reviewed the above documentation for accuracy and completeness, and I agree with the above. -Dennard Nip, MD

## 2017-10-17 ENCOUNTER — Other Ambulatory Visit (INDEPENDENT_AMBULATORY_CARE_PROVIDER_SITE_OTHER): Payer: Self-pay | Admitting: Family Medicine

## 2017-10-17 DIAGNOSIS — E1121 Type 2 diabetes mellitus with diabetic nephropathy: Secondary | ICD-10-CM

## 2017-10-17 DIAGNOSIS — F3289 Other specified depressive episodes: Secondary | ICD-10-CM

## 2017-10-17 DIAGNOSIS — Z794 Long term (current) use of insulin: Secondary | ICD-10-CM

## 2017-10-20 ENCOUNTER — Other Ambulatory Visit: Payer: Self-pay | Admitting: Oncology

## 2017-10-20 NOTE — Progress Notes (Unsigned)
Darlene [name] has had 3T-DM1 treatments, with steadily falling platelets.  She is being restaged.  We may consider either going back to T-DM 1 at 4 weeks instead of 3, or going back to trastuzumab and repeating an MRI in 3 months, or continuing the T-DM 1 with close follow-up of the platelet count, depending on the MRI results.

## 2017-10-21 ENCOUNTER — Ambulatory Visit
Admission: RE | Admit: 2017-10-21 | Discharge: 2017-10-21 | Disposition: A | Discharge status: Home / Self Care | Payer: Medicare Other | Source: Ambulatory Visit | Attending: Podiatry | Admitting: Podiatry

## 2017-10-21 ENCOUNTER — Ambulatory Visit
Admission: RE | Admit: 2017-10-21 | Discharge: 2017-10-21 | Disposition: A | Discharge status: Home / Self Care | Payer: Medicare Other | Source: Ambulatory Visit | Attending: Oncology | Admitting: Oncology

## 2017-10-21 DIAGNOSIS — C787 Secondary malignant neoplasm of liver and intrahepatic bile duct: Secondary | ICD-10-CM

## 2017-10-21 DIAGNOSIS — Z171 Estrogen receptor negative status [ER-]: Secondary | ICD-10-CM

## 2017-10-21 DIAGNOSIS — C50911 Malignant neoplasm of unspecified site of right female breast: Secondary | ICD-10-CM

## 2017-10-21 DIAGNOSIS — C50411 Malignant neoplasm of upper-outer quadrant of right female breast: Secondary | ICD-10-CM

## 2017-10-21 MED ORDER — GADOBENATE DIMEGLUMINE 529 MG/ML IV SOLN
9.0000 mL | Freq: Once | INTRAVENOUS | Status: AC | PRN
  Administered 2017-10-21: 9 mL via INTRAVENOUS

## 2017-10-22 ENCOUNTER — Encounter: Payer: Self-pay | Admitting: Adult Health

## 2017-10-22 ENCOUNTER — Inpatient Hospital Stay: Payer: Medicare Other | Attending: Oncology | Admitting: Adult Health

## 2017-10-22 VITALS — BP 126/54 | HR 80 | Temp 98.0°F | Resp 18 | Ht 69.0 in | Wt 198.7 lb

## 2017-10-22 DIAGNOSIS — Z23 Encounter for immunization: Secondary | ICD-10-CM | POA: Diagnosis not present

## 2017-10-22 DIAGNOSIS — Z5112 Encounter for antineoplastic immunotherapy: Secondary | ICD-10-CM | POA: Diagnosis not present

## 2017-10-22 DIAGNOSIS — Z452 Encounter for adjustment and management of vascular access device: Secondary | ICD-10-CM | POA: Insufficient documentation

## 2017-10-22 DIAGNOSIS — C50411 Malignant neoplasm of upper-outer quadrant of right female breast: Secondary | ICD-10-CM | POA: Diagnosis present

## 2017-10-22 DIAGNOSIS — C787 Secondary malignant neoplasm of liver and intrahepatic bile duct: Secondary | ICD-10-CM

## 2017-10-22 DIAGNOSIS — C773 Secondary and unspecified malignant neoplasm of axilla and upper limb lymph nodes: Secondary | ICD-10-CM | POA: Insufficient documentation

## 2017-10-22 DIAGNOSIS — K769 Liver disease, unspecified: Secondary | ICD-10-CM | POA: Diagnosis not present

## 2017-10-22 DIAGNOSIS — Z171 Estrogen receptor negative status [ER-]: Secondary | ICD-10-CM | POA: Diagnosis not present

## 2017-10-22 NOTE — Progress Notes (Addendum)
[place]  [phone] [phone]   ID: Darlene [name] DOB: 22-Aug-1950  MR#: [id number]  BWG#:[id number]  Patient Care Team: Vernie Shanks, MD as PCP - General (Family Medicine) Himmelrich, Bryson Ha, RD (Inactive) as Dietitian (Bariatrics) Stark Klein, MD as Consulting Physician (General Surgery) Magrinat, Virgie Dad, MD as Consulting Physician (Oncology) Arloa Koh, MD as Consulting Physician (Radiation Oncology) Mauro Kaufmann, RN as Registered Nurse Rockwell Germany, RN as Registered Nurse Jacelyn Pi, MD as Consulting Physician (Endocrinology) Kem Boroughs, [place] as Nurse Practitioner (Family Medicine) [name], MD as Consulting Physician (Plastic Surgery) Larey Dresser, MD as Consulting Physician (Cardiology) Gery Pray, MD as Consulting Physician (Radiation Oncology) PCP: Vernie Shanks, MD OTHER MD:  CHIEF COMPLAINT:  HER-2 positive, estrogen receptor negative locally recurrent disease  CURRENT TREATMENT:  T-DM1  INTERVAL HISTORY:  Darlene [name] returns today for follow-up and treatment of her estrogen receptor negative but HER-2 amplified breast cancer accompanied by her husband. She is here to review her most recent liver MRI results from 10/1 and to also discuss her thrombocytopenia, leading to her TDM1 being held on 10/14/17.  Her platelets were 60.  She notes some blood tinged mucous occasionally in the morning when she clears her throat.  She denies any easy bruising.  Her most recent echocardiogram was 04/24/2017 and showed an ejection fraction of 55-60%. She is followed by Dr. Aundra Dubin every  6 months and will see him back tomorrow. Darlene [name] had bilateral cataract surgery back in July and August, 2019.   REVIEW OF SYSTEMS: Darlene [name] is doing well today.  She is at 198 pounds.  She is doing this intentionally with Dr. Leafy Ro.  She denies any new pain, chest pain, fevers, chills, shortness of breath, cough, palpitations,  bowel/bladder concerns, nausea, vomiting.  A detailed ROS was non contributory.    BREAST CANCER HISTORY: From the original intake note:  "Darlene [name]" had screening mammography at her gynecologist suggestive of a possible abnormality in the right breast. However right diagnostic mammogram 04/12/2013 at [facility] was negative. More recently however, she noted a lump in her right breast and brought it to her gynecologist's attention. On 07/18/2014 the patient had bilateral diagnostic mammography with tomosynthesis at [facility]. The breast density was category B. In the right breast upper outer quadrant there was an area of focal asymmetry with indistinct margins.[name] Ultrasound was obtained and showed in addition to the mass in question, measuring 1.7 cm, an abnormal-appearing lymph node in the right axillary tail.  On 07/19/2014 the patient underwent biopsy of the right breast massin question as well as a right axillary lymph node. Both were positive for invasive ductal carcinoma, grade 2, estrogen and progesterone receptor negative, with an MIB-1 of 90%, and HER-2 pending. Incidentally the lymph node biopsy showed a lymphocytic inflammatory component but no lymph node architecture.  Her subsequent history is as detailed below.  PAST MEDICAL HISTORY: Past Medical History:  Diagnosis Date  . Anemia    hx of - during 1st round chemo  . Anxiety   . Arthritis    in fingers, shoulders  . Back pain   . Balance problem   . Breast cancer ([place])   . Breast cancer of upper-outer quadrant of right female breast ([place]) 07/22/2014  . Bunion, right foot   . Clumsiness   . Constipation   . Depression   . Diabetes mellitus without complication ([place])    17+ years  . Eating disorder  binge eating  . Epistaxis 08/26/2014  . Fatty liver   . HCAP (healthcare-associated pneumonia) 12/18/2014  . Headache   . Heart murmur    never had any problems  . History of radiation therapy 04/05/15-05/19/15   right chest wall was  treated to 50.4 Gy in 28 fractions, right mastectomy scar was treated to 10 Gy in 5 fractions  . Hyperlipidemia   . Hypertension   . Joint pain   . Multiple food allergies   . Neuromuscular disorder ([place])    diabetic neuropathy  . Obesity   . Obstructive sleep apnea on CPAP    uses cpap nightly  . Ovarian cyst rupture    possible  . Pneumonia   . Pneumonia 11/2014  . Radiation 04/05/15-05/19/15   right chest wall 50.4 Gy, mastectomy scar 10 Gy  . Sleep apnea   . Thyroid nodule 06/2014    PAST SURGICAL HISTORY: Past Surgical History:  Procedure Laterality Date  . APPENDECTOMY    . BIOPSY THYROID Bilateral 06/2014   2 nodules - Benign   . BREAST LUMPECTOMY WITH RADIOACTIVE SEED AND SENTINEL LYMPH NODE BIOPSY Right 12/27/2014   Procedure: BREAST LUMPECTOMY WITH RADIOACTIVE SEED AND SENTINEL LYMPH NODE BIOPSY;  Surgeon: Stark Klein, MD;  Location: [facility];  Service: General;  Laterality: Right;  . BREAST REDUCTION SURGERY Bilateral 01/06/2015   Procedure: BILATERAL BREAST REDUCTION, RIGHT ONCOPLASTIC RECONSTRUCTION),LEFT BREAST REDUCTION FOR SYMMETRY;  Surgeon: [name], MD;  Location: [place];  Service: Plastics;  Laterality: Bilateral;  . BREATH TEK H PYLORI N/A 09/15/2012   Procedure: BREATH TEK H PYLORI;  Surgeon: Pedro Earls, MD;  Location: [facility];  Service: General;  Laterality: N/A;  . BUNIONECTOMY Left 12/2013  . CARPAL TUNNEL RELEASE Right   . CARPAL TUNNEL RELEASE Left   . CATARACT EXTRACTION     6/19  . COLONOSCOPY    . DUPUYTREN CONTRACTURE RELEASE    . GASTRIC ROUX-EN-Y N/A 11/02/2012   Procedure: LAPAROSCOPIC ROUX-EN-Y GASTRIC;  Surgeon: Pedro Earls, MD;  Location: [facility];  Service: General;  Laterality: N/A;  . IR GENERIC HISTORICAL  03/18/2016   IR US GUIDE VASC ACCESS LEFT 03/18/2016 Markus Daft, MD [facility]  . IR GENERIC HISTORICAL  03/18/2016   IR FLUORO GUIDE CV LINE LEFT 03/18/2016 Markus Daft, MD [facility]  .  LAPAROSCOPIC APPENDECTOMY N/A 07/22/2015   Procedure: APPENDECTOMY LAPAROSCOPIC;  Surgeon: Michael Boston, MD;  [facility];  Service: General;  Laterality: N/A;  . MASTECTOMY Right 02/15/2015   modified  . MODIFIED MASTECTOMY Right 02/15/2015   Procedure: RIGHT MODIFIED RADICAL MASTECTOMY;  Surgeon: Stark Klein, MD;  Location: [place];  Service: General;  Laterality: Right;  . PORT-A-CATH REMOVAL Left 10/10/2015   Procedure: PORT REMOVAL;  Surgeon: Stark Klein, MD;  Location: [place];  Service: General;  Laterality: Left;  . PORTACATH PLACEMENT Left 08/02/2014   Procedure: INSERTION PORT-A-CATH;  Surgeon: Stark Klein, MD;  Location: [place];  Service: General;  Laterality: Left;  . PORTACATH PLACEMENT N/A 11/05/2016   Procedure: INSERTION PORT-A-CATH;  Surgeon: Stark Klein, MD;  Location: [place];  Service: General;  Laterality: N/A;  . ROTATOR CUFF REPAIR Right 2005  . SCAR REVISION Right 12/08/2015   Procedure: COMPLEX REPAIR RIGHT CHEST 10-15CM;  Surgeon: [name], MD;  Location: [facility];  Service: Plastics;  Laterality: Right;  . TOE SURGERY  1970s   bone spur  . TRIGGER FINGER RELEASE  x3    FAMILY HISTORY Family History  Problem Relation Age of Onset  . CVA Mother   . Heart disease Mother   . Sudden death Mother   . Diabetes Father   . Heart failure Father   . Hypertension Father   . Kidney disease Father   . Obesity Father   . Hypertension Sister   . Diabetes Brother   . Breast cancer Paternal Grandmother   . Diabetes Paternal Uncle    The patient's father died from complications of diabetes at age 71. The patient's mother died at age 25 with a subarachnoid hemorrhage. The patient had one brother, one sister. The only breast cancer was the patient's paternal grandmother diagnosed in her 44s. There is no history of ovarian cancer in the family.  GYNECOLOGIC HISTORY:  Patient's last menstrual period was 10/22/2007.  menarche  age 88, the patient is GX P0. She went through the change of life in 2011. She did not take hormone replacement.  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 Her husband Darlene [name] is a retired Visual merchandiser (he taught at [employer]).  Darlene [name] has 2 children from an earlier marriage, Rockey Situ who teaches in [place], and Laylaa Guevarra,  who works at the [employer] in in [place]. He has 1 grand son, aged 28. The patient and her husband are not church attenders.    ADVANCED DIRECTIVES:  Not in place   HEALTH MAINTENANCE: Social History   Tobacco Use  . Smoking status: Never Smoker  . Smokeless tobacco: Never Used  Substance Use Topics  . Alcohol use: Yes    Alcohol/week: 1.0 standard drinks    Types: 1 Standard drinks or equivalent per week    Comment: social  . Drug use: No     Colonoscopy:  May 2016  PAP:  Bone density: 07/01/2016 at [facility] showed normal, with a T score of -0.7  Lipid panel:  Allergies  Allergen Reactions  . Nsaids Other (See Comments)    H/o gastric bypass - avoid NSAIDs  . Tape Hives, Rash and Other (See Comments)    Adhesives in bandaids    Current Outpatient Medications  Medication Sig Dispense Refill  . acetaminophen (TYLENOL) 325 MG tablet Take 650 mg by mouth 2 (two) times daily as needed for moderate pain or headache.     [name] buPROPion (WELLBUTRIN SR) 200 MG 12 hr tablet TAKE 1 TABLET BY MOUTH DAILY AT 12 NOON. 30 tablet 0  . CALCIUM CITRATE-VITAMIN D PO Take 1 tablet by mouth 3 (three) times daily.     . cholecalciferol (VITAMIN D) 1000 units tablet Take 1,000 Units by mouth daily.    . Cyanocobalamin (VITAMIN B-12 PO) Take 1 tablet by mouth every 14 (fourteen) days.    . diphenhydrAMINE (BENADRYL) 25 MG tablet Take 25 mg by mouth daily as needed for allergies or sleep.     . Insulin Glargine (BASAGLAR KWIKPEN)  100 UNIT/ML SOPN INJECT 0.12 MLS (12 UNITS TOTAL) INTO THE SKIN DAILY. 5 pen 0  . Insulin Pen Needle (BD PEN NEEDLE NANO U/F) 32G X 4 MM MISC 1 Package by Does not apply route daily at 12 noon. 100 each 0  . liraglutide (VICTOZA) 18 MG/3ML SOPN Inject 0.3 mLs (1.8 mg total) into the skin every morning. 3 pen 0  . losartan (COZAAR) 100 MG tablet Take  1 tablet (100 mg total) by mouth daily. 30 tablet 0  . Melatonin 3 MG CAPS Take 3 mg by mouth at bedtime.     . metFORMIN (GLUCOPHAGE) 500 MG tablet Take 1 tablet (500 mg total) by mouth daily. 30 tablet 0  . ONETOUCH DELICA LANCETS 14G MISC 1 Package by Does not apply route daily. 100 each 0  . Pediatric Multivitamins-Iron (FLINTSTONES PLUS IRON) chewable tablet Chew 1 tablet by mouth 2 (two) times daily.    . Probiotic Product ([employer]S PROBIOTIC PO) Take 1 capsule by mouth daily.     [name] Propylene Glycol (SYSTANE BALANCE OP) Place 1-2 drops into both eyes 3 (three) times daily as needed (for dry eyes).     [name] senna-docusate (SENNA PLUS) 8.6-50 MG tablet Take 1 tablet by mouth daily as needed for moderate constipation.     . topiramate (TOPAMAX) 25 MG tablet Take 1 tablet (25 mg total) by mouth daily. 30 tablet 0  . vortioxetine HBr (TRINTELLIX) 20 MG TABS Take 20 mg by mouth at bedtime. 30 tablet 0   No current facility-administered medications for this visit.     OBJECTIVE:  Vitals:   10/22/17 0955  BP: (!) 126/54  Pulse: 80  Resp: 18  Temp: 98 F (36.7 C)  SpO2: 99%     Body mass index is 29.34 kg/m.    ECOG FS:1 - Symptomatic but completely ambulatory Filed Weights   10/22/17 0955  Weight: 198 lb 11.2 oz (90.1 kg)   GENERAL: Patient is a well appearing female in no acute distress HEENT:  Sclerae anicteric.  Oropharynx clear and moist. No ulcerations or evidence of oropharyngeal candidiasis. Neck is supple.  NODES:  No cervical, supraclavicular, or axillary lymphadenopathy palpated.  BREAST EXAM:  Right chest wall s/p mastectomy and  radiation, no sign of local recurrence, left breast s/p reduction, benign LUNGS:  Clear to auscultation bilaterally.  No wheezes or rhonchi. HEART:  Regular rate and rhythm. No murmur appreciated. ABDOMEN:  Soft, nontender.  Positive, normoactive bowel sounds. No organomegaly palpated. MSK:  No focal spinal tenderness to palpation. Full range of motion bilaterally in the upper extremities. EXTREMITIES:  No peripheral edema.   SKIN:  Clear with no obvious rashes or skin changes. No nail dyscrasia. NEURO:  Nonfocal. Well oriented.  Appropriate affect.     LAB RESULTS:  CMP     Component Value Date/Time   NA 143 10/14/2017 0811   NA 143 03/11/2017 0841   NA 140 01/07/2017 0758   K 4.0 10/14/2017 0811   K 4.1 01/07/2017 0758   CL 112 (H) 10/14/2017 0811   CO2 23 10/14/2017 0811   CO2 24 01/07/2017 0758   GLUCOSE 145 (H) 10/14/2017 0811   GLUCOSE 134 01/07/2017 0758   BUN 24 (H) 10/14/2017 0811   BUN 40 (H) 03/11/2017 0841   BUN 33.6 (H) 01/07/2017 0758   CREATININE 1.49 (H) 10/14/2017 0811   CREATININE 1.5 (H) 01/07/2017 0758   CALCIUM 9.4 10/14/2017 0811   CALCIUM 9.3 01/07/2017 0758   PROT 6.7 10/14/2017 0811   PROT 6.9 03/11/2017 0841   PROT 6.7 01/07/2017 0758   ALBUMIN 3.7 10/14/2017 0811   ALBUMIN 4.5 03/11/2017 0841   ALBUMIN 3.6 01/07/2017 0758   AST 61 (H) 10/14/2017 0811   AST 51 (H) 01/07/2017 0758   ALT 70 (H) 10/14/2017 0811   ALT 82 (H) 01/07/2017 0758   ALKPHOS 135 (H) 10/14/2017 0811   ALKPHOS 115 01/07/2017 0758  BILITOT 0.8 10/14/2017 0811   BILITOT 0.72 01/07/2017 0758   GFRNONAA 36 (L) 10/14/2017 0811   GFRNONAA 55 (L) 02/18/2013 0827   GFRAA 41 (L) 10/14/2017 0811   GFRAA 63 02/18/2013 0827    INo results found for: SPEP, UPEP  Lab Results  Component Value Date   WBC 3.4 (L) 10/14/2017   NEUTROABS 2.1 10/14/2017   HGB 10.5 (L) 10/14/2017   HCT 31.2 (L) 10/14/2017   MCV 92.9 10/14/2017   PLT 60 (L) 10/14/2017      Chemistry        Component Value Date/Time   NA 143 10/14/2017 0811   NA 143 03/11/2017 0841   NA 140 01/07/2017 0758   K 4.0 10/14/2017 0811   K 4.1 01/07/2017 0758   CL 112 (H) 10/14/2017 0811   CO2 23 10/14/2017 0811   CO2 24 01/07/2017 0758   BUN 24 (H) 10/14/2017 0811   BUN 40 (H) 03/11/2017 0841   BUN 33.6 (H) 01/07/2017 0758   CREATININE 1.49 (H) 10/14/2017 0811   CREATININE 1.5 (H) 01/07/2017 0758      Component Value Date/Time   CALCIUM 9.4 10/14/2017 0811   CALCIUM 9.3 01/07/2017 0758   ALKPHOS 135 (H) 10/14/2017 0811   ALKPHOS 115 01/07/2017 0758   AST 61 (H) 10/14/2017 0811   AST 51 (H) 01/07/2017 0758   ALT 70 (H) 10/14/2017 0811   ALT 82 (H) 01/07/2017 0758   BILITOT 0.8 10/14/2017 0811   BILITOT 0.72 01/07/2017 0758       No results found for: LABCA2  No components found for: NIOEV035  No results for input(s): INR in the last 168 hours.  Urinalysis    Component Value Date/Time   COLORURINE YELLOW 07/21/2015 [place] 07/21/2015 2328   LABSPEC 1.020 07/21/2015 2328   PHURINE 6.0 07/21/2015 2328   GLUCOSEU NEGATIVE 07/21/2015 2328   HGBUR TRACE (A) 07/21/2015 [place] 07/21/2015 2328   BILIRUBINUR neg 06/29/2013 [place] 07/21/2015 2328   PROTEINUR 30 (A) 07/21/2015 2328   UROBILINOGEN negative 06/29/2013 1017   NITRITE NEGATIVE 07/21/2015 2328   LEUKOCYTESUR NEGATIVE 07/21/2015 2328    STUDIES: Dg Ankle Complete Left  Result Date: 09/25/2017 Please see detailed radiograph report in office note.  Mr Liver W Wo Contrast  Result Date: 10/21/2017 CLINICAL DATA:  Followup liver lesion. History of breast cancer with initial diagnosis 2015. EXAM: MRI ABDOMEN WITHOUT AND WITH CONTRAST TECHNIQUE: Multiplanar multisequence MR imaging of the abdomen was performed both before and after the administration of intravenous contrast. CONTRAST:  74m MULTIHANCE GADOBENATE DIMEGLUMINE 529 MG/ML IV SOLN COMPARISON:  MRI  08/02/2017 and PET-CT 07/10/2017. FINDINGS: Lower chest: The lung bases are clear of an acute process. No worrisome pulmonary lesions or pleural effusion. No pericardial effusion. Hepatobiliary: Again demonstrated is a peripheral segment 8 liver lesion which is diffusion positive and demonstrates rim like contrast enhancement. This has enlarged slightly since the prior MRI. On that study it measured a maximum of 14 mm and now measures a maximum of 18 mm. No new liver lesions are identified. No intra or extrahepatic biliary dilatation. Small gallstones are noted the gallbladder. Pancreas:  No mass, inflammation or ductal dilatation. Spleen: Normal size. Stable small splenic lesion, likely benign cyst. Adrenals/Urinary Tract: The adrenal glands and kidneys are unremarkable. Stable thickened lateral limb of the left adrenal gland. Stomach/Bowel: Visualized portions within the abdomen are unremarkable. Vascular/Lymphatic: The aorta and branch vessels  are patent. The major venous structures are patent. Slight interval enlargement of celiac axis and hepatoduodenal ligament lymph nodes. There is a 15 mm short axis celiac axis lymph node (series 11, image 43). This previously measured 11 mm. 12.5 mm hepatoduodenal ligament lymph node on series 11, image 45 previously measured 9 mm. No new abdominal lymphadenopathy. Other:  No ascites or abdominal wall hernia. Musculoskeletal: No significant bony findings. IMPRESSION: 1. Slight interval enlargement of the segment 8 liver lesion but no new hepatic lesions. 2. Slight interval enlargement of upper abdominal lymph nodes. 3. Cholelithiasis. Electronically Signed   By: Marijo Sanes M.D.   On: 10/21/2017 16:23   Mr Ankle Left Wo Contrast  Result Date: 10/21/2017 CLINICAL DATA:  Painful knot on the anterior aspect of the left ankle for 1 year. EXAM: MRI OF THE LEFT ANKLE WITHOUT CONTRAST TECHNIQUE: Multiplanar, multisequence MR imaging of the ankle was performed. No intravenous  contrast was administered. COMPARISON:  None. FINDINGS: TENDONS Peroneal: Intact. Posteromedial: Intact. Anterior: There is focal thickening of the tibialis anterior at and just superior to the tibiotalar joint. The tendon measures 1.8 cm transverse by 1.1 cm AP by 2.4 cm craniocaudal at the site of the lesion. Signal intensity within the tendon in this location is heterogeneous with areas of mild T2 hyperintensity present. The tendon is intact. Achilles: Intact. Plantar Fascia: Normal. LIGAMENTS Lateral: Intact. Medial: Intact. CARTILAGE Ankle Joint: Normal. Subtalar Joints/Sinus Tarsi: Normal. Bones: The patient has an os trigonum measuring 0.9 cm AP by 0.6 cm craniocaudal by approximately 1.5 cm transverse. There is extensive cystic change and mild edema about the synchondrosis. Small vascular remnant in the calcaneus is noted. Other: None. IMPRESSION: Focal thickening of the tibialis anterior tendon as described above has an appearance most suggestive of chronic interstitial tear, tendinosis and scar. Fibroma within the tendon is also possible. Although the location is atypical, xanthoma is also within the differential. The lesion should be easily amenable to biopsy. Electronically Signed   By: Inge Rise M.D.   On: 10/21/2017 15:19     ASSESSMENT: 67 y.o. [place] woman status post right breast upper outer quadrant and right axillary lymph node biopsy 07/19/2014 for a cT2  pN1, stage IIB  invasive ductal carcinoma, grade 2, estrogen and progesterone receptor negative, with an MIB-1 of 90%, and HER-2 amplified  (1) neoadjuvant chemotherapy started 08/08/2014, consisting of carboplatin, docetaxel, trastuzumab and pertuzumab given every 3 weeks 6, completed 11/21/2014  (a) breast MRI after initial 3 cycles shows a significant response  (b) breast MRI after 6 cycles shows a complete radiologic response   (2) trastuzumab continued through 10/02/2015 (to end of capecitabine therapy)  (a) echo  02/13/2015 shows an EF of 60-65%  (b)  echo 05/09/2015 shows an ejection fraction of 60-65%  (c) echocardiogram 06/27/2015 shows an ejection fraction of 55-60%.  (d) echocardiogram 01/22/2017 shows an ejection fraction of 55-60%  (3) right lumpectomy with sentinel lymph node biopsy on 12/27/14 showed a residual  ypT2 ypN1a, stage IIB invasive ductal carcinoma, grade 2, with repeat estrogen and progesterone receptors again negative  (4) status post right oncoplastic surgery (with left reduction mammoplasty) 01/06/2015 showing in the right breast multiple foci of intralymphatic carcinoma in random sections  (5)  Right modified radical mastectomy 02/15/2014 showed no residual invasive disease; there was DCIS but margins were negative; all 15 axillary lymph nodes were clear; ypTis ypN0  (a)  The patient has decided against reconstruction.  (6)  Postmastectomy radiation completed 05/18/2015  with capecitabine sensitization  (7) adjuvant capecitabine continued through 09/27/2015  (8) hepatic steatosis with increased fibrosis score (at risk for cirrhosis)  (9) thyroid biopsy (left lobe inferiorly) 2 shows benign tissue 06/28/2015  (10) LOCAL RECURRENCE:  (a) PET scan 12/06/2015 Notes a right retropectoral lymph node measuring 0.9 cm with an SUV max of 5.5  (b) repeat PET scan 03/11/2016 finds the right subpectoral lymph node to now measure 1.3 cm with an SUV max of 11.8.  (c) PET scan 05/07/2016 shows a significant response to T-DM 1  (d) PET scan 07/10/2017 is clear except for what appears to be a single new liver lesion   (11) started T-DM1 03/21/2016, completed 8 doses 08/13/2016  (a) PET scan 08/19/2016 shows a good response but measurable residual disease  (b) PET scan on 01/16/2017 shows a continuing response  (12) maintenance trastuzumab started 09/03/2016--changed to every 28 days as of 01/28/2017  (a) repeat echocardiogram 07/08/2016 shows an ejection fraction of 60-65%.  (b)  echocardiogram on 10/08/2016 demonstrates LVEF of 55-60% (followed by Dr. Haroldine Laws)  (c) echo 01/22/2017 shows an ejection fraction in the 55-60% range  (d) echo 04/24/2017 shows an ejection fraction in the 55-60% range (recommendation for echo every 6 months by Dr. Aundra Dubin)  (e) trastuzumab changed to T-DM 1 after trastuzumab dose 07/15/2017  METASTATIC DISEASE: July 2019 (13)  Radiation to the area of persistent disease in the right upper chest completed 10/30/2016   (14) T-DM 1 resumed 08/12/2017, repeated every 21 days  (a) baseline liver MRI 08/02/2017 shows a 1.4 cm mass, "hot" on PET 07/10/2017  (b) repeat liver MRI 10/22/2018 3:19 cycles of T-DM 1, shows slight enlargement  PLAN: Darlene [name] met with Dr. Jana Hakim and myself.  He reviewed the scans with her which show that the liver lesion and two lymph nodes in the abdomen are slightly larger.  He would like to get a biopsy of these areas, however this has been attempted previously and was unsuccessful.  Dr. Jana Hakim plans to review with IR personally, and then will decide on a plan.    Darlene [name] will return in 2 weeks for labs and f/u.  We hope to have this procedure done if possible by that time.  She and her husband are in agreement with the plan. I have asked her to let us know if any problems develop before the next visit.   Wilber Bihari, NP  10/22/17 10:02 AM Medical Oncology and H[facility] [address] [place], [place] [address] Tel. [phone]    Fax. [phone]   ADDENDUM: I met with Darlene [name] and discussed results of the scans which unfortunately despite her receiving 3 doses of T-DM1 shows slight worsening of the liver spot.  We have tried to biopsy this in the past but the radiologist at that particular occasion felt it was beyond his technical capability.  I have called to Dr. Daiva Huge.  He has reviewed the films.  He agrees this would be a challenging biopsy but he feels it may be possible and  accordingly I have entered the order referring the case specifically to him.  I have called Darlene [name] to let her know that that is the plan.  Of course if the liver tumor is HER-2 negative we will have to change her treatment.  We can always consider ablation of the liver lesion if necessary.  In any case we will have the information we need to determine what is the next best step in her  treatment.  She is very aware of all this and very much in agreement with the plan.  She will see me again in about 2 weeks.  She knows to call for any other issues that may develop before then.   I personally saw this patient and performed a substantive portion of this encounter with the listed APP documented above.   Chauncey Cruel, MD Medical Oncology and Hematology [facility] [address] [place], [place] [address] Tel. [phone]    Fax. [phone]

## 2017-10-23 ENCOUNTER — Telehealth: Payer: Self-pay | Admitting: Interventional Radiology

## 2017-10-23 ENCOUNTER — Ambulatory Visit (HOSPITAL_BASED_OUTPATIENT_CLINIC_OR_DEPARTMENT_OTHER)
Admission: RE | Admit: 2017-10-23 | Discharge: 2017-10-23 | Disposition: A | Discharge status: Home / Self Care | Payer: Medicare Other | Source: Ambulatory Visit | Attending: Cardiology | Admitting: Cardiology

## 2017-10-23 ENCOUNTER — Ambulatory Visit (HOSPITAL_COMMUNITY)
Admission: RE | Admit: 2017-10-23 | Discharge: 2017-10-23 | Disposition: A | Discharge status: Home / Self Care | Payer: Medicare Other | Source: Ambulatory Visit | Attending: Family Medicine | Admitting: Family Medicine

## 2017-10-23 ENCOUNTER — Other Ambulatory Visit: Payer: Self-pay | Admitting: Oncology

## 2017-10-23 VITALS — BP 142/80 | HR 81 | Wt 200.4 lb

## 2017-10-23 DIAGNOSIS — C50411 Malignant neoplasm of upper-outer quadrant of right female breast: Secondary | ICD-10-CM | POA: Insufficient documentation

## 2017-10-23 DIAGNOSIS — Z8249 Family history of ischemic heart disease and other diseases of the circulatory system: Secondary | ICD-10-CM | POA: Insufficient documentation

## 2017-10-23 DIAGNOSIS — F329 Major depressive disorder, single episode, unspecified: Secondary | ICD-10-CM | POA: Insufficient documentation

## 2017-10-23 DIAGNOSIS — Z794 Long term (current) use of insulin: Secondary | ICD-10-CM | POA: Diagnosis not present

## 2017-10-23 DIAGNOSIS — Z888 Allergy status to other drugs, medicaments and biological substances status: Secondary | ICD-10-CM | POA: Diagnosis not present

## 2017-10-23 DIAGNOSIS — E1140 Type 2 diabetes mellitus with diabetic neuropathy, unspecified: Secondary | ICD-10-CM | POA: Diagnosis not present

## 2017-10-23 DIAGNOSIS — Z886 Allergy status to analgesic agent status: Secondary | ICD-10-CM | POA: Insufficient documentation

## 2017-10-23 DIAGNOSIS — I10 Essential (primary) hypertension: Secondary | ICD-10-CM | POA: Diagnosis not present

## 2017-10-23 DIAGNOSIS — Z8349 Family history of other endocrine, nutritional and metabolic diseases: Secondary | ICD-10-CM | POA: Diagnosis not present

## 2017-10-23 DIAGNOSIS — Z923 Personal history of irradiation: Secondary | ICD-10-CM | POA: Insufficient documentation

## 2017-10-23 DIAGNOSIS — Z79899 Other long term (current) drug therapy: Secondary | ICD-10-CM | POA: Diagnosis not present

## 2017-10-23 DIAGNOSIS — I351 Nonrheumatic aortic (valve) insufficiency: Secondary | ICD-10-CM | POA: Insufficient documentation

## 2017-10-23 DIAGNOSIS — F419 Anxiety disorder, unspecified: Secondary | ICD-10-CM | POA: Insufficient documentation

## 2017-10-23 DIAGNOSIS — I700 Atherosclerosis of aorta: Secondary | ICD-10-CM | POA: Insufficient documentation

## 2017-10-23 DIAGNOSIS — Z171 Estrogen receptor negative status [ER-]: Secondary | ICD-10-CM | POA: Insufficient documentation

## 2017-10-23 DIAGNOSIS — Z9011 Acquired absence of right breast and nipple: Secondary | ICD-10-CM | POA: Insufficient documentation

## 2017-10-23 DIAGNOSIS — Z833 Family history of diabetes mellitus: Secondary | ICD-10-CM | POA: Diagnosis not present

## 2017-10-23 DIAGNOSIS — Z803 Family history of malignant neoplasm of breast: Secondary | ICD-10-CM | POA: Diagnosis not present

## 2017-10-23 DIAGNOSIS — G4733 Obstructive sleep apnea (adult) (pediatric): Secondary | ICD-10-CM | POA: Insufficient documentation

## 2017-10-23 NOTE — Telephone Encounter (Signed)
Darlene [name] called to discuss possible biopsy. H/o breast CA Solitary liver lesion ENlarging on recent MRI.  Needs core biopsy. WOuld start with [name] may be able to localize with angulation. IF not, the lesion IS visible on non-contrast CT so would try CT guided core biopsy. OK for [name] core biopsy liver lesion, with moderate sedation.  [facility] or [facility].  DDH

## 2017-10-23 NOTE — Patient Instructions (Addendum)
Medication Instructions:  Your physician recommends that you continue on your current medications as directed. Please refer to the Current Medication list given to you today.   Labwork: None ordered  Testing/Procedures: Your physician has requested that you have an echocardiogram in 6 months with appointment. Echocardiography is a painless test that uses sound waves to create images of your heart. It provides your doctor with information about the size and shape of your heart and how well your heart's chambers and valves are working. This procedure takes approximately one hour. There are no restrictions for this procedure.   Follow-Up: Your physician recommends that you schedule a follow-up appointment in: 6 months with Dr. Aundra Dubin. Please call the office in March to schedule appointment.   Any Other Special Instructions Will Be Listed Below (If Applicable).     If you need a refill on your cardiac medications before your next appointment, please call your pharmacy.

## 2017-10-23 NOTE — Progress Notes (Signed)
  Echocardiogram 2D Echocardiogram has been performed.  Darlene [name] 10/23/2017, 9:44 AM

## 2017-10-23 NOTE — Progress Notes (Signed)
Patient ID: Darlene [name], female   DOB: [birth year], 67 y.o.   MRN: [id number] Oncologist: Dr Jana Hakim   HPI: Darlene [name] is a 67 y.o.with history of R breast cancer, right axillary lymph node biopsy 07/19/2014 for a cT2 pN1, stage IIB invasive ductal carcinoma, grade 2, estrogen and progesterone receptor negative, with an MIB-1 of 90%, and HER-2 positive , OSA -> CPAP, bariatric surgery, and DM, referred to cardio-oncology clinic by Dr Jana Hakim  Completed neoadjuvant chemotherapy with carboplatin, docetaxel, trastuzumab and pertuzumab given every 3 weeks 6. Trastuzumab + capecitabine were completed in 9/17.  She had lumpectomy 12/16 and had mastectomy in 1/17.  She has completed radiation.   She was found in 11/17 to have a recurrence by PET => right retropectoral lymph node enlarged. In 2/18, she started on treatment with ado-trastuzumab emtansine, now back on Herceptin.  Echo prior to treatment showed stable EF.  Starting in 6/19, she was transitioned to [facility].   She is now off Kadcyla, waiting to hear from Dr. Jana Hakim about future treatment.  No dyspnea or chest pain.     Echo (7/16) with EF 60-65%, grade II diastolic dysfunction.  Echo (10/16) with EF 66%, GLS -17%. Echo (1/17) with EF 60-65%, grade II diastolic dysfunction, lateral s' 10, GLS -18.8% Echo (4/17) with EF 60-65%, grade II diastolic dysfunction, lateral s' 9.5, strain poorly traced and off axis, normal RV size and systolic function.  Echo (6/17) with EF 55-60%, aortic sclerosis without stenosis, difficult images for strain, probably not accurate.  Echo (10/17) with EF 55-60%, mild LVH, moderate diastolic dysfunction, normal RV size and systolic function.  Echo (2/18) with EF 55-60%, GLS -15% but technically difficult study Echo (6/18) with EF 60-65%, GLS -15% but technically difficult study.  Echo (9/18) with EF 55-60%, GLS -19.8%, grade II diastolic dysfunction.  Echo (1/19) with EF 55-60%, normal RV size and  systolic function, GLS -84.6% (done with different machine).  Echo (4/19) with EF 55-60%, mild LVH, GLS -17% Echo (9/19) with EF 55-60%, GLS -17.1%, normal RV size and systolic function.   Review of Systems: All systems reviewed and negative except as per HPI.   Past Medical History:  Diagnosis Date  . Anemia    hx of - during 1st round chemo  . Anxiety   . Arthritis    in fingers, shoulders  . Back pain   . Balance problem   . Breast cancer ([place])   . Breast cancer of upper-outer quadrant of right female breast ([place]) 07/22/2014  . Bunion, right foot   . Clumsiness   . Constipation   . Depression   . Diabetes mellitus without complication ([place])    96+ years  . Eating disorder    binge eating  . Epistaxis 08/26/2014  . Fatty liver   . HCAP (healthcare-associated pneumonia) 12/18/2014  . Headache   . Heart murmur    never had any problems  . History of radiation therapy 04/05/15-05/19/15   right chest wall was treated to 50.4 Gy in 28 fractions, right mastectomy scar was treated to 10 Gy in 5 fractions  . Hyperlipidemia   . Hypertension   . Joint pain   . Multiple food allergies   . Neuromuscular disorder ([place])    diabetic neuropathy  . Obesity   . Obstructive sleep apnea on CPAP    uses cpap nightly  . Ovarian cyst rupture    possible  . Pneumonia   . Pneumonia 11/2014  . Radiation 04/05/15-05/19/15  right chest wall 50.4 Gy, mastectomy scar 10 Gy  . Sleep apnea   . Thyroid nodule 06/2014    Current Outpatient Medications  Medication Sig Dispense Refill  . acetaminophen (TYLENOL) 325 MG tablet Take 650 mg by mouth 2 (two) times daily as needed for moderate pain or headache.     [name] buPROPion (WELLBUTRIN SR) 200 MG 12 hr tablet TAKE 1 TABLET BY MOUTH DAILY AT 12 NOON. 30 tablet 0  . CALCIUM CITRATE-VITAMIN D PO Take 1 tablet by mouth 3 (three) times daily.     . cholecalciferol (VITAMIN D) 1000 units tablet Take 1,000 Units by mouth daily.    . Cyanocobalamin (VITAMIN  B-12 PO) Take 1 tablet by mouth every 14 (fourteen) days.    . diphenhydrAMINE (BENADRYL) 25 MG tablet Take 25 mg by mouth daily as needed for allergies or sleep.     . Insulin Glargine (BASAGLAR KWIKPEN) 100 UNIT/ML SOPN INJECT 0.12 MLS (12 UNITS TOTAL) INTO THE SKIN DAILY. 5 pen 0  . Insulin Pen Needle (BD PEN NEEDLE NANO U/F) 32G X 4 MM MISC 1 Package by Does not apply route daily at 12 noon. 100 each 0  . liraglutide (VICTOZA) 18 MG/3ML SOPN Inject 0.3 mLs (1.8 mg total) into the skin every morning. 3 pen 0  . losartan (COZAAR) 100 MG tablet Take 1 tablet (100 mg total) by mouth daily. 30 tablet 0  . Melatonin 3 MG CAPS Take 3 mg by mouth at bedtime.     . metFORMIN (GLUCOPHAGE) 500 MG tablet Take 1 tablet (500 mg total) by mouth daily. 30 tablet 0  . ONETOUCH DELICA LANCETS 24Q MISC 1 Package by Does not apply route daily. 100 each 0  . Pediatric Multivitamins-Iron (FLINTSTONES PLUS IRON) chewable tablet Chew 1 tablet by mouth 2 (two) times daily.    . Probiotic Product ([employer] PROBIOTIC PO) Take 1 capsule by mouth daily.     [name] Propylene Glycol (SYSTANE BALANCE OP) Place 1-2 drops into both eyes 3 (three) times daily as needed (for dry eyes).     [name] senna-docusate (SENNA PLUS) 8.6-50 MG tablet Take 1 tablet by mouth daily as needed for moderate constipation.     . topiramate (TOPAMAX) 25 MG tablet Take 1 tablet (25 mg total) by mouth daily. 30 tablet 0  . vortioxetine HBr (TRINTELLIX) 20 MG TABS Take 20 mg by mouth at bedtime. 30 tablet 0   No current facility-administered medications for this encounter.     Allergies  Allergen Reactions  . Nsaids Other (See Comments)    H/o gastric bypass - avoid NSAIDs  . Tape Hives, Rash and Other (See Comments)    Adhesives in bandaids      Social History   Socioeconomic History  . Marital status: Married    Spouse name: [name]  . Number of children: 0  . Years of education: Bachelor's  . Highest education level: Not on file  Occupational  History  . Occupation: Pensions consultant: [employer]  . Financial resource strain: Not on file  . Food insecurity:    Worry: Not on file    Inability: Not on file  . Transportation needs:    Medical: Not on file    Non-medical: Not on file  Tobacco Use  . Smoking status: Never Smoker  . Smokeless tobacco: Never Used  Substance and Sexual Activity  . Alcohol use: Yes    Alcohol/week: 1.0 standard drinks  Types: 1 Standard drinks or equivalent per week    Comment: social  . Drug use: No  . Sexual activity: Yes    Partners: Male    Birth control/protection: Post-menopausal, Other-see comments    Comment: husband vasectomy  Lifestyle  . Physical activity:    Days per week: Not on file    Minutes per session: Not on file  . Stress: Not on file  Relationships  . Social connections:    Talks on phone: Not on file    Gets together: Not on file    Attends religious service: Not on file    Active member of club or organization: Not on file    Attends meetings of clubs or organizations: Not on file    Relationship status: Not on file  . Intimate partner violence:    Fear of current or ex partner: Not on file    Emotionally abused: Not on file    Physically abused: Not on file    Forced sexual activity: Not on file  Other Topics Concern  . Not on file  Social History Narrative   Patient is married [name]) and lives at home with her husband.   Patient does not have any children.   Patient is working for a [employer].   Patient has a Bachelor's degree.   Patient is right-handed.   Patient does not drink any caffeine.      Family History  Problem Relation Age of Onset  . CVA Mother   . Heart disease Mother   . Sudden death Mother   . Diabetes Father   . Heart failure Father   . Hypertension Father   . Kidney disease Father   . Obesity Father   . Hypertension Sister   . Diabetes Brother   . Breast cancer Paternal  Grandmother   . Diabetes Paternal Uncle     Vitals:   10/23/17 0944  BP: (!) 142/80  Pulse: 81  SpO2: 98%  Weight: 90.9 kg (200 lb 6.4 oz)    PHYSICAL EXAM: General: NAD Neck: No JVD, no thyromegaly or thyroid nodule.  Lungs: Clear to auscultation bilaterally with normal respiratory effort. CV: Nondisplaced PMI.  Heart regular S1/S2, no S3/S4, 1/6 SEM RUSB.  No peripheral edema.  No carotid bruit.  Normal pedal pulses.  Abdomen: Soft, nontender, no hepatosplenomegaly, no distention.  Skin: Intact without lesions or rashes.  Neurologic: Alert and oriented x 3.  Psych: Normal affect. Extremities: No clubbing or cyanosis.  HEENT: Normal.   ASSESSMENT & PLAN:  1. Breast cancer: Metastatic.  Most recently on Kadcyla, this has now been stopped.  Waiting to hear about future treatment from Dr. Jana Hakim. Echo was done today and reviewed, EF and strain stable.  - I will arrange for echo in 6 months if she is restarted on anti-HER2 treatment.  If not, she does not need another echo.  2. HTN: BP controlled on losartan for now.  3. OSA: Continue nightly CPAP   Loralie Champagne 10/23/2017

## 2017-10-24 NOTE — Addendum Note (Signed)
Addended by: [name] Mon R on: 10/24/2017 04:11 PM   Modules accepted: Orders

## 2017-10-25 ENCOUNTER — Other Ambulatory Visit (INDEPENDENT_AMBULATORY_CARE_PROVIDER_SITE_OTHER): Payer: Self-pay | Admitting: Family Medicine

## 2017-10-25 DIAGNOSIS — E119 Type 2 diabetes mellitus without complications: Secondary | ICD-10-CM

## 2017-10-25 DIAGNOSIS — Z794 Long term (current) use of insulin: Secondary | ICD-10-CM

## 2017-10-27 NOTE — Telephone Encounter (Signed)
The Physical Therapy request was sent on 10/24/17. Lattie Haw

## 2017-10-28 ENCOUNTER — Other Ambulatory Visit: Payer: Self-pay | Admitting: Oncology

## 2017-10-30 ENCOUNTER — Other Ambulatory Visit: Payer: Self-pay | Admitting: Student

## 2017-10-30 ENCOUNTER — Other Ambulatory Visit: Payer: Self-pay | Admitting: Cardiology

## 2017-10-31 ENCOUNTER — Other Ambulatory Visit: Payer: Self-pay | Admitting: Physician Assistant

## 2017-10-31 ENCOUNTER — Telehealth: Payer: Self-pay | Admitting: Student

## 2017-10-31 NOTE — Telephone Encounter (Signed)
PA informed patient called with questions about medications to take Monday morning prior to procedure.  Reviewed med list.  Called and spoke with patient.  She will hold all diabetes medications on Monday.  Ok to take her BP medication that morning. She understands to present to short stay early Monday morning NPO.  All questions answered.   Brynda Greathouse, MS RD PA-C 10:08 AM

## 2017-11-03 ENCOUNTER — Ambulatory Visit (HOSPITAL_COMMUNITY)
Admission: RE | Admit: 2017-11-03 | Discharge: 2017-11-03 | Disposition: A | Discharge status: Home / Self Care | Payer: Medicare Other | Source: Ambulatory Visit | Attending: Oncology | Admitting: Oncology

## 2017-11-03 ENCOUNTER — Other Ambulatory Visit: Payer: Self-pay | Admitting: Oncology

## 2017-11-03 ENCOUNTER — Other Ambulatory Visit: Payer: Self-pay

## 2017-11-03 ENCOUNTER — Encounter (HOSPITAL_COMMUNITY): Payer: Self-pay

## 2017-11-03 DIAGNOSIS — Z823 Family history of stroke: Secondary | ICD-10-CM | POA: Insufficient documentation

## 2017-11-03 DIAGNOSIS — Z8249 Family history of ischemic heart disease and other diseases of the circulatory system: Secondary | ICD-10-CM | POA: Diagnosis not present

## 2017-11-03 DIAGNOSIS — Z171 Estrogen receptor negative status [ER-]: Secondary | ICD-10-CM | POA: Diagnosis not present

## 2017-11-03 DIAGNOSIS — Z794 Long term (current) use of insulin: Secondary | ICD-10-CM | POA: Diagnosis not present

## 2017-11-03 DIAGNOSIS — Z9221 Personal history of antineoplastic chemotherapy: Secondary | ICD-10-CM | POA: Insufficient documentation

## 2017-11-03 DIAGNOSIS — C787 Secondary malignant neoplasm of liver and intrahepatic bile duct: Secondary | ICD-10-CM | POA: Insufficient documentation

## 2017-11-03 DIAGNOSIS — F329 Major depressive disorder, single episode, unspecified: Secondary | ICD-10-CM | POA: Insufficient documentation

## 2017-11-03 DIAGNOSIS — C50411 Malignant neoplasm of upper-outer quadrant of right female breast: Secondary | ICD-10-CM

## 2017-11-03 DIAGNOSIS — Z841 Family history of disorders of kidney and ureter: Secondary | ICD-10-CM | POA: Insufficient documentation

## 2017-11-03 DIAGNOSIS — Z803 Family history of malignant neoplasm of breast: Secondary | ICD-10-CM | POA: Insufficient documentation

## 2017-11-03 DIAGNOSIS — Z886 Allergy status to analgesic agent status: Secondary | ICD-10-CM | POA: Insufficient documentation

## 2017-11-03 DIAGNOSIS — M25572 Pain in left ankle and joints of left foot: Secondary | ICD-10-CM | POA: Diagnosis not present

## 2017-11-03 DIAGNOSIS — Z853 Personal history of malignant neoplasm of breast: Secondary | ICD-10-CM | POA: Insufficient documentation

## 2017-11-03 DIAGNOSIS — Z79899 Other long term (current) drug therapy: Secondary | ICD-10-CM | POA: Diagnosis not present

## 2017-11-03 DIAGNOSIS — E785 Hyperlipidemia, unspecified: Secondary | ICD-10-CM | POA: Insufficient documentation

## 2017-11-03 DIAGNOSIS — K8020 Calculus of gallbladder without cholecystitis without obstruction: Secondary | ICD-10-CM | POA: Insufficient documentation

## 2017-11-03 DIAGNOSIS — G4733 Obstructive sleep apnea (adult) (pediatric): Secondary | ICD-10-CM | POA: Insufficient documentation

## 2017-11-03 DIAGNOSIS — Z888 Allergy status to other drugs, medicaments and biological substances status: Secondary | ICD-10-CM | POA: Diagnosis not present

## 2017-11-03 DIAGNOSIS — M1990 Unspecified osteoarthritis, unspecified site: Secondary | ICD-10-CM | POA: Insufficient documentation

## 2017-11-03 DIAGNOSIS — Z923 Personal history of irradiation: Secondary | ICD-10-CM | POA: Diagnosis not present

## 2017-11-03 DIAGNOSIS — I10 Essential (primary) hypertension: Secondary | ICD-10-CM | POA: Diagnosis not present

## 2017-11-03 DIAGNOSIS — F419 Anxiety disorder, unspecified: Secondary | ICD-10-CM | POA: Diagnosis not present

## 2017-11-03 DIAGNOSIS — Z9011 Acquired absence of right breast and nipple: Secondary | ICD-10-CM | POA: Insufficient documentation

## 2017-11-03 DIAGNOSIS — R590 Localized enlarged lymph nodes: Secondary | ICD-10-CM | POA: Diagnosis not present

## 2017-11-03 DIAGNOSIS — E1140 Type 2 diabetes mellitus with diabetic neuropathy, unspecified: Secondary | ICD-10-CM | POA: Diagnosis not present

## 2017-11-03 DIAGNOSIS — K769 Liver disease, unspecified: Secondary | ICD-10-CM | POA: Diagnosis present

## 2017-11-03 DIAGNOSIS — Z9989 Dependence on other enabling machines and devices: Secondary | ICD-10-CM | POA: Diagnosis not present

## 2017-11-03 DIAGNOSIS — E669 Obesity, unspecified: Secondary | ICD-10-CM | POA: Insufficient documentation

## 2017-11-03 DIAGNOSIS — Z833 Family history of diabetes mellitus: Secondary | ICD-10-CM | POA: Insufficient documentation

## 2017-11-03 LAB — PROTIME-INR
INR: 1.13
Prothrombin Time: 14.4 s (ref 11.4–15.2)

## 2017-11-03 LAB — APTT: aPTT: 37 seconds — ABNORMAL HIGH (ref 24–36)

## 2017-11-03 LAB — GLUCOSE, CAPILLARY: Glucose-Capillary: 97 mg/dL (ref 70–99)

## 2017-11-03 LAB — CBC
HCT: 32.6 % — ABNORMAL LOW (ref 36.0–46.0)
Hemoglobin: 10.6 g/dL — ABNORMAL LOW (ref 12.0–15.0)
MCH: 30.6 pg (ref 26.0–34.0)
MCHC: 32.5 g/dL (ref 30.0–36.0)
MCV: 94.2 fL (ref 80.0–100.0)
Platelets: 77 10*3/uL — ABNORMAL LOW (ref 150–400)
RBC: 3.46 MIL/uL — ABNORMAL LOW (ref 3.87–5.11)
RDW: 13.4 % (ref 11.5–15.5)
WBC: 3.9 10*3/uL — ABNORMAL LOW (ref 4.0–10.5)
nRBC: 0 % (ref 0.0–0.2)

## 2017-11-03 MED ORDER — MIDAZOLAM HCL 2 MG/2ML IJ SOLN
INTRAMUSCULAR | Status: AC | PRN
  Administered 2017-11-03: 1 mg via INTRAVENOUS
  Administered 2017-11-03: 0.5 mg via INTRAVENOUS
  Administered 2017-11-03: 1 mg via INTRAVENOUS

## 2017-11-03 MED ORDER — MIDAZOLAM HCL 2 MG/2ML IJ SOLN
INTRAMUSCULAR | Status: AC
  Filled 2017-11-03: qty 2

## 2017-11-03 MED ORDER — GELATIN ABSORBABLE 12-7 MM EX MISC
CUTANEOUS | Status: AC
  Filled 2017-11-03: qty 1

## 2017-11-03 MED ORDER — HYDROCODONE-ACETAMINOPHEN 5-325 MG PO TABS
1.0000 | ORAL_TABLET | ORAL | Status: DC | PRN
  Filled 2017-11-03: qty 2

## 2017-11-03 MED ORDER — SODIUM CHLORIDE 0.9 % IV SOLN: INTRAVENOUS | Status: DC

## 2017-11-03 MED ORDER — LIDOCAINE HCL (PF) 1 % IJ SOLN
INTRAMUSCULAR | Status: AC
  Filled 2017-11-03: qty 30

## 2017-11-03 MED ORDER — FENTANYL CITRATE (PF) 100 MCG/2ML IJ SOLN
INTRAMUSCULAR | Status: AC
  Filled 2017-11-03: qty 2

## 2017-11-03 MED ORDER — FENTANYL CITRATE (PF) 100 MCG/2ML IJ SOLN
INTRAMUSCULAR | Status: AC | PRN
  Administered 2017-11-03 (×3): 25 ug via INTRAVENOUS

## 2017-11-03 NOTE — Sedation Documentation (Signed)
Patient in CT at this time.

## 2017-11-03 NOTE — Procedures (Signed)
  Procedure: CT core R liver lesion 18g x3 EBL:   minimal Complications:  none immediate  See full dictation in [facility].  Dillard Cannon MD Main # [phone] Pager  [phone]

## 2017-11-03 NOTE — Sedation Documentation (Signed)
Patient will need to go to CT for biopsy.

## 2017-11-03 NOTE — H&P (Signed)
Chief Complaint: Liver lesion  Referring Physician(s): Chauncey Cruel  Supervising Physician: Arne Cleveland  Patient Status: [facility] - Out-pt  History of Present Illness: Darlene [name] is a 67 y.o. female a hx of right breast carcinoma 2016, s/p lumpectomy followed by mastectomy and chemoradiation.   PET scan on 07/10/17 shows new right hepatic lobe lesion concerning for mets.   She presented back in July for an image guided biopsy for further evaluation, however it could not be seen on [name] at that time and biopsy was not performed.  MR done on 10/21/2017 showed slight interval enlargement of the segment 8 liver lesion but no new hepatic lesions.  She is here again today for another attempt at biopsy of the lesion.  She is NPO. No blood thinners.   Past Medical History:  Diagnosis Date  . Anemia    hx of - during 1st round chemo  . Anxiety   . Arthritis    in fingers, shoulders  . Back pain   . Balance problem   . Breast cancer ([place])   . Breast cancer of upper-outer quadrant of right female breast ([place]) 07/22/2014  . Bunion, right foot   . Clumsiness   . Constipation   . Depression   . Diabetes mellitus without complication ([place])    36+ years  . Eating disorder    binge eating  . Epistaxis 08/26/2014  . Fatty liver   . HCAP (healthcare-associated pneumonia) 12/18/2014  . Headache   . Heart murmur    never had any problems  . History of radiation therapy 04/05/15-05/19/15   right chest wall was treated to 50.4 Gy in 28 fractions, right mastectomy scar was treated to 10 Gy in 5 fractions  . Hyperlipidemia   . Hypertension   . Joint pain   . Multiple food allergies   . Neuromuscular disorder ([place])    diabetic neuropathy  . Obesity   . Obstructive sleep apnea on CPAP    uses cpap nightly  . Ovarian cyst rupture    possible  . Pneumonia   . Pneumonia 11/2014  . Radiation 04/05/15-05/19/15   right chest wall 50.4 Gy, mastectomy scar 10 Gy  . Sleep  apnea   . Thyroid nodule 06/2014    Past Surgical History:  Procedure Laterality Date  . APPENDECTOMY    . BIOPSY THYROID Bilateral 06/2014   2 nodules - Benign   . BREAST LUMPECTOMY WITH RADIOACTIVE SEED AND SENTINEL LYMPH NODE BIOPSY Right 12/27/2014   Procedure: BREAST LUMPECTOMY WITH RADIOACTIVE SEED AND SENTINEL LYMPH NODE BIOPSY;  Surgeon: Stark Klein, MD;  Location: [place];  Service: General;  Laterality: Right;  . BREAST REDUCTION SURGERY Bilateral 01/06/2015   Procedure: BILATERAL BREAST REDUCTION, RIGHT ONCOPLASTIC RECONSTRUCTION),LEFT BREAST REDUCTION FOR SYMMETRY;  Surgeon: [name], MD;  Location: [place];  Service: Plastics;  Laterality: Bilateral;  . BREATH TEK H PYLORI N/A 09/15/2012   Procedure: BREATH TEK H PYLORI;  Surgeon: Pedro Earls, MD;  Location: [facility];  Service: General;  Laterality: N/A;  . BUNIONECTOMY Left 12/2013  . CARPAL TUNNEL RELEASE Right   . CARPAL TUNNEL RELEASE Left   . CATARACT EXTRACTION     6/19  . COLONOSCOPY    . DUPUYTREN CONTRACTURE RELEASE    . GASTRIC ROUX-EN-Y N/A 11/02/2012   Procedure: LAPAROSCOPIC ROUX-EN-Y GASTRIC;  Surgeon: Pedro Earls, MD;  Location: [facility];  Service: General;  Laterality: N/A;  . IR GENERIC HISTORICAL  03/18/2016   IR US GUIDE VASC ACCESS LEFT 03/18/2016 Markus Daft, MD [facility]  . IR GENERIC HISTORICAL  03/18/2016   IR FLUORO GUIDE CV LINE LEFT 03/18/2016 Markus Daft, MD [facility]  . LAPAROSCOPIC APPENDECTOMY N/A 07/22/2015   Procedure: APPENDECTOMY LAPAROSCOPIC;  Surgeon: Michael Boston, MD;  Location: [facility];  Service: General;  Laterality: N/A;  . MASTECTOMY Right 02/15/2015   modified  . MODIFIED MASTECTOMY Right 02/15/2015   Procedure: RIGHT MODIFIED RADICAL MASTECTOMY;  Surgeon: Stark Klein, MD;  Location: [place];  Service: General;  Laterality: Right;  . PORT-A-CATH REMOVAL Left 10/10/2015   Procedure: PORT REMOVAL;  Surgeon: Stark Klein, MD;  Location: [place];  Service:  General;  Laterality: Left;  . PORTACATH PLACEMENT Left 08/02/2014   Procedure: INSERTION PORT-A-CATH;  Surgeon: Stark Klein, MD;  Location: [place];  Service: General;  Laterality: Left;  . PORTACATH PLACEMENT N/A 11/05/2016   Procedure: INSERTION PORT-A-CATH;  Surgeon: Stark Klein, MD;  Location: [place];  Service: General;  Laterality: N/A;  . ROTATOR CUFF REPAIR Right 2005  . SCAR REVISION Right 12/08/2015   Procedure: COMPLEX REPAIR RIGHT CHEST 10-15CM;  Surgeon: [name], MD;  Location: [place];  Service: Plastics;  Laterality: Right;  . TOE SURGERY  1970s   bone spur  . TRIGGER FINGER RELEASE     x3    Allergies: Nsaids and Tape  Medications: Prior to Admission medications   Medication Sig Start Date End Date Taking? Authorizing Provider  buPROPion (WELLBUTRIN SR) 200 MG 12 hr tablet TAKE 1 TABLET BY MOUTH DAILY AT 12 NOON. 10/15/17  Yes Beasley, Caren D, MD  CALCIUM CITRATE-VITAMIN D PO Take 1 tablet by mouth 3 (three) times daily.    Yes [provider]  cholecalciferol (VITAMIN D) 1000 units tablet Take 1,000 Units by mouth daily.   Yes [provider]  Cyanocobalamin (VITAMIN B-12 PO) Take 1 tablet by mouth every 14 (fourteen) days.   Yes [provider]  Insulin Glargine (BASAGLAR KWIKPEN) 100 UNIT/ML SOPN INJECT 0.12 MLS (12 UNITS TOTAL) INTO THE SKIN DAILY. 09/29/17  Yes Leafy Ro, Caren D, MD  losartan (COZAAR) 100 MG tablet Take 1 tablet (100 mg total) by mouth daily. 09/24/17  Yes Beasley, Caren D, MD  Melatonin 3 MG CAPS Take 3 mg by mouth at bedtime.    Yes [provider]  metFORMIN (GLUCOPHAGE) 500 MG tablet Take 1 tablet (500 mg total) by mouth daily. 10/15/17  Yes Dennard Nip D, MD  Pediatric Multivitamins-Iron (FLINTSTONES PLUS IRON) chewable tablet Chew 1 tablet by mouth 2 (two) times daily.   Yes [provider]  Probiotic Product ([employer] PROBIOTIC PO) Take 1 capsule by mouth daily.     Yes [provider]  Propylene Glycol (SYSTANE BALANCE OP) Place 1-2 drops into both eyes 3 (three) times daily as needed (for dry eyes).    Yes [provider]  senna-docusate (SENNA PLUS) 8.6-50 MG tablet Take 1 tablet by mouth daily as needed for moderate constipation.    Yes [provider]  topiramate (TOPAMAX) 25 MG tablet Take 1 tablet (25 mg total) by mouth daily. 10/15/17  Yes Beasley, Caren D, MD  VICTOZA 18 MG/3ML SOPN INJECT 1.8MG (0.3 MLS) INTO THE SKIN EVERY MORNING Patient taking differently: Inject 1.8 mg into the skin every morning.  10/27/17  Yes Beasley, Caren D, MD  vortioxetine HBr (TRINTELLIX) 20 MG TABS Take 20 mg by mouth at bedtime. 02/24/17  Yes Eber Jones, MD  acetaminophen (TYLENOL) 325 MG tablet Take 650 mg by mouth 2 (two) times daily as needed for moderate pain or headache.     [provider]  diphenhydrAMINE (BENADRYL) 25 MG tablet Take 25 mg by mouth daily as needed for allergies or sleep.     [provider]  Insulin Pen Needle (BD PEN NEEDLE NANO U/F) 32G X 4 MM MISC 1 Package by Does not apply route daily at 12 noon. 09/24/17   Starlyn Skeans, MD  [facility] DELICA LANCETS 89F MISC 1 Package by Does not apply route daily. 09/24/17   Starlyn Skeans, MD     Family History  Problem Relation Age of Onset  . CVA Mother   . Heart disease Mother   . Sudden death Mother   . Diabetes Father   . Heart failure Father   . Hypertension Father   . Kidney disease Father   . Obesity Father   . Hypertension Sister   . Diabetes Brother   . Breast cancer Paternal Grandmother   . Diabetes Paternal Uncle     Social History   Socioeconomic History  . Marital status: Married    Spouse name: [name]  . Number of children: 0  . Years of education: Bachelor's  . Highest education level: Not on file  Occupational History  . Occupation: Pensions consultant: [employer]  . Financial  resource strain: Not on file  . Food insecurity:    Worry: Not on file    Inability: Not on file  . Transportation needs:    Medical: Not on file    Non-medical: Not on file  Tobacco Use  . Smoking status: Never Smoker  . Smokeless tobacco: Never Used  Substance and Sexual Activity  . Alcohol use: Yes    Alcohol/week: 1.0 standard drinks    Types: 1 Standard drinks or equivalent per week    Comment: social  . Drug use: No  . Sexual activity: Yes    Partners: Male    Birth control/protection: Post-menopausal, Other-see comments    Comment: husband vasectomy  Lifestyle  . Physical activity:    Days per week: Not on file    Minutes per session: Not on file  . Stress: Not on file  Relationships  . Social connections:    Talks on phone: Not on file    Gets together: Not on file    Attends religious service: Not on file    Active member of club or organization: Not on file    Attends meetings of clubs or organizations: Not on file    Relationship status: Not on file  Other Topics Concern  . Not on file  Social History Narrative   Patient is married [name]) and lives at home with her husband.   Patient does not have any children.   Patient is working for a [employer].   Patient has a Bachelor's degree.   Patient is right-handed.   Patient does not drink any caffeine.     Review of Systems: A 12 point ROS discussed and pertinent positives are indicated in the HPI above.  All other systems are negative.  Review of Systems  Vital Signs: BP 128/63   Pulse (!) 59   Temp 97.6 F (36.4 C)   Ht 5\' 9"  (1.753 m)   Wt 90.7 kg   LMP 10/22/2007   SpO2 100%   BMI 29.53  kg/m   Physical Exam  Constitutional: She is oriented to person, place, and time. She appears well-developed.  HENT:  Head: Normocephalic and atraumatic.  Eyes: EOM are normal.  Neck: Normal range of motion.  Cardiovascular: Normal rate, regular rhythm and normal heart sounds.  Pulmonary/Chest:  Effort normal and breath sounds normal.  Abdominal: Soft. She exhibits no distension. There is no tenderness.  Musculoskeletal: Normal range of motion.  Neurological: She is alert and oriented to person, place, and time.  Skin: Skin is warm and dry.  Psychiatric: She has a normal mood and affect. Her behavior is normal. Judgment and thought content normal.  Vitals reviewed.   Imaging: Mr Liver W Wo Contrast  Result Date: 10/21/2017 CLINICAL DATA:  Followup liver lesion. History of breast cancer with initial diagnosis 2015. EXAM: MRI ABDOMEN WITHOUT AND WITH CONTRAST TECHNIQUE: Multiplanar multisequence MR imaging of the abdomen was performed both before and after the administration of intravenous contrast. CONTRAST:  48mL MULTIHANCE GADOBENATE DIMEGLUMINE 529 MG/ML IV SOLN COMPARISON:  MRI 08/02/2017 and PET-CT 07/10/2017. FINDINGS: Lower chest: The lung bases are clear of an acute process. No worrisome pulmonary lesions or pleural effusion. No pericardial effusion. Hepatobiliary: Again demonstrated is a peripheral segment 8 liver lesion which is diffusion positive and demonstrates rim like contrast enhancement. This has enlarged slightly since the prior MRI. On that study it measured a maximum of 14 mm and now measures a maximum of 18 mm. No new liver lesions are identified. No intra or extrahepatic biliary dilatation. Small gallstones are noted the gallbladder. Pancreas:  No mass, inflammation or ductal dilatation. Spleen: Normal size. Stable small splenic lesion, likely benign cyst. Adrenals/Urinary Tract: The adrenal glands and kidneys are unremarkable. Stable thickened lateral limb of the left adrenal gland. Stomach/Bowel: Visualized portions within the abdomen are unremarkable. Vascular/Lymphatic: The aorta and branch vessels are patent. The major venous structures are patent. Slight interval enlargement of celiac axis and hepatoduodenal ligament lymph nodes. There is a 15 mm short axis celiac axis  lymph node (series 11, image 43). This previously measured 11 mm. 12.5 mm hepatoduodenal ligament lymph node on series 11, image 45 previously measured 9 mm. No new abdominal lymphadenopathy. Other:  No ascites or abdominal wall hernia. Musculoskeletal: No significant bony findings. IMPRESSION: 1. Slight interval enlargement of the segment 8 liver lesion but no new hepatic lesions. 2. Slight interval enlargement of upper abdominal lymph nodes. 3. Cholelithiasis. Electronically Signed   By: Marijo Sanes M.D.   On: 10/21/2017 16:23   Mr Ankle Left Wo Contrast  Result Date: 10/21/2017 CLINICAL DATA:  Painful knot on the anterior aspect of the left ankle for 1 year. EXAM: MRI OF THE LEFT ANKLE WITHOUT CONTRAST TECHNIQUE: Multiplanar, multisequence MR imaging of the ankle was performed. No intravenous contrast was administered. COMPARISON:  None. FINDINGS: TENDONS Peroneal: Intact. Posteromedial: Intact. Anterior: There is focal thickening of the tibialis anterior at and just superior to the tibiotalar joint. The tendon measures 1.8 cm transverse by 1.1 cm AP by 2.4 cm craniocaudal at the site of the lesion. Signal intensity within the tendon in this location is heterogeneous with areas of mild T2 hyperintensity present. The tendon is intact. Achilles: Intact. Plantar Fascia: Normal. LIGAMENTS Lateral: Intact. Medial: Intact. CARTILAGE Ankle Joint: Normal. Subtalar Joints/Sinus Tarsi: Normal. Bones: The patient has an os trigonum measuring 0.9 cm AP by 0.6 cm craniocaudal by approximately 1.5 cm transverse. There is extensive cystic change and mild edema about the synchondrosis. Small vascular remnant in  the calcaneus is noted. Other: None. IMPRESSION: Focal thickening of the tibialis anterior tendon as described above has an appearance most suggestive of chronic interstitial tear, tendinosis and scar. Fibroma within the tendon is also possible. Although the location is atypical, xanthoma is also within the  differential. The lesion should be easily amenable to biopsy. Electronically Signed   By: Inge Rise M.D.   On: 10/21/2017 15:19    Labs:  CBC: Recent Labs    09/01/17 0803 09/23/17 1242 10/14/17 0811 11/03/17 0631  WBC 4.7 3.9 3.4* 3.9*  HGB 11.1* 10.5* 10.5* 10.6*  HCT 33.1* 31.2* 31.2* 32.6*  PLT 110* 78* 60* 77*    COAGS: Recent Labs    07/30/17 1222 11/03/17 0631  INR 1.12 1.13  APTT 32 37*    BMP: Recent Labs    08/12/17 1132 09/01/17 0803 09/23/17 1242 10/14/17 0811  NA 141 140 145 143  K 4.3 3.6 3.8 4.0  CL 108 108 113* 112*  CO2 25 20* 24 23  GLUCOSE 86 126* 99 145*  BUN 38* 50* 22 24*  CALCIUM 9.4 9.7 9.5 9.4  CREATININE 1.63* 1.92* 1.43* 1.49*  GFRNONAA 32* 26* 38* 36*  GFRAA 37* 31* 44* 41*    LIVER FUNCTION TESTS: Recent Labs    08/12/17 1132 09/01/17 0803 09/23/17 1242 10/14/17 0811  BILITOT 0.6 0.7 0.7 0.8  AST 42* 55* 48* 61*  ALT 82* 76* 57* 70*  ALKPHOS 106 127* 125 135*  PROT 6.6 7.1 6.7 6.7  ALBUMIN 4.0 4.0 3.7 3.7    TUMOR MARKERS: No results for input(s): AFPTM, CEA, CA199, CHROMGRNA in the last 8760 hours.  Assessment and Plan:  History of breast cancer with liver lesion.  Will proceed with image guided biopsy today by Dr. Vernard Gambles.  Risks and benefits discussed with the patient including, but not limited to bleeding, infection, damage to adjacent structures or low yield requiring additional tests.  All of the patient's questions were answered, patient is agreeable to proceed. Consent signed and in chart.  Thank you for this interesting consult.  I greatly enjoyed meeting EVANGELIA WHITAKER and look forward to participating in their care.  A copy of this report was sent to the requesting provider on this date.  Electronically Signed: Murrell Redden, PA-C   11/03/2017, 7:38 AM      I spent a total of    25 Minutes in face to face in clinical consultation, greater than 50% of which was  counseling/coordinating care for US guided biopsy of liver lesion.

## 2017-11-03 NOTE — Discharge Instructions (Signed)
Needle Biopsy, Care After °These instructions give you information about caring for yourself after your procedure. Your doctor may also give you more specific instructions. Call your doctor if you have any problems or questions after your procedure. °Follow these instructions at home: °· Rest as told by your doctor. °· Take medicines only as told by your doctor. °· There are many different ways to close and cover the biopsy site, including stitches (sutures), skin glue, and adhesive strips. Follow instructions from your doctor about: °? How to take care of your biopsy site. °? When and how you should change your bandage (dressing). °? When you should remove your dressing. °? Removing whatever was used to close your biopsy site. °· Check your biopsy site every day for signs of infection. Watch for: °? Redness, swelling, or pain. °? Fluid, blood, or pus. °Contact a doctor if: °· You have a fever. °· You have redness, swelling, or pain at the biopsy site, and it lasts longer than a few days. °· You have fluid, blood, or pus coming from the biopsy site. °· You feel sick to your stomach (nauseous). °· You throw up (vomit). °Get help right away if: °· You are short of breath. °· You have trouble breathing. °· Your chest hurts. °· You feel dizzy or you pass out (faint). °· You have bleeding that does not stop with pressure or a bandage. °· You cough up blood. °· Your belly (abdomen) hurts. °This information is not intended to replace advice given to you by your health care provider. Make sure you discuss any questions you have with your health care provider. °Document Released: 12/21/2007 Document Revised: 06/15/2015 Document Reviewed: 01/03/2014 °Elsevier Interactive Patient Education © 2018 Elsevier Inc[employer] °Moderate Conscious Sedation, Adult, Care After °These instructions provide you with information about caring for yourself after your procedure. Your health care provider may also give you more specific instructions.  Your treatment has been planned according to current medical practices, but problems sometimes occur. Call your health care provider if you have any problems or questions after your procedure. °What can I expect after the procedure? °After your procedure, it is common: °· To feel sleepy for several hours. °· To feel clumsy and have poor balance for several hours. °· To have poor judgment for several hours. °· To vomit if you eat too soon. ° °Follow these instructions at home: °For at least 24 hours after the procedure: ° °· Do not: °? Participate in activities where you could fall or become injured. °? Drive. °? Use heavy machinery. °? Drink alcohol. °? Take sleeping pills or medicines that cause drowsiness. °? Make important decisions or sign legal documents. °? Take care of children on your own. °· Rest. °Eating and drinking °· Follow the diet recommended by your health care provider. °· If you vomit: °? Drink water, juice, or soup when you can drink without vomiting. °? Make sure you have little or no nausea before eating solid foods. °General instructions °· Have a responsible adult stay with you until you are awake and alert. °· Take over-the-counter and prescription medicines only as told by your health care provider. °· If you smoke, do not smoke without supervision. °· Keep all follow-up visits as told by your health care provider. This is important. °Contact a health care provider if: °· You keep feeling nauseous or you keep vomiting. °· You feel light-headed. °· You develop a rash. °· You have a fever. °Get help right away if: °· You   have trouble breathing. °This information is not intended to replace advice given to you by your health care provider. Make sure you discuss any questions you have with your health care provider. °Document Released: 10/28/2012 Document Revised: 06/12/2015 Document Reviewed: 04/29/2015 °Elsevier Interactive Patient Education © 2018 Elsevier Inc. ° °

## 2017-11-04 ENCOUNTER — Inpatient Hospital Stay: Payer: Medicare Other

## 2017-11-04 ENCOUNTER — Inpatient Hospital Stay: Payer: Medicare Other | Admitting: Adult Health

## 2017-11-04 DIAGNOSIS — Z95828 Presence of other vascular implants and grafts: Secondary | ICD-10-CM

## 2017-11-04 DIAGNOSIS — Z171 Estrogen receptor negative status [ER-]: Secondary | ICD-10-CM

## 2017-11-04 DIAGNOSIS — Z5112 Encounter for antineoplastic immunotherapy: Secondary | ICD-10-CM | POA: Diagnosis not present

## 2017-11-04 DIAGNOSIS — C50411 Malignant neoplasm of upper-outer quadrant of right female breast: Secondary | ICD-10-CM

## 2017-11-04 LAB — CMP (CANCER CENTER ONLY)
ALT: 75 U/L — ABNORMAL HIGH (ref 0–44)
AST: 67 U/L — ABNORMAL HIGH (ref 15–41)
Albumin: 3.5 g/dL (ref 3.5–5.0)
Alkaline Phosphatase: 160 U/L — ABNORMAL HIGH (ref 38–126)
Anion gap: 8 (ref 5–15)
BUN: 23 mg/dL (ref 8–23)
CO2: 22 mmol/L (ref 22–32)
Calcium: 9.2 mg/dL (ref 8.9–10.3)
Chloride: 111 mmol/L (ref 98–111)
Creatinine: 1.58 mg/dL — ABNORMAL HIGH (ref 0.44–1.00)
GFR, Est AFR Am: 39 mL/min — ABNORMAL LOW (ref >60)
GFR, Estimated: 33 mL/min — ABNORMAL LOW (ref >60)
Glucose, Bld: 277 mg/dL — ABNORMAL HIGH (ref 70–99)
Potassium: 4.1 mmol/L (ref 3.5–5.1)
Sodium: 141 mmol/L (ref 135–145)
Total Bilirubin: 0.7 mg/dL (ref 0.3–1.2)
Total Protein: 6.6 g/dL (ref 6.5–8.1)

## 2017-11-04 LAB — CBC WITH DIFFERENTIAL (CANCER CENTER ONLY)
Abs Immature Granulocytes: 0.01 10*3/uL (ref 0.00–0.07)
Basophils Absolute: 0 10*3/uL (ref 0.0–0.1)
Basophils Relative: 0 %
Eosinophils Absolute: 0.1 10*3/uL (ref 0.0–0.5)
Eosinophils Relative: 3 %
HCT: 31.5 % — ABNORMAL LOW (ref 36.0–46.0)
Hemoglobin: 10.4 g/dL — ABNORMAL LOW (ref 12.0–15.0)
Immature Granulocytes: 0 %
Lymphocytes Relative: 19 %
Lymphs Abs: 0.7 10*3/uL (ref 0.7–4.0)
MCH: 31 pg (ref 26.0–34.0)
MCHC: 33 g/dL (ref 30.0–36.0)
MCV: 94 fL (ref 80.0–100.0)
Monocytes Absolute: 0.3 10*3/uL (ref 0.1–1.0)
Monocytes Relative: 7 %
Neutro Abs: 2.5 10*3/uL (ref 1.7–7.7)
Neutrophils Relative %: 71 %
Platelet Count: 66 10*3/uL — ABNORMAL LOW (ref 150–400)
RBC: 3.35 MIL/uL — ABNORMAL LOW (ref 3.87–5.11)
RDW: 13.4 % (ref 11.5–15.5)
WBC Count: 3.5 10*3/uL — ABNORMAL LOW (ref 4.0–10.5)
nRBC: 0 % (ref 0.0–0.2)

## 2017-11-04 MED ORDER — HEPARIN SOD (PORK) LOCK FLUSH 100 UNIT/ML IV SOLN
500.0000 [IU] | Freq: Once | INTRAVENOUS | Status: AC | PRN
  Administered 2017-11-04: 500 [IU] via INTRAVENOUS
  Filled 2017-11-04: qty 5

## 2017-11-04 MED ORDER — SODIUM CHLORIDE 0.9 % IJ SOLN
10.0000 mL | INTRAMUSCULAR | Status: DC | PRN
  Administered 2017-11-04: 10 mL via INTRAVENOUS
  Filled 2017-11-04: qty 10

## 2017-11-04 NOTE — Patient Instructions (Signed)
Implanted Port Home Guide An implanted port is a type of central line that is placed under the skin. Central lines are used to provide IV access when treatment or nutrition needs to be given through a person's veins. Implanted ports are used for long-term IV access. An implanted port may be placed because:  You need IV medicine that would be irritating to the small veins in your hands or arms.  You need long-term IV medicines, such as antibiotics.  You need IV nutrition for a long period.  You need frequent blood draws for lab tests.  You need dialysis.  Implanted ports are usually placed in the chest area, but they can also be placed in the upper arm, the abdomen, or the leg. An implanted port has two main parts:  Reservoir. The reservoir is round and will appear as a small, raised area under your skin. The reservoir is the part where a needle is inserted to give medicines or draw blood.  Catheter. The catheter is a thin, flexible tube that extends from the reservoir. The catheter is placed into a large vein. Medicine that is inserted into the reservoir goes into the catheter and then into the vein.  How will I care for my incision site? Do not get the incision site wet. Bathe or shower as directed by your health care provider. How is my port accessed? Special steps must be taken to access the port:  Before the port is accessed, a numbing cream can be placed on the skin. This helps numb the skin over the port site.  Your health care provider uses a sterile technique to access the port. ? Your health care provider must put on a mask and sterile gloves. ? The skin over your port is cleaned carefully with an antiseptic and allowed to dry. ? The port is gently pinched between sterile gloves, and a needle is inserted into the port.  Only "non-coring" port needles should be used to access the port. Once the port is accessed, a blood return should be checked. This helps ensure that the port  is in the vein and is not clogged.  If your port needs to remain accessed for a constant infusion, a clear (transparent) bandage will be placed over the needle site. The bandage and needle will need to be changed every week, or as directed by your health care provider.  Keep the bandage covering the needle clean and dry. Do not get it wet. Follow your health care provider's instructions on how to take a shower or bath while the port is accessed.  If your port does not need to stay accessed, no bandage is needed over the port.  What is flushing? Flushing helps keep the port from getting clogged. Follow your health care provider's instructions on how and when to flush the port. Ports are usually flushed with saline solution or a medicine called heparin. The need for flushing will depend on how the port is used.  If the port is used for intermittent medicines or blood draws, the port will need to be flushed: ? After medicines have been given. ? After blood has been drawn. ? As part of routine maintenance.  If a constant infusion is running, the port may not need to be flushed.  How long will my port stay implanted? The port can stay in for as long as your health care provider thinks it is needed. When it is time for the port to come out, surgery will be   done to remove it. The procedure is similar to the one performed when the port was put in. When should I seek immediate medical care? When you have an implanted port, you should seek immediate medical care if:  You notice a bad smell coming from the incision site.  You have swelling, redness, or drainage at the incision site.  You have more swelling or pain at the port site or the surrounding area.  You have a fever that is not controlled with medicine.  This information is not intended to replace advice given to you by your health care provider. Make sure you discuss any questions you have with your health care provider. Document  Released: 01/07/2005 Document Revised: 06/15/2015 Document Reviewed: 09/14/2012 Elsevier Interactive Patient Education  2017 Elsevier Inc.  

## 2017-11-05 ENCOUNTER — Ambulatory Visit (INDEPENDENT_AMBULATORY_CARE_PROVIDER_SITE_OTHER): Payer: Medicare Other | Admitting: Family Medicine

## 2017-11-05 VITALS — BP 129/62 | HR 66 | Temp 97.7°F | Ht 69.0 in | Wt 195.0 lb

## 2017-11-05 DIAGNOSIS — E669 Obesity, unspecified: Secondary | ICD-10-CM | POA: Diagnosis not present

## 2017-11-05 DIAGNOSIS — Z794 Long term (current) use of insulin: Secondary | ICD-10-CM | POA: Diagnosis not present

## 2017-11-05 DIAGNOSIS — E119 Type 2 diabetes mellitus without complications: Secondary | ICD-10-CM

## 2017-11-05 DIAGNOSIS — Z6830 Body mass index (BMI) 30.0-30.9, adult: Secondary | ICD-10-CM | POA: Diagnosis not present

## 2017-11-06 NOTE — Progress Notes (Signed)
Office: [phone]  /  Fax: [phone]   HPI:   Chief Complaint: OBESITY Darlene [name] is here to discuss her progress with her obesity treatment plan. She is on the keep a food journal with 1300-1500 calories and 80+ grams of protein daily and is following her eating plan approximately 50 % of the time. She states she is doing water aerobics for 45 minutes 3 times per week. Darlene [name] continues to lose weight but has been stressed with recent scan showing her liver mets have not responded to her chemo. She had a CT guided biopsy and is waiting for the pathology results. She is working on recognizing emotional eating and trying to be mindful about increasing fruits and vegetables.  Her weight is 195 lb (88.5 kg) today and has had a weight loss of 1 pound over a period of 3 weeks since her last visit. She has lost 30 lbs since starting treatment with Darlene [name].  Diabetes II Darlene [name] has a diagnosis of diabetes type II. Darlene [name] states fasting BGs range mostly in 120's snd she denies hypoglycemia. Last A1c was 5.8. She is doing well with diet overall, but she has had some emotional eating. She has been working on intensive lifestyle modifications including diet, exercise, and weight loss to help control her blood glucose levels.  ALLERGIES: Allergies  Allergen Reactions  . Nsaids Other (See Comments)    H/o gastric bypass - avoid NSAIDs  . Tape Hives, Rash and Other (See Comments)    Adhesives in bandaids    MEDICATIONS: Current Outpatient Medications on File Prior to Visit  Medication Sig Dispense Refill  . acetaminophen (TYLENOL) 325 MG tablet Take 650 mg by mouth 2 (two) times daily as needed for moderate pain or headache.     [name] buPROPion (WELLBUTRIN SR) 200 MG 12 hr tablet TAKE 1 TABLET BY MOUTH DAILY AT 12 NOON. 30 tablet 0  . CALCIUM CITRATE-VITAMIN D PO Take 1 tablet by mouth 3 (three) times daily.     . cholecalciferol (VITAMIN D) 1000 units tablet Take 1,000 Units by mouth daily.    .  Cyanocobalamin (VITAMIN B-12 PO) Take 1 tablet by mouth every 14 (fourteen) days.    . diphenhydrAMINE (BENADRYL) 25 MG tablet Take 25 mg by mouth daily as needed for allergies or sleep.     . Insulin Glargine (BASAGLAR KWIKPEN) 100 UNIT/ML SOPN INJECT 0.12 MLS (12 UNITS TOTAL) INTO THE SKIN DAILY. 5 pen 0  . Insulin Pen Needle (BD PEN NEEDLE NANO U/F) 32G X 4 MM MISC 1 Package by Does not apply route daily at 12 noon. 100 each 0  . losartan (COZAAR) 100 MG tablet Take 1 tablet (100 mg total) by mouth daily. 30 tablet 0  . Melatonin 3 MG CAPS Take 3 mg by mouth at bedtime.     . metFORMIN (GLUCOPHAGE) 500 MG tablet Take 1 tablet (500 mg total) by mouth daily. 30 tablet 0  . ONETOUCH DELICA LANCETS 31D MISC 1 Package by Does not apply route daily. 100 each 0  . Pediatric Multivitamins-Iron (FLINTSTONES PLUS IRON) chewable tablet Chew 1 tablet by mouth 2 (two) times daily.    . Probiotic Product ([employer] PROBIOTIC PO) Take 1 capsule by mouth daily.     [name] Propylene Glycol (SYSTANE BALANCE OP) Place 1-2 drops into both eyes 3 (three) times daily as needed (for dry eyes).     [name] senna-docusate (SENNA PLUS) 8.6-50 MG tablet Take 1 tablet by mouth daily as needed for moderate  constipation.     . topiramate (TOPAMAX) 25 MG tablet Take 1 tablet (25 mg total) by mouth daily. 30 tablet 0  . VICTOZA 18 MG/3ML SOPN INJECT 1.8MG (0.3 MLS) INTO THE SKIN EVERY MORNING (Patient taking differently: Inject 1.8 mg into the skin every morning. ) 3 pen 0  . vortioxetine HBr (TRINTELLIX) 20 MG TABS Take 20 mg by mouth at bedtime. 30 tablet 0   No current facility-administered medications on file prior to visit.     PAST MEDICAL HISTORY: Past Medical History:  Diagnosis Date  . Anemia    hx of - during 1st round chemo  . Anxiety   . Arthritis    in fingers, shoulders  . Back pain   . Balance problem   . Breast cancer ([place])   . Breast cancer of upper-outer quadrant of right female breast ([place]) 07/22/2014  . Bunion,  right foot   . Clumsiness   . Constipation   . Depression   . Diabetes mellitus without complication ([facility])    97+ years  . Eating disorder    binge eating  . Epistaxis 08/26/2014  . Fatty liver   . HCAP (healthcare-associated pneumonia) 12/18/2014  . Headache   . Heart murmur    never had any problems  . History of radiation therapy 04/05/15-05/19/15   right chest wall was treated to 50.4 Gy in 28 fractions, right mastectomy scar was treated to 10 Gy in 5 fractions  . Hyperlipidemia   . Hypertension   . Joint pain   . Multiple food allergies   . Neuromuscular disorder ([place])    diabetic neuropathy  . Obesity   . Obstructive sleep apnea on CPAP    uses cpap nightly  . Ovarian cyst rupture    possible  . Pneumonia   . Pneumonia 11/2014  . Radiation 04/05/15-05/19/15   right chest wall 50.4 Gy, mastectomy scar 10 Gy  . Sleep apnea   . Thyroid nodule 06/2014    PAST SURGICAL HISTORY: Past Surgical History:  Procedure Laterality Date  . APPENDECTOMY    . BIOPSY THYROID Bilateral 06/2014   2 nodules - Benign   . BREAST LUMPECTOMY WITH RADIOACTIVE SEED AND SENTINEL LYMPH NODE BIOPSY Right 12/27/2014   Procedure: BREAST LUMPECTOMY WITH RADIOACTIVE SEED AND SENTINEL LYMPH NODE BIOPSY;  Surgeon: Stark Klein, MD;  Location: [facility];  Service: General;  Laterality: Right;  . BREAST REDUCTION SURGERY Bilateral 01/06/2015   Procedure: BILATERAL BREAST REDUCTION, RIGHT ONCOPLASTIC RECONSTRUCTION),LEFT BREAST REDUCTION FOR SYMMETRY;  Surgeon: [name], MD;  Location: [facility];  Service: Plastics;  Laterality: Bilateral;  . BREATH TEK H PYLORI N/A 09/15/2012   Procedure: BREATH TEK H PYLORI;  Surgeon: Pedro Earls, MD;  Location: [facility];  Service: General;  Laterality: N/A;  . BUNIONECTOMY Left 12/2013  . CARPAL TUNNEL RELEASE Right   . CARPAL TUNNEL RELEASE Left   . CATARACT EXTRACTION     6/19  . COLONOSCOPY    . DUPUYTREN CONTRACTURE RELEASE    . GASTRIC  ROUX-EN-Y N/A 11/02/2012   Procedure: LAPAROSCOPIC ROUX-EN-Y GASTRIC;  Surgeon: Pedro Earls, MD;  Location: [facility];  Service: General;  Laterality: N/A;  . IR GENERIC HISTORICAL  03/18/2016   IR Darlene [name] GUIDE VASC ACCESS LEFT 03/18/2016 Markus Daft, MD [facility]  . IR GENERIC HISTORICAL  03/18/2016   IR FLUORO GUIDE CV LINE LEFT 03/18/2016 Markus Daft, MD [facility]  . LAPAROSCOPIC APPENDECTOMY N/A 07/22/2015   Procedure: APPENDECTOMY  LAPAROSCOPIC;  Surgeon: Michael Boston, MD;  Location: [facility];  Service: General;  Laterality: N/A;  . MASTECTOMY Right 02/15/2015   modified  . MODIFIED MASTECTOMY Right 02/15/2015   Procedure: RIGHT MODIFIED RADICAL MASTECTOMY;  Surgeon: Stark Klein, MD;  Location: [facility];  Service: General;  Laterality: Right;  . PORT-A-CATH REMOVAL Left 10/10/2015   Procedure: PORT REMOVAL;  Surgeon: Stark Klein, MD;  Location: [facility];  Service: General;  Laterality: Left;  . PORTACATH PLACEMENT Left 08/02/2014   Procedure: INSERTION PORT-A-CATH;  Surgeon: Stark Klein, MD;  Location: [facility];  Service: General;  Laterality: Left;  . PORTACATH PLACEMENT N/A 11/05/2016   Procedure: INSERTION PORT-A-CATH;  Surgeon: Stark Klein, MD;  Location: [facility];  Service: General;  Laterality: N/A;  . ROTATOR CUFF REPAIR Right 2005  . SCAR REVISION Right 12/08/2015   Procedure: COMPLEX REPAIR RIGHT CHEST 10-15CM;  Surgeon: [name], MD;  Location: [facility];  Service: Plastics;  Laterality: Right;  . TOE SURGERY  1970s   bone spur  . TRIGGER FINGER RELEASE     x3    SOCIAL HISTORY: Social History   Tobacco Use  . Smoking status: Never Smoker  . Smokeless tobacco: Never Used  Substance Use Topics  . Alcohol use: Yes    Alcohol/week: 1.0 standard drinks    Types: 1 Standard drinks or equivalent per week    Comment: social  . Drug use: No    FAMILY HISTORY: Family History  Problem Relation Age of Onset  . CVA Mother   . Heart disease  Mother   . Sudden death Mother   . Diabetes Father   . Heart failure Father   . Hypertension Father   . Kidney disease Father   . Obesity Father   . Hypertension Sister   . Diabetes Brother   . Breast cancer Paternal Grandmother   . Diabetes Paternal Uncle     ROS: Review of Systems  Constitutional: Positive for weight loss.  Endo/Heme/Allergies:       Negative hypoglycemia    PHYSICAL EXAM: Blood pressure 129/62, pulse 66, temperature 97.7 F (36.5 C), temperature source Oral, height 5\' 9"  (1.753 m), weight 195 lb (88.5 kg), last menstrual period 10/22/2007, SpO2 100 %. Body mass index is 28.8 kg/m. Physical Exam  Constitutional: She is oriented to person, place, and time. She appears well-developed and well-nourished.  Cardiovascular: Normal rate.  Pulmonary/Chest: Effort normal.  Musculoskeletal: Normal range of motion.  Neurological: She is oriented to person, place, and time.  Skin: Skin is warm and dry.  Psychiatric: She has a normal mood and affect. Her behavior is normal.  Vitals reviewed.   RECENT LABS AND TESTS: BMET    Component Value Date/Time   NA 141 11/04/2017 0931   NA 143 03/11/2017 0841   NA 140 01/07/2017 0758   K 4.1 11/04/2017 0931   K 4.1 01/07/2017 0758   CL 111 11/04/2017 0931   CO2 22 11/04/2017 0931   CO2 24 01/07/2017 0758   GLUCOSE 277 (H) 11/04/2017 0931   GLUCOSE 134 01/07/2017 0758   BUN 23 11/04/2017 0931   BUN 40 (H) 03/11/2017 0841   BUN 33.6 (H) 01/07/2017 0758   CREATININE 1.58 (H) 11/04/2017 0931   CREATININE 1.5 (H) 01/07/2017 0758   CALCIUM 9.2 11/04/2017 0931   CALCIUM 9.3 01/07/2017 0758   GFRNONAA 33 (L) 11/04/2017 0931   GFRNONAA 55 (L) 02/18/2013 0827   GFRAA 39 (L) 11/04/2017 6962  GFRAA 63 02/18/2013 0827   Lab Results  Component Value Date   HGBA1C 5.8 (H) 09/03/2017   HGBA1C 6.1 (H) 03/11/2017   HGBA1C 8.2 (H) 11/05/2016   HGBA1C 8.1 (H) 08/09/2016   HGBA1C 6.5 (H) 01/03/2015   Lab Results    Component Value Date   INSULIN 15.3 03/11/2017   INSULIN 18.3 08/09/2016   CBC    Component Value Date/Time   WBC 3.5 (L) 11/04/2017 0931   WBC 3.9 (L) 11/03/2017 0631   RBC 3.35 (L) 11/04/2017 0931   HGB 10.4 (L) 11/04/2017 0931   HGB 10.8 (L) 01/07/2017 0758   HCT 31.5 (L) 11/04/2017 0931   HCT 31.9 (L) 01/07/2017 0758   PLT 66 (L) 11/04/2017 0931   PLT 126 (L) 01/07/2017 0758   MCV 94.0 11/04/2017 0931   MCV 90.8 01/07/2017 0758   MCH 31.0 11/04/2017 0931   MCHC 33.0 11/04/2017 0931   RDW 13.4 11/04/2017 0931   RDW 14.8 (H) 01/07/2017 0758   LYMPHSABS 0.7 11/04/2017 0931   LYMPHSABS 1.1 01/07/2017 0758   MONOABS 0.3 11/04/2017 0931   MONOABS 0.3 01/07/2017 0758   EOSABS 0.1 11/04/2017 0931   EOSABS 0.1 01/07/2017 0758   EOSABS 0.1 08/09/2016 1229   BASOSABS 0.0 11/04/2017 0931   BASOSABS 0.0 01/07/2017 0758   Iron/TIBC/Ferritin/ %Sat    Component Value Date/Time   IRON 58 02/18/2013 0827   TIBC 305 02/18/2013 0827   FERRITIN 133 10/31/2014 0816   IRONPCTSAT 19 (L) 02/18/2013 0827   Lipid Panel     Component Value Date/Time   CHOL 160 09/03/2017 1349   TRIG 111 09/03/2017 1349   HDL 59 09/03/2017 1349   CHOLHDL 2.9 02/18/2013 0827   VLDL 23 02/18/2013 0827   LDLCALC 79 09/03/2017 1349   Hepatic Function Panel     Component Value Date/Time   PROT 6.6 11/04/2017 0931   PROT 6.9 03/11/2017 0841   PROT 6.7 01/07/2017 0758   ALBUMIN 3.5 11/04/2017 0931   ALBUMIN 4.5 03/11/2017 0841   ALBUMIN 3.6 01/07/2017 0758   AST 67 (H) 11/04/2017 0931   AST 51 (H) 01/07/2017 0758   ALT 75 (H) 11/04/2017 0931   ALT 82 (H) 01/07/2017 0758   ALKPHOS 160 (H) 11/04/2017 0931   ALKPHOS 115 01/07/2017 0758   BILITOT 0.7 11/04/2017 0931   BILITOT 0.72 01/07/2017 0758      Component Value Date/Time   TSH 1.54 02/07/2017   TSH 2.170 08/09/2016 1229    ASSESSMENT AND PLAN: Type 2 diabetes mellitus without complication, with long-term current use of insulin  (HCC)  Class 1 obesity with serious comorbidity and body mass index (BMI) of 30.0 to 30.9 in adult, unspecified obesity type - Starting BMI greater then 30  PLAN:  Diabetes II Darlene [name] has been given extensive diabetes education by myself today including ideal fasting and post-prandial blood glucose readings, individual ideal Hgb A1c goals and hypoglycemia prevention. We discussed the importance of good blood sugar control to decrease the likelihood of diabetic complications such as nephropathy, neuropathy, limb loss, blindness, coronary artery disease, and death. We discussed the importance of intensive lifestyle modification including diet, exercise and weight loss as the first line treatment for diabetes. Darlene [name] agrees to continue her diabetes medications, diet, and exercise, and will continue to monitor. Darlene [name] agrees to follow up with our clinic in 3 weeks.  I spent > than 50% of the 15 minute visit on counseling as documented in the note.  Obesity  Darlene [name] is currently in the action stage of change. As such, her goal is to continue with weight loss efforts She has agreed to keep a food journal with 1300-1500 calories and 80+ grams of protein daily Darlene [name] has been instructed to work up to a goal of 150 minutes of combined cardio and strengthening exercise per week for weight loss and overall health benefits. We discussed the following Behavioral Modification Strategies today: decreasing simple carbohydrates, increasing vegetables, increase H20 intake, and emotional eating strategies Darlene [name] is to increase antioxidant rich foods.  Darlene [name] has agreed to follow up with our clinic in 3 weeks. She was informed of the importance of frequent follow up visits to maximize her success with intensive lifestyle modifications for her multiple health conditions.   OBESITY BEHAVIORAL INTERVENTION VISIT  Today's visit was # 26   Starting weight: 225 lbs Starting date: 08/09/16 Today's weight  : 195 lbs  Today's date: 11/05/2017 Total lbs lost to date: 22    ASK: We discussed the diagnosis of obesity with Darlene [name] today and Darlene [name] agreed to give Darlene [name] permission to discuss obesity behavioral modification therapy today.  ASSESS: Darlene [name] has the diagnosis of obesity and her BMI today is 28.78 Darlene [name] is in the action stage of change   ADVISE: Darlene [name] was educated on the multiple health risks of obesity as well as the benefit of weight loss to improve her health. She was advised of the need for long term treatment and the importance of lifestyle modifications to improve her current health and to decrease her risk of future health problems.  AGREE: Multiple dietary modification options and treatment options were discussed and  Darlene [name] agreed to follow the recommendations documented in the above note.  ARRANGE: Darlene [name] was educated on the importance of frequent visits to treat obesity as outlined per CMS and USPSTF guidelines and agreed to schedule her next follow up appointment today.  I, [name], am acting as transcriptionist for Dennard Nip, MD  I have reviewed the above documentation for accuracy and completeness, and I agree with the above. -Dennard Nip, MD

## 2017-11-07 ENCOUNTER — Other Ambulatory Visit: Payer: Self-pay | Admitting: Oncology

## 2017-11-07 NOTE — Progress Notes (Signed)
Lens liver biopsy is being read as upper gastrointestinal or pancreatic.  However we do not have any evidence of GI disease, including PET scan in June and colonoscopy 2016.  I requested a HER-2 (spoke with [facility] 11/07/2017.  If it is positive I think this will be the same cancer namely breast.  In any case we will consider repeating a colonoscopy and obtaining an EGD

## 2017-11-10 ENCOUNTER — Encounter: Payer: Self-pay | Admitting: Oncology

## 2017-11-10 ENCOUNTER — Telehealth: Payer: Self-pay | Admitting: Oncology

## 2017-11-10 ENCOUNTER — Inpatient Hospital Stay: Payer: Medicare Other

## 2017-11-10 ENCOUNTER — Inpatient Hospital Stay (HOSPITAL_BASED_OUTPATIENT_CLINIC_OR_DEPARTMENT_OTHER): Payer: Medicare Other | Admitting: Oncology

## 2017-11-10 VITALS — BP 135/66 | HR 84 | Temp 97.9°F | Resp 18 | Ht 69.0 in | Wt 200.5 lb

## 2017-11-10 DIAGNOSIS — Z23 Encounter for immunization: Secondary | ICD-10-CM

## 2017-11-10 DIAGNOSIS — C50411 Malignant neoplasm of upper-outer quadrant of right female breast: Secondary | ICD-10-CM | POA: Diagnosis not present

## 2017-11-10 DIAGNOSIS — Z171 Estrogen receptor negative status [ER-]: Secondary | ICD-10-CM

## 2017-11-10 DIAGNOSIS — C787 Secondary malignant neoplasm of liver and intrahepatic bile duct: Secondary | ICD-10-CM

## 2017-11-10 DIAGNOSIS — C773 Secondary and unspecified malignant neoplasm of axilla and upper limb lymph nodes: Secondary | ICD-10-CM

## 2017-11-10 DIAGNOSIS — Z5112 Encounter for antineoplastic immunotherapy: Secondary | ICD-10-CM | POA: Diagnosis not present

## 2017-11-10 DIAGNOSIS — C50911 Malignant neoplasm of unspecified site of right female breast: Secondary | ICD-10-CM

## 2017-11-10 LAB — CBC WITH DIFFERENTIAL (CANCER CENTER ONLY)
Abs Immature Granulocytes: 0.02 10*3/uL (ref 0.00–0.07)
Basophils Absolute: 0 10*3/uL (ref 0.0–0.1)
Basophils Relative: 0 %
Eosinophils Absolute: 0.1 10*3/uL (ref 0.0–0.5)
Eosinophils Relative: 3 %
HCT: 33 % — ABNORMAL LOW (ref 36.0–46.0)
Hemoglobin: 10.8 g/dL — ABNORMAL LOW (ref 12.0–15.0)
Immature Granulocytes: 0 %
Lymphocytes Relative: 25 %
Lymphs Abs: 1.2 10*3/uL (ref 0.7–4.0)
MCH: 30.8 pg (ref 26.0–34.0)
MCHC: 32.7 g/dL (ref 30.0–36.0)
MCV: 94 fL (ref 80.0–100.0)
Monocytes Absolute: 0.4 10*3/uL (ref 0.1–1.0)
Monocytes Relative: 8 %
Neutro Abs: 2.9 10*3/uL (ref 1.7–7.7)
Neutrophils Relative %: 64 %
Platelet Count: 78 10*3/uL — ABNORMAL LOW (ref 150–400)
RBC: 3.51 MIL/uL — ABNORMAL LOW (ref 3.87–5.11)
RDW: 13.7 % (ref 11.5–15.5)
WBC Count: 4.6 10*3/uL (ref 4.0–10.5)
nRBC: 0 % (ref 0.0–0.2)

## 2017-11-10 LAB — CMP (CANCER CENTER ONLY)
ALT: 74 U/L — ABNORMAL HIGH (ref 0–44)
AST: 66 U/L — ABNORMAL HIGH (ref 15–41)
Albumin: 3.9 g/dL (ref 3.5–5.0)
Alkaline Phosphatase: 169 U/L — ABNORMAL HIGH (ref 38–126)
Anion gap: 9 (ref 5–15)
BUN: 26 mg/dL — ABNORMAL HIGH (ref 8–23)
CO2: 25 mmol/L (ref 22–32)
Calcium: 9.9 mg/dL (ref 8.9–10.3)
Chloride: 110 mmol/L (ref 98–111)
Creatinine: 1.5 mg/dL — ABNORMAL HIGH (ref 0.44–1.00)
GFR, Est AFR Am: 41 mL/min — ABNORMAL LOW (ref >60)
GFR, Estimated: 35 mL/min — ABNORMAL LOW (ref >60)
Glucose, Bld: 86 mg/dL (ref 70–99)
Potassium: 4.4 mmol/L (ref 3.5–5.1)
Sodium: 144 mmol/L (ref 135–145)
Total Bilirubin: 0.7 mg/dL (ref 0.3–1.2)
Total Protein: 7.2 g/dL (ref 6.5–8.1)

## 2017-11-10 LAB — CEA (IN HOUSE-CHCC): CEA (CHCC-In House): 6.72 ng/mL — ABNORMAL HIGH (ref 0.00–5.00)

## 2017-11-10 MED ORDER — INFLUENZA VAC SPLIT HIGH-DOSE 0.5 ML IM SUSY
0.5000 mL | PREFILLED_SYRINGE | Freq: Once | INTRAMUSCULAR | Status: AC
  Administered 2017-11-10: 0.5 mL via INTRAMUSCULAR
  Filled 2017-11-10: qty 0.5

## 2017-11-10 MED ORDER — INFLUENZA VAC SPLIT QUAD 0.5 ML IM SUSY
PREFILLED_SYRINGE | INTRAMUSCULAR | Status: AC
  Filled 2017-11-10: qty 0.5

## 2017-11-10 NOTE — Telephone Encounter (Signed)
Gave patinet avs and calendar.   °

## 2017-11-10 NOTE — Progress Notes (Signed)
[name]  Telephone:(336) [phone] Fax:(336) [phone]   ID: [name] DOB: 1950-02-07  MR#: [id number]  MWU#:[id number]  Patient Care Team: Vernie Shanks, MD as PCP - General (Family Medicine) Himmelrich, Bryson Ha, RD (Inactive) as Dietitian (Bariatrics) Stark Klein, MD as Consulting Physician (General Surgery) Magrinat, Virgie Dad, MD as Consulting Physician (Oncology) Arloa Koh, MD as Consulting Physician (Radiation Oncology) Mauro Kaufmann, RN as Registered Nurse Rockwell Germany, RN as Registered Nurse Jacelyn Pi, MD as Consulting Physician (Endocrinology) Kem Boroughs, [place] as Nurse Practitioner (Family Medicine) [name], MD as Consulting Physician (Plastic Surgery) Larey Dresser, MD as Consulting Physician (Cardiology) Gery Pray, MD as Consulting Physician (Radiation Oncology) Juanita Craver, MD as Consulting Physician (Gastroenterology) PCP: Vernie Shanks, MD OTHER MD:  CHIEF COMPLAINT:  HER-2 positive, estrogen receptor negative locally recurrent disease  CURRENT TREATMENT:  trastuzumab  INTERVAL HISTORY:  Darlene [name] returns today for follow-up and treatment of her estrogen receptor negative but HER-2 amplified breast cancer accompanied by her husband. She is here to review liver biopsy results.  Since her last visit, she completed an abdominal ultrasound on 11/03/2017 showing: Limited visualization of right lobe liver lesion.  She underwent biopsy of the liver lesion on 11/03/2017 with pathology showing ([id number]): Adenocarcinoma most consistent with an upper gastrointestinal or pancreatobiliary primary. HER-2 testing was negative (recall her breast cancer has been estrogen receptor negative but HER-2 positive)   REVIEW OF SYSTEMS: Darlene [name] reports feeling well overall. She has returned to water exercise today since her biopsy. She is undergoing physical therapy to her left ankle. She continues with the wellness program  at [facility] with Dr. Leafy Ro for weight loss. She denies unusual headaches, visual changes, nausea, vomiting, or dizziness. There has been no unusual cough, phlegm production, or pleurisy. There has been no change in bowel or bladder habits. She denies unexplained fatigue or unexplained weight loss, bleeding, rash, or fever. A detailed review of systems was otherwise stable.    BREAST CANCER HISTORY: From the original intake note:  "Darlene [name]" had screening mammography at her gynecologist suggestive of a possible abnormality in the right breast. However right diagnostic mammogram 04/12/2013 at [facility] was negative. More recently however, she noted a lump in her right breast and brought it to her gynecologist's attention. On 07/18/2014 the patient had bilateral diagnostic mammography with tomosynthesis at [facility]. The breast density was category B. In the right breast upper outer quadrant there was an area of focal asymmetry with indistinct margins.[name] Ultrasound was obtained and showed in addition to the mass in question, measuring 1.7 cm, an abnormal-appearing lymph node in the right axillary tail.  On 07/19/2014 the patient underwent biopsy of the right breast massin question as well as a right axillary lymph node. Both were positive for invasive ductal carcinoma, grade 2, estrogen and progesterone receptor negative, with an MIB-1 of 90%, and HER-2 pending. Incidentally the lymph node biopsy showed a lymphocytic inflammatory component but no lymph node architecture.  Her subsequent history is as detailed below.  PAST MEDICAL HISTORY: Past Medical History:  Diagnosis Date  . Anemia    hx of - during 1st round chemo  . Anxiety   . Arthritis    in fingers, shoulders  . Back pain   . Balance problem   . Breast cancer ([place])   . Breast cancer of upper-outer quadrant of right female breast ([place]) 07/22/2014  . Bunion, right foot   . Clumsiness   . Constipation   .  Depression   . Diabetes mellitus without  complication ([place])    40+ years  . Eating disorder    binge eating  . Epistaxis 08/26/2014  . Fatty liver   . HCAP (healthcare-associated pneumonia) 12/18/2014  . Headache   . Heart murmur    never had any problems  . History of radiation therapy 04/05/15-05/19/15   right chest wall was treated to 50.4 Gy in 28 fractions, right mastectomy scar was treated to 10 Gy in 5 fractions  . Hyperlipidemia   . Hypertension   . Joint pain   . Multiple food allergies   . Neuromuscular disorder ([place])    diabetic neuropathy  . Obesity   . Obstructive sleep apnea on CPAP    uses cpap nightly  . Ovarian cyst rupture    possible  . Pneumonia   . Pneumonia 11/2014  . Radiation 04/05/15-05/19/15   right chest wall 50.4 Gy, mastectomy scar 10 Gy  . Sleep apnea   . Thyroid nodule 06/2014    PAST SURGICAL HISTORY: Past Surgical History:  Procedure Laterality Date  . APPENDECTOMY    . BIOPSY THYROID Bilateral 06/2014   2 nodules - Benign   . BREAST LUMPECTOMY WITH RADIOACTIVE SEED AND SENTINEL LYMPH NODE BIOPSY Right 12/27/2014   Procedure: BREAST LUMPECTOMY WITH RADIOACTIVE SEED AND SENTINEL LYMPH NODE BIOPSY;  Surgeon: Stark Klein, MD;  Location: [place] [facility];  Service: General;  Laterality: Right;  . BREAST REDUCTION SURGERY Bilateral 01/06/2015   Procedure: BILATERAL BREAST REDUCTION, RIGHT ONCOPLASTIC RECONSTRUCTION),LEFT BREAST REDUCTION FOR SYMMETRY;  Surgeon: [name], MD;  Location: [facility];  Service: Plastics;  Laterality: Bilateral;  . BREATH TEK H PYLORI N/A 09/15/2012   Procedure: BREATH TEK H PYLORI;  Surgeon: Pedro Earls, MD;  Location: [facility];  Service: General;  Laterality: N/A;  . BUNIONECTOMY Left 12/2013  . CARPAL TUNNEL RELEASE Right   . CARPAL TUNNEL RELEASE Left   . CATARACT EXTRACTION     6/19  . COLONOSCOPY    . DUPUYTREN CONTRACTURE RELEASE    . GASTRIC ROUX-EN-Y N/A 11/02/2012   Procedure: LAPAROSCOPIC ROUX-EN-Y GASTRIC;  Surgeon: Pedro Earls, MD;  Location: [facility];  Service: General;  Laterality: N/A;  . IR GENERIC HISTORICAL  03/18/2016   IR US GUIDE VASC ACCESS LEFT 03/18/2016 Markus Daft, MD [facility]  . IR GENERIC HISTORICAL  03/18/2016   IR FLUORO GUIDE CV LINE LEFT 03/18/2016 Markus Daft, MD [facility]  . LAPAROSCOPIC APPENDECTOMY N/A 07/22/2015   Procedure: APPENDECTOMY LAPAROSCOPIC;  Surgeon: Michael Boston, MD;  Location: [facility];  Service: General;  Laterality: N/A;  . MASTECTOMY Right 02/15/2015   modified  . MODIFIED MASTECTOMY Right 02/15/2015   Procedure: RIGHT MODIFIED RADICAL MASTECTOMY;  Surgeon: Stark Klein, MD;  Location: [place];  Service: General;  Laterality: Right;  . PORT-A-CATH REMOVAL Left 10/10/2015   Procedure: PORT REMOVAL;  Surgeon: Stark Klein, MD;  Location: [place];  Service: General;  Laterality: Left;  . PORTACATH PLACEMENT Left 08/02/2014   Procedure: INSERTION PORT-A-CATH;  Surgeon: Stark Klein, MD;  Location: [place];  Service: General;  Laterality: Left;  . PORTACATH PLACEMENT N/A 11/05/2016   Procedure: INSERTION PORT-A-CATH;  Surgeon: Stark Klein, MD;  Location: [facility];  Service: General;  Laterality: N/A;  . ROTATOR CUFF REPAIR Right 2005  . SCAR REVISION Right 12/08/2015   Procedure: COMPLEX REPAIR RIGHT CHEST 10-15CM;  Surgeon: [name], MD;  Location: [facility];  Service: Clinical cytogeneticist;  Laterality: Right;  . TOE SURGERY  1970s   bone spur  . TRIGGER FINGER RELEASE     x3    FAMILY HISTORY Family History  Problem Relation Age of Onset  . CVA Mother   . Heart disease Mother   . Sudden death Mother   . Diabetes Father   . Heart failure Father   . Hypertension Father   . Kidney disease Father   . Obesity Father   . Hypertension Sister   . Diabetes Brother   . Breast cancer Paternal Grandmother 80  . Diabetes Paternal Uncle   . Ovarian cancer Neg Hx    The patient's father died from complications of diabetes at age 59. The patient's  mother died at age 58 with a subarachnoid hemorrhage. The patient had one brother, one sister. The only breast cancer was the patient's paternal grandmother diagnosed in her 43s. There is no history of ovarian cancer in the family.  GYNECOLOGIC HISTORY:  Patient's last menstrual period was 10/22/2007.  menarche age 23, the patient is GX P0. She went through the change of life in 2011. She did not take hormone replacement.  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 Her husband [name] is a retired Visual merchandiser (he taught at [employer]).  [name] has 2 children from an earlier marriage, Rockey Situ who teaches in [place], and Kylyn Mcdade,  who works at the [employer] in in [place]. He has 1 grand son, aged 48. The patient and her husband are not church attenders.    ADVANCED DIRECTIVES:  Not in place   HEALTH MAINTENANCE: Social History   Tobacco Use  . Smoking status: Never Smoker  . Smokeless tobacco: Never Used  Substance Use Topics  . Alcohol use: Yes    Alcohol/week: 1.0 standard drinks    Types: 1 Standard drinks or equivalent per week    Comment: social  . Drug use: No     Colonoscopy:  May 2016  PAP:  Bone density: 07/01/2016 at [facility] showed normal, with a T score of -0.7  Lipid panel:  Allergies  Allergen Reactions  . Nsaids Other (See Comments)    H/o gastric bypass - avoid NSAIDs  . Tape Hives, Rash and Other (See Comments)    Adhesives in bandaids    Current Outpatient Medications  Medication Sig Dispense Refill  . acetaminophen (TYLENOL) 325 MG tablet Take 650 mg by mouth 2 (two) times daily as needed for moderate pain or headache.     [name] buPROPion (WELLBUTRIN SR) 200 MG 12 hr tablet TAKE 1 TABLET BY MOUTH DAILY AT 12 NOON. 30 tablet 0  . CALCIUM CITRATE-VITAMIN D PO Take 1 tablet by mouth 3 (three) times daily.     .  cholecalciferol (VITAMIN D) 1000 units tablet Take 1,000 Units by mouth daily.    . Cyanocobalamin (VITAMIN B-12 PO) Take 1 tablet by mouth every 14 (fourteen) days.    . diphenhydrAMINE (BENADRYL) 25 MG tablet Take 25 mg by mouth daily as needed for allergies or sleep.     . Insulin Glargine (BASAGLAR KWIKPEN) 100 UNIT/ML SOPN INJECT 0.12 MLS (12 UNITS TOTAL) INTO THE SKIN DAILY. 5 pen 0  . Insulin Pen Needle (BD PEN NEEDLE NANO U/F) 32G X 4 MM MISC 1 Package by Does not apply route daily at 12 noon. 100  each 0  . losartan (COZAAR) 100 MG tablet Take 1 tablet (100 mg total) by mouth daily. 30 tablet 0  . Melatonin 3 MG CAPS Take 3 mg by mouth at bedtime.     . metFORMIN (GLUCOPHAGE) 500 MG tablet Take 1 tablet (500 mg total) by mouth daily. 30 tablet 0  . ONETOUCH DELICA LANCETS 39J MISC 1 Package by Does not apply route daily. 100 each 0  . Pediatric Multivitamins-Iron (FLINTSTONES PLUS IRON) chewable tablet Chew 1 tablet by mouth 2 (two) times daily.    . Probiotic Product ([employer] PROBIOTIC PO) Take 1 capsule by mouth daily.     [name] Propylene Glycol (SYSTANE BALANCE OP) Place 1-2 drops into both eyes 3 (three) times daily as needed (for dry eyes).     [name] senna-docusate (SENNA PLUS) 8.6-50 MG tablet Take 1 tablet by mouth daily as needed for moderate constipation.     . topiramate (TOPAMAX) 25 MG tablet Take 1 tablet (25 mg total) by mouth daily. 30 tablet 0  . VICTOZA 18 MG/3ML SOPN INJECT 1.8MG(0.3 MLS) INTO THE SKIN EVERY MORNING (Patient taking differently: Inject 1.8 mg into the skin every morning. ) 3 pen 0  . vortioxetine HBr (TRINTELLIX) 20 MG TABS Take 20 mg by mouth at bedtime. 30 tablet 0   No current facility-administered medications for this visit.     OBJECTIVE: Middle-aged white woman who appears well Vitals:   11/10/17 1202  BP: 135/66  Pulse: 84  Resp: 18  Temp: 97.9 F (36.6 C)  SpO2: 100%     Body mass index is 29.61 kg/m.    ECOG FS:1 - Symptomatic but completely  ambulatory Filed Weights   11/10/17 1202  Weight: 200 lb 8 oz (90.9 kg)    LAB RESULTS:  CMP     Component Value Date/Time   NA 141 11/04/2017 0931   NA 143 03/11/2017 0841   NA 140 01/07/2017 0758   K 4.1 11/04/2017 0931   K 4.1 01/07/2017 0758   CL 111 11/04/2017 0931   CO2 22 11/04/2017 0931   CO2 24 01/07/2017 0758   GLUCOSE 277 (H) 11/04/2017 0931   GLUCOSE 134 01/07/2017 0758   BUN 23 11/04/2017 0931   BUN 40 (H) 03/11/2017 0841   BUN 33.6 (H) 01/07/2017 0758   CREATININE 1.58 (H) 11/04/2017 0931   CREATININE 1.5 (H) 01/07/2017 0758   CALCIUM 9.2 11/04/2017 0931   CALCIUM 9.3 01/07/2017 0758   PROT 6.6 11/04/2017 0931   PROT 6.9 03/11/2017 0841   PROT 6.7 01/07/2017 0758   ALBUMIN 3.5 11/04/2017 0931   ALBUMIN 4.5 03/11/2017 0841   ALBUMIN 3.6 01/07/2017 0758   AST 67 (H) 11/04/2017 0931   AST 51 (H) 01/07/2017 0758   ALT 75 (H) 11/04/2017 0931   ALT 82 (H) 01/07/2017 0758   ALKPHOS 160 (H) 11/04/2017 0931   ALKPHOS 115 01/07/2017 0758   BILITOT 0.7 11/04/2017 0931   BILITOT 0.72 01/07/2017 0758   GFRNONAA 33 (L) 11/04/2017 0931   GFRNONAA 55 (L) 02/18/2013 0827   GFRAA 39 (L) 11/04/2017 0931   GFRAA 63 02/18/2013 0827    INo results found for: SPEP, UPEP  Lab Results  Component Value Date   WBC 3.5 (L) 11/04/2017   NEUTROABS 2.5 11/04/2017   HGB 10.4 (L) 11/04/2017   HCT 31.5 (L) 11/04/2017   MCV 94.0 11/04/2017   PLT 66 (L) 11/04/2017      Chemistry      Component  Value Date/Time   NA 141 11/04/2017 0931   NA 143 03/11/2017 0841   NA 140 01/07/2017 0758   K 4.1 11/04/2017 0931   K 4.1 01/07/2017 0758   CL 111 11/04/2017 0931   CO2 22 11/04/2017 0931   CO2 24 01/07/2017 0758   BUN 23 11/04/2017 0931   BUN 40 (H) 03/11/2017 0841   BUN 33.6 (H) 01/07/2017 0758   CREATININE 1.58 (H) 11/04/2017 0931   CREATININE 1.5 (H) 01/07/2017 0758      Component Value Date/Time   CALCIUM 9.2 11/04/2017 0931   CALCIUM 9.3 01/07/2017 0758   ALKPHOS  160 (H) 11/04/2017 0931   ALKPHOS 115 01/07/2017 0758   AST 67 (H) 11/04/2017 0931   AST 51 (H) 01/07/2017 0758   ALT 75 (H) 11/04/2017 0931   ALT 82 (H) 01/07/2017 0758   BILITOT 0.7 11/04/2017 0931   BILITOT 0.72 01/07/2017 0758       No results found for: LABCA2  No components found for: [id number]  No results for input(s): INR in the last 168 hours.  Urinalysis    Component Value Date/Time   COLORURINE YELLOW 07/21/2015 [place] 07/21/2015 2328   LABSPEC 1.020 07/21/2015 2328   PHURINE 6.0 07/21/2015 2328   GLUCOSEU NEGATIVE 07/21/2015 2328   HGBUR TRACE (A) 07/21/2015 2328   BILIRUBINUR NEGATIVE 07/21/2015 2328   BILIRUBINUR neg 06/29/2013 1017   [place] 07/21/2015 2328   PROTEINUR 30 (A) 07/21/2015 2328   UROBILINOGEN negative 06/29/2013 1017   NITRITE NEGATIVE 07/21/2015 2328   LEUKOCYTESUR NEGATIVE 07/21/2015 2328    STUDIES: Mr Liver W Wo Contrast  Result Date: 10/21/2017 CLINICAL DATA:  Followup liver lesion. History of breast cancer with initial diagnosis 2015. EXAM: MRI ABDOMEN WITHOUT AND WITH CONTRAST TECHNIQUE: Multiplanar multisequence MR imaging of the abdomen was performed both before and after the administration of intravenous contrast. CONTRAST:  68m MULTIHANCE GADOBENATE DIMEGLUMINE 529 MG/ML IV SOLN COMPARISON:  MRI 08/02/2017 and PET-CT 07/10/2017. FINDINGS: Lower chest: The lung bases are clear of an acute process. No worrisome pulmonary lesions or pleural effusion. No pericardial effusion. Hepatobiliary: Again demonstrated is a peripheral segment 8 liver lesion which is diffusion positive and demonstrates rim like contrast enhancement. This has enlarged slightly since the prior MRI. On that study it measured a maximum of 14 mm and now measures a maximum of 18 mm. No new liver lesions are identified. No intra or extrahepatic biliary dilatation. Small gallstones are noted the gallbladder. Pancreas:  No mass, inflammation or  ductal dilatation. Spleen: Normal size. Stable small splenic lesion, likely benign cyst. Adrenals/Urinary Tract: The adrenal glands and kidneys are unremarkable. Stable thickened lateral limb of the left adrenal gland. Stomach/Bowel: Visualized portions within the abdomen are unremarkable. Vascular/Lymphatic: The aorta and branch vessels are patent. The major venous structures are patent. Slight interval enlargement of celiac axis and hepatoduodenal ligament lymph nodes. There is a 15 mm short axis celiac axis lymph node (series 11, image 43). This previously measured 11 mm. 12.5 mm hepatoduodenal ligament lymph node on series 11, image 45 previously measured 9 mm. No new abdominal lymphadenopathy. Other:  No ascites or abdominal wall hernia. Musculoskeletal: No significant bony findings. IMPRESSION: 1. Slight interval enlargement of the segment 8 liver lesion but no new hepatic lesions. 2. Slight interval enlargement of upper abdominal lymph nodes. 3. Cholelithiasis. Electronically Signed   By: PMarijo SanesM.D.   On: 10/21/2017 16:23   Mr Ankle Left Wo Contrast  Result Date: 10/21/2017 CLINICAL DATA:  Painful knot on the anterior aspect of the left ankle for 1 year. EXAM: MRI OF THE LEFT ANKLE WITHOUT CONTRAST TECHNIQUE: Multiplanar, multisequence MR imaging of the ankle was performed. No intravenous contrast was administered. COMPARISON:  None. FINDINGS: TENDONS Peroneal: Intact. Posteromedial: Intact. Anterior: There is focal thickening of the tibialis anterior at and just superior to the tibiotalar joint. The tendon measures 1.8 cm transverse by 1.1 cm AP by 2.4 cm craniocaudal at the site of the lesion. Signal intensity within the tendon in this location is heterogeneous with areas of mild T2 hyperintensity present. The tendon is intact. Achilles: Intact. Plantar Fascia: Normal. LIGAMENTS Lateral: Intact. Medial: Intact. CARTILAGE Ankle Joint: Normal. Subtalar Joints/Sinus Tarsi: Normal. Bones: The  patient has an os trigonum measuring 0.9 cm AP by 0.6 cm craniocaudal by approximately 1.5 cm transverse. There is extensive cystic change and mild edema about the synchondrosis. Small vascular remnant in the calcaneus is noted. Other: None. IMPRESSION: Focal thickening of the tibialis anterior tendon as described above has an appearance most suggestive of chronic interstitial tear, tendinosis and scar. Fibroma within the tendon is also possible. Although the location is atypical, xanthoma is also within the differential. The lesion should be easily amenable to biopsy. Electronically Signed   By: Inge Rise M.D.   On: 10/21/2017 15:19   US Abdomen Limited  Result Date: 11/03/2017 CLINICAL DATA:  History of breast carcinoma. Enlarging liver lesion. Biopsy requested. EXAM: ULTRASOUND ABDOMEN LIMITED COMPARISON:  MR 10/21/2017 and previous FINDINGS: Survey ultrasound of the liver was performed. The right lobe liver subcapsular lesion could be intermittently identified, but due to its position just inferior to the diaphragm and lung, which obscured imaging, visualization was inadequate to allow safe percutaneous needle biopsy. The patient was transferred to CT for completion of the biopsy. IMPRESSION: 1. Limited visualization of right lobe liver lesion. Patient transferred to CT for completion biopsy, dictated separately. Electronically Signed   By: Lucrezia Europe M.D.   On: 11/03/2017 11:14   Ct Biopsy  Result Date: 11/03/2017 CLINICAL DATA:  History of breast carcinoma. Enlarging liver lesion on therapy. Biopsy requested. EXAM: CT GUIDED CORE BIOPSY OF LIVER LESION ANESTHESIA/SEDATION: Intravenous Fentanyl and Versed were administered as conscious sedation during continuous monitoring of the patient's level of consciousness and physiological / cardiorespiratory status by the radiology RN, with a total moderate sedation time of 18 minutes. PROCEDURE: The procedure risks, benefits, and alternatives were  explained to the patient. Questions regarding the procedure were encouraged and answered. The patient understands and consents to the procedure. Select axial scans through the abdomen were obtained. The lesion was localized and an appropriate skin entry site was determined and marked. The operative field was prepped with chlorhexidinein a sterile fashion, and a sterile drape was applied covering the operative field. A sterile gown and sterile gloves were used for the procedure. Local anesthesia was provided with 1% Lidocaine. Under angled CT fluoroscopic guidance, a 17 gauge trocar needle was advanced to the margin of the lesion, avoiding lung. Once needle tip position was confirmed, coaxial 18-gauge core biopsy samples were obtained, submitted in formalin to surgical pathology. The guide needle was removed. Postprocedure scans show no regional hemorrhage, pneumothorax, or other apparent complication. The patient tolerated the procedure well. COMPLICATIONS: None immediate FINDINGS: Hypoechoic segment 8 liver lesion was localized. Representative core biopsy samples obtained as above. IMPRESSION: 1. Technically successful CT-guided core biopsy right liver lesion. Electronically Signed   By: Keturah Barre  Vernard Gambles M.D.   On: 11/03/2017 11:15     ASSESSMENT: 67 y.o. [place] woman status post right breast upper outer quadrant and right axillary lymph node biopsy 07/19/2014 for a cT2  pN1, stage IIB  invasive ductal carcinoma, grade 2, estrogen and progesterone receptor negative, with an MIB-1 of 90%, and HER-2 amplified  (1) neoadjuvant chemotherapy started 08/08/2014, consisting of carboplatin, docetaxel, trastuzumab and pertuzumab given every 3 weeks 6, completed 11/21/2014  (a) breast MRI after initial 3 cycles shows a significant response  (b) breast MRI after 6 cycles shows a complete radiologic response   (2) trastuzumab continued through 10/02/2015 (to end of capecitabine therapy)  (a) echo 02/13/2015 shows an  EF of 60-65%  (b)  echo 05/09/2015 shows an ejection fraction of 60-65%  (c) echocardiogram 06/27/2015 shows an ejection fraction of 55-60%.  (d) echocardiogram 01/22/2017 shows an ejection fraction of 55-60%  (3) right lumpectomy with sentinel lymph node biopsy on 12/27/14 showed a residual  ypT2 ypN1a, stage IIB invasive ductal carcinoma, grade 2, with repeat estrogen and progesterone receptors again negative  (4) status post right oncoplastic surgery (with left reduction mammoplasty) 01/06/2015 showing in the right breast multiple foci of intralymphatic carcinoma in random sections  (5)  Right modified radical mastectomy 02/15/2014 showed no residual invasive disease; there was DCIS but margins were negative; all 15 axillary lymph nodes were clear; ypTis ypN0  (a)  The patient has decided against reconstruction.  (6)  Postmastectomy radiation completed 05/18/2015 with capecitabine sensitization  (7) adjuvant capecitabine continued through 09/27/2015  (8) hepatic steatosis with increased fibrosis score (at risk for cirrhosis)  (9) thyroid biopsy (left lobe inferiorly) 2 shows benign tissue 06/28/2015  (10) LOCAL RECURRENCE:  (a) PET scan 12/06/2015 Notes a right retropectoral lymph node measuring 0.9 cm with an SUV max of 5.5  (b) repeat PET scan 03/11/2016 finds the right subpectoral lymph node to now measure 1.3 cm with an SUV max of 11.8.  (c) PET scan 05/07/2016 shows a significant response to T-DM 1  (d) PET scan 07/10/2017 is clear except for what appears to be a single new liver lesion   (11) started T-DM1 03/21/2016, completed 8 doses 08/13/2016  (a) PET scan 08/19/2016 shows a good response but measurable residual disease  (b) PET scan on 01/16/2017 shows a continuing response  (12) maintenance trastuzumab started 09/03/2016--changed to every 28 days as of 01/28/2017  (a) repeat echocardiogram 07/08/2016 shows an ejection fraction of 60-65%.  (b) echocardiogram on 10/08/2016  demonstrates LVEF of 55-60% (followed by Dr. Haroldine Laws)  (c) echo 01/22/2017 shows an ejection fraction in the 55-60% range  (d) echo 04/24/2017 shows an ejection fraction in the 55-60% range (recommendation for echo every 6 months by Dr. Aundra Dubin)  (e) trastuzumab changed to T-DM 1 after trastuzumab dose 07/15/2017  METASTATIC DISEASE: July 2019 (13)  Radiation to the area of persistent disease in the right upper chest completed 10/30/2016   (14) T-DM 1 resumed 08/12/2017, repeated every 21 days  (a) baseline liver MRI 08/02/2017 shows a 1.4 cm mass, "hot" on PET 07/10/2017  (b) repeat liver MRI 10/22/2018 3:19 cycles of T-DM 1, shows slight enlargement  (c) T-DM 1 discontinued after 09/23/2017 dose  (15) trastuzumab resumed 11/18/2017, to be repeated every 4 weeks  (18) liver biopsy 11/03/2017 shows adenocarcinoma, most consistent with an upper gastrointestinal primary; this tumor was estrogen receptor and HER-2 negative  PLAN: I spent approximately 30 minutes face to face with [name] with more than 50% of  that time spent in counseling and coordination of care.  We reviewed her pathology results in detail.  She understands that adenocarcinomas are large family of cancers, which include all the gastrointestinal cancers as well as many lung cancers, breast cancers, prostate, kidney, etc.  Pathologist scan narrow the possibilities down by doing certain tests and when those test were done on this tumor they did not type out as breast.  Instead they type out as a GI primary.  Note that she had a PET scan in June which did not show uptake except in the liver, she also has had an MRI of the liver as recently as 10/21/2017.  This showed no pancreatic mass or inflammation or ductal dilatation, but aside from the liver lesion there was slight interval enlargement of the celiac axis and hepatoduodenal ligament lymph nodes, which now is of increasing concern.  I am contacting her gastrointestinal  physician, Dr. Collene Mares, so we can proceed with EGD and repeat colonoscopy (her most recent colonoscopy was member 2015).  I am also sending some tumor markers today.  As far as breast cancer is concerned, we are going back to trastuzumab alone and she will receive this every 28 days, beginning next week.  She knows to call for any other issues that may develop before the next visit.    Magrinat, Virgie Dad, MD  11/10/17 12:17 PM Medical Oncology and Hematology [facility] [address] [place], [place] [address] Tel. [phone]    Fax. [phone]  IWilburn Mylar, am acting as scribe for Chauncey Cruel MD.  I, Lurline Del MD, have reviewed the above documentation for accuracy and completeness, and I agree with the above.

## 2017-11-11 LAB — CANCER ANTIGEN 19-9: CA 19-9: 13 U/mL (ref 0–35)

## 2017-11-11 LAB — CANCER ANTIGEN 27.29: CA 27.29: 21.8 U/mL (ref 0.0–38.6)

## 2017-11-14 ENCOUNTER — Other Ambulatory Visit: Payer: Self-pay | Admitting: Gastroenterology

## 2017-11-14 DIAGNOSIS — R945 Abnormal results of liver function studies: Secondary | ICD-10-CM

## 2017-11-17 ENCOUNTER — Telehealth: Payer: Self-pay

## 2017-11-17 NOTE — Telephone Encounter (Signed)
Spoke with pt concerning VM asking about needing labs with appt tomorrow.  Per NP no labs tomorrow unless pt preference, pt declined.  Pt to have labs with future appt's.  Pt saw Dr. Collene Mares on Friday 10/26.  Pt had a small biopsy of the stomach with soft tissue removed.  Results in 4-10 days.  Pt reports no polyps in colon.  NP aware.

## 2017-11-18 ENCOUNTER — Other Ambulatory Visit: Payer: Self-pay | Admitting: Gastroenterology

## 2017-11-18 ENCOUNTER — Other Ambulatory Visit: Payer: Self-pay | Admitting: Oncology

## 2017-11-18 ENCOUNTER — Inpatient Hospital Stay: Payer: Medicare Other

## 2017-11-18 ENCOUNTER — Ambulatory Visit (HOSPITAL_COMMUNITY)
Admission: RE | Admit: 2017-11-18 | Discharge: 2017-11-18 | Disposition: A | Discharge status: Home / Self Care | Payer: Medicare Other | Source: Ambulatory Visit | Attending: Gastroenterology | Admitting: Gastroenterology

## 2017-11-18 VITALS — BP 141/70 | HR 66 | Temp 97.8°F | Resp 18

## 2017-11-18 DIAGNOSIS — K766 Portal hypertension: Secondary | ICD-10-CM | POA: Diagnosis not present

## 2017-11-18 DIAGNOSIS — Z5112 Encounter for antineoplastic immunotherapy: Secondary | ICD-10-CM | POA: Diagnosis not present

## 2017-11-18 DIAGNOSIS — K8020 Calculus of gallbladder without cholecystitis without obstruction: Secondary | ICD-10-CM | POA: Insufficient documentation

## 2017-11-18 DIAGNOSIS — R945 Abnormal results of liver function studies: Secondary | ICD-10-CM | POA: Diagnosis present

## 2017-11-18 DIAGNOSIS — K7460 Unspecified cirrhosis of liver: Secondary | ICD-10-CM | POA: Insufficient documentation

## 2017-11-18 DIAGNOSIS — Z171 Estrogen receptor negative status [ER-]: Secondary | ICD-10-CM

## 2017-11-18 DIAGNOSIS — C787 Secondary malignant neoplasm of liver and intrahepatic bile duct: Secondary | ICD-10-CM

## 2017-11-18 DIAGNOSIS — C50411 Malignant neoplasm of upper-outer quadrant of right female breast: Secondary | ICD-10-CM

## 2017-11-18 MED ORDER — HEPARIN SOD (PORK) LOCK FLUSH 100 UNIT/ML IV SOLN
500.0000 [IU] | Freq: Once | INTRAVENOUS | Status: AC | PRN
  Administered 2017-11-18: 500 [IU]
  Filled 2017-11-18: qty 5

## 2017-11-18 MED ORDER — HEPARIN SOD (PORK) LOCK FLUSH 10 UNIT/ML IV SOLN
10.0000 [IU] | Freq: Once | INTRAVENOUS | Status: DC
  Filled 2017-11-18: qty 1

## 2017-11-18 MED ORDER — ACETAMINOPHEN 325 MG PO TABS
ORAL_TABLET | ORAL | Status: AC
  Filled 2017-11-18: qty 2

## 2017-11-18 MED ORDER — GADOBUTROL 1 MMOL/ML IV SOLN
9.0000 mL | Freq: Once | INTRAVENOUS | Status: AC | PRN
  Administered 2017-11-18: 9 mL via INTRAVENOUS

## 2017-11-18 MED ORDER — DIPHENHYDRAMINE HCL 25 MG PO CAPS
ORAL_CAPSULE | ORAL | Status: AC
  Filled 2017-11-18: qty 1

## 2017-11-18 MED ORDER — ACETAMINOPHEN 325 MG PO TABS
650.0000 mg | ORAL_TABLET | Freq: Once | ORAL | Status: AC
  Administered 2017-11-18: 650 mg via ORAL

## 2017-11-18 MED ORDER — DIPHENHYDRAMINE HCL 25 MG PO CAPS
25.0000 mg | ORAL_CAPSULE | Freq: Once | ORAL | Status: AC
  Administered 2017-11-18: 25 mg via ORAL

## 2017-11-18 MED ORDER — TRASTUZUMAB CHEMO 150 MG IV SOLR
6.0000 mg/kg | Freq: Once | INTRAVENOUS | Status: AC
  Administered 2017-11-18: 546 mg via INTRAVENOUS
  Filled 2017-11-18: qty 26

## 2017-11-18 MED ORDER — SODIUM CHLORIDE 0.9 % IV SOLN
Freq: Once | INTRAVENOUS | Status: AC
  Administered 2017-11-18: 10:00:00 via INTRAVENOUS
  Filled 2017-11-18: qty 250

## 2017-11-18 MED ORDER — SODIUM CHLORIDE 0.9% FLUSH
10.0000 mL | INTRAVENOUS | Status: DC | PRN
  Administered 2017-11-18: 10 mL
  Filled 2017-11-18: qty 10

## 2017-11-18 MED ORDER — HEPARIN SOD (PORK) LOCK FLUSH 100 UNIT/ML IV SOLN
INTRAVENOUS | Status: AC
  Filled 2017-11-18: qty 5

## 2017-11-18 NOTE — Progress Notes (Unsigned)
[place]  Telephone:(336) [phone] Fax:(336) [phone]   ID: [name] DOB: [birth year]  MR#: [id number]  YKZ#:[id number]  Patient Care Team: Vernie Shanks, MD as PCP - General (Family Medicine) Himmelrich, Bryson Ha, RD (Inactive) as Dietitian (Bariatrics) Stark Klein, MD as Consulting Physician (General Surgery) Magrinat, Virgie Dad, MD as Consulting Physician (Oncology) Arloa Koh, MD as Consulting Physician (Radiation Oncology) Mauro Kaufmann, RN as Registered Nurse Rockwell Germany, RN as Registered Nurse Jacelyn Pi, MD as Consulting Physician (Endocrinology) Kem Boroughs, [place] as Nurse Practitioner (Family Medicine) [name], MD as Consulting Physician (Plastic Surgery) Larey Dresser, MD as Consulting Physician (Cardiology) Gery Pray, MD as Consulting Physician (Radiation Oncology) Juanita Craver, MD as Consulting Physician (Gastroenterology) PCP: Vernie Shanks, MD OTHER MD:  CHIEF COMPLAINT:  HER-2 positive, estrogen receptor negative locally recurrent disease  CURRENT TREATMENT:  trastuzumab  INTERVAL HISTORY:  Darlene [name] returns today for follow-up and treatment of her estrogen receptor negative but HER-2 amplified breast cancer accompanied by her husband. She is here to review liver biopsy results.  Since her last visit, she completed an abdominal ultrasound on 11/03/2017 showing: Limited visualization of right lobe liver lesion.  She underwent biopsy of the liver lesion on 11/03/2017 with pathology showing ([id number]): Adenocarcinoma most consistent with an upper gastrointestinal or pancreatobiliary primary. HER-2 testing was negative (recall her breast cancer has been estrogen receptor negative but HER-2 positive)   REVIEW OF SYSTEMS: Darlene [name] reports feeling well overall. She has returned to water exercise today since her biopsy. She is undergoing physical therapy to her left ankle. She continues with the wellness program  at [facility] with Dr. Leafy Ro for weight loss. She denies unusual headaches, visual changes, nausea, vomiting, or dizziness. There has been no unusual cough, phlegm production, or pleurisy. There has been no change in bowel or bladder habits. She denies unexplained fatigue or unexplained weight loss, bleeding, rash, or fever. A detailed review of systems was otherwise stable.    BREAST CANCER HISTORY: From the original intake note:  "Darlene [name]" had screening mammography at her gynecologist suggestive of a possible abnormality in the right breast. However right diagnostic mammogram 04/12/2013 at [facility] was negative. More recently however, she noted a lump in her right breast and brought it to her gynecologist's attention. On 07/18/2014 the patient had bilateral diagnostic mammography with tomosynthesis at [facility]. The breast density was category B. In the right breast upper outer quadrant there was an area of focal asymmetry with indistinct margins.[name] Ultrasound was obtained and showed in addition to the mass in question, measuring 1.7 cm, an abnormal-appearing lymph node in the right axillary tail.  On 07/19/2014 the patient underwent biopsy of the right breast massin question as well as a right axillary lymph node. Both were positive for invasive ductal carcinoma, grade 2, estrogen and progesterone receptor negative, with an MIB-1 of 90%, and HER-2 pending. Incidentally the lymph node biopsy showed a lymphocytic inflammatory component but no lymph node architecture.  Her subsequent history is as detailed below.  PAST MEDICAL HISTORY: Past Medical History:  Diagnosis Date  . Anemia    hx of - during 1st round chemo  . Anxiety   . Arthritis    in fingers, shoulders  . Back pain   . Balance problem   . Breast cancer ([place])   . Breast cancer of upper-outer quadrant of right female breast ([place]) 07/22/2014  . Bunion, right foot   . Clumsiness   . Constipation   .  Depression   . Diabetes mellitus without  complication ([place])    63+ years  . Eating disorder    binge eating  . Epistaxis 08/26/2014  . Fatty liver   . HCAP (healthcare-associated pneumonia) 12/18/2014  . Headache   . Heart murmur    never had any problems  . History of radiation therapy 04/05/15-05/19/15   right chest wall was treated to 50.4 Gy in 28 fractions, right mastectomy scar was treated to 10 Gy in 5 fractions  . Hyperlipidemia   . Hypertension   . Joint pain   . Multiple food allergies   . Neuromuscular disorder ([place])    diabetic neuropathy  . Obesity   . Obstructive sleep apnea on CPAP    uses cpap nightly  . Ovarian cyst rupture    possible  . Pneumonia   . Pneumonia 11/2014  . Radiation 04/05/15-05/19/15   right chest wall 50.4 Gy, mastectomy scar 10 Gy  . Sleep apnea   . Thyroid nodule 06/2014    PAST SURGICAL HISTORY: Past Surgical History:  Procedure Laterality Date  . APPENDECTOMY    . BIOPSY THYROID Bilateral 06/2014   2 nodules - Benign   . BREAST LUMPECTOMY WITH RADIOACTIVE SEED AND SENTINEL LYMPH NODE BIOPSY Right 12/27/2014   Procedure: BREAST LUMPECTOMY WITH RADIOACTIVE SEED AND SENTINEL LYMPH NODE BIOPSY;  Surgeon: Stark Klein, MD;  Location: [place];  Service: General;  Laterality: Right;  . BREAST REDUCTION SURGERY Bilateral 01/06/2015   Procedure: BILATERAL BREAST REDUCTION, RIGHT ONCOPLASTIC RECONSTRUCTION),LEFT BREAST REDUCTION FOR SYMMETRY;  Surgeon: [name], MD;  Location: [place];  Service: Plastics;  Laterality: Bilateral;  . BREATH TEK H PYLORI N/A 09/15/2012   Procedure: BREATH TEK H PYLORI;  Surgeon: Pedro Earls, MD;  Location: [facility];  Service: General;  Laterality: N/A;  . BUNIONECTOMY Left 12/2013  . CARPAL TUNNEL RELEASE Right   . CARPAL TUNNEL RELEASE Left   . CATARACT EXTRACTION     6/19  . COLONOSCOPY    . DUPUYTREN CONTRACTURE RELEASE    . GASTRIC ROUX-EN-Y N/A 11/02/2012   Procedure: LAPAROSCOPIC ROUX-EN-Y GASTRIC;  Surgeon: Pedro Earls, MD;  Location: [facility];  Service: General;  Laterality: N/A;  . IR GENERIC HISTORICAL  03/18/2016   IR US GUIDE VASC ACCESS LEFT 03/18/2016 Markus Daft, MD [facility]  . IR GENERIC HISTORICAL  03/18/2016   IR FLUORO GUIDE CV LINE LEFT 03/18/2016 Markus Daft, MD [facility]  . LAPAROSCOPIC APPENDECTOMY N/A 07/22/2015   Procedure: APPENDECTOMY LAPAROSCOPIC;  Surgeon: Michael Boston, MD;  Location: [facility];  Service: General;  Laterality: N/A;  . MASTECTOMY Right 02/15/2015   modified  . MODIFIED MASTECTOMY Right 02/15/2015   Procedure: RIGHT MODIFIED RADICAL MASTECTOMY;  Surgeon: Stark Klein, MD;  Location: [place];  Service: General;  Laterality: Right;  . PORT-A-CATH REMOVAL Left 10/10/2015   Procedure: PORT REMOVAL;  Surgeon: Stark Klein, MD;  Location: [place];  Service: General;  Laterality: Left;  . PORTACATH PLACEMENT Left 08/02/2014   Procedure: INSERTION PORT-A-CATH;  Surgeon: Stark Klein, MD;  Location: [place];  Service: General;  Laterality: Left;  . PORTACATH PLACEMENT N/A 11/05/2016   Procedure: INSERTION PORT-A-CATH;  Surgeon: Stark Klein, MD;  Location: [place];  Service: General;  Laterality: N/A;  . ROTATOR CUFF REPAIR Right 2005  . SCAR REVISION Right 12/08/2015   Procedure: COMPLEX REPAIR RIGHT CHEST 10-15CM;  Surgeon: [name], MD;  Location: [place];  Service: Clinical cytogeneticist;  Laterality: Right;  . TOE SURGERY  1970s   bone spur  . TRIGGER FINGER RELEASE     x3    FAMILY HISTORY Family History  Problem Relation Age of Onset  . CVA Mother   . Heart disease Mother   . Sudden death Mother   . Diabetes Father   . Heart failure Father   . Hypertension Father   . Kidney disease Father   . Obesity Father   . Hypertension Sister   . Diabetes Brother   . Breast cancer Paternal Grandmother 86  . Diabetes Paternal Uncle   . Ovarian cancer Neg Hx    The patient's father died from complications of diabetes at age 65. The patient's  mother died at age 51 with a subarachnoid hemorrhage. The patient had one brother, one sister. The only breast cancer was the patient's paternal grandmother diagnosed in her 47s. There is no history of ovarian cancer in the family.  GYNECOLOGIC HISTORY:  Patient's last menstrual period was 10/22/2007.  menarche age 13, the patient is GX P0. She went through the change of life in 2011. She did not take hormone replacement.  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 Her husband [name] is a retired Visual merchandiser (he taught at [employer]).  [name] has 2 children from an earlier marriage, Rockey Situ who teaches in [place], and Karyme Mcconathy,  who works at the [employer] in in [employer]. He has 1 grand son, aged 55. The patient and her husband are not church attenders.    ADVANCED DIRECTIVES:  Not in place   HEALTH MAINTENANCE: Social History   Tobacco Use  . Smoking status: Never Smoker  . Smokeless tobacco: Never Used  Substance Use Topics  . Alcohol use: Yes    Alcohol/week: 1.0 standard drinks    Types: 1 Standard drinks or equivalent per week    Comment: social  . Drug use: No     Colonoscopy:  May 2016  PAP:  Bone density: 07/01/2016 at [facility] showed normal, with a T score of -0.7  Lipid panel:  Allergies  Allergen Reactions  . Nsaids Other (See Comments)    H/o gastric bypass - avoid NSAIDs  . Tape Hives, Rash and Other (See Comments)    Adhesives in bandaids    Current Outpatient Medications  Medication Sig Dispense Refill  . acetaminophen (TYLENOL) 325 MG tablet Take 650 mg by mouth 2 (two) times daily as needed for moderate pain or headache.     [name] buPROPion (WELLBUTRIN SR) 200 MG 12 hr tablet TAKE 1 TABLET BY MOUTH DAILY AT 12 NOON. 30 tablet 0  . CALCIUM CITRATE-VITAMIN D PO Take 1 tablet by mouth 3 (three) times daily.     .  cholecalciferol (VITAMIN D) 1000 units tablet Take 1,000 Units by mouth daily.    . Cyanocobalamin (VITAMIN B-12 PO) Take 1 tablet by mouth every 14 (fourteen) days.    . diphenhydrAMINE (BENADRYL) 25 MG tablet Take 25 mg by mouth daily as needed for allergies or sleep.     . Insulin Glargine (BASAGLAR KWIKPEN) 100 UNIT/ML SOPN INJECT 0.12 MLS (12 UNITS TOTAL) INTO THE SKIN DAILY. 5 pen 0  . Insulin Pen Needle (BD PEN NEEDLE NANO U/F) 32G X 4 MM MISC 1 Package by Does not apply route daily at 12 noon. 100  each 0  . losartan (COZAAR) 100 MG tablet Take 1 tablet (100 mg total) by mouth daily. 30 tablet 0  . Melatonin 3 MG CAPS Take 3 mg by mouth at bedtime.     . metFORMIN (GLUCOPHAGE) 500 MG tablet Take 1 tablet (500 mg total) by mouth daily. 30 tablet 0  . ONETOUCH DELICA LANCETS 26O MISC 1 Package by Does not apply route daily. 100 each 0  . Pediatric Multivitamins-Iron (FLINTSTONES PLUS IRON) chewable tablet Chew 1 tablet by mouth 2 (two) times daily.    . Probiotic Product ([employer]S PROBIOTIC PO) Take 1 capsule by mouth daily.     [name] Propylene Glycol (SYSTANE BALANCE OP) Place 1-2 drops into both eyes 3 (three) times daily as needed (for dry eyes).     [name] senna-docusate (SENNA PLUS) 8.6-50 MG tablet Take 1 tablet by mouth daily as needed for moderate constipation.     . topiramate (TOPAMAX) 25 MG tablet Take 1 tablet (25 mg total) by mouth daily. 30 tablet 0  . VICTOZA 18 MG/3ML SOPN INJECT 1.8MG(0.3 MLS) INTO THE SKIN EVERY MORNING (Patient taking differently: Inject 1.8 mg into the skin every morning. ) 3 pen 0  . vortioxetine HBr (TRINTELLIX) 20 MG TABS Take 20 mg by mouth at bedtime. 30 tablet 0   No current facility-administered medications for this visit.    Facility-Administered Medications Ordered in Other Visits  Medication Dose Route Frequency Provider Last Rate Last Dose  . heparin flush 10 UNIT/ML injection 10 Units  10 Units Intracatheter Once Juanita Craver, MD      . heparin lock flush  100 UNIT/ML injection             OBJECTIVE: Middle-aged white woman who appears well There were no vitals filed for this visit.   There is no height or weight on file to calculate BMI.    ECOG FS:1 - Symptomatic but completely ambulatory There were no vitals filed for this visit.  LAB RESULTS:  CMP     Component Value Date/Time   NA 144 11/10/2017 1305   NA 143 03/11/2017 0841   NA 140 01/07/2017 0758   K 4.4 11/10/2017 1305   K 4.1 01/07/2017 0758   CL 110 11/10/2017 1305   CO2 25 11/10/2017 1305   CO2 24 01/07/2017 0758   GLUCOSE 86 11/10/2017 1305   GLUCOSE 134 01/07/2017 0758   BUN 26 (H) 11/10/2017 1305   BUN 40 (H) 03/11/2017 0841   BUN 33.6 (H) 01/07/2017 0758   CREATININE 1.50 (H) 11/10/2017 1305   CREATININE 1.5 (H) 01/07/2017 0758   CALCIUM 9.9 11/10/2017 1305   CALCIUM 9.3 01/07/2017 0758   PROT 7.2 11/10/2017 1305   PROT 6.9 03/11/2017 0841   PROT 6.7 01/07/2017 0758   ALBUMIN 3.9 11/10/2017 1305   ALBUMIN 4.5 03/11/2017 0841   ALBUMIN 3.6 01/07/2017 0758   AST 66 (H) 11/10/2017 1305   AST 51 (H) 01/07/2017 0758   ALT 74 (H) 11/10/2017 1305   ALT 82 (H) 01/07/2017 0758   ALKPHOS 169 (H) 11/10/2017 1305   ALKPHOS 115 01/07/2017 0758   BILITOT 0.7 11/10/2017 1305   BILITOT 0.72 01/07/2017 0758   GFRNONAA 35 (L) 11/10/2017 1305   GFRNONAA 55 (L) 02/18/2013 0827   GFRAA 41 (L) 11/10/2017 1305   GFRAA 63 02/18/2013 0827    INo results found for: SPEP, UPEP  Lab Results  Component Value Date   WBC 4.6 11/10/2017   NEUTROABS 2.9 11/10/2017  HGB 10.8 (L) 11/10/2017   HCT 33.0 (L) 11/10/2017   MCV 94.0 11/10/2017   PLT 78 (L) 11/10/2017      Chemistry      Component Value Date/Time   NA 144 11/10/2017 1305   NA 143 03/11/2017 0841   NA 140 01/07/2017 0758   K 4.4 11/10/2017 1305   K 4.1 01/07/2017 0758   CL 110 11/10/2017 1305   CO2 25 11/10/2017 1305   CO2 24 01/07/2017 0758   BUN 26 (H) 11/10/2017 1305   BUN 40 (H) 03/11/2017 0841   BUN  33.6 (H) 01/07/2017 0758   CREATININE 1.50 (H) 11/10/2017 1305   CREATININE 1.5 (H) 01/07/2017 0758      Component Value Date/Time   CALCIUM 9.9 11/10/2017 1305   CALCIUM 9.3 01/07/2017 0758   ALKPHOS 169 (H) 11/10/2017 1305   ALKPHOS 115 01/07/2017 0758   AST 66 (H) 11/10/2017 1305   AST 51 (H) 01/07/2017 0758   ALT 74 (H) 11/10/2017 1305   ALT 82 (H) 01/07/2017 0758   BILITOT 0.7 11/10/2017 1305   BILITOT 0.72 01/07/2017 0758       No results found for: LABCA2  No components found for: [id number]  No results for input(s): INR in the last 168 hours.  Urinalysis    Component Value Date/Time   COLORURINE YELLOW 07/21/2015 [place] 07/21/2015 2328   LABSPEC 1.020 07/21/2015 2328   PHURINE 6.0 07/21/2015 2328   GLUCOSEU NEGATIVE 07/21/2015 2328   HGBUR TRACE (A) 07/21/2015 2328   BILIRUBINUR NEGATIVE 07/21/2015 2328   BILIRUBINUR neg 06/29/2013 1017   [place] 07/21/2015 2328   PROTEINUR 30 (A) 07/21/2015 2328   UROBILINOGEN negative 06/29/2013 1017   NITRITE NEGATIVE 07/21/2015 2328   LEUKOCYTESUR NEGATIVE 07/21/2015 2328    STUDIES: Mr Liver W Wo Contrast  Result Date: 10/21/2017 CLINICAL DATA:  Followup liver lesion. History of breast cancer with initial diagnosis 2015. EXAM: MRI ABDOMEN WITHOUT AND WITH CONTRAST TECHNIQUE: Multiplanar multisequence MR imaging of the abdomen was performed both before and after the administration of intravenous contrast. CONTRAST:  68m MULTIHANCE GADOBENATE DIMEGLUMINE 529 MG/ML IV SOLN COMPARISON:  MRI 08/02/2017 and PET-CT 07/10/2017. FINDINGS: Lower chest: The lung bases are clear of an acute process. No worrisome pulmonary lesions or pleural effusion. No pericardial effusion. Hepatobiliary: Again demonstrated is a peripheral segment 8 liver lesion which is diffusion positive and demonstrates rim like contrast enhancement. This has enlarged slightly since the prior MRI. On that study it measured a maximum of 14  mm and now measures a maximum of 18 mm. No new liver lesions are identified. No intra or extrahepatic biliary dilatation. Small gallstones are noted the gallbladder. Pancreas:  No mass, inflammation or ductal dilatation. Spleen: Normal size. Stable small splenic lesion, likely benign cyst. Adrenals/Urinary Tract: The adrenal glands and kidneys are unremarkable. Stable thickened lateral limb of the left adrenal gland. Stomach/Bowel: Visualized portions within the abdomen are unremarkable. Vascular/Lymphatic: The aorta and branch vessels are patent. The major venous structures are patent. Slight interval enlargement of celiac axis and hepatoduodenal ligament lymph nodes. There is a 15 mm short axis celiac axis lymph node (series 11, image 43). This previously measured 11 mm. 12.5 mm hepatoduodenal ligament lymph node on series 11, image 45 previously measured 9 mm. No new abdominal lymphadenopathy. Other:  No ascites or abdominal wall hernia. Musculoskeletal: No significant bony findings. IMPRESSION: 1. Slight interval enlargement of the segment 8 liver lesion but no new  hepatic lesions. 2. Slight interval enlargement of upper abdominal lymph nodes. 3. Cholelithiasis. Electronically Signed   By: Marijo Sanes M.D.   On: 10/21/2017 16:23   Mr Ankle Left Wo Contrast  Result Date: 10/21/2017 CLINICAL DATA:  Painful knot on the anterior aspect of the left ankle for 1 year. EXAM: MRI OF THE LEFT ANKLE WITHOUT CONTRAST TECHNIQUE: Multiplanar, multisequence MR imaging of the ankle was performed. No intravenous contrast was administered. COMPARISON:  None. FINDINGS: TENDONS Peroneal: Intact. Posteromedial: Intact. Anterior: There is focal thickening of the tibialis anterior at and just superior to the tibiotalar joint. The tendon measures 1.8 cm transverse by 1.1 cm AP by 2.4 cm craniocaudal at the site of the lesion. Signal intensity within the tendon in this location is heterogeneous with areas of mild T2 hyperintensity  present. The tendon is intact. Achilles: Intact. Plantar Fascia: Normal. LIGAMENTS Lateral: Intact. Medial: Intact. CARTILAGE Ankle Joint: Normal. Subtalar Joints/Sinus Tarsi: Normal. Bones: The patient has an os trigonum measuring 0.9 cm AP by 0.6 cm craniocaudal by approximately 1.5 cm transverse. There is extensive cystic change and mild edema about the synchondrosis. Small vascular remnant in the calcaneus is noted. Other: None. IMPRESSION: Focal thickening of the tibialis anterior tendon as described above has an appearance most suggestive of chronic interstitial tear, tendinosis and scar. Fibroma within the tendon is also possible. Although the location is atypical, xanthoma is also within the differential. The lesion should be easily amenable to biopsy. Electronically Signed   By: Inge Rise M.D.   On: 10/21/2017 15:19   US Abdomen Limited  Result Date: 11/03/2017 CLINICAL DATA:  History of breast carcinoma. Enlarging liver lesion. Biopsy requested. EXAM: ULTRASOUND ABDOMEN LIMITED COMPARISON:  MR 10/21/2017 and previous FINDINGS: Survey ultrasound of the liver was performed. The right lobe liver subcapsular lesion could be intermittently identified, but due to its position just inferior to the diaphragm and lung, which obscured imaging, visualization was inadequate to allow safe percutaneous needle biopsy. The patient was transferred to CT for completion of the biopsy. IMPRESSION: 1. Limited visualization of right lobe liver lesion. Patient transferred to CT for completion biopsy, dictated separately. Electronically Signed   By: Lucrezia Europe M.D.   On: 11/03/2017 11:14   Ct Biopsy  Result Date: 11/03/2017 CLINICAL DATA:  History of breast carcinoma. Enlarging liver lesion on therapy. Biopsy requested. EXAM: CT GUIDED CORE BIOPSY OF LIVER LESION ANESTHESIA/SEDATION: Intravenous Fentanyl and Versed were administered as conscious sedation during continuous monitoring of the patient's level of  consciousness and physiological / cardiorespiratory status by the radiology RN, with a total moderate sedation time of 18 minutes. PROCEDURE: The procedure risks, benefits, and alternatives were explained to the patient. Questions regarding the procedure were encouraged and answered. The patient understands and consents to the procedure. Select axial scans through the abdomen were obtained. The lesion was localized and an appropriate skin entry site was determined and marked. The operative field was prepped with chlorhexidinein a sterile fashion, and a sterile drape was applied covering the operative field. A sterile gown and sterile gloves were used for the procedure. Local anesthesia was provided with 1% Lidocaine. Under angled CT fluoroscopic guidance, a 17 gauge trocar needle was advanced to the margin of the lesion, avoiding lung. Once needle tip position was confirmed, coaxial 18-gauge core biopsy samples were obtained, submitted in formalin to surgical pathology. The guide needle was removed. Postprocedure scans show no regional hemorrhage, pneumothorax, or other apparent complication. The patient tolerated the procedure well.  COMPLICATIONS: None immediate FINDINGS: Hypoechoic segment 8 liver lesion was localized. Representative core biopsy samples obtained as above. IMPRESSION: 1. Technically successful CT-guided core biopsy right liver lesion. Electronically Signed   By: Lucrezia Europe M.D.   On: 11/03/2017 11:15     ASSESSMENT: 67 y.o. [place] woman status post right breast upper outer quadrant and right axillary lymph node biopsy 07/19/2014 for a cT2  pN1, stage IIB  invasive ductal carcinoma, grade 2, estrogen and progesterone receptor negative, with an MIB-1 of 90%, and HER-2 amplified  (1) neoadjuvant chemotherapy started 08/08/2014, consisting of carboplatin, docetaxel, trastuzumab and pertuzumab given every 3 weeks 6, completed 11/21/2014  (a) breast MRI after initial 3 cycles shows a  significant response  (b) breast MRI after 6 cycles shows a complete radiologic response   (2) trastuzumab continued through 10/02/2015 (to end of capecitabine therapy)  (a) echo 02/13/2015 shows an EF of 60-65%  (b)  echo 05/09/2015 shows an ejection fraction of 60-65%  (c) echocardiogram 06/27/2015 shows an ejection fraction of 55-60%.  (d) echocardiogram 01/22/2017 shows an ejection fraction of 55-60%  (3) right lumpectomy with sentinel lymph node biopsy on 12/27/14 showed a residual  ypT2 ypN1a, stage IIB invasive ductal carcinoma, grade 2, with repeat estrogen and progesterone receptors again negative  (4) status post right oncoplastic surgery (with left reduction mammoplasty) 01/06/2015 showing in the right breast multiple foci of intralymphatic carcinoma in random sections  (5)  Right modified radical mastectomy 02/15/2014 showed no residual invasive disease; there was DCIS but margins were negative; all 15 axillary lymph nodes were clear; ypTis ypN0  (a)  The patient has decided against reconstruction.  (6)  Postmastectomy radiation completed 05/18/2015 with capecitabine sensitization  (7) adjuvant capecitabine continued through 09/27/2015  (8) hepatic steatosis with increased fibrosis score (at risk for cirrhosis)  (9) thyroid biopsy (left lobe inferiorly) 2 shows benign tissue 06/28/2015  (10) LOCAL RECURRENCE:  (a) PET scan 12/06/2015 Notes a right retropectoral lymph node measuring 0.9 cm with an SUV max of 5.5  (b) repeat PET scan 03/11/2016 finds the right subpectoral lymph node to now measure 1.3 cm with an SUV max of 11.8.  (c) PET scan 05/07/2016 shows a significant response to T-DM 1  (d) PET scan 07/10/2017 is clear except for what appears to be a single new liver lesion   (11) started T-DM1 03/21/2016, completed 8 doses 08/13/2016  (a) PET scan 08/19/2016 shows a good response but measurable residual disease  (b) PET scan on 01/16/2017 shows a continuing  response  (12) maintenance trastuzumab started 09/03/2016--changed to every 28 days as of 01/28/2017  (a) repeat echocardiogram 07/08/2016 shows an ejection fraction of 60-65%.  (b) echocardiogram on 10/08/2016 demonstrates LVEF of 55-60% (followed by Dr. Haroldine Laws)  (c) echo 01/22/2017 shows an ejection fraction in the 55-60% range  (d) echo 04/24/2017 shows an ejection fraction in the 55-60% range (recommendation for echo every 6 months by Dr. 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  We reviewed her pathology results in detail.  She understands that adenocarcinomas are large family of cancers, which include all the gastrointestinal cancers as well as many lung cancers, breast cancers, prostate, kidney, etc.  Pathologist scan narrow the possibilities down by doing certain tests and when those test were done on this tumor they did not type out as breast.  Instead they type out as a GI primary.  Note that she had a PET scan in June which did not show uptake except in the liver, she also has had an MRI of the liver as recently as 10/21/2017.  This showed no pancreatic mass or inflammation or ductal  dilatation, but aside from the liver lesion there was slight interval enlargement of the celiac axis and hepatoduodenal ligament lymph nodes, which now is of increasing concern.  I am contacting her gastrointestinal physician, Dr. Collene Mares, so we can proceed with EGD and repeat colonoscopy (her most recent colonoscopy was member 2015).  I am also sending some tumor markers today.  As far as breast cancer is concerned, we are going back to trastuzumab alone and she will receive this every 28 days, beginning next week.  She knows to call for any other issues that may develop before the next visit.    Magrinat, Virgie Dad, MD  11/18/17 9:37 AM Medical Oncology and Hematology [facility] [address] [place], [place] [address] Tel. [phone]    Fax. [phone]  IWilburn Mylar, am acting as scribe for Chauncey Cruel MD.  I, Lurline Del MD, have reviewed the above documentation for accuracy and completeness, and I agree with the above.

## 2017-11-18 NOTE — Patient Instructions (Signed)
[facility] Discharge Instructions for Patients Receiving Chemotherapy  Today you received the following chemotherapy agents [place]cyla  To help prevent nausea and vomiting after your treatment, we encourage you to take your nausea medication.   If you develop nausea and vomiting that is not controlled by your nausea medication, call the clinic.   BELOW ARE SYMPTOMS THAT SHOULD BE REPORTED IMMEDIATELY:  *FEVER GREATER THAN 100.5 F  *CHILLS WITH OR WITHOUT FEVER  NAUSEA AND VOMITING THAT IS NOT CONTROLLED WITH YOUR NAUSEA MEDICATION  *UNUSUAL SHORTNESS OF BREATH  *UNUSUAL BRUISING OR BLEEDING  TENDERNESS IN MOUTH AND THROAT WITH OR WITHOUT PRESENCE OF ULCERS  *URINARY PROBLEMS  *BOWEL PROBLEMS  UNUSUAL RASH Items with * indicate a potential emergency and should be followed up as soon as possible.  Feel free to call the clinic should you have any questions or concerns. The clinic phone number is [phone].  Please show the [place] at check-in to the Emergency Department and triage nurse.

## 2017-11-19 ENCOUNTER — Encounter (INDEPENDENT_AMBULATORY_CARE_PROVIDER_SITE_OTHER): Payer: Self-pay | Admitting: Family Medicine

## 2017-11-20 ENCOUNTER — Other Ambulatory Visit (INDEPENDENT_AMBULATORY_CARE_PROVIDER_SITE_OTHER): Payer: Self-pay

## 2017-11-20 DIAGNOSIS — Z794 Long term (current) use of insulin: Secondary | ICD-10-CM

## 2017-11-20 DIAGNOSIS — E119 Type 2 diabetes mellitus without complications: Secondary | ICD-10-CM

## 2017-11-20 MED ORDER — INSULIN PEN NEEDLE 32G X 4 MM MISC: 1.0000 | Freq: Every day | 0 refills | Status: DC

## 2017-11-22 ENCOUNTER — Other Ambulatory Visit (INDEPENDENT_AMBULATORY_CARE_PROVIDER_SITE_OTHER): Payer: Self-pay | Admitting: Family Medicine

## 2017-11-22 DIAGNOSIS — Z794 Long term (current) use of insulin: Secondary | ICD-10-CM

## 2017-11-22 DIAGNOSIS — E119 Type 2 diabetes mellitus without complications: Secondary | ICD-10-CM

## 2017-11-26 ENCOUNTER — Ambulatory Visit (INDEPENDENT_AMBULATORY_CARE_PROVIDER_SITE_OTHER): Payer: Medicare Other | Admitting: Family Medicine

## 2017-11-26 VITALS — BP 133/74 | HR 68 | Ht 69.0 in | Wt 193.0 lb

## 2017-11-26 DIAGNOSIS — E1129 Type 2 diabetes mellitus with other diabetic kidney complication: Secondary | ICD-10-CM

## 2017-11-26 DIAGNOSIS — I10 Essential (primary) hypertension: Secondary | ICD-10-CM | POA: Diagnosis not present

## 2017-11-26 DIAGNOSIS — Z9889 Other specified postprocedural states: Secondary | ICD-10-CM

## 2017-11-26 DIAGNOSIS — E66811 Obesity, class 1: Secondary | ICD-10-CM

## 2017-11-26 DIAGNOSIS — F418 Other specified anxiety disorders: Secondary | ICD-10-CM

## 2017-11-26 DIAGNOSIS — Z794 Long term (current) use of insulin: Secondary | ICD-10-CM

## 2017-11-26 DIAGNOSIS — E669 Obesity, unspecified: Secondary | ICD-10-CM

## 2017-11-26 DIAGNOSIS — Z6830 Body mass index (BMI) 30.0-30.9, adult: Secondary | ICD-10-CM | POA: Diagnosis not present

## 2017-11-26 MED ORDER — METFORMIN HCL 500 MG PO TABS: 500.0000 mg | ORAL_TABLET | Freq: Every day | ORAL | 0 refills | Status: DC

## 2017-11-26 MED ORDER — BUPROPION HCL ER (SR) 200 MG PO TB12: ORAL_TABLET | ORAL | 0 refills | Status: DC

## 2017-11-26 MED ORDER — TOPIRAMATE 25 MG PO TABS: 25.0000 mg | ORAL_TABLET | Freq: Every day | ORAL | 0 refills | Status: DC

## 2017-11-26 MED ORDER — LOSARTAN POTASSIUM 100 MG PO TABS: 100.0000 mg | ORAL_TABLET | Freq: Every day | ORAL | 0 refills | Status: DC

## 2017-11-27 NOTE — Progress Notes (Signed)
Office: [phone]  /  Fax: [phone]   HPI:   Chief Complaint: OBESITY Darlene [name] is here to discuss her progress with her obesity treatment plan. She is keeping a food journal with 1300 to 1500 calories and 80+ grams of protein and is following her eating plan approximately 50 % of the time. She states she is exercising 0 minutes 0 times per week. Darlene [name] hasn't been following her plan as well. She notes an increase in stress eating with metastatic cancer to her liver. She is now concerned that she has lost weight due to cancer and not due to trying to lose weight. She denies nausea and vomiting or early satiety. Darlene [name] is status post Roux-En-Y gastric bypass surgery.  Her weight is 193 lb (87.5 kg) today and has had a weight loss of 2 pounds over a period of 3 weeks since her last visit. She has lost 32 lbs since starting treatment with [name].  Diabetes II on Insulin with Chronic Renal Failure Stage 3 Darlene [name] has a diagnosis of diabetes type II. Darlene [name] is on Basaglar 0.51mL, Victoza 1.8mg , and low dose metformin 500mg . Her GFR is >30 and her last A1c was 5.8 on 09/03/17. She denies any hypoglycemic episodes. She has been working on intensive lifestyle modifications including diet, exercise, and weight loss to help control her blood glucose levels.  Hypertension Darlene [name] is a 67 y.o. female with hypertension. Darlene [name]'s blood pressure is stable on losartan 100mg . She is working on weight loss to help control her blood pressure with the goal of decreasing her risk of heart attack and stroke. Darlene [name] denies chest pain or headache.  Depression with Anxiety Darlene [name]'s mood is still low with some increased anxiety due to liver metastasis and failure of the 1st chemotherapy treatment. She feels her Wellbutrin, Trintellix, and Topamax is helping, but she still struggles with some comfort and stress eating.  ALLERGIES: Allergies  Allergen Reactions  . Nsaids Other (See Comments)      H/o gastric bypass - avoid NSAIDs  . Tape Hives, Rash and Other (See Comments)    Adhesives in bandaids    MEDICATIONS: Current Outpatient Medications on File Prior to Visit  Medication Sig Dispense Refill  . acetaminophen (TYLENOL) 325 MG tablet Take 650 mg by mouth 2 (two) times daily as needed for moderate pain or headache.     [name] CALCIUM CITRATE-VITAMIN D PO Take 1 tablet by mouth 3 (three) times daily.     . cholecalciferol (VITAMIN D) 1000 units tablet Take 1,000 Units by mouth daily.    . Cyanocobalamin (VITAMIN B-12 PO) Take 1 tablet by mouth every 14 (fourteen) days.    . diphenhydrAMINE (BENADRYL) 25 MG tablet Take 25 mg by mouth daily as needed for allergies or sleep.     . Insulin Glargine (BASAGLAR KWIKPEN) 100 UNIT/ML SOPN INJECT 0.12 MLS (12 UNITS TOTAL) INTO THE SKIN DAILY. 5 pen 0  . Insulin Pen Needle (BD PEN NEEDLE NANO U/F) 32G X 4 MM MISC 1 Package by Does not apply route daily at 12 noon. 100 each 0  . Melatonin 3 MG CAPS Take 3 mg by mouth at bedtime.     Darlene [name] DELICA LANCETS 29J MISC 1 Package by Does not apply route daily. 100 each 0  . Pediatric Multivitamins-Iron (FLINTSTONES PLUS IRON) chewable tablet Chew 1 tablet by mouth 2 (two) times daily.    . Probiotic Product ([employer] PROBIOTIC PO) Take 1 capsule by mouth daily.     [name]  Propylene Glycol (SYSTANE BALANCE OP) Place 1-2 drops into both eyes 3 (three) times daily as needed (for dry eyes).     [name] senna-docusate (SENNA PLUS) 8.6-50 MG tablet Take 1 tablet by mouth daily as needed for moderate constipation.     [name] VICTOZA 18 MG/3ML SOPN INJECT 1.8MG (0.3 MLS) INTO THE SKIN EVERY MORNING (Patient taking differently: Inject 1.8 mg into the skin every morning. ) 3 pen 0  . vortioxetine HBr (TRINTELLIX) 20 MG TABS Take 20 mg by mouth at bedtime. 30 tablet 0   No current facility-administered medications on file prior to visit.     PAST MEDICAL HISTORY: Past Medical History:  Diagnosis Date  . Anemia    hx of -  during 1st round chemo  . Anxiety   . Arthritis    in fingers, shoulders  . Back pain   . Balance problem   . Breast cancer ([place])   . Breast cancer of upper-outer quadrant of right female breast ([place]) 07/22/2014  . Bunion, right foot   . Clumsiness   . Constipation   . Depression   . Diabetes mellitus without complication ([place])    94+ years  . Eating disorder    binge eating  . Epistaxis 08/26/2014  . Fatty liver   . HCAP (healthcare-associated pneumonia) 12/18/2014  . Headache   . Heart murmur    never had any problems  . History of radiation therapy 04/05/15-05/19/15   right chest wall was treated to 50.4 Gy in 28 fractions, right mastectomy scar was treated to 10 Gy in 5 fractions  . Hyperlipidemia   . Hypertension   . Joint pain   . Multiple food allergies   . Neuromuscular disorder ([place])    diabetic neuropathy  . Obesity   . Obstructive sleep apnea on CPAP    uses cpap nightly  . Ovarian cyst rupture    possible  . Pneumonia   . Pneumonia 11/2014  . Radiation 04/05/15-05/19/15   right chest wall 50.4 Gy, mastectomy scar 10 Gy  . Sleep apnea   . Thyroid nodule 06/2014    PAST SURGICAL HISTORY: Past Surgical History:  Procedure Laterality Date  . APPENDECTOMY    . BIOPSY THYROID Bilateral 06/2014   2 nodules - Benign   . BREAST LUMPECTOMY WITH RADIOACTIVE SEED AND SENTINEL LYMPH NODE BIOPSY Right 12/27/2014   Procedure: BREAST LUMPECTOMY WITH RADIOACTIVE SEED AND SENTINEL LYMPH NODE BIOPSY;  Surgeon: Stark Klein, MD;  Location: [facility];  Service: General;  Laterality: Right;  . BREAST REDUCTION SURGERY Bilateral 01/06/2015   Procedure: BILATERAL BREAST REDUCTION, RIGHT ONCOPLASTIC RECONSTRUCTION),LEFT BREAST REDUCTION FOR SYMMETRY;  Surgeon: [name], MD;  Location: [facility];  Service: Plastics;  Laterality: Bilateral;  . BREATH TEK H PYLORI N/A 09/15/2012   Procedure: BREATH TEK H PYLORI;  Surgeon: Pedro Earls, MD;  Location: [facility];   Service: General;  Laterality: N/A;  . BUNIONECTOMY Left 12/2013  . CARPAL TUNNEL RELEASE Right   . CARPAL TUNNEL RELEASE Left   . CATARACT EXTRACTION     6/19  . COLONOSCOPY    . DUPUYTREN CONTRACTURE RELEASE    . GASTRIC ROUX-EN-Y N/A 11/02/2012   Procedure: LAPAROSCOPIC ROUX-EN-Y GASTRIC;  Surgeon: Pedro Earls, MD;  Location: [facility];  Service: General;  Laterality: N/A;  . IR GENERIC HISTORICAL  03/18/2016   IR US GUIDE VASC ACCESS LEFT 03/18/2016 Markus Daft, MD [facility]  . IR GENERIC HISTORICAL  03/18/2016  IR FLUORO GUIDE CV LINE LEFT 03/18/2016 Markus Daft, MD [facility]  . LAPAROSCOPIC APPENDECTOMY N/A 07/22/2015   Procedure: APPENDECTOMY LAPAROSCOPIC;  Surgeon: Michael Boston, MD;  Location: [facility];  Service: General;  Laterality: N/A;  . MASTECTOMY Right 02/15/2015   modified  . MODIFIED MASTECTOMY Right 02/15/2015   Procedure: RIGHT MODIFIED RADICAL MASTECTOMY;  Surgeon: Stark Klein, MD;  Location: [facility];  Service: General;  Laterality: Right;  . PORT-A-CATH REMOVAL Left 10/10/2015   Procedure: PORT REMOVAL;  Surgeon: Stark Klein, MD;  Location: [facility];  Service: General;  Laterality: Left;  . PORTACATH PLACEMENT Left 08/02/2014   Procedure: INSERTION PORT-A-CATH;  Surgeon: Stark Klein, MD;  Location: [facility];  Service: General;  Laterality: Left;  . PORTACATH PLACEMENT N/A 11/05/2016   Procedure: INSERTION PORT-A-CATH;  Surgeon: Stark Klein, MD;  Location: [facility];  Service: General;  Laterality: N/A;  . ROTATOR CUFF REPAIR Right 2005  . SCAR REVISION Right 12/08/2015   Procedure: COMPLEX REPAIR RIGHT CHEST 10-15CM;  Surgeon: [name], MD;  Location: [facility];  Service: Plastics;  Laterality: Right;  . TOE SURGERY  1970s   bone spur  . TRIGGER FINGER RELEASE     x3    SOCIAL HISTORY: Social History   Tobacco Use  . Smoking status: Never Smoker  . Smokeless tobacco: Never Used  Substance Use Topics  . Alcohol use: Yes     Alcohol/week: 1.0 standard drinks    Types: 1 Standard drinks or equivalent per week    Comment: social  . Drug use: No    FAMILY HISTORY: Family History  Problem Relation Age of Onset  . CVA Mother   . Heart disease Mother   . Sudden death Mother   . Diabetes Father   . Heart failure Father   . Hypertension Father   . Kidney disease Father   . Obesity Father   . Hypertension Sister   . Diabetes Brother   . Breast cancer Paternal Grandmother 70  . Diabetes Paternal Uncle   . Ovarian cancer Neg Hx     ROS: Review of Systems  Constitutional: Positive for weight loss.  Cardiovascular: Negative for chest pain.  Gastrointestinal: Negative for nausea and vomiting.  Neurological: Negative for headaches.  Endo/Heme/Allergies:       Negative for hypoglycemia.  Psychiatric/Behavioral: Positive for depression.    PHYSICAL EXAM: Blood pressure 133/74, pulse 68, height 5\' 9"  (1.753 m), weight 193 lb (87.5 kg), last menstrual period 10/22/2007, SpO2 100 %. Body mass index is 28.5 kg/m. Physical Exam  Constitutional: She is oriented to person, place, and time. She appears well-developed and well-nourished.  Cardiovascular: Normal rate.  Pulmonary/Chest: Effort normal.  Musculoskeletal: Normal range of motion.  Neurological: She is oriented to person, place, and time.  Skin: Skin is warm and dry.  Psychiatric: She has a normal mood and affect. Her behavior is normal.  Vitals reviewed.   RECENT LABS AND TESTS: BMET    Component Value Date/Time   NA 144 11/10/2017 1305   NA 143 03/11/2017 0841   NA 140 01/07/2017 0758   K 4.4 11/10/2017 1305   K 4.1 01/07/2017 0758   CL 110 11/10/2017 1305   CO2 25 11/10/2017 1305   CO2 24 01/07/2017 0758   GLUCOSE 86 11/10/2017 1305   GLUCOSE 134 01/07/2017 0758   BUN 26 (H) 11/10/2017 1305   BUN 40 (H) 03/11/2017 0841   BUN 33.6 (H) 01/07/2017 1093  CREATININE 1.50 (H) 11/10/2017 1305   CREATININE 1.5 (H) 01/07/2017 0758    CALCIUM 9.9 11/10/2017 1305   CALCIUM 9.3 01/07/2017 0758   GFRNONAA 35 (L) 11/10/2017 1305   GFRNONAA 55 (L) 02/18/2013 0827   GFRAA 41 (L) 11/10/2017 1305   GFRAA 63 02/18/2013 0827   Lab Results  Component Value Date   HGBA1C 5.8 (H) 09/03/2017   HGBA1C 6.1 (H) 03/11/2017   HGBA1C 8.2 (H) 11/05/2016   HGBA1C 8.1 (H) 08/09/2016   HGBA1C 6.5 (H) 01/03/2015   Lab Results  Component Value Date   INSULIN 15.3 03/11/2017   INSULIN 18.3 08/09/2016   CBC    Component Value Date/Time   WBC 4.6 11/10/2017 1305   WBC 3.9 (L) 11/03/2017 0631   RBC 3.51 (L) 11/10/2017 1305   HGB 10.8 (L) 11/10/2017 1305   HGB 10.8 (L) 01/07/2017 0758   HCT 33.0 (L) 11/10/2017 1305   HCT 31.9 (L) 01/07/2017 0758   PLT 78 (L) 11/10/2017 1305   PLT 126 (L) 01/07/2017 0758   MCV 94.0 11/10/2017 1305   MCV 90.8 01/07/2017 0758   MCH 30.8 11/10/2017 1305   MCHC 32.7 11/10/2017 1305   RDW 13.7 11/10/2017 1305   RDW 14.8 (H) 01/07/2017 0758   LYMPHSABS 1.2 11/10/2017 1305   LYMPHSABS 1.1 01/07/2017 0758   MONOABS 0.4 11/10/2017 1305   MONOABS 0.3 01/07/2017 0758   EOSABS 0.1 11/10/2017 1305   EOSABS 0.1 01/07/2017 0758   EOSABS 0.1 08/09/2016 1229   BASOSABS 0.0 11/10/2017 1305   BASOSABS 0.0 01/07/2017 0758   Iron/TIBC/Ferritin/ %Sat    Component Value Date/Time   IRON 58 02/18/2013 0827   TIBC 305 02/18/2013 0827   FERRITIN 133 10/31/2014 0816   IRONPCTSAT 19 (L) 02/18/2013 0827   Lipid Panel     Component Value Date/Time   CHOL 160 09/03/2017 1349   TRIG 111 09/03/2017 1349   HDL 59 09/03/2017 1349   CHOLHDL 2.9 02/18/2013 0827   VLDL 23 02/18/2013 0827   LDLCALC 79 09/03/2017 1349   Hepatic Function Panel     Component Value Date/Time   PROT 7.2 11/10/2017 1305   PROT 6.9 03/11/2017 0841   PROT 6.7 01/07/2017 0758   ALBUMIN 3.9 11/10/2017 1305   ALBUMIN 4.5 03/11/2017 0841   ALBUMIN 3.6 01/07/2017 0758   AST 66 (H) 11/10/2017 1305   AST 51 (H) 01/07/2017 0758   ALT 74  (H) 11/10/2017 1305   ALT 82 (H) 01/07/2017 0758   ALKPHOS 169 (H) 11/10/2017 1305   ALKPHOS 115 01/07/2017 0758   BILITOT 0.7 11/10/2017 1305   BILITOT 0.72 01/07/2017 0758      Component Value Date/Time   TSH 1.54 02/07/2017   TSH 2.170 08/09/2016 1229   Results for Nomura, Abbe L "[name]" (MRN [id number]) as of 11/27/2017 10:34  Ref. Range 09/03/2017 13:49  Vitamin D, 25-Hydroxy Latest Ref Range: 30.0 - 100.0 ng/mL 58.3   ASSESSMENT AND PLAN: Essential hypertension - Plan: losartan (COZAAR) 100 MG tablet  Type 2 diabetes mellitus with other diabetic kidney complication, with long-term current use of insulin (HCC) - Plan: metFORMIN (GLUCOPHAGE) 500 MG tablet  Depression with anxiety - Plan: buPROPion (WELLBUTRIN SR) 200 MG 12 hr tablet, topiramate (TOPAMAX) 25 MG tablet  Status post gastric surgery  Class 1 obesity with serious comorbidity and body mass index (BMI) of 30.0 to 30.9 in adult, unspecified obesity type - Starting BMI greater then 30  PLAN:  Diabetes II on Insulin  with Chronic Renal Failure Stage 3 Avayah has been given extensive diabetes education by myself today including ideal fasting and post-prandial blood glucose readings, individual ideal Hgb A1c goals, and hypoglycemia prevention. We discussed the importance of good blood sugar control to decrease the likelihood of diabetic complications such as nephropathy, neuropathy, limb loss, blindness, coronary artery disease, and death. We discussed the importance of intensive lifestyle modification including diet, exercise and weight loss as the first line treatment for diabetes. 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  Hypertension We discussed sodium restriction, working on healthy weight loss, and a regular exercise program as the means to achieve improved blood pressure control. We will continue to  monitor her blood pressure as well as her progress with the above lifestyle modifications. She will continue her losartan 100mg  qd #30 with no refills and will watch for signs of hypotension as she continues her lifestyle modifications. Darlene [name] agreed with this plan and agreed to follow up as directed.  Depression with Anxiety We discussed behavior modification techniques today to help Darlene [name] deal with her emotional eating and depression. She has agreed to continue to take Wellbutrin SR 200mg  qd #30 with no refills and Topamax 25mg  qd #30 with no refills. Darlene [name] was offered support and comfort. Metastatic cancer group meetings was suggested. She agreed to follow up as directed in 2 weeks.  Obesity Darlene [name] is currently in the action stage of change. As such, her goal is to continue with weight loss efforts. I reassured Darlene [name] that her small metastasis is unlikely to be the cause of her weight loss, especially since her appetite is normal. She was encouraged to continue to eat healthy with increased vegetables, fruits, and lean protein. She has agreed to keep a food journal with 1300 to 1500 calories and 80+ grams of protein. Darlene [name] has been instructed to work up to a goal of 150 minutes of combined cardio and strengthening exercise per week for weight loss and overall health benefits. We discussed the following Behavioral Modification Strategies today: increasing lean protein intake and decreasing simple carbohydrates.   Darlene [name] has agreed to follow up with our clinic in 2 weeks. She was informed of the importance of frequent follow up visits to maximize her success with intensive lifestyle modifications for her multiple health conditions.   OBESITY BEHAVIORAL INTERVENTION VISIT  Today's visit was # 27  Starting weight: 225 lbs Starting date: 08/09/16 Today's weight : Weight: 193 lb (87.5 kg)  Today's date: 11/26/2017 Total lbs lost to date: 32 At least 15 minutes were spent on  discussing the following behavioral intervention visit.  ASK: We discussed the diagnosis of obesity with Darlene [name] today and Darlene [name] agreed to give [name] permission to discuss obesity behavioral modification therapy today.  ASSESS: Rael has the diagnosis of obesity and her BMI today is 28.49. Faith is in the action stage of change.   ADVISE: Che was educated on the multiple health risks of obesity as well as the benefit of weight loss to improve her health. She was advised of the need for long term treatment and the importance of lifestyle modifications to improve her current health and to decrease her risk of future health problems.  AGREE: Multiple dietary modification options and treatment options were discussed and Almendra agreed to follow the recommendations documented in the above note.  ARRANGE: Mata was educated on  the importance of frequent visits to treat obesity as outlined per CMS and USPSTF guidelines and agreed to schedule her next follow up appointment today.  I, Marcille Blanco, am acting as transcriptionist for Starlyn Skeans, MD  I have reviewed the above documentation for accuracy and completeness, and I agree with the above. -Dennard Nip, MD

## 2017-12-01 ENCOUNTER — Other Ambulatory Visit: Payer: Self-pay | Admitting: Oncology

## 2017-12-01 ENCOUNTER — Encounter (INDEPENDENT_AMBULATORY_CARE_PROVIDER_SITE_OTHER): Payer: Medicare Other | Admitting: Family Medicine

## 2017-12-01 DIAGNOSIS — C787 Secondary malignant neoplasm of liver and intrahepatic bile duct: Secondary | ICD-10-CM

## 2017-12-01 DIAGNOSIS — C801 Malignant (primary) neoplasm, unspecified: Secondary | ICD-10-CM | POA: Insufficient documentation

## 2017-12-01 NOTE — Progress Notes (Signed)
I heard from Dr. Collene Mares that work-up for primary GI malignancy on Darlene [name] was negative.  She did have a hyperplastic polyp at the GE junction.  There was no Helicobacter.  I sent Dr. Carman Ching and note requesting his opinion regarding possible yttrium therapy or other form of ablation.  I also am writing for a PET scan to see if we can identify a primary.  I called Darlene [name] and let her know of the plan.

## 2017-12-03 ENCOUNTER — Other Ambulatory Visit (INDEPENDENT_AMBULATORY_CARE_PROVIDER_SITE_OTHER): Payer: Self-pay | Admitting: Family Medicine

## 2017-12-03 DIAGNOSIS — E119 Type 2 diabetes mellitus without complications: Secondary | ICD-10-CM

## 2017-12-03 DIAGNOSIS — Z794 Long term (current) use of insulin: Secondary | ICD-10-CM

## 2017-12-04 NOTE — Progress Notes (Signed)
error 

## 2017-12-09 ENCOUNTER — Ambulatory Visit (HOSPITAL_COMMUNITY)
Admission: RE | Admit: 2017-12-09 | Discharge: 2017-12-09 | Disposition: A | Discharge status: Home / Self Care | Payer: Medicare Other | Source: Ambulatory Visit | Attending: Oncology | Admitting: Oncology

## 2017-12-09 DIAGNOSIS — C787 Secondary malignant neoplasm of liver and intrahepatic bile duct: Secondary | ICD-10-CM | POA: Diagnosis not present

## 2017-12-09 DIAGNOSIS — C801 Malignant (primary) neoplasm, unspecified: Secondary | ICD-10-CM | POA: Diagnosis present

## 2017-12-09 LAB — GLUCOSE, CAPILLARY: Glucose-Capillary: 81 mg/dL (ref 70–99)

## 2017-12-09 MED ORDER — FLUDEOXYGLUCOSE F - 18 (FDG) INJECTION
9.6000 | Freq: Once | INTRAVENOUS | Status: AC | PRN
  Administered 2017-12-09: 9.6 via INTRAVENOUS

## 2017-12-11 ENCOUNTER — Other Ambulatory Visit (INDEPENDENT_AMBULATORY_CARE_PROVIDER_SITE_OTHER): Payer: Self-pay

## 2017-12-11 ENCOUNTER — Encounter (INDEPENDENT_AMBULATORY_CARE_PROVIDER_SITE_OTHER): Payer: Self-pay | Admitting: Family Medicine

## 2017-12-11 DIAGNOSIS — Z794 Long term (current) use of insulin: Secondary | ICD-10-CM

## 2017-12-11 DIAGNOSIS — E1129 Type 2 diabetes mellitus with other diabetic kidney complication: Secondary | ICD-10-CM

## 2017-12-11 MED ORDER — METFORMIN HCL 500 MG PO TABS: 500.0000 mg | ORAL_TABLET | Freq: Every day | ORAL | 0 refills | Status: DC

## 2017-12-16 ENCOUNTER — Inpatient Hospital Stay: Payer: Medicare Other

## 2017-12-16 ENCOUNTER — Telehealth: Payer: Self-pay | Admitting: Adult Health

## 2017-12-16 ENCOUNTER — Inpatient Hospital Stay: Payer: Medicare Other | Attending: Oncology | Admitting: Adult Health

## 2017-12-16 VITALS — BP 116/57 | HR 71 | Temp 97.7°F | Resp 18 | Ht 69.0 in | Wt 198.6 lb

## 2017-12-16 DIAGNOSIS — C50911 Malignant neoplasm of unspecified site of right female breast: Secondary | ICD-10-CM

## 2017-12-16 DIAGNOSIS — Z171 Estrogen receptor negative status [ER-]: Secondary | ICD-10-CM

## 2017-12-16 DIAGNOSIS — C50411 Malignant neoplasm of upper-outer quadrant of right female breast: Secondary | ICD-10-CM | POA: Insufficient documentation

## 2017-12-16 DIAGNOSIS — C801 Malignant (primary) neoplasm, unspecified: Secondary | ICD-10-CM

## 2017-12-16 DIAGNOSIS — Z95828 Presence of other vascular implants and grafts: Secondary | ICD-10-CM

## 2017-12-16 DIAGNOSIS — C773 Secondary and unspecified malignant neoplasm of axilla and upper limb lymph nodes: Secondary | ICD-10-CM | POA: Diagnosis not present

## 2017-12-16 DIAGNOSIS — Z5112 Encounter for antineoplastic immunotherapy: Secondary | ICD-10-CM | POA: Insufficient documentation

## 2017-12-16 DIAGNOSIS — C787 Secondary malignant neoplasm of liver and intrahepatic bile duct: Secondary | ICD-10-CM

## 2017-12-16 LAB — CMP (CANCER CENTER ONLY)
ALT: 57 U/L — ABNORMAL HIGH (ref 0–44)
AST: 43 U/L — ABNORMAL HIGH (ref 15–41)
Albumin: 3.7 g/dL (ref 3.5–5.0)
Alkaline Phosphatase: 154 U/L — ABNORMAL HIGH (ref 38–126)
Anion gap: 7 (ref 5–15)
BUN: 28 mg/dL — ABNORMAL HIGH (ref 8–23)
CO2: 23 mmol/L (ref 22–32)
Calcium: 9.1 mg/dL (ref 8.9–10.3)
Chloride: 114 mmol/L — ABNORMAL HIGH (ref 98–111)
Creatinine: 1.74 mg/dL — ABNORMAL HIGH (ref 0.44–1.00)
GFR, Est AFR Am: 35 mL/min — ABNORMAL LOW (ref >60)
GFR, Estimated: 30 mL/min — ABNORMAL LOW (ref >60)
Glucose, Bld: 136 mg/dL — ABNORMAL HIGH (ref 70–99)
Potassium: 4.2 mmol/L (ref 3.5–5.1)
Sodium: 144 mmol/L (ref 135–145)
Total Bilirubin: 0.6 mg/dL (ref 0.3–1.2)
Total Protein: 6.5 g/dL (ref 6.5–8.1)

## 2017-12-16 LAB — CBC WITH DIFFERENTIAL (CANCER CENTER ONLY)
Abs Immature Granulocytes: 0.02 10*3/uL (ref 0.00–0.07)
Basophils Absolute: 0 10*3/uL (ref 0.0–0.1)
Basophils Relative: 0 %
Eosinophils Absolute: 0.1 10*3/uL (ref 0.0–0.5)
Eosinophils Relative: 3 %
HCT: 32.4 % — ABNORMAL LOW (ref 36.0–46.0)
Hemoglobin: 10.6 g/dL — ABNORMAL LOW (ref 12.0–15.0)
Immature Granulocytes: 1 %
Lymphocytes Relative: 23 %
Lymphs Abs: 1 10*3/uL (ref 0.7–4.0)
MCH: 30.1 pg (ref 26.0–34.0)
MCHC: 32.7 g/dL (ref 30.0–36.0)
MCV: 92 fL (ref 80.0–100.0)
Monocytes Absolute: 0.3 10*3/uL (ref 0.1–1.0)
Monocytes Relative: 8 %
Neutro Abs: 2.7 10*3/uL (ref 1.7–7.7)
Neutrophils Relative %: 65 %
Platelet Count: 83 10*3/uL — ABNORMAL LOW (ref 150–400)
RBC: 3.52 MIL/uL — ABNORMAL LOW (ref 3.87–5.11)
RDW: 13.7 % (ref 11.5–15.5)
WBC Count: 4.2 10*3/uL (ref 4.0–10.5)
nRBC: 0 % (ref 0.0–0.2)

## 2017-12-16 MED ORDER — HEPARIN SOD (PORK) LOCK FLUSH 100 UNIT/ML IV SOLN
500.0000 [IU] | Freq: Once | INTRAVENOUS | Status: DC | PRN
  Filled 2017-12-16: qty 5

## 2017-12-16 MED ORDER — ACETAMINOPHEN 325 MG PO TABS
650.0000 mg | ORAL_TABLET | Freq: Once | ORAL | Status: AC
  Administered 2017-12-16: 650 mg via ORAL

## 2017-12-16 MED ORDER — DIPHENHYDRAMINE HCL 25 MG PO CAPS
25.0000 mg | ORAL_CAPSULE | Freq: Once | ORAL | Status: AC
  Administered 2017-12-16: 25 mg via ORAL

## 2017-12-16 MED ORDER — SODIUM CHLORIDE 0.9% FLUSH
10.0000 mL | INTRAVENOUS | Status: DC | PRN
  Filled 2017-12-16: qty 10

## 2017-12-16 MED ORDER — SODIUM CHLORIDE 0.9 % IV SOLN
Freq: Once | INTRAVENOUS | Status: AC
  Administered 2017-12-16: 10:00:00 via INTRAVENOUS
  Filled 2017-12-16: qty 250

## 2017-12-16 MED ORDER — DIPHENHYDRAMINE HCL 25 MG PO CAPS
ORAL_CAPSULE | ORAL | Status: AC
  Filled 2017-12-16: qty 1

## 2017-12-16 MED ORDER — TRASTUZUMAB CHEMO 150 MG IV SOLR
6.0000 mg/kg | Freq: Once | INTRAVENOUS | Status: AC
  Administered 2017-12-16: 546 mg via INTRAVENOUS
  Filled 2017-12-16: qty 26

## 2017-12-16 MED ORDER — SODIUM CHLORIDE 0.9% FLUSH
10.0000 mL | INTRAVENOUS | Status: DC | PRN
  Administered 2017-12-16: 10 mL via INTRAVENOUS
  Filled 2017-12-16: qty 10

## 2017-12-16 MED ORDER — ACETAMINOPHEN 325 MG PO TABS
ORAL_TABLET | ORAL | Status: AC
  Filled 2017-12-16: qty 2

## 2017-12-16 MED ORDER — SODIUM CHLORIDE 0.9 % IJ SOLN
10.0000 mL | INTRAMUSCULAR | Status: DC | PRN
  Filled 2017-12-16: qty 10

## 2017-12-16 NOTE — Patient Instructions (Addendum)
[facility] Discharge Instructions for Patients Receiving Chemotherapy  Today you received the following chemotherapy agents Herceptin.  To help prevent nausea and vomiting after your treatment, we encourage you to take your nausea medication.   If you develop nausea and vomiting that is not controlled by your nausea medication, call the clinic.   BELOW ARE SYMPTOMS THAT SHOULD BE REPORTED IMMEDIATELY:  *FEVER GREATER THAN 100.5 F  *CHILLS WITH OR WITHOUT FEVER  NAUSEA AND VOMITING THAT IS NOT CONTROLLED WITH YOUR NAUSEA MEDICATION  *UNUSUAL SHORTNESS OF BREATH  *UNUSUAL BRUISING OR BLEEDING  TENDERNESS IN MOUTH AND THROAT WITH OR WITHOUT PRESENCE OF ULCERS  *URINARY PROBLEMS  *BOWEL PROBLEMS  UNUSUAL RASH Items with * indicate a potential emergency and should be followed up as soon as possible.  Feel free to call the clinic should you have any questions or concerns. The clinic phone number is [phone].  Please show the [facility] at check-in to the Emergency Department and triage nurse.   Trastuzumab injection for infusion What is this medicine? TRASTUZUMAB (tras TOO zoo mab) is a monoclonal antibody. It is used to treat breast cancer and stomach cancer. This medicine may be used for other purposes; ask your health care provider or pharmacist if you have questions. COMMON BRAND NAME(S): Herceptin What should I tell my health care provider before I take this medicine? They need to know if you have any of these conditions: -heart disease -heart failure -lung or breathing disease, like asthma -an unusual or allergic reaction to trastuzumab, benzyl alcohol, or other medications, foods, dyes, or preservatives -pregnant or trying to get pregnant -breast-feeding How should I use this medicine? This drug is given as an infusion into a vein. It is administered in a hospital or clinic by a specially trained health care professional. Talk to your  pediatrician regarding the use of this medicine in children. This medicine is not approved for use in children. Overdosage: If you think you have taken too much of this medicine contact a poison control center or emergency room at once. NOTE: This medicine is only for you. Do not share this medicine with others. What if I miss a dose? It is important not to miss a dose. Call your doctor or health care professional if you are unable to keep an appointment. What may interact with this medicine? This medicine may interact with the following medications: -certain types of chemotherapy, such as daunorubicin, doxorubicin, epirubicin, and idarubicin This list may not describe all possible interactions. Give your health care provider a list of all the medicines, herbs, non-prescription drugs, or dietary supplements you use. Also tell them if you smoke, drink alcohol, or use illegal drugs. Some items may interact with your medicine. What should I watch for while using this medicine? Visit your doctor for checks on your progress. Report any side effects. Continue your course of treatment even though you feel ill unless your doctor tells you to stop. Call your doctor or health care professional for advice if you get a fever, chills or sore throat, or other symptoms of a cold or flu. Do not treat yourself. Try to avoid being around people who are sick. You may experience fever, chills and shaking during your first infusion. These effects are usually mild and can be treated with other medicines. Report any side effects during the infusion to your health care professional. Fever and chills usually do not happen with later infusions. Do not become pregnant while taking  this medicine or for 7 months after stopping it. Women should inform their doctor if they wish to become pregnant or think they might be pregnant. Women of child-bearing potential will need to have a negative pregnancy test before starting this  medicine. There is a potential for serious side effects to an unborn child. Talk to your health care professional or pharmacist for more information. Do not breast-feed an infant while taking this medicine or for 7 months after stopping it. Women must use effective birth control with this medicine. What side effects may I notice from receiving this medicine? Side effects that you should report to your doctor or health care professional as soon as possible: -allergic reactions like skin rash, itching or hives, swelling of the face, lips, or tongue -chest pain or palpitations -cough -dizziness -feeling faint or lightheaded, falls -fever -general ill feeling or flu-like symptoms -signs of worsening heart failure like breathing problems; swelling in your legs and feet -unusually weak or tired Side effects that usually do not require medical attention (report to your doctor or health care professional if they continue or are bothersome): -bone pain -changes in taste -diarrhea -joint pain -nausea/vomiting -weight loss This list may not describe all possible side effects. Call your doctor for medical advice about side effects. You may report side effects to FDA at 1-800-FDA-1088. Where should I keep my medicine? This drug is given in a hospital or clinic and will not be stored at home. NOTE: This sheet is a summary. It may not cover all possible information. If you have questions about this medicine, talk to your doctor, pharmacist, or health care provider.  2018 [employer] (2016-01-02 14:37:52)

## 2017-12-16 NOTE — Telephone Encounter (Signed)
Gave patient avs and calendar.  Port flush added for 12/24 appt after the fact; took spot due to availability.

## 2017-12-16 NOTE — Progress Notes (Addendum)
[place]  [phone] [phone]   ID: Darlene [name] DOB: 09-05-1950  MR#: [id number]  ZPH#:[id number]  Patient Care Team: Vernie Shanks, MD as PCP - General (Family Medicine) Himmelrich, Bryson Ha, RD (Inactive) as Dietitian (Bariatrics) Stark Klein, MD as Consulting Physician (General Surgery) Magrinat, Virgie Dad, MD as Consulting Physician (Oncology) Arloa Koh, MD as Consulting Physician (Radiation Oncology) Mauro Kaufmann, RN as Registered Nurse Rockwell Germany, RN as Registered Nurse Jacelyn Pi, MD as Consulting Physician (Endocrinology) Kem Boroughs, [place] as Nurse Practitioner (Family Medicine) [name], MD as Consulting Physician (Plastic Surgery) Larey Dresser, MD as Consulting Physician (Cardiology) Gery Pray, MD as Consulting Physician (Radiation Oncology) Juanita Craver, MD as Consulting Physician (Gastroenterology) PCP: Vernie Shanks, MD OTHER MD:  CHIEF COMPLAINT:  HER-2 positive, estrogen receptor negative locally recurrent disease  CURRENT TREATMENT:  trastuzumab  INTERVAL HISTORY:  Darlene [name] returns today for follow-up and treatment of her estrogen receptor negative but HER-2 amplified breast cancer accompanied by her husband. She is here to review liver biopsy results.  Since her last visit, she completed an abdominal ultrasound on 11/03/2017 showing: Limited visualization of right lobe liver lesion.  She underwent biopsy of the liver lesion on 11/03/2017 with pathology showing ([id number]): Adenocarcinoma most consistent with an upper gastrointestinal or pancreatobiliary primary. HER-2 testing was negative (recall her breast cancer has been estrogen receptor negative but HER-2 positive)   Darlene [name] underwent EGD and colonoscopy with Dr. Collene Mares at the beginning of November. Per Dr. Jana Hakim note from 12/01/2017, "I heard from Dr. Collene Mares that work-up for primary GI malignancy on Darlene [name] was negative.  She did  have a hyperplastic polyp at the GE junction.  There was no Helicobacter.  I sent Dr. Carman Ching and note requesting his opinion regarding possible yttrium therapy or other form of ablation.  I also am writing for a PET scan to see if we can identify a primary.  I called Darlene [name] and let her know of the plan."  She had tumor markers drawn last month, a CA 19-9 was normal, CA 27-29 was normal, and CEA was 6.72 (elevated).  REVIEW OF SYSTEMS: Darlene [name] says she feels fine.  She says she is eating and drinking well.  She is not in any pain.  She is not sleeping well and notes that she thinks it could be stress.  She is drinking more water at night and does urinate during the night as well.   She denies any unusual headaches or vision changes.  She has mild baseline peripheral neuropathy in her feet from her diabetes.  She denies any nausea, vomiting, bowel/bladder changes.  She is without chest pain, palpitations, cough or shortness of breath.  Otherwise a detailed ROS was non contributory.    BREAST CANCER HISTORY: From the original intake note:  "Darlene [name]" had screening mammography at her gynecologist suggestive of a possible abnormality in the right breast. However right diagnostic mammogram 04/12/2013 at [facility] was negative. More recently however, she noted a lump in her right breast and brought it to her gynecologist's attention. On 07/18/2014 the patient had bilateral diagnostic mammography with tomosynthesis at [facility]. The breast density was category B. In the right breast upper outer quadrant there was an area of focal asymmetry with indistinct margins.[name] Ultrasound was obtained and showed in addition to the mass in question, measuring 1.7 cm, an abnormal-appearing lymph node in the right axillary tail.  On 07/19/2014 the patient underwent biopsy of the right  breast massin question as well as a right axillary lymph node. Both were positive for invasive ductal carcinoma, grade 2, estrogen and progesterone receptor  negative, with an MIB-1 of 90%, and HER-2 pending. Incidentally the lymph node biopsy showed a lymphocytic inflammatory component but no lymph node architecture.  Her subsequent history is as detailed below.  PAST MEDICAL HISTORY: Past Medical History:  Diagnosis Date  . Anemia    hx of - during 1st round chemo  . Anxiety   . Arthritis    in fingers, shoulders  . Back pain   . Balance problem   . Breast cancer ([place])   . Breast cancer of upper-outer quadrant of right female breast ([place]) 07/22/2014  . Bunion, right foot   . Clumsiness   . Constipation   . Depression   . Diabetes mellitus without complication ([place])    28+ years  . Eating disorder    binge eating  . Epistaxis 08/26/2014  . Fatty liver   . HCAP (healthcare-associated pneumonia) 12/18/2014  . Headache   . Heart murmur    never had any problems  . History of radiation therapy 04/05/15-05/19/15   right chest wall was treated to 50.4 Gy in 28 fractions, right mastectomy scar was treated to 10 Gy in 5 fractions  . Hyperlipidemia   . Hypertension   . Joint pain   . Multiple food allergies   . Neuromuscular disorder ([place])    diabetic neuropathy  . Obesity   . Obstructive sleep apnea on CPAP    uses cpap nightly  . Ovarian cyst rupture    possible  . Pneumonia   . Pneumonia 11/2014  . Radiation 04/05/15-05/19/15   right chest wall 50.4 Gy, mastectomy scar 10 Gy  . Sleep apnea   . Thyroid nodule 06/2014    PAST SURGICAL HISTORY: Past Surgical History:  Procedure Laterality Date  . APPENDECTOMY    . BIOPSY THYROID Bilateral 06/2014   2 nodules - Benign   . BREAST LUMPECTOMY WITH RADIOACTIVE SEED AND SENTINEL LYMPH NODE BIOPSY Right 12/27/2014   Procedure: BREAST LUMPECTOMY WITH RADIOACTIVE SEED AND SENTINEL LYMPH NODE BIOPSY;  Surgeon: Stark Klein, MD;  Location: [facility];  Service: General;  Laterality: Right;  . BREAST REDUCTION SURGERY Bilateral 01/06/2015   Procedure: BILATERAL BREAST  REDUCTION, RIGHT ONCOPLASTIC RECONSTRUCTION),LEFT BREAST REDUCTION FOR SYMMETRY;  Surgeon: [name], MD;  Location: [facility];  Service: Plastics;  Laterality: Bilateral;  . BREATH TEK H PYLORI N/A 09/15/2012   Procedure: BREATH TEK H PYLORI;  Surgeon: Pedro Earls, MD;  Location: [facility];  Service: General;  Laterality: N/A;  . BUNIONECTOMY Left 12/2013  . CARPAL TUNNEL RELEASE Right   . CARPAL TUNNEL RELEASE Left   . CATARACT EXTRACTION     6/19  . COLONOSCOPY    . DUPUYTREN CONTRACTURE RELEASE    . GASTRIC ROUX-EN-Y N/A 11/02/2012   Procedure: LAPAROSCOPIC ROUX-EN-Y GASTRIC;  Surgeon: Pedro Earls, MD;  Location: [facility];  Service: General;  Laterality: N/A;  . IR GENERIC HISTORICAL  03/18/2016   IR US GUIDE VASC ACCESS LEFT 03/18/2016 Markus Daft, MD [facility]  . IR GENERIC HISTORICAL  03/18/2016   IR FLUORO GUIDE CV LINE LEFT 03/18/2016 Markus Daft, MD [facility]  . LAPAROSCOPIC APPENDECTOMY N/A 07/22/2015   Procedure: APPENDECTOMY LAPAROSCOPIC;  Surgeon: Michael Boston, MD;  Location: [facility];  Service: General;  Laterality: N/A;  . MASTECTOMY Right 02/15/2015   modified  . MODIFIED MASTECTOMY  Right 02/15/2015   Procedure: RIGHT MODIFIED RADICAL MASTECTOMY;  Surgeon: Stark Klein, MD;  Location: [place];  Service: General;  Laterality: Right;  . PORT-A-CATH REMOVAL Left 10/10/2015   Procedure: PORT REMOVAL;  Surgeon: Stark Klein, MD;  Location: [place];  Service: General;  Laterality: Left;  . PORTACATH PLACEMENT Left 08/02/2014   Procedure: INSERTION PORT-A-CATH;  Surgeon: Stark Klein, MD;  Location: [place];  Service: General;  Laterality: Left;  . PORTACATH PLACEMENT N/A 11/05/2016   Procedure: INSERTION PORT-A-CATH;  Surgeon: Stark Klein, MD;  Location: [place];  Service: General;  Laterality: N/A;  . ROTATOR CUFF REPAIR Right 2005  . SCAR REVISION Right 12/08/2015   Procedure: COMPLEX REPAIR RIGHT CHEST 10-15CM;  Surgeon: [name], MD;  Location:  [place];  Service: Plastics;  Laterality: Right;  . TOE SURGERY  1970s   bone spur  . TRIGGER FINGER RELEASE     x3    FAMILY HISTORY Family History  Problem Relation Age of Onset  . CVA Mother   . Heart disease Mother   . Sudden death Mother   . Diabetes Father   . Heart failure Father   . Hypertension Father   . Kidney disease Father   . Obesity Father   . Hypertension Sister   . Diabetes Brother   . Breast cancer Paternal Grandmother 61  . Diabetes Paternal Uncle   . Ovarian cancer Neg Hx    The patient's father died from complications of diabetes at age 22. The patient's mother died at age 17 with a subarachnoid hemorrhage. The patient had one brother, one sister. The only breast cancer was the patient's paternal grandmother diagnosed in her 82s. There is no history of ovarian cancer in the family.  GYNECOLOGIC HISTORY:  Patient's last menstrual period was 10/22/2007.  menarche age 65, the patient is GX P0. She went through the change of life in 2011. She did not take hormone replacement.  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 Her husband Darlene [name] is a retired Visual merchandiser (he taught at [employer]).  Darlene [name] has 2 children from an earlier marriage, Rockey Situ who teaches in [place], and Elianie Hubers,  who works at the [employer] in in [place]. He has 1 grand son, aged 70. The patient and her husband are not church attenders.   ADVANCED DIRECTIVES:  Not in place   HEALTH MAINTENANCE: Social History   Tobacco Use  . Smoking status: Never Smoker  . Smokeless tobacco: Never Used  Substance Use Topics  . Alcohol use: Yes    Alcohol/week: 1.0 standard drinks    Types: 1 Standard drinks or equivalent per week    Comment: social  . Drug use: No     Colonoscopy:  May 2016  PAP:  Bone density: 07/01/2016 at  [facility] showed normal, with a T score of -0.7  Lipid panel:  Allergies  Allergen Reactions  . Nsaids Other (See Comments)    H/o gastric bypass - avoid NSAIDs  . Tape Hives, Rash and Other (See Comments)    Adhesives in bandaids    Current Outpatient Medications  Medication Sig Dispense Refill  . acetaminophen (TYLENOL) 325 MG tablet Take 650 mg by mouth 2 (two) times daily as needed for moderate pain or headache.     [name] buPROPion (WELLBUTRIN SR) 200 MG 12  hr tablet TAKE 1 TABLET BY MOUTH DAILY AT 12 NOON. 30 tablet 0  . CALCIUM CITRATE-VITAMIN D PO Take 1 tablet by mouth 3 (three) times daily.     . cholecalciferol (VITAMIN D) 1000 units tablet Take 1,000 Units by mouth daily.    . Cyanocobalamin (VITAMIN B-12 PO) Take 1 tablet by mouth every 14 (fourteen) days.    . diphenhydrAMINE (BENADRYL) 25 MG tablet Take 25 mg by mouth daily as needed for allergies or sleep.     . Insulin Glargine (BASAGLAR KWIKPEN) 100 UNIT/ML SOPN INJECT 0.12 MLS (12 UNITS TOTAL) INTO THE SKIN DAILY. 5 pen 0  . Insulin Pen Needle (BD PEN NEEDLE NANO U/F) 32G X 4 MM MISC 1 Package by Does not apply route daily at 12 noon. 100 each 0  . losartan (COZAAR) 100 MG tablet Take 1 tablet (100 mg total) by mouth daily. 30 tablet 0  . Melatonin 3 MG CAPS Take 3 mg by mouth at bedtime.     . metFORMIN (GLUCOPHAGE) 500 MG tablet Take 1 tablet (500 mg total) by mouth daily. 12 tablet 0  . ONETOUCH DELICA LANCETS 62I MISC 1 Package by Does not apply route daily. 100 each 0  . Pediatric Multivitamins-Iron (FLINTSTONES PLUS IRON) chewable tablet Chew 1 tablet by mouth 2 (two) times daily.    . Probiotic Product ([employer] PROBIOTIC PO) Take 1 capsule by mouth daily.     [name] Propylene Glycol (SYSTANE BALANCE OP) Place 1-2 drops into both eyes 3 (three) times daily as needed (for dry eyes).     [name] senna-docusate (SENNA PLUS) 8.6-50 MG tablet Take 1 tablet by mouth daily as needed for moderate constipation.     . topiramate (TOPAMAX) 25 MG  tablet Take 1 tablet (25 mg total) by mouth daily. 30 tablet 0  . VICTOZA 18 MG/3ML SOPN INJECT 1.8MG(0.3 MLS) INTO THE SKIN EVERY MORNING (Patient taking differently: Inject 1.8 mg into the skin every morning. ) 3 pen 0  . vortioxetine HBr (TRINTELLIX) 20 MG TABS Take 20 mg by mouth at bedtime. 30 tablet 0   Current Facility-Administered Medications  Medication Dose Route Frequency Provider Last Rate Last Dose  . sodium chloride 0.9 % injection 10 mL  10 mL Intravenous PRN Magrinat, Virgie Dad, MD      . sodium chloride flush (NS) 0.9 % injection 10 mL  10 mL Intravenous PRN Magrinat, Virgie Dad, MD   10 mL at 12/16/17 0838    OBJECTIVE:  Vitals:   12/16/17 0820  BP: (!) 116/57  Pulse: 71  Resp: 18  Temp: 97.7 F (36.5 C)  SpO2: 99%     Body mass index is 29.33 kg/m.    ECOG FS:1 - Symptomatic but completely ambulatory Filed Weights   12/16/17 0820  Weight: 198 lb 9.6 oz (90.1 kg)  GENERAL: Patient is a well appearing female in no acute distress HEENT:  Sclerae anicteric.  Oropharynx clear and moist. No ulcerations or evidence of oropharyngeal candidiasis. Neck is supple.  NODES:  No cervical, supraclavicular, or axillary lymphadenopathy palpated.  BREAST EXAM:  Declined LUNGS:  Clear to auscultation bilaterally.  No wheezes or rhonchi. HEART:  Regular rate and rhythm. No murmur appreciated. ABDOMEN:  Soft, nontender.  Positive, normoactive bowel sounds. No organomegaly palpated. MSK:  No focal spinal tenderness to palpation. Full range of motion bilaterally in the upper extremities. EXTREMITIES:  No peripheral edema.   SKIN:  Clear with no obvious rashes or skin changes. No  nail dyscrasia. NEURO:  Nonfocal. Well oriented.  Appropriate affect.    LAB RESULTS:  CMP     Component Value Date/Time   NA 144 11/10/2017 1305   NA 143 03/11/2017 0841   NA 140 01/07/2017 0758   K 4.4 11/10/2017 1305   K 4.1 01/07/2017 0758   CL 110 11/10/2017 1305   CO2 25 11/10/2017 1305   CO2  24 01/07/2017 0758   GLUCOSE 86 11/10/2017 1305   GLUCOSE 134 01/07/2017 0758   BUN 26 (H) 11/10/2017 1305   BUN 40 (H) 03/11/2017 0841   BUN 33.6 (H) 01/07/2017 0758   CREATININE 1.50 (H) 11/10/2017 1305   CREATININE 1.5 (H) 01/07/2017 0758   CALCIUM 9.9 11/10/2017 1305   CALCIUM 9.3 01/07/2017 0758   PROT 7.2 11/10/2017 1305   PROT 6.9 03/11/2017 0841   PROT 6.7 01/07/2017 0758   ALBUMIN 3.9 11/10/2017 1305   ALBUMIN 4.5 03/11/2017 0841   ALBUMIN 3.6 01/07/2017 0758   AST 66 (H) 11/10/2017 1305   AST 51 (H) 01/07/2017 0758   ALT 74 (H) 11/10/2017 1305   ALT 82 (H) 01/07/2017 0758   ALKPHOS 169 (H) 11/10/2017 1305   ALKPHOS 115 01/07/2017 0758   BILITOT 0.7 11/10/2017 1305   BILITOT 0.72 01/07/2017 0758   GFRNONAA 35 (L) 11/10/2017 1305   GFRNONAA 55 (L) 02/18/2013 0827   GFRAA 41 (L) 11/10/2017 1305   GFRAA 63 02/18/2013 0827    INo results found for: SPEP, UPEP  Lab Results  Component Value Date   WBC 4.2 12/16/2017   NEUTROABS 2.7 12/16/2017   HGB 10.6 (L) 12/16/2017   HCT 32.4 (L) 12/16/2017   MCV 92.0 12/16/2017   PLT 83 (L) 12/16/2017      Chemistry      Component Value Date/Time   NA 144 11/10/2017 1305   NA 143 03/11/2017 0841   NA 140 01/07/2017 0758   K 4.4 11/10/2017 1305   K 4.1 01/07/2017 0758   CL 110 11/10/2017 1305   CO2 25 11/10/2017 1305   CO2 24 01/07/2017 0758   BUN 26 (H) 11/10/2017 1305   BUN 40 (H) 03/11/2017 0841   BUN 33.6 (H) 01/07/2017 0758   CREATININE 1.50 (H) 11/10/2017 1305   CREATININE 1.5 (H) 01/07/2017 0758      Component Value Date/Time   CALCIUM 9.9 11/10/2017 1305   CALCIUM 9.3 01/07/2017 0758   ALKPHOS 169 (H) 11/10/2017 1305   ALKPHOS 115 01/07/2017 0758   AST 66 (H) 11/10/2017 1305   AST 51 (H) 01/07/2017 0758   ALT 74 (H) 11/10/2017 1305   ALT 82 (H) 01/07/2017 0758   BILITOT 0.7 11/10/2017 1305   BILITOT 0.72 01/07/2017 0758       No results found for: LABCA2  No components found for: [id number]  No  results for input(s): INR in the last 168 hours.  Urinalysis    Component Value Date/Time   COLORURINE YELLOW 07/21/2015 [place] 07/21/2015 2328   LABSPEC 1.020 07/21/2015 2328   PHURINE 6.0 07/21/2015 2328   GLUCOSEU NEGATIVE 07/21/2015 2328   HGBUR TRACE (A) 07/21/2015 2328   BILIRUBINUR NEGATIVE 07/21/2015 2328   BILIRUBINUR neg 06/29/2013 1017   KETONESUR NEGATIVE 07/21/2015 2328   PROTEINUR 30 (A) 07/21/2015 2328   UROBILINOGEN negative 06/29/2013 1017   NITRITE NEGATIVE 07/21/2015 2328   LEUKOCYTESUR NEGATIVE 07/21/2015 2328    STUDIES: Mr 3d Recon At Scanner  Result Date: 11/18/2017 CLINICAL DATA:  Elevated liver function tests. Biopsy-proven hepatic adenocarcinoma. Breast carcinoma. Evaluate for pancreatic mass. EXAM: MRI ABDOMEN WITHOUT AND WITH CONTRAST (INCLUDING MRCP) TECHNIQUE: Multiplanar multisequence MR imaging of the abdomen was performed both before and after the administration of intravenous contrast. Heavily T2-weighted images of the biliary and pancreatic ducts were obtained, and three-dimensional MRCP images were rendered by post processing. CONTRAST:  9 mL Gadavist COMPARISON:  08/02/2017 FINDINGS: Lower chest: No acute findings. Hepatobiliary: Gross morphologic features of hepatic cirrhosis again demonstrated. Recanalization of paraumbilical veins, consistent with portal venous hypertension. Rim enhancing mass in segment 8 measures 2.5 cm on image 14/1201, increased in size from 1.4 cm on previous study of 08/02/2017. No other hepatic masses are identified. Tiny gallstones are again seen, without evidence of cholecystitis. No evidence of biliary ductal dilatation with common bile duct measuring 4 mm. No evidence of choledocholithiasis. Pancreas: No mass or inflammatory changes. No evidence of pancreatic ductal dilatation. Pancreas divisum is noted. Spleen:  Within normal limits in size and appearance. Adrenals/Urinary Tract: No masses identified. No  evidence of hydronephrosis. Stomach/Bowel: Visualized portion unremarkable. Vascular/Lymphatic: Mild lymphadenopathy in the porta hepatis shows no significant change, with largest lymph node measuring approximately 1.5 cm. No new sites of lymphadenopathy identified. No evidence of abdominal aortic aneurysm. Other:  None. Musculoskeletal:  No suspicious bone lesions identified. IMPRESSION: 2.5 cm right hepatic lobe mass (adenocarcinoma by recent biopsy) shows mild increase in size since 08/02/2017 exam. No other hepatic masses identified. Stable mild porta hepatis lymphadenopathy. No new sites of metastatic disease within the abdomen. Hepatic cirrhosis and portal venous hypertension. Cholelithiasis.  No radiographic evidence of cholecystitis. No evidence of pancreatic mass, biliary ductal dilatation, or choledocholithiasis. Electronically Signed   By: Earle Gell M.D.   On: 11/18/2017 12:07   Nm Pet Image Restag (ps) Skull Base To Thigh  Result Date: 12/09/2017 CLINICAL DATA:  Subsequent treatment strategy for metastatic adenocarcinoma of occult primary tumor. EXAM: NUCLEAR MEDICINE PET SKULL BASE TO THIGH TECHNIQUE: 9.64 mCi F-18 FDG was injected intravenously. Full-ring PET imaging was performed from the skull base to thigh after the radiotracer. CT data was obtained and used for attenuation correction and anatomic localization. Fasting blood glucose: 81 mg/dl COMPARISON:  07/10/2017 FINDINGS: Mediastinal blood pool activity: SUV max 2.76 NECK: No hypermetabolic lymph nodes in the neck. Incidental CT findings: none CHEST: No hypermetabolic supraclavicular lymph nodes. Within the right subpectoral region there is a 9 mm lymph node within SUV max of 5.3. New from previous exam. No hypermetabolic mediastinal or hilar lymph nodes.No hypermetabolic pulmonary nodules or masses. Incidental CT findings: Aortic atherosclerosis. Calcification in the RCA, LAD and left circumflex coronary arteries noted. Post treatment  changes from right mastectomy and right axillary nodal dissection again noted. ABDOMEN/PELVIS: 2.5 cm right lobe of liver hypermetabolic lesion is again noted. SUV max equals 20.3. Previously this measured 1.7 cm and had an SUV max of 9.88. No additional liver lesions identified. No abnormal uptake within the pancreas or spleen. Unremarkable appearance of the adrenal glands. No hypermetabolic lymph nodes identified within the abdomen. No hypermetabolic pelvic lymph nodes. Incidental CT findings: Postop changes from gastric bypass surgery noted. Gallstones. Aortic atherosclerosis. No aneurysm. Prominent upper abdominal lymph nodes are again noted without significant FDG uptake. For example peripancreatic lymph node measures 1.3 cm and has an SUV max of 2.77. Unchanged from previous exam. Portacaval node measures 1.4 cm and has an SUV max of 2.87. Unchanged from previous exam. Calcified uterine fibroids. SKELETON: No focal hypermetabolic activity  to suggest skeletal metastasis. Incidental CT findings: Degenerative changes are noted involving the left sternoclavicular joint in bilateral acromioclavicular joints with corresponding increased uptake indicating inflammation. IMPRESSION: 1. Right lobe of liver metastasis has increased in size and degree of FDG uptake compared with previous exam. 2. Recurrent hypermetabolic lymph node within the right subpectoral region. 3. Mild upper abdominal adenopathy without significant FDG uptake. Unchanged. 4.  Aortic Atherosclerosis (ICD10-I70.0). 5. Coronary artery atherosclerotic calcifications 6. Gallstones. Electronically Signed   By: Kerby Moors M.D.   On: 12/09/2017 16:41   Mr Abdomen Mrcp Moise Boring Contast  Result Date: 11/18/2017 CLINICAL DATA:  Elevated liver function tests. Biopsy-proven hepatic adenocarcinoma. Breast carcinoma. Evaluate for pancreatic mass. EXAM: MRI ABDOMEN WITHOUT AND WITH CONTRAST (INCLUDING MRCP) TECHNIQUE: Multiplanar multisequence MR imaging of  the abdomen was performed both before and after the administration of intravenous contrast. Heavily T2-weighted images of the biliary and pancreatic ducts were obtained, and three-dimensional MRCP images were rendered by post processing. CONTRAST:  9 mL Gadavist COMPARISON:  08/02/2017 FINDINGS: Lower chest: No acute findings. Hepatobiliary: Gross morphologic features of hepatic cirrhosis again demonstrated. Recanalization of paraumbilical veins, consistent with portal venous hypertension. Rim enhancing mass in segment 8 measures 2.5 cm on image 14/1201, increased in size from 1.4 cm on previous study of 08/02/2017. No other hepatic masses are identified. Tiny gallstones are again seen, without evidence of cholecystitis. No evidence of biliary ductal dilatation with common bile duct measuring 4 mm. No evidence of choledocholithiasis. Pancreas: No mass or inflammatory changes. No evidence of pancreatic ductal dilatation. Pancreas divisum is noted. Spleen:  Within normal limits in size and appearance. Adrenals/Urinary Tract: No masses identified. No evidence of hydronephrosis. Stomach/Bowel: Visualized portion unremarkable. Vascular/Lymphatic: Mild lymphadenopathy in the porta hepatis shows no significant change, with largest lymph node measuring approximately 1.5 cm. No new sites of lymphadenopathy identified. No evidence of abdominal aortic aneurysm. Other:  None. Musculoskeletal:  No suspicious bone lesions identified. IMPRESSION: 2.5 cm right hepatic lobe mass (adenocarcinoma by recent biopsy) shows mild increase in size since 08/02/2017 exam. No other hepatic masses identified. Stable mild porta hepatis lymphadenopathy. No new sites of metastatic disease within the abdomen. Hepatic cirrhosis and portal venous hypertension. Cholelithiasis.  No radiographic evidence of cholecystitis. No evidence of pancreatic mass, biliary ductal dilatation, or choledocholithiasis. Electronically Signed   By: Earle Gell M.D.   On:  11/18/2017 12:07     ASSESSMENT: 67 y.o. [place] woman status post right breast upper outer quadrant and right axillary lymph node biopsy 07/19/2014 for a cT2  pN1, stage IIB  invasive ductal carcinoma, grade 2, estrogen and progesterone receptor negative, with an MIB-1 of 90%, and HER-2 amplified  (1) neoadjuvant chemotherapy started 08/08/2014, consisting of carboplatin, docetaxel, trastuzumab and pertuzumab given every 3 weeks 6, completed 11/21/2014  (a) breast MRI after initial 3 cycles shows a significant response  (b) breast MRI after 6 cycles shows a complete radiologic response   (2) trastuzumab continued through 10/02/2015 (to end of capecitabine therapy)  (a) echo 02/13/2015 shows an EF of 60-65%  (b)  echo 05/09/2015 shows an ejection fraction of 60-65%  (c) echocardiogram 06/27/2015 shows an ejection fraction of 55-60%.  (d) echocardiogram 01/22/2017 shows an ejection fraction of 55-60%  (3) right lumpectomy with sentinel lymph node biopsy on 12/27/14 showed a residual  ypT2 ypN1a, stage IIB invasive ductal carcinoma, grade 2, with repeat estrogen and progesterone receptors again negative  (4) status post right oncoplastic surgery (with left reduction mammoplasty) 01/06/2015  showing in the right breast multiple foci of intralymphatic carcinoma in random sections  (5)  Right modified radical mastectomy 02/15/2014 showed no residual invasive disease; there was DCIS but margins were negative; all 15 axillary lymph nodes were clear; ypTis ypN0  (a)  The patient has decided against reconstruction.  (6)  Postmastectomy radiation completed 05/18/2015 with capecitabine sensitization  (7) adjuvant capecitabine continued through 09/27/2015  (8) hepatic steatosis with increased fibrosis score (at risk for cirrhosis)  (9) thyroid biopsy (left lobe inferiorly) 2 shows benign tissue 06/28/2015  (10) LOCAL RECURRENCE:  (a) PET scan 12/06/2015 Notes a right retropectoral lymph node  measuring 0.9 cm with an SUV max of 5.5  (b) repeat PET scan 03/11/2016 finds the right subpectoral lymph node to now measure 1.3 cm with an SUV max of 11.8.  (c) PET scan 05/07/2016 shows a significant response to T-DM 1  (d) PET scan 07/10/2017 is clear except for what appears to be a single new liver lesion   (11) started T-DM1 03/21/2016, completed 8 doses 08/13/2016  (a) PET scan 08/19/2016 shows a good response but measurable residual disease  (b) PET scan on 01/16/2017 shows a continuing response  (12) maintenance trastuzumab started 09/03/2016--changed to every 28 days as of 01/28/2017  (a) repeat echocardiogram 07/08/2016 shows an ejection fraction of 60-65%.  (b) echocardiogram on 10/08/2016 demonstrates LVEF of 55-60% (followed by Dr. Haroldine Laws)  (c) echo 01/22/2017 shows an ejection fraction in the 55-60% range  (d) echo 04/24/2017 shows an ejection fraction in the 55-60% range (recommendation for echo every 6 months by Dr. Aundra Dubin)  (e) trastuzumab changed to T-DM 1 after trastuzumab dose 07/15/2017  METASTATIC DISEASE: July 2019 (13)  Radiation to the area of persistent disease in the right upper chest completed 10/30/2016   (14) T-DM 1 resumed 08/12/2017, repeated every 21 days  (a) baseline liver MRI 08/02/2017 shows a 1.4 cm mass, "hot" on PET 07/10/2017  (b) repeat liver MRI 10/22/2018 3:19 cycles of T-DM 1, shows slight enlargement  (c) T-DM 1 discontinued after 09/23/2017 dose  (15) trastuzumab resumed 11/18/2017, to be repeated every 4 weeks  (16) liver biopsy 11/03/2017 shows adenocarcinoma, most consistent with an upper gastrointestinal primary; this tumor was estrogen receptor and HER-2 negative  (a) GI workup for occult primary ([place]) negative  (b) PET 12/09/2017 shows no obvious primary.  There is a right subpectoral lymph node noted as well  (c) CA 27-29 and CA 19-9 uninformative; CEA pending   (17) to start Abraxane on 12/10, given days 1 and 8 on a 21 day  cycle x 3, then restaging with MRI liver  (18) consider Yttrium or other local treatment to liver per IR  PLAN:  Darlene [name] is doing moderately well today.  Her CBC is stable and she will proceed with Trastuzumab today.  Her CMET was pending at time of appointment.  Darlene [name] met with Dr. Jana Hakim and PET scan was reviewed which shows increase in liver lesion, and subpectoral nodes.  He noted that the work up for primary GI malignancy was negative.  He noted the subpectoral node.  He recommended Abraxane given on days 1 and 8 on a 21 day cycle.  This was requested to start on 12/10.  Darlene [name] will continue receiving Trastuzumab every 4 weeks.    Darlene [name] will be restaged with MRI liver in early February, after 3 cycles of day 1 and 8 Abraxane.  I requested this for 02/24/2018 prior to her 03/03/2018 f/u with Dr. Jana Hakim.  Darlene [name] and her husband verbalize understanding of everything.  She knows to call for any other issues that may develop before the next visit.   Wilber Bihari, NP  12/16/17 8:59 AM Medical Oncology and Hematology [facility] [address] [place], [place] [address] Tel. [phone]    Fax. [phone]   ADDENDUM: [name]'s situation is complex.  She has a breast cancer which is HER-2 positive, estrogen receptor negative.  We are continuing to treat that with trastuzumab.  There is a right subpectoral lymph node which is of concern in that regard.  More importantly she has the liver lesions which were biopsied and are triple negative.  The immunohistochemistry suggested a GI primary but we have not been able to document that.  Accordingly we are going to treat with Abraxane.  If this is GI should work and if it is breast that should work.  After 3 cycles we will restage and at that point we will consider whether it would be helpful for her to have some local treatment as well perhaps atrium.  It will be helpful to have a baseline liver MRI which we are also ordering.  I  personally saw this patient and performed a substantive portion of this encounter with the listed APP documented above.   Chauncey Cruel, MD Medical Oncology and Hematology [facility] [address] [place], [place] [address] Tel. [phone]    Fax. [phone]

## 2017-12-16 NOTE — Patient Instructions (Signed)

## 2017-12-23 ENCOUNTER — Ambulatory Visit (INDEPENDENT_AMBULATORY_CARE_PROVIDER_SITE_OTHER): Payer: Medicare Other | Admitting: Family Medicine

## 2017-12-23 VITALS — BP 146/71 | HR 63 | Temp 97.4°F | Ht 69.0 in | Wt 195.0 lb

## 2017-12-23 DIAGNOSIS — F418 Other specified anxiety disorders: Secondary | ICD-10-CM

## 2017-12-23 DIAGNOSIS — E669 Obesity, unspecified: Secondary | ICD-10-CM | POA: Diagnosis not present

## 2017-12-23 DIAGNOSIS — Z794 Long term (current) use of insulin: Secondary | ICD-10-CM

## 2017-12-23 DIAGNOSIS — E1129 Type 2 diabetes mellitus with other diabetic kidney complication: Secondary | ICD-10-CM

## 2017-12-23 DIAGNOSIS — Z6830 Body mass index (BMI) 30.0-30.9, adult: Secondary | ICD-10-CM

## 2017-12-23 DIAGNOSIS — I10 Essential (primary) hypertension: Secondary | ICD-10-CM

## 2017-12-23 MED ORDER — BUPROPION HCL ER (SR) 200 MG PO TB12: ORAL_TABLET | ORAL | 0 refills | Status: DC

## 2017-12-23 MED ORDER — LOSARTAN POTASSIUM 100 MG PO TABS: 100.0000 mg | ORAL_TABLET | Freq: Every day | ORAL | 0 refills | Status: DC

## 2017-12-23 MED ORDER — LIRAGLUTIDE 18 MG/3ML ~~LOC~~ SOPN: PEN_INJECTOR | SUBCUTANEOUS | 0 refills | Status: DC

## 2017-12-23 MED ORDER — INSULIN PEN NEEDLE 32G X 4 MM MISC: 1.0000 | Freq: Every day | 0 refills | Status: DC

## 2017-12-23 MED ORDER — TOPIRAMATE 25 MG PO TABS: 25.0000 mg | ORAL_TABLET | Freq: Every day | ORAL | 0 refills | Status: DC

## 2017-12-23 MED ORDER — METFORMIN HCL 500 MG PO TABS: 500.0000 mg | ORAL_TABLET | Freq: Every day | ORAL | 0 refills | Status: DC

## 2017-12-25 NOTE — Progress Notes (Signed)
Office: [phone]  /  Fax: [phone]   HPI:   Chief Complaint: OBESITY Darlene [name] is here to discuss her progress with her obesity treatment plan. She is on the keep a food journal with 1300 to 1500 calories and 80+ grams of protein daily plan and is following her eating plan approximately 60 % of the time. She states she is exercising 0 minutes 0 times per week. Darlene [name] notes increased emotional eating with new positive lymph node found. She knows she is eating emotionally and she recognizes this isn't helping her health. Darlene [name] has gone back to going to [place] daily and she is eating an additional 500+ calories there. Darlene [name] is status post gastric bypass in 2014. Her weight is 195 lb (88.5 kg) today and has had a weight gain of 2 pounds over a period of 4 weeks since her last visit. She has lost 30 lbs since starting treatment with [name].  Diabetes II on insulin with renal complications, stage III renal failure Darlene [name] has a diagnosis of diabetes type II. Darlene [name] is not checking her blood sugar often and she has noted one hypoglycemic episode at 1:30 AM, but she was awakened with symptoms and she quickly addressed the hypoglycemia. Last A1c was at 5.8 She has been working on intensive lifestyle modifications including diet, exercise, and weight loss to help control her blood glucose levels. Darlene [name] notes increased simple carbohydrates recently and she has been avoiding seeing her blood sugars increase.  Hypertension Darlene [name] is a 67 y.o. female with hypertension. Her blood pressure is elevated today at 146/71 and has been controlled previously. Darlene [name] denies chest pain or headache. She is working weight loss to help control her blood pressure with the goal of decreasing her risk of heart attack and stroke. Darlene [name] blood pressure is not currently controlled.  Depression with emotional eating behaviors Darlene [name] is working through her situational depression  and coping strategies to find ways to avoid increasing emotional eating which is counter productive to her cancer treatment. Darlene [name] struggles with emotional eating and using food for comfort to the extent that it is negatively impacting her health. She often snacks when she is not hungry. Darlene [name] sometimes feels she is out of control and then feels guilty that she made poor food choices. She has been working on behavior modification techniques to help reduce her emotional eating and has been somewhat successful. She shows no sign of suicidal or homicidal ideations.  Depression screen [facility] 2/9 09/12/2016 08/09/2016  Decreased Interest 0 1  Down, Depressed, Hopeless 0 1  PHQ - 2 Score 0 2  Altered sleeping - 0  Tired, decreased energy - 1  Change in appetite - 2  Feeling bad or failure about yourself  - 2  Trouble concentrating - 1  Moving slowly or fidgety/restless - 1  Suicidal thoughts - 0  PHQ-9 Score - 9  Some recent data might be hidden     ALLERGIES: Allergies  Allergen Reactions  . Nsaids Other (See Comments)    H/o gastric bypass - avoid NSAIDs  . Tape Hives, Rash and Other (See Comments)    Adhesives in bandaids    MEDICATIONS: Current Outpatient Medications on File Prior to Visit  Medication Sig Dispense Refill  . acetaminophen (TYLENOL) 325 MG tablet Take 650 mg by mouth 2 (two) times daily as needed for moderate pain or headache.     [name] CALCIUM CITRATE-VITAMIN D PO Take 1 tablet by mouth 3 (three) times daily.     [name]  cholecalciferol (VITAMIN D) 1000 units tablet Take 1,000 Units by mouth daily.    . Cyanocobalamin (VITAMIN B-12 PO) Take 1 tablet by mouth every 14 (fourteen) days.    . diphenhydrAMINE (BENADRYL) 25 MG tablet Take 25 mg by mouth daily as needed for allergies or sleep.     . Insulin Glargine (BASAGLAR KWIKPEN) 100 UNIT/ML SOPN INJECT 0.12 MLS (12 UNITS TOTAL) INTO THE SKIN DAILY. 5 pen 0  . Melatonin 3 MG CAPS Take 3 mg by mouth at bedtime.     Glory Rosebush  DELICA LANCETS 07M MISC 1 Package by Does not apply route daily. 100 each 0  . Pediatric Multivitamins-Iron (FLINTSTONES PLUS IRON) chewable tablet Chew 1 tablet by mouth 2 (two) times daily.    . Probiotic Product ([employer] PROBIOTIC PO) Take 1 capsule by mouth daily.     [name] Propylene Glycol (SYSTANE BALANCE OP) Place 1-2 drops into both eyes 3 (three) times daily as needed (for dry eyes).     [name] senna-docusate (SENNA PLUS) 8.6-50 MG tablet Take 1 tablet by mouth daily as needed for moderate constipation.     . vortioxetine HBr (TRINTELLIX) 20 MG TABS Take 20 mg by mouth at bedtime. 30 tablet 0   No current facility-administered medications on file prior to visit.     PAST MEDICAL HISTORY: Past Medical History:  Diagnosis Date  . Anemia    hx of - during 1st round chemo  . Anxiety   . Arthritis    in fingers, shoulders  . Back pain   . Balance problem   . Breast cancer ([place])   . Breast cancer of upper-outer quadrant of right female breast ([place]) 07/22/2014  . Bunion, right foot   . Clumsiness   . Constipation   . Depression   . Diabetes mellitus without complication ([place])    22+ years  . Eating disorder    binge eating  . Epistaxis 08/26/2014  . Fatty liver   . HCAP (healthcare-associated pneumonia) 12/18/2014  . Headache   . Heart murmur    never had any problems  . History of radiation therapy 04/05/15-05/19/15   right chest wall was treated to 50.4 Gy in 28 fractions, right mastectomy scar was treated to 10 Gy in 5 fractions  . Hyperlipidemia   . Hypertension   . Joint pain   . Multiple food allergies   . Neuromuscular disorder ([place])    diabetic neuropathy  . Obesity   . Obstructive sleep apnea on CPAP    uses cpap nightly  . Ovarian cyst rupture    possible  . Pneumonia   . Pneumonia 11/2014  . Radiation 04/05/15-05/19/15   right chest wall 50.4 Gy, mastectomy scar 10 Gy  . Sleep apnea   . Thyroid nodule 06/2014    PAST SURGICAL HISTORY: Past Surgical History:    Procedure Laterality Date  . APPENDECTOMY    . BIOPSY THYROID Bilateral 06/2014   2 nodules - Benign   . BREAST LUMPECTOMY WITH RADIOACTIVE SEED AND SENTINEL LYMPH NODE BIOPSY Right 12/27/2014   Procedure: BREAST LUMPECTOMY WITH RADIOACTIVE SEED AND SENTINEL LYMPH NODE BIOPSY;  Surgeon: Stark Klein, MD;  Location: [facility];  Service: General;  Laterality: Right;  . BREAST REDUCTION SURGERY Bilateral 01/06/2015   Procedure: BILATERAL BREAST REDUCTION, RIGHT ONCOPLASTIC RECONSTRUCTION),LEFT BREAST REDUCTION FOR SYMMETRY;  Surgeon: [name], MD;  Location: [facility];  Service: Plastics;  Laterality: Bilateral;  . BREATH TEK H PYLORI N/A 09/15/2012  Procedure: BREATH TEK H PYLORI;  Surgeon: Pedro Earls, MD;  Location: [facility];  Service: General;  Laterality: N/A;  . BUNIONECTOMY Left 12/2013  . CARPAL TUNNEL RELEASE Right   . CARPAL TUNNEL RELEASE Left   . CATARACT EXTRACTION     6/19  . COLONOSCOPY    . DUPUYTREN CONTRACTURE RELEASE    . GASTRIC ROUX-EN-Y N/A 11/02/2012   Procedure: LAPAROSCOPIC ROUX-EN-Y GASTRIC;  Surgeon: Pedro Earls, MD;  Location: [facility];  Service: General;  Laterality: N/A;  . IR GENERIC HISTORICAL  03/18/2016   IR US GUIDE VASC ACCESS LEFT 03/18/2016 Markus Daft, MD [facility]  . IR GENERIC HISTORICAL  03/18/2016   IR FLUORO GUIDE CV LINE LEFT 03/18/2016 Markus Daft, MD [facility]  . LAPAROSCOPIC APPENDECTOMY N/A 07/22/2015   Procedure: APPENDECTOMY LAPAROSCOPIC;  Surgeon: Michael Boston, MD;  Location: [facility];  Service: General;  Laterality: N/A;  . MASTECTOMY Right 02/15/2015   modified  . MODIFIED MASTECTOMY Right 02/15/2015   Procedure: RIGHT MODIFIED RADICAL MASTECTOMY;  Surgeon: Stark Klein, MD;  Location: [facility];  Service: General;  Laterality: Right;  . PORT-A-CATH REMOVAL Left 10/10/2015   Procedure: PORT REMOVAL;  Surgeon: Stark Klein, MD;  Location: [facility];  Service: General;  Laterality: Left;  . PORTACATH PLACEMENT Left  08/02/2014   Procedure: INSERTION PORT-A-CATH;  Surgeon: Stark Klein, MD;  Location: [facility];  Service: General;  Laterality: Left;  . PORTACATH PLACEMENT N/A 11/05/2016   Procedure: INSERTION PORT-A-CATH;  Surgeon: Stark Klein, MD;  Location: [facility];  Service: General;  Laterality: N/A;  . ROTATOR CUFF REPAIR Right 2005  . SCAR REVISION Right 12/08/2015   Procedure: COMPLEX REPAIR RIGHT CHEST 10-15CM;  Surgeon: [name], MD;  Location: [facility];  Service: Plastics;  Laterality: Right;  . TOE SURGERY  1970s   bone spur  . TRIGGER FINGER RELEASE     x3    SOCIAL HISTORY: Social History   Tobacco Use  . Smoking status: Never Smoker  . Smokeless tobacco: Never Used  Substance Use Topics  . Alcohol use: Yes    Alcohol/week: 1.0 standard drinks    Types: 1 Standard drinks or equivalent per week    Comment: social  . Drug use: No    FAMILY HISTORY: Family History  Problem Relation Age of Onset  . CVA Mother   . Heart disease Mother   . Sudden death Mother   . Diabetes Father   . Heart failure Father   . Hypertension Father   . Kidney disease Father   . Obesity Father   . Hypertension Sister   . Diabetes Brother   . Breast cancer Paternal Grandmother 40  . Diabetes Paternal Uncle   . Ovarian cancer Neg Hx     ROS: Review of Systems  Constitutional: Negative for weight loss.  Cardiovascular: Negative for chest pain.  Neurological: Negative for headaches.  Endo/Heme/Allergies:       Positive for hypoglycemia  Psychiatric/Behavioral: Positive for depression. Negative for suicidal ideas.    PHYSICAL EXAM: Blood pressure (!) 146/71, pulse 63, temperature (!) 97.4 F (36.3 C), temperature source Oral, height 5\' 9"  (1.753 m), weight 195 lb (88.5 kg), last menstrual period 10/22/2007, SpO2 98 %. Body mass index is 28.8 kg/m. Physical Exam  Constitutional: She is oriented to person, place, and time. She appears well-developed  and well-nourished.  Cardiovascular: Normal rate.  Pulmonary/Chest: Effort normal.  Musculoskeletal: Normal range of motion.  Neurological:  She is oriented to person, place, and time.  Skin: Skin is warm and dry.  Psychiatric: She has a normal mood and affect. Her behavior is normal. She expresses no homicidal and no suicidal ideation.  Vitals reviewed.   RECENT LABS AND TESTS: BMET    Component Value Date/Time   NA 144 12/16/2017 0806   NA 143 03/11/2017 0841   NA 140 01/07/2017 0758   K 4.2 12/16/2017 0806   K 4.1 01/07/2017 0758   CL 114 (H) 12/16/2017 0806   CO2 23 12/16/2017 0806   CO2 24 01/07/2017 0758   GLUCOSE 136 (H) 12/16/2017 0806   GLUCOSE 134 01/07/2017 0758   BUN 28 (H) 12/16/2017 0806   BUN 40 (H) 03/11/2017 0841   BUN 33.6 (H) 01/07/2017 0758   CREATININE 1.74 (H) 12/16/2017 0806   CREATININE 1.5 (H) 01/07/2017 0758   CALCIUM 9.1 12/16/2017 0806   CALCIUM 9.3 01/07/2017 0758   GFRNONAA 30 (L) 12/16/2017 0806   GFRNONAA 55 (L) 02/18/2013 0827   GFRAA 35 (L) 12/16/2017 0806   GFRAA 63 02/18/2013 0827   Lab Results  Component Value Date   HGBA1C 5.8 (H) 09/03/2017   HGBA1C 6.1 (H) 03/11/2017   HGBA1C 8.2 (H) 11/05/2016   HGBA1C 8.1 (H) 08/09/2016   HGBA1C 6.5 (H) 01/03/2015   Lab Results  Component Value Date   INSULIN 15.3 03/11/2017   INSULIN 18.3 08/09/2016   CBC    Component Value Date/Time   WBC 4.2 12/16/2017 0806   WBC 3.9 (L) 11/03/2017 0631   RBC 3.52 (L) 12/16/2017 0806   HGB 10.6 (L) 12/16/2017 0806   HGB 10.8 (L) 01/07/2017 0758   HCT 32.4 (L) 12/16/2017 0806   HCT 31.9 (L) 01/07/2017 0758   PLT 83 (L) 12/16/2017 0806   PLT 126 (L) 01/07/2017 0758   MCV 92.0 12/16/2017 0806   MCV 90.8 01/07/2017 0758   MCH 30.1 12/16/2017 0806   MCHC 32.7 12/16/2017 0806   RDW 13.7 12/16/2017 0806   RDW 14.8 (H) 01/07/2017 0758   LYMPHSABS 1.0 12/16/2017 0806   LYMPHSABS 1.1 01/07/2017 0758   MONOABS 0.3 12/16/2017 0806   MONOABS 0.3  01/07/2017 0758   EOSABS 0.1 12/16/2017 0806   EOSABS 0.1 01/07/2017 0758   EOSABS 0.1 08/09/2016 1229   BASOSABS 0.0 12/16/2017 0806   BASOSABS 0.0 01/07/2017 0758   Iron/TIBC/Ferritin/ %Sat    Component Value Date/Time   IRON 58 02/18/2013 0827   TIBC 305 02/18/2013 0827   FERRITIN 133 10/31/2014 0816   IRONPCTSAT 19 (L) 02/18/2013 0827   Lipid Panel     Component Value Date/Time   CHOL 160 09/03/2017 1349   TRIG 111 09/03/2017 1349   HDL 59 09/03/2017 1349   CHOLHDL 2.9 02/18/2013 0827   VLDL 23 02/18/2013 0827   LDLCALC 79 09/03/2017 1349   Hepatic Function Panel     Component Value Date/Time   PROT 6.5 12/16/2017 0806   PROT 6.9 03/11/2017 0841   PROT 6.7 01/07/2017 0758   ALBUMIN 3.7 12/16/2017 0806   ALBUMIN 4.5 03/11/2017 0841   ALBUMIN 3.6 01/07/2017 0758   AST 43 (H) 12/16/2017 0806   AST 51 (H) 01/07/2017 0758   ALT 57 (H) 12/16/2017 0806   ALT 82 (H) 01/07/2017 0758   ALKPHOS 154 (H) 12/16/2017 0806   ALKPHOS 115 01/07/2017 0758   BILITOT 0.6 12/16/2017 0806   BILITOT 0.72 01/07/2017 0758      Component Value Date/Time  TSH 1.54 02/07/2017   TSH 2.170 08/09/2016 1229    Ref. Range 09/03/2017 13:49  Vitamin D, 25-Hydroxy Latest Ref Range: 30.0 - 100.0 ng/mL 58.3   ASSESSMENT AND PLAN: Type 2 diabetes mellitus with other diabetic kidney complication, with long-term current use of insulin (HCC) - Plan: Insulin Pen Needle (BD PEN NEEDLE NANO U/F) 32G X 4 MM MISC, metFORMIN (GLUCOPHAGE) 500 MG tablet  Essential hypertension - Plan: losartan (COZAAR) 100 MG tablet  Depression with anxiety - Plan: topiramate (TOPAMAX) 25 MG tablet, buPROPion (WELLBUTRIN SR) 200 MG 12 hr tablet  Class 1 obesity with serious comorbidity and body mass index (BMI) of 30.0 to 30.9 in adult, unspecified obesity type - Starting BMI greater then 30  PLAN:  Diabetes II on insulin with renal complications, stage III renal failure Darlene [name] has been given extensive diabetes  education by myself today including ideal fasting and post-prandial blood glucose readings, individual ideal Hgb A1c goals and hypoglycemia prevention. We discussed the importance of good blood sugar control to decrease the likelihood of diabetic complications such as nephropathy, neuropathy, limb loss, blindness, coronary artery disease, and death. We discussed the importance of intensive lifestyle modification including diet, exercise and weight loss as the first line treatment for diabetes. Darlene [name] agrees to continue her diabetes medications and prescriptions were written today for one month refills of metformin and victoza. Prescription was written today for nano needles (use 2 times daily) and Darlene [name] will bring in her blood sugar log to the next visit. Darlene [name] agreed to follow up at the agreed upon time.  Hypertension We discussed sodium restriction, working on healthy weight loss, and a regular exercise program as the means to achieve improved blood pressure control. Darlene [name] agreed with this plan and agreed to follow up as directed. We will recheck blood pressure in 2 weeks and will continue to monitor her blood pressure as well as her progress with the above lifestyle modifications. She agrees to continue Losartan 100 mg daily #30 with no refills and will watch for signs of hypotension as she continues her lifestyle modifications.  Depression with Emotional Eating Behaviors We discussed behavior modification techniques today to help Darlene [name] deal with her emotional eating and depression. She has agreed to continue Topamax 25 mg daily #30 with no refills and Wellbutrin SR 200 mg qd #30 with no refills and follow up as directed.  Obesity Darlene [name] is currently in the action stage of change. As such, her goal is to continue with weight loss efforts She has agreed to keep a food journal with 1300 to 1500 calories and 80+ grams of protein daily Darlene [name] has been instructed to work up to a goal of  150 minutes of combined cardio and strengthening exercise per week for weight loss and overall health benefits. We discussed the following Behavioral Modification Strategies today: increasing lean protein intake, decreasing simple carbohydrates , decrease eating out and emotional eating strategies  Darlene [name] has agreed to follow up with our clinic in 2 weeks. She was informed of the importance of frequent follow up visits to maximize her success with intensive lifestyle modifications for her multiple health conditions.   OBESITY BEHAVIORAL INTERVENTION VISIT  Today's visit was # 28  Starting weight: 225 lbs Starting date: 08/09/2016 Today's weight : 195 lbs Today's date: 12/23/2017 Total lbs lost to date: 30 At least 15 minutes were spent on discussing the following behavioral intervention visit.   ASK: We discussed the diagnosis of obesity with Darlene [name] today and Darlene [name] Schmid  agreed to give [name] permission to discuss obesity behavioral modification therapy today.  ASSESS: Nayomi has the diagnosis of obesity and her BMI today is 28.78 Wreatha is in the action stage of change   ADVISE: Kamsiyochukwu was educated on the multiple health risks of obesity as well as the benefit of weight loss to improve her health. She was advised of the need for long term treatment and the importance of lifestyle modifications to improve her current health and to decrease her risk of future health problems.  AGREE: Multiple dietary modification options and treatment options were discussed and  Maricia agreed to follow the recommendations documented in the above note.  ARRANGE: Kayleana was educated on the importance of frequent visits to treat obesity as outlined per CMS and USPSTF guidelines and agreed to schedule her next follow up appointment today.  I, Doreene Nest, am acting as transcriptionist for Dennard Nip, MD  I have reviewed the above documentation for accuracy and completeness, and I  agree with the above. -Dennard Nip, MD

## 2017-12-29 ENCOUNTER — Other Ambulatory Visit: Payer: Self-pay | Admitting: Oncology

## 2017-12-29 NOTE — Progress Notes (Addendum)
[place]  [phone] [phone]   ID: Darlene [name] DOB: 06-11-1961  MR#: [id number]  FWY#:[id number]  Patient Care Team: Vernie Shanks, MD as PCP - General (Family Medicine) Himmelrich, Bryson Ha, RD (Inactive) as Dietitian (Bariatrics) Stark Klein, MD as Consulting Physician (General Surgery) Magrinat, Virgie Dad, MD as Consulting Physician (Oncology) Arloa Koh, MD as Consulting Physician (Radiation Oncology) Mauro Kaufmann, RN as Registered Nurse Rockwell Germany, RN as Registered Nurse Jacelyn Pi, MD as Consulting Physician (Endocrinology) Kem Boroughs, [place] as Nurse Practitioner (Family Medicine) [name], MD as Consulting Physician (Plastic Surgery) Larey Dresser, MD as Consulting Physician (Cardiology) Gery Pray, MD as Consulting Physician (Radiation Oncology) Juanita Craver, MD as Consulting Physician (Gastroenterology) PCP: Vernie Shanks, MD OTHER MD:  CHIEF COMPLAINT:  HER-2 positive, estrogen receptor negative locally recurrent disease  CURRENT TREATMENT:  Trastuzumab; Abraxane  INTERVAL HISTORY:  Darlene [name] returns today for follow-up and treatment of her estrogen receptor negative but HER-2 amplified breast cancer accompanied by her husband. She is due to start Abraxane today.  She is doing well.   REVIEW OF SYSTEMS: Darlene [name] denies any unusual headaches or vision changes.  She is without chest pain, palpitations, cough, shortness of breath.  She doesn't have any fevers, chills, bowel/bladder issues, nausea or vomiting.  A detailed ROS was otherwise non contributory.    BREAST CANCER HISTORY: From the original intake note:  "Darlene [name]" had screening mammography at her gynecologist suggestive of a possible abnormality in the right breast. However right diagnostic mammogram 04/12/2013 at [facility] was negative. More recently however, she noted a lump in her right breast and brought it to her gynecologist's  attention. On 07/18/2014 the patient had bilateral diagnostic mammography with tomosynthesis at [facility]. The breast density was category B. In the right breast upper outer quadrant there was an area of focal asymmetry with indistinct margins.[name] Ultrasound was obtained and showed in addition to the mass in question, measuring 1.7 cm, an abnormal-appearing lymph node in the right axillary tail.  On 07/19/2014 the patient underwent biopsy of the right breast massin question as well as a right axillary lymph node. Both were positive for invasive ductal carcinoma, grade 2, estrogen and progesterone receptor negative, with an MIB-1 of 90%, and HER-2 pending. Incidentally the lymph node biopsy showed a lymphocytic inflammatory component but no lymph node architecture.  Her subsequent history is as detailed below.  PAST MEDICAL HISTORY: Past Medical History:  Diagnosis Date  . Anemia    hx of - during 1st round chemo  . Anxiety   . Arthritis    in fingers, shoulders  . Back pain   . Balance problem   . Breast cancer ([place])   . Breast cancer of upper-outer quadrant of right female breast ([place]) 07/22/2014  . Bunion, right foot   . Clumsiness   . Constipation   . Depression   . Diabetes mellitus without complication ([place])    27+ years  . Eating disorder    binge eating  . Epistaxis 08/26/2014  . Fatty liver   . HCAP (healthcare-associated pneumonia) 12/18/2014  . Headache   . Heart murmur    never had any problems  . History of radiation therapy 04/05/15-05/19/15   right chest wall was treated to 50.4 Gy in 28 fractions, right mastectomy scar was treated to 10 Gy in 5 fractions  . Hyperlipidemia   . Hypertension   . Joint pain   . Multiple food allergies   .  Neuromuscular disorder ([place])    diabetic neuropathy  . Obesity   . Obstructive sleep apnea on CPAP    uses cpap nightly  . Ovarian cyst rupture    possible  . Pneumonia   . Pneumonia 11/2014  . Radiation 04/05/15-05/19/15   right chest  wall 50.4 Gy, mastectomy scar 10 Gy  . Sleep apnea   . Thyroid nodule 06/2014    PAST SURGICAL HISTORY: Past Surgical History:  Procedure Laterality Date  . APPENDECTOMY    . BIOPSY THYROID Bilateral 06/2014   2 nodules - Benign   . BREAST LUMPECTOMY WITH RADIOACTIVE SEED AND SENTINEL LYMPH NODE BIOPSY Right 12/27/2014   Procedure: BREAST LUMPECTOMY WITH RADIOACTIVE SEED AND SENTINEL LYMPH NODE BIOPSY;  Surgeon: Stark Klein, MD;  Location: [place];  Service: General;  Laterality: Right;  . BREAST REDUCTION SURGERY Bilateral 01/06/2015   Procedure: BILATERAL BREAST REDUCTION, RIGHT ONCOPLASTIC RECONSTRUCTION),LEFT BREAST REDUCTION FOR SYMMETRY;  Surgeon: [name], MD;  Location: [place];  Service: Plastics;  Laterality: Bilateral;  . BREATH TEK H PYLORI N/A 09/15/2012   Procedure: BREATH TEK H PYLORI;  Surgeon: Pedro Earls, MD;  Location: [facility];  Service: General;  Laterality: N/A;  . BUNIONECTOMY Left 12/2013  . CARPAL TUNNEL RELEASE Right   . CARPAL TUNNEL RELEASE Left   . CATARACT EXTRACTION     6/19  . COLONOSCOPY    . DUPUYTREN CONTRACTURE RELEASE    . GASTRIC ROUX-EN-Y N/A 11/02/2012   Procedure: LAPAROSCOPIC ROUX-EN-Y GASTRIC;  Surgeon: Pedro Earls, MD;  Location: [facility];  Service: General;  Laterality: N/A;  . IR GENERIC HISTORICAL  03/18/2016   IR US GUIDE VASC ACCESS LEFT 03/18/2016 Markus Daft, MD [facility]  . IR GENERIC HISTORICAL  03/18/2016   IR FLUORO GUIDE CV LINE LEFT 03/18/2016 Markus Daft, MD [facility]  . LAPAROSCOPIC APPENDECTOMY N/A 07/22/2015   Procedure: APPENDECTOMY LAPAROSCOPIC;  Surgeon: Michael Boston, MD;  [facility];  Service: General;  Laterality: N/A;  . MASTECTOMY Right 02/15/2015   modified  . MODIFIED MASTECTOMY Right 02/15/2015   Procedure: RIGHT MODIFIED RADICAL MASTECTOMY;  Surgeon: Stark Klein, MD;  Location: [place];  Service: General;  Laterality: Right;  . PORT-A-CATH REMOVAL Left 10/10/2015   Procedure:  PORT REMOVAL;  Surgeon: Stark Klein, MD;  Location: [place];  Service: General;  Laterality: Left;  . PORTACATH PLACEMENT Left 08/02/2014   Procedure: INSERTION PORT-A-CATH;  Surgeon: Stark Klein, MD;  Location: [place];  Service: General;  Laterality: Left;  . PORTACATH PLACEMENT N/A 11/05/2016   Procedure: INSERTION PORT-A-CATH;  Surgeon: Stark Klein, MD;  Location: [place];  Service: General;  Laterality: N/A;  . ROTATOR CUFF REPAIR Right 2005  . SCAR REVISION Right 12/08/2015   Procedure: COMPLEX REPAIR RIGHT CHEST 10-15CM;  Surgeon: [name], MD;  Location: [place];  Service: Plastics;  Laterality: Right;  . TOE SURGERY  1970s   bone spur  . TRIGGER FINGER RELEASE     x3    FAMILY HISTORY Family History  Problem Relation Age of Onset  . CVA Mother   . Heart disease Mother   . Sudden death Mother   . Diabetes Father   . Heart failure Father   . Hypertension Father   . Kidney disease Father   . Obesity Father   . Hypertension Sister   . Diabetes Brother   . Breast cancer Paternal Grandmother 97  . Diabetes Paternal Uncle   .  Ovarian cancer Neg Hx    The patient's father died from complications of diabetes at age 37. The patient's mother died at age 30 with a subarachnoid hemorrhage. The patient had one brother, one sister. The only breast cancer was the patient's paternal grandmother diagnosed in her 80s. There is no history of ovarian cancer in the family.  GYNECOLOGIC HISTORY:  Patient's last menstrual period was 10/22/2007.  menarche age 45, the patient is GX P0. She went through the change of life in 2011. She did not take hormone replacement.  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 Her husband [name] is a retired Visual merchandiser (he taught at [employer]).  [name] has 2 children from an earlier  marriage, Rockey Situ who teaches in [place], and Anneke Cundy,  who works at the [employer] in in [place]. He has 1 grand son, aged 68. The patient and her husband are not church attenders.   ADVANCED DIRECTIVES:  Not in place   HEALTH MAINTENANCE: Social History   Tobacco Use  . Smoking status: Never Smoker  . Smokeless tobacco: Never Used  Substance Use Topics  . Alcohol use: Yes    Alcohol/week: 1.0 standard drinks    Types: 1 Standard drinks or equivalent per week    Comment: social  . Drug use: No     Colonoscopy:  May 2016  PAP:  Bone density: 07/01/2016 at [facility] showed normal, with a T score of -0.7  Lipid panel:  Allergies  Allergen Reactions  . Nsaids Other (See Comments)    H/o gastric bypass - avoid NSAIDs  . Tape Hives, Rash and Other (See Comments)    Adhesives in bandaids    Current Outpatient Medications  Medication Sig Dispense Refill  . acetaminophen (TYLENOL) 325 MG tablet Take 650 mg by mouth 2 (two) times daily as needed for moderate pain or headache.     [name] buPROPion (WELLBUTRIN SR) 200 MG 12 hr tablet TAKE 1 TABLET BY MOUTH DAILY AT 12 NOON. 30 tablet 0  . CALCIUM CITRATE-VITAMIN D PO Take 1 tablet by mouth 3 (three) times daily.     . cholecalciferol (VITAMIN D) 1000 units tablet Take 1,000 Units by mouth daily.    . Cyanocobalamin (VITAMIN B-12 PO) Take 1 tablet by mouth every 14 (fourteen) days.    . diphenhydrAMINE (BENADRYL) 25 MG tablet Take 25 mg by mouth daily as needed for allergies or sleep.     . Insulin Glargine (BASAGLAR KWIKPEN) 100 UNIT/ML SOPN INJECT 0.12 MLS (12 UNITS TOTAL) INTO THE SKIN DAILY. 5 pen 0  . Insulin Pen Needle (BD PEN NEEDLE NANO U/F) 32G X 4 MM MISC 1 Package by Does not apply route daily at 12 noon. 100 each 0  . liraglutide (VICTOZA) 18 MG/3ML SOPN INJECT 1.8MG(0.3 MLS) INTO THE SKIN EVERY MORNING 3 pen 0  . losartan (COZAAR) 100 MG tablet Take 1 tablet (100 mg total) by mouth daily. 30 tablet 0  . Melatonin  3 MG CAPS Take 3 mg by mouth at bedtime.     . metFORMIN (GLUCOPHAGE) 500 MG tablet Take 1 tablet (500 mg total) by mouth daily. 30 tablet 0  . ONETOUCH DELICA LANCETS 79X MISC 1 Package by Does not apply route daily. 100 each 0  . Pediatric Multivitamins-Iron (FLINTSTONES PLUS IRON) chewable tablet Chew 1 tablet by mouth 2 (two)  times daily.    . Probiotic Product ([employer]S PROBIOTIC PO) Take 1 capsule by mouth daily.     [name] Propylene Glycol (SYSTANE BALANCE OP) Place 1-2 drops into both eyes 3 (three) times daily as needed (for dry eyes).     [name] senna-docusate (SENNA PLUS) 8.6-50 MG tablet Take 1 tablet by mouth daily as needed for moderate constipation.     . topiramate (TOPAMAX) 25 MG tablet Take 1 tablet (25 mg total) by mouth daily. 30 tablet 0  . vortioxetine HBr (TRINTELLIX) 20 MG TABS Take 20 mg by mouth at bedtime. 30 tablet 0   No current facility-administered medications for this visit.     OBJECTIVE:  Vitals:   12/30/17 0851  BP: (!) 135/46  Pulse: 72  Resp: 18  Temp: 97.8 F (36.6 C)  SpO2: 97%     Body mass index is 29.47 kg/m.    ECOG FS:1 - Symptomatic but completely ambulatory Filed Weights   12/30/17 0851  Weight: 199 lb 9 oz (90.5 kg)  GENERAL: Patient is a well appearing female in no acute distress HEENT:  Sclerae anicteric.  Oropharynx clear and moist. No ulcerations or evidence of oropharyngeal candidiasis. Neck is supple.  NODES:  No cervical, supraclavicular, or axillary lymphadenopathy palpated.  BREAST EXAM:  Declined LUNGS:  Clear to auscultation bilaterally.  No wheezes or rhonchi. HEART:  Regular rate and rhythm. No murmur appreciated. ABDOMEN:  Soft, nontender.  Positive, normoactive bowel sounds. No organomegaly palpated. MSK:  No focal spinal tenderness to palpation. Full range of motion bilaterally in the upper extremities. EXTREMITIES:  No peripheral edema.   SKIN:  Clear with no obvious rashes or skin changes. No nail dyscrasia. NEURO:  Nonfocal. Well  oriented.  Appropriate affect.    LAB RESULTS:  CMP     Component Value Date/Time   NA 142 12/30/2017 0807   NA 143 03/11/2017 0841   NA 140 01/07/2017 0758   K 3.9 12/30/2017 0807   K 4.1 01/07/2017 0758   CL 112 (H) 12/30/2017 0807   CO2 21 (L) 12/30/2017 0807   CO2 24 01/07/2017 0758   GLUCOSE 182 (H) 12/30/2017 0807   GLUCOSE 134 01/07/2017 0758   BUN 31 (H) 12/30/2017 0807   BUN 40 (H) 03/11/2017 0841   BUN 33.6 (H) 01/07/2017 0758   CREATININE 1.66 (H) 12/30/2017 0807   CREATININE 1.74 (H) 12/16/2017 0806   CREATININE 1.5 (H) 01/07/2017 0758   CALCIUM 9.0 12/30/2017 0807   CALCIUM 9.3 01/07/2017 0758   PROT 6.5 12/30/2017 0807   PROT 6.9 03/11/2017 0841   PROT 6.7 01/07/2017 0758   ALBUMIN 3.6 12/30/2017 0807   ALBUMIN 4.5 03/11/2017 0841   ALBUMIN 3.6 01/07/2017 0758   AST 101 (H) 12/30/2017 0807   AST 43 (H) 12/16/2017 0806   AST 51 (H) 01/07/2017 0758   ALT 121 (H) 12/30/2017 0807   ALT 57 (H) 12/16/2017 0806   ALT 82 (H) 01/07/2017 0758   ALKPHOS 179 (H) 12/30/2017 0807   ALKPHOS 115 01/07/2017 0758   BILITOT 0.6 12/30/2017 0807   BILITOT 0.6 12/16/2017 0806   BILITOT 0.72 01/07/2017 0758   GFRNONAA 32 (L) 12/30/2017 0807   GFRNONAA 30 (L) 12/16/2017 0806   GFRNONAA 55 (L) 02/18/2013 0827   GFRAA 37 (L) 12/30/2017 0807   GFRAA 35 (L) 12/16/2017 0806   GFRAA 63 02/18/2013 0827    INo results found for: SPEP, UPEP  Lab Results  Component Value Date  WBC 3.4 (L) 12/30/2017   NEUTROABS 2.2 12/30/2017   HGB 10.5 (L) 12/30/2017   HCT 32.1 (L) 12/30/2017   MCV 92.2 12/30/2017   PLT 83 (L) 12/30/2017      Chemistry      Component Value Date/Time   NA 142 12/30/2017 0807   NA 143 03/11/2017 0841   NA 140 01/07/2017 0758   K 3.9 12/30/2017 0807   K 4.1 01/07/2017 0758   CL 112 (H) 12/30/2017 0807   CO2 21 (L) 12/30/2017 0807   CO2 24 01/07/2017 0758   BUN 31 (H) 12/30/2017 0807   BUN 40 (H) 03/11/2017 0841   BUN 33.6 (H) 01/07/2017 0758    CREATININE 1.66 (H) 12/30/2017 0807   CREATININE 1.74 (H) 12/16/2017 0806   CREATININE 1.5 (H) 01/07/2017 0758      Component Value Date/Time   CALCIUM 9.0 12/30/2017 0807   CALCIUM 9.3 01/07/2017 0758   ALKPHOS 179 (H) 12/30/2017 0807   ALKPHOS 115 01/07/2017 0758   AST 101 (H) 12/30/2017 0807   AST 43 (H) 12/16/2017 0806   AST 51 (H) 01/07/2017 0758   ALT 121 (H) 12/30/2017 0807   ALT 57 (H) 12/16/2017 0806   ALT 82 (H) 01/07/2017 0758   BILITOT 0.6 12/30/2017 0807   BILITOT 0.6 12/16/2017 0806   BILITOT 0.72 01/07/2017 0758       No results found for: LABCA2  No components found for: EGBTD176  No results for input(s): INR in the last 168 hours.  Urinalysis    Component Value Date/Time   COLORURINE YELLOW 07/21/2015 [place] 07/21/2015 2328   LABSPEC 1.020 07/21/2015 2328   PHURINE 6.0 07/21/2015 2328   GLUCOSEU NEGATIVE 07/21/2015 2328   HGBUR TRACE (A) 07/21/2015 [place] 07/21/2015 2328   BILIRUBINUR neg 06/29/2013 [place] 07/21/2015 2328   PROTEINUR 30 (A) 07/21/2015 2328   UROBILINOGEN negative 06/29/2013 1017   NITRITE NEGATIVE 07/21/2015 2328   LEUKOCYTESUR NEGATIVE 07/21/2015 2328    STUDIES: Nm Pet Image Restag (ps) Skull Base To Thigh  Result Date: 12/09/2017 CLINICAL DATA:  Subsequent treatment strategy for metastatic adenocarcinoma of occult primary tumor. EXAM: NUCLEAR MEDICINE PET SKULL BASE TO THIGH TECHNIQUE: 9.64 mCi F-18 FDG was injected intravenously. Full-ring PET imaging was performed from the skull base to thigh after the radiotracer. CT data was obtained and used for attenuation correction and anatomic localization. Fasting blood glucose: 81 mg/dl COMPARISON:  07/10/2017 FINDINGS: Mediastinal blood pool activity: SUV max 2.76 NECK: No hypermetabolic lymph nodes in the neck. Incidental CT findings: none CHEST: No hypermetabolic supraclavicular lymph nodes. Within the right subpectoral  region there is a 9 mm lymph node within SUV max of 5.3. New from previous exam. No hypermetabolic mediastinal or hilar lymph nodes.No hypermetabolic pulmonary nodules or masses. Incidental CT findings: Aortic atherosclerosis. Calcification in the RCA, LAD and left circumflex coronary arteries noted. Post treatment changes from right mastectomy and right axillary nodal dissection again noted. ABDOMEN/PELVIS: 2.5 cm right lobe of liver hypermetabolic lesion is again noted. SUV max equals 20.3. Previously this measured 1.7 cm and had an SUV max of 9.88. No additional liver lesions identified. No abnormal uptake within the pancreas or spleen. Unremarkable appearance of the adrenal glands. No hypermetabolic lymph nodes identified within the abdomen. No hypermetabolic pelvic lymph nodes. Incidental CT findings: Postop changes from gastric bypass surgery noted. Gallstones. Aortic atherosclerosis. No aneurysm. Prominent upper abdominal lymph nodes are again noted  without significant FDG uptake. For example peripancreatic lymph node measures 1.3 cm and has an SUV max of 2.77. Unchanged from previous exam. Portacaval node measures 1.4 cm and has an SUV max of 2.87. Unchanged from previous exam. Calcified uterine fibroids. SKELETON: No focal hypermetabolic activity to suggest skeletal metastasis. Incidental CT findings: Degenerative changes are noted involving the left sternoclavicular joint in bilateral acromioclavicular joints with corresponding increased uptake indicating inflammation. IMPRESSION: 1. Right lobe of liver metastasis has increased in size and degree of FDG uptake compared with previous exam. 2. Recurrent hypermetabolic lymph node within the right subpectoral region. 3. Mild upper abdominal adenopathy without significant FDG uptake. Unchanged. 4.  Aortic Atherosclerosis (ICD10-I70.0). 5. Coronary artery atherosclerotic calcifications 6. Gallstones. Electronically Signed   By: Kerby Moors M.D.   On:  12/09/2017 16:41     ASSESSMENT: 67 y.o.  woman status post right breast upper outer quadrant and right axillary lymph node biopsy 07/19/2014 for a cT2  pN1, stage IIB  invasive ductal carcinoma, grade 2, estrogen and progesterone receptor negative, with an MIB-1 of 90%, and HER-2 amplified  (1) neoadjuvant chemotherapy started 08/08/2014, consisting of carboplatin, docetaxel, trastuzumab and pertuzumab given every 3 weeks 6, completed 11/21/2014  (a) breast MRI after initial 3 cycles shows a significant response  (b) breast MRI after 6 cycles shows a complete radiologic response   (2) trastuzumab continued through 10/02/2015 (to end of capecitabine therapy)  (a) echo 02/13/2015 shows an EF of 60-65%  (b)  echo 05/09/2015 shows an ejection fraction of 60-65%  (c) echocardiogram 06/27/2015 shows an ejection fraction of 55-60%.  (d) echocardiogram 01/22/2017 shows an ejection fraction of 55-60%  (3) right lumpectomy with sentinel lymph node biopsy on 12/27/14 showed a residual  ypT2 ypN1a, stage IIB invasive ductal carcinoma, grade 2, with repeat estrogen and progesterone receptors again negative  (4) status post right oncoplastic surgery (with left reduction mammoplasty) 01/06/2015 showing in the right breast multiple foci of intralymphatic carcinoma in random sections  (5)  Right modified radical mastectomy 02/15/2014 showed no residual invasive disease; there was DCIS but margins were negative; all 15 axillary lymph nodes were clear; ypTis ypN0  (a)  The patient has decided against reconstruction.  (6)  Postmastectomy radiation completed 05/18/2015 with capecitabine sensitization  (7) adjuvant capecitabine continued through 09/27/2015  (8) hepatic steatosis with increased fibrosis score (at risk for cirrhosis)  (9) thyroid biopsy (left lobe inferiorly) 2 shows benign tissue 06/28/2015  (10) LOCAL RECURRENCE:  (a) PET scan 12/06/2015 Notes a right retropectoral lymph node  measuring 0.9 cm with an SUV max of 5.5  (b) repeat PET scan 03/11/2016 finds the right subpectoral lymph node to now measure 1.3 cm with an SUV max of 11.8.  (c) PET scan 05/07/2016 shows a significant response to T-DM 1  (d) PET scan 07/10/2017 is clear except for what appears to be a single new liver lesion   (11) started T-DM1 03/21/2016, completed 8 doses 08/13/2016  (a) PET scan 08/19/2016 shows a good response but measurable residual disease  (b) PET scan on 01/16/2017 shows a continuing response  (12) maintenance trastuzumab started 09/03/2016--changed to every 28 days as of 01/28/2017  (a) repeat echocardiogram 07/08/2016 shows an ejection fraction of 60-65%.  (b) echocardiogram on 10/08/2016 demonstrates LVEF of 55-60% (followed by Dr. Haroldine Laws)  (c) echo 01/22/2017 shows an ejection fraction in the 55-60% range  (d) echo 04/24/2017 shows an ejection fraction in the 55-60% range (recommendation for echo every 6 months by  Dr. Aundra Dubin)  (e) trastuzumab changed to T-DM 1 after trastuzumab dose 07/15/2017  METASTATIC DISEASE: July 2019 (13)  Radiation to the area of persistent disease in the right upper chest completed 10/30/2016   (14) T-DM 1 resumed 08/12/2017, repeated every 21 days  (a) baseline liver MRI 08/02/2017 shows a 1.4 cm mass, "hot" on PET 07/10/2017  (b) repeat liver MRI 10/22/2018 3:19 cycles of T-DM 1, shows slight enlargement  (c) T-DM 1 discontinued after 09/23/2017 dose  (15) trastuzumab resumed 11/18/2017, to be repeated every 4 weeks  (16) liver biopsy 11/03/2017 shows adenocarcinoma, most consistent with an upper gastrointestinal primary; this tumor was estrogen receptor and HER-2 negative  (a) GI workup for occult primary ([place]) negative  (b) PET 12/09/2017 shows no obvious primary.  There is a right subpectoral lymph node noted as well  (c) CA 27-29 and CA 19-9 uninformative; CEA pending   (17) to start Abraxane on 12/10, given days 1 and 8 on a 21 day  cycle x 3, then restaging with MRI liver 02/24/2018 tentatively  (18) consider Yttrium or other local treatment to liver per IR  PLAN:  Darlene [name] is doing well today.  She and I reviewed her Abraxane today including potential side effects.  We reviewed how to take anti emetics.  She has no questions today.  Unfortunately, she does not have Abraxane orders, and I called Dee in our pre authorization department.  She met with Dr. Jana Hakim separately who talked with her (see his addendum).  She will return tomorrow for treatment.     Wilber Bihari, NP  12/30/17 1:05 PM Medical Oncology and Hematology [facility] [address] [place], [place] [address] Tel. [phone]    Fax. [phone]   ADDENDUM: I have apologized to [facility] for not having entered the Abraxane orders earlier.  We tried to get them approved today but that was really not realistic and she will be treated tomorrow.  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  She is riding her congressman and I think that is appropriate.  She is also working with a Glass blower/designer and with [facility] trying to meet their demands in good faith  She will see me again in a week.  If she tolerates this treatment well the plan will be to do 3-4 cycles and then restage.  At some point then we would consider local treatment for the liver lesions.  I personally saw this patient and performed a substantive portion of this encounter with the listed APP documented above.   Chauncey Cruel, MD Medical Oncology and Hematology [facility] [address] [place], [place] [address] Tel. [phone]    Fax. [phone]

## 2017-12-30 ENCOUNTER — Inpatient Hospital Stay: Payer: Medicare Other | Admitting: Adult Health

## 2017-12-30 ENCOUNTER — Other Ambulatory Visit: Payer: Self-pay | Admitting: Oncology

## 2017-12-30 ENCOUNTER — Inpatient Hospital Stay: Payer: Medicare Other | Attending: Oncology

## 2017-12-30 ENCOUNTER — Inpatient Hospital Stay: Payer: Medicare Other

## 2017-12-30 ENCOUNTER — Encounter: Payer: Self-pay | Admitting: Adult Health

## 2017-12-30 VITALS — BP 135/46 | HR 72 | Temp 97.8°F | Resp 18 | Ht 69.0 in | Wt 199.6 lb

## 2017-12-30 DIAGNOSIS — Z171 Estrogen receptor negative status [ER-]: Secondary | ICD-10-CM

## 2017-12-30 DIAGNOSIS — C50411 Malignant neoplasm of upper-outer quadrant of right female breast: Secondary | ICD-10-CM | POA: Insufficient documentation

## 2017-12-30 DIAGNOSIS — Z5189 Encounter for other specified aftercare: Secondary | ICD-10-CM | POA: Insufficient documentation

## 2017-12-30 DIAGNOSIS — C787 Secondary malignant neoplasm of liver and intrahepatic bile duct: Secondary | ICD-10-CM

## 2017-12-30 DIAGNOSIS — Z452 Encounter for adjustment and management of vascular access device: Secondary | ICD-10-CM | POA: Diagnosis not present

## 2017-12-30 DIAGNOSIS — Z95828 Presence of other vascular implants and grafts: Secondary | ICD-10-CM

## 2017-12-30 DIAGNOSIS — Z5112 Encounter for antineoplastic immunotherapy: Secondary | ICD-10-CM | POA: Insufficient documentation

## 2017-12-30 DIAGNOSIS — C7989 Secondary malignant neoplasm of other specified sites: Secondary | ICD-10-CM

## 2017-12-30 DIAGNOSIS — Z5111 Encounter for antineoplastic chemotherapy: Secondary | ICD-10-CM | POA: Diagnosis present

## 2017-12-30 DIAGNOSIS — C50911 Malignant neoplasm of unspecified site of right female breast: Secondary | ICD-10-CM

## 2017-12-30 DIAGNOSIS — Z7189 Other specified counseling: Secondary | ICD-10-CM | POA: Insufficient documentation

## 2017-12-30 DIAGNOSIS — C801 Malignant (primary) neoplasm, unspecified: Secondary | ICD-10-CM

## 2017-12-30 LAB — CBC WITH DIFFERENTIAL/PLATELET
Abs Immature Granulocytes: 0.01 10*3/uL (ref 0.00–0.07)
Basophils Absolute: 0 10*3/uL (ref 0.0–0.1)
Basophils Relative: 0 %
Eosinophils Absolute: 0.1 10*3/uL (ref 0.0–0.5)
Eosinophils Relative: 3 %
HCT: 32.1 % — ABNORMAL LOW (ref 36.0–46.0)
Hemoglobin: 10.5 g/dL — ABNORMAL LOW (ref 12.0–15.0)
Immature Granulocytes: 0 %
Lymphocytes Relative: 24 %
Lymphs Abs: 0.8 10*3/uL (ref 0.7–4.0)
MCH: 30.2 pg (ref 26.0–34.0)
MCHC: 32.7 g/dL (ref 30.0–36.0)
MCV: 92.2 fL (ref 80.0–100.0)
Monocytes Absolute: 0.3 10*3/uL (ref 0.1–1.0)
Monocytes Relative: 8 %
Neutro Abs: 2.2 10*3/uL (ref 1.7–7.7)
Neutrophils Relative %: 65 %
Platelets: 83 10*3/uL — ABNORMAL LOW (ref 150–400)
RBC: 3.48 MIL/uL — ABNORMAL LOW (ref 3.87–5.11)
RDW: 14 % (ref 11.5–15.5)
WBC: 3.4 10*3/uL — ABNORMAL LOW (ref 4.0–10.5)
nRBC: 0 % (ref 0.0–0.2)

## 2017-12-30 LAB — COMPREHENSIVE METABOLIC PANEL
ALT: 121 U/L — ABNORMAL HIGH (ref 0–44)
AST: 101 U/L — ABNORMAL HIGH (ref 15–41)
Albumin: 3.6 g/dL (ref 3.5–5.0)
Alkaline Phosphatase: 179 U/L — ABNORMAL HIGH (ref 38–126)
Anion gap: 9 (ref 5–15)
BUN: 31 mg/dL — ABNORMAL HIGH (ref 8–23)
CO2: 21 mmol/L — ABNORMAL LOW (ref 22–32)
Calcium: 9 mg/dL (ref 8.9–10.3)
Chloride: 112 mmol/L — ABNORMAL HIGH (ref 98–111)
Creatinine, Ser: 1.66 mg/dL — ABNORMAL HIGH (ref 0.44–1.00)
GFR calc Af Amer: 37 mL/min — ABNORMAL LOW (ref >60)
GFR calc non Af Amer: 32 mL/min — ABNORMAL LOW (ref >60)
Glucose, Bld: 182 mg/dL — ABNORMAL HIGH (ref 70–99)
Potassium: 3.9 mmol/L (ref 3.5–5.1)
Sodium: 142 mmol/L (ref 135–145)
Total Bilirubin: 0.6 mg/dL (ref 0.3–1.2)
Total Protein: 6.5 g/dL (ref 6.5–8.1)

## 2017-12-30 LAB — CEA (IN HOUSE-CHCC): CEA (CHCC-In House): 14.85 ng/mL — ABNORMAL HIGH (ref 0.00–5.00)

## 2017-12-30 MED ORDER — SODIUM CHLORIDE 0.9% FLUSH
10.0000 mL | Freq: Once | INTRAVENOUS | Status: AC
  Administered 2017-12-30: 10 mL via INTRAVENOUS
  Filled 2017-12-30: qty 10

## 2017-12-30 MED ORDER — SODIUM CHLORIDE 0.9 % IJ SOLN: 10.0000 mL | INTRAMUSCULAR | Status: DC | PRN

## 2017-12-30 MED ORDER — PROCHLORPERAZINE MALEATE 10 MG PO TABS: 10.0000 mg | ORAL_TABLET | Freq: Four times a day (QID) | ORAL | 0 refills | Status: DC | PRN

## 2017-12-30 NOTE — Patient Instructions (Signed)

## 2017-12-30 NOTE — Progress Notes (Signed)
DISCONTINUE OFF PATHWAY REGIMEN - Breast   [id number]:Ado-Trastuzumab Emtansine q21 Days:   A cycle is every 21 days:     Ado-trastuzumab emtansine        Dose Mod: None  **Always confirm dose/schedule in your pharmacy ordering system**  REASON: Other Reason PRIOR TREATMENT: Off Pathway: Ado-Trastuzumab Emtansine q21 Days TREATMENT RESPONSE: Unable to Evaluate  Breast - No Medical Intervention - Off Treatment.  Patient Characteristics: Distant Metastases or Locoregional Recurrent Disease - Unresected or Locally Advanced Unresectable Disease Progressing after Neoadjuvant and Local Therapies, HER2 Negative/Unknown/Equivocal, ER Negative/Unknown, Chemotherapy, First Line, ER Negative,  PD-L1 Expression Negative/Unknown Therapeutic Status: Distant Metastases BRCA Mutation Status: Did Not Order Test ER Status: Negative (-) HER2 Status: Negative (-) PR Status: Negative (-) Line of Therapy: First Line PD-L1 Expression Status: Did Not Order Test

## 2017-12-30 NOTE — Progress Notes (Signed)
DISCONTINUE ON PATHWAY REGIMEN - Breast  No Medical Intervention - Off Treatment.  REASON: Other Reason PRIOR TREATMENT: Off Treatment  Breast - No Medical Intervention - Off Treatment.  Patient Characteristics: Distant Metastases or Locoregional Recurrent Disease - Unresected or Locally Advanced Unresectable Disease Progressing after Neoadjuvant and Local Therapies, HER2 Negative/Unknown/Equivocal, ER Negative/Unknown, Chemotherapy, First Line, ER Negative,  PD-L1 Expression Negative/Unknown Therapeutic Status: Distant Metastases BRCA Mutation Status: Did Not Order Test ER Status: Negative (-) HER2 Status: Negative (-) PR Status: Negative (-) Line of Therapy: First Line PD-L1 Expression Status: Did Not Order Test

## 2017-12-31 ENCOUNTER — Inpatient Hospital Stay: Payer: Medicare Other

## 2017-12-31 VITALS — BP 151/61 | HR 64 | Temp 97.7°F | Resp 16

## 2017-12-31 DIAGNOSIS — Z5112 Encounter for antineoplastic immunotherapy: Secondary | ICD-10-CM | POA: Diagnosis not present

## 2017-12-31 DIAGNOSIS — C50411 Malignant neoplasm of upper-outer quadrant of right female breast: Secondary | ICD-10-CM

## 2017-12-31 DIAGNOSIS — Z171 Estrogen receptor negative status [ER-]: Secondary | ICD-10-CM

## 2017-12-31 MED ORDER — PROCHLORPERAZINE MALEATE 10 MG PO TABS
10.0000 mg | ORAL_TABLET | Freq: Once | ORAL | Status: AC
  Administered 2017-12-31: 10 mg via ORAL

## 2017-12-31 MED ORDER — SODIUM CHLORIDE 0.9 % IV SOLN
Freq: Once | INTRAVENOUS | Status: AC
  Administered 2017-12-31: 08:00:00 via INTRAVENOUS
  Filled 2017-12-31: qty 250

## 2017-12-31 MED ORDER — HEPARIN SOD (PORK) LOCK FLUSH 100 UNIT/ML IV SOLN
500.0000 [IU] | Freq: Once | INTRAVENOUS | Status: AC | PRN
  Administered 2017-12-31: 500 [IU]
  Filled 2017-12-31: qty 5

## 2017-12-31 MED ORDER — PACLITAXEL PROTEIN-BOUND CHEMO INJECTION 100 MG
100.0000 mg/m2 | Freq: Once | INTRAVENOUS | Status: AC
  Administered 2017-12-31: 200 mg via INTRAVENOUS
  Filled 2017-12-31: qty 40

## 2017-12-31 MED ORDER — SODIUM CHLORIDE 0.9% FLUSH
10.0000 mL | INTRAVENOUS | Status: DC | PRN
  Administered 2017-12-31: 10 mL
  Filled 2017-12-31: qty 10

## 2017-12-31 MED ORDER — PROCHLORPERAZINE MALEATE 10 MG PO TABS
ORAL_TABLET | ORAL | Status: AC
  Filled 2017-12-31: qty 1

## 2017-12-31 NOTE — Patient Instructions (Signed)
[facility] Discharge Instructions for Patients Receiving Chemotherapy  Today you received the following chemotherapy agents abraxane.   To help prevent nausea and vomiting after your treatment, we encourage you to take your nausea medication as directed.   If you develop nausea and vomiting that is not controlled by your nausea medication, call the clinic.   BELOW ARE SYMPTOMS THAT SHOULD BE REPORTED IMMEDIATELY:  *FEVER GREATER THAN 100.5 F  *CHILLS WITH OR WITHOUT FEVER  NAUSEA AND VOMITING THAT IS NOT CONTROLLED WITH YOUR NAUSEA MEDICATION  *UNUSUAL SHORTNESS OF BREATH  *UNUSUAL BRUISING OR BLEEDING  TENDERNESS IN MOUTH AND THROAT WITH OR WITHOUT PRESENCE OF ULCERS  *URINARY PROBLEMS  *BOWEL PROBLEMS  UNUSUAL RASH Items with * indicate a potential emergency and should be followed up as soon as possible.  Feel free to call the clinic you have any questions or concerns. The clinic phone number is [phone].  

## 2017-12-31 NOTE — Progress Notes (Signed)
Per Dr. Jana Hakim, ok to treat with Plts 83, Scr 1.66, AST 101 and ALT 121.

## 2018-01-01 ENCOUNTER — Telehealth: Payer: Self-pay | Admitting: Emergency Medicine

## 2018-01-01 NOTE — Telephone Encounter (Signed)
First time chemo follow up call.  No answer.  Left VM asking pt to call back with any questions or concerns. 

## 2018-01-01 NOTE — Telephone Encounter (Signed)
-----   Message from Arty Baumgartner, RN sent at 12/31/2017  3:34 PM EST ----- Regarding: Magrinat, first time chemo  First time abraxane.  Tolerated well.  Dr. Jana Hakim.

## 2018-01-05 ENCOUNTER — Encounter (INDEPENDENT_AMBULATORY_CARE_PROVIDER_SITE_OTHER): Payer: Self-pay | Admitting: Family Medicine

## 2018-01-05 ENCOUNTER — Ambulatory Visit (INDEPENDENT_AMBULATORY_CARE_PROVIDER_SITE_OTHER): Payer: Medicare Other | Admitting: Family Medicine

## 2018-01-05 VITALS — BP 120/57 | HR 67 | Ht 69.0 in | Wt 194.0 lb

## 2018-01-05 DIAGNOSIS — E669 Obesity, unspecified: Secondary | ICD-10-CM | POA: Diagnosis not present

## 2018-01-05 DIAGNOSIS — F3289 Other specified depressive episodes: Secondary | ICD-10-CM | POA: Diagnosis not present

## 2018-01-05 DIAGNOSIS — Z6830 Body mass index (BMI) 30.0-30.9, adult: Secondary | ICD-10-CM

## 2018-01-05 NOTE — Progress Notes (Signed)
Office: [phone]  /  Fax: [phone]   HPI:   Chief Complaint: OBESITY Darlene [name] is here to discuss her progress with her obesity treatment plan. She is keeping a food journal with 1300 to 1500 calories and 80+ grams of protein and is following her eating plan approximately 60 % of the time. She states she is exercising 0 minutes 0 times per week. Darlene [name] continues to do well with weight loss even with extra food challenges. Her husband is showing signs of major depressive disorder and this is bothering her. She had her 1st chemo treatment last week and tolerated it well. She notes increased comfort eating especially with friends bringing over food and desserts.  Her weight is 194 lb (88 kg) today and has had a weight loss of 1 pound over a period of 2 weeks since her last visit. She has lost 31 lbs since starting treatment with [name].  Depression with emotional eating behaviors Darlene [name] is struggling with emotional eating and using food for comfort to the extent that it is negatively impacting her health. She often snacks when she is not hungry. Darlene [name] sometimes feels she is out of control and then feels guilty that she made poor food choices. She has been working on behavior modification techniques to help reduce her emotional eating and has been somewhat successful. Her mood is stable overall. She is dealing appropriately with her recent metastatic breast cancer diagnosis, but is extra stressed with her husband's mood and behavior. She shows no sign of suicidal or homicidal ideations.  ASSESSMENT AND PLAN:  Other depression - with emotional eating  Class 1 obesity with serious comorbidity and body mass index (BMI) of 30.0 to 30.9 in adult, unspecified obesity type - Starting BMI greater then 30  PLAN:  Depression with Emotional Eating Behaviors Darlene [name] was encouraged to make a couples counseling session to help both of them work through these issues. We discussed behavior  modification techniques today to help Darlene [name] deal with her emotional eating and depression. She has agreed to continue her Wellbutrin 200mg  qd and agreed to follow up as directed in 3 weeks.  I spent > than 50% of the 25 minute visit on counseling as documented in the note.  Obesity Darlene [name] is currently in the action stage of change. As such, her goal is to continue with weight loss efforts. She has agreed to keep a food journal with 1300 to 1500 calories and 80+ grams of protein.  Darlene [name] has been instructed to work up to a goal of 150 minutes of combined cardio and strengthening exercise per week for weight loss and overall health benefits. We discussed the following Behavioral Modification Strategies today: increasing lean protein intake, decreasing simple carbohydrates, and emotional eating strategies.  Darlene [name] has agreed to follow up with our clinic in 3 weeks. She was informed of the importance of frequent follow up visits to maximize her success with intensive lifestyle modifications for her multiple health conditions.  ALLERGIES: Allergies  Allergen Reactions  . Nsaids Other (See Comments)    H/o gastric bypass - avoid NSAIDs  . Tape Hives, Rash and Other (See Comments)    Adhesives in bandaids    MEDICATIONS: Current Outpatient Medications on File Prior to Visit  Medication Sig Dispense Refill  . acetaminophen (TYLENOL) 325 MG tablet Take 650 mg by mouth 2 (two) times daily as needed for moderate pain or headache.     [name] buPROPion (WELLBUTRIN SR) 200 MG 12 hr tablet TAKE 1 TABLET  BY MOUTH DAILY AT 12 NOON. 30 tablet 0  . CALCIUM CITRATE-VITAMIN D PO Take 1 tablet by mouth 3 (three) times daily.     . cholecalciferol (VITAMIN D) 1000 units tablet Take 1,000 Units by mouth daily.    . Cyanocobalamin (VITAMIN B-12 PO) Take 1 tablet by mouth every 14 (fourteen) days.    . diphenhydrAMINE (BENADRYL) 25 MG tablet Take 25 mg by mouth daily as needed for allergies or sleep.     .  Insulin Glargine (BASAGLAR KWIKPEN) 100 UNIT/ML SOPN INJECT 0.12 MLS (12 UNITS TOTAL) INTO THE SKIN DAILY. 5 pen 0  . Insulin Pen Needle (BD PEN NEEDLE NANO U/F) 32G X 4 MM MISC 1 Package by Does not apply route daily at 12 noon. 100 each 0  . liraglutide (VICTOZA) 18 MG/3ML SOPN INJECT 1.8MG (0.3 MLS) INTO THE SKIN EVERY MORNING 3 pen 0  . losartan (COZAAR) 100 MG tablet Take 1 tablet (100 mg total) by mouth daily. 30 tablet 0  . Melatonin 3 MG CAPS Take 3 mg by mouth at bedtime.     . metFORMIN (GLUCOPHAGE) 500 MG tablet Take 1 tablet (500 mg total) by mouth daily. 30 tablet 0  . ONETOUCH DELICA LANCETS 30Q MISC 1 Package by Does not apply route daily. 100 each 0  . Pediatric Multivitamins-Iron (FLINTSTONES PLUS IRON) chewable tablet Chew 1 tablet by mouth 2 (two) times daily.    . Probiotic Product ([employer] PROBIOTIC PO) Take 1 capsule by mouth daily.     . prochlorperazine (COMPAZINE) 10 MG tablet Take 1 tablet (10 mg total) by mouth every 6 (six) hours as needed for nausea or vomiting. 30 tablet 0  . Propylene Glycol (SYSTANE BALANCE OP) Place 1-2 drops into both eyes 3 (three) times daily as needed (for dry eyes).     [name] senna-docusate (SENNA PLUS) 8.6-50 MG tablet Take 1 tablet by mouth daily as needed for moderate constipation.     . topiramate (TOPAMAX) 25 MG tablet Take 1 tablet (25 mg total) by mouth daily. 30 tablet 0  . vortioxetine HBr (TRINTELLIX) 20 MG TABS Take 20 mg by mouth at bedtime. 30 tablet 0   No current facility-administered medications on file prior to visit.     PAST MEDICAL HISTORY: Past Medical History:  Diagnosis Date  . Anemia    hx of - during 1st round chemo  . Anxiety   . Arthritis    in fingers, shoulders  . Back pain   . Balance problem   . Breast cancer ([place])   . Breast cancer of upper-outer quadrant of right female breast ([place]) 07/22/2014  . Bunion, right foot   . Clumsiness   . Constipation   . Depression   . Diabetes mellitus without complication  ([place])    76+ years  . Eating disorder    binge eating  . Epistaxis 08/26/2014  . Fatty liver   . HCAP (healthcare-associated pneumonia) 12/18/2014  . Headache   . Heart murmur    never had any problems  . History of radiation therapy 04/05/15-05/19/15   right chest wall was treated to 50.4 Gy in 28 fractions, right mastectomy scar was treated to 10 Gy in 5 fractions  . Hyperlipidemia   . Hypertension   . Joint pain   . Multiple food allergies   . Neuromuscular disorder ([place])    diabetic neuropathy  . Obesity   . Obstructive sleep apnea on CPAP    uses cpap nightly  .  Ovarian cyst rupture    possible  . Pneumonia   . Pneumonia 11/2014  . Radiation 04/05/15-05/19/15   right chest wall 50.4 Gy, mastectomy scar 10 Gy  . Sleep apnea   . Thyroid nodule 06/2014    PAST SURGICAL HISTORY: Past Surgical History:  Procedure Laterality Date  . APPENDECTOMY    . BIOPSY THYROID Bilateral 06/2014   2 nodules - Benign   . BREAST LUMPECTOMY WITH RADIOACTIVE SEED AND SENTINEL LYMPH NODE BIOPSY Right 12/27/2014   Procedure: BREAST LUMPECTOMY WITH RADIOACTIVE SEED AND SENTINEL LYMPH NODE BIOPSY;  Surgeon: Stark Klein, MD;  Location: [place];  Service: General;  Laterality: Right;  . BREAST REDUCTION SURGERY Bilateral 01/06/2015   Procedure: BILATERAL BREAST REDUCTION, RIGHT ONCOPLASTIC RECONSTRUCTION),LEFT BREAST REDUCTION FOR SYMMETRY;  Surgeon: [name], MD;  Location: [place];  Service: Plastics;  Laterality: Bilateral;  . BREATH TEK H PYLORI N/A 09/15/2012   Procedure: BREATH TEK H PYLORI;  Surgeon: Pedro Earls, MD;  Location: [facility];  Service: General;  Laterality: N/A;  . BUNIONECTOMY Left 12/2013  . CARPAL TUNNEL RELEASE Right   . CARPAL TUNNEL RELEASE Left   . CATARACT EXTRACTION     6/19  . COLONOSCOPY    . DUPUYTREN CONTRACTURE RELEASE    . GASTRIC ROUX-EN-Y N/A 11/02/2012   Procedure: LAPAROSCOPIC ROUX-EN-Y GASTRIC;  Surgeon: Pedro Earls, MD;   Location: [facility];  Service: General;  Laterality: N/A;  . IR GENERIC HISTORICAL  03/18/2016   IR US GUIDE VASC ACCESS LEFT 03/18/2016 Markus Daft, MD [facility]  . IR GENERIC HISTORICAL  03/18/2016   IR FLUORO GUIDE CV LINE LEFT 03/18/2016 Markus Daft, MD [facility]  . LAPAROSCOPIC APPENDECTOMY N/A 07/22/2015   Procedure: APPENDECTOMY LAPAROSCOPIC;  Surgeon: Michael Boston, MD;  Location: [facility];  Service: General;  Laterality: N/A;  . MASTECTOMY Right 02/15/2015   modified  . MODIFIED MASTECTOMY Right 02/15/2015   Procedure: RIGHT MODIFIED RADICAL MASTECTOMY;  Surgeon: Stark Klein, MD;  Location: [place];  Service: General;  Laterality: Right;  . PORT-A-CATH REMOVAL Left 10/10/2015   Procedure: PORT REMOVAL;  Surgeon: Stark Klein, MD;  Location: [place];  Service: General;  Laterality: Left;  . PORTACATH PLACEMENT Left 08/02/2014   Procedure: INSERTION PORT-A-CATH;  Surgeon: Stark Klein, MD;  Location: [place];  Service: General;  Laterality: Left;  . PORTACATH PLACEMENT N/A 11/05/2016   Procedure: INSERTION PORT-A-CATH;  Surgeon: Stark Klein, MD;  Location: [place];  Service: General;  Laterality: N/A;  . ROTATOR CUFF REPAIR Right 2005  . SCAR REVISION Right 12/08/2015   Procedure: COMPLEX REPAIR RIGHT CHEST 10-15CM;  Surgeon: [name], MD;  Location: [place];  Service: Plastics;  Laterality: Right;  . TOE SURGERY  1970s   bone spur  . TRIGGER FINGER RELEASE     x3    SOCIAL HISTORY: Social History   Tobacco Use  . Smoking status: Never Smoker  . Smokeless tobacco: Never Used  Substance Use Topics  . Alcohol use: Yes    Alcohol/week: 1.0 standard drinks    Types: 1 Standard drinks or equivalent per week    Comment: social  . Drug use: No    FAMILY HISTORY: Family History  Problem Relation Age of Onset  . CVA Mother   . Heart disease Mother   . Sudden death Mother   . Diabetes Father   . Heart failure Father   . Hypertension Father     .  Kidney disease Father   . Obesity Father   . Hypertension Sister   . Diabetes Brother   . Breast cancer Paternal Grandmother 55  . Diabetes Paternal Uncle   . Ovarian cancer Neg Hx     ROS: Review of Systems  Constitutional: Positive for weight loss.  Psychiatric/Behavioral: Positive for depression. Negative for suicidal ideas.       Negative for homicidal ideations.    PHYSICAL EXAM: Blood pressure (!) 120/57, pulse 67, height 5\' 9"  (1.753 m), weight 194 lb (88 kg), last menstrual period 10/22/2007, SpO2 100 %. Body mass index is 28.65 kg/m. Physical Exam Vitals signs reviewed.  Constitutional:      Appearance: Normal appearance. She is obese.  Cardiovascular:     Rate and Rhythm: Normal rate.  Pulmonary:     Effort: Pulmonary effort is normal.  Musculoskeletal: Normal range of motion.  Skin:    General: Skin is warm and dry.  Neurological:     Mental Status: She is alert and oriented to person, place, and time.  Psychiatric:        Mood and Affect: Mood normal.        Behavior: Behavior normal.     RECENT LABS AND TESTS: BMET    Component Value Date/Time   NA 142 12/30/2017 0807   NA 143 03/11/2017 0841   NA 140 01/07/2017 0758   K 3.9 12/30/2017 0807   K 4.1 01/07/2017 0758   CL 112 (H) 12/30/2017 0807   CO2 21 (L) 12/30/2017 0807   CO2 24 01/07/2017 0758   GLUCOSE 182 (H) 12/30/2017 0807   GLUCOSE 134 01/07/2017 0758   BUN 31 (H) 12/30/2017 0807   BUN 40 (H) 03/11/2017 0841   BUN 33.6 (H) 01/07/2017 0758   CREATININE 1.66 (H) 12/30/2017 0807   CREATININE 1.74 (H) 12/16/2017 0806   CREATININE 1.5 (H) 01/07/2017 0758   CALCIUM 9.0 12/30/2017 0807   CALCIUM 9.3 01/07/2017 0758   GFRNONAA 32 (L) 12/30/2017 0807   GFRNONAA 30 (L) 12/16/2017 0806   GFRNONAA 55 (L) 02/18/2013 0827   GFRAA 37 (L) 12/30/2017 0807   GFRAA 35 (L) 12/16/2017 0806   GFRAA 63 02/18/2013 0827   Lab Results  Component Value Date   HGBA1C 5.8 (H) 09/03/2017   HGBA1C 6.1  (H) 03/11/2017   HGBA1C 8.2 (H) 11/05/2016   HGBA1C 8.1 (H) 08/09/2016   HGBA1C 6.5 (H) 01/03/2015   Lab Results  Component Value Date   INSULIN 15.3 03/11/2017   INSULIN 18.3 08/09/2016   CBC    Component Value Date/Time   WBC 3.4 (L) 12/30/2017 0807   RBC 3.48 (L) 12/30/2017 0807   HGB 10.5 (L) 12/30/2017 0807   HGB 10.6 (L) 12/16/2017 0806   HGB 10.8 (L) 01/07/2017 0758   HCT 32.1 (L) 12/30/2017 0807   HCT 31.9 (L) 01/07/2017 0758   PLT 83 (L) 12/30/2017 0807   PLT 83 (L) 12/16/2017 0806   PLT 126 (L) 01/07/2017 0758   MCV 92.2 12/30/2017 0807   MCV 90.8 01/07/2017 0758   MCH 30.2 12/30/2017 0807   MCHC 32.7 12/30/2017 0807   RDW 14.0 12/30/2017 0807   RDW 14.8 (H) 01/07/2017 0758   LYMPHSABS 0.8 12/30/2017 0807   LYMPHSABS 1.1 01/07/2017 0758   MONOABS 0.3 12/30/2017 0807   MONOABS 0.3 01/07/2017 0758   EOSABS 0.1 12/30/2017 0807   EOSABS 0.1 01/07/2017 0758   EOSABS 0.1 08/09/2016 1229   BASOSABS 0.0 12/30/2017 4098  BASOSABS 0.0 01/07/2017 0758   Iron/TIBC/Ferritin/ %Sat    Component Value Date/Time   IRON 58 02/18/2013 0827   TIBC 305 02/18/2013 0827   FERRITIN 133 10/31/2014 0816   IRONPCTSAT 19 (L) 02/18/2013 0827   Lipid Panel     Component Value Date/Time   CHOL 160 09/03/2017 1349   TRIG 111 09/03/2017 1349   HDL 59 09/03/2017 1349   CHOLHDL 2.9 02/18/2013 0827   VLDL 23 02/18/2013 0827   LDLCALC 79 09/03/2017 1349   Hepatic Function Panel     Component Value Date/Time   PROT 6.5 12/30/2017 0807   PROT 6.9 03/11/2017 0841   PROT 6.7 01/07/2017 0758   ALBUMIN 3.6 12/30/2017 0807   ALBUMIN 4.5 03/11/2017 0841   ALBUMIN 3.6 01/07/2017 0758   AST 101 (H) 12/30/2017 0807   AST 43 (H) 12/16/2017 0806   AST 51 (H) 01/07/2017 0758   ALT 121 (H) 12/30/2017 0807   ALT 57 (H) 12/16/2017 0806   ALT 82 (H) 01/07/2017 0758   ALKPHOS 179 (H) 12/30/2017 0807   ALKPHOS 115 01/07/2017 0758   BILITOT 0.6 12/30/2017 0807   BILITOT 0.6 12/16/2017  0806   BILITOT 0.72 01/07/2017 0758      Component Value Date/Time   TSH 1.54 02/07/2017   TSH 2.170 08/09/2016 1229   Results for Soper, Ercia L "[name]" (MRN [id number]) as of 01/05/2018 16:20  Ref. Range 09/03/2017 13:49  Vitamin D, 25-Hydroxy Latest Ref Range: 30.0 - 100.0 ng/mL 58.3    OBESITY BEHAVIORAL INTERVENTION VISIT  Today's visit was # 29  Starting weight: 225 lbs Starting date: 08/09/16 Today's weight : Weight: 194 lb (88 kg)  Today's date: 01/05/2018 Total lbs lost to date: 40  ASK: We discussed the diagnosis of obesity with [name] today and [name] agreed to give [name] permission to discuss obesity behavioral modification therapy today.  ASSESS: Lizmarie has the diagnosis of obesity and her BMI today is 28.6. Lamona is in the action stage of change.   ADVISE: Nilam was educated on the multiple health risks of obesity as well as the benefit of weight loss to improve her health. She was advised of the need for long term treatment and the importance of lifestyle modifications to improve her current health and to decrease her risk of future health problems.  AGREE: Multiple dietary modification options and treatment options were discussed and Marily agreed to follow the recommendations documented in the above note.  ARRANGE: Fiorella was educated on the importance of frequent visits to treat obesity as outlined per CMS and USPSTF guidelines and agreed to schedule her next follow up appointment today.  I, Marcille Blanco, am acting as transcriptionist for Starlyn Skeans, MD  I have reviewed the above documentation for accuracy and completeness, and I agree with the above. -Dennard Nip, MD

## 2018-01-06 ENCOUNTER — Inpatient Hospital Stay: Payer: Medicare Other

## 2018-01-06 ENCOUNTER — Encounter: Payer: Self-pay | Admitting: Adult Health

## 2018-01-06 ENCOUNTER — Telehealth: Payer: Self-pay | Admitting: Adult Health

## 2018-01-06 ENCOUNTER — Inpatient Hospital Stay: Payer: Medicare Other | Admitting: Adult Health

## 2018-01-06 VITALS — BP 124/60 | HR 62 | Temp 97.6°F | Resp 18 | Ht 69.0 in | Wt 199.0 lb

## 2018-01-06 DIAGNOSIS — Z171 Estrogen receptor negative status [ER-]: Secondary | ICD-10-CM

## 2018-01-06 DIAGNOSIS — C7989 Secondary malignant neoplasm of other specified sites: Secondary | ICD-10-CM | POA: Diagnosis not present

## 2018-01-06 DIAGNOSIS — C801 Malignant (primary) neoplasm, unspecified: Secondary | ICD-10-CM

## 2018-01-06 DIAGNOSIS — C50411 Malignant neoplasm of upper-outer quadrant of right female breast: Secondary | ICD-10-CM

## 2018-01-06 DIAGNOSIS — C787 Secondary malignant neoplasm of liver and intrahepatic bile duct: Secondary | ICD-10-CM

## 2018-01-06 DIAGNOSIS — C50911 Malignant neoplasm of unspecified site of right female breast: Secondary | ICD-10-CM

## 2018-01-06 DIAGNOSIS — Z95828 Presence of other vascular implants and grafts: Secondary | ICD-10-CM

## 2018-01-06 DIAGNOSIS — Z5112 Encounter for antineoplastic immunotherapy: Secondary | ICD-10-CM | POA: Diagnosis not present

## 2018-01-06 LAB — COMPREHENSIVE METABOLIC PANEL
ALT: 80 U/L — ABNORMAL HIGH (ref 0–44)
AST: 53 U/L — ABNORMAL HIGH (ref 15–41)
Albumin: 3.8 g/dL (ref 3.5–5.0)
Alkaline Phosphatase: 169 U/L — ABNORMAL HIGH (ref 38–126)
Anion gap: 7 (ref 5–15)
BUN: 28 mg/dL — ABNORMAL HIGH (ref 8–23)
CO2: 23 mmol/L (ref 22–32)
Calcium: 9.1 mg/dL (ref 8.9–10.3)
Chloride: 113 mmol/L — ABNORMAL HIGH (ref 98–111)
Creatinine, Ser: 1.41 mg/dL — ABNORMAL HIGH (ref 0.44–1.00)
GFR calc Af Amer: 45 mL/min — ABNORMAL LOW (ref >60)
GFR calc non Af Amer: 39 mL/min — ABNORMAL LOW (ref >60)
Glucose, Bld: 117 mg/dL — ABNORMAL HIGH (ref 70–99)
Potassium: 4.3 mmol/L (ref 3.5–5.1)
Sodium: 143 mmol/L (ref 135–145)
Total Bilirubin: 0.9 mg/dL (ref 0.3–1.2)
Total Protein: 6.7 g/dL (ref 6.5–8.1)

## 2018-01-06 LAB — CBC WITH DIFFERENTIAL/PLATELET
Abs Immature Granulocytes: 0.01 10*3/uL (ref 0.00–0.07)
Basophils Absolute: 0 10*3/uL (ref 0.0–0.1)
Basophils Relative: 0 %
Eosinophils Absolute: 0.1 10*3/uL (ref 0.0–0.5)
Eosinophils Relative: 5 %
HCT: 30.5 % — ABNORMAL LOW (ref 36.0–46.0)
Hemoglobin: 10.2 g/dL — ABNORMAL LOW (ref 12.0–15.0)
Immature Granulocytes: 0 %
Lymphocytes Relative: 35 %
Lymphs Abs: 1.1 10*3/uL (ref 0.7–4.0)
MCH: 30.6 pg (ref 26.0–34.0)
MCHC: 33.4 g/dL (ref 30.0–36.0)
MCV: 91.6 fL (ref 80.0–100.0)
Monocytes Absolute: 0.1 10*3/uL (ref 0.1–1.0)
Monocytes Relative: 4 %
Neutro Abs: 1.7 10*3/uL (ref 1.7–7.7)
Neutrophils Relative %: 56 %
Platelets: 106 10*3/uL — ABNORMAL LOW (ref 150–400)
RBC: 3.33 MIL/uL — ABNORMAL LOW (ref 3.87–5.11)
RDW: 13.5 % (ref 11.5–15.5)
WBC: 3 10*3/uL — ABNORMAL LOW (ref 4.0–10.5)
nRBC: 0 % (ref 0.0–0.2)

## 2018-01-06 MED ORDER — PACLITAXEL PROTEIN-BOUND CHEMO INJECTION 100 MG
100.0000 mg/m2 | Freq: Once | INTRAVENOUS | Status: AC
  Administered 2018-01-06: 200 mg via INTRAVENOUS
  Filled 2018-01-06: qty 40

## 2018-01-06 MED ORDER — SODIUM CHLORIDE 0.9% FLUSH
10.0000 mL | INTRAVENOUS | Status: DC | PRN
  Administered 2018-01-06: 10 mL
  Filled 2018-01-06: qty 10

## 2018-01-06 MED ORDER — PROCHLORPERAZINE MALEATE 10 MG PO TABS
10.0000 mg | ORAL_TABLET | Freq: Once | ORAL | Status: AC
  Administered 2018-01-06: 10 mg via ORAL

## 2018-01-06 MED ORDER — SODIUM CHLORIDE 0.9 % IV SOLN
Freq: Once | INTRAVENOUS | Status: AC
  Administered 2018-01-06: 14:00:00 via INTRAVENOUS
  Filled 2018-01-06: qty 250

## 2018-01-06 MED ORDER — HEPARIN SOD (PORK) LOCK FLUSH 100 UNIT/ML IV SOLN
500.0000 [IU] | Freq: Once | INTRAVENOUS | Status: AC | PRN
  Administered 2018-01-06: 500 [IU]
  Filled 2018-01-06: qty 5

## 2018-01-06 MED ORDER — PROCHLORPERAZINE MALEATE 10 MG PO TABS
ORAL_TABLET | ORAL | Status: AC
  Filled 2018-01-06: qty 1

## 2018-01-06 NOTE — Telephone Encounter (Signed)
Per 12/17 no los

## 2018-01-06 NOTE — Progress Notes (Signed)
[facility]  [phone] [phone]   ID: [name] DOB: [birth year]  MR#: [id number]  UNG#:[id number]  Patient Care Team: Vernie Shanks, MD as PCP - General (Family Medicine) Himmelrich, Bryson Ha, RD (Inactive) as Dietitian (Bariatrics) Stark Klein, MD as Consulting Physician (General Surgery) Magrinat, Virgie Dad, MD as Consulting Physician (Oncology) Arloa Koh, MD as Consulting Physician (Radiation Oncology) Mauro Kaufmann, RN as Registered Nurse Rockwell Germany, RN as Registered Nurse Jacelyn Pi, MD as Consulting Physician (Endocrinology) Kem Boroughs, [place] as Nurse Practitioner (Family Medicine) [name], MD as Consulting Physician (Plastic Surgery) Larey Dresser, MD as Consulting Physician (Cardiology) Gery Pray, MD as Consulting Physician (Radiation Oncology) Juanita Craver, MD as Consulting Physician (Gastroenterology) PCP: Vernie Shanks, MD OTHER MD:  CHIEF COMPLAINT:  HER-2 positive, estrogen receptor negative locally recurrent disease  CURRENT TREATMENT:  Trastuzumab; Abraxane  INTERVAL HISTORY:  Darlene [name] returns today for follow-up and treatment of her estrogen receptor negative but HER-2 amplified breast cancer accompanied by her husband. She started Abraxane given on days 1 and 8 of a 21 day cycle last week. Today is cycle 1 day 8.  She notes that she tolerated the first dose very well.  She denies peripheral neuropathy.     REVIEW OF SYSTEMS: Darlene [name] is doing well today.  She continues to unfortunately be psychologically consumed with her financial situation and not hearing back from our customer service.  This has caused a lot of worry and distress.  She asked if I could reach out to the last person she talked with, Larene Beach Nifong.    Darlene [name] is feeling well physically.  She denies unusual headaches, vision changes.  She is without nausea, vomiting, bowel or bladder changes.  She denies chest pain,  palpitations, cough, or shortness of breath.  A detailed ROS was otherwise non contributory.    BREAST CANCER HISTORY: From the original intake note:  "Darlene [name]" had screening mammography at her gynecologist suggestive of a possible abnormality in the right breast. However right diagnostic mammogram 04/12/2013 at [facility] was negative. More recently however, she noted a lump in her right breast and brought it to her gynecologist's attention. On 07/18/2014 the patient had bilateral diagnostic mammography with tomosynthesis at [facility]. The breast density was category B. In the right breast upper outer quadrant there was an area of focal asymmetry with indistinct margins.[name] Ultrasound was obtained and showed in addition to the mass in question, measuring 1.7 cm, an abnormal-appearing lymph node in the right axillary tail.  On 07/19/2014 the patient underwent biopsy of the right breast massin question as well as a right axillary lymph node. Both were positive for invasive ductal carcinoma, grade 2, estrogen and progesterone receptor negative, with an MIB-1 of 90%, and HER-2 pending. Incidentally the lymph node biopsy showed a lymphocytic inflammatory component but no lymph node architecture.  Her subsequent history is as detailed below.  PAST MEDICAL HISTORY: Past Medical History:  Diagnosis Date  . Anemia    hx of - during 1st round chemo  . Anxiety   . Arthritis    in fingers, shoulders  . Back pain   . Balance problem   . Breast cancer ([place])   . Breast cancer of upper-outer quadrant of right female breast ([place]) 07/22/2014  . Bunion, right foot   . Clumsiness   . Constipation   . Depression   . Diabetes mellitus without complication ([place])    76+ years  . Eating  disorder    binge eating  . Epistaxis 08/26/2014  . Fatty liver   . HCAP (healthcare-associated pneumonia) 12/18/2014  . Headache   . Heart murmur    never had any problems  . History of radiation therapy 04/05/15-05/19/15   right chest  wall was treated to 50.4 Gy in 28 fractions, right mastectomy scar was treated to 10 Gy in 5 fractions  . Hyperlipidemia   . Hypertension   . Joint pain   . Multiple food allergies   . Neuromuscular disorder ([place])    diabetic neuropathy  . Obesity   . Obstructive sleep apnea on CPAP    uses cpap nightly  . Ovarian cyst rupture    possible  . Pneumonia   . Pneumonia 11/2014  . Radiation 04/05/15-05/19/15   right chest wall 50.4 Gy, mastectomy scar 10 Gy  . Sleep apnea   . Thyroid nodule 06/2014    PAST SURGICAL HISTORY: Past Surgical History:  Procedure Laterality Date  . APPENDECTOMY    . BIOPSY THYROID Bilateral 06/2014   2 nodules - Benign   . BREAST LUMPECTOMY WITH RADIOACTIVE SEED AND SENTINEL LYMPH NODE BIOPSY Right 12/27/2014   Procedure: BREAST LUMPECTOMY WITH RADIOACTIVE SEED AND SENTINEL LYMPH NODE BIOPSY;  Surgeon: Stark Klein, MD;  Location: [place];  Service: General;  Laterality: Right;  . BREAST REDUCTION SURGERY Bilateral 01/06/2015   Procedure: BILATERAL BREAST REDUCTION, RIGHT ONCOPLASTIC RECONSTRUCTION),LEFT BREAST REDUCTION FOR SYMMETRY;  Surgeon: [name], MD;  Location: [facility];  Service: Plastics;  Laterality: Bilateral;  . BREATH TEK H PYLORI N/A 09/15/2012   Procedure: BREATH TEK H PYLORI;  Surgeon: Pedro Earls, MD;  Location: [facility];  Service: General;  Laterality: N/A;  . BUNIONECTOMY Left 12/2013  . CARPAL TUNNEL RELEASE Right   . CARPAL TUNNEL RELEASE Left   . CATARACT EXTRACTION     6/19  . COLONOSCOPY    . DUPUYTREN CONTRACTURE RELEASE    . GASTRIC ROUX-EN-Y N/A 11/02/2012   Procedure: LAPAROSCOPIC ROUX-EN-Y GASTRIC;  Surgeon: Pedro Earls, MD;  [facility];  Service: General;  Laterality: N/A;  . IR GENERIC HISTORICAL  03/18/2016   IR US GUIDE VASC ACCESS LEFT 03/18/2016 Markus Daft, MD [facility]  . IR GENERIC HISTORICAL  03/18/2016   IR FLUORO GUIDE CV LINE LEFT 03/18/2016 Markus Daft, MD [facility]  .  LAPAROSCOPIC APPENDECTOMY N/A 07/22/2015   Procedure: APPENDECTOMY LAPAROSCOPIC;  Surgeon: Michael Boston, MD;  [facility];  Service: General;  Laterality: N/A;  . MASTECTOMY Right 02/15/2015   modified  . MODIFIED MASTECTOMY Right 02/15/2015   Procedure: RIGHT MODIFIED RADICAL MASTECTOMY;  Surgeon: Stark Klein, MD;  Location: [facility];  Service: General;  Laterality: Right;  . PORT-A-CATH REMOVAL Left 10/10/2015   Procedure: PORT REMOVAL;  Surgeon: Stark Klein, MD;  Location: [place];  Service: General;  Laterality: Left;  . PORTACATH PLACEMENT Left 08/02/2014   Procedure: INSERTION PORT-A-CATH;  Surgeon: Stark Klein, MD;  Location: [facility];  Service: General;  Laterality: Left;  . PORTACATH PLACEMENT N/A 11/05/2016   Procedure: INSERTION PORT-A-CATH;  Surgeon: Stark Klein, MD;  Location: [facility];  Service: General;  Laterality: N/A;  . ROTATOR CUFF REPAIR Right 2005  . SCAR REVISION Right 12/08/2015   Procedure: COMPLEX REPAIR RIGHT CHEST 10-15CM;  Surgeon: [name], MD;  Location: [facility];  Service: Plastics;  Laterality: Right;  . TOE SURGERY  1970s   bone spur  .  TRIGGER FINGER RELEASE     x3    FAMILY HISTORY Family History  Problem Relation Age of Onset  . CVA Mother   . Heart disease Mother   . Sudden death Mother   . Diabetes Father   . Heart failure Father   . Hypertension Father   . Kidney disease Father   . Obesity Father   . Hypertension Sister   . Diabetes Brother   . Breast cancer Paternal Grandmother 37  . Diabetes Paternal Uncle   . Ovarian cancer Neg Hx    The patient's father died from complications of diabetes at age 33. The patient's mother died at age 72 with a subarachnoid hemorrhage. The patient had one brother, one sister. The only breast cancer was the patient's paternal grandmother diagnosed in her 32s. There is no history of ovarian cancer in the family.  GYNECOLOGIC HISTORY:  Patient's last menstrual period  was 10/22/2007.  menarche age 35, the patient is GX P0. She went through the change of life in 2011. She did not take hormone replacement.  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 Her husband [name] is a retired Visual merchandiser (he taught at [employer]).  [name] has 2 children from an earlier marriage, Rockey Situ who teaches in [place], and Nozomi Mettler,  who works at the [employer] in in [place]. He has 1 grand son, aged 34. The patient and her husband are not church attenders.   ADVANCED DIRECTIVES:  Not in place   HEALTH MAINTENANCE: Social History   Tobacco Use  . Smoking status: Never Smoker  . Smokeless tobacco: Never Used  Substance Use Topics  . Alcohol use: Yes    Alcohol/week: 1.0 standard drinks    Types: 1 Standard drinks or equivalent per week    Comment: social  . Drug use: No     Colonoscopy:  May 2016  PAP:  Bone density: 07/01/2016 at [facility] showed normal, with a T score of -0.7  Lipid panel:  Allergies  Allergen Reactions  . Nsaids Other (See Comments)    H/o gastric bypass - avoid NSAIDs  . Tape Hives, Rash and Other (See Comments)    Adhesives in bandaids    Current Outpatient Medications  Medication Sig Dispense Refill  . acetaminophen (TYLENOL) 325 MG tablet Take 650 mg by mouth 2 (two) times daily as needed for moderate pain or headache.     [name] buPROPion (WELLBUTRIN SR) 200 MG 12 hr tablet TAKE 1 TABLET BY MOUTH DAILY AT 12 NOON. 30 tablet 0  . CALCIUM CITRATE-VITAMIN D PO Take 1 tablet by mouth 3 (three) times daily.     . cholecalciferol (VITAMIN D) 1000 units tablet Take 1,000 Units by mouth daily.    . Cyanocobalamin (VITAMIN B-12 PO) Take 1 tablet by mouth every 14 (fourteen) days.    . diphenhydrAMINE (BENADRYL) 25 MG tablet Take 25 mg by mouth daily as needed for allergies or sleep.     . Insulin  Glargine (BASAGLAR KWIKPEN) 100 UNIT/ML SOPN INJECT 0.12 MLS (12 UNITS TOTAL) INTO THE SKIN DAILY. 5 pen 0  . Insulin Pen Needle (BD PEN NEEDLE NANO U/F) 32G X 4 MM MISC 1 Package by Does not apply route daily at 12 noon. 100 each 0  . liraglutide (VICTOZA) 18 MG/3ML SOPN INJECT 1.8MG(0.3 MLS) INTO THE SKIN EVERY MORNING 3  pen 0  . losartan (COZAAR) 100 MG tablet Take 1 tablet (100 mg total) by mouth daily. 30 tablet 0  . Melatonin 3 MG CAPS Take 3 mg by mouth at bedtime.     . metFORMIN (GLUCOPHAGE) 500 MG tablet Take 1 tablet (500 mg total) by mouth daily. 30 tablet 0  . ONETOUCH DELICA LANCETS 29V MISC 1 Package by Does not apply route daily. 100 each 0  . Pediatric Multivitamins-Iron (FLINTSTONES PLUS IRON) chewable tablet Chew 1 tablet by mouth 2 (two) times daily.    . Probiotic Product (CVS PROBIOTIC PO) Take 1 capsule by mouth daily.     . prochlorperazine (COMPAZINE) 10 MG tablet Take 1 tablet (10 mg total) by mouth every 6 (six) hours as needed for nausea or vomiting. 30 tablet 0  . Propylene Glycol (SYSTANE BALANCE OP) Place 1-2 drops into both eyes 3 (three) times daily as needed (for dry eyes).     [name] senna-docusate (SENNA PLUS) 8.6-50 MG tablet Take 1 tablet by mouth daily as needed for moderate constipation.     . topiramate (TOPAMAX) 25 MG tablet Take 1 tablet (25 mg total) by mouth daily. 30 tablet 0  . vortioxetine HBr (TRINTELLIX) 20 MG TABS Take 20 mg by mouth at bedtime. 30 tablet 0   No current facility-administered medications for this visit.     OBJECTIVE:  Vitals:   01/06/18 1339  BP: 124/60  Pulse: 62  Resp: 18  Temp: 97.6 F (36.4 C)  SpO2: 99%     Body mass index is 29.39 kg/m.    ECOG FS:1 - Symptomatic but completely ambulatory Filed Weights   01/06/18 1339  Weight: 199 lb (90.3 kg)  GENERAL: Patient is a well appearing female in no acute distress HEENT:  Sclerae anicteric.  Oropharynx clear and moist. No ulcerations or evidence of oropharyngeal  candidiasis. Neck is supple.  NODES:  No cervical, supraclavicular, or axillary lymphadenopathy palpated.  BREAST EXAM:  Declined LUNGS:  Clear to auscultation bilaterally.  No wheezes or rhonchi. HEART:  Regular rate and rhythm. No murmur appreciated. ABDOMEN:  Soft, nontender.  Positive, normoactive bowel sounds. No organomegaly palpated. MSK:  No focal spinal tenderness to palpation. Full range of motion bilaterally in the upper extremities. EXTREMITIES:  No peripheral edema.   SKIN:  Clear with no obvious rashes or skin changes. No nail dyscrasia. NEURO:  Nonfocal. Well oriented.  Appropriate affect.    LAB RESULTS:  CMP     Component Value Date/Time   NA 142 12/30/2017 0807   NA 143 03/11/2017 0841   NA 140 01/07/2017 0758   K 3.9 12/30/2017 0807   K 4.1 01/07/2017 0758   CL 112 (H) 12/30/2017 0807   CO2 21 (L) 12/30/2017 0807   CO2 24 01/07/2017 0758   GLUCOSE 182 (H) 12/30/2017 0807   GLUCOSE 134 01/07/2017 0758   BUN 31 (H) 12/30/2017 0807   BUN 40 (H) 03/11/2017 0841   BUN 33.6 (H) 01/07/2017 0758   CREATININE 1.66 (H) 12/30/2017 0807   CREATININE 1.74 (H) 12/16/2017 0806   CREATININE 1.5 (H) 01/07/2017 0758   CALCIUM 9.0 12/30/2017 0807   CALCIUM 9.3 01/07/2017 0758   PROT 6.5 12/30/2017 0807   PROT 6.9 03/11/2017 0841   PROT 6.7 01/07/2017 0758   ALBUMIN 3.6 12/30/2017 0807   ALBUMIN 4.5 03/11/2017 0841   ALBUMIN 3.6 01/07/2017 0758   AST 101 (H) 12/30/2017 0807   AST 43 (H) 12/16/2017 0806   AST  51 (H) 01/07/2017 0758   ALT 121 (H) 12/30/2017 0807   ALT 57 (H) 12/16/2017 0806   ALT 82 (H) 01/07/2017 0758   ALKPHOS 179 (H) 12/30/2017 0807   ALKPHOS 115 01/07/2017 0758   BILITOT 0.6 12/30/2017 0807   BILITOT 0.6 12/16/2017 0806   BILITOT 0.72 01/07/2017 0758   GFRNONAA 32 (L) 12/30/2017 0807   GFRNONAA 30 (L) 12/16/2017 0806   GFRNONAA 55 (L) 02/18/2013 0827   GFRAA 37 (L) 12/30/2017 0807   GFRAA 35 (L) 12/16/2017 0806   GFRAA 63 02/18/2013 0827     INo results found for: SPEP, UPEP  Lab Results  Component Value Date   WBC 3.0 (L) 01/06/2018   NEUTROABS 1.7 01/06/2018   HGB 10.2 (L) 01/06/2018   HCT 30.5 (L) 01/06/2018   MCV 91.6 01/06/2018   PLT 106 (L) 01/06/2018      Chemistry      Component Value Date/Time   NA 142 12/30/2017 0807   NA 143 03/11/2017 0841   NA 140 01/07/2017 0758   K 3.9 12/30/2017 0807   K 4.1 01/07/2017 0758   CL 112 (H) 12/30/2017 0807   CO2 21 (L) 12/30/2017 0807   CO2 24 01/07/2017 0758   BUN 31 (H) 12/30/2017 0807   BUN 40 (H) 03/11/2017 0841   BUN 33.6 (H) 01/07/2017 0758   CREATININE 1.66 (H) 12/30/2017 0807   CREATININE 1.74 (H) 12/16/2017 0806   CREATININE 1.5 (H) 01/07/2017 0758      Component Value Date/Time   CALCIUM 9.0 12/30/2017 0807   CALCIUM 9.3 01/07/2017 0758   ALKPHOS 179 (H) 12/30/2017 0807   ALKPHOS 115 01/07/2017 0758   AST 101 (H) 12/30/2017 0807   AST 43 (H) 12/16/2017 0806   AST 51 (H) 01/07/2017 0758   ALT 121 (H) 12/30/2017 0807   ALT 57 (H) 12/16/2017 0806   ALT 82 (H) 01/07/2017 0758   BILITOT 0.6 12/30/2017 0807   BILITOT 0.6 12/16/2017 0806   BILITOT 0.72 01/07/2017 0758       No results found for: LABCA2  No components found for: WLSLH734  No results for input(s): INR in the last 168 hours.  Urinalysis    Component Value Date/Time   COLORURINE YELLOW 07/21/2015 [place] 07/21/2015 2328   LABSPEC 1.020 07/21/2015 2328   PHURINE 6.0 07/21/2015 2328   GLUCOSEU NEGATIVE 07/21/2015 2328   HGBUR TRACE (A) 07/21/2015 [place] 07/21/2015 2328   BILIRUBINUR neg 06/29/2013 [place] 07/21/2015 2328   PROTEINUR 30 (A) 07/21/2015 2328   UROBILINOGEN negative 06/29/2013 1017   NITRITE NEGATIVE 07/21/2015 2328   LEUKOCYTESUR NEGATIVE 07/21/2015 2328    STUDIES: Nm Pet Image Restag (ps) Skull Base To Thigh  Result Date: 12/09/2017 CLINICAL DATA:  Subsequent treatment strategy for metastatic  adenocarcinoma of occult primary tumor. EXAM: NUCLEAR MEDICINE PET SKULL BASE TO THIGH TECHNIQUE: 9.64 mCi F-18 FDG was injected intravenously. Full-ring PET imaging was performed from the skull base to thigh after the radiotracer. CT data was obtained and used for attenuation correction and anatomic localization. Fasting blood glucose: 81 mg/dl COMPARISON:  07/10/2017 FINDINGS: Mediastinal blood pool activity: SUV max 2.76 NECK: No hypermetabolic lymph nodes in the neck. Incidental CT findings: none CHEST: No hypermetabolic supraclavicular lymph nodes. Within the right subpectoral region there is a 9 mm lymph node within SUV max of 5.3. New from previous exam. No hypermetabolic mediastinal or hilar lymph nodes.No hypermetabolic pulmonary  nodules or masses. Incidental CT findings: Aortic atherosclerosis. Calcification in the RCA, LAD and left circumflex coronary arteries noted. Post treatment changes from right mastectomy and right axillary nodal dissection again noted. ABDOMEN/PELVIS: 2.5 cm right lobe of liver hypermetabolic lesion is again noted. SUV max equals 20.3. Previously this measured 1.7 cm and had an SUV max of 9.88. No additional liver lesions identified. No abnormal uptake within the pancreas or spleen. Unremarkable appearance of the adrenal glands. No hypermetabolic lymph nodes identified within the abdomen. No hypermetabolic pelvic lymph nodes. Incidental CT findings: Postop changes from gastric bypass surgery noted. Gallstones. Aortic atherosclerosis. No aneurysm. Prominent upper abdominal lymph nodes are again noted without significant FDG uptake. For example peripancreatic lymph node measures 1.3 cm and has an SUV max of 2.77. Unchanged from previous exam. Portacaval node measures 1.4 cm and has an SUV max of 2.87. Unchanged from previous exam. Calcified uterine fibroids. SKELETON: No focal hypermetabolic activity to suggest skeletal metastasis. Incidental CT findings: Degenerative changes are  noted involving the left sternoclavicular joint in bilateral acromioclavicular joints with corresponding increased uptake indicating inflammation. IMPRESSION: 1. Right lobe of liver metastasis has increased in size and degree of FDG uptake compared with previous exam. 2. Recurrent hypermetabolic lymph node within the right subpectoral region. 3. Mild upper abdominal adenopathy without significant FDG uptake. Unchanged. 4.  Aortic Atherosclerosis (ICD10-I70.0). 5. Coronary artery atherosclerotic calcifications 6. Gallstones. Electronically Signed   By: Kerby Moors M.D.   On: 12/09/2017 16:41     ASSESSMENT: 67 y.o. [place] woman status post right breast upper outer quadrant and right axillary lymph node biopsy 07/19/2014 for a cT2  pN1, stage IIB  invasive ductal carcinoma, grade 2, estrogen and progesterone receptor negative, with an MIB-1 of 90%, and HER-2 amplified  (1) neoadjuvant chemotherapy started 08/08/2014, consisting of carboplatin, docetaxel, trastuzumab and pertuzumab given every 3 weeks 6, completed 11/21/2014  (a) breast MRI after initial 3 cycles shows a significant response  (b) breast MRI after 6 cycles shows a complete radiologic response   (2) trastuzumab continued through 10/02/2015 (to end of capecitabine therapy)  (a) echo 02/13/2015 shows an EF of 60-65%  (b)  echo 05/09/2015 shows an ejection fraction of 60-65%  (c) echocardiogram 06/27/2015 shows an ejection fraction of 55-60%.  (d) echocardiogram 01/22/2017 shows an ejection fraction of 55-60%  (3) right lumpectomy with sentinel lymph node biopsy on 12/27/14 showed a residual  ypT2 ypN1a, stage IIB invasive ductal carcinoma, grade 2, with repeat estrogen and progesterone receptors again negative  (4) status post right oncoplastic surgery (with left reduction mammoplasty) 01/06/2015 showing in the right breast multiple foci of intralymphatic carcinoma in random sections  (5)  Right modified radical mastectomy  02/15/2014 showed no residual invasive disease; there was DCIS but margins were negative; all 15 axillary lymph nodes were clear; ypTis ypN0  (a)  The patient has decided against reconstruction.  (6)  Postmastectomy radiation completed 05/18/2015 with capecitabine sensitization  (7) adjuvant capecitabine continued through 09/27/2015  (8) hepatic steatosis with increased fibrosis score (at risk for cirrhosis)  (9) thyroid biopsy (left lobe inferiorly) 2 shows benign tissue 06/28/2015  (10) LOCAL RECURRENCE:  (a) PET scan 12/06/2015 Notes a right retropectoral lymph node measuring 0.9 cm with an SUV max of 5.5  (b) repeat PET scan 03/11/2016 finds the right subpectoral lymph node to now measure 1.3 cm with an SUV max of 11.8.  (c) PET scan 05/07/2016 shows a significant response to T-DM 1  (d) PET scan  07/10/2017 is clear except for what appears to be a single new liver lesion   (11) started T-DM1 03/21/2016, completed 8 doses 08/13/2016  (a) PET scan 08/19/2016 shows a good response but measurable residual disease  (b) PET scan on 01/16/2017 shows a continuing response  (12) maintenance trastuzumab started 09/03/2016--changed to every 28 days as of 01/28/2017  (a) repeat echocardiogram 07/08/2016 shows an ejection fraction of 60-65%.  (b) echocardiogram on 10/08/2016 demonstrates LVEF of 55-60% (followed by Dr. Haroldine Laws)  (c) echo 01/22/2017 shows an ejection fraction in the 55-60% range  (d) echo 04/24/2017 shows an ejection fraction in the 55-60% range (recommendation for echo every 6 months by Dr. Aundra Dubin)  (e) trastuzumab changed to T-DM 1 after trastuzumab dose 07/15/2017  METASTATIC DISEASE: July 2019 (13)  Radiation to the area of persistent disease in the right upper chest completed 10/30/2016   (14) T-DM 1 resumed 08/12/2017, repeated every 21 days  (a) baseline liver MRI 08/02/2017 shows a 1.4 cm mass, "hot" on PET 07/10/2017  (b) repeat liver MRI 10/22/2018 3:19 cycles of  T-DM 1, shows slight enlargement  (c) T-DM 1 discontinued after 09/23/2017 dose  (15) trastuzumab resumed 11/18/2017, to be repeated every 4 weeks  (16) liver biopsy 11/03/2017 shows adenocarcinoma, most consistent with an upper gastrointestinal primary; this tumor was estrogen receptor and HER-2 negative  (a) GI workup for occult primary ([place]) negative  (b) PET 12/09/2017 shows no obvious primary.  There is a right subpectoral lymph node noted as well  (c) CA 27-29 and CA 19-9 uninformative; CEA pending   (17) to start Abraxane on 12/10, given days 1 and 8 on a 21 day cycle x 3, then restaging with MRI liver 02/24/2018 tentatively  (18) consider Yttrium or other local treatment to liver per IR  PLAN:  Darlene [name] is doing well today.  Her labs remain stable.  She will proceed with treatment today with Abraxane.  She is tolerating this well.    I reached out to [facility] via email in patient accounting about Darlene [name]'s isues. We spent a long time today talking about the financial stress of her diagnosis.     She will return in one week for her next Trastuzumab.  We will see her back in 2 weeks for labs, f/u and her next cycle of Abraxane.  She knows to call for any questions or concerns prior to her next appointment.    A total of (30) minutes of face-to-face time was spent with this patient with greater than 50% of that time in counseling and care-coordination.   Wilber Bihari, NP  01/06/18 1:41 PM Medical Oncology and Hematology [facility] [address] [place], [place] [address] Tel. [phone]    Fax. [phone]

## 2018-01-13 ENCOUNTER — Inpatient Hospital Stay: Payer: Medicare Other

## 2018-01-13 ENCOUNTER — Other Ambulatory Visit: Payer: Medicare Other

## 2018-01-13 ENCOUNTER — Other Ambulatory Visit: Payer: Self-pay | Admitting: Oncology

## 2018-01-13 VITALS — BP 137/61 | HR 71 | Temp 98.2°F | Resp 14

## 2018-01-13 DIAGNOSIS — Z95828 Presence of other vascular implants and grafts: Secondary | ICD-10-CM

## 2018-01-13 DIAGNOSIS — C50411 Malignant neoplasm of upper-outer quadrant of right female breast: Secondary | ICD-10-CM

## 2018-01-13 DIAGNOSIS — C801 Malignant (primary) neoplasm, unspecified: Secondary | ICD-10-CM

## 2018-01-13 DIAGNOSIS — Z5112 Encounter for antineoplastic immunotherapy: Secondary | ICD-10-CM | POA: Diagnosis not present

## 2018-01-13 DIAGNOSIS — Z171 Estrogen receptor negative status [ER-]: Secondary | ICD-10-CM

## 2018-01-13 DIAGNOSIS — C787 Secondary malignant neoplasm of liver and intrahepatic bile duct: Secondary | ICD-10-CM

## 2018-01-13 DIAGNOSIS — C50911 Malignant neoplasm of unspecified site of right female breast: Secondary | ICD-10-CM

## 2018-01-13 LAB — COMPREHENSIVE METABOLIC PANEL
ALT: 65 U/L — ABNORMAL HIGH (ref 0–44)
AST: 47 U/L — ABNORMAL HIGH (ref 15–41)
Albumin: 3.7 g/dL (ref 3.5–5.0)
Alkaline Phosphatase: 155 U/L — ABNORMAL HIGH (ref 38–126)
Anion gap: 6 (ref 5–15)
BUN: 33 mg/dL — ABNORMAL HIGH (ref 8–23)
CO2: 22 mmol/L (ref 22–32)
Calcium: 9.1 mg/dL (ref 8.9–10.3)
Chloride: 113 mmol/L — ABNORMAL HIGH (ref 98–111)
Creatinine, Ser: 1.77 mg/dL — ABNORMAL HIGH (ref 0.44–1.00)
GFR calc Af Amer: 34 mL/min — ABNORMAL LOW (ref >60)
GFR calc non Af Amer: 30 mL/min — ABNORMAL LOW (ref >60)
Glucose, Bld: 144 mg/dL — ABNORMAL HIGH (ref 70–99)
Potassium: 4.3 mmol/L (ref 3.5–5.1)
Sodium: 141 mmol/L (ref 135–145)
Total Bilirubin: 0.6 mg/dL (ref 0.3–1.2)
Total Protein: 6.5 g/dL (ref 6.5–8.1)

## 2018-01-13 LAB — CBC WITH DIFFERENTIAL/PLATELET
Abs Immature Granulocytes: 0.01 10*3/uL (ref 0.00–0.07)
Basophils Absolute: 0 10*3/uL (ref 0.0–0.1)
Basophils Relative: 1 %
Eosinophils Absolute: 0 10*3/uL (ref 0.0–0.5)
Eosinophils Relative: 2 %
HCT: 30 % — ABNORMAL LOW (ref 36.0–46.0)
Hemoglobin: 9.9 g/dL — ABNORMAL LOW (ref 12.0–15.0)
Immature Granulocytes: 1 %
Lymphocytes Relative: 48 %
Lymphs Abs: 0.8 10*3/uL (ref 0.7–4.0)
MCH: 30.6 pg (ref 26.0–34.0)
MCHC: 33 g/dL (ref 30.0–36.0)
MCV: 92.6 fL (ref 80.0–100.0)
Monocytes Absolute: 0.1 10*3/uL (ref 0.1–1.0)
Monocytes Relative: 7 %
Neutro Abs: 0.7 10*3/uL — ABNORMAL LOW (ref 1.7–7.7)
Neutrophils Relative %: 41 %
Platelets: 112 10*3/uL — ABNORMAL LOW (ref 150–400)
RBC: 3.24 MIL/uL — ABNORMAL LOW (ref 3.87–5.11)
RDW: 13.5 % (ref 11.5–15.5)
WBC: 1.7 10*3/uL — ABNORMAL LOW (ref 4.0–10.5)
nRBC: 0 % (ref 0.0–0.2)

## 2018-01-13 MED ORDER — DIPHENHYDRAMINE HCL 25 MG PO CAPS
ORAL_CAPSULE | ORAL | Status: AC
  Filled 2018-01-13: qty 1

## 2018-01-13 MED ORDER — FILGRASTIM-SNDZ 480 MCG/0.8ML IJ SOSY
480.0000 ug | PREFILLED_SYRINGE | Freq: Once | INTRAMUSCULAR | Status: AC
  Administered 2018-01-13: 480 ug via SUBCUTANEOUS
  Filled 2018-01-13: qty 0.8

## 2018-01-13 MED ORDER — FILGRASTIM 480 MCG/0.8ML IJ SOSY
480.0000 ug | PREFILLED_SYRINGE | Freq: Once | INTRAMUSCULAR | Status: DC
  Filled 2018-01-13: qty 0.8

## 2018-01-13 MED ORDER — ACETAMINOPHEN 325 MG PO TABS
ORAL_TABLET | ORAL | Status: AC
  Filled 2018-01-13: qty 2

## 2018-01-13 MED ORDER — SODIUM CHLORIDE 0.9% FLUSH
10.0000 mL | INTRAVENOUS | Status: DC | PRN
  Administered 2018-01-13: 10 mL
  Filled 2018-01-13: qty 10

## 2018-01-13 MED ORDER — HEPARIN SOD (PORK) LOCK FLUSH 100 UNIT/ML IV SOLN
500.0000 [IU] | Freq: Once | INTRAVENOUS | Status: AC | PRN
  Administered 2018-01-13: 500 [IU]
  Filled 2018-01-13: qty 5

## 2018-01-13 MED ORDER — DIPHENHYDRAMINE HCL 25 MG PO CAPS
25.0000 mg | ORAL_CAPSULE | Freq: Once | ORAL | Status: AC
  Administered 2018-01-13: 25 mg via ORAL

## 2018-01-13 MED ORDER — SODIUM CHLORIDE 0.9 % IJ SOLN
10.0000 mL | INTRAMUSCULAR | Status: DC | PRN
  Filled 2018-01-13: qty 10

## 2018-01-13 MED ORDER — ACETAMINOPHEN 325 MG PO TABS
650.0000 mg | ORAL_TABLET | Freq: Once | ORAL | Status: AC
  Administered 2018-01-13: 650 mg via ORAL

## 2018-01-13 MED ORDER — TRASTUZUMAB-ANNS CHEMO 420 MG IV SOLR
6.0000 mg/kg | Freq: Once | INTRAVENOUS | Status: AC
  Administered 2018-01-13: 546 mg via INTRAVENOUS
  Filled 2018-01-13: qty 7.14

## 2018-01-13 MED ORDER — SODIUM CHLORIDE 0.9 % IV SOLN
Freq: Once | INTRAVENOUS | Status: AC
  Administered 2018-01-13: 09:00:00 via INTRAVENOUS
  Filled 2018-01-13: qty 250

## 2018-01-13 NOTE — Progress Notes (Signed)
Darlene [name]'s ANC today was down to 0.7.  We are pushing on with treatment however and she will see [name] again 01/20/2018.  We did discuss neutropenic precautions with her and if she develops a fever she will immediately call.  Also I am adding Neupogen x3 before next week

## 2018-01-13 NOTE — Patient Instructions (Addendum)
[facility] Discharge Instructions for Patients Receiving Chemotherapy  Today you received the following chemotherapy agent, transtuzumab-aans (kanjunti)  To help prevent nausea and vomiting after your treatment, we encourage you to take your nausea medication.    If you develop nausea and vomiting that is not controlled by your nausea medication, call the clinic.   BELOW ARE SYMPTOMS THAT SHOULD BE REPORTED IMMEDIATELY:  *FEVER GREATER THAN 100.5 F  *CHILLS WITH OR WITHOUT FEVER  NAUSEA AND VOMITING THAT IS NOT CONTROLLED WITH YOUR NAUSEA MEDICATION  *UNUSUAL SHORTNESS OF BREATH  *UNUSUAL BRUISING OR BLEEDING  TENDERNESS IN MOUTH AND THROAT WITH OR WITHOUT PRESENCE OF ULCERS  *URINARY PROBLEMS  *BOWEL PROBLEMS  UNUSUAL RASH Items with * indicate a potential emergency and should be followed up as soon as possible.  Feel free to call the clinic should you have any questions or concerns. The clinic phone number is [phone].  Please show the [place] at check-in to the Emergency Department and triage nurse.  [facility] Discharge Instructions for Patients Receiving Chemotherapy  Today you received the following chemotherapy agents   To help prevent nausea and vomiting after your treatment, we encourage you to take your nausea medication    If you develop nausea and vomiting that is not controlled by your nausea medication, call the clinic.   BELOW ARE SYMPTOMS THAT SHOULD BE REPORTED IMMEDIATELY:  *FEVER GREATER THAN 100.5 F  *CHILLS WITH OR WITHOUT FEVER  NAUSEA AND VOMITING THAT IS NOT CONTROLLED WITH YOUR NAUSEA MEDICATION  *UNUSUAL SHORTNESS OF BREATH  *UNUSUAL BRUISING OR BLEEDING  TENDERNESS IN MOUTH AND THROAT WITH OR WITHOUT PRESENCE OF ULCERS  *URINARY PROBLEMS  *BOWEL PROBLEMS  UNUSUAL RASH Items with * indicate a potential emergency and should be followed up as soon as possible.  Feel free to call the  clinic should you have any questions or concerns. The clinic phone number is [phone].  Please show the [place] at check-in to the Emergency Department and triage nurse.

## 2018-01-13 NOTE — Patient Instructions (Signed)

## 2018-01-15 ENCOUNTER — Inpatient Hospital Stay: Payer: Medicare Other

## 2018-01-15 DIAGNOSIS — C50411 Malignant neoplasm of upper-outer quadrant of right female breast: Secondary | ICD-10-CM

## 2018-01-15 DIAGNOSIS — Z171 Estrogen receptor negative status [ER-]: Secondary | ICD-10-CM

## 2018-01-15 DIAGNOSIS — Z95828 Presence of other vascular implants and grafts: Secondary | ICD-10-CM

## 2018-01-15 DIAGNOSIS — Z5112 Encounter for antineoplastic immunotherapy: Secondary | ICD-10-CM | POA: Diagnosis not present

## 2018-01-15 MED ORDER — FILGRASTIM-SNDZ 480 MCG/0.8ML IJ SOSY
480.0000 ug | PREFILLED_SYRINGE | Freq: Once | INTRAMUSCULAR | Status: AC
  Administered 2018-01-15: 480 ug via SUBCUTANEOUS
  Filled 2018-01-15: qty 0.8

## 2018-01-15 NOTE — Patient Instructions (Signed)
Filgrastim, G-CSF injection What is this medicine? FILGRASTIM, G-CSF (fil GRA stim) is a granulocyte colony-stimulating factor that stimulates the growth of neutrophils, a type of white blood cell (WBC) important in the body's fight against infection. It is used to reduce the incidence of fever and infection in patients with certain types of cancer who are receiving chemotherapy that affects the bone marrow, to stimulate blood cell production for removal of WBCs from the body prior to a bone marrow transplantation, to reduce the incidence of fever and infection in patients who have severe chronic neutropenia, and to improve survival outcomes following high-dose radiation exposure that is toxic to the bone marrow. This medicine may be used for other purposes; ask your health care provider or pharmacist if you have questions. COMMON BRAND NAME(S): Neupogen, Nivestym, Zarxio What should I tell my health care provider before I take this medicine? They need to know if you have any of these conditions: -kidney disease -latex allergy -ongoing radiation therapy -sickle cell disease -an unusual or allergic reaction to filgrastim, pegfilgrastim, other medicines, foods, dyes, or preservatives -pregnant or trying to get pregnant -breast-feeding How should I use this medicine? This medicine is for injection under the skin or infusion into a vein. As an infusion into a vein, it is usually given by a health care professional in a hospital or clinic setting. If you get this medicine at home, you will be taught how to prepare and give this medicine. Refer to the Instructions for Use that come with your medication packaging. Use exactly as directed. Take your medicine at regular intervals. Do not take your medicine more often than directed. It is important that you put your used needles and syringes in a special sharps container. Do not put them in a trash can. If you do not have a sharps container, call your  pharmacist or healthcare provider to get one. Talk to your pediatrician regarding the use of this medicine in children. While this drug may be prescribed for children as young as 7 months for selected conditions, precautions do apply. Overdosage: If you think you have taken too much of this medicine contact a poison control center or emergency room at once. NOTE: This medicine is only for you. Do not share this medicine with others. What if I miss a dose? It is important not to miss your dose. Call your doctor or health care professional if you miss a dose. What may interact with this medicine? This medicine may interact with the following medications: -medicines that may cause a release of neutrophils, such as lithium This list may not describe all possible interactions. Give your health care provider a list of all the medicines, herbs, non-prescription drugs, or dietary supplements you use. Also tell them if you smoke, drink alcohol, or use illegal drugs. Some items may interact with your medicine. What should I watch for while using this medicine? You may need blood work done while you are taking this medicine. What side effects may I notice from receiving this medicine? Side effects that you should report to your doctor or health care professional as soon as possible: -allergic reactions like skin rash, itching or hives, swelling of the face, lips, or tongue -back pain -dizziness or feeling faint -fever -pain, redness, or irritation at site where injected -pinpoint red spots on the skin -shortness of breath or breathing problems -signs and symptoms of kidney injury like trouble passing urine, change in the amount of urine, or red or dark-brown urine -stomach   or side pain, or pain at the shoulder -swelling -tiredness -unusual bleeding or bruising Side effects that usually do not require medical attention (report to your doctor or health care professional if they continue or are  bothersome): -bone pain -cough -diarrhea -hair loss -headache -muscle pain This list may not describe all possible side effects. Call your doctor for medical advice about side effects. You may report side effects to FDA at 1-800-FDA-1088. Where should I keep my medicine? Keep out of the reach of children. Store in a refrigerator between 2 and 8 degrees C (36 and 46 degrees F). Do not freeze. Keep in carton to protect from light. Throw away this medicine if vials or syringes are left out of the refrigerator for more than 24 hours. Throw away any unused medicine after the expiration date. NOTE: This sheet is a summary. It may not cover all possible information. If you have questions about this medicine, talk to your doctor, pharmacist, or health care provider.  2019 [employer] (2017-08-22 15:39:45)  

## 2018-01-16 ENCOUNTER — Inpatient Hospital Stay: Payer: Medicare Other

## 2018-01-16 VITALS — BP 140/56 | HR 81 | Temp 98.5°F | Resp 18

## 2018-01-16 DIAGNOSIS — Z171 Estrogen receptor negative status [ER-]: Secondary | ICD-10-CM

## 2018-01-16 DIAGNOSIS — Z5112 Encounter for antineoplastic immunotherapy: Secondary | ICD-10-CM | POA: Diagnosis not present

## 2018-01-16 DIAGNOSIS — Z95828 Presence of other vascular implants and grafts: Secondary | ICD-10-CM

## 2018-01-16 DIAGNOSIS — C50411 Malignant neoplasm of upper-outer quadrant of right female breast: Secondary | ICD-10-CM

## 2018-01-16 MED ORDER — FILGRASTIM-SNDZ 480 MCG/0.8ML IJ SOSY
480.0000 ug | PREFILLED_SYRINGE | Freq: Once | INTRAMUSCULAR | Status: AC
  Administered 2018-01-16: 480 ug via SUBCUTANEOUS
  Filled 2018-01-16: qty 0.8

## 2018-01-20 ENCOUNTER — Inpatient Hospital Stay: Payer: Medicare Other

## 2018-01-20 ENCOUNTER — Inpatient Hospital Stay: Payer: Medicare Other | Admitting: Adult Health

## 2018-01-20 ENCOUNTER — Encounter: Payer: Self-pay | Admitting: Oncology

## 2018-01-20 ENCOUNTER — Encounter: Payer: Self-pay | Admitting: Adult Health

## 2018-01-20 VITALS — BP 127/59 | HR 64 | Temp 97.9°F | Resp 18 | Ht 69.0 in | Wt 200.8 lb

## 2018-01-20 DIAGNOSIS — C50411 Malignant neoplasm of upper-outer quadrant of right female breast: Secondary | ICD-10-CM | POA: Diagnosis not present

## 2018-01-20 DIAGNOSIS — Z5112 Encounter for antineoplastic immunotherapy: Secondary | ICD-10-CM | POA: Diagnosis not present

## 2018-01-20 DIAGNOSIS — C787 Secondary malignant neoplasm of liver and intrahepatic bile duct: Secondary | ICD-10-CM

## 2018-01-20 DIAGNOSIS — C7989 Secondary malignant neoplasm of other specified sites: Secondary | ICD-10-CM | POA: Diagnosis not present

## 2018-01-20 DIAGNOSIS — Z171 Estrogen receptor negative status [ER-]: Secondary | ICD-10-CM | POA: Diagnosis not present

## 2018-01-20 DIAGNOSIS — Z95828 Presence of other vascular implants and grafts: Secondary | ICD-10-CM

## 2018-01-20 DIAGNOSIS — C50911 Malignant neoplasm of unspecified site of right female breast: Secondary | ICD-10-CM

## 2018-01-20 DIAGNOSIS — C801 Malignant (primary) neoplasm, unspecified: Secondary | ICD-10-CM

## 2018-01-20 LAB — CBC WITH DIFFERENTIAL/PLATELET
Abs Immature Granulocytes: 0.53 10*3/uL — ABNORMAL HIGH (ref 0.00–0.07)
Basophils Absolute: 0 10*3/uL (ref 0.0–0.1)
Basophils Relative: 1 %
Eosinophils Absolute: 0.1 10*3/uL (ref 0.0–0.5)
Eosinophils Relative: 2 %
HCT: 28.4 % — ABNORMAL LOW (ref 36.0–46.0)
Hemoglobin: 9.4 g/dL — ABNORMAL LOW (ref 12.0–15.0)
Immature Granulocytes: 8 %
Lymphocytes Relative: 17 %
Lymphs Abs: 1.1 10*3/uL (ref 0.7–4.0)
MCH: 30.9 pg (ref 26.0–34.0)
MCHC: 33.1 g/dL (ref 30.0–36.0)
MCV: 93.4 fL (ref 80.0–100.0)
Monocytes Absolute: 0.7 10*3/uL (ref 0.1–1.0)
Monocytes Relative: 11 %
Neutro Abs: 4 10*3/uL (ref 1.7–7.7)
Neutrophils Relative %: 61 %
Platelets: 77 10*3/uL — ABNORMAL LOW (ref 150–400)
RBC: 3.04 MIL/uL — ABNORMAL LOW (ref 3.87–5.11)
RDW: 14.9 % (ref 11.5–15.5)
WBC: 6.5 10*3/uL (ref 4.0–10.5)
nRBC: 0 % (ref 0.0–0.2)

## 2018-01-20 LAB — COMPREHENSIVE METABOLIC PANEL
ALT: 63 U/L — ABNORMAL HIGH (ref 0–44)
AST: 43 U/L — ABNORMAL HIGH (ref 15–41)
Albumin: 3.5 g/dL (ref 3.5–5.0)
Alkaline Phosphatase: 193 U/L — ABNORMAL HIGH (ref 38–126)
Anion gap: 8 (ref 5–15)
BUN: 24 mg/dL — ABNORMAL HIGH (ref 8–23)
CO2: 22 mmol/L (ref 22–32)
Calcium: 9 mg/dL (ref 8.9–10.3)
Chloride: 114 mmol/L — ABNORMAL HIGH (ref 98–111)
Creatinine, Ser: 1.55 mg/dL — ABNORMAL HIGH (ref 0.44–1.00)
GFR calc Af Amer: 40 mL/min — ABNORMAL LOW (ref >60)
GFR calc non Af Amer: 35 mL/min — ABNORMAL LOW (ref >60)
Glucose, Bld: 118 mg/dL — ABNORMAL HIGH (ref 70–99)
Potassium: 4 mmol/L (ref 3.5–5.1)
Sodium: 144 mmol/L (ref 135–145)
Total Bilirubin: 0.5 mg/dL (ref 0.3–1.2)
Total Protein: 6.3 g/dL — ABNORMAL LOW (ref 6.5–8.1)

## 2018-01-20 MED ORDER — SODIUM CHLORIDE 0.9 % IJ SOLN
10.0000 mL | INTRAMUSCULAR | Status: DC | PRN
  Filled 2018-01-20: qty 10

## 2018-01-20 MED ORDER — PACLITAXEL PROTEIN-BOUND CHEMO INJECTION 100 MG
80.0000 mg/m2 | Freq: Once | INTRAVENOUS | Status: AC
  Administered 2018-01-20: 175 mg via INTRAVENOUS
  Filled 2018-01-20: qty 35

## 2018-01-20 MED ORDER — SODIUM CHLORIDE 0.9% FLUSH
10.0000 mL | INTRAVENOUS | Status: DC | PRN
  Administered 2018-01-20: 10 mL
  Filled 2018-01-20: qty 10

## 2018-01-20 MED ORDER — HEPARIN SOD (PORK) LOCK FLUSH 100 UNIT/ML IV SOLN
500.0000 [IU] | Freq: Once | INTRAVENOUS | Status: AC | PRN
  Administered 2018-01-20: 500 [IU]
  Filled 2018-01-20: qty 5

## 2018-01-20 MED ORDER — SODIUM CHLORIDE 0.9 % IV SOLN
Freq: Once | INTRAVENOUS | Status: AC
  Administered 2018-01-20: 14:00:00 via INTRAVENOUS
  Filled 2018-01-20: qty 250

## 2018-01-20 MED ORDER — SODIUM CHLORIDE 0.9% FLUSH
10.0000 mL | INTRAVENOUS | Status: DC | PRN
  Administered 2018-01-20: 10 mL via INTRAVENOUS
  Filled 2018-01-20: qty 10

## 2018-01-20 MED ORDER — PROCHLORPERAZINE MALEATE 10 MG PO TABS
ORAL_TABLET | ORAL | Status: AC
  Filled 2018-01-20: qty 1

## 2018-01-20 MED ORDER — PROCHLORPERAZINE MALEATE 10 MG PO TABS
10.0000 mg | ORAL_TABLET | Freq: Once | ORAL | Status: AC
  Administered 2018-01-20: 10 mg via ORAL

## 2018-01-20 NOTE — Progress Notes (Signed)
[place]  Telephone:([phone] Fax:([phone]) [phone]   ID: SWAN FAIRFAX DOB: 1950-10-09  MR#: [id number]  FMB#:[id number]  Patient Care Team: Vernie Shanks, MD as PCP - General (Family Medicine) Himmelrich, Bryson Ha, RD (Inactive) as Dietitian (Bariatrics) Stark Klein, MD as Consulting Physician (General Surgery) Magrinat, Virgie Dad, MD as Consulting Physician (Oncology) Arloa Koh, MD as Consulting Physician (Radiation Oncology) Mauro Kaufmann, RN as Registered Nurse Rockwell Germany, RN as Registered Nurse Jacelyn Pi, MD as Consulting Physician (Endocrinology) Kem Boroughs, Douglas as Nurse Practitioner (Family Medicine) [name], MD as Consulting Physician (Plastic Surgery) Larey Dresser, MD as Consulting Physician (Cardiology) Gery Pray, MD as Consulting Physician (Radiation Oncology) Juanita Craver, MD as Consulting Physician (Gastroenterology) PCP: Vernie Shanks, MD OTHER MD:  CHIEF COMPLAINT:  HER-2 positive, estrogen receptor negative locally recurrent disease  CURRENT TREATMENT:  Trastuzumab; Abraxane  INTERVAL HISTORY:  Darlene [name] returns today for follow-up and treatment of her estrogen receptor negative but HER-2 amplified breast cancer accompanied by her husband. She started Abraxane given on days 1 and 8 of a 21 day cycle last week. Today is cycle 2 day 1.  She also receives Trastuzumab biosimilar [place] every 21 days next due on 02/03/2018.  [name] underwent the [place] last week and at that time was noted to be mildly neutropenic.  She received three granix injections, and now her WBC is improved.  Her plts however are lower today at 48.    REVIEW OF SYSTEMS: Darlene [name] is doing moderately well.  She notes that she has taken compazine twice.  She denies peripheral neuropathy.  She is losing her hair.  She feels more tired today.  Otherwise, she is without fevers, chills, cough, shortness of breath, palpitations, bowel/bladder  changes, decreased appetite, weight loss, dysphagia, new pain, or any other concerns.  A detailed ROS was otherwise non contributory.    BREAST CANCER HISTORY: From the original intake note:  "Darlene [name]" had screening mammography at her gynecologist suggestive of a possible abnormality in the right breast. However right diagnostic mammogram 04/12/2013 at [facility] was negative. More recently however, she noted a lump in her right breast and brought it to her gynecologist's attention. On 07/18/2014 the patient had bilateral diagnostic mammography with tomosynthesis at [facility] The breast density was category B. In the right breast upper outer quadrant there was an area of focal asymmetry with indistinct margins.[name] Ultrasound was obtained and showed in addition to the mass in question, measuring 1.7 cm, an abnormal-appearing lymph node in the right axillary tail.  On 07/19/2014 the patient underwent biopsy of the right breast massin question as well as a right axillary lymph node. Both were positive for invasive ductal carcinoma, grade 2, estrogen and progesterone receptor negative, with an MIB-1 of 90%, and HER-2 pending. Incidentally the lymph node biopsy showed a lymphocytic inflammatory component but no lymph node architecture.  Her subsequent history is as detailed below.  PAST MEDICAL HISTORY: Past Medical History:  Diagnosis Date  . Anemia    hx of - during 1st round chemo  . Anxiety   . Arthritis    in fingers, shoulders  . Back pain   . Balance problem   . Breast cancer ([place])   . Breast cancer of upper-outer quadrant of right female breast ([place]) 07/22/2014  . Bunion, right foot   . Clumsiness   . Constipation   . Depression   . Diabetes mellitus without complication ([place])    70+ years  .  Eating disorder    binge eating  . Epistaxis 08/26/2014  . Fatty liver   . HCAP (healthcare-associated pneumonia) 12/18/2014  . Headache   . Heart murmur    never had any problems  . History of radiation  therapy 04/05/15-05/19/15   right chest wall was treated to 50.4 Gy in 28 fractions, right mastectomy scar was treated to 10 Gy in 5 fractions  . Hyperlipidemia   . Hypertension   . Joint pain   . Multiple food allergies   . Neuromuscular disorder ([place])    diabetic neuropathy  . Obesity   . Obstructive sleep apnea on CPAP    uses cpap nightly  . Ovarian cyst rupture    possible  . Pneumonia   . Pneumonia 11/2014  . Radiation 04/05/15-05/19/15   right chest wall 50.4 Gy, mastectomy scar 10 Gy  . Sleep apnea   . Thyroid nodule 06/2014    PAST SURGICAL HISTORY: Past Surgical History:  Procedure Laterality Date  . APPENDECTOMY    . BIOPSY THYROID Bilateral 06/2014   2 nodules - Benign   . BREAST LUMPECTOMY WITH RADIOACTIVE SEED AND SENTINEL LYMPH NODE BIOPSY Right 12/27/2014   Procedure: BREAST LUMPECTOMY WITH RADIOACTIVE SEED AND SENTINEL LYMPH NODE BIOPSY;  Surgeon: Stark Klein, MD;  Location: [place];  Service: General;  Laterality: Right;  . BREAST REDUCTION SURGERY Bilateral 01/06/2015   Procedure: BILATERAL BREAST REDUCTION, RIGHT ONCOPLASTIC RECONSTRUCTION),LEFT BREAST REDUCTION FOR SYMMETRY;  Surgeon: [name], MD;  Location: [place];  Service: Plastics;  Laterality: Bilateral;  . BREATH TEK H PYLORI N/A 09/15/2012   Procedure: BREATH TEK H PYLORI;  Surgeon: Pedro Earls, MD;  Location: [facility];  Service: General;  Laterality: N/A;  . BUNIONECTOMY Left 12/2013  . CARPAL TUNNEL RELEASE Right   . CARPAL TUNNEL RELEASE Left   . CATARACT EXTRACTION     6/19  . COLONOSCOPY    . DUPUYTREN CONTRACTURE RELEASE    . GASTRIC ROUX-EN-Y N/A 11/02/2012   Procedure: LAPAROSCOPIC ROUX-EN-Y GASTRIC;  Surgeon: Pedro Earls, MD;  Location: [facility];  Service: General;  Laterality: N/A;  . IR GENERIC HISTORICAL  03/18/2016   IR US GUIDE VASC ACCESS LEFT 03/18/2016 Markus Daft, MD [facility]  . IR GENERIC HISTORICAL  03/18/2016   IR FLUORO GUIDE CV LINE LEFT  03/18/2016 Markus Daft, MD [facility]  . LAPAROSCOPIC APPENDECTOMY N/A 07/22/2015   Procedure: APPENDECTOMY LAPAROSCOPIC;  Surgeon: Michael Boston, MD;  Location: [facility];  Service: General;  Laterality: N/A;  . MASTECTOMY Right 02/15/2015   modified  . MODIFIED MASTECTOMY Right 02/15/2015   Procedure: RIGHT MODIFIED RADICAL MASTECTOMY;  Surgeon: Stark Klein, MD;  Location: [place];  Service: General;  Laterality: Right;  . PORT-A-CATH REMOVAL Left 10/10/2015   Procedure: PORT REMOVAL;  Surgeon: Stark Klein, MD;  Location: [place];  Service: General;  Laterality: Left;  . PORTACATH PLACEMENT Left 08/02/2014   Procedure: INSERTION PORT-A-CATH;  Surgeon: Stark Klein, MD;  Location: [place];  Service: General;  Laterality: Left;  . PORTACATH PLACEMENT N/A 11/05/2016   Procedure: INSERTION PORT-A-CATH;  Surgeon: Stark Klein, MD;  Location: [place];  Service: General;  Laterality: N/A;  . ROTATOR CUFF REPAIR Right 2005  . SCAR REVISION Right 12/08/2015   Procedure: COMPLEX REPAIR RIGHT CHEST 10-15CM;  Surgeon: [name], MD;  Location: [place];  Service: Plastics;  Laterality: Right;  . TOE SURGERY  1970s   bone spur  .  TRIGGER FINGER RELEASE     x3    FAMILY HISTORY Family History  Problem Relation Age of Onset  . CVA Mother   . Heart disease Mother   . Sudden death Mother   . Diabetes Father   . Heart failure Father   . Hypertension Father   . Kidney disease Father   . Obesity Father   . Hypertension Sister   . Diabetes Brother   . Breast cancer Paternal Grandmother 17  . Diabetes Paternal Uncle   . Ovarian cancer Neg Hx    The patient's father died from complications of diabetes at age 54. The patient's mother died at age 31 with a subarachnoid hemorrhage. The patient had one brother, one sister. The only breast cancer was the patient's paternal grandmother diagnosed in her 47s. There is no history of ovarian cancer in the family.  GYNECOLOGIC  HISTORY:  Patient's last menstrual period was 10/22/2007.  menarche age 37, the patient is GX P0. She went through the change of life in 2011. She did not take hormone replacement.  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 Her husband [name] is a retired Visual merchandiser (he taught at [employer]).  [name] has 2 children from an earlier marriage, Rockey Situ who teaches in [place], and Latish Toutant,  who works at the [employer] in in [place]. He has 1 grand son, aged 21. The patient and her husband are not church attenders.   ADVANCED DIRECTIVES:  Not in place   HEALTH MAINTENANCE: Social History   Tobacco Use  . Smoking status: Never Smoker  . Smokeless tobacco: Never Used  Substance Use Topics  . Alcohol use: Yes    Alcohol/week: 1.0 standard drinks    Types: 1 Standard drinks or equivalent per week    Comment: social  . Drug use: No     Colonoscopy:  May 2016  PAP:  Bone density: 07/01/2016 at [facility] showed normal, with a T score of -0.7  Lipid panel:  Allergies  Allergen Reactions  . Nsaids Other (See Comments)    H/o gastric bypass - avoid NSAIDs  . Tape Hives, Rash and Other (See Comments)    Adhesives in bandaids    Current Outpatient Medications  Medication Sig Dispense Refill  . acetaminophen (TYLENOL) 325 MG tablet Take 650 mg by mouth 2 (two) times daily as needed for moderate pain or headache.     [name] buPROPion (WELLBUTRIN SR) 200 MG 12 hr tablet TAKE 1 TABLET BY MOUTH DAILY AT 12 NOON. 30 tablet 0  . CALCIUM CITRATE-VITAMIN D PO Take 1 tablet by mouth 3 (three) times daily.     . cholecalciferol (VITAMIN D) 1000 units tablet Take 1,000 Units by mouth daily.    . Cyanocobalamin (VITAMIN B-12 PO) Take 1 tablet by mouth every 14 (fourteen) days.    . diphenhydrAMINE (BENADRYL) 25 MG tablet Take 25 mg by mouth daily as needed  for allergies or sleep.     . Insulin Glargine (BASAGLAR KWIKPEN) 100 UNIT/ML SOPN INJECT 0.12 MLS (12 UNITS TOTAL) INTO THE SKIN DAILY. 5 pen 0  . Insulin Pen Needle (BD PEN NEEDLE NANO U/F) 32G X 4 MM MISC 1 Package by Does not apply route daily at 12 noon. 100 each 0  . liraglutide (VICTOZA) 18 MG/3ML SOPN INJECT 1.8MG(0.3 MLS) INTO THE SKIN EVERY MORNING 3  pen 0  . loratadine (CLARITIN) 10 MG tablet Take 10 mg by mouth as needed for allergies.    [name] losartan (COZAAR) 100 MG tablet Take 1 tablet (100 mg total) by mouth daily. 30 tablet 0  . Melatonin 3 MG CAPS Take 3 mg by mouth at bedtime.     . metFORMIN (GLUCOPHAGE) 500 MG tablet Take 1 tablet (500 mg total) by mouth daily. 30 tablet 0  . ONETOUCH DELICA LANCETS 11H MISC 1 Package by Does not apply route daily. 100 each 0  . Pediatric Multivitamins-Iron (FLINTSTONES PLUS IRON) chewable tablet Chew 1 tablet by mouth 2 (two) times daily.    . Probiotic Product ([employer]S PROBIOTIC PO) Take 1 capsule by mouth daily.     . prochlorperazine (COMPAZINE) 10 MG tablet Take 1 tablet (10 mg total) by mouth every 6 (six) hours as needed for nausea or vomiting. 30 tablet 0  . Propylene Glycol (SYSTANE BALANCE OP) Place 1-2 drops into both eyes 3 (three) times daily as needed (for dry eyes).     [name] senna-docusate (SENNA PLUS) 8.6-50 MG tablet Take 1 tablet by mouth daily as needed for moderate constipation.     . topiramate (TOPAMAX) 25 MG tablet Take 1 tablet (25 mg total) by mouth daily. 30 tablet 0  . vortioxetine HBr (TRINTELLIX) 20 MG TABS Take 20 mg by mouth at bedtime. 30 tablet 0   No current facility-administered medications for this visit.     OBJECTIVE:  Vitals:   01/20/18 1258  BP: (!) 127/59  Pulse: 64  Resp: 18  Temp: 97.9 F (36.6 C)  SpO2: 98%     Body mass index is 29.65 kg/m.    ECOG FS:1 - Symptomatic but completely ambulatory Filed Weights   01/20/18 1258  Weight: 200 lb 12.8 oz (91.1 kg)  GENERAL: Patient is a well appearing  female in no acute distress HEENT:  Sclerae anicteric.  Oropharynx clear and moist. No ulcerations or evidence of oropharyngeal candidiasis. Neck is supple.  NODES:  No cervical, supraclavicular, or axillary lymphadenopathy palpated.  BREAST EXAM:  Declined LUNGS:  Clear to auscultation bilaterally.  No wheezes or rhonchi. HEART:  Regular rate and rhythm. No murmur appreciated. ABDOMEN:  Soft, nontender.  Positive, normoactive bowel sounds. No organomegaly palpated. MSK:  No focal spinal tenderness to palpation. Full range of motion bilaterally in the upper extremities. EXTREMITIES:  No peripheral edema.   SKIN:  Clear with no obvious rashes or skin changes. No nail dyscrasia. NEURO:  Nonfocal. Well oriented.  Appropriate affect.    LAB RESULTS:  CMP     Component Value Date/Time   NA 141 01/13/2018 0823   NA 143 03/11/2017 0841   NA 140 01/07/2017 0758   K 4.3 01/13/2018 0823   K 4.1 01/07/2017 0758   CL 113 (H) 01/13/2018 0823   CO2 22 01/13/2018 0823   CO2 24 01/07/2017 0758   GLUCOSE 144 (H) 01/13/2018 0823   GLUCOSE 134 01/07/2017 0758   BUN 33 (H) 01/13/2018 0823   BUN 40 (H) 03/11/2017 0841   BUN 33.6 (H) 01/07/2017 0758   CREATININE 1.77 (H) 01/13/2018 0823   CREATININE 1.74 (H) 12/16/2017 0806   CREATININE 1.5 (H) 01/07/2017 0758   CALCIUM 9.1 01/13/2018 0823   CALCIUM 9.3 01/07/2017 0758   PROT 6.5 01/13/2018 0823   PROT 6.9 03/11/2017 0841   PROT 6.7 01/07/2017 0758   ALBUMIN 3.7 01/13/2018 0823   ALBUMIN 4.5 03/11/2017 0841   ALBUMIN  3.6 01/07/2017 0758   AST 47 (H) 01/13/2018 0823   AST 43 (H) 12/16/2017 0806   AST 51 (H) 01/07/2017 0758   ALT 65 (H) 01/13/2018 0823   ALT 57 (H) 12/16/2017 0806   ALT 82 (H) 01/07/2017 0758   ALKPHOS 155 (H) 01/13/2018 0823   ALKPHOS 115 01/07/2017 0758   BILITOT 0.6 01/13/2018 0823   BILITOT 0.6 12/16/2017 0806   BILITOT 0.72 01/07/2017 0758   GFRNONAA 30 (L) 01/13/2018 0823   GFRNONAA 30 (L) 12/16/2017 0806    GFRNONAA 55 (L) 02/18/2013 0827   GFRAA 34 (L) 01/13/2018 0823   GFRAA 35 (L) 12/16/2017 0806   GFRAA 63 02/18/2013 0827    INo results found for: SPEP, UPEP  Lab Results  Component Value Date   WBC 6.5 01/20/2018   NEUTROABS PENDING 01/20/2018   HGB 9.4 (L) 01/20/2018   HCT 28.4 (L) 01/20/2018   MCV 93.4 01/20/2018   PLT 77 (L) 01/20/2018      Chemistry      Component Value Date/Time   NA 141 01/13/2018 0823   NA 143 03/11/2017 0841   NA 140 01/07/2017 0758   K 4.3 01/13/2018 0823   K 4.1 01/07/2017 0758   CL 113 (H) 01/13/2018 0823   CO2 22 01/13/2018 0823   CO2 24 01/07/2017 0758   BUN 33 (H) 01/13/2018 0823   BUN 40 (H) 03/11/2017 0841   BUN 33.6 (H) 01/07/2017 0758   CREATININE 1.77 (H) 01/13/2018 0823   CREATININE 1.74 (H) 12/16/2017 0806   CREATININE 1.5 (H) 01/07/2017 0758      Component Value Date/Time   CALCIUM 9.1 01/13/2018 0823   CALCIUM 9.3 01/07/2017 0758   ALKPHOS 155 (H) 01/13/2018 0823   ALKPHOS 115 01/07/2017 0758   AST 47 (H) 01/13/2018 0823   AST 43 (H) 12/16/2017 0806   AST 51 (H) 01/07/2017 0758   ALT 65 (H) 01/13/2018 0823   ALT 57 (H) 12/16/2017 0806   ALT 82 (H) 01/07/2017 0758   BILITOT 0.6 01/13/2018 0823   BILITOT 0.6 12/16/2017 0806   BILITOT 0.72 01/07/2017 0758       No results found for: LABCA2  No components found for: VPXTG626  No results for input(s): INR in the last 168 hours.  Urinalysis    Component Value Date/Time   COLORURINE YELLOW 07/21/2015 [place] 07/21/2015 2328   LABSPEC 1.020 07/21/2015 2328   PHURINE 6.0 07/21/2015 2328   GLUCOSEU NEGATIVE 07/21/2015 2328   HGBUR TRACE (A) 07/21/2015 2328   BILIRUBINUR NEGATIVE 07/21/2015 2328   BILIRUBINUR neg 06/29/2013 1017   KETONESUR NEGATIVE 07/21/2015 2328   PROTEINUR 30 (A) 07/21/2015 2328   UROBILINOGEN negative 06/29/2013 1017   NITRITE NEGATIVE 07/21/2015 2328   LEUKOCYTESUR NEGATIVE 07/21/2015 2328    STUDIES: No results  found.   ASSESSMENT: 67 y.o. [place] woman status post right breast upper outer quadrant and right axillary lymph node biopsy 07/19/2014 for a cT2  pN1, stage IIB  invasive ductal carcinoma, grade 2, estrogen and progesterone receptor negative, with an MIB-1 of 90%, and HER-2 amplified  (1) neoadjuvant chemotherapy started 08/08/2014, consisting of carboplatin, docetaxel, trastuzumab and pertuzumab given every 3 weeks 6, completed 11/21/2014  (a) breast MRI after initial 3 cycles shows a significant response  (b) breast MRI after 6 cycles shows a complete radiologic response   (2) trastuzumab continued through 10/02/2015 (to end of capecitabine therapy)  (a) echo 02/13/2015 shows an EF of  60-65%  (b)  echo 05/09/2015 shows an ejection fraction of 60-65%  (c) echocardiogram 06/27/2015 shows an ejection fraction of 55-60%.  (d) echocardiogram 01/22/2017 shows an ejection fraction of 55-60%  (3) right lumpectomy with sentinel lymph node biopsy on 12/27/14 showed a residual  ypT2 ypN1a, stage IIB invasive ductal carcinoma, grade 2, with repeat estrogen and progesterone receptors again negative  (4) status post right oncoplastic surgery (with left reduction mammoplasty) 01/06/2015 showing in the right breast multiple foci of intralymphatic carcinoma in random sections  (5)  Right modified radical mastectomy 02/15/2014 showed no residual invasive disease; there was DCIS but margins were negative; all 15 axillary lymph nodes were clear; ypTis ypN0  (a)  The patient has decided against reconstruction.  (6)  Postmastectomy radiation completed 05/18/2015 with capecitabine sensitization  (7) adjuvant capecitabine continued through 09/27/2015  (8) hepatic steatosis with increased fibrosis score (at risk for cirrhosis)  (9) thyroid biopsy (left lobe inferiorly) 2 shows benign tissue 06/28/2015  (10) LOCAL RECURRENCE:  (a) PET scan 12/06/2015 Notes a right retropectoral lymph node measuring 0.9  cm with an SUV max of 5.5  (b) repeat PET scan 03/11/2016 finds the right subpectoral lymph node to now measure 1.3 cm with an SUV max of 11.8.  (c) PET scan 05/07/2016 shows a significant response to T-DM 1  (d) PET scan 07/10/2017 is clear except for what appears to be a single new liver lesion   (11) started T-DM1 03/21/2016, completed 8 doses 08/13/2016  (a) PET scan 08/19/2016 shows a good response but measurable residual disease  (b) PET scan on 01/16/2017 shows a continuing response  (12) maintenance trastuzumab started 09/03/2016--changed to every 28 days as of 01/28/2017  (a) repeat echocardiogram 07/08/2016 shows an ejection fraction of 60-65%.  (b) echocardiogram on 10/08/2016 demonstrates LVEF of 55-60% (followed by Dr. Haroldine Laws)  (c) echo 01/22/2017 shows an ejection fraction in the 55-60% range  (d) echo 04/24/2017 shows an ejection fraction in the 55-60% range (recommendation for echo every 6 months by Dr. Aundra Dubin)  (e) trastuzumab changed to T-DM 1 after trastuzumab dose 07/15/2017  METASTATIC DISEASE: July 2019 (13)  Radiation to the area of persistent disease in the right upper chest completed 10/30/2016   (14) T-DM 1 resumed 08/12/2017, repeated every 21 days  (a) baseline liver MRI 08/02/2017 shows a 1.4 cm mass, "hot" on PET 07/10/2017  (b) repeat liver MRI 10/22/2018 3:19 cycles of T-DM 1, shows slight enlargement  (c) T-DM 1 discontinued after 09/23/2017 dose  (15) trastuzumab resumed 11/18/2017, to be repeated every 4 weeks  (16) liver biopsy 11/03/2017 shows adenocarcinoma, most consistent with an upper gastrointestinal primary; this tumor was estrogen receptor and HER-2 negative  (a) GI workup for occult primary ([place]) negative  (b) PET 12/09/2017 shows no obvious primary.  There is a right subpectoral lymph node noted as well  (c) CA 27-29 and CA 19-9 uninformative; CEA pending   (17) to start Abraxane on 12/10, given days 1 and 8 on a 21 day cycle x 3, then  restaging with MRI liver 02/24/2018 tentatively  (18) consider Yttrium or other local treatment to liver per IR  PLAN:  Darlene [name] is doing well today.  Her WBC has improved from the Granix injection, however her plts have decreased.  I reviewed her labs with Dr. Jana Hakim in detail.  He recommended her dosage of abraxane be adjusted from 112m/m2 to 835mm2.  She will proceed with treatment today with this reduced dose per Dr. MaJana Hakim  Darlene [name] will return in one week for labs, f/u and potentially day 8 of her Abraxane.  She knows to call for any questions or concerns prior to her next appointment.    A total of (20) minutes of face-to-face time was spent with this patient with greater than 50% of that time in counseling and care-coordination.   Wilber Bihari, NP  01/20/18 1:11 PM Medical Oncology and Hematology [facility] [address] [place], [place] [address] Tel. [phone]    Fax. [phone]

## 2018-01-20 NOTE — Patient Instructions (Signed)
[facility] Discharge Instructions for Patients Receiving Chemotherapy  Today you received the following chemotherapy agents: Paclitaxel-protein bound (Abraxane)  To help prevent nausea and vomiting after your treatment, we encourage you to take your nausea medication as directed.    If you develop nausea and vomiting that is not controlled by your nausea medication, call the clinic.   BELOW ARE SYMPTOMS THAT SHOULD BE REPORTED IMMEDIATELY:  *FEVER GREATER THAN 100.5 F  *CHILLS WITH OR WITHOUT FEVER  NAUSEA AND VOMITING THAT IS NOT CONTROLLED WITH YOUR NAUSEA MEDICATION  *UNUSUAL SHORTNESS OF BREATH  *UNUSUAL BRUISING OR BLEEDING  TENDERNESS IN MOUTH AND THROAT WITH OR WITHOUT PRESENCE OF ULCERS  *URINARY PROBLEMS  *BOWEL PROBLEMS  UNUSUAL RASH Items with * indicate a potential emergency and should be followed up as soon as possible.  Feel free to call the clinic should you have any questions or concerns. The clinic phone number is [phone].  Please show the CHEMO ALERT CARD at check-in to the Emergency Department and triage nurse.   

## 2018-01-20 NOTE — Progress Notes (Signed)
Per Mendel Ryder Causey-NP: OK to treat with lab work from today

## 2018-01-22 ENCOUNTER — Telehealth: Payer: Self-pay | Admitting: Adult Health

## 2018-01-22 NOTE — Telephone Encounter (Signed)
Per 12/31 no los °

## 2018-01-26 ENCOUNTER — Other Ambulatory Visit (INDEPENDENT_AMBULATORY_CARE_PROVIDER_SITE_OTHER): Payer: Self-pay | Admitting: Family Medicine

## 2018-01-27 ENCOUNTER — Inpatient Hospital Stay: Payer: Medicare Other

## 2018-01-27 ENCOUNTER — Inpatient Hospital Stay: Payer: Medicare Other | Attending: Oncology

## 2018-01-27 ENCOUNTER — Telehealth: Payer: Self-pay | Admitting: *Deleted

## 2018-01-27 VITALS — BP 156/58 | HR 69 | Temp 98.0°F | Resp 18 | Wt 195.8 lb

## 2018-01-27 DIAGNOSIS — C50411 Malignant neoplasm of upper-outer quadrant of right female breast: Secondary | ICD-10-CM | POA: Insufficient documentation

## 2018-01-27 DIAGNOSIS — C801 Malignant (primary) neoplasm, unspecified: Secondary | ICD-10-CM

## 2018-01-27 DIAGNOSIS — Z95828 Presence of other vascular implants and grafts: Secondary | ICD-10-CM

## 2018-01-27 DIAGNOSIS — Z5112 Encounter for antineoplastic immunotherapy: Secondary | ICD-10-CM | POA: Insufficient documentation

## 2018-01-27 DIAGNOSIS — C787 Secondary malignant neoplasm of liver and intrahepatic bile duct: Secondary | ICD-10-CM | POA: Insufficient documentation

## 2018-01-27 DIAGNOSIS — Z171 Estrogen receptor negative status [ER-]: Secondary | ICD-10-CM

## 2018-01-27 DIAGNOSIS — Z5111 Encounter for antineoplastic chemotherapy: Secondary | ICD-10-CM | POA: Insufficient documentation

## 2018-01-27 DIAGNOSIS — D696 Thrombocytopenia, unspecified: Secondary | ICD-10-CM | POA: Diagnosis not present

## 2018-01-27 DIAGNOSIS — C50911 Malignant neoplasm of unspecified site of right female breast: Secondary | ICD-10-CM

## 2018-01-27 LAB — COMPREHENSIVE METABOLIC PANEL
ALT: 52 U/L — ABNORMAL HIGH (ref 0–44)
AST: 41 U/L (ref 15–41)
Albumin: 3.6 g/dL (ref 3.5–5.0)
Alkaline Phosphatase: 172 U/L — ABNORMAL HIGH (ref 38–126)
Anion gap: 8 (ref 5–15)
BUN: 33 mg/dL — ABNORMAL HIGH (ref 8–23)
CO2: 20 mmol/L — ABNORMAL LOW (ref 22–32)
Calcium: 9.3 mg/dL (ref 8.9–10.3)
Chloride: 113 mmol/L — ABNORMAL HIGH (ref 98–111)
Creatinine, Ser: 1.7 mg/dL — ABNORMAL HIGH (ref 0.44–1.00)
GFR calc Af Amer: 36 mL/min — ABNORMAL LOW (ref >60)
GFR calc non Af Amer: 31 mL/min — ABNORMAL LOW (ref >60)
Glucose, Bld: 144 mg/dL — ABNORMAL HIGH (ref 70–99)
Potassium: 4.2 mmol/L (ref 3.5–5.1)
Sodium: 141 mmol/L (ref 135–145)
Total Bilirubin: 0.7 mg/dL (ref 0.3–1.2)
Total Protein: 6.5 g/dL (ref 6.5–8.1)

## 2018-01-27 LAB — CBC WITH DIFFERENTIAL/PLATELET
Abs Immature Granulocytes: 0.03 10*3/uL (ref 0.00–0.07)
Basophils Absolute: 0 10*3/uL (ref 0.0–0.1)
Basophils Relative: 1 %
Eosinophils Absolute: 0.2 10*3/uL (ref 0.0–0.5)
Eosinophils Relative: 4 %
HCT: 27.1 % — ABNORMAL LOW (ref 36.0–46.0)
Hemoglobin: 9 g/dL — ABNORMAL LOW (ref 12.0–15.0)
Immature Granulocytes: 1 %
Lymphocytes Relative: 24 %
Lymphs Abs: 0.8 10*3/uL (ref 0.7–4.0)
MCH: 30.7 pg (ref 26.0–34.0)
MCHC: 33.2 g/dL (ref 30.0–36.0)
MCV: 92.5 fL (ref 80.0–100.0)
Monocytes Absolute: 0.2 10*3/uL (ref 0.1–1.0)
Monocytes Relative: 7 %
Neutro Abs: 2.1 10*3/uL (ref 1.7–7.7)
Neutrophils Relative %: 63 %
Platelets: 135 10*3/uL — ABNORMAL LOW (ref 150–400)
RBC: 2.93 MIL/uL — ABNORMAL LOW (ref 3.87–5.11)
RDW: 14.5 % (ref 11.5–15.5)
WBC: 3.4 10*3/uL — ABNORMAL LOW (ref 4.0–10.5)
nRBC: 0 % (ref 0.0–0.2)

## 2018-01-27 MED ORDER — PACLITAXEL PROTEIN-BOUND CHEMO INJECTION 100 MG
80.0000 mg/m2 | Freq: Once | INTRAVENOUS | Status: AC
  Administered 2018-01-27: 175 mg via INTRAVENOUS
  Filled 2018-01-27: qty 35

## 2018-01-27 MED ORDER — PROCHLORPERAZINE MALEATE 10 MG PO TABS
10.0000 mg | ORAL_TABLET | Freq: Once | ORAL | Status: AC
  Administered 2018-01-27: 10 mg via ORAL

## 2018-01-27 MED ORDER — HEPARIN SOD (PORK) LOCK FLUSH 100 UNIT/ML IV SOLN
500.0000 [IU] | Freq: Once | INTRAVENOUS | Status: AC | PRN
  Administered 2018-01-27: 500 [IU]
  Filled 2018-01-27: qty 5

## 2018-01-27 MED ORDER — SODIUM CHLORIDE 0.9% FLUSH
10.0000 mL | INTRAVENOUS | Status: DC | PRN
  Administered 2018-01-27: 10 mL
  Filled 2018-01-27: qty 10

## 2018-01-27 MED ORDER — SODIUM CHLORIDE 0.9 % IJ SOLN
10.0000 mL | INTRAMUSCULAR | Status: DC | PRN
  Administered 2018-01-27: 10 mL via INTRAVENOUS
  Filled 2018-01-27: qty 10

## 2018-01-27 MED ORDER — PROCHLORPERAZINE MALEATE 10 MG PO TABS
ORAL_TABLET | ORAL | Status: AC
  Filled 2018-01-27: qty 1

## 2018-01-27 MED ORDER — SODIUM CHLORIDE 0.9 % IV SOLN
Freq: Once | INTRAVENOUS | Status: AC
  Administered 2018-01-27: 11:00:00 via INTRAVENOUS
  Filled 2018-01-27: qty 250

## 2018-01-27 NOTE — Progress Notes (Signed)
Confirmed w/ Val Dodd,RN that Dr. Jana Hakim did not need to see pt today despite note from Wilber Bihari, NP on 01/20/18 stating pt would come in for f/u in 1 week. MD has reviewed labs from today.  Kennith Center, Pharm.D., CPP 01/27/2018@11 :15 AM

## 2018-01-27 NOTE — Telephone Encounter (Signed)
Proceed with abraxane per MD with creatinine of 1.7.

## 2018-01-27 NOTE — Patient Instructions (Signed)
[facility] Discharge Instructions for Patients Receiving Chemotherapy   Today you received the following chemotherapy agents Abraxane.   To help prevent nausea and vomiting after your treatment, we encourage you to take your nausea medicationas prescribed.  If you develop nausea and vomiting that is not controlled by your nausea medication, call the clinic.   BELOW ARE SYMPTOMS THAT SHOULD BE REPORTED IMMEDIATELY:  *FEVER GREATER THAN 100.5 F  *CHILLS WITH OR WITHOUT FEVER  NAUSEA AND VOMITING THAT IS NOT CONTROLLED WITH YOUR NAUSEA MEDICATION  *UNUSUAL SHORTNESS OF BREATH  *UNUSUAL BRUISING OR BLEEDING  TENDERNESS IN MOUTH AND THROAT WITH OR WITHOUT PRESENCE OF ULCERS  *URINARY PROBLEMS  *BOWEL PROBLEMS  UNUSUAL RASH Items with * indicate a potential emergency and should be followed up as soon as possible.  Feel free to call the clinic should you have any questions or concerns. The clinic phone number is [phone].  Please show the CHEMO ALERT CARD at check-in to the Emergency Department and triage nurse.  

## 2018-01-29 ENCOUNTER — Other Ambulatory Visit (INDEPENDENT_AMBULATORY_CARE_PROVIDER_SITE_OTHER): Payer: Self-pay | Admitting: Family Medicine

## 2018-02-02 ENCOUNTER — Encounter (INDEPENDENT_AMBULATORY_CARE_PROVIDER_SITE_OTHER): Payer: Self-pay | Admitting: Family Medicine

## 2018-02-02 ENCOUNTER — Ambulatory Visit (INDEPENDENT_AMBULATORY_CARE_PROVIDER_SITE_OTHER): Payer: Medicare Other | Admitting: Family Medicine

## 2018-02-02 VITALS — BP 114/67 | HR 59 | Temp 98.2°F | Ht 69.0 in | Wt 194.0 lb

## 2018-02-02 DIAGNOSIS — E119 Type 2 diabetes mellitus without complications: Secondary | ICD-10-CM | POA: Diagnosis not present

## 2018-02-02 DIAGNOSIS — E669 Obesity, unspecified: Secondary | ICD-10-CM

## 2018-02-02 DIAGNOSIS — Z794 Long term (current) use of insulin: Secondary | ICD-10-CM

## 2018-02-02 DIAGNOSIS — F418 Other specified anxiety disorders: Secondary | ICD-10-CM | POA: Diagnosis not present

## 2018-02-02 DIAGNOSIS — I10 Essential (primary) hypertension: Secondary | ICD-10-CM

## 2018-02-02 DIAGNOSIS — Z6830 Body mass index (BMI) 30.0-30.9, adult: Secondary | ICD-10-CM | POA: Diagnosis not present

## 2018-02-02 MED ORDER — METFORMIN HCL 500 MG PO TABS: 500.0000 mg | ORAL_TABLET | Freq: Every day | ORAL | 0 refills | Status: DC

## 2018-02-02 MED ORDER — LOSARTAN POTASSIUM 100 MG PO TABS: 100.0000 mg | ORAL_TABLET | Freq: Every day | ORAL | 0 refills | Status: DC

## 2018-02-02 MED ORDER — BUPROPION HCL ER (SR) 200 MG PO TB12: ORAL_TABLET | ORAL | 0 refills | Status: DC

## 2018-02-02 MED ORDER — INSULIN PEN NEEDLE 32G X 4 MM MISC: 1.0000 | Freq: Every day | 0 refills | Status: DC

## 2018-02-02 MED ORDER — TOPIRAMATE 25 MG PO TABS: 25.0000 mg | ORAL_TABLET | Freq: Every day | ORAL | 0 refills | Status: DC

## 2018-02-02 MED ORDER — BASAGLAR KWIKPEN 100 UNIT/ML ~~LOC~~ SOPN: 12.0000 [IU] | PEN_INJECTOR | Freq: Every day | SUBCUTANEOUS | 0 refills | Status: DC

## 2018-02-02 MED ORDER — LIRAGLUTIDE 18 MG/3ML ~~LOC~~ SOPN: PEN_INJECTOR | SUBCUTANEOUS | 0 refills | Status: DC

## 2018-02-04 ENCOUNTER — Other Ambulatory Visit (INDEPENDENT_AMBULATORY_CARE_PROVIDER_SITE_OTHER): Payer: Self-pay

## 2018-02-04 NOTE — Progress Notes (Signed)
Office: [phone]  /  Fax: [phone]   HPI:   Chief Complaint: OBESITY Jorja is here to discuss her progress with her obesity treatment plan. She is on the keep a food journal with 1300-1500 calories and 80+ grams of protein daily and is following her eating plan approximately 60 % of the time. She states she is exercising 0 minutes 0 times per week. Sanaii is going through chemotherapy and has lost her hair and notes some increased fatigue. She notes her appetite is pretty good. She did well avoiding weight gain over the holidays.  Her weight is 194 lb (88 kg) today and has not lost weight since her last visit. She has lost 31 lbs since starting treatment with [name].  Diabetes II Fairy has a diagnosis of diabetes type II. Media states she is doing well on diet. She did not bring BGs log today. Her last A1c was 5.8, which has greatly improved from her last at 8.1 when she started. She has been working on intensive lifestyle modifications including diet, exercise, and weight loss to help control her blood glucose levels.  Hypertension RHETT NAJERA is a 68 y.o. female with hypertension. Kylan's blood pressure is well controlled. She denies chest pain or headaches. She is working on diet and weight loss to help control her blood pressure with the goal of decreasing her risk of heart attack and stroke.   Depression with emotional eating behaviors Tiffanny's mood is stable on medications and she is coping well with her cancer diagnosis and treatment. Shaynah struggles with emotional eating and using food for comfort to the extent that it is negatively impacting her health. She often snacks when she is not hungry. Molli sometimes feels she is out of control and then feels guilty that she made poor food choices. She has been working on behavior modification techniques to help reduce her emotional eating and has been somewhat successful. She shows no sign of suicidal or  homicidal ideations.  Depression screen [facility] 2/9 09/12/2016 08/09/2016  Decreased Interest 0 1  Down, Depressed, Hopeless 0 1  PHQ - 2 Score 0 2  Altered sleeping - 0  Tired, decreased energy - 1  Change in appetite - 2  Feeling bad or failure about yourself  - 2  Trouble concentrating - 1  Moving slowly or fidgety/restless - 1  Suicidal thoughts - 0  PHQ-9 Score - 9  Some recent data might be hidden    ASSESSMENT AND PLAN:  Type 2 diabetes mellitus without complication, with long-term current use of insulin (HCC) - Plan: Insulin Glargine (BASAGLAR KWIKPEN) 100 UNIT/ML SOPN, liraglutide (VICTOZA) 18 MG/3ML SOPN, metFORMIN (GLUCOPHAGE) 500 MG tablet, Insulin Pen Needle (BD PEN NEEDLE NANO U/F) 32G X 4 MM MISC  Depression with anxiety - Plan: topiramate (TOPAMAX) 25 MG tablet, buPROPion (WELLBUTRIN SR) 200 MG 12 hr tablet  Essential hypertension - Plan: losartan (COZAAR) 100 MG tablet  Class 1 obesity with serious comorbidity and body mass index (BMI) of 30.0 to 30.9 in adult, unspecified obesity type - Starting BMI greater then 30  PLAN:  Diabetes II Doyle has been given extensive diabetes education by myself today including ideal fasting and post-prandial blood glucose readings, individual ideal Hgb A1c goals and hypoglycemia prevention. We discussed the importance of good blood sugar control to decrease the likelihood of diabetic complications such as nephropathy, neuropathy, limb loss, blindness, coronary artery disease, and death. We discussed the importance of intensive lifestyle modification including diet, exercise and  weight loss as the first line treatment for diabetes. Nazareth agrees to change to lantus, per insurance coverage, 12 units SubQ daily #5 pens and we will refill for 1 month; she agrees to continue Victoza 1.8 mg SubQ AM #3 pens and we will refill for 1 month. Soffia agrees to continue taking metformin 500 mg qd #30 and we will refill for 1 month. We will refill  pen needles #100 with no refills. Zoiee agrees to follow up with our clinic in 3 weeks.  Hypertension We discussed sodium restriction, working on healthy weight loss, and a regular exercise program as the means to achieve improved blood pressure control. Shacoya agreed with this plan and agreed to follow up as directed. We will continue to monitor her blood pressure as well as her progress with the above lifestyle modifications. Corine agrees to continue taking losartan 100 mg qd #30 and we will refill for 1 month. She will continue diet and exercise, and will watch for signs of hypotension as she continues her lifestyle modifications. Gabriele agrees to follow up with our clinic in 3 weeks.  Depression with Emotional Eating Behaviors We discussed behavior modification techniques today to help Jeniya deal with her emotional eating and depression. Jasenia agrees to continue taking Topamax 25 mg qd #30 and we will refill for 1 month, and she agrees to continue taking Wellbutrin SR 200 mg qd #30 and we will refill for 1 month. Nobie agrees to follow up with our clinic in 3 weeks.  Obesity Seda is currently in the action stage of change. As such, her goal is to continue with weight loss efforts She has agreed to follow the Category 2 plan Copeland has been instructed to work up to a goal of 150 minutes of combined cardio and strengthening exercise per week for weight loss and overall health benefits. We discussed the following Behavioral Modification Strategies today: increasing lean protein intake and increasing vegetables and fruit The goal is to maintain lean muscle mass versus weight loss currently.  Faythe has agreed to follow up with our clinic in 3 weeks. She was informed of the importance of frequent follow up visits to maximize her success with intensive lifestyle modifications for her multiple health conditions.  ALLERGIES: Allergies  Allergen Reactions  . Nsaids Other  (See Comments)    H/o gastric bypass - avoid NSAIDs  . Tape Hives, Rash and Other (See Comments)    Adhesives in bandaids    MEDICATIONS: Current Outpatient Medications on File Prior to Visit  Medication Sig Dispense Refill  . acetaminophen (TYLENOL) 325 MG tablet Take 650 mg by mouth 2 (two) times daily as needed for moderate pain or headache.     [name] CALCIUM CITRATE-VITAMIN D PO Take 1 tablet by mouth 3 (three) times daily.     . cholecalciferol (VITAMIN D) 1000 units tablet Take 1,000 Units by mouth daily.    . Cyanocobalamin (VITAMIN B-12 PO) Take 1 tablet by mouth every 14 (fourteen) days.    . diphenhydrAMINE (BENADRYL) 25 MG tablet Take 25 mg by mouth daily as needed for allergies or sleep.     [name] loratadine (CLARITIN) 10 MG tablet Take 10 mg by mouth as needed for allergies.    . Melatonin 3 MG CAPS Take 3 mg by mouth at bedtime.     Glory Rosebush DELICA LANCETS 85I MISC 1 Package by Does not apply route daily. 100 each 0  . Pediatric Multivitamins-Iron (FLINTSTONES PLUS IRON) chewable tablet Chew 1 tablet  by mouth 2 (two) times daily.    . Probiotic Product ([employer] PROBIOTIC PO) Take 1 capsule by mouth daily.     . prochlorperazine (COMPAZINE) 10 MG tablet Take 1 tablet (10 mg total) by mouth every 6 (six) hours as needed for nausea or vomiting. 30 tablet 0  . Propylene Glycol (SYSTANE BALANCE OP) Place 1-2 drops into both eyes 3 (three) times daily as needed (for dry eyes).     [name] senna-docusate (SENNA PLUS) 8.6-50 MG tablet Take 1 tablet by mouth daily as needed for moderate constipation.     . vortioxetine HBr (TRINTELLIX) 20 MG TABS Take 20 mg by mouth at bedtime. 30 tablet 0   No current facility-administered medications on file prior to visit.     PAST MEDICAL HISTORY: Past Medical History:  Diagnosis Date  . Anemia    hx of - during 1st round chemo  . Anxiety   . Arthritis    in fingers, shoulders  . Back pain   . Balance problem   . Breast cancer ([place])   . Breast cancer  of upper-outer quadrant of right female breast ([place]) 07/22/2014  . Bunion, right foot   . Clumsiness   . Constipation   . Depression   . Diabetes mellitus without complication ([place])    93+ years  . Eating disorder    binge eating  . Epistaxis 08/26/2014  . Fatty liver   . HCAP (healthcare-associated pneumonia) 12/18/2014  . Headache   . Heart murmur    never had any problems  . History of radiation therapy 04/05/15-05/19/15   right chest wall was treated to 50.4 Gy in 28 fractions, right mastectomy scar was treated to 10 Gy in 5 fractions  . Hyperlipidemia   . Hypertension   . Joint pain   . Multiple food allergies   . Neuromuscular disorder ([place])    diabetic neuropathy  . Obesity   . Obstructive sleep apnea on CPAP    uses cpap nightly  . Ovarian cyst rupture    possible  . Pneumonia   . Pneumonia 11/2014  . Radiation 04/05/15-05/19/15   right chest wall 50.4 Gy, mastectomy scar 10 Gy  . Sleep apnea   . Thyroid nodule 06/2014    PAST SURGICAL HISTORY: Past Surgical History:  Procedure Laterality Date  . APPENDECTOMY    . BIOPSY THYROID Bilateral 06/2014   2 nodules - Benign   . BREAST LUMPECTOMY WITH RADIOACTIVE SEED AND SENTINEL LYMPH NODE BIOPSY Right 12/27/2014   Procedure: BREAST LUMPECTOMY WITH RADIOACTIVE SEED AND SENTINEL LYMPH NODE BIOPSY;  Surgeon: Stark Klein, MD;  Location: [facility];  Service: General;  Laterality: Right;  . BREAST REDUCTION SURGERY Bilateral 01/06/2015   Procedure: BILATERAL BREAST REDUCTION, RIGHT ONCOPLASTIC RECONSTRUCTION),LEFT BREAST REDUCTION FOR SYMMETRY;  Surgeon: [name], MD;  Location: [place];  Service: Plastics;  Laterality: Bilateral;  . BREATH TEK H PYLORI N/A 09/15/2012   Procedure: BREATH TEK H PYLORI;  Surgeon: Pedro Earls, MD;  Location: [facility];  Service: General;  Laterality: N/A;  . BUNIONECTOMY Left 12/2013  . CARPAL TUNNEL RELEASE Right   . CARPAL TUNNEL RELEASE Left   . CATARACT EXTRACTION      6/19  . COLONOSCOPY    . DUPUYTREN CONTRACTURE RELEASE    . GASTRIC ROUX-EN-Y N/A 11/02/2012   Procedure: LAPAROSCOPIC ROUX-EN-Y GASTRIC;  Surgeon: Pedro Earls, MD;  Location: [facility];  Service: General;  Laterality: N/A;  . IR GENERIC HISTORICAL  03/18/2016   IR US GUIDE VASC ACCESS LEFT 03/18/2016 Markus Daft, MD [facility]  . IR GENERIC HISTORICAL  03/18/2016   IR FLUORO GUIDE CV LINE LEFT 03/18/2016 Markus Daft, MD [facility]  . LAPAROSCOPIC APPENDECTOMY N/A 07/22/2015   Procedure: APPENDECTOMY LAPAROSCOPIC;  Surgeon: Michael Boston, MD;  Location: [facility];  Service: General;  Laterality: N/A;  . MASTECTOMY Right 02/15/2015   modified  . MODIFIED MASTECTOMY Right 02/15/2015   Procedure: RIGHT MODIFIED RADICAL MASTECTOMY;  Surgeon: Stark Klein, MD;  Location: [place];  Service: General;  Laterality: Right;  . PORT-A-CATH REMOVAL Left 10/10/2015   Procedure: PORT REMOVAL;  Surgeon: Stark Klein, MD;  Location: [place];  Service: General;  Laterality: Left;  . PORTACATH PLACEMENT Left 08/02/2014   Procedure: INSERTION PORT-A-CATH;  Surgeon: Stark Klein, MD;  Location: [place];  Service: General;  Laterality: Left;  . PORTACATH PLACEMENT N/A 11/05/2016   Procedure: INSERTION PORT-A-CATH;  Surgeon: Stark Klein, MD;  Location: [place];  Service: General;  Laterality: N/A;  . ROTATOR CUFF REPAIR Right 2005  . SCAR REVISION Right 12/08/2015   Procedure: COMPLEX REPAIR RIGHT CHEST 10-15CM;  Surgeon: [name], MD;  Location: [place];  Service: Plastics;  Laterality: Right;  . TOE SURGERY  1970s   bone spur  . TRIGGER FINGER RELEASE     x3    SOCIAL HISTORY: Social History   Tobacco Use  . Smoking status: Never Smoker  . Smokeless tobacco: Never Used  Substance Use Topics  . Alcohol use: Yes    Alcohol/week: 1.0 standard drinks    Types: 1 Standard drinks or equivalent per week    Comment: social  . Drug use: No    FAMILY HISTORY: Family  History  Problem Relation Age of Onset  . CVA Mother   . Heart disease Mother   . Sudden death Mother   . Diabetes Father   . Heart failure Father   . Hypertension Father   . Kidney disease Father   . Obesity Father   . Hypertension Sister   . Diabetes Brother   . Breast cancer Paternal Grandmother 50  . Diabetes Paternal Uncle   . Ovarian cancer Neg Hx     ROS: Review of Systems  Constitutional: Negative for weight loss.  Cardiovascular: Negative for chest pain.  Neurological: Negative for headaches.  Endo/Heme/Allergies:       Negative hypoglycemia  Psychiatric/Behavioral: Positive for depression. Negative for suicidal ideas.    PHYSICAL EXAM: Blood pressure 114/67, pulse (!) 59, temperature 98.2 F (36.8 C), temperature source Oral, height 5\' 9"  (1.753 m), weight 194 lb (88 kg), last menstrual period 10/22/2007, SpO2 100 %. Body mass index is 28.65 kg/m. Physical Exam Vitals signs reviewed.  Constitutional:      Appearance: Normal appearance. She is obese.  Cardiovascular:     Rate and Rhythm: Normal rate.     Pulses: Normal pulses.  Pulmonary:     Effort: Pulmonary effort is normal.     Breath sounds: Normal breath sounds.  Musculoskeletal: Normal range of motion.  Skin:    General: Skin is warm and dry.  Neurological:     Mental Status: She is alert and oriented to person, place, and time.  Psychiatric:        Mood and Affect: Mood normal.        Behavior: Behavior normal.     RECENT LABS AND TESTS: BMET    Component Value Date/Time  NA 141 01/27/2018 0846   NA 143 03/11/2017 0841   NA 140 01/07/2017 0758   K 4.2 01/27/2018 0846   K 4.1 01/07/2017 0758   CL 113 (H) 01/27/2018 0846   CO2 20 (L) 01/27/2018 0846   CO2 24 01/07/2017 0758   GLUCOSE 144 (H) 01/27/2018 0846   GLUCOSE 134 01/07/2017 0758   BUN 33 (H) 01/27/2018 0846   BUN 40 (H) 03/11/2017 0841   BUN 33.6 (H) 01/07/2017 0758   CREATININE 1.70 (H) 01/27/2018 0846   CREATININE 1.74  (H) 12/16/2017 0806   CREATININE 1.5 (H) 01/07/2017 0758   CALCIUM 9.3 01/27/2018 0846   CALCIUM 9.3 01/07/2017 0758   GFRNONAA 31 (L) 01/27/2018 0846   GFRNONAA 30 (L) 12/16/2017 0806   GFRNONAA 55 (L) 02/18/2013 0827   GFRAA 36 (L) 01/27/2018 0846   GFRAA 35 (L) 12/16/2017 0806   GFRAA 63 02/18/2013 0827   Lab Results  Component Value Date   HGBA1C 5.8 (H) 09/03/2017   HGBA1C 6.1 (H) 03/11/2017   HGBA1C 8.2 (H) 11/05/2016   HGBA1C 8.1 (H) 08/09/2016   HGBA1C 6.5 (H) 01/03/2015   Lab Results  Component Value Date   INSULIN 15.3 03/11/2017   INSULIN 18.3 08/09/2016   CBC    Component Value Date/Time   WBC 3.4 (L) 01/27/2018 0846   RBC 2.93 (L) 01/27/2018 0846   HGB 9.0 (L) 01/27/2018 0846   HGB 10.6 (L) 12/16/2017 0806   HGB 10.8 (L) 01/07/2017 0758   HCT 27.1 (L) 01/27/2018 0846   HCT 31.9 (L) 01/07/2017 0758   PLT 135 (L) 01/27/2018 0846   PLT 83 (L) 12/16/2017 0806   PLT 126 (L) 01/07/2017 0758   MCV 92.5 01/27/2018 0846   MCV 90.8 01/07/2017 0758   MCH 30.7 01/27/2018 0846   MCHC 33.2 01/27/2018 0846   RDW 14.5 01/27/2018 0846   RDW 14.8 (H) 01/07/2017 0758   LYMPHSABS 0.8 01/27/2018 0846   LYMPHSABS 1.1 01/07/2017 0758   MONOABS 0.2 01/27/2018 0846   MONOABS 0.3 01/07/2017 0758   EOSABS 0.2 01/27/2018 0846   EOSABS 0.1 01/07/2017 0758   EOSABS 0.1 08/09/2016 1229   BASOSABS 0.0 01/27/2018 0846   BASOSABS 0.0 01/07/2017 0758   Iron/TIBC/Ferritin/ %Sat    Component Value Date/Time   IRON 58 02/18/2013 0827   TIBC 305 02/18/2013 0827   FERRITIN 133 10/31/2014 0816   IRONPCTSAT 19 (L) 02/18/2013 0827   Lipid Panel     Component Value Date/Time   CHOL 160 09/03/2017 1349   TRIG 111 09/03/2017 1349   HDL 59 09/03/2017 1349   CHOLHDL 2.9 02/18/2013 0827   VLDL 23 02/18/2013 0827   LDLCALC 79 09/03/2017 1349   Hepatic Function Panel     Component Value Date/Time   PROT 6.5 01/27/2018 0846   PROT 6.9 03/11/2017 0841   PROT 6.7 01/07/2017 0758    ALBUMIN 3.6 01/27/2018 0846   ALBUMIN 4.5 03/11/2017 0841   ALBUMIN 3.6 01/07/2017 0758   AST 41 01/27/2018 0846   AST 43 (H) 12/16/2017 0806   AST 51 (H) 01/07/2017 0758   ALT 52 (H) 01/27/2018 0846   ALT 57 (H) 12/16/2017 0806   ALT 82 (H) 01/07/2017 0758   ALKPHOS 172 (H) 01/27/2018 0846   ALKPHOS 115 01/07/2017 0758   BILITOT 0.7 01/27/2018 0846   BILITOT 0.6 12/16/2017 0806   BILITOT 0.72 01/07/2017 0758      Component Value Date/Time   TSH 1.54 02/07/2017  TSH 2.170 08/09/2016 1229      OBESITY BEHAVIORAL INTERVENTION VISIT  Today's visit was # 30  Starting weight: 225 lbs Starting date: 08/09/16 Today's weight : 194 lbs Today's date: 02/02/2018 Total lbs lost to date: 31 At least 15 minutes were spent on discussing the following behavioral intervention visit.   ASK: We discussed the diagnosis of obesity with [name] today and Cicily agreed to give [name] permission to discuss obesity behavioral modification therapy today.  ASSESS: Reylynn has the diagnosis of obesity and her BMI today is 28.64 Kearstin is in the action stage of change   ADVISE: Nickisha was educated on the multiple health risks of obesity as well as the benefit of weight loss to improve her health. She was advised of the need for long term treatment and the importance of lifestyle modifications to improve her current health and to decrease her risk of future health problems.  AGREE: Multiple dietary modification options and treatment options were discussed and  Reyes agreed to follow the recommendations documented in the above note.  ARRANGE: Tymeka was educated on the importance of frequent visits to treat obesity as outlined per CMS and USPSTF guidelines and agreed to schedule her next follow up appointment today.  I, [name], am acting as transcriptionist for Dennard Nip, MD  I have reviewed the above documentation for accuracy and completeness, and I agree with  the above. -Dennard Nip, MD

## 2018-02-05 MED ORDER — INSULIN GLARGINE 100 UNIT/ML SOLOSTAR PEN: 12.0000 [IU] | PEN_INJECTOR | Freq: Every day | SUBCUTANEOUS | 99 refills | Status: DC

## 2018-02-10 ENCOUNTER — Ambulatory Visit: Payer: Medicare Other

## 2018-02-10 ENCOUNTER — Other Ambulatory Visit: Payer: Medicare Other

## 2018-02-10 ENCOUNTER — Inpatient Hospital Stay (HOSPITAL_BASED_OUTPATIENT_CLINIC_OR_DEPARTMENT_OTHER): Payer: Medicare Other | Admitting: Adult Health

## 2018-02-10 ENCOUNTER — Inpatient Hospital Stay: Payer: Medicare Other

## 2018-02-10 ENCOUNTER — Other Ambulatory Visit: Payer: Self-pay | Admitting: Hematology and Oncology

## 2018-02-10 VITALS — BP 129/55 | HR 65 | Temp 97.8°F | Resp 18 | Ht 69.0 in | Wt 198.5 lb

## 2018-02-10 DIAGNOSIS — C50411 Malignant neoplasm of upper-outer quadrant of right female breast: Secondary | ICD-10-CM

## 2018-02-10 DIAGNOSIS — Z171 Estrogen receptor negative status [ER-]: Secondary | ICD-10-CM

## 2018-02-10 DIAGNOSIS — C787 Secondary malignant neoplasm of liver and intrahepatic bile duct: Secondary | ICD-10-CM

## 2018-02-10 DIAGNOSIS — Z95828 Presence of other vascular implants and grafts: Secondary | ICD-10-CM

## 2018-02-10 DIAGNOSIS — C801 Malignant (primary) neoplasm, unspecified: Secondary | ICD-10-CM

## 2018-02-10 DIAGNOSIS — C50911 Malignant neoplasm of unspecified site of right female breast: Secondary | ICD-10-CM

## 2018-02-10 DIAGNOSIS — D696 Thrombocytopenia, unspecified: Secondary | ICD-10-CM | POA: Diagnosis not present

## 2018-02-10 DIAGNOSIS — Z5112 Encounter for antineoplastic immunotherapy: Secondary | ICD-10-CM | POA: Diagnosis not present

## 2018-02-10 LAB — CBC WITH DIFFERENTIAL/PLATELET
Abs Immature Granulocytes: 0.01 10*3/uL (ref 0.00–0.07)
Basophils Absolute: 0 10*3/uL (ref 0.0–0.1)
Basophils Relative: 1 %
Eosinophils Absolute: 0.1 10*3/uL (ref 0.0–0.5)
Eosinophils Relative: 2 %
HCT: 28.1 % — ABNORMAL LOW (ref 36.0–46.0)
Hemoglobin: 9.2 g/dL — ABNORMAL LOW (ref 12.0–15.0)
Immature Granulocytes: 0 %
Lymphocytes Relative: 21 %
Lymphs Abs: 0.7 10*3/uL (ref 0.7–4.0)
MCH: 30.7 pg (ref 26.0–34.0)
MCHC: 32.7 g/dL (ref 30.0–36.0)
MCV: 93.7 fL (ref 80.0–100.0)
Monocytes Absolute: 0.4 10*3/uL (ref 0.1–1.0)
Monocytes Relative: 12 %
Neutro Abs: 2.1 10*3/uL (ref 1.7–7.7)
Neutrophils Relative %: 64 %
Platelets: 129 10*3/uL — ABNORMAL LOW (ref 150–400)
RBC: 3 MIL/uL — ABNORMAL LOW (ref 3.87–5.11)
RDW: 16 % — ABNORMAL HIGH (ref 11.5–15.5)
WBC: 3.3 10*3/uL — ABNORMAL LOW (ref 4.0–10.5)
nRBC: 0 % (ref 0.0–0.2)

## 2018-02-10 LAB — COMPREHENSIVE METABOLIC PANEL
ALT: 48 U/L — ABNORMAL HIGH (ref 0–44)
AST: 33 U/L (ref 15–41)
Albumin: 3.6 g/dL (ref 3.5–5.0)
Alkaline Phosphatase: 154 U/L — ABNORMAL HIGH (ref 38–126)
Anion gap: 9 (ref 5–15)
BUN: 28 mg/dL — ABNORMAL HIGH (ref 8–23)
CO2: 21 mmol/L — ABNORMAL LOW (ref 22–32)
Calcium: 9 mg/dL (ref 8.9–10.3)
Chloride: 112 mmol/L — ABNORMAL HIGH (ref 98–111)
Creatinine, Ser: 1.5 mg/dL — ABNORMAL HIGH (ref 0.44–1.00)
GFR calc Af Amer: 42 mL/min — ABNORMAL LOW (ref >60)
GFR calc non Af Amer: 36 mL/min — ABNORMAL LOW (ref >60)
Glucose, Bld: 161 mg/dL — ABNORMAL HIGH (ref 70–99)
Potassium: 4 mmol/L (ref 3.5–5.1)
Sodium: 142 mmol/L (ref 135–145)
Total Bilirubin: 0.7 mg/dL (ref 0.3–1.2)
Total Protein: 6.3 g/dL — ABNORMAL LOW (ref 6.5–8.1)

## 2018-02-10 MED ORDER — TRASTUZUMAB-ANNS CHEMO 420 MG IV SOLR
6.0000 mg/kg | Freq: Once | INTRAVENOUS | Status: AC
  Administered 2018-02-10: 546 mg via INTRAVENOUS
  Filled 2018-02-10: qty 20

## 2018-02-10 MED ORDER — SODIUM CHLORIDE 0.9 % IV SOLN
Freq: Once | INTRAVENOUS | Status: AC
  Administered 2018-02-10: 12:00:00 via INTRAVENOUS
  Filled 2018-02-10: qty 250

## 2018-02-10 MED ORDER — ACETAMINOPHEN 325 MG PO TABS
ORAL_TABLET | ORAL | Status: AC
  Filled 2018-02-10: qty 2

## 2018-02-10 MED ORDER — SODIUM CHLORIDE 0.9 % IJ SOLN
10.0000 mL | INTRAMUSCULAR | Status: DC | PRN
  Administered 2018-02-10: 10 mL via INTRAVENOUS
  Filled 2018-02-10: qty 10

## 2018-02-10 MED ORDER — PACLITAXEL PROTEIN-BOUND CHEMO INJECTION 100 MG
80.0000 mg/m2 | Freq: Once | INTRAVENOUS | Status: AC
  Administered 2018-02-10: 175 mg via INTRAVENOUS
  Filled 2018-02-10: qty 35

## 2018-02-10 MED ORDER — DIPHENHYDRAMINE HCL 25 MG PO CAPS
25.0000 mg | ORAL_CAPSULE | Freq: Once | ORAL | Status: AC
  Administered 2018-02-10: 25 mg via ORAL

## 2018-02-10 MED ORDER — HEPARIN SOD (PORK) LOCK FLUSH 100 UNIT/ML IV SOLN
500.0000 [IU] | Freq: Once | INTRAVENOUS | Status: AC | PRN
  Administered 2018-02-10: 500 [IU]
  Filled 2018-02-10: qty 5

## 2018-02-10 MED ORDER — ACETAMINOPHEN 325 MG PO TABS
650.0000 mg | ORAL_TABLET | Freq: Once | ORAL | Status: AC
  Administered 2018-02-10: 650 mg via ORAL

## 2018-02-10 MED ORDER — SODIUM CHLORIDE 0.9% FLUSH
10.0000 mL | INTRAVENOUS | Status: DC | PRN
  Administered 2018-02-10: 10 mL
  Filled 2018-02-10: qty 10

## 2018-02-10 MED ORDER — PROCHLORPERAZINE MALEATE 10 MG PO TABS
ORAL_TABLET | ORAL | Status: AC
  Filled 2018-02-10: qty 1

## 2018-02-10 MED ORDER — DIPHENHYDRAMINE HCL 25 MG PO CAPS
ORAL_CAPSULE | ORAL | Status: AC
  Filled 2018-02-10: qty 1

## 2018-02-10 MED ORDER — PROCHLORPERAZINE MALEATE 10 MG PO TABS
10.0000 mg | ORAL_TABLET | Freq: Once | ORAL | Status: AC
  Administered 2018-02-10: 10 mg via ORAL

## 2018-02-10 MED FILL — Sodium Chloride Inj 0.9%: INTRAMUSCULAR | Qty: 10 | Status: AC

## 2018-02-10 NOTE — Progress Notes (Signed)
[facility]  [phone] [phone]) [phone]   ID: [name] DOB: [birth year]  MR#: [id number]  QIH#:[id number]  Patient Care Team: Vernie Shanks, MD as PCP - General (Family Medicine) Himmelrich, Bryson Ha, RD (Inactive) as Dietitian (Bariatrics) Stark Klein, MD as Consulting Physician (General Surgery) Magrinat, Virgie Dad, MD as Consulting Physician (Oncology) Arloa Koh, MD as Consulting Physician (Radiation Oncology) Mauro Kaufmann, RN as Registered Nurse Rockwell Germany, RN as Registered Nurse Jacelyn Pi, MD as Consulting Physician (Endocrinology) Kem Boroughs, [place] as Nurse Practitioner (Family Medicine) [name], MD as Consulting Physician (Plastic Surgery) Larey Dresser, MD as Consulting Physician (Cardiology) Gery Pray, MD as Consulting Physician (Radiation Oncology) Juanita Craver, MD as Consulting Physician (Gastroenterology) PCP: Vernie Shanks, MD OTHER MD:  CHIEF COMPLAINT:  HER-2 positive, estrogen receptor negative locally recurrent disease  CURRENT TREATMENT:  Trastuzumab; Abraxane  INTERVAL HISTORY:  Darlene [name] returns today for follow-up and treatment of her estrogen receptor negative but HER-2 amplified breast cancer accompanied by her husband. She started Abraxane given on days 1 and 8 of a 21 day cycle last week.  She receives the Kanjinti (Trastuzumab biosimilar) every four weeks.    REVIEW OF SYSTEMS: Darlene [name] is doing well today.  She continues to have increased emotional distress related to the insurance issue. She is improving from this.  She continues to tolerate her treatment well.  She denies any peripheral neuropathy.  She is without any fever or chills.  She hasn't noted any headaches or vision changes.  She denies cough, shortness of breath, chest pain, or palpitations.  She is without bowel/bladder changes, nausea or vomiting.  A detailed ROS was otherwise non contributory today.   BREAST CANCER  HISTORY: From the original intake note:  "Darlene [name]" had screening mammography at her gynecologist suggestive of a possible abnormality in the right breast. However right diagnostic mammogram 04/12/2013 at [facility] was negative. More recently however, she noted a lump in her right breast and brought it to her gynecologist's attention. On 07/18/2014 the patient had bilateral diagnostic mammography with tomosynthesis at [facility]. The breast density was category B. In the right breast upper outer quadrant there was an area of focal asymmetry with indistinct margins.[name] Ultrasound was obtained and showed in addition to the mass in question, measuring 1.7 cm, an abnormal-appearing lymph node in the right axillary tail.  On 07/19/2014 the patient underwent biopsy of the right breast massin question as well as a right axillary lymph node. Both were positive for invasive ductal carcinoma, grade 2, estrogen and progesterone receptor negative, with an MIB-1 of 90%, and HER-2 pending. Incidentally the lymph node biopsy showed a lymphocytic inflammatory component but no lymph node architecture.  Her subsequent history is as detailed below.  PAST MEDICAL HISTORY: Past Medical History:  Diagnosis Date  . Anemia    hx of - during 1st round chemo  . Anxiety   . Arthritis    in fingers, shoulders  . Back pain   . Balance problem   . Breast cancer ([place])   . Breast cancer of upper-outer quadrant of right female breast ([place]) 07/22/2014  . Bunion, right foot   . Clumsiness   . Constipation   . Depression   . Diabetes mellitus without complication ([place])    87+ years  . Eating disorder    binge eating  . Epistaxis 08/26/2014  . Fatty liver   . HCAP (healthcare-associated pneumonia) 12/18/2014  . Headache   .  Heart murmur    never had any problems  . History of radiation therapy 04/05/15-05/19/15   right chest wall was treated to 50.4 Gy in 28 fractions, right mastectomy scar was treated to 10 Gy in 5 fractions  .  Hyperlipidemia   . Hypertension   . Joint pain   . Multiple food allergies   . Neuromuscular disorder ([place])    diabetic neuropathy  . Obesity   . Obstructive sleep apnea on CPAP    uses cpap nightly  . Ovarian cyst rupture    possible  . Pneumonia   . Pneumonia 11/2014  . Radiation 04/05/15-05/19/15   right chest wall 50.4 Gy, mastectomy scar 10 Gy  . Sleep apnea   . Thyroid nodule 06/2014    PAST SURGICAL HISTORY: Past Surgical History:  Procedure Laterality Date  . APPENDECTOMY    . BIOPSY THYROID Bilateral 06/2014   2 nodules - Benign   . BREAST LUMPECTOMY WITH RADIOACTIVE SEED AND SENTINEL LYMPH NODE BIOPSY Right 12/27/2014   Procedure: BREAST LUMPECTOMY WITH RADIOACTIVE SEED AND SENTINEL LYMPH NODE BIOPSY;  Surgeon: Stark Klein, MD;  Location: [facility];  Service: General;  Laterality: Right;  . BREAST REDUCTION SURGERY Bilateral 01/06/2015   Procedure: BILATERAL BREAST REDUCTION, RIGHT ONCOPLASTIC RECONSTRUCTION),LEFT BREAST REDUCTION FOR SYMMETRY;  Surgeon: [name], MD;  Location: [facility];  Service: Plastics;  Laterality: Bilateral;  . BREATH TEK H PYLORI N/A 09/15/2012   Procedure: BREATH TEK H PYLORI;  Surgeon: Pedro Earls, MD;  Location: [facility];  Service: General;  Laterality: N/A;  . BUNIONECTOMY Left 12/2013  . CARPAL TUNNEL RELEASE Right   . CARPAL TUNNEL RELEASE Left   . CATARACT EXTRACTION     6/19  . COLONOSCOPY    . DUPUYTREN CONTRACTURE RELEASE    . GASTRIC ROUX-EN-Y N/A 11/02/2012   Procedure: LAPAROSCOPIC ROUX-EN-Y GASTRIC;  Surgeon: Pedro Earls, MD;  Location: [facility];  Service: General;  Laterality: N/A;  . IR GENERIC HISTORICAL  03/18/2016   IR US GUIDE VASC ACCESS LEFT 03/18/2016 Markus Daft, MD [facility]  . IR GENERIC HISTORICAL  03/18/2016   IR FLUORO GUIDE CV LINE LEFT 03/18/2016 Markus Daft, MD [facility]  . LAPAROSCOPIC APPENDECTOMY N/A 07/22/2015   Procedure: APPENDECTOMY LAPAROSCOPIC;  Surgeon: Michael Boston, MD;   Location: [facility];  Service: General;  Laterality: N/A;  . MASTECTOMY Right 02/15/2015   modified  . MODIFIED MASTECTOMY Right 02/15/2015   Procedure: RIGHT MODIFIED RADICAL MASTECTOMY;  Surgeon: Stark Klein, MD;  Location: [facility];  Service: General;  Laterality: Right;  . PORT-A-CATH REMOVAL Left 10/10/2015   Procedure: PORT REMOVAL;  Surgeon: Stark Klein, MD;  Location: [facility];  Service: General;  Laterality: Left;  . PORTACATH PLACEMENT Left 08/02/2014   Procedure: INSERTION PORT-A-CATH;  Surgeon: Stark Klein, MD;  Location: [place] [facility];  Service: General;  Laterality: Left;  . PORTACATH PLACEMENT N/A 11/05/2016   Procedure: INSERTION PORT-A-CATH;  Surgeon: Stark Klein, MD;  Location: [facility];  Service: General;  Laterality: N/A;  . ROTATOR CUFF REPAIR Right 2005  . SCAR REVISION Right 12/08/2015   Procedure: COMPLEX REPAIR RIGHT CHEST 10-15CM;  Surgeon: [name], MD;  Location: [facility];  Service: Plastics;  Laterality: Right;  . TOE SURGERY  1970s   bone spur  . TRIGGER FINGER RELEASE     x3    FAMILY HISTORY Family History  Problem Relation Age of Onset  . CVA Mother   .  Heart disease Mother   . Sudden death Mother   . Diabetes Father   . Heart failure Father   . Hypertension Father   . Kidney disease Father   . Obesity Father   . Hypertension Sister   . Diabetes Brother   . Breast cancer Paternal Grandmother 40  . Diabetes Paternal Uncle   . Ovarian cancer Neg Hx    The patient's father died from complications of diabetes at age 57. The patient's mother died at age 92 with a subarachnoid hemorrhage. The patient had one brother, one sister. The only breast cancer was the patient's paternal grandmother diagnosed in her 68s. There is no history of ovarian cancer in the family.  GYNECOLOGIC HISTORY:  Patient's last menstrual period was 10/22/2007.  menarche age 77, the patient is GX P0. She went through the change of life in 2011. She  did not take hormone replacement.  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 Her husband [name] is a retired Visual merchandiser (he taught at [employer]).  [name] has 2 children from an earlier marriage, Rockey Situ who teaches in [place], and Azilee Pirro,  who works at the [employer] in in [place]. He has 1 grand son, aged 59. The patient and her husband are not church attenders.   ADVANCED DIRECTIVES:  Not in place   HEALTH MAINTENANCE: Social History   Tobacco Use  . Smoking status: Never Smoker  . Smokeless tobacco: Never Used  Substance Use Topics  . Alcohol use: Yes    Alcohol/week: 1.0 standard drinks    Types: 1 Standard drinks or equivalent per week    Comment: social  . Drug use: No     Colonoscopy:  May 2016  PAP:  Bone density: 07/01/2016 at [facility] showed normal, with a T score of -0.7  Lipid panel:  Allergies  Allergen Reactions  . Nsaids Other (See Comments)    H/o gastric bypass - avoid NSAIDs  . Tape Hives, Rash and Other (See Comments)    Adhesives in bandaids    Current Outpatient Medications  Medication Sig Dispense Refill  . acetaminophen (TYLENOL) 325 MG tablet Take 650 mg by mouth 2 (two) times daily as needed for moderate pain or headache.     [name] buPROPion (WELLBUTRIN SR) 200 MG 12 hr tablet TAKE 1 TABLET BY MOUTH DAILY AT 12 NOON. 30 tablet 0  . CALCIUM CITRATE-VITAMIN D PO Take 1 tablet by mouth 3 (three) times daily.     . cholecalciferol (VITAMIN D) 1000 units tablet Take 1,000 Units by mouth daily.    . Cyanocobalamin (VITAMIN B-12 PO) Take 1 tablet by mouth every 14 (fourteen) days.    . diphenhydrAMINE (BENADRYL) 25 MG tablet Take 25 mg by mouth daily as needed for allergies or sleep.     . Insulin Glargine (LANTUS SOLOSTAR) 100 UNIT/ML Solostar Pen Inject 12 Units into the skin daily. 5 pen PRN  .  Insulin Pen Needle (BD PEN NEEDLE NANO U/F) 32G X 4 MM MISC 1 Package by Does not apply route daily at 12 noon. 100 each 0  . liraglutide (VICTOZA) 18 MG/3ML SOPN INJECT 1.8MG(0.3 MLS) INTO THE SKIN EVERY MORNING 3 pen 0  . loratadine (CLARITIN) 10 MG tablet Take 10 mg by mouth as needed for allergies.    [name] losartan (COZAAR) 100 MG tablet Take 1 tablet (  100 mg total) by mouth daily. 30 tablet 0  . Melatonin 3 MG CAPS Take 3 mg by mouth at bedtime.     . metFORMIN (GLUCOPHAGE) 500 MG tablet Take 1 tablet (500 mg total) by mouth daily. 30 tablet 0  . ONETOUCH DELICA LANCETS 78H MISC 1 Package by Does not apply route daily. 100 each 0  . Pediatric Multivitamins-Iron (FLINTSTONES PLUS IRON) chewable tablet Chew 1 tablet by mouth 2 (two) times daily.    . Probiotic Product (CVS PROBIOTIC PO) Take 1 capsule by mouth daily.     . prochlorperazine (COMPAZINE) 10 MG tablet Take 1 tablet (10 mg total) by mouth every 6 (six) hours as needed for nausea or vomiting. 30 tablet 0  . Propylene Glycol (SYSTANE BALANCE OP) Place 1-2 drops into both eyes 3 (three) times daily as needed (for dry eyes).     [name] senna-docusate (SENNA PLUS) 8.6-50 MG tablet Take 1 tablet by mouth daily as needed for moderate constipation.     . topiramate (TOPAMAX) 25 MG tablet Take 1 tablet (25 mg total) by mouth daily. 30 tablet 0  . vortioxetine HBr (TRINTELLIX) 20 MG TABS Take 20 mg by mouth at bedtime. 30 tablet 0   No current facility-administered medications for this visit.     OBJECTIVE:  Vitals:   02/10/18 1010  BP: (!) 129/55  Pulse: 65  Resp: 18  Temp: 97.8 F (36.6 C)  SpO2: 100%     Body mass index is 29.31 kg/m.    ECOG FS:1 - Symptomatic but completely ambulatory Filed Weights   02/10/18 1010  Weight: 198 lb 8 oz (90 kg)  GENERAL: Patient is a well appearing female in no acute distress HEENT:  Sclerae anicteric.  Oropharynx clear and moist. No ulcerations or evidence of oropharyngeal candidiasis. Neck is  supple.  NODES:  No cervical, supraclavicular, or axillary lymphadenopathy palpated.  BREAST EXAM:  Right mastectomy without any sign of local recurrence, left breast benign LUNGS:  Clear to auscultation bilaterally.  No wheezes or rhonchi. HEART:  Regular rate and rhythm. No murmur appreciated. ABDOMEN:  Soft, nontender.  Positive, normoactive bowel sounds. No organomegaly palpated. MSK:  No focal spinal tenderness to palpation. Full range of motion bilaterally in the upper extremities. EXTREMITIES:  No peripheral edema.   SKIN:  Clear with no obvious rashes or skin changes. No nail dyscrasia. NEURO:  Nonfocal. Well oriented.  Appropriate affect.    LAB RESULTS:  CMP     Component Value Date/Time   NA 142 02/10/2018 0913   NA 143 03/11/2017 0841   NA 140 01/07/2017 0758   K 4.0 02/10/2018 0913   K 4.1 01/07/2017 0758   CL 112 (H) 02/10/2018 0913   CO2 21 (L) 02/10/2018 0913   CO2 24 01/07/2017 0758   GLUCOSE 161 (H) 02/10/2018 0913   GLUCOSE 134 01/07/2017 0758   BUN 28 (H) 02/10/2018 0913   BUN 40 (H) 03/11/2017 0841   BUN 33.6 (H) 01/07/2017 0758   CREATININE 1.50 (H) 02/10/2018 0913   CREATININE 1.74 (H) 12/16/2017 0806   CREATININE 1.5 (H) 01/07/2017 0758   CALCIUM 9.0 02/10/2018 0913   CALCIUM 9.3 01/07/2017 0758   PROT 6.3 (L) 02/10/2018 0913   PROT 6.9 03/11/2017 0841   PROT 6.7 01/07/2017 0758   ALBUMIN 3.6 02/10/2018 0913   ALBUMIN 4.5 03/11/2017 0841   ALBUMIN 3.6 01/07/2017 0758   AST 33 02/10/2018 0913   AST 43 (H) 12/16/2017 8850  AST 51 (H) 01/07/2017 0758   ALT 48 (H) 02/10/2018 0913   ALT 57 (H) 12/16/2017 0806   ALT 82 (H) 01/07/2017 0758   ALKPHOS 154 (H) 02/10/2018 0913   ALKPHOS 115 01/07/2017 0758   BILITOT 0.7 02/10/2018 0913   BILITOT 0.6 12/16/2017 0806   BILITOT 0.72 01/07/2017 0758   GFRNONAA 36 (L) 02/10/2018 0913   GFRNONAA 30 (L) 12/16/2017 0806   GFRNONAA 55 (L) 02/18/2013 0827   GFRAA 42 (L) 02/10/2018 0913   GFRAA 35 (L)  12/16/2017 0806   GFRAA 63 02/18/2013 0827    INo results found for: SPEP, UPEP  Lab Results  Component Value Date   WBC 3.3 (L) 02/10/2018   NEUTROABS 2.1 02/10/2018   HGB 9.2 (L) 02/10/2018   HCT 28.1 (L) 02/10/2018   MCV 93.7 02/10/2018   PLT 129 (L) 02/10/2018      Chemistry      Component Value Date/Time   NA 142 02/10/2018 0913   NA 143 03/11/2017 0841   NA 140 01/07/2017 0758   K 4.0 02/10/2018 0913   K 4.1 01/07/2017 0758   CL 112 (H) 02/10/2018 0913   CO2 21 (L) 02/10/2018 0913   CO2 24 01/07/2017 0758   BUN 28 (H) 02/10/2018 0913   BUN 40 (H) 03/11/2017 0841   BUN 33.6 (H) 01/07/2017 0758   CREATININE 1.50 (H) 02/10/2018 0913   CREATININE 1.74 (H) 12/16/2017 0806   CREATININE 1.5 (H) 01/07/2017 0758      Component Value Date/Time   CALCIUM 9.0 02/10/2018 0913   CALCIUM 9.3 01/07/2017 0758   ALKPHOS 154 (H) 02/10/2018 0913   ALKPHOS 115 01/07/2017 0758   AST 33 02/10/2018 0913   AST 43 (H) 12/16/2017 0806   AST 51 (H) 01/07/2017 0758   ALT 48 (H) 02/10/2018 0913   ALT 57 (H) 12/16/2017 0806   ALT 82 (H) 01/07/2017 0758   BILITOT 0.7 02/10/2018 0913   BILITOT 0.6 12/16/2017 0806   BILITOT 0.72 01/07/2017 0758       No results found for: LABCA2  No components found for: EXBMW413  No results for input(s): INR in the last 168 hours.  Urinalysis    Component Value Date/Time   COLORURINE YELLOW 07/21/2015 [place] 07/21/2015 2328   LABSPEC 1.020 07/21/2015 2328   PHURINE 6.0 07/21/2015 2328   GLUCOSEU NEGATIVE 07/21/2015 2328   HGBUR TRACE (A) 07/21/2015 2328   BILIRUBINUR NEGATIVE 07/21/2015 2328   BILIRUBINUR neg 06/29/2013 1017   KETONESUR NEGATIVE 07/21/2015 2328   PROTEINUR 30 (A) 07/21/2015 2328   UROBILINOGEN negative 06/29/2013 1017   NITRITE NEGATIVE 07/21/2015 2328   LEUKOCYTESUR NEGATIVE 07/21/2015 2328    STUDIES: No results found.   ASSESSMENT: 68 y.o. [place] woman status post right breast upper outer  quadrant and right axillary lymph node biopsy 07/19/2014 for a cT2  pN1, stage IIB  invasive ductal carcinoma, grade 2, estrogen and progesterone receptor negative, with an MIB-1 of 90%, and HER-2 amplified  (1) neoadjuvant chemotherapy started 08/08/2014, consisting of carboplatin, docetaxel, trastuzumab and pertuzumab given every 3 weeks 6, completed 11/21/2014  (a) breast MRI after initial 3 cycles shows a significant response  (b) breast MRI after 6 cycles shows a complete radiologic response   (2) trastuzumab continued through 10/02/2015 (to end of capecitabine therapy)  (a) echo 02/13/2015 shows an EF of 60-65%  (b)  echo 05/09/2015 shows an ejection fraction of 60-65%  (c) echocardiogram 06/27/2015 shows an  ejection fraction of 55-60%.  (d) echocardiogram 01/22/2017 shows an ejection fraction of 55-60%  (3) right lumpectomy with sentinel lymph node biopsy on 12/27/14 showed a residual  ypT2 ypN1a, stage IIB invasive ductal carcinoma, grade 2, with repeat estrogen and progesterone receptors again negative  (4) status post right oncoplastic surgery (with left reduction mammoplasty) 01/06/2015 showing in the right breast multiple foci of intralymphatic carcinoma in random sections  (5)  Right modified radical mastectomy 02/15/2014 showed no residual invasive disease; there was DCIS but margins were negative; all 15 axillary lymph nodes were clear; ypTis ypN0  (a)  The patient has decided against reconstruction.  (6)  Postmastectomy radiation completed 05/18/2015 with capecitabine sensitization  (7) adjuvant capecitabine continued through 09/27/2015  (8) hepatic steatosis with increased fibrosis score (at risk for cirrhosis)  (9) thyroid biopsy (left lobe inferiorly) 2 shows benign tissue 06/28/2015  (10) LOCAL RECURRENCE:  (a) PET scan 12/06/2015 Notes a right retropectoral lymph node measuring 0.9 cm with an SUV max of 5.5  (b) repeat PET scan 03/11/2016 finds the right subpectoral  lymph node to now measure 1.3 cm with an SUV max of 11.8.  (c) PET scan 05/07/2016 shows a significant response to T-DM 1  (d) PET scan 07/10/2017 is clear except for what appears to be a single new liver lesion   (11) started T-DM1 03/21/2016, completed 8 doses 08/13/2016  (a) PET scan 08/19/2016 shows a good response but measurable residual disease  (b) PET scan on 01/16/2017 shows a continuing response  (12) maintenance trastuzumab started 09/03/2016--changed to every 28 days as of 01/28/2017  (a) repeat echocardiogram 07/08/2016 shows an ejection fraction of 60-65%.  (b) echocardiogram on 10/08/2016 demonstrates LVEF of 55-60% (followed by Dr. Haroldine Laws)  (c) echo 01/22/2017 shows an ejection fraction in the 55-60% range  (d) echo 04/24/2017 shows an ejection fraction in the 55-60% range (recommendation for echo every 6 months by Dr. Aundra Dubin)  (e) trastuzumab changed to T-DM 1 after trastuzumab dose 07/15/2017  METASTATIC DISEASE: July 2019 (13)  Radiation to the area of persistent disease in the right upper chest completed 10/30/2016   (14) T-DM 1 resumed 08/12/2017, repeated every 21 days  (a) baseline liver MRI 08/02/2017 shows a 1.4 cm mass, "hot" on PET 07/10/2017  (b) repeat liver MRI 10/22/2018 3:19 cycles of T-DM 1, shows slight enlargement  (c) T-DM 1 discontinued after 09/23/2017 dose  (15) trastuzumab resumed 11/18/2017, to be repeated every 4 weeks  (16) liver biopsy 11/03/2017 shows adenocarcinoma, most consistent with an upper gastrointestinal primary; this tumor was estrogen receptor and HER-2 negative  (a) GI workup for occult primary ([place]) negative  (b) PET 12/09/2017 shows no obvious primary.  There is a right subpectoral lymph node noted as well  (c) CA 27-29 and CA 19-9 uninformative; CEA pending   (17) to start Abraxane on 12/10, given days 1 and 8 on a 21 day cycle x 3, then restaging with MRI liver 02/24/2018 tentatively  (18) consider Yttrium or other local  treatment to liver per IR  PLAN:  Darlene [name] is doing well today.  She continues to tolerate her treatment well and has no signs of peripheral neuropathy.  Her labs are stable, and the abraxane was previously dose reduced to to mild thrombocytopenia from 155m/m2 to 80 mg/m2.  Her platelets are above 100 today.  She will proceed with her treatment with Abraxane and the Kanjinti (Trastuzumab biosimilar).    LJeani Hawkingwill return in one week for labs, evaluation and  her day 8 abraxane.  She has a MRI liver scheduled on 02/24/2018 and f/u with Dr. Jana Hakim to review her results on 03/03/2018.  She knows to call for any questions or concerns prior to her next appointment with [name].     A total of (30) minutes of face-to-face time was spent with this patient with greater than 50% of that time in counseling and care-coordination.   Wilber Bihari, NP  02/10/18 10:38 AM Medical Oncology and Hematology [facility] [address] [place], [place] [address] Tel. [phone]    Fax. [phone]

## 2018-02-10 NOTE — Patient Instructions (Signed)
[facility] Discharge Instructions for Patients Receiving Chemotherapy  Today you received the following chemotherapy agents Kanjinti and Abraxane  To help prevent nausea and vomiting after your treatment, we encourage you to take your nausea medication as directed.   If you develop nausea and vomiting that is not controlled by your nausea medication, call the clinic.   BELOW ARE SYMPTOMS THAT SHOULD BE REPORTED IMMEDIATELY:  *FEVER GREATER THAN 100.5 F  *CHILLS WITH OR WITHOUT FEVER  NAUSEA AND VOMITING THAT IS NOT CONTROLLED WITH YOUR NAUSEA MEDICATION  *UNUSUAL SHORTNESS OF BREATH  *UNUSUAL BRUISING OR BLEEDING  TENDERNESS IN MOUTH AND THROAT WITH OR WITHOUT PRESENCE OF ULCERS  *URINARY PROBLEMS  *BOWEL PROBLEMS  UNUSUAL RASH Items with * indicate a potential emergency and should be followed up as soon as possible.  Feel free to call the clinic you have any questions or concerns. The clinic phone number is [phone].  Please show the [facility] at check-in to the Emergency Department and triage nurse.

## 2018-02-11 ENCOUNTER — Encounter: Payer: Self-pay | Admitting: Adult Health

## 2018-02-17 ENCOUNTER — Encounter: Payer: Self-pay | Admitting: Adult Health

## 2018-02-17 ENCOUNTER — Ambulatory Visit: Payer: Medicare Other

## 2018-02-17 ENCOUNTER — Inpatient Hospital Stay: Payer: Medicare Other

## 2018-02-17 VITALS — BP 149/64 | HR 59 | Temp 97.8°F | Resp 18

## 2018-02-17 DIAGNOSIS — Z171 Estrogen receptor negative status [ER-]: Secondary | ICD-10-CM

## 2018-02-17 DIAGNOSIS — C801 Malignant (primary) neoplasm, unspecified: Secondary | ICD-10-CM

## 2018-02-17 DIAGNOSIS — C50911 Malignant neoplasm of unspecified site of right female breast: Secondary | ICD-10-CM

## 2018-02-17 DIAGNOSIS — Z5112 Encounter for antineoplastic immunotherapy: Secondary | ICD-10-CM | POA: Diagnosis not present

## 2018-02-17 DIAGNOSIS — C787 Secondary malignant neoplasm of liver and intrahepatic bile duct: Secondary | ICD-10-CM

## 2018-02-17 DIAGNOSIS — C50411 Malignant neoplasm of upper-outer quadrant of right female breast: Secondary | ICD-10-CM

## 2018-02-17 DIAGNOSIS — Z95828 Presence of other vascular implants and grafts: Secondary | ICD-10-CM

## 2018-02-17 LAB — COMPREHENSIVE METABOLIC PANEL
ALT: 62 U/L — ABNORMAL HIGH (ref 0–44)
AST: 46 U/L — ABNORMAL HIGH (ref 15–41)
Albumin: 3.7 g/dL (ref 3.5–5.0)
Alkaline Phosphatase: 151 U/L — ABNORMAL HIGH (ref 38–126)
Anion gap: 7 (ref 5–15)
BUN: 30 mg/dL — ABNORMAL HIGH (ref 8–23)
CO2: 23 mmol/L (ref 22–32)
Calcium: 9 mg/dL (ref 8.9–10.3)
Chloride: 112 mmol/L — ABNORMAL HIGH (ref 98–111)
Creatinine, Ser: 1.48 mg/dL — ABNORMAL HIGH (ref 0.44–1.00)
GFR calc Af Amer: 43 mL/min — ABNORMAL LOW (ref >60)
GFR calc non Af Amer: 37 mL/min — ABNORMAL LOW (ref >60)
Glucose, Bld: 134 mg/dL — ABNORMAL HIGH (ref 70–99)
Potassium: 4.2 mmol/L (ref 3.5–5.1)
Sodium: 142 mmol/L (ref 135–145)
Total Bilirubin: 0.7 mg/dL (ref 0.3–1.2)
Total Protein: 6.5 g/dL (ref 6.5–8.1)

## 2018-02-17 LAB — CBC WITH DIFFERENTIAL/PLATELET
Abs Immature Granulocytes: 0.03 10*3/uL (ref 0.00–0.07)
Basophils Absolute: 0 10*3/uL (ref 0.0–0.1)
Basophils Relative: 1 %
Eosinophils Absolute: 0.2 10*3/uL (ref 0.0–0.5)
Eosinophils Relative: 5 %
HCT: 27.3 % — ABNORMAL LOW (ref 36.0–46.0)
Hemoglobin: 9.1 g/dL — ABNORMAL LOW (ref 12.0–15.0)
Immature Granulocytes: 1 %
Lymphocytes Relative: 24 %
Lymphs Abs: 0.8 10*3/uL (ref 0.7–4.0)
MCH: 31.4 pg (ref 26.0–34.0)
MCHC: 33.3 g/dL (ref 30.0–36.0)
MCV: 94.1 fL (ref 80.0–100.0)
Monocytes Absolute: 0.2 10*3/uL (ref 0.1–1.0)
Monocytes Relative: 5 %
Neutro Abs: 2.2 10*3/uL (ref 1.7–7.7)
Neutrophils Relative %: 64 %
Platelets: 132 10*3/uL — ABNORMAL LOW (ref 150–400)
RBC: 2.9 MIL/uL — ABNORMAL LOW (ref 3.87–5.11)
RDW: 15.5 % (ref 11.5–15.5)
WBC: 3.4 10*3/uL — ABNORMAL LOW (ref 4.0–10.5)
nRBC: 0 % (ref 0.0–0.2)

## 2018-02-17 MED ORDER — PROCHLORPERAZINE MALEATE 10 MG PO TABS
10.0000 mg | ORAL_TABLET | Freq: Once | ORAL | Status: AC
  Administered 2018-02-17: 10 mg via ORAL

## 2018-02-17 MED ORDER — PROCHLORPERAZINE MALEATE 10 MG PO TABS
ORAL_TABLET | ORAL | Status: AC
  Filled 2018-02-17: qty 1

## 2018-02-17 MED ORDER — SODIUM CHLORIDE 0.9 % IV SOLN
Freq: Once | INTRAVENOUS | Status: AC
  Administered 2018-02-17: 10:00:00 via INTRAVENOUS
  Filled 2018-02-17: qty 250

## 2018-02-17 MED ORDER — HEPARIN SOD (PORK) LOCK FLUSH 100 UNIT/ML IV SOLN
500.0000 [IU] | Freq: Once | INTRAVENOUS | Status: AC | PRN
  Administered 2018-02-17: 500 [IU]
  Filled 2018-02-17: qty 5

## 2018-02-17 MED ORDER — PACLITAXEL PROTEIN-BOUND CHEMO INJECTION 100 MG: 80.0000 mg/m2 | Freq: Once | Status: DC

## 2018-02-17 MED ORDER — SODIUM CHLORIDE 0.9% FLUSH
10.0000 mL | INTRAVENOUS | Status: DC | PRN
  Administered 2018-02-17: 10 mL
  Filled 2018-02-17: qty 10

## 2018-02-17 MED ORDER — PACLITAXEL PROTEIN-BOUND CHEMO INJECTION 100 MG
80.0000 mg/m2 | Freq: Once | INTRAVENOUS | Status: AC
  Administered 2018-02-17: 175 mg via INTRAVENOUS
  Filled 2018-02-17: qty 35

## 2018-02-17 NOTE — Patient Instructions (Signed)
[facility] Discharge Instructions for Patients Receiving Chemotherapy  Today you received the following chemotherapy agents Abraxane.  To help prevent nausea and vomiting after your treatment, we encourage you to take your nausea medication as instructed.  If you develop nausea and vomiting that is not controlled by your nausea medication, call the clinic.   BELOW ARE SYMPTOMS THAT SHOULD BE REPORTED IMMEDIATELY:  *FEVER GREATER THAN 100.5 F  *CHILLS WITH OR WITHOUT FEVER  NAUSEA AND VOMITING THAT IS NOT CONTROLLED WITH YOUR NAUSEA MEDICATION  *UNUSUAL SHORTNESS OF BREATH  *UNUSUAL BRUISING OR BLEEDING  TENDERNESS IN MOUTH AND THROAT WITH OR WITHOUT PRESENCE OF ULCERS  *URINARY PROBLEMS  *BOWEL PROBLEMS  UNUSUAL RASH Items with * indicate a potential emergency and should be followed up as soon as possible.  Feel free to call the clinic should you have any questions or concerns. The clinic phone number is [phone].  Please show the [facility] at check-in to the Emergency Department and triage nurse.

## 2018-02-23 ENCOUNTER — Encounter (INDEPENDENT_AMBULATORY_CARE_PROVIDER_SITE_OTHER): Payer: Self-pay | Admitting: Family Medicine

## 2018-02-24 ENCOUNTER — Ambulatory Visit (HOSPITAL_COMMUNITY)
Admission: RE | Admit: 2018-02-24 | Discharge: 2018-02-24 | Disposition: A | Discharge status: Home / Self Care | Payer: Medicare Other | Source: Ambulatory Visit | Attending: Adult Health | Admitting: Adult Health

## 2018-02-24 DIAGNOSIS — Z171 Estrogen receptor negative status [ER-]: Secondary | ICD-10-CM | POA: Insufficient documentation

## 2018-02-24 DIAGNOSIS — C50411 Malignant neoplasm of upper-outer quadrant of right female breast: Secondary | ICD-10-CM | POA: Diagnosis not present

## 2018-02-24 MED ORDER — HEPARIN SOD (PORK) LOCK FLUSH 100 UNIT/ML IV SOLN
INTRAVENOUS | Status: AC
  Filled 2018-02-24: qty 5

## 2018-02-24 MED ORDER — GADOBUTROL 1 MMOL/ML IV SOLN
9.0000 mL | Freq: Once | INTRAVENOUS | Status: AC | PRN
  Administered 2018-02-24: 9 mL via INTRAVENOUS

## 2018-02-24 MED ORDER — HEPARIN SOD (PORK) LOCK FLUSH 100 UNIT/ML IV SOLN: 500.0000 [IU] | Freq: Once | INTRAVENOUS | Status: DC

## 2018-02-25 ENCOUNTER — Encounter (INDEPENDENT_AMBULATORY_CARE_PROVIDER_SITE_OTHER): Payer: Self-pay | Admitting: Family Medicine

## 2018-02-25 ENCOUNTER — Ambulatory Visit (INDEPENDENT_AMBULATORY_CARE_PROVIDER_SITE_OTHER): Payer: Medicare Other | Admitting: Family Medicine

## 2018-02-25 VITALS — BP 130/73 | HR 65 | Temp 97.8°F | Ht 69.0 in | Wt 195.0 lb

## 2018-02-25 DIAGNOSIS — F418 Other specified anxiety disorders: Secondary | ICD-10-CM

## 2018-02-25 DIAGNOSIS — E1169 Type 2 diabetes mellitus with other specified complication: Secondary | ICD-10-CM

## 2018-02-25 DIAGNOSIS — F3289 Other specified depressive episodes: Secondary | ICD-10-CM

## 2018-02-25 DIAGNOSIS — I10 Essential (primary) hypertension: Secondary | ICD-10-CM

## 2018-02-25 DIAGNOSIS — Z6830 Body mass index (BMI) 30.0-30.9, adult: Secondary | ICD-10-CM

## 2018-02-25 DIAGNOSIS — Z794 Long term (current) use of insulin: Secondary | ICD-10-CM

## 2018-02-25 DIAGNOSIS — E119 Type 2 diabetes mellitus without complications: Secondary | ICD-10-CM

## 2018-02-25 DIAGNOSIS — E669 Obesity, unspecified: Secondary | ICD-10-CM

## 2018-02-25 MED ORDER — INSULIN PEN NEEDLE 32G X 4 MM MISC: 1.0000 | Freq: Every day | 0 refills | Status: DC

## 2018-02-25 MED ORDER — LOSARTAN POTASSIUM 100 MG PO TABS: 100.0000 mg | ORAL_TABLET | Freq: Every day | ORAL | 0 refills | Status: DC

## 2018-02-25 MED ORDER — TOPIRAMATE 25 MG PO TABS: 25.0000 mg | ORAL_TABLET | Freq: Every day | ORAL | 0 refills | Status: DC

## 2018-02-25 MED ORDER — BUPROPION HCL ER (SR) 200 MG PO TB12: ORAL_TABLET | ORAL | 0 refills | Status: DC

## 2018-02-25 MED ORDER — LIRAGLUTIDE 18 MG/3ML ~~LOC~~ SOPN: PEN_INJECTOR | SUBCUTANEOUS | 0 refills | Status: DC

## 2018-02-25 MED ORDER — METFORMIN HCL 500 MG PO TABS: 500.0000 mg | ORAL_TABLET | Freq: Every day | ORAL | 0 refills | Status: DC

## 2018-02-25 MED ORDER — VORTIOXETINE HBR 20 MG PO TABS: 20.0000 mg | ORAL_TABLET | Freq: Every day | ORAL | 0 refills | Status: DC

## 2018-02-25 MED ORDER — INSULIN GLARGINE 100 UNIT/ML SOLOSTAR PEN: 12.0000 [IU] | PEN_INJECTOR | Freq: Every day | SUBCUTANEOUS | 0 refills | Status: DC

## 2018-02-27 NOTE — Progress Notes (Signed)
[place]  [phone] Fax:(336) [phone]   ID: Darlene [name] DOB: 05-Sep-1950  MR#: [id number]  CBJ#:[id number]  Patient Care Team: Vernie Shanks, MD as PCP - General (Family Medicine) Himmelrich, Bryson Ha, RD (Inactive) as Dietitian (Bariatrics) Stark Klein, MD as Consulting Physician (General Surgery) Magrinat, Virgie Dad, MD as Consulting Physician (Oncology) Arloa Koh, MD as Consulting Physician (Radiation Oncology) Mauro Kaufmann, RN as Registered Nurse Rockwell Germany, RN as Registered Nurse Jacelyn Pi, MD as Consulting Physician (Endocrinology) Kem Boroughs, [place] as Nurse Practitioner (Family Medicine) [name], MD as Consulting Physician (Plastic Surgery) Larey Dresser, MD as Consulting Physician (Cardiology) Gery Pray, MD as Consulting Physician (Radiation Oncology) Juanita Craver, MD as Consulting Physician (Gastroenterology)  OTHER MD:   CHIEF COMPLAINT:  HER-2 positive, estrogen receptor negative locally recurrent disease  CURRENT TREATMENT:  Trastuzumab; Abraxane   INTERVAL HISTORY:  Darlene [name] returns today for follow-up and treatment of her estrogen receptor negative but HER-2 amplified breast cancer. She is accompanied by her husband.  She continues on trastuzumab.  She tolerates this with no side effects that she is aware of.  She is a bit behind as far as her echocardiogram is concerned, the last one being in October  She also continues on abraxane.  She has mild fatigue and mild nausea with this, but no worsening problems with neuropathy  Darlene [name]'s last echocardiogram on 10/23/2017, showed an ejection fraction in the 55% - 60% range.   Since her last visit here, she underwent a liver MRI with and without contrast on 02/24/2018 showing Interval response to therapy. The right lobe of liver lesion has decreased in size in the interval. Mild porta hepatic adenopathy is stable to slightly improved in the  interval. Left adrenal gland adenoma.  Darlene [name] requests the Prevnar pneumonia vaccine; orders have been placed for her to receive this in the treatment area today.    REVIEW OF SYSTEMS: Darlene [name] has noted that she has trigger finger, which she has had in the past, and she would like to know if it was ok for her to get a Cortizone shot. Denied by [employer] for her compazine when it was administered on site, so she will start taking it home before treatment. She says that she has felt like she has something missing in her life, so she wants to get out and do more things; she has plans to learn to crochet to make NICU blankets. She recently stopped water aerobics, but she wants to get back into it. The patient denies unusual headaches, visual changes, nausea, vomiting, or dizziness. There has been no unusual cough, phlegm production, or pleurisy. This been no change in bowel or bladder habits. The patient denies unexplained fatigue or unexplained weight loss, bleeding, rash, or fever. A detailed review of systems was otherwise noncontributory.    BREAST CANCER HISTORY: From the original intake note:  "Darlene [name]" had screening mammography at her gynecologist suggestive of a possible abnormality in the right breast. However right diagnostic mammogram 04/12/2013 at [facility] was negative. More recently however, she noted a lump in her right breast and brought it to her gynecologist's attention. On 07/18/2014 the patient had bilateral diagnostic mammography with tomosynthesis at [facility]. The breast density was category B. In the right breast upper outer quadrant there was an area of focal asymmetry with indistinct margins.[name] Ultrasound was obtained and showed in addition to the mass in question, measuring 1.7 cm, an abnormal-appearing lymph node in the right  axillary tail.  On 07/19/2014 the patient underwent biopsy of the right breast massin question as well as a right axillary lymph node. Both were positive for  invasive ductal carcinoma, grade 2, estrogen and progesterone receptor negative, with an MIB-1 of 90%, and HER-2 pending. Incidentally the lymph node biopsy showed a lymphocytic inflammatory component but no lymph node architecture.  Her subsequent history is as detailed below.   PAST MEDICAL HISTORY: Past Medical History:  Diagnosis Date  . Anemia    hx of - during 1st round chemo  . Anxiety   . Arthritis    in fingers, shoulders  . Back pain   . Balance problem   . Breast cancer ([place])   . Breast cancer of upper-outer quadrant of right female breast ([place]) 07/22/2014  . Bunion, right foot   . Clumsiness   . Constipation   . Depression   . Diabetes mellitus without complication ([place])    62+ years  . Eating disorder    binge eating  . Epistaxis 08/26/2014  . Fatty liver   . HCAP (healthcare-associated pneumonia) 12/18/2014  . Headache   . Heart murmur    never had any problems  . History of radiation therapy 04/05/15-05/19/15   right chest wall was treated to 50.4 Gy in 28 fractions, right mastectomy scar was treated to 10 Gy in 5 fractions  . Hyperlipidemia   . Hypertension   . Joint pain   . Multiple food allergies   . Neuromuscular disorder ([place])    diabetic neuropathy  . Obesity   . Obstructive sleep apnea on CPAP    uses cpap nightly  . Ovarian cyst rupture    possible  . Pneumonia   . Pneumonia 11/2014  . Radiation 04/05/15-05/19/15   right chest wall 50.4 Gy, mastectomy scar 10 Gy  . Sleep apnea   . Thyroid nodule 06/2014    PAST SURGICAL HISTORY: Past Surgical History:  Procedure Laterality Date  . APPENDECTOMY    . BIOPSY THYROID Bilateral 06/2014   2 nodules - Benign   . BREAST LUMPECTOMY WITH RADIOACTIVE SEED AND SENTINEL LYMPH NODE BIOPSY Right 12/27/2014   Procedure: BREAST LUMPECTOMY WITH RADIOACTIVE SEED AND SENTINEL LYMPH NODE BIOPSY;  Surgeon: Stark Klein, MD;  Location: [place];  Service: General;  Laterality: Right;  . BREAST  REDUCTION SURGERY Bilateral 01/06/2015   Procedure: BILATERAL BREAST REDUCTION, RIGHT ONCOPLASTIC RECONSTRUCTION),LEFT BREAST REDUCTION FOR SYMMETRY;  Surgeon: [name], MD;  Location: [place];  Service: Plastics;  Laterality: Bilateral;  . BREATH TEK H PYLORI N/A 09/15/2012   Procedure: BREATH TEK H PYLORI;  Surgeon: Pedro Earls, MD;  Location: [facility];  Service: General;  Laterality: N/A;  . BUNIONECTOMY Left 12/2013  . CARPAL TUNNEL RELEASE Right   . CARPAL TUNNEL RELEASE Left   . CATARACT EXTRACTION     6/19  . COLONOSCOPY    . DUPUYTREN CONTRACTURE RELEASE    . GASTRIC ROUX-EN-Y N/A 11/02/2012   Procedure: LAPAROSCOPIC ROUX-EN-Y GASTRIC;  Surgeon: Pedro Earls, MD;  Location: [facility];  Service: General;  Laterality: N/A;  . IR GENERIC HISTORICAL  03/18/2016   IR US GUIDE VASC ACCESS LEFT 03/18/2016 Markus Daft, MD [facility]  . IR GENERIC HISTORICAL  03/18/2016   IR FLUORO GUIDE CV LINE LEFT 03/18/2016 Markus Daft, MD [facility]  . LAPAROSCOPIC APPENDECTOMY N/A 07/22/2015   Procedure: APPENDECTOMY LAPAROSCOPIC;  Surgeon: Michael Boston, MD;  [facility];  Service: General;  Laterality:  N/A;  . MASTECTOMY Right 02/15/2015   modified  . MODIFIED MASTECTOMY Right 02/15/2015   Procedure: RIGHT MODIFIED RADICAL MASTECTOMY;  Surgeon: Stark Klein, MD;  Location: [place];  Service: General;  Laterality: Right;  . PORT-A-CATH REMOVAL Left 10/10/2015   Procedure: PORT REMOVAL;  Surgeon: Stark Klein, MD;  Location: [place];  Service: General;  Laterality: Left;  . PORTACATH PLACEMENT Left 08/02/2014   Procedure: INSERTION PORT-A-CATH;  Surgeon: Stark Klein, MD;  Location: [place];  Service: General;  Laterality: Left;  . PORTACATH PLACEMENT N/A 11/05/2016   Procedure: INSERTION PORT-A-CATH;  Surgeon: Stark Klein, MD;  Location: [place];  Service: General;  Laterality: N/A;  . ROTATOR CUFF REPAIR Right 2005  . SCAR REVISION Right 12/08/2015   Procedure: COMPLEX  REPAIR RIGHT CHEST 10-15CM;  Surgeon: [name], MD;  Location: [facility];  Service: Plastics;  Laterality: Right;  . TOE SURGERY  1970s   bone spur  . TRIGGER FINGER RELEASE     x3    FAMILY HISTORY Family History  Problem Relation Age of Onset  . CVA Mother   . Heart disease Mother   . Sudden death Mother   . Diabetes Father   . Heart failure Father   . Hypertension Father   . Kidney disease Father   . Obesity Father   . Hypertension Sister   . Diabetes Brother   . Breast cancer Paternal Grandmother 73  . Diabetes Paternal Uncle   . Ovarian cancer Neg Hx    The patient's father died from complications of diabetes at age 42. The patient's mother died at age 63 with a subarachnoid hemorrhage. The patient had one brother, one sister. The only breast cancer was the patient's paternal grandmother diagnosed in her 18s. There is no history of ovarian cancer in the family.   GYNECOLOGIC HISTORY:  Patient's last menstrual period was 10/22/2007.  menarche age 8, the patient is GX P0. She went through the change of life in 2011. She did not take hormone replacement.   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 Her husband [name] is a retired Visual merchandiser (he taught at [employer]).  [name] has 2 children from an earlier marriage, Rockey Situ who teaches in [place], and Etoile Looman,  who works at the [employer] in in [place]. He has 1 grand son, aged 14. The patient and her husband are not church attenders.   ADVANCED DIRECTIVES:  Not in place   HEALTH MAINTENANCE: Social History   Tobacco Use  . Smoking status: Never Smoker  . Smokeless tobacco: Never Used  Substance Use Topics  . Alcohol use: Yes    Alcohol/week: 1.0 standard drinks    Types: 1 Standard drinks or equivalent per week    Comment: social  .  Drug use: No     Colonoscopy:  May 2016  PAP:  Bone density: 07/01/2016 at [facility] showed normal, with a T score of -0.7  Lipid panel:  Allergies  Allergen Reactions  . Nsaids Other (See Comments)    H/o gastric bypass - avoid NSAIDs  . Tape Hives, Rash and Other (See Comments)    Adhesives in bandaids    Current Outpatient Medications  Medication Sig Dispense Refill  . acetaminophen (TYLENOL) 325 MG tablet Take 650 mg by mouth 2 (two) times daily as needed for  moderate pain or headache.     [name] buPROPion (WELLBUTRIN SR) 200 MG 12 hr tablet TAKE 1 TABLET BY MOUTH DAILY AT 12 NOON. 30 tablet 0  . CALCIUM CITRATE-VITAMIN D PO Take 1 tablet by mouth 3 (three) times daily.     . cholecalciferol (VITAMIN D) 1000 units tablet Take 1,000 Units by mouth daily.    . Cyanocobalamin (VITAMIN B-12 PO) Take 1 tablet by mouth every 14 (fourteen) days.    . diphenhydrAMINE (BENADRYL) 25 MG tablet Take 25 mg by mouth daily as needed for allergies or sleep.     . Insulin Glargine (LANTUS SOLOSTAR) 100 UNIT/ML Solostar Pen Inject 12 Units into the skin daily. 5 pen 0  . Insulin Pen Needle (BD PEN NEEDLE NANO U/F) 32G X 4 MM MISC 1 Package by Does not apply route daily at 12 noon. 100 each 0  . liraglutide (VICTOZA) 18 MG/3ML SOPN INJECT 1.'8MG'$ (0.3 MLS) INTO THE SKIN EVERY MORNING 3 pen 0  . loratadine (CLARITIN) 10 MG tablet Take 10 mg by mouth as needed for allergies.    [name] losartan (COZAAR) 100 MG tablet Take 1 tablet (100 mg total) by mouth daily. 30 tablet 0  . Melatonin 3 MG CAPS Take 3 mg by mouth at bedtime.     . metFORMIN (GLUCOPHAGE) 500 MG tablet Take 1 tablet (500 mg total) by mouth daily. 30 tablet 0  . ONETOUCH DELICA LANCETS 52C MISC 1 Package by Does not apply route daily. 100 each 0  . Pediatric Multivitamins-Iron (FLINTSTONES PLUS IRON) chewable tablet Chew 1 tablet by mouth 2 (two) times daily.    . Probiotic Product (CVS PROBIOTIC PO) Take 1 capsule by mouth daily.     .  prochlorperazine (COMPAZINE) 10 MG tablet Take 1 tablet (10 mg total) by mouth every 6 (six) hours as needed for nausea or vomiting. 30 tablet 0  . Propylene Glycol (SYSTANE BALANCE OP) Place 1-2 drops into both eyes 3 (three) times daily as needed (for dry eyes).     [name] senna-docusate (SENNA PLUS) 8.6-50 MG tablet Take 1 tablet by mouth daily as needed for moderate constipation.     . topiramate (TOPAMAX) 25 MG tablet Take 1 tablet (25 mg total) by mouth daily. 30 tablet 0  . vortioxetine HBr (TRINTELLIX) 20 MG TABS tablet Take 1 tablet (20 mg total) by mouth at bedtime. 30 tablet 0   Current Facility-Administered Medications  Medication Dose Route Frequency Provider Last Rate Last Dose  . pneumococcal 13-valent conjugate vaccine (PREVNAR 13) injection 0.5 mL  0.5 mL Intramuscular Once Magrinat, Virgie Dad, MD        OBJECTIVE: Latest white woman who appears stated age  Vitals:   03/03/18 0958  BP: (!) 138/50  Pulse: 75  Resp: 18  Temp: 97.8 F (36.6 C)  SpO2: 99%     Body mass index is 29.55 kg/m.    ECOG FS:1 - Symptomatic but completely ambulatory Filed Weights   03/03/18 0958  Weight: 200 lb 1.6 oz (90.8 kg)   Sclerae unicteric, EOMs intact No cervical or supraclavicular adenopathy Lungs no rales or rhonchi Heart regular rate and rhythm Abd soft, nontender, positive bowel sounds MSK no focal spinal tenderness, no upper extremity lymphedema Neuro: nonfocal, well oriented, appropriate affect Breasts: Right breast is status post mastectomy.  There is no evidence of local recurrence.  The left breast is benign.     LAB RESULTS:  CMP     Component Value Date/Time  NA 143 03/03/2018 0931   NA 143 03/11/2017 0841   NA 140 01/07/2017 0758   K 4.4 03/03/2018 0931   K 4.1 01/07/2017 0758   CL 114 (H) 03/03/2018 0931   CO2 22 03/03/2018 0931   CO2 24 01/07/2017 0758   GLUCOSE 156 (H) 03/03/2018 0931   GLUCOSE 134 01/07/2017 0758   BUN 26 (H) 03/03/2018 0931   BUN 40 (H)  03/11/2017 0841   BUN 33.6 (H) 01/07/2017 0758   CREATININE 1.59 (H) 03/03/2018 0931   CREATININE 1.74 (H) 12/16/2017 0806   CREATININE 1.5 (H) 01/07/2017 0758   CALCIUM 8.8 (L) 03/03/2018 0931   CALCIUM 9.3 01/07/2017 0758   PROT 6.3 (L) 03/03/2018 0931   PROT 6.9 03/11/2017 0841   PROT 6.7 01/07/2017 0758   ALBUMIN 3.7 03/03/2018 0931   ALBUMIN 4.5 03/11/2017 0841   ALBUMIN 3.6 01/07/2017 0758   AST 59 (H) 03/03/2018 0931   AST 43 (H) 12/16/2017 0806   AST 51 (H) 01/07/2017 0758   ALT 80 (H) 03/03/2018 0931   ALT 57 (H) 12/16/2017 0806   ALT 82 (H) 01/07/2017 0758   ALKPHOS 146 (H) 03/03/2018 0931   ALKPHOS 115 01/07/2017 0758   BILITOT 0.6 03/03/2018 0931   BILITOT 0.6 12/16/2017 0806   BILITOT 0.72 01/07/2017 0758   GFRNONAA 34 (L) 03/03/2018 0931   GFRNONAA 30 (L) 12/16/2017 0806   GFRNONAA 55 (L) 02/18/2013 0827   GFRAA 39 (L) 03/03/2018 0931   GFRAA 35 (L) 12/16/2017 0806   GFRAA 63 02/18/2013 0827    INo results found for: SPEP, UPEP  Lab Results  Component Value Date   WBC 2.9 (L) 03/03/2018   NEUTROABS 1.8 03/03/2018   HGB 9.3 (L) 03/03/2018   HCT 27.9 (L) 03/03/2018   MCV 95.2 03/03/2018   PLT 116 (L) 03/03/2018      Chemistry      Component Value Date/Time   NA 143 03/03/2018 0931   NA 143 03/11/2017 0841   NA 140 01/07/2017 0758   K 4.4 03/03/2018 0931   K 4.1 01/07/2017 0758   CL 114 (H) 03/03/2018 0931   CO2 22 03/03/2018 0931   CO2 24 01/07/2017 0758   BUN 26 (H) 03/03/2018 0931   BUN 40 (H) 03/11/2017 0841   BUN 33.6 (H) 01/07/2017 0758   CREATININE 1.59 (H) 03/03/2018 0931   CREATININE 1.74 (H) 12/16/2017 0806   CREATININE 1.5 (H) 01/07/2017 0758      Component Value Date/Time   CALCIUM 8.8 (L) 03/03/2018 0931   CALCIUM 9.3 01/07/2017 0758   ALKPHOS 146 (H) 03/03/2018 0931   ALKPHOS 115 01/07/2017 0758   AST 59 (H) 03/03/2018 0931   AST 43 (H) 12/16/2017 0806   AST 51 (H) 01/07/2017 0758   ALT 80 (H) 03/03/2018 0931   ALT 57 (H)  12/16/2017 0806   ALT 82 (H) 01/07/2017 0758   BILITOT 0.6 03/03/2018 0931   BILITOT 0.6 12/16/2017 0806   BILITOT 0.72 01/07/2017 0758       No results found for: LABCA2  No components found for: GNFAO130  No results for input(s): INR in the last 168 hours.  Urinalysis    Component Value Date/Time   COLORURINE YELLOW 07/21/2015 2328   APPEARANCEUR CLEAR 07/21/2015 2328   LABSPEC 1.020 07/21/2015 2328   PHURINE 6.0 07/21/2015 2328   GLUCOSEU NEGATIVE 07/21/2015 2328   HGBUR TRACE (A) 07/21/2015 [place] 07/21/2015 2328   BILIRUBINUR neg  06/29/2013 [place] 07/21/2015 2328   PROTEINUR 30 (A) 07/21/2015 2328   UROBILINOGEN negative 06/29/2013 1017   NITRITE NEGATIVE 07/21/2015 2328   LEUKOCYTESUR NEGATIVE 07/21/2015 2328    STUDIES: Mr Liver W Wo Contrast  Result Date: 02/24/2018 CLINICAL DATA:  Metastatic breast cancer.  Evaluate liver lesions. EXAM: MRI ABDOMEN WITHOUT AND WITH CONTRAST TECHNIQUE: Multiplanar multisequence MR imaging of the abdomen was performed both before and after the administration of intravenous contrast. CONTRAST:  9 cc Gadavist COMPARISON:  PET-CT 12/09/2017 and MRI 11/18/2017. FINDINGS: Lower chest: No acute findings. Hepatobiliary: There has been considerable reduction in size right lobe of liver lesion which is technically difficult to visualize on today's study. This is best seen on the T2 fat saturated sequence as an area of mild increased signal measuring 1.2 x 0.9 cm, image 10/3. Previously this measured 2.2 by 1.6 cm. No new liver lesions identified at this time. Small stones layering within the gallbladder are noted. No gallbladder wall thickening or biliary ductal dilatation. Pancreas: No mass, inflammatory changes, or other parenchymal abnormality identified. Spleen:  Within normal limits in size and appearance. Adrenals/Urinary Tract: Left adrenal nodule measures 2.8 by 1.4 cm, image 55/8. There is loss of  signal on the out of phase sequence within this nodule compatible with a benign adenoma. Normal right adrenal gland. No kidney mass or hydronephrosis. Stomach/Bowel: Postoperative changes from previous gastric bypass surgery identified. Visualized portions of the bowel within the abdomen are otherwise unremarkable. Vascular/Lymphatic: Normal caliber of the abdominal aorta. Prominent porta hepatic lymph nodes are again identified. Index node measures 1.3 cm, image 25/3. Previously 1.6 cm. Adjacent lymph node measures 1.2 cm, image 26/3. Previously 1.3 cm. Portacaval node measures 1.1 cm, image 30/3. Previously 1.2 cm. No retroperitoneal adenopathy identified. Other:  No ascites or focal fluid collections. Musculoskeletal: No suspicious bone lesions identified. IMPRESSION: 1. Interval response to therapy. The right lobe of liver lesion has decreased in size in the interval. 2. Mild porta hepatic adenopathy is stable to slightly improved in the interval. 3. Left adrenal gland adenoma. Electronically Signed   By: Kerby Moors M.D.   On: 02/24/2018 10:44     ASSESSMENT: 68 y.o. [place] woman status post right breast upper outer quadrant and right axillary lymph node biopsy 07/19/2014 for a cT2  pN1, stage IIB  invasive ductal carcinoma, grade 2, estrogen and progesterone receptor negative, with an MIB-1 of 90%, and HER-2 amplified  (1) neoadjuvant chemotherapy started 08/08/2014, consisting of carboplatin, docetaxel, trastuzumab and pertuzumab given every 3 weeks 6, completed 11/21/2014  (a) breast MRI after initial 3 cycles shows a significant response  (b) breast MRI after 6 cycles shows a complete radiologic response   (2) trastuzumab continued through 10/02/2015 (to end of capecitabine therapy)  (a) echo 02/13/2015 shows an EF of 60-65%  (b)  echo 05/09/2015 shows an ejection fraction of 60-65%  (c) echocardiogram 06/27/2015 shows an ejection fraction of 55-60%.  (d) echocardiogram 01/22/2017  shows an ejection fraction of 55-60%  (3) right lumpectomy with sentinel lymph node biopsy on 12/27/14 showed a residual  ypT2 ypN1a, stage IIB invasive ductal carcinoma, grade 2, with repeat estrogen and progesterone receptors again negative  (4) status post right oncoplastic surgery (with left reduction mammoplasty) 01/06/2015 showing in the right breast multiple foci of intralymphatic carcinoma in random sections  (5)  Right modified radical mastectomy 02/15/2014 showed no residual invasive disease; there was DCIS but margins were negative; all 15 axillary lymph nodes  were clear; ypTis ypN0  (a)  The patient has decided against reconstruction.  (6)  Postmastectomy radiation completed 05/18/2015 with capecitabine sensitization  (7) adjuvant capecitabine continued through 09/27/2015  (8) hepatic steatosis with increased fibrosis score (at risk for cirrhosis)  (9) thyroid biopsy (left lobe inferiorly) 2 shows benign tissue 06/28/2015  (10) LOCAL RECURRENCE:  (a) PET scan 12/06/2015 Notes a right retropectoral lymph node measuring 0.9 cm with an SUV max of 5.5  (b) repeat PET scan 03/11/2016 finds the right subpectoral lymph node to now measure 1.3 cm with an SUV max of 11.8.  (c) PET scan 05/07/2016 shows a significant response to T-DM 1  (d) PET scan 07/10/2017 is clear except for what appears to be a single new liver lesion   (11) started T-DM1 03/21/2016, completed 8 doses 08/13/2016  (a) PET scan 08/19/2016 shows a good response but measurable residual disease  (b) PET scan on 01/16/2017 shows a continuing response  (12) maintenance trastuzumab started 09/03/2016--changed to every 28 days as of 01/28/2017  (a) repeat echocardiogram 07/08/2016 shows an ejection fraction of 60-65%.  (b) echocardiogram on 10/08/2016 demonstrates LVEF of 55-60% (followed by Dr. Haroldine Laws)  (c) echo 01/22/2017 shows an ejection fraction in the 55-60% range  (d) echo 04/24/2017 shows an ejection fraction  in the 55-60% range (recommendation for echo every 6 months by Dr. Aundra Dubin)  (e) trastuzumab changed to T-DM 1 after trastuzumab dose 07/15/2017  METASTATIC DISEASE: July 2019 (13)  Radiation to the area of persistent disease in the right upper chest completed 10/30/2016   (14) T-DM 1 resumed 08/12/2017, repeated every 21 days  (a) baseline liver MRI 08/02/2017 shows a 1.4 cm mass, "hot" on PET 07/10/2017  (b) repeat liver MRI 10/22/2018 3:19 cycles of T-DM 1, shows slight enlargement  (c) T-DM 1 discontinued after 09/23/2017 dose  (15) trastuzumab resumed 11/18/2017, to be repeated every 4 weeks  (16) liver biopsy 11/03/2017 shows adenocarcinoma, most consistent with an upper gastrointestinal primary; this tumor was estrogen receptor and HER-2 negative  (a) GI workup for occult primary ([place]) negative  (b) PET 12/09/2017 shows no obvious primary.  There is a right subpectoral lymph node noted as well  (c) CA 27-29 and CA 19-9 uninformative; CEA pending   (17) to start Abraxane on 12/10, given days 1 and 8 on a 21 day cycle x 3  (a)restaging with MRI liver 02/24/2018 shows a gratifying initial response  (18) consider Yttrium or other local treatment to liver per IR   PLAN: Darlene [name] is tolerating the Abraxane and trastuzumab well.  She is having a good response to this treatment.  Accordingly we are continuing for another 3 months alone as she continues to tolerate it well.  She is a bit behind on her echocardiogram and we have entered an order for that to be done in the next week or so  She will need a PET scan scheduled before her return visit here in May.  I am putting that in for the end of April.  She requests Pneumovax today and we are proceeding with that  She continues to have problems with her insurance.  I wish there were a way to totally resolve this but I have not been able to accomplish that so far  She will call with any problem that may develop before the next visit  here.  Magrinat, Virgie Dad, MD  03/03/18 10:32 AM Medical Oncology and Hematology [facility] [place], [place]  [place] Tel. [phone]    Fax. [phone]  I, Jacqualyn Posey am acting as a Education administrator for Chauncey Cruel, MD.   I, Lurline Del MD, have reviewed the above documentation for accuracy and completeness, and I agree with the above.

## 2018-03-03 ENCOUNTER — Inpatient Hospital Stay: Payer: Medicare Other

## 2018-03-03 ENCOUNTER — Inpatient Hospital Stay: Payer: Medicare Other | Attending: Oncology

## 2018-03-03 ENCOUNTER — Inpatient Hospital Stay (HOSPITAL_BASED_OUTPATIENT_CLINIC_OR_DEPARTMENT_OTHER): Payer: Medicare Other | Admitting: Oncology

## 2018-03-03 ENCOUNTER — Telehealth: Payer: Self-pay | Admitting: Oncology

## 2018-03-03 ENCOUNTER — Telehealth: Payer: Self-pay | Admitting: *Deleted

## 2018-03-03 ENCOUNTER — Encounter: Payer: Self-pay | Admitting: Oncology

## 2018-03-03 VITALS — BP 138/50 | HR 75 | Temp 97.8°F | Resp 18 | Ht 69.0 in | Wt 200.1 lb

## 2018-03-03 DIAGNOSIS — Z5111 Encounter for antineoplastic chemotherapy: Secondary | ICD-10-CM | POA: Diagnosis present

## 2018-03-03 DIAGNOSIS — Z5112 Encounter for antineoplastic immunotherapy: Secondary | ICD-10-CM | POA: Diagnosis not present

## 2018-03-03 DIAGNOSIS — C50911 Malignant neoplasm of unspecified site of right female breast: Secondary | ICD-10-CM

## 2018-03-03 DIAGNOSIS — Z171 Estrogen receptor negative status [ER-]: Secondary | ICD-10-CM

## 2018-03-03 DIAGNOSIS — C787 Secondary malignant neoplasm of liver and intrahepatic bile duct: Secondary | ICD-10-CM | POA: Diagnosis not present

## 2018-03-03 DIAGNOSIS — Z23 Encounter for immunization: Secondary | ICD-10-CM | POA: Insufficient documentation

## 2018-03-03 DIAGNOSIS — C50411 Malignant neoplasm of upper-outer quadrant of right female breast: Secondary | ICD-10-CM

## 2018-03-03 DIAGNOSIS — C801 Malignant (primary) neoplasm, unspecified: Secondary | ICD-10-CM

## 2018-03-03 DIAGNOSIS — Z95828 Presence of other vascular implants and grafts: Secondary | ICD-10-CM

## 2018-03-03 DIAGNOSIS — E119 Type 2 diabetes mellitus without complications: Secondary | ICD-10-CM

## 2018-03-03 LAB — CBC WITH DIFFERENTIAL/PLATELET
Abs Immature Granulocytes: 0.02 10*3/uL (ref 0.00–0.07)
Basophils Absolute: 0 10*3/uL (ref 0.0–0.1)
Basophils Relative: 0 %
Eosinophils Absolute: 0.1 10*3/uL (ref 0.0–0.5)
Eosinophils Relative: 3 %
HCT: 27.9 % — ABNORMAL LOW (ref 36.0–46.0)
Hemoglobin: 9.3 g/dL — ABNORMAL LOW (ref 12.0–15.0)
Immature Granulocytes: 1 %
Lymphocytes Relative: 22 %
Lymphs Abs: 0.6 10*3/uL — ABNORMAL LOW (ref 0.7–4.0)
MCH: 31.7 pg (ref 26.0–34.0)
MCHC: 33.3 g/dL (ref 30.0–36.0)
MCV: 95.2 fL (ref 80.0–100.0)
Monocytes Absolute: 0.3 10*3/uL (ref 0.1–1.0)
Monocytes Relative: 11 %
Neutro Abs: 1.8 10*3/uL (ref 1.7–7.7)
Neutrophils Relative %: 63 %
Platelets: 116 10*3/uL — ABNORMAL LOW (ref 150–400)
RBC: 2.93 MIL/uL — ABNORMAL LOW (ref 3.87–5.11)
RDW: 16 % — ABNORMAL HIGH (ref 11.5–15.5)
WBC: 2.9 10*3/uL — ABNORMAL LOW (ref 4.0–10.5)
nRBC: 0 % (ref 0.0–0.2)

## 2018-03-03 LAB — COMPREHENSIVE METABOLIC PANEL
ALT: 80 U/L — ABNORMAL HIGH (ref 0–44)
AST: 59 U/L — ABNORMAL HIGH (ref 15–41)
Albumin: 3.7 g/dL (ref 3.5–5.0)
Alkaline Phosphatase: 146 U/L — ABNORMAL HIGH (ref 38–126)
Anion gap: 7 (ref 5–15)
BUN: 26 mg/dL — ABNORMAL HIGH (ref 8–23)
CO2: 22 mmol/L (ref 22–32)
Calcium: 8.8 mg/dL — ABNORMAL LOW (ref 8.9–10.3)
Chloride: 114 mmol/L — ABNORMAL HIGH (ref 98–111)
Creatinine, Ser: 1.59 mg/dL — ABNORMAL HIGH (ref 0.44–1.00)
GFR calc Af Amer: 39 mL/min — ABNORMAL LOW (ref >60)
GFR calc non Af Amer: 34 mL/min — ABNORMAL LOW (ref >60)
Glucose, Bld: 156 mg/dL — ABNORMAL HIGH (ref 70–99)
Potassium: 4.4 mmol/L (ref 3.5–5.1)
Sodium: 143 mmol/L (ref 135–145)
Total Bilirubin: 0.6 mg/dL (ref 0.3–1.2)
Total Protein: 6.3 g/dL — ABNORMAL LOW (ref 6.5–8.1)

## 2018-03-03 MED ORDER — SODIUM CHLORIDE 0.9 % IV SOLN
Freq: Once | INTRAVENOUS | Status: AC
  Administered 2018-03-03: 11:00:00 via INTRAVENOUS
  Filled 2018-03-03: qty 250

## 2018-03-03 MED ORDER — DIPHENHYDRAMINE HCL 25 MG PO CAPS: 25.0000 mg | ORAL_CAPSULE | Freq: Once | ORAL | Status: DC

## 2018-03-03 MED ORDER — SODIUM CHLORIDE 0.9 % IV SOLN
Freq: Once | INTRAVENOUS | Status: DC
  Filled 2018-03-03: qty 250

## 2018-03-03 MED ORDER — PNEUMOCOCCAL 13-VAL CONJ VACC IM SUSP
0.5000 mL | Freq: Once | INTRAMUSCULAR | Status: AC
  Administered 2018-03-03: 0.5 mL via INTRAMUSCULAR
  Filled 2018-03-03: qty 0.5

## 2018-03-03 MED ORDER — SODIUM CHLORIDE 0.9% FLUSH
10.0000 mL | INTRAVENOUS | Status: DC | PRN
  Administered 2018-03-03: 10 mL
  Filled 2018-03-03: qty 10

## 2018-03-03 MED ORDER — PACLITAXEL PROTEIN-BOUND CHEMO INJECTION 100 MG
80.0000 mg/m2 | Freq: Once | INTRAVENOUS | Status: AC
  Administered 2018-03-03: 175 mg via INTRAVENOUS
  Filled 2018-03-03: qty 35

## 2018-03-03 MED ORDER — PROCHLORPERAZINE MALEATE 10 MG PO TABS: 10.0000 mg | ORAL_TABLET | Freq: Once | ORAL | Status: DC

## 2018-03-03 MED ORDER — PNEUMOCOCCAL 13-VAL CONJ VACC IM SUSP
0.5000 mL | Freq: Once | INTRAMUSCULAR | Status: DC
  Filled 2018-03-03: qty 0.5

## 2018-03-03 MED ORDER — HEPARIN SOD (PORK) LOCK FLUSH 100 UNIT/ML IV SOLN
500.0000 [IU] | Freq: Once | INTRAVENOUS | Status: AC | PRN
  Administered 2018-03-03: 500 [IU]
  Filled 2018-03-03: qty 5

## 2018-03-03 MED ORDER — SODIUM CHLORIDE 0.9% FLUSH
10.0000 mL | INTRAVENOUS | Status: DC | PRN
  Administered 2018-03-03: 10 mL via INTRAVENOUS
  Filled 2018-03-03: qty 10

## 2018-03-03 MED ORDER — DIPHENHYDRAMINE HCL 25 MG PO CAPS
ORAL_CAPSULE | ORAL | Status: AC
  Filled 2018-03-03: qty 1

## 2018-03-03 MED ORDER — ACETAMINOPHEN 325 MG PO TABS: 650.0000 mg | ORAL_TABLET | Freq: Once | ORAL | Status: DC

## 2018-03-03 MED ORDER — ACETAMINOPHEN 325 MG PO TABS
ORAL_TABLET | ORAL | Status: AC
  Filled 2018-03-03: qty 2

## 2018-03-03 MED ORDER — TRASTUZUMAB-ANNS CHEMO 420 MG IV SOLR
6.0000 mg/kg | Freq: Once | INTRAVENOUS | Status: AC
  Administered 2018-03-03: 546 mg via INTRAVENOUS
  Filled 2018-03-03: qty 20

## 2018-03-03 NOTE — Patient Instructions (Signed)
[place] Discharge Instructions for Patients Receiving Chemotherapy  Today you received the following chemotherapy agents Kanjinti and Abraxane  To help prevent nausea and vomiting after your treatment, we encourage you to take your nausea medication as directed.   If you develop nausea and vomiting that is not controlled by your nausea medication, call the clinic.   BELOW ARE SYMPTOMS THAT SHOULD BE REPORTED IMMEDIATELY:  *FEVER GREATER THAN 100.5 F  *CHILLS WITH OR WITHOUT FEVER  NAUSEA AND VOMITING THAT IS NOT CONTROLLED WITH YOUR NAUSEA MEDICATION  *UNUSUAL SHORTNESS OF BREATH  *UNUSUAL BRUISING OR BLEEDING  TENDERNESS IN MOUTH AND THROAT WITH OR WITHOUT PRESENCE OF ULCERS  *URINARY PROBLEMS  *BOWEL PROBLEMS  UNUSUAL RASH Items with * indicate a potential emergency and should be followed up as soon as possible.  Feel free to call the clinic you have any questions or concerns. The clinic phone number is [phone].  Please show the [place] at check-in to the Emergency Department and triage nurse.

## 2018-03-03 NOTE — Telephone Encounter (Signed)
Gave avs and calendar ° °

## 2018-03-03 NOTE — Progress Notes (Signed)
Office: [phone]  /  Fax: [phone]   HPI:   Chief Complaint: OBESITY Darlene [name] is here to discuss her progress with her obesity treatment plan. She is on the Category 2 plan and is following her eating plan approximately 50 % of the time. She states she is exercising 0 minutes 0 times per week. Darlene [name] is continuing to concentrate on her cancer treatment and has been mindful of her food choices and trying to increase vegetables and antioxidant rich foods but hasn't been journaling. Hunger is controlled she is not skipping meals. Her weight is 195 lb (88.5 kg) today and has gained 1 lbs since her last visit. She has lost 25 lbs since starting treatment with [name].  Depression with emotional eating behaviors Darlene [name] is struggling with emotional eating and using food for comfort to the extent that it is negatively impacting her health. She often snacks when she is not hungry. Darlene [name] sometimes feels she is out of control and then feels guilty that she made poor food choices. She has been working on behavior modification techniques to help reduce her emotional eating and has been somewhat successful. She shows no sign of suicidal or homicidal ideations. Her mood is stable. Darlene [name] is working through cancer diagnosis. She is sleeping well.  Depression screen [facility] 2/9 09/12/2016 08/09/2016  Decreased Interest 0 1  Down, Depressed, Hopeless 0 1  PHQ - 2 Score 0 2  Altered sleeping - 0  Tired, decreased energy - 1  Change in appetite - 2  Feeling bad or failure about yourself  - 2  Trouble concentrating - 1  Moving slowly or fidgety/restless - 1  Suicidal thoughts - 0  PHQ-9 Score - 9  Some recent data might be hidden   Diabetes II Darlene [name] has a diagnosis of diabetes type II. Darlene [name] is stable. She denies nausea, vomiting or hypoglycemia She is working on diet.  Last A1c was Hemoglobin A1C Latest Ref Rng & Units 09/03/2017 03/11/2017  HGBA1C 4.8 - 5.6 % 5.8(H) 6.1(H)  Some recent data  might be hidden    She has been working on intensive lifestyle modifications including diet, exercise, and weight loss to help control her blood glucose levels.  Hypertension Darlene [name] is a 68 y.o. female with hypertension.  Darlene [name] denies chest pain, headache or lightheaded. She is working on weight loss to help control her blood pressure with the goal of decreasing her risk of heart attack and stroke. She is doing well on diet prescription.  Darlene [name] blood pressure is currently controlled.  ASSESSMENT AND PLAN:  Type 2 diabetes mellitus without complication, with long-term current use of insulin (HCC) - Plan: metFORMIN (GLUCOPHAGE) 500 MG tablet, liraglutide (VICTOZA) 18 MG/3ML SOPN, Insulin Glargine (LANTUS SOLOSTAR) 100 UNIT/ML Solostar Pen, Insulin Pen Needle (BD PEN NEEDLE NANO U/F) 32G X 4 MM MISC  Depression with anxiety - Plan: buPROPion (WELLBUTRIN SR) 200 MG 12 hr tablet, topiramate (TOPAMAX) 25 MG tablet  Essential hypertension - Plan: losartan (COZAAR) 100 MG tablet  Other depression - with emotional eating - Plan: vortioxetine HBr (TRINTELLIX) 20 MG TABS tablet  Class 1 obesity with serious comorbidity and body mass index (BMI) of 30.0 to 30.9 in adult, unspecified obesity type - Starting BMI greater then 30  PLAN:  Depression with Emotional Eating Behaviors We discussed behavior modification techniques today to help Darlene [name] deal with her emotional eating and depression. Darlene [name] agrees to continue taking Wellbutrin 200 mg qd,Trintellix 20 mg qhs,Topamax 25 mg qd  prescriptions were written for 1 month refills. Darlene [name] agrees to follow up with our clinic in 4 weeks.  Diabetes II Darlene [name] has been given extensive diabetes education by myself today including ideal fasting and post-prandial blood glucose readings, individual ideal Hgb A1c goals  and hypoglycemia prevention. We discussed the importance of good blood sugar control to decrease the likelihood  of diabetic complications such as nephropathy, neuropathy, limb loss, blindness, coronary artery disease, and death. We discussed the importance of intensive lifestyle modification including diet, exercise and weight loss as the first line treatment for diabetes. Darlene [name] agrees to continue her diabetes medications and prescriptions were written today for one month refills of metformin, Lantuss and Victoza. Prescription was written today for nano needles (use 2 times daily). [name] agrees to follow up with our clinic in 4 weeks.  Hypertension We discussed sodium restriction, working on healthy weight loss, and a regular exercise program as the means to achieve improved blood pressure control. Darlene [name] agreed with this plan and agreed to follow up as directed. We will continue to monitor her blood pressure as well as her progress with the above lifestyle modifications. Darlene [name] agrees to continue taking losartan 100 mg qd with no refills and will watch for signs of hypotension as she continues her lifestyle modifications. Darlene [name] agrees to follow up with our clinic in 4 weeks.  Obesity Darlene [name] is currently in the action stage of change. As such, her goal is to continue with weight loss efforts She has agreed to keep a food journal with 1300-1500 calories and 80 grams of protein  Darlene [name] has been instructed to work up to a goal of 150 minutes of combined cardio and strengthening exercise per week for weight loss and overall health benefits. We discussed the following Behavioral Modification Strategies today: increasing lean protein intake, increasing vegetables, work on meal planning and easy cooking plans, increase H20 intake and keeping healthy foods in the home Goal is to maintain weight for now and concentrate on healthy foods. Darlene [name] has agreed to follow up with our clinic in 4 weeks. She was informed of the importance of frequent follow up visits to maximize her success with intensive  lifestyle modifications for her multiple health conditions.  ALLERGIES: Allergies  Allergen Reactions   Nsaids Other (See Comments)    H/o gastric bypass - avoid NSAIDs   Tape Hives, Rash and Other (See Comments)    Adhesives in bandaids    MEDICATIONS: Current Outpatient Medications on File Prior to Visit  Medication Sig Dispense Refill   acetaminophen (TYLENOL) 325 MG tablet Take 650 mg by mouth 2 (two) times daily as needed for moderate pain or headache.      CALCIUM CITRATE-VITAMIN D PO Take 1 tablet by mouth 3 (three) times daily.      cholecalciferol (VITAMIN D) 1000 units tablet Take 1,000 Units by mouth daily.     Cyanocobalamin (VITAMIN B-12 PO) Take 1 tablet by mouth every 14 (fourteen) days.     diphenhydrAMINE (BENADRYL) 25 MG tablet Take 25 mg by mouth daily as needed for allergies or sleep.      loratadine (CLARITIN) 10 MG tablet Take 10 mg by mouth as needed for allergies.     Melatonin 3 MG CAPS Take 3 mg by mouth at bedtime.      ONETOUCH DELICA LANCETS 23J MISC 1 Package by Does not apply route daily. 100 each 0   Pediatric Multivitamins-Iron (FLINTSTONES PLUS IRON) chewable tablet Chew 1 tablet by mouth 2 (two)  times daily.     Probiotic Product ([employer] PROBIOTIC PO) Take 1 capsule by mouth daily.      prochlorperazine (COMPAZINE) 10 MG tablet Take 1 tablet (10 mg total) by mouth every 6 (six) hours as needed for nausea or vomiting. 30 tablet 0   Propylene Glycol (SYSTANE BALANCE OP) Place 1-2 drops into both eyes 3 (three) times daily as needed (for dry eyes).      senna-docusate (SENNA PLUS) 8.6-50 MG tablet Take 1 tablet by mouth daily as needed for moderate constipation.      No current facility-administered medications on file prior to visit.     PAST MEDICAL HISTORY: Past Medical History:  Diagnosis Date   Anemia    hx of - during 1st round chemo   Anxiety    Arthritis    in fingers, shoulders   Back pain    Balance problem    Breast  cancer ([place])    Breast cancer of upper-outer quadrant of right female breast ([place]) 07/22/2014   Bunion, right foot    Clumsiness    Constipation    Depression    Diabetes mellitus without complication ([place])    24+ years   Eating disorder    binge eating   Epistaxis 08/26/2014   Fatty liver    HCAP (healthcare-associated pneumonia) 12/18/2014   Headache    Heart murmur    never had any problems   History of radiation therapy 04/05/15-05/19/15   right chest wall was treated to 50.4 Gy in 28 fractions, right mastectomy scar was treated to 10 Gy in 5 fractions   Hyperlipidemia    Hypertension    Joint pain    Multiple food allergies    Neuromuscular disorder ([place])    diabetic neuropathy   Obesity    Obstructive sleep apnea on CPAP    uses cpap nightly   Ovarian cyst rupture    possible   Pneumonia    Pneumonia 11/2014   Radiation 04/05/15-05/19/15   right chest wall 50.4 Gy, mastectomy scar 10 Gy   Sleep apnea    Thyroid nodule 06/2014    PAST SURGICAL HISTORY: Past Surgical History:  Procedure Laterality Date   APPENDECTOMY     BIOPSY THYROID Bilateral 06/2014   2 nodules - Benign    BREAST LUMPECTOMY WITH RADIOACTIVE SEED AND SENTINEL LYMPH NODE BIOPSY Right 12/27/2014   Procedure: BREAST LUMPECTOMY WITH RADIOACTIVE SEED AND SENTINEL LYMPH NODE BIOPSY;  Surgeon: Stark Klein, MD;  Location: [place];  Service: General;  Laterality: Right;   BREAST REDUCTION SURGERY Bilateral 01/06/2015   Procedure: BILATERAL BREAST REDUCTION, RIGHT ONCOPLASTIC RECONSTRUCTION),LEFT BREAST REDUCTION FOR SYMMETRY;  Surgeon: [name], MD;  Location: [place];  Service: Plastics;  Laterality: Bilateral;   BREATH TEK H PYLORI N/A 09/15/2012   Procedure: BREATH TEK H PYLORI;  Surgeon: Pedro Earls, MD;  Location: [facility];  Service: General;  Laterality: N/A;   BUNIONECTOMY Left 12/2013   CARPAL TUNNEL RELEASE Right    CARPAL TUNNEL RELEASE  Left    CATARACT EXTRACTION     6/19   COLONOSCOPY     DUPUYTREN CONTRACTURE RELEASE     GASTRIC ROUX-EN-Y N/A 11/02/2012   Procedure: LAPAROSCOPIC ROUX-EN-Y GASTRIC;  Surgeon: Pedro Earls, MD;  Location: [facility];  Service: General;  Laterality: N/A;   IR GENERIC HISTORICAL  03/18/2016   IR US GUIDE VASC ACCESS LEFT 03/18/2016 Darlene Daft, MD [facility]   IR GENERIC HISTORICAL  03/18/2016   IR FLUORO GUIDE CV LINE LEFT 03/18/2016 Darlene Daft, MD [facility]   LAPAROSCOPIC APPENDECTOMY N/A 07/22/2015   Procedure: APPENDECTOMY LAPAROSCOPIC;  Surgeon: Michael Boston, MD;  Location: [facility];  Service: General;  Laterality: N/A;   MASTECTOMY Right 02/15/2015   modified   MODIFIED MASTECTOMY Right 02/15/2015   Procedure: RIGHT MODIFIED RADICAL MASTECTOMY;  Surgeon: Stark Klein, MD;  Location: [place];  Service: General;  Laterality: Right;   PORT-A-CATH REMOVAL Left 10/10/2015   Procedure: PORT REMOVAL;  Surgeon: Stark Klein, MD;  Location: [place];  Service: General;  Laterality: Left;   PORTACATH PLACEMENT Left 08/02/2014   Procedure: INSERTION PORT-A-CATH;  Surgeon: Stark Klein, MD;  Location: [place];  Service: General;  Laterality: Left;   PORTACATH PLACEMENT N/A 11/05/2016   Procedure: INSERTION PORT-A-CATH;  Surgeon: Stark Klein, MD;  Location: [place];  Service: General;  Laterality: N/A;   ROTATOR CUFF REPAIR Right 2005   SCAR REVISION Right 12/08/2015   Procedure: COMPLEX REPAIR RIGHT CHEST 10-15CM;  Surgeon: [name], MD;  Location: [place];  Service: Plastics;  Laterality: Right;   TOE SURGERY  1970s   bone spur   TRIGGER FINGER RELEASE     x3    SOCIAL HISTORY: Social History   Tobacco Use   Smoking status: Never Smoker   Smokeless tobacco: Never Used  Substance Use Topics   Alcohol use: Yes    Alcohol/week: 1.0 standard drinks    Types: 1 Standard drinks or equivalent per week    Comment: social   Drug use: No     FAMILY HISTORY: Family History  Problem Relation Age of Onset   CVA Mother    Heart disease Mother    Sudden death Mother    Diabetes Father    Heart failure Father    Hypertension Father    Kidney disease Father    Obesity Father    Hypertension Sister    Diabetes Brother    Breast cancer Paternal Grandmother 45   Diabetes Paternal Uncle    Ovarian cancer Neg Hx     ROS: Review of Systems  Constitutional: Negative for weight loss.  Respiratory: Negative for shortness of breath.   Cardiovascular: Negative for chest pain.  Gastrointestinal: Negative for diarrhea, nausea and vomiting.  Endo/Heme/Allergies:       Negative for hypoglycemia   Psychiatric/Behavioral: Positive for depression. Negative for suicidal ideas.       Negative for homicidal ideation    PHYSICAL EXAM: Blood pressure 130/73, pulse 65, temperature 97.8 F (36.6 C), temperature source Oral, height 5\' 9"  (1.753 m), weight 195 lb (88.5 kg), last menstrual period 10/22/2007, SpO2 100 %. Body mass index is 28.8 kg/m. Physical Exam Vitals signs reviewed.  Constitutional:      Appearance: Normal appearance. She is obese.  Cardiovascular:     Rate and Rhythm: Normal rate.     Pulses: Normal pulses.  Pulmonary:     Effort: Pulmonary effort is normal.  Musculoskeletal: Normal range of motion.  Skin:    General: Skin is warm and dry.  Neurological:     Mental Status: She is alert and oriented to person, place, and time.  Psychiatric:        Mood and Affect: Mood normal.        Behavior: Behavior normal.     RECENT LABS AND TESTS: BMET    Component Value Date/Time   NA 143 03/03/2018 0931   NA  143 03/11/2017 0841   NA 140 01/07/2017 0758   K 4.4 03/03/2018 0931   K 4.1 01/07/2017 0758   CL 114 (H) 03/03/2018 0931   CO2 22 03/03/2018 0931   CO2 24 01/07/2017 0758   GLUCOSE 156 (H) 03/03/2018 0931   GLUCOSE 134 01/07/2017 0758   BUN 26 (H) 03/03/2018 0931   BUN 40 (H)  03/11/2017 0841   BUN 33.6 (H) 01/07/2017 0758   CREATININE 1.59 (H) 03/03/2018 0931   CREATININE 1.74 (H) 12/16/2017 0806   CREATININE 1.5 (H) 01/07/2017 0758   CALCIUM 8.8 (L) 03/03/2018 0931   CALCIUM 9.3 01/07/2017 0758   GFRNONAA 34 (L) 03/03/2018 0931   GFRNONAA 30 (L) 12/16/2017 0806   GFRNONAA 55 (L) 02/18/2013 0827   GFRAA 39 (L) 03/03/2018 0931   GFRAA 35 (L) 12/16/2017 0806   GFRAA 63 02/18/2013 0827   Lab Results  Component Value Date   HGBA1C 5.8 (H) 09/03/2017   HGBA1C 6.1 (H) 03/11/2017   HGBA1C 8.2 (H) 11/05/2016   HGBA1C 8.1 (H) 08/09/2016   HGBA1C 6.5 (H) 01/03/2015   Lab Results  Component Value Date   INSULIN 15.3 03/11/2017   INSULIN 18.3 08/09/2016   CBC    Component Value Date/Time   WBC 2.9 (L) 03/03/2018 0931   RBC 2.93 (L) 03/03/2018 0931   HGB 9.3 (L) 03/03/2018 0931   HGB 10.6 (L) 12/16/2017 0806   HGB 10.8 (L) 01/07/2017 0758   HCT 27.9 (L) 03/03/2018 0931   HCT 31.9 (L) 01/07/2017 0758   PLT 116 (L) 03/03/2018 0931   PLT 83 (L) 12/16/2017 0806   PLT 126 (L) 01/07/2017 0758   MCV 95.2 03/03/2018 0931   MCV 90.8 01/07/2017 0758   MCH 31.7 03/03/2018 0931   MCHC 33.3 03/03/2018 0931   RDW 16.0 (H) 03/03/2018 0931   RDW 14.8 (H) 01/07/2017 0758   LYMPHSABS 0.6 (L) 03/03/2018 0931   LYMPHSABS 1.1 01/07/2017 0758   MONOABS 0.3 03/03/2018 0931   MONOABS 0.3 01/07/2017 0758   EOSABS 0.1 03/03/2018 0931   EOSABS 0.1 01/07/2017 0758   EOSABS 0.1 08/09/2016 1229   BASOSABS 0.0 03/03/2018 0931   BASOSABS 0.0 01/07/2017 0758   Iron/TIBC/Ferritin/ %Sat    Component Value Date/Time   IRON 58 02/18/2013 0827   TIBC 305 02/18/2013 0827   FERRITIN 133 10/31/2014 0816   IRONPCTSAT 19 (L) 02/18/2013 0827   Lipid Panel     Component Value Date/Time   CHOL 160 09/03/2017 1349   TRIG 111 09/03/2017 1349   HDL 59 09/03/2017 1349   CHOLHDL 2.9 02/18/2013 0827   VLDL 23 02/18/2013 0827   LDLCALC 79 09/03/2017 1349   Hepatic Function Panel      Component Value Date/Time   PROT 6.3 (L) 03/03/2018 0931   PROT 6.9 03/11/2017 0841   PROT 6.7 01/07/2017 0758   ALBUMIN 3.7 03/03/2018 0931   ALBUMIN 4.5 03/11/2017 0841   ALBUMIN 3.6 01/07/2017 0758   AST 59 (H) 03/03/2018 0931   AST 43 (H) 12/16/2017 0806   AST 51 (H) 01/07/2017 0758   ALT 80 (H) 03/03/2018 0931   ALT 57 (H) 12/16/2017 0806   ALT 82 (H) 01/07/2017 0758   ALKPHOS 146 (H) 03/03/2018 0931   ALKPHOS 115 01/07/2017 0758   BILITOT 0.6 03/03/2018 0931   BILITOT 0.6 12/16/2017 0806   BILITOT 0.72 01/07/2017 0758      Component Value Date/Time   TSH 1.54 02/07/2017   TSH 2.170  08/09/2016 1229      OBESITY BEHAVIORAL INTERVENTION VISIT  Today's visit was # 31   Starting weight: 225 lbs Starting date: 08/09/2016 Today's weight :195 lbs Today's date: 02/25/2018 Total lbs lost to date: 25 At least 15 minutes were spent on discussing the following behavioral intervention visit.   ASK: We discussed the diagnosis of obesity with Darlene [name] today and Markella agreed to give [name] permission to discuss obesity behavioral modification therapy today.  ASSESS: Hailynn has the diagnosis of obesity and her BMI today is 29.54 Noretta is in the action stage of change   ADVISE: Christee was educated on the multiple health risks of obesity as well as the benefit of weight loss to improve her health. She was advised of the need for long term treatment and the importance of lifestyle modifications to improve her current health and to decrease her risk of future health problems.  AGREE: Multiple dietary modification options and treatment options were discussed and  Lilliahna agreed to follow the recommendations documented in the above note.  ARRANGE: Alverna was educated on the importance of frequent visits to treat obesity as outlined per CMS and USPSTF guidelines and agreed to schedule her next follow up appointment today.  I, Tammy Wysor, am acting as  Location manager for [employer]

## 2018-03-03 NOTE — Telephone Encounter (Signed)
OK to proceed with treatment today with prior ECHO from 10/23/2017 and with serum creatinine of 1.59.  Echo order entered for next available for future chemotherapy.

## 2018-03-06 ENCOUNTER — Ambulatory Visit (HOSPITAL_COMMUNITY): Payer: Medicare Other | Attending: Cardiology

## 2018-03-06 DIAGNOSIS — C50411 Malignant neoplasm of upper-outer quadrant of right female breast: Secondary | ICD-10-CM | POA: Diagnosis not present

## 2018-03-06 DIAGNOSIS — C50911 Malignant neoplasm of unspecified site of right female breast: Secondary | ICD-10-CM

## 2018-03-06 DIAGNOSIS — E119 Type 2 diabetes mellitus without complications: Secondary | ICD-10-CM | POA: Diagnosis not present

## 2018-03-06 DIAGNOSIS — I080 Rheumatic disorders of both mitral and aortic valves: Secondary | ICD-10-CM | POA: Insufficient documentation

## 2018-03-06 DIAGNOSIS — C801 Malignant (primary) neoplasm, unspecified: Secondary | ICD-10-CM

## 2018-03-06 DIAGNOSIS — Z171 Estrogen receptor negative status [ER-]: Secondary | ICD-10-CM | POA: Diagnosis not present

## 2018-03-06 DIAGNOSIS — C787 Secondary malignant neoplasm of liver and intrahepatic bile duct: Secondary | ICD-10-CM

## 2018-03-06 DIAGNOSIS — E785 Hyperlipidemia, unspecified: Secondary | ICD-10-CM | POA: Diagnosis not present

## 2018-03-06 DIAGNOSIS — G4733 Obstructive sleep apnea (adult) (pediatric): Secondary | ICD-10-CM | POA: Diagnosis not present

## 2018-03-06 DIAGNOSIS — I119 Hypertensive heart disease without heart failure: Secondary | ICD-10-CM | POA: Diagnosis not present

## 2018-03-10 ENCOUNTER — Inpatient Hospital Stay: Payer: Medicare Other

## 2018-03-10 ENCOUNTER — Ambulatory Visit: Payer: Medicare Other

## 2018-03-10 ENCOUNTER — Other Ambulatory Visit: Payer: Medicare Other

## 2018-03-10 DIAGNOSIS — C801 Malignant (primary) neoplasm, unspecified: Secondary | ICD-10-CM

## 2018-03-10 DIAGNOSIS — C787 Secondary malignant neoplasm of liver and intrahepatic bile duct: Secondary | ICD-10-CM

## 2018-03-10 DIAGNOSIS — Z171 Estrogen receptor negative status [ER-]: Secondary | ICD-10-CM

## 2018-03-10 DIAGNOSIS — C50911 Malignant neoplasm of unspecified site of right female breast: Secondary | ICD-10-CM

## 2018-03-10 DIAGNOSIS — C50411 Malignant neoplasm of upper-outer quadrant of right female breast: Secondary | ICD-10-CM

## 2018-03-10 DIAGNOSIS — Z95828 Presence of other vascular implants and grafts: Secondary | ICD-10-CM

## 2018-03-10 DIAGNOSIS — Z5112 Encounter for antineoplastic immunotherapy: Secondary | ICD-10-CM | POA: Diagnosis not present

## 2018-03-10 LAB — COMPREHENSIVE METABOLIC PANEL
ALT: 64 U/L — ABNORMAL HIGH (ref 0–44)
AST: 45 U/L — ABNORMAL HIGH (ref 15–41)
Albumin: 3.9 g/dL (ref 3.5–5.0)
Alkaline Phosphatase: 149 U/L — ABNORMAL HIGH (ref 38–126)
Anion gap: 7 (ref 5–15)
BUN: 29 mg/dL — ABNORMAL HIGH (ref 8–23)
CO2: 22 mmol/L (ref 22–32)
Calcium: 9 mg/dL (ref 8.9–10.3)
Chloride: 111 mmol/L (ref 98–111)
Creatinine, Ser: 1.66 mg/dL — ABNORMAL HIGH (ref 0.44–1.00)
GFR calc Af Amer: 37 mL/min — ABNORMAL LOW (ref >60)
GFR calc non Af Amer: 32 mL/min — ABNORMAL LOW (ref >60)
Glucose, Bld: 133 mg/dL — ABNORMAL HIGH (ref 70–99)
Potassium: 4.2 mmol/L (ref 3.5–5.1)
Sodium: 140 mmol/L (ref 135–145)
Total Bilirubin: 0.7 mg/dL (ref 0.3–1.2)
Total Protein: 6.7 g/dL (ref 6.5–8.1)

## 2018-03-10 LAB — CBC WITH DIFFERENTIAL/PLATELET
Abs Immature Granulocytes: 0.02 10*3/uL (ref 0.00–0.07)
Basophils Absolute: 0 10*3/uL (ref 0.0–0.1)
Basophils Relative: 1 %
Eosinophils Absolute: 0.1 10*3/uL (ref 0.0–0.5)
Eosinophils Relative: 3 %
HCT: 28.3 % — ABNORMAL LOW (ref 36.0–46.0)
Hemoglobin: 9.4 g/dL — ABNORMAL LOW (ref 12.0–15.0)
Immature Granulocytes: 1 %
Lymphocytes Relative: 29 %
Lymphs Abs: 1.1 10*3/uL (ref 0.7–4.0)
MCH: 31.5 pg (ref 26.0–34.0)
MCHC: 33.2 g/dL (ref 30.0–36.0)
MCV: 95 fL (ref 80.0–100.0)
Monocytes Absolute: 0.2 10*3/uL (ref 0.1–1.0)
Monocytes Relative: 5 %
Neutro Abs: 2.3 10*3/uL (ref 1.7–7.7)
Neutrophils Relative %: 61 %
Platelets: 151 10*3/uL (ref 150–400)
RBC: 2.98 MIL/uL — ABNORMAL LOW (ref 3.87–5.11)
RDW: 15.3 % (ref 11.5–15.5)
WBC: 3.8 10*3/uL — ABNORMAL LOW (ref 4.0–10.5)
nRBC: 0 % (ref 0.0–0.2)

## 2018-03-10 MED ORDER — PACLITAXEL PROTEIN-BOUND CHEMO INJECTION 100 MG
80.0000 mg/m2 | Freq: Once | INTRAVENOUS | Status: AC
  Administered 2018-03-10: 175 mg via INTRAVENOUS
  Filled 2018-03-10: qty 35

## 2018-03-10 MED ORDER — SODIUM CHLORIDE 0.9% FLUSH
10.0000 mL | INTRAVENOUS | Status: DC | PRN
  Administered 2018-03-10: 10 mL
  Filled 2018-03-10: qty 10

## 2018-03-10 MED ORDER — SODIUM CHLORIDE 0.9 % IV SOLN
Freq: Once | INTRAVENOUS | Status: AC
  Administered 2018-03-10: 16:00:00 via INTRAVENOUS
  Filled 2018-03-10: qty 250

## 2018-03-10 MED ORDER — PROCHLORPERAZINE MALEATE 10 MG PO TABS: 10.0000 mg | ORAL_TABLET | Freq: Once | ORAL | Status: DC

## 2018-03-10 MED ORDER — HEPARIN SOD (PORK) LOCK FLUSH 100 UNIT/ML IV SOLN
500.0000 [IU] | Freq: Once | INTRAVENOUS | Status: AC | PRN
  Administered 2018-03-10: 500 [IU]
  Filled 2018-03-10: qty 5

## 2018-03-10 NOTE — Progress Notes (Signed)
Ok per Dr. Jana Hakim to treat with Scr 1.66

## 2018-03-10 NOTE — Patient Instructions (Signed)
[facility] Discharge Instructions for Patients Receiving Chemotherapy  Today you received the following chemotherapy agents Abraxane.  To help prevent nausea and vomiting after your treatment, we encourage you to take your nausea medication as instructed.  If you develop nausea and vomiting that is not controlled by your nausea medication, call the clinic.   BELOW ARE SYMPTOMS THAT SHOULD BE REPORTED IMMEDIATELY:  *FEVER GREATER THAN 100.5 F  *CHILLS WITH OR WITHOUT FEVER  NAUSEA AND VOMITING THAT IS NOT CONTROLLED WITH YOUR NAUSEA MEDICATION  *UNUSUAL SHORTNESS OF BREATH  *UNUSUAL BRUISING OR BLEEDING  TENDERNESS IN MOUTH AND THROAT WITH OR WITHOUT PRESENCE OF ULCERS  *URINARY PROBLEMS  *BOWEL PROBLEMS  UNUSUAL RASH Items with * indicate a potential emergency and should be followed up as soon as possible.  Feel free to call the clinic should you have any questions or concerns. The clinic phone number is [phone].  Please show the [place] at check-in to the Emergency Department and triage nurse.

## 2018-03-17 ENCOUNTER — Other Ambulatory Visit: Payer: Medicare Other

## 2018-03-17 ENCOUNTER — Ambulatory Visit: Payer: Medicare Other | Admitting: Adult Health

## 2018-03-17 ENCOUNTER — Ambulatory Visit: Payer: Medicare Other

## 2018-03-24 ENCOUNTER — Telehealth: Payer: Self-pay | Admitting: *Deleted

## 2018-03-24 ENCOUNTER — Inpatient Hospital Stay: Payer: Medicare Other | Attending: Oncology

## 2018-03-24 ENCOUNTER — Inpatient Hospital Stay: Payer: Medicare Other

## 2018-03-24 VITALS — BP 129/54 | HR 63 | Temp 98.3°F | Resp 16

## 2018-03-24 DIAGNOSIS — Z5111 Encounter for antineoplastic chemotherapy: Secondary | ICD-10-CM | POA: Insufficient documentation

## 2018-03-24 DIAGNOSIS — Z171 Estrogen receptor negative status [ER-]: Secondary | ICD-10-CM

## 2018-03-24 DIAGNOSIS — C787 Secondary malignant neoplasm of liver and intrahepatic bile duct: Secondary | ICD-10-CM | POA: Diagnosis not present

## 2018-03-24 DIAGNOSIS — C50411 Malignant neoplasm of upper-outer quadrant of right female breast: Secondary | ICD-10-CM

## 2018-03-24 DIAGNOSIS — C801 Malignant (primary) neoplasm, unspecified: Secondary | ICD-10-CM

## 2018-03-24 DIAGNOSIS — C50911 Malignant neoplasm of unspecified site of right female breast: Secondary | ICD-10-CM

## 2018-03-24 DIAGNOSIS — Z95828 Presence of other vascular implants and grafts: Secondary | ICD-10-CM

## 2018-03-24 DIAGNOSIS — Z5112 Encounter for antineoplastic immunotherapy: Secondary | ICD-10-CM | POA: Insufficient documentation

## 2018-03-24 LAB — COMPREHENSIVE METABOLIC PANEL
ALT: 79 U/L — ABNORMAL HIGH (ref 0–44)
AST: 61 U/L — ABNORMAL HIGH (ref 15–41)
Albumin: 3.6 g/dL (ref 3.5–5.0)
Alkaline Phosphatase: 130 U/L — ABNORMAL HIGH (ref 38–126)
Anion gap: 8 (ref 5–15)
BUN: 25 mg/dL — ABNORMAL HIGH (ref 8–23)
CO2: 22 mmol/L (ref 22–32)
Calcium: 8.7 mg/dL — ABNORMAL LOW (ref 8.9–10.3)
Chloride: 113 mmol/L — ABNORMAL HIGH (ref 98–111)
Creatinine, Ser: 1.53 mg/dL — ABNORMAL HIGH (ref 0.44–1.00)
GFR calc Af Amer: 41 mL/min — ABNORMAL LOW (ref >60)
GFR calc non Af Amer: 35 mL/min — ABNORMAL LOW (ref >60)
Glucose, Bld: 125 mg/dL — ABNORMAL HIGH (ref 70–99)
Potassium: 4 mmol/L (ref 3.5–5.1)
Sodium: 143 mmol/L (ref 135–145)
Total Bilirubin: 0.6 mg/dL (ref 0.3–1.2)
Total Protein: 6.2 g/dL — ABNORMAL LOW (ref 6.5–8.1)

## 2018-03-24 LAB — CBC WITH DIFFERENTIAL/PLATELET
Abs Immature Granulocytes: 0.01 10*3/uL (ref 0.00–0.07)
Basophils Absolute: 0 10*3/uL (ref 0.0–0.1)
Basophils Relative: 1 %
Eosinophils Absolute: 0.1 10*3/uL (ref 0.0–0.5)
Eosinophils Relative: 2 %
HCT: 28.8 % — ABNORMAL LOW (ref 36.0–46.0)
Hemoglobin: 9.4 g/dL — ABNORMAL LOW (ref 12.0–15.0)
Immature Granulocytes: 0 %
Lymphocytes Relative: 32 %
Lymphs Abs: 1 10*3/uL (ref 0.7–4.0)
MCH: 31.8 pg (ref 26.0–34.0)
MCHC: 32.6 g/dL (ref 30.0–36.0)
MCV: 97.3 fL (ref 80.0–100.0)
Monocytes Absolute: 0.4 10*3/uL (ref 0.1–1.0)
Monocytes Relative: 12 %
Neutro Abs: 1.7 10*3/uL (ref 1.7–7.7)
Neutrophils Relative %: 53 %
Platelets: 140 10*3/uL — ABNORMAL LOW (ref 150–400)
RBC: 2.96 MIL/uL — ABNORMAL LOW (ref 3.87–5.11)
RDW: 15.9 % — ABNORMAL HIGH (ref 11.5–15.5)
WBC: 3.1 10*3/uL — ABNORMAL LOW (ref 4.0–10.5)
nRBC: 0 % (ref 0.0–0.2)

## 2018-03-24 MED ORDER — ACETAMINOPHEN 325 MG PO TABS
ORAL_TABLET | ORAL | Status: AC
  Filled 2018-03-24: qty 2

## 2018-03-24 MED ORDER — PROCHLORPERAZINE MALEATE 10 MG PO TABS
10.0000 mg | ORAL_TABLET | Freq: Once | ORAL | Status: AC
  Administered 2018-03-24: 10 mg via ORAL

## 2018-03-24 MED ORDER — PROCHLORPERAZINE MALEATE 10 MG PO TABS
ORAL_TABLET | ORAL | Status: AC
  Filled 2018-03-24: qty 1

## 2018-03-24 MED ORDER — TRASTUZUMAB-ANNS CHEMO 420 MG IV SOLR
6.0000 mg/kg | Freq: Once | INTRAVENOUS | Status: AC
  Administered 2018-03-24: 546 mg via INTRAVENOUS
  Filled 2018-03-24: qty 20

## 2018-03-24 MED ORDER — DIPHENHYDRAMINE HCL 25 MG PO CAPS
25.0000 mg | ORAL_CAPSULE | Freq: Once | ORAL | Status: AC
  Administered 2018-03-24: 25 mg via ORAL

## 2018-03-24 MED ORDER — SODIUM CHLORIDE 0.9% FLUSH
10.0000 mL | INTRAVENOUS | Status: DC | PRN
  Administered 2018-03-24: 10 mL via INTRAVENOUS
  Filled 2018-03-24: qty 10

## 2018-03-24 MED ORDER — SODIUM CHLORIDE 0.9 % IV SOLN
Freq: Once | INTRAVENOUS | Status: AC
  Administered 2018-03-24: 09:00:00 via INTRAVENOUS
  Filled 2018-03-24: qty 250

## 2018-03-24 MED ORDER — SODIUM CHLORIDE 0.9% FLUSH
10.0000 mL | INTRAVENOUS | Status: DC | PRN
  Administered 2018-03-24: 10 mL
  Filled 2018-03-24: qty 10

## 2018-03-24 MED ORDER — DIPHENHYDRAMINE HCL 25 MG PO CAPS
ORAL_CAPSULE | ORAL | Status: AC
  Filled 2018-03-24: qty 1

## 2018-03-24 MED ORDER — ACETAMINOPHEN 325 MG PO TABS
650.0000 mg | ORAL_TABLET | Freq: Once | ORAL | Status: AC
  Administered 2018-03-24: 650 mg via ORAL

## 2018-03-24 MED ORDER — PACLITAXEL PROTEIN-BOUND CHEMO INJECTION 100 MG
80.0000 mg/m2 | Freq: Once | INTRAVENOUS | Status: AC
  Administered 2018-03-24: 175 mg via INTRAVENOUS
  Filled 2018-03-24: qty 35

## 2018-03-24 MED ORDER — HEPARIN SOD (PORK) LOCK FLUSH 100 UNIT/ML IV SOLN
500.0000 [IU] | Freq: Once | INTRAVENOUS | Status: AC | PRN
  Administered 2018-03-24: 500 [IU]
  Filled 2018-03-24: qty 5

## 2018-03-24 NOTE — Telephone Encounter (Signed)
Per MD review of creatinine of 1.53 and AST of 79- ok to proceed with Abraxane today.

## 2018-03-24 NOTE — Patient Instructions (Signed)
[facility] Discharge Instructions for Patients Receiving Chemotherapy  Today you received the following chemotherapy agents Kanjinti and Abraxane  To help prevent nausea and vomiting after your treatment, we encourage you to take your nausea medication as directed.   If you develop nausea and vomiting that is not controlled by your nausea medication, call the clinic.   BELOW ARE SYMPTOMS THAT SHOULD BE REPORTED IMMEDIATELY:  *FEVER GREATER THAN 100.5 F  *CHILLS WITH OR WITHOUT FEVER  NAUSEA AND VOMITING THAT IS NOT CONTROLLED WITH YOUR NAUSEA MEDICATION  *UNUSUAL SHORTNESS OF BREATH  *UNUSUAL BRUISING OR BLEEDING  TENDERNESS IN MOUTH AND THROAT WITH OR WITHOUT PRESENCE OF ULCERS  *URINARY PROBLEMS  *BOWEL PROBLEMS  UNUSUAL RASH Items with * indicate a potential emergency and should be followed up as soon as possible.  Feel free to call the clinic you have any questions or concerns. The clinic phone number is [phone].  Please show the [place] at check-in to the Emergency Department and triage nurse.

## 2018-03-25 ENCOUNTER — Ambulatory Visit (INDEPENDENT_AMBULATORY_CARE_PROVIDER_SITE_OTHER): Payer: Medicare Other | Admitting: Family Medicine

## 2018-03-25 ENCOUNTER — Encounter (INDEPENDENT_AMBULATORY_CARE_PROVIDER_SITE_OTHER): Payer: Self-pay | Admitting: Family Medicine

## 2018-03-25 VITALS — BP 122/67 | HR 77 | Ht 69.0 in | Wt 195.0 lb

## 2018-03-25 DIAGNOSIS — Z794 Long term (current) use of insulin: Secondary | ICD-10-CM

## 2018-03-25 DIAGNOSIS — E119 Type 2 diabetes mellitus without complications: Secondary | ICD-10-CM

## 2018-03-25 DIAGNOSIS — F3289 Other specified depressive episodes: Secondary | ICD-10-CM | POA: Diagnosis not present

## 2018-03-25 DIAGNOSIS — E669 Obesity, unspecified: Secondary | ICD-10-CM

## 2018-03-25 DIAGNOSIS — Z6830 Body mass index (BMI) 30.0-30.9, adult: Secondary | ICD-10-CM

## 2018-03-25 MED ORDER — VORTIOXETINE HBR 20 MG PO TABS: 20.0000 mg | ORAL_TABLET | Freq: Every day | ORAL | 0 refills | Status: DC

## 2018-03-25 MED ORDER — METFORMIN HCL 500 MG PO TABS: 500.0000 mg | ORAL_TABLET | Freq: Every day | ORAL | 0 refills | Status: DC

## 2018-03-25 MED ORDER — BUPROPION HCL ER (SR) 200 MG PO TB12: ORAL_TABLET | ORAL | 0 refills | Status: DC

## 2018-03-25 MED ORDER — INSULIN GLARGINE 100 UNIT/ML SOLOSTAR PEN: 12.0000 [IU] | PEN_INJECTOR | Freq: Every day | SUBCUTANEOUS | 0 refills | Status: DC

## 2018-03-25 MED ORDER — LIRAGLUTIDE 18 MG/3ML ~~LOC~~ SOPN: PEN_INJECTOR | SUBCUTANEOUS | 0 refills | Status: DC

## 2018-03-26 NOTE — Progress Notes (Signed)
Office: [phone]  /  Fax: [phone]   HPI:   Chief Complaint: OBESITY Darlene Darlene [name] is here to discuss her progress with her obesity treatment plan. She is on the keep a food journal with 1300-1500 calories and 80 grams of protein daily and is following her eating plan approximately 60 % of the time. She states she is exercising 0 minutes 0 times per week. Darlene Darlene [name] continues to maintain her weight while going through her Cancer treatment. Her appetite is still good and she is working on increasing fruit and vegetables, and being mindful of her food choices.  Her weight is 195 lb (88.5 kg) today and has not lost weight since her last visit. She has lost 30 lbs since starting treatment with [name].  Diabetes II Darlene Darlene [name] has a diagnosis of diabetes type II. Darlene Darlene [name] states she is not checking her BGs at home and she denies hypoglycemia. She has been working on intensive lifestyle modifications including diet, exercise, and weight loss to help control her blood glucose levels.  Depression with emotional eating behaviors Darlene Darlene [name] states her mood is good and her energy is good overall. She feels hopeful about her future and is sleeping well on her medications. Darlene Darlene [name] struggles with emotional eating and using food for comfort to the extent that it is negatively impacting her health. She often snacks when she is not hungry. Darlene Darlene [name] sometimes feels she is out of control and then feels guilty that she made poor food choices. She has been working on behavior modification techniques to help reduce her emotional eating and has been somewhat successful. She shows no sign of suicidal or homicidal ideations.  Depression screen [facility] 2/9 09/12/2016 Darlene [name]  Decreased Interest 0 1  Down, Depressed, Hopeless 0 1  PHQ - 2 Score 0 2  Altered sleeping - 0  Tired, decreased energy - 1  Change in appetite - 2  Feeling bad or failure about yourself  - 2  Trouble concentrating - 1  Moving slowly or  fidgety/restless - 1  Suicidal thoughts - 0  PHQ-9 Score - 9  Some recent data might be hidden    ASSESSMENT AND PLAN:  Type 2 diabetes mellitus without complication, with long-term current use of insulin (HCC) - Plan: Insulin Glargine (LANTUS SOLOSTAR) 100 UNIT/ML Solostar Pen, metFORMIN (GLUCOPHAGE) 500 MG tablet, liraglutide (VICTOZA) 18 MG/3ML SOPN  Other depression - with emotional eating - Plan: buPROPion (WELLBUTRIN SR) 200 MG 12 hr tablet, vortioxetine HBr (TRINTELLIX) 20 MG TABS tablet  Class 1 obesity with serious comorbidity and body mass index (BMI) of 30.0 to 30.9 in adult, unspecified obesity type - Starting BMI greater then 30  PLAN:  Diabetes II Darlene Darlene [name] has been given extensive diabetes education by myself today including ideal fasting and post-prandial blood glucose readings, individual ideal Hgb A1c goals and hypoglycemia prevention. We discussed the importance of good blood sugar control to decrease the likelihood of diabetic complications such as nephropathy, neuropathy, limb loss, blindness, coronary artery disease, and death. We discussed the importance of intensive lifestyle modification including diet, exercise and weight loss as the first line treatment for diabetes. Darlene [name] agrees to continue Lantus 12 units SubQ daily #5 pens and we will refill for 1 month, she agrees to continue Victoza 1.8 mg SubQ daily #3 pens and we will refill for 1 month. She agrees to continue taking metformin 500 mg qd #30 and we will refill for 1 month. Darlene Darlene [name] agrees to follow up with our clinic in 3 to 4 weeks.  Depression with Emotional Eating Behaviors We discussed behavior modification techniques today to help Darlene Darlene [name] deal with her emotional eating and depression. Darlene Darlene [name] agrees to continue taking Wellbutrin SR 200 mg qd #30 and we will refill for 1 month, and she agrees to continue taking Trintellix 20 mg qhs #30 and we will refill for 1 month. Darlene Darlene [name] agrees to follow up with our  clinic in 3 to 4 weeks.  Obesity Darlene Darlene [name] is currently in the action stage of change. As such, her goal is to maintain weight for now  Darlene Darlene [name]'s goal is to maintain weight while treating her Cancer. She has agreed to follow the keep a food journal with 1300-1500 calories and 80 grams of protein daily Darlene Darlene [name] has been instructed to work up to a goal of 150 minutes of combined cardio and strengthening exercise per week for weight loss and overall health benefits. We discussed the following Behavioral Modification Strategies today: increasing lean protein intake, decreasing simple carbohydrates, and meal planning & cooking strategies   Darlene Darlene [name] has agreed to follow up with our clinic in 3 to 4 weeks. She was informed of the importance of frequent follow up visits to maximize her success with intensive lifestyle modifications for her multiple health conditions.  ALLERGIES: Allergies  Allergen Reactions  . Nsaids Other (See Comments)    H/o gastric bypass - avoid NSAIDs  . Tape Hives, Rash and Other (See Comments)    Adhesives in bandaids    MEDICATIONS: Current Outpatient Medications on File Prior to Visit  Medication Sig Dispense Refill  . acetaminophen (TYLENOL) 325 MG tablet Take 650 mg by mouth 2 (two) times daily as needed for moderate pain or headache.     [name] CALCIUM CITRATE-VITAMIN D PO Take 1 tablet by mouth 3 (three) times daily.     . cholecalciferol (VITAMIN D) 1000 units tablet Take 1,000 Units by mouth daily.    . Cyanocobalamin (VITAMIN B-12 PO) Take 1 tablet by mouth every 14 (fourteen) days.    . diphenhydrAMINE (BENADRYL) 25 MG tablet Take 25 mg by mouth daily as needed for allergies or sleep.     . Insulin Pen Needle (BD PEN NEEDLE NANO U/F) 32G X 4 MM MISC 1 Package by Does not apply route daily at 12 noon. 100 each 0  . loratadine (CLARITIN) 10 MG tablet Take 10 mg by mouth as needed for allergies.    [name] losartan (COZAAR) 100 MG tablet Take 1 tablet (100 mg total) by  mouth daily. 30 tablet 0  . Melatonin 3 MG CAPS Take 3 mg by mouth at bedtime.     Glory Rosebush DELICA LANCETS 86V MISC 1 Package by Does not apply route daily. 100 each 0  . Pediatric Multivitamins-Iron (FLINTSTONES PLUS IRON) chewable tablet Chew 1 tablet by mouth 2 (two) times daily.    . Probiotic Product ([employer]S PROBIOTIC PO) Take 1 capsule by mouth daily.     . prochlorperazine (COMPAZINE) 10 MG tablet Take 1 tablet (10 mg total) by mouth every 6 (six) hours as needed for nausea or vomiting. 30 tablet 0  . Propylene Glycol (SYSTANE BALANCE OP) Place 1-2 drops into both eyes 3 (three) times daily as needed (for dry eyes).     [name] senna-docusate (SENNA PLUS) 8.6-50 MG tablet Take 1 tablet by mouth daily as needed for moderate constipation.     . topiramate (TOPAMAX) 25 MG tablet Take 1 tablet (25 mg total) by mouth daily. 30 tablet 0   No current facility-administered medications  on file prior to visit.     PAST MEDICAL HISTORY: Past Medical History:  Diagnosis Date  . Anemia    hx of - during 1st round chemo  . Anxiety   . Arthritis    in fingers, shoulders  . Back pain   . Balance problem   . Breast cancer ([place])   . Breast cancer of upper-outer quadrant of right female breast ([place]) 07/22/2014  . Bunion, right foot   . Clumsiness   . Constipation   . Depression   . Diabetes mellitus without complication ([place])    17+ years  . Eating disorder    binge eating  . Epistaxis 08/26/2014  . Fatty liver   . HCAP (healthcare-associated pneumonia) 12/18/2014  . Headache   . Heart murmur    never had any problems  . History of radiation therapy 04/05/15-05/19/15   right chest wall was treated to 50.4 Gy in 28 fractions, right mastectomy scar was treated to 10 Gy in 5 fractions  . Hyperlipidemia   . Hypertension   . Joint pain   . Multiple food allergies   . Neuromuscular disorder ([place])    diabetic neuropathy  . Obesity   . Obstructive sleep apnea on CPAP    uses cpap nightly  . Ovarian  cyst rupture    possible  . Pneumonia   . Pneumonia 11/2014  . Radiation 04/05/15-05/19/15   right chest wall 50.4 Gy, mastectomy scar 10 Gy  . Sleep apnea   . Thyroid nodule 06/2014    PAST SURGICAL HISTORY: Past Surgical History:  Procedure Laterality Date  . APPENDECTOMY    . BIOPSY THYROID Bilateral 06/2014   2 nodules - Benign   . BREAST LUMPECTOMY WITH RADIOACTIVE SEED AND SENTINEL LYMPH NODE BIOPSY Right 12/27/2014   Procedure: BREAST LUMPECTOMY WITH RADIOACTIVE SEED AND SENTINEL LYMPH NODE BIOPSY;  Surgeon: Stark Klein, MD;  Location: [place];  Service: General;  Laterality: Right;  . BREAST REDUCTION SURGERY Bilateral 01/06/2015   Procedure: BILATERAL BREAST REDUCTION, RIGHT ONCOPLASTIC RECONSTRUCTION),LEFT BREAST REDUCTION FOR SYMMETRY;  Surgeon: [name], MD;  Location: [place];  Service: Plastics;  Laterality: Bilateral;  . BREATH TEK H PYLORI N/A 09/15/2012   Procedure: BREATH TEK H PYLORI;  Surgeon: Pedro Earls, MD;  Location: [facility];  Service: General;  Laterality: N/A;  . BUNIONECTOMY Left 12/2013  . CARPAL TUNNEL RELEASE Right   . CARPAL TUNNEL RELEASE Left   . CATARACT EXTRACTION     6/19  . COLONOSCOPY    . DUPUYTREN CONTRACTURE RELEASE    . GASTRIC ROUX-EN-Y N/A 11/02/2012   Procedure: LAPAROSCOPIC ROUX-EN-Y GASTRIC;  Surgeon: Pedro Earls, MD;  Location: [facility];  Service: General;  Laterality: N/A;  . IR GENERIC HISTORICAL  03/18/2016   IR US GUIDE VASC ACCESS LEFT 03/18/2016 Markus Daft, MD [facility]  . IR GENERIC HISTORICAL  03/18/2016   IR FLUORO GUIDE CV LINE LEFT 03/18/2016 Markus Daft, MD [facility]  . LAPAROSCOPIC APPENDECTOMY N/A 07/22/2015   Procedure: APPENDECTOMY LAPAROSCOPIC;  Surgeon: Michael Boston, MD;  Location: [facility];  Service: General;  Laterality: N/A;  . MASTECTOMY Right 02/15/2015   modified  . MODIFIED MASTECTOMY Right 02/15/2015   Procedure: RIGHT MODIFIED RADICAL MASTECTOMY;  Surgeon: Stark Klein, MD;   Location: [place];  Service: General;  Laterality: Right;  . PORT-A-CATH REMOVAL Left 10/10/2015   Procedure: PORT REMOVAL;  Surgeon: Stark Klein, MD;  Location: [place];  Service: General;  Laterality: Left;  . PORTACATH PLACEMENT Left 08/02/2014   Procedure: INSERTION PORT-A-CATH;  Surgeon: Stark Klein, MD;  Location: [place];  Service: General;  Laterality: Left;  . PORTACATH PLACEMENT N/A 11/05/2016   Procedure: INSERTION PORT-A-CATH;  Surgeon: Stark Klein, MD;  Location: [place];  Service: General;  Laterality: N/A;  . ROTATOR CUFF REPAIR Right 2005  . SCAR REVISION Right 12/08/2015   Procedure: COMPLEX REPAIR RIGHT CHEST 10-15CM;  Surgeon: [name], MD;  Location: [place];  Service: Plastics;  Laterality: Right;  . TOE SURGERY  1970s   bone spur  . TRIGGER FINGER RELEASE     x3    SOCIAL HISTORY: Social History   Tobacco Use  . Smoking status: Never Smoker  . Smokeless tobacco: Never Used  Substance Use Topics  . Alcohol use: Yes    Alcohol/week: 1.0 standard drinks    Types: 1 Standard drinks or equivalent per week    Comment: social  . Drug use: No    FAMILY HISTORY: Family History  Problem Relation Age of Onset  . CVA Mother   . Heart disease Mother   . Sudden death Mother   . Diabetes Father   . Heart failure Father   . Hypertension Father   . Kidney disease Father   . Obesity Father   . Hypertension Sister   . Diabetes Brother   . Breast cancer Paternal Grandmother 75  . Diabetes Paternal Uncle   . Ovarian cancer Neg Hx     ROS: Review of Systems  Constitutional: Negative for weight loss.  Endo/Heme/Allergies:       Negative hypoglycemia  Psychiatric/Behavioral: Positive for depression. Negative for suicidal ideas.    PHYSICAL EXAM: Blood pressure 122/67, pulse 77, height 5\' 9"  (1.753 m), weight 195 lb (88.5 kg), last menstrual period 10/22/2007, SpO2 97 %. Body mass index is 28.8 kg/m. Physical  Exam Vitals signs reviewed.  Constitutional:      Appearance: Normal appearance. She is obese.  Cardiovascular:     Rate and Rhythm: Normal rate.     Pulses: Normal pulses.  Pulmonary:     Effort: Pulmonary effort is normal.     Breath sounds: Normal breath sounds.  Musculoskeletal: Normal range of motion.  Skin:    General: Skin is warm and dry.  Neurological:     Mental Status: She is alert and oriented to person, place, and time.  Psychiatric:        Mood and Affect: Mood normal.        Behavior: Behavior normal.     RECENT LABS AND TESTS: BMET    Component Value Date/Time   NA 143 03/24/2018 0825   NA 143 03/11/2017 0841   NA 140 01/07/2017 0758   K 4.0 03/24/2018 0825   K 4.1 01/07/2017 0758   CL 113 (H) 03/24/2018 0825   CO2 22 03/24/2018 0825   CO2 24 01/07/2017 0758   GLUCOSE 125 (H) 03/24/2018 0825   GLUCOSE 134 01/07/2017 0758   BUN 25 (H) 03/24/2018 0825   BUN 40 (H) 03/11/2017 0841   BUN 33.6 (H) 01/07/2017 0758   CREATININE 1.53 (H) 03/24/2018 0825   CREATININE 1.74 (H) 12/16/2017 0806   CREATININE 1.5 (H) 01/07/2017 0758   CALCIUM 8.7 (L) 03/24/2018 0825   CALCIUM 9.3 01/07/2017 0758   GFRNONAA 35 (L) 03/24/2018 0825   GFRNONAA 30 (L) 12/16/2017 0806   GFRNONAA 55 (L) 02/18/2013 0827   GFRAA 41 (L)  03/24/2018 0825   GFRAA 35 (L) 12/16/2017 0806   GFRAA 63 02/18/2013 0827   Lab Results  Component Value Date   HGBA1C 5.8 (H) 09/03/2017   HGBA1C 6.1 (H) 03/11/2017   HGBA1C 8.2 (H) 11/05/2016   HGBA1C 8.1 (H) 08/09/2016   HGBA1C 6.5 (H) 01/03/2015   Lab Results  Component Value Date   INSULIN 15.3 03/11/2017   INSULIN 18.3 08/09/2016   CBC    Component Value Date/Time   WBC 3.1 (L) 03/24/2018 0825   RBC 2.96 (L) 03/24/2018 0825   HGB 9.4 (L) 03/24/2018 0825   HGB 10.6 (L) 12/16/2017 0806   HGB 10.8 (L) 01/07/2017 0758   HCT 28.8 (L) 03/24/2018 0825   HCT 31.9 (L) 01/07/2017 0758   PLT 140 (L) 03/24/2018 0825   PLT 83 (L) 12/16/2017  0806   PLT 126 (L) 01/07/2017 0758   MCV 97.3 03/24/2018 0825   MCV 90.8 01/07/2017 0758   MCH 31.8 03/24/2018 0825   MCHC 32.6 03/24/2018 0825   RDW 15.9 (H) 03/24/2018 0825   RDW 14.8 (H) 01/07/2017 0758   LYMPHSABS 1.0 03/24/2018 0825   LYMPHSABS 1.1 01/07/2017 0758   MONOABS 0.4 03/24/2018 0825   MONOABS 0.3 01/07/2017 0758   EOSABS 0.1 03/24/2018 0825   EOSABS 0.1 01/07/2017 0758   EOSABS 0.1 08/09/2016 1229   BASOSABS 0.0 03/24/2018 0825   BASOSABS 0.0 01/07/2017 0758   Iron/TIBC/Ferritin/ %Sat    Component Value Date/Time   IRON 58 02/18/2013 0827   TIBC 305 02/18/2013 0827   FERRITIN 133 10/31/2014 0816   IRONPCTSAT 19 (L) 02/18/2013 0827   Lipid Panel     Component Value Date/Time   CHOL 160 09/03/2017 1349   TRIG 111 09/03/2017 1349   HDL 59 09/03/2017 1349   CHOLHDL 2.9 02/18/2013 0827   VLDL 23 02/18/2013 0827   LDLCALC 79 09/03/2017 1349   Hepatic Function Panel     Component Value Date/Time   PROT 6.2 (L) 03/24/2018 0825   PROT 6.9 03/11/2017 0841   PROT 6.7 01/07/2017 0758   ALBUMIN 3.6 03/24/2018 0825   ALBUMIN 4.5 03/11/2017 0841   ALBUMIN 3.6 01/07/2017 0758   AST 61 (H) 03/24/2018 0825   AST 43 (H) 12/16/2017 0806   AST 51 (H) 01/07/2017 0758   ALT 79 (H) 03/24/2018 0825   ALT 57 (H) 12/16/2017 0806   ALT 82 (H) 01/07/2017 0758   ALKPHOS 130 (H) 03/24/2018 0825   ALKPHOS 115 01/07/2017 0758   BILITOT 0.6 03/24/2018 0825   BILITOT 0.6 12/16/2017 0806   BILITOT 0.72 01/07/2017 0758      Component Value Date/Time   TSH 1.54 02/07/2017   TSH 2.170 08/09/2016 1229      OBESITY BEHAVIORAL INTERVENTION VISIT  Today's visit was # 34  Starting weight: 225 lbs Starting date: 08/09/16 Today's weight : 195 lbs Today's date: 03/25/2018 Total lbs lost to date: 30 At least 15 minutes were spent on discussing the following behavioral intervention visit.    03/25/2018  Height 5\' 9"  (1.753 m)  Weight 195 lb (88.5 kg)  BMI (Calculated) 28.78   BLOOD PRESSURE - SYSTOLIC 528  BLOOD PRESSURE - DIASTOLIC 67   Body Fat % 41.3 %  Total Body Water (lbs) 80.6 lbs    ASK: We discussed the diagnosis of obesity with [name] today and Mahika agreed to give [name] permission to discuss obesity behavioral modification therapy today.  ASSESS: Breaunna has the diagnosis of obesity  and her BMI today is 28.78 Porschea is in the action stage of change   ADVISE: Daisia was educated on the multiple health risks of obesity as well as the benefit of weight loss to improve her health. She was advised of the need for long term treatment and the importance of lifestyle modifications to improve her current health and to decrease her risk of future health problems.  AGREE: Multiple dietary modification options and treatment options were discussed and  [place] agreed to follow the recommendations documented in the above note.  ARRANGE: Porche was educated on the importance of frequent visits to treat obesity as outlined per CMS and USPSTF guidelines and agreed to schedule her next follow up appointment today.  I, [name], am acting as transcriptionist for Dennard Nip, MD   I have reviewed the above documentation for accuracy and completeness, and I agree with the above. -Dennard Nip, MD

## 2018-04-02 ENCOUNTER — Telehealth (INDEPENDENT_AMBULATORY_CARE_PROVIDER_SITE_OTHER): Payer: Self-pay | Admitting: Family Medicine

## 2018-04-02 ENCOUNTER — Other Ambulatory Visit (INDEPENDENT_AMBULATORY_CARE_PROVIDER_SITE_OTHER): Payer: Self-pay | Admitting: Family Medicine

## 2018-04-02 ENCOUNTER — Other Ambulatory Visit (INDEPENDENT_AMBULATORY_CARE_PROVIDER_SITE_OTHER): Payer: Self-pay

## 2018-04-02 DIAGNOSIS — E119 Type 2 diabetes mellitus without complications: Secondary | ICD-10-CM

## 2018-04-02 DIAGNOSIS — F418 Other specified anxiety disorders: Secondary | ICD-10-CM

## 2018-04-02 DIAGNOSIS — Z794 Long term (current) use of insulin: Secondary | ICD-10-CM

## 2018-04-02 MED ORDER — INSULIN GLARGINE 100 UNIT/ML SOLOSTAR PEN: 12.0000 [IU] | PEN_INJECTOR | Freq: Every day | SUBCUTANEOUS | 0 refills | Status: DC

## 2018-04-02 NOTE — Telephone Encounter (Signed)
Patient states Dr. Leafy Ro was going to renew her prescriptions with [facility] last week.  The pharmacy has told her they have not received anything.  [name] needs Tatiranate and Lantus filled right away.  She states she will need the rest of her prescriptions refilled by 3/24. Thank you

## 2018-04-06 ENCOUNTER — Other Ambulatory Visit: Payer: Self-pay | Admitting: *Deleted

## 2018-04-07 ENCOUNTER — Inpatient Hospital Stay (HOSPITAL_BASED_OUTPATIENT_CLINIC_OR_DEPARTMENT_OTHER): Payer: Medicare Other | Admitting: Adult Health

## 2018-04-07 ENCOUNTER — Inpatient Hospital Stay: Payer: Medicare Other

## 2018-04-07 ENCOUNTER — Other Ambulatory Visit (INDEPENDENT_AMBULATORY_CARE_PROVIDER_SITE_OTHER): Payer: Self-pay | Admitting: Family Medicine

## 2018-04-07 ENCOUNTER — Encounter (INDEPENDENT_AMBULATORY_CARE_PROVIDER_SITE_OTHER): Payer: Self-pay | Admitting: Family Medicine

## 2018-04-07 ENCOUNTER — Encounter: Payer: Self-pay | Admitting: Adult Health

## 2018-04-07 ENCOUNTER — Other Ambulatory Visit (INDEPENDENT_AMBULATORY_CARE_PROVIDER_SITE_OTHER): Payer: Self-pay

## 2018-04-07 ENCOUNTER — Other Ambulatory Visit: Payer: Self-pay

## 2018-04-07 VITALS — BP 129/51 | HR 62 | Temp 97.6°F | Resp 18 | Ht 69.0 in | Wt 197.6 lb

## 2018-04-07 DIAGNOSIS — C50411 Malignant neoplasm of upper-outer quadrant of right female breast: Secondary | ICD-10-CM

## 2018-04-07 DIAGNOSIS — Z171 Estrogen receptor negative status [ER-]: Secondary | ICD-10-CM

## 2018-04-07 DIAGNOSIS — C787 Secondary malignant neoplasm of liver and intrahepatic bile duct: Secondary | ICD-10-CM | POA: Diagnosis not present

## 2018-04-07 DIAGNOSIS — Z95828 Presence of other vascular implants and grafts: Secondary | ICD-10-CM

## 2018-04-07 DIAGNOSIS — Z5112 Encounter for antineoplastic immunotherapy: Secondary | ICD-10-CM | POA: Diagnosis not present

## 2018-04-07 DIAGNOSIS — I10 Essential (primary) hypertension: Secondary | ICD-10-CM

## 2018-04-07 DIAGNOSIS — C801 Malignant (primary) neoplasm, unspecified: Secondary | ICD-10-CM

## 2018-04-07 DIAGNOSIS — C50911 Malignant neoplasm of unspecified site of right female breast: Secondary | ICD-10-CM

## 2018-04-07 LAB — CBC WITH DIFFERENTIAL/PLATELET
Abs Immature Granulocytes: 0.01 10*3/uL (ref 0.00–0.07)
Basophils Absolute: 0 10*3/uL (ref 0.0–0.1)
Basophils Relative: 1 %
Eosinophils Absolute: 0.1 10*3/uL (ref 0.0–0.5)
Eosinophils Relative: 4 %
HCT: 30.1 % — ABNORMAL LOW (ref 36.0–46.0)
Hemoglobin: 9.8 g/dL — ABNORMAL LOW (ref 12.0–15.0)
Immature Granulocytes: 0 %
Lymphocytes Relative: 19 %
Lymphs Abs: 0.7 10*3/uL (ref 0.7–4.0)
MCH: 32.1 pg (ref 26.0–34.0)
MCHC: 32.6 g/dL (ref 30.0–36.0)
MCV: 98.7 fL (ref 80.0–100.0)
Monocytes Absolute: 0.3 10*3/uL (ref 0.1–1.0)
Monocytes Relative: 8 %
Neutro Abs: 2.6 10*3/uL (ref 1.7–7.7)
Neutrophils Relative %: 68 %
Platelets: 136 10*3/uL — ABNORMAL LOW (ref 150–400)
RBC: 3.05 MIL/uL — ABNORMAL LOW (ref 3.87–5.11)
RDW: 14.8 % (ref 11.5–15.5)
WBC: 3.8 10*3/uL — ABNORMAL LOW (ref 4.0–10.5)
nRBC: 0 % (ref 0.0–0.2)

## 2018-04-07 LAB — COMPREHENSIVE METABOLIC PANEL
ALT: 80 U/L — ABNORMAL HIGH (ref 0–44)
AST: 50 U/L — ABNORMAL HIGH (ref 15–41)
Albumin: 3.7 g/dL (ref 3.5–5.0)
Alkaline Phosphatase: 141 U/L — ABNORMAL HIGH (ref 38–126)
Anion gap: 10 (ref 5–15)
BUN: 32 mg/dL — ABNORMAL HIGH (ref 8–23)
CO2: 20 mmol/L — ABNORMAL LOW (ref 22–32)
Calcium: 8.8 mg/dL — ABNORMAL LOW (ref 8.9–10.3)
Chloride: 112 mmol/L — ABNORMAL HIGH (ref 98–111)
Creatinine, Ser: 1.79 mg/dL — ABNORMAL HIGH (ref 0.44–1.00)
GFR calc Af Amer: 34 mL/min — ABNORMAL LOW (ref >60)
GFR calc non Af Amer: 29 mL/min — ABNORMAL LOW (ref >60)
Glucose, Bld: 151 mg/dL — ABNORMAL HIGH (ref 70–99)
Potassium: 4.1 mmol/L (ref 3.5–5.1)
Sodium: 142 mmol/L (ref 135–145)
Total Bilirubin: 0.7 mg/dL (ref 0.3–1.2)
Total Protein: 6.5 g/dL (ref 6.5–8.1)

## 2018-04-07 MED ORDER — LOSARTAN POTASSIUM 100 MG PO TABS: 100.0000 mg | ORAL_TABLET | Freq: Every day | ORAL | 0 refills | Status: DC

## 2018-04-07 MED ORDER — PROCHLORPERAZINE MALEATE 10 MG PO TABS
10.0000 mg | ORAL_TABLET | Freq: Once | ORAL | Status: AC
  Administered 2018-04-07: 10 mg via ORAL

## 2018-04-07 MED ORDER — PACLITAXEL PROTEIN-BOUND CHEMO INJECTION 100 MG
80.0000 mg/m2 | Freq: Once | INTRAVENOUS | Status: AC
  Administered 2018-04-07: 175 mg via INTRAVENOUS
  Filled 2018-04-07: qty 35

## 2018-04-07 MED ORDER — SODIUM CHLORIDE 0.9% FLUSH
10.0000 mL | INTRAVENOUS | Status: DC | PRN
  Administered 2018-04-07: 10 mL via INTRAVENOUS
  Filled 2018-04-07: qty 10

## 2018-04-07 MED ORDER — PROCHLORPERAZINE MALEATE 10 MG PO TABS
ORAL_TABLET | ORAL | Status: AC
  Filled 2018-04-07: qty 1

## 2018-04-07 MED ORDER — SODIUM CHLORIDE 0.9% FLUSH
10.0000 mL | INTRAVENOUS | Status: DC | PRN
  Administered 2018-04-07: 10 mL
  Filled 2018-04-07: qty 10

## 2018-04-07 MED ORDER — SODIUM CHLORIDE 0.9 % IV SOLN
Freq: Once | INTRAVENOUS | Status: AC
  Administered 2018-04-07: 10:00:00 via INTRAVENOUS
  Filled 2018-04-07: qty 250

## 2018-04-07 MED ORDER — HEPARIN SOD (PORK) LOCK FLUSH 100 UNIT/ML IV SOLN
500.0000 [IU] | Freq: Once | INTRAVENOUS | Status: AC | PRN
  Administered 2018-04-07: 500 [IU]
  Filled 2018-04-07: qty 5

## 2018-04-07 NOTE — Progress Notes (Signed)
[place]  [phone] [phone]   ID: Darlene [name] DOB: October 28, 1950  MR#: [id number]  FGH#:[id number]  Patient Care Team: Vernie Shanks, MD as PCP - General (Family Medicine) Himmelrich, Bryson Ha, RD (Inactive) as Dietitian (Bariatrics) Stark Klein, MD as Consulting Physician (General Surgery) Magrinat, Virgie Dad, MD as Consulting Physician (Oncology) Arloa Koh, MD as Consulting Physician (Radiation Oncology) Mauro Kaufmann, RN as Registered Nurse Rockwell Germany, RN as Registered Nurse Jacelyn Pi, MD as Consulting Physician (Endocrinology) Kem Boroughs, [place] as Nurse Practitioner (Family Medicine) [name], MD as Consulting Physician (Plastic Surgery) Larey Dresser, MD as Consulting Physician (Cardiology) Gery Pray, MD as Consulting Physician (Radiation Oncology) Juanita Craver, MD as Consulting Physician (Gastroenterology)  OTHER MD:   CHIEF COMPLAINT:  HER-2 positive, estrogen receptor negative locally recurrent disease  CURRENT TREATMENT:  Trastuzumab; Abraxane   INTERVAL HISTORY:  Darlene [name] returns today for follow-up and treatment of her estrogen receptor negative but HER-2 amplified breast cancer. She is accompanied by her husband.  She continues on trastuzumab.  She tolerates this with no side effects that she is aware of. She receives this every 3 weeks as opposed to every 4 so she doesn't have to come during her off week and to avoid appointment confusion regarding her different treatments.    She also continues on abraxane, receiving this on days 1 and 8 of a 21 day cycle.  Today is cycle 6 day 1.  Darlene [name]'s last echocardiogram  Was on 03/06/2018 showing an EF of 55-60%.    Her most recent imaging was liver MRI with and without contrast on 02/24/2018 showing Interval response to therapy. The right lobe of liver lesion has decreased in size in the interval. Mild porta hepatic adenopathy is stable to  slightly improved in the interval. Left adrenal gland adenoma.  She has PET scan scheduled in April, 2020.   REVIEW OF SYSTEMS: Darlene [name] is feeling well today.  She remains active.  She has no peripheral neuropathy or new pain.  She denies any fevers, chills, cough, shortness of breath, chest pain, or palpitations.  She is without nausea, vomiting, bowel/bladder changes, dysphagia, mucositis.  She notes her hair is growing back.  Her appetite is unchanged and her weight is stable.  A detailed ROS was otherwise non contributory.   BREAST CANCER HISTORY: From the original intake note:  "Darlene [name]" had screening mammography at her gynecologist suggestive of a possible abnormality in the right breast. However right diagnostic mammogram 04/12/2013 at [facility] was negative. More recently however, she noted a lump in her right breast and brought it to her gynecologist's attention. On 07/18/2014 the patient had bilateral diagnostic mammography with tomosynthesis at [facility]. The breast density was category B. In the right breast upper outer quadrant there was an area of focal asymmetry with indistinct margins.[name] Ultrasound was obtained and showed in addition to the mass in question, measuring 1.7 cm, an abnormal-appearing lymph node in the right axillary tail.  On 07/19/2014 the patient underwent biopsy of the right breast massin question as well as a right axillary lymph node. Both were positive for invasive ductal carcinoma, grade 2, estrogen and progesterone receptor negative, with an MIB-1 of 90%, and HER-2 pending. Incidentally the lymph node biopsy showed a lymphocytic inflammatory component but no lymph node architecture.  Her subsequent history is as detailed below.   PAST MEDICAL HISTORY: Past Medical History:  Diagnosis Date  . Anemia    hx of -  during 1st round chemo  . Anxiety   . Arthritis    in fingers, shoulders  . Back pain   . Balance problem   . Breast cancer ([place])   . Breast cancer of  upper-outer quadrant of right female breast ([place]) 07/22/2014  . Bunion, right foot   . Clumsiness   . Constipation   . Depression   . Diabetes mellitus without complication ([place])    21+ years  . Eating disorder    binge eating  . Epistaxis 08/26/2014  . Fatty liver   . HCAP (healthcare-associated pneumonia) 12/18/2014  . Headache   . Heart murmur    never had any problems  . History of radiation therapy 04/05/15-05/19/15   right chest wall was treated to 50.4 Gy in 28 fractions, right mastectomy scar was treated to 10 Gy in 5 fractions  . Hyperlipidemia   . Hypertension   . Joint pain   . Multiple food allergies   . Neuromuscular disorder ([place])    diabetic neuropathy  . Obesity   . Obstructive sleep apnea on CPAP    uses cpap nightly  . Ovarian cyst rupture    possible  . Pneumonia   . Pneumonia 11/2014  . Radiation 04/05/15-05/19/15   right chest wall 50.4 Gy, mastectomy scar 10 Gy  . Sleep apnea   . Thyroid nodule 06/2014    PAST SURGICAL HISTORY: Past Surgical History:  Procedure Laterality Date  . APPENDECTOMY    . BIOPSY THYROID Bilateral 06/2014   2 nodules - Benign   . BREAST LUMPECTOMY WITH RADIOACTIVE SEED AND SENTINEL LYMPH NODE BIOPSY Right 12/27/2014   Procedure: BREAST LUMPECTOMY WITH RADIOACTIVE SEED AND SENTINEL LYMPH NODE BIOPSY;  Surgeon: Stark Klein, MD;  Location: [facility];  Service: General;  Laterality: Right;  . BREAST REDUCTION SURGERY Bilateral 01/06/2015   Procedure: BILATERAL BREAST REDUCTION, RIGHT ONCOPLASTIC RECONSTRUCTION),LEFT BREAST REDUCTION FOR SYMMETRY;  Surgeon: [name], MD;  Location: [facility];  Service: Plastics;  Laterality: Bilateral;  . BREATH TEK H PYLORI N/A 09/15/2012   Procedure: BREATH TEK H PYLORI;  Surgeon: Pedro Earls, MD;  Location: [facility];  Service: General;  Laterality: N/A;  . BUNIONECTOMY Left 12/2013  . CARPAL TUNNEL RELEASE Right   . CARPAL TUNNEL RELEASE Left   . CATARACT EXTRACTION      6/19  . COLONOSCOPY    . DUPUYTREN CONTRACTURE RELEASE    . GASTRIC ROUX-EN-Y N/A 11/02/2012   Procedure: LAPAROSCOPIC ROUX-EN-Y GASTRIC;  Surgeon: Pedro Earls, MD;  Location: [facility];  Service: General;  Laterality: N/A;  . IR GENERIC HISTORICAL  03/18/2016   IR US GUIDE VASC ACCESS LEFT 03/18/2016 Markus Daft, MD [facility]  . IR GENERIC HISTORICAL  03/18/2016   IR FLUORO GUIDE CV LINE LEFT 03/18/2016 Markus Daft, MD [facility]  . LAPAROSCOPIC APPENDECTOMY N/A 07/22/2015   Procedure: APPENDECTOMY LAPAROSCOPIC;  Surgeon: Michael Boston, MD;  Location[facility];  Service: General;  Laterality: N/A;  . MASTECTOMY Right 02/15/2015   modified  . MODIFIED MASTECTOMY Right 02/15/2015   Procedure: RIGHT MODIFIED RADICAL MASTECTOMY;  Surgeon: Stark Klein, MD;  Location: [facility];  Service: General;  Laterality: Right;  . PORT-A-CATH REMOVAL Left 10/10/2015   Procedure: PORT REMOVAL;  Surgeon: Stark Klein, MD;  Location: [facility];  Service: General;  Laterality: Left;  . PORTACATH PLACEMENT Left 08/02/2014   Procedure: INSERTION PORT-A-CATH;  Surgeon: Stark Klein, MD;  Location: [facility];  Service: General;  Laterality: Left;  . PORTACATH PLACEMENT N/A 11/05/2016   Procedure: INSERTION PORT-A-CATH;  Surgeon: Stark Klein, MD;  Location: [facility];  Service: General;  Laterality: N/A;  . ROTATOR CUFF REPAIR Right 2005  . SCAR REVISION Right 12/08/2015   Procedure: COMPLEX REPAIR RIGHT CHEST 10-15CM;  Surgeon: [name], MD;  Location: [facility];  Service: Plastics;  Laterality: Right;  . TOE SURGERY  1970s   bone spur  . TRIGGER FINGER RELEASE     x3    FAMILY HISTORY Family History  Problem Relation Age of Onset  . CVA Mother   . Heart disease Mother   . Sudden death Mother   . Diabetes Father   . Heart failure Father   . Hypertension Father   . Kidney disease Father   . Obesity Father   . Hypertension Sister   . Diabetes Brother   . Breast cancer  Paternal Grandmother 16  . Diabetes Paternal Uncle   . Ovarian cancer Neg Hx    The patient's father died from complications of diabetes at age 81. The patient's mother died at age 63 with a subarachnoid hemorrhage. The patient had one brother, one sister. The only breast cancer was the patient's paternal grandmother diagnosed in her 26s. There is no history of ovarian cancer in the family.   GYNECOLOGIC HISTORY:  Patient's last menstrual period was 10/22/2007.  menarche age 80, the patient is GX P0. She went through the change of life in 2011. She did not take hormone replacement.   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 Her husband [name] is a retired Visual merchandiser (he taught at [employer]).  [name] has 2 children from an earlier marriage, Rockey Situ who teaches in [place], and Remington Skalsky,  who works at the [employer] in in [place]. He has 1 grand son, aged 53. The patient and her husband are not church attenders.   ADVANCED DIRECTIVES:  Not in place   HEALTH MAINTENANCE: Social History   Tobacco Use  . Smoking status: Never Smoker  . Smokeless tobacco: Never Used  Substance Use Topics  . Alcohol use: Yes    Alcohol/week: 1.0 standard drinks    Types: 1 Standard drinks or equivalent per week    Comment: social  . Drug use: No     Colonoscopy:  May 2016  PAP:  Bone density: 07/01/2016 at [facility] showed normal, with a T score of -0.7  Lipid panel:  Allergies  Allergen Reactions  . Nsaids Other (See Comments)    H/o gastric bypass - avoid NSAIDs  . Tape Hives, Rash and Other (See Comments)    Adhesives in bandaids    Current Outpatient Medications  Medication Sig Dispense Refill  . acetaminophen (TYLENOL) 325 MG tablet Take 650 mg by mouth 2 (two) times daily as needed for moderate pain or headache.     [name] buPROPion  (WELLBUTRIN SR) 200 MG 12 hr tablet TAKE 1 TABLET BY MOUTH DAILY AT 12 NOON. 30 tablet 0  . CALCIUM CITRATE-VITAMIN D PO Take 1 tablet by mouth 3 (three) times daily.     . cholecalciferol (VITAMIN D) 1000 units tablet Take 1,000 Units by mouth daily.    . Cyanocobalamin (VITAMIN B-12 PO) Take 1 tablet by mouth every 14 (fourteen) days.    . diphenhydrAMINE (BENADRYL) 25 MG tablet Take 25  mg by mouth daily as needed for allergies or sleep.     . Insulin Glargine (LANTUS SOLOSTAR) 100 UNIT/ML Solostar Pen Inject 12 Units into the skin daily. 5 pen 0  . Insulin Pen Needle (BD PEN NEEDLE NANO U/F) 32G X 4 MM MISC 1 Package by Does not apply route daily at 12 noon. 100 each 0  . liraglutide (VICTOZA) 18 MG/3ML SOPN INJECT 1.8MG(0.3 MLS) INTO THE SKIN EVERY MORNING 3 pen 0  . loratadine (CLARITIN) 10 MG tablet Take 10 mg by mouth as needed for allergies.    [name] losartan (COZAAR) 100 MG tablet Take 1 tablet (100 mg total) by mouth daily. 30 tablet 0  . Melatonin 3 MG CAPS Take 3 mg by mouth at bedtime.     . metFORMIN (GLUCOPHAGE) 500 MG tablet Take 1 tablet (500 mg total) by mouth daily. 30 tablet 0  . ONETOUCH DELICA LANCETS 57V MISC 1 Package by Does not apply route daily. 100 each 0  . Pediatric Multivitamins-Iron (FLINTSTONES PLUS IRON) chewable tablet Chew 1 tablet by mouth 2 (two) times daily.    . Probiotic Product (CVS PROBIOTIC PO) Take 1 capsule by mouth daily.     . prochlorperazine (COMPAZINE) 10 MG tablet Take 1 tablet (10 mg total) by mouth every 6 (six) hours as needed for nausea or vomiting. 30 tablet 0  . Propylene Glycol (SYSTANE BALANCE OP) Place 1-2 drops into both eyes 3 (three) times daily as needed (for dry eyes).     [name] senna-docusate (SENNA PLUS) 8.6-50 MG tablet Take 1 tablet by mouth daily as needed for moderate constipation.     . topiramate (TOPAMAX) 25 MG tablet TAKE 1 TABLET ONCE DAILY. 30 tablet 0  . vortioxetine HBr (TRINTELLIX) 20 MG TABS tablet Take 1 tablet (20 mg  total) by mouth at bedtime. 30 tablet 0   No current facility-administered medications for this visit.     OBJECTIVE: Latest white woman who appears stated age  33:   04/07/18 0833  BP: (!) 129/51  Pulse: 62  Resp: 18  Temp: 97.6 F (36.4 C)  SpO2: 98%     Body mass index is 29.18 kg/m.    ECOG FS:1 - Symptomatic but completely ambulatory Filed Weights   04/07/18 0833  Weight: 197 lb 9.6 oz (89.6 kg)  GENERAL: Patient is a well appearing female in no acute distress HEENT:  Sclerae anicteric.  Oropharynx clear and moist. No ulcerations or evidence of oropharyngeal candidiasis. Neck is supple.  NODES:  No cervical, supraclavicular, or axillary lymphadenopathy palpated.  BREAST EXAM:  Right mastectomy site without any evidence of local recurrence, left breast benign. LUNGS:  Clear to auscultation bilaterally.  No wheezes or rhonchi. HEART:  Regular rate and rhythm. No murmur appreciated. ABDOMEN:  Soft, nontender.  Positive, normoactive bowel sounds. No organomegaly palpated. MSK:  No focal spinal tenderness to palpation. Full range of motion bilaterally in the upper extremities. EXTREMITIES:  No peripheral edema.   SKIN:  Clear with no obvious rashes or skin changes. No nail dyscrasia. NEURO:  Nonfocal. Well oriented.  Appropriate affect.       LAB RESULTS:  CMP     Component Value Date/Time   NA 143 03/24/2018 0825   NA 143 03/11/2017 0841   NA 140 01/07/2017 0758   K 4.0 03/24/2018 0825   K 4.1 01/07/2017 0758   CL 113 (H) 03/24/2018 0825   CO2 22 03/24/2018 0825   CO2 24 01/07/2017 0758  GLUCOSE 125 (H) 03/24/2018 0825   GLUCOSE 134 01/07/2017 0758   BUN 25 (H) 03/24/2018 0825   BUN 40 (H) 03/11/2017 0841   BUN 33.6 (H) 01/07/2017 0758   CREATININE 1.53 (H) 03/24/2018 0825   CREATININE 1.74 (H) 12/16/2017 0806   CREATININE 1.5 (H) 01/07/2017 0758   CALCIUM 8.7 (L) 03/24/2018 0825   CALCIUM 9.3 01/07/2017 0758   PROT 6.2 (L) 03/24/2018 0825   PROT 6.9  03/11/2017 0841   PROT 6.7 01/07/2017 0758   ALBUMIN 3.6 03/24/2018 0825   ALBUMIN 4.5 03/11/2017 0841   ALBUMIN 3.6 01/07/2017 0758   AST 61 (H) 03/24/2018 0825   AST 43 (H) 12/16/2017 0806   AST 51 (H) 01/07/2017 0758   ALT 79 (H) 03/24/2018 0825   ALT 57 (H) 12/16/2017 0806   ALT 82 (H) 01/07/2017 0758   ALKPHOS 130 (H) 03/24/2018 0825   ALKPHOS 115 01/07/2017 0758   BILITOT 0.6 03/24/2018 0825   BILITOT 0.6 12/16/2017 0806   BILITOT 0.72 01/07/2017 0758   GFRNONAA 35 (L) 03/24/2018 0825   GFRNONAA 30 (L) 12/16/2017 0806   GFRNONAA 55 (L) 02/18/2013 0827   GFRAA 41 (L) 03/24/2018 0825   GFRAA 35 (L) 12/16/2017 0806   GFRAA 63 02/18/2013 0827    INo results found for: SPEP, UPEP  Lab Results  Component Value Date   WBC 3.8 (L) 04/07/2018   NEUTROABS 2.6 04/07/2018   HGB 9.8 (L) 04/07/2018   HCT 30.1 (L) 04/07/2018   MCV 98.7 04/07/2018   PLT 136 (L) 04/07/2018      Chemistry      Component Value Date/Time   NA 143 03/24/2018 0825   NA 143 03/11/2017 0841   NA 140 01/07/2017 0758   K 4.0 03/24/2018 0825   K 4.1 01/07/2017 0758   CL 113 (H) 03/24/2018 0825   CO2 22 03/24/2018 0825   CO2 24 01/07/2017 0758   BUN 25 (H) 03/24/2018 0825   BUN 40 (H) 03/11/2017 0841   BUN 33.6 (H) 01/07/2017 0758   CREATININE 1.53 (H) 03/24/2018 0825   CREATININE 1.74 (H) 12/16/2017 0806   CREATININE 1.5 (H) 01/07/2017 0758      Component Value Date/Time   CALCIUM 8.7 (L) 03/24/2018 0825   CALCIUM 9.3 01/07/2017 0758   ALKPHOS 130 (H) 03/24/2018 0825   ALKPHOS 115 01/07/2017 0758   AST 61 (H) 03/24/2018 0825   AST 43 (H) 12/16/2017 0806   AST 51 (H) 01/07/2017 0758   ALT 79 (H) 03/24/2018 0825   ALT 57 (H) 12/16/2017 0806   ALT 82 (H) 01/07/2017 0758   BILITOT 0.6 03/24/2018 0825   BILITOT 0.6 12/16/2017 0806   BILITOT 0.72 01/07/2017 0758       No results found for: LABCA2  No components found for: EBRAX094  No results for input(s): INR in the last 168 hours.   Urinalysis    Component Value Date/Time   COLORURINE YELLOW 07/21/2015 [place] 07/21/2015 2328   LABSPEC 1.020 07/21/2015 2328   PHURINE 6.0 07/21/2015 2328   GLUCOSEU NEGATIVE 07/21/2015 2328   HGBUR TRACE (A) 07/21/2015 2328   BILIRUBINUR NEGATIVE 07/21/2015 2328   BILIRUBINUR neg 06/29/2013 1017   KETONESUR NEGATIVE 07/21/2015 2328   PROTEINUR 30 (A) 07/21/2015 2328   UROBILINOGEN negative 06/29/2013 1017   NITRITE NEGATIVE 07/21/2015 2328   LEUKOCYTESUR NEGATIVE 07/21/2015 2328    STUDIES: No results found.   ASSESSMENT: 68 y.o. [place] woman status post  right breast upper outer quadrant and right axillary lymph node biopsy 07/19/2014 for a cT2  pN1, stage IIB  invasive ductal carcinoma, grade 2, estrogen and progesterone receptor negative, with an MIB-1 of 90%, and HER-2 amplified  (1) neoadjuvant chemotherapy started 08/08/2014, consisting of carboplatin, docetaxel, trastuzumab and pertuzumab given every 3 weeks 6, completed 11/21/2014  (a) breast MRI after initial 3 cycles shows a significant response  (b) breast MRI after 6 cycles shows a complete radiologic response   (2) trastuzumab continued through 10/02/2015 (to end of capecitabine therapy)  (a) echo 02/13/2015 shows an EF of 60-65%  (b)  echo 05/09/2015 shows an ejection fraction of 60-65%  (c) echocardiogram 06/27/2015 shows an ejection fraction of 55-60%.  (d) echocardiogram 01/22/2017 shows an ejection fraction of 55-60%  (3) right lumpectomy with sentinel lymph node biopsy on 12/27/14 showed a residual  ypT2 ypN1a, stage IIB invasive ductal carcinoma, grade 2, with repeat estrogen and progesterone receptors again negative  (4) status post right oncoplastic surgery (with left reduction mammoplasty) 01/06/2015 showing in the right breast multiple foci of intralymphatic carcinoma in random sections  (5)  Right modified radical mastectomy 02/15/2014 showed no residual invasive disease; there  was DCIS but margins were negative; all 15 axillary lymph nodes were clear; ypTis ypN0  (a)  The patient has decided against reconstruction.  (6)  Postmastectomy radiation completed 05/18/2015 with capecitabine sensitization  (7) adjuvant capecitabine continued through 09/27/2015  (8) hepatic steatosis with increased fibrosis score (at risk for cirrhosis)  (9) thyroid biopsy (left lobe inferiorly) 2 shows benign tissue 06/28/2015  (10) LOCAL RECURRENCE:  (a) PET scan 12/06/2015 Notes a right retropectoral lymph node measuring 0.9 cm with an SUV max of 5.5  (b) repeat PET scan 03/11/2016 finds the right subpectoral lymph node to now measure 1.3 cm with an SUV max of 11.8.  (c) PET scan 05/07/2016 shows a significant response to T-DM 1  (d) PET scan 07/10/2017 is clear except for what appears to be a single new liver lesion   (11) started T-DM1 03/21/2016, completed 8 doses 08/13/2016  (a) PET scan 08/19/2016 shows a good response but measurable residual disease  (b) PET scan on 01/16/2017 shows a continuing response  (12) maintenance trastuzumab started 09/03/2016--changed to every 28 days as of 01/28/2017  (a) repeat echocardiogram 07/08/2016 shows an ejection fraction of 60-65%.  (b) echocardiogram on 10/08/2016 demonstrates LVEF of 55-60% (followed by Dr. Haroldine Laws)  (c) echo 01/22/2017 shows an ejection fraction in the 55-60% range  (d) echo 04/24/2017 shows an ejection fraction in the 55-60% range (recommendation for echo every 6 months by Dr. Aundra Dubin)  (e) trastuzumab changed to T-DM 1 after trastuzumab dose 07/15/2017  METASTATIC DISEASE: July 2019 (13)  Radiation to the area of persistent disease in the right upper chest completed 10/30/2016   (14) T-DM 1 resumed 08/12/2017, repeated every 21 days  (a) baseline liver MRI 08/02/2017 shows a 1.4 cm mass, "hot" on PET 07/10/2017  (b) repeat liver MRI 10/22/2018 3:19 cycles of T-DM 1, shows slight enlargement  (c) T-DM 1  discontinued after 09/23/2017 dose  (15) trastuzumab resumed 11/18/2017, to be repeated every 4 weeks, changed back to every 3 weeks in 03/2018  (16) liver biopsy 11/03/2017 shows adenocarcinoma, most consistent with an upper gastrointestinal primary; this tumor was estrogen receptor and HER-2 negative  (a) GI workup for occult primary Collene Mares) negative  (b) PET 12/09/2017 shows no obvious primary.  There is a right subpectoral lymph node noted as  well  (c) CA 27-29 and CA 19-9 uninformative; CEA pending   (17) to start Abraxane on 12/10, given days 1 and 8 on a 21 day cycle x 3  (a)restaging with MRI liver 02/24/2018 shows a gratifying initial response  (b) PET scan 05/14/2018  (18) consider Yttrium or other local treatment to liver per IR   PLAN: Darlene [name] is doing well today.  She has no clinical signs of progression and appears to be tolerating and responding to her treatments quite well.  She has no peripheral neuropathy, and her labs are stable.   She will be ok to treat with her creatinine of 1.79 today.     I apologized for her missed appointment from last week and the schedule confusion, however we have gotten her appointments back on track and she will receive Abraxane on days 1 and 8 of a 21 day cycle and Trastuzumab on day 8 of a 21 day cycle.    Darlene [name]'s echocardiogram was normal and I reviewed that with her in detail.  We reviewed her upcoming PET scan appointment on 05/14/2018.  Darlene [name] will return in one week for labs, Abraxane, and Trastuzumab, and in 3 weeks for labs, f/u with Dr. Jana Hakim, and Abraxane.  She knows to call for any questions or concerns prior to her next appointment with [name].    I reviewed the above with Dr. Jana Hakim who is in agreement with the plan and helped to formulate it.  A total of (30) minutes of face-to-face time was spent with this patient with greater than 50% of that time in counseling and care-coordination.   Wilber Bihari, NP  04/07/18 8:45 AM Medical  Oncology and Hematology [facility] [address] [place], [place] [address] Tel. [phone]    Fax. [phone]

## 2018-04-07 NOTE — Patient Instructions (Signed)
[facility] Discharge Instructions for Patients Receiving Chemotherapy  Today you received the following chemotherapy agents :  Abraxane,  To help prevent nausea and vomiting after your treatment, we encourage you to take your nausea medication as prescribed.   If you develop nausea and vomiting that is not controlled by your nausea medication, call the clinic.   BELOW ARE SYMPTOMS THAT SHOULD BE REPORTED IMMEDIATELY:  *FEVER GREATER THAN 100.5 F  *CHILLS WITH OR WITHOUT FEVER  NAUSEA AND VOMITING THAT IS NOT CONTROLLED WITH YOUR NAUSEA MEDICATION  *UNUSUAL SHORTNESS OF BREATH  *UNUSUAL BRUISING OR BLEEDING  TENDERNESS IN MOUTH AND THROAT WITH OR WITHOUT PRESENCE OF ULCERS  *URINARY PROBLEMS  *BOWEL PROBLEMS  UNUSUAL RASH Items with * indicate a potential emergency and should be followed up as soon as possible.  Feel free to call the clinic should you have any questions or concerns. The clinic phone number is [phone].  Please show the [facility] at check-in to the Emergency Department and triage nurse.

## 2018-04-10 ENCOUNTER — Other Ambulatory Visit (INDEPENDENT_AMBULATORY_CARE_PROVIDER_SITE_OTHER): Payer: Self-pay | Admitting: Family Medicine

## 2018-04-10 DIAGNOSIS — I10 Essential (primary) hypertension: Secondary | ICD-10-CM

## 2018-04-11 ENCOUNTER — Encounter (INDEPENDENT_AMBULATORY_CARE_PROVIDER_SITE_OTHER): Payer: Self-pay | Admitting: Family Medicine

## 2018-04-14 ENCOUNTER — Other Ambulatory Visit: Payer: Self-pay

## 2018-04-14 ENCOUNTER — Inpatient Hospital Stay: Payer: Medicare Other

## 2018-04-14 VITALS — BP 153/68 | HR 69 | Temp 98.3°F | Resp 18

## 2018-04-14 DIAGNOSIS — Z5112 Encounter for antineoplastic immunotherapy: Secondary | ICD-10-CM | POA: Diagnosis not present

## 2018-04-14 DIAGNOSIS — C50411 Malignant neoplasm of upper-outer quadrant of right female breast: Secondary | ICD-10-CM

## 2018-04-14 DIAGNOSIS — C787 Secondary malignant neoplasm of liver and intrahepatic bile duct: Secondary | ICD-10-CM

## 2018-04-14 DIAGNOSIS — Z95828 Presence of other vascular implants and grafts: Secondary | ICD-10-CM

## 2018-04-14 DIAGNOSIS — Z171 Estrogen receptor negative status [ER-]: Secondary | ICD-10-CM

## 2018-04-14 DIAGNOSIS — C50911 Malignant neoplasm of unspecified site of right female breast: Secondary | ICD-10-CM

## 2018-04-14 DIAGNOSIS — C801 Malignant (primary) neoplasm, unspecified: Secondary | ICD-10-CM

## 2018-04-14 LAB — CBC WITH DIFFERENTIAL/PLATELET
Abs Immature Granulocytes: 0.02 10*3/uL (ref 0.00–0.07)
Basophils Absolute: 0 10*3/uL (ref 0.0–0.1)
Basophils Relative: 1 %
Eosinophils Absolute: 0.1 10*3/uL (ref 0.0–0.5)
Eosinophils Relative: 4 %
HCT: 29.3 % — ABNORMAL LOW (ref 36.0–46.0)
Hemoglobin: 9.5 g/dL — ABNORMAL LOW (ref 12.0–15.0)
Immature Granulocytes: 1 %
Lymphocytes Relative: 29 %
Lymphs Abs: 0.8 10*3/uL (ref 0.7–4.0)
MCH: 31.6 pg (ref 26.0–34.0)
MCHC: 32.4 g/dL (ref 30.0–36.0)
MCV: 97.3 fL (ref 80.0–100.0)
Monocytes Absolute: 0.2 10*3/uL (ref 0.1–1.0)
Monocytes Relative: 6 %
Neutro Abs: 1.5 10*3/uL — ABNORMAL LOW (ref 1.7–7.7)
Neutrophils Relative %: 59 %
Platelets: 131 10*3/uL — ABNORMAL LOW (ref 150–400)
RBC: 3.01 MIL/uL — ABNORMAL LOW (ref 3.87–5.11)
RDW: 14.5 % (ref 11.5–15.5)
WBC: 2.6 10*3/uL — ABNORMAL LOW (ref 4.0–10.5)
nRBC: 0 % (ref 0.0–0.2)

## 2018-04-14 LAB — COMPREHENSIVE METABOLIC PANEL
ALT: 75 U/L — ABNORMAL HIGH (ref 0–44)
AST: 42 U/L — ABNORMAL HIGH (ref 15–41)
Albumin: 3.7 g/dL (ref 3.5–5.0)
Alkaline Phosphatase: 141 U/L — ABNORMAL HIGH (ref 38–126)
Anion gap: 9 (ref 5–15)
BUN: 26 mg/dL — ABNORMAL HIGH (ref 8–23)
CO2: 20 mmol/L — ABNORMAL LOW (ref 22–32)
Calcium: 9 mg/dL (ref 8.9–10.3)
Chloride: 113 mmol/L — ABNORMAL HIGH (ref 98–111)
Creatinine, Ser: 1.56 mg/dL — ABNORMAL HIGH (ref 0.44–1.00)
GFR calc Af Amer: 40 mL/min — ABNORMAL LOW (ref >60)
GFR calc non Af Amer: 35 mL/min — ABNORMAL LOW (ref >60)
Glucose, Bld: 164 mg/dL — ABNORMAL HIGH (ref 70–99)
Potassium: 4.2 mmol/L (ref 3.5–5.1)
Sodium: 142 mmol/L (ref 135–145)
Total Bilirubin: 0.6 mg/dL (ref 0.3–1.2)
Total Protein: 6.3 g/dL — ABNORMAL LOW (ref 6.5–8.1)

## 2018-04-14 MED ORDER — PROCHLORPERAZINE MALEATE 10 MG PO TABS
ORAL_TABLET | ORAL | Status: AC
  Filled 2018-04-14: qty 1

## 2018-04-14 MED ORDER — SODIUM CHLORIDE 0.9 % IV SOLN
Freq: Once | INTRAVENOUS | Status: AC
  Administered 2018-04-14: 09:00:00 via INTRAVENOUS
  Filled 2018-04-14: qty 250

## 2018-04-14 MED ORDER — SODIUM CHLORIDE 0.9% FLUSH
10.0000 mL | INTRAVENOUS | Status: DC | PRN
  Administered 2018-04-14: 10 mL via INTRAVENOUS
  Filled 2018-04-14: qty 10

## 2018-04-14 MED ORDER — TRASTUZUMAB-ANNS CHEMO 420 MG IV SOLR
6.0000 mg/kg | Freq: Once | INTRAVENOUS | Status: AC
  Administered 2018-04-14: 546 mg via INTRAVENOUS
  Filled 2018-04-14: qty 20

## 2018-04-14 MED ORDER — SODIUM CHLORIDE 0.9% FLUSH
10.0000 mL | INTRAVENOUS | Status: DC | PRN
  Administered 2018-04-14: 10 mL
  Filled 2018-04-14: qty 10

## 2018-04-14 MED ORDER — PROCHLORPERAZINE MALEATE 10 MG PO TABS: 10.0000 mg | ORAL_TABLET | Freq: Once | ORAL | Status: DC

## 2018-04-14 MED ORDER — PACLITAXEL PROTEIN-BOUND CHEMO INJECTION 100 MG
80.0000 mg/m2 | Freq: Once | INTRAVENOUS | Status: AC
  Administered 2018-04-14: 175 mg via INTRAVENOUS
  Filled 2018-04-14: qty 35

## 2018-04-14 MED ORDER — DIPHENHYDRAMINE HCL 25 MG PO CAPS: 25.0000 mg | ORAL_CAPSULE | Freq: Once | ORAL | Status: DC

## 2018-04-14 MED ORDER — ACETAMINOPHEN 325 MG PO TABS: 650.0000 mg | ORAL_TABLET | Freq: Once | ORAL | Status: DC

## 2018-04-14 MED ORDER — HEPARIN SOD (PORK) LOCK FLUSH 100 UNIT/ML IV SOLN
500.0000 [IU] | Freq: Once | INTRAVENOUS | Status: AC | PRN
  Administered 2018-04-14: 500 [IU]
  Filled 2018-04-14: qty 5

## 2018-04-14 NOTE — Patient Instructions (Signed)
[name] Discharge Instructions for Patients Receiving Chemotherapy  Today you received the following chemotherapy agents Kanjinti and Abraxane  To help prevent nausea and vomiting after your treatment, we encourage you to take your nausea medication as directed.   If you develop nausea and vomiting that is not controlled by your nausea medication, call the clinic.   BELOW ARE SYMPTOMS THAT SHOULD BE REPORTED IMMEDIATELY:  *FEVER GREATER THAN 100.5 F  *CHILLS WITH OR WITHOUT FEVER  NAUSEA AND VOMITING THAT IS NOT CONTROLLED WITH YOUR NAUSEA MEDICATION  *UNUSUAL SHORTNESS OF BREATH  *UNUSUAL BRUISING OR BLEEDING  TENDERNESS IN MOUTH AND THROAT WITH OR WITHOUT PRESENCE OF ULCERS  *URINARY PROBLEMS  *BOWEL PROBLEMS  UNUSUAL RASH Items with * indicate a potential emergency and should be followed up as soon as possible.  Feel free to call the clinic you have any questions or concerns. The clinic phone number is [phone].  Please show the [facility] at check-in to the Emergency Department and triage nurse.

## 2018-04-14 NOTE — Progress Notes (Signed)
Okay to treat today with Creat. 1.56, per Dr. Jana Hakim.

## 2018-04-15 ENCOUNTER — Other Ambulatory Visit: Payer: Self-pay

## 2018-04-15 ENCOUNTER — Ambulatory Visit (INDEPENDENT_AMBULATORY_CARE_PROVIDER_SITE_OTHER): Payer: Medicare Other | Admitting: Family Medicine

## 2018-04-15 ENCOUNTER — Encounter (INDEPENDENT_AMBULATORY_CARE_PROVIDER_SITE_OTHER): Payer: Self-pay | Admitting: Family Medicine

## 2018-04-15 DIAGNOSIS — Z6830 Body mass index (BMI) 30.0-30.9, adult: Secondary | ICD-10-CM

## 2018-04-15 DIAGNOSIS — E669 Obesity, unspecified: Secondary | ICD-10-CM

## 2018-04-15 DIAGNOSIS — F3289 Other specified depressive episodes: Secondary | ICD-10-CM

## 2018-04-15 MED ORDER — TOPIRAMATE 25 MG PO TABS: 25.0000 mg | ORAL_TABLET | Freq: Every day | ORAL | 0 refills | Status: DC

## 2018-04-15 NOTE — Patient Outreach (Signed)
Darlene [name]) Care Management  04/15/2018  Darlene [name] 05-15-1950 [id number]   Medication Adherence call to Darlene [name] patient is showing past due on Losartan 100 mg patient did not answer ,[facility] said patient pick up on 04/14/18 for a 30 days supply. Darlene [name] is showing past due under [place].    [place] Management Direct Dial [phone]  Fax [phone] [email or web address]

## 2018-04-16 ENCOUNTER — Encounter (INDEPENDENT_AMBULATORY_CARE_PROVIDER_SITE_OTHER): Payer: Self-pay

## 2018-04-16 NOTE — Progress Notes (Signed)
Office: [phone]  /  Fax: [phone] TeleHealth Visit:  ANTONAE ZBIKOWSKI has consented to this TeleHealth visit today via telephone call due to lack of video access. The patient is located at home, the provider is located at the [facility] office. The participants in this visit include the listed provider and patient and provider's assistant.   HPI:   Chief Complaint: OBESITY Darlene [name] is here to discuss her progress with her obesity treatment plan. She is on the keep a food journal with 1300-1500 calories and 80 grams of protein daily and is following her eating plan approximately 60 % of the time. She states she is exercising 0 minutes 0 times per week. I talked to [facility] via phone call due to WiFi being down. She is struggling with some emotional eating, especially with being so isolated sue to being immunocompromised on chemotherapy during COVID-19. She is trying to keep herself entertained and she has increased vegetables. She feels she has maintained her weight. She states her blood sugar ranges between 95 and 110 in the morning, and one low of 72 in the middle of the day.  We were unable to weight the patient today for this TeleHealth visit. She feels as if she has maintained her weight since her last visit. She has lost 30 lbs since starting treatment with [name].  Depression with emotional eating behaviors Ted's mood is stable overall. 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 She is doing a bit more emotional eating but still mindful. Meggie is using food for comfort to the extent that it is negatively impacting her health. She often snacks when she is not hungry. Travonna sometimes feels she is out of control and then feels guilty that she made poor food choices. She has been working on behavior modification techniques to help reduce her emotional eating and has been somewhat successful. She shows no sign of suicidal or homicidal ideations.   Depression screen [facility] 2/9 09/12/2016 08/09/2016  Decreased Interest 0 1  Down, Depressed, Hopeless 0 1  PHQ - 2 Score 0 2  Altered sleeping - 0  Tired, decreased energy - 1  Change in appetite - 2  Feeling bad or failure about yourself  - 2  Trouble concentrating - 1  Moving slowly or fidgety/restless - 1  Suicidal thoughts - 0  PHQ-9 Score - 9  Some recent data might be hidden    ASSESSMENT AND PLAN:  Other depression - with emotional eating - Plan: topiramate (TOPAMAX) 25 MG tablet  Class 1 obesity with serious comorbidity and body mass index (BMI) of 30.0 to 30.9 in adult, unspecified obesity type - Starting BMI greater then 30  PLAN:  Depression with Emotional Eating Behaviors We discussed behavior modification techniques today to help Lilyonna deal with her emotional eating and depression. Gennavieve agrees to continue taking topiramate 25 mg qd #30 and we will refill for 1 month. Saryiah agrees to follow up with our clinic in 3 weeks.  Obesity Leesa is currently in the action stage of change. As such, her goal is to maintain weight for now She has agreed to keep a food journal with 1300-1500 calories and 80 grams of protein daily Sommer has been instructed to work up to a goal of 150 minutes of combined cardio and strengthening exercise per week for weight loss and overall health benefits. We discussed the following Behavioral Modification Strategies today: increasing vegetables, increasing fiber rich foods, emotional eating strategies, ways to avoid  night time snacking, and better snacking choices Raysha's goal is to maintain weight while undergoing chemotherapy.  Baleria has agreed to follow up with our clinic in 3 weeks. She was informed of the importance of frequent follow up visits to maximize her success with intensive lifestyle modifications for her multiple health conditions.  ALLERGIES: Allergies  Allergen Reactions  . Nsaids Other (See Comments)    H/o  gastric bypass - avoid NSAIDs  . Tape Hives, Rash and Other (See Comments)    Adhesives in bandaids    MEDICATIONS: Current Outpatient Medications on File Prior to Visit  Medication Sig Dispense Refill  . acetaminophen (TYLENOL) 325 MG tablet Take 650 mg by mouth 2 (two) times daily as needed for moderate pain or headache.     [name] buPROPion (WELLBUTRIN SR) 200 MG 12 hr tablet TAKE 1 TABLET BY MOUTH DAILY AT 12 NOON. 30 tablet 0  . CALCIUM CITRATE-VITAMIN D PO Take 1 tablet by mouth 3 (three) times daily.     . cholecalciferol (VITAMIN D) 1000 units tablet Take 1,000 Units by mouth daily.    . Cyanocobalamin (VITAMIN B-12 PO) Take 1 tablet by mouth every 14 (fourteen) days.    . diphenhydrAMINE (BENADRYL) 25 MG tablet Take 25 mg by mouth daily as needed for allergies or sleep.     . Insulin Glargine (LANTUS SOLOSTAR) 100 UNIT/ML Solostar Pen Inject 12 Units into the skin daily. 5 pen 0  . Insulin Pen Needle (BD PEN NEEDLE NANO U/F) 32G X 4 MM MISC 1 Package by Does not apply route daily at 12 noon. 100 each 0  . liraglutide (VICTOZA) 18 MG/3ML SOPN INJECT 1.8MG (0.3 MLS) INTO THE SKIN EVERY MORNING 3 pen 0  . loratadine (CLARITIN) 10 MG tablet Take 10 mg by mouth as needed for allergies.    [name] losartan (COZAAR) 100 MG tablet Take 1 tablet (100 mg total) by mouth daily. 30 tablet 0  . Melatonin 3 MG CAPS Take 3 mg by mouth at bedtime.     . metFORMIN (GLUCOPHAGE) 500 MG tablet Take 1 tablet (500 mg total) by mouth daily. 30 tablet 0  . ONETOUCH DELICA LANCETS 49Q MISC 1 Package by Does not apply route daily. 100 each 0  . Pediatric Multivitamins-Iron (FLINTSTONES PLUS IRON) chewable tablet Chew 1 tablet by mouth 2 (two) times daily.    . Probiotic Product ([employer]S PROBIOTIC PO) Take 1 capsule by mouth daily.     . prochlorperazine (COMPAZINE) 10 MG tablet Take 1 tablet (10 mg total) by mouth every 6 (six) hours as needed for nausea or vomiting. 30 tablet 0  . Propylene Glycol (SYSTANE BALANCE OP)  Place 1-2 drops into both eyes 3 (three) times daily as needed (for dry eyes).     [name] senna-docusate (SENNA PLUS) 8.6-50 MG tablet Take 1 tablet by mouth daily as needed for moderate constipation.     . vortioxetine HBr (TRINTELLIX) 20 MG TABS tablet Take 1 tablet (20 mg total) by mouth at bedtime. 30 tablet 0   No current facility-administered medications on file prior to visit.     PAST MEDICAL HISTORY: Past Medical History:  Diagnosis Date  . Anemia    hx of - during 1st round chemo  . Anxiety   . Arthritis    in fingers, shoulders  . Back pain   . Balance problem   . Breast cancer ([place])   . Breast cancer of upper-outer quadrant of right female breast ([place]) 07/22/2014  .  Bunion, right foot   . Clumsiness   . Constipation   . Depression   . Diabetes mellitus without complication ([place])    69+ years  . Eating disorder    binge eating  . Epistaxis 08/26/2014  . Fatty liver   . HCAP (healthcare-associated pneumonia) 12/18/2014  . Headache   . Heart murmur    never had any problems  . History of radiation therapy 04/05/15-05/19/15   right chest wall was treated to 50.4 Gy in 28 fractions, right mastectomy scar was treated to 10 Gy in 5 fractions  . Hyperlipidemia   . Hypertension   . Joint pain   . Multiple food allergies   . Neuromuscular disorder ([place])    diabetic neuropathy  . Obesity   . Obstructive sleep apnea on CPAP    uses cpap nightly  . Ovarian cyst rupture    possible  . Pneumonia   . Pneumonia 11/2014  . Radiation 04/05/15-05/19/15   right chest wall 50.4 Gy, mastectomy scar 10 Gy  . Sleep apnea   . Thyroid nodule 06/2014    PAST SURGICAL HISTORY: Past Surgical History:  Procedure Laterality Date  . APPENDECTOMY    . BIOPSY THYROID Bilateral 06/2014   2 nodules - Benign   . BREAST LUMPECTOMY WITH RADIOACTIVE SEED AND SENTINEL LYMPH NODE BIOPSY Right 12/27/2014   Procedure: BREAST LUMPECTOMY WITH RADIOACTIVE SEED AND SENTINEL LYMPH NODE BIOPSY;  Surgeon: Stark Klein, MD;  Location: [place];  Service: General;  Laterality: Right;  . BREAST REDUCTION SURGERY Bilateral 01/06/2015   Procedure: BILATERAL BREAST REDUCTION, RIGHT ONCOPLASTIC RECONSTRUCTION),LEFT BREAST REDUCTION FOR SYMMETRY;  Surgeon: [name], MD;  Location: [place];  Service: Plastics;  Laterality: Bilateral;  . BREATH TEK H PYLORI N/A 09/15/2012   Procedure: BREATH TEK H PYLORI;  Surgeon: Pedro Earls, MD;  Location: [facility];  Service: General;  Laterality: N/A;  . BUNIONECTOMY Left 12/2013  . CARPAL TUNNEL RELEASE Right   . CARPAL TUNNEL RELEASE Left   . CATARACT EXTRACTION     6/19  . COLONOSCOPY    . DUPUYTREN CONTRACTURE RELEASE    . GASTRIC ROUX-EN-Y N/A 11/02/2012   Procedure: LAPAROSCOPIC ROUX-EN-Y GASTRIC;  Surgeon: Pedro Earls, MD;  Location: [facility];  Service: General;  Laterality: N/A;  . IR GENERIC HISTORICAL  03/18/2016   IR US GUIDE VASC ACCESS LEFT 03/18/2016 Markus Daft, MD [facility]  . IR GENERIC HISTORICAL  03/18/2016   IR FLUORO GUIDE CV LINE LEFT 03/18/2016 Markus Daft, MD [facility]  . LAPAROSCOPIC APPENDECTOMY N/A 07/22/2015   Procedure: APPENDECTOMY LAPAROSCOPIC;  Surgeon: Michael Boston, MD;  Location: [facility];  Service: General;  Laterality: N/A;  . MASTECTOMY Right 02/15/2015   modified  . MODIFIED MASTECTOMY Right 02/15/2015   Procedure: RIGHT MODIFIED RADICAL MASTECTOMY;  Surgeon: Stark Klein, MD;  Location: [place];  Service: General;  Laterality: Right;  . PORT-A-CATH REMOVAL Left 10/10/2015   Procedure: PORT REMOVAL;  Surgeon: Stark Klein, MD;  Location: [place];  Service: General;  Laterality: Left;  . PORTACATH PLACEMENT Left 08/02/2014   Procedure: INSERTION PORT-A-CATH;  Surgeon: Stark Klein, MD;  Location: [place];  Service: General;  Laterality: Left;  . PORTACATH PLACEMENT N/A 11/05/2016   Procedure: INSERTION PORT-A-CATH;  Surgeon: Stark Klein, MD;  Location: [place];  Service: General;   Laterality: N/A;  . ROTATOR CUFF REPAIR Right 2005  . SCAR REVISION Right 12/08/2015   Procedure: COMPLEX REPAIR RIGHT  CHEST 10-15CM;  Surgeon: [name], MD;  Location: [place];  Service: Plastics;  Laterality: Right;  . TOE SURGERY  1970s   bone spur  . TRIGGER FINGER RELEASE     x3    SOCIAL HISTORY: Social History   Tobacco Use  . Smoking status: Never Smoker  . Smokeless tobacco: Never Used  Substance Use Topics  . Alcohol use: Yes    Alcohol/week: 1.0 standard drinks    Types: 1 Standard drinks or equivalent per week    Comment: social  . Drug use: No    FAMILY HISTORY: Family History  Problem Relation Age of Onset  . CVA Mother   . Heart disease Mother   . Sudden death Mother   . Diabetes Father   . Heart failure Father   . Hypertension Father   . Kidney disease Father   . Obesity Father   . Hypertension Sister   . Diabetes Brother   . Breast cancer Paternal Grandmother 62  . Diabetes Paternal Uncle   . Ovarian cancer Neg Hx     ROS: Review of Systems  Constitutional: Negative for weight loss.  Psychiatric/Behavioral: Positive for depression. Negative for suicidal ideas.    PHYSICAL EXAM: Pt in no acute distress  RECENT LABS AND TESTS: BMET    Component Value Date/Time   NA 142 04/14/2018 0817   NA 143 03/11/2017 0841   NA 140 01/07/2017 0758   K 4.2 04/14/2018 0817   K 4.1 01/07/2017 0758   CL 113 (H) 04/14/2018 0817   CO2 20 (L) 04/14/2018 0817   CO2 24 01/07/2017 0758   GLUCOSE 164 (H) 04/14/2018 0817   GLUCOSE 134 01/07/2017 0758   BUN 26 (H) 04/14/2018 0817   BUN 40 (H) 03/11/2017 0841   BUN 33.6 (H) 01/07/2017 0758   CREATININE 1.56 (H) 04/14/2018 0817   CREATININE 1.74 (H) 12/16/2017 0806   CREATININE 1.5 (H) 01/07/2017 0758   CALCIUM 9.0 04/14/2018 0817   CALCIUM 9.3 01/07/2017 0758   GFRNONAA 35 (L) 04/14/2018 0817   GFRNONAA 30 (L) 12/16/2017 0806   GFRNONAA 55 (L) 02/18/2013 0827   GFRAA 40 (L)  04/14/2018 0817   GFRAA 35 (L) 12/16/2017 0806   GFRAA 63 02/18/2013 0827   Lab Results  Component Value Date   HGBA1C 5.8 (H) 09/03/2017   HGBA1C 6.1 (H) 03/11/2017   HGBA1C 8.2 (H) 11/05/2016   HGBA1C 8.1 (H) 08/09/2016   HGBA1C 6.5 (H) 01/03/2015   Lab Results  Component Value Date   INSULIN 15.3 03/11/2017   INSULIN 18.3 08/09/2016   CBC    Component Value Date/Time   WBC 2.6 (L) 04/14/2018 0817   RBC 3.01 (L) 04/14/2018 0817   HGB 9.5 (L) 04/14/2018 0817   HGB 10.6 (L) 12/16/2017 0806   HGB 10.8 (L) 01/07/2017 0758   HCT 29.3 (L) 04/14/2018 0817   HCT 31.9 (L) 01/07/2017 0758   PLT 131 (L) 04/14/2018 0817   PLT 83 (L) 12/16/2017 0806   PLT 126 (L) 01/07/2017 0758   MCV 97.3 04/14/2018 0817   MCV 90.8 01/07/2017 0758   MCH 31.6 04/14/2018 0817   MCHC 32.4 04/14/2018 0817   RDW 14.5 04/14/2018 0817   RDW 14.8 (H) 01/07/2017 0758   LYMPHSABS 0.8 04/14/2018 0817   LYMPHSABS 1.1 01/07/2017 0758   MONOABS 0.2 04/14/2018 0817   MONOABS 0.3 01/07/2017 0758   EOSABS 0.1 04/14/2018 0817   EOSABS 0.1 01/07/2017 0758   EOSABS  0.1 08/09/2016 1229   BASOSABS 0.0 04/14/2018 0817   BASOSABS 0.0 01/07/2017 0758   Iron/TIBC/Ferritin/ %Sat    Component Value Date/Time   IRON 58 02/18/2013 0827   TIBC 305 02/18/2013 0827   FERRITIN 133 10/31/2014 0816   IRONPCTSAT 19 (L) 02/18/2013 0827   Lipid Panel     Component Value Date/Time   CHOL 160 09/03/2017 1349   TRIG 111 09/03/2017 1349   HDL 59 09/03/2017 1349   CHOLHDL 2.9 02/18/2013 0827   VLDL 23 02/18/2013 0827   LDLCALC 79 09/03/2017 1349   Hepatic Function Panel     Component Value Date/Time   PROT 6.3 (L) 04/14/2018 0817   PROT 6.9 03/11/2017 0841   PROT 6.7 01/07/2017 0758   ALBUMIN 3.7 04/14/2018 0817   ALBUMIN 4.5 03/11/2017 0841   ALBUMIN 3.6 01/07/2017 0758   AST 42 (H) 04/14/2018 0817   AST 43 (H) 12/16/2017 0806   AST 51 (H) 01/07/2017 0758   ALT 75 (H) 04/14/2018 0817   ALT 57 (H) 12/16/2017  0806   ALT 82 (H) 01/07/2017 0758   ALKPHOS 141 (H) 04/14/2018 0817   ALKPHOS 115 01/07/2017 0758   BILITOT 0.6 04/14/2018 0817   BILITOT 0.6 12/16/2017 0806   BILITOT 0.72 01/07/2017 0758      Component Value Date/Time   TSH 1.54 02/07/2017   TSH 2.170 08/09/2016 1229      I, [name], am acting as transcriptionist for Dennard Nip, MD I have reviewed the above documentation for accuracy and completeness, and I agree with the above. -Dennard Nip, MD

## 2018-04-27 NOTE — Progress Notes (Signed)
[place]  [phone] [phone]   ID: [name] DOB: 1950/02/13  MR#: [id number]  NTZ#:[id number]  Patient Care Team: Vernie Shanks, MD as PCP - General (Family Medicine) Himmelrich, Bryson Ha, RD (Inactive) as Dietitian (Bariatrics) Stark Klein, MD as Consulting Physician (General Surgery) , Virgie Dad, MD as Consulting Physician (Oncology) Arloa Koh, MD as Consulting Physician (Radiation Oncology) Mauro Kaufmann, RN as Registered Nurse Rockwell Germany, RN as Registered Nurse Jacelyn Pi, MD as Consulting Physician (Endocrinology) Kem Boroughs, Berkey as Nurse Practitioner (Family Medicine) [name], MD as Consulting Physician (Plastic Surgery) Larey Dresser, MD as Consulting Physician (Cardiology) Gery Pray, MD as Consulting Physician (Radiation Oncology) Juanita Craver, MD as Consulting Physician (Gastroenterology)  OTHER MD:   CHIEF COMPLAINT:  HER-2 positive, estrogen receptor negative locally recurrent disease  CURRENT TREATMENT:  Trastuzumab; Abraxane   INTERVAL HISTORY:  Darlene [name] returns today for follow-up and treatment of her estrogen receptor negative but HER-2 amplified breast cancer.  She continues on trastuzumab.  She tolerates this with no side effects that she is aware of. She receives this every 3 weeks as opposed to every 4 so she doesn't have to come during her off week and to avoid appointment confusion regarding her different treatments.  She received together with abbreviated treatment each 20 mg cycle. She tolerates this well without any noticeable side effects.  She also continues on abraxane, receiving this on days 1 and 8 of a 21 day cycle.  Today is day 1 cycle 7. She states her energy is good, her bowels are returning to normal, and her hair is growing back. She denies any neuropathy and mouth issues.  [name]'s last echocardiogram was on 03/06/2018 showing an EF of 55-60%.     She is scheduled for PET scan on 05/14/2018.   REVIEW OF SYSTEMS: Darlene [name] reports she is busy preparing to move. She and her husband are moving to an [place] since her husband has back issues. Because of all this, she is not taking dedicated walks but is walking to and from the dumpster and recycling. The patient denies unusual headaches, visual changes, nausea, vomiting, stiff neck, dizziness, or gait imbalance. There has been no cough, phlegm production, or pleurisy, no chest pain or pressure, and no change in bowel or bladder habits. The patient denies fever, rash, bleeding, unexplained fatigue or unexplained weight loss. A detailed review of systems was otherwise entirely negative.   BREAST CANCER HISTORY: From the original intake note:  "Darlene [name]" had screening mammography at her gynecologist suggestive of a possible abnormality in the right breast. However right diagnostic mammogram 04/12/2013 at [facility] was negative. More recently however, she noted a lump in her right breast and brought it to her gynecologist's attention. On 07/18/2014 the patient had bilateral diagnostic mammography with tomosynthesis at [facility]. The breast density was category B. In the right breast upper outer quadrant there was an area of focal asymmetry with indistinct margins.[name] Ultrasound was obtained and showed in addition to the mass in question, measuring 1.7 cm, an abnormal-appearing lymph node in the right axillary tail.  On 07/19/2014 the patient underwent biopsy of the right breast massin question as well as a right axillary lymph node. Both were positive for invasive ductal carcinoma, grade 2, estrogen and progesterone receptor negative, with an MIB-1 of 90%, and HER-2 pending. Incidentally the lymph node biopsy showed a lymphocytic inflammatory component but no lymph node architecture.  Her subsequent history is as  detailed above.   PAST MEDICAL HISTORY: Past Medical History:  Diagnosis Date  . Anemia     hx of - during 1st round chemo  . Anxiety   . Arthritis    in fingers, shoulders  . Back pain   . Balance problem   . Breast cancer ([place])   . Breast cancer of upper-outer quadrant of right female breast ([place]) 07/22/2014  . Bunion, right foot   . Clumsiness   . Constipation   . Depression   . Diabetes mellitus without complication ([place])    98+ years  . Eating disorder    binge eating  . Epistaxis 08/26/2014  . Fatty liver   . HCAP (healthcare-associated pneumonia) 12/18/2014  . Headache   . Heart murmur    never had any problems  . History of radiation therapy 04/05/15-05/19/15   right chest wall was treated to 50.4 Gy in 28 fractions, right mastectomy scar was treated to 10 Gy in 5 fractions  . Hyperlipidemia   . Hypertension   . Joint pain   . Multiple food allergies   . Neuromuscular disorder ([place])    diabetic neuropathy  . Obesity   . Obstructive sleep apnea on CPAP    uses cpap nightly  . Ovarian cyst rupture    possible  . Pneumonia   . Pneumonia 11/2014  . Radiation 04/05/15-05/19/15   right chest wall 50.4 Gy, mastectomy scar 10 Gy  . Sleep apnea   . Thyroid nodule 06/2014    PAST SURGICAL HISTORY: Past Surgical History:  Procedure Laterality Date  . APPENDECTOMY    . BIOPSY THYROID Bilateral 06/2014   2 nodules - Benign   . BREAST LUMPECTOMY WITH RADIOACTIVE SEED AND SENTINEL LYMPH NODE BIOPSY Right 12/27/2014   Procedure: BREAST LUMPECTOMY WITH RADIOACTIVE SEED AND SENTINEL LYMPH NODE BIOPSY;  Surgeon: Stark Klein, MD;  Location: [facility];  Service: General;  Laterality: Right;  . BREAST REDUCTION SURGERY Bilateral 01/06/2015   Procedure: BILATERAL BREAST REDUCTION, RIGHT ONCOPLASTIC RECONSTRUCTION),LEFT BREAST REDUCTION FOR SYMMETRY;  Surgeon: [name], MD;  Location: [facility];  Service: Plastics;  Laterality: Bilateral;  . BREATH TEK H PYLORI N/A 09/15/2012   Procedure: BREATH TEK H PYLORI;  Surgeon: Pedro Earls, MD;  Location: [facility];  Service: General;  Laterality: N/A;  . BUNIONECTOMY Left 12/2013  . CARPAL TUNNEL RELEASE Right   . CARPAL TUNNEL RELEASE Left   . CATARACT EXTRACTION     6/19  . COLONOSCOPY    . DUPUYTREN CONTRACTURE RELEASE    . GASTRIC ROUX-EN-Y N/A 11/02/2012   Procedure: LAPAROSCOPIC ROUX-EN-Y GASTRIC;  Surgeon: Pedro Earls, MD;  Location[facility];  Service: General;  Laterality: N/A;  . IR GENERIC HISTORICAL  03/18/2016   IR US GUIDE VASC ACCESS LEFT 03/18/2016 Markus Daft, MD [facility]  . IR GENERIC HISTORICAL  03/18/2016   IR FLUORO GUIDE CV LINE LEFT 03/18/2016 Markus Daft, MD [facility]  . LAPAROSCOPIC APPENDECTOMY N/A 07/22/2015   Procedure: APPENDECTOMY LAPAROSCOPIC;  Surgeon: Michael Boston, MD;  [facility];  Service: General;  Laterality: N/A;  . MASTECTOMY Right 02/15/2015   modified  . MODIFIED MASTECTOMY Right 02/15/2015   Procedure: RIGHT MODIFIED RADICAL MASTECTOMY;  Surgeon: Stark Klein, MD;  Location: [facility];  Service: General;  Laterality: Right;  . PORT-A-CATH REMOVAL Left 10/10/2015   Procedure: PORT REMOVAL;  Surgeon: Stark Klein, MD;  Location: [facility];  Service: General;  Laterality: Left;  . PORTACATH  PLACEMENT Left 08/02/2014   Procedure: INSERTION PORT-A-CATH;  Surgeon: Stark Klein, MD;  Location: [facility];  Service: General;  Laterality: Left;  . PORTACATH PLACEMENT N/A 11/05/2016   Procedure: INSERTION PORT-A-CATH;  Surgeon: Stark Klein, MD;  Location: [facility];  Service: General;  Laterality: N/A;  . ROTATOR CUFF REPAIR Right 2005  . SCAR REVISION Right 12/08/2015   Procedure: COMPLEX REPAIR RIGHT CHEST 10-15CM;  Surgeon: [name], MD;  Location: [facility];  Service: Plastics;  Laterality: Right;  . TOE SURGERY  1970s   bone spur  . TRIGGER FINGER RELEASE     x3    FAMILY HISTORY Family History  Problem Relation Age of Onset  . CVA Mother   . Heart disease Mother   . Sudden death Mother   . Diabetes Father    . Heart failure Father   . Hypertension Father   . Kidney disease Father   . Obesity Father   . Hypertension Sister   . Diabetes Brother   . Breast cancer Paternal Grandmother 85  . Diabetes Paternal Uncle   . Ovarian cancer Neg Hx    The patient's father died from complications of diabetes at age 75. The patient's mother died at age 36 with a subarachnoid hemorrhage. The patient had one brother, one sister. The only breast cancer was the patient's paternal grandmother diagnosed in her 23s. There is no history of ovarian cancer in the family.   GYNECOLOGIC HISTORY:  Patient's last menstrual period was 10/22/2007.  menarche age 71, the patient is GX P0. She went through the change of life in 2011. She did not take hormone replacement.   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 Her husband Darlene [name] is a retired Visual merchandiser (he taught at [employer]).  Darlene [name] has 2 children from an earlier marriage, Darlene [name] who teaches in [place], and Darlene [name],  who works at the [employer] in in [place]. He has 1 grand son, aged 21. The patient and her husband are not church attenders.   ADVANCED DIRECTIVES:  Not in place   HEALTH MAINTENANCE: Social History   Tobacco Use  . Smoking status: Never Smoker  . Smokeless tobacco: Never Used  Substance Use Topics  . Alcohol use: Yes    Alcohol/week: 1.0 standard drinks    Types: 1 Standard drinks or equivalent per week    Comment: social  . Drug use: No     Colonoscopy:  May 2016  PAP:  Bone density: 07/01/2016 [facility] showed normal, with a T score of -0.7  Lipid panel:  Allergies  Allergen Reactions  . Nsaids Other (See Comments)    H/o gastric bypass - avoid NSAIDs  . Tape Hives, Rash and Other (See Comments)    Adhesives in bandaids    Current Outpatient Medications  Medication Sig  Dispense Refill  . acetaminophen (TYLENOL) 325 MG tablet Take 650 mg by mouth 2 (two) times daily as needed for moderate pain or headache.     [name] buPROPion (WELLBUTRIN SR) 200 MG 12 hr tablet TAKE 1 TABLET BY MOUTH DAILY AT 12 NOON. 30 tablet 0  . CALCIUM CITRATE-VITAMIN D PO Take 1 tablet by mouth 3 (three) times daily.     . cholecalciferol (VITAMIN D) 1000 units tablet Take 1,000 Units by mouth daily.    . Cyanocobalamin (  VITAMIN B-12 PO) Take 1 tablet by mouth every 14 (fourteen) days.    . diphenhydrAMINE (BENADRYL) 25 MG tablet Take 25 mg by mouth daily as needed for allergies or sleep.     . Insulin Glargine (LANTUS SOLOSTAR) 100 UNIT/ML Solostar Pen Inject 12 Units into the skin daily. 5 pen 0  . Insulin Pen Needle (BD PEN NEEDLE NANO U/F) 32G X 4 MM MISC 1 Package by Does not apply route daily at 12 noon. 100 each 0  . liraglutide (VICTOZA) 18 MG/3ML SOPN INJECT 1.8MG(0.3 MLS) INTO THE SKIN EVERY MORNING 3 pen 0  . loratadine (CLARITIN) 10 MG tablet Take 10 mg by mouth as needed for allergies.    [name] losartan (COZAAR) 100 MG tablet Take 1 tablet (100 mg total) by mouth daily. 30 tablet 0  . Melatonin 3 MG CAPS Take 3 mg by mouth at bedtime.     . metFORMIN (GLUCOPHAGE) 500 MG tablet Take 1 tablet (500 mg total) by mouth daily. 30 tablet 0  . ONETOUCH DELICA LANCETS 26E MISC 1 Package by Does not apply route daily. 100 each 0  . Pediatric Multivitamins-Iron (FLINTSTONES PLUS IRON) chewable tablet Chew 1 tablet by mouth 2 (two) times daily.    . Probiotic Product (CVS PROBIOTIC PO) Take 1 capsule by mouth daily.     . prochlorperazine (COMPAZINE) 10 MG tablet Take 1 tablet (10 mg total) by mouth every 6 (six) hours as needed for nausea or vomiting. 30 tablet 0  . Propylene Glycol (SYSTANE BALANCE OP) Place 1-2 drops into both eyes 3 (three) times daily as needed (for dry eyes).     [name] senna-docusate (SENNA PLUS) 8.6-50 MG tablet Take 1 tablet by mouth daily as needed for moderate constipation.      . topiramate (TOPAMAX) 25 MG tablet Take 1 tablet (25 mg total) by mouth daily. 30 tablet 0  . vortioxetine HBr (TRINTELLIX) 20 MG TABS tablet Take 1 tablet (20 mg total) by mouth at bedtime. 30 tablet 0   No current facility-administered medications for this visit.     OBJECTIVE: Latest white woman in no acute distress  Vitals:   04/28/18 0944  BP: (!) 140/54  Pulse: 67  Resp: 18  Temp: 98.5 F (36.9 C)  SpO2: 100%     Body mass index is 29.18 kg/m.    ECOG FS:1 - Symptomatic but completely ambulatory Filed Weights   04/28/18 0944  Weight: 197 lb 9.6 oz (89.6 kg)   Sclerae unicteric, EOMs intact Wearing a mask No cervical or supraclavicular adenopathy Lungs no rales or rhonchi Heart regular rate and rhythm Abd soft, nontender, positive bowel sounds MSK no focal spinal tenderness, no upper extremity lymphedema Neuro: nonfocal, well oriented, appropriate affect Breasts: The right breast is status post mastectomy with no evidence of peripheral recurrence.  The left breast is unremarkable.  Both axillae are benign.  LAB RESULTS:  CMP     Component Value Date/Time   NA 142 04/14/2018 0817   NA 143 03/11/2017 0841   NA 140 01/07/2017 0758   K 4.2 04/14/2018 0817   K 4.1 01/07/2017 0758   CL 113 (H) 04/14/2018 0817   CO2 20 (L) 04/14/2018 0817   CO2 24 01/07/2017 0758   GLUCOSE 164 (H) 04/14/2018 0817   GLUCOSE 134 01/07/2017 0758   BUN 26 (H) 04/14/2018 0817   BUN 40 (H) 03/11/2017 0841   BUN 33.6 (H) 01/07/2017 0758   CREATININE 1.56 (H) 04/14/2018 1583  CREATININE 1.74 (H) 12/16/2017 0806   CREATININE 1.5 (H) 01/07/2017 0758   CALCIUM 9.0 04/14/2018 0817   CALCIUM 9.3 01/07/2017 0758   PROT 6.3 (L) 04/14/2018 0817   PROT 6.9 03/11/2017 0841   PROT 6.7 01/07/2017 0758   ALBUMIN 3.7 04/14/2018 0817   ALBUMIN 4.5 03/11/2017 0841   ALBUMIN 3.6 01/07/2017 0758   AST 42 (H) 04/14/2018 0817   AST 43 (H) 12/16/2017 0806   AST 51 (H) 01/07/2017 0758   ALT 75  (H) 04/14/2018 0817   ALT 57 (H) 12/16/2017 0806   ALT 82 (H) 01/07/2017 0758   ALKPHOS 141 (H) 04/14/2018 0817   ALKPHOS 115 01/07/2017 0758   BILITOT 0.6 04/14/2018 0817   BILITOT 0.6 12/16/2017 0806   BILITOT 0.72 01/07/2017 0758   GFRNONAA 35 (L) 04/14/2018 0817   GFRNONAA 30 (L) 12/16/2017 0806   GFRNONAA 55 (L) 02/18/2013 0827   GFRAA 40 (L) 04/14/2018 0817   GFRAA 35 (L) 12/16/2017 0806   GFRAA 63 02/18/2013 0827    INo results found for: SPEP, UPEP  Lab Results  Component Value Date   WBC 2.9 (L) 04/28/2018   NEUTROABS 1.7 04/28/2018   HGB 9.8 (L) 04/28/2018   HCT 30.6 (L) 04/28/2018   MCV 98.4 04/28/2018   PLT 137 (L) 04/28/2018      Chemistry      Component Value Date/Time   NA 142 04/14/2018 0817   NA 143 03/11/2017 0841   NA 140 01/07/2017 0758   K 4.2 04/14/2018 0817   K 4.1 01/07/2017 0758   CL 113 (H) 04/14/2018 0817   CO2 20 (L) 04/14/2018 0817   CO2 24 01/07/2017 0758   BUN 26 (H) 04/14/2018 0817   BUN 40 (H) 03/11/2017 0841   BUN 33.6 (H) 01/07/2017 0758   CREATININE 1.56 (H) 04/14/2018 0817   CREATININE 1.74 (H) 12/16/2017 0806   CREATININE 1.5 (H) 01/07/2017 0758      Component Value Date/Time   CALCIUM 9.0 04/14/2018 0817   CALCIUM 9.3 01/07/2017 0758   ALKPHOS 141 (H) 04/14/2018 0817   ALKPHOS 115 01/07/2017 0758   AST 42 (H) 04/14/2018 0817   AST 43 (H) 12/16/2017 0806   AST 51 (H) 01/07/2017 0758   ALT 75 (H) 04/14/2018 0817   ALT 57 (H) 12/16/2017 0806   ALT 82 (H) 01/07/2017 0758   BILITOT 0.6 04/14/2018 0817   BILITOT 0.6 12/16/2017 0806   BILITOT 0.72 01/07/2017 0758       No results found for: LABCA2  No components found for: GXQJJ941  No results for input(s): INR in the last 168 hours.  Urinalysis    Component Value Date/Time   COLORURINE YELLOW 07/21/2015 [place] 07/21/2015 2328   LABSPEC 1.020 07/21/2015 2328   PHURINE 6.0 07/21/2015 2328   GLUCOSEU NEGATIVE 07/21/2015 2328   HGBUR TRACE  (A) 07/21/2015 2328   BILIRUBINUR NEGATIVE 07/21/2015 2328   BILIRUBINUR neg 06/29/2013 1017   KETONESUR NEGATIVE 07/21/2015 2328   PROTEINUR 30 (A) 07/21/2015 2328   UROBILINOGEN negative 06/29/2013 1017   NITRITE NEGATIVE 07/21/2015 2328   LEUKOCYTESUR NEGATIVE 07/21/2015 2328    STUDIES: No results found.   ASSESSMENT: 68 y.o.  woman status post right breast upper outer quadrant and right axillary lymph node biopsy 07/19/2014 for a cT2  pN1, stage IIB  invasive ductal carcinoma, grade 2, estrogen and progesterone receptor negative, with an MIB-1 of 90%, and HER-2 amplified  (1) neoadjuvant chemotherapy  started 08/08/2014, consisting of carboplatin, docetaxel, trastuzumab and pertuzumab given every 3 weeks 6, completed 11/21/2014  (a) breast MRI after initial 3 cycles shows a significant response  (b) breast MRI after 6 cycles shows a complete radiologic response   (2) trastuzumab continued through 10/02/2015 (to end of capecitabine therapy)  (a) echo 02/13/2015 shows an EF of 60-65%  (b)  echo 05/09/2015 shows an ejection fraction of 60-65%  (c) echocardiogram 06/27/2015 shows an ejection fraction of 55-60%.  (d) echocardiogram 01/22/2017 shows an ejection fraction of 55-60%  (3) right lumpectomy with sentinel lymph node biopsy on 12/27/14 showed a residual  ypT2 ypN1a, stage IIB invasive ductal carcinoma, grade 2, with repeat estrogen and progesterone receptors again negative  (4) status post right oncoplastic surgery (with left reduction mammoplasty) 01/06/2015 showing in the right breast multiple foci of intralymphatic carcinoma in random sections  (5)  Right modified radical mastectomy 02/15/2014 showed no residual invasive disease; there was DCIS but margins were negative; all 15 axillary lymph nodes were clear; ypTis ypN0  (a)  The patient has decided against reconstruction.  (6)  Postmastectomy radiation completed 05/18/2015 with capecitabine sensitization  (7)  adjuvant capecitabine continued through 09/27/2015  (8) hepatic steatosis with increased fibrosis score (at risk for cirrhosis)  (9) thyroid biopsy (left lobe inferiorly) 2 shows benign tissue 06/28/2015  (10) LOCAL RECURRENCE:  (a) PET scan 12/06/2015 Notes a right retropectoral lymph node measuring 0.9 cm with an SUV max of 5.5  (b) repeat PET scan 03/11/2016 finds the right subpectoral lymph node to now measure 1.3 cm with an SUV max of 11.8.  (c) PET scan 05/07/2016 shows a significant response to T-DM 1  (d) PET scan 07/10/2017 is clear except for what appears to be a single new liver lesion   (11) started T-DM1 03/21/2016, completed 8 doses 08/13/2016  (a) PET scan 08/19/2016 shows a good response but measurable residual disease  (b) PET scan on 01/16/2017 shows a continuing response  (12) maintenance trastuzumab started 09/03/2016--changed to every 28 days as of 01/28/2017  (a) repeat echocardiogram 07/08/2016 shows an ejection fraction of 60-65%.  (b) echocardiogram on 10/08/2016 demonstrates LVEF of 55-60% (followed by Dr. Haroldine Laws)  (c) echo 01/22/2017 shows an ejection fraction in the 55-60% range  (d) echo 04/24/2017 shows an ejection fraction in the 55-60% range (recommendation for echo every 6 months by Dr. Aundra Dubin)  (e) trastuzumab changed to T-DM 1 after trastuzumab dose 07/15/2017  METASTATIC DISEASE: July 2019 (13)  Radiation to the area of persistent disease in the right upper chest completed 10/30/2016   (14) T-DM 1 resumed 08/12/2017, repeated every 21 days  (a) baseline liver MRI 08/02/2017 shows a 1.4 cm mass, "hot" on PET 07/10/2017  (b) repeat liver MRI 10/22/2018 3:19 cycles of T-DM 1, shows slight enlargement  (c) T-DM 1 discontinued after 09/23/2017 dose  (15) trastuzumab resumed 11/18/2017, to be repeated every 4 weeks, changed back to every 3 weeks in 03/2018  (16) liver biopsy 11/03/2017 shows adenocarcinoma, most consistent with an upper  gastrointestinal primary; this tumor was estrogen receptor and HER-2 negative  (a) GI workup for occult primary Collene Mares) negative  (b) PET 12/09/2017 shows no obvious primary.  There is a right subpectoral lymph node noted as well  (c) CA 27-29 and CA 19-9 uninformative; CEA pending   (17) to start Abraxane on 12/10, given days 1 and 8 on a 21 day cycle x 3  (a)restaging with MRI liver 02/24/2018 shows a gratifying initial response  (  b) PET scan 05/14/2018  (18) consider Yttrium or other local treatment to liver per IR   PLAN: Darlene [name] is tolerating her treatment remarkably well and we are continuing with the Abraxane and trastuzumab as before.  She will need a repeat echocardiogram in late May have abated.  She will have a repeat PET scan before the start of the next cycle.  She will meet with me on day 1 of that cycle to discuss results.  We will make a decision at that time whether she should proceed to atrium treatment or continue with systemic therapy and if so what that should be.  She is very aware of the distance he needs and she and her husband are taking appropriate precautions  She knows to call for any other issue that may develop before the next visit.  Virgie Dad. , MD  04/28/18 10:02 AM Medical Oncology and Hematology [facility] [address] [place], [place] [address] Tel. [phone]    Fax. [phone]   I, Wilburn Mylar, am acting as scribe for Dr. Virgie Dad. .  I, Lurline Del MD, have reviewed the above documentation for accuracy and completeness, and I agree with the above.

## 2018-04-28 ENCOUNTER — Inpatient Hospital Stay: Payer: Medicare Other

## 2018-04-28 ENCOUNTER — Telehealth: Payer: Self-pay | Admitting: Oncology

## 2018-04-28 ENCOUNTER — Other Ambulatory Visit: Payer: Self-pay

## 2018-04-28 ENCOUNTER — Inpatient Hospital Stay (HOSPITAL_BASED_OUTPATIENT_CLINIC_OR_DEPARTMENT_OTHER): Payer: Medicare Other | Admitting: Oncology

## 2018-04-28 ENCOUNTER — Inpatient Hospital Stay: Payer: Medicare Other | Attending: Oncology

## 2018-04-28 ENCOUNTER — Telehealth: Payer: Self-pay | Admitting: *Deleted

## 2018-04-28 VITALS — BP 140/54 | HR 67 | Temp 98.5°F | Resp 18 | Ht 69.0 in | Wt 197.6 lb

## 2018-04-28 DIAGNOSIS — C50911 Malignant neoplasm of unspecified site of right female breast: Secondary | ICD-10-CM

## 2018-04-28 DIAGNOSIS — C50411 Malignant neoplasm of upper-outer quadrant of right female breast: Secondary | ICD-10-CM

## 2018-04-28 DIAGNOSIS — Z5112 Encounter for antineoplastic immunotherapy: Secondary | ICD-10-CM | POA: Diagnosis present

## 2018-04-28 DIAGNOSIS — C787 Secondary malignant neoplasm of liver and intrahepatic bile duct: Secondary | ICD-10-CM

## 2018-04-28 DIAGNOSIS — Z171 Estrogen receptor negative status [ER-]: Secondary | ICD-10-CM

## 2018-04-28 DIAGNOSIS — Z95828 Presence of other vascular implants and grafts: Secondary | ICD-10-CM

## 2018-04-28 DIAGNOSIS — E119 Type 2 diabetes mellitus without complications: Secondary | ICD-10-CM

## 2018-04-28 DIAGNOSIS — G629 Polyneuropathy, unspecified: Secondary | ICD-10-CM | POA: Diagnosis not present

## 2018-04-28 DIAGNOSIS — Z5111 Encounter for antineoplastic chemotherapy: Secondary | ICD-10-CM | POA: Insufficient documentation

## 2018-04-28 DIAGNOSIS — C801 Malignant (primary) neoplasm, unspecified: Secondary | ICD-10-CM

## 2018-04-28 LAB — CMP (CANCER CENTER ONLY)
ALT: 61 U/L — ABNORMAL HIGH (ref 0–44)
AST: 43 U/L — ABNORMAL HIGH (ref 15–41)
Albumin: 3.8 g/dL (ref 3.5–5.0)
Alkaline Phosphatase: 122 U/L (ref 38–126)
Anion gap: 7 (ref 5–15)
BUN: 28 mg/dL — ABNORMAL HIGH (ref 8–23)
CO2: 22 mmol/L (ref 22–32)
Calcium: 9 mg/dL (ref 8.9–10.3)
Chloride: 112 mmol/L — ABNORMAL HIGH (ref 98–111)
Creatinine: 1.62 mg/dL — ABNORMAL HIGH (ref 0.44–1.00)
GFR, Est AFR Am: 38 mL/min — ABNORMAL LOW (ref >60)
GFR, Estimated: 33 mL/min — ABNORMAL LOW (ref >60)
Glucose, Bld: 152 mg/dL — ABNORMAL HIGH (ref 70–99)
Potassium: 4 mmol/L (ref 3.5–5.1)
Sodium: 141 mmol/L (ref 135–145)
Total Bilirubin: 0.6 mg/dL (ref 0.3–1.2)
Total Protein: 6.5 g/dL (ref 6.5–8.1)

## 2018-04-28 LAB — CBC WITH DIFFERENTIAL/PLATELET
Abs Immature Granulocytes: 0.02 10*3/uL (ref 0.00–0.07)
Basophils Absolute: 0 10*3/uL (ref 0.0–0.1)
Basophils Relative: 1 %
Eosinophils Absolute: 0 10*3/uL (ref 0.0–0.5)
Eosinophils Relative: 1 %
HCT: 30.6 % — ABNORMAL LOW (ref 36.0–46.0)
Hemoglobin: 9.8 g/dL — ABNORMAL LOW (ref 12.0–15.0)
Immature Granulocytes: 1 %
Lymphocytes Relative: 27 %
Lymphs Abs: 0.8 10*3/uL (ref 0.7–4.0)
MCH: 31.5 pg (ref 26.0–34.0)
MCHC: 32 g/dL (ref 30.0–36.0)
MCV: 98.4 fL (ref 80.0–100.0)
Monocytes Absolute: 0.3 10*3/uL (ref 0.1–1.0)
Monocytes Relative: 11 %
Neutro Abs: 1.7 10*3/uL (ref 1.7–7.7)
Neutrophils Relative %: 59 %
Platelets: 137 10*3/uL — ABNORMAL LOW (ref 150–400)
RBC: 3.11 MIL/uL — ABNORMAL LOW (ref 3.87–5.11)
RDW: 14.9 % (ref 11.5–15.5)
WBC: 2.9 10*3/uL — ABNORMAL LOW (ref 4.0–10.5)
nRBC: 0 % (ref 0.0–0.2)

## 2018-04-28 MED ORDER — PACLITAXEL PROTEIN-BOUND CHEMO INJECTION 100 MG
80.0000 mg/m2 | Freq: Once | INTRAVENOUS | Status: AC
  Administered 2018-04-28: 175 mg via INTRAVENOUS
  Filled 2018-04-28: qty 35

## 2018-04-28 MED ORDER — SODIUM CHLORIDE 0.9% FLUSH
10.0000 mL | INTRAVENOUS | Status: DC | PRN
  Administered 2018-04-28: 10 mL via INTRAVENOUS
  Filled 2018-04-28: qty 10

## 2018-04-28 MED ORDER — PROCHLORPERAZINE MALEATE 10 MG PO TABS: 10.0000 mg | ORAL_TABLET | Freq: Once | ORAL | Status: DC

## 2018-04-28 MED ORDER — HEPARIN SOD (PORK) LOCK FLUSH 100 UNIT/ML IV SOLN
500.0000 [IU] | Freq: Once | INTRAVENOUS | Status: AC | PRN
  Administered 2018-04-28: 500 [IU]
  Filled 2018-04-28: qty 5

## 2018-04-28 MED ORDER — SODIUM CHLORIDE 0.9% FLUSH
10.0000 mL | INTRAVENOUS | Status: DC | PRN
  Administered 2018-04-28: 10 mL
  Filled 2018-04-28: qty 10

## 2018-04-28 MED ORDER — SODIUM CHLORIDE 0.9 % IV SOLN
Freq: Once | INTRAVENOUS | Status: AC
  Administered 2018-04-28: 11:00:00 via INTRAVENOUS
  Filled 2018-04-28: qty 250

## 2018-04-28 NOTE — Telephone Encounter (Signed)
Per md ok to treat with creatinine of 1.6

## 2018-04-28 NOTE — Telephone Encounter (Signed)
No 4/7 los

## 2018-05-04 ENCOUNTER — Encounter (INDEPENDENT_AMBULATORY_CARE_PROVIDER_SITE_OTHER): Payer: Self-pay | Admitting: Family Medicine

## 2018-05-04 ENCOUNTER — Other Ambulatory Visit: Payer: Self-pay

## 2018-05-04 ENCOUNTER — Ambulatory Visit (INDEPENDENT_AMBULATORY_CARE_PROVIDER_SITE_OTHER): Payer: Medicare Other | Admitting: Family Medicine

## 2018-05-04 DIAGNOSIS — F3289 Other specified depressive episodes: Secondary | ICD-10-CM

## 2018-05-04 DIAGNOSIS — I10 Essential (primary) hypertension: Secondary | ICD-10-CM

## 2018-05-04 DIAGNOSIS — E119 Type 2 diabetes mellitus without complications: Secondary | ICD-10-CM

## 2018-05-04 DIAGNOSIS — Z794 Long term (current) use of insulin: Secondary | ICD-10-CM

## 2018-05-04 DIAGNOSIS — Z6830 Body mass index (BMI) 30.0-30.9, adult: Secondary | ICD-10-CM

## 2018-05-04 DIAGNOSIS — E669 Obesity, unspecified: Secondary | ICD-10-CM | POA: Diagnosis not present

## 2018-05-04 MED ORDER — LIRAGLUTIDE 18 MG/3ML ~~LOC~~ SOPN: PEN_INJECTOR | SUBCUTANEOUS | 0 refills | Status: DC

## 2018-05-04 MED ORDER — METFORMIN HCL 500 MG PO TABS: 500.0000 mg | ORAL_TABLET | Freq: Every day | ORAL | 0 refills | Status: DC

## 2018-05-04 MED ORDER — VORTIOXETINE HBR 20 MG PO TABS: 20.0000 mg | ORAL_TABLET | Freq: Every day | ORAL | 0 refills | Status: DC

## 2018-05-04 MED ORDER — LOSARTAN POTASSIUM 100 MG PO TABS: 100.0000 mg | ORAL_TABLET | Freq: Every day | ORAL | 0 refills | Status: DC

## 2018-05-04 MED ORDER — BUPROPION HCL ER (SR) 200 MG PO TB12: ORAL_TABLET | ORAL | 0 refills | Status: DC

## 2018-05-04 MED ORDER — TOPIRAMATE 25 MG PO TABS: 25.0000 mg | ORAL_TABLET | Freq: Every day | ORAL | 0 refills | Status: DC

## 2018-05-04 MED ORDER — INSULIN PEN NEEDLE 32G X 4 MM MISC: 1.0000 | Freq: Every day | 0 refills | Status: DC

## 2018-05-04 MED ORDER — INSULIN GLARGINE 100 UNIT/ML SOLOSTAR PEN: 12.0000 [IU] | PEN_INJECTOR | Freq: Every day | SUBCUTANEOUS | 0 refills | Status: DC

## 2018-05-04 NOTE — Progress Notes (Signed)
Office: [phone]  /  Fax: [phone] TeleHealth Visit:  Darlene [name] has verbally consented to this TeleHealth visit today. The patient is located at home, the provider is located at the [facility] office. The participants in this visit include the listed provider and patient. The visit was conducted today via Face Time.  HPI:   Chief Complaint: OBESITY Darlene [name] is here to discuss her progress with her obesity treatment plan. She is keeping a food journal with 1300 to 1500 calories and 80 grams of protein and is following her eating plan approximately 70 % of the time. She states she is exercising 0 minutes 0 times per week. Darlene [name] is in good spirits. She is maintaining her weight while undergoing breast cancer treatment. She is getting better support from her husband, who is helping her eat healthier and doing the grocery shopping. Her appetite is appropriate.  We were unable to weigh the patient today for this TeleHealth visit. She feels as if she has maintained weight since her last visit. She has lost 30 lbs since starting treatment with [name].  Diabetes II Darlene [name] has a diagnosis of diabetes type II that is well controlled on medications. [facility] states that she is doing well on her diet and denies any hypoglycemic episodes. Her last A1c was 5.8 on 09/03/17. She has been working on intensive lifestyle modifications including diet, exercise, and weight loss to help control her blood glucose levels.  Hypertension Darlene [name] is a 68 y.o. female with hypertension. Darlene [name]'s blood pressure has been stable on medications and her diet for a while. She does not check her blood sugar at home. She is working on weight loss to help control her blood pressure with the goal of decreasing her risk of heart attack and stroke. Darlene [name] denies chest pain, lightheadedness, or headaches.  Depression with emotional eating behaviors Darlene [name]'s mood is stable on  medications. She is sleeping well and seems to be dealing well with her cancer treatments. She is struggling with emotional eating and using food for comfort to the extent that it is negatively impacting her health. She often snacks when she is not hungry. Darlene [name] sometimes feels she is out of control and then feels guilty that she made poor food choices. She has been working on behavior modification techniques to help reduce her emotional eating and has been somewhat successful. Darlene [name] denies palpitations and signs of serotonin syndrome. She shows no sign of suicidal or homicidal ideations.  ASSESSMENT AND PLAN:  Type 2 diabetes mellitus without complication, with long-term current use of insulin (HCC) - Plan: liraglutide (VICTOZA) 18 MG/3ML SOPN, metFORMIN (GLUCOPHAGE) 500 MG tablet, Insulin Pen Needle (BD PEN NEEDLE NANO U/F) 32G X 4 MM MISC, Insulin Glargine (LANTUS SOLOSTAR) 100 UNIT/ML Solostar Pen  Essential hypertension - Plan: losartan (COZAAR) 100 MG tablet  Other depression - with emotional eating - Plan: topiramate (TOPAMAX) 25 MG tablet, vortioxetine HBr (TRINTELLIX) 20 MG TABS tablet, buPROPion (WELLBUTRIN SR) 200 MG 12 hr tablet  Class 1 obesity with serious comorbidity and body mass index (BMI) of 30.0 to 30.9 in adult, unspecified obesity type - Starting BMI greater then 30  PLAN:  Diabetes II Darlene [name] has been given extensive diabetes education by myself today including ideal fasting and post-prandial blood glucose readings, individual ideal Hgb A1c goals, and hypoglycemia prevention. We discussed the importance of good blood sugar control to decrease the likelihood of diabetic complications such as nephropathy, neuropathy, limb loss, blindness, coronary artery disease, and  death. We discussed the importance of intensive lifestyle modification including diet, exercise, and weight loss as the first line treatment for diabetes. Darlene [name] agrees to continue her Insulin Glargine 12  units qd # 5 pens, Victoza 1.8 mg qd # 3 pens with pen needles #100, and metformin 500 mg qd #30 with no refills. Darlene [name] will follow up at the agreed upon time in 3 weeks.  Hypertension We discussed sodium restriction, working on healthy weight loss, and a regular exercise program as the means to achieve improved blood pressure control. We will continue to monitor her blood pressure as well as her progress with the above lifestyle modifications. She will continue her losartan 100 mg qd #30 with no refills and she will watch for signs of hypotension as she continues her lifestyle modifications. Darlene [name] agreed with this plan and agreed to follow up as directed.  Depression with Emotional Eating Behaviors We discussed behavior modification techniques today to help Darlene [name] deal with her emotional eating and depression. She has agreed to continue to take Wellbutrin SR 200 mg qd at noon # 30, Trintellix 20 mg qhs #30, and topamax 25 mg qd # 30 with no refills. Tim was offered support and encouragement and she agreed to follow up as directed.  Obesity Darlene [name] is currently in the action stage of change. As such, her goal is to continue with weight loss efforts. She has agreed to keep a food journal with 1300 to 1500 calories and 80+ grams of protein.  Darlene [name] has been instructed to work up to a goal of 150 minutes of combined cardio and strengthening exercise per week for weight loss and overall health benefits. We discussed the following Behavioral Modification Strategies today: work on meal planning and easy cooking plans, emotional eating strategies, keeping healthy foods in the home, and ways to avoid boredom eating.  Darlene [name] has agreed to follow up with our clinic in 3 weeks. She was informed of the importance of frequent follow up visits to maximize her success with intensive lifestyle modifications for her multiple health conditions.  ALLERGIES: Allergies  Allergen Reactions   Nsaids  Other (See Comments)    H/o gastric bypass - avoid NSAIDs   Tape Hives, Rash and Other (See Comments)    Adhesives in bandaids    MEDICATIONS: Current Outpatient Medications on File Prior to Visit  Medication Sig Dispense Refill   acetaminophen (TYLENOL) 325 MG tablet Take 650 mg by mouth 2 (two) times daily as needed for moderate pain or headache.      CALCIUM CITRATE-VITAMIN D PO Take 1 tablet by mouth 3 (three) times daily.      cholecalciferol (VITAMIN D) 1000 units tablet Take 1,000 Units by mouth daily.     Cyanocobalamin (VITAMIN B-12 PO) Take 1 tablet by mouth every 14 (fourteen) days.     diphenhydrAMINE (BENADRYL) 25 MG tablet Take 25 mg by mouth daily as needed for allergies or sleep.      loratadine (CLARITIN) 10 MG tablet Take 10 mg by mouth as needed for allergies.     Melatonin 3 MG CAPS Take 3 mg by mouth at bedtime.      ONETOUCH DELICA LANCETS 93T MISC 1 Package by Does not apply route daily. 100 each 0   Pediatric Multivitamins-Iron (FLINTSTONES PLUS IRON) chewable tablet Chew 1 tablet by mouth 2 (two) times daily.     Probiotic Product ([employer] PROBIOTIC PO) Take 1 capsule by mouth daily.      prochlorperazine (COMPAZINE) 10 MG  tablet Take 1 tablet (10 mg total) by mouth every 6 (six) hours as needed for nausea or vomiting. 30 tablet 0   Propylene Glycol (SYSTANE BALANCE OP) Place 1-2 drops into both eyes 3 (three) times daily as needed (for dry eyes).      senna-docusate (SENNA PLUS) 8.6-50 MG tablet Take 1 tablet by mouth daily as needed for moderate constipation.      No current facility-administered medications on file prior to visit.     PAST MEDICAL HISTORY: Past Medical History:  Diagnosis Date   Anemia    hx of - during 1st round chemo   Anxiety    Arthritis    in fingers, shoulders   Back pain    Balance problem    Breast cancer ([place])    Breast cancer of upper-outer quadrant of right female breast ([place]) 07/22/2014   Bunion, right foot     Clumsiness    Constipation    Depression    Diabetes mellitus without complication ([place])    21+ years   Eating disorder    binge eating   Epistaxis 08/26/2014   Fatty liver    HCAP (healthcare-associated pneumonia) 12/18/2014   Headache    Heart murmur    never had any problems   History of radiation therapy 04/05/15-05/19/15   right chest wall was treated to 50.4 Gy in 28 fractions, right mastectomy scar was treated to 10 Gy in 5 fractions   Hyperlipidemia    Hypertension    Joint pain    Multiple food allergies    Neuromuscular disorder ([place])    diabetic neuropathy   Obesity    Obstructive sleep apnea on CPAP    uses cpap nightly   Ovarian cyst rupture    possible   Pneumonia    Pneumonia 11/2014   Radiation 04/05/15-05/19/15   right chest wall 50.4 Gy, mastectomy scar 10 Gy   Sleep apnea    Thyroid nodule 06/2014    PAST SURGICAL HISTORY: Past Surgical History:  Procedure Laterality Date   APPENDECTOMY     BIOPSY THYROID Bilateral 06/2014   2 nodules - Benign    BREAST LUMPECTOMY WITH RADIOACTIVE SEED AND SENTINEL LYMPH NODE BIOPSY Right 12/27/2014   Procedure: BREAST LUMPECTOMY WITH RADIOACTIVE SEED AND SENTINEL LYMPH NODE BIOPSY;  Surgeon: Stark Klein, MD;  Location: [facility];  Service: General;  Laterality: Right;   BREAST REDUCTION SURGERY Bilateral 01/06/2015   Procedure: BILATERAL BREAST REDUCTION, RIGHT ONCOPLASTIC RECONSTRUCTION),LEFT BREAST REDUCTION FOR SYMMETRY;  Surgeon: [name], MD;  Location: [facility];  Service: Plastics;  Laterality: Bilateral;   BREATH TEK H PYLORI N/A 09/15/2012   Procedure: BREATH TEK H PYLORI;  Surgeon: Pedro Earls, MD;  Location: [facility];  Service: General;  Laterality: N/A;   BUNIONECTOMY Left 12/2013   CARPAL TUNNEL RELEASE Right    CARPAL TUNNEL RELEASE Left    CATARACT EXTRACTION     6/19   COLONOSCOPY     DUPUYTREN CONTRACTURE RELEASE     GASTRIC ROUX-EN-Y  N/A 11/02/2012   Procedure: LAPAROSCOPIC ROUX-EN-Y GASTRIC;  Surgeon: Pedro Earls, MD;  Location: [facility];  Service: General;  Laterality: N/A;   IR GENERIC HISTORICAL  03/18/2016   IR US GUIDE VASC ACCESS LEFT 03/18/2016 Markus Daft, MD [facility]   IR GENERIC HISTORICAL  03/18/2016   IR FLUORO GUIDE CV LINE LEFT 03/18/2016 Markus Daft, MD [facility]   LAPAROSCOPIC APPENDECTOMY N/A 07/22/2015   Procedure: APPENDECTOMY LAPAROSCOPIC;  Surgeon: Michael Boston, MD;  Location: [facility];  Service: General;  Laterality: N/A;   MASTECTOMY Right 02/15/2015   modified   MODIFIED MASTECTOMY Right 02/15/2015   Procedure: RIGHT MODIFIED RADICAL MASTECTOMY;  Surgeon: Stark Klein, MD;  Location: [facility];  Service: General;  Laterality: Right;   PORT-A-CATH REMOVAL Left 10/10/2015   Procedure: PORT REMOVAL;  Surgeon: Stark Klein, MD;  Location: [facility];  Service: General;  Laterality: Left;   PORTACATH PLACEMENT Left 08/02/2014   Procedure: INSERTION PORT-A-CATH;  Surgeon: Stark Klein, MD;  Location: [place] [facility];  Service: General;  Laterality: Left;   PORTACATH PLACEMENT N/A 11/05/2016   Procedure: INSERTION PORT-A-CATH;  Surgeon: Stark Klein, MD;  Location: [facility];  Service: General;  Laterality: N/A;   ROTATOR CUFF REPAIR Right 2005   SCAR REVISION Right 12/08/2015   Procedure: COMPLEX REPAIR RIGHT CHEST 10-15CM;  Surgeon: [name], MD;  Location: [facility];  Service: Plastics;  Laterality: Right;   TOE SURGERY  1970s   bone spur   TRIGGER FINGER RELEASE     x3    SOCIAL HISTORY: Social History   Tobacco Use   Smoking status: Never Smoker   Smokeless tobacco: Never Used  Substance Use Topics   Alcohol use: Yes    Alcohol/week: 1.0 standard drinks    Types: 1 Standard drinks or equivalent per week    Comment: social   Drug use: No    FAMILY HISTORY: Family History  Problem Relation Age of Onset   CVA Mother    Heart disease Mother     Sudden death Mother    Diabetes Father    Heart failure Father    Hypertension Father    Kidney disease Father    Obesity Father    Hypertension Sister    Diabetes Brother    Breast cancer Paternal Grandmother 72   Diabetes Paternal Uncle    Ovarian cancer Neg Hx     ROS: Review of Systems  Cardiovascular: Negative for chest pain and palpitations.  Neurological: Negative for headaches.       Negative for lightheadedness.  Endo/Heme/Allergies:       Negative for hypoglycemia.  Psychiatric/Behavioral: Positive for depression.       Negative for serotonin syndrome.    PHYSICAL EXAM: Pt in no acute distress  RECENT LABS AND TESTS: BMET    Component Value Date/Time   NA 141 04/28/2018 0915   NA 143 03/11/2017 0841   NA 140 01/07/2017 0758   K 4.0 04/28/2018 0915   K 4.1 01/07/2017 0758   CL 112 (H) 04/28/2018 0915   CO2 22 04/28/2018 0915   CO2 24 01/07/2017 0758   GLUCOSE 152 (H) 04/28/2018 0915   GLUCOSE 134 01/07/2017 0758   BUN 28 (H) 04/28/2018 0915   BUN 40 (H) 03/11/2017 0841   BUN 33.6 (H) 01/07/2017 0758   CREATININE 1.62 (H) 04/28/2018 0915   CREATININE 1.5 (H) 01/07/2017 0758   CALCIUM 9.0 04/28/2018 0915   CALCIUM 9.3 01/07/2017 0758   GFRNONAA 33 (L) 04/28/2018 0915   GFRNONAA 55 (L) 02/18/2013 0827   GFRAA 38 (L) 04/28/2018 0915   GFRAA 63 02/18/2013 0827   Lab Results  Component Value Date   HGBA1C 5.8 (H) 09/03/2017   HGBA1C 6.1 (H) 03/11/2017   HGBA1C 8.2 (H) 11/05/2016   HGBA1C 8.1 (H) 08/09/2016   HGBA1C 6.5 (H) 01/03/2015   Lab Results  Component Value Date   INSULIN 15.3 03/11/2017  INSULIN 18.3 08/09/2016   CBC    Component Value Date/Time   WBC 2.9 (L) 04/28/2018 0915   RBC 3.11 (L) 04/28/2018 0915   HGB 9.8 (L) 04/28/2018 0915   HGB 10.6 (L) 12/16/2017 0806   HGB 10.8 (L) 01/07/2017 0758   HCT 30.6 (L) 04/28/2018 0915   HCT 31.9 (L) 01/07/2017 0758   PLT 137 (L) 04/28/2018 0915   PLT 83 (L) 12/16/2017 0806    PLT 126 (L) 01/07/2017 0758   MCV 98.4 04/28/2018 0915   MCV 90.8 01/07/2017 0758   MCH 31.5 04/28/2018 0915   MCHC 32.0 04/28/2018 0915   RDW 14.9 04/28/2018 0915   RDW 14.8 (H) 01/07/2017 0758   LYMPHSABS 0.8 04/28/2018 0915   LYMPHSABS 1.1 01/07/2017 0758   MONOABS 0.3 04/28/2018 0915   MONOABS 0.3 01/07/2017 0758   EOSABS 0.0 04/28/2018 0915   EOSABS 0.1 01/07/2017 0758   EOSABS 0.1 08/09/2016 1229   BASOSABS 0.0 04/28/2018 0915   BASOSABS 0.0 01/07/2017 0758   Iron/TIBC/Ferritin/ %Sat    Component Value Date/Time   IRON 58 02/18/2013 0827   TIBC 305 02/18/2013 0827   FERRITIN 133 10/31/2014 0816   IRONPCTSAT 19 (L) 02/18/2013 0827   Lipid Panel     Component Value Date/Time   CHOL 160 09/03/2017 1349   TRIG 111 09/03/2017 1349   HDL 59 09/03/2017 1349   CHOLHDL 2.9 02/18/2013 0827   VLDL 23 02/18/2013 0827   LDLCALC 79 09/03/2017 1349   Hepatic Function Panel     Component Value Date/Time   PROT 6.5 04/28/2018 0915   PROT 6.9 03/11/2017 0841   PROT 6.7 01/07/2017 0758   ALBUMIN 3.8 04/28/2018 0915   ALBUMIN 4.5 03/11/2017 0841   ALBUMIN 3.6 01/07/2017 0758   AST 43 (H) 04/28/2018 0915   AST 51 (H) 01/07/2017 0758   ALT 61 (H) 04/28/2018 0915   ALT 82 (H) 01/07/2017 0758   ALKPHOS 122 04/28/2018 0915   ALKPHOS 115 01/07/2017 0758   BILITOT 0.6 04/28/2018 0915   BILITOT 0.72 01/07/2017 0758      Component Value Date/Time   TSH 1.54 02/07/2017   TSH 2.170 08/09/2016 1229   Results for Quiocho, Makensie L "[name]" (MRN [id number]) as of 05/04/2018 11:01  Ref. Range 09/03/2017 13:49  Vitamin D, 25-Hydroxy Latest Ref Range: 30.0 - 100.0 ng/mL 58.3     I, Marcille Blanco, CMA, am acting as transcriptionist for Starlyn Skeans, MD I have reviewed the above documentation for accuracy and completeness, and I agree with the above. -Dennard Nip, MD

## 2018-05-05 ENCOUNTER — Telehealth: Payer: Self-pay | Admitting: *Deleted

## 2018-05-05 ENCOUNTER — Other Ambulatory Visit: Payer: Self-pay

## 2018-05-05 ENCOUNTER — Inpatient Hospital Stay: Payer: Medicare Other

## 2018-05-05 VITALS — BP 128/63 | HR 65 | Temp 98.4°F | Resp 16

## 2018-05-05 DIAGNOSIS — C787 Secondary malignant neoplasm of liver and intrahepatic bile duct: Secondary | ICD-10-CM

## 2018-05-05 DIAGNOSIS — C50411 Malignant neoplasm of upper-outer quadrant of right female breast: Secondary | ICD-10-CM

## 2018-05-05 DIAGNOSIS — Z95828 Presence of other vascular implants and grafts: Secondary | ICD-10-CM

## 2018-05-05 DIAGNOSIS — Z171 Estrogen receptor negative status [ER-]: Secondary | ICD-10-CM

## 2018-05-05 DIAGNOSIS — C50911 Malignant neoplasm of unspecified site of right female breast: Secondary | ICD-10-CM

## 2018-05-05 DIAGNOSIS — Z5112 Encounter for antineoplastic immunotherapy: Secondary | ICD-10-CM | POA: Diagnosis not present

## 2018-05-05 DIAGNOSIS — C801 Malignant (primary) neoplasm, unspecified: Secondary | ICD-10-CM

## 2018-05-05 LAB — CBC WITH DIFFERENTIAL/PLATELET
Abs Immature Granulocytes: 0.02 10*3/uL (ref 0.00–0.07)
Basophils Absolute: 0 10*3/uL (ref 0.0–0.1)
Basophils Relative: 1 %
Eosinophils Absolute: 0.1 10*3/uL (ref 0.0–0.5)
Eosinophils Relative: 3 %
HCT: 30.2 % — ABNORMAL LOW (ref 36.0–46.0)
Hemoglobin: 9.8 g/dL — ABNORMAL LOW (ref 12.0–15.0)
Immature Granulocytes: 1 %
Lymphocytes Relative: 29 %
Lymphs Abs: 0.9 10*3/uL (ref 0.7–4.0)
MCH: 31.7 pg (ref 26.0–34.0)
MCHC: 32.5 g/dL (ref 30.0–36.0)
MCV: 97.7 fL (ref 80.0–100.0)
Monocytes Absolute: 0.2 10*3/uL (ref 0.1–1.0)
Monocytes Relative: 7 %
Neutro Abs: 1.8 10*3/uL (ref 1.7–7.7)
Neutrophils Relative %: 59 %
Platelets: 150 10*3/uL (ref 150–400)
RBC: 3.09 MIL/uL — ABNORMAL LOW (ref 3.87–5.11)
RDW: 14.3 % (ref 11.5–15.5)
WBC: 3 10*3/uL — ABNORMAL LOW (ref 4.0–10.5)
nRBC: 0 % (ref 0.0–0.2)

## 2018-05-05 LAB — COMPREHENSIVE METABOLIC PANEL
ALT: 93 U/L — ABNORMAL HIGH (ref 0–44)
AST: 60 U/L — ABNORMAL HIGH (ref 15–41)
Albumin: 3.7 g/dL (ref 3.5–5.0)
Alkaline Phosphatase: 125 U/L (ref 38–126)
Anion gap: 9 (ref 5–15)
BUN: 26 mg/dL — ABNORMAL HIGH (ref 8–23)
CO2: 20 mmol/L — ABNORMAL LOW (ref 22–32)
Calcium: 8.8 mg/dL — ABNORMAL LOW (ref 8.9–10.3)
Chloride: 113 mmol/L — ABNORMAL HIGH (ref 98–111)
Creatinine, Ser: 1.59 mg/dL — ABNORMAL HIGH (ref 0.44–1.00)
GFR calc Af Amer: 39 mL/min — ABNORMAL LOW (ref >60)
GFR calc non Af Amer: 34 mL/min — ABNORMAL LOW (ref >60)
Glucose, Bld: 157 mg/dL — ABNORMAL HIGH (ref 70–99)
Potassium: 4.2 mmol/L (ref 3.5–5.1)
Sodium: 142 mmol/L (ref 135–145)
Total Bilirubin: 0.5 mg/dL (ref 0.3–1.2)
Total Protein: 6.6 g/dL (ref 6.5–8.1)

## 2018-05-05 MED ORDER — SODIUM CHLORIDE 0.9% FLUSH
10.0000 mL | INTRAVENOUS | Status: DC | PRN
  Administered 2018-05-05: 10 mL
  Filled 2018-05-05: qty 10

## 2018-05-05 MED ORDER — HEPARIN SOD (PORK) LOCK FLUSH 100 UNIT/ML IV SOLN
500.0000 [IU] | Freq: Once | INTRAVENOUS | Status: AC | PRN
  Administered 2018-05-05: 500 [IU]
  Filled 2018-05-05: qty 5

## 2018-05-05 MED ORDER — ACETAMINOPHEN 325 MG PO TABS: 650.0000 mg | ORAL_TABLET | Freq: Once | ORAL | Status: DC

## 2018-05-05 MED ORDER — SODIUM CHLORIDE 0.9 % IV SOLN
Freq: Once | INTRAVENOUS | Status: AC
  Administered 2018-05-05: 09:00:00 via INTRAVENOUS
  Filled 2018-05-05: qty 250

## 2018-05-05 MED ORDER — PROCHLORPERAZINE MALEATE 10 MG PO TABS: 10.0000 mg | ORAL_TABLET | Freq: Once | ORAL | Status: DC

## 2018-05-05 MED ORDER — TRASTUZUMAB-ANNS CHEMO 420 MG IV SOLR
6.0000 mg/kg | Freq: Once | INTRAVENOUS | Status: AC
  Administered 2018-05-05: 546 mg via INTRAVENOUS
  Filled 2018-05-05: qty 26

## 2018-05-05 MED ORDER — SODIUM CHLORIDE 0.9% FLUSH
10.0000 mL | INTRAVENOUS | Status: DC | PRN
  Administered 2018-05-05: 10 mL via INTRAVENOUS
  Filled 2018-05-05: qty 10

## 2018-05-05 MED ORDER — DIPHENHYDRAMINE HCL 25 MG PO CAPS: 25.0000 mg | ORAL_CAPSULE | Freq: Once | ORAL | Status: DC

## 2018-05-05 MED ORDER — PACLITAXEL PROTEIN-BOUND CHEMO INJECTION 100 MG
80.0000 mg/m2 | Freq: Once | INTRAVENOUS | Status: AC
  Administered 2018-05-05: 175 mg via INTRAVENOUS
  Filled 2018-05-05: qty 35

## 2018-05-05 NOTE — Patient Instructions (Signed)
[facility] Discharge Instructions for Patients Receiving Chemotherapy  Today you received the following chemotherapy agents :  Abraxane, kanjinti   To help prevent nausea and vomiting after your treatment, we encourage you to take your nausea medication as prescribed.   If you develop nausea and vomiting that is not controlled by your nausea medication, call the clinic.   BELOW ARE SYMPTOMS THAT SHOULD BE REPORTED IMMEDIATELY:  *FEVER GREATER THAN 100.5 F  *CHILLS WITH OR WITHOUT FEVER  NAUSEA AND VOMITING THAT IS NOT CONTROLLED WITH YOUR NAUSEA MEDICATION  *UNUSUAL SHORTNESS OF BREATH  *UNUSUAL BRUISING OR BLEEDING  TENDERNESS IN MOUTH AND THROAT WITH OR WITHOUT PRESENCE OF ULCERS  *URINARY PROBLEMS  *BOWEL PROBLEMS  UNUSUAL RASH Items with * indicate a potential emergency and should be followed up as soon as possible.  Feel free to call the clinic should you have any questions or concerns. The clinic phone number is [phone].  Please show the [facility] at check-in to the Emergency Department and triage nurse.

## 2018-05-05 NOTE — Telephone Encounter (Signed)
Per MD ok to proceed with treatment today with noted creatinine 1.59 and AST 93.

## 2018-05-12 ENCOUNTER — Encounter (HOSPITAL_COMMUNITY)
Admission: RE | Admit: 2018-05-12 | Discharge: 2018-05-12 | Disposition: A | Discharge status: Home / Self Care | Payer: Medicare Other | Source: Ambulatory Visit | Attending: Oncology | Admitting: Oncology

## 2018-05-12 ENCOUNTER — Other Ambulatory Visit: Payer: Self-pay

## 2018-05-12 DIAGNOSIS — C50911 Malignant neoplasm of unspecified site of right female breast: Secondary | ICD-10-CM

## 2018-05-12 DIAGNOSIS — E119 Type 2 diabetes mellitus without complications: Secondary | ICD-10-CM | POA: Insufficient documentation

## 2018-05-12 DIAGNOSIS — C50411 Malignant neoplasm of upper-outer quadrant of right female breast: Secondary | ICD-10-CM | POA: Diagnosis present

## 2018-05-12 DIAGNOSIS — C801 Malignant (primary) neoplasm, unspecified: Secondary | ICD-10-CM | POA: Diagnosis present

## 2018-05-12 DIAGNOSIS — C787 Secondary malignant neoplasm of liver and intrahepatic bile duct: Secondary | ICD-10-CM | POA: Diagnosis present

## 2018-05-12 DIAGNOSIS — Z171 Estrogen receptor negative status [ER-]: Secondary | ICD-10-CM | POA: Diagnosis present

## 2018-05-12 LAB — GLUCOSE, CAPILLARY: Glucose-Capillary: 91 mg/dL (ref 70–99)

## 2018-05-12 MED ORDER — FLUDEOXYGLUCOSE F - 18 (FDG) INJECTION
9.8400 | Freq: Once | INTRAVENOUS | Status: AC | PRN
  Administered 2018-05-12: 9.84 via INTRAVENOUS

## 2018-05-14 ENCOUNTER — Encounter (HOSPITAL_COMMUNITY): Payer: Medicare Other

## 2018-05-18 NOTE — Progress Notes (Signed)
Darlene [name]  Telephone:([phone] Fax:([phone]) [phone]    ID: TOVAH SLAVICK DOB: [birth year]  MR#: [id number]  LZJ#:[id number]  Patient Care Team: Vernie Shanks, MD as PCP - General (Family Medicine) Himmelrich, Bryson Ha, RD (Inactive) as Dietitian (Bariatrics) Stark Klein, MD as Consulting Physician (General Surgery) Female Iafrate, Virgie Dad, MD as Consulting Physician (Oncology) Arloa Koh, MD as Consulting Physician (Radiation Oncology) Mauro Kaufmann, RN as Registered Nurse Rockwell Germany, RN as Registered Nurse Jacelyn Pi, MD as Consulting Physician (Endocrinology) [name], MD as Consulting Physician (Plastic Surgery) Larey Dresser, MD as Consulting Physician (Cardiology) Gery Pray, MD as Consulting Physician (Radiation Oncology) Juanita Craver, MD as Consulting Physician (Gastroenterology)  OTHER MD:   CHIEF COMPLAINT:  HER-2 positive, estrogen receptor negative locally recurrent disease  CURRENT TREATMENT:  Trastuzumab; Abraxane   INTERVAL HISTORY:  Darlene [name] is seen today for follow-up and treatment of her estrogen receptor negative but HER-2 amplified breast cancer.   She continues on trastuzumab, with her most recent dose received on 05/05/2018. She tolerates this well and without any noticeable side effects.    She also continues on abraxane. Today is day 1 cycle 8, dose #14 (some doses missed in course so far). She tolerates this well and without any noticeable side effects.     Chandani's last echocardiogram on 03/06/2018, showed an ejection fraction in the 55% - 60% range.   Since her last visit here, she underwent a restaging PET scan on 05/12/2018 showing: Response to therapy of isolated hepatic metastasis. No evidence of residual or new hypermetabolic liver lesion. Similar porta hepatis mildly hypermetabolic adenopathy. Favored to be reactive. Caudate lobe prominence which could represent "pseudo-cirrhosis" of treated  metastasis or mild cirrhosis. Correlate with risk factors for concurrent mild cirrhosis. Incidental findings, including cholelithiasis, left nephrolithiasis. Coronary artery atherosclerosis. Aortic Atherosclerosis (ICD10-I70.0).   REVIEW OF SYSTEMS: Darlene [name]'s neuropathy remains unchanged, primarily in her feet. She notes that she hasn't had her A1C checked in a while, and that she has gained some weight. For exercise, she hasn't had a formal exercise routine recently, but she is in the process of moving and is working to downsize.   The patient denies unusual headaches, visual changes, nausea, vomiting, or dizziness. There has been no unusual cough, phlegm production, or pleurisy. This been no change in bowel or bladder habits. The patient denies unexplained fatigue or unexplained weight loss, bleeding, rash, or fever. A detailed review of systems was otherwise noncontributory.   Wt Readings from Last 3 Encounters:  05/19/18 201 lb 3.2 oz (91.3 kg)  04/28/18 197 lb 9.6 oz (89.6 kg)  04/07/18 197 lb 9.6 oz (89.6 kg)    BREAST CANCER HISTORY: From the original intake note:  "Darlene [name]" had screening mammography at her gynecologist suggestive of a possible abnormality in the right breast. However right diagnostic mammogram 04/12/2013 at [facility] was negative. More recently however, she noted a lump in her right breast and brought it to her gynecologist's attention. On 07/18/2014 the patient had bilateral diagnostic mammography with tomosynthesis at [facility]. The breast density was category B. In the right breast upper outer quadrant there was an area of focal asymmetry with indistinct margins.[name] Ultrasound was obtained and showed in addition to the mass in question, measuring 1.7 cm, an abnormal-appearing lymph node in the right axillary tail.  On 07/19/2014 the patient underwent biopsy of the right breast massin question as well as a right axillary lymph node. Both were positive for invasive ductal carcinoma,  grade 2, estrogen and progesterone receptor negative, with an MIB-1 of 90%, and HER-2 pending. Incidentally the lymph node biopsy showed a lymphocytic inflammatory component but no lymph node architecture.  Her subsequent history is as detailed above.   PAST MEDICAL HISTORY: Past Medical History:  Diagnosis Date   Anemia    hx of - during 1st round chemo   Anxiety    Arthritis    in fingers, shoulders   Back pain    Balance problem    Breast cancer ([place])    Breast cancer of upper-outer quadrant of right female breast ([place]) 07/22/2014   Bunion, right foot    Clumsiness    Constipation    Depression    Diabetes mellitus without complication ([place])    50+ years   Eating disorder    binge eating   Epistaxis 08/26/2014   Fatty liver    HCAP (healthcare-associated pneumonia) 12/18/2014   Headache    Heart murmur    never had any problems   History of radiation therapy 04/05/15-05/19/15   right chest wall was treated to 50.4 Gy in 28 fractions, right mastectomy scar was treated to 10 Gy in 5 fractions   Hyperlipidemia    Hypertension    Joint pain    Multiple food allergies    Neuromuscular disorder ([place])    diabetic neuropathy   Obesity    Obstructive sleep apnea on CPAP    uses cpap nightly   Ovarian cyst rupture    possible   Pneumonia    Pneumonia 11/2014   Radiation 04/05/15-05/19/15   right chest wall 50.4 Gy, mastectomy scar 10 Gy   Sleep apnea    Thyroid nodule 06/2014    PAST SURGICAL HISTORY: Past Surgical History:  Procedure Laterality Date   APPENDECTOMY     BIOPSY THYROID Bilateral 06/2014   2 nodules - Benign    BREAST LUMPECTOMY WITH RADIOACTIVE SEED AND SENTINEL LYMPH NODE BIOPSY Right 12/27/2014   Procedure: BREAST LUMPECTOMY WITH RADIOACTIVE SEED AND SENTINEL LYMPH NODE BIOPSY;  Surgeon: Stark Klein, MD;  Location: [facility];  Service: General;  Laterality: Right;   BREAST REDUCTION SURGERY Bilateral  01/06/2015   Procedure: BILATERAL BREAST REDUCTION, RIGHT ONCOPLASTIC RECONSTRUCTION),LEFT BREAST REDUCTION FOR SYMMETRY;  Surgeon: [name], MD;  Location: [facility];  Service: Plastics;  Laterality: Bilateral;   BREATH TEK H PYLORI N/A 09/15/2012   Procedure: BREATH TEK H PYLORI;  Surgeon: Pedro Earls, MD;  Location: [facility];  Service: General;  Laterality: N/A;   BUNIONECTOMY Left 12/2013   CARPAL TUNNEL RELEASE Right    CARPAL TUNNEL RELEASE Left    CATARACT EXTRACTION     6/19   COLONOSCOPY     DUPUYTREN CONTRACTURE RELEASE     GASTRIC ROUX-EN-Y N/A 11/02/2012   Procedure: LAPAROSCOPIC ROUX-EN-Y GASTRIC;  Surgeon: Pedro Earls, MD;  Location: [facility];  Service: General;  Laterality: N/A;   IR GENERIC HISTORICAL  03/18/2016   IR US GUIDE VASC ACCESS LEFT 03/18/2016 Markus Daft, MD [facility]   IR GENERIC HISTORICAL  03/18/2016   IR FLUORO GUIDE CV LINE LEFT 03/18/2016 Markus Daft, MD [facility]   LAPAROSCOPIC APPENDECTOMY N/A 07/22/2015   Procedure: APPENDECTOMY LAPAROSCOPIC;  Surgeon: Michael Boston, MD;  Location: [facility];  Service: General;  Laterality: N/A;   MASTECTOMY Right 02/15/2015   modified   MODIFIED MASTECTOMY Right 02/15/2015   Procedure: RIGHT MODIFIED RADICAL MASTECTOMY;  Surgeon: Stark Klein, MD;  Location: [place];  Service: General;  Laterality: Right;   PORT-A-CATH REMOVAL Left 10/10/2015   Procedure: PORT REMOVAL;  Surgeon: Stark Klein, MD;  Location: [facility];  Service: General;  Laterality: Left;   PORTACATH PLACEMENT Left 08/02/2014   Procedure: INSERTION PORT-A-CATH;  Surgeon: Stark Klein, MD;  Location: [facility];  Service: General;  Laterality: Left;   PORTACATH PLACEMENT N/A 11/05/2016   Procedure: INSERTION PORT-A-CATH;  Surgeon: Stark Klein, MD;  Location: [place];  Service: General;  Laterality: N/A;   ROTATOR CUFF REPAIR Right 2005   SCAR REVISION Right 12/08/2015   Procedure: COMPLEX REPAIR RIGHT CHEST 10-15CM;   Surgeon: [name], MD;  Location: [place];  Service: Plastics;  Laterality: Right;   TOE SURGERY  1970s   bone spur   TRIGGER FINGER RELEASE     x3    FAMILY HISTORY Family History  Problem Relation Age of Onset   CVA Mother    Heart disease Mother    Sudden death Mother    Diabetes Father    Heart failure Father    Hypertension Father    Kidney disease Father    Obesity Father    Hypertension Sister    Diabetes Brother    Breast cancer Paternal Grandmother 66   Diabetes Paternal Uncle    Ovarian cancer Neg Hx    The patient's father died from complications of diabetes at age 72. The patient's mother died at age 40 with a subarachnoid hemorrhage. The patient had one brother, one sister. The only breast cancer was the patient's paternal grandmother diagnosed in her 88s. There is no history of ovarian cancer in the family.   GYNECOLOGIC HISTORY:  Patient's last menstrual period was 10/22/2007.  menarche age 89, the patient is GX P0. She went through the change of life in 2011. She did not take hormone replacement.   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 Her husband [name] is a retired Visual merchandiser (he taught at [employer]).  [name] has 2 children from an earlier marriage, Rockey Situ who teaches in [place], and Deasia Chiu,  who works at the [employer] in in [place]. He has 1 grand son, aged 63. The patient and her husband are not church attenders.   ADVANCED DIRECTIVES:  Not in place   HEALTH MAINTENANCE: Social History   Tobacco Use   Smoking status: Never Smoker   Smokeless tobacco: Never Used  Substance Use Topics   Alcohol use: Yes    Alcohol/week: 1.0 standard drinks    Types: 1 Standard drinks or equivalent per week    Comment: social   Drug use: No     Colonoscopy:   May 2016  PAP:  Bone density: 07/01/2016 at [facility] showed normal, with a T score of -0.7  Lipid panel:  Allergies  Allergen Reactions   Nsaids Other (See Comments)    H/o gastric bypass - avoid NSAIDs   Tape Hives, Rash and Other (See Comments)    Adhesives in bandaids    Current Outpatient Medications  Medication Sig Dispense Refill   acetaminophen (TYLENOL) 325 MG tablet Take 650 mg by mouth 2 (two) times daily as needed for moderate pain or headache.      buPROPion (WELLBUTRIN SR) 200 MG 12 hr tablet TAKE 1 TABLET BY MOUTH DAILY AT 12 NOON. 30 tablet 0   CALCIUM  CITRATE-VITAMIN D PO Take 1 tablet by mouth 3 (three) times daily.      cholecalciferol (VITAMIN D) 1000 units tablet Take 1,000 Units by mouth daily.     Cyanocobalamin (VITAMIN B-12 PO) Take 1 tablet by mouth every 14 (fourteen) days.     diphenhydrAMINE (BENADRYL) 25 MG tablet Take 25 mg by mouth daily as needed for allergies or sleep.      Insulin Glargine (LANTUS SOLOSTAR) 100 UNIT/ML Solostar Pen Inject 12 Units into the skin daily. 5 pen 0   Insulin Pen Needle (BD PEN NEEDLE NANO U/F) 32G X 4 MM MISC 1 Package by Does not apply route daily at 12 noon. 100 each 0   liraglutide (VICTOZA) 18 MG/3ML SOPN INJECT 1.'8MG'$ (0.3 MLS) INTO THE SKIN EVERY MORNING 3 pen 0   loratadine (CLARITIN) 10 MG tablet Take 10 mg by mouth as needed for allergies.     losartan (COZAAR) 100 MG tablet Take 1 tablet (100 mg total) by mouth daily. 30 tablet 0   Melatonin 3 MG CAPS Take 3 mg by mouth at bedtime.      metFORMIN (GLUCOPHAGE) 500 MG tablet Take 1 tablet (500 mg total) by mouth daily. 30 tablet 0   ONETOUCH DELICA LANCETS 35K MISC 1 Package by Does not apply route daily. 100 each 0   Pediatric Multivitamins-Iron (FLINTSTONES PLUS IRON) chewable tablet Chew 1 tablet by mouth 2 (two) times daily.     Probiotic Product ([employer] PROBIOTIC PO) Take 1 capsule by mouth daily.      prochlorperazine (COMPAZINE) 10 MG tablet Take 1  tablet (10 mg total) by mouth every 6 (six) hours as needed for nausea or vomiting. 30 tablet 0   Propylene Glycol (SYSTANE BALANCE OP) Place 1-2 drops into both eyes 3 (three) times daily as needed (for dry eyes).      senna-docusate (SENNA PLUS) 8.6-50 MG tablet Take 1 tablet by mouth daily as needed for moderate constipation.      topiramate (TOPAMAX) 25 MG tablet Take 1 tablet (25 mg total) by mouth daily. 30 tablet 0   vortioxetine HBr (TRINTELLIX) 20 MG TABS tablet Take 1 tablet (20 mg total) by mouth at bedtime. 30 tablet 0   No current facility-administered medications for this visit.     OBJECTIVE: Latest white woman who appears stated age  79:   05/19/18 0928  BP: (!) 142/59  Pulse: 63  Resp: 17  Temp: 97.7 F (36.5 C)  SpO2: 99%     Body mass index is 29.71 kg/m.    ECOG FS:1 - Symptomatic but completely ambulatory Filed Weights   05/19/18 0928  Weight: 201 lb 3.2 oz (91.3 kg)   Sclerae unicteric, pupils round and equal Wearing a mask No cervical or supraclavicular adenopathy Lungs no rales or rhonchi Heart regular rate and rhythm Abd soft, nontender, positive bowel sounds MSK no focal spinal tenderness, no upper extremity lymphedema Neuro: nonfocal, well oriented, appropriate affect Breasts: Status post right mastectomy.  No evidence of local recurrence.  Left breast is benign.  Both axillae are benign.  LAB RESULTS:  CMP     Component Value Date/Time   NA 142 05/05/2018 0814   NA 143 03/11/2017 0841   NA 140 01/07/2017 0758   K 4.2 05/05/2018 0814   K 4.1 01/07/2017 0758   CL 113 (H) 05/05/2018 0814   CO2 20 (L) 05/05/2018 0814   CO2 24 01/07/2017 0758   GLUCOSE 157 (H) 05/05/2018 0814   GLUCOSE  134 01/07/2017 0758   BUN 26 (H) 05/05/2018 0814   BUN 40 (H) 03/11/2017 0841   BUN 33.6 (H) 01/07/2017 0758   CREATININE 1.59 (H) 05/05/2018 0814   CREATININE 1.62 (H) 04/28/2018 0915   CREATININE 1.5 (H) 01/07/2017 0758   CALCIUM 8.8 (L)  05/05/2018 0814   CALCIUM 9.3 01/07/2017 0758   PROT 6.6 05/05/2018 0814   PROT 6.9 03/11/2017 0841   PROT 6.7 01/07/2017 0758   ALBUMIN 3.7 05/05/2018 0814   ALBUMIN 4.5 03/11/2017 0841   ALBUMIN 3.6 01/07/2017 0758   AST 60 (H) 05/05/2018 0814   AST 43 (H) 04/28/2018 0915   AST 51 (H) 01/07/2017 0758   ALT 93 (H) 05/05/2018 0814   ALT 61 (H) 04/28/2018 0915   ALT 82 (H) 01/07/2017 0758   ALKPHOS 125 05/05/2018 0814   ALKPHOS 115 01/07/2017 0758   BILITOT 0.5 05/05/2018 0814   BILITOT 0.6 04/28/2018 0915   BILITOT 0.72 01/07/2017 0758   GFRNONAA 34 (L) 05/05/2018 0814   GFRNONAA 33 (L) 04/28/2018 0915   GFRNONAA 55 (L) 02/18/2013 0827   GFRAA 39 (L) 05/05/2018 0814   GFRAA 38 (L) 04/28/2018 0915   GFRAA 63 02/18/2013 0827    INo results found for: SPEP, UPEP  Lab Results  Component Value Date   WBC 3.1 (L) 05/19/2018   NEUTROABS 1.9 05/19/2018   HGB 9.6 (L) 05/19/2018   HCT 29.4 (L) 05/19/2018   MCV 97.0 05/19/2018   PLT 128 (L) 05/19/2018      Chemistry      Component Value Date/Time   NA 142 05/05/2018 0814   NA 143 03/11/2017 0841   NA 140 01/07/2017 0758   K 4.2 05/05/2018 0814   K 4.1 01/07/2017 0758   CL 113 (H) 05/05/2018 0814   CO2 20 (L) 05/05/2018 0814   CO2 24 01/07/2017 0758   BUN 26 (H) 05/05/2018 0814   BUN 40 (H) 03/11/2017 0841   BUN 33.6 (H) 01/07/2017 0758   CREATININE 1.59 (H) 05/05/2018 0814   CREATININE 1.62 (H) 04/28/2018 0915   CREATININE 1.5 (H) 01/07/2017 0758      Component Value Date/Time   CALCIUM 8.8 (L) 05/05/2018 0814   CALCIUM 9.3 01/07/2017 0758   ALKPHOS 125 05/05/2018 0814   ALKPHOS 115 01/07/2017 0758   AST 60 (H) 05/05/2018 0814   AST 43 (H) 04/28/2018 0915   AST 51 (H) 01/07/2017 0758   ALT 93 (H) 05/05/2018 0814   ALT 61 (H) 04/28/2018 0915   ALT 82 (H) 01/07/2017 0758   BILITOT 0.5 05/05/2018 0814   BILITOT 0.6 04/28/2018 0915   BILITOT 0.72 01/07/2017 0758       No results found for: LABCA2  No  components found for: [id number]  No results for input(s): INR in the last 168 hours.  Urinalysis    Component Value Date/Time   COLORURINE YELLOW 07/21/2015 [place] 07/21/2015 2328   LABSPEC 1.020 07/21/2015 2328   PHURINE 6.0 07/21/2015 2328   GLUCOSEU NEGATIVE 07/21/2015 2328   HGBUR TRACE (A) 07/21/2015 [place] 07/21/2015 2328   BILIRUBINUR neg 06/29/2013 [place] 07/21/2015 2328   PROTEINUR 30 (A) 07/21/2015 2328   UROBILINOGEN negative 06/29/2013 1017   NITRITE NEGATIVE 07/21/2015 2328   LEUKOCYTESUR NEGATIVE 07/21/2015 2328    STUDIES: Nm Pet Image Restag (ps) Skull Base To Thigh  Result Date: 05/12/2018 CLINICAL DATA:  Subsequent treatment strategy for restaging of  breast cancer. Liver metastasis. Right breast primary. Type 2 diabetes. EXAM: NUCLEAR MEDICINE PET SKULL BASE TO THIGH TECHNIQUE: 9.8 mCi F-18 FDG was injected intravenously. Full-ring PET imaging was performed from the skull base to thigh after the radiotracer. CT data was obtained and used for attenuation correction and anatomic localization. Fasting blood glucose: 91 mg/dl COMPARISON:  PET of 12/09/2017. Abdominal MRI of 02/24/2018 FINDINGS: Mediastinal blood pool activity: SUV max 3.0 NECK: No areas of abnormal hypermetabolism. Incidental CT findings: Bilateral carotid atherosclerosis. No cervical adenopathy. CHEST: No pulmonary parenchymal or thoracic nodal hypermetabolism. Incidental CT findings: Left Port-A-Cath tip at high right atrium. Tiny hiatal hernia. Right mastectomy and axillary node dissection. No axillary adenopathy. Aortic and coronary artery calcification ABDOMEN/PELVIS: The previously described hypermetabolic hepatic lesion is only subtly apparent at 10 mm on CT image 86/4. No longer hypermetabolic to the remainder of the liver. Compare 2.5 cm and a S.U.V. max of 20.3 on the prior exam. No new areas of hepatic hypermetabolism. Mild porta hepatis  adenopathy is similar. Example node measures 1.4 cm and a S.U.V. max of 3.0 on image 104/4. Compare similar in size and a S.U.V. max of 2.9 on the prior exam. Incidental CT findings: Caudate lobe prominence. Small gallstones. Bilateral adrenal thickening and mild nodularity, similar. Lower pole left renal collecting system calculi of up to 6 mm. No hydronephrosis. Status post gastric bypass. Advanced abdominal aortic and branch vessel atherosclerosis. Extensive colonic diverticulosis. Right pelvic calcification is favored to represent a subserosal fibroid at 2.1 cm and is not significantly changed. Mild pelvic floor laxity. SKELETON: Degenerative hypermetabolism about the left sternoclavicular joint. Incidental CT findings: No suspicious focal osseous lesion. IMPRESSION: 1. Response to therapy of isolated hepatic metastasis. No evidence of residual or new hypermetabolic liver lesion. 2. Similar porta hepatis mildly hypermetabolic adenopathy. Favored to be reactive. 3. Caudate lobe prominence which could represent "pseudo-cirrhosis" of treated metastasis or mild cirrhosis. Correlate with risk factors for concurrent mild cirrhosis. 4. Incidental findings, including cholelithiasis, left nephrolithiasis. 5. Coronary artery atherosclerosis. Aortic Atherosclerosis (ICD10-I70.0). Electronically Signed   By: Abigail Miyamoto M.D.   On: 05/12/2018 14:55     ASSESSMENT: 68 y.o. [place] woman status post right breast upper outer quadrant and right axillary lymph node biopsy 07/19/2014 for a cT2  pN1, stage IIB  invasive ductal carcinoma, grade 2, estrogen and progesterone receptor negative, with an MIB-1 of 90%, and HER-2 amplified  (1) neoadjuvant chemotherapy started 08/08/2014, consisting of carboplatin, docetaxel, trastuzumab and pertuzumab given every 3 weeks 6, completed 11/21/2014  (a) breast MRI after initial 3 cycles shows a significant response  (b) breast MRI after 6 cycles shows a complete radiologic  response   (2) trastuzumab continued through 10/02/2015 (to end of capecitabine therapy)  (a) echo 02/13/2015 shows an EF of 60-65%  (b)  echo 05/09/2015 shows an ejection fraction of 60-65%  (c) echocardiogram 06/27/2015 shows an ejection fraction of 55-60%.  (d) echocardiogram 01/22/2017 shows an ejection fraction of 55-60%  (3) right lumpectomy with sentinel lymph node biopsy on 12/27/14 showed a residual  ypT2 ypN1a, stage IIB invasive ductal carcinoma, grade 2, with repeat estrogen and progesterone receptors again negative  (4) status post right oncoplastic surgery (with left reduction mammoplasty) 01/06/2015 showing in the right breast multiple foci of intralymphatic carcinoma in random sections  (5)  Right modified radical mastectomy 02/15/2014 showed no residual invasive disease; there was DCIS but margins were negative; all 15 axillary lymph nodes were clear; ypTis ypN0  (a)  The patient has decided against reconstruction.  (6)  Postmastectomy radiation completed 05/18/2015 with capecitabine sensitization  (7) adjuvant capecitabine continued through 09/27/2015  (8) hepatic steatosis with increased fibrosis score (at risk for cirrhosis)  (9) thyroid biopsy (left lobe inferiorly) 2 shows benign tissue 06/28/2015  (10) LOCAL RECURRENCE:  (a) PET scan 12/06/2015 Notes a right retropectoral lymph node measuring 0.9 cm with an SUV max of 5.5  (b) repeat PET scan 03/11/2016 finds the right subpectoral lymph node to now measure 1.3 cm with an SUV max of 11.8.  (c) PET scan 05/07/2016 shows a significant response to T-DM 1  (d) PET scan 07/10/2017 is clear except for what appears to be a single new liver lesion   (11) started T-DM1 03/21/2016, completed 8 doses 08/13/2016  (a) PET scan 08/19/2016 shows a good response but measurable residual disease  (b) PET scan on 01/16/2017 shows a continuing response  (12) maintenance trastuzumab started 09/03/2016--changed to every 28 days as of  01/28/2017  (a) repeat echocardiogram 07/08/2016 shows an ejection fraction of 60-65%.  (b) echocardiogram on 10/08/2016 demonstrates LVEF of 55-60% (followed by Dr. Haroldine Laws)  (c) echo 01/22/2017 shows an ejection fraction in the 55-60% range  (d) echo 04/24/2017 shows an ejection fraction in the 55-60% range (recommendation for echo every 6 months by Dr. Aundra Dubin)  (e) trastuzumab changed to T-DM 1 after trastuzumab dose 07/15/2017  METASTATIC DISEASE: July 2019 (13)  Radiation to the area of persistent disease in the right upper chest completed 10/30/2016   (14) T-DM 1 resumed 08/12/2017, repeated every 21 days  (a) baseline liver MRI 08/02/2017 shows a 1.4 cm mass, "hot" on PET 07/10/2017  (b) repeat liver MRI 11/18/2017 after 3 cycles of T-DM 1, shows slight enlargement  (c) T-DM 1 discontinued after 09/23/2017 dose  (15) trastuzumab resumed 11/18/2017, to be repeated every 4 weeks,   (a) changed back to every 3 weeks in 03/2018  (16) liver biopsy 11/03/2017 shows adenocarcinoma, most consistent with an upper gastrointestinal primary; this tumor was estrogen receptor and HER-2 negative  (a) GI workup for occult primary Collene Mares) negative  (b) PET 12/09/2017 shows no obvious primary.  There is a right subpectoral lymph node noted as well  (c) CA 27-29 and CA 19-9 uninformative; CEA 6.72 OCT 2019   (17) started Abraxane on 12/111/2019, given days 1 and 8 on a 21 day cycle   (a) restaging with MRI liver 02/24/2018 shows a gratifying initial response  (b) PET scan 05/14/2018 shows resolution of the measurable disease in the liver  (18) consider Yttrium or other local treatment to liver per IR if liver recurrence develops   PLAN: Darlene [name] is having an excellent response to treatment.  At this point she has no measurable disease.  This is very favorable  She is tolerating the radial and well.  I am concerned of course about peripheral neuropathy which she has at baseline.  This does not appear to  have worsened.  We are going to do this cycle as usual, day 1 day 8, but after that we will change to the a regulant to every 2 weeks.  We will change the trastuzumab to every 2 weeks also at that time so she will only have to come here twice a month.  The plan will be to continue this through October and repeat a PET scan shortly before November.  She will need an echocardiogram sometime within the next 2 months once the pandemic allows  The issue with  her second toenail on the left foot I think it is trauma.  It is not a fungus  She will see me again in June.  She knows to call for any other issues that may develop before the next visit.  Virgie Dad. Tivon Lemoine, MD  05/19/18 9:48 AM Medical Oncology and Hematology [facility] [address] [place], [place] [address] Tel. [phone]    Fax. [phone]  I, Jacqualyn Posey am acting as a Education administrator for Chauncey Cruel, MD.   I, Lurline Del MD, have reviewed the above documentation for accuracy and completeness, and I agree with the above.

## 2018-05-19 ENCOUNTER — Inpatient Hospital Stay: Payer: Medicare Other

## 2018-05-19 ENCOUNTER — Telehealth: Payer: Self-pay | Admitting: *Deleted

## 2018-05-19 ENCOUNTER — Inpatient Hospital Stay: Payer: Medicare Other | Admitting: Oncology

## 2018-05-19 ENCOUNTER — Other Ambulatory Visit: Payer: Self-pay

## 2018-05-19 VITALS — BP 142/59 | HR 63 | Temp 97.7°F | Resp 17 | Ht 69.0 in | Wt 201.2 lb

## 2018-05-19 DIAGNOSIS — G629 Polyneuropathy, unspecified: Secondary | ICD-10-CM | POA: Diagnosis not present

## 2018-05-19 DIAGNOSIS — C50411 Malignant neoplasm of upper-outer quadrant of right female breast: Secondary | ICD-10-CM

## 2018-05-19 DIAGNOSIS — Z171 Estrogen receptor negative status [ER-]: Secondary | ICD-10-CM

## 2018-05-19 DIAGNOSIS — C787 Secondary malignant neoplasm of liver and intrahepatic bile duct: Secondary | ICD-10-CM

## 2018-05-19 DIAGNOSIS — Z5112 Encounter for antineoplastic immunotherapy: Secondary | ICD-10-CM | POA: Diagnosis not present

## 2018-05-19 DIAGNOSIS — C50911 Malignant neoplasm of unspecified site of right female breast: Secondary | ICD-10-CM

## 2018-05-19 DIAGNOSIS — C801 Malignant (primary) neoplasm, unspecified: Secondary | ICD-10-CM

## 2018-05-19 DIAGNOSIS — Z95828 Presence of other vascular implants and grafts: Secondary | ICD-10-CM

## 2018-05-19 LAB — CBC WITH DIFFERENTIAL/PLATELET
Abs Immature Granulocytes: 0.01 10*3/uL (ref 0.00–0.07)
Basophils Absolute: 0 10*3/uL (ref 0.0–0.1)
Basophils Relative: 0 %
Eosinophils Absolute: 0.1 10*3/uL (ref 0.0–0.5)
Eosinophils Relative: 2 %
HCT: 29.4 % — ABNORMAL LOW (ref 36.0–46.0)
Hemoglobin: 9.6 g/dL — ABNORMAL LOW (ref 12.0–15.0)
Immature Granulocytes: 0 %
Lymphocytes Relative: 26 %
Lymphs Abs: 0.8 10*3/uL (ref 0.7–4.0)
MCH: 31.7 pg (ref 26.0–34.0)
MCHC: 32.7 g/dL (ref 30.0–36.0)
MCV: 97 fL (ref 80.0–100.0)
Monocytes Absolute: 0.4 10*3/uL (ref 0.1–1.0)
Monocytes Relative: 13 %
Neutro Abs: 1.9 10*3/uL (ref 1.7–7.7)
Neutrophils Relative %: 59 %
Platelets: 128 10*3/uL — ABNORMAL LOW (ref 150–400)
RBC: 3.03 MIL/uL — ABNORMAL LOW (ref 3.87–5.11)
RDW: 14.8 % (ref 11.5–15.5)
WBC: 3.1 10*3/uL — ABNORMAL LOW (ref 4.0–10.5)
nRBC: 0 % (ref 0.0–0.2)

## 2018-05-19 LAB — COMPREHENSIVE METABOLIC PANEL
ALT: 78 U/L — ABNORMAL HIGH (ref 0–44)
AST: 44 U/L — ABNORMAL HIGH (ref 15–41)
Albumin: 3.7 g/dL (ref 3.5–5.0)
Alkaline Phosphatase: 124 U/L (ref 38–126)
Anion gap: 8 (ref 5–15)
BUN: 26 mg/dL — ABNORMAL HIGH (ref 8–23)
CO2: 23 mmol/L (ref 22–32)
Calcium: 8.8 mg/dL — ABNORMAL LOW (ref 8.9–10.3)
Chloride: 112 mmol/L — ABNORMAL HIGH (ref 98–111)
Creatinine, Ser: 1.54 mg/dL — ABNORMAL HIGH (ref 0.44–1.00)
GFR calc Af Amer: 41 mL/min — ABNORMAL LOW (ref >60)
GFR calc non Af Amer: 35 mL/min — ABNORMAL LOW (ref >60)
Glucose, Bld: 108 mg/dL — ABNORMAL HIGH (ref 70–99)
Potassium: 4.2 mmol/L (ref 3.5–5.1)
Sodium: 143 mmol/L (ref 135–145)
Total Bilirubin: 0.5 mg/dL (ref 0.3–1.2)
Total Protein: 6.3 g/dL — ABNORMAL LOW (ref 6.5–8.1)

## 2018-05-19 MED ORDER — SODIUM CHLORIDE 0.9% FLUSH
10.0000 mL | INTRAVENOUS | Status: DC | PRN
  Administered 2018-05-19: 10 mL via INTRAVENOUS
  Filled 2018-05-19: qty 10

## 2018-05-19 MED ORDER — HEPARIN SOD (PORK) LOCK FLUSH 100 UNIT/ML IV SOLN
500.0000 [IU] | Freq: Once | INTRAVENOUS | Status: AC | PRN
  Administered 2018-05-19: 500 [IU]
  Filled 2018-05-19: qty 5

## 2018-05-19 MED ORDER — PACLITAXEL PROTEIN-BOUND CHEMO INJECTION 100 MG
80.0000 mg/m2 | Freq: Once | INTRAVENOUS | Status: AC
  Administered 2018-05-19: 175 mg via INTRAVENOUS
  Filled 2018-05-19: qty 35

## 2018-05-19 MED ORDER — SODIUM CHLORIDE 0.9 % IV SOLN
Freq: Once | INTRAVENOUS | Status: AC
  Administered 2018-05-19: 10:00:00 via INTRAVENOUS
  Filled 2018-05-19: qty 250

## 2018-05-19 MED ORDER — SODIUM CHLORIDE 0.9% FLUSH
10.0000 mL | INTRAVENOUS | Status: DC | PRN
  Administered 2018-05-19: 10 mL
  Filled 2018-05-19: qty 10

## 2018-05-19 MED ORDER — PACLITAXEL PROTEIN-BOUND CHEMO INJECTION 100 MG: 80.0000 mg/m2 | Freq: Once | Status: DC

## 2018-05-19 MED ORDER — PROCHLORPERAZINE MALEATE 10 MG PO TABS: 10.0000 mg | ORAL_TABLET | Freq: Once | ORAL | Status: DC

## 2018-05-19 NOTE — Telephone Encounter (Signed)
Per MD review ok to treat with creatinine on 1.54.

## 2018-05-19 NOTE — Patient Instructions (Addendum)
[facility] Discharge Instructions for Patients Receiving Chemotherapy  Today you received the following chemotherapy agents:  Abraxane (paclitaxel - protein bound)  To help prevent nausea and vomiting after your treatment, we encourage you to take your nausea medication as prescribed.   If you develop nausea and vomiting that is not controlled by your nausea medication, call the clinic.   BELOW ARE SYMPTOMS THAT SHOULD BE REPORTED IMMEDIATELY:  *FEVER GREATER THAN 100.5 F  *CHILLS WITH OR WITHOUT FEVER  NAUSEA AND VOMITING THAT IS NOT CONTROLLED WITH YOUR NAUSEA MEDICATION  *UNUSUAL SHORTNESS OF BREATH  *UNUSUAL BRUISING OR BLEEDING  TENDERNESS IN MOUTH AND THROAT WITH OR WITHOUT PRESENCE OF ULCERS  *URINARY PROBLEMS  *BOWEL PROBLEMS  UNUSUAL RASH Items with * indicate a potential emergency and should be followed up as soon as possible.  Feel free to call the clinic should you have any questions or concerns. The clinic phone number is [phone].  Please show the [facility] at check-in to the Emergency Department and triage nurse.   Coronavirus (COVID-19) Are you at risk?  Are you at risk for the Coronavirus (COVID-19)?  To be considered HIGH RISK for Coronavirus (COVID-19), you have to meet the following criteria:  . Traveled to [place], [place], [place], [place] or [place]; or in the [place] to [place], [place], [place], or [place]; and have fever, cough, and shortness of breath within the last 2 weeks of travel OR . Been in close contact with a person diagnosed with COVID-19 within the last 2 weeks and have fever, cough, and shortness of breath . IF YOU DO NOT MEET THESE CRITERIA, YOU ARE CONSIDERED LOW RISK FOR COVID-19.  What to do if you are HIGH RISK for COVID-19?  [name] If you are having a medical emergency, call 911. . Seek medical care right away. Before you go to a doctor's office, urgent care or emergency department, call  ahead and tell them about your recent travel, contact with someone diagnosed with COVID-19, and your symptoms. You should receive instructions from your physician's office regarding next steps of care.  . When you arrive at healthcare provider, tell the healthcare staff immediately you have returned from visiting [place], [place], [place], [place] or [place]; or traveled in the [place] to [place], [place], [place], or [place]; in the last two weeks or you have been in close contact with a person diagnosed with COVID-19 in the last 2 weeks.   . Tell the health care staff about your symptoms: fever, cough and shortness of breath. . After you have been seen by a medical provider, you will be either: o Tested for (COVID-19) and discharged home on quarantine except to seek medical care if symptoms worsen, and asked to  - Stay home and avoid contact with others until you get your results (4-5 days)  - Avoid travel on public transportation if possible (such as bus, train, or airplane) or o Sent to the Emergency Department by EMS for evaluation, COVID-19 testing, and possible admission depending on your condition and test results.  What to do if you are LOW RISK for COVID-19?  Reduce your risk of any infection by using the same precautions used for avoiding the common cold or flu:  [name] Wash your hands often with soap and warm water for at least 20 seconds.  If soap and water are not readily available, use an alcohol-based hand sanitizer with at least 60% alcohol.  [name]  If coughing or sneezing, cover your mouth and nose by coughing or sneezing into the elbow areas of your shirt or coat, into a tissue or into your sleeve (not your hands). . Avoid shaking hands with others and consider head nods or verbal greetings only. . Avoid touching your eyes, nose, or mouth with unwashed hands.  . Avoid close contact with people who are sick. . Avoid places or events with large numbers of people in one location,  like concerts or sporting events. . Carefully consider travel plans you have or are making. . If you are planning any travel outside or inside the [name], visit the CDC's Travelers' Health webpage for the latest health notices. . If you have some symptoms but not all symptoms, continue to monitor at home and seek medical attention if your symptoms worsen. . If you are having a medical emergency, call 911.   [facility] / e-Visit: [email or web address]         [facility]: [facility]: [phone]                   [facility]: [phone]

## 2018-05-19 NOTE — Patient Instructions (Signed)

## 2018-05-20 ENCOUNTER — Other Ambulatory Visit (INDEPENDENT_AMBULATORY_CARE_PROVIDER_SITE_OTHER): Payer: Self-pay | Admitting: Family Medicine

## 2018-05-20 DIAGNOSIS — I10 Essential (primary) hypertension: Secondary | ICD-10-CM

## 2018-05-25 ENCOUNTER — Other Ambulatory Visit: Payer: Self-pay

## 2018-05-25 ENCOUNTER — Encounter (INDEPENDENT_AMBULATORY_CARE_PROVIDER_SITE_OTHER): Payer: Self-pay | Admitting: Family Medicine

## 2018-05-25 ENCOUNTER — Ambulatory Visit (INDEPENDENT_AMBULATORY_CARE_PROVIDER_SITE_OTHER): Payer: Medicare Other | Admitting: Family Medicine

## 2018-05-25 DIAGNOSIS — Z794 Long term (current) use of insulin: Secondary | ICD-10-CM

## 2018-05-25 DIAGNOSIS — F3289 Other specified depressive episodes: Secondary | ICD-10-CM | POA: Diagnosis not present

## 2018-05-25 DIAGNOSIS — I10 Essential (primary) hypertension: Secondary | ICD-10-CM

## 2018-05-25 DIAGNOSIS — E1140 Type 2 diabetes mellitus with diabetic neuropathy, unspecified: Secondary | ICD-10-CM

## 2018-05-25 DIAGNOSIS — Z6830 Body mass index (BMI) 30.0-30.9, adult: Secondary | ICD-10-CM

## 2018-05-25 DIAGNOSIS — E669 Obesity, unspecified: Secondary | ICD-10-CM

## 2018-05-25 MED ORDER — TOPIRAMATE 25 MG PO TABS: 25.0000 mg | ORAL_TABLET | Freq: Every day | ORAL | 0 refills | Status: DC

## 2018-05-25 MED ORDER — INSULIN GLARGINE 100 UNIT/ML SOLOSTAR PEN: 12.0000 [IU] | PEN_INJECTOR | Freq: Every day | SUBCUTANEOUS | 0 refills | Status: DC

## 2018-05-25 MED ORDER — INSULIN PEN NEEDLE 32G X 4 MM MISC: 1.0000 | Freq: Every day | 0 refills | Status: DC

## 2018-05-25 MED ORDER — METFORMIN HCL 500 MG PO TABS: 500.0000 mg | ORAL_TABLET | Freq: Every day | ORAL | 0 refills | Status: DC

## 2018-05-25 MED ORDER — LIRAGLUTIDE 18 MG/3ML ~~LOC~~ SOPN: PEN_INJECTOR | SUBCUTANEOUS | 0 refills | Status: DC

## 2018-05-25 MED ORDER — VORTIOXETINE HBR 20 MG PO TABS: 20.0000 mg | ORAL_TABLET | Freq: Every day | ORAL | 0 refills | Status: DC

## 2018-05-25 MED ORDER — LOSARTAN POTASSIUM 100 MG PO TABS: 100.0000 mg | ORAL_TABLET | Freq: Every day | ORAL | 0 refills | Status: DC

## 2018-05-25 MED ORDER — BUPROPION HCL ER (SR) 200 MG PO TB12: ORAL_TABLET | ORAL | 0 refills | Status: DC

## 2018-05-25 NOTE — Progress Notes (Signed)
Office: [phone]  /  Fax: [phone] TeleHealth Visit:  ABBEGALE STEHLE has verbally consented to this TeleHealth visit today. The patient is located at home, the provider is located at the [facility] office. The participants in this visit include the listed provider and patient. The visit was conducted today via Face Time.  HPI:   Chief Complaint: OBESITY Darlene [name] is here to discuss her progress with her obesity treatment plan. She is keeping a food journal with 1300 to 1500 calories and 80 grams of protein and is following her eating plan approximately 50 to 60 % of the time. She states she is prepping to move households. Darlene [name] feels that she has gained a couple of pounds since her last visit. She is not journaling regularly, but is trying to be mindful. She had a good report from her oncologist and is in the process of moving. Darlene [name] notes some increased celebration eating in the last 2 weeks.  We were unable to weigh the patient today for this TeleHealth visit. She feels as if she has gained weight since her last visit. She has lost 30 lbs since starting treatment with [name].  Diabetes II [name] has a diagnosis of diabetes type II. [name] is overdue for an A1c reading due to [place] isolation. She has increased simple carbs recently, but has not been checking her blood sugar often. Last A1c was 5.8 on 09/03/17 and was well controlled. She has been working on intensive lifestyle modifications including diet, exercise, and weight loss to help control her blood glucose levels. She denies nausea, vomiting, or any hypoglycemic episodes.  Depression with emotional eating behaviors Darlene [name]'s mood has improved greatly with recent news that her breast cancer and liver metastasis has improved. She is excited about moving soon and states that her energy is good. There has been no change in her insomnia. She has been working on behavior modification techniques to help  reduce her emotional eating and has been somewhat successful.   Hypertension Darlene [name] is a 68 y.o. female with hypertension. Darlene [name] has had some increased systolic and decreased diastolic blood pressure at her oncologist office. Her blood pressure had been stable previously and she does not check at home. She is working on weight loss to help control her blood pressure with the goal of decreasing her risk of heart attack and stroke. Darlene [name] denies chest pain or lightheadedness.  ASSESSMENT AND PLAN:  Essential hypertension - Plan: losartan (COZAAR) 100 MG tablet  Type 2 diabetes mellitus with diabetic neuropathy, with long-term current use of insulin (HCC) - Plan: metFORMIN (GLUCOPHAGE) 500 MG tablet, liraglutide (VICTOZA) 18 MG/3ML SOPN, Insulin Glargine (LANTUS SOLOSTAR) 100 UNIT/ML Solostar Pen, Insulin Pen Needle (BD PEN NEEDLE NANO U/F) 32G X 4 MM MISC  Other depression - with emotional eating - Plan: buPROPion (WELLBUTRIN SR) 200 MG 12 hr tablet, topiramate (TOPAMAX) 25 MG tablet, vortioxetine HBr (TRINTELLIX) 20 MG TABS tablet  Class 1 obesity with serious comorbidity and body mass index (BMI) of 30.0 to 30.9 in adult, unspecified obesity type - Starting BMI greater then 30  PLAN:  Diabetes II Darlene [name] has been given extensive diabetes education by myself today including ideal fasting and post-prandial blood glucose readings, individual ideal Hgb A1c goals, and hypoglycemia prevention. We discussed the importance of good blood sugar control to decrease the likelihood of diabetic complications such as nephropathy, neuropathy, limb loss, blindness, coronary artery disease, and death. We discussed the importance of intensive lifestyle modification including diet,  exercise, and weight loss as the first line treatment for diabetes. Sol agrees to continue metformin 500 mg qd # 30, Victoza 1.8 mg qd #3 pens, Lantus 12 units Nelson qd #5 pens, and nanoneedles #100 with no refills.  [name] will follow up at the agreed upon time in 2 weeks.  Hypertension We discussed sodium restriction, working on healthy weight loss, and a regular exercise program as the means to achieve improved blood pressure control. We will continue to monitor her blood pressure as well as her progress with the above lifestyle modifications. She will continue her losartan 100 mg qd #30 with no refills and will watch for signs of hypotension as she continues her lifestyle modifications. [name] agreed to get back to her diet more strictly and she agreed to follow up as directed.  Depression with Emotional Eating Behaviors We discussed behavior modification techniques today to help Darlene [name] deal with her emotional eating and depression. She has agreed to take Wellbutrin SR 200 mg qd at noon #30, topiramate 25 mg qd #30, and Trintellix 20 mg qhs #30 with no refills. Darlene [name] agreed to follow up as directed in 2 weeks.  Obesity Darlene [name] is currently in the action stage of change. As such, her goal is to continue with weight loss efforts. She has agreed to keep a food journal with 1300 to 1500 calories and 80+ grams of protein.  Darlene [name] has been instructed to work up to a goal of 150 minutes of combined cardio and strengthening exercise per week for weight loss and overall health benefits. We discussed the following Behavioral Modification Strategies today: work on meal planning and easy cooking plans and better snacking choices.  Darlene [name] has agreed to follow up with our clinic in 2 weeks. She was informed of the importance of frequent follow up visits to maximize her success with intensive lifestyle modifications for her multiple health conditions.  ALLERGIES: Allergies  Allergen Reactions  . Nsaids Other (See Comments)    H/o gastric bypass - avoid NSAIDs  . Tape Hives, Rash and Other (See Comments)    Adhesives in bandaids    MEDICATIONS: Current Outpatient Medications on File Prior to Visit   Medication Sig Dispense Refill  . acetaminophen (TYLENOL) 325 MG tablet Take 650 mg by mouth 2 (two) times daily as needed for moderate pain or headache.     [name] buPROPion (WELLBUTRIN SR) 200 MG 12 hr tablet TAKE 1 TABLET BY MOUTH DAILY AT 12 NOON. 30 tablet 0  . CALCIUM CITRATE-VITAMIN D PO Take 1 tablet by mouth 3 (three) times daily.     . cholecalciferol (VITAMIN D) 1000 units tablet Take 1,000 Units by mouth daily.    . Cyanocobalamin (VITAMIN B-12 PO) Take 1 tablet by mouth every 14 (fourteen) days.    . diphenhydrAMINE (BENADRYL) 25 MG tablet Take 25 mg by mouth daily as needed for allergies or sleep.     . Insulin Glargine (LANTUS SOLOSTAR) 100 UNIT/ML Solostar Pen Inject 12 Units into the skin daily. 5 pen 0  . Insulin Pen Needle (BD PEN NEEDLE NANO U/F) 32G X 4 MM MISC 1 Package by Does not apply route daily at 12 noon. 100 each 0  . liraglutide (VICTOZA) 18 MG/3ML SOPN INJECT 1.8MG (0.3 MLS) INTO THE SKIN EVERY MORNING 3 pen 0  . loratadine (CLARITIN) 10 MG tablet Take 10 mg by mouth as needed for allergies.    [name] losartan (COZAAR) 100 MG tablet Take 1 tablet (100 mg total) by mouth daily.  30 tablet 0  . Melatonin 3 MG CAPS Take 3 mg by mouth at bedtime.     . metFORMIN (GLUCOPHAGE) 500 MG tablet Take 1 tablet (500 mg total) by mouth daily. 30 tablet 0  . ONETOUCH DELICA LANCETS 35T MISC 1 Package by Does not apply route daily. 100 each 0  . Pediatric Multivitamins-Iron (FLINTSTONES PLUS IRON) chewable tablet Chew 1 tablet by mouth 2 (two) times daily.    . Probiotic Product (CVS PROBIOTIC PO) Take 1 capsule by mouth daily.     . prochlorperazine (COMPAZINE) 10 MG tablet Take 1 tablet (10 mg total) by mouth every 6 (six) hours as needed for nausea or vomiting. 30 tablet 0  . Propylene Glycol (SYSTANE BALANCE OP) Place 1-2 drops into both eyes 3 (three) times daily as needed (for dry eyes).     [name] senna-docusate (SENNA PLUS) 8.6-50 MG tablet Take 1 tablet by mouth daily as needed for  moderate constipation.     . topiramate (TOPAMAX) 25 MG tablet Take 1 tablet (25 mg total) by mouth daily. 30 tablet 0  . vortioxetine HBr (TRINTELLIX) 20 MG TABS tablet Take 1 tablet (20 mg total) by mouth at bedtime. 30 tablet 0   No current facility-administered medications on file prior to visit.     PAST MEDICAL HISTORY: Past Medical History:  Diagnosis Date  . Anemia    hx of - during 1st round chemo  . Anxiety   . Arthritis    in fingers, shoulders  . Back pain   . Balance problem   . Breast cancer ([facility])   . Breast cancer of upper-outer quadrant of right female breast ([facility]) 07/22/2014  . Bunion, right foot   . Clumsiness   . Constipation   . Depression   . Diabetes mellitus without complication ([facility])    01+ years  . Eating disorder    binge eating  . Epistaxis 08/26/2014  . Fatty liver   . HCAP (healthcare-associated pneumonia) 12/18/2014  . Headache   . Heart murmur    never had any problems  . History of radiation therapy 04/05/15-05/19/15   right chest wall was treated to 50.4 Gy in 28 fractions, right mastectomy scar was treated to 10 Gy in 5 fractions  . Hyperlipidemia   . Hypertension   . Joint pain   . Multiple food allergies   . Neuromuscular disorder ([facility])    diabetic neuropathy  . Obesity   . Obstructive sleep apnea on CPAP    uses cpap nightly  . Ovarian cyst rupture    possible  . Pneumonia   . Pneumonia 11/2014  . Radiation 04/05/15-05/19/15   right chest wall 50.4 Gy, mastectomy scar 10 Gy  . Sleep apnea   . Thyroid nodule 06/2014    PAST SURGICAL HISTORY: Past Surgical History:  Procedure Laterality Date  . APPENDECTOMY    . BIOPSY THYROID Bilateral 06/2014   2 nodules - Benign   . BREAST LUMPECTOMY WITH RADIOACTIVE SEED AND SENTINEL LYMPH NODE BIOPSY Right 12/27/2014   Procedure: BREAST LUMPECTOMY WITH RADIOACTIVE SEED AND SENTINEL LYMPH NODE BIOPSY;  Surgeon: Stark Klein, MD;  Location: [facility];  Service: General;   Laterality: Right;  . BREAST REDUCTION SURGERY Bilateral 01/06/2015   Procedure: BILATERAL BREAST REDUCTION, RIGHT ONCOPLASTIC RECONSTRUCTION),LEFT BREAST REDUCTION FOR SYMMETRY;  Surgeon: [name], MD;  Location: [facility];  Service: Plastics;  Laterality: Bilateral;  . BREATH TEK H PYLORI N/A 09/15/2012   Procedure: BREATH TEK  Kandis Ban;  Surgeon: Pedro Earls, MD;  Location: [facility];  Service: General;  Laterality: N/A;  . BUNIONECTOMY Left 12/2013  . CARPAL TUNNEL RELEASE Right   . CARPAL TUNNEL RELEASE Left   . CATARACT EXTRACTION     6/19  . COLONOSCOPY    . DUPUYTREN CONTRACTURE RELEASE    . GASTRIC ROUX-EN-Y N/A 11/02/2012   Procedure: LAPAROSCOPIC ROUX-EN-Y GASTRIC;  Surgeon: Pedro Earls, MD;  Location: [facility];  Service: General;  Laterality: N/A;  . IR GENERIC HISTORICAL  03/18/2016   IR US GUIDE VASC ACCESS LEFT 03/18/2016 Markus Daft, MD [facility]  . IR GENERIC HISTORICAL  03/18/2016   IR FLUORO GUIDE CV LINE LEFT 03/18/2016 Markus Daft, MD [facility]  . LAPAROSCOPIC APPENDECTOMY N/A 07/22/2015   Procedure: APPENDECTOMY LAPAROSCOPIC;  Surgeon: Michael Boston, MD;  Location: [facility];  Service: General;  Laterality: N/A;  . MASTECTOMY Right 02/15/2015   modified  . MODIFIED MASTECTOMY Right 02/15/2015   Procedure: RIGHT MODIFIED RADICAL MASTECTOMY;  Surgeon: Stark Klein, MD;  Location: [facility];  Service: General;  Laterality: Right;  . PORT-A-CATH REMOVAL Left 10/10/2015   Procedure: PORT REMOVAL;  Surgeon: Stark Klein, MD;  Location: [facility];  Service: General;  Laterality: Left;  . PORTACATH PLACEMENT Left 08/02/2014   Procedure: INSERTION PORT-A-CATH;  Surgeon: Stark Klein, MD;  Location: [facility];  Service: General;  Laterality: Left;  . PORTACATH PLACEMENT N/A 11/05/2016   Procedure: INSERTION PORT-A-CATH;  Surgeon: Stark Klein, MD;  Location: [facility];  Service: General;  Laterality: N/A;  . ROTATOR CUFF REPAIR Right 2005  . SCAR REVISION Right  12/08/2015   Procedure: COMPLEX REPAIR RIGHT CHEST 10-15CM;  Surgeon: [name], MD;  Location: [facility];  Service: Plastics;  Laterality: Right;  . TOE SURGERY  1970s   bone spur  . TRIGGER FINGER RELEASE     x3    SOCIAL HISTORY: Social History   Tobacco Use  . Smoking status: Never Smoker  . Smokeless tobacco: Never Used  Substance Use Topics  . Alcohol use: Yes    Alcohol/week: 1.0 standard drinks    Types: 1 Standard drinks or equivalent per week    Comment: social  . Drug use: No    FAMILY HISTORY: Family History  Problem Relation Age of Onset  . CVA Mother   . Heart disease Mother   . Sudden death Mother   . Diabetes Father   . Heart failure Father   . Hypertension Father   . Kidney disease Father   . Obesity Father   . Hypertension Sister   . Diabetes Brother   . Breast cancer Paternal Grandmother 7  . Diabetes Paternal Uncle   . Ovarian cancer Neg Hx     ROS: Review of Systems  Cardiovascular: Negative for chest pain.  Gastrointestinal: Negative for nausea and vomiting.  Neurological:       Negative for lightheadedness.  Endo/Heme/Allergies:       Negative for hypoglycemia.  Psychiatric/Behavioral: Positive for depression. The patient has insomnia.     PHYSICAL EXAM: Pt in no acute distress  RECENT LABS AND TESTS: BMET    Component Value Date/Time   NA 143 05/19/2018 0910   NA 143 03/11/2017 0841   NA 140 01/07/2017 0758   K 4.2 05/19/2018 0910   K 4.1 01/07/2017 0758   CL 112 (H) 05/19/2018 0910   CO2 23 05/19/2018 0910   CO2 24 01/07/2017 0758  GLUCOSE 108 (H) 05/19/2018 0910   GLUCOSE 134 01/07/2017 0758   BUN 26 (H) 05/19/2018 0910   BUN 40 (H) 03/11/2017 0841   BUN 33.6 (H) 01/07/2017 0758   CREATININE 1.54 (H) 05/19/2018 0910   CREATININE 1.62 (H) 04/28/2018 0915   CREATININE 1.5 (H) 01/07/2017 0758   CALCIUM 8.8 (L) 05/19/2018 0910   CALCIUM 9.3 01/07/2017 0758   GFRNONAA 35 (L) 05/19/2018 0910    GFRNONAA 33 (L) 04/28/2018 0915   GFRNONAA 55 (L) 02/18/2013 0827   GFRAA 41 (L) 05/19/2018 0910   GFRAA 38 (L) 04/28/2018 0915   GFRAA 63 02/18/2013 0827   Lab Results  Component Value Date   HGBA1C 5.8 (H) 09/03/2017   HGBA1C 6.1 (H) 03/11/2017   HGBA1C 8.2 (H) 11/05/2016   HGBA1C 8.1 (H) 08/09/2016   HGBA1C 6.5 (H) 01/03/2015   Lab Results  Component Value Date   INSULIN 15.3 03/11/2017   INSULIN 18.3 08/09/2016   CBC    Component Value Date/Time   WBC 3.1 (L) 05/19/2018 0910   RBC 3.03 (L) 05/19/2018 0910   HGB 9.6 (L) 05/19/2018 0910   HGB 10.6 (L) 12/16/2017 0806   HGB 10.8 (L) 01/07/2017 0758   HCT 29.4 (L) 05/19/2018 0910   HCT 31.9 (L) 01/07/2017 0758   PLT 128 (L) 05/19/2018 0910   PLT 83 (L) 12/16/2017 0806   PLT 126 (L) 01/07/2017 0758   MCV 97.0 05/19/2018 0910   MCV 90.8 01/07/2017 0758   MCH 31.7 05/19/2018 0910   MCHC 32.7 05/19/2018 0910   RDW 14.8 05/19/2018 0910   RDW 14.8 (H) 01/07/2017 0758   LYMPHSABS 0.8 05/19/2018 0910   LYMPHSABS 1.1 01/07/2017 0758   MONOABS 0.4 05/19/2018 0910   MONOABS 0.3 01/07/2017 0758   EOSABS 0.1 05/19/2018 0910   EOSABS 0.1 01/07/2017 0758   EOSABS 0.1 08/09/2016 1229   BASOSABS 0.0 05/19/2018 0910   BASOSABS 0.0 01/07/2017 0758   Iron/TIBC/Ferritin/ %Sat    Component Value Date/Time   IRON 58 02/18/2013 0827   TIBC 305 02/18/2013 0827   FERRITIN 133 10/31/2014 0816   IRONPCTSAT 19 (L) 02/18/2013 0827   Lipid Panel     Component Value Date/Time   CHOL 160 09/03/2017 1349   TRIG 111 09/03/2017 1349   HDL 59 09/03/2017 1349   CHOLHDL 2.9 02/18/2013 0827   VLDL 23 02/18/2013 0827   LDLCALC 79 09/03/2017 1349   Hepatic Function Panel     Component Value Date/Time   PROT 6.3 (L) 05/19/2018 0910   PROT 6.9 03/11/2017 0841   PROT 6.7 01/07/2017 0758   ALBUMIN 3.7 05/19/2018 0910   ALBUMIN 4.5 03/11/2017 0841   ALBUMIN 3.6 01/07/2017 0758   AST 44 (H) 05/19/2018 0910   AST 43 (H) 04/28/2018 0915    AST 51 (H) 01/07/2017 0758   ALT 78 (H) 05/19/2018 0910   ALT 61 (H) 04/28/2018 0915   ALT 82 (H) 01/07/2017 0758   ALKPHOS 124 05/19/2018 0910   ALKPHOS 115 01/07/2017 0758   BILITOT 0.5 05/19/2018 0910   BILITOT 0.6 04/28/2018 0915   BILITOT 0.72 01/07/2017 0758      Component Value Date/Time   TSH 1.54 02/07/2017   TSH 2.170 08/09/2016 1229    Results for Altschuler, Larah L "[name]" (MRN [id number]) as of 05/25/2018 10:36  Ref. Range 09/03/2017 13:49  Vitamin D, 25-Hydroxy Latest Ref Range: 30.0 - 100.0 ng/mL 58.3    I, Marcille Blanco, CMA, am acting as Location manager  for Starlyn Skeans, MD I have reviewed the above documentation for accuracy and completeness, and I agree with the above. -Dennard Nip, MD

## 2018-05-26 ENCOUNTER — Inpatient Hospital Stay: Payer: Medicare Other

## 2018-05-26 ENCOUNTER — Telehealth: Payer: Self-pay | Admitting: *Deleted

## 2018-05-26 ENCOUNTER — Inpatient Hospital Stay: Payer: Medicare Other | Attending: Oncology

## 2018-05-26 ENCOUNTER — Other Ambulatory Visit: Payer: Self-pay

## 2018-05-26 ENCOUNTER — Other Ambulatory Visit: Payer: Self-pay | Admitting: *Deleted

## 2018-05-26 VITALS — BP 132/54 | HR 66 | Temp 97.5°F | Resp 18 | Ht 69.0 in | Wt 198.0 lb

## 2018-05-26 DIAGNOSIS — C50411 Malignant neoplasm of upper-outer quadrant of right female breast: Secondary | ICD-10-CM

## 2018-05-26 DIAGNOSIS — C50911 Malignant neoplasm of unspecified site of right female breast: Secondary | ICD-10-CM

## 2018-05-26 DIAGNOSIS — Z171 Estrogen receptor negative status [ER-]: Secondary | ICD-10-CM | POA: Diagnosis not present

## 2018-05-26 DIAGNOSIS — Z5111 Encounter for antineoplastic chemotherapy: Secondary | ICD-10-CM | POA: Insufficient documentation

## 2018-05-26 DIAGNOSIS — C801 Malignant (primary) neoplasm, unspecified: Secondary | ICD-10-CM

## 2018-05-26 DIAGNOSIS — C787 Secondary malignant neoplasm of liver and intrahepatic bile duct: Secondary | ICD-10-CM

## 2018-05-26 DIAGNOSIS — Z95828 Presence of other vascular implants and grafts: Secondary | ICD-10-CM

## 2018-05-26 DIAGNOSIS — Z5112 Encounter for antineoplastic immunotherapy: Secondary | ICD-10-CM | POA: Insufficient documentation

## 2018-05-26 LAB — CBC WITH DIFFERENTIAL/PLATELET
Abs Immature Granulocytes: 0.03 10*3/uL (ref 0.00–0.07)
Basophils Absolute: 0 10*3/uL (ref 0.0–0.1)
Basophils Relative: 1 %
Eosinophils Absolute: 0.1 10*3/uL (ref 0.0–0.5)
Eosinophils Relative: 2 %
HCT: 29.4 % — ABNORMAL LOW (ref 36.0–46.0)
Hemoglobin: 9.5 g/dL — ABNORMAL LOW (ref 12.0–15.0)
Immature Granulocytes: 1 %
Lymphocytes Relative: 30 %
Lymphs Abs: 1 10*3/uL (ref 0.7–4.0)
MCH: 31.7 pg (ref 26.0–34.0)
MCHC: 32.3 g/dL (ref 30.0–36.0)
MCV: 98 fL (ref 80.0–100.0)
Monocytes Absolute: 0.2 10*3/uL (ref 0.1–1.0)
Monocytes Relative: 7 %
Neutro Abs: 2 10*3/uL (ref 1.7–7.7)
Neutrophils Relative %: 59 %
Platelets: 159 10*3/uL (ref 150–400)
RBC: 3 MIL/uL — ABNORMAL LOW (ref 3.87–5.11)
RDW: 14.6 % (ref 11.5–15.5)
WBC: 3.3 10*3/uL — ABNORMAL LOW (ref 4.0–10.5)
nRBC: 0 % (ref 0.0–0.2)

## 2018-05-26 LAB — COMPREHENSIVE METABOLIC PANEL
ALT: 63 U/L — ABNORMAL HIGH (ref 0–44)
AST: 48 U/L — ABNORMAL HIGH (ref 15–41)
Albumin: 3.8 g/dL (ref 3.5–5.0)
Alkaline Phosphatase: 113 U/L (ref 38–126)
Anion gap: 8 (ref 5–15)
BUN: 31 mg/dL — ABNORMAL HIGH (ref 8–23)
CO2: 23 mmol/L (ref 22–32)
Calcium: 9 mg/dL (ref 8.9–10.3)
Chloride: 112 mmol/L — ABNORMAL HIGH (ref 98–111)
Creatinine, Ser: 1.69 mg/dL — ABNORMAL HIGH (ref 0.44–1.00)
GFR calc Af Amer: 36 mL/min — ABNORMAL LOW (ref >60)
GFR calc non Af Amer: 31 mL/min — ABNORMAL LOW (ref >60)
Glucose, Bld: 108 mg/dL — ABNORMAL HIGH (ref 70–99)
Potassium: 4.1 mmol/L (ref 3.5–5.1)
Sodium: 143 mmol/L (ref 135–145)
Total Bilirubin: 0.5 mg/dL (ref 0.3–1.2)
Total Protein: 6.5 g/dL (ref 6.5–8.1)

## 2018-05-26 LAB — CEA (IN HOUSE-CHCC): CEA (CHCC-In House): 1.23 ng/mL (ref 0.00–5.00)

## 2018-05-26 MED ORDER — DIPHENHYDRAMINE HCL 25 MG PO CAPS
25.0000 mg | ORAL_CAPSULE | Freq: Once | ORAL | Status: AC
  Administered 2018-05-26: 25 mg via ORAL

## 2018-05-26 MED ORDER — SODIUM CHLORIDE 0.9% FLUSH
10.0000 mL | INTRAVENOUS | Status: DC | PRN
  Administered 2018-05-26: 10 mL
  Filled 2018-05-26: qty 10

## 2018-05-26 MED ORDER — PROCHLORPERAZINE MALEATE 10 MG PO TABS
10.0000 mg | ORAL_TABLET | Freq: Once | ORAL | Status: AC
  Administered 2018-05-26: 10 mg via ORAL

## 2018-05-26 MED ORDER — ACETAMINOPHEN 325 MG PO TABS
ORAL_TABLET | ORAL | Status: AC
  Filled 2018-05-26: qty 2

## 2018-05-26 MED ORDER — SODIUM CHLORIDE 0.9 % IV SOLN
Freq: Once | INTRAVENOUS | Status: AC
  Administered 2018-05-26: 11:00:00 via INTRAVENOUS
  Filled 2018-05-26: qty 250

## 2018-05-26 MED ORDER — ACETAMINOPHEN 325 MG PO TABS
650.0000 mg | ORAL_TABLET | Freq: Once | ORAL | Status: AC
  Administered 2018-05-26: 650 mg via ORAL

## 2018-05-26 MED ORDER — DIPHENHYDRAMINE HCL 25 MG PO CAPS
ORAL_CAPSULE | ORAL | Status: AC
  Filled 2018-05-26: qty 1

## 2018-05-26 MED ORDER — HEPARIN SOD (PORK) LOCK FLUSH 100 UNIT/ML IV SOLN
500.0000 [IU] | Freq: Once | INTRAVENOUS | Status: AC | PRN
  Administered 2018-05-26: 500 [IU]
  Filled 2018-05-26: qty 5

## 2018-05-26 MED ORDER — SODIUM CHLORIDE 0.9% FLUSH
10.0000 mL | INTRAVENOUS | Status: DC | PRN
  Administered 2018-05-26: 10 mL via INTRAVENOUS
  Filled 2018-05-26: qty 10

## 2018-05-26 MED ORDER — PACLITAXEL PROTEIN-BOUND CHEMO INJECTION 100 MG
80.0000 mg/m2 | Freq: Once | INTRAVENOUS | Status: AC
  Administered 2018-05-26: 175 mg via INTRAVENOUS
  Filled 2018-05-26: qty 35

## 2018-05-26 MED ORDER — TRASTUZUMAB-ANNS CHEMO 420 MG IV SOLR
4.0000 mg/kg | Freq: Once | INTRAVENOUS | Status: AC
  Administered 2018-05-26: 357 mg via INTRAVENOUS
  Filled 2018-05-26: qty 17

## 2018-05-26 MED ORDER — PROCHLORPERAZINE MALEATE 10 MG PO TABS
ORAL_TABLET | ORAL | Status: AC
  Filled 2018-05-26: qty 1

## 2018-05-26 NOTE — Patient Instructions (Signed)
[facility] Discharge Instructions for Patients Receiving Chemotherapy  Today you received the following chemotherapy agents Abraxane, Kanjinti  To help prevent nausea and vomiting after your treatment, we encourage you to take your nausea medication: As directed by MD   If you develop nausea and vomiting that is not controlled by your nausea medication, call the clinic.   BELOW ARE SYMPTOMS THAT SHOULD BE REPORTED IMMEDIATELY:  *FEVER GREATER THAN 100.5 F  *CHILLS WITH OR WITHOUT FEVER  NAUSEA AND VOMITING THAT IS NOT CONTROLLED WITH YOUR NAUSEA MEDICATION  *UNUSUAL SHORTNESS OF BREATH  *UNUSUAL BRUISING OR BLEEDING  TENDERNESS IN MOUTH AND THROAT WITH OR WITHOUT PRESENCE OF ULCERS  *URINARY PROBLEMS  *BOWEL PROBLEMS  UNUSUAL RASH Items with * indicate a potential emergency and should be followed up as soon as possible.  Feel free to call the clinic should you have any questions or concerns. The clinic phone number is [phone].  Please show the [facility] at check-in to the Emergency Department and triage nurse.

## 2018-05-26 NOTE — Telephone Encounter (Signed)
Per MD review of creatinine of 1.69 may proceed with Abraxane as scheduled today.

## 2018-06-02 ENCOUNTER — Encounter: Payer: Self-pay | Admitting: Neurology

## 2018-06-03 ENCOUNTER — Other Ambulatory Visit: Payer: Self-pay | Admitting: Neurology

## 2018-06-03 ENCOUNTER — Telehealth: Payer: Self-pay | Admitting: Neurology

## 2018-06-03 DIAGNOSIS — Z9989 Dependence on other enabling machines and devices: Secondary | ICD-10-CM

## 2018-06-03 DIAGNOSIS — G4733 Obstructive sleep apnea (adult) (pediatric): Secondary | ICD-10-CM

## 2018-06-03 NOTE — Telephone Encounter (Signed)
Order placed and will send to [facility] for you

## 2018-06-03 NOTE — Telephone Encounter (Signed)
   Patiet wrote about air leak through the mouth - oral venting and  Interference from head gear and hair.   Recommendation; " You may go for a nasal pillow  Bella swift with ear loops.  Please google this CPAP mask" you may also consider a N 30 I FFM, ResMed, which doesn't cover the nose. ".  Please let me know if one of these looks worth a trial - CD

## 2018-06-08 ENCOUNTER — Encounter (INDEPENDENT_AMBULATORY_CARE_PROVIDER_SITE_OTHER): Payer: Self-pay | Admitting: Family Medicine

## 2018-06-08 ENCOUNTER — Ambulatory Visit (INDEPENDENT_AMBULATORY_CARE_PROVIDER_SITE_OTHER): Payer: Medicare Other | Admitting: Family Medicine

## 2018-06-08 ENCOUNTER — Other Ambulatory Visit: Payer: Self-pay

## 2018-06-08 DIAGNOSIS — Z6830 Body mass index (BMI) 30.0-30.9, adult: Secondary | ICD-10-CM | POA: Diagnosis not present

## 2018-06-08 DIAGNOSIS — G4709 Other insomnia: Secondary | ICD-10-CM

## 2018-06-08 DIAGNOSIS — E669 Obesity, unspecified: Secondary | ICD-10-CM | POA: Diagnosis not present

## 2018-06-09 ENCOUNTER — Other Ambulatory Visit: Payer: Self-pay

## 2018-06-09 ENCOUNTER — Inpatient Hospital Stay: Payer: Medicare Other

## 2018-06-09 VITALS — BP 131/61 | HR 64 | Temp 98.7°F | Resp 16

## 2018-06-09 DIAGNOSIS — C787 Secondary malignant neoplasm of liver and intrahepatic bile duct: Secondary | ICD-10-CM

## 2018-06-09 DIAGNOSIS — Z171 Estrogen receptor negative status [ER-]: Secondary | ICD-10-CM

## 2018-06-09 DIAGNOSIS — C50411 Malignant neoplasm of upper-outer quadrant of right female breast: Secondary | ICD-10-CM

## 2018-06-09 DIAGNOSIS — Z5112 Encounter for antineoplastic immunotherapy: Secondary | ICD-10-CM | POA: Diagnosis not present

## 2018-06-09 DIAGNOSIS — Z95828 Presence of other vascular implants and grafts: Secondary | ICD-10-CM

## 2018-06-09 DIAGNOSIS — C50911 Malignant neoplasm of unspecified site of right female breast: Secondary | ICD-10-CM

## 2018-06-09 DIAGNOSIS — C801 Malignant (primary) neoplasm, unspecified: Secondary | ICD-10-CM

## 2018-06-09 LAB — CBC WITH DIFFERENTIAL/PLATELET
Abs Immature Granulocytes: 0.03 10*3/uL (ref 0.00–0.07)
Basophils Absolute: 0 10*3/uL (ref 0.0–0.1)
Basophils Relative: 1 %
Eosinophils Absolute: 0.1 10*3/uL (ref 0.0–0.5)
Eosinophils Relative: 2 %
HCT: 28.1 % — ABNORMAL LOW (ref 36.0–46.0)
Hemoglobin: 9.2 g/dL — ABNORMAL LOW (ref 12.0–15.0)
Immature Granulocytes: 1 %
Lymphocytes Relative: 24 %
Lymphs Abs: 0.8 10*3/uL (ref 0.7–4.0)
MCH: 32.2 pg (ref 26.0–34.0)
MCHC: 32.7 g/dL (ref 30.0–36.0)
MCV: 98.3 fL (ref 80.0–100.0)
Monocytes Absolute: 0.4 10*3/uL (ref 0.1–1.0)
Monocytes Relative: 11 %
Neutro Abs: 2.1 10*3/uL (ref 1.7–7.7)
Neutrophils Relative %: 61 %
Platelets: 133 10*3/uL — ABNORMAL LOW (ref 150–400)
RBC: 2.86 MIL/uL — ABNORMAL LOW (ref 3.87–5.11)
RDW: 15.5 % (ref 11.5–15.5)
WBC: 3.4 10*3/uL — ABNORMAL LOW (ref 4.0–10.5)
nRBC: 0 % (ref 0.0–0.2)

## 2018-06-09 LAB — COMPREHENSIVE METABOLIC PANEL
ALT: 59 U/L — ABNORMAL HIGH (ref 0–44)
AST: 38 U/L (ref 15–41)
Albumin: 3.5 g/dL (ref 3.5–5.0)
Alkaline Phosphatase: 112 U/L (ref 38–126)
Anion gap: 8 (ref 5–15)
BUN: 26 mg/dL — ABNORMAL HIGH (ref 8–23)
CO2: 21 mmol/L — ABNORMAL LOW (ref 22–32)
Calcium: 8.7 mg/dL — ABNORMAL LOW (ref 8.9–10.3)
Chloride: 114 mmol/L — ABNORMAL HIGH (ref 98–111)
Creatinine, Ser: 1.61 mg/dL — ABNORMAL HIGH (ref 0.44–1.00)
GFR calc Af Amer: 38 mL/min — ABNORMAL LOW (ref >60)
GFR calc non Af Amer: 33 mL/min — ABNORMAL LOW (ref >60)
Glucose, Bld: 134 mg/dL — ABNORMAL HIGH (ref 70–99)
Potassium: 4 mmol/L (ref 3.5–5.1)
Sodium: 143 mmol/L (ref 135–145)
Total Bilirubin: 0.5 mg/dL (ref 0.3–1.2)
Total Protein: 6.1 g/dL — ABNORMAL LOW (ref 6.5–8.1)

## 2018-06-09 MED ORDER — PACLITAXEL PROTEIN-BOUND CHEMO INJECTION 100 MG
80.0000 mg/m2 | Freq: Once | INTRAVENOUS | Status: AC
  Administered 2018-06-09: 175 mg via INTRAVENOUS
  Filled 2018-06-09: qty 35

## 2018-06-09 MED ORDER — PROCHLORPERAZINE MALEATE 10 MG PO TABS
ORAL_TABLET | ORAL | Status: AC
  Filled 2018-06-09: qty 1

## 2018-06-09 MED ORDER — DIPHENHYDRAMINE HCL 25 MG PO CAPS
ORAL_CAPSULE | ORAL | Status: AC
  Filled 2018-06-09: qty 1

## 2018-06-09 MED ORDER — HEPARIN SOD (PORK) LOCK FLUSH 100 UNIT/ML IV SOLN
500.0000 [IU] | Freq: Once | INTRAVENOUS | Status: AC | PRN
  Administered 2018-06-09: 500 [IU]
  Filled 2018-06-09: qty 5

## 2018-06-09 MED ORDER — ACETAMINOPHEN 325 MG PO TABS
ORAL_TABLET | ORAL | Status: AC
  Filled 2018-06-09: qty 2

## 2018-06-09 MED ORDER — SODIUM CHLORIDE 0.9% FLUSH
10.0000 mL | INTRAVENOUS | Status: DC | PRN
  Administered 2018-06-09: 10 mL
  Filled 2018-06-09: qty 10

## 2018-06-09 MED ORDER — ACETAMINOPHEN 325 MG PO TABS
650.0000 mg | ORAL_TABLET | Freq: Once | ORAL | Status: AC
  Administered 2018-06-09: 650 mg via ORAL

## 2018-06-09 MED ORDER — PROCHLORPERAZINE MALEATE 10 MG PO TABS
10.0000 mg | ORAL_TABLET | Freq: Once | ORAL | Status: AC
  Administered 2018-06-09: 10 mg via ORAL

## 2018-06-09 MED ORDER — TRASTUZUMAB-ANNS CHEMO 420 MG IV SOLR
4.0000 mg/kg | Freq: Once | INTRAVENOUS | Status: AC
  Administered 2018-06-09: 357 mg via INTRAVENOUS
  Filled 2018-06-09: qty 17

## 2018-06-09 MED ORDER — DIPHENHYDRAMINE HCL 25 MG PO CAPS
25.0000 mg | ORAL_CAPSULE | Freq: Once | ORAL | Status: AC
  Administered 2018-06-09: 25 mg via ORAL

## 2018-06-09 MED ORDER — SODIUM CHLORIDE 0.9 % IV SOLN
Freq: Once | INTRAVENOUS | Status: AC
  Administered 2018-06-09: 09:00:00 via INTRAVENOUS
  Filled 2018-06-09: qty 250

## 2018-06-09 MED ORDER — SODIUM CHLORIDE 0.9% FLUSH
10.0000 mL | INTRAVENOUS | Status: DC | PRN
  Administered 2018-06-09: 10 mL via INTRAVENOUS
  Filled 2018-06-09: qty 10

## 2018-06-09 NOTE — Progress Notes (Signed)
Office: [phone]  /  Fax: [phone] TeleHealth Visit:  Darlene [name] has verbally consented to this TeleHealth visit today. The patient is located at home, the provider is located at the [facility] office. The participants in this visit include the listed provider and patient. The visit was conducted today via Face Time.  HPI:   Chief Complaint: OBESITY Darlene [name] is here to discuss her progress with her obesity treatment plan. She is keeping a food journal with 1300 to 1500 calories and 80+ grams of protein and is following her eating plan approximately 65 % of the time. She states she is cleaning out the basement and walking up and down stairs. Darlene [name] has done well with weight loss and thinks that she has lost 2 pounds since our last visit. She is in a good mood and thinks that she has reduced her boredom snacking. Her hunger is controlled.  We were unable to weigh the patient today for this TeleHealth visit. She feels as if she has lost weight since her last visit. She has lost 30 lbs since starting treatment with [name].  Insomnia Darlene [name] stopped Benadryl as I recommended, but finds that she is sleeping well without it. She has been more active with cleaning and packing, and getting ready to move next week.  ASSESSMENT AND PLAN:  Other insomnia  Class 1 obesity with serious comorbidity and body mass index (BMI) of 30.0 to 30.9 in adult, unspecified obesity type - Starting BMI greater then 30  PLAN:  Insomnia The problem of recurrent insomnia was discussed. Avoidance of caffeine sources was strongly encouraged and sleep hygiene issues were reviewed. Darlene [name] is to continue being more active as this is both good for her health and also seems to help her sleep. We will follow her progress and she agrees to follow up in 2 weeks.  I spent > than 50% of the 15 minute visit on counseling as documented in the note.  Obesity Darlene [name] is currently in the action  stage of change. As such, her goal is to continue with weight loss efforts. She has agreed to keep a food journal with 1300 to 1500 calories and 80+ grams of protein.  Darlene [name] has been instructed to work up to a goal of 150 minutes of combined cardio and strengthening exercise per week for weight loss and overall health benefits. We discussed the following Behavioral Modification Strategies today: emotional eating strategies and better snacking choices.  Darlene [name] has agreed to follow up with our clinic in 2 weeks. She was informed of the importance of frequent follow up visits to maximize her success with intensive lifestyle modifications for her multiple health conditions.  ALLERGIES: Allergies  Allergen Reactions  . Nsaids Other (See Comments)    H/o gastric bypass - avoid NSAIDs  . Tape Hives, Rash and Other (See Comments)    Adhesives in bandaids    MEDICATIONS: Current Outpatient Medications on File Prior to Visit  Medication Sig Dispense Refill  . acetaminophen (TYLENOL) 325 MG tablet Take 650 mg by mouth 2 (two) times daily as needed for moderate pain or headache.     [name] buPROPion (WELLBUTRIN SR) 200 MG 12 hr tablet TAKE 1 TABLET BY MOUTH DAILY AT 12 NOON. 30 tablet 0  . CALCIUM CITRATE-VITAMIN D PO Take 1 tablet by mouth 3 (three) times daily.     . cholecalciferol (VITAMIN D) 1000 units tablet Take 1,000 Units by mouth daily.    . Cyanocobalamin (VITAMIN B-12 PO)  Take 1 tablet by mouth every 14 (fourteen) days.    . diphenhydrAMINE (BENADRYL) 25 MG tablet Take 25 mg by mouth daily as needed for allergies or sleep.     . Insulin Glargine (LANTUS SOLOSTAR) 100 UNIT/ML Solostar Pen Inject 12 Units into the skin daily. 5 pen 0  . Insulin Pen Needle (BD PEN NEEDLE NANO U/F) 32G X 4 MM MISC 1 Package by Does not apply route daily at 12 noon. 100 each 0  . liraglutide (VICTOZA) 18 MG/3ML SOPN INJECT 1.8MG (0.3 MLS) INTO THE SKIN EVERY MORNING 3 pen 0  . loratadine (CLARITIN) 10 MG  tablet Take 10 mg by mouth as needed for allergies.    [name] losartan (COZAAR) 100 MG tablet Take 1 tablet (100 mg total) by mouth daily. 30 tablet 0  . Melatonin 3 MG CAPS Take 3 mg by mouth at bedtime.     . metFORMIN (GLUCOPHAGE) 500 MG tablet Take 1 tablet (500 mg total) by mouth daily. 30 tablet 0  . ONETOUCH DELICA LANCETS 97W MISC 1 Package by Does not apply route daily. 100 each 0  . Pediatric Multivitamins-Iron (FLINTSTONES PLUS IRON) chewable tablet Chew 1 tablet by mouth 2 (two) times daily.    . Probiotic Product ([employer] PROBIOTIC PO) Take 1 capsule by mouth daily.     . prochlorperazine (COMPAZINE) 10 MG tablet Take 1 tablet (10 mg total) by mouth every 6 (six) hours as needed for nausea or vomiting. 30 tablet 0  . Propylene Glycol (SYSTANE BALANCE OP) Place 1-2 drops into both eyes 3 (three) times daily as needed (for dry eyes).     [name] senna-docusate (SENNA PLUS) 8.6-50 MG tablet Take 1 tablet by mouth daily as needed for moderate constipation.     . topiramate (TOPAMAX) 25 MG tablet Take 1 tablet (25 mg total) by mouth daily. 30 tablet 0  . vortioxetine HBr (TRINTELLIX) 20 MG TABS tablet Take 1 tablet (20 mg total) by mouth at bedtime. 30 tablet 0   No current facility-administered medications on file prior to visit.     PAST MEDICAL HISTORY: Past Medical History:  Diagnosis Date  . Anemia    hx of - during 1st round chemo  . Anxiety   . Arthritis    in fingers, shoulders  . Back pain   . Balance problem   . Breast cancer ([place])   . Breast cancer of upper-outer quadrant of right female breast ([place]) 07/22/2014  . Bunion, right foot   . Clumsiness   . Constipation   . Depression   . Diabetes mellitus without complication ([place])    26+ years  . Eating disorder    binge eating  . Epistaxis 08/26/2014  . Fatty liver   . HCAP (healthcare-associated pneumonia) 12/18/2014  . Headache   . Heart murmur    never had any problems  . History of radiation therapy 04/05/15-05/19/15   right  chest wall was treated to 50.4 Gy in 28 fractions, right mastectomy scar was treated to 10 Gy in 5 fractions  . Hyperlipidemia   . Hypertension   . Joint pain   . Multiple food allergies   . Neuromuscular disorder ([place])    diabetic neuropathy  . Obesity   . Obstructive sleep apnea on CPAP    uses cpap nightly  . Ovarian cyst rupture    possible  . Pneumonia   . Pneumonia 11/2014  . Radiation 04/05/15-05/19/15   right chest wall 50.4 Gy, mastectomy scar 10  Gy  . Sleep apnea   . Thyroid nodule 06/2014    PAST SURGICAL HISTORY: Past Surgical History:  Procedure Laterality Date  . APPENDECTOMY    . BIOPSY THYROID Bilateral 06/2014   2 nodules - Benign   . BREAST LUMPECTOMY WITH RADIOACTIVE SEED AND SENTINEL LYMPH NODE BIOPSY Right 12/27/2014   Procedure: BREAST LUMPECTOMY WITH RADIOACTIVE SEED AND SENTINEL LYMPH NODE BIOPSY;  Surgeon: Stark Klein, MD;  Location: [place];  Service: General;  Laterality: Right;  . BREAST REDUCTION SURGERY Bilateral 01/06/2015   Procedure: BILATERAL BREAST REDUCTION, RIGHT ONCOPLASTIC RECONSTRUCTION),LEFT BREAST REDUCTION FOR SYMMETRY;  Surgeon: [name], MD;  Location: [place];  Service: Plastics;  Laterality: Bilateral;  . BREATH TEK H PYLORI N/A 09/15/2012   Procedure: BREATH TEK H PYLORI;  Surgeon: Pedro Earls, MD;  Location: [facility];  Service: General;  Laterality: N/A;  . BUNIONECTOMY Left 12/2013  . CARPAL TUNNEL RELEASE Right   . CARPAL TUNNEL RELEASE Left   . CATARACT EXTRACTION     6/19  . COLONOSCOPY    . DUPUYTREN CONTRACTURE RELEASE    . GASTRIC ROUX-EN-Y N/A 11/02/2012   Procedure: LAPAROSCOPIC ROUX-EN-Y GASTRIC;  Surgeon: Pedro Earls, MD;  Location: [facility];  Service: General;  Laterality: N/A;  . IR GENERIC HISTORICAL  03/18/2016   IR US GUIDE VASC ACCESS LEFT 03/18/2016 Markus Daft, MD [facility]  . IR GENERIC HISTORICAL  03/18/2016   IR FLUORO GUIDE CV LINE LEFT 03/18/2016 Markus Daft, MD [facility]  . LAPAROSCOPIC APPENDECTOMY N/A 07/22/2015   Procedure: APPENDECTOMY LAPAROSCOPIC;  Surgeon: Michael Boston, MD;  Location: [facility];  Service: General;  Laterality: N/A;  . MASTECTOMY Right 02/15/2015   modified  . MODIFIED MASTECTOMY Right 02/15/2015   Procedure: RIGHT MODIFIED RADICAL MASTECTOMY;  Surgeon: Stark Klein, MD;  Location: [place];  Service: General;  Laterality: Right;  . PORT-A-CATH REMOVAL Left 10/10/2015   Procedure: PORT REMOVAL;  Surgeon: Stark Klein, MD;  Location: [place];  Service: General;  Laterality: Left;  . PORTACATH PLACEMENT Left 08/02/2014   Procedure: INSERTION PORT-A-CATH;  Surgeon: Stark Klein, MD;  Location: [place];  Service: General;  Laterality: Left;  . PORTACATH PLACEMENT N/A 11/05/2016   Procedure: INSERTION PORT-A-CATH;  Surgeon: Stark Klein, MD;  Location: [place];  Service: General;  Laterality: N/A;  . ROTATOR CUFF REPAIR Right 2005  . SCAR REVISION Right 12/08/2015   Procedure: COMPLEX REPAIR RIGHT CHEST 10-15CM;  Surgeon: [name], MD;  Location: [place];  Service: Plastics;  Laterality: Right;  . TOE SURGERY  1970s   bone spur  . TRIGGER FINGER RELEASE     x3    SOCIAL HISTORY: Social History   Tobacco Use  . Smoking status: Never Smoker  . Smokeless tobacco: Never Used  Substance Use Topics  . Alcohol use: Yes    Alcohol/week: 1.0 standard drinks    Types: 1 Standard drinks or equivalent per week    Comment: social  . Drug use: No    FAMILY HISTORY: Family History  Problem Relation Age of Onset  . CVA Mother   . Heart disease Mother   . Sudden death Mother   . Diabetes Father   . Heart failure Father   . Hypertension Father   . Kidney disease Father   . Obesity Father   . Hypertension Sister   . Diabetes Brother   . Breast cancer Paternal Grandmother 28  . Diabetes  Paternal Uncle   . Ovarian cancer Neg Hx     ROS: Review of Systems  Psychiatric/Behavioral: The patient has  insomnia.     PHYSICAL EXAM: Pt in no acute distress  RECENT LABS AND TESTS: BMET    Component Value Date/Time   NA 143 06/09/2018 0841   NA 143 03/11/2017 0841   NA 140 01/07/2017 0758   K 4.0 06/09/2018 0841   K 4.1 01/07/2017 0758   CL 114 (H) 06/09/2018 0841   CO2 21 (L) 06/09/2018 0841   CO2 24 01/07/2017 0758   GLUCOSE 134 (H) 06/09/2018 0841   GLUCOSE 134 01/07/2017 0758   BUN 26 (H) 06/09/2018 0841   BUN 40 (H) 03/11/2017 0841   BUN 33.6 (H) 01/07/2017 0758   CREATININE 1.61 (H) 06/09/2018 0841   CREATININE 1.62 (H) 04/28/2018 0915   CREATININE 1.5 (H) 01/07/2017 0758   CALCIUM 8.7 (L) 06/09/2018 0841   CALCIUM 9.3 01/07/2017 0758   GFRNONAA 33 (L) 06/09/2018 0841   GFRNONAA 33 (L) 04/28/2018 0915   GFRNONAA 55 (L) 02/18/2013 0827   GFRAA 38 (L) 06/09/2018 0841   GFRAA 38 (L) 04/28/2018 0915   GFRAA 63 02/18/2013 0827   Lab Results  Component Value Date   HGBA1C 5.8 (H) 09/03/2017   HGBA1C 6.1 (H) 03/11/2017   HGBA1C 8.2 (H) 11/05/2016   HGBA1C 8.1 (H) 08/09/2016   HGBA1C 6.5 (H) 01/03/2015   Lab Results  Component Value Date   INSULIN 15.3 03/11/2017   INSULIN 18.3 08/09/2016   CBC    Component Value Date/Time   WBC 3.4 (L) 06/09/2018 0841   RBC 2.86 (L) 06/09/2018 0841   HGB 9.2 (L) 06/09/2018 0841   HGB 10.6 (L) 12/16/2017 0806   HGB 10.8 (L) 01/07/2017 0758   HCT 28.1 (L) 06/09/2018 0841   HCT 31.9 (L) 01/07/2017 0758   PLT 133 (L) 06/09/2018 0841   PLT 83 (L) 12/16/2017 0806   PLT 126 (L) 01/07/2017 0758   MCV 98.3 06/09/2018 0841   MCV 90.8 01/07/2017 0758   MCH 32.2 06/09/2018 0841   MCHC 32.7 06/09/2018 0841   RDW 15.5 06/09/2018 0841   RDW 14.8 (H) 01/07/2017 0758   LYMPHSABS 0.8 06/09/2018 0841   LYMPHSABS 1.1 01/07/2017 0758   MONOABS 0.4 06/09/2018 0841   MONOABS 0.3 01/07/2017 0758   EOSABS 0.1 06/09/2018 0841   EOSABS 0.1 01/07/2017 0758   EOSABS 0.1 08/09/2016 1229   BASOSABS 0.0 06/09/2018 0841   BASOSABS 0.0  01/07/2017 0758   Iron/TIBC/Ferritin/ %Sat    Component Value Date/Time   IRON 58 02/18/2013 0827   TIBC 305 02/18/2013 0827   FERRITIN 133 10/31/2014 0816   IRONPCTSAT 19 (L) 02/18/2013 0827   Lipid Panel     Component Value Date/Time   CHOL 160 09/03/2017 1349   TRIG 111 09/03/2017 1349   HDL 59 09/03/2017 1349   CHOLHDL 2.9 02/18/2013 0827   VLDL 23 02/18/2013 0827   LDLCALC 79 09/03/2017 1349   Hepatic Function Panel     Component Value Date/Time   PROT 6.1 (L) 06/09/2018 0841   PROT 6.9 03/11/2017 0841   PROT 6.7 01/07/2017 0758   ALBUMIN 3.5 06/09/2018 0841   ALBUMIN 4.5 03/11/2017 0841   ALBUMIN 3.6 01/07/2017 0758   AST 38 06/09/2018 0841   AST 43 (H) 04/28/2018 0915   AST 51 (H) 01/07/2017 0758   ALT 59 (H) 06/09/2018 0841   ALT 61 (H) 04/28/2018 0915  ALT 82 (H) 01/07/2017 0758   ALKPHOS 112 06/09/2018 0841   ALKPHOS 115 01/07/2017 0758   BILITOT 0.5 06/09/2018 0841   BILITOT 0.6 04/28/2018 0915   BILITOT 0.72 01/07/2017 0758      Component Value Date/Time   TSH 1.54 02/07/2017   TSH 2.170 08/09/2016 1229   Results for Durr, Giuseppina L "[name]" (MRN [id number]) as of 06/09/2018 15:41  Ref. Range 09/03/2017 13:49  Vitamin D, 25-Hydroxy Latest Ref Range: 30.0 - 100.0 ng/mL 58.3     I, Marcille Blanco, CMA, am acting as transcriptionist for Starlyn Skeans, MD I have reviewed the above documentation for accuracy and completeness, and I agree with the above. -Dennard Nip, MD

## 2018-06-09 NOTE — Patient Instructions (Signed)
[facility] Discharge Instructions for Patients Receiving Chemotherapy  Today you received the following chemotherapy agents Abraxane, Kanjinti  To help prevent nausea and vomiting after your treatment, we encourage you to take your nausea medication: As directed by MD   If you develop nausea and vomiting that is not controlled by your nausea medication, call the clinic.   BELOW ARE SYMPTOMS THAT SHOULD BE REPORTED IMMEDIATELY:  *FEVER GREATER THAN 100.5 F  *CHILLS WITH OR WITHOUT FEVER  NAUSEA AND VOMITING THAT IS NOT CONTROLLED WITH YOUR NAUSEA MEDICATION  *UNUSUAL SHORTNESS OF BREATH  *UNUSUAL BRUISING OR BLEEDING  TENDERNESS IN MOUTH AND THROAT WITH OR WITHOUT PRESENCE OF ULCERS  *URINARY PROBLEMS  *BOWEL PROBLEMS  UNUSUAL RASH Items with * indicate a potential emergency and should be followed up as soon as possible.  Feel free to call the clinic should you have any questions or concerns. The clinic phone number is [phone].  Please show the [place] at check-in to the Emergency Department and triage nurse.

## 2018-06-09 NOTE — Progress Notes (Signed)
Okay to treat today with Creat. 1.61 per Dr. Jana Hakim.

## 2018-06-22 NOTE — Progress Notes (Signed)
[place]  [phone] [phone]    ID: Darlene [name] DOB: 1950-12-05  MR#: [id number]  SWF#:[id number]  Patient Care Team: Starlyn Skeans, MD as PCP - General (Family Medicine) Stark Klein, MD as Consulting Physician (General Surgery) Magrinat, Virgie Dad, MD as Consulting Physician (Oncology) Mauro Kaufmann, RN as Registered Nurse Rockwell Germany, RN as Registered Nurse Jacelyn Pi, MD as Consulting Physician (Endocrinology) [name], MD as Consulting Physician (Plastic Surgery) Larey Dresser, MD as Consulting Physician (Cardiology) Gery Pray, MD as Consulting Physician (Radiation Oncology) Juanita Craver, MD as Consulting Physician (Gastroenterology) Dohmeier, Asencion Partridge, MD as Consulting Physician (Neurology)  OTHER MD:   CHIEF COMPLAINT:  HER-2 positive, estrogen receptor negative locally recurrent disease  CURRENT TREATMENT:  Trastuzumab; Abraxane   INTERVAL HISTORY:  Darlene [name] is seen today for follow-up and treatment of her estrogen receptor negative but HER-2 amplified breast cancer.   She continues on trastuzumab, every 21 days, with a dose due today. She tolerates this well and without any noticeable side effects.  She also continues on abraxane.  This is scheduled days 1 and 8 of each 21-day cycle.  Today is day 1 cycle 10, dose #16 (some doses missed in course so far). She tolerates this well and without any noticeable side effects. She continues to have neuropathy in her feet and denies any changes. She states she feels like she's wearing socks all the time. She denies any neuropathy in her fingers.    [name]'s last echocardiogram on 03/06/2018, showed an ejection fraction in the 55% - 60% range.  We are currently doing echocardiograms every 6 months so the next one will be due mid August.  Since her last visit, she has not undergone any additional studies.   REVIEW OF SYSTEMS: Darlene [name] reports doing well overall.  She notes her hands are cold today. She and her husband moved into their new townhouse about a month ago. Her husband is having trouble with his foot and has been unable to work. She has not been exercising because of the move.   The patient denies unusual headaches, visual changes, nausea, vomiting, stiff neck, dizziness, or gait imbalance. There has been no cough, phlegm production, or pleurisy, no chest pain or pressure, and no change in bowel or bladder habits. The patient denies fever, rash, bleeding, unexplained fatigue or unexplained weight loss. A detailed review of systems was otherwise entirely negative.   Wt Readings from Last 3 Encounters:  06/23/18 197 lb 11.2 oz (89.7 kg)  05/26/18 198 lb (89.8 kg)  05/19/18 201 lb 3.2 oz (91.3 kg)    BREAST CANCER HISTORY: From the original intake note:  "Darlene [name]" had screening mammography at her gynecologist suggestive of a possible abnormality in the right breast. However right diagnostic mammogram 04/12/2013 at [facility] was negative. More recently however, she noted a lump in her right breast and brought it to her gynecologist's attention. On 07/18/2014 the patient had bilateral diagnostic mammography with tomosynthesis at [facility] The breast density was category B. In the right breast upper outer quadrant there was an area of focal asymmetry with indistinct margins.[name] Ultrasound was obtained and showed in addition to the mass in question, measuring 1.7 cm, an abnormal-appearing lymph node in the right axillary tail.  On 07/19/2014 the patient underwent biopsy of the right breast massin question as well as a right axillary lymph node. Both were positive for invasive ductal carcinoma, grade 2, estrogen and progesterone receptor negative, with an MIB-1 of  90%, and HER-2 pending. Incidentally the lymph node biopsy showed a lymphocytic inflammatory component but no lymph node architecture.  Her subsequent history is as detailed above.   PAST MEDICAL  HISTORY: Past Medical History:  Diagnosis Date  . Anemia    hx of - during 1st round chemo  . Anxiety   . Arthritis    in fingers, shoulders  . Back pain   . Balance problem   . Breast cancer ([place])   . Breast cancer of upper-outer quadrant of right female breast ([place]) 07/22/2014  . Bunion, right foot   . Clumsiness   . Constipation   . Depression   . Diabetes mellitus without complication ([place])    31+ years  . Eating disorder    binge eating  . Epistaxis 08/26/2014  . Fatty liver   . HCAP (healthcare-associated pneumonia) 12/18/2014  . Headache   . Heart murmur    never had any problems  . History of radiation therapy 04/05/15-05/19/15   right chest wall was treated to 50.4 Gy in 28 fractions, right mastectomy scar was treated to 10 Gy in 5 fractions  . Hyperlipidemia   . Hypertension   . Joint pain   . Multiple food allergies   . Neuromuscular disorder ([place])    diabetic neuropathy  . Obesity   . Obstructive sleep apnea on CPAP    uses cpap nightly  . Ovarian cyst rupture    possible  . Pneumonia   . Pneumonia 11/2014  . Radiation 04/05/15-05/19/15   right chest wall 50.4 Gy, mastectomy scar 10 Gy  . Sleep apnea   . Thyroid nodule 06/2014    PAST SURGICAL HISTORY: Past Surgical History:  Procedure Laterality Date  . APPENDECTOMY    . BIOPSY THYROID Bilateral 06/2014   2 nodules - Benign   . BREAST LUMPECTOMY WITH RADIOACTIVE SEED AND SENTINEL LYMPH NODE BIOPSY Right 12/27/2014   Procedure: BREAST LUMPECTOMY WITH RADIOACTIVE SEED AND SENTINEL LYMPH NODE BIOPSY;  Surgeon: Stark Klein, MD;  Location: [place];  Service: General;  Laterality: Right;  . BREAST REDUCTION SURGERY Bilateral 01/06/2015   Procedure: BILATERAL BREAST REDUCTION, RIGHT ONCOPLASTIC RECONSTRUCTION),LEFT BREAST REDUCTION FOR SYMMETRY;  Surgeon: [name], MD;  Location: [place];  Service: Plastics;  Laterality: Bilateral;  . BREATH TEK H PYLORI N/A 09/15/2012   Procedure: BREATH  TEK H PYLORI;  Surgeon: Pedro Earls, MD;  Location: [facility];  Service: General;  Laterality: N/A;  . BUNIONECTOMY Left 12/2013  . CARPAL TUNNEL RELEASE Right   . CARPAL TUNNEL RELEASE Left   . CATARACT EXTRACTION     6/19  . COLONOSCOPY    . DUPUYTREN CONTRACTURE RELEASE    . GASTRIC ROUX-EN-Y N/A 11/02/2012   Procedure: LAPAROSCOPIC ROUX-EN-Y GASTRIC;  Surgeon: Pedro Earls, MD;  Location: [facility];  Service: General;  Laterality: N/A;  . IR GENERIC HISTORICAL  03/18/2016   IR US GUIDE VASC ACCESS LEFT 03/18/2016 Markus Daft, MD [facility]  . IR GENERIC HISTORICAL  03/18/2016   IR FLUORO GUIDE CV LINE LEFT 03/18/2016 Markus Daft, MD [facility]  . LAPAROSCOPIC APPENDECTOMY N/A 07/22/2015   Procedure: APPENDECTOMY LAPAROSCOPIC;  Surgeon: Michael Boston, MD;  Location: [facility];  Service: General;  Laterality: N/A;  . MASTECTOMY Right 02/15/2015   modified  . MODIFIED MASTECTOMY Right 02/15/2015   Procedure: RIGHT MODIFIED RADICAL MASTECTOMY;  Surgeon: Stark Klein, MD;  Location: [place];  Service: General;  Laterality: Right;  . PORT-A-CATH REMOVAL  Left 10/10/2015   Procedure: PORT REMOVAL;  Surgeon: Stark Klein, MD;  Location: [place];  Service: General;  Laterality: Left;  . PORTACATH PLACEMENT Left 08/02/2014   Procedure: INSERTION PORT-A-CATH;  Surgeon: Stark Klein, MD;  Location: [place];  Service: General;  Laterality: Left;  . PORTACATH PLACEMENT N/A 11/05/2016   Procedure: INSERTION PORT-A-CATH;  Surgeon: Stark Klein, MD;  Location: [place];  Service: General;  Laterality: N/A;  . ROTATOR CUFF REPAIR Right 2005  . SCAR REVISION Right 12/08/2015   Procedure: COMPLEX REPAIR RIGHT CHEST 10-15CM;  Surgeon: [name], MD;  Location: [place];  Service: Plastics;  Laterality: Right;  . TOE SURGERY  1970s   bone spur  . TRIGGER FINGER RELEASE     x3    FAMILY HISTORY Family History  Problem Relation Age of Onset  . CVA Mother   . Heart  disease Mother   . Sudden death Mother   . Diabetes Father   . Heart failure Father   . Hypertension Father   . Kidney disease Father   . Obesity Father   . Hypertension Sister   . Diabetes Brother   . Breast cancer Paternal Grandmother 47  . Diabetes Paternal Uncle   . Ovarian cancer Neg Hx    The patient's father died from complications of diabetes at age 4. The patient's mother died at age 42 with a subarachnoid hemorrhage. The patient had one brother, one sister. The only breast cancer was the patient's paternal grandmother diagnosed in her 83s. There is no history of ovarian cancer in the family.   GYNECOLOGIC HISTORY:  Patient's last menstrual period was 10/22/2007.  menarche age 25, the patient is GX P0. She went through the change of life in 2011. She did not take hormone replacement.   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 Her husband [name] is a retired Visual merchandiser (he taught at [employer]).  In May 2020 [place].  [name] has 2 children from an earlier marriage, Rockey Situ who teaches in [place], and Christmas Faraci,  who works at the [employer] in in [place]. He has 1 grand son, aged 22. The patient and her husband are not church attenders.   ADVANCED DIRECTIVES:  Not in place   HEALTH MAINTENANCE: Social History   Tobacco Use  . Smoking status: Never Smoker  . Smokeless tobacco: Never Used  Substance Use Topics  . Alcohol use: Yes    Alcohol/week: 1.0 standard drinks    Types: 1 Standard drinks or equivalent per week    Comment: social  . Drug use: No     Colonoscopy:  May 2016  PAP:  Bone density: 07/01/2016 at [facility] showed normal, with a T score of -0.7  Lipid panel:  Allergies  Allergen Reactions  . Nsaids Other (See Comments)    H/o  gastric bypass - avoid NSAIDs  . Tape Hives, Rash and Other (See Comments)    Adhesives in bandaids    Current Outpatient Medications  Medication Sig Dispense Refill  . acetaminophen (TYLENOL) 325 MG tablet Take 650 mg by mouth 2 (two) times daily as needed for moderate pain or headache.     [name] buPROPion (WELLBUTRIN SR) 200 MG 12 hr tablet TAKE 1 TABLET BY MOUTH DAILY AT 12 NOON.  30 tablet 0  . CALCIUM CITRATE-VITAMIN D PO Take 1 tablet by mouth 3 (three) times daily.     . cholecalciferol (VITAMIN D) 1000 units tablet Take 1,000 Units by mouth daily.    . Cyanocobalamin (VITAMIN B-12 PO) Take 1 tablet by mouth every 14 (fourteen) days.    . diphenhydrAMINE (BENADRYL) 25 MG tablet Take 25 mg by mouth daily as needed for allergies or sleep.     . Insulin Glargine (LANTUS SOLOSTAR) 100 UNIT/ML Solostar Pen Inject 12 Units into the skin daily. 5 pen 0  . Insulin Pen Needle (BD PEN NEEDLE NANO U/F) 32G X 4 MM MISC 1 Package by Does not apply route daily at 12 noon. 100 each 0  . liraglutide (VICTOZA) 18 MG/3ML SOPN INJECT 1.8MG(0.3 MLS) INTO THE SKIN EVERY MORNING 3 pen 0  . loratadine (CLARITIN) 10 MG tablet Take 10 mg by mouth as needed for allergies.    [name] losartan (COZAAR) 100 MG tablet Take 1 tablet (100 mg total) by mouth daily. 30 tablet 0  . Melatonin 3 MG CAPS Take 3 mg by mouth at bedtime.     . metFORMIN (GLUCOPHAGE) 500 MG tablet Take 1 tablet (500 mg total) by mouth daily. 30 tablet 0  . ONETOUCH DELICA LANCETS 95G MISC 1 Package by Does not apply route daily. 100 each 0  . Pediatric Multivitamins-Iron (FLINTSTONES PLUS IRON) chewable tablet Chew 1 tablet by mouth 2 (two) times daily.    . Probiotic Product ([employer]S PROBIOTIC PO) Take 1 capsule by mouth daily.     . prochlorperazine (COMPAZINE) 10 MG tablet Take 1 tablet (10 mg total) by mouth every 6 (six) hours as needed for nausea or vomiting. 30 tablet 0  . Propylene Glycol (SYSTANE BALANCE OP) Place 1-2 drops into both eyes 3 (three)  times daily as needed (for dry eyes).     [name] senna-docusate (SENNA PLUS) 8.6-50 MG tablet Take 1 tablet by mouth daily as needed for moderate constipation.     . topiramate (TOPAMAX) 25 MG tablet Take 1 tablet (25 mg total) by mouth daily. 30 tablet 0  . vortioxetine HBr (TRINTELLIX) 20 MG TABS tablet Take 1 tablet (20 mg total) by mouth at bedtime. 30 tablet 0   No current facility-administered medications for this visit.     OBJECTIVE: Latest white woman in no acute distress  Vitals:   06/23/18 0850  BP: (!) 140/51  Pulse: 60  Resp: 18  Temp: 98.2 F (36.8 C)  SpO2: 100%     Body mass index is 29.2 kg/m.    ECOG FS:1 - Symptomatic but completely ambulatory Filed Weights   06/23/18 0850  Weight: 197 lb 11.2 oz (89.7 kg)   Sclerae unicteric, EOMs intact Wearing a mask No cervical or supraclavicular adenopathy Lungs no rales or rhonchi Heart regular rate and rhythm Abd soft, nontender, positive bowel sounds MSK no focal spinal tenderness, no upper extremity lymphedema Neuro: nonfocal, well oriented, appropriate affect Breasts: Status post right mastectomy with no evidence of chest wall recurrence; left breast is unremarkable.   LAB RESULTS:  CMP     Component Value Date/Time   NA 141 06/23/2018 0826   NA 143 03/11/2017 0841   NA 140 01/07/2017 0758   K 4.5 06/23/2018 0826   K 4.1 01/07/2017 0758   CL 113 (H) 06/23/2018 0826   CO2 21 (L) 06/23/2018 0826   CO2 24 01/07/2017 0758   GLUCOSE 246 (H) 06/23/2018 0826   GLUCOSE  134 01/07/2017 0758   BUN 28 (H) 06/23/2018 0826   BUN 40 (H) 03/11/2017 0841   BUN 33.6 (H) 01/07/2017 0758   CREATININE 1.64 (H) 06/23/2018 0826   CREATININE 1.62 (H) 04/28/2018 0915   CREATININE 1.5 (H) 01/07/2017 0758   CALCIUM 8.7 (L) 06/23/2018 0826   CALCIUM 9.3 01/07/2017 0758   PROT 6.5 06/23/2018 0826   PROT 6.9 03/11/2017 0841   PROT 6.7 01/07/2017 0758   ALBUMIN 3.8 06/23/2018 0826   ALBUMIN 4.5 03/11/2017 0841   ALBUMIN 3.6  01/07/2017 0758   AST 46 (H) 06/23/2018 0826   AST 43 (H) 04/28/2018 0915   AST 51 (H) 01/07/2017 0758   ALT 66 (H) 06/23/2018 0826   ALT 61 (H) 04/28/2018 0915   ALT 82 (H) 01/07/2017 0758   ALKPHOS 116 06/23/2018 0826   ALKPHOS 115 01/07/2017 0758   BILITOT 0.6 06/23/2018 0826   BILITOT 0.6 04/28/2018 0915   BILITOT 0.72 01/07/2017 0758   GFRNONAA 32 (L) 06/23/2018 0826   GFRNONAA 33 (L) 04/28/2018 0915   GFRNONAA 55 (L) 02/18/2013 0827   GFRAA 38 (L) 06/23/2018 0826   GFRAA 38 (L) 04/28/2018 0915   GFRAA 63 02/18/2013 0827    INo results found for: SPEP, UPEP  Lab Results  Component Value Date   WBC 3.6 (L) 06/23/2018   NEUTROABS 2.5 06/23/2018   HGB 9.9 (L) 06/23/2018   HCT 31.4 (L) 06/23/2018   MCV 100.0 06/23/2018   PLT 130 (L) 06/23/2018      Chemistry      Component Value Date/Time   NA 141 06/23/2018 0826   NA 143 03/11/2017 0841   NA 140 01/07/2017 0758   K 4.5 06/23/2018 0826   K 4.1 01/07/2017 0758   CL 113 (H) 06/23/2018 0826   CO2 21 (L) 06/23/2018 0826   CO2 24 01/07/2017 0758   BUN 28 (H) 06/23/2018 0826   BUN 40 (H) 03/11/2017 0841   BUN 33.6 (H) 01/07/2017 0758   CREATININE 1.64 (H) 06/23/2018 0826   CREATININE 1.62 (H) 04/28/2018 0915   CREATININE 1.5 (H) 01/07/2017 0758      Component Value Date/Time   CALCIUM 8.7 (L) 06/23/2018 0826   CALCIUM 9.3 01/07/2017 0758   ALKPHOS 116 06/23/2018 0826   ALKPHOS 115 01/07/2017 0758   AST 46 (H) 06/23/2018 0826   AST 43 (H) 04/28/2018 0915   AST 51 (H) 01/07/2017 0758   ALT 66 (H) 06/23/2018 0826   ALT 61 (H) 04/28/2018 0915   ALT 82 (H) 01/07/2017 0758   BILITOT 0.6 06/23/2018 0826   BILITOT 0.6 04/28/2018 0915   BILITOT 0.72 01/07/2017 0758       No results found for: LABCA2  No components found for: [id number]  No results for input(s): INR in the last 168 hours.  Urinalysis    Component Value Date/Time   COLORURINE YELLOW 07/21/2015 [place] 07/21/2015 2328    LABSPEC 1.020 07/21/2015 2328   PHURINE 6.0 07/21/2015 2328   GLUCOSEU NEGATIVE 07/21/2015 2328   HGBUR TRACE (A) 07/21/2015 2328   BILIRUBINUR NEGATIVE 07/21/2015 2328   BILIRUBINUR neg 06/29/2013 1017   KETONESUR NEGATIVE 07/21/2015 2328   PROTEINUR 30 (A) 07/21/2015 2328   UROBILINOGEN negative 06/29/2013 1017   NITRITE NEGATIVE 07/21/2015 2328   LEUKOCYTESUR NEGATIVE 07/21/2015 2328    STUDIES: No results found.   ASSESSMENT: 68 y.o. [place] woman status post right breast upper outer quadrant and right axillary lymph node  biopsy 07/19/2014 for a cT2  pN1, stage IIB  invasive ductal carcinoma, grade 2, estrogen and progesterone receptor negative, with an MIB-1 of 90%, and HER-2 amplified  (1) neoadjuvant chemotherapy started 08/08/2014, consisting of carboplatin, docetaxel, trastuzumab and pertuzumab given every 3 weeks 6, completed 11/21/2014  (a) breast MRI after initial 3 cycles shows a significant response  (b) breast MRI after 6 cycles shows a complete radiologic response   (2) trastuzumab continued through 10/02/2015 (to end of capecitabine therapy)  (a) echo 02/13/2015 shows an EF of 60-65%  (b)  echo 05/09/2015 shows an ejection fraction of 60-65%  (c) echocardiogram 06/27/2015 shows an ejection fraction of 55-60%.  (d) echocardiogram 01/22/2017 shows an ejection fraction of 55-60%  (3) right lumpectomy with sentinel lymph node biopsy on 12/27/14 showed a residual  ypT2 ypN1a, stage IIB invasive ductal carcinoma, grade 2, with repeat estrogen and progesterone receptors again negative  (4) status post right oncoplastic surgery (with left reduction mammoplasty) 01/06/2015 showing in the right breast multiple foci of intralymphatic carcinoma in random sections  (5)  Right modified radical mastectomy 02/15/2014 showed no residual invasive disease; there was DCIS but margins were negative; all 15 axillary lymph nodes were clear; ypTis ypN0  (a)  The patient has decided  against reconstruction.  (6)  Postmastectomy radiation completed 05/18/2015 with capecitabine sensitization  (7) adjuvant capecitabine continued through 09/27/2015  (8) hepatic steatosis with increased fibrosis score (at risk for cirrhosis)  (9) thyroid biopsy (left lobe inferiorly) 2 shows benign tissue 06/28/2015  (10) LOCAL RECURRENCE:  (a) PET scan 12/06/2015 Notes a right retropectoral lymph node measuring 0.9 cm with an SUV max of 5.5  (b) repeat PET scan 03/11/2016 finds the right subpectoral lymph node to now measure 1.3 cm with an SUV max of 11.8.  (c) PET scan 05/07/2016 shows a significant response to T-DM 1  (d) PET scan 07/10/2017 is clear except for what appears to be a single new liver lesion   (11) started T-DM1 03/21/2016, completed 8 doses 08/13/2016  (a) PET scan 08/19/2016 shows a good response but measurable residual disease  (b) PET scan on 01/16/2017 shows a continuing response  (12) maintenance trastuzumab started 09/03/2016--changed to every 28 days as of 01/28/2017  (a) repeat echocardiogram 07/08/2016 shows an ejection fraction of 60-65%.  (b) echocardiogram on 10/08/2016 demonstrates LVEF of 55-60% (followed by Dr. Haroldine Laws)  (c) echo 01/22/2017 shows an ejection fraction in the 55-60% range  (d) echo 04/24/2017 shows an ejection fraction in the 55-60% range (recommendation for echo every 6 months by Dr. Aundra Dubin)  (e) trastuzumab changed to T-DM 1 after trastuzumab dose 07/15/2017  METASTATIC DISEASE: July 2019 (13)  Radiation to the area of persistent disease in the right upper chest completed 10/30/2016   (14) T-DM 1 resumed 08/12/2017, repeated every 21 days  (a) baseline liver MRI 08/02/2017 shows a 1.4 cm mass, "hot" on PET 07/10/2017  (b) repeat liver MRI 11/18/2017 after 3 cycles of T-DM 1, shows slight enlargement  (c) T-DM 1 discontinued after 09/23/2017 dose  (15) trastuzumab resumed 11/18/2017, to be repeated every 4 weeks,   (a) changed back  to every 3 weeks in 03/2018  (16) liver biopsy 11/03/2017 shows adenocarcinoma, most consistent with an upper gastrointestinal primary; this tumor was estrogen receptor and HER-2 negative  (a) GI workup for occult primary Collene Mares) negative  (b) PET 12/09/2017 shows no obvious primary.  There is a right subpectoral lymph node noted as well  (c) CA 27-29 and  CA 19-9 uninformative; CEA 6.72 OCT 2019   (17) started Abraxane on 12/111/2019, given days 1 and 8 on a 21 day cycle   (a) restaging with MRI liver 02/24/2018 shows a gratifying initial response  (b) PET scan 05/14/2018 shows resolution of the measurable disease in the liver  (18) consider Yttrium or other local treatment to liver per IR if liver recurrence develops   PLAN: Darlene [name] is now nearly a year out from definitive diagnosis of metastatic breast cancer, with very well-controlled disease.  She is tolerating the Herceptin and Abraxane quite well.  The plan is to continue that every 14days.  Of course the big concern with Abraxane is the development of neuropathy.  Especially in this patient with history of diabetes and already having some neuropathy involving her feet, I do not think we will be able to continue Abraxane indefinitely.  I am going to see her again late August.  Before that visit she will have an MRI of the liver and if that is favorable we might consider stopping the Abraxane and continuing the anti-HER-2 treatments.  She will also have an echocardiogram prior to that visit  I have encouraged her to exercise regularly  She knows to call for any other issues that may develop before the next visit.    Virgie Dad. Magrinat, MD  06/23/18 9:40 AM Medical Oncology and Hematology [facility] [address] [place], [place] [address] Tel. [phone]    Fax. [phone]   I, Wilburn Mylar, am acting as scribe for Dr. Virgie Dad. Magrinat.  I, Lurline Del MD, have reviewed the above documentation for  accuracy and completeness, and I agree with the above.

## 2018-06-23 ENCOUNTER — Other Ambulatory Visit: Payer: Self-pay | Admitting: Pharmacist

## 2018-06-23 ENCOUNTER — Inpatient Hospital Stay: Payer: Medicare Other | Attending: Oncology

## 2018-06-23 ENCOUNTER — Ambulatory Visit (INDEPENDENT_AMBULATORY_CARE_PROVIDER_SITE_OTHER): Payer: Medicare Other | Admitting: Family Medicine

## 2018-06-23 ENCOUNTER — Other Ambulatory Visit: Payer: Self-pay

## 2018-06-23 ENCOUNTER — Inpatient Hospital Stay: Payer: Medicare Other

## 2018-06-23 ENCOUNTER — Inpatient Hospital Stay (HOSPITAL_BASED_OUTPATIENT_CLINIC_OR_DEPARTMENT_OTHER): Payer: Medicare Other | Admitting: Oncology

## 2018-06-23 ENCOUNTER — Encounter (INDEPENDENT_AMBULATORY_CARE_PROVIDER_SITE_OTHER): Payer: Self-pay | Admitting: Family Medicine

## 2018-06-23 VITALS — BP 140/51 | HR 60 | Temp 98.2°F | Resp 18 | Ht 69.0 in | Wt 197.7 lb

## 2018-06-23 DIAGNOSIS — C50911 Malignant neoplasm of unspecified site of right female breast: Secondary | ICD-10-CM

## 2018-06-23 DIAGNOSIS — Z5112 Encounter for antineoplastic immunotherapy: Secondary | ICD-10-CM | POA: Insufficient documentation

## 2018-06-23 DIAGNOSIS — Z171 Estrogen receptor negative status [ER-]: Secondary | ICD-10-CM

## 2018-06-23 DIAGNOSIS — C7989 Secondary malignant neoplasm of other specified sites: Secondary | ICD-10-CM

## 2018-06-23 DIAGNOSIS — C773 Secondary and unspecified malignant neoplasm of axilla and upper limb lymph nodes: Secondary | ICD-10-CM | POA: Diagnosis not present

## 2018-06-23 DIAGNOSIS — Z794 Long term (current) use of insulin: Secondary | ICD-10-CM | POA: Diagnosis not present

## 2018-06-23 DIAGNOSIS — C50411 Malignant neoplasm of upper-outer quadrant of right female breast: Secondary | ICD-10-CM

## 2018-06-23 DIAGNOSIS — Z95828 Presence of other vascular implants and grafts: Secondary | ICD-10-CM

## 2018-06-23 DIAGNOSIS — C787 Secondary malignant neoplasm of liver and intrahepatic bile duct: Secondary | ICD-10-CM

## 2018-06-23 DIAGNOSIS — E669 Obesity, unspecified: Secondary | ICD-10-CM | POA: Diagnosis not present

## 2018-06-23 DIAGNOSIS — F3289 Other specified depressive episodes: Secondary | ICD-10-CM | POA: Diagnosis not present

## 2018-06-23 DIAGNOSIS — Z5111 Encounter for antineoplastic chemotherapy: Secondary | ICD-10-CM | POA: Diagnosis present

## 2018-06-23 DIAGNOSIS — K760 Fatty (change of) liver, not elsewhere classified: Secondary | ICD-10-CM

## 2018-06-23 DIAGNOSIS — Z6830 Body mass index (BMI) 30.0-30.9, adult: Secondary | ICD-10-CM

## 2018-06-23 DIAGNOSIS — E1140 Type 2 diabetes mellitus with diabetic neuropathy, unspecified: Secondary | ICD-10-CM

## 2018-06-23 DIAGNOSIS — C801 Malignant (primary) neoplasm, unspecified: Secondary | ICD-10-CM

## 2018-06-23 LAB — COMPREHENSIVE METABOLIC PANEL
ALT: 66 U/L — ABNORMAL HIGH (ref 0–44)
AST: 46 U/L — ABNORMAL HIGH (ref 15–41)
Albumin: 3.8 g/dL (ref 3.5–5.0)
Alkaline Phosphatase: 116 U/L (ref 38–126)
Anion gap: 7 (ref 5–15)
BUN: 28 mg/dL — ABNORMAL HIGH (ref 8–23)
CO2: 21 mmol/L — ABNORMAL LOW (ref 22–32)
Calcium: 8.7 mg/dL — ABNORMAL LOW (ref 8.9–10.3)
Chloride: 113 mmol/L — ABNORMAL HIGH (ref 98–111)
Creatinine, Ser: 1.64 mg/dL — ABNORMAL HIGH (ref 0.44–1.00)
GFR calc Af Amer: 38 mL/min — ABNORMAL LOW (ref >60)
GFR calc non Af Amer: 32 mL/min — ABNORMAL LOW (ref >60)
Glucose, Bld: 246 mg/dL — ABNORMAL HIGH (ref 70–99)
Potassium: 4.5 mmol/L (ref 3.5–5.1)
Sodium: 141 mmol/L (ref 135–145)
Total Bilirubin: 0.6 mg/dL (ref 0.3–1.2)
Total Protein: 6.5 g/dL (ref 6.5–8.1)

## 2018-06-23 LAB — CBC WITH DIFFERENTIAL/PLATELET
Abs Immature Granulocytes: 0.02 10*3/uL (ref 0.00–0.07)
Basophils Absolute: 0 10*3/uL (ref 0.0–0.1)
Basophils Relative: 0 %
Eosinophils Absolute: 0.1 10*3/uL (ref 0.0–0.5)
Eosinophils Relative: 4 %
HCT: 31.4 % — ABNORMAL LOW (ref 36.0–46.0)
Hemoglobin: 9.9 g/dL — ABNORMAL LOW (ref 12.0–15.0)
Immature Granulocytes: 1 %
Lymphocytes Relative: 19 %
Lymphs Abs: 0.7 10*3/uL (ref 0.7–4.0)
MCH: 31.5 pg (ref 26.0–34.0)
MCHC: 31.5 g/dL (ref 30.0–36.0)
MCV: 100 fL (ref 80.0–100.0)
Monocytes Absolute: 0.3 10*3/uL (ref 0.1–1.0)
Monocytes Relative: 7 %
Neutro Abs: 2.5 10*3/uL (ref 1.7–7.7)
Neutrophils Relative %: 69 %
Platelets: 130 10*3/uL — ABNORMAL LOW (ref 150–400)
RBC: 3.14 MIL/uL — ABNORMAL LOW (ref 3.87–5.11)
RDW: 15.6 % — ABNORMAL HIGH (ref 11.5–15.5)
WBC: 3.6 10*3/uL — ABNORMAL LOW (ref 4.0–10.5)
nRBC: 0 % (ref 0.0–0.2)

## 2018-06-23 MED ORDER — TRASTUZUMAB-ANNS CHEMO 420 MG IV SOLR
4.0000 mg/kg | Freq: Once | INTRAVENOUS | Status: AC
  Administered 2018-06-23: 357 mg via INTRAVENOUS
  Filled 2018-06-23: qty 17

## 2018-06-23 MED ORDER — SODIUM CHLORIDE 0.9% FLUSH
10.0000 mL | INTRAVENOUS | Status: DC | PRN
  Administered 2018-06-23: 10 mL via INTRAVENOUS
  Filled 2018-06-23: qty 10

## 2018-06-23 MED ORDER — HEPARIN SOD (PORK) LOCK FLUSH 100 UNIT/ML IV SOLN
500.0000 [IU] | Freq: Once | INTRAVENOUS | Status: AC | PRN
  Administered 2018-06-23: 500 [IU]
  Filled 2018-06-23: qty 5

## 2018-06-23 MED ORDER — PROCHLORPERAZINE MALEATE 10 MG PO TABS
10.0000 mg | ORAL_TABLET | Freq: Once | ORAL | Status: AC
  Administered 2018-06-23: 10 mg via ORAL

## 2018-06-23 MED ORDER — BUPROPION HCL ER (SR) 200 MG PO TB12: ORAL_TABLET | ORAL | 0 refills | Status: DC

## 2018-06-23 MED ORDER — PACLITAXEL PROTEIN-BOUND CHEMO INJECTION 100 MG
80.0000 mg/m2 | Freq: Once | Status: DC
  Filled 2018-06-23: qty 35

## 2018-06-23 MED ORDER — INSULIN GLARGINE 100 UNIT/ML SOLOSTAR PEN: 12.0000 [IU] | PEN_INJECTOR | Freq: Every day | SUBCUTANEOUS | 0 refills | Status: DC

## 2018-06-23 MED ORDER — DIPHENHYDRAMINE HCL 25 MG PO CAPS
25.0000 mg | ORAL_CAPSULE | Freq: Once | ORAL | Status: AC
  Administered 2018-06-23: 25 mg via ORAL

## 2018-06-23 MED ORDER — ACETAMINOPHEN 325 MG PO TABS
ORAL_TABLET | ORAL | Status: AC
  Filled 2018-06-23: qty 2

## 2018-06-23 MED ORDER — LIRAGLUTIDE 18 MG/3ML ~~LOC~~ SOPN: PEN_INJECTOR | SUBCUTANEOUS | 0 refills | Status: DC

## 2018-06-23 MED ORDER — SODIUM CHLORIDE 0.9 % IV SOLN
Freq: Once | INTRAVENOUS | Status: AC
  Administered 2018-06-23: 11:00:00 via INTRAVENOUS
  Filled 2018-06-23: qty 250

## 2018-06-23 MED ORDER — PACLITAXEL PROTEIN-BOUND CHEMO INJECTION 100 MG
80.0000 mg/m2 | Freq: Once | INTRAVENOUS | Status: AC
  Administered 2018-06-23: 175 mg via INTRAVENOUS
  Filled 2018-06-23: qty 35

## 2018-06-23 MED ORDER — ACETAMINOPHEN 325 MG PO TABS
650.0000 mg | ORAL_TABLET | Freq: Once | ORAL | Status: AC
  Administered 2018-06-23: 650 mg via ORAL

## 2018-06-23 MED ORDER — DIPHENHYDRAMINE HCL 25 MG PO CAPS
ORAL_CAPSULE | ORAL | Status: AC
  Filled 2018-06-23: qty 1

## 2018-06-23 MED ORDER — SODIUM CHLORIDE 0.9% FLUSH
10.0000 mL | INTRAVENOUS | Status: DC | PRN
  Administered 2018-06-23: 10 mL
  Filled 2018-06-23: qty 10

## 2018-06-23 MED ORDER — PROCHLORPERAZINE MALEATE 10 MG PO TABS
ORAL_TABLET | ORAL | Status: AC
  Filled 2018-06-23: qty 1

## 2018-06-23 MED ORDER — METFORMIN HCL 500 MG PO TABS: 500.0000 mg | ORAL_TABLET | Freq: Every day | ORAL | 0 refills | Status: DC

## 2018-06-23 NOTE — Progress Notes (Signed)
Per Dr. Jana Hakim, okay to treat with Scr. of 1.64

## 2018-06-23 NOTE — Progress Notes (Signed)
Office: [phone]  /  Fax: [phone] TeleHealth Visit:  Darlene [name] has verbally consented to this TeleHealth visit today. The patient is located at home, the provider is located at the [facility] office. The participants in this visit include the listed provider and patient. The visit was conducted today via phone due to facetime failing.  HPI:   Chief Complaint: OBESITY Darlene [name] is here to discuss her progress with her obesity treatment plan. She is on the  keep a food journal with 1300-1500 calories and 80+g of protein  daily and is following her eating plan approximately 10 % of the time. She states she is exercising by moving households.   Darlene [name] moved last week and has had to eat out more often. She thinks she has maintained her weight during this time but she states she is ready to get back on track.   We were unable to weigh the patient today for this TeleHealth visit. She feels as if she has maintained weight since her last visit. She has lost 30 lbs since starting treatment with [name].  Diabetes II Darlene [name] has a diagnosis of diabetes type II. Darlene [name] states she has been eating out while moving and blood glucose have increaed to 250s fasting. She knows this is due to increased eating and plans to get back on track with her eating plan. She  denies any hypoglycemic episodes. Last A1c was 5.8. She has been working on intensive lifestyle modifications including diet, exercise, and weight loss to help control her blood glucose levels.  Depression with emotional eating behaviors Darlene [name] is struggling with emotional eating and using food for comfort to the extent that it is negatively impacting her health. She often snacks when she is not hungry. Darlene [name] sometimes feels she is out of control and then feels guilty that she made poor food choices. She has been working on behavior modification techniques to help reduce her emotional eating and has been somewhat  successful. She shows no sign of suicidal or homicidal ideations. Her mood is stable on Wellbutrin, she reports sleeping well and denies palpitations or lightheadedness.    ASSESSMENT AND PLAN:  Type 2 diabetes mellitus with diabetic neuropathy, with long-term current use of insulin ([place]) - Plan: metFORMIN (GLUCOPHAGE) 500 MG tablet, liraglutide (VICTOZA) 18 MG/3ML SOPN, Insulin Glargine (LANTUS SOLOSTAR) 100 UNIT/ML Solostar Pen  Other depression - with emotional eating - Plan: buPROPion (WELLBUTRIN SR) 200 MG 12 hr tablet  Class 1 obesity with serious comorbidity and body mass index (BMI) of 30.0 to 30.9 in adult, unspecified obesity type  PLAN: Diabetes II Darlene [name] has been given extensive diabetes education by myself today including ideal fasting and post-prandial blood glucose readings, individual ideal HgA1c goals  and hypoglycemia prevention. We discussed the importance of good blood sugar control to decrease the likelihood of diabetic complications such as nephropathy, neuropathy, limb loss, blindness, coronary artery disease, and death. We discussed the importance of intensive lifestyle modification including diet, exercise and weight loss as the first line treatment for diabetes. [name] agrees to continue to take Lantus 12 units daily #5 pens with no refills, Metformin 500 mg daily #30 with no refills, and Victoza 1.8 mg #3 pens with no refills and will follow up at the agreed upon time.  Depression with Emotional Eating Behaviors We discussed behavior modification techniques today to help Klaira deal with her emotional eating and depression. She has agreed to take Wellbutrin SR 200 mg qd #30 with no refills and  agreed to follow up as directed.  I spent > than 50% of the 25 minute visit on counseling as documented in the note.   Obesity Darlene [name] is currently in the action stage of change. As such, her goal is to continue with weight loss efforts She has agreed to keep a food  journal with 1300-1500 calories and 80+g of protein daily.  Darlene [name] has been instructed to work up to a goal of 150 minutes of combined cardio and strengthening exercise per week for weight loss and overall health benefits. We discussed the following Behavioral Modification Stratagies today: decrease eating out and no skipping meals.    Darlene [name] has agreed to follow up with our clinic in 2 weeks. She was informed of the importance of frequent follow up visits to maximize her success with intensive lifestyle modifications for her multiple health conditions.  ALLERGIES: Allergies  Allergen Reactions  . Nsaids Other (See Comments)    H/o gastric bypass - avoid NSAIDs  . Tape Hives, Rash and Other (See Comments)    Adhesives in bandaids    MEDICATIONS: Current Outpatient Medications on File Prior to Visit  Medication Sig Dispense Refill  . acetaminophen (TYLENOL) 325 MG tablet Take 650 mg by mouth 2 (two) times daily as needed for moderate pain or headache.     [name] CALCIUM CITRATE-VITAMIN D PO Take 1 tablet by mouth 3 (three) times daily.     . cholecalciferol (VITAMIN D) 1000 units tablet Take 1,000 Units by mouth daily.    . Cyanocobalamin (VITAMIN B-12 PO) Take 1 tablet by mouth every 14 (fourteen) days.    . diphenhydrAMINE (BENADRYL) 25 MG tablet Take 25 mg by mouth daily as needed for allergies or sleep.     . Insulin Pen Needle (BD PEN NEEDLE NANO U/F) 32G X 4 MM MISC 1 Package by Does not apply route daily at 12 noon. 100 each 0  . loratadine (CLARITIN) 10 MG tablet Take 10 mg by mouth as needed for allergies.    [name] losartan (COZAAR) 100 MG tablet Take 1 tablet (100 mg total) by mouth daily. 30 tablet 0  . Melatonin 3 MG CAPS Take 3 mg by mouth at bedtime.     Glory Rosebush DELICA LANCETS 49I MISC 1 Package by Does not apply route daily. 100 each 0  . Pediatric Multivitamins-Iron (FLINTSTONES PLUS IRON) chewable tablet Chew 1 tablet by mouth 2 (two) times daily.    . Probiotic Product  (CVS PROBIOTIC PO) Take 1 capsule by mouth daily.     . prochlorperazine (COMPAZINE) 10 MG tablet Take 1 tablet (10 mg total) by mouth every 6 (six) hours as needed for nausea or vomiting. 30 tablet 0  . Propylene Glycol (SYSTANE BALANCE OP) Place 1-2 drops into both eyes 3 (three) times daily as needed (for dry eyes).     [name] senna-docusate (SENNA PLUS) 8.6-50 MG tablet Take 1 tablet by mouth daily as needed for moderate constipation.     . topiramate (TOPAMAX) 25 MG tablet Take 1 tablet (25 mg total) by mouth daily. 30 tablet 0  . vortioxetine HBr (TRINTELLIX) 20 MG TABS tablet Take 1 tablet (20 mg total) by mouth at bedtime. 30 tablet 0   No current facility-administered medications on file prior to visit.     PAST MEDICAL HISTORY: Past Medical History:  Diagnosis Date  . Anemia    hx of - during 1st round chemo  . Anxiety   . Arthritis    in  fingers, shoulders  . Back pain   . Balance problem   . Breast cancer ([place])   . Breast cancer of upper-outer quadrant of right female breast ([place]) 07/22/2014  . Bunion, right foot   . Clumsiness   . Constipation   . Depression   . Diabetes mellitus without complication ([place])    02+ years  . Eating disorder    binge eating  . Epistaxis 08/26/2014  . Fatty liver   . HCAP (healthcare-associated pneumonia) 12/18/2014  . Headache   . Heart murmur    never had any problems  . History of radiation therapy 04/05/15-05/19/15   right chest wall was treated to 50.4 Gy in 28 fractions, right mastectomy scar was treated to 10 Gy in 5 fractions  . Hyperlipidemia   . Hypertension   . Joint pain   . Multiple food allergies   . Neuromuscular disorder ([place])    diabetic neuropathy  . Obesity   . Obstructive sleep apnea on CPAP    uses cpap nightly  . Ovarian cyst rupture    possible  . Pneumonia   . Pneumonia 11/2014  . Radiation 04/05/15-05/19/15   right chest wall 50.4 Gy, mastectomy scar 10 Gy  . Sleep apnea   . Thyroid nodule 06/2014    PAST  SURGICAL HISTORY: Past Surgical History:  Procedure Laterality Date  . APPENDECTOMY    . BIOPSY THYROID Bilateral 06/2014   2 nodules - Benign   . BREAST LUMPECTOMY WITH RADIOACTIVE SEED AND SENTINEL LYMPH NODE BIOPSY Right 12/27/2014   Procedure: BREAST LUMPECTOMY WITH RADIOACTIVE SEED AND SENTINEL LYMPH NODE BIOPSY;  Surgeon: Stark Klein, MD;  Location: [facility];  Service: General;  Laterality: Right;  . BREAST REDUCTION SURGERY Bilateral 01/06/2015   Procedure: BILATERAL BREAST REDUCTION, RIGHT ONCOPLASTIC RECONSTRUCTION),LEFT BREAST REDUCTION FOR SYMMETRY;  Surgeon: [name], MD;  Location: [facility];  Service: Plastics;  Laterality: Bilateral;  . BREATH TEK H PYLORI N/A 09/15/2012   Procedure: BREATH TEK H PYLORI;  Surgeon: Pedro Earls, MD;  Location: [facility];  Service: General;  Laterality: N/A;  . BUNIONECTOMY Left 12/2013  . CARPAL TUNNEL RELEASE Right   . CARPAL TUNNEL RELEASE Left   . CATARACT EXTRACTION     6/19  . COLONOSCOPY    . DUPUYTREN CONTRACTURE RELEASE    . GASTRIC ROUX-EN-Y N/A 11/02/2012   Procedure: LAPAROSCOPIC ROUX-EN-Y GASTRIC;  Surgeon: Pedro Earls, MD;  Location: [facility];  Service: General;  Laterality: N/A;  . IR GENERIC HISTORICAL  03/18/2016   IR US GUIDE VASC ACCESS LEFT 03/18/2016 Markus Daft, MD [facility]  . IR GENERIC HISTORICAL  03/18/2016   IR FLUORO GUIDE CV LINE LEFT 03/18/2016 Markus Daft, MD [facility]  . LAPAROSCOPIC APPENDECTOMY N/A 07/22/2015   Procedure: APPENDECTOMY LAPAROSCOPIC;  Surgeon: Michael Boston, MD;  Location: [facility];  Service: General;  Laterality: N/A;  . MASTECTOMY Right 02/15/2015   modified  . MODIFIED MASTECTOMY Right 02/15/2015   Procedure: RIGHT MODIFIED RADICAL MASTECTOMY;  Surgeon: Stark Klein, MD;  Location: [facility];  Service: General;  Laterality: Right;  . PORT-A-CATH REMOVAL Left 10/10/2015   Procedure: PORT REMOVAL;  Surgeon: Stark Klein, MD;  Location: [facility];  Service: General;   Laterality: Left;  . PORTACATH PLACEMENT Left 08/02/2014   Procedure: INSERTION PORT-A-CATH;  Surgeon: Stark Klein, MD;  Location: [facility];  Service: General;  Laterality: Left;  . PORTACATH PLACEMENT N/A 11/05/2016   Procedure: INSERTION PORT-A-CATH;  Surgeon: Stark Klein, MD;  Location: [facility];  Service: General;  Laterality: N/A;  . ROTATOR CUFF REPAIR Right 2005  . SCAR REVISION Right 12/08/2015   Procedure: COMPLEX REPAIR RIGHT CHEST 10-15CM;  Surgeon: [name], MD;  Location: [facility];  Service: Plastics;  Laterality: Right;  . TOE SURGERY  1970s   bone spur  . TRIGGER FINGER RELEASE     x3    SOCIAL HISTORY: Social History   Tobacco Use  . Smoking status: Never Smoker  . Smokeless tobacco: Never Used  Substance Use Topics  . Alcohol use: Yes    Alcohol/week: 1.0 standard drinks    Types: 1 Standard drinks or equivalent per week    Comment: social  . Drug use: No    FAMILY HISTORY: Family History  Problem Relation Age of Onset  . CVA Mother   . Heart disease Mother   . Sudden death Mother   . Diabetes Father   . Heart failure Father   . Hypertension Father   . Kidney disease Father   . Obesity Father   . Hypertension Sister   . Diabetes Brother   . Breast cancer Paternal Grandmother 58  . Diabetes Paternal Uncle   . Ovarian cancer Neg Hx     ROS: Review of Systems  Cardiovascular: Negative for palpitations.  Neurological:       Negative for lightheadedness  Endo/Heme/Allergies:       Negative hypoglycemia  Psychiatric/Behavioral: Positive for depression. Negative for suicidal ideas. The patient does not have insomnia.     PHYSICAL EXAM: Pt in no acute distress  RECENT LABS AND TESTS: BMET    Component Value Date/Time   NA 141 06/23/2018 0826   NA 143 03/11/2017 0841   NA 140 01/07/2017 0758   K 4.5 06/23/2018 0826   K 4.1 01/07/2017 0758   CL 113 (H) 06/23/2018 0826   CO2 21 (L) 06/23/2018 0826    CO2 24 01/07/2017 0758   GLUCOSE 246 (H) 06/23/2018 0826   GLUCOSE 134 01/07/2017 0758   BUN 28 (H) 06/23/2018 0826   BUN 40 (H) 03/11/2017 0841   BUN 33.6 (H) 01/07/2017 0758   CREATININE 1.64 (H) 06/23/2018 0826   CREATININE 1.62 (H) 04/28/2018 0915   CREATININE 1.5 (H) 01/07/2017 0758   CALCIUM 8.7 (L) 06/23/2018 0826   CALCIUM 9.3 01/07/2017 0758   GFRNONAA 32 (L) 06/23/2018 0826   GFRNONAA 33 (L) 04/28/2018 0915   GFRNONAA 55 (L) 02/18/2013 0827   GFRAA 38 (L) 06/23/2018 0826   GFRAA 38 (L) 04/28/2018 0915   GFRAA 63 02/18/2013 0827   Lab Results  Component Value Date   HGBA1C 5.8 (H) 09/03/2017   HGBA1C 6.1 (H) 03/11/2017   HGBA1C 8.2 (H) 11/05/2016   HGBA1C 8.1 (H) 08/09/2016   HGBA1C 6.5 (H) 01/03/2015   Lab Results  Component Value Date   INSULIN 15.3 03/11/2017   INSULIN 18.3 08/09/2016   CBC    Component Value Date/Time   WBC 3.6 (L) 06/23/2018 0826   RBC 3.14 (L) 06/23/2018 0826   HGB 9.9 (L) 06/23/2018 0826   HGB 10.6 (L) 12/16/2017 0806   HGB 10.8 (L) 01/07/2017 0758   HCT 31.4 (L) 06/23/2018 0826   HCT 31.9 (L) 01/07/2017 0758   PLT 130 (L) 06/23/2018 0826   PLT 83 (L) 12/16/2017 0806   PLT 126 (L) 01/07/2017 0758   MCV 100.0 06/23/2018 0826   MCV 90.8 01/07/2017 0758   MCH  31.5 06/23/2018 0826   MCHC 31.5 06/23/2018 0826   RDW 15.6 (H) 06/23/2018 0826   RDW 14.8 (H) 01/07/2017 0758   LYMPHSABS 0.7 06/23/2018 0826   LYMPHSABS 1.1 01/07/2017 0758   MONOABS 0.3 06/23/2018 0826   MONOABS 0.3 01/07/2017 0758   EOSABS 0.1 06/23/2018 0826   EOSABS 0.1 01/07/2017 0758   EOSABS 0.1 08/09/2016 1229   BASOSABS 0.0 06/23/2018 0826   BASOSABS 0.0 01/07/2017 0758   Iron/TIBC/Ferritin/ %Sat    Component Value Date/Time   IRON 58 02/18/2013 0827   TIBC 305 02/18/2013 0827   FERRITIN 133 10/31/2014 0816   IRONPCTSAT 19 (L) 02/18/2013 0827   Lipid Panel     Component Value Date/Time   CHOL 160 09/03/2017 1349   TRIG 111 09/03/2017 1349   HDL 59  09/03/2017 1349   CHOLHDL 2.9 02/18/2013 0827   VLDL 23 02/18/2013 0827   LDLCALC 79 09/03/2017 1349   Hepatic Function Panel     Component Value Date/Time   PROT 6.5 06/23/2018 0826   PROT 6.9 03/11/2017 0841   PROT 6.7 01/07/2017 0758   ALBUMIN 3.8 06/23/2018 0826   ALBUMIN 4.5 03/11/2017 0841   ALBUMIN 3.6 01/07/2017 0758   AST 46 (H) 06/23/2018 0826   AST 43 (H) 04/28/2018 0915   AST 51 (H) 01/07/2017 0758   ALT 66 (H) 06/23/2018 0826   ALT 61 (H) 04/28/2018 0915   ALT 82 (H) 01/07/2017 0758   ALKPHOS 116 06/23/2018 0826   ALKPHOS 115 01/07/2017 0758   BILITOT 0.6 06/23/2018 0826   BILITOT 0.6 04/28/2018 0915   BILITOT 0.72 01/07/2017 0758      Component Value Date/Time   TSH 1.54 02/07/2017   TSH 2.170 08/09/2016 1229      I, Renee Ramus, am acting as Location manager for Dennard Nip, MD  I have reviewed the above documentation for accuracy and completeness, and I agree with the above. -Dennard Nip, MD

## 2018-06-23 NOTE — Patient Instructions (Signed)
[facility] Discharge Instructions for Patients Receiving Chemotherapy  Today you received the following chemotherapy agents Paclitaxel-Protein ([place]) & Trastuzumab-ann [name]).  To help prevent nausea and vomiting after your treatment, we encourage you to take your nausea medication as prescribed.   If you develop nausea and vomiting that is not controlled by your nausea medication, call the clinic.   BELOW ARE SYMPTOMS THAT SHOULD BE REPORTED IMMEDIATELY:  *FEVER GREATER THAN 100.5 F  *CHILLS WITH OR WITHOUT FEVER  NAUSEA AND VOMITING THAT IS NOT CONTROLLED WITH YOUR NAUSEA MEDICATION  *UNUSUAL SHORTNESS OF BREATH  *UNUSUAL BRUISING OR BLEEDING  TENDERNESS IN MOUTH AND THROAT WITH OR WITHOUT PRESENCE OF ULCERS  *URINARY PROBLEMS  *BOWEL PROBLEMS  UNUSUAL RASH Items with * indicate a potential emergency and should be followed up as soon as possible.  Feel free to call the clinic should you have any questions or concerns. The clinic phone number is [phone].  Please show the [facility] at check-in to the Emergency Department and triage nurse.  Coronavirus (COVID-19) Are you at risk?  Are you at risk for the Coronavirus (COVID-19)?  To be considered HIGH RISK for Coronavirus (COVID-19), you have to meet the following criteria:  . Traveled to [place], [place], [place], [place] or [place]; or in the [place] to [place], [place], [place], or [place]; and have fever, cough, and shortness of breath within the last 2 weeks of travel OR . Been in close contact with a person diagnosed with COVID-19 within the last 2 weeks and have fever, cough, and shortness of breath . IF YOU DO NOT MEET THESE CRITERIA, YOU ARE CONSIDERED LOW RISK FOR COVID-19.  What to do if you are HIGH RISK for COVID-19?  [name] If you are having a medical emergency, call 911. . Seek medical care right away. Before you go to a doctor's office, urgent care or emergency  department, call ahead and tell them about your recent travel, contact with someone diagnosed with COVID-19, and your symptoms. You should receive instructions from your physician's office regarding next steps of care.  . When you arrive at healthcare provider, tell the healthcare staff immediately you have returned from visiting [place], [place], [place], [place] or [place]; or traveled in the [place] to [place], [place], [place], or [place]; in the last two weeks or you have been in close contact with a person diagnosed with COVID-19 in the last 2 weeks.   . Tell the health care staff about your symptoms: fever, cough and shortness of breath. . After you have been seen by a medical provider, you will be either: o Tested for (COVID-19) and discharged home on quarantine except to seek medical care if symptoms worsen, and asked to  - Stay home and avoid contact with others until you get your results (4-5 days)  - Avoid travel on public transportation if possible (such as bus, train, or airplane) or o Sent to the Emergency Department by EMS for evaluation, COVID-19 testing, and possible admission depending on your condition and test results.  What to do if you are LOW RISK for COVID-19?  Reduce your risk of any infection by using the same precautions used for avoiding the common cold or flu:  [name] Wash your hands often with soap and warm water for at least 20 seconds.  If soap and water are not readily available, use an alcohol-based hand sanitizer with at least 60% alcohol.  [name]  If coughing or sneezing, cover your mouth and nose by coughing or sneezing into the elbow areas of your shirt or coat, into a tissue or into your sleeve (not your hands). . Avoid shaking hands with others and consider head nods or verbal greetings only. . Avoid touching your eyes, nose, or mouth with unwashed hands.  . Avoid close contact with people who are sick. . Avoid places or events with large numbers of people  in one location, like concerts or sporting events. . Carefully consider travel plans you have or are making. . If you are planning any travel outside or inside the [name], visit the CDC's Travelers' Health webpage for the latest health notices. . If you have some symptoms but not all symptoms, continue to monitor at home and seek medical attention if your symptoms worsen. . If you are having a medical emergency, call 911.   [place] / e-Visit: [email or web address]         [facility]: [facility]: [phone]                   [facility]: [phone]

## 2018-06-24 ENCOUNTER — Telehealth: Payer: Self-pay | Admitting: Oncology

## 2018-06-24 NOTE — Telephone Encounter (Signed)
Talk with patient regarding schedule °

## 2018-06-28 DIAGNOSIS — E11319 Type 2 diabetes mellitus with unspecified diabetic retinopathy without macular edema: Secondary | ICD-10-CM | POA: Insufficient documentation

## 2018-07-04 ENCOUNTER — Encounter (INDEPENDENT_AMBULATORY_CARE_PROVIDER_SITE_OTHER): Payer: Self-pay | Admitting: Family Medicine

## 2018-07-04 ENCOUNTER — Other Ambulatory Visit (INDEPENDENT_AMBULATORY_CARE_PROVIDER_SITE_OTHER): Payer: Self-pay | Admitting: Family Medicine

## 2018-07-04 DIAGNOSIS — F3289 Other specified depressive episodes: Secondary | ICD-10-CM

## 2018-07-04 DIAGNOSIS — I10 Essential (primary) hypertension: Secondary | ICD-10-CM

## 2018-07-06 ENCOUNTER — Other Ambulatory Visit (INDEPENDENT_AMBULATORY_CARE_PROVIDER_SITE_OTHER): Payer: Self-pay | Admitting: Family Medicine

## 2018-07-06 DIAGNOSIS — F3289 Other specified depressive episodes: Secondary | ICD-10-CM

## 2018-07-06 DIAGNOSIS — I10 Essential (primary) hypertension: Secondary | ICD-10-CM

## 2018-07-07 ENCOUNTER — Encounter (INDEPENDENT_AMBULATORY_CARE_PROVIDER_SITE_OTHER): Payer: Self-pay | Admitting: Family Medicine

## 2018-07-07 ENCOUNTER — Inpatient Hospital Stay: Payer: Medicare Other

## 2018-07-07 ENCOUNTER — Ambulatory Visit (INDEPENDENT_AMBULATORY_CARE_PROVIDER_SITE_OTHER): Payer: Medicare Other | Admitting: Family Medicine

## 2018-07-07 ENCOUNTER — Other Ambulatory Visit: Payer: Self-pay

## 2018-07-07 VITALS — BP 131/65 | HR 57 | Temp 97.8°F | Resp 18

## 2018-07-07 DIAGNOSIS — C50411 Malignant neoplasm of upper-outer quadrant of right female breast: Secondary | ICD-10-CM

## 2018-07-07 DIAGNOSIS — E669 Obesity, unspecified: Secondary | ICD-10-CM

## 2018-07-07 DIAGNOSIS — Z171 Estrogen receptor negative status [ER-]: Secondary | ICD-10-CM

## 2018-07-07 DIAGNOSIS — Z95828 Presence of other vascular implants and grafts: Secondary | ICD-10-CM

## 2018-07-07 DIAGNOSIS — I10 Essential (primary) hypertension: Secondary | ICD-10-CM | POA: Diagnosis not present

## 2018-07-07 DIAGNOSIS — C50911 Malignant neoplasm of unspecified site of right female breast: Secondary | ICD-10-CM

## 2018-07-07 DIAGNOSIS — F3289 Other specified depressive episodes: Secondary | ICD-10-CM

## 2018-07-07 DIAGNOSIS — Z5112 Encounter for antineoplastic immunotherapy: Secondary | ICD-10-CM | POA: Diagnosis not present

## 2018-07-07 DIAGNOSIS — Z6830 Body mass index (BMI) 30.0-30.9, adult: Secondary | ICD-10-CM

## 2018-07-07 DIAGNOSIS — C787 Secondary malignant neoplasm of liver and intrahepatic bile duct: Secondary | ICD-10-CM

## 2018-07-07 DIAGNOSIS — C801 Malignant (primary) neoplasm, unspecified: Secondary | ICD-10-CM

## 2018-07-07 LAB — CBC WITH DIFFERENTIAL/PLATELET
Abs Immature Granulocytes: 0.02 10*3/uL (ref 0.00–0.07)
Basophils Absolute: 0 10*3/uL (ref 0.0–0.1)
Basophils Relative: 0 %
Eosinophils Absolute: 0.1 10*3/uL (ref 0.0–0.5)
Eosinophils Relative: 3 %
HCT: 30.5 % — ABNORMAL LOW (ref 36.0–46.0)
Hemoglobin: 10 g/dL — ABNORMAL LOW (ref 12.0–15.0)
Immature Granulocytes: 1 %
Lymphocytes Relative: 23 %
Lymphs Abs: 0.8 10*3/uL (ref 0.7–4.0)
MCH: 32.1 pg (ref 26.0–34.0)
MCHC: 32.8 g/dL (ref 30.0–36.0)
MCV: 97.8 fL (ref 80.0–100.0)
Monocytes Absolute: 0.4 10*3/uL (ref 0.1–1.0)
Monocytes Relative: 11 %
Neutro Abs: 2.2 10*3/uL (ref 1.7–7.7)
Neutrophils Relative %: 62 %
Platelets: 142 10*3/uL — ABNORMAL LOW (ref 150–400)
RBC: 3.12 MIL/uL — ABNORMAL LOW (ref 3.87–5.11)
RDW: 14.8 % (ref 11.5–15.5)
WBC: 3.6 10*3/uL — ABNORMAL LOW (ref 4.0–10.5)
nRBC: 0 % (ref 0.0–0.2)

## 2018-07-07 LAB — COMPREHENSIVE METABOLIC PANEL
ALT: 75 U/L — ABNORMAL HIGH (ref 0–44)
AST: 56 U/L — ABNORMAL HIGH (ref 15–41)
Albumin: 3.9 g/dL (ref 3.5–5.0)
Alkaline Phosphatase: 111 U/L (ref 38–126)
Anion gap: 8 (ref 5–15)
BUN: 37 mg/dL — ABNORMAL HIGH (ref 8–23)
CO2: 19 mmol/L — ABNORMAL LOW (ref 22–32)
Calcium: 8.8 mg/dL — ABNORMAL LOW (ref 8.9–10.3)
Chloride: 114 mmol/L — ABNORMAL HIGH (ref 98–111)
Creatinine, Ser: 1.9 mg/dL — ABNORMAL HIGH (ref 0.44–1.00)
GFR calc Af Amer: 32 mL/min — ABNORMAL LOW (ref >60)
GFR calc non Af Amer: 27 mL/min — ABNORMAL LOW (ref >60)
Glucose, Bld: 151 mg/dL — ABNORMAL HIGH (ref 70–99)
Potassium: 4.3 mmol/L (ref 3.5–5.1)
Sodium: 141 mmol/L (ref 135–145)
Total Bilirubin: 0.6 mg/dL (ref 0.3–1.2)
Total Protein: 6.5 g/dL (ref 6.5–8.1)

## 2018-07-07 LAB — CEA (IN HOUSE-CHCC): CEA (CHCC-In House): 1.14 ng/mL (ref 0.00–5.00)

## 2018-07-07 MED ORDER — PACLITAXEL PROTEIN-BOUND CHEMO INJECTION 100 MG
80.0000 mg/m2 | Freq: Once | INTRAVENOUS | Status: AC
  Administered 2018-07-07: 175 mg via INTRAVENOUS
  Filled 2018-07-07: qty 35

## 2018-07-07 MED ORDER — PROCHLORPERAZINE MALEATE 10 MG PO TABS
ORAL_TABLET | ORAL | Status: AC
  Filled 2018-07-07: qty 1

## 2018-07-07 MED ORDER — TRASTUZUMAB-ANNS CHEMO 420 MG IV SOLR
4.0000 mg/kg | Freq: Once | INTRAVENOUS | Status: AC
  Administered 2018-07-07: 357 mg via INTRAVENOUS
  Filled 2018-07-07: qty 17

## 2018-07-07 MED ORDER — SODIUM CHLORIDE 0.9% FLUSH
10.0000 mL | INTRAVENOUS | Status: DC | PRN
  Administered 2018-07-07: 10 mL
  Filled 2018-07-07: qty 10

## 2018-07-07 MED ORDER — TOPIRAMATE 25 MG PO TABS: 25.0000 mg | ORAL_TABLET | Freq: Every day | ORAL | 0 refills | Status: DC

## 2018-07-07 MED ORDER — SODIUM CHLORIDE 0.9 % IV SOLN
Freq: Once | INTRAVENOUS | Status: AC
  Administered 2018-07-07: 09:00:00 via INTRAVENOUS
  Filled 2018-07-07: qty 250

## 2018-07-07 MED ORDER — HEPARIN SOD (PORK) LOCK FLUSH 100 UNIT/ML IV SOLN
500.0000 [IU] | Freq: Once | INTRAVENOUS | Status: AC | PRN
  Administered 2018-07-07: 500 [IU]
  Filled 2018-07-07: qty 5

## 2018-07-07 MED ORDER — LOSARTAN POTASSIUM 100 MG PO TABS: 100.0000 mg | ORAL_TABLET | Freq: Every day | ORAL | 0 refills | Status: DC

## 2018-07-07 MED ORDER — ACETAMINOPHEN 325 MG PO TABS
ORAL_TABLET | ORAL | Status: AC
  Filled 2018-07-07: qty 2

## 2018-07-07 MED ORDER — PROCHLORPERAZINE MALEATE 10 MG PO TABS
10.0000 mg | ORAL_TABLET | Freq: Once | ORAL | Status: AC
  Administered 2018-07-07: 10 mg via ORAL

## 2018-07-07 MED ORDER — VORTIOXETINE HBR 20 MG PO TABS: 20.0000 mg | ORAL_TABLET | Freq: Every day | ORAL | 0 refills | Status: DC

## 2018-07-07 MED ORDER — DIPHENHYDRAMINE HCL 25 MG PO CAPS
ORAL_CAPSULE | ORAL | Status: AC
  Filled 2018-07-07: qty 1

## 2018-07-07 MED ORDER — SODIUM CHLORIDE 0.9% FLUSH
10.0000 mL | INTRAVENOUS | Status: DC | PRN
  Administered 2018-07-07: 10 mL via INTRAVENOUS
  Filled 2018-07-07: qty 10

## 2018-07-07 MED ORDER — ACETAMINOPHEN 325 MG PO TABS
650.0000 mg | ORAL_TABLET | Freq: Once | ORAL | Status: AC
  Administered 2018-07-07: 650 mg via ORAL

## 2018-07-07 MED ORDER — DIPHENHYDRAMINE HCL 25 MG PO CAPS
25.0000 mg | ORAL_CAPSULE | Freq: Once | ORAL | Status: AC
  Administered 2018-07-07: 25 mg via ORAL

## 2018-07-07 NOTE — Patient Instructions (Signed)
Darlene [name] Discharge Instructions for Patients Receiving Chemotherapy  Today you received the following chemotherapy agents Paclitaxel-Protein ([place]) & Trastuzumab-ann [name]).  To help prevent nausea and vomiting after your treatment, we encourage you to take your nausea medication as prescribed.   If you develop nausea and vomiting that is not controlled by your nausea medication, call the clinic.   BELOW ARE SYMPTOMS THAT SHOULD BE REPORTED IMMEDIATELY:  *FEVER GREATER THAN 100.5 F  *CHILLS WITH OR WITHOUT FEVER  NAUSEA AND VOMITING THAT IS NOT CONTROLLED WITH YOUR NAUSEA MEDICATION  *UNUSUAL SHORTNESS OF BREATH  *UNUSUAL BRUISING OR BLEEDING  TENDERNESS IN MOUTH AND THROAT WITH OR WITHOUT PRESENCE OF ULCERS  *URINARY PROBLEMS  *BOWEL PROBLEMS  UNUSUAL RASH Items with * indicate a potential emergency and should be followed up as soon as possible.  Feel free to call the clinic should you have any questions or concerns. The clinic phone number is [phone].  Please show the [facility] at check-in to the Emergency Department and triage nurse.  Coronavirus (COVID-19) Are you at risk?  Are you at risk for the Coronavirus (COVID-19)?  To be considered HIGH RISK for Coronavirus (COVID-19), you have to meet the following criteria:  . Traveled to [place], [place], [place], [place] or [place]; or in the [place] to [place], [place], [place], or [place]; and have fever, cough, and shortness of breath within the last 2 weeks of travel OR . Been in close contact with a person diagnosed with COVID-19 within the last 2 weeks and have fever, cough, and shortness of breath . IF YOU DO NOT MEET THESE CRITERIA, YOU ARE CONSIDERED LOW RISK FOR COVID-19.  What to do if you are HIGH RISK for COVID-19?  [name] If you are having a medical emergency, call 911. . Seek medical care right away. Before you go to a doctor's office, urgent care or emergency  department, call ahead and tell them about your recent travel, contact with someone diagnosed with COVID-19, and your symptoms. You should receive instructions from your physician's office regarding next steps of care.  . When you arrive at healthcare provider, tell the healthcare staff immediately you have returned from visiting [place], [place], [place], [place] or [place]; or traveled in the [place] to [place], [place], [place], or [place]; in the last two weeks or you have been in close contact with a person diagnosed with COVID-19 in the last 2 weeks.   . Tell the health care staff about your symptoms: fever, cough and shortness of breath. . After you have been seen by a medical provider, you will be either: o Tested for (COVID-19) and discharged home on quarantine except to seek medical care if symptoms worsen, and asked to  - Stay home and avoid contact with others until you get your results (4-5 days)  - Avoid travel on public transportation if possible (such as bus, train, or airplane) or o Sent to the Emergency Department by EMS for evaluation, COVID-19 testing, and possible admission depending on your condition and test results.  What to do if you are LOW RISK for COVID-19?  Reduce your risk of any infection by using the same precautions used for avoiding the common cold or flu:  [name] Wash your hands often with soap and warm water for at least 20 seconds.  If soap and water are not readily available, use an alcohol-based hand sanitizer with at least 60% alcohol.  [name]  If coughing or sneezing, cover your mouth and nose by coughing or sneezing into the elbow areas of your shirt or coat, into a tissue or into your sleeve (not your hands). . Avoid shaking hands with others and consider head nods or verbal greetings only. . Avoid touching your eyes, nose, or mouth with unwashed hands.  . Avoid close contact with people who are sick. . Avoid places or events with large numbers of people  in one location, like concerts or sporting events. . Carefully consider travel plans you have or are making. . If you are planning any travel outside or inside the [name], visit the CDC's Travelers' Health webpage for the latest health notices. . If you have some symptoms but not all symptoms, continue to monitor at home and seek medical attention if your symptoms worsen. . If you are having a medical emergency, call 911.   [place] / e-Visit: [email or web address]         [facility]: [facility]: [phone]                   [facility]: [phone]

## 2018-07-07 NOTE — Progress Notes (Signed)
Per Dr. Jana Hakim ok to treat today with creatinine of 1.90.

## 2018-07-08 NOTE — Progress Notes (Signed)
Office: [phone]  /  Fax: [phone] TeleHealth Visit:  Darlene [name] has verbally consented to this TeleHealth visit today. The patient is located at home, the provider is located at the [facility] office. The participants in this visit include the listed provider and patient. The visit was conducted today via Facetime.  HPI:   Chief Complaint: OBESITY Darlene [name] is here to discuss her progress with her obesity treatment plan. She is on the  keep a food journal with 1300-1500 calories and 80+g of protein daily and is following her eating plan approximately 60-70 % of the time. [place]  Jaley continues to do well maintaining her weight. She had retina surgery yesterday and cannot exercise for a couple of weeks. She recently moved and is still trying to unpack.  We were unable to weigh the patient today for this TeleHealth visit. She feels as if she has maintained weight since her last visit. She has lost 30 lbs since starting treatment with [name].  Hypertension Darlene [name] is a 68 y.o. female with hypertension.  Darlene [name] denies chest pain or shortness of breath on exertion. She is working weight loss to help control her blood pressure with the goal of decreasing her risk of heart attack and stroke. Darlene [name] does not check blood pressure at home, her blood pressure has been borderline at other offices.   Depression with emotional eating behaviors Darlene [name] is struggling with emotional eating and using food for comfort to the extent that it is negatively impacting her health. She often snacks when she is not hungry. Darlene [name] sometimes feels she is out of control and then feels guilty that she made poor food choices. She has been working on behavior modification techniques to help reduce her emotional eating and has been somewhat successful. She shows no sign of suicidal or homicidal ideations. Her mood is  stable on medications, she notes better sleep and improved energy.   No flowsheet data found.     ASSESSMENT AND PLAN:  Essential hypertension - Plan: losartan (COZAAR) 100 MG tablet  Other depression - with emotional eating - Plan: topiramate (TOPAMAX) 25 MG tablet, vortioxetine HBr (TRINTELLIX) 20 MG TABS tablet  Class 1 obesity with serious comorbidity and body mass index (BMI) of 30.0 to 30.9 in adult, unspecified obesity type - Starting BMI greater then 30  PLAN: Hypertension We discussed sodium restriction, working on healthy weight loss, and a regular exercise program as the means to achieve improved blood pressure control. Laketa agreed with this plan and agreed to follow up as directed. We will continue to monitor her blood pressure as well as her progress with the above lifestyle modifications. She will continue her losartan 100 mg daily #30 with no refills and will watch for signs of hypotension as she continues her lifestyle modifications.  Depression with Emotional Eating Behaviors We discussed behavior modification techniques today to help Darlene [name] deal with her emotional eating and depression. She has agreed to take trintellix 20 mg daily #30 with no refills and topamax 25 mg daily #30 with no refills and continue Wellbutrin as prescribed. She agreed to follow up as directed.  Obesity Darlene [name] is currently in the action stage of change. As such, her goal is to continue with weight loss efforts She has agreed to keep a food journal with 1300-1500 calories and 80+g of protein daily.  Darlene [name] has been instructed to work up to a goal of  150 minutes of combined cardio and strengthening exercise per week for weight loss and overall health benefits. We discussed the following Behavioral Modification Stratagies today: increasing lean protein intake, decreasing simple carbohydrates  and work on meal planning and easy cooking plans   Little has agreed to follow up with our  clinic in 2 weeks. She was informed of the importance of frequent follow up visits to maximize her success with intensive lifestyle modifications for her multiple health conditions.  ALLERGIES: Allergies  Allergen Reactions  . Nsaids Other (See Comments)    H/o gastric bypass - avoid NSAIDs  . Tape Hives, Rash and Other (See Comments)    Adhesives in bandaids    MEDICATIONS: Current Outpatient Medications on File Prior to Visit  Medication Sig Dispense Refill  . acetaminophen (TYLENOL) 325 MG tablet Take 650 mg by mouth 2 (two) times daily as needed for moderate pain or headache.     [name] buPROPion (WELLBUTRIN SR) 200 MG 12 hr tablet TAKE 1 TABLET BY MOUTH DAILY AT 12 NOON. 30 tablet 0  . CALCIUM CITRATE-VITAMIN D PO Take 1 tablet by mouth 3 (three) times daily.     . cholecalciferol (VITAMIN D) 1000 units tablet Take 1,000 Units by mouth daily.    . Cyanocobalamin (VITAMIN B-12 PO) Take 1 tablet by mouth every 14 (fourteen) days.    . diphenhydrAMINE (BENADRYL) 25 MG tablet Take 25 mg by mouth daily as needed for allergies or sleep.     . Insulin Glargine (LANTUS SOLOSTAR) 100 UNIT/ML Solostar Pen Inject 12 Units into the skin daily. 5 pen 0  . Insulin Pen Needle (BD PEN NEEDLE NANO U/F) 32G X 4 MM MISC 1 Package by Does not apply route daily at 12 noon. 100 each 0  . liraglutide (VICTOZA) 18 MG/3ML SOPN INJECT 1.8MG (0.3 MLS) INTO THE SKIN EVERY MORNING 3 pen 0  . loratadine (CLARITIN) 10 MG tablet Take 10 mg by mouth as needed for allergies.    . Melatonin 3 MG CAPS Take 3 mg by mouth at bedtime.     . metFORMIN (GLUCOPHAGE) 500 MG tablet Take 1 tablet (500 mg total) by mouth daily. 30 tablet 0  . ONETOUCH DELICA LANCETS 65K MISC 1 Package by Does not apply route daily. 100 each 0  . Pediatric Multivitamins-Iron (FLINTSTONES PLUS IRON) chewable tablet Chew 1 tablet by mouth 2 (two) times daily.    . Probiotic Product ([employer] PROBIOTIC PO) Take 1 capsule by mouth daily.     . prochlorperazine  (COMPAZINE) 10 MG tablet Take 1 tablet (10 mg total) by mouth every 6 (six) hours as needed for nausea or vomiting. 30 tablet 0  . Propylene Glycol (SYSTANE BALANCE OP) Place 1-2 drops into both eyes 3 (three) times daily as needed (for dry eyes).     [name] senna-docusate (SENNA PLUS) 8.6-50 MG tablet Take 1 tablet by mouth daily as needed for moderate constipation.      No current facility-administered medications on file prior to visit.     PAST MEDICAL HISTORY: Past Medical History:  Diagnosis Date  . Anemia    hx of - during 1st round chemo  . Anxiety   . Arthritis    in fingers, shoulders  . Back pain   . Balance problem   . Breast cancer ([place])   . Breast cancer of upper-outer quadrant of right female breast ([place]) 07/22/2014  . Bunion, right foot   . Clumsiness   . Constipation   .  Depression   . Diabetes mellitus without complication ([place])    99+ years  . Eating disorder    binge eating  . Epistaxis 08/26/2014  . Fatty liver   . HCAP (healthcare-associated pneumonia) 12/18/2014  . Headache   . Heart murmur    never had any problems  . History of radiation therapy 04/05/15-05/19/15   right chest wall was treated to 50.4 Gy in 28 fractions, right mastectomy scar was treated to 10 Gy in 5 fractions  . Hyperlipidemia   . Hypertension   . Joint pain   . Multiple food allergies   . Neuromuscular disorder ([place])    diabetic neuropathy  . Obesity   . Obstructive sleep apnea on CPAP    uses cpap nightly  . Ovarian cyst rupture    possible  . Pneumonia   . Pneumonia 11/2014  . Radiation 04/05/15-05/19/15   right chest wall 50.4 Gy, mastectomy scar 10 Gy  . Sleep apnea   . Thyroid nodule 06/2014    PAST SURGICAL HISTORY: Past Surgical History:  Procedure Laterality Date  . APPENDECTOMY    . BIOPSY THYROID Bilateral 06/2014   2 nodules - Benign   . BREAST LUMPECTOMY WITH RADIOACTIVE SEED AND SENTINEL LYMPH NODE BIOPSY Right 12/27/2014   Procedure: BREAST LUMPECTOMY WITH  RADIOACTIVE SEED AND SENTINEL LYMPH NODE BIOPSY;  Surgeon: Stark Klein, MD;  Location: [place];  Service: General;  Laterality: Right;  . BREAST REDUCTION SURGERY Bilateral 01/06/2015   Procedure: BILATERAL BREAST REDUCTION, RIGHT ONCOPLASTIC RECONSTRUCTION),LEFT BREAST REDUCTION FOR SYMMETRY;  Surgeon: [name], MD;  Location: [place];  Service: Plastics;  Laterality: Bilateral;  . BREATH TEK H PYLORI N/A 09/15/2012   Procedure: BREATH TEK H PYLORI;  Surgeon: Pedro Earls, MD;  Location: [facility];  Service: General;  Laterality: N/A;  . BUNIONECTOMY Left 12/2013  . CARPAL TUNNEL RELEASE Right   . CARPAL TUNNEL RELEASE Left   . CATARACT EXTRACTION     6/19  . COLONOSCOPY    . DUPUYTREN CONTRACTURE RELEASE    . GASTRIC ROUX-EN-Y N/A 11/02/2012   Procedure: LAPAROSCOPIC ROUX-EN-Y GASTRIC;  Surgeon: Pedro Earls, MD;  Location: [facility];  Service: General;  Laterality: N/A;  . IR GENERIC HISTORICAL  03/18/2016   IR US GUIDE VASC ACCESS LEFT 03/18/2016 Markus Daft, MD [facility]  . IR GENERIC HISTORICAL  03/18/2016   IR FLUORO GUIDE CV LINE LEFT 03/18/2016 Markus Daft, MD [facility]  . LAPAROSCOPIC APPENDECTOMY N/A 07/22/2015   Procedure: APPENDECTOMY LAPAROSCOPIC;  Surgeon: Michael Boston, MD;  Location: [facility];  Service: General;  Laterality: N/A;  . MASTECTOMY Right 02/15/2015   modified  . MODIFIED MASTECTOMY Right 02/15/2015   Procedure: RIGHT MODIFIED RADICAL MASTECTOMY;  Surgeon: Stark Klein, MD;  Location: [place];  Service: General;  Laterality: Right;  . PORT-A-CATH REMOVAL Left 10/10/2015   Procedure: PORT REMOVAL;  Surgeon: Stark Klein, MD;  Location: [place];  Service: General;  Laterality: Left;  . PORTACATH PLACEMENT Left 08/02/2014   Procedure: INSERTION PORT-A-CATH;  Surgeon: Stark Klein, MD;  Location: [place];  Service: General;  Laterality: Left;  . PORTACATH PLACEMENT N/A 11/05/2016   Procedure: INSERTION PORT-A-CATH;  Surgeon:  Stark Klein, MD;  Location: [place];  Service: General;  Laterality: N/A;  . ROTATOR CUFF REPAIR Right 2005  . SCAR REVISION Right 12/08/2015   Procedure: COMPLEX REPAIR RIGHT CHEST 10-15CM;  Surgeon: [name], MD;  Location: [place];  Service: Clinical cytogeneticist;  Laterality: Right;  . TOE SURGERY  1970s   bone spur  . TRIGGER FINGER RELEASE     x3    SOCIAL HISTORY: Social History   Tobacco Use  . Smoking status: Never Smoker  . Smokeless tobacco: Never Used  Substance Use Topics  . Alcohol use: Yes    Alcohol/week: 1.0 standard drinks    Types: 1 Standard drinks or equivalent per week    Comment: social  . Drug use: No    FAMILY HISTORY: Family History  Problem Relation Age of Onset  . CVA Mother   . Heart disease Mother   . Sudden death Mother   . Diabetes Father   . Heart failure Father   . Hypertension Father   . Kidney disease Father   . Obesity Father   . Hypertension Sister   . Diabetes Brother   . Breast cancer Paternal Grandmother 80  . Diabetes Paternal Uncle   . Ovarian cancer Neg Hx     ROS: Review of Systems  Respiratory: Negative for shortness of breath.   Cardiovascular: Negative for chest pain.  Psychiatric/Behavioral: Positive for depression. Negative for suicidal ideas.    PHYSICAL EXAM: Pt in no acute distress  RECENT LABS AND TESTS: BMET    Component Value Date/Time   NA 141 07/07/2018 0826   NA 143 03/11/2017 0841   NA 140 01/07/2017 0758   K 4.3 07/07/2018 0826   K 4.1 01/07/2017 0758   CL 114 (H) 07/07/2018 0826   CO2 19 (L) 07/07/2018 0826   CO2 24 01/07/2017 0758   GLUCOSE 151 (H) 07/07/2018 0826   GLUCOSE 134 01/07/2017 0758   BUN 37 (H) 07/07/2018 0826   BUN 40 (H) 03/11/2017 0841   BUN 33.6 (H) 01/07/2017 0758   CREATININE 1.90 (H) 07/07/2018 0826   CREATININE 1.62 (H) 04/28/2018 0915   CREATININE 1.5 (H) 01/07/2017 0758   CALCIUM 8.8 (L) 07/07/2018 0826   CALCIUM 9.3 01/07/2017 0758   GFRNONAA 27  (L) 07/07/2018 0826   GFRNONAA 33 (L) 04/28/2018 0915   GFRNONAA 55 (L) 02/18/2013 0827   GFRAA 32 (L) 07/07/2018 0826   GFRAA 38 (L) 04/28/2018 0915   GFRAA 63 02/18/2013 0827   Lab Results  Component Value Date   HGBA1C 5.8 (H) 09/03/2017   HGBA1C 6.1 (H) 03/11/2017   HGBA1C 8.2 (H) 11/05/2016   HGBA1C 8.1 (H) 08/09/2016   HGBA1C 6.5 (H) 01/03/2015   Lab Results  Component Value Date   INSULIN 15.3 03/11/2017   INSULIN 18.3 08/09/2016   CBC    Component Value Date/Time   WBC 3.6 (L) 07/07/2018 0826   RBC 3.12 (L) 07/07/2018 0826   HGB 10.0 (L) 07/07/2018 0826   HGB 10.6 (L) 12/16/2017 0806   HGB 10.8 (L) 01/07/2017 0758   HCT 30.5 (L) 07/07/2018 0826   HCT 31.9 (L) 01/07/2017 0758   PLT 142 (L) 07/07/2018 0826   PLT 83 (L) 12/16/2017 0806   PLT 126 (L) 01/07/2017 0758   MCV 97.8 07/07/2018 0826   MCV 90.8 01/07/2017 0758   MCH 32.1 07/07/2018 0826   MCHC 32.8 07/07/2018 0826   RDW 14.8 07/07/2018 0826   RDW 14.8 (H) 01/07/2017 0758   LYMPHSABS 0.8 07/07/2018 0826   LYMPHSABS 1.1 01/07/2017 0758   MONOABS 0.4 07/07/2018 0826   MONOABS 0.3 01/07/2017 0758   EOSABS 0.1 07/07/2018 0826   EOSABS 0.1 01/07/2017 0758   EOSABS 0.1 08/09/2016 1229  BASOSABS 0.0 07/07/2018 0826   BASOSABS 0.0 01/07/2017 0758   Iron/TIBC/Ferritin/ %Sat    Component Value Date/Time   IRON 58 02/18/2013 0827   TIBC 305 02/18/2013 0827   FERRITIN 133 10/31/2014 0816   IRONPCTSAT 19 (L) 02/18/2013 0827   Lipid Panel     Component Value Date/Time   CHOL 160 09/03/2017 1349   TRIG 111 09/03/2017 1349   HDL 59 09/03/2017 1349   CHOLHDL 2.9 02/18/2013 0827   VLDL 23 02/18/2013 0827   LDLCALC 79 09/03/2017 1349   Hepatic Function Panel     Component Value Date/Time   PROT 6.5 07/07/2018 0826   PROT 6.9 03/11/2017 0841   PROT 6.7 01/07/2017 0758   ALBUMIN 3.9 07/07/2018 0826   ALBUMIN 4.5 03/11/2017 0841   ALBUMIN 3.6 01/07/2017 0758   AST 56 (H) 07/07/2018 0826   AST 43 (H)  04/28/2018 0915   AST 51 (H) 01/07/2017 0758   ALT 75 (H) 07/07/2018 0826   ALT 61 (H) 04/28/2018 0915   ALT 82 (H) 01/07/2017 0758   ALKPHOS 111 07/07/2018 0826   ALKPHOS 115 01/07/2017 0758   BILITOT 0.6 07/07/2018 0826   BILITOT 0.6 04/28/2018 0915   BILITOT 0.72 01/07/2017 0758      Component Value Date/Time   TSH 1.54 02/07/2017   TSH 2.170 08/09/2016 1229      I, Renee Ramus, am acting as Location manager for Dennard Nip, MD  I have reviewed the above documentation for accuracy and completeness, and I agree with the above. -Dennard Nip, MD

## 2018-07-19 DIAGNOSIS — E11311 Type 2 diabetes mellitus with unspecified diabetic retinopathy with macular edema: Secondary | ICD-10-CM | POA: Insufficient documentation

## 2018-07-21 ENCOUNTER — Encounter: Payer: Self-pay | Admitting: Certified Nurse Midwife

## 2018-07-21 ENCOUNTER — Ambulatory Visit (INDEPENDENT_AMBULATORY_CARE_PROVIDER_SITE_OTHER): Payer: Medicare Other | Admitting: Certified Nurse Midwife

## 2018-07-21 ENCOUNTER — Inpatient Hospital Stay: Payer: Medicare Other

## 2018-07-21 ENCOUNTER — Other Ambulatory Visit: Payer: Self-pay | Admitting: Oncology

## 2018-07-21 ENCOUNTER — Other Ambulatory Visit: Payer: Self-pay

## 2018-07-21 ENCOUNTER — Other Ambulatory Visit (HOSPITAL_COMMUNITY)
Admission: RE | Admit: 2018-07-21 | Discharge: 2018-07-21 | Disposition: A | Discharge status: Home / Self Care | Payer: Medicare Other | Source: Ambulatory Visit | Attending: Certified Nurse Midwife | Admitting: Certified Nurse Midwife

## 2018-07-21 VITALS — BP 125/70 | HR 61 | Temp 98.7°F | Resp 18

## 2018-07-21 VITALS — BP 110/64 | HR 68 | Temp 97.6°F | Resp 16 | Ht 68.5 in | Wt 194.0 lb

## 2018-07-21 DIAGNOSIS — Z124 Encounter for screening for malignant neoplasm of cervix: Secondary | ICD-10-CM | POA: Diagnosis present

## 2018-07-21 DIAGNOSIS — Z780 Asymptomatic menopausal state: Secondary | ICD-10-CM

## 2018-07-21 DIAGNOSIS — Z95828 Presence of other vascular implants and grafts: Secondary | ICD-10-CM

## 2018-07-21 DIAGNOSIS — Z5112 Encounter for antineoplastic immunotherapy: Secondary | ICD-10-CM | POA: Diagnosis not present

## 2018-07-21 DIAGNOSIS — C50411 Malignant neoplasm of upper-outer quadrant of right female breast: Secondary | ICD-10-CM

## 2018-07-21 DIAGNOSIS — C801 Malignant (primary) neoplasm, unspecified: Secondary | ICD-10-CM

## 2018-07-21 DIAGNOSIS — C787 Secondary malignant neoplasm of liver and intrahepatic bile duct: Secondary | ICD-10-CM

## 2018-07-21 DIAGNOSIS — C50911 Malignant neoplasm of unspecified site of right female breast: Secondary | ICD-10-CM

## 2018-07-21 DIAGNOSIS — Z171 Estrogen receptor negative status [ER-]: Secondary | ICD-10-CM

## 2018-07-21 DIAGNOSIS — Z853 Personal history of malignant neoplasm of breast: Secondary | ICD-10-CM | POA: Diagnosis not present

## 2018-07-21 DIAGNOSIS — Z01419 Encounter for gynecological examination (general) (routine) without abnormal findings: Secondary | ICD-10-CM

## 2018-07-21 LAB — COMPREHENSIVE METABOLIC PANEL
ALT: 104 U/L — ABNORMAL HIGH (ref 0–44)
AST: 72 U/L — ABNORMAL HIGH (ref 15–41)
Albumin: 4 g/dL (ref 3.5–5.0)
Alkaline Phosphatase: 106 U/L (ref 38–126)
Anion gap: 8 (ref 5–15)
BUN: 41 mg/dL — ABNORMAL HIGH (ref 8–23)
CO2: 21 mmol/L — ABNORMAL LOW (ref 22–32)
Calcium: 8.8 mg/dL — ABNORMAL LOW (ref 8.9–10.3)
Chloride: 112 mmol/L — ABNORMAL HIGH (ref 98–111)
Creatinine, Ser: 1.83 mg/dL — ABNORMAL HIGH (ref 0.44–1.00)
GFR calc Af Amer: 33 mL/min — ABNORMAL LOW (ref >60)
GFR calc non Af Amer: 28 mL/min — ABNORMAL LOW (ref >60)
Glucose, Bld: 116 mg/dL — ABNORMAL HIGH (ref 70–99)
Potassium: 4.4 mmol/L (ref 3.5–5.1)
Sodium: 141 mmol/L (ref 135–145)
Total Bilirubin: 0.6 mg/dL (ref 0.3–1.2)
Total Protein: 6.7 g/dL (ref 6.5–8.1)

## 2018-07-21 LAB — CBC WITH DIFFERENTIAL/PLATELET
Abs Immature Granulocytes: 0.01 10*3/uL (ref 0.00–0.07)
Basophils Absolute: 0 10*3/uL (ref 0.0–0.1)
Basophils Relative: 0 %
Eosinophils Absolute: 0.1 10*3/uL (ref 0.0–0.5)
Eosinophils Relative: 2 %
HCT: 28.6 % — ABNORMAL LOW (ref 36.0–46.0)
Hemoglobin: 9.4 g/dL — ABNORMAL LOW (ref 12.0–15.0)
Immature Granulocytes: 0 %
Lymphocytes Relative: 23 %
Lymphs Abs: 0.9 10*3/uL (ref 0.7–4.0)
MCH: 31.6 pg (ref 26.0–34.0)
MCHC: 32.9 g/dL (ref 30.0–36.0)
MCV: 96.3 fL (ref 80.0–100.0)
Monocytes Absolute: 0.4 10*3/uL (ref 0.1–1.0)
Monocytes Relative: 10 %
Neutro Abs: 2.5 10*3/uL (ref 1.7–7.7)
Neutrophils Relative %: 65 %
Platelets: 122 10*3/uL — ABNORMAL LOW (ref 150–400)
RBC: 2.97 MIL/uL — ABNORMAL LOW (ref 3.87–5.11)
RDW: 14.6 % (ref 11.5–15.5)
WBC: 3.9 10*3/uL — ABNORMAL LOW (ref 4.0–10.5)
nRBC: 0 % (ref 0.0–0.2)

## 2018-07-21 MED ORDER — PACLITAXEL PROTEIN-BOUND CHEMO INJECTION 100 MG
80.0000 mg/m2 | Freq: Once | INTRAVENOUS | Status: AC
  Administered 2018-07-21: 175 mg via INTRAVENOUS
  Filled 2018-07-21: qty 35

## 2018-07-21 MED ORDER — DIPHENHYDRAMINE HCL 25 MG PO CAPS
25.0000 mg | ORAL_CAPSULE | Freq: Once | ORAL | Status: AC
  Administered 2018-07-21: 25 mg via ORAL

## 2018-07-21 MED ORDER — ACETAMINOPHEN 325 MG PO TABS
650.0000 mg | ORAL_TABLET | Freq: Once | ORAL | Status: AC
  Administered 2018-07-21: 650 mg via ORAL

## 2018-07-21 MED ORDER — TRASTUZUMAB-ANNS CHEMO 420 MG IV SOLR
4.0000 mg/kg | Freq: Once | INTRAVENOUS | Status: AC
  Administered 2018-07-21: 357 mg via INTRAVENOUS
  Filled 2018-07-21: qty 17

## 2018-07-21 MED ORDER — PROCHLORPERAZINE MALEATE 10 MG PO TABS
ORAL_TABLET | ORAL | Status: AC
  Filled 2018-07-21: qty 1

## 2018-07-21 MED ORDER — PROCHLORPERAZINE MALEATE 10 MG PO TABS
10.0000 mg | ORAL_TABLET | Freq: Once | ORAL | Status: AC
  Administered 2018-07-21: 10 mg via ORAL

## 2018-07-21 MED ORDER — HEPARIN SOD (PORK) LOCK FLUSH 100 UNIT/ML IV SOLN
500.0000 [IU] | Freq: Once | INTRAVENOUS | Status: DC | PRN
  Filled 2018-07-21: qty 5

## 2018-07-21 MED ORDER — ACETAMINOPHEN 325 MG PO TABS
ORAL_TABLET | ORAL | Status: AC
  Filled 2018-07-21: qty 2

## 2018-07-21 MED ORDER — SODIUM CHLORIDE 0.9 % IV SOLN
Freq: Once | INTRAVENOUS | Status: DC
  Filled 2018-07-21: qty 250

## 2018-07-21 MED ORDER — HEPARIN SOD (PORK) LOCK FLUSH 100 UNIT/ML IV SOLN
500.0000 [IU] | Freq: Once | INTRAVENOUS | Status: AC | PRN
  Administered 2018-07-21: 500 [IU]
  Filled 2018-07-21: qty 5

## 2018-07-21 MED ORDER — SODIUM CHLORIDE 0.9% FLUSH
10.0000 mL | INTRAVENOUS | Status: DC | PRN
  Filled 2018-07-21: qty 10

## 2018-07-21 MED ORDER — SODIUM CHLORIDE 0.9% FLUSH
10.0000 mL | INTRAVENOUS | Status: DC | PRN
  Administered 2018-07-21: 10 mL
  Filled 2018-07-21: qty 10

## 2018-07-21 MED ORDER — SODIUM CHLORIDE 0.9 % IV SOLN
Freq: Once | INTRAVENOUS | Status: AC
  Administered 2018-07-21: 14:00:00 via INTRAVENOUS
  Filled 2018-07-21: qty 250

## 2018-07-21 MED ORDER — DIPHENHYDRAMINE HCL 25 MG PO CAPS
ORAL_CAPSULE | ORAL | Status: AC
  Filled 2018-07-21: qty 1

## 2018-07-21 MED ORDER — SODIUM CHLORIDE 0.9% FLUSH
10.0000 mL | INTRAVENOUS | Status: DC | PRN
  Administered 2018-07-21: 10 mL via INTRAVENOUS
  Filled 2018-07-21: qty 10

## 2018-07-21 NOTE — Progress Notes (Signed)
Per Dr. Jana Hakim. Patient is OK to treat with today's labs

## 2018-07-21 NOTE — Progress Notes (Signed)
68 y.o. G69P0010 Married  Caucasian Fe here for annual exam. Menopausal no HRT. Denies vaginal bleeding, still vaginal dryness. Had right breast cancer and Metastasis to liver with chemotherapy and doing well, may stop treatment. Not sexually active vaginally, but together. Sees Dr. Jacelyn Grip PCP for diabetes( stable at this point), hypertension, all stable.  Sees Lorra Hals for healthy eating and weight management. Down another 10 pounds. Continues with Oncology for breast cancer and liver. Feels she is in remission. No fatigue. Good family support. No other health issues today.  Patient's last menstrual period was 10/22/2007.          Sexually active: Yes.    The current method of family planning is vasectomy.    Exercising: No.  exercise Smoker:  no  Review of Systems  Constitutional: Negative.   HENT: Negative.   Eyes: Negative.   Respiratory: Negative.   Cardiovascular: Negative.   Gastrointestinal: Negative.   Genitourinary: Negative.   Musculoskeletal: Negative.   Skin: Negative.   Neurological: Negative.   Endo/Heme/Allergies: Negative.   Psychiatric/Behavioral: Negative.     Health Maintenance: Pap:  07-05-14 neg HPV HR neg, 07-16-17 neg HPV HR neg History of Abnormal Pap: no MMG:  2019 neg hx of breast cancer Self Breast exams: yes Colonoscopy:  2020 BMD:   2018 normal TDaP:  UTD Shingles: 2015 Pneumonia: 2020 Hep C and HIV: hep c neg 2017, HIV neg 2016 Labs: if needed   reports that she has never smoked. She has never used smokeless tobacco. She reports current alcohol use of about 1.0 standard drinks of alcohol per week. She reports that she does not use drugs.  Past Medical History:  Diagnosis Date  . Anemia    hx of - during 1st round chemo  . Anxiety   . Arthritis    in fingers, shoulders  . Back pain   . Balance problem   . Breast cancer ([place])   . Breast cancer of upper-outer quadrant of right female breast ([place]) 07/22/2014  . Bunion, right foot   .  Clumsiness   . Constipation   . Depression   . Diabetes mellitus without complication ([place])    53+ years  . Eating disorder    binge eating  . Epistaxis 08/26/2014  . Fatty liver   . HCAP (healthcare-associated pneumonia) 12/18/2014  . Headache   . Heart murmur    never had any problems  . History of radiation therapy 04/05/15-05/19/15   right chest wall was treated to 50.4 Gy in 28 fractions, right mastectomy scar was treated to 10 Gy in 5 fractions  . Hyperlipidemia   . Hypertension   . Joint pain   . Multiple food allergies   . Neuromuscular disorder ([place])    diabetic neuropathy  . Obesity   . Obstructive sleep apnea on CPAP    uses cpap nightly  . Ovarian cyst rupture    possible  . Pneumonia   . Pneumonia 11/2014  . Radiation 04/05/15-05/19/15   right chest wall 50.4 Gy, mastectomy scar 10 Gy  . Sleep apnea   . Thyroid nodule 06/2014    Past Surgical History:  Procedure Laterality Date  . APPENDECTOMY    . BIOPSY THYROID Bilateral 06/2014   2 nodules - Benign   . BREAST LUMPECTOMY WITH RADIOACTIVE SEED AND SENTINEL LYMPH NODE BIOPSY Right 12/27/2014   Procedure: BREAST LUMPECTOMY WITH RADIOACTIVE SEED AND SENTINEL LYMPH NODE BIOPSY;  Surgeon: Stark Klein, MD;  Location: [facility];  Service: General;  Laterality: Right;  . BREAST REDUCTION SURGERY Bilateral 01/06/2015   Procedure: BILATERAL BREAST REDUCTION, RIGHT ONCOPLASTIC RECONSTRUCTION),LEFT BREAST REDUCTION FOR SYMMETRY;  Surgeon: [name], MD;  Location: [facility];  Service: Plastics;  Laterality: Bilateral;  . BREATH TEK H PYLORI N/A 09/15/2012   Procedure: BREATH TEK H PYLORI;  Surgeon: Pedro Earls, MD;  Location: [facility];  Service: General;  Laterality: N/A;  . BUNIONECTOMY Left 12/2013  . CARPAL TUNNEL RELEASE Right   . CARPAL TUNNEL RELEASE Left   . CATARACT EXTRACTION     6/19  . COLONOSCOPY    . DUPUYTREN CONTRACTURE RELEASE    . GASTRIC ROUX-EN-Y N/A 11/02/2012   Procedure:  LAPAROSCOPIC ROUX-EN-Y GASTRIC;  Surgeon: Pedro Earls, MD;  Location: [facility];  Service: General;  Laterality: N/A;  . IR GENERIC HISTORICAL  03/18/2016   IR US GUIDE VASC ACCESS LEFT 03/18/2016 Markus Daft, MD [facility]  . IR GENERIC HISTORICAL  03/18/2016   IR FLUORO GUIDE CV LINE LEFT 03/18/2016 Markus Daft, MD [facility]  . LAPAROSCOPIC APPENDECTOMY N/A 07/22/2015   Procedure: APPENDECTOMY LAPAROSCOPIC;  Surgeon: Michael Boston, MD;  Location: [facility];  Service: General;  Laterality: N/A;  . MASTECTOMY Right 02/15/2015   modified  . MODIFIED MASTECTOMY Right 02/15/2015   Procedure: RIGHT MODIFIED RADICAL MASTECTOMY;  Surgeon: Stark Klein, MD;  Location: [facility];  Service: General;  Laterality: Right;  . PORT-A-CATH REMOVAL Left 10/10/2015   Procedure: PORT REMOVAL;  Surgeon: Stark Klein, MD;  Location: [facility];  Service: General;  Laterality: Left;  . PORTACATH PLACEMENT Left 08/02/2014   Procedure: INSERTION PORT-A-CATH;  Surgeon: Stark Klein, MD;  Location: [facility];  Service: General;  Laterality: Left;  . PORTACATH PLACEMENT N/A 11/05/2016   Procedure: INSERTION PORT-A-CATH;  Surgeon: Stark Klein, MD;  Location: [facility];  Service: General;  Laterality: N/A;  . ROTATOR CUFF REPAIR Right 2005  . SCAR REVISION Right 12/08/2015   Procedure: COMPLEX REPAIR RIGHT CHEST 10-15CM;  Surgeon: [name], MD;  Location: [facility];  Service: Plastics;  Laterality: Right;  . TOE SURGERY  1970s   bone spur  . TRIGGER FINGER RELEASE     x3    Current Outpatient Medications  Medication Sig Dispense Refill  . acetaminophen (TYLENOL) 325 MG tablet Take 650 mg by mouth 2 (two) times daily as needed for moderate pain or headache.     [name] buPROPion (WELLBUTRIN SR) 200 MG 12 hr tablet TAKE 1 TABLET BY MOUTH DAILY AT 12 NOON. 30 tablet 0  . CALCIUM CITRATE-VITAMIN D PO Take 1 tablet by mouth 3 (three) times daily.     . cholecalciferol (VITAMIN D) 1000 units tablet Take  1,000 Units by mouth daily.    . Cyanocobalamin (VITAMIN B-12 PO) Take 1 tablet by mouth every 14 (fourteen) days.    . diphenhydrAMINE (BENADRYL) 25 MG tablet Take 25 mg by mouth daily as needed for allergies or sleep.     . Insulin Glargine (LANTUS SOLOSTAR) 100 UNIT/ML Solostar Pen Inject 12 Units into the skin daily. 5 pen 0  . Insulin Pen Needle (BD PEN NEEDLE NANO U/F) 32G X 4 MM MISC 1 Package by Does not apply route daily at 12 noon. 100 each 0  . liraglutide (VICTOZA) 18 MG/3ML SOPN INJECT 1.8MG (0.3 MLS) INTO THE SKIN EVERY MORNING 3 pen 0  . loratadine (CLARITIN) 10 MG tablet Take 10 mg by mouth as needed for allergies.    [name]  losartan (COZAAR) 100 MG tablet Take 1 tablet (100 mg total) by mouth daily. 30 tablet 0  . Melatonin 3 MG CAPS Take 3 mg by mouth at bedtime.     . metFORMIN (GLUCOPHAGE) 500 MG tablet Take 1 tablet (500 mg total) by mouth daily. 30 tablet 0  . ONETOUCH DELICA LANCETS 16S MISC 1 Package by Does not apply route daily. 100 each 0  . Pediatric Multivitamins-Iron (FLINTSTONES PLUS IRON) chewable tablet Chew 1 tablet by mouth 2 (two) times daily.    . Probiotic Product (CVS PROBIOTIC PO) Take 1 capsule by mouth daily.     . prochlorperazine (COMPAZINE) 10 MG tablet Take 1 tablet (10 mg total) by mouth every 6 (six) hours as needed for nausea or vomiting. 30 tablet 0  . Propylene Glycol (SYSTANE BALANCE OP) Place 1-2 drops into both eyes 3 (three) times daily as needed (for dry eyes).     [name] senna-docusate (SENNA PLUS) 8.6-50 MG tablet Take 1 tablet by mouth daily as needed for moderate constipation.     . topiramate (TOPAMAX) 25 MG tablet Take 1 tablet (25 mg total) by mouth daily. 30 tablet 0  . vortioxetine HBr (TRINTELLIX) 20 MG TABS tablet Take 1 tablet (20 mg total) by mouth at bedtime. 30 tablet 0   No current facility-administered medications for this visit.     Family History  Problem Relation Age of Onset  . CVA Mother   . Heart disease Mother   . Sudden  death Mother   . Diabetes Father   . Heart failure Father   . Hypertension Father   . Kidney disease Father   . Obesity Father   . Hypertension Sister   . Diabetes Brother   . Breast cancer Paternal Grandmother 67  . Diabetes Paternal Uncle   . Ovarian cancer Neg Hx     ROS:  Pertinent items are noted in HPI.  Otherwise, a comprehensive ROS was negative.  Exam:   LMP 10/22/2007    Ht Readings from Last 3 Encounters:  06/23/18 5\' 9"  (1.753 m)  05/26/18 5\' 9"  (1.753 m)  05/19/18 5\' 9"  (1.753 m)    General appearance: alert, cooperative and appears stated age Head: Normocephalic, without obvious abnormality, atraumatic Neck: no adenopathy, supple, symmetrical, trachea midline and thyroid normal to inspection and palpation Lungs: clear to auscultation bilaterally Breasts: normal appearance, no masses or tenderness, No nipple retraction or dimpling, No nipple discharge or bleeding, No axillary or supraclavicular adenopathy, mastectomy scarring on right, scarring on left from reduction Heart: regular rate and rhythm Abdomen: soft, non-tender; no masses,  no organomegaly Extremities: extremities normal, atraumatic, no cyanosis or edema Skin: Skin color, texture, turgor normal. No rashes or lesions Lymph nodes: Cervical, supraclavicular, and axillary nodes normal. No abnormal inguinal nodes palpated Neurologic: Grossly normal   Pelvic: External genitalia:  no lesions              Urethra:  normal appearing urethra with no masses, tenderness or lesions              Bartholin's and Skene's: normal                 Vagina: normal appearing vagina with normal color and discharge, no lesions              Cervix: no bleeding following Pap, no cervical motion tenderness and no lesions              Pap taken: Yes.  Bimanual Exam:  Uterus:  normal size, contour, position, consistency, mobility, non-tender and anteverted              Adnexa: normal adnexa and no mass, fullness, tenderness                Rectovaginal: Confirms               Anus:  normal sphincter tone, no lesions  Chaperone present: yes  A:  Well Woman with normal exam  Post menopausal   History of breast cancer with metastasis to liver, on chemotherapy now with oncology  Weight management with specialist  PCP management of diabetes, cholesterol, Vitamin D deficiency  P:   Reviewed health and wellness pertinent to exam  Aware of need to advise if vaginal bleeding  Continue follow up as indicated  Encouraged to continue weight loss as healthy for her health issues  Continue follow up with PCP  Pap smear: yes   counseled on breast self exam, mammography screening, feminine hygiene, menopause, adequate intake of calcium and vitamin D, diet and exercise, Kegel's exercises My thoughts are with her on her cancer journey.  return annually or prn  An After Visit Summary was printed and given to the patient.

## 2018-07-21 NOTE — Patient Instructions (Signed)
[facility] Discharge Instructions for Patients Receiving Chemotherapy  Today you received the following chemotherapy agents Paclitaxel-Protein ([place]) & Trastuzumab-ann [name]).  To help prevent nausea and vomiting after your treatment, we encourage you to take your nausea medication as prescribed.   If you develop nausea and vomiting that is not controlled by your nausea medication, call the clinic.   BELOW ARE SYMPTOMS THAT SHOULD BE REPORTED IMMEDIATELY:  *FEVER GREATER THAN 100.5 F  *CHILLS WITH OR WITHOUT FEVER  NAUSEA AND VOMITING THAT IS NOT CONTROLLED WITH YOUR NAUSEA MEDICATION  *UNUSUAL SHORTNESS OF BREATH  *UNUSUAL BRUISING OR BLEEDING  TENDERNESS IN MOUTH AND THROAT WITH OR WITHOUT PRESENCE OF ULCERS  *URINARY PROBLEMS  *BOWEL PROBLEMS  UNUSUAL RASH Items with * indicate a potential emergency and should be followed up as soon as possible.  Feel free to call the clinic should you have any questions or concerns. The clinic phone number is [phone].  Please show the [place] at check-in to the Emergency Department and triage nurse.  Coronavirus (COVID-19) Are you at risk?  Are you at risk for the Coronavirus (COVID-19)?  To be considered HIGH RISK for Coronavirus (COVID-19), you have to meet the following criteria:  . Traveled to [place], [place], [place], [place] or [place]; or in the [place] to [place], [place], [place], or [place]; and have fever, cough, and shortness of breath within the last 2 weeks of travel OR . Been in close contact with a person diagnosed with COVID-19 within the last 2 weeks and have fever, cough, and shortness of breath . IF YOU DO NOT MEET THESE CRITERIA, YOU ARE CONSIDERED LOW RISK FOR COVID-19.  What to do if you are HIGH RISK for COVID-19?  [name] If you are having a medical emergency, call 911. . Seek medical care right away. Before you go to a doctor's office, urgent care or emergency  department, call ahead and tell them about your recent travel, contact with someone diagnosed with COVID-19, and your symptoms. You should receive instructions from your physician's office regarding next steps of care.  . When you arrive at healthcare provider, tell the healthcare staff immediately you have returned from visiting [place], [place], [place], [place] or [place]; or traveled in the [place] to [place], [place], [place], or [place]; in the last two weeks or you have been in close contact with a person diagnosed with COVID-19 in the last 2 weeks.   . Tell the health care staff about your symptoms: fever, cough and shortness of breath. . After you have been seen by a medical provider, you will be either: o Tested for (COVID-19) and discharged home on quarantine except to seek medical care if symptoms worsen, and asked to  - Stay home and avoid contact with others until you get your results (4-5 days)  - Avoid travel on public transportation if possible (such as bus, train, or airplane) or o Sent to the Emergency Department by EMS for evaluation, COVID-19 testing, and possible admission depending on your condition and test results.  What to do if you are LOW RISK for COVID-19?  Reduce your risk of any infection by using the same precautions used for avoiding the common cold or flu:  [name] Wash your hands often with soap and warm water for at least 20 seconds.  If soap and water are not readily available, use an alcohol-based hand sanitizer with at least 60% alcohol.  [name]  If coughing or sneezing, cover your mouth and nose by coughing or sneezing into the elbow areas of your shirt or coat, into a tissue or into your sleeve (not your hands). . Avoid shaking hands with others and consider head nods or verbal greetings only. . Avoid touching your eyes, nose, or mouth with unwashed hands.  . Avoid close contact with people who are sick. . Avoid places or events with large numbers of people  in one location, like concerts or sporting events. . Carefully consider travel plans you have or are making. . If you are planning any travel outside or inside the [name], visit the CDC's Travelers' Health webpage for the latest health notices. . If you have some symptoms but not all symptoms, continue to monitor at home and seek medical attention if your symptoms worsen. . If you are having a medical emergency, call 911.   [place] / e-Visit: [email or web address]         [facility]: [facility]: [phone]                   [facility]: [phone]

## 2018-07-22 ENCOUNTER — Encounter (INDEPENDENT_AMBULATORY_CARE_PROVIDER_SITE_OTHER): Payer: Self-pay | Admitting: Family Medicine

## 2018-07-22 ENCOUNTER — Telehealth (INDEPENDENT_AMBULATORY_CARE_PROVIDER_SITE_OTHER): Payer: Medicare Other | Admitting: Family Medicine

## 2018-07-22 DIAGNOSIS — E669 Obesity, unspecified: Secondary | ICD-10-CM

## 2018-07-22 DIAGNOSIS — Z6830 Body mass index (BMI) 30.0-30.9, adult: Secondary | ICD-10-CM

## 2018-07-22 DIAGNOSIS — Z794 Long term (current) use of insulin: Secondary | ICD-10-CM

## 2018-07-22 DIAGNOSIS — E1140 Type 2 diabetes mellitus with diabetic neuropathy, unspecified: Secondary | ICD-10-CM | POA: Diagnosis not present

## 2018-07-22 DIAGNOSIS — F3289 Other specified depressive episodes: Secondary | ICD-10-CM

## 2018-07-22 LAB — CYTOLOGY - PAP: Diagnosis: NEGATIVE

## 2018-07-22 MED ORDER — BUPROPION HCL ER (SR) 200 MG PO TB12: ORAL_TABLET | ORAL | 0 refills | Status: DC

## 2018-07-22 MED ORDER — LANTUS SOLOSTAR 100 UNIT/ML ~~LOC~~ SOPN: 12.0000 [IU] | PEN_INJECTOR | Freq: Every day | SUBCUTANEOUS | 0 refills | Status: DC

## 2018-07-22 MED ORDER — VORTIOXETINE HBR 20 MG PO TABS: 20.0000 mg | ORAL_TABLET | Freq: Every day | ORAL | 0 refills | Status: DC

## 2018-07-22 MED ORDER — VICTOZA 18 MG/3ML ~~LOC~~ SOPN: PEN_INJECTOR | SUBCUTANEOUS | 0 refills | Status: DC

## 2018-07-22 MED ORDER — TOPIRAMATE 25 MG PO TABS: 25.0000 mg | ORAL_TABLET | Freq: Every day | ORAL | 0 refills | Status: DC

## 2018-07-23 NOTE — Progress Notes (Signed)
Office: [phone]  /  Fax: [phone] TeleHealth Visit:  Darlene [name] has verbally consented to this TeleHealth visit today. The patient is located in the car, the provider is located at the [facility] office. The participants in this visit include the listed provider, patient, and the patient's spouse, [name]. The visit was conducted today via telephone call (FaceTime failed - changed to telephone call).  HPI:   Chief Complaint: OBESITY Darlene [name] is here to discuss her progress with her obesity treatment plan. She is keeping a food journal with 1500-1600 calories and is following her eating plan approximately 75% of the time. She states she is exercising 0 minutes 0 times per week. [name] continues to do well with maintaining her weight. She has finished her chemotherapy and appetite has been good. She reports her hunger is controlled overall and she is trying to stay active and do more outdoor activities as her exercise.  We were unable to weigh the patient today for this TeleHealth visit. She feels as if she has maintained her weight since her last visit. She has lost 30 lbs since starting treatment with [name].  Diabetes II Darlene [name] has a diagnosis of diabetes type II and she is stable on her medications. [name] states fasting CBG's range between 85 and 95 in the a.m. and between 120 and 150 after lunch. She denies any hypoglycemic episodes. Last A1c was 5.8 on 09/03/2017. She has been working on intensive lifestyle modifications including diet, exercise, and weight loss to help control her blood glucose levels.  Depression with emotional eating behaviors Darlene [name] is struggling with emotional eating and using food for comfort to the extent that it is negatively impacting her health. She often snacks when she is not hungry. Darlene [name] sometimes feels she is out of control and then feels guilty that she made poor food choices. She has been working on behavior  modification techniques to help reduce her emotional eating and has been somewhat successful. Darlene [name] reports her mood is stable on medications, energy is improved, and she is sleeping better. She shows no sign of suicidal or homicidal ideations.  No flowsheet data found.  ASSESSMENT AND PLAN:  Type 2 diabetes mellitus with diabetic neuropathy, with long-term current use of insulin ([place]) - Plan: Insulin Glargine (LANTUS SOLOSTAR) 100 UNIT/ML Solostar Pen, liraglutide (VICTOZA) 18 MG/3ML SOPN  Other depression - with emotional eating - Plan: buPROPion (WELLBUTRIN SR) 200 MG 12 hr tablet, topiramate (TOPAMAX) 25 MG tablet, vortioxetine HBr (TRINTELLIX) 20 MG TABS tablet  Class 1 obesity with serious comorbidity and body mass index (BMI) of 30.0 to 30.9 in adult, unspecified obesity type  PLAN:  Diabetes II [name] has been given extensive diabetes education by myself today including ideal fasting and post-prandial blood glucose readings, individual ideal HgA1c goals  and hypoglycemia prevention. We discussed the importance of good blood sugar control to decrease the likelihood of diabetic complications such as nephropathy, neuropathy, limb loss, blindness, coronary artery disease, and death. We discussed the importance of intensive lifestyle modification including diet, exercise and weight loss as the first line treatment for diabetes. [name] was given a refill on her Lantus #5 pens with 0 refills and Victoza #3 pens with 0 refills. She agrees to follow-up with our clinic in 2 weeks.  Depression with Emotional Eating Behaviors We discussed behavior modification techniques today to help Darlene [name] deal with her emotional eating and depression. Itsel was given a refill on her Wellbutrin #30 with 0 refills, Topamax 25 mg #  30 with 0 refills, and Trintellix 20 mg #30 with 0 refills. She agrees to follow-up with our clinic in 2 weeks.   Obesity Darlene [name] is currently in the action stage of  change. As such, her goal is to continue with weight loss efforts. She has agreed to keep a food journal with 1300-1500 calories and 80+ grams of protein daily. Darlene [name] has been instructed to work up to a goal of 150 minutes of combined cardio and strengthening exercise per week for weight loss and overall health benefits. We discussed the following Behavioral Modification Strategies today: increasing vegetables, increase H20 intake, work on meal planning and easy cooking plans, and better snacking choices.  I spent > than 50% of the 25 minute visit on counseling as documented in the note.   Darlene [name] has agreed to follow-up with our clinic in 2 weeks. She was informed of the importance of frequent follow-up visits to maximize her success with intensive lifestyle modifications for her multiple health conditions.  ALLERGIES: Allergies  Allergen Reactions   Nsaids Other (See Comments)    H/o gastric bypass - avoid NSAIDs   Tape Hives, Rash and Other (See Comments)    Adhesives in bandaids    MEDICATIONS: Current Outpatient Medications on File Prior to Visit  Medication Sig Dispense Refill   acetaminophen (TYLENOL) 325 MG tablet Take 650 mg by mouth 2 (two) times daily as needed for moderate pain or headache.      CALCIUM CITRATE-VITAMIN D PO Take 1 tablet by mouth 3 (three) times daily.      cholecalciferol (VITAMIN D) 1000 units tablet Take 1,000 Units by mouth daily.     Cyanocobalamin (VITAMIN B-12 PO) Take 1 tablet by mouth every 14 (fourteen) days.     diphenhydrAMINE (BENADRYL) 25 MG tablet Take 25 mg by mouth daily as needed for allergies or sleep.      Insulin Pen Needle (BD PEN NEEDLE NANO U/F) 32G X 4 MM MISC 1 Package by Does not apply route daily at 12 noon. 100 each 0   loratadine (CLARITIN) 10 MG tablet Take 10 mg by mouth as needed for allergies.     losartan (COZAAR) 100 MG tablet Take 1 tablet (100 mg total) by mouth daily. 30 tablet 0   metFORMIN (GLUCOPHAGE)  500 MG tablet Take 1 tablet (500 mg total) by mouth daily. 30 tablet 0   ONETOUCH DELICA LANCETS 02H MISC 1 Package by Does not apply route daily. 100 each 0   Pediatric Multivitamins-Iron (FLINTSTONES PLUS IRON) chewable tablet Chew 1 tablet by mouth 2 (two) times daily.     Probiotic Product ([employer] PROBIOTIC PO) Take 1 capsule by mouth daily.      prochlorperazine (COMPAZINE) 10 MG tablet Take 1 tablet (10 mg total) by mouth every 6 (six) hours as needed for nausea or vomiting. 30 tablet 0   Propylene Glycol (SYSTANE BALANCE OP) Place 1-2 drops into both eyes 3 (three) times daily as needed (for dry eyes).      senna-docusate (SENNA PLUS) 8.6-50 MG tablet Take 1 tablet by mouth daily as needed for moderate constipation.      No current facility-administered medications on file prior to visit.     PAST MEDICAL HISTORY: Past Medical History:  Diagnosis Date   Anemia    hx of - during 1st round chemo   Anxiety    Arthritis    in fingers, shoulders   Back pain    Balance problem    Breast  cancer [facility])    Breast cancer of upper-outer quadrant of right female breast ([place]) 07/22/2014   Bunion, right foot    Clumsiness    Constipation    Depression    Diabetes mellitus without complication ([place])    76+ years   Diabetic retinopathy ([place])    torn retina   Eating disorder    binge eating   Epistaxis 08/26/2014   Fatty liver    HCAP (healthcare-associated pneumonia) 12/18/2014   Headache    Heart murmur    never had any problems   History of radiation therapy 04/05/15-05/19/15   right chest wall was treated to 50.4 Gy in 28 fractions, right mastectomy scar was treated to 10 Gy in 5 fractions   Hyperlipidemia    Hypertension    Joint pain    Multiple food allergies    Neuromuscular disorder ([place])    diabetic neuropathy   Obesity    Obstructive sleep apnea on CPAP    uses cpap nightly   Ovarian cyst rupture    possible   Pneumonia    Pneumonia  11/2014   Radiation 04/05/15-05/19/15   right chest wall 50.4 Gy, mastectomy scar 10 Gy   Sleep apnea    Thyroid nodule 06/2014    PAST SURGICAL HISTORY: Past Surgical History:  Procedure Laterality Date   APPENDECTOMY     BIOPSY THYROID Bilateral 06/2014   2 nodules - Benign    BREAST LUMPECTOMY WITH RADIOACTIVE SEED AND SENTINEL LYMPH NODE BIOPSY Right 12/27/2014   Procedure: BREAST LUMPECTOMY WITH RADIOACTIVE SEED AND SENTINEL LYMPH NODE BIOPSY;  Surgeon: Stark Klein, MD;  Location: [place];  Service: General;  Laterality: Right;   BREAST REDUCTION SURGERY Bilateral 01/06/2015   Procedure: BILATERAL BREAST REDUCTION, RIGHT ONCOPLASTIC RECONSTRUCTION),LEFT BREAST REDUCTION FOR SYMMETRY;  Surgeon: [name], MD;  Location: [facility];  Service: Plastics;  Laterality: Bilateral;   BREATH TEK H PYLORI N/A 09/15/2012   Procedure: BREATH TEK H PYLORI;  Surgeon: Pedro Earls, MD;  Location: [facility];  Service: General;  Laterality: N/A;   BUNIONECTOMY Left 12/2013   CARPAL TUNNEL RELEASE Right    CARPAL TUNNEL RELEASE Left    CATARACT EXTRACTION     6/19   COLONOSCOPY     DUPUYTREN CONTRACTURE RELEASE     GASTRIC ROUX-EN-Y N/A 11/02/2012   Procedure: LAPAROSCOPIC ROUX-EN-Y GASTRIC;  Surgeon: Pedro Earls, MD;  Location: [facility];  Service: General;  Laterality: N/A;   IR GENERIC HISTORICAL  03/18/2016   IR US GUIDE VASC ACCESS LEFT 03/18/2016 Markus Daft, MD [facility]   IR GENERIC HISTORICAL  03/18/2016   IR FLUORO GUIDE CV LINE LEFT 03/18/2016 Markus Daft, MD [facility]   LAPAROSCOPIC APPENDECTOMY N/A 07/22/2015   Procedure: APPENDECTOMY LAPAROSCOPIC;  Surgeon: Michael Boston, MD;  Location: [facility];  Service: General;  Laterality: N/A;   MASTECTOMY Right 02/15/2015   modified   MODIFIED MASTECTOMY Right 02/15/2015   Procedure: RIGHT MODIFIED RADICAL MASTECTOMY;  Surgeon: Stark Klein, MD;  Location: [facility] [facility];  Service: General;  Laterality: Right;    PORT-A-CATH REMOVAL Left 10/10/2015   Procedure: PORT REMOVAL;  Surgeon: Stark Klein, MD;  Location: [facility];  Service: General;  Laterality: Left;   PORTACATH PLACEMENT Left 08/02/2014   Procedure: INSERTION PORT-A-CATH;  Surgeon: Stark Klein, MD;  Location: [facility];  Service: General;  Laterality: Left;   PORTACATH PLACEMENT N/A 11/05/2016   Procedure: INSERTION PORT-A-CATH;  Surgeon: Stark Klein, MD;  Location: [place];  Service: General;  Laterality: N/A;   RETINAL LASER PROCEDURE     retina tear on left eye   ROTATOR CUFF REPAIR Right 2005   SCAR REVISION Right 12/08/2015   Procedure: COMPLEX REPAIR RIGHT CHEST 10-15CM;  Surgeon: [name], MD;  Location: [facility];  Service: Plastics;  Laterality: Right;   TOE SURGERY  1970s   bone spur   TRIGGER FINGER RELEASE     x3    SOCIAL HISTORY: Social History   Tobacco Use   Smoking status: Never Smoker   Smokeless tobacco: Never Used  Substance Use Topics   Alcohol use: Yes    Alcohol/week: 1.0 - 2.0 standard drinks    Types: 1 - 2 Standard drinks or equivalent per week    Comment: social   Drug use: No    FAMILY HISTORY: Family History  Problem Relation Age of Onset   CVA Mother    Heart disease Mother    Sudden death Mother    Diabetes Father    Heart failure Father    Hypertension Father    Kidney disease Father    Obesity Father    Hypertension Sister    Diabetes Brother    Breast cancer Paternal Grandmother 61   Diabetes Paternal Uncle    Ovarian cancer Neg Hx    ROS: Review of Systems  Endo/Heme/Allergies:       Negative for hypoglycemia.  Psychiatric/Behavioral: Positive for depression (emotional eating). Negative for suicidal ideas.       Negative for homicidal ideas.   PHYSICAL EXAM: Pt in no acute distress  RECENT LABS AND TESTS: BMET    Component Value Date/Time   NA 141 07/21/2018 1243   NA 143 03/11/2017 0841   NA 140  01/07/2017 0758   K 4.4 07/21/2018 1243   K 4.1 01/07/2017 0758   CL 112 (H) 07/21/2018 1243   CO2 21 (L) 07/21/2018 1243   CO2 24 01/07/2017 0758   GLUCOSE 116 (H) 07/21/2018 1243   GLUCOSE 134 01/07/2017 0758   BUN 41 (H) 07/21/2018 1243   BUN 40 (H) 03/11/2017 0841   BUN 33.6 (H) 01/07/2017 0758   CREATININE 1.83 (H) 07/21/2018 1243   CREATININE 1.62 (H) 04/28/2018 0915   CREATININE 1.5 (H) 01/07/2017 0758   CALCIUM 8.8 (L) 07/21/2018 1243   CALCIUM 9.3 01/07/2017 0758   GFRNONAA 28 (L) 07/21/2018 1243   GFRNONAA 33 (L) 04/28/2018 0915   GFRNONAA 55 (L) 02/18/2013 0827   GFRAA 33 (L) 07/21/2018 1243   GFRAA 38 (L) 04/28/2018 0915   GFRAA 63 02/18/2013 0827   Lab Results  Component Value Date   HGBA1C 5.8 (H) 09/03/2017   HGBA1C 6.1 (H) 03/11/2017   HGBA1C 8.2 (H) 11/05/2016   HGBA1C 8.1 (H) 08/09/2016   HGBA1C 6.5 (H) 01/03/2015   Lab Results  Component Value Date   INSULIN 15.3 03/11/2017   INSULIN 18.3 08/09/2016   CBC    Component Value Date/Time   WBC 3.9 (L) 07/21/2018 1243   RBC 2.97 (L) 07/21/2018 1243   HGB 9.4 (L) 07/21/2018 1243   HGB 10.6 (L) 12/16/2017 0806   HGB 10.8 (L) 01/07/2017 0758   HCT 28.6 (L) 07/21/2018 1243   HCT 31.9 (L) 01/07/2017 0758   PLT 122 (L) 07/21/2018 1243   PLT 83 (L) 12/16/2017 0806   PLT 126 (L) 01/07/2017 0758   MCV 96.3 07/21/2018 1243   MCV 90.8 01/07/2017 0758  MCH 31.6 07/21/2018 1243   MCHC 32.9 07/21/2018 1243   RDW 14.6 07/21/2018 1243   RDW 14.8 (H) 01/07/2017 0758   LYMPHSABS 0.9 07/21/2018 1243   LYMPHSABS 1.1 01/07/2017 0758   MONOABS 0.4 07/21/2018 1243   MONOABS 0.3 01/07/2017 0758   EOSABS 0.1 07/21/2018 1243   EOSABS 0.1 01/07/2017 0758   EOSABS 0.1 08/09/2016 1229   BASOSABS 0.0 07/21/2018 1243   BASOSABS 0.0 01/07/2017 0758   Iron/TIBC/Ferritin/ %Sat    Component Value Date/Time   IRON 58 02/18/2013 0827   TIBC 305 02/18/2013 0827   FERRITIN 133 10/31/2014 0816   IRONPCTSAT 19 (L)  02/18/2013 0827   Lipid Panel     Component Value Date/Time   CHOL 160 09/03/2017 1349   TRIG 111 09/03/2017 1349   HDL 59 09/03/2017 1349   CHOLHDL 2.9 02/18/2013 0827   VLDL 23 02/18/2013 0827   LDLCALC 79 09/03/2017 1349   Hepatic Function Panel     Component Value Date/Time   PROT 6.7 07/21/2018 1243   PROT 6.9 03/11/2017 0841   PROT 6.7 01/07/2017 0758   ALBUMIN 4.0 07/21/2018 1243   ALBUMIN 4.5 03/11/2017 0841   ALBUMIN 3.6 01/07/2017 0758   AST 72 (H) 07/21/2018 1243   AST 43 (H) 04/28/2018 0915   AST 51 (H) 01/07/2017 0758   ALT 104 (H) 07/21/2018 1243   ALT 61 (H) 04/28/2018 0915   ALT 82 (H) 01/07/2017 0758   ALKPHOS 106 07/21/2018 1243   ALKPHOS 115 01/07/2017 0758   BILITOT 0.6 07/21/2018 1243   BILITOT 0.6 04/28/2018 0915   BILITOT 0.72 01/07/2017 0758      Component Value Date/Time   TSH 1.54 02/07/2017   TSH 2.170 08/09/2016 1229   Results for Santoni, Lakethia L "[name]" (MRN [id number]) as of 07/23/2018 13:33  Ref. Range 09/03/2017 13:49  Vitamin D, 25-Hydroxy Latest Ref Range: 30.0 - 100.0 ng/mL 58.3   I, Michaelene Song, am acting as Location manager for Dennard Nip, MD I have reviewed the above documentation for accuracy and completeness, and I agree with the above. -Dennard Nip, MD

## 2018-08-04 ENCOUNTER — Inpatient Hospital Stay: Payer: Medicare Other | Attending: Oncology

## 2018-08-04 ENCOUNTER — Inpatient Hospital Stay: Payer: Medicare Other

## 2018-08-04 ENCOUNTER — Other Ambulatory Visit: Payer: Self-pay | Admitting: *Deleted

## 2018-08-04 ENCOUNTER — Other Ambulatory Visit: Payer: Self-pay

## 2018-08-04 VITALS — BP 126/62 | HR 63 | Temp 98.7°F | Resp 16 | Ht 68.5 in | Wt 194.0 lb

## 2018-08-04 DIAGNOSIS — C50411 Malignant neoplasm of upper-outer quadrant of right female breast: Secondary | ICD-10-CM

## 2018-08-04 DIAGNOSIS — C50911 Malignant neoplasm of unspecified site of right female breast: Secondary | ICD-10-CM

## 2018-08-04 DIAGNOSIS — C787 Secondary malignant neoplasm of liver and intrahepatic bile duct: Secondary | ICD-10-CM

## 2018-08-04 DIAGNOSIS — Z5111 Encounter for antineoplastic chemotherapy: Secondary | ICD-10-CM | POA: Diagnosis present

## 2018-08-04 DIAGNOSIS — C801 Malignant (primary) neoplasm, unspecified: Secondary | ICD-10-CM

## 2018-08-04 DIAGNOSIS — Z171 Estrogen receptor negative status [ER-]: Secondary | ICD-10-CM

## 2018-08-04 DIAGNOSIS — Z95828 Presence of other vascular implants and grafts: Secondary | ICD-10-CM

## 2018-08-04 DIAGNOSIS — Z5112 Encounter for antineoplastic immunotherapy: Secondary | ICD-10-CM | POA: Insufficient documentation

## 2018-08-04 LAB — CBC WITH DIFFERENTIAL/PLATELET
Abs Immature Granulocytes: 0.01 10*3/uL (ref 0.00–0.07)
Basophils Absolute: 0 10*3/uL (ref 0.0–0.1)
Basophils Relative: 1 %
Eosinophils Absolute: 0.1 10*3/uL (ref 0.0–0.5)
Eosinophils Relative: 2 %
HCT: 29.7 % — ABNORMAL LOW (ref 36.0–46.0)
Hemoglobin: 9.8 g/dL — ABNORMAL LOW (ref 12.0–15.0)
Immature Granulocytes: 0 %
Lymphocytes Relative: 26 %
Lymphs Abs: 0.9 10*3/uL (ref 0.7–4.0)
MCH: 32 pg (ref 26.0–34.0)
MCHC: 33 g/dL (ref 30.0–36.0)
MCV: 97.1 fL (ref 80.0–100.0)
Monocytes Absolute: 0.3 10*3/uL (ref 0.1–1.0)
Monocytes Relative: 8 %
Neutro Abs: 2.1 10*3/uL (ref 1.7–7.7)
Neutrophils Relative %: 63 %
Platelets: 123 10*3/uL — ABNORMAL LOW (ref 150–400)
RBC: 3.06 MIL/uL — ABNORMAL LOW (ref 3.87–5.11)
RDW: 14.7 % (ref 11.5–15.5)
WBC: 3.3 10*3/uL — ABNORMAL LOW (ref 4.0–10.5)
nRBC: 0 % (ref 0.0–0.2)

## 2018-08-04 LAB — COMPREHENSIVE METABOLIC PANEL
ALT: 82 U/L — ABNORMAL HIGH (ref 0–44)
AST: 48 U/L — ABNORMAL HIGH (ref 15–41)
Albumin: 3.8 g/dL (ref 3.5–5.0)
Alkaline Phosphatase: 102 U/L (ref 38–126)
Anion gap: 10 (ref 5–15)
BUN: 30 mg/dL — ABNORMAL HIGH (ref 8–23)
CO2: 21 mmol/L — ABNORMAL LOW (ref 22–32)
Calcium: 8.7 mg/dL — ABNORMAL LOW (ref 8.9–10.3)
Chloride: 113 mmol/L — ABNORMAL HIGH (ref 98–111)
Creatinine, Ser: 1.69 mg/dL — ABNORMAL HIGH (ref 0.44–1.00)
GFR calc Af Amer: 36 mL/min — ABNORMAL LOW (ref >60)
GFR calc non Af Amer: 31 mL/min — ABNORMAL LOW (ref >60)
Glucose, Bld: 161 mg/dL — ABNORMAL HIGH (ref 70–99)
Potassium: 4.3 mmol/L (ref 3.5–5.1)
Sodium: 144 mmol/L (ref 135–145)
Total Bilirubin: 0.6 mg/dL (ref 0.3–1.2)
Total Protein: 6.5 g/dL (ref 6.5–8.1)

## 2018-08-04 MED ORDER — TRASTUZUMAB-ANNS CHEMO 420 MG IV SOLR
4.0000 mg/kg | Freq: Once | INTRAVENOUS | Status: AC
  Administered 2018-08-04: 357 mg via INTRAVENOUS
  Filled 2018-08-04: qty 17

## 2018-08-04 MED ORDER — SODIUM CHLORIDE 0.9% FLUSH
10.0000 mL | INTRAVENOUS | Status: DC | PRN
  Administered 2018-08-04: 10 mL via INTRAVENOUS
  Filled 2018-08-04: qty 10

## 2018-08-04 MED ORDER — PROCHLORPERAZINE MALEATE 10 MG PO TABS
ORAL_TABLET | ORAL | Status: AC
  Filled 2018-08-04: qty 1

## 2018-08-04 MED ORDER — ACETAMINOPHEN 325 MG PO TABS
ORAL_TABLET | ORAL | Status: AC
  Filled 2018-08-04: qty 2

## 2018-08-04 MED ORDER — SODIUM CHLORIDE 0.9 % IV SOLN
Freq: Once | INTRAVENOUS | Status: AC
  Administered 2018-08-04: 09:00:00 via INTRAVENOUS
  Filled 2018-08-04: qty 250

## 2018-08-04 MED ORDER — PACLITAXEL PROTEIN-BOUND CHEMO INJECTION 100 MG
80.0000 mg/m2 | Freq: Once | INTRAVENOUS | Status: AC
  Administered 2018-08-04: 175 mg via INTRAVENOUS
  Filled 2018-08-04: qty 35

## 2018-08-04 MED ORDER — HEPARIN SOD (PORK) LOCK FLUSH 100 UNIT/ML IV SOLN
500.0000 [IU] | Freq: Once | INTRAVENOUS | Status: AC | PRN
  Administered 2018-08-04: 500 [IU]
  Filled 2018-08-04: qty 5

## 2018-08-04 MED ORDER — ACETAMINOPHEN 325 MG PO TABS
650.0000 mg | ORAL_TABLET | Freq: Once | ORAL | Status: AC
  Administered 2018-08-04: 650 mg via ORAL

## 2018-08-04 MED ORDER — DIPHENHYDRAMINE HCL 25 MG PO CAPS
ORAL_CAPSULE | ORAL | Status: AC
  Filled 2018-08-04: qty 1

## 2018-08-04 MED ORDER — DIPHENHYDRAMINE HCL 25 MG PO CAPS
25.0000 mg | ORAL_CAPSULE | Freq: Once | ORAL | Status: AC
  Administered 2018-08-04: 25 mg via ORAL

## 2018-08-04 MED ORDER — SODIUM CHLORIDE 0.9% FLUSH
10.0000 mL | INTRAVENOUS | Status: DC | PRN
  Administered 2018-08-04: 10 mL
  Filled 2018-08-04: qty 10

## 2018-08-04 MED ORDER — PROCHLORPERAZINE MALEATE 10 MG PO TABS
10.0000 mg | ORAL_TABLET | Freq: Once | ORAL | Status: AC
  Administered 2018-08-04: 10 mg via ORAL

## 2018-08-04 NOTE — Patient Instructions (Signed)
[facility] Discharge Instructions for Patients Receiving Chemotherapy  Today you received the following chemotherapy agents: Kanjinti, Abraxane.  To help prevent nausea and vomiting after your treatment, we encourage you to take your nausea medication as prescribed.   If you develop nausea and vomiting that is not controlled by your nausea medication, call the clinic.   BELOW ARE SYMPTOMS THAT SHOULD BE REPORTED IMMEDIATELY:  *FEVER GREATER THAN 100.5 F  *CHILLS WITH OR WITHOUT FEVER  NAUSEA AND VOMITING THAT IS NOT CONTROLLED WITH YOUR NAUSEA MEDICATION  *UNUSUAL SHORTNESS OF BREATH  *UNUSUAL BRUISING OR BLEEDING  TENDERNESS IN MOUTH AND THROAT WITH OR WITHOUT PRESENCE OF ULCERS  *URINARY PROBLEMS  *BOWEL PROBLEMS  UNUSUAL RASH Items with * indicate a potential emergency and should be followed up as soon as possible.  Feel free to call the clinic should you have any questions or concerns. The clinic phone number is [phone].  Please show the [place] at check-in to the Emergency Department and triage nurse.

## 2018-08-04 NOTE — Progress Notes (Signed)
Per Dr. Jana Hakim, okay to tx today 08/04/2018 with elevated creatinine and ALT.

## 2018-08-06 ENCOUNTER — Other Ambulatory Visit: Payer: Self-pay

## 2018-08-06 ENCOUNTER — Encounter (INDEPENDENT_AMBULATORY_CARE_PROVIDER_SITE_OTHER): Payer: Self-pay | Admitting: Family Medicine

## 2018-08-06 ENCOUNTER — Telehealth (INDEPENDENT_AMBULATORY_CARE_PROVIDER_SITE_OTHER): Payer: Medicare Other | Admitting: Family Medicine

## 2018-08-06 DIAGNOSIS — E1140 Type 2 diabetes mellitus with diabetic neuropathy, unspecified: Secondary | ICD-10-CM | POA: Diagnosis not present

## 2018-08-06 DIAGNOSIS — Z794 Long term (current) use of insulin: Secondary | ICD-10-CM

## 2018-08-06 DIAGNOSIS — E669 Obesity, unspecified: Secondary | ICD-10-CM

## 2018-08-06 DIAGNOSIS — I10 Essential (primary) hypertension: Secondary | ICD-10-CM

## 2018-08-06 DIAGNOSIS — E66811 Obesity, class 1: Secondary | ICD-10-CM

## 2018-08-06 DIAGNOSIS — F3289 Other specified depressive episodes: Secondary | ICD-10-CM

## 2018-08-06 DIAGNOSIS — Z6830 Body mass index (BMI) 30.0-30.9, adult: Secondary | ICD-10-CM

## 2018-08-06 MED ORDER — BUPROPION HCL ER (SR) 200 MG PO TB12: ORAL_TABLET | ORAL | 0 refills | Status: DC

## 2018-08-06 MED ORDER — BD PEN NEEDLE NANO U/F 32G X 4 MM MISC: 1.0000 | Freq: Every day | 0 refills | Status: DC

## 2018-08-06 MED ORDER — VORTIOXETINE HBR 20 MG PO TABS: 20.0000 mg | ORAL_TABLET | Freq: Every day | ORAL | 0 refills | Status: DC

## 2018-08-06 MED ORDER — METFORMIN HCL 500 MG PO TABS: 500.0000 mg | ORAL_TABLET | Freq: Every day | ORAL | 0 refills | Status: DC

## 2018-08-06 MED ORDER — VICTOZA 18 MG/3ML ~~LOC~~ SOPN: PEN_INJECTOR | SUBCUTANEOUS | 0 refills | Status: DC

## 2018-08-06 MED ORDER — LANTUS SOLOSTAR 100 UNIT/ML ~~LOC~~ SOPN: 12.0000 [IU] | PEN_INJECTOR | Freq: Every day | SUBCUTANEOUS | 0 refills | Status: DC

## 2018-08-06 MED ORDER — LOSARTAN POTASSIUM 100 MG PO TABS: 100.0000 mg | ORAL_TABLET | Freq: Every day | ORAL | 0 refills | Status: DC

## 2018-08-10 NOTE — Progress Notes (Signed)
Office: [phone]  /  Fax: [phone] TeleHealth Visit:  Darlene [name] has verbally consented to this TeleHealth visit today. The patient is located at home, the provider is located at the [facility] office. The participants in this visit include the listed provider and patient. The visit was conducted today via telephone call (FaceTime failed - changed to telephone call).  HPI:   Chief Complaint: OBESITY Darlene [name] is here to discuss her progress with her obesity treatment plan. She is keeping a food journal with 1300-1500 calories and 80+ grams of protein and is following her eating plan approximately 85% of the time. She states she is walking 30 minutes 6 times per week. Megin continues to do well maintaining her weight. She is walking most days approximately 20 minutes and is working on increasing her lean protein. She is getting a bit bored with journaling but is ready to get back on track. We were unable to weigh the patient today for this TeleHealth visit. She feels as if she has maintained her weight since her last visit. She has lost 30 lbs since starting treatment with [name].  Hypertension Darlene [name] is a 68 y.o. female with hypertension.  Darlene [name] denies chest pain, headache, or dizziness. She is working weight loss to help control her blood pressure with the goal of decreasing her risk of heart attack and stroke. She is doing well decreasing sodium in her diet and is walking for exercise. Darlene [name]'s blood pressure is stable on medications.  Diabetes Mellitus Darlene [name] has a diagnosis of diabetes mellitus. Darlene [name] states fasting blood sugars are mostly in the 90's. Last A1c was 5.8 on 09/03/2017. She has been working on intensive lifestyle modifications including diet, exercise, and weight loss to help control her blood glucose levels. She denies hypoglycemia.  Depression with emotional eating behaviors Darlene [name] is struggling with  emotional eating and using food for comfort to the extent that it is negatively impacting her health. She often snacks when she is not hungry. Darlene [name] sometimes feels she is out of control and then feels guilty that she made poor food choices. She has been working on behavior modification techniques to help reduce her emotional eating and has been somewhat successful. Darlene [name]'s health has been improving and her mood is good. She is keeping herself occupied and entertained during COVID-19 isolation. She reports a decrease in emotional eating and shows no sign of suicidal or homicidal ideations.  No flowsheet data found.  ASSESSMENT AND PLAN:  Type 2 diabetes mellitus with diabetic neuropathy, with long-term current use of insulin (HCC) - Plan: liraglutide (VICTOZA) 18 MG/3ML SOPN, Insulin Pen Needle (BD PEN NEEDLE NANO U/F) 32G X 4 MM MISC, Insulin Glargine (LANTUS SOLOSTAR) 100 UNIT/ML Solostar Pen, metFORMIN (GLUCOPHAGE) 500 MG tablet  Essential hypertension - Plan: losartan (COZAAR) 100 MG tablet  Other depression - with emotional eating - Plan: buPROPion (WELLBUTRIN SR) 200 MG 12 hr tablet, vortioxetine HBr (TRINTELLIX) 20 MG TABS tablet  Class 1 obesity with serious comorbidity and body mass index (BMI) of 30.0 to 30.9 in adult, unspecified obesity type  PLAN:  Hypertension We discussed sodium restriction, working on healthy weight loss, and a regular exercise program as the means to achieve improved blood pressure control. Darlene [name] agreed with this plan and agreed to follow up as directed. We will continue to monitor her blood pressure as well as her progress with the above lifestyle modifications. Darlene [name] was given a refill on her losartan 100 mg #30  with 0 refills and agrees to follow-up with our clinic in 3 weeks. She will continue diet and exercise and watch for signs of hypotension as she continues her lifestyle modifications.  Diabetes Mellitus Darlene [name] has been given extensive  diabetes education by myself today including ideal fasting and post-prandial blood glucose readings, individual ideal HgA1c goals  and hypoglycemia prevention. We discussed the importance of good blood sugar control to decrease the likelihood of diabetic complications such as nephropathy, neuropathy, limb loss, blindness, coronary artery disease, and death. We discussed the importance of intensive lifestyle modification including diet, exercise and weight loss as the first line treatment for diabetes. Darlene [name] was given refills on Lantus, metformin, Victoza, and nano needles for 1 month and agrees to follow-up with our clinic in 3 weeks.  Depression with Emotional Eating Behaviors We discussed behavior modification techniques today to help Cythnia deal with her emotional eating and depression. Keia was given refills on Trintellix and Wellbutrin and she agrees to follow-up with our clinic in 3 weeks.  Obesity Darlene [name] is currently in the action stage of change. As such, her goal is to continue with weight loss efforts. She has agreed to keep a food journal with 1300-1500 calories and 80+ grams of protein.  Darlene [name] has been instructed to work up to a goal of 150 minutes of combined cardio and strengthening exercise per week for weight loss and overall health benefits. We discussed the following Behavioral Modification Strategies today: no skipping meals, work on meal planning and easy cooking plans.  Darlene [name] has agreed to follow-up with our clinic in 3 weeks. She was informed of the importance of frequent follow-up visits to maximize her success with intensive lifestyle modifications for her multiple health conditions.  ALLERGIES: Allergies  Allergen Reactions  . Nsaids Other (See Comments)    H/o gastric bypass - avoid NSAIDs  . Tape Hives, Rash and Other (See Comments)    Adhesives in bandaids    MEDICATIONS: Current Outpatient Medications on File Prior to Visit  Medication Sig  Dispense Refill  . acetaminophen (TYLENOL) 325 MG tablet Take 650 mg by mouth 2 (two) times daily as needed for moderate pain or headache.     Darlene [name] CALCIUM CITRATE-VITAMIN D PO Take 1 tablet by mouth 3 (three) times daily.     . cholecalciferol (VITAMIN D) 1000 units tablet Take 1,000 Units by mouth daily.    . Cyanocobalamin (VITAMIN B-12 PO) Take 1 tablet by mouth every 14 (fourteen) days.    . diphenhydrAMINE (BENADRYL) 25 MG tablet Take 25 mg by mouth daily as needed for allergies or sleep.     Darlene [name] loratadine (CLARITIN) 10 MG tablet Take 10 mg by mouth as needed for allergies.    Glory Rosebush DELICA LANCETS 09G MISC 1 Package by Does not apply route daily. 100 each 0  . Pediatric Multivitamins-Iron (FLINTSTONES PLUS IRON) chewable tablet Chew 1 tablet by mouth 2 (two) times daily.    . Probiotic Product ([employer] PROBIOTIC PO) Take 1 capsule by mouth daily.     . prochlorperazine (COMPAZINE) 10 MG tablet Take 1 tablet (10 mg total) by mouth every 6 (six) hours as needed for nausea or vomiting. 30 tablet 0  . Propylene Glycol (SYSTANE BALANCE OP) Place 1-2 drops into both eyes 3 (three) times daily as needed (for dry eyes).     Darlene [name] senna-docusate (SENNA PLUS) 8.6-50 MG tablet Take 1 tablet by mouth daily as needed for moderate constipation.     . topiramate (TOPAMAX)  25 MG tablet Take 1 tablet (25 mg total) by mouth daily. 30 tablet 0   No current facility-administered medications on file prior to visit.     PAST MEDICAL HISTORY: Past Medical History:  Diagnosis Date  . Anemia    hx of - during 1st round chemo  . Anxiety   . Arthritis    in fingers, shoulders  . Back pain   . Balance problem   . Breast cancer ([place])   . Breast cancer of upper-outer quadrant of right female breast ([place]) 07/22/2014  . Bunion, right foot   . Clumsiness   . Constipation   . Depression   . Diabetes mellitus without complication ([place])    78+ years  . Diabetic retinopathy ([place])    torn retina  . Eating disorder     binge eating  . Epistaxis 08/26/2014  . Fatty liver   . HCAP (healthcare-associated pneumonia) 12/18/2014  . Headache   . Heart murmur    never had any problems  . History of radiation therapy 04/05/15-05/19/15   right chest wall was treated to 50.4 Gy in 28 fractions, right mastectomy scar was treated to 10 Gy in 5 fractions  . Hyperlipidemia   . Hypertension   . Joint pain   . Multiple food allergies   . Neuromuscular disorder ([place])    diabetic neuropathy  . Obesity   . Obstructive sleep apnea on CPAP    uses cpap nightly  . Ovarian cyst rupture    possible  . Pneumonia   . Pneumonia 11/2014  . Radiation 04/05/15-05/19/15   right chest wall 50.4 Gy, mastectomy scar 10 Gy  . Sleep apnea   . Thyroid nodule 06/2014    PAST SURGICAL HISTORY: Past Surgical History:  Procedure Laterality Date  . APPENDECTOMY    . BIOPSY THYROID Bilateral 06/2014   2 nodules - Benign   . BREAST LUMPECTOMY WITH RADIOACTIVE SEED AND SENTINEL LYMPH NODE BIOPSY Right 12/27/2014   Procedure: BREAST LUMPECTOMY WITH RADIOACTIVE SEED AND SENTINEL LYMPH NODE BIOPSY;  Surgeon: Stark Klein, MD;  Location: [place];  Service: General;  Laterality: Right;  . BREAST REDUCTION SURGERY Bilateral 01/06/2015   Procedure: BILATERAL BREAST REDUCTION, RIGHT ONCOPLASTIC RECONSTRUCTION),LEFT BREAST REDUCTION FOR SYMMETRY;  Surgeon: [name], MD;  Location: [place];  Service: Plastics;  Laterality: Bilateral;  . BREATH TEK H PYLORI N/A 09/15/2012   Procedure: BREATH TEK H PYLORI;  Surgeon: Pedro Earls, MD;  Location: [facility];  Service: General;  Laterality: N/A;  . BUNIONECTOMY Left 12/2013  . CARPAL TUNNEL RELEASE Right   . CARPAL TUNNEL RELEASE Left   . CATARACT EXTRACTION     6/19  . COLONOSCOPY    . DUPUYTREN CONTRACTURE RELEASE    . GASTRIC ROUX-EN-Y N/A 11/02/2012   Procedure: LAPAROSCOPIC ROUX-EN-Y GASTRIC;  Surgeon: Pedro Earls, MD;  Location: [facility];  Service: General;   Laterality: N/A;  . IR GENERIC HISTORICAL  03/18/2016   IR US GUIDE VASC ACCESS LEFT 03/18/2016 Markus Daft, MD [facility]  . IR GENERIC HISTORICAL  03/18/2016   IR FLUORO GUIDE CV LINE LEFT 03/18/2016 Markus Daft, MD [facility]  . LAPAROSCOPIC APPENDECTOMY N/A 07/22/2015   Procedure: APPENDECTOMY LAPAROSCOPIC;  Surgeon: Michael Boston, MD;  Location: [facility];  Service: General;  Laterality: N/A;  . MASTECTOMY Right 02/15/2015   modified  . MODIFIED MASTECTOMY Right 02/15/2015   Procedure: RIGHT MODIFIED RADICAL MASTECTOMY;  Surgeon: Stark Klein, MD;  [facility];  Service: General;  Laterality: Right;  . PORT-A-CATH REMOVAL Left 10/10/2015   Procedure: PORT REMOVAL;  Surgeon: Stark Klein, MD;  Location: [place];  Service: General;  Laterality: Left;  . PORTACATH PLACEMENT Left 08/02/2014   Procedure: INSERTION PORT-A-CATH;  Surgeon: Stark Klein, MD;  Location: [place];  Service: General;  Laterality: Left;  . PORTACATH PLACEMENT N/A 11/05/2016   Procedure: INSERTION PORT-A-CATH;  Surgeon: Stark Klein, MD;  Location: [place];  Service: General;  Laterality: N/A;  . RETINAL LASER PROCEDURE     retina tear on left eye  . ROTATOR CUFF REPAIR Right 2005  . SCAR REVISION Right 12/08/2015   Procedure: COMPLEX REPAIR RIGHT CHEST 10-15CM;  Surgeon: [name], MD;  Location: [place];  Service: Plastics;  Laterality: Right;  . TOE SURGERY  1970s   bone spur  . TRIGGER FINGER RELEASE     x3    SOCIAL HISTORY: Social History   Tobacco Use  . Smoking status: Never Smoker  . Smokeless tobacco: Never Used  Substance Use Topics  . Alcohol use: Yes    Alcohol/week: 1.0 - 2.0 standard drinks    Types: 1 - 2 Standard drinks or equivalent per week    Comment: social  . Drug use: No    FAMILY HISTORY: Family History  Problem Relation Age of Onset  . CVA Mother   . Heart disease Mother   . Sudden death Mother   . Diabetes Father   . Heart failure  Father   . Hypertension Father   . Kidney disease Father   . Obesity Father   . Hypertension Sister   . Diabetes Brother   . Breast cancer Paternal Grandmother 55  . Diabetes Paternal Uncle   . Ovarian cancer Neg Hx    ROS: Review of Systems  Cardiovascular: Negative for chest pain.  Neurological: Negative for dizziness and headaches.  Endo/Heme/Allergies:       Negative for hypoglycemia.  Psychiatric/Behavioral: Positive for depression (emotional eating). Negative for suicidal ideas.       Negative for homicidal ideas.   PHYSICAL EXAM: Pt in no acute distress  RECENT LABS AND TESTS: BMET    Component Value Date/Time   NA 144 08/04/2018 0822   NA 143 03/11/2017 0841   NA 140 01/07/2017 0758   K 4.3 08/04/2018 0822   K 4.1 01/07/2017 0758   CL 113 (H) 08/04/2018 0822   CO2 21 (L) 08/04/2018 0822   CO2 24 01/07/2017 0758   GLUCOSE 161 (H) 08/04/2018 0822   GLUCOSE 134 01/07/2017 0758   BUN 30 (H) 08/04/2018 0822   BUN 40 (H) 03/11/2017 0841   BUN 33.6 (H) 01/07/2017 0758   CREATININE 1.69 (H) 08/04/2018 0822   CREATININE 1.62 (H) 04/28/2018 0915   CREATININE 1.5 (H) 01/07/2017 0758   CALCIUM 8.7 (L) 08/04/2018 0822   CALCIUM 9.3 01/07/2017 0758   GFRNONAA 31 (L) 08/04/2018 0822   GFRNONAA 33 (L) 04/28/2018 0915   GFRNONAA 55 (L) 02/18/2013 0827   GFRAA 36 (L) 08/04/2018 0822   GFRAA 38 (L) 04/28/2018 0915   GFRAA 63 02/18/2013 0827   Lab Results  Component Value Date   HGBA1C 5.8 (H) 09/03/2017   HGBA1C 6.1 (H) 03/11/2017   HGBA1C 8.2 (H) 11/05/2016   HGBA1C 8.1 (H) 08/09/2016   HGBA1C 6.5 (H) 01/03/2015   Lab Results  Component Value Date   INSULIN 15.3 03/11/2017   INSULIN 18.3 08/09/2016  CBC    Component Value Date/Time   WBC 3.3 (L) 08/04/2018 0822   RBC 3.06 (L) 08/04/2018 0822   HGB 9.8 (L) 08/04/2018 0822   HGB 10.6 (L) 12/16/2017 0806   HGB 10.8 (L) 01/07/2017 0758   HCT 29.7 (L) 08/04/2018 0822   HCT 31.9 (L) 01/07/2017 0758   PLT 123  (L) 08/04/2018 0822   PLT 83 (L) 12/16/2017 0806   PLT 126 (L) 01/07/2017 0758   MCV 97.1 08/04/2018 0822   MCV 90.8 01/07/2017 0758   MCH 32.0 08/04/2018 0822   MCHC 33.0 08/04/2018 0822   RDW 14.7 08/04/2018 0822   RDW 14.8 (H) 01/07/2017 0758   LYMPHSABS 0.9 08/04/2018 0822   LYMPHSABS 1.1 01/07/2017 0758   MONOABS 0.3 08/04/2018 0822   MONOABS 0.3 01/07/2017 0758   EOSABS 0.1 08/04/2018 0822   EOSABS 0.1 01/07/2017 0758   EOSABS 0.1 08/09/2016 1229   BASOSABS 0.0 08/04/2018 0822   BASOSABS 0.0 01/07/2017 0758   Iron/TIBC/Ferritin/ %Sat    Component Value Date/Time   IRON 58 02/18/2013 0827   TIBC 305 02/18/2013 0827   FERRITIN 133 10/31/2014 0816   IRONPCTSAT 19 (L) 02/18/2013 0827   Lipid Panel     Component Value Date/Time   CHOL 160 09/03/2017 1349   TRIG 111 09/03/2017 1349   HDL 59 09/03/2017 1349   CHOLHDL 2.9 02/18/2013 0827   VLDL 23 02/18/2013 0827   LDLCALC 79 09/03/2017 1349   Hepatic Function Panel     Component Value Date/Time   PROT 6.5 08/04/2018 0822   PROT 6.9 03/11/2017 0841   PROT 6.7 01/07/2017 0758   ALBUMIN 3.8 08/04/2018 0822   ALBUMIN 4.5 03/11/2017 0841   ALBUMIN 3.6 01/07/2017 0758   AST 48 (H) 08/04/2018 0822   AST 43 (H) 04/28/2018 0915   AST 51 (H) 01/07/2017 0758   ALT 82 (H) 08/04/2018 0822   ALT 61 (H) 04/28/2018 0915   ALT 82 (H) 01/07/2017 0758   ALKPHOS 102 08/04/2018 0822   ALKPHOS 115 01/07/2017 0758   BILITOT 0.6 08/04/2018 0822   BILITOT 0.6 04/28/2018 0915   BILITOT 0.72 01/07/2017 0758      Component Value Date/Time   TSH 1.54 02/07/2017   TSH 2.170 08/09/2016 1229   Results for Borchers, Sparkles L "[name]" (MRN [id number]) as of 08/10/2018 11:32  Ref. Range 09/03/2017 13:49  Vitamin D, 25-Hydroxy Latest Ref Range: 30.0 - 100.0 ng/mL 58.3   I, Michaelene Song, am acting as Location manager for Dennard Nip, MD I have reviewed the above documentation for accuracy and completeness, and I agree with the above.  -Dennard Nip, MD

## 2018-08-11 ENCOUNTER — Ambulatory Visit: Payer: Medicare Other | Admitting: Podiatry

## 2018-08-11 ENCOUNTER — Ambulatory Visit (INDEPENDENT_AMBULATORY_CARE_PROVIDER_SITE_OTHER): Payer: Medicare Other

## 2018-08-11 ENCOUNTER — Other Ambulatory Visit: Payer: Self-pay | Admitting: Podiatry

## 2018-08-11 ENCOUNTER — Other Ambulatory Visit: Payer: Self-pay

## 2018-08-11 VITALS — Temp 98.5°F

## 2018-08-11 DIAGNOSIS — I7090 Unspecified atherosclerosis: Secondary | ICD-10-CM

## 2018-08-11 DIAGNOSIS — M21619 Bunion of unspecified foot: Secondary | ICD-10-CM | POA: Diagnosis not present

## 2018-08-11 DIAGNOSIS — B351 Tinea unguium: Secondary | ICD-10-CM

## 2018-08-11 DIAGNOSIS — L603 Nail dystrophy: Secondary | ICD-10-CM

## 2018-08-11 DIAGNOSIS — M79671 Pain in right foot: Secondary | ICD-10-CM

## 2018-08-11 DIAGNOSIS — M21611 Bunion of right foot: Secondary | ICD-10-CM

## 2018-08-12 NOTE — Progress Notes (Signed)
Subjective: 68 year old female presents the office today for concerns of the right second digit toenail becoming thickened discolored and is not growing.  She is in the nail is tender to the corners but denies any drainage or pus.  This is been ongoing for 7 months.  She thinks that there was a bruise in the nail but has not been growing out.  Also showed discussed bunion surgery on the right side.  She states the bunion still painful on a regular basis.  She is tried padding, offloading. Denies any systemic complaints such as fevers, chills, nausea, vomiting. No acute changes since last appointment, and no other complaints at this time.   Objective: AAO x3, NAD DP/PT pulses palpable bilaterally, CRT less than 3 seconds The right second digit toenails hypertrophic, dystrophic with yellow-brown discoloration as well as subungual hematoma present.  Upon debridement there is no extension any hyperpigmentation into the surrounding skin.  No drainage or pus.   Moderate bunion deformity is present on the right foot.  Tenderness palpation on the medial aspect of the bunion site.  Hypermobility present of the first ray although mild.  No pain or crepitation with MPJ range of motion.  No open lesions or pre-ulcerative lesions.  No pain with calf compression, swelling, warmth, erythema  Assessment: Right second digit onychodystrophy/subungual hematoma; symptomatic bunion  Plan: -All treatment options discussed with the patient including all alternatives, risks, complications.  -X-rays were obtained and reviewed with the patient.  Significant bunion deformities present.  No evidence of acute fracture. -In regards to the right second toe I debrided the nail without any complications or bleeding I sent this for culture/biopsy.  Over the nail continue to grow out.  Monitor for any signs or symptoms of infection. -Regards the bunion discussed surgical intervention.  Discussed options.  She would need a Lapidus  bunionectomy however due to the recovery she wants to do a less aggressive surgery.  I discussed also bunionectomy with screw fixation.  Discussed changing reoccurrence.  Discussed the surgery as well as alternatives, risks, complications.  Will order arterial studies given arterial calcifications on x-ray. -Patient encouraged to call the office with any questions, concerns, change in symptoms.   Trula Slade DPM

## 2018-08-13 ENCOUNTER — Other Ambulatory Visit: Payer: Self-pay | Admitting: Podiatry

## 2018-08-13 DIAGNOSIS — I7090 Unspecified atherosclerosis: Secondary | ICD-10-CM

## 2018-08-13 NOTE — Addendum Note (Signed)
Addended by: [name] Mon R on: 08/13/2018 07:23 AM   Modules accepted: Orders

## 2018-08-13 NOTE — Addendum Note (Signed)
Addended by: [name] Mon R on: 08/13/2018 07:56 AM   Modules accepted: Orders

## 2018-08-14 ENCOUNTER — Other Ambulatory Visit: Payer: Self-pay

## 2018-08-14 ENCOUNTER — Telehealth: Payer: Self-pay | Admitting: *Deleted

## 2018-08-14 ENCOUNTER — Ambulatory Visit (HOSPITAL_COMMUNITY)
Admission: RE | Admit: 2018-08-14 | Discharge: 2018-08-14 | Disposition: A | Discharge status: Home / Self Care | Payer: Medicare Other | Source: Ambulatory Visit | Attending: Internal Medicine | Admitting: Internal Medicine

## 2018-08-14 DIAGNOSIS — I7090 Unspecified atherosclerosis: Secondary | ICD-10-CM | POA: Insufficient documentation

## 2018-08-14 NOTE — Telephone Encounter (Signed)
I did the referral over for the arterial studies and also faxed over the paper work. Lattie Haw

## 2018-08-14 NOTE — Telephone Encounter (Signed)
-----   Message from Trula Slade, DPM sent at 08/12/2018 12:44 PM EDT ----- Lattie Haw- can you please order arterial studies? This is for pre-op given arterial calcifications on x-ray. Thanks.

## 2018-08-16 ENCOUNTER — Encounter (INDEPENDENT_AMBULATORY_CARE_PROVIDER_SITE_OTHER): Payer: Self-pay | Admitting: Family Medicine

## 2018-08-17 ENCOUNTER — Other Ambulatory Visit (INDEPENDENT_AMBULATORY_CARE_PROVIDER_SITE_OTHER): Payer: Self-pay

## 2018-08-17 DIAGNOSIS — E119 Type 2 diabetes mellitus without complications: Secondary | ICD-10-CM

## 2018-08-17 DIAGNOSIS — Z794 Long term (current) use of insulin: Secondary | ICD-10-CM

## 2018-08-17 MED ORDER — ONETOUCH VERIO FLEX SYSTEM W/DEVICE KIT: 1.0000 | PACK | Freq: Four times a day (QID) | 0 refills | Status: DC

## 2018-08-17 MED ORDER — ONETOUCH VERIO VI STRP: ORAL_STRIP | 0 refills | Status: DC

## 2018-08-17 MED ORDER — ONETOUCH DELICA LANCETS 33G MISC: 1.0000 | Freq: Every day | 0 refills | Status: DC

## 2018-08-17 NOTE — Telephone Encounter (Signed)
Please advise,  next appt 08/26/2018.

## 2018-08-17 NOTE — Telephone Encounter (Signed)
Ok to send in script.

## 2018-08-18 ENCOUNTER — Inpatient Hospital Stay: Payer: Medicare Other

## 2018-08-18 ENCOUNTER — Telehealth: Payer: Self-pay | Admitting: *Deleted

## 2018-08-18 ENCOUNTER — Other Ambulatory Visit: Payer: Self-pay

## 2018-08-18 VITALS — BP 136/62 | HR 57 | Temp 98.5°F | Resp 16

## 2018-08-18 DIAGNOSIS — Z5112 Encounter for antineoplastic immunotherapy: Secondary | ICD-10-CM | POA: Diagnosis not present

## 2018-08-18 DIAGNOSIS — C50411 Malignant neoplasm of upper-outer quadrant of right female breast: Secondary | ICD-10-CM

## 2018-08-18 DIAGNOSIS — Z95828 Presence of other vascular implants and grafts: Secondary | ICD-10-CM

## 2018-08-18 DIAGNOSIS — C787 Secondary malignant neoplasm of liver and intrahepatic bile duct: Secondary | ICD-10-CM

## 2018-08-18 DIAGNOSIS — C50911 Malignant neoplasm of unspecified site of right female breast: Secondary | ICD-10-CM

## 2018-08-18 DIAGNOSIS — Z171 Estrogen receptor negative status [ER-]: Secondary | ICD-10-CM

## 2018-08-18 DIAGNOSIS — C801 Malignant (primary) neoplasm, unspecified: Secondary | ICD-10-CM

## 2018-08-18 LAB — COMPREHENSIVE METABOLIC PANEL
ALT: 61 U/L — ABNORMAL HIGH (ref 0–44)
AST: 39 U/L (ref 15–41)
Albumin: 3.7 g/dL (ref 3.5–5.0)
Alkaline Phosphatase: 95 U/L (ref 38–126)
Anion gap: 6 (ref 5–15)
BUN: 33 mg/dL — ABNORMAL HIGH (ref 8–23)
CO2: 22 mmol/L (ref 22–32)
Calcium: 8.9 mg/dL (ref 8.9–10.3)
Chloride: 112 mmol/L — ABNORMAL HIGH (ref 98–111)
Creatinine, Ser: 1.7 mg/dL — ABNORMAL HIGH (ref 0.44–1.00)
GFR calc Af Amer: 36 mL/min — ABNORMAL LOW (ref >60)
GFR calc non Af Amer: 31 mL/min — ABNORMAL LOW (ref >60)
Glucose, Bld: 155 mg/dL — ABNORMAL HIGH (ref 70–99)
Potassium: 4 mmol/L (ref 3.5–5.1)
Sodium: 140 mmol/L (ref 135–145)
Total Bilirubin: 0.5 mg/dL (ref 0.3–1.2)
Total Protein: 6.2 g/dL — ABNORMAL LOW (ref 6.5–8.1)

## 2018-08-18 LAB — CBC WITH DIFFERENTIAL/PLATELET
Abs Immature Granulocytes: 0.01 10*3/uL (ref 0.00–0.07)
Basophils Absolute: 0 10*3/uL (ref 0.0–0.1)
Basophils Relative: 0 %
Eosinophils Absolute: 0.1 10*3/uL (ref 0.0–0.5)
Eosinophils Relative: 3 %
HCT: 28.9 % — ABNORMAL LOW (ref 36.0–46.0)
Hemoglobin: 9.3 g/dL — ABNORMAL LOW (ref 12.0–15.0)
Immature Granulocytes: 0 %
Lymphocytes Relative: 27 %
Lymphs Abs: 0.9 10*3/uL (ref 0.7–4.0)
MCH: 31.7 pg (ref 26.0–34.0)
MCHC: 32.2 g/dL (ref 30.0–36.0)
MCV: 98.6 fL (ref 80.0–100.0)
Monocytes Absolute: 0.3 10*3/uL (ref 0.1–1.0)
Monocytes Relative: 10 %
Neutro Abs: 1.9 10*3/uL (ref 1.7–7.7)
Neutrophils Relative %: 60 %
Platelets: 123 10*3/uL — ABNORMAL LOW (ref 150–400)
RBC: 2.93 MIL/uL — ABNORMAL LOW (ref 3.87–5.11)
RDW: 14.4 % (ref 11.5–15.5)
WBC: 3.2 10*3/uL — ABNORMAL LOW (ref 4.0–10.5)
nRBC: 0 % (ref 0.0–0.2)

## 2018-08-18 LAB — CEA (IN HOUSE-CHCC): CEA (CHCC-In House): 1.7 ng/mL (ref 0.00–5.00)

## 2018-08-18 MED ORDER — SODIUM CHLORIDE 0.9% FLUSH
10.0000 mL | INTRAVENOUS | Status: DC | PRN
  Administered 2018-08-18: 10 mL
  Filled 2018-08-18: qty 10

## 2018-08-18 MED ORDER — SODIUM CHLORIDE 0.9% FLUSH
10.0000 mL | INTRAVENOUS | Status: DC | PRN
  Administered 2018-08-18: 10 mL via INTRAVENOUS
  Filled 2018-08-18: qty 10

## 2018-08-18 MED ORDER — PROCHLORPERAZINE MALEATE 10 MG PO TABS
ORAL_TABLET | ORAL | Status: AC
  Filled 2018-08-18: qty 1

## 2018-08-18 MED ORDER — ACETAMINOPHEN 325 MG PO TABS
ORAL_TABLET | ORAL | Status: AC
  Filled 2018-08-18: qty 2

## 2018-08-18 MED ORDER — DIPHENHYDRAMINE HCL 25 MG PO CAPS
25.0000 mg | ORAL_CAPSULE | Freq: Once | ORAL | Status: AC
  Administered 2018-08-18: 25 mg via ORAL

## 2018-08-18 MED ORDER — PROCHLORPERAZINE MALEATE 10 MG PO TABS
10.0000 mg | ORAL_TABLET | Freq: Once | ORAL | Status: AC
  Administered 2018-08-18: 10 mg via ORAL

## 2018-08-18 MED ORDER — SODIUM CHLORIDE 0.9 % IV SOLN
Freq: Once | INTRAVENOUS | Status: AC
  Administered 2018-08-18: 10:00:00 via INTRAVENOUS
  Filled 2018-08-18: qty 250

## 2018-08-18 MED ORDER — HEPARIN SOD (PORK) LOCK FLUSH 100 UNIT/ML IV SOLN
500.0000 [IU] | Freq: Once | INTRAVENOUS | Status: AC | PRN
  Administered 2018-08-18: 500 [IU]
  Filled 2018-08-18: qty 5

## 2018-08-18 MED ORDER — DIPHENHYDRAMINE HCL 25 MG PO CAPS
ORAL_CAPSULE | ORAL | Status: AC
  Filled 2018-08-18: qty 2

## 2018-08-18 MED ORDER — ACETAMINOPHEN 325 MG PO TABS
650.0000 mg | ORAL_TABLET | Freq: Once | ORAL | Status: AC
  Administered 2018-08-18: 650 mg via ORAL

## 2018-08-18 MED ORDER — PACLITAXEL PROTEIN-BOUND CHEMO INJECTION 100 MG
80.0000 mg/m2 | Freq: Once | INTRAVENOUS | Status: AC
  Administered 2018-08-18: 175 mg via INTRAVENOUS
  Filled 2018-08-18: qty 35

## 2018-08-18 MED ORDER — TRASTUZUMAB-ANNS CHEMO 420 MG IV SOLR
4.0000 mg/kg | Freq: Once | INTRAVENOUS | Status: AC
  Administered 2018-08-18: 357 mg via INTRAVENOUS
  Filled 2018-08-18: qty 17

## 2018-08-18 NOTE — Telephone Encounter (Signed)
May proceed with treatment today per MD review of creatinine of 1.7 .

## 2018-08-18 NOTE — Patient Instructions (Signed)
[facility] Discharge Instructions for Patients Receiving Chemotherapy  Today you received the following chemotherapy agents: Kanjinti/Abraxane.  To help prevent nausea and vomiting after your treatment, we encourage you to take your nausea medication as prescribed.   If you develop nausea and vomiting that is not controlled by your nausea medication, call the clinic.   BELOW ARE SYMPTOMS THAT SHOULD BE REPORTED IMMEDIATELY:  *FEVER GREATER THAN 100.5 F  *CHILLS WITH OR WITHOUT FEVER  NAUSEA AND VOMITING THAT IS NOT CONTROLLED WITH YOUR NAUSEA MEDICATION  *UNUSUAL SHORTNESS OF BREATH  *UNUSUAL BRUISING OR BLEEDING  TENDERNESS IN MOUTH AND THROAT WITH OR WITHOUT PRESENCE OF ULCERS  *URINARY PROBLEMS  *BOWEL PROBLEMS  UNUSUAL RASH Items with * indicate a potential emergency and should be followed up as soon as possible.  Feel free to call the clinic should you have any questions or concerns. The clinic phone number is [phone].  Please show the [facility] at check-in to the Emergency Department and triage nurse.

## 2018-08-26 ENCOUNTER — Telehealth (INDEPENDENT_AMBULATORY_CARE_PROVIDER_SITE_OTHER): Payer: Medicare Other | Admitting: Family Medicine

## 2018-08-26 ENCOUNTER — Other Ambulatory Visit: Payer: Self-pay

## 2018-08-26 ENCOUNTER — Encounter (INDEPENDENT_AMBULATORY_CARE_PROVIDER_SITE_OTHER): Payer: Self-pay | Admitting: Family Medicine

## 2018-08-26 DIAGNOSIS — E559 Vitamin D deficiency, unspecified: Secondary | ICD-10-CM | POA: Diagnosis not present

## 2018-08-26 DIAGNOSIS — F3289 Other specified depressive episodes: Secondary | ICD-10-CM | POA: Diagnosis not present

## 2018-08-26 DIAGNOSIS — E782 Mixed hyperlipidemia: Secondary | ICD-10-CM

## 2018-08-26 DIAGNOSIS — I10 Essential (primary) hypertension: Secondary | ICD-10-CM | POA: Diagnosis not present

## 2018-08-26 DIAGNOSIS — Z6830 Body mass index (BMI) 30.0-30.9, adult: Secondary | ICD-10-CM

## 2018-08-26 DIAGNOSIS — Z794 Long term (current) use of insulin: Secondary | ICD-10-CM

## 2018-08-26 DIAGNOSIS — E669 Obesity, unspecified: Secondary | ICD-10-CM

## 2018-08-26 DIAGNOSIS — E1140 Type 2 diabetes mellitus with diabetic neuropathy, unspecified: Secondary | ICD-10-CM | POA: Diagnosis not present

## 2018-08-26 DIAGNOSIS — E538 Deficiency of other specified B group vitamins: Secondary | ICD-10-CM

## 2018-08-26 MED ORDER — LOSARTAN POTASSIUM 100 MG PO TABS: 100.0000 mg | ORAL_TABLET | Freq: Every day | ORAL | 0 refills | Status: DC

## 2018-08-26 MED ORDER — VICTOZA 18 MG/3ML ~~LOC~~ SOPN: PEN_INJECTOR | SUBCUTANEOUS | 0 refills | Status: DC

## 2018-08-26 MED ORDER — LANTUS SOLOSTAR 100 UNIT/ML ~~LOC~~ SOPN: 12.0000 [IU] | PEN_INJECTOR | Freq: Every day | SUBCUTANEOUS | 0 refills | Status: DC

## 2018-08-26 MED ORDER — TOPIRAMATE 25 MG PO TABS: 25.0000 mg | ORAL_TABLET | Freq: Every day | ORAL | 0 refills | Status: DC

## 2018-08-26 MED ORDER — VORTIOXETINE HBR 20 MG PO TABS: 20.0000 mg | ORAL_TABLET | Freq: Every day | ORAL | 0 refills | Status: DC

## 2018-08-26 MED ORDER — BUPROPION HCL ER (SR) 200 MG PO TB12: ORAL_TABLET | ORAL | 0 refills | Status: DC

## 2018-08-27 NOTE — Progress Notes (Signed)
Office: [phone]  /  Fax: [phone] TeleHealth Visit:  Darlene [name] has verbally consented to this TeleHealth visit today. The patient is located at home, the provider is located at the [facility] office. The participants in this visit include the listed provider and patient. Darlene [name] was unable to use realtime audiovisual technology today and the telehealth visit was conducted via telephone.   HPI:   Chief Complaint: OBESITY Darlene [name] is here to discuss her progress with her obesity treatment plan. She is on the keep a food journal with 1300-1500 calories and 80+ grams of protein daily and is following her eating plan approximately 80 % of the time. She states she is exercising 0 minutes 0 times per week. Darlene [name] feels she is doing well maintaining her weight. She has moved to a new home where the neighbors are very social, and often drink wine with each other in the evenings. She has been indulging more and her protein has decreased. We were unable to weigh the patient today for this TeleHealth visit. She feels as if she has maintained her weight since her last visit. She has lost 30 lbs since starting treatment with [name].  Diabetes II Darlene [name] has a diagnosis of diabetes type II. Darlene [name] notes some fluctuations in blood sugars with lows in the 60's and highs in the 190's. Last A1c was 5.8. She has been working on intensive lifestyle modifications including diet, exercise, and weight loss to help control her blood glucose levels.  Hypertension Darlene [name] is a 68 y.o. female with hypertension. Darlene [name]'s blood pressure is stable on losartan. She denies chest pain, headaches, or dizziness. She is working on weight loss to help control her blood pressure with the goal of decreasing her risk of heart attack and stroke.   Hyperlipidemia Darlene [name] has hyperlipidemia and has been attempting to improve her cholesterol levels with intensive lifestyle modification  including a low saturated fat diet, exercise and weight loss. She denies any chest pain, claudication or myalgias.  Vitamin D Deficiency Darlene [name] has a diagnosis of vitamin D deficiency. She is stable on OTC Vit D and denies nausea, vomiting or muscle weakness. She is due for labs.  Vitamin B12 Deficiency Darlene [name] has a diagnosis of B12 insufficiency. She is on metformin 500 mg qd. Her B12 has not been checked recent. She is at a higher risk of B12 deficiency. She is not a vegetarian and does not have a previous diagnosis of pernicious anemia. She does not have a history of weight loss surgery.   Depression with Emotional Eating Behaviors Darlene [name]'s mood is stable on medications and she denies insomnia or daytime somnolence. Darlene [name] struggles with emotional eating and using food for comfort to the extent that it is negatively impacting her health. She often snacks when she is not hungry. Darlene [name] sometimes feels she is out of control and then feels guilty that she made poor food choices. She has been working on behavior modification techniques to help reduce her emotional eating and has been somewhat successful. She shows no sign of suicidal or homicidal ideations.  ASSESSMENT AND PLAN:  Vitamin D deficiency - Plan: VITAMIN D 25 Hydroxy (Vit-D Deficiency, Fractures)  Other depression - with emotional eating - Plan: buPROPion (WELLBUTRIN SR) 200 MG 12 hr tablet, topiramate (TOPAMAX) 25 MG tablet, vortioxetine HBr (TRINTELLIX) 20 MG TABS tablet  Type 2 diabetes mellitus with diabetic neuropathy, with long-term current use of insulin (HCC) - Plan: Insulin Glargine (LANTUS SOLOSTAR) 100 UNIT/ML Solostar Pen,  liraglutide (VICTOZA) 18 MG/3ML SOPN, Hemoglobin A1c  Essential hypertension - Plan: losartan (COZAAR) 100 MG tablet  B12 deficiency - Plan: Vitamin B12  Mixed hyperlipidemia - Plan: Lipid Panel With LDL/HDL Ratio  Class 1 obesity with serious comorbidity and body mass index (BMI) of 30.0  to 30.9 in adult, unspecified obesity type  PLAN:  Diabetes II Darlene [name] has been given extensive diabetes education by myself today including ideal fasting and post-prandial blood glucose readings, individual ideal Hgb A1c goals and hypoglycemia prevention. We discussed the importance of good blood sugar control to decrease the likelihood of diabetic complications such as nephropathy, neuropathy, limb loss, blindness, coronary artery disease, and death. We discussed the importance of intensive lifestyle modification including diet, exercise and weight loss as the first line treatment for diabetes. Darlene [name] agrees to continue Victoza 1.8 mg SubQ q AM #3 pens and we will refill for 1 month; she agrees to continue Lantus 12 units SubQ daily #5 pens and we will refill for 1 month. We will check labs today. Darlene [name] agrees to follow up with our clinic in 2 to 3 weeks.  Hypertension We discussed sodium restriction, working on healthy weight loss, and a regular exercise program as the means to achieve improved blood pressure control. Darlene [name] agreed with this plan and agreed to follow up as directed. We will continue to monitor her blood pressure as well as her progress with the above lifestyle modifications. Darlene [name] agrees to continue taking losartan 100 mg q daily #30 and we will refill for 1 month. She will watch for signs of hypotension as she continues her lifestyle modifications. Darlene [name] agrees to follow up with our clinic in 2 to 3 weeks.  Hyperlipidemia Darlene [name] was informed of the American Heart Association Guidelines emphasizing intensive lifestyle modifications as the first line treatment for hyperlipidemia. We discussed many lifestyle modifications today in depth, and Darlene [name] will continue to work on decreasing saturated fats such as fatty red meat, butter and many fried foods. She will also increase vegetables and lean protein in her diet and continue to work on diet, exercise, and weight loss  efforts. We will check labs today. Darlene [name] agrees to follow up with our clinic in 2 to 3 weeks.  Vitamin D Deficiency Darlene [name] was informed that low vitamin D levels contributes to fatigue and are associated with obesity, breast, and colon cancer. Darlene [name] agrees to continue taking OTC Vit D and will follow up for routine testing of vitamin D, at least 2-3 times per year. She was informed of the risk of over-replacement of vitamin D and agrees to not increase her dose unless she discusses this with [name] first. We will check labs today. Darlene [name] agrees to follow up with our clinic in 2 to 3 weeks.  Vitamin B12 Deficiency Darlene [name] will work on increasing B12 rich foods in her diet. B12 supplementation was not prescribed today. We will check labs today.  Depression with Emotional Eating Behaviors We discussed behavior modification techniques today to help Darlene [name] deal with her emotional eating and depression. Darlene [name] agrees to continue taking Trintellix 20 mg qhs #30 and we will refill for 1 month; she agrees to continue taking Topamax 25 mg q daily #30 and we will refill for 1 month. Phoenyx agrees to follow up with our clinic in 2 to 3 weeks.  I spent > than 50% of the 25 minute visit on counseling as documented in the note.  Obesity Darlene [name] is currently in the action stage of change. As such, her  goal is to continue with weight loss efforts She has agreed to keep a food journal with 1300-1500 calories and 85+ grams of protein daily Trudee has been instructed to work up to a goal of 150 minutes of combined cardio and strengthening exercise per week for weight loss and overall health benefits. We discussed the following Behavioral Modification Strategies today: decrease ETOH, decrease liquid calories, and keep a strict food journal Raela was encouraged to make a "light" wine cocktail with 1/3 white wine and 2/3 diet Sprite to help decrease calories, but still be social.  Chezney has  agreed to follow up with our clinic in 2 to 3 weeks. She was informed of the importance of frequent follow up visits to maximize her success with intensive lifestyle modifications for her multiple health conditions.  ALLERGIES: Allergies  Allergen Reactions  . Nsaids Other (See Comments)    H/o gastric bypass - avoid NSAIDs  . Tape Hives, Rash and Other (See Comments)    Adhesives in bandaids    MEDICATIONS: Current Outpatient Medications on File Prior to Visit  Medication Sig Dispense Refill  . acetaminophen (TYLENOL) 325 MG tablet Take 650 mg by mouth 2 (two) times daily as needed for moderate pain or headache.     . Blood Glucose Monitoring Suppl (ONETOUCH VERIO FLEX SYSTEM) w/Device KIT 1 kit by Does not apply route 4 (four) times daily. Test blood sugars four times daily 1 kit 0  . CALCIUM CITRATE-VITAMIN D PO Take 1 tablet by mouth 3 (three) times daily.     . cholecalciferol (VITAMIN D) 1000 units tablet Take 1,000 Units by mouth daily.    . Cyanocobalamin (VITAMIN B-12 PO) Take 1 tablet by mouth every 14 (fourteen) days.    . diphenhydrAMINE (BENADRYL) 25 MG tablet Take 25 mg by mouth daily as needed for allergies or sleep.     . DUREZOL 0.05 % EMUL     . glucose blood (ONETOUCH VERIO) test strip Use as instructed 100 each 0  . Insulin Pen Needle (BD PEN NEEDLE NANO U/F) 32G X 4 MM MISC 1 Package by Does not apply route daily at 12 noon. 100 each 0  . loratadine (CLARITIN) 10 MG tablet Take 10 mg by mouth as needed for allergies.    . metFORMIN (GLUCOPHAGE) 500 MG tablet Take 1 tablet (500 mg total) by mouth daily. 30 tablet 0  . ofloxacin (OCUFLOX) 0.3 % ophthalmic solution     . OneTouch Delica Lancets 67H MISC 1 Package by Does not apply route daily. 100 each 0  . Pediatric Multivitamins-Iron (FLINTSTONES PLUS IRON) chewable tablet Chew 1 tablet by mouth 2 (two) times daily.    . Probiotic Product ([employer] PROBIOTIC PO) Take 1 capsule by mouth daily.     . prochlorperazine  (COMPAZINE) 10 MG tablet Take 1 tablet (10 mg total) by mouth every 6 (six) hours as needed for nausea or vomiting. 30 tablet 0  . Propylene Glycol (SYSTANE BALANCE OP) Place 1-2 drops into both eyes 3 (three) times daily as needed (for dry eyes).     [name] senna-docusate (SENNA PLUS) 8.6-50 MG tablet Take 1 tablet by mouth daily as needed for moderate constipation.     [name] tobramycin (TOBREX) 0.3 % ophthalmic solution      No current facility-administered medications on file prior to visit.     PAST MEDICAL HISTORY: Past Medical History:  Diagnosis Date  . Anemia    hx of - during 1st round chemo  .  Anxiety   . Arthritis    in fingers, shoulders  . Back pain   . Balance problem   . Breast cancer ([place])   . Breast cancer of upper-outer quadrant of right female breast ([place]) 07/22/2014  . Bunion, right foot   . Clumsiness   . Constipation   . Depression   . Diabetes mellitus without complication ([place])    46+ years  . Diabetic retinopathy ([place])    torn retina  . Eating disorder    binge eating  . Epistaxis 08/26/2014  . Fatty liver   . HCAP (healthcare-associated pneumonia) 12/18/2014  . Headache   . Heart murmur    never had any problems  . History of radiation therapy 04/05/15-05/19/15   right chest wall was treated to 50.4 Gy in 28 fractions, right mastectomy scar was treated to 10 Gy in 5 fractions  . Hyperlipidemia   . Hypertension   . Joint pain   . Multiple food allergies   . Neuromuscular disorder ([place])    diabetic neuropathy  . Obesity   . Obstructive sleep apnea on CPAP    uses cpap nightly  . Ovarian cyst rupture    possible  . Pneumonia   . Pneumonia 11/2014  . Radiation 04/05/15-05/19/15   right chest wall 50.4 Gy, mastectomy scar 10 Gy  . Sleep apnea   . Thyroid nodule 06/2014    PAST SURGICAL HISTORY: Past Surgical History:  Procedure Laterality Date  . APPENDECTOMY    . BIOPSY THYROID Bilateral 06/2014   2 nodules - Benign   . BREAST LUMPECTOMY WITH  RADIOACTIVE SEED AND SENTINEL LYMPH NODE BIOPSY Right 12/27/2014   Procedure: BREAST LUMPECTOMY WITH RADIOACTIVE SEED AND SENTINEL LYMPH NODE BIOPSY;  Surgeon: Stark Klein, MD;  Location: [facility];  Service: General;  Laterality: Right;  . BREAST REDUCTION SURGERY Bilateral 01/06/2015   Procedure: BILATERAL BREAST REDUCTION, RIGHT ONCOPLASTIC RECONSTRUCTION),LEFT BREAST REDUCTION FOR SYMMETRY;  Surgeon: [name], MD;  Location: [facility];  Service: Plastics;  Laterality: Bilateral;  . BREATH TEK H PYLORI N/A 09/15/2012   Procedure: BREATH TEK H PYLORI;  Surgeon: Pedro Earls, MD;  Location: [facility];  Service: General;  Laterality: N/A;  . BUNIONECTOMY Left 12/2013  . CARPAL TUNNEL RELEASE Right   . CARPAL TUNNEL RELEASE Left   . CATARACT EXTRACTION     6/19  . COLONOSCOPY    . DUPUYTREN CONTRACTURE RELEASE    . GASTRIC ROUX-EN-Y N/A 11/02/2012   Procedure: LAPAROSCOPIC ROUX-EN-Y GASTRIC;  Surgeon: Pedro Earls, MD;  Location: [facility];  Service: General;  Laterality: N/A;  . IR GENERIC HISTORICAL  03/18/2016   IR US GUIDE VASC ACCESS LEFT 03/18/2016 Markus Daft, MD [facility]  . IR GENERIC HISTORICAL  03/18/2016   IR FLUORO GUIDE CV LINE LEFT 03/18/2016 Markus Daft, MD [facility]  . LAPAROSCOPIC APPENDECTOMY N/A 07/22/2015   Procedure: APPENDECTOMY LAPAROSCOPIC;  Surgeon: Michael Boston, MD;  Location: [facility];  Service: General;  Laterality: N/A;  . MASTECTOMY Right 02/15/2015   modified  . MODIFIED MASTECTOMY Right 02/15/2015   Procedure: RIGHT MODIFIED RADICAL MASTECTOMY;  Surgeon: Stark Klein, MD;  Location: [facility];  Service: General;  Laterality: Right;  . PORT-A-CATH REMOVAL Left 10/10/2015   Procedure: PORT REMOVAL;  Surgeon: Stark Klein, MD;  Location: [facility];  Service: General;  Laterality: Left;  . PORTACATH PLACEMENT Left 08/02/2014   Procedure: INSERTION PORT-A-CATH;  Surgeon: Stark Klein, MD;  Location: [facility];  Service: General;   Laterality: Left;  . PORTACATH PLACEMENT N/A 11/05/2016   Procedure: INSERTION PORT-A-CATH;  Surgeon: Stark Klein, MD;  Location: [facility];  Service: General;  Laterality: N/A;  . RETINAL LASER PROCEDURE     retina tear on left eye  . ROTATOR CUFF REPAIR Right 2005  . SCAR REVISION Right 12/08/2015   Procedure: COMPLEX REPAIR RIGHT CHEST 10-15CM;  Surgeon: [name], MD;  Location: [facility];  Service: Plastics;  Laterality: Right;  . TOE SURGERY  1970s   bone spur  . TRIGGER FINGER RELEASE     x3    SOCIAL HISTORY: Social History   Tobacco Use  . Smoking status: Never Smoker  . Smokeless tobacco: Never Used  Substance Use Topics  . Alcohol use: Yes    Alcohol/week: 1.0 - 2.0 standard drinks    Types: 1 - 2 Standard drinks or equivalent per week    Comment: social  . Drug use: No    FAMILY HISTORY: Family History  Problem Relation Age of Onset  . CVA Mother   . Heart disease Mother   . Sudden death Mother   . Diabetes Father   . Heart failure Father   . Hypertension Father   . Kidney disease Father   . Obesity Father   . Hypertension Sister   . Diabetes Brother   . Breast cancer Paternal Grandmother 61  . Diabetes Paternal Uncle   . Ovarian cancer Neg Hx     ROS: Review of Systems  Constitutional: Negative for weight loss.  Cardiovascular: Negative for chest pain and claudication.  Gastrointestinal: Negative for nausea and vomiting.  Musculoskeletal: Negative for myalgias.       Negative muscle weakness  Neurological: Negative for dizziness and headaches.  Psychiatric/Behavioral: Positive for depression. Negative for suicidal ideas.    PHYSICAL EXAM: Pt in no acute distress  RECENT LABS AND TESTS: BMET    Component Value Date/Time   NA 140 08/18/2018 0819   NA 143 03/11/2017 0841   NA 140 01/07/2017 0758   K 4.0 08/18/2018 0819   K 4.1 01/07/2017 0758   CL 112 (H) 08/18/2018 0819   CO2 22 08/18/2018 0819   CO2 24 01/07/2017  0758   GLUCOSE 155 (H) 08/18/2018 0819   GLUCOSE 134 01/07/2017 0758   BUN 33 (H) 08/18/2018 0819   BUN 40 (H) 03/11/2017 0841   BUN 33.6 (H) 01/07/2017 0758   CREATININE 1.70 (H) 08/18/2018 0819   CREATININE 1.62 (H) 04/28/2018 0915   CREATININE 1.5 (H) 01/07/2017 0758   CALCIUM 8.9 08/18/2018 0819   CALCIUM 9.3 01/07/2017 0758   GFRNONAA 31 (L) 08/18/2018 0819   GFRNONAA 33 (L) 04/28/2018 0915   GFRNONAA 55 (L) 02/18/2013 0827   GFRAA 36 (L) 08/18/2018 0819   GFRAA 38 (L) 04/28/2018 0915   GFRAA 63 02/18/2013 0827   Lab Results  Component Value Date   HGBA1C 5.8 (H) 09/03/2017   HGBA1C 6.1 (H) 03/11/2017   HGBA1C 8.2 (H) 11/05/2016   HGBA1C 8.1 (H) 08/09/2016   HGBA1C 6.5 (H) 01/03/2015   Lab Results  Component Value Date   INSULIN 15.3 03/11/2017   INSULIN 18.3 08/09/2016   CBC    Component Value Date/Time   WBC 3.2 (L) 08/18/2018 0819   RBC 2.93 (L) 08/18/2018 0819   HGB 9.3 (L) 08/18/2018 0819   HGB 10.6 (L) 12/16/2017 0806   HGB 10.8 (L) 01/07/2017 0758   HCT 28.9 (L) 08/18/2018 5188  HCT 31.9 (L) 01/07/2017 0758   PLT 123 (L) 08/18/2018 0819   PLT 83 (L) 12/16/2017 0806   PLT 126 (L) 01/07/2017 0758   MCV 98.6 08/18/2018 0819   MCV 90.8 01/07/2017 0758   MCH 31.7 08/18/2018 0819   MCHC 32.2 08/18/2018 0819   RDW 14.4 08/18/2018 0819   RDW 14.8 (H) 01/07/2017 0758   LYMPHSABS 0.9 08/18/2018 0819   LYMPHSABS 1.1 01/07/2017 0758   MONOABS 0.3 08/18/2018 0819   MONOABS 0.3 01/07/2017 0758   EOSABS 0.1 08/18/2018 0819   EOSABS 0.1 01/07/2017 0758   EOSABS 0.1 08/09/2016 1229   BASOSABS 0.0 08/18/2018 0819   BASOSABS 0.0 01/07/2017 0758   Iron/TIBC/Ferritin/ %Sat    Component Value Date/Time   IRON 58 02/18/2013 0827   TIBC 305 02/18/2013 0827   FERRITIN 133 10/31/2014 0816   IRONPCTSAT 19 (L) 02/18/2013 0827   Lipid Panel     Component Value Date/Time   CHOL 160 09/03/2017 1349   TRIG 111 09/03/2017 1349   HDL 59 09/03/2017 1349   CHOLHDL  2.9 02/18/2013 0827   VLDL 23 02/18/2013 0827   LDLCALC 79 09/03/2017 1349   Hepatic Function Panel     Component Value Date/Time   PROT 6.2 (L) 08/18/2018 0819   PROT 6.9 03/11/2017 0841   PROT 6.7 01/07/2017 0758   ALBUMIN 3.7 08/18/2018 0819   ALBUMIN 4.5 03/11/2017 0841   ALBUMIN 3.6 01/07/2017 0758   AST 39 08/18/2018 0819   AST 43 (H) 04/28/2018 0915   AST 51 (H) 01/07/2017 0758   ALT 61 (H) 08/18/2018 0819   ALT 61 (H) 04/28/2018 0915   ALT 82 (H) 01/07/2017 0758   ALKPHOS 95 08/18/2018 0819   ALKPHOS 115 01/07/2017 0758   BILITOT 0.5 08/18/2018 0819   BILITOT 0.6 04/28/2018 0915   BILITOT 0.72 01/07/2017 0758      Component Value Date/Time   TSH 1.54 02/07/2017   TSH 2.170 08/09/2016 1229      I, [name], am acting as transcriptionist for Dennard Nip, MD I have reviewed the above documentation for accuracy and completeness, and I agree with the above. -Dennard Nip, MD

## 2018-08-28 LAB — HEMOGLOBIN A1C
Est. average glucose Bld gHb Est-mCnc: 108 mg/dL
Hgb A1c MFr Bld: 5.4 % (ref 4.8–5.6)

## 2018-08-28 LAB — VITAMIN B12: Vitamin B-12: 1039 pg/mL (ref 232–1245)

## 2018-08-28 LAB — LIPID PANEL WITH LDL/HDL RATIO
Cholesterol, Total: 137 mg/dL (ref 100–199)
HDL: 73 mg/dL (ref >39)
LDL Calculated: 53 mg/dL (ref 0–99)
LDl/HDL Ratio: 0.7 ratio (ref 0.0–3.2)
Triglycerides: 56 mg/dL (ref 0–149)
VLDL Cholesterol Cal: 11 mg/dL (ref 5–40)

## 2018-08-28 LAB — VITAMIN D 25 HYDROXY (VIT D DEFICIENCY, FRACTURES): Vit D, 25-Hydroxy: 43.5 ng/mL (ref 30.0–100.0)

## 2018-09-01 ENCOUNTER — Inpatient Hospital Stay: Payer: Medicare Other

## 2018-09-01 ENCOUNTER — Inpatient Hospital Stay: Payer: Medicare Other | Attending: Oncology

## 2018-09-01 ENCOUNTER — Other Ambulatory Visit: Payer: Self-pay

## 2018-09-01 VITALS — BP 129/61 | HR 59 | Temp 98.0°F | Resp 16 | Wt 195.2 lb

## 2018-09-01 DIAGNOSIS — Z7984 Long term (current) use of oral hypoglycemic drugs: Secondary | ICD-10-CM | POA: Insufficient documentation

## 2018-09-01 DIAGNOSIS — C787 Secondary malignant neoplasm of liver and intrahepatic bile duct: Secondary | ICD-10-CM

## 2018-09-01 DIAGNOSIS — I10 Essential (primary) hypertension: Secondary | ICD-10-CM | POA: Insufficient documentation

## 2018-09-01 DIAGNOSIS — C50411 Malignant neoplasm of upper-outer quadrant of right female breast: Secondary | ICD-10-CM | POA: Diagnosis present

## 2018-09-01 DIAGNOSIS — C801 Malignant (primary) neoplasm, unspecified: Secondary | ICD-10-CM

## 2018-09-01 DIAGNOSIS — F329 Major depressive disorder, single episode, unspecified: Secondary | ICD-10-CM | POA: Diagnosis not present

## 2018-09-01 DIAGNOSIS — Z5111 Encounter for antineoplastic chemotherapy: Secondary | ICD-10-CM | POA: Insufficient documentation

## 2018-09-01 DIAGNOSIS — Z9011 Acquired absence of right breast and nipple: Secondary | ICD-10-CM | POA: Diagnosis not present

## 2018-09-01 DIAGNOSIS — Z171 Estrogen receptor negative status [ER-]: Secondary | ICD-10-CM | POA: Insufficient documentation

## 2018-09-01 DIAGNOSIS — Z794 Long term (current) use of insulin: Secondary | ICD-10-CM | POA: Diagnosis not present

## 2018-09-01 DIAGNOSIS — Z79899 Other long term (current) drug therapy: Secondary | ICD-10-CM | POA: Diagnosis not present

## 2018-09-01 DIAGNOSIS — Z95828 Presence of other vascular implants and grafts: Secondary | ICD-10-CM

## 2018-09-01 DIAGNOSIS — Z923 Personal history of irradiation: Secondary | ICD-10-CM | POA: Diagnosis not present

## 2018-09-01 DIAGNOSIS — E785 Hyperlipidemia, unspecified: Secondary | ICD-10-CM | POA: Insufficient documentation

## 2018-09-01 DIAGNOSIS — Z5112 Encounter for antineoplastic immunotherapy: Secondary | ICD-10-CM | POA: Diagnosis not present

## 2018-09-01 DIAGNOSIS — C50911 Malignant neoplasm of unspecified site of right female breast: Secondary | ICD-10-CM

## 2018-09-01 DIAGNOSIS — Z800 Family history of malignant neoplasm of digestive organs: Secondary | ICD-10-CM | POA: Diagnosis not present

## 2018-09-01 DIAGNOSIS — E119 Type 2 diabetes mellitus without complications: Secondary | ICD-10-CM | POA: Diagnosis not present

## 2018-09-01 LAB — CBC WITH DIFFERENTIAL/PLATELET
Abs Immature Granulocytes: 0.02 10*3/uL (ref 0.00–0.07)
Basophils Absolute: 0 10*3/uL (ref 0.0–0.1)
Basophils Relative: 0 %
Eosinophils Absolute: 0.1 10*3/uL (ref 0.0–0.5)
Eosinophils Relative: 2 %
HCT: 29.9 % — ABNORMAL LOW (ref 36.0–46.0)
Hemoglobin: 9.8 g/dL — ABNORMAL LOW (ref 12.0–15.0)
Immature Granulocytes: 1 %
Lymphocytes Relative: 24 %
Lymphs Abs: 0.9 10*3/uL (ref 0.7–4.0)
MCH: 31.4 pg (ref 26.0–34.0)
MCHC: 32.8 g/dL (ref 30.0–36.0)
MCV: 95.8 fL (ref 80.0–100.0)
Monocytes Absolute: 0.4 10*3/uL (ref 0.1–1.0)
Monocytes Relative: 10 %
Neutro Abs: 2.4 10*3/uL (ref 1.7–7.7)
Neutrophils Relative %: 63 %
Platelets: 134 10*3/uL — ABNORMAL LOW (ref 150–400)
RBC: 3.12 MIL/uL — ABNORMAL LOW (ref 3.87–5.11)
RDW: 14.5 % (ref 11.5–15.5)
WBC: 3.8 10*3/uL — ABNORMAL LOW (ref 4.0–10.5)
nRBC: 0 % (ref 0.0–0.2)

## 2018-09-01 LAB — COMPREHENSIVE METABOLIC PANEL
ALT: 58 U/L — ABNORMAL HIGH (ref 0–44)
AST: 43 U/L — ABNORMAL HIGH (ref 15–41)
Albumin: 4 g/dL (ref 3.5–5.0)
Alkaline Phosphatase: 92 U/L (ref 38–126)
Anion gap: 7 (ref 5–15)
BUN: 43 mg/dL — ABNORMAL HIGH (ref 8–23)
CO2: 23 mmol/L (ref 22–32)
Calcium: 9.3 mg/dL (ref 8.9–10.3)
Chloride: 112 mmol/L — ABNORMAL HIGH (ref 98–111)
Creatinine, Ser: 1.76 mg/dL — ABNORMAL HIGH (ref 0.44–1.00)
GFR calc Af Amer: 35 mL/min — ABNORMAL LOW (ref >60)
GFR calc non Af Amer: 30 mL/min — ABNORMAL LOW (ref >60)
Glucose, Bld: 106 mg/dL — ABNORMAL HIGH (ref 70–99)
Potassium: 4.3 mmol/L (ref 3.5–5.1)
Sodium: 142 mmol/L (ref 135–145)
Total Bilirubin: 0.8 mg/dL (ref 0.3–1.2)
Total Protein: 6.9 g/dL (ref 6.5–8.1)

## 2018-09-01 MED ORDER — PACLITAXEL PROTEIN-BOUND CHEMO INJECTION 100 MG
80.0000 mg/m2 | Freq: Once | INTRAVENOUS | Status: AC
  Administered 2018-09-01: 12:00:00 175 mg via INTRAVENOUS
  Filled 2018-09-01: qty 35

## 2018-09-01 MED ORDER — SODIUM CHLORIDE 0.9% FLUSH
10.0000 mL | INTRAVENOUS | Status: DC | PRN
  Administered 2018-09-01: 10 mL via INTRAVENOUS
  Filled 2018-09-01: qty 10

## 2018-09-01 MED ORDER — SODIUM CHLORIDE 0.9% FLUSH
10.0000 mL | INTRAVENOUS | Status: DC | PRN
  Administered 2018-09-01: 10 mL
  Filled 2018-09-01: qty 10

## 2018-09-01 MED ORDER — DIPHENHYDRAMINE HCL 25 MG PO CAPS
25.0000 mg | ORAL_CAPSULE | Freq: Once | ORAL | Status: AC
  Administered 2018-09-01: 25 mg via ORAL

## 2018-09-01 MED ORDER — ACETAMINOPHEN 325 MG PO TABS
ORAL_TABLET | ORAL | Status: AC
  Filled 2018-09-01: qty 2

## 2018-09-01 MED ORDER — ACETAMINOPHEN 325 MG PO TABS
650.0000 mg | ORAL_TABLET | Freq: Once | ORAL | Status: AC
  Administered 2018-09-01: 650 mg via ORAL

## 2018-09-01 MED ORDER — SODIUM CHLORIDE 0.9 % IV SOLN
Freq: Once | INTRAVENOUS | Status: AC
  Administered 2018-09-01: 10:00:00 via INTRAVENOUS
  Filled 2018-09-01: qty 250

## 2018-09-01 MED ORDER — TRASTUZUMAB-ANNS CHEMO 420 MG IV SOLR
4.0000 mg/kg | Freq: Once | INTRAVENOUS | Status: AC
  Administered 2018-09-01: 357 mg via INTRAVENOUS
  Filled 2018-09-01: qty 17

## 2018-09-01 MED ORDER — DIPHENHYDRAMINE HCL 25 MG PO CAPS
ORAL_CAPSULE | ORAL | Status: AC
  Filled 2018-09-01: qty 1

## 2018-09-01 MED ORDER — PROCHLORPERAZINE MALEATE 10 MG PO TABS
10.0000 mg | ORAL_TABLET | Freq: Once | ORAL | Status: AC
  Administered 2018-09-01: 10 mg via ORAL

## 2018-09-01 MED ORDER — HEPARIN SOD (PORK) LOCK FLUSH 100 UNIT/ML IV SOLN
500.0000 [IU] | Freq: Once | INTRAVENOUS | Status: AC | PRN
  Administered 2018-09-01: 500 [IU]
  Filled 2018-09-01: qty 5

## 2018-09-01 MED ORDER — PROCHLORPERAZINE MALEATE 10 MG PO TABS
ORAL_TABLET | ORAL | Status: AC
  Filled 2018-09-01: qty 1

## 2018-09-01 NOTE — Patient Instructions (Signed)
[facility] Discharge Instructions for Patients Receiving Chemotherapy  Today you received the following chemotherapy agents: Kanjinti/Abraxane.  To help prevent nausea and vomiting after your treatment, we encourage you to take your nausea medication as prescribed.   If you develop nausea and vomiting that is not controlled by your nausea medication, call the clinic.   BELOW ARE SYMPTOMS THAT SHOULD BE REPORTED IMMEDIATELY:  *FEVER GREATER THAN 100.5 F  *CHILLS WITH OR WITHOUT FEVER  NAUSEA AND VOMITING THAT IS NOT CONTROLLED WITH YOUR NAUSEA MEDICATION  *UNUSUAL SHORTNESS OF BREATH  *UNUSUAL BRUISING OR BLEEDING  TENDERNESS IN MOUTH AND THROAT WITH OR WITHOUT PRESENCE OF ULCERS  *URINARY PROBLEMS  *BOWEL PROBLEMS  UNUSUAL RASH Items with * indicate a potential emergency and should be followed up as soon as possible.  Feel free to call the clinic should you have any questions or concerns. The clinic phone number is [phone].  Please show the [facility] at check-in to the Emergency Department and triage nurse.

## 2018-09-01 NOTE — Progress Notes (Signed)
Per Dr. Jana Hakim ok to treat with creatinine 1.76

## 2018-09-03 ENCOUNTER — Telehealth: Payer: Self-pay | Admitting: *Deleted

## 2018-09-03 NOTE — Telephone Encounter (Signed)
-----   Message from Trula Slade, DPM sent at 09/02/2018 11:50 AM EDT ----- Please let her know that the culture of her nail did not show fungus. Would do urea cream. Thanks.

## 2018-09-03 NOTE — Telephone Encounter (Signed)
I informed Darlene [name] of Dr. Leigh Aurora review of results and recommendations. Darlene [name] states Dr. Jacqualyn Posey said that she could come in periodically for diabetic foot care and she would like the Revitaderm40 as well. I transferred Darlene [name] to scheduler and held a Revitaderm40 for Darlene [name].

## 2018-09-07 ENCOUNTER — Other Ambulatory Visit (INDEPENDENT_AMBULATORY_CARE_PROVIDER_SITE_OTHER): Payer: Self-pay | Admitting: Family Medicine

## 2018-09-07 DIAGNOSIS — Z794 Long term (current) use of insulin: Secondary | ICD-10-CM

## 2018-09-07 DIAGNOSIS — E1140 Type 2 diabetes mellitus with diabetic neuropathy, unspecified: Secondary | ICD-10-CM

## 2018-09-08 MED ORDER — METFORMIN HCL 500 MG PO TABS: 500.0000 mg | ORAL_TABLET | Freq: Every day | ORAL | 0 refills | Status: DC

## 2018-09-10 ENCOUNTER — Ambulatory Visit (HOSPITAL_COMMUNITY): Admission: RE | Admit: 2018-09-10 | Payer: Medicare Other | Source: Ambulatory Visit

## 2018-09-10 ENCOUNTER — Other Ambulatory Visit: Payer: Self-pay

## 2018-09-10 ENCOUNTER — Ambulatory Visit (HOSPITAL_COMMUNITY)
Admission: RE | Admit: 2018-09-10 | Discharge: 2018-09-10 | Disposition: A | Discharge status: Home / Self Care | Payer: Medicare Other | Source: Ambulatory Visit | Attending: Oncology | Admitting: Oncology

## 2018-09-10 DIAGNOSIS — E119 Type 2 diabetes mellitus without complications: Secondary | ICD-10-CM | POA: Diagnosis not present

## 2018-09-10 DIAGNOSIS — E785 Hyperlipidemia, unspecified: Secondary | ICD-10-CM | POA: Insufficient documentation

## 2018-09-10 DIAGNOSIS — C50911 Malignant neoplasm of unspecified site of right female breast: Secondary | ICD-10-CM

## 2018-09-10 DIAGNOSIS — C801 Malignant (primary) neoplasm, unspecified: Secondary | ICD-10-CM | POA: Insufficient documentation

## 2018-09-10 DIAGNOSIS — C50411 Malignant neoplasm of upper-outer quadrant of right female breast: Secondary | ICD-10-CM | POA: Insufficient documentation

## 2018-09-10 DIAGNOSIS — K760 Fatty (change of) liver, not elsewhere classified: Secondary | ICD-10-CM | POA: Diagnosis not present

## 2018-09-10 DIAGNOSIS — C787 Secondary malignant neoplasm of liver and intrahepatic bile duct: Secondary | ICD-10-CM

## 2018-09-10 DIAGNOSIS — Z171 Estrogen receptor negative status [ER-]: Secondary | ICD-10-CM

## 2018-09-10 DIAGNOSIS — I700 Atherosclerosis of aorta: Secondary | ICD-10-CM | POA: Diagnosis not present

## 2018-09-10 NOTE — Progress Notes (Signed)
  Echocardiogram 2D Echocardiogram has been performed.  Darlene [name] 09/10/2018, 11:48 AM

## 2018-09-14 ENCOUNTER — Other Ambulatory Visit: Payer: Self-pay

## 2018-09-14 ENCOUNTER — Ambulatory Visit (HOSPITAL_COMMUNITY)
Admission: RE | Admit: 2018-09-14 | Discharge: 2018-09-14 | Disposition: A | Discharge status: Home / Self Care | Payer: Medicare Other | Source: Ambulatory Visit | Attending: Oncology | Admitting: Oncology

## 2018-09-14 DIAGNOSIS — C801 Malignant (primary) neoplasm, unspecified: Secondary | ICD-10-CM | POA: Diagnosis present

## 2018-09-14 DIAGNOSIS — C50911 Malignant neoplasm of unspecified site of right female breast: Secondary | ICD-10-CM

## 2018-09-14 DIAGNOSIS — C50411 Malignant neoplasm of upper-outer quadrant of right female breast: Secondary | ICD-10-CM

## 2018-09-14 DIAGNOSIS — K760 Fatty (change of) liver, not elsewhere classified: Secondary | ICD-10-CM | POA: Insufficient documentation

## 2018-09-14 DIAGNOSIS — C787 Secondary malignant neoplasm of liver and intrahepatic bile duct: Secondary | ICD-10-CM | POA: Diagnosis present

## 2018-09-14 DIAGNOSIS — Z171 Estrogen receptor negative status [ER-]: Secondary | ICD-10-CM | POA: Insufficient documentation

## 2018-09-14 MED ORDER — GADOBUTROL 1 MMOL/ML IV SOLN
9.0000 mL | Freq: Once | INTRAVENOUS | Status: AC | PRN
  Administered 2018-09-14: 9 mL via INTRAVENOUS

## 2018-09-14 NOTE — Progress Notes (Signed)
[place]  Telephone:([phone]) [phone] Fax:(336) [phone]    ID: Darlene [name] DOB: 04/19/1950  MR#: [id number]  STM#:[id number]  Patient Care Team: Starlyn Skeans, MD as PCP - General (Family Medicine) Stark Klein, MD as Consulting Physician (General Surgery) Jessi Jessop, Virgie Dad, MD as Consulting Physician (Oncology) Mauro Kaufmann, RN as Registered Nurse Rockwell Germany, RN as Registered Nurse Jacelyn Pi, MD as Consulting Physician (Endocrinology) [name], MD as Consulting Physician (Plastic Surgery) Larey Dresser, MD as Consulting Physician (Cardiology) Gery Pray, MD as Consulting Physician (Radiation Oncology) Juanita Craver, MD as Consulting Physician (Gastroenterology) Dohmeier, Asencion Partridge, MD as Consulting Physician (Neurology)  OTHER MD:   CHIEF COMPLAINT:  HER-2 positive, estrogen receptor negative locally recurrent disease  CURRENT TREATMENT:  Trastuzumab; to add Pertuzumab   INTERVAL HISTORY:  Darlene [name] returns today for follow-up and treatment of her estrogen receptor negative but HER-2 amplified breast cancer. She was last seen here on 06/23/2018.   She continues on trastuzumab.  She tolerates this with no side effects that she is aware of.  She also continues on abraxane.  She continues to have neuropathy in her feet but this is unchanged from baseline.  She has no neuropathy in her hands.  Darlene [name]'s last echocardiogram on 09/10/2018, showed an ejection fraction in the 55% - 60% range.   Since her last visit here, she underwent a right foot xray on 08/11/2018 showing: Significant bunion deformities present.  No evidence of acute fracture.  She also underwent a liver MRI on 09/14/18 showing: Stable treated metastasis in the RIGHT hepatic lobe without evidence of progression. No new hepatic lesions present. Stable nodular enlargement of the LEFT adrenal gland which was not hypermetabolic on comparison PET-CT scans.   REVIEW OF  SYSTEMS: Darlene [name] cannot exercise as much as she would like because she has a bunion problem.  She has seen podiatry and she can get this taken care of.  She wanted to discuss that today.  She is getting retinal therapy under Dr. Tye Savoy and she expects to be able to not lose her vision thanks to his efforts.  She tells me her most recent A1c was 5.4 which is excellent.  A detailed review of systems today was otherwise stable.   BREAST CANCER HISTORY: From the original intake note:  "Darlene [name]" had screening mammography at her gynecologist suggestive of a possible abnormality in the right breast. However right diagnostic mammogram 04/12/2013 at [facility] was negative. More recently however, she noted a lump in her right breast and brought it to her gynecologist's attention. On 07/18/2014 the patient had bilateral diagnostic mammography with tomosynthesis at [facility]. The breast density was category B. In the right breast upper outer quadrant there was an area of focal asymmetry with indistinct margins.[name] Ultrasound was obtained and showed in addition to the mass in question, measuring 1.7 cm, an abnormal-appearing lymph node in the right axillary tail.  On 07/19/2014 the patient underwent biopsy of the right breast massin question as well as a right axillary lymph node. Both were positive for invasive ductal carcinoma, grade 2, estrogen and progesterone receptor negative, with an MIB-1 of 90%, and HER-2 pending. Incidentally the lymph node biopsy showed a lymphocytic inflammatory component but no lymph node architecture.  Her subsequent history is as detailed above.   PAST MEDICAL HISTORY: Past Medical History:  Diagnosis Date   Anemia    hx of - during 1st round chemo   Anxiety    Arthritis    in  fingers, shoulders   Back pain    Balance problem    Breast cancer (HCC)    Breast cancer of upper-outer quadrant of right female breast ([place]) 07/22/2014   Bunion, right foot    Clumsiness     Constipation    Depression    Diabetes mellitus without complication ([place])    26+ years   Diabetic retinopathy ([place])    torn retina   Eating disorder    binge eating   Epistaxis 08/26/2014   Fatty liver    HCAP (healthcare-associated pneumonia) 12/18/2014   Headache    Heart murmur    never had any problems   History of radiation therapy 04/05/15-05/19/15   right chest wall was treated to 50.4 Gy in 28 fractions, right mastectomy scar was treated to 10 Gy in 5 fractions   Hyperlipidemia    Hypertension    Joint pain    Multiple food allergies    Neuromuscular disorder ([place])    diabetic neuropathy   Obesity    Obstructive sleep apnea on CPAP    uses cpap nightly   Ovarian cyst rupture    possible   Pneumonia    Pneumonia 11/2014   Radiation 04/05/15-05/19/15   right chest wall 50.4 Gy, mastectomy scar 10 Gy   Sleep apnea    Thyroid nodule 06/2014    PAST SURGICAL HISTORY: Past Surgical History:  Procedure Laterality Date   APPENDECTOMY     BIOPSY THYROID Bilateral 06/2014   2 nodules - Benign    BREAST LUMPECTOMY WITH RADIOACTIVE SEED AND SENTINEL LYMPH NODE BIOPSY Right 12/27/2014   Procedure: BREAST LUMPECTOMY WITH RADIOACTIVE SEED AND SENTINEL LYMPH NODE BIOPSY;  Surgeon: Stark Klein, MD;  Location: [place];  Service: General;  Laterality: Right;   BREAST REDUCTION SURGERY Bilateral 01/06/2015   Procedure: BILATERAL BREAST REDUCTION, RIGHT ONCOPLASTIC RECONSTRUCTION),LEFT BREAST REDUCTION FOR SYMMETRY;  Surgeon: [name], MD;  Location: [place];  Service: Plastics;  Laterality: Bilateral;   BREATH TEK H PYLORI N/A 09/15/2012   Procedure: BREATH TEK H PYLORI;  Surgeon: Pedro Earls, MD;  Location: [facility];  Service: General;  Laterality: N/A;   BUNIONECTOMY Left 12/2013   CARPAL TUNNEL RELEASE Right    CARPAL TUNNEL RELEASE Left    CATARACT EXTRACTION     6/19   COLONOSCOPY     DUPUYTREN CONTRACTURE  RELEASE     GASTRIC ROUX-EN-Y N/A 11/02/2012   Procedure: LAPAROSCOPIC ROUX-EN-Y GASTRIC;  Surgeon: Pedro Earls, MD;  Location: [facility];  Service: General;  Laterality: N/A;   IR GENERIC HISTORICAL  03/18/2016   IR US GUIDE VASC ACCESS LEFT 03/18/2016 Markus Daft, MD [facility]   IR GENERIC HISTORICAL  03/18/2016   IR FLUORO GUIDE CV LINE LEFT 03/18/2016 Markus Daft, MD [facility]   LAPAROSCOPIC APPENDECTOMY N/A 07/22/2015   Procedure: APPENDECTOMY LAPAROSCOPIC;  Surgeon: Michael Boston, MD;  Location: [facility];  Service: General;  Laterality: N/A;   MASTECTOMY Right 02/15/2015   modified   MODIFIED MASTECTOMY Right 02/15/2015   Procedure: RIGHT MODIFIED RADICAL MASTECTOMY;  Surgeon: Stark Klein, MD;  Location: [facility];  Service: General;  Laterality: Right;   PORT-A-CATH REMOVAL Left 10/10/2015   Procedure: PORT REMOVAL;  Surgeon: Stark Klein, MD;  Location: [place];  Service: General;  Laterality: Left;   PORTACATH PLACEMENT Left 08/02/2014   Procedure: INSERTION PORT-A-CATH;  Surgeon: Stark Klein, MD;  Location: [facility];  Service: General;  Laterality: Left;  PORTACATH PLACEMENT N/A 11/05/2016   Procedure: INSERTION PORT-A-CATH;  Surgeon: Stark Klein, MD;  Location: [place];  Service: General;  Laterality: N/A;   RETINAL LASER PROCEDURE     retina tear on left eye   ROTATOR CUFF REPAIR Right 2005   SCAR REVISION Right 12/08/2015   Procedure: COMPLEX REPAIR RIGHT CHEST 10-15CM;  Surgeon: [name], MD;  Location: [place];  Service: Plastics;  Laterality: Right;   TOE SURGERY  1970s   bone spur   TRIGGER FINGER RELEASE     x3    FAMILY HISTORY Family History  Problem Relation Age of Onset   CVA Mother    Heart disease Mother    Sudden death Mother    Diabetes Father    Heart failure Father    Hypertension Father    Kidney disease Father    Obesity Father    Hypertension Sister    Diabetes Brother    Breast cancer  Paternal Grandmother 81   Diabetes Paternal Uncle    Ovarian cancer Neg Hx    The patient's father died from complications of diabetes at age 48. The patient's mother died at age 26 with a subarachnoid hemorrhage. The patient had one brother, one sister. The only breast cancer was the patient's paternal grandmother diagnosed in her 14s. There is no history of ovarian cancer in the family.   GYNECOLOGIC HISTORY:  Patient's last menstrual period was 10/22/2007.  menarche age 53, the patient is GX P0. She went through the change of life in 2011. She did not take hormone replacement.   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 Her husband [name] is a retired Visual merchandiser (he taught at [employer]).  In May 2020 [place].  [name] has 2 children from an earlier marriage, Rockey Situ who teaches in [place], and Jacquelene Kopecky,  who works at the [employer] in in [place]. He has 1 grand son, aged 46. The patient and her husband are not church attenders.   ADVANCED DIRECTIVES:  Not in place   HEALTH MAINTENANCE: Social History   Tobacco Use   Smoking status: Never Smoker   Smokeless tobacco: Never Used  Substance Use Topics   Alcohol use: Yes    Alcohol/week: 1.0 - 2.0 standard drinks    Types: 1 - 2 Standard drinks or equivalent per week    Comment: social   Drug use: No     Colonoscopy:  May 2016  PAP:  Bone density: 07/01/2016 at [facility] showed normal, with a T score of -0.7  Lipid panel:  Allergies  Allergen Reactions   Nsaids Other (See Comments)    H/o gastric bypass - avoid NSAIDs   Tape Hives, Rash and Other (See Comments)    Adhesives in bandaids    Current Outpatient Medications  Medication Sig Dispense Refill   acetaminophen (TYLENOL) 325 MG tablet Take  650 mg by mouth 2 (two) times daily as needed for moderate pain or headache.      Blood Glucose Monitoring Suppl (ONETOUCH VERIO FLEX SYSTEM) w/Device KIT 1 kit by Does not apply route 4 (four) times daily. Test blood sugars four times daily 1 kit 0   buPROPion (WELLBUTRIN SR) 200 MG 12 hr tablet TAKE 1 TABLET BY MOUTH DAILY AT 12 NOON. 30 tablet  0   CALCIUM CITRATE-VITAMIN D PO Take 1 tablet by mouth 3 (three) times daily.      cholecalciferol (VITAMIN D) 1000 units tablet Take 1,000 Units by mouth daily.     Cyanocobalamin (VITAMIN B-12 PO) Take 1 tablet by mouth every 14 (fourteen) days.     diphenhydrAMINE (BENADRYL) 25 MG tablet Take 25 mg by mouth daily as needed for allergies or sleep.      DUREZOL 0.05 % EMUL      glucose blood (ONETOUCH VERIO) test strip Use as instructed 100 each 0   Insulin Glargine (LANTUS SOLOSTAR) 100 UNIT/ML Solostar Pen Inject 12 Units into the skin daily. 5 pen 0   Insulin Pen Needle (BD PEN NEEDLE NANO U/F) 32G X 4 MM MISC 1 Package by Does not apply route daily at 12 noon. 100 each 0   liraglutide (VICTOZA) 18 MG/3ML SOPN INJECT 1.'8MG'$ (0.3 MLS) INTO THE SKIN EVERY MORNING 3 pen 0   loratadine (CLARITIN) 10 MG tablet Take 10 mg by mouth as needed for allergies.     losartan (COZAAR) 100 MG tablet Take 1 tablet (100 mg total) by mouth daily. 30 tablet 0   metFORMIN (GLUCOPHAGE) 500 MG tablet Take 1 tablet (500 mg total) by mouth daily. 30 tablet 0   ofloxacin (OCUFLOX) 0.3 % ophthalmic solution      OneTouch Delica Lancets 16X MISC 1 Package by Does not apply route daily. 100 each 0   Pediatric Multivitamins-Iron (FLINTSTONES PLUS IRON) chewable tablet Chew 1 tablet by mouth 2 (two) times daily.     Probiotic Product (CVS PROBIOTIC PO) Take 1 capsule by mouth daily.      prochlorperazine (COMPAZINE) 10 MG tablet Take 1 tablet (10 mg total) by mouth every 6 (six) hours as needed for nausea or vomiting. 30 tablet 0   Propylene Glycol (SYSTANE  BALANCE OP) Place 1-2 drops into both eyes 3 (three) times daily as needed (for dry eyes).      senna-docusate (SENNA PLUS) 8.6-50 MG tablet Take 1 tablet by mouth daily as needed for moderate constipation.      tobramycin (TOBREX) 0.3 % ophthalmic solution      topiramate (TOPAMAX) 25 MG tablet Take 1 tablet (25 mg total) by mouth daily. 30 tablet 0   vortioxetine HBr (TRINTELLIX) 20 MG TABS tablet Take 1 tablet (20 mg total) by mouth at bedtime. 30 tablet 0   No current facility-administered medications for this visit.     OBJECTIVE: Latest white woman who appears stated age  29:   09/15/18 0907  BP: 125/62  Pulse: 68  Resp: 17  Temp: 98 F (36.7 C)  SpO2: 100%     Body mass index is 28.77 kg/m.     ECOG FS:1 - Symptomatic but completely ambulatory   Filed Weights   09/15/18 0907  Weight: 192 lb (87.1 kg)    Sclerae unicteric, EOMs intact Wearing a mask No cervical or supraclavicular adenopathy Lungs no rales or rhonchi Heart regular rate and rhythm Abd soft, nontender, positive bowel sounds MSK no focal spinal tenderness, no upper extremity lymphedema Neuro: nonfocal, well oriented, appropriate affect Breasts: Status post right mastectomy.  There is no evidence of chest wall recurrence.  Left breast is benign.  Both axillae are benign.   LAB RESULTS:  CMP     Component Value Date/Time   NA 142 09/15/2018 0855   NA 143 03/11/2017 0841   NA 140 01/07/2017 0758   K 3.9 09/15/2018 0855  K 4.1 01/07/2017 0758   CL 113 (H) 09/15/2018 0855   CO2 20 (L) 09/15/2018 0855   CO2 24 01/07/2017 0758   GLUCOSE 134 (H) 09/15/2018 0855   GLUCOSE 134 01/07/2017 0758   BUN 33 (H) 09/15/2018 0855   BUN 40 (H) 03/11/2017 0841   BUN 33.6 (H) 01/07/2017 0758   CREATININE 1.83 (H) 09/15/2018 0855   CREATININE 1.62 (H) 04/28/2018 0915   CREATININE 1.5 (H) 01/07/2017 0758   CALCIUM 8.8 (L) 09/15/2018 0855   CALCIUM 9.3 01/07/2017 0758   PROT 6.5 09/15/2018 0855    PROT 6.9 03/11/2017 0841   PROT 6.7 01/07/2017 0758   ALBUMIN 3.9 09/15/2018 0855   ALBUMIN 4.5 03/11/2017 0841   ALBUMIN 3.6 01/07/2017 0758   AST 33 09/15/2018 0855   AST 43 (H) 04/28/2018 0915   AST 51 (H) 01/07/2017 0758   ALT 44 09/15/2018 0855   ALT 61 (H) 04/28/2018 0915   ALT 82 (H) 01/07/2017 0758   ALKPHOS 94 09/15/2018 0855   ALKPHOS 115 01/07/2017 0758   BILITOT 0.6 09/15/2018 0855   BILITOT 0.6 04/28/2018 0915   BILITOT 0.72 01/07/2017 0758   GFRNONAA 28 (L) 09/15/2018 0855   GFRNONAA 33 (L) 04/28/2018 0915   GFRNONAA 55 (L) 02/18/2013 0827   GFRAA 33 (L) 09/15/2018 0855   GFRAA 38 (L) 04/28/2018 0915   GFRAA 63 02/18/2013 0827    INo results found for: SPEP, UPEP  Lab Results  Component Value Date   WBC 3.7 (L) 09/15/2018   NEUTROABS 2.4 09/15/2018   HGB 10.1 (L) 09/15/2018   HCT 30.9 (L) 09/15/2018   MCV 97.2 09/15/2018   PLT 130 (L) 09/15/2018      Chemistry      Component Value Date/Time   NA 142 09/15/2018 0855   NA 143 03/11/2017 0841   NA 140 01/07/2017 0758   K 3.9 09/15/2018 0855   K 4.1 01/07/2017 0758   CL 113 (H) 09/15/2018 0855   CO2 20 (L) 09/15/2018 0855   CO2 24 01/07/2017 0758   BUN 33 (H) 09/15/2018 0855   BUN 40 (H) 03/11/2017 0841   BUN 33.6 (H) 01/07/2017 0758   CREATININE 1.83 (H) 09/15/2018 0855   CREATININE 1.62 (H) 04/28/2018 0915   CREATININE 1.5 (H) 01/07/2017 0758      Component Value Date/Time   CALCIUM 8.8 (L) 09/15/2018 0855   CALCIUM 9.3 01/07/2017 0758   ALKPHOS 94 09/15/2018 0855   ALKPHOS 115 01/07/2017 0758   AST 33 09/15/2018 0855   AST 43 (H) 04/28/2018 0915   AST 51 (H) 01/07/2017 0758   ALT 44 09/15/2018 0855   ALT 61 (H) 04/28/2018 0915   ALT 82 (H) 01/07/2017 0758   BILITOT 0.6 09/15/2018 0855   BILITOT 0.6 04/28/2018 0915   BILITOT 0.72 01/07/2017 0758       No results found for: LABCA2  No components found for: [id number]  No results for input(s): INR in the last 168  hours.  Urinalysis    Component Value Date/Time   COLORURINE YELLOW 07/21/2015 [place] 07/21/2015 2328   LABSPEC 1.020 07/21/2015 2328   PHURINE 6.0 07/21/2015 2328   GLUCOSEU NEGATIVE 07/21/2015 [place] (A) 07/21/2015 2328   BILIRUBINUR NEGATIVE 07/21/2015 2328   BILIRUBINUR neg 06/29/2013 [place] 07/21/2015 2328   PROTEINUR 30 (A) 07/21/2015 2328   UROBILINOGEN negative 06/29/2013 1017   NITRITE NEGATIVE 07/21/2015 2328  LEUKOCYTESUR NEGATIVE 07/21/2015 2328    STUDIES: Mr Liver W Wo Contrast  Result Date: 09/14/2018 CLINICAL DATA:  Breast cancer suspected metastatic disease. Liver metastasis. Evaluate response to therapy. EXAM: MRI ABDOMEN WITHOUT AND WITH CONTRAST TECHNIQUE: Multiplanar multisequence MR imaging of the abdomen was performed both before and after the administration of intravenous contrast. CONTRAST:  9 mL Gadavist COMPARISON:  PET-CT 05/12/2018, MRI 02/24/2018, PET-CT scan 12/09/2017 FINDINGS: Lower chest:  Lung bases are clear. Hepatobiliary: Within the RIGHT hepatic lobe, 1.5 cm lesion is subtly hyperintense on T2 weighted imaging and not changed from comparison exam (image 8/4). Previously there was a isolated hypermetabolic lesion at this level. No additional hepatic lesions are identified. On postcontrast imaging there is no suspicious enhancement. Mild capsular retraction this level (image 13/1203). No new enhancing lesions in liver. Gallbladder normal.  Common bile duct Pancreas: Normal pancreatic parenchymal intensity. No ductal dilatation or inflammation. Spleen: Normal spleen. Adrenals/urinary tract: LEFT adrenal gland is nodular without metabolic activity on comparison exam. No change in size of the measuring 1.8 x 3.0 cm (image 13/1203). RIGHT adrenal gland normal. Review of the kidneys normal. Stomach/Bowel: Stomach and limited of the small bowel is unremarkable Vascular/Lymphatic: Abdominal aortic normal caliber.  No retroperitoneal periportal lymphadenopathy. Musculoskeletal: No aggressive osseous lesion IMPRESSION: 1. Stable treated metastasis in the RIGHT hepatic lobe without evidence of progression. 2. No new hepatic lesions present. 3. Stable nodular enlargement of the LEFT adrenal gland which was not hypermetabolic on comparison PET-CT scans. Electronically Signed   By: Suzy Bouchard M.D.   On: 09/14/2018 17:03     ASSESSMENT: 68 y.o. [place] woman status post right breast upper outer quadrant and right axillary lymph node biopsy 07/19/2014 for a cT2  pN1, stage IIB  invasive ductal carcinoma, grade 2, estrogen and progesterone receptor negative, with an MIB-1 of 90%, and HER-2 amplified  (1) neoadjuvant chemotherapy started 08/08/2014, consisting of carboplatin, docetaxel, trastuzumab and pertuzumab given every 3 weeks 6, completed 11/21/2014  (a) breast MRI after initial 3 cycles shows a significant response  (b) breast MRI after 6 cycles shows a complete radiologic response   (2) trastuzumab continued through 10/02/2015 (to end of capecitabine therapy)  (a) echo 02/13/2015 shows an EF of 60-65%  (b)  echo 05/09/2015 shows an ejection fraction of 60-65%  (c) echocardiogram 06/27/2015 shows an ejection fraction of 55-60%.  (d) echocardiogram 01/22/2017 shows an ejection fraction of 55-60%  (3) right lumpectomy with sentinel lymph node biopsy on 12/27/14 showed a residual  ypT2 ypN1a, stage IIB invasive ductal carcinoma, grade 2, with repeat estrogen and progesterone receptors again negative  (4) status post right oncoplastic surgery (with left reduction mammoplasty) 01/06/2015 showing in the right breast multiple foci of intralymphatic carcinoma in random sections  (5)  Right modified radical mastectomy 02/15/2014 showed no residual invasive disease; there was DCIS but margins were negative; all 15 axillary lymph nodes were clear; ypTis ypN0  (a)  The patient has decided against  reconstruction.  (6)  Postmastectomy radiation completed 05/18/2015 with capecitabine sensitization  (7) adjuvant capecitabine continued through 09/27/2015  (8) hepatic steatosis with increased fibrosis score (at risk for cirrhosis)  (9) thyroid biopsy (left lobe inferiorly) 2 shows benign tissue 06/28/2015  (10) LOCAL RECURRENCE:  (a) PET scan 12/06/2015 Notes a right retropectoral lymph node measuring 0.9 cm with an SUV max of 5.5  (b) repeat PET scan 03/11/2016 finds the right subpectoral lymph node to now measure 1.3 cm with an SUV max of 11.8.  (  c) PET scan 05/07/2016 shows a significant response to T-DM 1  (d) PET scan 07/10/2017 is clear except for what appears to be a single new liver lesion   (11) started T-DM1 03/21/2016, completed 8 doses 08/13/2016  (a) PET scan 08/19/2016 shows a good response but measurable residual disease  (b) PET scan on 01/16/2017 shows a continuing response  (12) maintenance trastuzumab started 09/03/2016--changed to every 28 days as of 01/28/2017  (a) repeat echocardiogram 07/08/2016 shows an ejection fraction of 60-65%.  (b) echocardiogram on 10/08/2016 demonstrates LVEF of 55-60% (followed by Dr. Haroldine Laws)  (c) echo 01/22/2017 shows an ejection fraction in the 55-60% range  (d) echo 04/24/2017 shows an ejection fraction in the 55-60% range (recommendation for echo every 6 months by Dr. Aundra Dubin)  (e) trastuzumab changed to T-DM 1 after trastuzumab dose 07/15/2017  METASTATIC DISEASE: July 2019 (13)  Radiation to the area of persistent disease in the right upper chest completed 10/30/2016   (14) T-DM 1 resumed 08/12/2017, repeated every 21 days  (a) baseline liver MRI 08/02/2017 shows a 1.4 cm mass, "hot" on PET 07/10/2017  (b) repeat liver MRI 11/18/2017 after 3 cycles of T-DM 1, shows slight enlargement  (c) T-DM 1 discontinued after 09/23/2017 dose  (15) trastuzumab resumed 11/18/2017, to be repeated every 4 weeks,   (a) changed back to  every 3 weeks in 03/2018  (b) echo 09/18/2018 shows an ejection fraction in the 50-55% range.  (c) pertuzumab to be added with 10/06/2018 dose  (16) liver biopsy 11/03/2017 shows adenocarcinoma, most consistent with an upper gastrointestinal primary; this tumor was estrogen receptor and HER-2 negative  (a) GI workup for occult primary ([place]) negative  (b) PET 12/09/2017 shows no obvious primary.  There is a right subpectoral lymph node noted as well  (c) CA 27-29 and CA 19-9 uninformative; CEA 6.72 OCT 2019   (17) started Abraxane on 12/111/2019, given days 1 and 8 on a 21 day cycle   (a) restaging with MRI liver 02/24/2018 shows a gratifying initial response  (b) PET scan 05/14/2018 shows resolution of the measurable disease in the liver  (c) liver MRI 09/14/2018 shows no change compared to prior PET and MRI.  (d) Abraxane held after 09/01/2018 dose  (18) consider Yttrium or other local treatment to liver per IR if liver recurrence develops   PLAN: Darlene [name] has done very well with her trastuzumab/Abraxane treatments.  She has not had progressive neuropathy and her liver looks about as good as it could.  At this point I think we can try stopping the Abraxane and see if we can maintain with the anti-HER-2 alone.  She will receive Herceptin today.  I am adding Perjeta to her treatment 3 weeks from now.  If she tolerates that well we will continue that and then repeat an MRI of the liver later this year.  He is considering foot surgery.  I have no problem at all with that.  Her counts are adequate.  It will help her exercise better once that is taking care of.  I am adding Questran to her medication list and I have asked her to take it twice daily if she starts having diarrhea from the [place].  She will see me in 6 weeks to make sure she is tolerating treatment well.  She knows to call for any other issue that may develop before the next visit.    Virgie Dad. Emila Steinhauser, MD  09/15/18 9:51  AM [facility] [place]  [place], [place] [address] Tel. [phone]    Fax. [phone]  I, Jacqualyn Posey am acting as a Education administrator for Chauncey Cruel, MD.   I, Lurline Del MD, have reviewed the above documentation for accuracy and completeness, and I agree with the above.

## 2018-09-15 ENCOUNTER — Other Ambulatory Visit: Payer: Self-pay

## 2018-09-15 ENCOUNTER — Inpatient Hospital Stay: Payer: Medicare Other

## 2018-09-15 ENCOUNTER — Inpatient Hospital Stay (HOSPITAL_BASED_OUTPATIENT_CLINIC_OR_DEPARTMENT_OTHER): Payer: Medicare Other | Admitting: Oncology

## 2018-09-15 VITALS — BP 125/62 | HR 68 | Temp 98.0°F | Resp 17 | Ht 68.5 in | Wt 192.0 lb

## 2018-09-15 DIAGNOSIS — C50911 Malignant neoplasm of unspecified site of right female breast: Secondary | ICD-10-CM

## 2018-09-15 DIAGNOSIS — C787 Secondary malignant neoplasm of liver and intrahepatic bile duct: Secondary | ICD-10-CM

## 2018-09-15 DIAGNOSIS — Z95828 Presence of other vascular implants and grafts: Secondary | ICD-10-CM

## 2018-09-15 DIAGNOSIS — C50411 Malignant neoplasm of upper-outer quadrant of right female breast: Secondary | ICD-10-CM

## 2018-09-15 DIAGNOSIS — Z5112 Encounter for antineoplastic immunotherapy: Secondary | ICD-10-CM | POA: Diagnosis not present

## 2018-09-15 DIAGNOSIS — Z171 Estrogen receptor negative status [ER-]: Secondary | ICD-10-CM

## 2018-09-15 DIAGNOSIS — C801 Malignant (primary) neoplasm, unspecified: Secondary | ICD-10-CM

## 2018-09-15 LAB — COMPREHENSIVE METABOLIC PANEL
ALT: 44 U/L (ref 0–44)
AST: 33 U/L (ref 15–41)
Albumin: 3.9 g/dL (ref 3.5–5.0)
Alkaline Phosphatase: 94 U/L (ref 38–126)
Anion gap: 9 (ref 5–15)
BUN: 33 mg/dL — ABNORMAL HIGH (ref 8–23)
CO2: 20 mmol/L — ABNORMAL LOW (ref 22–32)
Calcium: 8.8 mg/dL — ABNORMAL LOW (ref 8.9–10.3)
Chloride: 113 mmol/L — ABNORMAL HIGH (ref 98–111)
Creatinine, Ser: 1.83 mg/dL — ABNORMAL HIGH (ref 0.44–1.00)
GFR calc Af Amer: 33 mL/min — ABNORMAL LOW (ref >60)
GFR calc non Af Amer: 28 mL/min — ABNORMAL LOW (ref >60)
Glucose, Bld: 134 mg/dL — ABNORMAL HIGH (ref 70–99)
Potassium: 3.9 mmol/L (ref 3.5–5.1)
Sodium: 142 mmol/L (ref 135–145)
Total Bilirubin: 0.6 mg/dL (ref 0.3–1.2)
Total Protein: 6.5 g/dL (ref 6.5–8.1)

## 2018-09-15 LAB — CBC WITH DIFFERENTIAL/PLATELET
Abs Immature Granulocytes: 0.01 10*3/uL (ref 0.00–0.07)
Basophils Absolute: 0 10*3/uL (ref 0.0–0.1)
Basophils Relative: 1 %
Eosinophils Absolute: 0.1 10*3/uL (ref 0.0–0.5)
Eosinophils Relative: 2 %
HCT: 30.9 % — ABNORMAL LOW (ref 36.0–46.0)
Hemoglobin: 10.1 g/dL — ABNORMAL LOW (ref 12.0–15.0)
Immature Granulocytes: 0 %
Lymphocytes Relative: 22 %
Lymphs Abs: 0.8 10*3/uL (ref 0.7–4.0)
MCH: 31.8 pg (ref 26.0–34.0)
MCHC: 32.7 g/dL (ref 30.0–36.0)
MCV: 97.2 fL (ref 80.0–100.0)
Monocytes Absolute: 0.4 10*3/uL (ref 0.1–1.0)
Monocytes Relative: 11 %
Neutro Abs: 2.4 10*3/uL (ref 1.7–7.7)
Neutrophils Relative %: 64 %
Platelets: 130 10*3/uL — ABNORMAL LOW (ref 150–400)
RBC: 3.18 MIL/uL — ABNORMAL LOW (ref 3.87–5.11)
RDW: 14.5 % (ref 11.5–15.5)
WBC: 3.7 10*3/uL — ABNORMAL LOW (ref 4.0–10.5)
nRBC: 0 % (ref 0.0–0.2)

## 2018-09-15 MED ORDER — DIPHENHYDRAMINE HCL 25 MG PO CAPS
25.0000 mg | ORAL_CAPSULE | Freq: Once | ORAL | Status: AC
  Administered 2018-09-15: 25 mg via ORAL

## 2018-09-15 MED ORDER — ACETAMINOPHEN 325 MG PO TABS
650.0000 mg | ORAL_TABLET | Freq: Once | ORAL | Status: AC
  Administered 2018-09-15: 650 mg via ORAL

## 2018-09-15 MED ORDER — ACETAMINOPHEN 325 MG PO TABS
ORAL_TABLET | ORAL | Status: AC
  Filled 2018-09-15: qty 2

## 2018-09-15 MED ORDER — HEPARIN SOD (PORK) LOCK FLUSH 100 UNIT/ML IV SOLN
500.0000 [IU] | Freq: Once | INTRAVENOUS | Status: AC | PRN
  Administered 2018-09-15: 500 [IU]
  Filled 2018-09-15: qty 5

## 2018-09-15 MED ORDER — CHOLESTYRAMINE 4 G PO PACK: 4.0000 g | PACK | Freq: Two times a day (BID) | ORAL | 12 refills | Status: DC | PRN

## 2018-09-15 MED ORDER — TRASTUZUMAB-ANNS CHEMO 420 MG IV SOLR
6.0000 mg/kg | Freq: Once | INTRAVENOUS | Status: AC
  Administered 2018-09-15: 546 mg via INTRAVENOUS
  Filled 2018-09-15: qty 26

## 2018-09-15 MED ORDER — SODIUM CHLORIDE 0.9 % IV SOLN
Freq: Once | INTRAVENOUS | Status: AC
  Administered 2018-09-15: 10:00:00 via INTRAVENOUS
  Filled 2018-09-15: qty 250

## 2018-09-15 MED ORDER — DIPHENHYDRAMINE HCL 25 MG PO CAPS
ORAL_CAPSULE | ORAL | Status: AC
  Filled 2018-09-15: qty 1

## 2018-09-15 MED ORDER — SODIUM CHLORIDE 0.9% FLUSH
10.0000 mL | INTRAVENOUS | Status: DC | PRN
  Administered 2018-09-15: 10 mL
  Filled 2018-09-15: qty 10

## 2018-09-15 NOTE — Addendum Note (Signed)
Addended by: Chauncey Cruel on: 09/15/2018 10:06 AM   Modules accepted: Orders

## 2018-09-15 NOTE — Progress Notes (Signed)
Trastuzumab only today per Dr. Jana Hakim.   Demetrius Charity, PharmD, [facility] Pharmac[facility] Pharmacy Phone: [phone] 09/15/2018

## 2018-09-15 NOTE — Patient Instructions (Signed)
[facility] Discharge Instructions for Patients Receiving Chemotherapy  Today you received the following chemotherapy agents: Kanjinti  To help prevent nausea and vomiting after your treatment, we encourage you to take your nausea medication as directed.    If you develop nausea and vomiting that is not controlled by your nausea medication, call the clinic.   BELOW ARE SYMPTOMS THAT SHOULD BE REPORTED IMMEDIATELY:  *FEVER GREATER THAN 100.5 F  *CHILLS WITH OR WITHOUT FEVER  NAUSEA AND VOMITING THAT IS NOT CONTROLLED WITH YOUR NAUSEA MEDICATION  *UNUSUAL SHORTNESS OF BREATH  *UNUSUAL BRUISING OR BLEEDING  TENDERNESS IN MOUTH AND THROAT WITH OR WITHOUT PRESENCE OF ULCERS  *URINARY PROBLEMS  *BOWEL PROBLEMS  UNUSUAL RASH Items with * indicate a potential emergency and should be followed up as soon as possible.  Feel free to call the clinic should you have any questions or concerns. The clinic phone number is [phone].  Please show the CHEMO ALERT CARD at check-in to the Emergency Department and triage nurse.   

## 2018-09-16 ENCOUNTER — Telehealth (INDEPENDENT_AMBULATORY_CARE_PROVIDER_SITE_OTHER): Payer: Medicare Other | Admitting: Family Medicine

## 2018-09-16 ENCOUNTER — Telehealth: Payer: Self-pay | Admitting: Oncology

## 2018-09-16 ENCOUNTER — Encounter (INDEPENDENT_AMBULATORY_CARE_PROVIDER_SITE_OTHER): Payer: Self-pay | Admitting: Family Medicine

## 2018-09-16 DIAGNOSIS — Z6831 Body mass index (BMI) 31.0-31.9, adult: Secondary | ICD-10-CM

## 2018-09-16 DIAGNOSIS — F3289 Other specified depressive episodes: Secondary | ICD-10-CM | POA: Diagnosis not present

## 2018-09-16 DIAGNOSIS — E1140 Type 2 diabetes mellitus with diabetic neuropathy, unspecified: Secondary | ICD-10-CM

## 2018-09-16 DIAGNOSIS — I10 Essential (primary) hypertension: Secondary | ICD-10-CM

## 2018-09-16 DIAGNOSIS — E669 Obesity, unspecified: Secondary | ICD-10-CM

## 2018-09-16 DIAGNOSIS — E1121 Type 2 diabetes mellitus with diabetic nephropathy: Secondary | ICD-10-CM | POA: Diagnosis not present

## 2018-09-16 DIAGNOSIS — Z794 Long term (current) use of insulin: Secondary | ICD-10-CM

## 2018-09-16 MED ORDER — ONETOUCH VERIO VI STRP: ORAL_STRIP | 0 refills | Status: DC

## 2018-09-16 MED ORDER — METFORMIN HCL 500 MG PO TABS: 500.0000 mg | ORAL_TABLET | Freq: Every day | ORAL | 0 refills | Status: DC

## 2018-09-16 MED ORDER — LANTUS SOLOSTAR 100 UNIT/ML ~~LOC~~ SOPN: 12.0000 [IU] | PEN_INJECTOR | Freq: Every day | SUBCUTANEOUS | 0 refills | Status: DC

## 2018-09-16 MED ORDER — VORTIOXETINE HBR 20 MG PO TABS: 20.0000 mg | ORAL_TABLET | Freq: Every day | ORAL | 0 refills | Status: DC

## 2018-09-16 MED ORDER — BUPROPION HCL ER (SR) 200 MG PO TB12: ORAL_TABLET | ORAL | 0 refills | Status: DC

## 2018-09-16 MED ORDER — VICTOZA 18 MG/3ML ~~LOC~~ SOPN: PEN_INJECTOR | SUBCUTANEOUS | 0 refills | Status: DC

## 2018-09-16 MED ORDER — ONETOUCH DELICA LANCETS 33G MISC: 1.0000 | Freq: Every day | 0 refills | Status: DC

## 2018-09-16 MED ORDER — LOSARTAN POTASSIUM 100 MG PO TABS: 100.0000 mg | ORAL_TABLET | Freq: Every day | ORAL | 0 refills | Status: DC

## 2018-09-16 MED ORDER — TOPIRAMATE 25 MG PO TABS: 25.0000 mg | ORAL_TABLET | Freq: Every day | ORAL | 0 refills | Status: DC

## 2018-09-16 MED ORDER — BD PEN NEEDLE NANO U/F 32G X 4 MM MISC: 1.0000 | Freq: Every day | 0 refills | Status: DC

## 2018-09-16 NOTE — Telephone Encounter (Signed)
I talk with patient regarding schedule  

## 2018-09-17 NOTE — Progress Notes (Signed)
Office: [phone]  /  Fax: [phone] TeleHealth Visit:  Darlene [name] has verbally consented to this TeleHealth visit today. The patient is located at home, the provider is located at the [employer] [facility] office. The participants in this visit include the listed provider and patient. The visit was conducted today via face time.  HPI:   Chief Complaint: OBESITY Darlene [name] is here to discuss her progress with her obesity treatment plan. She is on the keep a food journal with 1300-1500 calories and 85+ grams of protein daily and is following her eating plan approximately 85 % of the time. She states she is exercising 0 minutes 0 times per week. Darlene [name]'s weight was 182 lbs at her Oncology appointment yesterday, and she continues to do well with weight loss. She feels she is doing well with journaling, and trying to increase fruit and vegetables. Her hunger is controlled overall.  We were unable to weigh the patient today for this TeleHealth visit. She feels as if she has lost weight since her last visit. She has lost 30 lbs since starting treatment with [name].  Diabetes II [name] has a diagnosis of diabetes type II. Darlene [name]'s recent A1c was well controlled at 5.4. She had 2-3 low blood sugar approximately in 60's after drinking 2 glasses of wine. So she has limited her wine to 1 glass. She is doing well with diet. She has been working on intensive lifestyle modifications including diet, exercise, and weight loss to help control her blood glucose levels.  Hypertension Darlene [name] is a 68 y.o. female with hypertension. [name] states her blood pressure this morning was 125/68, which is normal for her. She denies chest pain, headaches, or dizziness. She is working on weight loss to help control her blood pressure with the goal of decreasing her risk of heart attack and stroke.   Depression with Emotional Eating Behaviors Darlene [name]'s mood is stable on medications, and no  signs of serotonin syndrome. She is hopeful about her future with metastatic breast cancer. Darlene [name] struggles with emotional eating and using food for comfort to the extent that it is negatively impacting her health. She often snacks when she is not hungry. Darlene [name] sometimes feels she is out of control and then feels guilty that she made poor food choices. She has been working on behavior modification techniques to help reduce her emotional eating and has been somewhat successful. She shows no sign of suicidal or homicidal ideations.  ASSESSMENT AND PLAN:  Class 1 obesity with serious comorbidity and body mass index (BMI) of 31.0 to 31.9 in adult, unspecified obesity type  Other depression - with emotional eating - Plan: buPROPion (WELLBUTRIN SR) 200 MG 12 hr tablet, topiramate (TOPAMAX) 25 MG tablet, vortioxetine HBr (TRINTELLIX) 20 MG TABS tablet  Type 2 diabetes mellitus with diabetic nephropathy, with long-term current use of insulin ([place]) - Plan: glucose blood (ONETOUCH VERIO) test strip, OneTouch Delica Lancets 93T MISC  Type 2 diabetes mellitus with diabetic neuropathy, with long-term current use of insulin ([place]) - Plan: Insulin Glargine (LANTUS SOLOSTAR) 100 UNIT/ML Solostar Pen, Insulin Pen Needle (BD PEN NEEDLE NANO U/F) 32G X 4 MM MISC, liraglutide (VICTOZA) 18 MG/3ML SOPN, metFORMIN (GLUCOPHAGE) 500 MG tablet  Essential hypertension - Plan: losartan (COZAAR) 100 MG tablet  PLAN:  Diabetes II Darlene [name] has been given extensive diabetes education by myself today including ideal fasting and post-prandial blood glucose readings, individual ideal HgA1c goals  and hypoglycemia prevention. We discussed the importance of good blood  sugar control to decrease the likelihood of diabetic complications such as nephropathy, neuropathy, limb loss, blindness, coronary artery disease, and death. We discussed the importance of intensive lifestyle modification including diet, exercise and weight loss as  the first line treatment for diabetes. Darlene [name] agrees to continue Lantus 12 units SubQ daily #5 pens and we will refill for 1 month, Victoza 1.8 mg SubQ AM #3 pens and we will refill for 1 month, and metformin 500 mg q daily #30 and we will refill for 1 month; We will refill pen needles #100 with no refills, test strips #100 with no refills, and lancets #100 with no refills. [name] agrees to follow up with our clinic in 3 weeks.  Hypertension We discussed sodium restriction, working on healthy weight loss, and a regular exercise program as the means to achieve improved blood pressure control. Darlene [name] agreed with this plan and agreed to follow up as directed. We will continue to monitor her blood pressure as well as her progress with the above lifestyle modifications. Darlene [name] agrees to continue taking Cozaar 100 mg q daily #30 and we will refill for 1 month. She will watch for signs of hypotension as she continues her lifestyle modifications. Darlene [name] agrees to follow up with our clinic in 3 weeks.  Depression with Emotional Eating Behaviors We discussed behavior modification techniques today to help Darlene [name] deal with her emotional eating and depression. Darlene [name] agrees to continue taking Wellbutrin SR 200 mg q daily #30 and we will refill for 1 month, Trintellix 20 mg qhs at bedtime #30 and we will refill for 1 month, and Topamax 25 mg q daily #30 and we will refill for 1 month. Darlene [name] agrees to follow up with our clinic in 3 weeks.  Obesity Darlene [name] is currently in the action stage of change. As such, her goal is to continue with weight loss efforts She has agreed to keep a food journal with 1300-1600 calories and 80+ grams of protein daily Darlene [name] has been instructed to work up to a goal of 150 minutes of combined cardio and strengthening exercise per week for weight loss and overall health benefits. We discussed the following Behavioral Modification Strategies today: increasing vegetables,  increase H20 intake, decrease ETOH, and no skipping meals   Darlene [name] has agreed to follow up with our clinic in 3 weeks. She was informed of the importance of frequent follow up visits to maximize her success with intensive lifestyle modifications for her multiple health conditions.  ALLERGIES: Allergies  Allergen Reactions  . Nsaids Other (See Comments)    H/o gastric bypass - avoid NSAIDs  . Tape Hives, Rash and Other (See Comments)    Adhesives in bandaids    MEDICATIONS: Current Outpatient Medications on File Prior to Visit  Medication Sig Dispense Refill  . acetaminophen (TYLENOL) 325 MG tablet Take 650 mg by mouth 2 (two) times daily as needed for moderate pain or headache.     . Blood Glucose Monitoring Suppl (ONETOUCH VERIO FLEX SYSTEM) w/Device KIT 1 kit by Does not apply route 4 (four) times daily. Test blood sugars four times daily 1 kit 0  . CALCIUM CITRATE-VITAMIN D PO Take 1 tablet by mouth 3 (three) times daily.     . cholecalciferol (VITAMIN D) 1000 units tablet Take 1,000 Units by mouth daily.    . cholestyramine (QUESTRAN) 4 g packet Take 1 packet (4 g total) by mouth 2 (two) times daily as needed. 10 each 12  . Cyanocobalamin (VITAMIN B-12 PO) Take 1  tablet by mouth every 14 (fourteen) days.    . diphenhydrAMINE (BENADRYL) 25 MG tablet Take 25 mg by mouth daily as needed for allergies or sleep.     . DUREZOL 0.05 % EMUL     . loratadine (CLARITIN) 10 MG tablet Take 10 mg by mouth as needed for allergies.    [name] ofloxacin (OCUFLOX) 0.3 % ophthalmic solution     . Pediatric Multivitamins-Iron (FLINTSTONES PLUS IRON) chewable tablet Chew 1 tablet by mouth 2 (two) times daily.    . Probiotic Product ([employer]S PROBIOTIC PO) Take 1 capsule by mouth daily.     . prochlorperazine (COMPAZINE) 10 MG tablet Take 1 tablet (10 mg total) by mouth every 6 (six) hours as needed for nausea or vomiting. 30 tablet 0  . Propylene Glycol (SYSTANE BALANCE OP) Place 1-2 drops into both eyes 3  (three) times daily as needed (for dry eyes).     [name] senna-docusate (SENNA PLUS) 8.6-50 MG tablet Take 1 tablet by mouth daily as needed for moderate constipation.     [name] tobramycin (TOBREX) 0.3 % ophthalmic solution      No current facility-administered medications on file prior to visit.     PAST MEDICAL HISTORY: Past Medical History:  Diagnosis Date  . Anemia    hx of - during 1st round chemo  . Anxiety   . Arthritis    in fingers, shoulders  . Back pain   . Balance problem   . Breast cancer ([place])   . Breast cancer of upper-outer quadrant of right female breast ([place]) 07/22/2014  . Bunion, right foot   . Clumsiness   . Constipation   . Depression   . Diabetes mellitus without complication ([place])    16+ years  . Diabetic retinopathy ([place])    torn retina  . Eating disorder    binge eating  . Epistaxis 08/26/2014  . Fatty liver   . HCAP (healthcare-associated pneumonia) 12/18/2014  . Headache   . Heart murmur    never had any problems  . History of radiation therapy 04/05/15-05/19/15   right chest wall was treated to 50.4 Gy in 28 fractions, right mastectomy scar was treated to 10 Gy in 5 fractions  . Hyperlipidemia   . Hypertension   . Joint pain   . Multiple food allergies   . Neuromuscular disorder ([place])    diabetic neuropathy  . Obesity   . Obstructive sleep apnea on CPAP    uses cpap nightly  . Ovarian cyst rupture    possible  . Pneumonia   . Pneumonia 11/2014  . Radiation 04/05/15-05/19/15   right chest wall 50.4 Gy, mastectomy scar 10 Gy  . Sleep apnea   . Thyroid nodule 06/2014    PAST SURGICAL HISTORY: Past Surgical History:  Procedure Laterality Date  . APPENDECTOMY    . BIOPSY THYROID Bilateral 06/2014   2 nodules - Benign   . BREAST LUMPECTOMY WITH RADIOACTIVE SEED AND SENTINEL LYMPH NODE BIOPSY Right 12/27/2014   Procedure: BREAST LUMPECTOMY WITH RADIOACTIVE SEED AND SENTINEL LYMPH NODE BIOPSY;  Surgeon: Stark Klein, MD;  Location: [facility];  Service: General;  Laterality: Right;  . BREAST REDUCTION SURGERY Bilateral 01/06/2015   Procedure: BILATERAL BREAST REDUCTION, RIGHT ONCOPLASTIC RECONSTRUCTION),LEFT BREAST REDUCTION FOR SYMMETRY;  Surgeon: [name], MD;  Location: [place];  Service: Plastics;  Laterality: Bilateral;  . BREATH TEK H PYLORI N/A 09/15/2012   Procedure: BREATH TEK H PYLORI;  Surgeon: Pedro Earls,  MD;  Location: [facility];  Service: General;  Laterality: N/A;  . BUNIONECTOMY Left 12/2013  . CARPAL TUNNEL RELEASE Right   . CARPAL TUNNEL RELEASE Left   . CATARACT EXTRACTION     6/19  . COLONOSCOPY    . DUPUYTREN CONTRACTURE RELEASE    . GASTRIC ROUX-EN-Y N/A 11/02/2012   Procedure: LAPAROSCOPIC ROUX-EN-Y GASTRIC;  Surgeon: Pedro Earls, MD;  Location: [facility];  Service: General;  Laterality: N/A;  . IR GENERIC HISTORICAL  03/18/2016   IR US GUIDE VASC ACCESS LEFT 03/18/2016 Markus Daft, MD [facility]  . IR GENERIC HISTORICAL  03/18/2016   IR FLUORO GUIDE CV LINE LEFT 03/18/2016 Markus Daft, MD [facility]  . LAPAROSCOPIC APPENDECTOMY N/A 07/22/2015   Procedure: APPENDECTOMY LAPAROSCOPIC;  Surgeon: Michael Boston, MD;  Location: [facility];  Service: General;  Laterality: N/A;  . MASTECTOMY Right 02/15/2015   modified  . MODIFIED MASTECTOMY Right 02/15/2015   Procedure: RIGHT MODIFIED RADICAL MASTECTOMY;  Surgeon: Stark Klein, MD;  Location: [place];  Service: General;  Laterality: Right;  . PORT-A-CATH REMOVAL Left 10/10/2015   Procedure: PORT REMOVAL;  Surgeon: Stark Klein, MD;  Location: [place];  Service: General;  Laterality: Left;  . PORTACATH PLACEMENT Left 08/02/2014   Procedure: INSERTION PORT-A-CATH;  Surgeon: Stark Klein, MD;  Location: [place];  Service: General;  Laterality: Left;  . PORTACATH PLACEMENT N/A 11/05/2016   Procedure: INSERTION PORT-A-CATH;  Surgeon: Stark Klein, MD;  Location: [place];  Service: General;  Laterality: N/A;  . RETINAL LASER PROCEDURE      retina tear on left eye  . ROTATOR CUFF REPAIR Right 2005  . SCAR REVISION Right 12/08/2015   Procedure: COMPLEX REPAIR RIGHT CHEST 10-15CM;  Surgeon: [name], MD;  Location: [place];  Service: Plastics;  Laterality: Right;  . TOE SURGERY  1970s   bone spur  . TRIGGER FINGER RELEASE     x3    SOCIAL HISTORY: Social History   Tobacco Use  . Smoking status: Never Smoker  . Smokeless tobacco: Never Used  Substance Use Topics  . Alcohol use: Yes    Alcohol/week: 1.0 - 2.0 standard drinks    Types: 1 - 2 Standard drinks or equivalent per week    Comment: social  . Drug use: No    FAMILY HISTORY: Family History  Problem Relation Age of Onset  . CVA Mother   . Heart disease Mother   . Sudden death Mother   . Diabetes Father   . Heart failure Father   . Hypertension Father   . Kidney disease Father   . Obesity Father   . Hypertension Sister   . Diabetes Brother   . Breast cancer Paternal Grandmother 48  . Diabetes Paternal Uncle   . Ovarian cancer Neg Hx     ROS: Review of Systems  Constitutional: Positive for weight loss.  Cardiovascular: Negative for chest pain.  Neurological: Negative for dizziness and headaches.  Psychiatric/Behavioral: Positive for depression. Negative for suicidal ideas.    PHYSICAL EXAM: Pt in no acute distress  RECENT LABS AND TESTS: BMET    Component Value Date/Time   NA 142 09/15/2018 0855   NA 143 03/11/2017 0841   NA 140 01/07/2017 0758   K 3.9 09/15/2018 0855   K 4.1 01/07/2017 0758   CL 113 (H) 09/15/2018 0855   CO2 20 (L) 09/15/2018 0855   CO2 24 01/07/2017 0758   GLUCOSE 134 (H) 09/15/2018  0855   GLUCOSE 134 01/07/2017 0758   BUN 33 (H) 09/15/2018 0855   BUN 40 (H) 03/11/2017 0841   BUN 33.6 (H) 01/07/2017 0758   CREATININE 1.83 (H) 09/15/2018 0855   CREATININE 1.62 (H) 04/28/2018 0915   CREATININE 1.5 (H) 01/07/2017 0758   CALCIUM 8.8 (L) 09/15/2018 0855   CALCIUM 9.3 01/07/2017 0758    GFRNONAA 28 (L) 09/15/2018 0855   GFRNONAA 33 (L) 04/28/2018 0915   GFRNONAA 55 (L) 02/18/2013 0827   GFRAA 33 (L) 09/15/2018 0855   GFRAA 38 (L) 04/28/2018 0915   GFRAA 63 02/18/2013 0827   Lab Results  Component Value Date   HGBA1C 5.4 08/27/2018   HGBA1C 5.8 (H) 09/03/2017   HGBA1C 6.1 (H) 03/11/2017   HGBA1C 8.2 (H) 11/05/2016   HGBA1C 8.1 (H) 08/09/2016   Lab Results  Component Value Date   INSULIN 15.3 03/11/2017   INSULIN 18.3 08/09/2016   CBC    Component Value Date/Time   WBC 3.7 (L) 09/15/2018 0855   RBC 3.18 (L) 09/15/2018 0855   HGB 10.1 (L) 09/15/2018 0855   HGB 10.6 (L) 12/16/2017 0806   HGB 10.8 (L) 01/07/2017 0758   HCT 30.9 (L) 09/15/2018 0855   HCT 31.9 (L) 01/07/2017 0758   PLT 130 (L) 09/15/2018 0855   PLT 83 (L) 12/16/2017 0806   PLT 126 (L) 01/07/2017 0758   MCV 97.2 09/15/2018 0855   MCV 90.8 01/07/2017 0758   MCH 31.8 09/15/2018 0855   MCHC 32.7 09/15/2018 0855   RDW 14.5 09/15/2018 0855   RDW 14.8 (H) 01/07/2017 0758   LYMPHSABS 0.8 09/15/2018 0855   LYMPHSABS 1.1 01/07/2017 0758   MONOABS 0.4 09/15/2018 0855   MONOABS 0.3 01/07/2017 0758   EOSABS 0.1 09/15/2018 0855   EOSABS 0.1 01/07/2017 0758   EOSABS 0.1 08/09/2016 1229   BASOSABS 0.0 09/15/2018 0855   BASOSABS 0.0 01/07/2017 0758   Iron/TIBC/Ferritin/ %Sat    Component Value Date/Time   IRON 58 02/18/2013 0827   TIBC 305 02/18/2013 0827   FERRITIN 133 10/31/2014 0816   IRONPCTSAT 19 (L) 02/18/2013 0827   Lipid Panel     Component Value Date/Time   CHOL 137 08/27/2018 0814   TRIG 56 08/27/2018 0814   HDL 73 08/27/2018 0814   CHOLHDL 2.9 02/18/2013 0827   VLDL 23 02/18/2013 0827   LDLCALC 53 08/27/2018 0814   Hepatic Function Panel     Component Value Date/Time   PROT 6.5 09/15/2018 0855   PROT 6.9 03/11/2017 0841   PROT 6.7 01/07/2017 0758   ALBUMIN 3.9 09/15/2018 0855   ALBUMIN 4.5 03/11/2017 0841   ALBUMIN 3.6 01/07/2017 0758   AST 33 09/15/2018 0855   AST 43  (H) 04/28/2018 0915   AST 51 (H) 01/07/2017 0758   ALT 44 09/15/2018 0855   ALT 61 (H) 04/28/2018 0915   ALT 82 (H) 01/07/2017 0758   ALKPHOS 94 09/15/2018 0855   ALKPHOS 115 01/07/2017 0758   BILITOT 0.6 09/15/2018 0855   BILITOT 0.6 04/28/2018 0915   BILITOT 0.72 01/07/2017 0758      Component Value Date/Time   TSH 1.54 02/07/2017   TSH 2.170 08/09/2016 1229      I, [name], am acting as transcriptionist for Dennard Nip, MD I have reviewed the above documentation for accuracy and completeness, and I agree with the above. -Dennard Nip, MD

## 2018-09-29 ENCOUNTER — Encounter (INDEPENDENT_AMBULATORY_CARE_PROVIDER_SITE_OTHER): Payer: Self-pay | Admitting: Family Medicine

## 2018-10-02 ENCOUNTER — Encounter: Payer: Self-pay | Admitting: Neurology

## 2018-10-05 ENCOUNTER — Ambulatory Visit (INDEPENDENT_AMBULATORY_CARE_PROVIDER_SITE_OTHER): Payer: Medicare Other | Admitting: Neurology

## 2018-10-05 ENCOUNTER — Other Ambulatory Visit: Payer: Self-pay

## 2018-10-05 ENCOUNTER — Encounter: Payer: Self-pay | Admitting: Neurology

## 2018-10-05 VITALS — BP 135/66 | HR 63 | Temp 98.4°F | Ht 69.0 in | Wt 195.0 lb

## 2018-10-05 DIAGNOSIS — C50319 Malignant neoplasm of lower-inner quadrant of unspecified female breast: Secondary | ICD-10-CM

## 2018-10-05 DIAGNOSIS — T451X5A Adverse effect of antineoplastic and immunosuppressive drugs, initial encounter: Secondary | ICD-10-CM

## 2018-10-05 DIAGNOSIS — I700 Atherosclerosis of aorta: Secondary | ICD-10-CM

## 2018-10-05 DIAGNOSIS — G4733 Obstructive sleep apnea (adult) (pediatric): Secondary | ICD-10-CM

## 2018-10-05 DIAGNOSIS — G620 Drug-induced polyneuropathy: Secondary | ICD-10-CM

## 2018-10-05 DIAGNOSIS — Z9989 Dependence on other enabling machines and devices: Secondary | ICD-10-CM

## 2018-10-05 DIAGNOSIS — Z170 Estrogen receptor positive status [ER+]: Secondary | ICD-10-CM

## 2018-10-05 NOTE — Progress Notes (Addendum)
[facility] SLEEP MEDICINE CLINIC   Provider:  Larey Seat, MD  Referring Provider: Vernie Shanks, MD Primary Care Physician:  Starlyn Skeans, MD  Chief Complaint  Patient presents with  . Follow-up    pt alone, rm 11. pt states that the recent mask change has not been helpful. She states that her mouth drops     Interval history 10-05-2018, I have the pleasure of meeting with [name] today who prefers to go [place], she is a meanwhile 68 year old Caucasian right-handed female and has been followed in our clinic for over 5 years.  Her primary care provider is Dr. Aneta Mins, and she also followed [facility] program.  She has lost a significant amount of weight.  Current weight is 195 pounds.  Patient has completed chemotherapy and is sleeping well. She has some trouble with the mask fit and oral air leak- her head straps interfered with patchy hair regrowth.. Her fatigue severity score was endorsed at 23 points and the Epworth sleepiness score at 4 points, the geriatric depression score was 1 out of 15 points.  Overall she describes her sleep as better but there have been some issues with the mask fit and we have changed the mask in the past. She has DM, had retinopathy, and follows Dr Jana Hakim.  She had to pay 45.000 usd for her therapies. [name]'s last echocardiogram on 09/10/2018, showed an ejection fraction in the 55% - 60% range.  She underwent a right foot xray on 08/11/2018 showing: Significant bunion deformities present.  No evidence of acute fracture. She also underwent a liver MRI on 09/14/18 showing: Stable treated metastasis in the RIGHT hepatic lobe without evidence of progression. No new hepatic lesions present. Stable nodular enlargement of the LEFT adrenal gland which was not hypermetabolic on comparison PET-CT scans.       interval history from 09-29-2017,  The patient is a breast cancer survivor with recently  new discovered hepatic metastatic. She is still on Receptin.  She went on [place]- her company went bankrupt. She is worried about 90K of medical bills that went unpaid by her employer.  She is 100% compliant user of auto CPAP her machine is an S9 machine setting between 6 and 12 cmH2O pressure with 3 cm expiratory pressure relief and a residual AHI of 0.8.  The 95th percentile pressure is 9.7 cm which is just covered with in her AutoSet limits.  The residual apneas are obstructive in nature, there is no evidence of Cheyne-Stokes respiration arising, she does not have major air leaks.  I do not need to make any changes to her settings but will write for supplies for her CPAP machine today.  Mrs. Tollie Pizza also endorsed the Epworth Sleepiness Scale at 5 points today and the fatigue severity score at 24 out of 63 possible points.  She endorsed the geriatric depression score at 1 out of 15 points   Interval history from 09/26/2016, I have the pleasure of seeing Mrs. Dahan in a RV. She has just been told that she still has a breast cancerous lesion under the right collarbone and is planning on radiation therapy and Receptin infusion therapy.  She has been very compliant CPAP user for the last 3 years, her 2018 data show again 100% compliance with an average user time of 7 hours and 49 minutes, she is using an AutoSet between 6 and 12 cm water pressure with an EPR 3 cm, residual AHI is  1.3. She has already new supplies at home and just needs to switch her mask.  Her Epworth sleepiness score was endorsed at 5 points( which is low), her fatigue severity is also 25 points and low, she endorsed 4 out of 15 points on the geriatric depression score. She works for [employer] in [employer] and her company has declared bankruptcy-  And fears her healthcare insurance is in jeopardy.     ANAIA FRITH is a 68 y.o. female, retired , married to a retired professor from  [employer]. Is seen here as a 2 year  revisit from Dr. Noah Delaine for a CPAP compliance visit , the patient has breast cancer .  Mrs. Merida was evaluated and diagnosed with severe sleep apnea at an AHI of 56 at the [facility] in 2008. Her sleep study was "split" and she was titrated to CPAP.  She had used a full face mask , had a re titration study on 7-12- 2013- this time at [facility] sleep upon Dr Chales Abrahams referral .  the study confirmed obstructive sleep apnea but with a lower AHI of 16.3 and in addition with multiple periodic limb movements that are aroused the patient about 4.4 times per hour of sleep. her sleep study also documented an oxygen nadir of 83% with 82.2 minutes of desaturations at all below 90%.  She was sent home with an auto titrator,  since she just missed the SPLIT  qualifying  criteria.  She uses a nasal pillow.  She had reported that she continued to kick her husband and her sleep. The auto titration revealed best results at 14 cm water with 3 cm EPR - but she still endorsed an Epworth sleepiness score of 16 points in August 2013- After using an auto titrating machine. Pre study Epworth sleepiness score was 21/24 points. In October 2014 she underwent a Roux and Y weight loss surgery upon recommendation of her internist and endocrinologist . She lost 65 pounds since and has gained stamina and day time alertness back.  She feels motivated and participates in a special exercise program at [facility]. The patient works for a Hotel manager site , Garment/textile technologist . She rises at 6 AM, wakes spontaneously, feels refreshed, not a caffeine drinker. No sodas. Driving to work for 30 minutes in [place], beginning at 8 AM and 5 PM . No naps, 10 PM is bedtime , falls asleep promptly . compliance data showed that the patient is as per CPAP machine 7 hours and 47 minutes at night on average the residual AHI is 1.3 the machine is set at 12 cm water pressure now was 2 cm EPR. She does still have a high air leak. She is using a nasal  pillow now. She has a high air leak daily and  probably needs to be refitted. This may be due to weight loss. sleepiness score was endorsed at 6 point and fatigue severity at 15 points, the geriatric depression scale was endorsed at 2 points.   12-08-13: Mrs. Tollie Pizza is on an AutoSet machine with a pressure window between 6 and 12 cm water and 3 cm EPR the 95% for pressure is 11.6 cm. Currently the machine is basically used  at 12 cm water. The patient has an AHI of 1.0, her air leak is moderate. Epworth 4,  GDS zero, FSS 12 .  06-08-14; This is a yearly interval history for Mrs. Tollie Pizza, actually saw 6 months ago. Mrs. Tollie Pizza CPAP memory chip was corrupted  and didn't bring forth any data for today's visit,  but we have the previous 90 days  from prior. She has an 80% compliance for over 4 hours of daily use an average user time of 5 hours 57 minutes her machine is an AutoSet with a minimum pressure of 6 maximum pressure of 12 EPR level of 3 cm water the AutoSet indicated that the 91st percentile pressure is 10.9 and in my opinion she does not have to set her machine to any specific pressure since the 95th percentile pressure is still within her window. She is using the machine compliantly her Epworth sleepiness score was endorsed at 3 points and her fatigue severity score at only 9 points which are excellent results. She does have occasional air leaks but these are in moderate range. She sleeps between 7 nd 8 hours.  She uses a nasal pillow mask. Sometimes the mask gets dislodged but not often and sometimes she has nasal rhinitis or congestion and will need to take a break from CPAP use for a couple of days until she recovers. She reports that she has often gotten entangled and she likes to sleep on her side area to for this reason I showed her the dream ware mask by [employer] and I think it would be a good choice for her to try. She should be able to exchange to this kind of mask after 90 days  when her time off replacing the other one would come up.  Interval history from 02/08/2015, I'm having the pleasure to see Mrs. Tollie Pizza today again for a compliance visit on CPAP. Unfortunately she received rather bad news late in 2016 that her breast cancer had returned and that her previously reconstructed breast now would just need a full mastectomy. She is not in pain she has regrown scalp hair after eating her last chemotherapy. We were able to obtain a compliance report today in office she has 100% compliance for the last 30 days over 4 hours of use. 8 hours and 50 minutes on average she is using an AutoSet between 6 and 12 cm water with full-time EPR 3 cm water. Her AHI is 0.5 Partly be improved upon. Her 95th percentile pressure is 8.6. She has no significant air leaks, the pressure doesn't very much from night tonight she has not developed excessive daytime sleepiness and she has a normal degree of fatigue.   Interval history from 03/26/2016, I have the pleasure of seeing Mrs. Tollie Pizza today again in a yearly revisit she has remained an extremely compliant CPAP user with 100% compliance with an average user time of 8 hours and 39 minutes at night, using an AutoSet between 6 and 12 cm water pressure with 3 cm expiratory pressure relief. Residual AHI is 1.8 Resolution. The 91st percentile pressure is at 10.3 cm water she does have minor air leaks. Her Epworth sleepiness score is endorsed at 5 points her fatigue severity at only 13 points this is especially surprising to me as she has had a recurrence of a malignant breast cancer having undergone a mastectomy in 2017, she was just after Christmas diagnosed with a small nodular 2 more sitting under the right pectoralis muscle. Paternal grandmother had breast cancer.   Review of Systems: Out of a complete 14 system review, the patient complains of only the following symptoms, and all other reviewed systems are negative.  She remains fully employed at a  [employer] month from retirement.  Losing weight , status  post gastric Bypass, 202 from 292 pounds.  Mrs. Tollie Pizza also endorsed the Epworth Sleepiness Scale at 5 points today and the fatigue severity score at 24 out of 63 possible points.  She endorsed the geriatric depression score at 1 out of 15 points. Bariatric surgery by Dr. Johnathan Hausen MD in [place].   Social History   Socioeconomic History  . Marital status: Married    Spouse name: [name]  . Number of children: 0  . Years of education: Bachelor's  . Highest education level: Not on file  Occupational History  . Occupation: Pensions consultant: [employer]  . Financial resource strain: Not on file  . Food insecurity    Worry: Not on file    Inability: Not on file  . Transportation needs    Medical: Not on file    Non-medical: Not on file  Tobacco Use  . Smoking status: Never Smoker  . Smokeless tobacco: Never Used  Substance and Sexual Activity  . Alcohol use: Yes    Alcohol/week: 1.0 - 2.0 standard drinks    Types: 1 - 2 Standard drinks or equivalent per week    Comment: social  . Drug use: No  . Sexual activity: Yes    Partners: Male    Birth control/protection: Post-menopausal, Other-see comments    Comment: husband vasectomy  Lifestyle  . Physical activity    Days per week: Not on file    Minutes per session: Not on file  . Stress: Not on file  Relationships  . Social Herbalist on phone: Not on file    Gets together: Not on file    Attends religious service: Not on file    Active member of club or organization: Not on file    Attends meetings of clubs or organizations: Not on file    Relationship status: Not on file  . Intimate partner violence    Fear of current or ex partner: Not on file    Emotionally abused: Not on file    Physically abused: Not on file    Forced sexual activity: Not on file  Other Topics Concern  . Not on file  Social History  Narrative   Patient is married [name]) and lives at home with her husband.   Patient does not have any children.   Patient is working for a [employer].   Patient has a Bachelor's degree.   Patient is right-handed.   Patient does not drink any caffeine.    Family History  Problem Relation Age of Onset  . CVA Mother   . Heart disease Mother   . Sudden death Mother   . Diabetes Father   . Heart failure Father   . Hypertension Father   . Kidney disease Father   . Obesity Father   . Hypertension Sister   . Diabetes Brother   . Breast cancer Paternal Grandmother 81  . Diabetes Paternal Uncle   . Ovarian cancer Neg Hx     Past Medical History:  Diagnosis Date  . Anemia    hx of - during 1st round chemo  . Anxiety   . Arthritis    in fingers, shoulders  . Back pain   . Balance problem   . Breast cancer ([place])   . Breast cancer of upper-outer quadrant of right female breast ([place]) 07/22/2014  . Bunion, right foot   . Clumsiness   . Constipation   .  Depression   . Diabetes mellitus without complication ([place])    83+ years  . Diabetic retinopathy ([place])    torn retina  . Eating disorder    binge eating  . Epistaxis 08/26/2014  . Fatty liver   . HCAP (healthcare-associated pneumonia) 12/18/2014  . Headache   . Heart murmur    never had any problems  . History of radiation therapy 04/05/15-05/19/15   right chest wall was treated to 50.4 Gy in 28 fractions, right mastectomy scar was treated to 10 Gy in 5 fractions  . Hyperlipidemia   . Hypertension   . Joint pain   . Multiple food allergies   . Neuromuscular disorder ([place])    diabetic neuropathy  . Obesity   . Obstructive sleep apnea on CPAP    uses cpap nightly  . Ovarian cyst rupture    possible  . Pneumonia   . Pneumonia 11/2014  . Radiation 04/05/15-05/19/15   right chest wall 50.4 Gy, mastectomy scar 10 Gy  . Sleep apnea   . Thyroid nodule 06/2014    Past Surgical History:  Procedure Laterality Date  .  APPENDECTOMY    . BIOPSY THYROID Bilateral 06/2014   2 nodules - Benign   . BREAST LUMPECTOMY WITH RADIOACTIVE SEED AND SENTINEL LYMPH NODE BIOPSY Right 12/27/2014   Procedure: BREAST LUMPECTOMY WITH RADIOACTIVE SEED AND SENTINEL LYMPH NODE BIOPSY;  Surgeon: Stark Klein, MD;  Location: [facility];  Service: General;  Laterality: Right;  . BREAST REDUCTION SURGERY Bilateral 01/06/2015   Procedure: BILATERAL BREAST REDUCTION, RIGHT ONCOPLASTIC RECONSTRUCTION),LEFT BREAST REDUCTION FOR SYMMETRY;  Surgeon: [name], MD;  Location: [facility];  Service: Plastics;  Laterality: Bilateral;  . BREATH TEK H PYLORI N/A 09/15/2012   Procedure: BREATH TEK H PYLORI;  Surgeon: Pedro Earls, MD;  Location: [facility];  Service: General;  Laterality: N/A;  . BUNIONECTOMY Left 12/2013  . CARPAL TUNNEL RELEASE Right   . CARPAL TUNNEL RELEASE Left   . CATARACT EXTRACTION     6/19  . COLONOSCOPY    . DUPUYTREN CONTRACTURE RELEASE    . GASTRIC ROUX-EN-Y N/A 11/02/2012   Procedure: LAPAROSCOPIC ROUX-EN-Y GASTRIC;  Surgeon: Pedro Earls, MD;  Location: [facility];  Service: General;  Laterality: N/A;  . IR GENERIC HISTORICAL  03/18/2016   IR US GUIDE VASC ACCESS LEFT 03/18/2016 Markus Daft, MD [facility]  . IR GENERIC HISTORICAL  03/18/2016   IR FLUORO GUIDE CV LINE LEFT 03/18/2016 Markus Daft, MD [facility]  . LAPAROSCOPIC APPENDECTOMY N/A 07/22/2015   Procedure: APPENDECTOMY LAPAROSCOPIC;  Surgeon: Michael Boston, MD;  Location: [facility];  Service: General;  Laterality: N/A;  . MASTECTOMY Right 02/15/2015   modified  . MODIFIED MASTECTOMY Right 02/15/2015   Procedure: RIGHT MODIFIED RADICAL MASTECTOMY;  Surgeon: Stark Klein, MD;  Location: [facility];  Service: General;  Laterality: Right;  . PORT-A-CATH REMOVAL Left 10/10/2015   Procedure: PORT REMOVAL;  Surgeon: Stark Klein, MD;  Location: [facility];  Service: General;  Laterality: Left;  . PORTACATH PLACEMENT Left 08/02/2014   Procedure: INSERTION  PORT-A-CATH;  Surgeon: Stark Klein, MD;  Location: [facility];  Service: General;  Laterality: Left;  . PORTACATH PLACEMENT N/A 11/05/2016   Procedure: INSERTION PORT-A-CATH;  Surgeon: Stark Klein, MD;  Location: [facility];  Service: General;  Laterality: N/A;  . RETINAL LASER PROCEDURE     retina tear on left eye  . ROTATOR CUFF REPAIR Right 2005  . SCAR REVISION Right  12/08/2015   Procedure: COMPLEX REPAIR RIGHT CHEST 10-15CM;  Surgeon: [name], MD;  Location: [facility];  Service: Plastics;  Laterality: Right;  . TOE SURGERY  1970s   bone spur  . TRIGGER FINGER RELEASE     x3    Current Outpatient Medications  Medication Sig Dispense Refill  . acetaminophen (TYLENOL) 325 MG tablet Take 650 mg by mouth 2 (two) times daily as needed for moderate pain or headache.     . Blood Glucose Monitoring Suppl (ONETOUCH VERIO FLEX SYSTEM) w/Device KIT 1 kit by Does not apply route 4 (four) times daily. Test blood sugars four times daily 1 kit 0  . buPROPion (WELLBUTRIN SR) 200 MG 12 hr tablet TAKE 1 TABLET BY MOUTH DAILY AT 12 NOON. 30 tablet 0  . CALCIUM CITRATE-VITAMIN D PO Take 1 tablet by mouth 3 (three) times daily.     . cholecalciferol (VITAMIN D) 1000 units tablet Take 1,000 Units by mouth daily.    . cholestyramine (QUESTRAN) 4 g packet Take 1 packet (4 g total) by mouth 2 (two) times daily as needed. 10 each 12  . Cyanocobalamin (VITAMIN B-12 PO) Take 1 tablet by mouth every 14 (fourteen) days.    . diphenhydrAMINE (BENADRYL) 25 MG tablet Take 25 mg by mouth daily as needed for allergies or sleep.     . DUREZOL 0.05 % EMUL     . glucose blood (ONETOUCH VERIO) test strip Use as instructed 100 each 0  . Insulin Glargine (LANTUS SOLOSTAR) 100 UNIT/ML Solostar Pen Inject 12 Units into the skin daily. 5 pen 0  . Insulin Pen Needle (BD PEN NEEDLE NANO U/F) 32G X 4 MM MISC 1 Package by Does not apply route daily at 12 noon. 100 each 0  . liraglutide  (VICTOZA) 18 MG/3ML SOPN INJECT 1.8MG(0.3 MLS) INTO THE SKIN EVERY MORNING 3 pen 0  . loratadine (CLARITIN) 10 MG tablet Take 10 mg by mouth as needed for allergies.    [name] losartan (COZAAR) 100 MG tablet Take 1 tablet (100 mg total) by mouth daily. 30 tablet 0  . metFORMIN (GLUCOPHAGE) 500 MG tablet Take 1 tablet (500 mg total) by mouth daily. 30 tablet 0  . ofloxacin (OCUFLOX) 0.3 % ophthalmic solution     . OneTouch Delica Lancets 17G MISC 1 Package by Does not apply route daily. 100 each 0  . Pediatric Multivitamins-Iron (FLINTSTONES PLUS IRON) chewable tablet Chew 1 tablet by mouth 2 (two) times daily.    . Probiotic Product (CVS PROBIOTIC PO) Take 1 capsule by mouth daily.     . prochlorperazine (COMPAZINE) 10 MG tablet Take 1 tablet (10 mg total) by mouth every 6 (six) hours as needed for nausea or vomiting. 30 tablet 0  . Propylene Glycol (SYSTANE BALANCE OP) Place 1-2 drops into both eyes 3 (three) times daily as needed (for dry eyes).     [name] senna-docusate (SENNA PLUS) 8.6-50 MG tablet Take 1 tablet by mouth daily as needed for moderate constipation.     [name] tobramycin (TOBREX) 0.3 % ophthalmic solution     . topiramate (TOPAMAX) 25 MG tablet Take 1 tablet (25 mg total) by mouth daily. 30 tablet 0  . vortioxetine HBr (TRINTELLIX) 20 MG TABS tablet Take 1 tablet (20 mg total) by mouth at bedtime. 30 tablet 0   No current facility-administered medications for this visit.     Allergies as of 10/05/2018 - Review Complete 10/05/2018  Allergen Reaction Noted  .  Nsaids Other (See Comments) 07/22/2015  . Tape Hives, Rash, and Other (See Comments) 12/18/2014    Vitals: BP 135/66   Pulse 63   Temp 98.4 F (36.9 C)   Ht _0  (1.753 m)   Wt 195 lb (88.5 kg)   LMP 10/22/2007   BMI 28.80 kg/m  Last Weight:  Wt Readings from Last 1 Encounters:  10/05/18 195 lb (88.5 kg)   Last Height:   Ht Readings from Last 1 Encounters:  10/05/18 _1  (1.753 m)    Physical exam:  General: The  patient is awake, alert and appears not in acute distress. The patient is well groomed. She is pale  Head: Normocephalic, atraumatic. Neck is supple. Mallampati 2, neck circumference: 17.4 , no TMJ click , no delayed swallowing , no goiter.  Rhinitis with nasal congestion.  Cardiovascular:  Regular rate and rhythm, Respiratory: Lungs are clear to auscultation. No wheezing.  Skin:  Without evidence of edema, or rash. Went bald after chemo, now 1 inch skalp hair re-growth.  Trunk: BMI was 30 , now 29 .    Neurologic exam : The patient is awake and alert, oriented to place and time.  Memory subjective described as intact.   Cranial nerves: Pupils are equal and briskly reactive to light. Extraocular movements  in vertical and horizontal planes intact and without nystagmus. Visual fields by finger perimetry are intact.Hearing to finger rub intact.  Facial sensation intact to fine touch. Facial motor strength is symmetric and tongue and uvula still  move midline. Motor exam:  Normal tone and normal muscle bulk and symmetric normal strength in all extremities.  Grip weakness has been persistent.  Crepitation over both knees with ROM.  Sensory:  Fine touch, pinprick and vibration in Fingers is normal. No carpal tunnel. Peripheral neuropathy has decreased vibration sense.  Coordination: Rapid alternating movements in the fingers/hands normal.  Finger-to-nose maneuver - no evidence of ataxia, dysmetria or tremor. Gait and station: Patient walks without assistive device.  Assessment:  After physical and neurologic examination, review of laboratory studies, imaging, neurophysiology testing and pre-existing records, assessment is:  15 minute Rv with discussion of chemotherapy induced neuropathy, CPAP for  OSA,  Reduced obesity and breast cancer impact.  More than 50% of this visit were spent in face-to-face time in discussion and coordination of care this other entities including Dr. Gunnar Bulla Margrinat her  oncologist.  1) OSA on CPAP with a very low residual AHI and excellent compliance last year, now we have no data because the [employer] web side was down. Idamae Schuller ewed  Mask (dream wear) order was written, patient requests [place].   2) Breast cancer survivor- status post mastectomy, recently diagnosed with a new liver metastasis.Chemotherapy induced numbness, neuropathy is persistent   3 gastric Bypass surgery in 2014 years ago, lost 100 pounds in the d first 54 month, still had OSA.  Now follows Caren Beasley to reduce hr weight further   Plan:  Treatment plan and additional workup : [name]'s last echocardiogram on 09/10/2018, showed an ejection fraction in the 55% - 60% range.   Since her last visit here, she underwent a right foot xray on 08/11/2018 showing: Significant bunion deformities present.  No evidence of acute fracture.  She also underwent a liver MRI on 09/14/18 showing: Stable treated metastasis in the RIGHT hepatic lobe without evidence of progression. No new hepatic lesions present. Stable nodular enlargement of the LEFT adrenal gland which was not hypermetabolic on comparison  PET-CT scans.  DME is [facility],  nasal  pillow was working well after weight loss.  CPAP auto Settings remain at 6 cm to 12 cm water , 3 cm EP-  DME sends supplies. [facility]   RV with NP in 12  month.   100% compliance for 2016 and 2017, 2018, 2019    Her chemotherapy induced sensory neuropathy is not painful, just causing numbness . She was diagnosed in June with hepatic metastasis, having infusion therapy - loss of platelets. Lost further weight .  She is still pale , fatigued.    Will need to addend when data ready.   Addendum at 11.40 AM-I just received the data for Mrs. Burnett's CPAP download she is 100% compliant patient again with the latest last date of recording being 02 October 2018.  Average user time 7 hours 36 minutes, she is using an AutoSet S9 with a minimum pressure of 6 maximum pressure of  12 cmH2O 3 cm EPR and a residual AHI of only 0.7/h.  Based on this I would not have to change any settings the EPR level may be up for reduction to 1 or 2 cm but the patient has not reported any discomfort with the settings and therefore I will leave it alone.   No central apneas are emerging.  Larey Seat, MD  10-05-2018    Cc Yaakov Guthrie  , MD Cc Dennard Nip, MD

## 2018-10-05 NOTE — Addendum Note (Signed)
Addended by: Larey Seat on: 10/05/2018 10:07 AM   Modules accepted: Orders

## 2018-10-06 ENCOUNTER — Inpatient Hospital Stay: Payer: Medicare Other

## 2018-10-06 ENCOUNTER — Other Ambulatory Visit: Payer: Self-pay

## 2018-10-06 ENCOUNTER — Other Ambulatory Visit: Payer: Self-pay | Admitting: Oncology

## 2018-10-06 ENCOUNTER — Telehealth: Payer: Self-pay | Admitting: *Deleted

## 2018-10-06 ENCOUNTER — Inpatient Hospital Stay: Payer: Medicare Other | Attending: Oncology

## 2018-10-06 VITALS — BP 139/64 | HR 61 | Temp 98.5°F | Resp 16

## 2018-10-06 DIAGNOSIS — C50411 Malignant neoplasm of upper-outer quadrant of right female breast: Secondary | ICD-10-CM

## 2018-10-06 DIAGNOSIS — Z23 Encounter for immunization: Secondary | ICD-10-CM

## 2018-10-06 DIAGNOSIS — Z5112 Encounter for antineoplastic immunotherapy: Secondary | ICD-10-CM | POA: Diagnosis present

## 2018-10-06 DIAGNOSIS — C801 Malignant (primary) neoplasm, unspecified: Secondary | ICD-10-CM

## 2018-10-06 DIAGNOSIS — C787 Secondary malignant neoplasm of liver and intrahepatic bile duct: Secondary | ICD-10-CM

## 2018-10-06 DIAGNOSIS — C50911 Malignant neoplasm of unspecified site of right female breast: Secondary | ICD-10-CM

## 2018-10-06 DIAGNOSIS — Z171 Estrogen receptor negative status [ER-]: Secondary | ICD-10-CM

## 2018-10-06 DIAGNOSIS — Z95828 Presence of other vascular implants and grafts: Secondary | ICD-10-CM

## 2018-10-06 LAB — CBC WITH DIFFERENTIAL/PLATELET
Abs Immature Granulocytes: 0.01 10*3/uL (ref 0.00–0.07)
Basophils Absolute: 0 10*3/uL (ref 0.0–0.1)
Basophils Relative: 0 %
Eosinophils Absolute: 0.1 10*3/uL (ref 0.0–0.5)
Eosinophils Relative: 4 %
HCT: 30.7 % — ABNORMAL LOW (ref 36.0–46.0)
Hemoglobin: 9.9 g/dL — ABNORMAL LOW (ref 12.0–15.0)
Immature Granulocytes: 0 %
Lymphocytes Relative: 20 %
Lymphs Abs: 0.7 10*3/uL (ref 0.7–4.0)
MCH: 31.9 pg (ref 26.0–34.0)
MCHC: 32.2 g/dL (ref 30.0–36.0)
MCV: 99 fL (ref 80.0–100.0)
Monocytes Absolute: 0.3 10*3/uL (ref 0.1–1.0)
Monocytes Relative: 8 %
Neutro Abs: 2.4 10*3/uL (ref 1.7–7.7)
Neutrophils Relative %: 68 %
Platelets: 113 10*3/uL — ABNORMAL LOW (ref 150–400)
RBC: 3.1 MIL/uL — ABNORMAL LOW (ref 3.87–5.11)
RDW: 13.9 % (ref 11.5–15.5)
WBC: 3.6 10*3/uL — ABNORMAL LOW (ref 4.0–10.5)
nRBC: 0 % (ref 0.0–0.2)

## 2018-10-06 LAB — COMPREHENSIVE METABOLIC PANEL
ALT: 60 U/L — ABNORMAL HIGH (ref 0–44)
AST: 38 U/L (ref 15–41)
Albumin: 3.7 g/dL (ref 3.5–5.0)
Alkaline Phosphatase: 99 U/L (ref 38–126)
Anion gap: 6 (ref 5–15)
BUN: 28 mg/dL — ABNORMAL HIGH (ref 8–23)
CO2: 22 mmol/L (ref 22–32)
Calcium: 8.5 mg/dL — ABNORMAL LOW (ref 8.9–10.3)
Chloride: 113 mmol/L — ABNORMAL HIGH (ref 98–111)
Creatinine, Ser: 1.67 mg/dL — ABNORMAL HIGH (ref 0.44–1.00)
GFR calc Af Amer: 37 mL/min — ABNORMAL LOW (ref >60)
GFR calc non Af Amer: 32 mL/min — ABNORMAL LOW (ref >60)
Glucose, Bld: 188 mg/dL — ABNORMAL HIGH (ref 70–99)
Potassium: 3.9 mmol/L (ref 3.5–5.1)
Sodium: 141 mmol/L (ref 135–145)
Total Bilirubin: 0.5 mg/dL (ref 0.3–1.2)
Total Protein: 6.1 g/dL — ABNORMAL LOW (ref 6.5–8.1)

## 2018-10-06 LAB — CEA (IN HOUSE-CHCC): CEA (CHCC-In House): 5.01 ng/mL — ABNORMAL HIGH (ref 0.00–5.00)

## 2018-10-06 MED ORDER — SODIUM CHLORIDE 0.9% FLUSH
10.0000 mL | INTRAVENOUS | Status: DC | PRN
  Administered 2018-10-06: 10 mL via INTRAVENOUS
  Filled 2018-10-06: qty 10

## 2018-10-06 MED ORDER — INFLUENZA VAC A&B SA ADJ QUAD 0.5 ML IM PRSY
PREFILLED_SYRINGE | INTRAMUSCULAR | Status: AC
  Filled 2018-10-06: qty 0.5

## 2018-10-06 MED ORDER — ACETAMINOPHEN 325 MG PO TABS
650.0000 mg | ORAL_TABLET | Freq: Once | ORAL | Status: AC
  Administered 2018-10-06: 650 mg via ORAL

## 2018-10-06 MED ORDER — DIPHENHYDRAMINE HCL 25 MG PO CAPS
ORAL_CAPSULE | ORAL | Status: AC
  Filled 2018-10-06: qty 1

## 2018-10-06 MED ORDER — INFLUENZA VAC A&B SA ADJ QUAD 0.5 ML IM PRSY
0.5000 mL | PREFILLED_SYRINGE | Freq: Once | INTRAMUSCULAR | Status: AC
  Administered 2018-10-06: 0.5 mL via INTRAMUSCULAR

## 2018-10-06 MED ORDER — SODIUM CHLORIDE 0.9 % IV SOLN
Freq: Once | INTRAVENOUS | Status: AC
  Administered 2018-10-06: 09:00:00 via INTRAVENOUS
  Filled 2018-10-06: qty 250

## 2018-10-06 MED ORDER — ACETAMINOPHEN 325 MG PO TABS
ORAL_TABLET | ORAL | Status: AC
  Filled 2018-10-06: qty 2

## 2018-10-06 MED ORDER — DIPHENHYDRAMINE HCL 25 MG PO CAPS
25.0000 mg | ORAL_CAPSULE | Freq: Once | ORAL | Status: AC
  Administered 2018-10-06: 25 mg via ORAL

## 2018-10-06 MED ORDER — HEPARIN SOD (PORK) LOCK FLUSH 100 UNIT/ML IV SOLN
500.0000 [IU] | Freq: Once | INTRAVENOUS | Status: AC | PRN
  Administered 2018-10-06: 500 [IU]
  Filled 2018-10-06: qty 5

## 2018-10-06 MED ORDER — SODIUM CHLORIDE 0.9% FLUSH
10.0000 mL | INTRAVENOUS | Status: DC | PRN
  Administered 2018-10-06: 10 mL
  Filled 2018-10-06: qty 10

## 2018-10-06 MED ORDER — SODIUM CHLORIDE 0.9 % IV SOLN
420.0000 mg | Freq: Once | INTRAVENOUS | Status: AC
  Administered 2018-10-06: 420 mg via INTRAVENOUS
  Filled 2018-10-06: qty 14

## 2018-10-06 MED ORDER — TRASTUZUMAB-ANNS CHEMO 420 MG IV SOLR
6.0000 mg/kg | Freq: Once | INTRAVENOUS | Status: AC
  Administered 2018-10-06: 546 mg via INTRAVENOUS
  Filled 2018-10-06: qty 26

## 2018-10-06 NOTE — Progress Notes (Signed)
Called Darlene [name] and let her know that her CEA is bumping up a bit.  We can repeat it in 3 weeks and if there is a trend she will be restaged

## 2018-10-06 NOTE — Patient Instructions (Signed)
[facility] Discharge Instructions for Patients Receiving Chemotherapy  Today you received the following chemotherapy agents: trastuzumab and pertuzumab.  To help prevent nausea and vomiting after your treatment, we encourage you to take your nausea medication as directed.   If you develop nausea and vomiting that is not controlled by your nausea medication, call the clinic.   BELOW ARE SYMPTOMS THAT SHOULD BE REPORTED IMMEDIATELY:  *FEVER GREATER THAN 100.5 F  *CHILLS WITH OR WITHOUT FEVER  NAUSEA AND VOMITING THAT IS NOT CONTROLLED WITH YOUR NAUSEA MEDICATION  *UNUSUAL SHORTNESS OF BREATH  *UNUSUAL BRUISING OR BLEEDING  TENDERNESS IN MOUTH AND THROAT WITH OR WITHOUT PRESENCE OF ULCERS  *URINARY PROBLEMS  *BOWEL PROBLEMS  UNUSUAL RASH Items with * indicate a potential emergency and should be followed up as soon as possible.  Feel free to call the clinic should you have any questions or concerns. The clinic phone number is [phone].  Please show the CHEMO ALERT CARD at check-in to the Emergency Department and triage nurse.   

## 2018-10-06 NOTE — Telephone Encounter (Signed)
Per MD - no loading dose for new start of perjeta.

## 2018-10-08 ENCOUNTER — Encounter (INDEPENDENT_AMBULATORY_CARE_PROVIDER_SITE_OTHER): Payer: Self-pay | Admitting: Family Medicine

## 2018-10-08 ENCOUNTER — Ambulatory Visit (INDEPENDENT_AMBULATORY_CARE_PROVIDER_SITE_OTHER): Payer: Medicare Other | Admitting: Family Medicine

## 2018-10-08 ENCOUNTER — Other Ambulatory Visit: Payer: Self-pay

## 2018-10-08 VITALS — BP 114/71 | HR 65 | Temp 98.2°F | Ht 69.0 in | Wt 190.0 lb

## 2018-10-08 DIAGNOSIS — Z6830 Body mass index (BMI) 30.0-30.9, adult: Secondary | ICD-10-CM

## 2018-10-08 DIAGNOSIS — E669 Obesity, unspecified: Secondary | ICD-10-CM

## 2018-10-08 DIAGNOSIS — I10 Essential (primary) hypertension: Secondary | ICD-10-CM | POA: Diagnosis not present

## 2018-10-08 DIAGNOSIS — Z794 Long term (current) use of insulin: Secondary | ICD-10-CM

## 2018-10-08 DIAGNOSIS — F3289 Other specified depressive episodes: Secondary | ICD-10-CM

## 2018-10-08 DIAGNOSIS — E1140 Type 2 diabetes mellitus with diabetic neuropathy, unspecified: Secondary | ICD-10-CM | POA: Diagnosis not present

## 2018-10-08 MED ORDER — METFORMIN HCL 500 MG PO TABS: 500.0000 mg | ORAL_TABLET | Freq: Every day | ORAL | 0 refills | Status: DC

## 2018-10-08 MED ORDER — VORTIOXETINE HBR 20 MG PO TABS: 20.0000 mg | ORAL_TABLET | Freq: Every day | ORAL | 0 refills | Status: DC

## 2018-10-08 MED ORDER — LOSARTAN POTASSIUM 100 MG PO TABS: 100.0000 mg | ORAL_TABLET | Freq: Every day | ORAL | 0 refills | Status: DC

## 2018-10-08 MED ORDER — VICTOZA 18 MG/3ML ~~LOC~~ SOPN: PEN_INJECTOR | SUBCUTANEOUS | 0 refills | Status: DC

## 2018-10-08 MED ORDER — TOPIRAMATE 25 MG PO TABS: 25.0000 mg | ORAL_TABLET | Freq: Every day | ORAL | 0 refills | Status: DC

## 2018-10-08 MED ORDER — BUPROPION HCL ER (SR) 200 MG PO TB12: ORAL_TABLET | ORAL | 0 refills | Status: DC

## 2018-10-13 NOTE — Progress Notes (Signed)
Office: [phone]  /  Fax: [phone]   HPI:   Chief Complaint: OBESITY Darlene [name] is here to discuss her progress with her obesity treatment plan. She is on the  keep a food journal with 1300-1500 calories and 70g of protein  and is following her eating plan approximately 70 % of the time. She states she is exercising 0 minutes 0 times per week. Darlene [name] has done well with weight loss in the last few weeks. She notes hunger has improved but she is socializing with new neighbors and drinking more wine and hard cider in the evenings.  Her weight is 190 lb (86.2 kg) today and has had a weight loss of 5 pounds over a period of 6 months since her last in office visit. She has lost 35 lbs since starting treatment with [name].  Diabetes II Darlene [name] has a diagnosis of diabetes type II. Darlene [name] states BGs tends to go off track on days she drinks ETOH and denies any hypoglycemic episodes. Last A1c was 5.4. She has been working on intensive lifestyle modifications including diet, exercise, and weight loss to help control her blood glucose levels.  Hypertension Darlene [name] is a 68 y.o. female with hypertension.  Darlene [name] denies chest pain or shortness of breath on exertion. She is working weight loss to help control her blood pressure with the goal of decreasing her risk of heart attack and stroke. Darlene [name] blood pressure is currently controlled on current medications.    Depression with emotional eating behaviors Darlene [name] is struggling with emotional eating and using food for comfort to the extent that it is negatively impacting her health. She often snacks when she is not hungry. Darlene [name] sometimes feels she is out of control and then feels guilty that she made poor food choices. She has been working on behavior modification techniques to help reduce her emotional eating and has been somewhat successful. Her mood has improved now that her metastatic breast cancer has improved and the  liver mets is gone.  She shows no sign of insomnia, palpitation, suicidal or homicidal ideations. Her blood pressure is well controlled.   No flowsheet data found.    ASSESSMENT AND PLAN:  Type 2 diabetes mellitus with diabetic neuropathy, with long-term current use of insulin ([place]) - Plan: metFORMIN (GLUCOPHAGE) 500 MG tablet, liraglutide (VICTOZA) 18 MG/3ML SOPN  Essential hypertension - Plan: losartan (COZAAR) 100 MG tablet  Other depression - with emotional eating - Plan: vortioxetine HBr (TRINTELLIX) 20 MG TABS tablet, topiramate (TOPAMAX) 25 MG tablet, buPROPion (WELLBUTRIN SR) 200 MG 12 hr tablet  Class 1 obesity with serious comorbidity and body mass index (BMI) of 30.0 to 30.9 in adult, unspecified obesity type - Starting BMI greater then 30  PLAN: Diabetes II Darlene [name] has been given extensive diabetes education by myself today including ideal fasting and post-prandial blood glucose readings, individual ideal HgA1c goals  and hypoglycemia prevention. We discussed the importance of good blood sugar control to decrease the likelihood of diabetic complications such as nephropathy, neuropathy, limb loss, blindness, coronary artery disease, and death. We discussed the importance of intensive lifestyle modification including diet, exercise and weight loss as the first line treatment for diabetes. Darlene [name] agrees to continue her Lantus,  Metformin 500 mg daily #30 no refills and Victoza 1.8 mg daily #4 pens no refills sent tdoay and will follow up at the agreed upon time.  Hypertension We discussed sodium restriction, working on healthy weight loss, and a regular exercise program as the  means to achieve improved blood pressure control. Darlene [name] agreed with this plan and agreed to follow up as directed. We will continue to monitor her blood pressure as well as her progress with the above lifestyle modifications. She will continue her Losartan 100 mg daily #30 with no refills and will watch for  signs of hypotension as she continues her lifestyle modifications.  Depression with Emotional Eating Behaviors We discussed behavior modification techniques today to help Darlene [name] deal with her emotional eating and depression. She has agreed to continue to take Wellbutrin SR 150 mg qd #30 with no refills, Trintellix 20 mg qd #30 with no refills, Topamax 25 mg qd #30 with no refills and agreed to follow up as directed.  Obesity Darlene [name] is currently in the action stage of change. As such, her goal is to continue with weight loss efforts She has agreed to keep a food journal with 1300-1500 calories and 80+g of protein daily.  Darlene [name] has been instructed to work up to a goal of 150 minutes of combined cardio and strengthening exercise per week for weight loss and overall health benefits. We discussed the following Behavioral Modification Strategies today: increasing water intake, increasing lean protein intake and decrease liquid calories   Darlene [name] has agreed to follow up with our clinic in 3-4 weeks. She was informed of the importance of frequent follow up visits to maximize her success with intensive lifestyle modifications for her multiple health conditions.  ALLERGIES: Allergies  Allergen Reactions   Nsaids Other (See Comments)    H/o gastric bypass - avoid NSAIDs   Tape Hives, Rash and Other (See Comments)    Adhesives in bandaids    MEDICATIONS: Current Outpatient Medications on File Prior to Visit  Medication Sig Dispense Refill   acetaminophen (TYLENOL) 325 MG tablet Take 650 mg by mouth 2 (two) times daily as needed for moderate pain or headache.      Blood Glucose Monitoring Suppl (ONETOUCH VERIO FLEX SYSTEM) w/Device KIT 1 kit by Does not apply route 4 (four) times daily. Test blood sugars four times daily 1 kit 0   CALCIUM CITRATE-VITAMIN D PO Take 1 tablet by mouth 3 (three) times daily.      cholecalciferol (VITAMIN D) 1000 units tablet Take 1,000 Units by mouth  daily.     cholestyramine (QUESTRAN) 4 g packet Take 1 packet (4 g total) by mouth 2 (two) times daily as needed. 10 each 12   Cyanocobalamin (VITAMIN B-12 PO) Take 1 tablet by mouth every 14 (fourteen) days.     diphenhydrAMINE (BENADRYL) 25 MG tablet Take 25 mg by mouth daily as needed for allergies or sleep.      DUREZOL 0.05 % EMUL      glucose blood (ONETOUCH VERIO) test strip Use as instructed 100 each 0   Insulin Glargine (LANTUS SOLOSTAR) 100 UNIT/ML Solostar Pen Inject 12 Units into the skin daily. 5 pen 0   Insulin Pen Needle (BD PEN NEEDLE NANO U/F) 32G X 4 MM MISC 1 Package by Does not apply route daily at 12 noon. 100 each 0   loratadine (CLARITIN) 10 MG tablet Take 10 mg by mouth as needed for allergies.     ofloxacin (OCUFLOX) 0.3 % ophthalmic solution      OneTouch Delica Lancets 33L MISC 1 Package by Does not apply route daily. 100 each 0   Pediatric Multivitamins-Iron (FLINTSTONES PLUS IRON) chewable tablet Chew 1 tablet by mouth 2 (two) times daily.     Probiotic Product (  [employer] PROBIOTIC PO) Take 1 capsule by mouth daily.      prochlorperazine (COMPAZINE) 10 MG tablet Take 1 tablet (10 mg total) by mouth every 6 (six) hours as needed for nausea or vomiting. 30 tablet 0   Propylene Glycol (SYSTANE BALANCE OP) Place 1-2 drops into both eyes 3 (three) times daily as needed (for dry eyes).      senna-docusate (SENNA PLUS) 8.6-50 MG tablet Take 1 tablet by mouth daily as needed for moderate constipation.      tobramycin (TOBREX) 0.3 % ophthalmic solution      No current facility-administered medications on file prior to visit.     PAST MEDICAL HISTORY: Past Medical History:  Diagnosis Date   Anemia    hx of - during 1st round chemo   Anxiety    Arthritis    in fingers, shoulders   Back pain    Balance problem    Breast cancer ([place])    Breast cancer of upper-outer quadrant of right female breast ([place]) 07/22/2014   Bunion, right foot    Clumsiness      Constipation    Depression    Diabetes mellitus without complication ([place])    69+ years   Diabetic retinopathy ([place])    torn retina   Eating disorder    binge eating   Epistaxis 08/26/2014   Fatty liver    HCAP (healthcare-associated pneumonia) 12/18/2014   Headache    Heart murmur    never had any problems   History of radiation therapy 04/05/15-05/19/15   right chest wall was treated to 50.4 Gy in 28 fractions, right mastectomy scar was treated to 10 Gy in 5 fractions   Hyperlipidemia    Hypertension    Joint pain    Multiple food allergies    Neuromuscular disorder ([place])    diabetic neuropathy   Obesity    Obstructive sleep apnea on CPAP    uses cpap nightly   Ovarian cyst rupture    possible   Pneumonia    Pneumonia 11/2014   Radiation 04/05/15-05/19/15   right chest wall 50.4 Gy, mastectomy scar 10 Gy   Sleep apnea    Thyroid nodule 06/2014    PAST SURGICAL HISTORY: Past Surgical History:  Procedure Laterality Date   APPENDECTOMY     BIOPSY THYROID Bilateral 06/2014   2 nodules - Benign    BREAST LUMPECTOMY WITH RADIOACTIVE SEED AND SENTINEL LYMPH NODE BIOPSY Right 12/27/2014   Procedure: BREAST LUMPECTOMY WITH RADIOACTIVE SEED AND SENTINEL LYMPH NODE BIOPSY;  Surgeon: Stark Klein, MD;  Location: [facility];  Service: General;  Laterality: Right;   BREAST REDUCTION SURGERY Bilateral 01/06/2015   Procedure: BILATERAL BREAST REDUCTION, RIGHT ONCOPLASTIC RECONSTRUCTION),LEFT BREAST REDUCTION FOR SYMMETRY;  Surgeon: [name], MD;  Location: [facility];  Service: Plastics;  Laterality: Bilateral;   BREATH TEK H PYLORI N/A 09/15/2012   Procedure: BREATH TEK H PYLORI;  Surgeon: Pedro Earls, MD;  Location: [facility];  Service: General;  Laterality: N/A;   BUNIONECTOMY Left 12/2013   CARPAL TUNNEL RELEASE Right    CARPAL TUNNEL RELEASE Left    CATARACT EXTRACTION     6/19   COLONOSCOPY     DUPUYTREN CONTRACTURE  RELEASE     GASTRIC ROUX-EN-Y N/A 11/02/2012   Procedure: LAPAROSCOPIC ROUX-EN-Y GASTRIC;  Surgeon: Pedro Earls, MD;  Location: [facility];  Service: General;  Laterality: N/A;   IR GENERIC HISTORICAL  03/18/2016   IR US GUIDE VASC  ACCESS LEFT 03/18/2016 Markus Daft, MD [facility]   IR GENERIC HISTORICAL  03/18/2016   IR FLUORO GUIDE CV LINE LEFT 03/18/2016 Markus Daft, MD [facility]   LAPAROSCOPIC APPENDECTOMY N/A 07/22/2015   Procedure: APPENDECTOMY LAPAROSCOPIC;  Surgeon: Michael Boston, MD;  Location: [facility];  Service: General;  Laterality: N/A;   MASTECTOMY Right 02/15/2015   modified   MODIFIED MASTECTOMY Right 02/15/2015   Procedure: RIGHT MODIFIED RADICAL MASTECTOMY;  Surgeon: Stark Klein, MD;  Location: [facility];  Service: General;  Laterality: Right;   PORT-A-CATH REMOVAL Left 10/10/2015   Procedure: PORT REMOVAL;  Surgeon: Stark Klein, MD;  Location: [facility];  Service: General;  Laterality: Left;   PORTACATH PLACEMENT Left 08/02/2014   Procedure: INSERTION PORT-A-CATH;  Surgeon: Stark Klein, MD;  Location: [facility];  Service: General;  Laterality: Left;   PORTACATH PLACEMENT N/A 11/05/2016   Procedure: INSERTION PORT-A-CATH;  Surgeon: Stark Klein, MD;  Location: [facility];  Service: General;  Laterality: N/A;   RETINAL LASER PROCEDURE     retina tear on left eye   ROTATOR CUFF REPAIR Right 2005   SCAR REVISION Right 12/08/2015   Procedure: COMPLEX REPAIR RIGHT CHEST 10-15CM;  Surgeon: [name], MD;  Location: [facility];  Service: Plastics;  Laterality: Right;   TOE SURGERY  1970s   bone spur   TRIGGER FINGER RELEASE     x3    SOCIAL HISTORY: Social History   Tobacco Use   Smoking status: Never Smoker   Smokeless tobacco: Never Used  Substance Use Topics   Alcohol use: Yes    Alcohol/week: 1.0 - 2.0 standard drinks    Types: 1 - 2 Standard drinks or equivalent per week    Comment: social   Drug use: No    FAMILY  HISTORY: Family History  Problem Relation Age of Onset   CVA Mother    Heart disease Mother    Sudden death Mother    Diabetes Father    Heart failure Father    Hypertension Father    Kidney disease Father    Obesity Father    Hypertension Sister    Diabetes Brother    Breast cancer Paternal Grandmother 9   Diabetes Paternal Uncle    Ovarian cancer Neg Hx     ROS: Review of Systems  Constitutional: Positive for weight loss.  Respiratory: Negative for shortness of breath.   Cardiovascular: Negative for chest pain.  Psychiatric/Behavioral: Positive for depression. Negative for suicidal ideas.    PHYSICAL EXAM: Blood pressure 114/71, pulse 65, temperature 98.2 F (36.8 C), temperature source Oral, height '5\' 9"'  (1.753 m), weight 190 lb (86.2 kg), last menstrual period 10/22/2007, SpO2 98 %. Body mass index is 28.06 kg/m. Physical Exam Vitals signs reviewed.  Constitutional:      Appearance: Normal appearance. She is obese.  HENT:     Head: Normocephalic.     Nose: Nose normal.  Neck:     Musculoskeletal: Normal range of motion.  Cardiovascular:     Rate and Rhythm: Normal rate.  Pulmonary:     Effort: Pulmonary effort is normal.  Musculoskeletal: Normal range of motion.  Skin:    General: Skin is warm and dry.  Neurological:     Mental Status: She is alert and oriented to person, place, and time.  Psychiatric:        Mood and Affect: Mood normal.        Behavior: Behavior normal.  RECENT LABS AND TESTS: BMET    Component Value Date/Time   NA 141 10/06/2018 0815   NA 143 03/11/2017 0841   NA 140 01/07/2017 0758   K 3.9 10/06/2018 0815   K 4.1 01/07/2017 0758   CL 113 (H) 10/06/2018 0815   CO2 22 10/06/2018 0815   CO2 24 01/07/2017 0758   GLUCOSE 188 (H) 10/06/2018 0815   GLUCOSE 134 01/07/2017 0758   BUN 28 (H) 10/06/2018 0815   BUN 40 (H) 03/11/2017 0841   BUN 33.6 (H) 01/07/2017 0758   CREATININE 1.67 (H) 10/06/2018 0815    CREATININE 1.62 (H) 04/28/2018 0915   CREATININE 1.5 (H) 01/07/2017 0758   CALCIUM 8.5 (L) 10/06/2018 0815   CALCIUM 9.3 01/07/2017 0758   GFRNONAA 32 (L) 10/06/2018 0815   GFRNONAA 33 (L) 04/28/2018 0915   GFRNONAA 55 (L) 02/18/2013 0827   GFRAA 37 (L) 10/06/2018 0815   GFRAA 38 (L) 04/28/2018 0915   GFRAA 63 02/18/2013 0827   Lab Results  Component Value Date   HGBA1C 5.4 08/27/2018   HGBA1C 5.8 (H) 09/03/2017   HGBA1C 6.1 (H) 03/11/2017   HGBA1C 8.2 (H) 11/05/2016   HGBA1C 8.1 (H) 08/09/2016   Lab Results  Component Value Date   INSULIN 15.3 03/11/2017   INSULIN 18.3 08/09/2016   CBC    Component Value Date/Time   WBC 3.6 (L) 10/06/2018 0815   RBC 3.10 (L) 10/06/2018 0815   HGB 9.9 (L) 10/06/2018 0815   HGB 10.6 (L) 12/16/2017 0806   HGB 10.8 (L) 01/07/2017 0758   HCT 30.7 (L) 10/06/2018 0815   HCT 31.9 (L) 01/07/2017 0758   PLT 113 (L) 10/06/2018 0815   PLT 83 (L) 12/16/2017 0806   PLT 126 (L) 01/07/2017 0758   MCV 99.0 10/06/2018 0815   MCV 90.8 01/07/2017 0758   MCH 31.9 10/06/2018 0815   MCHC 32.2 10/06/2018 0815   RDW 13.9 10/06/2018 0815   RDW 14.8 (H) 01/07/2017 0758   LYMPHSABS 0.7 10/06/2018 0815   LYMPHSABS 1.1 01/07/2017 0758   MONOABS 0.3 10/06/2018 0815   MONOABS 0.3 01/07/2017 0758   EOSABS 0.1 10/06/2018 0815   EOSABS 0.1 01/07/2017 0758   EOSABS 0.1 08/09/2016 1229   BASOSABS 0.0 10/06/2018 0815   BASOSABS 0.0 01/07/2017 0758   Iron/TIBC/Ferritin/ %Sat    Component Value Date/Time   IRON 58 02/18/2013 0827   TIBC 305 02/18/2013 0827   FERRITIN 133 10/31/2014 0816   IRONPCTSAT 19 (L) 02/18/2013 0827   Lipid Panel     Component Value Date/Time   CHOL 137 08/27/2018 0814   TRIG 56 08/27/2018 0814   HDL 73 08/27/2018 0814   CHOLHDL 2.9 02/18/2013 0827   VLDL 23 02/18/2013 0827   LDLCALC 53 08/27/2018 0814   Hepatic Function Panel     Component Value Date/Time   PROT 6.1 (L) 10/06/2018 0815   PROT 6.9 03/11/2017 0841   PROT 6.7  01/07/2017 0758   ALBUMIN 3.7 10/06/2018 0815   ALBUMIN 4.5 03/11/2017 0841   ALBUMIN 3.6 01/07/2017 0758   AST 38 10/06/2018 0815   AST 43 (H) 04/28/2018 0915   AST 51 (H) 01/07/2017 0758   ALT 60 (H) 10/06/2018 0815   ALT 61 (H) 04/28/2018 0915   ALT 82 (H) 01/07/2017 0758   ALKPHOS 99 10/06/2018 0815   ALKPHOS 115 01/07/2017 0758   BILITOT 0.5 10/06/2018 0815   BILITOT 0.6 04/28/2018 0915   BILITOT 0.72 01/07/2017 0758  Component Value Date/Time   TSH 1.54 02/07/2017   TSH 2.170 08/09/2016 1229      OBESITY BEHAVIORAL INTERVENTION VISIT  Today's visit was # 19  Starting weight: 225 lbs Starting date: 08/09/16 Today's weight : Weight: 190 lb (86.2 kg)  Today's date: 10/08/18 Total lbs lost to date: 35 lbs At least 15 minutes were spent on discussing the following behavioral intervention visit.   ASK: We discussed the diagnosis of obesity with Darlene [name] today and Thuy agreed to give [name] permission to discuss obesity behavioral modification therapy today.  ASSESS: Gracieann has the diagnosis of obesity and her BMI today is 28.05 Tatyana is in the action stage of change   ADVISE: Keaton was educated on the multiple health risks of obesity as well as the benefit of weight loss to improve her health. She was advised of the need for long term treatment and the importance of lifestyle modifications to improve her current health and to decrease her risk of future health problems.  AGREE: Multiple dietary modification options and treatment options were discussed and  Cayla agreed to follow the recommendations documented in the above note.  ARRANGE: Preslie was educated on the importance of frequent visits to treat obesity as outlined per CMS and USPSTF guidelines and agreed to schedule her next follow up appointment today.  Leary Roca, am acting as Location manager for Dennard Nip, MD

## 2018-10-14 ENCOUNTER — Encounter: Payer: Self-pay | Admitting: Podiatry

## 2018-10-14 ENCOUNTER — Ambulatory Visit (INDEPENDENT_AMBULATORY_CARE_PROVIDER_SITE_OTHER): Payer: Medicare Other | Admitting: Podiatry

## 2018-10-14 ENCOUNTER — Other Ambulatory Visit: Payer: Self-pay

## 2018-10-14 DIAGNOSIS — B351 Tinea unguium: Secondary | ICD-10-CM

## 2018-10-14 DIAGNOSIS — M79674 Pain in right toe(s): Secondary | ICD-10-CM | POA: Diagnosis not present

## 2018-10-14 DIAGNOSIS — L84 Corns and callosities: Secondary | ICD-10-CM

## 2018-10-14 DIAGNOSIS — M79675 Pain in left toe(s): Secondary | ICD-10-CM | POA: Diagnosis not present

## 2018-10-14 NOTE — Progress Notes (Signed)
Complaint:  Visit Type: Patient returns to my office for continued preventative foot care services. Complaint: Patient states" my nails have grown long and thick and become painful to walk and wear shoes" Patient has been diagnosed with DM with no foot complications. The patient presents for preventative foot care services. No changes to ROS  Podiatric Exam: Vascular: dorsalis pedis and posterior tibial pulses are palpable bilateral. Capillary return is immediate. Temperature gradient is WNL. Skin turgor WNL  Sensorium: Normal Semmes Weinstein monofilament test. Normal tactile sensation bilaterally. Nail Exam: Pt has thick disfigured discolored nails with subungual debris noted hallux nails  B/L. Ulcer Exam: There is no evidence of ulcer or pre-ulcerative changes or infection. Orthopedic Exam: Muscle tone and strength are WNL. No limitations in general ROM. No crepitus or effusions noted. Foot type and digits show no abnormalities. Severe HAV right foot. Skin: No Porokeratosis. No infection or ulcers.  Hemorrhagic pinch callus right foot.  Diagnosis:  Onychomycosis, , Pain in right toe, pain in left toes  Treatment & Plan Procedures and Treatment: Consent by patient was obtained for treatment procedures.   Debridement of mycotic and hypertrophic toenails, 1 through 5 bilateral and clearing of subungual debris. No ulceration, no infection noted. Patient says she desires a referral for bunion surgery right foot.  She says her labs are better and this is a good time to schedule surgery. Return Visit-Office Procedure: Patient instructed to return to the office for a follow up visit 3 months for continued evaluation and treatment.    Gardiner Barefoot DPM

## 2018-10-21 ENCOUNTER — Encounter: Payer: Self-pay | Admitting: Neurology

## 2018-10-26 ENCOUNTER — Ambulatory Visit: Payer: Medicare Other | Admitting: Podiatry

## 2018-10-26 ENCOUNTER — Encounter: Payer: Self-pay | Admitting: Adult Health

## 2018-10-26 ENCOUNTER — Encounter: Payer: Self-pay | Admitting: Podiatry

## 2018-10-26 ENCOUNTER — Other Ambulatory Visit: Payer: Self-pay

## 2018-10-26 DIAGNOSIS — M2041 Other hammer toe(s) (acquired), right foot: Secondary | ICD-10-CM

## 2018-10-26 DIAGNOSIS — M2011 Hallux valgus (acquired), right foot: Secondary | ICD-10-CM | POA: Diagnosis not present

## 2018-10-26 NOTE — Patient Instructions (Signed)
Pre-Operative Instructions  Congratulations, you have decided to take an important step towards improving your quality of life.  You can be assured that the doctors and staff at [facility] will be with you every step of the way.  Here are some important things you should know:  1. Plan to be at the surgery center/hospital at least 1 (one) hour prior to your scheduled time, unless otherwise directed by the surgical center/hospital staff.  You must have a responsible adult accompany you, remain during the surgery and drive you home.  Make sure you have directions to the surgical center/hospital to ensure you arrive on time. 2. If you are having surgery at [facility] or [facility] hospitals, you will need a copy of your medical history and physical form from your family physician within one month prior to the date of surgery. We will give you a form for your primary physician to complete.  3. We make every effort to accommodate the date you request for surgery.  However, there are times where surgery dates or times have to be moved.  We will contact you as soon as possible if a change in schedule is required.   4. No aspirin/ibuprofen for one week before surgery.  If you are on aspirin, any non-steroidal anti-inflammatory medications (Mobic, Aleve, Ibuprofen) should not be taken seven (7) days prior to your surgery.  You make take Tylenol for pain prior to surgery.  5. Medications - If you are taking daily heart and blood pressure medications, seizure, reflux, allergy, asthma, anxiety, pain or diabetes medications, make sure you notify the surgery center/hospital before the day of surgery so they can tell you which medications you should take or avoid the day of surgery. 6. No food or drink after midnight the night before surgery unless directed otherwise by surgical center/hospital staff. 7. No alcoholic beverages 24-hours prior to surgery.  No smoking 24-hours prior or 24-hours after  surgery. 8. Wear loose pants or shorts. They should be loose enough to fit over bandages, boots, and casts. 9. Don't wear slip-on shoes. Sneakers are preferred. 10. Bring your boot with you to the surgery center/hospital.  Also bring crutches or a walker if your physician has prescribed it for you.  If you do not have this equipment, it will be provided for you after surgery. 11. If you have not been contacted by the surgery center/hospital by the day before your surgery, call to confirm the date and time of your surgery. 12. Leave-time from work may vary depending on the type of surgery you have.  Appropriate arrangements should be made prior to surgery with your employer. 13. Prescriptions will be provided immediately following surgery by your doctor.  Fill these as soon as possible after surgery and take the medication as directed. Pain medications will not be refilled on weekends and must be approved by the doctor. 14. Remove nail polish on the operative foot and avoid getting pedicures prior to surgery. 15. Wash the night before surgery.  The night before surgery wash the foot and leg well with water and the antibacterial soap provided. Be sure to pay special attention to beneath the toenails and in between the toes.  Wash for at least three (3) minutes. Rinse thoroughly with water and dry well with a towel.  Perform this wash unless told not to do so by your physician.  Enclosed: 1 Ice pack (please put in freezer the night before surgery)   1 Hibiclens skin cleaner     Pre-op instructions  If you have any questions regarding the instructions, please do not hesitate to call our office.  [place]: [address], [place], [place] [address] -- [phone]  [place]: [address] [place], [place] [address] -- [phone]  [place]: [address]  [place],  [address] -- [phone]   Website: [email or web address] 

## 2018-10-27 ENCOUNTER — Inpatient Hospital Stay: Payer: Medicare Other | Admitting: Adult Health

## 2018-10-27 ENCOUNTER — Telehealth: Payer: Self-pay | Admitting: Podiatry

## 2018-10-27 ENCOUNTER — Inpatient Hospital Stay: Payer: Medicare Other

## 2018-10-27 ENCOUNTER — Other Ambulatory Visit: Payer: Self-pay | Admitting: Oncology

## 2018-10-27 ENCOUNTER — Other Ambulatory Visit: Payer: Self-pay

## 2018-10-27 ENCOUNTER — Encounter: Payer: Self-pay | Admitting: Adult Health

## 2018-10-27 ENCOUNTER — Inpatient Hospital Stay: Payer: Medicare Other | Attending: Oncology

## 2018-10-27 VITALS — BP 133/54 | HR 83 | Temp 98.3°F | Resp 18 | Ht 69.0 in | Wt 187.6 lb

## 2018-10-27 DIAGNOSIS — C50411 Malignant neoplasm of upper-outer quadrant of right female breast: Secondary | ICD-10-CM

## 2018-10-27 DIAGNOSIS — Z5112 Encounter for antineoplastic immunotherapy: Secondary | ICD-10-CM | POA: Diagnosis present

## 2018-10-27 DIAGNOSIS — N83209 Unspecified ovarian cyst, unspecified side: Secondary | ICD-10-CM | POA: Diagnosis not present

## 2018-10-27 DIAGNOSIS — E041 Nontoxic single thyroid nodule: Secondary | ICD-10-CM | POA: Insufficient documentation

## 2018-10-27 DIAGNOSIS — Z171 Estrogen receptor negative status [ER-]: Secondary | ICD-10-CM

## 2018-10-27 DIAGNOSIS — C801 Malignant (primary) neoplasm, unspecified: Secondary | ICD-10-CM

## 2018-10-27 DIAGNOSIS — C773 Secondary and unspecified malignant neoplasm of axilla and upper limb lymph nodes: Secondary | ICD-10-CM | POA: Insufficient documentation

## 2018-10-27 DIAGNOSIS — C787 Secondary malignant neoplasm of liver and intrahepatic bile duct: Secondary | ICD-10-CM | POA: Insufficient documentation

## 2018-10-27 DIAGNOSIS — Z95828 Presence of other vascular implants and grafts: Secondary | ICD-10-CM

## 2018-10-27 DIAGNOSIS — C50911 Malignant neoplasm of unspecified site of right female breast: Secondary | ICD-10-CM

## 2018-10-27 LAB — COMPREHENSIVE METABOLIC PANEL
ALT: 81 U/L — ABNORMAL HIGH (ref 0–44)
AST: 53 U/L — ABNORMAL HIGH (ref 15–41)
Albumin: 4 g/dL (ref 3.5–5.0)
Alkaline Phosphatase: 111 U/L (ref 38–126)
Anion gap: 9 (ref 5–15)
BUN: 33 mg/dL — ABNORMAL HIGH (ref 8–23)
CO2: 20 mmol/L — ABNORMAL LOW (ref 22–32)
Calcium: 9.2 mg/dL (ref 8.9–10.3)
Chloride: 111 mmol/L (ref 98–111)
Creatinine, Ser: 1.81 mg/dL — ABNORMAL HIGH (ref 0.44–1.00)
GFR calc Af Amer: 33 mL/min — ABNORMAL LOW (ref >60)
GFR calc non Af Amer: 29 mL/min — ABNORMAL LOW (ref >60)
Glucose, Bld: 132 mg/dL — ABNORMAL HIGH (ref 70–99)
Potassium: 4.3 mmol/L (ref 3.5–5.1)
Sodium: 140 mmol/L (ref 135–145)
Total Bilirubin: 0.7 mg/dL (ref 0.3–1.2)
Total Protein: 6.8 g/dL (ref 6.5–8.1)

## 2018-10-27 LAB — CBC WITH DIFFERENTIAL/PLATELET
Abs Immature Granulocytes: 0.03 10*3/uL (ref 0.00–0.07)
Basophils Absolute: 0 10*3/uL (ref 0.0–0.1)
Basophils Relative: 0 %
Eosinophils Absolute: 0.1 10*3/uL (ref 0.0–0.5)
Eosinophils Relative: 2 %
HCT: 34.3 % — ABNORMAL LOW (ref 36.0–46.0)
Hemoglobin: 11.6 g/dL — ABNORMAL LOW (ref 12.0–15.0)
Immature Granulocytes: 1 %
Lymphocytes Relative: 24 %
Lymphs Abs: 1.2 10*3/uL (ref 0.7–4.0)
MCH: 32 pg (ref 26.0–34.0)
MCHC: 33.8 g/dL (ref 30.0–36.0)
MCV: 94.5 fL (ref 80.0–100.0)
Monocytes Absolute: 0.5 10*3/uL (ref 0.1–1.0)
Monocytes Relative: 9 %
Neutro Abs: 3.4 10*3/uL (ref 1.7–7.7)
Neutrophils Relative %: 64 %
Platelets: 146 10*3/uL — ABNORMAL LOW (ref 150–400)
RBC: 3.63 MIL/uL — ABNORMAL LOW (ref 3.87–5.11)
RDW: 13.1 % (ref 11.5–15.5)
WBC: 5.2 10*3/uL (ref 4.0–10.5)
nRBC: 0 % (ref 0.0–0.2)

## 2018-10-27 LAB — CEA (IN HOUSE-CHCC): CEA (CHCC-In House): 2.52 ng/mL (ref 0.00–5.00)

## 2018-10-27 MED ORDER — DIPHENHYDRAMINE HCL 25 MG PO CAPS
25.0000 mg | ORAL_CAPSULE | Freq: Once | ORAL | Status: AC
  Administered 2018-10-27: 25 mg via ORAL

## 2018-10-27 MED ORDER — SODIUM CHLORIDE 0.9 % IV SOLN
420.0000 mg | Freq: Once | INTRAVENOUS | Status: AC
  Administered 2018-10-27: 420 mg via INTRAVENOUS
  Filled 2018-10-27: qty 14

## 2018-10-27 MED ORDER — TRASTUZUMAB-ANNS CHEMO 420 MG IV SOLR
6.0000 mg/kg | Freq: Once | INTRAVENOUS | Status: AC
  Administered 2018-10-27: 546 mg via INTRAVENOUS
  Filled 2018-10-27: qty 26

## 2018-10-27 MED ORDER — SODIUM CHLORIDE 0.9 % IV SOLN
Freq: Once | INTRAVENOUS | Status: AC
  Administered 2018-10-27: 15:00:00 via INTRAVENOUS
  Filled 2018-10-27: qty 250

## 2018-10-27 MED ORDER — SODIUM CHLORIDE 0.9% FLUSH
10.0000 mL | INTRAVENOUS | Status: DC | PRN
  Administered 2018-10-27: 10 mL via INTRAVENOUS
  Filled 2018-10-27: qty 10

## 2018-10-27 MED ORDER — ACETAMINOPHEN 325 MG PO TABS
ORAL_TABLET | ORAL | Status: AC
  Filled 2018-10-27: qty 2

## 2018-10-27 MED ORDER — SODIUM CHLORIDE 0.9% FLUSH
10.0000 mL | INTRAVENOUS | Status: DC | PRN
  Administered 2018-10-27: 10 mL
  Filled 2018-10-27: qty 10

## 2018-10-27 MED ORDER — DIPHENHYDRAMINE HCL 25 MG PO CAPS
ORAL_CAPSULE | ORAL | Status: AC
  Filled 2018-10-27: qty 1

## 2018-10-27 MED ORDER — HEPARIN SOD (PORK) LOCK FLUSH 100 UNIT/ML IV SOLN
500.0000 [IU] | Freq: Once | INTRAVENOUS | Status: AC | PRN
  Administered 2018-10-27: 500 [IU]
  Filled 2018-10-27: qty 5

## 2018-10-27 MED ORDER — ACETAMINOPHEN 325 MG PO TABS
650.0000 mg | ORAL_TABLET | Freq: Once | ORAL | Status: AC
  Administered 2018-10-27: 650 mg via ORAL

## 2018-10-27 NOTE — Patient Instructions (Signed)
[facility] Discharge Instructions for Patients Receiving Chemotherapy  Today you received the following chemotherapy agents: trastuzumab and pertuzumab.  To help prevent nausea and vomiting after your treatment, we encourage you to take your nausea medication as directed.   If you develop nausea and vomiting that is not controlled by your nausea medication, call the clinic.   BELOW ARE SYMPTOMS THAT SHOULD BE REPORTED IMMEDIATELY:  *FEVER GREATER THAN 100.5 F  *CHILLS WITH OR WITHOUT FEVER  NAUSEA AND VOMITING THAT IS NOT CONTROLLED WITH YOUR NAUSEA MEDICATION  *UNUSUAL SHORTNESS OF BREATH  *UNUSUAL BRUISING OR BLEEDING  TENDERNESS IN MOUTH AND THROAT WITH OR WITHOUT PRESENCE OF ULCERS  *URINARY PROBLEMS  *BOWEL PROBLEMS  UNUSUAL RASH Items with * indicate a potential emergency and should be followed up as soon as possible.  Feel free to call the clinic should you have any questions or concerns. The clinic phone number is [phone].  Please show the CHEMO ALERT CARD at check-in to the Emergency Department and triage nurse.   

## 2018-10-27 NOTE — Progress Notes (Signed)
Darlene [name]  [phone] [phone]    ID: TEQUISHA MAAHS DOB: February 10, 1950  MR#: [id number]  ZHY#:[id number]  Patient Care Team: Starlyn Skeans, MD as PCP - General (Family Medicine) Stark Klein, MD as Consulting Physician (General Surgery) Magrinat, Virgie Dad, MD as Consulting Physician (Oncology) Mauro Kaufmann, RN as Registered Nurse Rockwell Germany, RN as Registered Nurse Jacelyn Pi, MD as Consulting Physician (Endocrinology) [name], MD as Consulting Physician (Plastic Surgery) Larey Dresser, MD as Consulting Physician (Cardiology) Gery Pray, MD as Consulting Physician (Radiation Oncology) Juanita Craver, MD as Consulting Physician (Gastroenterology) Dohmeier, Asencion Partridge, MD as Consulting Physician (Neurology)  OTHER MD:   CHIEF COMPLAINT:  HER-2 positive, estrogen receptor negative locally recurrent disease  CURRENT TREATMENT:  Trastuzumab; to add Pertuzumab   INTERVAL HISTORY:  Darlene [name] returns today for follow-up and treatment of her estrogen receptor negative but HER-2 amplified breast cancer.    REVIEW OF SYSTEMS: Darlene [name] is doing well today.  She denies any pain.  She has a question about her tumor markers increasing.  She also wants to know about the new subcutaneous Herceptin/Perjeta injections.    Darlene [name] notes she is feeling well overall.  She has  No appetite changes, nausea, vomiting, bowel/bladder changes.  She denies any headaches, vision issues.  She has no cough, shortness of breath, chest pain, or palpitations.  A detailed ROS was otherwise non contributory.    BREAST CANCER HISTORY: From the original intake note:  "Darlene [name]" had screening mammography at her gynecologist suggestive of a possible abnormality in the right breast. However right diagnostic mammogram 04/12/2013 at [facility] was negative. More recently however, she noted a lump in her right breast and brought it to her gynecologist's attention. On  07/18/2014 the patient had bilateral diagnostic mammography with tomosynthesis at [facility]. The breast density was category B. In the right breast upper outer quadrant there was an area of focal asymmetry with indistinct margins.[name] Ultrasound was obtained and showed in addition to the mass in question, measuring 1.7 cm, an abnormal-appearing lymph node in the right axillary tail.  On 07/19/2014 the patient underwent biopsy of the right breast massin question as well as a right axillary lymph node. Both were positive for invasive ductal carcinoma, grade 2, estrogen and progesterone receptor negative, with an MIB-1 of 90%, and HER-2 pending. Incidentally the lymph node biopsy showed a lymphocytic inflammatory component but no lymph node architecture.  Her subsequent history is as detailed above.   PAST MEDICAL HISTORY: Past Medical History:  Diagnosis Date  . Anemia    hx of - during 1st round chemo  . Anxiety   . Arthritis    in fingers, shoulders  . Back pain   . Balance problem   . Breast cancer ([place])   . Breast cancer of upper-outer quadrant of right female breast ([place]) 07/22/2014  . Bunion, right foot   . Clumsiness   . Constipation   . Depression   . Diabetes mellitus without complication ([place])    29+ years  . Diabetic retinopathy ([place])    torn retina  . Eating disorder    binge eating  . Epistaxis 08/26/2014  . Fatty liver   . HCAP (healthcare-associated pneumonia) 12/18/2014  . Headache   . Heart murmur    never had any problems  . History of radiation therapy 04/05/15-05/19/15   right chest wall was treated to 50.4 Gy in 28 fractions, right mastectomy scar was treated to 10 Gy in 5  fractions  . Hyperlipidemia   . Hypertension   . Joint pain   . Multiple food allergies   . Neuromuscular disorder ([place])    diabetic neuropathy  . Obesity   . Obstructive sleep apnea on CPAP    uses cpap nightly  . Ovarian cyst rupture    possible  . Pneumonia   . Pneumonia 11/2014  .  Radiation 04/05/15-05/19/15   right chest wall 50.4 Gy, mastectomy scar 10 Gy  . Sleep apnea   . Thyroid nodule 06/2014    PAST SURGICAL HISTORY: Past Surgical History:  Procedure Laterality Date  . APPENDECTOMY    . BIOPSY THYROID Bilateral 06/2014   2 nodules - Benign   . BREAST LUMPECTOMY WITH RADIOACTIVE SEED AND SENTINEL LYMPH NODE BIOPSY Right 12/27/2014   Procedure: BREAST LUMPECTOMY WITH RADIOACTIVE SEED AND SENTINEL LYMPH NODE BIOPSY;  Surgeon: Stark Klein, MD;  Location: [place];  Service: General;  Laterality: Right;  . BREAST REDUCTION SURGERY Bilateral 01/06/2015   Procedure: BILATERAL BREAST REDUCTION, RIGHT ONCOPLASTIC RECONSTRUCTION),LEFT BREAST REDUCTION FOR SYMMETRY;  Surgeon: [name], MD;  Location: [place];  Service: Plastics;  Laterality: Bilateral;  . BREATH TEK H PYLORI N/A 09/15/2012   Procedure: BREATH TEK H PYLORI;  Surgeon: Pedro Earls, MD;  Location: [facility];  Service: General;  Laterality: N/A;  . BUNIONECTOMY Left 12/2013  . CARPAL TUNNEL RELEASE Right   . CARPAL TUNNEL RELEASE Left   . CATARACT EXTRACTION     6/19  . COLONOSCOPY    . DUPUYTREN CONTRACTURE RELEASE    . GASTRIC ROUX-EN-Y N/A 11/02/2012   Procedure: LAPAROSCOPIC ROUX-EN-Y GASTRIC;  Surgeon: Pedro Earls, MD;  Location: [facility];  Service: General;  Laterality: N/A;  . IR GENERIC HISTORICAL  03/18/2016   IR US GUIDE VASC ACCESS LEFT 03/18/2016 Markus Daft, MD [facility]  . IR GENERIC HISTORICAL  03/18/2016   IR FLUORO GUIDE CV LINE LEFT 03/18/2016 Markus Daft, MD [facility]  . LAPAROSCOPIC APPENDECTOMY N/A 07/22/2015   Procedure: APPENDECTOMY LAPAROSCOPIC;  Surgeon: Michael Boston, MD;  Location: [facility];  Service: General;  Laterality: N/A;  . MASTECTOMY Right 02/15/2015   modified  . MODIFIED MASTECTOMY Right 02/15/2015   Procedure: RIGHT MODIFIED RADICAL MASTECTOMY;  Surgeon: Stark Klein, MD;  Location: [place];  Service: General;  Laterality: Right;  .  PORT-A-CATH REMOVAL Left 10/10/2015   Procedure: PORT REMOVAL;  Surgeon: Stark Klein, MD;  Location: [place];  Service: General;  Laterality: Left;  . PORTACATH PLACEMENT Left 08/02/2014   Procedure: INSERTION PORT-A-CATH;  Surgeon: Stark Klein, MD;  Location: [place];  Service: General;  Laterality: Left;  . PORTACATH PLACEMENT N/A 11/05/2016   Procedure: INSERTION PORT-A-CATH;  Surgeon: Stark Klein, MD;  Location: [place];  Service: General;  Laterality: N/A;  . RETINAL LASER PROCEDURE     retina tear on left eye  . ROTATOR CUFF REPAIR Right 2005  . SCAR REVISION Right 12/08/2015   Procedure: COMPLEX REPAIR RIGHT CHEST 10-15CM;  Surgeon: [name], MD;  Location: [place];  Service: Plastics;  Laterality: Right;  . TOE SURGERY  1970s   bone spur  . TRIGGER FINGER RELEASE     x3    FAMILY HISTORY Family History  Problem Relation Age of Onset  . CVA Mother   . Heart disease Mother   . Sudden death Mother   . Diabetes Father   . Heart failure Father   . Hypertension  Father   . Kidney disease Father   . Obesity Father   . Hypertension Sister   . Diabetes Brother   . Breast cancer Paternal Grandmother 73  . Diabetes Paternal Uncle   . Ovarian cancer Neg Hx    The patient's father died from complications of diabetes at age 31. The patient's mother died at age 53 with a subarachnoid hemorrhage. The patient had one brother, one sister. The only breast cancer was the patient's paternal grandmother diagnosed in her 36s. There is no history of ovarian cancer in the family.   GYNECOLOGIC HISTORY:  Patient's last menstrual period was 10/22/2007.  menarche age 79, the patient is GX P0. She went through the change of life in 2011. She did not take hormone replacement.   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 She is also a Public relations account executive. Her husband [name] is a retired Visual merchandiser (he taught at [employer]).  In May 2020 they moved [place].  [name] has 2 children from an earlier marriage, Rockey Situ who teaches in [place], and Jashayla Glatfelter,  who works at the [employer] in in [place]. He has 1 grand son, aged 85. The patient and her husband are not church attenders.   ADVANCED DIRECTIVES:  Not in place   HEALTH MAINTENANCE: Social History   Tobacco Use  . Smoking status: Never Smoker  . Smokeless tobacco: Never Used  Substance Use Topics  . Alcohol use: Yes    Alcohol/week: 1.0 - 2.0 standard drinks    Types: 1 - 2 Standard drinks or equivalent per week    Comment: social  . Drug use: No     Colonoscopy:  May 2016  PAP:  Bone density: 07/01/2016 at [facility] showed normal, with a T score of -0.7  Lipid panel:  Allergies  Allergen Reactions  . Nsaids Other (See Comments)    H/o gastric bypass - avoid NSAIDs  . Tape Hives, Rash and Other (See Comments)    Adhesives in bandaids    Current Outpatient Medications  Medication Sig Dispense Refill  . acetaminophen (TYLENOL) 325 MG tablet Take 650 mg by mouth 2 (two) times daily as needed for moderate pain or headache.     . Blood Glucose Monitoring Suppl (ONETOUCH VERIO FLEX SYSTEM) w/Device KIT 1 kit by Does not apply route 4 (four) times daily. Test blood sugars four times daily 1 kit 0  . buPROPion (WELLBUTRIN SR) 200 MG 12 hr tablet TAKE 1 TABLET BY MOUTH DAILY AT 12 NOON. 30 tablet 0  . CALCIUM CITRATE-VITAMIN D PO Take 1 tablet by mouth 3 (three) times daily.     . cholecalciferol (VITAMIN D) 1000 units tablet Take 1,000 Units by mouth daily.    . cholestyramine (QUESTRAN) 4 g packet Take 1 packet (4 g total) by mouth 2 (two) times daily as needed. 10 each 12  . Cyanocobalamin (VITAMIN B-12 PO) Take 1 tablet by mouth every 14 (fourteen) days.    . diphenhydrAMINE (BENADRYL) 25 MG tablet Take 25 mg by  mouth daily as needed for allergies or sleep.     . DUREZOL 0.05 % EMUL     . glucose blood (ONETOUCH VERIO) test strip Use as instructed 100 each 0  . Insulin Glargine (LANTUS SOLOSTAR) 100 UNIT/ML Solostar Pen Inject 12 Units into the skin daily. 5 pen  0  . Insulin Pen Needle (BD PEN NEEDLE NANO U/F) 32G X 4 MM MISC 1 Package by Does not apply route daily at 12 noon. 100 each 0  . liraglutide (VICTOZA) 18 MG/3ML SOPN INJECT 1.8MG(0.3 MLS) INTO THE SKIN EVERY MORNING 3 pen 0  . loratadine (CLARITIN) 10 MG tablet Take 10 mg by mouth as needed for allergies.    [name] losartan (COZAAR) 100 MG tablet Take 1 tablet (100 mg total) by mouth daily. 30 tablet 0  . metFORMIN (GLUCOPHAGE) 500 MG tablet Take 1 tablet (500 mg total) by mouth daily. 30 tablet 0  . ofloxacin (OCUFLOX) 0.3 % ophthalmic solution     . OneTouch Delica Lancets 62M MISC 1 Package by Does not apply route daily. 100 each 0  . Pediatric Multivitamins-Iron (FLINTSTONES PLUS IRON) chewable tablet Chew 1 tablet by mouth 2 (two) times daily.    . Probiotic Product (CVS PROBIOTIC PO) Take 1 capsule by mouth daily.     . prochlorperazine (COMPAZINE) 10 MG tablet Take 1 tablet (10 mg total) by mouth every 6 (six) hours as needed for nausea or vomiting. 30 tablet 0  . Propylene Glycol (SYSTANE BALANCE OP) Place 1-2 drops into both eyes 3 (three) times daily as needed (for dry eyes).     [name] senna-docusate (SENNA PLUS) 8.6-50 MG tablet Take 1 tablet by mouth daily as needed for moderate constipation.     [name] tobramycin (TOBREX) 0.3 % ophthalmic solution     . topiramate (TOPAMAX) 25 MG tablet Take 1 tablet (25 mg total) by mouth daily. 30 tablet 0  . UNABLE TO FIND     . vortioxetine HBr (TRINTELLIX) 20 MG TABS tablet Take 1 tablet (20 mg total) by mouth at bedtime. 30 tablet 0   No current facility-administered medications for this visit.     OBJECTIVE: Latest white woman who appears stated age  37:   10/27/18 1315  BP: (!) 133/54   Pulse: 83  Resp: 18  Temp: 98.3 F (36.8 C)  SpO2: 98%     Body mass index is 27.7 kg/m.     ECOG FS:1 - Symptomatic but completely ambulatory   Filed Weights   10/27/18 1315  Weight: 187 lb 9.6 oz (85.1 kg)  GENERAL: Patient is a well appearing female in no acute distress HEENT:  Sclerae anicteric.  Oropharynx clear and moist. No ulcerations or evidence of oropharyngeal candidiasis. Neck is supple.  NODES:  No cervical, supraclavicular, or axillary lymphadenopathy palpated.  BREAST EXAM:  Deferred. LUNGS:  Clear to auscultation bilaterally.  No wheezes or rhonchi. HEART:  Regular rate and rhythm. No murmur appreciated. ABDOMEN:  Soft, nontender.  Positive, normoactive bowel sounds. No organomegaly palpated. MSK:  No focal spinal tenderness to palpation. Full range of motion bilaterally in the upper extremities. EXTREMITIES:  No peripheral edema.   SKIN:  Clear with no obvious rashes or skin changes. No nail dyscrasia. NEURO:  Nonfocal. Well oriented.  Appropriate affect.    LAB RESULTS:  CMP     Component Value Date/Time   NA 140 10/27/2018 1308   NA 143 03/11/2017 0841   NA 140 01/07/2017 0758   K 4.3 10/27/2018 1308   K 4.1 01/07/2017 0758   CL 111 10/27/2018 1308   CO2 20 (L) 10/27/2018 1308   CO2 24 01/07/2017 0758   GLUCOSE 132 (H) 10/27/2018 1308   GLUCOSE 134 01/07/2017 0758   BUN 33 (H) 10/27/2018 1308   BUN 40 (H) 03/11/2017  0841   BUN 33.6 (H) 01/07/2017 0758   CREATININE 1.81 (H) 10/27/2018 1308   CREATININE 1.62 (H) 04/28/2018 0915   CREATININE 1.5 (H) 01/07/2017 0758   CALCIUM 9.2 10/27/2018 1308   CALCIUM 9.3 01/07/2017 0758   PROT 6.8 10/27/2018 1308   PROT 6.9 03/11/2017 0841   PROT 6.7 01/07/2017 0758   ALBUMIN 4.0 10/27/2018 1308   ALBUMIN 4.5 03/11/2017 0841   ALBUMIN 3.6 01/07/2017 0758   AST 53 (H) 10/27/2018 1308   AST 43 (H) 04/28/2018 0915   AST 51 (H) 01/07/2017 0758   ALT 81 (H) 10/27/2018 1308   ALT 61 (H) 04/28/2018 0915   ALT  82 (H) 01/07/2017 0758   ALKPHOS 111 10/27/2018 1308   ALKPHOS 115 01/07/2017 0758   BILITOT 0.7 10/27/2018 1308   BILITOT 0.6 04/28/2018 0915   BILITOT 0.72 01/07/2017 0758   GFRNONAA 29 (L) 10/27/2018 1308   GFRNONAA 33 (L) 04/28/2018 0915   GFRNONAA 55 (L) 02/18/2013 0827   GFRAA 33 (L) 10/27/2018 1308   GFRAA 38 (L) 04/28/2018 0915   GFRAA 63 02/18/2013 0827    INo results found for: SPEP, UPEP  Lab Results  Component Value Date   WBC 5.2 10/27/2018   NEUTROABS 3.4 10/27/2018   HGB 11.6 (L) 10/27/2018   HCT 34.3 (L) 10/27/2018   MCV 94.5 10/27/2018   PLT 146 (L) 10/27/2018      Chemistry      Component Value Date/Time   NA 140 10/27/2018 1308   NA 143 03/11/2017 0841   NA 140 01/07/2017 0758   K 4.3 10/27/2018 1308   K 4.1 01/07/2017 0758   CL 111 10/27/2018 1308   CO2 20 (L) 10/27/2018 1308   CO2 24 01/07/2017 0758   BUN 33 (H) 10/27/2018 1308   BUN 40 (H) 03/11/2017 0841   BUN 33.6 (H) 01/07/2017 0758   CREATININE 1.81 (H) 10/27/2018 1308   CREATININE 1.62 (H) 04/28/2018 0915   CREATININE 1.5 (H) 01/07/2017 0758      Component Value Date/Time   CALCIUM 9.2 10/27/2018 1308   CALCIUM 9.3 01/07/2017 0758   ALKPHOS 111 10/27/2018 1308   ALKPHOS 115 01/07/2017 0758   AST 53 (H) 10/27/2018 1308   AST 43 (H) 04/28/2018 0915   AST 51 (H) 01/07/2017 0758   ALT 81 (H) 10/27/2018 1308   ALT 61 (H) 04/28/2018 0915   ALT 82 (H) 01/07/2017 0758   BILITOT 0.7 10/27/2018 1308   BILITOT 0.6 04/28/2018 0915   BILITOT 0.72 01/07/2017 0758       No results found for: LABCA2  No components found for: QMGNO037  No results for input(s): INR in the last 168 hours.  Urinalysis    Component Value Date/Time   COLORURINE YELLOW 07/21/2015 [place] 07/21/2015 2328   LABSPEC 1.020 07/21/2015 2328   PHURINE 6.0 07/21/2015 2328   GLUCOSEU NEGATIVE 07/21/2015 2328   HGBUR TRACE (A) 07/21/2015 2328   BILIRUBINUR NEGATIVE 07/21/2015 2328   BILIRUBINUR  neg 06/29/2013 1017   KETONESUR NEGATIVE 07/21/2015 2328   PROTEINUR 30 (A) 07/21/2015 2328   UROBILINOGEN negative 06/29/2013 1017   NITRITE NEGATIVE 07/21/2015 2328   LEUKOCYTESUR NEGATIVE 07/21/2015 2328    STUDIES: No results found.   ASSESSMENT: 68 y.o. [place] woman status post right breast upper outer quadrant and right axillary lymph node biopsy 07/19/2014 for a cT2  pN1, stage IIB  invasive ductal carcinoma, grade 2, estrogen and progesterone receptor negative,  with an MIB-1 of 90%, and HER-2 amplified  (1) neoadjuvant chemotherapy started 08/08/2014, consisting of carboplatin, docetaxel, trastuzumab and pertuzumab given every 3 weeks 6, completed 11/21/2014  (a) breast MRI after initial 3 cycles shows a significant response  (b) breast MRI after 6 cycles shows a complete radiologic response   (2) trastuzumab continued through 10/02/2015 (to end of capecitabine therapy)  (a) echo 02/13/2015 shows an EF of 60-65%  (b)  echo 05/09/2015 shows an ejection fraction of 60-65%  (c) echocardiogram 06/27/2015 shows an ejection fraction of 55-60%.  (d) echocardiogram 01/22/2017 shows an ejection fraction of 55-60%  (3) right lumpectomy with sentinel lymph node biopsy on 12/27/14 showed a residual  ypT2 ypN1a, stage IIB invasive ductal carcinoma, grade 2, with repeat estrogen and progesterone receptors again negative  (4) status post right oncoplastic surgery (with left reduction mammoplasty) 01/06/2015 showing in the right breast multiple foci of intralymphatic carcinoma in random sections  (5)  Right modified radical mastectomy 02/15/2014 showed no residual invasive disease; there was DCIS but margins were negative; all 15 axillary lymph nodes were clear; ypTis ypN0  (a)  The patient has decided against reconstruction.  (6)  Postmastectomy radiation completed 05/18/2015 with capecitabine sensitization  (7) adjuvant capecitabine continued through 09/27/2015  (8) hepatic steatosis  with increased fibrosis score (at risk for cirrhosis)  (9) thyroid biopsy (left lobe inferiorly) 2 shows benign tissue 06/28/2015  (10) LOCAL RECURRENCE:  (a) PET scan 12/06/2015 Notes a right retropectoral lymph node measuring 0.9 cm with an SUV max of 5.5  (b) repeat PET scan 03/11/2016 finds the right subpectoral lymph node to now measure 1.3 cm with an SUV max of 11.8.  (c) PET scan 05/07/2016 shows a significant response to T-DM 1  (d) PET scan 07/10/2017 is clear except for what appears to be a single new liver lesion   (11) started T-DM1 03/21/2016, completed 8 doses 08/13/2016  (a) PET scan 08/19/2016 shows a good response but measurable residual disease  (b) PET scan on 01/16/2017 shows a continuing response  (12) maintenance trastuzumab started 09/03/2016--changed to every 28 days as of 01/28/2017  (a) repeat echocardiogram 07/08/2016 shows an ejection fraction of 60-65%.  (b) echocardiogram on 10/08/2016 demonstrates LVEF of 55-60% (followed by Dr. Haroldine Laws)  (c) echo 01/22/2017 shows an ejection fraction in the 55-60% range  (d) echo 04/24/2017 shows an ejection fraction in the 55-60% range (recommendation for echo every 6 months by Dr. Aundra Dubin)  (e) trastuzumab changed to T-DM 1 after trastuzumab dose 07/15/2017  METASTATIC DISEASE: July 2019 (13)  Radiation to the area of persistent disease in the right upper chest completed 10/30/2016   (14) T-DM 1 resumed 08/12/2017, repeated every 21 days  (a) baseline liver MRI 08/02/2017 shows a 1.4 cm mass, "hot" on PET 07/10/2017  (b) repeat liver MRI 11/18/2017 after 3 cycles of T-DM 1, shows slight enlargement  (c) T-DM 1 discontinued after 09/23/2017 dose  (15) trastuzumab resumed 11/18/2017, to be repeated every 4 weeks,   (a) changed back to every 3 weeks in 03/2018  (b) echo 09/18/2018 shows an ejection fraction in the 50-55% range.  (c) pertuzumab to be added with 10/06/2018 dose  (16) liver biopsy 11/03/2017 shows  adenocarcinoma, most consistent with an upper gastrointestinal primary; this tumor was estrogen receptor and HER-2 negative  (a) GI workup for occult primary ([place]) negative  (b) PET 12/09/2017 shows no obvious primary.  There is a right subpectoral lymph node noted as well  (c) CA 27-29  and CA 19-9 uninformative; CEA 6.72 OCT 2019   (17) started Abraxane on 12/111/2019, given days 1 and 8 on a 21 day cycle   (a) restaging with MRI liver 02/24/2018 shows a gratifying initial response  (b) PET scan 05/14/2018 shows resolution of the measurable disease in the liver  (c) liver MRI 09/14/2018 shows no change compared to prior PET and MRI.  (d) Abraxane held after 09/01/2018 dose  (18) consider Yttrium or other local treatment to liver per IR if liver recurrence develops   PLAN: Darlene [name] is feeling well today and continues on her treatment with good tolerance.  She receives the Trastuzumab/Pertuzumab every 3 weeks.  We reviewed how to take her diarrhea regimen in detail.    Darlene [name] and I reviewed the increase in her tumor markers and the need to restage if the numbers continue to increase.  Her CEA is down to 2.52 today, indicating some transient change, and so we will continue to follow these.    Darlene [name] and I reviewed the injectable herceptin today.  I reviewed that right now it was studied in the metastatic setting with taxotere.  I discussed with her that it may perhaps be something Dr. Jana Hakim considers in the future, however we need more information on her tumor markers, and treatment tolerance before that is considered.    I recommended healthy diet and exercise.  Darlene [name] will return in 3 weeks for labs, f/u, and her next treatment.  She was recommended to continue with the appropriate pandemic precautions.  She knows to call for any other issue that may develop before the next visit.  A total of (30) minutes of face-to-face time was spent with this patient with greater than 50% of that time in counseling  and care-coordination.  Wilber Bihari, NP  10/27/18 1:56 PM Medical Oncology and Hematology [facility] [address] [place], [place] [address] Tel. [phone]    Fax. [phone]

## 2018-10-27 NOTE — Telephone Encounter (Signed)
DOS: 11/18/2018 SURGICAL PROCEDURES: Darlene [name] Bunionectomy Rt, Double Osteotomy Rt, Hammertoe Repair 2nd Rt CPT CODES: 38177, [id number], [phone] DX CODES: M20.11, M20.41  [employer] Effective Date: 04/22/2018  Notification or Prior Authorization is not required for the requested services  This [employer] Medicare Advantage members plan does not currently require a prior authorization for these services. If you have general questions about the prior authorization requirements, please call us at [phone] or visit [email or web address] > Clinician Resources > Advance and Admission Notification Requirements. The number above acknowledges your notification. Please write this number down for future reference. Notification is not a guarantee of coverage or payment.  Decision ID #:[id number]  The number above acknowledges your inquiry and our response. Please write this number down and refer to it for future inquiries. Coverage and payment for an item or service is governed by the member's benefit plan document, and, if applicable, the provider's participation agreement with the Health Plan.

## 2018-10-28 ENCOUNTER — Encounter (INDEPENDENT_AMBULATORY_CARE_PROVIDER_SITE_OTHER): Payer: Self-pay

## 2018-10-28 LAB — CA 125: Cancer Antigen (CA) 125: 12.7 U/mL (ref 0.0–38.1)

## 2018-10-28 LAB — CANCER ANTIGEN 27.29: CA 27.29: 29.9 U/mL (ref 0.0–38.6)

## 2018-10-29 ENCOUNTER — Ambulatory Visit (INDEPENDENT_AMBULATORY_CARE_PROVIDER_SITE_OTHER): Payer: Medicare Other | Admitting: Family Medicine

## 2018-10-29 ENCOUNTER — Other Ambulatory Visit: Payer: Self-pay

## 2018-10-29 ENCOUNTER — Telehealth: Payer: Self-pay | Admitting: Oncology

## 2018-10-29 VITALS — BP 109/72 | HR 70 | Temp 98.0°F | Ht 69.0 in | Wt 184.0 lb

## 2018-10-29 DIAGNOSIS — I10 Essential (primary) hypertension: Secondary | ICD-10-CM | POA: Diagnosis not present

## 2018-10-29 DIAGNOSIS — E669 Obesity, unspecified: Secondary | ICD-10-CM | POA: Diagnosis not present

## 2018-10-29 DIAGNOSIS — Z6830 Body mass index (BMI) 30.0-30.9, adult: Secondary | ICD-10-CM

## 2018-10-29 DIAGNOSIS — Z794 Long term (current) use of insulin: Secondary | ICD-10-CM

## 2018-10-29 DIAGNOSIS — F3289 Other specified depressive episodes: Secondary | ICD-10-CM

## 2018-10-29 DIAGNOSIS — E1140 Type 2 diabetes mellitus with diabetic neuropathy, unspecified: Secondary | ICD-10-CM | POA: Diagnosis not present

## 2018-10-29 MED ORDER — TOPIRAMATE 25 MG PO TABS: 25.0000 mg | ORAL_TABLET | Freq: Every day | ORAL | 0 refills | Status: DC

## 2018-10-29 MED ORDER — VICTOZA 18 MG/3ML ~~LOC~~ SOPN: PEN_INJECTOR | SUBCUTANEOUS | 0 refills | Status: DC

## 2018-10-29 MED ORDER — VORTIOXETINE HBR 20 MG PO TABS: 20.0000 mg | ORAL_TABLET | Freq: Every day | ORAL | 0 refills | Status: DC

## 2018-10-29 MED ORDER — LOSARTAN POTASSIUM 100 MG PO TABS: 100.0000 mg | ORAL_TABLET | Freq: Every day | ORAL | 0 refills | Status: DC

## 2018-10-29 MED ORDER — METFORMIN HCL 500 MG PO TABS: 500.0000 mg | ORAL_TABLET | Freq: Every day | ORAL | 0 refills | Status: DC

## 2018-10-29 MED ORDER — BUPROPION HCL ER (SR) 200 MG PO TB12: ORAL_TABLET | ORAL | 0 refills | Status: DC

## 2018-10-29 NOTE — Telephone Encounter (Signed)
I left a message regarding schedule  

## 2018-10-30 ENCOUNTER — Telehealth: Payer: Self-pay | Admitting: *Deleted

## 2018-10-30 ENCOUNTER — Encounter: Payer: Self-pay | Admitting: Adult Health

## 2018-10-30 ENCOUNTER — Telehealth: Payer: Self-pay | Admitting: Oncology

## 2018-10-30 NOTE — Telephone Encounter (Signed)
Returned patient's phone call regarding rescheduling an appointment, informed patient dates requested we do not have availability. Re-confirmed appointment date and time.

## 2018-10-30 NOTE — Telephone Encounter (Signed)
"  I have a surgery scheduled for October 28.  I need to reschedule it because I have to have a chemo infusion that day.  My doctor said there was no way around it."  Do you have a date that you would like to reschedule it to?  "I'd like to do it the following week."  Dr. Jacqualyn Posey can't do it that week.  His next available date is December 09, 2018.  "Okay, I guess that's when it will be.  Put me down for then."  I'll get it rescheduled.  "Do I need to call someone else to reschedule my post-op appointments?"  I'll take care of it.

## 2018-11-03 NOTE — Progress Notes (Signed)
Office: [phone]  /  Fax: [phone]   HPI:   Chief Complaint: OBESITY Darlene [name] is here to discuss her progress with her obesity treatment plan. She is on the keep a food journal with 1300 to 1500 calories and 70 grams of protein daily plan and she is following her eating plan approximately 80 % of the time. She states she is exercising 0 minutes 0 times per week. We discussed goal weight and decided that Darlene [name]'s goal weight is 180 pounds (BMI 26.5) taking into consideration her metastatic breast cancer. She will be having foot surgery, the end of this month, and she would like to wait to follow up with [name] after that. She reports that her husband wants her to eat a full meal of carbs, veggie and meat, but she usually resists.  Her weight is 184 lb (83.5 kg) today and has had a weight loss of 6 pounds over a period of 3 weeks since her last visit. She has lost 41 lbs since starting treatment with [name].  Hypertension Darlene [name] is a 68 y.o. female with hypertension. [name]'s blood pressure is well controlled on Losartan. Darlene [name] denies chest pain or shortness of breath on exertion. She is working weight loss to help control her blood pressure with the goal of decreasing her risk of heart attack and stroke.  BP Readings from Last 3 Encounters:  10/29/18 109/72  10/27/18 (!) 133/54  10/08/18 114/71    Diabetes II with neuropathy, on insulin Darlene [name] has a diagnosis of diabetes type II. Diabetes is very well controlled on Victoza, Lantus and Metformin. We discussed possibly stopping Lantus. Her last A1c was at 5.4 on 08/27/18. Darlene [name] denies any hypoglycemic episodes. She has been working on intensive lifestyle modifications including diet, exercise, and weight loss to help control her blood glucose levels.  Depression with emotional eating behaviors Darlene [name] is struggling with emotional eating and using food for comfort to the extent that it is negatively impacting  her health. She often snacks when she is not hungry. Darlene [name] sometimes feels she is out of control and then feels guilty that she made poor food choices. She has been working on behavior modification techniques to help reduce her emotional eating and has been somewhat successful. Her mood is stable. She shows no sign of suicidal or homicidal ideations.  ASSESSMENT AND PLAN:  Essential hypertension - Plan: losartan (COZAAR) 100 MG tablet  Type 2 diabetes mellitus with diabetic neuropathy, with long-term current use of insulin (HCC) - Plan: metFORMIN (GLUCOPHAGE) 500 MG tablet, liraglutide (VICTOZA) 18 MG/3ML SOPN  Other depression - with emotional eating - Plan: buPROPion (WELLBUTRIN SR) 200 MG 12 hr tablet, vortioxetine HBr (TRINTELLIX) 20 MG TABS tablet, topiramate (TOPAMAX) 25 MG tablet  Class 1 obesity with serious comorbidity and body mass index (BMI) of 30.0 to 30.9 in adult, unspecified obesity type - BMI > 30 at start of program  PLAN:  Hypertension We discussed sodium restriction, working on healthy weight loss, and a regular exercise program as the means to achieve improved blood pressure control. Darlene [name] agreed with this plan and agreed to follow up as directed. We will continue to monitor her blood pressure as well as her progress with the above lifestyle modifications. She agrees to continue losartan 100 mg daily #30 with no refills and she will watch for signs of hypotension as she continues her lifestyle modifications.  Diabetes II with neuropathy, on insulin Darlene [name] has been given extensive diabetes education by myself today  including ideal fasting and post-prandial blood glucose readings, individual ideal Hgb A1c goals and hypoglycemia prevention. We discussed the importance of good blood sugar control to decrease the likelihood of diabetic complications such as nephropathy, neuropathy, limb loss, blindness, coronary artery disease, and death. We discussed the importance of  intensive lifestyle modification including diet, exercise and weight loss as the first line treatment for diabetes. Darlene [name] agrees to continue all of her diabetes medications, and she will discuss possibly discontinuing Lantus with Dr. Leafy Ro. Prescriptions were written today for Victoza 1.8 mg subQ daily #3 pens with no refills and Metformin 500 mg qAM #30 with no refills. [name] agrees to follow up as directed.  Depression with Emotional Eating Behaviors We discussed behavior modification techniques today to help Darlene [name] deal with her emotional eating and depression. She has agreed to continue Trintellix 20 mg qHS #30 with no refills, Topamax 25 mg daily #30 with no refills and Bupropion 200 mg daily #30 with no refills and follow up as directed.  Obesity Darlene [name] is currently in the action stage of change. As such, her goal is to continue with weight loss efforts She has agreed to keep a food journal with 1300 to 1500 calories and 70 grams of protein daily Darlene [name] has been instructed to work up to a goal of 150 minutes of combined cardio and strengthening exercise per week for weight loss and overall health benefits. We discussed the following Behavioral Modification Strategies today: planning for success, keep a strict food journal, increasing lean protein intake and dealing with family or coworker sabotage  Darlene [name] has agreed to follow up with our clinic in 5 weeks. She was informed of the importance of frequent follow up visits to maximize her success with intensive lifestyle modifications for her multiple health conditions.  ALLERGIES: Allergies  Allergen Reactions  . Nsaids Other (See Comments)    H/o gastric bypass - avoid NSAIDs  . Tape Hives, Rash and Other (See Comments)    Adhesives in bandaids    MEDICATIONS: Current Outpatient Medications on File Prior to Visit  Medication Sig Dispense Refill  . acetaminophen (TYLENOL) 325 MG tablet Take 650 mg by mouth 2 (two) times  daily as needed for moderate pain or headache.     . Blood Glucose Monitoring Suppl (ONETOUCH VERIO FLEX SYSTEM) w/Device KIT 1 kit by Does not apply route 4 (four) times daily. Test blood sugars four times daily 1 kit 0  . CALCIUM CITRATE-VITAMIN D PO Take 1 tablet by mouth 3 (three) times daily.     . cholecalciferol (VITAMIN D) 1000 units tablet Take 1,000 Units by mouth daily.    . cholestyramine (QUESTRAN) 4 g packet Take 1 packet (4 g total) by mouth 2 (two) times daily as needed. 10 each 12  . Cyanocobalamin (VITAMIN B-12 PO) Take 1 tablet by mouth every 14 (fourteen) days.    . diphenhydrAMINE (BENADRYL) 25 MG tablet Take 25 mg by mouth daily as needed for allergies or sleep.     . DUREZOL 0.05 % EMUL     . glucose blood (ONETOUCH VERIO) test strip Use as instructed 100 each 0  . Insulin Glargine (LANTUS SOLOSTAR) 100 UNIT/ML Solostar Pen Inject 12 Units into the skin daily. 5 pen 0  . Insulin Pen Needle (BD PEN NEEDLE NANO U/F) 32G X 4 MM MISC 1 Package by Does not apply route daily at 12 noon. 100 each 0  . loratadine (CLARITIN) 10 MG tablet Take 10 mg by mouth as  needed for allergies.    [name] ofloxacin (OCUFLOX) 0.3 % ophthalmic solution     . OneTouch Delica Lancets 42V MISC 1 Package by Does not apply route daily. 100 each 0  . Pediatric Multivitamins-Iron (FLINTSTONES PLUS IRON) chewable tablet Chew 1 tablet by mouth 2 (two) times daily.    . Probiotic Product ([employer] PROBIOTIC PO) Take 1 capsule by mouth daily.     . prochlorperazine (COMPAZINE) 10 MG tablet Take 1 tablet (10 mg total) by mouth every 6 (six) hours as needed for nausea or vomiting. 30 tablet 0  . Propylene Glycol (SYSTANE BALANCE OP) Place 1-2 drops into both eyes 3 (three) times daily as needed (for dry eyes).     [name] senna-docusate (SENNA PLUS) 8.6-50 MG tablet Take 1 tablet by mouth daily as needed for moderate constipation.     [name] tobramycin (TOBREX) 0.3 % ophthalmic solution     . UNABLE TO FIND      No current  facility-administered medications on file prior to visit.     PAST MEDICAL HISTORY: Past Medical History:  Diagnosis Date  . Anemia    hx of - during 1st round chemo  . Anxiety   . Arthritis    in fingers, shoulders  . Back pain   . Balance problem   . Breast cancer ([place])   . Breast cancer of upper-outer quadrant of right female breast ([place]) 07/22/2014  . Bunion, right foot   . Clumsiness   . Constipation   . Depression   . Diabetes mellitus without complication ([place])    95+ years  . Diabetic retinopathy ([place])    torn retina  . Eating disorder    binge eating  . Epistaxis 08/26/2014  . Fatty liver   . HCAP (healthcare-associated pneumonia) 12/18/2014  . Headache   . Heart murmur    never had any problems  . History of radiation therapy 04/05/15-05/19/15   right chest wall was treated to 50.4 Gy in 28 fractions, right mastectomy scar was treated to 10 Gy in 5 fractions  . Hyperlipidemia   . Hypertension   . Joint pain   . Multiple food allergies   . Neuromuscular disorder ([place])    diabetic neuropathy  . Obesity   . Obstructive sleep apnea on CPAP    uses cpap nightly  . Ovarian cyst rupture    possible  . Pneumonia   . Pneumonia 11/2014  . Radiation 04/05/15-05/19/15   right chest wall 50.4 Gy, mastectomy scar 10 Gy  . Sleep apnea   . Thyroid nodule 06/2014    PAST SURGICAL HISTORY: Past Surgical History:  Procedure Laterality Date  . APPENDECTOMY    . BIOPSY THYROID Bilateral 06/2014   2 nodules - Benign   . BREAST LUMPECTOMY WITH RADIOACTIVE SEED AND SENTINEL LYMPH NODE BIOPSY Right 12/27/2014   Procedure: BREAST LUMPECTOMY WITH RADIOACTIVE SEED AND SENTINEL LYMPH NODE BIOPSY;  Surgeon: Stark Klein, MD;  Location: [facility];  Service: General;  Laterality: Right;  . BREAST REDUCTION SURGERY Bilateral 01/06/2015   Procedure: BILATERAL BREAST REDUCTION, RIGHT ONCOPLASTIC RECONSTRUCTION),LEFT BREAST REDUCTION FOR SYMMETRY;  Surgeon: [name],  MD;  Location: [facility];  Service: Plastics;  Laterality: Bilateral;  . BREATH TEK H PYLORI N/A 09/15/2012   Procedure: BREATH TEK H PYLORI;  Surgeon: Pedro Earls, MD;  Location: [facility];  Service: General;  Laterality: N/A;  . BUNIONECTOMY Left 12/2013  . CARPAL TUNNEL RELEASE Right   . CARPAL TUNNEL  RELEASE Left   . CATARACT EXTRACTION     6/19  . COLONOSCOPY    . DUPUYTREN CONTRACTURE RELEASE    . GASTRIC ROUX-EN-Y N/A 11/02/2012   Procedure: LAPAROSCOPIC ROUX-EN-Y GASTRIC;  Surgeon: Pedro Earls, MD;  Location: [facility];  Service: General;  Laterality: N/A;  . IR GENERIC HISTORICAL  03/18/2016   IR US GUIDE VASC ACCESS LEFT 03/18/2016 Markus Daft, MD [facility]  . IR GENERIC HISTORICAL  03/18/2016   IR FLUORO GUIDE CV LINE LEFT 03/18/2016 Markus Daft, MD [facility]  . LAPAROSCOPIC APPENDECTOMY N/A 07/22/2015   Procedure: APPENDECTOMY LAPAROSCOPIC;  Surgeon: Michael Boston, MD;  Location: [facility];  Service: General;  Laterality: N/A;  . MASTECTOMY Right 02/15/2015   modified  . MODIFIED MASTECTOMY Right 02/15/2015   Procedure: RIGHT MODIFIED RADICAL MASTECTOMY;  Surgeon: Stark Klein, MD;  Location: [facility];  Service: General;  Laterality: Right;  . PORT-A-CATH REMOVAL Left 10/10/2015   Procedure: PORT REMOVAL;  Surgeon: Stark Klein, MD;  Location: [facility];  Service: General;  Laterality: Left;  . PORTACATH PLACEMENT Left 08/02/2014   Procedure: INSERTION PORT-A-CATH;  Surgeon: Stark Klein, MD;  Location: [facility];  Service: General;  Laterality: Left;  . PORTACATH PLACEMENT N/A 11/05/2016   Procedure: INSERTION PORT-A-CATH;  Surgeon: Stark Klein, MD;  Location: [facility];  Service: General;  Laterality: N/A;  . RETINAL LASER PROCEDURE     retina tear on left eye  . ROTATOR CUFF REPAIR Right 2005  . SCAR REVISION Right 12/08/2015   Procedure: COMPLEX REPAIR RIGHT CHEST 10-15CM;  Surgeon: [name], MD;  Location: [facility];  Service: Plastics;   Laterality: Right;  . TOE SURGERY  1970s   bone spur  . TRIGGER FINGER RELEASE     x3    SOCIAL HISTORY: Social History   Tobacco Use  . Smoking status: Never Smoker  . Smokeless tobacco: Never Used  Substance Use Topics  . Alcohol use: Yes    Alcohol/week: 1.0 - 2.0 standard drinks    Types: 1 - 2 Standard drinks or equivalent per week    Comment: social  . Drug use: No    FAMILY HISTORY: Family History  Problem Relation Age of Onset  . CVA Mother   . Heart disease Mother   . Sudden death Mother   . Diabetes Father   . Heart failure Father   . Hypertension Father   . Kidney disease Father   . Obesity Father   . Hypertension Sister   . Diabetes Brother   . Breast cancer Paternal Grandmother 39  . Diabetes Paternal Uncle   . Ovarian cancer Neg Hx     ROS: Review of Systems  Constitutional: Positive for weight loss.  Respiratory: Negative for shortness of breath (on exertion).   Cardiovascular: Negative for chest pain.  Endo/Heme/Allergies:       Negative for hypoglycemia    PHYSICAL EXAM: Blood pressure 109/72, pulse 70, temperature 98 F (36.7 C), temperature source Oral, height _0  (1.753 m), weight 184 lb (83.5 kg), last menstrual period 10/22/2007, SpO2 99 %. Body mass index is 27.17 kg/m. Physical Exam Vitals signs reviewed.  Constitutional:      Appearance: Normal appearance. She is well-developed. She is obese.  Cardiovascular:     Rate and Rhythm: Normal rate.  Pulmonary:     Effort: Pulmonary effort is normal.  Musculoskeletal: Normal range of motion.  Skin:    General: Skin is warm and  dry.  Neurological:     Mental Status: She is alert and oriented to person, place, and time.  Psychiatric:        Mood and Affect: Mood normal.        Behavior: Behavior normal.     RECENT LABS AND TESTS: BMET    Component Value Date/Time   NA 140 10/27/2018 1308   NA 143 03/11/2017 0841   NA 140 01/07/2017 0758   K 4.3 10/27/2018 1308   K 4.1  01/07/2017 0758   CL 111 10/27/2018 1308   CO2 20 (L) 10/27/2018 1308   CO2 24 01/07/2017 0758   GLUCOSE 132 (H) 10/27/2018 1308   GLUCOSE 134 01/07/2017 0758   BUN 33 (H) 10/27/2018 1308   BUN 40 (H) 03/11/2017 0841   BUN 33.6 (H) 01/07/2017 0758   CREATININE 1.81 (H) 10/27/2018 1308   CREATININE 1.62 (H) 04/28/2018 0915   CREATININE 1.5 (H) 01/07/2017 0758   CALCIUM 9.2 10/27/2018 1308   CALCIUM 9.3 01/07/2017 0758   GFRNONAA 29 (L) 10/27/2018 1308   GFRNONAA 33 (L) 04/28/2018 0915   GFRNONAA 55 (L) 02/18/2013 0827   GFRAA 33 (L) 10/27/2018 1308   GFRAA 38 (L) 04/28/2018 0915   GFRAA 63 02/18/2013 0827   Lab Results  Component Value Date   HGBA1C 5.4 08/27/2018   HGBA1C 5.8 (H) 09/03/2017   HGBA1C 6.1 (H) 03/11/2017   HGBA1C 8.2 (H) 11/05/2016   HGBA1C 8.1 (H) 08/09/2016   Lab Results  Component Value Date   INSULIN 15.3 03/11/2017   INSULIN 18.3 08/09/2016   CBC    Component Value Date/Time   WBC 5.2 10/27/2018 1308   RBC 3.63 (L) 10/27/2018 1308   HGB 11.6 (L) 10/27/2018 1308   HGB 10.6 (L) 12/16/2017 0806   HGB 10.8 (L) 01/07/2017 0758   HCT 34.3 (L) 10/27/2018 1308   HCT 31.9 (L) 01/07/2017 0758   PLT 146 (L) 10/27/2018 1308   PLT 83 (L) 12/16/2017 0806   PLT 126 (L) 01/07/2017 0758   MCV 94.5 10/27/2018 1308   MCV 90.8 01/07/2017 0758   MCH 32.0 10/27/2018 1308   MCHC 33.8 10/27/2018 1308   RDW 13.1 10/27/2018 1308   RDW 14.8 (H) 01/07/2017 0758   LYMPHSABS 1.2 10/27/2018 1308   LYMPHSABS 1.1 01/07/2017 0758   MONOABS 0.5 10/27/2018 1308   MONOABS 0.3 01/07/2017 0758   EOSABS 0.1 10/27/2018 1308   EOSABS 0.1 01/07/2017 0758   EOSABS 0.1 08/09/2016 1229   BASOSABS 0.0 10/27/2018 1308   BASOSABS 0.0 01/07/2017 0758   Iron/TIBC/Ferritin/ %Sat    Component Value Date/Time   IRON 58 02/18/2013 0827   TIBC 305 02/18/2013 0827   FERRITIN 133 10/31/2014 0816   IRONPCTSAT 19 (L) 02/18/2013 0827   Lipid Panel     Component Value Date/Time   CHOL  137 08/27/2018 0814   TRIG 56 08/27/2018 0814   HDL 73 08/27/2018 0814   CHOLHDL 2.9 02/18/2013 0827   VLDL 23 02/18/2013 0827   LDLCALC 53 08/27/2018 0814   Hepatic Function Panel     Component Value Date/Time   PROT 6.8 10/27/2018 1308   PROT 6.9 03/11/2017 0841   PROT 6.7 01/07/2017 0758   ALBUMIN 4.0 10/27/2018 1308   ALBUMIN 4.5 03/11/2017 0841   ALBUMIN 3.6 01/07/2017 0758   AST 53 (H) 10/27/2018 1308   AST 43 (H) 04/28/2018 0915   AST 51 (H) 01/07/2017 0758   ALT 81 (H) 10/27/2018 1308  ALT 61 (H) 04/28/2018 0915   ALT 82 (H) 01/07/2017 0758   ALKPHOS 111 10/27/2018 1308   ALKPHOS 115 01/07/2017 0758   BILITOT 0.7 10/27/2018 1308   BILITOT 0.6 04/28/2018 0915   BILITOT 0.72 01/07/2017 0758      Component Value Date/Time   TSH 1.54 02/07/2017   TSH 2.170 08/09/2016 1229     Ref. Range 08/27/2018 08:14  Vitamin D, 25-Hydroxy Latest Ref Range: 30.0 - 100.0 ng/mL 43.5    OBESITY BEHAVIORAL INTERVENTION VISIT  Today's visit was # 93  Starting weight: 225 lbs Starting date: 08/09/2016 Today's weight : 184 lbs Today's date: 10/29/2018 Total lbs lost to date: 41    10/29/2018  Height _0  (1.753 m)  Weight 184 lb (83.5 kg)  BMI (Calculated) 27.16  BLOOD PRESSURE - SYSTOLIC 347  BLOOD PRESSURE - DIASTOLIC 72   Body Fat % 42.5 %  Total Body Water (lbs) 76.6 lbs    ASK: We discussed the diagnosis of obesity with Darlene [name] today and Tondra agreed to give [name] permission to discuss obesity behavioral modification therapy today.  ASSESS: Kyung has the diagnosis of obesity and her BMI today is 27.16 [place] is in the action stage of change   ADVISE: Christmas was educated on the multiple health risks of obesity as well as the benefit of weight loss to improve her health. She was advised of the need for long term treatment and the importance of lifestyle modifications to improve her current health and to decrease her risk of future health problems.   AGREE: Multiple dietary modification options and treatment options were discussed and  Tejal agreed to follow the recommendations documented in the above note.  ARRANGE: Terrace was educated on the importance of frequent visits to treat obesity as outlined per CMS and USPSTF guidelines and agreed to schedule her next follow up appointment today.  Corey Skains, am acting as Location manager for [employer], FNP-C.  I have reviewed the above documentation for accuracy and completeness, and I agree with the above.  - Dawn Whitmire, FNP-C.

## 2018-11-04 ENCOUNTER — Encounter (INDEPENDENT_AMBULATORY_CARE_PROVIDER_SITE_OTHER): Payer: Self-pay | Admitting: Family Medicine

## 2018-11-04 DIAGNOSIS — I10 Essential (primary) hypertension: Secondary | ICD-10-CM | POA: Insufficient documentation

## 2018-11-16 DIAGNOSIS — M2011 Hallux valgus (acquired), right foot: Secondary | ICD-10-CM | POA: Insufficient documentation

## 2018-11-16 DIAGNOSIS — M2041 Other hammer toe(s) (acquired), right foot: Secondary | ICD-10-CM | POA: Insufficient documentation

## 2018-11-16 NOTE — Progress Notes (Signed)
Subjective: 68 year old female presents the office for prescription given a painful bunion on the right foot.  She said that she has tried shoe modifications, offloading, and significant provement this time she wished to proceed with surgical intervention.  Her last A1c was 5.4.  She denies any claudication symptoms.  No open sores.Denies any systemic complaints such as fevers, chills, nausea, vomiting. No acute changes since last appointment, and no other complaints at this time.   Objective: AAO x3, NAD DP/PT pulses palpable bilaterally, CRT less than 3 seconds Moderate bunion appointment on the right foot.  There is tenderness along the bunion site on the medial first metatarsal head.  Also hammertoe contracture present right second toe which is also causing discomfort.  There is no other areas of tenderness elicited at this time.  No pain with calf compression, swelling, warmth, erythema  Assessment: Right foot bunion, second digit hammertoe deformity  Plan: -All treatment options discussed with the patient including all alternatives, risks, complications.  -We discussed both conservative as well as surgical treatment options.  Ultimately she was to proceed with surgery.  Discussed different options for bunion surgery.  We discussed Lapidus bunionectomy as well.  However she wants to proceed with Noreene Larsson bunionectomy with second digit hammertoe repair. -The incision placement as well as the postoperative course was discussed with the patient. I discussed risks of the surgery which include, but not limited to, infection, bleeding, pain, swelling, need for further surgery, delayed or nonhealing, painful or ugly scar, numbness or sensation changes, over/under correction, recurrence, transfer lesions, further deformity, hardware failure, DVT/PE, loss of toe/foot. Patient understands these risks and wishes to proceed with surgery. The surgical consent was reviewed with the patient all 3 pages were  signed. No promises or guarantees were given to the outcome of the procedure. All questions were answered to the best of my ability. Before the surgery the patient was encouraged to call the office if there is any further questions. The surgery will be performed at the [facility] on an outpatient basis. -CAM boot dispensed for postoperative use -Patient encouraged to call the office with any questions, concerns, change in symptoms.   Trula Slade DPM

## 2018-11-18 ENCOUNTER — Inpatient Hospital Stay: Payer: Medicare Other

## 2018-11-18 ENCOUNTER — Other Ambulatory Visit: Payer: Self-pay

## 2018-11-18 ENCOUNTER — Inpatient Hospital Stay (HOSPITAL_BASED_OUTPATIENT_CLINIC_OR_DEPARTMENT_OTHER): Payer: Medicare Other | Admitting: Adult Health

## 2018-11-18 ENCOUNTER — Encounter: Payer: Self-pay | Admitting: Adult Health

## 2018-11-18 VITALS — BP 111/54 | HR 74 | Temp 98.5°F | Resp 18 | Ht 69.0 in | Wt 187.2 lb

## 2018-11-18 VITALS — BP 124/68 | HR 68 | Temp 98.3°F | Resp 16

## 2018-11-18 DIAGNOSIS — C50411 Malignant neoplasm of upper-outer quadrant of right female breast: Secondary | ICD-10-CM

## 2018-11-18 DIAGNOSIS — C787 Secondary malignant neoplasm of liver and intrahepatic bile duct: Secondary | ICD-10-CM

## 2018-11-18 DIAGNOSIS — Z95828 Presence of other vascular implants and grafts: Secondary | ICD-10-CM

## 2018-11-18 DIAGNOSIS — Z171 Estrogen receptor negative status [ER-]: Secondary | ICD-10-CM

## 2018-11-18 DIAGNOSIS — C50911 Malignant neoplasm of unspecified site of right female breast: Secondary | ICD-10-CM

## 2018-11-18 DIAGNOSIS — Z5112 Encounter for antineoplastic immunotherapy: Secondary | ICD-10-CM | POA: Diagnosis not present

## 2018-11-18 DIAGNOSIS — C801 Malignant (primary) neoplasm, unspecified: Secondary | ICD-10-CM

## 2018-11-18 LAB — COMPREHENSIVE METABOLIC PANEL
ALT: 41 U/L (ref 0–44)
AST: 33 U/L (ref 15–41)
Albumin: 3.8 g/dL (ref 3.5–5.0)
Alkaline Phosphatase: 89 U/L (ref 38–126)
Anion gap: 11 (ref 5–15)
BUN: 33 mg/dL — ABNORMAL HIGH (ref 8–23)
CO2: 21 mmol/L — ABNORMAL LOW (ref 22–32)
Calcium: 9 mg/dL (ref 8.9–10.3)
Chloride: 112 mmol/L — ABNORMAL HIGH (ref 98–111)
Creatinine, Ser: 1.67 mg/dL — ABNORMAL HIGH (ref 0.44–1.00)
GFR calc Af Amer: 37 mL/min — ABNORMAL LOW (ref >60)
GFR calc non Af Amer: 32 mL/min — ABNORMAL LOW (ref >60)
Glucose, Bld: 113 mg/dL — ABNORMAL HIGH (ref 70–99)
Potassium: 4 mmol/L (ref 3.5–5.1)
Sodium: 144 mmol/L (ref 135–145)
Total Bilirubin: 0.5 mg/dL (ref 0.3–1.2)
Total Protein: 6.4 g/dL — ABNORMAL LOW (ref 6.5–8.1)

## 2018-11-18 LAB — CBC WITH DIFFERENTIAL/PLATELET
Abs Immature Granulocytes: 0.02 10*3/uL (ref 0.00–0.07)
Basophils Absolute: 0 10*3/uL (ref 0.0–0.1)
Basophils Relative: 0 %
Eosinophils Absolute: 0.1 10*3/uL (ref 0.0–0.5)
Eosinophils Relative: 3 %
HCT: 32.3 % — ABNORMAL LOW (ref 36.0–46.0)
Hemoglobin: 10.9 g/dL — ABNORMAL LOW (ref 12.0–15.0)
Immature Granulocytes: 0 %
Lymphocytes Relative: 24 %
Lymphs Abs: 1.2 10*3/uL (ref 0.7–4.0)
MCH: 31.8 pg (ref 26.0–34.0)
MCHC: 33.7 g/dL (ref 30.0–36.0)
MCV: 94.2 fL (ref 80.0–100.0)
Monocytes Absolute: 0.4 10*3/uL (ref 0.1–1.0)
Monocytes Relative: 8 %
Neutro Abs: 3.1 10*3/uL (ref 1.7–7.7)
Neutrophils Relative %: 65 %
Platelets: 134 10*3/uL — ABNORMAL LOW (ref 150–400)
RBC: 3.43 MIL/uL — ABNORMAL LOW (ref 3.87–5.11)
RDW: 13.1 % (ref 11.5–15.5)
WBC: 4.9 10*3/uL (ref 4.0–10.5)
nRBC: 0 % (ref 0.0–0.2)

## 2018-11-18 LAB — CEA (IN HOUSE-CHCC): CEA (CHCC-In House): 1.86 ng/mL (ref 0.00–5.00)

## 2018-11-18 MED ORDER — TRASTUZUMAB-ANNS CHEMO 150 MG IV SOLR
6.0000 mg/kg | Freq: Once | INTRAVENOUS | Status: AC
  Administered 2018-11-18: 546 mg via INTRAVENOUS
  Filled 2018-11-18: qty 26

## 2018-11-18 MED ORDER — ACETAMINOPHEN 325 MG PO TABS
650.0000 mg | ORAL_TABLET | Freq: Once | ORAL | Status: AC
  Administered 2018-11-18: 650 mg via ORAL

## 2018-11-18 MED ORDER — SODIUM CHLORIDE 0.9 % IV SOLN
Freq: Once | INTRAVENOUS | Status: AC
  Administered 2018-11-18: 14:00:00 via INTRAVENOUS
  Filled 2018-11-18: qty 250

## 2018-11-18 MED ORDER — SODIUM CHLORIDE 0.9% FLUSH
10.0000 mL | INTRAVENOUS | Status: DC | PRN
  Administered 2018-11-18: 12:00:00 10 mL via INTRAVENOUS
  Filled 2018-11-18: qty 10

## 2018-11-18 MED ORDER — SODIUM CHLORIDE 0.9% FLUSH
10.0000 mL | INTRAVENOUS | Status: DC | PRN
  Administered 2018-11-18: 10 mL
  Filled 2018-11-18: qty 10

## 2018-11-18 MED ORDER — ACETAMINOPHEN 325 MG PO TABS
ORAL_TABLET | ORAL | Status: AC
  Filled 2018-11-18: qty 2

## 2018-11-18 MED ORDER — HEPARIN SOD (PORK) LOCK FLUSH 100 UNIT/ML IV SOLN
500.0000 [IU] | Freq: Once | INTRAVENOUS | Status: AC | PRN
  Administered 2018-11-18: 500 [IU]
  Filled 2018-11-18: qty 5

## 2018-11-18 MED ORDER — DIPHENHYDRAMINE HCL 25 MG PO CAPS
ORAL_CAPSULE | ORAL | Status: AC
  Filled 2018-11-18: qty 1

## 2018-11-18 MED ORDER — DIPHENHYDRAMINE HCL 25 MG PO CAPS
25.0000 mg | ORAL_CAPSULE | Freq: Once | ORAL | Status: AC
  Administered 2018-11-18: 25 mg via ORAL

## 2018-11-18 MED ORDER — SODIUM CHLORIDE 0.9 % IV SOLN
420.0000 mg | Freq: Once | INTRAVENOUS | Status: AC
  Administered 2018-11-18: 420 mg via INTRAVENOUS
  Filled 2018-11-18: qty 14

## 2018-11-18 NOTE — Progress Notes (Signed)
[place]  Telephone:(336) [phone] Fax:(336) [phone]    ID: [name] DOB: Aug 29, 1950  MR#: [id number]  ZJI#:[id number]  Patient Care Team: Starlyn Skeans, MD as PCP - General (Family Medicine) Stark Klein, MD as Consulting Physician (General Surgery) Magrinat, Virgie Dad, MD as Consulting Physician (Oncology) Mauro Kaufmann, RN as Registered Nurse Rockwell Germany, RN as Registered Nurse Jacelyn Pi, MD as Consulting Physician (Endocrinology) [name], MD as Consulting Physician (Plastic Surgery) Larey Dresser, MD as Consulting Physician (Cardiology) Gery Pray, MD as Consulting Physician (Radiation Oncology) Juanita Craver, MD as Consulting Physician (Gastroenterology) Dohmeier, Asencion Partridge, MD as Consulting Physician (Neurology)  OTHER MD:   CHIEF COMPLAINT:  HER-2 positive, estrogen receptor negative locally recurrent disease  CURRENT TREATMENT:  Trastuzumab; Pertuzumab   INTERVAL HISTORY:  Darlene [name] returns [name] for follow-up and treatment of her estrogen receptor negative but HER-2 amplified breast cancer.    [name] continues on Trastuzumab/Pertuzumab every 3 weeks.  She is experiencing diarrhea with the addition of Pertuzumab.  She has diarrhea about once a day, and manages this with Imodium 52m.  She is drinking water in response to her diarrhea.   Her CEA was previously elevated but has since declined again from 5.01 to 2.52.  REVIEW OF SYSTEMS: Darlene [name].  She has no new issues.  She denies any new issues such as fever, chills, chest pain, palpitations, cough, shortness of breath, bowel/bladder changes, or any other concerns.  A detailed ROS was non contributory [name].    She does share that she is down to 185 pounds, and that this is wonderful news for her since at her highest she was 330 pounds.  She notes she worked very hard to lose the weight and is delighted in her accomplishment.  BREAST CANCER HISTORY:  From the original intake note:  "Darlene [name] had screening mammography at her gynecologist suggestive of a possible abnormality in the right breast. However [facility] More recently however, she noted a lump in her right breast and brought it to her gynecologist's attention. On 07/18/2014 the patient [facility] The breast density was category B. In the right breast upper outer quadrant there was an area of focal asymmetry with indistinct margins..Marland KitchenUltrasound was obtained and showed in addition to the mass in question, measuring 1.7 cm, an abnormal-appearing lymph node in the right axillary tail.  On 07/19/2014 the patient underwent biopsy of the right breast massin question as well as a right axillary lymph node. Both were positive for invasive ductal carcinoma, grade 2, estrogen and progesterone receptor negative, with an MIB-1 of 90%, and HER-2 pending. Incidentally the lymph node biopsy showed a lymphocytic inflammatory component but no lymph node architecture.  Her subsequent history is as detailed above.   PAST MEDICAL HISTORY: Past Medical History:  Diagnosis Date  . Anemia    hx of - during 1st round chemo  . Anxiety   . Arthritis    in fingers, shoulders  . Back pain   . Balance problem   . Breast cancer ([facility]   . Breast cancer of upper-outer quadrant of right female breast ([facility] 07/22/2014  . Bunion, right foot   . Clumsiness   . Constipation   . Depression   . Diabetes mellitus without complication ([facility]    217[facility]years  . Diabetic retinopathy ([facility]    torn retina  . Eating disorder  binge eating  . Epistaxis 08/26/2014  . Fatty liver   . HCAP (healthcare-associated pneumonia) 12/18/2014  . Headache   . Heart murmur    never had any problems  . History of radiation therapy 04/05/15-05/19/15   right chest wall was treated to 50.4 Gy in 28 fractions, right mastectomy scar was  treated to 10 Gy in 5 fractions  . Hyperlipidemia   . Hypertension   . Joint pain   . Multiple food allergies   . Neuromuscular disorder ([place])    diabetic neuropathy  . Obesity   . Obstructive sleep apnea on CPAP    uses cpap nightly  . Ovarian cyst rupture    possible  . Pneumonia   . Pneumonia 11/2014  . Radiation 04/05/15-05/19/15   right chest wall 50.4 Gy, mastectomy scar 10 Gy  . Sleep apnea   . Thyroid nodule 06/2014    PAST SURGICAL HISTORY: Past Surgical History:  Procedure Laterality Date  . APPENDECTOMY    . BIOPSY THYROID Bilateral 06/2014   2 nodules - Benign   . BREAST LUMPECTOMY WITH RADIOACTIVE SEED AND SENTINEL LYMPH NODE BIOPSY Right 12/27/2014   Procedure: BREAST LUMPECTOMY WITH RADIOACTIVE SEED AND SENTINEL LYMPH NODE BIOPSY;  Surgeon: Stark Klein, MD;  Location: [facility];  Service: General;  Laterality: Right;  . BREAST REDUCTION SURGERY Bilateral 01/06/2015   Procedure: BILATERAL BREAST REDUCTION, RIGHT ONCOPLASTIC RECONSTRUCTION),LEFT BREAST REDUCTION FOR SYMMETRY;  Surgeon: [name], MD;  Location: [facility];  Service: Plastics;  Laterality: Bilateral;  . BREATH TEK H PYLORI N/A 09/15/2012   Procedure: BREATH TEK H PYLORI;  Surgeon: Pedro Earls, MD;  Location: [facility];  Service: General;  Laterality: N/A;  . BUNIONECTOMY Left 12/2013  . CARPAL TUNNEL RELEASE Right   . CARPAL TUNNEL RELEASE Left   . CATARACT EXTRACTION     6/19  . COLONOSCOPY    . DUPUYTREN CONTRACTURE RELEASE    . GASTRIC ROUX-EN-Y N/A 11/02/2012   Procedure: LAPAROSCOPIC ROUX-EN-Y GASTRIC;  Surgeon: Pedro Earls, MD;  [facility];  Service: General;  Laterality: N/A;  . IR GENERIC HISTORICAL  03/18/2016   IR US GUIDE VASC ACCESS LEFT 03/18/2016 Markus Daft, MD [facility]  . IR GENERIC HISTORICAL  03/18/2016   IR FLUORO GUIDE CV LINE LEFT 03/18/2016 Markus Daft, MD [facility]  . LAPAROSCOPIC APPENDECTOMY N/A 07/22/2015   Procedure: APPENDECTOMY  LAPAROSCOPIC;  Surgeon: Michael Boston, MD;  [facility];  Service: General;  Laterality: N/A;  . MASTECTOMY Right 02/15/2015   modified  . MODIFIED MASTECTOMY Right 02/15/2015   Procedure: RIGHT MODIFIED RADICAL MASTECTOMY;  Surgeon: Stark Klein, MD;  Location: [facility];  Service: General;  Laterality: Right;  . PORT-A-CATH REMOVAL Left 10/10/2015   Procedure: PORT REMOVAL;  Surgeon: Stark Klein, MD;  Location: [facility];  Service: General;  Laterality: Left;  . PORTACATH PLACEMENT Left 08/02/2014   Procedure: INSERTION PORT-A-CATH;  Surgeon: Stark Klein, MD;  Location: [facility];  Service: General;  Laterality: Left;  . PORTACATH PLACEMENT N/A 11/05/2016   Procedure: INSERTION PORT-A-CATH;  Surgeon: Stark Klein, MD;  Location: [facility];  Service: General;  Laterality: N/A;  . RETINAL LASER PROCEDURE     retina tear on left eye  . ROTATOR CUFF REPAIR Right 2005  . SCAR REVISION Right 12/08/2015   Procedure: COMPLEX REPAIR RIGHT CHEST 10-15CM;  Surgeon: [name], MD;  Location: [facility];  Service: Plastics;  Laterality: Right;  .  TOE SURGERY  1970s   bone spur  . TRIGGER FINGER RELEASE     x3    FAMILY HISTORY Family History  Problem Relation Age of Onset  . CVA Mother   . Heart disease Mother   . Sudden death Mother   . Diabetes Father   . Heart failure Father   . Hypertension Father   . Kidney disease Father   . Obesity Father   . Hypertension Sister   . Diabetes Brother   . Breast cancer Paternal Grandmother 58  . Diabetes Paternal Uncle   . Ovarian cancer Neg Hx    The patient's father died from complications of diabetes at age 23. The patient's mother died at age 46 with a subarachnoid hemorrhage. The patient had one brother, one sister. The only breast cancer was the patient's paternal grandmother diagnosed in her 44s. There is no history of ovarian cancer in the family.   GYNECOLOGIC HISTORY:  Patient's last menstrual period was  10/22/2007.  menarche age 42, the patient is GX P0. She went through the change of life in 2011. She did not take hormone replacement.   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 Her husband [name] is a retired Visual merchandiser (he taught at [employer]).  In May 2020 [place].  [name] has 2 children from an earlier marriage, Rockey Situ who teaches in [place], and Rajah Lamba,  who works at the [employer] in in [place]. He has 1 grand son, aged 39. The patient and her husband are not church attenders.   ADVANCED DIRECTIVES:  Not in place   HEALTH MAINTENANCE: Social History   Tobacco Use  . Smoking status: Never Smoker  . Smokeless tobacco: Never Used  Substance Use Topics  . Alcohol use: Yes    Alcohol/week: 1.0 - 2.0 standard drinks    Types: 1 - 2 Standard drinks or equivalent per week    Comment: social  . Drug use: No     Colonoscopy:  May 2016  PAP:  B[facility] density: 07/01/[facility] showed normal, with a T score of -0.7  Lipid panel:  Allergies  Allergen Reactions  . Nsaids Other (See Comments)    H/o gastric bypass - avoid NSAIDs  . Tape Hives, Rash and Other (See Comments)    Adhesives in bandaids    Current Outpatient Medications  Medication Sig Dispense Refill  . acetaminophen (TYLENOL) 325 MG tablet Take 650 mg by mouth 2 (two) times daily as needed for moderate pain or headache.     . Blood Glucose Monitoring Suppl (ONETOUCH VERIO FLEX SYSTEM) w/Device KIT 1 kit by Does not apply route 4 (four) times daily. Test blood sugars four times daily 1 kit 0  . buPROPion (WELLBUTRIN SR) 200 MG 12 hr tablet TAKE 1 TABLET BY MOUTH DAILY AT 12 NOON. 30 tablet 0  . CALCIUM CITRATE-VITAMIN D PO Take 1 tablet by mouth 3 (three) times daily.     . cholecalciferol  (VITAMIN D) 1000 units tablet Take 1,000 Units by mouth daily.    . cholestyramine (QUESTRAN) 4 g packet Take 1 packet (4 g total) by mouth 2 (two) times daily as needed. 10 each 12  . Cyanocobalamin (VITAMIN B-12 PO) Take 1 tablet by mouth every 14 (fourteen) days.    [name]  diphenhydrAMINE (BENADRYL) 25 MG tablet Take 25 mg by mouth daily as needed for allergies or sleep.     . DUREZOL 0.05 % EMUL     . glucose blood (ONETOUCH VERIO) test strip Use as instructed 100 each 0  . Insulin Glargine (LANTUS SOLOSTAR) 100 UNIT/ML Solostar Pen Inject 12 Units into the skin daily. 5 pen 0  . Insulin Pen Needle (BD PEN NEEDLE NANO U/F) 32G X 4 MM MISC 1 Package by Does not apply route daily at 12 noon. 100 each 0  . liraglutide (VICTOZA) 18 MG/3ML SOPN INJECT 1.8MG(0.3 MLS) INTO THE SKIN EVERY MORNING 3 pen 0  . loratadine (CLARITIN) 10 MG tablet Take 10 mg by mouth as needed for allergies.    [name] losartan (COZAAR) 100 MG tablet Take 1 tablet (100 mg total) by mouth daily. 30 tablet 0  . metFORMIN (GLUCOPHAGE) 500 MG tablet Take 1 tablet (500 mg total) by mouth daily. 30 tablet 0  . ofloxacin (OCUFLOX) 0.3 % ophthalmic solution     . OneTouch Delica Lancets 56L MISC 1 Package by Does not apply route daily. 100 each 0  . Pediatric Multivitamins-Iron (FLINTSTONES PLUS IRON) chewable tablet Chew 1 tablet by mouth 2 (two) times daily.    . Probiotic Product (CVS PROBIOTIC PO) Take 1 capsule by mouth daily.     . prochlorperazine (COMPAZINE) 10 MG tablet Take 1 tablet (10 mg total) by mouth every 6 (six) hours as needed for nausea or vomiting. 30 tablet 0  . Propylene Glycol (SYSTANE BALANCE OP) Place 1-2 drops into both eyes 3 (three) times daily as needed (for dry eyes).     [name] senna-docusate (SENNA PLUS) 8.6-50 MG tablet Take 1 tablet by mouth daily as needed for moderate constipation.     [name] tobramycin (TOBREX) 0.3 % ophthalmic solution     . topiramate (TOPAMAX) 25 MG tablet Take 1 tablet (25 mg total) by mouth  daily. 30 tablet 0  . UNABLE TO FIND     . vortioxetine HBr (TRINTELLIX) 20 MG TABS tablet Take 1 tablet (20 mg total) by mouth at bedtime. 30 tablet 0   No current facility-administered medications for this visit.     OBJECTIVE: Latest white woman who appears stated age  1:   11/18/18 1248  BP: (!) 111/54  Pulse: 74  Resp: 18  Temp: 98.5 F (36.9 C)  SpO2: 100%     Body mass index is 27.64 kg/m.     ECOG FS:1 - Symptomatic but completely ambulatory   Filed Weights   11/18/18 1248  Weight: 187 lb 3.2 oz (84.9 kg)  GENERAL: Patient is a well appearing female in no acute distress HEENT:  Sclerae anicteric.  Oropharynx clear and moist. No ulcerations or evidence of oropharyngeal candidiasis. Neck is supple.  NODES:  No cervical, supraclavicular, or axillary lymphadenopathy palpated.  BREAST EXAM:  Deferred. LUNGS:  Clear to auscultation bilaterally.  No wheezes or rhonchi. HEART:  Regular rate and rhythm. No murmur appreciated. ABDOMEN:  Soft, nontender.  Positive, normoactive bowel sounds. No organomegaly palpated. MSK:  No focal spinal tenderness to palpation. Full range of motion bilaterally in the upper extremities. EXTREMITIES:  No peripheral edema.   SKIN:  Clear with no obvious rashes or skin changes. No nail dyscrasia. NEURO:  Nonfocal. Well oriented.  Appropriate affect.    LAB RESULTS:  CMP     Component Value Date/Time   NA 144 11/18/2018 1229   NA 143 03/11/2017 0841  NA 140 01/07/2017 0758   K 4.0 11/18/2018 1229   K 4.1 01/07/2017 0758   CL 112 (H) 11/18/2018 1229   CO2 21 (L) 11/18/2018 1229   CO2 24 01/07/2017 0758   GLUCOSE 113 (H) 11/18/2018 1229   GLUCOSE 134 01/07/2017 0758   BUN 33 (H) 11/18/2018 1229   BUN 40 (H) 03/11/2017 0841   BUN 33.6 (H) 01/07/2017 0758   CREATININE 1.67 (H) 11/18/2018 1229   CREATININE 1.62 (H) 04/28/2018 0915   CREATININE 1.5 (H) 01/07/2017 0758   CALCIUM 9.0 11/18/2018 1229   CALCIUM 9.3 01/07/2017 0758    PROT 6.4 (L) 11/18/2018 1229   PROT 6.9 03/11/2017 0841   PROT 6.7 01/07/2017 0758   ALBUMIN 3.8 11/18/2018 1229   ALBUMIN 4.5 03/11/2017 0841   ALBUMIN 3.6 01/07/2017 0758   AST 33 11/18/2018 1229   AST 43 (H) 04/28/2018 0915   AST 51 (H) 01/07/2017 0758   ALT 41 11/18/2018 1229   ALT 61 (H) 04/28/2018 0915   ALT 82 (H) 01/07/2017 0758   ALKPHOS 89 11/18/2018 1229   ALKPHOS 115 01/07/2017 0758   BILITOT 0.5 11/18/2018 1229   BILITOT 0.6 04/28/2018 0915   BILITOT 0.72 01/07/2017 0758   GFRNONAA 32 (L) 11/18/2018 1229   GFRNONAA 33 (L) 04/28/2018 0915   GFRNONAA 55 (L) 02/18/2013 0827   GFRAA 37 (L) 11/18/2018 1229   GFRAA 38 (L) 04/28/2018 0915   GFRAA 63 02/18/2013 0827    INo results found for: SPEP, UPEP  Lab Results  Component Value Date   WBC 4.9 11/18/2018   NEUTROABS 3.1 11/18/2018   HGB 10.9 (L) 11/18/2018   HCT 32.3 (L) 11/18/2018   MCV 94.2 11/18/2018   PLT 134 (L) 11/18/2018      Chemistry      Component Value Date/Time   NA 144 11/18/2018 1229   NA 143 03/11/2017 0841   NA 140 01/07/2017 0758   K 4.0 11/18/2018 1229   K 4.1 01/07/2017 0758   CL 112 (H) 11/18/2018 1229   CO2 21 (L) 11/18/2018 1229   CO2 24 01/07/2017 0758   BUN 33 (H) 11/18/2018 1229   BUN 40 (H) 03/11/2017 0841   BUN 33.6 (H) 01/07/2017 0758   CREATININE 1.67 (H) 11/18/2018 1229   CREATININE 1.62 (H) 04/28/2018 0915   CREATININE 1.5 (H) 01/07/2017 0758      Component Value Date/Time   CALCIUM 9.0 11/18/2018 1229   CALCIUM 9.3 01/07/2017 0758   ALKPHOS 89 11/18/2018 1229   ALKPHOS 115 01/07/2017 0758   AST 33 11/18/2018 1229   AST 43 (H) 04/28/2018 0915   AST 51 (H) 01/07/2017 0758   ALT 41 11/18/2018 1229   ALT 61 (H) 04/28/2018 0915   ALT 82 (H) 01/07/2017 0758   BILITOT 0.5 11/18/2018 1229   BILITOT 0.6 04/28/2018 0915   BILITOT 0.72 01/07/2017 0758       No results found for: LABCA2  No components found for: LABCA125  No results for input(s): INR in the last  168 hours.  Urinalysis    Component Value Date/Time   COLORURINE YELLOW 07/21/2015 [place] 07/21/2015 2328   LABSPEC 1.020 07/21/2015 2328   PHURINE 6.0 07/21/2015 2328   GLUCOSEU NEGATIVE 07/21/2015 [place] (A) 07/21/2015 [place] 07/21/2015 2328   BILIRUBINUR neg 06/29/2013 [place] 07/21/2015 2328   PROTEINUR 30 (A) 07/21/2015 2328   UROBILINOGEN negative  06/29/2013 1017   NITRITE NEGATIVE 07/21/2015 2328   LEUKOCYTESUR NEGATIVE 07/21/2015 2328    STUDIES: No results found.   ASSESSMENT: 68 y.o.  woman status post right breast upper outer quadrant and right axillary lymph node biopsy 07/19/2014 for a cT2  pN1, stage IIB  invasive ductal carcinoma, grade 2, estrogen and progesterone receptor negative, with an MIB-1 of 90%, and HER-2 amplified  (1) neoadjuvant chemotherapy started 08/08/2014, consisting of carboplatin, docetaxel, trastuzumab and pertuzumab given every 3 weeks 6, completed 11/21/2014  (a) breast MRI after initial 3 cycles shows a significant response  (b) breast MRI after 6 cycles shows a complete radiologic response   (2) trastuzumab continued through 10/02/2015 (to end of capecitabine therapy)  (a) echo 02/13/2015 shows an EF of 60-65%  (b)  echo 05/09/2015 shows an ejection fraction of 60-65%  (c) echocardiogram 06/27/2015 shows an ejection fraction of 55-60%.  (d) echocardiogram 01/22/2017 shows an ejection fraction of 55-60%  (3) right lumpectomy with sentinel lymph node biopsy on 12/27/14 showed a residual  ypT2 ypN1a, stage IIB invasive ductal carcinoma, grade 2, with repeat estrogen and progesterone receptors again negative  (4) status post right oncoplastic surgery (with left reduction mammoplasty) 01/06/2015 showing in the right breast multiple foci of intralymphatic carcinoma in random sections  (5)  Right modified radical mastectomy 02/15/2014 showed no residual invasive  disease; there was DCIS but margins were negative; all 15 axillary lymph nodes were clear; ypTis ypN0  (a)  The patient has decided against reconstruction.  (6)  Postmastectomy radiation completed 05/18/2015 with capecitabine sensitization  (7) adjuvant capecitabine continued through 09/27/2015  (8) hepatic steatosis with increased fibrosis score (at risk for cirrhosis)  (9) thyroid biopsy (left lobe inferiorly) 2 shows benign tissue 06/28/2015  (10) LOCAL RECURRENCE:  (a) PET scan 12/06/2015 Notes a right retropectoral lymph node measuring 0.9 cm with an SUV max of 5.5  (b) repeat PET scan 03/11/2016 finds the right subpectoral lymph node to now measure 1.3 cm with an SUV max of 11.8.  (c) PET scan 05/07/2016 shows a significant response to T-DM 1  (d) PET scan 07/10/2017 is clear except for what appears to be a single new liver lesion   (11) started T-DM1 03/21/2016, completed 8 doses 08/13/2016  (a) PET scan 08/19/2016 shows a good response but measurable residual disease  (b) PET scan on 01/16/2017 shows a continuing response  (12) maintenance trastuzumab started 09/03/2016--changed to every 28 days as of 01/28/2017  (a) repeat echocardiogram 07/08/2016 shows an ejection fraction of 60-65%.  (b) echocardiogram on 10/08/2016 demonstrates LVEF of 55-60% (followed by Dr. Haroldine Laws)  (c) echo 01/22/2017 shows an ejection fraction in the 55-60% range  (d) echo 04/24/2017 shows an ejection fraction in the 55-60% range (recommendation for echo every 6 months by Dr. Aundra Dubin)  (e) trastuzumab changed to T-DM 1 after trastuzumab dose 07/15/2017  METASTATIC DISEASE: July 2019 (13)  Radiation to the area of persistent disease in the right upper chest completed 10/30/2016   (14) T-DM 1 resumed 08/12/2017, repeated every 21 days  (a) baseline liver MRI 08/02/2017 shows a 1.4 cm mass, "hot" on PET 07/10/2017  (b) repeat liver MRI 11/18/2017 after 3 cycles of T-DM 1, shows slight enlargement   (c) T-DM 1 discontinued after 09/23/2017 dose  (15) trastuzumab resumed 11/18/2017, to be repeated every 4 weeks,   (a) changed back to every 3 weeks in 03/2018  (b) echo 09/18/2018 shows an ejection fraction in the 50-55% range.  (c) pertuzumab  added with 10/06/2018 dose  (16) liver biopsy 11/03/2017 shows adenocarcinoma, most consistent with an upper gastrointestinal primary; this tumor was estrogen receptor and HER-2 negative  (a) GI workup for occult primary ([place]) negative  (b) PET 12/09/2017 shows no obvious primary.  There is a right subpectoral lymph node noted as well  (c) CA 27-29 and CA 19-9 uninformative; CEA 6.72 OCT 2019   (17) started Abraxane on 12/111/2019, given days 1 and 8 on a 21 day cycle   (a) restaging with MRI liver 02/24/2018 shows a gratifying initial response  (b) PET scan 05/14/2018 shows resolution of the measurable disease in the liver  (c) liver MRI 09/14/2018 shows no change compared to prior PET and MRI.  (d) Abraxane held after 09/01/2018 dose  (18) consider Yttrium or other local treatment to liver per IR if liver recurrence develops   PLAN: Darlene [name] is feeling well [name] and is receiving Trastuzumab and Pertuzumab every three weeks and is tolerating this moderately well.  She is managing the diarrhea pretty well with imodium, and is tolerating this without any major difficulty.  She will continue with this.  Darlene [name] has no sign of progression of her metastatic breast cancer.  We reviewed the fact that her tumor marker has come back down.  This is reassuring.  She will undergo restaging with MRI liver in 12/2018.  I placed orders for this [name].  We reviewed her labs which were stable, along with her most recent echocardiogram, which was completed in 08/2018.  We are checking her echocardiograms every 6 months.    I congratulated Darlene [name] on her hard work with her weight loss.  She was recommended to keep up with the healthy diet and exercise.    Darlene [name] will continue  on Trastuzumab/Pertuzumab every 3 weeks, and we will see her back in December after she has her restaging liver MRI.  She was recommended to continue with the appropriate pandemic precautions. She knows to call for any questions that may arise between now and her next appointment.  We are happy to see her sooner if needed.  The above plan was reviewed with Dr. Jana Hakim in detail who is in agreement with it.   A total of (20) minutes of face-to-face time was spent with this patient with greater than 50% of that time in counseling and care-coordination.  Wilber Bihari, NP  11/20/18 4:27 PM Medical Oncology and Hematology [facility] [address] [place], [place] [address] Tel. [phone]    Fax. [phone]

## 2018-11-18 NOTE — Patient Instructions (Signed)
[facility] Discharge Instructions for Patients Receiving Chemotherapy  Today you received the following chemotherapy agents: trastuzumab and pertuzumab.  To help prevent nausea and vomiting after your treatment, we encourage you to take your nausea medication as directed.   If you develop nausea and vomiting that is not controlled by your nausea medication, call the clinic.   BELOW ARE SYMPTOMS THAT SHOULD BE REPORTED IMMEDIATELY:  *FEVER GREATER THAN 100.5 F  *CHILLS WITH OR WITHOUT FEVER  NAUSEA AND VOMITING THAT IS NOT CONTROLLED WITH YOUR NAUSEA MEDICATION  *UNUSUAL SHORTNESS OF BREATH  *UNUSUAL BRUISING OR BLEEDING  TENDERNESS IN MOUTH AND THROAT WITH OR WITHOUT PRESENCE OF ULCERS  *URINARY PROBLEMS  *BOWEL PROBLEMS  UNUSUAL RASH Items with * indicate a potential emergency and should be followed up as soon as possible.  Feel free to call the clinic should you have any questions or concerns. The clinic phone number is [phone].  Please show the CHEMO ALERT CARD at check-in to the Emergency Department and triage nurse.   

## 2018-11-19 ENCOUNTER — Telehealth: Payer: Self-pay | Admitting: Oncology

## 2018-11-19 NOTE — Telephone Encounter (Signed)
I could not reach patient regarding schedule  °

## 2018-11-23 ENCOUNTER — Other Ambulatory Visit (INDEPENDENT_AMBULATORY_CARE_PROVIDER_SITE_OTHER): Payer: Self-pay | Admitting: Family Medicine

## 2018-11-23 ENCOUNTER — Encounter: Payer: Medicare Other | Admitting: Podiatry

## 2018-11-23 DIAGNOSIS — E1140 Type 2 diabetes mellitus with diabetic neuropathy, unspecified: Secondary | ICD-10-CM

## 2018-11-23 DIAGNOSIS — Z794 Long term (current) use of insulin: Secondary | ICD-10-CM

## 2018-11-24 ENCOUNTER — Telehealth (HOSPITAL_COMMUNITY): Payer: Self-pay | Admitting: Vascular Surgery

## 2018-11-24 ENCOUNTER — Other Ambulatory Visit (HOSPITAL_COMMUNITY): Payer: Self-pay | Admitting: *Deleted

## 2018-11-24 DIAGNOSIS — Z171 Estrogen receptor negative status [ER-]: Secondary | ICD-10-CM

## 2018-11-24 DIAGNOSIS — C50411 Malignant neoplasm of upper-outer quadrant of right female breast: Secondary | ICD-10-CM

## 2018-11-24 NOTE — Telephone Encounter (Signed)
Left pt message to make f/u appt w/ echo 

## 2018-11-24 NOTE — Progress Notes (Signed)
Pt due for echo and f/u with Dr Aundra Dubin, order placed, message sent to schedulers to arrange

## 2018-11-25 NOTE — Telephone Encounter (Signed)
Patient will need a pre cert for echo. Send back to me when appt is scheduled

## 2018-12-03 ENCOUNTER — Encounter: Payer: Medicare Other | Admitting: Podiatry

## 2018-12-03 ENCOUNTER — Ambulatory Visit (INDEPENDENT_AMBULATORY_CARE_PROVIDER_SITE_OTHER): Payer: Medicare Other | Admitting: Family Medicine

## 2018-12-03 ENCOUNTER — Other Ambulatory Visit: Payer: Self-pay

## 2018-12-03 ENCOUNTER — Encounter (INDEPENDENT_AMBULATORY_CARE_PROVIDER_SITE_OTHER): Payer: Self-pay | Admitting: Family Medicine

## 2018-12-03 VITALS — BP 123/70 | HR 66 | Temp 98.2°F | Ht 69.0 in | Wt 184.0 lb

## 2018-12-03 DIAGNOSIS — E119 Type 2 diabetes mellitus without complications: Secondary | ICD-10-CM

## 2018-12-03 DIAGNOSIS — I10 Essential (primary) hypertension: Secondary | ICD-10-CM

## 2018-12-03 DIAGNOSIS — Z6830 Body mass index (BMI) 30.0-30.9, adult: Secondary | ICD-10-CM | POA: Diagnosis not present

## 2018-12-03 DIAGNOSIS — F3289 Other specified depressive episodes: Secondary | ICD-10-CM | POA: Diagnosis not present

## 2018-12-03 DIAGNOSIS — E669 Obesity, unspecified: Secondary | ICD-10-CM

## 2018-12-03 DIAGNOSIS — Z794 Long term (current) use of insulin: Secondary | ICD-10-CM

## 2018-12-03 MED ORDER — VORTIOXETINE HBR 20 MG PO TABS: 20.0000 mg | ORAL_TABLET | Freq: Every day | ORAL | 1 refills | Status: DC

## 2018-12-03 MED ORDER — METFORMIN HCL 500 MG PO TABS: 500.0000 mg | ORAL_TABLET | Freq: Every day | ORAL | 1 refills | Status: DC

## 2018-12-03 MED ORDER — TOPIRAMATE 25 MG PO TABS: 25.0000 mg | ORAL_TABLET | Freq: Every day | ORAL | 1 refills | Status: DC

## 2018-12-03 MED ORDER — LOSARTAN POTASSIUM 100 MG PO TABS: 100.0000 mg | ORAL_TABLET | Freq: Every day | ORAL | 1 refills | Status: DC

## 2018-12-03 MED ORDER — ONETOUCH VERIO VI STRP: ORAL_STRIP | 1 refills | Status: DC

## 2018-12-03 MED ORDER — LANTUS SOLOSTAR 100 UNIT/ML ~~LOC~~ SOPN: 12.0000 [IU] | PEN_INJECTOR | Freq: Every day | SUBCUTANEOUS | 1 refills | Status: DC

## 2018-12-03 MED ORDER — BUPROPION HCL ER (SR) 200 MG PO TB12: ORAL_TABLET | ORAL | 1 refills | Status: DC

## 2018-12-03 MED ORDER — VICTOZA 18 MG/3ML ~~LOC~~ SOPN: PEN_INJECTOR | SUBCUTANEOUS | 1 refills | Status: DC

## 2018-12-08 ENCOUNTER — Inpatient Hospital Stay: Payer: Medicare Other

## 2018-12-08 ENCOUNTER — Inpatient Hospital Stay: Payer: Medicare Other | Attending: Oncology

## 2018-12-08 ENCOUNTER — Other Ambulatory Visit: Payer: Self-pay

## 2018-12-08 VITALS — BP 127/54 | HR 72 | Temp 98.7°F | Resp 18

## 2018-12-08 DIAGNOSIS — C773 Secondary and unspecified malignant neoplasm of axilla and upper limb lymph nodes: Secondary | ICD-10-CM | POA: Diagnosis not present

## 2018-12-08 DIAGNOSIS — C50411 Malignant neoplasm of upper-outer quadrant of right female breast: Secondary | ICD-10-CM | POA: Insufficient documentation

## 2018-12-08 DIAGNOSIS — Z171 Estrogen receptor negative status [ER-]: Secondary | ICD-10-CM | POA: Insufficient documentation

## 2018-12-08 DIAGNOSIS — Z5112 Encounter for antineoplastic immunotherapy: Secondary | ICD-10-CM | POA: Diagnosis not present

## 2018-12-08 DIAGNOSIS — Z95828 Presence of other vascular implants and grafts: Secondary | ICD-10-CM

## 2018-12-08 DIAGNOSIS — C787 Secondary malignant neoplasm of liver and intrahepatic bile duct: Secondary | ICD-10-CM | POA: Insufficient documentation

## 2018-12-08 DIAGNOSIS — C50911 Malignant neoplasm of unspecified site of right female breast: Secondary | ICD-10-CM

## 2018-12-08 DIAGNOSIS — C801 Malignant (primary) neoplasm, unspecified: Secondary | ICD-10-CM

## 2018-12-08 LAB — CBC WITH DIFFERENTIAL/PLATELET
Abs Immature Granulocytes: 0.02 10*3/uL (ref 0.00–0.07)
Basophils Absolute: 0 10*3/uL (ref 0.0–0.1)
Basophils Relative: 0 %
Eosinophils Absolute: 0.1 10*3/uL (ref 0.0–0.5)
Eosinophils Relative: 3 %
HCT: 32.3 % — ABNORMAL LOW (ref 36.0–46.0)
Hemoglobin: 10.9 g/dL — ABNORMAL LOW (ref 12.0–15.0)
Immature Granulocytes: 0 %
Lymphocytes Relative: 29 %
Lymphs Abs: 1.7 10*3/uL (ref 0.7–4.0)
MCH: 31.6 pg (ref 26.0–34.0)
MCHC: 33.7 g/dL (ref 30.0–36.0)
MCV: 93.6 fL (ref 80.0–100.0)
Monocytes Absolute: 0.5 10*3/uL (ref 0.1–1.0)
Monocytes Relative: 8 %
Neutro Abs: 3.4 10*3/uL (ref 1.7–7.7)
Neutrophils Relative %: 60 %
Platelets: 144 10*3/uL — ABNORMAL LOW (ref 150–400)
RBC: 3.45 MIL/uL — ABNORMAL LOW (ref 3.87–5.11)
RDW: 12.8 % (ref 11.5–15.5)
WBC: 5.7 10*3/uL (ref 4.0–10.5)
nRBC: 0 % (ref 0.0–0.2)

## 2018-12-08 LAB — COMPREHENSIVE METABOLIC PANEL
ALT: 41 U/L (ref 0–44)
AST: 32 U/L (ref 15–41)
Albumin: 4 g/dL (ref 3.5–5.0)
Alkaline Phosphatase: 91 U/L (ref 38–126)
Anion gap: 10 (ref 5–15)
BUN: 36 mg/dL — ABNORMAL HIGH (ref 8–23)
CO2: 20 mmol/L — ABNORMAL LOW (ref 22–32)
Calcium: 9 mg/dL (ref 8.9–10.3)
Chloride: 110 mmol/L (ref 98–111)
Creatinine, Ser: 1.53 mg/dL — ABNORMAL HIGH (ref 0.44–1.00)
GFR calc Af Amer: 41 mL/min — ABNORMAL LOW (ref >60)
GFR calc non Af Amer: 35 mL/min — ABNORMAL LOW (ref >60)
Glucose, Bld: 63 mg/dL — ABNORMAL LOW (ref 70–99)
Potassium: 4.1 mmol/L (ref 3.5–5.1)
Sodium: 140 mmol/L (ref 135–145)
Total Bilirubin: 0.8 mg/dL (ref 0.3–1.2)
Total Protein: 6.6 g/dL (ref 6.5–8.1)

## 2018-12-08 MED ORDER — SODIUM CHLORIDE 0.9% FLUSH
10.0000 mL | INTRAVENOUS | Status: DC | PRN
  Administered 2018-12-08: 10 mL
  Filled 2018-12-08: qty 10

## 2018-12-08 MED ORDER — DIPHENHYDRAMINE HCL 25 MG PO CAPS
25.0000 mg | ORAL_CAPSULE | Freq: Once | ORAL | Status: AC
  Administered 2018-12-08: 25 mg via ORAL

## 2018-12-08 MED ORDER — HEPARIN SOD (PORK) LOCK FLUSH 100 UNIT/ML IV SOLN
500.0000 [IU] | Freq: Once | INTRAVENOUS | Status: AC | PRN
  Administered 2018-12-08: 500 [IU]
  Filled 2018-12-08: qty 5

## 2018-12-08 MED ORDER — ACETAMINOPHEN 325 MG PO TABS
650.0000 mg | ORAL_TABLET | Freq: Once | ORAL | Status: AC
  Administered 2018-12-08: 650 mg via ORAL

## 2018-12-08 MED ORDER — DIPHENHYDRAMINE HCL 25 MG PO CAPS
ORAL_CAPSULE | ORAL | Status: AC
  Filled 2018-12-08: qty 1

## 2018-12-08 MED ORDER — TRASTUZUMAB-ANNS CHEMO 420 MG IV SOLR
6.0000 mg/kg | Freq: Once | INTRAVENOUS | Status: AC
  Administered 2018-12-08: 546 mg via INTRAVENOUS
  Filled 2018-12-08: qty 26

## 2018-12-08 MED ORDER — SODIUM CHLORIDE 0.9 % IV SOLN
420.0000 mg | Freq: Once | INTRAVENOUS | Status: AC
  Administered 2018-12-08: 420 mg via INTRAVENOUS
  Filled 2018-12-08: qty 14

## 2018-12-08 MED ORDER — SODIUM CHLORIDE 0.9% FLUSH
10.0000 mL | INTRAVENOUS | Status: DC | PRN
  Administered 2018-12-08: 10 mL via INTRAVENOUS
  Filled 2018-12-08: qty 10

## 2018-12-08 MED ORDER — ACETAMINOPHEN 325 MG PO TABS
ORAL_TABLET | ORAL | Status: AC
  Filled 2018-12-08: qty 2

## 2018-12-08 MED ORDER — SODIUM CHLORIDE 0.9 % IV SOLN
Freq: Once | INTRAVENOUS | Status: AC
  Administered 2018-12-08: 14:00:00 via INTRAVENOUS
  Filled 2018-12-08: qty 250

## 2018-12-08 NOTE — Patient Instructions (Signed)
[facility] Discharge Instructions for Patients Receiving Chemotherapy  Today you received the following chemotherapy agents: trastuzumab and pertuzumab.  To help prevent nausea and vomiting after your treatment, we encourage you to take your nausea medication as directed.   If you develop nausea and vomiting that is not controlled by your nausea medication, call the clinic.   BELOW ARE SYMPTOMS THAT SHOULD BE REPORTED IMMEDIATELY:  *FEVER GREATER THAN 100.5 F  *CHILLS WITH OR WITHOUT FEVER  NAUSEA AND VOMITING THAT IS NOT CONTROLLED WITH YOUR NAUSEA MEDICATION  *UNUSUAL SHORTNESS OF BREATH  *UNUSUAL BRUISING OR BLEEDING  TENDERNESS IN MOUTH AND THROAT WITH OR WITHOUT PRESENCE OF ULCERS  *URINARY PROBLEMS  *BOWEL PROBLEMS  UNUSUAL RASH Items with * indicate a potential emergency and should be followed up as soon as possible.  Feel free to call the clinic should you have any questions or concerns. The clinic phone number is [phone].  Please show the CHEMO ALERT CARD at check-in to the Emergency Department and triage nurse.   

## 2018-12-08 NOTE — Progress Notes (Signed)
Office: [phone]  /  Fax: [phone]   HPI:   Chief Complaint: OBESITY Darlene [name] is here to discuss her progress with her obesity treatment plan. She is on the keep a food journal with 1300-1500 calories and 70 grams of protein and is following her eating plan approximately 80 % of the time. She states she is exercising 0 minutes 0 times per week. Darlene [name] has done well maintaining her weight. Her BMI is below 30 and she feels well overall. Her hunger is controlled. She has question about holiday eating this year.  Her weight is 184 lb (83.5 kg) today and has not lost weight since her last visit. She has lost 41 lbs since starting treatment with [name].  Diabetes II Darlene [name] has a diagnosis of diabetes type II. Darlene [name] is stable on her medications. She denies nausea, vomiting, or hypoglycemia. Last A1c was 5.4. She has been working on intensive lifestyle modifications including diet, exercise, and weight loss to help control her blood glucose levels.  Hypertension Darlene [name] is a 68 y.o. female with hypertension. Darlene [name]'s blood pressure is well controlled, and she is tolerating losartan well. She denies headaches or dizziness. She is working on weight loss to help control her blood pressure with the goal of decreasing her risk of heart attack and stroke.   Depression with Emotional Eating Behaviors Darlene [name]'s mood has improved, and she is tolerating her medications well. Darlene [name] struggles with emotional eating and using food for comfort to the extent that it is negatively impacting her health. She often snacks when she is not hungry. Darlene [name] sometimes feels she is out of control and then feels guilty that she made poor food choices. She has been working on behavior modification techniques to help reduce her emotional eating and has been somewhat successful. She shows no sign of suicidal or homicidal ideations.  ASSESSMENT AND PLAN:  Essential hypertension - Plan: losartan  (COZAAR) 100 MG tablet  Type 2 diabetes mellitus without complication, with long-term current use of insulin (HCC) - Plan: metFORMIN (GLUCOPHAGE) 500 MG tablet, liraglutide (VICTOZA) 18 MG/3ML SOPN, Insulin Glargine (LANTUS SOLOSTAR) 100 UNIT/ML Solostar Pen, glucose blood (ONETOUCH VERIO) test strip  Other depression - with emotional eating - Plan: buPROPion (WELLBUTRIN SR) 200 MG 12 hr tablet, topiramate (TOPAMAX) 25 MG tablet, vortioxetine HBr (TRINTELLIX) 20 MG TABS tablet  Class 1 obesity with serious comorbidity and body mass index (BMI) of 30.0 to 30.9 in adult, unspecified obesity type - starting BMI greater then 30  PLAN:  Diabetes II Darlene [name] has been given extensive diabetes education by myself today including ideal fasting and post-prandial blood glucose readings, individual ideal Hgb A1c goals and hypoglycemia prevention. We discussed the importance of good blood sugar control to decrease the likelihood of diabetic complications such as nephropathy, neuropathy, limb loss, blindness, coronary artery disease, and death. We discussed the importance of intensive lifestyle modification including diet, exercise and weight loss as the first line treatment for diabetes. Havyn agrees to continue Victoza 1.8 mg SubQ q AM #3 pens and we will refill for 2 months; she agrees to continue taking metformin 500 mg q daily #30 and we will refill for 2 months. Adelfa agrees to continue Lantus 12 units SubQ daily #5 pens and we will refill for 2 months. We will refill glucose test strips #100 for 2 months. Taziyah agrees to follow up with our clinic in 8 weeks.  Hypertension We discussed sodium restriction, working on healthy weight loss, and a regular exercise program  as the means to achieve improved blood pressure control. Darlene [name] agreed with this plan and agreed to follow up as directed. We will continue to monitor her blood pressure as well as her progress with the above lifestyle modifications.  Darlene [name] agrees to continue taking losartan 100 mg q daily #30 and we will refill 2 months. She will watch for signs of hypotension as she continues her lifestyle modifications. Darlene [name] agrees to follow up with our clinic in 8 weeks.  Depression with Emotional Eating Behaviors We discussed behavior modification techniques today to help Darlene [name] deal with her emotional eating and depression. Darlene [name] agrees to continue taking bupropion 200 mg q daily #30 and we will refill for 2 months; she agrees to continue takingTrintellix 20 mg qhs #30 and we will refill for 2 months. Darlene [name] agrees to continue taking Topamax 25 mg q daily #30 and we will refill for 2 months. Darlene [name] agrees to follow up with our clinic in 8 weeks.  Obesity Darlene [name] is currently in the action stage of change. As such, her goal is to continue with weight loss efforts She has agreed to keep a food journal with 1300-1500 calories and 75+ grams of protein daily Darlene [name] has been instructed to work up to a goal of 150 minutes of combined cardio and strengthening exercise per week for weight loss and overall health benefits. Exercise bands were given and exercises were discussed. We discussed the following Behavioral Modification Strategies today: holiday eating strategies    Darlene [name] has agreed to follow up with our clinic in 8 weeks. She was informed of the importance of frequent follow up visits to maximize her success with intensive lifestyle modifications for her multiple health conditions.  ALLERGIES: Allergies  Allergen Reactions  . Nsaids Other (See Comments)    H/o gastric bypass - avoid NSAIDs  . Tape Hives, Rash and Other (See Comments)    Adhesives in bandaids    MEDICATIONS: Current Outpatient Medications on File Prior to Visit  Medication Sig Dispense Refill  . acetaminophen (TYLENOL) 325 MG tablet Take 650 mg by mouth 2 (two) times daily as needed for moderate pain or headache.     . Blood Glucose Monitoring  Suppl (ONETOUCH VERIO FLEX SYSTEM) w/Device KIT 1 kit by Does not apply route 4 (four) times daily. Test blood sugars four times daily 1 kit 0  . CALCIUM CITRATE-VITAMIN D PO Take 1 tablet by mouth 3 (three) times daily.     . cholecalciferol (VITAMIN D) 1000 units tablet Take 1,000 Units by mouth daily.    . cholestyramine (QUESTRAN) 4 g packet Take 1 packet (4 g total) by mouth 2 (two) times daily as needed. 10 each 12  . Cyanocobalamin (VITAMIN B-12 PO) Take 1 tablet by mouth every 14 (fourteen) days.    . diphenhydrAMINE (BENADRYL) 25 MG tablet Take 25 mg by mouth daily as needed for allergies or sleep.     . DUREZOL 0.05 % EMUL     . loratadine (CLARITIN) 10 MG tablet Take 10 mg by mouth as needed for allergies.    [name] ofloxacin (OCUFLOX) 0.3 % ophthalmic solution     . OneTouch Delica Lancets 23J MISC 1 Package by Does not apply route daily. 100 each 0  . Pediatric Multivitamins-Iron (FLINTSTONES PLUS IRON) chewable tablet Chew 1 tablet by mouth 2 (two) times daily.    . Probiotic Product ([employer]S PROBIOTIC PO) Take 1 capsule by mouth daily.     . prochlorperazine (COMPAZINE) 10 MG tablet Take  1 tablet (10 mg total) by mouth every 6 (six) hours as needed for nausea or vomiting. 30 tablet 0  . Propylene Glycol (SYSTANE BALANCE OP) Place 1-2 drops into both eyes 3 (three) times daily as needed (for dry eyes).     [name] senna-docusate (SENNA PLUS) 8.6-50 MG tablet Take 1 tablet by mouth daily as needed for moderate constipation.     . SURE COMFORT PEN NEEDLES 32G X 4 MM MISC USE WITH INJECTIONS 100 each 0  . tobramycin (TOBREX) 0.3 % ophthalmic solution     . UNABLE TO FIND      No current facility-administered medications on file prior to visit.     PAST MEDICAL HISTORY: Past Medical History:  Diagnosis Date  . Anemia    hx of - during 1st round chemo  . Anxiety   . Arthritis    in fingers, shoulders  . Back pain   . Balance problem   . Breast cancer ([place])   . Breast cancer of upper-outer  quadrant of right female breast ([place]) 07/22/2014  . Bunion, right foot   . Clumsiness   . Constipation   . Depression   . Diabetes mellitus without complication ([place])    78+ years  . Diabetic retinopathy ([place])    torn retina  . Eating disorder    binge eating  . Epistaxis 08/26/2014  . Fatty liver   . HCAP (healthcare-associated pneumonia) 12/18/2014  . Headache   . Heart murmur    never had any problems  . History of radiation therapy 04/05/15-05/19/15   right chest wall was treated to 50.4 Gy in 28 fractions, right mastectomy scar was treated to 10 Gy in 5 fractions  . Hyperlipidemia   . Hypertension   . Joint pain   . Multiple food allergies   . Neuromuscular disorder ([place])    diabetic neuropathy  . Obesity   . Obstructive sleep apnea on CPAP    uses cpap nightly  . Ovarian cyst rupture    possible  . Pneumonia   . Pneumonia 11/2014  . Radiation 04/05/15-05/19/15   right chest wall 50.4 Gy, mastectomy scar 10 Gy  . Sleep apnea   . Thyroid nodule 06/2014    PAST SURGICAL HISTORY: Past Surgical History:  Procedure Laterality Date  . APPENDECTOMY    . BIOPSY THYROID Bilateral 06/2014   2 nodules - Benign   . BREAST LUMPECTOMY WITH RADIOACTIVE SEED AND SENTINEL LYMPH NODE BIOPSY Right 12/27/2014   Procedure: BREAST LUMPECTOMY WITH RADIOACTIVE SEED AND SENTINEL LYMPH NODE BIOPSY;  Surgeon: Stark Klein, MD;  Location: [facility];  Service: General;  Laterality: Right;  . BREAST REDUCTION SURGERY Bilateral 01/06/2015   Procedure: BILATERAL BREAST REDUCTION, RIGHT ONCOPLASTIC RECONSTRUCTION),LEFT BREAST REDUCTION FOR SYMMETRY;  Surgeon: [name], MD;  Location: [facility];  Service: Plastics;  Laterality: Bilateral;  . BREATH TEK H PYLORI N/A 09/15/2012   Procedure: BREATH TEK H PYLORI;  Surgeon: Pedro Earls, MD;  Location: [facility];  Service: General;  Laterality: N/A;  . BUNIONECTOMY Left 12/2013  . CARPAL TUNNEL RELEASE Right   . CARPAL TUNNEL RELEASE  Left   . CATARACT EXTRACTION     6/19  . COLONOSCOPY    . DUPUYTREN CONTRACTURE RELEASE    . GASTRIC ROUX-EN-Y N/A 11/02/2012   Procedure: LAPAROSCOPIC ROUX-EN-Y GASTRIC;  Surgeon: Pedro Earls, MD;  Location: [facility];  Service: General;  Laterality: N/A;  . IR GENERIC HISTORICAL  03/18/2016  IR US GUIDE VASC ACCESS LEFT 03/18/2016 Markus Daft, MD [facility]  . IR GENERIC HISTORICAL  03/18/2016   IR FLUORO GUIDE CV LINE LEFT 03/18/2016 Markus Daft, MD [facility]  . LAPAROSCOPIC APPENDECTOMY N/A 07/22/2015   Procedure: APPENDECTOMY LAPAROSCOPIC;  Surgeon: Michael Boston, MD;  Location: [facility];  Service: General;  Laterality: N/A;  . MASTECTOMY Right 02/15/2015   modified  . MODIFIED MASTECTOMY Right 02/15/2015   Procedure: RIGHT MODIFIED RADICAL MASTECTOMY;  Surgeon: Stark Klein, MD;  Location: [facility];  Service: General;  Laterality: Right;  . PORT-A-CATH REMOVAL Left 10/10/2015   Procedure: PORT REMOVAL;  Surgeon: Stark Klein, MD;  Location: [facility];  Service: General;  Laterality: Left;  . PORTACATH PLACEMENT Left 08/02/2014   Procedure: INSERTION PORT-A-CATH;  Surgeon: Stark Klein, MD;  Location: [facility];  Service: General;  Laterality: Left;  . PORTACATH PLACEMENT N/A 11/05/2016   Procedure: INSERTION PORT-A-CATH;  Surgeon: Stark Klein, MD;  Location: [facility];  Service: General;  Laterality: N/A;  . RETINAL LASER PROCEDURE     retina tear on left eye  . ROTATOR CUFF REPAIR Right 2005  . SCAR REVISION Right 12/08/2015   Procedure: COMPLEX REPAIR RIGHT CHEST 10-15CM;  Surgeon: [name], MD;  Location: [facility];  Service: Plastics;  Laterality: Right;  . TOE SURGERY  1970s   bone spur  . TRIGGER FINGER RELEASE     x3    SOCIAL HISTORY: Social History   Tobacco Use  . Smoking status: Never Smoker  . Smokeless tobacco: Never Used  Substance Use Topics  . Alcohol use: Yes    Alcohol/week: 1.0 - 2.0 standard drinks    Types: 1 - 2 Standard  drinks or equivalent per week    Comment: social  . Drug use: No    FAMILY HISTORY: Family History  Problem Relation Age of Onset  . CVA Mother   . Heart disease Mother   . Sudden death Mother   . Diabetes Father   . Heart failure Father   . Hypertension Father   . Kidney disease Father   . Obesity Father   . Hypertension Sister   . Diabetes Brother   . Breast cancer Paternal Grandmother 46  . Diabetes Paternal Uncle   . Ovarian cancer Neg Hx     ROS: Review of Systems  Constitutional: Negative for weight loss.  Gastrointestinal: Negative for nausea and vomiting.  Neurological: Negative for dizziness and headaches.  Endo/Heme/Allergies:       Negative hypoglycemia  Psychiatric/Behavioral: Positive for depression. Negative for suicidal ideas.    PHYSICAL EXAM: Blood pressure 123/70, pulse 66, temperature 98.2 F (36.8 C), temperature source Oral, height _0  (1.753 m), weight 184 lb (83.5 kg), last menstrual period 10/22/2007, SpO2 98 %. Body mass index is 27.17 kg/m. Physical Exam Vitals signs reviewed.  Constitutional:      Appearance: Normal appearance. She is obese.  Cardiovascular:     Rate and Rhythm: Normal rate.     Pulses: Normal pulses.  Pulmonary:     Effort: Pulmonary effort is normal.     Breath sounds: Normal breath sounds.  Musculoskeletal: Normal range of motion.  Skin:    General: Skin is warm and dry.  Neurological:     Mental Status: She is alert and oriented to person, place, and time.  Psychiatric:        Mood and Affect: Mood normal.        Behavior: Behavior  normal.     RECENT LABS AND TESTS: BMET    Component Value Date/Time   NA 144 11/18/2018 1229   NA 143 03/11/2017 0841   NA 140 01/07/2017 0758   K 4.0 11/18/2018 1229   K 4.1 01/07/2017 0758   CL 112 (H) 11/18/2018 1229   CO2 21 (L) 11/18/2018 1229   CO2 24 01/07/2017 0758   GLUCOSE 113 (H) 11/18/2018 1229   GLUCOSE 134 01/07/2017 0758   BUN 33 (H) 11/18/2018 1229    BUN 40 (H) 03/11/2017 0841   BUN 33.6 (H) 01/07/2017 0758   CREATININE 1.67 (H) 11/18/2018 1229   CREATININE 1.62 (H) 04/28/2018 0915   CREATININE 1.5 (H) 01/07/2017 0758   CALCIUM 9.0 11/18/2018 1229   CALCIUM 9.3 01/07/2017 0758   GFRNONAA 32 (L) 11/18/2018 1229   GFRNONAA 33 (L) 04/28/2018 0915   GFRNONAA 55 (L) 02/18/2013 0827   GFRAA 37 (L) 11/18/2018 1229   GFRAA 38 (L) 04/28/2018 0915   GFRAA 63 02/18/2013 0827   Lab Results  Component Value Date   HGBA1C 5.4 08/27/2018   HGBA1C 5.8 (H) 09/03/2017   HGBA1C 6.1 (H) 03/11/2017   HGBA1C 8.2 (H) 11/05/2016   HGBA1C 8.1 (H) 08/09/2016   Lab Results  Component Value Date   INSULIN 15.3 03/11/2017   INSULIN 18.3 08/09/2016   CBC    Component Value Date/Time   WBC 4.9 11/18/2018 1229   RBC 3.43 (L) 11/18/2018 1229   HGB 10.9 (L) 11/18/2018 1229   HGB 10.6 (L) 12/16/2017 0806   HGB 10.8 (L) 01/07/2017 0758   HCT 32.3 (L) 11/18/2018 1229   HCT 31.9 (L) 01/07/2017 0758   PLT 134 (L) 11/18/2018 1229   PLT 83 (L) 12/16/2017 0806   PLT 126 (L) 01/07/2017 0758   MCV 94.2 11/18/2018 1229   MCV 90.8 01/07/2017 0758   MCH 31.8 11/18/2018 1229   MCHC 33.7 11/18/2018 1229   RDW 13.1 11/18/2018 1229   RDW 14.8 (H) 01/07/2017 0758   LYMPHSABS 1.2 11/18/2018 1229   LYMPHSABS 1.1 01/07/2017 0758   MONOABS 0.4 11/18/2018 1229   MONOABS 0.3 01/07/2017 0758   EOSABS 0.1 11/18/2018 1229   EOSABS 0.1 01/07/2017 0758   EOSABS 0.1 08/09/2016 1229   BASOSABS 0.0 11/18/2018 1229   BASOSABS 0.0 01/07/2017 0758   Iron/TIBC/Ferritin/ %Sat    Component Value Date/Time   IRON 58 02/18/2013 0827   TIBC 305 02/18/2013 0827   FERRITIN 133 10/31/2014 0816   IRONPCTSAT 19 (L) 02/18/2013 0827   Lipid Panel     Component Value Date/Time   CHOL 137 08/27/2018 0814   TRIG 56 08/27/2018 0814   HDL 73 08/27/2018 0814   CHOLHDL 2.9 02/18/2013 0827   VLDL 23 02/18/2013 0827   LDLCALC 53 08/27/2018 0814   Hepatic Function Panel      Component Value Date/Time   PROT 6.4 (L) 11/18/2018 1229   PROT 6.9 03/11/2017 0841   PROT 6.7 01/07/2017 0758   ALBUMIN 3.8 11/18/2018 1229   ALBUMIN 4.5 03/11/2017 0841   ALBUMIN 3.6 01/07/2017 0758   AST 33 11/18/2018 1229   AST 43 (H) 04/28/2018 0915   AST 51 (H) 01/07/2017 0758   ALT 41 11/18/2018 1229   ALT 61 (H) 04/28/2018 0915   ALT 82 (H) 01/07/2017 0758   ALKPHOS 89 11/18/2018 1229   ALKPHOS 115 01/07/2017 0758   BILITOT 0.5 11/18/2018 1229   BILITOT 0.6 04/28/2018 0915   BILITOT 0.72 01/07/2017  7011      Component Value Date/Time   TSH 1.54 02/07/2017   TSH 2.170 08/09/2016 1229      OBESITY BEHAVIORAL INTERVENTION VISIT  Today's visit was # 18   Starting weight: 225 lbs Starting date: 08/09/16 Today's weight : 184 lbs Today's date: 12/03/2018 Total lbs lost to date: 41 At least 15 minutes were spent on discussing the following behavioral intervention visit.   ASK: We discussed the diagnosis of obesity with [name] today and Nikira agreed to give [name] permission to discuss obesity behavioral modification therapy today.  ASSESS: [name] has the diagnosis of obesity and her BMI today is 27.16 Deniya is in the action stage of change   ADVISE: Jarrett was educated on the multiple health risks of obesity as well as the benefit of weight loss to improve her health. She was advised of the need for long term treatment and the importance of lifestyle modifications to improve her current health and to decrease her risk of future health problems.  AGREE: Multiple dietary modification options and treatment options were discussed and  Shea agreed to follow the recommendations documented in the above note.  ARRANGE: Lyrik was educated on the importance of frequent visits to treat obesity as outlined per CMS and USPSTF guidelines and agreed to schedule her next follow up appointment today.  I, [name], am acting as transcriptionist for  Dennard Nip, MD  I have reviewed the above documentation for accuracy and completeness, and I agree with the above. -Dennard Nip, MD

## 2018-12-08 NOTE — Patient Instructions (Signed)

## 2018-12-09 ENCOUNTER — Encounter: Payer: Self-pay | Admitting: Podiatry

## 2018-12-09 ENCOUNTER — Other Ambulatory Visit: Payer: Self-pay | Admitting: Podiatry

## 2018-12-09 DIAGNOSIS — M2041 Other hammer toe(s) (acquired), right foot: Secondary | ICD-10-CM | POA: Diagnosis not present

## 2018-12-09 DIAGNOSIS — M2011 Hallux valgus (acquired), right foot: Secondary | ICD-10-CM | POA: Diagnosis not present

## 2018-12-09 MED ORDER — PROMETHAZINE HCL 25 MG PO TABS: 25.0000 mg | ORAL_TABLET | Freq: Three times a day (TID) | ORAL | 0 refills | Status: DC | PRN

## 2018-12-09 MED ORDER — OXYCODONE-ACETAMINOPHEN 5-325 MG PO TABS: 1.0000 | ORAL_TABLET | Freq: Four times a day (QID) | ORAL | 0 refills | Status: DC | PRN

## 2018-12-09 MED ORDER — CEPHALEXIN 500 MG PO CAPS: 500.0000 mg | ORAL_CAPSULE | Freq: Two times a day (BID) | ORAL | 0 refills | Status: DC

## 2018-12-09 NOTE — Progress Notes (Signed)
Post op medications sent to pharmacy 

## 2018-12-11 ENCOUNTER — Telehealth: Payer: Self-pay | Admitting: *Deleted

## 2018-12-11 NOTE — Telephone Encounter (Signed)
Called and spoke with the patient and the patient stated that she was doing fine and that she was icing and elevating her foot and there has been some pain during the night and there is not any fever or chills and no nausea and has taken a total of 3 pain medicine (oxycodone) not all at one time and now is on just tylenol and I stated to call the [place] office if any concerns or questions and that we would see patient next week for her first post op. Darlene [name]

## 2018-12-12 ENCOUNTER — Telehealth: Payer: Self-pay | Admitting: *Deleted

## 2018-12-12 NOTE — Telephone Encounter (Signed)
Patient called this morning asking about the ice pack and how long to ice for and I stated 15 minutes on the hours as needed. Darlene [name]

## 2018-12-15 ENCOUNTER — Other Ambulatory Visit: Payer: Medicare Other

## 2018-12-15 ENCOUNTER — Ambulatory Visit: Payer: Medicare Other

## 2018-12-15 ENCOUNTER — Ambulatory Visit: Payer: Medicare Other | Admitting: Adult Health

## 2018-12-15 ENCOUNTER — Ambulatory Visit (INDEPENDENT_AMBULATORY_CARE_PROVIDER_SITE_OTHER): Payer: Medicare Other

## 2018-12-15 ENCOUNTER — Other Ambulatory Visit: Payer: Self-pay

## 2018-12-15 ENCOUNTER — Ambulatory Visit (INDEPENDENT_AMBULATORY_CARE_PROVIDER_SITE_OTHER): Payer: Medicare Other | Admitting: Podiatry

## 2018-12-15 DIAGNOSIS — Z09 Encounter for follow-up examination after completed treatment for conditions other than malignant neoplasm: Secondary | ICD-10-CM

## 2018-12-15 DIAGNOSIS — M2011 Hallux valgus (acquired), right foot: Secondary | ICD-10-CM | POA: Diagnosis not present

## 2018-12-15 DIAGNOSIS — M2041 Other hammer toe(s) (acquired), right foot: Secondary | ICD-10-CM

## 2018-12-15 MED ORDER — DOXYCYCLINE HYCLATE 100 MG PO TABS: 100.0000 mg | ORAL_TABLET | Freq: Two times a day (BID) | ORAL | 0 refills | Status: DC

## 2018-12-15 NOTE — Progress Notes (Signed)
Subjective: Darlene [name] is a 68 y.o. is seen today in office s/p right foot bunionectomy with hammertoe repair preformed on.12/09/2018 overall she said the pain is improved.  Time she has pain at max her pain level 7/10 but is not consistent.  She is wearing the cam boot during ice and elevate.  She denies any fevers, chills, nausea, vomiting.  No calf pain, chest pain, shortness of breath.    Objective: General: No acute distress, AAOx3  DP/PT pulses palpable 2/4, CRT < 3 sec to all digits.  Protective sensation intact. Motor function intact.  RIGHT foot: Incision is well coapted without any evidence of dehiscence and sutures are intact as well as K wire to the second digit.  There is mild surrounding erythema to the dorsal aspect on the bunion incision.  There is also ecchymosis present.  There is no ascending cellulitis.  No increase in warmth there is no drainage or pus coming in the incision.  Incisions are well coapted.  No significant tenderness palpation. No other open lesions or pre-ulcerative lesions.  No pain with calf compression, swelling, warmth, erythema.   Assessment and Plan:  Status post right foot surgery with localized erythema  -Treatment options discussed including all alternatives, risks, and complications -X-rays are obtained and reviewed with the patient.  Hardware intact) also, Akin bunionectomy.  Still mild hallux abductus present.  No evidence of acute fracture. -Antibiotic ointment and dressing applied.  Keep the dressing clean, dry, intact.  I do want her to monitor the Ace bandage off to make sure there is no swelling of the knee but redness.  As a precaution with respiratory doxycycline.  I did the erythema is more inflammatory as opposed to infection.  Continue ice elevate. -Pain medication as needed. -Monitor for any clinical signs or symptoms of infection and DVT/PE and directed to call the office immediately should any occur or go to the ER. -Follow-up  as scheduled or sooner if any problems arise. In the meantime, encouraged to call the office with any questions, concerns, change in symptoms.   Celesta Gentile, DPM

## 2018-12-15 NOTE — Patient Instructions (Signed)
Start the new antibiotic as a precaution. Continue to ice/elevate Please let me know if you have any problems

## 2018-12-21 ENCOUNTER — Other Ambulatory Visit: Payer: Self-pay

## 2018-12-21 ENCOUNTER — Ambulatory Visit (HOSPITAL_COMMUNITY)
Admission: RE | Admit: 2018-12-21 | Discharge: 2018-12-21 | Disposition: A | Discharge status: Home / Self Care | Payer: Medicare Other | Source: Ambulatory Visit | Attending: Cardiology | Admitting: Cardiology

## 2018-12-21 ENCOUNTER — Encounter (HOSPITAL_COMMUNITY): Payer: Self-pay | Admitting: Cardiology

## 2018-12-21 ENCOUNTER — Ambulatory Visit (HOSPITAL_BASED_OUTPATIENT_CLINIC_OR_DEPARTMENT_OTHER)
Admission: RE | Admit: 2018-12-21 | Discharge: 2018-12-21 | Disposition: A | Discharge status: Home / Self Care | Payer: Medicare Other | Source: Ambulatory Visit | Attending: Cardiology | Admitting: Cardiology

## 2018-12-21 VITALS — BP 118/73 | HR 70 | Wt 190.0 lb

## 2018-12-21 DIAGNOSIS — Z79899 Other long term (current) drug therapy: Secondary | ICD-10-CM | POA: Insufficient documentation

## 2018-12-21 DIAGNOSIS — G4733 Obstructive sleep apnea (adult) (pediatric): Secondary | ICD-10-CM | POA: Insufficient documentation

## 2018-12-21 DIAGNOSIS — Z171 Estrogen receptor negative status [ER-]: Secondary | ICD-10-CM | POA: Diagnosis not present

## 2018-12-21 DIAGNOSIS — E669 Obesity, unspecified: Secondary | ICD-10-CM | POA: Insufficient documentation

## 2018-12-21 DIAGNOSIS — M19019 Primary osteoarthritis, unspecified shoulder: Secondary | ICD-10-CM | POA: Diagnosis not present

## 2018-12-21 DIAGNOSIS — Z794 Long term (current) use of insulin: Secondary | ICD-10-CM | POA: Diagnosis not present

## 2018-12-21 DIAGNOSIS — C50911 Malignant neoplasm of unspecified site of right female breast: Secondary | ICD-10-CM

## 2018-12-21 DIAGNOSIS — R9431 Abnormal electrocardiogram [ECG] [EKG]: Secondary | ICD-10-CM | POA: Diagnosis not present

## 2018-12-21 DIAGNOSIS — E1140 Type 2 diabetes mellitus with diabetic neuropathy, unspecified: Secondary | ICD-10-CM | POA: Insufficient documentation

## 2018-12-21 DIAGNOSIS — M19049 Primary osteoarthritis, unspecified hand: Secondary | ICD-10-CM | POA: Diagnosis not present

## 2018-12-21 DIAGNOSIS — E11319 Type 2 diabetes mellitus with unspecified diabetic retinopathy without macular edema: Secondary | ICD-10-CM | POA: Insufficient documentation

## 2018-12-21 DIAGNOSIS — G620 Drug-induced polyneuropathy: Secondary | ICD-10-CM | POA: Diagnosis not present

## 2018-12-21 DIAGNOSIS — E785 Hyperlipidemia, unspecified: Secondary | ICD-10-CM | POA: Insufficient documentation

## 2018-12-21 DIAGNOSIS — Z923 Personal history of irradiation: Secondary | ICD-10-CM | POA: Insufficient documentation

## 2018-12-21 DIAGNOSIS — I10 Essential (primary) hypertension: Secondary | ICD-10-CM | POA: Diagnosis not present

## 2018-12-21 DIAGNOSIS — T451X5A Adverse effect of antineoplastic and immunosuppressive drugs, initial encounter: Secondary | ICD-10-CM

## 2018-12-21 DIAGNOSIS — C50411 Malignant neoplasm of upper-outer quadrant of right female breast: Secondary | ICD-10-CM | POA: Insufficient documentation

## 2018-12-21 DIAGNOSIS — F419 Anxiety disorder, unspecified: Secondary | ICD-10-CM | POA: Insufficient documentation

## 2018-12-21 DIAGNOSIS — F329 Major depressive disorder, single episode, unspecified: Secondary | ICD-10-CM | POA: Diagnosis not present

## 2018-12-21 MED ORDER — CARVEDILOL 3.125 MG PO TABS: 3.1250 mg | ORAL_TABLET | Freq: Two times a day (BID) | ORAL | 3 refills | Status: DC

## 2018-12-21 NOTE — Progress Notes (Signed)
  Echocardiogram 2D Echocardiogram has been performed.  Darlene [name] 12/21/2018, 2:50 PM

## 2018-12-21 NOTE — Patient Instructions (Signed)
Start Carvedilol 3.125 mg Twice daily   Your physician recommends that you schedule a follow-up appointment in: 2 months with echocardiogram

## 2018-12-22 ENCOUNTER — Ambulatory Visit (INDEPENDENT_AMBULATORY_CARE_PROVIDER_SITE_OTHER): Payer: Medicare Other | Admitting: Podiatry

## 2018-12-22 DIAGNOSIS — M2011 Hallux valgus (acquired), right foot: Secondary | ICD-10-CM

## 2018-12-22 DIAGNOSIS — Z09 Encounter for follow-up examination after completed treatment for conditions other than malignant neoplasm: Secondary | ICD-10-CM

## 2018-12-22 DIAGNOSIS — M2041 Other hammer toe(s) (acquired), right foot: Secondary | ICD-10-CM

## 2018-12-22 NOTE — Progress Notes (Signed)
Patient ID: Darlene [name], female   DOB: August 24, 1950, 68 y.o.   MRN: [id number] Oncologist: Dr Jana Hakim   HPI: Darlene [name] is a 68 y.o.with history of R breast cancer, right axillary lymph node biopsy 07/19/2014 for a cT2 pN1, stage IIB invasive ductal carcinoma, grade 2, estrogen and progesterone receptor negative, with an MIB-1 of 90%, and HER-2 positive , OSA -> CPAP, bariatric surgery, and DM, referred to cardio-oncology clinic by Dr Jana Hakim  Completed neoadjuvant chemotherapy with carboplatin, docetaxel, trastuzumab and pertuzumab given every 3 weeks 6. Trastuzumab + capecitabine were completed in 9/17.  She had lumpectomy 12/16 and had mastectomy in 1/17.  She has completed radiation.   She was found in 11/17 to have a recurrence by PET => right retropectoral lymph node enlarged. In 2/18, she started on treatment with ado-trastuzumab emtansine, now back on Herceptin.  Echo prior to treatment showed stable EF.  Starting in 6/19, she was transitioned to [facility].   She is now off Kadcyla and back on Herceptin/Perjeta for use long-term.     ECG (personally reviewed): NSR, 1st degree AVB, poor RWP  Echo (7/16) with EF 60-65%, grade II diastolic dysfunction.  Echo (10/16) with EF 66%, GLS -17%. Echo (1/17) with EF 60-65%, grade II diastolic dysfunction, lateral s' 10, GLS -18.8% Echo (4/17) with EF 60-65%, grade II diastolic dysfunction, lateral s' 9.5, strain poorly traced and off axis, normal RV size and systolic function.  Echo (6/17) with EF 55-60%, aortic sclerosis without stenosis, difficult images for strain, probably not accurate.  Echo (10/17) with EF 55-60%, mild LVH, moderate diastolic dysfunction, normal RV size and systolic function.  Echo (2/18) with EF 55-60%, GLS -15% but technically difficult study Echo (6/18) with EF 60-65%, GLS -15% but technically difficult study.  Echo (9/18) with EF 55-60%, GLS -19.8%, grade II diastolic dysfunction.  Echo (1/19) with EF 55-60%,  normal RV size and systolic function, GLS -93.2% (done with different machine).  Echo (4/19) with EF 55-60%, mild LVH, GLS -17% Echo (9/19) with EF 55-60%, GLS -17.1%, normal RV size and systolic function.  Echo (8/20): EF 50-55%.  Echo (11/20): EF 50-55%, GLS -12.7% (not ideal tracing), mildly decreased RV systolic function.   Review of Systems: All systems reviewed and negative except as per HPI.   Past Medical History:  Diagnosis Date  . Anemia    hx of - during 1st round chemo  . Anxiety   . Arthritis    in fingers, shoulders  . Back pain   . Balance problem   . Breast cancer ([place])   . Breast cancer of upper-outer quadrant of right female breast ([place]) 07/22/2014  . Bunion, right foot   . Clumsiness   . Constipation   . Depression   . Diabetes mellitus without complication ([place])    35+ years  . Diabetic retinopathy ([place])    torn retina  . Eating disorder    binge eating  . Epistaxis 08/26/2014  . Fatty liver   . HCAP (healthcare-associated pneumonia) 12/18/2014  . Headache   . Heart murmur    never had any problems  . History of radiation therapy 04/05/15-05/19/15   right chest wall was treated to 50.4 Gy in 28 fractions, right mastectomy scar was treated to 10 Gy in 5 fractions  . Hyperlipidemia   . Hypertension   . Joint pain   . Multiple food allergies   . Neuromuscular disorder ([place])    diabetic neuropathy  . Obesity   .  Obstructive sleep apnea on CPAP    uses cpap nightly  . Ovarian cyst rupture    possible  . Pneumonia   . Pneumonia 11/2014  . Radiation 04/05/15-05/19/15   right chest wall 50.4 Gy, mastectomy scar 10 Gy  . Sleep apnea   . Thyroid nodule 06/2014    Current Outpatient Medications  Medication Sig Dispense Refill  . acetaminophen (TYLENOL) 325 MG tablet Take 650 mg by mouth 2 (two) times daily as needed for moderate pain or headache.     . Blood Glucose Monitoring Suppl (ONETOUCH VERIO FLEX SYSTEM) w/Device KIT 1 kit by Does not apply route 4  (four) times daily. Test blood sugars four times daily 1 kit 0  . buPROPion (WELLBUTRIN SR) 200 MG 12 hr tablet TAKE 1 TABLET BY MOUTH DAILY AT 12 NOON. 30 tablet 1  . CALCIUM CITRATE-VITAMIN D PO Take 1 tablet by mouth 3 (three) times daily.     . cephALEXin (KEFLEX) 500 MG capsule Take 1 capsule (500 mg total) by mouth 2 (two) times daily. 14 capsule 0  . cholecalciferol (VITAMIN D) 1000 units tablet Take 1,000 Units by mouth daily.    . cholestyramine (QUESTRAN) 4 g packet Take 1 packet (4 g total) by mouth 2 (two) times daily as needed. 10 each 12  . Cyanocobalamin (VITAMIN B-12 PO) Take 1 tablet by mouth every 14 (fourteen) days.    . diphenhydrAMINE (BENADRYL) 25 MG tablet Take 25 mg by mouth daily as needed for allergies or sleep.     [name] doxycycline (VIBRA-TABS) 100 MG tablet Take 1 tablet (100 mg total) by mouth 2 (two) times daily. 20 tablet 0  . DUREZOL 0.05 % EMUL     . glucose blood (ONETOUCH VERIO) test strip Use as instructed 100 each 1  . Insulin Glargine (LANTUS SOLOSTAR) 100 UNIT/ML Solostar Pen Inject 12 Units into the skin daily. 5 pen 1  . liraglutide (VICTOZA) 18 MG/3ML SOPN INJECT 1.8MG(0.3 MLS) INTO THE SKIN EVERY MORNING 3 pen 1  . loratadine (CLARITIN) 10 MG tablet Take 10 mg by mouth as needed for allergies.    [name] losartan (COZAAR) 100 MG tablet Take 1 tablet (100 mg total) by mouth daily. 30 tablet 1  . metFORMIN (GLUCOPHAGE) 500 MG tablet Take 1 tablet (500 mg total) by mouth daily. 30 tablet 1  . ofloxacin (OCUFLOX) 0.3 % ophthalmic solution Place 1 drop into both eyes as needed.     Glory Rosebush Delica Lancets 02I MISC 1 Package by Does not apply route daily. 100 each 0  . oxyCODONE-acetaminophen (PERCOCET/ROXICET) 5-325 MG tablet Take 1-2 tablets by mouth every 6 (six) hours as needed for severe pain. 30 tablet 0  . Pediatric Multivitamins-Iron (FLINTSTONES PLUS IRON) chewable tablet Chew 1 tablet by mouth 2 (two) times daily.    . Probiotic Product ([employer] PROBIOTIC PO)  Take 1 capsule by mouth daily.     . prochlorperazine (COMPAZINE) 10 MG tablet Take 1 tablet (10 mg total) by mouth every 6 (six) hours as needed for nausea or vomiting. 30 tablet 0  . Propylene Glycol (SYSTANE BALANCE OP) Place 1-2 drops into both eyes 3 (three) times daily as needed (for dry eyes).     [name] senna-docusate (SENNA PLUS) 8.6-50 MG tablet Take 1 tablet by mouth daily as needed for moderate constipation.     . SURE COMFORT PEN NEEDLES 32G X 4 MM MISC USE WITH INJECTIONS 100 each 0  . tobramycin (TOBREX) 0.3 % ophthalmic  solution     . topiramate (TOPAMAX) 25 MG tablet Take 1 tablet (25 mg total) by mouth daily. 30 tablet 1  . vortioxetine HBr (TRINTELLIX) 20 MG TABS tablet Take 1 tablet (20 mg total) by mouth at bedtime. 30 tablet 1  . carvedilol (COREG) 3.125 MG tablet Take 1 tablet (3.125 mg total) by mouth 2 (two) times daily. 60 tablet 3   No current facility-administered medications for this encounter.     Allergies  Allergen Reactions  . Nsaids Other (See Comments)    H/o gastric bypass - avoid NSAIDs  . Tape Hives, Rash and Other (See Comments)    Adhesives in bandaids      Social History   Socioeconomic History  . Marital status: Married    Spouse name: [name]  . Number of children: 0  . Years of education: Bachelor's  . Highest education level: Not on file  Occupational History  . Occupation: Pensions consultant: [employer]  . Financial resource strain: Not on file  . Food insecurity    Worry: Not on file    Inability: Not on file  . Transportation needs    Medical: Not on file    Non-medical: Not on file  Tobacco Use  . Smoking status: Never Smoker  . Smokeless tobacco: Never Used  Substance and Sexual Activity  . Alcohol use: Yes    Alcohol/week: 1.0 - 2.0 standard drinks    Types: 1 - 2 Standard drinks or equivalent per week    Comment: social  . Drug use: No  . Sexual activity: Yes    Partners: Male     Birth control/protection: Post-menopausal, Other-see comments    Comment: husband vasectomy  Lifestyle  . Physical activity    Days per week: Not on file    Minutes per session: Not on file  . Stress: Not on file  Relationships  . Social Herbalist on phone: Not on file    Gets together: Not on file    Attends religious service: Not on file    Active member of club or organization: Not on file    Attends meetings of clubs or organizations: Not on file    Relationship status: Not on file  . Intimate partner violence    Fear of current or ex partner: Not on file    Emotionally abused: Not on file    Physically abused: Not on file    Forced sexual activity: Not on file  Other Topics Concern  . Not on file  Social History Narrative   Patient is married [name]) and lives at home with her husband.   Patient does not have any children.   Patient is working for a [employer].   Patient has a Bachelor's degree.   Patient is right-handed.   Patient does not drink any caffeine.      Family History  Problem Relation Age of Onset  . CVA Mother   . Heart disease Mother   . Sudden death Mother   . Diabetes Father   . Heart failure Father   . Hypertension Father   . Kidney disease Father   . Obesity Father   . Hypertension Sister   . Diabetes Brother   . Breast cancer Paternal Grandmother 1  . Diabetes Paternal Uncle   . Ovarian cancer Neg Hx     Vitals:   12/21/18 1515  BP: 118/73  Pulse: 70  SpO2: 100%  Weight: 86.2 kg (190 lb)    PHYSICAL EXAM: General: NAD Neck: No JVD, no thyromegaly or thyroid nodule.  Lungs: Clear to auscultation bilaterally with normal respiratory effort. CV: Nondisplaced PMI.  Heart regular S1/S2, no S3/S4, no murmur.  No peripheral edema.  No carotid bruit.  Normal pedal pulses.  Abdomen: Soft, nontender, no hepatosplenomegaly, no distention.  Skin: Intact without lesions or rashes.  Neurologic: Alert and oriented x 3.   Psych: Normal affect. Extremities: No clubbing or cyanosis.  HEENT: Normal.   ASSESSMENT & PLAN:  1. Breast cancer: Metastatic.  Currently getting Herceptin and Perjeta. I reviewed today's echo, EF is mildly reduced at 50-55%, global longitudinal strain is abnormal but strain did not track the endocardium well.  Last echo in 8/20 showed EF 50-55%.  She has been receiving Herceptin or a Herceptin-like medication for several years now.  Hopefully, we are not seeing the development of cardio-toxicity.  - Continue losartan 100 mg daily.  - Add Coreg 3.125 mg bid.  - I will have her get a repeat echo in 2 months rather than waiting 6 months.   2. HTN: BP controlled on losartan for now.  3. OSA: Continue nightly CPAP   Loralie Champagne 12/22/2018

## 2018-12-25 ENCOUNTER — Encounter (HOSPITAL_COMMUNITY): Payer: Self-pay

## 2018-12-29 ENCOUNTER — Other Ambulatory Visit: Payer: Self-pay

## 2018-12-29 ENCOUNTER — Other Ambulatory Visit: Payer: Self-pay | Admitting: Oncology

## 2018-12-29 ENCOUNTER — Inpatient Hospital Stay: Payer: Medicare Other

## 2018-12-29 ENCOUNTER — Inpatient Hospital Stay: Payer: Medicare Other | Attending: Oncology

## 2018-12-29 VITALS — BP 117/57 | HR 64 | Temp 97.9°F | Resp 18

## 2018-12-29 DIAGNOSIS — C50411 Malignant neoplasm of upper-outer quadrant of right female breast: Secondary | ICD-10-CM | POA: Diagnosis present

## 2018-12-29 DIAGNOSIS — Z171 Estrogen receptor negative status [ER-]: Secondary | ICD-10-CM | POA: Insufficient documentation

## 2018-12-29 DIAGNOSIS — Z5112 Encounter for antineoplastic immunotherapy: Secondary | ICD-10-CM | POA: Diagnosis present

## 2018-12-29 DIAGNOSIS — C801 Malignant (primary) neoplasm, unspecified: Secondary | ICD-10-CM

## 2018-12-29 DIAGNOSIS — Z95828 Presence of other vascular implants and grafts: Secondary | ICD-10-CM

## 2018-12-29 DIAGNOSIS — C787 Secondary malignant neoplasm of liver and intrahepatic bile duct: Secondary | ICD-10-CM

## 2018-12-29 DIAGNOSIS — C50911 Malignant neoplasm of unspecified site of right female breast: Secondary | ICD-10-CM

## 2018-12-29 LAB — CBC WITH DIFFERENTIAL/PLATELET
Abs Immature Granulocytes: 0.03 10*3/uL (ref 0.00–0.07)
Basophils Absolute: 0 10*3/uL (ref 0.0–0.1)
Basophils Relative: 1 %
Eosinophils Absolute: 0.2 10*3/uL (ref 0.0–0.5)
Eosinophils Relative: 3 %
HCT: 32.2 % — ABNORMAL LOW (ref 36.0–46.0)
Hemoglobin: 10.9 g/dL — ABNORMAL LOW (ref 12.0–15.0)
Immature Granulocytes: 1 %
Lymphocytes Relative: 19 %
Lymphs Abs: 1.1 10*3/uL (ref 0.7–4.0)
MCH: 31.8 pg (ref 26.0–34.0)
MCHC: 33.9 g/dL (ref 30.0–36.0)
MCV: 93.9 fL (ref 80.0–100.0)
Monocytes Absolute: 0.5 10*3/uL (ref 0.1–1.0)
Monocytes Relative: 9 %
Neutro Abs: 3.9 10*3/uL (ref 1.7–7.7)
Neutrophils Relative %: 67 %
Platelets: 202 10*3/uL (ref 150–400)
RBC: 3.43 MIL/uL — ABNORMAL LOW (ref 3.87–5.11)
RDW: 13 % (ref 11.5–15.5)
WBC: 5.7 10*3/uL (ref 4.0–10.5)
nRBC: 0 % (ref 0.0–0.2)

## 2018-12-29 LAB — COMPREHENSIVE METABOLIC PANEL
ALT: 43 U/L (ref 0–44)
AST: 38 U/L (ref 15–41)
Albumin: 3.8 g/dL (ref 3.5–5.0)
Alkaline Phosphatase: 105 U/L (ref 38–126)
Anion gap: 9 (ref 5–15)
BUN: 39 mg/dL — ABNORMAL HIGH (ref 8–23)
CO2: 21 mmol/L — ABNORMAL LOW (ref 22–32)
Calcium: 9 mg/dL (ref 8.9–10.3)
Chloride: 112 mmol/L — ABNORMAL HIGH (ref 98–111)
Creatinine, Ser: 1.6 mg/dL — ABNORMAL HIGH (ref 0.44–1.00)
GFR calc Af Amer: 39 mL/min — ABNORMAL LOW (ref >60)
GFR calc non Af Amer: 33 mL/min — ABNORMAL LOW (ref >60)
Glucose, Bld: 118 mg/dL — ABNORMAL HIGH (ref 70–99)
Potassium: 4.4 mmol/L (ref 3.5–5.1)
Sodium: 142 mmol/L (ref 135–145)
Total Bilirubin: 0.5 mg/dL (ref 0.3–1.2)
Total Protein: 6.4 g/dL — ABNORMAL LOW (ref 6.5–8.1)

## 2018-12-29 LAB — CEA (IN HOUSE-CHCC): CEA (CHCC-In House): 1.05 ng/mL (ref 0.00–5.00)

## 2018-12-29 MED ORDER — HEPARIN SOD (PORK) LOCK FLUSH 100 UNIT/ML IV SOLN
500.0000 [IU] | Freq: Once | INTRAVENOUS | Status: AC | PRN
  Administered 2018-12-29: 500 [IU]
  Filled 2018-12-29: qty 5

## 2018-12-29 MED ORDER — TRASTUZUMAB-ANNS CHEMO 420 MG IV SOLR
6.0000 mg/kg | Freq: Once | INTRAVENOUS | Status: AC
  Administered 2018-12-29: 546 mg via INTRAVENOUS
  Filled 2018-12-29: qty 26

## 2018-12-29 MED ORDER — SODIUM CHLORIDE 0.9 % IV SOLN
Freq: Once | INTRAVENOUS | Status: AC
  Administered 2018-12-29: 15:00:00 via INTRAVENOUS
  Filled 2018-12-29: qty 250

## 2018-12-29 MED ORDER — DIPHENHYDRAMINE HCL 25 MG PO CAPS
25.0000 mg | ORAL_CAPSULE | Freq: Once | ORAL | Status: AC
  Administered 2018-12-29: 25 mg via ORAL

## 2018-12-29 MED ORDER — SODIUM CHLORIDE 0.9% FLUSH
10.0000 mL | INTRAVENOUS | Status: DC | PRN
  Administered 2018-12-29: 10 mL via INTRAVENOUS
  Filled 2018-12-29: qty 10

## 2018-12-29 MED ORDER — SODIUM CHLORIDE 0.9 % IV SOLN
420.0000 mg | Freq: Once | INTRAVENOUS | Status: AC
  Administered 2018-12-29: 420 mg via INTRAVENOUS
  Filled 2018-12-29: qty 14

## 2018-12-29 MED ORDER — ACETAMINOPHEN 325 MG PO TABS
ORAL_TABLET | ORAL | Status: AC
  Filled 2018-12-29: qty 2

## 2018-12-29 MED ORDER — SODIUM CHLORIDE 0.9% FLUSH
10.0000 mL | INTRAVENOUS | Status: DC | PRN
  Administered 2018-12-29: 10 mL
  Filled 2018-12-29: qty 10

## 2018-12-29 MED ORDER — DIPHENHYDRAMINE HCL 25 MG PO CAPS
ORAL_CAPSULE | ORAL | Status: AC
  Filled 2018-12-29: qty 1

## 2018-12-29 MED ORDER — ACETAMINOPHEN 325 MG PO TABS
650.0000 mg | ORAL_TABLET | Freq: Once | ORAL | Status: AC
  Administered 2018-12-29: 650 mg via ORAL

## 2018-12-29 NOTE — Progress Notes (Signed)
Dr. Jana Hakim has reviewed cardiology plan - continue trastuzumab/pertuzumab w/ close cardiac monitoring per cards.   Demetrius Charity, PharmD, [facility] Pharmacist Pharmacy Phone: [phone] 12/29/2018

## 2018-12-29 NOTE — Patient Instructions (Signed)
[facility] Discharge Instructions for Patients Receiving Chemotherapy  Today you received the following chemotherapy agents Trastuzumab and Perjeta  To help prevent nausea and vomiting after your treatment, we encourage you to take your nausea medication as directed.    If you develop nausea and vomiting that is not controlled by your nausea medication, call the clinic.   BELOW ARE SYMPTOMS THAT SHOULD BE REPORTED IMMEDIATELY:  *FEVER GREATER THAN 100.5 F  *CHILLS WITH OR WITHOUT FEVER  NAUSEA AND VOMITING THAT IS NOT CONTROLLED WITH YOUR NAUSEA MEDICATION  *UNUSUAL SHORTNESS OF BREATH  *UNUSUAL BRUISING OR BLEEDING  TENDERNESS IN MOUTH AND THROAT WITH OR WITHOUT PRESENCE OF ULCERS  *URINARY PROBLEMS  *BOWEL PROBLEMS  UNUSUAL RASH Items with * indicate a potential emergency and should be followed up as soon as possible.  Feel free to call the clinic should you have any questions or concerns. The clinic phone number is [phone].  Please show the CHEMO ALERT CARD at check-in to the Emergency Department and triage nurse.   

## 2018-12-30 NOTE — Progress Notes (Signed)
Subjective: Darlene [name] is a 68 y.o. is seen today in office s/p right foot bunionectomy with hammertoe repair preformed on 12/09/2018.  She states that her pain is controlled and is improving.  She still taking antibiotics. She denies any fevers, chills, nausea, vomiting.  No calf pain, chest pain, shortness of breath.    Objective: General: No acute distress, AAOx3  DP/PT pulses palpable 2/4, CRT < 3 sec to all digits.  Protective sensation intact. Motor function intact.  RIGHT foot: Incision is well coapted without any evidence of dehiscence and sutures are intact as well as K wire to the second digit.  Erythema is much improved.  No drainage or pus from the incision site there is no ascending cellulitis.  There is no fluctuation potation.  There is no malodor.  Hallux abductus is still mildly present unfortunately. No other open lesions or pre-ulcerative lesions.  No pain with calf compression, swelling, warmth, erythema.   Assessment and Plan:  Status post right foot surgery with localized erythema with improvement  -Treatment options discussed including all alternatives, risks, and complications -Overall the erythema is improving there for much better today.  Antibiotic ointment was applied followed by dressing.  Keep dressing clean, dry, intact.  Finish course of antibiotics.  Encouraged ice and elevation.  Remain in cam boot.  Weight-bear as tolerated but minimal. -Monitor for any clinical signs or symptoms of infection and directed to call the office immediately should any occur or go to the ER.  Return in about 1 week (around 12/29/2018).  Trula Slade DPM

## 2018-12-31 ENCOUNTER — Other Ambulatory Visit: Payer: Self-pay

## 2018-12-31 ENCOUNTER — Encounter: Payer: Self-pay | Admitting: Podiatry

## 2018-12-31 ENCOUNTER — Ambulatory Visit (INDEPENDENT_AMBULATORY_CARE_PROVIDER_SITE_OTHER): Payer: Medicare Other | Admitting: Podiatry

## 2018-12-31 DIAGNOSIS — Z09 Encounter for follow-up examination after completed treatment for conditions other than malignant neoplasm: Secondary | ICD-10-CM

## 2018-12-31 DIAGNOSIS — M2011 Hallux valgus (acquired), right foot: Secondary | ICD-10-CM

## 2018-12-31 DIAGNOSIS — M2041 Other hammer toe(s) (acquired), right foot: Secondary | ICD-10-CM

## 2019-01-11 ENCOUNTER — Other Ambulatory Visit: Payer: Self-pay

## 2019-01-11 ENCOUNTER — Ambulatory Visit (INDEPENDENT_AMBULATORY_CARE_PROVIDER_SITE_OTHER): Payer: Medicare Other | Admitting: Podiatry

## 2019-01-11 ENCOUNTER — Ambulatory Visit (INDEPENDENT_AMBULATORY_CARE_PROVIDER_SITE_OTHER): Payer: Medicare Other

## 2019-01-11 DIAGNOSIS — M2041 Other hammer toe(s) (acquired), right foot: Secondary | ICD-10-CM

## 2019-01-11 DIAGNOSIS — Z09 Encounter for follow-up examination after completed treatment for conditions other than malignant neoplasm: Secondary | ICD-10-CM

## 2019-01-11 DIAGNOSIS — M2011 Hallux valgus (acquired), right foot: Secondary | ICD-10-CM

## 2019-01-11 NOTE — Progress Notes (Signed)
Subjective: Darlene [name] is a 68 y.o. is seen today in office s/p right foot bunionectomy with hammertoe repair preformed on 12/09/2018.  She states that she is doing well with minimal discomfort.  She has no concerns today.  She is scheduled for an MRI later this month and she needs to have the K wire removed prior to this if able.  She currently denies any fevers, chills, nausea, vomiting.  No calf pain, chest pain, shortness of breath.  No other concerns today.    Objective: General: No acute distress, AAOx3  DP/PT pulses palpable 2/4, CRT < 3 sec to all digits.  Protective sensation intact. Motor function intact.  RIGHT foot: Incision is well coapted without any evidence of dehiscence and sutures are intact as well as K wire to the second digit.  Erythema has resolved.  Hallux abductus is still present unfortunately but there is no pain.  Second toe is doing a rectus position with K wire intact.   No other open lesions or pre-ulcerative lesions.  No pain with calf compression, swelling, warmth, erythema.   Assessment and Plan:  Status post right foot surgery with resolved erythema  -Treatment options discussed including all alternatives, risks, and complications -Erythema has resolved.  Antibiotic ointment and dressing applied.  Hallux was splinted into a rectus position.  Continue cam boot, weight-bear as tolerated.  Continue ice and elevation. -Monitor for any clinical signs or symptoms of infection and directed to call the office immediately should any occur or go to the ER.  Follow-up as scheduled for repeat x-rays and likely K-wire removal.  Trula Slade DPM

## 2019-01-12 ENCOUNTER — Other Ambulatory Visit (INDEPENDENT_AMBULATORY_CARE_PROVIDER_SITE_OTHER): Payer: Self-pay | Admitting: Family Medicine

## 2019-01-12 DIAGNOSIS — Z794 Long term (current) use of insulin: Secondary | ICD-10-CM

## 2019-01-12 DIAGNOSIS — E1140 Type 2 diabetes mellitus with diabetic neuropathy, unspecified: Secondary | ICD-10-CM

## 2019-01-13 ENCOUNTER — Other Ambulatory Visit: Payer: Self-pay | Admitting: Oncology

## 2019-01-13 ENCOUNTER — Ambulatory Visit (HOSPITAL_COMMUNITY)
Admission: RE | Admit: 2019-01-13 | Discharge: 2019-01-13 | Disposition: A | Discharge status: Home / Self Care | Payer: Medicare Other | Source: Ambulatory Visit | Attending: Adult Health | Admitting: Adult Health

## 2019-01-13 ENCOUNTER — Other Ambulatory Visit: Payer: Self-pay

## 2019-01-13 DIAGNOSIS — C787 Secondary malignant neoplasm of liver and intrahepatic bile duct: Secondary | ICD-10-CM | POA: Diagnosis present

## 2019-01-13 DIAGNOSIS — C50411 Malignant neoplasm of upper-outer quadrant of right female breast: Secondary | ICD-10-CM

## 2019-01-13 MED ORDER — GADOBUTROL 1 MMOL/ML IV SOLN
9.0000 mL | Freq: Once | INTRAVENOUS | Status: AC | PRN
  Administered 2019-01-13: 9 mL via INTRAVENOUS

## 2019-01-13 NOTE — Progress Notes (Signed)
Called [facility] and gave her the good news on her MRI

## 2019-01-17 ENCOUNTER — Encounter: Payer: Self-pay | Admitting: Podiatry

## 2019-01-18 NOTE — Progress Notes (Signed)
Subjective: Darlene [name] is a 68 y.o. is seen today in office s/p right foot bunionectomy with hammertoe repair preformed on 12/09/2018.  She presents today for K wire removal.  She said that she is doing well she not having significant discomfort.  No recent injury.  She still been wearing a surgical boot. Denies any fevers, chills, nausea, vomiting.  No calf pain, chest pain, shortness of breath.  No other concerns today.    Objective: General: No acute distress, AAOx3  DP/PT pulses palpable 2/4, CRT < 3 sec to all digits.  Protective sensation intact. Motor function intact.  RIGHT foot: Incision is well coapted without any evidence of dehiscence and sutures are intact as well as K wire to the second digit.  There is minimal edema.  There is no erythema or warmth.  No drainage or pus coming from the incisions with a K wire site.  No clinical signs of infection.  Unfortunately hallux abductus is still present.  There is no pain associated with this. No other open lesions or pre-ulcerative lesions.  No pain with calf compression, swelling, warmth, erythema.   Assessment and Plan:  Status post right foot surgery   -Treatment options discussed including all alternatives, risks, and complications -X-rays obtained reviewed.  Hallux adductus is still present but hardware is intact.  No evidence of acute fracture. -K wires removed today after the K wire was cleaned.  Toe remained in rectus position. -Discussed range of motion exercises.  Also dispensed a Darco bunion splint to help open toe over in a rectus position. -Continue ice elevate. -Hallux abductus still present.  If this becomes symptomatic in the future likely will need MPJ arthrodesis. -Monitor for any clinical signs or symptoms of infection and directed to call the office immediately should any occur or go to the ER.  Trula Slade DPM

## 2019-01-18 NOTE — Progress Notes (Signed)
[place]  [phone] [phone]    ID: SANSKRITI GREENLAW DOB: May 05, 1950  MR#: [id number]  TIR#:[id number]  Patient Care Team: Starlyn Skeans, MD as PCP - General (Family Medicine) Stark Klein, MD as Consulting Physician (General Surgery) Phala Schraeder, Virgie Dad, MD as Consulting Physician (Oncology) Mauro Kaufmann, RN as Registered Nurse Rockwell Germany, RN as Registered Nurse Jacelyn Pi, MD as Consulting Physician (Endocrinology) [name], MD as Consulting Physician (Plastic Surgery) Larey Dresser, MD as Consulting Physician (Cardiology) Gery Pray, MD as Consulting Physician (Radiation Oncology) Juanita Craver, MD as Consulting Physician (Gastroenterology) Dohmeier, Asencion Partridge, MD as Consulting Physician (Neurology)  OTHER MD:   CHIEF COMPLAINT:  HER-2 positive, estrogen receptor negative locally recurrent disease  CURRENT TREATMENT:  Trastuzumab; Pertuzumab   INTERVAL HISTORY:  [name] returns today for follow-up and treatment of her estrogen receptor negative but HER-2 amplified breast cancer.  Unfortunately her husband [name] who loves to be here for these visits was not able to be present  Janiece continues on Trastuzumab/Pertuzumab every 3 weeks.  She has diarrhea about once a day based on this.  Of course many people with diabetes also have problems with stools.  In any case she takes 2 Imodium after the diarrheal bowel movement and that takes care of it.  She does not like to take the Questran because of taste issues.  Since her last visit, she underwent repeat echocardiogram on 12/21/2018, which showed an ejection fraction of 50-55%.  She is concerned that this is "drifting down".  She has discussed this with cardiology.  She also underwent restaging liver MRI on 01/13/2019, which showed: stable appearance of treated metastatic lesion in dome of right hepatic lobe; no new or progressive findings.  She also had right  hammertoe surgery and eye surgery since the last visit here.   REVIEW OF SYSTEMS: [name] has been losing weight on purpose and is now at a place where she can "stabilize".  She is not exercising regularly.  Even though she does have diarrhea at least daily she tells me her bottom is not real.  She has had no unusual headaches, nausea or vomiting.  A detailed review of systems today was otherwise stable   BREAST CANCER HISTORY: From the original intake note:  "[name]" had screening mammography at her gynecologist suggestive of a possible abnormality in the right breast. However right diagnostic mammogram 04/12/2013 at [facility] was negative. More recently however, she noted a lump in her right breast and brought it to her gynecologist's attention. On 07/18/2014 the patient had bilateral diagnostic mammography with tomosynthesis at [facility]. The breast density was category B. In the right breast upper outer quadrant there was an area of focal asymmetry with indistinct margins.[name] Ultrasound was obtained and showed in addition to the mass in question, measuring 1.7 cm, an abnormal-appearing lymph node in the right axillary tail.  On 07/19/2014 the patient underwent biopsy of the right breast massin question as well as a right axillary lymph node. Both were positive for invasive ductal carcinoma, grade 2, estrogen and progesterone receptor negative, with an MIB-1 of 90%, and HER-2 pending. Incidentally the lymph node biopsy showed a lymphocytic inflammatory component but no lymph node architecture.  Her subsequent history is as detailed above.   PAST MEDICAL HISTORY: Past Medical History:  Diagnosis Date  . Anemia    hx of - during 1st round chemo  . Anxiety   . Arthritis    in fingers, shoulders  .  Back pain   . Balance problem   . Breast cancer ([place])   . Breast cancer of upper-outer quadrant of right female breast ([name]) 07/22/2014  . Bunion, right foot   . Clumsiness   . Constipation   . Depression     . Diabetes mellitus without complication ([place])    23+ years  . Diabetic retinopathy ([place])    torn retina  . Eating disorder    binge eating  . Epistaxis 08/26/2014  . Fatty liver   . HCAP (healthcare-associated pneumonia) 12/18/2014  . Headache   . Heart murmur    never had any problems  . History of radiation therapy 04/05/15-05/19/15   right chest wall was treated to 50.4 Gy in 28 fractions, right mastectomy scar was treated to 10 Gy in 5 fractions  . Hyperlipidemia   . Hypertension   . Joint pain   . Multiple food allergies   . Neuromuscular disorder ([place])    diabetic neuropathy  . Obesity   . Obstructive sleep apnea on CPAP    uses cpap nightly  . Ovarian cyst rupture    possible  . Pneumonia   . Pneumonia 11/2014  . Radiation 04/05/15-05/19/15   right chest wall 50.4 Gy, mastectomy scar 10 Gy  . Sleep apnea   . Thyroid nodule 06/2014    PAST SURGICAL HISTORY: Past Surgical History:  Procedure Laterality Date  . APPENDECTOMY    . BIOPSY THYROID Bilateral 06/2014   2 nodules - Benign   . BREAST LUMPECTOMY WITH RADIOACTIVE SEED AND SENTINEL LYMPH NODE BIOPSY Right 12/27/2014   Procedure: BREAST LUMPECTOMY WITH RADIOACTIVE SEED AND SENTINEL LYMPH NODE BIOPSY;  Surgeon: Stark Klein, MD;  Location: [place];  Service: General;  Laterality: Right;  . BREAST REDUCTION SURGERY Bilateral 01/06/2015   Procedure: BILATERAL BREAST REDUCTION, RIGHT ONCOPLASTIC RECONSTRUCTION),LEFT BREAST REDUCTION FOR SYMMETRY;  Surgeon: [name], MD;  Location: [place];  Service: Plastics;  Laterality: Bilateral;  . BREATH TEK H PYLORI N/A 09/15/2012   Procedure: BREATH TEK H PYLORI;  Surgeon: Pedro Earls, MD;  Location: [facility];  Service: General;  Laterality: N/A;  . BUNIONECTOMY Left 12/2013  . CARPAL TUNNEL RELEASE Right   . CARPAL TUNNEL RELEASE Left   . CATARACT EXTRACTION     6/19  . COLONOSCOPY    . DUPUYTREN CONTRACTURE RELEASE    . GASTRIC ROUX-EN-Y N/A  11/02/2012   Procedure: LAPAROSCOPIC ROUX-EN-Y GASTRIC;  Surgeon: Pedro Earls, MD;  Location: [facility];  Service: General;  Laterality: N/A;  . IR GENERIC HISTORICAL  03/18/2016   IR US GUIDE VASC ACCESS LEFT 03/18/2016 Markus Daft, MD [facility]  . IR GENERIC HISTORICAL  03/18/2016   IR FLUORO GUIDE CV LINE LEFT 03/18/2016 Markus Daft, MD [facility]  . LAPAROSCOPIC APPENDECTOMY N/A 07/22/2015   Procedure: APPENDECTOMY LAPAROSCOPIC;  Surgeon: Michael Boston, MD;  Location: [facility];  Service: General;  Laterality: N/A;  . MASTECTOMY Right 02/15/2015   modified  . MODIFIED MASTECTOMY Right 02/15/2015   Procedure: RIGHT MODIFIED RADICAL MASTECTOMY;  Surgeon: Stark Klein, MD;  Location: [place];  Service: General;  Laterality: Right;  . PORT-A-CATH REMOVAL Left 10/10/2015   Procedure: PORT REMOVAL;  Surgeon: Stark Klein, MD;  Location: [place];  Service: General;  Laterality: Left;  . PORTACATH PLACEMENT Left 08/02/2014   Procedure: INSERTION PORT-A-CATH;  Surgeon: Stark Klein, MD;  Location: [place];  Service: General;  Laterality: Left;  . PORTACATH PLACEMENT N/A  11/05/2016   Procedure: INSERTION PORT-A-CATH;  Surgeon: Stark Klein, MD;  Location: [place];  Service: General;  Laterality: N/A;  . RETINAL LASER PROCEDURE     retina tear on left eye  . ROTATOR CUFF REPAIR Right 2005  . SCAR REVISION Right 12/08/2015   Procedure: COMPLEX REPAIR RIGHT CHEST 10-15CM;  Surgeon: [name], MD;  Location: [place];  Service: Plastics;  Laterality: Right;  . TOE SURGERY  1970s   bone spur  . TRIGGER FINGER RELEASE     x3    FAMILY HISTORY Family History  Problem Relation Age of Onset  . CVA Mother   . Heart disease Mother   . Sudden death Mother   . Diabetes Father   . Heart failure Father   . Hypertension Father   . Kidney disease Father   . Obesity Father   . Hypertension Sister   . Diabetes Brother   . Breast cancer Paternal Grandmother 41  .  Diabetes Paternal Uncle   . Ovarian cancer Neg Hx    The patient's father died from complications of diabetes at age 22. The patient's mother died at age 80 with a subarachnoid hemorrhage. The patient had one brother, one sister. The only breast cancer was the patient's paternal grandmother diagnosed in her 28s. There is no history of ovarian cancer in the family.   GYNECOLOGIC HISTORY:  Patient's last menstrual period was 10/22/2007.  menarche age 66, the patient is GX P0. She went through the change of life in 2011. She did not take hormone replacement.   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 She is also a Architect. Her husband [name] is a retired Visual merchandiser (he taught at [employer]).  In May 2020 [place].  [name] has 2 children from an earlier marriage, Rockey Situ who teaches in [place], and Aiyla Baucom,  who works at the [employer] in in [place]. He has 1 grand son, aged 28. The patient and her husband are not church attenders.   ADVANCED DIRECTIVES:  Not in place   HEALTH MAINTENANCE: Social History   Tobacco Use  . Smoking status: Never Smoker  . Smokeless tobacco: Never Used  Substance Use Topics  . Alcohol use: Yes    Alcohol/week: 1.0 - 2.0 standard drinks    Types: 1 - 2 Standard drinks or equivalent per week    Comment: social  . Drug use: No     Colonoscopy:  May 2016  PAP:  Bone density: 07/01/2016 at [facility] showed normal, with a T score of -0.7  Lipid panel:  Allergies  Allergen Reactions  . Nsaids Other (See Comments)    H/o gastric bypass - avoid NSAIDs  . Tape Hives, Rash and Other (See Comments)    Adhesives in bandaids    Current Outpatient Medications  Medication Sig Dispense Refill  . acetaminophen (TYLENOL) 325 MG tablet Take 650 mg by mouth 2 (two) times  daily as needed for moderate pain or headache.     . Blood Glucose Monitoring Suppl (ONETOUCH VERIO FLEX SYSTEM) w/Device KIT 1 kit by Does not apply route 4 (four) times daily. Test blood sugars four times daily 1 kit 0  . buPROPion (WELLBUTRIN SR) 200 MG 12 hr tablet TAKE 1 TABLET BY MOUTH DAILY AT 12 NOON. 30 tablet 1  .  CALCIUM CITRATE-VITAMIN D PO Take 1 tablet by mouth 3 (three) times daily.     . carvedilol (COREG) 3.125 MG tablet Take 1 tablet (3.125 mg total) by mouth 2 (two) times daily. 60 tablet 3  . cephALEXin (KEFLEX) 500 MG capsule Take 1 capsule (500 mg total) by mouth 2 (two) times daily. 14 capsule 0  . cholecalciferol (VITAMIN D) 1000 units tablet Take 1,000 Units by mouth daily.    . cholestyramine (QUESTRAN) 4 g packet Take 1 packet (4 g total) by mouth 2 (two) times daily as needed. 10 each 12  . Cyanocobalamin (VITAMIN B-12 PO) Take 1 tablet by mouth every 14 (fourteen) days.    . diphenhydrAMINE (BENADRYL) 25 MG tablet Take 25 mg by mouth daily as needed for allergies or sleep.     [name] doxycycline (VIBRA-TABS) 100 MG tablet Take 1 tablet (100 mg total) by mouth 2 (two) times daily. 20 tablet 0  . DUREZOL 0.05 % EMUL     . glucose blood (ONETOUCH VERIO) test strip Use as instructed 100 each 1  . Insulin Glargine (LANTUS SOLOSTAR) 100 UNIT/ML Solostar Pen Inject 12 Units into the skin daily. 5 pen 1  . liraglutide (VICTOZA) 18 MG/3ML SOPN INJECT 1.8MG(0.3 MLS) INTO THE SKIN EVERY MORNING 3 pen 1  . loratadine (CLARITIN) 10 MG tablet Take 10 mg by mouth as needed for allergies.    [name] losartan (COZAAR) 100 MG tablet Take 1 tablet (100 mg total) by mouth daily. 30 tablet 1  . metFORMIN (GLUCOPHAGE) 500 MG tablet Take 1 tablet (500 mg total) by mouth daily. 30 tablet 1  . ofloxacin (OCUFLOX) 0.3 % ophthalmic solution Place 1 drop into both eyes as needed.     Glory Rosebush Delica Lancets 40J MISC 1 Package by Does not apply route daily. 100 each 0  . oxyCODONE-acetaminophen  (PERCOCET/ROXICET) 5-325 MG tablet Take 1-2 tablets by mouth every 6 (six) hours as needed for severe pain. 30 tablet 0  . Pediatric Multivitamins-Iron (FLINTSTONES PLUS IRON) chewable tablet Chew 1 tablet by mouth 2 (two) times daily.    . Probiotic Product (CVS PROBIOTIC PO) Take 1 capsule by mouth daily.     . prochlorperazine (COMPAZINE) 10 MG tablet Take 1 tablet (10 mg total) by mouth every 6 (six) hours as needed for nausea or vomiting. 30 tablet 0  . Propylene Glycol (SYSTANE BALANCE OP) Place 1-2 drops into both eyes 3 (three) times daily as needed (for dry eyes).     [name] senna-docusate (SENNA PLUS) 8.6-50 MG tablet Take 1 tablet by mouth daily as needed for moderate constipation.     . SURE COMFORT PEN NEEDLES 32G X 4 MM MISC USE WITH INJECTIONS 100 each 0  . tobramycin (TOBREX) 0.3 % ophthalmic solution     . topiramate (TOPAMAX) 25 MG tablet Take 1 tablet (25 mg total) by mouth daily. 30 tablet 1  . vortioxetine HBr (TRINTELLIX) 20 MG TABS tablet Take 1 tablet (20 mg total) by mouth at bedtime. 30 tablet 1   No current facility-administered medications for this visit.    OBJECTIVE: Middle-aged white woman in no acute distress  Vitals:   01/19/19 1239  BP: (!) 113/51  Pulse: 70  Resp: 18  Temp: 98.3 F (36.8 C)  SpO2: 99%     Body mass index is 27.62 kg/m.     ECOG FS:1 - Symptomatic but completely ambulatory   Filed Weights   01/19/19 1239  Weight: 187 lb (84.8 kg)  Sclerae unicteric, EOMs intact Wearing a mask No cervical or supraclavicular adenopathy Lungs no rales or rhonchi Heart regular rate and rhythm Abd soft, nontender, positive bowel sounds MSK no focal spinal tenderness, no upper extremity lymphedema Neuro: nonfocal, well oriented, appropriate affect Breasts: The right breast is status post mastectomy.  There is no evidence of chest wall recurrence.  The left breast is benign.  Both axillae are benign.   LAB RESULTS:  CMP     Component Value  Date/Time   NA 142 01/19/2019 1228   NA 143 03/11/2017 0841   NA 140 01/07/2017 0758   K 4.2 01/19/2019 1228   K 4.1 01/07/2017 0758   CL 114 (H) 01/19/2019 1228   CO2 20 (L) 01/19/2019 1228   CO2 24 01/07/2017 0758   GLUCOSE 98 01/19/2019 1228   GLUCOSE 134 01/07/2017 0758   BUN 33 (H) 01/19/2019 1228   BUN 40 (H) 03/11/2017 0841   BUN 33.6 (H) 01/07/2017 0758   CREATININE 1.48 (H) 01/19/2019 1228   CREATININE 1.62 (H) 04/28/2018 0915   CREATININE 1.5 (H) 01/07/2017 0758   CALCIUM 8.7 (L) 01/19/2019 1228   CALCIUM 9.3 01/07/2017 0758   PROT 6.2 (L) 01/19/2019 1228   PROT 6.9 03/11/2017 0841   PROT 6.7 01/07/2017 0758   ALBUMIN 3.7 01/19/2019 1228   ALBUMIN 4.5 03/11/2017 0841   ALBUMIN 3.6 01/07/2017 0758   AST 45 (H) 01/19/2019 1228   AST 43 (H) 04/28/2018 0915   AST 51 (H) 01/07/2017 0758   ALT 67 (H) 01/19/2019 1228   ALT 61 (H) 04/28/2018 0915   ALT 82 (H) 01/07/2017 0758   ALKPHOS 104 01/19/2019 1228   ALKPHOS 115 01/07/2017 0758   BILITOT 0.5 01/19/2019 1228   BILITOT 0.6 04/28/2018 0915   BILITOT 0.72 01/07/2017 0758   GFRNONAA 37 (L) 01/19/2019 1228   GFRNONAA 33 (L) 04/28/2018 0915   GFRNONAA 55 (L) 02/18/2013 0827   GFRAA 42 (L) 01/19/2019 1228   GFRAA 38 (L) 04/28/2018 0915   GFRAA 63 02/18/2013 0827    INo results found for: SPEP, UPEP  Lab Results  Component Value Date   WBC 5.3 01/19/2019   NEUTROABS 3.7 01/19/2019   HGB 10.3 (L) 01/19/2019   HCT 31.1 (L) 01/19/2019   MCV 94.5 01/19/2019   PLT 146 (L) 01/19/2019      Chemistry      Component Value Date/Time   NA 142 01/19/2019 1228   NA 143 03/11/2017 0841   NA 140 01/07/2017 0758   K 4.2 01/19/2019 1228   K 4.1 01/07/2017 0758   CL 114 (H) 01/19/2019 1228   CO2 20 (L) 01/19/2019 1228   CO2 24 01/07/2017 0758   BUN 33 (H) 01/19/2019 1228   BUN 40 (H) 03/11/2017 0841   BUN 33.6 (H) 01/07/2017 0758   CREATININE 1.48 (H) 01/19/2019 1228   CREATININE 1.62 (H) 04/28/2018 0915    CREATININE 1.5 (H) 01/07/2017 0758      Component Value Date/Time   CALCIUM 8.7 (L) 01/19/2019 1228   CALCIUM 9.3 01/07/2017 0758   ALKPHOS 104 01/19/2019 1228   ALKPHOS 115 01/07/2017 0758   AST 45 (H) 01/19/2019 1228   AST 43 (H) 04/28/2018 0915   AST 51 (H) 01/07/2017 0758   ALT 67 (H) 01/19/2019 1228   ALT 61 (H) 04/28/2018 0915   ALT 82 (H) 01/07/2017 0758   BILITOT 0.5 01/19/2019 1228   BILITOT 0.6 04/28/2018 0915   BILITOT 0.72 01/07/2017  0768       No results found for: LABCA2  No components found for: LABCA125  No results for input(s): INR in the last 168 hours.  Urinalysis    Component Value Date/Time   COLORURINE YELLOW 07/21/2015 [place] 07/21/2015 2328   LABSPEC 1.020 07/21/2015 2328   PHURINE 6.0 07/21/2015 2328   GLUCOSEU NEGATIVE 07/21/2015 2328   HGBUR TRACE (A) 07/21/2015 2328   BILIRUBINUR NEGATIVE 07/21/2015 2328   BILIRUBINUR neg 06/29/2013 [place] 07/21/2015 2328   PROTEINUR 30 (A) 07/21/2015 2328   UROBILINOGEN negative 06/29/2013 1017   NITRITE NEGATIVE 07/21/2015 2328   LEUKOCYTESUR NEGATIVE 07/21/2015 2328    STUDIES: MR LIVER W WO CONTRAST  Result Date: 01/13/2019 CLINICAL DATA:  Metastatic breast cancer to the liver. Restaging. EXAM: MRI ABDOMEN WITHOUT AND WITH CONTRAST TECHNIQUE: Multiplanar multisequence MR imaging of the abdomen was performed both before and after the administration of intravenous contrast. CONTRAST:  15m GADAVIST GADOBUTROL 1 MMOL/ML IV SOLN COMPARISON:  09/14/2018 FINDINGS: Despite efforts by the technologist and patient, motion artifact is present on today's exam and could not be eliminated. This reduces exam sensitivity and specificity. Lower chest: Unremarkable Hepatobiliary: Small gallstones are subtle dependently in the gallbladder for example on image 30/3. Site of previous treated metastatic lesion in the dome of the right hepatic lobe laterally is again observed with very  faintly accentuated T2 signal on image 14/3, but no new or progressive findings. No additional liver lesions are observed. No biliary dilatation. Pancreas:  Unremarkable Spleen: Stable 4 mm focus of hypoenhancement in the upper spleen on image 52/1002, associated with subtle accentuated T2 signal and delayed enhancement, probably a small splenic hemangioma. Adrenals/Urinary Tract: Stable mild thickening of the left adrenal gland with dropout of signal on out of phase images compatible with small adenoma, and without hypermetabolic activity in this vicinity previous PET-CT. Stomach/Bowel: Postoperative findings in the stomach favoring gastric bypass. Vascular/Lymphatic: Aortoiliac atherosclerotic vascular disease. Small porta hepatis lymph nodes are not pathologically enlarged. Other:  No supplemental non-categorized findings. Musculoskeletal: Unremarkable IMPRESSION: 1. Stable appearance of the treated metastatic lesion in the dome of the right hepatic lobe. No new or progressive findings. 2. Stable small left adrenal adenoma. 3. Cholelithiasis. 4.  Aortic Atherosclerosis (ICD10-I70.0). Electronically Signed   By: WVan ClinesM.D.   On: 01/13/2019 12:55   DG Foot Complete Right  Result Date: 01/13/2019 Please see detailed radiograph report in office note.  ECHOCARDIOGRAM COMPLETE  Result Date: 12/21/2018   ECHOCARDIOGRAM REPORT   Patient Name:   MJANANN BOEVEDate of Exam: 12/21/2018 Medical Rec #:  [id number]          Height:       69.0 in Accession #:    [id number]         Weight:       184.0 lb Date of Birth:  [birth year]          BSA:          1.99 m Patient Age:    89years            BP:           127/54 mmHg Patient Gender: F                   HR:           54 bpm. Exam Location:  Outpatient Procedure: 2D Echo Indications:  chemotherapy evaluation  History:        Patient has prior history of Echocardiogram examinations, most                 recent 09/10/2018. Risk  Factors:Hypertension, Dyslipidemia and                 Diabetes. Breast cancer.  Sonographer:    Jannett Celestine RDCS (AE) Referring Phys: [place]  1. Left ventricular ejection fraction, by visual estimation, is 50 to 55%. The left ventricle has low normal function. There is no left ventricular hypertrophy. GLS -12.7%.  2. Left ventricular diastolic parameters are consistent with Grade I diastolic dysfunction (impaired relaxation).  3. The left ventricle has no regional wall motion abnormalities.  4. Global right ventricle has mildly reduced systolic function.The right ventricular size is normal. No increase in right ventricular wall thickness.  5. Left atrial size was normal.  6. Right atrial size was normal.  7. Mild mitral annular calcification.  8. The mitral valve is normal in structure. No evidence of mitral valve regurgitation. No evidence of mitral stenosis.  9. The tricuspid valve is normal in structure. Tricuspid valve regurgitation is not demonstrated. 10. The aortic valve is tricuspid. Aortic valve regurgitation is not visualized. No evidence of aortic valve sclerosis or stenosis. 11. TR signal is inadequate for assessing pulmonary artery systolic pressure. 12. The inferior vena cava is normal in size with greater than 50% respiratory variability, suggesting right atrial pressure of 3 mmHg. FINDINGS  Left Ventricle: Left ventricular ejection fraction, by visual estimation, is 50 to 55%. The left ventricle has low normal function. The left ventricle has no regional wall motion abnormalities. There is no left ventricular hypertrophy. Left ventricular diastolic parameters are consistent with Grade I diastolic dysfunction (impaired relaxation). Right Ventricle: The right ventricular size is normal. No increase in right ventricular wall thickness. Global RV systolic function is has mildly reduced systolic function. Left Atrium: Left atrial size was normal in size. Right Atrium: Right  atrial size was normal in size Pericardium: There is no evidence of pericardial effusion. Mitral Valve: The mitral valve is normal in structure. Mild mitral annular calcification. No evidence of mitral valve stenosis by observation. No evidence of mitral valve regurgitation. Tricuspid Valve: The tricuspid valve is normal in structure. Tricuspid valve regurgitation is not demonstrated. Aortic Valve: The aortic valve is tricuspid. Aortic valve regurgitation is not visualized. The aortic valve is structurally normal, with no evidence of sclerosis or stenosis. Pulmonic Valve: The pulmonic valve was normal in structure. Pulmonic valve regurgitation is not visualized. Aorta: The aortic root is normal in size and structure. Venous: The inferior vena cava is normal in size with greater than 50% respiratory variability, suggesting right atrial pressure of 3 mmHg. IAS/Shunts: No atrial level shunt detected by color flow Doppler.  LEFT VENTRICLE PLAX 2D LVIDd:         4.50 cm  Diastology LVIDs:         3.10 cm  LV e' lateral:   5.22 cm/s LV PW:         1.00 cm  LV E/e' lateral: 13.2 LV IVS:        0.90 cm  LV e' medial:    5.55 cm/s LVOT diam:     2.10 cm  LV E/e' medial:  12.4 LV SV:         55 ml LV SV Index:   26.84 LVOT Area:  3.46 cm  LEFT ATRIUM             Index       RIGHT ATRIUM           Index LA diam:        3.60 cm 1.81 cm/m  RA Area:     11.50 cm LA Vol (A2C):   46.7 ml 23.42 ml/m RA Volume:   21.00 ml  10.53 ml/m LA Vol (A4C):   34.1 ml 17.10 ml/m LA Biplane Vol: 41.4 ml 20.76 ml/m  AORTIC VALVE LVOT Vmax:   71.10 cm/s LVOT Vmean:  47.100 cm/s LVOT VTI:    0.127 m  AORTA Ao Root diam: 3.00 cm MITRAL VALVE MV Area (PHT): 2.26 cm             SHUNTS MV PHT:        97.15 msec           Systemic VTI:  0.13 m MV Decel Time: 335 msec             Systemic Diam: 2.10 cm MV E velocity: 69.00 cm/s 103 cm/s MV A velocity: 72.40 cm/s 70.3 cm/s MV E/A ratio:  0.95       1.5  Loralie Champagne MD Electronically signed by  Loralie Champagne MD Signature Date/Time: 12/21/2018/3:27:47 PM    Final      ASSESSMENT: 41 y.o. [place] woman status post right breast upper outer quadrant and right axillary lymph node biopsy 07/19/2014 for a cT2  pN1, stage IIB  invasive ductal carcinoma, grade 2, estrogen and progesterone receptor negative, with an MIB-1 of 90%, and HER-2 amplified  (1) neoadjuvant chemotherapy started 08/08/2014, consisting of carboplatin, docetaxel, trastuzumab and pertuzumab given every 3 weeks 6, completed 11/21/2014  (a) breast MRI after initial 3 cycles shows a significant response  (b) breast MRI after 6 cycles shows a complete radiologic response   (2) trastuzumab continued through 10/02/2015 (to end of capecitabine therapy)  (a) echo 02/13/2015 shows an EF of 60-65%  (b)  echo 05/09/2015 shows an ejection fraction of 60-65%  (c) echocardiogram 06/27/2015 shows an ejection fraction of 55-60%.  (d) echocardiogram 01/22/2017 shows an ejection fraction of 55-60%  (3) right lumpectomy with sentinel lymph node biopsy on 12/27/14 showed a residual  ypT2 ypN1a, stage IIB invasive ductal carcinoma, grade 2, with repeat estrogen and progesterone receptors again negative  (4) status post right oncoplastic surgery (with left reduction mammoplasty) 01/06/2015 showing in the right breast multiple foci of intralymphatic carcinoma in random sections  (5)  Right modified radical mastectomy 02/15/2014 showed no residual invasive disease; there was DCIS but margins were negative; all 15 axillary lymph nodes were clear; ypTis ypN0  (a)  The patient has decided against reconstruction.  (6)  Postmastectomy radiation completed 05/18/2015 with capecitabine sensitization  (7) adjuvant capecitabine continued through 09/27/2015  (8) hepatic steatosis with increased fibrosis score (at risk for cirrhosis)  (9) thyroid biopsy (left lobe inferiorly) 2 shows benign tissue 06/28/2015  (10) LOCAL RECURRENCE:  (a) PET  scan 12/06/2015 Notes a right retropectoral lymph node measuring 0.9 cm with an SUV max of 5.5  (b) repeat PET scan 03/11/2016 finds the right subpectoral lymph node to now measure 1.3 cm with an SUV max of 11.8.  (c) PET scan 05/07/2016 shows a significant response to T-DM 1  (d) PET scan 07/10/2017 is clear except for what appears to be a single new liver lesion   (11) started T-DM1 03/21/2016, completed  8 doses 08/13/2016  (a) PET scan 08/19/2016 shows a good response but measurable residual disease  (b) PET scan on 01/16/2017 shows a continuing response  (12) maintenance trastuzumab started 09/03/2016--changed to every 28 days as of 01/28/2017  (a) repeat echocardiogram 07/08/2016 shows an ejection fraction of 60-65%.  (b) echocardiogram on 10/08/2016 demonstrates LVEF of 55-60% (followed by Dr. Haroldine Laws)  (c) echo 01/22/2017 shows an ejection fraction in the 55-60% range  (d) echo 04/24/2017 shows an ejection fraction in the 55-60% range (recommendation for echo every 6 months by Dr. Aundra Dubin)  (e) trastuzumab changed to T-DM 1 after trastuzumab dose 07/15/2017  METASTATIC DISEASE: July 2019 (13)  Radiation to the area of persistent disease in the right upper chest completed 10/30/2016   (14) T-DM 1 resumed 08/12/2017, repeated every 21 days  (a) baseline liver MRI 08/02/2017 shows a 1.4 cm mass, "hot" on PET 07/10/2017  (b) repeat liver MRI 11/18/2017 after 3 cycles of T-DM 1, shows slight enlargement  (c) T-DM 1 discontinued after 09/23/2017 dose  (15) trastuzumab resumed 11/18/2017, to be repeated every 4 weeks,   (a) changed back to every 3 weeks in 03/2018  (b) echo 09/18/2018 shows an ejection fraction in the 50-55% range.  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  There is a right subpectoral lymph node noted as well  (c) CA 27-29 and CA 19-9 uninformative; CEA 6.72 OCT 2019   (17) started Abraxane on 12/111/2019, given days 1 and 8 on a 21 day cycle   (a) restaging with MRI liver 02/24/2018 shows a gratifying initial response  (b) PET scan 05/14/2018 shows resolution of the measurable disease in the liver  (c) liver MRI 09/14/2018 shows no change compared to prior PET and MRI.  (d) Abraxane discontinued after 09/01/2018 dose  (18) consider Yttrium or other local treatment to liver per IR if liver recurrence develops   PLAN: [name] is now a year and a half out from definitive diagnosis of metastatic breast cancer.  Her disease is very well controlled.  She is generally tolerating her treatment well.  We reviewed results of the liver MRI which are very favorable.  We discussed how to manage the diarrhea issue and I have strongly encouraged her to take Questran every evening.  We discussed exactly how to do that  At this point particularly given the fact that there has been a slight decreased in her ejection fraction over time I am going to change the trastuzumab/Pertuzumab to every 4 weeks.  She will see me again in March and at that point I will set her up for restaging studies  She knows to call for any other issues that may develop before that visit.  Virgie Dad. Jaxxon Naeem, MD  01/19/19 1:20 PM Medical Oncology and Hematology [facility] [place], [place] [address] Tel. [phone]    Fax. [phone]   I, Wilburn Mylar, am acting as scribe for Dr. Virgie Dad. Ritha Sampedro.  I, Lurline Del MD, have reviewed the above documentation for accuracy and completeness, and I agree with the above.

## 2019-01-19 ENCOUNTER — Inpatient Hospital Stay (HOSPITAL_BASED_OUTPATIENT_CLINIC_OR_DEPARTMENT_OTHER): Payer: Medicare Other | Admitting: Oncology

## 2019-01-19 ENCOUNTER — Inpatient Hospital Stay: Payer: Medicare Other

## 2019-01-19 ENCOUNTER — Other Ambulatory Visit: Payer: Self-pay

## 2019-01-19 VITALS — BP 113/51 | HR 70 | Temp 98.3°F | Resp 18 | Ht 69.0 in | Wt 187.0 lb

## 2019-01-19 DIAGNOSIS — C801 Malignant (primary) neoplasm, unspecified: Secondary | ICD-10-CM

## 2019-01-19 DIAGNOSIS — C50911 Malignant neoplasm of unspecified site of right female breast: Secondary | ICD-10-CM

## 2019-01-19 DIAGNOSIS — C787 Secondary malignant neoplasm of liver and intrahepatic bile duct: Secondary | ICD-10-CM | POA: Diagnosis not present

## 2019-01-19 DIAGNOSIS — Z171 Estrogen receptor negative status [ER-]: Secondary | ICD-10-CM

## 2019-01-19 DIAGNOSIS — C50411 Malignant neoplasm of upper-outer quadrant of right female breast: Secondary | ICD-10-CM

## 2019-01-19 DIAGNOSIS — Z95828 Presence of other vascular implants and grafts: Secondary | ICD-10-CM

## 2019-01-19 DIAGNOSIS — Z5112 Encounter for antineoplastic immunotherapy: Secondary | ICD-10-CM | POA: Diagnosis not present

## 2019-01-19 LAB — CBC WITH DIFFERENTIAL/PLATELET
Abs Immature Granulocytes: 0.03 10*3/uL (ref 0.00–0.07)
Basophils Absolute: 0 10*3/uL (ref 0.0–0.1)
Basophils Relative: 0 %
Eosinophils Absolute: 0.2 10*3/uL (ref 0.0–0.5)
Eosinophils Relative: 3 %
HCT: 31.1 % — ABNORMAL LOW (ref 36.0–46.0)
Hemoglobin: 10.3 g/dL — ABNORMAL LOW (ref 12.0–15.0)
Immature Granulocytes: 1 %
Lymphocytes Relative: 18 %
Lymphs Abs: 0.9 10*3/uL (ref 0.7–4.0)
MCH: 31.3 pg (ref 26.0–34.0)
MCHC: 33.1 g/dL (ref 30.0–36.0)
MCV: 94.5 fL (ref 80.0–100.0)
Monocytes Absolute: 0.5 10*3/uL (ref 0.1–1.0)
Monocytes Relative: 9 %
Neutro Abs: 3.7 10*3/uL (ref 1.7–7.7)
Neutrophils Relative %: 69 %
Platelets: 146 10*3/uL — ABNORMAL LOW (ref 150–400)
RBC: 3.29 MIL/uL — ABNORMAL LOW (ref 3.87–5.11)
RDW: 13.4 % (ref 11.5–15.5)
WBC: 5.3 10*3/uL (ref 4.0–10.5)
nRBC: 0 % (ref 0.0–0.2)

## 2019-01-19 LAB — COMPREHENSIVE METABOLIC PANEL
ALT: 67 U/L — ABNORMAL HIGH (ref 0–44)
AST: 45 U/L — ABNORMAL HIGH (ref 15–41)
Albumin: 3.7 g/dL (ref 3.5–5.0)
Alkaline Phosphatase: 104 U/L (ref 38–126)
Anion gap: 8 (ref 5–15)
BUN: 33 mg/dL — ABNORMAL HIGH (ref 8–23)
CO2: 20 mmol/L — ABNORMAL LOW (ref 22–32)
Calcium: 8.7 mg/dL — ABNORMAL LOW (ref 8.9–10.3)
Chloride: 114 mmol/L — ABNORMAL HIGH (ref 98–111)
Creatinine, Ser: 1.48 mg/dL — ABNORMAL HIGH (ref 0.44–1.00)
GFR calc Af Amer: 42 mL/min — ABNORMAL LOW (ref >60)
GFR calc non Af Amer: 37 mL/min — ABNORMAL LOW (ref >60)
Glucose, Bld: 98 mg/dL (ref 70–99)
Potassium: 4.2 mmol/L (ref 3.5–5.1)
Sodium: 142 mmol/L (ref 135–145)
Total Bilirubin: 0.5 mg/dL (ref 0.3–1.2)
Total Protein: 6.2 g/dL — ABNORMAL LOW (ref 6.5–8.1)

## 2019-01-19 MED ORDER — SODIUM CHLORIDE 0.9 % IV SOLN
Freq: Once | INTRAVENOUS | Status: AC
  Filled 2019-01-19: qty 250

## 2019-01-19 MED ORDER — HEPARIN SOD (PORK) LOCK FLUSH 100 UNIT/ML IV SOLN
500.0000 [IU] | Freq: Once | INTRAVENOUS | Status: AC | PRN
  Administered 2019-01-19: 500 [IU]
  Filled 2019-01-19: qty 5

## 2019-01-19 MED ORDER — DIPHENHYDRAMINE HCL 25 MG PO CAPS
25.0000 mg | ORAL_CAPSULE | Freq: Once | ORAL | Status: AC
  Administered 2019-01-19: 25 mg via ORAL

## 2019-01-19 MED ORDER — TRASTUZUMAB-ANNS CHEMO 420 MG IV SOLR
6.0000 mg/kg | Freq: Once | INTRAVENOUS | Status: AC
  Administered 2019-01-19: 546 mg via INTRAVENOUS
  Filled 2019-01-19: qty 26

## 2019-01-19 MED ORDER — ACETAMINOPHEN 325 MG PO TABS
650.0000 mg | ORAL_TABLET | Freq: Once | ORAL | Status: AC
  Administered 2019-01-19: 650 mg via ORAL

## 2019-01-19 MED ORDER — SODIUM CHLORIDE 0.9% FLUSH
10.0000 mL | INTRAVENOUS | Status: DC | PRN
  Administered 2019-01-19: 10 mL
  Filled 2019-01-19: qty 10

## 2019-01-19 MED ORDER — SODIUM CHLORIDE 0.9 % IV SOLN
420.0000 mg | Freq: Once | INTRAVENOUS | Status: AC
  Administered 2019-01-19: 420 mg via INTRAVENOUS
  Filled 2019-01-19: qty 14

## 2019-01-19 MED ORDER — SODIUM CHLORIDE 0.9% FLUSH
10.0000 mL | INTRAVENOUS | Status: DC | PRN
  Administered 2019-01-19: 10 mL via INTRAVENOUS
  Filled 2019-01-19: qty 10

## 2019-01-19 MED ORDER — DIPHENHYDRAMINE HCL 25 MG PO CAPS
ORAL_CAPSULE | ORAL | Status: AC
  Filled 2019-01-19: qty 1

## 2019-01-19 MED ORDER — ACETAMINOPHEN 325 MG PO TABS
ORAL_TABLET | ORAL | Status: AC
  Filled 2019-01-19: qty 2

## 2019-01-19 NOTE — Progress Notes (Signed)
Pt declined to stay for post perjeta observation.

## 2019-01-19 NOTE — Patient Instructions (Signed)
[facility] Discharge Instructions for Patients Receiving Chemotherapy  Today you received the following chemotherapy agents Trastuzumab and Perjeta  To help prevent nausea and vomiting after your treatment, we encourage you to take your nausea medication as directed.    If you develop nausea and vomiting that is not controlled by your nausea medication, call the clinic.   BELOW ARE SYMPTOMS THAT SHOULD BE REPORTED IMMEDIATELY:  *FEVER GREATER THAN 100.5 F  *CHILLS WITH OR WITHOUT FEVER  NAUSEA AND VOMITING THAT IS NOT CONTROLLED WITH YOUR NAUSEA MEDICATION  *UNUSUAL SHORTNESS OF BREATH  *UNUSUAL BRUISING OR BLEEDING  TENDERNESS IN MOUTH AND THROAT WITH OR WITHOUT PRESENCE OF ULCERS  *URINARY PROBLEMS  *BOWEL PROBLEMS  UNUSUAL RASH Items with * indicate a potential emergency and should be followed up as soon as possible.  Feel free to call the clinic should you have any questions or concerns. The clinic phone number is [phone].  Please show the CHEMO ALERT CARD at check-in to the Emergency Department and triage nurse.   

## 2019-01-21 ENCOUNTER — Telehealth: Payer: Self-pay | Admitting: Oncology

## 2019-01-21 NOTE — Telephone Encounter (Signed)
Scheduled per 12/29 los, patient has been called and notified.

## 2019-01-25 ENCOUNTER — Other Ambulatory Visit: Payer: Self-pay

## 2019-01-25 ENCOUNTER — Encounter (INDEPENDENT_AMBULATORY_CARE_PROVIDER_SITE_OTHER): Payer: Self-pay | Admitting: Family Medicine

## 2019-01-25 ENCOUNTER — Encounter: Payer: Self-pay | Admitting: Adult Health

## 2019-01-25 ENCOUNTER — Ambulatory Visit (INDEPENDENT_AMBULATORY_CARE_PROVIDER_SITE_OTHER): Payer: Medicare PPO | Admitting: Family Medicine

## 2019-01-25 VITALS — BP 102/66 | HR 72 | Temp 98.2°F | Ht 69.0 in | Wt 178.0 lb

## 2019-01-25 DIAGNOSIS — E669 Obesity, unspecified: Secondary | ICD-10-CM

## 2019-01-25 DIAGNOSIS — F3289 Other specified depressive episodes: Secondary | ICD-10-CM | POA: Diagnosis not present

## 2019-01-25 DIAGNOSIS — E119 Type 2 diabetes mellitus without complications: Secondary | ICD-10-CM

## 2019-01-25 DIAGNOSIS — I10 Essential (primary) hypertension: Secondary | ICD-10-CM

## 2019-01-25 DIAGNOSIS — Z794 Long term (current) use of insulin: Secondary | ICD-10-CM

## 2019-01-25 DIAGNOSIS — Z6830 Body mass index (BMI) 30.0-30.9, adult: Secondary | ICD-10-CM | POA: Diagnosis not present

## 2019-01-25 MED ORDER — VICTOZA 18 MG/3ML ~~LOC~~ SOPN: PEN_INJECTOR | SUBCUTANEOUS | 1 refills | Status: DC

## 2019-01-25 MED ORDER — VORTIOXETINE HBR 20 MG PO TABS: 20.0000 mg | ORAL_TABLET | Freq: Every day | ORAL | 1 refills | Status: DC

## 2019-01-25 MED ORDER — TOPIRAMATE 25 MG PO TABS: 25.0000 mg | ORAL_TABLET | Freq: Every day | ORAL | 1 refills | Status: DC

## 2019-01-25 MED ORDER — LOSARTAN POTASSIUM 100 MG PO TABS: 100.0000 mg | ORAL_TABLET | Freq: Every day | ORAL | 1 refills | Status: DC

## 2019-01-25 MED ORDER — METFORMIN HCL 500 MG PO TABS: 500.0000 mg | ORAL_TABLET | Freq: Every day | ORAL | 1 refills | Status: DC

## 2019-01-26 LAB — HEMOGLOBIN A1C
Est. average glucose Bld gHb Est-mCnc: 111 mg/dL
Hgb A1c MFr Bld: 5.5 % (ref 4.8–5.6)

## 2019-01-26 LAB — COMPREHENSIVE METABOLIC PANEL
ALT: 54 IU/L — ABNORMAL HIGH (ref 0–32)
AST: 41 IU/L — ABNORMAL HIGH (ref 0–40)
Albumin/Globulin Ratio: 1.9 (ref 1.2–2.2)
Albumin: 4.3 g/dL (ref 3.8–4.8)
Alkaline Phosphatase: 119 IU/L — ABNORMAL HIGH (ref 39–117)
BUN/Creatinine Ratio: 15 (ref 12–28)
BUN: 25 mg/dL (ref 8–27)
Bilirubin Total: 0.7 mg/dL (ref 0.0–1.2)
CO2: 21 mmol/L (ref 20–29)
Calcium: 9.3 mg/dL (ref 8.7–10.3)
Chloride: 112 mmol/L — ABNORMAL HIGH (ref 96–106)
Creatinine, Ser: 1.64 mg/dL — ABNORMAL HIGH (ref 0.57–1.00)
GFR calc Af Amer: 37 mL/min/{1.73_m2} — ABNORMAL LOW (ref >59)
GFR calc non Af Amer: 32 mL/min/{1.73_m2} — ABNORMAL LOW (ref >59)
Globulin, Total: 2.3 g/dL (ref 1.5–4.5)
Glucose: 81 mg/dL (ref 65–99)
Potassium: 4 mmol/L (ref 3.5–5.2)
Sodium: 146 mmol/L — ABNORMAL HIGH (ref 134–144)
Total Protein: 6.6 g/dL (ref 6.0–8.5)

## 2019-01-26 LAB — LIPID PANEL WITH LDL/HDL RATIO
Cholesterol, Total: 154 mg/dL (ref 100–199)
HDL: 62 mg/dL (ref >39)
LDL Chol Calc (NIH): 75 mg/dL (ref 0–99)
LDL/HDL Ratio: 1.2 ratio (ref 0.0–3.2)
Triglycerides: 90 mg/dL (ref 0–149)
VLDL Cholesterol Cal: 17 mg/dL (ref 5–40)

## 2019-01-26 LAB — INSULIN, RANDOM: INSULIN: 7.6 u[IU]/mL (ref 2.6–24.9)

## 2019-01-26 NOTE — Progress Notes (Signed)
Chief Complaint: Darlene [name] is here to discuss her progress with her obesity treatment plan. Darlene [name] is keeping a food journal and adhering to recommended goals of 1300-1500 calories and 75+ grams of protein and states she is following her eating plan approximately 75% of the time. Darlene [name] states she is exercising 0 minutes 0 times per week.  Today's visit was #: 28  Starting weight: 225 lbs Starting date: 08/09/2016 Today's weight: 178 lbs Today's date: 01/25/2019 Total lbs lost to date: 47  Total lbs lost since last in-office visit: 6  Subjective:   Interim History: Darlene [name] continues to do well with weight loss even over the holidays. She hasn't been exercising as much due to foot and eye surgery. Hunger is mostly controlled.  General review of systems is unchanged or negative.   Assessment/Plan:     ICD-10-CM   1. Type 2 diabetes mellitus without complication, with long-term current use of insulin (HCC)  E11.9 Comprehensive Metabolic Panel (CMET)   J19.1 HgB A1c    Insulin, random    Lipid Panel With LDL/HDL Ratio    metFORMIN (GLUCOPHAGE) 500 MG tablet    liraglutide (VICTOZA) 18 MG/3ML SOPN  2. Essential hypertension  I10 losartan (COZAAR) 100 MG tablet  3. Other depression  F32.89 topiramate (TOPAMAX) 25 MG tablet    vortioxetine HBr (TRINTELLIX) 20 MG TABS tablet   with emotional eating  4. Class 1 obesity with serious comorbidity and body mass index (BMI) of 30.0 to 30.9 in adult, unspecified obesity type  E66.9    Z68.30    Starting BMI greater then 30   1. Type 2 diabetes mellitus without complication, with long-term current use of insulin ([place]). Darlene [name] is stable on medications. Last A1c was well controlled at 5.4. She is doing well with diet.  Darlene [name] was given a refill on her Victoza and metformin x2 and agrees to follow-up with our clinic in 6 weeks.   2. Essential hypertension. Blood pressure is stable on medications. She recently started Carvedilol  for mild diastolic dysfunction. No chest pain or dizziness. She has done well with weight loss.  Darlene [name] was given a refill on losartan x2 and agrees to follow-up with our clinic in 6 weeks. She will continue medications as is and will continue to monitor.    3. Other depression. Darlene [name]'s mood is stable on medications. She is doing well minimizing emotional eating.  Darlene [name] was given refills on Topamax and Trintellix x2 and agrees to follow-up with our clinic in 6 weeks.    4. Class 1 obesity with serious comorbidity and body mass index (BMI) of 30.0 to 30.9 in adult, unspecified obesity type    Darlene [name] is currently in the action stage of change. As such, her goal is to continue with weight loss efforts. She has agreed to keep a food journal and adhere to recommended goals of 1300-1500 calories and 75+ grams of protein.   We discussed the following exercise goals today: Darlene [name] should follow the adult guidelines. When Darlene [name] cannot meet the adult guidelines, they should be as physically active as their abilities and conditions will allow.  Darlene [name] should do exercises that maintain or improve balance if they are at risk of falling.    We discussed the following behavioral modification strategies today: increasing lean protein intake and meal planning and cooking strategies.  Darlene [name] has agreed to follow-up with our clinic in 6 weeks. She was informed of the importance  of frequent follow-up visits to maximize her success with intensive lifestyle modifications for her multiple health conditions.  Objective:   Blood pressure 102/66, pulse 72, temperature 98.2 F (36.8 C), temperature source Oral, height 5\' 9"  (1.753 m), weight 178 lb (80.7 kg), last menstrual period 10/22/2007, SpO2 97 %. Body mass index is 26.29 kg/m.  General: Cooperative, alert, well developed, in no acute distress. HEENT: Conjunctivae and lids unremarkable. Neck: No thyromegaly.  Cardiovascular: Regular  rhythm.  Lungs: Normal work of breathing. Extremities: No edema.  Neurologic: No focal deficits.   Lab Results  Component Value Date   CREATININE 1.64 (H) 01/25/2019   BUN 25 01/25/2019   NA 146 (H) 01/25/2019   K 4.0 01/25/2019   CL 112 (H) 01/25/2019   CO2 21 01/25/2019   Lab Results  Component Value Date   ALT 54 (H) 01/25/2019   AST 41 (H) 01/25/2019   ALKPHOS 119 (H) 01/25/2019   BILITOT 0.7 01/25/2019   Lab Results  Component Value Date   HGBA1C 5.5 01/25/2019   HGBA1C 5.4 08/27/2018   HGBA1C 5.8 (H) 09/03/2017   HGBA1C 6.1 (H) 03/11/2017   HGBA1C 8.2 (H) 11/05/2016   Lab Results  Component Value Date   INSULIN WILL FOLLOW 01/25/2019   INSULIN 15.3 03/11/2017   INSULIN 18.3 08/09/2016   Lab Results  Component Value Date   TSH 1.54 02/07/2017   Lab Results  Component Value Date   CHOL 154 01/25/2019   HDL 62 01/25/2019   LDLCALC 75 01/25/2019   TRIG 90 01/25/2019   CHOLHDL 2.9 02/18/2013   Lab Results  Component Value Date   WBC 5.3 01/19/2019   HGB 10.3 (L) 01/19/2019   HCT 31.1 (L) 01/19/2019   MCV 94.5 01/19/2019   PLT 146 (L) 01/19/2019   Lab Results  Component Value Date   IRON 58 02/18/2013   TIBC 305 02/18/2013   FERRITIN 133 10/31/2014   Obesity Behavioral Intervention Visit Documentation for Insurance (15 Minutes):   ASK: We discussed the diagnosis of obesity with Darlene [name] today and Darlene [name] agreed to give [name] permission to discuss obesity behavioral modification therapy today.  ASSESS: Darlene [name] has the diagnosis of obesity and her BMI today is 26.4.[name] Darlene [name] is in the action stage of change.   ADVISE: Darlene [name] was educated on the multiple health risks of obesity as well as the benefit of weight loss to improve her health. She was advised of the need for long term treatment and the importance of lifestyle modifications to improve her current health and to decrease her risk of future health problems.  AGREE: Multiple  dietary modification options and treatment options were discussed and Darlene [name] agreed to follow the recommendations documented in the above note.  ARRANGE: Darlene [name] was educated on the importance of frequent visits to treat obesity as outlined per CMS and USPSTF guidelines and agreed to schedule her next follow up appointment today.  Attestation Statements:   Reviewed by clinician on day of visit: allergies, medications, problem list, medical history, surgical history, family history, social history and previous encounter notes.  This visit occurred during the SARS-CoV-2 public health emergency. Safety protocols were in place, including screening questions prior to the visit, additional usage of staff PPE, and extensive cleaning of exam room while observing appropriate contact time as indicated for disinfecting solutions. (CPT [id number])  I, Michaelene Song, am acting as transcriptionist for Dennard Nip, MD  I have reviewed the above documentation for accuracy and completeness, and I  agree with the above. -  Dennard Nip, MD

## 2019-02-01 ENCOUNTER — Encounter: Payer: Self-pay | Admitting: Podiatry

## 2019-02-01 ENCOUNTER — Other Ambulatory Visit: Payer: Self-pay

## 2019-02-01 ENCOUNTER — Ambulatory Visit (INDEPENDENT_AMBULATORY_CARE_PROVIDER_SITE_OTHER): Payer: Medicare PPO | Admitting: Podiatry

## 2019-02-01 ENCOUNTER — Ambulatory Visit (INDEPENDENT_AMBULATORY_CARE_PROVIDER_SITE_OTHER): Payer: Medicare PPO

## 2019-02-01 DIAGNOSIS — M2041 Other hammer toe(s) (acquired), right foot: Secondary | ICD-10-CM | POA: Diagnosis not present

## 2019-02-01 DIAGNOSIS — M2011 Hallux valgus (acquired), right foot: Secondary | ICD-10-CM

## 2019-02-05 NOTE — Progress Notes (Signed)
Subjective: Darlene [name] is a 69 y.o. is seen today in office s/p right foot bunionectomy with hammertoe repair preformed on 12/09/2018.  She has been using the splint for the hallux.  She is not having any significant discomfort to the bunion site but she does it is discomfort which to the second toe as well as swelling to this area.No calf pain, chest pain, shortness of breath.  No other concerns today.    Objective: General: No acute distress, AAOx3  DP/PT pulses palpable 2/4, CRT < 3 sec to all digits.  Protective sensation intact. Motor function intact.  RIGHT foot: Incision is well coapted without any evidence of dehiscence and scars are well formed.  Unfortunate there is still hallux abductus present.  There is edema on the PIPJ of the second toe.  This is where she has majority discomfort but no significant pain upon palpation.  No other areas of discomfort identified today.  No significant callus formation.  No other open lesions or pre-ulcerative lesions.  No pain with calf compression, swelling, warmth, erythema.   Assessment and Plan:  Status post right foot surgery   -Treatment options discussed including all alternatives, risks, and complications -X-rays obtained reviewed.  Imported still bunion present but hardware intact there is increased consolidation across the first metatarsal, proximal phalanx. Increased consolidation of the PIPJ on the 2nd digit but slight displacement compared to prior x-rays.  I reviewed these x-rays with her. -Continue with surgical shoe for now but as she starts to feel better she can transition to regular shoe.  Continue offloading as well as a splint for the bunion, second toe. -Continue ice elevate. -Hallux abductus still present.  If this becomes symptomatic in the future likely will need MPJ arthrodesis. -Monitor for any clinical signs or symptoms of infection and directed to call the office immediately should any occur or go to the  ER.  Trula Slade DPM

## 2019-02-08 ENCOUNTER — Encounter (INDEPENDENT_AMBULATORY_CARE_PROVIDER_SITE_OTHER): Payer: Self-pay | Admitting: Family Medicine

## 2019-02-10 ENCOUNTER — Other Ambulatory Visit (INDEPENDENT_AMBULATORY_CARE_PROVIDER_SITE_OTHER): Payer: Self-pay | Admitting: Family Medicine

## 2019-02-10 DIAGNOSIS — F3289 Other specified depressive episodes: Secondary | ICD-10-CM

## 2019-02-10 DIAGNOSIS — E113513 Type 2 diabetes mellitus with proliferative diabetic retinopathy with macular edema, bilateral: Secondary | ICD-10-CM | POA: Diagnosis not present

## 2019-02-11 DIAGNOSIS — M6281 Muscle weakness (generalized): Secondary | ICD-10-CM | POA: Diagnosis not present

## 2019-02-11 DIAGNOSIS — M25675 Stiffness of left foot, not elsewhere classified: Secondary | ICD-10-CM | POA: Diagnosis not present

## 2019-02-11 DIAGNOSIS — M25672 Stiffness of left ankle, not elsewhere classified: Secondary | ICD-10-CM | POA: Diagnosis not present

## 2019-02-11 DIAGNOSIS — M25674 Stiffness of right foot, not elsewhere classified: Secondary | ICD-10-CM | POA: Diagnosis not present

## 2019-02-11 DIAGNOSIS — M25671 Stiffness of right ankle, not elsewhere classified: Secondary | ICD-10-CM | POA: Diagnosis not present

## 2019-02-11 DIAGNOSIS — M62572 Muscle wasting and atrophy, not elsewhere classified, left ankle and foot: Secondary | ICD-10-CM | POA: Diagnosis not present

## 2019-02-11 DIAGNOSIS — M62571 Muscle wasting and atrophy, not elsewhere classified, right ankle and foot: Secondary | ICD-10-CM | POA: Diagnosis not present

## 2019-02-11 DIAGNOSIS — R262 Difficulty in walking, not elsewhere classified: Secondary | ICD-10-CM | POA: Diagnosis not present

## 2019-02-15 DIAGNOSIS — M25675 Stiffness of left foot, not elsewhere classified: Secondary | ICD-10-CM | POA: Diagnosis not present

## 2019-02-15 DIAGNOSIS — M6281 Muscle weakness (generalized): Secondary | ICD-10-CM | POA: Diagnosis not present

## 2019-02-15 DIAGNOSIS — M62571 Muscle wasting and atrophy, not elsewhere classified, right ankle and foot: Secondary | ICD-10-CM | POA: Diagnosis not present

## 2019-02-15 DIAGNOSIS — M25674 Stiffness of right foot, not elsewhere classified: Secondary | ICD-10-CM | POA: Diagnosis not present

## 2019-02-15 DIAGNOSIS — M62572 Muscle wasting and atrophy, not elsewhere classified, left ankle and foot: Secondary | ICD-10-CM | POA: Diagnosis not present

## 2019-02-15 DIAGNOSIS — M25672 Stiffness of left ankle, not elsewhere classified: Secondary | ICD-10-CM | POA: Diagnosis not present

## 2019-02-15 DIAGNOSIS — R262 Difficulty in walking, not elsewhere classified: Secondary | ICD-10-CM | POA: Diagnosis not present

## 2019-02-15 DIAGNOSIS — M25671 Stiffness of right ankle, not elsewhere classified: Secondary | ICD-10-CM | POA: Diagnosis not present

## 2019-02-16 ENCOUNTER — Other Ambulatory Visit: Payer: Self-pay

## 2019-02-16 ENCOUNTER — Inpatient Hospital Stay: Payer: Medicare PPO

## 2019-02-16 ENCOUNTER — Inpatient Hospital Stay: Payer: Medicare PPO | Attending: Oncology

## 2019-02-16 ENCOUNTER — Encounter: Payer: Self-pay | Admitting: Podiatry

## 2019-02-16 VITALS — BP 115/60 | HR 68 | Temp 98.3°F | Resp 18 | Wt 182.5 lb

## 2019-02-16 DIAGNOSIS — Z95828 Presence of other vascular implants and grafts: Secondary | ICD-10-CM

## 2019-02-16 DIAGNOSIS — C787 Secondary malignant neoplasm of liver and intrahepatic bile duct: Secondary | ICD-10-CM

## 2019-02-16 DIAGNOSIS — M21372 Foot drop, left foot: Secondary | ICD-10-CM

## 2019-02-16 DIAGNOSIS — Z5112 Encounter for antineoplastic immunotherapy: Secondary | ICD-10-CM | POA: Insufficient documentation

## 2019-02-16 DIAGNOSIS — C50911 Malignant neoplasm of unspecified site of right female breast: Secondary | ICD-10-CM

## 2019-02-16 DIAGNOSIS — C50411 Malignant neoplasm of upper-outer quadrant of right female breast: Secondary | ICD-10-CM

## 2019-02-16 DIAGNOSIS — C801 Malignant (primary) neoplasm, unspecified: Secondary | ICD-10-CM

## 2019-02-16 DIAGNOSIS — Z171 Estrogen receptor negative status [ER-]: Secondary | ICD-10-CM

## 2019-02-16 LAB — COMPREHENSIVE METABOLIC PANEL
ALT: 45 U/L — ABNORMAL HIGH (ref 0–44)
AST: 34 U/L (ref 15–41)
Albumin: 3.9 g/dL (ref 3.5–5.0)
Alkaline Phosphatase: 91 U/L (ref 38–126)
Anion gap: 8 (ref 5–15)
BUN: 31 mg/dL — ABNORMAL HIGH (ref 8–23)
CO2: 20 mmol/L — ABNORMAL LOW (ref 22–32)
Calcium: 8.6 mg/dL — ABNORMAL LOW (ref 8.9–10.3)
Chloride: 115 mmol/L — ABNORMAL HIGH (ref 98–111)
Creatinine, Ser: 1.64 mg/dL — ABNORMAL HIGH (ref 0.44–1.00)
GFR calc Af Amer: 37 mL/min — ABNORMAL LOW (ref >60)
GFR calc non Af Amer: 32 mL/min — ABNORMAL LOW (ref >60)
Glucose, Bld: 61 mg/dL — ABNORMAL LOW (ref 70–99)
Potassium: 4.3 mmol/L (ref 3.5–5.1)
Sodium: 143 mmol/L (ref 135–145)
Total Bilirubin: 0.6 mg/dL (ref 0.3–1.2)
Total Protein: 6.3 g/dL — ABNORMAL LOW (ref 6.5–8.1)

## 2019-02-16 LAB — CBC WITH DIFFERENTIAL/PLATELET
Abs Immature Granulocytes: 0.02 10*3/uL (ref 0.00–0.07)
Basophils Absolute: 0 10*3/uL (ref 0.0–0.1)
Basophils Relative: 0 %
Eosinophils Absolute: 0.1 10*3/uL (ref 0.0–0.5)
Eosinophils Relative: 2 %
HCT: 31.8 % — ABNORMAL LOW (ref 36.0–46.0)
Hemoglobin: 10.8 g/dL — ABNORMAL LOW (ref 12.0–15.0)
Immature Granulocytes: 0 %
Lymphocytes Relative: 25 %
Lymphs Abs: 1.3 10*3/uL (ref 0.7–4.0)
MCH: 31.7 pg (ref 26.0–34.0)
MCHC: 34 g/dL (ref 30.0–36.0)
MCV: 93.3 fL (ref 80.0–100.0)
Monocytes Absolute: 0.5 10*3/uL (ref 0.1–1.0)
Monocytes Relative: 9 %
Neutro Abs: 3.4 10*3/uL (ref 1.7–7.7)
Neutrophils Relative %: 64 %
Platelets: 154 10*3/uL (ref 150–400)
RBC: 3.41 MIL/uL — ABNORMAL LOW (ref 3.87–5.11)
RDW: 13.3 % (ref 11.5–15.5)
WBC: 5.4 10*3/uL (ref 4.0–10.5)
nRBC: 0 % (ref 0.0–0.2)

## 2019-02-16 LAB — CEA (IN HOUSE-CHCC): CEA (CHCC-In House): 1.9 ng/mL (ref 0.00–5.00)

## 2019-02-16 MED ORDER — SODIUM CHLORIDE 0.9% FLUSH
10.0000 mL | INTRAVENOUS | Status: DC | PRN
  Administered 2019-02-16: 10 mL via INTRAVENOUS
  Filled 2019-02-16: qty 10

## 2019-02-16 MED ORDER — TRASTUZUMAB-ANNS CHEMO 420 MG IV SOLR
6.0000 mg/kg | Freq: Once | INTRAVENOUS | Status: AC
  Administered 2019-02-16: 546 mg via INTRAVENOUS
  Filled 2019-02-16: qty 26

## 2019-02-16 MED ORDER — SODIUM CHLORIDE 0.9 % IV SOLN
Freq: Once | INTRAVENOUS | Status: AC
  Filled 2019-02-16: qty 250

## 2019-02-16 MED ORDER — SODIUM CHLORIDE 0.9 % IV SOLN
420.0000 mg | Freq: Once | INTRAVENOUS | Status: AC
  Administered 2019-02-16: 420 mg via INTRAVENOUS
  Filled 2019-02-16: qty 14

## 2019-02-16 MED ORDER — HEPARIN SOD (PORK) LOCK FLUSH 100 UNIT/ML IV SOLN
500.0000 [IU] | Freq: Once | INTRAVENOUS | Status: AC | PRN
  Administered 2019-02-16: 500 [IU]
  Filled 2019-02-16: qty 5

## 2019-02-16 MED ORDER — ACETAMINOPHEN 325 MG PO TABS
ORAL_TABLET | ORAL | Status: AC
  Filled 2019-02-16: qty 2

## 2019-02-16 MED ORDER — DIPHENHYDRAMINE HCL 25 MG PO CAPS
ORAL_CAPSULE | ORAL | Status: AC
  Filled 2019-02-16: qty 1

## 2019-02-16 MED ORDER — SODIUM CHLORIDE 0.9% FLUSH
10.0000 mL | INTRAVENOUS | Status: DC | PRN
  Administered 2019-02-16: 10 mL
  Filled 2019-02-16: qty 10

## 2019-02-16 MED ORDER — DIPHENHYDRAMINE HCL 25 MG PO CAPS
25.0000 mg | ORAL_CAPSULE | Freq: Once | ORAL | Status: AC
  Administered 2019-02-16: 25 mg via ORAL

## 2019-02-16 MED ORDER — ACETAMINOPHEN 325 MG PO TABS
650.0000 mg | ORAL_TABLET | Freq: Once | ORAL | Status: AC
  Administered 2019-02-16: 650 mg via ORAL

## 2019-02-16 NOTE — Patient Instructions (Signed)
[facility] Discharge Instructions for Patients Receiving Chemotherapy  Today you received the following chemotherapy agents Trastuzumab and Perjeta  To help prevent nausea and vomiting after your treatment, we encourage you to take your nausea medication as directed.    If you develop nausea and vomiting that is not controlled by your nausea medication, call the clinic.   BELOW ARE SYMPTOMS THAT SHOULD BE REPORTED IMMEDIATELY:  *FEVER GREATER THAN 100.5 F  *CHILLS WITH OR WITHOUT FEVER  NAUSEA AND VOMITING THAT IS NOT CONTROLLED WITH YOUR NAUSEA MEDICATION  *UNUSUAL SHORTNESS OF BREATH  *UNUSUAL BRUISING OR BLEEDING  TENDERNESS IN MOUTH AND THROAT WITH OR WITHOUT PRESENCE OF ULCERS  *URINARY PROBLEMS  *BOWEL PROBLEMS  UNUSUAL RASH Items with * indicate a potential emergency and should be followed up as soon as possible.  Feel free to call the clinic should you have any questions or concerns. The clinic phone number is [phone].  Please show the CHEMO ALERT CARD at check-in to the Emergency Department and triage nurse.   

## 2019-02-16 NOTE — Patient Instructions (Signed)

## 2019-02-16 NOTE — Progress Notes (Signed)
Pt states she does not wish to stay for the 30 minute post perjeta observation today.

## 2019-02-17 ENCOUNTER — Encounter: Payer: Self-pay | Admitting: Neurology

## 2019-02-17 ENCOUNTER — Other Ambulatory Visit: Payer: Self-pay

## 2019-02-17 DIAGNOSIS — M62571 Muscle wasting and atrophy, not elsewhere classified, right ankle and foot: Secondary | ICD-10-CM | POA: Diagnosis not present

## 2019-02-17 DIAGNOSIS — M6281 Muscle weakness (generalized): Secondary | ICD-10-CM | POA: Diagnosis not present

## 2019-02-17 DIAGNOSIS — M25672 Stiffness of left ankle, not elsewhere classified: Secondary | ICD-10-CM | POA: Diagnosis not present

## 2019-02-17 DIAGNOSIS — M25674 Stiffness of right foot, not elsewhere classified: Secondary | ICD-10-CM | POA: Diagnosis not present

## 2019-02-17 DIAGNOSIS — M25671 Stiffness of right ankle, not elsewhere classified: Secondary | ICD-10-CM | POA: Diagnosis not present

## 2019-02-17 DIAGNOSIS — M62572 Muscle wasting and atrophy, not elsewhere classified, left ankle and foot: Secondary | ICD-10-CM | POA: Diagnosis not present

## 2019-02-17 DIAGNOSIS — M25675 Stiffness of left foot, not elsewhere classified: Secondary | ICD-10-CM | POA: Diagnosis not present

## 2019-02-17 DIAGNOSIS — M21372 Foot drop, left foot: Secondary | ICD-10-CM

## 2019-02-17 DIAGNOSIS — R262 Difficulty in walking, not elsewhere classified: Secondary | ICD-10-CM | POA: Diagnosis not present

## 2019-02-17 NOTE — Telephone Encounter (Signed)
I spoke with pt and she states left foot has the drop foot. Faxed referral, orders for NCV with EMG, clinicals and demographics to [facility] per response from Dr. Leigh Aurora correspondence with pt on MyChart.

## 2019-02-19 DIAGNOSIS — R262 Difficulty in walking, not elsewhere classified: Secondary | ICD-10-CM | POA: Diagnosis not present

## 2019-02-19 DIAGNOSIS — M25671 Stiffness of right ankle, not elsewhere classified: Secondary | ICD-10-CM | POA: Diagnosis not present

## 2019-02-19 DIAGNOSIS — M6281 Muscle weakness (generalized): Secondary | ICD-10-CM | POA: Diagnosis not present

## 2019-02-19 DIAGNOSIS — M62572 Muscle wasting and atrophy, not elsewhere classified, left ankle and foot: Secondary | ICD-10-CM | POA: Diagnosis not present

## 2019-02-19 DIAGNOSIS — M25675 Stiffness of left foot, not elsewhere classified: Secondary | ICD-10-CM | POA: Diagnosis not present

## 2019-02-19 DIAGNOSIS — M25674 Stiffness of right foot, not elsewhere classified: Secondary | ICD-10-CM | POA: Diagnosis not present

## 2019-02-19 DIAGNOSIS — M62571 Muscle wasting and atrophy, not elsewhere classified, right ankle and foot: Secondary | ICD-10-CM | POA: Diagnosis not present

## 2019-02-19 DIAGNOSIS — M25672 Stiffness of left ankle, not elsewhere classified: Secondary | ICD-10-CM | POA: Diagnosis not present

## 2019-02-20 ENCOUNTER — Ambulatory Visit: Payer: Self-pay

## 2019-02-22 DIAGNOSIS — M62571 Muscle wasting and atrophy, not elsewhere classified, right ankle and foot: Secondary | ICD-10-CM | POA: Diagnosis not present

## 2019-02-22 DIAGNOSIS — M25674 Stiffness of right foot, not elsewhere classified: Secondary | ICD-10-CM | POA: Diagnosis not present

## 2019-02-22 DIAGNOSIS — R262 Difficulty in walking, not elsewhere classified: Secondary | ICD-10-CM | POA: Diagnosis not present

## 2019-02-22 DIAGNOSIS — M25672 Stiffness of left ankle, not elsewhere classified: Secondary | ICD-10-CM | POA: Diagnosis not present

## 2019-02-22 DIAGNOSIS — M25671 Stiffness of right ankle, not elsewhere classified: Secondary | ICD-10-CM | POA: Diagnosis not present

## 2019-02-22 DIAGNOSIS — M6281 Muscle weakness (generalized): Secondary | ICD-10-CM | POA: Diagnosis not present

## 2019-02-22 DIAGNOSIS — M62572 Muscle wasting and atrophy, not elsewhere classified, left ankle and foot: Secondary | ICD-10-CM | POA: Diagnosis not present

## 2019-02-22 DIAGNOSIS — M25675 Stiffness of left foot, not elsewhere classified: Secondary | ICD-10-CM | POA: Diagnosis not present

## 2019-02-23 DIAGNOSIS — C50911 Malignant neoplasm of unspecified site of right female breast: Secondary | ICD-10-CM | POA: Diagnosis not present

## 2019-02-23 DIAGNOSIS — Z0001 Encounter for general adult medical examination with abnormal findings: Secondary | ICD-10-CM | POA: Diagnosis not present

## 2019-02-23 DIAGNOSIS — I10 Essential (primary) hypertension: Secondary | ICD-10-CM | POA: Diagnosis not present

## 2019-02-23 DIAGNOSIS — G629 Polyneuropathy, unspecified: Secondary | ICD-10-CM | POA: Diagnosis not present

## 2019-02-24 DIAGNOSIS — E113512 Type 2 diabetes mellitus with proliferative diabetic retinopathy with macular edema, left eye: Secondary | ICD-10-CM | POA: Diagnosis not present

## 2019-02-25 ENCOUNTER — Ambulatory Visit: Payer: Self-pay

## 2019-02-25 ENCOUNTER — Ambulatory Visit: Payer: Medicare PPO | Attending: Internal Medicine

## 2019-02-25 DIAGNOSIS — Z23 Encounter for immunization: Secondary | ICD-10-CM | POA: Insufficient documentation

## 2019-02-25 NOTE — Progress Notes (Signed)
   Covid-19 Vaccination Clinic  Name:  Darlene [name]    MRN: [id number] DOB: Aug 17, 1950  02/25/2019  Darlene [name] was observed post Covid-19 immunization for 15 minutes without incidence. She was provided with Vaccine Information Sheet and instruction to access the V-Safe system.   Darlene [name] was instructed to call 911 with any severe reactions post vaccine: [name] Difficulty breathing  . Swelling of your face and throat  . A fast heartbeat  . A bad rash all over your body  . Dizziness and weakness    Immunizations Administered    Name Date Dose VIS Date Route   Pfizer COVID-19 Vaccine 02/25/2019 10:19 AM 0.3 mL 01/01/2019 Intramuscular   Manufacturer: [place]   Lot: [id number]   [place]: [id number]

## 2019-02-26 ENCOUNTER — Encounter (HOSPITAL_COMMUNITY): Payer: Self-pay | Admitting: Cardiology

## 2019-02-26 ENCOUNTER — Ambulatory Visit (HOSPITAL_BASED_OUTPATIENT_CLINIC_OR_DEPARTMENT_OTHER)
Admission: RE | Admit: 2019-02-26 | Discharge: 2019-02-26 | Disposition: A | Discharge status: Home / Self Care | Payer: Medicare PPO | Source: Ambulatory Visit | Attending: Cardiology | Admitting: Cardiology

## 2019-02-26 ENCOUNTER — Ambulatory Visit (HOSPITAL_COMMUNITY)
Admission: RE | Admit: 2019-02-26 | Discharge: 2019-02-26 | Disposition: A | Discharge status: Home / Self Care | Payer: Medicare PPO | Source: Ambulatory Visit | Attending: Cardiology | Admitting: Cardiology

## 2019-02-26 ENCOUNTER — Other Ambulatory Visit: Payer: Self-pay

## 2019-02-26 VITALS — BP 144/68 | HR 62 | Wt 186.4 lb

## 2019-02-26 DIAGNOSIS — G4733 Obstructive sleep apnea (adult) (pediatric): Secondary | ICD-10-CM | POA: Diagnosis not present

## 2019-02-26 DIAGNOSIS — I10 Essential (primary) hypertension: Secondary | ICD-10-CM | POA: Diagnosis not present

## 2019-02-26 DIAGNOSIS — F329 Major depressive disorder, single episode, unspecified: Secondary | ICD-10-CM | POA: Insufficient documentation

## 2019-02-26 DIAGNOSIS — F419 Anxiety disorder, unspecified: Secondary | ICD-10-CM | POA: Insufficient documentation

## 2019-02-26 DIAGNOSIS — M25675 Stiffness of left foot, not elsewhere classified: Secondary | ICD-10-CM | POA: Diagnosis not present

## 2019-02-26 DIAGNOSIS — I517 Cardiomegaly: Secondary | ICD-10-CM

## 2019-02-26 DIAGNOSIS — M62572 Muscle wasting and atrophy, not elsewhere classified, left ankle and foot: Secondary | ICD-10-CM | POA: Diagnosis not present

## 2019-02-26 DIAGNOSIS — M1990 Unspecified osteoarthritis, unspecified site: Secondary | ICD-10-CM | POA: Diagnosis not present

## 2019-02-26 DIAGNOSIS — Z853 Personal history of malignant neoplasm of breast: Secondary | ICD-10-CM | POA: Insufficient documentation

## 2019-02-26 DIAGNOSIS — C50411 Malignant neoplasm of upper-outer quadrant of right female breast: Secondary | ICD-10-CM | POA: Diagnosis not present

## 2019-02-26 DIAGNOSIS — R262 Difficulty in walking, not elsewhere classified: Secondary | ICD-10-CM | POA: Diagnosis not present

## 2019-02-26 DIAGNOSIS — Z794 Long term (current) use of insulin: Secondary | ICD-10-CM | POA: Insufficient documentation

## 2019-02-26 DIAGNOSIS — M62571 Muscle wasting and atrophy, not elsewhere classified, right ankle and foot: Secondary | ICD-10-CM | POA: Diagnosis not present

## 2019-02-26 DIAGNOSIS — E669 Obesity, unspecified: Secondary | ICD-10-CM | POA: Diagnosis not present

## 2019-02-26 DIAGNOSIS — M25672 Stiffness of left ankle, not elsewhere classified: Secondary | ICD-10-CM | POA: Diagnosis not present

## 2019-02-26 DIAGNOSIS — E785 Hyperlipidemia, unspecified: Secondary | ICD-10-CM | POA: Insufficient documentation

## 2019-02-26 DIAGNOSIS — Z7901 Long term (current) use of anticoagulants: Secondary | ICD-10-CM | POA: Diagnosis not present

## 2019-02-26 DIAGNOSIS — I348 Other nonrheumatic mitral valve disorders: Secondary | ICD-10-CM

## 2019-02-26 DIAGNOSIS — E11319 Type 2 diabetes mellitus with unspecified diabetic retinopathy without macular edema: Secondary | ICD-10-CM | POA: Diagnosis not present

## 2019-02-26 DIAGNOSIS — Z8249 Family history of ischemic heart disease and other diseases of the circulatory system: Secondary | ICD-10-CM | POA: Diagnosis not present

## 2019-02-26 DIAGNOSIS — M6281 Muscle weakness (generalized): Secondary | ICD-10-CM | POA: Diagnosis not present

## 2019-02-26 DIAGNOSIS — I700 Atherosclerosis of aorta: Secondary | ICD-10-CM | POA: Insufficient documentation

## 2019-02-26 DIAGNOSIS — D649 Anemia, unspecified: Secondary | ICD-10-CM | POA: Insufficient documentation

## 2019-02-26 DIAGNOSIS — Z79899 Other long term (current) drug therapy: Secondary | ICD-10-CM | POA: Insufficient documentation

## 2019-02-26 DIAGNOSIS — K760 Fatty (change of) liver, not elsewhere classified: Secondary | ICD-10-CM | POA: Insufficient documentation

## 2019-02-26 DIAGNOSIS — R599 Enlarged lymph nodes, unspecified: Secondary | ICD-10-CM | POA: Insufficient documentation

## 2019-02-26 DIAGNOSIS — E1140 Type 2 diabetes mellitus with diabetic neuropathy, unspecified: Secondary | ICD-10-CM | POA: Diagnosis not present

## 2019-02-26 DIAGNOSIS — M25671 Stiffness of right ankle, not elsewhere classified: Secondary | ICD-10-CM | POA: Diagnosis not present

## 2019-02-26 DIAGNOSIS — M25674 Stiffness of right foot, not elsewhere classified: Secondary | ICD-10-CM | POA: Diagnosis not present

## 2019-02-26 NOTE — Progress Notes (Signed)
  Echocardiogram 2D Echocardiogram has been performed.  Jennette Dubin 02/26/2019, 2:04 PM

## 2019-02-26 NOTE — Patient Instructions (Addendum)
Your physician recommends that you schedule a follow-up appointment in: 4 months with echo and Dr Aundra Dubin  Your physician has requested that you have an echocardiogram. Echocardiography is a painless test that uses sound waves to create images of your heart. It provides your doctor with information about the size and shape of your heart and how well your heart's chambers and valves are working. This procedure takes approximately one hour. There are no restrictions for this procedure.   You were ordered for a CT of your heart.  You will get a call to schedule this appointment.   Please call office at [phone] option 2 if you have any questions or concerns.   At the [facility], you and your health needs are our priority. As part of our continuing mission to provide you with exceptional heart care, we have created designated Provider Care Teams. These Care Teams include your primary Cardiologist (physician) and Advanced Practice Providers (APPs- Physician Assistants and Nurse Practitioners) who all work together to provide you with the care you need, when you need it.   You may see any of the following providers on your designated Care Team at your next follow up: [name] Dr Glori Bickers . Dr Loralie Champagne . Darrick Grinder, NP . Lyda Jester, PA . Audry Riles, PharmD   Please be sure to bring in all your medications bottles to every appointment.

## 2019-02-28 NOTE — Progress Notes (Signed)
Patient ID: Darlene [name], female   DOB: Jun 23, 1950, 69 y.o.   MRN: [id number] Oncologist: Dr Jana Hakim   HPI: Darlene [name] is a 69 y.o.with history of R breast cancer, right axillary lymph node biopsy 07/19/2014 for a cT2 pN1, stage IIB invasive ductal carcinoma, grade 2, estrogen and progesterone receptor negative, with an MIB-1 of 90%, and HER-2 positive , OSA -> CPAP, bariatric surgery, and DM, referred to cardio-oncology clinic by Dr Jana Hakim  Completed neoadjuvant chemotherapy with carboplatin, docetaxel, trastuzumab and pertuzumab given every 3 weeks 6. Trastuzumab + capecitabine were completed in 9/17.  She had lumpectomy 12/16 and had mastectomy in 1/17.  She has completed radiation.   She was found in 11/17 to have a recurrence by PET => right retropectoral lymph node enlarged. In 2/18, she started on treatment with ado-trastuzumab emtansine, now back on Herceptin.  Echo prior to treatment showed stable EF.  Starting in 6/19, she was transitioned to [facility].   She is now off Kadcyla and back on Herceptin/Perjeta for use long-term.     Last echo showed lower EF, 50-55%, and less negative strain.  She was started on Coreg.    She returns for cardio-oncology followup.  BP is elevated but generally runs in the 888B systolic when checked at home.  No significant exertional dyspnea or chest pain.  No orthopnea/PND.  Overall doing well.   Labs (1/21): LDL 75  Echo (7/16) with EF 60-65%, grade II diastolic dysfunction.  Echo (10/16) with EF 66%, GLS -17%. Echo (1/17) with EF 60-65%, grade II diastolic dysfunction, lateral s' 10, GLS -18.8% Echo (4/17) with EF 60-65%, grade II diastolic dysfunction, lateral s' 9.5, strain poorly traced and off axis, normal RV size and systolic function.  Echo (6/17) with EF 55-60%, aortic sclerosis without stenosis, difficult images for strain, probably not accurate.  Echo (10/17) with EF 55-60%, mild LVH, moderate diastolic dysfunction, normal RV size  and systolic function.  Echo (2/18) with EF 55-60%, GLS -15% but technically difficult study Echo (6/18) with EF 60-65%, GLS -15% but technically difficult study.  Echo (9/18) with EF 55-60%, GLS -19.8%, grade II diastolic dysfunction.  Echo (1/19) with EF 55-60%, normal RV size and systolic function, GLS -16.9% (done with different machine).  Echo (4/19) with EF 55-60%, mild LVH, GLS -17% Echo (9/19) with EF 55-60%, GLS -17.1%, normal RV size and systolic function.  Echo (8/20): EF 50-55%.  Echo (11/20): EF 50-55%, GLS -12.7% (not ideal tracing), mildly decreased RV systolic function.   Echo (2/21): EF 55-60%, GLS -17.9%, grade II diastolic dysfunction, normal RV  Review of Systems: All systems reviewed and negative except as per HPI.   Past Medical History:  Diagnosis Date  . Anemia    hx of - during 1st round chemo  . Anxiety   . Arthritis    in fingers, shoulders  . Back pain   . Balance problem   . Breast cancer ([place])   . Breast cancer of upper-outer quadrant of right female breast ([place]) 07/22/2014  . Bunion, right foot   . Clumsiness   . Constipation   . Depression   . Diabetes mellitus without complication ([place])    45+ years  . Diabetic retinopathy ([place])    torn retina  . Eating disorder    binge eating  . Epistaxis 08/26/2014  . Fatty liver   . HCAP (healthcare-associated pneumonia) 12/18/2014  . Headache   . Heart murmur    never had any problems  .  History of radiation therapy 04/05/15-05/19/15   right chest wall was treated to 50.4 Gy in 28 fractions, right mastectomy scar was treated to 10 Gy in 5 fractions  . Hyperlipidemia   . Hypertension   . Joint pain   . Multiple food allergies   . Neuromuscular disorder ([place])    diabetic neuropathy  . Obesity   . Obstructive sleep apnea on CPAP    uses cpap nightly  . Ovarian cyst rupture    possible  . Pneumonia   . Pneumonia 11/2014  . Radiation 04/05/15-05/19/15   right chest wall 50.4 Gy, mastectomy scar 10 Gy   . Sleep apnea   . Thyroid nodule 06/2014    Current Outpatient Medications  Medication Sig Dispense Refill  . acetaminophen (TYLENOL) 325 MG tablet Take 650 mg by mouth 2 (two) times daily as needed for moderate pain or headache.     . Blood Glucose Monitoring Suppl (ONETOUCH VERIO FLEX SYSTEM) w/Device KIT 1 kit by Does not apply route 4 (four) times daily. Test blood sugars four times daily 1 kit 0  . buPROPion (WELLBUTRIN SR) 200 MG 12 hr tablet TAKE 1 TABLET AT 12 NOON. 30 tablet 0  . CALCIUM CITRATE-VITAMIN D PO Take 1 tablet by mouth 3 (three) times daily.     . carvedilol (COREG) 3.125 MG tablet Take 1 tablet (3.125 mg total) by mouth 2 (two) times daily. 60 tablet 3  . cholecalciferol (VITAMIN D) 1000 units tablet Take 1,000 Units by mouth daily.    . cholestyramine (QUESTRAN) 4 g packet Take 1 packet (4 g total) by mouth 2 (two) times daily as needed. 10 each 12  . Cyanocobalamin (VITAMIN B-12 PO) Take 1 tablet by mouth every 14 (fourteen) days.    . diphenhydrAMINE (BENADRYL) 25 MG tablet Take 25 mg by mouth daily as needed for allergies or sleep.     [name] glucose blood (ONETOUCH VERIO) test strip Use as instructed 100 each 1  . Insulin Glargine (LANTUS SOLOSTAR) 100 UNIT/ML Solostar Pen Inject 12 Units into the skin daily. 5 pen 1  . liraglutide (VICTOZA) 18 MG/3ML SOPN INJECT 1.8MG(0.3 MLS) INTO THE SKIN EVERY MORNING 3 pen 1  . loratadine (CLARITIN) 10 MG tablet Take 10 mg by mouth as needed for allergies.    [name] losartan (COZAAR) 100 MG tablet Take 1 tablet (100 mg total) by mouth daily. 30 tablet 1  . metFORMIN (GLUCOPHAGE) 500 MG tablet Take 1 tablet (500 mg total) by mouth daily. 30 tablet 1  . ofloxacin (OCUFLOX) 0.3 % ophthalmic solution Place 1 drop into both eyes as needed.     Glory Rosebush Delica Lancets 65K MISC 1 Package by Does not apply route daily. 100 each 0  . Pediatric Multivitamins-Iron (FLINTSTONES PLUS IRON) chewable tablet Chew 1 tablet by mouth 2 (two) times daily.     . Probiotic Product ([employer] PROBIOTIC PO) Take 1 capsule by mouth daily.     . prochlorperazine (COMPAZINE) 10 MG tablet Take 1 tablet (10 mg total) by mouth every 6 (six) hours as needed for nausea or vomiting. 30 tablet 0  . Propylene Glycol (SYSTANE BALANCE OP) Place 1-2 drops into both eyes 3 (three) times daily as needed (for dry eyes).     [name] senna-docusate (SENNA PLUS) 8.6-50 MG tablet Take 1 tablet by mouth daily as needed for moderate constipation.     . SURE COMFORT PEN NEEDLES 32G X 4 MM MISC USE WITH INJECTIONS 100 each 0  .  topiramate (TOPAMAX) 25 MG tablet Take 1 tablet (25 mg total) by mouth daily. 30 tablet 1  . vortioxetine HBr (TRINTELLIX) 20 MG TABS tablet Take 1 tablet (20 mg total) by mouth at bedtime. 30 tablet 1   No current facility-administered medications for this encounter.    Allergies  Allergen Reactions  . Nsaids Other (See Comments)    H/o gastric bypass - avoid NSAIDs  . Tape Hives, Rash and Other (See Comments)    Adhesives in bandaids      Social History   Socioeconomic History  . Marital status: Married    Spouse name: [name]  . Number of children: 0  . Years of education: Bachelor's  . Highest education level: Not on file  Occupational History  . Occupation: Pensions consultant: [employer]  Tobacco Use  . Smoking status: Never Smoker  . Smokeless tobacco: Never Used  Substance and Sexual Activity  . Alcohol use: Yes    Alcohol/week: 1.0 - 2.0 standard drinks    Types: 1 - 2 Standard drinks or equivalent per week    Comment: social  . Drug use: No  . Sexual activity: Yes    Partners: Male    Birth control/protection: Post-menopausal, Other-see comments    Comment: husband vasectomy  Other Topics Concern  . Not on file  Social History Narrative   Patient is married [name]) and lives at home with her husband.   Patient does not have any children.   Patient is working for a [employer].   Patient has a  Bachelor's degree.   Patient is right-handed.   Patient does not drink any caffeine.   Social Determinants of Health   Financial Resource Strain:   . Difficulty of Paying Living Expenses: Not on file  Food Insecurity:   . Worried About Charity fundraiser in the Last Year: Not on file  . Ran Out of Food in the Last Year: Not on file  Transportation Needs:   . Lack of Transportation (Medical): Not on file  . Lack of Transportation (Non-Medical): Not on file  Physical Activity:   . Days of Exercise per Week: Not on file  . Minutes of Exercise per Session: Not on file  Stress:   . Feeling of Stress : Not on file  Social Connections:   . Frequency of Communication with Friends and Family: Not on file  . Frequency of Social Gatherings with Friends and Family: Not on file  . Attends Religious Services: Not on file  . Active Member of Clubs or Organizations: Not on file  . Attends Archivist Meetings: Not on file  . Marital Status: Not on file  Intimate Partner Violence:   . Fear of Current or Ex-Partner: Not on file  . Emotionally Abused: Not on file  . Physically Abused: Not on file  . Sexually Abused: Not on file      Family History  Problem Relation Age of Onset  . CVA Mother   . Heart disease Mother   . Sudden death Mother   . Diabetes Father   . Heart failure Father   . Hypertension Father   . Kidney disease Father   . Obesity Father   . Hypertension Sister   . Diabetes Brother   . Breast cancer Paternal Grandmother 69  . Diabetes Paternal Uncle   . Ovarian cancer Neg Hx     Vitals:   02/26/19 1435  BP: (!) 144/68  Pulse: 62  SpO2: 98%  Weight: 84.6 kg (186 lb 6.4 oz)    PHYSICAL EXAM: General: NAD Neck: No JVD, no thyromegaly or thyroid nodule.  Lungs: Clear to auscultation bilaterally with normal respiratory effort. CV: Nondisplaced PMI.  Heart regular S1/S2, no S3/S4, no murmur.  No peripheral edema.  No carotid bruit.  Normal pedal pulses.   Abdomen: Soft, nontender, no hepatosplenomegaly, no distention.  Skin: Intact without lesions or rashes.  Neurologic: Alert and oriented x 3.  Psych: Normal affect. Extremities: No clubbing or cyanosis.  HEENT: Normal.   ASSESSMENT & PLAN:  1. Breast cancer: Metastatic.  Currently getting Herceptin and Perjeta.  Echo in 11/20 showed EF 50-55%.  Echo was done again today and reviewed, EF up to 55-60% with more negative strain (-17.9%). - Continue losartan 100 mg daily.  - Continue Coreg 3.125 mg bid.   - Repeat echo + followup in 4 months.    2. HTN: BP controlled on losartan for now.  3. OSA: Continue nightly CPAP   4. Coronary disease risk: Patient has diabetes, father with MIs. LDL in 1/21, however, was 75 without statin use.  - I will arrange for coronary artery calcium score.  If high risk, would suggest statin use.   Loralie Champagne 02/28/2019

## 2019-03-01 ENCOUNTER — Other Ambulatory Visit: Payer: Self-pay

## 2019-03-01 ENCOUNTER — Ambulatory Visit (INDEPENDENT_AMBULATORY_CARE_PROVIDER_SITE_OTHER): Payer: Medicare PPO | Admitting: Podiatry

## 2019-03-01 ENCOUNTER — Encounter: Payer: Self-pay | Admitting: Podiatry

## 2019-03-01 DIAGNOSIS — M25672 Stiffness of left ankle, not elsewhere classified: Secondary | ICD-10-CM | POA: Diagnosis not present

## 2019-03-01 DIAGNOSIS — M6281 Muscle weakness (generalized): Secondary | ICD-10-CM | POA: Diagnosis not present

## 2019-03-01 DIAGNOSIS — M62571 Muscle wasting and atrophy, not elsewhere classified, right ankle and foot: Secondary | ICD-10-CM | POA: Diagnosis not present

## 2019-03-01 DIAGNOSIS — M25675 Stiffness of left foot, not elsewhere classified: Secondary | ICD-10-CM | POA: Diagnosis not present

## 2019-03-01 DIAGNOSIS — M25674 Stiffness of right foot, not elsewhere classified: Secondary | ICD-10-CM | POA: Diagnosis not present

## 2019-03-01 DIAGNOSIS — M21371 Foot drop, right foot: Secondary | ICD-10-CM

## 2019-03-01 DIAGNOSIS — M21372 Foot drop, left foot: Secondary | ICD-10-CM | POA: Diagnosis not present

## 2019-03-01 DIAGNOSIS — M2041 Other hammer toe(s) (acquired), right foot: Secondary | ICD-10-CM

## 2019-03-01 DIAGNOSIS — R262 Difficulty in walking, not elsewhere classified: Secondary | ICD-10-CM | POA: Diagnosis not present

## 2019-03-01 DIAGNOSIS — M62572 Muscle wasting and atrophy, not elsewhere classified, left ankle and foot: Secondary | ICD-10-CM | POA: Diagnosis not present

## 2019-03-01 DIAGNOSIS — M25671 Stiffness of right ankle, not elsewhere classified: Secondary | ICD-10-CM | POA: Diagnosis not present

## 2019-03-01 DIAGNOSIS — M2011 Hallux valgus (acquired), right foot: Secondary | ICD-10-CM

## 2019-03-02 ENCOUNTER — Other Ambulatory Visit (INDEPENDENT_AMBULATORY_CARE_PROVIDER_SITE_OTHER): Payer: Self-pay | Admitting: Family Medicine

## 2019-03-02 DIAGNOSIS — Z794 Long term (current) use of insulin: Secondary | ICD-10-CM

## 2019-03-02 DIAGNOSIS — E1140 Type 2 diabetes mellitus with diabetic neuropathy, unspecified: Secondary | ICD-10-CM

## 2019-03-02 NOTE — Progress Notes (Signed)
Subjective: Darlene [name] is a 69 y.o. is seen today in office s/p right foot bunionectomy with hammertoe repair preformed on 12/09/2018.  Overall she states the surgical site is doing better.  She still gets some occasional discomfort mostly of the second toe and had some swelling but improving to the second toe.  Physical therapy is been helpful.   While she was at physical therapy the therapist noticed significant dropfoot on the left side which had changed since she been going to therapy and this appears to be getting worse.  She has a mild dropfoot on the right side and that seems to be improving but the left side has worsened over the last couple of weeks.  She does have difficulty standing up as well as balance issues.  She denies any other weakness, headaches, back pain, chest pain, shortness of breath.  Objective: General: No acute distress, AAOx3  DP/PT pulses palpable 2/4, CRT < 3 sec to all digits.  Protective sensation intact. Motor function intact.  RIGHT foot: Incision is well coapted without any evidence of dehiscence and scars are well formed.  Overall the position of the second toe and the hallux appears to be the same as previous appointments.  There is improved range of motion of the first MPJ but there is no significant discomfort upon palpation of the surgical sites.  Mild swelling still present of the second PIPJ. Absent extensor responses on the left side.  Dropfoot also present on the right side. No other open lesions or pre-ulcerative lesions.  No pain with calf compression, swelling, warmth, erythema.   Assessment and Plan:  Status post right foot surgery   -Treatment options discussed including all alternatives, risks, and complications -She has nerve conduction test scheduled for this week.  We will also follow-up and have her see neurology.  Follow-up with primary care physician as well given her other medical conditions. -From a surgical standpoint she seems to  doing well.  Continue physical therapy continue with supportive shoes that she is wearing.  Also has a toe separator.  Return in about 4 weeks (around 03/29/2019).  Trula Slade DPM

## 2019-03-03 ENCOUNTER — Encounter: Payer: Self-pay | Admitting: Neurology

## 2019-03-03 ENCOUNTER — Ambulatory Visit (INDEPENDENT_AMBULATORY_CARE_PROVIDER_SITE_OTHER): Payer: Medicare PPO | Admitting: Neurology

## 2019-03-03 ENCOUNTER — Other Ambulatory Visit: Payer: Self-pay

## 2019-03-03 VITALS — BP 126/72 | HR 85 | Resp 14 | Ht 69.0 in | Wt 180.6 lb

## 2019-03-03 DIAGNOSIS — G5730 Lesion of lateral popliteal nerve, unspecified lower limb: Secondary | ICD-10-CM

## 2019-03-03 DIAGNOSIS — E1142 Type 2 diabetes mellitus with diabetic polyneuropathy: Secondary | ICD-10-CM | POA: Diagnosis not present

## 2019-03-03 DIAGNOSIS — G620 Drug-induced polyneuropathy: Secondary | ICD-10-CM

## 2019-03-03 DIAGNOSIS — R262 Difficulty in walking, not elsewhere classified: Secondary | ICD-10-CM | POA: Diagnosis not present

## 2019-03-03 DIAGNOSIS — T451X5A Adverse effect of antineoplastic and immunosuppressive drugs, initial encounter: Secondary | ICD-10-CM

## 2019-03-03 DIAGNOSIS — M6281 Muscle weakness (generalized): Secondary | ICD-10-CM | POA: Diagnosis not present

## 2019-03-03 DIAGNOSIS — M25671 Stiffness of right ankle, not elsewhere classified: Secondary | ICD-10-CM | POA: Diagnosis not present

## 2019-03-03 DIAGNOSIS — M62571 Muscle wasting and atrophy, not elsewhere classified, right ankle and foot: Secondary | ICD-10-CM | POA: Diagnosis not present

## 2019-03-03 DIAGNOSIS — M62572 Muscle wasting and atrophy, not elsewhere classified, left ankle and foot: Secondary | ICD-10-CM | POA: Diagnosis not present

## 2019-03-03 DIAGNOSIS — M25675 Stiffness of left foot, not elsewhere classified: Secondary | ICD-10-CM | POA: Diagnosis not present

## 2019-03-03 DIAGNOSIS — M25672 Stiffness of left ankle, not elsewhere classified: Secondary | ICD-10-CM | POA: Diagnosis not present

## 2019-03-03 DIAGNOSIS — M25674 Stiffness of right foot, not elsewhere classified: Secondary | ICD-10-CM | POA: Diagnosis not present

## 2019-03-03 NOTE — Procedures (Signed)
[facility]  [address], [address]  [place], [place] [address] Tel: [phone] Fax:  [phone] Test Date:  03/03/2019  Patient: Darlene [name] DOB: Apr 24, 1950 Physician: Narda Amber, DO  Sex: Female Height: 5\' 9"  Ref Phys: Celesta Gentile, DPM  ID#: [id number] Temp: 34.0C Technician:    Patient Complaints: This is a 69 year old female with history of diabetic and chemotherapy-induced neuropathy referred for evaluation of bilateral foot drop.  NCV & EMG Findings: Extensive electrodiagnostic testing of the left lower extremity and additional studies of the right shows:  1. Bilateral sural and superficial peroneal sensory responses are absent. 2. Bilateral peroneal motor responses show reduced amplitude at the extensor digitorum brevis, conduction block and slow conduction velocity across the fibular neck.  Bilateral peroneal motor responses at the tibialis anterior shows conduction block and slowed conduction velocity across the fibular neck, with reduced amplitude seen on the left side.  Bilateral tibial motor responses show reduced amplitude. 3. Bilateral tibial H reflex studies are within normal limits. 4. Active and chronic motor axonal loss changes are seen affecting peroneal innervated muscles in bilateral lower extremities, with chronic changes affecting bilateral medial gastrocnemius and flexor digitorum longus muscles to a lesser degree.  Impression: 1. The electrophysiologic findings are consistent with a chronic, severe, sensorimotor axonal polyneuropathy affecting the lower extremities. 2. There is evidence of a superimposed common peroneal neuropathy at the fibular neck affecting bilateral lower extremities, findings are severe in degree electrically and worse on the left.   ___________________________ Narda Amber, DO    Nerve Conduction Studies Anti Sensory Summary Table   Site NR Peak (ms) Norm Peak (ms) P-T Amp (V) Norm P-T Amp  Left Sup  Peroneal Anti Sensory (Ant Lat Mall)  34C  12 cm NR  <4.6  >3  Right Sup Peroneal Anti Sensory (Ant Lat Mall)  34C  12 cm NR  <4.6  >3  Left Sural Anti Sensory (Lat Mall)  34C  Calf NR  <4.6  >3  Right Sural Anti Sensory (Lat Mall)  34C  Calf NR  <4.6  >3   Motor Summary Table   Site NR Onset (ms) Norm Onset (ms) O-P Amp (mV) Norm O-P Amp Site1 Site2 Delta-0 (ms) Dist (cm) Vel (m/s) Norm Vel (m/s)  Left Peroneal Motor (Ext Dig Brev)  34C  Ankle    5.9 <6.0 2.0 >2.5 B Fib Ankle 8.9 36.0 40 >40  B Fib    14.8  1.6  Poplt B Fib 3.0 6.0 20 >40  Poplt    17.8  0.4         Right Peroneal Motor (Ext Dig Brev)  34C  Ankle    4.1 <6.0 1.6 >2.5 B Fib Ankle 8.6 40.0 47 >40  B Fib    12.7  1.3  Poplt B Fib  10.0  >40  Poplt NR            Left Peroneal TA Motor (Tib Ant)  34C  Fib Head    2.7 <4.5 1.7 >3 Poplit Fib Head 4.6 9.0 20 >40  Poplit    7.3  0.8         Right Peroneal TA Motor (Tib Ant)  34C  Fib Head    3.6 <4.5 3.6 >3 Poplit Fib Head 2.8 10.0 36 >40  Poplit    6.4  1.1         Left Tibial Motor (Abd Hall Brev)  34C  Ankle    4.3 <6.0  2.0 >4 Knee Ankle 7.9 39.0 49 >40  Knee    12.2  1.0         Right Tibial Motor (Abd Hall Brev)  34C  Ankle    4.1 <6.0 2.4 >4 Knee Ankle 10.1 41.0 41 >40  Knee    14.2  1.8          H Reflex Studies   NR H-Lat (ms) Lat Norm (ms) L-R H-Lat (ms)  Left Tibial (Gastroc)  34C     34.83 <35 1.77  Right Tibial (Gastroc)  34C     33.06 <35 1.77   EMG   Side Muscle Ins Act Fibs Psw Fasc Number Recrt Dur Dur. Amp Amp. Poly Poly. Comment  Right AntTibialis Nml 1+ Nml Nml 1- Rapid Some 1+ Some 1+ Some 1+ N/A  Right Gastroc Nml Nml Nml Nml 1- Rapid Some 1+ Some 1+ Nml Nml N/A  Right Flex Dig Long Nml Nml Nml Nml 1- Rapid Some 1+ Some 1+ Nml Nml N/A  Right ExtHallLong Nml 2+ Nml Nml SMU Rapid All 1+ Nml Nml All 1+ N/A  Right RectFemoris Nml Nml Nml Nml Nml Nml Nml Nml Nml Nml Nml Nml N/A  Right GluteusMed Nml Nml Nml Nml Nml Nml Nml Nml Nml  Nml Nml Nml N/A  Right BicepsFemS Nml Nml Nml Nml Nml Nml Nml Nml Nml Nml Nml Nml N/A  Right Fibularis Long Nml 2+ Nml Nml 3- Rapid All 1+ All 1+ All 1+ N/A  Left Gastroc Nml Nml Nml Nml 1- Rapid Some 1+ Some 1+ Nml Nml N/A  Left Flex Dig Long Nml Nml Nml Nml 1- Rapid Some 1+ Some 1+ Nml Nml N/A  Left RectFemoris Nml Nml Nml Nml Nml Nml Nml Nml Nml Nml Nml Nml N/A  Left GluteusMed Nml Nml Nml Nml Nml Nml Nml Nml Nml Nml Nml Nml N/A  Left BicepsFemS Nml Nml Nml Nml Nml Nml Nml Nml Nml Nml Nml Nml N/A  Left ExtHallLong Nml Nml Nml Nml SMU Rapid All 1+ All 1+ All 1+ N/A  Left Fibularis Long Nml 1+ Nml Nml 3- Rapid All 1+ All 1+ All 1+ N/A  Left AntTibialis Nml 1+ Nml Nml 3- Rapid All 1+ All 1+ All 1+ N/A      Waveforms:

## 2019-03-03 NOTE — Progress Notes (Signed)
[facility] Neurology [facility] Clinic Note - Initial Visit   Date: 03/03/19  Darlene [name] MRN: [id number] DOB: 07/19/1950   Dear Dr. Wagoner:  Thank you for your kind referral of Darlene [name] for consultation of left foot drop. Although her history is well known to you, please allow us to reiterate it for the purpose of our medical record. The patient was accompanied to the clinic by self.    History of Present Illness: Darlene [name] is a 68 y.o. right-handed female with insulin-dependent diabetes mellitus, gastric bypass (2015 weight 130lb weight loss), right breast cancer s/p mastectomy, chemotherapy, and radiation (2016) with liver metastasis in 2017, and hypertension presenting for evaluation of bilateral foot drop.   She has a long history of diabetic neuropathy since around 2010 manifesting with numbness in the feet and lower legs.  She denies painful paresthesias.  She does have imbalance and walks unassisted, although a cane has been recommended.   She is avid bird watcher and takes a hiking stick when she is walking.  No falls.  She lives in a one-level home.  Initially, her diabetes was poorly-controlled with HbA1c ranging in 10-12, however, after her gastric bypass in 2015, she has markedly improved diabetes management.  Her last HbA1c is 5.5 on insulin and metformin.  She lost 130lb with her surgery.  She takes daily multievitamin and B12 injection every 2 weeks. B12 level from August was normal.  Her neuropathy did get worse in 2017 when she received chemotherapy for breast cancer (carboplatin, docetaxel, trastuzumab and pertuzumab given every 3 weeks 6. Trastuzumab + capecitabine). No history of heavy alcohol use.    She developed left foot drop in 2019 which improved with physical therapy.  Further testing was not done.  She underwent right bunionectomy on 12/09/2018.  She was immobilized in a boot for several weeks and did not notice any weakness.   Starting in January 2021, she was cleared to start to wear sneakers and she felt that both feet were weak.  She went to physical therapy and noticed that she has bilateral foot drop, with the left foot asymmetrically weaker.  She denies low back pain or radicular pain.    Out-side paper records, electronic medical record, and images have been reviewed where available and summarized as:  Lab Results  Component Value Date   HGBA1C 5.5 01/25/2019   Lab Results  Component Value Date   VITAMINB12 1,039 08/27/2018   Lab Results  Component Value Date   TSH 1.54 02/07/2017   No results found for: ESRSEDRATE, POCTSEDRATE  Past Medical History:  Diagnosis Date  . Anemia    hx of - during 1st round chemo  . Anxiety   . Arthritis    in fingers, shoulders  . Back pain   . Balance problem   . Breast cancer (HCC)   . Breast cancer of upper-outer quadrant of right female breast (HCC) 07/22/2014  . Bunion, right foot   . Clumsiness   . Constipation   . Depression   . Diabetes mellitus without complication (HCC)    20+ years  . Diabetic retinopathy (HCC)    torn retina  . Eating disorder    binge eating  . Epistaxis 08/26/2014  . Fatty liver   . HCAP (healthcare-associated pneumonia) 12/18/2014  . Headache   . Heart murmur    never had any problems  . History of radiation therapy 04/05/15-05/19/15   right chest wall was treated to 50.4 Gy   in 28 fractions, right mastectomy scar was treated to 10 Gy in 5 fractions  . Hyperlipidemia   . Hypertension   . Joint pain   . Multiple food allergies   . Neuromuscular disorder (HCC)    diabetic neuropathy  . Obesity   . Obstructive sleep apnea on CPAP    uses cpap nightly  . Ovarian cyst rupture    possible  . Pneumonia   . Pneumonia 11/2014  . Radiation 04/05/15-05/19/15   right chest wall 50.4 Gy, mastectomy scar 10 Gy  . Sleep apnea   . Thyroid nodule 06/2014    Past Surgical History:  Procedure Laterality Date  . APPENDECTOMY    .  BIOPSY THYROID Bilateral 06/2014   2 nodules - Benign   . BREAST LUMPECTOMY WITH RADIOACTIVE SEED AND SENTINEL LYMPH NODE BIOPSY Right 12/27/2014   Procedure: BREAST LUMPECTOMY WITH RADIOACTIVE SEED AND SENTINEL LYMPH NODE BIOPSY;  Surgeon: Faera Byerly, MD;  Location: [facility];  Service: General;  Laterality: Right;  . BREAST REDUCTION SURGERY Bilateral 01/06/2015   Procedure: BILATERAL BREAST REDUCTION, RIGHT ONCOPLASTIC RECONSTRUCTION),LEFT BREAST REDUCTION FOR SYMMETRY;  Surgeon: Brinda Thimmappa, MD;  Location: [facility];  Service: Plastics;  Laterality: Bilateral;  . BREATH TEK H PYLORI N/A 09/15/2012   Procedure: BREATH TEK H PYLORI;  Surgeon: Matthew B Martin, MD;  Location: [facility];  Service: General;  Laterality: N/A;  . BUNIONECTOMY Left 12/2013  . CARPAL TUNNEL RELEASE Right   . CARPAL TUNNEL RELEASE Left   . CATARACT EXTRACTION     6/19  . COLONOSCOPY    . DUPUYTREN CONTRACTURE RELEASE    . GASTRIC ROUX-EN-Y N/A 11/02/2012   Procedure: LAPAROSCOPIC ROUX-EN-Y GASTRIC;  Surgeon: Matthew B Martin, MD;  Location: [facility];  Service: General;  Laterality: N/A;  . IR GENERIC HISTORICAL  03/18/2016   IR US GUIDE VASC ACCESS LEFT 03/18/2016 Adam Henn, MD [facility]  . IR GENERIC HISTORICAL  03/18/2016   IR FLUORO GUIDE CV LINE LEFT 03/18/2016 Adam Henn, MD [facility]  . LAPAROSCOPIC APPENDECTOMY N/A 07/22/2015   Procedure: APPENDECTOMY LAPAROSCOPIC;  Surgeon: Steven Gross, MD;  Location: [facility];  Service: General;  Laterality: N/A;  . MASTECTOMY Right 02/15/2015   modified  . MODIFIED MASTECTOMY Right 02/15/2015   Procedure: RIGHT MODIFIED RADICAL MASTECTOMY;  Surgeon: Faera Byerly, MD;  Location: [facility];  Service: General;  Laterality: Right;  . PORT-A-CATH REMOVAL Left 10/10/2015   Procedure: PORT REMOVAL;  Surgeon: Faera Byerly, MD;  Location: [facility];  Service: General;  Laterality: Left;  . PORTACATH PLACEMENT Left 08/02/2014   Procedure: INSERTION PORT-A-CATH;   Surgeon: Faera Byerly, MD;  Location: [facility];  Service: General;  Laterality: Left;  . PORTACATH PLACEMENT N/A 11/05/2016   Procedure: INSERTION PORT-A-CATH;  Surgeon: Byerly, Faera, MD;  Location: [facility];  Service: General;  Laterality: N/A;  . RETINAL LASER PROCEDURE     retina tear on left eye  . ROTATOR CUFF REPAIR Right 2005  . SCAR REVISION Right 12/08/2015   Procedure: COMPLEX REPAIR RIGHT CHEST 10-15CM;  Surgeon: Brinda Thimmappa, MD;  Location: [facility];  Service: Plastics;  Laterality: Right;  . TOE SURGERY  1970s   bone spur  . TRIGGER FINGER RELEASE     x3     Medications:  Outpatient Encounter Medications as of 03/03/2019  Medication Sig  . acetaminophen (TYLENOL) 325 MG tablet Take 650 mg by mouth 2 (two) times daily as needed for   moderate pain or headache.   . Blood Glucose Monitoring Suppl (ONETOUCH VERIO FLEX SYSTEM) w/Device KIT 1 kit by Does not apply route 4 (four) times daily. Test blood sugars four times daily  . buPROPion (WELLBUTRIN SR) 200 MG 12 hr tablet TAKE 1 TABLET AT 12 NOON.  [name] CALCIUM CITRATE-VITAMIN D PO Take 1 tablet by mouth 3 (three) times daily.   . carvedilol (COREG) 3.125 MG tablet Take 1 tablet (3.125 mg total) by mouth 2 (two) times daily.  . cholecalciferol (VITAMIN D) 1000 units tablet Take 1,000 Units by mouth daily.  . cholestyramine (QUESTRAN) 4 g packet Take 1 packet (4 g total) by mouth 2 (two) times daily as needed.  . Cyanocobalamin (VITAMIN B-12 PO) Take 1 tablet by mouth every 14 (fourteen) days.  . diphenhydrAMINE (BENADRYL) 25 MG tablet Take 25 mg by mouth daily as needed for allergies or sleep.   [name] glucose blood (ONETOUCH VERIO) test strip Use as instructed  . Insulin Glargine (LANTUS SOLOSTAR) 100 UNIT/ML Solostar Pen Inject 12 Units into the skin daily.  [name] liraglutide (VICTOZA) 18 MG/3ML SOPN INJECT 1.8MG(0.3 MLS) INTO THE SKIN EVERY MORNING  . loratadine (CLARITIN) 10 MG tablet Take 10 mg by  mouth as needed for allergies.  [name] losartan (COZAAR) 100 MG tablet Take 1 tablet (100 mg total) by mouth daily.  . metFORMIN (GLUCOPHAGE) 500 MG tablet Take 1 tablet (500 mg total) by mouth daily.  Glory Rosebush Delica Lancets 00Q MISC 1 Package by Does not apply route daily.  . Pediatric Multivitamins-Iron (FLINTSTONES PLUS IRON) chewable tablet Chew 1 tablet by mouth 2 (two) times daily.  . Probiotic Product ([employer] PROBIOTIC PO) Take 1 capsule by mouth daily.   . prochlorperazine (COMPAZINE) 10 MG tablet Take 1 tablet (10 mg total) by mouth every 6 (six) hours as needed for nausea or vomiting.  [name] Propylene Glycol (SYSTANE BALANCE OP) Place 1-2 drops into both eyes 3 (three) times daily as needed (for dry eyes).   [name] senna-docusate (SENNA PLUS) 8.6-50 MG tablet Take 1 tablet by mouth daily as needed for moderate constipation.   . SURE COMFORT PEN NEEDLES 32G X 4 MM MISC USE WITH INJECTIONS  . topiramate (TOPAMAX) 25 MG tablet Take 1 tablet (25 mg total) by mouth daily.  [name] vortioxetine HBr (TRINTELLIX) 20 MG TABS tablet Take 1 tablet (20 mg total) by mouth at bedtime.  . [DISCONTINUED] ofloxacin (OCUFLOX) 0.3 % ophthalmic solution Place 1 drop into both eyes as needed.    No facility-administered encounter medications on file as of 03/03/2019.    Allergies:  Allergies  Allergen Reactions  . Nsaids Other (See Comments)    H/o gastric bypass - avoid NSAIDs  . Tape Hives, Rash and Other (See Comments)    Adhesives in bandaids    Family History: Family History  Problem Relation Age of Onset  . CVA Mother   . Heart disease Mother   . Sudden death Mother   . Diabetes Father   . Heart failure Father   . Hypertension Father   . Kidney disease Father   . Obesity Father   . Hypertension Sister   . Diabetes Brother   . Breast cancer Paternal Grandmother 63  . Diabetes Paternal Uncle   . Ovarian cancer Neg Hx     Social History: Social History   Tobacco Use  . Smoking status: Never Smoker   . Smokeless tobacco: Never Used  Substance Use Topics  . Alcohol use: Yes  Alcohol/week: 1.0 - 2.0 standard drinks    Types: 1 - 2 Standard drinks or equivalent per week    Comment: social  . Drug use: No   Social History   Social History Narrative   Patient is married Health and safety inspector) and lives at home with her husband.   Patient does not have any children.   Patient is working for a [employer].   Patient has a Bachelor's degree.   Patient is right-handed.   Patient does not drink any caffeine.    Vital Signs:  BP 126/72 (BP Location: Left Arm, Patient Position: Sitting)   Pulse 85   Resp 14   Ht 5' 9" (1.753 m)   Wt 180 lb 9.6 oz (81.9 kg)   LMP 10/22/2007   SpO2 98%   BMI 26.67 kg/m    Neurological Exam: MENTAL STATUS including orientation to time, place, person, recent and remote memory, attention span and concentration, language, and fund of knowledge is normal.  Speech is not dysarthric.  CRANIAL NERVES: II:  No visual field defects.   III-IV-VI: Pupils equal round and reactive to light.  Normal conjugate, extra-ocular eye movements in all directions of gaze.  No nystagmus.  No ptosis.   VII:  Normal facial symmetry and movements.    XI:  Normal shoulder shrug and head rotation.    MOTOR:  Bilateral TA and intrinsic foot atrophy, worse on the left.  No fasciculations or abnormal movements.  No pronator drift.   Upper Extremity:  Right  Left  Deltoid  5/5   5/5   Biceps  5/5   5/5   Triceps  5/5   5/5   Infraspinatus 5/5  5/5  Medial pectoralis 5/5  5/5  Wrist extensors  5/5   5/5   Wrist flexors  5/5   5/5   Finger extensors  5/5   5/5   Finger flexors  5/5   5/5   Dorsal interossei  5/5   5/5   Abductor pollicis  5/5   5/5   Tone (Ashworth scale)  0  0   Lower Extremity:  Right  Left  Hip flexors  5/5   5/5   Hip extensors  5/5   5/5   Adductor 5/5  5/5  Abductor 5/5  5/5  Knee flexors  5/5   5/5   Knee extensors  5/5   5/5   Dorsiflexors  3+/5    1/5   Plantarflexors  5/5   5/5   Inversion 5/5  5/5  Eversion 4/5  2/5  Toe extensors  3+/5   1/5   Toe flexors  5/5   5/5   Tone (Ashworth scale)  0  0   MSRs:  Right        Left                  brachioradialis 2+  2+  biceps 2+  2+  triceps 2+  2+  patellar 2+  2+  ankle jerk 0  0  Hoffman no  no  plantar response down  down   SENSORY: Absent vibration distal to ankles bilaterally.  Temperature and pinprick is reduced in a gradient manner distal to the lower legs.  Romberg testing is positive.  COORDINATION/GAIT: Normal finger-to- nose-finger. Finger tapping is intact.  There is bilateral high steppage gait, worse on the left unsteady and unassisted.  She can easily stand on toes, unable to stand on heels.   Diagnostic testing:  NCS/EMG of the legs 03/03/2019: 1. The electrophysiologic findings are consistent with a chronic, severe, sensorimotor axonal polyneuropathy affecting the lower extremities. 2. There is evidence of a superimposed common peroneal neuropathy at the fibular neck affecting bilateral lower extremities, findings are severe in degree electrically and worse on the left.  IMPRESSION: 1.  Bilateral foot drop secondary to bilateral peroneal neuropathy at the fibular neck.  Findings show conduction block and conduction velocity slowing, which is worse on the left.  Upon further discussion with patient, she does habitually cross her legs, and I advised her to avoid doing so.  I suspect that with her significant weight loss, there has been loss of protective fat padding of the lateral knee leaving her peroneal nerve susceptible to compression.  Although diabetes can manifest with peroneal neuropathy, her diabetes is very well controlled and thus, seems less likely.  - Start physical therapy for bilateral foot strengthening  - Referral to [facility] for left ankle-foot orthotic.  Right foot does have some power with dorsiflexion so will hold off on brace for this leg.  -   Avoid crossing leg avoid any compressive pressure to the lateral knee and proximal leg  2. Neuropathy due to diabetes and chemotherapy. Longstanding manifesting with numbness and sensory ataxia  - Patient educated on daily foot inspection, fall prevention, and safety precautions around the home.  - Start using shower bench/chair  - Strongly encouraged to use a cane when walking at all times, especially on uneven ground  Return to clinic in 2 months  Greater than 60 minutes was spent today reviewing patient's chart, history, physical exam, imaging studies, education and counseling, medication management, and documentation.  Thank you for allowing me to participate in patient's care.  If I can answer any additional questions, I would be pleased to do so.    Sincerely,    Kadyn Chovan K. Posey Pronto, DO

## 2019-03-03 NOTE — Patient Instructions (Addendum)
Referral to [facility] for left ankle foot orthotic  Start using a shower bench  Always use a cane when walking, especially take caution on uneven ground  Avoid crossing your legs  Continue physical therapy  Return to clinic in 2 months

## 2019-03-03 NOTE — Progress Notes (Signed)
Thanks so much! I spoke the physical therapist she has been working with and will have her continue. Also we have a pedorthotist that can make the brace if she wishes.   I appreciate your help!  Matt

## 2019-03-05 ENCOUNTER — Other Ambulatory Visit: Payer: Medicare PPO | Admitting: Orthotics

## 2019-03-05 ENCOUNTER — Other Ambulatory Visit: Payer: Self-pay

## 2019-03-05 DIAGNOSIS — M62572 Muscle wasting and atrophy, not elsewhere classified, left ankle and foot: Secondary | ICD-10-CM | POA: Diagnosis not present

## 2019-03-05 DIAGNOSIS — M25672 Stiffness of left ankle, not elsewhere classified: Secondary | ICD-10-CM | POA: Diagnosis not present

## 2019-03-05 DIAGNOSIS — M25675 Stiffness of left foot, not elsewhere classified: Secondary | ICD-10-CM | POA: Diagnosis not present

## 2019-03-05 DIAGNOSIS — M25674 Stiffness of right foot, not elsewhere classified: Secondary | ICD-10-CM | POA: Diagnosis not present

## 2019-03-05 DIAGNOSIS — R262 Difficulty in walking, not elsewhere classified: Secondary | ICD-10-CM | POA: Diagnosis not present

## 2019-03-05 DIAGNOSIS — M6281 Muscle weakness (generalized): Secondary | ICD-10-CM | POA: Diagnosis not present

## 2019-03-05 DIAGNOSIS — M25671 Stiffness of right ankle, not elsewhere classified: Secondary | ICD-10-CM | POA: Diagnosis not present

## 2019-03-05 DIAGNOSIS — M62571 Muscle wasting and atrophy, not elsewhere classified, right ankle and foot: Secondary | ICD-10-CM | POA: Diagnosis not present

## 2019-03-07 ENCOUNTER — Encounter (INDEPENDENT_AMBULATORY_CARE_PROVIDER_SITE_OTHER): Payer: Self-pay | Admitting: Family Medicine

## 2019-03-08 ENCOUNTER — Ambulatory Visit (INDEPENDENT_AMBULATORY_CARE_PROVIDER_SITE_OTHER): Payer: Medicare PPO | Admitting: Family Medicine

## 2019-03-08 ENCOUNTER — Other Ambulatory Visit: Payer: Self-pay

## 2019-03-08 ENCOUNTER — Encounter (INDEPENDENT_AMBULATORY_CARE_PROVIDER_SITE_OTHER): Payer: Self-pay | Admitting: Family Medicine

## 2019-03-08 VITALS — BP 115/68 | HR 57 | Temp 97.7°F | Ht 69.0 in | Wt 177.0 lb

## 2019-03-08 DIAGNOSIS — I10 Essential (primary) hypertension: Secondary | ICD-10-CM

## 2019-03-08 DIAGNOSIS — M25674 Stiffness of right foot, not elsewhere classified: Secondary | ICD-10-CM | POA: Diagnosis not present

## 2019-03-08 DIAGNOSIS — Z794 Long term (current) use of insulin: Secondary | ICD-10-CM | POA: Diagnosis not present

## 2019-03-08 DIAGNOSIS — F3289 Other specified depressive episodes: Secondary | ICD-10-CM | POA: Diagnosis not present

## 2019-03-08 DIAGNOSIS — E119 Type 2 diabetes mellitus without complications: Secondary | ICD-10-CM | POA: Diagnosis not present

## 2019-03-08 DIAGNOSIS — Z6830 Body mass index (BMI) 30.0-30.9, adult: Secondary | ICD-10-CM | POA: Diagnosis not present

## 2019-03-08 DIAGNOSIS — M25671 Stiffness of right ankle, not elsewhere classified: Secondary | ICD-10-CM | POA: Diagnosis not present

## 2019-03-08 DIAGNOSIS — M25675 Stiffness of left foot, not elsewhere classified: Secondary | ICD-10-CM | POA: Diagnosis not present

## 2019-03-08 DIAGNOSIS — M62571 Muscle wasting and atrophy, not elsewhere classified, right ankle and foot: Secondary | ICD-10-CM | POA: Diagnosis not present

## 2019-03-08 DIAGNOSIS — E669 Obesity, unspecified: Secondary | ICD-10-CM | POA: Diagnosis not present

## 2019-03-08 DIAGNOSIS — M62572 Muscle wasting and atrophy, not elsewhere classified, left ankle and foot: Secondary | ICD-10-CM | POA: Diagnosis not present

## 2019-03-08 DIAGNOSIS — M6281 Muscle weakness (generalized): Secondary | ICD-10-CM | POA: Diagnosis not present

## 2019-03-08 DIAGNOSIS — M25672 Stiffness of left ankle, not elsewhere classified: Secondary | ICD-10-CM | POA: Diagnosis not present

## 2019-03-08 DIAGNOSIS — R262 Difficulty in walking, not elsewhere classified: Secondary | ICD-10-CM | POA: Diagnosis not present

## 2019-03-08 MED ORDER — ACCU-CHEK GUIDE VI STRP: 1.0000 | ORAL_STRIP | Freq: Four times a day (QID) | 1 refills | Status: DC

## 2019-03-08 MED ORDER — SURE COMFORT PEN NEEDLES 32G X 4 MM MISC: 0 refills | Status: DC

## 2019-03-08 MED ORDER — ACCU-CHEK GUIDE W/DEVICE KIT: 1.0000 | PACK | Freq: Four times a day (QID) | 0 refills | Status: DC

## 2019-03-08 MED ORDER — VORTIOXETINE HBR 20 MG PO TABS: 20.0000 mg | ORAL_TABLET | Freq: Every day | ORAL | 0 refills | Status: DC

## 2019-03-08 MED ORDER — ONETOUCH DELICA LANCETS 33G MISC: 1.0000 | Freq: Every day | 0 refills | Status: DC

## 2019-03-08 MED ORDER — LANTUS SOLOSTAR 100 UNIT/ML ~~LOC~~ SOPN: 12.0000 [IU] | PEN_INJECTOR | Freq: Every day | SUBCUTANEOUS | 0 refills | Status: DC

## 2019-03-08 MED ORDER — VICTOZA 18 MG/3ML ~~LOC~~ SOPN: PEN_INJECTOR | SUBCUTANEOUS | 0 refills | Status: DC

## 2019-03-08 MED ORDER — ACCU-CHEK SOFTCLIX LANCETS MISC: 1.0000 | Freq: Four times a day (QID) | 1 refills | Status: DC

## 2019-03-08 MED ORDER — TOPIRAMATE 25 MG PO TABS: 25.0000 mg | ORAL_TABLET | Freq: Every day | ORAL | 0 refills | Status: DC

## 2019-03-08 MED ORDER — METFORMIN HCL 500 MG PO TABS: 500.0000 mg | ORAL_TABLET | Freq: Every day | ORAL | 0 refills | Status: DC

## 2019-03-08 MED ORDER — LOSARTAN POTASSIUM 100 MG PO TABS: 100.0000 mg | ORAL_TABLET | Freq: Every day | ORAL | 0 refills | Status: DC

## 2019-03-08 MED ORDER — BUPROPION HCL ER (SR) 200 MG PO TB12: ORAL_TABLET | ORAL | 0 refills | Status: DC

## 2019-03-08 MED ORDER — ONETOUCH VERIO VI STRP: ORAL_STRIP | 1 refills | Status: DC

## 2019-03-08 NOTE — Progress Notes (Signed)
Chief Complaint:   OBESITY Darlene [name] is here to discuss her progress with her obesity treatment plan along with follow-up of her obesity related diagnoses. Darlene [name] is keeping a food journal and adhering to recommended goals of 1300-1500 calories and 75+ grams of protein and states she is following her eating plan approximately 75% of the time. Darlene [name] states she is exercising 0 minutes 0 times per week.  Today's visit was #: 2 Starting weight: 225 lbs Starting date: 08/09/2016 Today's weight: 177 lbs  Today's date: 03/08/2019 Total lbs lost to date: 48 Total lbs lost since last in-office visit: 1  Interim History: Darlene [name] has done well maintaining her weight. She isn't able to exercise as much due to having peripheral neuropathy and increased falls recently. She is working on getting a brace to help with her foot drop.  Subjective:   Other depression - with emotional eating. Darlene [name] is struggling with emotional eating and using food for comfort to the extent that it is negatively impacting her health. She has been working on behavior modification techniques to help reduce her emotional eating and has been somewhat successful. She shows no sign of suicidal or homicidal ideations. Darlene [name] feels she is doing well with her current medications. No signs of serotonin syndrome.  Type 2 diabetes mellitus without complication, with long-term current use of insulin (Darlene [name]). Darlene [name] is doing well with diet and glucose control. She denies hypoglycemia.  Lab Results  Component Value Date   HGBA1C 5.5 01/25/2019   HGBA1C 5.4 08/27/2018   HGBA1C 5.8 (H) 09/03/2017   Lab Results  Component Value Date   LDLCALC 75 01/25/2019   CREATININE 1.64 (H) 02/16/2019   Lab Results  Component Value Date   INSULIN 7.6 01/25/2019   INSULIN 15.3 03/11/2017   INSULIN 18.3 08/09/2016   Essential hypertension. Blood pressure is well controlled on medications, weight loss and diet.  BP Readings  from Last 3 Encounters:  03/08/19 115/68  03/03/19 126/72  02/26/19 (!) 144/68   Lab Results  Component Value Date   CREATININE 1.64 (H) 02/16/2019   CREATININE 1.64 (H) 01/25/2019   CREATININE 1.48 (H) 01/19/2019   Assessment/Plan:   Other depression - with emotional eating. Behavior modification techniques were discussed today to help Maraya deal with her emotional/non-hunger eating behaviors.  Orders and follow up as documented in patient record. She was given a refill on her topiramate (TOPAMAX) 25 MG tablet, vortioxetine HBr (TRINTELLIX) 20 MG TABS tablet, and buPROPion (WELLBUTRIN SR) 200 MG 12 hr tablet.  Type 2 diabetes mellitus without complication, with long-term current use of insulin (Darlene [name]). Good blood sugar control is important to decrease the likelihood of diabetic complications such as nephropathy, neuropathy, limb loss, blindness, coronary artery disease, and death. Intensive lifestyle modification including diet, exercise and weight loss are the first line of treatment for diabetes. She was given refills on her Insulin Glargine (LANTUS SOLOSTAR) 100 UNIT/ML Solostar Pen, liraglutide (VICTOZA) 18 MG/3ML SOPN, metFORMIN (GLUCOPHAGE) 500 MG tablet, OneTouch Delica Lancets 54Y MISC, glucose blood (ONETOUCH VERIO) test strips.   Essential hypertension. Darlene [name] is working on healthy weight loss and exercise to improve blood pressure control. We will watch for signs of hypotension as she continues her lifestyle modifications. She was given a refill on her losartan (COZAAR) 100 MG tablet.  Class 1 obesity with serious comorbidity and body mass index (BMI) of 30.0 to 30.9 in adult, unspecified obesity type - Starting BMI greater then 30.  Darlene [name] is currently  in the action stage of change. As such, her goal is to continue with weight loss efforts. She has agreed to keeping a food journal and adhering to recommended goals of 1500 calories and 75+ grams of protein.   Exercise goals:  Older adults should follow the adult guidelines. When older adults cannot meet the adult guidelines, they should be as physically active as their abilities and conditions will allow.   Behavioral modification strategies: increasing lean protein intake.  Darlene [name] has agreed to follow-up with our clinic in 6 weeks. She was informed of the importance of frequent follow-up visits to maximize her success with intensive lifestyle modifications for her multiple health conditions.   Objective:   Blood pressure 115/68, pulse (!) 57, temperature 97.7 F (36.5 C), temperature source Oral, height 5\' 9"  (1.753 m), weight 177 lb (80.3 kg), last menstrual period 10/22/2007, SpO2 99 %. Body mass index is 26.14 kg/m.  General: Cooperative, alert, well developed, in no acute distress. HEENT: Conjunctivae and lids unremarkable. Cardiovascular: Regular rhythm.  Lungs: Normal work of breathing. Neurologic: No focal deficits.   Lab Results  Component Value Date   CREATININE 1.64 (H) 02/16/2019   BUN 31 (H) 02/16/2019   NA 143 02/16/2019   K 4.3 02/16/2019   CL 115 (H) 02/16/2019   CO2 20 (L) 02/16/2019   Lab Results  Component Value Date   ALT 45 (H) 02/16/2019   AST 34 02/16/2019   ALKPHOS 91 02/16/2019   BILITOT 0.6 02/16/2019   Lab Results  Component Value Date   HGBA1C 5.5 01/25/2019   HGBA1C 5.4 08/27/2018   HGBA1C 5.8 (H) 09/03/2017   HGBA1C 6.1 (H) 03/11/2017   HGBA1C 8.2 (H) 11/05/2016   Lab Results  Component Value Date   INSULIN 7.6 01/25/2019   INSULIN 15.3 03/11/2017   INSULIN 18.3 08/09/2016   Lab Results  Component Value Date   TSH 1.54 02/07/2017   Lab Results  Component Value Date   CHOL 154 01/25/2019   HDL 62 01/25/2019   LDLCALC 75 01/25/2019   TRIG 90 01/25/2019   CHOLHDL 2.9 02/18/2013   Lab Results  Component Value Date   WBC 5.4 02/16/2019   HGB 10.8 (L) 02/16/2019   HCT 31.8 (L) 02/16/2019   MCV 93.3 02/16/2019   PLT 154 02/16/2019   Lab Results   Component Value Date   IRON 58 02/18/2013   TIBC 305 02/18/2013   FERRITIN 133 10/31/2014   Obesity Behavioral Intervention Documentation for Insurance:   Approximately 15 minutes were spent on the discussion below.  ASK: We discussed the diagnosis of obesity with [name] today and Ophia agreed to give [name] permission to discuss obesity behavioral modification therapy today.  ASSESS: Bret has the diagnosis of obesity and her BMI today is 26.2. Cherish is in the action stage of change.   ADVISE: Rabecca was educated on the multiple health risks of obesity as well as the benefit of weight loss to improve her health. She was advised of the need for long term treatment and the importance of lifestyle modifications to improve her current health and to decrease her risk of future health problems.  AGREE: Multiple dietary modification options and treatment options were discussed and Roselee agreed to follow the recommendations documented in the above note.  ARRANGE: Fenix was educated on the importance of frequent visits to treat obesity as outlined per CMS and USPSTF guidelines and agreed to schedule her next follow up appointment today.  Attestation Statements:  Reviewed by clinician on day of visit: allergies, medications, problem list, medical history, surgical history, family history, social history, and previous encounter notes.  I, Michaelene Song, am acting as Location manager for Dennard Nip, MD   I have reviewed the above documentation for accuracy and completeness, and I agree with the above. -  Dennard Nip, MD

## 2019-03-12 DIAGNOSIS — M25672 Stiffness of left ankle, not elsewhere classified: Secondary | ICD-10-CM | POA: Diagnosis not present

## 2019-03-12 DIAGNOSIS — R262 Difficulty in walking, not elsewhere classified: Secondary | ICD-10-CM | POA: Diagnosis not present

## 2019-03-12 DIAGNOSIS — M25671 Stiffness of right ankle, not elsewhere classified: Secondary | ICD-10-CM | POA: Diagnosis not present

## 2019-03-12 DIAGNOSIS — M62571 Muscle wasting and atrophy, not elsewhere classified, right ankle and foot: Secondary | ICD-10-CM | POA: Diagnosis not present

## 2019-03-12 DIAGNOSIS — M25675 Stiffness of left foot, not elsewhere classified: Secondary | ICD-10-CM | POA: Diagnosis not present

## 2019-03-12 DIAGNOSIS — M62572 Muscle wasting and atrophy, not elsewhere classified, left ankle and foot: Secondary | ICD-10-CM | POA: Diagnosis not present

## 2019-03-12 DIAGNOSIS — M6281 Muscle weakness (generalized): Secondary | ICD-10-CM | POA: Diagnosis not present

## 2019-03-12 DIAGNOSIS — M25674 Stiffness of right foot, not elsewhere classified: Secondary | ICD-10-CM | POA: Diagnosis not present

## 2019-03-16 ENCOUNTER — Inpatient Hospital Stay: Payer: Medicare PPO

## 2019-03-16 ENCOUNTER — Other Ambulatory Visit: Payer: Self-pay

## 2019-03-16 ENCOUNTER — Inpatient Hospital Stay: Payer: Medicare PPO | Attending: Oncology

## 2019-03-16 VITALS — BP 133/60 | HR 71 | Temp 98.7°F | Resp 18

## 2019-03-16 DIAGNOSIS — C50411 Malignant neoplasm of upper-outer quadrant of right female breast: Secondary | ICD-10-CM

## 2019-03-16 DIAGNOSIS — Z95828 Presence of other vascular implants and grafts: Secondary | ICD-10-CM

## 2019-03-16 DIAGNOSIS — C787 Secondary malignant neoplasm of liver and intrahepatic bile duct: Secondary | ICD-10-CM

## 2019-03-16 DIAGNOSIS — Z5112 Encounter for antineoplastic immunotherapy: Secondary | ICD-10-CM | POA: Insufficient documentation

## 2019-03-16 DIAGNOSIS — C801 Malignant (primary) neoplasm, unspecified: Secondary | ICD-10-CM

## 2019-03-16 DIAGNOSIS — Z171 Estrogen receptor negative status [ER-]: Secondary | ICD-10-CM

## 2019-03-16 DIAGNOSIS — C50911 Malignant neoplasm of unspecified site of right female breast: Secondary | ICD-10-CM

## 2019-03-16 LAB — COMPREHENSIVE METABOLIC PANEL
ALT: 47 U/L — ABNORMAL HIGH (ref 0–44)
AST: 31 U/L (ref 15–41)
Albumin: 3.8 g/dL (ref 3.5–5.0)
Alkaline Phosphatase: 99 U/L (ref 38–126)
Anion gap: 8 (ref 5–15)
BUN: 38 mg/dL — ABNORMAL HIGH (ref 8–23)
CO2: 22 mmol/L (ref 22–32)
Calcium: 8.7 mg/dL — ABNORMAL LOW (ref 8.9–10.3)
Chloride: 113 mmol/L — ABNORMAL HIGH (ref 98–111)
Creatinine, Ser: 1.54 mg/dL — ABNORMAL HIGH (ref 0.44–1.00)
GFR calc Af Amer: 40 mL/min — ABNORMAL LOW (ref >60)
GFR calc non Af Amer: 35 mL/min — ABNORMAL LOW (ref >60)
Glucose, Bld: 89 mg/dL (ref 70–99)
Potassium: 4.6 mmol/L (ref 3.5–5.1)
Sodium: 143 mmol/L (ref 135–145)
Total Bilirubin: 0.7 mg/dL (ref 0.3–1.2)
Total Protein: 6.5 g/dL (ref 6.5–8.1)

## 2019-03-16 LAB — CBC WITH DIFFERENTIAL/PLATELET
Abs Immature Granulocytes: 0.02 10*3/uL (ref 0.00–0.07)
Basophils Absolute: 0 10*3/uL (ref 0.0–0.1)
Basophils Relative: 0 %
Eosinophils Absolute: 0.2 10*3/uL (ref 0.0–0.5)
Eosinophils Relative: 3 %
HCT: 33.7 % — ABNORMAL LOW (ref 36.0–46.0)
Hemoglobin: 11.2 g/dL — ABNORMAL LOW (ref 12.0–15.0)
Immature Granulocytes: 0 %
Lymphocytes Relative: 21 %
Lymphs Abs: 1.3 10*3/uL (ref 0.7–4.0)
MCH: 31.8 pg (ref 26.0–34.0)
MCHC: 33.2 g/dL (ref 30.0–36.0)
MCV: 95.7 fL (ref 80.0–100.0)
Monocytes Absolute: 0.4 10*3/uL (ref 0.1–1.0)
Monocytes Relative: 7 %
Neutro Abs: 4.1 10*3/uL (ref 1.7–7.7)
Neutrophils Relative %: 69 %
Platelets: 169 10*3/uL (ref 150–400)
RBC: 3.52 MIL/uL — ABNORMAL LOW (ref 3.87–5.11)
RDW: 13 % (ref 11.5–15.5)
WBC: 6 10*3/uL (ref 4.0–10.5)
nRBC: 0 % (ref 0.0–0.2)

## 2019-03-16 MED ORDER — HEPARIN SOD (PORK) LOCK FLUSH 100 UNIT/ML IV SOLN
500.0000 [IU] | Freq: Once | INTRAVENOUS | Status: AC | PRN
  Administered 2019-03-16: 500 [IU]
  Filled 2019-03-16: qty 5

## 2019-03-16 MED ORDER — DIPHENHYDRAMINE HCL 25 MG PO CAPS
ORAL_CAPSULE | ORAL | Status: AC
  Filled 2019-03-16: qty 1

## 2019-03-16 MED ORDER — DIPHENHYDRAMINE HCL 25 MG PO CAPS
25.0000 mg | ORAL_CAPSULE | Freq: Once | ORAL | Status: AC
  Administered 2019-03-16: 25 mg via ORAL

## 2019-03-16 MED ORDER — ACETAMINOPHEN 325 MG PO TABS
ORAL_TABLET | ORAL | Status: AC
  Filled 2019-03-16: qty 2

## 2019-03-16 MED ORDER — TRASTUZUMAB-ANNS CHEMO 150 MG IV SOLR
480.0000 mg | Freq: Once | INTRAVENOUS | Status: AC
  Administered 2019-03-16: 480 mg via INTRAVENOUS
  Filled 2019-03-16: qty 22.86

## 2019-03-16 MED ORDER — SODIUM CHLORIDE 0.9 % IV SOLN
420.0000 mg | Freq: Once | INTRAVENOUS | Status: AC
  Administered 2019-03-16: 420 mg via INTRAVENOUS
  Filled 2019-03-16: qty 14

## 2019-03-16 MED ORDER — SODIUM CHLORIDE 0.9% FLUSH
10.0000 mL | INTRAVENOUS | Status: DC | PRN
  Administered 2019-03-16: 10 mL via INTRAVENOUS
  Filled 2019-03-16: qty 10

## 2019-03-16 MED ORDER — SODIUM CHLORIDE 0.9% FLUSH
10.0000 mL | INTRAVENOUS | Status: DC | PRN
  Administered 2019-03-16: 10 mL
  Filled 2019-03-16: qty 10

## 2019-03-16 MED ORDER — SODIUM CHLORIDE 0.9 % IV SOLN
Freq: Once | INTRAVENOUS | Status: AC
  Filled 2019-03-16: qty 250

## 2019-03-16 MED ORDER — ACETAMINOPHEN 325 MG PO TABS
650.0000 mg | ORAL_TABLET | Freq: Once | ORAL | Status: AC
  Administered 2019-03-16: 650 mg via ORAL

## 2019-03-16 NOTE — Progress Notes (Signed)
Patient has had weight loss. Will adjust the dose of Kanjinti to reflect patient's current weight per Dr. Jana Hakim.

## 2019-03-16 NOTE — Progress Notes (Signed)
Patient declined to stay for 30 minute observation period post-perjeta.

## 2019-03-16 NOTE — Patient Instructions (Signed)
[facility] Discharge Instructions for Patients Receiving Chemotherapy  Today you received the following chemotherapy agents: trastuzumab and pertuzumab.  To help prevent nausea and vomiting after your treatment, we encourage you to take your nausea medication as directed.   If you develop nausea and vomiting that is not controlled by your nausea medication, call the clinic.   BELOW ARE SYMPTOMS THAT SHOULD BE REPORTED IMMEDIATELY:  *FEVER GREATER THAN 100.5 F  *CHILLS WITH OR WITHOUT FEVER  NAUSEA AND VOMITING THAT IS NOT CONTROLLED WITH YOUR NAUSEA MEDICATION  *UNUSUAL SHORTNESS OF BREATH  *UNUSUAL BRUISING OR BLEEDING  TENDERNESS IN MOUTH AND THROAT WITH OR WITHOUT PRESENCE OF ULCERS  *URINARY PROBLEMS  *BOWEL PROBLEMS  UNUSUAL RASH Items with * indicate a potential emergency and should be followed up as soon as possible.  Feel free to call the clinic should you have any questions or concerns. The clinic phone number is [phone].  Please show the CHEMO ALERT CARD at check-in to the Emergency Department and triage nurse.   

## 2019-03-17 ENCOUNTER — Ambulatory Visit (INDEPENDENT_AMBULATORY_CARE_PROVIDER_SITE_OTHER)
Admission: RE | Admit: 2019-03-17 | Discharge: 2019-03-17 | Disposition: A | Discharge status: Home / Self Care | Payer: Self-pay | Source: Ambulatory Visit | Attending: Cardiology | Admitting: Cardiology

## 2019-03-17 ENCOUNTER — Ambulatory Visit: Payer: Self-pay

## 2019-03-17 DIAGNOSIS — I700 Atherosclerosis of aorta: Secondary | ICD-10-CM | POA: Diagnosis not present

## 2019-03-17 DIAGNOSIS — M62571 Muscle wasting and atrophy, not elsewhere classified, right ankle and foot: Secondary | ICD-10-CM | POA: Diagnosis not present

## 2019-03-17 DIAGNOSIS — M25671 Stiffness of right ankle, not elsewhere classified: Secondary | ICD-10-CM | POA: Diagnosis not present

## 2019-03-17 DIAGNOSIS — M25675 Stiffness of left foot, not elsewhere classified: Secondary | ICD-10-CM | POA: Diagnosis not present

## 2019-03-17 DIAGNOSIS — M25674 Stiffness of right foot, not elsewhere classified: Secondary | ICD-10-CM | POA: Diagnosis not present

## 2019-03-17 DIAGNOSIS — R262 Difficulty in walking, not elsewhere classified: Secondary | ICD-10-CM | POA: Diagnosis not present

## 2019-03-17 DIAGNOSIS — M62572 Muscle wasting and atrophy, not elsewhere classified, left ankle and foot: Secondary | ICD-10-CM | POA: Diagnosis not present

## 2019-03-17 DIAGNOSIS — M25672 Stiffness of left ankle, not elsewhere classified: Secondary | ICD-10-CM | POA: Diagnosis not present

## 2019-03-17 DIAGNOSIS — M6281 Muscle weakness (generalized): Secondary | ICD-10-CM | POA: Diagnosis not present

## 2019-03-18 ENCOUNTER — Encounter (INDEPENDENT_AMBULATORY_CARE_PROVIDER_SITE_OTHER): Payer: Self-pay | Admitting: Family Medicine

## 2019-03-18 ENCOUNTER — Encounter (HOSPITAL_COMMUNITY): Payer: Self-pay

## 2019-03-18 ENCOUNTER — Telehealth (HOSPITAL_COMMUNITY): Payer: Self-pay

## 2019-03-18 DIAGNOSIS — I10 Essential (primary) hypertension: Secondary | ICD-10-CM

## 2019-03-18 DIAGNOSIS — I7090 Unspecified atherosclerosis: Secondary | ICD-10-CM

## 2019-03-18 MED ORDER — ROSUVASTATIN CALCIUM 10 MG PO TABS: 10.0000 mg | ORAL_TABLET | Freq: Every day | ORAL | 3 refills | Status: DC

## 2019-03-18 MED ORDER — ASPIRIN EC 81 MG PO TBEC: 81.0000 mg | DELAYED_RELEASE_TABLET | Freq: Every day | ORAL | 3 refills | Status: DC

## 2019-03-18 NOTE — Telephone Encounter (Signed)
-----   Message from Larey Dresser, MD sent at 03/18/2019  3:24 PM EST ----- Calcium score is very high, suggests high risk for cardiac events.  Make sure she does not have chest pain episodes.  Needs secondary prevention with ASA 81 daily and Crestor 10 mg daily.  Lipids/LFTs in 2 months.

## 2019-03-18 NOTE — Telephone Encounter (Signed)
Pt aware of results and recommendations. Pt denies having chest pain.  She will start new meds as prescribed.  Advised to call office if anything changes. Pt will rtc in 2 months for repeat lipids/lfts.

## 2019-03-18 NOTE — Telephone Encounter (Signed)
Please advise 

## 2019-03-19 DIAGNOSIS — M62571 Muscle wasting and atrophy, not elsewhere classified, right ankle and foot: Secondary | ICD-10-CM | POA: Diagnosis not present

## 2019-03-19 DIAGNOSIS — M25674 Stiffness of right foot, not elsewhere classified: Secondary | ICD-10-CM | POA: Diagnosis not present

## 2019-03-19 DIAGNOSIS — M25671 Stiffness of right ankle, not elsewhere classified: Secondary | ICD-10-CM | POA: Diagnosis not present

## 2019-03-19 DIAGNOSIS — M25672 Stiffness of left ankle, not elsewhere classified: Secondary | ICD-10-CM | POA: Diagnosis not present

## 2019-03-19 DIAGNOSIS — M25675 Stiffness of left foot, not elsewhere classified: Secondary | ICD-10-CM | POA: Diagnosis not present

## 2019-03-19 DIAGNOSIS — R262 Difficulty in walking, not elsewhere classified: Secondary | ICD-10-CM | POA: Diagnosis not present

## 2019-03-19 DIAGNOSIS — M62572 Muscle wasting and atrophy, not elsewhere classified, left ankle and foot: Secondary | ICD-10-CM | POA: Diagnosis not present

## 2019-03-19 DIAGNOSIS — M6281 Muscle weakness (generalized): Secondary | ICD-10-CM | POA: Diagnosis not present

## 2019-03-22 ENCOUNTER — Ambulatory Visit: Payer: Medicare PPO | Attending: Internal Medicine

## 2019-03-22 DIAGNOSIS — Z23 Encounter for immunization: Secondary | ICD-10-CM | POA: Insufficient documentation

## 2019-03-22 NOTE — Progress Notes (Signed)
   Covid-19 Vaccination Clinic  Name:  Darlene [name]    MRN: [id number] DOB: 1950-09-19  03/22/2019  [name] was observed post Covid-19 immunization for 15 minutes without incidence. She was provided with Vaccine Information Sheet and instruction to access the V-Safe system.   [name] was instructed to call 911 with any severe reactions post vaccine: [name] Difficulty breathing  . Swelling of your face and throat  . A fast heartbeat  . A bad rash all over your body  . Dizziness and weakness    Immunizations Administered    Name Date Dose VIS Date Route   Pfizer COVID-19 Vaccine 03/22/2019 11:40 AM 0.3 mL 01/01/2019 Intramuscular   Manufacturer: [place]   Lot: [id number]   [place]: [address]-0

## 2019-03-23 ENCOUNTER — Other Ambulatory Visit: Payer: Self-pay | Admitting: Oncology

## 2019-03-24 DIAGNOSIS — M25674 Stiffness of right foot, not elsewhere classified: Secondary | ICD-10-CM | POA: Diagnosis not present

## 2019-03-24 DIAGNOSIS — M25672 Stiffness of left ankle, not elsewhere classified: Secondary | ICD-10-CM | POA: Diagnosis not present

## 2019-03-24 DIAGNOSIS — M62572 Muscle wasting and atrophy, not elsewhere classified, left ankle and foot: Secondary | ICD-10-CM | POA: Diagnosis not present

## 2019-03-24 DIAGNOSIS — M25671 Stiffness of right ankle, not elsewhere classified: Secondary | ICD-10-CM | POA: Diagnosis not present

## 2019-03-24 DIAGNOSIS — M6281 Muscle weakness (generalized): Secondary | ICD-10-CM | POA: Diagnosis not present

## 2019-03-24 DIAGNOSIS — M25675 Stiffness of left foot, not elsewhere classified: Secondary | ICD-10-CM | POA: Diagnosis not present

## 2019-03-24 DIAGNOSIS — R262 Difficulty in walking, not elsewhere classified: Secondary | ICD-10-CM | POA: Diagnosis not present

## 2019-03-24 DIAGNOSIS — M62571 Muscle wasting and atrophy, not elsewhere classified, right ankle and foot: Secondary | ICD-10-CM | POA: Diagnosis not present

## 2019-03-25 ENCOUNTER — Encounter (HOSPITAL_COMMUNITY): Payer: Self-pay

## 2019-03-26 DIAGNOSIS — M6281 Muscle weakness (generalized): Secondary | ICD-10-CM | POA: Diagnosis not present

## 2019-03-26 DIAGNOSIS — R262 Difficulty in walking, not elsewhere classified: Secondary | ICD-10-CM | POA: Diagnosis not present

## 2019-03-26 DIAGNOSIS — M62572 Muscle wasting and atrophy, not elsewhere classified, left ankle and foot: Secondary | ICD-10-CM | POA: Diagnosis not present

## 2019-03-26 DIAGNOSIS — M25674 Stiffness of right foot, not elsewhere classified: Secondary | ICD-10-CM | POA: Diagnosis not present

## 2019-03-26 DIAGNOSIS — M25672 Stiffness of left ankle, not elsewhere classified: Secondary | ICD-10-CM | POA: Diagnosis not present

## 2019-03-26 DIAGNOSIS — M25675 Stiffness of left foot, not elsewhere classified: Secondary | ICD-10-CM | POA: Diagnosis not present

## 2019-03-26 DIAGNOSIS — M25671 Stiffness of right ankle, not elsewhere classified: Secondary | ICD-10-CM | POA: Diagnosis not present

## 2019-03-26 DIAGNOSIS — M62571 Muscle wasting and atrophy, not elsewhere classified, right ankle and foot: Secondary | ICD-10-CM | POA: Diagnosis not present

## 2019-03-29 ENCOUNTER — Ambulatory Visit (INDEPENDENT_AMBULATORY_CARE_PROVIDER_SITE_OTHER): Payer: Medicare PPO

## 2019-03-29 ENCOUNTER — Ambulatory Visit (INDEPENDENT_AMBULATORY_CARE_PROVIDER_SITE_OTHER): Payer: Medicare PPO | Admitting: Podiatry

## 2019-03-29 ENCOUNTER — Encounter: Payer: Self-pay | Admitting: Podiatry

## 2019-03-29 ENCOUNTER — Other Ambulatory Visit: Payer: Self-pay

## 2019-03-29 VITALS — BP 122/61 | HR 83 | Temp 83.0°F | Resp 16

## 2019-03-29 DIAGNOSIS — M25675 Stiffness of left foot, not elsewhere classified: Secondary | ICD-10-CM | POA: Diagnosis not present

## 2019-03-29 DIAGNOSIS — M2011 Hallux valgus (acquired), right foot: Secondary | ICD-10-CM | POA: Diagnosis not present

## 2019-03-29 DIAGNOSIS — M6281 Muscle weakness (generalized): Secondary | ICD-10-CM | POA: Diagnosis not present

## 2019-03-29 DIAGNOSIS — M62572 Muscle wasting and atrophy, not elsewhere classified, left ankle and foot: Secondary | ICD-10-CM | POA: Diagnosis not present

## 2019-03-29 DIAGNOSIS — M25672 Stiffness of left ankle, not elsewhere classified: Secondary | ICD-10-CM | POA: Diagnosis not present

## 2019-03-29 DIAGNOSIS — M25671 Stiffness of right ankle, not elsewhere classified: Secondary | ICD-10-CM | POA: Diagnosis not present

## 2019-03-29 DIAGNOSIS — M62571 Muscle wasting and atrophy, not elsewhere classified, right ankle and foot: Secondary | ICD-10-CM | POA: Diagnosis not present

## 2019-03-29 DIAGNOSIS — M21371 Foot drop, right foot: Secondary | ICD-10-CM

## 2019-03-29 DIAGNOSIS — M21372 Foot drop, left foot: Secondary | ICD-10-CM

## 2019-03-29 DIAGNOSIS — Z09 Encounter for follow-up examination after completed treatment for conditions other than malignant neoplasm: Secondary | ICD-10-CM

## 2019-03-29 DIAGNOSIS — R262 Difficulty in walking, not elsewhere classified: Secondary | ICD-10-CM | POA: Diagnosis not present

## 2019-03-29 DIAGNOSIS — M25674 Stiffness of right foot, not elsewhere classified: Secondary | ICD-10-CM | POA: Diagnosis not present

## 2019-03-29 NOTE — Progress Notes (Signed)
Subjective: Darlene [name] is a 69 y.o. is seen today in office s/p right foot bunionectomy with hammertoe repair preformed on 12/09/2018.  From a surgical standpoint she states that she is doing well.  She has not had any significant discomfort and minimal swelling.  She still doing physical therapy but this is mostly to help with the dropfoot.  Her brace is not and and is scheduled to ship around 04/07/2019. She denies any other weakness, headaches, back pain, chest pain, shortness of breath.  Objective: General: No acute distress, AAOx3  DP/PT pulses palpable 2/4, CRT < 3 sec to all digits.  Protective sensation intact. Motor function intact.  RIGHT foot: Incision is well coapted without any evidence of dehiscence and scars are well formed.  Unfortunate there is recurrence of the hallux abductus and I think part of this is because the dropfoot.  However there is no reoccurrence of the callus formation currently there is no open lesions.  There is no pain.  Dropfoot present bilaterally left side more significant than right.  No pain with calf compression, swelling, warmth, erythema.   Assessment and Plan:  Status post right foot surgery doing well however with dropfoot  -Treatment options discussed including all alternatives, risks, and complications -X-rays obtained reviewed.  Hardware intact of the proximal phalanx as well as the first metatarsal without any evidence of acute fracture.  Increased consolidation across the osteotomy site -From a surgical standpoint she is doing well.  No pain.  No recurrence of the callus.  This, discharge her from the postoperative course.  However we discussed in the future should the toe become problematic likely due to first MPJ arthrodesis. -Awaiting brace.  Continue physical therapy for dropfoot.  Follow-up with neurology as well.  Return in about 6 weeks (around 05/10/2019) for dropfoot.  Trula Slade DPM

## 2019-03-31 ENCOUNTER — Ambulatory Visit (HOSPITAL_COMMUNITY)
Admission: RE | Admit: 2019-03-31 | Discharge: 2019-03-31 | Disposition: A | Discharge status: Home / Self Care | Payer: Medicare PPO | Source: Ambulatory Visit | Attending: Cardiology | Admitting: Cardiology

## 2019-03-31 ENCOUNTER — Other Ambulatory Visit: Payer: Self-pay

## 2019-03-31 ENCOUNTER — Encounter (HOSPITAL_COMMUNITY): Payer: Self-pay | Admitting: Cardiology

## 2019-03-31 VITALS — BP 112/74 | HR 74 | Wt 175.5 lb

## 2019-03-31 DIAGNOSIS — I2510 Atherosclerotic heart disease of native coronary artery without angina pectoris: Secondary | ICD-10-CM | POA: Diagnosis not present

## 2019-03-31 DIAGNOSIS — Z794 Long term (current) use of insulin: Secondary | ICD-10-CM | POA: Diagnosis not present

## 2019-03-31 DIAGNOSIS — Z79899 Other long term (current) drug therapy: Secondary | ICD-10-CM | POA: Insufficient documentation

## 2019-03-31 DIAGNOSIS — Z9011 Acquired absence of right breast and nipple: Secondary | ICD-10-CM | POA: Diagnosis not present

## 2019-03-31 DIAGNOSIS — M1990 Unspecified osteoarthritis, unspecified site: Secondary | ICD-10-CM | POA: Insufficient documentation

## 2019-03-31 DIAGNOSIS — I10 Essential (primary) hypertension: Secondary | ICD-10-CM

## 2019-03-31 DIAGNOSIS — Z7982 Long term (current) use of aspirin: Secondary | ICD-10-CM | POA: Insufficient documentation

## 2019-03-31 DIAGNOSIS — M25671 Stiffness of right ankle, not elsewhere classified: Secondary | ICD-10-CM | POA: Diagnosis not present

## 2019-03-31 DIAGNOSIS — M25675 Stiffness of left foot, not elsewhere classified: Secondary | ICD-10-CM | POA: Diagnosis not present

## 2019-03-31 DIAGNOSIS — Z8249 Family history of ischemic heart disease and other diseases of the circulatory system: Secondary | ICD-10-CM | POA: Diagnosis not present

## 2019-03-31 DIAGNOSIS — K760 Fatty (change of) liver, not elsewhere classified: Secondary | ICD-10-CM | POA: Insufficient documentation

## 2019-03-31 DIAGNOSIS — Z9884 Bariatric surgery status: Secondary | ICD-10-CM | POA: Insufficient documentation

## 2019-03-31 DIAGNOSIS — E785 Hyperlipidemia, unspecified: Secondary | ICD-10-CM | POA: Insufficient documentation

## 2019-03-31 DIAGNOSIS — M25672 Stiffness of left ankle, not elsewhere classified: Secondary | ICD-10-CM | POA: Diagnosis not present

## 2019-03-31 DIAGNOSIS — M62571 Muscle wasting and atrophy, not elsewhere classified, right ankle and foot: Secondary | ICD-10-CM | POA: Diagnosis not present

## 2019-03-31 DIAGNOSIS — Z923 Personal history of irradiation: Secondary | ICD-10-CM | POA: Insufficient documentation

## 2019-03-31 DIAGNOSIS — E1140 Type 2 diabetes mellitus with diabetic neuropathy, unspecified: Secondary | ICD-10-CM | POA: Insufficient documentation

## 2019-03-31 DIAGNOSIS — I7090 Unspecified atherosclerosis: Secondary | ICD-10-CM | POA: Diagnosis not present

## 2019-03-31 DIAGNOSIS — C50919 Malignant neoplasm of unspecified site of unspecified female breast: Secondary | ICD-10-CM | POA: Diagnosis present

## 2019-03-31 DIAGNOSIS — Z170 Estrogen receptor positive status [ER+]: Secondary | ICD-10-CM | POA: Insufficient documentation

## 2019-03-31 DIAGNOSIS — G4733 Obstructive sleep apnea (adult) (pediatric): Secondary | ICD-10-CM | POA: Insufficient documentation

## 2019-03-31 DIAGNOSIS — Z9221 Personal history of antineoplastic chemotherapy: Secondary | ICD-10-CM | POA: Insufficient documentation

## 2019-03-31 DIAGNOSIS — E669 Obesity, unspecified: Secondary | ICD-10-CM | POA: Diagnosis not present

## 2019-03-31 DIAGNOSIS — M25674 Stiffness of right foot, not elsewhere classified: Secondary | ICD-10-CM | POA: Diagnosis not present

## 2019-03-31 DIAGNOSIS — R262 Difficulty in walking, not elsewhere classified: Secondary | ICD-10-CM | POA: Diagnosis not present

## 2019-03-31 DIAGNOSIS — C50911 Malignant neoplasm of unspecified site of right female breast: Secondary | ICD-10-CM | POA: Insufficient documentation

## 2019-03-31 DIAGNOSIS — F329 Major depressive disorder, single episode, unspecified: Secondary | ICD-10-CM | POA: Diagnosis not present

## 2019-03-31 DIAGNOSIS — F419 Anxiety disorder, unspecified: Secondary | ICD-10-CM | POA: Diagnosis not present

## 2019-03-31 DIAGNOSIS — E11319 Type 2 diabetes mellitus with unspecified diabetic retinopathy without macular edema: Secondary | ICD-10-CM | POA: Diagnosis not present

## 2019-03-31 DIAGNOSIS — M6281 Muscle weakness (generalized): Secondary | ICD-10-CM | POA: Diagnosis not present

## 2019-03-31 DIAGNOSIS — M62572 Muscle wasting and atrophy, not elsewhere classified, left ankle and foot: Secondary | ICD-10-CM | POA: Diagnosis not present

## 2019-03-31 NOTE — Patient Instructions (Signed)
Your provider requests you have a lexiscan. (they will contact you to schedule)  Please keep your follow up appointment.

## 2019-03-31 NOTE — Progress Notes (Signed)
Patient ID: Darlene [name], female   DOB: June 27, 1950, 69 y.o.   MRN: [id number] Oncologist: Dr Jana Hakim   HPI: Darlene [name] is a 69 y.o.with history of R breast cancer, right axillary lymph node biopsy 07/19/2014 for a cT2 pN1, stage IIB invasive ductal carcinoma, grade 2, estrogen and progesterone receptor negative, with an MIB-1 of 90%, and HER-2 positive , OSA -> CPAP, bariatric surgery, and DM, referred to cardio-oncology clinic by Dr Jana Hakim  Completed neoadjuvant chemotherapy with carboplatin, docetaxel, trastuzumab and pertuzumab given every 3 weeks 6. Trastuzumab + capecitabine were completed in 9/17.  She had lumpectomy 12/16 and had mastectomy in 1/17.  She has completed radiation.   She was found in 11/17 to have a recurrence by PET => right retropectoral lymph node enlarged. In 2/18, she started on treatment with ado-trastuzumab emtansine, now back on Herceptin.  Echo prior to treatment showed stable EF.  Starting in 6/19, she was transitioned to [facility].   She is now off Kadcyla and back on Herceptin/Perjeta for use long-term.     Echo in 2/21 showed EF 55-60% with more negative strain.   Coronary artery calcium score was done in 2/21, this showed CAC 3653 Agatston units, placing her in the 99th percentile.   She comes in today for followup of CAD.  She is very concerned about her abnormal calcium score.  She has had no chest pain and has no exertional dyspnea.  Her LDL has actually been good off any statin, it was 75 in 2022-02-22.  Her grandfather had an MI in his 61s, her father died of CHF.    Labs 02-22-2022): LDL 75  ECG (personally reviewed): NSR, 1st degree AVB, septal Qs (similar to prior)  Echo (7/16) with EF 60-65%, grade II diastolic dysfunction.  Echo (10/16) with EF 66%, GLS -17%. Echo (1/17) with EF 60-65%, grade II diastolic dysfunction, lateral s' 10, GLS -18.8% Echo (4/17) with EF 60-65%, grade II diastolic dysfunction, lateral s' 9.5, strain poorly traced and off  axis, normal RV size and systolic function.  Echo (6/17) with EF 55-60%, aortic sclerosis without stenosis, difficult images for strain, probably not accurate.  Echo (10/17) with EF 55-60%, mild LVH, moderate diastolic dysfunction, normal RV size and systolic function.  Echo (2/18) with EF 55-60%, GLS -15% but technically difficult study Echo (6/18) with EF 60-65%, GLS -15% but technically difficult study.  Echo (9/18) with EF 55-60%, GLS -19.8%, grade II diastolic dysfunction.  Echo (1/19) with EF 55-60%, normal RV size and systolic function, GLS -12.4% (done with different machine).  Echo (4/19) with EF 55-60%, mild LVH, GLS -17% Echo (9/19) with EF 55-60%, GLS -17.1%, normal RV size and systolic function.  Echo (8/20): EF 50-55%.  Echo (11/20): EF 50-55%, GLS -12.7% (not ideal tracing), mildly decreased RV systolic function.   Echo (2/21): EF 55-60%, GLS -17.9%, grade II diastolic dysfunction, normal RV  Review of Systems: All systems reviewed and negative except as per HPI.   Past Medical History:  Diagnosis Date  . Anemia    hx of - during 1st round chemo  . Anxiety   . Arthritis    in fingers, shoulders  . Back pain   . Balance problem   . Breast cancer ([place])   . Breast cancer of upper-outer quadrant of right female breast ([place]) 07/22/2014  . Bunion, right foot   . Clumsiness   . Constipation   . Depression   . Diabetes mellitus without complication ([name])  20+ years  . Diabetic retinopathy ([place])    torn retina  . Eating disorder    binge eating  . Epistaxis 08/26/2014  . Fatty liver   . HCAP (healthcare-associated pneumonia) 12/18/2014  . Headache   . Heart murmur    never had any problems  . History of radiation therapy 04/05/15-05/19/15   right chest wall was treated to 50.4 Gy in 28 fractions, right mastectomy scar was treated to 10 Gy in 5 fractions  . Hyperlipidemia   . Hypertension   . Joint pain   . Multiple food allergies   . Neuromuscular disorder ([place])     diabetic neuropathy  . Obesity   . Obstructive sleep apnea on CPAP    uses cpap nightly  . Ovarian cyst rupture    possible  . Pneumonia   . Pneumonia 11/2014  . Radiation 04/05/15-05/19/15   right chest wall 50.4 Gy, mastectomy scar 10 Gy  . Sleep apnea   . Thyroid nodule 06/2014    Current Outpatient Medications  Medication Sig Dispense Refill  . Accu-Chek Softclix Lancets lancets 1 each by Other route 4 (four) times daily. Test blood sugars 4 x times daily 100 each 1  . acetaminophen (TYLENOL) 325 MG tablet Take 650 mg by mouth 2 (two) times daily as needed for moderate pain or headache.     [name] aspirin EC 81 MG tablet Take 1 tablet (81 mg total) by mouth daily. 90 tablet 3  . Blood Glucose Monitoring Suppl (ACCU-CHEK GUIDE) w/Device KIT 1 each by Does not apply route 4 (four) times daily. Use to test blood sugars 4x a day 1 kit 0  . buPROPion (WELLBUTRIN SR) 200 MG 12 hr tablet TAKE 1 TABLET AT 12 NOON. 90 tablet 0  . CALCIUM CITRATE-VITAMIN D PO Take 1 tablet by mouth 3 (three) times daily.     . carvedilol (COREG) 3.125 MG tablet Take 1 tablet (3.125 mg total) by mouth 2 (two) times daily. 60 tablet 3  . cholecalciferol (VITAMIN D) 1000 units tablet Take 1,000 Units by mouth daily.    . cholestyramine (QUESTRAN) 4 g packet Take 1 packet (4 g total) by mouth 2 (two) times daily as needed. 10 each 12  . Cyanocobalamin (VITAMIN B-12 PO) Take 1 tablet by mouth every 14 (fourteen) days.    . diphenhydrAMINE (BENADRYL) 25 MG tablet Take 25 mg by mouth daily as needed for allergies or sleep.     [name] glucose blood (ACCU-CHEK GUIDE) test strip 1 each by Other route 4 (four) times daily. Accu Chek Guide  Test blood sugars 4 times a day 100 each 1  . Insulin Glargine (LANTUS SOLOSTAR) 100 UNIT/ML Solostar Pen Inject 12 Units into the skin daily. 15 pen 0  . Insulin Pen Needle (SURE COMFORT PEN NEEDLES) 32G X 4 MM MISC USE WITH INJECTIONS 100 each 0  . liraglutide (VICTOZA) 18 MG/3ML SOPN INJECT  1.8MG(0.3 MLS) INTO THE SKIN EVERY MORNING 9 pen 0  . loratadine (CLARITIN) 10 MG tablet Take 10 mg by mouth as needed for allergies.    [name] losartan (COZAAR) 100 MG tablet Take 1 tablet (100 mg total) by mouth daily. 90 tablet 0  . metFORMIN (GLUCOPHAGE) 500 MG tablet Take 1 tablet (500 mg total) by mouth daily. 90 tablet 0  . Pediatric Multivitamins-Iron (FLINTSTONES PLUS IRON) chewable tablet Chew 1 tablet by mouth 2 (two) times daily.    . Probiotic Product ([employer]S PROBIOTIC PO) Take 1 capsule by mouth  daily.     . prochlorperazine (COMPAZINE) 10 MG tablet Take 1 tablet (10 mg total) by mouth every 6 (six) hours as needed for nausea or vomiting. 30 tablet 0  . Propylene Glycol (SYSTANE BALANCE OP) Place 1-2 drops into both eyes 3 (three) times daily as needed (for dry eyes).     . rosuvastatin (CRESTOR) 10 MG tablet Take 1 tablet (10 mg total) by mouth daily. 90 tablet 3  . senna-docusate (SENNA PLUS) 8.6-50 MG tablet Take 1 tablet by mouth daily as needed for moderate constipation.     . topiramate (TOPAMAX) 25 MG tablet Take 1 tablet (25 mg total) by mouth daily. 90 tablet 0  . vortioxetine HBr (TRINTELLIX) 20 MG TABS tablet Take 1 tablet (20 mg total) by mouth at bedtime. 90 tablet 0   No current facility-administered medications for this encounter.    Allergies  Allergen Reactions  . Nsaids Other (See Comments)    H/o gastric bypass - avoid NSAIDs  . Tape Hives, Rash and Other (See Comments)    Adhesives in bandaids      Social History   Socioeconomic History  . Marital status: Married    Spouse name: [name]  . Number of children: 0  . Years of education: Bachelor's  . Highest education level: Not on file  Occupational History  . Occupation: Pensions consultant: [employer]  Tobacco Use  . Smoking status: Never Smoker  . Smokeless tobacco: Never Used  Substance and Sexual Activity  . Alcohol use: Yes    Alcohol/week: 1.0 - 2.0 standard drinks    Types:  1 - 2 Standard drinks or equivalent per week    Comment: social  . Drug use: No  . Sexual activity: Yes    Partners: Male    Birth control/protection: Post-menopausal, Other-see comments    Comment: husband vasectomy  Other Topics Concern  . Not on file  Social History Narrative   Patient is married [name]) and lives at home with her husband.   Patient does not have any children.   Patient is working for a [employer].   Patient has a Bachelor's degree.   Patient is right-handed.   Patient does not drink any caffeine.   Social Determinants of Health   Financial Resource Strain:   . Difficulty of Paying Living Expenses: Not on file  Food Insecurity:   . Worried About Charity fundraiser in the Last Year: Not on file  . Ran Out of Food in the Last Year: Not on file  Transportation Needs:   . Lack of Transportation (Medical): Not on file  . Lack of Transportation (Non-Medical): Not on file  Physical Activity:   . Days of Exercise per Week: Not on file  . Minutes of Exercise per Session: Not on file  Stress:   . Feeling of Stress : Not on file  Social Connections:   . Frequency of Communication with Friends and Family: Not on file  . Frequency of Social Gatherings with Friends and Family: Not on file  . Attends Religious Services: Not on file  . Active Member of Clubs or Organizations: Not on file  . Attends Archivist Meetings: Not on file  . Marital Status: Not on file  Intimate Partner Violence:   . Fear of Current or Ex-Partner: Not on file  . Emotionally Abused: Not on file  . Physically Abused: Not on file  . Sexually Abused: Not on file  Family History  Problem Relation Age of Onset  . CVA Mother   . Heart disease Mother   . Sudden death Mother   . Diabetes Father   . Heart failure Father   . Hypertension Father   . Kidney disease Father   . Obesity Father   . Hypertension Sister   . Diabetes Brother   . Breast cancer Paternal  Grandmother 9  . Diabetes Paternal Uncle   . Ovarian cancer Neg Hx     Vitals:   03/31/19 1051  BP: 112/74  Pulse: 74  SpO2: 98%  Weight: 79.6 kg (175 lb 8 oz)    PHYSICAL EXAM: General: NAD Neck: No JVD, no thyromegaly or thyroid nodule.  Lungs: Clear to auscultation bilaterally with normal respiratory effort. CV: Nondisplaced PMI.  Heart regular S1/S2, no S3/S4, no murmur.  No peripheral edema.  No carotid bruit.  Normal pedal pulses.  Abdomen: Soft, nontender, no hepatosplenomegaly, no distention.  Skin: Intact without lesions or rashes.  Neurologic: Alert and oriented x 3.  Psych: Normal affect. Extremities: No clubbing or cyanosis.  HEENT: Normal.   ASSESSMENT & PLAN:  1. Breast cancer: Metastatic.  Currently getting Herceptin and Perjeta.  Echo in 11/20 showed EF 50-55%.  Echo in 2/21 showed EF up to 55-60% with more negative strain (-17.9%). - Continue losartan 100 mg daily.  - Continue Coreg 3.125 mg bid.   - Repeat echo + followup scheduled for 6/21.    2. HTN: BP controlled on losartan + Coreg.  3. OSA: Continue nightly CPAP   4. CAD: Coronary artery calcium score was elevated, 3653 Agatston units, placing her in the 99th percentile for her age and gender.  This suggests high risk for future cardiac events.  She has no ischemic symptoms.   - Given the markedly high calcium score, I will arrange for a Lexiscan Cardiolite to assess for significant ischemia.  - Start Crestor 10 mg daily, I will aim for LDL < 50.  - Continue ASA 81, ARB, and beta blocker.   Loralie Champagne 03/31/2019

## 2019-04-01 ENCOUNTER — Telehealth (HOSPITAL_COMMUNITY): Payer: Self-pay | Admitting: *Deleted

## 2019-04-01 NOTE — Telephone Encounter (Signed)
Left message on voicemail per DPR in reference to upcoming appointment scheduled on 04/05/19 with detailed instructions given per Myocardial Perfusion Study Information Sheet for the test. LM to arrive 15 minutes early, and that it is imperative to arrive on time for appointment to keep from having the test rescheduled. If you need to cancel or reschedule your appointment, please call the office within 24 hours of your appointment. Failure to do so may result in a cancellation of your appointment, and a $50 no show fee. Phone number given for call back for any questions. Kirstie Peri

## 2019-04-05 ENCOUNTER — Ambulatory Visit (HOSPITAL_COMMUNITY): Payer: Medicare PPO | Attending: Cardiology

## 2019-04-05 ENCOUNTER — Other Ambulatory Visit: Payer: Self-pay

## 2019-04-05 DIAGNOSIS — M25674 Stiffness of right foot, not elsewhere classified: Secondary | ICD-10-CM | POA: Diagnosis not present

## 2019-04-05 DIAGNOSIS — M62571 Muscle wasting and atrophy, not elsewhere classified, right ankle and foot: Secondary | ICD-10-CM | POA: Diagnosis not present

## 2019-04-05 DIAGNOSIS — I10 Essential (primary) hypertension: Secondary | ICD-10-CM | POA: Diagnosis not present

## 2019-04-05 DIAGNOSIS — M25671 Stiffness of right ankle, not elsewhere classified: Secondary | ICD-10-CM | POA: Diagnosis not present

## 2019-04-05 DIAGNOSIS — M6281 Muscle weakness (generalized): Secondary | ICD-10-CM | POA: Diagnosis not present

## 2019-04-05 DIAGNOSIS — I509 Heart failure, unspecified: Secondary | ICD-10-CM | POA: Diagnosis not present

## 2019-04-05 DIAGNOSIS — I110 Hypertensive heart disease with heart failure: Secondary | ICD-10-CM | POA: Insufficient documentation

## 2019-04-05 DIAGNOSIS — M25675 Stiffness of left foot, not elsewhere classified: Secondary | ICD-10-CM | POA: Diagnosis not present

## 2019-04-05 DIAGNOSIS — M62572 Muscle wasting and atrophy, not elsewhere classified, left ankle and foot: Secondary | ICD-10-CM | POA: Diagnosis not present

## 2019-04-05 DIAGNOSIS — M25672 Stiffness of left ankle, not elsewhere classified: Secondary | ICD-10-CM | POA: Diagnosis not present

## 2019-04-05 DIAGNOSIS — R262 Difficulty in walking, not elsewhere classified: Secondary | ICD-10-CM | POA: Diagnosis not present

## 2019-04-05 LAB — MYOCARDIAL PERFUSION IMAGING
LV dias vol: 72 mL (ref 46–106)
LV sys vol: 36 mL
Peak HR: 88 {beats}/min
Rest HR: 61 {beats}/min
SDS: 0
SRS: 0
SSS: 0
TID: 0.85

## 2019-04-05 MED ORDER — TECHNETIUM TC 99M TETROFOSMIN IV KIT
30.9000 | PACK | Freq: Once | INTRAVENOUS | Status: AC | PRN
  Administered 2019-04-05: 30.9 via INTRAVENOUS
  Filled 2019-04-05: qty 31

## 2019-04-05 MED ORDER — TECHNETIUM TC 99M TETROFOSMIN IV KIT
10.8000 | PACK | Freq: Once | INTRAVENOUS | Status: AC | PRN
  Administered 2019-04-05: 10.8 via INTRAVENOUS
  Filled 2019-04-05: qty 11

## 2019-04-05 MED ORDER — REGADENOSON 0.4 MG/5ML IV SOLN
0.4000 mg | Freq: Once | INTRAVENOUS | Status: AC
  Administered 2019-04-05: 0.4 mg via INTRAVENOUS

## 2019-04-07 DIAGNOSIS — M6281 Muscle weakness (generalized): Secondary | ICD-10-CM | POA: Diagnosis not present

## 2019-04-07 DIAGNOSIS — M25671 Stiffness of right ankle, not elsewhere classified: Secondary | ICD-10-CM | POA: Diagnosis not present

## 2019-04-07 DIAGNOSIS — M62572 Muscle wasting and atrophy, not elsewhere classified, left ankle and foot: Secondary | ICD-10-CM | POA: Diagnosis not present

## 2019-04-07 DIAGNOSIS — R262 Difficulty in walking, not elsewhere classified: Secondary | ICD-10-CM | POA: Diagnosis not present

## 2019-04-07 DIAGNOSIS — M25672 Stiffness of left ankle, not elsewhere classified: Secondary | ICD-10-CM | POA: Diagnosis not present

## 2019-04-07 DIAGNOSIS — M62571 Muscle wasting and atrophy, not elsewhere classified, right ankle and foot: Secondary | ICD-10-CM | POA: Diagnosis not present

## 2019-04-07 DIAGNOSIS — M25674 Stiffness of right foot, not elsewhere classified: Secondary | ICD-10-CM | POA: Diagnosis not present

## 2019-04-07 DIAGNOSIS — M25675 Stiffness of left foot, not elsewhere classified: Secondary | ICD-10-CM | POA: Diagnosis not present

## 2019-04-09 DIAGNOSIS — G4733 Obstructive sleep apnea (adult) (pediatric): Secondary | ICD-10-CM | POA: Diagnosis not present

## 2019-04-12 ENCOUNTER — Other Ambulatory Visit: Payer: Self-pay

## 2019-04-12 ENCOUNTER — Ambulatory Visit (INDEPENDENT_AMBULATORY_CARE_PROVIDER_SITE_OTHER): Payer: Medicare PPO | Admitting: Orthotics

## 2019-04-12 DIAGNOSIS — M21372 Foot drop, left foot: Secondary | ICD-10-CM | POA: Diagnosis not present

## 2019-04-12 DIAGNOSIS — R262 Difficulty in walking, not elsewhere classified: Secondary | ICD-10-CM | POA: Diagnosis not present

## 2019-04-12 DIAGNOSIS — M62571 Muscle wasting and atrophy, not elsewhere classified, right ankle and foot: Secondary | ICD-10-CM | POA: Diagnosis not present

## 2019-04-12 DIAGNOSIS — M25675 Stiffness of left foot, not elsewhere classified: Secondary | ICD-10-CM | POA: Diagnosis not present

## 2019-04-12 DIAGNOSIS — M2011 Hallux valgus (acquired), right foot: Secondary | ICD-10-CM

## 2019-04-12 DIAGNOSIS — M2041 Other hammer toe(s) (acquired), right foot: Secondary | ICD-10-CM

## 2019-04-12 DIAGNOSIS — M25671 Stiffness of right ankle, not elsewhere classified: Secondary | ICD-10-CM | POA: Diagnosis not present

## 2019-04-12 DIAGNOSIS — M25672 Stiffness of left ankle, not elsewhere classified: Secondary | ICD-10-CM | POA: Diagnosis not present

## 2019-04-12 DIAGNOSIS — M6281 Muscle weakness (generalized): Secondary | ICD-10-CM | POA: Diagnosis not present

## 2019-04-12 DIAGNOSIS — M25674 Stiffness of right foot, not elsewhere classified: Secondary | ICD-10-CM | POA: Diagnosis not present

## 2019-04-12 DIAGNOSIS — L84 Corns and callosities: Secondary | ICD-10-CM

## 2019-04-12 DIAGNOSIS — M21371 Foot drop, right foot: Secondary | ICD-10-CM

## 2019-04-12 DIAGNOSIS — M62572 Muscle wasting and atrophy, not elsewhere classified, left ankle and foot: Secondary | ICD-10-CM | POA: Diagnosis not present

## 2019-04-12 NOTE — Progress Notes (Signed)
[name]  [phone] [phone]) [phone]    ID: Darlene [name] DOB: [birth year]  MR#: [id number]  JSE#:[id number]  Patient Care Team: Vernie Shanks, MD as PCP - General (Family Medicine) Stark Klein, MD as Consulting Physician (General Surgery) Aicia Babinski, Virgie Dad, MD as Consulting Physician (Oncology) Mauro Kaufmann, RN as Registered Nurse Rockwell Germany, RN as Registered Nurse Jacelyn Pi, MD as Consulting Physician (Endocrinology) [name], MD as Consulting Physician (Plastic Surgery) Larey Dresser, MD as Consulting Physician (Cardiology) Gery Pray, MD as Consulting Physician (Radiation Oncology) Juanita Craver, MD as Consulting Physician (Gastroenterology) Dohmeier, Asencion Partridge, MD as Consulting Physician (Neurology)  OTHER MD:   CHIEF COMPLAINT:  HER-2 positive, estrogen receptor negative locally recurrent disease  CURRENT TREATMENT:  Trastuzumab; Pertuzumab   INTERVAL HISTORY:  Darlene [name] returns today for follow-up and treatment of her estrogen receptor negative but HER-2 amplified breast cancer.   She continues on Trastuzumab/Pertuzumab every 3 weeks.  She is tolerating this well, and the plan is to continue indefinitely so long as there is no evidence of disease recurrence.  Since her last visit, she underwent repeat echocardiogram on 02/26/2019 showing an ejection fraction of 55-60%. This is slightly improved from prior in 11/2018.  She was evaluated by Dr. Ander Slade on 03/31/2019.  She continues on losartan and Coreg.  She has a repeat echo scheduled on 07/12/2019  She also underwent cardiac scoring CT on 03/17/2019 showing a markedly high calcium score.  She is terrified about this.  She was started on Crestor and continued on aspirin 81 mg daily.  She tells me her goal is an LDL of less than 40  She had a liver MRI December 2020 which showed no active disease and a PET scan April 2020 which showed no other definite sites of  disease.   REVIEW OF SYSTEMS: Darlene [name] is not exercising because she has significant peripheral neuropathy issuesShe tells me she has a little bit of dropfoot bilaterally left greater than right.  She is wearing a brace on her left ankle currently after a couple of falls.  She is using a cane which is helping stabilize her.  The used to have a stationary bike but they sold it because they were never using it.  She has lost quite a bit of weight, but wants to keep the weight she has now and will be working with her primary care nurse nutritionist.  She has had no nausea or vomiting or taste alteration.  Also she is very concerned about Darlene [name]'s new health problems.  A detailed review of systems was otherwise stable.  BREAST CANCER HISTORY: From the original intake note:  "Darlene [name]" had screening mammography at her gynecologist suggestive of a possible abnormality in the right breast. However right diagnostic mammogram 04/12/2013 at [facility] was negative. More recently however, she noted a lump in her right breast and brought it to her gynecologist's attention. On 07/18/2014 the patient had bilateral diagnostic mammography with tomosynthesis at [facility]. The breast density was category B. In the right breast upper outer quadrant there was an area of focal asymmetry with indistinct margins.[name] Ultrasound was obtained and showed in addition to the mass in question, measuring 1.7 cm, an abnormal-appearing lymph node in the right axillary tail.  On 07/19/2014 the patient underwent biopsy of the right breast massin question as well as a right axillary lymph node. Both were positive for invasive ductal carcinoma, grade 2, estrogen and progesterone receptor negative, with an MIB-1 of 90%, and  HER-2 pending. Incidentally the lymph node biopsy showed a lymphocytic inflammatory component but no lymph node architecture.  Her subsequent history is as detailed above.   PAST MEDICAL HISTORY: Past Medical History:  Diagnosis Date    . Anemia    hx of - during 1st round chemo  . Anxiety   . Arthritis    in fingers, shoulders  . Back pain   . Balance problem   . Breast cancer ([place])   . Breast cancer of upper-outer quadrant of right female breast ([place]) 07/22/2014  . Bunion, right foot   . Clumsiness   . Constipation   . Depression   . Diabetes mellitus without complication ([place])    80+ years  . Diabetic retinopathy ([place])    torn retina  . Eating disorder    binge eating  . Epistaxis 08/26/2014  . Fatty liver   . HCAP (healthcare-associated pneumonia) 12/18/2014  . Headache   . Heart murmur    never had any problems  . History of radiation therapy 04/05/15-05/19/15   right chest wall was treated to 50.4 Gy in 28 fractions, right mastectomy scar was treated to 10 Gy in 5 fractions  . Hyperlipidemia   . Hypertension   . Joint pain   . Multiple food allergies   . Neuromuscular disorder ([place])    diabetic neuropathy  . Obesity   . Obstructive sleep apnea on CPAP    uses cpap nightly  . Ovarian cyst rupture    possible  . Pneumonia   . Pneumonia 11/2014  . Radiation 04/05/15-05/19/15   right chest wall 50.4 Gy, mastectomy scar 10 Gy  . Sleep apnea   . Thyroid nodule 06/2014    PAST SURGICAL HISTORY: Past Surgical History:  Procedure Laterality Date  . APPENDECTOMY    . BIOPSY THYROID Bilateral 06/2014   2 nodules - Benign   . BREAST LUMPECTOMY WITH RADIOACTIVE SEED AND SENTINEL LYMPH NODE BIOPSY Right 12/27/2014   Procedure: BREAST LUMPECTOMY WITH RADIOACTIVE SEED AND SENTINEL LYMPH NODE BIOPSY;  Surgeon: Stark Klein, MD;  Location: [place];  Service: General;  Laterality: Right;  . BREAST REDUCTION SURGERY Bilateral 01/06/2015   Procedure: BILATERAL BREAST REDUCTION, RIGHT ONCOPLASTIC RECONSTRUCTION),LEFT BREAST REDUCTION FOR SYMMETRY;  Surgeon: [name], MD;  Location: [place];  Service: Plastics;  Laterality: Bilateral;  . BREATH TEK H PYLORI N/A 09/15/2012   Procedure: BREATH TEK  H PYLORI;  Surgeon: Pedro Earls, MD;  Location: [facility];  Service: General;  Laterality: N/A;  . BUNIONECTOMY Left 12/2013  . CARPAL TUNNEL RELEASE Right   . CARPAL TUNNEL RELEASE Left   . CATARACT EXTRACTION     6/19  . COLONOSCOPY    . DUPUYTREN CONTRACTURE RELEASE    . GASTRIC ROUX-EN-Y N/A 11/02/2012   Procedure: LAPAROSCOPIC ROUX-EN-Y GASTRIC;  Surgeon: Pedro Earls, MD;  Location: [facility];  Service: General;  Laterality: N/A;  . IR GENERIC HISTORICAL  03/18/2016   IR US GUIDE VASC ACCESS LEFT 03/18/2016 Markus Daft, MD [facility]  . IR GENERIC HISTORICAL  03/18/2016   IR FLUORO GUIDE CV LINE LEFT 03/18/2016 Markus Daft, MD [facility]  . LAPAROSCOPIC APPENDECTOMY N/A 07/22/2015   Procedure: APPENDECTOMY LAPAROSCOPIC;  Surgeon: Michael Boston, MD;  Location: [facility];  Service: General;  Laterality: N/A;  . MASTECTOMY Right 02/15/2015   modified  . MODIFIED MASTECTOMY Right 02/15/2015   Procedure: RIGHT MODIFIED RADICAL MASTECTOMY;  Surgeon: Stark Klein, MD;  Location: [place];  Service: General;  Laterality: Right;  . PORT-A-CATH REMOVAL Left 10/10/2015   Procedure: PORT REMOVAL;  Surgeon: Stark Klein, MD;  Location: [place];  Service: General;  Laterality: Left;  . PORTACATH PLACEMENT Left 08/02/2014   Procedure: INSERTION PORT-A-CATH;  Surgeon: Stark Klein, MD;  Location: [place];  Service: General;  Laterality: Left;  . PORTACATH PLACEMENT N/A 11/05/2016   Procedure: INSERTION PORT-A-CATH;  Surgeon: Stark Klein, MD;  Location: [place];  Service: General;  Laterality: N/A;  . RETINAL LASER PROCEDURE     retina tear on left eye  . ROTATOR CUFF REPAIR Right 2005  . SCAR REVISION Right 12/08/2015   Procedure: COMPLEX REPAIR RIGHT CHEST 10-15CM;  Surgeon: [name], MD;  Location: [place];  Service: Plastics;  Laterality: Right;  . TOE SURGERY  1970s   bone spur  . TRIGGER FINGER RELEASE     x3    FAMILY HISTORY Family History    Problem Relation Age of Onset  . CVA Mother   . Heart disease Mother   . Sudden death Mother   . Diabetes Father   . Heart failure Father   . Hypertension Father   . Kidney disease Father   . Obesity Father   . Hypertension Sister   . Diabetes Brother   . Breast cancer Paternal Grandmother 40  . Diabetes Paternal Uncle   . Ovarian cancer Neg Hx    The patient's father died from complications of diabetes at age 61. The patient's mother died at age 97 with a subarachnoid hemorrhage. The patient had one brother, one sister. The only breast cancer was the patient's paternal grandmother diagnosed in her 69s. There is no history of ovarian cancer in the family.   GYNECOLOGIC HISTORY:  Patient's last menstrual period was 10/22/2007.  menarche age 34, the patient is GX P0. She went through the change of life in 2011. She did not take hormone replacement.   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 Her husband [name] is a retired Visual merchandiser (he taught at [employer]).  In May 2020 they moved to [facility] in [place].  [name] has 2 children from an earlier marriage, Rockey Situ who teaches in [place], and Jonnae Fonseca,  who works at the [employer] in in [place]. He has 1 grand son, aged 46. The patient and her husband are not church attenders.   ADVANCED DIRECTIVES:  Not in place   HEALTH MAINTENANCE: Social History   Tobacco Use  . Smoking status: Never Smoker  . Smokeless tobacco: Never Used  Substance Use Topics  . Alcohol use: Yes    Alcohol/week: 1.0 - 2.0 standard drinks    Types: 1 - 2 Standard drinks or equivalent per week    Comment: social  . Drug use: No     Colonoscopy:  May 2016  PAP:  Bone density: 07/01/2016 at [facility] showed normal, with a T score of -0.7  Lipid panel:  Allergies   Allergen Reactions  . Nsaids Other (See Comments)    H/o gastric bypass - avoid NSAIDs  . Tape Hives, Rash and Other (See Comments)    Adhesives in bandaids    Current Outpatient Medications  Medication Sig Dispense Refill  . Accu-Chek Softclix Lancets lancets 1 each by Other route 4 (four) times daily. Test blood sugars  4 x times daily 100 each 1  . acetaminophen (TYLENOL) 325 MG tablet Take 650 mg by mouth 2 (two) times daily as needed for moderate pain or headache.     [name] aspirin EC 81 MG tablet Take 1 tablet (81 mg total) by mouth daily. 90 tablet 3  . Blood Glucose Monitoring Suppl (ACCU-CHEK GUIDE) w/Device KIT 1 each by Does not apply route 4 (four) times daily. Use to test blood sugars 4x a day 1 kit 0  . buPROPion (WELLBUTRIN SR) 200 MG 12 hr tablet TAKE 1 TABLET AT 12 NOON. 90 tablet 0  . CALCIUM CITRATE-VITAMIN D PO Take 1 tablet by mouth 3 (three) times daily.     . carvedilol (COREG) 3.125 MG tablet TAKE 1 TABLET BY MOUTH TWICE DAILY. 180 tablet 3  . cholecalciferol (VITAMIN D) 1000 units tablet Take 1,000 Units by mouth daily.    . cholestyramine (QUESTRAN) 4 g packet Take 1 packet (4 g total) by mouth 2 (two) times daily as needed. 10 each 12  . Cyanocobalamin (VITAMIN B-12 PO) Take 1 tablet by mouth every 14 (fourteen) days.    . diphenhydrAMINE (BENADRYL) 25 MG tablet Take 25 mg by mouth daily as needed for allergies or sleep.     [name] glucose blood (ACCU-CHEK GUIDE) test strip 1 each by Other route 4 (four) times daily. Accu Chek Guide  Test blood sugars 4 times a day 100 each 1  . Insulin Glargine (LANTUS SOLOSTAR) 100 UNIT/ML Solostar Pen Inject 12 Units into the skin daily. 15 pen 0  . Insulin Pen Needle (SURE COMFORT PEN NEEDLES) 32G X 4 MM MISC USE WITH INJECTIONS 100 each 0  . liraglutide (VICTOZA) 18 MG/3ML SOPN INJECT 1.8MG(0.3 MLS) INTO THE SKIN EVERY MORNING 9 pen 0  . loratadine (CLARITIN) 10 MG tablet Take 10 mg by mouth as needed for allergies.    [name] losartan  (COZAAR) 100 MG tablet Take 1 tablet (100 mg total) by mouth daily. 90 tablet 0  . metFORMIN (GLUCOPHAGE) 500 MG tablet Take 1 tablet (500 mg total) by mouth daily. 90 tablet 0  . Pediatric Multivitamins-Iron (FLINTSTONES PLUS IRON) chewable tablet Chew 1 tablet by mouth 2 (two) times daily.    . Probiotic Product (CVS PROBIOTIC PO) Take 1 capsule by mouth daily.     . prochlorperazine (COMPAZINE) 10 MG tablet Take 1 tablet (10 mg total) by mouth every 6 (six) hours as needed for nausea or vomiting. 30 tablet 0  . Propylene Glycol (SYSTANE BALANCE OP) Place 1-2 drops into both eyes 3 (three) times daily as needed (for dry eyes).     . rosuvastatin (CRESTOR) 10 MG tablet Take 1 tablet (10 mg total) by mouth daily. 90 tablet 3  . senna-docusate (SENNA PLUS) 8.6-50 MG tablet Take 1 tablet by mouth daily as needed for moderate constipation.     . topiramate (TOPAMAX) 25 MG tablet Take 1 tablet (25 mg total) by mouth daily. 90 tablet 0  . vortioxetine HBr (TRINTELLIX) 20 MG TABS tablet Take 1 tablet (20 mg total) by mouth at bedtime. 90 tablet 0   No current facility-administered medications for this visit.    OBJECTIVE: White woman in no acute distress  Vitals:   04/13/19 1255  BP: (!) 110/51  Pulse: 68  Resp: 18  Temp: 98.3 F (36.8 C)  SpO2: 100%     Body mass index is 26.02 kg/m.     ECOG FS:1 - Symptomatic but completely ambulatory  Filed Weights   04/13/19 1255  Weight: 176 lb 3.2 oz (79.9 kg)     Sclerae unicteric, EOMs intact Wearing a mask No cervical or supraclavicular adenopathy Lungs no rales or rhonchi Heart regular rate and rhythm Abd soft, nontender, positive bowel sounds MSK no focal spinal tenderness, no upper extremity lymphedema Neuro: nonfocal, well oriented, appropriate affect Breasts: Status post right mastectomy with no evidence of chest wall recurrence.  The left breast is unremarkable.  Both axillae are benign.   LAB RESULTS:  CMP     Component  Value Date/Time   NA 143 03/16/2019 1248   NA 146 (H) 01/25/2019 0957   NA 140 01/07/2017 0758   K 4.6 03/16/2019 1248   K 4.1 01/07/2017 0758   CL 113 (H) 03/16/2019 1248   CO2 22 03/16/2019 1248   CO2 24 01/07/2017 0758   GLUCOSE 89 03/16/2019 1248   GLUCOSE 134 01/07/2017 0758   BUN 38 (H) 03/16/2019 1248   BUN 25 01/25/2019 0957   BUN 33.6 (H) 01/07/2017 0758   CREATININE 1.54 (H) 03/16/2019 1248   CREATININE 1.62 (H) 04/28/2018 0915   CREATININE 1.5 (H) 01/07/2017 0758   CALCIUM 8.7 (L) 03/16/2019 1248   CALCIUM 9.3 01/07/2017 0758   PROT 6.5 03/16/2019 1248   PROT 6.6 01/25/2019 0957   PROT 6.7 01/07/2017 0758   ALBUMIN 3.8 03/16/2019 1248   ALBUMIN 4.3 01/25/2019 0957   ALBUMIN 3.6 01/07/2017 0758   AST 31 03/16/2019 1248   AST 43 (H) 04/28/2018 0915   AST 51 (H) 01/07/2017 0758   ALT 47 (H) 03/16/2019 1248   ALT 61 (H) 04/28/2018 0915   ALT 82 (H) 01/07/2017 0758   ALKPHOS 99 03/16/2019 1248   ALKPHOS 115 01/07/2017 0758   BILITOT 0.7 03/16/2019 1248   BILITOT 0.7 01/25/2019 0957   BILITOT 0.6 04/28/2018 0915   BILITOT 0.72 01/07/2017 0758   GFRNONAA 35 (L) 03/16/2019 1248   GFRNONAA 33 (L) 04/28/2018 0915   GFRNONAA 55 (L) 02/18/2013 0827   GFRAA 40 (L) 03/16/2019 1248   GFRAA 38 (L) 04/28/2018 0915   GFRAA 63 02/18/2013 0827    INo results found for: SPEP, UPEP  Lab Results  Component Value Date   WBC 5.9 04/13/2019   NEUTROABS 4.0 04/13/2019   HGB 11.4 (L) 04/13/2019   HCT 33.9 (L) 04/13/2019   MCV 93.9 04/13/2019   PLT 167 04/13/2019      Chemistry      Component Value Date/Time   NA 143 03/16/2019 1248   NA 146 (H) 01/25/2019 0957   NA 140 01/07/2017 0758   K 4.6 03/16/2019 1248   K 4.1 01/07/2017 0758   CL 113 (H) 03/16/2019 1248   CO2 22 03/16/2019 1248   CO2 24 01/07/2017 0758   BUN 38 (H) 03/16/2019 1248   BUN 25 01/25/2019 0957   BUN 33.6 (H) 01/07/2017 0758   CREATININE 1.54 (H) 03/16/2019 1248   CREATININE 1.62 (H) 04/28/2018  0915   CREATININE 1.5 (H) 01/07/2017 0758      Component Value Date/Time   CALCIUM 8.7 (L) 03/16/2019 1248   CALCIUM 9.3 01/07/2017 0758   ALKPHOS 99 03/16/2019 1248   ALKPHOS 115 01/07/2017 0758   AST 31 03/16/2019 1248   AST 43 (H) 04/28/2018 0915   AST 51 (H) 01/07/2017 0758   ALT 47 (H) 03/16/2019 1248   ALT 61 (H) 04/28/2018 0915   ALT 82 (H) 01/07/2017 0758   BILITOT 0.7  03/16/2019 1248   BILITOT 0.7 01/25/2019 0957   BILITOT 0.6 04/28/2018 0915   BILITOT 0.72 01/07/2017 0758       No results found for: LABCA2  No components found for: LABCA125  No results for input(s): INR in the last 168 hours.  Urinalysis    Component Value Date/Time   COLORURINE YELLOW 07/21/2015 [place] 07/21/2015 2328   LABSPEC 1.020 07/21/2015 2328   PHURINE 6.0 07/21/2015 2328   GLUCOSEU NEGATIVE 07/21/2015 2328   HGBUR TRACE (A) 07/21/2015 2328   BILIRUBINUR NEGATIVE 07/21/2015 2328   BILIRUBINUR neg 06/29/2013 [place] 07/21/2015 2328   PROTEINUR 30 (A) 07/21/2015 2328   UROBILINOGEN negative 06/29/2013 1017   NITRITE NEGATIVE 07/21/2015 2328   LEUKOCYTESUR NEGATIVE 07/21/2015 2328    STUDIES: CT CARDIAC SCORING  Addendum Date: 03/31/2019   ADDENDUM REPORT: 03/31/2019 11:36 ADDENDUM: Calcium score re-analyzed, now 3653 Agatston units. This still places the patient in the 99th percentile for age and gender. Electronically Signed   By: Loralie Champagne M.D.   On: 03/31/2019 11:36   Addendum Date: 03/18/2019   ADDENDUM REPORT: 03/18/2019 09:52 CLINICAL DATA:  Risk stratification EXAM: Coronary Calcium Score TECHNIQUE: The patient was scanned on a Siemens Sensation 16 slice scanner. Axial non-contrast 82m slices were carried out through the heart. The data set was analyzed on a dedicated work station and scored using the [place] FINDINGS: Non-cardiac: [facility] separate [facility]. Ascending Aorta: 3.8 cm, mildly dilated  Pericardium: Normal IMPRESSION: Coronary calcium score of 5766. This was 99th percentile for age and sex matched control, suggesting high risk for future cardiac events. Markedly high calcium score. Dalton Mclean Electronically Signed   By: DLoralie ChampagneM.D.   On: 03/18/2019 09:52   Result Date: 03/31/2019 EXAM: OVER-READ INTERPRETATION  CT CHEST The following report is an over-read performed by radiologist Dr. DVinnie Langtonof [facility] [place] 03/18/2019. This over-read does not include interpretation of cardiac or coronary anatomy or pathology. The coronary calcium score interpretation by the cardiologist is attached. COMPARISON:  Chest CT 02/02/2015 FINDINGS: Aortic atherosclerosis. Central venous catheter tip terminating in the distal superior vena cava. Within the visualized portions of the thorax there are no suspicious appearing pulmonary nodules or masses, there is no acute consolidative airspace disease, no pleural effusions, no pneumothorax and no lymphadenopathy. Visualized portions of the upper abdomen are unremarkable. There are no aggressive appearing lytic or blastic lesions noted in the visualized portions of the skeleton. Status post right modified radical mastectomy. IMPRESSION: 1.  Aortic Atherosclerosis (ICD10-I70.0). Electronically Signed: By: DVinnie LangtonM.D. On: 03/18/2019 08:15   DG Foot Complete Right  Result Date: 04/02/2019 Please see detailed radiograph report in office note.  MYOCARDIAL PERFUSION IMAGING  Result Date: 04/05/2019  Nuclear stress EF: 51%.  There was no ST segment deviation noted during stress.  The study is normal.  This is a low risk study.  The left ventricular ejection fraction is normal (55-65%).  Normal pharmacologic nuclear stress test with no prior infarct and no ischemia. LVEF calculated at 51%, however normal on the recent echocardiogram.    ASSESSMENT: 69y.o. [place] woman status post right breast upper outer quadrant and  right axillary lymph node biopsy 07/19/2014 for a cT2  pN1, stage IIB  invasive ductal carcinoma, grade 2, estrogen and progesterone receptor negative, with an MIB-1 of 90%, and HER-2 amplified  (1) neoadjuvant chemotherapy started 08/08/2014, consisting of carboplatin, docetaxel, trastuzumab  and pertuzumab given every 3 weeks 6, completed 11/21/2014  (a) breast MRI after initial 3 cycles shows a significant response  (b) breast MRI after 6 cycles shows a complete radiologic response   (2) trastuzumab continued through 10/02/2015 (to end of capecitabine therapy)  (a) echo 02/13/2015 shows an EF of 60-65%  (b)  echo 05/09/2015 shows an ejection fraction of 60-65%  (c) echocardiogram 06/27/2015 shows an ejection fraction of 55-60%.  (d) echocardiogram 01/22/2017 shows an ejection fraction of 55-60%  (3) right lumpectomy with sentinel lymph node biopsy on 12/27/14 showed a residual  ypT2 ypN1a, stage IIB invasive ductal carcinoma, grade 2, with repeat estrogen and progesterone receptors again negative  (4) status post right oncoplastic surgery (with left reduction mammoplasty) 01/06/2015 showing in the right breast multiple foci of intralymphatic carcinoma in random sections  (5)  Right modified radical mastectomy 02/15/2014 showed no residual invasive disease; there was DCIS but margins were negative; all 15 axillary lymph nodes were clear; ypTis ypN0  (a)  The patient has decided against reconstruction.  (6)  Postmastectomy radiation completed 05/18/2015 with capecitabine sensitization  (7) adjuvant capecitabine continued through 09/27/2015  (8) hepatic steatosis with increased fibrosis score (at risk for cirrhosis)  (9) thyroid biopsy (left lobe inferiorly) 2 shows benign tissue 06/28/2015  (10) LOCAL RECURRENCE:  (a) PET scan 12/06/2015 Notes a right retropectoral lymph node measuring 0.9 cm with an SUV max of 5.5  (b) repeat PET scan 03/11/2016 finds the right subpectoral lymph node  to now measure 1.3 cm with an SUV max of 11.8.  (c) PET scan 05/07/2016 shows a significant response to T-DM 1  (d) PET scan 07/10/2017 is clear except for what appears to be a single new liver lesion   (11) started T-DM1 03/21/2016, completed 8 doses 08/13/2016  (a) PET scan 08/19/2016 shows a good response but measurable residual disease  (b) PET scan on 01/16/2017 shows a continuing response  (12) maintenance trastuzumab started 09/03/2016--changed to every 28 days as of 01/28/2017  (a) repeat echocardiogram 07/08/2016 shows an ejection fraction of 60-65%.  (b) echocardiogram on 10/08/2016 demonstrates LVEF of 55-60% (followed by Dr. Haroldine Laws)  (c) echo 01/22/2017 shows an ejection fraction in the 55-60% range  (d) echo 04/24/2017 shows an ejection fraction in the 55-60% range (recommendation for echo every 6 months by Dr. Aundra Dubin)  (e) trastuzumab changed to T-DM 1 after trastuzumab dose 07/15/2017  METASTATIC DISEASE: July 2019 (13)  Radiation to the area of persistent disease in the right upper chest completed 10/30/2016   (14) T-DM 1 resumed 08/12/2017, repeated every 21 days  (a) baseline liver MRI 08/02/2017 shows a 1.4 cm mass, "hot" on PET 07/10/2017  (b) repeat liver MRI 11/18/2017 after 3 cycles of T-DM 1, shows slight enlargement  (c) T-DM 1 discontinued after 09/23/2017 dose  (15) trastuzumab resumed 11/18/2017, to be repeated every 4 weeks,   (a) changed back to every 3 weeks in 03/2018  (b) echo 09/18/2018 shows an ejection fraction in the 50-55% range.  (c) pertuzumab added with 10/06/2018 dose  (d) trastuzumab/pertuzumab changed to every 4 weeks after 01/19/2019 dose  (16) liver biopsy 11/03/2017 shows adenocarcinoma, most consistent with an upper gastrointestinal primary; this tumor was estrogen receptor and HER-2 negative  (a) GI workup for occult primary Collene Mares) negative  (b) PET 12/09/2017 shows no obvious primary.  There is a right subpectoral lymph node noted as  well  (c) CA 27-29 and CA 19-9 uninformative; CEA 6.72 OCT 2019   (  17) started Abraxane on 12/111/2019, given days 1 and 8 on a 21 day cycle   (a) restaging with MRI liver 02/24/2018 shows a gratifying initial response  (b) PET scan 05/14/2018 shows resolution of the measurable disease in the liver  (c) liver MRI 09/14/2018 shows no change compared to prior PET and MRI.  (d) Abraxane discontinued after 09/01/2018 dose  (18) consider Yttrium or other local treatment to liver per IR if liver recurrence develops   PLAN: Darlene [name] is now close to 2 years out from definitive diagnosis of metastatic breast cancer.  Her disease appears to be well controlled on her current treatment.  Clinically her biggest problem or concern is the heart and atherosclerotic issue and she is working on this very intensively with Dr. Ander Slade.  She is very motivated.  She is due for restaging.  She will have a repeat liver MRI and also a brain MRI sometime in the next few weeks.  I am hoping for good news on both of them.  She will have a repeat right mammogram in October.  I have encouraged her to do some cardio if she can get a stationary bike or join a gym where she can use their bikes.  I think her current weight is a very good 1 and I have encouraged her to keep it.  She knows to call for any other issues that may develop before her next visit here.  She will see me again in 3 months but I will call her with results of her MRIs as soon as they become available  Total encounter time 30 minutes.Sarajane Jews C. Coreon Simkins, MD  04/13/19 1:30 PM Medical Oncology and Hematology [facility] [place], [place] [address] Tel. [phone]    Fax. [phone]   I, Wilburn Mylar, am acting as scribe for Dr. Virgie Dad. Rosendo Couser.  I, Lurline Del MD, have reviewed the above documentation for accuracy and completeness, and I agree with the above.   *Total Encounter Time as defined by  the Centers for Medicare and Medicaid Services includes, in addition to the face-to-face time of a patient visit (documented in the note above) non-face-to-face time: obtaining and reviewing outside history, ordering and reviewing medications, tests or procedures, care coordination (communications with other health care professionals or caregivers) and documentation in the medical record.

## 2019-04-12 NOTE — Progress Notes (Signed)
Patient  came in to pick up [place] brace w/ dorsi assist springs.  The brace fit well and immediately patient's  gait approved.  The brace provided desired m-l stability in frontal/transverse planes and aided in dorsiflexion in saggital plane. Patient was able to don and doff brace with minimal difficulty.  Overall patient pleased with fit and functionality of brace.  

## 2019-04-13 ENCOUNTER — Inpatient Hospital Stay (HOSPITAL_BASED_OUTPATIENT_CLINIC_OR_DEPARTMENT_OTHER): Payer: Medicare PPO | Admitting: Oncology

## 2019-04-13 ENCOUNTER — Inpatient Hospital Stay: Payer: Medicare PPO

## 2019-04-13 ENCOUNTER — Inpatient Hospital Stay: Payer: Medicare PPO | Attending: Oncology

## 2019-04-13 ENCOUNTER — Other Ambulatory Visit: Payer: Self-pay

## 2019-04-13 ENCOUNTER — Encounter: Payer: Self-pay | Admitting: Certified Nurse Midwife

## 2019-04-13 ENCOUNTER — Other Ambulatory Visit (HOSPITAL_COMMUNITY): Payer: Self-pay | Admitting: Cardiology

## 2019-04-13 VITALS — BP 110/51 | HR 68 | Temp 98.3°F | Resp 18 | Ht 69.0 in | Wt 176.2 lb

## 2019-04-13 DIAGNOSIS — C50411 Malignant neoplasm of upper-outer quadrant of right female breast: Secondary | ICD-10-CM

## 2019-04-13 DIAGNOSIS — Z95828 Presence of other vascular implants and grafts: Secondary | ICD-10-CM

## 2019-04-13 DIAGNOSIS — C801 Malignant (primary) neoplasm, unspecified: Secondary | ICD-10-CM

## 2019-04-13 DIAGNOSIS — Z5112 Encounter for antineoplastic immunotherapy: Secondary | ICD-10-CM | POA: Insufficient documentation

## 2019-04-13 DIAGNOSIS — C50911 Malignant neoplasm of unspecified site of right female breast: Secondary | ICD-10-CM

## 2019-04-13 DIAGNOSIS — C787 Secondary malignant neoplasm of liver and intrahepatic bile duct: Secondary | ICD-10-CM

## 2019-04-13 DIAGNOSIS — Z171 Estrogen receptor negative status [ER-]: Secondary | ICD-10-CM | POA: Insufficient documentation

## 2019-04-13 DIAGNOSIS — K760 Fatty (change of) liver, not elsewhere classified: Secondary | ICD-10-CM | POA: Diagnosis not present

## 2019-04-13 LAB — COMPREHENSIVE METABOLIC PANEL
ALT: 86 U/L — ABNORMAL HIGH (ref 0–44)
AST: 51 U/L — ABNORMAL HIGH (ref 15–41)
Albumin: 4 g/dL (ref 3.5–5.0)
Alkaline Phosphatase: 97 U/L (ref 38–126)
Anion gap: 8 (ref 5–15)
BUN: 39 mg/dL — ABNORMAL HIGH (ref 8–23)
CO2: 22 mmol/L (ref 22–32)
Calcium: 9.2 mg/dL (ref 8.9–10.3)
Chloride: 112 mmol/L — ABNORMAL HIGH (ref 98–111)
Creatinine, Ser: 1.78 mg/dL — ABNORMAL HIGH (ref 0.44–1.00)
GFR calc Af Amer: 34 mL/min — ABNORMAL LOW (ref >60)
GFR calc non Af Amer: 29 mL/min — ABNORMAL LOW (ref >60)
Glucose, Bld: 75 mg/dL (ref 70–99)
Potassium: 4.3 mmol/L (ref 3.5–5.1)
Sodium: 142 mmol/L (ref 135–145)
Total Bilirubin: 0.7 mg/dL (ref 0.3–1.2)
Total Protein: 6.6 g/dL (ref 6.5–8.1)

## 2019-04-13 LAB — CBC WITH DIFFERENTIAL/PLATELET
Abs Immature Granulocytes: 0.02 10*3/uL (ref 0.00–0.07)
Basophils Absolute: 0 10*3/uL (ref 0.0–0.1)
Basophils Relative: 0 %
Eosinophils Absolute: 0.2 10*3/uL (ref 0.0–0.5)
Eosinophils Relative: 3 %
HCT: 33.9 % — ABNORMAL LOW (ref 36.0–46.0)
Hemoglobin: 11.4 g/dL — ABNORMAL LOW (ref 12.0–15.0)
Immature Granulocytes: 0 %
Lymphocytes Relative: 20 %
Lymphs Abs: 1.2 10*3/uL (ref 0.7–4.0)
MCH: 31.6 pg (ref 26.0–34.0)
MCHC: 33.6 g/dL (ref 30.0–36.0)
MCV: 93.9 fL (ref 80.0–100.0)
Monocytes Absolute: 0.5 10*3/uL (ref 0.1–1.0)
Monocytes Relative: 9 %
Neutro Abs: 4 10*3/uL (ref 1.7–7.7)
Neutrophils Relative %: 68 %
Platelets: 167 10*3/uL (ref 150–400)
RBC: 3.61 MIL/uL — ABNORMAL LOW (ref 3.87–5.11)
RDW: 12.7 % (ref 11.5–15.5)
WBC: 5.9 10*3/uL (ref 4.0–10.5)
nRBC: 0 % (ref 0.0–0.2)

## 2019-04-13 LAB — CEA (IN HOUSE-CHCC): CEA (CHCC-In House): 2.41 ng/mL (ref 0.00–5.00)

## 2019-04-13 MED ORDER — ACETAMINOPHEN 325 MG PO TABS
ORAL_TABLET | ORAL | Status: AC
  Filled 2019-04-13: qty 2

## 2019-04-13 MED ORDER — TRASTUZUMAB-ANNS CHEMO 420 MG IV SOLR
6.0000 mg/kg | Freq: Once | INTRAVENOUS | Status: AC
  Administered 2019-04-13: 483 mg via INTRAVENOUS
  Filled 2019-04-13: qty 23

## 2019-04-13 MED ORDER — DIPHENHYDRAMINE HCL 25 MG PO CAPS
25.0000 mg | ORAL_CAPSULE | Freq: Once | ORAL | Status: AC
  Administered 2019-04-13: 25 mg via ORAL

## 2019-04-13 MED ORDER — SODIUM CHLORIDE 0.9 % IV SOLN
Freq: Once | INTRAVENOUS | Status: AC
  Filled 2019-04-13: qty 250

## 2019-04-13 MED ORDER — SODIUM CHLORIDE 0.9% FLUSH
10.0000 mL | INTRAVENOUS | Status: DC | PRN
  Administered 2019-04-13: 10 mL
  Filled 2019-04-13: qty 10

## 2019-04-13 MED ORDER — ACETAMINOPHEN 325 MG PO TABS
650.0000 mg | ORAL_TABLET | Freq: Once | ORAL | Status: AC
  Administered 2019-04-13: 650 mg via ORAL

## 2019-04-13 MED ORDER — HEPARIN SOD (PORK) LOCK FLUSH 100 UNIT/ML IV SOLN
500.0000 [IU] | Freq: Once | INTRAVENOUS | Status: AC | PRN
  Administered 2019-04-13: 500 [IU]
  Filled 2019-04-13: qty 5

## 2019-04-13 MED ORDER — SODIUM CHLORIDE 0.9 % IV SOLN
420.0000 mg | Freq: Once | INTRAVENOUS | Status: AC
  Administered 2019-04-13: 15:00:00 420 mg via INTRAVENOUS
  Filled 2019-04-13: qty 14

## 2019-04-13 MED ORDER — SODIUM CHLORIDE 0.9% FLUSH
10.0000 mL | INTRAVENOUS | Status: DC | PRN
  Administered 2019-04-13: 10 mL via INTRAVENOUS
  Filled 2019-04-13: qty 10

## 2019-04-13 MED ORDER — DIPHENHYDRAMINE HCL 25 MG PO CAPS
ORAL_CAPSULE | ORAL | Status: AC
  Filled 2019-04-13: qty 1

## 2019-04-13 MED ORDER — HEPARIN SOD (PORK) LOCK FLUSH 100 UNIT/ML IV SOLN
500.0000 [IU] | Freq: Once | INTRAVENOUS | Status: DC | PRN
  Filled 2019-04-13: qty 5

## 2019-04-14 ENCOUNTER — Telehealth: Payer: Self-pay | Admitting: Oncology

## 2019-04-14 DIAGNOSIS — M25675 Stiffness of left foot, not elsewhere classified: Secondary | ICD-10-CM | POA: Diagnosis not present

## 2019-04-14 DIAGNOSIS — M25674 Stiffness of right foot, not elsewhere classified: Secondary | ICD-10-CM | POA: Diagnosis not present

## 2019-04-14 DIAGNOSIS — M25671 Stiffness of right ankle, not elsewhere classified: Secondary | ICD-10-CM | POA: Diagnosis not present

## 2019-04-14 DIAGNOSIS — M62571 Muscle wasting and atrophy, not elsewhere classified, right ankle and foot: Secondary | ICD-10-CM | POA: Diagnosis not present

## 2019-04-14 DIAGNOSIS — M62572 Muscle wasting and atrophy, not elsewhere classified, left ankle and foot: Secondary | ICD-10-CM | POA: Diagnosis not present

## 2019-04-14 DIAGNOSIS — M6281 Muscle weakness (generalized): Secondary | ICD-10-CM | POA: Diagnosis not present

## 2019-04-14 DIAGNOSIS — R262 Difficulty in walking, not elsewhere classified: Secondary | ICD-10-CM | POA: Diagnosis not present

## 2019-04-14 DIAGNOSIS — M25672 Stiffness of left ankle, not elsewhere classified: Secondary | ICD-10-CM | POA: Diagnosis not present

## 2019-04-14 NOTE — Telephone Encounter (Signed)
R/s appt on 6/15 due to 3/23 los - added MD appt before chemo . Pt to get an updated schedule next visit.

## 2019-04-15 ENCOUNTER — Encounter (INDEPENDENT_AMBULATORY_CARE_PROVIDER_SITE_OTHER): Payer: Self-pay | Admitting: Family Medicine

## 2019-04-15 ENCOUNTER — Other Ambulatory Visit (INDEPENDENT_AMBULATORY_CARE_PROVIDER_SITE_OTHER): Payer: Self-pay

## 2019-04-15 DIAGNOSIS — E119 Type 2 diabetes mellitus without complications: Secondary | ICD-10-CM

## 2019-04-15 DIAGNOSIS — Z794 Long term (current) use of insulin: Secondary | ICD-10-CM

## 2019-04-15 MED ORDER — ACCU-CHEK GUIDE CONTROL VI LIQD: 1.0000 | 0 refills | Status: DC | PRN

## 2019-04-19 ENCOUNTER — Ambulatory Visit (INDEPENDENT_AMBULATORY_CARE_PROVIDER_SITE_OTHER): Payer: Medicare PPO | Admitting: Family Medicine

## 2019-04-19 ENCOUNTER — Encounter (INDEPENDENT_AMBULATORY_CARE_PROVIDER_SITE_OTHER): Payer: Self-pay | Admitting: Family Medicine

## 2019-04-19 ENCOUNTER — Other Ambulatory Visit: Payer: Self-pay

## 2019-04-19 VITALS — BP 116/69 | HR 69 | Temp 98.0°F | Ht 69.0 in | Wt 168.0 lb

## 2019-04-19 DIAGNOSIS — F3289 Other specified depressive episodes: Secondary | ICD-10-CM

## 2019-04-19 DIAGNOSIS — E669 Obesity, unspecified: Secondary | ICD-10-CM

## 2019-04-19 DIAGNOSIS — I152 Hypertension secondary to endocrine disorders: Secondary | ICD-10-CM

## 2019-04-19 DIAGNOSIS — E1159 Type 2 diabetes mellitus with other circulatory complications: Secondary | ICD-10-CM | POA: Diagnosis not present

## 2019-04-19 DIAGNOSIS — Z6830 Body mass index (BMI) 30.0-30.9, adult: Secondary | ICD-10-CM

## 2019-04-19 DIAGNOSIS — Z794 Long term (current) use of insulin: Secondary | ICD-10-CM

## 2019-04-19 DIAGNOSIS — E785 Hyperlipidemia, unspecified: Secondary | ICD-10-CM | POA: Diagnosis not present

## 2019-04-19 DIAGNOSIS — I10 Essential (primary) hypertension: Secondary | ICD-10-CM

## 2019-04-19 DIAGNOSIS — E1121 Type 2 diabetes mellitus with diabetic nephropathy: Secondary | ICD-10-CM | POA: Diagnosis not present

## 2019-04-19 DIAGNOSIS — E1169 Type 2 diabetes mellitus with other specified complication: Secondary | ICD-10-CM

## 2019-04-19 MED ORDER — VORTIOXETINE HBR 20 MG PO TABS: 20.0000 mg | ORAL_TABLET | Freq: Every day | ORAL | 0 refills | Status: DC

## 2019-04-19 MED ORDER — LOSARTAN POTASSIUM 100 MG PO TABS: 100.0000 mg | ORAL_TABLET | Freq: Every day | ORAL | 0 refills | Status: DC

## 2019-04-19 MED ORDER — BUPROPION HCL ER (SR) 200 MG PO TB12: ORAL_TABLET | ORAL | 0 refills | Status: DC

## 2019-04-19 MED ORDER — VICTOZA 18 MG/3ML ~~LOC~~ SOPN: PEN_INJECTOR | SUBCUTANEOUS | 0 refills | Status: DC

## 2019-04-19 MED ORDER — LANTUS SOLOSTAR 100 UNIT/ML ~~LOC~~ SOPN: 12.0000 [IU] | PEN_INJECTOR | Freq: Every day | SUBCUTANEOUS | 0 refills | Status: DC

## 2019-04-19 MED ORDER — TOPIRAMATE 25 MG PO TABS: 25.0000 mg | ORAL_TABLET | Freq: Every day | ORAL | 0 refills | Status: DC

## 2019-04-19 NOTE — Progress Notes (Signed)
Chief Complaint:   OBESITY Darlene [name] is here to discuss her progress with her obesity treatment plan along with follow-up of her obesity related diagnoses. Darlene [name] is on keeping a food journal and adhering to recommended goals of 1500 calories and 75+ grams of protein daily and states she is following her eating plan approximately 60% of the time. Darlene [name] states she is doing 0 minutes 0 times per week.  Today's visit was #: 45 Starting weight: 225 lbs Starting date: 08/09/2016 Today's weight: 168 lbs Today's date: 04/19/2019 Total lbs lost to date: 57 Total lbs lost since last in-office visit: 9  Interim History:  She has had both COVID-19 vaccinations. In attempt to lower her cholesterol she has dramatically reduced saturated fat intake. Per her Cardiologist, her LDL goal is <50. She needs to change focus to weight maintenance, and not to lose anymore weight.  Subjective:   1. Type 2 diabetes mellitus with diabetic nephropathy, with long-term current use of insulin ([place]) Darlene [name]'s last A1c was 5.5 on 01/25/2019. She keeps her sugar intake to <100 grams daily. Due to decreased GFR, she stopped metformin.  2. Other depression, with emotional eating Darlene [name]'s mood is stable.  3. Hypertension associated with type 2 diabetes mellitus ([place]) Darlene [name]'s blood pressure is well controlled on ARB and BB.  4. Hyperlipidemia associated with type 2 diabetes mellitus ([place]) Darlene [name] reports per her Cardiologist, wants her LDL <50 due to results of coronary artery calcium score. She is currently on rosuvastatin 10 mg q daily.  Assessment/Plan:   1. Type 2 diabetes mellitus with diabetic nephropathy, with long-term current use of insulin (H[facility]) Good blood sugar control is important to decrease the likelihood of diabetic complications such as nephropathy, neuropathy, limb loss, blindness, coronary artery disease, and death. Intensive lifestyle modification including diet, exercise and weight loss  are the first line of treatment for diabetes. Darlene [name] will increase her calories to 1600-1800 calories per day. We will refill Lantus and Victotza for 90 days with no refills.  - insulin glargine (LANTUS SOLOSTAR) 100 UNIT/ML Solostar Pen; Inject 12 Units into the skin daily.  Dispense: 15 pen; Refill: 0 - liraglutide (VICTOZA) 18 MG/3ML SOPN; INJECT 1.8MG (0.3 MLS) INTO THE SKIN EVERY MORNING  Dispense: 9 pen; Refill: 0  2. Other depression, with emotional eating Behavior modification techniques were discussed today to help [name] deal with her emotional/non-hunger eating behaviors. We will refill bupropion, Topamax, and Trintellix for 90 days with no refills. Orders and follow up as documented in patient record.   - buPROPion (WELLBUTRIN SR) 200 MG 12 hr tablet; TAKE 1 TABLET AT 12 NOON.  Dispense: 90 tablet; Refill: 0 - topiramate (TOPAMAX) 25 MG tablet; Take 1 tablet (25 mg total) by mouth daily.  Dispense: 90 tablet; Refill: 0 - vortioxetine HBr (TRINTELLIX) 20 MG TABS tablet; Take 1 tablet (20 mg total) by mouth at bedtime.  Dispense: 90 tablet; Refill: 0  3. Hypertension associated with type 2 diabetes mellitus ([place]) [name] will increase her calories to 1600-1800 calories per day. She will continue working on healthy weight loss and exercise to improve blood pressure control. We will watch for signs of hypotension as she continues her lifestyle modifications. We will refill losartan for 90 days with no refills.  - losartan (COZAAR) 100 MG tablet; Take 1 tablet (100 mg total) by mouth daily.  Dispense: 90 tablet; Refill: 0  4. Hyperlipidemia associated with type 2 diabetes mellitus ([place]) Cardiovascular risk and specific lipid/LDL goals reviewed. We discussed several  lifestyle modifications today and [name] will continue to work on diet, exercise and weight loss efforts. Raylei agreed to continue her statin therapy. Orders and follow up as documented in patient record.   5. Class 1  obesity with serious comorbidity and body mass index (BMI) of 30.0 to 30.9 in adult, unspecified obesity type Darlene [name] is currently in the action stage of change. As such, her goal is to maintain weight for now. She has agreed to keeping a food journal and adhering to recommended goals of 1600-1800 calories and 85 grams of protein daily.   Behavioral modification strategies: no skipping meals.  Darlene [name] has agreed to follow-up with our clinic in 8 weeks. She was informed of the importance of frequent follow-up visits to maximize her success with intensive lifestyle modifications for her multiple health conditions.   Objective:   Blood pressure 116/69, pulse 69, temperature 98 F (36.7 C), temperature source Oral, height 5\' 9"  (1.753 m), weight 168 lb (76.2 kg), last menstrual period 10/22/2007, SpO2 98 %. Body mass index is 24.81 kg/m.  General: Cooperative, alert, well developed, in no acute distress. HEENT: Conjunctivae and lids unremarkable. Cardiovascular: Regular rhythm.  Lungs: Normal work of breathing. Neurologic: No focal deficits.   Lab Results  Component Value Date   CREATININE 1.78 (H) 04/13/2019   BUN 39 (H) 04/13/2019   NA 142 04/13/2019   K 4.3 04/13/2019   CL 112 (H) 04/13/2019   CO2 22 04/13/2019   Lab Results  Component Value Date   ALT 86 (H) 04/13/2019   AST 51 (H) 04/13/2019   ALKPHOS 97 04/13/2019   BILITOT 0.7 04/13/2019   Lab Results  Component Value Date   HGBA1C 5.5 01/25/2019   HGBA1C 5.4 08/27/2018   HGBA1C 5.8 (H) 09/03/2017   HGBA1C 6.1 (H) 03/11/2017   HGBA1C 8.2 (H) 11/05/2016   Lab Results  Component Value Date   INSULIN 7.6 01/25/2019   INSULIN 15.3 03/11/2017   INSULIN 18.3 08/09/2016   Lab Results  Component Value Date   TSH 1.54 02/07/2017   Lab Results  Component Value Date   CHOL 154 01/25/2019   HDL 62 01/25/2019   LDLCALC 75 01/25/2019   TRIG 90 01/25/2019   CHOLHDL 2.9 02/18/2013   Lab Results  Component Value  Date   WBC 5.9 04/13/2019   HGB 11.4 (L) 04/13/2019   HCT 33.9 (L) 04/13/2019   MCV 93.9 04/13/2019   PLT 167 04/13/2019   Lab Results  Component Value Date   IRON 58 02/18/2013   TIBC 305 02/18/2013   FERRITIN 133 10/31/2014    Obesity Behavioral Intervention Documentation for Insurance:   Approximately 15 minutes were spent on the discussion below.  ASK: We discussed the diagnosis of obesity with [name] today and Melenie agreed to give [name] permission to discuss obesity behavioral modification therapy today.  ASSESS: Storey has the diagnosis of obesity and her BMI today is 24.8. Zoe is in the action stage of change.   ADVISE: Zanovia was educated on the multiple health risks of obesity as well as the benefit of weight loss to improve her health. She was advised of the need for long term treatment and the importance of lifestyle modifications to improve her current health and to decrease her risk of future health problems.  AGREE: Multiple dietary modification options and treatment options were discussed and Verdine agreed to follow the recommendations documented in the above note.  ARRANGE: Reisha was educated on the importance of frequent visits  to treat obesity as outlined per CMS and USPSTF guidelines and agreed to schedule her next follow up appointment today.  Attestation Statements:   Reviewed by clinician on day of visit: allergies, medications, problem list, medical history, surgical history, family history, social history, and previous encounter notes.   I, [name], am acting as transcriptionist for Dennard Nip, MD.  I have reviewed the above documentation for accuracy and completeness, and I agree with the above. -  Dennard Nip, MD

## 2019-04-27 ENCOUNTER — Other Ambulatory Visit: Payer: Self-pay

## 2019-04-27 ENCOUNTER — Ambulatory Visit (HOSPITAL_COMMUNITY)
Admission: RE | Admit: 2019-04-27 | Discharge: 2019-04-27 | Disposition: A | Discharge status: Home / Self Care | Payer: Medicare PPO | Source: Ambulatory Visit | Attending: Oncology | Admitting: Oncology

## 2019-04-27 ENCOUNTER — Encounter: Payer: Self-pay | Admitting: Oncology

## 2019-04-27 DIAGNOSIS — K760 Fatty (change of) liver, not elsewhere classified: Secondary | ICD-10-CM

## 2019-04-27 DIAGNOSIS — C50911 Malignant neoplasm of unspecified site of right female breast: Secondary | ICD-10-CM | POA: Diagnosis not present

## 2019-04-27 DIAGNOSIS — C50411 Malignant neoplasm of upper-outer quadrant of right female breast: Secondary | ICD-10-CM | POA: Diagnosis not present

## 2019-04-27 DIAGNOSIS — C50919 Malignant neoplasm of unspecified site of unspecified female breast: Secondary | ICD-10-CM | POA: Diagnosis not present

## 2019-04-27 DIAGNOSIS — C787 Secondary malignant neoplasm of liver and intrahepatic bile duct: Secondary | ICD-10-CM

## 2019-04-27 DIAGNOSIS — Z853 Personal history of malignant neoplasm of breast: Secondary | ICD-10-CM | POA: Diagnosis not present

## 2019-04-27 DIAGNOSIS — C801 Malignant (primary) neoplasm, unspecified: Secondary | ICD-10-CM | POA: Insufficient documentation

## 2019-04-27 LAB — POCT I-STAT CREATININE: Creatinine, Ser: 1.9 mg/dL — ABNORMAL HIGH (ref 0.44–1.00)

## 2019-04-27 MED ORDER — GADOBUTROL 1 MMOL/ML IV SOLN
7.0000 mL | Freq: Once | INTRAVENOUS | Status: AC | PRN
  Administered 2019-04-27: 7 mL via INTRAVENOUS

## 2019-04-28 DIAGNOSIS — E113513 Type 2 diabetes mellitus with proliferative diabetic retinopathy with macular edema, bilateral: Secondary | ICD-10-CM | POA: Diagnosis not present

## 2019-04-28 DIAGNOSIS — H35371 Puckering of macula, right eye: Secondary | ICD-10-CM | POA: Diagnosis not present

## 2019-04-28 DIAGNOSIS — H43811 Vitreous degeneration, right eye: Secondary | ICD-10-CM | POA: Diagnosis not present

## 2019-05-02 ENCOUNTER — Emergency Department (HOSPITAL_COMMUNITY): Payer: Medicare PPO

## 2019-05-02 ENCOUNTER — Emergency Department (HOSPITAL_COMMUNITY)
Admission: EM | Admit: 2019-05-02 | Discharge: 2019-05-02 | Disposition: A | Discharge status: Home / Self Care | Payer: Medicare PPO | Attending: Emergency Medicine | Admitting: Emergency Medicine

## 2019-05-02 ENCOUNTER — Other Ambulatory Visit: Payer: Self-pay

## 2019-05-02 ENCOUNTER — Encounter (HOSPITAL_COMMUNITY): Payer: Self-pay | Admitting: Emergency Medicine

## 2019-05-02 DIAGNOSIS — S50312A Abrasion of left elbow, initial encounter: Secondary | ICD-10-CM | POA: Diagnosis not present

## 2019-05-02 DIAGNOSIS — Y999 Unspecified external cause status: Secondary | ICD-10-CM | POA: Insufficient documentation

## 2019-05-02 DIAGNOSIS — Y92009 Unspecified place in unspecified non-institutional (private) residence as the place of occurrence of the external cause: Secondary | ICD-10-CM | POA: Insufficient documentation

## 2019-05-02 DIAGNOSIS — C787 Secondary malignant neoplasm of liver and intrahepatic bile duct: Secondary | ICD-10-CM | POA: Diagnosis not present

## 2019-05-02 DIAGNOSIS — S52572A Other intraarticular fracture of lower end of left radius, initial encounter for closed fracture: Secondary | ICD-10-CM

## 2019-05-02 DIAGNOSIS — S52612A Displaced fracture of left ulna styloid process, initial encounter for closed fracture: Secondary | ICD-10-CM | POA: Diagnosis not present

## 2019-05-02 DIAGNOSIS — Z7982 Long term (current) use of aspirin: Secondary | ICD-10-CM | POA: Diagnosis not present

## 2019-05-02 DIAGNOSIS — W19XXXA Unspecified fall, initial encounter: Secondary | ICD-10-CM

## 2019-05-02 DIAGNOSIS — E119 Type 2 diabetes mellitus without complications: Secondary | ICD-10-CM | POA: Insufficient documentation

## 2019-05-02 DIAGNOSIS — S52571A Other intraarticular fracture of lower end of right radius, initial encounter for closed fracture: Secondary | ICD-10-CM | POA: Insufficient documentation

## 2019-05-02 DIAGNOSIS — Z794 Long term (current) use of insulin: Secondary | ICD-10-CM | POA: Diagnosis not present

## 2019-05-02 DIAGNOSIS — S6992XA Unspecified injury of left wrist, hand and finger(s), initial encounter: Secondary | ICD-10-CM | POA: Diagnosis present

## 2019-05-02 DIAGNOSIS — Y9389 Activity, other specified: Secondary | ICD-10-CM | POA: Insufficient documentation

## 2019-05-02 DIAGNOSIS — M7989 Other specified soft tissue disorders: Secondary | ICD-10-CM | POA: Diagnosis not present

## 2019-05-02 DIAGNOSIS — S199XXA Unspecified injury of neck, initial encounter: Secondary | ICD-10-CM | POA: Diagnosis not present

## 2019-05-02 DIAGNOSIS — M25521 Pain in right elbow: Secondary | ICD-10-CM | POA: Diagnosis not present

## 2019-05-02 DIAGNOSIS — W109XXA Fall (on) (from) unspecified stairs and steps, initial encounter: Secondary | ICD-10-CM | POA: Insufficient documentation

## 2019-05-02 DIAGNOSIS — S0990XA Unspecified injury of head, initial encounter: Secondary | ICD-10-CM

## 2019-05-02 DIAGNOSIS — I10 Essential (primary) hypertension: Secondary | ICD-10-CM | POA: Insufficient documentation

## 2019-05-02 DIAGNOSIS — S50311A Abrasion of right elbow, initial encounter: Secondary | ICD-10-CM | POA: Diagnosis not present

## 2019-05-02 DIAGNOSIS — Z79899 Other long term (current) drug therapy: Secondary | ICD-10-CM | POA: Diagnosis not present

## 2019-05-02 DIAGNOSIS — Z853 Personal history of malignant neoplasm of breast: Secondary | ICD-10-CM | POA: Insufficient documentation

## 2019-05-02 DIAGNOSIS — R609 Edema, unspecified: Secondary | ICD-10-CM | POA: Diagnosis not present

## 2019-05-02 DIAGNOSIS — S59902A Unspecified injury of left elbow, initial encounter: Secondary | ICD-10-CM | POA: Diagnosis not present

## 2019-05-02 DIAGNOSIS — S0003XA Contusion of scalp, initial encounter: Secondary | ICD-10-CM | POA: Insufficient documentation

## 2019-05-02 DIAGNOSIS — T07XXXA Unspecified multiple injuries, initial encounter: Secondary | ICD-10-CM | POA: Diagnosis not present

## 2019-05-02 DIAGNOSIS — R220 Localized swelling, mass and lump, head: Secondary | ICD-10-CM | POA: Diagnosis not present

## 2019-05-02 DIAGNOSIS — S52592A Other fractures of lower end of left radius, initial encounter for closed fracture: Secondary | ICD-10-CM | POA: Diagnosis not present

## 2019-05-02 LAB — CBG MONITORING, ED: Glucose-Capillary: 92 mg/dL (ref 70–99)

## 2019-05-02 MED ORDER — BACITRACIN ZINC 500 UNIT/GM EX OINT
TOPICAL_OINTMENT | CUTANEOUS | Status: AC
  Administered 2019-05-02: 1
  Filled 2019-05-02: qty 5.4

## 2019-05-02 MED ORDER — BUPIVACAINE HCL (PF) 0.5 % IJ SOLN
20.0000 mL | Freq: Once | INTRAMUSCULAR | Status: DC
  Filled 2019-05-02: qty 30

## 2019-05-02 MED ORDER — BACITRACIN ZINC 500 UNIT/GM EX OINT
TOPICAL_OINTMENT | CUTANEOUS | Status: AC
  Administered 2019-05-02: 1
  Filled 2019-05-02: qty 1.8

## 2019-05-02 MED ORDER — HYDROCODONE-ACETAMINOPHEN 5-325 MG PO TABS: 1.0000 | ORAL_TABLET | Freq: Four times a day (QID) | ORAL | 0 refills | Status: DC | PRN

## 2019-05-02 NOTE — ED Notes (Signed)
RN assisted patient to the restroom to urinate. Rn wiped patient and assisted patient back to bed.

## 2019-05-02 NOTE — Discharge Instructions (Addendum)
Fracture care There is evidence of fractures on the x-ray. Pain:  Acetaminophen: May take acetaminophen (generic for Tylenol), as needed, for pain. Your daily total maximum amount of acetaminophen from all sources should be limited to 4000mg /day for persons without liver problems, or 2000mg /day for those with liver problems. Vicodin: May take Vicodin (hydrocodone-acetaminophen) as needed for severe pain.   Do not drive or perform other dangerous activities while taking this medication as it can cause drowsiness as well as changes in reaction time and judgement.   Please note that each pill of Vicodin contains 325 mg of acetaminophen (generic for Tylenol) and the above dosage limits apply. Ice: May apply ice to the injured area for no more than 15 minutes at a time to reduce swelling and pain. Elevation: Keep the extremity elevated whenever possible to reduce swelling and pain. Splint: Keep the splint clean and dry.  Protect it from water during bathing.  If the splint gets wet, you will need to have it reapplied.  Do not leave a wet splint against the skin as this can cause skin breakdown.  Call the orthopedist office or come to the ED for splint replacement, if needed.  Follow-up: The orthopedic specialist would like to see you in the office tomorrow morning, Monday, April 12, at 8:30 AM. Return: Return to the emergency department for severely increased pain, numbness, blanching of the skin, or any other major concerns.

## 2019-05-02 NOTE — ED Provider Notes (Signed)
[facility] DEPT Provider Note   CSN: [id number] Arrival date & time: 05/02/19  1110     History Chief Complaint  Patient presents with  . Fall  . Arm Injury    left    Darlene [name] is a 69 y.o. female.  The history is provided by the spouse and the patient.        Darlene [name] is a 69 y.o. female, with a history of breast cancer, anemia, anxiety, DM, lower extremity neuropathy with dropfoot, presenting to the ED with injuries from a fall that occurred around 10:30 AM this morning. Patient states she stepped up one step, lost her balance, and fell backward.  She remembers falling. She struck the back of her head and notes injuries to her bilateral elbows and bilateral wrists.  Patient's husband is at the bedside and was able to provide more information.  He states patient was confused for several minutes following the fall, but then returned to baseline.  He denies witnessing any seizure activity. She does have active metastatic breast cancer, however, is not currently under radiation or chemo treatment.  Denies anticoagulation.  Patient states she has dropfoot on the left due to neuropathy.  She wears a brace on the left ankle.  She states similar symptoms are developing on the right.  She typically walks with a cane, but was not using the cane at the time of the fall.  Denies headache, vision abnormalities, neck/back pain, chest pain, shortness of breath, abdominal pain, joint pain other than that mentioned above, acute numbness, weakness, nausea/vomiting, or any other complaints.   Past Medical History:  Diagnosis Date  . Anemia    hx of - during 1st round chemo  . Anxiety   . Arthritis    in fingers, shoulders  . Back pain   . Balance problem   . Breast cancer ([place])   . Breast cancer of upper-outer quadrant of right female breast ([place]) 07/22/2014  . Bunion, right foot   . Clumsiness   . Constipation   . Depression   .  Diabetes mellitus without complication ([place])    32+ years  . Diabetic retinopathy ([place])    torn retina  . Eating disorder    binge eating  . Epistaxis 08/26/2014  . Fatty liver   . HCAP (healthcare-associated pneumonia) 12/18/2014  . Headache   . Heart murmur    never had any problems  . History of radiation therapy 04/05/15-05/19/15   right chest wall was treated to 50.4 Gy in 28 fractions, right mastectomy scar was treated to 10 Gy in 5 fractions  . Hyperlipidemia   . Hypertension   . Joint pain   . Multiple food allergies   . Neuromuscular disorder ([place])    diabetic neuropathy  . Obesity   . Obstructive sleep apnea on CPAP    uses cpap nightly  . Ovarian cyst rupture    possible  . Pneumonia   . Pneumonia 11/2014  . Radiation 04/05/15-05/19/15   right chest wall 50.4 Gy, mastectomy scar 10 Gy  . Sleep apnea   . Thyroid nodule 06/2014    Patient Active Problem List   Diagnosis Date Noted  . Hammer toe of second toe of right foot 11/16/2018  . Hallux valgus of right foot 11/16/2018  . Essential hypertension 11/04/2018  . Pain due to onychomycosis of toenails of both feet 10/14/2018  . Pre-ulcerative calluses 10/14/2018  . Diabetic retinopathy of both eyes associated  with type 2 diabetes mellitus ([place]) 06/28/2018  . Goals of care, counseling/discussion 12/30/2017  . Occult malignancy ([place]) 12/01/2017  . Neoplastic malignant related fatigue 09/29/2017  . Metastasis to liver ([place]) 08/04/2017  . Lesion of liver 07/30/2017  . Type 2 diabetes mellitus without complication, with long-term current use of insulin ([place]) 03/11/2017  . Vitamin D deficiency 02/24/2017  . Aortic atherosclerosis ([place]) 01/28/2017  . Type 2 diabetes mellitus without complication, without long-term current use of insulin ([place]) 12/18/2016  . Class 1 obesity with serious comorbidity and body mass index (BMI) of 30.0 to 30.9 in adult 12/18/2016  . Port catheter in place 08/21/2015  . Acute appendicitis  07/22/2015  . Appendicitis with peritonitis 07/22/2015  . Acquired absence of right breast and nipple 07/05/2015  . History of therapeutic radiation 07/05/2015  . Hepatic steatosis 05/29/2015  . Recurrent breast cancer, right ([place]) 02/15/2015  . Chemotherapy-induced peripheral neuropathy ([place]) 02/08/2015  . Macromastia 01/06/2015  . Anemia in neoplastic disease 12/21/2014  . Cough   . Breast cancer of upper-outer quadrant of right female breast ([place]) 07/22/2014  . OSA on CPAP 06/02/2013  . Type 2 diabetes mellitus with diabetic neuropathy, with long-term current use of insulin ([place]) 06/02/2013  . 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 Depression 09/10/2012  . hyperlipidemia 09/10/2012    Past Surgical History:  Procedure Laterality Date  . APPENDECTOMY    . BIOPSY THYROID Bilateral 06/2014   2 nodules - Benign   . BREAST LUMPECTOMY WITH RADIOACTIVE SEED AND SENTINEL LYMPH NODE BIOPSY Right 12/27/2014   Procedure: BREAST LUMPECTOMY WITH RADIOACTIVE SEED AND SENTINEL LYMPH NODE BIOPSY;  Surgeon: Stark Klein, MD;  Location: [facility];  Service: General;  Laterality: Right;  . BREAST REDUCTION SURGERY Bilateral 01/06/2015   Procedure: BILATERAL BREAST REDUCTION, RIGHT ONCOPLASTIC RECONSTRUCTION),LEFT BREAST REDUCTION FOR SYMMETRY;  Surgeon: [name], MD;  Location: [facility];  Service: Plastics;  Laterality: Bilateral;  . BREATH TEK H PYLORI N/A 09/15/2012   Procedure: BREATH TEK H PYLORI;  Surgeon: Pedro Earls, MD;  Location: [facility];  Service: General;  Laterality: N/A;  . BUNIONECTOMY Left 12/2013  . CARPAL TUNNEL RELEASE Right   . CARPAL TUNNEL RELEASE Left   . CATARACT EXTRACTION     6/19  . COLONOSCOPY    . DUPUYTREN CONTRACTURE RELEASE    . GASTRIC ROUX-EN-Y N/A 11/02/2012   Procedure: LAPAROSCOPIC ROUX-EN-Y GASTRIC;  Surgeon: Pedro Earls, MD;  Location: [facility];  Service: General;  Laterality: N/A;  . IR GENERIC HISTORICAL  03/18/2016    IR US GUIDE VASC ACCESS LEFT 03/18/2016 Markus Daft, MD [facility]  . IR GENERIC HISTORICAL  03/18/2016   IR FLUORO GUIDE CV LINE LEFT 03/18/2016 Markus Daft, MD [facility]  . LAPAROSCOPIC APPENDECTOMY N/A 07/22/2015   Procedure: APPENDECTOMY LAPAROSCOPIC;  Surgeon: Michael Boston, MD;  Location: [facility];  Service: General;  Laterality: N/A;  . MASTECTOMY Right 02/15/2015   modified  . MODIFIED MASTECTOMY Right 02/15/2015   Procedure: RIGHT MODIFIED RADICAL MASTECTOMY;  Surgeon: Stark Klein, MD;  Location: [facility];  Service: General;  Laterality: Right;  . PORT-A-CATH REMOVAL Left 10/10/2015   Procedure: PORT REMOVAL;  Surgeon: Stark Klein, MD;  Location: [facility];  Service: General;  Laterality: Left;  . PORTACATH PLACEMENT Left 08/02/2014   Procedure: INSERTION PORT-A-CATH;  Surgeon: Stark Klein, MD;  Location: [facility];  Service: General;  Laterality: Left;  . PORTACATH PLACEMENT N/A 11/05/2016  Procedure: INSERTION PORT-A-CATH;  Surgeon: Stark Klein, MD;  Location: [facility];  Service: General;  Laterality: N/A;  . RETINAL LASER PROCEDURE     retina tear on left eye  . ROTATOR CUFF REPAIR Right 2005  . SCAR REVISION Right 12/08/2015   Procedure: COMPLEX REPAIR RIGHT CHEST 10-15CM;  Surgeon: [name], MD;  Location: [facility];  Service: Plastics;  Laterality: Right;  . TOE SURGERY  1970s   bone spur  . TRIGGER FINGER RELEASE     x3     OB History    Gravida  1   Para  0   Term  0   Preterm  0   AB  1   Living  0     SAB  0   TAB  0   Ectopic  0   Multiple  0   Live Births  0           Family History  Problem Relation Age of Onset  . CVA Mother   . Heart disease Mother   . Sudden death Mother   . Diabetes Father   . Heart failure Father   . Hypertension Father   . Kidney disease Father   . Obesity Father   . Hypertension Sister   . Diabetes Brother   . Breast cancer Paternal Grandmother 5  . Diabetes Paternal Uncle    . Ovarian cancer Neg Hx     Social History   Tobacco Use  . Smoking status: Never Smoker  . Smokeless tobacco: Never Used  Substance Use Topics  . Alcohol use: Yes    Alcohol/week: 1.0 - 2.0 standard drinks    Types: 1 - 2 Standard drinks or equivalent per week    Comment: social  . Drug use: No    Home Medications Prior to Admission medications   Medication Sig Start Date End Date Taking? Authorizing Provider  acetaminophen (TYLENOL) 325 MG tablet Take 650 mg by mouth 2 (two) times daily as needed for moderate pain or headache.    Yes [provider]  aspirin EC 81 MG tablet Take 1 tablet (81 mg total) by mouth daily. 03/18/19  Yes Larey Dresser, MD  buPROPion (WELLBUTRIN SR) 200 MG 12 hr tablet TAKE 1 TABLET AT 12 NOON. Patient taking differently: Take 200 mg by mouth at bedtime.  04/19/19  Yes Beasley, Caren D, MD  CALCIUM CITRATE-VITAMIN D PO Take 1 tablet by mouth 3 (three) times daily.    Yes [provider]  carvedilol (COREG) 3.125 MG tablet TAKE 1 TABLET BY MOUTH TWICE DAILY. 04/13/19  Yes Larey Dresser, MD  cholecalciferol (VITAMIN D) 1000 units tablet Take 1,000 Units by mouth daily.   Yes [provider]  cholestyramine (QUESTRAN) 4 g packet Take 1 packet (4 g total) by mouth 2 (two) times daily as needed. 09/15/18  Yes Magrinat, Virgie Dad, MD  Cyanocobalamin (VITAMIN B-12 PO) Take 1 tablet by mouth every 14 (fourteen) days.   Yes [provider]  diphenhydrAMINE (BENADRYL) 25 MG tablet Take 25 mg by mouth daily as needed for allergies or sleep.    Yes [provider]  insulin glargine (LANTUS SOLOSTAR) 100 UNIT/ML Solostar Pen Inject 12 Units into the skin daily. 04/19/19  Yes Leafy Ro, Caren D, MD  liraglutide (VICTOZA) 18 MG/3ML SOPN INJECT 1.8MG(0.3 MLS) INTO THE SKIN EVERY MORNING 04/19/19  Yes Leafy Ro, Caren D, MD  loratadine (CLARITIN) 10 MG tablet Take 10 mg by  mouth as needed for allergies.   Yes [provider]  losartan (COZAAR) 100 MG tablet Take 1 tablet (100 mg total) by mouth daily. 04/19/19  Yes Dennard Nip D, MD  Pediatric Multivitamins-Iron (FLINTSTONES PLUS IRON) chewable tablet Chew 1 tablet by mouth 2 (two) times daily.   Yes [provider]  Probiotic Product ([employer] PROBIOTIC PO) Take 1 capsule by mouth daily.    Yes [provider]  prochlorperazine (COMPAZINE) 10 MG tablet Take 1 tablet (10 mg total) by mouth every 6 (six) hours as needed for nausea or vomiting. 12/30/17  Yes Magrinat, Virgie Dad, MD  Propylene Glycol (SYSTANE BALANCE OP) Place 1-2 drops into both eyes 3 (three) times daily as needed (for dry eyes).    Yes [provider]  rosuvastatin (CRESTOR) 10 MG tablet Take 1 tablet (10 mg total) by mouth daily. 03/18/19 06/16/19 Yes Larey Dresser, MD  senna-docusate (SENNA PLUS) 8.6-50 MG tablet Take 1 tablet by mouth daily as needed for moderate constipation.    Yes [provider]  topiramate (TOPAMAX) 25 MG tablet Take 1 tablet (25 mg total) by mouth daily. 04/19/19  Yes Beasley, Caren D, MD  vortioxetine HBr (TRINTELLIX) 20 MG TABS tablet Take 1 tablet (20 mg total) by mouth at bedtime. 04/19/19  Yes Beasley, Caren D, MD  Accu-Chek Softclix Lancets lancets 1 each by Other route 4 (four) times daily. Test blood sugars 4 x times daily 03/08/19   Dennard Nip D, MD  Blood Glucose Calibration (ACCU-CHEK GUIDE CONTROL) LIQD 1 Bottle by In Vitro route as needed. 04/15/19   Dennard Nip D, MD  Blood Glucose Monitoring Suppl (ACCU-CHEK GUIDE) w/Device KIT 1 each by Does not apply route 4 (four) times daily. Use to test blood sugars 4x a day 03/08/19   Dennard Nip D, MD  glucose blood (ACCU-CHEK GUIDE) test strip 1 each by Other route 4 (four) times daily. Accu Chek Guide  Test blood sugars 4 times a day 03/08/19   Dennard Nip D, MD  HYDROcodone-acetaminophen (NORCO/VICODIN) 5-325 MG tablet Take 1 tablet by mouth every 6 (six) hours as needed for  severe pain. 05/02/19   Susann Lawhorne C, PA-C  Insulin Pen Needle (SURE COMFORT PEN NEEDLES) 32G X 4 MM MISC USE WITH INJECTIONS 03/08/19   Dennard Nip D, MD  metFORMIN (GLUCOPHAGE) 500 MG tablet Take 1 tablet (500 mg total) by mouth daily. 03/08/19   Starlyn Skeans, MD    Allergies    Nsaids and Tape  Review of Systems   Review of Systems  Constitutional: Negative for diaphoresis.  Respiratory: Negative for shortness of breath.   Cardiovascular: Negative for chest pain.  Gastrointestinal: Negative for abdominal pain, nausea and vomiting.  Musculoskeletal: Positive for arthralgias and joint swelling.  Skin: Positive for wound.  Neurological: Negative for dizziness, syncope, weakness, light-headedness, numbness and headaches.  All other systems reviewed and are negative.   Physical Exam Updated Vital Signs BP (!) 147/67   Pulse 64   Temp 98.1 F (36.7 C) (Oral)   Resp 16   LMP 10/22/2007   SpO2 99%   Physical Exam Vitals and nursing note reviewed.  Constitutional:      General: She is not in acute distress.    Appearance: She is well-developed. She is not diaphoretic.  HENT:     Head: Normocephalic.     Comments: Abrasion to the superior occipital scalp as well as underlying swelling consistent with hematoma.  No noted deformity  or instability.  No other noted injury to the scalp or to the face upon further investigation.    Mouth/Throat:     Mouth: Mucous membranes are moist.     Pharynx: Oropharynx is clear.  Eyes:     Conjunctiva/sclera: Conjunctivae normal.  Cardiovascular:     Rate and Rhythm: Normal rate and regular rhythm.     Pulses: Normal pulses.          Radial pulses are 2+ on the right side and 2+ on the left side.       Posterior tibial pulses are 2+ on the right side and 2+ on the left side.     Heart sounds: Normal heart sounds.     Comments: Tactile temperature in the extremities appropriate and equal bilaterally. Pulmonary:     Effort: Pulmonary  effort is normal. No respiratory distress.     Breath sounds: Normal breath sounds.  Abdominal:     Palpations: Abdomen is soft.     Tenderness: There is no abdominal tenderness. There is no guarding.  Musculoskeletal:     Cervical back: Normal range of motion and neck supple.     Right lower leg: No edema.     Left lower leg: No edema.     Comments: Abrasion to the posterior right elbow with some soft tissue swelling.  No tenderness, deformity, or instability.  Full range of motion without pain or noted difficulty in the right elbow. Pain in the right wrist and palmar hand.  Tenderness and bruising over the right thenar eminence.  Pain with movement of the right thumb as well as with the right wrist.  No anatomical snuffbox tenderness. No tenderness or pain in the rest of the right upper extremity.  Abrasions to the left posterior elbow.  No surrounding tenderness, swelling, deformity, or instability.  Full range of motion without pain or noted difficulty. Swelling and deformity to the left wrist.  No tenderness, swelling, deformity, or pain in the left hand. No other tenderness or pain in the rest of the left upper extremity.  No midline spinal tenderness.   Overall trauma exam performed without any abnormalities noted other than those mentioned.  Lymphadenopathy:     Cervical: No cervical adenopathy.  Skin:    General: Skin is warm and dry.     Capillary Refill: Capillary refill takes less than 2 seconds.  Neurological:     Mental Status: She is alert and oriented to person, place, and time.     Comments: No noted acute cognitive deficit. Sensation grossly intact to light touch in the extremities.   Strength 5/5 in all extremities.  No gait disturbance.  Coordination intact.  Cranial nerves III-XII grossly intact.  Handles oral secretions without noted difficulty.  No noted phonation or speech deficit. No facial droop.   She does have some difficulty maintaining strength when  touching the tip of the right index finger and thumb together.  Any movement of the thumb causes pain and pain may be responsible for this perceived deficit.  Psychiatric:        Mood and Affect: Mood and affect normal.        Speech: Speech normal.        Behavior: Behavior normal.     ED Results / Procedures / Treatments   Labs (all labs ordered are listed, but only abnormal results are displayed) Labs Reviewed  CBG MONITORING, ED    EKG None  Radiology DG Elbow Complete Left  Result Date: 05/02/2019 CLINICAL DATA:  Left elbow pain after fall EXAM: LEFT ELBOW - COMPLETE 3+ VIEW COMPARISON:  None. FINDINGS: There is no evidence of fracture, dislocation, or joint effusion. Mild ulnotrochlear degenerative change. Tiny enthesophyte at the triceps tendon insertion. Vascular calcifications. No focal soft tissue swelling. IMPRESSION: Negative. Electronically Signed   By: Davina Poke D.O.   On: 05/02/2019 12:34   DG Elbow Complete Right  Result Date: 05/02/2019 CLINICAL DATA:  Right elbow pain after fall EXAM: RIGHT ELBOW - COMPLETE 3+ VIEW COMPARISON:  None. FINDINGS: There is no evidence of fracture, dislocation, or joint effusion. There is no evidence of arthropathy or other focal bone abnormality. Tiny enthesophyte at the triceps tendon insertion. Prominent vascular calcifications. Mild soft tissue swelling overlies the olecranon process. IMPRESSION: 1. No acute osseous abnormality in the right elbow. 2. Mild soft tissue swelling overlies the olecranon process. Electronically Signed   By: Davina Poke D.O.   On: 05/02/2019 12:33   DG Wrist Complete Left  Result Date: 05/02/2019 CLINICAL DATA:  Post reduction and casting of wrist fracture EXAM: LEFT WRIST - COMPLETE 3+ VIEW COMPARISON:  05/02/2019 FINDINGS: Casting material overlies the left wrist and forearm limiting assessment of bony structures. Still with dorsal angulation and displacement of this intra-articular fracture which  shows foreshortening also of the distal radius. Perhaps slightly diminished dorsal displacement and angulation. Fracture is intra-articular. Ulnar styloid fracture as well similar to prior. IMPRESSION: Persistent dorsal angulation and displacement of intra-articular distal radial fracture with foreshortening of the distal radius. Ulnar styloid fracture as before. Electronically Signed   By: Zetta Bills M.D.   On: 05/02/2019 15:03   DG Wrist Complete Left  Result Date: 05/02/2019 CLINICAL DATA:  Fall, left wrist pain EXAM: LEFT WRIST - COMPLETE 3+ VIEW COMPARISON:  None. FINDINGS: Acute, comminuted, impacted fracture of the distal radius with 6 mm of dorsal displacement and mild dorsal angulation. Fracture extends intra-articularly to the radiocarpal and distal radial-ulnar joints. Mildly displaced fracture of the ulnar styloid. No dislocation. Subchondral cystic changes are present in both the scaphoid and lunate. Diffuse soft tissue swelling. Severe vascular calcifications. IMPRESSION: 1. Acute, comminuted, impacted fracture of the distal radius with 6 mm of dorsal displacement and mild dorsal angulation. 2. Mildly displaced fracture of the ulnar styloid. Electronically Signed   By: Davina Poke D.O.   On: 05/02/2019 12:32   DG Wrist Complete Right  Result Date: 05/02/2019 CLINICAL DATA:  Right hand and wrist pain after fall EXAM: RIGHT WRIST - COMPLETE 3+ VIEW; RIGHT HAND - COMPLETE 3+ VIEW COMPARISON:  None. FINDINGS: Minimally displaced fracture of the dorsal cortex of the distal radius with intra-articular extension to the radiocarpal joint. No angulation. Ulnar styloid intact. Mild degenerative changes of the hand and wrist. Scapholunate interval is upper limits of normal. Mild soft tissue swelling at the dorsal aspect of the wrist. Prominent vascular calcifications. IMPRESSION: 1. Minimally displaced fracture of the dorsal cortex of the distal radius with intra-articular extension to the  radiocarpal joint. 2. Scapholunate interval is upper limits of normal. Electronically Signed   By: Davina Poke D.O.   On: 05/02/2019 12:39   CT Head Wo Contrast  Result Date: 05/02/2019 CLINICAL DATA:  Fall, head trauma EXAM: CT HEAD WITHOUT CONTRAST TECHNIQUE: Contiguous axial images were obtained from the base of the skull through the vertex without intravenous contrast. COMPARISON:  MRI 04/27/2019 FINDINGS: Brain: No evidence of acute infarction, hemorrhage, hydrocephalus, extra-axial collection or mass lesion/mass effect. Scattered  low-density changes within the periventricular and subcortical white matter compatible with chronic microvascular ischemic change. Mild diffuse cerebral volume loss. Vascular: Atherosclerotic calcifications involving the large vessels of the skull base. No unexpected hyperdense vessel. Skull: Normal. Negative for fracture or focal lesion. Sinuses/Orbits: No acute finding. Other: Superficial scalp soft tissue swelling posteriorly near the skull vertex. IMPRESSION: 1. No acute intracranial findings. 2. Superficial scalp soft tissue swelling posteriorly near the skull vertex. No underlying calvarial fracture. 3. Chronic microvascular ischemic change and cerebral volume loss. Electronically Signed   By: Davina Poke D.O.   On: 05/02/2019 12:55   CT Cervical Spine Wo Contrast  Result Date: 05/02/2019 CLINICAL DATA:  Fall, wrist deformity. EXAM: CT CERVICAL SPINE WITHOUT CONTRAST TECHNIQUE: Multidetector CT imaging of the cervical spine was performed without intravenous contrast. Multiplanar CT image reconstructions were also generated. COMPARISON:  CT head same date. FINDINGS: Alignment: Normal. Skull base and vertebrae: No acute fracture. No primary bone lesion or focal pathologic process. Soft tissues and spinal canal: No prevertebral fluid or swelling. No visible canal hematoma. Disc levels: Degenerative changes in the spine most notably at the C5-6 level with  uncovertebral spurring and mild neural foraminal narrowing on the left. Upper chest: Negative. Other: None IMPRESSION: 1. No acute fracture or dislocation of the cervical spine. 2. Degenerative changes in the spine most notably at the C5-6 level with uncovertebral spurring and mild neural foraminal narrowing on the left. Electronically Signed   By: Zetta Bills M.D.   On: 05/02/2019 13:01   DG Hand Complete Right  Result Date: 05/02/2019 CLINICAL DATA:  Right hand and wrist pain after fall EXAM: RIGHT WRIST - COMPLETE 3+ VIEW; RIGHT HAND - COMPLETE 3+ VIEW COMPARISON:  None. FINDINGS: Minimally displaced fracture of the dorsal cortex of the distal radius with intra-articular extension to the radiocarpal joint. No angulation. Ulnar styloid intact. Mild degenerative changes of the hand and wrist. Scapholunate interval is upper limits of normal. Mild soft tissue swelling at the dorsal aspect of the wrist. Prominent vascular calcifications. IMPRESSION: 1. Minimally displaced fracture of the dorsal cortex of the distal radius with intra-articular extension to the radiocarpal joint. 2. Scapholunate interval is upper limits of normal. Electronically Signed   By: Davina Poke D.O.   On: 05/02/2019 12:39    Procedures Reduction of fracture  Date/Time: 05/02/2019 2:13 PM Performed by: Lorayne Bender, PA-C Authorized by: Lorayne Bender, PA-C  Consent: Verbal consent obtained. Risks and benefits: risks, benefits and alternatives were discussed Consent given by: patient Patient understanding: patient states understanding of the procedure being performed Patient consent: the patient's understanding of the procedure matches consent given Procedure consent: procedure consent matches procedure scheduled Required items: required blood products, implants, devices, and special equipment available Patient identity confirmed: verbally with patient and provided demographic data Local anesthesia used:  no  Anesthesia: Local anesthesia used: no  Sedation: Patient sedated: no  Patient tolerance: patient tolerated the procedure well with no immediate complications Comments: Procedure was performed by the Ortho Tech with my evaluation before and after. I was available for consultation throughout the procedure. Circulation, motor, sensory checked and found to be intact before and after reduction and subsequent splinting.    (including critical care time)  Medications Ordered in ED Medications  bupivacaine (MARCAINE) 0.5 % injection 20 mL (has no administration in time range)  bacitracin 500 UNIT/GM ointment (1 application  Given 8/78/67 1350)  bacitracin 500 UNIT/GM ointment (1 application  Given 6/72/09 1351)  ED Course  I have reviewed the triage vital signs and the nursing notes.  Pertinent labs & imaging results that were available during my care of the patient were reviewed by me and considered in my medical decision making (see chart for details).  Clinical Course as of May 01 1541  Sun May 02, 2019  1320 Spoke with Dr. Percell Miller, on call for Hand surgery. Agrees with sugar tong splint on the left.  Reduce with finger traps before splinting.  Thumb spica splint on the right wrist.  Velcro splint is acceptable, if available. She should come to the office tomorrow morning at 8:30am.   [SJ]  1422 This x-ray was reviewed by myself and EDP, Dr. Vanita Panda. In our opinion, it does appear to be improved from previous xray.  DG Wrist Complete Left [SJ]    Clinical Course User Index [SJ] Sage Hammill, Helane Gunther, PA-C   MDM Rules/Calculators/A&P                      Patient presents evaluation following a fall. She has bilateral radial fractures.  There was suspicion for possible right scapholunate injury as well as the distal radial fracture, therefore, this was taken into consideration with splinting.  She does not have evidence of neurovascular compromise. Home health face-to-face was  ordered for PT/OT due to injuries that would require more assistance.  She also feels as though she has some deconditioning that has caused her to be intermittently unsteady on her feet.  Her x-rays were personally reviewed and interpreted by me.  Findings and plan of care discussed with Adrian Prows, MD. Dr. Vanita Panda personally evaluated and examined this patient.   Vitals:   05/02/19 1122 05/02/19 1352  BP: (!) 147/67 (!) 156/93  Pulse: 64 77  Resp: 16 16  Temp: 98.1 F (36.7 C)   TempSrc: Oral   SpO2: 99% 100%     Final Clinical Impression(s) / ED Diagnoses Final diagnoses:  Fall, initial encounter  Injury of head, initial encounter  Other closed intra-articular fracture of distal end of left radius, initial encounter  Other closed intra-articular fracture of distal end of right radius, initial encounter    Rx / DC Orders ED Discharge Orders         Catharine     05/02/19 1428    Face-to-face encounter (required for Medicare/Medicaid patients)    Comments: I Helane Gunther Lean Jaeger certify that this patient is under my care and that I, or a nurse practitioner or physician's assistant working with me, had a face-to-face encounter that meets the physician face-to-face encounter requirements with this patient on 05/02/2019. The encounter with the patient was in whole, or in part for the following medical condition(s) which is the primary reason for home health care (List medical condition): Instability with walking. Acute injury involving both wrists. Needs assistance and therapy to perform ADLs.   05/02/19 1428    HYDROcodone-acetaminophen (NORCO/VICODIN) 5-325 MG tablet  Every 6 hours PRN     05/02/19 1514           Layla Maw 05/02/19 1543    Carmin Muskrat, MD 05/02/19 2156

## 2019-05-02 NOTE — ED Notes (Signed)
RN removed patient's ring and gave to her husband with her permission due to hand swelling

## 2019-05-02 NOTE — ED Triage Notes (Signed)
Per [facility] pt fell going up steps on her friend's portch. Has abrasion to bilat elbows and hands, right elbow deformity, left wrist deformity. Did hit her head, denies LOC or blood thinners.  Vitals: 66HR, 152/72, 98% on RA, CBG 160.

## 2019-05-03 ENCOUNTER — Encounter: Payer: Self-pay | Admitting: Neurology

## 2019-05-03 ENCOUNTER — Ambulatory Visit: Payer: Medicare PPO | Admitting: Neurology

## 2019-05-03 ENCOUNTER — Other Ambulatory Visit: Payer: Self-pay | Admitting: Oncology

## 2019-05-03 VITALS — BP 107/62 | HR 72 | Resp 18 | Ht 69.0 in | Wt 178.0 lb

## 2019-05-03 DIAGNOSIS — G5730 Lesion of lateral popliteal nerve, unspecified lower limb: Secondary | ICD-10-CM

## 2019-05-03 DIAGNOSIS — T451X5A Adverse effect of antineoplastic and immunosuppressive drugs, initial encounter: Secondary | ICD-10-CM

## 2019-05-03 DIAGNOSIS — G620 Drug-induced polyneuropathy: Secondary | ICD-10-CM | POA: Diagnosis not present

## 2019-05-03 DIAGNOSIS — E1142 Type 2 diabetes mellitus with diabetic polyneuropathy: Secondary | ICD-10-CM

## 2019-05-03 DIAGNOSIS — S52501A Unspecified fracture of the lower end of right radius, initial encounter for closed fracture: Secondary | ICD-10-CM | POA: Diagnosis not present

## 2019-05-03 NOTE — Patient Instructions (Addendum)
Take extra care when walking  Continue home exercises  Return to clinic in 9 months

## 2019-05-03 NOTE — Progress Notes (Signed)
Darlene [name] is having ORIF of both wrists on 05/11/2019 under Dr. Percell Miller.  We are moving her anti-HER-2 treatment dose a week back.  I WAS GOING TO REDUCE THE DOSE OF TRASTUZUMAB AND HOLD THE PERJETA BECAUSE I THOUGHT WE WERE TREATING HER q3W BUT SHE IS RECEIVING THESE AGENTS q4W, SO MOVING IT BACK ONE WEEK REQUIRES NO CHANGE IN DOSING.

## 2019-05-03 NOTE — Progress Notes (Signed)
Surgery on 05/11/19.  Need orders in epic.  Thank You.

## 2019-05-03 NOTE — Progress Notes (Signed)
Follow-up Visit   Date: 05/03/19   Darlene [name] MRN: [id number] DOB: 30-Jan-1950   Interim History: Darlene [name] is a 69 y.o. right-handed Caucasian female with insulin-dependent diabetes mellitus, gastric bypass (2015 weight 130lb weight loss), right breast cancer s/p mastectomy, chemotherapy, and radiation (2016) with liver metastasis in 2017, and hypertension  returning to the clinic for follow-up of neuropathy and bilateral foot drop.  The patient was accompanied to the clinic by self.  History of present illness: She has a long history of diabetic neuropathy since around 2010 manifesting with numbness in the feet and lower legs.  She denies painful paresthesias.  She does have imbalance and walks unassisted, although a cane has been recommended.   She is avid Hospital doctor and takes a hiking stick when she is walking.  No falls.  She lives in a one-level home.  Initially, her diabetes was poorly-controlled with HbA1c ranging in 10-12, however, after her gastric bypass in 2015, she has markedly improved diabetes management.  Her last HbA1c is 5.5 on insulin and metformin.  She lost 130lb with her surgery.  She takes daily multievitamin and B12 injection every 2 weeks. B12 level from August was normal.  Her neuropathy did get worse in 2017 when she received chemotherapy for breast cancer (carboplatin, docetaxel, trastuzumab and pertuzumab given every 3 weeks 6. Trastuzumab + capecitabine). No history of heavy alcohol use.    She developed left foot drop in 2019 which improved with physical therapy.  Further testing was not done.  She underwent right bunionectomy on 12/09/2018.  She was immobilized in a boot for several weeks and did not notice any weakness.  Starting in January 2021, she was cleared to start to wear sneakers and she felt that both feet were weak.  She went to physical therapy and noticed that she has bilateral foot drop, with the left foot asymmetrically weaker.   She denies low back pain or radicular pain.  UPDATE 05/03/2019:  Since her last visit, she had NCS/EMG of the legs which showed severe sensorimotor polyneuropathy with superimposed bilateral peroneal mononeuropathy at the fibular head. She was doing PT and got a brace, which has helped stabilize her foot.  She has noticed mild improvement, such that she is able to extend the left foot, which was previously completely weak.    She suffered a fall yesterday and fracture her distal left ulnar/humerus as well as right wrist.  She will undergo surgery next week.  She was not using a cane when walking.  She also had MRI brain as part of cancer surveillance last week and has asked that I review this.   Medications:  Current Outpatient Medications on File Prior to Visit  Medication Sig Dispense Refill  . Accu-Chek Softclix Lancets lancets 1 each by Other route 4 (four) times daily. Test blood sugars 4 x times daily 100 each 1  . acetaminophen (TYLENOL) 325 MG tablet Take 650 mg by mouth 2 (two) times daily as needed for moderate pain or headache.     [name] aspirin EC 81 MG tablet Take 1 tablet (81 mg total) by mouth daily. 90 tablet 3  . Blood Glucose Calibration (ACCU-CHEK GUIDE CONTROL) LIQD 1 Bottle by In Vitro route as needed. 1 each 0  . Blood Glucose Monitoring Suppl (ACCU-CHEK GUIDE) w/Device KIT 1 each by Does not apply route 4 (four) times daily. Use to test blood sugars 4x a day 1 kit 0  . buPROPion [facility] SR)  200 MG 12 hr tablet TAKE 1 TABLET AT 12 NOON. (Patient taking differently: Take 200 mg by mouth at bedtime. ) 90 tablet 0  . CALCIUM CITRATE-VITAMIN D PO Take 1 tablet by mouth 3 (three) times daily.     . carvedilol (COREG) 3.125 MG tablet TAKE 1 TABLET BY MOUTH TWICE DAILY. 180 tablet 3  . cholecalciferol (VITAMIN D) 1000 units tablet Take 1,000 Units by mouth daily.    . cholestyramine (QUESTRAN) 4 g packet Take 1 packet (4 g total) by mouth 2 (two) times daily as needed. 10 each 12   . Cyanocobalamin (VITAMIN B-12 PO) Take 1 tablet by mouth every 14 (fourteen) days.    . diphenhydrAMINE (BENADRYL) 25 MG tablet Take 25 mg by mouth daily as needed for allergies or sleep.     [name] glucose blood (ACCU-CHEK GUIDE) test strip 1 each by Other route 4 (four) times daily. Accu Chek Guide  Test blood sugars 4 times a day 100 each 1  . HYDROcodone-acetaminophen (NORCO/VICODIN) 5-325 MG tablet Take 1 tablet by mouth every 6 (six) hours as needed for severe pain. 10 tablet 0  . insulin glargine (LANTUS SOLOSTAR) 100 UNIT/ML Solostar Pen Inject 12 Units into the skin daily. 15 pen 0  . Insulin Pen Needle (SURE COMFORT PEN NEEDLES) 32G X 4 MM MISC USE WITH INJECTIONS 100 each 0  . liraglutide (VICTOZA) 18 MG/3ML SOPN INJECT 1.8MG(0.3 MLS) INTO THE SKIN EVERY MORNING 9 pen 0  . loratadine (CLARITIN) 10 MG tablet Take 10 mg by mouth as needed for allergies.    [name] losartan (COZAAR) 100 MG tablet Take 1 tablet (100 mg total) by mouth daily. 90 tablet 0  . Pediatric Multivitamins-Iron (FLINTSTONES PLUS IRON) chewable tablet Chew 1 tablet by mouth 2 (two) times daily.    . Probiotic Product ([employer] PROBIOTIC PO) Take 1 capsule by mouth daily.     . prochlorperazine (COMPAZINE) 10 MG tablet Take 1 tablet (10 mg total) by mouth every 6 (six) hours as needed for nausea or vomiting. 30 tablet 0  . Propylene Glycol (SYSTANE BALANCE OP) Place 1-2 drops into both eyes 3 (three) times daily as needed (for dry eyes).     . rosuvastatin (CRESTOR) 10 MG tablet Take 1 tablet (10 mg total) by mouth daily. 90 tablet 3  . senna-docusate (SENNA PLUS) 8.6-50 MG tablet Take 1 tablet by mouth daily as needed for moderate constipation.     . topiramate (TOPAMAX) 25 MG tablet Take 1 tablet (25 mg total) by mouth daily. 90 tablet 0  . vortioxetine HBr (TRINTELLIX) 20 MG TABS tablet Take 1 tablet (20 mg total) by mouth at bedtime. 90 tablet 0  . metFORMIN (GLUCOPHAGE) 500 MG tablet Take 1 tablet (500 mg total) by mouth daily.  90 tablet 0   No current facility-administered medications on file prior to visit.    Allergies:  Allergies  Allergen Reactions  . Nsaids Other (See Comments)    H/o gastric bypass - avoid NSAIDs  . Tape Hives, Rash and Other (See Comments)    Adhesives in bandaids    Vital Signs:  BP 107/62   Pulse 72   Resp 18   Ht _0  (1.753 m)   Wt 178 lb (80.7 kg)   LMP 10/22/2007   SpO2 95%   BMI 26.29 kg/m     Neurological Exam: MENTAL STATUS including orientation to time, place, person, recent and remote memory, attention span and concentration, language, and fund of  knowledge is normal.  Speech is not dysarthric.  CRANIAL NERVES:  Normal conjugate, extra-ocular eye movements in all directions of gaze.  No ptosis.    MOTOR:  Antigravity in the upper extremities.   Left arm in wrist splint, left arm immobilized in cast  Lower Extremity:  Right  Left  Hip flexors  5/5   5/5   Hip extensors  5/5   5/5   Adductor 5/5  5/5  Abductor 5/5  5/5  Knee flexors  5/5   5/5   Knee extensors  5/5   5/5   Dorsiflexors  4/5   3-/5   Plantarflexors  5/5   5/5   Tone (Ashworth scale)  0  0   COORDINATION/GAIT:  Gait assisted with left ankle brace and supported by one person, due to imbalance.   Data: NCS/EMG of the legs 03/03/19: 1. The electrophysiologic findings are consistent with a chronic, severe, sensorimotor axonal polyneuropathy affecting the lower extremities. 2. There is evidence of a superimposed common peroneal neuropathy at the fibular neck affecting bilateral lower extremities, findings are severe in degree electrically and worse on the left.  MRI brain wwo contrast 04/27/2019: No evidence of regional metastatic disease. Moderate chronic small-vessel changes of the pons and cerebral hemispheric white matter.  IMPRESSION/PLAN: 1.  Neuropathy due to diabetes and chemotherapy with severe sensorimotor deficits and ataxia, longstanding.  Diabetes is now very well-controlled.  -  Strongly encouraged her to use a cane/walker   2.  Bilateral peroneal mononeuropathy at the fibular head, improving  - Avoid crossing the legs  - Continue PT when able  - Continue to use AFO  3.  White matter changes on MRI brain was personally viewed with patient and is normal-finding.  Patient reassured that I do not see anything worrisome to explain her imbalance.  Imbalance is stemming from neuropathy.  Return to clinic in 9 months.    Thank you for allowing me to participate in patient's care.  If I can answer any additional questions, I would be pleased to do so.    Sincerely,    Kaizley Aja K. Posey Pronto, DO

## 2019-05-04 ENCOUNTER — Encounter: Payer: Self-pay | Admitting: Oncology

## 2019-05-04 ENCOUNTER — Encounter: Payer: Self-pay | Admitting: Podiatry

## 2019-05-04 ENCOUNTER — Encounter (HOSPITAL_COMMUNITY): Payer: Self-pay | Admitting: Orthopedic Surgery

## 2019-05-04 ENCOUNTER — Other Ambulatory Visit: Payer: Self-pay

## 2019-05-06 NOTE — Progress Notes (Signed)
Spoke with patient and instructed patient she could have clear liquids from 24midnite nite before surgery until 0700am morning of surgery.  Reviewed clear liquid diet with patient . Patient voiced understanding.

## 2019-05-07 ENCOUNTER — Other Ambulatory Visit (HOSPITAL_COMMUNITY)
Admission: RE | Admit: 2019-05-07 | Discharge: 2019-05-07 | Disposition: A | Discharge status: Home / Self Care | Payer: Medicare PPO | Source: Ambulatory Visit | Attending: Orthopedic Surgery | Admitting: Orthopedic Surgery

## 2019-05-07 DIAGNOSIS — Z20822 Contact with and (suspected) exposure to covid-19: Secondary | ICD-10-CM | POA: Diagnosis not present

## 2019-05-07 DIAGNOSIS — Z01812 Encounter for preprocedural laboratory examination: Secondary | ICD-10-CM | POA: Diagnosis not present

## 2019-05-07 LAB — SARS CORONAVIRUS 2 (TAT 6-24 HRS): SARS Coronavirus 2: NEGATIVE

## 2019-05-07 NOTE — H&P (Signed)
[facility] [address] [place], [place] [address] [phone] A Division of [facility]  RE: [place], [place]   [birth year] INITIAL EVALUATION 05-03-19 Reason for visit: Bilateral wrist injuries.   History of present illness: She is 69 years old.  She suffered a fall backwards off of a chair and suffered a fracture of the right wrist, minimally displaced distal radius fracture.  At the left wrist she had a significantly displaced distal radius fracture that required reduction in the emergency room.     EXAMINATION: Well appearing female in no apparent distress.  Neurovascularly intact to bilateral upper extremities.  Splint is benign.    X-RAYS: X-rays reviewed.  The left wrist has operative distal radius fracture.  The right is amenable to non-operative management.    ASSESSMENT/PLAN: Significant left displaced distal radius fracture.  I would recommend ORIF of this.  Right minimally displaced distal radius fracture.  Recommend conservative management.  We will continue splinting.  She can use that for up to five pounds.    Ernesta Amble.  Percell Miller, M.D.  Electronically verified by Ernesta Amble. Percell Miller, M.D. TDM:jjh Cc:  Yaakov Guthrie MD  fax [phone]  D 05-04-19 T 05-06-19

## 2019-05-10 ENCOUNTER — Ambulatory Visit: Payer: Medicare PPO | Admitting: Podiatry

## 2019-05-10 NOTE — Anesthesia Preprocedure Evaluation (Addendum)
Anesthesia Evaluation  Patient identified by MRN, date of birth, ID band Patient awake    Reviewed: Allergy & Precautions, NPO status , Patient's Chart, lab work & pertinent test results  Airway Mallampati: II  TM Distance: >3 FB Neck ROM: Full    Dental no notable dental hx. (+) Teeth Intact, Caps   Pulmonary sleep apnea and Continuous Positive Airway Pressure Ventilation , pneumonia, resolved,    Pulmonary exam normal breath sounds clear to auscultation       Cardiovascular hypertension, Pt. on medications and Pt. on home beta blockers Normal cardiovascular exam+ Valvular Problems/Murmurs  Rhythm:Regular Rate:Normal     Neuro/Psych PSYCHIATRIC DISORDERS Anxiety Depression Peripheral Neuropathy- Chemo induced as well as Diabetic  Neuromuscular disease    GI/Hepatic negative GI ROS, Metastatic disease to liver  S/P Gastric bypass   Endo/Other  diabetes, Well Controlled, Type 2, Insulin Dependent, Oral Hypoglycemic AgentsHyperlipidemia Right breast Ca S/P MRM, ChemoRx and RT  Renal/GU Renal InsufficiencyRenal disease  negative genitourinary   Musculoskeletal  (+) Arthritis , Osteoarthritis,  Fx Left Wrist   Abdominal   Peds  Hematology  (+) anemia ,   Anesthesia Other Findings   Reproductive/Obstetrics                           Anesthesia Physical Anesthesia Plan  ASA: III  Anesthesia Plan: MAC and Regional   Post-op Pain Management:    Induction: Intravenous  PONV Risk Score and Plan: 3 and Ondansetron, Scopolamine patch - Pre-op and Treatment may vary due to age or medical condition  Airway Management Planned: Nasal Cannula, Natural Airway and Simple Face Mask  Additional Equipment:   Intra-op Plan:   Post-operative Plan:   Informed Consent: I have reviewed the patients History and Physical, chart, labs and discussed the procedure including the risks, benefits and  alternatives for the proposed anesthesia with the patient or authorized representative who has indicated his/her understanding and acceptance.     Dental advisory given  Plan Discussed with: CRNA and Surgeon  Anesthesia Plan Comments:       Anesthesia Quick Evaluation

## 2019-05-11 ENCOUNTER — Ambulatory Visit (HOSPITAL_COMMUNITY)
Admission: RE | Admit: 2019-05-11 | Discharge: 2019-05-11 | Disposition: A | Discharge status: Home / Self Care | Payer: Medicare PPO | Attending: Orthopedic Surgery | Admitting: Orthopedic Surgery

## 2019-05-11 ENCOUNTER — Ambulatory Visit (HOSPITAL_COMMUNITY): Payer: Medicare PPO | Admitting: Anesthesiology

## 2019-05-11 ENCOUNTER — Encounter (HOSPITAL_COMMUNITY): Payer: Self-pay | Admitting: Orthopedic Surgery

## 2019-05-11 ENCOUNTER — Other Ambulatory Visit: Payer: Self-pay

## 2019-05-11 ENCOUNTER — Other Ambulatory Visit: Payer: Medicare Other

## 2019-05-11 ENCOUNTER — Encounter (HOSPITAL_COMMUNITY)
Admission: RE | Disposition: A | Discharge status: Home / Self Care | Payer: Self-pay | Source: Home / Self Care | Attending: Orthopedic Surgery

## 2019-05-11 ENCOUNTER — Ambulatory Visit: Payer: Medicare Other

## 2019-05-11 DIAGNOSIS — S52501A Unspecified fracture of the lower end of right radius, initial encounter for closed fracture: Secondary | ICD-10-CM | POA: Insufficient documentation

## 2019-05-11 DIAGNOSIS — Z923 Personal history of irradiation: Secondary | ICD-10-CM | POA: Insufficient documentation

## 2019-05-11 DIAGNOSIS — Z853 Personal history of malignant neoplasm of breast: Secondary | ICD-10-CM | POA: Diagnosis not present

## 2019-05-11 DIAGNOSIS — S52502A Unspecified fracture of the lower end of left radius, initial encounter for closed fracture: Secondary | ICD-10-CM

## 2019-05-11 DIAGNOSIS — G4730 Sleep apnea, unspecified: Secondary | ICD-10-CM | POA: Insufficient documentation

## 2019-05-11 DIAGNOSIS — M1990 Unspecified osteoarthritis, unspecified site: Secondary | ICD-10-CM | POA: Insufficient documentation

## 2019-05-11 DIAGNOSIS — E1140 Type 2 diabetes mellitus with diabetic neuropathy, unspecified: Secondary | ICD-10-CM | POA: Diagnosis not present

## 2019-05-11 DIAGNOSIS — I10 Essential (primary) hypertension: Secondary | ICD-10-CM | POA: Diagnosis not present

## 2019-05-11 DIAGNOSIS — E1151 Type 2 diabetes mellitus with diabetic peripheral angiopathy without gangrene: Secondary | ICD-10-CM | POA: Insufficient documentation

## 2019-05-11 DIAGNOSIS — Z794 Long term (current) use of insulin: Secondary | ICD-10-CM | POA: Diagnosis not present

## 2019-05-11 DIAGNOSIS — W07XXXA Fall from chair, initial encounter: Secondary | ICD-10-CM | POA: Insufficient documentation

## 2019-05-11 DIAGNOSIS — Z9884 Bariatric surgery status: Secondary | ICD-10-CM | POA: Diagnosis not present

## 2019-05-11 DIAGNOSIS — C787 Secondary malignant neoplasm of liver and intrahepatic bile duct: Secondary | ICD-10-CM | POA: Diagnosis not present

## 2019-05-11 DIAGNOSIS — E785 Hyperlipidemia, unspecified: Secondary | ICD-10-CM | POA: Diagnosis not present

## 2019-05-11 DIAGNOSIS — Z9221 Personal history of antineoplastic chemotherapy: Secondary | ICD-10-CM | POA: Diagnosis not present

## 2019-05-11 DIAGNOSIS — S52572A Other intraarticular fracture of lower end of left radius, initial encounter for closed fracture: Secondary | ICD-10-CM | POA: Diagnosis not present

## 2019-05-11 DIAGNOSIS — Z9011 Acquired absence of right breast and nipple: Secondary | ICD-10-CM | POA: Insufficient documentation

## 2019-05-11 HISTORY — PX: ORIF WRIST FRACTURE: SHX2133

## 2019-05-11 HISTORY — DX: Chronic kidney disease, unspecified: N18.9

## 2019-05-11 LAB — BASIC METABOLIC PANEL
Anion gap: 10 (ref 5–15)
BUN: 35 mg/dL — ABNORMAL HIGH (ref 8–23)
CO2: 19 mmol/L — ABNORMAL LOW (ref 22–32)
Calcium: 9 mg/dL (ref 8.9–10.3)
Chloride: 112 mmol/L — ABNORMAL HIGH (ref 98–111)
Creatinine, Ser: 1.65 mg/dL — ABNORMAL HIGH (ref 0.44–1.00)
GFR calc Af Amer: 37 mL/min — ABNORMAL LOW (ref >60)
GFR calc non Af Amer: 32 mL/min — ABNORMAL LOW (ref >60)
Glucose, Bld: 142 mg/dL — ABNORMAL HIGH (ref 70–99)
Potassium: 4 mmol/L (ref 3.5–5.1)
Sodium: 141 mmol/L (ref 135–145)

## 2019-05-11 LAB — CBC
HCT: 34.3 % — ABNORMAL LOW (ref 36.0–46.0)
Hemoglobin: 11.4 g/dL — ABNORMAL LOW (ref 12.0–15.0)
MCH: 31.7 pg (ref 26.0–34.0)
MCHC: 33.2 g/dL (ref 30.0–36.0)
MCV: 95.3 fL (ref 80.0–100.0)
Platelets: 207 10*3/uL (ref 150–400)
RBC: 3.6 MIL/uL — ABNORMAL LOW (ref 3.87–5.11)
RDW: 13.1 % (ref 11.5–15.5)
WBC: 6.6 10*3/uL (ref 4.0–10.5)
nRBC: 0 % (ref 0.0–0.2)

## 2019-05-11 LAB — HEMOGLOBIN A1C
Hgb A1c MFr Bld: 5.6 % (ref 4.8–5.6)
Mean Plasma Glucose: 114.02 mg/dL

## 2019-05-11 LAB — GLUCOSE, CAPILLARY
Glucose-Capillary: 130 mg/dL — ABNORMAL HIGH (ref 70–99)
Glucose-Capillary: 147 mg/dL — ABNORMAL HIGH (ref 70–99)

## 2019-05-11 SURGERY — OPEN REDUCTION INTERNAL FIXATION (ORIF) WRIST FRACTURE
Anesthesia: Monitor Anesthesia Care | Site: Wrist | Laterality: Left

## 2019-05-11 MED ORDER — OXYCODONE HCL 5 MG PO TABS: 5.0000 mg | ORAL_TABLET | Freq: Once | ORAL | Status: AC | PRN

## 2019-05-11 MED ORDER — LIDOCAINE 2% (20 MG/ML) 5 ML SYRINGE
INTRAMUSCULAR | Status: AC
  Filled 2019-05-11: qty 5

## 2019-05-11 MED ORDER — MIDAZOLAM HCL 2 MG/2ML IJ SOLN
INTRAMUSCULAR | Status: AC
  Filled 2019-05-11: qty 2

## 2019-05-11 MED ORDER — OXYCODONE HCL 5 MG PO TABS: 5.0000 mg | ORAL_TABLET | ORAL | 0 refills | Status: AC | PRN

## 2019-05-11 MED ORDER — DEXAMETHASONE SODIUM PHOSPHATE 10 MG/ML IJ SOLN
INTRAMUSCULAR | Status: DC | PRN
  Administered 2019-05-11: 4 mg via INTRAVENOUS

## 2019-05-11 MED ORDER — MEPERIDINE HCL 50 MG/ML IJ SOLN: 6.2500 mg | INTRAMUSCULAR | Status: DC | PRN

## 2019-05-11 MED ORDER — FENTANYL CITRATE (PF) 100 MCG/2ML IJ SOLN: 25.0000 ug | INTRAMUSCULAR | Status: DC | PRN

## 2019-05-11 MED ORDER — OXYCODONE HCL 5 MG/5ML PO SOLN
5.0000 mg | Freq: Once | ORAL | Status: AC | PRN
  Administered 2019-05-11: 5 mg via ORAL

## 2019-05-11 MED ORDER — FENTANYL CITRATE (PF) 100 MCG/2ML IJ SOLN
INTRAMUSCULAR | Status: DC | PRN
  Administered 2019-05-11 (×2): 25 ug via INTRAVENOUS
  Administered 2019-05-11: 50 ug via INTRAVENOUS

## 2019-05-11 MED ORDER — CEFAZOLIN SODIUM-DEXTROSE 2-4 GM/100ML-% IV SOLN
2.0000 g | INTRAVENOUS | Status: AC
  Administered 2019-05-11: 2 g via INTRAVENOUS

## 2019-05-11 MED ORDER — ACETAMINOPHEN 500 MG PO TABS
ORAL_TABLET | ORAL | Status: AC
  Filled 2019-05-11: qty 2

## 2019-05-11 MED ORDER — PROPOFOL 10 MG/ML IV BOLUS
INTRAVENOUS | Status: AC
  Filled 2019-05-11: qty 20

## 2019-05-11 MED ORDER — OXYCODONE HCL 5 MG/5ML PO SOLN
ORAL | Status: AC
  Filled 2019-05-11: qty 5

## 2019-05-11 MED ORDER — ONDANSETRON HCL 4 MG/2ML IJ SOLN
INTRAMUSCULAR | Status: DC | PRN
  Administered 2019-05-11: 4 mg via INTRAVENOUS

## 2019-05-11 MED ORDER — 0.9 % SODIUM CHLORIDE (POUR BTL) OPTIME
TOPICAL | Status: DC | PRN
  Administered 2019-05-11: 1000 mL

## 2019-05-11 MED ORDER — CEFAZOLIN SODIUM-DEXTROSE 2-4 GM/100ML-% IV SOLN
INTRAVENOUS | Status: AC
  Filled 2019-05-11: qty 100

## 2019-05-11 MED ORDER — DEXAMETHASONE SODIUM PHOSPHATE 10 MG/ML IJ SOLN
INTRAMUSCULAR | Status: AC
  Filled 2019-05-11: qty 1

## 2019-05-11 MED ORDER — MIDAZOLAM HCL 2 MG/2ML IJ SOLN
INTRAMUSCULAR | Status: DC | PRN
  Administered 2019-05-11: 1 mg via INTRAVENOUS

## 2019-05-11 MED ORDER — LACTATED RINGERS IV SOLN: INTRAVENOUS | Status: DC

## 2019-05-11 MED ORDER — ROPIVACAINE HCL 5 MG/ML IJ SOLN
INTRAMUSCULAR | Status: DC | PRN
  Administered 2019-05-11: 30 mL via PERINEURAL

## 2019-05-11 MED ORDER — PROPOFOL 500 MG/50ML IV EMUL
INTRAVENOUS | Status: DC | PRN
  Administered 2019-05-11: 125 ug/kg/min via INTRAVENOUS

## 2019-05-11 MED ORDER — ONDANSETRON HCL 4 MG PO TABS: 4.0000 mg | ORAL_TABLET | Freq: Three times a day (TID) | ORAL | 0 refills | Status: DC | PRN

## 2019-05-11 MED ORDER — ONDANSETRON HCL 4 MG/2ML IJ SOLN
INTRAMUSCULAR | Status: AC
  Filled 2019-05-11: qty 2

## 2019-05-11 MED ORDER — ACETAMINOPHEN 500 MG PO TABS
1000.0000 mg | ORAL_TABLET | Freq: Once | ORAL | Status: AC
  Administered 2019-05-11: 1000 mg via ORAL

## 2019-05-11 MED ORDER — ONDANSETRON HCL 4 MG/2ML IJ SOLN: 4.0000 mg | Freq: Once | INTRAMUSCULAR | Status: DC | PRN

## 2019-05-11 MED ORDER — FENTANYL CITRATE (PF) 100 MCG/2ML IJ SOLN
INTRAMUSCULAR | Status: AC
  Filled 2019-05-11: qty 2

## 2019-05-11 MED ORDER — ACETAMINOPHEN 500 MG PO TABS: 1000.0000 mg | ORAL_TABLET | Freq: Three times a day (TID) | ORAL | 0 refills | Status: AC

## 2019-05-11 MED ORDER — CHLORHEXIDINE GLUCONATE 4 % EX LIQD: 60.0000 mL | Freq: Once | CUTANEOUS | Status: DC

## 2019-05-11 SURGICAL SUPPLY — 53 items
BAG ZIPLOCK 12X15 (MISCELLANEOUS) ×2 IMPLANT
BIT DRILL 2.2 SS TIBIAL (BIT) ×2 IMPLANT
BNDG ELASTIC 3X5.8 VLCR STR LF (GAUZE/BANDAGES/DRESSINGS) ×4 IMPLANT
BNDG ELASTIC 4X5.8 VLCR STR LF (GAUZE/BANDAGES/DRESSINGS) ×2 IMPLANT
BNDG ESMARK 4X9 LF (GAUZE/BANDAGES/DRESSINGS) ×2 IMPLANT
CLSR STERI-STRIP ANTIMIC 1/2X4 (GAUZE/BANDAGES/DRESSINGS) ×2 IMPLANT
CORD BIPOLAR FORCEPS 12FT (ELECTRODE) ×2 IMPLANT
COVER SURGICAL LIGHT HANDLE (MISCELLANEOUS) ×2 IMPLANT
COVER WAND RF STERILE (DRAPES) ×2 IMPLANT
CUFF TOURN SGL QUICK 18X4 (TOURNIQUET CUFF) ×2 IMPLANT
DECANTER SPIKE VIAL GLASS SM (MISCELLANEOUS) IMPLANT
DRAPE IMP U-DRAPE 54X76 (DRAPES) ×2 IMPLANT
DRAPE OEC MINIVIEW 54X84 (DRAPES) ×2 IMPLANT
DRAPE U-SHAPE 47X51 STRL (DRAPES) ×2 IMPLANT
DRSG EMULSION OIL 3X3 NADH (GAUZE/BANDAGES/DRESSINGS) ×2 IMPLANT
DRSG PAD ABDOMINAL 8X10 ST (GAUZE/BANDAGES/DRESSINGS) ×2 IMPLANT
ELECT REM PT RETURN 15FT ADLT (MISCELLANEOUS) ×2 IMPLANT
GAUZE 4X4 16PLY RFD (DISPOSABLE) IMPLANT
GAUZE SPONGE 4X4 12PLY STRL (GAUZE/BANDAGES/DRESSINGS) ×4 IMPLANT
GAUZE XEROFORM 1X8 LF (GAUZE/BANDAGES/DRESSINGS) ×2 IMPLANT
GLOVE BIO SURGEON STRL SZ7.5 (GLOVE) ×4 IMPLANT
GLOVE BIOGEL PI IND STRL 8 (GLOVE) ×2 IMPLANT
GLOVE BIOGEL PI INDICATOR 8 (GLOVE) ×2
GOWN STRL REUS W/TWL XL LVL3 (GOWN DISPOSABLE) ×4 IMPLANT
K-WIRE 1.6 (WIRE) ×4
K-WIRE FX5X1.6XNS BN SS (WIRE) ×2
KIT BASIN OR (CUSTOM PROCEDURE TRAY) ×2 IMPLANT
KWIRE FX5X1.6XNS BN SS (WIRE) ×2 IMPLANT
MANIFOLD NEPTUNE II (INSTRUMENTS) ×2 IMPLANT
PACK ORTHO EXTREMITY (CUSTOM PROCEDURE TRAY) ×2 IMPLANT
PAD CAST 3X4 CTTN HI CHSV (CAST SUPPLIES) ×1 IMPLANT
PAD CAST 4YDX4 CTTN HI CHSV (CAST SUPPLIES) ×1 IMPLANT
PADDING CAST COTTON 3X4 STRL (CAST SUPPLIES) ×2
PADDING CAST COTTON 4X4 STRL (CAST SUPPLIES) ×2
PADDING UNDERCAST 2 STRL (CAST SUPPLIES) ×1
PADDING UNDERCAST 2X4 STRL (CAST SUPPLIES) ×1 IMPLANT
PEG LOCKING SMOOTH 2.2X18 (Peg) ×10 IMPLANT
PEG LOCKING SMOOTH 2.2X20 (Screw) ×4 IMPLANT
PLATE STANDARD DVR LEFT (Plate) ×2 IMPLANT
PLATE STD DVR LT 24X51 (Plate) ×1 IMPLANT
PROTECTOR NERVE ULNAR (MISCELLANEOUS) IMPLANT
SCREW LOCKING 2.7X13MM (Screw) ×2 IMPLANT
SCREW LOCKING 2.7X15MM (Screw) ×4 IMPLANT
SLING ARM FOAM STRAP LRG (SOFTGOODS) ×2 IMPLANT
SPLINT PLASTER CAST XFAST 5X30 (CAST SUPPLIES) ×1 IMPLANT
SPLINT PLASTER XFAST SET 5X30 (CAST SUPPLIES) ×1
SUT MNCRL AB 4-0 PS2 18 (SUTURE) ×2 IMPLANT
SUT VIC AB 2-0 CT1 27 (SUTURE) ×2
SUT VIC AB 2-0 CT1 TAPERPNT 27 (SUTURE) ×1 IMPLANT
SUT VIC AB 4-0 PS2 27 (SUTURE) ×2 IMPLANT
TOWEL OR 17X26 10 PK STRL BLUE (TOWEL DISPOSABLE) ×2 IMPLANT
UNDERPAD 30X36 HEAVY ABSORB (UNDERPADS AND DIAPERS) ×2 IMPLANT
YANKAUER SUCT BULB TIP 10FT TU (MISCELLANEOUS) ×2 IMPLANT

## 2019-05-11 NOTE — Op Note (Addendum)
05/11/2019  8:38 AM  PATIENT:  Darlene [name]    PRE-OPERATIVE DIAGNOSIS:  LEFT WRIST FRACTURE  POST-OPERATIVE DIAGNOSIS:  Same  PROCEDURE:  OPEN REDUCTION INTERNAL FIXATION (ORIF) left distal radius fracture  SURGEON:  Renette Butters, MD  ASSISTANT: Roxan Hockey, PA-C, he was present and scrubbed throughout the case, critical for completion in a timely fashion, and for retraction, instrumentation, and closure.   ANESTHESIA:   block  PREOPERATIVE INDICATIONS:  Darlene [name] is a  69 y.o. female with a diagnosis of LEFT WRIST FRACTURE who failed conservative measures and elected for surgical management.    The risks benefits and alternatives were discussed with the patient preoperatively including but not limited to the risks of infection, bleeding, nerve injury, cardiopulmonary complications, the need for revision surgery, among others, and the patient was willing to proceed.  OPERATIVE IMPLANTS: DVR plate  OPERATIVE FINDINGS: displaced intraarticular fracture  BLOOD LOSS: min  COMPLICATIONS: none  TOURNIQUET TIME: 45  OPERATIVE PROCEDURE:  Patient was identified in the preoperative holding area and site was marked by me She was transported to the operating theater and placed on the table in supine position taking care to pad all bony prominences. After a preincinduction time out anesthesia was induced. The left upper extremity was prepped and draped in normal sterile fashion and a pre-incision timeout was performed. She received ancef for preoperative antibiotics.   I made a 5 cm incision centered over her FCR tendon and dissected down carefully to the level of the flexor tendon sheath and incise this longitudinally and retracted the FCR radially and incised the dorsal aspect of the sheath.   I bluntly dissected the FPL muscle belly away from the brachioradialis and then sharply incised the pronator tendon from the distal radius and from the wrist capsule. I  Elevated this off the bone the fractures visible.   I released the brachioradialis from its insertion. I then debrided the fracture and performed a manual reduction. There were 3 plus articular fragments.   I selected a plate and I placed it on the bone. I pinned it into place and was happy on multiple radiographic views with it's placement. I then fixed the plate distally with the locking pegs. I confirmed no articular penetration with the pegs and that none were prominent dorsally.   I then reduced the plate to the shaft improving the volar and radial tilt of her distal radius.  I was happy with the final fluoro xrays. I reviewed 3+ views    I thoroughly irrigated the wound and closed the pronator over top of the plate and then closed the skin in layers with absorbable stitch. Sterile dressing was applied using the PACU in stable condition.  POST OPERATIVE PLAN: NWB, Splint full time. Ambulate for DVT px.

## 2019-05-11 NOTE — Anesthesia Procedure Notes (Signed)
Anesthesia Regional Block: Supraclavicular block   Pre-Anesthetic Checklist: ,, timeout performed, Correct Patient, Correct Site, Correct Laterality, Correct Procedure, Correct Position, site marked, Risks and benefits discussed,  Surgical consent,  Pre-op evaluation,  At surgeon's request and post-op pain management  Laterality: Left  Prep: chloraprep       Needles:  Injection technique: Single-shot  Needle Type: Echogenic Stimulator Needle     Needle Length: 9cm  Needle Gauge: 21   Needle insertion depth: 4 cm   Additional Needles:   Procedures:,,,, ultrasound used (permanent image in chart),,,,  Narrative:  Start time: 05/11/2019 6:55 AM End time: 05/11/2019 7:00 AM Injection made incrementally with aspirations every 5 mL.  Performed by: Personally  Anesthesiologist: Josephine Igo, MD  Additional Notes: Timeout performed. Patient sedated. Relevant anatomy ID'd using [name]. Incremental 2-63ml injection of LA with frequent aspiration. Patient tolerated procedure well.        Left Supraclavicular Block

## 2019-05-11 NOTE — Transfer of Care (Signed)
Immediate Anesthesia Transfer of Care Note  Patient: Darlene [name]  Procedure(s) Performed: OPEN REDUCTION INTERNAL FIXATION (ORIF) left distal radius fracture (Left Wrist)  Patient Location: PACU  Anesthesia Type:MAC  Level of Consciousness: awake, alert  and oriented  Airway & Oxygen Therapy: Patient Spontanous Breathing and Patient connected to face mask oxygen  Post-op Assessment: Report given to RN and Post -op Vital signs reviewed and stable  Post vital signs: Reviewed and stable  Last Vitals:  Vitals Value Taken Time  BP    Temp    Pulse 70 05/11/19 0859  Resp 13 05/11/19 0859  SpO2 100 % 05/11/19 0859  Vitals shown include unvalidated device data.  Last Pain:  Vitals:   05/11/19 0622  TempSrc: Oral  PainSc:       Patients Stated Pain Goal: 5 (08/01/17 7588)  Complications: No apparent anesthesia complications

## 2019-05-11 NOTE — Anesthesia Procedure Notes (Signed)
Procedure Name: MAC Date/Time: 05/11/2019 7:27 AM Performed by: Niel Hummer, CRNA Pre-anesthesia Checklist: Patient identified, Suction available, Emergency Drugs available and Patient being monitored Oxygen Delivery Method: Simple face mask

## 2019-05-11 NOTE — Discharge Instructions (Signed)
Keep wrist elevated with ice as much as possible to reduce pain and swelling. If needed, you may increase pain medication for the first few days post op to 2 tablets every 4 hours.  Stop as needed pain medication as soon as you are able.  Diet: As you were doing prior to hospitalization   Shower:  You have a splint on, leave the splint in place and keep the splint dry with a plastic bag.  Dressing:  You have a splint - leave the splint in place and we will change your bandages during your first follow-up appointment.    Activity:  Increase activity slowly as tolerated, but follow the weight bearing instructions below.  The rules on driving is that you can not be taking narcotics while you drive, and you must feel in control of the vehicle.    Weight Bearing:  Non weight bearing affected wrist.  Sling for comfort.   To prevent constipation: you may use a stool softener such as -  Colace (over the counter) 100 mg by mouth twice a day  Drink plenty of fluids (prune juice may be helpful) and high fiber foods Miralax (over the counter) for constipation as needed.    Itching:  If you experience itching with your medications, try taking only a single pain pill, or even half a pain pill at a time.  You can also use benadryl over the counter for itching or also to help with sleep.   Precautions:  If you experience chest pain or shortness of breath - call 911 immediately for transfer to the hospital emergency department!!  If you develop a fever greater that 101 F, purulent drainage from wound, increased redness or drainage from wound, or calf pain -- Call the office at [phone]                                         Follow- Up Appointment:  Please call for an appointment to be seen in [facility] [place] [facility] - [phone]

## 2019-05-11 NOTE — Interval H&P Note (Signed)
I participated in the care of this patient and agree with the above history, physical and evaluation. I performed a review of the history and a physical exam as detailed   Sehaj Mcenroe Daniel Kaidyn Javid MD  

## 2019-05-11 NOTE — Anesthesia Postprocedure Evaluation (Signed)
Anesthesia Post Note  Patient: Darlene [name]  Procedure(s) Performed: OPEN REDUCTION INTERNAL FIXATION (ORIF) left distal radius fracture (Left Wrist)     Patient location during evaluation: PACU Anesthesia Type: Regional and MAC Level of consciousness: awake and alert and oriented Pain management: pain level controlled Vital Signs Assessment: post-procedure vital signs reviewed and stable Respiratory status: spontaneous breathing, nonlabored ventilation and respiratory function stable Cardiovascular status: stable and blood pressure returned to baseline Postop Assessment: no apparent nausea or vomiting Anesthetic complications: no    Last Vitals:  Vitals:   05/11/19 0922 05/11/19 0930  BP: (!) 157/68   Pulse: 73 63  Resp: 20 (!) 23  Temp:    SpO2: 100% 99%    Last Pain:  Vitals:   05/11/19 0930  TempSrc:   PainSc: 4                  Darlene [name] A.

## 2019-05-12 ENCOUNTER — Encounter: Payer: Self-pay | Admitting: *Deleted

## 2019-05-13 NOTE — Progress Notes (Signed)
Pharmacist Chemotherapy Monitoring - Follow Up Assessment    I verify that I have reviewed each item in the below checklist:  . Regimen for the patient is scheduled for the appropriate day and plan matches scheduled date. [name] Appropriate non-routine labs are ordered dependent on drug ordered. . If applicable, additional medications reviewed and ordered per protocol based on lifetime cumulative doses and/or treatment regimen.   Plan for follow-up and/or issues identified: Yes . I-vent associated with next due treatment: Yes . MD and/or nursing notified: No  Darlene [name] 05/13/2019 11:44 AM

## 2019-05-17 ENCOUNTER — Telehealth (HOSPITAL_COMMUNITY): Payer: Self-pay | Admitting: *Deleted

## 2019-05-17 ENCOUNTER — Other Ambulatory Visit: Payer: Self-pay

## 2019-05-17 ENCOUNTER — Ambulatory Visit (HOSPITAL_COMMUNITY)
Admission: RE | Admit: 2019-05-17 | Discharge: 2019-05-17 | Disposition: A | Discharge status: Home / Self Care | Payer: Medicare PPO | Source: Ambulatory Visit | Attending: Cardiology | Admitting: Cardiology

## 2019-05-17 DIAGNOSIS — E11319 Type 2 diabetes mellitus with unspecified diabetic retinopathy without macular edema: Secondary | ICD-10-CM | POA: Diagnosis not present

## 2019-05-17 DIAGNOSIS — I129 Hypertensive chronic kidney disease with stage 1 through stage 4 chronic kidney disease, or unspecified chronic kidney disease: Secondary | ICD-10-CM | POA: Diagnosis not present

## 2019-05-17 DIAGNOSIS — E1122 Type 2 diabetes mellitus with diabetic chronic kidney disease: Secondary | ICD-10-CM | POA: Diagnosis not present

## 2019-05-17 DIAGNOSIS — S52571D Other intraarticular fracture of lower end of right radius, subsequent encounter for closed fracture with routine healing: Secondary | ICD-10-CM | POA: Diagnosis not present

## 2019-05-17 DIAGNOSIS — N189 Chronic kidney disease, unspecified: Secondary | ICD-10-CM | POA: Diagnosis not present

## 2019-05-17 DIAGNOSIS — S52572D Other intraarticular fracture of lower end of left radius, subsequent encounter for closed fracture with routine healing: Secondary | ICD-10-CM | POA: Diagnosis not present

## 2019-05-17 DIAGNOSIS — D631 Anemia in chronic kidney disease: Secondary | ICD-10-CM | POA: Diagnosis not present

## 2019-05-17 DIAGNOSIS — I10 Essential (primary) hypertension: Secondary | ICD-10-CM

## 2019-05-17 DIAGNOSIS — E1142 Type 2 diabetes mellitus with diabetic polyneuropathy: Secondary | ICD-10-CM | POA: Diagnosis not present

## 2019-05-17 DIAGNOSIS — M21372 Foot drop, left foot: Secondary | ICD-10-CM | POA: Diagnosis not present

## 2019-05-17 LAB — HEPATIC FUNCTION PANEL
ALT: 117 U/L — ABNORMAL HIGH (ref 0–44)
AST: 70 U/L — ABNORMAL HIGH (ref 15–41)
Albumin: 3.4 g/dL — ABNORMAL LOW (ref 3.5–5.0)
Alkaline Phosphatase: 158 U/L — ABNORMAL HIGH (ref 38–126)
Bilirubin, Direct: 0.2 mg/dL (ref 0.0–0.2)
Indirect Bilirubin: 0.7 mg/dL (ref 0.3–0.9)
Total Bilirubin: 0.9 mg/dL (ref 0.3–1.2)
Total Protein: 6.3 g/dL — ABNORMAL LOW (ref 6.5–8.1)

## 2019-05-17 LAB — LIPID PANEL
Cholesterol: 109 mg/dL (ref 0–200)
HDL: 49 mg/dL (ref >40)
LDL Cholesterol: 42 mg/dL (ref 0–99)
Total CHOL/HDL Ratio: 2.2 RATIO
Triglycerides: 89 mg/dL (ref <150)
VLDL: 18 mg/dL (ref 0–40)

## 2019-05-17 NOTE — Telephone Encounter (Signed)
Pt left VM stating she broke both of her arms and would need a wheel chair downstairs to come for her lab appt. Pt also asked if she should be fasting for labs. I called pt back. No answer.

## 2019-05-19 ENCOUNTER — Other Ambulatory Visit: Payer: Self-pay

## 2019-05-19 ENCOUNTER — Other Ambulatory Visit: Payer: Self-pay | Admitting: Oncology

## 2019-05-19 ENCOUNTER — Inpatient Hospital Stay: Payer: Medicare PPO | Attending: Oncology

## 2019-05-19 ENCOUNTER — Inpatient Hospital Stay: Payer: Medicare PPO

## 2019-05-19 VITALS — BP 121/58 | HR 70 | Temp 99.1°F | Resp 18

## 2019-05-19 DIAGNOSIS — C50411 Malignant neoplasm of upper-outer quadrant of right female breast: Secondary | ICD-10-CM | POA: Diagnosis not present

## 2019-05-19 DIAGNOSIS — S52571D Other intraarticular fracture of lower end of right radius, subsequent encounter for closed fracture with routine healing: Secondary | ICD-10-CM | POA: Diagnosis not present

## 2019-05-19 DIAGNOSIS — I129 Hypertensive chronic kidney disease with stage 1 through stage 4 chronic kidney disease, or unspecified chronic kidney disease: Secondary | ICD-10-CM | POA: Diagnosis not present

## 2019-05-19 DIAGNOSIS — E11319 Type 2 diabetes mellitus with unspecified diabetic retinopathy without macular edema: Secondary | ICD-10-CM | POA: Diagnosis not present

## 2019-05-19 DIAGNOSIS — C787 Secondary malignant neoplasm of liver and intrahepatic bile duct: Secondary | ICD-10-CM

## 2019-05-19 DIAGNOSIS — D631 Anemia in chronic kidney disease: Secondary | ICD-10-CM | POA: Diagnosis not present

## 2019-05-19 DIAGNOSIS — E1122 Type 2 diabetes mellitus with diabetic chronic kidney disease: Secondary | ICD-10-CM | POA: Diagnosis not present

## 2019-05-19 DIAGNOSIS — Z5112 Encounter for antineoplastic immunotherapy: Secondary | ICD-10-CM | POA: Insufficient documentation

## 2019-05-19 DIAGNOSIS — M21372 Foot drop, left foot: Secondary | ICD-10-CM | POA: Diagnosis not present

## 2019-05-19 DIAGNOSIS — Z171 Estrogen receptor negative status [ER-]: Secondary | ICD-10-CM

## 2019-05-19 DIAGNOSIS — N189 Chronic kidney disease, unspecified: Secondary | ICD-10-CM | POA: Diagnosis not present

## 2019-05-19 DIAGNOSIS — S52572D Other intraarticular fracture of lower end of left radius, subsequent encounter for closed fracture with routine healing: Secondary | ICD-10-CM | POA: Diagnosis not present

## 2019-05-19 DIAGNOSIS — C50911 Malignant neoplasm of unspecified site of right female breast: Secondary | ICD-10-CM

## 2019-05-19 DIAGNOSIS — Z95828 Presence of other vascular implants and grafts: Secondary | ICD-10-CM

## 2019-05-19 DIAGNOSIS — C801 Malignant (primary) neoplasm, unspecified: Secondary | ICD-10-CM

## 2019-05-19 DIAGNOSIS — E1142 Type 2 diabetes mellitus with diabetic polyneuropathy: Secondary | ICD-10-CM | POA: Diagnosis not present

## 2019-05-19 LAB — CBC WITH DIFFERENTIAL/PLATELET
Abs Immature Granulocytes: 0.03 10*3/uL (ref 0.00–0.07)
Basophils Absolute: 0 10*3/uL (ref 0.0–0.1)
Basophils Relative: 0 %
Eosinophils Absolute: 0.1 10*3/uL (ref 0.0–0.5)
Eosinophils Relative: 2 %
HCT: 32.4 % — ABNORMAL LOW (ref 36.0–46.0)
Hemoglobin: 10.6 g/dL — ABNORMAL LOW (ref 12.0–15.0)
Immature Granulocytes: 0 %
Lymphocytes Relative: 22 %
Lymphs Abs: 1.5 10*3/uL (ref 0.7–4.0)
MCH: 31.5 pg (ref 26.0–34.0)
MCHC: 32.7 g/dL (ref 30.0–36.0)
MCV: 96.1 fL (ref 80.0–100.0)
Monocytes Absolute: 0.5 10*3/uL (ref 0.1–1.0)
Monocytes Relative: 8 %
Neutro Abs: 4.5 10*3/uL (ref 1.7–7.7)
Neutrophils Relative %: 68 %
Platelets: 223 10*3/uL (ref 150–400)
RBC: 3.37 MIL/uL — ABNORMAL LOW (ref 3.87–5.11)
RDW: 13.3 % (ref 11.5–15.5)
WBC: 6.7 10*3/uL (ref 4.0–10.5)
nRBC: 0 % (ref 0.0–0.2)

## 2019-05-19 LAB — COMPREHENSIVE METABOLIC PANEL
ALT: 161 U/L — ABNORMAL HIGH (ref 0–44)
AST: 115 U/L — ABNORMAL HIGH (ref 15–41)
Albumin: 3.5 g/dL (ref 3.5–5.0)
Alkaline Phosphatase: 178 U/L — ABNORMAL HIGH (ref 38–126)
Anion gap: 6 (ref 5–15)
BUN: 34 mg/dL — ABNORMAL HIGH (ref 8–23)
CO2: 22 mmol/L (ref 22–32)
Calcium: 8.9 mg/dL (ref 8.9–10.3)
Chloride: 112 mmol/L — ABNORMAL HIGH (ref 98–111)
Creatinine, Ser: 1.63 mg/dL — ABNORMAL HIGH (ref 0.44–1.00)
GFR calc Af Amer: 38 mL/min — ABNORMAL LOW (ref >60)
GFR calc non Af Amer: 32 mL/min — ABNORMAL LOW (ref >60)
Glucose, Bld: 196 mg/dL — ABNORMAL HIGH (ref 70–99)
Potassium: 4.5 mmol/L (ref 3.5–5.1)
Sodium: 140 mmol/L (ref 135–145)
Total Bilirubin: 0.5 mg/dL (ref 0.3–1.2)
Total Protein: 6.3 g/dL — ABNORMAL LOW (ref 6.5–8.1)

## 2019-05-19 LAB — CEA (IN HOUSE-CHCC): CEA (CHCC-In House): 2.86 ng/mL (ref 0.00–5.00)

## 2019-05-19 MED ORDER — DIPHENHYDRAMINE HCL 25 MG PO CAPS
25.0000 mg | ORAL_CAPSULE | Freq: Once | ORAL | Status: AC
  Administered 2019-05-19: 25 mg via ORAL

## 2019-05-19 MED ORDER — ACETAMINOPHEN 325 MG PO TABS
ORAL_TABLET | ORAL | Status: AC
  Filled 2019-05-19: qty 2

## 2019-05-19 MED ORDER — SODIUM CHLORIDE 0.9% FLUSH
10.0000 mL | INTRAVENOUS | Status: DC | PRN
  Administered 2019-05-19: 10 mL via INTRAVENOUS
  Filled 2019-05-19: qty 10

## 2019-05-19 MED ORDER — SODIUM CHLORIDE 0.9 % IV SOLN
Freq: Once | INTRAVENOUS | Status: AC
  Filled 2019-05-19: qty 250

## 2019-05-19 MED ORDER — SODIUM CHLORIDE 0.9% FLUSH
10.0000 mL | INTRAVENOUS | Status: DC | PRN
  Administered 2019-05-19: 10 mL
  Filled 2019-05-19: qty 10

## 2019-05-19 MED ORDER — ACETAMINOPHEN 325 MG PO TABS
650.0000 mg | ORAL_TABLET | Freq: Once | ORAL | Status: AC
  Administered 2019-05-19: 650 mg via ORAL

## 2019-05-19 MED ORDER — DIPHENHYDRAMINE HCL 25 MG PO CAPS
ORAL_CAPSULE | ORAL | Status: AC
  Filled 2019-05-19: qty 1

## 2019-05-19 MED ORDER — TRASTUZUMAB-ANNS CHEMO 420 MG IV SOLR
450.0000 mg | Freq: Once | INTRAVENOUS | Status: AC
  Administered 2019-05-19: 450 mg via INTRAVENOUS
  Filled 2019-05-19: qty 21.43

## 2019-05-19 MED ORDER — HEPARIN SOD (PORK) LOCK FLUSH 100 UNIT/ML IV SOLN
500.0000 [IU] | Freq: Once | INTRAVENOUS | Status: AC | PRN
  Administered 2019-05-19: 500 [IU]
  Filled 2019-05-19: qty 5

## 2019-05-19 MED ORDER — TRASTUZUMAB-ANNS CHEMO 420 MG IV SOLR: 6.0000 mg/kg | Freq: Once | INTRAVENOUS | Status: DC

## 2019-05-19 MED ORDER — SODIUM CHLORIDE 0.9 % IV SOLN
420.0000 mg | Freq: Once | INTRAVENOUS | Status: AC
  Administered 2019-05-19: 420 mg via INTRAVENOUS
  Filled 2019-05-19: qty 14

## 2019-05-19 NOTE — Progress Notes (Signed)
Patient declined to stay for 30 minute post observation period. VSS upon leaving unit.  

## 2019-05-19 NOTE — Patient Instructions (Signed)
[facility] Discharge Instructions for Patients Receiving Chemotherapy  Today you received the following chemotherapy agents: trastuzumab and pertuzumab.  To help prevent nausea and vomiting after your treatment, we encourage you to take your nausea medication as directed.   If you develop nausea and vomiting that is not controlled by your nausea medication, call the clinic.   BELOW ARE SYMPTOMS THAT SHOULD BE REPORTED IMMEDIATELY:  *FEVER GREATER THAN 100.5 F  *CHILLS WITH OR WITHOUT FEVER  NAUSEA AND VOMITING THAT IS NOT CONTROLLED WITH YOUR NAUSEA MEDICATION  *UNUSUAL SHORTNESS OF BREATH  *UNUSUAL BRUISING OR BLEEDING  TENDERNESS IN MOUTH AND THROAT WITH OR WITHOUT PRESENCE OF ULCERS  *URINARY PROBLEMS  *BOWEL PROBLEMS  UNUSUAL RASH Items with * indicate a potential emergency and should be followed up as soon as possible.  Feel free to call the clinic should you have any questions or concerns. The clinic phone number is [phone].  Please show the CHEMO ALERT CARD at check-in to the Emergency Department and triage nurse.   

## 2019-05-21 DIAGNOSIS — S52502D Unspecified fracture of the lower end of left radius, subsequent encounter for closed fracture with routine healing: Secondary | ICD-10-CM | POA: Diagnosis not present

## 2019-05-21 DIAGNOSIS — S52501D Unspecified fracture of the lower end of right radius, subsequent encounter for closed fracture with routine healing: Secondary | ICD-10-CM | POA: Diagnosis not present

## 2019-05-24 DIAGNOSIS — I129 Hypertensive chronic kidney disease with stage 1 through stage 4 chronic kidney disease, or unspecified chronic kidney disease: Secondary | ICD-10-CM | POA: Diagnosis not present

## 2019-05-24 DIAGNOSIS — N189 Chronic kidney disease, unspecified: Secondary | ICD-10-CM | POA: Diagnosis not present

## 2019-05-24 DIAGNOSIS — E1122 Type 2 diabetes mellitus with diabetic chronic kidney disease: Secondary | ICD-10-CM | POA: Diagnosis not present

## 2019-05-24 DIAGNOSIS — S52571D Other intraarticular fracture of lower end of right radius, subsequent encounter for closed fracture with routine healing: Secondary | ICD-10-CM | POA: Diagnosis not present

## 2019-05-24 DIAGNOSIS — E11319 Type 2 diabetes mellitus with unspecified diabetic retinopathy without macular edema: Secondary | ICD-10-CM | POA: Diagnosis not present

## 2019-05-24 DIAGNOSIS — D631 Anemia in chronic kidney disease: Secondary | ICD-10-CM | POA: Diagnosis not present

## 2019-05-24 DIAGNOSIS — E1142 Type 2 diabetes mellitus with diabetic polyneuropathy: Secondary | ICD-10-CM | POA: Diagnosis not present

## 2019-05-24 DIAGNOSIS — M21372 Foot drop, left foot: Secondary | ICD-10-CM | POA: Diagnosis not present

## 2019-05-24 DIAGNOSIS — S52572D Other intraarticular fracture of lower end of left radius, subsequent encounter for closed fracture with routine healing: Secondary | ICD-10-CM | POA: Diagnosis not present

## 2019-05-26 ENCOUNTER — Telehealth (HOSPITAL_COMMUNITY): Payer: Self-pay | Admitting: *Deleted

## 2019-05-26 DIAGNOSIS — S52571D Other intraarticular fracture of lower end of right radius, subsequent encounter for closed fracture with routine healing: Secondary | ICD-10-CM | POA: Diagnosis not present

## 2019-05-26 DIAGNOSIS — E1122 Type 2 diabetes mellitus with diabetic chronic kidney disease: Secondary | ICD-10-CM | POA: Diagnosis not present

## 2019-05-26 DIAGNOSIS — E1142 Type 2 diabetes mellitus with diabetic polyneuropathy: Secondary | ICD-10-CM | POA: Diagnosis not present

## 2019-05-26 DIAGNOSIS — M21372 Foot drop, left foot: Secondary | ICD-10-CM | POA: Diagnosis not present

## 2019-05-26 DIAGNOSIS — D631 Anemia in chronic kidney disease: Secondary | ICD-10-CM | POA: Diagnosis not present

## 2019-05-26 DIAGNOSIS — C787 Secondary malignant neoplasm of liver and intrahepatic bile duct: Secondary | ICD-10-CM

## 2019-05-26 DIAGNOSIS — N189 Chronic kidney disease, unspecified: Secondary | ICD-10-CM | POA: Diagnosis not present

## 2019-05-26 DIAGNOSIS — E11319 Type 2 diabetes mellitus with unspecified diabetic retinopathy without macular edema: Secondary | ICD-10-CM | POA: Diagnosis not present

## 2019-05-26 DIAGNOSIS — I129 Hypertensive chronic kidney disease with stage 1 through stage 4 chronic kidney disease, or unspecified chronic kidney disease: Secondary | ICD-10-CM | POA: Diagnosis not present

## 2019-05-26 DIAGNOSIS — S52572D Other intraarticular fracture of lower end of left radius, subsequent encounter for closed fracture with routine healing: Secondary | ICD-10-CM | POA: Diagnosis not present

## 2019-05-26 NOTE — Telephone Encounter (Signed)
Darlene [name], Darlene [name]  05/26/2019 11:32 AM EDT    Labs added to oncology labs on 5/18. Pt aware of results.    Valeda Malm, RN  05/26/2019 8:59 AM EDT    LM for patient to return call to office   Larey Dresser, MD  05/23/2019 11:15 PM EDT    Very good LDL. LFTs are elevated, suspect due to fatty liver and possibly due to her liver mets. Would send HBV and HCV screening labs.

## 2019-05-26 NOTE — Telephone Encounter (Signed)
-----   Message from Larey Dresser, MD sent at 05/23/2019 11:15 PM EDT ----- Very good LDL.  LFTs are elevated, suspect due to fatty liver and possibly due to her liver mets.  Would send HBV and HCV screening labs.

## 2019-05-31 DIAGNOSIS — S52571D Other intraarticular fracture of lower end of right radius, subsequent encounter for closed fracture with routine healing: Secondary | ICD-10-CM | POA: Diagnosis not present

## 2019-05-31 DIAGNOSIS — E11319 Type 2 diabetes mellitus with unspecified diabetic retinopathy without macular edema: Secondary | ICD-10-CM | POA: Diagnosis not present

## 2019-05-31 DIAGNOSIS — S52572D Other intraarticular fracture of lower end of left radius, subsequent encounter for closed fracture with routine healing: Secondary | ICD-10-CM | POA: Diagnosis not present

## 2019-05-31 DIAGNOSIS — N189 Chronic kidney disease, unspecified: Secondary | ICD-10-CM | POA: Diagnosis not present

## 2019-05-31 DIAGNOSIS — D631 Anemia in chronic kidney disease: Secondary | ICD-10-CM | POA: Diagnosis not present

## 2019-05-31 DIAGNOSIS — I129 Hypertensive chronic kidney disease with stage 1 through stage 4 chronic kidney disease, or unspecified chronic kidney disease: Secondary | ICD-10-CM | POA: Diagnosis not present

## 2019-05-31 DIAGNOSIS — E1142 Type 2 diabetes mellitus with diabetic polyneuropathy: Secondary | ICD-10-CM | POA: Diagnosis not present

## 2019-05-31 DIAGNOSIS — M21372 Foot drop, left foot: Secondary | ICD-10-CM | POA: Diagnosis not present

## 2019-05-31 DIAGNOSIS — E1122 Type 2 diabetes mellitus with diabetic chronic kidney disease: Secondary | ICD-10-CM | POA: Diagnosis not present

## 2019-06-02 ENCOUNTER — Encounter: Payer: Self-pay | Admitting: Oncology

## 2019-06-02 DIAGNOSIS — D631 Anemia in chronic kidney disease: Secondary | ICD-10-CM | POA: Diagnosis not present

## 2019-06-02 DIAGNOSIS — E11319 Type 2 diabetes mellitus with unspecified diabetic retinopathy without macular edema: Secondary | ICD-10-CM | POA: Diagnosis not present

## 2019-06-02 DIAGNOSIS — E1142 Type 2 diabetes mellitus with diabetic polyneuropathy: Secondary | ICD-10-CM | POA: Diagnosis not present

## 2019-06-02 DIAGNOSIS — I129 Hypertensive chronic kidney disease with stage 1 through stage 4 chronic kidney disease, or unspecified chronic kidney disease: Secondary | ICD-10-CM | POA: Diagnosis not present

## 2019-06-02 DIAGNOSIS — M21372 Foot drop, left foot: Secondary | ICD-10-CM | POA: Diagnosis not present

## 2019-06-02 DIAGNOSIS — N189 Chronic kidney disease, unspecified: Secondary | ICD-10-CM | POA: Diagnosis not present

## 2019-06-02 DIAGNOSIS — E1122 Type 2 diabetes mellitus with diabetic chronic kidney disease: Secondary | ICD-10-CM | POA: Diagnosis not present

## 2019-06-02 DIAGNOSIS — S52571D Other intraarticular fracture of lower end of right radius, subsequent encounter for closed fracture with routine healing: Secondary | ICD-10-CM | POA: Diagnosis not present

## 2019-06-02 DIAGNOSIS — S52572D Other intraarticular fracture of lower end of left radius, subsequent encounter for closed fracture with routine healing: Secondary | ICD-10-CM | POA: Diagnosis not present

## 2019-06-07 DIAGNOSIS — S52572D Other intraarticular fracture of lower end of left radius, subsequent encounter for closed fracture with routine healing: Secondary | ICD-10-CM | POA: Diagnosis not present

## 2019-06-07 DIAGNOSIS — E11319 Type 2 diabetes mellitus with unspecified diabetic retinopathy without macular edema: Secondary | ICD-10-CM | POA: Diagnosis not present

## 2019-06-07 DIAGNOSIS — S52571D Other intraarticular fracture of lower end of right radius, subsequent encounter for closed fracture with routine healing: Secondary | ICD-10-CM | POA: Diagnosis not present

## 2019-06-07 DIAGNOSIS — E1122 Type 2 diabetes mellitus with diabetic chronic kidney disease: Secondary | ICD-10-CM | POA: Diagnosis not present

## 2019-06-07 DIAGNOSIS — I129 Hypertensive chronic kidney disease with stage 1 through stage 4 chronic kidney disease, or unspecified chronic kidney disease: Secondary | ICD-10-CM | POA: Diagnosis not present

## 2019-06-07 DIAGNOSIS — D631 Anemia in chronic kidney disease: Secondary | ICD-10-CM | POA: Diagnosis not present

## 2019-06-07 DIAGNOSIS — E1142 Type 2 diabetes mellitus with diabetic polyneuropathy: Secondary | ICD-10-CM | POA: Diagnosis not present

## 2019-06-07 DIAGNOSIS — M21372 Foot drop, left foot: Secondary | ICD-10-CM | POA: Diagnosis not present

## 2019-06-07 DIAGNOSIS — N189 Chronic kidney disease, unspecified: Secondary | ICD-10-CM | POA: Diagnosis not present

## 2019-06-08 ENCOUNTER — Other Ambulatory Visit: Payer: Self-pay

## 2019-06-08 ENCOUNTER — Encounter: Payer: Self-pay | Admitting: Adult Health

## 2019-06-08 ENCOUNTER — Inpatient Hospital Stay: Payer: Medicare PPO

## 2019-06-08 ENCOUNTER — Inpatient Hospital Stay: Payer: Medicare PPO | Attending: Oncology

## 2019-06-08 ENCOUNTER — Inpatient Hospital Stay: Payer: Medicare PPO | Admitting: Adult Health

## 2019-06-08 ENCOUNTER — Other Ambulatory Visit (INDEPENDENT_AMBULATORY_CARE_PROVIDER_SITE_OTHER): Payer: Self-pay | Admitting: Family Medicine

## 2019-06-08 VITALS — BP 116/72 | HR 54 | Temp 98.2°F | Resp 18 | Ht 69.0 in | Wt 175.1 lb

## 2019-06-08 DIAGNOSIS — C50411 Malignant neoplasm of upper-outer quadrant of right female breast: Secondary | ICD-10-CM

## 2019-06-08 DIAGNOSIS — Z5112 Encounter for antineoplastic immunotherapy: Secondary | ICD-10-CM | POA: Insufficient documentation

## 2019-06-08 DIAGNOSIS — Z171 Estrogen receptor negative status [ER-]: Secondary | ICD-10-CM | POA: Insufficient documentation

## 2019-06-08 DIAGNOSIS — E1121 Type 2 diabetes mellitus with diabetic nephropathy: Secondary | ICD-10-CM

## 2019-06-08 DIAGNOSIS — C787 Secondary malignant neoplasm of liver and intrahepatic bile duct: Secondary | ICD-10-CM

## 2019-06-08 DIAGNOSIS — C801 Malignant (primary) neoplasm, unspecified: Secondary | ICD-10-CM

## 2019-06-08 DIAGNOSIS — Z95828 Presence of other vascular implants and grafts: Secondary | ICD-10-CM

## 2019-06-08 DIAGNOSIS — C50911 Malignant neoplasm of unspecified site of right female breast: Secondary | ICD-10-CM

## 2019-06-08 DIAGNOSIS — Z794 Long term (current) use of insulin: Secondary | ICD-10-CM

## 2019-06-08 LAB — CBC WITH DIFFERENTIAL/PLATELET
Abs Immature Granulocytes: 0.02 10*3/uL (ref 0.00–0.07)
Basophils Absolute: 0 10*3/uL (ref 0.0–0.1)
Basophils Relative: 0 %
Eosinophils Absolute: 0.2 10*3/uL (ref 0.0–0.5)
Eosinophils Relative: 3 %
HCT: 31.4 % — ABNORMAL LOW (ref 36.0–46.0)
Hemoglobin: 10.4 g/dL — ABNORMAL LOW (ref 12.0–15.0)
Immature Granulocytes: 0 %
Lymphocytes Relative: 23 %
Lymphs Abs: 1.3 10*3/uL (ref 0.7–4.0)
MCH: 31.9 pg (ref 26.0–34.0)
MCHC: 33.1 g/dL (ref 30.0–36.0)
MCV: 96.3 fL (ref 80.0–100.0)
Monocytes Absolute: 0.4 10*3/uL (ref 0.1–1.0)
Monocytes Relative: 8 %
Neutro Abs: 3.7 10*3/uL (ref 1.7–7.7)
Neutrophils Relative %: 66 %
Platelets: 152 10*3/uL (ref 150–400)
RBC: 3.26 MIL/uL — ABNORMAL LOW (ref 3.87–5.11)
RDW: 13.2 % (ref 11.5–15.5)
WBC: 5.6 10*3/uL (ref 4.0–10.5)
nRBC: 0 % (ref 0.0–0.2)

## 2019-06-08 LAB — COMPREHENSIVE METABOLIC PANEL
ALT: 245 U/L — ABNORMAL HIGH (ref 0–44)
AST: 125 U/L — ABNORMAL HIGH (ref 15–41)
Albumin: 3.5 g/dL (ref 3.5–5.0)
Alkaline Phosphatase: 156 U/L — ABNORMAL HIGH (ref 38–126)
Anion gap: 8 (ref 5–15)
BUN: 30 mg/dL — ABNORMAL HIGH (ref 8–23)
CO2: 22 mmol/L (ref 22–32)
Calcium: 8.7 mg/dL — ABNORMAL LOW (ref 8.9–10.3)
Chloride: 112 mmol/L — ABNORMAL HIGH (ref 98–111)
Creatinine, Ser: 1.57 mg/dL — ABNORMAL HIGH (ref 0.44–1.00)
GFR calc Af Amer: 39 mL/min — ABNORMAL LOW (ref >60)
GFR calc non Af Amer: 34 mL/min — ABNORMAL LOW (ref >60)
Glucose, Bld: 69 mg/dL — ABNORMAL LOW (ref 70–99)
Potassium: 4.4 mmol/L (ref 3.5–5.1)
Sodium: 142 mmol/L (ref 135–145)
Total Bilirubin: 0.6 mg/dL (ref 0.3–1.2)
Total Protein: 6 g/dL — ABNORMAL LOW (ref 6.5–8.1)

## 2019-06-08 LAB — HEPATITIS B CORE ANTIBODY, TOTAL: Hep B Core Total Ab: NONREACTIVE

## 2019-06-08 LAB — HEPATITIS B SURFACE ANTIBODY,QUALITATIVE: Hep B S Ab: NONREACTIVE

## 2019-06-08 MED ORDER — ACETAMINOPHEN 325 MG PO TABS
650.0000 mg | ORAL_TABLET | Freq: Once | ORAL | Status: AC
Start: 2019-06-08 — End: 2019-06-08
  Administered 2019-06-08: 650 mg via ORAL

## 2019-06-08 MED ORDER — DIPHENHYDRAMINE HCL 25 MG PO CAPS
ORAL_CAPSULE | ORAL | Status: AC
  Filled 2019-06-08: qty 1

## 2019-06-08 MED ORDER — HEPARIN SOD (PORK) LOCK FLUSH 100 UNIT/ML IV SOLN
500.0000 [IU] | Freq: Once | INTRAVENOUS | Status: AC | PRN
  Administered 2019-06-08: 500 [IU]
  Filled 2019-06-08: qty 5

## 2019-06-08 MED ORDER — TRASTUZUMAB-ANNS CHEMO 420 MG IV SOLR
450.0000 mg | Freq: Once | INTRAVENOUS | Status: AC
  Administered 2019-06-08: 450 mg via INTRAVENOUS
  Filled 2019-06-08: qty 21.43

## 2019-06-08 MED ORDER — SODIUM CHLORIDE 0.9% FLUSH
10.0000 mL | INTRAVENOUS | Status: DC | PRN
  Administered 2019-06-08: 10 mL via INTRAVENOUS
  Filled 2019-06-08: qty 10

## 2019-06-08 MED ORDER — SODIUM CHLORIDE 0.9 % IV SOLN
Freq: Once | INTRAVENOUS | Status: AC
  Filled 2019-06-08: qty 250

## 2019-06-08 MED ORDER — ACETAMINOPHEN 325 MG PO TABS
ORAL_TABLET | ORAL | Status: AC
  Filled 2019-06-08: qty 2

## 2019-06-08 MED ORDER — SODIUM CHLORIDE 0.9% FLUSH
10.0000 mL | INTRAVENOUS | Status: DC | PRN
  Administered 2019-06-08: 10 mL
  Filled 2019-06-08: qty 10

## 2019-06-08 MED ORDER — SODIUM CHLORIDE 0.9 % IV SOLN
420.0000 mg | Freq: Once | INTRAVENOUS | Status: AC
  Administered 2019-06-08: 420 mg via INTRAVENOUS
  Filled 2019-06-08: qty 14

## 2019-06-08 MED ORDER — DIPHENHYDRAMINE HCL 25 MG PO CAPS
25.0000 mg | ORAL_CAPSULE | Freq: Once | ORAL | Status: AC
  Administered 2019-06-08: 25 mg via ORAL

## 2019-06-08 NOTE — Progress Notes (Signed)
[facility]  [phone] [phone]    ID: Darlene [name] DOB: [birth year]  MR#: [id number]  FYB#:[id number]  Patient Care Team: Vernie Shanks, MD as PCP - General (Family Medicine) Larey Dresser, MD as PCP - Advanced Heart Failure (Cardiology) Stark Klein, MD as Consulting Physician (General Surgery) Magrinat, Virgie Dad, MD as Consulting Physician (Oncology) Jacelyn Pi, MD as Consulting Physician (Endocrinology) [name], MD as Consulting Physician (Plastic Surgery) Gery Pray, MD as Consulting Physician (Radiation Oncology) Juanita Craver, MD as Consulting Physician (Gastroenterology) Alda Berthold, DO as Consulting Physician (Neurology) Starlyn Skeans, MD as Consulting Physician (Family Medicine) Sherlynn Stalls, MD as Consulting Physician (Ophthalmology)  OTHER MD:   CHIEF COMPLAINT:  HER-2 positive, estrogen receptor negative locally recurrent disease  CURRENT TREATMENT:  Trastuzumab; Pertuzumab   INTERVAL HISTORY:  Darlene [name] returns today for follow-up and treatment of her estrogen receptor negative but HER-2 amplified breast cancer.   She continues on Trastuzumab/Pertuzumab every 4 weeks.  She is tolerating this well, and the plan is to continue indefinitely so long as there is no evidence of disease recurrence.  Since I saw her last she fell on 4/11 and broke her wrists, she did require surgery.  She notes that her diabetic neuropathy and drop foot was significant and she got unbalanced while going up steps and then fell.  She is recovering well and is wearing two wrist braces and a foot brace to help her.    REVIEW OF SYSTEMS: Darlene [name] is doing well today.  She notes she underwent a cardiac scan with Dr. Aundra Dubin that noted she had atherosclerosis.  She was started on crestor.  He ordered some additional hepatitis serologies, and lynn wants me to f/u and ensure those were drawn.  Darlene [name] is otherwise doing well and regaining  her strength.  She is not having any more diarrhea.  She is undergoing regular eye injections with Iylea for her macular edema.  A detailed ROS was otherwise non contributory.    BREAST CANCER HISTORY: From the original intake note:  "Darlene [name]" had screening mammography at her gynecologist suggestive of a possible abnormality in the right breast. However right diagnostic mammogram 04/12/2013 at [facility] was negative. More recently however, she noted a lump in her right breast and brought it to her gynecologist's attention. On 07/18/2014 the patient had bilateral diagnostic mammography with tomosynthesis at [facility]. The breast density was category B. In the right breast upper outer quadrant there was an area of focal asymmetry with indistinct margins.[name] Ultrasound was obtained and showed in addition to the mass in question, measuring 1.7 cm, an abnormal-appearing lymph node in the right axillary tail.  On 07/19/2014 the patient underwent biopsy of the right breast massin question as well as a right axillary lymph node. Both were positive for invasive ductal carcinoma, grade 2, estrogen and progesterone receptor negative, with an MIB-1 of 90%, and HER-2 pending. Incidentally the lymph node biopsy showed a lymphocytic inflammatory component but no lymph node architecture.  Her subsequent history is as detailed above.   PAST MEDICAL HISTORY: Past Medical History:  Diagnosis Date  . Anemia    hx of - during 1st round chemo  . Anxiety   . Arthritis    in fingers, shoulders  . Back pain   . Balance problem   . Breast cancer ([place])   . Breast cancer of upper-outer quadrant of right female breast ([place]) 07/22/2014  . Bunion, right foot   . Chronic kidney  disease    creatininei levels high per pt   . Clumsiness   . Constipation   . Depression   . Diabetes mellitus without complication ([place])    21+ years type 2   . Diabetic retinopathy ([place])    torn retina  . Eating disorder    binge eating  . Epistaxis  08/26/2014  . Fatty liver   . HCAP (healthcare-associated pneumonia) 12/18/2014  . Heart murmur    never had any problems  . History of radiation therapy 04/05/15-05/19/15   right chest wall was treated to 50.4 Gy in 28 fractions, right mastectomy scar was treated to 10 Gy in 5 fractions  . Hyperlipidemia   . Hypertension   . Joint pain   . Multiple food allergies   . Neuromuscular disorder ([place])    diabetic neuropathy  . Obesity   . Obstructive sleep apnea on CPAP    does not use cpap all the time has lost 160 lbs   . Ovarian cyst rupture    possible  . Pneumonia   . Pneumonia 11/2014  . Radiation 04/05/15-05/19/15   right chest wall 50.4 Gy, mastectomy scar 10 Gy  . Sleep apnea   . Thyroid nodule 06/2014    PAST SURGICAL HISTORY: Past Surgical History:  Procedure Laterality Date  . APPENDECTOMY    . BIOPSY THYROID Bilateral 06/2014   2 nodules - Benign   . BREAST LUMPECTOMY WITH RADIOACTIVE SEED AND SENTINEL LYMPH NODE BIOPSY Right 12/27/2014   Procedure: BREAST LUMPECTOMY WITH RADIOACTIVE SEED AND SENTINEL LYMPH NODE BIOPSY;  Surgeon: Stark Klein, MD;  Location: [facility];  Service: General;  Laterality: Right;  . BREAST REDUCTION SURGERY Bilateral 01/06/2015   Procedure: BILATERAL BREAST REDUCTION, RIGHT ONCOPLASTIC RECONSTRUCTION),LEFT BREAST REDUCTION FOR SYMMETRY;  Surgeon: [name], MD;  Location: [facility];  Service: Plastics;  Laterality: Bilateral;  . BREATH TEK H PYLORI N/A 09/15/2012   Procedure: BREATH TEK H PYLORI;  Surgeon: Pedro Earls, MD;  Location: [facility];  Service: General;  Laterality: N/A;  . BUNIONECTOMY Left 12/2013  . CARPAL TUNNEL RELEASE Right   . CARPAL TUNNEL RELEASE Left   . CATARACT EXTRACTION     6/19  . COLONOSCOPY    . DUPUYTREN CONTRACTURE RELEASE    . GASTRIC ROUX-EN-Y N/A 11/02/2012   Procedure: LAPAROSCOPIC ROUX-EN-Y GASTRIC;  Surgeon: Pedro Earls, MD;  Location: [facility];  Service: General;  Laterality: N/A;   . IR GENERIC HISTORICAL  03/18/2016   IR US GUIDE VASC ACCESS LEFT 03/18/2016 Markus Daft, MD [facility]  . IR GENERIC HISTORICAL  03/18/2016   IR FLUORO GUIDE CV LINE LEFT 03/18/2016 Markus Daft, MD [facility]  . LAPAROSCOPIC APPENDECTOMY N/A 07/22/2015   Procedure: APPENDECTOMY LAPAROSCOPIC;  Surgeon: Michael Boston, MD;  [facility];  Service: General;  Laterality: N/A;  . MASTECTOMY Right 02/15/2015   modified  . MODIFIED MASTECTOMY Right 02/15/2015   Procedure: RIGHT MODIFIED RADICAL MASTECTOMY;  Surgeon: Stark Klein, MD;  Location: [facility];  Service: General;  Laterality: Right;  . ORIF WRIST FRACTURE Left 05/11/2019   Procedure: OPEN REDUCTION INTERNAL FIXATION (ORIF) left distal radius fracture;  Surgeon: Renette Butters, MD;  Location: [facility];  Service: Orthopedics;  Laterality: Left;  with block  . PORT-A-CATH REMOVAL Left 10/10/2015   Procedure: PORT REMOVAL;  Surgeon: Stark Klein, MD;  Location: [facility];  Service: General;  Laterality: Left;  . PORTACATH PLACEMENT Left 08/02/2014   Procedure: INSERTION PORT-A-CATH;  Surgeon: Stark Klein, MD;  Location: [facility];  Service: General;  Laterality: Left;  . PORTACATH PLACEMENT N/A 11/05/2016   Procedure: INSERTION PORT-A-CATH;  Surgeon: Stark Klein, MD;  Location: [facility];  Service: General;  Laterality: N/A;  . RETINAL LASER PROCEDURE     retina tear on left eye  . ROTATOR CUFF REPAIR Right 2005  . SCAR REVISION Right 12/08/2015   Procedure: COMPLEX REPAIR RIGHT CHEST 10-15CM;  Surgeon: [name], MD;  Location: [facility];  Service: Plastics;  Laterality: Right;  . TOE SURGERY  1970s   bone spur  . TRIGGER FINGER RELEASE     x3    FAMILY HISTORY Family History  Problem Relation Age of Onset  . CVA Mother   . Heart disease Mother   . Sudden death Mother   . Diabetes Father   . Heart failure Father   . Hypertension Father   . Kidney disease Father   . Obesity Father   . Hypertension  Sister   . Diabetes Brother   . Breast cancer Paternal Grandmother 21  . Diabetes Paternal Uncle   . Ovarian cancer Neg Hx    The patient's father died from complications of diabetes at age 53. The patient's mother died at age 69 with a subarachnoid hemorrhage. The patient had one brother, one sister. The only breast cancer was the patient's paternal grandmother diagnosed in her 89s. There is no history of ovarian cancer in the family.   GYNECOLOGIC HISTORY:  Patient's last menstrual period was 10/22/2007.  menarche age 40, the patient is GX P0. She went through the change of life in 2011. She did not take hormone replacement.   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 Her husband [name] is a retired Visual merchandiser (he taught at [employer]).  In May 2020 they moved to [facility] in the [place] area.  [name] has 2 children from an earlier marriage, Rockey Situ who teaches in [place], and Chastelyn Athens,  who works at the [employer] in in [place]. He has 1 grand son, aged 90. The patient and her husband are not church attenders.   ADVANCED DIRECTIVES:  Not in place   HEALTH MAINTENANCE: Social History   Tobacco Use  . Smoking status: Never Smoker  . Smokeless tobacco: Never Used  Substance Use Topics  . Alcohol use: Yes    Alcohol/week: 1.0 - 2.0 standard drinks    Types: 1 - 2 Standard drinks or equivalent per week    Comment: social  . Drug use: No     Colonoscopy:  May 2016  PAP:  Bone density: 07/01/2016 at [facility] showed normal, with a T score of -0.7  Lipid panel:  Allergies  Allergen Reactions  . Nsaids Other (See Comments)    H/o gastric bypass - avoid NSAIDs  . Tape Hives, Rash and Other (See Comments)    Adhesives in bandaids    Current Outpatient Medications  Medication Sig Dispense Refill    . Accu-Chek Softclix Lancets lancets 1 each by Other route 4 (four) times daily. Test blood sugars 4 x times daily 100 each 1  . aspirin EC 81 MG tablet Take 1 tablet (81 mg total) by mouth daily. 90 tablet 3  . Blood Glucose Calibration (ACCU-CHEK GUIDE CONTROL) LIQD 1 Bottle by In Vitro route as needed.  1 each 0  . Blood Glucose Monitoring Suppl (ACCU-CHEK GUIDE) w/Device KIT 1 each by Does not apply route 4 (four) times daily. Use to test blood sugars 4x a day 1 kit 0  . buPROPion (WELLBUTRIN SR) 200 MG 12 hr tablet TAKE 1 TABLET AT 12 NOON. (Patient taking differently: Take 200 mg by mouth at bedtime. ) 90 tablet 0  . CALCIUM CITRATE-VITAMIN D PO Take 1 tablet by mouth 3 (three) times daily.     . carvedilol (COREG) 3.125 MG tablet TAKE 1 TABLET BY MOUTH TWICE DAILY. 180 tablet 3  . cholecalciferol (VITAMIN D) 1000 units tablet Take 1,000 Units by mouth daily.    . cholestyramine (QUESTRAN) 4 g packet Take 1 packet (4 g total) by mouth 2 (two) times daily as needed. 10 each 12  . Cyanocobalamin (VITAMIN B-12 PO) Take 1 tablet by mouth every 14 (fourteen) days.    . diphenhydrAMINE (BENADRYL) 25 MG tablet Take 25 mg by mouth daily as needed for allergies or sleep.     [name] glucose blood (ACCU-CHEK GUIDE) test strip 1 each by Other route 4 (four) times daily. Accu Chek Guide  Test blood sugars 4 times a day 100 each 1  . insulin glargine (LANTUS SOLOSTAR) 100 UNIT/ML Solostar Pen Inject 12 Units into the skin daily. 15 pen 0  . Insulin Pen Needle (SURE COMFORT PEN NEEDLES) 32G X 4 MM MISC USE WITH INJECTIONS 100 each 0  . loratadine (CLARITIN) 10 MG tablet Take 10 mg by mouth as needed for allergies.    [name] losartan (COZAAR) 100 MG tablet Take 1 tablet (100 mg total) by mouth daily. 90 tablet 0  . ondansetron (ZOFRAN) 4 MG tablet Take 1 tablet (4 mg total) by mouth every 8 (eight) hours as needed for nausea or vomiting. 20 tablet 0  . Pediatric Multivitamins-Iron (FLINTSTONES PLUS IRON) chewable  tablet Chew 1 tablet by mouth 2 (two) times daily.    . Probiotic Product (CVS PROBIOTIC PO) Take 1 capsule by mouth daily.     . prochlorperazine (COMPAZINE) 10 MG tablet Take 1 tablet (10 mg total) by mouth every 6 (six) hours as needed for nausea or vomiting. 30 tablet 0  . Propylene Glycol (SYSTANE BALANCE OP) Place 1-2 drops into both eyes 3 (three) times daily as needed (for dry eyes).     . rosuvastatin (CRESTOR) 10 MG tablet Take 1 tablet (10 mg total) by mouth daily. 90 tablet 3  . senna-docusate (SENNA PLUS) 8.6-50 MG tablet Take 1 tablet by mouth daily as needed for moderate constipation.     . topiramate (TOPAMAX) 25 MG tablet Take 1 tablet (25 mg total) by mouth daily. 90 tablet 0  . vortioxetine HBr (TRINTELLIX) 20 MG TABS tablet Take 1 tablet (20 mg total) by mouth at bedtime. 90 tablet 0  . liraglutide (VICTOZA) 18 MG/3ML SOPN INJECT 1.8MG (0.3ML) SUBCUTANEOUSLY EACH AM 9 mL 0   No current facility-administered medications for this visit.    OBJECTIVE:   Vitals:   06/08/19 1312  BP: 116/72  Pulse: (!) 54  Resp: 18  Temp: 98.2 F (36.8 C)  SpO2: 100%     Body mass index is 25.86 kg/m.     ECOG FS:1 - Symptomatic but completely ambulatory   Filed Weights   06/08/19 1312  Weight: 175 lb 1.6 oz (79.4 kg)    GENERAL: Patient is a well appearing female in no acute distress HEENT:  Sclerae anicteric. Mask in place.  Neck is supple.  NODES:  No cervical, supraclavicular, or axillary lymphadenopathy palpated.  BREAST EXAM:  Deferred. LUNGS:  Clear to auscultation bilaterally.  No wheezes or rhonchi. HEART:  Regular rate and rhythm. No murmur appreciated. ABDOMEN:  Soft, nontender.  Positive, normoactive bowel sounds. No organomegaly palpated. MSK:  No focal spinal tenderness to palpation. Full range of motion bilaterally in the upper extremities. EXTREMITIES:  No peripheral edema.   SKIN:  Clear with no obvious rashes or skin changes. No nail dyscrasia. NEURO:   Nonfocal. Well oriented.  Appropriate affect.    LAB RESULTS:  CMP     Component Value Date/Time   NA 142 06/08/2019 1253   NA 146 (H) 01/25/2019 0957   NA 140 01/07/2017 0758   K 4.4 06/08/2019 1253   K 4.1 01/07/2017 0758   CL 112 (H) 06/08/2019 1253   CO2 22 06/08/2019 1253   CO2 24 01/07/2017 0758   GLUCOSE 69 (L) 06/08/2019 1253   GLUCOSE 134 01/07/2017 0758   BUN 30 (H) 06/08/2019 1253   BUN 25 01/25/2019 0957   BUN 33.6 (H) 01/07/2017 0758   CREATININE 1.57 (H) 06/08/2019 1253   CREATININE 1.62 (H) 04/28/2018 0915   CREATININE 1.5 (H) 01/07/2017 0758   CALCIUM 8.7 (L) 06/08/2019 1253   CALCIUM 9.3 01/07/2017 0758   PROT 6.0 (L) 06/08/2019 1253   PROT 6.6 01/25/2019 0957   PROT 6.7 01/07/2017 0758   ALBUMIN 3.5 06/08/2019 1253   ALBUMIN 4.3 01/25/2019 0957   ALBUMIN 3.6 01/07/2017 0758   AST 125 (H) 06/08/2019 1253   AST 43 (H) 04/28/2018 0915   AST 51 (H) 01/07/2017 0758   ALT 245 (H) 06/08/2019 1253   ALT 61 (H) 04/28/2018 0915   ALT 82 (H) 01/07/2017 0758   ALKPHOS 156 (H) 06/08/2019 1253   ALKPHOS 115 01/07/2017 0758   BILITOT 0.6 06/08/2019 1253   BILITOT 0.7 01/25/2019 0957   BILITOT 0.6 04/28/2018 0915   BILITOT 0.72 01/07/2017 0758   GFRNONAA 34 (L) 06/08/2019 1253   GFRNONAA 33 (L) 04/28/2018 0915   GFRNONAA 55 (L) 02/18/2013 0827   GFRAA 39 (L) 06/08/2019 1253   GFRAA 38 (L) 04/28/2018 0915   GFRAA 63 02/18/2013 0827    INo results found for: SPEP, UPEP  Lab Results  Component Value Date   WBC 5.6 06/08/2019   NEUTROABS 3.7 06/08/2019   HGB 10.4 (L) 06/08/2019   HCT 31.4 (L) 06/08/2019   MCV 96.3 06/08/2019   PLT 152 06/08/2019      Chemistry      Component Value Date/Time   NA 142 06/08/2019 1253   NA 146 (H) 01/25/2019 0957   NA 140 01/07/2017 0758   K 4.4 06/08/2019 1253   K 4.1 01/07/2017 0758   CL 112 (H) 06/08/2019 1253   CO2 22 06/08/2019 1253   CO2 24 01/07/2017 0758   BUN 30 (H) 06/08/2019 1253   BUN 25 01/25/2019  0957   BUN 33.6 (H) 01/07/2017 0758   CREATININE 1.57 (H) 06/08/2019 1253   CREATININE 1.62 (H) 04/28/2018 0915   CREATININE 1.5 (H) 01/07/2017 0758      Component Value Date/Time   CALCIUM 8.7 (L) 06/08/2019 1253   CALCIUM 9.3 01/07/2017 0758   ALKPHOS 156 (H) 06/08/2019 1253   ALKPHOS 115 01/07/2017 0758   AST 125 (H) 06/08/2019 1253   AST 43 (H) 04/28/2018 0915   AST 51 (H) 01/07/2017 0758   ALT 245 (H) 06/08/2019 1253  ALT 61 (H) 04/28/2018 0915   ALT 82 (H) 01/07/2017 0758   BILITOT 0.6 06/08/2019 1253   BILITOT 0.7 01/25/2019 0957   BILITOT 0.6 04/28/2018 0915   BILITOT 0.72 01/07/2017 0758       No results found for: LABCA2  No components found for: HKVQQ595  No results for input(s): INR in the last 168 hours.  Urinalysis    Component Value Date/Time   COLORURINE YELLOW 07/21/2015 [place] 07/21/2015 2328   LABSPEC 1.020 07/21/2015 2328   PHURINE 6.0 07/21/2015 2328   GLUCOSEU NEGATIVE 07/21/2015 2328   HGBUR TRACE (A) 07/21/2015 2328   BILIRUBINUR NEGATIVE 07/21/2015 2328   BILIRUBINUR neg 06/29/2013 1017   KETONESUR NEGATIVE 07/21/2015 2328   PROTEINUR 30 (A) 07/21/2015 2328   UROBILINOGEN negative 06/29/2013 1017   NITRITE NEGATIVE 07/21/2015 2328   LEUKOCYTESUR NEGATIVE 07/21/2015 2328    STUDIES: No results found.   ASSESSMENT: 69 y.o. [place] woman status post right breast upper outer quadrant and right axillary lymph node biopsy 07/19/2014 for a cT2  pN1, stage IIB  invasive ductal carcinoma, grade 2, estrogen and progesterone receptor negative, with an MIB-1 of 90%, and HER-2 amplified  (1) neoadjuvant chemotherapy started 08/08/2014, consisting of carboplatin, docetaxel, trastuzumab and pertuzumab given every 3 weeks 6, completed 11/21/2014  (a) breast MRI after initial 3 cycles shows a significant response  (b) breast MRI after 6 cycles shows a complete radiologic response   (2) trastuzumab continued through 10/02/2015  (to end of capecitabine therapy)  (a) echo 02/13/2015 shows an EF of 60-65%  (b)  echo 05/09/2015 shows an ejection fraction of 60-65%  (c) echocardiogram 06/27/2015 shows an ejection fraction of 55-60%.  (d) echocardiogram 01/22/2017 shows an ejection fraction of 55-60%  (3) right lumpectomy with sentinel lymph node biopsy on 12/27/14 showed a residual  ypT2 ypN1a, stage IIB invasive ductal carcinoma, grade 2, with repeat estrogen and progesterone receptors again negative  (4) status post right oncoplastic surgery (with left reduction mammoplasty) 01/06/2015 showing in the right breast multiple foci of intralymphatic carcinoma in random sections  (5)  Right modified radical mastectomy 02/15/2014 showed no residual invasive disease; there was DCIS but margins were negative; all 15 axillary lymph nodes were clear; ypTis ypN0  (a)  The patient has decided against reconstruction.  (6)  Postmastectomy radiation completed 05/18/2015 with capecitabine sensitization  (7) adjuvant capecitabine continued through 09/27/2015  (8) hepatic steatosis with increased fibrosis score (at risk for cirrhosis)  (9) thyroid biopsy (left lobe inferiorly) 2 shows benign tissue 06/28/2015  (10) LOCAL RECURRENCE:  (a) PET scan 12/06/2015 Notes a right retropectoral lymph node measuring 0.9 cm with an SUV max of 5.5  (b) repeat PET scan 03/11/2016 finds the right subpectoral lymph node to now measure 1.3 cm with an SUV max of 11.8.  (c) PET scan 05/07/2016 shows a significant response to T-DM 1  (d) PET scan 07/10/2017 is clear except for what appears to be a single new liver lesion   (11) started T-DM1 03/21/2016, completed 8 doses 08/13/2016  (a) PET scan 08/19/2016 shows a good response but measurable residual disease  (b) PET scan on 01/16/2017 shows a continuing response  (12) maintenance trastuzumab started 09/03/2016--changed to every 28 days as of 01/28/2017  (a) repeat echocardiogram 07/08/2016 shows  an ejection fraction of 60-65%.  (b) echocardiogram on 10/08/2016 demonstrates LVEF of 55-60% (followed by Dr. Haroldine Laws)  (c) echo 01/22/2017 shows an ejection fraction in the 55-60% range  (  d) echo 04/24/2017 shows an ejection fraction in the 55-60% range (recommendation for echo every 6 months by Dr. Aundra Dubin)  (e) trastuzumab changed to T-DM 1 after trastuzumab dose 07/15/2017  METASTATIC DISEASE: July 2019 (13)  Radiation to the area of persistent disease in the right upper chest completed 10/30/2016   (14) T-DM 1 resumed 08/12/2017, repeated every 21 days  (a) baseline liver MRI 08/02/2017 shows a 1.4 cm mass, "hot" on PET 07/10/2017  (b) repeat liver MRI 11/18/2017 after 3 cycles of T-DM 1, shows slight enlargement  (c) T-DM 1 discontinued after 09/23/2017 dose  (15) trastuzumab resumed 11/18/2017, to be repeated every 4 weeks,   (a) changed back to every 3 weeks in 03/2018  (b) echo 09/18/2018 shows an ejection fraction in the 50-55% range.  (c) pertuzumab added with 10/06/2018 dose  (d) trastuzumab/pertuzumab changed to every 4 weeks after 01/19/2019 dose  (16) liver biopsy 11/03/2017 shows adenocarcinoma, most consistent with an upper gastrointestinal primary; this tumor was estrogen receptor and HER-2 negative  (a) GI workup for occult primary Collene Mares) negative  (b) PET 12/09/2017 shows no obvious primary.  There is a right subpectoral lymph node noted as well  (c) CA 27-29 and CA 19-9 uninformative; CEA 6.72 OCT 2019   (17) started Abraxane on 12/111/2019, given days 1 and 8 on a 21 day cycle   (a) restaging with MRI liver 02/24/2018 shows a gratifying initial response  (b) PET scan 05/14/2018 shows resolution of the measurable disease in the liver  (c) liver MRI 09/14/2018 shows no change compared to prior PET and MRI.  (d) Abraxane discontinued after 09/01/2018 dose  (18) consider Yttrium or other local treatment to liver per IR if liver recurrence develops   PLAN: Darlene [name] is  doing well today.  She continues on Trastuzumab/Pertuzumab every 4 weeks with good tolerance.  Her most recent MR liver was completed on 04/27/19 which remained stable and showed no new metastases.  She will be due for restaging in 07/2019.  Darlene [name] has been placed on crestor.  This can increase her LFTs.  Dr. Aundra Dubin had ordered hepatitis serologies.  I placed those orders today, so she could get them drawn here.  I did not order hepatitis c, as she had this drawn a couple of years ago and it was negative.  I sent Dr. Aundra Dubin her results, as her LFTs are higher than they were prior to starting the Crestor.  She is going to see him later this month.  We talked about healthy diet and exercise.  Darlene [name] has lost a large amount of weight with the assistance of medical weight and wellness.  She is no longer trying to lose weight and is focusing on maintaining her current weight.  Darlene [name] will return in 4 weeks for labs, f/u, and her next infusion.  She was recommended to continue with the appropriate pandemic precautions. She knows to call for any questions that may arise between now and her next appointment.  We are happy to see her sooner if needed.   Total encounter time 30 minutes.Wilber Bihari, NP 06/09/19 9:53 AM Medical Oncology and Hematology [facility] [place], [place] [address] Tel. [phone]    Fax. [phone]   *Total Encounter Time as defined by the Centers for Medicare and Medicaid Services includes, in addition to the face-to-face time of a patient visit (documented in the note above) non-face-to-face time: obtaining and reviewing outside history, ordering and reviewing medications, tests or  procedures, care coordination (communications with other health care professionals or caregivers) and documentation in the medical record.

## 2019-06-08 NOTE — Progress Notes (Signed)
AST 125, ALT 245, Creat.1.57 today. Okay to treat per Dr. Jana Hakim.

## 2019-06-09 ENCOUNTER — Telehealth: Payer: Self-pay | Admitting: Oncology

## 2019-06-09 DIAGNOSIS — S52571D Other intraarticular fracture of lower end of right radius, subsequent encounter for closed fracture with routine healing: Secondary | ICD-10-CM | POA: Diagnosis not present

## 2019-06-09 DIAGNOSIS — I129 Hypertensive chronic kidney disease with stage 1 through stage 4 chronic kidney disease, or unspecified chronic kidney disease: Secondary | ICD-10-CM | POA: Diagnosis not present

## 2019-06-09 DIAGNOSIS — E1122 Type 2 diabetes mellitus with diabetic chronic kidney disease: Secondary | ICD-10-CM | POA: Diagnosis not present

## 2019-06-09 DIAGNOSIS — M21372 Foot drop, left foot: Secondary | ICD-10-CM | POA: Diagnosis not present

## 2019-06-09 DIAGNOSIS — E1142 Type 2 diabetes mellitus with diabetic polyneuropathy: Secondary | ICD-10-CM | POA: Diagnosis not present

## 2019-06-09 DIAGNOSIS — S52572D Other intraarticular fracture of lower end of left radius, subsequent encounter for closed fracture with routine healing: Secondary | ICD-10-CM | POA: Diagnosis not present

## 2019-06-09 DIAGNOSIS — E11319 Type 2 diabetes mellitus with unspecified diabetic retinopathy without macular edema: Secondary | ICD-10-CM | POA: Diagnosis not present

## 2019-06-09 DIAGNOSIS — D631 Anemia in chronic kidney disease: Secondary | ICD-10-CM | POA: Diagnosis not present

## 2019-06-09 DIAGNOSIS — N189 Chronic kidney disease, unspecified: Secondary | ICD-10-CM | POA: Diagnosis not present

## 2019-06-09 NOTE — Telephone Encounter (Signed)
Scheduled appts per 5/18 los. Pt confirmed appt date and time.

## 2019-06-09 NOTE — Progress Notes (Signed)
May want to retest HCV just to be complete.

## 2019-06-10 ENCOUNTER — Other Ambulatory Visit (INDEPENDENT_AMBULATORY_CARE_PROVIDER_SITE_OTHER): Payer: Self-pay

## 2019-06-10 DIAGNOSIS — Z794 Long term (current) use of insulin: Secondary | ICD-10-CM

## 2019-06-10 DIAGNOSIS — F3289 Other specified depressive episodes: Secondary | ICD-10-CM

## 2019-06-10 DIAGNOSIS — E1121 Type 2 diabetes mellitus with diabetic nephropathy: Secondary | ICD-10-CM

## 2019-06-10 DIAGNOSIS — E1159 Type 2 diabetes mellitus with other circulatory complications: Secondary | ICD-10-CM

## 2019-06-10 DIAGNOSIS — I152 Hypertension secondary to endocrine disorders: Secondary | ICD-10-CM

## 2019-06-10 DIAGNOSIS — E119 Type 2 diabetes mellitus without complications: Secondary | ICD-10-CM

## 2019-06-10 MED ORDER — LOSARTAN POTASSIUM 100 MG PO TABS: 100.0000 mg | ORAL_TABLET | Freq: Every day | ORAL | 0 refills | Status: DC

## 2019-06-10 MED ORDER — LANTUS SOLOSTAR 100 UNIT/ML ~~LOC~~ SOPN: 12.0000 [IU] | PEN_INJECTOR | Freq: Every day | SUBCUTANEOUS | 0 refills | Status: DC

## 2019-06-10 MED ORDER — TOPIRAMATE 25 MG PO TABS: 25.0000 mg | ORAL_TABLET | Freq: Every day | ORAL | 0 refills | Status: DC

## 2019-06-10 MED ORDER — BUPROPION HCL ER (SR) 200 MG PO TB12: ORAL_TABLET | ORAL | 0 refills | Status: DC

## 2019-06-10 MED ORDER — ACCU-CHEK SOFTCLIX LANCETS MISC: 1.0000 | Freq: Four times a day (QID) | 1 refills | Status: DC

## 2019-06-10 MED ORDER — ACCU-CHEK GUIDE VI STRP: 1.0000 | ORAL_STRIP | Freq: Four times a day (QID) | 1 refills | Status: DC

## 2019-06-10 MED ORDER — VORTIOXETINE HBR 20 MG PO TABS: 20.0000 mg | ORAL_TABLET | Freq: Every day | ORAL | 0 refills | Status: DC

## 2019-06-10 MED ORDER — SURE COMFORT PEN NEEDLES 32G X 4 MM MISC: 0 refills | Status: DC

## 2019-06-11 ENCOUNTER — Telehealth (HOSPITAL_COMMUNITY): Payer: Self-pay

## 2019-06-11 DIAGNOSIS — R7989 Other specified abnormal findings of blood chemistry: Secondary | ICD-10-CM

## 2019-06-11 NOTE — Telephone Encounter (Signed)
Patient advised and verbalized understanding. Orders placed,patient will have labs rechecked at nxt appt 6/10. Med list updated to reflect changes

## 2019-06-11 NOTE — Telephone Encounter (Signed)
-----   Message from Larey Dresser, MD sent at 06/09/2019  2:43 PM EDT ----- LFTs higher.  Stop Crestor.  Repeat LFTs in 2 wks.  Get RUQ [name] to rule out liver/gallbladder pathology.  If LFTs better off statin, will need to go back to lipid clinic for evaluation for Repatha.

## 2019-06-14 ENCOUNTER — Ambulatory Visit (INDEPENDENT_AMBULATORY_CARE_PROVIDER_SITE_OTHER): Payer: Medicare PPO | Admitting: Family Medicine

## 2019-06-14 DIAGNOSIS — S52572D Other intraarticular fracture of lower end of left radius, subsequent encounter for closed fracture with routine healing: Secondary | ICD-10-CM | POA: Diagnosis not present

## 2019-06-14 DIAGNOSIS — M21372 Foot drop, left foot: Secondary | ICD-10-CM | POA: Diagnosis not present

## 2019-06-14 DIAGNOSIS — S52571D Other intraarticular fracture of lower end of right radius, subsequent encounter for closed fracture with routine healing: Secondary | ICD-10-CM | POA: Diagnosis not present

## 2019-06-14 DIAGNOSIS — D631 Anemia in chronic kidney disease: Secondary | ICD-10-CM | POA: Diagnosis not present

## 2019-06-14 DIAGNOSIS — E11319 Type 2 diabetes mellitus with unspecified diabetic retinopathy without macular edema: Secondary | ICD-10-CM | POA: Diagnosis not present

## 2019-06-14 DIAGNOSIS — I129 Hypertensive chronic kidney disease with stage 1 through stage 4 chronic kidney disease, or unspecified chronic kidney disease: Secondary | ICD-10-CM | POA: Diagnosis not present

## 2019-06-14 DIAGNOSIS — E1142 Type 2 diabetes mellitus with diabetic polyneuropathy: Secondary | ICD-10-CM | POA: Diagnosis not present

## 2019-06-14 DIAGNOSIS — N189 Chronic kidney disease, unspecified: Secondary | ICD-10-CM | POA: Diagnosis not present

## 2019-06-14 DIAGNOSIS — E1122 Type 2 diabetes mellitus with diabetic chronic kidney disease: Secondary | ICD-10-CM | POA: Diagnosis not present

## 2019-06-16 DIAGNOSIS — E1122 Type 2 diabetes mellitus with diabetic chronic kidney disease: Secondary | ICD-10-CM | POA: Diagnosis not present

## 2019-06-16 DIAGNOSIS — I129 Hypertensive chronic kidney disease with stage 1 through stage 4 chronic kidney disease, or unspecified chronic kidney disease: Secondary | ICD-10-CM | POA: Diagnosis not present

## 2019-06-16 DIAGNOSIS — S52572D Other intraarticular fracture of lower end of left radius, subsequent encounter for closed fracture with routine healing: Secondary | ICD-10-CM | POA: Diagnosis not present

## 2019-06-16 DIAGNOSIS — E1142 Type 2 diabetes mellitus with diabetic polyneuropathy: Secondary | ICD-10-CM | POA: Diagnosis not present

## 2019-06-16 DIAGNOSIS — S52571D Other intraarticular fracture of lower end of right radius, subsequent encounter for closed fracture with routine healing: Secondary | ICD-10-CM | POA: Diagnosis not present

## 2019-06-16 DIAGNOSIS — D631 Anemia in chronic kidney disease: Secondary | ICD-10-CM | POA: Diagnosis not present

## 2019-06-16 DIAGNOSIS — E11319 Type 2 diabetes mellitus with unspecified diabetic retinopathy without macular edema: Secondary | ICD-10-CM | POA: Diagnosis not present

## 2019-06-16 DIAGNOSIS — N189 Chronic kidney disease, unspecified: Secondary | ICD-10-CM | POA: Diagnosis not present

## 2019-06-16 DIAGNOSIS — M21372 Foot drop, left foot: Secondary | ICD-10-CM | POA: Diagnosis not present

## 2019-06-18 DIAGNOSIS — S52501D Unspecified fracture of the lower end of right radius, subsequent encounter for closed fracture with routine healing: Secondary | ICD-10-CM | POA: Diagnosis not present

## 2019-06-18 DIAGNOSIS — S52502D Unspecified fracture of the lower end of left radius, subsequent encounter for closed fracture with routine healing: Secondary | ICD-10-CM | POA: Diagnosis not present

## 2019-06-22 DIAGNOSIS — M21372 Foot drop, left foot: Secondary | ICD-10-CM | POA: Diagnosis not present

## 2019-06-22 DIAGNOSIS — S52572D Other intraarticular fracture of lower end of left radius, subsequent encounter for closed fracture with routine healing: Secondary | ICD-10-CM | POA: Diagnosis not present

## 2019-06-22 DIAGNOSIS — I129 Hypertensive chronic kidney disease with stage 1 through stage 4 chronic kidney disease, or unspecified chronic kidney disease: Secondary | ICD-10-CM | POA: Diagnosis not present

## 2019-06-22 DIAGNOSIS — E1142 Type 2 diabetes mellitus with diabetic polyneuropathy: Secondary | ICD-10-CM | POA: Diagnosis not present

## 2019-06-22 DIAGNOSIS — N189 Chronic kidney disease, unspecified: Secondary | ICD-10-CM | POA: Diagnosis not present

## 2019-06-22 DIAGNOSIS — D631 Anemia in chronic kidney disease: Secondary | ICD-10-CM | POA: Diagnosis not present

## 2019-06-22 DIAGNOSIS — S52571D Other intraarticular fracture of lower end of right radius, subsequent encounter for closed fracture with routine healing: Secondary | ICD-10-CM | POA: Diagnosis not present

## 2019-06-22 DIAGNOSIS — E1122 Type 2 diabetes mellitus with diabetic chronic kidney disease: Secondary | ICD-10-CM | POA: Diagnosis not present

## 2019-06-22 DIAGNOSIS — E11319 Type 2 diabetes mellitus with unspecified diabetic retinopathy without macular edema: Secondary | ICD-10-CM | POA: Diagnosis not present

## 2019-06-23 DIAGNOSIS — H43811 Vitreous degeneration, right eye: Secondary | ICD-10-CM | POA: Diagnosis not present

## 2019-06-23 DIAGNOSIS — H3582 Retinal ischemia: Secondary | ICD-10-CM | POA: Diagnosis not present

## 2019-06-23 DIAGNOSIS — E113513 Type 2 diabetes mellitus with proliferative diabetic retinopathy with macular edema, bilateral: Secondary | ICD-10-CM | POA: Diagnosis not present

## 2019-06-23 DIAGNOSIS — H35371 Puckering of macula, right eye: Secondary | ICD-10-CM | POA: Diagnosis not present

## 2019-06-24 DIAGNOSIS — D631 Anemia in chronic kidney disease: Secondary | ICD-10-CM | POA: Diagnosis not present

## 2019-06-24 DIAGNOSIS — E1142 Type 2 diabetes mellitus with diabetic polyneuropathy: Secondary | ICD-10-CM | POA: Diagnosis not present

## 2019-06-24 DIAGNOSIS — S52572D Other intraarticular fracture of lower end of left radius, subsequent encounter for closed fracture with routine healing: Secondary | ICD-10-CM | POA: Diagnosis not present

## 2019-06-24 DIAGNOSIS — E11319 Type 2 diabetes mellitus with unspecified diabetic retinopathy without macular edema: Secondary | ICD-10-CM | POA: Diagnosis not present

## 2019-06-24 DIAGNOSIS — S52571D Other intraarticular fracture of lower end of right radius, subsequent encounter for closed fracture with routine healing: Secondary | ICD-10-CM | POA: Diagnosis not present

## 2019-06-24 DIAGNOSIS — M21372 Foot drop, left foot: Secondary | ICD-10-CM | POA: Diagnosis not present

## 2019-06-24 DIAGNOSIS — E1122 Type 2 diabetes mellitus with diabetic chronic kidney disease: Secondary | ICD-10-CM | POA: Diagnosis not present

## 2019-06-24 DIAGNOSIS — N189 Chronic kidney disease, unspecified: Secondary | ICD-10-CM | POA: Diagnosis not present

## 2019-06-24 DIAGNOSIS — I129 Hypertensive chronic kidney disease with stage 1 through stage 4 chronic kidney disease, or unspecified chronic kidney disease: Secondary | ICD-10-CM | POA: Diagnosis not present

## 2019-06-28 ENCOUNTER — Other Ambulatory Visit: Payer: Self-pay

## 2019-06-28 ENCOUNTER — Ambulatory Visit (HOSPITAL_COMMUNITY)
Admission: RE | Admit: 2019-06-28 | Discharge: 2019-06-28 | Disposition: A | Discharge status: Home / Self Care | Payer: Medicare PPO | Source: Ambulatory Visit | Attending: Cardiology | Admitting: Cardiology

## 2019-06-28 DIAGNOSIS — R748 Abnormal levels of other serum enzymes: Secondary | ICD-10-CM | POA: Diagnosis not present

## 2019-06-28 DIAGNOSIS — K8020 Calculus of gallbladder without cholecystitis without obstruction: Secondary | ICD-10-CM | POA: Diagnosis not present

## 2019-06-28 DIAGNOSIS — R7989 Other specified abnormal findings of blood chemistry: Secondary | ICD-10-CM | POA: Insufficient documentation

## 2019-06-29 ENCOUNTER — Ambulatory Visit (INDEPENDENT_AMBULATORY_CARE_PROVIDER_SITE_OTHER): Payer: Medicare PPO | Admitting: Family Medicine

## 2019-06-29 ENCOUNTER — Encounter (INDEPENDENT_AMBULATORY_CARE_PROVIDER_SITE_OTHER): Payer: Self-pay | Admitting: Family Medicine

## 2019-06-29 VITALS — BP 121/66 | HR 55 | Temp 98.1°F | Ht 69.0 in | Wt 172.0 lb

## 2019-06-29 DIAGNOSIS — D631 Anemia in chronic kidney disease: Secondary | ICD-10-CM | POA: Diagnosis not present

## 2019-06-29 DIAGNOSIS — E1121 Type 2 diabetes mellitus with diabetic nephropathy: Secondary | ICD-10-CM | POA: Diagnosis not present

## 2019-06-29 DIAGNOSIS — F3289 Other specified depressive episodes: Secondary | ICD-10-CM | POA: Diagnosis not present

## 2019-06-29 DIAGNOSIS — E669 Obesity, unspecified: Secondary | ICD-10-CM

## 2019-06-29 DIAGNOSIS — Z794 Long term (current) use of insulin: Secondary | ICD-10-CM

## 2019-06-29 DIAGNOSIS — S52572D Other intraarticular fracture of lower end of left radius, subsequent encounter for closed fracture with routine healing: Secondary | ICD-10-CM | POA: Diagnosis not present

## 2019-06-29 DIAGNOSIS — M21372 Foot drop, left foot: Secondary | ICD-10-CM | POA: Diagnosis not present

## 2019-06-29 DIAGNOSIS — E1122 Type 2 diabetes mellitus with diabetic chronic kidney disease: Secondary | ICD-10-CM | POA: Diagnosis not present

## 2019-06-29 DIAGNOSIS — I10 Essential (primary) hypertension: Secondary | ICD-10-CM

## 2019-06-29 DIAGNOSIS — Z6830 Body mass index (BMI) 30.0-30.9, adult: Secondary | ICD-10-CM | POA: Diagnosis not present

## 2019-06-29 DIAGNOSIS — E11319 Type 2 diabetes mellitus with unspecified diabetic retinopathy without macular edema: Secondary | ICD-10-CM | POA: Diagnosis not present

## 2019-06-29 DIAGNOSIS — I129 Hypertensive chronic kidney disease with stage 1 through stage 4 chronic kidney disease, or unspecified chronic kidney disease: Secondary | ICD-10-CM | POA: Diagnosis not present

## 2019-06-29 DIAGNOSIS — E1159 Type 2 diabetes mellitus with other circulatory complications: Secondary | ICD-10-CM

## 2019-06-29 DIAGNOSIS — E1142 Type 2 diabetes mellitus with diabetic polyneuropathy: Secondary | ICD-10-CM | POA: Diagnosis not present

## 2019-06-29 DIAGNOSIS — N189 Chronic kidney disease, unspecified: Secondary | ICD-10-CM | POA: Diagnosis not present

## 2019-06-29 DIAGNOSIS — S52571D Other intraarticular fracture of lower end of right radius, subsequent encounter for closed fracture with routine healing: Secondary | ICD-10-CM | POA: Diagnosis not present

## 2019-06-29 DIAGNOSIS — I152 Hypertension secondary to endocrine disorders: Secondary | ICD-10-CM

## 2019-06-29 MED ORDER — VORTIOXETINE HBR 20 MG PO TABS: 20.0000 mg | ORAL_TABLET | Freq: Every day | ORAL | 0 refills | Status: DC

## 2019-06-29 MED ORDER — TOPIRAMATE 25 MG PO TABS: 25.0000 mg | ORAL_TABLET | Freq: Every day | ORAL | 0 refills | Status: DC

## 2019-06-29 MED ORDER — BUPROPION HCL ER (SR) 200 MG PO TB12: ORAL_TABLET | ORAL | 0 refills | Status: DC

## 2019-06-29 MED ORDER — LANTUS SOLOSTAR 100 UNIT/ML ~~LOC~~ SOPN: 10.0000 [IU] | PEN_INJECTOR | Freq: Every day | SUBCUTANEOUS | 0 refills | Status: DC

## 2019-06-29 MED ORDER — LOSARTAN POTASSIUM 100 MG PO TABS: 100.0000 mg | ORAL_TABLET | Freq: Every day | ORAL | 0 refills | Status: DC

## 2019-06-29 MED ORDER — VICTOZA 18 MG/3ML ~~LOC~~ SOPN: PEN_INJECTOR | SUBCUTANEOUS | 0 refills | Status: DC

## 2019-06-29 NOTE — Progress Notes (Signed)
Chief Complaint:   OBESITY Darlene [name] is here to discuss her progress with her obesity treatment plan along with follow-up of her obesity related diagnoses. Darlene [name] is on keeping a food journal and adhering to recommended goals of 1600-1800 calories and 85 grams of protein daily and states she is following her eating plan approximately 50% of the time. Darlene [name] states she is doing 0 minutes 0 times per week.  Today's visit was #: 47 Starting weight: 225 lbs Starting date: 08/09/2016 Today's weight: 172 lbs Today's date: 06/30/2019 Total lbs lost to date: 71 Total lbs lost since last in-office visit: 0  Interim History: Darlene [name] is now in her maintenance phase and she has been working on slowly increasing her calories as  She is at risk of too much weight loss. She is doing well with portion control and smart food choices.  Subjective:   1. Type 2 diabetes mellitus with diabetic nephropathy, with long-term current use of insulin ([place]) Darlene [name] is on Lantus 12 units and Victoza, metformin was stopped due to chronic renal insufficiency. She has had some fasting BGs in the 60's and 70's and she has some balance issues both which increases risk of falls.  2. Hypertension associated with type 2 diabetes mellitus ([place]) Darlene [name]'s blood pressure is stable on her medications. She denies lightheadedness, and she is doing well with nutrition.  3. Other depression, with emotional eating Darlene [name] is still struggling with some emotional eating at times especially with health issues, which are worrying her. She is tolerating her medications well, and she denies suicidal ideas or homicidal ideas.  Assessment/Plan:   1. Type 2 diabetes mellitus with diabetic nephropathy, with long-term current use of insulin (HCC) Good blood sugar control is important to decrease the likelihood of diabetic complications such as nephropathy, neuropathy, limb loss, blindness, coronary artery disease, and death.  Intensive lifestyle modification including diet, exercise and weight loss are the first line of treatment for diabetes. Darlene [name] agreed to decrease Lantus to 10 units daily and she will continue Victoza, and we will refill both with a 90 day supply with no refills.  - insulin glargine (LANTUS SOLOSTAR) 100 UNIT/ML Solostar Pen; Inject 10 Units into the skin daily.  Dispense: 9 mL; Refill: 0 - liraglutide (VICTOZA) 18 MG/3ML SOPN; INJECT 1.8MG  (0.3ML) SUBCUTANEOUSLY EACH AM  Dispense: 9 mL; Refill: 0  2. Hypertension associated with type 2 diabetes mellitus ([place]) Darlene [name] is working on healthy weight loss and exercise to improve blood pressure control. We will watch for signs of hypotension as she continues her lifestyle modifications. We will refill Cozaar with a 90 day supply with no refills.  - losartan (COZAAR) 100 MG tablet; Take 1 tablet (100 mg total) by mouth daily.  Dispense: 90 tablet; Refill: 0  3. Other depression, with emotional eating Behavior modification techniques were discussed today to help Darlene [name] deal with her emotional/non-hunger eating behaviors. We will refill Wellbutrin SR, Topamax, and Trintellix with a 90 day supply with no refills. Orders and follow up as documented in patient record.   - buPROPion (WELLBUTRIN SR) 200 MG 12 hr tablet; TAKE 1 TABLET AT 12 NOON.  Dispense: 90 tablet; Refill: 0 - topiramate (TOPAMAX) 25 MG tablet; Take 1 tablet (25 mg total) by mouth daily.  Dispense: 90 tablet; Refill: 0 - vortioxetine HBr (TRINTELLIX) 20 MG TABS tablet; Take 1 tablet (20 mg total) by mouth at bedtime.  Dispense: 90 tablet; Refill: 0  4. Class 1 obesity with serious comorbidity and body  mass index (BMI) of 30.0 to 30.9 in adult, unspecified obesity type Darlene [name] is currently in the action stage of change. As such, her goal is to maintain weight for now. She has agreed to keeping a food journal and adhering to recommended goals of 1600-1800 calories and 85+ grams of protein  daily.   Behavioral modification strategies: better snacking choices.  Darlene [name] has agreed to follow-up with our clinic in 3 months. She was informed of the importance of frequent follow-up visits to maximize her success with intensive lifestyle modifications for her multiple health conditions.   Objective:   Blood pressure 121/66, pulse (!) 55, temperature 98.1 F (36.7 C), temperature source Oral, height 5\' 9"  (1.753 m), weight 172 lb (78 kg), last menstrual period 10/22/2007, SpO2 97 %. Body mass index is 25.4 kg/m.  General: Cooperative, alert, well developed, in no acute distress. HEENT: Conjunctivae and lids unremarkable. Cardiovascular: Regular rhythm.  Lungs: Normal work of breathing. Neurologic: No focal deficits.   Lab Results  Component Value Date   CREATININE 1.57 (H) 06/08/2019   BUN 30 (H) 06/08/2019   NA 142 06/08/2019   K 4.4 06/08/2019   CL 112 (H) 06/08/2019   CO2 22 06/08/2019   Lab Results  Component Value Date   ALT 245 (H) 06/08/2019   AST 125 (H) 06/08/2019   ALKPHOS 156 (H) 06/08/2019   BILITOT 0.6 06/08/2019   Lab Results  Component Value Date   HGBA1C 5.6 05/11/2019   HGBA1C 5.5 01/25/2019   HGBA1C 5.4 08/27/2018   HGBA1C 5.8 (H) 09/03/2017   HGBA1C 6.1 (H) 03/11/2017   Lab Results  Component Value Date   INSULIN 7.6 01/25/2019   INSULIN 15.3 03/11/2017   INSULIN 18.3 08/09/2016   Lab Results  Component Value Date   TSH 1.54 02/07/2017   Lab Results  Component Value Date   CHOL 109 05/17/2019   HDL 49 05/17/2019   LDLCALC 42 05/17/2019   TRIG 89 05/17/2019   CHOLHDL 2.2 05/17/2019   Lab Results  Component Value Date   WBC 5.6 06/08/2019   HGB 10.4 (L) 06/08/2019   HCT 31.4 (L) 06/08/2019   MCV 96.3 06/08/2019   PLT 152 06/08/2019   Lab Results  Component Value Date   IRON 58 02/18/2013   TIBC 305 02/18/2013   FERRITIN 133 10/31/2014    Obesity Behavioral Intervention Documentation for Insurance:   Approximately 15  minutes were spent on the discussion below.  ASK: We discussed the diagnosis of obesity with Darlene [name] today and Darlene [name] agreed to give [name] permission to discuss obesity behavioral modification therapy today.  ASSESS: Darlene [name] has the diagnosis of obesity and her BMI today is 25.39. Darlene [name] is in the action stage of change.   ADVISE: Darlene [name] was educated on the multiple health risks of obesity as well as the benefit of weight loss to improve her health. She was advised of the need for long term treatment and the importance of lifestyle modifications to improve her current health and to decrease her risk of future health problems.  AGREE: Multiple dietary modification options and treatment options were discussed and Jaila agreed to follow the recommendations documented in the above note.  ARRANGE: Darlene [name] was educated on the importance of frequent visits to treat obesity as outlined per CMS and USPSTF guidelines and agreed to schedule her next follow up appointment today.  Attestation Statements:   Reviewed by clinician on day of visit: allergies, medications, problem list, medical history, surgical history, family history,  social history, and previous encounter notes.   I, Darlene [name], am acting as transcriptionist for Dennard Nip, MD.  I have reviewed the above documentation for accuracy and completeness, and I agree with the above. -  Dennard Nip, MD

## 2019-06-30 DIAGNOSIS — D631 Anemia in chronic kidney disease: Secondary | ICD-10-CM | POA: Diagnosis not present

## 2019-06-30 DIAGNOSIS — E11319 Type 2 diabetes mellitus with unspecified diabetic retinopathy without macular edema: Secondary | ICD-10-CM | POA: Diagnosis not present

## 2019-06-30 DIAGNOSIS — M21372 Foot drop, left foot: Secondary | ICD-10-CM | POA: Diagnosis not present

## 2019-06-30 DIAGNOSIS — I129 Hypertensive chronic kidney disease with stage 1 through stage 4 chronic kidney disease, or unspecified chronic kidney disease: Secondary | ICD-10-CM | POA: Diagnosis not present

## 2019-06-30 DIAGNOSIS — S52571D Other intraarticular fracture of lower end of right radius, subsequent encounter for closed fracture with routine healing: Secondary | ICD-10-CM | POA: Diagnosis not present

## 2019-06-30 DIAGNOSIS — S52572D Other intraarticular fracture of lower end of left radius, subsequent encounter for closed fracture with routine healing: Secondary | ICD-10-CM | POA: Diagnosis not present

## 2019-06-30 DIAGNOSIS — E1122 Type 2 diabetes mellitus with diabetic chronic kidney disease: Secondary | ICD-10-CM | POA: Diagnosis not present

## 2019-06-30 DIAGNOSIS — N189 Chronic kidney disease, unspecified: Secondary | ICD-10-CM | POA: Diagnosis not present

## 2019-06-30 DIAGNOSIS — E1142 Type 2 diabetes mellitus with diabetic polyneuropathy: Secondary | ICD-10-CM | POA: Diagnosis not present

## 2019-07-01 ENCOUNTER — Ambulatory Visit (HOSPITAL_BASED_OUTPATIENT_CLINIC_OR_DEPARTMENT_OTHER)
Admission: RE | Admit: 2019-07-01 | Discharge: 2019-07-01 | Disposition: A | Discharge status: Home / Self Care | Payer: Medicare PPO | Source: Ambulatory Visit | Attending: Cardiology | Admitting: Cardiology

## 2019-07-01 ENCOUNTER — Encounter (HOSPITAL_COMMUNITY): Payer: Self-pay | Admitting: Cardiology

## 2019-07-01 ENCOUNTER — Ambulatory Visit (HOSPITAL_COMMUNITY)
Admission: RE | Admit: 2019-07-01 | Discharge: 2019-07-01 | Disposition: A | Discharge status: Home / Self Care | Payer: Medicare PPO | Source: Ambulatory Visit | Attending: Cardiology | Admitting: Cardiology

## 2019-07-01 ENCOUNTER — Other Ambulatory Visit: Payer: Self-pay

## 2019-07-01 VITALS — BP 124/68 | HR 59 | Ht 69.0 in | Wt 176.0 lb

## 2019-07-01 DIAGNOSIS — I700 Atherosclerosis of aorta: Secondary | ICD-10-CM

## 2019-07-01 DIAGNOSIS — I2510 Atherosclerotic heart disease of native coronary artery without angina pectoris: Secondary | ICD-10-CM | POA: Insufficient documentation

## 2019-07-01 DIAGNOSIS — Z9221 Personal history of antineoplastic chemotherapy: Secondary | ICD-10-CM | POA: Insufficient documentation

## 2019-07-01 DIAGNOSIS — C50911 Malignant neoplasm of unspecified site of right female breast: Secondary | ICD-10-CM | POA: Insufficient documentation

## 2019-07-01 DIAGNOSIS — I130 Hypertensive heart and chronic kidney disease with heart failure and stage 1 through stage 4 chronic kidney disease, or unspecified chronic kidney disease: Secondary | ICD-10-CM | POA: Diagnosis not present

## 2019-07-01 DIAGNOSIS — Z923 Personal history of irradiation: Secondary | ICD-10-CM | POA: Diagnosis not present

## 2019-07-01 DIAGNOSIS — Z803 Family history of malignant neoplasm of breast: Secondary | ICD-10-CM | POA: Diagnosis not present

## 2019-07-01 DIAGNOSIS — E785 Hyperlipidemia, unspecified: Secondary | ICD-10-CM | POA: Insufficient documentation

## 2019-07-01 DIAGNOSIS — Z794 Long term (current) use of insulin: Secondary | ICD-10-CM | POA: Insufficient documentation

## 2019-07-01 DIAGNOSIS — I509 Heart failure, unspecified: Secondary | ICD-10-CM | POA: Diagnosis not present

## 2019-07-01 DIAGNOSIS — Z886 Allergy status to analgesic agent status: Secondary | ICD-10-CM | POA: Diagnosis not present

## 2019-07-01 DIAGNOSIS — I351 Nonrheumatic aortic (valve) insufficiency: Secondary | ICD-10-CM | POA: Diagnosis not present

## 2019-07-01 DIAGNOSIS — Z833 Family history of diabetes mellitus: Secondary | ICD-10-CM | POA: Insufficient documentation

## 2019-07-01 DIAGNOSIS — Z8249 Family history of ischemic heart disease and other diseases of the circulatory system: Secondary | ICD-10-CM | POA: Insufficient documentation

## 2019-07-01 DIAGNOSIS — Z7982 Long term (current) use of aspirin: Secondary | ICD-10-CM | POA: Diagnosis not present

## 2019-07-01 DIAGNOSIS — Z79899 Other long term (current) drug therapy: Secondary | ICD-10-CM | POA: Insufficient documentation

## 2019-07-01 DIAGNOSIS — G4733 Obstructive sleep apnea (adult) (pediatric): Secondary | ICD-10-CM | POA: Diagnosis not present

## 2019-07-01 DIAGNOSIS — F419 Anxiety disorder, unspecified: Secondary | ICD-10-CM | POA: Insufficient documentation

## 2019-07-01 DIAGNOSIS — N189 Chronic kidney disease, unspecified: Secondary | ICD-10-CM | POA: Insufficient documentation

## 2019-07-01 DIAGNOSIS — E1122 Type 2 diabetes mellitus with diabetic chronic kidney disease: Secondary | ICD-10-CM | POA: Insufficient documentation

## 2019-07-01 DIAGNOSIS — E1140 Type 2 diabetes mellitus with diabetic neuropathy, unspecified: Secondary | ICD-10-CM | POA: Insufficient documentation

## 2019-07-01 DIAGNOSIS — E11319 Type 2 diabetes mellitus with unspecified diabetic retinopathy without macular edema: Secondary | ICD-10-CM | POA: Diagnosis not present

## 2019-07-01 DIAGNOSIS — E669 Obesity, unspecified: Secondary | ICD-10-CM | POA: Insufficient documentation

## 2019-07-01 DIAGNOSIS — F329 Major depressive disorder, single episode, unspecified: Secondary | ICD-10-CM | POA: Diagnosis not present

## 2019-07-01 DIAGNOSIS — M2011 Hallux valgus (acquired), right foot: Secondary | ICD-10-CM | POA: Diagnosis not present

## 2019-07-01 LAB — COMPREHENSIVE METABOLIC PANEL
ALT: 109 U/L — ABNORMAL HIGH (ref 0–44)
AST: 61 U/L — ABNORMAL HIGH (ref 15–41)
Albumin: 3.8 g/dL (ref 3.5–5.0)
Alkaline Phosphatase: 130 U/L — ABNORMAL HIGH (ref 38–126)
Anion gap: 10 (ref 5–15)
BUN: 31 mg/dL — ABNORMAL HIGH (ref 8–23)
CO2: 20 mmol/L — ABNORMAL LOW (ref 22–32)
Calcium: 9.2 mg/dL (ref 8.9–10.3)
Chloride: 112 mmol/L — ABNORMAL HIGH (ref 98–111)
Creatinine, Ser: 1.58 mg/dL — ABNORMAL HIGH (ref 0.44–1.00)
GFR calc Af Amer: 39 mL/min — ABNORMAL LOW (ref >60)
GFR calc non Af Amer: 34 mL/min — ABNORMAL LOW (ref >60)
Glucose, Bld: 89 mg/dL (ref 70–99)
Potassium: 4.4 mmol/L (ref 3.5–5.1)
Sodium: 142 mmol/L (ref 135–145)
Total Bilirubin: 1 mg/dL (ref 0.3–1.2)
Total Protein: 6.4 g/dL — ABNORMAL LOW (ref 6.5–8.1)

## 2019-07-01 NOTE — Addendum Note (Signed)
Encounter addended by: Larey Dresser, MD on: 07/01/2019 11:08 PM  Actions taken: Clinical Note Signed

## 2019-07-01 NOTE — Progress Notes (Signed)
  Echocardiogram 2D Echocardiogram has been performed.  Jennette Dubin 07/01/2019, 9:59 AM

## 2019-07-01 NOTE — Patient Instructions (Signed)
Labs done today, your results will be available in MyChart, we will contact you for abnormal readings.  You have been referred to the [facility], they will call you for an appointment  Please call our office in October to schedule your follow up echocardiogram and appointment  If you have any questions or concerns before your next appointment please send [name] a message through [facility] or call our office at [phone].    TO LEAVE A MESSAGE FOR THE NURSE SELECT OPTION 2, PLEASE LEAVE A MESSAGE INCLUDING: . YOUR NAME . DATE OF BIRTH . CALL BACK NUMBER . REASON FOR CALL**this is important as we prioritize the call backs  YOU WILL RECEIVE A CALL BACK THE SAME DAY AS LONG AS YOU CALL BEFORE 4:00 PM

## 2019-07-01 NOTE — Progress Notes (Addendum)
Patient ID: Darlene [name], female   DOB: Nov 10, 1950, 69 y.o.   MRN: [id number] Oncologist: Dr Jana Hakim   HPI: Darlene [name] is a 69 y.o.with history of R breast cancer, right axillary lymph node biopsy 07/19/2014 for a cT2 pN1, stage IIB invasive ductal carcinoma, grade 2, estrogen and progesterone receptor negative, with an MIB-1 of 90%, and HER-2 positive , OSA -> CPAP, bariatric surgery, and DM, referred to cardio-oncology clinic by Dr Jana Hakim  Completed neoadjuvant chemotherapy with carboplatin, docetaxel, trastuzumab and pertuzumab given every 3 weeks 6. Trastuzumab + capecitabine were completed in 9/17.  She had lumpectomy 12/16 and had mastectomy in 1/17.  She has completed radiation.   She was found in 11/17 to have a recurrence by PET => right retropectoral lymph node enlarged. In 2/18, she started on treatment with ado-trastuzumab emtansine, now back on Herceptin.  Echo prior to treatment showed stable EF.  Starting in 6/19, she was transitioned to [facility].   She is now off Kadcyla and back on Herceptin/Perjeta for use long-term.     Echo in 2/21 showed EF 55-60% with more negative strain.   Coronary artery calcium score was done in 2/21, this showed CAC 3653 Agatston units, placing her in the 99th percentile. She was started on a Crestor but LFTs rose.  She had RUQ [name] without significant abnormality.  Hepatitis labs were negative.  The statin was stopped.  Cardiolite in 3/21 showed EF 51%, no ischemia/infarction.   Echo was done today and reviewed, EF 55-60% with GLS -17.6%.   She comes in today for followup of CAD and chemotherapy with Herceptin.  Symptomatically doing well.  No chest pain, no exertional dyspnea. No lightheadedness.  She will continue Herceptin and Perjeta monthly.    Labs (1/21): LDL 75 Labs (4/21): LDL 42, AST 70 => 125, ALT 117 => 255  Echo (7/16) with EF 60-65%, grade II diastolic dysfunction.  Echo (10/16) with EF 66%, GLS -17%. Echo (1/17) with EF  60-65%, grade II diastolic dysfunction, lateral s' 10, GLS -18.8% Echo (4/17) with EF 60-65%, grade II diastolic dysfunction, lateral s' 9.5, strain poorly traced and off axis, normal RV size and systolic function.  Echo (6/17) with EF 55-60%, aortic sclerosis without stenosis, difficult images for strain, probably not accurate.  Echo (10/17) with EF 55-60%, mild LVH, moderate diastolic dysfunction, normal RV size and systolic function.  Echo (2/18) with EF 55-60%, GLS -15% but technically difficult study Echo (6/18) with EF 60-65%, GLS -15% but technically difficult study.  Echo (9/18) with EF 55-60%, GLS -19.8%, grade II diastolic dysfunction.  Echo (1/19) with EF 55-60%, normal RV size and systolic function, GLS -06.0% (done with different machine).  Echo (4/19) with EF 55-60%, mild LVH, GLS -17% Echo (9/19) with EF 55-60%, GLS -17.1%, normal RV size and systolic function.  Echo (8/20): EF 50-55%.  Echo (11/20): EF 50-55%, GLS -12.7% (not ideal tracing), mildly decreased RV systolic function.   Echo (2/21): EF 55-60%, GLS -17.9%, grade II diastolic dysfunction, normal RV Echo (6/21): EF 55-60% with GLS -17.6%  Review of Systems: All systems reviewed and negative except as per HPI.   Past Medical History:  Diagnosis Date  . Anemia    hx of - during 1st round chemo  . Anxiety   . Arthritis    in fingers, shoulders  . Back pain   . Balance problem   . Breast cancer ([place])   . Breast cancer of upper-outer quadrant of right female breast ([place])  07/22/2014  . Bunion, right foot   . Chronic kidney disease    creatininei levels high per pt   . Clumsiness   . Constipation   . Depression   . Diabetes mellitus without complication ([place])    16+ years type 2   . Diabetic retinopathy ([place])    torn retina  . Eating disorder    binge eating  . Epistaxis 08/26/2014  . Fatty liver   . HCAP (healthcare-associated pneumonia) 12/18/2014  . Heart murmur    never had any problems  . History of  radiation therapy 04/05/15-05/19/15   right chest wall was treated to 50.4 Gy in 28 fractions, right mastectomy scar was treated to 10 Gy in 5 fractions  . Hyperlipidemia   . Hypertension   . Joint pain   . Multiple food allergies   . Neuromuscular disorder ([place])    diabetic neuropathy  . Obesity   . Obstructive sleep apnea on CPAP    does not use cpap all the time has lost 160 lbs   . Ovarian cyst rupture    possible  . Pneumonia   . Pneumonia 11/2014  . Radiation 04/05/15-05/19/15   right chest wall 50.4 Gy, mastectomy scar 10 Gy  . Sleep apnea   . Thyroid nodule 06/2014    Current Outpatient Medications  Medication Sig Dispense Refill  . Accu-Chek Softclix Lancets lancets 1 each by Other route 4 (four) times daily. Test blood sugars 4 x times daily 100 each 1  . aspirin EC 81 MG tablet Take 1 tablet (81 mg total) by mouth daily. 90 tablet 3  . Blood Glucose Calibration (ACCU-CHEK GUIDE CONTROL) LIQD 1 Bottle by In Vitro route as needed. 1 each 0  . Blood Glucose Monitoring Suppl (ACCU-CHEK GUIDE) w/Device KIT 1 each by Does not apply route 4 (four) times daily. Use to test blood sugars 4x a day 1 kit 0  . buPROPion (WELLBUTRIN SR) 200 MG 12 hr tablet TAKE 1 TABLET AT 12 NOON. 90 tablet 0  . CALCIUM CITRATE-VITAMIN D PO Take 1 tablet by mouth 3 (three) times daily.     . carvedilol (COREG) 3.125 MG tablet TAKE 1 TABLET BY MOUTH TWICE DAILY. 180 tablet 3  . cholecalciferol (VITAMIN D) 1000 units tablet Take 1,000 Units by mouth daily.    . cholestyramine (QUESTRAN) 4 g packet Take 1 packet (4 g total) by mouth 2 (two) times daily as needed. 10 each 12  . Cyanocobalamin (VITAMIN B-12 PO) Take 1 tablet by mouth every 14 (fourteen) days.    . diphenhydrAMINE (BENADRYL) 25 MG tablet Take 25 mg by mouth daily as needed for allergies or sleep.     [name] glucose blood (ACCU-CHEK GUIDE) test strip 1 each by Other route 4 (four) times daily. Accu Chek Guide  Test blood sugars 4 times a day 100 each  1  . insulin glargine (LANTUS SOLOSTAR) 100 UNIT/ML Solostar Pen Inject 10 Units into the skin daily. 9 mL 0  . Insulin Pen Needle (SURE COMFORT PEN NEEDLES) 32G X 4 MM MISC USE WITH INJECTIONS 100 each 0  . liraglutide (VICTOZA) 18 MG/3ML SOPN INJECT 1.8MG (0.3ML) SUBCUTANEOUSLY EACH AM 9 mL 0  . loratadine (CLARITIN) 10 MG tablet Take 10 mg by mouth as needed for allergies.    [name] losartan (COZAAR) 100 MG tablet Take 1 tablet (100 mg total) by mouth daily. 90 tablet 0  . ondansetron (ZOFRAN) 4 MG tablet Take 1 tablet (4 mg total)  by mouth every 8 (eight) hours as needed for nausea or vomiting. 20 tablet 0  . Pediatric Multivitamins-Iron (FLINTSTONES PLUS IRON) chewable tablet Chew 1 tablet by mouth 2 (two) times daily.    . Probiotic Product ([employer] PROBIOTIC PO) Take 1 capsule by mouth daily.     . prochlorperazine (COMPAZINE) 10 MG tablet Take 1 tablet (10 mg total) by mouth every 6 (six) hours as needed for nausea or vomiting. 30 tablet 0  . Propylene Glycol (SYSTANE BALANCE OP) Place 1-2 drops into both eyes 3 (three) times daily as needed (for dry eyes).     [name] senna-docusate (SENNA PLUS) 8.6-50 MG tablet Take 1 tablet by mouth daily as needed for moderate constipation.     . topiramate (TOPAMAX) 25 MG tablet Take 1 tablet (25 mg total) by mouth daily. 90 tablet 0  . vortioxetine HBr (TRINTELLIX) 20 MG TABS tablet Take 1 tablet (20 mg total) by mouth at bedtime. 90 tablet 0   No current facility-administered medications for this encounter.    Allergies  Allergen Reactions  . Nsaids Other (See Comments)    H/o gastric bypass - avoid NSAIDs  . Tape Hives, Rash and Other (See Comments)    Adhesives in bandaids      Social History   Socioeconomic History  . Marital status: Married    Spouse name: [name]  . Number of children: 0  . Years of education: Bachelor's  . Highest education level: Not on file  Occupational History  . Occupation: Pensions consultant: [employer]  Tobacco Use  . Smoking status: Never Smoker  . Smokeless tobacco: Never Used  Vaping Use  . Vaping Use: Never used  Substance and Sexual Activity  . Alcohol use: Yes    Alcohol/week: 1.0 - 2.0 standard drink    Types: 1 - 2 Standard drinks or equivalent per week    Comment: social  . Drug use: No  . Sexual activity: Yes    Partners: Male    Birth control/protection: Post-menopausal, Other-see comments    Comment: husband vasectomy  Other Topics Concern  . Not on file  Social History Narrative   Patient is married [name]) and lives at home with her husband.   Patient does not have any children.   Patient is working for a [employer].   Patient has a Bachelor's degree.   Patient is right-handed.   Patient does not drink any caffeine.   Social Determinants of Health   Financial Resource Strain:   . Difficulty of Paying Living Expenses:   Food Insecurity:   . Worried About Charity fundraiser in the Last Year:   . Arboriculturist in the Last Year:   Transportation Needs:   . Film/video editor (Medical):   [name] Lack of Transportation (Non-Medical):   Physical Activity:   . Days of Exercise per Week:   . Minutes of Exercise per Session:   Stress:   . Feeling of Stress :   Social Connections:   . Frequency of Communication with Friends and Family:   . Frequency of Social Gatherings with Friends and Family:   . Attends Religious Services:   . Active Member of Clubs or Organizations:   . Attends Archivist Meetings:   [name] Marital Status:   Intimate Partner Violence:   . Fear of Current or Ex-Partner:   . Emotionally Abused:   [name] Physically Abused:   . Sexually Abused:  Family History  Problem Relation Age of Onset  . CVA Mother   . Heart disease Mother   . Sudden death Mother   . Diabetes Father   . Heart failure Father   . Hypertension Father   . Kidney disease Father   . Obesity Father   . Hypertension Sister   . Diabetes Brother    . Breast cancer Paternal Grandmother 45  . Diabetes Paternal Uncle   . Ovarian cancer Neg Hx     Vitals:   07/01/19 1009  BP: 124/68  Pulse: (!) 59  SpO2: 100%  Weight: 79.8 kg (176 lb)  Height: '5\' 9"'  (1.753 m)    PHYSICAL EXAM: General: NAD Neck: No JVD, no thyromegaly or thyroid nodule.  Lungs: Clear to auscultation bilaterally with normal respiratory effort. CV: Nondisplaced PMI.  Heart regular S1/S2, no S3/S4, no murmur.  No peripheral edema.  No carotid bruit.  Normal pedal pulses.  Abdomen: Soft, nontender, no hepatosplenomegaly, no distention.  Skin: Intact without lesions or rashes.  Neurologic: Alert and oriented x 3.  Psych: Normal affect. Extremities: No clubbing or cyanosis.  HEENT: Normal.   ASSESSMENT & PLAN:  1. Breast cancer: Metastatic.  Currently getting Herceptin and Perjeta.  Echo in 11/20 showed EF 50-55%.  Echo in 6/21 showed EF stable at 55-60% with stable strain (-17.6%). - Continue losartan 100 mg daily.  - Continue Coreg 3.125 mg bid.   - Repeat echo + followup in 4 months.     2. HTN: BP controlled on losartan + Coreg.  3. OSA: Continue nightly CPAP   4. CAD: Coronary artery calcium score was elevated, 3653 Agatston units, placing her in the 99th percentile for her age and gender.  This suggests high risk for future cardiac events.  She has no ischemic symptoms.  Cardiolite 3/21 with no ischemia/infarction.  - She had a rise in LFTs with Crestor. RUQ [name] negative.  I will repeat LFTs off statin.   I will refer her to lipid clinic to see if we can get Repatha for her.  - Continue ASA 81, ARB, and beta blocker.   Followup in 4 months with echo  Loralie Champagne 07/01/2019

## 2019-07-02 DIAGNOSIS — E1122 Type 2 diabetes mellitus with diabetic chronic kidney disease: Secondary | ICD-10-CM | POA: Diagnosis not present

## 2019-07-02 DIAGNOSIS — M21372 Foot drop, left foot: Secondary | ICD-10-CM | POA: Diagnosis not present

## 2019-07-02 DIAGNOSIS — N189 Chronic kidney disease, unspecified: Secondary | ICD-10-CM | POA: Diagnosis not present

## 2019-07-02 DIAGNOSIS — S52572D Other intraarticular fracture of lower end of left radius, subsequent encounter for closed fracture with routine healing: Secondary | ICD-10-CM | POA: Diagnosis not present

## 2019-07-02 DIAGNOSIS — I129 Hypertensive chronic kidney disease with stage 1 through stage 4 chronic kidney disease, or unspecified chronic kidney disease: Secondary | ICD-10-CM | POA: Diagnosis not present

## 2019-07-02 DIAGNOSIS — E1142 Type 2 diabetes mellitus with diabetic polyneuropathy: Secondary | ICD-10-CM | POA: Diagnosis not present

## 2019-07-02 DIAGNOSIS — D631 Anemia in chronic kidney disease: Secondary | ICD-10-CM | POA: Diagnosis not present

## 2019-07-02 DIAGNOSIS — E11319 Type 2 diabetes mellitus with unspecified diabetic retinopathy without macular edema: Secondary | ICD-10-CM | POA: Diagnosis not present

## 2019-07-02 DIAGNOSIS — S52571D Other intraarticular fracture of lower end of right radius, subsequent encounter for closed fracture with routine healing: Secondary | ICD-10-CM | POA: Diagnosis not present

## 2019-07-05 NOTE — Progress Notes (Signed)
x

## 2019-07-06 ENCOUNTER — Inpatient Hospital Stay: Payer: Medicare PPO

## 2019-07-06 ENCOUNTER — Inpatient Hospital Stay (HOSPITAL_BASED_OUTPATIENT_CLINIC_OR_DEPARTMENT_OTHER): Payer: Medicare PPO | Admitting: Oncology

## 2019-07-06 ENCOUNTER — Other Ambulatory Visit: Payer: Self-pay

## 2019-07-06 ENCOUNTER — Other Ambulatory Visit: Payer: Medicare Other

## 2019-07-06 ENCOUNTER — Ambulatory Visit: Payer: Medicare Other

## 2019-07-06 ENCOUNTER — Inpatient Hospital Stay: Payer: Medicare PPO | Attending: Oncology

## 2019-07-06 ENCOUNTER — Encounter: Payer: Self-pay | Admitting: Oncology

## 2019-07-06 VITALS — BP 123/58 | HR 58 | Temp 98.2°F | Resp 17 | Ht 69.0 in | Wt 176.1 lb

## 2019-07-06 DIAGNOSIS — Z5112 Encounter for antineoplastic immunotherapy: Secondary | ICD-10-CM | POA: Insufficient documentation

## 2019-07-06 DIAGNOSIS — C50411 Malignant neoplasm of upper-outer quadrant of right female breast: Secondary | ICD-10-CM

## 2019-07-06 DIAGNOSIS — Z95828 Presence of other vascular implants and grafts: Secondary | ICD-10-CM

## 2019-07-06 DIAGNOSIS — Z171 Estrogen receptor negative status [ER-]: Secondary | ICD-10-CM | POA: Diagnosis not present

## 2019-07-06 DIAGNOSIS — K760 Fatty (change of) liver, not elsewhere classified: Secondary | ICD-10-CM | POA: Diagnosis not present

## 2019-07-06 DIAGNOSIS — C801 Malignant (primary) neoplasm, unspecified: Secondary | ICD-10-CM

## 2019-07-06 DIAGNOSIS — C50911 Malignant neoplasm of unspecified site of right female breast: Secondary | ICD-10-CM

## 2019-07-06 DIAGNOSIS — C787 Secondary malignant neoplasm of liver and intrahepatic bile duct: Secondary | ICD-10-CM

## 2019-07-06 DIAGNOSIS — E119 Type 2 diabetes mellitus without complications: Secondary | ICD-10-CM | POA: Diagnosis not present

## 2019-07-06 DIAGNOSIS — D630 Anemia in neoplastic disease: Secondary | ICD-10-CM

## 2019-07-06 DIAGNOSIS — E559 Vitamin D deficiency, unspecified: Secondary | ICD-10-CM

## 2019-07-06 LAB — CBC WITH DIFFERENTIAL/PLATELET
Abs Immature Granulocytes: 0.01 10*3/uL (ref 0.00–0.07)
Basophils Absolute: 0 10*3/uL (ref 0.0–0.1)
Basophils Relative: 0 %
Eosinophils Absolute: 0.1 10*3/uL (ref 0.0–0.5)
Eosinophils Relative: 3 %
HCT: 31.1 % — ABNORMAL LOW (ref 36.0–46.0)
Hemoglobin: 10.2 g/dL — ABNORMAL LOW (ref 12.0–15.0)
Immature Granulocytes: 0 %
Lymphocytes Relative: 22 %
Lymphs Abs: 1 10*3/uL (ref 0.7–4.0)
MCH: 31.5 pg (ref 26.0–34.0)
MCHC: 32.8 g/dL (ref 30.0–36.0)
MCV: 96 fL (ref 80.0–100.0)
Monocytes Absolute: 0.3 10*3/uL (ref 0.1–1.0)
Monocytes Relative: 7 %
Neutro Abs: 3 10*3/uL (ref 1.7–7.7)
Neutrophils Relative %: 68 %
Platelets: 148 10*3/uL — ABNORMAL LOW (ref 150–400)
RBC: 3.24 MIL/uL — ABNORMAL LOW (ref 3.87–5.11)
RDW: 13.1 % (ref 11.5–15.5)
WBC: 4.5 10*3/uL (ref 4.0–10.5)
nRBC: 0 % (ref 0.0–0.2)

## 2019-07-06 LAB — COMPREHENSIVE METABOLIC PANEL
ALT: 74 U/L — ABNORMAL HIGH (ref 0–44)
AST: 40 U/L (ref 15–41)
Albumin: 3.5 g/dL (ref 3.5–5.0)
Alkaline Phosphatase: 120 U/L (ref 38–126)
Anion gap: 8 (ref 5–15)
BUN: 33 mg/dL — ABNORMAL HIGH (ref 8–23)
CO2: 21 mmol/L — ABNORMAL LOW (ref 22–32)
Calcium: 8.7 mg/dL — ABNORMAL LOW (ref 8.9–10.3)
Chloride: 115 mmol/L — ABNORMAL HIGH (ref 98–111)
Creatinine, Ser: 1.61 mg/dL — ABNORMAL HIGH (ref 0.44–1.00)
GFR calc Af Amer: 38 mL/min — ABNORMAL LOW (ref >60)
GFR calc non Af Amer: 33 mL/min — ABNORMAL LOW (ref >60)
Glucose, Bld: 122 mg/dL — ABNORMAL HIGH (ref 70–99)
Potassium: 4.5 mmol/L (ref 3.5–5.1)
Sodium: 144 mmol/L (ref 135–145)
Total Bilirubin: 0.6 mg/dL (ref 0.3–1.2)
Total Protein: 6 g/dL — ABNORMAL LOW (ref 6.5–8.1)

## 2019-07-06 MED ORDER — SODIUM CHLORIDE 0.9 % IV SOLN
420.0000 mg | Freq: Once | INTRAVENOUS | Status: AC
  Administered 2019-07-06: 420 mg via INTRAVENOUS
  Filled 2019-07-06: qty 14

## 2019-07-06 MED ORDER — DIPHENHYDRAMINE HCL 25 MG PO CAPS
25.0000 mg | ORAL_CAPSULE | Freq: Once | ORAL | Status: AC
  Administered 2019-07-06: 25 mg via ORAL

## 2019-07-06 MED ORDER — SODIUM CHLORIDE 0.9 % IV SOLN
Freq: Once | INTRAVENOUS | Status: AC
  Filled 2019-07-06: qty 250

## 2019-07-06 MED ORDER — TRASTUZUMAB-ANNS CHEMO 420 MG IV SOLR
450.0000 mg | Freq: Once | INTRAVENOUS | Status: AC
  Administered 2019-07-06: 450 mg via INTRAVENOUS
  Filled 2019-07-06: qty 21.43

## 2019-07-06 MED ORDER — HEPARIN SOD (PORK) LOCK FLUSH 100 UNIT/ML IV SOLN
500.0000 [IU] | Freq: Once | INTRAVENOUS | Status: AC | PRN
  Administered 2019-07-06: 500 [IU]
  Filled 2019-07-06: qty 5

## 2019-07-06 MED ORDER — DIPHENHYDRAMINE HCL 25 MG PO CAPS
ORAL_CAPSULE | ORAL | Status: AC
  Filled 2019-07-06: qty 1

## 2019-07-06 MED ORDER — ACETAMINOPHEN 325 MG PO TABS
ORAL_TABLET | ORAL | Status: AC
  Filled 2019-07-06: qty 2

## 2019-07-06 MED ORDER — SODIUM CHLORIDE 0.9% FLUSH
10.0000 mL | INTRAVENOUS | Status: DC | PRN
  Administered 2019-07-06: 10 mL
  Filled 2019-07-06: qty 10

## 2019-07-06 MED ORDER — ACETAMINOPHEN 325 MG PO TABS
650.0000 mg | ORAL_TABLET | Freq: Once | ORAL | Status: AC
  Administered 2019-07-06: 650 mg via ORAL

## 2019-07-06 MED ORDER — SODIUM CHLORIDE 0.9% FLUSH
10.0000 mL | INTRAVENOUS | Status: DC | PRN
  Administered 2019-07-06: 10 mL via INTRAVENOUS
  Filled 2019-07-06: qty 10

## 2019-07-06 NOTE — Progress Notes (Signed)
[facility]  [phone] [phone]    ID: Darlene [name] DOB: 12/27/1950  MR#: [id number]  CSN#:[id number]  Patient Care Team: Wong, Francis P, MD as PCP - General (Family Medicine) McLean, Dalton S, MD as PCP - Advanced Heart Failure (Cardiology) Byerly, Faera, MD as Consulting Physician (General Surgery) Magrinat, Gustav C, MD as Consulting Physician (Oncology) Balan, Bindubal, MD as Consulting Physician (Endocrinology) Thimmappa, Brinda, MD as Consulting Physician (Plastic Surgery) Kinard, James, MD as Consulting Physician (Radiation Oncology) Mann, Jyothi, MD as Consulting Physician (Gastroenterology) Patel, Donika K, DO as Consulting Physician (Neurology) Beasley, Caren D, MD as Consulting Physician (Family Medicine) Sanders, Jason, MD as Consulting Physician (Ophthalmology)  OTHER MD:   CHIEF COMPLAINT:  HER-2 positive, estrogen receptor negative breast cancer  CURRENT TREATMENT:  Trastuzumab; Pertuzumab   INTERVAL HISTORY:  Darlene [name] returns today for follow-up and treatment of her estrogen receptor negative but HER-2 amplified breast cancer.   She continues on Trastuzumab/Pertuzumab every 4 weeks.  She is tolerating this well, and the plan is to continue indefinitely so long as there is no evidence of disease recurrence.  She underwent repeat echocardiogram on 07/01/2019 showing an ejection fraction of 55-60%. Grade I diastolic dysfunction was noted.  Since her last visit, she underwent abdominal ultrasound on 06/28/2019 showing: cholelithiasis; no liver lesions evident by ultrasound.  REVIEW OF SYSTEMS: Darlene [name] continues to have significant problems with neuropathy.  Some of this of course is going to be due to her prior chemo but most of it is going to be due to her diabetes and this is progressive.  She had a recent fall, where she broke both her arms and is now using a cane.  She has a left foot drop which complicates things.  During this  time [name] who had his own spine surgery and was therefore limited had to do all the housework and cooking.  She has lost some weight and now is trying to maintain.  She is receiving OT and PT at home.   BREAST CANCER HISTORY: From the original intake note:  "Darlene [name]" had screening mammography at her gynecologist suggestive of a possible abnormality in the right breast. However right diagnostic mammogram 04/12/2013 at [facility] was negative. More recently however, she noted a lump in her right breast and brought it to her gynecologist's attention. On 07/18/2014 the patient had bilateral diagnostic mammography with tomosynthesis at [facility]. The breast density was category B. In the right breast upper outer quadrant there was an area of focal asymmetry with indistinct margins.. Ultrasound was obtained and showed in addition to the mass in question, measuring 1.7 cm, an abnormal-appearing lymph node in the right axillary tail.  On 07/19/2014 the patient underwent biopsy of the right breast massin question as well as a right axillary lymph node. Both were positive for invasive ductal carcinoma, grade 2, estrogen and progesterone receptor negative, with an MIB-1 of 90%, and HER-2 pending. Incidentally the lymph node biopsy showed a lymphocytic inflammatory component but no lymph node architecture.  Her subsequent history is as detailed above.   PAST MEDICAL HISTORY: Past Medical History:  Diagnosis Date  . Anemia    hx of - during 1st round chemo  . Anxiety   . Arthritis    in fingers, shoulders  . Back pain   . Balance problem   . Breast cancer (HCC)   . Breast cancer of upper-outer quadrant of right female breast (HCC) 07/22/2014  . Bunion, right foot   .   Chronic kidney disease    creatininei levels high per pt   . Clumsiness   . Constipation   . Depression   . Diabetes mellitus without complication ([place])    19+ years type 2   . Diabetic retinopathy ([place])    torn retina  . Eating disorder     binge eating  . Epistaxis 08/26/2014  . Fatty liver   . HCAP (healthcare-associated pneumonia) 12/18/2014  . Heart murmur    never had any problems  . History of radiation therapy 04/05/15-05/19/15   right chest wall was treated to 50.4 Gy in 28 fractions, right mastectomy scar was treated to 10 Gy in 5 fractions  . Hyperlipidemia   . Hypertension   . Joint pain   . Multiple food allergies   . Neuromuscular disorder ([place])    diabetic neuropathy  . Obesity   . Obstructive sleep apnea on CPAP    does not use cpap all the time has lost 160 lbs   . Ovarian cyst rupture    possible  . Pneumonia   . Pneumonia 11/2014  . Radiation 04/05/15-05/19/15   right chest wall 50.4 Gy, mastectomy scar 10 Gy  . Sleep apnea   . Thyroid nodule 06/2014    PAST SURGICAL HISTORY: Past Surgical History:  Procedure Laterality Date  . APPENDECTOMY    . BIOPSY THYROID Bilateral 06/2014   2 nodules - Benign   . BREAST LUMPECTOMY WITH RADIOACTIVE SEED AND SENTINEL LYMPH NODE BIOPSY Right 12/27/2014   Procedure: BREAST LUMPECTOMY WITH RADIOACTIVE SEED AND SENTINEL LYMPH NODE BIOPSY;  Surgeon: Stark Klein, MD;  Location: [place];  Service: General;  Laterality: Right;  . BREAST REDUCTION SURGERY Bilateral 01/06/2015   Procedure: BILATERAL BREAST REDUCTION, RIGHT ONCOPLASTIC RECONSTRUCTION),LEFT BREAST REDUCTION FOR SYMMETRY;  Surgeon: [name], MD;  Location: [place];  Service: Plastics;  Laterality: Bilateral;  . BREATH TEK H PYLORI N/A 09/15/2012   Procedure: BREATH TEK H PYLORI;  Surgeon: Pedro Earls, MD;  Location: [facility];  Service: General;  Laterality: N/A;  . BUNIONECTOMY Left 12/2013  . CARPAL TUNNEL RELEASE Right   . CARPAL TUNNEL RELEASE Left   . CATARACT EXTRACTION     6/19  . COLONOSCOPY    . DUPUYTREN CONTRACTURE RELEASE    . GASTRIC ROUX-EN-Y N/A 11/02/2012   Procedure: LAPAROSCOPIC ROUX-EN-Y GASTRIC;  Surgeon: Pedro Earls, MD;  Location: [facility];  Service:  General;  Laterality: N/A;  . IR GENERIC HISTORICAL  03/18/2016   IR US GUIDE VASC ACCESS LEFT 03/18/2016 Markus Daft, MD [facility]  . IR GENERIC HISTORICAL  03/18/2016   IR FLUORO GUIDE CV LINE LEFT 03/18/2016 Markus Daft, MD [facility]  . LAPAROSCOPIC APPENDECTOMY N/A 07/22/2015   Procedure: APPENDECTOMY LAPAROSCOPIC;  Surgeon: Michael Boston, MD;  Location: [facility];  Service: General;  Laterality: N/A;  . MASTECTOMY Right 02/15/2015   modified  . MODIFIED MASTECTOMY Right 02/15/2015   Procedure: RIGHT MODIFIED RADICAL MASTECTOMY;  Surgeon: Stark Klein, MD;  Location: [place];  Service: General;  Laterality: Right;  . ORIF WRIST FRACTURE Left 05/11/2019   Procedure: OPEN REDUCTION INTERNAL FIXATION (ORIF) left distal radius fracture;  Surgeon: Renette Butters, MD;  Location: [facility];  Service: Orthopedics;  Laterality: Left;  with block  . PORT-A-CATH REMOVAL Left 10/10/2015   Procedure: PORT REMOVAL;  Surgeon: Stark Klein, MD;  Location: [place];  Service: General;  Laterality: Left;  . PORTACATH PLACEMENT Left 08/02/2014   Procedure:  INSERTION PORT-A-CATH;  Surgeon: Faera Byerly, MD;  Location: [facility];  Service: General;  Laterality: Left;  . PORTACATH PLACEMENT N/A 11/05/2016   Procedure: INSERTION PORT-A-CATH;  Surgeon: Byerly, Faera, MD;  Location: [facility];  Service: General;  Laterality: N/A;  . RETINAL LASER PROCEDURE     retina tear on left eye  . ROTATOR CUFF REPAIR Right 2005  . SCAR REVISION Right 12/08/2015   Procedure: COMPLEX REPAIR RIGHT CHEST 10-15CM;  Surgeon: Brinda Thimmappa, MD;  Location: [facility];  Service: Plastics;  Laterality: Right;  . TOE SURGERY  1970s   bone spur  . TRIGGER FINGER RELEASE     x3    FAMILY HISTORY Family History  Problem Relation Age of Onset  . CVA Mother   . Heart disease Mother   . Sudden death Mother   . Diabetes Father   . Heart failure Father   . Hypertension Father   . Kidney disease Father   .  Obesity Father   . Hypertension Sister   . Diabetes Brother   . Breast cancer Paternal Grandmother 60  . Diabetes Paternal Uncle   . Ovarian cancer Neg Hx    The patient's father died from complications of diabetes at age 66. The patient's mother died at age 77 with a subarachnoid hemorrhage. The patient had one brother, one sister. The only breast cancer was the patient's paternal grandmother diagnosed in her 60s. There is no history of ovarian cancer in the family.   GYNECOLOGIC HISTORY:  Patient's last menstrual period was 10/22/2007.  menarche age 14, the patient is GX P0. She went through the change of life in 2011. She did not take hormone replacement.   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 Her husband [name] is a retired sociology professor (he taught at [employer]).  In May 2020 they moved to [place] in the [place] [employer] area.  [name] has 2 children from an earlier marriage, Kendra Burnett who teaches in [place], and Alisa Backes,  who works at the [employer] in in [place]. He has 1 grand son, aged 18. The patient and her husband are not church attenders.   ADVANCED DIRECTIVES:  Not in place   HEALTH MAINTENANCE: Social History   Tobacco Use  . Smoking status: Never Smoker  . Smokeless tobacco: Never Used  Vaping Use  . Vaping Use: Never used  Substance Use Topics  . Alcohol use: Yes    Alcohol/week: 1.0 - 2.0 standard drink    Types: 1 - 2 Standard drinks or equivalent per week    Comment: social  . Drug use: No     Colonoscopy:  May 2016  PAP:  Bone density: 07/01/2016 at [facility] showed normal, with a T score of -0.7  Lipid panel:  Allergies  Allergen Reactions  . Nsaids Other (See Comments)    H/o gastric bypass - avoid NSAIDs  . Tape Hives, Rash and Other (See Comments)    Adhesives in bandaids     Current Outpatient Medications  Medication Sig Dispense Refill  . Accu-Chek Softclix Lancets lancets 1 each by Other route 4 (four) times daily. Test blood sugars 4 x times daily 100 each 1  . aspirin EC 81 MG tablet Take 1 tablet (81 mg total) by mouth daily. 90 tablet 3  . Blood Glucose Calibration (ACCU-CHEK   GUIDE CONTROL) LIQD 1 Bottle by In Vitro route as needed. 1 each 0  . Blood Glucose Monitoring Suppl (ACCU-CHEK GUIDE) w/Device KIT 1 each by Does not apply route 4 (four) times daily. Use to test blood sugars 4x a day 1 kit 0  . buPROPion (WELLBUTRIN SR) 200 MG 12 hr tablet TAKE 1 TABLET AT 12 NOON. 90 tablet 0  . CALCIUM CITRATE-VITAMIN D PO Take 1 tablet by mouth 3 (three) times daily.     . carvedilol (COREG) 3.125 MG tablet TAKE 1 TABLET BY MOUTH TWICE DAILY. 180 tablet 3  . cholecalciferol (VITAMIN D) 1000 units tablet Take 1,000 Units by mouth daily.    . cholestyramine (QUESTRAN) 4 g packet Take 1 packet (4 g total) by mouth 2 (two) times daily as needed. 10 each 12  . Cyanocobalamin (VITAMIN B-12 PO) Take 1 tablet by mouth every 14 (fourteen) days.    . diphenhydrAMINE (BENADRYL) 25 MG tablet Take 25 mg by mouth daily as needed for allergies or sleep.     [name] glucose blood (ACCU-CHEK GUIDE) test strip 1 each by Other route 4 (four) times daily. Accu Chek Guide  Test blood sugars 4 times a day 100 each 1  . insulin glargine (LANTUS SOLOSTAR) 100 UNIT/ML Solostar Pen Inject 10 Units into the skin daily. 9 mL 0  . Insulin Pen Needle (SURE COMFORT PEN NEEDLES) 32G X 4 MM MISC USE WITH INJECTIONS 100 each 0  . liraglutide (VICTOZA) 18 MG/3ML SOPN INJECT 1.8MG (0.3ML) SUBCUTANEOUSLY EACH AM 9 mL 0  . loratadine (CLARITIN) 10 MG tablet Take 10 mg by mouth as needed for allergies.    [name] losartan (COZAAR) 100 MG tablet Take 1 tablet (100 mg total) by mouth daily. 90 tablet 0  . ondansetron (ZOFRAN) 4 MG tablet Take 1 tablet (4 mg total) by mouth every 8 (eight) hours as needed for nausea  or vomiting. 20 tablet 0  . Pediatric Multivitamins-Iron (FLINTSTONES PLUS IRON) chewable tablet Chew 1 tablet by mouth 2 (two) times daily.    . Probiotic Product (CVS PROBIOTIC PO) Take 1 capsule by mouth daily.     . prochlorperazine (COMPAZINE) 10 MG tablet Take 1 tablet (10 mg total) by mouth every 6 (six) hours as needed for nausea or vomiting. 30 tablet 0  . Propylene Glycol (SYSTANE BALANCE OP) Place 1-2 drops into both eyes 3 (three) times daily as needed (for dry eyes).     [name] senna-docusate (SENNA PLUS) 8.6-50 MG tablet Take 1 tablet by mouth daily as needed for moderate constipation.     . topiramate (TOPAMAX) 25 MG tablet Take 1 tablet (25 mg total) by mouth daily. 90 tablet 0  . vortioxetine HBr (TRINTELLIX) 20 MG TABS tablet Take 1 tablet (20 mg total) by mouth at bedtime. 90 tablet 0   No current facility-administered medications for this visit.   Facility-Administered Medications Ordered in Other Visits  Medication Dose Route Frequency Provider Last Rate Last Admin  . sodium chloride flush (NS) 0.9 % injection 10 mL  10 mL Intracatheter PRN Vivienne Sangiovanni, Virgie Dad, MD   10 mL at 07/06/19 1547    OBJECTIVE: White woman in no acute distress  Vitals:   07/06/19 1216  BP: (!) 123/58  Pulse: (!) 58  Resp: 17  Temp: 98.2 F (36.8 C)  SpO2: 100%     Body mass index is 26.01 kg/m.     ECOG FS:1 - Symptomatic but completely ambulatory   [employer]  07/06/19 1216  Weight: 176 lb 1.6 oz (79.9 kg)     Sclerae unicteric, EOMs intact Wearing a mask No cervical or supraclavicular adenopathy Lungs no rales or rhonchi Heart regular rate and rhythm Abd soft, nontender, positive bowel sounds MSK no focal spinal tenderness, no upper extremity lymphedema Neuro: nonfocal, well oriented, appropriate affect Breasts: Status post right mastectomy, with no evidence of chest wall recurrence.  Left breast benign.  Both axillae are benign   LAB RESULTS:  CMP     Component Value  Date/Time   NA 144 07/06/2019 1210   NA 146 (H) 01/25/2019 0957   NA 140 01/07/2017 0758   K 4.5 07/06/2019 1210   K 4.1 01/07/2017 0758   CL 115 (H) 07/06/2019 1210   CO2 21 (L) 07/06/2019 1210   CO2 24 01/07/2017 0758   GLUCOSE 122 (H) 07/06/2019 1210   GLUCOSE 134 01/07/2017 0758   BUN 33 (H) 07/06/2019 1210   BUN 25 01/25/2019 0957   BUN 33.6 (H) 01/07/2017 0758   CREATININE 1.61 (H) 07/06/2019 1210   CREATININE 1.62 (H) 04/28/2018 0915   CREATININE 1.5 (H) 01/07/2017 0758   CALCIUM 8.7 (L) 07/06/2019 1210   CALCIUM 9.3 01/07/2017 0758   PROT 6.0 (L) 07/06/2019 1210   PROT 6.6 01/25/2019 0957   PROT 6.7 01/07/2017 0758   ALBUMIN 3.5 07/06/2019 1210   ALBUMIN 4.3 01/25/2019 0957   ALBUMIN 3.6 01/07/2017 0758   AST 40 07/06/2019 1210   AST 43 (H) 04/28/2018 0915   AST 51 (H) 01/07/2017 0758   ALT 74 (H) 07/06/2019 1210   ALT 61 (H) 04/28/2018 0915   ALT 82 (H) 01/07/2017 0758   ALKPHOS 120 07/06/2019 1210   ALKPHOS 115 01/07/2017 0758   BILITOT 0.6 07/06/2019 1210   BILITOT 0.7 01/25/2019 0957   BILITOT 0.6 04/28/2018 0915   BILITOT 0.72 01/07/2017 0758   GFRNONAA 33 (L) 07/06/2019 1210   GFRNONAA 33 (L) 04/28/2018 0915   GFRNONAA 55 (L) 02/18/2013 0827   GFRAA 38 (L) 07/06/2019 1210   GFRAA 38 (L) 04/28/2018 0915   GFRAA 63 02/18/2013 0827    INo results found for: SPEP, UPEP  Lab Results  Component Value Date   WBC 4.5 07/06/2019   NEUTROABS 3.0 07/06/2019   HGB 10.2 (L) 07/06/2019   HCT 31.1 (L) 07/06/2019   MCV 96.0 07/06/2019   PLT 148 (L) 07/06/2019      Chemistry      Component Value Date/Time   NA 144 07/06/2019 1210   NA 146 (H) 01/25/2019 0957   NA 140 01/07/2017 0758   K 4.5 07/06/2019 1210   K 4.1 01/07/2017 0758   CL 115 (H) 07/06/2019 1210   CO2 21 (L) 07/06/2019 1210   CO2 24 01/07/2017 0758   BUN 33 (H) 07/06/2019 1210   BUN 25 01/25/2019 0957   BUN 33.6 (H) 01/07/2017 0758   CREATININE 1.61 (H) 07/06/2019 1210   CREATININE  1.62 (H) 04/28/2018 0915   CREATININE 1.5 (H) 01/07/2017 0758      Component Value Date/Time   CALCIUM 8.7 (L) 07/06/2019 1210   CALCIUM 9.3 01/07/2017 0758   ALKPHOS 120 07/06/2019 1210   ALKPHOS 115 01/07/2017 0758   AST 40 07/06/2019 1210   AST 43 (H) 04/28/2018 0915   AST 51 (H) 01/07/2017 0758   ALT 74 (H) 07/06/2019 1210   ALT 61 (H) 04/28/2018 0915   ALT 82 (H) 01/07/2017 0758   BILITOT 0.6 07/06/2019   1210   BILITOT 0.7 01/25/2019 0957   BILITOT 0.6 04/28/2018 0915   BILITOT 0.72 01/07/2017 0758       No results found for: LABCA2  No components found for: LABCA125  No results for input(s): INR in the last 168 hours.  Urinalysis    Component Value Date/Time   COLORURINE YELLOW 07/21/2015 2328   APPEARANCEUR CLEAR 07/21/2015 2328   LABSPEC 1.020 07/21/2015 2328   PHURINE 6.0 07/21/2015 2328   GLUCOSEU NEGATIVE 07/21/2015 2328   HGBUR TRACE (A) 07/21/2015 2328   BILIRUBINUR NEGATIVE 07/21/2015 2328   BILIRUBINUR neg 06/29/2013 1017   KETONESUR NEGATIVE 07/21/2015 2328   PROTEINUR 30 (A) 07/21/2015 2328   UROBILINOGEN negative 06/29/2013 1017   NITRITE NEGATIVE 07/21/2015 2328   LEUKOCYTESUR NEGATIVE 07/21/2015 2328    STUDIES: US Abdomen Complete  Result Date: 06/29/2019 CLINICAL DATA:  Elevated liver enzymes.  History of breast carcinoma EXAM: ABDOMEN ULTRASOUND COMPLETE COMPARISON:  Abdominal MRI April 27, 2019 FINDINGS: Gallbladder: Within the gallbladder, there are echogenic foci which move and shadow consistent with cholelithiasis. Largest gallstone measures 0.5 cm in length. No gallbladder wall thickening or pericholecystic fluid. No sonographic Murphy sign noted by sonographer. Common bile duct: Diameter: 3 mm. No intrahepatic, common hepatic, or common bile duct dilatation. Liver: No focal lesion identified. Within normal limits in parenchymal echogenicity. Portal vein is patent on color Doppler imaging with normal direction of blood flow towards the liver.  IVC: No abnormality visualized. Pancreas: There is no pancreatic mass or inflammatory focus. Spleen: Size and appearance within normal limits. Right Kidney: Length: 11.4 cm. Echogenicity within normal limits. No mass or hydronephrosis visualized. Left Kidney: Length: 11.2 cm. Echogenicity within normal limits. No mass or hydronephrosis visualized. Abdominal aorta: No aneurysm visualized. Other findings: No demonstrable ascites. IMPRESSION: 1. Cholelithiasis. No gallbladder wall thickening or pericholecystic fluid. 2. No liver lesions are evident by ultrasound. Small lesion in the right lobe of the liver seen on recent MR is not appreciable by ultrasound. 3.  Study otherwise unremarkable. Electronically Signed   By: William  Woodruff III M.D.   On: 06/29/2019 08:58   ECHOCARDIOGRAM COMPLETE  Result Date: 07/01/2019    ECHOCARDIOGRAM REPORT   Patient Name:   Darlene [name] Date of Exam: 07/01/2019 Medical Rec #:  [id number]           Height:       69.0 in Accession #:    [id number]          Weight:       172.0 lb Date of Birth:  10/09/1950           BSA:          1.938 m Patient Age:    68 years            BP:           121/66 mmHg Patient Gender: F                   HR:           62 bpm. Exam Location:  Outpatient Procedure: 2D Echo and Strain Analysis Indications:     Congestive Heart Failure I50.9  History:         Patient has prior history of Echocardiogram examinations, most                  recent 02/26/2019. Breast Cancer; Risk Factors:Hypertension,                    Diabetes and Dyslipidemia.  Sonographer:     Casey Kirkpatrick RDCS (AE) Referring Phys:  [id number] DALTON S MCLEAN Diagnosing Phys: Dalton Mclean MD IMPRESSIONS  1. Left ventricular ejection fraction, by estimation, is 55 to 60%. The left ventricle has normal function. The left ventricle has no regional wall motion abnormalities. Left ventricular diastolic parameters are consistent with Grade I diastolic dysfunction (impaired relaxation). GLS  -17.6%.  2. Right ventricular systolic function is normal. The right ventricular size is normal. There is normal pulmonary artery systolic pressure. The estimated right ventricular systolic pressure is 18.7 mmHg.  3. The mitral valve is normal in structure. No evidence of mitral valve regurgitation. No evidence of mitral stenosis.  4. The aortic valve is tricuspid. Aortic valve regurgitation is trivial. No aortic stenosis is present.  5. The inferior vena cava is normal in size with greater than 50% respiratory variability, suggesting right atrial pressure of 3 mmHg. FINDINGS  Left Ventricle: Left ventricular ejection fraction, by estimation, is 55 to 60%. The left ventricle has normal function. The left ventricle has no regional wall motion abnormalities. The left ventricular internal cavity size was normal in size. There is  no left ventricular hypertrophy. Left ventricular diastolic parameters are consistent with Grade I diastolic dysfunction (impaired relaxation). Right Ventricle: The right ventricular size is normal. No increase in right ventricular wall thickness. Right ventricular systolic function is normal. There is normal pulmonary artery systolic pressure. The tricuspid regurgitant velocity is 1.98 m/s, and  with an assumed right atrial pressure of 3 mmHg, the estimated right ventricular systolic pressure is 18.7 mmHg. Left Atrium: Left atrial size was normal in size. Right Atrium: Right atrial size was normal in size. Pericardium: There is no evidence of pericardial effusion. Mitral Valve: The mitral valve is normal in structure. No evidence of mitral valve regurgitation. No evidence of mitral valve stenosis. Tricuspid Valve: The tricuspid valve is normal in structure. Tricuspid valve regurgitation is trivial. Aortic Valve: The aortic valve is tricuspid. Aortic valve regurgitation is trivial. No aortic stenosis is present. Pulmonic Valve: The pulmonic valve was normal in structure. Pulmonic valve  regurgitation is not visualized. Aorta: The aortic root is normal in size and structure. Venous: The inferior vena cava is normal in size with greater than 50% respiratory variability, suggesting right atrial pressure of 3 mmHg. IAS/Shunts: No atrial level shunt detected by color flow Doppler.  LEFT VENTRICLE PLAX 2D LVIDd:         4.50 cm  Diastology LVIDs:         3.20 cm  LV e' lateral:   6.68 cm/s LV PW:         1.00 cm  LV E/e' lateral: 14.9 LV IVS:        0.90 cm  LV e' medial:    6.13 cm/s LVOT diam:     2.00 cm  LV E/e' medial:  16.3 LV SV:         62 LV SV Index:   32 LVOT Area:     3.14 cm  RIGHT VENTRICLE RV S prime:     9.08 cm/s TAPSE (M-mode): 1.7 cm LEFT ATRIUM             Index       RIGHT ATRIUM          Index LA diam:        3.30 cm 1.70 cm/m  RA Area:     9.98 cm LA Vol (A2C):   64.7 ml   33.39 ml/m RA Volume:   16.10 ml 8.31 ml/m LA Vol (A4C):   46.1 ml 23.79 ml/m LA Biplane Vol: 56.5 ml 29.16 ml/m  AORTIC VALVE LVOT Vmax:   77.60 cm/s LVOT Vmean:  58.300 cm/s LVOT VTI:    0.198 m  AORTA Ao Root diam: 2.90 cm MITRAL VALVE                TRICUSPID VALVE MV Area (PHT): 2.73 cm     TR Peak grad:   15.7 mmHg MV Decel Time: 278 msec     TR Vmax:        198.00 cm/s MV E velocity: 99.80 cm/s MV A velocity: 102.00 cm/s  SHUNTS MV E/A ratio:  0.98         Systemic VTI:  0.20 m                             Systemic Diam: 2.00 cm Loralie Champagne MD Electronically signed by Loralie Champagne MD Signature Date/Time: 07/01/2019/10:35:09 AM    Final (Updated)      ASSESSMENT: 69 y.o. [place] woman status post right breast upper outer quadrant and right axillary lymph node biopsy 07/19/2014 for a cT2  pN1, stage IIB  invasive ductal carcinoma, grade 2, estrogen and progesterone receptor negative, with an MIB-1 of 90%, and HER-2 amplified  (1) neoadjuvant chemotherapy started 08/08/2014, consisting of carboplatin, docetaxel, trastuzumab and pertuzumab given every 3 weeks 6, completed 11/21/2014  (a)  breast MRI after initial 3 cycles shows a significant response  (b) breast MRI after 6 cycles shows a complete radiologic response   (2) trastuzumab continued through 10/02/2015 (to end of capecitabine therapy)  (a) echo 02/13/2015 shows an EF of 60-65%  (b)  echo 05/09/2015 shows an ejection fraction of 60-65%  (c) echocardiogram 06/27/2015 shows an ejection fraction of 55-60%.  (d) echocardiogram 01/22/2017 shows an ejection fraction of 55-60%  (3) right lumpectomy with sentinel lymph node biopsy on 12/27/14 showed a residual  ypT2 ypN1a, stage IIB invasive ductal carcinoma, grade 2, with repeat estrogen and progesterone receptors again negative  (4) status post right oncoplastic surgery (with left reduction mammoplasty) 01/06/2015 showing in the right breast multiple foci of intralymphatic carcinoma in random sections  (5)  Right modified radical mastectomy 02/15/2014 showed no residual invasive disease; there was DCIS but margins were negative; all 15 axillary lymph nodes were clear; ypTis ypN0  (a)  The patient has decided against reconstruction.  (6)  Postmastectomy radiation completed 05/18/2015 with capecitabine sensitization  (7) adjuvant capecitabine continued through 09/27/2015  (8) hepatic steatosis with increased fibrosis score (at risk for cirrhosis)  (9) thyroid biopsy (left lobe inferiorly) 2 shows benign tissue 06/28/2015  (10) LOCAL RECURRENCE:  (a) PET scan 12/06/2015 Notes a right retropectoral lymph node measuring 0.9 cm with an SUV max of 5.5  (b) repeat PET scan 03/11/2016 finds the right subpectoral lymph node to now measure 1.3 cm with an SUV max of 11.8.  (c) PET scan 05/07/2016 shows a significant response to T-DM 1  (d) PET scan 07/10/2017 is clear except for what appears to be a single new liver lesion   (11) started T-DM1 03/21/2016, completed 8 doses 08/13/2016  (a) PET scan 08/19/2016 shows a good response but measurable residual disease  (b) PET scan  on 01/16/2017 shows a continuing response  (12) maintenance trastuzumab started 09/03/2016--changed to every 28 days as of 01/28/2017  (a) repeat echocardiogram  07/08/2016 shows an ejection fraction of 60-65%.  (b) echocardiogram on 10/08/2016 demonstrates LVEF of 55-60% (followed by Dr. Haroldine Laws)  (c) echo 01/22/2017 shows an ejection fraction in the 55-60% range  (d) echo 04/24/2017 shows an ejection fraction in the 55-60% range (recommendation for echo every 6 months by Dr. Aundra Dubin)  (e) trastuzumab changed to T-DM 1 after trastuzumab dose 07/15/2017  METASTATIC DISEASE: July 2019 (13)  Radiation to the area of persistent disease in the right upper chest completed 10/30/2016   (14) T-DM 1 resumed 08/12/2017, repeated every 21 days  (a) baseline liver MRI 08/02/2017 shows a 1.4 cm mass, "hot" on PET 07/10/2017  (b) repeat liver MRI 11/18/2017 after 3 cycles of T-DM 1, shows slight enlargement  (c) T-DM 1 discontinued after 09/23/2017 dose  (15) trastuzumab resumed 11/18/2017, to be repeated every 4 weeks,   (a) changed back to every 3 weeks in 03/2018  (b) echo 09/18/2018 shows an ejection fraction in the 50-55% range.  (c) pertuzumab added with 10/06/2018 dose  (d) trastuzumab/pertuzumab changed to every 4 weeks after 01/19/2019 dose  (e) liver MRI 04/27/2019 shows mild sclerosis, no change in right hepatic lobe treated metastases, with no new lesions; prominent porta hepatis nodes were felt likely reactive  (f) abdominal ultrasound 06/28/2027 shows no focal liver lesions, no ascites  (16) liver biopsy 11/03/2017 shows adenocarcinoma, most consistent with an upper gastrointestinal primary; this tumor was estrogen receptor and HER-2 negative  (a) GI workup for occult primary ([place]) negative  (b) PET 12/09/2017 shows no obvious primary.  There is a right subpectoral lymph node noted as well  (c) CA 27-29 and CA 19-9 uninformative; CEA 6.72 OCT 2019  (d) negative hepatitis B serologies May  2021   (17) started Abraxane on 12/111/2019, given days 1 and 8 on a 21 day cycle   (a) restaging with MRI liver 02/24/2018 shows a gratifying initial response  (b) PET scan 05/14/2018 shows resolution of the measurable disease in the liver  (c) liver MRI 09/14/2018 shows no change compared to prior PET and MRI.  (d) Abraxane discontinued after 09/01/2018 dose  (18) consider Yttrium or other local treatment to liver per IR if liver recurrence develops   PLAN: Darlene [name] looks terrific and is tolerating her treatment well.  She essentially has no diarrhea from the Pertuzumab.  She maintains an excellent ejection fraction.  We are going to continue every 4-week treatments until there is evidence of disease progression.  We reviewed the recent ultrasound which is very favorable.  Of course this does not give [name] all the detail of the liver MRI and we would like to repeat that sometime late August.  It is not clear why she is scheduled for a visit with every treatment for the next 3 months.  We thought that was a little excessive so we have changed his so she will be 6 treated here with no visits until the September visit and of course we will obtain the liver MRI just before that as stated  We discussed neuropathy issues and fall precautions  We also discussed diet issues.  Total encounter time 30 minutes.*  Total encounter time 30 minutes.Sarajane Jews C. Richetta Cubillos, MD 07/06/19 6:14 PM Medical Oncology and Hematology [facility] [place], [place] [address] Tel. [phone]    Fax. [phone]   I, Wilburn Mylar, am acting as scribe for Dr. Virgie Dad. Loreto Loescher.  Lindie Spruce MD, have reviewed the above documentation for accuracy  and completeness, and I agree with the above.    *Total Encounter Time as defined by the Centers for Medicare and Medicaid Services includes, in addition to the face-to-face time of a patient visit (documented in the note above)  non-face-to-face time: obtaining and reviewing outside history, ordering and reviewing medications, tests or procedures, care coordination (communications with other health care professionals or caregivers) and documentation in the medical record. 

## 2019-07-06 NOTE — Patient Instructions (Signed)
[facility] Discharge Instructions for Patients Receiving Chemotherapy  Today you received the following chemotherapy agents: trastuzumab and pertuzumab.  To help prevent nausea and vomiting after your treatment, we encourage you to take your nausea medication as directed.   If you develop nausea and vomiting that is not controlled by your nausea medication, call the clinic.   BELOW ARE SYMPTOMS THAT SHOULD BE REPORTED IMMEDIATELY:  *FEVER GREATER THAN 100.5 F  *CHILLS WITH OR WITHOUT FEVER  NAUSEA AND VOMITING THAT IS NOT CONTROLLED WITH YOUR NAUSEA MEDICATION  *UNUSUAL SHORTNESS OF BREATH  *UNUSUAL BRUISING OR BLEEDING  TENDERNESS IN MOUTH AND THROAT WITH OR WITHOUT PRESENCE OF ULCERS  *URINARY PROBLEMS  *BOWEL PROBLEMS  UNUSUAL RASH Items with * indicate a potential emergency and should be followed up as soon as possible.  Feel free to call the clinic should you have any questions or concerns. The clinic phone number is [phone].  Please show the CHEMO ALERT CARD at check-in to the Emergency Department and triage nurse.   

## 2019-07-07 ENCOUNTER — Telehealth: Payer: Self-pay | Admitting: Oncology

## 2019-07-07 DIAGNOSIS — S52571D Other intraarticular fracture of lower end of right radius, subsequent encounter for closed fracture with routine healing: Secondary | ICD-10-CM | POA: Diagnosis not present

## 2019-07-07 DIAGNOSIS — D631 Anemia in chronic kidney disease: Secondary | ICD-10-CM | POA: Diagnosis not present

## 2019-07-07 DIAGNOSIS — I129 Hypertensive chronic kidney disease with stage 1 through stage 4 chronic kidney disease, or unspecified chronic kidney disease: Secondary | ICD-10-CM | POA: Diagnosis not present

## 2019-07-07 DIAGNOSIS — E1142 Type 2 diabetes mellitus with diabetic polyneuropathy: Secondary | ICD-10-CM | POA: Diagnosis not present

## 2019-07-07 DIAGNOSIS — E1122 Type 2 diabetes mellitus with diabetic chronic kidney disease: Secondary | ICD-10-CM | POA: Diagnosis not present

## 2019-07-07 DIAGNOSIS — M21372 Foot drop, left foot: Secondary | ICD-10-CM | POA: Diagnosis not present

## 2019-07-07 DIAGNOSIS — N189 Chronic kidney disease, unspecified: Secondary | ICD-10-CM | POA: Diagnosis not present

## 2019-07-07 DIAGNOSIS — E11319 Type 2 diabetes mellitus with unspecified diabetic retinopathy without macular edema: Secondary | ICD-10-CM | POA: Diagnosis not present

## 2019-07-07 DIAGNOSIS — S52572D Other intraarticular fracture of lower end of left radius, subsequent encounter for closed fracture with routine healing: Secondary | ICD-10-CM | POA: Diagnosis not present

## 2019-07-07 LAB — CEA (IN HOUSE-CHCC): CEA (CHCC-In House): 2.39 ng/mL (ref 0.00–5.00)

## 2019-07-07 NOTE — Telephone Encounter (Signed)
Cancelled office visits that were between other appts that were already on pt's schedule per 6/15 los. No other changes were made.

## 2019-07-09 DIAGNOSIS — N189 Chronic kidney disease, unspecified: Secondary | ICD-10-CM | POA: Diagnosis not present

## 2019-07-09 DIAGNOSIS — I129 Hypertensive chronic kidney disease with stage 1 through stage 4 chronic kidney disease, or unspecified chronic kidney disease: Secondary | ICD-10-CM | POA: Diagnosis not present

## 2019-07-09 DIAGNOSIS — S52572D Other intraarticular fracture of lower end of left radius, subsequent encounter for closed fracture with routine healing: Secondary | ICD-10-CM | POA: Diagnosis not present

## 2019-07-09 DIAGNOSIS — S52571D Other intraarticular fracture of lower end of right radius, subsequent encounter for closed fracture with routine healing: Secondary | ICD-10-CM | POA: Diagnosis not present

## 2019-07-09 DIAGNOSIS — E1122 Type 2 diabetes mellitus with diabetic chronic kidney disease: Secondary | ICD-10-CM | POA: Diagnosis not present

## 2019-07-09 DIAGNOSIS — D631 Anemia in chronic kidney disease: Secondary | ICD-10-CM | POA: Diagnosis not present

## 2019-07-09 DIAGNOSIS — E11319 Type 2 diabetes mellitus with unspecified diabetic retinopathy without macular edema: Secondary | ICD-10-CM | POA: Diagnosis not present

## 2019-07-09 DIAGNOSIS — M21372 Foot drop, left foot: Secondary | ICD-10-CM | POA: Diagnosis not present

## 2019-07-09 DIAGNOSIS — E1142 Type 2 diabetes mellitus with diabetic polyneuropathy: Secondary | ICD-10-CM | POA: Diagnosis not present

## 2019-07-12 DIAGNOSIS — E1142 Type 2 diabetes mellitus with diabetic polyneuropathy: Secondary | ICD-10-CM | POA: Diagnosis not present

## 2019-07-12 DIAGNOSIS — N189 Chronic kidney disease, unspecified: Secondary | ICD-10-CM | POA: Diagnosis not present

## 2019-07-12 DIAGNOSIS — M21372 Foot drop, left foot: Secondary | ICD-10-CM | POA: Diagnosis not present

## 2019-07-12 DIAGNOSIS — E1122 Type 2 diabetes mellitus with diabetic chronic kidney disease: Secondary | ICD-10-CM | POA: Diagnosis not present

## 2019-07-12 DIAGNOSIS — I129 Hypertensive chronic kidney disease with stage 1 through stage 4 chronic kidney disease, or unspecified chronic kidney disease: Secondary | ICD-10-CM | POA: Diagnosis not present

## 2019-07-12 DIAGNOSIS — D631 Anemia in chronic kidney disease: Secondary | ICD-10-CM | POA: Diagnosis not present

## 2019-07-12 DIAGNOSIS — E11319 Type 2 diabetes mellitus with unspecified diabetic retinopathy without macular edema: Secondary | ICD-10-CM | POA: Diagnosis not present

## 2019-07-12 DIAGNOSIS — S52571D Other intraarticular fracture of lower end of right radius, subsequent encounter for closed fracture with routine healing: Secondary | ICD-10-CM | POA: Diagnosis not present

## 2019-07-12 DIAGNOSIS — S52572D Other intraarticular fracture of lower end of left radius, subsequent encounter for closed fracture with routine healing: Secondary | ICD-10-CM | POA: Diagnosis not present

## 2019-07-13 DIAGNOSIS — E1122 Type 2 diabetes mellitus with diabetic chronic kidney disease: Secondary | ICD-10-CM | POA: Diagnosis not present

## 2019-07-13 DIAGNOSIS — S52571D Other intraarticular fracture of lower end of right radius, subsequent encounter for closed fracture with routine healing: Secondary | ICD-10-CM | POA: Diagnosis not present

## 2019-07-13 DIAGNOSIS — E1142 Type 2 diabetes mellitus with diabetic polyneuropathy: Secondary | ICD-10-CM | POA: Diagnosis not present

## 2019-07-13 DIAGNOSIS — M21372 Foot drop, left foot: Secondary | ICD-10-CM | POA: Diagnosis not present

## 2019-07-13 DIAGNOSIS — I129 Hypertensive chronic kidney disease with stage 1 through stage 4 chronic kidney disease, or unspecified chronic kidney disease: Secondary | ICD-10-CM | POA: Diagnosis not present

## 2019-07-13 DIAGNOSIS — N189 Chronic kidney disease, unspecified: Secondary | ICD-10-CM | POA: Diagnosis not present

## 2019-07-13 DIAGNOSIS — S52572D Other intraarticular fracture of lower end of left radius, subsequent encounter for closed fracture with routine healing: Secondary | ICD-10-CM | POA: Diagnosis not present

## 2019-07-13 DIAGNOSIS — D631 Anemia in chronic kidney disease: Secondary | ICD-10-CM | POA: Diagnosis not present

## 2019-07-13 DIAGNOSIS — E11319 Type 2 diabetes mellitus with unspecified diabetic retinopathy without macular edema: Secondary | ICD-10-CM | POA: Diagnosis not present

## 2019-07-19 DIAGNOSIS — R296 Repeated falls: Secondary | ICD-10-CM | POA: Diagnosis not present

## 2019-07-19 DIAGNOSIS — M25672 Stiffness of left ankle, not elsewhere classified: Secondary | ICD-10-CM | POA: Diagnosis not present

## 2019-07-19 DIAGNOSIS — M6259 Muscle wasting and atrophy, not elsewhere classified, multiple sites: Secondary | ICD-10-CM | POA: Diagnosis not present

## 2019-07-19 DIAGNOSIS — R269 Unspecified abnormalities of gait and mobility: Secondary | ICD-10-CM | POA: Diagnosis not present

## 2019-07-21 DIAGNOSIS — E113512 Type 2 diabetes mellitus with proliferative diabetic retinopathy with macular edema, left eye: Secondary | ICD-10-CM | POA: Diagnosis not present

## 2019-07-23 ENCOUNTER — Ambulatory Visit: Payer: Medicare Other | Admitting: Certified Nurse Midwife

## 2019-07-23 DIAGNOSIS — R296 Repeated falls: Secondary | ICD-10-CM | POA: Diagnosis not present

## 2019-07-23 DIAGNOSIS — R269 Unspecified abnormalities of gait and mobility: Secondary | ICD-10-CM | POA: Diagnosis not present

## 2019-07-23 DIAGNOSIS — M6259 Muscle wasting and atrophy, not elsewhere classified, multiple sites: Secondary | ICD-10-CM | POA: Diagnosis not present

## 2019-07-23 DIAGNOSIS — M25672 Stiffness of left ankle, not elsewhere classified: Secondary | ICD-10-CM | POA: Diagnosis not present

## 2019-07-26 DIAGNOSIS — M25672 Stiffness of left ankle, not elsewhere classified: Secondary | ICD-10-CM | POA: Diagnosis not present

## 2019-07-26 DIAGNOSIS — R269 Unspecified abnormalities of gait and mobility: Secondary | ICD-10-CM | POA: Diagnosis not present

## 2019-07-26 DIAGNOSIS — R296 Repeated falls: Secondary | ICD-10-CM | POA: Diagnosis not present

## 2019-07-26 DIAGNOSIS — M6259 Muscle wasting and atrophy, not elsewhere classified, multiple sites: Secondary | ICD-10-CM | POA: Diagnosis not present

## 2019-07-26 NOTE — Progress Notes (Signed)
07/29/2019 JET TRAYNHAM [birth year] [id number]   HPI:  Darlene [name] is a 69 y.o. female patient of Dr Aundra Dubin, who presents today for a lipid clinic evaluation.  See pertinent past medical history below.  She has been followed by Dr. Aundra Dubin due to her history of HER-2 positive breast cancer and concern of decrease in EF due to Herceptin use.  She has no history of MI, PCI or stroke, however a recent coronary calcium test showed her to have a coronary calcium score of 3653, which puts her in the 99th percentile for her age and sex.  She was started on rosuvastatin, but had to stop after a few months due to an increase in LFT's greater than 3 x UNL.  Patient recalls trying atorvastatin in the past, does not recall any specific problems with it, but due to her elevated LFT's (to 125 and 245), would not consider re-challenging her with any statin drug in the future.      Past Medical History: hypertension Controlled on losartan and carvedilol  CAD Coronary calcium score 3653 - 99th percentile  OSA On CPAP  Breast cancer Currently on herceptin and Perjeta, EF stable at 55-60%  DM2 A1 5.6 (was as high as 7.7 prior to Roux-en-Y bariatric surgery), some neuropathy concerns.     Current Medications: none (stopped rosuvastatin 06/09/19)  Cholesterol Goals: LDL < 70   Intolerant/previously tried: atorvastatin, rosuvastatin  Family history: father died from CHF at 17, mother died at 24 cerebral hemorrhage; 2 siblings, 1 with DM , one with hypertension  Labs:  4/21: TC 109, TG 89, HDL 49, LDL 42  6/21  AST 40, ALT 74  (peaked 1 month ago on rosvuastatin at AST 125, ALT 245   07/28/19:  TC 144, TG 66, HDL 74, LDL 57   Current Outpatient Medications  Medication Sig Dispense Refill  . Accu-Chek Softclix Lancets lancets 1 each by Other route 4 (four) times daily. Test blood sugars 4 x times daily 100 each 1  . aspirin EC 81 MG tablet Take 1 tablet (81 mg total) by mouth daily. 90 tablet 3  .  Blood Glucose Calibration (ACCU-CHEK GUIDE CONTROL) LIQD 1 Bottle by In Vitro route as needed. 1 each 0  . Blood Glucose Monitoring Suppl (ACCU-CHEK GUIDE) w/Device KIT 1 each by Does not apply route 4 (four) times daily. Use to test blood sugars 4x a day 1 kit 0  . buPROPion (WELLBUTRIN SR) 200 MG 12 hr tablet TAKE 1 TABLET AT 12 NOON. 90 tablet 0  . CALCIUM CITRATE-VITAMIN D PO Take 1 tablet by mouth 3 (three) times daily.     . carvedilol (COREG) 3.125 MG tablet TAKE 1 TABLET BY MOUTH TWICE DAILY. 180 tablet 3  . cholecalciferol (VITAMIN D) 1000 units tablet Take 1,000 Units by mouth daily.    . cholestyramine (QUESTRAN) 4 g packet Take 1 packet (4 g total) by mouth 2 (two) times daily as needed. 10 each 12  . Cyanocobalamin (VITAMIN B-12 PO) Take 1 tablet by mouth every 14 (fourteen) days.    . diphenhydrAMINE (BENADRYL) 25 MG tablet Take 25 mg by mouth daily as needed for allergies or sleep.     [name] glucose blood (ACCU-CHEK GUIDE) test strip 1 each by Other route 4 (four) times daily. Accu Chek Guide  Test blood sugars 4 times a day 100 each 1  . insulin glargine (LANTUS SOLOSTAR) 100 UNIT/ML Solostar Pen Inject 10 Units into the skin daily. 9 mL  0  . Insulin Pen Needle (SURE COMFORT PEN NEEDLES) 32G X 4 MM MISC USE WITH INJECTIONS 100 each 0  . liraglutide (VICTOZA) 18 MG/3ML SOPN INJECT 1.8MG (0.3ML) SUBCUTANEOUSLY EACH AM 9 mL 0  . loratadine (CLARITIN) 10 MG tablet Take 10 mg by mouth as needed for allergies.    [name] losartan (COZAAR) 100 MG tablet Take 1 tablet (100 mg total) by mouth daily. 90 tablet 0  . ondansetron (ZOFRAN) 4 MG tablet Take 1 tablet (4 mg total) by mouth every 8 (eight) hours as needed for nausea or vomiting. 20 tablet 0  . Pediatric Multivitamins-Iron (FLINTSTONES PLUS IRON) chewable tablet Chew 1 tablet by mouth 2 (two) times daily.    . Probiotic Product (CVS PROBIOTIC PO) Take 1 capsule by mouth daily.     . prochlorperazine (COMPAZINE) 10 MG tablet Take 1 tablet (10 mg  total) by mouth every 6 (six) hours as needed for nausea or vomiting. 30 tablet 0  . Propylene Glycol (SYSTANE BALANCE OP) Place 1-2 drops into both eyes 3 (three) times daily as needed (for dry eyes).     [name] senna-docusate (SENNA PLUS) 8.6-50 MG tablet Take 1 tablet by mouth daily as needed for moderate constipation.     . topiramate (TOPAMAX) 25 MG tablet Take 1 tablet (25 mg total) by mouth daily. 90 tablet 0  . vortioxetine HBr (TRINTELLIX) 20 MG TABS tablet Take 1 tablet (20 mg total) by mouth at bedtime. 90 tablet 0   No current facility-administered medications for this visit.    Allergies  Allergen Reactions  . Nsaids Other (See Comments)    H/o gastric bypass - avoid NSAIDs  . Tape Hives, Rash and Other (See Comments)    Adhesives in bandaids    Past Medical History:  Diagnosis Date  . Anemia    hx of - during 1st round chemo  . Anxiety   . Arthritis    in fingers, shoulders  . Back pain   . Balance problem   . Breast cancer ([place])   . Breast cancer of upper-outer quadrant of right female breast ([place]) 07/22/2014  . Bunion, right foot   . Chronic kidney disease    creatininei levels high per pt   . Clumsiness   . Constipation   . Depression   . Diabetes mellitus without complication ([place])    25+ years type 2   . Diabetic retinopathy ([place])    torn retina  . Eating disorder    binge eating  . Epistaxis 08/26/2014  . Fatty liver   . HCAP (healthcare-associated pneumonia) 12/18/2014  . Heart murmur    never had any problems  . History of radiation therapy 04/05/15-05/19/15   right chest wall was treated to 50.4 Gy in 28 fractions, right mastectomy scar was treated to 10 Gy in 5 fractions  . Hyperlipidemia   . Hypertension   . Joint pain   . Multiple food allergies   . Neuromuscular disorder ([place])    diabetic neuropathy  . Obesity   . Obstructive sleep apnea on CPAP    does not use cpap all the time has lost 160 lbs   . Ovarian cyst rupture    possible  .  Pneumonia   . Pneumonia 11/2014  . Radiation 04/05/15-05/19/15   right chest wall 50.4 Gy, mastectomy scar 10 Gy  . Sleep apnea   . Thyroid nodule 06/2014    Last menstrual period 10/22/2007.   hyperlipidemia Patient with Coronary calcium  score of  3653 which puts her in the 99th percentile.  She has not had any prior coronary events.  Will need to repeat her lipid labs now that she is off rosuvastatin, as her most recent LDL on medication was 42.  Insurance companies will not pay for PCSK-9 inhibitors for patients with LDL < 70.  She is agreeable to Repatha treatment should her LDL be high enough  Supplemental:  Patient LDL came back on 7/7 with LDL of 57.  Not eligible for PCSK-9 with those levels.  Will call patient and discuss options of ezetimibe or bempedoic acid.     Tommy Medal PharmD CPP [facility] [address] [place], [place] [address] [phone]

## 2019-07-27 ENCOUNTER — Other Ambulatory Visit: Payer: Self-pay

## 2019-07-27 ENCOUNTER — Ambulatory Visit (INDEPENDENT_AMBULATORY_CARE_PROVIDER_SITE_OTHER): Payer: Medicare PPO | Admitting: Pharmacist Clinician (PhC)/ Clinical Pharmacy Specialist

## 2019-07-27 DIAGNOSIS — E785 Hyperlipidemia, unspecified: Secondary | ICD-10-CM | POA: Diagnosis not present

## 2019-07-27 NOTE — Patient Instructions (Addendum)
Your Results:             Your most recent labs Goal  Total Cholesterol 109 < 200  Triglycerides 89 < 150  HDL (happy/good cholesterol) 49 > 40  LDL (lousy/bad cholesterol 42 < 70   < 100   Medication changes:  We will start the paperwork to get Repatha covered by your insurance.  Once approved [facility] will call to let you know it is available to pick up.    Lab orders:  Go to the lab in the next few days to get updated cholesterol labs drawn.  Patient Assistance:  The [employer] foundation offers assistance to help pay for medication copays.  They will cover copays for all cholesterol lowering meds, including statins, fibrates, omega-3 oils, ezetimibe, Repatha, Praluent, Nexletol, Nexlizet.  The cards are usually good for $2,500 or 12 months, whichever comes first. 1. Go to healthwellfoundation.org 2. Click on "Apply Now" 3. Answer questions as to whom is applying (patient or representative) 4. Your disease fund will be "hypercholesterolemia - Medicare access" 5. They will ask questions about finances and which medications you are taking for cholesterol 6. When you submit, the approval is usually within minutes.  You will need to print the card information from the site 7. You will need to show this information to your pharmacy, they will bill your Medicare Part D plan first -then bill [employer] --for the copay.   You can also call them at [phone], although the hold times can be quite long.   Thank you for choosing [facility]

## 2019-07-28 ENCOUNTER — Other Ambulatory Visit: Payer: Self-pay

## 2019-07-28 DIAGNOSIS — R296 Repeated falls: Secondary | ICD-10-CM | POA: Diagnosis not present

## 2019-07-28 DIAGNOSIS — M6259 Muscle wasting and atrophy, not elsewhere classified, multiple sites: Secondary | ICD-10-CM | POA: Diagnosis not present

## 2019-07-28 DIAGNOSIS — R269 Unspecified abnormalities of gait and mobility: Secondary | ICD-10-CM | POA: Diagnosis not present

## 2019-07-28 DIAGNOSIS — M25672 Stiffness of left ankle, not elsewhere classified: Secondary | ICD-10-CM | POA: Diagnosis not present

## 2019-07-28 DIAGNOSIS — E785 Hyperlipidemia, unspecified: Secondary | ICD-10-CM | POA: Diagnosis not present

## 2019-07-28 LAB — HEPATIC FUNCTION PANEL
ALT: 63 IU/L — ABNORMAL HIGH (ref 0–32)
AST: 46 IU/L — ABNORMAL HIGH (ref 0–40)
Albumin: 4 g/dL (ref 3.8–4.8)
Alkaline Phosphatase: 120 IU/L (ref 48–121)
Bilirubin Total: 0.5 mg/dL (ref 0.0–1.2)
Bilirubin, Direct: 0.16 mg/dL (ref 0.00–0.40)
Total Protein: 6.2 g/dL (ref 6.0–8.5)

## 2019-07-28 LAB — LIPID PANEL
Chol/HDL Ratio: 1.9 ratio (ref 0.0–4.4)
Cholesterol, Total: 144 mg/dL (ref 100–199)
HDL: 74 mg/dL (ref >39)
LDL Chol Calc (NIH): 57 mg/dL (ref 0–99)
Triglycerides: 66 mg/dL (ref 0–149)
VLDL Cholesterol Cal: 13 mg/dL (ref 5–40)

## 2019-07-29 ENCOUNTER — Encounter: Payer: Self-pay | Admitting: Pharmacist Clinician (PhC)/ Clinical Pharmacy Specialist

## 2019-07-29 NOTE — Assessment & Plan Note (Addendum)
Patient with Coronary calcium score of  3653 which puts her in the 99th percentile.  She has not had any prior coronary events.  Will need to repeat her lipid labs now that she is off rosuvastatin, as her most recent LDL on medication was 42.  Insurance companies will not pay for PCSK-9 inhibitors for patients with LDL < 70.  She is agreeable to Repatha treatment should her LDL be high enough  Supplemental:  Patient LDL came back on 7/7 with LDL of 57.  Not eligible for PCSK-9 with those levels.  Will call patient and discuss options of ezetimibe or bempedoic acid.

## 2019-07-29 NOTE — Progress Notes (Signed)
Would aim for the bempedoic acid.

## 2019-07-30 ENCOUNTER — Telehealth: Payer: Self-pay | Admitting: Pharmacist Clinician (PhC)/ Clinical Pharmacy Specialist

## 2019-07-30 ENCOUNTER — Encounter: Payer: Self-pay | Admitting: Podiatry

## 2019-07-30 ENCOUNTER — Ambulatory Visit: Payer: Medicare PPO | Admitting: Podiatry

## 2019-07-30 ENCOUNTER — Other Ambulatory Visit: Payer: Self-pay

## 2019-07-30 VITALS — Temp 98.0°F

## 2019-07-30 DIAGNOSIS — M2041 Other hammer toe(s) (acquired), right foot: Secondary | ICD-10-CM

## 2019-07-30 DIAGNOSIS — L84 Corns and callosities: Secondary | ICD-10-CM

## 2019-07-30 DIAGNOSIS — M21372 Foot drop, left foot: Secondary | ICD-10-CM

## 2019-07-30 DIAGNOSIS — M25672 Stiffness of left ankle, not elsewhere classified: Secondary | ICD-10-CM | POA: Diagnosis not present

## 2019-07-30 DIAGNOSIS — M2011 Hallux valgus (acquired), right foot: Secondary | ICD-10-CM

## 2019-07-30 DIAGNOSIS — G620 Drug-induced polyneuropathy: Secondary | ICD-10-CM

## 2019-07-30 DIAGNOSIS — M21371 Foot drop, right foot: Secondary | ICD-10-CM | POA: Diagnosis not present

## 2019-07-30 DIAGNOSIS — T451X5A Adverse effect of antineoplastic and immunosuppressive drugs, initial encounter: Secondary | ICD-10-CM

## 2019-07-30 DIAGNOSIS — E1142 Type 2 diabetes mellitus with diabetic polyneuropathy: Secondary | ICD-10-CM | POA: Diagnosis not present

## 2019-07-30 DIAGNOSIS — R296 Repeated falls: Secondary | ICD-10-CM | POA: Diagnosis not present

## 2019-07-30 DIAGNOSIS — R269 Unspecified abnormalities of gait and mobility: Secondary | ICD-10-CM | POA: Diagnosis not present

## 2019-07-30 DIAGNOSIS — M6259 Muscle wasting and atrophy, not elsewhere classified, multiple sites: Secondary | ICD-10-CM | POA: Diagnosis not present

## 2019-07-30 NOTE — Telephone Encounter (Signed)
LDL cholesterol at 57.  Not a candidate for Repatha, will consider Nexletol.  LMOM for patient to return call

## 2019-07-30 NOTE — Progress Notes (Signed)
  Subjective:  Patient ID: Darlene [name], female    DOB: Jun 12, 1950,  MRN: [id number]  Chief Complaint  Patient presents with  . Diabetic Shoes    Pt stated, "I want to get diabetic shoes; I already have an AFO Brace"; pt Diabetic Type 2; Sugar=84 this am; A1C=5.6 (05/11/19)    69 y.o. female presents with the above complaint. History confirmed with patient.   Objective:  Physical Exam: warm, good capillary refill, no trophic changes or ulcerative lesions, normal DP and PT pulses and reduced sensation at plantar soles of feet and pulps of toes. Left Foot: bunion deformity noted and Hammertoes, preulcerative callus plantar hallux  Right Foot: bunion deformity noted and Hammertoe deformity noted   Assessment:   1. Acquired bilateral foot drop   2. Hallux valgus of right foot   3. Hammer toe of second toe of right foot   4. Pre-ulcerative calluses   5. Controlled type 2 diabetes mellitus with diabetic polyneuropathy, without long-term current use of insulin ([place])   6. Chemotherapy-induced peripheral neuropathy ([place])      Plan:  Patient was evaluated and treated and all questions answered.   Patient educated on diabetes. Discussed proper diabetic foot care and discussed risks and complications of disease. Educated patient in depth on reasons to return to the office immediately should he/she discover anything concerning or new on the feet. All questions answered. Discussed proper shoes as well.  Referral placed to pedorthist for diabetic shoe fitting.   Return in about 1 year (around 07/29/2020).

## 2019-08-02 DIAGNOSIS — S52502D Unspecified fracture of the lower end of left radius, subsequent encounter for closed fracture with routine healing: Secondary | ICD-10-CM | POA: Diagnosis not present

## 2019-08-02 DIAGNOSIS — S52501D Unspecified fracture of the lower end of right radius, subsequent encounter for closed fracture with routine healing: Secondary | ICD-10-CM | POA: Diagnosis not present

## 2019-08-02 MED ORDER — NEXLETOL 180 MG PO TABS: 180.0000 mg | ORAL_TABLET | Freq: Every day | ORAL | 0 refills | Status: DC

## 2019-08-02 MED ORDER — NEXLETOL 180 MG PO TABS: 180.0000 mg | ORAL_TABLET | Freq: Every day | ORAL | 6 refills | Status: DC

## 2019-08-02 NOTE — Telephone Encounter (Signed)
Patient agreeable to trial of Nexletol 180 mg qd.  She was concerned about risk of gout, explained incidence and patient voiced understanding.    2 wk sample given and rx sent to [facility]

## 2019-08-02 NOTE — Telephone Encounter (Signed)
Patient returned call, left VM.  Okay with starting Nexletol.    [facility] for patient Monday July 12, offering 2 wk samples and need to know which pharmacy to send prescription to.

## 2019-08-02 NOTE — Progress Notes (Signed)
69 y.o. G1P0010 Married White or Caucasian Not Hispanic or Latino female here for annual exam. No vaginal bleeding. Sexually active without penetration (husband with ED). Husband with DM and prostate cancer.   H/O breast caner with liver metastasis. On monthly infusions, in remission.   H/O Diabetes. Last HgbA1C in 4/21 was 5.6. Bad neuropathy in both feet, has foot drop, wears a brace. 3 months ago she fell and broke both her wrists. Normal DEXA in 2018. 2 weeks prior to her fractures her husband had neck surgery.   On a statin secondary to cardiac risk, normal lipids. Abnormal cardiac calcium CT. She lost a lot of weight after gastric bypass.   Patient's last menstrual period was 10/22/2007.          Sexually active: Yes.    The current method of family planning is post menopausal status.    Exercising: No.  The patient does not participate in regular exercise at present. Smoker:  no  Health Maintenance: Pap: 07-16-17 neg HPV HR neg, 07-05-14 neg HPV HR neg  History of abnormal Pap:  no [facility]:  2020 at [facility] will get report, due in the fall.   BMD:   2018 normal Colonoscopy: 2020 normal  TDaP:  Unsure  Gardasil: NA   reports that she has never smoked. She has never used smokeless tobacco. She reports current alcohol use of about 1.0 - 2.0 standard drink of alcohol per week. She reports that she does not use drugs. Married x almost 40 years.   Past Medical History:  Diagnosis Date  . Anemia    hx of - during 1st round chemo  . Anxiety   . Arthritis    in fingers, shoulders  . Back pain   . Balance problem   . Breast cancer ([place])   . Breast cancer of upper-outer quadrant of right female breast ([place]) 07/22/2014  . Bunion, right foot   . Chronic kidney disease    creatininei levels high per pt   . Clumsiness   . Constipation   . Depression   . Diabetes mellitus without complication ([place])    69+ years type 2   . Diabetic retinopathy ([place])    torn retina  . Eating disorder     binge eating  . Epistaxis 08/26/2014  . Fatty liver   . HCAP (healthcare-associated pneumonia) 12/18/2014  . Heart murmur    never had any problems  . History of radiation therapy 04/05/15-05/19/15   right chest wall was treated to 50.4 Gy in 28 fractions, right mastectomy scar was treated to 10 Gy in 5 fractions  . Hyperlipidemia   . Hypertension   . Joint pain   . Multiple food allergies   . Neuromuscular disorder ([place])    diabetic neuropathy  . Obesity   . Obstructive sleep apnea on CPAP    does not use cpap all the time has lost 160 lbs   . Ovarian cyst rupture    possible  . Pneumonia   . Pneumonia 11/2014  . Radiation 04/05/15-05/19/15   right chest wall 50.4 Gy, mastectomy scar 10 Gy  . Sleep apnea   . Thyroid nodule 06/2014    Past Surgical History:  Procedure Laterality Date  . APPENDECTOMY    . BIOPSY THYROID Bilateral 06/2014   2 nodules - Benign   . BREAST LUMPECTOMY WITH RADIOACTIVE SEED AND SENTINEL LYMPH NODE BIOPSY Right 12/27/2014   Procedure: BREAST LUMPECTOMY WITH RADIOACTIVE SEED AND SENTINEL LYMPH NODE BIOPSY;  Surgeon: Stark Klein, MD;  Location: [facility];  Service: General;  Laterality: Right;  . BREAST REDUCTION SURGERY Bilateral 01/06/2015   Procedure: BILATERAL BREAST REDUCTION, RIGHT ONCOPLASTIC RECONSTRUCTION),LEFT BREAST REDUCTION FOR SYMMETRY;  Surgeon: [name], MD;  Location: [place];  Service: Plastics;  Laterality: Bilateral;  . BREATH TEK H PYLORI N/A 09/15/2012   Procedure: BREATH TEK H PYLORI;  Surgeon: Pedro Earls, MD;  Location: [facility];  Service: General;  Laterality: N/A;  . BUNIONECTOMY Left 12/2013  . CARPAL TUNNEL RELEASE Right   . CARPAL TUNNEL RELEASE Left   . CATARACT EXTRACTION     6/19  . COLONOSCOPY    . DUPUYTREN CONTRACTURE RELEASE    . GASTRIC ROUX-EN-Y N/A 11/02/2012   Procedure: LAPAROSCOPIC ROUX-EN-Y GASTRIC;  Surgeon: Pedro Earls, MD;  Location: [facility];  Service: General;  Laterality:  N/A;  . IR GENERIC HISTORICAL  03/18/2016   IR US GUIDE VASC ACCESS LEFT 03/18/2016 Markus Daft, MD [facility]  . IR GENERIC HISTORICAL  03/18/2016   IR FLUORO GUIDE CV LINE LEFT 03/18/2016 Markus Daft, MD [facility]  . LAPAROSCOPIC APPENDECTOMY N/A 07/22/2015   Procedure: APPENDECTOMY LAPAROSCOPIC;  Surgeon: Michael Boston, MD;  Location: [facility];  Service: General;  Laterality: N/A;  . MASTECTOMY Right 02/15/2015   modified  . MODIFIED MASTECTOMY Right 02/15/2015   Procedure: RIGHT MODIFIED RADICAL MASTECTOMY;  Surgeon: Stark Klein, MD;  Location: [facility];  Service: General;  Laterality: Right;  . ORIF WRIST FRACTURE Left 05/11/2019   Procedure: OPEN REDUCTION INTERNAL FIXATION (ORIF) left distal radius fracture;  Surgeon: Renette Butters, MD;  Location: [facility];  Service: Orthopedics;  Laterality: Left;  with block  . PORT-A-CATH REMOVAL Left 10/10/2015   Procedure: PORT REMOVAL;  Surgeon: Stark Klein, MD;  Location: [facility];  Service: General;  Laterality: Left;  . PORTACATH PLACEMENT Left 08/02/2014   Procedure: INSERTION PORT-A-CATH;  Surgeon: Stark Klein, MD;  Location: [facility];  Service: General;  Laterality: Left;  . PORTACATH PLACEMENT N/A 11/05/2016   Procedure: INSERTION PORT-A-CATH;  Surgeon: Stark Klein, MD;  Location: [facility];  Service: General;  Laterality: N/A;  . RETINAL LASER PROCEDURE     retina tear on left eye  . ROTATOR CUFF REPAIR Right 2005  . SCAR REVISION Right 12/08/2015   Procedure: COMPLEX REPAIR RIGHT CHEST 10-15CM;  Surgeon: [name], MD;  Location: [facility];  Service: Plastics;  Laterality: Right;  . TOE SURGERY  1970s   bone spur  . TRIGGER FINGER RELEASE     x3    Current Outpatient Medications  Medication Sig Dispense Refill  . Accu-Chek Softclix Lancets lancets 1 each by Other route 4 (four) times daily. Test blood sugars 4 x times daily 100 each 1  . aspirin EC 81 MG tablet Take 1 tablet (81 mg total) by mouth  daily. 90 tablet 3  . Bempedoic Acid (NEXLETOL) 180 MG TABS Take 180 mg by mouth daily. 30 tablet 6  . Blood Glucose Calibration (ACCU-CHEK GUIDE CONTROL) LIQD 1 Bottle by In Vitro route as needed. 1 each 0  . Blood Glucose Monitoring Suppl (ACCU-CHEK GUIDE) w/Device KIT 1 each by Does not apply route 4 (four) times daily. Use to test blood sugars 4x a day 1 kit 0  . buPROPion (WELLBUTRIN SR) 200 MG 12 hr tablet TAKE 1 TABLET AT 12 NOON. 90 tablet 0  . CALCIUM CITRATE-VITAMIN D PO Take 1 tablet by mouth  3 (three) times daily.     . carvedilol (COREG) 3.125 MG tablet TAKE 1 TABLET BY MOUTH TWICE DAILY. 180 tablet 3  . cholecalciferol (VITAMIN D) 1000 units tablet Take 1,000 Units by mouth daily.    . cholestyramine (QUESTRAN) 4 g packet Take 1 packet (4 g total) by mouth 2 (two) times daily as needed. 10 each 12  . Cyanocobalamin (VITAMIN B-12 PO) Take 1 tablet by mouth every 14 (fourteen) days.    . diphenhydrAMINE (BENADRYL) 25 MG tablet Take 25 mg by mouth daily as needed for allergies or sleep.     [name] glucose blood (ACCU-CHEK GUIDE) test strip 1 each by Other route 4 (four) times daily. Accu Chek Guide  Test blood sugars 4 times a day 100 each 1  . insulin glargine (LANTUS SOLOSTAR) 100 UNIT/ML Solostar Pen Inject 10 Units into the skin daily. 9 mL 0  . Insulin Pen Needle (SURE COMFORT PEN NEEDLES) 32G X 4 MM MISC USE WITH INJECTIONS 100 each 0  . liraglutide (VICTOZA) 18 MG/3ML SOPN INJECT 1.8MG (0.3ML) SUBCUTANEOUSLY EACH AM 9 mL 0  . loratadine (CLARITIN) 10 MG tablet Take 10 mg by mouth as needed for allergies.    [name] losartan (COZAAR) 100 MG tablet Take 1 tablet (100 mg total) by mouth daily. 90 tablet 0  . ondansetron (ZOFRAN) 4 MG tablet Take 1 tablet (4 mg total) by mouth every 8 (eight) hours as needed for nausea or vomiting. 20 tablet 0  . Pediatric Multivitamins-Iron (FLINTSTONES PLUS IRON) chewable tablet Chew 1 tablet by mouth 2 (two) times daily.    . Probiotic Product ([employer] PROBIOTIC  PO) Take 1 capsule by mouth daily.     . prochlorperazine (COMPAZINE) 10 MG tablet Take 1 tablet (10 mg total) by mouth every 6 (six) hours as needed for nausea or vomiting. 30 tablet 0  . Propylene Glycol (SYSTANE BALANCE OP) Place 1-2 drops into both eyes 3 (three) times daily as needed (for dry eyes).     [name] senna-docusate (SENNA PLUS) 8.6-50 MG tablet Take 1 tablet by mouth daily as needed for moderate constipation.     . topiramate (TOPAMAX) 25 MG tablet Take 1 tablet (25 mg total) by mouth daily. 90 tablet 0  . vortioxetine HBr (TRINTELLIX) 20 MG TABS tablet Take 1 tablet (20 mg total) by mouth at bedtime. 90 tablet 0   No current facility-administered medications for this visit.    Family History  Problem Relation Age of Onset  . CVA Mother   . Heart disease Mother   . Sudden death Mother   . Diabetes Father   . Heart failure Father   . Hypertension Father   . Kidney disease Father   . Obesity Father   . Hypertension Sister   . Diabetes Brother   . Breast cancer Paternal Grandmother 56  . Diabetes Paternal Uncle   . Ovarian cancer Neg Hx     Review of Systems  Genitourinary:       Vaginal dryness  All other systems reviewed and are negative.   Exam:   LMP 10/22/2007   Weight change: '@WEIGHTCHANGE' @ Height:      Ht Readings from Last 3 Encounters:  07/06/19 '5\' 9"'  (1.753 m)  07/01/19 '5\' 9"'  (1.753 m)  06/29/19 '5\' 9"'  (1.753 m)    General appearance: alert, cooperative and appears stated age Head: Normocephalic, without obvious abnormality, atraumatic Neck: no adenopathy, supple, symmetrical, trachea midline and thyroid normal to inspection and palpation Lungs:  clear to auscultation bilaterally Cardiovascular: regular rate and rhythm Breasts: normal appearance, no masses or tenderness, s/p right mastectomy, right chest wall without lumps.  Abdomen: soft, non-tender; non distended,  no masses,  no organomegaly Extremities: extremities normal, atraumatic, no cyanosis or  edema Skin: Skin color, texture, turgor normal. No rashes or lesions Lymph nodes: Cervical, supraclavicular, and axillary nodes normal. No abnormal inguinal nodes palpated Neurologic: Grossly normal   Pelvic: External genitalia:  no lesions              Urethra:  normal appearing urethra with no masses, tenderness or lesions              Bartholins and Skenes: normal                 Vagina: atrophic appearing vagina with normal color and discharge, no lesions              Cervix: no lesions               Bimanual Exam:  Uterus:  normal size, contour, position, consistency, mobility, non-tender              Adnexa: no mass, fullness, tenderness               Rectovaginal: Confirms               Anus:  normal sphincter tone, no lesions  Gae Dry chaperoned for the exam.  A:  Well Woman with normal exam  H/O breast cancer with liver mets, in remission  DM, well controlled  Cardiac issues followed by Cardiology  P:   No pap this year  Mammogram in the fall (will get a copy of her last one)  Labs with primary and cardiology  Discussed breast self exam  Discussed calcium and vit D intake  On supplements secondary to her gastric bypass (followed by [name])

## 2019-08-03 ENCOUNTER — Other Ambulatory Visit: Payer: Self-pay

## 2019-08-03 ENCOUNTER — Ambulatory Visit: Payer: Medicare PPO | Admitting: Adult Health

## 2019-08-03 ENCOUNTER — Inpatient Hospital Stay: Payer: Medicare PPO

## 2019-08-03 ENCOUNTER — Inpatient Hospital Stay: Payer: Medicare PPO | Attending: Oncology

## 2019-08-03 ENCOUNTER — Other Ambulatory Visit (INDEPENDENT_AMBULATORY_CARE_PROVIDER_SITE_OTHER): Payer: Self-pay | Admitting: Family Medicine

## 2019-08-03 VITALS — BP 131/63 | HR 61 | Temp 97.8°F | Resp 18 | Wt 182.2 lb

## 2019-08-03 DIAGNOSIS — C50411 Malignant neoplasm of upper-outer quadrant of right female breast: Secondary | ICD-10-CM | POA: Diagnosis not present

## 2019-08-03 DIAGNOSIS — C787 Secondary malignant neoplasm of liver and intrahepatic bile duct: Secondary | ICD-10-CM

## 2019-08-03 DIAGNOSIS — C50911 Malignant neoplasm of unspecified site of right female breast: Secondary | ICD-10-CM

## 2019-08-03 DIAGNOSIS — Z5112 Encounter for antineoplastic immunotherapy: Secondary | ICD-10-CM | POA: Diagnosis not present

## 2019-08-03 DIAGNOSIS — Z95828 Presence of other vascular implants and grafts: Secondary | ICD-10-CM

## 2019-08-03 DIAGNOSIS — Z171 Estrogen receptor negative status [ER-]: Secondary | ICD-10-CM

## 2019-08-03 DIAGNOSIS — Z794 Long term (current) use of insulin: Secondary | ICD-10-CM

## 2019-08-03 DIAGNOSIS — E1121 Type 2 diabetes mellitus with diabetic nephropathy: Secondary | ICD-10-CM

## 2019-08-03 DIAGNOSIS — C801 Malignant (primary) neoplasm, unspecified: Secondary | ICD-10-CM

## 2019-08-03 LAB — CBC WITH DIFFERENTIAL/PLATELET
Abs Immature Granulocytes: 0.02 K/uL (ref 0.00–0.07)
Basophils Absolute: 0 K/uL (ref 0.0–0.1)
Basophils Relative: 1 %
Eosinophils Absolute: 0.1 K/uL (ref 0.0–0.5)
Eosinophils Relative: 3 %
HCT: 31.7 % — ABNORMAL LOW (ref 36.0–46.0)
Hemoglobin: 10.4 g/dL — ABNORMAL LOW (ref 12.0–15.0)
Immature Granulocytes: 1 %
Lymphocytes Relative: 20 %
Lymphs Abs: 0.9 K/uL (ref 0.7–4.0)
MCH: 31.9 pg (ref 26.0–34.0)
MCHC: 32.8 g/dL (ref 30.0–36.0)
MCV: 97.2 fL (ref 80.0–100.0)
Monocytes Absolute: 0.3 K/uL (ref 0.1–1.0)
Monocytes Relative: 6 %
Neutro Abs: 3 K/uL (ref 1.7–7.7)
Neutrophils Relative %: 69 %
Platelets: 144 K/uL — ABNORMAL LOW (ref 150–400)
RBC: 3.26 MIL/uL — ABNORMAL LOW (ref 3.87–5.11)
RDW: 12.8 % (ref 11.5–15.5)
WBC: 4.3 K/uL (ref 4.0–10.5)
nRBC: 0 % (ref 0.0–0.2)

## 2019-08-03 LAB — COMPREHENSIVE METABOLIC PANEL
ALT: 68 U/L — ABNORMAL HIGH (ref 0–44)
AST: 45 U/L — ABNORMAL HIGH (ref 15–41)
Albumin: 3.5 g/dL (ref 3.5–5.0)
Alkaline Phosphatase: 105 U/L (ref 38–126)
Anion gap: 6 (ref 5–15)
BUN: 32 mg/dL — ABNORMAL HIGH (ref 8–23)
CO2: 21 mmol/L — ABNORMAL LOW (ref 22–32)
Calcium: 8.7 mg/dL — ABNORMAL LOW (ref 8.9–10.3)
Chloride: 115 mmol/L — ABNORMAL HIGH (ref 98–111)
Creatinine, Ser: 1.68 mg/dL — ABNORMAL HIGH (ref 0.44–1.00)
GFR calc Af Amer: 36 mL/min — ABNORMAL LOW (ref >60)
GFR calc non Af Amer: 31 mL/min — ABNORMAL LOW (ref >60)
Glucose, Bld: 134 mg/dL — ABNORMAL HIGH (ref 70–99)
Potassium: 4.2 mmol/L (ref 3.5–5.1)
Sodium: 142 mmol/L (ref 135–145)
Total Bilirubin: 0.6 mg/dL (ref 0.3–1.2)
Total Protein: 6.1 g/dL — ABNORMAL LOW (ref 6.5–8.1)

## 2019-08-03 MED ORDER — SODIUM CHLORIDE 0.9 % IV SOLN
420.0000 mg | Freq: Once | INTRAVENOUS | Status: AC
  Administered 2019-08-03: 420 mg via INTRAVENOUS
  Filled 2019-08-03: qty 14

## 2019-08-03 MED ORDER — ACETAMINOPHEN 325 MG PO TABS
650.0000 mg | ORAL_TABLET | Freq: Once | ORAL | Status: AC
  Administered 2019-08-03: 650 mg via ORAL

## 2019-08-03 MED ORDER — TRASTUZUMAB-ANNS CHEMO 150 MG IV SOLR
450.0000 mg | Freq: Once | INTRAVENOUS | Status: AC
  Administered 2019-08-03: 450 mg via INTRAVENOUS
  Filled 2019-08-03: qty 21.43

## 2019-08-03 MED ORDER — DIPHENHYDRAMINE HCL 25 MG PO CAPS
25.0000 mg | ORAL_CAPSULE | Freq: Once | ORAL | Status: AC
  Administered 2019-08-03: 25 mg via ORAL

## 2019-08-03 MED ORDER — DIPHENHYDRAMINE HCL 25 MG PO CAPS
ORAL_CAPSULE | ORAL | Status: AC
  Filled 2019-08-03: qty 2

## 2019-08-03 MED ORDER — ACETAMINOPHEN 325 MG PO TABS
ORAL_TABLET | ORAL | Status: AC
  Filled 2019-08-03: qty 2

## 2019-08-03 MED ORDER — SODIUM CHLORIDE 0.9 % IV SOLN
Freq: Once | INTRAVENOUS | Status: AC
  Filled 2019-08-03: qty 250

## 2019-08-03 MED ORDER — SODIUM CHLORIDE 0.9% FLUSH
10.0000 mL | INTRAVENOUS | Status: DC | PRN
  Administered 2019-08-03: 10 mL via INTRAVENOUS
  Filled 2019-08-03: qty 10

## 2019-08-03 MED ORDER — SODIUM CHLORIDE 0.9% FLUSH
10.0000 mL | INTRAVENOUS | Status: DC | PRN
  Administered 2019-08-03: 10 mL
  Filled 2019-08-03: qty 10

## 2019-08-03 MED ORDER — HEPARIN SOD (PORK) LOCK FLUSH 100 UNIT/ML IV SOLN
500.0000 [IU] | Freq: Once | INTRAVENOUS | Status: AC | PRN
  Administered 2019-08-03: 500 [IU]
  Filled 2019-08-03: qty 5

## 2019-08-03 NOTE — Patient Instructions (Signed)
[facility] Discharge Instructions for Patients Receiving Chemotherapy  Today you received the following chemotherapy agents: trastuzumab and pertuzumab.  To help prevent nausea and vomiting after your treatment, we encourage you to take your nausea medication as directed.   If you develop nausea and vomiting that is not controlled by your nausea medication, call the clinic.   BELOW ARE SYMPTOMS THAT SHOULD BE REPORTED IMMEDIATELY:  *FEVER GREATER THAN 100.5 F  *CHILLS WITH OR WITHOUT FEVER  NAUSEA AND VOMITING THAT IS NOT CONTROLLED WITH YOUR NAUSEA MEDICATION  *UNUSUAL SHORTNESS OF BREATH  *UNUSUAL BRUISING OR BLEEDING  TENDERNESS IN MOUTH AND THROAT WITH OR WITHOUT PRESENCE OF ULCERS  *URINARY PROBLEMS  *BOWEL PROBLEMS  UNUSUAL RASH Items with * indicate a potential emergency and should be followed up as soon as possible.  Feel free to call the clinic should you have any questions or concerns. The clinic phone number is [phone].  Please show the CHEMO ALERT CARD at check-in to the Emergency Department and triage nurse.   

## 2019-08-03 NOTE — Progress Notes (Signed)
Patient declined to stay for 30 minute observation period following perjeta.

## 2019-08-04 ENCOUNTER — Encounter: Payer: Self-pay | Admitting: Obstetrics and Gynecology

## 2019-08-04 ENCOUNTER — Ambulatory Visit (INDEPENDENT_AMBULATORY_CARE_PROVIDER_SITE_OTHER): Payer: Medicare PPO | Admitting: Obstetrics and Gynecology

## 2019-08-04 VITALS — BP 118/58 | HR 77 | Ht 68.75 in | Wt 179.0 lb

## 2019-08-04 DIAGNOSIS — R296 Repeated falls: Secondary | ICD-10-CM | POA: Diagnosis not present

## 2019-08-04 DIAGNOSIS — M25672 Stiffness of left ankle, not elsewhere classified: Secondary | ICD-10-CM | POA: Diagnosis not present

## 2019-08-04 DIAGNOSIS — Z01419 Encounter for gynecological examination (general) (routine) without abnormal findings: Secondary | ICD-10-CM | POA: Diagnosis not present

## 2019-08-04 DIAGNOSIS — Z853 Personal history of malignant neoplasm of breast: Secondary | ICD-10-CM

## 2019-08-04 DIAGNOSIS — M6259 Muscle wasting and atrophy, not elsewhere classified, multiple sites: Secondary | ICD-10-CM | POA: Diagnosis not present

## 2019-08-04 DIAGNOSIS — R269 Unspecified abnormalities of gait and mobility: Secondary | ICD-10-CM | POA: Diagnosis not present

## 2019-08-04 NOTE — Patient Instructions (Addendum)
Try Replens vaginal moisturizer.   EXERCISE AND DIET:  We recommended that you start or continue a regular exercise program for good health. Regular exercise means any activity that makes your heart beat faster and makes you sweat.  We recommend exercising at least 30 minutes per day at least 3 days a week, preferably 4 or 5.  We also recommend a diet low in fat and sugar.  Inactivity, poor dietary choices and obesity can cause diabetes, heart attack, stroke, and kidney damage, among others.    ALCOHOL AND SMOKING:  Women should limit their alcohol intake to no more than 7 drinks/beers/glasses of wine (combined, not each!) per week. Moderation of alcohol intake to this level decreases your risk of breast cancer and liver damage. And of course, no recreational drugs are part of a healthy lifestyle.  And absolutely no smoking or even second hand smoke. Most people know smoking can cause heart and lung diseases, but did you know it also contributes to weakening of your bones? Aging of your skin?  Yellowing of your teeth and nails?  CALCIUM AND VITAMIN D:  Adequate intake of calcium and Vitamin D are recommended.  The recommendations for exact amounts of these supplements seem to change often, but generally speaking 1,200 mg of calcium (between diet and supplement) and 800 units of Vitamin D per day seems prudent. Certain women may benefit from higher intake of Vitamin D.  If you are among these women, your doctor will have told you during your visit.    PAP SMEARS:  Pap smears, to check for cervical cancer or precancers,  have traditionally been done yearly, although recent scientific advances have shown that most women can have pap smears less often.  However, every woman still should have a physical exam from her gynecologist every year. It will include a breast check, inspection of the vulva and vagina to check for abnormal growths or skin changes, a visual exam of the cervix, and then an exam to evaluate  the size and shape of the uterus and ovaries.  And after 67 years of age, a rectal exam is indicated to check for rectal cancers. We will also provide age appropriate advice regarding health maintenance, like when you should have certain vaccines, screening for sexually transmitted diseases, bone density testing, colonoscopy, mammograms, etc.   MAMMOGRAMS:  All women over 79 years old should have a yearly mammogram. Many facilities now offer a "3D" mammogram, which may cost around $50 extra out of pocket. If possible,  we recommend you accept the option to have the 3D mammogram performed.  It both reduces the number of women who will be called back for extra views which then turn out to be normal, and it is better than the routine mammogram at detecting truly abnormal areas.    COLON CANCER SCREENING: Now recommend starting at age 26. At this time colonoscopy is not covered for routine screening until 50. There are take home tests that can be done between 45-49.   COLONOSCOPY:  Colonoscopy to screen for colon cancer is recommended for all women at age 42.  We know, you hate the idea of the prep.  We agree, BUT, having colon cancer and not knowing it is worse!!  Colon cancer so often starts as a polyp that can be seen and removed at colonscopy, which can quite literally save your life!  And if your first colonoscopy is normal and you have no family history of colon cancer, most women don't  have to have it again for 10 years.  Once every ten years, you can do something that may end up saving your life, right?  We will be happy to help you get it scheduled when you are ready.  Be sure to check your insurance coverage so you understand how much it will cost.  It may be covered as a preventative service at no cost, but you should check your particular policy.      Breast Self-Awareness Breast self-awareness means being familiar with how your breasts look and feel. It involves checking your breasts regularly  and reporting any changes to your health care provider. Practicing breast self-awareness is important. A change in your breasts can be a sign of a serious medical problem. Being familiar with how your breasts look and feel allows you to find any problems early, when treatment is more likely to be successful. All women should practice breast self-awareness, including women who have had breast implants. How to do a breast self-exam One way to learn what is normal for your breasts and whether your breasts are changing is to do a breast self-exam. To do a breast self-exam: Look for Changes  1. Remove all the clothing above your waist. 2. Stand in front of a mirror in a room with good lighting. 3. Put your hands on your hips. 4. Push your hands firmly downward. 5. Compare your breasts in the mirror. Look for differences between them (asymmetry), such as: ? Differences in shape. ? Differences in size. ? Puckers, dips, and bumps in one breast and not the other. 6. Look at each breast for changes in your skin, such as: ? Redness. ? Scaly areas. 7. Look for changes in your nipples, such as: ? Discharge. ? Bleeding. ? Dimpling. ? Redness. ? A change in position. Feel for Changes Carefully feel your breasts for lumps and changes. It is best to do this while lying on your back on the floor and again while sitting or standing in the shower or tub with soapy water on your skin. Feel each breast in the following way:  Place the arm on the side of the breast you are examining above your head.  Feel your breast with the other hand.  Start in the nipple area and make  inch (2 cm) overlapping circles to feel your breast. Use the pads of your three middle fingers to do this. Apply light pressure, then medium pressure, then firm pressure. The light pressure will allow you to feel the tissue closest to the skin. The medium pressure will allow you to feel the tissue that is a little deeper. The firm pressure  will allow you to feel the tissue close to the ribs.  Continue the overlapping circles, moving downward over the breast until you feel your ribs below your breast.  Move one finger-width toward the center of the body. Continue to use the  inch (2 cm) overlapping circles to feel your breast as you move slowly up toward your collarbone.  Continue the up and down exam using all three pressures until you reach your armpit.  Write Down What You Find  Write down what is normal for each breast and any changes that you find. Keep a written record with breast changes or normal findings for each breast. By writing this information down, you do not need to depend only on memory for size, tenderness, or location. Write down where you are in your menstrual cycle, if you are still menstruating. If  you are having trouble noticing differences in your breasts, do not get discouraged. With time you will become more familiar with the variations in your breasts and more comfortable with the exam. How often should I examine my breasts? Examine your breasts every month. If you are breastfeeding, the best time to examine your breasts is after a feeding or after using a breast pump. If you menstruate, the best time to examine your breasts is 5-7 days after your period is over. During your period, your breasts are lumpier, and it may be more difficult to notice changes. When should I see my health care provider? See your health care provider if you notice:  A change in shape or size of your breasts or nipples.  A change in the skin of your breast or nipples, such as a reddened or scaly area.  Unusual discharge from your nipples.  A lump or thick area that was not there before.  Pain in your breasts.  Anything that concerns you.

## 2019-08-06 DIAGNOSIS — R269 Unspecified abnormalities of gait and mobility: Secondary | ICD-10-CM | POA: Diagnosis not present

## 2019-08-06 DIAGNOSIS — M25672 Stiffness of left ankle, not elsewhere classified: Secondary | ICD-10-CM | POA: Diagnosis not present

## 2019-08-06 DIAGNOSIS — M6259 Muscle wasting and atrophy, not elsewhere classified, multiple sites: Secondary | ICD-10-CM | POA: Diagnosis not present

## 2019-08-06 DIAGNOSIS — R296 Repeated falls: Secondary | ICD-10-CM | POA: Diagnosis not present

## 2019-08-11 DIAGNOSIS — R296 Repeated falls: Secondary | ICD-10-CM | POA: Diagnosis not present

## 2019-08-11 DIAGNOSIS — M6259 Muscle wasting and atrophy, not elsewhere classified, multiple sites: Secondary | ICD-10-CM | POA: Diagnosis not present

## 2019-08-11 DIAGNOSIS — R269 Unspecified abnormalities of gait and mobility: Secondary | ICD-10-CM | POA: Diagnosis not present

## 2019-08-11 DIAGNOSIS — M25672 Stiffness of left ankle, not elsewhere classified: Secondary | ICD-10-CM | POA: Diagnosis not present

## 2019-08-12 ENCOUNTER — Encounter (HOSPITAL_COMMUNITY): Payer: Self-pay

## 2019-08-12 ENCOUNTER — Other Ambulatory Visit: Payer: Self-pay | Admitting: Pharmacist Clinician (PhC)/ Clinical Pharmacy Specialist

## 2019-08-12 DIAGNOSIS — E119 Type 2 diabetes mellitus without complications: Secondary | ICD-10-CM

## 2019-08-12 DIAGNOSIS — E785 Hyperlipidemia, unspecified: Secondary | ICD-10-CM

## 2019-08-12 DIAGNOSIS — C50411 Malignant neoplasm of upper-outer quadrant of right female breast: Secondary | ICD-10-CM

## 2019-08-12 DIAGNOSIS — C801 Malignant (primary) neoplasm, unspecified: Secondary | ICD-10-CM

## 2019-08-12 DIAGNOSIS — K760 Fatty (change of) liver, not elsewhere classified: Secondary | ICD-10-CM

## 2019-08-12 DIAGNOSIS — C787 Secondary malignant neoplasm of liver and intrahepatic bile duct: Secondary | ICD-10-CM

## 2019-08-12 DIAGNOSIS — D630 Anemia in neoplastic disease: Secondary | ICD-10-CM

## 2019-08-12 DIAGNOSIS — E559 Vitamin D deficiency, unspecified: Secondary | ICD-10-CM

## 2019-08-12 DIAGNOSIS — C50911 Malignant neoplasm of unspecified site of right female breast: Secondary | ICD-10-CM

## 2019-08-12 MED ORDER — NEXLETOL 180 MG PO TABS: 180.0000 mg | ORAL_TABLET | Freq: Every day | ORAL | 3 refills | Status: DC

## 2019-08-13 DIAGNOSIS — R296 Repeated falls: Secondary | ICD-10-CM | POA: Diagnosis not present

## 2019-08-13 DIAGNOSIS — M6259 Muscle wasting and atrophy, not elsewhere classified, multiple sites: Secondary | ICD-10-CM | POA: Diagnosis not present

## 2019-08-13 DIAGNOSIS — R269 Unspecified abnormalities of gait and mobility: Secondary | ICD-10-CM | POA: Diagnosis not present

## 2019-08-13 DIAGNOSIS — M25672 Stiffness of left ankle, not elsewhere classified: Secondary | ICD-10-CM | POA: Diagnosis not present

## 2019-08-16 ENCOUNTER — Other Ambulatory Visit: Payer: Self-pay

## 2019-08-16 ENCOUNTER — Other Ambulatory Visit: Payer: Self-pay | Admitting: Pharmacist Clinician (PhC)/ Clinical Pharmacy Specialist

## 2019-08-16 MED ORDER — NEXLIZET 180-10 MG PO TABS: 1.0000 | ORAL_TABLET | Freq: Every day | ORAL | 3 refills | Status: DC

## 2019-08-17 DIAGNOSIS — R269 Unspecified abnormalities of gait and mobility: Secondary | ICD-10-CM | POA: Diagnosis not present

## 2019-08-17 DIAGNOSIS — R296 Repeated falls: Secondary | ICD-10-CM | POA: Diagnosis not present

## 2019-08-17 DIAGNOSIS — M25672 Stiffness of left ankle, not elsewhere classified: Secondary | ICD-10-CM | POA: Diagnosis not present

## 2019-08-17 DIAGNOSIS — M6259 Muscle wasting and atrophy, not elsewhere classified, multiple sites: Secondary | ICD-10-CM | POA: Diagnosis not present

## 2019-08-18 DIAGNOSIS — H43811 Vitreous degeneration, right eye: Secondary | ICD-10-CM | POA: Diagnosis not present

## 2019-08-18 DIAGNOSIS — H3582 Retinal ischemia: Secondary | ICD-10-CM | POA: Diagnosis not present

## 2019-08-18 DIAGNOSIS — H35371 Puckering of macula, right eye: Secondary | ICD-10-CM | POA: Diagnosis not present

## 2019-08-18 DIAGNOSIS — E113513 Type 2 diabetes mellitus with proliferative diabetic retinopathy with macular edema, bilateral: Secondary | ICD-10-CM | POA: Diagnosis not present

## 2019-08-19 ENCOUNTER — Encounter (INDEPENDENT_AMBULATORY_CARE_PROVIDER_SITE_OTHER): Payer: Self-pay | Admitting: Family Medicine

## 2019-08-19 ENCOUNTER — Encounter: Payer: Self-pay | Admitting: Podiatry

## 2019-08-20 DIAGNOSIS — M25672 Stiffness of left ankle, not elsewhere classified: Secondary | ICD-10-CM | POA: Diagnosis not present

## 2019-08-20 DIAGNOSIS — R296 Repeated falls: Secondary | ICD-10-CM | POA: Diagnosis not present

## 2019-08-20 DIAGNOSIS — R269 Unspecified abnormalities of gait and mobility: Secondary | ICD-10-CM | POA: Diagnosis not present

## 2019-08-20 DIAGNOSIS — M6259 Muscle wasting and atrophy, not elsewhere classified, multiple sites: Secondary | ICD-10-CM | POA: Diagnosis not present

## 2019-08-23 ENCOUNTER — Encounter: Payer: Self-pay | Admitting: Podiatry

## 2019-08-23 NOTE — Telephone Encounter (Signed)
Called pt and explained that we do not send paperwork until pt has came in and picked out shoes and been measured so that the paperwork matches with the order.She is scheduled to see Liliane Channel 8.9.2021.[name] @ 3

## 2019-08-25 DIAGNOSIS — M6259 Muscle wasting and atrophy, not elsewhere classified, multiple sites: Secondary | ICD-10-CM | POA: Diagnosis not present

## 2019-08-25 DIAGNOSIS — R296 Repeated falls: Secondary | ICD-10-CM | POA: Diagnosis not present

## 2019-08-25 DIAGNOSIS — M25672 Stiffness of left ankle, not elsewhere classified: Secondary | ICD-10-CM | POA: Diagnosis not present

## 2019-08-25 DIAGNOSIS — R269 Unspecified abnormalities of gait and mobility: Secondary | ICD-10-CM | POA: Diagnosis not present

## 2019-08-27 DIAGNOSIS — R296 Repeated falls: Secondary | ICD-10-CM | POA: Diagnosis not present

## 2019-08-27 DIAGNOSIS — M6259 Muscle wasting and atrophy, not elsewhere classified, multiple sites: Secondary | ICD-10-CM | POA: Diagnosis not present

## 2019-08-27 DIAGNOSIS — M25672 Stiffness of left ankle, not elsewhere classified: Secondary | ICD-10-CM | POA: Diagnosis not present

## 2019-08-27 DIAGNOSIS — R269 Unspecified abnormalities of gait and mobility: Secondary | ICD-10-CM | POA: Diagnosis not present

## 2019-08-30 ENCOUNTER — Ambulatory Visit: Payer: Medicare PPO | Admitting: Orthotics

## 2019-08-30 ENCOUNTER — Encounter: Payer: Self-pay | Admitting: Oncology

## 2019-08-30 ENCOUNTER — Other Ambulatory Visit: Payer: Self-pay

## 2019-08-30 DIAGNOSIS — E1142 Type 2 diabetes mellitus with diabetic polyneuropathy: Secondary | ICD-10-CM

## 2019-08-30 DIAGNOSIS — M21371 Foot drop, right foot: Secondary | ICD-10-CM

## 2019-08-30 DIAGNOSIS — M21372 Foot drop, left foot: Secondary | ICD-10-CM

## 2019-08-30 DIAGNOSIS — M2041 Other hammer toe(s) (acquired), right foot: Secondary | ICD-10-CM

## 2019-08-30 NOTE — Progress Notes (Signed)
Patient fitted today for DBS/OTS inserts due to artiuclated afo LEFT.

## 2019-08-31 ENCOUNTER — Other Ambulatory Visit: Payer: Self-pay

## 2019-08-31 ENCOUNTER — Inpatient Hospital Stay: Payer: Medicare PPO

## 2019-08-31 ENCOUNTER — Inpatient Hospital Stay: Payer: Medicare PPO | Attending: Oncology

## 2019-08-31 ENCOUNTER — Ambulatory Visit: Payer: Medicare PPO | Admitting: Oncology

## 2019-08-31 VITALS — BP 120/79 | HR 71 | Temp 98.2°F | Resp 18 | Wt 175.2 lb

## 2019-08-31 DIAGNOSIS — Z5112 Encounter for antineoplastic immunotherapy: Secondary | ICD-10-CM | POA: Insufficient documentation

## 2019-08-31 DIAGNOSIS — C50411 Malignant neoplasm of upper-outer quadrant of right female breast: Secondary | ICD-10-CM

## 2019-08-31 DIAGNOSIS — C787 Secondary malignant neoplasm of liver and intrahepatic bile duct: Secondary | ICD-10-CM

## 2019-08-31 DIAGNOSIS — Z95828 Presence of other vascular implants and grafts: Secondary | ICD-10-CM

## 2019-08-31 DIAGNOSIS — Z171 Estrogen receptor negative status [ER-]: Secondary | ICD-10-CM

## 2019-08-31 DIAGNOSIS — C50911 Malignant neoplasm of unspecified site of right female breast: Secondary | ICD-10-CM

## 2019-08-31 DIAGNOSIS — C801 Malignant (primary) neoplasm, unspecified: Secondary | ICD-10-CM

## 2019-08-31 LAB — COMPREHENSIVE METABOLIC PANEL
ALT: 10 U/L (ref 0–44)
AST: 22 U/L (ref 15–41)
Albumin: 3.8 g/dL (ref 3.5–5.0)
Alkaline Phosphatase: 68 U/L (ref 38–126)
Anion gap: 6 (ref 5–15)
BUN: 31 mg/dL — ABNORMAL HIGH (ref 8–23)
CO2: 23 mmol/L (ref 22–32)
Calcium: 9.3 mg/dL (ref 8.9–10.3)
Chloride: 113 mmol/L — ABNORMAL HIGH (ref 98–111)
Creatinine, Ser: 1.75 mg/dL — ABNORMAL HIGH (ref 0.44–1.00)
GFR calc Af Amer: 35 mL/min — ABNORMAL LOW (ref >60)
GFR calc non Af Amer: 30 mL/min — ABNORMAL LOW (ref >60)
Glucose, Bld: 89 mg/dL (ref 70–99)
Potassium: 4.3 mmol/L (ref 3.5–5.1)
Sodium: 142 mmol/L (ref 135–145)
Total Bilirubin: 0.5 mg/dL (ref 0.3–1.2)
Total Protein: 6.3 g/dL — ABNORMAL LOW (ref 6.5–8.1)

## 2019-08-31 LAB — CBC WITH DIFFERENTIAL/PLATELET
Abs Immature Granulocytes: 0.02 10*3/uL (ref 0.00–0.07)
Basophils Absolute: 0 10*3/uL (ref 0.0–0.1)
Basophils Relative: 0 %
Eosinophils Absolute: 0.1 10*3/uL (ref 0.0–0.5)
Eosinophils Relative: 2 %
HCT: 30.3 % — ABNORMAL LOW (ref 36.0–46.0)
Hemoglobin: 10.3 g/dL — ABNORMAL LOW (ref 12.0–15.0)
Immature Granulocytes: 0 %
Lymphocytes Relative: 19 %
Lymphs Abs: 1 10*3/uL (ref 0.7–4.0)
MCH: 31.7 pg (ref 26.0–34.0)
MCHC: 34 g/dL (ref 30.0–36.0)
MCV: 93.2 fL (ref 80.0–100.0)
Monocytes Absolute: 0.3 10*3/uL (ref 0.1–1.0)
Monocytes Relative: 6 %
Neutro Abs: 3.7 10*3/uL (ref 1.7–7.7)
Neutrophils Relative %: 73 %
Platelets: 150 10*3/uL (ref 150–400)
RBC: 3.25 MIL/uL — ABNORMAL LOW (ref 3.87–5.11)
RDW: 12.2 % (ref 11.5–15.5)
WBC: 5.2 10*3/uL (ref 4.0–10.5)
nRBC: 0 % (ref 0.0–0.2)

## 2019-08-31 LAB — CEA (IN HOUSE-CHCC): CEA (CHCC-In House): 1.16 ng/mL (ref 0.00–5.00)

## 2019-08-31 MED ORDER — DIPHENHYDRAMINE HCL 25 MG PO CAPS
25.0000 mg | ORAL_CAPSULE | Freq: Once | ORAL | Status: AC
  Administered 2019-08-31: 25 mg via ORAL

## 2019-08-31 MED ORDER — SODIUM CHLORIDE 0.9 % IV SOLN
Freq: Once | INTRAVENOUS | Status: AC
  Filled 2019-08-31: qty 250

## 2019-08-31 MED ORDER — ACETAMINOPHEN 325 MG PO TABS
650.0000 mg | ORAL_TABLET | Freq: Once | ORAL | Status: AC
  Administered 2019-08-31: 650 mg via ORAL

## 2019-08-31 MED ORDER — HEPARIN SOD (PORK) LOCK FLUSH 100 UNIT/ML IV SOLN
500.0000 [IU] | Freq: Once | INTRAVENOUS | Status: AC | PRN
  Administered 2019-08-31: 500 [IU]
  Filled 2019-08-31: qty 5

## 2019-08-31 MED ORDER — TRASTUZUMAB-ANNS CHEMO 420 MG IV SOLR
450.0000 mg | Freq: Once | INTRAVENOUS | Status: AC
  Administered 2019-08-31: 450 mg via INTRAVENOUS
  Filled 2019-08-31: qty 21.43

## 2019-08-31 MED ORDER — ACETAMINOPHEN 325 MG PO TABS
ORAL_TABLET | ORAL | Status: AC
  Filled 2019-08-31: qty 2

## 2019-08-31 MED ORDER — SODIUM CHLORIDE 0.9% FLUSH
10.0000 mL | INTRAVENOUS | Status: DC | PRN
  Administered 2019-08-31: 10 mL
  Filled 2019-08-31: qty 10

## 2019-08-31 MED ORDER — SODIUM CHLORIDE 0.9 % IV SOLN
420.0000 mg | Freq: Once | INTRAVENOUS | Status: AC
  Administered 2019-08-31: 420 mg via INTRAVENOUS
  Filled 2019-08-31: qty 14

## 2019-08-31 MED ORDER — DIPHENHYDRAMINE HCL 25 MG PO CAPS
ORAL_CAPSULE | ORAL | Status: AC
  Filled 2019-08-31: qty 1

## 2019-08-31 MED ORDER — SODIUM CHLORIDE 0.9% FLUSH
10.0000 mL | INTRAVENOUS | Status: DC | PRN
  Administered 2019-08-31: 10 mL via INTRAVENOUS
  Filled 2019-08-31: qty 10

## 2019-08-31 NOTE — Patient Instructions (Signed)
[facility] Discharge Instructions for Patients Receiving Chemotherapy  Today you received the following chemotherapy agents Trastuzumab (KANJINTI) & Pertuzumab (PERJETA).  To help prevent nausea and vomiting after your treatment, we encourage you to take your nausea medication as prescribed.   If you develop nausea and vomiting that is not controlled by your nausea medication, call the clinic.   BELOW ARE SYMPTOMS THAT SHOULD BE REPORTED IMMEDIATELY:  *FEVER GREATER THAN 100.5 F  *CHILLS WITH OR WITHOUT FEVER  NAUSEA AND VOMITING THAT IS NOT CONTROLLED WITH YOUR NAUSEA MEDICATION  *UNUSUAL SHORTNESS OF BREATH  *UNUSUAL BRUISING OR BLEEDING  TENDERNESS IN MOUTH AND THROAT WITH OR WITHOUT PRESENCE OF ULCERS  *URINARY PROBLEMS  *BOWEL PROBLEMS  UNUSUAL RASH Items with * indicate a potential emergency and should be followed up as soon as possible.  Feel free to call the clinic should you have any questions or concerns. The clinic phone number is [phone].  Please show the [facility] at check-in to the Emergency Department and triage nurse.

## 2019-09-01 DIAGNOSIS — R269 Unspecified abnormalities of gait and mobility: Secondary | ICD-10-CM | POA: Diagnosis not present

## 2019-09-01 DIAGNOSIS — M25672 Stiffness of left ankle, not elsewhere classified: Secondary | ICD-10-CM | POA: Diagnosis not present

## 2019-09-01 DIAGNOSIS — M6259 Muscle wasting and atrophy, not elsewhere classified, multiple sites: Secondary | ICD-10-CM | POA: Diagnosis not present

## 2019-09-01 DIAGNOSIS — R296 Repeated falls: Secondary | ICD-10-CM | POA: Diagnosis not present

## 2019-09-03 DIAGNOSIS — M6259 Muscle wasting and atrophy, not elsewhere classified, multiple sites: Secondary | ICD-10-CM | POA: Diagnosis not present

## 2019-09-03 DIAGNOSIS — M25672 Stiffness of left ankle, not elsewhere classified: Secondary | ICD-10-CM | POA: Diagnosis not present

## 2019-09-03 DIAGNOSIS — R269 Unspecified abnormalities of gait and mobility: Secondary | ICD-10-CM | POA: Diagnosis not present

## 2019-09-03 DIAGNOSIS — R296 Repeated falls: Secondary | ICD-10-CM | POA: Diagnosis not present

## 2019-09-07 ENCOUNTER — Encounter: Payer: Self-pay | Admitting: Oncology

## 2019-09-08 DIAGNOSIS — M6259 Muscle wasting and atrophy, not elsewhere classified, multiple sites: Secondary | ICD-10-CM | POA: Diagnosis not present

## 2019-09-08 DIAGNOSIS — R269 Unspecified abnormalities of gait and mobility: Secondary | ICD-10-CM | POA: Diagnosis not present

## 2019-09-08 DIAGNOSIS — R296 Repeated falls: Secondary | ICD-10-CM | POA: Diagnosis not present

## 2019-09-08 DIAGNOSIS — M25672 Stiffness of left ankle, not elsewhere classified: Secondary | ICD-10-CM | POA: Diagnosis not present

## 2019-09-10 DIAGNOSIS — R296 Repeated falls: Secondary | ICD-10-CM | POA: Diagnosis not present

## 2019-09-10 DIAGNOSIS — M6259 Muscle wasting and atrophy, not elsewhere classified, multiple sites: Secondary | ICD-10-CM | POA: Diagnosis not present

## 2019-09-10 DIAGNOSIS — R269 Unspecified abnormalities of gait and mobility: Secondary | ICD-10-CM | POA: Diagnosis not present

## 2019-09-10 DIAGNOSIS — M25672 Stiffness of left ankle, not elsewhere classified: Secondary | ICD-10-CM | POA: Diagnosis not present

## 2019-09-11 ENCOUNTER — Other Ambulatory Visit: Payer: Self-pay | Admitting: Oncology

## 2019-09-11 NOTE — Progress Notes (Signed)
Darlene [name] is scheduled to see me September 7 but I will be out of town at that time.  She has  an MRI of the liver scheduled for August 30.  I will call her with those results and if there is any evidence of progression we will switch her back to [place].  Because of this question I am not sick rescheduling her with an APP but with my partner Dr. Lindi Adie who will be able to address any questions if any arise.

## 2019-09-13 ENCOUNTER — Telehealth: Payer: Self-pay | Admitting: Oncology

## 2019-09-13 DIAGNOSIS — M6259 Muscle wasting and atrophy, not elsewhere classified, multiple sites: Secondary | ICD-10-CM | POA: Diagnosis not present

## 2019-09-13 DIAGNOSIS — M25672 Stiffness of left ankle, not elsewhere classified: Secondary | ICD-10-CM | POA: Diagnosis not present

## 2019-09-13 DIAGNOSIS — R269 Unspecified abnormalities of gait and mobility: Secondary | ICD-10-CM | POA: Diagnosis not present

## 2019-09-13 DIAGNOSIS — R296 Repeated falls: Secondary | ICD-10-CM | POA: Diagnosis not present

## 2019-09-13 NOTE — Telephone Encounter (Signed)
R./s appt per 8/21 sch msg  - pt is aware of new appt date and time

## 2019-09-15 DIAGNOSIS — R269 Unspecified abnormalities of gait and mobility: Secondary | ICD-10-CM | POA: Diagnosis not present

## 2019-09-15 DIAGNOSIS — M6259 Muscle wasting and atrophy, not elsewhere classified, multiple sites: Secondary | ICD-10-CM | POA: Diagnosis not present

## 2019-09-15 DIAGNOSIS — M25672 Stiffness of left ankle, not elsewhere classified: Secondary | ICD-10-CM | POA: Diagnosis not present

## 2019-09-15 DIAGNOSIS — R296 Repeated falls: Secondary | ICD-10-CM | POA: Diagnosis not present

## 2019-09-20 ENCOUNTER — Other Ambulatory Visit: Payer: Self-pay

## 2019-09-20 ENCOUNTER — Ambulatory Visit (HOSPITAL_COMMUNITY)
Admission: RE | Admit: 2019-09-20 | Discharge: 2019-09-20 | Disposition: A | Discharge status: Home / Self Care | Payer: Medicare PPO | Source: Ambulatory Visit | Attending: Oncology | Admitting: Oncology

## 2019-09-20 ENCOUNTER — Encounter: Payer: Self-pay | Admitting: Oncology

## 2019-09-20 DIAGNOSIS — D630 Anemia in neoplastic disease: Secondary | ICD-10-CM | POA: Insufficient documentation

## 2019-09-20 DIAGNOSIS — K7689 Other specified diseases of liver: Secondary | ICD-10-CM | POA: Diagnosis not present

## 2019-09-20 DIAGNOSIS — E559 Vitamin D deficiency, unspecified: Secondary | ICD-10-CM | POA: Insufficient documentation

## 2019-09-20 DIAGNOSIS — C801 Malignant (primary) neoplasm, unspecified: Secondary | ICD-10-CM | POA: Insufficient documentation

## 2019-09-20 DIAGNOSIS — E119 Type 2 diabetes mellitus without complications: Secondary | ICD-10-CM | POA: Diagnosis not present

## 2019-09-20 DIAGNOSIS — K8020 Calculus of gallbladder without cholecystitis without obstruction: Secondary | ICD-10-CM | POA: Diagnosis not present

## 2019-09-20 DIAGNOSIS — K760 Fatty (change of) liver, not elsewhere classified: Secondary | ICD-10-CM | POA: Diagnosis not present

## 2019-09-20 DIAGNOSIS — C50411 Malignant neoplasm of upper-outer quadrant of right female breast: Secondary | ICD-10-CM | POA: Insufficient documentation

## 2019-09-20 DIAGNOSIS — C787 Secondary malignant neoplasm of liver and intrahepatic bile duct: Secondary | ICD-10-CM | POA: Insufficient documentation

## 2019-09-20 DIAGNOSIS — C50911 Malignant neoplasm of unspecified site of right female breast: Secondary | ICD-10-CM | POA: Diagnosis not present

## 2019-09-20 DIAGNOSIS — C50919 Malignant neoplasm of unspecified site of unspecified female breast: Secondary | ICD-10-CM | POA: Diagnosis not present

## 2019-09-20 MED ORDER — GADOBUTROL 1 MMOL/ML IV SOLN
8.0000 mL | Freq: Once | INTRAVENOUS | Status: AC | PRN
  Administered 2019-09-20: 8 mL via INTRAVENOUS

## 2019-09-21 DIAGNOSIS — M25672 Stiffness of left ankle, not elsewhere classified: Secondary | ICD-10-CM | POA: Diagnosis not present

## 2019-09-21 DIAGNOSIS — R296 Repeated falls: Secondary | ICD-10-CM | POA: Diagnosis not present

## 2019-09-21 DIAGNOSIS — M6259 Muscle wasting and atrophy, not elsewhere classified, multiple sites: Secondary | ICD-10-CM | POA: Diagnosis not present

## 2019-09-21 DIAGNOSIS — R269 Unspecified abnormalities of gait and mobility: Secondary | ICD-10-CM | POA: Diagnosis not present

## 2019-09-22 ENCOUNTER — Encounter: Payer: Self-pay | Admitting: Neurology

## 2019-09-22 DIAGNOSIS — Z794 Long term (current) use of insulin: Secondary | ICD-10-CM

## 2019-09-22 DIAGNOSIS — G4733 Obstructive sleep apnea (adult) (pediatric): Secondary | ICD-10-CM

## 2019-09-22 DIAGNOSIS — Z9884 Bariatric surgery status: Secondary | ICD-10-CM

## 2019-09-22 DIAGNOSIS — E1140 Type 2 diabetes mellitus with diabetic neuropathy, unspecified: Secondary | ICD-10-CM

## 2019-09-22 DIAGNOSIS — C50911 Malignant neoplasm of unspecified site of right female breast: Secondary | ICD-10-CM

## 2019-09-22 DIAGNOSIS — Z9989 Dependence on other enabling machines and devices: Secondary | ICD-10-CM

## 2019-09-22 NOTE — Telephone Encounter (Signed)
Lets involve the DME and find a better fitting mask- I will send an order. If you are deemed non-compliant , we would need to establish a new level for apnea and could do this by HST.  I can go ahead and order this anyway. Lets postpone your visit by a month or two.   Larey Seat, MD    I placed a HST order already.

## 2019-09-22 NOTE — Telephone Encounter (Signed)
Lets involve the DME and find a better fitting mask- I will send an order. If you are deemed non-compliant , we would need to establish a new level for apnea and could do this by HST.  I can go ahead and order this anyway. Lets postpone your visit by a month or two.   Larey Seat, MD

## 2019-09-23 ENCOUNTER — Other Ambulatory Visit (INDEPENDENT_AMBULATORY_CARE_PROVIDER_SITE_OTHER): Payer: Self-pay | Admitting: Family Medicine

## 2019-09-23 DIAGNOSIS — R269 Unspecified abnormalities of gait and mobility: Secondary | ICD-10-CM | POA: Diagnosis not present

## 2019-09-23 DIAGNOSIS — M6259 Muscle wasting and atrophy, not elsewhere classified, multiple sites: Secondary | ICD-10-CM | POA: Diagnosis not present

## 2019-09-23 DIAGNOSIS — M25672 Stiffness of left ankle, not elsewhere classified: Secondary | ICD-10-CM | POA: Diagnosis not present

## 2019-09-23 DIAGNOSIS — R296 Repeated falls: Secondary | ICD-10-CM | POA: Diagnosis not present

## 2019-09-23 NOTE — Telephone Encounter (Signed)
Refill protocol sent to [name]

## 2019-09-27 NOTE — Progress Notes (Signed)
Patient Care Team: Vernie Shanks, MD as PCP - General (Family Medicine) Larey Dresser, MD as PCP - Advanced Heart Failure (Cardiology) Stark Klein, MD as Consulting Physician (General Surgery) Magrinat, Virgie Dad, MD as Consulting Physician (Oncology) Jacelyn Pi, MD as Consulting Physician (Endocrinology) [name], MD as Consulting Physician (Plastic Surgery) Gery Pray, MD as Consulting Physician (Radiation Oncology) Juanita Craver, MD as Consulting Physician (Gastroenterology) Alda Berthold, DO as Consulting Physician (Neurology) Starlyn Skeans, MD as Consulting Physician (Family Medicine) Sherlynn Stalls, MD as Consulting Physician (Ophthalmology) Salvadore Dom, MD as Consulting Physician (Obstetrics and Gynecology)  DIAGNOSIS:    ICD-10-CM   1. Malignant neoplasm of upper-outer quadrant of right female breast, unspecified estrogen receptor status ([place])  C50.411     SUMMARY OF ONCOLOGIC HISTORY: Oncology History  Breast cancer of upper-outer quadrant of right female breast ([place])  07/19/2014 Initial Biopsy   (R) breast needle biopsy (10:00): IDC, DCIS, grade 2. ER-, PR-, HER2+ (ratio 2.42). Ki67 90%.    07/22/2014 Initial Diagnosis   Breast cancer of upper-outer quadrant of right female breast   08/08/2014 - 11/21/2014 Neo-Adjuvant Chemotherapy   Taxotere/Carbo/Herceptin/Perjeta x 6 cycles.    12/12/2014 - 10/02/2015 Chemotherapy   Maintenance Herceptin (to complete 1 year of therapy).    12/27/2014 Surgery   (R) lumpectomy with SLNB [facility]): IDC, grade 2, spanning 4 cm, associated high grade DCIS. Negative margins. 1/3 SLN (+).   ER/PR repeated and remain negative.   ypT2, ypN1a: Stage IIB   01/06/2015 Surgery   Bilateral breast mammoplasty ([place]): Right breast path with intralymphatic emboli of ductal carcinoma. Left breast benign.    02/02/2015 Imaging   CT chest: No findings of metastatic disease in the chest. Right axillary fluid  density lesion likely postoperative seroma or hematoma. Small left subpectoral nodes warrant followup attention.   02/02/2015 Imaging   Bone scan: No scintigraphic evidence of osseous metastatic disease   02/16/2015 Surgery   (R) mastectomy and ALND [facility]): Scattered foci of high grade DCIS, scattered intralymphatic tumor emboli. Margins negative. 0/15 LNs.    04/05/2015 - 05/19/2015 Radiation Therapy   Adjuvant XRT ([place]). The right chest wall was treated to 50.4 Gy in 28 fractions at 1.8 Gy per fraction.  The right mastectomy scar was treated to 10 Gy in 5 fractions at 2 Gy per fraction.    12/06/2015 PET scan   Right subpectoral lymph node measuring 9 mm and exhibits malignant range FDG uptake, suspicious for metastatic adenopathy. Subcutaneous nodule within the upper left chest wall exhibits malignant range uptake, which is indeterminate; this is at the site of previous Port-A-Cath and is favored to represent postsurgical change. Indeterminate left adrenal nodules which exhibit mild uptake, favored to represent benign adenoma.   08/11/2017 - 11/03/2017 Chemotherapy   The patient had ado-trastuzumab emtansine (KADCYLA) 320 mg in sodium chloride 0.9 % 250 mL chemo infusion, 3.6 mg/kg = 320 mg, Intravenous, Once, 4 of 5 cycles Administration: 320 mg (08/12/2017), 320 mg (09/01/2017), 320 mg (09/23/2017)  for chemotherapy treatment.    11/18/2017 -  Chemotherapy   The patient had trastuzumab (HERCEPTIN) 546 mg in sodium chloride 0.9 % 250 mL chemo infusion, 6 mg/kg = 546 mg (100 % of original dose 6 mg/kg), Intravenous,  Once, 2 of 2 cycles Dose modification: 6 mg/kg (original dose 6 mg/kg, Cycle 1, Reason: Provider Judgment) Administration: 546 mg (11/18/2017), 546 mg (12/16/2017) pertuzumab (PERJETA) 420 mg in sodium chloride 0.9 % 250 mL chemo infusion,  420 mg (100 % of original dose 420 mg), Intravenous, Once, 14 of 15 cycles Dose modification: 420 mg (original dose 420 mg, Cycle 18), 420 mg  (original dose 420 mg, Cycle 27) Administration: 420 mg (10/06/2018), 420 mg (10/27/2018), 420 mg (11/18/2018), 420 mg (12/08/2018), 420 mg (12/29/2018), 420 mg (01/19/2019), 420 mg (02/16/2019), 420 mg (03/16/2019), 420 mg (04/13/2019), 420 mg (06/08/2019), 420 mg (07/06/2019), 420 mg (08/03/2019), 420 mg (08/31/2019), 420 mg (05/19/2019) trastuzumab-anns (KANJINTI) 546 mg in sodium chloride 0.9 % 250 mL chemo infusion, 6 mg/kg = 546 mg (100 % of original dose 6 mg/kg), Intravenous,  Once, 29 of 30 cycles Dose modification: 6 mg/kg (original dose 6 mg/kg, Cycle 3, Reason: Other (see comments), Comment: requested step therapy by [facility] insurance), 4 mg/kg (original dose 6 mg/kg, Cycle 9, Reason: Provider Judgment), 6 mg/kg (original dose 6 mg/kg, Cycle 17, Reason: Provider Judgment), 4 mg/kg (original dose 6 mg/kg, Cycle 27, Reason: Provider Judgment), 6 mg/kg (original dose 6 mg/kg, Cycle 27, Reason: Provider Judgment) Administration: 546 mg (01/13/2018), 546 mg (02/10/2018), 546 mg (03/03/2018), 546 mg (03/24/2018), 546 mg (04/14/2018), 546 mg (05/05/2018), 357 mg (05/26/2018), 357 mg (06/09/2018), 357 mg (06/23/2018), 357 mg (07/07/2018), 357 mg (07/21/2018), 357 mg (08/04/2018), 357 mg (08/18/2018), 357 mg (09/01/2018), 546 mg (09/15/2018), 546 mg (10/06/2018), 546 mg (10/27/2018), 546 mg (11/18/2018), 546 mg (12/08/2018), 546 mg (12/29/2018), 546 mg (01/19/2019), 546 mg (02/16/2019), 480 mg (03/16/2019), 483 mg (04/13/2019), 450 mg (06/08/2019), 450 mg (07/06/2019), 450 mg (08/03/2019), 450 mg (08/31/2019)  for chemotherapy treatment.    12/31/2017 - 09/14/2018 Chemotherapy   The patient had PACLitaxel-protein bound (ABRAXANE) chemo infusion 175 mg, 80 mg/m2 = 175 mg (100 % of original dose 80 mg/m2), Intravenous,  Once, 5 of 10 cycles Dose modification: 80 mg/m2 (original dose 80 mg/m2, Cycle 11) Administration: 175 mg (07/07/2018), 175 mg (07/21/2018), 175 mg (08/04/2018), 175 mg (08/18/2018), 175 mg (09/01/2018) PACLitaxel-protein bound  (ABRAXANE) chemo infusion 200 mg, 100 mg/m2 = 200 mg, Intravenous, Once, 10 of 10 cycles Dose modification: 80 mg/m2 (original dose 100 mg/m2, Cycle 2, Reason: Provider Judgment), 80 mg/m2 (original dose 100 mg/m2, Cycle 2, Reason: Provider Judgment) Administration: 200 mg (12/31/2017), 200 mg (01/06/2018), 175 mg (01/20/2018), 175 mg (01/27/2018), 175 mg (02/10/2018), 175 mg (03/03/2018), 175 mg (03/10/2018), 175 mg (03/24/2018), 175 mg (04/07/2018), 175 mg (04/14/2018), 175 mg (04/28/2018), 175 mg (05/05/2018), 175 mg (05/26/2018), 175 mg (06/09/2018), 175 mg (05/19/2018)  for chemotherapy treatment.    Metastasis to liver ([place])  08/04/2017 Initial Diagnosis   Metastasis to liver ([place])   08/11/2017 - 11/03/2017 Chemotherapy   The patient had ado-trastuzumab emtansine (KADCYLA) 320 mg in sodium chloride 0.9 % 250 mL chemo infusion, 3.6 mg/kg = 320 mg, Intravenous, Once, 4 of 5 cycles Administration: 320 mg (08/12/2017), 320 mg (09/01/2017), 320 mg (09/23/2017)  for chemotherapy treatment.    11/18/2017 -  Chemotherapy   The patient had trastuzumab (HERCEPTIN) 546 mg in sodium chloride 0.9 % 250 mL chemo infusion, 6 mg/kg = 546 mg (100 % of original dose 6 mg/kg), Intravenous,  Once, 2 of 2 cycles Dose modification: 6 mg/kg (original dose 6 mg/kg, Cycle 1, Reason: Provider Judgment) Administration: 546 mg (11/18/2017), 546 mg (12/16/2017) trastuzumab-anns (KANJINTI) 546 mg in sodium chloride 0.9 % 250 mL chemo infusion, 6 mg/kg = 546 mg (100 % of original dose 6 mg/kg), Intravenous,  Once, 9 of 19 cycles Dose modification: 6 mg/kg (original dose 6 mg/kg, Cycle 3, Reason: Other (see comments), Comment: requested step  therapy by [facility] insurance), 4 mg/kg (original dose 6 mg/kg, Cycle 9, Reason: Provider Judgment) Administration: 546 mg (01/13/2018), 546 mg (02/10/2018), 546 mg (03/03/2018), 546 mg (03/24/2018), 546 mg (04/14/2018), 546 mg (05/05/2018), 357 mg (05/26/2018), 357 mg (06/09/2018), 357 mg (06/23/2018)  for chemotherapy  treatment.      CHIEF COMPLIANT: Follow-up of metastatic breast cancer  INTERVAL HISTORY: Darlene [name] is a 69 y.o. with above-mentioned history of HER-2 positive metastatic breast cancer currently on treatment with Trastuzumab/Pertuzumab every 4 weeks. Liver MRI on 09/20/19 showed an unchanged, treated liver lesion, not greater than 0.6cm, and unchanged, nonspecific prominent portacaval lymph nodes. She is a patient of Dr. Virgie Dad and I am seeing her in his absence. She presents to the clinic today for treatment and to review her MRI.   ALLERGIES:  is allergic to nsaids and tape.  MEDICATIONS:  Current Outpatient Medications  Medication Sig Dispense Refill  . Accu-Chek Softclix Lancets lancets 1 each by Other route 4 (four) times daily. Test blood sugars 4 x times daily 100 each 1  . aspirin EC 81 MG tablet Take 1 tablet (81 mg total) by mouth daily. 90 tablet 3  . Bempedoic Acid-Ezetimibe (NEXLIZET) 180-10 MG TABS Take 1 tablet by mouth daily. 90 tablet 3  . Blood Glucose Calibration (ACCU-CHEK GUIDE CONTROL) LIQD 1 Bottle by In Vitro route as needed. 1 each 0  . Blood Glucose Monitoring Suppl (ACCU-CHEK GUIDE) w/Device KIT 1 each by Does not apply route 4 (four) times daily. Use to test blood sugars 4x a day 1 kit 0  . buPROPion (WELLBUTRIN SR) 200 MG 12 hr tablet TAKE 1 TABLET AT 12 NOON. 90 tablet 0  . CALCIUM CITRATE-VITAMIN D PO Take 1 tablet by mouth 3 (three) times daily.     . carvedilol (COREG) 3.125 MG tablet TAKE 1 TABLET BY MOUTH TWICE DAILY. 180 tablet 3  . cholecalciferol (VITAMIN D) 1000 units tablet Take 1,000 Units by mouth daily.    . cholestyramine (QUESTRAN) 4 g packet Take 1 packet (4 g total) by mouth 2 (two) times daily as needed. 10 each 12  . Cyanocobalamin (VITAMIN B-12 PO) Take 1 tablet by mouth every 14 (fourteen) days.    . diphenhydrAMINE (BENADRYL) 25 MG tablet Take 25 mg by mouth daily as needed for allergies or sleep.     [name] glucose blood (ACCU-CHEK  GUIDE) test strip 1 each by Other route 4 (four) times daily. Accu Chek Guide  Test blood sugars 4 times a day 100 each 1  . insulin glargine (LANTUS SOLOSTAR) 100 UNIT/ML Solostar Pen Inject 10 Units into the skin daily. 9 mL 0  . loratadine (CLARITIN) 10 MG tablet Take 10 mg by mouth as needed for allergies.    [name] losartan (COZAAR) 100 MG tablet Take 1 tablet (100 mg total) by mouth daily. 90 tablet 0  . ondansetron (ZOFRAN) 4 MG tablet Take 1 tablet (4 mg total) by mouth every 8 (eight) hours as needed for nausea or vomiting. 20 tablet 0  . Pediatric Multivitamins-Iron (FLINTSTONES PLUS IRON) chewable tablet Chew 1 tablet by mouth 2 (two) times daily.    . Probiotic Product ([employer] PROBIOTIC PO) Take 1 capsule by mouth daily.     . prochlorperazine (COMPAZINE) 10 MG tablet Take 1 tablet (10 mg total) by mouth every 6 (six) hours as needed for nausea or vomiting. 30 tablet 0  . Propylene Glycol (SYSTANE BALANCE OP) Place 1-2 drops into both eyes 3 (three) times  daily as needed (for dry eyes).     [name] senna-docusate (SENNA PLUS) 8.6-50 MG tablet Take 1 tablet by mouth daily as needed for moderate constipation.     . SURE COMFORT PEN NEEDLES 32G X 4 MM MISC USE WITH INJECTIONS 100 each 0  . topiramate (TOPAMAX) 25 MG tablet Take 1 tablet (25 mg total) by mouth daily. 90 tablet 0  . VICTOZA 18 MG/3ML SOPN INJECT 1.8MG (0.3ML) SUBCUTANEOUSLY EACH AM 9 mL 2  . vortioxetine HBr (TRINTELLIX) 20 MG TABS tablet Take 1 tablet (20 mg total) by mouth at bedtime. 90 tablet 0   No current facility-administered medications for this visit.    PHYSICAL EXAMINATION: ECOG PERFORMANCE STATUS: 1 - Symptomatic but completely ambulatory  Vitals:   09/28/19 1028  BP: 129/61  Pulse: 65  Resp: 17  Temp: (!) 97.1 F (36.2 C)  SpO2: 100%   Filed Weights   09/28/19 1028  Weight: 173 lb 14.4 oz (78.9 kg)    LABORATORY DATA:  I have reviewed the data as listed CMP Latest Ref Rng & Units 08/31/2019 08/03/2019  07/28/2019  Glucose 70 - 99 mg/dL 89 134(H) -  BUN 8 - 23 mg/dL 31(H) 32(H) -  Creatinine 0.44 - 1.00 mg/dL 1.75(H) 1.68(H) -  Sodium 135 - 145 mmol/L 142 142 -  Potassium 3.5 - 5.1 mmol/L 4.3 4.2 -  Chloride 98 - 111 mmol/L 113(H) 115(H) -  CO2 22 - 32 mmol/L 23 21(L) -  Calcium 8.9 - 10.3 mg/dL 9.3 8.7(L) -  Total Protein 6.5 - 8.1 g/dL 6.3(L) 6.1(L) 6.2  Total Bilirubin 0.3 - 1.2 mg/dL 0.5 0.6 0.5  Alkaline Phos 38 - 126 U/L 68 105 120  AST 15 - 41 U/L 22 45(H) 46(H)  ALT 0 - 44 U/L 10 68(H) 63(H)    Lab Results  Component Value Date   WBC 4.9 09/28/2019   HGB 9.8 (L) 09/28/2019   HCT 29.4 (L) 09/28/2019   MCV 94.2 09/28/2019   PLT 170 09/28/2019   NEUTROABS 3.2 09/28/2019    ASSESSMENT & PLAN:  Breast cancer of upper-outer quadrant of right female breast ([place]) Metastatic breast cancer: Currently on Herceptin and Perjeta Liver MRI 09/20/2019: Unchanged subacute T2 hyperintense hypoenhancing appearance right aspect of the liver dome 6 mm.  No new liver lesions.  Unchanged nonspecific portacaval lymph nodes, cholelithiasis  Based on stable findings on the liver MRI, I recommended continuation of current treatment with Herceptin and Perjeta. Her husband is in a lot of pain from recent prostate seed implantation where the seeds apparently ended up in round areas.  Because of this he is in intractable pain.  She has been helping him as well as take care of the household.  This has been stressing her significantly.  Herceptin Perjeta toxicities: Patient appears to be tolerating it very well without any problems or concerns. Denies any diarrhea or nausea or vomiting.  Normocytic anemia: Probably from prior treatments.  Monitoring closely. Return to clinic every 3 weeks for Herceptin and Perjeta.    No orders of the defined types were placed in this encounter.  The patient has a good understanding of the overall plan. she agrees with it. she will call with any problems that may  develop before the next visit here.  Total time spent: 30 mins including face to face time and time spent for planning, charting and coordination of care  I, Molly Dorshimer, am acting as scribe for Dr. Nicholas Lose.  I  have reviewed the above documentation for accuracy and completeness, and I agree with the above.

## 2019-09-28 ENCOUNTER — Other Ambulatory Visit: Payer: Medicare PPO

## 2019-09-28 ENCOUNTER — Inpatient Hospital Stay: Payer: Medicare PPO

## 2019-09-28 ENCOUNTER — Inpatient Hospital Stay: Payer: Medicare PPO | Attending: Oncology

## 2019-09-28 ENCOUNTER — Ambulatory Visit: Payer: Medicare PPO

## 2019-09-28 ENCOUNTER — Other Ambulatory Visit: Payer: Self-pay

## 2019-09-28 ENCOUNTER — Inpatient Hospital Stay: Payer: Medicare PPO | Admitting: Hematology and Oncology

## 2019-09-28 ENCOUNTER — Ambulatory Visit: Payer: Medicare PPO | Admitting: Oncology

## 2019-09-28 VITALS — BP 135/72 | HR 71 | Temp 97.7°F | Resp 16

## 2019-09-28 DIAGNOSIS — C787 Secondary malignant neoplasm of liver and intrahepatic bile duct: Secondary | ICD-10-CM

## 2019-09-28 DIAGNOSIS — C50411 Malignant neoplasm of upper-outer quadrant of right female breast: Secondary | ICD-10-CM | POA: Insufficient documentation

## 2019-09-28 DIAGNOSIS — Z171 Estrogen receptor negative status [ER-]: Secondary | ICD-10-CM | POA: Insufficient documentation

## 2019-09-28 DIAGNOSIS — Z95828 Presence of other vascular implants and grafts: Secondary | ICD-10-CM

## 2019-09-28 DIAGNOSIS — C801 Malignant (primary) neoplasm, unspecified: Secondary | ICD-10-CM

## 2019-09-28 DIAGNOSIS — Z5112 Encounter for antineoplastic immunotherapy: Secondary | ICD-10-CM | POA: Insufficient documentation

## 2019-09-28 DIAGNOSIS — D649 Anemia, unspecified: Secondary | ICD-10-CM | POA: Diagnosis not present

## 2019-09-28 DIAGNOSIS — C50911 Malignant neoplasm of unspecified site of right female breast: Secondary | ICD-10-CM

## 2019-09-28 LAB — CBC WITH DIFFERENTIAL/PLATELET
Abs Immature Granulocytes: 0.02 10*3/uL (ref 0.00–0.07)
Basophils Absolute: 0 10*3/uL (ref 0.0–0.1)
Basophils Relative: 0 %
Eosinophils Absolute: 0.1 10*3/uL (ref 0.0–0.5)
Eosinophils Relative: 2 %
HCT: 29.4 % — ABNORMAL LOW (ref 36.0–46.0)
Hemoglobin: 9.8 g/dL — ABNORMAL LOW (ref 12.0–15.0)
Immature Granulocytes: 0 %
Lymphocytes Relative: 22 %
Lymphs Abs: 1.1 10*3/uL (ref 0.7–4.0)
MCH: 31.4 pg (ref 26.0–34.0)
MCHC: 33.3 g/dL (ref 30.0–36.0)
MCV: 94.2 fL (ref 80.0–100.0)
Monocytes Absolute: 0.4 10*3/uL (ref 0.1–1.0)
Monocytes Relative: 9 %
Neutro Abs: 3.2 10*3/uL (ref 1.7–7.7)
Neutrophils Relative %: 67 %
Platelets: 170 10*3/uL (ref 150–400)
RBC: 3.12 MIL/uL — ABNORMAL LOW (ref 3.87–5.11)
RDW: 12.8 % (ref 11.5–15.5)
WBC: 4.9 10*3/uL (ref 4.0–10.5)
nRBC: 0 % (ref 0.0–0.2)

## 2019-09-28 LAB — COMPREHENSIVE METABOLIC PANEL
ALT: 13 U/L (ref 0–44)
AST: 22 U/L (ref 15–41)
Albumin: 3.7 g/dL (ref 3.5–5.0)
Alkaline Phosphatase: 52 U/L (ref 38–126)
Anion gap: 6 (ref 5–15)
BUN: 33 mg/dL — ABNORMAL HIGH (ref 8–23)
CO2: 24 mmol/L (ref 22–32)
Calcium: 9 mg/dL (ref 8.9–10.3)
Chloride: 112 mmol/L — ABNORMAL HIGH (ref 98–111)
Creatinine, Ser: 1.92 mg/dL — ABNORMAL HIGH (ref 0.44–1.00)
GFR calc Af Amer: 31 mL/min — ABNORMAL LOW (ref >60)
GFR calc non Af Amer: 27 mL/min — ABNORMAL LOW (ref >60)
Glucose, Bld: 93 mg/dL (ref 70–99)
Potassium: 4 mmol/L (ref 3.5–5.1)
Sodium: 142 mmol/L (ref 135–145)
Total Bilirubin: 0.4 mg/dL (ref 0.3–1.2)
Total Protein: 6.4 g/dL — ABNORMAL LOW (ref 6.5–8.1)

## 2019-09-28 MED ORDER — SODIUM CHLORIDE 0.9 % IV SOLN
Freq: Once | INTRAVENOUS | Status: AC
  Filled 2019-09-28: qty 250

## 2019-09-28 MED ORDER — DIPHENHYDRAMINE HCL 25 MG PO CAPS
25.0000 mg | ORAL_CAPSULE | Freq: Once | ORAL | Status: AC
  Administered 2019-09-28: 25 mg via ORAL

## 2019-09-28 MED ORDER — SODIUM CHLORIDE 0.9% FLUSH
10.0000 mL | INTRAVENOUS | Status: DC | PRN
  Administered 2019-09-28: 10 mL via INTRAVENOUS
  Filled 2019-09-28: qty 10

## 2019-09-28 MED ORDER — TRASTUZUMAB-ANNS CHEMO 420 MG IV SOLR
450.0000 mg | Freq: Once | INTRAVENOUS | Status: AC
  Administered 2019-09-28: 450 mg via INTRAVENOUS
  Filled 2019-09-28: qty 21.43

## 2019-09-28 MED ORDER — HEPARIN SOD (PORK) LOCK FLUSH 100 UNIT/ML IV SOLN
500.0000 [IU] | Freq: Once | INTRAVENOUS | Status: AC | PRN
  Administered 2019-09-28: 500 [IU]
  Filled 2019-09-28: qty 5

## 2019-09-28 MED ORDER — DIPHENHYDRAMINE HCL 25 MG PO CAPS
ORAL_CAPSULE | ORAL | Status: AC
  Filled 2019-09-28: qty 1

## 2019-09-28 MED ORDER — SODIUM CHLORIDE 0.9% FLUSH
10.0000 mL | INTRAVENOUS | Status: DC | PRN
  Administered 2019-09-28: 10 mL
  Filled 2019-09-28: qty 10

## 2019-09-28 MED ORDER — ACETAMINOPHEN 325 MG PO TABS
650.0000 mg | ORAL_TABLET | Freq: Once | ORAL | Status: AC
  Administered 2019-09-28: 650 mg via ORAL

## 2019-09-28 MED ORDER — ACETAMINOPHEN 325 MG PO TABS
ORAL_TABLET | ORAL | Status: AC
  Filled 2019-09-28: qty 2

## 2019-09-28 MED ORDER — SODIUM CHLORIDE 0.9 % IV SOLN
420.0000 mg | Freq: Once | INTRAVENOUS | Status: AC
  Administered 2019-09-28: 420 mg via INTRAVENOUS
  Filled 2019-09-28: qty 14

## 2019-09-28 NOTE — Assessment & Plan Note (Signed)
Metastatic breast cancer: Currently on Herceptin and Perjeta Liver MRI 09/20/2019: Unchanged subacute T2 hyperintense hypoenhancing appearance right aspect of the liver dome 6 mm.  No new liver lesions.  Unchanged nonspecific portacaval lymph nodes, cholelithiasis  Based on stable findings on the liver MRI, I recommended continuation of current treatment with Herceptin and Perjeta.  Return to clinic every 3 weeks for Herceptin and Perjeta.

## 2019-09-28 NOTE — Patient Instructions (Signed)
[facility] Discharge Instructions for Patients Receiving Chemotherapy  Today you received the following chemotherapy agents: trastuzumab and pertuzumab.  To help prevent nausea and vomiting after your treatment, we encourage you to take your nausea medication as directed.   If you develop nausea and vomiting that is not controlled by your nausea medication, call the clinic.   BELOW ARE SYMPTOMS THAT SHOULD BE REPORTED IMMEDIATELY:  *FEVER GREATER THAN 100.5 F  *CHILLS WITH OR WITHOUT FEVER  NAUSEA AND VOMITING THAT IS NOT CONTROLLED WITH YOUR NAUSEA MEDICATION  *UNUSUAL SHORTNESS OF BREATH  *UNUSUAL BRUISING OR BLEEDING  TENDERNESS IN MOUTH AND THROAT WITH OR WITHOUT PRESENCE OF ULCERS  *URINARY PROBLEMS  *BOWEL PROBLEMS  UNUSUAL RASH Items with * indicate a potential emergency and should be followed up as soon as possible.  Feel free to call the clinic should you have any questions or concerns. The clinic phone number is [phone].  Please show the CHEMO ALERT CARD at check-in to the Emergency Department and triage nurse.   

## 2019-09-28 NOTE — Progress Notes (Signed)
Pt. declines to stay 30 minute post observation. Denies complaints. Left via ambulation, no shortness of breath noted.

## 2019-09-29 ENCOUNTER — Ambulatory Visit (INDEPENDENT_AMBULATORY_CARE_PROVIDER_SITE_OTHER): Payer: Medicare PPO | Admitting: Family Medicine

## 2019-09-29 ENCOUNTER — Telehealth: Payer: Self-pay | Admitting: Hematology and Oncology

## 2019-09-29 NOTE — Telephone Encounter (Signed)
No 9/7 los, no changes made to pt schedule

## 2019-09-30 DIAGNOSIS — R269 Unspecified abnormalities of gait and mobility: Secondary | ICD-10-CM | POA: Diagnosis not present

## 2019-09-30 DIAGNOSIS — R296 Repeated falls: Secondary | ICD-10-CM | POA: Diagnosis not present

## 2019-09-30 DIAGNOSIS — M25672 Stiffness of left ankle, not elsewhere classified: Secondary | ICD-10-CM | POA: Diagnosis not present

## 2019-09-30 DIAGNOSIS — M6259 Muscle wasting and atrophy, not elsewhere classified, multiple sites: Secondary | ICD-10-CM | POA: Diagnosis not present

## 2019-10-01 DIAGNOSIS — R296 Repeated falls: Secondary | ICD-10-CM | POA: Diagnosis not present

## 2019-10-01 DIAGNOSIS — M6259 Muscle wasting and atrophy, not elsewhere classified, multiple sites: Secondary | ICD-10-CM | POA: Diagnosis not present

## 2019-10-01 DIAGNOSIS — R269 Unspecified abnormalities of gait and mobility: Secondary | ICD-10-CM | POA: Diagnosis not present

## 2019-10-01 DIAGNOSIS — M25672 Stiffness of left ankle, not elsewhere classified: Secondary | ICD-10-CM | POA: Diagnosis not present

## 2019-10-04 DIAGNOSIS — M6259 Muscle wasting and atrophy, not elsewhere classified, multiple sites: Secondary | ICD-10-CM | POA: Diagnosis not present

## 2019-10-04 DIAGNOSIS — R296 Repeated falls: Secondary | ICD-10-CM | POA: Diagnosis not present

## 2019-10-04 DIAGNOSIS — R269 Unspecified abnormalities of gait and mobility: Secondary | ICD-10-CM | POA: Diagnosis not present

## 2019-10-04 DIAGNOSIS — M25672 Stiffness of left ankle, not elsewhere classified: Secondary | ICD-10-CM | POA: Diagnosis not present

## 2019-10-05 ENCOUNTER — Encounter (INDEPENDENT_AMBULATORY_CARE_PROVIDER_SITE_OTHER): Payer: Self-pay | Admitting: Family Medicine

## 2019-10-05 ENCOUNTER — Ambulatory Visit (INDEPENDENT_AMBULATORY_CARE_PROVIDER_SITE_OTHER): Payer: Medicare PPO | Admitting: Family Medicine

## 2019-10-05 ENCOUNTER — Other Ambulatory Visit: Payer: Self-pay

## 2019-10-05 VITALS — BP 114/54 | HR 54 | Temp 97.8°F | Ht 69.0 in | Wt 169.0 lb

## 2019-10-05 DIAGNOSIS — E559 Vitamin D deficiency, unspecified: Secondary | ICD-10-CM | POA: Diagnosis not present

## 2019-10-05 DIAGNOSIS — Z6830 Body mass index (BMI) 30.0-30.9, adult: Secondary | ICD-10-CM | POA: Diagnosis not present

## 2019-10-05 DIAGNOSIS — Z794 Long term (current) use of insulin: Secondary | ICD-10-CM

## 2019-10-05 DIAGNOSIS — E1159 Type 2 diabetes mellitus with other circulatory complications: Secondary | ICD-10-CM

## 2019-10-05 DIAGNOSIS — F3289 Other specified depressive episodes: Secondary | ICD-10-CM | POA: Diagnosis not present

## 2019-10-05 DIAGNOSIS — E1121 Type 2 diabetes mellitus with diabetic nephropathy: Secondary | ICD-10-CM | POA: Diagnosis not present

## 2019-10-05 DIAGNOSIS — E669 Obesity, unspecified: Secondary | ICD-10-CM | POA: Diagnosis not present

## 2019-10-05 DIAGNOSIS — I10 Essential (primary) hypertension: Secondary | ICD-10-CM | POA: Diagnosis not present

## 2019-10-05 DIAGNOSIS — I152 Hypertension secondary to endocrine disorders: Secondary | ICD-10-CM

## 2019-10-05 MED ORDER — ACCU-CHEK SOFTCLIX LANCETS MISC: 1.0000 | Freq: Four times a day (QID) | 1 refills | Status: DC

## 2019-10-05 MED ORDER — VICTOZA 18 MG/3ML ~~LOC~~ SOPN: PEN_INJECTOR | SUBCUTANEOUS | 2 refills | Status: DC

## 2019-10-05 MED ORDER — LOSARTAN POTASSIUM 100 MG PO TABS: 100.0000 mg | ORAL_TABLET | Freq: Every day | ORAL | 0 refills | Status: DC

## 2019-10-05 MED ORDER — TOPIRAMATE 25 MG PO TABS: 25.0000 mg | ORAL_TABLET | Freq: Every day | ORAL | 0 refills | Status: DC

## 2019-10-06 ENCOUNTER — Ambulatory Visit: Payer: Medicare Other | Admitting: Neurology

## 2019-10-06 ENCOUNTER — Ambulatory Visit: Payer: Medicare PPO | Admitting: Adult Health

## 2019-10-06 ENCOUNTER — Other Ambulatory Visit: Payer: Medicare PPO | Admitting: Orthotics

## 2019-10-06 ENCOUNTER — Encounter: Payer: Self-pay | Admitting: Adult Health

## 2019-10-06 VITALS — BP 120/70 | HR 74 | Ht 69.0 in | Wt 172.0 lb

## 2019-10-06 DIAGNOSIS — G4733 Obstructive sleep apnea (adult) (pediatric): Secondary | ICD-10-CM | POA: Diagnosis not present

## 2019-10-06 DIAGNOSIS — M25672 Stiffness of left ankle, not elsewhere classified: Secondary | ICD-10-CM | POA: Diagnosis not present

## 2019-10-06 DIAGNOSIS — Z9989 Dependence on other enabling machines and devices: Secondary | ICD-10-CM | POA: Diagnosis not present

## 2019-10-06 DIAGNOSIS — R296 Repeated falls: Secondary | ICD-10-CM | POA: Diagnosis not present

## 2019-10-06 DIAGNOSIS — M6259 Muscle wasting and atrophy, not elsewhere classified, multiple sites: Secondary | ICD-10-CM | POA: Diagnosis not present

## 2019-10-06 DIAGNOSIS — R269 Unspecified abnormalities of gait and mobility: Secondary | ICD-10-CM | POA: Diagnosis not present

## 2019-10-06 LAB — HEMOGLOBIN A1C
Est. average glucose Bld gHb Est-mCnc: 108 mg/dL
Hgb A1c MFr Bld: 5.4 % (ref 4.8–5.6)

## 2019-10-06 LAB — VITAMIN D 25 HYDROXY (VIT D DEFICIENCY, FRACTURES): Vit D, 25-Hydroxy: 43.6 ng/mL (ref 30.0–100.0)

## 2019-10-06 NOTE — Patient Instructions (Signed)
Your Plan:  Complete home sleep test If your symptoms worsen or you develop new symptoms please let us know.       Thank you for coming to see [name] at [facility]. I hope we have been able to provide you high quality care today.  You may receive a patient satisfaction survey over the next few weeks. We would appreciate your feedback and comments so that we may continue to improve ourselves and the health of our patients.

## 2019-10-06 NOTE — Progress Notes (Signed)
PATIENT: Darlene [name] DOB: [birth year]  REASON FOR VISIT: follow up HISTORY FROM: patient  HISTORY OF PRESENT ILLNESS: Today 10/06/19:  [name] is a 69 year old female with a history of obstructive sleep apnea on CPAP.  She returns today for follow-up.  The patient reports that over the last year she stopped using the CPAP for several different reasons.  When she was having trouble with her mask.  She also fell and broke both wrists which made it hard to put her mask on.  She also states that she has been working with healthy weight and wellness and has continued to lose weight.  When she began seeing Dr. Brett Fairy in 2015 she weighed 237 pounds.  She now weighs 172 pounds.  HISTORY 10-05-2018, I have the pleasure of meeting with Darlene [name] today who prefers to go [place], she is a meanwhile 69 year old Caucasian right-handed female and has been followed in our clinic for over 5 years.  Her primary care provider is Dr. Aneta Mins, and she also followed [facility].  She has lost a significant amount of weight.  Current weight is 195 pounds.  Patient has completed chemotherapy and is sleeping well. She has some trouble with the mask fit and oral air leak- her head straps interfered with patchy hair regrowth.. Her fatigue severity score was endorsed at 23 points and the Epworth sleepiness score at 4 points, the geriatric depression score was 1 out of 15 points.  Overall she describes her sleep as better but there have been some issues with the mask fit and we have changed the mask in the past. She has DM, had retinopathy, and follows Dr Jana Hakim.  She had to pay 45.000 usd for her therapies. [name]'s last echocardiogram on 09/10/2018, showed an ejection fraction in the 55% - 60% range.  She underwent a right foot xray on 08/11/2018 showing: Significant bunion deformities present. No evidence of acute fracture. She also underwent a liver MRI on  09/14/18 showing: Stable treated metastasis in the RIGHT hepatic lobe without evidence of progression. No new hepatic lesions present. Stable nodular enlargement of the LEFT adrenal gland which was not hypermetabolic on comparison PET-CT scans.   REVIEW OF SYSTEMS: Out of a complete 14 system review of symptoms, the patient complains only of the following symptoms, and all other reviewed systems are negative.  FSS 8 ESS 3  ALLERGIES: Allergies  Allergen Reactions  . Nsaids Other (See Comments)    H/o gastric bypass - avoid NSAIDs  . Tape Hives, Rash and Other (See Comments)    Adhesives in bandaids    HOME MEDICATIONS: Outpatient Medications Prior to Visit  Medication Sig Dispense Refill  . Accu-Chek Softclix Lancets lancets 1 each by Other route 4 (four) times daily. Test blood sugars 4 x times daily 100 each 1  . aspirin EC 81 MG tablet Take 1 tablet (81 mg total) by mouth daily. 90 tablet 3  . Bempedoic Acid-Ezetimibe (NEXLIZET) 180-10 MG TABS Take 1 tablet by mouth daily. 90 tablet 3  . Blood Glucose Calibration (ACCU-CHEK GUIDE CONTROL) LIQD 1 Bottle by In Vitro route as needed. 1 each 0  . Blood Glucose Monitoring Suppl (ACCU-CHEK GUIDE) w/Device KIT 1 each by Does not apply route 4 (four) times daily. Use to test blood sugars 4x a day 1 kit 0  . buPROPion (WELLBUTRIN SR) 200 MG 12 hr tablet TAKE 1 TABLET AT 12 NOON. 90 tablet 0  . CALCIUM  CITRATE-VITAMIN D PO Take 1 tablet by mouth 3 (three) times daily.     . carvedilol (COREG) 3.125 MG tablet TAKE 1 TABLET BY MOUTH TWICE DAILY. 180 tablet 3  . cholecalciferol (VITAMIN D) 1000 units tablet Take 1,000 Units by mouth daily.    . cholestyramine (QUESTRAN) 4 g packet Take 1 packet (4 g total) by mouth 2 (two) times daily as needed. 10 each 12  . Cyanocobalamin (VITAMIN B-12 PO) Take 1 tablet by mouth every 14 (fourteen) days.    . diphenhydrAMINE (BENADRYL) 25 MG tablet Take 25 mg by mouth daily as needed for allergies or sleep.      [name] glucose blood (ACCU-CHEK GUIDE) test strip 1 each by Other route 4 (four) times daily. Accu Chek Guide  Test blood sugars 4 times a day 100 each 1  . liraglutide (VICTOZA) 18 MG/3ML SOPN Pt to take 1.8 mg qam 9 mL 2  . loratadine (CLARITIN) 10 MG tablet Take 10 mg by mouth as needed for allergies.    [name] losartan (COZAAR) 100 MG tablet Take 1 tablet (100 mg total) by mouth daily. 90 tablet 0  . ondansetron (ZOFRAN) 4 MG tablet Take 1 tablet (4 mg total) by mouth every 8 (eight) hours as needed for nausea or vomiting. 20 tablet 0  . Pediatric Multivitamins-Iron (FLINTSTONES PLUS IRON) chewable tablet Chew 1 tablet by mouth 2 (two) times daily.    . Probiotic Product ([employer] PROBIOTIC PO) Take 1 capsule by mouth daily.     . prochlorperazine (COMPAZINE) 10 MG tablet Take 1 tablet (10 mg total) by mouth every 6 (six) hours as needed for nausea or vomiting. 30 tablet 0  . Propylene Glycol (SYSTANE BALANCE OP) Place 1-2 drops into both eyes 3 (three) times daily as needed (for dry eyes).     [name] senna-docusate (SENNA PLUS) 8.6-50 MG tablet Take 1 tablet by mouth daily as needed for moderate constipation.     . SURE COMFORT PEN NEEDLES 32G X 4 MM MISC USE WITH INJECTIONS 100 each 0  . topiramate (TOPAMAX) 25 MG tablet Take 1 tablet (25 mg total) by mouth daily. 90 tablet 0  . vortioxetine HBr (TRINTELLIX) 20 MG TABS tablet Take 1 tablet (20 mg total) by mouth at bedtime. 90 tablet 0  . insulin glargine (LANTUS SOLOSTAR) 100 UNIT/ML Solostar Pen Inject 10 Units into the skin daily. 9 mL 0   No facility-administered medications prior to visit.    PAST MEDICAL HISTORY: Past Medical History:  Diagnosis Date  . Anemia    hx of - during 1st round chemo  . Anxiety   . Arthritis    in fingers, shoulders  . Back pain   . Balance problem   . Breast cancer ([place])   . Breast cancer of upper-outer quadrant of right female breast ([place]) 07/22/2014  . Bunion, right foot   . Chronic kidney disease    creatininei  levels high per pt   . Clumsiness   . Constipation   . Depression   . Diabetes mellitus without complication ([place])    76+ years type 2   . Diabetic retinopathy ([place])    torn retina  . Eating disorder    binge eating  . Epistaxis 08/26/2014  . Fatty liver   . Foot drop, left foot   . HCAP (healthcare-associated pneumonia) 12/18/2014  . Heart murmur    never had any problems  . History of radiation therapy 04/05/15-05/19/15   right chest wall  was treated to 50.4 Gy in 28 fractions, right mastectomy scar was treated to 10 Gy in 5 fractions  . Hyperlipidemia   . Hypertension   . Joint pain   . Multiple food allergies   . Neuromuscular disorder ([place])    diabetic neuropathy  . Obesity   . Obstructive sleep apnea on CPAP    does not use cpap all the time has lost 160 lbs   . Ovarian cyst rupture    possible  . Pneumonia   . Pneumonia 11/2014  . Radiation 04/05/15-05/19/15   right chest wall 50.4 Gy, mastectomy scar 10 Gy  . Sleep apnea   . Thyroid nodule 06/2014    PAST SURGICAL HISTORY: Past Surgical History:  Procedure Laterality Date  . APPENDECTOMY    . BIOPSY THYROID Bilateral 06/2014   2 nodules - Benign   . BREAST LUMPECTOMY WITH RADIOACTIVE SEED AND SENTINEL LYMPH NODE BIOPSY Right 12/27/2014   Procedure: BREAST LUMPECTOMY WITH RADIOACTIVE SEED AND SENTINEL LYMPH NODE BIOPSY;  Surgeon: Stark Klein, MD;  Location: [facility];  Service: General;  Laterality: Right;  . BREAST REDUCTION SURGERY Bilateral 01/06/2015   Procedure: BILATERAL BREAST REDUCTION, RIGHT ONCOPLASTIC RECONSTRUCTION),LEFT BREAST REDUCTION FOR SYMMETRY;  Surgeon: [name], MD;  Location: [facility];  Service: Plastics;  Laterality: Bilateral;  . BREATH TEK H PYLORI N/A 09/15/2012   Procedure: BREATH TEK H PYLORI;  Surgeon: Pedro Earls, MD;  Location: [facility];  Service: General;  Laterality: N/A;  . BUNIONECTOMY Left 12/2013  . CARPAL TUNNEL RELEASE Right   . CARPAL TUNNEL RELEASE  Left   . CATARACT EXTRACTION     6/19  . COLONOSCOPY    . DUPUYTREN CONTRACTURE RELEASE    . GASTRIC ROUX-EN-Y N/A 11/02/2012   Procedure: LAPAROSCOPIC ROUX-EN-Y GASTRIC;  Surgeon: Pedro Earls, MD;  Location: [facility];  Service: General;  Laterality: N/A;  . IR GENERIC HISTORICAL  03/18/2016   IR US GUIDE VASC ACCESS LEFT 03/18/2016 Markus Daft, MD [facility]  . IR GENERIC HISTORICAL  03/18/2016   IR FLUORO GUIDE CV LINE LEFT 03/18/2016 Markus Daft, MD [facility]  . LAPAROSCOPIC APPENDECTOMY N/A 07/22/2015   Procedure: APPENDECTOMY LAPAROSCOPIC;  Surgeon: Michael Boston, MD;  [facility];  Service: General;  Laterality: N/A;  . MASTECTOMY Right 02/15/2015   modified  . MODIFIED MASTECTOMY Right 02/15/2015   Procedure: RIGHT MODIFIED RADICAL MASTECTOMY;  Surgeon: Stark Klein, MD;  Location: [facility];  Service: General;  Laterality: Right;  . ORIF WRIST FRACTURE Left 05/11/2019   Procedure: OPEN REDUCTION INTERNAL FIXATION (ORIF) left distal radius fracture;  Surgeon: Renette Butters, MD;  Location: [facility];  Service: Orthopedics;  Laterality: Left;  with block  . PORT-A-CATH REMOVAL Left 10/10/2015   Procedure: PORT REMOVAL;  Surgeon: Stark Klein, MD;  Location: [facility];  Service: General;  Laterality: Left;  . PORTACATH PLACEMENT Left 08/02/2014   Procedure: INSERTION PORT-A-CATH;  Surgeon: Stark Klein, MD;  Location: [facility];  Service: General;  Laterality: Left;  . PORTACATH PLACEMENT N/A 11/05/2016   Procedure: INSERTION PORT-A-CATH;  Surgeon: Stark Klein, MD;  Location: [facility];  Service: General;  Laterality: N/A;  . RETINAL LASER PROCEDURE     retina tear on left eye  . ROTATOR CUFF REPAIR Right 2005  . SCAR REVISION Right 12/08/2015   Procedure: COMPLEX REPAIR RIGHT CHEST 10-15CM;  Surgeon: [name], MD;  Location: [facility];  Service: Plastics;  Laterality: Right;  .  TOE SURGERY  1970s   bone spur  . TRIGGER FINGER RELEASE     x3     FAMILY HISTORY: Family History  Problem Relation Age of Onset  . CVA Mother   . Heart disease Mother   . Sudden death Mother   . Diabetes Father   . Heart failure Father   . Hypertension Father   . Kidney disease Father   . Obesity Father   . Hypertension Sister   . Diabetes Brother   . Breast cancer Paternal Grandmother 56  . Diabetes Paternal Uncle   . Ovarian cancer Neg Hx     SOCIAL HISTORY: Social History   Socioeconomic History  . Marital status: Married    Spouse name: [name]  . Number of children: 0  . Years of education: Bachelor's  . Highest education level: Not on file  Occupational History  . Occupation: Pensions consultant: [employer]  Tobacco Use  . Smoking status: Never Smoker  . Smokeless tobacco: Never Used  Vaping Use  . Vaping Use: Never used  Substance and Sexual Activity  . Alcohol use: Yes    Alcohol/week: 1.0 - 2.0 standard drink    Types: 1 - 2 Standard drinks or equivalent per week    Comment: social  . Drug use: No  . Sexual activity: Yes    Partners: Male    Birth control/protection: Post-menopausal, Other-see comments    Comment: husband vasectomy  Other Topics Concern  . Not on file  Social History Narrative   Patient is married [name]) and lives at home with her husband.   Patient does not have any children.   Patient is working for a [employer].   Patient has a Bachelor's degree.   Patient is right-handed.   Patient does not drink any caffeine.   Social Determinants of Health   Financial Resource Strain:   . Difficulty of Paying Living Expenses: Not on file  Food Insecurity:   . Worried About Charity fundraiser in the Last Year: Not on file  . Ran Out of Food in the Last Year: Not on file  Transportation Needs:   . Lack of Transportation (Medical): Not on file  . Lack of Transportation (Non-Medical): Not on file  Physical Activity:   . Days of Exercise per Week: Not on file  . Minutes  of Exercise per Session: Not on file  Stress:   . Feeling of Stress : Not on file  Social Connections:   . Frequency of Communication with Friends and Family: Not on file  . Frequency of Social Gatherings with Friends and Family: Not on file  . Attends Religious Services: Not on file  . Active Member of Clubs or Organizations: Not on file  . Attends Archivist Meetings: Not on file  . Marital Status: Not on file  Intimate Partner Violence:   . Fear of Current or Ex-Partner: Not on file  . Emotionally Abused: Not on file  . Physically Abused: Not on file  . Sexually Abused: Not on file      PHYSICAL EXAM  Vitals:   10/06/19 1319  BP: 120/70  Pulse: 74  Weight: 172 lb (78 kg)  Height: '5\' 9"'  (1.753 m)   Body mass index is 25.4 kg/m.  Generalized: Well developed, in no acute distress  Chest: Lungs clear to auscultation bilaterally  Neurological examination  Mentation: Alert oriented to time, place, history taking. Follows all commands speech  and language fluent Cranial nerve II-XII: Extraocular movements were full, visual field were full on confrontational test Head turning and shoulder shrug  were normal and symmetric. Motor: The motor testing reveals 5 over 5 strength of all 4 extremities. Good symmetric motor tone is noted throughout.  Sensory: Sensory testing is intact to soft touch on all 4 extremities. No evidence of extinction is noted.  Gait and station: Gait is normal.    DIAGNOSTIC DATA (LABS, IMAGING, TESTING) - I reviewed patient records, labs, notes, testing and imaging myself where available.  Lab Results  Component Value Date   WBC 4.9 09/28/2019   HGB 9.8 (L) 09/28/2019   HCT 29.4 (L) 09/28/2019   MCV 94.2 09/28/2019   PLT 170 09/28/2019      Component Value Date/Time   NA 142 09/28/2019 1017   NA 146 (H) 01/25/2019 0957   NA 140 01/07/2017 0758   K 4.0 09/28/2019 1017   K 4.1 01/07/2017 0758   CL 112 (H) 09/28/2019 1017   CO2 24  09/28/2019 1017   CO2 24 01/07/2017 0758   GLUCOSE 93 09/28/2019 1017   GLUCOSE 134 01/07/2017 0758   BUN 33 (H) 09/28/2019 1017   BUN 25 01/25/2019 0957   BUN 33.6 (H) 01/07/2017 0758   CREATININE 1.92 (H) 09/28/2019 1017   CREATININE 1.62 (H) 04/28/2018 0915   CREATININE 1.5 (H) 01/07/2017 0758   CALCIUM 9.0 09/28/2019 1017   CALCIUM 9.3 01/07/2017 0758   PROT 6.4 (L) 09/28/2019 1017   PROT 6.2 07/28/2019 0917   PROT 6.7 01/07/2017 0758   ALBUMIN 3.7 09/28/2019 1017   ALBUMIN 4.0 07/28/2019 0917   ALBUMIN 3.6 01/07/2017 0758   AST 22 09/28/2019 1017   AST 43 (H) 04/28/2018 0915   AST 51 (H) 01/07/2017 0758   ALT 13 09/28/2019 1017   ALT 61 (H) 04/28/2018 0915   ALT 82 (H) 01/07/2017 0758   ALKPHOS 52 09/28/2019 1017   ALKPHOS 115 01/07/2017 0758   BILITOT 0.4 09/28/2019 1017   BILITOT 0.5 07/28/2019 0917   BILITOT 0.6 04/28/2018 0915   BILITOT 0.72 01/07/2017 0758   GFRNONAA 27 (L) 09/28/2019 1017   GFRNONAA 33 (L) 04/28/2018 0915   GFRNONAA 55 (L) 02/18/2013 0827   GFRAA 31 (L) 09/28/2019 1017   GFRAA 38 (L) 04/28/2018 0915   GFRAA 63 02/18/2013 0827   Lab Results  Component Value Date   CHOL 144 07/28/2019   HDL 74 07/28/2019   LDLCALC 57 07/28/2019   TRIG 66 07/28/2019   CHOLHDL 1.9 07/28/2019   Lab Results  Component Value Date   HGBA1C 5.4 10/05/2019   Lab Results  Component Value Date   VITAMINB12 1,039 08/27/2018   Lab Results  Component Value Date   TSH 1.54 02/07/2017      ASSESSMENT AND PLAN 69 y.o. year old female  has a past medical history of Anemia, Anxiety, Arthritis, Back pain, Balance problem, Breast cancer ([place]), Breast cancer of upper-outer quadrant of right female breast ([place]) (07/22/2014), Bunion, right foot, Chronic kidney disease, Clumsiness, Constipation, Depression, Diabetes mellitus without complication ([place]), Diabetic retinopathy ([place]), Eating disorder, Epistaxis (08/26/2014), Fatty liver, Foot drop, left foot, HCAP  (healthcare-associated pneumonia) (12/18/2014), Heart murmur, History of radiation therapy (04/05/15-05/19/15), Hyperlipidemia, Hypertension, Joint pain, Multiple food allergies, Neuromuscular disorder ([place]), Obesity, Obstructive sleep apnea on CPAP, Ovarian cyst rupture, Pneumonia, Pneumonia (11/2014), Radiation (04/05/15-05/19/15), Sleep apnea, and Thyroid nodule (06/2014). here with:  1. OSA   Patient will repeat a home  sleep test to confirm that she still has apnea.  If yes we will get her set up on a new machine.  Patient is amenable to this plan.  She will follow-up after her sleep test.   I spent 20 minutes of face-to-face and non-face-to-face time with patient.  This included previsit chart review, lab review, study review, order entry, electronic health record documentation, patient education.  Ward Givens, MSN, NP-C 10/06/2019, 1:36 PM [facility] [address] [place], [place] [address] [phone]

## 2019-10-07 ENCOUNTER — Other Ambulatory Visit (INDEPENDENT_AMBULATORY_CARE_PROVIDER_SITE_OTHER): Payer: Self-pay | Admitting: Family Medicine

## 2019-10-07 DIAGNOSIS — F3289 Other specified depressive episodes: Secondary | ICD-10-CM

## 2019-10-11 ENCOUNTER — Encounter (INDEPENDENT_AMBULATORY_CARE_PROVIDER_SITE_OTHER): Payer: Self-pay | Admitting: Family Medicine

## 2019-10-11 NOTE — Progress Notes (Signed)
Chief Complaint:   OBESITY Murdis is here to discuss her progress with her obesity treatment plan along with follow-up of her obesity related diagnoses. Hend is on keeping a food journal and adhering to recommended goals of 1600-1800 calories and 85+ grams of protein daily and states she is following her eating plan approximately 50% of the time. Bob states she is doing physical therapy for 60 minutes 2 times per week.  Today's visit was #: 60 Starting weight: 225 lbs Starting date: 08/09/2016 Today's weight: 169 lbs Today's date: 10/05/2019 Total lbs lost to date: 56 Total lbs lost since last in-office visit: 3  Interim History: Lekeshia's goal is to maintain her weight and she has lost another 3 lbs. She is doing more portion control and smarter food choices, and may not be meeting her protein goal which is likely decreasing her lean muscle mass despite her physical therapy.  Subjective:   1. Type 2 diabetes mellitus with diabetic nephropathy, with long-term current use of insulin ([place]) Keonta is tolerating Victoza well, and she denies nausea, vomiting, or hypoglycemia. She is due for labs.  2. Vitamin D deficiency Aybree is stable on Vit D, and she is due for labs.  3. Hypertension associated with type 2 diabetes mellitus ([place]) Lyrical's blood pressure is stable on her medications. She requests a refill today.  4. Other depression, with emotional eating Tyrina is stable on Topamax and she is doing well avoiding emotional eating, and she is sleeping well reasonably well overall.  Assessment/Plan:   1. Type 2 diabetes mellitus with diabetic nephropathy, with long-term current use of insulin ([facility]) Good blood sugar control is important to decrease the likelihood of diabetic complications such as nephropathy, neuropathy, limb loss, blindness, coronary artery disease, and death. Intensive lifestyle modification including diet, exercise and weight loss are the  first line of treatment for diabetes. We will check labs today. Asjah will continue her medications, and we will refill Victoza for 90 with no refill. We will refill lancet #100 with no refill.  - Hemoglobin A1c - Accu-Chek Softclix Lancets lancets; 1 each by Other route 4 (four) times daily. Test blood sugars 4 x times daily  Dispense: 100 each; Refill: 1 - liraglutide (VICTOZA) 18 MG/3ML SOPN; Pt to take 1.8 mg qam  Dispense: 9 mL; Refill: 2  2. Vitamin D deficiency Low Vitamin D level contributes to fatigue and are associated with obesity, breast, and colon cancer. We will check labs today. Yarithza will follow-up for routine testing of Vitamin D, at least 2-3 times per year to avoid over-replacement.  - VITAMIN D 25 Hydroxy (Vit-D Deficiency, Fractures)  3. Hypertension associated with type 2 diabetes mellitus ([place]) Avnoor is working on healthy weight loss and exercise to improve blood pressure control. We will watch for signs of hypotension as she continues her lifestyle modifications. We will refill losartan for 90 days with no refills.  - losartan (COZAAR) 100 MG tablet; Take 1 tablet (100 mg total) by mouth daily.  Dispense: 90 tablet; Refill: 0  4. Other depression, with emotional eating Behavior modification techniques were discussed today to help Monea deal with her emotional/non-hunger eating behaviors.  Orders and follow up as documented in patient record.   - topiramate (TOPAMAX) 25 MG tablet; Take 1 tablet (25 mg total) by mouth daily.  Dispense: 90 tablet; Refill: 0  5. Class 1 obesity with serious comorbidity and body mass index (BMI) of 30.0 to 30.9 in adult, unspecified obesity type [name]  is currently in the action stage of change. As such, her goal is to continue with weight loss efforts. She has agreed to keeping a food journal and adhering to recommended goals of 1600-1800 calories and 85+ grams of protein daily.    Exercise goals: As is.  Behavioral  modification strategies: keeping a strict food journal.  Tereasa has agreed to follow-up with our clinic in 12 weeks. She was informed of the importance of frequent follow-up visits to maximize her success with intensive lifestyle modifications for her multiple health conditions.   Shamekia was informed we would discuss her lab results at her next visit unless there is a critical issue that needs to be addressed sooner. Jordi agreed to keep her next visit at the agreed upon time to discuss these results.  Objective:   Blood pressure (!) 114/54, pulse (!) 54, temperature 97.8 F (36.6 C), height 5\' 9"  (1.753 m), weight 169 lb (76.7 kg), last menstrual period 10/22/2007, SpO2 97 %. Body mass index is 24.96 kg/m.  General: Cooperative, alert, well developed, in no acute distress. HEENT: Conjunctivae and lids unremarkable. Cardiovascular: Regular rhythm.  Lungs: Normal work of breathing. Neurologic: No focal deficits.   Lab Results  Component Value Date   CREATININE 1.92 (H) 09/28/2019   BUN 33 (H) 09/28/2019   NA 142 09/28/2019   K 4.0 09/28/2019   CL 112 (H) 09/28/2019   CO2 24 09/28/2019   Lab Results  Component Value Date   ALT 13 09/28/2019   AST 22 09/28/2019   ALKPHOS 52 09/28/2019   BILITOT 0.4 09/28/2019   Lab Results  Component Value Date   HGBA1C 5.4 10/05/2019   HGBA1C 5.6 05/11/2019   HGBA1C 5.5 01/25/2019   HGBA1C 5.4 08/27/2018   HGBA1C 5.8 (H) 09/03/2017   Lab Results  Component Value Date   INSULIN 7.6 01/25/2019   INSULIN 15.3 03/11/2017   INSULIN 18.3 08/09/2016   Lab Results  Component Value Date   TSH 1.54 02/07/2017   Lab Results  Component Value Date   CHOL 144 07/28/2019   HDL 74 07/28/2019   LDLCALC 57 07/28/2019   TRIG 66 07/28/2019   CHOLHDL 1.9 07/28/2019   Lab Results  Component Value Date   WBC 4.9 09/28/2019   HGB 9.8 (L) 09/28/2019   HCT 29.4 (L) 09/28/2019   MCV 94.2 09/28/2019   PLT 170 09/28/2019   Lab Results   Component Value Date   IRON 58 02/18/2013   TIBC 305 02/18/2013   FERRITIN 133 10/31/2014    Obesity Behavioral Intervention:   Approximately 15 minutes were spent on the discussion below.  ASK: We discussed the diagnosis of obesity with [name] today and Memory agreed to give [name] permission to discuss obesity behavioral modification therapy today.  ASSESS: Joreen has the diagnosis of obesity and her BMI today is 25.39. Ryian is in the action stage of change.   ADVISE: Karmin was educated on the multiple health risks of obesity as well as the benefit of weight loss to improve her health. She was advised of the need for long term treatment and the importance of lifestyle modifications to improve her current health and to decrease her risk of future health problems.  AGREE: Multiple dietary modification options and treatment options were discussed and Chrishana agreed to follow the recommendations documented in the above note.  ARRANGE: Rylynn was educated on the importance of frequent visits to treat obesity as outlined per CMS and USPSTF guidelines and agreed to  schedule her next follow up appointment today.  Attestation Statements:   Reviewed by clinician on day of visit: allergies, medications, problem list, medical history, surgical history, family history, social history, and previous encounter notes.   I, [name], am acting as transcriptionist for Dennard Nip, MD.  I have reviewed the above documentation for accuracy and completeness, and I agree with the above. -  Dennard Nip, MD

## 2019-10-11 NOTE — Telephone Encounter (Signed)
Refill protocol sent to [name]

## 2019-10-12 ENCOUNTER — Other Ambulatory Visit (INDEPENDENT_AMBULATORY_CARE_PROVIDER_SITE_OTHER): Payer: Self-pay | Admitting: Family Medicine

## 2019-10-12 DIAGNOSIS — F3289 Other specified depressive episodes: Secondary | ICD-10-CM

## 2019-10-12 NOTE — Telephone Encounter (Signed)
Refill protocol sent to [name]

## 2019-10-13 ENCOUNTER — Ambulatory Visit (INDEPENDENT_AMBULATORY_CARE_PROVIDER_SITE_OTHER): Payer: Medicare PPO | Admitting: Orthotics

## 2019-10-13 ENCOUNTER — Other Ambulatory Visit: Payer: Self-pay

## 2019-10-13 DIAGNOSIS — E1142 Type 2 diabetes mellitus with diabetic polyneuropathy: Secondary | ICD-10-CM

## 2019-10-13 DIAGNOSIS — L84 Corns and callosities: Secondary | ICD-10-CM | POA: Diagnosis not present

## 2019-10-13 DIAGNOSIS — E1121 Type 2 diabetes mellitus with diabetic nephropathy: Secondary | ICD-10-CM | POA: Diagnosis not present

## 2019-10-13 DIAGNOSIS — M21372 Foot drop, left foot: Secondary | ICD-10-CM | POA: Diagnosis not present

## 2019-10-13 DIAGNOSIS — M21371 Foot drop, right foot: Secondary | ICD-10-CM

## 2019-10-13 DIAGNOSIS — M2011 Hallux valgus (acquired), right foot: Secondary | ICD-10-CM | POA: Diagnosis not present

## 2019-10-13 DIAGNOSIS — R296 Repeated falls: Secondary | ICD-10-CM | POA: Diagnosis not present

## 2019-10-13 DIAGNOSIS — M25672 Stiffness of left ankle, not elsewhere classified: Secondary | ICD-10-CM | POA: Diagnosis not present

## 2019-10-13 DIAGNOSIS — M2041 Other hammer toe(s) (acquired), right foot: Secondary | ICD-10-CM | POA: Diagnosis not present

## 2019-10-13 DIAGNOSIS — R269 Unspecified abnormalities of gait and mobility: Secondary | ICD-10-CM | POA: Diagnosis not present

## 2019-10-13 DIAGNOSIS — M6259 Muscle wasting and atrophy, not elsewhere classified, multiple sites: Secondary | ICD-10-CM | POA: Diagnosis not present

## 2019-10-13 NOTE — Progress Notes (Signed)
Patient had appointment today for definitive and final diabetic shoe fitting and delivery.  Patient was seen byRIck, C.Ped, O[facility].   The inserts fit well and accomplished full contact with the plantar surface of the foot bilateral; the shoes fit well and offered forefoot freedom, no noticible heel slippage, and good toe clearance w/ the insert in place.  Patient was advised to monitor of any skin irritation, breakdown.  Patient was satisfied with fit and function.

## 2019-10-13 NOTE — Telephone Encounter (Signed)
Refill protocol sent to [name]

## 2019-10-15 DIAGNOSIS — M6259 Muscle wasting and atrophy, not elsewhere classified, multiple sites: Secondary | ICD-10-CM | POA: Diagnosis not present

## 2019-10-15 DIAGNOSIS — M25672 Stiffness of left ankle, not elsewhere classified: Secondary | ICD-10-CM | POA: Diagnosis not present

## 2019-10-15 DIAGNOSIS — R296 Repeated falls: Secondary | ICD-10-CM | POA: Diagnosis not present

## 2019-10-15 DIAGNOSIS — R269 Unspecified abnormalities of gait and mobility: Secondary | ICD-10-CM | POA: Diagnosis not present

## 2019-10-18 ENCOUNTER — Ambulatory Visit (INDEPENDENT_AMBULATORY_CARE_PROVIDER_SITE_OTHER): Payer: Medicare PPO | Admitting: Neurology

## 2019-10-18 DIAGNOSIS — Z794 Long term (current) use of insulin: Secondary | ICD-10-CM

## 2019-10-18 DIAGNOSIS — C50911 Malignant neoplasm of unspecified site of right female breast: Secondary | ICD-10-CM

## 2019-10-18 DIAGNOSIS — Z9989 Dependence on other enabling machines and devices: Secondary | ICD-10-CM

## 2019-10-18 DIAGNOSIS — Z9884 Bariatric surgery status: Secondary | ICD-10-CM

## 2019-10-18 DIAGNOSIS — G4733 Obstructive sleep apnea (adult) (pediatric): Secondary | ICD-10-CM

## 2019-10-18 DIAGNOSIS — E1140 Type 2 diabetes mellitus with diabetic neuropathy, unspecified: Secondary | ICD-10-CM

## 2019-10-25 DIAGNOSIS — E785 Hyperlipidemia, unspecified: Secondary | ICD-10-CM | POA: Diagnosis not present

## 2019-10-25 LAB — HEPATIC FUNCTION PANEL
ALT: 13 IU/L (ref 0–32)
AST: 28 IU/L (ref 0–40)
Albumin: 4.4 g/dL (ref 3.8–4.8)
Alkaline Phosphatase: 65 IU/L (ref 44–121)
Bilirubin Total: 0.5 mg/dL (ref 0.0–1.2)
Bilirubin, Direct: 0.2 mg/dL (ref 0.00–0.40)
Total Protein: 6.4 g/dL (ref 6.0–8.5)

## 2019-10-25 LAB — LIPID PANEL
Chol/HDL Ratio: 1.5 ratio (ref 0.0–4.4)
Cholesterol, Total: 88 mg/dL — ABNORMAL LOW (ref 100–199)
HDL: 58 mg/dL (ref >39)
LDL Chol Calc (NIH): 18 mg/dL (ref 0–99)
Triglycerides: 48 mg/dL (ref 0–149)
VLDL Cholesterol Cal: 12 mg/dL (ref 5–40)

## 2019-10-26 DIAGNOSIS — H43811 Vitreous degeneration, right eye: Secondary | ICD-10-CM | POA: Diagnosis not present

## 2019-10-26 DIAGNOSIS — H3582 Retinal ischemia: Secondary | ICD-10-CM | POA: Diagnosis not present

## 2019-10-26 DIAGNOSIS — E113512 Type 2 diabetes mellitus with proliferative diabetic retinopathy with macular edema, left eye: Secondary | ICD-10-CM | POA: Diagnosis not present

## 2019-10-26 DIAGNOSIS — H35371 Puckering of macula, right eye: Secondary | ICD-10-CM | POA: Diagnosis not present

## 2019-10-26 DIAGNOSIS — E113513 Type 2 diabetes mellitus with proliferative diabetic retinopathy with macular edema, bilateral: Secondary | ICD-10-CM | POA: Diagnosis not present

## 2019-10-26 NOTE — Progress Notes (Signed)
  Summary & Diagnosis:     The patient presented now with mild OSA at AHI of 13.2/h, non  -REM dependent sleep apnea. There is no tachy-bradycardia noted,  no hypoxia and the AHI is mostly supine positional dependent  (AHI- apnea hypopnea Index- is over 20/h in supine ) .  Recommendations:    The patient needs to avoid the supine sleep position- that alone  can treat her apnea. If that positional change is not possible, I  would recommend a dental device to treat mild apnea and snoring  or CPAP as another option, but not the only choice in treatment  of uncomplicated sleep apnea. Diabetic macular edema can benefit from CPAP therapy- this part of the medical history was not copied to the sleep study info-  The patient had recently asked for a different mask- if that set-  up is well tolerated, she can continue to use CPAP and I will set  the pressures close to her current one.  Please let me know what Mrs Darlene [name]'s preference is !   Cc Dres Hassell Done, and NP Ward Givens, and note to ophthalmologist/ retinal specialist

## 2019-10-26 NOTE — Progress Notes (Signed)
[place]  [phone] [phone]    ID: Darlene [name] DOB: 1950-04-24  MR#: [id number]  URK#:[id number]  Patient Care Team: Vernie Shanks, MD as PCP - General (Family Medicine) Larey Dresser, MD as PCP - Advanced Heart Failure (Cardiology) Stark Klein, MD as Consulting Physician (General Surgery) Lennon Richins, Virgie Dad, MD as Consulting Physician (Oncology) Jacelyn Pi, MD as Consulting Physician (Endocrinology) [name], MD as Consulting Physician (Plastic Surgery) Gery Pray, MD as Consulting Physician (Radiation Oncology) Juanita Craver, MD as Consulting Physician (Gastroenterology) Alda Berthold, DO as Consulting Physician (Neurology) Starlyn Skeans, MD as Consulting Physician (Family Medicine) Sherlynn Stalls, MD as Consulting Physician (Ophthalmology) Salvadore Dom, MD as Consulting Physician (Obstetrics and Gynecology)  OTHER MD:   CHIEF COMPLAINT:  HER-2 positive, estrogen receptor negative breast cancer  CURRENT TREATMENT:  Trastuzumab; Pertuzumab   INTERVAL HISTORY:  Darlene [name] returns today for follow-up and treatment of her estrogen receptor negative but HER-2 amplified breast cancer.  Her husband [name] was not able to come.  He is having significant issues related to his prostate cancer diagnosis and treatment  She continues on Trastuzumab/Pertuzumab every 4 weeks.  She is tolerating this well, specifically with no diarrhea problems.  Her most recent echocardiogram from 07/01/2019 showed an ejection fraction of 55-60%. Grade I diastolic dysfunction was noted.  Her next echocardiogram is scheduled for November 03, 2019.   REVIEW OF SYSTEMS: Darlene [name] has finally gotten her weight where she wanted and her most recent A1c was 5.2 which is extraordinary.  Unfortunately she has severe peripheral neuropathy which is probably what is bothering her the most.  Her port is working well.  She has normal activity for her.   A detailed review of systems today was otherwise noncontributory   BREAST CANCER HISTORY: From the original intake note:  "Darlene [name]" had screening mammography at her gynecologist suggestive of a possible abnormality in the right breast. However right diagnostic mammogram 04/12/2013 at [facility] was negative. More recently however, she noted a lump in her right breast and brought it to her gynecologist's attention. On 07/18/2014 the patient had bilateral diagnostic mammography with tomosynthesis at [facility]. The breast density was category B. In the right breast upper outer quadrant there was an area of focal asymmetry with indistinct margins.[name] Ultrasound was obtained and showed in addition to the mass in question, measuring 1.7 cm, an abnormal-appearing lymph node in the right axillary tail.  On 07/19/2014 the patient underwent biopsy of the right breast massin question as well as a right axillary lymph node. Both were positive for invasive ductal carcinoma, grade 2, estrogen and progesterone receptor negative, with an MIB-1 of 90%, and HER-2 pending. Incidentally the lymph node biopsy showed a lymphocytic inflammatory component but no lymph node architecture.  Her subsequent history is as detailed above.   PAST MEDICAL HISTORY: Past Medical History:  Diagnosis Date  . Anemia    hx of - during 1st round chemo  . Anxiety   . Arthritis    in fingers, shoulders  . Back pain   . Balance problem   . Breast cancer ([place])   . Breast cancer of upper-outer quadrant of right female breast ([place]) 07/22/2014  . Bunion, right foot   . Chronic kidney disease    creatininei levels high per pt   . Clumsiness   . Constipation   . Depression   . Diabetes mellitus without complication ([place])    83+ years type 2   .  Diabetic retinopathy ([place])    torn retina  . Eating disorder    binge eating  . Epistaxis 08/26/2014  . Fatty liver   . Foot drop, left foot   . HCAP (healthcare-associated pneumonia) 12/18/2014  .  Heart murmur    never had any problems  . History of radiation therapy 04/05/15-05/19/15   right chest wall was treated to 50.4 Gy in 28 fractions, right mastectomy scar was treated to 10 Gy in 5 fractions  . Hyperlipidemia   . Hypertension   . Joint pain   . Multiple food allergies   . Neuromuscular disorder ([place])    diabetic neuropathy  . Obesity   . Obstructive sleep apnea on CPAP    does not use cpap all the time has lost 160 lbs   . Ovarian cyst rupture    possible  . Pneumonia   . Pneumonia 11/2014  . Radiation 04/05/15-05/19/15   right chest wall 50.4 Gy, mastectomy scar 10 Gy  . Sleep apnea   . Thyroid nodule 06/2014    PAST SURGICAL HISTORY: Past Surgical History:  Procedure Laterality Date  . APPENDECTOMY    . BIOPSY THYROID Bilateral 06/2014   2 nodules - Benign   . BREAST LUMPECTOMY WITH RADIOACTIVE SEED AND SENTINEL LYMPH NODE BIOPSY Right 12/27/2014   Procedure: BREAST LUMPECTOMY WITH RADIOACTIVE SEED AND SENTINEL LYMPH NODE BIOPSY;  Surgeon: Stark Klein, MD;  Location: [place];  Service: General;  Laterality: Right;  . BREAST REDUCTION SURGERY Bilateral 01/06/2015   Procedure: BILATERAL BREAST REDUCTION, RIGHT ONCOPLASTIC RECONSTRUCTION),LEFT BREAST REDUCTION FOR SYMMETRY;  Surgeon: [name], MD;  Location: [place];  Service: Plastics;  Laterality: Bilateral;  . BREATH TEK H PYLORI N/A 09/15/2012   Procedure: BREATH TEK H PYLORI;  Surgeon: Pedro Earls, MD;  Location: [facility];  Service: General;  Laterality: N/A;  . BUNIONECTOMY Left 12/2013  . CARPAL TUNNEL RELEASE Right   . CARPAL TUNNEL RELEASE Left   . CATARACT EXTRACTION     6/19  . COLONOSCOPY    . DUPUYTREN CONTRACTURE RELEASE    . GASTRIC ROUX-EN-Y N/A 11/02/2012   Procedure: LAPAROSCOPIC ROUX-EN-Y GASTRIC;  Surgeon: Pedro Earls, MD;  Location: [facility];  Service: General;  Laterality: N/A;  . IR GENERIC HISTORICAL  03/18/2016   IR US GUIDE VASC ACCESS LEFT 03/18/2016 Markus Daft, MD [facility]  . IR GENERIC HISTORICAL  03/18/2016   IR FLUORO GUIDE CV LINE LEFT 03/18/2016 Markus Daft, MD [facility]  . LAPAROSCOPIC APPENDECTOMY N/A 07/22/2015   Procedure: APPENDECTOMY LAPAROSCOPIC;  Surgeon: Michael Boston, MD;  Location: [facility];  Service: General;  Laterality: N/A;  . MASTECTOMY Right 02/15/2015   modified  . MODIFIED MASTECTOMY Right 02/15/2015   Procedure: RIGHT MODIFIED RADICAL MASTECTOMY;  Surgeon: Stark Klein, MD;  Location: [place];  Service: General;  Laterality: Right;  . ORIF WRIST FRACTURE Left 05/11/2019   Procedure: OPEN REDUCTION INTERNAL FIXATION (ORIF) left distal radius fracture;  Surgeon: Renette Butters, MD;  Location: [facility];  Service: Orthopedics;  Laterality: Left;  with block  . PORT-A-CATH REMOVAL Left 10/10/2015   Procedure: PORT REMOVAL;  Surgeon: Stark Klein, MD;  Location: [place];  Service: General;  Laterality: Left;  . PORTACATH PLACEMENT Left 08/02/2014   Procedure: INSERTION PORT-A-CATH;  Surgeon: Stark Klein, MD;  Location: [place];  Service: General;  Laterality: Left;  . PORTACATH PLACEMENT N/A 11/05/2016   Procedure: INSERTION PORT-A-CATH;  Surgeon: Stark Klein,  MD;  Location: [place];  Service: General;  Laterality: N/A;  . RETINAL LASER PROCEDURE     retina tear on left eye  . ROTATOR CUFF REPAIR Right 2005  . SCAR REVISION Right 12/08/2015   Procedure: COMPLEX REPAIR RIGHT CHEST 10-15CM;  Surgeon: [name], MD;  Location: [place];  Service: Plastics;  Laterality: Right;  . TOE SURGERY  1970s   bone spur  . TRIGGER FINGER RELEASE     x3    FAMILY HISTORY Family History  Problem Relation Age of Onset  . CVA Mother   . Heart disease Mother   . Sudden death Mother   . Diabetes Father   . Heart failure Father   . Hypertension Father   . Kidney disease Father   . Obesity Father   . Hypertension Sister   . Diabetes Brother   . Breast cancer Paternal Grandmother 41  . Diabetes  Paternal Uncle   . Ovarian cancer Neg Hx    The patient's father died from complications of diabetes at age 58. The patient's mother died at age 34 with a subarachnoid hemorrhage. The patient had one brother, one sister. The only breast cancer was the patient's paternal grandmother diagnosed in her 45s. There is no history of ovarian cancer in the family.   GYNECOLOGIC HISTORY:  Patient's last menstrual period was 10/22/2007.  menarche age 7, the patient is GX P0. She went through the change of life in 2011. She did not take hormone replacement.   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 Her husband [name] is a retired Visual merchandiser (he taught at [employer]).  In May 2020 they moved to [facility] in [place].  [name] has 2 children from an earlier marriage, Rockey Situ who teaches in [place], and Jaelen Gellerman,  who works at the [employer] in in [place]. He has 1 grand son, aged 48. The patient and her husband are not church attenders.   ADVANCED DIRECTIVES:  Not in place   HEALTH MAINTENANCE: Social History   Tobacco Use  . Smoking status: Never Smoker  . Smokeless tobacco: Never Used  Vaping Use  . Vaping Use: Never used  Substance Use Topics  . Alcohol use: Yes    Alcohol/week: 1.0 - 2.0 standard drink    Types: 1 - 2 Standard drinks or equivalent per week    Comment: social  . Drug use: No     Colonoscopy:  May 2016  PAP:  Bone density: 07/01/2016 at [facility] showed normal, with a T score of -0.7  Lipid panel:  Allergies  Allergen Reactions  . Nsaids Other (See Comments)    H/o gastric bypass - avoid NSAIDs  . Tape Hives, Rash and Other (See Comments)    Adhesives in bandaids    Current Outpatient Medications  Medication Sig Dispense Refill  . Accu-Chek Softclix Lancets lancets 1 each by  Other route 4 (four) times daily. Test blood sugars 4 x times daily 100 each 1  . aspirin EC 81 MG tablet Take 1 tablet (81 mg total) by mouth daily. 90 tablet 3  . Bempedoic Acid-Ezetimibe (NEXLIZET) 180-10 MG TABS Take 1 tablet by mouth daily. 90 tablet 3  . Blood Glucose Calibration (ACCU-CHEK GUIDE CONTROL) LIQD 1 Bottle by In Vitro route as needed. 1 each 0  . Blood  Glucose Monitoring Suppl (ACCU-CHEK GUIDE) w/Device KIT 1 each by Does not apply route 4 (four) times daily. Use to test blood sugars 4x a day 1 kit 0  . buPROPion (WELLBUTRIN SR) 200 MG 12 hr tablet TAKE 1 TABLET AT 12 NOON. 90 tablet 0  . CALCIUM CITRATE-VITAMIN D PO Take 1 tablet by mouth 3 (three) times daily.     . carvedilol (COREG) 3.125 MG tablet TAKE 1 TABLET BY MOUTH TWICE DAILY. 180 tablet 3  . cholecalciferol (VITAMIN D) 1000 units tablet Take 1,000 Units by mouth daily.    . cholestyramine (QUESTRAN) 4 g packet Take 1 packet (4 g total) by mouth 2 (two) times daily as needed. 10 each 12  . Cyanocobalamin (VITAMIN B-12 PO) Take 1 tablet by mouth every 14 (fourteen) days.    . diphenhydrAMINE (BENADRYL) 25 MG tablet Take 25 mg by mouth daily as needed for allergies or sleep.     [name] glucose blood (ACCU-CHEK GUIDE) test strip 1 each by Other route 4 (four) times daily. Accu Chek Guide  Test blood sugars 4 times a day 100 each 1  . insulin glargine (LANTUS SOLOSTAR) 100 UNIT/ML Solostar Pen Inject 10 Units into the skin daily. 9 mL 0  . liraglutide (VICTOZA) 18 MG/3ML SOPN Pt to take 1.8 mg qam 9 mL 2  . loratadine (CLARITIN) 10 MG tablet Take 10 mg by mouth as needed for allergies.    [name] losartan (COZAAR) 100 MG tablet Take 1 tablet (100 mg total) by mouth daily. 90 tablet 0  . ondansetron (ZOFRAN) 4 MG tablet Take 1 tablet (4 mg total) by mouth every 8 (eight) hours as needed for nausea or vomiting. 20 tablet 0  . Pediatric Multivitamins-Iron (FLINTSTONES PLUS IRON) chewable tablet Chew 1 tablet by mouth 2 (two) times  daily.    . Probiotic Product (CVS PROBIOTIC PO) Take 1 capsule by mouth daily.     . prochlorperazine (COMPAZINE) 10 MG tablet Take 1 tablet (10 mg total) by mouth every 6 (six) hours as needed for nausea or vomiting. 30 tablet 0  . Propylene Glycol (SYSTANE BALANCE OP) Place 1-2 drops into both eyes 3 (three) times daily as needed (for dry eyes).     [name] senna-docusate (SENNA PLUS) 8.6-50 MG tablet Take 1 tablet by mouth daily as needed for moderate constipation.     . SURE COMFORT PEN NEEDLES 32G X 4 MM MISC USE WITH INJECTIONS 100 each 0  . topiramate (TOPAMAX) 25 MG tablet Take 1 tablet (25 mg total) by mouth daily. 90 tablet 0  . TRINTELLIX 20 MG TABS tablet TAKE ONE TABLET AT BEDTIME. 90 tablet 0   No current facility-administered medications for this visit.    OBJECTIVE: White woman who appears stated age  69:   10/27/19 0956  BP: 125/68  Pulse: (!) 58  Resp: 18  Temp: (!) 97.1 F (36.2 C)  SpO2: 100%     Body mass index is 25.86 kg/m.     ECOG FS:1 - Symptomatic but completely ambulatory   Filed Weights   10/27/19 0956  Weight: 175 lb 1.6 oz (79.4 kg)     Sclerae unicteric, EOMs intact Wearing a mask No cervical or supraclavicular adenopathy Lungs no rales or rhonchi Heart regular rate and rhythm Abd soft, nontender, positive bowel sounds MSK no focal spinal tenderness, no upper extremity lymphedema Neuro: nonfocal, well oriented, appropriate affect Breasts: The right breast is status post mastectomy.  There is no evidence of  chest wall recurrence.  The left breast is benign.  Both axillae are benign.   LAB RESULTS:  CMP     Component Value Date/Time   NA 142 10/27/2019 0942   NA 146 (H) 01/25/2019 0957   NA 140 01/07/2017 0758   K 3.8 10/27/2019 0942   K 4.1 01/07/2017 0758   CL 112 (H) 10/27/2019 0942   CO2 24 10/27/2019 0942   CO2 24 01/07/2017 0758   GLUCOSE 99 10/27/2019 0942   GLUCOSE 134 01/07/2017 0758   BUN 36 (H) 10/27/2019 0942   BUN 25  01/25/2019 0957   BUN 33.6 (H) 01/07/2017 0758   CREATININE 1.88 (H) 10/27/2019 0942   CREATININE 1.62 (H) 04/28/2018 0915   CREATININE 1.5 (H) 01/07/2017 0758   CALCIUM 9.2 10/27/2019 0942   CALCIUM 9.3 01/07/2017 0758   PROT 6.2 (L) 10/27/2019 0942   PROT 6.4 10/25/2019 1116   PROT 6.7 01/07/2017 0758   ALBUMIN 3.6 10/27/2019 0942   ALBUMIN 4.4 10/25/2019 1116   ALBUMIN 3.6 01/07/2017 0758   AST 22 10/27/2019 0942   AST 43 (H) 04/28/2018 0915   AST 51 (H) 01/07/2017 0758   ALT 13 10/27/2019 0942   ALT 61 (H) 04/28/2018 0915   ALT 82 (H) 01/07/2017 0758   ALKPHOS 56 10/27/2019 0942   ALKPHOS 115 01/07/2017 0758   BILITOT 0.5 10/27/2019 0942   BILITOT 0.5 10/25/2019 1116   BILITOT 0.6 04/28/2018 0915   BILITOT 0.72 01/07/2017 0758   GFRNONAA 27 (L) 10/27/2019 0942   GFRNONAA 33 (L) 04/28/2018 0915   GFRNONAA 55 (L) 02/18/2013 0827   GFRAA 31 (L) 09/28/2019 1017   GFRAA 38 (L) 04/28/2018 0915   GFRAA 63 02/18/2013 0827    INo results found for: SPEP, UPEP  Lab Results  Component Value Date   WBC 4.2 10/27/2019   NEUTROABS 2.9 10/27/2019   HGB 9.7 (L) 10/27/2019   HCT 28.8 (L) 10/27/2019   MCV 94.7 10/27/2019   PLT 153 10/27/2019      Chemistry      Component Value Date/Time   NA 142 10/27/2019 0942   NA 146 (H) 01/25/2019 0957   NA 140 01/07/2017 0758   K 3.8 10/27/2019 0942   K 4.1 01/07/2017 0758   CL 112 (H) 10/27/2019 0942   CO2 24 10/27/2019 0942   CO2 24 01/07/2017 0758   BUN 36 (H) 10/27/2019 0942   BUN 25 01/25/2019 0957   BUN 33.6 (H) 01/07/2017 0758   CREATININE 1.88 (H) 10/27/2019 0942   CREATININE 1.62 (H) 04/28/2018 0915   CREATININE 1.5 (H) 01/07/2017 0758      Component Value Date/Time   CALCIUM 9.2 10/27/2019 0942   CALCIUM 9.3 01/07/2017 0758   ALKPHOS 56 10/27/2019 0942   ALKPHOS 115 01/07/2017 0758   AST 22 10/27/2019 0942   AST 43 (H) 04/28/2018 0915   AST 51 (H) 01/07/2017 0758   ALT 13 10/27/2019 0942   ALT 61 (H) 04/28/2018  0915   ALT 82 (H) 01/07/2017 0758   BILITOT 0.5 10/27/2019 0942   BILITOT 0.5 10/25/2019 1116   BILITOT 0.6 04/28/2018 0915   BILITOT 0.72 01/07/2017 0758       No results found for: LABCA2  No components found for: LABCA125  No results for input(s): INR in the last 168 hours.  Urinalysis    Component Value Date/Time   COLORURINE YELLOW 07/21/2015 [place] 07/21/2015 2328   LABSPEC 1.020 07/21/2015  [place] 6.0 07/21/2015 2328   GLUCOSEU NEGATIVE 07/21/2015 2328   HGBUR TRACE (A) 07/21/2015 [place] 07/21/2015 2328   BILIRUBINUR neg 06/29/2013 [place] 07/21/2015 2328   PROTEINUR 30 (A) 07/21/2015 2328   UROBILINOGEN negative 06/29/2013 1017   NITRITE NEGATIVE 07/21/2015 2328   LEUKOCYTESUR NEGATIVE 07/21/2015 2328    STUDIES: Home sleep test  Result Date: 10/18/2019 Larey Seat, MD     10/26/2019  5:13 PM Sleep Study Report Patient Information    First Name: Otelia Last Name: Quiett ID: [id number] Birth Date: [birth year] Age: 25 Gender: Female Referring Provider: Vernie Shanks, MD BMI: 25.5 (W=172 lb, H=5' 9'')   [place]:  3/24  Sleep Study Information   Study Date: 10/18/19 S/H/A Version: [id number] / 4.2.1023 / 79 History:   10-06-2019; MM Mrs. Sway L. Almario is a 69 year old female with a history of obstructive sleep apnea on CPAP, history of bariatric surgery, DM , and is followed with regular ECHO cardiograms- she would now be in need of a new CPAP machine .  The patient reports that over the last year she stopped using the CPAP for several different reasons: she was having trouble with her mask.  She also fell and broke both wrists which made it hard to put her mask on.  She also states that she has been working with healthy weight and wellness and has continued to lose weight.  When she began seeing Dr. Brett Fairy in 2015 she weighed 237 pounds.  She now weighs 172 pounds and follows [name]. Here to see  if CPAP is still necessary. Summary & Diagnosis:    The patient presented now with mild OSA at AHI of 13.2/h, non -REM dependent sleep apnea. There is no tachy-bradycardia noted, no hypoxia and the AHI is mostly supine positional dependent (AHI- apnea hypopnea Index- is over 20/h in supine ) . Recommendations:    The patient needs to avoid the supine sleep position- that alone can treat her apnea. If that positional change is not possible, I would recommend a dental device to treat mild apnea and snoring or CPAP as another option, but not the only choice in treatment of uncomplicated sleep apnea. The patient had recently asked for a different mask- if that set- up is well tolerated, she can continue to use CPAP and I will set the pressures close to her current one. Please let me know. Cc Dres Hassell Done, and NP Ward Givens Interpreting Physician: Larey Seat, MD Sleep Summary  Oxygen Saturation Statistics Start Study Time: End Study Time: Total Recording Time:          11:27:39 PM 8:34:27 AM 9 h, 6 min Total Sleep Time % REM of Sleep Time:  7 h, 28 min 23.5  Mean: 94 Minimum: 84 Maximum: 98 Mean of Desaturations Nadirs (%):   91 Oxygen Desaturation. %:  4-9 10-20 >20 Total Events Number Total   50  2 96.2 3.8  0 0.0  52 100.0 Oxygen Saturation: <90 <=88 <85 <80 <70 Duration (minutes): Sleep % 3.9 0.9 2.1 0.1 0.5 0.0 0.0 0.0 0.0 0.0  Respiratory Indices   Total Events REM NREM All Night pRDI: pAHI 3%: ODI 4%: pAHIc 3%: % CSR: pAHI 4%: 106  98  52  13 0.0 50 12.7 10.4 4.0 2.3 14.8 14.1 7.9 1.6 14.3 13.2 7.0 1.8 6.7    Pulse Rate Statistics during Sleep (BPM)   Mean: 68 Minimum: 50 Maximum:  88  Indices are calculated using technically valid sleep time of 7 h, 25 min.                   pAHI=13.2                                Mild              Moderate                    Severe                                                 _0 ASSESSMENT: 69 y.o. [place]  woman status post right breast upper outer quadrant and right axillary lymph node biopsy 07/19/2014 for a cT2  pN1, stage IIB  invasive ductal carcinoma, grade 2, estrogen and progesterone receptor negative, with an MIB-1 of 90%, and HER-2 amplified  (1) neoadjuvant chemotherapy started 08/08/2014, consisting of carboplatin, docetaxel, trastuzumab and pertuzumab given every 3 weeks 6, completed 11/21/2014  (a) breast MRI after initial 3 cycles shows a significant response  (b) breast MRI after 6 cycles shows a complete radiologic response   (2) trastuzumab continued through 10/02/2015 (to end of capecitabine therapy)  (a) echo 02/13/2015 shows an EF of 60-65%  (b)  echo 05/09/2015 shows an ejection fraction of 60-65%  (c) echocardiogram 06/27/2015 shows an ejection fraction of 55-60%.  (d) echocardiogram 01/22/2017 shows an ejection fraction of 55-60%  (3) right lumpectomy with sentinel lymph node biopsy on 12/27/14 showed a residual  ypT2 ypN1a, stage IIB invasive ductal carcinoma, grade 2, with repeat estrogen and progesterone receptors again negative  (4) status post right oncoplastic surgery (with left reduction mammoplasty) 01/06/2015 showing in the right breast multiple foci of intralymphatic carcinoma in random sections  (5)  Right modified radical mastectomy 02/15/2014 showed no residual invasive disease; there was DCIS but margins were negative; all 15 axillary lymph nodes were clear; ypTis ypN0  (a)  The patient has decided against reconstruction.  (6)  Postmastectomy radiation completed 05/18/2015 with capecitabine sensitization  (7) adjuvant capecitabine continued through 09/27/2015  (8) hepatic steatosis with increased fibrosis score (at risk for cirrhosis)  (9) thyroid biopsy (left lobe inferiorly) 2 shows benign tissue 06/28/2015  (10) LOCAL RECURRENCE:  (a) PET scan 12/06/2015 Notes a right retropectoral lymph node measuring 0.9 cm with an SUV max of 5.5  (b) repeat PET  scan 03/11/2016 finds the right subpectoral lymph node to now measure 1.3 cm with an SUV max of 11.8.  (c) PET scan 05/07/2016 shows a significant response to T-DM 1  (d) PET scan 07/10/2017 is clear except for what appears to be a single new liver lesion   (11) started T-DM1 03/21/2016, completed 8 doses 08/13/2016  (a) PET scan 08/19/2016 shows a good response but measurable residual disease  (b) PET scan on 01/16/2017 shows a continuing response  (12) maintenance trastuzumab started 09/03/2016--changed to every 28 days as of 01/28/2017  (a) repeat echocardiogram 07/08/2016 shows an ejection fraction of 60-65%.  (  b) echocardiogram on 10/08/2016 demonstrates LVEF of 55-60% (followed by Dr. Haroldine Laws)  (c) echo 01/22/2017 shows an ejection fraction in the 55-60% range  (d) echo 04/24/2017 shows an ejection fraction in the 55-60% range (recommendation for echo every 6 months by Dr. Aundra Dubin)  (e) trastuzumab changed to T-DM 1 after trastuzumab dose 07/15/2017  METASTATIC DISEASE: July 2019 (13)  Radiation to the area of persistent disease in the right upper chest completed 10/30/2016   (14) T-DM 1 resumed 08/12/2017, repeated every 21 days  (a) baseline liver MRI 08/02/2017 shows a 1.4 cm mass, "hot" on PET 07/10/2017  (b) repeat liver MRI 11/18/2017 after 3 cycles of T-DM 1, shows slight enlargement  (c) T-DM 1 discontinued after 09/23/2017 dose  (15) trastuzumab resumed 11/18/2017, to be repeated every 4 weeks,   (a) changed back to every 3 weeks in 03/2018  (b) echo 09/18/2018 shows an ejection fraction in the 50-55% range.  (c) pertuzumab added with 10/06/2018 dose  (d) trastuzumab/pertuzumab changed to every 4 weeks after 01/19/2019 dose  (e) liver MRI 04/27/2019 shows mild sclerosis, no change in right hepatic lobe treated metastases, with no new lesions; prominent porta hepatis nodes were felt likely reactive  (f) abdominal ultrasound 06/28/2027 shows no focal liver lesions, no  ascites  (g) liver MRI 09/20/2019 shows stable disease (0.6 cm).  (16) liver biopsy 11/03/2017 shows adenocarcinoma, most consistent with an upper gastrointestinal primary; this tumor was estrogen receptor and HER-2 negative  (a) GI workup for occult primary ([place]) negative  (b) PET 12/09/2017 shows no obvious primary.  There is a right subpectoral lymph node noted as well  (c) CA 27-29 and CA 19-9 uninformative; CEA 6.72 OCT 2019  (d) negative hepatitis B serologies May 2021    (17) started Abraxane on 12/111/2019, given days 1 and 8 on a 21 day cycle   (a) restaging with MRI liver 02/24/2018 shows a gratifying initial response  (b) PET scan 05/14/2018 shows resolution of the measurable disease in the liver  (c) liver MRI 09/14/2018 shows no change compared to prior PET and MRI.  (d) Abraxane discontinued after 09/01/2018 dose    PLAN: Darlene [name] is now just over 2 years out from definitive diagnosis of metastatic breast cancer.  Her disease is remarkably well controlled and she has no symptoms related to it.  She is also tolerating her treatment well.  Accordingly the plan is to continue the trastuzumab and Pertuzumab every 4 weeks indefinitely, until there is evidence of disease progression.  She is already scheduled for repeat echocardiogram later this month.  I am scheduling her for left screening mammography sometime in November.  She will have a repeat liver ultrasound in December and see me in January.  She will have a repeat liver MRI sometime in March  Otherwise I commended her excellent diabetes control and encouraged her to exercise as tolerated.    Total encounter time 25 minutes.Sarajane Jews C. Sapir Lavey, MD 10/27/19 6:41 PM Medical Oncology and Hematology [facility] [place], [place] [address] Tel. [phone]    Fax. [phone]   I, Wilburn Mylar, am acting as scribe for Dr. Virgie Dad. Jolanda Mccann.  I, Lurline Del MD, have reviewed the  above documentation for accuracy and completeness, and I agree with the above.   *Total Encounter Time as defined by the Centers for Medicare and Medicaid Services includes, in addition to the face-to-face time of a patient visit (documented in the note above) non-face-to-face time: obtaining  and reviewing outside history, ordering and reviewing medications, tests or procedures, care coordination (communications with other health care professionals or caregivers) and documentation in the medical record.

## 2019-10-26 NOTE — Procedures (Signed)
Sleep Study Report   Patient Information     First Name: Darlene Last Name: [name] ID: [id number]  Birth Date: [birth year] Age: 69 Gender: Female  Referring Provider: Vernie Shanks, MD BMI: 25.5 (W=172 lb, H=5' 9'')    Epworth:  3/24   Sleep Study Information    Study Date: 10/18/19 S/H/A Version: [id number] / [id number] / 79  History:    10-06-2019; MM [name] is a 69 year old female with a history of obstructive sleep apnea on CPAP, history of bariatric surgery, DM , and is followed with regular ECHO cardiograms- she would now be in need of a new CPAP machine .  The patient reports that over the last year she stopped using the CPAP for several different reasons: she was having trouble with her mask.  She also fell and broke both wrists which made it hard to put her mask on.  She also states that she has been working with healthy weight and wellness and has continued to lose weight.  When she began seeing Dr. Brett Fairy in 2015 she weighed 237 pounds.  She now weighs 172 pounds and follows [name]. Here to see if CPAP is still necessary.      Summary & Diagnosis:      The patient presented now with mild OSA at AHI of 13.2/h, non -REM dependent sleep apnea. There is no tachy-bradycardia noted, no hypoxia and the AHI is mostly supine positional dependent (AHI- apnea hypopnea Index- is over 20/h in supine ) .  Recommendations:     The patient needs to avoid the supine sleep position- that alone can treat her apnea. If that positional change is not possible, I would recommend a dental device to treat mild apnea and snoring or CPAP as another option, but not the only choice in treatment of uncomplicated sleep apnea.  The patient had recently asked for a different mask- if that set- up is well tolerated, she can continue to use CPAP and I will set the pressures close to her current one.  Please let me know.   Cc Dres Hassell Done, and NP Ward Givens  Interpreting Physician:  Larey Seat, MD           Sleep Summary  Oxygen Saturation Statistics   Start Study Time: End Study Time: Total Recording Time:          11:27:39 PM 8:34:27 AM 9 h, 6 min  Total Sleep Time % REM of Sleep Time:  7 h, 28 min  23.5    Mean: 94 Minimum: 84 Maximum: 98  Mean of Desaturations Nadirs (%):   91  Oxygen Desaturation. %:  4-9 10-20 >20 Total  Events Number Total   50  2 96.2 3.8  0 0.0  52 100.0  Oxygen Saturation: <90 <=88 <85 <80 <70  Duration (minutes): Sleep % 3.9 0.9 2.1 0.1 0.5 0.0 0.0 0.0 0.0 0.0     Respiratory Indices      Total Events REM NREM All Night  pRDI: pAHI 3%: ODI 4%: pAHIc 3%: % CSR: pAHI 4%: 106  98  52  13 0.0 50 12.7 10.4 4.0 2.3 14.8 14.1 7.9 1.6 14.3 13.2 7.0 1.8 6.7       Pulse Rate Statistics during Sleep (BPM)      Mean: 68 Minimum: 50 Maximum: 88    Indices are calculated using technically valid sleep time of 7 h, 25 min.  pAHI=13.2                                 Mild              Moderate                    Severe                                                 5              15                    30

## 2019-10-27 ENCOUNTER — Inpatient Hospital Stay: Payer: Medicare PPO

## 2019-10-27 ENCOUNTER — Encounter: Payer: Self-pay | Admitting: Neurology

## 2019-10-27 ENCOUNTER — Other Ambulatory Visit: Payer: Self-pay

## 2019-10-27 ENCOUNTER — Inpatient Hospital Stay: Payer: Medicare PPO | Attending: Oncology

## 2019-10-27 ENCOUNTER — Telehealth: Payer: Self-pay | Admitting: Neurology

## 2019-10-27 ENCOUNTER — Inpatient Hospital Stay (HOSPITAL_BASED_OUTPATIENT_CLINIC_OR_DEPARTMENT_OTHER): Payer: Medicare PPO | Admitting: Oncology

## 2019-10-27 VITALS — BP 125/68 | HR 58 | Temp 97.1°F | Resp 18 | Ht 69.0 in | Wt 175.1 lb

## 2019-10-27 VITALS — BP 135/71 | HR 67 | Temp 97.6°F

## 2019-10-27 DIAGNOSIS — C50411 Malignant neoplasm of upper-outer quadrant of right female breast: Secondary | ICD-10-CM | POA: Diagnosis not present

## 2019-10-27 DIAGNOSIS — C50911 Malignant neoplasm of unspecified site of right female breast: Secondary | ICD-10-CM | POA: Diagnosis not present

## 2019-10-27 DIAGNOSIS — Z23 Encounter for immunization: Secondary | ICD-10-CM | POA: Diagnosis not present

## 2019-10-27 DIAGNOSIS — Z0000 Encounter for general adult medical examination without abnormal findings: Secondary | ICD-10-CM

## 2019-10-27 DIAGNOSIS — C787 Secondary malignant neoplasm of liver and intrahepatic bile duct: Secondary | ICD-10-CM

## 2019-10-27 DIAGNOSIS — Z171 Estrogen receptor negative status [ER-]: Secondary | ICD-10-CM | POA: Diagnosis not present

## 2019-10-27 DIAGNOSIS — Z5112 Encounter for antineoplastic immunotherapy: Secondary | ICD-10-CM | POA: Insufficient documentation

## 2019-10-27 DIAGNOSIS — Z95828 Presence of other vascular implants and grafts: Secondary | ICD-10-CM

## 2019-10-27 DIAGNOSIS — C801 Malignant (primary) neoplasm, unspecified: Secondary | ICD-10-CM

## 2019-10-27 DIAGNOSIS — Z7189 Other specified counseling: Secondary | ICD-10-CM | POA: Diagnosis not present

## 2019-10-27 DIAGNOSIS — C773 Secondary and unspecified malignant neoplasm of axilla and upper limb lymph nodes: Secondary | ICD-10-CM | POA: Diagnosis not present

## 2019-10-27 DIAGNOSIS — E119 Type 2 diabetes mellitus without complications: Secondary | ICD-10-CM | POA: Insufficient documentation

## 2019-10-27 LAB — CBC WITH DIFFERENTIAL/PLATELET
Abs Immature Granulocytes: 0.01 10*3/uL (ref 0.00–0.07)
Basophils Absolute: 0 10*3/uL (ref 0.0–0.1)
Basophils Relative: 1 %
Eosinophils Absolute: 0.1 10*3/uL (ref 0.0–0.5)
Eosinophils Relative: 3 %
HCT: 28.8 % — ABNORMAL LOW (ref 36.0–46.0)
Hemoglobin: 9.7 g/dL — ABNORMAL LOW (ref 12.0–15.0)
Immature Granulocytes: 0 %
Lymphocytes Relative: 21 %
Lymphs Abs: 0.9 10*3/uL (ref 0.7–4.0)
MCH: 31.9 pg (ref 26.0–34.0)
MCHC: 33.7 g/dL (ref 30.0–36.0)
MCV: 94.7 fL (ref 80.0–100.0)
Monocytes Absolute: 0.4 10*3/uL (ref 0.1–1.0)
Monocytes Relative: 8 %
Neutro Abs: 2.9 10*3/uL (ref 1.7–7.7)
Neutrophils Relative %: 67 %
Platelets: 153 10*3/uL (ref 150–400)
RBC: 3.04 MIL/uL — ABNORMAL LOW (ref 3.87–5.11)
RDW: 12.9 % (ref 11.5–15.5)
WBC: 4.2 10*3/uL (ref 4.0–10.5)
nRBC: 0 % (ref 0.0–0.2)

## 2019-10-27 LAB — COMPREHENSIVE METABOLIC PANEL
ALT: 13 U/L (ref 0–44)
AST: 22 U/L (ref 15–41)
Albumin: 3.6 g/dL (ref 3.5–5.0)
Alkaline Phosphatase: 56 U/L (ref 38–126)
Anion gap: 6 (ref 5–15)
BUN: 36 mg/dL — ABNORMAL HIGH (ref 8–23)
CO2: 24 mmol/L (ref 22–32)
Calcium: 9.2 mg/dL (ref 8.9–10.3)
Chloride: 112 mmol/L — ABNORMAL HIGH (ref 98–111)
Creatinine, Ser: 1.88 mg/dL — ABNORMAL HIGH (ref 0.44–1.00)
GFR calc non Af Amer: 27 mL/min — ABNORMAL LOW (ref >60)
Glucose, Bld: 99 mg/dL (ref 70–99)
Potassium: 3.8 mmol/L (ref 3.5–5.1)
Sodium: 142 mmol/L (ref 135–145)
Total Bilirubin: 0.5 mg/dL (ref 0.3–1.2)
Total Protein: 6.2 g/dL — ABNORMAL LOW (ref 6.5–8.1)

## 2019-10-27 LAB — CEA (IN HOUSE-CHCC): CEA (CHCC-In House): 1.34 ng/mL (ref 0.00–5.00)

## 2019-10-27 MED ORDER — SODIUM CHLORIDE 0.9 % IV SOLN
420.0000 mg | Freq: Once | INTRAVENOUS | Status: AC
  Administered 2019-10-27: 420 mg via INTRAVENOUS
  Filled 2019-10-27: qty 14

## 2019-10-27 MED ORDER — INFLUENZA VAC A&B SA ADJ QUAD 0.5 ML IM PRSY
PREFILLED_SYRINGE | INTRAMUSCULAR | Status: AC
  Filled 2019-10-27: qty 0.5

## 2019-10-27 MED ORDER — DIPHENHYDRAMINE HCL 25 MG PO CAPS
ORAL_CAPSULE | ORAL | Status: AC
  Filled 2019-10-27: qty 1

## 2019-10-27 MED ORDER — ACETAMINOPHEN 325 MG PO TABS
650.0000 mg | ORAL_TABLET | Freq: Once | ORAL | Status: AC
  Administered 2019-10-27: 650 mg via ORAL

## 2019-10-27 MED ORDER — SODIUM CHLORIDE 0.9 % IV SOLN
Freq: Once | INTRAVENOUS | Status: AC
  Filled 2019-10-27: qty 250

## 2019-10-27 MED ORDER — ACETAMINOPHEN 325 MG PO TABS
ORAL_TABLET | ORAL | Status: AC
  Filled 2019-10-27: qty 2

## 2019-10-27 MED ORDER — DIPHENHYDRAMINE HCL 25 MG PO CAPS
25.0000 mg | ORAL_CAPSULE | Freq: Once | ORAL | Status: AC
  Administered 2019-10-27: 25 mg via ORAL

## 2019-10-27 MED ORDER — SODIUM CHLORIDE 0.9% FLUSH
10.0000 mL | INTRAVENOUS | Status: DC | PRN
  Administered 2019-10-27: 10 mL
  Filled 2019-10-27: qty 10

## 2019-10-27 MED ORDER — INFLUENZA VAC A&B SA ADJ QUAD 0.5 ML IM PRSY
0.5000 mL | PREFILLED_SYRINGE | Freq: Once | INTRAMUSCULAR | Status: AC
  Administered 2019-10-27: 0.5 mL via INTRAMUSCULAR

## 2019-10-27 MED ORDER — SODIUM CHLORIDE 0.9% FLUSH
10.0000 mL | INTRAVENOUS | Status: DC | PRN
  Administered 2019-10-27: 10 mL via INTRAVENOUS
  Filled 2019-10-27: qty 10

## 2019-10-27 MED ORDER — TRASTUZUMAB-ANNS CHEMO 420 MG IV SOLR
450.0000 mg | Freq: Once | INTRAVENOUS | Status: AC
  Administered 2019-10-27: 450 mg via INTRAVENOUS
  Filled 2019-10-27: qty 21.43

## 2019-10-27 MED ORDER — HEPARIN SOD (PORK) LOCK FLUSH 100 UNIT/ML IV SOLN
500.0000 [IU] | Freq: Once | INTRAVENOUS | Status: AC | PRN
  Administered 2019-10-27: 500 [IU]
  Filled 2019-10-27: qty 5

## 2019-10-27 NOTE — Patient Instructions (Addendum)
[facility] Discharge Instructions for Patients Receiving Chemotherapy  Today you received the following chemotherapy agents: Trastuzumab, Pertuzumab  To help prevent nausea and vomiting after your treatment, we encourage you to take your nausea medication as directed.    If you develop nausea and vomiting that is not controlled by your nausea medication, call the clinic.   BELOW ARE SYMPTOMS THAT SHOULD BE REPORTED IMMEDIATELY:  *FEVER GREATER THAN 100.5 F  *CHILLS WITH OR WITHOUT FEVER  NAUSEA AND VOMITING THAT IS NOT CONTROLLED WITH YOUR NAUSEA MEDICATION  *UNUSUAL SHORTNESS OF BREATH  *UNUSUAL BRUISING OR BLEEDING  TENDERNESS IN MOUTH AND THROAT WITH OR WITHOUT PRESENCE OF ULCERS  *URINARY PROBLEMS  *BOWEL PROBLEMS  UNUSUAL RASH Items with * indicate a potential emergency and should be followed up as soon as possible.  Feel free to call the clinic should you have any questions or concerns. The clinic phone number is [phone].  Please show the [facility] at check-in to the Emergency Department and triage nurse.  Influenza Virus Vaccine injection What is this medicine? INFLUENZA VIRUS VACCINE (in floo EN zuh VAHY ruhs vak SEEN) helps to reduce the risk of getting influenza also known as the flu. The vaccine only helps protect you against some strains of the flu. This medicine may be used for other purposes; ask your health care provider or pharmacist if you have questions. COMMON BRAND NAME(S): Afluria, Afluria Quadrivalent, Agriflu, Alfuria, FLUAD, Fluarix, Fluarix Quadrivalent, Flublok, Flublok Quadrivalent, FLUCELVAX, FLUCELVAX Quadrivalent, Flulaval, Flulaval Quadrivalent, Fluvirin, Fluzone, Fluzone High-Dose, Fluzone Intradermal, Fluzone Quadrivalent What should I tell my health care provider before I take this medicine? They need to know if you have any of these conditions:  bleeding disorder like hemophilia  fever or  infection  Guillain-Barre syndrome or other neurological problems  immune system problems  infection with the human immunodeficiency virus (HIV) or AIDS  low blood platelet counts  multiple sclerosis  an unusual or allergic reaction to influenza virus vaccine, latex, other medicines, foods, dyes, or preservatives. Different brands of vaccines contain different allergens. Some may contain latex or eggs. Talk to your doctor about your allergies to make sure that you get the right vaccine.  pregnant or trying to get pregnant  breast-feeding How should I use this medicine? This vaccine is for injection into a muscle or under the skin. It is given by a health care professional. A copy of Vaccine Information Statements will be given before each vaccination. Read this sheet carefully each time. The sheet may change frequently. Talk to your healthcare provider to see which vaccines are right for you. Some vaccines should not be used in all age groups. Overdosage: If you think you have taken too much of this medicine contact a poison control center or emergency room at once. NOTE: This medicine is only for you. Do not share this medicine with others. What if I miss a dose? This does not apply. What may interact with this medicine?  chemotherapy or radiation therapy  medicines that lower your immune system like etanercept, anakinra, infliximab, and adalimumab  medicines that treat or prevent blood clots like warfarin  phenytoin  steroid medicines like prednisone or cortisone  theophylline  vaccines This list may not describe all possible interactions. Give your health care provider a list of all the medicines, herbs, non-prescription drugs, or dietary supplements you use. Also tell them if you smoke, drink alcohol, or use illegal drugs. Some items may interact with your medicine.  What should I watch for while using this medicine? Report any side effects that do not go away within 3  days to your doctor or health care professional. Call your health care provider if any unusual symptoms occur within 6 weeks of receiving this vaccine. You may still catch the flu, but the illness is not usually as bad. You cannot get the flu from the vaccine. The vaccine will not protect against colds or other illnesses that may cause fever. The vaccine is needed every year. What side effects may I notice from receiving this medicine? Side effects that you should report to your doctor or health care professional as soon as possible:  allergic reactions like skin rash, itching or hives, swelling of the face, lips, or tongue Side effects that usually do not require medical attention (report to your doctor or health care professional if they continue or are bothersome):  fever  headache  muscle aches and pains  pain, tenderness, redness, or swelling at the injection site  tiredness This list may not describe all possible side effects. Call your doctor for medical advice about side effects. You may report side effects to FDA at 1-800-FDA-1088. Where should I keep my medicine? The vaccine will be given by a health care professional in a clinic, pharmacy, doctor's office, or other health care setting. You will not be given vaccine doses to store at home. NOTE: This sheet is a summary. It may not cover all possible information. If you have questions about this medicine, talk to your doctor, pharmacist, or health care provider.  2020 Elsevier/Gold Standard (2017-12-02 08:45:43)

## 2019-10-27 NOTE — Patient Instructions (Signed)

## 2019-10-27 NOTE — Telephone Encounter (Signed)
-----   Message from Larey Seat, MD sent at 10/26/2019  5:17 PM EDT -----  Summary & Diagnosis:     The patient presented now with mild OSA at AHI of 13.2/h, non  -REM dependent sleep apnea. There is no tachy-bradycardia noted,  no hypoxia and the AHI is mostly supine positional dependent  (AHI- apnea hypopnea Index- is over 20/h in supine ) .  Recommendations:    The patient needs to avoid the supine sleep position- that alone  can treat her apnea. If that positional change is not possible, I  would recommend a dental device to treat mild apnea and snoring  or CPAP as another option, but not the only choice in treatment  of uncomplicated sleep apnea. Diabetic macular edema can benefit from CPAP therapy- this part of the medical history was not copied to the sleep study info-  The patient had recently asked for a different mask- if that set-  up is well tolerated, she can continue to use CPAP and I will set  the pressures close to her current one.  Please let me know what Mrs Smock's preference is !   Cc Dres Hassell Done, and NP Ward Givens, and note to ophthalmologist/ retinal specialist

## 2019-10-27 NOTE — Telephone Encounter (Signed)
Called and reviewed the sleep study results in detail with the patient . Informed that apnea was still present. Advised that Dr Brett Fairy would recommend position changes, continuing CPAP or offered a referral for dental device.  Pt had read over this in mychart and is deciding on what to do moving forward. She is wanting to discuss with other care doc's and get their thoughts. Pt will reach out once she decides whether to continue CPAP or dental device option.  She was appreciative for the results and had no further questions.

## 2019-10-28 ENCOUNTER — Ambulatory Visit: Payer: Medicare PPO | Admitting: Podiatry

## 2019-10-28 ENCOUNTER — Ambulatory Visit: Payer: Medicare PPO | Admitting: Orthotics

## 2019-10-28 DIAGNOSIS — M21371 Foot drop, right foot: Secondary | ICD-10-CM | POA: Diagnosis not present

## 2019-10-28 DIAGNOSIS — M21372 Foot drop, left foot: Secondary | ICD-10-CM | POA: Diagnosis not present

## 2019-10-28 DIAGNOSIS — E1142 Type 2 diabetes mellitus with diabetic polyneuropathy: Secondary | ICD-10-CM | POA: Diagnosis not present

## 2019-10-28 DIAGNOSIS — S90212A Contusion of left great toe with damage to nail, initial encounter: Secondary | ICD-10-CM

## 2019-10-28 DIAGNOSIS — M2041 Other hammer toe(s) (acquired), right foot: Secondary | ICD-10-CM

## 2019-10-28 NOTE — Progress Notes (Signed)
reording shoe 1/2 size bigger so brace will fit better.

## 2019-10-29 ENCOUNTER — Encounter: Payer: Self-pay | Admitting: Podiatry

## 2019-10-29 NOTE — Progress Notes (Signed)
  Subjective:  Patient ID: Darlene [name], female    DOB: 1950/12/24,  MRN: [id number]  Chief Complaint  Patient presents with  . Toe Pain    Left hallux nail is turning purple in color     69 y.o. female presents with the above complaint. History confirmed with patient.  Here to have her shoes checked by Liliane Channel today she is doing running.  Also has a new issue that the left hallux nail has began to collect 1 relief and is turning purple.  Objective:  Physical Exam: warm, good capillary refill, no trophic changes or ulcerative lesions, normal DP and PT pulses and reduced sensation at plantar soles of feet and pulps of toes. Left Foot: bunion deformity noted and Hammertoes, preulcerative callus plantar hallux hallux nail with hematoma involving 60 to 70% of the toenail Right Foot: bunion deformity noted and Hammertoe deformity noted   Assessment:   1. Acquired bilateral foot drop   2. Controlled type 2 diabetes mellitus with diabetic polyneuropathy, without long-term current use of insulin ([place])   3. Contusion of left great toe with damage to nail, initial encounter      Plan:  Patient was evaluated and treated and all questions answered.   Patient educated on diabetes. Discussed proper diabetic foot care and discussed risks and complications of disease. Educated patient in depth on reasons to return to the office immediately should he/she discover anything concerning or new on the feet. All questions answered. Discussed proper shoes as well.    Today she met with Liliane Channel to adjust her shoes and make sure they fit appropriately.  For the new issue of the hallux hematoma, I recommended that we drain the hematoma as the nail plate was well adhered to heal on its own.  Following sterile prep with Betadine and a digital block being performed on the hallux with 1.5 cc of 0.5% Marcaine plain and 2% Xylocaine 1.5 cc, the nail plate was punctured with several holes in order to drain the  hematoma which was successful.  A small compression wrap with dry gauze and Coban was applied and she will begin performing Epsom salt soaks at home.  If the nail comes loose and needs to be removed she will return for follow-up.   No follow-ups on file.

## 2019-11-01 ENCOUNTER — Telehealth: Payer: Self-pay | Admitting: Oncology

## 2019-11-01 NOTE — Telephone Encounter (Signed)
Appointments scheduled Per 10/6 LOS   Called patient and left message for next appointments.

## 2019-11-03 ENCOUNTER — Encounter (HOSPITAL_COMMUNITY): Payer: Self-pay | Admitting: Cardiology

## 2019-11-03 ENCOUNTER — Other Ambulatory Visit: Payer: Self-pay

## 2019-11-03 ENCOUNTER — Ambulatory Visit (HOSPITAL_BASED_OUTPATIENT_CLINIC_OR_DEPARTMENT_OTHER)
Admission: RE | Admit: 2019-11-03 | Discharge: 2019-11-03 | Disposition: A | Discharge status: Home / Self Care | Payer: Medicare PPO | Source: Ambulatory Visit | Attending: Cardiology | Admitting: Cardiology

## 2019-11-03 ENCOUNTER — Ambulatory Visit (HOSPITAL_COMMUNITY)
Admission: RE | Admit: 2019-11-03 | Discharge: 2019-11-03 | Disposition: A | Discharge status: Home / Self Care | Payer: Medicare PPO | Source: Ambulatory Visit | Attending: Cardiology | Admitting: Cardiology

## 2019-11-03 VITALS — BP 130/60 | HR 55 | Ht 69.0 in | Wt 177.2 lb

## 2019-11-03 DIAGNOSIS — Z5181 Encounter for therapeutic drug level monitoring: Secondary | ICD-10-CM | POA: Insufficient documentation

## 2019-11-03 DIAGNOSIS — Z0189 Encounter for other specified special examinations: Secondary | ICD-10-CM | POA: Diagnosis not present

## 2019-11-03 DIAGNOSIS — E11319 Type 2 diabetes mellitus with unspecified diabetic retinopathy without macular edema: Secondary | ICD-10-CM | POA: Diagnosis not present

## 2019-11-03 DIAGNOSIS — Z7982 Long term (current) use of aspirin: Secondary | ICD-10-CM | POA: Diagnosis not present

## 2019-11-03 DIAGNOSIS — E1140 Type 2 diabetes mellitus with diabetic neuropathy, unspecified: Secondary | ICD-10-CM | POA: Insufficient documentation

## 2019-11-03 DIAGNOSIS — F419 Anxiety disorder, unspecified: Secondary | ICD-10-CM | POA: Insufficient documentation

## 2019-11-03 DIAGNOSIS — I10 Essential (primary) hypertension: Secondary | ICD-10-CM

## 2019-11-03 DIAGNOSIS — G4733 Obstructive sleep apnea (adult) (pediatric): Secondary | ICD-10-CM | POA: Insufficient documentation

## 2019-11-03 DIAGNOSIS — Z9989 Dependence on other enabling machines and devices: Secondary | ICD-10-CM | POA: Diagnosis not present

## 2019-11-03 DIAGNOSIS — E785 Hyperlipidemia, unspecified: Secondary | ICD-10-CM | POA: Insufficient documentation

## 2019-11-03 DIAGNOSIS — K760 Fatty (change of) liver, not elsewhere classified: Secondary | ICD-10-CM | POA: Insufficient documentation

## 2019-11-03 DIAGNOSIS — N189 Chronic kidney disease, unspecified: Secondary | ICD-10-CM | POA: Insufficient documentation

## 2019-11-03 DIAGNOSIS — Z923 Personal history of irradiation: Secondary | ICD-10-CM | POA: Insufficient documentation

## 2019-11-03 DIAGNOSIS — C50911 Malignant neoplasm of unspecified site of right female breast: Secondary | ICD-10-CM | POA: Insufficient documentation

## 2019-11-03 DIAGNOSIS — Z794 Long term (current) use of insulin: Secondary | ICD-10-CM | POA: Insufficient documentation

## 2019-11-03 DIAGNOSIS — Z9884 Bariatric surgery status: Secondary | ICD-10-CM | POA: Insufficient documentation

## 2019-11-03 DIAGNOSIS — E1122 Type 2 diabetes mellitus with diabetic chronic kidney disease: Secondary | ICD-10-CM | POA: Insufficient documentation

## 2019-11-03 DIAGNOSIS — I2510 Atherosclerotic heart disease of native coronary artery without angina pectoris: Secondary | ICD-10-CM | POA: Insufficient documentation

## 2019-11-03 DIAGNOSIS — R001 Bradycardia, unspecified: Secondary | ICD-10-CM | POA: Insufficient documentation

## 2019-11-03 DIAGNOSIS — Z79899 Other long term (current) drug therapy: Secondary | ICD-10-CM | POA: Diagnosis not present

## 2019-11-03 DIAGNOSIS — F329 Major depressive disorder, single episode, unspecified: Secondary | ICD-10-CM | POA: Insufficient documentation

## 2019-11-03 DIAGNOSIS — C50411 Malignant neoplasm of upper-outer quadrant of right female breast: Secondary | ICD-10-CM | POA: Diagnosis not present

## 2019-11-03 DIAGNOSIS — I129 Hypertensive chronic kidney disease with stage 1 through stage 4 chronic kidney disease, or unspecified chronic kidney disease: Secondary | ICD-10-CM | POA: Diagnosis not present

## 2019-11-03 DIAGNOSIS — Z8249 Family history of ischemic heart disease and other diseases of the circulatory system: Secondary | ICD-10-CM | POA: Diagnosis not present

## 2019-11-03 DIAGNOSIS — I440 Atrioventricular block, first degree: Secondary | ICD-10-CM | POA: Insufficient documentation

## 2019-11-03 LAB — ECHOCARDIOGRAM COMPLETE
Area-P 1/2: 3.42 cm2
S' Lateral: 2.9 cm

## 2019-11-03 NOTE — Progress Notes (Signed)
Patient ID: Darlene [name], female   DOB: 1950/02/09, 69 y.o.   MRN: [id number] Oncologist: Dr Jana Hakim   HPI: Darlene [name] is a 69 y.o.with history of R breast cancer, right axillary lymph node biopsy 07/19/2014 for a cT2 pN1, stage IIB invasive ductal carcinoma, grade 2, estrogen and progesterone receptor negative, with an MIB-1 of 90%, and HER-2 positive , OSA -> CPAP, bariatric surgery, and DM, referred to cardio-oncology clinic by Dr Jana Hakim  Completed neoadjuvant chemotherapy with carboplatin, docetaxel, trastuzumab and pertuzumab given every 3 weeks 6. Trastuzumab + capecitabine were completed in 9/17.  She had lumpectomy 12/16 and had mastectomy in 1/17.  She has completed radiation.   She was found in 11/17 to have a recurrence by PET => right retropectoral lymph node enlarged. In 2/18, she started on treatment with ado-trastuzumab emtansine, now back on Herceptin.  Echo prior to treatment showed stable EF.  Starting in 6/19, she was transitioned to [facility].   She is now off Kadcyla and back on Herceptin/Perjeta for use long-term.     Echo in 2/21 showed EF 55-60% with more negative strain.   Coronary artery calcium score was done in 2/21, this showed CAC 3653 Agatston units, placing her in the 99th percentile. She was started on a Crestor but LFTs rose.  She had RUQ [name] without significant abnormality.  Hepatitis labs were negative.  The statin was stopped.  Cardiolite in 3/21 showed EF 51%, no ischemia/infarction.  She ended up on bempedoic acid.   Echo in 6/21 showed EF 55-60% with GLS -17.6%.  Echo today showed EF 55-60%, GLS -15.8%, normal RV.   She comes in today for followup of CAD and chemotherapy with Herceptin.  Symptomatically doing well.  No exertional dyspnea or chest pain.  No orthopnea/PND.  No lightheadedness.   ECG (personally reviewed): sinus bradycardia with 1st degree AVB, poor RWP.   Labs (1/21): LDL 75 Labs (4/21): LDL 42, AST 70 => 125, ALT 117 => 255 Labs  (10/21): LDL 18, HDL 58,  K 3.8, creatinine 1.88  Echo (7/16) with EF 60-65%, grade II diastolic dysfunction.  Echo (10/16) with EF 66%, GLS -17%. Echo (1/17) with EF 60-65%, grade II diastolic dysfunction, lateral s' 10, GLS -18.8% Echo (4/17) with EF 60-65%, grade II diastolic dysfunction, lateral s' 9.5, strain poorly traced and off axis, normal RV size and systolic function.  Echo (6/17) with EF 55-60%, aortic sclerosis without stenosis, difficult images for strain, probably not accurate.  Echo (10/17) with EF 55-60%, mild LVH, moderate diastolic dysfunction, normal RV size and systolic function.  Echo (2/18) with EF 55-60%, GLS -15% but technically difficult study Echo (6/18) with EF 60-65%, GLS -15% but technically difficult study.  Echo (9/18) with EF 55-60%, GLS -19.8%, grade II diastolic dysfunction.  Echo (1/19) with EF 55-60%, normal RV size and systolic function, GLS -70.1% (done with different machine).  Echo (4/19) with EF 55-60%, mild LVH, GLS -17% Echo (9/19) with EF 55-60%, GLS -17.1%, normal RV size and systolic function.  Echo (8/20): EF 50-55%.  Echo (11/20): EF 50-55%, GLS -12.7% (not ideal tracing), mildly decreased RV systolic function.   Echo (2/21): EF 55-60%, GLS -17.9%, grade II diastolic dysfunction, normal RV Echo (6/21): EF 55-60% with GLS -17.6% Echo (10/21): EF 55-60%, normal RV, GLS -15.8%.   Review of Systems: All systems reviewed and negative except as per HPI.   Past Medical History:  Diagnosis Date  . Anemia    hx of - during 1st  round chemo  . Anxiety   . Arthritis    in fingers, shoulders  . Back pain   . Balance problem   . Breast cancer ([place])   . Breast cancer of upper-outer quadrant of right female breast ([place]) 07/22/2014  . Bunion, right foot   . Chronic kidney disease    creatininei levels high per pt   . Clumsiness   . Constipation   . Depression   . Diabetes mellitus without complication ([place])    01+ years type 2   . Diabetic  retinopathy ([place])    torn retina  . Eating disorder    binge eating  . Epistaxis 08/26/2014  . Fatty liver   . Foot drop, left foot   . HCAP (healthcare-associated pneumonia) 12/18/2014  . Heart murmur    never had any problems  . History of radiation therapy 04/05/15-05/19/15   right chest wall was treated to 50.4 Gy in 28 fractions, right mastectomy scar was treated to 10 Gy in 5 fractions  . Hyperlipidemia   . Hypertension   . Joint pain   . Multiple food allergies   . Neuromuscular disorder ([place])    diabetic neuropathy  . Obesity   . Obstructive sleep apnea on CPAP    does not use cpap all the time has lost 160 lbs   . Ovarian cyst rupture    possible  . Pneumonia   . Pneumonia 11/2014  . Radiation 04/05/15-05/19/15   right chest wall 50.4 Gy, mastectomy scar 10 Gy  . Sleep apnea   . Thyroid nodule 06/2014    Current Outpatient Medications  Medication Sig Dispense Refill  . Accu-Chek Softclix Lancets lancets 1 each by Other route 4 (four) times daily. Test blood sugars 4 x times daily 100 each 1  . aspirin EC 81 MG tablet Take 1 tablet (81 mg total) by mouth daily. 90 tablet 3  . Bempedoic Acid-Ezetimibe (NEXLIZET) 180-10 MG TABS Take 1 tablet by mouth daily. 90 tablet 3  . Blood Glucose Calibration (ACCU-CHEK GUIDE CONTROL) LIQD 1 Bottle by In Vitro route as needed. 1 each 0  . Blood Glucose Monitoring Suppl (ACCU-CHEK GUIDE) w/Device KIT 1 each by Does not apply route 4 (four) times daily. Use to test blood sugars 4x a day 1 kit 0  . buPROPion (WELLBUTRIN SR) 200 MG 12 hr tablet TAKE 1 TABLET AT 12 NOON. 90 tablet 0  . CALCIUM CITRATE-VITAMIN D PO Take 1 tablet by mouth 3 (three) times daily.     . carvedilol (COREG) 3.125 MG tablet TAKE 1 TABLET BY MOUTH TWICE DAILY. 180 tablet 3  . cholecalciferol (VITAMIN D) 1000 units tablet Take 1,000 Units by mouth daily.    . cholestyramine (QUESTRAN) 4 g packet Take 1 packet (4 g total) by mouth 2 (two) times daily as needed. 10 each  12  . Cyanocobalamin (VITAMIN B-12 PO) Take 1 tablet by mouth every 14 (fourteen) days.    . diphenhydrAMINE (BENADRYL) 25 MG tablet Take 25 mg by mouth daily as needed for allergies or sleep.     [name] glucose blood (ACCU-CHEK GUIDE) test strip 1 each by Other route 4 (four) times daily. Accu Chek Guide  Test blood sugars 4 times a day 100 each 1  . liraglutide (VICTOZA) 18 MG/3ML SOPN Pt to take 1.8 mg qam 9 mL 2  . loratadine (CLARITIN) 10 MG tablet Take 10 mg by mouth as needed for allergies.    [name] losartan (COZAAR) 100  MG tablet Take 1 tablet (100 mg total) by mouth daily. 90 tablet 0  . ondansetron (ZOFRAN) 4 MG tablet Take 1 tablet (4 mg total) by mouth every 8 (eight) hours as needed for nausea or vomiting. 20 tablet 0  . Pediatric Multivitamins-Iron (FLINTSTONES PLUS IRON) chewable tablet Chew 1 tablet by mouth 2 (two) times daily.    . Probiotic Product (CVS PROBIOTIC PO) Take 1 capsule by mouth daily.     . prochlorperazine (COMPAZINE) 10 MG tablet Take 1 tablet (10 mg total) by mouth every 6 (six) hours as needed for nausea or vomiting. 30 tablet 0  . Propylene Glycol (SYSTANE BALANCE OP) Place 1-2 drops into both eyes 3 (three) times daily as needed (for dry eyes).     [name] senna-docusate (SENNA PLUS) 8.6-50 MG tablet Take 1 tablet by mouth daily as needed for moderate constipation.     . SURE COMFORT PEN NEEDLES 32G X 4 MM MISC USE WITH INJECTIONS 100 each 0  . topiramate (TOPAMAX) 25 MG tablet Take 1 tablet (25 mg total) by mouth daily. 90 tablet 0  . TRINTELLIX 20 MG TABS tablet TAKE ONE TABLET AT BEDTIME. 90 tablet 0  . insulin glargine (LANTUS SOLOSTAR) 100 UNIT/ML Solostar Pen Inject 10 Units into the skin daily. 9 mL 0   No current facility-administered medications for this encounter.    Allergies  Allergen Reactions  . Nsaids Other (See Comments)    H/o gastric bypass - avoid NSAIDs  . Tape Hives, Rash and Other (See Comments)    Adhesives in bandaids      Social History    Socioeconomic History  . Marital status: Married    Spouse name: [name]  . Number of children: 0  . Years of education: Bachelor's  . Highest education level: Not on file  Occupational History  . Occupation: Pensions consultant: [employer]  Tobacco Use  . Smoking status: Never Smoker  . Smokeless tobacco: Never Used  Vaping Use  . Vaping Use: Never used  Substance and Sexual Activity  . Alcohol use: Yes    Alcohol/week: 1.0 - 2.0 standard drink    Types: 1 - 2 Standard drinks or equivalent per week    Comment: social  . Drug use: No  . Sexual activity: Yes    Partners: Male    Birth control/protection: Post-menopausal, Other-see comments    Comment: husband vasectomy  Other Topics Concern  . Not on file  Social History Narrative   Patient is married [name]) and lives at home with her husband.   Patient does not have any children.   Patient is working for a [employer].   Patient has a Bachelor's degree.   Patient is right-handed.   Patient does not drink any caffeine.   Social Determinants of Health   Financial Resource Strain:   . Difficulty of Paying Living Expenses: Not on file  Food Insecurity:   . Worried About Charity fundraiser in the Last Year: Not on file  . Ran Out of Food in the Last Year: Not on file  Transportation Needs:   . Lack of Transportation (Medical): Not on file  . Lack of Transportation (Non-Medical): Not on file  Physical Activity:   . Days of Exercise per Week: Not on file  . Minutes of Exercise per Session: Not on file  Stress:   . Feeling of Stress : Not on file  Social Connections:   . Frequency  of Communication with Friends and Family: Not on file  . Frequency of Social Gatherings with Friends and Family: Not on file  . Attends Religious Services: Not on file  . Active Member of Clubs or Organizations: Not on file  . Attends Archivist Meetings: Not on file  . Marital Status: Not on file   Intimate Partner Violence:   . Fear of Current or Ex-Partner: Not on file  . Emotionally Abused: Not on file  . Physically Abused: Not on file  . Sexually Abused: Not on file      Family History  Problem Relation Age of Onset  . CVA Mother   . Heart disease Mother   . Sudden death Mother   . Diabetes Father   . Heart failure Father   . Hypertension Father   . Kidney disease Father   . Obesity Father   . Hypertension Sister   . Diabetes Brother   . Breast cancer Paternal Grandmother 43  . Diabetes Paternal Uncle   . Ovarian cancer Neg Hx     Vitals:   11/03/19 0925  BP: 130/60  Pulse: (!) 55  SpO2: 98%  Weight: 80.4 kg (177 lb 3.2 oz)  Height: '5\' 9"'  (1.753 m)    PHYSICAL EXAM: General: NAD Neck: No JVD, no thyromegaly or thyroid nodule.  Lungs: Clear to auscultation bilaterally with normal respiratory effort. CV: Nondisplaced PMI.  Heart regular S1/S2, no S3/S4, no murmur.  No peripheral edema.  No carotid bruit.  Normal pedal pulses.  Abdomen: Soft, nontender, no hepatosplenomegaly, no distention.  Skin: Intact without lesions or rashes.  Neurologic: Alert and oriented x 3.  Psych: Normal affect. Extremities: No clubbing or cyanosis.  HEENT: Normal.   ASSESSMENT & PLAN:  1. Breast cancer: Metastatic.  Currently getting Herceptin and Perjeta long-term.  Echo in 11/20 showed EF 50-55%.  Echoes in 6/21 and 10/21 have showed EF stable at 55-60% with stable strain.  - Continue losartan 100 mg daily.  - Continue Coreg 3.125 mg bid.   - Repeat echo + followup in 4 months.   If EF remains stable, increase interval to every 6 months.  2. HTN: BP controlled on losartan + Coreg.  3. OSA: Continue nightly CPAP   4. CAD: Coronary artery calcium score was elevated, 3653 Agatston units, placing her in the 99th percentile for her age and gender.  This suggests high risk for future cardiac events.  She has no chest pain.  Cardiolite 3/21 with no ischemia/infarction.  Rise in  LFTs with Crestor. Unable to obtain Repatha for her.  - She is now on bempedoic acid with excellent LDL.  - Continue ASA 81, ARB, and beta blocker.   Followup in 4 months with echo  Loralie Champagne 11/03/2019

## 2019-11-03 NOTE — Patient Instructions (Signed)
Your physician recommends that you schedule a follow-up appointment in: 4 months with an echocardiogram  If you have any questions or concerns before your next appointment please send [name] a message through [place] or call our office at [phone].    TO LEAVE A MESSAGE FOR THE NURSE SELECT OPTION 2, PLEASE LEAVE A MESSAGE INCLUDING: . YOUR NAME . DATE OF BIRTH . CALL BACK NUMBER . REASON FOR CALL**this is important as we prioritize the call backs  YOU WILL RECEIVE A CALL BACK THE SAME DAY AS LONG AS YOU CALL BEFORE 4:00 PM

## 2019-11-03 NOTE — Progress Notes (Signed)
  Echocardiogram 2D Echocardiogram has been performed.  Michiel Cowboy 11/03/2019, 8:56 AM

## 2019-11-08 ENCOUNTER — Other Ambulatory Visit: Payer: Self-pay | Admitting: Neurology

## 2019-11-08 DIAGNOSIS — G4733 Obstructive sleep apnea (adult) (pediatric): Secondary | ICD-10-CM

## 2019-11-08 DIAGNOSIS — Z9989 Dependence on other enabling machines and devices: Secondary | ICD-10-CM

## 2019-11-14 ENCOUNTER — Other Ambulatory Visit (INDEPENDENT_AMBULATORY_CARE_PROVIDER_SITE_OTHER): Payer: Self-pay | Admitting: Family Medicine

## 2019-11-23 DIAGNOSIS — H903 Sensorineural hearing loss, bilateral: Secondary | ICD-10-CM | POA: Diagnosis not present

## 2019-11-24 ENCOUNTER — Ambulatory Visit: Payer: Medicare PPO | Admitting: Podiatry

## 2019-11-24 ENCOUNTER — Inpatient Hospital Stay: Payer: Medicare PPO

## 2019-11-24 ENCOUNTER — Other Ambulatory Visit: Payer: Self-pay

## 2019-11-24 ENCOUNTER — Encounter: Payer: Self-pay | Admitting: Podiatry

## 2019-11-24 ENCOUNTER — Inpatient Hospital Stay: Payer: Medicare PPO | Attending: Oncology

## 2019-11-24 VITALS — BP 121/70 | HR 68 | Temp 98.4°F

## 2019-11-24 DIAGNOSIS — C787 Secondary malignant neoplasm of liver and intrahepatic bile duct: Secondary | ICD-10-CM

## 2019-11-24 DIAGNOSIS — C50411 Malignant neoplasm of upper-outer quadrant of right female breast: Secondary | ICD-10-CM | POA: Diagnosis not present

## 2019-11-24 DIAGNOSIS — Z5112 Encounter for antineoplastic immunotherapy: Secondary | ICD-10-CM | POA: Diagnosis not present

## 2019-11-24 DIAGNOSIS — Z171 Estrogen receptor negative status [ER-]: Secondary | ICD-10-CM

## 2019-11-24 DIAGNOSIS — E1140 Type 2 diabetes mellitus with diabetic neuropathy, unspecified: Secondary | ICD-10-CM

## 2019-11-24 DIAGNOSIS — M21372 Foot drop, left foot: Secondary | ICD-10-CM

## 2019-11-24 DIAGNOSIS — E1149 Type 2 diabetes mellitus with other diabetic neurological complication: Secondary | ICD-10-CM | POA: Diagnosis not present

## 2019-11-24 DIAGNOSIS — Z95828 Presence of other vascular implants and grafts: Secondary | ICD-10-CM

## 2019-11-24 DIAGNOSIS — C50911 Malignant neoplasm of unspecified site of right female breast: Secondary | ICD-10-CM

## 2019-11-24 DIAGNOSIS — L84 Corns and callosities: Secondary | ICD-10-CM

## 2019-11-24 DIAGNOSIS — C801 Malignant (primary) neoplasm, unspecified: Secondary | ICD-10-CM

## 2019-11-24 LAB — COMPREHENSIVE METABOLIC PANEL
ALT: 14 U/L (ref 0–44)
AST: 25 U/L (ref 15–41)
Albumin: 3.8 g/dL (ref 3.5–5.0)
Alkaline Phosphatase: 59 U/L (ref 38–126)
Anion gap: 7 (ref 5–15)
BUN: 41 mg/dL — ABNORMAL HIGH (ref 8–23)
CO2: 27 mmol/L (ref 22–32)
Calcium: 9.1 mg/dL (ref 8.9–10.3)
Chloride: 109 mmol/L (ref 98–111)
Creatinine, Ser: 1.77 mg/dL — ABNORMAL HIGH (ref 0.44–1.00)
GFR, Estimated: 31 mL/min — ABNORMAL LOW (ref >60)
Glucose, Bld: 78 mg/dL (ref 70–99)
Potassium: 4.3 mmol/L (ref 3.5–5.1)
Sodium: 143 mmol/L (ref 135–145)
Total Bilirubin: 0.5 mg/dL (ref 0.3–1.2)
Total Protein: 6.3 g/dL — ABNORMAL LOW (ref 6.5–8.1)

## 2019-11-24 LAB — CBC WITH DIFFERENTIAL/PLATELET
Abs Immature Granulocytes: 0.01 10*3/uL (ref 0.00–0.07)
Basophils Absolute: 0 10*3/uL (ref 0.0–0.1)
Basophils Relative: 0 %
Eosinophils Absolute: 0.2 10*3/uL (ref 0.0–0.5)
Eosinophils Relative: 4 %
HCT: 28.9 % — ABNORMAL LOW (ref 36.0–46.0)
Hemoglobin: 9.8 g/dL — ABNORMAL LOW (ref 12.0–15.0)
Immature Granulocytes: 0 %
Lymphocytes Relative: 27 %
Lymphs Abs: 1.2 10*3/uL (ref 0.7–4.0)
MCH: 32.6 pg (ref 26.0–34.0)
MCHC: 33.9 g/dL (ref 30.0–36.0)
MCV: 96 fL (ref 80.0–100.0)
Monocytes Absolute: 0.5 10*3/uL (ref 0.1–1.0)
Monocytes Relative: 10 %
Neutro Abs: 2.7 10*3/uL (ref 1.7–7.7)
Neutrophils Relative %: 59 %
Platelets: 158 10*3/uL (ref 150–400)
RBC: 3.01 MIL/uL — ABNORMAL LOW (ref 3.87–5.11)
RDW: 12.5 % (ref 11.5–15.5)
WBC: 4.6 10*3/uL (ref 4.0–10.5)
nRBC: 0 % (ref 0.0–0.2)

## 2019-11-24 MED ORDER — TRASTUZUMAB-ANNS CHEMO 420 MG IV SOLR
450.0000 mg | Freq: Once | INTRAVENOUS | Status: AC
  Administered 2019-11-24: 450 mg via INTRAVENOUS
  Filled 2019-11-24: qty 21.43

## 2019-11-24 MED ORDER — SODIUM CHLORIDE 0.9% FLUSH
10.0000 mL | INTRAVENOUS | Status: DC | PRN
  Filled 2019-11-24: qty 10

## 2019-11-24 MED ORDER — SODIUM CHLORIDE 0.9 % IV SOLN
420.0000 mg | Freq: Once | INTRAVENOUS | Status: AC
  Administered 2019-11-24: 420 mg via INTRAVENOUS
  Filled 2019-11-24: qty 14

## 2019-11-24 MED ORDER — ACETAMINOPHEN 325 MG PO TABS
650.0000 mg | ORAL_TABLET | Freq: Once | ORAL | Status: AC
  Administered 2019-11-24: 650 mg via ORAL

## 2019-11-24 MED ORDER — DIPHENHYDRAMINE HCL 25 MG PO CAPS
ORAL_CAPSULE | ORAL | Status: AC
  Filled 2019-11-24: qty 1

## 2019-11-24 MED ORDER — SODIUM CHLORIDE 0.9% FLUSH
10.0000 mL | INTRAVENOUS | Status: DC | PRN
  Administered 2019-11-24: 10 mL via INTRAVENOUS
  Filled 2019-11-24: qty 10

## 2019-11-24 MED ORDER — SODIUM CHLORIDE 0.9 % IV SOLN
Freq: Once | INTRAVENOUS | Status: AC
  Filled 2019-11-24: qty 250

## 2019-11-24 MED ORDER — DIPHENHYDRAMINE HCL 25 MG PO CAPS
25.0000 mg | ORAL_CAPSULE | Freq: Once | ORAL | Status: AC
  Administered 2019-11-24: 25 mg via ORAL

## 2019-11-24 MED ORDER — HEPARIN SOD (PORK) LOCK FLUSH 100 UNIT/ML IV SOLN
500.0000 [IU] | Freq: Once | INTRAVENOUS | Status: DC | PRN
  Filled 2019-11-24: qty 5

## 2019-11-24 MED ORDER — ACETAMINOPHEN 325 MG PO TABS
ORAL_TABLET | ORAL | Status: AC
  Filled 2019-11-24: qty 2

## 2019-11-24 NOTE — Patient Instructions (Signed)
[facility] Discharge Instructions for Patients Receiving Chemotherapy  Today you received the following chemotherapy agents: trastuzumab, pertuzumab To help prevent nausea and vomiting after your treatment, we encourage you to take your nausea medication as directed.   If you develop nausea and vomiting that is not controlled by your nausea medication, call the clinic.   BELOW ARE SYMPTOMS THAT SHOULD BE REPORTED IMMEDIATELY:  *FEVER GREATER THAN 100.5 F  *CHILLS WITH OR WITHOUT FEVER  NAUSEA AND VOMITING THAT IS NOT CONTROLLED WITH YOUR NAUSEA MEDICATION  *UNUSUAL SHORTNESS OF BREATH  *UNUSUAL BRUISING OR BLEEDING  TENDERNESS IN MOUTH AND THROAT WITH OR WITHOUT PRESENCE OF ULCERS  *URINARY PROBLEMS  *BOWEL PROBLEMS  UNUSUAL RASH Items with * indicate a potential emergency and should be followed up as soon as possible.  Feel free to call the clinic should you have any questions or concerns. The clinic phone number is [phone].  Please show the CHEMO ALERT CARD at check-in to the Emergency Department and triage nurse.   

## 2019-11-24 NOTE — Progress Notes (Signed)
Subjective:   Patient ID: Darlene [name], female   DOB: 68 y.o.   MRN: [id number]   HPI Patient presents wanting to have her left big toenail looked at and also states the brace is effective but her toes get cramped and she has lesions on both metatarsals first that she cannot deal with herself   ROS      Objective:  Physical Exam  Neurovascular status found to be unchanged with patient having foot drop left and also having some trauma to the left big toenail and has keratotic lesion on the hallux bilateral     Assessment:  Chronic lesion formation hallux bilateral along with discoloration of the hallux nail left and foot drop left     Plan:  H&P reviewed all conditions debrided lesions on the hallux bilateral and discussed cushioning the areas and do not recommend any further treatment for the nail condition.  Patient will see ped orthotist as needed for adjustments and will be seen back by [name] as needed for trimmings or any other pathology which may occur.  I reviewed foot drop with her and its causes and the brace she wears and again we will have her see ped orthotist for adjustments

## 2019-11-24 NOTE — Patient Instructions (Signed)

## 2019-12-10 ENCOUNTER — Encounter: Payer: Self-pay | Admitting: Podiatry

## 2019-12-17 ENCOUNTER — Other Ambulatory Visit (INDEPENDENT_AMBULATORY_CARE_PROVIDER_SITE_OTHER): Payer: Self-pay | Admitting: Family Medicine

## 2019-12-17 DIAGNOSIS — Z794 Long term (current) use of insulin: Secondary | ICD-10-CM

## 2019-12-17 DIAGNOSIS — E119 Type 2 diabetes mellitus without complications: Secondary | ICD-10-CM

## 2019-12-20 ENCOUNTER — Encounter (INDEPENDENT_AMBULATORY_CARE_PROVIDER_SITE_OTHER): Payer: Self-pay

## 2019-12-20 NOTE — Telephone Encounter (Signed)
MyChart message sent to pt to find out if they have enough medication to get them through until next appt.   

## 2019-12-20 NOTE — Telephone Encounter (Signed)
Refill form given to MD to approve/deny.

## 2019-12-22 ENCOUNTER — Inpatient Hospital Stay: Payer: Medicare PPO

## 2019-12-22 ENCOUNTER — Other Ambulatory Visit: Payer: Self-pay

## 2019-12-22 ENCOUNTER — Inpatient Hospital Stay: Payer: Medicare PPO | Attending: Oncology

## 2019-12-22 VITALS — BP 127/54 | HR 69 | Temp 98.6°F | Resp 17 | Wt 176.5 lb

## 2019-12-22 DIAGNOSIS — C801 Malignant (primary) neoplasm, unspecified: Secondary | ICD-10-CM

## 2019-12-22 DIAGNOSIS — Z95828 Presence of other vascular implants and grafts: Secondary | ICD-10-CM

## 2019-12-22 DIAGNOSIS — C50911 Malignant neoplasm of unspecified site of right female breast: Secondary | ICD-10-CM

## 2019-12-22 DIAGNOSIS — C787 Secondary malignant neoplasm of liver and intrahepatic bile duct: Secondary | ICD-10-CM

## 2019-12-22 DIAGNOSIS — C50411 Malignant neoplasm of upper-outer quadrant of right female breast: Secondary | ICD-10-CM

## 2019-12-22 DIAGNOSIS — Z5112 Encounter for antineoplastic immunotherapy: Secondary | ICD-10-CM | POA: Insufficient documentation

## 2019-12-22 DIAGNOSIS — Z171 Estrogen receptor negative status [ER-]: Secondary | ICD-10-CM

## 2019-12-22 LAB — COMPREHENSIVE METABOLIC PANEL
ALT: 14 U/L (ref 0–44)
AST: 23 U/L (ref 15–41)
Albumin: 3.8 g/dL (ref 3.5–5.0)
Alkaline Phosphatase: 54 U/L (ref 38–126)
Anion gap: 6 (ref 5–15)
BUN: 36 mg/dL — ABNORMAL HIGH (ref 8–23)
CO2: 23 mmol/L (ref 22–32)
Calcium: 9.2 mg/dL (ref 8.9–10.3)
Chloride: 111 mmol/L (ref 98–111)
Creatinine, Ser: 1.82 mg/dL — ABNORMAL HIGH (ref 0.44–1.00)
GFR, Estimated: 30 mL/min — ABNORMAL LOW (ref >60)
Glucose, Bld: 72 mg/dL (ref 70–99)
Potassium: 4.3 mmol/L (ref 3.5–5.1)
Sodium: 140 mmol/L (ref 135–145)
Total Bilirubin: 0.5 mg/dL (ref 0.3–1.2)
Total Protein: 6.4 g/dL — ABNORMAL LOW (ref 6.5–8.1)

## 2019-12-22 LAB — CBC WITH DIFFERENTIAL/PLATELET
Abs Immature Granulocytes: 0.04 10*3/uL (ref 0.00–0.07)
Basophils Absolute: 0 10*3/uL (ref 0.0–0.1)
Basophils Relative: 1 %
Eosinophils Absolute: 0.3 10*3/uL (ref 0.0–0.5)
Eosinophils Relative: 6 %
HCT: 28.8 % — ABNORMAL LOW (ref 36.0–46.0)
Hemoglobin: 9.9 g/dL — ABNORMAL LOW (ref 12.0–15.0)
Immature Granulocytes: 1 %
Lymphocytes Relative: 26 %
Lymphs Abs: 1.3 10*3/uL (ref 0.7–4.0)
MCH: 32.4 pg (ref 26.0–34.0)
MCHC: 34.4 g/dL (ref 30.0–36.0)
MCV: 94.1 fL (ref 80.0–100.0)
Monocytes Absolute: 0.4 10*3/uL (ref 0.1–1.0)
Monocytes Relative: 8 %
Neutro Abs: 3 10*3/uL (ref 1.7–7.7)
Neutrophils Relative %: 58 %
Platelets: 189 10*3/uL (ref 150–400)
RBC: 3.06 MIL/uL — ABNORMAL LOW (ref 3.87–5.11)
RDW: 12.8 % (ref 11.5–15.5)
WBC: 5 10*3/uL (ref 4.0–10.5)
nRBC: 0 % (ref 0.0–0.2)

## 2019-12-22 LAB — CEA (IN HOUSE-CHCC): CEA (CHCC-In House): 1.22 ng/mL (ref 0.00–5.00)

## 2019-12-22 MED ORDER — SODIUM CHLORIDE 0.9 % IV SOLN
420.0000 mg | Freq: Once | INTRAVENOUS | Status: AC
  Administered 2019-12-22: 420 mg via INTRAVENOUS
  Filled 2019-12-22: qty 14

## 2019-12-22 MED ORDER — DIPHENHYDRAMINE HCL 25 MG PO CAPS
25.0000 mg | ORAL_CAPSULE | Freq: Once | ORAL | Status: AC
  Administered 2019-12-22: 25 mg via ORAL

## 2019-12-22 MED ORDER — DIPHENHYDRAMINE HCL 25 MG PO CAPS
ORAL_CAPSULE | ORAL | Status: AC
  Filled 2019-12-22: qty 1

## 2019-12-22 MED ORDER — SODIUM CHLORIDE 0.9% FLUSH
10.0000 mL | INTRAVENOUS | Status: DC | PRN
  Administered 2019-12-22: 10 mL
  Filled 2019-12-22: qty 10

## 2019-12-22 MED ORDER — ACETAMINOPHEN 325 MG PO TABS
ORAL_TABLET | ORAL | Status: AC
  Filled 2019-12-22: qty 2

## 2019-12-22 MED ORDER — TRASTUZUMAB-ANNS CHEMO 420 MG IV SOLR
450.0000 mg | Freq: Once | INTRAVENOUS | Status: AC
  Administered 2019-12-22: 450 mg via INTRAVENOUS
  Filled 2019-12-22: qty 21.43

## 2019-12-22 MED ORDER — HEPARIN SOD (PORK) LOCK FLUSH 100 UNIT/ML IV SOLN
500.0000 [IU] | Freq: Once | INTRAVENOUS | Status: AC | PRN
  Administered 2019-12-22: 500 [IU]
  Filled 2019-12-22: qty 5

## 2019-12-22 MED ORDER — ACETAMINOPHEN 325 MG PO TABS
650.0000 mg | ORAL_TABLET | Freq: Once | ORAL | Status: AC
  Administered 2019-12-22: 650 mg via ORAL

## 2019-12-22 MED ORDER — SODIUM CHLORIDE 0.9 % IV SOLN
Freq: Once | INTRAVENOUS | Status: AC
  Filled 2019-12-22: qty 250

## 2019-12-22 NOTE — Patient Instructions (Signed)
[facility] Discharge Instructions for Patients Receiving Chemotherapy  Today you received the following chemotherapy agents: trastuzumab-anns (KANJINTI)/pertuzumab (PERJETA).  To help prevent nausea and vomiting after your treatment, we encourage you to take your nausea medication as directed.   If you develop nausea and vomiting that is not controlled by your nausea medication, call the clinic.   BELOW ARE SYMPTOMS THAT SHOULD BE REPORTED IMMEDIATELY:  *FEVER GREATER THAN 100.5 F  *CHILLS WITH OR WITHOUT FEVER  NAUSEA AND VOMITING THAT IS NOT CONTROLLED WITH YOUR NAUSEA MEDICATION  *UNUSUAL SHORTNESS OF BREATH  *UNUSUAL BRUISING OR BLEEDING  TENDERNESS IN MOUTH AND THROAT WITH OR WITHOUT PRESENCE OF ULCERS  *URINARY PROBLEMS  *BOWEL PROBLEMS  UNUSUAL RASH Items with * indicate a potential emergency and should be followed up as soon as possible.  Feel free to call the clinic should you have any questions or concerns. The clinic phone number is [phone].  Please show the [place] at check-in to the Emergency Department and triage nurse.

## 2019-12-29 DIAGNOSIS — C50911 Malignant neoplasm of unspecified site of right female breast: Secondary | ICD-10-CM | POA: Diagnosis not present

## 2020-01-03 ENCOUNTER — Other Ambulatory Visit: Payer: Self-pay

## 2020-01-03 ENCOUNTER — Ambulatory Visit: Payer: Medicare PPO | Admitting: Orthotics

## 2020-01-03 DIAGNOSIS — L84 Corns and callosities: Secondary | ICD-10-CM

## 2020-01-03 DIAGNOSIS — E1140 Type 2 diabetes mellitus with diabetic neuropathy, unspecified: Secondary | ICD-10-CM

## 2020-01-03 DIAGNOSIS — M2041 Other hammer toe(s) (acquired), right foot: Secondary | ICD-10-CM

## 2020-01-03 DIAGNOSIS — E1149 Type 2 diabetes mellitus with other diabetic neurological complication: Secondary | ICD-10-CM

## 2020-01-03 DIAGNOSIS — M21372 Foot drop, left foot: Secondary | ICD-10-CM

## 2020-01-03 NOTE — Progress Notes (Signed)
Picked up shoes; fit well with brace.

## 2020-01-04 ENCOUNTER — Encounter (INDEPENDENT_AMBULATORY_CARE_PROVIDER_SITE_OTHER): Payer: Self-pay | Admitting: Family Medicine

## 2020-01-04 ENCOUNTER — Ambulatory Visit (INDEPENDENT_AMBULATORY_CARE_PROVIDER_SITE_OTHER): Payer: Medicare PPO | Admitting: Family Medicine

## 2020-01-04 VITALS — BP 115/68 | HR 65 | Temp 97.6°F | Ht 69.0 in | Wt 169.0 lb

## 2020-01-04 DIAGNOSIS — F3289 Other specified depressive episodes: Secondary | ICD-10-CM | POA: Diagnosis not present

## 2020-01-04 DIAGNOSIS — E559 Vitamin D deficiency, unspecified: Secondary | ICD-10-CM | POA: Diagnosis not present

## 2020-01-04 DIAGNOSIS — E785 Hyperlipidemia, unspecified: Secondary | ICD-10-CM

## 2020-01-04 DIAGNOSIS — I152 Hypertension secondary to endocrine disorders: Secondary | ICD-10-CM

## 2020-01-04 DIAGNOSIS — Z794 Long term (current) use of insulin: Secondary | ICD-10-CM | POA: Diagnosis not present

## 2020-01-04 DIAGNOSIS — E669 Obesity, unspecified: Secondary | ICD-10-CM

## 2020-01-04 DIAGNOSIS — E1121 Type 2 diabetes mellitus with diabetic nephropathy: Secondary | ICD-10-CM

## 2020-01-04 DIAGNOSIS — E1169 Type 2 diabetes mellitus with other specified complication: Secondary | ICD-10-CM | POA: Diagnosis not present

## 2020-01-04 DIAGNOSIS — Z6830 Body mass index (BMI) 30.0-30.9, adult: Secondary | ICD-10-CM

## 2020-01-04 DIAGNOSIS — E1159 Type 2 diabetes mellitus with other circulatory complications: Secondary | ICD-10-CM | POA: Diagnosis not present

## 2020-01-04 MED ORDER — LOSARTAN POTASSIUM 100 MG PO TABS: 100.0000 mg | ORAL_TABLET | Freq: Every day | ORAL | 0 refills | Status: DC

## 2020-01-04 MED ORDER — VORTIOXETINE HBR 20 MG PO TABS: 20.0000 mg | ORAL_TABLET | Freq: Every day | ORAL | 0 refills | Status: DC

## 2020-01-04 MED ORDER — BUPROPION HCL ER (SR) 200 MG PO TB12: ORAL_TABLET | ORAL | 0 refills | Status: DC

## 2020-01-04 MED ORDER — TOPIRAMATE 25 MG PO TABS: 25.0000 mg | ORAL_TABLET | Freq: Every day | ORAL | 0 refills | Status: DC

## 2020-01-04 MED ORDER — SURE COMFORT PEN NEEDLES 32G X 4 MM MISC
0 refills | Status: DC
Start: 2020-01-04 — End: 2020-04-03

## 2020-01-04 MED ORDER — LANTUS SOLOSTAR 100 UNIT/ML ~~LOC~~ SOPN: 10.0000 [IU] | PEN_INJECTOR | Freq: Every day | SUBCUTANEOUS | 0 refills | Status: DC

## 2020-01-04 MED ORDER — ACCU-CHEK GUIDE VI STRP: ORAL_STRIP | 0 refills | Status: DC

## 2020-01-04 MED ORDER — VICTOZA 18 MG/3ML ~~LOC~~ SOPN
PEN_INJECTOR | SUBCUTANEOUS | 0 refills | Status: DC
Start: 2020-01-04 — End: 2020-04-03

## 2020-01-05 ENCOUNTER — Ambulatory Visit (INDEPENDENT_AMBULATORY_CARE_PROVIDER_SITE_OTHER): Payer: Medicare PPO | Admitting: Podiatry

## 2020-01-05 ENCOUNTER — Encounter: Payer: Self-pay | Admitting: Podiatry

## 2020-01-05 ENCOUNTER — Other Ambulatory Visit: Payer: Self-pay

## 2020-01-05 DIAGNOSIS — H35371 Puckering of macula, right eye: Secondary | ICD-10-CM | POA: Diagnosis not present

## 2020-01-05 DIAGNOSIS — M21372 Foot drop, left foot: Secondary | ICD-10-CM

## 2020-01-05 DIAGNOSIS — E1140 Type 2 diabetes mellitus with diabetic neuropathy, unspecified: Secondary | ICD-10-CM

## 2020-01-05 DIAGNOSIS — H02052 Trichiasis without entropian right lower eyelid: Secondary | ICD-10-CM | POA: Diagnosis not present

## 2020-01-05 DIAGNOSIS — E1149 Type 2 diabetes mellitus with other diabetic neurological complication: Secondary | ICD-10-CM

## 2020-01-05 DIAGNOSIS — E113513 Type 2 diabetes mellitus with proliferative diabetic retinopathy with macular edema, bilateral: Secondary | ICD-10-CM | POA: Diagnosis not present

## 2020-01-05 DIAGNOSIS — S90212A Contusion of left great toe with damage to nail, initial encounter: Secondary | ICD-10-CM

## 2020-01-05 DIAGNOSIS — H02055 Trichiasis without entropian left lower eyelid: Secondary | ICD-10-CM | POA: Diagnosis not present

## 2020-01-05 LAB — COMPREHENSIVE METABOLIC PANEL
ALT: 16 IU/L (ref 0–32)
AST: 24 IU/L (ref 0–40)
Albumin/Globulin Ratio: 2.2 (ref 1.2–2.2)
Albumin: 4.3 g/dL (ref 3.8–4.8)
Alkaline Phosphatase: 59 IU/L (ref 44–121)
BUN/Creatinine Ratio: 20 (ref 12–28)
BUN: 33 mg/dL — ABNORMAL HIGH (ref 8–27)
Bilirubin Total: 0.4 mg/dL (ref 0.0–1.2)
CO2: 24 mmol/L (ref 20–29)
Calcium: 9.6 mg/dL (ref 8.7–10.3)
Chloride: 109 mmol/L — ABNORMAL HIGH (ref 96–106)
Creatinine, Ser: 1.68 mg/dL — ABNORMAL HIGH (ref 0.57–1.00)
GFR calc Af Amer: 36 mL/min/{1.73_m2} — ABNORMAL LOW (ref >59)
GFR calc non Af Amer: 31 mL/min/{1.73_m2} — ABNORMAL LOW (ref >59)
Globulin, Total: 2 g/dL (ref 1.5–4.5)
Glucose: 79 mg/dL (ref 65–99)
Potassium: 4.7 mmol/L (ref 3.5–5.2)
Sodium: 145 mmol/L — ABNORMAL HIGH (ref 134–144)
Total Protein: 6.3 g/dL (ref 6.0–8.5)

## 2020-01-05 LAB — HEMOGLOBIN A1C
Est. average glucose Bld gHb Est-mCnc: 103 mg/dL
Hgb A1c MFr Bld: 5.2 % (ref 4.8–5.6)

## 2020-01-05 LAB — VITAMIN D 25 HYDROXY (VIT D DEFICIENCY, FRACTURES): Vit D, 25-Hydroxy: 49.3 ng/mL (ref 30.0–100.0)

## 2020-01-05 NOTE — Progress Notes (Signed)
This patient presents the office for an evaluation and treatment of her great toenail left foot.  She says she has had difficulty with her diabetic shoes which resulted in a contusion of the left great toe.  The nail presently is unattached for approximately 50% of the nail bed.  She has been seen by Dr. Paulla Dolly in [place] who told her that she needs to wait and allow the nail to grow.  She presents the office today for another evaluation of this left great toe.  She says that her shoe problem has been resolved.  Patient has a history of diabetic neuropathy.   Vascular  Dorsalis pedis and posterior tibial pulses are palpable  B/L.  Capillary return  WNL.  Temperature gradient is  WNL.  Skin turgor  WNL  Sensorium  Senn Weinstein monofilament wire  Absent.. Absent  tactile sensation.  Nail Exam  Patient has normal nails with no evidence of bacterial or fungal infection. The left hallux nail plate is unattached from nail bed.  There is evidence of subungual hematoema  Orthopedic  Exam  .  No limitations of motion feet  B/L.  No crepitus or joint effusion noted.  Foot type is unremarkable and digits show no abnormalities.  Bony prominences are unremarkable.  Skin  No open lesions.  Normal skin texture and turgor.  Contusion left hallux  Diabetic neuropathy.   ROV.  Examination of her left hallux nail plate was performed and the nail is loosely attached.  I discussed this matter with the patient and told her that the better treatment would be to allow the nail to continue to grow and to watch it in the future.  Since there is no evidence of any infection I do not believe any aggressive treatment needs to be provided today.  Patient to call the office if the problem occurs.  Meanwhile she should make an appointment for the office for 4 months.   Gardiner Barefoot DPM

## 2020-01-10 ENCOUNTER — Other Ambulatory Visit (INDEPENDENT_AMBULATORY_CARE_PROVIDER_SITE_OTHER): Payer: Self-pay | Admitting: Family Medicine

## 2020-01-10 DIAGNOSIS — F3289 Other specified depressive episodes: Secondary | ICD-10-CM

## 2020-01-11 ENCOUNTER — Encounter (INDEPENDENT_AMBULATORY_CARE_PROVIDER_SITE_OTHER): Payer: Self-pay | Admitting: Family Medicine

## 2020-01-11 NOTE — Progress Notes (Signed)
Chief Complaint:   OBESITY Darlene [name] is here to discuss her progress with her obesity treatment plan along with follow-up of her obesity related diagnoses. Darlene [name] is on keeping a food journal and adhering to recommended goals of 1600-1800 calories and 85+ grams of protein daily and states she is following her eating plan approximately 0% of the time. Darlene [name] states she is doing 0 minutes 0 times per week.  Today's visit was #: 68 Starting weight: 225 lbs  Starting date: 08/09/2016 Today's weight: 169 lbs Today's date: 01/04/2020 Total lbs lost to date: 56 Total lbs lost since last in-office visit: 0  Interim History: Darlene [name] is in maintenance and she has done very well avoiding weight gain. She has been caring for her husband who is going through cancer treatment. She is cooking for him to help keep his weight up.  Subjective:   1. Hypertension associated with type 2 diabetes mellitus ([place]) Darlene [name]'s blood pressure is well controlled with diet and medications. She denies signs of hypotension.  2. Type 2 diabetes mellitus with diabetic nephropathy, with long-term current use of insulin (Darlene [name]) Darlene [name] is working on diet and exercise, and she denies hypoglycemia. She requests a refill today.  3. Hyperlipidemia associated with type 2 diabetes mellitus (Darlene [name]) Darlene [name] is working on diet and weight loss. She is due for labs.  4. Other depression Darlene [name]'s mood is stable on her medications, and she denies signs of serotonin syndrome or elevated blood pressure. She has had a lot of stressors with caring for her sick husband.  5. Vitamin D deficiency Darlene [name] is on Vit D, and she is due for labs to see if she is at goal.  Assessment/Plan:   1. Hypertension associated with type 2 diabetes mellitus (Darlene [name]) Darlene [name] is working on healthy weight loss and exercise to improve blood pressure control. We will watch for signs of hypotension as she continues her lifestyle modifications. We  will check labs today, and we will refill losartan for 90 days with no refills.  - Comprehensive metabolic panel - losartan (COZAAR) 100 MG tablet; Take 1 tablet (100 mg total) by mouth daily.  Dispense: 90 tablet; Refill: 0  2. Type 2 diabetes mellitus with diabetic nephropathy, with long-term current use of insulin (Darlene [name]) Good blood sugar control is important to decrease the likelihood of diabetic complications such as nephropathy, neuropathy, limb loss, blindness, coronary artery disease, and death. Intensive lifestyle modification including diet, exercise and weight loss are the first line of treatment for diabetes. We will check labs today. We will refill lantus, pen needles, Victoza, and glucometer strips for 90 days with no refills.  - Hemoglobin A1c - Insulin, random - insulin glargine (LANTUS SOLOSTAR) 100 UNIT/ML Solostar Pen; Inject 10 Units into the skin daily.  Dispense: 15 mL; Refill: 0 - Insulin Pen Needle (SURE COMFORT PEN NEEDLES) 32G X 4 MM MISC; Use as instructed to inject insulin and Victoza daily.  Dispense: 200 each; Refill: 0 - liraglutide (VICTOZA) 18 MG/3ML SOPN; Pt to take 1.8 mg qam  Dispense: 27 mL; Refill: 0 - glucose blood (ACCU-CHEK GUIDE) test strip; CHECK BLOOD SUGAR 4 TIMES A DAY.  Dispense: 400 each; Refill: 0  3. Hyperlipidemia associated with type 2 diabetes mellitus (Darlene [name]) Cardiovascular risk and specific lipid/LDL goals reviewed.  We discussed several lifestyle modifications today. We will check labs today. [name] will continue to work on diet, exercise and weight loss efforts. Orders and follow up as documented in patient record.   4. Other  depression Behavior modification techniques were discussed today to help [name] deal with her emotional/non-hunger eating behaviors. We will refill Topamax, Trintellix, and Wellbutrin SR for 90 days with no refills. Orders and follow up as documented in patient record.   - vortioxetine HBr (TRINTELLIX) 20 MG TABS  tablet; Take 1 tablet (20 mg total) by mouth at bedtime.  Dispense: 90 tablet; Refill: 0 - topiramate (TOPAMAX) 25 MG tablet; Take 1 tablet (25 mg total) by mouth daily.  Dispense: 90 tablet; Refill: 0 - buPROPion (WELLBUTRIN SR) 200 MG 12 hr tablet; Take one tablet by mouth once daily.  Dispense: 90 tablet; Refill: 0  5. Vitamin D deficiency Low Vitamin D level contributes to fatigue and are associated with obesity, breast, and colon cancer. We will check labs today. Darlene [name] will follow-up for routine testing of Vitamin D, at least 2-3 times per year to avoid over-replacement.  - VITAMIN D 25 Hydroxy (Vit-D Deficiency, Fractures)  6. Class 1 obesity with serious comorbidity and body mass index (BMI) of 30.0 to 30.9 in adult, unspecified obesity type Darlene [name] is currently in the action stage of change. As such, her goal is to continue with weight loss efforts. She has agreed to keeping a food journal and adhering to recommended goals of 1600-1800 calories and 85+ grams of protein daily.   Behavioral modification strategies: holiday eating strategies .  Darlene [name] has agreed to follow-up with our clinic in 3 months. She was informed of the importance of frequent follow-up visits to maximize her success with intensive lifestyle modifications for her multiple health conditions.   Darlene [name] was informed we would discuss her lab results at her next visit unless there is a critical issue that needs to be addressed sooner. Darlene [name] agreed to keep her next visit at the agreed upon time to discuss these results.  Objective:   Blood pressure 115/68, pulse 65, temperature 97.6 F (36.4 C), height 5\' 9"  (1.753 m), weight 169 lb (76.7 kg), last menstrual period 10/22/2007, SpO2 95 %. Body mass index is 24.96 kg/m.  General: Cooperative, alert, well developed, in no acute distress. HEENT: Conjunctivae and lids unremarkable. Cardiovascular: Regular rhythm.  Lungs: Normal work of breathing. Neurologic: No  focal deficits.   Lab Results  Component Value Date   CREATININE 1.68 (H) 01/04/2020   BUN 33 (H) 01/04/2020   NA 145 (H) 01/04/2020   K 4.7 01/04/2020   CL 109 (H) 01/04/2020   CO2 24 01/04/2020   Lab Results  Component Value Date   ALT 16 01/04/2020   AST 24 01/04/2020   ALKPHOS 59 01/04/2020   BILITOT 0.4 01/04/2020   Lab Results  Component Value Date   HGBA1C 5.2 01/04/2020   HGBA1C 5.4 10/05/2019   HGBA1C 5.6 05/11/2019   HGBA1C 5.5 01/25/2019   HGBA1C 5.4 08/27/2018   Lab Results  Component Value Date   INSULIN 7.6 01/25/2019   INSULIN 15.3 03/11/2017   INSULIN 18.3 08/09/2016   Lab Results  Component Value Date   TSH 1.54 02/07/2017   Lab Results  Component Value Date   CHOL 88 (L) 10/25/2019   HDL 58 10/25/2019   LDLCALC 18 10/25/2019   TRIG 48 10/25/2019   CHOLHDL 1.5 10/25/2019   Lab Results  Component Value Date   WBC 5.0 12/22/2019   HGB 9.9 (L) 12/22/2019   HCT 28.8 (L) 12/22/2019   MCV 94.1 12/22/2019   PLT 189 12/22/2019   Lab Results  Component Value Date   IRON 58 02/18/2013  TIBC 305 02/18/2013   FERRITIN 133 10/31/2014   Attestation Statements:   Reviewed by clinician on day of visit: allergies, medications, problem list, medical history, surgical history, family history, social history, and previous encounter notes.  Time spent on visit including pre-visit chart review and post-visit care and charting was 46 minutes.    I, [name], am acting as transcriptionist for Dennard Nip, MD.  I have reviewed the above documentation for accuracy and completeness, and I agree with the above. -  Dennard Nip, MD

## 2020-01-12 ENCOUNTER — Other Ambulatory Visit (INDEPENDENT_AMBULATORY_CARE_PROVIDER_SITE_OTHER): Payer: Self-pay | Admitting: Family Medicine

## 2020-01-12 DIAGNOSIS — F3289 Other specified depressive episodes: Secondary | ICD-10-CM

## 2020-01-12 NOTE — Telephone Encounter (Signed)
Dr.Beasley 

## 2020-01-14 ENCOUNTER — Other Ambulatory Visit (INDEPENDENT_AMBULATORY_CARE_PROVIDER_SITE_OTHER): Payer: Self-pay | Admitting: Family Medicine

## 2020-01-14 DIAGNOSIS — F3289 Other specified depressive episodes: Secondary | ICD-10-CM

## 2020-01-18 DIAGNOSIS — C50911 Malignant neoplasm of unspecified site of right female breast: Secondary | ICD-10-CM | POA: Diagnosis not present

## 2020-01-19 ENCOUNTER — Ambulatory Visit (HOSPITAL_COMMUNITY)
Admission: RE | Admit: 2020-01-19 | Discharge: 2020-01-19 | Disposition: A | Discharge status: Home / Self Care | Payer: Medicare PPO | Source: Ambulatory Visit | Attending: Oncology | Admitting: Oncology

## 2020-01-19 ENCOUNTER — Inpatient Hospital Stay: Payer: Medicare PPO

## 2020-01-19 ENCOUNTER — Other Ambulatory Visit: Payer: Self-pay

## 2020-01-19 VITALS — BP 140/57 | HR 67 | Temp 98.4°F | Resp 18

## 2020-01-19 DIAGNOSIS — C50911 Malignant neoplasm of unspecified site of right female breast: Secondary | ICD-10-CM

## 2020-01-19 DIAGNOSIS — C50411 Malignant neoplasm of upper-outer quadrant of right female breast: Secondary | ICD-10-CM

## 2020-01-19 DIAGNOSIS — Z7189 Other specified counseling: Secondary | ICD-10-CM | POA: Diagnosis not present

## 2020-01-19 DIAGNOSIS — C787 Secondary malignant neoplasm of liver and intrahepatic bile duct: Secondary | ICD-10-CM

## 2020-01-19 DIAGNOSIS — C229 Malignant neoplasm of liver, not specified as primary or secondary: Secondary | ICD-10-CM | POA: Diagnosis not present

## 2020-01-19 DIAGNOSIS — C7981 Secondary malignant neoplasm of breast: Secondary | ICD-10-CM | POA: Diagnosis not present

## 2020-01-19 DIAGNOSIS — Z95828 Presence of other vascular implants and grafts: Secondary | ICD-10-CM

## 2020-01-19 DIAGNOSIS — C801 Malignant (primary) neoplasm, unspecified: Secondary | ICD-10-CM

## 2020-01-19 DIAGNOSIS — Z171 Estrogen receptor negative status [ER-]: Secondary | ICD-10-CM

## 2020-01-19 DIAGNOSIS — Z5112 Encounter for antineoplastic immunotherapy: Secondary | ICD-10-CM | POA: Diagnosis not present

## 2020-01-19 DIAGNOSIS — K8020 Calculus of gallbladder without cholecystitis without obstruction: Secondary | ICD-10-CM | POA: Diagnosis not present

## 2020-01-19 LAB — CBC WITH DIFFERENTIAL/PLATELET
Abs Immature Granulocytes: 0.03 10*3/uL (ref 0.00–0.07)
Basophils Absolute: 0 10*3/uL (ref 0.0–0.1)
Basophils Relative: 0 %
Eosinophils Absolute: 0.2 10*3/uL (ref 0.0–0.5)
Eosinophils Relative: 3 %
HCT: 32.3 % — ABNORMAL LOW (ref 36.0–46.0)
Hemoglobin: 11 g/dL — ABNORMAL LOW (ref 12.0–15.0)
Immature Granulocytes: 1 %
Lymphocytes Relative: 22 %
Lymphs Abs: 1.3 10*3/uL (ref 0.7–4.0)
MCH: 31.8 pg (ref 26.0–34.0)
MCHC: 34.1 g/dL (ref 30.0–36.0)
MCV: 93.4 fL (ref 80.0–100.0)
Monocytes Absolute: 0.4 10*3/uL (ref 0.1–1.0)
Monocytes Relative: 7 %
Neutro Abs: 4.1 10*3/uL (ref 1.7–7.7)
Neutrophils Relative %: 67 %
Platelets: 201 10*3/uL (ref 150–400)
RBC: 3.46 MIL/uL — ABNORMAL LOW (ref 3.87–5.11)
RDW: 12.2 % (ref 11.5–15.5)
WBC: 6.1 10*3/uL (ref 4.0–10.5)
nRBC: 0 % (ref 0.0–0.2)

## 2020-01-19 LAB — COMPREHENSIVE METABOLIC PANEL
ALT: 16 U/L (ref 0–44)
AST: 24 U/L (ref 15–41)
Albumin: 4 g/dL (ref 3.5–5.0)
Alkaline Phosphatase: 51 U/L (ref 38–126)
Anion gap: 6 (ref 5–15)
BUN: 39 mg/dL — ABNORMAL HIGH (ref 8–23)
CO2: 25 mmol/L (ref 22–32)
Calcium: 9.5 mg/dL (ref 8.9–10.3)
Chloride: 112 mmol/L — ABNORMAL HIGH (ref 98–111)
Creatinine, Ser: 1.97 mg/dL — ABNORMAL HIGH (ref 0.44–1.00)
GFR, Estimated: 27 mL/min — ABNORMAL LOW (ref >60)
Glucose, Bld: 134 mg/dL — ABNORMAL HIGH (ref 70–99)
Potassium: 4.4 mmol/L (ref 3.5–5.1)
Sodium: 143 mmol/L (ref 135–145)
Total Bilirubin: 0.5 mg/dL (ref 0.3–1.2)
Total Protein: 6.7 g/dL (ref 6.5–8.1)

## 2020-01-19 MED ORDER — TRASTUZUMAB-ANNS CHEMO 420 MG IV SOLR
450.0000 mg | Freq: Once | INTRAVENOUS | Status: AC
  Administered 2020-01-19: 450 mg via INTRAVENOUS
  Filled 2020-01-19: qty 21.43

## 2020-01-19 MED ORDER — ACETAMINOPHEN 325 MG PO TABS
ORAL_TABLET | ORAL | Status: AC
  Filled 2020-01-19: qty 2

## 2020-01-19 MED ORDER — SODIUM CHLORIDE 0.9% FLUSH
10.0000 mL | INTRAVENOUS | Status: DC | PRN
  Administered 2020-01-19: 10 mL
  Filled 2020-01-19: qty 10

## 2020-01-19 MED ORDER — SODIUM CHLORIDE 0.9 % IV SOLN
Freq: Once | INTRAVENOUS | Status: AC
  Filled 2020-01-19: qty 250

## 2020-01-19 MED ORDER — HEPARIN SOD (PORK) LOCK FLUSH 100 UNIT/ML IV SOLN
500.0000 [IU] | Freq: Once | INTRAVENOUS | Status: AC | PRN
Start: 2020-01-19 — End: 2020-01-19
  Administered 2020-01-19: 500 [IU]
  Filled 2020-01-19: qty 5

## 2020-01-19 MED ORDER — ACETAMINOPHEN 325 MG PO TABS
650.0000 mg | ORAL_TABLET | Freq: Once | ORAL | Status: AC
  Administered 2020-01-19: 650 mg via ORAL

## 2020-01-19 MED ORDER — DIPHENHYDRAMINE HCL 25 MG PO CAPS
ORAL_CAPSULE | ORAL | Status: AC
  Filled 2020-01-19: qty 1

## 2020-01-19 MED ORDER — SODIUM CHLORIDE 0.9 % IV SOLN
420.0000 mg | Freq: Once | INTRAVENOUS | Status: AC
  Administered 2020-01-19: 420 mg via INTRAVENOUS
  Filled 2020-01-19: qty 14

## 2020-01-19 MED ORDER — DIPHENHYDRAMINE HCL 25 MG PO CAPS
25.0000 mg | ORAL_CAPSULE | Freq: Once | ORAL | Status: AC
  Administered 2020-01-19: 25 mg via ORAL

## 2020-01-19 NOTE — Patient Instructions (Signed)
[facility] Discharge Instructions for Patients Receiving Chemotherapy  Today you received the following chemotherapy agents: Trastuzumab and Pertuzumab  To help prevent nausea and vomiting after your treatment, we encourage you to take your nausea medication  as prescribed.    If you develop nausea and vomiting that is not controlled by your nausea medication, call the clinic.   BELOW ARE SYMPTOMS THAT SHOULD BE REPORTED IMMEDIATELY:  *FEVER GREATER THAN 100.5 F  *CHILLS WITH OR WITHOUT FEVER  NAUSEA AND VOMITING THAT IS NOT CONTROLLED WITH YOUR NAUSEA MEDICATION  *UNUSUAL SHORTNESS OF BREATH  *UNUSUAL BRUISING OR BLEEDING  TENDERNESS IN MOUTH AND THROAT WITH OR WITHOUT PRESENCE OF ULCERS  *URINARY PROBLEMS  *BOWEL PROBLEMS  UNUSUAL RASH Items with * indicate a potential emergency and should be followed up as soon as possible.  Feel free to call the clinic should you have any questions or concerns. The clinic phone number is [phone].  Please show the CHEMO ALERT CARD at check-in to the Emergency Department and triage nurse.   

## 2020-01-19 NOTE — Progress Notes (Signed)
Patient did not wish to stay for their 30 minute post-observation period following her pertuzumab infusion. VS retaken and remained stable. Patient in no acute distress at discharge.

## 2020-01-21 ENCOUNTER — Encounter: Payer: Self-pay | Admitting: Neurology

## 2020-01-25 ENCOUNTER — Encounter: Payer: Self-pay | Admitting: Oncology

## 2020-02-04 ENCOUNTER — Other Ambulatory Visit: Payer: Self-pay

## 2020-02-04 ENCOUNTER — Telehealth (INDEPENDENT_AMBULATORY_CARE_PROVIDER_SITE_OTHER): Payer: Medicare PPO | Admitting: Neurology

## 2020-02-04 ENCOUNTER — Encounter: Payer: Self-pay | Admitting: Neurology

## 2020-02-04 VITALS — Wt 172.0 lb

## 2020-02-04 DIAGNOSIS — G589 Mononeuropathy, unspecified: Secondary | ICD-10-CM | POA: Diagnosis not present

## 2020-02-04 DIAGNOSIS — R252 Cramp and spasm: Secondary | ICD-10-CM

## 2020-02-04 DIAGNOSIS — M21372 Foot drop, left foot: Secondary | ICD-10-CM | POA: Diagnosis not present

## 2020-02-04 DIAGNOSIS — M21371 Foot drop, right foot: Secondary | ICD-10-CM

## 2020-02-04 DIAGNOSIS — Z170 Estrogen receptor positive status [ER+]: Secondary | ICD-10-CM | POA: Diagnosis not present

## 2020-02-04 DIAGNOSIS — C50319 Malignant neoplasm of lower-inner quadrant of unspecified female breast: Secondary | ICD-10-CM

## 2020-02-04 NOTE — Progress Notes (Signed)
   Due to the COVID-19 crisis, this telephone visit was done via telephone from my office and it was initiated and consent given by this patient and or family.  Telephone (Audio) Visit The purpose of this telephone visit is to provide medical care while limiting exposure to the novel coronavirus.    Consent was obtained for telephone visit and initiated by pt/family:  Yes.   Answered questions that patient had about telehealth interaction:  Yes.   I discussed the limitations, risks, security and privacy concerns of performing an evaluation and management service by telephone. I also discussed with the patient that there may be a patient responsible charge related to this service. The patient expressed understanding and agreed to proceed.  Pt location: Home Physician Location: office Name of referring provider:  Vernie Shanks, MD I connected with .Darlene [name] at patients initiation/request on 02/04/2020 at 10:30 AM EST by telephone and verified that I am speaking with the correct person using two identifiers.  Pt MRN:  [id number] Pt DOB:  [id number]  Assessment/Plan:  Bilateral foot drop, now with muscle cramps - worsening MRI lumbar wwo constrast to assess for structural pathology, contrasted study will be ordered given her history of breast cancer  Severe sensorimotor polyneuropathy due to diabetes and chemotherapy.  Although this may explain her foot drop, it would not explain muscle cramps.  Prior NCS/EMG of the legs from 02/2019 shows severe axonal neuropathy in the feet, as well as severe peroneal neuropathy at the fibular head, left > right. She completed PT which helped transiently.   Need for in person visit now:  No.  Subjective:  This is a 70 year-old female with insulin-dependent diabetes mellitus, gastric bypass (2015 weight 130lb weight loss), right breast cancer s/p mastectomy, chemotherapy, and radiation (2016) with liver metastasis in 2017, and hypertensionreturning  for follow-up of bilateral foot drop and neuropathy.  Over the past several months, she feels that her feet have become weaker and she is unable to raise her toes.  She has been compliant with using AFOs. She also complains of painful cramps in the legs, which is triggered by stretching her arms and legs, such as when awakening in the morning. Earlier this week, she suffered a fall while rushing to the front door, and landed on her left side, resulting in left hip pain and hematoma. She has been using her cane in her home now.    Objective:   Vitals:   02/04/20 0959  Weight: 172 lb (78 kg)     Follow Up Instructions:      -I discussed the assessment and treatment plan with the patient. The patient was provided an opportunity to ask questions and all were answered. The patient agreed with the plan and demonstrated an understanding of the instructions.   The patient was advised to call back or seek an in-person evaluation if the symptoms worsen or if the condition fails to improve as anticipated.    Total Time spent in visit with the patient was:  16 min, of which 100% of the time was spent in counseling and/or coordinating care.   Pt understands and agrees with the plan of care outlined.     Alda Berthold, DO

## 2020-02-15 DIAGNOSIS — G4733 Obstructive sleep apnea (adult) (pediatric): Secondary | ICD-10-CM | POA: Diagnosis not present

## 2020-02-16 ENCOUNTER — Inpatient Hospital Stay: Payer: Medicare PPO

## 2020-02-16 ENCOUNTER — Other Ambulatory Visit: Payer: Self-pay

## 2020-02-16 ENCOUNTER — Inpatient Hospital Stay: Payer: Medicare PPO | Attending: Oncology | Admitting: Oncology

## 2020-02-16 VITALS — BP 139/74 | HR 67 | Temp 97.9°F | Resp 18 | Ht 69.0 in | Wt 178.2 lb

## 2020-02-16 DIAGNOSIS — C787 Secondary malignant neoplasm of liver and intrahepatic bile duct: Secondary | ICD-10-CM

## 2020-02-16 DIAGNOSIS — E119 Type 2 diabetes mellitus without complications: Secondary | ICD-10-CM | POA: Diagnosis not present

## 2020-02-16 DIAGNOSIS — Z171 Estrogen receptor negative status [ER-]: Secondary | ICD-10-CM | POA: Insufficient documentation

## 2020-02-16 DIAGNOSIS — C801 Malignant (primary) neoplasm, unspecified: Secondary | ICD-10-CM

## 2020-02-16 DIAGNOSIS — Z5112 Encounter for antineoplastic immunotherapy: Secondary | ICD-10-CM | POA: Insufficient documentation

## 2020-02-16 DIAGNOSIS — C50411 Malignant neoplasm of upper-outer quadrant of right female breast: Secondary | ICD-10-CM | POA: Diagnosis not present

## 2020-02-16 DIAGNOSIS — Z7189 Other specified counseling: Secondary | ICD-10-CM

## 2020-02-16 DIAGNOSIS — C50911 Malignant neoplasm of unspecified site of right female breast: Secondary | ICD-10-CM

## 2020-02-16 DIAGNOSIS — Z95828 Presence of other vascular implants and grafts: Secondary | ICD-10-CM

## 2020-02-16 LAB — COMPREHENSIVE METABOLIC PANEL
ALT: 11 U/L (ref 0–44)
AST: 23 U/L (ref 15–41)
Albumin: 3.9 g/dL (ref 3.5–5.0)
Alkaline Phosphatase: 49 U/L (ref 38–126)
Anion gap: 6 (ref 5–15)
BUN: 37 mg/dL — ABNORMAL HIGH (ref 8–23)
CO2: 24 mmol/L (ref 22–32)
Calcium: 9.1 mg/dL (ref 8.9–10.3)
Chloride: 112 mmol/L — ABNORMAL HIGH (ref 98–111)
Creatinine, Ser: 1.85 mg/dL — ABNORMAL HIGH (ref 0.44–1.00)
GFR, Estimated: 30 mL/min — ABNORMAL LOW (ref >60)
Glucose, Bld: 79 mg/dL (ref 70–99)
Potassium: 4.1 mmol/L (ref 3.5–5.1)
Sodium: 142 mmol/L (ref 135–145)
Total Bilirubin: 0.5 mg/dL (ref 0.3–1.2)
Total Protein: 6.4 g/dL — ABNORMAL LOW (ref 6.5–8.1)

## 2020-02-16 LAB — CBC WITH DIFFERENTIAL/PLATELET
Abs Immature Granulocytes: 0.01 10*3/uL (ref 0.00–0.07)
Basophils Absolute: 0 10*3/uL (ref 0.0–0.1)
Basophils Relative: 0 %
Eosinophils Absolute: 0.1 10*3/uL (ref 0.0–0.5)
Eosinophils Relative: 3 %
HCT: 30.2 % — ABNORMAL LOW (ref 36.0–46.0)
Hemoglobin: 10 g/dL — ABNORMAL LOW (ref 12.0–15.0)
Immature Granulocytes: 0 %
Lymphocytes Relative: 25 %
Lymphs Abs: 1.2 10*3/uL (ref 0.7–4.0)
MCH: 31.6 pg (ref 26.0–34.0)
MCHC: 33.1 g/dL (ref 30.0–36.0)
MCV: 95.6 fL (ref 80.0–100.0)
Monocytes Absolute: 0.4 10*3/uL (ref 0.1–1.0)
Monocytes Relative: 8 %
Neutro Abs: 3 10*3/uL (ref 1.7–7.7)
Neutrophils Relative %: 64 %
Platelets: 179 10*3/uL (ref 150–400)
RBC: 3.16 MIL/uL — ABNORMAL LOW (ref 3.87–5.11)
RDW: 12.9 % (ref 11.5–15.5)
WBC: 4.7 10*3/uL (ref 4.0–10.5)
nRBC: 0 % (ref 0.0–0.2)

## 2020-02-16 LAB — CEA (IN HOUSE-CHCC): CEA (CHCC-In House): 2.09 ng/mL (ref 0.00–5.00)

## 2020-02-16 MED ORDER — SODIUM CHLORIDE 0.9 % IV SOLN
420.0000 mg | Freq: Once | INTRAVENOUS | Status: AC
  Administered 2020-02-16: 420 mg via INTRAVENOUS
  Filled 2020-02-16: qty 14

## 2020-02-16 MED ORDER — HEPARIN SOD (PORK) LOCK FLUSH 100 UNIT/ML IV SOLN
500.0000 [IU] | Freq: Once | INTRAVENOUS | Status: AC | PRN
  Administered 2020-02-16: 500 [IU]
  Filled 2020-02-16: qty 5

## 2020-02-16 MED ORDER — DIPHENHYDRAMINE HCL 25 MG PO CAPS
ORAL_CAPSULE | ORAL | Status: AC
  Filled 2020-02-16: qty 1

## 2020-02-16 MED ORDER — DIPHENHYDRAMINE HCL 25 MG PO CAPS
25.0000 mg | ORAL_CAPSULE | Freq: Once | ORAL | Status: AC
  Administered 2020-02-16: 25 mg via ORAL

## 2020-02-16 MED ORDER — SODIUM CHLORIDE 0.9 % IV SOLN
Freq: Once | INTRAVENOUS | Status: AC
Start: 2020-02-16 — End: 2020-02-16
  Filled 2020-02-16: qty 250

## 2020-02-16 MED ORDER — ACETAMINOPHEN 325 MG PO TABS
ORAL_TABLET | ORAL | Status: AC
  Filled 2020-02-16: qty 2

## 2020-02-16 MED ORDER — ACETAMINOPHEN 325 MG PO TABS
650.0000 mg | ORAL_TABLET | Freq: Once | ORAL | Status: AC
  Administered 2020-02-16: 650 mg via ORAL

## 2020-02-16 MED ORDER — SODIUM CHLORIDE 0.9% FLUSH
10.0000 mL | INTRAVENOUS | Status: DC | PRN
  Administered 2020-02-16: 10 mL via INTRAVENOUS
  Filled 2020-02-16: qty 10

## 2020-02-16 MED ORDER — TRASTUZUMAB-ANNS CHEMO 420 MG IV SOLR
450.0000 mg | Freq: Once | INTRAVENOUS | Status: AC
  Administered 2020-02-16: 450 mg via INTRAVENOUS
  Filled 2020-02-16: qty 21.43

## 2020-02-16 MED ORDER — SODIUM CHLORIDE 0.9% FLUSH
10.0000 mL | INTRAVENOUS | Status: DC | PRN
Start: 2020-02-16 — End: 2020-02-16
  Administered 2020-02-16: 10 mL
  Filled 2020-02-16: qty 10

## 2020-02-16 NOTE — Progress Notes (Signed)
[place]  [phone] [phone]    ID: Darlene [name] DOB: [birth year]  MR#: [id number]  BWI#:[id number]  Patient Care Team: Vernie Shanks, MD as PCP - General (Family Medicine) Larey Dresser, MD as PCP - Advanced Heart Failure (Cardiology) Stark Klein, MD as Consulting Physician (General Surgery) General Wearing, Virgie Dad, MD as Consulting Physician (Oncology) Jacelyn Pi, MD as Consulting Physician (Endocrinology) [name], MD as Consulting Physician (Plastic Surgery) Gery Pray, MD as Consulting Physician (Radiation Oncology) Juanita Craver, MD as Consulting Physician (Gastroenterology) Alda Berthold, DO as Consulting Physician (Neurology) Starlyn Skeans, MD as Consulting Physician (Family Medicine) Sherlynn Stalls, MD as Consulting Physician (Ophthalmology) Salvadore Dom, MD as Consulting Physician (Obstetrics and Gynecology)  OTHER MD:   CHIEF COMPLAINT:  HER-2 positive, estrogen receptor negative breast cancer  CURRENT TREATMENT:  Trastuzumab; Pertuzumab   INTERVAL HISTORY:  Darlene [name] returns today for follow-up and treatment of her estrogen receptor negative but HER-2 amplified breast cancer.    She continues on Trastuzumab/Pertuzumab every 4 weeks.  She is tolerating this well, specifically with no diarrhea problems.  She underwent repeat echocardiogram on 11/03/2019 showing an ejection fraction of 55-60%. This is stable from prior in 06/2019.  She also underwent abdomen ultrasound on 01/19/2020 showing: previously seen subtle 6 mm T2 hypointense lesion in dome of liver is not well seen; no new lesion is apparent.   REVIEW OF SYSTEMS: Darlene [name] fell injuring her left hip area and developed a severe hematoma she wanted me to look at today.  She "took it easy" over the holidays and maintained appropriate precautions.  Her peripheral neuropathy continues to be a severe issue for her.  She tells me she is scheduled for  an MRI of the lumbar spine this weekend with follow-up through her orthopedist.  Detailed review of systems was otherwise stable.   COVID 19 VACCINATION STATUS: Status post [employer] x2, with booster September 2021  BREAST CANCER HISTORY: From the original intake note:  "Darlene [name]" had screening mammography at her gynecologist suggestive of a possible abnormality in the right breast. However right diagnostic mammogram 04/12/2013 at [facility] was negative. More recently however, she noted a lump in her right breast and brought it to her gynecologist's attention. On 07/18/2014 the patient had bilateral diagnostic mammography with tomosynthesis at [facility]. The breast density was category B. In the right breast upper outer quadrant there was an area of focal asymmetry with indistinct margins.[name] Ultrasound was obtained and showed in addition to the mass in question, measuring 1.7 cm, an abnormal-appearing lymph node in the right axillary tail.  On 07/19/2014 the patient underwent biopsy of the right breast massin question as well as a right axillary lymph node. Both were positive for invasive ductal carcinoma, grade 2, estrogen and progesterone receptor negative, with an MIB-1 of 90%, and HER-2 pending. Incidentally the lymph node biopsy showed a lymphocytic inflammatory component but no lymph node architecture.  Her subsequent history is as detailed above.   PAST MEDICAL HISTORY: Past Medical History:  Diagnosis Date  . Anemia    hx of - during 1st round chemo  . Anxiety   . Arthritis    in fingers, shoulders  . Back pain   . Balance problem   . Breast cancer ([place])   . Breast cancer of upper-outer quadrant of right female breast ([place]) 07/22/2014  . Bunion, right foot   . Chronic kidney disease    creatininei levels high per pt   .  Clumsiness   . Constipation   . Depression   . Diabetes mellitus without complication ([place])    01+ years type 2   . Diabetic retinopathy ([place])    torn retina  . Eating  disorder    binge eating  . Epistaxis 08/26/2014  . Fatty liver   . Foot drop, left foot   . HCAP (healthcare-associated pneumonia) 12/18/2014  . Heart murmur    never had any problems  . History of radiation therapy 04/05/15-05/19/15   right chest wall was treated to 50.4 Gy in 28 fractions, right mastectomy scar was treated to 10 Gy in 5 fractions  . Hyperlipidemia   . Hypertension   . Joint pain   . Multiple food allergies   . Neuromuscular disorder ([place])    diabetic neuropathy  . Obesity   . Obstructive sleep apnea on CPAP    does not use cpap all the time has lost 160 lbs   . Ovarian cyst rupture    possible  . Pneumonia   . Pneumonia 11/2014  . Radiation 04/05/15-05/19/15   right chest wall 50.4 Gy, mastectomy scar 10 Gy  . Sleep apnea   . Thyroid nodule 06/2014    PAST SURGICAL HISTORY: Past Surgical History:  Procedure Laterality Date  . APPENDECTOMY    . BIOPSY THYROID Bilateral 06/2014   2 nodules - Benign   . BREAST LUMPECTOMY WITH RADIOACTIVE SEED AND SENTINEL LYMPH NODE BIOPSY Right 12/27/2014   Procedure: BREAST LUMPECTOMY WITH RADIOACTIVE SEED AND SENTINEL LYMPH NODE BIOPSY;  Surgeon: Stark Klein, MD;  Location: [place];  Service: General;  Laterality: Right;  . BREAST REDUCTION SURGERY Bilateral 01/06/2015   Procedure: BILATERAL BREAST REDUCTION, RIGHT ONCOPLASTIC RECONSTRUCTION),LEFT BREAST REDUCTION FOR SYMMETRY;  Surgeon: [name], MD;  Location: [place];  Service: Plastics;  Laterality: Bilateral;  . BREATH TEK H PYLORI N/A 09/15/2012   Procedure: BREATH TEK H PYLORI;  Surgeon: Pedro Earls, MD;  Location: [facility];  Service: General;  Laterality: N/A;  . BUNIONECTOMY Left 12/2013  . CARPAL TUNNEL RELEASE Right   . CARPAL TUNNEL RELEASE Left   . CATARACT EXTRACTION     6/19  . COLONOSCOPY    . DUPUYTREN CONTRACTURE RELEASE    . GASTRIC ROUX-EN-Y N/A 11/02/2012   Procedure: LAPAROSCOPIC ROUX-EN-Y GASTRIC;  Surgeon: Pedro Earls, MD;  Location: [facility];  Service: General;  Laterality: N/A;  . IR GENERIC HISTORICAL  03/18/2016   IR US GUIDE VASC ACCESS LEFT 03/18/2016 Markus Daft, MD [facility]  . IR GENERIC HISTORICAL  03/18/2016   IR FLUORO GUIDE CV LINE LEFT 03/18/2016 Markus Daft, MD [facility]  . LAPAROSCOPIC APPENDECTOMY N/A 07/22/2015   Procedure: APPENDECTOMY LAPAROSCOPIC;  Surgeon: Michael Boston, MD;  Location: [facility];  Service: General;  Laterality: N/A;  . MASTECTOMY Right 02/15/2015   modified  . MODIFIED MASTECTOMY Right 02/15/2015   Procedure: RIGHT MODIFIED RADICAL MASTECTOMY;  Surgeon: Stark Klein, MD;  Location: [place];  Service: General;  Laterality: Right;  . ORIF WRIST FRACTURE Left 05/11/2019   Procedure: OPEN REDUCTION INTERNAL FIXATION (ORIF) left distal radius fracture;  Surgeon: Renette Butters, MD;  Location: [facility];  Service: Orthopedics;  Laterality: Left;  with block  . PORT-A-CATH REMOVAL Left 10/10/2015   Procedure: PORT REMOVAL;  Surgeon: Stark Klein, MD;  Location: [place];  Service: General;  Laterality: Left;  . PORTACATH PLACEMENT Left 08/02/2014   Procedure: INSERTION PORT-A-CATH;  Surgeon: Stark Klein, MD;  Location: [place];  Service: General;  Laterality: Left;  . PORTACATH PLACEMENT N/A 11/05/2016   Procedure: INSERTION PORT-A-CATH;  Surgeon: Stark Klein, MD;  Location: [place];  Service: General;  Laterality: N/A;  . RETINAL LASER PROCEDURE     retina tear on left eye  . ROTATOR CUFF REPAIR Right 2005  . SCAR REVISION Right 12/08/2015   Procedure: COMPLEX REPAIR RIGHT CHEST 10-15CM;  Surgeon: [name], MD;  Location: [place];  Service: Plastics;  Laterality: Right;  . TOE SURGERY  1970s   bone spur  . TRIGGER FINGER RELEASE     x3    FAMILY HISTORY Family History  Problem Relation Age of Onset  . CVA Mother   . Heart disease Mother   . Sudden death Mother   . Diabetes Father   . Heart failure Father   . Hypertension  Father   . Kidney disease Father   . Obesity Father   . Hypertension Sister   . Diabetes Brother   . Breast cancer Paternal Grandmother 82  . Diabetes Paternal Uncle   . Ovarian cancer Neg Hx   The patient's father died from complications of diabetes at age 20. The patient's mother died at age 47 with a subarachnoid hemorrhage. The patient had one brother, one sister. The only breast cancer was the patient's paternal grandmother diagnosed in her 46s. There is no history of ovarian cancer in the family.   GYNECOLOGIC HISTORY:  Patient's last menstrual period was 10/22/2007.  menarche age 68, the patient is GX P0. She went through the change of life in 2011. She did not take hormone replacement.   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 Her husband [name] is a retired Visual merchandiser (he taught at [employer]). He was diagnosed with prostate cancer in 2021. In May 2020 they moved to [facility] in the [place].  [name] has 2 children from an earlier marriage, Rockey Situ who teaches in [place], and Lempi Edwin,  who works at the [employer] in in [place]. He has 1 grand son, aged 46. The patient and her husband are not church attenders.   ADVANCED DIRECTIVES:  Not in place   HEALTH MAINTENANCE: Social History   Tobacco Use  . Smoking status: Never Smoker  . Smokeless tobacco: Never Used  Vaping Use  . Vaping Use: Never used  Substance Use Topics  . Alcohol use: Yes    Alcohol/week: 1.0 - 2.0 standard drink    Types: 1 - 2 Standard drinks or equivalent per week    Comment: social  . Drug use: No     Colonoscopy:  May 2016  PAP:  Bone density: 07/01/2016 at [facility] showed normal, with a T score of -0.7  Lipid panel:  Allergies  Allergen Reactions  . Nsaids Other (See Comments)    H/o gastric bypass -  avoid NSAIDs  . Tape Hives, Rash and Other (See Comments)    Adhesives in bandaids    Current Outpatient Medications  Medication Sig Dispense Refill  . Accu-Chek Softclix Lancets lancets 1 each by Other route 4 (four) times daily. Test blood sugars 4 x times daily 100 each 1  . aspirin EC 81 MG tablet Take 1 tablet (81 mg total) by mouth daily. 90 tablet 3  . Bempedoic Acid-Ezetimibe (NEXLIZET) 180-10 MG TABS  Take 1 tablet by mouth daily. 90 tablet 3  . Blood Glucose Calibration (ACCU-CHEK GUIDE CONTROL) LIQD 1 Bottle by In Vitro route as needed. 1 each 0  . Blood Glucose Monitoring Suppl (ACCU-CHEK GUIDE) w/Device KIT 1 each by Does not apply route 4 (four) times daily. Use to test blood sugars 4x a day 1 kit 0  . buPROPion (WELLBUTRIN SR) 200 MG 12 hr tablet Take one tablet by mouth once daily. 90 tablet 0  . CALCIUM CITRATE-VITAMIN D PO Take 1 tablet by mouth 3 (three) times daily.     . carvedilol (COREG) 3.125 MG tablet TAKE 1 TABLET BY MOUTH TWICE DAILY. 180 tablet 3  . cholecalciferol (VITAMIN D) 1000 units tablet Take 1,000 Units by mouth daily.    . cholestyramine (QUESTRAN) 4 g packet Take 1 packet (4 g total) by mouth 2 (two) times daily as needed. 10 each 12  . Cyanocobalamin (VITAMIN B-12 PO) Take 1 tablet by mouth every 14 (fourteen) days.    . diphenhydrAMINE (BENADRYL) 25 MG tablet Take 25 mg by mouth daily as needed for allergies or sleep.     [name] glucose blood (ACCU-CHEK GUIDE) test strip CHECK BLOOD SUGAR 4 TIMES A DAY. 400 each 0  . insulin glargine (LANTUS SOLOSTAR) 100 UNIT/ML Solostar Pen Inject 10 Units into the skin daily. 15 mL 0  . Insulin Pen Needle (SURE COMFORT PEN NEEDLES) 32G X 4 MM MISC Use as instructed to inject insulin and Victoza daily. 200 each 0  . liraglutide (VICTOZA) 18 MG/3ML SOPN Pt to take 1.8 mg qam 27 mL 0  . loratadine (CLARITIN) 10 MG tablet Take 10 mg by mouth as needed for allergies.    [name] losartan (COZAAR) 100 MG tablet Take 1 tablet (100 mg  total) by mouth daily. 90 tablet 0  . ondansetron (ZOFRAN) 4 MG tablet Take 1 tablet (4 mg total) by mouth every 8 (eight) hours as needed for nausea or vomiting. 20 tablet 0  . Pediatric Multivitamins-Iron (FLINTSTONES PLUS IRON) chewable tablet Chew 1 tablet by mouth 2 (two) times daily.    . Probiotic Product (CVS PROBIOTIC PO) Take 1 capsule by mouth daily.     . prochlorperazine (COMPAZINE) 10 MG tablet Take 1 tablet (10 mg total) by mouth every 6 (six) hours as needed for nausea or vomiting. 30 tablet 0  . Propylene Glycol (SYSTANE BALANCE OP) Place 1-2 drops into both eyes 3 (three) times daily as needed (for dry eyes).     [name] senna-docusate (SENOKOT-S) 8.6-50 MG tablet Take 1 tablet by mouth daily as needed for moderate constipation.     . topiramate (TOPAMAX) 25 MG tablet Take 1 tablet (25 mg total) by mouth daily. 90 tablet 0  . vortioxetine HBr (TRINTELLIX) 20 MG TABS tablet Take 1 tablet (20 mg total) by mouth at bedtime. 90 tablet 0   No current facility-administered medications for this visit.   Facility-Administered Medications Ordered in Other Visits  Medication Dose Route Frequency Provider Last Rate Last Admin  . sodium chloride flush (NS) 0.9 % injection 10 mL  10 mL Intracatheter PRN Vivianna Piccini, Virgie Dad, MD   10 mL at 02/16/20 1712    OBJECTIVE: White woman in no acute distress  Vitals:   02/16/20 1351  BP: 139/74  Pulse: 67  Resp: 18  Temp: 97.9 F (36.6 C)  SpO2: 100%     Body mass index is 26.32 kg/m.     ECOG FS:1 - Symptomatic but completely  ambulatory   Filed Weights   02/16/20 1351  Weight: 178 lb 3.2 oz (80.8 kg)     Sclerae unicteric, EOMs intact Wearing a mask No cervical or supraclavicular adenopathy Lungs no rales or rhonchi Heart regular rate and rhythm Abd soft, nontender, positive bowel sounds MSK 5 x 5 cm hematoma in the left hip area, with significant associated ecchymosis.  According to the patient this is already much smaller and softer  than prior.  The fall occurred approximately 14 days prior Neuro: nonfocal, well oriented, positive affect Breasts: Status post right mastectomy with no evidence of local recurrence.  Left breast and both axillae are benign.   LAB RESULTS:  CMP     Component Value Date/Time   NA 142 02/16/2020 1327   NA 145 (H) 01/04/2020 0951   NA 140 01/07/2017 0758   K 4.1 02/16/2020 1327   K 4.1 01/07/2017 0758   CL 112 (H) 02/16/2020 1327   CO2 24 02/16/2020 1327   CO2 24 01/07/2017 0758   GLUCOSE 79 02/16/2020 1327   GLUCOSE 134 01/07/2017 0758   BUN 37 (H) 02/16/2020 1327   BUN 33 (H) 01/04/2020 0951   BUN 33.6 (H) 01/07/2017 0758   CREATININE 1.85 (H) 02/16/2020 1327   CREATININE 1.62 (H) 04/28/2018 0915   CREATININE 1.5 (H) 01/07/2017 0758   CALCIUM 9.1 02/16/2020 1327   CALCIUM 9.3 01/07/2017 0758   PROT 6.4 (L) 02/16/2020 1327   PROT 6.3 01/04/2020 0951   PROT 6.7 01/07/2017 0758   ALBUMIN 3.9 02/16/2020 1327   ALBUMIN 4.3 01/04/2020 0951   ALBUMIN 3.6 01/07/2017 0758   AST 23 02/16/2020 1327   AST 43 (H) 04/28/2018 0915   AST 51 (H) 01/07/2017 0758   ALT 11 02/16/2020 1327   ALT 61 (H) 04/28/2018 0915   ALT 82 (H) 01/07/2017 0758   ALKPHOS 49 02/16/2020 1327   ALKPHOS 115 01/07/2017 0758   BILITOT 0.5 02/16/2020 1327   BILITOT 0.4 01/04/2020 0951   BILITOT 0.6 04/28/2018 0915   BILITOT 0.72 01/07/2017 0758   GFRNONAA 30 (L) 02/16/2020 1327   GFRNONAA 33 (L) 04/28/2018 0915   GFRNONAA 55 (L) 02/18/2013 0827   GFRAA 36 (L) 01/04/2020 0951   GFRAA 38 (L) 04/28/2018 0915   GFRAA 63 02/18/2013 0827    INo results found for: SPEP, UPEP  Lab Results  Component Value Date   WBC 4.7 02/16/2020   NEUTROABS 3.0 02/16/2020   HGB 10.0 (L) 02/16/2020   HCT 30.2 (L) 02/16/2020   MCV 95.6 02/16/2020   PLT 179 02/16/2020      Chemistry      Component Value Date/Time   NA 142 02/16/2020 1327   NA 145 (H) 01/04/2020 0951   NA 140 01/07/2017 0758   K 4.1 02/16/2020 1327    K 4.1 01/07/2017 0758   CL 112 (H) 02/16/2020 1327   CO2 24 02/16/2020 1327   CO2 24 01/07/2017 0758   BUN 37 (H) 02/16/2020 1327   BUN 33 (H) 01/04/2020 0951   BUN 33.6 (H) 01/07/2017 0758   CREATININE 1.85 (H) 02/16/2020 1327   CREATININE 1.62 (H) 04/28/2018 0915   CREATININE 1.5 (H) 01/07/2017 0758      Component Value Date/Time   CALCIUM 9.1 02/16/2020 1327   CALCIUM 9.3 01/07/2017 0758   ALKPHOS 49 02/16/2020 1327   ALKPHOS 115 01/07/2017 0758   AST 23 02/16/2020 1327   AST 43 (H) 04/28/2018 0915   AST 51 (H)  01/07/2017 0758   ALT 11 02/16/2020 1327   ALT 61 (H) 04/28/2018 0915   ALT 82 (H) 01/07/2017 0758   BILITOT 0.5 02/16/2020 1327   BILITOT 0.4 01/04/2020 0951   BILITOT 0.6 04/28/2018 0915   BILITOT 0.72 01/07/2017 0758       No results found for: LABCA2  No components found for: [id number]  No results for input(s): INR in the last 168 hours.  Urinalysis    Component Value Date/Time   COLORURINE YELLOW 07/21/2015 [place] 07/21/2015 2328   LABSPEC 1.020 07/21/2015 2328   PHURINE 6.0 07/21/2015 2328   GLUCOSEU NEGATIVE 07/21/2015 2328   HGBUR TRACE (A) 07/21/2015 2328   BILIRUBINUR NEGATIVE 07/21/2015 2328   BILIRUBINUR neg 06/29/2013 [place] 07/21/2015 2328   PROTEINUR 30 (A) 07/21/2015 2328   UROBILINOGEN negative 06/29/2013 1017   NITRITE NEGATIVE 07/21/2015 2328   LEUKOCYTESUR NEGATIVE 07/21/2015 2328    STUDIES: US Abdomen Complete  Result Date: 01/19/2020 CLINICAL DATA:  Metastatic breast cancer to the liver EXAM: ABDOMEN ULTRASOUND COMPLETE COMPARISON:  MRI abdomen from 09/20/2019. FINDINGS: Gallbladder: Gallstones are present in the gallbladder measuring up to about 0.6 cm in diameter. No gallbladder wall thickening or pericholecystic fluid. No sonographic Murphy sign noted by sonographer. Common bile duct: Diameter: 0.3 cm Liver: The subtle 0.6 cm T2 hyperintense lesion in the dome of the liver shown on  prior MRI is not readily apparent on ultrasound. This does not necessarily imply that this lesion has totally resolved given the greater sensitivity of MRI compared to ultrasound in depicting lesions of this size and location. The liver is within normal limits in parenchymal echogenicity. Portal vein is patent on color Doppler imaging with normal direction of blood flow towards the liver. IVC: No abnormality visualized. Pancreas: Not well seen due to overlying bowel gas. Spleen: Size and appearance within normal limits. Right Kidney: Length: 10.9 cm. Echogenicity within normal limits. No mass or hydronephrosis visualized. Left Kidney: Length: 11.1 cm. Echogenicity within normal limits. No mass or hydronephrosis visualized. Abdominal aorta: No aneurysm visualized. Partial obscuration due to overlying bowel gas. Other findings: None. IMPRESSION: 1. The subtle 6 mm T2 hyperintense lesion in the dome of the liver on prior MRI representing residua from prior metastatic disease is not well seen on today's ultrasound. This may indicate resolution or simply reduced sensitivity of ultrasound in detecting lesions of this size and subtlety. No new lesion is sonographically apparent in the liver. Electronically Signed   By: Van Clines M.D.   On: 01/19/2020 15:50     ASSESSMENT: 70 y.o.  woman status post right breast upper outer quadrant and right axillary lymph node biopsy 07/19/2014 for a cT2  pN1, stage IIB  invasive ductal carcinoma, grade 2, estrogen and progesterone receptor negative, with an MIB-1 of 90%, and HER-2 amplified  (1) neoadjuvant chemotherapy started 08/08/2014, consisting of carboplatin, docetaxel, trastuzumab and pertuzumab given every 3 weeks 6, completed 11/21/2014  (a) breast MRI after initial 3 cycles shows a significant response  (b) breast MRI after 6 cycles shows a complete radiologic response   (2) trastuzumab continued through 10/02/2015 (to end of capecitabine  therapy)  (a) echo 02/13/2015 shows an EF of 60-65%  (b)  echo 05/09/2015 shows an ejection fraction of 60-65%  (c) echocardiogram 06/27/2015 shows an ejection fraction of 55-60%.  (d) echocardiogram 01/22/2017 shows an ejection fraction of 55-60%  (3) right lumpectomy with sentinel lymph node biopsy on 12/27/14 showed  a residual  ypT2 ypN1a, stage IIB invasive ductal carcinoma, grade 2, with repeat estrogen and progesterone receptors again negative  (4) status post right oncoplastic surgery (with left reduction mammoplasty) 01/06/2015 showing in the right breast multiple foci of intralymphatic carcinoma in random sections  (5)  Right modified radical mastectomy 02/15/2014 showed no residual invasive disease; there was DCIS but margins were negative; all 15 axillary lymph nodes were clear; ypTis ypN0  (a)  The patient has decided against reconstruction.  (6)  Postmastectomy radiation completed 05/18/2015 with capecitabine sensitization  (7) adjuvant capecitabine continued through 09/27/2015  (8) hepatic steatosis with increased fibrosis score (at risk for cirrhosis)  (9) thyroid biopsy (left lobe inferiorly) 2 shows benign tissue 06/28/2015  (10) LOCAL RECURRENCE:  (a) PET scan 12/06/2015 Notes a right retropectoral lymph node measuring 0.9 cm with an SUV max of 5.5  (b) repeat PET scan 03/11/2016 finds the right subpectoral lymph node to now measure 1.3 cm with an SUV max of 11.8.  (c) PET scan 05/07/2016 shows a significant response to T-DM 1  (d) PET scan 07/10/2017 is clear except for what appears to be a single new liver lesion   (11) started T-DM1 03/21/2016, completed 8 doses 08/13/2016  (a) PET scan 08/19/2016 shows a good response but measurable residual disease  (b) PET scan on 01/16/2017 shows a continuing response  (12) maintenance trastuzumab started 09/03/2016--changed to every 28 days as of 01/28/2017  (a) repeat echocardiogram 07/08/2016 shows an ejection fraction of  60-65%.  (b) echocardiogram on 10/08/2016 demonstrates LVEF of 55-60% (followed by Dr. Haroldine Laws)  (c) echo 01/22/2017 shows an ejection fraction in the 55-60% range  (d) echo 04/24/2017 shows an ejection fraction in the 55-60% range (recommendation for echo every 6 months by Dr. Aundra Dubin)  (e) trastuzumab changed to T-DM 1 after trastuzumab dose 07/15/2017  METASTATIC DISEASE: July 2019 (13)  Radiation to the area of persistent disease in the right upper chest completed 10/30/2016   (14) T-DM 1 resumed 08/12/2017, repeated every 21 days  (a) baseline liver MRI 08/02/2017 shows a 1.4 cm mass, "hot" on PET 07/10/2017  (b) repeat liver MRI 11/18/2017 after 3 cycles of T-DM 1, shows slight enlargement  (c) T-DM 1 discontinued after 09/23/2017 dose  (15) trastuzumab resumed 11/18/2017, to be repeated every 4 weeks,   (a) changed back to every 3 weeks in 03/2018  (b) echo 09/18/2018 shows an ejection fraction in the 50-55% range.  (c) pertuzumab added with 10/06/2018 dose  (d) trastuzumab/pertuzumab changed to every 4 weeks after 01/19/2019 dose  (e) liver MRI 04/27/2019 shows mild sclerosis, no change in right hepatic lobe treated metastases, with no new lesions; prominent porta hepatis nodes were felt likely reactive  (f) abdominal ultrasound 06/28/2027 shows no focal liver lesions, no ascites  (g) liver MRI 09/20/2019 shows stable disease (0.6 cm).  (16) liver biopsy 11/03/2017 shows adenocarcinoma, most consistent with an upper gastrointestinal primary; this tumor was estrogen receptor and HER-2 negative  (a) GI workup for occult primary ([place]) negative  (b) PET 12/09/2017 shows no obvious primary.  There is a right subpectoral lymph node noted as well  (c) CA 27-29 and CA 19-9 uninformative; CEA 6.72 OCT 2019  (d) negative hepatitis B serologies May 2021    (17) started Abraxane on 12/111/2019, given days 1 and 8 on a 21 day cycle   (a) restaging with MRI liver 02/24/2018 shows a gratifying  initial response  (b) PET scan 05/14/2018 shows resolution of the measurable  disease in the liver  (c) liver MRI 09/14/2018 shows no change compared to prior PET and MRI.  (d) Abraxane discontinued after 09/01/2018 dose  (18) continues on Pertuzumab and trastuzumab every 4 weeks (see #15 above)    PLAN: Darlene [name] is now 2-1/2 years out from definitive diagnosis of metastatic breast cancer.  She has no symptoms related to her disease.  She is tolerating the treatment remarkably well.  The plan is to continue the trastuzumab and Pertuzumab every 4 weeks indefinitely until there is evidence of disease progression.  She will see me with her a port treatment and shortly before that she will have a repeat MRI of the liver.  Total encounter time 25 minutes.Sarajane Jews C. Lindsay Soulliere, MD 02/16/20 5:34 PM Medical Oncology and Hematology [facility] [place], [place] [address] Tel. [phone]    Fax. [phone]   I, Wilburn Mylar, am acting as scribe for Dr. Virgie Dad. Marely Apgar.  I, Lurline Del MD, have reviewed the above documentation for accuracy and completeness, and I agree with the above.   *Total Encounter Time as defined by the Centers for Medicare and Medicaid Services includes, in addition to the face-to-face time of a patient visit (documented in the note above) non-face-to-face time: obtaining and reviewing outside history, ordering and reviewing medications, tests or procedures, care coordination (communications with other health care professionals or caregivers) and documentation in the medical record.

## 2020-02-16 NOTE — Progress Notes (Signed)
OK to treat with creat 1.85 per Dr Jana Hakim.

## 2020-02-16 NOTE — Patient Instructions (Signed)

## 2020-02-18 ENCOUNTER — Telehealth: Payer: Self-pay | Admitting: Oncology

## 2020-02-18 NOTE — Telephone Encounter (Signed)
Scheduled per 1/26 los. Called and spoke with pt, confirmed 2/23 appts

## 2020-02-20 ENCOUNTER — Other Ambulatory Visit: Payer: Self-pay

## 2020-02-20 ENCOUNTER — Other Ambulatory Visit (HOSPITAL_COMMUNITY): Payer: Self-pay | Admitting: Cardiology

## 2020-02-20 ENCOUNTER — Ambulatory Visit
Admission: RE | Admit: 2020-02-20 | Discharge: 2020-02-20 | Disposition: A | Discharge status: Home / Self Care | Payer: Medicare PPO | Source: Ambulatory Visit | Attending: Neurology | Admitting: Neurology

## 2020-02-20 DIAGNOSIS — R252 Cramp and spasm: Secondary | ICD-10-CM

## 2020-02-20 DIAGNOSIS — Z853 Personal history of malignant neoplasm of breast: Secondary | ICD-10-CM | POA: Diagnosis not present

## 2020-02-20 DIAGNOSIS — M21371 Foot drop, right foot: Secondary | ICD-10-CM

## 2020-02-20 DIAGNOSIS — M5126 Other intervertebral disc displacement, lumbar region: Secondary | ICD-10-CM | POA: Diagnosis not present

## 2020-02-20 DIAGNOSIS — R200 Anesthesia of skin: Secondary | ICD-10-CM | POA: Diagnosis not present

## 2020-02-20 DIAGNOSIS — M48061 Spinal stenosis, lumbar region without neurogenic claudication: Secondary | ICD-10-CM | POA: Diagnosis not present

## 2020-02-20 DIAGNOSIS — M21372 Foot drop, left foot: Secondary | ICD-10-CM

## 2020-02-20 DIAGNOSIS — G589 Mononeuropathy, unspecified: Secondary | ICD-10-CM

## 2020-02-20 MED ORDER — GADOBENATE DIMEGLUMINE 529 MG/ML IV SOLN
16.0000 mL | Freq: Once | INTRAVENOUS | Status: AC | PRN
  Administered 2020-02-20: 16 mL via INTRAVENOUS

## 2020-02-21 ENCOUNTER — Telehealth: Payer: Self-pay

## 2020-02-21 DIAGNOSIS — M21371 Foot drop, right foot: Secondary | ICD-10-CM

## 2020-02-21 DIAGNOSIS — M21372 Foot drop, left foot: Secondary | ICD-10-CM

## 2020-02-21 NOTE — Telephone Encounter (Signed)
-----   Message from Darlene Berthold, DO sent at 02/21/2020 10:30 AM EST ----- Please inform patient that her MRI does show some nerve impingement in her back, which like contributing to her foot weakness.  I would recommend starting physical therapy for low back strengthening and gait training.  Please have her schedule follow-up in 3 months in the office. Thanks.

## 2020-02-21 NOTE — Telephone Encounter (Signed)
Called patient and informed her of MRI results and recommendations. Patient would like a PT referral sent to [facility]. Informed patient that Dr. Posey Pronto would like her to f/u in 3 months. Patient was transferred up front to schedule f/u.  Referral sent to [facility].

## 2020-03-01 DIAGNOSIS — H43811 Vitreous degeneration, right eye: Secondary | ICD-10-CM | POA: Diagnosis not present

## 2020-03-01 DIAGNOSIS — H31092 Other chorioretinal scars, left eye: Secondary | ICD-10-CM | POA: Diagnosis not present

## 2020-03-01 DIAGNOSIS — E113513 Type 2 diabetes mellitus with proliferative diabetic retinopathy with macular edema, bilateral: Secondary | ICD-10-CM | POA: Diagnosis not present

## 2020-03-01 DIAGNOSIS — H35371 Puckering of macula, right eye: Secondary | ICD-10-CM | POA: Diagnosis not present

## 2020-03-02 DIAGNOSIS — M62562 Muscle wasting and atrophy, not elsewhere classified, left lower leg: Secondary | ICD-10-CM | POA: Diagnosis not present

## 2020-03-02 DIAGNOSIS — R269 Unspecified abnormalities of gait and mobility: Secondary | ICD-10-CM | POA: Diagnosis not present

## 2020-03-02 DIAGNOSIS — R296 Repeated falls: Secondary | ICD-10-CM | POA: Diagnosis not present

## 2020-03-02 DIAGNOSIS — M62561 Muscle wasting and atrophy, not elsewhere classified, right lower leg: Secondary | ICD-10-CM | POA: Diagnosis not present

## 2020-03-03 DIAGNOSIS — R269 Unspecified abnormalities of gait and mobility: Secondary | ICD-10-CM | POA: Diagnosis not present

## 2020-03-03 DIAGNOSIS — M62561 Muscle wasting and atrophy, not elsewhere classified, right lower leg: Secondary | ICD-10-CM | POA: Diagnosis not present

## 2020-03-03 DIAGNOSIS — M62562 Muscle wasting and atrophy, not elsewhere classified, left lower leg: Secondary | ICD-10-CM | POA: Diagnosis not present

## 2020-03-03 DIAGNOSIS — R296 Repeated falls: Secondary | ICD-10-CM | POA: Diagnosis not present

## 2020-03-07 DIAGNOSIS — M62562 Muscle wasting and atrophy, not elsewhere classified, left lower leg: Secondary | ICD-10-CM | POA: Diagnosis not present

## 2020-03-07 DIAGNOSIS — R269 Unspecified abnormalities of gait and mobility: Secondary | ICD-10-CM | POA: Diagnosis not present

## 2020-03-07 DIAGNOSIS — M62561 Muscle wasting and atrophy, not elsewhere classified, right lower leg: Secondary | ICD-10-CM | POA: Diagnosis not present

## 2020-03-07 DIAGNOSIS — R296 Repeated falls: Secondary | ICD-10-CM | POA: Diagnosis not present

## 2020-03-08 DIAGNOSIS — M62562 Muscle wasting and atrophy, not elsewhere classified, left lower leg: Secondary | ICD-10-CM | POA: Diagnosis not present

## 2020-03-08 DIAGNOSIS — R296 Repeated falls: Secondary | ICD-10-CM | POA: Diagnosis not present

## 2020-03-08 DIAGNOSIS — M62561 Muscle wasting and atrophy, not elsewhere classified, right lower leg: Secondary | ICD-10-CM | POA: Diagnosis not present

## 2020-03-08 DIAGNOSIS — R269 Unspecified abnormalities of gait and mobility: Secondary | ICD-10-CM | POA: Diagnosis not present

## 2020-03-09 ENCOUNTER — Ambulatory Visit (HOSPITAL_BASED_OUTPATIENT_CLINIC_OR_DEPARTMENT_OTHER)
Admission: RE | Admit: 2020-03-09 | Discharge: 2020-03-09 | Disposition: A | Discharge status: Home / Self Care | Payer: Medicare PPO | Source: Ambulatory Visit | Attending: Cardiology | Admitting: Cardiology

## 2020-03-09 ENCOUNTER — Ambulatory Visit (HOSPITAL_COMMUNITY)
Admission: RE | Admit: 2020-03-09 | Discharge: 2020-03-09 | Disposition: A | Discharge status: Home / Self Care | Payer: Medicare PPO | Source: Ambulatory Visit | Attending: Cardiology | Admitting: Cardiology

## 2020-03-09 ENCOUNTER — Other Ambulatory Visit: Payer: Self-pay

## 2020-03-09 ENCOUNTER — Encounter (HOSPITAL_COMMUNITY): Payer: Self-pay | Admitting: Cardiology

## 2020-03-09 ENCOUNTER — Encounter (INDEPENDENT_AMBULATORY_CARE_PROVIDER_SITE_OTHER): Payer: Self-pay | Admitting: Family Medicine

## 2020-03-09 VITALS — BP 150/70 | HR 61 | Wt 179.6 lb

## 2020-03-09 DIAGNOSIS — Z5181 Encounter for therapeutic drug level monitoring: Secondary | ICD-10-CM | POA: Insufficient documentation

## 2020-03-09 DIAGNOSIS — Z886 Allergy status to analgesic agent status: Secondary | ICD-10-CM | POA: Insufficient documentation

## 2020-03-09 DIAGNOSIS — Z794 Long term (current) use of insulin: Secondary | ICD-10-CM | POA: Insufficient documentation

## 2020-03-09 DIAGNOSIS — Z888 Allergy status to other drugs, medicaments and biological substances status: Secondary | ICD-10-CM | POA: Insufficient documentation

## 2020-03-09 DIAGNOSIS — Z0189 Encounter for other specified special examinations: Secondary | ICD-10-CM | POA: Diagnosis not present

## 2020-03-09 DIAGNOSIS — G4733 Obstructive sleep apnea (adult) (pediatric): Secondary | ICD-10-CM | POA: Diagnosis not present

## 2020-03-09 DIAGNOSIS — Z9011 Acquired absence of right breast and nipple: Secondary | ICD-10-CM | POA: Diagnosis not present

## 2020-03-09 DIAGNOSIS — Z7984 Long term (current) use of oral hypoglycemic drugs: Secondary | ICD-10-CM | POA: Diagnosis not present

## 2020-03-09 DIAGNOSIS — E1121 Type 2 diabetes mellitus with diabetic nephropathy: Secondary | ICD-10-CM | POA: Diagnosis not present

## 2020-03-09 DIAGNOSIS — C50911 Malignant neoplasm of unspecified site of right female breast: Secondary | ICD-10-CM | POA: Diagnosis not present

## 2020-03-09 DIAGNOSIS — Z9884 Bariatric surgery status: Secondary | ICD-10-CM | POA: Insufficient documentation

## 2020-03-09 DIAGNOSIS — I509 Heart failure, unspecified: Secondary | ICD-10-CM | POA: Insufficient documentation

## 2020-03-09 DIAGNOSIS — C50411 Malignant neoplasm of upper-outer quadrant of right female breast: Secondary | ICD-10-CM | POA: Diagnosis not present

## 2020-03-09 DIAGNOSIS — Z923 Personal history of irradiation: Secondary | ICD-10-CM | POA: Diagnosis not present

## 2020-03-09 DIAGNOSIS — Z803 Family history of malignant neoplasm of breast: Secondary | ICD-10-CM | POA: Insufficient documentation

## 2020-03-09 DIAGNOSIS — N183 Chronic kidney disease, stage 3 unspecified: Secondary | ICD-10-CM | POA: Diagnosis not present

## 2020-03-09 DIAGNOSIS — E1122 Type 2 diabetes mellitus with diabetic chronic kidney disease: Secondary | ICD-10-CM | POA: Insufficient documentation

## 2020-03-09 DIAGNOSIS — Z7982 Long term (current) use of aspirin: Secondary | ICD-10-CM | POA: Insufficient documentation

## 2020-03-09 DIAGNOSIS — I130 Hypertensive heart and chronic kidney disease with heart failure and stage 1 through stage 4 chronic kidney disease, or unspecified chronic kidney disease: Secondary | ICD-10-CM | POA: Insufficient documentation

## 2020-03-09 DIAGNOSIS — Z171 Estrogen receptor negative status [ER-]: Secondary | ICD-10-CM | POA: Diagnosis not present

## 2020-03-09 DIAGNOSIS — I2510 Atherosclerotic heart disease of native coronary artery without angina pectoris: Secondary | ICD-10-CM | POA: Insufficient documentation

## 2020-03-09 DIAGNOSIS — Z79899 Other long term (current) drug therapy: Secondary | ICD-10-CM | POA: Insufficient documentation

## 2020-03-09 DIAGNOSIS — E1142 Type 2 diabetes mellitus with diabetic polyneuropathy: Secondary | ICD-10-CM | POA: Insufficient documentation

## 2020-03-09 DIAGNOSIS — Z8249 Family history of ischemic heart disease and other diseases of the circulatory system: Secondary | ICD-10-CM | POA: Diagnosis not present

## 2020-03-09 HISTORY — DX: Heart failure, unspecified: I50.9

## 2020-03-09 LAB — ECHOCARDIOGRAM COMPLETE
Area-P 1/2: 2.95 cm2
S' Lateral: 2.9 cm

## 2020-03-09 MED ORDER — DAPAGLIFLOZIN PROPANEDIOL 10 MG PO TABS: 10.0000 mg | ORAL_TABLET | Freq: Every day | ORAL | 11 refills | Status: DC

## 2020-03-09 MED ORDER — LANTUS SOLOSTAR 100 UNIT/ML ~~LOC~~ SOPN: 8.0000 [IU] | PEN_INJECTOR | Freq: Every day | SUBCUTANEOUS | 3 refills | Status: DC

## 2020-03-09 NOTE — Progress Notes (Signed)
  Echocardiogram 2D Echocardiogram has been performed.  Darlene [name] 03/09/2020, 9:54 AM

## 2020-03-09 NOTE — Patient Instructions (Addendum)
Labs done today. We will contact you only if your labs are abnormal.  START Wilder Glade 10mg  (1 tablet) daily.  INCREASE Carvedilol to 6.25mg  (1 tablet) by mouth 2 times daily.  DECREASE Lantus to 8u daily.  No other medication changes were made. Please continue all current medications as prescribed.  Your physician recommends that you schedule a follow-up appointment in: 2 weeks for a lab only appointment and in 6 months for an appointment with Dr. Aundra Dubin with an echo prior to your exam.  Your physician has requested that you have an echocardiogram. Echocardiography is a painless test that uses sound waves to create images of your heart. It provides your doctor with information about the size and shape of your heart and how well your heart's chambers and valves are working. This procedure takes approximately one hour. There are no restrictions for this procedure.   If you have any questions or concerns before your next appointment please send [name] a message through [place] or call our office at [phone].    TO LEAVE A MESSAGE FOR THE NURSE SELECT OPTION 2, PLEASE LEAVE A MESSAGE INCLUDING: . YOUR NAME . DATE OF BIRTH . CALL BACK NUMBER . REASON FOR CALL**this is important as we prioritize the call backs  YOU WILL RECEIVE A CALL BACK THE SAME DAY AS LONG AS YOU CALL BEFORE 4:00 PM   Do the following things EVERYDAY: 1) Weigh yourself in the morning before breakfast. Write it down and keep it in a log. 2) Take your medicines as prescribed 3) Eat low salt foods--Limit salt (sodium) to 2000 mg per day.  4) Stay as active as you can everyday 5) Limit all fluids for the day to less than 2 liters   At the [facility], you and your health needs are our priority. As part of our continuing mission to provide you with exceptional heart care, we have created designated Provider Care Teams. These Care Teams include your primary Cardiologist (physician) and Advanced Practice  Providers (APPs- Physician Assistants and Nurse Practitioners) who all work together to provide you with the care you need, when you need it.   You may see any of the following providers on your designated Care Team at your next follow up: [name] Dr Glori Bickers . Dr Loralie Champagne . Darrick Grinder, NP . Lyda Jester, PA . Audry Riles, PharmD   Please be sure to bring in all your medications bottles to every appointment.

## 2020-03-09 NOTE — Progress Notes (Signed)
Patient ID: Darlene [name], female   DOB: 1950-11-17, 70 y.o.   MRN: [id number] Oncologist: Dr Jana Hakim   HPI: Darlene [name] is a 70 y.o.with history of R breast cancer, right axillary lymph node biopsy 07/19/2014 for a cT2 pN1, stage IIB invasive ductal carcinoma, grade 2, estrogen and progesterone receptor negative, with an MIB-1 of 90%, and Darlene [name]-2 positive , OSA -> CPAP, bariatric surgery, and DM, referred to cardio-oncology clinic by Dr Jana Hakim  Completed neoadjuvant chemotherapy with carboplatin, docetaxel, trastuzumab and pertuzumab given every 3 weeks 6. Trastuzumab + capecitabine were completed in 9/17.  Darlene [name] had lumpectomy 12/16 and had mastectomy in 1/17.  Darlene [name] has completed radiation.   Darlene [name] was found in 11/17 to have a recurrence by PET => right retropectoral lymph node enlarged. In 2/18, Darlene [name] started on treatment with ado-trastuzumab emtansine, now back on Herceptin.  Echo prior to treatment showed stable EF.  Starting in 6/19, Darlene [name] was transitioned to [facility].   Darlene [name] is now off Kadcyla and back on Herceptin/Perjeta for use long-term.     Echo in 2/21 showed EF 55-60% with more negative strain.   Coronary artery calcium score was done in 2/21, this showed CAC 3653 Agatston units, placing Darlene [name] in the 99th percentile. Darlene [name] was started on a Crestor but LFTs rose.  Darlene [name] had RUQ [name] without significant abnormality.  Hepatitis labs were negative.  The statin was stopped.  Cardiolite in 3/21 showed EF 51%, no ischemia/infarction.  Darlene [name] ended up on bempedoic acid.   Echo in 6/21 showed EF 55-60% with GLS -17.6%.  Echo today showed EF 55-60%, GLS -15.8%, normal RV.  Echo was done today and reviewed, EF 55-60% with GLS -27.5%.   Darlene [name] comes in today for followup of CAD and chemotherapy with Herceptin.  No chest pain, no exertional dyspnea.  Darlene [name] has significant diabetic peripheral neuropathy with balance difficulty, Darlene [name] has fallen.  No lightheadedness. BP is elevated today.  Creatinine has been stably  elevated.   Labs (1/21): LDL 75 Labs (4/21): LDL 42, AST 70 => 125, ALT 117 => 255 Labs (10/21): LDL 18, HDL 58,  K 3.8, creatinine 1.88 Labs (1/22): K 4.1, creatinine 1.85  Echo (7/16) with EF 60-65%, grade II diastolic dysfunction.  Echo (10/16) with EF 66%, GLS -17%. Echo (1/17) with EF 60-65%, grade II diastolic dysfunction, lateral s' 10, GLS -18.8% Echo (4/17) with EF 60-65%, grade II diastolic dysfunction, lateral s' 9.5, strain poorly traced and off axis, normal RV size and systolic function.  Echo (6/17) with EF 55-60%, aortic sclerosis without stenosis, difficult images for strain, probably not accurate.  Echo (10/17) with EF 55-60%, mild LVH, moderate diastolic dysfunction, normal RV size and systolic function.  Echo (2/18) with EF 55-60%, GLS -15% but technically difficult study Echo (6/18) with EF 60-65%, GLS -15% but technically difficult study.  Echo (9/18) with EF 55-60%, GLS -19.8%, grade II diastolic dysfunction.  Echo (1/19) with EF 55-60%, normal RV size and systolic function, GLS -23.7% (done with different machine).  Echo (4/19) with EF 55-60%, mild LVH, GLS -17% Echo (9/19) with EF 55-60%, GLS -17.1%, normal RV size and systolic function.  Echo (8/20): EF 50-55%.  Echo (11/20): EF 50-55%, GLS -12.7% (not ideal tracing), mildly decreased RV systolic function.   Echo (2/21): EF 55-60%, GLS -17.9%, grade II diastolic dysfunction, normal RV Echo (6/21): EF 55-60% with GLS -17.6% Echo (10/21): EF 55-60%, normal RV, GLS -15.8%.  Echo (2/22): EF 55-60% with GLS -27.5%.  Review of Systems:  All systems reviewed and negative except as per HPI.   Past Medical History:  Diagnosis Date  . Anemia    hx of - during 1st round chemo  . Anxiety   . Arthritis    in fingers, shoulders  . Back pain   . Balance problem   . Breast cancer ([place])   . Breast cancer of upper-outer quadrant of right female breast ([place]) 07/22/2014  . Bunion, right foot   . CHF (congestive heart  failure) ([place])   . Chronic kidney disease    creatininei levels high per pt   . Clumsiness   . Constipation   . Depression   . Diabetes mellitus without complication ([place])    62+ years type 2   . Diabetic retinopathy ([place])    torn retina  . Eating disorder    binge eating  . Epistaxis 08/26/2014  . Fatty liver   . Foot drop, left foot   . HCAP (healthcare-associated pneumonia) 12/18/2014  . Heart murmur    never had any problems  . History of radiation therapy 04/05/15-05/19/15   right chest wall was treated to 50.4 Gy in 28 fractions, right mastectomy scar was treated to 10 Gy in 5 fractions  . Hyperlipidemia   . Hypertension   . Joint pain   . Multiple food allergies   . Neuromuscular disorder ([place])    diabetic neuropathy  . Obesity   . Obstructive sleep apnea on CPAP    does not use cpap all the time has lost 160 lbs   . Ovarian cyst rupture    possible  . Pneumonia   . Pneumonia 11/2014  . Radiation 04/05/15-05/19/15   right chest wall 50.4 Gy, mastectomy scar 10 Gy  . Sleep apnea   . Thyroid nodule 06/2014    Current Outpatient Medications  Medication Sig Dispense Refill  . Accu-Chek Softclix Lancets lancets 1 each by Other route 4 (four) times daily. Test blood sugars 4 x times daily 100 each 1  . aspirin EC 81 MG tablet Take 1 tablet (81 mg total) by mouth daily. 90 tablet 3  . Blood Glucose Calibration (ACCU-CHEK GUIDE CONTROL) LIQD 1 Bottle by In Vitro route as needed. 1 each 0  . Blood Glucose Monitoring Suppl (ACCU-CHEK GUIDE) w/Device KIT 1 each by Does not apply route 4 (four) times daily. Use to test blood sugars 4x a day 1 kit 0  . buPROPion (WELLBUTRIN SR) 200 MG 12 hr tablet Take one tablet by mouth once daily. 90 tablet 0  . CALCIUM CITRATE-VITAMIN D PO Take 1 tablet by mouth 3 (three) times daily.     . carvedilol (COREG) 3.125 MG tablet TAKE 1 TABLET BY MOUTH TWICE DAILY. 180 tablet 3  . cholecalciferol (VITAMIN D) 1000 units tablet Take 1,000 Units by  mouth daily.    . cholestyramine (QUESTRAN) 4 g packet Take 1 packet (4 g total) by mouth 2 (two) times daily as needed. 10 each 12  . Cyanocobalamin (VITAMIN B-12 PO) Take 1 tablet by mouth every 14 (fourteen) days.    . dapagliflozin propanediol (FARXIGA) 10 MG TABS tablet Take 1 tablet (10 mg total) by mouth daily before breakfast. 30 tablet 11  . diphenhydrAMINE (BENADRYL) 25 MG tablet Take 25 mg by mouth daily as needed for allergies or sleep.     [name] glucose blood (ACCU-CHEK GUIDE) test strip CHECK BLOOD SUGAR 4 TIMES A DAY. 400 each 0  . Insulin Pen Needle (SURE COMFORT PEN NEEDLES)  32G X 4 MM MISC Use as instructed to inject insulin and Victoza daily. 200 each 0  . liraglutide (VICTOZA) 18 MG/3ML SOPN Pt to take 1.8 mg qam 27 mL 0  . loratadine (CLARITIN) 10 MG tablet Take 10 mg by mouth as needed for allergies.    [name] losartan (COZAAR) 100 MG tablet Take 1 tablet (100 mg total) by mouth daily. 90 tablet 0  . NEXLIZET 180-10 MG TABS TAKE 1 TABLET BY MOUTH DAILY. 30 tablet 3  . ondansetron (ZOFRAN) 4 MG tablet Take 1 tablet (4 mg total) by mouth every 8 (eight) hours as needed for nausea or vomiting. 20 tablet 0  . Pediatric Multivitamins-Iron (FLINTSTONES PLUS IRON) chewable tablet Chew 1 tablet by mouth 2 (two) times daily.    . Probiotic Product (CVS PROBIOTIC PO) Take 1 capsule by mouth daily.     . prochlorperazine (COMPAZINE) 10 MG tablet Take 1 tablet (10 mg total) by mouth every 6 (six) hours as needed for nausea or vomiting. 30 tablet 0  . Propylene Glycol (SYSTANE BALANCE OP) Place 1-2 drops into both eyes 3 (three) times daily as needed (for dry eyes).     [name] senna-docusate (SENOKOT-S) 8.6-50 MG tablet Take 1 tablet by mouth daily as needed for moderate constipation.     . topiramate (TOPAMAX) 25 MG tablet Take 1 tablet (25 mg total) by mouth daily. 90 tablet 0  . vortioxetine HBr (TRINTELLIX) 20 MG TABS tablet Take 1 tablet (20 mg total) by mouth at bedtime. 90 tablet 0  . insulin  glargine (LANTUS SOLOSTAR) 100 UNIT/ML Solostar Pen Inject 8 Units into the skin daily. 7 mL 3   No current facility-administered medications for this encounter.    Allergies  Allergen Reactions  . Nsaids Other (See Comments)    H/o gastric bypass - avoid NSAIDs  . Tape Hives, Rash and Other (See Comments)    Adhesives in bandaids      Social History   Socioeconomic History  . Marital status: Married    Spouse name: [name]  . Number of children: 0  . Years of education: Bachelor's  . Highest education level: Not on file  Occupational History  . Occupation: Pensions consultant: [employer]  Tobacco Use  . Smoking status: Never Smoker  . Smokeless tobacco: Never Used  Vaping Use  . Vaping Use: Never used  Substance and Sexual Activity  . Alcohol use: Yes    Alcohol/week: 1.0 - 2.0 standard drink    Types: 1 - 2 Standard drinks or equivalent per week    Comment: social  . Drug use: No  . Sexual activity: Yes    Partners: Male    Birth control/protection: Post-menopausal, Other-see comments    Comment: husband vasectomy  Other Topics Concern  . Not on file  Social History Narrative   Patient is married [name]) and lives at home with Darlene [name] husband.   Patient does not have any children.   Patient is working for a [employer].   Patient has a Bachelor's degree.   Patient is right-handed.   Patient does not drink any caffeine.   Social Determinants of Health   Financial Resource Strain: Not on file  Food Insecurity: Not on file  Transportation Needs: Not on file  Physical Activity: Not on file  Stress: Not on file  Social Connections: Not on file  Intimate Partner Violence: Not on file      Family History  Problem  Relation Age of Onset  . CVA Mother   . Heart disease Mother   . Sudden death Mother   . Diabetes Father   . Heart failure Father   . Hypertension Father   . Kidney disease Father   . Obesity Father   . Hypertension  Sister   . Diabetes Brother   . Breast cancer Paternal Grandmother 64  . Diabetes Paternal Uncle   . Ovarian cancer Neg Hx     Vitals:   03/09/20 1020  BP: (!) 150/70  Pulse: 61  SpO2: 98%  Weight: 81.5 kg (179 lb 9.6 oz)    PHYSICAL EXAM: General: NAD Neck: No JVD, no thyromegaly or thyroid nodule.  Lungs: Clear to auscultation bilaterally with normal respiratory effort. CV: Nondisplaced PMI.  Heart regular S1/S2, no S3/S4, no murmur.  No peripheral edema.  No carotid bruit.  Normal pedal pulses.  Abdomen: Soft, nontender, no hepatosplenomegaly, no distention.  Skin: Intact without lesions or rashes.  Neurologic: Alert and oriented x 3.  Psych: Normal affect. Extremities: No clubbing or cyanosis.  HEENT: Normal.   ASSESSMENT & PLAN:  1. Breast cancer: Metastatic.  Currently getting Herceptin and Perjeta long-term.  Echo today shows EF 55-60% with GLS -27.5%. - Continue losartan 100 mg daily.  - Increase Coreg to 6.25 mg bid with elevated BP.  - Repeat echo + followup in 6 months.  2. HTN: BP elevated, will increase Coreg as above.   3. OSA: Continue nightly CPAP   4. CAD: Coronary artery calcium score was elevated, 3653 Agatston units, placing Darlene [name] in the 99th percentile for Darlene [name] age and gender.  This suggests high risk for future cardiac events.  Darlene [name] has no chest pain.  Cardiolite 3/21 with no ischemia/infarction.  Rise in LFTs with Crestor. Unable to obtain Repatha for Darlene [name].  - Darlene [name] is now on bempedoic acid with excellent LDL.  - Continue ASA 81, ARB, and beta blocker.  5. CKD stage 3: Suspect diabetic nephropathy.  I will start Darlene [name] on dapagliflozin 10 mg daily for renoprotection and will decrease Lantus to 8 units daily. BMET in 2 wks.   Followup in 6 months with echo  Loralie Champagne 03/09/2020

## 2020-03-10 ENCOUNTER — Encounter (HOSPITAL_COMMUNITY): Payer: Self-pay

## 2020-03-10 DIAGNOSIS — M62562 Muscle wasting and atrophy, not elsewhere classified, left lower leg: Secondary | ICD-10-CM | POA: Diagnosis not present

## 2020-03-10 DIAGNOSIS — M62561 Muscle wasting and atrophy, not elsewhere classified, right lower leg: Secondary | ICD-10-CM | POA: Diagnosis not present

## 2020-03-10 DIAGNOSIS — R269 Unspecified abnormalities of gait and mobility: Secondary | ICD-10-CM | POA: Diagnosis not present

## 2020-03-10 DIAGNOSIS — R296 Repeated falls: Secondary | ICD-10-CM | POA: Diagnosis not present

## 2020-03-10 MED ORDER — CARVEDILOL 6.25 MG PO TABS: 6.2500 mg | ORAL_TABLET | Freq: Two times a day (BID) | ORAL | 3 refills | Status: DC

## 2020-03-10 NOTE — Addendum Note (Signed)
Encounter addended by: Shonna Chock, CMA on: 07/05/7090 8:43 AM  Actions taken: Order list changed, Clinical Note Signed

## 2020-03-13 ENCOUNTER — Telehealth: Payer: Self-pay | Admitting: Family Medicine

## 2020-03-13 DIAGNOSIS — R296 Repeated falls: Secondary | ICD-10-CM | POA: Diagnosis not present

## 2020-03-13 DIAGNOSIS — M62561 Muscle wasting and atrophy, not elsewhere classified, right lower leg: Secondary | ICD-10-CM | POA: Diagnosis not present

## 2020-03-13 DIAGNOSIS — R269 Unspecified abnormalities of gait and mobility: Secondary | ICD-10-CM | POA: Diagnosis not present

## 2020-03-13 DIAGNOSIS — M62562 Muscle wasting and atrophy, not elsewhere classified, left lower leg: Secondary | ICD-10-CM | POA: Diagnosis not present

## 2020-03-13 NOTE — Telephone Encounter (Signed)
lvm for patient to return call to get appointment scheduled from appointment request

## 2020-03-14 DIAGNOSIS — M62562 Muscle wasting and atrophy, not elsewhere classified, left lower leg: Secondary | ICD-10-CM | POA: Diagnosis not present

## 2020-03-14 DIAGNOSIS — R269 Unspecified abnormalities of gait and mobility: Secondary | ICD-10-CM | POA: Diagnosis not present

## 2020-03-14 DIAGNOSIS — M62561 Muscle wasting and atrophy, not elsewhere classified, right lower leg: Secondary | ICD-10-CM | POA: Diagnosis not present

## 2020-03-14 DIAGNOSIS — R296 Repeated falls: Secondary | ICD-10-CM | POA: Diagnosis not present

## 2020-03-15 ENCOUNTER — Inpatient Hospital Stay: Payer: Medicare PPO | Attending: Oncology

## 2020-03-15 ENCOUNTER — Inpatient Hospital Stay: Payer: Medicare PPO

## 2020-03-15 ENCOUNTER — Other Ambulatory Visit: Payer: Self-pay

## 2020-03-15 VITALS — BP 133/73 | HR 63 | Temp 98.2°F | Resp 18 | Wt 174.4 lb

## 2020-03-15 DIAGNOSIS — C787 Secondary malignant neoplasm of liver and intrahepatic bile duct: Secondary | ICD-10-CM

## 2020-03-15 DIAGNOSIS — Z171 Estrogen receptor negative status [ER-]: Secondary | ICD-10-CM

## 2020-03-15 DIAGNOSIS — C50411 Malignant neoplasm of upper-outer quadrant of right female breast: Secondary | ICD-10-CM

## 2020-03-15 DIAGNOSIS — Z5112 Encounter for antineoplastic immunotherapy: Secondary | ICD-10-CM | POA: Insufficient documentation

## 2020-03-15 DIAGNOSIS — Z95828 Presence of other vascular implants and grafts: Secondary | ICD-10-CM

## 2020-03-15 DIAGNOSIS — C50911 Malignant neoplasm of unspecified site of right female breast: Secondary | ICD-10-CM

## 2020-03-15 DIAGNOSIS — C801 Malignant (primary) neoplasm, unspecified: Secondary | ICD-10-CM

## 2020-03-15 LAB — CBC WITH DIFFERENTIAL/PLATELET
Abs Immature Granulocytes: 0.01 10*3/uL (ref 0.00–0.07)
Basophils Absolute: 0 10*3/uL (ref 0.0–0.1)
Basophils Relative: 0 %
Eosinophils Absolute: 0.1 10*3/uL (ref 0.0–0.5)
Eosinophils Relative: 2 %
HCT: 30 % — ABNORMAL LOW (ref 36.0–46.0)
Hemoglobin: 10.2 g/dL — ABNORMAL LOW (ref 12.0–15.0)
Immature Granulocytes: 0 %
Lymphocytes Relative: 23 %
Lymphs Abs: 1 10*3/uL (ref 0.7–4.0)
MCH: 32 pg (ref 26.0–34.0)
MCHC: 34 g/dL (ref 30.0–36.0)
MCV: 94 fL (ref 80.0–100.0)
Monocytes Absolute: 0.3 10*3/uL (ref 0.1–1.0)
Monocytes Relative: 7 %
Neutro Abs: 3 10*3/uL (ref 1.7–7.7)
Neutrophils Relative %: 68 %
Platelets: 194 10*3/uL (ref 150–400)
RBC: 3.19 MIL/uL — ABNORMAL LOW (ref 3.87–5.11)
RDW: 12.5 % (ref 11.5–15.5)
WBC: 4.5 10*3/uL (ref 4.0–10.5)
nRBC: 0 % (ref 0.0–0.2)

## 2020-03-15 LAB — COMPREHENSIVE METABOLIC PANEL
ALT: 15 U/L (ref 0–44)
AST: 34 U/L (ref 15–41)
Albumin: 4 g/dL (ref 3.5–5.0)
Alkaline Phosphatase: 55 U/L (ref 38–126)
Anion gap: 8 (ref 5–15)
BUN: 48 mg/dL — ABNORMAL HIGH (ref 8–23)
CO2: 21 mmol/L — ABNORMAL LOW (ref 22–32)
Calcium: 8.9 mg/dL (ref 8.9–10.3)
Chloride: 112 mmol/L — ABNORMAL HIGH (ref 98–111)
Creatinine, Ser: 2.34 mg/dL — ABNORMAL HIGH (ref 0.44–1.00)
GFR, Estimated: 22 mL/min — ABNORMAL LOW (ref >60)
Glucose, Bld: 144 mg/dL — ABNORMAL HIGH (ref 70–99)
Potassium: 4.4 mmol/L (ref 3.5–5.1)
Sodium: 141 mmol/L (ref 135–145)
Total Bilirubin: 0.4 mg/dL (ref 0.3–1.2)
Total Protein: 6.7 g/dL (ref 6.5–8.1)

## 2020-03-15 MED ORDER — SODIUM CHLORIDE 0.9% FLUSH
10.0000 mL | INTRAVENOUS | Status: DC | PRN
  Administered 2020-03-15: 10 mL via INTRAVENOUS
  Filled 2020-03-15: qty 10

## 2020-03-15 MED ORDER — DIPHENHYDRAMINE HCL 25 MG PO CAPS
ORAL_CAPSULE | ORAL | Status: AC
  Filled 2020-03-15: qty 1

## 2020-03-15 MED ORDER — ACETAMINOPHEN 325 MG PO TABS
ORAL_TABLET | ORAL | Status: AC
  Filled 2020-03-15: qty 2

## 2020-03-15 MED ORDER — DIPHENHYDRAMINE HCL 25 MG PO CAPS
25.0000 mg | ORAL_CAPSULE | Freq: Once | ORAL | Status: AC
  Administered 2020-03-15: 25 mg via ORAL

## 2020-03-15 MED ORDER — ACETAMINOPHEN 325 MG PO TABS
650.0000 mg | ORAL_TABLET | Freq: Once | ORAL | Status: AC
  Administered 2020-03-15: 650 mg via ORAL

## 2020-03-15 MED ORDER — TRASTUZUMAB-ANNS CHEMO 420 MG IV SOLR
450.0000 mg | Freq: Once | INTRAVENOUS | Status: AC
  Administered 2020-03-15: 450 mg via INTRAVENOUS
  Filled 2020-03-15: qty 21.43

## 2020-03-15 MED ORDER — SODIUM CHLORIDE 0.9 % IV SOLN
420.0000 mg | Freq: Once | INTRAVENOUS | Status: AC
  Administered 2020-03-15: 420 mg via INTRAVENOUS
  Filled 2020-03-15: qty 14

## 2020-03-15 MED ORDER — HEPARIN SOD (PORK) LOCK FLUSH 100 UNIT/ML IV SOLN
500.0000 [IU] | Freq: Once | INTRAVENOUS | Status: DC | PRN
  Filled 2020-03-15: qty 5

## 2020-03-15 MED ORDER — SODIUM CHLORIDE 0.9 % IV SOLN
Freq: Once | INTRAVENOUS | Status: AC
  Filled 2020-03-15: qty 250

## 2020-03-15 MED ORDER — SODIUM CHLORIDE 0.9% FLUSH
10.0000 mL | INTRAVENOUS | Status: DC | PRN
  Filled 2020-03-15: qty 10

## 2020-03-15 NOTE — Patient Instructions (Signed)
[facility] Discharge Instructions for Patients Receiving Chemotherapy  Today you received the following chemotherapy agents Traztuzumab(Herceptin), Pertuzumab(Perjeta).  To help prevent nausea and vomiting after your treatment, we encourage you to take your nausea medication as directed.  If you develop nausea and vomiting that is not controlled by your nausea medication, call the clinic.   BELOW ARE SYMPTOMS THAT SHOULD BE REPORTED IMMEDIATELY:  *FEVER GREATER THAN 100.5 F  *CHILLS WITH OR WITHOUT FEVER  NAUSEA AND VOMITING THAT IS NOT CONTROLLED WITH YOUR NAUSEA MEDICATION  *UNUSUAL SHORTNESS OF BREATH  *UNUSUAL BRUISING OR BLEEDING  TENDERNESS IN MOUTH AND THROAT WITH OR WITHOUT PRESENCE OF ULCERS  *URINARY PROBLEMS  *BOWEL PROBLEMS  UNUSUAL RASH Items with * indicate a potential emergency and should be followed up as soon as possible.  Feel free to call the clinic should you have any questions or concerns. The clinic phone number is [phone].  Please show the [place] at check-in to the Emergency Department and triage nurse.

## 2020-03-16 ENCOUNTER — Encounter: Payer: Self-pay | Admitting: Oncology

## 2020-03-16 DIAGNOSIS — M62561 Muscle wasting and atrophy, not elsewhere classified, right lower leg: Secondary | ICD-10-CM | POA: Diagnosis not present

## 2020-03-16 DIAGNOSIS — R296 Repeated falls: Secondary | ICD-10-CM | POA: Diagnosis not present

## 2020-03-16 DIAGNOSIS — M62562 Muscle wasting and atrophy, not elsewhere classified, left lower leg: Secondary | ICD-10-CM | POA: Diagnosis not present

## 2020-03-16 DIAGNOSIS — R269 Unspecified abnormalities of gait and mobility: Secondary | ICD-10-CM | POA: Diagnosis not present

## 2020-03-20 DIAGNOSIS — R269 Unspecified abnormalities of gait and mobility: Secondary | ICD-10-CM | POA: Diagnosis not present

## 2020-03-20 DIAGNOSIS — R296 Repeated falls: Secondary | ICD-10-CM | POA: Diagnosis not present

## 2020-03-20 DIAGNOSIS — M62561 Muscle wasting and atrophy, not elsewhere classified, right lower leg: Secondary | ICD-10-CM | POA: Diagnosis not present

## 2020-03-20 DIAGNOSIS — M62562 Muscle wasting and atrophy, not elsewhere classified, left lower leg: Secondary | ICD-10-CM | POA: Diagnosis not present

## 2020-03-22 DIAGNOSIS — M62562 Muscle wasting and atrophy, not elsewhere classified, left lower leg: Secondary | ICD-10-CM | POA: Diagnosis not present

## 2020-03-22 DIAGNOSIS — M62561 Muscle wasting and atrophy, not elsewhere classified, right lower leg: Secondary | ICD-10-CM | POA: Diagnosis not present

## 2020-03-22 DIAGNOSIS — R296 Repeated falls: Secondary | ICD-10-CM | POA: Diagnosis not present

## 2020-03-22 DIAGNOSIS — R269 Unspecified abnormalities of gait and mobility: Secondary | ICD-10-CM | POA: Diagnosis not present

## 2020-03-23 ENCOUNTER — Other Ambulatory Visit: Payer: Self-pay

## 2020-03-23 ENCOUNTER — Encounter (HOSPITAL_COMMUNITY): Payer: Self-pay

## 2020-03-23 ENCOUNTER — Ambulatory Visit (HOSPITAL_COMMUNITY)
Admission: RE | Admit: 2020-03-23 | Discharge: 2020-03-23 | Disposition: A | Discharge status: Home / Self Care | Payer: Medicare PPO | Source: Ambulatory Visit | Attending: Internal Medicine | Admitting: Internal Medicine

## 2020-03-23 DIAGNOSIS — C50911 Malignant neoplasm of unspecified site of right female breast: Secondary | ICD-10-CM | POA: Diagnosis not present

## 2020-03-23 LAB — BASIC METABOLIC PANEL
Anion gap: 8 (ref 5–15)
BUN: 43 mg/dL — ABNORMAL HIGH (ref 8–23)
CO2: 21 mmol/L — ABNORMAL LOW (ref 22–32)
Calcium: 9.4 mg/dL (ref 8.9–10.3)
Chloride: 112 mmol/L — ABNORMAL HIGH (ref 98–111)
Creatinine, Ser: 2.41 mg/dL — ABNORMAL HIGH (ref 0.44–1.00)
GFR, Estimated: 21 mL/min — ABNORMAL LOW (ref >60)
Glucose, Bld: 119 mg/dL — ABNORMAL HIGH (ref 70–99)
Potassium: 4.8 mmol/L (ref 3.5–5.1)
Sodium: 141 mmol/L (ref 135–145)

## 2020-03-24 ENCOUNTER — Telehealth (HOSPITAL_COMMUNITY): Payer: Self-pay | Admitting: Surgery

## 2020-03-24 DIAGNOSIS — M62561 Muscle wasting and atrophy, not elsewhere classified, right lower leg: Secondary | ICD-10-CM | POA: Diagnosis not present

## 2020-03-24 DIAGNOSIS — M62562 Muscle wasting and atrophy, not elsewhere classified, left lower leg: Secondary | ICD-10-CM | POA: Diagnosis not present

## 2020-03-24 DIAGNOSIS — R269 Unspecified abnormalities of gait and mobility: Secondary | ICD-10-CM | POA: Diagnosis not present

## 2020-03-24 DIAGNOSIS — I10 Essential (primary) hypertension: Secondary | ICD-10-CM

## 2020-03-24 DIAGNOSIS — R296 Repeated falls: Secondary | ICD-10-CM | POA: Diagnosis not present

## 2020-03-24 NOTE — Telephone Encounter (Signed)
I called Ms. Adcock to let her know about lab results and recommendations from Dr. Aundra Dubin.  I have asked her to slightly increase her fluid intake as well as come back for repeat BMET in 10days.  She has BMET appt for 04/04/2020.

## 2020-03-27 ENCOUNTER — Telehealth (HOSPITAL_COMMUNITY): Payer: Self-pay | Admitting: *Deleted

## 2020-03-27 DIAGNOSIS — M62561 Muscle wasting and atrophy, not elsewhere classified, right lower leg: Secondary | ICD-10-CM | POA: Diagnosis not present

## 2020-03-27 DIAGNOSIS — M62562 Muscle wasting and atrophy, not elsewhere classified, left lower leg: Secondary | ICD-10-CM | POA: Diagnosis not present

## 2020-03-27 DIAGNOSIS — R296 Repeated falls: Secondary | ICD-10-CM | POA: Diagnosis not present

## 2020-03-27 DIAGNOSIS — R269 Unspecified abnormalities of gait and mobility: Secondary | ICD-10-CM | POA: Diagnosis not present

## 2020-03-27 NOTE — Telephone Encounter (Signed)
Referral faxed to [facility]  

## 2020-03-29 DIAGNOSIS — R296 Repeated falls: Secondary | ICD-10-CM | POA: Diagnosis not present

## 2020-03-29 DIAGNOSIS — M62561 Muscle wasting and atrophy, not elsewhere classified, right lower leg: Secondary | ICD-10-CM | POA: Diagnosis not present

## 2020-03-29 DIAGNOSIS — M62562 Muscle wasting and atrophy, not elsewhere classified, left lower leg: Secondary | ICD-10-CM | POA: Diagnosis not present

## 2020-03-29 DIAGNOSIS — R269 Unspecified abnormalities of gait and mobility: Secondary | ICD-10-CM | POA: Diagnosis not present

## 2020-03-30 NOTE — Telephone Encounter (Signed)
Received fax back from [facility], pt is sch to see Dr Joylene Grapes on 3/24 at 9 am

## 2020-03-31 DIAGNOSIS — R296 Repeated falls: Secondary | ICD-10-CM | POA: Diagnosis not present

## 2020-03-31 DIAGNOSIS — R269 Unspecified abnormalities of gait and mobility: Secondary | ICD-10-CM | POA: Diagnosis not present

## 2020-03-31 DIAGNOSIS — M62561 Muscle wasting and atrophy, not elsewhere classified, right lower leg: Secondary | ICD-10-CM | POA: Diagnosis not present

## 2020-03-31 DIAGNOSIS — M62562 Muscle wasting and atrophy, not elsewhere classified, left lower leg: Secondary | ICD-10-CM | POA: Diagnosis not present

## 2020-04-03 ENCOUNTER — Encounter (INDEPENDENT_AMBULATORY_CARE_PROVIDER_SITE_OTHER): Payer: Self-pay | Admitting: Family Medicine

## 2020-04-03 ENCOUNTER — Other Ambulatory Visit: Payer: Self-pay

## 2020-04-03 ENCOUNTER — Ambulatory Visit (INDEPENDENT_AMBULATORY_CARE_PROVIDER_SITE_OTHER): Payer: Medicare PPO | Admitting: Family Medicine

## 2020-04-03 VITALS — BP 94/60 | HR 65 | Temp 97.6°F | Ht 69.0 in | Wt 169.0 lb

## 2020-04-03 DIAGNOSIS — Z6830 Body mass index (BMI) 30.0-30.9, adult: Secondary | ICD-10-CM | POA: Diagnosis not present

## 2020-04-03 DIAGNOSIS — F3289 Other specified depressive episodes: Secondary | ICD-10-CM

## 2020-04-03 DIAGNOSIS — N184 Chronic kidney disease, stage 4 (severe): Secondary | ICD-10-CM | POA: Diagnosis not present

## 2020-04-03 DIAGNOSIS — R269 Unspecified abnormalities of gait and mobility: Secondary | ICD-10-CM | POA: Diagnosis not present

## 2020-04-03 DIAGNOSIS — E1122 Type 2 diabetes mellitus with diabetic chronic kidney disease: Secondary | ICD-10-CM | POA: Diagnosis not present

## 2020-04-03 DIAGNOSIS — M62562 Muscle wasting and atrophy, not elsewhere classified, left lower leg: Secondary | ICD-10-CM | POA: Diagnosis not present

## 2020-04-03 DIAGNOSIS — Z794 Long term (current) use of insulin: Secondary | ICD-10-CM | POA: Diagnosis not present

## 2020-04-03 DIAGNOSIS — E669 Obesity, unspecified: Secondary | ICD-10-CM

## 2020-04-03 DIAGNOSIS — M62561 Muscle wasting and atrophy, not elsewhere classified, right lower leg: Secondary | ICD-10-CM | POA: Diagnosis not present

## 2020-04-03 DIAGNOSIS — R296 Repeated falls: Secondary | ICD-10-CM | POA: Diagnosis not present

## 2020-04-03 MED ORDER — TOPIRAMATE 25 MG PO TABS: 25.0000 mg | ORAL_TABLET | Freq: Every day | ORAL | 0 refills | Status: DC

## 2020-04-03 MED ORDER — BUPROPION HCL ER (SR) 200 MG PO TB12: ORAL_TABLET | ORAL | 0 refills | Status: DC

## 2020-04-03 MED ORDER — VORTIOXETINE HBR 20 MG PO TABS
20.0000 mg | ORAL_TABLET | Freq: Every day | ORAL | 0 refills | Status: DC
Start: 2020-04-03 — End: 2020-07-03

## 2020-04-03 MED ORDER — SURE COMFORT PEN NEEDLES 32G X 4 MM MISC
0 refills | Status: DC
Start: 2020-04-03 — End: 2020-08-02

## 2020-04-03 MED ORDER — VICTOZA 18 MG/3ML ~~LOC~~ SOPN: PEN_INJECTOR | SUBCUTANEOUS | 0 refills | Status: DC

## 2020-04-04 ENCOUNTER — Other Ambulatory Visit (HOSPITAL_COMMUNITY): Payer: Medicare PPO

## 2020-04-04 ENCOUNTER — Ambulatory Visit (HOSPITAL_COMMUNITY)
Admission: RE | Admit: 2020-04-04 | Discharge: 2020-04-04 | Disposition: A | Discharge status: Home / Self Care | Payer: Medicare PPO | Source: Ambulatory Visit | Attending: Internal Medicine | Admitting: Internal Medicine

## 2020-04-04 DIAGNOSIS — I10 Essential (primary) hypertension: Secondary | ICD-10-CM | POA: Diagnosis not present

## 2020-04-04 LAB — BASIC METABOLIC PANEL WITH GFR
Anion gap: 5 (ref 5–15)
BUN: 41 mg/dL — ABNORMAL HIGH (ref 8–23)
CO2: 24 mmol/L (ref 22–32)
Calcium: 9.1 mg/dL (ref 8.9–10.3)
Chloride: 110 mmol/L (ref 98–111)
Creatinine, Ser: 2.22 mg/dL — ABNORMAL HIGH (ref 0.44–1.00)
GFR, Estimated: 24 mL/min — ABNORMAL LOW
Glucose, Bld: 127 mg/dL — ABNORMAL HIGH (ref 70–99)
Potassium: 4.5 mmol/L (ref 3.5–5.1)
Sodium: 139 mmol/L (ref 135–145)

## 2020-04-04 MED ORDER — ACCU-CHEK SOFTCLIX LANCETS MISC: 1.0000 | Freq: Four times a day (QID) | 1 refills | Status: DC

## 2020-04-05 ENCOUNTER — Telehealth (HOSPITAL_COMMUNITY): Payer: Self-pay | Admitting: Pharmacy Technician

## 2020-04-05 ENCOUNTER — Other Ambulatory Visit (HOSPITAL_COMMUNITY): Payer: Self-pay

## 2020-04-05 DIAGNOSIS — R296 Repeated falls: Secondary | ICD-10-CM | POA: Diagnosis not present

## 2020-04-05 DIAGNOSIS — M62561 Muscle wasting and atrophy, not elsewhere classified, right lower leg: Secondary | ICD-10-CM | POA: Diagnosis not present

## 2020-04-05 DIAGNOSIS — M62562 Muscle wasting and atrophy, not elsewhere classified, left lower leg: Secondary | ICD-10-CM | POA: Diagnosis not present

## 2020-04-05 DIAGNOSIS — R269 Unspecified abnormalities of gait and mobility: Secondary | ICD-10-CM | POA: Diagnosis not present

## 2020-04-05 NOTE — Telephone Encounter (Signed)
I received a PA request for Wilder Glade for this patient. Upon further review, it looks like the pharmacy was billing a copay card instead of her insurance.   Called and spoke with the pharmacy. They were able to get [facility] billed, 30 day co-pay $50. No PA needed at this time.  Charlann Boxer, CPhT

## 2020-04-06 DIAGNOSIS — E1122 Type 2 diabetes mellitus with diabetic chronic kidney disease: Secondary | ICD-10-CM | POA: Diagnosis not present

## 2020-04-06 DIAGNOSIS — Z853 Personal history of malignant neoplasm of breast: Secondary | ICD-10-CM | POA: Diagnosis not present

## 2020-04-06 DIAGNOSIS — D631 Anemia in chronic kidney disease: Secondary | ICD-10-CM | POA: Diagnosis not present

## 2020-04-06 DIAGNOSIS — I129 Hypertensive chronic kidney disease with stage 1 through stage 4 chronic kidney disease, or unspecified chronic kidney disease: Secondary | ICD-10-CM | POA: Diagnosis not present

## 2020-04-06 DIAGNOSIS — N184 Chronic kidney disease, stage 4 (severe): Secondary | ICD-10-CM | POA: Diagnosis not present

## 2020-04-06 NOTE — Progress Notes (Signed)
Chief Complaint:   OBESITY Saraiyah is here to discuss her progress with her obesity treatment plan along with follow-up of her obesity related diagnoses. Reed is on keeping a food journal and adhering to recommended goals of 1600-1800 calories and 85+ grams of protein daily and states she is following her eating plan approximately 80% of the time. Shenna states she is doing physical therapy and at home exercises for 20-60 minutes 3-4 times per week.  Today's visit was #: 46 Starting weight: 225 lbs Starting date: 08/09/2016 Today's weight: 169 lbs Today's date: 04/03/2020 Total lbs lost to date: 56 Total lbs lost since last in-office visit: 0  Interim History: Quincie has done very well with maintaining her weight. She has worked hard to decrease sodium in her diet due to worsening GFR. She has an appointment this week with Nephrology.  Subjective:   1. Type 2 diabetes mellitus with stage 4 chronic kidney disease, with long-term current use of insulin (HCC) [name]'s fasting BGs range mostly between 80 and 100, and her 2 hour post prandial mostly always below 140 with signs of hypoglycemia. She decreased her Lantus to 8 units and she is no on Farixga.  2. Other depression with emotional eating Loa's mood is stable on her medications. She is working on [employer] behaviors, and she is doing better. She denies worsening insomnia, and no elevated blood pressure.  Assessment/Plan:   1. Type 2 diabetes mellitus with stage 4 chronic kidney disease, with long-term current use of insulin (HCC) Good blood sugar control is important to decrease the likelihood of diabetic complications such as nephropathy, neuropathy, limb loss, blindness, coronary artery disease, and death. Intensive lifestyle modification including diet, exercise and weight loss are the first line of treatment for diabetes. We will refill pen needles #100, and Lancets #200 with no refills. We  will refill Victoza for 90 days with no refills.  - Insulin Pen Needle (SURE COMFORT PEN NEEDLES) 32G X 4 MM MISC; Use as instructed to inject insulin and Victoza daily.  Dispense: 200 each; Refill: 0 - liraglutide (VICTOZA) 18 MG/3ML SOPN; Pt to take 1.8 mg qam  Dispense: 27 mL; Refill: 0 - Accu-Chek Softclix Lancets lancets; 1 each by Other route 4 (four) times daily. Test blood sugars 4 x times daily  Dispense: 200 each; Refill: 1  2. Other depression with emotional eating Behavior modification techniques were discussed today to help Perris deal with her emotional/non-hunger eating behaviors. We will refill Topamax for 90 days with no refills, and will refill Trintellix for 90 days with no refills; and we will refill Wellbutrin SR for 90 days with no refills. Orders and follow up as documented in patient record.   - topiramate (TOPAMAX) 25 MG tablet; Take 1 tablet (25 mg total) by mouth daily.  Dispense: 90 tablet; Refill: 0 - vortioxetine HBr (TRINTELLIX) 20 MG TABS tablet; Take 1 tablet (20 mg total) by mouth at bedtime.  Dispense: 90 tablet; Refill: 0 - buPROPion (WELLBUTRIN SR) 200 MG 12 hr tablet; Take one tablet by mouth once daily.  Dispense: 90 tablet; Refill: 0  3. Class 1 obesity with serious comorbidity and body mass index (BMI) of 30.0 to 30.9 in adult, unspecified obesity type Karlye is currently in the action stage of change. As such, her goal is to continue with weight loss efforts. She has agreed to keeping a food journal and adhering to recommended goals of 1600-1800 calories and 60 grams of protein daily.  Geoffrey's protein intake decreased to 0.8 mg/kg of lean weight and the importance of plant protein was emphasized.   Exercise goals: As is.  Behavioral modification strategies: increasing lean protein intake.  Bryahna has agreed to follow-up with our clinic in 3 months. She was informed of the importance of frequent follow-up visits to maximize her success with  intensive lifestyle modifications for her multiple health conditions.   Objective:   Blood pressure 94/60, pulse 65, temperature 97.6 F (36.4 C), height 5\' 9"  (1.753 m), weight 169 lb (76.7 kg), last menstrual period 10/22/2007, SpO2 99 %. Body mass index is 24.96 kg/m.  General: Cooperative, alert, well developed, in no acute distress. HEENT: Conjunctivae and lids unremarkable. Cardiovascular: Regular rhythm.  Lungs: Normal work of breathing. Neurologic: No focal deficits.   Lab Results  Component Value Date   CREATININE 2.22 (H) 04/04/2020   BUN 41 (H) 04/04/2020   NA 139 04/04/2020   K 4.5 04/04/2020   CL 110 04/04/2020   CO2 24 04/04/2020   Lab Results  Component Value Date   ALT 15 03/15/2020   AST 34 03/15/2020   ALKPHOS 55 03/15/2020   BILITOT 0.4 03/15/2020   Lab Results  Component Value Date   HGBA1C 5.2 01/04/2020   HGBA1C 5.4 10/05/2019   HGBA1C 5.6 05/11/2019   HGBA1C 5.5 01/25/2019   HGBA1C 5.4 08/27/2018   Lab Results  Component Value Date   INSULIN 7.6 01/25/2019   INSULIN 15.3 03/11/2017   INSULIN 18.3 08/09/2016   Lab Results  Component Value Date   TSH 1.54 02/07/2017   Lab Results  Component Value Date   CHOL 88 (L) 10/25/2019   HDL 58 10/25/2019   LDLCALC 18 10/25/2019   TRIG 48 10/25/2019   CHOLHDL 1.5 10/25/2019   Lab Results  Component Value Date   WBC 4.5 03/15/2020   HGB 10.2 (L) 03/15/2020   HCT 30.0 (L) 03/15/2020   MCV 94.0 03/15/2020   PLT 194 03/15/2020   Lab Results  Component Value Date   IRON 58 02/18/2013   TIBC 305 02/18/2013   FERRITIN 133 10/31/2014    Obesity Behavioral Intervention:   Approximately 15 minutes were spent on the discussion below.  ASK: We discussed the diagnosis of obesity with [name] today and Kaleeya agreed to give [name] permission to discuss obesity behavioral modification therapy today.  ASSESS: Annabelle has the diagnosis of obesity and her BMI today is 24.95. Lianna is in the  action stage of change.   ADVISE: Jackye was educated on the multiple health risks of obesity as well as the benefit of weight loss to improve her health. She was advised of the need for long term treatment and the importance of lifestyle modifications to improve her current health and to decrease her risk of future health problems.  AGREE: Multiple dietary modification options and treatment options were discussed and Corynne agreed to follow the recommendations documented in the above note.  ARRANGE: Kalianne was educated on the importance of frequent visits to treat obesity as outlined per CMS and USPSTF guidelines and agreed to schedule her next follow up appointment today.  Attestation Statements:   Reviewed by clinician on day of visit: allergies, medications, problem list, medical history, surgical history, family history, social history, and previous encounter notes.   I, [name], am acting as transcriptionist for Dennard Nip, MD.  I have reviewed the above documentation for accuracy and completeness, and I agree with the above. -  Dennard Nip, MD

## 2020-04-07 ENCOUNTER — Other Ambulatory Visit: Payer: Self-pay | Admitting: Oncology

## 2020-04-07 DIAGNOSIS — R296 Repeated falls: Secondary | ICD-10-CM | POA: Diagnosis not present

## 2020-04-07 DIAGNOSIS — R269 Unspecified abnormalities of gait and mobility: Secondary | ICD-10-CM | POA: Diagnosis not present

## 2020-04-07 DIAGNOSIS — M62562 Muscle wasting and atrophy, not elsewhere classified, left lower leg: Secondary | ICD-10-CM | POA: Diagnosis not present

## 2020-04-07 DIAGNOSIS — C50411 Malignant neoplasm of upper-outer quadrant of right female breast: Secondary | ICD-10-CM

## 2020-04-07 DIAGNOSIS — M62561 Muscle wasting and atrophy, not elsewhere classified, right lower leg: Secondary | ICD-10-CM | POA: Diagnosis not present

## 2020-04-10 DIAGNOSIS — M62562 Muscle wasting and atrophy, not elsewhere classified, left lower leg: Secondary | ICD-10-CM | POA: Diagnosis not present

## 2020-04-10 DIAGNOSIS — R269 Unspecified abnormalities of gait and mobility: Secondary | ICD-10-CM | POA: Diagnosis not present

## 2020-04-10 DIAGNOSIS — M62561 Muscle wasting and atrophy, not elsewhere classified, right lower leg: Secondary | ICD-10-CM | POA: Diagnosis not present

## 2020-04-10 DIAGNOSIS — R296 Repeated falls: Secondary | ICD-10-CM | POA: Diagnosis not present

## 2020-04-12 ENCOUNTER — Inpatient Hospital Stay: Payer: Medicare PPO

## 2020-04-12 ENCOUNTER — Inpatient Hospital Stay: Payer: Medicare PPO | Attending: Oncology

## 2020-04-12 ENCOUNTER — Other Ambulatory Visit: Payer: Self-pay

## 2020-04-12 VITALS — BP 119/65 | HR 61 | Temp 98.1°F | Resp 18 | Wt 175.2 lb

## 2020-04-12 DIAGNOSIS — C773 Secondary and unspecified malignant neoplasm of axilla and upper limb lymph nodes: Secondary | ICD-10-CM | POA: Insufficient documentation

## 2020-04-12 DIAGNOSIS — Z171 Estrogen receptor negative status [ER-]: Secondary | ICD-10-CM

## 2020-04-12 DIAGNOSIS — Z923 Personal history of irradiation: Secondary | ICD-10-CM | POA: Insufficient documentation

## 2020-04-12 DIAGNOSIS — C50911 Malignant neoplasm of unspecified site of right female breast: Secondary | ICD-10-CM

## 2020-04-12 DIAGNOSIS — C787 Secondary malignant neoplasm of liver and intrahepatic bile duct: Secondary | ICD-10-CM | POA: Insufficient documentation

## 2020-04-12 DIAGNOSIS — C50411 Malignant neoplasm of upper-outer quadrant of right female breast: Secondary | ICD-10-CM

## 2020-04-12 DIAGNOSIS — Z9011 Acquired absence of right breast and nipple: Secondary | ICD-10-CM | POA: Diagnosis not present

## 2020-04-12 DIAGNOSIS — Z95828 Presence of other vascular implants and grafts: Secondary | ICD-10-CM

## 2020-04-12 DIAGNOSIS — Z5112 Encounter for antineoplastic immunotherapy: Secondary | ICD-10-CM | POA: Diagnosis not present

## 2020-04-12 DIAGNOSIS — C801 Malignant (primary) neoplasm, unspecified: Secondary | ICD-10-CM

## 2020-04-12 DIAGNOSIS — C50811 Malignant neoplasm of overlapping sites of right female breast: Secondary | ICD-10-CM | POA: Insufficient documentation

## 2020-04-12 LAB — CBC WITH DIFFERENTIAL/PLATELET
Abs Immature Granulocytes: 0.02 10*3/uL (ref 0.00–0.07)
Basophils Absolute: 0 10*3/uL (ref 0.0–0.1)
Basophils Relative: 0 %
Eosinophils Absolute: 0.1 10*3/uL (ref 0.0–0.5)
Eosinophils Relative: 3 %
HCT: 29.9 % — ABNORMAL LOW (ref 36.0–46.0)
Hemoglobin: 10 g/dL — ABNORMAL LOW (ref 12.0–15.0)
Immature Granulocytes: 0 %
Lymphocytes Relative: 19 %
Lymphs Abs: 1 10*3/uL (ref 0.7–4.0)
MCH: 31.7 pg (ref 26.0–34.0)
MCHC: 33.4 g/dL (ref 30.0–36.0)
MCV: 94.9 fL (ref 80.0–100.0)
Monocytes Absolute: 0.4 10*3/uL (ref 0.1–1.0)
Monocytes Relative: 7 %
Neutro Abs: 3.6 10*3/uL (ref 1.7–7.7)
Neutrophils Relative %: 71 %
Platelets: 163 10*3/uL (ref 150–400)
RBC: 3.15 MIL/uL — ABNORMAL LOW (ref 3.87–5.11)
RDW: 12.5 % (ref 11.5–15.5)
WBC: 5.1 10*3/uL (ref 4.0–10.5)
nRBC: 0 % (ref 0.0–0.2)

## 2020-04-12 LAB — COMPREHENSIVE METABOLIC PANEL
ALT: 15 U/L (ref 0–44)
AST: 22 U/L (ref 15–41)
Albumin: 3.9 g/dL (ref 3.5–5.0)
Alkaline Phosphatase: 47 U/L (ref 38–126)
Anion gap: 10 (ref 5–15)
BUN: 55 mg/dL — ABNORMAL HIGH (ref 8–23)
CO2: 22 mmol/L (ref 22–32)
Calcium: 8.8 mg/dL — ABNORMAL LOW (ref 8.9–10.3)
Chloride: 110 mmol/L (ref 98–111)
Creatinine, Ser: 2.6 mg/dL — ABNORMAL HIGH (ref 0.44–1.00)
GFR, Estimated: 20 mL/min — ABNORMAL LOW (ref >60)
Glucose, Bld: 98 mg/dL (ref 70–99)
Potassium: 4.4 mmol/L (ref 3.5–5.1)
Sodium: 142 mmol/L (ref 135–145)
Total Bilirubin: 0.5 mg/dL (ref 0.3–1.2)
Total Protein: 6.3 g/dL — ABNORMAL LOW (ref 6.5–8.1)

## 2020-04-12 LAB — CEA (IN HOUSE-CHCC): CEA (CHCC-In House): 1.12 ng/mL (ref 0.00–5.00)

## 2020-04-12 MED ORDER — HEPARIN SOD (PORK) LOCK FLUSH 100 UNIT/ML IV SOLN
500.0000 [IU] | Freq: Once | INTRAVENOUS | Status: AC | PRN
  Administered 2020-04-12: 500 [IU]
  Filled 2020-04-12: qty 5

## 2020-04-12 MED ORDER — PERTUZUMAB CHEMO INJECTION 420 MG/14ML
420.0000 mg | Freq: Once | INTRAVENOUS | Status: AC
  Administered 2020-04-12: 420 mg via INTRAVENOUS
  Filled 2020-04-12: qty 14

## 2020-04-12 MED ORDER — TRASTUZUMAB-ANNS CHEMO 420 MG IV SOLR
450.0000 mg | Freq: Once | INTRAVENOUS | Status: AC
  Administered 2020-04-12: 450 mg via INTRAVENOUS
  Filled 2020-04-12: qty 21.43

## 2020-04-12 MED ORDER — ACETAMINOPHEN 325 MG PO TABS
650.0000 mg | ORAL_TABLET | Freq: Once | ORAL | Status: AC
  Administered 2020-04-12: 650 mg via ORAL

## 2020-04-12 MED ORDER — DIPHENHYDRAMINE HCL 25 MG PO CAPS
25.0000 mg | ORAL_CAPSULE | Freq: Once | ORAL | Status: AC
  Administered 2020-04-12: 25 mg via ORAL

## 2020-04-12 MED ORDER — SODIUM CHLORIDE 0.9 % IV SOLN
Freq: Once | INTRAVENOUS | Status: AC
  Filled 2020-04-12: qty 250

## 2020-04-12 MED ORDER — SODIUM CHLORIDE 0.9% FLUSH
10.0000 mL | INTRAVENOUS | Status: DC | PRN
  Administered 2020-04-12: 10 mL
  Filled 2020-04-12: qty 10

## 2020-04-12 MED ORDER — DIPHENHYDRAMINE HCL 25 MG PO CAPS
ORAL_CAPSULE | ORAL | Status: AC
  Filled 2020-04-12: qty 1

## 2020-04-12 MED ORDER — ACETAMINOPHEN 325 MG PO TABS
ORAL_TABLET | ORAL | Status: AC
  Filled 2020-04-12: qty 2

## 2020-04-12 MED ORDER — SODIUM CHLORIDE 0.9% FLUSH
10.0000 mL | INTRAVENOUS | Status: DC | PRN
  Administered 2020-04-12: 10 mL via INTRAVENOUS
  Filled 2020-04-12: qty 10

## 2020-04-12 NOTE — Patient Instructions (Signed)
[facility] Discharge Instructions for Patients Receiving Chemotherapy  Today you received the following chemotherapy agents Traztuzumab(Herceptin), Pertuzumab(Perjeta).  To help prevent nausea and vomiting after your treatment, we encourage you to take your nausea medication as directed.  If you develop nausea and vomiting that is not controlled by your nausea medication, call the clinic.   BELOW ARE SYMPTOMS THAT SHOULD BE REPORTED IMMEDIATELY:  *FEVER GREATER THAN 100.5 F  *CHILLS WITH OR WITHOUT FEVER  NAUSEA AND VOMITING THAT IS NOT CONTROLLED WITH YOUR NAUSEA MEDICATION  *UNUSUAL SHORTNESS OF BREATH  *UNUSUAL BRUISING OR BLEEDING  TENDERNESS IN MOUTH AND THROAT WITH OR WITHOUT PRESENCE OF ULCERS  *URINARY PROBLEMS  *BOWEL PROBLEMS  UNUSUAL RASH Items with * indicate a potential emergency and should be followed up as soon as possible.  Feel free to call the clinic should you have any questions or concerns. The clinic phone number is [phone].  Please show the [facility] at check-in to the Emergency Department and triage nurse.

## 2020-04-12 NOTE — Progress Notes (Signed)
Pt declined to stay 30 min after perjeta infusion, pt discharged stable and ambulatory to lobby with cane.

## 2020-04-12 NOTE — Patient Instructions (Signed)
Implanted Port Insertion, Care After This sheet gives you information about how to care for yourself after your procedure. Your health care provider may also give you more specific instructions. If you have problems or questions, contact your health care provider. What can I expect after the procedure? After the procedure, it is common to have:  Discomfort at the port insertion site.  Bruising on the skin over the port. This should improve over 3-4 days. Follow these instructions at home: Port care  After your port is placed, you will get a manufacturer's information card. The card has information about your port. Keep this card with you at all times.  Take care of the port as told by your health care provider. Ask your health care provider if you or a family member can get training for taking care of the port at home. A home health care nurse may also take care of the port.  Make sure to remember what type of port you have. Incision care  Follow instructions from your health care provider about how to take care of your port insertion site. Make sure you: ? Wash your hands with soap and water before and after you change your bandage (dressing). If soap and water are not available, use hand sanitizer. ? Change your dressing as told by your health care provider. ? Leave stitches (sutures), skin glue, or adhesive strips in place. These skin closures may need to stay in place for 2 weeks or longer. If adhesive strip edges start to loosen and curl up, you may trim the loose edges. Do not remove adhesive strips completely unless your health care provider tells you to do that.  Check your port insertion site every day for signs of infection. Check for: ? Redness, swelling, or pain. ? Fluid or blood. ? Warmth. ? Pus or a bad smell.      Activity  Return to your normal activities as told by your health care provider. Ask your health care provider what activities are safe for you.  Do not  lift anything that is heavier than 10 lb (4.5 kg), or the limit that you are told, until your health care provider says that it is safe. General instructions  Take over-the-counter and prescription medicines only as told by your health care provider.  Do not take baths, swim, or use a hot tub until your health care provider approves. Ask your health care provider if you may take showers. You may only be allowed to take sponge baths.  Do not drive for 24 hours if you were given a sedative during your procedure.  Wear a medical alert bracelet in case of an emergency. This will tell any health care providers that you have a port.  Keep all follow-up visits as told by your health care provider. This is important. Contact a health care provider if:  You cannot flush your port with saline as directed, or you cannot draw blood from the port.  You have a fever or chills.  You have redness, swelling, or pain around your port insertion site.  You have fluid or blood coming from your port insertion site.  Your port insertion site feels warm to the touch.  You have pus or a bad smell coming from the port insertion site. Get help right away if:  You have chest pain or shortness of breath.  You have bleeding from your port that you cannot control. Summary  Take care of the port as told by your   health care provider. Keep the manufacturer's information card with you at all times.  Change your dressing as told by your health care provider.  Contact a health care provider if you have a fever or chills or if you have redness, swelling, or pain around your port insertion site.  Keep all follow-up visits as told by your health care provider. This information is not intended to replace advice given to you by your health care provider. Make sure you discuss any questions you have with your health care provider. Document Revised: 08/05/2017 Document Reviewed: 08/05/2017 Elsevier Patient Education   2021 [employer]  

## 2020-04-13 DIAGNOSIS — M62561 Muscle wasting and atrophy, not elsewhere classified, right lower leg: Secondary | ICD-10-CM | POA: Diagnosis not present

## 2020-04-13 DIAGNOSIS — R269 Unspecified abnormalities of gait and mobility: Secondary | ICD-10-CM | POA: Diagnosis not present

## 2020-04-13 DIAGNOSIS — R296 Repeated falls: Secondary | ICD-10-CM | POA: Diagnosis not present

## 2020-04-13 DIAGNOSIS — M62562 Muscle wasting and atrophy, not elsewhere classified, left lower leg: Secondary | ICD-10-CM | POA: Diagnosis not present

## 2020-04-13 LAB — PTH, INTACT AND CALCIUM
Calcium, Total (PTH): 9 mg/dL (ref 8.7–10.3)
PTH: 57 pg/mL (ref 15–65)

## 2020-04-17 DIAGNOSIS — R296 Repeated falls: Secondary | ICD-10-CM | POA: Diagnosis not present

## 2020-04-17 DIAGNOSIS — M62561 Muscle wasting and atrophy, not elsewhere classified, right lower leg: Secondary | ICD-10-CM | POA: Diagnosis not present

## 2020-04-17 DIAGNOSIS — R269 Unspecified abnormalities of gait and mobility: Secondary | ICD-10-CM | POA: Diagnosis not present

## 2020-04-17 DIAGNOSIS — M62562 Muscle wasting and atrophy, not elsewhere classified, left lower leg: Secondary | ICD-10-CM | POA: Diagnosis not present

## 2020-04-19 DIAGNOSIS — M62562 Muscle wasting and atrophy, not elsewhere classified, left lower leg: Secondary | ICD-10-CM | POA: Diagnosis not present

## 2020-04-19 DIAGNOSIS — M62561 Muscle wasting and atrophy, not elsewhere classified, right lower leg: Secondary | ICD-10-CM | POA: Diagnosis not present

## 2020-04-19 DIAGNOSIS — R269 Unspecified abnormalities of gait and mobility: Secondary | ICD-10-CM | POA: Diagnosis not present

## 2020-04-19 DIAGNOSIS — R296 Repeated falls: Secondary | ICD-10-CM | POA: Diagnosis not present

## 2020-04-21 DIAGNOSIS — H35371 Puckering of macula, right eye: Secondary | ICD-10-CM | POA: Diagnosis not present

## 2020-04-21 DIAGNOSIS — E113513 Type 2 diabetes mellitus with proliferative diabetic retinopathy with macular edema, bilateral: Secondary | ICD-10-CM | POA: Diagnosis not present

## 2020-04-21 DIAGNOSIS — H4311 Vitreous hemorrhage, right eye: Secondary | ICD-10-CM | POA: Diagnosis not present

## 2020-04-21 DIAGNOSIS — H31092 Other chorioretinal scars, left eye: Secondary | ICD-10-CM | POA: Diagnosis not present

## 2020-04-24 ENCOUNTER — Encounter (INDEPENDENT_AMBULATORY_CARE_PROVIDER_SITE_OTHER): Payer: Self-pay | Admitting: Family Medicine

## 2020-04-24 ENCOUNTER — Ambulatory Visit (INDEPENDENT_AMBULATORY_CARE_PROVIDER_SITE_OTHER): Payer: Medicare PPO | Admitting: Bariatrics

## 2020-04-25 DIAGNOSIS — M62561 Muscle wasting and atrophy, not elsewhere classified, right lower leg: Secondary | ICD-10-CM | POA: Diagnosis not present

## 2020-04-25 DIAGNOSIS — R269 Unspecified abnormalities of gait and mobility: Secondary | ICD-10-CM | POA: Diagnosis not present

## 2020-04-25 DIAGNOSIS — M62562 Muscle wasting and atrophy, not elsewhere classified, left lower leg: Secondary | ICD-10-CM | POA: Diagnosis not present

## 2020-04-25 DIAGNOSIS — R296 Repeated falls: Secondary | ICD-10-CM | POA: Diagnosis not present

## 2020-04-27 DIAGNOSIS — H26491 Other secondary cataract, right eye: Secondary | ICD-10-CM | POA: Diagnosis not present

## 2020-04-27 DIAGNOSIS — H35381 Toxic maculopathy, right eye: Secondary | ICD-10-CM | POA: Diagnosis not present

## 2020-04-27 DIAGNOSIS — E113511 Type 2 diabetes mellitus with proliferative diabetic retinopathy with macular edema, right eye: Secondary | ICD-10-CM | POA: Diagnosis not present

## 2020-04-27 DIAGNOSIS — H3581 Retinal edema: Secondary | ICD-10-CM | POA: Diagnosis not present

## 2020-04-27 DIAGNOSIS — H35371 Puckering of macula, right eye: Secondary | ICD-10-CM | POA: Diagnosis not present

## 2020-05-02 ENCOUNTER — Encounter (INDEPENDENT_AMBULATORY_CARE_PROVIDER_SITE_OTHER): Payer: Self-pay | Admitting: Family Medicine

## 2020-05-03 NOTE — Telephone Encounter (Signed)
Dr.Beasley 

## 2020-05-03 NOTE — Telephone Encounter (Signed)
ok 

## 2020-05-04 ENCOUNTER — Other Ambulatory Visit (INDEPENDENT_AMBULATORY_CARE_PROVIDER_SITE_OTHER): Payer: Self-pay

## 2020-05-04 ENCOUNTER — Ambulatory Visit (HOSPITAL_COMMUNITY): Admission: RE | Admit: 2020-05-04 | Payer: Medicare PPO | Source: Ambulatory Visit

## 2020-05-04 DIAGNOSIS — E1122 Type 2 diabetes mellitus with diabetic chronic kidney disease: Secondary | ICD-10-CM

## 2020-05-04 DIAGNOSIS — N184 Chronic kidney disease, stage 4 (severe): Secondary | ICD-10-CM

## 2020-05-07 ENCOUNTER — Other Ambulatory Visit: Payer: Self-pay

## 2020-05-07 ENCOUNTER — Ambulatory Visit (HOSPITAL_COMMUNITY)
Admission: RE | Admit: 2020-05-07 | Discharge: 2020-05-07 | Disposition: A | Discharge status: Home / Self Care | Payer: Medicare PPO | Source: Ambulatory Visit | Attending: Oncology | Admitting: Oncology

## 2020-05-07 DIAGNOSIS — E119 Type 2 diabetes mellitus without complications: Secondary | ICD-10-CM | POA: Diagnosis not present

## 2020-05-07 DIAGNOSIS — C50919 Malignant neoplasm of unspecified site of unspecified female breast: Secondary | ICD-10-CM | POA: Diagnosis not present

## 2020-05-07 DIAGNOSIS — C801 Malignant (primary) neoplasm, unspecified: Secondary | ICD-10-CM

## 2020-05-07 DIAGNOSIS — C50411 Malignant neoplasm of upper-outer quadrant of right female breast: Secondary | ICD-10-CM | POA: Insufficient documentation

## 2020-05-07 DIAGNOSIS — C787 Secondary malignant neoplasm of liver and intrahepatic bile duct: Secondary | ICD-10-CM | POA: Diagnosis not present

## 2020-05-07 DIAGNOSIS — K8020 Calculus of gallbladder without cholecystitis without obstruction: Secondary | ICD-10-CM | POA: Diagnosis not present

## 2020-05-07 DIAGNOSIS — Z7189 Other specified counseling: Secondary | ICD-10-CM | POA: Diagnosis not present

## 2020-05-07 DIAGNOSIS — C50911 Malignant neoplasm of unspecified site of right female breast: Secondary | ICD-10-CM

## 2020-05-07 DIAGNOSIS — K449 Diaphragmatic hernia without obstruction or gangrene: Secondary | ICD-10-CM | POA: Diagnosis not present

## 2020-05-07 DIAGNOSIS — E278 Other specified disorders of adrenal gland: Secondary | ICD-10-CM | POA: Diagnosis not present

## 2020-05-07 MED ORDER — GADOBUTROL 1 MMOL/ML IV SOLN
7.0000 mL | Freq: Once | INTRAVENOUS | Status: AC | PRN
  Administered 2020-05-07: 7 mL via INTRAVENOUS

## 2020-05-08 DIAGNOSIS — M62562 Muscle wasting and atrophy, not elsewhere classified, left lower leg: Secondary | ICD-10-CM | POA: Diagnosis not present

## 2020-05-08 DIAGNOSIS — M62561 Muscle wasting and atrophy, not elsewhere classified, right lower leg: Secondary | ICD-10-CM | POA: Diagnosis not present

## 2020-05-08 DIAGNOSIS — R269 Unspecified abnormalities of gait and mobility: Secondary | ICD-10-CM | POA: Diagnosis not present

## 2020-05-08 DIAGNOSIS — R296 Repeated falls: Secondary | ICD-10-CM | POA: Diagnosis not present

## 2020-05-09 NOTE — Progress Notes (Signed)
[facility]  [phone] [phone]    ID: ATALIA [name] DOB: 01/05/1951  MR#: [id number]  PJP#:[id number]  Patient Care Team: Vernie Shanks, MD as PCP - General (Family Medicine) Larey Dresser, MD as PCP - Advanced Heart Failure (Cardiology) Stark Klein, MD as Consulting Physician (General Surgery) Alaria Oconnor, Virgie Dad, MD as Consulting Physician (Oncology) Jacelyn Pi, MD as Consulting Physician (Endocrinology) [name], MD as Consulting Physician (Plastic Surgery) Gery Pray, MD as Consulting Physician (Radiation Oncology) Juanita Craver, MD as Consulting Physician (Gastroenterology) Alda Berthold, DO as Consulting Physician (Neurology) Starlyn Skeans, MD as Consulting Physician (Family Medicine) Sherlynn Stalls, MD as Consulting Physician (Ophthalmology) Salvadore Dom, MD as Consulting Physician (Obstetrics and Gynecology)  OTHER MD:   CHIEF COMPLAINT:  HER-2 positive, estrogen receptor negative breast cancer  CURRENT TREATMENT:  Trastuzumab; Pertuzumab   INTERVAL HISTORY:  Darlene [name] returns today for follow-up and treatment of her estrogen receptor negative but HER-2 amplified breast cancer.    Since her last visit, she underwent liver MRI on 05/07/2020 showing: tiny area of signal abnormality in right hepatic lobe, dome of right hemi liver not seen on current study, very subtle area suggested but not definitive on current exam, seen only on T2 on prior imaging; no new or progressive findings; gallbladder sludge and stones layering dependently without pericholecystic stranding; stable appearance of mildly prominent portacaval lymph nodes.  She continues on Trastuzumab/Pertuzumab every 4 weeks.  She is tolerating this well, specifically with no diarrhea problems.  She underwent repeat echocardiogram on 03/09/2020 showing an ejection fraction of 55-60%. This is stable from prior in 10/2019.  She also underwent lumbar  spine MRI on 02/20/2020 showing: advanced degenerative disc disease at L4-5; small right foraminal to extraforaminal disc protrusion at L3-4; no evidence for metastatic disease within lumbar spine.   REVIEW OF SYSTEMS: Darlene [name] is getting laser treatments to her right eye currently because of retinal problems related to her diabetes.  She still has a little bit of blurred vision.  She continues to diet and exercise and has much improved weight at present.  Her husband is done with the prostate cancer radiation treatments.  This is a relief to her.  A detailed review of systems today was otherwise stable.   COVID 19 VACCINATION STATUS: Status post [employer] x2, with booster September 2021  BREAST CANCER HISTORY: From the original intake note:  "Darlene [name]" had screening mammography at her gynecologist suggestive of a possible abnormality in the right breast. However right diagnostic mammogram 04/12/2013 at [facility] was negative. More recently however, she noted a lump in her right breast and brought it to her gynecologist's attention. On 07/18/2014 the patient had bilateral diagnostic mammography with tomosynthesis at [facility]. The breast density was category B. In the right breast upper outer quadrant there was an area of focal asymmetry with indistinct margins.[name] Ultrasound was obtained and showed in addition to the mass in question, measuring 1.7 cm, an abnormal-appearing lymph node in the right axillary tail.  On 07/19/2014 the patient underwent biopsy of the right breast massin question as well as a right axillary lymph node. Both were positive for invasive ductal carcinoma, grade 2, estrogen and progesterone receptor negative, with an MIB-1 of 90%, and HER-2 pending. Incidentally the lymph node biopsy showed a lymphocytic inflammatory component but no lymph node architecture.  Her subsequent history is as detailed above.   PAST MEDICAL HISTORY: Past Medical History:  Diagnosis Date  . Anemia  hx of - during  1st round chemo  . Anxiety   . Arthritis    in fingers, shoulders  . Back pain   . Balance problem   . Breast cancer ([place])   . Breast cancer of upper-outer quadrant of right female breast ([place]) 07/22/2014  . Bunion, right foot   . CHF (congestive heart failure) ([place])   . Chronic kidney disease    creatininei levels high per pt   . Clumsiness   . Constipation   . Depression   . Diabetes mellitus without complication ([place])    62+ years type 2   . Diabetic retinopathy ([place])    torn retina  . Eating disorder    binge eating  . Epistaxis 08/26/2014  . Fatty liver   . Foot drop, left foot   . HCAP (healthcare-associated pneumonia) 12/18/2014  . Heart murmur    never had any problems  . History of radiation therapy 04/05/15-05/19/15   right chest wall was treated to 50.4 Gy in 28 fractions, right mastectomy scar was treated to 10 Gy in 5 fractions  . Hyperlipidemia   . Hypertension   . Joint pain   . Multiple food allergies   . Neuromuscular disorder ([place])    diabetic neuropathy  . Obesity   . Obstructive sleep apnea on CPAP    does not use cpap all the time has lost 160 lbs   . Ovarian cyst rupture    possible  . Pneumonia   . Pneumonia 11/2014  . Radiation 04/05/15-05/19/15   right chest wall 50.4 Gy, mastectomy scar 10 Gy  . Sleep apnea   . Thyroid nodule 06/2014    PAST SURGICAL HISTORY: Past Surgical History:  Procedure Laterality Date  . APPENDECTOMY    . BIOPSY THYROID Bilateral 06/2014   2 nodules - Benign   . BREAST LUMPECTOMY WITH RADIOACTIVE SEED AND SENTINEL LYMPH NODE BIOPSY Right 12/27/2014   Procedure: BREAST LUMPECTOMY WITH RADIOACTIVE SEED AND SENTINEL LYMPH NODE BIOPSY;  Surgeon: Stark Klein, MD;  Location: [facility];  Service: General;  Laterality: Right;  . BREAST REDUCTION SURGERY Bilateral 01/06/2015   Procedure: BILATERAL BREAST REDUCTION, RIGHT ONCOPLASTIC RECONSTRUCTION),LEFT BREAST REDUCTION FOR SYMMETRY;  Surgeon: [name],  MD;  Location: [facility];  Service: Plastics;  Laterality: Bilateral;  . BREATH TEK H PYLORI N/A 09/15/2012   Procedure: BREATH TEK H PYLORI;  Surgeon: Pedro Earls, MD;  Location: [facility];  Service: General;  Laterality: N/A;  . BUNIONECTOMY Left 12/2013  . CARPAL TUNNEL RELEASE Right   . CARPAL TUNNEL RELEASE Left   . CATARACT EXTRACTION     6/19  . COLONOSCOPY    . DUPUYTREN CONTRACTURE RELEASE    . GASTRIC ROUX-EN-Y N/A 11/02/2012   Procedure: LAPAROSCOPIC ROUX-EN-Y GASTRIC;  Surgeon: Pedro Earls, MD;  Location: [facility];  Service: General;  Laterality: N/A;  . IR GENERIC HISTORICAL  03/18/2016   IR US GUIDE VASC ACCESS LEFT 03/18/2016 Markus Daft, MD [facility]  . IR GENERIC HISTORICAL  03/18/2016   IR FLUORO GUIDE CV LINE LEFT 03/18/2016 Markus Daft, MD [facility]  . LAPAROSCOPIC APPENDECTOMY N/A 07/22/2015   Procedure: APPENDECTOMY LAPAROSCOPIC;  Surgeon: Michael Boston, MD;  Location[facility];  Service: General;  Laterality: N/A;  . MASTECTOMY Right 02/15/2015   modified  . MODIFIED MASTECTOMY Right 02/15/2015   Procedure: RIGHT MODIFIED RADICAL MASTECTOMY;  Surgeon: Stark Klein, MD;  Location: [facility];  Service: General;  Laterality: Right;  .  ORIF WRIST FRACTURE Left 05/11/2019   Procedure: OPEN REDUCTION INTERNAL FIXATION (ORIF) left distal radius fracture;  Surgeon: Renette Butters, MD;  Location: [facility];  Service: Orthopedics;  Laterality: Left;  with block  . PORT-A-CATH REMOVAL Left 10/10/2015   Procedure: PORT REMOVAL;  Surgeon: Stark Klein, MD;  Location: [facility];  Service: General;  Laterality: Left;  . PORTACATH PLACEMENT Left 08/02/2014   Procedure: INSERTION PORT-A-CATH;  Surgeon: Stark Klein, MD;  Location: [place];  Service: General;  Laterality: Left;  . PORTACATH PLACEMENT N/A 11/05/2016   Procedure: INSERTION PORT-A-CATH;  Surgeon: Stark Klein, MD;  Location: [place];  Service: General;  Laterality: N/A;  . RETINAL LASER PROCEDURE     retina  tear on left eye  . ROTATOR CUFF REPAIR Right 2005  . SCAR REVISION Right 12/08/2015   Procedure: COMPLEX REPAIR RIGHT CHEST 10-15CM;  Surgeon: [name], MD;  Location: [facility];  Service: Plastics;  Laterality: Right;  . TOE SURGERY  1970s   bone spur  . TRIGGER FINGER RELEASE     x3    FAMILY HISTORY Family History  Problem Relation Age of Onset  . CVA Mother   . Heart disease Mother   . Sudden death Mother   . Diabetes Father   . Heart failure Father   . Hypertension Father   . Kidney disease Father   . Obesity Father   . Hypertension Sister   . Diabetes Brother   . Breast cancer Paternal Grandmother 31  . Diabetes Paternal Uncle   . Ovarian cancer Neg Hx   The patient's father died from complications of diabetes at age 59. The patient's mother died at age 54 with a subarachnoid hemorrhage. The patient had one brother, one sister. The only breast cancer was the patient's paternal grandmother diagnosed in her 69s. There is no history of ovarian cancer in the family.   GYNECOLOGIC HISTORY:  Patient's last menstrual period was 10/22/2007.  menarche age 55, the patient is GX P0. She went through the change of life in 2011. She did not take hormone replacement.   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 Her husband [name] is a retired Visual merchandiser (he taught at [employer]). He was diagnosed with prostate cancer in 2021. In May 2020 they moved to [facility] in [place].  [name] has 2 children from an earlier marriage, Rockey Situ who teaches in [place], and Bethanny Toelle,  who works at the [employer] in in [place]. He has 1 grand son, aged 78. The patient and her husband are not church attenders.   ADVANCED DIRECTIVES:  Not in place   HEALTH MAINTENANCE: Social History    Tobacco Use  . Smoking status: Never Smoker  . Smokeless tobacco: Never Used  Vaping Use  . Vaping Use: Never used  Substance Use Topics  . Alcohol use: Yes    Alcohol/week: 1.0 - 2.0 standard drink    Types: 1 - 2 Standard drinks or equivalent per week    Comment: social  . Drug use: No     Colonoscopy:  May 2016  PAP:  Bone density: 07/01/2016 at [facility] showed normal, with a T score of -0.7  Lipid panel:  Allergies  Allergen Reactions  . Nsaids Other (See Comments)    H/o gastric bypass - avoid  NSAIDs  . Tape Hives, Rash and Other (See Comments)    Adhesives in bandaids    Current Outpatient Medications  Medication Sig Dispense Refill  . Accu-Chek Softclix Lancets lancets 1 each by Other route 4 (four) times daily. Test blood sugars 4 x times daily 200 each 1  . aspirin EC 81 MG tablet Take 1 tablet (81 mg total) by mouth daily. 90 tablet 3  . Blood Glucose Calibration (ACCU-CHEK GUIDE CONTROL) LIQD 1 Bottle by In Vitro route as needed. 1 each 0  . Blood Glucose Monitoring Suppl (ACCU-CHEK GUIDE) w/Device KIT 1 each by Does not apply route 4 (four) times daily. Use to test blood sugars 4x a day 1 kit 0  . buPROPion (WELLBUTRIN SR) 200 MG 12 hr tablet Take one tablet by mouth once daily. 90 tablet 0  . CALCIUM CITRATE-VITAMIN D PO Take 1 tablet by mouth 3 (three) times daily.     . carvedilol (COREG) 6.25 MG tablet Take 1 tablet (6.25 mg total) by mouth 2 (two) times daily. 180 tablet 3  . cholecalciferol (VITAMIN D) 1000 units tablet Take 1,000 Units by mouth daily.    . cholestyramine (QUESTRAN) 4 g packet Take 1 packet (4 g total) by mouth 2 (two) times daily as needed. 10 each 12  . Cyanocobalamin (VITAMIN B-12 PO) Take 1 tablet by mouth every 14 (fourteen) days.    . dapagliflozin propanediol (FARXIGA) 10 MG TABS tablet Take 1 tablet (10 mg total) by mouth daily before breakfast. 30 tablet 11  . diphenhydrAMINE (BENADRYL) 25 MG tablet Take 25 mg by mouth daily as  needed for allergies or sleep.     [name] glucose blood (ACCU-CHEK GUIDE) test strip CHECK BLOOD SUGAR 4 TIMES A DAY. 400 each 0  . insulin glargine (LANTUS SOLOSTAR) 100 UNIT/ML Solostar Pen Inject 8 Units into the skin daily. 7 mL 3  . Insulin Pen Needle (SURE COMFORT PEN NEEDLES) 32G X 4 MM MISC Use as instructed to inject insulin and Victoza daily. 200 each 0  . liraglutide (VICTOZA) 18 MG/3ML SOPN Pt to take 1.8 mg qam 27 mL 0  . loratadine (CLARITIN) 10 MG tablet Take 10 mg by mouth as needed for allergies.    [name] losartan (COZAAR) 100 MG tablet Take 1 tablet (100 mg total) by mouth daily. 90 tablet 0  . NEXLIZET 180-10 MG TABS TAKE 1 TABLET BY MOUTH DAILY. 30 tablet 3  . ondansetron (ZOFRAN) 4 MG tablet Take 1 tablet (4 mg total) by mouth every 8 (eight) hours as needed for nausea or vomiting. 20 tablet 0  . Pediatric Multivitamins-Iron (FLINTSTONES PLUS IRON) chewable tablet Chew 1 tablet by mouth 2 (two) times daily.    . Probiotic Product (CVS PROBIOTIC PO) Take 1 capsule by mouth daily.     . prochlorperazine (COMPAZINE) 10 MG tablet Take 1 tablet (10 mg total) by mouth every 6 (six) hours as needed for nausea or vomiting. 30 tablet 0  . Propylene Glycol (SYSTANE BALANCE OP) Place 1-2 drops into both eyes 3 (three) times daily as needed (for dry eyes).     [name] senna-docusate (SENOKOT-S) 8.6-50 MG tablet Take 1 tablet by mouth daily as needed for moderate constipation.     . topiramate (TOPAMAX) 25 MG tablet Take 1 tablet (25 mg total) by mouth daily. 90 tablet 0  . vortioxetine HBr (TRINTELLIX) 20 MG TABS tablet Take 1 tablet (20 mg total) by mouth at bedtime. 90 tablet 0   No  current facility-administered medications for this visit.   Facility-Administered Medications Ordered in Other Visits  Medication Dose Route Frequency Provider Last Rate Last Admin  . sodium chloride flush (NS) 0.9 % injection 10 mL  10 mL Intracatheter PRN Josian Lanese, Virgie Dad, MD   10 mL at 05/10/20 1435    OBJECTIVE:  White woman who appears stated age  8:   05/10/20 1148  BP: (!) 121/52  Pulse: (!) 56  Resp: 20  Temp: 98.1 F (36.7 C)  SpO2: 100%     Body mass index is 26.08 kg/m.     ECOG FS:1 - Symptomatic but completely ambulatory   Filed Weights   05/10/20 1148  Weight: 176 lb 9.6 oz (80.1 kg)     Sclerae unicteric, EOMs intact Wearing a mask No cervical or supraclavicular adenopathy Lungs no rales or rhonchi Heart regular rate and rhythm Abd soft, nontender, positive bowel sounds MSK no focal spinal tenderness, no upper extremity lymphedema Neuro: nonfocal, well oriented, appropriate affect Breasts: Status post right mastectomy.  There is no evidence of local recurrence.  The left breast is benign.  Both axillae are benign.   LAB RESULTS:  CMP     Component Value Date/Time   NA 141 05/10/2020 1126   NA 145 (H) 01/04/2020 0951   NA 140 01/07/2017 0758   K 4.5 05/10/2020 1126   K 4.1 01/07/2017 0758   CL 111 05/10/2020 1126   CO2 24 05/10/2020 1126   CO2 24 01/07/2017 0758   GLUCOSE 103 (H) 05/10/2020 1126   GLUCOSE 134 01/07/2017 0758   BUN 46 (H) 05/10/2020 1126   BUN 33 (H) 01/04/2020 0951   BUN 33.6 (H) 01/07/2017 0758   CREATININE 2.00 (H) 05/10/2020 1126   CREATININE 1.62 (H) 04/28/2018 0915   CREATININE 1.5 (H) 01/07/2017 0758   CALCIUM 8.8 (L) 05/10/2020 1126   CALCIUM 9.0 04/12/2020 0923   CALCIUM 9.3 01/07/2017 0758   PROT 6.1 (L) 05/10/2020 1126   PROT 6.3 01/04/2020 0951   PROT 6.7 01/07/2017 0758   ALBUMIN 3.8 05/10/2020 1126   ALBUMIN 4.3 01/04/2020 0951   ALBUMIN 3.6 01/07/2017 0758   AST 23 05/10/2020 1126   AST 43 (H) 04/28/2018 0915   AST 51 (H) 01/07/2017 0758   ALT 13 05/10/2020 1126   ALT 61 (H) 04/28/2018 0915   ALT 82 (H) 01/07/2017 0758   ALKPHOS 49 05/10/2020 1126   ALKPHOS 115 01/07/2017 0758   BILITOT 0.4 05/10/2020 1126   BILITOT 0.4 01/04/2020 0951   BILITOT 0.6 04/28/2018 0915   BILITOT 0.72 01/07/2017 0758   GFRNONAA  27 (L) 05/10/2020 1126   GFRNONAA 33 (L) 04/28/2018 0915   GFRNONAA 55 (L) 02/18/2013 0827   GFRAA 36 (L) 01/04/2020 0951   GFRAA 38 (L) 04/28/2018 0915   GFRAA 63 02/18/2013 0827    INo results found for: SPEP, UPEP  Lab Results  Component Value Date   WBC 4.3 05/10/2020   NEUTROABS 2.9 05/10/2020   HGB 9.7 (L) 05/10/2020   HCT 28.5 (L) 05/10/2020   MCV 95.6 05/10/2020   PLT 176 05/10/2020      Chemistry      Component Value Date/Time   NA 141 05/10/2020 1126   NA 145 (H) 01/04/2020 0951   NA 140 01/07/2017 0758   K 4.5 05/10/2020 1126   K 4.1 01/07/2017 0758   CL 111 05/10/2020 1126   CO2 24 05/10/2020 1126   CO2 24 01/07/2017 0758  BUN 46 (H) 05/10/2020 1126   BUN 33 (H) 01/04/2020 0951   BUN 33.6 (H) 01/07/2017 0758   CREATININE 2.00 (H) 05/10/2020 1126   CREATININE 1.62 (H) 04/28/2018 0915   CREATININE 1.5 (H) 01/07/2017 0758      Component Value Date/Time   CALCIUM 8.8 (L) 05/10/2020 1126   CALCIUM 9.0 04/12/2020 0923   CALCIUM 9.3 01/07/2017 0758   ALKPHOS 49 05/10/2020 1126   ALKPHOS 115 01/07/2017 0758   AST 23 05/10/2020 1126   AST 43 (H) 04/28/2018 0915   AST 51 (H) 01/07/2017 0758   ALT 13 05/10/2020 1126   ALT 61 (H) 04/28/2018 0915   ALT 82 (H) 01/07/2017 0758   BILITOT 0.4 05/10/2020 1126   BILITOT 0.4 01/04/2020 0951   BILITOT 0.6 04/28/2018 0915   BILITOT 0.72 01/07/2017 0758       No results found for: LABCA2  No components found for: [id number]  No results for input(s): INR in the last 168 hours.  Urinalysis    Component Value Date/Time   COLORURINE YELLOW 07/21/2015 [place] 07/21/2015 2328   LABSPEC 1.020 07/21/2015 2328   PHURINE 6.0 07/21/2015 2328   GLUCOSEU NEGATIVE 07/21/2015 2328   HGBUR TRACE (A) 07/21/2015 2328   BILIRUBINUR NEGATIVE 07/21/2015 2328   BILIRUBINUR neg 06/29/2013 [place] 07/21/2015 2328   PROTEINUR 30 (A) 07/21/2015 2328   UROBILINOGEN negative 06/29/2013 1017    NITRITE NEGATIVE 07/21/2015 2328   LEUKOCYTESUR NEGATIVE 07/21/2015 2328    STUDIES: MR LIVER W WO CONTRAST  Result Date: 05/08/2020 CLINICAL DATA:  Breast cancer staging, follow-up EXAM: MRI ABDOMEN WITHOUT AND WITH CONTRAST TECHNIQUE: Multiplanar multisequence MR imaging of the abdomen was performed both before and after the administration of intravenous contrast. CONTRAST:  28mL GADAVIST GADOBUTROL 1 MMOL/ML IV SOLN COMPARISON:  September 20, 2019 FINDINGS: Lower chest: Incidental imaging of the lung bases is unremarkable. Hepatobiliary: Tiny area of signal abnormality in the RIGHT hepatic lobe, dome of the RIGHT hemi liver is not seen on the current study, very subtle area suggested but not definitive on the current exam, seen only on T2 on prior imaging. Sludge layers dependently in the gallbladder. No pericholecystic stranding. No biliary duct dilation. Portal vein is patent. Hepatic veins are patent. No new hepatic lesions. Pancreas:  Normal, without mass, inflammation or ductal dilatation. Spleen:  Spleen normal size and contour. Adrenals/Urinary Tract: Adrenal glands with mild thickening of the LEFT adrenal gland showing a benign appearance. RIGHT adrenal gland is normal. Symmetric renal enhancement. No hydronephrosis. No suspicious renal lesion. Stomach/Bowel: Post gastric bypass. Small hiatal hernia. No acute bowel abnormality to the extent evaluated on MRI of the abdomen. Vascular/Lymphatic: Patent abdominal vasculature. No abdominal lymphadenopathy. No aneurysmal dilation of the abdominal aorta. Other:  None. Musculoskeletal: No suspicious bone lesions identified. IMPRESSION: 1. Tiny area of signal abnormality in the RIGHT hepatic lobe, dome of the RIGHT hemi liver is not seen on the current study, very subtle area suggested but not definitive on the current exam, seen only on T2 on prior imaging. No new or progressive findings. 2. Gallbladder sludge and stones layering dependently without  pericholecystic stranding. 3. Post gastric bypass. 4. Stable appearance of mildly prominent portacaval lymph nodes. Electronically Signed   By: Zetta Bills M.D.   On: 05/08/2020 09:20     ASSESSMENT: 70 y.o. [place] woman status post right breast upper outer quadrant and right axillary lymph node biopsy 07/19/2014 for a cT2  pN1, stage IIB  invasive ductal carcinoma, grade 2, estrogen and progesterone receptor negative, with an MIB-1 of 90%, and HER-2 amplified  (1) neoadjuvant chemotherapy started 08/08/2014, consisting of carboplatin, docetaxel, trastuzumab and pertuzumab given every 3 weeks 6, completed 11/21/2014  (a) breast MRI after initial 3 cycles shows a significant response  (b) breast MRI after 6 cycles shows a complete radiologic response   (2) trastuzumab continued through 10/02/2015 (to end of capecitabine therapy)  (a) echo 02/13/2015 shows an EF of 60-65%  (b)  echo 05/09/2015 shows an ejection fraction of 60-65%  (c) echocardiogram 06/27/2015 shows an ejection fraction of 55-60%.  (d) echocardiogram 01/22/2017 shows an ejection fraction of 55-60%  (3) right lumpectomy with sentinel lymph node biopsy on 12/27/14 showed a residual  ypT2 ypN1a, stage IIB invasive ductal carcinoma, grade 2, with repeat estrogen and progesterone receptors again negative  (4) status post right oncoplastic surgery (with left reduction mammoplasty) 01/06/2015 showing in the right breast multiple foci of intralymphatic carcinoma in random sections  (5)  Right modified radical mastectomy 02/15/2014 showed no residual invasive disease; there was DCIS but margins were negative; all 15 axillary lymph nodes were clear; ypTis ypN0  (a)  The patient has decided against reconstruction.  (6)  Postmastectomy radiation completed 05/18/2015 with capecitabine sensitization  (7) adjuvant capecitabine continued through 09/27/2015  (8) hepatic steatosis with increased fibrosis score (at risk for  cirrhosis)  (9) thyroid biopsy (left lobe inferiorly) 2 shows benign tissue 06/28/2015  (10) LOCAL RECURRENCE:  (a) PET scan 12/06/2015 Notes a right retropectoral lymph node measuring 0.9 cm with an SUV max of 5.5  (b) repeat PET scan 03/11/2016 finds the right subpectoral lymph node to now measure 1.3 cm with an SUV max of 11.8.  (c) PET scan 05/07/2016 shows a significant response to T-DM 1  (d) PET scan 07/10/2017 is clear except for what appears to be a single new liver lesion   (11) started T-DM1 03/21/2016, completed 8 doses 08/13/2016  (a) PET scan 08/19/2016 shows a good response but measurable residual disease  (b) PET scan on 01/16/2017 shows a continuing response  (12) maintenance trastuzumab started 09/03/2016--changed to every 28 days as of 01/28/2017  (a) repeat echocardiogram 07/08/2016 shows an ejection fraction of 60-65%.  (b) echocardiogram on 10/08/2016 demonstrates LVEF of 55-60% (followed by Dr. Haroldine Laws)  (c) echo 01/22/2017 shows an ejection fraction in the 55-60% range  (d) echo 04/24/2017 shows an ejection fraction in the 55-60% range (recommendation for echo every 6 months by Dr. Aundra Dubin)  (e) trastuzumab changed to T-DM 1 after trastuzumab dose 07/15/2017  METASTATIC DISEASE: July 2019 (13)  Radiation to the area of persistent disease in the right upper chest completed 10/30/2016   (14) T-DM 1 resumed 08/12/2017, repeated every 21 days  (a) baseline liver MRI 08/02/2017 shows a 1.4 cm mass, "hot" on PET 07/10/2017  (b) repeat liver MRI 11/18/2017 after 3 cycles of T-DM 1, shows slight enlargement  (c) T-DM 1 discontinued after 09/23/2017 dose  (15) trastuzumab resumed 11/18/2017, to be repeated every 4 weeks,   (a) changed back to every 3 weeks in 03/2018  (b) echo 09/18/2018 shows an ejection fraction in the 50-55% range.  (c) pertuzumab added with 10/06/2018 dose  (d) trastuzumab/pertuzumab changed to every 4 weeks after 01/19/2019 dose  (e) liver MRI  04/27/2019 shows mild sclerosis, no change in right hepatic lobe treated metastases, with no new lesions; prominent porta hepatis nodes were felt likely reactive  (f) abdominal  ultrasound 06/28/2027 shows no focal liver lesions, no ascites  (g) liver MRI 09/20/2019 shows stable disease (0.6 cm).  (16) liver biopsy 11/03/2017 shows adenocarcinoma, most consistent with an upper gastrointestinal primary; this tumor was estrogen receptor and HER-2 negative  (a) GI workup for occult primary ([place]) negative  (b) PET 12/09/2017 shows no obvious primary.  There is a right subpectoral lymph node noted as well  (c) CA 27-29 and CA 19-9 uninformative; CEA 6.72 OCT 2019  (d) negative hepatitis B serologies May 2021    (17) started Abraxane on 12/111/2019, given days 1 and 8 on a 21 day cycle   (a) restaging with MRI liver 02/24/2018 shows a gratifying initial response  (b) PET scan 05/14/2018 shows resolution of the measurable disease in the liver  (c) liver MRI 09/14/2018 shows no change compared to prior PET and MRI.  (d) Abraxane discontinued after 09/01/2018 dose  (18) continues on Pertuzumab and trastuzumab every 4 weeks (see #15 above)   (a) MRI of the abdomen 05/07/2020 shows no measurable disease  PLAN: Darlene [name] will soon be 3 years out from definitive diagnosis of metastatic breast cancer.  At present she has no symptoms related to her disease, no measurable disease, and is tolerating treatment well.  Accordingly the plan is to continue anti-HER2 treatment as before.  She will get treated today and every 28days with labs every treatment.  She is going to see me again in September and at that time we will decide whether to proceed to additional staging before the end of the year.  I have encouraged her to continue her exercise and diet programs which are getting results  She knows to call for any other issue that may develop before the next visit  Total encounter time 25 minutes.Sarajane Jews C.  Tequia Wolman, MD 05/10/20 5:18 PM Medical Oncology and Hematology [facility] [place], [place] [address] Tel. [phone]    Fax. [phone]   I, Wilburn Mylar, am acting as scribe for Dr. Virgie Dad. Jamyla Ard.  I, Lurline Del MD, have reviewed the above documentation for accuracy and completeness, and I agree with the above.   *Total Encounter Time as defined by the Centers for Medicare and Medicaid Services includes, in addition to the face-to-face time of a patient visit (documented in the note above) non-face-to-face time: obtaining and reviewing outside history, ordering and reviewing medications, tests or procedures, care coordination (communications with other health care professionals or caregivers) and documentation in the medical record.

## 2020-05-10 ENCOUNTER — Inpatient Hospital Stay: Payer: Medicare PPO | Attending: Oncology

## 2020-05-10 ENCOUNTER — Other Ambulatory Visit: Payer: Self-pay

## 2020-05-10 ENCOUNTER — Inpatient Hospital Stay: Payer: Medicare PPO

## 2020-05-10 ENCOUNTER — Ambulatory Visit: Payer: Medicare PPO | Admitting: Podiatry

## 2020-05-10 ENCOUNTER — Inpatient Hospital Stay: Payer: Medicare PPO | Admitting: Oncology

## 2020-05-10 VITALS — BP 121/52 | HR 56 | Temp 98.1°F | Resp 20 | Ht 69.0 in | Wt 176.6 lb

## 2020-05-10 DIAGNOSIS — C50811 Malignant neoplasm of overlapping sites of right female breast: Secondary | ICD-10-CM | POA: Diagnosis not present

## 2020-05-10 DIAGNOSIS — C50911 Malignant neoplasm of unspecified site of right female breast: Secondary | ICD-10-CM | POA: Diagnosis not present

## 2020-05-10 DIAGNOSIS — C787 Secondary malignant neoplasm of liver and intrahepatic bile duct: Secondary | ICD-10-CM

## 2020-05-10 DIAGNOSIS — Z171 Estrogen receptor negative status [ER-]: Secondary | ICD-10-CM

## 2020-05-10 DIAGNOSIS — E113513 Type 2 diabetes mellitus with proliferative diabetic retinopathy with macular edema, bilateral: Secondary | ICD-10-CM | POA: Diagnosis not present

## 2020-05-10 DIAGNOSIS — Z95828 Presence of other vascular implants and grafts: Secondary | ICD-10-CM

## 2020-05-10 DIAGNOSIS — C50411 Malignant neoplasm of upper-outer quadrant of right female breast: Secondary | ICD-10-CM | POA: Diagnosis not present

## 2020-05-10 DIAGNOSIS — H35371 Puckering of macula, right eye: Secondary | ICD-10-CM | POA: Diagnosis not present

## 2020-05-10 DIAGNOSIS — Z5112 Encounter for antineoplastic immunotherapy: Secondary | ICD-10-CM | POA: Insufficient documentation

## 2020-05-10 DIAGNOSIS — C801 Malignant (primary) neoplasm, unspecified: Secondary | ICD-10-CM

## 2020-05-10 LAB — CBC WITH DIFFERENTIAL/PLATELET
Abs Immature Granulocytes: 0.02 10*3/uL (ref 0.00–0.07)
Basophils Absolute: 0 10*3/uL (ref 0.0–0.1)
Basophils Relative: 0 %
Eosinophils Absolute: 0.1 10*3/uL (ref 0.0–0.5)
Eosinophils Relative: 2 %
HCT: 28.5 % — ABNORMAL LOW (ref 36.0–46.0)
Hemoglobin: 9.7 g/dL — ABNORMAL LOW (ref 12.0–15.0)
Immature Granulocytes: 1 %
Lymphocytes Relative: 23 %
Lymphs Abs: 1 10*3/uL (ref 0.7–4.0)
MCH: 32.6 pg (ref 26.0–34.0)
MCHC: 34 g/dL (ref 30.0–36.0)
MCV: 95.6 fL (ref 80.0–100.0)
Monocytes Absolute: 0.3 10*3/uL (ref 0.1–1.0)
Monocytes Relative: 7 %
Neutro Abs: 2.9 10*3/uL (ref 1.7–7.7)
Neutrophils Relative %: 67 %
Platelets: 176 10*3/uL (ref 150–400)
RBC: 2.98 MIL/uL — ABNORMAL LOW (ref 3.87–5.11)
RDW: 12.5 % (ref 11.5–15.5)
WBC: 4.3 10*3/uL (ref 4.0–10.5)
nRBC: 0 % (ref 0.0–0.2)

## 2020-05-10 LAB — COMPREHENSIVE METABOLIC PANEL
ALT: 13 U/L (ref 0–44)
AST: 23 U/L (ref 15–41)
Albumin: 3.8 g/dL (ref 3.5–5.0)
Alkaline Phosphatase: 49 U/L (ref 38–126)
Anion gap: 6 (ref 5–15)
BUN: 46 mg/dL — ABNORMAL HIGH (ref 8–23)
CO2: 24 mmol/L (ref 22–32)
Calcium: 8.8 mg/dL — ABNORMAL LOW (ref 8.9–10.3)
Chloride: 111 mmol/L (ref 98–111)
Creatinine, Ser: 2 mg/dL — ABNORMAL HIGH (ref 0.44–1.00)
GFR, Estimated: 27 mL/min — ABNORMAL LOW (ref >60)
Glucose, Bld: 103 mg/dL — ABNORMAL HIGH (ref 70–99)
Potassium: 4.5 mmol/L (ref 3.5–5.1)
Sodium: 141 mmol/L (ref 135–145)
Total Bilirubin: 0.4 mg/dL (ref 0.3–1.2)
Total Protein: 6.1 g/dL — ABNORMAL LOW (ref 6.5–8.1)

## 2020-05-10 LAB — CEA (IN HOUSE-CHCC): CEA (CHCC-In House): 1.81 ng/mL (ref 0.00–5.00)

## 2020-05-10 MED ORDER — ACETAMINOPHEN 325 MG PO TABS
650.0000 mg | ORAL_TABLET | Freq: Once | ORAL | Status: AC
  Administered 2020-05-10: 650 mg via ORAL

## 2020-05-10 MED ORDER — SODIUM CHLORIDE 0.9% FLUSH
10.0000 mL | INTRAVENOUS | Status: DC | PRN
  Administered 2020-05-10: 10 mL via INTRAVENOUS
  Filled 2020-05-10: qty 10

## 2020-05-10 MED ORDER — TRASTUZUMAB-ANNS CHEMO 420 MG IV SOLR
450.0000 mg | Freq: Once | INTRAVENOUS | Status: AC
  Administered 2020-05-10: 450 mg via INTRAVENOUS
  Filled 2020-05-10: qty 21.43

## 2020-05-10 MED ORDER — HEPARIN SOD (PORK) LOCK FLUSH 100 UNIT/ML IV SOLN
500.0000 [IU] | Freq: Once | INTRAVENOUS | Status: AC | PRN
  Administered 2020-05-10: 500 [IU]
  Filled 2020-05-10: qty 5

## 2020-05-10 MED ORDER — DIPHENHYDRAMINE HCL 25 MG PO CAPS
25.0000 mg | ORAL_CAPSULE | Freq: Once | ORAL | Status: AC
  Administered 2020-05-10: 25 mg via ORAL

## 2020-05-10 MED ORDER — SODIUM CHLORIDE 0.9 % IV SOLN
Freq: Once | INTRAVENOUS | Status: AC
Start: 2020-05-10 — End: 2020-05-10
  Filled 2020-05-10: qty 250

## 2020-05-10 MED ORDER — DIPHENHYDRAMINE HCL 25 MG PO CAPS
ORAL_CAPSULE | ORAL | Status: AC
  Filled 2020-05-10: qty 1

## 2020-05-10 MED ORDER — SODIUM CHLORIDE 0.9% FLUSH
10.0000 mL | INTRAVENOUS | Status: DC | PRN
  Administered 2020-05-10: 10 mL
  Filled 2020-05-10: qty 10

## 2020-05-10 MED ORDER — SODIUM CHLORIDE 0.9 % IV SOLN
420.0000 mg | Freq: Once | INTRAVENOUS | Status: AC
  Administered 2020-05-10: 420 mg via INTRAVENOUS
  Filled 2020-05-10: qty 14

## 2020-05-10 MED ORDER — ACETAMINOPHEN 325 MG PO TABS
ORAL_TABLET | ORAL | Status: AC
  Filled 2020-05-10: qty 2

## 2020-05-10 NOTE — Patient Instructions (Signed)
[facility] Discharge Instructions for Patients Receiving Chemotherapy  Today you received the following chemotherapy agents trastuzumab-anns, pertuzumab  To help prevent nausea and vomiting after your treatment, we encourage you to take your nausea medication as directed.   If you develop nausea and vomiting that is not controlled by your nausea medication, call the clinic.   BELOW ARE SYMPTOMS THAT SHOULD BE REPORTED IMMEDIATELY:  *FEVER GREATER THAN 100.5 F  *CHILLS WITH OR WITHOUT FEVER  NAUSEA AND VOMITING THAT IS NOT CONTROLLED WITH YOUR NAUSEA MEDICATION  *UNUSUAL SHORTNESS OF BREATH  *UNUSUAL BRUISING OR BLEEDING  TENDERNESS IN MOUTH AND THROAT WITH OR WITHOUT PRESENCE OF ULCERS  *URINARY PROBLEMS  *BOWEL PROBLEMS  UNUSUAL RASH Items with * indicate a potential emergency and should be followed up as soon as possible.  Feel free to call the clinic should you have any questions or concerns. The clinic phone number is [phone].  Please show the [facility] at check-in to the Emergency Department and triage nurse.

## 2020-05-12 ENCOUNTER — Other Ambulatory Visit: Payer: Self-pay

## 2020-05-12 ENCOUNTER — Ambulatory Visit: Payer: Medicare PPO | Admitting: Podiatry

## 2020-05-12 ENCOUNTER — Encounter: Payer: Self-pay | Admitting: Podiatry

## 2020-05-12 DIAGNOSIS — M79675 Pain in left toe(s): Secondary | ICD-10-CM | POA: Diagnosis not present

## 2020-05-12 DIAGNOSIS — M21372 Foot drop, left foot: Secondary | ICD-10-CM

## 2020-05-12 DIAGNOSIS — M79674 Pain in right toe(s): Secondary | ICD-10-CM

## 2020-05-12 DIAGNOSIS — E1140 Type 2 diabetes mellitus with diabetic neuropathy, unspecified: Secondary | ICD-10-CM | POA: Diagnosis not present

## 2020-05-12 DIAGNOSIS — B351 Tinea unguium: Secondary | ICD-10-CM | POA: Diagnosis not present

## 2020-05-12 DIAGNOSIS — R296 Repeated falls: Secondary | ICD-10-CM | POA: Diagnosis not present

## 2020-05-12 DIAGNOSIS — M62562 Muscle wasting and atrophy, not elsewhere classified, left lower leg: Secondary | ICD-10-CM | POA: Diagnosis not present

## 2020-05-12 DIAGNOSIS — M62561 Muscle wasting and atrophy, not elsewhere classified, right lower leg: Secondary | ICD-10-CM | POA: Diagnosis not present

## 2020-05-12 DIAGNOSIS — R269 Unspecified abnormalities of gait and mobility: Secondary | ICD-10-CM | POA: Diagnosis not present

## 2020-05-12 DIAGNOSIS — E1149 Type 2 diabetes mellitus with other diabetic neurological complication: Secondary | ICD-10-CM

## 2020-05-12 NOTE — Progress Notes (Signed)
This patient returns to my office for at risk foot care.  This patient requires this care by a professional since this patient will be at risk due to having diabetic neuropathy.    This patient is unable to cut nails herself since the patient cannot reach her nails.These nails are painful walking and wearing shoes.  This patient presents for at risk foot care today.  General Appearance  Alert, conversant and in no acute stress.  Vascular  Dorsalis pedis and posterior tibial  pulses are palpable  bilaterally.  Capillary return is within normal limits  bilaterally. Temperature is within normal limits  bilaterally.  Neurologic  Senn-Weinstein monofilament wire test absent  bilaterally. Muscle power within normal limits bilaterally.  Nails Thick disfigured discolored nails with subungual debris  hallux toenails bilaterally.   No evidence of bacterial infection or drainage bilaterally.  Orthopedic  No limitations of motion  feet .  No crepitus or effusions noted.  No bony pathology or digital deformities noted.  HAV.    Hammer toes  Skin  normotropic skin with no porokeratosis noted bilaterally.  No signs of infections or ulcers noted.     Onychomycosis  Pain in right toes  Pain in left toes  Consent was obtained for treatment procedures.   Mechanical debridement of nails 1-5  bilaterally performed with a nail nipper.  Filed with dremel without incident.    Return office visit   10 weeks                  Told patient to return for periodic foot care and evaluation due to potential at risk complications.   Gardiner Barefoot DPM

## 2020-05-16 DIAGNOSIS — R269 Unspecified abnormalities of gait and mobility: Secondary | ICD-10-CM | POA: Diagnosis not present

## 2020-05-16 DIAGNOSIS — R296 Repeated falls: Secondary | ICD-10-CM | POA: Diagnosis not present

## 2020-05-16 DIAGNOSIS — M62561 Muscle wasting and atrophy, not elsewhere classified, right lower leg: Secondary | ICD-10-CM | POA: Diagnosis not present

## 2020-05-16 DIAGNOSIS — M62562 Muscle wasting and atrophy, not elsewhere classified, left lower leg: Secondary | ICD-10-CM | POA: Diagnosis not present

## 2020-05-19 DIAGNOSIS — M62561 Muscle wasting and atrophy, not elsewhere classified, right lower leg: Secondary | ICD-10-CM | POA: Diagnosis not present

## 2020-05-19 DIAGNOSIS — M62562 Muscle wasting and atrophy, not elsewhere classified, left lower leg: Secondary | ICD-10-CM | POA: Diagnosis not present

## 2020-05-19 DIAGNOSIS — R269 Unspecified abnormalities of gait and mobility: Secondary | ICD-10-CM | POA: Diagnosis not present

## 2020-05-19 DIAGNOSIS — R296 Repeated falls: Secondary | ICD-10-CM | POA: Diagnosis not present

## 2020-05-23 DIAGNOSIS — M62561 Muscle wasting and atrophy, not elsewhere classified, right lower leg: Secondary | ICD-10-CM | POA: Diagnosis not present

## 2020-05-23 DIAGNOSIS — M62562 Muscle wasting and atrophy, not elsewhere classified, left lower leg: Secondary | ICD-10-CM | POA: Diagnosis not present

## 2020-05-23 DIAGNOSIS — R269 Unspecified abnormalities of gait and mobility: Secondary | ICD-10-CM | POA: Diagnosis not present

## 2020-05-23 DIAGNOSIS — R296 Repeated falls: Secondary | ICD-10-CM | POA: Diagnosis not present

## 2020-05-26 ENCOUNTER — Ambulatory Visit (INDEPENDENT_AMBULATORY_CARE_PROVIDER_SITE_OTHER): Payer: Medicare PPO | Admitting: Neurology

## 2020-05-26 ENCOUNTER — Encounter: Payer: Self-pay | Admitting: Neurology

## 2020-05-26 ENCOUNTER — Other Ambulatory Visit: Payer: Self-pay

## 2020-05-26 VITALS — BP 101/60 | HR 60 | Ht 68.5 in | Wt 175.0 lb

## 2020-05-26 DIAGNOSIS — T451X5A Adverse effect of antineoplastic and immunosuppressive drugs, initial encounter: Secondary | ICD-10-CM

## 2020-05-26 DIAGNOSIS — G5730 Lesion of lateral popliteal nerve, unspecified lower limb: Secondary | ICD-10-CM | POA: Diagnosis not present

## 2020-05-26 DIAGNOSIS — E1142 Type 2 diabetes mellitus with diabetic polyneuropathy: Secondary | ICD-10-CM

## 2020-05-26 DIAGNOSIS — R296 Repeated falls: Secondary | ICD-10-CM | POA: Diagnosis not present

## 2020-05-26 DIAGNOSIS — G620 Drug-induced polyneuropathy: Secondary | ICD-10-CM | POA: Diagnosis not present

## 2020-05-26 DIAGNOSIS — M62561 Muscle wasting and atrophy, not elsewhere classified, right lower leg: Secondary | ICD-10-CM | POA: Diagnosis not present

## 2020-05-26 DIAGNOSIS — M62562 Muscle wasting and atrophy, not elsewhere classified, left lower leg: Secondary | ICD-10-CM | POA: Diagnosis not present

## 2020-05-26 DIAGNOSIS — R269 Unspecified abnormalities of gait and mobility: Secondary | ICD-10-CM | POA: Diagnosis not present

## 2020-05-26 NOTE — Progress Notes (Signed)
Follow-up Visit   Date: 05/26/20   Darlene [name] MRN: [id number] DOB: 06/11/1950   Interim History: Darlene [name] is a 70 y.o. right-handed Caucasian female with insulin-dependent diabetes mellitus, gastric bypass (2015 weight 130lb weight loss), right breast cancer s/p mastectomy, chemotherapy, and radiation (2016) with liver metastasis in 2017, and hypertension  returning to the clinic for follow-up of neuropathy and bilateral foot drop.  The patient was accompanied to the clinic by self.  History of present illness: She has a long history of diabetic neuropathy since around 2010 manifesting with numbness in the feet and lower legs.  She denies painful paresthesias.  She does have imbalance and walks unassisted, although a cane has been recommended.   She is avid Hospital doctor and takes a hiking stick when she is walking.  No falls.  She lives in a one-level home.  Initially, her diabetes was poorly-controlled with HbA1c ranging in 10-12, however, after her gastric bypass in 2015, she has markedly improved diabetes management.  Her last HbA1c is 5.5 on insulin and metformin.  She lost 130lb with her surgery.  She takes daily multievitamin and B12 injection every 2 weeks. B12 level from August was normal.  Her neuropathy did get worse in 2017 when she received chemotherapy for breast cancer (carboplatin, docetaxel, trastuzumab and pertuzumab given every 3 weeks 6. Trastuzumab + capecitabine). No history of heavy alcohol use.    She developed left foot drop in 2019 which improved with physical therapy.  Further testing was not done.  She underwent right bunionectomy on 12/09/2018.  She was immobilized in a boot for several weeks and did not notice any weakness.  Starting in January 2021, she was cleared to start to wear sneakers and she felt that both feet were weak.  She went to physical therapy and noticed that she has bilateral foot drop, with the left foot asymmetrically weaker.    NCS/EMG of the legs which showed severe sensorimotor polyneuropathy with superimposed bilateral peroneal mononeuropathy at the fibular head. She was doing PT and got a brace, which has helped stabilize her foot.  She has noticed mild improvement, such that she is able to extend the left foot, which was previously completely weak.    UPDATE 05/26/2020:  She is here for follow-up.  Overall, she feels that her foot drop is slightly better, or stable.  No worsening.  She suffered another fall in February and has been doing PT.  She continues to wear a ankle brace in the left foot and walks with a cane. Balance is fair.  At her last visit, she was having increased cramps and with her foot drop, MRI lumbar spine was ordered to evaluate for canal stenosis.  Imaging shows lateral recess stenosis at L4-5 and right disc protrusion at L3-4.     Medications:  Current Outpatient Medications on File Prior to Visit  Medication Sig Dispense Refill  . Accu-Chek Softclix Lancets lancets 1 each by Other route 4 (four) times daily. Test blood sugars 4 x times daily 200 each 1  . aspirin EC 81 MG tablet Take 1 tablet (81 mg total) by mouth daily. 90 tablet 3  . Blood Glucose Calibration (ACCU-CHEK GUIDE CONTROL) LIQD 1 Bottle by In Vitro route as needed. 1 each 0  . Blood Glucose Monitoring Suppl (ACCU-CHEK GUIDE) w/Device KIT 1 each by Does not apply route 4 (four) times daily. Use to test blood sugars 4x a day 1 kit 0  . buPROPion (  WELLBUTRIN SR) 200 MG 12 hr tablet Take one tablet by mouth once daily. 90 tablet 0  . CALCIUM CITRATE-VITAMIN D PO Take 1 tablet by mouth 3 (three) times daily.     . carvedilol (COREG) 6.25 MG tablet Take 1 tablet (6.25 mg total) by mouth 2 (two) times daily. 180 tablet 3  . cholecalciferol (VITAMIN D) 1000 units tablet Take 1,000 Units by mouth daily.    . cholestyramine (QUESTRAN) 4 g packet Take 1 packet (4 g total) by mouth 2 (two) times daily as needed. 10 each 12  . Cyanocobalamin  (VITAMIN B-12 PO) Take 1 tablet by mouth every 14 (fourteen) days.    . dapagliflozin propanediol (FARXIGA) 10 MG TABS tablet Take 1 tablet (10 mg total) by mouth daily before breakfast. 30 tablet 11  . diphenhydrAMINE (BENADRYL) 25 MG tablet Take 25 mg by mouth daily as needed for allergies or sleep.     [name] glucose blood (ACCU-CHEK GUIDE) test strip CHECK BLOOD SUGAR 4 TIMES A DAY. 400 each 0  . insulin glargine (LANTUS SOLOSTAR) 100 UNIT/ML Solostar Pen Inject 8 Units into the skin daily. 7 mL 3  . Insulin Pen Needle (SURE COMFORT PEN NEEDLES) 32G X 4 MM MISC Use as instructed to inject insulin and Victoza daily. 200 each 0  . liraglutide (VICTOZA) 18 MG/3ML SOPN Pt to take 1.8 mg qam 27 mL 0  . loratadine (CLARITIN) 10 MG tablet Take 10 mg by mouth as needed for allergies.    [name] losartan (COZAAR) 100 MG tablet Take 1 tablet (100 mg total) by mouth daily. 90 tablet 0  . NEXLIZET 180-10 MG TABS TAKE 1 TABLET BY MOUTH DAILY. 30 tablet 3  . ondansetron (ZOFRAN) 4 MG tablet Take 1 tablet (4 mg total) by mouth every 8 (eight) hours as needed for nausea or vomiting. 20 tablet 0  . Pediatric Multivitamins-Iron (FLINTSTONES PLUS IRON) chewable tablet Chew 1 tablet by mouth 2 (two) times daily.    . Probiotic Product ([employer]S PROBIOTIC PO) Take 1 capsule by mouth daily.     . prochlorperazine (COMPAZINE) 10 MG tablet Take 1 tablet (10 mg total) by mouth every 6 (six) hours as needed for nausea or vomiting. 30 tablet 0  . Propylene Glycol (SYSTANE BALANCE OP) Place 1-2 drops into both eyes 3 (three) times daily as needed (for dry eyes).     [name] senna-docusate (SENOKOT-S) 8.6-50 MG tablet Take 1 tablet by mouth daily as needed for moderate constipation.     . topiramate (TOPAMAX) 25 MG tablet Take 1 tablet (25 mg total) by mouth daily. 90 tablet 0  . vortioxetine HBr (TRINTELLIX) 20 MG TABS tablet Take 1 tablet (20 mg total) by mouth at bedtime. 90 tablet 0   No current facility-administered medications on file  prior to visit.    Allergies:  Allergies  Allergen Reactions  . Nsaids Other (See Comments)    H/o gastric bypass - avoid NSAIDs  . Tape Hives, Rash and Other (See Comments)    Adhesives in bandaids    Vital Signs:  BP 101/60   Pulse 60   Ht 5' 8.5" (1.74 m)   Wt 175 lb (79.4 kg)   LMP 10/22/2007   SpO2 97%   BMI 26.22 kg/m     Neurological Exam: MENTAL STATUS including orientation to time, place, person, recent and remote memory, attention span and concentration, language, and fund of knowledge is normal.  Speech is not dysarthric.  CRANIAL NERVES:  Normal  conjugate, extra-ocular eye movements in all directions of gaze.  No ptosis.    MOTOR:   MOTOR:  No atrophy, fasciculations or abnormal movements.  No pronator drift.   Upper Extremity:  Right  Left  Deltoid  5/5   5/5   Biceps  5/5   5/5   Triceps  5/5   5/5   Infraspinatus 5/5  5/5  Medial pectoralis 5/5  5/5  Wrist extensors  5/5   5/5   Wrist flexors  5/5   5/5   Finger extensors  5/5   5/5   Finger flexors  5/5   5/5   Dorsal interossei  5/5   5/5   Abductor pollicis  5/5   5/5   Tone (Ashworth scale)  0  0   Lower Extremity:  Right  Left  Hip flexors  5/5   5/5   Hip extensors  5/5   5/5   Adductor 5/5  5/5  Abductor 5/5  5/5  Knee flexors  5/5   5/5   Knee extensors  5/5   5/5   Dorsiflexors  4+/5   3/5   Plantarflexors  5/5   5/5   Toe extensors  5/5   5/5   Toe flexors  5/5   5/5   Tone (Ashworth scale)  0  0    COORDINATION/GAIT:  Unsteady gait, assisted with cane and left ankle brace   Data: NCS/EMG of the legs 03/03/19: 1. The electrophysiologic findings are consistent with a chronic, severe, sensorimotor axonal polyneuropathy affecting the lower extremities. 2. There is evidence of a superimposed common peroneal neuropathy at the fibular neck affecting bilateral lower extremities, findings are severe in degree electrically and worse on the left.  MRI brain wwo contrast 04/27/2019: No  evidence of regional metastatic disease. Moderate chronic small-vessel changes of the pons and cerebral hemispheric white matter.  MRI lumbar spine 02/21/2020: 1. Advanced degenerative disc disease at L4-5 with resultant moderate left greater than right lateral recess stenosis, with moderate left and mild right L4 foraminal narrowing. 2. Small right foraminal to extraforaminal disc protrusion at L3-4, closely approximating and potentially irritating the exiting right L3 nerve root. 3. No evidence for metastatic disease within the lumbar spine.  IMPRESSION/PLAN: 1.  Muscle cramps secondary to lumbar spondylosis and metabolic changes (CKD).  MRI lumbar spine was personally viewed and shows degenerative changes worse at L4-5.  This could contribute to her cramps, no evidence of L5 nerve root involvement  - flexeril declined  - patient will be better about doing home stretching exercises  2.  Sensorimotor polyneuropathy due to diabetes and chemotherapy, stable  -Patient educated on daily foot inspection, fall prevention, and safety precautions around the home.  3.   Bilateral foot drop due to peroneal mononeuropathy at the fibular head, stable, worse on the left (2019)  - Continue to use left AFO  - Recommend using a rollator, as a cane does not seem to provide adequate stability  - Continue PT   Return to clinic in 6 months  Thank you for allowing me to participate in patient's care.  If I can answer any additional questions, I would be pleased to do so.    Sincerely,    Domanique Huesman K. Posey Pronto, DO

## 2020-05-26 NOTE — Patient Instructions (Addendum)
Continue home stretching exercises and therapy  You may try to use a rollator and see if that gives more stability when walking, especially for long distances  Return to clinic in 6 months

## 2020-05-30 DIAGNOSIS — M62562 Muscle wasting and atrophy, not elsewhere classified, left lower leg: Secondary | ICD-10-CM | POA: Diagnosis not present

## 2020-05-30 DIAGNOSIS — R296 Repeated falls: Secondary | ICD-10-CM | POA: Diagnosis not present

## 2020-05-30 DIAGNOSIS — R269 Unspecified abnormalities of gait and mobility: Secondary | ICD-10-CM | POA: Diagnosis not present

## 2020-05-30 DIAGNOSIS — M62561 Muscle wasting and atrophy, not elsewhere classified, right lower leg: Secondary | ICD-10-CM | POA: Diagnosis not present

## 2020-06-02 ENCOUNTER — Ambulatory Visit: Payer: Medicare PPO | Admitting: Neurology

## 2020-06-02 DIAGNOSIS — R296 Repeated falls: Secondary | ICD-10-CM | POA: Diagnosis not present

## 2020-06-02 DIAGNOSIS — M62562 Muscle wasting and atrophy, not elsewhere classified, left lower leg: Secondary | ICD-10-CM | POA: Diagnosis not present

## 2020-06-02 DIAGNOSIS — R269 Unspecified abnormalities of gait and mobility: Secondary | ICD-10-CM | POA: Diagnosis not present

## 2020-06-02 DIAGNOSIS — M62561 Muscle wasting and atrophy, not elsewhere classified, right lower leg: Secondary | ICD-10-CM | POA: Diagnosis not present

## 2020-06-06 DIAGNOSIS — M62561 Muscle wasting and atrophy, not elsewhere classified, right lower leg: Secondary | ICD-10-CM | POA: Diagnosis not present

## 2020-06-06 DIAGNOSIS — R269 Unspecified abnormalities of gait and mobility: Secondary | ICD-10-CM | POA: Diagnosis not present

## 2020-06-06 DIAGNOSIS — M62562 Muscle wasting and atrophy, not elsewhere classified, left lower leg: Secondary | ICD-10-CM | POA: Diagnosis not present

## 2020-06-06 DIAGNOSIS — R296 Repeated falls: Secondary | ICD-10-CM | POA: Diagnosis not present

## 2020-06-07 ENCOUNTER — Inpatient Hospital Stay: Payer: Medicare PPO | Attending: Oncology

## 2020-06-07 ENCOUNTER — Other Ambulatory Visit: Payer: Self-pay

## 2020-06-07 ENCOUNTER — Inpatient Hospital Stay: Payer: Medicare PPO

## 2020-06-07 VITALS — BP 106/70 | HR 54 | Temp 97.4°F | Resp 18

## 2020-06-07 DIAGNOSIS — E113513 Type 2 diabetes mellitus with proliferative diabetic retinopathy with macular edema, bilateral: Secondary | ICD-10-CM | POA: Diagnosis not present

## 2020-06-07 DIAGNOSIS — H35371 Puckering of macula, right eye: Secondary | ICD-10-CM | POA: Diagnosis not present

## 2020-06-07 DIAGNOSIS — C50911 Malignant neoplasm of unspecified site of right female breast: Secondary | ICD-10-CM

## 2020-06-07 DIAGNOSIS — C50411 Malignant neoplasm of upper-outer quadrant of right female breast: Secondary | ICD-10-CM

## 2020-06-07 DIAGNOSIS — C787 Secondary malignant neoplasm of liver and intrahepatic bile duct: Secondary | ICD-10-CM

## 2020-06-07 DIAGNOSIS — C50811 Malignant neoplasm of overlapping sites of right female breast: Secondary | ICD-10-CM | POA: Insufficient documentation

## 2020-06-07 DIAGNOSIS — Z5112 Encounter for antineoplastic immunotherapy: Secondary | ICD-10-CM | POA: Insufficient documentation

## 2020-06-07 DIAGNOSIS — Z95828 Presence of other vascular implants and grafts: Secondary | ICD-10-CM

## 2020-06-07 DIAGNOSIS — C801 Malignant (primary) neoplasm, unspecified: Secondary | ICD-10-CM

## 2020-06-07 DIAGNOSIS — Z171 Estrogen receptor negative status [ER-]: Secondary | ICD-10-CM

## 2020-06-07 LAB — COMPREHENSIVE METABOLIC PANEL
ALT: 13 U/L (ref 0–44)
AST: 23 U/L (ref 15–41)
Albumin: 3.8 g/dL (ref 3.5–5.0)
Alkaline Phosphatase: 48 U/L (ref 38–126)
Anion gap: 8 (ref 5–15)
BUN: 48 mg/dL — ABNORMAL HIGH (ref 8–23)
CO2: 22 mmol/L (ref 22–32)
Calcium: 9.1 mg/dL (ref 8.9–10.3)
Chloride: 111 mmol/L (ref 98–111)
Creatinine, Ser: 2.41 mg/dL — ABNORMAL HIGH (ref 0.44–1.00)
GFR, Estimated: 21 mL/min — ABNORMAL LOW (ref >60)
Glucose, Bld: 146 mg/dL — ABNORMAL HIGH (ref 70–99)
Potassium: 4.1 mmol/L (ref 3.5–5.1)
Sodium: 141 mmol/L (ref 135–145)
Total Bilirubin: 0.5 mg/dL (ref 0.3–1.2)
Total Protein: 6.2 g/dL — ABNORMAL LOW (ref 6.5–8.1)

## 2020-06-07 LAB — CBC WITH DIFFERENTIAL/PLATELET
Abs Immature Granulocytes: 0.02 10*3/uL (ref 0.00–0.07)
Basophils Absolute: 0 10*3/uL (ref 0.0–0.1)
Basophils Relative: 0 %
Eosinophils Absolute: 0.1 10*3/uL (ref 0.0–0.5)
Eosinophils Relative: 3 %
HCT: 31.1 % — ABNORMAL LOW (ref 36.0–46.0)
Hemoglobin: 10.6 g/dL — ABNORMAL LOW (ref 12.0–15.0)
Immature Granulocytes: 1 %
Lymphocytes Relative: 27 %
Lymphs Abs: 1 10*3/uL (ref 0.7–4.0)
MCH: 32.4 pg (ref 26.0–34.0)
MCHC: 34.1 g/dL (ref 30.0–36.0)
MCV: 95.1 fL (ref 80.0–100.0)
Monocytes Absolute: 0.3 10*3/uL (ref 0.1–1.0)
Monocytes Relative: 8 %
Neutro Abs: 2.3 10*3/uL (ref 1.7–7.7)
Neutrophils Relative %: 61 %
Platelets: 157 10*3/uL (ref 150–400)
RBC: 3.27 MIL/uL — ABNORMAL LOW (ref 3.87–5.11)
RDW: 12.4 % (ref 11.5–15.5)
WBC: 3.8 10*3/uL — ABNORMAL LOW (ref 4.0–10.5)
nRBC: 0 % (ref 0.0–0.2)

## 2020-06-07 LAB — CEA (IN HOUSE-CHCC): CEA (CHCC-In House): 3.39 ng/mL (ref 0.00–5.00)

## 2020-06-07 MED ORDER — ACETAMINOPHEN 325 MG PO TABS
650.0000 mg | ORAL_TABLET | Freq: Once | ORAL | Status: AC
Start: 2020-06-07 — End: 2020-06-07
  Administered 2020-06-07: 650 mg via ORAL

## 2020-06-07 MED ORDER — TRASTUZUMAB-ANNS CHEMO 420 MG IV SOLR
450.0000 mg | Freq: Once | INTRAVENOUS | Status: AC
  Administered 2020-06-07: 450 mg via INTRAVENOUS
  Filled 2020-06-07: qty 21.43

## 2020-06-07 MED ORDER — HEPARIN SOD (PORK) LOCK FLUSH 100 UNIT/ML IV SOLN
500.0000 [IU] | Freq: Once | INTRAVENOUS | Status: AC | PRN
Start: 2020-06-07 — End: 2020-06-07
  Administered 2020-06-07: 500 [IU]
  Filled 2020-06-07: qty 5

## 2020-06-07 MED ORDER — SODIUM CHLORIDE 0.9% FLUSH
10.0000 mL | INTRAVENOUS | Status: DC | PRN
  Administered 2020-06-07: 10 mL
  Filled 2020-06-07: qty 10

## 2020-06-07 MED ORDER — SODIUM CHLORIDE 0.9 % IV SOLN
420.0000 mg | Freq: Once | INTRAVENOUS | Status: AC
  Administered 2020-06-07: 420 mg via INTRAVENOUS
  Filled 2020-06-07: qty 14

## 2020-06-07 MED ORDER — DIPHENHYDRAMINE HCL 25 MG PO CAPS
25.0000 mg | ORAL_CAPSULE | Freq: Once | ORAL | Status: AC
  Administered 2020-06-07: 25 mg via ORAL

## 2020-06-07 MED ORDER — DIPHENHYDRAMINE HCL 25 MG PO CAPS
ORAL_CAPSULE | ORAL | Status: AC
  Filled 2020-06-07: qty 1

## 2020-06-07 MED ORDER — SODIUM CHLORIDE 0.9 % IV SOLN
Freq: Once | INTRAVENOUS | Status: AC
Start: 2020-06-07 — End: 2020-06-07
  Filled 2020-06-07: qty 250

## 2020-06-07 MED ORDER — ACETAMINOPHEN 325 MG PO TABS
ORAL_TABLET | ORAL | Status: AC
  Filled 2020-06-07: qty 2

## 2020-06-07 MED ORDER — SODIUM CHLORIDE 0.9% FLUSH
10.0000 mL | INTRAVENOUS | Status: DC | PRN
  Administered 2020-06-07: 10 mL via INTRAVENOUS
  Filled 2020-06-07: qty 10

## 2020-06-07 NOTE — Patient Instructions (Signed)
[facility] MEDICAL ONCOLOGY  Discharge Instructions: Thank you for choosing [facility] to provide your oncology and hematology care.   If you have a lab appointment with the Cancer Center, please go directly to the Cancer Center and check in at the registration area.   Wear comfortable clothing and clothing appropriate for easy access to any Portacath or PICC line.   We strive to give you quality time with your provider. You may need to reschedule your appointment if you arrive late (15 or more minutes).  Arriving late affects you and other patients whose appointments are after yours.  Also, if you miss three or more appointments without notifying the office, you may be dismissed from the clinic at the provider's discretion.      For prescription refill requests, have your pharmacy contact our office and allow 72 hours for refills to be completed.    Today you received the following chemotherapy and/or immunotherapy agents herceptin, perjeta      To help prevent nausea and vomiting after your treatment, we encourage you to take your nausea medication as directed.  BELOW ARE SYMPTOMS THAT SHOULD BE REPORTED IMMEDIATELY: *FEVER GREATER THAN 100.4 F (38 C) OR HIGHER *CHILLS OR SWEATING *NAUSEA AND VOMITING THAT IS NOT CONTROLLED WITH YOUR NAUSEA MEDICATION *UNUSUAL SHORTNESS OF BREATH *UNUSUAL BRUISING OR BLEEDING *URINARY PROBLEMS (pain or burning when urinating, or frequent urination) *BOWEL PROBLEMS (unusual diarrhea, constipation, pain near the anus) TENDERNESS IN MOUTH AND THROAT WITH OR WITHOUT PRESENCE OF ULCERS (sore throat, sores in mouth, or a toothache) UNUSUAL RASH, SWELLING OR PAIN  UNUSUAL VAGINAL DISCHARGE OR ITCHING   Items with * indicate a potential emergency and should be followed up as soon as possible or go to the Emergency Department if any problems should occur.  Please show the CHEMOTHERAPY ALERT CARD or IMMUNOTHERAPY ALERT CARD at  check-in to the Emergency Department and triage nurse.  Should you have questions after your visit or need to cancel or reschedule your appointment, please contact [facility] MEDICAL ONCOLOG[facility]  Dept: [phone]  and follow the prompts.  Office hours are 8:00 a.m. to 4:30 p.m. Monday - Friday. Please note that voicemails left after 4:00 p.m. may not be returned until the following business day.  We are closed weekends and major holidays. You have access to a nurse at all times for urgent questions. Please call the main number to the clinic Dept: [phone] and follow the prompts.   For any non-urgent questions, you may also contact your provider using MyChart. We now offer e-Visits for anyone 18 and older to request care online for non-urgent symptoms. For details visit [email or web address].   Also download the MyChart app! Go to the app store, search "MyChart", open the app, select , and log in with your MyChart username and password.  Due to Covid, a mask is required upon entering the hospital/clinic. If you do not have a mask, one will be given to you upon arrival. For doctor visits, patients may have 1 support person aged 18 or older with them. For treatment visits, patients cannot have anyone with them due to current Covid guidelines and our immunocompromised population.   

## 2020-06-07 NOTE — Progress Notes (Signed)
Pt refused to stay for 30 min wait after perjeta. Explained risks associated with it. Pt. States understanding.

## 2020-06-09 DIAGNOSIS — R269 Unspecified abnormalities of gait and mobility: Secondary | ICD-10-CM | POA: Diagnosis not present

## 2020-06-09 DIAGNOSIS — M62562 Muscle wasting and atrophy, not elsewhere classified, left lower leg: Secondary | ICD-10-CM | POA: Diagnosis not present

## 2020-06-09 DIAGNOSIS — M62561 Muscle wasting and atrophy, not elsewhere classified, right lower leg: Secondary | ICD-10-CM | POA: Diagnosis not present

## 2020-06-09 DIAGNOSIS — R296 Repeated falls: Secondary | ICD-10-CM | POA: Diagnosis not present

## 2020-06-13 ENCOUNTER — Encounter: Payer: Medicare PPO | Attending: Family Medicine | Admitting: Dietician

## 2020-06-13 ENCOUNTER — Encounter: Payer: Self-pay | Admitting: Dietician

## 2020-06-13 ENCOUNTER — Other Ambulatory Visit: Payer: Self-pay

## 2020-06-13 DIAGNOSIS — Z794 Long term (current) use of insulin: Secondary | ICD-10-CM | POA: Diagnosis not present

## 2020-06-13 DIAGNOSIS — N184 Chronic kidney disease, stage 4 (severe): Secondary | ICD-10-CM

## 2020-06-13 DIAGNOSIS — E1140 Type 2 diabetes mellitus with diabetic neuropathy, unspecified: Secondary | ICD-10-CM

## 2020-06-13 NOTE — Patient Instructions (Signed)
Resources: Low sodium Silver Palate Pasta sauce [email or web address] Calorie [employer].com or app  Be mindful of your sodium intake  Cook at home most often  Avoid added salt (or use just a pinch)  Fresh meat (less beef and choose lean)  LS [place]  Rinse canned beans or buy low sodium or organic which is lower in sodium  Read labels (limit to 600 mg per meal)  Avoid all salt substitutes and seasonings with Potassium Chloride.  Avoid foods with phos... on the ingredient list.  Be mindful of your protein portion size.  Goal:  About 60 grams daily  Stay hydrated There is no need to restrict your potassium intake at this time as your potassium labs have been normal.

## 2020-06-13 NOTE — Progress Notes (Signed)
Diabetes Self-Management Education  Visit Type: First/Initial  Appt. Start Time: 1110 Appt. End Time: 1230  06/13/2020  [name], identified by name and date of birth, is a 70 y.o. female with a diagnosis of Diabetes: Type 2.   ASSESSMENT Patient is here today alone.  She was last seen by an RD in this office 11/15/2015.  She is followed by Dennard Nip, MD. She signed up for renal tracker app.   She states that she needs to figure out exactly what she needs to eat personally. She has been trying to decrease her sodium intake but her husband enjoys increased take out and pre prepared food at the grocery store. She has stopped most cheese.   She uses [employer] [place]. She aims to follow an 1800 calorie diet and uses My Fitness Pal app to follow. She states that she does not eat enough fruits and vegetables.   Her husband does the shopping often and likes to help. Fasting blood glucose 70's-80 and 110-120 in the evening.  145 after a sweet. 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 Medications include:  Farxiga, Lantus 8 units q am, Victoza, calcium, vitamin D, flinstones MVI bid, calcium with D A1C 5.2% 12/2019  Weight hx: 172 lbs today Current goal:  Weight maintenance. Highest weight 335 lbs  Body Composition scale 06/13/2020: 33.8% body fat 47.5% 2ater, 25.2 BMI  Nutrition goals: 1800 calories 60 grams protein 2 grams sodium  Patient lives with her husband who helps with the shopping and cooking. She is retired.  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 Physical activity with PT and does these exercises at home as well.  Weight 172 lb (78 kg), last menstrual period 10/22/2007. Body mass index is 25.77  kg/m.   Diabetes Self-Management Education - 06/13/20 1142      Visit Information   Visit Type First/Initial      Initial Visit   Diabetes Type Type 2    Are you currently following a meal plan? No    Are you taking your medications as prescribed? Yes    Date Diagnosed 1984      Health Coping   How would you rate your overall health? Good      Psychosocial Assessment   Patient Belief/Attitude about Diabetes Defeat/Burnout    Self-care barriers None    Self-management support Doctor's office    Other persons present Patient    Patient Concerns Nutrition/Meal planning    Special Needs None    Preferred Learning Style No preference indicated    Learning Readiness Ready    How often do you need to have someone help you when you read instructions, pamphlets, or other written materials from your doctor or pharmacy? 1 - Never    What is the last grade level you completed in school? BA in [place]      Pre-Education Assessment   Patient understands the diabetes disease and treatment process. Needs Review    Patient understands incorporating nutritional management into lifestyle. Needs Review    Patient undertands incorporating physical activity into lifestyle. Needs Review    Patient understands using medications safely. Needs Review    Patient understands monitoring blood glucose, interpreting and using results Needs Review  Patient understands prevention, detection, and treatment of acute complications. Needs Review    Patient understands prevention, detection, and treatment of chronic complications. Needs Review    Patient understands how to develop strategies to address psychosocial issues. Needs Review    Patient understands how to develop strategies to promote health/change behavior. Needs Review      Complications   Last HgB A1C per patient/outside source 5.2 %   01/03/2021   How often do you check your blood sugar? 1-2 times/day    Fasting Blood glucose range (mg/dL)  70-129    Postprandial Blood glucose range (mg/dL) 70-129    Number of hypoglycemic episodes per month 0    Number of hyperglycemic episodes per week 0    Have you had a dilated eye exam in the past 12 months? Yes    Have you had a dental exam in the past 12 months? Yes    Are you checking your feet? Yes    How many days per week are you checking your feet? 3      Dietary Intake   Breakfast 1/3 cup oatmeal, walnuts, splenda, pinch of salt cooked, 1 cup Fairlife skim milk, water, occasional black decaf coffee    Lunch sandwich (45 calorie bread, 3 oz LS boar's head or other higher sodium meat) OR black beans and corn, salsa    Snack (afternoon) occasional NABS, alsmost daily Starbucks non fat mocha frapechino (regular) and occasional cookie or cake    Dinner 4 oz fried in oil hamburger, pretzel bun with ketchup, honey mustard, mayo OR fried pork chop, frozen vegetable (creamed spinach) or canned tomatoes and okra OR cauliflower "potatoes"    Snack (evening) rare honey nut cheerios (dry)    Beverage(s) water, starbucks sweetened frapechino, black coffee, decaf diet Mt. Dew, 1 cup Fairlife milk, occasional wine - 1-3 glasses 1-2 times per week      Exercise   Exercise Type Light (walking / raking leaves)   physical therapy   How many days per week to you exercise? 4    How many minutes per day do you exercise? 60    Total minutes per week of exercise 240      Patient Education   Previous Diabetes Education Yes (please comment)   multiple visits   Nutrition management  Other (comment);Meal options for control of blood glucose level and chronic complications.;Food label reading, portion sizes and measuring food.   renal diet   Medications Reviewed patients medication for diabetes, action, purpose, timing of dose and side effects.      Individualized Goals (developed by patient)   Nutrition Other (comment)   renal diabetic diet   Physical Activity Exercise 3-5 times per week;60 minutes per  day    Medications take my medication as prescribed    Monitoring  test my blood glucose as discussed    Health Coping discuss diabetes with (comment)   MD, RD, CDCES, pharmacist     [name]-Education Assessment   Patient understands the diabetes disease and treatment process. Demonstrates understanding / competency    Patient understands incorporating nutritional management into lifestyle. Needs Review    Patient undertands incorporating physical activity into lifestyle. Demonstrates understanding / competency    Patient understands using medications safely. Demonstrates understanding / competency    Patient understands monitoring blood glucose, interpreting and using results Demonstrates understanding / competency    Patient understands prevention, detection, and treatment of acute complications. Demonstrates understanding / competency    Patient understands  prevention, detection, and treatment of chronic complications. Demonstrates understanding / competency    Patient understands how to develop strategies to address psychosocial issues. Demonstrates understanding / competency    Patient understands how to develop strategies to promote health/change behavior. Needs Review      Outcomes   Expected Outcomes Demonstrated interest in learning. Expect positive outcomes    Future DMSE 4-6 wks    Program Status Not Completed           Individualized Plan for Diabetes Self-Management Training:   Learning Objective:  Patient will have a greater understanding of diabetes self-management. Patient education plan is to attend individual and/or group sessions per assessed needs and concerns.   Plan:   Patient Instructions  Resources: Low sodium Silver Palate Pasta sauce [email or web address] [email or web address] or app  Be mindful of your sodium intake  Cook at home most often  Avoid added salt (or use just a pinch)  Fresh meat (less beef and choose lean)  LS [employer] [place]  Rinse canned beans or  buy low sodium or organic which is lower in sodium  Read labels (limit to 600 mg per meal)  Avoid all salt substitutes and seasonings with Potassium Chloride.  Avoid foods with phos... on the ingredient list.  Be mindful of your protein portion size.  Goal:  About 60 grams daily  Stay hydrated There is no need to restrict your potassium intake at this time as your potassium labs have been normal.       Expected Outcomes:  Demonstrated interest in learning. Expect positive outcomes  Education material provided: Food label handouts, NKD National Kidney Diet - Dish up a Kidney Friendly diet for patients with chronic kidney disease  If problems or questions, patient to contact team via:  Phone  Future DSME appointment: 4-6 wks

## 2020-06-14 DIAGNOSIS — M62562 Muscle wasting and atrophy, not elsewhere classified, left lower leg: Secondary | ICD-10-CM | POA: Diagnosis not present

## 2020-06-14 DIAGNOSIS — R269 Unspecified abnormalities of gait and mobility: Secondary | ICD-10-CM | POA: Diagnosis not present

## 2020-06-14 DIAGNOSIS — R296 Repeated falls: Secondary | ICD-10-CM | POA: Diagnosis not present

## 2020-06-14 DIAGNOSIS — M62561 Muscle wasting and atrophy, not elsewhere classified, right lower leg: Secondary | ICD-10-CM | POA: Diagnosis not present

## 2020-06-20 DIAGNOSIS — R296 Repeated falls: Secondary | ICD-10-CM | POA: Diagnosis not present

## 2020-06-20 DIAGNOSIS — M62561 Muscle wasting and atrophy, not elsewhere classified, right lower leg: Secondary | ICD-10-CM | POA: Diagnosis not present

## 2020-06-20 DIAGNOSIS — R269 Unspecified abnormalities of gait and mobility: Secondary | ICD-10-CM | POA: Diagnosis not present

## 2020-06-20 DIAGNOSIS — M62562 Muscle wasting and atrophy, not elsewhere classified, left lower leg: Secondary | ICD-10-CM | POA: Diagnosis not present

## 2020-06-23 DIAGNOSIS — M62561 Muscle wasting and atrophy, not elsewhere classified, right lower leg: Secondary | ICD-10-CM | POA: Diagnosis not present

## 2020-06-23 DIAGNOSIS — M62562 Muscle wasting and atrophy, not elsewhere classified, left lower leg: Secondary | ICD-10-CM | POA: Diagnosis not present

## 2020-06-23 DIAGNOSIS — R296 Repeated falls: Secondary | ICD-10-CM | POA: Diagnosis not present

## 2020-06-23 DIAGNOSIS — R269 Unspecified abnormalities of gait and mobility: Secondary | ICD-10-CM | POA: Diagnosis not present

## 2020-06-26 ENCOUNTER — Other Ambulatory Visit (HOSPITAL_COMMUNITY): Payer: Self-pay | Admitting: Cardiology

## 2020-06-27 DIAGNOSIS — R269 Unspecified abnormalities of gait and mobility: Secondary | ICD-10-CM | POA: Diagnosis not present

## 2020-06-27 DIAGNOSIS — R296 Repeated falls: Secondary | ICD-10-CM | POA: Diagnosis not present

## 2020-06-27 DIAGNOSIS — M62562 Muscle wasting and atrophy, not elsewhere classified, left lower leg: Secondary | ICD-10-CM | POA: Diagnosis not present

## 2020-06-27 DIAGNOSIS — M62561 Muscle wasting and atrophy, not elsewhere classified, right lower leg: Secondary | ICD-10-CM | POA: Diagnosis not present

## 2020-06-28 ENCOUNTER — Encounter: Payer: Self-pay | Admitting: Podiatry

## 2020-06-28 ENCOUNTER — Encounter (INDEPENDENT_AMBULATORY_CARE_PROVIDER_SITE_OTHER): Payer: Self-pay | Admitting: Family Medicine

## 2020-06-29 ENCOUNTER — Telehealth (INDEPENDENT_AMBULATORY_CARE_PROVIDER_SITE_OTHER): Payer: Self-pay

## 2020-06-29 DIAGNOSIS — C50911 Malignant neoplasm of unspecified site of right female breast: Secondary | ICD-10-CM | POA: Diagnosis not present

## 2020-06-29 NOTE — Telephone Encounter (Signed)
Pt called in and stated that she has missed paced her Losartan. The pt was told by the pharmacy to contact us and resend a new refill. The pt uses [facility]. Please advise

## 2020-06-30 ENCOUNTER — Ambulatory Visit: Payer: Medicare PPO | Admitting: Dietician

## 2020-07-02 ENCOUNTER — Encounter: Payer: Self-pay | Admitting: Podiatry

## 2020-07-03 MED ORDER — MUPIROCIN 2 % EX OINT: 1.0000 "application " | TOPICAL_OINTMENT | Freq: Every day | CUTANEOUS | 2 refills | Status: DC

## 2020-07-03 NOTE — Telephone Encounter (Signed)
Will refill Losartan at patient's 07/04/20 visit

## 2020-07-04 ENCOUNTER — Other Ambulatory Visit (INDEPENDENT_AMBULATORY_CARE_PROVIDER_SITE_OTHER): Payer: Self-pay | Admitting: Family Medicine

## 2020-07-04 ENCOUNTER — Other Ambulatory Visit: Payer: Self-pay

## 2020-07-04 ENCOUNTER — Ambulatory Visit (INDEPENDENT_AMBULATORY_CARE_PROVIDER_SITE_OTHER): Payer: Medicare PPO | Admitting: Family Medicine

## 2020-07-04 ENCOUNTER — Encounter (INDEPENDENT_AMBULATORY_CARE_PROVIDER_SITE_OTHER): Payer: Self-pay | Admitting: Family Medicine

## 2020-07-04 VITALS — BP 106/64 | HR 57 | Temp 97.7°F | Ht 69.0 in | Wt 167.0 lb

## 2020-07-04 DIAGNOSIS — M62561 Muscle wasting and atrophy, not elsewhere classified, right lower leg: Secondary | ICD-10-CM | POA: Diagnosis not present

## 2020-07-04 DIAGNOSIS — I10 Essential (primary) hypertension: Secondary | ICD-10-CM | POA: Diagnosis not present

## 2020-07-04 DIAGNOSIS — N184 Chronic kidney disease, stage 4 (severe): Secondary | ICD-10-CM | POA: Diagnosis not present

## 2020-07-04 DIAGNOSIS — B372 Candidiasis of skin and nail: Secondary | ICD-10-CM | POA: Diagnosis not present

## 2020-07-04 DIAGNOSIS — Z794 Long term (current) use of insulin: Secondary | ICD-10-CM | POA: Diagnosis not present

## 2020-07-04 DIAGNOSIS — M62562 Muscle wasting and atrophy, not elsewhere classified, left lower leg: Secondary | ICD-10-CM | POA: Diagnosis not present

## 2020-07-04 DIAGNOSIS — E669 Obesity, unspecified: Secondary | ICD-10-CM

## 2020-07-04 DIAGNOSIS — E1159 Type 2 diabetes mellitus with other circulatory complications: Secondary | ICD-10-CM

## 2020-07-04 DIAGNOSIS — F3289 Other specified depressive episodes: Secondary | ICD-10-CM | POA: Diagnosis not present

## 2020-07-04 DIAGNOSIS — R296 Repeated falls: Secondary | ICD-10-CM | POA: Diagnosis not present

## 2020-07-04 DIAGNOSIS — Z6833 Body mass index (BMI) 33.0-33.9, adult: Secondary | ICD-10-CM

## 2020-07-04 DIAGNOSIS — E1122 Type 2 diabetes mellitus with diabetic chronic kidney disease: Secondary | ICD-10-CM | POA: Diagnosis not present

## 2020-07-04 DIAGNOSIS — R269 Unspecified abnormalities of gait and mobility: Secondary | ICD-10-CM | POA: Diagnosis not present

## 2020-07-04 MED ORDER — LOSARTAN POTASSIUM 100 MG PO TABS: 100.0000 mg | ORAL_TABLET | Freq: Every day | ORAL | 0 refills | Status: DC

## 2020-07-04 MED ORDER — BUPROPION HCL ER (SR) 200 MG PO TB12: ORAL_TABLET | ORAL | 0 refills | Status: DC

## 2020-07-04 MED ORDER — VICTOZA 18 MG/3ML ~~LOC~~ SOPN: PEN_INJECTOR | SUBCUTANEOUS | 0 refills | Status: DC

## 2020-07-04 MED ORDER — NYSTATIN 100000 UNIT/GM EX POWD: 1.0000 "application " | Freq: Four times a day (QID) | CUTANEOUS | 1 refills | Status: DC

## 2020-07-04 MED ORDER — VORTIOXETINE HBR 20 MG PO TABS: 20.0000 mg | ORAL_TABLET | Freq: Every day | ORAL | 0 refills | Status: DC

## 2020-07-04 MED ORDER — TOPIRAMATE 25 MG PO TABS: 25.0000 mg | ORAL_TABLET | Freq: Every day | ORAL | 0 refills | Status: DC

## 2020-07-04 NOTE — Telephone Encounter (Signed)
Pt last seen by Dr. Beasley.  

## 2020-07-05 ENCOUNTER — Inpatient Hospital Stay: Payer: Medicare PPO | Attending: Oncology

## 2020-07-05 ENCOUNTER — Other Ambulatory Visit: Payer: Self-pay

## 2020-07-05 ENCOUNTER — Other Ambulatory Visit: Payer: Self-pay | Admitting: Oncology

## 2020-07-05 ENCOUNTER — Inpatient Hospital Stay: Payer: Medicare PPO

## 2020-07-05 ENCOUNTER — Encounter: Payer: Self-pay | Admitting: Oncology

## 2020-07-05 VITALS — BP 111/53 | HR 61 | Temp 98.1°F | Resp 18

## 2020-07-05 DIAGNOSIS — C50811 Malignant neoplasm of overlapping sites of right female breast: Secondary | ICD-10-CM | POA: Insufficient documentation

## 2020-07-05 DIAGNOSIS — C50411 Malignant neoplasm of upper-outer quadrant of right female breast: Secondary | ICD-10-CM

## 2020-07-05 DIAGNOSIS — Z5112 Encounter for antineoplastic immunotherapy: Secondary | ICD-10-CM | POA: Diagnosis not present

## 2020-07-05 DIAGNOSIS — C801 Malignant (primary) neoplasm, unspecified: Secondary | ICD-10-CM

## 2020-07-05 DIAGNOSIS — C787 Secondary malignant neoplasm of liver and intrahepatic bile duct: Secondary | ICD-10-CM

## 2020-07-05 DIAGNOSIS — Z171 Estrogen receptor negative status [ER-]: Secondary | ICD-10-CM

## 2020-07-05 DIAGNOSIS — C50911 Malignant neoplasm of unspecified site of right female breast: Secondary | ICD-10-CM

## 2020-07-05 DIAGNOSIS — Z95828 Presence of other vascular implants and grafts: Secondary | ICD-10-CM

## 2020-07-05 LAB — CBC WITH DIFFERENTIAL/PLATELET
Abs Immature Granulocytes: 0.02 10*3/uL (ref 0.00–0.07)
Basophils Absolute: 0 10*3/uL (ref 0.0–0.1)
Basophils Relative: 1 %
Eosinophils Absolute: 0.1 10*3/uL (ref 0.0–0.5)
Eosinophils Relative: 2 %
HCT: 30.4 % — ABNORMAL LOW (ref 36.0–46.0)
Hemoglobin: 10.3 g/dL — ABNORMAL LOW (ref 12.0–15.0)
Immature Granulocytes: 1 %
Lymphocytes Relative: 25 %
Lymphs Abs: 1 10*3/uL (ref 0.7–4.0)
MCH: 32.1 pg (ref 26.0–34.0)
MCHC: 33.9 g/dL (ref 30.0–36.0)
MCV: 94.7 fL (ref 80.0–100.0)
Monocytes Absolute: 0.3 10*3/uL (ref 0.1–1.0)
Monocytes Relative: 8 %
Neutro Abs: 2.7 10*3/uL (ref 1.7–7.7)
Neutrophils Relative %: 63 %
Platelets: 158 10*3/uL (ref 150–400)
RBC: 3.21 MIL/uL — ABNORMAL LOW (ref 3.87–5.11)
RDW: 12.2 % (ref 11.5–15.5)
WBC: 4.2 10*3/uL (ref 4.0–10.5)
nRBC: 0 % (ref 0.0–0.2)

## 2020-07-05 LAB — HEMOGLOBIN A1C
Est. average glucose Bld gHb Est-mCnc: 105 mg/dL
Hgb A1c MFr Bld: 5.3 % (ref 4.8–5.6)

## 2020-07-05 LAB — PHOSPHORUS: Phosphorus: 3.4 mg/dL (ref 3.0–4.3)

## 2020-07-05 LAB — COMPREHENSIVE METABOLIC PANEL
ALT: 12 U/L (ref 0–44)
AST: 21 U/L (ref 15–41)
Albumin: 3.9 g/dL (ref 3.5–5.0)
Alkaline Phosphatase: 47 U/L (ref 38–126)
Anion gap: 8 (ref 5–15)
BUN: 46 mg/dL — ABNORMAL HIGH (ref 8–23)
CO2: 24 mmol/L (ref 22–32)
Calcium: 9.2 mg/dL (ref 8.9–10.3)
Chloride: 109 mmol/L (ref 98–111)
Creatinine, Ser: 2.22 mg/dL — ABNORMAL HIGH (ref 0.44–1.00)
GFR, Estimated: 24 mL/min — ABNORMAL LOW (ref >60)
Glucose, Bld: 121 mg/dL — ABNORMAL HIGH (ref 70–99)
Potassium: 4.5 mmol/L (ref 3.5–5.1)
Sodium: 141 mmol/L (ref 135–145)
Total Bilirubin: 0.5 mg/dL (ref 0.3–1.2)
Total Protein: 6.2 g/dL — ABNORMAL LOW (ref 6.5–8.1)

## 2020-07-05 LAB — CEA (IN HOUSE-CHCC): CEA (CHCC-In House): 8.24 ng/mL — ABNORMAL HIGH (ref 0.00–5.00)

## 2020-07-05 MED ORDER — SODIUM CHLORIDE 0.9% FLUSH
10.0000 mL | INTRAVENOUS | Status: DC | PRN
  Administered 2020-07-05: 10 mL
  Filled 2020-07-05: qty 10

## 2020-07-05 MED ORDER — SODIUM CHLORIDE 0.9% FLUSH
10.0000 mL | INTRAVENOUS | Status: DC | PRN
Start: 2020-07-05 — End: 2020-07-05
  Administered 2020-07-05: 10 mL via INTRAVENOUS
  Filled 2020-07-05: qty 10

## 2020-07-05 MED ORDER — ACETAMINOPHEN 325 MG PO TABS
ORAL_TABLET | ORAL | Status: AC
  Filled 2020-07-05: qty 2

## 2020-07-05 MED ORDER — DIPHENHYDRAMINE HCL 25 MG PO CAPS
ORAL_CAPSULE | ORAL | Status: AC
  Filled 2020-07-05: qty 1

## 2020-07-05 MED ORDER — SODIUM CHLORIDE 0.9 % IV SOLN
420.0000 mg | Freq: Once | INTRAVENOUS | Status: AC
  Administered 2020-07-05: 420 mg via INTRAVENOUS
  Filled 2020-07-05: qty 14

## 2020-07-05 MED ORDER — DIPHENHYDRAMINE HCL 25 MG PO CAPS
25.0000 mg | ORAL_CAPSULE | Freq: Once | ORAL | Status: AC
  Administered 2020-07-05: 25 mg via ORAL

## 2020-07-05 MED ORDER — TRASTUZUMAB-ANNS CHEMO 420 MG IV SOLR
450.0000 mg | Freq: Once | INTRAVENOUS | Status: AC
  Administered 2020-07-05: 450 mg via INTRAVENOUS
  Filled 2020-07-05: qty 21.43

## 2020-07-05 MED ORDER — HEPARIN SOD (PORK) LOCK FLUSH 100 UNIT/ML IV SOLN
500.0000 [IU] | Freq: Once | INTRAVENOUS | Status: AC | PRN
  Administered 2020-07-05: 500 [IU]
  Filled 2020-07-05: qty 5

## 2020-07-05 MED ORDER — ACETAMINOPHEN 325 MG PO TABS
650.0000 mg | ORAL_TABLET | Freq: Once | ORAL | Status: AC
Start: 2020-07-05 — End: 2020-07-05
  Administered 2020-07-05: 650 mg via ORAL

## 2020-07-05 MED ORDER — SODIUM CHLORIDE 0.9 % IV SOLN
Freq: Once | INTRAVENOUS | Status: AC
Start: 2020-07-05 — End: 2020-07-05
  Filled 2020-07-05: qty 250

## 2020-07-10 ENCOUNTER — Ambulatory Visit: Payer: Medicare PPO | Admitting: Podiatry

## 2020-07-10 ENCOUNTER — Other Ambulatory Visit: Payer: Self-pay

## 2020-07-10 ENCOUNTER — Encounter: Payer: Self-pay | Admitting: Podiatry

## 2020-07-10 DIAGNOSIS — M2011 Hallux valgus (acquired), right foot: Secondary | ICD-10-CM | POA: Diagnosis not present

## 2020-07-10 DIAGNOSIS — M19071 Primary osteoarthritis, right ankle and foot: Secondary | ICD-10-CM

## 2020-07-10 DIAGNOSIS — L600 Ingrowing nail: Secondary | ICD-10-CM | POA: Diagnosis not present

## 2020-07-10 DIAGNOSIS — M2041 Other hammer toe(s) (acquired), right foot: Secondary | ICD-10-CM

## 2020-07-10 DIAGNOSIS — M21611 Bunion of right foot: Secondary | ICD-10-CM

## 2020-07-10 NOTE — Progress Notes (Signed)
Chief Complaint:   OBESITY Darlene [name] is here to discuss her progress with her obesity treatment plan along with follow-up of her obesity related diagnoses. Darlene [name] is on keeping a food journal and adhering to recommended goals of 1800 calories and 85+ grams of protein daily and states she is following her eating plan approximately 90% of the time. Darlene [name] states she is doing physical therapy for 1 hour, and at home physical therapy for 20 minutes 4 times per week.  Today's visit was #: 4 Starting weight: 225 lbs Starting date: 08/09/2016 Today's weight: 167 lbs Today's date: 07/04/2020 Total lbs lost to date: 63 Total lbs lost since last in-office visit: 2  Interim History: Darlene [name] continues to work on [employer]. She is trying to maintain her weight but her weight is drifting down as she is concentrating on there details of her nutrition.   Subjective:   1. Essential hypertension Darlene [name]'s blood pressure is stable on her medications. She has 1 episode of feeling hypotensive but none since. She feels it was due to dehydration.  2. Type 2 diabetes mellitus with stage 4 chronic kidney disease, with long-term current use of insulin ([place]) Darlene [name] is seeing a diabetic registered dietician to help her with both her blood sugars and her chronic renal failure. She requests a phos level check.  3. Cutaneous candidiasis Darlene [name] notes a red itchy rash under her abdominal fold for a "few days". She requests a refill to help with this.  4. Other depression with emotional eating Darlene [name]'s mood is stable on her medications. She denies signs of serotonin syndrome.  Assessment/Plan:   1. Essential hypertension Darlene [name] will continue working on healthy weight loss and exercise to improve blood pressure control. We will watch for signs of hypotension as she continues her lifestyle modifications. We will refill losartan for 90 days with no refills, and she is to increase her water  intake.  - losartan (COZAAR) 100 MG tablet; Take 1 tablet (100 mg total) by mouth daily.  Dispense: 90 tablet; Refill: 0  2. Type 2 diabetes mellitus with stage 4 chronic kidney disease, with long-term current use of insulin ([facility]) We will check labs today, and we will refill Victoza for 90 days with no refills. Good blood sugar control is important to decrease the likelihood of diabetic complications such as nephropathy, neuropathy, limb loss, blindness, coronary artery disease, and death. Intensive lifestyle modification including diet, exercise and weight loss are the first line of treatment for diabetes.   - liraglutide (VICTOZA) 18 MG/3ML SOPN; Pt to take 1.8 mg qam  Dispense: 27 mL; Refill: 0 - Phosphorus - Hemoglobin A1c  3. Cutaneous candidiasis Darlene [name] agreed to start Nystatin powder 100,000 units/grams 4 times daily 56 grams 1 time, if no improvement in 3-4 days she is to discuss this with her primary care physician.  - nystatin (MYCOSTATIN/NYSTOP) powder; Apply 1 application topically in the morning, at noon, in the evening, and at bedtime.  Dispense: 56 g; Refill: 1  4. Other depression with emotional eating Behavior modification techniques were discussed today to help Darlene [name] deal with her emotional/non-hunger eating behaviors. We will refill Topamax, Wellbutrin SR, and Trintellix for 90 days with no refills. Orders and follow up as documented in patient record.   - topiramate (TOPAMAX) 25 MG tablet; Take 1 tablet (25 mg total) by mouth daily.  Dispense: 90 tablet; Refill: 0 - vortioxetine HBr (TRINTELLIX) 20 MG TABS tablet; Take 1 tablet (20 mg total) by mouth at bedtime.  Dispense: 90 tablet; Refill: 0 - buPROPion (WELLBUTRIN SR) 200 MG 12 hr tablet; Take one tablet by mouth once daily.  Dispense: 90 tablet; Refill: 0  5. Obesity with current BMI 24.7 Darlene [name] is currently in the action stage of change. As such, her goal is to continue with weight loss efforts. She has agreed  to keeping a food journal and adhering to recommended goals of 1800 calories and 85+ grams of protein daily.   Darlene [name] was encouraged to increase healthy fats such as olive oil with her vegetables or avocado to help bump up calories and maintain her weight.  Exercise goals: As is.  Behavioral modification strategies: increasing vegetables and meal planning and cooking strategies.  Darlene [name] has agreed to follow-up with our clinic in 3 months. She was informed of the importance of frequent follow-up visits to maximize her success with intensive lifestyle modifications for her multiple health conditions.   Darlene [name] was informed we would discuss her lab results at her next visit unless there is a critical issue that needs to be addressed sooner. Darlene [name] agreed to keep her next visit at the agreed upon time to discuss these results.  Objective:   Blood pressure 106/64, pulse (!) 57, temperature 97.7 F (36.5 C), height 5\' 9"  (1.753 m), weight 167 lb (75.8 kg), last menstrual period 10/22/2007, SpO2 99 %. Body mass index is 24.66 kg/m.  General: Cooperative, alert, well developed, in no acute distress. HEENT: Conjunctivae and lids unremarkable. Cardiovascular: Regular rhythm.  Lungs: Normal work of breathing. Neurologic: No focal deficits.   Lab Results  Component Value Date   CREATININE 2.22 (H) 07/05/2020   BUN 46 (H) 07/05/2020   NA 141 07/05/2020   K 4.5 07/05/2020   CL 109 07/05/2020   CO2 24 07/05/2020   Lab Results  Component Value Date   ALT 12 07/05/2020   AST 21 07/05/2020   ALKPHOS 47 07/05/2020   BILITOT 0.5 07/05/2020   Lab Results  Component Value Date   HGBA1C 5.3 07/04/2020   HGBA1C 5.2 01/04/2020   HGBA1C 5.4 10/05/2019   HGBA1C 5.6 05/11/2019   HGBA1C 5.5 01/25/2019   Lab Results  Component Value Date   INSULIN 7.6 01/25/2019   INSULIN 15.3 03/11/2017   INSULIN 18.3 08/09/2016   Lab Results  Component Value Date   TSH 1.54 02/07/2017   Lab  Results  Component Value Date   CHOL 88 (L) 10/25/2019   HDL 58 10/25/2019   LDLCALC 18 10/25/2019   TRIG 48 10/25/2019   CHOLHDL 1.5 10/25/2019   Lab Results  Component Value Date   WBC 4.2 07/05/2020   HGB 10.3 (L) 07/05/2020   HCT 30.4 (L) 07/05/2020   MCV 94.7 07/05/2020   PLT 158 07/05/2020   Lab Results  Component Value Date   IRON 58 02/18/2013   TIBC 305 02/18/2013   FERRITIN 133 10/31/2014    Obesity Behavioral Intervention:   Approximately 15 minutes were spent on the discussion below.  ASK: We discussed the diagnosis of obesity with Darlene [name] today and Darlene [name] agreed to give [name] permission to discuss obesity behavioral modification therapy today.  ASSESS: Darlene [name] has the diagnosis of obesity and her BMI today is 24.65. Darlene [name] is in the action stage of change.   ADVISE: Darlene [name] was educated on the multiple health risks of obesity as well as the benefit of weight loss to improve her health. She was advised of the need for long term treatment and the importance of lifestyle modifications  to improve her current health and to decrease her risk of future health problems.  AGREE: Multiple dietary modification options and treatment options were discussed and Darlene [name] agreed to follow the recommendations documented in the above note.  ARRANGE: Darlene [name] was educated on the importance of frequent visits to treat obesity as outlined per CMS and USPSTF guidelines and agreed to schedule her next follow up appointment today.  Attestation Statements:   Reviewed by clinician on day of visit: allergies, medications, problem list, medical history, surgical history, family history, social history, and previous encounter notes.   I, [name], am acting as transcriptionist for Dennard Nip, MD.  I have reviewed the above documentation for accuracy and completeness, and I agree with the above. -  Dennard Nip, MD

## 2020-07-11 ENCOUNTER — Encounter (INDEPENDENT_AMBULATORY_CARE_PROVIDER_SITE_OTHER): Payer: Self-pay | Admitting: Family Medicine

## 2020-07-11 ENCOUNTER — Other Ambulatory Visit (INDEPENDENT_AMBULATORY_CARE_PROVIDER_SITE_OTHER): Payer: Self-pay

## 2020-07-11 DIAGNOSIS — R296 Repeated falls: Secondary | ICD-10-CM | POA: Diagnosis not present

## 2020-07-11 DIAGNOSIS — M62561 Muscle wasting and atrophy, not elsewhere classified, right lower leg: Secondary | ICD-10-CM | POA: Diagnosis not present

## 2020-07-11 DIAGNOSIS — E1121 Type 2 diabetes mellitus with diabetic nephropathy: Secondary | ICD-10-CM

## 2020-07-11 DIAGNOSIS — R269 Unspecified abnormalities of gait and mobility: Secondary | ICD-10-CM | POA: Diagnosis not present

## 2020-07-11 DIAGNOSIS — M62562 Muscle wasting and atrophy, not elsewhere classified, left lower leg: Secondary | ICD-10-CM | POA: Diagnosis not present

## 2020-07-11 MED ORDER — ACCU-CHEK GUIDE VI STRP: ORAL_STRIP | 0 refills | Status: DC

## 2020-07-11 NOTE — Telephone Encounter (Signed)
Dr.Beasley 

## 2020-07-11 NOTE — Progress Notes (Signed)
  Subjective:  Patient ID: Darlene [name], female    DOB: [birth year],  MRN: [id number]  Chief Complaint  Patient presents with   Toe Injury    PT states she has a bloody spot on her sock from her right great toe a week and pt states she realized she had a cut and shes diabetic     70 y.o. female returns for follow-up with the above complaint. History confirmed with patient.  Right hallux nail is ingrowing and caused a bleeding area.  She also inquires about the painful bunions and second hammertoe which rubs together, she previously had bunion surgery several years ago  Objective:  Physical Exam: warm, good capillary refill, no trophic changes or ulcerative lesions, normal DP and PT pulses and reduced sensation at plantar soles of feet and pulps of toes. Left Foot: bunion deformity noted and Hammertoes, preulcerative callus plantar hallux   Right Foot: bunion deformity noted and Hammertoe deformity noted, incurvated right hallux nail with erythema, no paronychia  Assessment:   1. Ingrowing right great toenail   2. Hallux valgus with bunions, right   3. Hammertoe of right foot   4. Arthritis of first metatarsophalangeal (MTP) joint of right foot      Plan:  Patient was evaluated and treated and all questions answered.   Patient educated on diabetes. Discussed proper diabetic foot care and discussed risks and complications of disease. Educated patient in depth on reasons to return to the office immediately should he/she discover anything concerning or new on the feet. All questions answered. Discussed proper shoes as well.    Debrided the right hallux nail, it was causing a ingrowing nail with a spicule.  If this returns recommend partial permanent nail avulsion  We also discussed revision correction of her severe bunion deformity and hammertoes.  Discussed Lapidus arthrodesis versus a first metatarsal valvula joint arthrodesis.  I think this would be best to give her the best  result to reduce risk of recurrence and get the toe straight as possible and realign the toes.  Likely will need capsulotomy of the second MTPJ and hammertoe correction 3 and 4.  She would like to plan this for the end of summer and early fall.  Return in 4 weeks for follow-up both on the toenail and to take x-rays on the right foot to reevaluate and discuss surgical planning   Return in about 4 weeks (around 08/07/2020) for new x-rays, surgery planning visit right foot.

## 2020-07-12 DIAGNOSIS — E113513 Type 2 diabetes mellitus with proliferative diabetic retinopathy with macular edema, bilateral: Secondary | ICD-10-CM | POA: Diagnosis not present

## 2020-07-12 DIAGNOSIS — H3582 Retinal ischemia: Secondary | ICD-10-CM | POA: Diagnosis not present

## 2020-07-12 DIAGNOSIS — H31092 Other chorioretinal scars, left eye: Secondary | ICD-10-CM | POA: Diagnosis not present

## 2020-07-17 ENCOUNTER — Ambulatory Visit: Payer: Medicare PPO | Admitting: Podiatry

## 2020-07-17 DIAGNOSIS — N184 Chronic kidney disease, stage 4 (severe): Secondary | ICD-10-CM | POA: Diagnosis not present

## 2020-07-17 DIAGNOSIS — E1122 Type 2 diabetes mellitus with diabetic chronic kidney disease: Secondary | ICD-10-CM | POA: Diagnosis not present

## 2020-07-17 DIAGNOSIS — D631 Anemia in chronic kidney disease: Secondary | ICD-10-CM | POA: Diagnosis not present

## 2020-07-17 DIAGNOSIS — I129 Hypertensive chronic kidney disease with stage 1 through stage 4 chronic kidney disease, or unspecified chronic kidney disease: Secondary | ICD-10-CM | POA: Diagnosis not present

## 2020-07-17 DIAGNOSIS — Z853 Personal history of malignant neoplasm of breast: Secondary | ICD-10-CM | POA: Diagnosis not present

## 2020-07-25 DIAGNOSIS — C50911 Malignant neoplasm of unspecified site of right female breast: Secondary | ICD-10-CM | POA: Diagnosis not present

## 2020-07-27 ENCOUNTER — Other Ambulatory Visit: Payer: Medicare PPO

## 2020-07-27 ENCOUNTER — Other Ambulatory Visit: Payer: Self-pay

## 2020-07-28 ENCOUNTER — Other Ambulatory Visit: Payer: Medicare PPO

## 2020-07-31 ENCOUNTER — Other Ambulatory Visit: Payer: Self-pay

## 2020-07-31 ENCOUNTER — Encounter: Payer: Medicare PPO | Attending: Family Medicine | Admitting: Dietician

## 2020-07-31 DIAGNOSIS — Z794 Long term (current) use of insulin: Secondary | ICD-10-CM | POA: Diagnosis not present

## 2020-07-31 DIAGNOSIS — N184 Chronic kidney disease, stage 4 (severe): Secondary | ICD-10-CM | POA: Diagnosis not present

## 2020-07-31 DIAGNOSIS — E1122 Type 2 diabetes mellitus with diabetic chronic kidney disease: Secondary | ICD-10-CM | POA: Insufficient documentation

## 2020-07-31 NOTE — Patient Instructions (Addendum)
Chow Chow is a low sodium condiment option.  Make your requests known when eating out to try to get more lower sodium options.  When your husband wants something that is high sodium, consider your lower sodium options.  When you do drink alcohol.  Eat with this.  Unsalted almonds   1 oz cheese (swiss is low sodium, read labels for other things)  Fresh fruit  Unsalted pretzels  3 Ginger snaps  Hummus with vegetables  Ezekiel bread and Dave's killer bread is low in sodium or read labels or 45 calories  Aim for about 600 mg sodium per meal or less
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
## 2020-08-01 ENCOUNTER — Encounter (INDEPENDENT_AMBULATORY_CARE_PROVIDER_SITE_OTHER): Payer: Self-pay | Admitting: Family Medicine

## 2020-08-02 ENCOUNTER — Other Ambulatory Visit (INDEPENDENT_AMBULATORY_CARE_PROVIDER_SITE_OTHER): Payer: Self-pay | Admitting: Family Medicine

## 2020-08-02 ENCOUNTER — Inpatient Hospital Stay: Payer: Medicare PPO

## 2020-08-02 ENCOUNTER — Other Ambulatory Visit: Payer: Self-pay

## 2020-08-02 ENCOUNTER — Inpatient Hospital Stay: Payer: Medicare PPO | Attending: Oncology

## 2020-08-02 VITALS — BP 132/63 | HR 61 | Temp 98.1°F | Resp 18

## 2020-08-02 DIAGNOSIS — C50811 Malignant neoplasm of overlapping sites of right female breast: Secondary | ICD-10-CM | POA: Diagnosis not present

## 2020-08-02 DIAGNOSIS — C787 Secondary malignant neoplasm of liver and intrahepatic bile duct: Secondary | ICD-10-CM

## 2020-08-02 DIAGNOSIS — C50411 Malignant neoplasm of upper-outer quadrant of right female breast: Secondary | ICD-10-CM

## 2020-08-02 DIAGNOSIS — Z95828 Presence of other vascular implants and grafts: Secondary | ICD-10-CM

## 2020-08-02 DIAGNOSIS — Z171 Estrogen receptor negative status [ER-]: Secondary | ICD-10-CM

## 2020-08-02 DIAGNOSIS — N184 Chronic kidney disease, stage 4 (severe): Secondary | ICD-10-CM

## 2020-08-02 DIAGNOSIS — C801 Malignant (primary) neoplasm, unspecified: Secondary | ICD-10-CM

## 2020-08-02 DIAGNOSIS — Z5112 Encounter for antineoplastic immunotherapy: Secondary | ICD-10-CM | POA: Insufficient documentation

## 2020-08-02 DIAGNOSIS — C50911 Malignant neoplasm of unspecified site of right female breast: Secondary | ICD-10-CM

## 2020-08-02 LAB — COMPREHENSIVE METABOLIC PANEL
ALT: 12 U/L (ref 0–44)
AST: 23 U/L (ref 15–41)
Albumin: 3.8 g/dL (ref 3.5–5.0)
Alkaline Phosphatase: 49 U/L (ref 38–126)
Anion gap: 7 (ref 5–15)
BUN: 47 mg/dL — ABNORMAL HIGH (ref 8–23)
CO2: 24 mmol/L (ref 22–32)
Calcium: 9.3 mg/dL (ref 8.9–10.3)
Chloride: 110 mmol/L (ref 98–111)
Creatinine, Ser: 2.49 mg/dL — ABNORMAL HIGH (ref 0.44–1.00)
GFR, Estimated: 21 mL/min — ABNORMAL LOW (ref >60)
Glucose, Bld: 118 mg/dL — ABNORMAL HIGH (ref 70–99)
Potassium: 4.4 mmol/L (ref 3.5–5.1)
Sodium: 141 mmol/L (ref 135–145)
Total Bilirubin: 0.6 mg/dL (ref 0.3–1.2)
Total Protein: 6.2 g/dL — ABNORMAL LOW (ref 6.5–8.1)

## 2020-08-02 LAB — CBC WITH DIFFERENTIAL/PLATELET
Abs Immature Granulocytes: 0.02 10*3/uL (ref 0.00–0.07)
Basophils Absolute: 0 10*3/uL (ref 0.0–0.1)
Basophils Relative: 1 %
Eosinophils Absolute: 0.1 10*3/uL (ref 0.0–0.5)
Eosinophils Relative: 3 %
HCT: 30.8 % — ABNORMAL LOW (ref 36.0–46.0)
Hemoglobin: 10.6 g/dL — ABNORMAL LOW (ref 12.0–15.0)
Immature Granulocytes: 1 %
Lymphocytes Relative: 29 %
Lymphs Abs: 1.1 10*3/uL (ref 0.7–4.0)
MCH: 32.4 pg (ref 26.0–34.0)
MCHC: 34.4 g/dL (ref 30.0–36.0)
MCV: 94.2 fL (ref 80.0–100.0)
Monocytes Absolute: 0.3 10*3/uL (ref 0.1–1.0)
Monocytes Relative: 7 %
Neutro Abs: 2.3 10*3/uL (ref 1.7–7.7)
Neutrophils Relative %: 59 %
Platelets: 176 10*3/uL (ref 150–400)
RBC: 3.27 MIL/uL — ABNORMAL LOW (ref 3.87–5.11)
RDW: 12.5 % (ref 11.5–15.5)
WBC: 3.9 10*3/uL — ABNORMAL LOW (ref 4.0–10.5)
nRBC: 0 % (ref 0.0–0.2)

## 2020-08-02 MED ORDER — HEPARIN SOD (PORK) LOCK FLUSH 100 UNIT/ML IV SOLN
500.0000 [IU] | Freq: Once | INTRAVENOUS | Status: AC | PRN
  Administered 2020-08-02: 500 [IU]
  Filled 2020-08-02: qty 5

## 2020-08-02 MED ORDER — DIPHENHYDRAMINE HCL 25 MG PO CAPS
25.0000 mg | ORAL_CAPSULE | Freq: Once | ORAL | Status: AC
  Administered 2020-08-02: 25 mg via ORAL

## 2020-08-02 MED ORDER — SODIUM CHLORIDE 0.9% FLUSH
10.0000 mL | INTRAVENOUS | Status: DC | PRN
  Administered 2020-08-02: 10 mL
  Filled 2020-08-02: qty 10

## 2020-08-02 MED ORDER — SODIUM CHLORIDE 0.9% FLUSH
10.0000 mL | INTRAVENOUS | Status: DC | PRN
  Administered 2020-08-02: 10 mL via INTRAVENOUS
  Filled 2020-08-02: qty 10

## 2020-08-02 MED ORDER — SODIUM CHLORIDE 0.9 % IV SOLN
Freq: Once | INTRAVENOUS | Status: AC
Start: 2020-08-02 — End: 2020-08-02
  Filled 2020-08-02: qty 250

## 2020-08-02 MED ORDER — TRASTUZUMAB-ANNS CHEMO 420 MG IV SOLR
450.0000 mg | Freq: Once | INTRAVENOUS | Status: AC
  Administered 2020-08-02: 450 mg via INTRAVENOUS
  Filled 2020-08-02: qty 21.43

## 2020-08-02 MED ORDER — SODIUM CHLORIDE 0.9 % IV SOLN
420.0000 mg | Freq: Once | INTRAVENOUS | Status: AC
  Administered 2020-08-02: 420 mg via INTRAVENOUS
  Filled 2020-08-02: qty 14

## 2020-08-02 MED ORDER — DIPHENHYDRAMINE HCL 25 MG PO CAPS
ORAL_CAPSULE | ORAL | Status: AC
  Filled 2020-08-02: qty 2

## 2020-08-02 MED ORDER — ACETAMINOPHEN 325 MG PO TABS
ORAL_TABLET | ORAL | Status: AC
  Filled 2020-08-02: qty 2

## 2020-08-02 MED ORDER — ACETAMINOPHEN 325 MG PO TABS
650.0000 mg | ORAL_TABLET | Freq: Once | ORAL | Status: AC
  Administered 2020-08-02: 650 mg via ORAL

## 2020-08-02 NOTE — Patient Instructions (Signed)

## 2020-08-02 NOTE — Telephone Encounter (Signed)
Pt last seen by Dr. Beasley.  

## 2020-08-02 NOTE — Telephone Encounter (Signed)
I can sometimes, depends on the paperwork.

## 2020-08-02 NOTE — Progress Notes (Signed)
Patient declined to stay for post-observation period following perjeta infusion. VS retaken and remained stable. Patient d/c in no acute distress.

## 2020-08-03 ENCOUNTER — Telehealth: Payer: Self-pay | Admitting: Podiatry

## 2020-08-03 NOTE — Telephone Encounter (Signed)
Per Phil @ [employer] [facility] the brace codes (L1970(needs auth) but N1833,P8251, L2210 x2 do not need auth covered @ 80% no deductible applies.out of pocket is 3300.00(met 1888.67) ref # [id number]...  Pending auth # [id number] per Otila Kluver, from 7.14.2022 for 60 days..  faxed records to [phone].Marland KitchenMarland Kitchen

## 2020-08-07 ENCOUNTER — Ambulatory Visit: Payer: Medicare PPO | Admitting: Obstetrics and Gynecology

## 2020-08-07 ENCOUNTER — Telehealth: Payer: Self-pay | Admitting: Podiatry

## 2020-08-07 NOTE — Telephone Encounter (Signed)
Received a call from [place] intake specialist  nurse Amy stating the brace was authorized from 7.14.2022 thru 10.11.12 auth # [id number]

## 2020-08-09 DIAGNOSIS — H31092 Other chorioretinal scars, left eye: Secondary | ICD-10-CM | POA: Diagnosis not present

## 2020-08-09 DIAGNOSIS — E113513 Type 2 diabetes mellitus with proliferative diabetic retinopathy with macular edema, bilateral: Secondary | ICD-10-CM | POA: Diagnosis not present

## 2020-08-09 DIAGNOSIS — H3582 Retinal ischemia: Secondary | ICD-10-CM | POA: Diagnosis not present

## 2020-08-10 ENCOUNTER — Other Ambulatory Visit: Payer: Self-pay | Admitting: *Deleted

## 2020-08-10 ENCOUNTER — Encounter: Payer: Self-pay | Admitting: Oncology

## 2020-08-15 ENCOUNTER — Ambulatory Visit: Payer: Medicare PPO | Admitting: Podiatry

## 2020-08-15 ENCOUNTER — Other Ambulatory Visit: Payer: Self-pay

## 2020-08-15 ENCOUNTER — Ambulatory Visit (INDEPENDENT_AMBULATORY_CARE_PROVIDER_SITE_OTHER): Payer: Medicare PPO

## 2020-08-15 DIAGNOSIS — M21372 Foot drop, left foot: Secondary | ICD-10-CM | POA: Diagnosis not present

## 2020-08-15 DIAGNOSIS — M2011 Hallux valgus (acquired), right foot: Secondary | ICD-10-CM | POA: Diagnosis not present

## 2020-08-15 DIAGNOSIS — M21611 Bunion of right foot: Secondary | ICD-10-CM | POA: Diagnosis not present

## 2020-08-15 DIAGNOSIS — M2041 Other hammer toe(s) (acquired), right foot: Secondary | ICD-10-CM | POA: Diagnosis not present

## 2020-08-15 DIAGNOSIS — M21371 Foot drop, right foot: Secondary | ICD-10-CM

## 2020-08-15 DIAGNOSIS — M19071 Primary osteoarthritis, right ankle and foot: Secondary | ICD-10-CM

## 2020-08-16 NOTE — Progress Notes (Addendum)
  Subjective:  Patient ID: Darlene [name], female    DOB: [birth year],  MRN: [id number]  Discuss revision bunion surgery, foot drop braces   70 y.o. female returns for follow-up with the above complaint. History confirmed with patient.  She has been doing well in the brace she has 1 ordered for the other foot which her neurologist has recommended.  He is here for x-rays today to discuss possible revision bunion correction on the right foot  Objective:  Physical Exam: warm, good capillary refill, no trophic changes or ulcerative lesions, normal DP and PT pulses and reduced sensation at plantar soles of feet and pulps of toes. Left Foot: bunion deformity noted and Hammertoes, preulcerative callus plantar hallux  , weakness in anterior compartment dorsiflexion 4/5 Right Foot: bunion deformity noted and Hammertoe deformity noted, incurvated right hallux nail with erythema, no paronychia, she has weakness in dorsiflexion 4/5  Assessment:   1. Hallux valgus with bunions, right   2. Hammertoe of right foot   3. Arthritis of first metatarsophalangeal (MTP) joint of right foot   4. Foot drop, left foot   5. Right foot drop      Plan:  Patient was evaluated and treated and all questions answered.   We again also discussed revision correction of her severe bunion deformity and hammertoes.  Discussed Lapidus arthrodesis versus a first metatarsal valvula joint arthrodesis.  We discussed the pros and cons of each of these.  I think this would be best to give her the best result to reduce risk of recurrence and get the toe straight as possible and realign the toes.  I discussed with her that I think fusion are probably the most predictable and having a satisfactory outcome without risk of recurrence.  Likely will need capsulotomy of the second MTPJ and hammertoe correction 3 and 4.  Reviewed the radiographs with her and discussed the actual procedure itself.  We discussed the risk benefits and  potential complications including but not limited to pain, swelling, infection, scar, numbness which may be temporary or permanent, chronic pain, stiffness, nerve pain or damage, wound healing problems, bone healing problems including delayed or non-union.  She is currently dealing with guidelines with a reasonable in her basement and she will let me know when she is ready for surgical intervention likely by the end of the year.  She will return to sign preoperative consent and presurgical planning visit when she is ready   She has a left foot drop brace which has helped quite a bit and we have scheduled and ordered a right foot drop brace as well as she does have some motor deficits, this was recommended as well by her neurologist. This brace is medically necessary for her to ambulate safely and maintain proper balance and she has done very well with it on the left foot and we will now get one for the right foot as well. She will be seen by our orthotist soon.    Return when ready to plan surgery.

## 2020-08-22 ENCOUNTER — Other Ambulatory Visit: Payer: Self-pay

## 2020-08-23 ENCOUNTER — Ambulatory Visit: Payer: Medicare PPO | Admitting: Internal Medicine

## 2020-08-23 ENCOUNTER — Encounter: Payer: Self-pay | Admitting: Internal Medicine

## 2020-08-23 VITALS — BP 108/64 | HR 54 | Temp 98.3°F | Ht 69.0 in | Wt 167.0 lb

## 2020-08-23 DIAGNOSIS — Z9989 Dependence on other enabling machines and devices: Secondary | ICD-10-CM

## 2020-08-23 DIAGNOSIS — I427 Cardiomyopathy due to drug and external agent: Secondary | ICD-10-CM

## 2020-08-23 DIAGNOSIS — E559 Vitamin D deficiency, unspecified: Secondary | ICD-10-CM

## 2020-08-23 DIAGNOSIS — Z794 Long term (current) use of insulin: Secondary | ICD-10-CM

## 2020-08-23 DIAGNOSIS — C50411 Malignant neoplasm of upper-outer quadrant of right female breast: Secondary | ICD-10-CM | POA: Diagnosis not present

## 2020-08-23 DIAGNOSIS — N184 Chronic kidney disease, stage 4 (severe): Secondary | ICD-10-CM | POA: Diagnosis not present

## 2020-08-23 DIAGNOSIS — Z9884 Bariatric surgery status: Secondary | ICD-10-CM

## 2020-08-23 DIAGNOSIS — Z780 Asymptomatic menopausal state: Secondary | ICD-10-CM

## 2020-08-23 DIAGNOSIS — I10 Essential (primary) hypertension: Secondary | ICD-10-CM

## 2020-08-23 DIAGNOSIS — E1140 Type 2 diabetes mellitus with diabetic neuropathy, unspecified: Secondary | ICD-10-CM | POA: Diagnosis not present

## 2020-08-23 DIAGNOSIS — G4733 Obstructive sleep apnea (adult) (pediatric): Secondary | ICD-10-CM | POA: Diagnosis not present

## 2020-08-23 DIAGNOSIS — Z13820 Encounter for screening for osteoporosis: Secondary | ICD-10-CM

## 2020-08-23 DIAGNOSIS — F3289 Other specified depressive episodes: Secondary | ICD-10-CM

## 2020-08-23 NOTE — Progress Notes (Signed)
New Patient Office Visit     This visit occurred during the SARS-CoV-2 public health emergency.  Safety protocols were in place, including screening questions prior to the visit, additional usage of staff PPE, and extensive cleaning of exam room while observing appropriate contact time as indicated for disinfecting solutions.    CC/Reason for Visit: Establish care, discuss chronic medical conditions Previous PCP: Yaakov Guthrie [facility] Last Visit: Unknown  HPI: Darlene [name] is a 70 y.o. female who is coming in today for the above mentioned reasons.  She has an extensive past medical history: She has a history of hypertension, aortic atherosclerosis, cardiomyopathy secondary to chemotherapeutic agent, she has a history of breast cancer status post right-sided mastectomy, chemotherapy, radiation with what appears to be possible biochemical recurrence, scheduled to see oncology in 2 weeks.  She has a history of obstructive sleep apnea on CPAP.  Stage IV chronic kidney disease presumably due to diabetes, type 2 diabetes that has been well controlled, hyperlipidemia, depression.  She has significant peripheral neuropathy secondary to chemotherapy.  She started to have balance issues and is walking with a cane.  She has a history of obesity and is status post a Roux-en-Y gastric bypass in 2014.  [facility]  She is here mainly to establish care.  She has no acute concerns or complaints today.  Her oncologist is Dr. Jana Hakim, obesity medicine specialist is Dr. Leafy Ro, her cardiologist is Dr. Aundra Dubin, neurologist is Dr. Posey Pronto, nephrologist is Dr. Johnney Ou, she also has a psychiatrist.  Today she is requesting a referral for DEXA scan be placed.  Past Medical/Surgical History: Past Medical History:  Diagnosis Date   Anemia    hx of - during 1st round chemo   Anxiety    Arthritis    in fingers, shoulders   Back pain    Balance  problem    Breast cancer ([place])    Breast cancer of upper-outer quadrant of right female breast ([place]) 07/22/2014   Bunion, right foot    CHF (congestive heart failure) (HCC)    Chronic kidney disease    creatininei levels high per pt    Clumsiness    Constipation    Depression    Diabetes mellitus without complication ([place])    00+ years type 2    Diabetic retinopathy ([place])    torn retina   Eating disorder    binge eating   Epistaxis 08/26/2014   Fatty liver    Foot drop, left foot    HCAP (healthcare-associated pneumonia) 12/18/2014   Heart murmur    never had any problems   History of radiation therapy 04/05/15-05/19/15   right chest wall was treated to 50.4 Gy in 28 fractions, right mastectomy scar was treated to 10 Gy in 5 fractions   Hyperlipidemia    Hypertension    Joint pain    Multiple food allergies    Neuromuscular disorder ([place])    diabetic neuropathy   Obesity    Obstructive sleep apnea on CPAP    does not use cpap all the time has lost 160 lbs    Ovarian cyst rupture    possible   Pneumonia    Pneumonia 11/2014   Radiation 04/05/15-05/19/15   right chest wall 50.4 Gy, mastectomy scar 10 Gy   Sleep apnea    Thyroid nodule 06/2014    Past Surgical History:  Procedure Laterality Date   APPENDECTOMY  BIOPSY THYROID Bilateral 06/2014   2 nodules - Benign    BREAST LUMPECTOMY WITH RADIOACTIVE SEED AND SENTINEL LYMPH NODE BIOPSY Right 12/27/2014   Procedure: BREAST LUMPECTOMY WITH RADIOACTIVE SEED AND SENTINEL LYMPH NODE BIOPSY;  Surgeon: Stark Klein, MD;  Location: [facility];  Service: General;  Laterality: Right;   BREAST REDUCTION SURGERY Bilateral 01/06/2015   Procedure: BILATERAL BREAST REDUCTION, RIGHT ONCOPLASTIC RECONSTRUCTION),LEFT BREAST REDUCTION FOR SYMMETRY;  Surgeon: [name], MD;  Location: [facility];  Service: Plastics;  Laterality: Bilateral;   BREATH TEK H PYLORI N/A 09/15/2012   Procedure: BREATH TEK H PYLORI;  Surgeon: Pedro Earls, MD;  Location: [facility];  Service: General;  Laterality: N/A;   BUNIONECTOMY Left 12/2013   CARPAL TUNNEL RELEASE Right    CARPAL TUNNEL RELEASE Left    CATARACT EXTRACTION     6/19   COLONOSCOPY     DUPUYTREN CONTRACTURE RELEASE     GASTRIC ROUX-EN-Y N/A 11/02/2012   Procedure: LAPAROSCOPIC ROUX-EN-Y GASTRIC;  Surgeon: Pedro Earls, MD;  [facility]: [facility];  Service: General;  Laterality: N/A;   IR GENERIC HISTORICAL  03/18/2016   IR US GUIDE VASC ACCESS LEFT 03/18/2016 Markus Daft, MD [facility]   IR GENERIC HISTORICAL  03/18/2016   IR FLUORO GUIDE CV LINE LEFT 03/18/2016 Markus Daft, MD [facility]   LAPAROSCOPIC APPENDECTOMY N/A 07/22/2015   Procedure: APPENDECTOMY LAPAROSCOPIC;  Surgeon: Michael Boston, MD;  [facility];  Service: General;  Laterality: N/A;   MASTECTOMY Right 02/15/2015   modified   MODIFIED MASTECTOMY Right 02/15/2015   Procedure: RIGHT MODIFIED RADICAL MASTECTOMY;  Surgeon: Stark Klein, MD;  Location: [facility];  Service: General;  Laterality: Right;   ORIF WRIST FRACTURE Left 05/11/2019   Procedure: OPEN REDUCTION INTERNAL FIXATION (ORIF) left distal radius fracture;  Surgeon: Renette Butters, MD;  [facility];  Service: Orthopedics;  Laterality: Left;  with block   PORT-A-CATH REMOVAL Left 10/10/2015   Procedure: PORT REMOVAL;  Surgeon: Stark Klein, MD;  Location: [facility];  Service: General;  Laterality: Left;   PORTACATH PLACEMENT Left 08/02/2014   Procedure: INSERTION PORT-A-CATH;  Surgeon: Stark Klein, MD;  Location: [facility];  Service: General;  Laterality: Left;   PORTACATH PLACEMENT N/A 11/05/2016   Procedure: INSERTION PORT-A-CATH;  Surgeon: Stark Klein, MD;  Location: [facility];  Service: General;  Laterality: N/A;   RETINAL LASER PROCEDURE     retina tear on left eye   ROTATOR CUFF REPAIR Right 2005   SCAR REVISION Right 12/08/2015   Procedure: COMPLEX REPAIR RIGHT CHEST 10-15CM;  Surgeon: [name], MD;  Location:  [facility];  Service: Plastics;  Laterality: Right;   TOE SURGERY  1970s   bone spur   TRIGGER FINGER RELEASE     x3    Social History:  reports that she has never smoked. She has never used smokeless tobacco. She reports current alcohol use of about 1.0 - 2.0 standard drink of alcohol per week. She reports that she does not use drugs.  Allergies: Allergies  Allergen Reactions   Nsaids Other (See Comments)    H/o gastric bypass - avoid NSAIDs   Tape Hives, Rash and Other (See Comments)    Adhesives in bandaids    Family History:  Family History  Problem Relation Age of Onset   CVA Mother    Heart disease Mother    Sudden death Mother    Diabetes Father  Heart failure Father    Hypertension Father    Kidney disease Father    Obesity Father    Hypertension Sister    Diabetes Brother    Breast cancer Paternal Grandmother 44   Diabetes Paternal Uncle    Ovarian cancer Neg Hx      Current Outpatient Medications:    Accu-Chek Softclix Lancets lancets, 1 each by Other route 4 (four) times daily. Test blood sugars 4 x times daily, Disp: 200 each, Rfl: 1   aspirin EC 81 MG tablet, Take 1 tablet (81 mg total) by mouth daily., Disp: 90 tablet, Rfl: 3   Blood Glucose Calibration (ACCU-CHEK GUIDE CONTROL) LIQD, 1 Bottle by In Vitro route as needed., Disp: 1 each, Rfl: 0   Blood Glucose Monitoring Suppl (ACCU-CHEK GUIDE) w/Device KIT, 1 each by Does not apply route 4 (four) times daily. Use to test blood sugars 4x a day, Disp: 1 kit, Rfl: 0   buPROPion (WELLBUTRIN SR) 200 MG 12 hr tablet, Take one tablet by mouth once daily., Disp: 90 tablet, Rfl: 0   CALCIUM CITRATE-VITAMIN D PO, Take 1 tablet by mouth 3 (three) times daily. , Disp: , Rfl:    carvedilol (COREG) 6.25 MG tablet, Take 1 tablet (6.25 mg total) by mouth 2 (two) times daily., Disp: 180 tablet, Rfl: 3   cholecalciferol (VITAMIN D) 1000 units tablet, Take 1,000 Units by mouth daily., Disp: , Rfl:     cholestyramine (QUESTRAN) 4 g packet, Take 1 packet (4 g total) by mouth 2 (two) times daily as needed., Disp: 10 each, Rfl: 12   Cyanocobalamin (VITAMIN B-12 PO), Take 1 tablet by mouth every 14 (fourteen) days., Disp: , Rfl:    dapagliflozin propanediol (FARXIGA) 10 MG TABS tablet, Take 1 tablet (10 mg total) by mouth daily before breakfast., Disp: 30 tablet, Rfl: 11   diphenhydrAMINE (BENADRYL) 25 MG tablet, Take 25 mg by mouth daily as needed for allergies or sleep. , Disp: , Rfl:    glucose blood (ACCU-CHEK GUIDE) test strip, Use Accu Chek test strips to check blood sugar 4 times daily. DX:E11.21, Disp: 400 each, Rfl: 0   insulin glargine (LANTUS SOLOSTAR) 100 UNIT/ML Solostar Pen, Inject 8 Units into the skin daily., Disp: 7 mL, Rfl: 3   liraglutide (VICTOZA) 18 MG/3ML SOPN, Pt to take 1.8 mg qam, Disp: 27 mL, Rfl: 0   loratadine (CLARITIN) 10 MG tablet, Take 10 mg by mouth as needed for allergies., Disp: , Rfl:    losartan (COZAAR) 100 MG tablet, Take 1 tablet (100 mg total) by mouth daily., Disp: 90 tablet, Rfl: 0   NEXLIZET 180-10 MG TABS, TAKE 1 TABLET BY MOUTH DAILY., Disp: 30 tablet, Rfl: 11   nystatin (MYCOSTATIN/NYSTOP) powder, Apply 1 application topically in the morning, at noon, in the evening, and at bedtime., Disp: 56 g, Rfl: 1   ondansetron (ZOFRAN) 4 MG tablet, Take 1 tablet (4 mg total) by mouth every 8 (eight) hours as needed for nausea or vomiting., Disp: 20 tablet, Rfl: 0   Pediatric Multivitamins-Iron (FLINTSTONES PLUS IRON) chewable tablet, Chew 1 tablet by mouth 2 (two) times daily., Disp: , Rfl:    Probiotic Product ([employer] PROBIOTIC PO), Take 1 capsule by mouth daily. , Disp: , Rfl:    prochlorperazine (COMPAZINE) 10 MG tablet, Take 1 tablet (10 mg total) by mouth every 6 (six) hours as needed for nausea or vomiting., Disp: 30 tablet, Rfl: 0   Propylene Glycol (SYSTANE BALANCE OP), Place 1-2 drops  into both eyes 3 (three) times daily as needed (for dry eyes). , Disp: , Rfl:     senna-docusate (SENOKOT-S) 8.6-50 MG tablet, Take 1 tablet by mouth daily as needed for moderate constipation. , Disp: , Rfl:    SURE COMFORT PEN NEEDLES 32G X 4 MM MISC, Use as instructed to inject insulin and Victoza daily., Disp: 200 each, Rfl: 0   topiramate (TOPAMAX) 25 MG tablet, Take 1 tablet (25 mg total) by mouth daily., Disp: 90 tablet, Rfl: 0   vortioxetine HBr (TRINTELLIX) 20 MG TABS tablet, Take 1 tablet (20 mg total) by mouth at bedtime., Disp: 90 tablet, Rfl: 0  Review of Systems:  Constitutional: Denies fever, chills, diaphoresis, appetite change and fatigue.  HEENT: Denies photophobia, eye pain, redness, hearing loss, ear pain, congestion, sore throat, rhinorrhea, sneezing, mouth sores, trouble swallowing, neck pain, neck stiffness and tinnitus.   Respiratory: Denies SOB, DOE, cough, chest tightness,  and wheezing.   Cardiovascular: Denies chest pain, palpitations and leg swelling.  Gastrointestinal: Denies nausea, vomiting, abdominal pain, diarrhea, constipation, blood in stool and abdominal distention.  Genitourinary: Denies dysuria, urgency, frequency, hematuria, flank pain and difficulty urinating.  Endocrine: Denies: hot or cold intolerance, sweats, changes in hair or nails, polyuria, polydipsia. Musculoskeletal: Denies myalgias, back pain, joint swelling, arthralgias and gait problem.  Skin: Denies pallor, rash and wound.  Neurological: Denies dizziness, seizures, syncope, weakness, light-headedness, numbness and headaches.  Hematological: Denies adenopathy. Easy bruising, personal or family bleeding history  Psychiatric/Behavioral: Denies suicidal ideation, mood changes, confusion, nervousness, sleep disturbance and agitation    Physical Exam: Vitals:   08/23/20 1337  BP: 108/64  Pulse: (!) 54  Temp: 98.3 F (36.8 C)  TempSrc: Oral  SpO2: 98%  Weight: 167 lb (75.8 kg)  Height: 5' 9" (1.753 m)   Body mass index is 24.66 kg/m.  Constitutional: NAD, calm,  comfortable, ambulates with a cane Eyes: PERRL, lids and conjunctivae normal ENMT: Mucous membranes are moist.  Respiratory: clear to auscultation bilaterally, no wheezing, no crackles. Normal respiratory effort. No accessory muscle use.  Cardiovascular: Regular rate and rhythm, no murmurs / rubs / gallops. No extremity edema.  Psychiatric: Normal judgment and insight. Alert and oriented x 3. Normal mood.    Impression and Plan:  Encounter for osteoporosis screening in asymptomatic postmenopausal patient  - Plan: DG Bone Density  Other depression -Mood is stable on Wellbutrin  OSA on CPAP -Noted  Type 2 diabetes mellitus with diabetic neuropathy, with long-term current use of insulin (HCC) -Recent A1c on June 14 was 5.3.  Essential hypertension -Well-controlled  Vitamin D deficiency -Recheck when she returns for physical.  Malignant neoplasm of upper-outer quadrant of right female breast, unspecified estrogen receptor status ([place]) -With possible biochemical recurrence, is scheduled to see her oncologist later this month.  Lap Roux Y Gastric Bypass Oct 2014 -Noted, she has reached her maintenance weight.  CKD (chronic kidney disease) stage 4, GFR 15-29 ml/min (HCC) -Last measured creatinine was 2.490 earlier this month, she follows with nephrology.  Cardiomyopathy secondary to drug [facility]) -Followed by cardiology, echo in February 2022 with an ejection fraction of 55 to 60% and grade 1 diastolic dysfunction.  Time spent: 38 minutes reviewing chart, interviewing and examining patient and formulating plan of care.    Patient Instructions  -Nice seeing you today!!  -Schedule follow up in 3 months for your physical. Please come in fasting that day.   Lelon Frohlich, MD [facility]

## 2020-08-23 NOTE — Patient Instructions (Signed)
-  Nice seeing you today!!  -Schedule follow up in 3 months for your physical. Please come in fasting that day. 

## 2020-08-30 ENCOUNTER — Inpatient Hospital Stay: Payer: Medicare PPO

## 2020-08-30 ENCOUNTER — Other Ambulatory Visit: Payer: Self-pay

## 2020-08-30 ENCOUNTER — Other Ambulatory Visit: Payer: Self-pay | Admitting: Oncology

## 2020-08-30 ENCOUNTER — Inpatient Hospital Stay: Payer: Medicare PPO | Attending: Oncology

## 2020-08-30 VITALS — BP 126/57 | HR 56 | Temp 98.1°F | Resp 18 | Wt 169.0 lb

## 2020-08-30 DIAGNOSIS — C787 Secondary malignant neoplasm of liver and intrahepatic bile duct: Secondary | ICD-10-CM

## 2020-08-30 DIAGNOSIS — C50811 Malignant neoplasm of overlapping sites of right female breast: Secondary | ICD-10-CM | POA: Diagnosis not present

## 2020-08-30 DIAGNOSIS — Z95828 Presence of other vascular implants and grafts: Secondary | ICD-10-CM

## 2020-08-30 DIAGNOSIS — Z5112 Encounter for antineoplastic immunotherapy: Secondary | ICD-10-CM | POA: Insufficient documentation

## 2020-08-30 DIAGNOSIS — C801 Malignant (primary) neoplasm, unspecified: Secondary | ICD-10-CM

## 2020-08-30 DIAGNOSIS — C50411 Malignant neoplasm of upper-outer quadrant of right female breast: Secondary | ICD-10-CM

## 2020-08-30 DIAGNOSIS — Z171 Estrogen receptor negative status [ER-]: Secondary | ICD-10-CM

## 2020-08-30 DIAGNOSIS — C50911 Malignant neoplasm of unspecified site of right female breast: Secondary | ICD-10-CM

## 2020-08-30 LAB — COMPREHENSIVE METABOLIC PANEL
ALT: 11 U/L (ref 0–44)
AST: 20 U/L (ref 15–41)
Albumin: 3.8 g/dL (ref 3.5–5.0)
Alkaline Phosphatase: 50 U/L (ref 38–126)
Anion gap: 8 (ref 5–15)
BUN: 44 mg/dL — ABNORMAL HIGH (ref 8–23)
CO2: 23 mmol/L (ref 22–32)
Calcium: 9.1 mg/dL (ref 8.9–10.3)
Chloride: 112 mmol/L — ABNORMAL HIGH (ref 98–111)
Creatinine, Ser: 2.15 mg/dL — ABNORMAL HIGH (ref 0.44–1.00)
GFR, Estimated: 25 mL/min — ABNORMAL LOW (ref >60)
Glucose, Bld: 82 mg/dL (ref 70–99)
Potassium: 4.3 mmol/L (ref 3.5–5.1)
Sodium: 143 mmol/L (ref 135–145)
Total Bilirubin: 0.5 mg/dL (ref 0.3–1.2)
Total Protein: 6.1 g/dL — ABNORMAL LOW (ref 6.5–8.1)

## 2020-08-30 LAB — CBC WITH DIFFERENTIAL/PLATELET
Abs Immature Granulocytes: 0.02 10*3/uL (ref 0.00–0.07)
Basophils Absolute: 0 10*3/uL (ref 0.0–0.1)
Basophils Relative: 0 %
Eosinophils Absolute: 0.1 10*3/uL (ref 0.0–0.5)
Eosinophils Relative: 3 %
HCT: 30.3 % — ABNORMAL LOW (ref 36.0–46.0)
Hemoglobin: 10.4 g/dL — ABNORMAL LOW (ref 12.0–15.0)
Immature Granulocytes: 0 %
Lymphocytes Relative: 22 %
Lymphs Abs: 1 10*3/uL (ref 0.7–4.0)
MCH: 32.6 pg (ref 26.0–34.0)
MCHC: 34.3 g/dL (ref 30.0–36.0)
MCV: 95 fL (ref 80.0–100.0)
Monocytes Absolute: 0.4 10*3/uL (ref 0.1–1.0)
Monocytes Relative: 8 %
Neutro Abs: 3 10*3/uL (ref 1.7–7.7)
Neutrophils Relative %: 67 %
Platelets: 165 10*3/uL (ref 150–400)
RBC: 3.19 MIL/uL — ABNORMAL LOW (ref 3.87–5.11)
RDW: 12.3 % (ref 11.5–15.5)
WBC: 4.5 10*3/uL (ref 4.0–10.5)
nRBC: 0 % (ref 0.0–0.2)

## 2020-08-30 LAB — CEA (IN HOUSE-CHCC): CEA (CHCC-In House): 35.5 ng/mL — ABNORMAL HIGH (ref 0.00–5.00)

## 2020-08-30 MED ORDER — DIPHENHYDRAMINE HCL 25 MG PO CAPS
ORAL_CAPSULE | ORAL | Status: AC
  Filled 2020-08-30: qty 1

## 2020-08-30 MED ORDER — SODIUM CHLORIDE 0.9% FLUSH
10.0000 mL | INTRAVENOUS | Status: DC | PRN
  Administered 2020-08-30: 10 mL via INTRAVENOUS
  Filled 2020-08-30: qty 10

## 2020-08-30 MED ORDER — SODIUM CHLORIDE 0.9 % IV SOLN
Freq: Once | INTRAVENOUS | Status: AC
  Filled 2020-08-30: qty 250

## 2020-08-30 MED ORDER — SODIUM CHLORIDE 0.9% FLUSH
10.0000 mL | INTRAVENOUS | Status: DC | PRN
  Administered 2020-08-30: 10 mL
  Filled 2020-08-30: qty 10

## 2020-08-30 MED ORDER — TRASTUZUMAB-ANNS CHEMO 420 MG IV SOLR
450.0000 mg | Freq: Once | INTRAVENOUS | Status: AC
  Administered 2020-08-30: 450 mg via INTRAVENOUS
  Filled 2020-08-30: qty 21.43

## 2020-08-30 MED ORDER — ACETAMINOPHEN 325 MG PO TABS
650.0000 mg | ORAL_TABLET | Freq: Once | ORAL | Status: AC
  Administered 2020-08-30: 650 mg via ORAL

## 2020-08-30 MED ORDER — SODIUM CHLORIDE 0.9 % IV SOLN
420.0000 mg | Freq: Once | INTRAVENOUS | Status: AC
  Administered 2020-08-30: 420 mg via INTRAVENOUS
  Filled 2020-08-30: qty 14

## 2020-08-30 MED ORDER — HEPARIN SOD (PORK) LOCK FLUSH 100 UNIT/ML IV SOLN
500.0000 [IU] | Freq: Once | INTRAVENOUS | Status: AC | PRN
  Administered 2020-08-30: 500 [IU]
  Filled 2020-08-30: qty 5

## 2020-08-30 MED ORDER — DIPHENHYDRAMINE HCL 25 MG PO CAPS
25.0000 mg | ORAL_CAPSULE | Freq: Once | ORAL | Status: AC
  Administered 2020-08-30: 25 mg via ORAL

## 2020-08-30 MED ORDER — ACETAMINOPHEN 325 MG PO TABS
ORAL_TABLET | ORAL | Status: AC
  Filled 2020-08-30: qty 2

## 2020-08-30 NOTE — Progress Notes (Signed)
Patient declined to stay for 30 minute post-perjeta observation. 

## 2020-08-30 NOTE — Patient Instructions (Signed)
[facility]  Discharge Instructions: Thank you for choosing [facility] to provide your oncology and hematology care.   If you have a lab appointment with the [facility], please go directly to the [facility] and check in at the registration area.   Wear comfortable clothing and clothing appropriate for easy access to any Portacath or PICC line.   We strive to give you quality time with your provider. You may need to reschedule your appointment if you arrive late (15 or more minutes).  Arriving late affects you and other patients whose appointments are after yours.  Also, if you miss three or more appointments without notifying the office, you may be dismissed from the clinic at the provider's discretion.      For prescription refill requests, have your pharmacy contact our office and allow 72 hours for refills to be completed.    Today you received the following chemotherapy and/or immunotherapy agents herceptin, perjeta     To help prevent nausea and vomiting after your treatment, we encourage you to take your nausea medication as directed.  BELOW ARE SYMPTOMS THAT SHOULD BE REPORTED IMMEDIATELY: *FEVER GREATER THAN 100.4 F (38 C) OR HIGHER *CHILLS OR SWEATING *NAUSEA AND VOMITING THAT IS NOT CONTROLLED WITH YOUR NAUSEA MEDICATION *UNUSUAL SHORTNESS OF BREATH *UNUSUAL BRUISING OR BLEEDING *URINARY PROBLEMS (pain or burning when urinating, or frequent urination) *BOWEL PROBLEMS (unusual diarrhea, constipation, pain near the anus) TENDERNESS IN MOUTH AND THROAT WITH OR WITHOUT PRESENCE OF ULCERS (sore throat, sores in mouth, or a toothache) UNUSUAL RASH, SWELLING OR PAIN  UNUSUAL VAGINAL DISCHARGE OR ITCHING   Items with * indicate a potential emergency and should be followed up as soon as possible or go to the Emergency Department if any problems should occur.  Please show the CHEMOTHERAPY ALERT CARD or IMMUNOTHERAPY ALERT CARD at  check-in to the Emergency Department and triage nurse.  Should you have questions after your visit or need to cancel or reschedule your appointment, please contact [facility]  Dept: [phone]  and follow the prompts.  Office hours are 8:00 a.m. to 4:30 p.m. Monday - Friday. Please note that voicemails left after 4:00 p.m. may not be returned until the following business day.  We are closed weekends and major holidays. You have access to a nurse at all times for urgent questions. Please call the main number to the clinic Dept: [phone] and follow the prompts.   For any non-urgent questions, you may also contact your provider using MyChart. We now offer e-Visits for anyone 38 and older to request care online for non-urgent symptoms. For details visit [email or web address].   Also download the MyChart app! Go to the app store, search "MyChart", open the app, select [facility], and log in with your MyChart username and password.  Due to Covid, a mask is required upon entering the hospital/clinic. If you do not have a mask, one will be given to you upon arrival. For doctor visits, patients may have 1 support person aged 6 or older with them. For treatment visits, patients cannot have anyone with them due to current Covid guidelines and our immunocompromised population.

## 2020-08-30 NOTE — Progress Notes (Signed)
I called Darlene [name] and let her know that her CEA has bumped up a bit.  I am going to set her up for repeat MRI of the liver as the first step in reassessing her situation.

## 2020-08-31 ENCOUNTER — Ambulatory Visit (INDEPENDENT_AMBULATORY_CARE_PROVIDER_SITE_OTHER)
Admission: RE | Admit: 2020-08-31 | Discharge: 2020-08-31 | Disposition: A | Discharge status: Home / Self Care | Payer: Medicare PPO | Source: Ambulatory Visit | Attending: Internal Medicine | Admitting: Internal Medicine

## 2020-08-31 DIAGNOSIS — Z13820 Encounter for screening for osteoporosis: Secondary | ICD-10-CM

## 2020-08-31 DIAGNOSIS — Z780 Asymptomatic menopausal state: Secondary | ICD-10-CM

## 2020-09-05 ENCOUNTER — Encounter: Payer: Self-pay | Admitting: Nurse Practitioner

## 2020-09-05 ENCOUNTER — Encounter: Payer: Self-pay | Admitting: Oncology

## 2020-09-05 ENCOUNTER — Encounter: Payer: Self-pay | Admitting: Internal Medicine

## 2020-09-06 ENCOUNTER — Encounter: Payer: Self-pay | Admitting: *Deleted

## 2020-09-06 DIAGNOSIS — H3582 Retinal ischemia: Secondary | ICD-10-CM | POA: Diagnosis not present

## 2020-09-06 DIAGNOSIS — E113513 Type 2 diabetes mellitus with proliferative diabetic retinopathy with macular edema, bilateral: Secondary | ICD-10-CM | POA: Diagnosis not present

## 2020-09-08 ENCOUNTER — Other Ambulatory Visit: Payer: Self-pay

## 2020-09-08 ENCOUNTER — Other Ambulatory Visit: Payer: Medicare PPO

## 2020-09-08 DIAGNOSIS — M21372 Foot drop, left foot: Secondary | ICD-10-CM | POA: Diagnosis not present

## 2020-09-08 DIAGNOSIS — M19071 Primary osteoarthritis, right ankle and foot: Secondary | ICD-10-CM | POA: Diagnosis not present

## 2020-09-08 DIAGNOSIS — M2011 Hallux valgus (acquired), right foot: Secondary | ICD-10-CM | POA: Diagnosis not present

## 2020-09-08 DIAGNOSIS — E1142 Type 2 diabetes mellitus with diabetic polyneuropathy: Secondary | ICD-10-CM | POA: Diagnosis not present

## 2020-09-13 ENCOUNTER — Other Ambulatory Visit: Payer: Self-pay

## 2020-09-13 ENCOUNTER — Ambulatory Visit (HOSPITAL_COMMUNITY)
Admission: RE | Admit: 2020-09-13 | Discharge: 2020-09-13 | Disposition: A | Discharge status: Home / Self Care | Payer: Medicare PPO | Source: Ambulatory Visit | Attending: Oncology | Admitting: Oncology

## 2020-09-13 DIAGNOSIS — C787 Secondary malignant neoplasm of liver and intrahepatic bile duct: Secondary | ICD-10-CM | POA: Diagnosis not present

## 2020-09-13 DIAGNOSIS — C50411 Malignant neoplasm of upper-outer quadrant of right female breast: Secondary | ICD-10-CM | POA: Insufficient documentation

## 2020-09-13 DIAGNOSIS — K839 Disease of biliary tract, unspecified: Secondary | ICD-10-CM | POA: Diagnosis not present

## 2020-09-13 DIAGNOSIS — K769 Liver disease, unspecified: Secondary | ICD-10-CM | POA: Diagnosis not present

## 2020-09-13 DIAGNOSIS — K449 Diaphragmatic hernia without obstruction or gangrene: Secondary | ICD-10-CM | POA: Diagnosis not present

## 2020-09-13 DIAGNOSIS — K8020 Calculus of gallbladder without cholecystitis without obstruction: Secondary | ICD-10-CM | POA: Diagnosis not present

## 2020-09-13 MED ORDER — GADOBUTROL 1 MMOL/ML IV SOLN
7.0000 mL | Freq: Once | INTRAVENOUS | Status: AC | PRN
  Administered 2020-09-13: 7 mL via INTRAVENOUS

## 2020-09-14 ENCOUNTER — Encounter: Payer: Self-pay | Admitting: Oncology

## 2020-09-14 ENCOUNTER — Other Ambulatory Visit: Payer: Self-pay | Admitting: Oncology

## 2020-09-14 DIAGNOSIS — C50411 Malignant neoplasm of upper-outer quadrant of right female breast: Secondary | ICD-10-CM

## 2020-09-14 DIAGNOSIS — C787 Secondary malignant neoplasm of liver and intrahepatic bile duct: Secondary | ICD-10-CM

## 2020-09-14 NOTE — Progress Notes (Signed)
Darlene [name]'s most recent MRI of the liver shows a new lesion which could be metastatic.  I sent her a note letting her know I am ordering a PET scan for further evaluation.  She already has follow-up with me on 08/09/2020

## 2020-09-20 ENCOUNTER — Other Ambulatory Visit (INDEPENDENT_AMBULATORY_CARE_PROVIDER_SITE_OTHER): Payer: Self-pay | Admitting: Family Medicine

## 2020-09-20 ENCOUNTER — Encounter (INDEPENDENT_AMBULATORY_CARE_PROVIDER_SITE_OTHER): Payer: Self-pay | Admitting: Family Medicine

## 2020-09-20 DIAGNOSIS — I10 Essential (primary) hypertension: Secondary | ICD-10-CM

## 2020-09-20 DIAGNOSIS — E1122 Type 2 diabetes mellitus with diabetic chronic kidney disease: Secondary | ICD-10-CM

## 2020-09-20 DIAGNOSIS — Z794 Long term (current) use of insulin: Secondary | ICD-10-CM

## 2020-09-20 NOTE — Telephone Encounter (Signed)
Pt last seen by Dr. Beasley.  

## 2020-09-20 NOTE — Telephone Encounter (Signed)
Last OV with Dr. Beasley 

## 2020-09-21 ENCOUNTER — Encounter (INDEPENDENT_AMBULATORY_CARE_PROVIDER_SITE_OTHER): Payer: Self-pay | Admitting: Family Medicine

## 2020-09-21 NOTE — Telephone Encounter (Signed)
Last OV with Dr. Beasley 

## 2020-09-22 ENCOUNTER — Other Ambulatory Visit (INDEPENDENT_AMBULATORY_CARE_PROVIDER_SITE_OTHER): Payer: Self-pay | Admitting: Family Medicine

## 2020-09-22 DIAGNOSIS — I10 Essential (primary) hypertension: Secondary | ICD-10-CM

## 2020-09-26 ENCOUNTER — Encounter (INDEPENDENT_AMBULATORY_CARE_PROVIDER_SITE_OTHER): Payer: Self-pay | Admitting: Family Medicine

## 2020-09-26 MED ORDER — LOSARTAN POTASSIUM 100 MG PO TABS: 100.0000 mg | ORAL_TABLET | Freq: Every day | ORAL | 0 refills | Status: DC

## 2020-09-26 NOTE — Telephone Encounter (Signed)
Pt last seen by Dr. Beasley.  

## 2020-09-26 NOTE — Telephone Encounter (Signed)
LAST APPOINTMENT DATE: 07/04/20 NEXT APPOINTMENT DATE: 10/05/20   [place], [name] [place] [address] Phone: [phone] Fax: [phone]  Patient is requesting a refill of the following medications: Requested Prescriptions   Pending Prescriptions Disp Refills   losartan (COZAAR) 100 MG tablet 90 tablet 0    Sig: Take 1 tablet (100 mg total) by mouth daily.    Date last filled: 07/04/20 Previously prescribed by [facility]  Lab Results  Component Value Date   HGBA1C 5.3 07/04/2020   HGBA1C 5.2 01/04/2020   HGBA1C 5.4 10/05/2019   Lab Results  Component Value Date   LDLCALC 18 10/25/2019   CREATININE 2.15 (H) 08/30/2020   Lab Results  Component Value Date   VD25OH 49.3 01/04/2020   VD25OH 43.6 10/05/2019   VD25OH 43.5 08/27/2018    BP Readings from Last 3 Encounters:  08/30/20 (!) 126/57  08/23/20 108/64  08/02/20 132/63

## 2020-09-26 NOTE — Telephone Encounter (Signed)
Refilled at today

## 2020-09-27 ENCOUNTER — Inpatient Hospital Stay: Payer: Medicare PPO | Attending: Oncology

## 2020-09-27 ENCOUNTER — Other Ambulatory Visit: Payer: Self-pay

## 2020-09-27 ENCOUNTER — Inpatient Hospital Stay: Payer: Medicare PPO | Admitting: Oncology

## 2020-09-27 ENCOUNTER — Inpatient Hospital Stay: Payer: Medicare PPO

## 2020-09-27 VITALS — BP 111/46 | HR 61 | Temp 97.7°F | Resp 18 | Ht 69.0 in | Wt 167.7 lb

## 2020-09-27 DIAGNOSIS — E860 Dehydration: Secondary | ICD-10-CM | POA: Insufficient documentation

## 2020-09-27 DIAGNOSIS — Z171 Estrogen receptor negative status [ER-]: Secondary | ICD-10-CM | POA: Insufficient documentation

## 2020-09-27 DIAGNOSIS — Z5112 Encounter for antineoplastic immunotherapy: Secondary | ICD-10-CM | POA: Diagnosis not present

## 2020-09-27 DIAGNOSIS — C50811 Malignant neoplasm of overlapping sites of right female breast: Secondary | ICD-10-CM | POA: Insufficient documentation

## 2020-09-27 DIAGNOSIS — Z95828 Presence of other vascular implants and grafts: Secondary | ICD-10-CM

## 2020-09-27 DIAGNOSIS — C50411 Malignant neoplasm of upper-outer quadrant of right female breast: Secondary | ICD-10-CM

## 2020-09-27 DIAGNOSIS — Z7189 Other specified counseling: Secondary | ICD-10-CM

## 2020-09-27 DIAGNOSIS — N184 Chronic kidney disease, stage 4 (severe): Secondary | ICD-10-CM | POA: Diagnosis not present

## 2020-09-27 DIAGNOSIS — C801 Malignant (primary) neoplasm, unspecified: Secondary | ICD-10-CM

## 2020-09-27 DIAGNOSIS — C787 Secondary malignant neoplasm of liver and intrahepatic bile duct: Secondary | ICD-10-CM | POA: Insufficient documentation

## 2020-09-27 DIAGNOSIS — E1140 Type 2 diabetes mellitus with diabetic neuropathy, unspecified: Secondary | ICD-10-CM | POA: Insufficient documentation

## 2020-09-27 DIAGNOSIS — C50911 Malignant neoplasm of unspecified site of right female breast: Secondary | ICD-10-CM

## 2020-09-27 LAB — CBC WITH DIFFERENTIAL/PLATELET
Abs Immature Granulocytes: 0.03 10*3/uL (ref 0.00–0.07)
Basophils Absolute: 0 10*3/uL (ref 0.0–0.1)
Basophils Relative: 0 %
Eosinophils Absolute: 0.1 10*3/uL (ref 0.0–0.5)
Eosinophils Relative: 2 %
HCT: 31.9 % — ABNORMAL LOW (ref 36.0–46.0)
Hemoglobin: 10.8 g/dL — ABNORMAL LOW (ref 12.0–15.0)
Immature Granulocytes: 1 %
Lymphocytes Relative: 23 %
Lymphs Abs: 1.2 10*3/uL (ref 0.7–4.0)
MCH: 32.5 pg (ref 26.0–34.0)
MCHC: 33.9 g/dL (ref 30.0–36.0)
MCV: 96.1 fL (ref 80.0–100.0)
Monocytes Absolute: 0.4 10*3/uL (ref 0.1–1.0)
Monocytes Relative: 7 %
Neutro Abs: 3.5 10*3/uL (ref 1.7–7.7)
Neutrophils Relative %: 67 %
Platelets: 191 10*3/uL (ref 150–400)
RBC: 3.32 MIL/uL — ABNORMAL LOW (ref 3.87–5.11)
RDW: 12.4 % (ref 11.5–15.5)
WBC: 5.2 10*3/uL (ref 4.0–10.5)
nRBC: 0 % (ref 0.0–0.2)

## 2020-09-27 LAB — COMPREHENSIVE METABOLIC PANEL
ALT: 10 U/L (ref 0–44)
AST: 19 U/L (ref 15–41)
Albumin: 3.7 g/dL (ref 3.5–5.0)
Alkaline Phosphatase: 53 U/L (ref 38–126)
Anion gap: 9 (ref 5–15)
BUN: 57 mg/dL — ABNORMAL HIGH (ref 8–23)
CO2: 22 mmol/L (ref 22–32)
Calcium: 8.9 mg/dL (ref 8.9–10.3)
Chloride: 108 mmol/L (ref 98–111)
Creatinine, Ser: 2.56 mg/dL — ABNORMAL HIGH (ref 0.44–1.00)
GFR, Estimated: 20 mL/min — ABNORMAL LOW (ref >60)
Glucose, Bld: 162 mg/dL — ABNORMAL HIGH (ref 70–99)
Potassium: 4 mmol/L (ref 3.5–5.1)
Sodium: 139 mmol/L (ref 135–145)
Total Bilirubin: 0.5 mg/dL (ref 0.3–1.2)
Total Protein: 6.2 g/dL — ABNORMAL LOW (ref 6.5–8.1)

## 2020-09-27 MED ORDER — LIDOCAINE-PRILOCAINE 2.5-2.5 % EX CREA: TOPICAL_CREAM | CUTANEOUS | 3 refills | Status: DC

## 2020-09-27 MED ORDER — DEXAMETHASONE 4 MG PO TABS: 8.0000 mg | ORAL_TABLET | Freq: Every day | ORAL | 1 refills | Status: DC

## 2020-09-27 MED ORDER — PROCHLORPERAZINE MALEATE 10 MG PO TABS: 10.0000 mg | ORAL_TABLET | Freq: Four times a day (QID) | ORAL | 1 refills | Status: DC | PRN

## 2020-09-27 MED ORDER — SODIUM CHLORIDE 0.9% FLUSH
10.0000 mL | INTRAVENOUS | Status: AC | PRN
  Administered 2020-09-27: 10 mL

## 2020-09-27 MED ORDER — SODIUM CHLORIDE 0.9% FLUSH
10.0000 mL | INTRAVENOUS | Status: DC | PRN
  Administered 2020-09-27: 10 mL via INTRAVENOUS

## 2020-09-27 MED ORDER — SODIUM CHLORIDE 0.9 % IV SOLN: INTRAVENOUS | Status: DC

## 2020-09-27 MED ORDER — HEPARIN SOD (PORK) LOCK FLUSH 100 UNIT/ML IV SOLN
500.0000 [IU] | INTRAVENOUS | Status: AC | PRN
  Administered 2020-09-27: 500 [IU]

## 2020-09-27 NOTE — Progress Notes (Addendum)
Darlene [name]  [phone] [phone]    ID: Marnell Mcdaniel DOB: [birth year]  MR#: [id number]  UVO#:[id number]  Patient Care Team: Isaac Bliss, Rayford Halsted, MD as PCP - General (Internal Medicine) Larey Dresser, MD as PCP - Advanced Heart Failure (Cardiology) Stark Klein, MD as Consulting Physician (General Surgery) Magrinat, Virgie Dad, MD as Consulting Physician (Oncology) Jacelyn Pi, MD as Consulting Physician (Endocrinology) [name], MD as Consulting Physician (Plastic Surgery) Gery Pray, MD as Consulting Physician (Radiation Oncology) Juanita Craver, MD as Consulting Physician (Gastroenterology) Alda Berthold, DO as Consulting Physician (Neurology) Starlyn Skeans, MD as Consulting Physician (Family Medicine) Sherlynn Stalls, MD as Consulting Physician (Ophthalmology) Salvadore Dom, MD as Consulting Physician (Obstetrics and Gynecology) Raina Mina, RPH-CPP (Pharmacist)  OTHER MD:   CHIEF COMPLAINT:  HER-2 positive, estrogen receptor negative breast cancer  CURRENT TREATMENT:  Trastuzumab; Pertuzumab   INTERVAL HISTORY:  Darlene [name] returns today for follow-up and treatment of her estrogen receptor negative but HER-2 amplified breast cancer.    Her CEA has been rising over the last few months and suddenly jumped to 35.5 on 08/30/2020. This prompted repeat liver MRI on 09/13/2020 showing: 3.4 cm lesion in subcapsular dome of right liver showing heterogeneous enhancement, no other suspicious liver lesions; 10 mm gastrohepatic ligament lymph node is new in interval.  She is scheduled for restaging PET scan on 10/03/2020.  She has been on Trastuzumab/Pertuzumab every 4 weeks.  She tolerates this well, specifically with no diarrhea problems.  Her most recent echocardiogram on 03/09/2020 showed an ejection fraction of 55-60%. This is stable from prior in 10/2019.  She also underwent bone density screening on  08/31/2020 showing a T-score of -1.3, which is considered osteopenic.  Lab Results  Component Value Date   CEA1 35.50 (H) 08/30/2020   CEA1 8.24 (H) 07/05/2020   CEA1 3.39 06/07/2020   CEA1 1.81 05/10/2020   CEA1 1.12 04/12/2020    REVIEW OF SYSTEMS: Darlene [name] tells me she had her teeth widened and this caused severe inflammation of her mouth so that she could not eat or drink for quite a while.  She lost quite a bit of weight.  She is now just beginning to be able to eat some solids and she is trying to drink what she can but she remains very dehydrated.  She now has ankle braces in both feet because of her peripheral neuropathy.  She has had no falls however since September.  Currently her physical therapy is on hold as is her bunion surgery since she does not know what were going to do regarding her new findings on the MRI of the liver.  Detailed review of systems was otherwise stable   COVID 19 VACCINATION STATUS: Status post Pfizer x2, with booster September 2021  BREAST CANCER HISTORY: From the original intake note:  "Darlene [name]" had screening mammography at her gynecologist suggestive of a possible abnormality in the right breast. However right diagnostic mammogram 04/12/2013 at [facility] was negative. More recently however, she noted a lump in her right breast and brought it to her gynecologist's attention. On 07/18/2014 the patient had bilateral diagnostic mammography with tomosynthesis at [facility]. The breast density was category B. In the right breast upper outer quadrant there was an area of focal asymmetry with indistinct margins.[name] Ultrasound was obtained and showed in addition to the mass in question, measuring 1.7 cm, an abnormal-appearing lymph node in the right axillary tail.  On 07/19/2014 the patient underwent biopsy  of the right breast massin question as well as a right axillary lymph node. Both were positive for invasive ductal carcinoma, grade 2, estrogen and progesterone receptor negative,  with an MIB-1 of 90%, and HER-2 pending. Incidentally the lymph node biopsy showed a lymphocytic inflammatory component but no lymph node architecture.  Her subsequent history is as detailed above.   PAST MEDICAL HISTORY: Past Medical History:  Diagnosis Date   Anemia    hx of - during 1st round chemo   Anxiety    Arthritis    in fingers, shoulders   Back pain    Balance problem    Breast cancer ([place])    Breast cancer of upper-outer quadrant of right female breast ([place]) 07/22/2014   Bunion, right foot    CHF (congestive heart failure) (HCC)    Chronic kidney disease    creatininei levels high per pt    Clumsiness    Constipation    Depression    Diabetes mellitus without complication ([place])    00+ years type 2    Diabetic retinopathy ([place])    torn retina   Eating disorder    binge eating   Epistaxis 08/26/2014   Fatty liver    Foot drop, left foot    HCAP (healthcare-associated pneumonia) 12/18/2014   Heart murmur    never had any problems   History of radiation therapy 04/05/15-05/19/15   right chest wall was treated to 50.4 Gy in 28 fractions, right mastectomy scar was treated to 10 Gy in 5 fractions   Hyperlipidemia    Hypertension    Joint pain    Multiple food allergies    Neuromuscular disorder ([place])    diabetic neuropathy   Obesity    Obstructive sleep apnea on CPAP    does not use cpap all the time has lost 160 lbs    Ovarian cyst rupture    possible   Pneumonia    Pneumonia 11/2014   Radiation 04/05/15-05/19/15   right chest wall 50.4 Gy, mastectomy scar 10 Gy   Sleep apnea    Thyroid nodule 06/2014    PAST SURGICAL HISTORY: Past Surgical History:  Procedure Laterality Date   APPENDECTOMY     BIOPSY THYROID Bilateral 06/2014   2 nodules - Benign    BREAST LUMPECTOMY WITH RADIOACTIVE SEED AND SENTINEL LYMPH NODE BIOPSY Right 12/27/2014   Procedure: BREAST LUMPECTOMY WITH RADIOACTIVE SEED AND SENTINEL LYMPH NODE BIOPSY;  Surgeon: Stark Klein, MD;  Location:  [place];  Service: General;  Laterality: Right;   BREAST REDUCTION SURGERY Bilateral 01/06/2015   Procedure: BILATERAL BREAST REDUCTION, RIGHT ONCOPLASTIC RECONSTRUCTION),LEFT BREAST REDUCTION FOR SYMMETRY;  Surgeon: [name], MD;  Location: [place];  Service: Plastics;  Laterality: Bilateral;   BREATH TEK H PYLORI N/A 09/15/2012   Procedure: BREATH TEK H PYLORI;  Surgeon: Pedro Earls, MD;  Location: [facility];  Service: General;  Laterality: N/A;   BUNIONECTOMY Left 12/2013   CARPAL TUNNEL RELEASE Right    CARPAL TUNNEL RELEASE Left    CATARACT EXTRACTION     6/19   COLONOSCOPY     DUPUYTREN CONTRACTURE RELEASE     GASTRIC ROUX-EN-Y N/A 11/02/2012   Procedure: LAPAROSCOPIC ROUX-EN-Y GASTRIC;  Surgeon: Pedro Earls, MD;  Location: [facility];  Service: General;  Laterality: N/A;   IR GENERIC HISTORICAL  03/18/2016   IR US GUIDE VASC ACCESS LEFT 03/18/2016 Markus Daft, MD [facility]   IR GENERIC HISTORICAL  03/18/2016  IR FLUORO GUIDE CV LINE LEFT 03/18/2016 Markus Daft, MD [facility]   LAPAROSCOPIC APPENDECTOMY N/A 07/22/2015   Procedure: APPENDECTOMY LAPAROSCOPIC;  Surgeon: Michael Boston, MD;  Location: [facility];  Service: General;  Laterality: N/A;   MASTECTOMY Right 02/15/2015   modified   MODIFIED MASTECTOMY Right 02/15/2015   Procedure: RIGHT MODIFIED RADICAL MASTECTOMY;  Surgeon: Stark Klein, MD;  Location: [place];  Service: General;  Laterality: Right;   ORIF WRIST FRACTURE Left 05/11/2019   Procedure: OPEN REDUCTION INTERNAL FIXATION (ORIF) left distal radius fracture;  Surgeon: Renette Butters, MD;  Location: [facility];  Service: Orthopedics;  Laterality: Left;  with block   PORT-A-CATH REMOVAL Left 10/10/2015   Procedure: PORT REMOVAL;  Surgeon: Stark Klein, MD;  Location: [place];  Service: General;  Laterality: Left;   PORTACATH PLACEMENT Left 08/02/2014   Procedure: INSERTION PORT-A-CATH;  Surgeon: Stark Klein, MD;  Location: [facility];   Service: General;  Laterality: Left;   PORTACATH PLACEMENT N/A 11/05/2016   Procedure: INSERTION PORT-A-CATH;  Surgeon: Stark Klein, MD;  Location: [place];  Service: General;  Laterality: N/A;   RETINAL LASER PROCEDURE     retina tear on left eye   ROTATOR CUFF REPAIR Right 2005   SCAR REVISION Right 12/08/2015   Procedure: COMPLEX REPAIR RIGHT CHEST 10-15CM;  Surgeon: [name], MD;  Location: [facility] [place];  Service: Plastics;  Laterality: Right;   TOE SURGERY  1970s   bone spur   TRIGGER FINGER RELEASE     x3    FAMILY HISTORY Family History  Problem Relation Age of Onset   CVA Mother    Heart disease Mother    Sudden death Mother    Diabetes Father    Heart failure Father    Hypertension Father    Kidney disease Father    Obesity Father    Hypertension Sister    Diabetes Brother    Breast cancer Paternal Grandmother 51   Diabetes Paternal Uncle    Ovarian cancer Neg Hx   The patient's father died from complications of diabetes at age 65. The patient's mother died at age 22 with a subarachnoid hemorrhage. The patient had one brother, one sister. The only breast cancer was the patient's paternal grandmother diagnosed in her 94s. There is no history of ovarian cancer in the family.   GYNECOLOGIC HISTORY:  Patient's last menstrual period was 10/22/2007.  menarche age 11, the patient is GX P0. She went through the change of life in 2011. She did not take hormone replacement.   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 Her husband [name] is a retired Visual merchandiser (he taught at [employer]). He was diagnosed with prostate cancer in 2021. In May 2020 they moved to [facility] in [place].  [name] has 2 children from an earlier marriage, Rockey Situ who teaches in [place], and Ralph Benavidez,  who works at the [employer] in in [place]. He has 1 grand son, aged 16. The patient and her husband are not church attenders.   ADVANCED DIRECTIVES:  Not in place   HEALTH MAINTENANCE: Social History   Tobacco Use   Smoking status: Never   Smokeless tobacco: Never  Vaping Use   Vaping Use: Never used  Substance Use Topics   Alcohol use: Yes    Alcohol/week:  1.0 - 2.0 standard drink    Types: 1 - 2 Standard drinks or equivalent per week    Comment: social   Drug use: No     Colonoscopy:  May 2016  PAP:  Bone density: 07/01/2016 at [facility] showed normal, with a T score of -0.7  Lipid panel:  Allergies  Allergen Reactions   Nsaids Other (See Comments)    H/o gastric bypass - avoid NSAIDs   Tape Hives, Rash and Other (See Comments)    Adhesives in bandaids    Current Outpatient Medications  Medication Sig Dispense Refill   Accu-Chek Softclix Lancets lancets 1 each by Other route 4 (four) times daily. Test blood sugars 4 x times daily 200 each 1   aspirin EC 81 MG tablet Take 1 tablet (81 mg total) by mouth daily. 90 tablet 3   Blood Glucose Calibration (ACCU-CHEK GUIDE CONTROL) LIQD 1 Bottle by In Vitro route as needed. 1 each 0   Blood Glucose Monitoring Suppl (ACCU-CHEK GUIDE) w/Device KIT 1 each by Does not apply route 4 (four) times daily. Use to test blood sugars 4x a day 1 kit 0   buPROPion (WELLBUTRIN SR) 200 MG 12 hr tablet Take one tablet by mouth once daily. 90 tablet 0   CALCIUM CITRATE-VITAMIN D PO Take 1 tablet by mouth 3 (three) times daily.      carvedilol (COREG) 6.25 MG tablet Take 1 tablet (6.25 mg total) by mouth 2 (two) times daily. 180 tablet 3   cholecalciferol (VITAMIN D) 1000 units tablet Take 1,000 Units by mouth daily.     cholestyramine (QUESTRAN) 4 g packet Take 1 packet (4 g total) by mouth 2 (two) times daily as needed. 10 each 12   Cyanocobalamin (VITAMIN B-12 PO) Take 1 tablet by mouth every 14 (fourteen) days.     dapagliflozin  propanediol (FARXIGA) 10 MG TABS tablet Take 1 tablet (10 mg total) by mouth daily before breakfast. 30 tablet 11   diphenhydrAMINE (BENADRYL) 25 MG tablet Take 25 mg by mouth daily as needed for allergies or sleep.      glucose blood (ACCU-CHEK GUIDE) test strip Use Accu Chek test strips to check blood sugar 4 times daily. DX:E11.21 400 each 0   insulin glargine (LANTUS SOLOSTAR) 100 UNIT/ML Solostar Pen Inject 8 Units into the skin daily. 7 mL 3   liraglutide (VICTOZA) 18 MG/3ML SOPN Pt to take 1.8 mg qam 27 mL 0   loratadine (CLARITIN) 10 MG tablet Take 10 mg by mouth as needed for allergies.     losartan (COZAAR) 100 MG tablet Take 1 tablet (100 mg total) by mouth daily. 90 tablet 0   NEXLIZET 180-10 MG TABS TAKE 1 TABLET BY MOUTH DAILY. 30 tablet 11   nystatin (MYCOSTATIN/NYSTOP) powder Apply 1 application topically in the morning, at noon, in the evening, and at bedtime. 56 g 1   ondansetron (ZOFRAN) 4 MG tablet Take 1 tablet (4 mg total) by mouth every 8 (eight) hours as needed for nausea or vomiting. 20 tablet 0   Pediatric Multivitamins-Iron (FLINTSTONES PLUS IRON) chewable tablet Chew 1 tablet by mouth 2 (two) times daily.     Probiotic Product ([employer]S PROBIOTIC PO) Take 1 capsule by mouth daily.      prochlorperazine (COMPAZINE) 10 MG tablet Take 1 tablet (10 mg total) by mouth every 6 (six) hours as needed for nausea or vomiting. 30 tablet 0   Propylene Glycol (SYSTANE BALANCE OP) Place 1-2 drops into both eyes  3 (three) times daily as needed (for dry eyes).      senna-docusate (SENOKOT-S) 8.6-50 MG tablet Take 1 tablet by mouth daily as needed for moderate constipation.      SURE COMFORT PEN NEEDLES 32G X 4 MM MISC Use as instructed to inject insulin and Victoza daily. 200 each 0   topiramate (TOPAMAX) 25 MG tablet Take 1 tablet (25 mg total) by mouth daily. 90 tablet 0   vortioxetine HBr (TRINTELLIX) 20 MG TABS tablet Take 1 tablet (20 mg total) by mouth at bedtime. 90 tablet 0   No  current facility-administered medications for this visit.   Facility-Administered Medications Ordered in Other Visits  Medication Dose Route Frequency Provider Last Rate Last Admin   0.9 %  sodium chloride infusion   Intravenous Continuous Elowen Debruyn, Virgie Dad, MD   Stopped at 09/27/20 1701    OBJECTIVE: White woman who appears stated age  70:   09/27/20 1418  BP: (!) 111/46  Pulse: 61  Resp: 18  Temp: 97.7 F (36.5 C)  SpO2: 97%     Filed Weights   09/27/20 1418  Weight: 167 lb 11.2 oz (76.1 kg)    Body mass index is 24.76 kg/m.     ECOG FS:1 - Symptomatic but completely ambulatory    Sclerae unicteric, EOMs intact Wearing a mask No cervical or supraclavicular adenopathy Lungs no rales or rhonchi Heart regular rate and rhythm Abd soft, nontender, positive bowel sounds MSK no focal spinal tenderness, no upper extremity lymphedema Neuro: nonfocal, well oriented, appropriate affect Breasts: Status post right mastectomy with no evidence of chest wall recurrence.  The left breast and both axillae are benign.   LAB RESULTS:  CMP     Component Value Date/Time   NA 139 09/27/2020 1336   NA 145 (H) 01/04/2020 0951   NA 140 01/07/2017 0758   K 4.0 09/27/2020 1336   K 4.1 01/07/2017 0758   CL 108 09/27/2020 1336   CO2 22 09/27/2020 1336   CO2 24 01/07/2017 0758   GLUCOSE 162 (H) 09/27/2020 1336   GLUCOSE 134 01/07/2017 0758   BUN 57 (H) 09/27/2020 1336   BUN 33 (H) 01/04/2020 0951   BUN 33.6 (H) 01/07/2017 0758   CREATININE 2.56 (H) 09/27/2020 1336   CREATININE 1.62 (H) 04/28/2018 0915   CREATININE 1.5 (H) 01/07/2017 0758   CALCIUM 8.9 09/27/2020 1336   CALCIUM 9.0 04/12/2020 0923   CALCIUM 9.3 01/07/2017 0758   PROT 6.2 (L) 09/27/2020 1336   PROT 6.3 01/04/2020 0951   PROT 6.7 01/07/2017 0758   ALBUMIN 3.7 09/27/2020 1336   ALBUMIN 4.3 01/04/2020 0951   ALBUMIN 3.6 01/07/2017 0758   AST 19 09/27/2020 1336   AST 43 (H) 04/28/2018 0915   AST 51 (H)  01/07/2017 0758   ALT 10 09/27/2020 1336   ALT 61 (H) 04/28/2018 0915   ALT 82 (H) 01/07/2017 0758   ALKPHOS 53 09/27/2020 1336   ALKPHOS 115 01/07/2017 0758   BILITOT 0.5 09/27/2020 1336   BILITOT 0.4 01/04/2020 0951   BILITOT 0.6 04/28/2018 0915   BILITOT 0.72 01/07/2017 0758   GFRNONAA 20 (L) 09/27/2020 1336   GFRNONAA 33 (L) 04/28/2018 0915   GFRNONAA 55 (L) 02/18/2013 0827   GFRAA 36 (L) 01/04/2020 0951   GFRAA 38 (L) 04/28/2018 0915   GFRAA 63 02/18/2013 0827    INo results found for: SPEP, UPEP  Lab Results  Component Value Date   WBC 5.2 09/27/2020  NEUTROABS 3.5 09/27/2020   HGB 10.8 (L) 09/27/2020   HCT 31.9 (L) 09/27/2020   MCV 96.1 09/27/2020   PLT 191 09/27/2020      Chemistry      Component Value Date/Time   NA 139 09/27/2020 1336   NA 145 (H) 01/04/2020 0951   NA 140 01/07/2017 0758   K 4.0 09/27/2020 1336   K 4.1 01/07/2017 0758   CL 108 09/27/2020 1336   CO2 22 09/27/2020 1336   CO2 24 01/07/2017 0758   BUN 57 (H) 09/27/2020 1336   BUN 33 (H) 01/04/2020 0951   BUN 33.6 (H) 01/07/2017 0758   CREATININE 2.56 (H) 09/27/2020 1336   CREATININE 1.62 (H) 04/28/2018 0915   CREATININE 1.5 (H) 01/07/2017 0758      Component Value Date/Time   CALCIUM 8.9 09/27/2020 1336   CALCIUM 9.0 04/12/2020 0923   CALCIUM 9.3 01/07/2017 0758   ALKPHOS 53 09/27/2020 1336   ALKPHOS 115 01/07/2017 0758   AST 19 09/27/2020 1336   AST 43 (H) 04/28/2018 0915   AST 51 (H) 01/07/2017 0758   ALT 10 09/27/2020 1336   ALT 61 (H) 04/28/2018 0915   ALT 82 (H) 01/07/2017 0758   BILITOT 0.5 09/27/2020 1336   BILITOT 0.4 01/04/2020 0951   BILITOT 0.6 04/28/2018 0915   BILITOT 0.72 01/07/2017 0758       No results found for: LABCA2  No components found for: [id number]  No results for input(s): INR in the last 168 hours.  Urinalysis    Component Value Date/Time   COLORURINE YELLOW 07/21/2015 [place] 07/21/2015 2328   LABSPEC 1.020 07/21/2015 2328    PHURINE 6.0 07/21/2015 2328   GLUCOSEU NEGATIVE 07/21/2015 2328   HGBUR TRACE (A) 07/21/2015 2328   BILIRUBINUR NEGATIVE 07/21/2015 2328   BILIRUBINUR neg 06/29/2013 [place] 07/21/2015 2328   PROTEINUR 30 (A) 07/21/2015 2328   UROBILINOGEN negative 06/29/2013 1017   NITRITE NEGATIVE 07/21/2015 2328   LEUKOCYTESUR NEGATIVE 07/21/2015 2328    STUDIES: MR LIVER W WO CONTRAST  Result Date: 09/14/2020 CLINICAL DATA:  Breast cancer.  Rising CEA. EXAM: MRI ABDOMEN WITHOUT AND WITH CONTRAST TECHNIQUE: Multiplanar multisequence MR imaging of the abdomen was performed both before and after the administration of intravenous contrast. CONTRAST:  10m GADAVIST GADOBUTROL 1 MMOL/ML IV SOLN COMPARISON:  05/07/2020 FINDINGS: Lower chest: Unremarkable. Hepatobiliary: 3.0 by 2.2 x 3.4 cm T2 hyperintense lesion is identified in the subcapsular dome of the right liver (axial T2 image 6 of series 4). Progressive in the interval since the prior study and in the same region of the tiny 6 mm abnormality seen on 09/20/2019 but not readily evident on the 05/07/2020 exam. After IV contrast administration, this lesion enhances heterogeneously with component of irregular peripheral enhancement along its inferior aspect. This lesion also restricts diffusion. No other suspicious liver lesion evident. Layering sludge and stones noted in the gallbladder. No intrahepatic or extrahepatic biliary dilation. Pancreas: No focal mass lesion. No dilatation of the main duct. No intraparenchymal cyst. No peripancreatic edema. Spleen: Tiny subcapsular foci of increased T2 signal intensity noted in the dome of the spleen that enhancement after IV contrast administration. There is a similar subcapsular focus of hypoenhancement in the inferior spleen. These are much too small to characterize but most likely benign. Adrenals/Urinary Tract: No right adrenal nodule or mass. Stable nodular thickening of the left adrenal gland.  Kidneys unremarkable. Stomach/Bowel: Tiny hiatal hernia. Status post gastric  bypass. Duodenum is normally positioned as is the ligament of Treitz. No small bowel or colonic dilatation within the visualized abdomen. Vascular/Lymphatic: No abdominal aortic aneurysm. 10 mm short axis gastrohepatic ligament lymph node seen on axial T2 haste image 21/28 is new in the interval. Upper normal lymph nodes in the hepatoduodenal ligament are stable. No retroperitoneal or mesenteric lymphadenopathy. Other:  No intraperitoneal free fluid. Musculoskeletal: No focal suspicious marrow enhancement within the visualized bony anatomy. IMPRESSION: 1. 3.0 by 2.2 x 3.4 cm lesion in the subcapsular dome of the right liver shows heterogeneous enhancement after IV contrast administration. Imaging features are concerning for metastatic disease. No other suspicious liver lesions evident. 2. 10 mm short axis gastrohepatic ligament lymph node is new in the interval. Metastatic disease not excluded. 3. Cholelithiasis. Electronically Signed   By: Misty Stanley M.D.   On: 09/14/2020 13:48   DG Bone Density  Result Date: 09/03/2020 Table formatting from the original result was not included. Date of study: 08/31/2020 Exam: DUAL X-RAY ABSORPTIOMETRY (DXA) FOR BONE MINERAL DENSITY (BMD) Instrument: [employer] Requesting Provider: PCP Indication: follow up for low BMD Comparison: none (please note that it is not possible to compare data from different instruments) Clinical data: Pt is a 70 y.o. female with previous history of fracture. Results:  Lumbar spine L1-L4 Femoral neck (FN) T-score  1.8 RFN: -1.3 LFN: -0.8 Assessment: By the [facility] Criteria for diagnosis based on bone density, this patient has Low Bone Density Z Score compares the patients bone density to age, sex, and race matched controls.  Compared to age, sex, and race matched controls, this patient's bone density is Above Average FRAX 10-year fracture risk calculator: 15.1 % for  any major fracture and 1.7 % for hip fracture.  Pharmacologic therapy is recommended if 10 year fracture risk is >20% for any major osteoporotic fracture or >3% for hip fracture.  Comments: the technical quality of the study is good. WHO criteria for diagnosis of osteoporosis in postmenopausal women and in men 81 y/o or older: - normal: T-score -1.0 to + 1.0 - osteopenia/low bone density: T-score between -2.5 and -1.0 - osteoporosis: T-score below -2.5 - severe osteoporosis: T-score below -2.5 with history of fragility fracture Note: although not part of the WHO classification, the presence of a fragility fracture, regardless of the T-score, should be considered diagnostic of osteoporosis, provided other causes for the fracture have been excluded. RECOMMENDATION: 1. All patients should optimize calcium and vitamin D intake. 2. Consider FDA-approved medical therapies in postmenopausal women and men aged 68 years and older, based on the following: a. A hip or vertebral(clinical or morphometric) fracture. b. T-Score of  -2.5 or less at the femoral neck , total hip or spine after appropriate evaluation to exclude secondary causes c. Low bone mass (T-score between -1.0 and -2.5 at the femoral neck or spine) and a 10 year probability of a hip fracture >3% or a 10 year probability of major osteoporosis-related fracture > 20% based on the [place]-adapted WHO algorithm d. Clinical judgement and/or patient preferences may indicate treatment for people with 10-year fracture probabilities above or below these levels Follow up BMD is recommended: 2 years Interpreted by : Mack Guise, MD [facility]      ASSESSMENT: 70 y.o. [place] woman status post right breast upper outer quadrant and right axillary lymph node biopsy 07/19/2014 for a cT2  pN1, stage IIB  invasive ductal carcinoma, grade 2, estrogen and progesterone receptor negative, with an MIB-1 of  90%, and HER-2 amplified  (1) neoadjuvant  chemotherapy started 08/08/2014, consisting of carboplatin, docetaxel, trastuzumab and pertuzumab given every 3 weeks 6, completed 11/21/2014  (a) breast MRI after initial 3 cycles shows a significant response  (b) breast MRI after 6 cycles shows a complete radiologic response   (2) trastuzumab continued through 10/02/2015 (to end of capecitabine therapy)  (a) echo 02/13/2015 shows an EF of 60-65%  (b)  echo 05/09/2015 shows an ejection fraction of 60-65%  (c) echocardiogram 06/27/2015 shows an ejection fraction of 55-60%.  (d) echocardiogram 01/22/2017 shows an ejection fraction of 55-60%  (3) right lumpectomy with sentinel lymph node biopsy on 12/27/14 showed a residual  ypT2 ypN1a, stage IIB invasive ductal carcinoma, grade 2, with repeat estrogen and progesterone receptors again negative  (4) status post right oncoplastic surgery (with left reduction mammoplasty) 01/06/2015 showing in the right breast multiple foci of intralymphatic carcinoma in random sections  (5)  Right modified radical mastectomy 02/15/2014 showed no residual invasive disease; there was DCIS but margins were negative; all 15 axillary lymph nodes were clear; ypTis ypN0  (a)  The patient has decided against reconstruction.  (6)  Postmastectomy radiation completed 05/18/2015 with capecitabine sensitization  (7) adjuvant capecitabine continued through 09/27/2015  (8) hepatic steatosis with increased fibrosis score (at risk for cirrhosis)  (9) thyroid biopsy (left lobe inferiorly) 2 shows benign tissue 06/28/2015  (10) LOCAL RECURRENCE:  (a) PET scan 12/06/2015 Notes a right retropectoral lymph node measuring 0.9 cm with an SUV max of 5.5  (b) repeat PET scan 03/11/2016 finds the right subpectoral lymph node to now measure 1.3 cm with an SUV max of 11.8.  (c) PET scan 05/07/2016 shows a significant response to T-DM 1  (d) PET scan 07/10/2017 is clear except for what appears to be a single new liver lesion   (11)  started T-DM1 03/21/2016, completed 8 doses 08/13/2016  (a) PET scan 08/19/2016 shows a good response but measurable residual disease  (b) PET scan on 01/16/2017 shows a continuing response  (12) maintenance trastuzumab started 09/03/2016--changed to every 28 days as of 01/28/2017  (a) repeat echocardiogram 07/08/2016 shows an ejection fraction of 60-65%.  (b) echocardiogram on 10/08/2016 demonstrates LVEF of 55-60% (followed by Dr. Haroldine Laws)  (c) echo 01/22/2017 shows an ejection fraction in the 55-60% range  (d) echo 04/24/2017 shows an ejection fraction in the 55-60% range (recommendation for echo every 6 months by Dr. Aundra Dubin)  (e) trastuzumab changed to T-DM 1 after trastuzumab dose 07/15/2017  METASTATIC DISEASE: July 2019 (13)  Radiation to the area of persistent disease in the right upper chest completed 10/30/2016   (14) T-DM 1 resumed 08/12/2017, repeated every 21 days  (a) baseline liver MRI 08/02/2017 shows a 1.4 cm mass, "hot" on PET 07/10/2017  (b) repeat liver MRI 11/18/2017 after 3 cycles of T-DM 1, shows slight enlargement  (c) T-DM 1 discontinued after 09/23/2017 dose  (15) trastuzumab resumed 11/18/2017, to be repeated every 4 weeks,   (a) changed back to every 3 weeks in 03/2018  (b) echo 09/18/2018 shows an ejection fraction in the 50-55% range.  (c) pertuzumab added with 10/06/2018 dose  (d) trastuzumab/pertuzumab changed to every 4 weeks after 01/19/2019 dose  (e) liver MRI 04/27/2019 shows mild sclerosis, no change in right hepatic lobe treated metastases, with no new lesions; prominent porta hepatis nodes were felt likely reactive  (f) abdominal ultrasound 06/28/2027 shows no focal liver lesions, no ascites  (g) liver MRI 09/20/2019 shows stable disease (0.6  cm).  (16) liver biopsy 11/03/2017 shows adenocarcinoma, most consistent with an upper gastrointestinal primary; this tumor was estrogen receptor and HER-2 negative  (a) GI workup for occult primary ([place])  negative  (b) PET 12/09/2017 shows no obvious primary.  There is a right subpectoral lymph node noted as well  (c) CA 27-29 and CA 19-9 uninformative; CEA 6.72 OCT 2019  (d) negative hepatitis B serologies May 2021   (17) started Abraxane on 12/111/2019, given days 1 and 8 on a 21 day cycle   (a) restaging with MRI liver 02/24/2018 shows a gratifying initial response  (b) PET scan 05/14/2018 shows resolution of the measurable disease in the liver  (c) liver MRI 09/14/2018 shows no change compared to prior PET and MRI.  (d) Abraxane discontinued after 09/01/2018 dose  (18) continues on pertuzumab and trastuzumab every 4 weeks (see #15 above)   (a) MRI of the abdomen 05/07/2020 shows no measurable disease  (b) repeat abdominal MRI 09/13/2020 shows a new 3.4 cm right subcapsular lower liver lesion (this measured 0.6 cm August 2021 and was not seen April 2022)  (c) PET scan 10/03/2020  (19) to start Enhertu 10/05/2020   PLAN: Darlene [name] is now 3 years out from definitive diagnosis of metastatic breast cancer.  The recent liver MRI showed and the rising CEA are consistent with the disease progression.  We are not to be able to obtain a biopsy of the liver mass in question because of its location.  She is already scheduled for a PET scan next week.  I expect the mass to be "hot" on the PET and for that reason we need to change treatment.  Certainly we could assume this is triple negative or at least no longer HER2 positive and moved to chemotherapy, but I would prefer to give Enhertu a try.  If after 2-3 cycles there is no evidence of response I think we would have to go to chemotherapy.  She is very much in favor of this plan.  As she will have her first dose of Enhertu on 10/05/2020 and I will see her that same day.  Accordingly we are stopping the Herceptin and Perjeta today.  Instead she will receive a liter of saline in light of her dehydration issues.  Total encounter time 35 minutes.Sarajane Jews  C. Cyris Maalouf, MD 09/27/20 5:50 PM Medical Oncology and Hematology [facility] [place], [place] [address] Tel. [phone]    Fax. [phone]   I, Wilburn Mylar, am acting as scribe for Dr. Virgie Dad. Sharlie Shreffler.  I, Lurline Del MD, have reviewed the above documentation for accuracy and completeness, and I agree with the above.   *Total Encounter Time as defined by the Centers for Medicare and Medicaid Services includes, in addition to the face-to-face time of a patient visit (documented in the note above) non-face-to-face time: obtaining and reviewing outside history, ordering and reviewing medications, tests or procedures, care coordination (communications with other health care professionals or caregivers) and documentation in the medical record.

## 2020-10-02 NOTE — Progress Notes (Signed)
70 y.o. G1P0010 Married White or Caucasian Not Hispanic or Latino female here for annual exam.  No vaginal bleeding. Sexually active without penetration, husband with prostate cancer and has ED. She has some vaginal dryness.    H/O breast cancer, s/p lumpectomy, chemo, mastectomy and radiation. H/O metastatic disease. Also with + liver biopsy, most c/w upper GI primary. GI w/u for occult primary.  She goes 1 x a month for maintenance infusions. Recent CEA was  abnormal. She had a PET scan this morning.   H/O neuropathy bilateral feet.   H/O DM, last HgbA1C was 5.3%  She has kidney disease.   Patient's last menstrual period was 10/22/2007.          Sexually active: Yes.    The current method of family planning is post menopausal status.    Exercising: No.  The patient does not participate in regular exercise at present. Smoker:  no  Health Maintenance: Pap:  07-16-17 neg HPV HR neg, 07-05-14 neg HPV HR neg  History of abnormal Pap:  no MMG:  patient has one in oct. None on file.  BMD:   09/03/20 low bone density, osteopenia, FRAX 15.1/1.7%  Colonoscopy: 2020 normal  TDaP:  02/06/16  Gardasil: n/a   reports that she has never smoked. She has never used smokeless tobacco. She reports current alcohol use of about 1.0 - 2.0 standard drink per week. She reports that she does not use drugs.  Past Medical History:  Diagnosis Date   Anemia    hx of - during 1st round chemo   Anxiety    Arthritis    in fingers, shoulders   Back pain    Balance problem    Breast cancer ([place])    Breast cancer of upper-outer quadrant of right female breast ([place]) 07/22/2014   Bunion, right foot    CHF (congestive heart failure) ([facility])    Chronic kidney disease    creatininei levels high per pt    Clumsiness    Constipation    Depression    Diabetes mellitus without complication ([place])    79+ years type 2    Diabetic retinopathy ([place])    torn retina   Eating disorder    binge eating   Epistaxis 08/26/2014    Fatty liver    Foot drop, left foot    HCAP (healthcare-associated pneumonia) 12/18/2014   Heart murmur    never had any problems   History of radiation therapy 04/05/15-05/19/15   right chest wall was treated to 50.4 Gy in 28 fractions, right mastectomy scar was treated to 10 Gy in 5 fractions   Hyperlipidemia    Hypertension    Joint pain    Multiple food allergies    Neuromuscular disorder ([place])    diabetic neuropathy   Obesity    Obstructive sleep apnea on CPAP    does not use cpap all the time has lost 160 lbs    Ovarian cyst rupture    possible   Pneumonia    Pneumonia 11/2014   Radiation 04/05/15-05/19/15   right chest wall 50.4 Gy, mastectomy scar 10 Gy   Sleep apnea    Thyroid nodule 06/2014    Past Surgical History:  Procedure Laterality Date   APPENDECTOMY     BIOPSY THYROID Bilateral 06/2014   2 nodules - Benign    BREAST LUMPECTOMY WITH RADIOACTIVE SEED AND SENTINEL LYMPH NODE BIOPSY Right 12/27/2014   Procedure: BREAST LUMPECTOMY WITH RADIOACTIVE SEED AND SENTINEL LYMPH  NODE BIOPSY;  Surgeon: Stark Klein, MD;  Location: [facility];  Service: General;  Laterality: Right;   BREAST REDUCTION SURGERY Bilateral 01/06/2015   Procedure: BILATERAL BREAST REDUCTION, RIGHT ONCOPLASTIC RECONSTRUCTION),LEFT BREAST REDUCTION FOR SYMMETRY;  Surgeon: [name], MD;  Location: [facility];  Service: Plastics;  Laterality: Bilateral;   BREATH TEK H PYLORI N/A 09/15/2012   Procedure: BREATH TEK H PYLORI;  Surgeon: Pedro Earls, MD;  Location: [facility];  Service: General;  Laterality: N/A;   BUNIONECTOMY Left 12/2013   CARPAL TUNNEL RELEASE Right    CARPAL TUNNEL RELEASE Left    CATARACT EXTRACTION     6/19   COLONOSCOPY     DUPUYTREN CONTRACTURE RELEASE     GASTRIC ROUX-EN-Y N/A 11/02/2012   Procedure: LAPAROSCOPIC ROUX-EN-Y GASTRIC;  Surgeon: Pedro Earls, MD;  Location: [facility];  Service: General;  Laterality: N/A;   IR GENERIC HISTORICAL  03/18/2016    IR US GUIDE VASC ACCESS LEFT 03/18/2016 Markus Daft, MD [facility]   IR GENERIC HISTORICAL  03/18/2016   IR FLUORO GUIDE CV LINE LEFT 03/18/2016 Markus Daft, MD [facility]   LAPAROSCOPIC APPENDECTOMY N/A 07/22/2015   Procedure: APPENDECTOMY LAPAROSCOPIC;  Surgeon: Michael Boston, MD;  Location: [facility];  Service: General;  Laterality: N/A;   MASTECTOMY Right 02/15/2015   modified   MODIFIED MASTECTOMY Right 02/15/2015   Procedure: RIGHT MODIFIED RADICAL MASTECTOMY;  Surgeon: Stark Klein, MD;  Location: [facility];  Service: General;  Laterality: Right;   ORIF WRIST FRACTURE Left 05/11/2019   Procedure: OPEN REDUCTION INTERNAL FIXATION (ORIF) left distal radius fracture;  Surgeon: Renette Butters, MD;  Location: [facility];  Service: Orthopedics;  Laterality: Left;  with block   PORT-A-CATH REMOVAL Left 10/10/2015   Procedure: PORT REMOVAL;  Surgeon: Stark Klein, MD;  Location: [facility];  Service: General;  Laterality: Left;   PORTACATH PLACEMENT Left 08/02/2014   Procedure: INSERTION PORT-A-CATH;  Surgeon: Stark Klein, MD;  Location: [facility];  Service: General;  Laterality: Left;   PORTACATH PLACEMENT N/A 11/05/2016   Procedure: INSERTION PORT-A-CATH;  Surgeon: Stark Klein, MD;  Location: [facility];  Service: General;  Laterality: N/A;   RETINAL LASER PROCEDURE     retina tear on left eye   ROTATOR CUFF REPAIR Right 2005   SCAR REVISION Right 12/08/2015   Procedure: COMPLEX REPAIR RIGHT CHEST 10-15CM;  Surgeon: [name], MD;  Location: [facility];  Service: Plastics;  Laterality: Right;   TOE SURGERY  1970s   bone spur   TRIGGER FINGER RELEASE     x3    Current Outpatient Medications  Medication Sig Dispense Refill   Accu-Chek Softclix Lancets lancets 1 each by Other route 4 (four) times daily. Test blood sugars 4 x times daily 200 each 1   aspirin EC 81 MG tablet Take 1 tablet (81 mg total) by mouth daily. 90 tablet 3   Blood Glucose Calibration (ACCU-CHEK  GUIDE CONTROL) LIQD 1 Bottle by In Vitro route as needed. 1 each 0   Blood Glucose Monitoring Suppl (ACCU-CHEK GUIDE) w/Device KIT 1 each by Does not apply route 4 (four) times daily. Use to test blood sugars 4x a day 1 kit 0   buPROPion (WELLBUTRIN SR) 200 MG 12 hr tablet Take one tablet by mouth once daily. 90 tablet 0   CALCIUM CITRATE-VITAMIN D PO Take 1 tablet by mouth 3 (three) times daily.      carvedilol (COREG) 6.25 MG  tablet Take 1 tablet (6.25 mg total) by mouth 2 (two) times daily. 180 tablet 3   cholecalciferol (VITAMIN D) 1000 units tablet Take 1,000 Units by mouth daily.     cholestyramine (QUESTRAN) 4 g packet Take 1 packet (4 g total) by mouth 2 (two) times daily as needed. 10 each 12   Cyanocobalamin (VITAMIN B-12 PO) Take 1 tablet by mouth every 14 (fourteen) days.     dapagliflozin propanediol (FARXIGA) 10 MG TABS tablet Take 1 tablet (10 mg total) by mouth daily before breakfast. 30 tablet 11   dexamethasone (DECADRON) 4 MG tablet Take 2 tablets (8 mg total) by mouth daily. Start the day after chemotherapy for 2 days. 30 tablet 1   diphenhydrAMINE (BENADRYL) 25 MG tablet Take 25 mg by mouth daily as needed for allergies or sleep.      glucose blood (ACCU-CHEK GUIDE) test strip Use Accu Chek test strips to check blood sugar 4 times daily. DX:E11.21 400 each 0   insulin glargine (LANTUS SOLOSTAR) 100 UNIT/ML Solostar Pen Inject 8 Units into the skin daily. 7 mL 3   lidocaine-prilocaine (EMLA) cream Apply to affected area once 30 g 3   liraglutide (VICTOZA) 18 MG/3ML SOPN Pt to take 1.8 mg qam 27 mL 0   loratadine (CLARITIN) 10 MG tablet Take 10 mg by mouth as needed for allergies.     losartan (COZAAR) 100 MG tablet Take 1 tablet (100 mg total) by mouth daily. 90 tablet 0   NEXLIZET 180-10 MG TABS TAKE 1 TABLET BY MOUTH DAILY. 30 tablet 11   nystatin (MYCOSTATIN/NYSTOP) powder Apply 1 application topically in the morning, at noon, in the evening, and at bedtime. 56 g 1    ondansetron (ZOFRAN) 4 MG tablet Take 1 tablet (4 mg total) by mouth every 8 (eight) hours as needed for nausea or vomiting. 20 tablet 0   Pediatric Multivitamins-Iron (FLINTSTONES PLUS IRON) chewable tablet Chew 1 tablet by mouth 2 (two) times daily.     Probiotic Product ([employer] PROBIOTIC PO) Take 1 capsule by mouth daily.      prochlorperazine (COMPAZINE) 10 MG tablet Take 1 tablet (10 mg total) by mouth every 6 (six) hours as needed (Nausea or vomiting). 30 tablet 1   Propylene Glycol (SYSTANE BALANCE OP) Place 1-2 drops into both eyes 3 (three) times daily as needed (for dry eyes).      senna-docusate (SENOKOT-S) 8.6-50 MG tablet Take 1 tablet by mouth daily as needed for moderate constipation.      SURE COMFORT PEN NEEDLES 32G X 4 MM MISC Use as instructed to inject insulin and Victoza daily. 200 each 0   topiramate (TOPAMAX) 25 MG tablet Take 1 tablet (25 mg total) by mouth daily. 90 tablet 0   vortioxetine HBr (TRINTELLIX) 20 MG TABS tablet Take 1 tablet (20 mg total) by mouth at bedtime. 90 tablet 0   No current facility-administered medications for this visit.    Family History  Problem Relation Age of Onset   CVA Mother    Heart disease Mother    Sudden death Mother    Diabetes Father    Heart failure Father    Hypertension Father    Kidney disease Father    Obesity Father    Hypertension Sister    Diabetes Brother    Breast cancer Paternal Grandmother 1   Diabetes Paternal Uncle    Ovarian cancer Neg Hx     Review of Systems  All other systems reviewed  and are negative.  Exam:   BP (!) 104/58   Pulse 72   Ht '5\' 9"'  (1.753 m)   Wt 168 lb (76.2 kg)   LMP 10/22/2007   SpO2 98%   BMI 24.81 kg/m   Weight change: '@WEIGHTCHANGE' @ Height:   Height: '5\' 9"'  (175.3 cm)  Ht Readings from Last 3 Encounters:  10/03/20 '5\' 9"'  (1.753 m)  09/27/20 '5\' 9"'  (1.753 m)  08/23/20 '5\' 9"'  (1.753 m)    General appearance: alert, cooperative and appears stated age Head: Normocephalic,  without obvious abnormality, atraumatic Neck: no adenopathy, supple, symmetrical, trachea midline and thyroid normal to inspection and palpation Breasts:  evidence of right mastectomy, no chest wall masses, evidence of left breast reduction, no masses or skin changes.  Abdomen: soft, non-tender; non distended,  no masses,  no organomegaly Extremities: extremities normal, atraumatic, no cyanosis or edema Skin: Skin color, texture, turgor normal. No rashes or lesions Lymph nodes: Cervical, supraclavicular, and axillary nodes normal. No abnormal inguinal nodes palpated Neurologic: Grossly normal   Pelvic: External genitalia:  no lesions              Urethra:  normal appearing urethra with no masses, tenderness or lesions              Bartholins and Skenes: normal                 Vagina: normal appearing vagina with normal color and discharge, no lesions              Cervix: no lesions               Bimanual Exam:  Uterus:  normal size, contour, position, consistency, mobility, non-tender              Adnexa: no mass, fullness, tenderness               Rectovaginal: Confirms               Anus:  normal sphincter tone, no lesions  Gae Dry chaperoned for the exam.  1. GYN exam for high-risk Medicare patient Discussed breast self exam Discussed calcium and vit D intake Labs with primary Mammogram in 10/22 DEXA utd Colonoscopy UTD  2. History of breast cancer H/O mets, followed by Dr Marjie Skiff

## 2020-10-03 ENCOUNTER — Ambulatory Visit (HOSPITAL_COMMUNITY)
Admission: RE | Admit: 2020-10-03 | Discharge: 2020-10-03 | Disposition: A | Discharge status: Home / Self Care | Payer: Medicare PPO | Source: Ambulatory Visit | Attending: Oncology | Admitting: Oncology

## 2020-10-03 ENCOUNTER — Other Ambulatory Visit: Payer: Self-pay

## 2020-10-03 ENCOUNTER — Encounter (HOSPITAL_COMMUNITY): Payer: Self-pay

## 2020-10-03 ENCOUNTER — Encounter: Payer: Self-pay | Admitting: Obstetrics and Gynecology

## 2020-10-03 ENCOUNTER — Ambulatory Visit (INDEPENDENT_AMBULATORY_CARE_PROVIDER_SITE_OTHER): Payer: Medicare PPO | Admitting: Obstetrics and Gynecology

## 2020-10-03 VITALS — BP 104/58 | HR 72 | Ht 69.0 in | Wt 168.0 lb

## 2020-10-03 DIAGNOSIS — C787 Secondary malignant neoplasm of liver and intrahepatic bile duct: Secondary | ICD-10-CM | POA: Insufficient documentation

## 2020-10-03 DIAGNOSIS — Z9884 Bariatric surgery status: Secondary | ICD-10-CM | POA: Insufficient documentation

## 2020-10-03 DIAGNOSIS — I700 Atherosclerosis of aorta: Secondary | ICD-10-CM | POA: Insufficient documentation

## 2020-10-03 DIAGNOSIS — Z853 Personal history of malignant neoplasm of breast: Secondary | ICD-10-CM

## 2020-10-03 DIAGNOSIS — N200 Calculus of kidney: Secondary | ICD-10-CM | POA: Insufficient documentation

## 2020-10-03 DIAGNOSIS — Z9189 Other specified personal risk factors, not elsewhere classified: Secondary | ICD-10-CM | POA: Diagnosis not present

## 2020-10-03 DIAGNOSIS — K8020 Calculus of gallbladder without cholecystitis without obstruction: Secondary | ICD-10-CM | POA: Diagnosis not present

## 2020-10-03 DIAGNOSIS — C50411 Malignant neoplasm of upper-outer quadrant of right female breast: Secondary | ICD-10-CM | POA: Insufficient documentation

## 2020-10-03 LAB — GLUCOSE, CAPILLARY: Glucose-Capillary: 85 mg/dL (ref 70–99)

## 2020-10-03 MED ORDER — FLUDEOXYGLUCOSE F - 18 (FDG) INJECTION
8.3200 | Freq: Once | INTRAVENOUS | Status: AC
  Administered 2020-10-03: 8.32 via INTRAVENOUS

## 2020-10-03 NOTE — Patient Instructions (Signed)
EXERCISE   We recommended that you start or continue a regular exercise program for good health. Physical activity is anything that gets your body moving, some is better than none. The CDC recommends 150 minutes per week of Moderate-Intensity Aerobic Activity and 2 or more days of Muscle Strengthening Activity.  Benefits of exercise are limitless: helps weight loss/weight maintenance, improves mood and energy, helps with depression and anxiety, improves sleep, tones and strengthens muscles, improves balance, improves bone density, protects from chronic conditions such as heart disease, high blood pressure and diabetes and so much more. To learn more visit: https://www.cdc.gov/physicalactivity/index.html  DIET: Good nutrition starts with a healthy diet of fruits, vegetables, whole grains, and lean protein sources. Drink plenty of water for hydration. Minimize empty calories, sodium, sweets. For more information about dietary recommendations visit: https://health.gov/our-work/nutrition-physical-activity/dietary-guidelines and https://www.myplate.gov/  ALCOHOL:  Women should limit their alcohol intake to no more than 7 drinks/beers/glasses of wine (combined, not each!) per week. Moderation of alcohol intake to this level decreases your risk of breast cancer and liver damage.  If you are concerned that you may have a problem, or your friends have told you they are concerned about your drinking, there are many resources to help. A well-known program that is free, effective, and available to all people all over the nation is Alcoholics Anonymous.  Check out this site to learn more: https://www.aa.org/   CALCIUM AND VITAMIN D:  Adequate intake of calcium and Vitamin D are recommended for bone health.  You should be getting between 1000-1200 mg of calcium and 800 units of Vitamin D daily between diet and supplements  PAP SMEARS:  Pap smears, to check for cervical cancer or precancers,  have traditionally been  done yearly, scientific advances have shown that most women can have pap smears less often.  However, every woman still should have a physical exam from her gynecologist every year. It will include a breast check, inspection of the vulva and vagina to check for abnormal growths or skin changes, a visual exam of the cervix, and then an exam to evaluate the size and shape of the uterus and ovaries. We will also provide age appropriate advice regarding health maintenance, like when you should have certain vaccines, screening for sexually transmitted diseases, bone density testing, colonoscopy, mammograms, etc.   MAMMOGRAMS:  All women over 40 years old should have a routine mammogram.   COLON CANCER SCREENING: Now recommend starting at age 45. At this time colonoscopy is not covered for routine screening until 50. There are take home tests that can be done between 45-49.   COLONOSCOPY:  Colonoscopy to screen for colon cancer is recommended for all women at age 50.  We know, you hate the idea of the prep.  We agree, BUT, having colon cancer and not knowing it is worse!!  Colon cancer so often starts as a polyp that can be seen and removed at colonscopy, which can quite literally save your life!  And if your first colonoscopy is normal and you have no family history of colon cancer, most women don't have to have it again for 10 years.  Once every ten years, you can do something that may end up saving your life, right?  We will be happy to help you get it scheduled when you are ready.  Be sure to check your insurance coverage so you understand how much it will cost.  It may be covered as a preventative service at no cost, but you should check   your particular policy.      Breast Self-Awareness Breast self-awareness means being familiar with how your breasts look and feel. It involves checking your breasts regularly and reporting any changes to your health care provider. Practicing breast self-awareness is  important. A change in your breasts can be a sign of a serious medical problem. Being familiar with how your breasts look and feel allows you to find any problems early, when treatment is more likely to be successful. All women should practice breast self-awareness, including women who have had breast implants. How to do a breast self-exam One way to learn what is normal for your breasts and whether your breasts are changing is to do a breast self-exam. To do a breast self-exam: Look for Changes  Remove all the clothing above your waist. Stand in front of a mirror in a room with good lighting. Put your hands on your hips. Push your hands firmly downward. Compare your breasts in the mirror. Look for differences between them (asymmetry), such as: Differences in shape. Differences in size. Puckers, dips, and bumps in one breast and not the other. Look at each breast for changes in your skin, such as: Redness. Scaly areas. Look for changes in your nipples, such as: Discharge. Bleeding. Dimpling. Redness. A change in position. Feel for Changes Carefully feel your breasts for lumps and changes. It is best to do this while lying on your back on the floor and again while sitting or standing in the shower or tub with soapy water on your skin. Feel each breast in the following way: Place the arm on the side of the breast you are examining above your head. Feel your breast with the other hand. Start in the nipple area and make  inch (2 cm) overlapping circles to feel your breast. Use the pads of your three middle fingers to do this. Apply light pressure, then medium pressure, then firm pressure. The light pressure will allow you to feel the tissue closest to the skin. The medium pressure will allow you to feel the tissue that is a little deeper. The firm pressure will allow you to feel the tissue close to the ribs. Continue the overlapping circles, moving downward over the breast until you feel your  ribs below your breast. Move one finger-width toward the center of the body. Continue to use the  inch (2 cm) overlapping circles to feel your breast as you move slowly up toward your collarbone. Continue the up and down exam using all three pressures until you reach your armpit.  Write Down What You Find  Write down what is normal for each breast and any changes that you find. Keep a written record with breast changes or normal findings for each breast. By writing this information down, you do not need to depend only on memory for size, tenderness, or location. Write down where you are in your menstrual cycle, if you are still menstruating. If you are having trouble noticing differences in your breasts, do not get discouraged. With time you will become more familiar with the variations in your breasts and more comfortable with the exam. How often should I examine my breasts? Examine your breasts every month. If you are breastfeeding, the best time to examine your breasts is after a feeding or after using a breast pump. If you menstruate, the best time to examine your breasts is 5-7 days after your period is over. During your period, your breasts are lumpier, and it may be more   difficult to notice changes. When should I see my health care provider? See your health care provider if you notice: A change in shape or size of your breasts or nipples. A change in the skin of your breast or nipples, such as a reddened or scaly area. Unusual discharge from your nipples. A lump or thick area that was not there before. Pain in your breasts. Anything that concerns you.  

## 2020-10-04 ENCOUNTER — Other Ambulatory Visit: Payer: Self-pay | Admitting: *Deleted

## 2020-10-04 DIAGNOSIS — C50411 Malignant neoplasm of upper-outer quadrant of right female breast: Secondary | ICD-10-CM

## 2020-10-04 DIAGNOSIS — Z171 Estrogen receptor negative status [ER-]: Secondary | ICD-10-CM

## 2020-10-04 MED FILL — Dexamethasone Sodium Phosphate Inj 100 MG/10ML: INTRAMUSCULAR | Qty: 1 | Status: AC

## 2020-10-05 ENCOUNTER — Ambulatory Visit (INDEPENDENT_AMBULATORY_CARE_PROVIDER_SITE_OTHER): Payer: Medicare PPO | Admitting: Family Medicine

## 2020-10-05 ENCOUNTER — Telehealth: Payer: Self-pay | Admitting: *Deleted

## 2020-10-05 ENCOUNTER — Inpatient Hospital Stay: Payer: Medicare PPO

## 2020-10-05 ENCOUNTER — Encounter (INDEPENDENT_AMBULATORY_CARE_PROVIDER_SITE_OTHER): Payer: Self-pay | Admitting: Family Medicine

## 2020-10-05 ENCOUNTER — Inpatient Hospital Stay (HOSPITAL_BASED_OUTPATIENT_CLINIC_OR_DEPARTMENT_OTHER): Payer: Medicare PPO | Admitting: Oncology

## 2020-10-05 ENCOUNTER — Other Ambulatory Visit: Payer: Self-pay

## 2020-10-05 VITALS — BP 98/60 | HR 70 | Temp 97.6°F | Ht 69.0 in | Wt 163.0 lb

## 2020-10-05 VITALS — BP 121/55 | HR 75 | Temp 97.7°F | Resp 16 | Ht 69.0 in | Wt 169.7 lb

## 2020-10-05 DIAGNOSIS — E860 Dehydration: Secondary | ICD-10-CM | POA: Diagnosis not present

## 2020-10-05 DIAGNOSIS — C50411 Malignant neoplasm of upper-outer quadrant of right female breast: Secondary | ICD-10-CM

## 2020-10-05 DIAGNOSIS — Z171 Estrogen receptor negative status [ER-]: Secondary | ICD-10-CM | POA: Diagnosis not present

## 2020-10-05 DIAGNOSIS — Z6833 Body mass index (BMI) 33.0-33.9, adult: Secondary | ICD-10-CM | POA: Diagnosis not present

## 2020-10-05 DIAGNOSIS — E669 Obesity, unspecified: Secondary | ICD-10-CM | POA: Diagnosis not present

## 2020-10-05 DIAGNOSIS — C50811 Malignant neoplasm of overlapping sites of right female breast: Secondary | ICD-10-CM | POA: Diagnosis not present

## 2020-10-05 DIAGNOSIS — F3289 Other specified depressive episodes: Secondary | ICD-10-CM

## 2020-10-05 DIAGNOSIS — Z794 Long term (current) use of insulin: Secondary | ICD-10-CM | POA: Diagnosis not present

## 2020-10-05 DIAGNOSIS — Z95828 Presence of other vascular implants and grafts: Secondary | ICD-10-CM

## 2020-10-05 DIAGNOSIS — C50911 Malignant neoplasm of unspecified site of right female breast: Secondary | ICD-10-CM

## 2020-10-05 DIAGNOSIS — E1122 Type 2 diabetes mellitus with diabetic chronic kidney disease: Secondary | ICD-10-CM

## 2020-10-05 DIAGNOSIS — C801 Malignant (primary) neoplasm, unspecified: Secondary | ICD-10-CM | POA: Diagnosis not present

## 2020-10-05 DIAGNOSIS — E1140 Type 2 diabetes mellitus with diabetic neuropathy, unspecified: Secondary | ICD-10-CM | POA: Diagnosis not present

## 2020-10-05 DIAGNOSIS — C787 Secondary malignant neoplasm of liver and intrahepatic bile duct: Secondary | ICD-10-CM

## 2020-10-05 DIAGNOSIS — Z7189 Other specified counseling: Secondary | ICD-10-CM

## 2020-10-05 DIAGNOSIS — Z5112 Encounter for antineoplastic immunotherapy: Secondary | ICD-10-CM | POA: Diagnosis not present

## 2020-10-05 DIAGNOSIS — E1121 Type 2 diabetes mellitus with diabetic nephropathy: Secondary | ICD-10-CM

## 2020-10-05 LAB — CBC WITH DIFFERENTIAL (CANCER CENTER ONLY)
Abs Immature Granulocytes: 0.02 10*3/uL (ref 0.00–0.07)
Basophils Absolute: 0 10*3/uL (ref 0.0–0.1)
Basophils Relative: 0 %
Eosinophils Absolute: 0.1 10*3/uL (ref 0.0–0.5)
Eosinophils Relative: 2 %
HCT: 30.2 % — ABNORMAL LOW (ref 36.0–46.0)
Hemoglobin: 10.2 g/dL — ABNORMAL LOW (ref 12.0–15.0)
Immature Granulocytes: 0 %
Lymphocytes Relative: 17 %
Lymphs Abs: 1.2 10*3/uL (ref 0.7–4.0)
MCH: 32.7 pg (ref 26.0–34.0)
MCHC: 33.8 g/dL (ref 30.0–36.0)
MCV: 96.8 fL (ref 80.0–100.0)
Monocytes Absolute: 0.5 10*3/uL (ref 0.1–1.0)
Monocytes Relative: 7 %
Neutro Abs: 5.1 10*3/uL (ref 1.7–7.7)
Neutrophils Relative %: 74 %
Platelet Count: 186 10*3/uL (ref 150–400)
RBC: 3.12 MIL/uL — ABNORMAL LOW (ref 3.87–5.11)
RDW: 12.4 % (ref 11.5–15.5)
WBC Count: 6.9 10*3/uL (ref 4.0–10.5)
nRBC: 0 % (ref 0.0–0.2)

## 2020-10-05 LAB — CMP (CANCER CENTER ONLY)
ALT: 12 U/L (ref 0–44)
AST: 21 U/L (ref 15–41)
Albumin: 3.7 g/dL (ref 3.5–5.0)
Alkaline Phosphatase: 48 U/L (ref 38–126)
Anion gap: 7 (ref 5–15)
BUN: 45 mg/dL — ABNORMAL HIGH (ref 8–23)
CO2: 22 mmol/L (ref 22–32)
Calcium: 8.8 mg/dL — ABNORMAL LOW (ref 8.9–10.3)
Chloride: 112 mmol/L — ABNORMAL HIGH (ref 98–111)
Creatinine: 2.38 mg/dL — ABNORMAL HIGH (ref 0.44–1.00)
GFR, Estimated: 22 mL/min — ABNORMAL LOW (ref >60)
Glucose, Bld: 143 mg/dL — ABNORMAL HIGH (ref 70–99)
Potassium: 4.4 mmol/L (ref 3.5–5.1)
Sodium: 141 mmol/L (ref 135–145)
Total Bilirubin: 0.4 mg/dL (ref 0.3–1.2)
Total Protein: 6.1 g/dL — ABNORMAL LOW (ref 6.5–8.1)

## 2020-10-05 LAB — CEA (IN HOUSE-CHCC): CEA (CHCC-In House): 82.63 ng/mL — ABNORMAL HIGH (ref 0.00–5.00)

## 2020-10-05 MED ORDER — DIPHENHYDRAMINE HCL 25 MG PO CAPS
25.0000 mg | ORAL_CAPSULE | Freq: Once | ORAL | Status: AC
  Administered 2020-10-05: 25 mg via ORAL
  Filled 2020-10-05: qty 1

## 2020-10-05 MED ORDER — VICTOZA 18 MG/3ML ~~LOC~~ SOPN: PEN_INJECTOR | SUBCUTANEOUS | 0 refills | Status: DC

## 2020-10-05 MED ORDER — DEXTROSE 5 % IV SOLN: Freq: Once | INTRAVENOUS | Status: AC

## 2020-10-05 MED ORDER — SODIUM CHLORIDE 0.9% FLUSH
10.0000 mL | INTRAVENOUS | Status: DC | PRN
  Administered 2020-10-05: 10 mL

## 2020-10-05 MED ORDER — ACETAMINOPHEN 325 MG PO TABS
650.0000 mg | ORAL_TABLET | Freq: Once | ORAL | Status: AC
  Administered 2020-10-05: 650 mg via ORAL
  Filled 2020-10-05: qty 2

## 2020-10-05 MED ORDER — BUPROPION HCL ER (SR) 200 MG PO TB12: ORAL_TABLET | ORAL | 0 refills | Status: DC

## 2020-10-05 MED ORDER — ACCU-CHEK SOFTCLIX LANCETS MISC: 1.0000 | Freq: Four times a day (QID) | 1 refills | Status: DC

## 2020-10-05 MED ORDER — VORTIOXETINE HBR 20 MG PO TABS: 20.0000 mg | ORAL_TABLET | Freq: Every day | ORAL | 0 refills | Status: DC

## 2020-10-05 MED ORDER — SODIUM CHLORIDE 0.9% FLUSH
10.0000 mL | Freq: Once | INTRAVENOUS | Status: AC
  Administered 2020-10-05: 10 mL

## 2020-10-05 MED ORDER — DEXTROSE 5 % IV SOLN
400.0000 mg | Freq: Once | INTRAVENOUS | Status: AC
  Administered 2020-10-05: 400 mg via INTRAVENOUS
  Filled 2020-10-05: qty 20

## 2020-10-05 MED ORDER — PALONOSETRON HCL INJECTION 0.25 MG/5ML
0.2500 mg | Freq: Once | INTRAVENOUS | Status: AC
  Administered 2020-10-05: 0.25 mg via INTRAVENOUS
  Filled 2020-10-05: qty 5

## 2020-10-05 MED ORDER — SODIUM CHLORIDE 0.9 % IV SOLN
10.0000 mg | Freq: Once | INTRAVENOUS | Status: AC
  Administered 2020-10-05: 10 mg via INTRAVENOUS
  Filled 2020-10-05: qty 10

## 2020-10-05 MED ORDER — CARVEDILOL 6.25 MG PO TABS: 6.2500 mg | ORAL_TABLET | Freq: Two times a day (BID) | ORAL | 3 refills | Status: DC

## 2020-10-05 MED ORDER — ACCU-CHEK GUIDE VI STRP: ORAL_STRIP | 0 refills | Status: DC

## 2020-10-05 MED ORDER — HEPARIN SOD (PORK) LOCK FLUSH 100 UNIT/ML IV SOLN
500.0000 [IU] | Freq: Once | INTRAVENOUS | Status: AC | PRN
  Administered 2020-10-05: 500 [IU]

## 2020-10-05 MED ORDER — LANTUS SOLOSTAR 100 UNIT/ML ~~LOC~~ SOPN: 8.0000 [IU] | PEN_INJECTOR | Freq: Every day | SUBCUTANEOUS | 3 refills | Status: DC

## 2020-10-05 NOTE — Progress Notes (Signed)
[place]  [phone] [phone]    ID: Darlene [name] DOB: [birth year]  MR#: [id number]  OMV#:[id number]  Patient Care Team: Isaac Bliss, Rayford Halsted, MD as PCP - General (Internal Medicine) Larey Dresser, MD as PCP - Advanced Heart Failure (Cardiology) Stark Klein, MD as Consulting Physician (General Surgery) Naziya Hegwood, Virgie Dad, MD as Consulting Physician (Oncology) Jacelyn Pi, MD as Consulting Physician (Endocrinology) [name], MD as Consulting Physician (Plastic Surgery) Gery Pray, MD as Consulting Physician (Radiation Oncology) Juanita Craver, MD as Consulting Physician (Gastroenterology) Alda Berthold, DO as Consulting Physician (Neurology) Starlyn Skeans, MD as Consulting Physician (Family Medicine) Sherlynn Stalls, MD as Consulting Physician (Ophthalmology) Salvadore Dom, MD as Consulting Physician (Obstetrics and Gynecology) Raina Mina, RPH-CPP (Pharmacist)  OTHER MD:   CHIEF COMPLAINT:  HER-2 positive, estrogen receptor negative breast cancer  CURRENT TREATMENT:  [facility]   INTERVAL HISTORY:  Darlene [name] returns today for follow-up and treatment of her estrogen receptor negative but HER-2 amplified breast cancer.  She is accompanied by her husband Darlene [name]  Since her last visit, she underwent restaging PET scan on 10/03/2020 showing: lesion of concern in lateral dome of right hepatic lobe is highly hypermetabolic with maximum SUV of 18.3; 1.1 cm lymph node along upper porta hepatis adjacent to caudate lobe with maximum SUV of 13.9.  Lab Results  Component Value Date   CEA1 82.63 (H) 10/05/2020   CEA1 35.50 (H) 08/30/2020   CEA1 8.24 (H) 07/05/2020   CEA1 3.39 06/07/2020   CEA1 1.81 05/10/2020    REVIEW OF SYSTEMS: Darlene [name] is generally very stable and has no symptoms or signs related to her progression in the liver.  She has a good appetite, normal taste, and no nausea or vomiting  problems.   COVID 19 VACCINATION STATUS: Status post [employer] x2, with booster September 2021   BREAST CANCER HISTORY: From the original intake note:  "Darlene [name]" had screening mammography at her gynecologist suggestive of a possible abnormality in the right breast. However right diagnostic mammogram 04/12/2013 at [facility] was negative. More recently however, she noted a lump in her right breast and brought it to her gynecologist's attention. On 07/18/2014 the patient had bilateral diagnostic mammography with tomosynthesis at [facility]. The breast density was category B. In the right breast upper outer quadrant there was an area of focal asymmetry with indistinct margins.[name] Ultrasound was obtained and showed in addition to the mass in question, measuring 1.7 cm, an abnormal-appearing lymph node in the right axillary tail.  On 07/19/2014 the patient underwent biopsy of the right breast massin question as well as a right axillary lymph node. Both were positive for invasive ductal carcinoma, grade 2, estrogen and progesterone receptor negative, with an MIB-1 of 90%, and HER-2 pending. Incidentally the lymph node biopsy showed a lymphocytic inflammatory component but no lymph node architecture.  Her subsequent history is as detailed above.   PAST MEDICAL HISTORY: Past Medical History:  Diagnosis Date   Anemia    hx of - during 1st round chemo   Anxiety    Arthritis    in fingers, shoulders   Back pain    Balance problem    Breast cancer ([place])    Breast cancer of upper-outer quadrant of right female breast ([place]) 07/22/2014   Bunion, right foot    CHF (congestive heart failure) (HCC)    Chronic kidney disease    creatininei levels high per pt    Clumsiness    Constipation  Depression    Diabetes mellitus without complication ([place])    19+ years type 2    Diabetic retinopathy ([place])    torn retina   Eating disorder    binge eating   Epistaxis 08/26/2014   Fatty liver    Foot drop, left foot    HCAP  (healthcare-associated pneumonia) 12/18/2014   Heart murmur    never had any problems   History of radiation therapy 04/05/15-05/19/15   right chest wall was treated to 50.4 Gy in 28 fractions, right mastectomy scar was treated to 10 Gy in 5 fractions   Hyperlipidemia    Hypertension    Joint pain    Multiple food allergies    Neuromuscular disorder ([place])    diabetic neuropathy   Obesity    Obstructive sleep apnea on CPAP    does not use cpap all the time has lost 160 lbs    Ovarian cyst rupture    possible   Pneumonia    Pneumonia 11/2014   Radiation 04/05/15-05/19/15   right chest wall 50.4 Gy, mastectomy scar 10 Gy   Sleep apnea    Thyroid nodule 06/2014    PAST SURGICAL HISTORY: Past Surgical History:  Procedure Laterality Date   APPENDECTOMY     BIOPSY THYROID Bilateral 06/2014   2 nodules - Benign    BREAST LUMPECTOMY WITH RADIOACTIVE SEED AND SENTINEL LYMPH NODE BIOPSY Right 12/27/2014   Procedure: BREAST LUMPECTOMY WITH RADIOACTIVE SEED AND SENTINEL LYMPH NODE BIOPSY;  Surgeon: Stark Klein, MD;  Location: [place];  Service: General;  Laterality: Right;   BREAST REDUCTION SURGERY Bilateral 01/06/2015   Procedure: BILATERAL BREAST REDUCTION, RIGHT ONCOPLASTIC RECONSTRUCTION),LEFT BREAST REDUCTION FOR SYMMETRY;  Surgeon: [name], MD;  Location: [place];  Service: Plastics;  Laterality: Bilateral;   BREATH TEK H PYLORI N/A 09/15/2012   Procedure: BREATH TEK H PYLORI;  Surgeon: Pedro Earls, MD;  Location: [facility];  Service: General;  Laterality: N/A;   BUNIONECTOMY Left 12/2013   CARPAL TUNNEL RELEASE Right    CARPAL TUNNEL RELEASE Left    CATARACT EXTRACTION     6/19   COLONOSCOPY     DUPUYTREN CONTRACTURE RELEASE     GASTRIC ROUX-EN-Y N/A 11/02/2012   Procedure: LAPAROSCOPIC ROUX-EN-Y GASTRIC;  Surgeon: Pedro Earls, MD;  Location: [facility];  Service: General;  Laterality: N/A;   IR GENERIC HISTORICAL  03/18/2016   IR US GUIDE VASC  ACCESS LEFT 03/18/2016 Markus Daft, MD [facility]   IR GENERIC HISTORICAL  03/18/2016   IR FLUORO GUIDE CV LINE LEFT 03/18/2016 Markus Daft, MD [facility]   LAPAROSCOPIC APPENDECTOMY N/A 07/22/2015   Procedure: APPENDECTOMY LAPAROSCOPIC;  Surgeon: Michael Boston, MD;  Location: [facility];  Service: General;  Laterality: N/A;   MASTECTOMY Right 02/15/2015   modified   MODIFIED MASTECTOMY Right 02/15/2015   Procedure: RIGHT MODIFIED RADICAL MASTECTOMY;  Surgeon: Stark Klein, MD;  Location: [facility];  Service: General;  Laterality: Right;   ORIF WRIST FRACTURE Left 05/11/2019   Procedure: OPEN REDUCTION INTERNAL FIXATION (ORIF) left distal radius fracture;  Surgeon: Renette Butters, MD;  Location: [facility];  Service: Orthopedics;  Laterality: Left;  with block   PORT-A-CATH REMOVAL Left 10/10/2015   Procedure: PORT REMOVAL;  Surgeon: Stark Klein, MD;  Location: [place];  Service: General;  Laterality: Left;   PORTACATH PLACEMENT Left 08/02/2014   Procedure: INSERTION PORT-A-CATH;  Surgeon: Stark Klein, MD;  Location: [place];  Service:  General;  Laterality: Left;   PORTACATH PLACEMENT N/A 11/05/2016   Procedure: INSERTION PORT-A-CATH;  Surgeon: Stark Klein, MD;  Location: [place];  Service: General;  Laterality: N/A;   RETINAL LASER PROCEDURE     retina tear on left eye   ROTATOR CUFF REPAIR Right 2005   SCAR REVISION Right 12/08/2015   Procedure: COMPLEX REPAIR RIGHT CHEST 10-15CM;  Surgeon: [name], MD;  Location: [place];  Service: Plastics;  Laterality: Right;   TOE SURGERY  1970s   bone spur   TRIGGER FINGER RELEASE     x3    FAMILY HISTORY Family History  Problem Relation Age of Onset   CVA Mother    Heart disease Mother    Sudden death Mother    Diabetes Father    Heart failure Father    Hypertension Father    Kidney disease Father    Obesity Father    Hypertension Sister    Diabetes Brother    Breast cancer Paternal Grandmother 87    Diabetes Paternal Uncle    Ovarian cancer Neg Hx   The patient's father died from complications of diabetes at age 67. The patient's mother died at age 13 with a subarachnoid hemorrhage. The patient had one brother, one sister. The only breast cancer was the patient's paternal grandmother diagnosed in her 87s. There is no history of ovarian cancer in the family.   GYNECOLOGIC HISTORY:  Patient's last menstrual period was 10/22/2007.  menarche age 44, the patient is GX P0. She went through the change of life in 2011. She did not take hormone replacement.   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 Her husband Darlene [name] is a retired Visual merchandiser (he taught at [employer]). He was diagnosed with prostate cancer in 2021. In May 2020 they moved to [facility] in [place].  Darlene [name] has 2 children from an earlier marriage, Rockey Situ who teaches in [place], and Corneshia Hines,  who works at the [employer] in in [place]. He has 1 grand son, aged 38. The patient and her husband are not church attenders.   ADVANCED DIRECTIVES:  Not in place   HEALTH MAINTENANCE: Social History   Tobacco Use   Smoking status: Never   Smokeless tobacco: Never  Vaping Use   Vaping Use: Never used  Substance Use Topics   Alcohol use: Yes    Alcohol/week: 1.0 - 2.0 standard drink    Types: 1 - 2 Standard drinks or equivalent per week    Comment: social   Drug use: No     Colonoscopy:  May 2016  PAP:  Bone density: 07/01/2016 at [facility] showed normal, with a T score of -0.7  Lipid panel:  Allergies  Allergen Reactions   Nsaids Other (See Comments)    H/o gastric bypass - avoid NSAIDs   Tape Hives, Rash and Other (See Comments)    Adhesives in bandaids    Current Outpatient Medications  Medication Sig Dispense Refill   Accu-Chek  Softclix Lancets lancets 1 each by Other route 4 (four) times daily. Test blood sugars 4 x times daily 200 each 1   aspirin EC 81 MG tablet Take 1 tablet (81 mg total) by mouth daily. 90 tablet 3   Blood Glucose Calibration (ACCU-CHEK GUIDE CONTROL) LIQD 1 Bottle by In Vitro route as needed.  1 each 0   Blood Glucose Monitoring Suppl (ACCU-CHEK GUIDE) w/Device KIT 1 each by Does not apply route 4 (four) times daily. Use to test blood sugars 4x a day 1 kit 0   buPROPion (WELLBUTRIN SR) 200 MG 12 hr tablet Take one tablet by mouth once daily. 90 tablet 0   CALCIUM CITRATE-VITAMIN D PO Take 1 tablet by mouth 3 (three) times daily.      carvedilol (COREG) 6.25 MG tablet Take 1 tablet (6.25 mg total) by mouth 2 (two) times daily. 180 tablet 3   cholecalciferol (VITAMIN D) 1000 units tablet Take 1,000 Units by mouth daily.     cholestyramine (QUESTRAN) 4 g packet Take 1 packet (4 g total) by mouth 2 (two) times daily as needed. 10 each 12   Cyanocobalamin (VITAMIN B-12 PO) Take 1 tablet by mouth every 14 (fourteen) days.     dapagliflozin propanediol (FARXIGA) 10 MG TABS tablet Take 1 tablet (10 mg total) by mouth daily before breakfast. 30 tablet 11   dexamethasone (DECADRON) 4 MG tablet Take 2 tablets (8 mg total) by mouth daily. Start the day after chemotherapy for 2 days. 30 tablet 1   diphenhydrAMINE (BENADRYL) 25 MG tablet Take 25 mg by mouth daily as needed for allergies or sleep.      glucose blood (ACCU-CHEK GUIDE) test strip Use Accu Chek test strips to check blood sugar 4 times daily. DX:E11.21 400 each 0   insulin glargine (LANTUS SOLOSTAR) 100 UNIT/ML Solostar Pen Inject 8 Units into the skin daily. 7 mL 3   lidocaine-prilocaine (EMLA) cream Apply to affected area once 30 g 3   liraglutide (VICTOZA) 18 MG/3ML SOPN Pt to take 1.8 mg qam.  Pt to increase dose to 1.2 for now to help with appetite. 27 mL 0   loratadine (CLARITIN) 10 MG tablet Take 10 mg by mouth as needed for allergies.      losartan (COZAAR) 100 MG tablet Take 1 tablet (100 mg total) by mouth daily. 90 tablet 0   NEXLIZET 180-10 MG TABS TAKE 1 TABLET BY MOUTH DAILY. 30 tablet 11   nystatin (MYCOSTATIN/NYSTOP) powder Apply 1 application topically in the morning, at noon, in the evening, and at bedtime. 56 g 1   ondansetron (ZOFRAN) 4 MG tablet Take 1 tablet (4 mg total) by mouth every 8 (eight) hours as needed for nausea or vomiting. 20 tablet 0   Pediatric Multivitamins-Iron (FLINTSTONES PLUS IRON) chewable tablet Chew 1 tablet by mouth 2 (two) times daily.     Probiotic Product ([employer]S PROBIOTIC PO) Take 1 capsule by mouth daily.      prochlorperazine (COMPAZINE) 10 MG tablet Take 1 tablet (10 mg total) by mouth every 6 (six) hours as needed (Nausea or vomiting). 30 tablet 1   Propylene Glycol (SYSTANE BALANCE OP) Place 1-2 drops into both eyes 3 (three) times daily as needed (for dry eyes).      senna-docusate (SENOKOT-S) 8.6-50 MG tablet Take 1 tablet by mouth daily as needed for moderate constipation.      SURE COMFORT PEN NEEDLES 32G X 4 MM MISC Use as instructed to inject insulin and Victoza daily. 200 each 0   vortioxetine HBr (TRINTELLIX) 20 MG TABS tablet Take 1 tablet (20 mg total) by mouth at bedtime. 90 tablet 0   No current facility-administered medications for this visit.   Facility-Administered Medications Ordered in Other Visits  Medication Dose Route Frequency Provider Last Rate Last Admin   sodium chloride flush (  NS) 0.9 % injection 10 mL  10 mL Intracatheter PRN Devarius Nelles, Virgie Dad, MD   10 mL at 10/05/20 1618    OBJECTIVE: White woman who appears stated age  Vitals:   10/05/20 1229  BP: (!) 121/55  Pulse: 75  Resp: 16  Temp: 97.7 F (36.5 C)  SpO2: 98%     Filed Weights   10/05/20 1229  Weight: 169 lb 11.2 oz (77 kg)    Body mass index is 25.06 kg/m.     ECOG FS:1 - Symptomatic but completely ambulatory    Sclerae unicteric, EOMs intact Wearing a mask No cervical or  supraclavicular adenopathy Lungs no rales or rhonchi Heart regular rate and rhythm Abd soft, nontender, positive bowel sounds MSK no focal spinal tenderness, no upper extremity lymphedema Neuro: nonfocal, well oriented, appropriate affect Breasts: Status post right mastectomy.  The left breast and both axillae are benign.   LAB RESULTS:  CMP     Component Value Date/Time   NA 141 10/05/2020 1203   NA 145 (H) 01/04/2020 0951   NA 140 01/07/2017 0758   K 4.4 10/05/2020 1203   K 4.1 01/07/2017 0758   CL 112 (H) 10/05/2020 1203   CO2 22 10/05/2020 1203   CO2 24 01/07/2017 0758   GLUCOSE 143 (H) 10/05/2020 1203   GLUCOSE 134 01/07/2017 0758   BUN 45 (H) 10/05/2020 1203   BUN 33 (H) 01/04/2020 0951   BUN 33.6 (H) 01/07/2017 0758   CREATININE 2.38 (H) 10/05/2020 1203   CREATININE 1.5 (H) 01/07/2017 0758   CALCIUM 8.8 (L) 10/05/2020 1203   CALCIUM 9.0 04/12/2020 0923   CALCIUM 9.3 01/07/2017 0758   PROT 6.1 (L) 10/05/2020 1203   PROT 6.3 01/04/2020 0951   PROT 6.7 01/07/2017 0758   ALBUMIN 3.7 10/05/2020 1203   ALBUMIN 4.3 01/04/2020 0951   ALBUMIN 3.6 01/07/2017 0758   AST 21 10/05/2020 1203   AST 51 (H) 01/07/2017 0758   ALT 12 10/05/2020 1203   ALT 82 (H) 01/07/2017 0758   ALKPHOS 48 10/05/2020 1203   ALKPHOS 115 01/07/2017 0758   BILITOT 0.4 10/05/2020 1203   BILITOT 0.72 01/07/2017 0758   GFRNONAA 22 (L) 10/05/2020 1203   GFRNONAA 55 (L) 02/18/2013 0827   GFRAA 36 (L) 01/04/2020 0951   GFRAA 38 (L) 04/28/2018 0915   GFRAA 63 02/18/2013 0827    INo results found for: SPEP, UPEP  Lab Results  Component Value Date   WBC 6.9 10/05/2020   NEUTROABS 5.1 10/05/2020   HGB 10.2 (L) 10/05/2020   HCT 30.2 (L) 10/05/2020   MCV 96.8 10/05/2020   PLT 186 10/05/2020      Chemistry      Component Value Date/Time   NA 141 10/05/2020 1203   NA 145 (H) 01/04/2020 0951   NA 140 01/07/2017 0758   K 4.4 10/05/2020 1203   K 4.1 01/07/2017 0758   CL 112 (H) 10/05/2020  1203   CO2 22 10/05/2020 1203   CO2 24 01/07/2017 0758   BUN 45 (H) 10/05/2020 1203   BUN 33 (H) 01/04/2020 0951   BUN 33.6 (H) 01/07/2017 0758   CREATININE 2.38 (H) 10/05/2020 1203   CREATININE 1.5 (H) 01/07/2017 0758      Component Value Date/Time   CALCIUM 8.8 (L) 10/05/2020 1203   CALCIUM 9.0 04/12/2020 0923   CALCIUM 9.3 01/07/2017 0758   ALKPHOS 48 10/05/2020 1203   ALKPHOS 115 01/07/2017 0758   AST 21 10/05/2020 1203  AST 51 (H) 01/07/2017 0758   ALT 12 10/05/2020 1203   ALT 82 (H) 01/07/2017 0758   BILITOT 0.4 10/05/2020 1203   BILITOT 0.72 01/07/2017 0758       No results found for: LABCA2  No components found for: [id number]  No results for input(s): INR in the last 168 hours.  Urinalysis    Component Value Date/Time   COLORURINE YELLOW 07/21/2015 [place] 07/21/2015 2328   LABSPEC 1.020 07/21/2015 2328   PHURINE 6.0 07/21/2015 2328   GLUCOSEU NEGATIVE 07/21/2015 2328   HGBUR TRACE (A) 07/21/2015 2328   BILIRUBINUR NEGATIVE 07/21/2015 2328   BILIRUBINUR neg 06/29/2013 [place] 07/21/2015 2328   PROTEINUR 30 (A) 07/21/2015 2328   UROBILINOGEN negative 06/29/2013 1017   NITRITE NEGATIVE 07/21/2015 2328   LEUKOCYTESUR NEGATIVE 07/21/2015 2328    STUDIES: MR LIVER W WO CONTRAST  Result Date: 09/14/2020 CLINICAL DATA:  Breast cancer.  Rising CEA. EXAM: MRI ABDOMEN WITHOUT AND WITH CONTRAST TECHNIQUE: Multiplanar multisequence MR imaging of the abdomen was performed both before and after the administration of intravenous contrast. CONTRAST:  107m GADAVIST GADOBUTROL 1 MMOL/ML IV SOLN COMPARISON:  05/07/2020 FINDINGS: Lower chest: Unremarkable. Hepatobiliary: 3.0 by 2.2 x 3.4 cm T2 hyperintense lesion is identified in the subcapsular dome of the right liver (axial T2 image 6 of series 4). Progressive in the interval since the prior study and in the same region of the tiny 6 mm abnormality seen on 09/20/2019 but not readily evident  on the 05/07/2020 exam. After IV contrast administration, this lesion enhances heterogeneously with component of irregular peripheral enhancement along its inferior aspect. This lesion also restricts diffusion. No other suspicious liver lesion evident. Layering sludge and stones noted in the gallbladder. No intrahepatic or extrahepatic biliary dilation. Pancreas: No focal mass lesion. No dilatation of the main duct. No intraparenchymal cyst. No peripancreatic edema. Spleen: Tiny subcapsular foci of increased T2 signal intensity noted in the dome of the spleen that enhancement after IV contrast administration. There is a similar subcapsular focus of hypoenhancement in the inferior spleen. These are much too small to characterize but most likely benign. Adrenals/Urinary Tract: No right adrenal nodule or mass. Stable nodular thickening of the left adrenal gland. Kidneys unremarkable. Stomach/Bowel: Tiny hiatal hernia. Status post gastric bypass. Duodenum is normally positioned as is the ligament of Treitz. No small bowel or colonic dilatation within the visualized abdomen. Vascular/Lymphatic: No abdominal aortic aneurysm. 10 mm short axis gastrohepatic ligament lymph node seen on axial T2 haste image 21/28 is new in the interval. Upper normal lymph nodes in the hepatoduodenal ligament are stable. No retroperitoneal or mesenteric lymphadenopathy. Other:  No intraperitoneal free fluid. Musculoskeletal: No focal suspicious marrow enhancement within the visualized bony anatomy. IMPRESSION: 1. 3.0 by 2.2 x 3.4 cm lesion in the subcapsular dome of the right liver shows heterogeneous enhancement after IV contrast administration. Imaging features are concerning for metastatic disease. No other suspicious liver lesions evident. 2. 10 mm short axis gastrohepatic ligament lymph node is new in the interval. Metastatic disease not excluded. 3. Cholelithiasis. Electronically Signed   By: EMisty StanleyM.D.   On: 09/14/2020 13:48    NM PET Image Restag (PS) Skull Base To Thigh  Result Date: 10/03/2020 CLINICAL DATA:  Subsequent treatment strategy for metastatic breast cancer. EXAM: NUCLEAR MEDICINE PET SKULL BASE TO THIGH TECHNIQUE: 8.2 mCi F-18 FDG was injected intravenously. Full-ring PET imaging was performed from the skull base to thigh after  the radiotracer. CT data was obtained and used for attenuation correction and anatomic localization. Fasting blood glucose: 85 mg/dl COMPARISON:  PET-CT from 05/12/2018, and abdominal MRI from 09/13/2020 FINDINGS: Mediastinal blood pool activity: SUV max 2.2 Liver activity: SUV max NA NECK: Symmetric glottic activity is considered physiologic. Physiologic paraspinal muscular activity. Incidental CT findings: Bilateral common carotid atherosclerotic calcification. CHEST: No significant abnormal hypermetabolic activity in this region. Incidental CT findings: Left Port-A-Cath tip: SVC. Coronary, aortic arch, and branch vessel atherosclerotic vascular disease. Prior right mastectomy and right axillary dissection. Mitral valve calcifications. 3 mm in diameter perifissural nodule along the right major fissure on image 40 series 8, no change from 05/12/2018, likely benign. 2 mm nonspecific subpleural nodule in the left upper lobe on image 27 series 8. Stable mild lingular scarring. ABDOMEN/PELVIS: The lesion of concern in the lateral dome of the right hepatic lobe is highly hypermetabolic with maximum SUV 18.3, compatible with malignancy. The 1.1 cm in short axis lymph node in between the caudate lobe and the right crus of the hemidiaphragm is highly hypermetabolic with maximum SUV 13.9, compatible with malignancy. Incidental CT findings: Cholelithiasis. Postoperative findings in the stomach. Atherosclerosis is present, including aortoiliac atherosclerotic disease. Bilateral nonobstructive nephrolithiasis. SKELETON: No significant abnormal hypermetabolic activity in this region. Incidental CT findings:  Thoracic spondylosis. IMPRESSION: 1. The lesion of concern in the lateral dome of the right hepatic lobe is highly hypermetabolic with maximum SUV of 18.3, compatible with malignancy. 2. There is also a 1.1 cm in short axis lymph node along the upper porta hepatis adjacent to the caudate lobe with maximum SUV of 13.9, compatible with malignancy. 3. Other imaging findings of potential clinical significance: Substantial systemic atherosclerosis. Aortic Atherosclerosis (ICD10-I70.0). Cholelithiasis. Gastric bypass. Bilateral nonobstructive nephrolithiasis. Electronically Signed   By: Van Clines M.D.   On: 10/03/2020 15:09      ASSESSMENT: 70 y.o. [place] woman status post right breast upper outer quadrant and right axillary lymph node biopsy 07/19/2014 for a cT2  pN1, stage IIB  invasive ductal carcinoma, grade 2, estrogen and progesterone receptor negative, with an MIB-1 of 90%, and HER-2 amplified  (1) neoadjuvant chemotherapy started 08/08/2014, consisting of carboplatin, docetaxel, trastuzumab and pertuzumab given every 3 weeks 6, completed 11/21/2014  (a) breast MRI after initial 3 cycles shows a significant response  (b) breast MRI after 6 cycles shows a complete radiologic response   (2) trastuzumab continued through 10/02/2015 (to end of capecitabine therapy)  (a) echo 02/13/2015 shows an EF of 60-65%  (b)  echo 05/09/2015 shows an ejection fraction of 60-65%  (c) echocardiogram 06/27/2015 shows an ejection fraction of 55-60%.  (d) echocardiogram 01/22/2017 shows an ejection fraction of 55-60%  (3) right lumpectomy with sentinel lymph node biopsy on 12/27/14 showed a residual  ypT2 ypN1a, stage IIB invasive ductal carcinoma, grade 2, with repeat estrogen and progesterone receptors again negative  (4) status post right oncoplastic surgery (with left reduction mammoplasty) 01/06/2015 showing in the right breast multiple foci of intralymphatic carcinoma in random sections  (5)  Right  modified radical mastectomy 02/15/2014 showed no residual invasive disease; there was DCIS but margins were negative; all 15 axillary lymph nodes were clear; ypTis ypN0  (a)  The patient has decided against reconstruction.  (6)  Postmastectomy radiation completed 05/18/2015 with capecitabine sensitization  (7) adjuvant capecitabine continued through 09/27/2015  (8) hepatic steatosis with increased fibrosis score (at risk for cirrhosis)  (9) thyroid biopsy (left lobe inferiorly) 2 shows benign tissue 06/28/2015  (10)  LOCAL RECURRENCE:  (a) PET scan 12/06/2015 Notes a right retropectoral lymph node measuring 0.9 cm with an SUV max of 5.5  (b) repeat PET scan 03/11/2016 finds the right subpectoral lymph node to now measure 1.3 cm with an SUV max of 11.8.  (c) PET scan 05/07/2016 shows a significant response to T-DM 1  (d) PET scan 07/10/2017 is clear except for what appears to be a single new liver lesion   (11) started T-DM1 03/21/2016, completed 8 doses 08/13/2016  (a) PET scan 08/19/2016 shows a good response but measurable residual disease  (b) PET scan on 01/16/2017 shows a continuing response  (12) maintenance trastuzumab started 09/03/2016--changed to every 28 days as of 01/28/2017  (a) repeat echocardiogram 07/08/2016 shows an ejection fraction of 60-65%.  (b) echocardiogram on 10/08/2016 demonstrates LVEF of 55-60% (followed by Dr. Haroldine Laws)  (c) echo 01/22/2017 shows an ejection fraction in the 55-60% range  (d) echo 04/24/2017 shows an ejection fraction in the 55-60% range (recommendation for echo every 6 months by Dr. Aundra Dubin)  (e) trastuzumab changed to T-DM 1 after trastuzumab dose 07/15/2017  METASTATIC DISEASE: July 2019 (13)  Radiation to the area of persistent disease in the right upper chest completed 10/30/2016   (14) T-DM 1 resumed 08/12/2017, repeated every 21 days  (a) baseline liver MRI 08/02/2017 shows a 1.4 cm mass, "hot" on PET 07/10/2017  (b) repeat liver MRI  11/18/2017 after 3 cycles of T-DM 1, shows slight enlargement  (c) T-DM 1 discontinued after 09/23/2017 dose  (15) trastuzumab resumed 11/18/2017, to be repeated every 4 weeks,   (a) changed back to every 3 weeks in 03/2018  (b) echo 09/18/2018 shows an ejection fraction in the 50-55% range.  (c) pertuzumab added with 10/06/2018 dose  (d) trastuzumab/pertuzumab changed to every 4 weeks after 01/19/2019 dose  (e) liver MRI 04/27/2019 shows mild sclerosis, no change in right hepatic lobe treated metastases, with no new lesions; prominent porta hepatis nodes were felt likely reactive  (f) abdominal ultrasound 06/28/2027 shows no focal liver lesions, no ascites  (g) liver MRI 09/20/2019 shows stable disease (0.6 cm).  (16) liver biopsy 11/03/2017 shows adenocarcinoma, most consistent with an upper gastrointestinal primary; this tumor was estrogen receptor and HER-2 negative  (a) GI workup for occult primary ([facility]) negative  (b) PET 12/09/2017 shows no obvious primary.  There is a right subpectoral lymph node noted as well  (c) CA 27-29 and CA 19-9 uninformative; CEA 6.72 OCT 2019  (d) negative hepatitis B serologies May 2021   (17) started Abraxane on 12/111/2019, given days 1 and 8 on a 21 day cycle   (a) restaging with MRI liver 02/24/2018 shows a gratifying initial response  (b) PET scan 05/14/2018 shows resolution of the measurable disease in the liver  (c) liver MRI 09/14/2018 shows no change compared to prior PET and MRI.  (d) Abraxane discontinued after 09/01/2018 dose  (18) continues on pertuzumab and trastuzumab every 4 weeks (see #15 above)   (a) MRI of the abdomen 05/07/2020 shows no measurable disease  (b) repeat abdominal MRI 09/13/2020 shows a new 3.4 cm right subcapsular lower liver lesion (this measured 0.6 cm August 2021 and was not seen April 2022)  (c) PET scan 10/03/2020  (19) to start Enhertu 10/05/2020   PLAN: Darlene [name] is a little over 3 years out now from definitive  diagnosis of metastatic breast cancer.  Her disease was well controlled but now we have PET scan documentation of a hot liver lesion and  a draining lymph node.  We are going to accordingly intensify treatment with Enhertu.  She has a very good understanding of the possible toxicities side effects and complications of this agent and is very eager to start.    We reviewed how to take her supportive medicines are giving her diabetes we discussed what she will do in case her blood sugar rises above 250.  Recently she had a Medrol Dosepak and she was advised to simply increase her long-acting insulin by 2 additional units on the days she was on steroids.  I asked her to follow her blood sugar 4 times daily for the next 2 days, call us if it rises above 250, and very likely we will ask her to increase the long-acting insulin as suggested.  She has no regular insulin at home.  If we do not document her response after 4 cycles she may need to have the area in the liver resected as I do not believe we will be able to biopsy it safely.  She will call us with any other problems that may develop before the next visit.  Total encounter time 30 minutes.Sarajane Jews C. Justun Anaya, MD 10/05/20 6:35 PM Medical Oncology and Hematology [facility] [place], [place] [address] Tel. [phone]    Fax. [phone]   I, Wilburn Mylar, am acting as scribe for Dr. Virgie Dad. Rowen Wilmer.  I, Lurline Del MD, have reviewed the above documentation for accuracy and completeness, and I agree with the above.   *Total Encounter Time as defined by the Centers for Medicare and Medicaid Services includes, in addition to the face-to-face time of a patient visit (documented in the note above) non-face-to-face time: obtaining and reviewing outside history, ordering and reviewing medications, tests or procedures, care coordination (communications with other health care professionals or caregivers)  and documentation in the medical record.

## 2020-10-05 NOTE — Telephone Encounter (Signed)
Per MD may proceed with enhertu therapy today with Echo from 03/09/2020. Pt is scheduled for echo on 10/10/2020.

## 2020-10-05 NOTE — Patient Instructions (Signed)
[name]  Discharge Instructions: ?Thank you for choosing [facility] to provide your oncology and hematology care.  ? ?If you have a lab appointment with the Cancer Center, please go directly to the Cancer Center and check in at the registration area. ?  ?Wear comfortable clothing and clothing appropriate for easy access to any Portacath or PICC line.  ? ?We strive to give you quality time with your provider. You may need to reschedule your appointment if you arrive late (15 or more minutes).  Arriving late affects you and other patients whose appointments are after yours.  Also, if you miss three or more appointments without notifying the office, you may be dismissed from the clinic at the provider?s discretion.    ?  ?For prescription refill requests, have your pharmacy contact our office and allow 72 hours for refills to be completed.   ? ?Today you received the following chemotherapy and/or immunotherapy agents: Enhertu.    ?  ?To help prevent nausea and vomiting after your treatment, we encourage you to take your nausea medication as directed. ? ?BELOW ARE SYMPTOMS THAT SHOULD BE REPORTED IMMEDIATELY: ?*FEVER GREATER THAN 100.4 F (38 ?C) OR HIGHER ?*CHILLS OR SWEATING ?*NAUSEA AND VOMITING THAT IS NOT CONTROLLED WITH YOUR NAUSEA MEDICATION ?*UNUSUAL SHORTNESS OF BREATH ?*UNUSUAL BRUISING OR BLEEDING ?*URINARY PROBLEMS (pain or burning when urinating, or frequent urination) ?*BOWEL PROBLEMS (unusual diarrhea, constipation, pain near the anus) ?TENDERNESS IN MOUTH AND THROAT WITH OR WITHOUT PRESENCE OF ULCERS (sore throat, sores in mouth, or a toothache) ?UNUSUAL RASH, SWELLING OR PAIN  ?UNUSUAL VAGINAL DISCHARGE OR ITCHING  ? ?Items with * indicate a potential emergency and should be followed up as soon as possible or go to the Emergency Department if any problems should occur. ? ?Please show the CHEMOTHERAPY ALERT CARD or IMMUNOTHERAPY ALERT CARD at check-in to  the Emergency Department and triage nurse. ? ?Should you have questions after your visit or need to cancel or reschedule your appointment, please contact [facility]  Dept: [phone]  and follow the prompts.  Office hours are 8:00 a.m. to 4:30 p.m. Monday - Friday. Please note that voicemails left after 4:00 p.m. may not be returned until the following business day.  We are closed weekends and major holidays. You have access to a nurse at all times for urgent questions. Please call the main number to the clinic Dept: [phone] and follow the prompts. ? ? ?For any non-urgent questions, you may also contact your provider using MyChart. We now offer e-Visits for anyone 18 and older to request care online for non-urgent symptoms. For details visit [email or web address]. ?  ?Also download the MyChart app! Go to the app store, search "MyChart", open the app, select [facility], and log in with your MyChart username and password. ? ?Due to Covid, a mask is required upon entering the hospital/clinic. If you do not have a mask, one will be given to you upon arrival. For doctor visits, patients may have 1 support person aged 18 or older with them. For treatment visits, patients cannot have anyone with them due to current Covid guidelines and our immunocompromised population.  ? ?
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
## 2020-10-09 ENCOUNTER — Other Ambulatory Visit: Payer: Self-pay

## 2020-10-09 ENCOUNTER — Encounter: Payer: Self-pay | Admitting: Dietician

## 2020-10-09 ENCOUNTER — Encounter: Payer: Medicare PPO | Attending: Family Medicine | Admitting: Dietician

## 2020-10-09 DIAGNOSIS — Z794 Long term (current) use of insulin: Secondary | ICD-10-CM | POA: Diagnosis not present

## 2020-10-09 DIAGNOSIS — E119 Type 2 diabetes mellitus without complications: Secondary | ICD-10-CM | POA: Insufficient documentation

## 2020-10-09 DIAGNOSIS — N184 Chronic kidney disease, stage 4 (severe): Secondary | ICD-10-CM | POA: Insufficient documentation

## 2020-10-09 NOTE — Patient Instructions (Addendum)
Consider ways to increase your vegetable and fruit intake.  1 fruit per meal (or have this at a snack if you miss)  1 vegetable at lunch and dinner Carbohydrate (starch choices at each meal)  2 choices per meal  Increase your protein to 75 grams per day.  1 ounce protein at breakfast  3 ounces for lunch and dinner  Incorporate simple exercises into your day.  Be mindful of foods that worsen your heartburn. Eat dinner 3 hours before bed
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
## 2020-10-10 ENCOUNTER — Ambulatory Visit (HOSPITAL_BASED_OUTPATIENT_CLINIC_OR_DEPARTMENT_OTHER)
Admission: RE | Admit: 2020-10-10 | Discharge: 2020-10-10 | Disposition: A | Discharge status: Home / Self Care | Payer: Medicare PPO | Source: Ambulatory Visit | Attending: Cardiology | Admitting: Cardiology

## 2020-10-10 ENCOUNTER — Ambulatory Visit (HOSPITAL_COMMUNITY)
Admission: RE | Admit: 2020-10-10 | Discharge: 2020-10-10 | Disposition: A | Discharge status: Home / Self Care | Payer: Medicare PPO | Source: Ambulatory Visit | Attending: Cardiology | Admitting: Cardiology

## 2020-10-10 ENCOUNTER — Encounter (HOSPITAL_COMMUNITY): Payer: Self-pay | Admitting: Cardiology

## 2020-10-10 VITALS — BP 90/50 | HR 54 | Wt 166.8 lb

## 2020-10-10 DIAGNOSIS — Z794 Long term (current) use of insulin: Secondary | ICD-10-CM | POA: Diagnosis not present

## 2020-10-10 DIAGNOSIS — N183 Chronic kidney disease, stage 3 unspecified: Secondary | ICD-10-CM | POA: Diagnosis not present

## 2020-10-10 DIAGNOSIS — Z7982 Long term (current) use of aspirin: Secondary | ICD-10-CM | POA: Diagnosis not present

## 2020-10-10 DIAGNOSIS — E1122 Type 2 diabetes mellitus with diabetic chronic kidney disease: Secondary | ICD-10-CM | POA: Diagnosis not present

## 2020-10-10 DIAGNOSIS — Z923 Personal history of irradiation: Secondary | ICD-10-CM | POA: Diagnosis not present

## 2020-10-10 DIAGNOSIS — I2510 Atherosclerotic heart disease of native coronary artery without angina pectoris: Secondary | ICD-10-CM | POA: Insufficient documentation

## 2020-10-10 DIAGNOSIS — Z9221 Personal history of antineoplastic chemotherapy: Secondary | ICD-10-CM | POA: Insufficient documentation

## 2020-10-10 DIAGNOSIS — Z0189 Encounter for other specified special examinations: Secondary | ICD-10-CM

## 2020-10-10 DIAGNOSIS — Z886 Allergy status to analgesic agent status: Secondary | ICD-10-CM | POA: Insufficient documentation

## 2020-10-10 DIAGNOSIS — Z8249 Family history of ischemic heart disease and other diseases of the circulatory system: Secondary | ICD-10-CM | POA: Insufficient documentation

## 2020-10-10 DIAGNOSIS — Z7952 Long term (current) use of systemic steroids: Secondary | ICD-10-CM | POA: Diagnosis not present

## 2020-10-10 DIAGNOSIS — I130 Hypertensive heart and chronic kidney disease with heart failure and stage 1 through stage 4 chronic kidney disease, or unspecified chronic kidney disease: Secondary | ICD-10-CM | POA: Diagnosis not present

## 2020-10-10 DIAGNOSIS — E785 Hyperlipidemia, unspecified: Secondary | ICD-10-CM

## 2020-10-10 DIAGNOSIS — Z9011 Acquired absence of right breast and nipple: Secondary | ICD-10-CM | POA: Diagnosis not present

## 2020-10-10 DIAGNOSIS — I10 Essential (primary) hypertension: Secondary | ICD-10-CM

## 2020-10-10 DIAGNOSIS — C50911 Malignant neoplasm of unspecified site of right female breast: Secondary | ICD-10-CM

## 2020-10-10 DIAGNOSIS — Z803 Family history of malignant neoplasm of breast: Secondary | ICD-10-CM | POA: Diagnosis not present

## 2020-10-10 DIAGNOSIS — I509 Heart failure, unspecified: Secondary | ICD-10-CM | POA: Insufficient documentation

## 2020-10-10 DIAGNOSIS — Z79899 Other long term (current) drug therapy: Secondary | ICD-10-CM | POA: Diagnosis not present

## 2020-10-10 DIAGNOSIS — G4733 Obstructive sleep apnea (adult) (pediatric): Secondary | ICD-10-CM | POA: Diagnosis not present

## 2020-10-10 DIAGNOSIS — Z833 Family history of diabetes mellitus: Secondary | ICD-10-CM | POA: Insufficient documentation

## 2020-10-10 LAB — ECHOCARDIOGRAM COMPLETE
Area-P 1/2: 2.71 cm2
P 1/2 time: 1030 msec
S' Lateral: 2.9 cm

## 2020-10-10 LAB — LIPID PANEL
Cholesterol: 94 mg/dL (ref 0–200)
HDL: 55 mg/dL (ref >40)
LDL Cholesterol: 26 mg/dL (ref 0–99)
Total CHOL/HDL Ratio: 1.7 RATIO
Triglycerides: 63 mg/dL (ref <150)
VLDL: 13 mg/dL (ref 0–40)

## 2020-10-10 MED ORDER — LOSARTAN POTASSIUM 50 MG PO TABS: 50.0000 mg | ORAL_TABLET | Freq: Every day | ORAL | 3 refills | Status: DC

## 2020-10-10 NOTE — Patient Instructions (Signed)
Labs done today. We will contact you only if your labs are abnormal.  DECREASE Losartan to 50mg  (1 tablet) by mouth daily.   No other medication changes were made. Please continue all current medications as prescribed.  Your physician recommends that you schedule a follow-up appointment in: 3 months with an echo prior to your exam.  Your physician has requested that you have an echocardiogram. Echocardiography is a painless test that uses sound waves to create images of your heart. It provides your doctor with information about the size and shape of your heart and how well your heart's chambers and valves are working. This procedure takes approximately one hour. There are no restrictions for this procedure.  If you have any questions or concerns before your next appointment please send [name] a message through [place] or call our office at [phone].    TO LEAVE A MESSAGE FOR THE NURSE SELECT OPTION 2, PLEASE LEAVE A MESSAGE INCLUDING: YOUR NAME DATE OF BIRTH CALL BACK NUMBER REASON FOR CALL**this is important as we prioritize the call backs  YOU WILL RECEIVE A CALL BACK THE SAME DAY AS LONG AS YOU CALL BEFORE 4:00 PM   Do the following things EVERYDAY: Weigh yourself in the morning before breakfast. Write it down and keep it in a log. Take your medicines as prescribed Eat low salt foods--Limit salt (sodium) to 2000 mg per day.  Stay as active as you can everyday Limit all fluids for the day to less than 2 liters   At the [facility], you and your health needs are our priority. As part of our continuing mission to provide you with exceptional heart care, we have created designated Provider Care Teams. These Care Teams include your primary Cardiologist (physician) and Advanced Practice Providers (APPs- Physician Assistants and Nurse Practitioners) who all work together to provide you with the care you need, when you need it.   You may see any of the following providers  on your designated Care Team at your next follow up: Dr Glori Bickers Dr Haynes Kerns, NP Lyda Jester, [place] Audry Riles, PharmD   Please be sure to bring in all your medications bottles to every appointment.

## 2020-10-10 NOTE — Progress Notes (Signed)
Patient ID: Darlene [name], female   DOB: June 27, 1950, 70 y.o.   MRN: [id number] Oncologist: Dr Jana Hakim   HPI: Darlene [name] is a 70 y.o.with history of R breast cancer, right axillary lymph node biopsy 07/19/2014 for a cT2  pN1, stage IIB  invasive ductal carcinoma, grade 2, estrogen and progesterone receptor negative, with an MIB-1 of 90%, and HER-2 positive , OSA -> CPAP, bariatric surgery, and DM, referred to cardio-oncology clinic by Dr Jana Hakim  Completed neoadjuvant chemotherapy with carboplatin, docetaxel, trastuzumab and pertuzumab given every 3 weeks 6. Trastuzumab + capecitabine were completed in 9/17.  She had lumpectomy 12/16 and had mastectomy in 1/17.  She has completed radiation.   She was found in 11/17 to have a recurrence by PET => right retropectoral lymph node enlarged. In 2/18, she started on treatment with ado-trastuzumab emtansine, now back on Herceptin.  Echo prior to treatment showed stable EF.  Starting in 6/19, she was transitioned to [facility].   She is now off Kadcyla and back on Herceptin/Perjeta for use long-term.     Echo in 2/21 showed EF 55-60% with more negative strain.   Coronary artery calcium score was done in 2/21, this showed CAC 3653 Agatston units, placing her in the 99th percentile. She was started on a Crestor but LFTs rose.  She had RUQ [name] without significant abnormality.  Hepatitis labs were negative.  The statin was stopped.  Cardiolite in 3/21 showed EF 51%, no ischemia/infarction.  She ended up on bempedoic acid.   Echo in 6/21 showed EF 55-60% with GLS -17.6%.  Echo today showed EF 55-60%, GLS -15.8%, normal RV.  Echo in 2/22 showed EF 55-60% with GLS -27.5%.  Echo was done today and reviewed, EF 55% but strain analysis was not done.   She was found to have liver lesion concerning for metastatic disease.  She has been started on Fam-trastuzumab deruxtecan (Enhertu).   She comes in today for followup of CAD and chemotherapy with trastuzumab.   No exertional chest pain though she does have heartburn-type symptoms at times while lying in bed.  No exertional dyspnea. She has generalized fatigue. Weight is down 13 lbs. BP has been low and she has had lightheadedness with standing.  Creatinine has been elevated and she has been followed by nephrology.   Labs (1/21): LDL 75 Labs (4/21): LDL 42, AST 70 => 125, ALT 117 => 255 Labs (10/21): LDL 18, HDL 58,  K 3.8, creatinine 1.88 Labs (1/22): K 4.1, creatinine 1.85 Labs (9/22): K 4.4, creatinine 2.38  Echo (7/16) with EF 60-65%, grade II diastolic dysfunction.  Echo (10/16) with EF 66%, GLS -17%. Echo (1/17) with EF 60-65%, grade II diastolic dysfunction, lateral s' 10, GLS -18.8% Echo (4/17) with EF 60-65%, grade II diastolic dysfunction, lateral s' 9.5, strain poorly traced and off axis, normal RV size and systolic function.  Echo (6/17) with EF 55-60%, aortic sclerosis without stenosis, difficult images for strain, probably not accurate.  Echo (10/17) with EF 55-60%, mild LVH, moderate diastolic dysfunction, normal RV size and systolic function.  Echo (2/18) with EF 55-60%, GLS -15% but technically difficult study Echo (6/18) with EF 60-65%, GLS -15% but technically difficult study.  Echo (9/18) with EF 55-60%, GLS -19.8%, grade II diastolic dysfunction.  Echo (1/19) with EF 55-60%, normal RV size and systolic function, GLS -40.9% (done with different machine).  Echo (4/19) with EF 55-60%, mild LVH, GLS -17% Echo (9/19) with EF 55-60%, GLS -17.1%, normal RV size and  systolic function.  Echo (8/20): EF 50-55%.  Echo (11/20): EF 50-55%, GLS -12.7% (not ideal tracing), mildly decreased RV systolic function.   Echo (2/21): EF 55-60%, GLS -17.9%, grade II diastolic dysfunction, normal RV Echo (6/21): EF 55-60% with GLS -17.6% Echo (10/21): EF 55-60%, normal RV, GLS -15.8%.  Echo (2/22): EF 55-60% with GLS -27.5%. Echo (9/22): EF 55%, strain analysis not done.   Review of Systems: All  systems reviewed and negative except as per HPI.   Past Medical History:  Diagnosis Date   Anemia    hx of - during 1st round chemo   Anxiety    Arthritis    in fingers, shoulders   Back pain    Balance problem    Breast cancer ([place])    Breast cancer of upper-outer quadrant of right female breast ([place]) 07/22/2014   Bunion, right foot    CHF (congestive heart failure) (H[facility])    Chronic kidney disease    creatininei levels high per pt    Clumsiness    Constipation    Depression    Diabetes mellitus without complication ([place])    06+ years type 2    Diabetic retinopathy ([place])    torn retina   Eating disorder    binge eating   Epistaxis 08/26/2014   Fatty liver    Foot drop, left foot    HCAP (healthcare-associated pneumonia) 12/18/2014   Heart murmur    never had any problems   History of radiation therapy 04/05/15-05/19/15   right chest wall was treated to 50.4 Gy in 28 fractions, right mastectomy scar was treated to 10 Gy in 5 fractions   Hyperlipidemia    Hypertension    Joint pain    Multiple food allergies    Neuromuscular disorder ([place])    diabetic neuropathy   Obesity    Obstructive sleep apnea on CPAP    does not use cpap all the time has lost 160 lbs    Ovarian cyst rupture    possible   Pneumonia    Pneumonia 11/2014   Radiation 04/05/15-05/19/15   right chest wall 50.4 Gy, mastectomy scar 10 Gy   Sleep apnea    Thyroid nodule 06/2014    Current Outpatient Medications  Medication Sig Dispense Refill   Accu-Chek Softclix Lancets lancets 1 each by Other route 4 (four) times daily. Test blood sugars 4 x times daily 200 each 1   aspirin EC 81 MG tablet Take 1 tablet (81 mg total) by mouth daily. 90 tablet 3   Blood Glucose Calibration (ACCU-CHEK GUIDE CONTROL) LIQD 1 Bottle by In Vitro route as needed. 1 each 0   Blood Glucose Monitoring Suppl (ACCU-CHEK GUIDE) w/Device KIT 1 each by Does not apply route 4 (four) times daily. Use to test blood sugars 4x a day 1 kit 0    buPROPion (WELLBUTRIN SR) 200 MG 12 hr tablet Take one tablet by mouth once daily. 90 tablet 0   CALCIUM CITRATE-VITAMIN D PO Take 1 tablet by mouth 3 (three) times daily.      carvedilol (COREG) 6.25 MG tablet Take 1 tablet (6.25 mg total) by mouth 2 (two) times daily. 180 tablet 3   cholecalciferol (VITAMIN D) 1000 units tablet Take 1,000 Units by mouth daily.     cholestyramine (QUESTRAN) 4 g packet Take 1 packet (4 g total) by mouth 2 (two) times daily as needed. 10 each 12   Cyanocobalamin (VITAMIN B-12 PO) Take 1 tablet by mouth every  14 (fourteen) days.     dapagliflozin propanediol (FARXIGA) 10 MG TABS tablet Take 1 tablet (10 mg total) by mouth daily before breakfast. 30 tablet 11   dexamethasone (DECADRON) 4 MG tablet Take 2 tablets (8 mg total) by mouth daily. Start the day after chemotherapy for 2 days. 30 tablet 1   diphenhydrAMINE (BENADRYL) 25 MG tablet Take 25 mg by mouth daily as needed for allergies or sleep.      glucose blood (ACCU-CHEK GUIDE) test strip Use Accu Chek test strips to check blood sugar 4 times daily. DX:E11.21 400 each 0   insulin glargine (LANTUS SOLOSTAR) 100 UNIT/ML Solostar Pen Inject 8 Units into the skin daily. 7 mL 3   lidocaine-prilocaine (EMLA) cream Apply to affected area once 30 g 3   liraglutide (VICTOZA) 18 MG/3ML SOPN Pt to take 1.8 mg qam.  Pt to increase dose to 1.2 for now to help with appetite. 27 mL 0   loratadine (CLARITIN) 10 MG tablet Take 10 mg by mouth as needed for allergies.     NEXLIZET 180-10 MG TABS TAKE 1 TABLET BY MOUTH DAILY. 30 tablet 11   nystatin (MYCOSTATIN/NYSTOP) powder Apply 1 application topically as needed.     ondansetron (ZOFRAN) 4 MG tablet Take 1 tablet (4 mg total) by mouth every 8 (eight) hours as needed for nausea or vomiting. 20 tablet 0   Pediatric Multivitamins-Iron (FLINTSTONES PLUS IRON) chewable tablet Chew 1 tablet by mouth 2 (two) times daily.     Probiotic Product ([employer] PROBIOTIC PO) Take 1 capsule by  mouth daily.      prochlorperazine (COMPAZINE) 10 MG tablet Take 1 tablet (10 mg total) by mouth every 6 (six) hours as needed (Nausea or vomiting). 30 tablet 1   Propylene Glycol (SYSTANE BALANCE OP) Place 1-2 drops into both eyes 3 (three) times daily as needed (for dry eyes).      senna-docusate (SENOKOT-S) 8.6-50 MG tablet Take 1 tablet by mouth daily as needed for moderate constipation.      SURE COMFORT PEN NEEDLES 32G X 4 MM MISC Use as instructed to inject insulin and Victoza daily. 200 each 0   vortioxetine HBr (TRINTELLIX) 20 MG TABS tablet Take 1 tablet (20 mg total) by mouth at bedtime. 90 tablet 0   losartan (COZAAR) 50 MG tablet Take 1 tablet (50 mg total) by mouth daily. 90 tablet 3   No current facility-administered medications for this encounter.    Allergies  Allergen Reactions   Nsaids Other (See Comments)    H/o gastric bypass - avoid NSAIDs   Tape Hives, Rash and Other (See Comments)    Adhesives in bandaids      Social History   Socioeconomic History   Marital status: Married    Spouse name: [name]   Number of children: 0   Years of education: Water quality scientist   Highest education level: Not on file  Occupational History   Occupation: Pensions consultant: [employer]  Tobacco Use   Smoking status: Never   Smokeless tobacco: Never  Vaping Use   Vaping Use: Never used  Substance and Sexual Activity   Alcohol use: Yes    Alcohol/week: 1.0 - 2.0 standard drink    Types: 1 - 2 Standard drinks or equivalent per week    Comment: social   Drug use: No   Sexual activity: Yes    Partners: Male    Birth control/protection: Post-menopausal, Other-see comments    Comment:  husband vasectomy  Other Topics Concern   Not on file  Social History Narrative   Patient is married [name]) and lives at home with her husband.   Patient does not have any children.   Patient is working for a [employer].   Patient has a Bachelor's degree.   Patient  is right-handed.   Patient does not drink any caffeine.   Social Determinants of Health   Financial Resource Strain: Not on file  Food Insecurity: Not on file  Transportation Needs: Not on file  Physical Activity: Not on file  Stress: Not on file  Social Connections: Not on file  Intimate Partner Violence: Not on file      Family History  Problem Relation Age of Onset   CVA Mother    Heart disease Mother    Sudden death Mother    Diabetes Father    Heart failure Father    Hypertension Father    Kidney disease Father    Obesity Father    Hypertension Sister    Diabetes Brother    Breast cancer Paternal Grandmother 71   Diabetes Paternal Uncle    Ovarian cancer Neg Hx     Vitals:   10/10/20 1056  BP: (!) 90/50  Pulse: (!) 54  SpO2: 98%  Weight: 75.7 kg (166 lb 12.8 oz)    PHYSICAL EXAM: General: NAD Neck: No JVD, no thyromegaly or thyroid nodule.  Lungs: Clear to auscultation bilaterally with normal respiratory effort. CV: Nondisplaced PMI.  Heart regular S1/S2, no S3/S4, no murmur.  No peripheral edema.  No carotid bruit.  Normal pedal pulses.  Abdomen: Soft, nontender, no hepatosplenomegaly, no distention.  Skin: Intact without lesions or rashes.  Neurologic: Alert and oriented x 3.  Psych: Normal affect. Extremities: No clubbing or cyanosis.  HEENT: Normal.   ASSESSMENT & PLAN:  1. Breast cancer: Metastatic.  Currently on Fam-trastuzumab deruxtecan (Enhertu).  Echo today shows EF 55% but strain imaging was not done - I will send her back to the echo lab for strain imaging. - With soft BP/orthostasis, decrease losartan to 50 mg daily.  - Continue Coreg 6.25 mg bid.  - Repeat echo + followup in 3 months.  2. HTN: BP low, decreasing losartan as above.   3. OSA: Continue nightly CPAP   4. CAD: Coronary artery calcium score was elevated, 3653 Agatston units, placing her in the 99th percentile for her age and gender.  This suggests high risk for future cardiac  events.  She has no chest pain.  Cardiolite 3/21 with no ischemia/infarction.  Rise in LFTs with Crestor. Unable to obtain Repatha for her.  - She is now on bempedoic acid, check lipids today.  - Continue ASA 81, ARB, and beta blocker.  5. CKD stage 3: Suspect diabetic nephropathy.   - Continue dapagliflozin.  - Consider addition of Kerendia (finerenone) in the future.  - Losartan dose decreased with soft BP.   Followup in 3 months with echo  Loralie Champagne 10/10/2020

## 2020-10-10 NOTE — Progress Notes (Signed)
  Echocardiogram 2D Echocardiogram has been performed.  Fidel Levy 10/10/2020, 10:57 AM

## 2020-10-11 DIAGNOSIS — H3582 Retinal ischemia: Secondary | ICD-10-CM | POA: Diagnosis not present

## 2020-10-11 DIAGNOSIS — E113513 Type 2 diabetes mellitus with proliferative diabetic retinopathy with macular edema, bilateral: Secondary | ICD-10-CM | POA: Diagnosis not present

## 2020-10-24 ENCOUNTER — Encounter: Payer: Self-pay | Admitting: Podiatry

## 2020-10-25 ENCOUNTER — Telehealth: Payer: Self-pay | Admitting: Oncology

## 2020-10-25 ENCOUNTER — Other Ambulatory Visit: Payer: Self-pay | Admitting: *Deleted

## 2020-10-25 DIAGNOSIS — C50411 Malignant neoplasm of upper-outer quadrant of right female breast: Secondary | ICD-10-CM

## 2020-10-25 MED FILL — Dexamethasone Sodium Phosphate Inj 100 MG/10ML: INTRAMUSCULAR | Qty: 1 | Status: AC

## 2020-10-25 NOTE — Telephone Encounter (Signed)
Sch per 9/16 los , pt aware 

## 2020-10-26 ENCOUNTER — Other Ambulatory Visit: Payer: Self-pay

## 2020-10-26 ENCOUNTER — Inpatient Hospital Stay: Payer: Medicare PPO

## 2020-10-26 ENCOUNTER — Inpatient Hospital Stay: Payer: Medicare PPO | Admitting: Pharmacist

## 2020-10-26 ENCOUNTER — Inpatient Hospital Stay: Payer: Medicare PPO | Attending: Oncology

## 2020-10-26 VITALS — BP 99/41 | HR 65 | Temp 98.1°F | Resp 18 | Ht 69.0 in | Wt 168.2 lb

## 2020-10-26 VITALS — BP 116/62 | HR 68 | Resp 16

## 2020-10-26 DIAGNOSIS — Z95828 Presence of other vascular implants and grafts: Secondary | ICD-10-CM

## 2020-10-26 DIAGNOSIS — C50411 Malignant neoplasm of upper-outer quadrant of right female breast: Secondary | ICD-10-CM

## 2020-10-26 DIAGNOSIS — Z171 Estrogen receptor negative status [ER-]: Secondary | ICD-10-CM | POA: Diagnosis not present

## 2020-10-26 DIAGNOSIS — C50811 Malignant neoplasm of overlapping sites of right female breast: Secondary | ICD-10-CM | POA: Insufficient documentation

## 2020-10-26 DIAGNOSIS — Z5112 Encounter for antineoplastic immunotherapy: Secondary | ICD-10-CM | POA: Insufficient documentation

## 2020-10-26 DIAGNOSIS — Z7189 Other specified counseling: Secondary | ICD-10-CM

## 2020-10-26 DIAGNOSIS — C787 Secondary malignant neoplasm of liver and intrahepatic bile duct: Secondary | ICD-10-CM | POA: Insufficient documentation

## 2020-10-26 DIAGNOSIS — D649 Anemia, unspecified: Secondary | ICD-10-CM | POA: Diagnosis not present

## 2020-10-26 LAB — CBC WITH DIFFERENTIAL (CANCER CENTER ONLY)
Abs Immature Granulocytes: 0.04 10*3/uL (ref 0.00–0.07)
Basophils Absolute: 0 10*3/uL (ref 0.0–0.1)
Basophils Relative: 1 %
Eosinophils Absolute: 0.1 10*3/uL (ref 0.0–0.5)
Eosinophils Relative: 3 %
HCT: 26.5 % — ABNORMAL LOW (ref 36.0–46.0)
Hemoglobin: 9.1 g/dL — ABNORMAL LOW (ref 12.0–15.0)
Immature Granulocytes: 1 %
Lymphocytes Relative: 25 %
Lymphs Abs: 0.9 10*3/uL (ref 0.7–4.0)
MCH: 32.4 pg (ref 26.0–34.0)
MCHC: 34.3 g/dL (ref 30.0–36.0)
MCV: 94.3 fL (ref 80.0–100.0)
Monocytes Absolute: 0.5 10*3/uL (ref 0.1–1.0)
Monocytes Relative: 13 %
Neutro Abs: 2.2 10*3/uL (ref 1.7–7.7)
Neutrophils Relative %: 57 %
Platelet Count: 248 10*3/uL (ref 150–400)
RBC: 2.81 MIL/uL — ABNORMAL LOW (ref 3.87–5.11)
RDW: 13.2 % (ref 11.5–15.5)
WBC Count: 3.8 10*3/uL — ABNORMAL LOW (ref 4.0–10.5)
nRBC: 0 % (ref 0.0–0.2)

## 2020-10-26 LAB — CMP (CANCER CENTER ONLY)
ALT: 9 U/L (ref 0–44)
AST: 17 U/L (ref 15–41)
Albumin: 3.3 g/dL — ABNORMAL LOW (ref 3.5–5.0)
Alkaline Phosphatase: 47 U/L (ref 38–126)
Anion gap: 6 (ref 5–15)
BUN: 51 mg/dL — ABNORMAL HIGH (ref 8–23)
CO2: 23 mmol/L (ref 22–32)
Calcium: 8.7 mg/dL — ABNORMAL LOW (ref 8.9–10.3)
Chloride: 110 mmol/L (ref 98–111)
Creatinine: 2.32 mg/dL — ABNORMAL HIGH (ref 0.44–1.00)
GFR, Estimated: 22 mL/min — ABNORMAL LOW (ref >60)
Glucose, Bld: 107 mg/dL — ABNORMAL HIGH (ref 70–99)
Potassium: 4.6 mmol/L (ref 3.5–5.1)
Sodium: 139 mmol/L (ref 135–145)
Total Bilirubin: 0.4 mg/dL (ref 0.3–1.2)
Total Protein: 5.9 g/dL — ABNORMAL LOW (ref 6.5–8.1)

## 2020-10-26 MED ORDER — DEXTROSE 5 % IV SOLN: Freq: Once | INTRAVENOUS | Status: AC

## 2020-10-26 MED ORDER — SODIUM CHLORIDE 0.9% FLUSH
10.0000 mL | Freq: Once | INTRAVENOUS | Status: AC
  Administered 2020-10-26: 10 mL

## 2020-10-26 MED ORDER — DIPHENHYDRAMINE HCL 25 MG PO CAPS
25.0000 mg | ORAL_CAPSULE | Freq: Once | ORAL | Status: AC
  Administered 2020-10-26: 25 mg via ORAL
  Filled 2020-10-26: qty 1

## 2020-10-26 MED ORDER — SODIUM CHLORIDE 0.9% FLUSH
10.0000 mL | INTRAVENOUS | Status: DC | PRN
  Administered 2020-10-26: 10 mL

## 2020-10-26 MED ORDER — FAM-TRASTUZUMAB DERUXTECAN-NXKI CHEMO 100 MG IV SOLR
5.2500 mg/kg | Freq: Once | INTRAVENOUS | Status: AC
  Administered 2020-10-26: 400 mg via INTRAVENOUS
  Filled 2020-10-26: qty 20

## 2020-10-26 MED ORDER — HEPARIN SOD (PORK) LOCK FLUSH 100 UNIT/ML IV SOLN
500.0000 [IU] | Freq: Once | INTRAVENOUS | Status: AC | PRN
  Administered 2020-10-26: 500 [IU]

## 2020-10-26 MED ORDER — SODIUM CHLORIDE 0.9 % IV SOLN
10.0000 mg | Freq: Once | INTRAVENOUS | Status: AC
  Administered 2020-10-26: 10 mg via INTRAVENOUS
  Filled 2020-10-26: qty 10

## 2020-10-26 MED ORDER — ACETAMINOPHEN 325 MG PO TABS
650.0000 mg | ORAL_TABLET | Freq: Once | ORAL | Status: AC
  Administered 2020-10-26: 650 mg via ORAL
  Filled 2020-10-26: qty 2

## 2020-10-26 MED ORDER — PALONOSETRON HCL INJECTION 0.25 MG/5ML
0.2500 mg | Freq: Once | INTRAVENOUS | Status: AC
  Administered 2020-10-26: 0.25 mg via INTRAVENOUS
  Filled 2020-10-26: qty 5

## 2020-10-26 NOTE — Patient Instructions (Signed)
 -   Hold Losartan  - Continue carvedilol  - Follow up with your cardiologist as soon as possible for blood pressure optimization

## 2020-10-26 NOTE — Progress Notes (Signed)
OK to trt with elevated Sr Cr of 2.32 per Wilber Bihari, NP

## 2020-10-26 NOTE — Patient Instructions (Signed)
[facility]  Discharge Instructions: ?Thank you for choosing [facility] to provide your oncology and hematology care.  ? ?If you have a lab appointment with the Cancer Center, please go directly to the Cancer Center and check in at the registration area. ?  ?Wear comfortable clothing and clothing appropriate for easy access to any Portacath or PICC line.  ? ?We strive to give you quality time with your provider. You may need to reschedule your appointment if you arrive late (15 or more minutes).  Arriving late affects you and other patients whose appointments are after yours.  Also, if you miss three or more appointments without notifying the office, you may be dismissed from the clinic at the provider?s discretion.    ?  ?For prescription refill requests, have your pharmacy contact our office and allow 72 hours for refills to be completed.   ? ?Today you received the following chemotherapy and/or immunotherapy agents: Enhertu.    ?  ?To help prevent nausea and vomiting after your treatment, we encourage you to take your nausea medication as directed. ? ?BELOW ARE SYMPTOMS THAT SHOULD BE REPORTED IMMEDIATELY: ?*FEVER GREATER THAN 100.4 F (38 ?C) OR HIGHER ?*CHILLS OR SWEATING ?*NAUSEA AND VOMITING THAT IS NOT CONTROLLED WITH YOUR NAUSEA MEDICATION ?*UNUSUAL SHORTNESS OF BREATH ?*UNUSUAL BRUISING OR BLEEDING ?*URINARY PROBLEMS (pain or burning when urinating, or frequent urination) ?*BOWEL PROBLEMS (unusual diarrhea, constipation, pain near the anus) ?TENDERNESS IN MOUTH AND THROAT WITH OR WITHOUT PRESENCE OF ULCERS (sore throat, sores in mouth, or a toothache) ?UNUSUAL RASH, SWELLING OR PAIN  ?UNUSUAL VAGINAL DISCHARGE OR ITCHING  ? ?Items with * indicate a potential emergency and should be followed up as soon as possible or go to the Emergency Department if any problems should occur. ? ?Please show the CHEMOTHERAPY ALERT CARD or IMMUNOTHERAPY ALERT CARD at check-in to  the Emergency Department and triage nurse. ? ?Should you have questions after your visit or need to cancel or reschedule your appointment, please contact [facility]  Dept: [phone]  and follow the prompts.  Office hours are 8:00 a.m. to 4:30 p.m. Monday - Friday. Please note that voicemails left after 4:00 p.m. may not be returned until the following business day.  We are closed weekends and major holidays. You have access to a nurse at all times for urgent questions. Please call the main number to the clinic Dept: [phone] and follow the prompts. ? ? ?For any non-urgent questions, you may also contact your provider using MyChart. We now offer e-Visits for anyone 18 and older to request care online for non-urgent symptoms. For details visit [email or web address]. ?  ?Also download the MyChart app! Go to the app store, search "MyChart", open the app, select [place], and log in with your MyChart username and password. ? ?Due to Covid, a mask is required upon entering the hospital/clinic. If you do not have a mask, one will be given to you upon arrival. For doctor visits, patients may have 1 support person aged 18 or older with them. For treatment visits, patients cannot have anyone with them due to current Covid guidelines and our immunocompromised population.  ? ?

## 2020-10-26 NOTE — Progress Notes (Signed)
[place]       Telephone: [phone]?Fax: [phone]   Oncology Clinical Pharmacist Practitioner Initial Assessment  Darlene [name] is a 70 y.o. female with a diagnosis of metastatic breast cancer.  Indication/Regimen Fam-trastuzumab deruxtecan (Enhertu) is being used appropriately for treatment of metastatic breast cancer by Dr. Lurline Del.    Wt Readings from Last 1 Encounters:  10/26/20 168 lb 3.2 oz (76.3 kg)    Estimated body surface area is 1.93 meters squared as calculated from the following:   Height as of this encounter: _0  (1.753 m).   Weight as of this encounter: 168 lb 3.2 oz (76.3 kg).  The dosing regimen is 410 mg (5.4 mg/kg) by intravenous route day 1 every 21 days. It is planned to continue until disease progression or unacceptable toxicity. Darlene [name] started fam-trastuzumab deruxtecan 3 weeks ago and is here for cycle 2.  She is doing well.  Her BP today prior to our visit was 99/41.  On 10/10/20 it was 90/50.  She does report some dizziness which improves if she does not make any sudden movements such as sit to stand.  We discussed to slowly rise in the morning and allow her blood pressure to normalize before standing. She does see a cardiologist that manages her blood pressure.  Currently on losartan and carvedilol.  Recommended that she follow up with her cardiologist tomorrow for blood pressure optimization but in the meantime, to hold the losartan and continue the carvedilol after discussing case with Dr. Lindi Adie (Dr. Jana Hakim is off today). Patient verbalized understanding and written instructions were provided.  She reports 3 episodes of emesis which started about 4 days after for 1 episode, then 2 episodes this past week that were several days apart.  She states she did take her dexamethasone for 2 days as prescribed and also used her ondansetron.  She does not believe she used her prochlorperazine. We reviewed to take the  anti-nausea medications at the first signs of nausea and to not wait until she throws up. She continues to drink plenty of fluids and monitor her urine output.  She does have CKD, SCR is stable and she follows with a nephrologist, no electrolyte wasting evident in labs today. She has had some diarrhea and has loperamide at home.  She feels the diarrhea is manageable currently. She also has diabetes which her PCP follows.  Her blood glucose today was 107 mg/dL. She is thinking about re-establishing care with her endocrinologist.  Her weight is slightly increased today at 168 lbs. Darlene [name] reports some indigestion and has been taking Tums OTC.  She was on calcium citrate and will start back on this regimen. She may discuss adding famotidine or PPI with her PCP if indigestion continues. Reviewed that she will next see Dr. Jana Hakim in 3 weeks with labs/treatment, and then clinical pharmacy in 6 weeks with labs and treatment.  Dose Modifications None at this time   Allergies Allergies  Allergen Reactions   Nsaids Other (See Comments)    H/o gastric bypass - avoid NSAIDs   Tape Hives, Rash and Other (See Comments)    Adhesives in bandaids    Laboratory Data CBC EXTENDED Latest Ref Rng & Units 10/26/2020 10/05/2020 09/27/2020  WBC 4.0 - 10.5 K/uL 3.8(L) 6.9 5.2  RBC 3.87 - 5.11 MIL/uL 2.81(L) 3.12(L) 3.32(L)  HGB 12.0 - 15.0 g/dL 9.1(L) 10.2(L) 10.8(L)  HCT 36.0 - 46.0 % 26.5(L) 30.2(L) 31.9(L)  PLT 150 -  400 K/uL 248 186 191  NEUTROABS 1.7 - 7.7 K/uL 2.2 5.1 3.5  LYMPHSABS 0.7 - 4.0 K/uL 0.9 1.2 1.2    CMP Latest Ref Rng & Units 10/26/2020 10/05/2020 09/27/2020  Glucose 70 - 99 mg/dL 107(H) 143(H) 162(H)  BUN 8 - 23 mg/dL 51(H) 45(H) 57(H)  Creatinine 0.44 - 1.00 mg/dL 2.32(H) 2.38(H) 2.56(H)  Sodium 135 - 145 mmol/L 139 141 139  Potassium 3.5 - 5.1 mmol/L 4.6 4.4 4.0  Chloride 98 - 111 mmol/L 110 112(H) 108  CO2 22 - 32 mmol/L _0 Calcium 8.9 - 10.3 mg/dL 8.7(L) 8.8(L) 8.9  Total  Protein 6.5 - 8.1 g/dL 5.9(L) 6.1(L) 6.2(L)  Total Bilirubin 0.3 - 1.2 mg/dL 0.4 0.4 0.5  Alkaline Phos 38 - 126 U/L 47 48 53  AST 15 - 41 U/L _1 ALT 0 - 44 U/L _2 Contraindications Contraindications were reviewed? Yes Contraindications to therapy were identified? No   Safety Precautions The following safety precautions for the use of fam-trastuzumab deruxtecan were reviewed:  Neutropenia: reviewed the importance of having a thermometer and the Centers for Disease Control and Prevention (CDC) definition of fever which is 100.86F (38C) or higher. Patient should call 24/7 triage at [phone] if experiencing a fever or any other symptoms Pneumonitis: reviewed possible signs and symptoms which may include shortness of breath, cough, fatigue, or painful inspirations.  Cardiotoxicity: discussed possible risk of decreased LVEF. She had an ECHO on 10/10/20 which was stable from 3 months ago and will be scheduled through her cardiologist sometime in December. Anemia Thrombocytopenia Nausea/Vomiting LFT elevation: WNL today Fatigue Alopecia Electrolyte abnormalities Diarrhea Constipation Pain Decreased appetite  Medication Reconciliation Current Outpatient Medications  Medication Sig Dispense Refill   Accu-Chek Softclix Lancets lancets 1 each by Other route 4 (four) times daily. Test blood sugars 4 x times daily 200 each 1   aspirin EC 81 MG tablet Take 1 tablet (81 mg total) by mouth daily. 90 tablet 3   Blood Glucose Calibration (ACCU-CHEK GUIDE CONTROL) LIQD 1 Bottle by In Vitro route as needed. 1 each 0   Blood Glucose Monitoring Suppl (ACCU-CHEK GUIDE) w/Device KIT 1 each by Does not apply route 4 (four) times daily. Use to test blood sugars 4x a day 1 kit 0   buPROPion (WELLBUTRIN SR) 200 MG 12 hr tablet Take one tablet by mouth once daily. 90 tablet 0   CALCIUM CITRATE-VITAMIN D PO Take 1 tablet by mouth 3 (three) times daily.      carvedilol (COREG) 6.25 MG  tablet Take 1 tablet (6.25 mg total) by mouth 2 (two) times daily. 180 tablet 3   cholecalciferol (VITAMIN D) 1000 units tablet Take 1,000 Units by mouth daily.     cholestyramine (QUESTRAN) 4 g packet Take 1 packet (4 g total) by mouth 2 (two) times daily as needed. 10 each 12   Cyanocobalamin (VITAMIN B-12 PO) Take 1 tablet by mouth every 14 (fourteen) days.     dapagliflozin propanediol (FARXIGA) 10 MG TABS tablet Take 1 tablet (10 mg total) by mouth daily before breakfast. 30 tablet 11   dexamethasone (DECADRON) 4 MG tablet Take 2 tablets (8 mg total) by mouth daily. Start the day after chemotherapy for 2 days. 30 tablet 1   diphenhydrAMINE (BENADRYL) 25 MG tablet Take 25 mg by mouth daily as needed for allergies or sleep.      glucose blood (ACCU-CHEK GUIDE) test strip Use Accu  Chek test strips to check blood sugar 4 times daily. DX:E11.21 400 each 0   insulin glargine (LANTUS SOLOSTAR) 100 UNIT/ML Solostar Pen Inject 8 Units into the skin daily. 7 mL 3   lidocaine-prilocaine (EMLA) cream Apply to affected area once 30 g 3   liraglutide (VICTOZA) 18 MG/3ML SOPN Pt to take 1.8 mg qam.  Pt to increase dose to 1.2 for now to help with appetite. 27 mL 0   loratadine (CLARITIN) 10 MG tablet Take 10 mg by mouth as needed for allergies.     losartan (COZAAR) 50 MG tablet Take 1 tablet (50 mg total) by mouth daily. 90 tablet 3   melatonin 5 MG TABS Take 5 mg by mouth at bedtime.     NEXLIZET 180-10 MG TABS TAKE 1 TABLET BY MOUTH DAILY. 30 tablet 11   nystatin (MYCOSTATIN/NYSTOP) powder Apply 1 application topically as needed.     ondansetron (ZOFRAN) 4 MG tablet Take 1 tablet (4 mg total) by mouth every 8 (eight) hours as needed for nausea or vomiting. 20 tablet 0   Pediatric Multivitamins-Iron (FLINTSTONES PLUS IRON) chewable tablet Chew 1 tablet by mouth 2 (two) times daily.     Probiotic Product ([employer] PROBIOTIC PO) Take 1 capsule by mouth daily.      prochlorperazine (COMPAZINE) 10 MG tablet Take 1  tablet (10 mg total) by mouth every 6 (six) hours as needed (Nausea or vomiting). 30 tablet 1   Propylene Glycol (SYSTANE BALANCE OP) Place 1-2 drops into both eyes 3 (three) times daily as needed (for dry eyes).      senna-docusate (SENOKOT-S) 8.6-50 MG tablet Take 1 tablet by mouth daily as needed for moderate constipation.      SURE COMFORT PEN NEEDLES 32G X 4 MM MISC Use as instructed to inject insulin and Victoza daily. 200 each 0   tobramycin (TOBREX) 0.3 % ophthalmic ointment Place 1 application into both eyes as needed (Takes day before eye injections).     vortioxetine HBr (TRINTELLIX) 20 MG TABS tablet Take 1 tablet (20 mg total) by mouth at bedtime. 90 tablet 0   No current facility-administered medications for this visit.   Medication reconciliation is based on the patient's most recent medication list in the electronic medical record (EMR) including herbal products and OTC medications.   The patient's medication list was reviewed today with the patient? Yes   Drug-drug interactions (DDIs) DDIs were evaluated? Yes DDIs identified? No   Drug-Food Interactions Drug-food interactions were evaluated? Yes Drug-food interactions identified? No   Follow-up Plan  Labs through port for all future appointments per patient request Labs, visit with Dr. Jana Hakim, treatment on 11/16/20 Labs, visit with me, treatment on 12/07/20  [name] participated in the discussion, expressed understanding, and voiced agreement with the above plan. All questions were answered to her satisfaction. The patient was advised to contact the clinic at [phone] with any questions or concerns prior to her return visit.   I spent 60 minutes assessing the patient.  Raina Mina, RPH-CPP, 10/26/2020 3:19 PM

## 2020-10-27 ENCOUNTER — Encounter: Payer: Self-pay | Admitting: Oncology

## 2020-10-27 DIAGNOSIS — Z9884 Bariatric surgery status: Secondary | ICD-10-CM

## 2020-10-27 DIAGNOSIS — C50411 Malignant neoplasm of upper-outer quadrant of right female breast: Secondary | ICD-10-CM

## 2020-10-27 DIAGNOSIS — E1140 Type 2 diabetes mellitus with diabetic neuropathy, unspecified: Secondary | ICD-10-CM

## 2020-10-30 DIAGNOSIS — Z1231 Encounter for screening mammogram for malignant neoplasm of breast: Secondary | ICD-10-CM | POA: Diagnosis not present

## 2020-10-31 ENCOUNTER — Encounter: Payer: Self-pay | Admitting: Oncology

## 2020-11-01 ENCOUNTER — Encounter (HOSPITAL_COMMUNITY): Payer: Self-pay

## 2020-11-01 ENCOUNTER — Encounter: Payer: Self-pay | Admitting: Oncology

## 2020-11-02 ENCOUNTER — Encounter: Payer: Self-pay | Admitting: Oncology

## 2020-11-02 ENCOUNTER — Other Ambulatory Visit: Payer: Self-pay | Admitting: Oncology

## 2020-11-02 ENCOUNTER — Encounter: Payer: Self-pay | Admitting: Nurse Practitioner

## 2020-11-02 ENCOUNTER — Encounter (HOSPITAL_COMMUNITY): Payer: Self-pay

## 2020-11-06 ENCOUNTER — Encounter: Payer: Self-pay | Admitting: Oncology

## 2020-11-07 ENCOUNTER — Telehealth: Payer: Self-pay | Admitting: *Deleted

## 2020-11-07 ENCOUNTER — Encounter: Payer: Self-pay | Admitting: Oncology

## 2020-11-07 ENCOUNTER — Encounter: Payer: Self-pay | Admitting: Nurse Practitioner

## 2020-11-07 DIAGNOSIS — Z9884 Bariatric surgery status: Secondary | ICD-10-CM | POA: Diagnosis not present

## 2020-11-07 DIAGNOSIS — E538 Deficiency of other specified B group vitamins: Secondary | ICD-10-CM | POA: Diagnosis not present

## 2020-11-07 DIAGNOSIS — E1165 Type 2 diabetes mellitus with hyperglycemia: Secondary | ICD-10-CM | POA: Diagnosis not present

## 2020-11-07 DIAGNOSIS — G609 Hereditary and idiopathic neuropathy, unspecified: Secondary | ICD-10-CM | POA: Diagnosis not present

## 2020-11-07 DIAGNOSIS — Z23 Encounter for immunization: Secondary | ICD-10-CM | POA: Diagnosis not present

## 2020-11-07 DIAGNOSIS — N182 Chronic kidney disease, stage 2 (mild): Secondary | ICD-10-CM | POA: Diagnosis not present

## 2020-11-07 DIAGNOSIS — I10 Essential (primary) hypertension: Secondary | ICD-10-CM | POA: Diagnosis not present

## 2020-11-07 MED ORDER — OMEPRAZOLE 40 MG PO CPDR
40.0000 mg | DELAYED_RELEASE_CAPSULE | Freq: Every day | ORAL | 1 refills | Status: DC
Start: 2020-11-07 — End: 2021-11-07

## 2020-11-07 NOTE — Telephone Encounter (Signed)
This RN spoke with pt per her my chart message and post review with Dr Jana Hakim.  Informed her concern over abd issues per MD may be more related to possible GERD ( due to the decadron she is on with chemo ) and noted gallstones on PET may be irritated by her diet. Of note pt is s/p gastric bypass surgery remotely.  Per inquiry she states night time back discomfort is alleviated if she is in a more elevated position vs laying flat.  The vomiting may be first in the morning post rising- or post a meal which she states " I have been eating more meat lately - pork "  Discussed above- and MD recommendation for omeprazole as well as to do a " low fat and low protein - bland " for 2 days and monitor issues.  Darlene [name] verbalized understanding and agreement to plan.  Omeprazole sent to her pharmacy.

## 2020-11-10 ENCOUNTER — Encounter: Payer: Self-pay | Admitting: Podiatry

## 2020-11-12 ENCOUNTER — Encounter: Payer: Self-pay | Admitting: Oncology

## 2020-11-15 NOTE — Progress Notes (Signed)
[facility]  [phone] [phone]) [phone]    ID: Darlene [name] DOB: 1950-04-01  MR#: [id number]  VPX#:[id number]  Patient Care Team: Isaac Bliss, Rayford Halsted, MD as PCP - General (Internal Medicine) Larey Dresser, MD as PCP - Advanced Heart Failure (Cardiology) Stark Klein, MD as Consulting Physician (General Surgery) Allani Reber, Virgie Dad, MD as Consulting Physician (Oncology) Jacelyn Pi, MD as Consulting Physician (Endocrinology) [name], MD as Consulting Physician (Plastic Surgery) Gery Pray, MD as Consulting Physician (Radiation Oncology) Juanita Craver, MD as Consulting Physician (Gastroenterology) Alda Berthold, DO as Consulting Physician (Neurology) Starlyn Skeans, MD as Consulting Physician (Family Medicine) Sherlynn Stalls, MD as Consulting Physician (Ophthalmology) Salvadore Dom, MD as Consulting Physician (Obstetrics and Gynecology) Raina Mina, RPH-CPP (Pharmacist)  OTHER MD:   CHIEF COMPLAINT:  HER-2 positive, estrogen receptor negative breast cancer  CURRENT TREATMENT:  Enhertu   INTERVAL HISTORY:  Darlene [name] returns today for follow-up and treatment of her estrogen receptor negative but HER-2 amplified breast cancer.  She is accompanied by her husband Darlene [name]  She was switched to Enhertu at her last visit on 10/05/2020 given apparent disease progression on PET on 10/03/2020.  She returns today for cycle 3.  Lab Results  Component Value Date   CEA1 82.63 (H) 10/05/2020   CEA1 35.50 (H) 08/30/2020   CEA1 8.24 (H) 07/05/2020   CEA1 3.39 06/07/2020   CEA1 1.81 05/10/2020    REVIEW OF SYSTEMS: Darlene [name] is feeling a bit more fatigued.  She is not short of breath, does not have chest pain or pressure or palpitations and there have been no falls.  She has lost a little bit of weight.  Last week she had some vomiting but she changed her diet and that has resolved, without currently being on any antiemetics.   Bowel movements are now regular and her blood sugar is better controlled.  She saw Dr. York Spaniel and she will increase her Lantus and use a sliding scale for the next 2 or 3 days when she takes her steroids for nausea control.  She is beginning to lose her hair.  She is having some taste alteration.  There is no neuropathy change.  Detailed review of systems was otherwise stable   COVID 19 VACCINATION STATUS: Status post Pfizer x2, with booster September 2021   BREAST CANCER HISTORY: From the original intake note:  "Darlene [name]" had screening mammography at her gynecologist suggestive of a possible abnormality in the right breast. However right diagnostic mammogram 04/12/2013 at [facility] was negative. More recently however, she noted a lump in her right breast and brought it to her gynecologist's attention. On 07/18/2014 the patient had bilateral diagnostic mammography with tomosynthesis at [facility]. The breast density was category B. In the right breast upper outer quadrant there was an area of focal asymmetry with indistinct margins.[name] Ultrasound was obtained and showed in addition to the mass in question, measuring 1.7 cm, an abnormal-appearing lymph node in the right axillary tail.  On 07/19/2014 the patient underwent biopsy of the right breast massin question as well as a right axillary lymph node. Both were positive for invasive ductal carcinoma, grade 2, estrogen and progesterone receptor negative, with an MIB-1 of 90%, and HER-2 pending. Incidentally the lymph node biopsy showed a lymphocytic inflammatory component but no lymph node architecture.  Her subsequent history is as detailed above.   PAST MEDICAL HISTORY: Past Medical History:  Diagnosis Date   Anemia    hx of - during  1st round chemo   Anxiety    Arthritis    in fingers, shoulders   Back pain    Balance problem    Breast cancer (HCC)    Breast cancer of upper-outer quadrant of right female breast ([place]) 07/22/2014   Bunion, right foot     CHF (congestive heart failure) (HCC)    Chronic kidney disease    creatininei levels high per pt    Clumsiness    Constipation    Depression    Diabetes mellitus without complication ([place])    34+ years type 2    Diabetic retinopathy ([place])    torn retina   Eating disorder    binge eating   Epistaxis 08/26/2014   Fatty liver    Foot drop, left foot    HCAP (healthcare-associated pneumonia) 12/18/2014   Heart murmur    never had any problems   History of radiation therapy 04/05/15-05/19/15   right chest wall was treated to 50.4 Gy in 28 fractions, right mastectomy scar was treated to 10 Gy in 5 fractions   Hyperlipidemia    Hypertension    Joint pain    Multiple food allergies    Neuromuscular disorder ([place])    diabetic neuropathy   Obesity    Obstructive sleep apnea on CPAP    does not use cpap all the time has lost 160 lbs    Ovarian cyst rupture    possible   Pneumonia    Pneumonia 11/2014   Radiation 04/05/15-05/19/15   right chest wall 50.4 Gy, mastectomy scar 10 Gy   Sleep apnea    Thyroid nodule 06/2014    PAST SURGICAL HISTORY: Past Surgical History:  Procedure Laterality Date   APPENDECTOMY     BIOPSY THYROID Bilateral 06/2014   2 nodules - Benign    BREAST LUMPECTOMY WITH RADIOACTIVE SEED AND SENTINEL LYMPH NODE BIOPSY Right 12/27/2014   Procedure: BREAST LUMPECTOMY WITH RADIOACTIVE SEED AND SENTINEL LYMPH NODE BIOPSY;  Surgeon: Stark Klein, MD;  Location: [facility];  Service: General;  Laterality: Right;   BREAST REDUCTION SURGERY Bilateral 01/06/2015   Procedure: BILATERAL BREAST REDUCTION, RIGHT ONCOPLASTIC RECONSTRUCTION),LEFT BREAST REDUCTION FOR SYMMETRY;  Surgeon: [name], MD;  Location: [facility];  Service: Plastics;  Laterality: Bilateral;   BREATH TEK H PYLORI N/A 09/15/2012   Procedure: BREATH TEK H PYLORI;  Surgeon: Pedro Earls, MD;  Location: [facility];  Service: General;  Laterality: N/A;   BUNIONECTOMY Left 12/2013   CARPAL  TUNNEL RELEASE Right    CARPAL TUNNEL RELEASE Left    CATARACT EXTRACTION     6/19   COLONOSCOPY     DUPUYTREN CONTRACTURE RELEASE     GASTRIC ROUX-EN-Y N/A 11/02/2012   Procedure: LAPAROSCOPIC ROUX-EN-Y GASTRIC;  Surgeon: Pedro Earls, MD;  [facility]: [facility];  Service: General;  Laterality: N/A;   IR GENERIC HISTORICAL  03/18/2016   IR US GUIDE VASC ACCESS LEFT 03/18/2016 Markus Daft, MD [facility]   IR GENERIC HISTORICAL  03/18/2016   IR FLUORO GUIDE CV LINE LEFT 03/18/2016 Markus Daft, MD [facility]   LAPAROSCOPIC APPENDECTOMY N/A 07/22/2015   Procedure: APPENDECTOMY LAPAROSCOPIC;  Surgeon: Michael Boston, MD;  [facility];  Service: General;  Laterality: N/A;   MASTECTOMY Right 02/15/2015   modified   MODIFIED MASTECTOMY Right 02/15/2015   Procedure: RIGHT MODIFIED RADICAL MASTECTOMY;  Surgeon: Stark Klein, MD;  Location: [facility];  Service: General;  Laterality: Right;   ORIF WRIST FRACTURE  Left 05/11/2019   Procedure: OPEN REDUCTION INTERNAL FIXATION (ORIF) left distal radius fracture;  Surgeon: Renette Butters, MD;  Location: [facility];  Service: Orthopedics;  Laterality: Left;  with block   PORT-A-CATH REMOVAL Left 10/10/2015   Procedure: PORT REMOVAL;  Surgeon: Stark Klein, MD;  Location: [facility];  Service: General;  Laterality: Left;   PORTACATH PLACEMENT Left 08/02/2014   Procedure: INSERTION PORT-A-CATH;  Surgeon: Stark Klein, MD;  Location: [facility];  Service: General;  Laterality: Left;   PORTACATH PLACEMENT N/A 11/05/2016   Procedure: INSERTION PORT-A-CATH;  Surgeon: Stark Klein, MD;  Location: [facility];  Service: General;  Laterality: N/A;   RETINAL LASER PROCEDURE     retina tear on left eye   ROTATOR CUFF REPAIR Right 2005   SCAR REVISION Right 12/08/2015   Procedure: COMPLEX REPAIR RIGHT CHEST 10-15CM;  Surgeon: [name], MD;  Location: [facility];  Service: Plastics;  Laterality: Right;   TOE SURGERY  1970s   bone spur   TRIGGER  FINGER RELEASE     x3    FAMILY HISTORY Family History  Problem Relation Age of Onset   CVA Mother    Heart disease Mother    Sudden death Mother    Diabetes Father    Heart failure Father    Hypertension Father    Kidney disease Father    Obesity Father    Hypertension Sister    Diabetes Brother    Breast cancer Paternal Grandmother 77   Diabetes Paternal Uncle    Ovarian cancer Neg Hx   The patient's father died from complications of diabetes at age 37. The patient's mother died at age 52 with a subarachnoid hemorrhage. The patient had one brother, one sister. The only breast cancer was the patient's paternal grandmother diagnosed in her 69s. There is no history of ovarian cancer in the family.   GYNECOLOGIC HISTORY:  Patient's last menstrual period was 10/22/2007.  menarche age 36, the patient is GX P0. She went through the change of life in 2011. She did not take hormone replacement.   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 Her husband Darlene [name] is a retired Visual merchandiser (he taught at [employer]). He was diagnosed with prostate cancer in 2021. In May 2020 they moved to [facility] in the [place].  Darlene [name] has 2 children from an earlier marriage, Rockey Situ who teaches in [place], and Dreama Kuna,  who works at the [employer] in in [place]. He has 1 grand son, aged 46. The patient and her husband are not church attenders.   ADVANCED DIRECTIVES:  Not in place   HEALTH MAINTENANCE: Social History   Tobacco Use   Smoking status: Never   Smokeless tobacco: Never  Vaping Use   Vaping Use: Never used  Substance Use Topics   Alcohol use: Yes    Alcohol/week: 1.0 - 2.0 standard drink    Types: 1 - 2 Standard drinks or equivalent per week    Comment: social   Drug use: No     Colonoscopy:  May  2016  PAP:  Bone density: 07/01/2016 at [facility] showed normal, with a T score of -0.7  Lipid panel:  Allergies  Allergen Reactions   Nsaids Other (See Comments)    H/o gastric bypass - avoid NSAIDs   Tape Hives,  Rash and Other (See Comments)    Adhesives in bandaids    Current Outpatient Medications  Medication Sig Dispense Refill   Accu-Chek Softclix Lancets lancets 1 each by Other route 4 (four) times daily. Test blood sugars 4 x times daily 200 each 1   aspirin EC 81 MG tablet Take 1 tablet (81 mg total) by mouth daily. 90 tablet 3   Blood Glucose Calibration (ACCU-CHEK GUIDE CONTROL) LIQD 1 Bottle by In Vitro route as needed. 1 each 0   Blood Glucose Monitoring Suppl (ACCU-CHEK GUIDE) w/Device KIT 1 each by Does not apply route 4 (four) times daily. Use to test blood sugars 4x a day 1 kit 0   buPROPion (WELLBUTRIN SR) 200 MG 12 hr tablet Take one tablet by mouth once daily. 90 tablet 0   CALCIUM CITRATE-VITAMIN D PO Take 1 tablet by mouth 3 (three) times daily.      carvedilol (COREG) 6.25 MG tablet Take 1 tablet (6.25 mg total) by mouth 2 (two) times daily. 180 tablet 3   cholecalciferol (VITAMIN D) 1000 units tablet Take 1,000 Units by mouth daily.     cholestyramine (QUESTRAN) 4 g packet Take 1 packet (4 g total) by mouth 2 (two) times daily as needed. 10 each 12   Cyanocobalamin (VITAMIN B-12 PO) Take 1 tablet by mouth every 14 (fourteen) days.     dapagliflozin propanediol (FARXIGA) 10 MG TABS tablet Take 1 tablet (10 mg total) by mouth daily before breakfast. 30 tablet 11   dexamethasone (DECADRON) 4 MG tablet Take 2 tablets (8 mg total) by mouth daily. Start the day after chemotherapy for 2 days. 30 tablet 1   diphenhydrAMINE (BENADRYL) 25 MG tablet Take 25 mg by mouth daily as needed for allergies or sleep.      glucose blood (ACCU-CHEK GUIDE) test strip Use Accu Chek test strips to check blood sugar 4 times daily. DX:E11.21 400 each 0   insulin glargine (LANTUS SOLOSTAR) 100  UNIT/ML Solostar Pen Inject 8 Units into the skin daily. 7 mL 3   lidocaine-prilocaine (EMLA) cream Apply to affected area once 30 g 3   liraglutide (VICTOZA) 18 MG/3ML SOPN Pt to take 1.8 mg qam.  Pt to increase dose to 1.2 for now to help with appetite. 27 mL 0   loratadine (CLARITIN) 10 MG tablet Take 10 mg by mouth as needed for allergies.     losartan (COZAAR) 50 MG tablet Take 1 tablet (50 mg total) by mouth daily. 90 tablet 3   melatonin 5 MG TABS Take 5 mg by mouth at bedtime.     NEXLIZET 180-10 MG TABS TAKE 1 TABLET BY MOUTH DAILY. 30 tablet 11   nystatin (MYCOSTATIN/NYSTOP) powder Apply 1 application topically as needed.     omeprazole (PRILOSEC) 40 MG capsule Take 1 capsule (40 mg total) by mouth daily. 30 capsule 1   ondansetron (ZOFRAN) 4 MG tablet Take 1 tablet (4 mg total) by mouth every 8 (eight) hours as needed for nausea or vomiting. 20 tablet 0   Pediatric Multivitamins-Iron (FLINTSTONES PLUS IRON) chewable tablet Chew 1 tablet by mouth 2 (two) times daily.     Probiotic Product ([employer] PROBIOTIC PO) Take 1 capsule by mouth daily.      prochlorperazine (COMPAZINE) 10 MG tablet Take 1 tablet (10 mg total) by mouth every 6 (six) hours as needed (Nausea or vomiting). 30 tablet 1   Propylene Glycol (SYSTANE BALANCE OP) Place 1-2 drops into both eyes 3 (three) times  daily as needed (for dry eyes).      senna-docusate (SENOKOT-S) 8.6-50 MG tablet Take 1 tablet by mouth daily as needed for moderate constipation.      SURE COMFORT PEN NEEDLES 32G X 4 MM MISC Use as instructed to inject insulin and Victoza daily. 200 each 0   tobramycin (TOBREX) 0.3 % ophthalmic ointment Place 1 application into both eyes as needed (Takes day before eye injections).     vortioxetine HBr (TRINTELLIX) 20 MG TABS tablet Take 1 tablet (20 mg total) by mouth at bedtime. 90 tablet 0   No current facility-administered medications for this visit.    OBJECTIVE: White woman who appears stated age  16:    11/16/20 1026  BP: 116/76  Pulse: 77  Resp: 18  Temp: 97.9 F (36.6 C)  SpO2: 99%     Filed Weights   11/16/20 1026  Weight: 165 lb 3.2 oz (74.9 kg)    Body mass index is 24.4 kg/m.     ECOG FS:1 - Symptomatic but completely ambulatory    Sclerae unicteric, EOMs intact Wearing a mask No cervical or supraclavicular adenopathy Lungs no rales or rhonchi Heart regular rate and rhythm Abd soft, nontender, positive bowel sounds MSK no focal spinal tenderness, no upper extremity lymphedema Neuro: nonfocal, well oriented, appropriate affect Breasts: The right breast is status postmastectomy.  There is no evidence of chest wall recurrence.  The left breast is benign.  Both axillae are benign.   LAB RESULTS:  CMP     Component Value Date/Time   NA 139 10/26/2020 1351   NA 145 (H) 01/04/2020 0951   NA 140 01/07/2017 0758   K 4.6 10/26/2020 1351   K 4.1 01/07/2017 0758   CL 110 10/26/2020 1351   CO2 23 10/26/2020 1351   CO2 24 01/07/2017 0758   GLUCOSE 107 (H) 10/26/2020 1351   GLUCOSE 134 01/07/2017 0758   BUN 51 (H) 10/26/2020 1351   BUN 33 (H) 01/04/2020 0951   BUN 33.6 (H) 01/07/2017 0758   CREATININE 2.32 (H) 10/26/2020 1351   CREATININE 1.5 (H) 01/07/2017 0758   CALCIUM 8.7 (L) 10/26/2020 1351   CALCIUM 9.0 04/12/2020 0923   CALCIUM 9.3 01/07/2017 0758   PROT 5.9 (L) 10/26/2020 1351   PROT 6.3 01/04/2020 0951   PROT 6.7 01/07/2017 0758   ALBUMIN 3.3 (L) 10/26/2020 1351   ALBUMIN 4.3 01/04/2020 0951   ALBUMIN 3.6 01/07/2017 0758   AST 17 10/26/2020 1351   AST 51 (H) 01/07/2017 0758   ALT 9 10/26/2020 1351   ALT 82 (H) 01/07/2017 0758   ALKPHOS 47 10/26/2020 1351   ALKPHOS 115 01/07/2017 0758   BILITOT 0.4 10/26/2020 1351   BILITOT 0.72 01/07/2017 0758   GFRNONAA 22 (L) 10/26/2020 1351   GFRNONAA 55 (L) 02/18/2013 0827   GFRAA 36 (L) 01/04/2020 0951   GFRAA 38 (L) 04/28/2018 0915   GFRAA 63 02/18/2013 0827    INo results found for: SPEP, UPEP  Lab  Results  Component Value Date   WBC 3.4 (L) 11/16/2020   NEUTROABS 1.8 11/16/2020   HGB 8.8 (L) 11/16/2020   HCT 25.8 (L) 11/16/2020   MCV 97.0 11/16/2020   PLT 280 11/16/2020      Chemistry      Component Value Date/Time   NA 139 10/26/2020 1351   NA 145 (H) 01/04/2020 0951   NA 140 01/07/2017 0758   K 4.6 10/26/2020 1351   K 4.1 01/07/2017 0758  CL 110 10/26/2020 1351   CO2 23 10/26/2020 1351   CO2 24 01/07/2017 0758   BUN 51 (H) 10/26/2020 1351   BUN 33 (H) 01/04/2020 0951   BUN 33.6 (H) 01/07/2017 0758   CREATININE 2.32 (H) 10/26/2020 1351   CREATININE 1.5 (H) 01/07/2017 0758      Component Value Date/Time   CALCIUM 8.7 (L) 10/26/2020 1351   CALCIUM 9.0 04/12/2020 0923   CALCIUM 9.3 01/07/2017 0758   ALKPHOS 47 10/26/2020 1351   ALKPHOS 115 01/07/2017 0758   AST 17 10/26/2020 1351   AST 51 (H) 01/07/2017 0758   ALT 9 10/26/2020 1351   ALT 82 (H) 01/07/2017 0758   BILITOT 0.4 10/26/2020 1351   BILITOT 0.72 01/07/2017 0758       No results found for: LABCA2  No components found for: [id number]  No results for input(s): INR in the last 168 hours.  Urinalysis    Component Value Date/Time   COLORURINE YELLOW 07/21/2015 [place] 07/21/2015 2328   LABSPEC 1.020 07/21/2015 2328   PHURINE 6.0 07/21/2015 2328   GLUCOSEU NEGATIVE 07/21/2015 2328   HGBUR TRACE (A) 07/21/2015 2328   BILIRUBINUR NEGATIVE 07/21/2015 2328   BILIRUBINUR neg 06/29/2013 1017   KETONESUR NEGATIVE 07/21/2015 2328   PROTEINUR 30 (A) 07/21/2015 2328   UROBILINOGEN negative 06/29/2013 1017   NITRITE NEGATIVE 07/21/2015 2328   LEUKOCYTESUR NEGATIVE 07/21/2015 2328    STUDIES: No results found.    ASSESSMENT: 70 y.o. [place] woman status post right breast upper outer quadrant and right axillary lymph node biopsy 07/19/2014 for a cT2  pN1, stage IIB  invasive ductal carcinoma, grade 2, estrogen and progesterone receptor negative, with an MIB-1 of 90%, and HER-2  amplified  (1) neoadjuvant chemotherapy started 08/08/2014, consisting of carboplatin, docetaxel, trastuzumab and pertuzumab given every 3 weeks 6, completed 11/21/2014  (a) breast MRI after initial 3 cycles shows a significant response  (b) breast MRI after 6 cycles shows a complete radiologic response   (2) trastuzumab continued through 10/02/2015 (to end of capecitabine therapy)  (a) echo 02/13/2015 shows an EF of 60-65%  (b)  echo 05/09/2015 shows an ejection fraction of 60-65%  (c) echocardiogram 06/27/2015 shows an ejection fraction of 55-60%.  (d) echocardiogram 01/22/2017 shows an ejection fraction of 55-60%  (3) right lumpectomy with sentinel lymph node biopsy on 12/27/14 showed a residual  ypT2 ypN1a, stage IIB invasive ductal carcinoma, grade 2, with repeat estrogen and progesterone receptors again negative  (4) status post right oncoplastic surgery (with left reduction mammoplasty) 01/06/2015 showing in the right breast multiple foci of intralymphatic carcinoma in random sections  (5)  Right modified radical mastectomy 02/15/2014 showed no residual invasive disease; there was DCIS but margins were negative; all 15 axillary lymph nodes were clear; ypTis ypN0  (a)  The patient has decided against reconstruction.  (6)  Postmastectomy radiation completed 05/18/2015 with capecitabine sensitization  (7) adjuvant capecitabine continued through 09/27/2015  (8) hepatic steatosis with increased fibrosis score (at risk for cirrhosis)  (9) thyroid biopsy (left lobe inferiorly) 2 shows benign tissue 06/28/2015  (10) LOCAL RECURRENCE:  (a) PET scan 12/06/2015 Notes a right retropectoral lymph node measuring 0.9 cm with an SUV max of 5.5  (b) repeat PET scan 03/11/2016 finds the right subpectoral lymph node to now measure 1.3 cm with an SUV max of 11.8.  (c) PET scan 05/07/2016 shows a significant response to T-DM 1  (d) PET scan 07/10/2017 is clear except for  what appears to be a single  new liver lesion   (11) started T-DM1 03/21/2016, completed 8 doses 08/13/2016  (a) PET scan 08/19/2016 shows a good response but measurable residual disease  (b) PET scan on 01/16/2017 shows a continuing response  (12) maintenance trastuzumab started 09/03/2016--changed to every 28 days as of 01/28/2017  (a) repeat echocardiogram 07/08/2016 shows an ejection fraction of 60-65%.  (b) echocardiogram on 10/08/2016 demonstrates LVEF of 55-60% (followed by Dr. Haroldine Laws)  (c) echo 01/22/2017 shows an ejection fraction in the 55-60% range  (d) echo 04/24/2017 shows an ejection fraction in the 55-60% range (recommendation for echo every 6 months by Dr. Aundra Dubin)  (e) trastuzumab changed to T-DM 1 after trastuzumab dose 07/15/2017  METASTATIC DISEASE: July 2019 (13)  Radiation to the area of persistent disease in the right upper chest completed 10/30/2016   (14) T-DM 1 resumed 08/12/2017, repeated every 21 days  (a) baseline liver MRI 08/02/2017 shows a 1.4 cm mass, "hot" on PET 07/10/2017  (b) repeat liver MRI 11/18/2017 after 3 cycles of T-DM 1, shows slight enlargement  (c) T-DM 1 discontinued after 09/23/2017 dose  (15) trastuzumab resumed 11/18/2017, to be repeated every 4 weeks,   (a) changed back to every 3 weeks in 03/2018  (b) echo 09/18/2018 shows an ejection fraction in the 50-55% range.  (c) pertuzumab added with 10/06/2018 dose  (d) trastuzumab/pertuzumab changed to every 4 weeks after 01/19/2019 dose  (e) liver MRI 04/27/2019 shows mild sclerosis, no change in right hepatic lobe treated metastases, with no new lesions; prominent porta hepatis nodes were felt likely reactive  (f) abdominal ultrasound 06/28/2027 shows no focal liver lesions, no ascites  (g) liver MRI 09/20/2019 shows stable disease (0.6 cm).  (16) liver biopsy 11/03/2017 shows adenocarcinoma, most consistent with an upper gastrointestinal primary; this tumor was estrogen receptor and HER-2 negative  (a) GI workup for  occult primary ([place]) negative  (b) PET 12/09/2017 shows no obvious primary.  There is a right subpectoral lymph node noted as well  (c) CA 27-29 and CA 19-9 uninformative; CEA 6.72 OCT 2019  (d) negative hepatitis B serologies May 2021   (17) started Abraxane on 12/111/2019, given days 1 and 8 on a 21 day cycle   (a) restaging with MRI liver 02/24/2018 shows a gratifying initial response  (b) PET scan 05/14/2018 shows resolution of the measurable disease in the liver  (c) liver MRI 09/14/2018 shows no change compared to prior PET and MRI.  (d) Abraxane discontinued after 09/01/2018 dose  (18) continues on pertuzumab and trastuzumab every 4 weeks (see #15 above)   (a) MRI of the abdomen 05/07/2020 shows no measurable disease  (b) repeat abdominal MRI 09/13/2020 shows a new 3.4 cm right subcapsular lower liver lesion (this measured 0.6 cm August 2021 and was not seen April 2022)  (c) PET scan 10/03/2020 shows the right liver dome lesion which is highly hypermetabolic  (19) started Enhertu 10/05/2020, repeated every 21 days.   PLAN: Darlene [name] is tolerating Enhertu moderately well and the plan is to proceed to cycle #3 today.  She is becoming anemic.  At this point she does not need a transfusion but she very likely will with her next cycle, 3 weeks from now.  Accordingly I have added a type and screen to be drawn that day and I am tentatively setting her up for transfusion of 2 units of packed cells the day after that cycle.  I encouraged her to follow with Dr. Sharma Covert recommendations  regarding glucose management for the next 3 days.  She has unfortunately lost a close friend from college and was considering going to the celebration of life in [place].  I really do not think that is a good idea.  We discussed alternatives today.  I think it would be difficult and actually dangerous both for her and Darlene [name] to go.  At least that would be my recommendation  She will return to see [name] in 3 weeks  and I will see her again in 6 weeks.  Before that visit, which would be her fifth cycle of Enhertu, she will be restaged with a liver MRI  Total encounter time 30 minutes.Sarajane Jews C. Jovaun Levene, MD 11/16/20 10:51 AM Medical Oncology and Hematology [facility] [place], [place] [address] Tel. [phone]    Fax. [phone]   I, Wilburn Mylar, am acting as scribe for Dr. Virgie Dad. Sondos Wolfman.  I, Lurline Del MD, have reviewed the above documentation for accuracy and completeness, and I agree with the above.   *Total Encounter Time as defined by the Centers for Medicare and Medicaid Services includes, in addition to the face-to-face time of a patient visit (documented in the note above) non-face-to-face time: obtaining and reviewing outside history, ordering and reviewing medications, tests or procedures, care coordination (communications with other health care professionals or caregivers) and documentation in the medical record.

## 2020-11-16 ENCOUNTER — Inpatient Hospital Stay: Payer: Medicare PPO

## 2020-11-16 ENCOUNTER — Other Ambulatory Visit: Payer: Self-pay

## 2020-11-16 ENCOUNTER — Inpatient Hospital Stay: Payer: Medicare PPO | Admitting: Oncology

## 2020-11-16 VITALS — BP 116/76 | HR 77 | Temp 97.9°F | Resp 18 | Ht 69.0 in | Wt 165.2 lb

## 2020-11-16 DIAGNOSIS — D649 Anemia, unspecified: Secondary | ICD-10-CM | POA: Diagnosis not present

## 2020-11-16 DIAGNOSIS — C50411 Malignant neoplasm of upper-outer quadrant of right female breast: Secondary | ICD-10-CM | POA: Diagnosis not present

## 2020-11-16 DIAGNOSIS — C787 Secondary malignant neoplasm of liver and intrahepatic bile duct: Secondary | ICD-10-CM

## 2020-11-16 DIAGNOSIS — Z7189 Other specified counseling: Secondary | ICD-10-CM

## 2020-11-16 DIAGNOSIS — C50811 Malignant neoplasm of overlapping sites of right female breast: Secondary | ICD-10-CM | POA: Diagnosis not present

## 2020-11-16 DIAGNOSIS — Z5112 Encounter for antineoplastic immunotherapy: Secondary | ICD-10-CM | POA: Diagnosis not present

## 2020-11-16 DIAGNOSIS — Z171 Estrogen receptor negative status [ER-]: Secondary | ICD-10-CM | POA: Diagnosis not present

## 2020-11-16 DIAGNOSIS — Z95828 Presence of other vascular implants and grafts: Secondary | ICD-10-CM

## 2020-11-16 LAB — CMP (CANCER CENTER ONLY)
ALT: 10 U/L (ref 0–44)
AST: 18 U/L (ref 15–41)
Albumin: 3.1 g/dL — ABNORMAL LOW (ref 3.5–5.0)
Alkaline Phosphatase: 50 U/L (ref 38–126)
Anion gap: 9 (ref 5–15)
BUN: 33 mg/dL — ABNORMAL HIGH (ref 8–23)
CO2: 22 mmol/L (ref 22–32)
Calcium: 8.8 mg/dL — ABNORMAL LOW (ref 8.9–10.3)
Chloride: 111 mmol/L (ref 98–111)
Creatinine: 1.97 mg/dL — ABNORMAL HIGH (ref 0.44–1.00)
GFR, Estimated: 27 mL/min — ABNORMAL LOW (ref >60)
Glucose, Bld: 174 mg/dL — ABNORMAL HIGH (ref 70–99)
Potassium: 4.2 mmol/L (ref 3.5–5.1)
Sodium: 142 mmol/L (ref 135–145)
Total Bilirubin: 0.5 mg/dL (ref 0.3–1.2)
Total Protein: 5.8 g/dL — ABNORMAL LOW (ref 6.5–8.1)

## 2020-11-16 LAB — CBC WITH DIFFERENTIAL (CANCER CENTER ONLY)
Abs Immature Granulocytes: 0.03 10*3/uL (ref 0.00–0.07)
Basophils Absolute: 0 10*3/uL (ref 0.0–0.1)
Basophils Relative: 1 %
Eosinophils Absolute: 0.2 10*3/uL (ref 0.0–0.5)
Eosinophils Relative: 6 %
HCT: 25.8 % — ABNORMAL LOW (ref 36.0–46.0)
Hemoglobin: 8.8 g/dL — ABNORMAL LOW (ref 12.0–15.0)
Immature Granulocytes: 1 %
Lymphocytes Relative: 28 %
Lymphs Abs: 1 10*3/uL (ref 0.7–4.0)
MCH: 33.1 pg (ref 26.0–34.0)
MCHC: 34.1 g/dL (ref 30.0–36.0)
MCV: 97 fL (ref 80.0–100.0)
Monocytes Absolute: 0.4 10*3/uL (ref 0.1–1.0)
Monocytes Relative: 11 %
Neutro Abs: 1.8 10*3/uL (ref 1.7–7.7)
Neutrophils Relative %: 53 %
Platelet Count: 280 10*3/uL (ref 150–400)
RBC: 2.66 MIL/uL — ABNORMAL LOW (ref 3.87–5.11)
RDW: 14.8 % (ref 11.5–15.5)
WBC Count: 3.4 10*3/uL — ABNORMAL LOW (ref 4.0–10.5)
nRBC: 0 % (ref 0.0–0.2)

## 2020-11-16 LAB — CEA (IN HOUSE-CHCC): CEA (CHCC-In House): 11.97 ng/mL — ABNORMAL HIGH (ref 0.00–5.00)

## 2020-11-16 MED ORDER — SODIUM CHLORIDE 0.9% FLUSH
10.0000 mL | Freq: Once | INTRAVENOUS | Status: AC
  Administered 2020-11-16: 10 mL

## 2020-11-16 MED ORDER — DEXTROSE 5 % IV SOLN: Freq: Once | INTRAVENOUS | Status: AC

## 2020-11-16 MED ORDER — FAM-TRASTUZUMAB DERUXTECAN-NXKI CHEMO 100 MG IV SOLR
5.2500 mg/kg | Freq: Once | INTRAVENOUS | Status: AC
  Administered 2020-11-16: 400 mg via INTRAVENOUS
  Filled 2020-11-16: qty 20

## 2020-11-16 MED ORDER — SODIUM CHLORIDE 0.9 % IV SOLN
10.0000 mg | Freq: Once | INTRAVENOUS | Status: AC
  Administered 2020-11-16: 10 mg via INTRAVENOUS
  Filled 2020-11-16: qty 10

## 2020-11-16 MED ORDER — ACETAMINOPHEN 325 MG PO TABS
650.0000 mg | ORAL_TABLET | Freq: Once | ORAL | Status: AC
  Administered 2020-11-16: 650 mg via ORAL
  Filled 2020-11-16: qty 2

## 2020-11-16 MED ORDER — DIPHENHYDRAMINE HCL 25 MG PO CAPS
25.0000 mg | ORAL_CAPSULE | Freq: Once | ORAL | Status: AC
  Administered 2020-11-16: 25 mg via ORAL
  Filled 2020-11-16: qty 1

## 2020-11-16 MED ORDER — PALONOSETRON HCL INJECTION 0.25 MG/5ML
0.2500 mg | Freq: Once | INTRAVENOUS | Status: AC
  Administered 2020-11-16: 0.25 mg via INTRAVENOUS
  Filled 2020-11-16: qty 5

## 2020-11-16 NOTE — Patient Instructions (Signed)
[facility]  Discharge Instructions: ?Thank you for choosing [facility] to provide your oncology and hematology care.  ? ?If you have a lab appointment with the Cancer Center, please go directly to the Cancer Center and check in at the registration area. ?  ?Wear comfortable clothing and clothing appropriate for easy access to any Portacath or PICC line.  ? ?We strive to give you quality time with your provider. You may need to reschedule your appointment if you arrive late (15 or more minutes).  Arriving late affects you and other patients whose appointments are after yours.  Also, if you miss three or more appointments without notifying the office, you may be dismissed from the clinic at the provider?s discretion.    ?  ?For prescription refill requests, have your pharmacy contact our office and allow 72 hours for refills to be completed.   ? ?Today you received the following chemotherapy and/or immunotherapy agents: Enhertu.    ?  ?To help prevent nausea and vomiting after your treatment, we encourage you to take your nausea medication as directed. ? ?BELOW ARE SYMPTOMS THAT SHOULD BE REPORTED IMMEDIATELY: ?*FEVER GREATER THAN 100.4 F (38 ?C) OR HIGHER ?*CHILLS OR SWEATING ?*NAUSEA AND VOMITING THAT IS NOT CONTROLLED WITH YOUR NAUSEA MEDICATION ?*UNUSUAL SHORTNESS OF BREATH ?*UNUSUAL BRUISING OR BLEEDING ?*URINARY PROBLEMS (pain or burning when urinating, or frequent urination) ?*BOWEL PROBLEMS (unusual diarrhea, constipation, pain near the anus) ?TENDERNESS IN MOUTH AND THROAT WITH OR WITHOUT PRESENCE OF ULCERS (sore throat, sores in mouth, or a toothache) ?UNUSUAL RASH, SWELLING OR PAIN  ?UNUSUAL VAGINAL DISCHARGE OR ITCHING  ? ?Items with * indicate a potential emergency and should be followed up as soon as possible or go to the Emergency Department if any problems should occur. ? ?Please show the CHEMOTHERAPY ALERT CARD or IMMUNOTHERAPY ALERT CARD at check-in to  the Emergency Department and triage nurse. ? ?Should you have questions after your visit or need to cancel or reschedule your appointment, please contact [facility]  Dept: [phone]  and follow the prompts.  Office hours are 8:00 a.m. to 4:30 p.m. Monday - Friday. Please note that voicemails left after 4:00 p.m. may not be returned until the following business day.  We are closed weekends and major holidays. You have access to a nurse at all times for urgent questions. Please call the main number to the clinic Dept: [phone] and follow the prompts. ? ? ?For any non-urgent questions, you may also contact your provider using MyChart. We now offer e-Visits for anyone 18 and older to request care online for non-urgent symptoms. For details visit [email or web address]. ?  ?Also download the MyChart app! Go to the app store, search "MyChart", open the app, select [facility], and log in with your MyChart username and password. ? ?Due to Covid, a mask is required upon entering the hospital/clinic. If you do not have a mask, one will be given to you upon arrival. For doctor visits, patients may have 1 support person aged 18 or older with them. For treatment visits, patients cannot have anyone with them due to current Covid guidelines and our immunocompromised population.  ? ?

## 2020-11-16 NOTE — Progress Notes (Signed)
Ok to treat with elevated Scr. Today per Dr. Jana Hakim

## 2020-11-21 DIAGNOSIS — E1165 Type 2 diabetes mellitus with hyperglycemia: Secondary | ICD-10-CM | POA: Diagnosis not present

## 2020-11-21 DIAGNOSIS — I10 Essential (primary) hypertension: Secondary | ICD-10-CM | POA: Diagnosis not present

## 2020-11-22 ENCOUNTER — Other Ambulatory Visit: Payer: Self-pay

## 2020-11-22 DIAGNOSIS — E113513 Type 2 diabetes mellitus with proliferative diabetic retinopathy with macular edema, bilateral: Secondary | ICD-10-CM | POA: Diagnosis not present

## 2020-11-22 DIAGNOSIS — H3582 Retinal ischemia: Secondary | ICD-10-CM | POA: Diagnosis not present

## 2020-11-23 ENCOUNTER — Ambulatory Visit (INDEPENDENT_AMBULATORY_CARE_PROVIDER_SITE_OTHER): Payer: Medicare PPO | Admitting: Internal Medicine

## 2020-11-23 ENCOUNTER — Encounter: Payer: Self-pay | Admitting: Internal Medicine

## 2020-11-23 VITALS — BP 110/68 | HR 64 | Temp 97.8°F | Ht 69.0 in | Wt 165.3 lb

## 2020-11-23 DIAGNOSIS — N184 Chronic kidney disease, stage 4 (severe): Secondary | ICD-10-CM | POA: Diagnosis not present

## 2020-11-23 DIAGNOSIS — E559 Vitamin D deficiency, unspecified: Secondary | ICD-10-CM

## 2020-11-23 DIAGNOSIS — G4733 Obstructive sleep apnea (adult) (pediatric): Secondary | ICD-10-CM

## 2020-11-23 DIAGNOSIS — I10 Essential (primary) hypertension: Secondary | ICD-10-CM

## 2020-11-23 DIAGNOSIS — Z9989 Dependence on other enabling machines and devices: Secondary | ICD-10-CM | POA: Diagnosis not present

## 2020-11-23 DIAGNOSIS — Z0000 Encounter for general adult medical examination without abnormal findings: Secondary | ICD-10-CM

## 2020-11-23 DIAGNOSIS — C50411 Malignant neoplasm of upper-outer quadrant of right female breast: Secondary | ICD-10-CM | POA: Diagnosis not present

## 2020-11-23 DIAGNOSIS — E11311 Type 2 diabetes mellitus with unspecified diabetic retinopathy with macular edema: Secondary | ICD-10-CM

## 2020-11-23 LAB — POCT GLYCOSYLATED HEMOGLOBIN (HGB A1C): Hemoglobin A1C: 6.3 % — AB (ref 4.0–5.6)

## 2020-11-23 NOTE — Progress Notes (Signed)
Established Patient Office Visit     This visit occurred during the SARS-CoV-2 public health emergency.  Safety protocols were in place, including screening questions prior to the visit, additional usage of staff PPE, and extensive cleaning of exam room while observing appropriate contact time as indicated for disinfecting solutions.    CC/Reason for Visit: Annual preventive exam and subsequent Medicare wellness visit  HPI: Darlene [name] is a 70 y.o. female who is coming in today for the above mentioned reasons.  She has an extensive past medical history: She has a history of hypertension, aortic atherosclerosis, cardiomyopathy secondary to chemotherapeutic agent, she has a history of breast cancer status post right-sided mastectomy, chemotherapy, radiation with what appears to be possible biochemical recurrence, scheduled to see oncology in 2 weeks.  She has a history of obstructive sleep apnea on CPAP.  Stage IV chronic kidney disease presumably due to diabetes, type 2 diabetes that has been well controlled, hyperlipidemia, depression.  She has significant peripheral neuropathy secondary to chemotherapy.  She started to have balance issues and is walking with a cane.  She has a history of obesity and is status post a Roux-en-Y gastric bypass in 2014. Her oncologist is Dr. Jana Hakim, obesity medicine specialist is Dr. Leafy Ro, her cardiologist is Dr. Aundra Dubin, neurologist is Dr. Posey Pronto, nephrologist is Dr. Johnney Ou, she also has a psychiatrist.  She is feeling well today and has no acute concerns or complaints.  She was told at her last chemo appointment that she would probably need a transfusion at her next visit which is in 10 days.  She has diabetic retinopathy with macular edema and follows with the retina specialist.  She has routine eye and dental care.  All immunizations are up-to-date with the exception of shingles.  Cancer screening is up-to-date.  Past Medical/Surgical  History: Past Medical History:  Diagnosis Date   Anemia    hx of - during 1st round chemo   Anxiety    Arthritis    in fingers, shoulders   Back pain    Balance problem    Breast cancer ([place])    Breast cancer of upper-outer quadrant of right female breast ([place]) 07/22/2014   Bunion, right foot    CHF (congestive heart failure) (HCC)    Chronic kidney disease    creatininei levels high per pt    Clumsiness    Constipation    Depression    Diabetes mellitus without complication ([place])    74+ years type 2    Diabetic retinopathy ([place])    torn retina   Eating disorder    binge eating   Epistaxis 08/26/2014   Fatty liver    Foot drop, left foot    HCAP (healthcare-associated pneumonia) 12/18/2014   Heart murmur    never had any problems   History of radiation therapy 04/05/15-05/19/15   right chest wall was treated to 50.4 Gy in 28 fractions, right mastectomy scar was treated to 10 Gy in 5 fractions   Hyperlipidemia    Hypertension    Joint pain    Multiple food allergies    Neuromuscular disorder ([place])    diabetic neuropathy   Obesity    Obstructive sleep apnea on CPAP    does not use cpap all the time has lost 160 lbs    Ovarian cyst rupture    possible   Pneumonia    Pneumonia 11/2014   Radiation 04/05/15-05/19/15   right chest wall 50.4 Gy, mastectomy scar  10 Gy   Sleep apnea    Thyroid nodule 06/2014    Past Surgical History:  Procedure Laterality Date   APPENDECTOMY     BIOPSY THYROID Bilateral 06/2014   2 nodules - Benign    BREAST LUMPECTOMY WITH RADIOACTIVE SEED AND SENTINEL LYMPH NODE BIOPSY Right 12/27/2014   Procedure: BREAST LUMPECTOMY WITH RADIOACTIVE SEED AND SENTINEL LYMPH NODE BIOPSY;  Surgeon: Stark Klein, MD;  Location: [facility];  Service: General;  Laterality: Right;   BREAST REDUCTION SURGERY Bilateral 01/06/2015   Procedure: BILATERAL BREAST REDUCTION, RIGHT ONCOPLASTIC RECONSTRUCTION),LEFT BREAST REDUCTION FOR SYMMETRY;  Surgeon:  [name], MD;  Location: [facility];  Service: Plastics;  Laterality: Bilateral;   BREATH TEK H PYLORI N/A 09/15/2012   Procedure: BREATH TEK H PYLORI;  Surgeon: Pedro Earls, MD;  Location: [facility];  Service: General;  Laterality: N/A;   BUNIONECTOMY Left 12/2013   CARPAL TUNNEL RELEASE Right    CARPAL TUNNEL RELEASE Left    CATARACT EXTRACTION     6/19   COLONOSCOPY     DUPUYTREN CONTRACTURE RELEASE     GASTRIC ROUX-EN-Y N/A 11/02/2012   Procedure: LAPAROSCOPIC ROUX-EN-Y GASTRIC;  Surgeon: Pedro Earls, MD;  Location: [facility];  Service: General;  Laterality: N/A;   IR GENERIC HISTORICAL  03/18/2016   IR US GUIDE VASC ACCESS LEFT 03/18/2016 Markus Daft, MD [facility]   IR GENERIC HISTORICAL  03/18/2016   IR FLUORO GUIDE CV LINE LEFT 03/18/2016 Markus Daft, MD [facility]   LAPAROSCOPIC APPENDECTOMY N/A 07/22/2015   Procedure: APPENDECTOMY LAPAROSCOPIC;  Surgeon: Michael Boston, MD;  Location: [facility];  Service: General;  Laterality: N/A;   MASTECTOMY Right 02/15/2015   modified   MODIFIED MASTECTOMY Right 02/15/2015   Procedure: RIGHT MODIFIED RADICAL MASTECTOMY;  Surgeon: Stark Klein, MD;  Location: [facility];  Service: General;  Laterality: Right;   ORIF WRIST FRACTURE Left 05/11/2019   Procedure: OPEN REDUCTION INTERNAL FIXATION (ORIF) left distal radius fracture;  Surgeon: Renette Butters, MD;  Location: [facility];  Service: Orthopedics;  Laterality: Left;  with block   PORT-A-CATH REMOVAL Left 10/10/2015   Procedure: PORT REMOVAL;  Surgeon: Stark Klein, MD;  Location: [facility];  Service: General;  Laterality: Left;   PORTACATH PLACEMENT Left 08/02/2014   Procedure: INSERTION PORT-A-CATH;  Surgeon: Stark Klein, MD;  Location: [facility];  Service: General;  Laterality: Left;   PORTACATH PLACEMENT N/A 11/05/2016   Procedure: INSERTION PORT-A-CATH;  Surgeon: Stark Klein, MD;  Location: [facility];  Service: General;  Laterality: N/A;   RETINAL LASER PROCEDURE     retina  tear on left eye   ROTATOR CUFF REPAIR Right 2005   SCAR REVISION Right 12/08/2015   Procedure: COMPLEX REPAIR RIGHT CHEST 10-15CM;  Surgeon: [name], MD;  Location: [facility];  Service: Plastics;  Laterality: Right;   TOE SURGERY  1970s   bone spur   TRIGGER FINGER RELEASE     x3    Social History:  reports that she has never smoked. She has never used smokeless tobacco. She reports current alcohol use of about 1.0 - 2.0 standard drink per week. She reports that she does not use drugs.  Allergies: Allergies  Allergen Reactions   Nsaids Other (See Comments)    H/o gastric bypass - avoid NSAIDs   Tape Hives, Rash and Other (See Comments)    Adhesives in bandaids    Family History:  Family History  Problem  Relation Age of Onset   CVA Mother    Heart disease Mother    Sudden death Mother    Diabetes Father    Heart failure Father    Hypertension Father    Kidney disease Father    Obesity Father    Hypertension Sister    Diabetes Brother    Breast cancer Paternal Grandmother 17   Diabetes Paternal Uncle    Ovarian cancer Neg Hx      Current Outpatient Medications:    Accu-Chek Softclix Lancets lancets, 1 each by Other route 4 (four) times daily. Test blood sugars 4 x times daily, Disp: 200 each, Rfl: 1   aspirin EC 81 MG tablet, Take 1 tablet (81 mg total) by mouth daily., Disp: 90 tablet, Rfl: 3   Blood Glucose Calibration (ACCU-CHEK GUIDE CONTROL) LIQD, 1 Bottle by In Vitro route as needed., Disp: 1 each, Rfl: 0   Blood Glucose Monitoring Suppl (ACCU-CHEK GUIDE) w/Device KIT, 1 each by Does not apply route 4 (four) times daily. Use to test blood sugars 4x a day, Disp: 1 kit, Rfl: 0   buPROPion (WELLBUTRIN SR) 200 MG 12 hr tablet, Take one tablet by mouth once daily., Disp: 90 tablet, Rfl: 0   CALCIUM CITRATE-VITAMIN D PO, Take 1 tablet by mouth 3 (three) times daily. , Disp: , Rfl:    carvedilol (COREG) 6.25 MG tablet, Take 1 tablet (6.25 mg  total) by mouth 2 (two) times daily., Disp: 180 tablet, Rfl: 3   cholecalciferol (VITAMIN D) 1000 units tablet, Take 1,000 Units by mouth daily., Disp: , Rfl:    cholestyramine (QUESTRAN) 4 g packet, Take 1 packet (4 g total) by mouth 2 (two) times daily as needed., Disp: 10 each, Rfl: 12   Cyanocobalamin (VITAMIN B-12 PO), Take 1 tablet by mouth every 14 (fourteen) days., Disp: , Rfl:    dexamethasone (DECADRON) 4 MG tablet, Take 2 tablets (8 mg total) by mouth daily. Start the day after chemotherapy for 2 days., Disp: 30 tablet, Rfl: 1   diphenhydrAMINE (BENADRYL) 25 MG tablet, Take 25 mg by mouth daily as needed for allergies or sleep. , Disp: , Rfl:    glucose blood (ACCU-CHEK GUIDE) test strip, Use Accu Chek test strips to check blood sugar 4 times daily. DX:E11.21, Disp: 400 each, Rfl: 0   insulin glargine (LANTUS SOLOSTAR) 100 UNIT/ML Solostar Pen, Inject 8 Units into the skin daily., Disp: 7 mL, Rfl: 3   lidocaine-prilocaine (EMLA) cream, Apply to affected area once, Disp: 30 g, Rfl: 3   loratadine (CLARITIN) 10 MG tablet, Take 10 mg by mouth as needed for allergies., Disp: , Rfl:    melatonin 5 MG TABS, Take 5 mg by mouth at bedtime., Disp: , Rfl:    NEXLIZET 180-10 MG TABS, TAKE 1 TABLET BY MOUTH DAILY., Disp: 30 tablet, Rfl: 11   omeprazole (PRILOSEC) 40 MG capsule, Take 1 capsule (40 mg total) by mouth daily., Disp: 30 capsule, Rfl: 1   ondansetron (ZOFRAN) 4 MG tablet, Take 1 tablet (4 mg total) by mouth every 8 (eight) hours as needed for nausea or vomiting., Disp: 20 tablet, Rfl: 0   Pediatric Multivitamins-Iron (FLINTSTONES PLUS IRON) chewable tablet, Chew 1 tablet by mouth 2 (two) times daily., Disp: , Rfl:    Probiotic Product ([employer] PROBIOTIC PO), Take 1 capsule by mouth daily. , Disp: , Rfl:    prochlorperazine (COMPAZINE) 10 MG tablet, Take 1 tablet (10 mg total) by mouth every 6 (six) hours  as needed (Nausea or vomiting)., Disp: 30 tablet, Rfl: 1   Propylene Glycol (SYSTANE  BALANCE OP), Place 1-2 drops into both eyes 3 (three) times daily as needed (for dry eyes). , Disp: , Rfl:    senna-docusate (SENOKOT-S) 8.6-50 MG tablet, Take 1 tablet by mouth daily as needed for moderate constipation. , Disp: , Rfl:    SURE COMFORT PEN NEEDLES 32G X 4 MM MISC, Use as instructed to inject insulin and Victoza daily., Disp: 200 each, Rfl: 0   tobramycin (TOBREX) 0.3 % ophthalmic ointment, Place 1 application into both eyes as needed (Takes day before eye injections)., Disp: , Rfl:    vortioxetine HBr (TRINTELLIX) 20 MG TABS tablet, Take 1 tablet (20 mg total) by mouth at bedtime., Disp: 90 tablet, Rfl: 0  Review of Systems:  Constitutional: Denies fever, chills, diaphoresis, appetite change and fatigue.  HEENT: Denies photophobia, eye pain, redness, hearing loss, ear pain, congestion, sore throat, rhinorrhea, sneezing, mouth sores, trouble swallowing, neck pain, neck stiffness and tinnitus.   Respiratory: Denies SOB, DOE, cough, chest tightness,  and wheezing.   Cardiovascular: Denies chest pain, palpitations and leg swelling.  Gastrointestinal: Denies nausea, vomiting, abdominal pain, diarrhea, constipation, blood in stool and abdominal distention.  Genitourinary: Denies dysuria, urgency, frequency, hematuria, flank pain and difficulty urinating.  Endocrine: Denies: hot or cold intolerance, sweats, changes in hair or nails, polyuria, polydipsia. Musculoskeletal: Denies myalgias, back pain, joint swelling, arthralgias and gait problem.  Skin: Denies pallor, rash and wound.  Neurological: Denies dizziness, seizures, syncope, weakness, light-headedness, numbness and headaches.  Hematological: Denies adenopathy. Easy bruising, personal or family bleeding history  Psychiatric/Behavioral: Denies suicidal ideation, mood changes, confusion, nervousness, sleep disturbance and agitation    Physical Exam: Vitals:   11/23/20 0742  BP: 110/68  Pulse: 64  Temp: 97.8 F (36.6 C)   TempSrc: Oral  SpO2: 97%  Weight: 165 lb 4.8 oz (75 kg)  Height: _0  (1.753 m)    Body mass index is 24.41 kg/m.   Constitutional: NAD, calm, comfortable Eyes: PERRL, lids and conjunctivae normal, wears corrective lenses ENMT: Mucous membranes are moist. Posterior pharynx clear of any exudate or lesions. Normal dentition. Tympanic membrane is pearly white, no erythema or bulging. Neck: normal, supple, no masses, no thyromegaly Respiratory: clear to auscultation bilaterally, no wheezing, no crackles. Normal respiratory effort. No accessory muscle use.  Cardiovascular: Regular rate and rhythm, no murmurs / rubs / gallops. No extremity edema. 2+ pedal pulses. No carotid bruits.  Abdomen: no tenderness, no masses palpated. No hepatosplenomegaly. Bowel sounds positive.  Musculoskeletal: no clubbing / cyanosis. No joint deformity upper and lower extremities. Good ROM, no contractures. Normal muscle tone.  Skin: no rashes, lesions, ulcers. No induration Neurologic: CN 2-12 grossly intact. Sensation intact, DTR normal. Strength 5/5 in all 4.  Psychiatric: Normal judgment and insight. Alert and oriented x 3. Normal mood.    Impression and Plan:  Encounter for preventive health examination  CKD (chronic kidney disease) stage 4, GFR 15-29 ml/min (HCC)  Essential hypertension  Diabetic retinopathy of both eyes with macular edema associated with type 2 diabetes mellitus, unspecified retinopathy severity (HCC)  OSA on CPAP  Malignant neoplasm of upper-outer quadrant of right female breast, unspecified estrogen receptor status ([place])  Vitamin D deficiency  Type 2 diabetes mellitus with both eyes affected by retinopathy and macular edema, without long-term current use of insulin, unspecified retinopathy severity ([place])  -Recommend routine eye and dental care. -Immunizations: Immunizations are up-to-date with the exception  of shingles which I have advised her to discuss with her oncology  team. -Healthy lifestyle discussed in detail. -Labs to be updated today. -Colon cancer screening: November 2015 -Breast cancer screening: October 2022 -Cervical cancer screening: July 2020 -Lung cancer screening: Not applicable -Prostate cancer screening: Not applicable -DEXA: August 2022    Patient Instructions  -Nice seeing you today!!  -Ask your oncologist about your shingles vaccine.  -Schedule follow up in 6 months.     Lelon Frohlich, MD [facility]

## 2020-11-23 NOTE — Patient Instructions (Addendum)
-  Nice seeing you today!!  -Ask your oncologist about your shingles vaccine.  -Schedule follow up in 6 months.

## 2020-11-23 NOTE — Addendum Note (Signed)
Addended by: Westley Hummer B on: 11/23/2020 08:19 AM   Modules accepted: Orders

## 2020-11-24 ENCOUNTER — Other Ambulatory Visit: Payer: Self-pay

## 2020-11-24 ENCOUNTER — Ambulatory Visit (INDEPENDENT_AMBULATORY_CARE_PROVIDER_SITE_OTHER): Payer: Medicare PPO | Admitting: Podiatrist

## 2020-11-24 DIAGNOSIS — M2011 Hallux valgus (acquired), right foot: Secondary | ICD-10-CM

## 2020-11-24 DIAGNOSIS — M21611 Bunion of right foot: Secondary | ICD-10-CM

## 2020-11-24 NOTE — Progress Notes (Signed)
Patient presents to pick up her diabetic shoes.  They are too shallow to work for her--- we chose a deeper shoe and will call when ready for pick up

## 2020-11-27 ENCOUNTER — Encounter: Payer: Self-pay | Admitting: Oncology

## 2020-11-30 ENCOUNTER — Encounter: Payer: Self-pay | Admitting: Oncology

## 2020-12-01 ENCOUNTER — Encounter: Payer: Self-pay | Admitting: Neurology

## 2020-12-01 ENCOUNTER — Other Ambulatory Visit: Payer: Self-pay

## 2020-12-01 ENCOUNTER — Ambulatory Visit: Payer: Medicare PPO | Admitting: Neurology

## 2020-12-01 VITALS — BP 106/53 | HR 73 | Ht 69.0 in | Wt 163.0 lb

## 2020-12-01 DIAGNOSIS — G5730 Lesion of lateral popliteal nerve, unspecified lower limb: Secondary | ICD-10-CM | POA: Diagnosis not present

## 2020-12-01 DIAGNOSIS — T451X5A Adverse effect of antineoplastic and immunosuppressive drugs, initial encounter: Secondary | ICD-10-CM | POA: Diagnosis not present

## 2020-12-01 DIAGNOSIS — R252 Cramp and spasm: Secondary | ICD-10-CM | POA: Diagnosis not present

## 2020-12-01 DIAGNOSIS — G620 Drug-induced polyneuropathy: Secondary | ICD-10-CM

## 2020-12-01 NOTE — Patient Instructions (Signed)
Start home exercises for your balance  If you choose to start out-patient physical therapy, please contact my office  Return to clinic in 6 months

## 2020-12-01 NOTE — Progress Notes (Signed)
Follow-up Visit   Date: 12/01/20   Darlene [name] MRN: [id number] DOB: [birth year]   Interim History: Darlene [name] is a 70 y.o. right-handed Caucasian female with insulin-dependent diabetes mellitus, gastric bypass (2015 weight 130lb weight loss), right breast cancer s/p mastectomy, chemotherapy, and radiation (2016) with liver metastasis in 2017, and hypertension  returning to the clinic for follow-up of neuropathy and bilateral foot drop.  The patient was accompanied to the clinic by self.  History of present illness: She has a long history of diabetic neuropathy since around 2010 manifesting with numbness in the feet and lower legs.  She denies painful paresthesias.  She does have imbalance and walks unassisted, although a cane has been recommended.   She is avid Hospital doctor and takes a hiking stick when she is walking.  No falls.  She lives in a one-level home.  Initially, her diabetes was poorly-controlled with HbA1c ranging in 10-12, however, after her gastric bypass in 2015, she has markedly improved diabetes management.  Her last HbA1c is 5.5 on insulin and metformin.  She lost 130lb with her surgery.  She takes daily multievitamin and B12 injection every 2 weeks. B12 level from August was normal.  Her neuropathy did get worse in 2017 when she received chemotherapy for breast cancer (carboplatin, docetaxel, trastuzumab and pertuzumab given every 3 weeks 6. Trastuzumab + capecitabine). No history of heavy alcohol use.     She developed left foot drop in 2019 which improved with physical therapy.  Further testing was not done.  She underwent right bunionectomy on 12/09/2018.  She was immobilized in a boot for several weeks and did not notice any weakness.  Starting in January 2021, she was cleared to start to wear sneakers and she felt that both feet were weak.  She went to physical therapy and noticed that she has bilateral foot drop, with the left foot asymmetrically  weaker.   NCS/EMG of the legs which showed severe sensorimotor polyneuropathy with superimposed bilateral peroneal mononeuropathy at the fibular head. She was doing PT and got a brace, which has helped stabilize her foot.  She has noticed mild improvement, such that she is able to extend the left foot, which was previously completely weak.    UPDATE 05/26/2020:  She is here for follow-up.  Overall, she feels that her foot drop is slightly better, or stable.  No worsening.  She suffered another fall in February and has been doing PT.  She continues to wear a ankle brace in the left foot and walks with a cane. Balance is fair.  At her last visit, she was having increased cramps and with her foot drop, MRI lumbar spine was ordered to evaluate for canal stenosis.  Imaging shows lateral recess stenosis at L4-5 and right disc protrusion at L3-4.    UPDATE 12/01/2020:  She is here for follow-up visit.  She has recurrence of breast cancer and is undergoing chemotherapy again. Mood is doing well. She has not noticed any worsening of her neuropathy with chemotherapy. She has not had any falls, but reports having one trip. She is very compliant with using her braces and been better with using her cane. She continues to have intermittent muscle cramps and has noticed certain foot positions tends to trigger it.     Medications:  Current Outpatient Medications on File Prior to Visit  Medication Sig Dispense Refill   Accu-Chek Softclix Lancets lancets 1 each by Other route 4 (four) times daily. Test  blood sugars 4 x times daily 200 each 1   aspirin EC 81 MG tablet Take 1 tablet (81 mg total) by mouth daily. 90 tablet 3   Blood Glucose Calibration (ACCU-CHEK GUIDE CONTROL) LIQD 1 Bottle by In Vitro route as needed. 1 each 0   Blood Glucose Monitoring Suppl (ACCU-CHEK GUIDE) w/Device KIT 1 each by Does not apply route 4 (four) times daily. Use to test blood sugars 4x a day 1 kit 0   buPROPion (WELLBUTRIN SR) 200 MG 12 hr  tablet Take one tablet by mouth once daily. 90 tablet 0   CALCIUM CITRATE-VITAMIN D PO Take 1 tablet by mouth 3 (three) times daily.      carvedilol (COREG) 6.25 MG tablet Take 1 tablet (6.25 mg total) by mouth 2 (two) times daily. 180 tablet 3   cholecalciferol (VITAMIN D) 1000 units tablet Take 1,000 Units by mouth daily.     cholestyramine (QUESTRAN) 4 g packet Take 1 packet (4 g total) by mouth 2 (two) times daily as needed. 10 each 12   Cyanocobalamin (VITAMIN B-12 PO) Take 1 tablet by mouth every 14 (fourteen) days.     dexamethasone (DECADRON) 4 MG tablet Take 2 tablets (8 mg total) by mouth daily. Start the day after chemotherapy for 2 days. 30 tablet 1   diphenhydrAMINE (BENADRYL) 25 MG tablet Take 25 mg by mouth daily as needed for allergies or sleep.      glucose blood (ACCU-CHEK GUIDE) test strip Use Accu Chek test strips to check blood sugar 4 times daily. DX:E11.21 400 each 0   insulin glargine (LANTUS SOLOSTAR) 100 UNIT/ML Solostar Pen Inject 8 Units into the skin daily. 7 mL 3   lidocaine-prilocaine (EMLA) cream Apply to affected area once 30 g 3   loratadine (CLARITIN) 10 MG tablet Take 10 mg by mouth as needed for allergies.     melatonin 5 MG TABS Take 5 mg by mouth at bedtime.     NEXLIZET 180-10 MG TABS TAKE 1 TABLET BY MOUTH DAILY. 30 tablet 11   omeprazole (PRILOSEC) 40 MG capsule Take 1 capsule (40 mg total) by mouth daily. 30 capsule 1   ondansetron (ZOFRAN) 4 MG tablet Take 1 tablet (4 mg total) by mouth every 8 (eight) hours as needed for nausea or vomiting. 20 tablet 0   Pediatric Multivitamins-Iron (FLINTSTONES PLUS IRON) chewable tablet Chew 1 tablet by mouth 2 (two) times daily.     Probiotic Product ([employer]S PROBIOTIC PO) Take 1 capsule by mouth daily.      prochlorperazine (COMPAZINE) 10 MG tablet Take 1 tablet (10 mg total) by mouth every 6 (six) hours as needed (Nausea or vomiting). 30 tablet 1   Propylene Glycol (SYSTANE BALANCE OP) Place 1-2 drops into both eyes 3  (three) times daily as needed (for dry eyes).      senna-docusate (SENOKOT-S) 8.6-50 MG tablet Take 1 tablet by mouth daily as needed for moderate constipation.      SURE COMFORT PEN NEEDLES 32G X 4 MM MISC Use as instructed to inject insulin and Victoza daily. 200 each 0   tobramycin (TOBREX) 0.3 % ophthalmic ointment Place 1 application into both eyes as needed (Takes day before eye injections).     vortioxetine HBr (TRINTELLIX) 20 MG TABS tablet Take 1 tablet (20 mg total) by mouth at bedtime. 90 tablet 0   No current facility-administered medications on file prior to visit.    Allergies:  Allergies  Allergen Reactions   Nsaids  Other (See Comments)    H/o gastric bypass - avoid NSAIDs   Tape Hives, Rash and Other (See Comments)    Adhesives in bandaids    Vital Signs:  BP (!) 106/53   Pulse 73   Ht '5\' 9"'  (1.753 m)   Wt 163 lb (73.9 kg)   LMP 10/22/2007   SpO2 97%   BMI 24.07 kg/m     Neurological Exam: MENTAL STATUS including orientation to time, place, person, recent and remote memory, attention span and concentration, language, and fund of knowledge is normal.  Speech is not dysarthric.  CRANIAL NERVES: Pupils are round and reactive to light.  Normal conjugate, extra-ocular eye movements in all directions of gaze.  No ptosis.  Face is symmetric.   MOTOR:  Mild bilateral TA atrophy, no fasciculations or abnormal movements.  No pronator drift.   Upper Extremity:  Right  Left  Deltoid  5/5   5/5   Biceps  5/5   5/5   Triceps  5/5   5/5   Infraspinatus 5/5  5/5  Medial pectoralis 5/5  5/5  Wrist extensors  5/5   5/5   Wrist flexors  5/5   5/5   Finger extensors  5/5   5/5   Finger flexors  5/5   5/5   Dorsal interossei  5/5   5/5   Abductor pollicis  5/5   5/5   Tone (Ashworth scale)  0  0   Lower Extremity:  Right  Left  Hip flexors  5/5   5/5   Hip extensors  5/5   5/5   Adductor 5/5  5/5  Abductor 5/5  5/5  Knee flexors  5/5   5/5   Knee extensors  5/5    5/5   Dorsiflexors  4+/5   3/5   Plantarflexors  5/5   5/5   Toe extensors  5/5   5/5   Toe flexors  5/5   5/5   Tone (Ashworth scale)  0  0    COORDINATION/GAIT:  Stable gait, assisted with cane and bilateral ankle brace   Data: NCS/EMG of the legs 03/03/19: The electrophysiologic findings are consistent with a chronic, severe, sensorimotor axonal polyneuropathy affecting the lower extremities. There is evidence of a superimposed common peroneal neuropathy at the fibular neck affecting bilateral lower extremities, findings are severe in degree electrically and worse on the left.  MRI brain wwo contrast 04/27/2019: No evidence of regional metastatic disease. Moderate chronic small-vessel changes of the pons and cerebral hemispheric white matter.  MRI lumbar spine 02/21/2020: 1. Advanced degenerative disc disease at L4-5 with resultant moderate left greater than right lateral recess stenosis, with moderate left and mild right L4 foraminal narrowing. 2. Small right foraminal to extraforaminal disc protrusion at L3-4, closely approximating and potentially irritating the exiting right L3 nerve root. 3. No evidence for metastatic disease within the lumbar spine.  IMPRESSION/PLAN: 1.  Muscle cramps secondary to lumbar spondylosis and metabolic changes (CKD).  Stable  - Managing with stretching  2.  Sensorimotor polyneuropathy due to diabetes and chemotherapy manifesting with numbness, stable  3.   Bilateral foot drop due to peroneal mononeuropathy at the fibular head, stable, worse on the left (2019)  - Continue to use left AFO  - Encouraged to start home exercises - She was previously at [facility] and we can refer her back, if she prefers out-pt PT   Return to clinic in 6 months  Total time spent reviewing  records, interview, history/exam, documentation, counseling, and coordination of care on day of encounter:  20 min    Thank you for allowing me to participate in patient's care.   If I can answer any additional questions, I would be pleased to do so.    Sincerely,    Saniyya Gau K. Posey Pronto, DO

## 2020-12-04 ENCOUNTER — Other Ambulatory Visit: Payer: Self-pay

## 2020-12-04 ENCOUNTER — Encounter: Payer: Medicare PPO | Attending: Family Medicine | Admitting: Dietician

## 2020-12-04 DIAGNOSIS — E119 Type 2 diabetes mellitus without complications: Secondary | ICD-10-CM | POA: Diagnosis not present

## 2020-12-04 DIAGNOSIS — N184 Chronic kidney disease, stage 4 (severe): Secondary | ICD-10-CM | POA: Diagnosis not present

## 2020-12-04 DIAGNOSIS — Z794 Long term (current) use of insulin: Secondary | ICD-10-CM | POA: Diagnosis not present

## 2020-12-04 NOTE — Patient Instructions (Addendum)
Consider asking your doctor for a prescription for a FreeStyle Libre 2.  (This should be sent through a [place] can use an app on your phone.  Have an Ensure if you are unable to eat (Glucerna or Boost Glucose Control might be better choices for your blood glucose).    Patient Instructions  Consider ways to increase your vegetable and fruit intake.             1 fruit per meal (or have this at a snack if you miss)             1 vegetable at lunch and dinner Carbohydrate (starch choices at each meal)             2 choices per meal  Increase your protein to 75 grams per day.             1 ounce protein at breakfast             3 ounces for lunch and dinner   Incorporate simple exercises into your day. Only when you feel well.

## 2020-12-04 NOTE — Progress Notes (Signed)
Diabetes Self-Management Education  Visit Type: Follow-up  Appt. Start Time: 0945 Appt. End Time: 4098  12/10/2020  Darlene [name], identified by name and date of birth, is a 70 y.o. female with a diagnosis of Diabetes:  .   ASSESSMENT Patient is here today alone.  She was last seen by this RD on 10/09/2020. She continues to be followed by Dr. Leafy Ro at [facility].  Patient states that her weight should be a little more based on her history and is currently stable at 163 lbs. She continues to struggle as her husband does not make food choices that are best for her health.  Tries to follow a low sodium diet.  She is undergoing chemo for recurrent breast cancer and is losing her hair. She may require a blood transfusion at the endo of the week. Steroids 11/23/2020. Nutrition Focused Nutrition Exam completed which is fairly consistent. 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 Medications include:  Farxiga, Lantus 12 units q am on the day of chemo and 8 units generally, Victoza, calcium, vitamin D, flinstones MVI bid, calcium with D A1C 6.3% 11/23/2020, 5.2% 12/2019, 5.3% 07/04/2020 07/05/20 eGFR 24, BUN 46, Creatine 2.2, potassium 4.5, phosphorous 3.4 07/04/2020 10/05/2020:  eGFR 22, BUN 45, Creatinine 2.38, Potassium 4.4 11/16/20 eGFR 27, BUN 33, Creatine 1.97, 4.3 12/07/2020 eGFR 33, BUN 31, Creatine 1.69, Potassium 43  Weight hx: 163 lbs 12/04/2020 163.8 lbs 10/09/2020 171 lbs 07/31/2020 172 lbs 06/13/2020 Current goal:  Weight maintenance. Highest weight 335 lbs She is at her goal weight and has been advised not to lose further.   Body Composition Scale Date 10/09/20  Current Body Weight 163.8 lbs  Total Body Fat % 33.2  Visceral Fat 7  Fat-Free Mass % 66.7   Total Body Water % 47.8  Muscle-Mass lbs 30.5  BMI 23.9   Body Fat Displacement           Torso  lbs 33.6         Left Leg  lbs 6.7         Right Leg  lbs 6.7         Left Arm  lbs 3.3         Right Arm   3.3   Body Composition scale 06/13/2020: 33.8% body fat 47.5% water, 25.2 BMI   Nutrition goals: 1800 calories 75 grams protein (increased) 2 grams sodium   Patient lives with her husband who helps with the shopping and cooking. She is retired.  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 Physical activity with PT this year.  Lacks motivation for exercise.  Currently not exercising.   Diabetes Self-Management Education - 12/10/20 1400       Visit Information   Visit Type Follow-up      Initial Visit   Are you taking your medications as prescribed? Yes      Psychosocial Assessment   Other persons present Patient    Patient Concerns Nutrition/Meal planning      Pre-Education Assessment   Patient understands the diabetes disease and treatment process. Demonstrates understanding / competency    Patient understands incorporating nutritional management into lifestyle. Needs Review    Patient undertands incorporating physical activity into lifestyle. Demonstrates understanding / competency    Patient understands using medications safely. Demonstrates understanding / competency  Patient understands monitoring blood glucose, interpreting and using results Demonstrates understanding / competency    Patient understands prevention, detection, and treatment of acute complications. Demonstrates understanding / competency    Patient understands prevention, detection, and treatment of chronic complications. Demonstrates understanding / competency    Patient understands how to develop strategies to address psychosocial issues. Demonstrates understanding / competency    Patient understands how to develop strategies to promote health/change behavior. Needs Review      Complications   Last HgB  A1C per patient/outside source 6.3 %   11/23/2020   How often do you check your blood sugar? 1-2 times/day    Fasting Blood glucose range (mg/dL) 70-129    Number of hypoglycemic episodes per month 0    Number of hyperglycemic episodes per week 0      Dietary Intake   Breakfast honey nut cheerios and mioi OR Homemade toast with PB or egg and fruit    Lunch sandwich or leftovers    Snack (afternoon) rice cakes    Dinner [place] [employer], 1/2 beans,    Snack (evening) fruit, ice cream sandwich    Beverage(s) water, black coffee, diet Mt. Dew      Exercise   Exercise Type ADL's      Patient Education   Nutrition management  Meal options for control of blood glucose level and chronic complications.;Other (comment)   macronutrient goals based on cancer treatment and CKD   Medications Reviewed patients medication for diabetes, action, purpose, timing of dose and side effects.    Psychosocial adjustment Worked with patient to identify barriers to care and solutions      Patient Self-Evaluation of Goals - Patient rates self as meeting previously set goals (% of time)   Nutrition >75%    Physical Activity >75%    Medications >75%    Monitoring >75%    Problem Solving >75%    Reducing Risk >75%    Health Coping >75%      [name]-Education Assessment   Patient understands the diabetes disease and treatment process. Demonstrates understanding / competency    Patient understands incorporating nutritional management into lifestyle. Needs Review    Patient undertands incorporating physical activity into lifestyle. Demonstrates understanding / competency    Patient understands using medications safely. Demonstrates understanding / competency    Patient understands monitoring blood glucose, interpreting and using results Demonstrates understanding / competency    Patient understands prevention, detection, and treatment of acute complications. Demonstrates understanding / competency    Patient  understands prevention, detection, and treatment of chronic complications. Demonstrates understanding / competency    Patient understands how to develop strategies to address psychosocial issues. Demonstrates understanding / competency    Patient understands how to develop strategies to promote health/change behavior. Needs Review      Outcomes   Expected Outcomes Demonstrated interest in learning. Expect positive outcomes    Future DMSE PRN    Program Status Completed      Subsequent Visit   Since your last visit have you continued or begun to take your medications as prescribed? Yes    Since your last visit have you experienced any weight changes? No change             Individualized Plan for Diabetes Self-Management Training:   Learning Objective:  Patient will have a greater understanding of diabetes self-management. Patient education plan is to attend individual and/or group sessions per assessed needs and concerns.   Plan:   Patient  Instructions  Consider asking your doctor for a prescription for a FreeStyle Libre 2.  (This should be sent through a [place] can use an app on your phone.  Have an Ensure if you are unable to eat (Glucerna or Boost Glucose Control might be better choices for your blood glucose).    Patient Instructions  Consider ways to increase your vegetable and fruit intake.             1 fruit per meal (or have this at a snack if you miss)             1 vegetable at lunch and dinner Carbohydrate (starch choices at each meal)             2 choices per meal  Increase your protein to 75 grams per day.             1 ounce protein at breakfast             3 ounces for lunch and dinner   Incorporate simple exercises into your day. Only when you feel well.  Expected Outcomes:  Demonstrated interest in learning. Expect positive outcomes  Education material provided:   If problems or questions, patient to contact team via:  Phone  Future  DSME appointment: PRN

## 2020-12-05 ENCOUNTER — Ambulatory Visit (INDEPENDENT_AMBULATORY_CARE_PROVIDER_SITE_OTHER): Payer: Medicare PPO | Admitting: Family Medicine

## 2020-12-05 ENCOUNTER — Other Ambulatory Visit: Payer: Self-pay

## 2020-12-05 ENCOUNTER — Encounter (INDEPENDENT_AMBULATORY_CARE_PROVIDER_SITE_OTHER): Payer: Self-pay | Admitting: Family Medicine

## 2020-12-05 ENCOUNTER — Ambulatory Visit (INDEPENDENT_AMBULATORY_CARE_PROVIDER_SITE_OTHER): Payer: Medicare PPO | Admitting: Podiatrist

## 2020-12-05 VITALS — BP 121/62 | HR 79 | Temp 97.9°F | Ht 69.0 in | Wt 161.0 lb

## 2020-12-05 DIAGNOSIS — Z6833 Body mass index (BMI) 33.0-33.9, adult: Secondary | ICD-10-CM

## 2020-12-05 DIAGNOSIS — E1140 Type 2 diabetes mellitus with diabetic neuropathy, unspecified: Secondary | ICD-10-CM | POA: Diagnosis not present

## 2020-12-05 DIAGNOSIS — M2041 Other hammer toe(s) (acquired), right foot: Secondary | ICD-10-CM

## 2020-12-05 DIAGNOSIS — Z794 Long term (current) use of insulin: Secondary | ICD-10-CM

## 2020-12-05 DIAGNOSIS — F329 Major depressive disorder, single episode, unspecified: Secondary | ICD-10-CM | POA: Diagnosis not present

## 2020-12-05 DIAGNOSIS — E1142 Type 2 diabetes mellitus with diabetic polyneuropathy: Secondary | ICD-10-CM

## 2020-12-05 DIAGNOSIS — M19071 Primary osteoarthritis, right ankle and foot: Secondary | ICD-10-CM | POA: Diagnosis not present

## 2020-12-05 DIAGNOSIS — E669 Obesity, unspecified: Secondary | ICD-10-CM

## 2020-12-05 MED ORDER — VORTIOXETINE HBR 20 MG PO TABS: 20.0000 mg | ORAL_TABLET | Freq: Every day | ORAL | 0 refills | Status: DC

## 2020-12-05 MED ORDER — BUPROPION HCL ER (SR) 200 MG PO TB12: ORAL_TABLET | ORAL | 0 refills | Status: DC

## 2020-12-05 NOTE — Progress Notes (Signed)
   The patient presented to the office to pick up extra depth diabetic shoes - the size 9.5 Xwide were noted to fit her best.     The shoes fit well with no heel slippage or areas of pressure concern.   Instructions for break in and wear were dispensed .   The  delivery documentation and break in instruction forms were signed and a copy of the paperwork was given to the patient.    Patient advised to contact us if any problems arise.  Patient also advised on how to report any issues

## 2020-12-05 NOTE — Progress Notes (Signed)
Chief Complaint:   OBESITY Darlene [name] is here to discuss her progress with her obesity treatment plan along with follow-up of her obesity related diagnoses. Darlene [name] is on keeping a food journal and adhering to recommended goals of 1800 calories and 60-75+ grams of protein daily and states she is following her eating plan approximately 80% of the time. Darlene [name] states she is doing 0 minutes 0 times per week.  Today's visit was #: 67 Starting weight: 225 lbs Starting date: 08/09/2016 Today's weight: 161 lbs Today's date: 12/05/2020 Total lbs lost to date: 25 Total lbs lost since last in-office visit: 2  Interim History: Darlene [name]'s goal is to maintain her weight. She is doing chemo every 3 weeks. She has some significant nausea and sometimes vomiting and continues to lose weight, which is no longer her goal.  Subjective:   1. Type 2 diabetes mellitus with diabetic neuropathy, with long-term current use of insulin (HCC) Darlene [name] is now seeing Dr. Chalmers Cater and she is doing well. She is now on SSI and her recent A1c is 6.3. She has no signs of hypoglycemia.  2. Major depressive disorder with current active episode, unspecified depression episode severity, unspecified whether recurrent Darlene [name] is stable on her medications, and she is dealing with stress reasonably well.  Assessment/Plan:   1. Type 2 diabetes mellitus with diabetic neuropathy, with long-term current use of insulin ([place]) We discussed options for CGM and possible increased availability for insurance coverage after January. Darlene [name] will continue with diet and exercise and will continue to follow up as directed. Good blood sugar control is important to decrease the likelihood of diabetic complications such as nephropathy, neuropathy, limb loss, blindness, coronary artery disease, and death. Intensive lifestyle modification including diet, exercise and weight loss are the first line of treatment for diabetes.   2. Major  depressive disorder with current active episode, unspecified depression episode severity, unspecified whether recurrent Darlene [name] will continue her medications, and we will refill Wellbutrin SR and Trintellix for 90 days with no refills. Orders and follow up as documented in patient record.   - buPROPion (WELLBUTRIN SR) 200 MG 12 hr tablet; Take one tablet by mouth once daily.  Dispense: 90 tablet; Refill: 0 - vortioxetine HBr (TRINTELLIX) 20 MG TABS tablet; Take 1 tablet (20 mg total) by mouth at bedtime.  Dispense: 90 tablet; Refill: 0  3. Obesity with current BMI 23.8 Darlene [name] is currently in the action stage of change. As such, her goal is to maintain weight for now. She has agreed to keeping a food journal and adhering to recommended goals of 1800 calories and 85+ grams of protein daily.   Darlene [name] was encouraged to discuss the nausea and vomiting with her Oncologist. She is to work on high calorie foods like nuts, avocados, and olive oil to increase her calories without increasing sugars.  Behavioral modification strategies: holiday eating strategies .  Darlene [name] has agreed to follow-up with our clinic in 3 months. She was informed of the importance of frequent follow-up visits to maximize her success with intensive lifestyle modifications for her multiple health conditions.   Objective:   Blood pressure 121/62, pulse 79, temperature 97.9 F (36.6 C), height 5\' 9"  (1.753 m), weight 161 lb (73 kg), last menstrual period 10/22/2007, SpO2 100 %. Body mass index is 23.78 kg/m.  General: Cooperative, alert, well developed, in no acute distress. HEENT: Conjunctivae and lids unremarkable. Cardiovascular: Regular rhythm.  Lungs: Normal work of breathing. Neurologic: No focal deficits.   Lab Results  Component Value Date   CREATININE 1.97 (H) 11/16/2020   BUN 33 (H) 11/16/2020   NA 142 11/16/2020   K 4.2 11/16/2020   CL 111 11/16/2020   CO2 22 11/16/2020   Lab Results  Component  Value Date   ALT 10 11/16/2020   AST 18 11/16/2020   ALKPHOS 50 11/16/2020   BILITOT 0.5 11/16/2020   Lab Results  Component Value Date   HGBA1C 6.3 (A) 11/23/2020   HGBA1C 5.3 07/04/2020   HGBA1C 5.2 01/04/2020   HGBA1C 5.4 10/05/2019   HGBA1C 5.6 05/11/2019   Lab Results  Component Value Date   INSULIN 7.6 01/25/2019   INSULIN 15.3 03/11/2017   INSULIN 18.3 08/09/2016   Lab Results  Component Value Date   TSH 1.54 02/07/2017   Lab Results  Component Value Date   CHOL 94 10/10/2020   HDL 55 10/10/2020   LDLCALC 26 10/10/2020   TRIG 63 10/10/2020   CHOLHDL 1.7 10/10/2020   Lab Results  Component Value Date   VD25OH 49.3 01/04/2020   VD25OH 43.6 10/05/2019   VD25OH 43.5 08/27/2018   Lab Results  Component Value Date   WBC 3.4 (L) 11/16/2020   HGB 8.8 (L) 11/16/2020   HCT 25.8 (L) 11/16/2020   MCV 97.0 11/16/2020   PLT 280 11/16/2020   Lab Results  Component Value Date   IRON 58 02/18/2013   TIBC 305 02/18/2013   FERRITIN 133 10/31/2014   Attestation Statements:   Reviewed by clinician on day of visit: allergies, medications, problem list, medical history, surgical history, family history, social history, and previous encounter notes.  Time spent on visit including pre-visit chart review and post-visit care and charting was 43 minutes.    I, [name], am acting as transcriptionist for Dennard Nip, MD.  I have reviewed the above documentation for accuracy and completeness, and I agree with the above. -  Dennard Nip, MD

## 2020-12-07 ENCOUNTER — Inpatient Hospital Stay: Payer: Medicare PPO | Attending: Oncology | Admitting: Pharmacist

## 2020-12-07 ENCOUNTER — Encounter: Payer: Self-pay | Admitting: Oncology

## 2020-12-07 ENCOUNTER — Other Ambulatory Visit: Payer: Medicare PPO

## 2020-12-07 ENCOUNTER — Inpatient Hospital Stay: Payer: Medicare PPO

## 2020-12-07 ENCOUNTER — Other Ambulatory Visit: Payer: Self-pay

## 2020-12-07 VITALS — BP 109/56 | HR 63 | Temp 98.1°F | Resp 16 | Ht 69.0 in | Wt 166.5 lb

## 2020-12-07 VITALS — BP 120/80 | HR 60 | Temp 98.5°F | Resp 14 | Wt 167.0 lb

## 2020-12-07 DIAGNOSIS — C50811 Malignant neoplasm of overlapping sites of right female breast: Secondary | ICD-10-CM | POA: Diagnosis not present

## 2020-12-07 DIAGNOSIS — Z95828 Presence of other vascular implants and grafts: Secondary | ICD-10-CM

## 2020-12-07 DIAGNOSIS — Z5112 Encounter for antineoplastic immunotherapy: Secondary | ICD-10-CM | POA: Insufficient documentation

## 2020-12-07 DIAGNOSIS — C50411 Malignant neoplasm of upper-outer quadrant of right female breast: Secondary | ICD-10-CM

## 2020-12-07 DIAGNOSIS — Z7189 Other specified counseling: Secondary | ICD-10-CM

## 2020-12-07 LAB — CMP (CANCER CENTER ONLY)
ALT: 10 U/L (ref 0–44)
AST: 20 U/L (ref 15–41)
Albumin: 3 g/dL — ABNORMAL LOW (ref 3.5–5.0)
Alkaline Phosphatase: 58 U/L (ref 38–126)
Anion gap: 7 (ref 5–15)
BUN: 31 mg/dL — ABNORMAL HIGH (ref 8–23)
CO2: 27 mmol/L (ref 22–32)
Calcium: 8.5 mg/dL — ABNORMAL LOW (ref 8.9–10.3)
Chloride: 107 mmol/L (ref 98–111)
Creatinine: 1.69 mg/dL — ABNORMAL HIGH (ref 0.44–1.00)
GFR, Estimated: 33 mL/min — ABNORMAL LOW (ref >60)
Glucose, Bld: 210 mg/dL — ABNORMAL HIGH (ref 70–99)
Potassium: 4.3 mmol/L (ref 3.5–5.1)
Sodium: 141 mmol/L (ref 135–145)
Total Bilirubin: 0.4 mg/dL (ref 0.3–1.2)
Total Protein: 5.7 g/dL — ABNORMAL LOW (ref 6.5–8.1)

## 2020-12-07 LAB — CBC WITH DIFFERENTIAL (CANCER CENTER ONLY)
Abs Immature Granulocytes: 0.02 10*3/uL (ref 0.00–0.07)
Basophils Absolute: 0 10*3/uL (ref 0.0–0.1)
Basophils Relative: 1 %
Eosinophils Absolute: 0.1 10*3/uL (ref 0.0–0.5)
Eosinophils Relative: 3 %
HCT: 27.3 % — ABNORMAL LOW (ref 36.0–46.0)
Hemoglobin: 9.1 g/dL — ABNORMAL LOW (ref 12.0–15.0)
Immature Granulocytes: 1 %
Lymphocytes Relative: 19 %
Lymphs Abs: 0.7 10*3/uL (ref 0.7–4.0)
MCH: 33.2 pg (ref 26.0–34.0)
MCHC: 33.3 g/dL (ref 30.0–36.0)
MCV: 99.6 fL (ref 80.0–100.0)
Monocytes Absolute: 0.5 10*3/uL (ref 0.1–1.0)
Monocytes Relative: 13 %
Neutro Abs: 2.5 10*3/uL (ref 1.7–7.7)
Neutrophils Relative %: 63 %
Platelet Count: 239 10*3/uL (ref 150–400)
RBC: 2.74 MIL/uL — ABNORMAL LOW (ref 3.87–5.11)
RDW: 15.6 % — ABNORMAL HIGH (ref 11.5–15.5)
WBC Count: 3.9 10*3/uL — ABNORMAL LOW (ref 4.0–10.5)
nRBC: 0 % (ref 0.0–0.2)

## 2020-12-07 LAB — CEA (IN HOUSE-CHCC): CEA (CHCC-In House): 4.64 ng/mL (ref 0.00–5.00)

## 2020-12-07 MED ORDER — ONDANSETRON HCL 8 MG PO TABS: 8.0000 mg | ORAL_TABLET | Freq: Three times a day (TID) | ORAL | 2 refills | Status: DC | PRN

## 2020-12-07 MED ORDER — FAM-TRASTUZUMAB DERUXTECAN-NXKI CHEMO 100 MG IV SOLR
5.2500 mg/kg | Freq: Once | INTRAVENOUS | Status: AC
  Administered 2020-12-07: 400 mg via INTRAVENOUS
  Filled 2020-12-07: qty 20

## 2020-12-07 MED ORDER — SODIUM CHLORIDE 0.9% FLUSH
10.0000 mL | Freq: Once | INTRAVENOUS | Status: AC
  Administered 2020-12-07: 12:00:00 10 mL

## 2020-12-07 MED ORDER — DEXTROSE 5 % IV SOLN: Freq: Once | INTRAVENOUS | Status: AC

## 2020-12-07 MED ORDER — DIPHENHYDRAMINE HCL 25 MG PO CAPS
25.0000 mg | ORAL_CAPSULE | Freq: Once | ORAL | Status: AC
  Administered 2020-12-07: 14:00:00 25 mg via ORAL
  Filled 2020-12-07: qty 1

## 2020-12-07 MED ORDER — PALONOSETRON HCL INJECTION 0.25 MG/5ML
0.2500 mg | Freq: Once | INTRAVENOUS | Status: AC
  Administered 2020-12-07: 14:00:00 0.25 mg via INTRAVENOUS
  Filled 2020-12-07: qty 5

## 2020-12-07 MED ORDER — HEPARIN SOD (PORK) LOCK FLUSH 100 UNIT/ML IV SOLN
500.0000 [IU] | Freq: Once | INTRAVENOUS | Status: AC | PRN
  Administered 2020-12-07: 16:00:00 500 [IU]

## 2020-12-07 MED ORDER — SODIUM CHLORIDE 0.9 % IV SOLN
10.0000 mg | Freq: Once | INTRAVENOUS | Status: AC
  Administered 2020-12-07: 14:00:00 10 mg via INTRAVENOUS
  Filled 2020-12-07: qty 10

## 2020-12-07 MED ORDER — ACETAMINOPHEN 325 MG PO TABS
650.0000 mg | ORAL_TABLET | Freq: Once | ORAL | Status: AC
  Administered 2020-12-07: 14:00:00 650 mg via ORAL
  Filled 2020-12-07: qty 2

## 2020-12-07 MED ORDER — SODIUM CHLORIDE 0.9% FLUSH
10.0000 mL | INTRAVENOUS | Status: DC | PRN
  Administered 2020-12-07: 16:00:00 10 mL

## 2020-12-07 NOTE — Progress Notes (Signed)
Per MD may proceed with treatment for creat of 1.6.

## 2020-12-07 NOTE — Progress Notes (Signed)
[facility]       Telephone: [phone]?Fax: [phone]   Oncology Clinical Pharmacist Practitioner Progress Note  Darlene [name] was contacted via in-person visit to discuss her chemotherapy regimen for fam-trastuzumab deruxtecan-nxki (Enhertu) which they receive under the care of Dr. Lurline [name].   Current treatment regimen and start date fam-trastuzumab deruxtecan-nxki (started 10/05/20)  Interval History She continues on fam-trastuzumab deruxtecan-nxki, 410 mg (5.4 mg/kg), by IV route. fam-trastuzumab deruxtecan-nxki is given on day 1 of a 21-day cycle. Therapy is planned to continue until disease progression or unacceptable toxicity.   Response to Therapy Darlene [name] is doing well.  She last saw Dr. Jana [name] on 11/16/20 and clinical pharmacy on 10/26/20. She reports her dizziness has resolved and she remains off her losartan. She follows with Dr. Aundra [name] for blood pressure. Her diarrhea has also subsided. She will purchase some OTC loperamide if needed.  She continues to have episodes of nausea and vomiting.  These start on the 3rd day after chemotherapy after she has stopped taking her dexamethasone.  They usually are 1-2 episodes per day for 10 days and then subside until the next cycle of chemotherapy. We discussed potentially extending her dexamethasone for another day (currently 2 days after chemotherapy) but because she has diabetes and her sugars have been elevated we discussed other options such as adding ondansetron which she will start on the third day after chemotherapy. This prescription has been sent to her pharmacy of choice.  She will take the ondansetron 30 minutes before breakfast and then every 8 hours as needed.  She will take the prochlorperazine if the nausea is refractory to the ondansetron. Darlene [name] took ondansetron with her past chemotherapy and she states she tolerated it well. Reviewed that if nausea/vomiting continue after starting  ondansetron to contact the clinic for other options which may include adding olanzapine to her premedication regimen and take home medications. We also discussed some non-pharmacological options for nausea.   Darlene [name] will see Dr. Jana [name] with labs and fam-trastuzumab deruxtecan-nxki treatment on 12/28/20 and then clinical pharmacy again on 01/18/21. Labs were reviewed with Darlene [name] today and both patient and provider are in agreement to continue therapy at this time.  Allergies Allergies  Allergen Reactions   Nsaids Other (See Comments)    H/o gastric bypass - avoid NSAIDs   Tape Hives, Rash and Other (See Comments)    Adhesives in bandaids    Vitals Vitals:   12/07/20 1237  BP: (!) 109/56  Pulse: 63  Resp: 16  Temp: 98.1 F (36.7 C)  SpO2: 99%    Laboratory Data CBC EXTENDED Latest Ref Rng & Units 12/07/2020 11/16/2020 10/26/2020  WBC 4.0 - 10.5 K/uL 3.9(L) 3.4(L) 3.8(L)  RBC 3.87 - 5.11 MIL/uL 2.74(L) 2.66(L) 2.81(L)  HGB 12.0 - 15.0 g/dL 9.1(L) 8.8(L) 9.1(L)  HCT 36.0 - 46.0 % 27.3(L) 25.8(L) 26.5(L)  PLT 150 - 400 K/uL 239 280 248  NEUTROABS 1.7 - 7.7 K/uL 2.5 1.8 2.2  LYMPHSABS 0.7 - 4.0 K/uL 0.7 1.0 0.9    CMP Latest Ref Rng & Units 12/07/2020 11/16/2020 10/26/2020  Glucose 70 - 99 mg/dL 210(H) 174(H) 107(H)  BUN 8 - 23 mg/dL 31(H) 33(H) 51(H)  Creatinine 0.44 - 1.00 mg/dL 1.69(H) 1.97(H) 2.32(H)  Sodium 135 - 145 mmol/L 141 142 139  Potassium 3.5 - 5.1 mmol/L 4.3 4.2 4.6  Chloride 98 - 111 mmol/L 107 111 110  CO2 22 - 32 mmol/L 27 22 23  Calcium 8.9 - 10.3 mg/dL 8.5(L) 8.8(L) 8.7(L)  Total Protein 6.5 - 8.1 g/dL 5.7(L) 5.8(L) 5.9(L)  Total Bilirubin 0.3 - 1.2 mg/dL 0.4 0.5 0.4  Alkaline Phos 38 - 126 U/L 58 50 47  AST 15 - 41 U/L '20 18 17  ' ALT 0 - 44 U/L '10 10 9     ' Adverse Effects Assessment Nausea / Vomiting: as above, ondansetron prescription sent. Will start 30 minutes prior to breakfast and then every 8 hours as needed.  Continue  prochlorperazine as needed for refractory nausea/vomiting. Discussed non-pharmacological options for nausea/vomiting such as: eating bland, easy-to-digest foods in small amounts. These foods include bananas, applesauce, rice, lean meats, toast, and crackers.Drinking clear fluids slowly and in small amounts. Clear fluids include water, ice chips, low-calorie sports drinks, and fruit juice that has water added (diluted fruit juice).  We discussed to avoid drinking fluids that contain a lot of sugar or caffeine, such as energy drinks, sports drinks, and soda. Encouraged to avoid alcohol while nausea and vomiting are present and avoiding spicy or fatty foods. ILD / Pneumonitis: reviewed warning signs and will contact clinic immediately should these occur  Medication Reconciliation The patient's medication list was reviewed today with the patient? Yes New medications or herbal supplements have recently been started? Yes, insulin sliding scale per Dr. Chalmers [name] Any medications have been discontinued? Yes  The medication list was updated and reconciled based on the patient's most recent medication list in the electronic medical record (EMR) including herbal products and OTC medications.   Medications Current Outpatient Medications  Medication Sig Dispense Refill   Accu-Chek Softclix Lancets lancets 1 each by Other route 4 (four) times daily. Test blood sugars 4 x times daily 200 each 1   aspirin EC 81 MG tablet Take 1 tablet (81 mg total) by mouth daily. 90 tablet 3   Blood Glucose Calibration (ACCU-CHEK GUIDE CONTROL) LIQD 1 Bottle by In Vitro route as needed. 1 each 0   Blood Glucose Monitoring Suppl (ACCU-CHEK GUIDE) w/Device KIT 1 each by Does not apply route 4 (four) times daily. Use to test blood sugars 4x a day 1 kit 0   buPROPion (WELLBUTRIN SR) 200 MG 12 hr tablet Take one tablet by mouth once daily. 90 tablet 0   CALCIUM CITRATE-VITAMIN D PO Take 1 tablet by mouth 3 (three) times daily.       carvedilol (COREG) 6.25 MG tablet Take 1 tablet (6.25 mg total) by mouth 2 (two) times daily. 180 tablet 3   cholecalciferol (VITAMIN D) 1000 units tablet Take 1,000 Units by mouth daily.     Cyanocobalamin (VITAMIN B-12 PO) Take 1 tablet by mouth every 14 (fourteen) days.     dexamethasone (DECADRON) 4 MG tablet Take 2 tablets (8 mg total) by mouth daily. Start the day after chemotherapy for 2 days. 30 tablet 1   glucose blood (ACCU-CHEK GUIDE) test strip Use Accu Chek test strips to check blood sugar 4 times daily. DX:E11.21 400 each 0   insulin aspart (NOVOLOG) 100 UNIT/ML injection Inject into the skin daily as needed for high blood sugar. Per patient, sliding scale with meals as needed     insulin glargine (LANTUS SOLOSTAR) 100 UNIT/ML Solostar Pen Inject 8 Units into the skin daily. (Patient taking differently: Inject 8 Units into the skin daily. Per patient, day of chemotherapy and 2 days after, taking 12 units daily (Dr. Chalmers [name])) 7 mL 3   loratadine (CLARITIN) 10 MG tablet Take 10 mg by mouth  as needed for allergies.     melatonin 5 MG TABS Take 5 mg by mouth at bedtime.     NEXLIZET 180-10 MG TABS TAKE 1 TABLET BY MOUTH DAILY. 30 tablet 11   omeprazole (PRILOSEC) 40 MG capsule Take 1 capsule (40 mg total) by mouth daily. 30 capsule 1   ondansetron (ZOFRAN) 8 MG tablet Take 1 tablet (8 mg total) by mouth every 8 (eight) hours as needed for nausea or vomiting. Start on day 3 after chemo. 30 tablet 2   Pediatric Multivitamins-Iron (FLINTSTONES PLUS IRON) chewable tablet Chew 1 tablet by mouth 2 (two) times daily.     Probiotic Product ([employer] PROBIOTIC PO) Take 1 capsule by mouth daily.      prochlorperazine (COMPAZINE) 10 MG tablet Take 1 tablet (10 mg total) by mouth every 6 (six) hours as needed (Nausea or vomiting). 30 tablet 1   Propylene Glycol (SYSTANE BALANCE OP) Place 1-2 drops into both eyes 3 (three) times daily as needed (for dry eyes).      SURE COMFORT PEN NEEDLES 32G X 4 MM MISC Use  as instructed to inject insulin and Victoza daily. 200 each 0   tobramycin (TOBREX) 0.3 % ophthalmic ointment Place 1 application into both eyes as needed (Takes day before eye injections).     vortioxetine HBr (TRINTELLIX) 20 MG TABS tablet Take 1 tablet (20 mg total) by mouth at bedtime. 90 tablet 0   diphenhydrAMINE (BENADRYL) 25 MG tablet Take 25 mg by mouth daily as needed for allergies or sleep.  (Patient not taking: Reported on 12/07/2020)     senna-docusate (SENOKOT-S) 8.6-50 MG tablet Take 1 tablet by mouth daily as needed for moderate constipation.  (Patient not taking: Reported on 12/07/2020)     No current facility-administered medications for this visit.    Drug-Drug Interactions (DDIs) DDIs were evaluated? Yes Significant DDIs? No  The patient was instructed to speak with their health care provider and/or the oral chemotherapy pharmacist before starting any new drug, including prescription or over the counter, natural / herbal products, or vitamins.  Supportive Care Will continue drinking plenty of fluids.  Creatinine levels continue a downward trend She continues to follow with Dr. Chalmers [name] in endocrinology for her blood sugars and is now on  Continues off losartan per Dr. Morey Hummingbird with Dr. Leafy Ro for weight management/wellness  Patient Education/Re-education The patient was educated regarding the following supportive care topics:   Nausea/Vomiting Diarrhea ILD / Pneumonitis Neutropenia  Dosing Assessment Hepatic adjustments needed? No  Renal adjustments needed? No  Toxicity adjustments needed? No  The current dosing regimen is appropriate to continue at this time.  Follow-Up Plan Start ondansetron on Sunday, 30 minutes prior to breakfast and continue every 8 hours as needed for nausea/vomiting Continue prochlorperazine for refractory nausea/vomiting Eat small meals, bland-food, avoid greasy/spicy foods as  above Will contact clinic if nausea/vomiting  continues OTC loperamide as needed for diarrhea Labs / visit with Dr. Jana [name] / fam-trastuzumab deruxtecan-nxki on 12/28/20 Labs / visit with pharmacy clinic / fam-trastuzumab deruxtecan-nxki on 01/18/21  Darlene [name] participated in the discussion, expressed understanding, and voiced agreement with the above plan. All questions were answered to her satisfaction. The patient was advised to contact the clinic at [phone] with any questions or concerns prior to her return visit.   I spent 45 minutes assessing and educating the patient.  Raina Mina, RPH-CPP, 12/07/2020  1:33 PM

## 2020-12-07 NOTE — Patient Instructions (Signed)
[facility]  Discharge Instructions: Thank you for choosing [facility] to provide your oncology and hematology care.   If you have a lab appointment with the [facility], please go directly to the [facility] and check in at the registration area.   Wear comfortable clothing and clothing appropriate for easy access to any Portacath or PICC line.   We strive to give you quality time with your provider. You may need to reschedule your appointment if you arrive late (15 or more minutes).  Arriving late affects you and other patients whose appointments are after yours.  Also, if you miss three or more appointments without notifying the office, you may be dismissed from the clinic at the provider's discretion.      For prescription refill requests, have your pharmacy contact our office and allow 72 hours for refills to be completed.    Today you received the following chemotherapy and/or immunotherapy agents ENHERTU      To help prevent nausea and vomiting after your treatment, we encourage you to take your nausea medication as directed.  BELOW ARE SYMPTOMS THAT SHOULD BE REPORTED IMMEDIATELY: *FEVER GREATER THAN 100.4 F (38 C) OR HIGHER *CHILLS OR SWEATING *NAUSEA AND VOMITING THAT IS NOT CONTROLLED WITH YOUR NAUSEA MEDICATION *UNUSUAL SHORTNESS OF BREATH *UNUSUAL BRUISING OR BLEEDING *URINARY PROBLEMS (pain or burning when urinating, or frequent urination) *BOWEL PROBLEMS (unusual diarrhea, constipation, pain near the anus) TENDERNESS IN MOUTH AND THROAT WITH OR WITHOUT PRESENCE OF ULCERS (sore throat, sores in mouth, or a toothache) UNUSUAL RASH, SWELLING OR PAIN  UNUSUAL VAGINAL DISCHARGE OR ITCHING   Items with * indicate a potential emergency and should be followed up as soon as possible or go to the Emergency Department if any problems should occur.  Please show the CHEMOTHERAPY ALERT CARD or IMMUNOTHERAPY ALERT CARD at check-in to the  Emergency Department and triage nurse.  Should you have questions after your visit or need to cancel or reschedule your appointment, please contact [facility]  Dept: [phone]  and follow the prompts.  Office hours are 8:00 a.m. to 4:30 p.m. Monday - Friday. Please note that voicemails left after 4:00 p.m. may not be returned until the following business day.  We are closed weekends and major holidays. You have access to a nurse at all times for urgent questions. Please call the main number to the clinic Dept: [phone] and follow the prompts.   For any non-urgent questions, you may also contact your provider using MyChart. We now offer e-Visits for anyone 68 and older to request care online for non-urgent symptoms. For details visit [email or web address].   Also download the MyChart app! Go to the app store, search "MyChart", open the app, select [facility], and log in with your MyChart username and password.  Due to Covid, a mask is required upon entering the hospital/clinic. If you do not have a mask, one will be given to you upon arrival. For doctor visits, patients may have 1 support person aged 70 or older with them. For treatment visits, patients cannot have anyone with them due to current Covid guidelines and our immunocompromised population.

## 2020-12-08 ENCOUNTER — Inpatient Hospital Stay: Payer: Medicare PPO

## 2020-12-08 DIAGNOSIS — E1165 Type 2 diabetes mellitus with hyperglycemia: Secondary | ICD-10-CM | POA: Diagnosis not present

## 2020-12-10 ENCOUNTER — Encounter: Payer: Self-pay | Admitting: Dietician

## 2020-12-12 ENCOUNTER — Telehealth: Payer: Self-pay

## 2020-12-12 DIAGNOSIS — K5900 Constipation, unspecified: Secondary | ICD-10-CM | POA: Diagnosis not present

## 2020-12-12 NOTE — Telephone Encounter (Signed)
Caller states she is experiencing constipation with possible impacted feces. ---Caller states she has been having constipation and she used a suppository and an enema yesterday and had some stool out. She feels like there is still some stool there that is stuck. No fever  12/12/2020 2:21:56 PM See HCP within 4 Hours (or PCP triage) Ysidro Evert, RN, [place] Understands Yes  Referrals REFERRED TO PCP OFFICE  12/12/20 1603: Pt states she is at an [facility]) at this time. Pt advised to make f/u appt as needed; verb understanding.

## 2020-12-22 ENCOUNTER — Ambulatory Visit (HOSPITAL_COMMUNITY)
Admission: RE | Admit: 2020-12-22 | Discharge: 2020-12-22 | Disposition: A | Discharge status: Home / Self Care | Payer: Medicare PPO | Source: Ambulatory Visit | Attending: Oncology | Admitting: Oncology

## 2020-12-22 DIAGNOSIS — C50411 Malignant neoplasm of upper-outer quadrant of right female breast: Secondary | ICD-10-CM | POA: Diagnosis not present

## 2020-12-22 DIAGNOSIS — R932 Abnormal findings on diagnostic imaging of liver and biliary tract: Secondary | ICD-10-CM | POA: Diagnosis not present

## 2020-12-22 DIAGNOSIS — K8020 Calculus of gallbladder without cholecystitis without obstruction: Secondary | ICD-10-CM | POA: Diagnosis not present

## 2020-12-22 DIAGNOSIS — C787 Secondary malignant neoplasm of liver and intrahepatic bile duct: Secondary | ICD-10-CM | POA: Diagnosis not present

## 2020-12-22 DIAGNOSIS — Z853 Personal history of malignant neoplasm of breast: Secondary | ICD-10-CM | POA: Diagnosis not present

## 2020-12-22 DIAGNOSIS — H5211 Myopia, right eye: Secondary | ICD-10-CM | POA: Diagnosis not present

## 2020-12-22 DIAGNOSIS — Z961 Presence of intraocular lens: Secondary | ICD-10-CM | POA: Diagnosis not present

## 2020-12-22 DIAGNOSIS — Z7189 Other specified counseling: Secondary | ICD-10-CM | POA: Diagnosis not present

## 2020-12-22 LAB — HM DIABETES EYE EXAM

## 2020-12-22 MED ORDER — GADOBUTROL 1 MMOL/ML IV SOLN
7.0000 mL | Freq: Once | INTRAVENOUS | Status: AC | PRN
  Administered 2020-12-22: 7 mL via INTRAVENOUS

## 2020-12-27 DIAGNOSIS — Z853 Personal history of malignant neoplasm of breast: Secondary | ICD-10-CM | POA: Diagnosis not present

## 2020-12-27 DIAGNOSIS — I129 Hypertensive chronic kidney disease with stage 1 through stage 4 chronic kidney disease, or unspecified chronic kidney disease: Secondary | ICD-10-CM | POA: Diagnosis not present

## 2020-12-27 DIAGNOSIS — D631 Anemia in chronic kidney disease: Secondary | ICD-10-CM | POA: Diagnosis not present

## 2020-12-27 DIAGNOSIS — E1122 Type 2 diabetes mellitus with diabetic chronic kidney disease: Secondary | ICD-10-CM | POA: Diagnosis not present

## 2020-12-27 DIAGNOSIS — N1832 Chronic kidney disease, stage 3b: Secondary | ICD-10-CM | POA: Diagnosis not present

## 2020-12-27 DIAGNOSIS — N184 Chronic kidney disease, stage 4 (severe): Secondary | ICD-10-CM | POA: Diagnosis not present

## 2020-12-27 MED FILL — Dexamethasone Sodium Phosphate Inj 100 MG/10ML: INTRAMUSCULAR | Qty: 1 | Status: AC

## 2020-12-27 NOTE — Progress Notes (Signed)
[place]  [phone] [phone]    ID: Darlene [name] DOB: October 23, 1950  MR#: [id number]  ASN#:[id number]  Patient Care Team: Isaac Bliss, Rayford Halsted, MD as PCP - General (Internal Medicine) Larey Dresser, MD as PCP - Advanced Heart Failure (Cardiology) Stark Klein, MD as Consulting Physician (General Surgery) Magrinat, Virgie Dad, MD as Consulting Physician (Oncology) Jacelyn Pi, MD as Consulting Physician (Endocrinology) [name], MD as Consulting Physician (Plastic Surgery) Gery Pray, MD as Consulting Physician (Radiation Oncology) Juanita Craver, MD as Consulting Physician (Gastroenterology) Alda Berthold, DO as Consulting Physician (Neurology) Starlyn Skeans, MD as Consulting Physician (Family Medicine) Sherlynn Stalls, MD as Consulting Physician (Ophthalmology) Salvadore Dom, MD as Consulting Physician (Obstetrics and Gynecology) Raina Mina, RPH-CPP (Pharmacist)  OTHER MD:   CHIEF COMPLAINT:  HER-2 positive, estrogen receptor negative breast cancer  CURRENT TREATMENT:  Enhertu   INTERVAL HISTORY:  Darlene [name] returns today for follow-up and treatment of her estrogen receptor negative but HER-2 amplified breast cancer.  Unfortunately her husband Darlene [name] was not able to accompany her today as he is having his own doctor visit to go to  Since her last visit, she underwent restaging liver MRI on 12/22/2020 showing: no residual, recurrent, or new hepatic metastatic disease.  She was switched to Enhertu on 10/05/2020 given apparent disease progression on PET on 10/03/2020.  She returns today for cycle 5  Lab Results  Component Value Date   CEA1 4.64 12/07/2020   CEA1 11.97 (H) 11/16/2020   CEA1 82.63 (H) 10/05/2020   CEA1 35.50 (H) 08/30/2020   CEA1 8.24 (H) 07/05/2020    REVIEW OF SYSTEMS: Darlene [name] was having problems with nausea from the Enhertu.  She has reviewed this with our pharmacal oncologist and  is now tolerating things much better.  She was severely constipated.  She saw the urgent care team for that and is starting Senokot-S today to get ahead of the problem.  Otherwise she is tolerating treatment remarkably well.  A detailed review of systems was otherwise stable   COVID 19 VACCINATION STATUS: Status post Pfizer x2, with booster September 2021   BREAST CANCER HISTORY: From the original intake note:  "Darlene [name]" had screening mammography at her gynecologist suggestive of a possible abnormality in the right breast. However right diagnostic mammogram 04/12/2013 at [facility] was negative. More recently however, she noted a lump in her right breast and brought it to her gynecologist's attention. On 07/18/2014 the patient had bilateral diagnostic mammography with tomosynthesis at [facility]. The breast density was category B. In the right breast upper outer quadrant there was an area of focal asymmetry with indistinct margins.[name] Ultrasound was obtained and showed in addition to the mass in question, measuring 1.7 cm, an abnormal-appearing lymph node in the right axillary tail.  On 07/19/2014 the patient underwent biopsy of the right breast massin question as well as a right axillary lymph node. Both were positive for invasive ductal carcinoma, grade 2, estrogen and progesterone receptor negative, with an MIB-1 of 90%, and HER-2 pending. Incidentally the lymph node biopsy showed a lymphocytic inflammatory component but no lymph node architecture.  Her subsequent history is as detailed above.   PAST MEDICAL HISTORY: Past Medical History:  Diagnosis Date   Anemia    hx of - during 1st round chemo   Anxiety    Arthritis    in fingers, shoulders   Back pain    Balance problem    Breast cancer ([place])  Breast cancer of upper-outer quadrant of right female breast ([place]) 07/22/2014   Bunion, right foot    CHF (congestive heart failure) ([place])    Chronic kidney disease    creatininei levels high per pt     Clumsiness    Constipation    Depression    Diabetes mellitus without complication ([place])    32+ years type 2    Diabetic retinopathy ([place])    torn retina   Eating disorder    binge eating   Epistaxis 08/26/2014   Fatty liver    Foot drop, left foot    HCAP (healthcare-associated pneumonia) 12/18/2014   Heart murmur    never had any problems   History of radiation therapy 04/05/15-05/19/15   right chest wall was treated to 50.4 Gy in 28 fractions, right mastectomy scar was treated to 10 Gy in 5 fractions   Hyperlipidemia    Hypertension    Joint pain    Multiple food allergies    Neuromuscular disorder ([place])    diabetic neuropathy   Obesity    Obstructive sleep apnea on CPAP    does not use cpap all the time has lost 160 lbs    Ovarian cyst rupture    possible   Pneumonia    Pneumonia 11/2014   Radiation 04/05/15-05/19/15   right chest wall 50.4 Gy, mastectomy scar 10 Gy   Sleep apnea    Thyroid nodule 06/2014    PAST SURGICAL HISTORY: Past Surgical History:  Procedure Laterality Date   APPENDECTOMY     BIOPSY THYROID Bilateral 06/2014   2 nodules - Benign    BREAST LUMPECTOMY WITH RADIOACTIVE SEED AND SENTINEL LYMPH NODE BIOPSY Right 12/27/2014   Procedure: BREAST LUMPECTOMY WITH RADIOACTIVE SEED AND SENTINEL LYMPH NODE BIOPSY;  Surgeon: Stark Klein, MD;  Location: [place];  Service: General;  Laterality: Right;   BREAST REDUCTION SURGERY Bilateral 01/06/2015   Procedure: BILATERAL BREAST REDUCTION, RIGHT ONCOPLASTIC RECONSTRUCTION),LEFT BREAST REDUCTION FOR SYMMETRY;  Surgeon: [name], MD;  Location: [place];  Service: Plastics;  Laterality: Bilateral;   BREATH TEK H PYLORI N/A 09/15/2012   Procedure: BREATH TEK H PYLORI;  Surgeon: Pedro Earls, MD;  Location: [facility];  Service: General;  Laterality: N/A;   BUNIONECTOMY Left 12/2013   CARPAL TUNNEL RELEASE Right    CARPAL TUNNEL RELEASE Left    CATARACT EXTRACTION     6/19   COLONOSCOPY      DUPUYTREN CONTRACTURE RELEASE     GASTRIC ROUX-EN-Y N/A 11/02/2012   Procedure: LAPAROSCOPIC ROUX-EN-Y GASTRIC;  Surgeon: Pedro Earls, MD;  Location: [facility];  Service: General;  Laterality: N/A;   IR GENERIC HISTORICAL  03/18/2016   IR US GUIDE VASC ACCESS LEFT 03/18/2016 Markus Daft, MD [facility]   IR GENERIC HISTORICAL  03/18/2016   IR FLUORO GUIDE CV LINE LEFT 03/18/2016 Markus Daft, MD [facility]   LAPAROSCOPIC APPENDECTOMY N/A 07/22/2015   Procedure: APPENDECTOMY LAPAROSCOPIC;  Surgeon: Michael Boston, MD;  Location: [facility];  Service: General;  Laterality: N/A;   MASTECTOMY Right 02/15/2015   modified   MODIFIED MASTECTOMY Right 02/15/2015   Procedure: RIGHT MODIFIED RADICAL MASTECTOMY;  Surgeon: Stark Klein, MD;  Location: [place];  Service: General;  Laterality: Right;   ORIF WRIST FRACTURE Left 05/11/2019   Procedure: OPEN REDUCTION INTERNAL FIXATION (ORIF) left distal radius fracture;  Surgeon: Renette Butters, MD;  Location: [facility];  Service: Orthopedics;  Laterality: Left;  with block  PORT-A-CATH REMOVAL Left 10/10/2015   Procedure: PORT REMOVAL;  Surgeon: Stark Klein, MD;  Location: [place];  Service: General;  Laterality: Left;   PORTACATH PLACEMENT Left 08/02/2014   Procedure: INSERTION PORT-A-CATH;  Surgeon: Stark Klein, MD;  Location: [place];  Service: General;  Laterality: Left;   PORTACATH PLACEMENT N/A 11/05/2016   Procedure: INSERTION PORT-A-CATH;  Surgeon: Stark Klein, MD;  Location: [place];  Service: General;  Laterality: N/A;   RETINAL LASER PROCEDURE     retina tear on left eye   ROTATOR CUFF REPAIR Right 2005   SCAR REVISION Right 12/08/2015   Procedure: COMPLEX REPAIR RIGHT CHEST 10-15CM;  Surgeon: [name], MD;  Location: [place];  Service: Plastics;  Laterality: Right;   TOE SURGERY  1970s   bone spur   TRIGGER FINGER RELEASE     x3    FAMILY HISTORY Family History  Problem Relation Age of Onset   CVA  Mother    Heart disease Mother    Sudden death Mother    Diabetes Father    Heart failure Father    Hypertension Father    Kidney disease Father    Obesity Father    Hypertension Sister    Diabetes Brother    Breast cancer Paternal Grandmother 59   Diabetes Paternal Uncle    Ovarian cancer Neg Hx   The patient's father died from complications of diabetes at age 62. The patient's mother died at age 59 with a subarachnoid hemorrhage. The patient had one brother, one sister. The only breast cancer was the patient's paternal grandmother diagnosed in her 48s. There is no history of ovarian cancer in the family.   GYNECOLOGIC HISTORY:  Patient's last menstrual period was 10/22/2007.  menarche age 73, the patient is GX P0. She went through the change of life in 2011. She did not take hormone replacement.   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 Her husband Darlene [name] is a retired Visual merchandiser (he taught at [employer]). He was diagnosed with prostate cancer in 2021. In May 2020 they moved to [facility] in the [place].  Darlene [name] has 2 children from an earlier marriage, Rockey Situ who teaches in [place], and Alena Blankenbeckler,  who works at the [employer] in in [place]. He has 1 grand son, aged 45. The patient and her husband are not church attenders.   ADVANCED DIRECTIVES:  Not in place   HEALTH MAINTENANCE: Social History   Tobacco Use   Smoking status: Never   Smokeless tobacco: Never  Vaping Use   Vaping Use: Never used  Substance Use Topics   Alcohol use: Yes    Alcohol/week: 1.0 - 2.0 standard drink    Types: 1 - 2 Standard drinks or equivalent per week    Comment: social   Drug use: No     Colonoscopy:  May 2016  PAP:  Bone density: 07/01/2016 at [facility] showed normal, with a T score of -0.7  Lipid  panel:  Allergies  Allergen Reactions   Nsaids Other (See Comments)    H/o gastric bypass - avoid NSAIDs   Tape Hives, Rash and Other (See Comments)    Adhesives in bandaids    Current Outpatient Medications  Medication Sig Dispense Refill   Accu-Chek Softclix Lancets lancets 1 each by Other route 4 (four)  times daily. Test blood sugars 4 x times daily 200 each 1   aspirin EC 81 MG tablet Take 1 tablet (81 mg total) by mouth daily. 90 tablet 3   Blood Glucose Calibration (ACCU-CHEK GUIDE CONTROL) LIQD 1 Bottle by In Vitro route as needed. 1 each 0   Blood Glucose Monitoring Suppl (ACCU-CHEK GUIDE) w/Device KIT 1 each by Does not apply route 4 (four) times daily. Use to test blood sugars 4x a day 1 kit 0   buPROPion (WELLBUTRIN SR) 200 MG 12 hr tablet Take one tablet by mouth once daily. 90 tablet 0   CALCIUM CITRATE-VITAMIN D PO Take 1 tablet by mouth 3 (three) times daily.      carvedilol (COREG) 6.25 MG tablet Take 1 tablet (6.25 mg total) by mouth 2 (two) times daily. 180 tablet 3   cholecalciferol (VITAMIN D) 1000 units tablet Take 1,000 Units by mouth daily.     Cyanocobalamin (VITAMIN B-12 PO) Take 1 tablet by mouth every 14 (fourteen) days.     dexamethasone (DECADRON) 4 MG tablet Take 2 tablets (8 mg total) by mouth daily. Start the day after chemotherapy for 2 days. 30 tablet 1   diphenhydrAMINE (BENADRYL) 25 MG tablet Take 25 mg by mouth daily as needed for allergies or sleep.  (Patient not taking: Reported on 12/07/2020)     glucose blood (ACCU-CHEK GUIDE) test strip Use Accu Chek test strips to check blood sugar 4 times daily. DX:E11.21 400 each 0   insulin aspart (NOVOLOG) 100 UNIT/ML injection Inject into the skin daily as needed for high blood sugar. Per patient, sliding scale with meals as needed     insulin glargine (LANTUS SOLOSTAR) 100 UNIT/ML Solostar Pen Inject 8 Units into the skin daily. (Patient taking differently: Inject 8 Units into the skin daily. Per patient, day  of chemotherapy and 2 days after, taking 12 units daily (Dr. Chalmers Cater)) 7 mL 3   loratadine (CLARITIN) 10 MG tablet Take 10 mg by mouth as needed for allergies.     melatonin 5 MG TABS Take 5 mg by mouth at bedtime.     NEXLIZET 180-10 MG TABS TAKE 1 TABLET BY MOUTH DAILY. 30 tablet 11   omeprazole (PRILOSEC) 40 MG capsule Take 1 capsule (40 mg total) by mouth daily. 30 capsule 1   ondansetron (ZOFRAN) 8 MG tablet Take 1 tablet (8 mg total) by mouth every 8 (eight) hours as needed for nausea or vomiting. Start on day 3 after chemo. 30 tablet 2   Pediatric Multivitamins-Iron (FLINTSTONES PLUS IRON) chewable tablet Chew 1 tablet by mouth 2 (two) times daily.     Probiotic Product ([employer]S PROBIOTIC PO) Take 1 capsule by mouth daily.      prochlorperazine (COMPAZINE) 10 MG tablet Take 1 tablet (10 mg total) by mouth every 6 (six) hours as needed (Nausea or vomiting). 30 tablet 1   Propylene Glycol (SYSTANE BALANCE OP) Place 1-2 drops into both eyes 3 (three) times daily as needed (for dry eyes).      senna-docusate (SENOKOT-S) 8.6-50 MG tablet Take 1 tablet by mouth daily as needed for moderate constipation.  (Patient not taking: Reported on 12/07/2020)     SURE COMFORT PEN NEEDLES 32G X 4 MM MISC Use as instructed to inject insulin and Victoza daily. 200 each 0   tobramycin (TOBREX) 0.3 % ophthalmic ointment Place 1 application into both eyes as needed (Takes day before eye injections).     vortioxetine HBr (TRINTELLIX) 20 MG TABS  tablet Take 1 tablet (20 mg total) by mouth at bedtime. 90 tablet 0   No current facility-administered medications for this visit.    OBJECTIVE: White woman in no acute distress  Vitals:   12/28/20 1227  BP: 139/67  Pulse: 73  Resp: 18  Temp: 97.9 F (36.6 C)  SpO2: 97%     Filed Weights   12/28/20 1227  Weight: 163 lb 1.6 oz (74 kg)   Body mass index is 24.09 kg/m.     ECOG FS:1 - Symptomatic but completely ambulatory    Sclerae unicteric, EOMs intact Wearing  a mask No cervical or supraclavicular adenopathy Lungs no rales or rhonchi Heart regular rate and rhythm Abd soft, nontender, positive bowel sounds MSK no focal spinal tenderness, no upper extremity lymphedema Neuro: nonfocal, well oriented, appropriate affect Breasts: The right breast is status postmastectomy with no evidence of chest wall recurrence.  The left breast and both axillae are benign.   LAB RESULTS:  CMP     Component Value Date/Time   NA 144 12/28/2020 1210   NA 145 (H) 01/04/2020 0951   NA 140 01/07/2017 0758   K 4.3 12/28/2020 1210   K 4.1 01/07/2017 0758   CL 108 12/28/2020 1210   CO2 25 12/28/2020 1210   CO2 24 01/07/2017 0758   GLUCOSE 112 (H) 12/28/2020 1210   GLUCOSE 134 01/07/2017 0758   BUN 32 (H) 12/28/2020 1210   BUN 33 (H) 01/04/2020 0951   BUN 33.6 (H) 01/07/2017 0758   CREATININE 1.86 (H) 12/28/2020 1210   CREATININE 1.5 (H) 01/07/2017 0758   CALCIUM 8.6 (L) 12/28/2020 1210   CALCIUM 9.0 04/12/2020 0923   CALCIUM 9.3 01/07/2017 0758   PROT 5.7 (L) 12/28/2020 1210   PROT 6.3 01/04/2020 0951   PROT 6.7 01/07/2017 0758   ALBUMIN 3.1 (L) 12/28/2020 1210   ALBUMIN 4.3 01/04/2020 0951   ALBUMIN 3.6 01/07/2017 0758   AST 25 12/28/2020 1210   AST 51 (H) 01/07/2017 0758   ALT 12 12/28/2020 1210   ALT 82 (H) 01/07/2017 0758   ALKPHOS 50 12/28/2020 1210   ALKPHOS 115 01/07/2017 0758   BILITOT 0.5 12/28/2020 1210   BILITOT 0.72 01/07/2017 0758   GFRNONAA 29 (L) 12/28/2020 1210   GFRNONAA 55 (L) 02/18/2013 0827   GFRAA 36 (L) 01/04/2020 0951   GFRAA 38 (L) 04/28/2018 0915   GFRAA 63 02/18/2013 0827    INo results found for: SPEP, UPEP  Lab Results  Component Value Date   WBC 3.9 (L) 12/28/2020   NEUTROABS 2.4 12/28/2020   HGB 9.0 (L) 12/28/2020   HCT 26.0 (L) 12/28/2020   MCV 99.6 12/28/2020   PLT 258 12/28/2020      Chemistry      Component Value Date/Time   NA 144 12/28/2020 1210   NA 145 (H) 01/04/2020 0951   NA 140 01/07/2017  0758   K 4.3 12/28/2020 1210   K 4.1 01/07/2017 0758   CL 108 12/28/2020 1210   CO2 25 12/28/2020 1210   CO2 24 01/07/2017 0758   BUN 32 (H) 12/28/2020 1210   BUN 33 (H) 01/04/2020 0951   BUN 33.6 (H) 01/07/2017 0758   CREATININE 1.86 (H) 12/28/2020 1210   CREATININE 1.5 (H) 01/07/2017 0758      Component Value Date/Time   CALCIUM 8.6 (L) 12/28/2020 1210   CALCIUM 9.0 04/12/2020 0923   CALCIUM 9.3 01/07/2017 0758   ALKPHOS 50 12/28/2020 1210   ALKPHOS 115  01/07/2017 0758   AST 25 12/28/2020 1210   AST 51 (H) 01/07/2017 0758   ALT 12 12/28/2020 1210   ALT 82 (H) 01/07/2017 0758   BILITOT 0.5 12/28/2020 1210   BILITOT 0.72 01/07/2017 0758       No results found for: LABCA2  No components found for: OXBDZ329  No results for input(s): INR in the last 168 hours.  Urinalysis    Component Value Date/Time   COLORURINE YELLOW 07/21/2015 [place] 07/21/2015 2328   LABSPEC 1.020 07/21/2015 2328   PHURINE 6.0 07/21/2015 2328   GLUCOSEU NEGATIVE 07/21/2015 2328   HGBUR TRACE (A) 07/21/2015 2328   BILIRUBINUR NEGATIVE 07/21/2015 2328   BILIRUBINUR neg 06/29/2013 [place] 07/21/2015 2328   PROTEINUR 30 (A) 07/21/2015 2328   UROBILINOGEN negative 06/29/2013 1017   NITRITE NEGATIVE 07/21/2015 2328   LEUKOCYTESUR NEGATIVE 07/21/2015 2328    STUDIES: MR LIVER W WO CONTRAST  Result Date: 12/22/2020 CLINICAL DATA:  Follow-up hepatic metastatic disease. History of breast cancer. EXAM: MRI ABDOMEN WITHOUT AND WITH CONTRAST TECHNIQUE: Multiplanar multisequence MR imaging of the abdomen was performed both before and after the administration of intravenous contrast. CONTRAST:  64m GADAVIST GADOBUTROL 1 MMOL/ML IV SOLN COMPARISON:  Prior MRI abdomen 05/07/2020 and 09/13/2020. PET-CT 10/03/2020 FINDINGS: Lower chest: The lung bases are grossly clear. No pulmonary lesions, pleural or pericardial effusion. Hepatobiliary: No discrete persistent hepatic  metastatic disease. On the prior MRI there was a heterogeneously enhancing lesion between segment 8 and segment 7 peripherally. There is some minimal residual scarring type changes in this area but no discrete persistent mass. No new hepatic lesions or intrahepatic biliary dilatation. The portal and hepatic veins are patent. The gallbladder contains small layering gallstones but no findings for acute cholecystitis. No common bile duct dilatation. Pancreas:  No mass, inflammation or ductal dilatation. Spleen:  Normal size. No focal lesions. Adrenals/Urinary Tract: Adrenal glands and kidneys are unremarkable and stable. No renal lesions or hydronephrosis. Stomach/Bowel: Stable surgical changes from gastric bypass surgery. The visualized small bowel and colon are grossly normal. Vascular/Lymphatic: Prior PET-CT demonstrated a hypermetabolic periportal lymph node measuring 11 mm near the right diaphragmatic crus. This is no longer identified. Small celiac axis and paraduodenal lymph nodes are stable. These were not hypermetabolic. Other:  No ascites or abdominal wall hernia. Musculoskeletal: No significant bony findings. IMPRESSION: 1. No residual, recurrent or new hepatic metastatic disease. Minimal residual scarring type changes in the peripheral aspect between segment 7 and segment 8. 2. No new hepatic lesions or intrahepatic biliary dilatation. 3. The hypermetabolic 11 mm periportal lymph node seen on the prior PET-CT near the right diaphragmatic crus is no longer identified. 4. Cholelithiasis. Electronically Signed   By: PMarijo SanesM.D.   On: 12/22/2020 18:51      ASSESSMENT: 70y.o. [place] woman status post right breast upper outer quadrant and right axillary lymph node biopsy 07/19/2014 for a cT2  pN1, stage IIB  invasive ductal carcinoma, grade 2, estrogen and progesterone receptor negative, with an MIB-1 of 90%, and HER-2 amplified  (1) neoadjuvant chemotherapy started 08/08/2014, consisting of  carboplatin, docetaxel, trastuzumab and pertuzumab given every 3 weeks 6, completed 11/21/2014  (a) breast MRI after initial 3 cycles shows a significant response  (b) breast MRI after 6 cycles shows a complete radiologic response   (2) trastuzumab continued through 10/02/2015 (to end of capecitabine therapy)  (a) echo 02/13/2015 shows an EF of 60-65%  (b)  echo 05/09/2015 shows an ejection fraction of 60-65%  (c) echocardiogram 06/27/2015 shows an ejection fraction of 55-60%.  (d) echocardiogram 01/22/2017 shows an ejection fraction of 55-60%  (3) right lumpectomy with sentinel lymph node biopsy on 12/27/14 showed a residual  ypT2 ypN1a, stage IIB invasive ductal carcinoma, grade 2, with repeat estrogen and progesterone receptors again negative  (4) status post right oncoplastic surgery (with left reduction mammoplasty) 01/06/2015 showing in the right breast multiple foci of intralymphatic carcinoma in random sections  (5)  Right modified radical mastectomy 02/15/2014 showed no residual invasive disease; there was DCIS but margins were negative; all 15 axillary lymph nodes were clear; ypTis ypN0  (a)  The patient has decided against reconstruction.  (6)  Postmastectomy radiation completed 05/18/2015 with capecitabine sensitization  (7) adjuvant capecitabine continued through 09/27/2015  (8) hepatic steatosis with increased fibrosis score (at risk for cirrhosis)  (9) thyroid biopsy (left lobe inferiorly) 2 shows benign tissue 06/28/2015  (10) LOCAL RECURRENCE:  (a) PET scan 12/06/2015 Notes a right retropectoral lymph node measuring 0.9 cm with an SUV max of 5.5  (b) repeat PET scan 03/11/2016 finds the right subpectoral lymph node to now measure 1.3 cm with an SUV max of 11.8.  (c) PET scan 05/07/2016 shows a significant response to T-DM 1  (d) PET scan 07/10/2017 is clear except for what appears to be a single new liver lesion   (11) started T-DM1 03/21/2016, completed 8 doses  08/13/2016  (a) PET scan 08/19/2016 shows a good response but measurable residual disease  (b) PET scan on 01/16/2017 shows a continuing response  (12) maintenance trastuzumab started 09/03/2016--changed to every 28 days as of 01/28/2017  (a) repeat echocardiogram 07/08/2016 shows an ejection fraction of 60-65%.  (b) echocardiogram on 10/08/2016 demonstrates LVEF of 55-60% (followed by Dr. Haroldine Laws)  (c) echo 01/22/2017 shows an ejection fraction in the 55-60% range  (d) echo 04/24/2017 shows an ejection fraction in the 55-60% range (recommendation for echo every 6 months by Dr. Aundra Dubin)  (e) trastuzumab changed to T-DM 1 after trastuzumab dose 07/15/2017  METASTATIC DISEASE: July 2019 (13)  Radiation to the area of persistent disease in the right upper chest completed 10/30/2016   (14) T-DM 1 resumed 08/12/2017, repeated every 21 days  (a) baseline liver MRI 08/02/2017 shows a 1.4 cm mass, "hot" on PET 07/10/2017  (b) repeat liver MRI 11/18/2017 after 3 cycles of T-DM 1, shows slight enlargement  (c) T-DM 1 discontinued after 09/23/2017 dose  (15) trastuzumab resumed 11/18/2017, to be repeated every 4 weeks,   (a) changed back to every 3 weeks in 03/2018  (b) echo 09/18/2018 shows an ejection fraction in the 50-55% range.  (c) pertuzumab added with 10/06/2018 dose  (d) trastuzumab/pertuzumab changed to every 4 weeks after 01/19/2019 dose  (e) liver MRI 04/27/2019 shows mild sclerosis, no change in right hepatic lobe treated metastases, with no new lesions; prominent porta hepatis nodes were felt likely reactive  (f) abdominal ultrasound 06/28/2027 shows no focal liver lesions, no ascites  (g) liver MRI 09/20/2019 shows stable disease (0.6 cm).  (16) liver biopsy 11/03/2017 shows adenocarcinoma, most consistent with an upper gastrointestinal primary; this tumor was estrogen receptor and HER-2 negative  (a) GI workup for occult primary ([place]) negative  (b) PET 12/09/2017 shows no obvious  primary.  There is a right subpectoral lymph node noted as well  (c) CA 27-29 and CA 19-9 uninformative; CEA 6.72 OCT 2019  (d) negative hepatitis B serologies May 2021   (  17) started Abraxane on 12/111/2019, given days 1 and 8 on a 21 day cycle   (a) restaging with MRI liver 02/24/2018 shows a gratifying initial response  (b) PET scan 05/14/2018 shows resolution of the measurable disease in the liver  (c) liver MRI 09/14/2018 shows no change compared to prior PET and MRI.  (d) Abraxane discontinued after 09/01/2018 dose  (18) continues on pertuzumab and trastuzumab every 4 weeks (see #15 above)   (a) MRI of the abdomen 05/07/2020 shows no measurable disease  (b) repeat abdominal MRI 09/13/2020 shows a new 3.4 cm right subcapsular lower liver lesion (this measured 0.6 cm August 2021 and was not seen April 2022)  (c) PET scan 10/03/2020 shows the right liver dome lesion which is highly hypermetabolic  (19) started Enhertu 10/05/2020, repeated every 21 days.   PLAN: Darlene [name] is having an excellent response to Enhertu and right now we have no measurable disease in the liver, which is optimal.  We could consider switching to a simpler treatment now but what I would like to do is give her 2 additional cycles of Enhertu namely today and 3 weeks from today after that, when she returns to see [name] in January, we can consider whether we should go to Kadcyla, trastuzumab plus pertuzumab, or trastuzumab alone or any other option.  We reviewed her nausea medications today and she is taking them appropriately.  She is not having excessive problems with blood sugars from the steroids-- she is using some NovoLog as prescribed if her sugars get greater than 200.  She knows to call for any other issue that may develop before the next visit.  Total encounter time 25 minutes.Sarajane Jews C. Journi Moffa, MD 12/28/20 1:38 PM Medical Oncology and Hematology [facility] [place], [place]  [address] Tel. [phone]    Fax. [phone]   I, Wilburn Mylar, am acting as scribe for Dr. Virgie Dad. Savannah Morford.  I, Lurline Del MD, have reviewed the above documentation for accuracy and completeness, and I agree with the above.   *Total Encounter Time as defined by the Centers for Medicare and Medicaid Services includes, in addition to the face-to-face time of a patient visit (documented in the note above) non-face-to-face time: obtaining and reviewing outside history, ordering and reviewing medications, tests or procedures, care coordination (communications with other health care professionals or caregivers) and documentation in the medical record.

## 2020-12-28 ENCOUNTER — Other Ambulatory Visit: Payer: Self-pay

## 2020-12-28 ENCOUNTER — Inpatient Hospital Stay: Payer: Medicare PPO | Admitting: Oncology

## 2020-12-28 ENCOUNTER — Inpatient Hospital Stay: Payer: Medicare PPO | Attending: Oncology

## 2020-12-28 ENCOUNTER — Encounter: Payer: Self-pay | Admitting: Internal Medicine

## 2020-12-28 ENCOUNTER — Inpatient Hospital Stay: Payer: Medicare PPO

## 2020-12-28 ENCOUNTER — Telehealth: Payer: Self-pay | Admitting: *Deleted

## 2020-12-28 VITALS — BP 139/67 | HR 73 | Temp 97.9°F | Resp 18 | Ht 69.0 in | Wt 163.1 lb

## 2020-12-28 DIAGNOSIS — C50811 Malignant neoplasm of overlapping sites of right female breast: Secondary | ICD-10-CM | POA: Diagnosis not present

## 2020-12-28 DIAGNOSIS — C773 Secondary and unspecified malignant neoplasm of axilla and upper limb lymph nodes: Secondary | ICD-10-CM | POA: Insufficient documentation

## 2020-12-28 DIAGNOSIS — Z923 Personal history of irradiation: Secondary | ICD-10-CM | POA: Diagnosis not present

## 2020-12-28 DIAGNOSIS — Z171 Estrogen receptor negative status [ER-]: Secondary | ICD-10-CM | POA: Diagnosis not present

## 2020-12-28 DIAGNOSIS — C50411 Malignant neoplasm of upper-outer quadrant of right female breast: Secondary | ICD-10-CM

## 2020-12-28 DIAGNOSIS — C787 Secondary malignant neoplasm of liver and intrahepatic bile duct: Secondary | ICD-10-CM

## 2020-12-28 DIAGNOSIS — Z95828 Presence of other vascular implants and grafts: Secondary | ICD-10-CM

## 2020-12-28 DIAGNOSIS — Z5112 Encounter for antineoplastic immunotherapy: Secondary | ICD-10-CM | POA: Insufficient documentation

## 2020-12-28 DIAGNOSIS — Z9011 Acquired absence of right breast and nipple: Secondary | ICD-10-CM | POA: Diagnosis not present

## 2020-12-28 DIAGNOSIS — Z7189 Other specified counseling: Secondary | ICD-10-CM

## 2020-12-28 LAB — CBC WITH DIFFERENTIAL (CANCER CENTER ONLY)
Abs Immature Granulocytes: 0.04 10*3/uL (ref 0.00–0.07)
Basophils Absolute: 0 10*3/uL (ref 0.0–0.1)
Basophils Relative: 0 %
Eosinophils Absolute: 0.1 10*3/uL (ref 0.0–0.5)
Eosinophils Relative: 3 %
HCT: 26 % — ABNORMAL LOW (ref 36.0–46.0)
Hemoglobin: 9 g/dL — ABNORMAL LOW (ref 12.0–15.0)
Immature Granulocytes: 1 %
Lymphocytes Relative: 18 %
Lymphs Abs: 0.7 10*3/uL (ref 0.7–4.0)
MCH: 34.5 pg — ABNORMAL HIGH (ref 26.0–34.0)
MCHC: 34.6 g/dL (ref 30.0–36.0)
MCV: 99.6 fL (ref 80.0–100.0)
Monocytes Absolute: 0.6 10*3/uL (ref 0.1–1.0)
Monocytes Relative: 16 %
Neutro Abs: 2.4 10*3/uL (ref 1.7–7.7)
Neutrophils Relative %: 62 %
Platelet Count: 258 10*3/uL (ref 150–400)
RBC: 2.61 MIL/uL — ABNORMAL LOW (ref 3.87–5.11)
RDW: 15.3 % (ref 11.5–15.5)
WBC Count: 3.9 10*3/uL — ABNORMAL LOW (ref 4.0–10.5)
nRBC: 0 % (ref 0.0–0.2)

## 2020-12-28 LAB — CMP (CANCER CENTER ONLY)
ALT: 12 U/L (ref 0–44)
AST: 25 U/L (ref 15–41)
Albumin: 3.1 g/dL — ABNORMAL LOW (ref 3.5–5.0)
Alkaline Phosphatase: 50 U/L (ref 38–126)
Anion gap: 11 (ref 5–15)
BUN: 32 mg/dL — ABNORMAL HIGH (ref 8–23)
CO2: 25 mmol/L (ref 22–32)
Calcium: 8.6 mg/dL — ABNORMAL LOW (ref 8.9–10.3)
Chloride: 108 mmol/L (ref 98–111)
Creatinine: 1.86 mg/dL — ABNORMAL HIGH (ref 0.44–1.00)
GFR, Estimated: 29 mL/min — ABNORMAL LOW (ref >60)
Glucose, Bld: 112 mg/dL — ABNORMAL HIGH (ref 70–99)
Potassium: 4.3 mmol/L (ref 3.5–5.1)
Sodium: 144 mmol/L (ref 135–145)
Total Bilirubin: 0.5 mg/dL (ref 0.3–1.2)
Total Protein: 5.7 g/dL — ABNORMAL LOW (ref 6.5–8.1)

## 2020-12-28 MED ORDER — SODIUM CHLORIDE 0.9 % IV SOLN
10.0000 mg | Freq: Once | INTRAVENOUS | Status: AC
  Administered 2020-12-28: 10 mg via INTRAVENOUS
  Filled 2020-12-28: qty 10

## 2020-12-28 MED ORDER — DIPHENHYDRAMINE HCL 25 MG PO CAPS
25.0000 mg | ORAL_CAPSULE | Freq: Once | ORAL | Status: AC
  Administered 2020-12-28: 25 mg via ORAL
  Filled 2020-12-28: qty 1

## 2020-12-28 MED ORDER — SODIUM CHLORIDE 0.9% FLUSH
10.0000 mL | Freq: Once | INTRAVENOUS | Status: AC
  Administered 2020-12-28: 10 mL

## 2020-12-28 MED ORDER — PALONOSETRON HCL INJECTION 0.25 MG/5ML
0.2500 mg | Freq: Once | INTRAVENOUS | Status: AC
  Administered 2020-12-28: 0.25 mg via INTRAVENOUS
  Filled 2020-12-28: qty 5

## 2020-12-28 MED ORDER — HEPARIN SOD (PORK) LOCK FLUSH 100 UNIT/ML IV SOLN
500.0000 [IU] | Freq: Once | INTRAVENOUS | Status: AC | PRN
  Administered 2020-12-28: 500 [IU]

## 2020-12-28 MED ORDER — ACETAMINOPHEN 325 MG PO TABS
650.0000 mg | ORAL_TABLET | Freq: Once | ORAL | Status: AC
  Administered 2020-12-28: 650 mg via ORAL
  Filled 2020-12-28: qty 2

## 2020-12-28 MED ORDER — DEXTROSE 5 % IV SOLN: Freq: Once | INTRAVENOUS | Status: AC

## 2020-12-28 MED ORDER — SODIUM CHLORIDE 0.9% FLUSH
10.0000 mL | INTRAVENOUS | Status: DC | PRN
  Administered 2020-12-28: 10 mL

## 2020-12-28 MED ORDER — FAM-TRASTUZUMAB DERUXTECAN-NXKI CHEMO 100 MG IV SOLR
5.4000 mg/kg | Freq: Once | INTRAVENOUS | Status: AC
  Administered 2020-12-28: 410 mg via INTRAVENOUS
  Filled 2020-12-28: qty 20.5

## 2020-12-28 NOTE — Patient Instructions (Signed)
[facility] MEDICAL ONCOLOGY  Discharge Instructions: ?Thank you for choosing [facility] to provide your oncology and hematology care.  ? ?If you have a lab appointment with the Cancer Center, please go directly to the Cancer Center and check in at the registration area. ?  ?Wear comfortable clothing and clothing appropriate for easy access to any Portacath or PICC line.  ? ?We strive to give you quality time with your provider. You may need to reschedule your appointment if you arrive late (15 or more minutes).  Arriving late affects you and other patients whose appointments are after yours.  Also, if you miss three or more appointments without notifying the office, you may be dismissed from the clinic at the provider?s discretion.    ?  ?For prescription refill requests, have your pharmacy contact our office and allow 72 hours for refills to be completed.   ? ?Today you received the following chemotherapy and/or immunotherapy agents: Enhertu.    ?  ?To help prevent nausea and vomiting after your treatment, we encourage you to take your nausea medication as directed. ? ?BELOW ARE SYMPTOMS THAT SHOULD BE REPORTED IMMEDIATELY: ?*FEVER GREATER THAN 100.4 F (38 ?C) OR HIGHER ?*CHILLS OR SWEATING ?*NAUSEA AND VOMITING THAT IS NOT CONTROLLED WITH YOUR NAUSEA MEDICATION ?*UNUSUAL SHORTNESS OF BREATH ?*UNUSUAL BRUISING OR BLEEDING ?*URINARY PROBLEMS (pain or burning when urinating, or frequent urination) ?*BOWEL PROBLEMS (unusual diarrhea, constipation, pain near the anus) ?TENDERNESS IN MOUTH AND THROAT WITH OR WITHOUT PRESENCE OF ULCERS (sore throat, sores in mouth, or a toothache) ?UNUSUAL RASH, SWELLING OR PAIN  ?UNUSUAL VAGINAL DISCHARGE OR ITCHING  ? ?Items with * indicate a potential emergency and should be followed up as soon as possible or go to the Emergency Department if any problems should occur. ? ?Please show the CHEMOTHERAPY ALERT CARD or IMMUNOTHERAPY ALERT CARD at check-in to  the Emergency Department and triage nurse. ? ?Should you have questions after your visit or need to cancel or reschedule your appointment, please contact [facility]  Dept: [phone]  and follow the prompts.  Office hours are 8:00 a.m. to 4:30 p.m. Monday - Friday. Please note that voicemails left after 4:00 p.m. may not be returned until the following business day.  We are closed weekends and major holidays. You have access to a nurse at all times for urgent questions. Please call the main number to the clinic Dept: [phone] and follow the prompts. ? ? ?For any non-urgent questions, you may also contact your provider using MyChart. We now offer e-Visits for anyone 18 and older to request care online for non-urgent symptoms. For details visit [email or web address]. ?  ?Also download the MyChart app! Go to the app store, search "MyChart", open the app, select , and log in with your MyChart username and password. ? ?Due to Covid, a mask is required upon entering the hospital/clinic. If you do not have a mask, one will be given to you upon arrival. For doctor visits, patients may have 1 support person aged 18 or older with them. For treatment visits, patients cannot have anyone with them due to current Covid guidelines and our immunocompromised population.  ? ?

## 2020-12-28 NOTE — Telephone Encounter (Signed)
Per MD - ok to proceed with treatment today with creatinine of 1.8

## 2020-12-29 ENCOUNTER — Encounter: Payer: Self-pay | Admitting: Oncology

## 2021-01-02 DIAGNOSIS — C50911 Malignant neoplasm of unspecified site of right female breast: Secondary | ICD-10-CM | POA: Diagnosis not present

## 2021-01-03 DIAGNOSIS — H3582 Retinal ischemia: Secondary | ICD-10-CM | POA: Diagnosis not present

## 2021-01-03 DIAGNOSIS — E113513 Type 2 diabetes mellitus with proliferative diabetic retinopathy with macular edema, bilateral: Secondary | ICD-10-CM | POA: Diagnosis not present

## 2021-01-07 DIAGNOSIS — E1165 Type 2 diabetes mellitus with hyperglycemia: Secondary | ICD-10-CM | POA: Diagnosis not present

## 2021-01-10 ENCOUNTER — Encounter (INDEPENDENT_AMBULATORY_CARE_PROVIDER_SITE_OTHER): Payer: Self-pay | Admitting: Family Medicine

## 2021-01-10 NOTE — Telephone Encounter (Signed)
Pt last seen by Dr. Beasley.  

## 2021-01-12 ENCOUNTER — Other Ambulatory Visit: Payer: Self-pay | Admitting: *Deleted

## 2021-01-12 DIAGNOSIS — K769 Liver disease, unspecified: Secondary | ICD-10-CM

## 2021-01-16 ENCOUNTER — Other Ambulatory Visit: Payer: Self-pay

## 2021-01-16 ENCOUNTER — Ambulatory Visit (HOSPITAL_BASED_OUTPATIENT_CLINIC_OR_DEPARTMENT_OTHER)
Admission: RE | Admit: 2021-01-16 | Discharge: 2021-01-16 | Disposition: A | Discharge status: Home / Self Care | Payer: Medicare PPO | Source: Ambulatory Visit | Attending: Cardiology | Admitting: Cardiology

## 2021-01-16 ENCOUNTER — Encounter: Payer: Self-pay | Admitting: Oncology

## 2021-01-16 ENCOUNTER — Other Ambulatory Visit (HOSPITAL_COMMUNITY): Payer: Self-pay

## 2021-01-16 ENCOUNTER — Encounter: Payer: Self-pay | Admitting: Nurse Practitioner

## 2021-01-16 ENCOUNTER — Ambulatory Visit (HOSPITAL_COMMUNITY)
Admission: RE | Admit: 2021-01-16 | Discharge: 2021-01-16 | Disposition: A | Discharge status: Home / Self Care | Payer: Medicare PPO | Source: Ambulatory Visit | Attending: Cardiology | Admitting: Cardiology

## 2021-01-16 VITALS — BP 115/67 | HR 86 | Wt 159.0 lb

## 2021-01-16 DIAGNOSIS — E1122 Type 2 diabetes mellitus with diabetic chronic kidney disease: Secondary | ICD-10-CM | POA: Insufficient documentation

## 2021-01-16 DIAGNOSIS — N184 Chronic kidney disease, stage 4 (severe): Secondary | ICD-10-CM | POA: Insufficient documentation

## 2021-01-16 DIAGNOSIS — I509 Heart failure, unspecified: Secondary | ICD-10-CM | POA: Diagnosis not present

## 2021-01-16 DIAGNOSIS — G4733 Obstructive sleep apnea (adult) (pediatric): Secondary | ICD-10-CM | POA: Insufficient documentation

## 2021-01-16 DIAGNOSIS — I10 Essential (primary) hypertension: Secondary | ICD-10-CM | POA: Diagnosis not present

## 2021-01-16 DIAGNOSIS — Z0189 Encounter for other specified special examinations: Secondary | ICD-10-CM

## 2021-01-16 DIAGNOSIS — I130 Hypertensive heart and chronic kidney disease with heart failure and stage 1 through stage 4 chronic kidney disease, or unspecified chronic kidney disease: Secondary | ICD-10-CM | POA: Insufficient documentation

## 2021-01-16 DIAGNOSIS — C50911 Malignant neoplasm of unspecified site of right female breast: Secondary | ICD-10-CM

## 2021-01-16 DIAGNOSIS — C50411 Malignant neoplasm of upper-outer quadrant of right female breast: Secondary | ICD-10-CM | POA: Diagnosis not present

## 2021-01-16 DIAGNOSIS — I2510 Atherosclerotic heart disease of native coronary artery without angina pectoris: Secondary | ICD-10-CM | POA: Diagnosis not present

## 2021-01-16 LAB — ECHOCARDIOGRAM COMPLETE
AR max vel: 3.27 cm2
AV Area VTI: 2.8 cm2
AV Area mean vel: 2.85 cm2
AV Mean grad: 3 mmHg
AV Peak grad: 4.7 mmHg
Ao pk vel: 1.08 m/s
Area-P 1/2: 3.4 cm2
Calc EF: 43.4 %
S' Lateral: 3.4 cm
Single Plane A2C EF: 40 %
Single Plane A4C EF: 46.5 %

## 2021-01-16 MED ORDER — KERENDIA 10 MG PO TABS: 10.0000 mg | ORAL_TABLET | Freq: Every day | ORAL | 6 refills | Status: DC

## 2021-01-16 NOTE — Patient Instructions (Signed)
Start Kerendia 10 mg Daily  Your physician recommends that you return for lab work in: 2 weeks  Your physician recommends that you schedule a follow-up appointment in: 6 months with an echocardiogram (June 2023), **PLEASE CALL OUR OFFICE IN April TO SCHEDULE THESE APPOINTMENTS  If you have any questions or concerns before your next appointment please send [name] a message through [facility] or call our office at [phone].    TO LEAVE A MESSAGE FOR THE NURSE SELECT OPTION 2, PLEASE LEAVE A MESSAGE INCLUDING: YOUR NAME DATE OF BIRTH CALL BACK NUMBER REASON FOR CALL**this is important as we prioritize the call backs  YOU WILL RECEIVE A CALL BACK THE SAME DAY AS LONG AS YOU CALL BEFORE 4:00 PM  At the [facility], you and your health needs are our priority. As part of our continuing mission to provide you with exceptional heart care, we have created designated Provider Care Teams. These Care Teams include your primary Cardiologist (physician) and Advanced Practice Providers (APPs- Physician Assistants and Nurse Practitioners) who all work together to provide you with the care you need, when you need it.   You may see any of the following providers on your designated Care Team at your next follow up: Dr Glori Bickers Dr Haynes Kerns, NP Lyda Jester, [place] [facility] [place], [place] Audry Riles, PharmD   Please be sure to bring in all your medications bottles to every appointment.

## 2021-01-16 NOTE — Progress Notes (Signed)
Patient ID: Darlene [name], female   DOB: 30-Jun-1950, 70 y.o.   MRN: [id number] Oncologist: Dr Darlene [name]   HPI: Ms Darlene [name] is a 70 y.o.with history of R breast cancer, right axillary lymph node biopsy 07/19/2014 for a cT2  pN1, stage IIB  invasive ductal carcinoma, grade 2, estrogen and progesterone receptor negative, with an MIB-1 of 90%, and HER-2 positive , OSA -> CPAP, bariatric surgery, and DM, referred to cardio-oncology clinic by Dr Darlene [name]  Completed neoadjuvant chemotherapy with carboplatin, docetaxel, trastuzumab and pertuzumab given every 3 weeks 6. Trastuzumab + capecitabine were completed in 9/17.  She had lumpectomy 12/16 and had mastectomy in 1/17.  She has completed radiation.   She was found in 11/17 to have a recurrence by PET => right retropectoral lymph node enlarged. In 2/18, she started on treatment with ado-trastuzumab emtansine, now back on Herceptin.  Echo prior to treatment showed stable EF.  Starting in 6/19, she was transitioned [facility]   She is now off Kadcyla and back on Herceptin/Perjeta for use long-term.     Echo in 2/21 showed EF 55-60% with more negative strain.   Coronary artery calcium score was done in 2/21, this showed CAC 3653 Agatston units, placing her in the 99th percentile. She was started on a Crestor but LFTs rose.  She had RUQ [name] without significant abnormality.  Hepatitis labs were negative.  The statin was stopped.  Cardiolite in 3/21 showed EF 51%, no ischemia/infarction.  She ended up on bempedoic acid.   Echo in 6/21 showed EF 55-60% with GLS -17.6%.  Echo today showed EF 55-60%, GLS -15.8%, normal RV.  Echo in 2/22 showed EF 55-60% with GLS -27.5%.  Echo in 9/22 showed EF 55% but strain analysis was not done.   She was found to have liver lesion concerning for metastatic disease.  She has been started on Fam-trastuzumab deruxtecan (Enhertu).  Echo was done today and reviewed, EF 55-60%, normal RV, strain images suboptimal.   She  comes in today for followup of CAD and chemotherapy with trastuzumab.  Weight is down 7 lbs. She is off losartan and dapagliflozin due to low BP and orthostatic symptoms.  Currently doing well with no lightheadedness.  No chest pain or exertional dyspnea.    Labs (1/21): LDL 75 Labs (4/21): LDL 42, AST 70 => 125, ALT 117 => 255 Labs (10/21): LDL 18, HDL 58,  K 3.8, creatinine 1.88 Labs (1/22): K 4.1, creatinine 1.85 Labs (9/22): K 4.4, creatinine 2.38, LDL 26  ECG (personally reviewed): NSR, 1st degree AVB, PVCs, LAFB  Echo (7/16) with EF 60-65%, grade II diastolic dysfunction.  Echo (10/16) with EF 66%, GLS -17%. Echo (1/17) with EF 60-65%, grade II diastolic dysfunction, lateral s' 10, GLS -18.8% Echo (4/17) with EF 60-65%, grade II diastolic dysfunction, lateral s' 9.5, strain poorly traced and off axis, normal RV size and systolic function.  Echo (6/17) with EF 55-60%, aortic sclerosis without stenosis, difficult images for strain, probably not accurate.  Echo (10/17) with EF 55-60%, mild LVH, moderate diastolic dysfunction, normal RV size and systolic function.  Echo (2/18) with EF 55-60%, GLS -15% but technically difficult study Echo (6/18) with EF 60-65%, GLS -15% but technically difficult study.  Echo (9/18) with EF 55-60%, GLS -19.8%, grade II diastolic dysfunction.  Echo (1/19) with EF 55-60%, normal RV size and systolic function, GLS -76.1% (done with different machine).  Echo (4/19) with EF 55-60%, mild LVH, GLS -17% Echo (9/19) with EF 55-60%, GLS -  17.1%, normal RV size and systolic function.  Echo (8/20): EF 50-55%.  Echo (11/20): EF 50-55%, GLS -12.7% (not ideal tracing), mildly decreased RV systolic function.   Echo (2/21): EF 55-60%, GLS -17.9%, grade II diastolic dysfunction, normal RV Echo (6/21): EF 55-60% with GLS -17.6% Echo (10/21): EF 55-60%, normal RV, GLS -15.8%.  Echo (2/22): EF 55-60% with GLS -27.5%. Echo (9/22): EF 55%, strain analysis not done.  Echo  (12/22): EF 55-60%, normal RV, strain images suboptimal.  Review of Systems: All systems reviewed and negative except as per HPI.   Past Medical History:  Diagnosis Date   Anemia    hx of - during 1st round chemo   Anxiety    Arthritis    in fingers, shoulders   Back pain    Balance problem    Breast cancer (Darlene [name])    Breast cancer of upper-outer quadrant of right female breast (Darlene [name]) 07/22/2014   Bunion, right foot    CHF (congestive heart failure) (HCC)    Chronic kidney disease    creatininei levels high per pt    Clumsiness    Constipation    Depression    Diabetes mellitus without complication ([place])    02+ years type 2    Diabetic retinopathy ([place])    torn retina   Eating disorder    binge eating   Epistaxis 08/26/2014   Fatty liver    Foot drop, left foot    HCAP (healthcare-associated pneumonia) 12/18/2014   Heart murmur    never had any problems   History of radiation therapy 04/05/15-05/19/15   right chest wall was treated to 50.4 Gy in 28 fractions, right mastectomy scar was treated to 10 Gy in 5 fractions   Hyperlipidemia    Hypertension    Joint pain    Multiple food allergies    Neuromuscular disorder ([place])    diabetic neuropathy   Obesity    Obstructive sleep apnea on CPAP    does not use cpap all the time has lost 160 lbs    Ovarian cyst rupture    possible   Pneumonia    Pneumonia 11/2014   Radiation 04/05/15-05/19/15   right chest wall 50.4 Gy, mastectomy scar 10 Gy   Sleep apnea    Thyroid nodule 06/2014    Current Outpatient Medications  Medication Sig Dispense Refill   Accu-Chek Softclix Lancets lancets 1 each by Other route 4 (four) times daily. Test blood sugars 4 x times daily 200 each 1   aspirin EC 81 MG tablet Take 1 tablet (81 mg total) by mouth daily. 90 tablet 3   Blood Glucose Calibration (ACCU-CHEK GUIDE CONTROL) LIQD 1 Bottle by In Vitro route as needed. 1 each 0   Blood Glucose Monitoring Suppl (ACCU-CHEK GUIDE) w/Device KIT 1 each by  Does not apply route 4 (four) times daily. Use to test blood sugars 4x a day 1 kit 0   buPROPion (WELLBUTRIN SR) 200 MG 12 hr tablet Take one tablet by mouth once daily. 90 tablet 0   CALCIUM CITRATE-VITAMIN D PO Take 1 tablet by mouth 3 (three) times daily.      carvedilol (COREG) 6.25 MG tablet Take 1 tablet (6.25 mg total) by mouth 2 (two) times daily. 180 tablet 3   cholecalciferol (VITAMIN D) 1000 units tablet Take 1,000 Units by mouth daily.     Cyanocobalamin (VITAMIN B-12 PO) Take 1 tablet by mouth every 14 (fourteen) days.     dexamethasone (DECADRON) 4  MG tablet Take 2 tablets (8 mg total) by mouth daily. Start the day after chemotherapy for 2 days. 30 tablet 1   diphenhydrAMINE (BENADRYL) 25 MG tablet Take 25 mg by mouth daily as needed for allergies or sleep.     Finerenone (KERENDIA) 10 MG TABS Take 10 mg by mouth daily. 30 tablet 6   glucose blood (ACCU-CHEK GUIDE) test strip Use Accu Chek test strips to check blood sugar 4 times daily. DX:E11.21 400 each 0   insulin aspart (NOVOLOG) 100 UNIT/ML injection Inject into the skin daily as needed for high blood sugar. Per patient, sliding scale with meals as needed     insulin glargine (LANTUS SOLOSTAR) 100 UNIT/ML Solostar Pen Inject 8 Units into the skin daily. 7 mL 3   loratadine (CLARITIN) 10 MG tablet Take 10 mg by mouth as needed for allergies.     melatonin 5 MG TABS Take 5 mg by mouth at bedtime.     NEXLIZET 180-10 MG TABS TAKE 1 TABLET BY MOUTH DAILY. 30 tablet 11   omeprazole (PRILOSEC) 40 MG capsule Take 1 capsule (40 mg total) by mouth daily. 30 capsule 1   ondansetron (ZOFRAN) 8 MG tablet Take 1 tablet (8 mg total) by mouth every 8 (eight) hours as needed for nausea or vomiting. Start on day 3 after chemo. 30 tablet 2   Pediatric Multivitamins-Iron (FLINTSTONES PLUS IRON) chewable tablet Chew 1 tablet by mouth 2 (two) times daily.     Probiotic Product ([employer] PROBIOTIC PO) Take 1 capsule by mouth daily.      prochlorperazine  (COMPAZINE) 10 MG tablet Take 1 tablet (10 mg total) by mouth every 6 (six) hours as needed (Nausea or vomiting). 30 tablet 1   Propylene Glycol (SYSTANE BALANCE OP) Place 1-2 drops into both eyes 3 (three) times daily as needed (for dry eyes).      senna-docusate (SENOKOT-S) 8.6-50 MG tablet Take 1 tablet by mouth daily as needed for moderate constipation.     SURE COMFORT PEN NEEDLES 32G X 4 MM MISC Use as instructed to inject insulin and Victoza daily. 200 each 0   tobramycin (TOBREX) 0.3 % ophthalmic ointment Place 1 application into both eyes as needed (Takes day before eye injections).     vortioxetine HBr (TRINTELLIX) 20 MG TABS tablet Take 1 tablet (20 mg total) by mouth at bedtime. 90 tablet 0   No current facility-administered medications for this encounter.    Allergies  Allergen Reactions   Nsaids Other (See Comments)    H/o gastric bypass - avoid NSAIDs   Tape Hives, Rash and Other (See Comments)    Adhesives in bandaids      Social History   Socioeconomic History   Marital status: Married    Spouse name: Darlene [name]   Number of children: 0   Years of education: Water quality scientist   Highest education level: Not on file  Occupational History   Occupation: Pensions consultant: [employer]  Tobacco Use   Smoking status: Never   Smokeless tobacco: Never  Vaping Use   Vaping Use: Never used  Substance and Sexual Activity   Alcohol use: Yes    Alcohol/week: 1.0 - 2.0 standard drink    Types: 1 - 2 Standard drinks or equivalent per week    Comment: social   Drug use: No   Sexual activity: Yes    Partners: Male    Birth control/protection: Post-menopausal, Other-see comments    Comment: husband  vasectomy  Other Topics Concern   Not on file  Social History Narrative   Patient is married Darlene [name]) and lives at home with her husband.   Patient does not have any children.   Patient is working for a [employer].   Patient has a Bachelor's degree.    Patient is right-handed.   Patient does not drink any caffeine.   Social Determinants of Health   Financial Resource Strain: Not on file  Food Insecurity: Not on file  Transportation Needs: Not on file  Physical Activity: Not on file  Stress: Not on file  Social Connections: Not on file  Intimate Partner Violence: Not on file      Family History  Problem Relation Age of Onset   CVA Mother    Heart disease Mother    Sudden death Mother    Diabetes Father    Heart failure Father    Hypertension Father    Kidney disease Father    Obesity Father    Hypertension Sister    Diabetes Brother    Breast cancer Paternal Grandmother 83   Diabetes Paternal Uncle    Ovarian cancer Neg Hx     Vitals:   01/16/21 1023  BP: 115/67  Pulse: 86  SpO2: 97%  Weight: 72.1 kg (159 lb)    PHYSICAL EXAM: General: NAD Neck: No JVD, no thyromegaly or thyroid nodule.  Lungs: Clear to auscultation bilaterally with normal respiratory effort. CV: Nondisplaced PMI.  Heart regular S1/S2, no S3/S4, no murmur.  No peripheral edema.  No carotid bruit.  Normal pedal pulses.  Abdomen: Soft, nontender, no hepatosplenomegaly, no distention.  Skin: Intact without lesions or rashes.  Neurologic: Alert and oriented x 3.  Psych: Normal affect. Extremities: No clubbing or cyanosis.  HEENT: Normal.   ASSESSMENT & PLAN:  1. Breast cancer: Metastatic.  Currently on Fam-trastuzumab deruxtecan (Enhertu).  Echo today shows EF 55-60% but strain imaging suboptimal.  Off losartan and dapagliflozin with soft BP and orthostasis.   - Continue Coreg 6.25 mg bid.  - Given stability in LV function, will repeat echo again in 6 months.  2. HTN: BP controlled.  3. OSA: Continue nightly CPAP   4. CAD: Coronary artery calcium score was elevated, 3653 Agatston units, placing her in the 99th percentile for her age and gender.  This suggests high risk for future cardiac events.  She has no chest pain.  Cardiolite 3/21 with no  ischemia/infarction.  Rise in LFTs with Crestor. Unable to obtain Repatha for her.  - She is now on Nexlizet, good lipids in 9/22.   - Continue ASA 81, beta blocker.  5. CKD stage 3: Suspect diabetic nephropathy.  Last creatinine improved at 1.86.  - Off dapagliflozin with orthostasis/hypotension, resolved.  - I will have her start Kerendia (finerenone) 10 mg daily for renal protection.  BMET today and again in 10 days.  - BMET today.   Followup in 6 months with echo  Loralie Champagne 01/16/2021

## 2021-01-16 NOTE — Progress Notes (Signed)
°  Echocardiogram 2D Echocardiogram has been performed.  Bobbye Charleston 01/16/2021, 9:47 AM

## 2021-01-18 ENCOUNTER — Inpatient Hospital Stay: Payer: Medicare PPO

## 2021-01-18 ENCOUNTER — Other Ambulatory Visit: Payer: Self-pay

## 2021-01-18 ENCOUNTER — Inpatient Hospital Stay: Payer: Medicare PPO | Admitting: Pharmacist

## 2021-01-18 VITALS — BP 117/72 | HR 81 | Temp 97.1°F | Resp 18 | Wt 160.8 lb

## 2021-01-18 DIAGNOSIS — Z9011 Acquired absence of right breast and nipple: Secondary | ICD-10-CM | POA: Diagnosis not present

## 2021-01-18 DIAGNOSIS — C787 Secondary malignant neoplasm of liver and intrahepatic bile duct: Secondary | ICD-10-CM | POA: Diagnosis not present

## 2021-01-18 DIAGNOSIS — C773 Secondary and unspecified malignant neoplasm of axilla and upper limb lymph nodes: Secondary | ICD-10-CM | POA: Diagnosis not present

## 2021-01-18 DIAGNOSIS — C50411 Malignant neoplasm of upper-outer quadrant of right female breast: Secondary | ICD-10-CM

## 2021-01-18 DIAGNOSIS — Z5112 Encounter for antineoplastic immunotherapy: Secondary | ICD-10-CM | POA: Diagnosis not present

## 2021-01-18 DIAGNOSIS — Z923 Personal history of irradiation: Secondary | ICD-10-CM | POA: Diagnosis not present

## 2021-01-18 DIAGNOSIS — Z7189 Other specified counseling: Secondary | ICD-10-CM

## 2021-01-18 DIAGNOSIS — C50811 Malignant neoplasm of overlapping sites of right female breast: Secondary | ICD-10-CM | POA: Diagnosis not present

## 2021-01-18 DIAGNOSIS — Z171 Estrogen receptor negative status [ER-]: Secondary | ICD-10-CM | POA: Diagnosis not present

## 2021-01-18 DIAGNOSIS — Z95828 Presence of other vascular implants and grafts: Secondary | ICD-10-CM

## 2021-01-18 LAB — CBC WITH DIFFERENTIAL (CANCER CENTER ONLY)
Abs Immature Granulocytes: 0.03 10*3/uL (ref 0.00–0.07)
Basophils Absolute: 0 10*3/uL (ref 0.0–0.1)
Basophils Relative: 1 %
Eosinophils Absolute: 0.1 10*3/uL (ref 0.0–0.5)
Eosinophils Relative: 2 %
HCT: 29.1 % — ABNORMAL LOW (ref 36.0–46.0)
Hemoglobin: 9.9 g/dL — ABNORMAL LOW (ref 12.0–15.0)
Immature Granulocytes: 1 %
Lymphocytes Relative: 20 %
Lymphs Abs: 1 10*3/uL (ref 0.7–4.0)
MCH: 34.4 pg — ABNORMAL HIGH (ref 26.0–34.0)
MCHC: 34 g/dL (ref 30.0–36.0)
MCV: 101 fL — ABNORMAL HIGH (ref 80.0–100.0)
Monocytes Absolute: 0.6 10*3/uL (ref 0.1–1.0)
Monocytes Relative: 11 %
Neutro Abs: 3.4 10*3/uL (ref 1.7–7.7)
Neutrophils Relative %: 65 %
Platelet Count: 278 10*3/uL (ref 150–400)
RBC: 2.88 MIL/uL — ABNORMAL LOW (ref 3.87–5.11)
RDW: 15.1 % (ref 11.5–15.5)
WBC Count: 5.2 10*3/uL (ref 4.0–10.5)
nRBC: 0 % (ref 0.0–0.2)

## 2021-01-18 LAB — CMP (CANCER CENTER ONLY)
ALT: 12 U/L (ref 0–44)
AST: 23 U/L (ref 15–41)
Albumin: 3.6 g/dL (ref 3.5–5.0)
Alkaline Phosphatase: 43 U/L (ref 38–126)
Anion gap: 8 (ref 5–15)
BUN: 33 mg/dL — ABNORMAL HIGH (ref 8–23)
CO2: 27 mmol/L (ref 22–32)
Calcium: 9.2 mg/dL (ref 8.9–10.3)
Chloride: 106 mmol/L (ref 98–111)
Creatinine: 2.18 mg/dL — ABNORMAL HIGH (ref 0.44–1.00)
GFR, Estimated: 24 mL/min — ABNORMAL LOW (ref >60)
Glucose, Bld: 122 mg/dL — ABNORMAL HIGH (ref 70–99)
Potassium: 4.4 mmol/L (ref 3.5–5.1)
Sodium: 141 mmol/L (ref 135–145)
Total Bilirubin: 0.4 mg/dL (ref 0.3–1.2)
Total Protein: 6.1 g/dL — ABNORMAL LOW (ref 6.5–8.1)

## 2021-01-18 LAB — CEA (IN HOUSE-CHCC): CEA (CHCC-In House): 2.62 ng/mL (ref 0.00–5.00)

## 2021-01-18 MED ORDER — DIPHENHYDRAMINE HCL 25 MG PO CAPS
25.0000 mg | ORAL_CAPSULE | Freq: Once | ORAL | Status: AC
  Administered 2021-01-18: 14:00:00 25 mg via ORAL
  Filled 2021-01-18: qty 1

## 2021-01-18 MED ORDER — SODIUM CHLORIDE 0.9 % IV SOLN: Freq: Once | INTRAVENOUS | Status: AC

## 2021-01-18 MED ORDER — SODIUM CHLORIDE 0.9% FLUSH
10.0000 mL | INTRAVENOUS | Status: DC | PRN
  Administered 2021-01-18: 16:00:00 10 mL

## 2021-01-18 MED ORDER — PALONOSETRON HCL INJECTION 0.25 MG/5ML
0.2500 mg | Freq: Once | INTRAVENOUS | Status: AC
  Administered 2021-01-18: 14:00:00 0.25 mg via INTRAVENOUS
  Filled 2021-01-18: qty 5

## 2021-01-18 MED ORDER — SODIUM CHLORIDE 0.9% FLUSH
10.0000 mL | Freq: Once | INTRAVENOUS | Status: AC
  Administered 2021-01-18: 13:00:00 10 mL

## 2021-01-18 MED ORDER — SODIUM CHLORIDE 0.9 % IV SOLN
10.0000 mg | Freq: Once | INTRAVENOUS | Status: AC
  Administered 2021-01-18: 14:00:00 10 mg via INTRAVENOUS
  Filled 2021-01-18: qty 10

## 2021-01-18 MED ORDER — ACETAMINOPHEN 325 MG PO TABS
650.0000 mg | ORAL_TABLET | Freq: Once | ORAL | Status: AC
  Administered 2021-01-18: 14:00:00 650 mg via ORAL
  Filled 2021-01-18: qty 2

## 2021-01-18 MED ORDER — DEXTROSE 5 % IV SOLN: Freq: Once | INTRAVENOUS | Status: AC

## 2021-01-18 MED ORDER — FAM-TRASTUZUMAB DERUXTECAN-NXKI CHEMO 100 MG IV SOLR
5.2500 mg/kg | Freq: Once | INTRAVENOUS | Status: AC
  Administered 2021-01-18: 15:00:00 400 mg via INTRAVENOUS
  Filled 2021-01-18: qty 20

## 2021-01-18 MED ORDER — HEPARIN SOD (PORK) LOCK FLUSH 100 UNIT/ML IV SOLN
500.0000 [IU] | Freq: Once | INTRAVENOUS | Status: AC | PRN
  Administered 2021-01-18: 16:00:00 500 [IU]

## 2021-01-18 NOTE — Progress Notes (Signed)
[place]       Telephone: [phone]?Fax: [phone]   Oncology Clinical Pharmacist Practitioner Progress Note  Chaniyah Jahr [facility] was contacted via in-person visit to discuss her chemotherapy regimen for fam-trastuzumab deruxtecan-nxki which they receive under the care of Dr. Nicholas Lose. Ms. Madera was seeing Dr. Jana Hakim but Dr. Lindi Adie will now take over her care as Dr. Jana Hakim has recently retired.  Current treatment regimen and start date Fam-trastuzumab deruxtecan-nxki (10/05/20)  Interval History She continues on fam-trastuzumab deruxtecan-nxki, 400 mg, by IV route. Fam-trastuzumab deruxtecan-nxki is given on day 1 of a 21 day cycle. Therapy is planned to continue until disease progression or unacceptable toxicity. She did have some discussion with Dr. Jana Hakim about possibly coming off of fam-trastuzumab deruxtecan-nxki.  During our discussion today, we went over that usually in the metastatic setting we continue therapy if it is working and there is minimal toxicity as long as the patient is in agreement.    Response to Therapy [name] was seen today in pharmacy clinic prior to cycle 6 of fam-trastuzumab deruxtecan.  She was last seen by Dr. Jana Hakim on December 8.  She will now be followed by Dr. Lindi Adie as Dr. Jana Hakim has recently retired.  She last saw clinical pharmacy on November 17.  Since her last visit, she states that the ondansetron that was recommended is working very well.  She does report one episode of vomiting last night.  She is not sure if it was something she ate but she has not had any vomiting per her report since her last visit.  She does report intermittent constipation.  She believes that the last time she had a bowel movement was approximately 3 to 4 days ago.  Her current bowel regimen consists of MiraLAX daily and 2 tablets of senna S daily.  We discussed possibly going up on the senna S to 2 tablets twice daily until she has a  bowel movement.  We also discussed increasing her fluid intake which will help with constipation but also help with her elevated serum creatinine today.  She was given 1 L of normal saline over an hour today prior to her treatment with fam-trastuzumab deruxtecan today. Nursing was made aware of the incompatibility with normal saline and fam-trastuzumab deruxtecan-nxki. [name] was also given written information on constipation that goes over some foods to avoid and some foods to consider in her diet.  We also discussed trying to remain active.  [name] was instructed to contact the clinic tomorrow afternoon if she still has not had a bowel movement so that other treatment modalities can be explored.  She is having some hair loss.  We discussed that although her hair should not fall out completely as with some traditional chemotherapies, the literature does have alopecia at about 37% for all grades.  We discussed some possible options such as minoxidil and she will discuss this further when she meets with Dr. Lindi Adie next.  This is scheduled for January 19.  She states that she will be starting a new medication called Carrington Clamp that her cardiologist prescribed.this is already been added to her medication list.  Labs, vitals, treatment parameters, and manufacturer guidelines assessing toxicity were reviewed with [name] today.  As discussed above, her serum creatinine was elevated today at 2.18 mg/dL.  We have added 1 L of normal saline to run over an hour prior to her fam-trastuzumab deruxtecan-nxki and encouraged Ms. Burnett to drink plenty of fluids  and monitor her urine output.  She verbalized understanding with this plan.  Based on these values, patient is in agreement to continue therapy at this time.  Allergies Allergies  Allergen Reactions   Nsaids Other (See Comments)    H/o gastric bypass - avoid NSAIDs   Tape Hives, Rash and Other (See Comments)    Adhesives in bandaids     Vitals Vitals with BMI 01/18/2021 01/16/2021 12/28/2020  Height - - '5\' 9"'   Weight 160 lbs 13 oz 159 lbs 163 lbs 2 oz  BMI 42.70 - 62.37  Systolic 628 315 176  Diastolic 72 67 67  Pulse 81 86 73     Laboratory Data CBC EXTENDED Latest Ref Rng & Units 01/18/2021 12/28/2020 12/07/2020  WBC 4.0 - 10.5 K/uL 5.2 3.9(L) 3.9(L)  RBC 3.87 - 5.11 MIL/uL 2.88(L) 2.61(L) 2.74(L)  HGB 12.0 - 15.0 g/dL 9.9(L) 9.0(L) 9.1(L)  HCT 36.0 - 46.0 % 29.1(L) 26.0(L) 27.3(L)  PLT 150 - 400 K/uL 278 258 239  NEUTROABS 1.7 - 7.7 K/uL 3.4 2.4 2.5  LYMPHSABS 0.7 - 4.0 K/uL 1.0 0.7 0.7    CMP Latest Ref Rng & Units 01/18/2021 12/28/2020 12/07/2020  Glucose 70 - 99 mg/dL 122(H) 112(H) 210(H)  BUN 8 - 23 mg/dL 33(H) 32(H) 31(H)  Creatinine 0.44 - 1.00 mg/dL 2.18(H) 1.86(H) 1.69(H)  Sodium 135 - 145 mmol/L 141 144 141  Potassium 3.5 - 5.1 mmol/L 4.4 4.3 4.3  Chloride 98 - 111 mmol/L 106 108 107  CO2 22 - 32 mmol/L '27 25 27  ' Calcium 8.9 - 10.3 mg/dL 9.2 8.6(L) 8.5(L)  Total Protein 6.5 - 8.1 g/dL 6.1(L) 5.7(L) 5.7(L)  Total Bilirubin 0.3 - 1.2 mg/dL 0.4 0.5 0.4  Alkaline Phos 38 - 126 U/L 43 50 58  AST 15 - 41 U/L '23 25 20  ' ALT 0 - 44 U/L '12 12 10   ' No results found for: MG   Adverse Effects Assessment Fever/neutropenia: reviewed the importance of having a thermometer and the Centers for Disease Control and Prevention (CDC) definition of fever which is 100.4F (38C) or higher. Patient should call 24/7 triage at [phone] if experiencing a fever or any other symptoms Constipation: as above, gave recommendations to increase Senna-S until her next bowel movement and drink plenty of fluids.  She will contact clinic if no bowel movement by tomorrow mid-afternoon. Increased serum creatinine: Can be seen in about 15% in the fam-trastuzumab deruxtecan-nxki literature.  Given 1L of NS today prior to her treatment and encouraged to drink more fluids Alopecia: about 37% all grades in the literature. Discussed  possible trial of minoxidil for females. She will discuss further with Dr. Lindi Adie at her next visit  Medication Reconciliation The patient's medication list was reviewed today with the patient? Yes New medications or herbal supplements have recently been started?  Will be started finerenone Carrington Clamp) as discussed above Any medications have been discontinued? No  The medication list was updated and reconciled based on the patient's most recent medication list in the electronic medical record (EMR) including herbal products and OTC medications.   Medications Current Outpatient Medications  Medication Sig Dispense Refill   Accu-Chek Softclix Lancets lancets 1 each by Other route 4 (four) times daily. Test blood sugars 4 x times daily 200 each 1   aspirin EC 81 MG tablet Take 1 tablet (81 mg total) by mouth daily. 90 tablet 3   Blood Glucose Calibration (ACCU-CHEK GUIDE CONTROL) LIQD 1 Bottle by In Vitro route  as needed. 1 each 0   Blood Glucose Monitoring Suppl (ACCU-CHEK GUIDE) w/Device KIT 1 each by Does not apply route 4 (four) times daily. Use to test blood sugars 4x a day 1 kit 0   buPROPion (WELLBUTRIN SR) 200 MG 12 hr tablet Take one tablet by mouth once daily. 90 tablet 0   CALCIUM CITRATE-VITAMIN D PO Take 1 tablet by mouth 3 (three) times daily.      carvedilol (COREG) 6.25 MG tablet Take 1 tablet (6.25 mg total) by mouth 2 (two) times daily. 180 tablet 3   cholecalciferol (VITAMIN D) 1000 units tablet Take 1,000 Units by mouth daily.     Cyanocobalamin (VITAMIN B-12 PO) Take 1 tablet by mouth every 14 (fourteen) days.     dexamethasone (DECADRON) 4 MG tablet Take 2 tablets (8 mg total) by mouth daily. Start the day after chemotherapy for 2 days. 30 tablet 1   diphenhydrAMINE (BENADRYL) 25 MG tablet Take 25 mg by mouth daily as needed for allergies or sleep.     Finerenone (KERENDIA) 10 MG TABS Take 10 mg by mouth daily. 30 tablet 6   glucose blood (ACCU-CHEK GUIDE) test strip Use Accu  Chek test strips to check blood sugar 4 times daily. DX:E11.21 400 each 0   insulin aspart (NOVOLOG) 100 UNIT/ML injection Inject into the skin daily as needed for high blood sugar. Per patient, sliding scale with meals as needed     insulin glargine (LANTUS SOLOSTAR) 100 UNIT/ML Solostar Pen Inject 8 Units into the skin daily. 7 mL 3   loratadine (CLARITIN) 10 MG tablet Take 10 mg by mouth as needed for allergies.     melatonin 5 MG TABS Take 5 mg by mouth at bedtime.     NEXLIZET 180-10 MG TABS TAKE 1 TABLET BY MOUTH DAILY. 30 tablet 11   omeprazole (PRILOSEC) 40 MG capsule Take 1 capsule (40 mg total) by mouth daily. 30 capsule 1   ondansetron (ZOFRAN) 8 MG tablet Take 1 tablet (8 mg total) by mouth every 8 (eight) hours as needed for nausea or vomiting. Start on day 3 after chemo. 30 tablet 2   Pediatric Multivitamins-Iron (FLINTSTONES PLUS IRON) chewable tablet Chew 1 tablet by mouth 2 (two) times daily.     Probiotic Product (CVS PROBIOTIC PO) Take 1 capsule by mouth daily.      prochlorperazine (COMPAZINE) 10 MG tablet Take 1 tablet (10 mg total) by mouth every 6 (six) hours as needed (Nausea or vomiting). 30 tablet 1   Propylene Glycol (SYSTANE BALANCE OP) Place 1-2 drops into both eyes 3 (three) times daily as needed (for dry eyes).      senna-docusate (SENOKOT-S) 8.6-50 MG tablet Take 1 tablet by mouth daily as needed for moderate constipation.     SURE COMFORT PEN NEEDLES 32G X 4 MM MISC Use as instructed to inject insulin and Victoza daily. 200 each 0   tobramycin (TOBREX) 0.3 % ophthalmic ointment Place 1 application into both eyes as needed (Takes day before eye injections).     vortioxetine HBr (TRINTELLIX) 20 MG TABS tablet Take 1 tablet (20 mg total) by mouth at bedtime. 90 tablet 0   No current facility-administered medications for this visit.   Facility-Administered Medications Ordered in Other Visits  Medication Dose Route Frequency Provider Last Rate Last Admin   0.9 %   sodium chloride infusion   Intravenous Once Nicholas Lose, MD 999 mL/hr at 01/18/21 1353 New Bag at 01/18/21 1353  dexamethasone (DECADRON) 10 mg in sodium chloride 0.9 % 50 mL IVPB  10 mg Intravenous Once Magrinat, Virgie Dad, MD 204 mL/hr at 01/18/21 1421 10 mg at 01/18/21 1421   fam-trastuzumab deruxtecan-nxki (ENHERTU) 400 mg in dextrose 5 % 100 mL chemo infusion  5.25 mg/kg (Treatment Plan Recorded) Intravenous Once Magrinat, Virgie Dad, MD       heparin lock flush 100 unit/mL  500 Units Intracatheter Once PRN Magrinat, Virgie Dad, MD       sodium chloride flush (NS) 0.9 % injection 10 mL  10 mL Intracatheter PRN Magrinat, Virgie Dad, MD        Drug-Drug Interactions (DDIs) DDIs were evaluated? Yes Significant DDIs? No  The patient was instructed to speak with their health care provider and/or the oral chemotherapy pharmacist before starting any new drug, including prescription or over the counter, natural / herbal products, or vitamins.  Supportive Care Kidney function: discussed making sure well hydrated. She does have known CKD and T2DM Constipation: went over bowel regimen to consider Alopecia: will consider minoxidil  Dosing Assessment Hepatic adjustments needed? No  Renal adjustments needed?  No, given 1L of NS and will monitor. May hold treatment in future if kidney function worsens although nothing specific in the manufacturer guidelines for dose decreases Toxicity adjustments needed? No  The current dosing regimen is appropriate to continue at this time.  Follow-Up Plan Port flush w/labs / Dr. Lindi Adie visit / [facility] on 02/08/21 if Ms. Sykora wants to continue. Have added an additional cycle proactively Port flush w/labs / Pharmacy clinic visit / Enhertu on 03/01/21 if continuing  Nashali Ditmer participated in the discussion, expressed understanding, and voiced agreement with the above plan. All questions were answered to her satisfaction. The patient was advised to contact  the clinic at [phone] with any questions or concerns prior to her return visit.   I spent 30 minutes assessing and educating the patient.  Raina Mina, RPH-CPP, 01/18/2021  2:27 PM

## 2021-01-18 NOTE — Progress Notes (Signed)
Ok to give premeds during normal saline bolus per [facility]

## 2021-01-18 NOTE — Patient Instructions (Signed)
[facility] MEDICAL ONCOLOGY  Discharge Instructions: ?Thank you for choosing [facility] to provide your oncology and hematology care.  ? ?If you have a lab appointment with the Cancer Center, please go directly to the Cancer Center and check in at the registration area. ?  ?Wear comfortable clothing and clothing appropriate for easy access to any Portacath or PICC line.  ? ?We strive to give you quality time with your provider. You may need to reschedule your appointment if you arrive late (15 or more minutes).  Arriving late affects you and other patients whose appointments are after yours.  Also, if you miss three or more appointments without notifying the office, you may be dismissed from the clinic at the provider?s discretion.    ?  ?For prescription refill requests, have your pharmacy contact our office and allow 72 hours for refills to be completed.   ? ?Today you received the following chemotherapy and/or immunotherapy agents: Enhertu.    ?  ?To help prevent nausea and vomiting after your treatment, we encourage you to take your nausea medication as directed. ? ?BELOW ARE SYMPTOMS THAT SHOULD BE REPORTED IMMEDIATELY: ?*FEVER GREATER THAN 100.4 F (38 ?C) OR HIGHER ?*CHILLS OR SWEATING ?*NAUSEA AND VOMITING THAT IS NOT CONTROLLED WITH YOUR NAUSEA MEDICATION ?*UNUSUAL SHORTNESS OF BREATH ?*UNUSUAL BRUISING OR BLEEDING ?*URINARY PROBLEMS (pain or burning when urinating, or frequent urination) ?*BOWEL PROBLEMS (unusual diarrhea, constipation, pain near the anus) ?TENDERNESS IN MOUTH AND THROAT WITH OR WITHOUT PRESENCE OF ULCERS (sore throat, sores in mouth, or a toothache) ?UNUSUAL RASH, SWELLING OR PAIN  ?UNUSUAL VAGINAL DISCHARGE OR ITCHING  ? ?Items with * indicate a potential emergency and should be followed up as soon as possible or go to the Emergency Department if any problems should occur. ? ?Please show the CHEMOTHERAPY ALERT CARD or IMMUNOTHERAPY ALERT CARD at check-in to  the Emergency Department and triage nurse. ? ?Should you have questions after your visit or need to cancel or reschedule your appointment, please contact [facility] MEDICAL [facility]COL[facility]  Dept: [phone]  and follow the prompts.  Office hours are 8:00 a.m. to 4:30 p.m. Monday - Friday. Please note that voicemails left after 4:00 p.m. may not be returned until the following business day.  We are closed weekends and major holidays. You have access to a nurse at all times for urgent questions. Please call the main number to the clinic Dept: [phone] and follow the prompts. ? ? ?For any non-urgent questions, you may also contact your provider using MyChart. We now offer e-Visits for anyone 18 and older to request care online for non-urgent symptoms. For details visit [email or web address]. ?  ?Also download the MyChart app! Go to the app store, search "MyChart", open the app, select , and log in with your MyChart username and password. ? ?Due to Covid, a mask is required upon entering the hospital/clinic. If you do not have a mask, one will be given to you upon arrival. For doctor visits, patients may have 1 support person aged 18 or older with them. For treatment visits, patients cannot have anyone with them due to current Covid guidelines and our immunocompromised population.  ? ?

## 2021-01-19 ENCOUNTER — Telehealth: Payer: Self-pay | Admitting: *Deleted

## 2021-01-23 ENCOUNTER — Ambulatory Visit: Payer: Medicare PPO | Admitting: Podiatry

## 2021-01-24 ENCOUNTER — Other Ambulatory Visit: Payer: Self-pay | Admitting: Oncology

## 2021-01-24 ENCOUNTER — Telehealth (HOSPITAL_COMMUNITY): Payer: Self-pay | Admitting: Pharmacist

## 2021-01-24 ENCOUNTER — Other Ambulatory Visit (HOSPITAL_COMMUNITY): Payer: Self-pay

## 2021-01-24 NOTE — Telephone Encounter (Signed)
Advanced Heart Failure Patient Advocate Encounter  Prior Authorization for Carrington Clamp has been approved.    PA# [id number] Effective dates: 01/21/21 through 01/20/22  Patients co-pay is $40.00  Audry Riles, PharmD, BCPS, BCCP, CPP Heart Failure Clinic Pharmacist [phone]'

## 2021-01-30 ENCOUNTER — Ambulatory Visit (HOSPITAL_COMMUNITY)
Admission: RE | Admit: 2021-01-30 | Discharge: 2021-01-30 | Disposition: A | Discharge status: Home / Self Care | Payer: Medicare PPO | Source: Ambulatory Visit | Attending: Cardiology | Admitting: Cardiology

## 2021-01-30 ENCOUNTER — Other Ambulatory Visit: Payer: Self-pay

## 2021-01-30 DIAGNOSIS — N184 Chronic kidney disease, stage 4 (severe): Secondary | ICD-10-CM | POA: Diagnosis not present

## 2021-01-30 LAB — BASIC METABOLIC PANEL
Anion gap: 11 (ref 5–15)
BUN: 33 mg/dL — ABNORMAL HIGH (ref 8–23)
CO2: 26 mmol/L (ref 22–32)
Calcium: 9.6 mg/dL (ref 8.9–10.3)
Chloride: 104 mmol/L (ref 98–111)
Creatinine, Ser: 2.12 mg/dL — ABNORMAL HIGH (ref 0.44–1.00)
GFR, Estimated: 25 mL/min — ABNORMAL LOW (ref >60)
Glucose, Bld: 142 mg/dL — ABNORMAL HIGH (ref 70–99)
Potassium: 4 mmol/L (ref 3.5–5.1)
Sodium: 141 mmol/L (ref 135–145)

## 2021-02-02 ENCOUNTER — Other Ambulatory Visit: Payer: Self-pay

## 2021-02-02 ENCOUNTER — Ambulatory Visit (HOSPITAL_COMMUNITY)
Admission: RE | Admit: 2021-02-02 | Discharge: 2021-02-02 | Disposition: A | Discharge status: Home / Self Care | Payer: Medicare PPO | Source: Ambulatory Visit | Attending: Hematology and Oncology | Admitting: Hematology and Oncology

## 2021-02-02 DIAGNOSIS — Z8505 Personal history of malignant neoplasm of liver: Secondary | ICD-10-CM | POA: Diagnosis not present

## 2021-02-02 DIAGNOSIS — K769 Liver disease, unspecified: Secondary | ICD-10-CM | POA: Diagnosis not present

## 2021-02-02 LAB — GLUCOSE, CAPILLARY: Glucose-Capillary: 103 mg/dL — ABNORMAL HIGH (ref 70–99)

## 2021-02-02 MED ORDER — FLUDEOXYGLUCOSE F - 18 (FDG) INJECTION
8.0000 | Freq: Once | INTRAVENOUS | Status: AC
  Administered 2021-02-02: 7.97 via INTRAVENOUS

## 2021-02-06 ENCOUNTER — Other Ambulatory Visit: Payer: Self-pay

## 2021-02-06 DIAGNOSIS — C50411 Malignant neoplasm of upper-outer quadrant of right female breast: Secondary | ICD-10-CM

## 2021-02-07 ENCOUNTER — Encounter: Payer: Self-pay | Admitting: Hematology and Oncology

## 2021-02-07 DIAGNOSIS — E1165 Type 2 diabetes mellitus with hyperglycemia: Secondary | ICD-10-CM | POA: Diagnosis not present

## 2021-02-07 NOTE — Progress Notes (Signed)
Patient Care Team: Isaac Bliss, Rayford Halsted, MD as PCP - General (Internal Medicine) Larey Dresser, MD as PCP - Advanced Heart Failure (Cardiology) Stark Klein, MD as Consulting Physician (General Surgery) Jacelyn Pi, MD as Consulting Physician (Endocrinology) [name], MD as Consulting Physician (Plastic Surgery) Gery Pray, MD as Consulting Physician (Radiation Oncology) Juanita Craver, MD as Consulting Physician (Gastroenterology) Alda Berthold, DO as Consulting Physician (Neurology) Starlyn Skeans, MD as Consulting Physician (Family Medicine) Sherlynn Stalls, MD as Consulting Physician (Ophthalmology) Salvadore Dom, MD as Consulting Physician (Obstetrics and Gynecology) Raina Mina, RPH-CPP (Pharmacist) Nicholas Lose, MD as Consulting Physician (Hematology and Oncology)  DIAGNOSIS:    ICD-10-CM   1. Malignant neoplasm of upper-outer quadrant of right breast in female, estrogen receptor negative ([place])  C50.411    Z17.1       SUMMARY OF ONCOLOGIC HISTORY: Oncology History  Breast cancer of upper-outer quadrant of right female breast ([place])  07/19/2014 Initial Biopsy   (R) breast needle biopsy (10:00): IDC, DCIS, grade 2. ER-, PR-, HER2+ (ratio 2.42). Ki67 90%.    07/22/2014 Initial Diagnosis   Breast cancer of upper-outer quadrant of right female breast   08/08/2014 - 11/21/2014 Neo-Adjuvant Chemotherapy   Taxotere/Carbo/Herceptin/Perjeta x 6 cycles.    12/12/2014 - 10/02/2015 Chemotherapy   Maintenance Herceptin (to complete 1 year of therapy).    12/27/2014 Surgery   (R) lumpectomy with SL[facility]): IDC, grade 2, spanning 4 cm, associated high grade DCIS. Negative margins. 1/3 SLN (+).   ER/PR repeated and remain negative.   ypT2, ypN1a: Stage IIB   01/06/2015 Surgery   Bilateral breast mammoplasty ([place]): Right breast path with intralymphatic emboli of ductal carcinoma. Left breast benign.    02/02/2015 Imaging   CT chest: No  findings of metastatic disease in the chest. Right axillary fluid density lesion likely postoperative seroma or hematoma. Small left subpectoral nodes warrant followup attention.   02/02/2015 Imaging   Bone scan: No scintigraphic evidence of osseous metastatic disease   02/16/2015 Surgery   (R) mastectomy and ALND [facility]): Scattered foci of high grade DCIS, scattered intralymphatic tumor emboli. Margins negative. 0/15 LNs.    04/05/2015 - 05/19/2015 Radiation Therapy   Adjuvant XRT ([place]). The right chest wall was treated to 50.4 Gy in 28 fractions at 1.8 Gy per fraction.  The right mastectomy scar was treated to 10 Gy in 5 fractions at 2 Gy per fraction.    12/06/2015 PET scan   Right subpectoral lymph node measuring 9 mm and exhibits malignant range FDG uptake, suspicious for metastatic adenopathy. Subcutaneous nodule within the upper left chest wall exhibits malignant range uptake, which is indeterminate; this is at the site of previous Port-A-Cath and is favored to represent postsurgical change. Indeterminate left adrenal nodules which exhibit mild uptake, favored to represent benign adenoma.   08/11/2017 - 11/03/2017 Chemotherapy   The patient had ado-trastuzumab emtansine (KADCYLA) 320 mg in sodium chloride 0.9 % 250 mL chemo infusion, 3.6 mg/kg = 320 mg, Intravenous, Once, 4 of 5 cycles Administration: 320 mg (08/12/2017), 320 mg (09/01/2017), 320 mg (09/23/2017)   for chemotherapy treatment.     11/18/2017 - 08/30/2020 Chemotherapy   The patient had trastuzumab (HERCEPTIN) 546 mg in sodium chloride 0.9 % 250 mL chemo infusion, 6 mg/kg = 546 mg (100 % of original dose 6 mg/kg), Intravenous,  Once, 2 of 2 cycles Dose modification: 6 mg/kg (original dose 6 mg/kg, Cycle 1, Reason: Provider Judgment) Administration: 546 mg (11/18/2017), 546  mg (12/16/2017) pertuzumab (PERJETA) 420 mg in sodium chloride 0.9 % 250 mL chemo infusion, 420 mg (100 % of original dose 420 mg), Intravenous, Once, 15 of  15 cycles Dose modification: 420 mg (original dose 420 mg, Cycle 18), 420 mg (original dose 420 mg, Cycle 27) Administration: 420 mg (10/06/2018), 420 mg (10/27/2018), 420 mg (11/18/2018), 420 mg (12/08/2018), 420 mg (12/29/2018), 420 mg (01/19/2019), 420 mg (02/16/2019), 420 mg (03/16/2019), 420 mg (04/13/2019), 420 mg (06/08/2019), 420 mg (07/06/2019), 420 mg (08/03/2019), 420 mg (08/31/2019), 420 mg (09/28/2019), 420 mg (05/19/2019) trastuzumab-anns (KANJINTI) 546 mg in sodium chloride 0.9 % 250 mL chemo infusion, 6 mg/kg = 546 mg (100 % of original dose 6 mg/kg), Intravenous,  Once, 30 of 30 cycles Dose modification: 6 mg/kg (original dose 6 mg/kg, Cycle 3, Reason: Other (see comments), Comment: requested step therapy by [facility] insurance), 4 mg/kg (original dose 6 mg/kg, Cycle 9, Reason: Provider Judgment), 6 mg/kg (original dose 6 mg/kg, Cycle 17, Reason: Provider Judgment), 4 mg/kg (original dose 6 mg/kg, Cycle 27, Reason: Provider Judgment), 6 mg/kg (original dose 6 mg/kg, Cycle 27, Reason: Provider Judgment) Administration: 546 mg (01/13/2018), 546 mg (02/10/2018), 546 mg (03/03/2018), 546 mg (03/24/2018), 546 mg (04/14/2018), 546 mg (05/05/2018), 357 mg (05/26/2018), 357 mg (06/09/2018), 357 mg (06/23/2018), 357 mg (07/07/2018), 357 mg (07/21/2018), 357 mg (08/04/2018), 357 mg (08/18/2018), 357 mg (09/01/2018), 546 mg (09/15/2018), 546 mg (10/06/2018), 546 mg (10/27/2018), 546 mg (11/18/2018), 546 mg (12/08/2018), 546 mg (12/29/2018), 546 mg (01/19/2019), 546 mg (02/16/2019), 480 mg (03/16/2019), 483 mg (04/13/2019), 450 mg (06/08/2019), 450 mg (07/06/2019), 450 mg (08/03/2019), 450 mg (08/31/2019), 450 mg (09/28/2019)   for chemotherapy treatment.     12/31/2017 - 09/01/2018 Chemotherapy   The patient had PACLitaxel-protein bound (ABRAXANE) chemo infusion 175 mg, 80 mg/m2 = 175 mg (100 % of original dose 80 mg/m2), Intravenous,  Once, 5 of 10 cycles Dose modification: 80 mg/m2 (original dose 80 mg/m2, Cycle 11) Administration: 175 mg  (07/07/2018), 175 mg (07/21/2018), 175 mg (08/04/2018), 175 mg (08/18/2018), 175 mg (09/01/2018) PACLitaxel-protein bound (ABRAXANE) chemo infusion 200 mg, 100 mg/m2 = 200 mg, Intravenous, Once, 10 of 10 cycles Dose modification: 80 mg/m2 (original dose 100 mg/m2, Cycle 2, Reason: Provider Judgment), 80 mg/m2 (original dose 100 mg/m2, Cycle 2, Reason: Provider Judgment) Administration: 200 mg (12/31/2017), 200 mg (01/06/2018), 175 mg (01/20/2018), 175 mg (01/27/2018), 175 mg (02/10/2018), 175 mg (03/03/2018), 175 mg (03/10/2018), 175 mg (03/24/2018), 175 mg (04/07/2018), 175 mg (04/14/2018), 175 mg (04/28/2018), 175 mg (05/05/2018), 175 mg (05/26/2018), 175 mg (06/09/2018), 175 mg (05/19/2018)   for chemotherapy treatment.     10/05/2020 -  Chemotherapy   Patient is on Treatment Plan : BREAST METASTATIC fam-trastuzumab deruxtecan-nxki (Enhertu) q21d     Metastasis to liver ([place])  08/04/2017 Initial Diagnosis   Metastasis to liver ([place])   08/12/2017 - 10/14/2017 Chemotherapy   The patient had ado-trastuzumab emtansine (KADCYLA) 320 mg in sodium chloride 0.9 % 250 mL chemo infusion, 3.6 mg/kg = 320 mg, Intravenous, Once, 4 of 5 cycles Administration: 320 mg (08/12/2017), 320 mg (09/01/2017), 320 mg (09/23/2017)   for chemotherapy treatment.     11/18/2017 - 08/30/2020 Chemotherapy   The patient had trastuzumab (HERCEPTIN) 546 mg in sodium chloride 0.9 % 250 mL chemo infusion, 6 mg/kg = 546 mg (100 % of original dose 6 mg/kg), Intravenous,  Once, 2 of 2 cycles Dose modification: 6 mg/kg (original dose 6 mg/kg, Cycle 1, Reason: Provider Judgment) Administration: 546 mg (11/18/2017), 546 mg (12/16/2017)  pertuzumab (PERJETA) 420 mg in sodium chloride 0.9 % 250 mL chemo infusion, 420 mg (100 % of original dose 420 mg), Intravenous, Once, 15 of 15 cycles Dose modification: 420 mg (original dose 420 mg, Cycle 18), 420 mg (original dose 420 mg, Cycle 27) Administration: 420 mg (10/06/2018), 420 mg (10/27/2018), 420 mg  (11/18/2018), 420 mg (12/08/2018), 420 mg (12/29/2018), 420 mg (01/19/2019), 420 mg (02/16/2019), 420 mg (03/16/2019), 420 mg (04/13/2019), 420 mg (06/08/2019), 420 mg (07/06/2019), 420 mg (08/03/2019), 420 mg (08/31/2019), 420 mg (09/28/2019), 420 mg (05/19/2019) trastuzumab-anns (KANJINTI) 546 mg in sodium chloride 0.9 % 250 mL chemo infusion, 6 mg/kg = 546 mg (100 % of original dose 6 mg/kg), Intravenous,  Once, 30 of 30 cycles Dose modification: 6 mg/kg (original dose 6 mg/kg, Cycle 3, Reason: Other (see comments), Comment: requested step therapy by [facility] insurance), 4 mg/kg (original dose 6 mg/kg, Cycle 9, Reason: Provider Judgment), 6 mg/kg (original dose 6 mg/kg, Cycle 17, Reason: Provider Judgment), 4 mg/kg (original dose 6 mg/kg, Cycle 27, Reason: Provider Judgment), 6 mg/kg (original dose 6 mg/kg, Cycle 27, Reason: Provider Judgment) Administration: 546 mg (01/13/2018), 546 mg (02/10/2018), 546 mg (03/03/2018), 546 mg (03/24/2018), 546 mg (04/14/2018), 546 mg (05/05/2018), 357 mg (05/26/2018), 357 mg (06/09/2018), 357 mg (06/23/2018), 357 mg (07/07/2018), 357 mg (07/21/2018), 357 mg (08/04/2018), 357 mg (08/18/2018), 357 mg (09/01/2018), 546 mg (09/15/2018), 546 mg (10/06/2018), 546 mg (10/27/2018), 546 mg (11/18/2018), 546 mg (12/08/2018), 546 mg (12/29/2018), 546 mg (01/19/2019), 546 mg (02/16/2019), 480 mg (03/16/2019), 483 mg (04/13/2019), 450 mg (06/08/2019), 450 mg (07/06/2019), 450 mg (08/03/2019), 450 mg (08/31/2019), 450 mg (09/28/2019)   for chemotherapy treatment.       CHIEF COMPLIANT: Follow-up for metastatic breast cancer status post PET CT scan, previous treatment Enhertu, establishing oncology care  INTERVAL HISTORY: Darlene [name] is a 71 y.o. with above-mentioned history of HER-2 positive, estrogen receptor negative breast cancer, currently on chemotherapy with Enhertu. She presents to the clinic today for treatment.  She had a PET CT scan which showed remarkable improvement in hypermetabolic activity in the  liver as well as ascites.  She has had difficulty with Enhertu with profound fatigue and nausea hair loss.  ALLERGIES:  is allergic to nsaids, peanut oil, and tape.  MEDICATIONS:  Current Outpatient Medications  Medication Sig Dispense Refill   Accu-Chek Softclix Lancets lancets 1 each by Other route 4 (four) times daily. Test blood sugars 4 x times daily 200 each 1   aspirin EC 81 MG tablet Take 1 tablet (81 mg total) by mouth daily. 90 tablet 3   Blood Glucose Calibration (ACCU-CHEK GUIDE CONTROL) LIQD 1 Bottle by In Vitro route as needed. 1 each 0   Blood Glucose Monitoring Suppl (ACCU-CHEK GUIDE) w/Device KIT 1 each by Does not apply route 4 (four) times daily. Use to test blood sugars 4x a day 1 kit 0   buPROPion (WELLBUTRIN SR) 200 MG 12 hr tablet Take one tablet by mouth once daily. 90 tablet 0   CALCIUM CITRATE-VITAMIN D PO Take 1 tablet by mouth 3 (three) times daily.      carvedilol (COREG) 6.25 MG tablet Take 1 tablet (6.25 mg total) by mouth 2 (two) times daily. 180 tablet 3   cholecalciferol (VITAMIN D) 1000 units tablet Take 1,000 Units by mouth daily.     Cyanocobalamin (VITAMIN B-12 PO) Take 1 tablet by mouth every 14 (fourteen) days.     dexamethasone (DECADRON) 4 MG tablet Take 2 tablets (8 mg  total) by mouth daily. Start the day after chemotherapy for 2 days. 30 tablet 1   diphenhydrAMINE (BENADRYL) 25 MG tablet Take 25 mg by mouth daily as needed for allergies or sleep.     Finerenone (KERENDIA) 10 MG TABS Take 10 mg by mouth daily. 30 tablet 6   glucose blood (ACCU-CHEK GUIDE) test strip Use Accu Chek test strips to check blood sugar 4 times daily. DX:E11.21 400 each 0   insulin aspart (NOVOLOG) 100 UNIT/ML injection Inject into the skin daily as needed for high blood sugar. Per patient, sliding scale with meals as needed     insulin glargine (LANTUS SOLOSTAR) 100 UNIT/ML Solostar Pen Inject 8 Units into the skin daily. 7 mL 3   loratadine (CLARITIN) 10 MG tablet Take 10 mg  by mouth as needed for allergies.     melatonin 5 MG TABS Take 5 mg by mouth at bedtime.     NEXLIZET 180-10 MG TABS TAKE 1 TABLET BY MOUTH DAILY. 30 tablet 11   omeprazole (PRILOSEC) 40 MG capsule Take 1 capsule (40 mg total) by mouth daily. 30 capsule 1   ondansetron (ZOFRAN) 8 MG tablet Take 1 tablet (8 mg total) by mouth every 8 (eight) hours as needed for nausea or vomiting. Start on day 3 after chemo. 30 tablet 2   Pediatric Multivitamins-Iron (FLINTSTONES PLUS IRON) chewable tablet Chew 1 tablet by mouth 2 (two) times daily.     Probiotic Product ([employer] PROBIOTIC PO) Take 1 capsule by mouth daily.      prochlorperazine (COMPAZINE) 10 MG tablet Take 1 tablet (10 mg total) by mouth every 6 (six) hours as needed (Nausea or vomiting). 30 tablet 1   Propylene Glycol (SYSTANE BALANCE OP) Place 1-2 drops into both eyes 3 (three) times daily as needed (for dry eyes).      senna-docusate (SENOKOT-S) 8.6-50 MG tablet Take 1 tablet by mouth daily as needed for moderate constipation.     SURE COMFORT PEN NEEDLES 32G X 4 MM MISC Use as instructed to inject insulin and Victoza daily. 200 each 0   tobramycin (TOBREX) 0.3 % ophthalmic ointment Place 1 application into both eyes as needed (Takes day before eye injections).     vortioxetine HBr (TRINTELLIX) 20 MG TABS tablet Take 1 tablet (20 mg total) by mouth at bedtime. 90 tablet 0   No current facility-administered medications for this visit.    PHYSICAL EXAMINATION: ECOG PERFORMANCE STATUS: 1 - Symptomatic but completely ambulatory  Vitals:   02/08/21 1443  BP: (!) 126/58  Pulse: 60  Resp: 18  Temp: 97.9 F (36.6 C)  SpO2: 99%   Filed Weights   02/08/21 1443  Weight: 164 lb 3.2 oz (74.5 kg)      LABORATORY DATA:  I have reviewed the data as listed CMP Latest Ref Rng & Units 01/30/2021 01/18/2021 12/28/2020  Glucose 70 - 99 mg/dL 142(H) 122(H) 112(H)  BUN 8 - 23 mg/dL 33(H) 33(H) 32(H)  Creatinine 0.44 - 1.00 mg/dL 2.12(H) 2.18(H)  1.86(H)  Sodium 135 - 145 mmol/L 141 141 144  Potassium 3.5 - 5.1 mmol/L 4.0 4.4 4.3  Chloride 98 - 111 mmol/L 104 106 108  CO2 22 - 32 mmol/L _0 Calcium 8.9 - 10.3 mg/dL 9.6 9.2 8.6(L)  Total Protein 6.5 - 8.1 g/dL - 6.1(L) 5.7(L)  Total Bilirubin 0.3 - 1.2 mg/dL - 0.4 0.5  Alkaline Phos 38 - 126 U/L - 43 50  AST 15 - 41 U/L -  23 25  ALT 0 - 44 U/L - 12 12    Lab Results  Component Value Date   WBC 4.2 02/08/2021   HGB 9.7 (L) 02/08/2021   HCT 29.0 (L) 02/08/2021   MCV 102.8 (H) 02/08/2021   PLT 226 02/08/2021   NEUTROABS 2.4 02/08/2021    ASSESSMENT & PLAN:  Breast cancer of upper-outer quadrant of right female breast ([place]) 07/19/2014: Right breast cancer grade 2 IDC ER/PR negative HER2 positive Ki-67 90% treated with neoadjuvant chemo with TCHP x6 cycles followed by Herceptin maintenance, right lumpectomy had residual disease 4 cm 1/3 lymph node positive, ER/PR negative, adjuvant radiation and capecitabine completed 09/27/2015 12/06/2015: Recurrence: Right subpectoral lymph node and subcutaneous nodule, liver mass (biopsy adenocarcinoma consistent with upper GI or pancreaticobiliary primary, HER2 negative, ER negative) July 2019: Metastatic disease: TDM 1 followed by Herceptin maintenance, HER2 Zenalb added 10/06/2018, Abraxane 12/31/2017-09/01/2018, Herceptin and pertuzumab maintenance  Current treatment: Enhertu started 10/05/2020  Enhertu toxicities: Hair loss, cytopenias especially anemia    The plan was to switch her to Herceptin maintenance.   02/04/2021: PET CT scan: Complete metabolic response, no evidence of hypermetabolic disease in the liver (mass measured 2.7 cm previously is now 1.5 cm with no uptake)  Return to clinic every 4 weeks for Herceptin and every 8 weeks to follow-up with me with labs.  No orders of the defined types were placed in this encounter.  The patient has a good understanding of the overall plan. she agrees with it. she will call with any  problems that may develop before the next visit here.  Total time spent: 45 mins including face to face time and time spent for planning, charting and coordination of care  Rulon Eisenmenger, MD, MPH 02/08/2021  I, Thana Ates, am acting as scribe for Dr. Nicholas Lose.  I have reviewed the above documentation for accuracy and completeness, and I agree with the above.

## 2021-02-08 ENCOUNTER — Inpatient Hospital Stay: Payer: Medicare PPO | Attending: Oncology | Admitting: Hematology and Oncology

## 2021-02-08 ENCOUNTER — Other Ambulatory Visit: Payer: Self-pay

## 2021-02-08 ENCOUNTER — Other Ambulatory Visit: Payer: Medicare PPO

## 2021-02-08 ENCOUNTER — Inpatient Hospital Stay: Payer: Medicare PPO

## 2021-02-08 VITALS — BP 126/58 | HR 60 | Temp 97.9°F | Resp 18 | Ht 69.0 in | Wt 164.2 lb

## 2021-02-08 DIAGNOSIS — C50411 Malignant neoplasm of upper-outer quadrant of right female breast: Secondary | ICD-10-CM

## 2021-02-08 DIAGNOSIS — C50811 Malignant neoplasm of overlapping sites of right female breast: Secondary | ICD-10-CM | POA: Diagnosis not present

## 2021-02-08 DIAGNOSIS — Z5112 Encounter for antineoplastic immunotherapy: Secondary | ICD-10-CM | POA: Insufficient documentation

## 2021-02-08 DIAGNOSIS — C787 Secondary malignant neoplasm of liver and intrahepatic bile duct: Secondary | ICD-10-CM | POA: Insufficient documentation

## 2021-02-08 DIAGNOSIS — Z171 Estrogen receptor negative status [ER-]: Secondary | ICD-10-CM | POA: Diagnosis not present

## 2021-02-08 DIAGNOSIS — Z7189 Other specified counseling: Secondary | ICD-10-CM | POA: Diagnosis not present

## 2021-02-08 DIAGNOSIS — D649 Anemia, unspecified: Secondary | ICD-10-CM | POA: Diagnosis not present

## 2021-02-08 DIAGNOSIS — Z95828 Presence of other vascular implants and grafts: Secondary | ICD-10-CM

## 2021-02-08 LAB — CMP (CANCER CENTER ONLY)
ALT: 10 U/L (ref 0–44)
AST: 21 U/L (ref 15–41)
Albumin: 3.5 g/dL (ref 3.5–5.0)
Alkaline Phosphatase: 40 U/L (ref 38–126)
Anion gap: 5 (ref 5–15)
BUN: 42 mg/dL — ABNORMAL HIGH (ref 8–23)
CO2: 27 mmol/L (ref 22–32)
Calcium: 8.9 mg/dL (ref 8.9–10.3)
Chloride: 107 mmol/L (ref 98–111)
Creatinine: 1.78 mg/dL — ABNORMAL HIGH (ref 0.44–1.00)
GFR, Estimated: 31 mL/min — ABNORMAL LOW (ref >60)
Glucose, Bld: 135 mg/dL — ABNORMAL HIGH (ref 70–99)
Potassium: 4.3 mmol/L (ref 3.5–5.1)
Sodium: 139 mmol/L (ref 135–145)
Total Bilirubin: 0.3 mg/dL (ref 0.3–1.2)
Total Protein: 5.9 g/dL — ABNORMAL LOW (ref 6.5–8.1)

## 2021-02-08 LAB — CBC WITH DIFFERENTIAL (CANCER CENTER ONLY)
Abs Immature Granulocytes: 0.02 10*3/uL (ref 0.00–0.07)
Basophils Absolute: 0 10*3/uL (ref 0.0–0.1)
Basophils Relative: 1 %
Eosinophils Absolute: 0.1 10*3/uL (ref 0.0–0.5)
Eosinophils Relative: 3 %
HCT: 29 % — ABNORMAL LOW (ref 36.0–46.0)
Hemoglobin: 9.7 g/dL — ABNORMAL LOW (ref 12.0–15.0)
Immature Granulocytes: 1 %
Lymphocytes Relative: 27 %
Lymphs Abs: 1.2 10*3/uL (ref 0.7–4.0)
MCH: 34.4 pg — ABNORMAL HIGH (ref 26.0–34.0)
MCHC: 33.4 g/dL (ref 30.0–36.0)
MCV: 102.8 fL — ABNORMAL HIGH (ref 80.0–100.0)
Monocytes Absolute: 0.5 10*3/uL (ref 0.1–1.0)
Monocytes Relative: 12 %
Neutro Abs: 2.4 10*3/uL (ref 1.7–7.7)
Neutrophils Relative %: 56 %
Platelet Count: 226 10*3/uL (ref 150–400)
RBC: 2.82 MIL/uL — ABNORMAL LOW (ref 3.87–5.11)
RDW: 14.6 % (ref 11.5–15.5)
WBC Count: 4.2 10*3/uL (ref 4.0–10.5)
nRBC: 0 % (ref 0.0–0.2)

## 2021-02-08 LAB — CEA (IN HOUSE-CHCC): CEA (CHCC-In House): 1.71 ng/mL (ref 0.00–5.00)

## 2021-02-08 MED ORDER — SODIUM CHLORIDE 0.9% FLUSH
10.0000 mL | Freq: Once | INTRAVENOUS | Status: AC
  Administered 2021-02-08: 10 mL

## 2021-02-08 NOTE — Progress Notes (Signed)
DISCONTINUE ON PATHWAY REGIMEN - Breast  No Medical Intervention - Off Treatment.  REASON: Other Reason PRIOR TREATMENT: Off Treatment  START OFF PATHWAY REGIMEN - Breast   [id number]:Trastuzumab and hyaluronidase-oysk 600 mg/10,000 units SUBQ D1 q21 Days:   A cycle is every 21 days:     Trastuzumab and hyaluronidase-oysk   **Always confirm dose/schedule in your pharmacy ordering system**  Patient Characteristics: Distant Metastases or Locoregional Recurrent Disease - Unresected or Locally Advanced Unresectable Disease Progressing after Neoadjuvant and Local Therapies, HER2 Positive, ER Negative/Unknown, Chemotherapy, Fourth Line and Beyond, Other Therapeutic Status: Distant Metastases HER2 Status: Positive (+) ER Status: Negative (-) PR Status: Negative (-) Line of Therapy: Fourth Line and Beyond Therapy Indicated: Other Intent of Therapy: Non-Curative / Palliative Intent, Discussed with Patient

## 2021-02-08 NOTE — Assessment & Plan Note (Signed)
07/19/2014: Right breast cancer grade 2 IDC ER/PR negative HER2 positive Ki-67 90% treated with neoadjuvant chemo with TCHP x6 cycles followed by Herceptin maintenance, right lumpectomy had residual disease 4 cm 1/3 lymph node positive, ER/PR negative, adjuvant radiation and capecitabine completed 09/27/2015 12/06/2015: Recurrence: Right subpectoral lymph node and subcutaneous nodule, liver mass (biopsy adenocarcinoma consistent with upper GI or pancreaticobiliary primary, HER2 negative, ER negative) July 2019: Metastatic disease: TDM 1 followed by Herceptin maintenance, HER2 Zenalb added 10/06/2018, Abraxane 12/31/2017-09/01/2018, Herceptin and pertuzumab maintenance  Current treatment: Enhertu started 10/05/2020  Enhertu toxicities:  The plan was to switch her to Herceptin maintenance.   02/04/2021: PET CT scan: Complete metabolic response, no evidence of hypermetabolic disease in the liver (mass measured 2.7 cm previously is now 1.5 cm with no uptake)

## 2021-02-09 ENCOUNTER — Encounter: Payer: Self-pay | Admitting: Podiatry

## 2021-02-09 ENCOUNTER — Ambulatory Visit: Payer: Medicare PPO | Admitting: Podiatry

## 2021-02-09 DIAGNOSIS — N184 Chronic kidney disease, stage 4 (severe): Secondary | ICD-10-CM

## 2021-02-09 DIAGNOSIS — M21611 Bunion of right foot: Secondary | ICD-10-CM

## 2021-02-09 DIAGNOSIS — E1165 Type 2 diabetes mellitus with hyperglycemia: Secondary | ICD-10-CM | POA: Insufficient documentation

## 2021-02-09 DIAGNOSIS — K5730 Diverticulosis of large intestine without perforation or abscess without bleeding: Secondary | ICD-10-CM | POA: Insufficient documentation

## 2021-02-09 DIAGNOSIS — M79675 Pain in left toe(s): Secondary | ICD-10-CM

## 2021-02-09 DIAGNOSIS — K8020 Calculus of gallbladder without cholecystitis without obstruction: Secondary | ICD-10-CM | POA: Insufficient documentation

## 2021-02-09 DIAGNOSIS — M2011 Hallux valgus (acquired), right foot: Secondary | ICD-10-CM | POA: Diagnosis not present

## 2021-02-09 DIAGNOSIS — B351 Tinea unguium: Secondary | ICD-10-CM | POA: Diagnosis not present

## 2021-02-09 DIAGNOSIS — M25519 Pain in unspecified shoulder: Secondary | ICD-10-CM | POA: Insufficient documentation

## 2021-02-09 DIAGNOSIS — E041 Nontoxic single thyroid nodule: Secondary | ICD-10-CM | POA: Insufficient documentation

## 2021-02-09 DIAGNOSIS — M79674 Pain in right toe(s): Secondary | ICD-10-CM | POA: Diagnosis not present

## 2021-02-09 DIAGNOSIS — E1149 Type 2 diabetes mellitus with other diabetic neurological complication: Secondary | ICD-10-CM

## 2021-02-09 DIAGNOSIS — G629 Polyneuropathy, unspecified: Secondary | ICD-10-CM | POA: Insufficient documentation

## 2021-02-09 DIAGNOSIS — E1140 Type 2 diabetes mellitus with diabetic neuropathy, unspecified: Secondary | ICD-10-CM

## 2021-02-09 DIAGNOSIS — M2041 Other hammer toe(s) (acquired), right foot: Secondary | ICD-10-CM | POA: Diagnosis not present

## 2021-02-09 DIAGNOSIS — J309 Allergic rhinitis, unspecified: Secondary | ICD-10-CM | POA: Insufficient documentation

## 2021-02-09 DIAGNOSIS — R933 Abnormal findings on diagnostic imaging of other parts of digestive tract: Secondary | ICD-10-CM | POA: Insufficient documentation

## 2021-02-09 DIAGNOSIS — E7800 Pure hypercholesterolemia, unspecified: Secondary | ICD-10-CM | POA: Insufficient documentation

## 2021-02-09 DIAGNOSIS — E538 Deficiency of other specified B group vitamins: Secondary | ICD-10-CM | POA: Insufficient documentation

## 2021-02-09 DIAGNOSIS — M1711 Unilateral primary osteoarthritis, right knee: Secondary | ICD-10-CM | POA: Insufficient documentation

## 2021-02-09 NOTE — Progress Notes (Signed)
This patient returns to my office for at risk foot care.  This patient requires this care by a professional since this patient will be at risk due to having diabetic neuropathy.    This patient is unable to cut nails herself since the patient cannot reach her nails.These nails are painful walking and wearing shoes.  This patient presents for at risk foot care today.  General Appearance  Alert, conversant and in no acute stress.  Vascular  Dorsalis pedis and posterior tibial  pulses are palpable  bilaterally.  Capillary return is within normal limits  bilaterally. Temperature is within normal limits  bilaterally.  Neurologic  Senn-Weinstein monofilament wire test absent  bilaterally. Muscle power within normal limits bilaterally.  Nails Thick disfigured discolored nails with subungual debris  hallux toenails bilaterally.   No evidence of bacterial infection or drainage bilaterally.  Orthopedic  No limitations of motion  feet .  No crepitus or effusions noted.  No bony pathology or digital deformities noted.  HAV.    Hammer toes  Skin  normotropic skin with no porokeratosis noted bilaterally.  No signs of infections or ulcers noted.     Onychomycosis  Pain in right toes  Pain in left toes  HAV  B/L  Hammer toes  B/L.  Consent was obtained for treatment procedures.   Mechanical debridement of nails 1-5  bilaterally performed with a nail nipper.  Filed with dremel without incident.  Nails were done as a courtesy.Discussed bunion surgery with this patient and recommended seeing Dr.  Paulla Dolly for second opinion concerning her surgery.   Return office visit   10 weeks                  Told patient to return for periodic foot care and evaluation due to potential at risk complications.   Gardiner Barefoot DPM

## 2021-02-09 NOTE — Progress Notes (Signed)
The following biosimilar Kanjinti (trastuzumab-anns) has been selected for use in this patient.  Kennith Center, Pharm.D., CPP 02/09/2021@9 :28 AM

## 2021-02-09 NOTE — Progress Notes (Deleted)
° °  Vascular  Dorsalis pedis and posterior tibial pulses are palpable  B/L.  Capillary return  WNL.  Temperature gradient is  WNL.  Skin turgor  WNL  Sensorium  Senn Weinstein monofilament wire  Absent.. Absent  tactile sensation.  Nail Exam   Thick painful hallux toenails.  No evidence of redness or swelling noted.  Orthopedic  Exam  .  No limitations of motion feet  B/L.  No crepitus or joint effusion noted.  Foot type is unremarkable and digits show no abnormalities.  HAV  B/L.  Hammer toes  B/L.  Skin  No open lesions.  Normal skin texture and turgor.  Onychomycosis  Hallux nails  B/L. Diabetic neuropathy.      Gardiner Barefoot DPM

## 2021-02-12 NOTE — Progress Notes (Signed)
Pharmacist Chemotherapy Monitoring - Initial Assessment    Anticipated start date: 02/19/21   The following has been reviewed per standard work regarding the patient's treatment regimen: The patient's diagnosis, treatment plan and drug doses, and organ/hematologic function Lab orders and baseline tests specific to treatment regimen  The treatment plan start date, drug sequencing, and pre-medications Prior authorization status  Patient's documented medication list, including drug-drug interaction screen and prescriptions for anti-emetics and supportive care specific to the treatment regimen The drug concentrations, fluid compatibility, administration routes, and timing of the medications to be used The patient's access for treatment and lifetime cumulative dose history, if applicable  The patient's medication allergies and previous infusion related reactions, if applicable   Changes made to treatment plan:  N/A  Follow up needed:  Pending authorization for treatment    Larene Beach, [place], 02/12/2021  8:50 AM

## 2021-02-14 ENCOUNTER — Encounter: Payer: Self-pay | Admitting: Neurology

## 2021-02-14 DIAGNOSIS — M21372 Foot drop, left foot: Secondary | ICD-10-CM

## 2021-02-14 DIAGNOSIS — R2689 Other abnormalities of gait and mobility: Secondary | ICD-10-CM

## 2021-02-16 DIAGNOSIS — E113513 Type 2 diabetes mellitus with proliferative diabetic retinopathy with macular edema, bilateral: Secondary | ICD-10-CM | POA: Diagnosis not present

## 2021-02-16 DIAGNOSIS — H31093 Other chorioretinal scars, bilateral: Secondary | ICD-10-CM | POA: Diagnosis not present

## 2021-02-16 DIAGNOSIS — H3582 Retinal ischemia: Secondary | ICD-10-CM | POA: Diagnosis not present

## 2021-02-19 ENCOUNTER — Inpatient Hospital Stay: Payer: Medicare PPO

## 2021-02-19 ENCOUNTER — Other Ambulatory Visit: Payer: Self-pay

## 2021-02-19 VITALS — BP 124/62 | HR 58 | Temp 98.4°F | Resp 16 | Wt 163.8 lb

## 2021-02-19 DIAGNOSIS — Z171 Estrogen receptor negative status [ER-]: Secondary | ICD-10-CM | POA: Diagnosis not present

## 2021-02-19 DIAGNOSIS — C787 Secondary malignant neoplasm of liver and intrahepatic bile duct: Secondary | ICD-10-CM | POA: Diagnosis not present

## 2021-02-19 DIAGNOSIS — D649 Anemia, unspecified: Secondary | ICD-10-CM | POA: Diagnosis not present

## 2021-02-19 DIAGNOSIS — Z7189 Other specified counseling: Secondary | ICD-10-CM

## 2021-02-19 DIAGNOSIS — Z5112 Encounter for antineoplastic immunotherapy: Secondary | ICD-10-CM | POA: Diagnosis not present

## 2021-02-19 DIAGNOSIS — C50811 Malignant neoplasm of overlapping sites of right female breast: Secondary | ICD-10-CM | POA: Diagnosis not present

## 2021-02-19 DIAGNOSIS — C50411 Malignant neoplasm of upper-outer quadrant of right female breast: Secondary | ICD-10-CM

## 2021-02-19 MED ORDER — HEPARIN SOD (PORK) LOCK FLUSH 100 UNIT/ML IV SOLN
500.0000 [IU] | Freq: Once | INTRAVENOUS | Status: AC | PRN
  Administered 2021-02-19: 500 [IU]

## 2021-02-19 MED ORDER — SODIUM CHLORIDE 0.9 % IV SOLN: Freq: Once | INTRAVENOUS | Status: AC

## 2021-02-19 MED ORDER — ACETAMINOPHEN 325 MG PO TABS
650.0000 mg | ORAL_TABLET | Freq: Once | ORAL | Status: AC
  Administered 2021-02-19: 650 mg via ORAL
  Filled 2021-02-19: qty 2

## 2021-02-19 MED ORDER — DIPHENHYDRAMINE HCL 25 MG PO CAPS
50.0000 mg | ORAL_CAPSULE | Freq: Once | ORAL | Status: AC
  Administered 2021-02-19: 25 mg via ORAL
  Filled 2021-02-19: qty 2

## 2021-02-19 MED ORDER — TRASTUZUMAB-ANNS CHEMO 150 MG IV SOLR
6.0000 mg/kg | Freq: Once | INTRAVENOUS | Status: AC
  Administered 2021-02-19: 441 mg via INTRAVENOUS
  Filled 2021-02-19: qty 21

## 2021-02-19 MED ORDER — SODIUM CHLORIDE 0.9% FLUSH
10.0000 mL | INTRAVENOUS | Status: DC | PRN
  Administered 2021-02-19: 10 mL

## 2021-02-19 NOTE — Progress Notes (Signed)
When giving premeds the pt informed RN that she only ever receives 25 mg of benadryl not the ordered dose of 50 mg.  RN confirmed w/ pharmacist, Jaclyn Shaggy, [facility] to only give 25 mg per previous infusion appts.  RN administered 25 mg of benadryl PO to pt.  Colleen to update the treatment plan moving forward to 25 mg of benadryl.

## 2021-02-19 NOTE — Patient Instructions (Signed)
[facility]  Discharge Instructions: °Thank you for choosing [facility] to provide your oncology and hematology care.  ° °If you have a lab appointment with the Cancer Center, please go directly to the Cancer Center and check in at the registration area. °  °Wear comfortable clothing and clothing appropriate for easy access to any Portacath or PICC line.  ° °We strive to give you quality time with your provider. You may need to reschedule your appointment if you arrive late (15 or more minutes).  Arriving late affects you and other patients whose appointments are after yours.  Also, if you miss three or more appointments without notifying the office, you may be dismissed from the clinic at the provider’s discretion.    °  °For prescription refill requests, have your pharmacy contact our office and allow 72 hours for refills to be completed.   ° °Today you received the following chemotherapy and/or immunotherapy agents: Kanjinti °  °To help prevent nausea and vomiting after your treatment, we encourage you to take your nausea medication as directed. ° °BELOW ARE SYMPTOMS THAT SHOULD BE REPORTED IMMEDIATELY: °*FEVER GREATER THAN 100.4 F (38 °C) OR HIGHER °*CHILLS OR SWEATING °*NAUSEA AND VOMITING THAT IS NOT CONTROLLED WITH YOUR NAUSEA MEDICATION °*UNUSUAL SHORTNESS OF BREATH °*UNUSUAL BRUISING OR BLEEDING °*URINARY PROBLEMS (pain or burning when urinating, or frequent urination) °*BOWEL PROBLEMS (unusual diarrhea, constipation, pain near the anus) °TENDERNESS IN MOUTH AND THROAT WITH OR WITHOUT PRESENCE OF ULCERS (sore throat, sores in mouth, or a toothache) °UNUSUAL RASH, SWELLING OR PAIN  °UNUSUAL VAGINAL DISCHARGE OR ITCHING  ° °Items with * indicate a potential emergency and should be followed up as soon as possible or go to the Emergency Department if any problems should occur. ° °Please show the CHEMOTHERAPY ALERT CARD or IMMUNOTHERAPY ALERT CARD at check-in to the  Emergency Department and triage nurse. ° °Should you have questions after your visit or need to cancel or reschedule your appointment, please contact [facility]  Dept: [phone]  and follow the prompts.  Office hours are 8:00 a.m. to 4:30 p.m. Monday - Friday. Please note that voicemails left after 4:00 p.m. may not be returned until the following business day.  We are closed weekends and major holidays. You have access to a nurse at all times for urgent questions. Please call the main number to the clinic Dept: [phone] and follow the prompts. ° ° °For any non-urgent questions, you may also contact your provider using MyChart. We now offer e-Visits for anyone 18 and older to request care online for non-urgent symptoms. For details visit [email or web address]. °  °Also download the MyChart app! Go to the app store, search "MyChart", open the app, select [facility], and log in with your MyChart username and password. ° °Due to Covid, a mask is required upon entering the hospital/clinic. If you do not have a mask, one will be given to you upon arrival. For doctor visits, patients may have 1 support person aged 18 or older with them. For treatment visits, patients cannot have anyone with them due to current Covid guidelines and our immunocompromised population.  ° °

## 2021-02-27 ENCOUNTER — Encounter: Payer: Self-pay | Admitting: Family Medicine

## 2021-02-27 ENCOUNTER — Telehealth (INDEPENDENT_AMBULATORY_CARE_PROVIDER_SITE_OTHER): Payer: Medicare PPO | Admitting: Family Medicine

## 2021-02-27 ENCOUNTER — Encounter (INDEPENDENT_AMBULATORY_CARE_PROVIDER_SITE_OTHER): Payer: Self-pay | Admitting: Family Medicine

## 2021-02-27 VITALS — Ht 69.0 in | Wt 163.8 lb

## 2021-02-27 DIAGNOSIS — U071 COVID-19: Secondary | ICD-10-CM | POA: Diagnosis not present

## 2021-02-27 MED ORDER — MOLNUPIRAVIR EUA 200MG CAPSULE: 4.0000 | ORAL_CAPSULE | Freq: Two times a day (BID) | ORAL | 0 refills | Status: AC

## 2021-02-27 MED ORDER — MOLNUPIRAVIR EUA 200MG CAPSULE: 4.0000 | ORAL_CAPSULE | Freq: Two times a day (BID) | ORAL | 0 refills | Status: DC

## 2021-02-27 NOTE — Telephone Encounter (Signed)
Spoke to patient.  She will keep appt unless she still feels bad.  If that's the case, she will send My Chart msg and we will change to a virtual visit.

## 2021-02-27 NOTE — Progress Notes (Signed)
Virtual Visit via Video Note  I connected with Darlene [name]  on 02/27/21 at  3:00 PM EST by a video enabled telemedicine application and verified that I am speaking with the correct person using two identifiers.  Location patient: [place] Location provider:work or home office Persons participating in the virtual visit: patient, provider  I discussed the limitations and requested verbal permission for telemedicine visit. The patient expressed understanding and agreed to proceed.   HPI:  Acute telemedicine visit for Covid19: -Onset:3-4 days ago; tested positive for covid today -Symptoms include: nasal congestion, cough -Denies: CP, SOB, NVD, fever -Pertinent past medical history: see below, renal disease with GFR in  20s-low 30s -Pertinent medication allergies: Allergies  Allergen Reactions   Nsaids Other (See Comments)    H/o gastric bypass - avoid NSAIDs   Peanut Oil Itching and Other (See Comments)    "Large amounts"   Tape Hives, Rash and Other (See Comments)    Adhesives in bandaids  -COVID-19 vaccine status: reports fully vaccinated and boosted Immunization History  Administered Date(s) Administered   Fluad Quad(high Dose 65+) 10/06/2018, 10/27/2019   Influenza Split 01/22/2003, 12/26/2005, 12/08/2009, 10/11/2010, 10/15/2016   Influenza, High Dose Seasonal PF 11/10/2017   Influenza,inj,Quad PF,6+ Mos 10/15/2016   PFIZER(Purple Top)SARS-COV-2 Vaccination 02/25/2019, 03/22/2019, 09/09/2019, 05/18/2020, 11/13/2020   Pneumococcal Conjugate-13 02/17/2018, 03/03/2018   Pneumococcal Polysaccharide-23 01/21/2001, 10/11/2010   Td 01/22/1996   Tdap 02/06/2016   Zoster, Live 05/03/2013    ROS: See pertinent positives and negatives per HPI.  Past Medical History:  Diagnosis Date   Anemia    hx of - during 1st round chemo   Anxiety    Arthritis    in fingers, shoulders   Back pain    Balance problem    Breast cancer ([place])    Breast cancer of upper-outer quadrant of right female breast  ([place]) 07/22/2014   Bunion, right foot    CHF (congestive heart failure) ([facility])    Chronic kidney disease    creatininei levels high per pt    Clumsiness    Constipation    Depression    Diabetes mellitus without complication ([place])    12+ years type 2    Diabetic retinopathy ([place])    torn retina   Eating disorder    binge eating   Epistaxis 08/26/2014   Fatty liver    Foot drop, left foot    HCAP (healthcare-associated pneumonia) 12/18/2014   Heart murmur    never had any problems   History of radiation therapy 04/05/15-05/19/15   right chest wall was treated to 50.4 Gy in 28 fractions, right mastectomy scar was treated to 10 Gy in 5 fractions   Hyperlipidemia    Hypertension    Joint pain    Multiple food allergies    Neuromuscular disorder ([place])    diabetic neuropathy   Obesity    Obstructive sleep apnea on CPAP    does not use cpap all the time has lost 160 lbs    Ovarian cyst rupture    possible   Pneumonia    Pneumonia 11/2014   Radiation 04/05/15-05/19/15   right chest wall 50.4 Gy, mastectomy scar 10 Gy   Sleep apnea    Thyroid nodule 06/2014    Past Surgical History:  Procedure Laterality Date   APPENDECTOMY     BIOPSY THYROID Bilateral 06/2014   2 nodules - Benign    BREAST LUMPECTOMY WITH RADIOACTIVE SEED AND SENTINEL LYMPH NODE BIOPSY Right 12/27/2014  Procedure: BREAST LUMPECTOMY WITH RADIOACTIVE SEED AND SENTINEL LYMPH NODE BIOPSY;  Surgeon: Stark Klein, MD;  Location: [facility];  Service: General;  Laterality: Right;   BREAST REDUCTION SURGERY Bilateral 01/06/2015   Procedure: BILATERAL BREAST REDUCTION, RIGHT ONCOPLASTIC RECONSTRUCTION),LEFT BREAST REDUCTION FOR SYMMETRY;  Surgeon: [name], MD;  Location: [facility];  Service: Plastics;  Laterality: Bilateral;   BREATH TEK H PYLORI N/A 09/15/2012   Procedure: BREATH TEK H PYLORI;  Surgeon: Pedro Earls, MD;  Location: [facility];  Service: General;  Laterality: N/A;   BUNIONECTOMY Left  12/2013   CARPAL TUNNEL RELEASE Right    CARPAL TUNNEL RELEASE Left    CATARACT EXTRACTION     6/19   COLONOSCOPY     DUPUYTREN CONTRACTURE RELEASE     GASTRIC ROUX-EN-Y N/A 11/02/2012   Procedure: LAPAROSCOPIC ROUX-EN-Y GASTRIC;  Surgeon: Pedro Earls, MD;  Location: [facility];  Service: General;  Laterality: N/A;   IR GENERIC HISTORICAL  03/18/2016   IR US GUIDE VASC ACCESS LEFT 03/18/2016 Markus Daft, MD [facility]   IR GENERIC HISTORICAL  03/18/2016   IR FLUORO GUIDE CV LINE LEFT 03/18/2016 Markus Daft, MD [facility]   LAPAROSCOPIC APPENDECTOMY N/A 07/22/2015   Procedure: APPENDECTOMY LAPAROSCOPIC;  Surgeon: Michael Boston, MD;  Location: [facility];  Service: General;  Laterality: N/A;   MASTECTOMY Right 02/15/2015   modified   MODIFIED MASTECTOMY Right 02/15/2015   Procedure: RIGHT MODIFIED RADICAL MASTECTOMY;  Surgeon: Stark Klein, MD;  Location: [facility];  Service: General;  Laterality: Right;   ORIF WRIST FRACTURE Left 05/11/2019   Procedure: OPEN REDUCTION INTERNAL FIXATION (ORIF) left distal radius fracture;  Surgeon: Renette Butters, MD;  Location: [facility];  Service: Orthopedics;  Laterality: Left;  with block   PORT-A-CATH REMOVAL Left 10/10/2015   Procedure: PORT REMOVAL;  Surgeon: Stark Klein, MD;  Location: [facility];  Service: General;  Laterality: Left;   PORTACATH PLACEMENT Left 08/02/2014   Procedure: INSERTION PORT-A-CATH;  Surgeon: Stark Klein, MD;  Location: [facility];  Service: General;  Laterality: Left;   PORTACATH PLACEMENT N/A 11/05/2016   Procedure: INSERTION PORT-A-CATH;  Surgeon: Stark Klein, MD;  Location: [facility];  Service: General;  Laterality: N/A;   RETINAL LASER PROCEDURE     retina tear on left eye   ROTATOR CUFF REPAIR Right 2005   SCAR REVISION Right 12/08/2015   Procedure: COMPLEX REPAIR RIGHT CHEST 10-15CM;  Surgeon: [name], MD;  Location: [facility];  Service: Plastics;  Laterality: Right;   TOE SURGERY  1970s   bone  spur   TRIGGER FINGER RELEASE     x3     Current Outpatient Medications:    aspirin EC 81 MG tablet, Take 1 tablet (81 mg total) by mouth daily., Disp: 90 tablet, Rfl: 3   Blood Glucose Monitoring Suppl (ACCU-CHEK GUIDE) w/Device KIT, 1 each by Does not apply route 4 (four) times daily. Use to test blood sugars 4x a day, Disp: 1 kit, Rfl: 0   buPROPion (WELLBUTRIN SR) 200 MG 12 hr tablet, Take one tablet by mouth once daily., Disp: 90 tablet, Rfl: 0   CALCIUM CITRATE-VITAMIN D PO, Take 1 tablet by mouth 3 (three) times daily. , Disp: , Rfl:    carvedilol (COREG) 6.25 MG tablet, Take 1 tablet (6.25 mg total) by mouth 2 (two) times daily., Disp: 180 tablet, Rfl: 3   cholecalciferol (VITAMIN D) 1000 units tablet, Take 1,000 Units by mouth  daily., Disp: , Rfl:    Continuous Blood Gluc Sensor (FREESTYLE LIBRE 2 SENSOR) MISC, , Disp: , Rfl:    Cyanocobalamin (VITAMIN B-12 PO), Take 1 tablet by mouth every 14 (fourteen) days., Disp: , Rfl:    diphenhydrAMINE (BENADRYL) 25 MG tablet, Take 25 mg by mouth daily as needed for allergies or sleep., Disp: , Rfl:    fam-trastuzumab deruxtecan-nxki (ENHERTU) 100 MG SOLR, See admin instructions., Disp: , Rfl:    Finerenone (KERENDIA) 10 MG TABS, Take 10 mg by mouth daily., Disp: 30 tablet, Rfl: 6   insulin aspart (NOVOLOG) 100 UNIT/ML injection, Inject into the skin daily as needed for high blood sugar. Per patient, sliding scale with meals as needed, Disp: , Rfl:    insulin glargine (LANTUS SOLOSTAR) 100 UNIT/ML Solostar Pen, Inject 8 Units into the skin daily., Disp: 7 mL, Rfl: 3   loratadine (CLARITIN) 10 MG tablet, Take 10 mg by mouth as needed for allergies., Disp: , Rfl:    losartan (COZAAR) 100 MG tablet, 1 tablet, Disp: , Rfl:    melatonin 5 MG TABS, Take 5 mg by mouth at bedtime., Disp: , Rfl:    molnupiravir EUA (LAGEVRIO) 200 mg CAPS capsule, Take 4 capsules (800 mg total) by mouth 2 (two) times daily for 5 days., Disp: 40 capsule, Rfl: 0    NEXLIZET 180-10 MG TABS, TAKE 1 TABLET BY MOUTH DAILY., Disp: 30 tablet, Rfl: 11   omeprazole (PRILOSEC) 40 MG capsule, Take 1 capsule (40 mg total) by mouth daily., Disp: 30 capsule, Rfl: 1   ondansetron (ZOFRAN) 8 MG tablet, Take 1 tablet (8 mg total) by mouth every 8 (eight) hours as needed for nausea or vomiting. Start on day 3 after chemo., Disp: 30 tablet, Rfl: 2   Pediatric Multivitamins-Iron (FLINTSTONES PLUS IRON) chewable tablet, Chew 1 tablet by mouth 2 (two) times daily., Disp: , Rfl:    Probiotic Product ([employer] PROBIOTIC PO), Take 1 capsule by mouth daily. , Disp: , Rfl:    Propylene Glycol (SYSTANE BALANCE OP), Place 1-2 drops into both eyes 3 (three) times daily as needed (for dry eyes). , Disp: , Rfl:    senna-docusate (SENOKOT-S) 8.6-50 MG tablet, Take 1 tablet by mouth daily as needed for moderate constipation., Disp: , Rfl:    SURE COMFORT PEN NEEDLES 32G X 4 MM MISC, Use as instructed to inject insulin and Victoza daily., Disp: 200 each, Rfl: 0   tobramycin (TOBREX) 0.3 % ophthalmic ointment, Place 1 application into both eyes as needed (Takes day before eye injections)., Disp: , Rfl:    vortioxetine HBr (TRINTELLIX) 20 MG TABS tablet, Take 1 tablet (20 mg total) by mouth at bedtime., Disp: 90 tablet, Rfl: 0   Accu-Chek Softclix Lancets lancets, 1 each by Other route 4 (four) times daily. Test blood sugars 4 x times daily, Disp: 200 each, Rfl: 1   Blood Glucose Calibration (ACCU-CHEK GUIDE CONTROL) LIQD, 1 Bottle by In Vitro route as needed., Disp: 1 each, Rfl: 0   glucose blood (ACCU-CHEK GUIDE) test strip, Use Accu Chek test strips to check blood sugar 4 times daily. DX:E11.21, Disp: 400 each, Rfl: 0  EXAM:  VITALS per patient if applicable:  GENERAL: alert, oriented, appears well and in no acute distress  HEENT: atraumatic, conjunttiva clear, no obvious abnormalities on inspection of external nose and ears  NECK: normal movements of the head and neck  LUNGS: on inspection  no signs of respiratory distress, breathing rate appears normal, no obvious  gross SOB, gasping or wheezing  CV: no obvious cyanosis  MS: moves all visible extremities without noticeable abnormality  PSYCH/NEURO: pleasant and cooperative, no obvious depression or anxiety, speech and thought processing grossly intact  ASSESSMENT AND PLAN:  Discussed the following assessment and plan:  COVID-19   Discussed treatment options and risk of drug interactions, ideal treatment window, potential complications, isolation and precautions for COVID-19.  Discussed possibility of rebound with antivirals and the need to reisolate if it should occur for 5 days. Checked for/reviewed any labs done in the last 90 days with GFR listed in HPI if available. After lengthy discussion, the patient opted for treatment with Legevrio due to being higher risk for complications of covid or severe disease and other factors. Discussed EUA status of this drug and the fact that there is preliminary limited knowledge of risks/interactions/side effects per EUA document vs possible benefits and precautions. This information was shared with patient during the visit and also was provided in patient instructions. Also, advised that patient discuss risks/interactions and use with pharmacist/treatment team as well.  Other symptomatic care measures summarized in patient instructions. Advised to seek prompt virtual visit or in person care if worsening, new symptoms arise, or if is not improving with treatment as expected per our conversation of expected course. Discussed options for follow up care. Did let this patient know that I do telemedicine on Tuesdays and Thursdays for [facility] and those are the days I am logged into the system. Advised to schedule follow up visit with PCP, [facility] virtual visits or [facility] if any further questions or concerns to avoid delays in care.   I discussed the assessment and treatment plan with the patient. The  patient was provided an opportunity to ask questions and all were answered. The patient agreed with the plan and demonstrated an understanding of the instructions.     Lucretia Kern, DO

## 2021-02-27 NOTE — Patient Instructions (Addendum)
HOME CARE TIPS:   -I sent the medication(s) we discussed to your pharmacy: Meds ordered this encounter  Medications   DISCONTD: molnupiravir EUA (LAGEVRIO) 200 mg CAPS capsule    Sig: Take 4 capsules (800 mg total) by mouth 2 (two) times daily for 5 days.    Dispense:  40 capsule    Refill:  0   molnupiravir EUA (LAGEVRIO) 200 mg CAPS capsule    Sig: Take 4 capsules (800 mg total) by mouth 2 (two) times daily for 5 days.    Dispense:  40 capsule    Refill:  0     -I sent in the [facility] treatment or referral you requested per our discussion. Please see the information provided below and discuss further with the pharmacist/treatment team.   -there is a chance of rebound illness after finishing your treatment. If you become sick again please isolate for an additional 5 days, plus 5 more days of masking.   -can use aleve if needed for fevers, aches and pains per instructions  -can use nasal saline a few times per day if you have nasal congestion  -stay hydrated, drink plenty of fluids and eat small healthy meals - avoid dairy  -If the Covid test is positive, check out the [facility] website for more information on home care, transmission and treatment for COVID19  -follow up with your doctor in 2-3 days unless improving and feeling better  -stay home while sick, except to seek medical care. If you have COVID19, you will likely be contagious for 7-10 days. Flu or Influenza is likely contagious for about 7 days. Other respiratory viral infections remain contagious for 5-10+ days depending on the virus and many other factors. Wear a good mask that fits snugly (such as N95 or KN95) if around others to reduce the risk of transmission.  It was nice to meet you today, and I really hope you are feeling better soon. I help [facility] out with telemedicine visits on Tuesdays and Thursdays and am happy to help if you need a follow up virtual visit on those days. Otherwise, if you have any concerns or  questions following this visit please schedule a follow up visit with your Primary Care doctor or seek care at a local urgent care clinic to avoid delays in care.    Seek in person care or schedule a follow up video visit promptly if your symptoms worsen, new concerns arise or you are not improving with treatment. Call 911 and/or seek emergency care if your symptoms are severe or life threatening.    Fact Sheet for Patients And Caregivers Emergency Use Authorization (EUA) Of LAGEVRIO (molnupiravir) capsules For Coronavirus Disease 2019 (COVID-19)  What is the most important information I should know about LAGEVRIO? LAGEVRIO may cause serious side effects, including: ? LAGEVRIO may cause harm to your unborn baby. It is not known if LAGEVRIO will harm your baby if you take LAGEVRIO during pregnancy. o LAGEVRIO is not recommended for use in pregnancy. o LAGEVRIO has not been studied in pregnancy. LAGEVRIO was studied in pregnant animals only. When LAGEVRIO was given to pregnant animals, LAGEVRIO caused harm to their unborn babies. o You and your healthcare provider may decide that you should take LAGEVRIO during pregnancy if there are no other COVID-19 treatment options approved or authorized by the FDA that are accessible or clinically appropriate for you. o If you and your healthcare provider decide that you should take [place] during pregnancy, you and your healthcare provider  should discuss the known and potential benefits and the potential risks of taking LAGEVRIO during pregnancy. For individuals who are able to become pregnant: ? You should use a reliable method of birth control (contraception) consistently and correctly during treatment with LAGEVRIO and for 4 days after the last dose of LAGEVRIO. Talk to your healthcare provider about reliable birth control methods. ? Before starting treatment with [facility] your healthcare provider may do a pregnancy test to see if you are  pregnant before starting treatment with LAGEVRIO. ? Tell your healthcare provider right away if you become pregnant or think you may be pregnant during treatment with LAGEVRIO. Pregnancy Surveillance Program: ? There is a pregnancy surveillance program for individuals who take LAGEVRIO during pregnancy. The purpose of this program is to collect information about the health of you and your baby. Talk to your healthcare provider about how to take part in this program. ? If you take LAGEVRIO during pregnancy and you agree to participate in the pregnancy surveillance program and allow your healthcare provider to share your information with [place], then your healthcare provider will report your use of [place] during pregnancy to [place]. by calling [phone] or [email or web address].es. For individuals who are sexually active with partners who are able to become pregnant: ? It is not known if LAGEVRIO can affect sperm. While the risk is regarded as low, animal studies to fully assess the potential for LAGEVRIO to affect the babies of males treated with LAGEVRIO have not been completed. A reliable method of birth control (contraception) should be used consistently and correctly during treatment with LAGEVRIO and for at least 3 months after the last dose. The risk to sperm beyond 3 months is not known. Studies to understand the risk to sperm beyond 3 months are ongoing. Talk to your healthcare provider about reliable birth control methods. Talk to your healthcare provider if you have questions or concerns about how LAGEVRIO may affect sperm. You are being given this fact sheet because your healthcare provider believes it is necessary to provide you with LAGEVRIO for the treatment of adults with mild-to-moderate coronavirus disease 2019 (COVID-19) with positive results of direct SARS-CoV-2 viral testing, and who are at high risk for progression to severe COVID-19  including hospitalization or death, and for whom other COVID-19 treatment options approved or authorized by the FDA are not accessible or clinically appropriate. The U.S. Food and Drug Administration (FDA) has issued an Emergency Use Authorization (EUA) to make LAGEVRIO available during the COVID-19 pandemic (for more details about an EUA please see What is an Emergency Use Authorization? at the end of this document). LAGEVRIO is not an FDA-approved medicine in the [place]. Read this Fact Sheet for information about LAGEVRIO. Talk to your healthcare provider about your options if you have any questions. It is your choice to take LAGEVRIO.  What is COVID-19? COVID-19 is caused by a virus called a coronavirus. You can get COVID-19 through close contact with another person who has the virus. COVID-19 illnesses have ranged from very mild-to-severe, including illness resulting in death. While information so far suggests that most COVID-19 illness is mild, serious illness can happen and may cause some of your other medical conditions to become worse. Older people and people of all ages with severe, long lasting (chronic) medical conditions like heart disease, lung disease and diabetes, for example seem to be at higher risk of being hospitalized for COVID-19.  What is LAGEVRIO? LAGEVRIO is an  investigational medicine used to treat mild-to-moderate COVID-19 in adults: ? with positive results of direct SARS-CoV-2 viral testing, and ? who are at high risk for progression to severe COVID-19 including hospitalization or death, and for whom other COVID-19 treatment options approved or authorized by the FDA are not accessible or clinically appropriate. The FDA has authorized the emergency use of LAGEVRIO for the treatment of mild-tomoderate COVID-19 in adults under an EUA. For more information on EUA, see the What is an Emergency Use Authorization (EUA)? section at the end of this Fact  Sheet. LAGEVRIO is not authorized: ? for use in people less than 73 years of age. ? for prevention of COVID-19. ? for people needing hospitalization for COVID-19. ? for use for longer than 5 consecutive days.  What should I tell my healthcare provider before I take LAGEVRIO? Tell your healthcare provider if you: ? Have any allergies ? Are breastfeeding or plan to breastfeed ? Have any serious illnesses ? Are taking any medicines (prescription, over-the-counter, vitamins, or herbal products).  How do I take LAGEVRIO? ? Take LAGEVRIO exactly as your healthcare provider tells you to take it. ? Take 4 capsules of LAGEVRIO every 12 hours (for example, at 8 am and at 8 pm) ? Take LAGEVRIO for 5 days. It is important that you complete the full 5 days of treatment with LAGEVRIO. Do not stop taking LAGEVRIO before you complete the full 5 days of treatment, even if you feel better. ? Take LAGEVRIO with or without food. ? You should stay in isolation for as long as your healthcare provider tells you to. Talk to your healthcare provider if you are not sure about how to properly isolate while you have COVID-19. ? Swallow LAGEVRIO capsules whole. Do not open, break, or crush the capsules. If you cannot swallow capsules whole, tell your healthcare provider. ? What to do if you miss a dose: o If it has been less than 10 hours since the missed dose, take it as soon as you remember o If it has been more than 10 hours since the missed dose, skip the missed dose and take your dose at the next scheduled time. ? Do not double the dose of LAGEVRIO to make up for a missed dose.  What are the important possible side effects of LAGEVRIO? ? See, What is the most important information I should know about LAGEVRIO? ? Allergic Reactions. Allergic reactions can happen in people taking LAGEVRIO, even after only 1 dose. Stop taking LAGEVRIO and call your healthcare provider right away if you get any of the  following symptoms of an allergic reaction: o hives o rapid heartbeat o trouble swallowing or breathing o swelling of the mouth, lips, or face o throat tightness o hoarseness o skin rash The most common side effects of LAGEVRIO are: ? diarrhea ? nausea ? dizziness These are not all the possible side effects of LAGEVRIO. Not many people have taken LAGEVRIO. Serious and unexpected side effects may happen. This medicine is still being studied, so it is possible that all of the risks are not known at this time.  What other treatment choices are there?  Veklury (remdesivir) is FDA-approved as an intravenous (IV) infusion for the treatment of mildto-moderate MWUXL-24 in certain adults and children. Talk with your doctor to see if Marijean Heath is appropriate for you. Like LAGEVRIO, FDA may also allow for the emergency use of other medicines to treat people with COVID-19. Go to LacrosseProperties.si for more information. It is  your choice to be treated or not to be treated with LAGEVRIO. Should you decide not to take it, it will not change your standard medical care.  What if I am breastfeeding? Breastfeeding is not recommended during treatment with LAGEVRIO and for 4 days after the last dose of LAGEVRIO. If you are breastfeeding or plan to breastfeed, talk to your healthcare provider about your options and specific situation before taking LAGEVRIO.  How do I report side effects with LAGEVRIO? Contact your healthcare provider if you have any side effects that bother you or do not go away. Report side effects to FDA MedWatch at SmoothHits.hu or call 1-800-FDA-1088 (1- [phone]).  How should I store [place]? ? Store LAGEVRIO capsules at room temperature between 62F to 75F (20C to 25C). ? Keep LAGEVRIO and all medicines out of the reach of children and pets. How can I learn more  about COVID-19? ? Ask your healthcare provider. ? Visit SeekRooms.co.uk ? Contact your local or state public health department. ? Call [place] at [phone] (toll free in the [place] ? Visit www.molnupiravir.com  What Is an Emergency Use Authorization (EUA)? The [place] FDA has made [place] available under an emergency access mechanism called an Emergency Use Authorization (EUA) The EUA is supported by a Presenter, broadcasting Health and Human Service (HHS) declaration that circumstances exist to justify emergency use of drugs and biological products during the COVID-19 pandemic. LAGEVRIO for the treatment of mild-to-moderate COVID-19 in adults with positive results of direct SARS-CoV-2 viral testing, who are at high risk for progression to severe COVID-19, including hospitalization or death, and for whom alternative COVID-19 treatment options approved or authorized by FDA are not accessible or clinically appropriate, has not undergone the same type of review as an FDA-approved product. In issuing an EUA under the BJSEG-31 public health emergency, the FDA has determined, among other things, that based on the total amount of scientific evidence available including data from adequate and well-controlled clinical trials, if available, it is reasonable to believe that the product may be effective for diagnosing, treating, or preventing COVID-19, or a serious or life-threatening disease or condition caused by COVID-19; that the known and potential benefits of the product, when used to diagnose, treat, or prevent such disease or condition, outweigh the known and potential risks of such product; and that there are no adequate, approved, and available alternatives.  All of these criteria must be met to allow for the product to be used in the treatment of patients during the COVID-19 pandemic. The EUA for LAGEVRIO is in effect for the duration of the COVID-19 declaration justifying  emergency use of LAGEVRIO, unless terminated or revoked (after which LAGEVRIO may no longer be used under the EUA). For patent information: http://rogers.info/ Copyright  2021-2022 [employer] [place], [place] and its affiliates. All rights reserved. [id number] Revised: March 2022

## 2021-02-28 ENCOUNTER — Ambulatory Visit: Payer: Medicare PPO

## 2021-02-28 ENCOUNTER — Ambulatory Visit: Payer: Medicare PPO | Admitting: Pharmacist

## 2021-02-28 ENCOUNTER — Other Ambulatory Visit: Payer: Medicare PPO

## 2021-03-05 ENCOUNTER — Encounter (INDEPENDENT_AMBULATORY_CARE_PROVIDER_SITE_OTHER): Payer: Self-pay | Admitting: Family Medicine

## 2021-03-05 ENCOUNTER — Other Ambulatory Visit: Payer: Self-pay

## 2021-03-05 ENCOUNTER — Ambulatory Visit (INDEPENDENT_AMBULATORY_CARE_PROVIDER_SITE_OTHER): Payer: Medicare PPO | Admitting: Family Medicine

## 2021-03-05 VITALS — BP 113/56 | HR 68 | Temp 97.8°F | Ht 69.0 in | Wt 157.0 lb

## 2021-03-05 DIAGNOSIS — Z6823 Body mass index (BMI) 23.0-23.9, adult: Secondary | ICD-10-CM | POA: Diagnosis not present

## 2021-03-05 DIAGNOSIS — E669 Obesity, unspecified: Secondary | ICD-10-CM

## 2021-03-05 DIAGNOSIS — Z794 Long term (current) use of insulin: Secondary | ICD-10-CM

## 2021-03-05 DIAGNOSIS — F3289 Other specified depressive episodes: Secondary | ICD-10-CM

## 2021-03-05 DIAGNOSIS — E1140 Type 2 diabetes mellitus with diabetic neuropathy, unspecified: Secondary | ICD-10-CM | POA: Diagnosis not present

## 2021-03-05 DIAGNOSIS — Z6833 Body mass index (BMI) 33.0-33.9, adult: Secondary | ICD-10-CM

## 2021-03-05 MED ORDER — BUPROPION HCL ER (SR) 200 MG PO TB12: ORAL_TABLET | ORAL | 0 refills | Status: DC

## 2021-03-05 MED ORDER — VORTIOXETINE HBR 20 MG PO TABS: 20.0000 mg | ORAL_TABLET | Freq: Every day | ORAL | 0 refills | Status: DC

## 2021-03-05 NOTE — Progress Notes (Signed)
Chief Complaint:   OBESITY Marine is here to discuss her progress with her obesity treatment plan along with follow-up of her obesity related diagnoses. Christen is on keeping a food journal and adhering to recommended goals of 1800 calories and 85+ grams of protein daily and states she is following her eating plan approximately 80% of the time. Toini states she is doing 0 minutes 0 times per week.  Today's visit was #: 29 Starting weight: 225 lbs Starting date: 08/09/2016 Today's weight: 187 lbs Today's date: 03/05/2021 Total lbs lost to date: 68 Total lbs lost since last in-office visit: 4  Interim History: Nani continues to lose weight. She has been on metastatic cancer treatment, which has made eating difficult. She has lost muscle mass, and she is getting ready to restart physical therapy with emphasis on strengthening and balance.  Subjective:   1. Type 2 diabetes mellitus with diabetic neuropathy, with long-term current use of insulin (HCC) Andreya has been on steroids recently. She is working on her diet and now has a Risk analyst continuous glucose monitoring. She is having some highs and lows, and her recent A1c was 7.1.  2. Other depression with emotional eating Addalynn's mood is stable on her medications. Her last scan showed cancer gene. She is sleeping well and feeling hopeful for the future.   Assessment/Plan:   1. Type 2 diabetes mellitus with diabetic neuropathy, with long-term current use of insulin (HCC) Niza is to increase her protein and healthy fats to help stabilize her glucose excursions.   2. Other depression with emotional eating Behavior modification techniques were discussed today to help Yilia deal with her emotional/non-hunger eating behaviors. We will refill Wellbutrin SR and Trintellix for 90 days with no refills. Orders and follow up as documented in patient record.   - buPROPion (WELLBUTRIN SR) 200 MG 12 hr tablet; Take one tablet by  mouth once daily.  Dispense: 90 tablet; Refill: 0 - vortioxetine HBr (TRINTELLIX) 20 MG TABS tablet; Take 1 tablet (20 mg total) by mouth at bedtime.  Dispense: 90 tablet; Refill: 0  3. Obesity with current BMI 23.2 Kalii is currently in the action stage of change. As such, her goal is to continue with weight loss efforts. She has agreed to keeping a food journal and adhering to recommended goals of 1500-1800 calories and 90+ grams of protein daily.   Behavioral modification strategies: increasing lean protein intake and no skipping meals.  Ahilyn has agreed to follow-up with our clinic in 2 months. She was informed of the importance of frequent follow-up visits to maximize her success with intensive lifestyle modifications for her multiple health conditions.   Objective:   Blood pressure (!) 113/56, pulse 68, temperature 97.8 F (36.6 C), temperature source Oral, height 5\' 9"  (1.753 m), weight 157 lb (71.2 kg), last menstrual period 10/22/2007, SpO2 97 %. Body mass index is 23.18 kg/m.  General: Cooperative, alert, well developed, in no acute distress. HEENT: Conjunctivae and lids unremarkable. Cardiovascular: Regular rhythm.  Lungs: Normal work of breathing. Neurologic: No focal deficits.   Lab Results  Component Value Date   CREATININE 1.78 (H) 02/08/2021   BUN 42 (H) 02/08/2021   NA 139 02/08/2021   K 4.3 02/08/2021   CL 107 02/08/2021   CO2 27 02/08/2021   Lab Results  Component Value Date   ALT 10 02/08/2021   AST 21 02/08/2021   ALKPHOS 40 02/08/2021   BILITOT 0.3 02/08/2021   Lab Results  Component Value  Date   HGBA1C 6.3 (A) 11/23/2020   HGBA1C 5.3 07/04/2020   HGBA1C 5.2 01/04/2020   HGBA1C 5.4 10/05/2019   HGBA1C 5.6 05/11/2019   Lab Results  Component Value Date   INSULIN 7.6 01/25/2019   INSULIN 15.3 03/11/2017   INSULIN 18.3 08/09/2016   Lab Results  Component Value Date   TSH 1.54 02/07/2017   Lab Results  Component Value Date   CHOL 94  10/10/2020   HDL 55 10/10/2020   LDLCALC 26 10/10/2020   TRIG 63 10/10/2020   CHOLHDL 1.7 10/10/2020   Lab Results  Component Value Date   VD25OH 49.3 01/04/2020   VD25OH 43.6 10/05/2019   VD25OH 43.5 08/27/2018   Lab Results  Component Value Date   WBC 4.2 02/08/2021   HGB 9.7 (L) 02/08/2021   HCT 29.0 (L) 02/08/2021   MCV 102.8 (H) 02/08/2021   PLT 226 02/08/2021   Lab Results  Component Value Date   IRON 58 02/18/2013   TIBC 305 02/18/2013   FERRITIN 133 10/31/2014    Obesity Behavioral Intervention:   Approximately 15 minutes were spent on the discussion below.  ASK: We discussed the diagnosis of obesity with [name] today and Ginger agreed to give [name] permission to discuss obesity behavioral modification therapy today.  ASSESS: Eryn has the diagnosis of obesity and her BMI today is 23.2. Jannat is in the action stage of change.   ADVISE: Karley was educated on the multiple health risks of obesity as well as the benefit of weight loss to improve her health. She was advised of the need for long term treatment and the importance of lifestyle modifications to improve her current health and to decrease her risk of future health problems.  AGREE: Multiple dietary modification options and treatment options were discussed and Makenli agreed to follow the recommendations documented in the above note.  ARRANGE: Raphael was educated on the importance of frequent visits to treat obesity as outlined per CMS and USPSTF guidelines and agreed to schedule her next follow up appointment today.  Attestation Statements:   Reviewed by clinician on day of visit: allergies, medications, problem list, medical history, surgical history, family history, social history, and previous encounter notes.   I, [name], am acting as transcriptionist for Dennard Nip, MD.  I have reviewed the above documentation for accuracy and completeness, and I agree with the above. -   Dennard Nip, MD

## 2021-03-06 DIAGNOSIS — R269 Unspecified abnormalities of gait and mobility: Secondary | ICD-10-CM | POA: Diagnosis not present

## 2021-03-06 DIAGNOSIS — M6259 Muscle wasting and atrophy, not elsewhere classified, multiple sites: Secondary | ICD-10-CM | POA: Diagnosis not present

## 2021-03-07 ENCOUNTER — Ambulatory Visit (INDEPENDENT_AMBULATORY_CARE_PROVIDER_SITE_OTHER): Payer: Medicare PPO | Admitting: Family Medicine

## 2021-03-08 DIAGNOSIS — F339 Major depressive disorder, recurrent, unspecified: Secondary | ICD-10-CM | POA: Diagnosis not present

## 2021-03-08 DIAGNOSIS — M6259 Muscle wasting and atrophy, not elsewhere classified, multiple sites: Secondary | ICD-10-CM | POA: Diagnosis not present

## 2021-03-08 DIAGNOSIS — M25572 Pain in left ankle and joints of left foot: Secondary | ICD-10-CM | POA: Diagnosis not present

## 2021-03-08 DIAGNOSIS — E118 Type 2 diabetes mellitus with unspecified complications: Secondary | ICD-10-CM | POA: Diagnosis not present

## 2021-03-08 DIAGNOSIS — M25671 Stiffness of right ankle, not elsewhere classified: Secondary | ICD-10-CM | POA: Diagnosis not present

## 2021-03-08 DIAGNOSIS — Z9181 History of falling: Secondary | ICD-10-CM | POA: Diagnosis not present

## 2021-03-08 DIAGNOSIS — I10 Essential (primary) hypertension: Secondary | ICD-10-CM | POA: Diagnosis not present

## 2021-03-08 DIAGNOSIS — R269 Unspecified abnormalities of gait and mobility: Secondary | ICD-10-CM | POA: Diagnosis not present

## 2021-03-10 DIAGNOSIS — E1165 Type 2 diabetes mellitus with hyperglycemia: Secondary | ICD-10-CM | POA: Diagnosis not present

## 2021-03-12 ENCOUNTER — Encounter: Payer: Self-pay | Admitting: Hematology and Oncology

## 2021-03-12 NOTE — Telephone Encounter (Signed)
No entry 

## 2021-03-13 DIAGNOSIS — M6259 Muscle wasting and atrophy, not elsewhere classified, multiple sites: Secondary | ICD-10-CM | POA: Diagnosis not present

## 2021-03-13 DIAGNOSIS — M25572 Pain in left ankle and joints of left foot: Secondary | ICD-10-CM | POA: Diagnosis not present

## 2021-03-13 DIAGNOSIS — F339 Major depressive disorder, recurrent, unspecified: Secondary | ICD-10-CM | POA: Diagnosis not present

## 2021-03-13 DIAGNOSIS — E118 Type 2 diabetes mellitus with unspecified complications: Secondary | ICD-10-CM | POA: Diagnosis not present

## 2021-03-13 DIAGNOSIS — M25671 Stiffness of right ankle, not elsewhere classified: Secondary | ICD-10-CM | POA: Diagnosis not present

## 2021-03-13 DIAGNOSIS — Z9181 History of falling: Secondary | ICD-10-CM | POA: Diagnosis not present

## 2021-03-13 DIAGNOSIS — I10 Essential (primary) hypertension: Secondary | ICD-10-CM | POA: Diagnosis not present

## 2021-03-13 DIAGNOSIS — R269 Unspecified abnormalities of gait and mobility: Secondary | ICD-10-CM | POA: Diagnosis not present

## 2021-03-16 ENCOUNTER — Encounter: Payer: Self-pay | Admitting: Internal Medicine

## 2021-03-16 DIAGNOSIS — M6259 Muscle wasting and atrophy, not elsewhere classified, multiple sites: Secondary | ICD-10-CM | POA: Diagnosis not present

## 2021-03-16 DIAGNOSIS — K5900 Constipation, unspecified: Secondary | ICD-10-CM

## 2021-03-16 DIAGNOSIS — Z9181 History of falling: Secondary | ICD-10-CM | POA: Diagnosis not present

## 2021-03-16 DIAGNOSIS — R269 Unspecified abnormalities of gait and mobility: Secondary | ICD-10-CM | POA: Diagnosis not present

## 2021-03-16 DIAGNOSIS — M25671 Stiffness of right ankle, not elsewhere classified: Secondary | ICD-10-CM | POA: Diagnosis not present

## 2021-03-16 DIAGNOSIS — F339 Major depressive disorder, recurrent, unspecified: Secondary | ICD-10-CM | POA: Diagnosis not present

## 2021-03-16 DIAGNOSIS — E118 Type 2 diabetes mellitus with unspecified complications: Secondary | ICD-10-CM | POA: Diagnosis not present

## 2021-03-16 DIAGNOSIS — I10 Essential (primary) hypertension: Secondary | ICD-10-CM | POA: Diagnosis not present

## 2021-03-16 DIAGNOSIS — M25572 Pain in left ankle and joints of left foot: Secondary | ICD-10-CM | POA: Diagnosis not present

## 2021-03-16 NOTE — Progress Notes (Signed)
Patient Care Team: Isaac Bliss, Rayford Halsted, MD as PCP - General (Internal Medicine) Larey Dresser, MD as PCP - Advanced Heart Failure (Cardiology) Stark Klein, MD as Consulting Physician (General Surgery) Jacelyn Pi, MD as Consulting Physician (Endocrinology) [name], MD as Consulting Physician (Plastic Surgery) Gery Pray, MD as Consulting Physician (Radiation Oncology) Juanita Craver, MD as Consulting Physician (Gastroenterology) Alda Berthold, DO as Consulting Physician (Neurology) Starlyn Skeans, MD as Consulting Physician (Family Medicine) Sherlynn Stalls, MD as Consulting Physician (Ophthalmology) Salvadore Dom, MD as Consulting Physician (Obstetrics and Gynecology) Raina Mina, RPH-CPP (Pharmacist) Nicholas Lose, MD as Consulting Physician (Hematology and Oncology)  DIAGNOSIS:    ICD-10-CM   1. Malignant neoplasm of upper-outer quadrant of right breast in female, estrogen receptor negative ([place])  C50.411    Z17.1       SUMMARY OF ONCOLOGIC HISTORY: Oncology History  Breast cancer of upper-outer quadrant of right female breast ([place])  07/19/2014 Initial Biopsy   (R) breast needle biopsy (10:00): IDC, DCIS, grade 2. ER-, PR-, HER2+ (ratio 2.42). Ki67 90%.    07/22/2014 Initial Diagnosis   Breast cancer of upper-outer quadrant of right female breast   08/08/2014 - 11/21/2014 Neo-Adjuvant Chemotherapy   Taxotere/Carbo/Herceptin/Perjeta x 6 cycles.    12/12/2014 - 10/02/2015 Chemotherapy   Maintenance Herceptin (to complete 1 year of therapy).    12/27/2014 Surgery   (R) lumpectomy with [facility]): IDC, grade 2, spanning 4 cm, associated high grade DCIS. Negative margins. 1/3 SLN (+).   ER/PR repeated and remain negative.   ypT2, ypN1a: Stage IIB   01/06/2015 Surgery   Bilateral breast mammoplasty ([place]): Right breast path with intralymphatic emboli of ductal carcinoma. Left breast benign.    02/02/2015 Imaging   CT chest: No  findings of metastatic disease in the chest. Right axillary fluid density lesion likely postoperative seroma or hematoma. Small left subpectoral nodes warrant followup attention.   02/02/2015 Imaging   Bone scan: No scintigraphic evidence of osseous metastatic disease   02/16/2015 Surgery   (R) mastectomy and ALND [facility]): Scattered foci of high grade DCIS, scattered intralymphatic tumor emboli. Margins negative. 0/15 LNs.    04/05/2015 - 05/19/2015 Radiation Therapy   Adjuvant XRT ([place]). The right chest wall was treated to 50.4 Gy in 28 fractions at 1.8 Gy per fraction.  The right mastectomy scar was treated to 10 Gy in 5 fractions at 2 Gy per fraction.    12/06/2015 PET scan   Right subpectoral lymph node measuring 9 mm and exhibits malignant range FDG uptake, suspicious for metastatic adenopathy. Subcutaneous nodule within the upper left chest wall exhibits malignant range uptake, which is indeterminate; this is at the site of previous Port-A-Cath and is favored to represent postsurgical change. Indeterminate left adrenal nodules which exhibit mild uptake, favored to represent benign adenoma.   08/11/2017 - 11/03/2017 Chemotherapy   The patient had ado-trastuzumab emtansine (KADCYLA) 320 mg in sodium chloride 0.9 % 250 mL chemo infusion, 3.6 mg/kg = 320 mg, Intravenous, Once, 4 of 5 cycles Administration: 320 mg (08/12/2017), 320 mg (09/01/2017), 320 mg (09/23/2017)   for chemotherapy treatment.     11/18/2017 - 08/30/2020 Chemotherapy   The patient had trastuzumab (HERCEPTIN) 546 mg in sodium chloride 0.9 % 250 mL chemo infusion, 6 mg/kg = 546 mg (100 % of original dose 6 mg/kg), Intravenous,  Once, 2 of 2 cycles Dose modification: 6 mg/kg (original dose 6 mg/kg, Cycle 1, Reason: Provider Judgment) Administration: 546 mg (11/18/2017), 546  mg (12/16/2017) pertuzumab (PERJETA) 420 mg in sodium chloride 0.9 % 250 mL chemo infusion, 420 mg (100 % of original dose 420 mg), Intravenous, Once, 15 of  15 cycles Dose modification: 420 mg (original dose 420 mg, Cycle 18), 420 mg (original dose 420 mg, Cycle 27) Administration: 420 mg (10/06/2018), 420 mg (10/27/2018), 420 mg (11/18/2018), 420 mg (12/08/2018), 420 mg (12/29/2018), 420 mg (01/19/2019), 420 mg (02/16/2019), 420 mg (03/16/2019), 420 mg (04/13/2019), 420 mg (06/08/2019), 420 mg (07/06/2019), 420 mg (08/03/2019), 420 mg (08/31/2019), 420 mg (09/28/2019), 420 mg (05/19/2019) trastuzumab-anns (KANJINTI) 546 mg in sodium chloride 0.9 % 250 mL chemo infusion, 6 mg/kg = 546 mg (100 % of original dose 6 mg/kg), Intravenous,  Once, 30 of 30 cycles Dose modification: 6 mg/kg (original dose 6 mg/kg, Cycle 3, Reason: Other (see comments), Comment: requested step therapy by [facility] insurance), 4 mg/kg (original dose 6 mg/kg, Cycle 9, Reason: Provider Judgment), 6 mg/kg (original dose 6 mg/kg, Cycle 17, Reason: Provider Judgment), 4 mg/kg (original dose 6 mg/kg, Cycle 27, Reason: Provider Judgment), 6 mg/kg (original dose 6 mg/kg, Cycle 27, Reason: Provider Judgment) Administration: 546 mg (01/13/2018), 546 mg (02/10/2018), 546 mg (03/03/2018), 546 mg (03/24/2018), 546 mg (04/14/2018), 546 mg (05/05/2018), 357 mg (05/26/2018), 357 mg (06/09/2018), 357 mg (06/23/2018), 357 mg (07/07/2018), 357 mg (07/21/2018), 357 mg (08/04/2018), 357 mg (08/18/2018), 357 mg (09/01/2018), 546 mg (09/15/2018), 546 mg (10/06/2018), 546 mg (10/27/2018), 546 mg (11/18/2018), 546 mg (12/08/2018), 546 mg (12/29/2018), 546 mg (01/19/2019), 546 mg (02/16/2019), 480 mg (03/16/2019), 483 mg (04/13/2019), 450 mg (06/08/2019), 450 mg (07/06/2019), 450 mg (08/03/2019), 450 mg (08/31/2019), 450 mg (09/28/2019)   for chemotherapy treatment.     12/31/2017 - 09/01/2018 Chemotherapy   The patient had PACLitaxel-protein bound (ABRAXANE) chemo infusion 175 mg, 80 mg/m2 = 175 mg (100 % of original dose 80 mg/m2), Intravenous,  Once, 5 of 10 cycles Dose modification: 80 mg/m2 (original dose 80 mg/m2, Cycle 11) Administration: 175 mg  (07/07/2018), 175 mg (07/21/2018), 175 mg (08/04/2018), 175 mg (08/18/2018), 175 mg (09/01/2018) PACLitaxel-protein bound (ABRAXANE) chemo infusion 200 mg, 100 mg/m2 = 200 mg, Intravenous, Once, 10 of 10 cycles Dose modification: 80 mg/m2 (original dose 100 mg/m2, Cycle 2, Reason: Provider Judgment), 80 mg/m2 (original dose 100 mg/m2, Cycle 2, Reason: Provider Judgment) Administration: 200 mg (12/31/2017), 200 mg (01/06/2018), 175 mg (01/20/2018), 175 mg (01/27/2018), 175 mg (02/10/2018), 175 mg (03/03/2018), 175 mg (03/10/2018), 175 mg (03/24/2018), 175 mg (04/07/2018), 175 mg (04/14/2018), 175 mg (04/28/2018), 175 mg (05/05/2018), 175 mg (05/26/2018), 175 mg (06/09/2018), 175 mg (05/19/2018)   for chemotherapy treatment.     10/05/2020 - 01/18/2021 Chemotherapy   Patient is on Treatment Plan : BREAST METASTATIC fam-trastuzumab deruxtecan-nxki (Enhertu) q21d     02/19/2021 -  Chemotherapy   Patient is on Treatment Plan : BREAST Trastuzumab q21d x 13 cycles     Metastasis to liver ([place])  08/04/2017 Initial Diagnosis   Metastasis to liver ([place])   08/12/2017 - 10/14/2017 Chemotherapy   The patient had ado-trastuzumab emtansine (KADCYLA) 320 mg in sodium chloride 0.9 % 250 mL chemo infusion, 3.6 mg/kg = 320 mg, Intravenous, Once, 4 of 5 cycles Administration: 320 mg (08/12/2017), 320 mg (09/01/2017), 320 mg (09/23/2017)   for chemotherapy treatment.     11/18/2017 - 08/30/2020 Chemotherapy   The patient had trastuzumab (HERCEPTIN) 546 mg in sodium chloride 0.9 % 250 mL chemo infusion, 6 mg/kg = 546 mg (100 % of original dose 6 mg/kg), Intravenous,  Once, 2 of  2 cycles Dose modification: 6 mg/kg (original dose 6 mg/kg, Cycle 1, Reason: Provider Judgment) Administration: 546 mg (11/18/2017), 546 mg (12/16/2017) pertuzumab (PERJETA) 420 mg in sodium chloride 0.9 % 250 mL chemo infusion, 420 mg (100 % of original dose 420 mg), Intravenous, Once, 15 of 15 cycles Dose modification: 420 mg (original dose 420 mg, Cycle 18), 420  mg (original dose 420 mg, Cycle 27) Administration: 420 mg (10/06/2018), 420 mg (10/27/2018), 420 mg (11/18/2018), 420 mg (12/08/2018), 420 mg (12/29/2018), 420 mg (01/19/2019), 420 mg (02/16/2019), 420 mg (03/16/2019), 420 mg (04/13/2019), 420 mg (06/08/2019), 420 mg (07/06/2019), 420 mg (08/03/2019), 420 mg (08/31/2019), 420 mg (09/28/2019), 420 mg (05/19/2019) trastuzumab-anns (KANJINTI) 546 mg in sodium chloride 0.9 % 250 mL chemo infusion, 6 mg/kg = 546 mg (100 % of original dose 6 mg/kg), Intravenous,  Once, 30 of 30 cycles Dose modification: 6 mg/kg (original dose 6 mg/kg, Cycle 3, Reason: Other (see comments), Comment: requested step therapy by [facility] insurance), 4 mg/kg (original dose 6 mg/kg, Cycle 9, Reason: Provider Judgment), 6 mg/kg (original dose 6 mg/kg, Cycle 17, Reason: Provider Judgment), 4 mg/kg (original dose 6 mg/kg, Cycle 27, Reason: Provider Judgment), 6 mg/kg (original dose 6 mg/kg, Cycle 27, Reason: Provider Judgment) Administration: 546 mg (01/13/2018), 546 mg (02/10/2018), 546 mg (03/03/2018), 546 mg (03/24/2018), 546 mg (04/14/2018), 546 mg (05/05/2018), 357 mg (05/26/2018), 357 mg (06/09/2018), 357 mg (06/23/2018), 357 mg (07/07/2018), 357 mg (07/21/2018), 357 mg (08/04/2018), 357 mg (08/18/2018), 357 mg (09/01/2018), 546 mg (09/15/2018), 546 mg (10/06/2018), 546 mg (10/27/2018), 546 mg (11/18/2018), 546 mg (12/08/2018), 546 mg (12/29/2018), 546 mg (01/19/2019), 546 mg (02/16/2019), 480 mg (03/16/2019), 483 mg (04/13/2019), 450 mg (06/08/2019), 450 mg (07/06/2019), 450 mg (08/03/2019), 450 mg (08/31/2019), 450 mg (09/28/2019)   for chemotherapy treatment.       CHIEF COMPLIANT: Herceptin maintenance  INTERVAL HISTORY: Darlene [name] is a 71 y.o. with above-mentioned history of HER-2 positive, estrogen receptor negative breast cancer, was on chemotherapy with Enhertu. She presents to the clinic today for treatment.  She is currently on Herceptin maintenance therapy.  She appears to be tolerating it extremely  well.  ALLERGIES:  is allergic to nsaids, peanut oil, and tape.  MEDICATIONS:  Current Outpatient Medications  Medication Sig Dispense Refill   Accu-Chek Softclix Lancets lancets 1 each by Other route 4 (four) times daily. Test blood sugars 4 x times daily 200 each 1   aspirin EC 81 MG tablet Take 1 tablet (81 mg total) by mouth daily. 90 tablet 3   Blood Glucose Calibration (ACCU-CHEK GUIDE CONTROL) LIQD 1 Bottle by In Vitro route as needed. 1 each 0   Blood Glucose Monitoring Suppl (ACCU-CHEK GUIDE) w/Device KIT 1 each by Does not apply route 4 (four) times daily. Use to test blood sugars 4x a day 1 kit 0   buPROPion (WELLBUTRIN SR) 200 MG 12 hr tablet Take one tablet by mouth once daily. 90 tablet 0   CALCIUM CITRATE-VITAMIN D PO Take 1 tablet by mouth 3 (three) times daily.      carvedilol (COREG) 6.25 MG tablet Take 1 tablet (6.25 mg total) by mouth 2 (two) times daily. 180 tablet 3   cholecalciferol (VITAMIN D) 1000 units tablet Take 1,000 Units by mouth daily.     Continuous Blood Gluc Sensor (FREESTYLE LIBRE 2 SENSOR) MISC      Cyanocobalamin (VITAMIN B-12 PO) Take 1 tablet by mouth every 14 (fourteen) days.     diphenhydrAMINE (BENADRYL) 25 MG tablet Take  25 mg by mouth daily as needed for allergies or sleep.     fam-trastuzumab deruxtecan-nxki (ENHERTU) 100 MG SOLR See admin instructions.     Finerenone (KERENDIA) 10 MG TABS Take 10 mg by mouth daily. 30 tablet 6   glucose blood (ACCU-CHEK GUIDE) test strip Use Accu Chek test strips to check blood sugar 4 times daily. DX:E11.21 400 each 0   insulin aspart (NOVOLOG) 100 UNIT/ML injection Inject into the skin daily as needed for high blood sugar. Per patient, sliding scale with meals as needed     insulin glargine (LANTUS SOLOSTAR) 100 UNIT/ML Solostar Pen Inject 8 Units into the skin daily. 7 mL 3   loratadine (CLARITIN) 10 MG tablet Take 10 mg by mouth as needed for allergies.     losartan (COZAAR) 100 MG tablet 1 tablet      melatonin 5 MG TABS Take 5 mg by mouth at bedtime.     NEXLIZET 180-10 MG TABS TAKE 1 TABLET BY MOUTH DAILY. 30 tablet 11   omeprazole (PRILOSEC) 40 MG capsule Take 1 capsule (40 mg total) by mouth daily. 30 capsule 1   ondansetron (ZOFRAN) 8 MG tablet Take 1 tablet (8 mg total) by mouth every 8 (eight) hours as needed for nausea or vomiting. Start on day 3 after chemo. 30 tablet 2   Pediatric Multivitamins-Iron (FLINTSTONES PLUS IRON) chewable tablet Chew 1 tablet by mouth 2 (two) times daily.     Probiotic Product ([employer] PROBIOTIC PO) Take 1 capsule by mouth daily.      Propylene Glycol (SYSTANE BALANCE OP) Place 1-2 drops into both eyes 3 (three) times daily as needed (for dry eyes).      senna-docusate (SENOKOT-S) 8.6-50 MG tablet Take 1 tablet by mouth daily as needed for moderate constipation.     SURE COMFORT PEN NEEDLES 32G X 4 MM MISC Use as instructed to inject insulin and Victoza daily. 200 each 0   tobramycin (TOBREX) 0.3 % ophthalmic ointment Place 1 application into both eyes as needed (Takes day before eye injections).     vortioxetine HBr (TRINTELLIX) 20 MG TABS tablet Take 1 tablet (20 mg total) by mouth at bedtime. 90 tablet 0   No current facility-administered medications for this visit.    PHYSICAL EXAMINATION: ECOG PERFORMANCE STATUS: 1 - Symptomatic but completely ambulatory  Vitals:   03/19/21 0952  BP: (!) 124/58  Pulse: (!) 57  Resp: 18  Temp: (!) 97.2 F (36.2 C)  SpO2: 98%   Filed Weights   03/19/21 0952  Weight: 168 lb 4.8 oz (76.3 kg)    LABORATORY DATA:  I have reviewed the data as listed CMP Latest Ref Rng & Units 02/08/2021 01/30/2021 01/18/2021  Glucose 70 - 99 mg/dL 135(H) 142(H) 122(H)  BUN 8 - 23 mg/dL 42(H) 33(H) 33(H)  Creatinine 0.44 - 1.00 mg/dL 1.78(H) 2.12(H) 2.18(H)  Sodium 135 - 145 mmol/L 139 141 141  Potassium 3.5 - 5.1 mmol/L 4.3 4.0 4.4  Chloride 98 - 111 mmol/L 107 104 106  CO2 22 - 32 mmol/L _0 Calcium 8.9 - 10.3 mg/dL 8.9  9.6 9.2  Total Protein 6.5 - 8.1 g/dL 5.9(L) - 6.1(L)  Total Bilirubin 0.3 - 1.2 mg/dL 0.3 - 0.4  Alkaline Phos 38 - 126 U/L 40 - 43  AST 15 - 41 U/L 21 - 23  ALT 0 - 44 U/L 10 - 12    Lab Results  Component Value Date   WBC 4.9 03/19/2021  HGB 9.9 (L) 03/19/2021   HCT 29.8 (L) 03/19/2021   MCV 98.3 03/19/2021   PLT 169 03/19/2021   NEUTROABS 3.2 03/19/2021    ASSESSMENT & PLAN:  Breast cancer of upper-outer quadrant of right female breast ([place]) 07/19/2014: Right breast cancer grade 2 IDC ER/PR negative HER2 positive Ki-67 90% treated with neoadjuvant chemo with TCHP x6 cycles followed by Herceptin maintenance, right lumpectomy had residual disease 4 cm 1/3 lymph node positive, ER/PR negative, adjuvant radiation and capecitabine completed 09/27/2015 12/06/2015: Recurrence: Right subpectoral lymph node and subcutaneous nodule, liver mass (biopsy adenocarcinoma consistent with upper GI or pancreaticobiliary primary, HER2 negative, ER negative) July 2019: Metastatic disease: TDM 1 followed by Herceptin maintenance, pertuzumab added 10/06/2018, Abraxane 12/31/2017-09/01/2018, Herceptin and pertuzumab maintenance   Current treatment: Enhertu started 10/05/2020 switched to Herceptin maintenance 02/19/2021 Herceptin toxicities:      02/04/2021: PET CT scan: Complete metabolic response, no evidence of hypermetabolic disease in the liver (mass measured 2.7 cm previously is now 1.5 cm with no uptake)   Acute on chronic constipation: Patient is fine to see gastroenterology.  Instructed to take Senokot with each meal and then take the laxatives as needed.  I also instructed her to drink more water.  Chronic renal failure: Today's creatinine is much better than before. Return to clinic every 4 weeks for Herceptin and every 8 weeks to follow-up with me with labs.    No orders of the defined types were placed in this encounter.  The patient has a good understanding of the overall plan. she agrees  with it. she will call with any problems that may develop before the next visit here.  Total time spent: 30 mins including face to face time and time spent for planning, charting and coordination of care  Rulon Eisenmenger, MD, MPH 03/19/2021  I, Thana Ates, am acting as scribe for Dr. Nicholas Lose.  I have reviewed the above documentation for accuracy and completeness, and I agree with the above.

## 2021-03-19 ENCOUNTER — Inpatient Hospital Stay: Payer: Medicare PPO | Admitting: Hematology and Oncology

## 2021-03-19 ENCOUNTER — Encounter: Payer: Self-pay | Admitting: Gastroenterology

## 2021-03-19 ENCOUNTER — Inpatient Hospital Stay: Payer: Medicare PPO | Attending: Oncology

## 2021-03-19 ENCOUNTER — Inpatient Hospital Stay: Payer: Medicare PPO

## 2021-03-19 ENCOUNTER — Other Ambulatory Visit: Payer: Self-pay

## 2021-03-19 DIAGNOSIS — Z923 Personal history of irradiation: Secondary | ICD-10-CM | POA: Diagnosis not present

## 2021-03-19 DIAGNOSIS — N189 Chronic kidney disease, unspecified: Secondary | ICD-10-CM | POA: Diagnosis not present

## 2021-03-19 DIAGNOSIS — C787 Secondary malignant neoplasm of liver and intrahepatic bile duct: Secondary | ICD-10-CM | POA: Insufficient documentation

## 2021-03-19 DIAGNOSIS — Z79899 Other long term (current) drug therapy: Secondary | ICD-10-CM | POA: Diagnosis not present

## 2021-03-19 DIAGNOSIS — Z171 Estrogen receptor negative status [ER-]: Secondary | ICD-10-CM

## 2021-03-19 DIAGNOSIS — Z9221 Personal history of antineoplastic chemotherapy: Secondary | ICD-10-CM | POA: Insufficient documentation

## 2021-03-19 DIAGNOSIS — C50411 Malignant neoplasm of upper-outer quadrant of right female breast: Secondary | ICD-10-CM

## 2021-03-19 DIAGNOSIS — Z7189 Other specified counseling: Secondary | ICD-10-CM

## 2021-03-19 DIAGNOSIS — Z5112 Encounter for antineoplastic immunotherapy: Secondary | ICD-10-CM | POA: Insufficient documentation

## 2021-03-19 DIAGNOSIS — C50811 Malignant neoplasm of overlapping sites of right female breast: Secondary | ICD-10-CM | POA: Diagnosis not present

## 2021-03-19 DIAGNOSIS — Z7985 Long-term (current) use of injectable non-insulin antidiabetic drugs: Secondary | ICD-10-CM | POA: Insufficient documentation

## 2021-03-19 DIAGNOSIS — Z7982 Long term (current) use of aspirin: Secondary | ICD-10-CM | POA: Diagnosis not present

## 2021-03-19 DIAGNOSIS — K5909 Other constipation: Secondary | ICD-10-CM | POA: Diagnosis not present

## 2021-03-19 DIAGNOSIS — Z95828 Presence of other vascular implants and grafts: Secondary | ICD-10-CM

## 2021-03-19 LAB — CBC WITH DIFFERENTIAL (CANCER CENTER ONLY)
Abs Immature Granulocytes: 0.01 10*3/uL (ref 0.00–0.07)
Basophils Absolute: 0 10*3/uL (ref 0.0–0.1)
Basophils Relative: 0 %
Eosinophils Absolute: 0.1 10*3/uL (ref 0.0–0.5)
Eosinophils Relative: 3 %
HCT: 29.8 % — ABNORMAL LOW (ref 36.0–46.0)
Hemoglobin: 9.9 g/dL — ABNORMAL LOW (ref 12.0–15.0)
Immature Granulocytes: 0 %
Lymphocytes Relative: 22 %
Lymphs Abs: 1.1 10*3/uL (ref 0.7–4.0)
MCH: 32.7 pg (ref 26.0–34.0)
MCHC: 33.2 g/dL (ref 30.0–36.0)
MCV: 98.3 fL (ref 80.0–100.0)
Monocytes Absolute: 0.4 10*3/uL (ref 0.1–1.0)
Monocytes Relative: 9 %
Neutro Abs: 3.2 10*3/uL (ref 1.7–7.7)
Neutrophils Relative %: 66 %
Platelet Count: 169 10*3/uL (ref 150–400)
RBC: 3.03 MIL/uL — ABNORMAL LOW (ref 3.87–5.11)
RDW: 12.9 % (ref 11.5–15.5)
WBC Count: 4.9 10*3/uL (ref 4.0–10.5)
nRBC: 0 % (ref 0.0–0.2)

## 2021-03-19 LAB — CMP (CANCER CENTER ONLY)
ALT: 12 U/L (ref 0–44)
AST: 22 U/L (ref 15–41)
Albumin: 3.7 g/dL (ref 3.5–5.0)
Alkaline Phosphatase: 39 U/L (ref 38–126)
Anion gap: 4 — ABNORMAL LOW (ref 5–15)
BUN: 47 mg/dL — ABNORMAL HIGH (ref 8–23)
CO2: 29 mmol/L (ref 22–32)
Calcium: 9.4 mg/dL (ref 8.9–10.3)
Chloride: 108 mmol/L (ref 98–111)
Creatinine: 1.59 mg/dL — ABNORMAL HIGH (ref 0.44–1.00)
GFR, Estimated: 35 mL/min — ABNORMAL LOW (ref >60)
Glucose, Bld: 117 mg/dL — ABNORMAL HIGH (ref 70–99)
Potassium: 4.8 mmol/L (ref 3.5–5.1)
Sodium: 141 mmol/L (ref 135–145)
Total Bilirubin: 0.4 mg/dL (ref 0.3–1.2)
Total Protein: 6.1 g/dL — ABNORMAL LOW (ref 6.5–8.1)

## 2021-03-19 LAB — CEA (IN HOUSE-CHCC): CEA (CHCC-In House): 3.15 ng/mL (ref 0.00–5.00)

## 2021-03-19 MED ORDER — HEPARIN SOD (PORK) LOCK FLUSH 100 UNIT/ML IV SOLN
500.0000 [IU] | Freq: Once | INTRAVENOUS | Status: AC | PRN
  Administered 2021-03-19: 500 [IU]

## 2021-03-19 MED ORDER — ACETAMINOPHEN 325 MG PO TABS
650.0000 mg | ORAL_TABLET | Freq: Once | ORAL | Status: AC
  Administered 2021-03-19: 650 mg via ORAL
  Filled 2021-03-19: qty 2

## 2021-03-19 MED ORDER — SODIUM CHLORIDE 0.9% FLUSH
10.0000 mL | INTRAVENOUS | Status: DC | PRN
  Administered 2021-03-19: 10 mL

## 2021-03-19 MED ORDER — SODIUM CHLORIDE 0.9 % IV SOLN: Freq: Once | INTRAVENOUS | Status: AC

## 2021-03-19 MED ORDER — DIPHENHYDRAMINE HCL 25 MG PO CAPS
25.0000 mg | ORAL_CAPSULE | Freq: Once | ORAL | Status: AC
  Administered 2021-03-19: 25 mg via ORAL
  Filled 2021-03-19: qty 1

## 2021-03-19 MED ORDER — SODIUM CHLORIDE 0.9% FLUSH
10.0000 mL | Freq: Once | INTRAVENOUS | Status: AC
  Administered 2021-03-19: 10 mL

## 2021-03-19 MED ORDER — TRASTUZUMAB-ANNS CHEMO 150 MG IV SOLR
6.0000 mg/kg | Freq: Once | INTRAVENOUS | Status: AC
  Administered 2021-03-19: 441 mg via INTRAVENOUS
  Filled 2021-03-19: qty 21

## 2021-03-19 NOTE — Assessment & Plan Note (Signed)
07/19/2014: Right breast cancer grade 2 IDC ER/PR negative HER2 positive Ki-67 90% treated with neoadjuvant chemo with TCHP x6 cycles followed by Herceptin maintenance, right lumpectomy had residual disease 4 cm 1/3 lymph node positive, ER/PR negative, adjuvant radiation and capecitabine completed 09/27/2015 12/06/2015: Recurrence: Right subpectoral lymph node and subcutaneous nodule, liver mass (biopsy adenocarcinoma consistent with upper GI or pancreaticobiliary primary, HER2 negative, ER negative) July 2019: Metastatic disease: TDM 1 followed by Herceptin maintenance, pertuzumab added 10/06/2018, Abraxane 12/31/2017-09/01/2018, Herceptin and pertuzumab maintenance  Current treatment: Enhertu started 10/05/2020  Enhertu toxicities: Hair loss, cytopenias especially anemia    The plan was to switch her to Herceptin maintenance.  02/04/2021: PET CT scan: Complete metabolic response, no evidence of hypermetabolic disease in the liver (mass measured 2.7 cm previously is now 1.5 cm with no uptake)  Return to clinic every 4 weeks for Herceptin and every 8 weeks to follow-up with me with labs.

## 2021-03-19 NOTE — Patient Instructions (Signed)
[facility]  Discharge Instructions: °Thank you for choosing [facility] to provide your oncology and hematology care.  ° °If you have a lab appointment with the Cancer Center, please go directly to the Cancer Center and check in at the registration area. °  °Wear comfortable clothing and clothing appropriate for easy access to any Portacath or PICC line.  ° °We strive to give you quality time with your provider. You may need to reschedule your appointment if you arrive late (15 or more minutes).  Arriving late affects you and other patients whose appointments are after yours.  Also, if you miss three or more appointments without notifying the office, you may be dismissed from the clinic at the provider’s discretion.    °  °For prescription refill requests, have your pharmacy contact our office and allow 72 hours for refills to be completed.   ° °Today you received the following chemotherapy and/or immunotherapy agents: Kanjinti °  °To help prevent nausea and vomiting after your treatment, we encourage you to take your nausea medication as directed. ° °BELOW ARE SYMPTOMS THAT SHOULD BE REPORTED IMMEDIATELY: °*FEVER GREATER THAN 100.4 F (38 °C) OR HIGHER °*CHILLS OR SWEATING °*NAUSEA AND VOMITING THAT IS NOT CONTROLLED WITH YOUR NAUSEA MEDICATION °*UNUSUAL SHORTNESS OF BREATH °*UNUSUAL BRUISING OR BLEEDING °*URINARY PROBLEMS (pain or burning when urinating, or frequent urination) °*BOWEL PROBLEMS (unusual diarrhea, constipation, pain near the anus) °TENDERNESS IN MOUTH AND THROAT WITH OR WITHOUT PRESENCE OF ULCERS (sore throat, sores in mouth, or a toothache) °UNUSUAL RASH, SWELLING OR PAIN  °UNUSUAL VAGINAL DISCHARGE OR ITCHING  ° °Items with * indicate a potential emergency and should be followed up as soon as possible or go to the Emergency Department if any problems should occur. ° °Please show the CHEMOTHERAPY ALERT CARD or IMMUNOTHERAPY ALERT CARD at check-in to the  Emergency Department and triage nurse. ° °Should you have questions after your visit or need to cancel or reschedule your appointment, please contact [facility]  Dept: [phone]  and follow the prompts.  Office hours are 8:00 a.m. to 4:30 p.m. Monday - Friday. Please note that voicemails left after 4:00 p.m. may not be returned until the following business day.  We are closed weekends and major holidays. You have access to a nurse at all times for urgent questions. Please call the main number to the clinic Dept: [phone] and follow the prompts. ° ° °For any non-urgent questions, you may also contact your provider using MyChart. We now offer e-Visits for anyone 18 and older to request care online for non-urgent symptoms. For details visit [email or web address]. °  °Also download the MyChart app! Go to the app store, search "MyChart", open the app, select , and log in with your MyChart username and password. ° °Due to Covid, a mask is required upon entering the hospital/clinic. If you do not have a mask, one will be given to you upon arrival. For doctor visits, patients may have 1 support person aged 18 or older with them. For treatment visits, patients cannot have anyone with them due to current Covid guidelines and our immunocompromised population.  ° °

## 2021-03-20 DIAGNOSIS — E118 Type 2 diabetes mellitus with unspecified complications: Secondary | ICD-10-CM | POA: Diagnosis not present

## 2021-03-20 DIAGNOSIS — R269 Unspecified abnormalities of gait and mobility: Secondary | ICD-10-CM | POA: Diagnosis not present

## 2021-03-20 DIAGNOSIS — F339 Major depressive disorder, recurrent, unspecified: Secondary | ICD-10-CM | POA: Diagnosis not present

## 2021-03-20 DIAGNOSIS — M25572 Pain in left ankle and joints of left foot: Secondary | ICD-10-CM | POA: Diagnosis not present

## 2021-03-20 DIAGNOSIS — I10 Essential (primary) hypertension: Secondary | ICD-10-CM | POA: Diagnosis not present

## 2021-03-20 DIAGNOSIS — M25671 Stiffness of right ankle, not elsewhere classified: Secondary | ICD-10-CM | POA: Diagnosis not present

## 2021-03-20 DIAGNOSIS — Z9181 History of falling: Secondary | ICD-10-CM | POA: Diagnosis not present

## 2021-03-20 DIAGNOSIS — M6259 Muscle wasting and atrophy, not elsewhere classified, multiple sites: Secondary | ICD-10-CM | POA: Diagnosis not present

## 2021-03-21 DIAGNOSIS — E1165 Type 2 diabetes mellitus with hyperglycemia: Secondary | ICD-10-CM | POA: Diagnosis not present

## 2021-03-23 DIAGNOSIS — M25671 Stiffness of right ankle, not elsewhere classified: Secondary | ICD-10-CM | POA: Diagnosis not present

## 2021-03-23 DIAGNOSIS — E118 Type 2 diabetes mellitus with unspecified complications: Secondary | ICD-10-CM | POA: Diagnosis not present

## 2021-03-23 DIAGNOSIS — M6259 Muscle wasting and atrophy, not elsewhere classified, multiple sites: Secondary | ICD-10-CM | POA: Diagnosis not present

## 2021-03-23 DIAGNOSIS — Z9181 History of falling: Secondary | ICD-10-CM | POA: Diagnosis not present

## 2021-03-23 DIAGNOSIS — R269 Unspecified abnormalities of gait and mobility: Secondary | ICD-10-CM | POA: Diagnosis not present

## 2021-03-23 DIAGNOSIS — I10 Essential (primary) hypertension: Secondary | ICD-10-CM | POA: Diagnosis not present

## 2021-03-23 DIAGNOSIS — M25572 Pain in left ankle and joints of left foot: Secondary | ICD-10-CM | POA: Diagnosis not present

## 2021-03-23 DIAGNOSIS — F339 Major depressive disorder, recurrent, unspecified: Secondary | ICD-10-CM | POA: Diagnosis not present

## 2021-03-27 DIAGNOSIS — E118 Type 2 diabetes mellitus with unspecified complications: Secondary | ICD-10-CM | POA: Diagnosis not present

## 2021-03-27 DIAGNOSIS — R269 Unspecified abnormalities of gait and mobility: Secondary | ICD-10-CM | POA: Diagnosis not present

## 2021-03-27 DIAGNOSIS — F339 Major depressive disorder, recurrent, unspecified: Secondary | ICD-10-CM | POA: Diagnosis not present

## 2021-03-27 DIAGNOSIS — M25671 Stiffness of right ankle, not elsewhere classified: Secondary | ICD-10-CM | POA: Diagnosis not present

## 2021-03-27 DIAGNOSIS — Z9181 History of falling: Secondary | ICD-10-CM | POA: Diagnosis not present

## 2021-03-27 DIAGNOSIS — M25572 Pain in left ankle and joints of left foot: Secondary | ICD-10-CM | POA: Diagnosis not present

## 2021-03-27 DIAGNOSIS — M6259 Muscle wasting and atrophy, not elsewhere classified, multiple sites: Secondary | ICD-10-CM | POA: Diagnosis not present

## 2021-03-27 DIAGNOSIS — I10 Essential (primary) hypertension: Secondary | ICD-10-CM | POA: Diagnosis not present

## 2021-03-30 DIAGNOSIS — M25671 Stiffness of right ankle, not elsewhere classified: Secondary | ICD-10-CM | POA: Diagnosis not present

## 2021-03-30 DIAGNOSIS — F339 Major depressive disorder, recurrent, unspecified: Secondary | ICD-10-CM | POA: Diagnosis not present

## 2021-03-30 DIAGNOSIS — M25572 Pain in left ankle and joints of left foot: Secondary | ICD-10-CM | POA: Diagnosis not present

## 2021-03-30 DIAGNOSIS — Z9181 History of falling: Secondary | ICD-10-CM | POA: Diagnosis not present

## 2021-03-30 DIAGNOSIS — E118 Type 2 diabetes mellitus with unspecified complications: Secondary | ICD-10-CM | POA: Diagnosis not present

## 2021-03-30 DIAGNOSIS — M6259 Muscle wasting and atrophy, not elsewhere classified, multiple sites: Secondary | ICD-10-CM | POA: Diagnosis not present

## 2021-03-30 DIAGNOSIS — R269 Unspecified abnormalities of gait and mobility: Secondary | ICD-10-CM | POA: Diagnosis not present

## 2021-03-30 DIAGNOSIS — I10 Essential (primary) hypertension: Secondary | ICD-10-CM | POA: Diagnosis not present

## 2021-03-30 NOTE — Progress Notes (Signed)
Error

## 2021-04-03 DIAGNOSIS — R269 Unspecified abnormalities of gait and mobility: Secondary | ICD-10-CM | POA: Diagnosis not present

## 2021-04-03 DIAGNOSIS — Z9181 History of falling: Secondary | ICD-10-CM | POA: Diagnosis not present

## 2021-04-03 DIAGNOSIS — F339 Major depressive disorder, recurrent, unspecified: Secondary | ICD-10-CM | POA: Diagnosis not present

## 2021-04-03 DIAGNOSIS — E118 Type 2 diabetes mellitus with unspecified complications: Secondary | ICD-10-CM | POA: Diagnosis not present

## 2021-04-03 DIAGNOSIS — M25572 Pain in left ankle and joints of left foot: Secondary | ICD-10-CM | POA: Diagnosis not present

## 2021-04-03 DIAGNOSIS — M25671 Stiffness of right ankle, not elsewhere classified: Secondary | ICD-10-CM | POA: Diagnosis not present

## 2021-04-03 DIAGNOSIS — I10 Essential (primary) hypertension: Secondary | ICD-10-CM | POA: Diagnosis not present

## 2021-04-03 DIAGNOSIS — M6259 Muscle wasting and atrophy, not elsewhere classified, multiple sites: Secondary | ICD-10-CM | POA: Diagnosis not present

## 2021-04-06 DIAGNOSIS — E113513 Type 2 diabetes mellitus with proliferative diabetic retinopathy with macular edema, bilateral: Secondary | ICD-10-CM | POA: Diagnosis not present

## 2021-04-06 DIAGNOSIS — H3582 Retinal ischemia: Secondary | ICD-10-CM | POA: Diagnosis not present

## 2021-04-06 DIAGNOSIS — H31093 Other chorioretinal scars, bilateral: Secondary | ICD-10-CM | POA: Diagnosis not present

## 2021-04-07 ENCOUNTER — Encounter: Payer: Self-pay | Admitting: Gastroenterology

## 2021-04-07 DIAGNOSIS — E1165 Type 2 diabetes mellitus with hyperglycemia: Secondary | ICD-10-CM | POA: Diagnosis not present

## 2021-04-10 DIAGNOSIS — M6259 Muscle wasting and atrophy, not elsewhere classified, multiple sites: Secondary | ICD-10-CM | POA: Diagnosis not present

## 2021-04-10 DIAGNOSIS — M25671 Stiffness of right ankle, not elsewhere classified: Secondary | ICD-10-CM | POA: Diagnosis not present

## 2021-04-10 DIAGNOSIS — Z9181 History of falling: Secondary | ICD-10-CM | POA: Diagnosis not present

## 2021-04-10 DIAGNOSIS — E118 Type 2 diabetes mellitus with unspecified complications: Secondary | ICD-10-CM | POA: Diagnosis not present

## 2021-04-10 DIAGNOSIS — R269 Unspecified abnormalities of gait and mobility: Secondary | ICD-10-CM | POA: Diagnosis not present

## 2021-04-10 DIAGNOSIS — F339 Major depressive disorder, recurrent, unspecified: Secondary | ICD-10-CM | POA: Diagnosis not present

## 2021-04-10 DIAGNOSIS — E1165 Type 2 diabetes mellitus with hyperglycemia: Secondary | ICD-10-CM | POA: Diagnosis not present

## 2021-04-10 DIAGNOSIS — I10 Essential (primary) hypertension: Secondary | ICD-10-CM | POA: Diagnosis not present

## 2021-04-10 DIAGNOSIS — M25572 Pain in left ankle and joints of left foot: Secondary | ICD-10-CM | POA: Diagnosis not present

## 2021-04-11 ENCOUNTER — Other Ambulatory Visit: Payer: Self-pay

## 2021-04-11 ENCOUNTER — Encounter: Payer: Self-pay | Admitting: Gastroenterology

## 2021-04-11 ENCOUNTER — Ambulatory Visit: Payer: Medicare PPO | Admitting: Gastroenterology

## 2021-04-11 ENCOUNTER — Other Ambulatory Visit (INDEPENDENT_AMBULATORY_CARE_PROVIDER_SITE_OTHER): Payer: Medicare PPO

## 2021-04-11 ENCOUNTER — Telehealth: Payer: Self-pay | Admitting: Hematology and Oncology

## 2021-04-11 ENCOUNTER — Ambulatory Visit (INDEPENDENT_AMBULATORY_CARE_PROVIDER_SITE_OTHER)
Admission: RE | Admit: 2021-04-11 | Discharge: 2021-04-11 | Disposition: A | Discharge status: Home / Self Care | Payer: Medicare PPO | Source: Ambulatory Visit | Attending: Gastroenterology | Admitting: Gastroenterology

## 2021-04-11 VITALS — Ht 69.0 in | Wt 172.0 lb

## 2021-04-11 DIAGNOSIS — K5900 Constipation, unspecified: Secondary | ICD-10-CM

## 2021-04-11 LAB — TSH: TSH: 1.45 u[IU]/mL (ref 0.35–5.50)

## 2021-04-11 NOTE — Progress Notes (Signed)
? ?Referring Provider: Isaac Bliss, [place]* ?Primary Care Physician:  Isaac Bliss, Rayford Halsted, MD ? ? ?Reason for Consultation:  Constipation ? ? ?IMPRESSION:  ?4-5 months of constipation developing after starting Enhertu, but constipation persists despite discontinuation of chemotherapy. Associated symptoms that suggest pelvic floor dysfunction. No alarm features.  ? ?PLAN: ?- TSH (last seen in Epic in 2019) ?- Add a daily dose of Metamucil or Benefiber ?- Abdominal x-rays to evaluate for overflow ?- Bowel purge ?- Start Miralax 17 g daily after bowel purge ?- Referral to [facility] for biofeedback and pelvic floor PT ?- Colonoscopy due 2025, earlier with alarm features ?- Follow-up after completing physical therapy ?- Consider empiric Xifaxan 550 mg TID x 14 days if no response to biofeedback/pelvic floor PT ? ? ?HPI: Darlene [name] is a 71 y.o. female referred by Dr. Isaac Bliss for constipation. The history is obtained through the patient and review of her electronic health record.  She has metastatic HER-2 positive, estrogen receptor negative breast cancer on Herceptin maintenance, diabetes with diabetic neuropathy and retinopathy, hypertension, sleep apnea, depression, obesity and some renal insufficiency. Works with Dr. Leafy Ro for weight loss. 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  ? ?Developed constipation in November when she started chemotherapy with Enhertu. Despite discontinuation of this treatment a couple of months ago, she continues to have difficulty with constipation. Stool caliber fluctuates from very lumpy to watery stools.  Associated gas and bloating. Sense of complete evacuation. However, she has to strain, lean forward to have a bowel movement and has required manual assistance with defecation. No sensation of anorectal blockage.  ? ?Had regular bowel movements prior to that time. ? ?Tried Senna, Miralax, suppositories. Currently using Senna 2 QHS and 1 QAM with  improvement.  ?No significant change with increase fiber in the diet or daily water intake.  ? ?Drinks alcohol 3 times a week. No illicit drugs. No smoking history.  ? ?Notes psychosocial stressors - husband has recently been in treatment for prostate cancer with complications related to seed placement. Had Covid 8 weeks ago.  ? ?PET CT 02/02/21 shows cholelithiasis and nonobstructing left nephrolithiasis ? ?Report from Colonoscopy with Dr. Collene Mares 2015 was reviewed today and showed pancolonic diverticulosis. She was not having constipation at that time. Surveillance colonoscopy recommended in 10 years.  ? ?She also reports a history of acid reflux. She can't remember if she had an upper endoscopy. However, she remembers Dr. Collene Mares telling her that she had reflux.  ? ?There is no known family history of colon cancer or polyps. No family history of stomach cancer or other GI malignancy. No family history of inflammatory bowel disease or celiac.  ? ? ?Past Medical History:  ?Diagnosis Date  ? Anemia   ? hx of - during 1st round chemo  ? Anxiety   ? Arthritis   ? in fingers, shoulders  ? Back pain   ? Balance problem   ? Breast cancer ([place])   ? Breast cancer of upper-outer quadrant of right female breast ([place]) 07/22/2014  ? Bunion, right foot   ? CHF (congestive heart failure) ([place])   ? Chronic kidney disease   ? creatininei levels high per pt   ? Clumsiness   ? Constipation   ? Depression   ? Diabetes mellitus without complication ([place])   ? 20+ years type 2   ? Diabetic retinopathy ([place])   ? torn retina  ? Eating disorder   ? binge eating  ?  Epistaxis 08/26/2014  ? Fatty liver   ? Foot drop, left foot   ? HCAP (healthcare-associated pneumonia) 12/18/2014  ? Heart murmur   ? never had any problems  ? History of radiation therapy 04/05/15-05/19/15  ? right chest wall was treated to 50.4 Gy in 28 fractions, right mastectomy scar was treated to 10 Gy in 5 fractions  ? Hyperlipidemia   ? Hypertension   ? Joint pain   ? Multiple food  allergies   ? Neuromuscular disorder ([place])   ? diabetic neuropathy  ? Obesity   ? Obstructive sleep apnea on CPAP   ? does not use cpap all the time has lost 160 lbs   ? Ovarian cyst rupture   ? possible  ? Pneumonia   ? Pneumonia 11/2014  ? Radiation 04/05/15-05/19/15  ? right chest wall 50.4 Gy, mastectomy scar 10 Gy  ? Sleep apnea   ? Thyroid nodule 06/2014  ? ? ?Past Surgical History:  ?Procedure Laterality Date  ? APPENDECTOMY    ? BIOPSY THYROID Bilateral 06/2014  ? 2 nodules - Benign   ? BREAST LUMPECTOMY WITH RADIOACTIVE SEED AND SENTINEL LYMPH NODE BIOPSY Right 12/27/2014  ? Procedure: BREAST LUMPECTOMY WITH RADIOACTIVE SEED AND SENTINEL LYMPH NODE BIOPSY;  Surgeon: Stark Klein, MD;  Location: [facility];  Service: General;  Laterality: Right;  ? BREAST REDUCTION SURGERY Bilateral 01/06/2015  ? Procedure: BILATERAL BREAST REDUCTION, RIGHT ONCOPLASTIC RECONSTRUCTION),LEFT BREAST REDUCTION FOR SYMMETRY;  Surgeon: [name], MD;  Location: [facility];  Service: Plastics;  Laterality: Bilateral;  ? BREATH TEK H PYLORI N/A 09/15/2012  ? Procedure: BREATH TEK H PYLORI;  Surgeon: Pedro Earls, MD;  Location: [facility];  Service: General;  Laterality: N/A;  ? BUNIONECTOMY Left 12/2013  ? CARPAL TUNNEL RELEASE Right   ? CARPAL TUNNEL RELEASE Left   ? CATARACT EXTRACTION    ? 6/19  ? COLONOSCOPY    ? DUPUYTREN CONTRACTURE RELEASE    ? GASTRIC ROUX-EN-Y N/A 11/02/2012  ? Procedure: LAPAROSCOPIC ROUX-EN-Y GASTRIC;  Surgeon: Pedro Earls, MD;  Location: [facility];  Service: General;  Laterality: N/A;  ? IR GENERIC HISTORICAL  03/18/2016  ? IR US GUIDE VASC ACCESS LEFT 03/18/2016 Markus Daft, MD [facility]  ? IR GENERIC HISTORICAL  03/18/2016  ? IR FLUORO GUIDE CV LINE LEFT 03/18/2016 Markus Daft, MD [facility]  ? LAPAROSCOPIC APPENDECTOMY N/A 07/22/2015  ? Procedure: APPENDECTOMY LAPAROSCOPIC;  Surgeon: Michael Boston, MD;  Location[facility];  Service: General;  Laterality: N/A;  ? MASTECTOMY Right  02/15/2015  ? modified  ? MODIFIED MASTECTOMY Right 02/15/2015  ? Procedure: RIGHT MODIFIED RADICAL MASTECTOMY;  Surgeon: Stark Klein, MD;  Location: [facility];  Service: General;  Laterality: Right;  ? ORIF WRIST FRACTURE Left 05/11/2019  ? Procedure: OPEN REDUCTION INTERNAL FIXATION (ORIF) left distal radius fracture;  Surgeon: Renette Butters, MD;  Location: [facility];  Service: Orthopedics;  Laterality: Left;  with block  ? PORT-A-CATH REMOVAL Left 10/10/2015  ? Procedure: PORT REMOVAL;  Surgeon: Stark Klein, MD;  Location: [facility];  Service: General;  Laterality: Left;  ? PORTACATH PLACEMENT Left 08/02/2014  ? Procedure: INSERTION PORT-A-CATH;  Surgeon: Stark Klein, MD;  Location: [facility];  Service: General;  Laterality: Left;  ? PORTACATH PLACEMENT N/A 11/05/2016  ? Procedure: INSERTION PORT-A-CATH;  Surgeon: Stark Klein, MD;  Location: [place];  Service: General;  Laterality: N/A;  ? RETINAL LASER PROCEDURE    ? retina tear  on left eye  ? ROTATOR CUFF REPAIR Right 2005  ? SCAR REVISION Right 12/08/2015  ? Procedure: COMPLEX REPAIR RIGHT CHEST 10-15CM;  Surgeon: [name], MD;  Location: [facility] [place];  Service: Plastics;  Laterality: Right;  ? TOE SURGERY  1970s  ? bone spur  ? TRIGGER FINGER RELEASE    ? x3  ? ? ? ?Current Outpatient Medications  ?Medication Sig Dispense Refill  ? Accu-Chek Softclix Lancets lancets 1 each by Other route 4 (four) times daily. Test blood sugars 4 x times daily 200 each 1  ? aspirin EC 81 MG tablet Take 1 tablet (81 mg total) by mouth daily. 90 tablet 3  ? Blood Glucose Calibration (ACCU-CHEK GUIDE CONTROL) LIQD 1 Bottle by In Vitro route as needed. 1 each 0  ? Blood Glucose Monitoring Suppl (ACCU-CHEK GUIDE) w/Device KIT 1 each by Does not apply route 4 (four) times daily. Use to test blood sugars 4x a day 1 kit 0  ? buPROPion (WELLBUTRIN SR) 200 MG 12 hr tablet Take one tablet by mouth once daily. 90 tablet 0  ? CALCIUM CITRATE-VITAMIN D PO Take 1  tablet by mouth 3 (three) times daily.     ? carvedilol (COREG) 6.25 MG tablet Take 1 tablet (6.25 mg total) by mouth 2 (two) times daily. 180 tablet 3  ? cholecalciferol (VITAMIN D) 1000 units tablet Take 1,000

## 2021-04-11 NOTE — Patient Instructions (Addendum)
Please stop in the lab. I have recommended checking your thyroid today. ? ?I have also recommended abdominal x-rays today.  ? ?We will arrange for anorectal manometry.  ? ?I recommend that you eat at least 25-30 grams of fiber daily and drink at least 64 ounces of water daily. You will want to gradually increase the fiber in your diet to avoid bloating. You may increase the fiber through diet and through fiber supplements including psyllium and methycellulose.  ? ?Natural laxatives include prunes, apples, apricots, cherries, peaches, pears, aloe, rhubarb, kiwi, bananas, mango, papaya, and watermelon. In particular, two kiwi a day has been show to cause less likely to cause bloating than prunes or psyllium. ? ?I recommend a bowel purge to clean out your bowels. Please do the following: ?Purchase a bottle of Miralax over the counter as well as a box of 5 mg dulcolax tablets. ?Take 4 dulcolax tablets. ?Wait 1 hour. ?You will then drink 6-8 capfuls of Miralax mixed in an adequate amount of water/juice/gatorade (you may choose which of these liquids to drink) over the next 2-3 hours. ?You should expect results within 1 to 6 hours after completing the bowel purge.  ? ?After the bowel purge, start taking Miralax 17 grams daily.  ? ?I would like for you to see a physical therapist for biofeedback and pelvic floor PT after the anorectal manometry.  ? ?I would recommend the [email or web address] website for additional information.  ?

## 2021-04-11 NOTE — Telephone Encounter (Signed)
Rescheduled appointment per nurse orders. Patient aware.  ?

## 2021-04-13 ENCOUNTER — Telehealth: Payer: Self-pay | Admitting: *Deleted

## 2021-04-13 ENCOUNTER — Other Ambulatory Visit: Payer: Self-pay | Admitting: *Deleted

## 2021-04-13 ENCOUNTER — Encounter: Payer: Self-pay | Admitting: *Deleted

## 2021-04-13 DIAGNOSIS — Z9181 History of falling: Secondary | ICD-10-CM | POA: Diagnosis not present

## 2021-04-13 DIAGNOSIS — M6259 Muscle wasting and atrophy, not elsewhere classified, multiple sites: Secondary | ICD-10-CM | POA: Diagnosis not present

## 2021-04-13 DIAGNOSIS — I10 Essential (primary) hypertension: Secondary | ICD-10-CM | POA: Diagnosis not present

## 2021-04-13 DIAGNOSIS — E118 Type 2 diabetes mellitus with unspecified complications: Secondary | ICD-10-CM | POA: Diagnosis not present

## 2021-04-13 DIAGNOSIS — M25671 Stiffness of right ankle, not elsewhere classified: Secondary | ICD-10-CM | POA: Diagnosis not present

## 2021-04-13 DIAGNOSIS — M25572 Pain in left ankle and joints of left foot: Secondary | ICD-10-CM | POA: Diagnosis not present

## 2021-04-13 DIAGNOSIS — K5900 Constipation, unspecified: Secondary | ICD-10-CM

## 2021-04-13 DIAGNOSIS — F339 Major depressive disorder, recurrent, unspecified: Secondary | ICD-10-CM | POA: Diagnosis not present

## 2021-04-13 DIAGNOSIS — R269 Unspecified abnormalities of gait and mobility: Secondary | ICD-10-CM | POA: Diagnosis not present

## 2021-04-13 NOTE — Telephone Encounter (Signed)
Scheduled patient for anorectal manometry for 08/01/21 @ 12:30 pm. Sent patient mychart message with the information.  ?

## 2021-04-13 NOTE — Progress Notes (Signed)
Thanks for the follow-up. KLB ?

## 2021-04-16 ENCOUNTER — Other Ambulatory Visit: Payer: Self-pay

## 2021-04-16 ENCOUNTER — Inpatient Hospital Stay: Payer: Medicare PPO | Attending: Oncology

## 2021-04-16 VITALS — BP 128/59 | HR 63 | Temp 98.6°F | Resp 18 | Wt 171.0 lb

## 2021-04-16 DIAGNOSIS — C50811 Malignant neoplasm of overlapping sites of right female breast: Secondary | ICD-10-CM | POA: Diagnosis not present

## 2021-04-16 DIAGNOSIS — Z7189 Other specified counseling: Secondary | ICD-10-CM

## 2021-04-16 DIAGNOSIS — Z5112 Encounter for antineoplastic immunotherapy: Secondary | ICD-10-CM | POA: Diagnosis not present

## 2021-04-16 DIAGNOSIS — C50411 Malignant neoplasm of upper-outer quadrant of right female breast: Secondary | ICD-10-CM

## 2021-04-16 MED ORDER — DIPHENHYDRAMINE HCL 25 MG PO CAPS
25.0000 mg | ORAL_CAPSULE | Freq: Once | ORAL | Status: AC
  Administered 2021-04-16: 25 mg via ORAL
  Filled 2021-04-16: qty 1

## 2021-04-16 MED ORDER — TRASTUZUMAB-ANNS CHEMO 150 MG IV SOLR
6.0000 mg/kg | Freq: Once | INTRAVENOUS | Status: AC
  Administered 2021-04-16: 441 mg via INTRAVENOUS
  Filled 2021-04-16: qty 21

## 2021-04-16 MED ORDER — SODIUM CHLORIDE 0.9% FLUSH
10.0000 mL | INTRAVENOUS | Status: DC | PRN
  Administered 2021-04-16: 10 mL

## 2021-04-16 MED ORDER — ACETAMINOPHEN 325 MG PO TABS
650.0000 mg | ORAL_TABLET | Freq: Once | ORAL | Status: AC
  Administered 2021-04-16: 650 mg via ORAL
  Filled 2021-04-16: qty 2

## 2021-04-16 MED ORDER — SODIUM CHLORIDE 0.9 % IV SOLN: Freq: Once | INTRAVENOUS | Status: AC

## 2021-04-16 MED ORDER — HEPARIN SOD (PORK) LOCK FLUSH 100 UNIT/ML IV SOLN
500.0000 [IU] | Freq: Once | INTRAVENOUS | Status: AC | PRN
  Administered 2021-04-16: 500 [IU]

## 2021-04-16 NOTE — Patient Instructions (Signed)
[facility]COL[facility]  Discharge Instructions: Thank you for choosing [facility] to provide your oncology and hematology care.   If you have a lab appointment with the Cancer Center, please go directly to the Cancer Center and check in at the registration area.   Wear comfortable clothing and clothing appropriate for easy access to any Portacath or PICC line.   We strive to give you quality time with your provider. You may need to reschedule your appointment if you arrive late (15 or more minutes).  Arriving late affects you and other patients whose appointments are after yours.  Also, if you miss three or more appointments without notifying the office, you may be dismissed from the clinic at the provider's discretion.      For prescription refill requests, have your pharmacy contact our office and allow 72 hours for refills to be completed.    Today you received the following chemotherapy and/or immunotherapy agents herceptin      To help prevent nausea and vomiting after your treatment, we encourage you to take your nausea medication as directed.  BELOW ARE SYMPTOMS THAT SHOULD BE REPORTED IMMEDIATELY: *FEVER GREATER THAN 100.4 F (38 C) OR HIGHER *CHILLS OR SWEATING *NAUSEA AND VOMITING THAT IS NOT CONTROLLED WITH YOUR NAUSEA MEDICATION *UNUSUAL SHORTNESS OF BREATH *UNUSUAL BRUISING OR BLEEDING *URINARY PROBLEMS (pain or burning when urinating, or frequent urination) *BOWEL PROBLEMS (unusual diarrhea, constipation, pain near the anus) TENDERNESS IN MOUTH AND THROAT WITH OR WITHOUT PRESENCE OF ULCERS (sore throat, sores in mouth, or a toothache) UNUSUAL RASH, SWELLING OR PAIN  UNUSUAL VAGINAL DISCHARGE OR ITCHING   Items with * indicate a potential emergency and should be followed up as soon as possible or go to the Emergency Department if any problems should occur.  Please show the CHEMOTHERAPY ALERT CARD or IMMUNOTHERAPY ALERT CARD at check-in to  the Emergency Department and triage nurse.  Should you have questions after your visit or need to cancel or reschedule your appointment, please contact [facility]  Dept: [phone]  and follow the prompts.  Office hours are 8:00 a.m. to 4:30 p.m. Monday - Friday. Please note that voicemails left after 4:00 p.m. may not be returned until the following business day.  We are closed weekends and major holidays. You have access to a nurse at all times for urgent questions. Please call the main number to the clinic Dept: [phone] and follow the prompts.   For any non-urgent questions, you may also contact your provider using MyChart. We now offer e-Visits for anyone 18 and older to request care online for non-urgent symptoms. For details visit [email or web address].   Also download the MyChart app! Go to the app store, search "MyChart", open the app, select [facility], and log in with your MyChart username and password.  Due to Covid, a mask is required upon entering the hospital/clinic. If you do not have a mask, one will be given to you upon arrival. For doctor visits, patients may have 1 support person aged 18 or older with them. For treatment visits, patients cannot have anyone with them due to current Covid guidelines and our immunocompromised population.   

## 2021-04-17 DIAGNOSIS — M6259 Muscle wasting and atrophy, not elsewhere classified, multiple sites: Secondary | ICD-10-CM | POA: Diagnosis not present

## 2021-04-17 DIAGNOSIS — I10 Essential (primary) hypertension: Secondary | ICD-10-CM | POA: Diagnosis not present

## 2021-04-17 DIAGNOSIS — E118 Type 2 diabetes mellitus with unspecified complications: Secondary | ICD-10-CM | POA: Diagnosis not present

## 2021-04-17 DIAGNOSIS — Z9181 History of falling: Secondary | ICD-10-CM | POA: Diagnosis not present

## 2021-04-17 DIAGNOSIS — R269 Unspecified abnormalities of gait and mobility: Secondary | ICD-10-CM | POA: Diagnosis not present

## 2021-04-17 DIAGNOSIS — M25572 Pain in left ankle and joints of left foot: Secondary | ICD-10-CM | POA: Diagnosis not present

## 2021-04-17 DIAGNOSIS — M25671 Stiffness of right ankle, not elsewhere classified: Secondary | ICD-10-CM | POA: Diagnosis not present

## 2021-04-17 DIAGNOSIS — F339 Major depressive disorder, recurrent, unspecified: Secondary | ICD-10-CM | POA: Diagnosis not present

## 2021-04-19 ENCOUNTER — Other Ambulatory Visit (HOSPITAL_COMMUNITY): Payer: Self-pay

## 2021-04-20 DIAGNOSIS — Z9181 History of falling: Secondary | ICD-10-CM | POA: Diagnosis not present

## 2021-04-20 DIAGNOSIS — E118 Type 2 diabetes mellitus with unspecified complications: Secondary | ICD-10-CM | POA: Diagnosis not present

## 2021-04-20 DIAGNOSIS — F339 Major depressive disorder, recurrent, unspecified: Secondary | ICD-10-CM | POA: Diagnosis not present

## 2021-04-20 DIAGNOSIS — M25572 Pain in left ankle and joints of left foot: Secondary | ICD-10-CM | POA: Diagnosis not present

## 2021-04-20 DIAGNOSIS — R269 Unspecified abnormalities of gait and mobility: Secondary | ICD-10-CM | POA: Diagnosis not present

## 2021-04-20 DIAGNOSIS — I10 Essential (primary) hypertension: Secondary | ICD-10-CM | POA: Diagnosis not present

## 2021-04-20 DIAGNOSIS — M25671 Stiffness of right ankle, not elsewhere classified: Secondary | ICD-10-CM | POA: Diagnosis not present

## 2021-04-20 DIAGNOSIS — M6259 Muscle wasting and atrophy, not elsewhere classified, multiple sites: Secondary | ICD-10-CM | POA: Diagnosis not present

## 2021-04-24 DIAGNOSIS — M25671 Stiffness of right ankle, not elsewhere classified: Secondary | ICD-10-CM | POA: Diagnosis not present

## 2021-04-24 DIAGNOSIS — M25572 Pain in left ankle and joints of left foot: Secondary | ICD-10-CM | POA: Diagnosis not present

## 2021-04-24 DIAGNOSIS — I10 Essential (primary) hypertension: Secondary | ICD-10-CM | POA: Diagnosis not present

## 2021-04-24 DIAGNOSIS — Z9181 History of falling: Secondary | ICD-10-CM | POA: Diagnosis not present

## 2021-04-24 DIAGNOSIS — M6259 Muscle wasting and atrophy, not elsewhere classified, multiple sites: Secondary | ICD-10-CM | POA: Diagnosis not present

## 2021-04-24 DIAGNOSIS — R269 Unspecified abnormalities of gait and mobility: Secondary | ICD-10-CM | POA: Diagnosis not present

## 2021-04-24 DIAGNOSIS — E118 Type 2 diabetes mellitus with unspecified complications: Secondary | ICD-10-CM | POA: Diagnosis not present

## 2021-04-24 DIAGNOSIS — F339 Major depressive disorder, recurrent, unspecified: Secondary | ICD-10-CM | POA: Diagnosis not present

## 2021-04-30 NOTE — Progress Notes (Signed)
? ?Patient Care Team: ?Isaac Bliss, Rayford Halsted, MD as PCP - General (Internal Medicine) ?Larey Dresser, MD as PCP - Advanced Heart Failure (Cardiology) ?Stark Klein, MD as Consulting Physician (General Surgery) ?Jacelyn Pi, MD as Consulting Physician (Endocrinology) ?[name], MD as Consulting Physician (Plastic Surgery) ?Gery Pray, MD as Consulting Physician (Radiation Oncology) ?Juanita Craver, MD as Consulting Physician (Gastroenterology) ?Alda Berthold, DO as Consulting Physician (Neurology) ?Starlyn Skeans, MD as Consulting Physician (Family Medicine) ?Sherlynn Stalls, MD as Consulting Physician (Ophthalmology) ?Salvadore Dom, MD as Consulting Physician (Obstetrics and Gynecology) ?Raina Mina, RPH-CPP (Pharmacist) ?Nicholas Lose, MD as Consulting Physician (Hematology and Oncology) ? ?DIAGNOSIS:  ?Encounter Diagnosis  ?Name Primary?  ? Malignant neoplasm of upper-outer quadrant of right breast in female, estrogen receptor negative ([place]) Yes  ? ? ?SUMMARY OF ONCOLOGIC HISTORY: ?Oncology History  ?Breast cancer of upper-outer quadrant of right female breast ([place])  ?07/19/2014 Initial Biopsy  ? (R) breast needle biopsy (10:00): IDC, DCIS, grade 2. ER-, PR-, HER2+ (ratio 2.42). Ki67 90%.  ?  ?07/22/2014 Initial Diagnosis  ? Breast cancer of upper-outer quadrant of right female breast ? ?  ?08/08/2014 - 11/21/2014 Neo-Adjuvant Chemotherapy  ? Taxotere/Carbo/Herceptin/Perjeta x 6 cycles.  ? ?  ?12/12/2014 - 10/02/2015 Chemotherapy  ? Maintenance Herceptin (to complete 1 year of therapy).  ? ?  ?12/27/2014 Surgery  ? (R) lumpectomy with SLNB ([place]): IDC, grade 2, spanning 4 cm, associated high grade DCIS. Negative margins. 1/3 SLN (+).   ER/PR repeated and remain negative.   ypT2, ypN1a: Stage IIB ? ?  ?01/06/2015 Surgery  ? Bilateral breast mammoplasty ([place]): Right breast path with intralymphatic emboli of ductal carcinoma. Left breast benign.  ? ?  ?02/02/2015 Imaging  ? CT  chest: No findings of metastatic disease in the chest. Right axillary fluid density lesion likely postoperative seroma or hematoma. Small left subpectoral nodes warrant followup attention. ? ?  ?02/02/2015 Imaging  ? Bone scan: No scintigraphic evidence of osseous metastatic disease ? ?  ?02/16/2015 Surgery  ? (R) mastectomy and ALND ([place]): Scattered foci of high grade DCIS, scattered intralymphatic tumor emboli. Margins negative. 0/15 LNs.  ? ?  ?04/05/2015 - 05/19/2015 Radiation Therapy  ? Adjuvant XRT ([place]). The right chest wall was treated to 50.4 Gy in 28 fractions at 1.8 Gy per fraction.  The right mastectomy scar was treated to 10 Gy in 5 fractions at 2 Gy per fraction.  ? ?  ?12/06/2015 PET scan  ? Right subpectoral lymph node measuring 9 mm and exhibits malignant range FDG uptake, suspicious for metastatic adenopathy. Subcutaneous nodule within the upper left chest wall exhibits malignant range uptake, which is indeterminate; this is at the site of previous Port-A-Cath and is favored to represent postsurgical change. Indeterminate left adrenal nodules which exhibit mild uptake, favored to represent benign adenoma. ? ?  ?08/11/2017 - 11/03/2017 Chemotherapy  ? The patient had ado-trastuzumab emtansine (KADCYLA) 320 mg in sodium chloride 0.9 % 250 mL chemo infusion, 3.6 mg/kg = 320 mg, Intravenous, Once, 4 of 5 cycles ?Administration: 320 mg (08/12/2017), 320 mg (09/01/2017), 320 mg (09/23/2017) ? ? for chemotherapy treatment.  ? ?  ?11/18/2017 - 08/30/2020 Chemotherapy  ? The patient had trastuzumab (HERCEPTIN) 546 mg in sodium chloride 0.9 % 250 mL chemo infusion, 6 mg/kg = 546 mg (100 % of original dose 6 mg/kg), Intravenous,  Once, 2 of 2 cycles ?Dose modification: 6 mg/kg (original dose 6 mg/kg, Cycle 1, Reason: Provider Judgment) ?Administration: 546  mg (11/18/2017), 546 mg (12/16/2017) ?pertuzumab (PERJETA) 420 mg in sodium chloride 0.9 % 250 mL chemo infusion, 420 mg (100 % of original dose 420 mg),  Intravenous, Once, 15 of 15 cycles ?Dose modification: 420 mg (original dose 420 mg, Cycle 18), 420 mg (original dose 420 mg, Cycle 27) ?Administration: 420 mg (10/06/2018), 420 mg (10/27/2018), 420 mg (11/18/2018), 420 mg (12/08/2018), 420 mg (12/29/2018), 420 mg (01/19/2019), 420 mg (02/16/2019), 420 mg (03/16/2019), 420 mg (04/13/2019), 420 mg (06/08/2019), 420 mg (07/06/2019), 420 mg (08/03/2019), 420 mg (08/31/2019), 420 mg (09/28/2019), 420 mg (05/19/2019) ?trastuzumab-anns (KANJINTI) 546 mg in sodium chloride 0.9 % 250 mL chemo infusion, 6 mg/kg = 546 mg (100 % of original dose 6 mg/kg), Intravenous,  Once, 30 of 30 cycles ?Dose modification: 6 mg/kg (original dose 6 mg/kg, Cycle 3, Reason: Other (see comments), Comment: requested step therapy by [facility] insurance), 4 mg/kg (original dose 6 mg/kg, Cycle 9, Reason: Provider Judgment), 6 mg/kg (original dose 6 mg/kg, Cycle 17, Reason: Provider Judgment), 4 mg/kg (original dose 6 mg/kg, Cycle 27, Reason: Provider Judgment), 6 mg/kg (original dose 6 mg/kg, Cycle 27, Reason: Provider Judgment) ?Administration: 546 mg (01/13/2018), 546 mg (02/10/2018), 546 mg (03/03/2018), 546 mg (03/24/2018), 546 mg (04/14/2018), 546 mg (05/05/2018), 357 mg (05/26/2018), 357 mg (06/09/2018), 357 mg (06/23/2018), 357 mg (07/07/2018), 357 mg (07/21/2018), 357 mg (08/04/2018), 357 mg (08/18/2018), 357 mg (09/01/2018), 546 mg (09/15/2018), 546 mg (10/06/2018), 546 mg (10/27/2018), 546 mg (11/18/2018), 546 mg (12/08/2018), 546 mg (12/29/2018), 546 mg (01/19/2019), 546 mg (02/16/2019), 480 mg (03/16/2019), 483 mg (04/13/2019), 450 mg (06/08/2019), 450 mg (07/06/2019), 450 mg (08/03/2019), 450 mg (08/31/2019), 450 mg (09/28/2019) ? ? for chemotherapy treatment.  ? ?  ?12/31/2017 - 09/01/2018 Chemotherapy  ? The patient had PACLitaxel-protein bound (ABRAXANE) chemo infusion 175 mg, 80 mg/m2 = 175 mg (100 % of original dose 80 mg/m2), Intravenous,  Once, 5 of 10 cycles ?Dose modification: 80 mg/m2 (original dose 80 mg/m2, Cycle  11) ?Administration: 175 mg (07/07/2018), 175 mg (07/21/2018), 175 mg (08/04/2018), 175 mg (08/18/2018), 175 mg (09/01/2018) ?PACLitaxel-protein bound (ABRAXANE) chemo infusion 200 mg, 100 mg/m2 = 200 mg, Intravenous, Once, 10 of 10 cycles ?Dose modification: 80 mg/m2 (original dose 100 mg/m2, Cycle 2, Reason: Provider Judgment), 80 mg/m2 (original dose 100 mg/m2, Cycle 2, Reason: Provider Judgment) ?Administration: 200 mg (12/31/2017), 200 mg (01/06/2018), 175 mg (01/20/2018), 175 mg (01/27/2018), 175 mg (02/10/2018), 175 mg (03/03/2018), 175 mg (03/10/2018), 175 mg (03/24/2018), 175 mg (04/07/2018), 175 mg (04/14/2018), 175 mg (04/28/2018), 175 mg (05/05/2018), 175 mg (05/26/2018), 175 mg (06/09/2018), 175 mg (05/19/2018) ? ? for chemotherapy treatment.  ? ?  ?10/05/2020 - 01/18/2021 Chemotherapy  ? Patient is on Treatment Plan : BREAST METASTATIC fam-trastuzumab deruxtecan-nxki (Enhertu) q21d  ? ?  ?  ?02/19/2021 -  Chemotherapy  ? Patient is on Treatment Plan : BREAST Trastuzumab q21d x 13 cycles  ? ?  ?  ?Metastasis to liver [facility])  ?08/04/2017 Initial Diagnosis  ? Metastasis to liver [facility]) ? ?  ?08/12/2017 - 10/14/2017 Chemotherapy  ? The patient had ado-trastuzumab emtansine (KADCYLA) 320 mg in sodium chloride 0.9 % 250 mL chemo infusion, 3.6 mg/kg = 320 mg, Intravenous, Once, 4 of 5 cycles ?Administration: 320 mg (08/12/2017), 320 mg (09/01/2017), 320 mg (09/23/2017) ? ? for chemotherapy treatment.  ? ?  ?11/18/2017 - 08/30/2020 Chemotherapy  ? The patient had trastuzumab (HERCEPTIN) 546 mg in sodium chloride 0.9 % 250 mL chemo infusion, 6 mg/kg = 546 mg (100 % of original  dose 6 mg/kg), Intravenous,  Once, 2 of 2 cycles ?Dose modification: 6 mg/kg (original dose 6 mg/kg, Cycle 1, Reason: Provider Judgment) ?Administration: 546 mg (11/18/2017), 546 mg (12/16/2017) ?pertuzumab (PERJETA) 420 mg in sodium chloride 0.9 % 250 mL chemo infusion, 420 mg (100 % of original dose 420 mg), Intravenous, Once, 15 of 15 cycles ?Dose modification: 420 mg  (original dose 420 mg, Cycle 18), 420 mg (original dose 420 mg, Cycle 27) ?Administration: 420 mg (10/06/2018), 420 mg (10/27/2018), 420 mg (11/18/2018), 420 mg (12/08/2018), 420 mg (12/29/2018), 420 mg (01/19/2019

## 2021-05-01 DIAGNOSIS — E118 Type 2 diabetes mellitus with unspecified complications: Secondary | ICD-10-CM | POA: Diagnosis not present

## 2021-05-01 DIAGNOSIS — Z9181 History of falling: Secondary | ICD-10-CM | POA: Diagnosis not present

## 2021-05-01 DIAGNOSIS — M25671 Stiffness of right ankle, not elsewhere classified: Secondary | ICD-10-CM | POA: Diagnosis not present

## 2021-05-01 DIAGNOSIS — M25572 Pain in left ankle and joints of left foot: Secondary | ICD-10-CM | POA: Diagnosis not present

## 2021-05-01 DIAGNOSIS — I10 Essential (primary) hypertension: Secondary | ICD-10-CM | POA: Diagnosis not present

## 2021-05-01 DIAGNOSIS — M6259 Muscle wasting and atrophy, not elsewhere classified, multiple sites: Secondary | ICD-10-CM | POA: Diagnosis not present

## 2021-05-01 DIAGNOSIS — F339 Major depressive disorder, recurrent, unspecified: Secondary | ICD-10-CM | POA: Diagnosis not present

## 2021-05-01 DIAGNOSIS — R269 Unspecified abnormalities of gait and mobility: Secondary | ICD-10-CM | POA: Diagnosis not present

## 2021-05-03 ENCOUNTER — Ambulatory Visit (INDEPENDENT_AMBULATORY_CARE_PROVIDER_SITE_OTHER): Payer: Medicare PPO | Admitting: Family Medicine

## 2021-05-07 ENCOUNTER — Encounter: Payer: Self-pay | Admitting: Hematology and Oncology

## 2021-05-07 ENCOUNTER — Encounter (INDEPENDENT_AMBULATORY_CARE_PROVIDER_SITE_OTHER): Payer: Self-pay | Admitting: Family Medicine

## 2021-05-08 ENCOUNTER — Ambulatory Visit (INDEPENDENT_AMBULATORY_CARE_PROVIDER_SITE_OTHER): Payer: Medicare PPO | Admitting: Family Medicine

## 2021-05-08 VITALS — BP 142/58 | HR 53 | Temp 97.6°F | Ht 69.0 in | Wt 166.6 lb

## 2021-05-08 DIAGNOSIS — E1169 Type 2 diabetes mellitus with other specified complication: Secondary | ICD-10-CM | POA: Diagnosis not present

## 2021-05-08 DIAGNOSIS — M25572 Pain in left ankle and joints of left foot: Secondary | ICD-10-CM | POA: Diagnosis not present

## 2021-05-08 DIAGNOSIS — Z9181 History of falling: Secondary | ICD-10-CM | POA: Diagnosis not present

## 2021-05-08 DIAGNOSIS — F339 Major depressive disorder, recurrent, unspecified: Secondary | ICD-10-CM | POA: Diagnosis not present

## 2021-05-08 DIAGNOSIS — E669 Obesity, unspecified: Secondary | ICD-10-CM | POA: Diagnosis not present

## 2021-05-08 DIAGNOSIS — M6259 Muscle wasting and atrophy, not elsewhere classified, multiple sites: Secondary | ICD-10-CM | POA: Diagnosis not present

## 2021-05-08 DIAGNOSIS — R269 Unspecified abnormalities of gait and mobility: Secondary | ICD-10-CM | POA: Diagnosis not present

## 2021-05-08 DIAGNOSIS — Z6824 Body mass index (BMI) 24.0-24.9, adult: Secondary | ICD-10-CM | POA: Diagnosis not present

## 2021-05-08 DIAGNOSIS — F3289 Other specified depressive episodes: Secondary | ICD-10-CM | POA: Diagnosis not present

## 2021-05-08 DIAGNOSIS — Z794 Long term (current) use of insulin: Secondary | ICD-10-CM

## 2021-05-08 DIAGNOSIS — I10 Essential (primary) hypertension: Secondary | ICD-10-CM | POA: Diagnosis not present

## 2021-05-08 DIAGNOSIS — E118 Type 2 diabetes mellitus with unspecified complications: Secondary | ICD-10-CM | POA: Diagnosis not present

## 2021-05-08 DIAGNOSIS — M25671 Stiffness of right ankle, not elsewhere classified: Secondary | ICD-10-CM | POA: Diagnosis not present

## 2021-05-08 DIAGNOSIS — E1165 Type 2 diabetes mellitus with hyperglycemia: Secondary | ICD-10-CM | POA: Diagnosis not present

## 2021-05-08 MED ORDER — ACCU-CHEK SOFTCLIX LANCETS MISC: 1.0000 | Freq: Four times a day (QID) | 1 refills | Status: DC

## 2021-05-08 MED ORDER — BUPROPION HCL ER (SR) 200 MG PO TB12: ORAL_TABLET | ORAL | 0 refills | Status: DC

## 2021-05-08 MED ORDER — VORTIOXETINE HBR 20 MG PO TABS: 20.0000 mg | ORAL_TABLET | Freq: Every day | ORAL | 0 refills | Status: DC

## 2021-05-09 ENCOUNTER — Other Ambulatory Visit: Payer: Self-pay | Admitting: *Deleted

## 2021-05-09 DIAGNOSIS — C50411 Malignant neoplasm of upper-outer quadrant of right female breast: Secondary | ICD-10-CM

## 2021-05-09 MED ORDER — LOSARTAN POTASSIUM 100 MG PO TABS: ORAL_TABLET | ORAL | 0 refills | Status: DC

## 2021-05-11 DIAGNOSIS — E1165 Type 2 diabetes mellitus with hyperglycemia: Secondary | ICD-10-CM | POA: Diagnosis not present

## 2021-05-14 ENCOUNTER — Inpatient Hospital Stay: Payer: Medicare PPO

## 2021-05-14 ENCOUNTER — Other Ambulatory Visit: Payer: Self-pay

## 2021-05-14 ENCOUNTER — Inpatient Hospital Stay: Payer: Medicare PPO | Admitting: Hematology and Oncology

## 2021-05-14 ENCOUNTER — Encounter: Payer: Self-pay | Admitting: Hematology and Oncology

## 2021-05-14 ENCOUNTER — Inpatient Hospital Stay: Payer: Medicare PPO | Attending: Oncology

## 2021-05-14 VITALS — BP 136/55 | HR 93 | Temp 98.1°F | Resp 18 | Ht 69.0 in | Wt 171.1 lb

## 2021-05-14 DIAGNOSIS — C50411 Malignant neoplasm of upper-outer quadrant of right female breast: Secondary | ICD-10-CM

## 2021-05-14 DIAGNOSIS — Z171 Estrogen receptor negative status [ER-]: Secondary | ICD-10-CM

## 2021-05-14 DIAGNOSIS — C50811 Malignant neoplasm of overlapping sites of right female breast: Secondary | ICD-10-CM | POA: Insufficient documentation

## 2021-05-14 DIAGNOSIS — Z7189 Other specified counseling: Secondary | ICD-10-CM

## 2021-05-14 DIAGNOSIS — Z5112 Encounter for antineoplastic immunotherapy: Secondary | ICD-10-CM | POA: Diagnosis not present

## 2021-05-14 DIAGNOSIS — N189 Chronic kidney disease, unspecified: Secondary | ICD-10-CM | POA: Insufficient documentation

## 2021-05-14 DIAGNOSIS — Z95828 Presence of other vascular implants and grafts: Secondary | ICD-10-CM

## 2021-05-14 DIAGNOSIS — C787 Secondary malignant neoplasm of liver and intrahepatic bile duct: Secondary | ICD-10-CM | POA: Insufficient documentation

## 2021-05-14 LAB — CBC WITH DIFFERENTIAL (CANCER CENTER ONLY)
Abs Immature Granulocytes: 0.02 10*3/uL (ref 0.00–0.07)
Basophils Absolute: 0 10*3/uL (ref 0.0–0.1)
Basophils Relative: 0 %
Eosinophils Absolute: 0.1 10*3/uL (ref 0.0–0.5)
Eosinophils Relative: 3 %
HCT: 33 % — ABNORMAL LOW (ref 36.0–46.0)
Hemoglobin: 10.7 g/dL — ABNORMAL LOW (ref 12.0–15.0)
Immature Granulocytes: 0 %
Lymphocytes Relative: 15 %
Lymphs Abs: 0.8 10*3/uL (ref 0.7–4.0)
MCH: 31.2 pg (ref 26.0–34.0)
MCHC: 32.4 g/dL (ref 30.0–36.0)
MCV: 96.2 fL (ref 80.0–100.0)
Monocytes Absolute: 0.4 10*3/uL (ref 0.1–1.0)
Monocytes Relative: 7 %
Neutro Abs: 4.2 10*3/uL (ref 1.7–7.7)
Neutrophils Relative %: 75 %
Platelet Count: 203 10*3/uL (ref 150–400)
RBC: 3.43 MIL/uL — ABNORMAL LOW (ref 3.87–5.11)
RDW: 12.7 % (ref 11.5–15.5)
WBC Count: 5.5 10*3/uL (ref 4.0–10.5)
nRBC: 0 % (ref 0.0–0.2)

## 2021-05-14 LAB — CMP (CANCER CENTER ONLY)
ALT: 14 U/L (ref 0–44)
AST: 23 U/L (ref 15–41)
Albumin: 3.7 g/dL (ref 3.5–5.0)
Alkaline Phosphatase: 45 U/L (ref 38–126)
Anion gap: 6 (ref 5–15)
BUN: 62 mg/dL — ABNORMAL HIGH (ref 8–23)
CO2: 25 mmol/L (ref 22–32)
Calcium: 8.9 mg/dL (ref 8.9–10.3)
Chloride: 110 mmol/L (ref 98–111)
Creatinine: 2.17 mg/dL — ABNORMAL HIGH (ref 0.44–1.00)
GFR, Estimated: 24 mL/min — ABNORMAL LOW (ref >60)
Glucose, Bld: 160 mg/dL — ABNORMAL HIGH (ref 70–99)
Potassium: 4.7 mmol/L (ref 3.5–5.1)
Sodium: 141 mmol/L (ref 135–145)
Total Bilirubin: 0.4 mg/dL (ref 0.3–1.2)
Total Protein: 6.1 g/dL — ABNORMAL LOW (ref 6.5–8.1)

## 2021-05-14 LAB — CEA (IN HOUSE-CHCC): CEA (CHCC-In House): 49.1 ng/mL — ABNORMAL HIGH (ref 0.00–5.00)

## 2021-05-14 MED ORDER — SODIUM CHLORIDE 0.9% FLUSH
10.0000 mL | Freq: Once | INTRAVENOUS | Status: AC
  Administered 2021-05-14: 10 mL

## 2021-05-14 MED ORDER — SODIUM CHLORIDE 0.9% FLUSH
10.0000 mL | INTRAVENOUS | Status: DC | PRN
  Administered 2021-05-14: 10 mL

## 2021-05-14 MED ORDER — TRASTUZUMAB-ANNS CHEMO 150 MG IV SOLR
6.0000 mg/kg | Freq: Once | INTRAVENOUS | Status: AC
  Administered 2021-05-14: 441 mg via INTRAVENOUS
  Filled 2021-05-14: qty 21

## 2021-05-14 MED ORDER — DIPHENHYDRAMINE HCL 25 MG PO CAPS
25.0000 mg | ORAL_CAPSULE | Freq: Once | ORAL | Status: AC
  Administered 2021-05-14: 25 mg via ORAL
  Filled 2021-05-14: qty 1

## 2021-05-14 MED ORDER — ACETAMINOPHEN 325 MG PO TABS
650.0000 mg | ORAL_TABLET | Freq: Once | ORAL | Status: AC
  Administered 2021-05-14: 650 mg via ORAL
  Filled 2021-05-14: qty 2

## 2021-05-14 MED ORDER — SODIUM CHLORIDE 0.9 % IV SOLN: Freq: Once | INTRAVENOUS | Status: AC

## 2021-05-14 MED ORDER — HEPARIN SOD (PORK) LOCK FLUSH 100 UNIT/ML IV SOLN
500.0000 [IU] | Freq: Once | INTRAVENOUS | Status: AC | PRN
  Administered 2021-05-14: 500 [IU]

## 2021-05-14 NOTE — Progress Notes (Signed)
Per Merleen Nicely RN, okay to proceed w/ echo from 01/16/21. Pt has upcoming echo scheduled for 07/18/21.  ?

## 2021-05-14 NOTE — Assessment & Plan Note (Addendum)
07/19/2014: Right breast cancer grade 2 IDC ER/PR negative HER2 positive Ki-67 90% treated with neoadjuvant chemo with TCHP x6 cycles followed by Herceptin maintenance, right lumpectomy had residual disease 4 cm 1/3 lymph node positive, ER/PR negative, adjuvant radiation and capecitabine completed 09/27/2015 ?12/06/2015: Recurrence: Right subpectoral lymph node and subcutaneous nodule, liver mass (biopsy adenocarcinoma consistent with upper GI or pancreaticobiliary primary, HER2 negative, ER negative) ?July 2019: Metastatic disease: TDM 1 followed by Herceptin maintenance, pertuzumab added 10/06/2018, Abraxane 12/31/2017-09/01/2018, Herceptin and pertuzumab maintenance ?? ?Current treatment: Enhertu started 10/05/2020 switched to Herceptin maintenance 02/19/2021 ?Herceptin toxicities:? ?no adverse effects ?? ?02/04/2021: PET CT scan: Complete metabolic response, no evidence of hypermetabolic disease in the liver (mass measured 2.7 cm previously is now 1.5 cm with no uptake) ?? ?Acute on chronic constipation: Patient is fine to see gastroenterology.   ?Chronic renal failure: Today's creatinine is much better than before. ?Return to clinic every 4 weeks for Herceptin and every 8 weeks to follow-up with me with labs. ? ?She was approached by a molecular diagnostic company to run biomarker analysis.  She is contemplating on doing it. ?Scans in 3 months ?

## 2021-05-14 NOTE — Patient Instructions (Signed)
[facility]  Discharge Instructions: Thank you for choosing [facility] to provide your oncology and hematology care.   If you have a lab appointment with the Cancer Center, please go directly to the Cancer Center and check in at the registration area.   Wear comfortable clothing and clothing appropriate for easy access to any Portacath or PICC line.   We strive to give you quality time with your provider. You may need to reschedule your appointment if you arrive late (15 or more minutes).  Arriving late affects you and other patients whose appointments are after yours.  Also, if you miss three or more appointments without notifying the office, you may be dismissed from the clinic at the provider's discretion.      For prescription refill requests, have your pharmacy contact our office and allow 72 hours for refills to be completed.    Today you received the following chemotherapy and/or immunotherapy agents herceptin      To help prevent nausea and vomiting after your treatment, we encourage you to take your nausea medication as directed.  BELOW ARE SYMPTOMS THAT SHOULD BE REPORTED IMMEDIATELY: *FEVER GREATER THAN 100.4 F (38 C) OR HIGHER *CHILLS OR SWEATING *NAUSEA AND VOMITING THAT IS NOT CONTROLLED WITH YOUR NAUSEA MEDICATION *UNUSUAL SHORTNESS OF BREATH *UNUSUAL BRUISING OR BLEEDING *URINARY PROBLEMS (pain or burning when urinating, or frequent urination) *BOWEL PROBLEMS (unusual diarrhea, constipation, pain near the anus) TENDERNESS IN MOUTH AND THROAT WITH OR WITHOUT PRESENCE OF ULCERS (sore throat, sores in mouth, or a toothache) UNUSUAL RASH, SWELLING OR PAIN  UNUSUAL VAGINAL DISCHARGE OR ITCHING   Items with * indicate a potential emergency and should be followed up as soon as possible or go to the Emergency Department if any problems should occur.  Please show the CHEMOTHERAPY ALERT CARD or IMMUNOTHERAPY ALERT CARD at check-in to  the Emergency Department and triage nurse.  Should you have questions after your visit or need to cancel or reschedule your appointment, please contact  [facility]  Dept: [phone]  and follow the prompts.  Office hours are 8:00 a.m. to 4:30 p.m. Monday - Friday. Please note that voicemails left after 4:00 p.m. may not be returned until the following business day.  We are closed weekends and major holidays. You have access to a nurse at all times for urgent questions. Please call the main number to the clinic Dept: [phone] and follow the prompts.   For any non-urgent questions, you may also contact your provider using MyChart. We now offer e-Visits for anyone 18 and older to request care online for non-urgent symptoms. For details visit [email or web address].   Also download the MyChart app! Go to the app store, search "MyChart", open the app, select , and log in with your MyChart username and password.  Due to Covid, a mask is required upon entering the hospital/clinic. If you do not have a mask, one will be given to you upon arrival. For doctor visits, patients may have 1 support Darlene [name] aged 18 or older with them. For treatment visits, patients cannot have anyone with them due to current Covid guidelines and our immunocompromised population.   

## 2021-05-15 ENCOUNTER — Telehealth: Payer: Self-pay | Admitting: Hematology and Oncology

## 2021-05-15 ENCOUNTER — Other Ambulatory Visit: Payer: Self-pay | Admitting: *Deleted

## 2021-05-15 DIAGNOSIS — C50411 Malignant neoplasm of upper-outer quadrant of right female breast: Secondary | ICD-10-CM

## 2021-05-15 NOTE — Telephone Encounter (Signed)
Messaged Dr.Gudena's nurse about getting some clarity regarding getting this patient scheduled.  Los from 4/24 states PET 3 months, FU with Dr.Gudena after . There was a scheduling message stating to get the patient scheduled for 5/22. Nurse states that patient prefers earlier PET and FU; therefore, the patient was scheduled for next month. Conversation through 4/25 secure chat. ?

## 2021-05-16 ENCOUNTER — Encounter: Payer: Self-pay | Admitting: Hematology and Oncology

## 2021-05-21 NOTE — Progress Notes (Signed)
? ? ? ?Chief Complaint:  ? ?OBESITY ?Darlene [name] is here to discuss her progress with her obesity treatment plan along with follow-up of her obesity related diagnoses. Darlene [name] is on keeping a food journal and adhering to recommended goals of 1500-1800 calories and 90+ grams of protein daily and states she is following her eating plan approximately 90% of the time. Darlene [name] states she is doing physical therapy for 30-60 minutes 7 times per week. ? ?Today's visit was #: 57 ?Starting weight: 225 lbs ?Starting date: 08/09/2016 ?Today's weight: 166 lbs ?Today's date: 05/08/2021 ?Total lbs lost to date: 74 ?Total lbs lost since last in-office visit: 0 ? ?Interim History: Darlene [name]'s goal was to gain a little after losing a bit too much since her previous visit. She is eating healthy and she is working on strengthening activity with PT. She feels she doing well overall. ? ?Subjective:  ? ?1. Type 2 diabetes mellitus with other specified complication, with long-term current use of insulin ([place]) ?Darlene [name] is getting fluctuations in her Freestyle CGM. She is having some senor issues.  ? ?2. Essential hypertension ?Darlene [name]'s blood pressure is a little elevated today. She is working on her diet and exercise, and maintaining her weight. ? ?3. Other depression with emotional eating ?Darlene [name]'s mood is stable on her medications. She requests a refill today.  ? ?Assessment/Plan:  ? ?1. Type 2 diabetes mellitus with other specified complication, with long-term current use of insulin ([place]) ?We will refill lancets, accu soft clicks for 90 days with no refills.  ? ?- Accu-Chek Softclix Lancets lancets; 1 each by Other route 4 (four) times daily. Test blood sugars 4 x times daily  Dispense: 200 each; Refill: 1 ? ?2. Essential hypertension ?Brendolyn will continue losartan 100 mg daily, and we will refill for 90 days. We will recheck her blood pressure in 3 months. ? ?- losartan (COZAAR) 100 MG tablet; 1 tablet  Dispense: 90 tablet; Refill:  0 ? ?3. Other depression with emotional eating ?Yalonda will continue her medications, and we will refill Wellbutrin SR and Trintellix for 90 days. ? ?- buPROPion (WELLBUTRIN SR) 200 MG 12 hr tablet; Take one tablet by mouth once daily.  Dispense: 90 tablet; Refill: 0 ?- vortioxetine HBr (TRINTELLIX) 20 MG TABS tablet; Take 1 tablet (20 mg total) by mouth at bedtime.  Dispense: 90 tablet; Refill: 0 ? ?4. Obesity with current BMI 24.6 ?Darlene [name] is currently in the action stage of change. As such, her goal is to maintain weight for now and continue to work on muscle building. She has agreed to keeping a food journal and adhering to recommended goals of 1800 calories and 100 grams of protein daily.  ? ?Exercise goals: As is. ? ?Behavioral modification strategies: increasing lean protein intake. ? ?Darlene [name] has agreed to follow-up with our clinic in 12 weeks. She was informed of the importance of frequent follow-up visits to maximize her success with intensive lifestyle modifications for her multiple health conditions.  ? ?Objective:  ? ?Blood pressure (!) 142/58, pulse (!) 53, temperature 97.6 ?F (36.4 ?C), height '5\' 9"'$  (1.753 m), weight 166 lb 9.6 oz (75.6 kg), last menstrual period 10/22/2007, SpO2 97 %. ?Body mass index is 24.6 kg/m?. ? ?General: Cooperative, alert, well developed, in no acute distress. ?HEENT: Conjunctivae and lids unremarkable. ?Cardiovascular: Regular Darlene [name].  ?Lungs: Normal work of breathing. ?Neurologic: No focal deficits.  ? ?Lab Results  ?Component Value Date  ? CREATININE 2.17 (H) 05/14/2021  ? BUN 62 (H) 05/14/2021  ? NA  141 05/14/2021  ? K 4.7 05/14/2021  ? CL 110 05/14/2021  ? CO2 25 05/14/2021  ? ?Lab Results  ?Component Value Date  ? ALT 14 05/14/2021  ? AST 23 05/14/2021  ? ALKPHOS 45 05/14/2021  ? BILITOT 0.4 05/14/2021  ? ?Lab Results  ?Component Value Date  ? HGBA1C 6.3 (A) 11/23/2020  ? HGBA1C 5.3 07/04/2020  ? HGBA1C 5.2 01/04/2020  ? HGBA1C 5.4 10/05/2019  ? HGBA1C 5.6  05/11/2019  ? ?Lab Results  ?Component Value Date  ? INSULIN 7.6 01/25/2019  ? INSULIN 15.3 03/11/2017  ? INSULIN 18.3 08/09/2016  ? ?Lab Results  ?Component Value Date  ? TSH 1.45 04/11/2021  ? ?Lab Results  ?Component Value Date  ? CHOL 94 10/10/2020  ? HDL 55 10/10/2020  ? [place] 26 10/10/2020  ? TRIG 63 10/10/2020  ? CHOLHDL 1.7 10/10/2020  ? ?Lab Results  ?Component Value Date  ? VD25OH 49.3 01/04/2020  ? VD25OH 43.6 10/05/2019  ? VD25OH 43.5 08/27/2018  ? ?Lab Results  ?Component Value Date  ? WBC 5.5 05/14/2021  ? HGB 10.7 (L) 05/14/2021  ? HCT 33.0 (L) 05/14/2021  ? MCV 96.2 05/14/2021  ? PLT 203 05/14/2021  ? ?Lab Results  ?Component Value Date  ? IRON 58 02/18/2013  ? TIBC 305 02/18/2013  ? FERRITIN 133 10/31/2014  ? ? ?Obesity Behavioral Intervention:  ? ?Approximately 15 minutes were spent on the discussion below. ? ?ASK: ?We discussed the diagnosis of obesity with Darlene [name] today and Darlene [name] agreed to give Darlene [name] permission to discuss obesity behavioral modification therapy today. ? ?ASSESS: ?Darlene [name] has the diagnosis of obesity and her BMI today is 24.6. Darlene [name] is in the action stage of change.  ? ?ADVISE: ?Darlene [name] was educated on the multiple health risks of obesity as well as the benefit of weight loss to improve her health. She was advised of the need for long term treatment and the importance of lifestyle modifications to improve her current health and to decrease her risk of future health problems. ? ?AGREE: ?Multiple dietary modification options and treatment options were discussed and Darlene [name] agreed to follow the recommendations documented in the above note. ? ?ARRANGE: ?Darlene [name] was educated on the importance of frequent visits to treat obesity as outlined per CMS and USPSTF guidelines and agreed to schedule her next follow up appointment today. ? ?Attestation Statements:  ? ?Reviewed by clinician on day of visit: allergies, medications, problem list, medical history, surgical history, family  history, social history, and previous encounter notes. ? ? ?I, [name], am acting as transcriptionist for Dennard Nip, MD. ? ?I have reviewed the above documentation for accuracy and completeness, and I agree with the above. -  Dennard Nip, MD ? ? ?

## 2021-05-23 ENCOUNTER — Encounter: Payer: Self-pay | Admitting: Internal Medicine

## 2021-05-23 ENCOUNTER — Ambulatory Visit: Payer: Medicare PPO | Admitting: Internal Medicine

## 2021-05-23 VITALS — BP 130/70 | HR 48 | Temp 97.5°F | Wt 170.0 lb

## 2021-05-23 DIAGNOSIS — E7800 Pure hypercholesterolemia, unspecified: Secondary | ICD-10-CM

## 2021-05-23 DIAGNOSIS — C50411 Malignant neoplasm of upper-outer quadrant of right female breast: Secondary | ICD-10-CM

## 2021-05-23 DIAGNOSIS — E1169 Type 2 diabetes mellitus with other specified complication: Secondary | ICD-10-CM

## 2021-05-23 DIAGNOSIS — I10 Essential (primary) hypertension: Secondary | ICD-10-CM

## 2021-05-23 DIAGNOSIS — Z794 Long term (current) use of insulin: Secondary | ICD-10-CM

## 2021-05-23 LAB — MICROALBUMIN / CREATININE URINE RATIO
Creatinine,U: 54.8 mg/dL
Microalb Creat Ratio: 1.6 mg/g (ref 0.0–30.0)
Microalb, Ur: 0.9 mg/dL (ref 0.0–1.9)

## 2021-05-23 LAB — POCT GLYCOSYLATED HEMOGLOBIN (HGB A1C): Hemoglobin A1C: 5.9 % — AB (ref 4.0–5.6)

## 2021-05-23 NOTE — Progress Notes (Signed)
? ? ? ?Established Patient Office Visit ? ? ? ? ?CC/Reason for Visit: 41-monthfollow-up chronic medical conditions ? ?HPI: Darlene [name] a 71y.o. female who is coming in today for the above mentioned reasons. Past Medical History is significant for: hypertension, aortic atherosclerosis, cardiomyopathy secondary to chemotherapeutic agent, she has a history of breast cancer status post right-sided mastectomy, chemotherapy, radiation with what appears to be possible biochemical recurrence, scheduled for a PET scan on May 18.  She has a history of obstructive sleep apnea on CPAP.  Stage IV chronic kidney disease presumably due to diabetes, type 2 diabetes that has been well controlled, hyperlipidemia, depression.  She has significant peripheral neuropathy secondary to chemotherapy.  She has overall been doing well and has no acute concerns or complaints. ? ? ?Past Medical/Surgical History: ?Past Medical History:  ?Diagnosis Date  ? Anemia   ? hx of - during 1st round chemo  ? Anxiety   ? Arthritis   ? in fingers, shoulders  ? Back pain   ? Balance problem   ? Breast cancer (Darlene [name]   ? Breast cancer of upper-outer quadrant of right female breast (Darlene [name] 07/22/2014  ? Bunion, right foot   ? CHF (congestive heart failure) ([place]   ? Chronic kidney disease   ? creatininei levels high per pt   ? Clumsiness   ? Constipation   ? Depression   ? Diabetes mellitus without complication (Darlene [name]   ? 20+ years type 2   ? Diabetic retinopathy (Darlene [name]   ? torn retina  ? Eating disorder   ? binge eating  ? Epistaxis 08/26/2014  ? Fatty liver   ? Foot drop, left foot   ? HCAP (healthcare-associated pneumonia) 12/18/2014  ? Heart murmur   ? never had any problems  ? History of radiation therapy 04/05/15-05/19/15  ? right chest wall was treated to 50.4 Gy in 28 fractions, right mastectomy scar was treated to 10 Gy in 5 fractions  ? Hyperlipidemia   ? Hypertension   ? Joint pain   ? Multiple food allergies   ? Neuromuscular disorder (Darlene [name]    ? diabetic neuropathy  ? Obesity   ? Obstructive sleep apnea on CPAP   ? does not use cpap all the time has lost 160 lbs   ? Ovarian cyst rupture   ? possible  ? Pneumonia   ? Pneumonia 11/2014  ? Radiation 04/05/15-05/19/15  ? right chest wall 50.4 Gy, mastectomy scar 10 Gy  ? Sleep apnea   ? Thyroid nodule 06/2014  ? ? ?Past Surgical History:  ?Procedure Laterality Date  ? APPENDECTOMY    ? BIOPSY THYROID Bilateral 06/2014  ? 2 nodules - Benign   ? BREAST LUMPECTOMY WITH RADIOACTIVE SEED AND SENTINEL LYMPH NODE BIOPSY Right 12/27/2014  ? Procedure: BREAST LUMPECTOMY WITH RADIOACTIVE SEED AND SENTINEL LYMPH NODE BIOPSY;  Surgeon: FStark Klein MD;  [facility]  Service: General;  Laterality: Right;  ? BREAST REDUCTION SURGERY Bilateral 01/06/2015  ? Procedure: BILATERAL BREAST REDUCTION, RIGHT ONCOPLASTIC RECONSTRUCTION),LEFT BREAST REDUCTION FOR SYMMETRY;  Surgeon: BIrene Limbo MD;  [place] [facility]  Service: Plastics;  Laterality: Bilateral;  ? BREATH TEK H PYLORI N/A 09/15/2012  ? Procedure: BREATH TEK H PYLORI;  Surgeon: MPedro Earls MD;  [facility];  Service: General;  Laterality: N/A;  ? BUNIONECTOMY Left 12/2013  ? CARPAL TUNNEL RELEASE Right   ? CARPAL TUNNEL RELEASE Left   ? CATARACT EXTRACTION    ?  6/19  ? COLONOSCOPY    ? DUPUYTREN CONTRACTURE RELEASE    ? GASTRIC ROUX-EN-Y N/A 11/02/2012  ? Procedure: LAPAROSCOPIC ROUX-EN-Y GASTRIC;  Surgeon: Pedro Earls, MD;  [facility];  Service: General;  Laterality: N/A;  ? IR GENERIC HISTORICAL  03/18/2016  ? IR US GUIDE VASC ACCESS LEFT 03/18/2016 Markus Daft, MD [facility]  ? IR GENERIC HISTORICAL  03/18/2016  ? IR FLUORO GUIDE CV LINE LEFT 03/18/2016 Markus Daft, MD [facility]  ? LAPAROSCOPIC APPENDECTOMY N/A 07/22/2015  ? Procedure: APPENDECTOMY LAPAROSCOPIC;  Surgeon: Michael Boston, MD;  [facility];  Service: General;  Laterality: N/A;  ? MASTECTOMY Right 02/15/2015  ? modified  ? MODIFIED MASTECTOMY Right  02/15/2015  ? Procedure: RIGHT MODIFIED RADICAL MASTECTOMY;  Surgeon: Stark Klein, MD;  Location: [facility];  Service: General;  Laterality: Right;  ? ORIF WRIST FRACTURE Left 05/11/2019  ? Procedure: OPEN REDUCTION INTERNAL FIXATION (ORIF) left distal radius fracture;  Surgeon: Renette Butters, MD;  Location[facility];  Service: Orthopedics;  Laterality: Left;  with block  ? PORT-A-CATH REMOVAL Left 10/10/2015  ? Procedure: PORT REMOVAL;  Surgeon: Stark Klein, MD;  Location: [facility];  Service: General;  Laterality: Left;  ? PORTACATH PLACEMENT Left 08/02/2014  ? Procedure: INSERTION PORT-A-CATH;  Surgeon: Stark Klein, MD;  Location: [facility];  Service: General;  Laterality: Left;  ? PORTACATH PLACEMENT N/A 11/05/2016  ? Procedure: INSERTION PORT-A-CATH;  Surgeon: Stark Klein, MD;  Location: [facility];  Service: General;  Laterality: N/A;  ? RETINAL LASER PROCEDURE    ? retina tear on left eye  ? ROTATOR CUFF REPAIR Right 2005  ? SCAR REVISION Right 12/08/2015  ? Procedure: COMPLEX REPAIR RIGHT CHEST 10-15CM;  Surgeon: [name], MD;  Location: [facility];  Service: Plastics;  Laterality: Right;  ? TOE SURGERY  1970s  ? bone spur  ? TRIGGER FINGER RELEASE    ? x3  ? ? ?Social History: ? reports that she has never smoked. She has never used smokeless tobacco. She reports current alcohol use of about 1.0 - 2.0 standard drink per week. She reports that she does not use drugs. ? ?Allergies: ?Allergies  ?Allergen Reactions  ? Nsaids Other (See Comments)  ?  H/o gastric bypass - avoid NSAIDs  ? Peanut Oil Itching and Other (See Comments)  ?  "Large amounts"  ? Tape Hives, Rash and Other (See Comments)  ?  Adhesives in bandaids  ? ? ?Family History:  ?Family History  ?Problem Relation Age of Onset  ? CVA Mother   ? Heart disease Mother   ? Sudden death Mother   ? Diabetes Father   ? Heart failure Father   ? Hypertension Father   ? Kidney disease Father   ? Obesity Father   ? Hypertension  Sister   ? Diabetes Brother   ? Breast cancer Paternal Grandmother 26  ? Diabetes Paternal Uncle   ? Ovarian cancer Neg Hx   ? ? ? ?Current Outpatient Medications:  ?  Accu-Chek Softclix Lancets lancets, 1 each by Other route 4 (four) times daily. Test blood sugars 4 x times daily, Disp: 200 each, Rfl: 1 ?  aspirin EC 81 MG tablet, Take 1 tablet (81 mg total) by mouth daily., Disp: 90 tablet, Rfl: 3 ?  Blood Glucose Calibration (ACCU-CHEK GUIDE CONTROL) LIQD, 1 Bottle by In Vitro route as needed., Disp: 1 each, Rfl: 0 ?  Blood Glucose Monitoring Suppl (ACCU-CHEK  GUIDE) w/Device KIT, 1 each by Does not apply route 4 (four) times daily. Use to test blood sugars 4x a day, Disp: 1 kit, Rfl: 0 ?  buPROPion (WELLBUTRIN SR) 200 MG 12 hr tablet, Take one tablet by mouth once daily., Disp: 90 tablet, Rfl: 0 ?  CALCIUM CITRATE-VITAMIN D PO, Take 1 tablet by mouth 3 (three) times daily. , Disp: , Rfl:  ?  carvedilol (COREG) 6.25 MG tablet, Take 1 tablet (6.25 mg total) by mouth 2 (two) times daily., Disp: 180 tablet, Rfl: 3 ?  cholecalciferol (VITAMIN D) 1000 units tablet, Take 1,000 Units by mouth daily., Disp: , Rfl:  ?  Continuous Blood Gluc Sensor (FREESTYLE LIBRE 2 SENSOR) MISC, , Disp: , Rfl:  ?  Cyanocobalamin (VITAMIN B-12 PO), Take 1 tablet by mouth every 14 (fourteen) days., Disp: , Rfl:  ?  diphenhydrAMINE (BENADRYL) 25 MG tablet, Take 25 mg by mouth daily as needed for allergies or sleep., Disp: , Rfl:  ?  Finerenone (KERENDIA) 10 MG TABS, Take 10 mg by mouth daily., Disp: 30 tablet, Rfl: 6 ?  glucose blood (ACCU-CHEK GUIDE) test strip, Use Accu Chek test strips to check blood sugar 4 times daily. DX:E11.21, Disp: 400 each, Rfl: 0 ?  insulin aspart (NOVOLOG) 100 UNIT/ML injection, Inject into the skin daily as needed for high blood sugar. Per patient, sliding scale with meals as needed, Disp: , Rfl:  ?  insulin glargine (LANTUS SOLOSTAR) 100 UNIT/ML Solostar Pen, Inject 8 Units into the skin daily., Disp: 7 mL,  Rfl: 3 ?  loratadine (CLARITIN) 10 MG tablet, Take 10 mg by mouth as needed for allergies., Disp: , Rfl:  ?  losartan (COZAAR) 100 MG tablet, 1 tablet, Disp: 90 tablet, Rfl: 0 ?  melatonin 5 MG TABS, Take 5 mg

## 2021-05-23 NOTE — Patient Instructions (Signed)
-  Nice seeing you today!! ? ?-See you back in 6 months for your physical. ?

## 2021-05-24 DIAGNOSIS — I10 Essential (primary) hypertension: Secondary | ICD-10-CM | POA: Diagnosis not present

## 2021-05-24 DIAGNOSIS — M25572 Pain in left ankle and joints of left foot: Secondary | ICD-10-CM | POA: Diagnosis not present

## 2021-05-24 DIAGNOSIS — M25671 Stiffness of right ankle, not elsewhere classified: Secondary | ICD-10-CM | POA: Diagnosis not present

## 2021-05-24 DIAGNOSIS — F339 Major depressive disorder, recurrent, unspecified: Secondary | ICD-10-CM | POA: Diagnosis not present

## 2021-05-24 DIAGNOSIS — M6259 Muscle wasting and atrophy, not elsewhere classified, multiple sites: Secondary | ICD-10-CM | POA: Diagnosis not present

## 2021-05-24 DIAGNOSIS — R269 Unspecified abnormalities of gait and mobility: Secondary | ICD-10-CM | POA: Diagnosis not present

## 2021-05-24 DIAGNOSIS — Z9181 History of falling: Secondary | ICD-10-CM | POA: Diagnosis not present

## 2021-05-24 DIAGNOSIS — E118 Type 2 diabetes mellitus with unspecified complications: Secondary | ICD-10-CM | POA: Diagnosis not present

## 2021-05-30 DIAGNOSIS — H31093 Other chorioretinal scars, bilateral: Secondary | ICD-10-CM | POA: Diagnosis not present

## 2021-05-30 DIAGNOSIS — H3582 Retinal ischemia: Secondary | ICD-10-CM | POA: Diagnosis not present

## 2021-05-30 DIAGNOSIS — E113513 Type 2 diabetes mellitus with proliferative diabetic retinopathy with macular edema, bilateral: Secondary | ICD-10-CM | POA: Diagnosis not present
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
## 2021-06-01 ENCOUNTER — Ambulatory Visit: Payer: Medicare PPO | Admitting: Neurology

## 2021-06-01 DIAGNOSIS — M6259 Muscle wasting and atrophy, not elsewhere classified, multiple sites: Secondary | ICD-10-CM | POA: Diagnosis not present

## 2021-06-01 DIAGNOSIS — M25572 Pain in left ankle and joints of left foot: Secondary | ICD-10-CM | POA: Diagnosis not present

## 2021-06-01 DIAGNOSIS — I10 Essential (primary) hypertension: Secondary | ICD-10-CM | POA: Diagnosis not present

## 2021-06-01 DIAGNOSIS — F339 Major depressive disorder, recurrent, unspecified: Secondary | ICD-10-CM | POA: Diagnosis not present

## 2021-06-01 DIAGNOSIS — M25671 Stiffness of right ankle, not elsewhere classified: Secondary | ICD-10-CM | POA: Diagnosis not present

## 2021-06-01 DIAGNOSIS — Z9181 History of falling: Secondary | ICD-10-CM | POA: Diagnosis not present

## 2021-06-01 DIAGNOSIS — R269 Unspecified abnormalities of gait and mobility: Secondary | ICD-10-CM | POA: Diagnosis not present

## 2021-06-01 DIAGNOSIS — E118 Type 2 diabetes mellitus with unspecified complications: Secondary | ICD-10-CM | POA: Diagnosis not present

## 2021-06-07 ENCOUNTER — Encounter (HOSPITAL_COMMUNITY)
Admission: RE | Admit: 2021-06-07 | Discharge: 2021-06-07 | Disposition: A | Discharge status: Home / Self Care | Payer: Medicare PPO | Source: Ambulatory Visit | Attending: Hematology and Oncology | Admitting: Hematology and Oncology

## 2021-06-07 DIAGNOSIS — C787 Secondary malignant neoplasm of liver and intrahepatic bile duct: Secondary | ICD-10-CM | POA: Diagnosis not present

## 2021-06-07 DIAGNOSIS — N200 Calculus of kidney: Secondary | ICD-10-CM | POA: Diagnosis not present

## 2021-06-07 DIAGNOSIS — C50411 Malignant neoplasm of upper-outer quadrant of right female breast: Secondary | ICD-10-CM | POA: Insufficient documentation

## 2021-06-07 DIAGNOSIS — K5730 Diverticulosis of large intestine without perforation or abscess without bleeding: Secondary | ICD-10-CM | POA: Diagnosis not present

## 2021-06-07 DIAGNOSIS — D259 Leiomyoma of uterus, unspecified: Secondary | ICD-10-CM | POA: Insufficient documentation

## 2021-06-07 DIAGNOSIS — Z171 Estrogen receptor negative status [ER-]: Secondary | ICD-10-CM | POA: Diagnosis not present

## 2021-06-07 DIAGNOSIS — I2510 Atherosclerotic heart disease of native coronary artery without angina pectoris: Secondary | ICD-10-CM | POA: Diagnosis not present

## 2021-06-07 DIAGNOSIS — J432 Centrilobular emphysema: Secondary | ICD-10-CM | POA: Insufficient documentation

## 2021-06-07 DIAGNOSIS — K8020 Calculus of gallbladder without cholecystitis without obstruction: Secondary | ICD-10-CM | POA: Diagnosis not present

## 2021-06-07 DIAGNOSIS — Z853 Personal history of malignant neoplasm of breast: Secondary | ICD-10-CM | POA: Diagnosis not present

## 2021-06-07 DIAGNOSIS — Z9011 Acquired absence of right breast and nipple: Secondary | ICD-10-CM | POA: Insufficient documentation

## 2021-06-07 DIAGNOSIS — K449 Diaphragmatic hernia without obstruction or gangrene: Secondary | ICD-10-CM | POA: Diagnosis not present

## 2021-06-07 DIAGNOSIS — I700 Atherosclerosis of aorta: Secondary | ICD-10-CM | POA: Diagnosis not present

## 2021-06-07 DIAGNOSIS — E1165 Type 2 diabetes mellitus with hyperglycemia: Secondary | ICD-10-CM | POA: Diagnosis not present

## 2021-06-07 DIAGNOSIS — Z9221 Personal history of antineoplastic chemotherapy: Secondary | ICD-10-CM | POA: Diagnosis not present

## 2021-06-07 LAB — GLUCOSE, CAPILLARY: Glucose-Capillary: 77 mg/dL (ref 70–99)

## 2021-06-07 MED ORDER — FLUDEOXYGLUCOSE F - 18 (FDG) INJECTION
8.5100 | Freq: Once | INTRAVENOUS | Status: AC | PRN
  Administered 2021-06-07: 8.51 via INTRAVENOUS

## 2021-06-11 ENCOUNTER — Other Ambulatory Visit: Payer: Self-pay

## 2021-06-11 ENCOUNTER — Inpatient Hospital Stay: Payer: Medicare PPO | Attending: Oncology | Admitting: Hematology and Oncology

## 2021-06-11 ENCOUNTER — Inpatient Hospital Stay: Payer: Medicare PPO

## 2021-06-11 VITALS — BP 124/42 | HR 61 | Temp 97.8°F | Resp 16 | Ht 69.0 in | Wt 171.7 lb

## 2021-06-11 DIAGNOSIS — K5904 Chronic idiopathic constipation: Secondary | ICD-10-CM | POA: Insufficient documentation

## 2021-06-11 DIAGNOSIS — C50411 Malignant neoplasm of upper-outer quadrant of right female breast: Secondary | ICD-10-CM

## 2021-06-11 DIAGNOSIS — Z171 Estrogen receptor negative status [ER-]: Secondary | ICD-10-CM | POA: Insufficient documentation

## 2021-06-11 DIAGNOSIS — Z9011 Acquired absence of right breast and nipple: Secondary | ICD-10-CM | POA: Diagnosis not present

## 2021-06-11 DIAGNOSIS — C787 Secondary malignant neoplasm of liver and intrahepatic bile duct: Secondary | ICD-10-CM | POA: Diagnosis not present

## 2021-06-11 DIAGNOSIS — N189 Chronic kidney disease, unspecified: Secondary | ICD-10-CM | POA: Diagnosis not present

## 2021-06-11 DIAGNOSIS — C50811 Malignant neoplasm of overlapping sites of right female breast: Secondary | ICD-10-CM | POA: Insufficient documentation

## 2021-06-11 DIAGNOSIS — Z9221 Personal history of antineoplastic chemotherapy: Secondary | ICD-10-CM | POA: Diagnosis not present

## 2021-06-11 DIAGNOSIS — Z5112 Encounter for antineoplastic immunotherapy: Secondary | ICD-10-CM | POA: Insufficient documentation

## 2021-06-11 DIAGNOSIS — Z923 Personal history of irradiation: Secondary | ICD-10-CM | POA: Insufficient documentation

## 2021-06-11 MED ORDER — LORAZEPAM 0.5 MG PO TABS: 0.5000 mg | ORAL_TABLET | Freq: Four times a day (QID) | ORAL | 0 refills | Status: DC | PRN

## 2021-06-11 MED ORDER — ONDANSETRON HCL 8 MG PO TABS: 8.0000 mg | ORAL_TABLET | Freq: Two times a day (BID) | ORAL | 1 refills | Status: DC | PRN

## 2021-06-11 MED ORDER — PROCHLORPERAZINE MALEATE 10 MG PO TABS: 10.0000 mg | ORAL_TABLET | Freq: Four times a day (QID) | ORAL | 1 refills | Status: DC | PRN

## 2021-06-11 MED ORDER — LIDOCAINE-PRILOCAINE 2.5-2.5 % EX CREA: TOPICAL_CREAM | CUTANEOUS | 3 refills | Status: DC

## 2021-06-11 NOTE — Progress Notes (Signed)
DISCONTINUE OFF PATHWAY REGIMEN - Breast   [id number]:Trastuzumab and hyaluronidase-oysk 600 mg/10,000 units SUBQ D1 q21 Days:   A cycle is every 21 days:     Trastuzumab and hyaluronidase-oysk   **Always confirm dose/schedule in your pharmacy ordering system**  REASON: Disease Progression PRIOR TREATMENT: Off Pathway: Trastuzumab and hyaluronidase-oysk 600 mg/10,000 units SUBQ D1 q21 Days TREATMENT RESPONSE: Progressive Disease (PD)  START ON PATHWAY REGIMEN - Breast     A cycle is every 21 days:     Fam-trastuzumab deruxtecan-nxki   **Always confirm dose/schedule in your pharmacy ordering system**  Patient Characteristics: Distant Metastases or Locoregional Recurrent Disease - Unresected or Locally Advanced Unresectable Disease Progressing after Neoadjuvant and Local Therapies, HER2 Positive, ER Negative/Unknown, Chemotherapy, Fourth Line and Beyond, Fam-trastuzumab  Deruxtecan or Tucatinib-based Therapy Indicated Therapeutic Status: Distant Metastases HER2 Status: Positive (+) ER Status: Negative (-) PR Status: Negative (-) Line of Therapy: Fourth Line and Beyond Therapy Indicated: Fam-trastuzumab Deruxtecan or Tucatinib-based Therapy Indicated Intent of Therapy: Non-Curative / Palliative Intent, Discussed with Patient

## 2021-06-11 NOTE — Assessment & Plan Note (Signed)
07/19/2014: Right breast cancer grade 2 IDC ER/PR negative HER2 positive Ki-67 90% treated with neoadjuvant chemo with TCHP x6 cycles followed by Herceptin maintenance, right lumpectomy had residual disease 4 cm 1/3 lymph node positive, ER/PR negative, adjuvant radiation and capecitabine completed 09/27/2015 12/06/2015: Recurrence: Right subpectoral lymph node and subcutaneous nodule, liver mass (biopsy adenocarcinoma consistent with upper GI or pancreaticobiliary primary, HER2 negative, ER negative) July 2019: Metastatic disease: TDM 1 followed by Herceptin maintenance,pertuzumabadded 10/06/2018, Abraxane 12/31/2017-09/01/2018, Herceptin and pertuzumab maintenance  Current treatment: Enhertu started 9/15/2022switched to Herceptin maintenance 02/19/2021 Herceptintoxicities:no adverse effects  02/04/2021: PET CT scan: Complete metabolic response, no evidence of hypermetabolic disease in the liver (mass measured 2.7 cm previously is now 1.5 cm with no uptake)  06/08/2021: PET CT scan: Liver metastases.  Right hepatic lobe mass measures 6 cm (previously it was 1.5 cm), medial to this there is a 1 cm lesion with SUV of 5.3, mild right subpectoral lymph node hypermetabolism, sigmoid focal hypermetabolism is nonspecific  Discussion: Based upon progression of disease on the PET CT scan in the liver, I recommend switching her back to Enhertu (since she had an excellent response to initial treatment)  Acute on chronic constipation: Patient is fine to see gastroenterology.  Chronic renal failure: Today's creatinine is much better than before.  She was approached by a molecular diagnostic company to run biomarker analysis.   Return to clinic to change her treatment to [facility]

## 2021-06-12 ENCOUNTER — Telehealth: Payer: Self-pay | Admitting: Hematology and Oncology

## 2021-06-12 NOTE — Telephone Encounter (Signed)
Scheduled appointment per 5/22 los. Patient is aware.

## 2021-06-13 ENCOUNTER — Encounter: Payer: Self-pay | Admitting: Podiatry

## 2021-06-13 ENCOUNTER — Ambulatory Visit (INDEPENDENT_AMBULATORY_CARE_PROVIDER_SITE_OTHER): Payer: Medicare PPO | Admitting: Podiatry

## 2021-06-13 DIAGNOSIS — N184 Chronic kidney disease, stage 4 (severe): Secondary | ICD-10-CM

## 2021-06-13 DIAGNOSIS — M79675 Pain in left toe(s): Secondary | ICD-10-CM

## 2021-06-13 DIAGNOSIS — B351 Tinea unguium: Secondary | ICD-10-CM | POA: Diagnosis not present

## 2021-06-13 DIAGNOSIS — M21611 Bunion of right foot: Secondary | ICD-10-CM | POA: Diagnosis not present

## 2021-06-13 DIAGNOSIS — M21372 Foot drop, left foot: Secondary | ICD-10-CM | POA: Diagnosis not present

## 2021-06-13 DIAGNOSIS — M2011 Hallux valgus (acquired), right foot: Secondary | ICD-10-CM | POA: Diagnosis not present

## 2021-06-13 DIAGNOSIS — E1149 Type 2 diabetes mellitus with other diabetic neurological complication: Secondary | ICD-10-CM

## 2021-06-13 DIAGNOSIS — M79674 Pain in right toe(s): Secondary | ICD-10-CM

## 2021-06-13 DIAGNOSIS — E1140 Type 2 diabetes mellitus with diabetic neuropathy, unspecified: Secondary | ICD-10-CM

## 2021-06-13 NOTE — Progress Notes (Signed)
This patient returns to my office for at risk foot care.  This patient requires this care by a professional since this patient will be at risk due to having diabetic neuropathy.    This patient is unable to cut nails herself since the patient cannot reach her nails.These nails are painful walking and wearing shoes.  This patient presents for at risk foot care today.  General Appearance  Alert, conversant and in no acute stress.  Vascular  Dorsalis pedis and posterior tibial  pulses are palpable  bilaterally.  Capillary return is within normal limits  bilaterally. Temperature is within normal limits  bilaterally.  Neurologic  Senn-Weinstein monofilament wire test absent  bilaterally. Muscle power within normal limits bilaterally.  Nails Thick disfigured discolored nails with subungual debris  hallux toenails bilaterally.   No evidence of bacterial infection or drainage bilaterally.  Orthopedic  No limitations of motion  feet .  No crepitus or effusions noted.  No bony pathology or digital deformities noted.  HAV.    Hammer toes  Skin  normotropic skin with no porokeratosis noted bilaterally.  No signs of infections or ulcers noted.     Onychomycosis  Pain in right toes  Pain in left toes  HAV  B/L  Hammer toes  B/L.  Consent was obtained for treatment procedures.   Mechanical debridement of nails 1-5  bilaterally performed with a nail nipper.  Filed with dremel without incident.  Nails were done as a courtesy.Discussed bunion surgery with this patient and recommended seeing Dr.  Paulla Dolly for second opinion concerning her surgery.   Return office visit   10 weeks                  Told patient to return for periodic foot care and evaluation due to potential at risk complications.   Gardiner Barefoot DPM

## 2021-06-14 DIAGNOSIS — Z9181 History of falling: Secondary | ICD-10-CM | POA: Diagnosis not present

## 2021-06-14 DIAGNOSIS — E118 Type 2 diabetes mellitus with unspecified complications: Secondary | ICD-10-CM | POA: Diagnosis not present

## 2021-06-14 DIAGNOSIS — I10 Essential (primary) hypertension: Secondary | ICD-10-CM | POA: Diagnosis not present

## 2021-06-14 DIAGNOSIS — M25671 Stiffness of right ankle, not elsewhere classified: Secondary | ICD-10-CM | POA: Diagnosis not present

## 2021-06-14 DIAGNOSIS — M6259 Muscle wasting and atrophy, not elsewhere classified, multiple sites: Secondary | ICD-10-CM | POA: Diagnosis not present

## 2021-06-14 DIAGNOSIS — R269 Unspecified abnormalities of gait and mobility: Secondary | ICD-10-CM | POA: Diagnosis not present

## 2021-06-14 DIAGNOSIS — F339 Major depressive disorder, recurrent, unspecified: Secondary | ICD-10-CM | POA: Diagnosis not present

## 2021-06-14 DIAGNOSIS — M25572 Pain in left ankle and joints of left foot: Secondary | ICD-10-CM | POA: Diagnosis not present

## 2021-06-20 ENCOUNTER — Inpatient Hospital Stay: Payer: Medicare PPO

## 2021-06-20 ENCOUNTER — Other Ambulatory Visit: Payer: Self-pay

## 2021-06-20 VITALS — BP 99/57 | HR 52 | Temp 97.7°F | Wt 174.5 lb

## 2021-06-20 DIAGNOSIS — C50411 Malignant neoplasm of upper-outer quadrant of right female breast: Secondary | ICD-10-CM

## 2021-06-20 DIAGNOSIS — Z923 Personal history of irradiation: Secondary | ICD-10-CM | POA: Diagnosis not present

## 2021-06-20 DIAGNOSIS — Z95828 Presence of other vascular implants and grafts: Secondary | ICD-10-CM

## 2021-06-20 DIAGNOSIS — C50811 Malignant neoplasm of overlapping sites of right female breast: Secondary | ICD-10-CM | POA: Diagnosis not present

## 2021-06-20 DIAGNOSIS — N189 Chronic kidney disease, unspecified: Secondary | ICD-10-CM | POA: Diagnosis not present

## 2021-06-20 DIAGNOSIS — K5904 Chronic idiopathic constipation: Secondary | ICD-10-CM | POA: Diagnosis not present

## 2021-06-20 DIAGNOSIS — C787 Secondary malignant neoplasm of liver and intrahepatic bile duct: Secondary | ICD-10-CM | POA: Diagnosis not present

## 2021-06-20 DIAGNOSIS — Z171 Estrogen receptor negative status [ER-]: Secondary | ICD-10-CM | POA: Diagnosis not present

## 2021-06-20 DIAGNOSIS — Z9011 Acquired absence of right breast and nipple: Secondary | ICD-10-CM | POA: Diagnosis not present

## 2021-06-20 DIAGNOSIS — Z9221 Personal history of antineoplastic chemotherapy: Secondary | ICD-10-CM | POA: Diagnosis not present

## 2021-06-20 DIAGNOSIS — Z5112 Encounter for antineoplastic immunotherapy: Secondary | ICD-10-CM | POA: Diagnosis not present

## 2021-06-20 LAB — CMP (CANCER CENTER ONLY)
ALT: 14 U/L (ref 0–44)
AST: 26 U/L (ref 15–41)
Albumin: 3.9 g/dL (ref 3.5–5.0)
Alkaline Phosphatase: 51 U/L (ref 38–126)
Anion gap: 5 (ref 5–15)
BUN: 46 mg/dL — ABNORMAL HIGH (ref 8–23)
CO2: 29 mmol/L (ref 22–32)
Calcium: 9.4 mg/dL (ref 8.9–10.3)
Chloride: 106 mmol/L (ref 98–111)
Creatinine: 2.09 mg/dL — ABNORMAL HIGH (ref 0.44–1.00)
GFR, Estimated: 25 mL/min — ABNORMAL LOW (ref >60)
Glucose, Bld: 146 mg/dL — ABNORMAL HIGH (ref 70–99)
Potassium: 4.7 mmol/L (ref 3.5–5.1)
Sodium: 140 mmol/L (ref 135–145)
Total Bilirubin: 0.5 mg/dL (ref 0.3–1.2)
Total Protein: 6.3 g/dL — ABNORMAL LOW (ref 6.5–8.1)

## 2021-06-20 LAB — CBC WITH DIFFERENTIAL (CANCER CENTER ONLY)
Abs Immature Granulocytes: 0.01 10*3/uL (ref 0.00–0.07)
Basophils Absolute: 0 10*3/uL (ref 0.0–0.1)
Basophils Relative: 1 %
Eosinophils Absolute: 0.1 10*3/uL (ref 0.0–0.5)
Eosinophils Relative: 3 %
HCT: 32.4 % — ABNORMAL LOW (ref 36.0–46.0)
Hemoglobin: 10.9 g/dL — ABNORMAL LOW (ref 12.0–15.0)
Immature Granulocytes: 0 %
Lymphocytes Relative: 23 %
Lymphs Abs: 1 10*3/uL (ref 0.7–4.0)
MCH: 31.7 pg (ref 26.0–34.0)
MCHC: 33.6 g/dL (ref 30.0–36.0)
MCV: 94.2 fL (ref 80.0–100.0)
Monocytes Absolute: 0.3 10*3/uL (ref 0.1–1.0)
Monocytes Relative: 8 %
Neutro Abs: 2.9 10*3/uL (ref 1.7–7.7)
Neutrophils Relative %: 65 %
Platelet Count: 159 10*3/uL (ref 150–400)
RBC: 3.44 MIL/uL — ABNORMAL LOW (ref 3.87–5.11)
RDW: 13.2 % (ref 11.5–15.5)
WBC Count: 4.4 10*3/uL (ref 4.0–10.5)
nRBC: 0 % (ref 0.0–0.2)

## 2021-06-20 LAB — CEA (IN HOUSE-CHCC): CEA (CHCC-In House): 173 ng/mL — ABNORMAL HIGH (ref 0.00–5.00)

## 2021-06-20 MED ORDER — HEPARIN SOD (PORK) LOCK FLUSH 100 UNIT/ML IV SOLN
500.0000 [IU] | Freq: Once | INTRAVENOUS | Status: AC | PRN
  Administered 2021-06-20: 500 [IU]

## 2021-06-20 MED ORDER — DEXAMETHASONE SODIUM PHOSPHATE 10 MG/ML IJ SOLN
4.0000 mg | Freq: Once | INTRAMUSCULAR | Status: AC
  Administered 2021-06-20: 4 mg via INTRAVENOUS
  Filled 2021-06-20: qty 1

## 2021-06-20 MED ORDER — SODIUM CHLORIDE 0.9 % IV SOLN
150.0000 mg | Freq: Once | INTRAVENOUS | Status: AC
  Administered 2021-06-20: 150 mg via INTRAVENOUS
  Filled 2021-06-20: qty 150

## 2021-06-20 MED ORDER — DEXTROSE 5 % IV SOLN: Freq: Once | INTRAVENOUS | Status: AC

## 2021-06-20 MED ORDER — SODIUM CHLORIDE 0.9 % IV SOLN: 4.0000 mg | Freq: Once | INTRAVENOUS | Status: DC

## 2021-06-20 MED ORDER — PALONOSETRON HCL INJECTION 0.25 MG/5ML
0.2500 mg | Freq: Once | INTRAVENOUS | Status: AC
  Administered 2021-06-20: 0.25 mg via INTRAVENOUS
  Filled 2021-06-20: qty 5

## 2021-06-20 MED ORDER — SODIUM CHLORIDE 0.9% FLUSH
10.0000 mL | INTRAVENOUS | Status: DC | PRN
  Administered 2021-06-20: 10 mL

## 2021-06-20 MED ORDER — DIPHENHYDRAMINE HCL 25 MG PO CAPS
25.0000 mg | ORAL_CAPSULE | Freq: Once | ORAL | Status: AC
  Administered 2021-06-20: 25 mg via ORAL
  Filled 2021-06-20: qty 1

## 2021-06-20 MED ORDER — FAM-TRASTUZUMAB DERUXTECAN-NXKI CHEMO 100 MG IV SOLR
4.4000 mg/kg | Freq: Once | INTRAVENOUS | Status: AC
  Administered 2021-06-20: 342 mg via INTRAVENOUS
  Filled 2021-06-20: qty 17.1

## 2021-06-20 MED ORDER — ACETAMINOPHEN 325 MG PO TABS
650.0000 mg | ORAL_TABLET | Freq: Once | ORAL | Status: AC
  Administered 2021-06-20: 650 mg via ORAL
  Filled 2021-06-20: qty 2

## 2021-06-20 MED ORDER — SODIUM CHLORIDE 0.9% FLUSH
10.0000 mL | Freq: Once | INTRAVENOUS | Status: AC
  Administered 2021-06-20: 10 mL

## 2021-06-20 NOTE — Progress Notes (Signed)
OK to treat despite elevated creat. Per Dr Lindi Adie.

## 2021-06-23 ENCOUNTER — Other Ambulatory Visit (HOSPITAL_COMMUNITY): Payer: Self-pay | Admitting: Cardiology

## 2021-06-26 DIAGNOSIS — Z9181 History of falling: Secondary | ICD-10-CM | POA: Diagnosis not present

## 2021-06-26 DIAGNOSIS — M25572 Pain in left ankle and joints of left foot: Secondary | ICD-10-CM | POA: Diagnosis not present

## 2021-06-26 DIAGNOSIS — I10 Essential (primary) hypertension: Secondary | ICD-10-CM | POA: Diagnosis not present

## 2021-06-26 DIAGNOSIS — M25671 Stiffness of right ankle, not elsewhere classified: Secondary | ICD-10-CM | POA: Diagnosis not present

## 2021-06-26 DIAGNOSIS — M6259 Muscle wasting and atrophy, not elsewhere classified, multiple sites: Secondary | ICD-10-CM | POA: Diagnosis not present

## 2021-06-26 DIAGNOSIS — E118 Type 2 diabetes mellitus with unspecified complications: Secondary | ICD-10-CM | POA: Diagnosis not present

## 2021-06-26 DIAGNOSIS — R269 Unspecified abnormalities of gait and mobility: Secondary | ICD-10-CM | POA: Diagnosis not present

## 2021-06-26 DIAGNOSIS — F339 Major depressive disorder, recurrent, unspecified: Secondary | ICD-10-CM | POA: Diagnosis not present

## 2021-06-28 NOTE — Progress Notes (Signed)
Patient Care Team: Isaac Bliss, Rayford Halsted, MD as PCP - General (Internal Medicine) Larey Dresser, MD as PCP - Advanced Heart Failure (Cardiology) Stark Klein, MD as Consulting Physician (General Surgery) Jacelyn Pi, MD as Consulting Physician (Endocrinology) [name], MD as Consulting Physician (Plastic Surgery) Gery Pray, MD as Consulting Physician (Radiation Oncology) Juanita Craver, MD as Consulting Physician (Gastroenterology) Alda Berthold, DO as Consulting Physician (Neurology) Starlyn Skeans, MD as Consulting Physician (Family Medicine) Sherlynn Stalls, MD as Consulting Physician (Ophthalmology) Salvadore Dom, MD as Consulting Physician (Obstetrics and Gynecology) Raina Mina, RPH-CPP (Pharmacist) Nicholas Lose, MD as Consulting Physician (Hematology and Oncology)  DIAGNOSIS:  Encounter Diagnosis  Name Primary?   Malignant neoplasm of upper-outer quadrant of right breast in female, estrogen receptor negative ([place]) Yes    SUMMARY OF ONCOLOGIC HISTORY: Oncology History  Breast cancer of upper-outer quadrant of right female breast ([place])  07/19/2014 Initial Biopsy   (R) breast needle biopsy (10:00): IDC, DCIS, grade 2. ER-, PR-, HER2+ (ratio 2.42). Ki67 90%.    07/22/2014 Initial Diagnosis   Breast cancer of upper-outer quadrant of right female breast   08/08/2014 - 11/21/2014 Neo-Adjuvant Chemotherapy   Taxotere/Carbo/Herceptin/Perjeta x 6 cycles.    12/12/2014 - 10/02/2015 Chemotherapy   Maintenance Herceptin (to complete 1 year of therapy).    12/27/2014 Surgery   (R) lumpectomy with SLNB [facility]): IDC, grade 2, spanning 4 cm, associated high grade DCIS. Negative margins. 1/3 SLN (+).   ER/PR repeated and remain negative.   ypT2, ypN1a: Stage IIB   01/06/2015 Surgery   Bilateral breast mammoplasty ([place]): Right breast path with intralymphatic emboli of ductal carcinoma. Left breast benign.    02/02/2015 Imaging   CT chest: No  findings of metastatic disease in the chest. Right axillary fluid density lesion likely postoperative seroma or hematoma. Small left subpectoral nodes warrant followup attention.   02/02/2015 Imaging   Bone scan: No scintigraphic evidence of osseous metastatic disease   02/16/2015 Surgery   (R) mastectomy and ALND [facility]): Scattered foci of high grade DCIS, scattered intralymphatic tumor emboli. Margins negative. 0/15 LNs.    04/05/2015 - 05/19/2015 Radiation Therapy   Adjuvant XRT ([place]). The right chest wall was treated to 50.4 Gy in 28 fractions at 1.8 Gy per fraction.  The right mastectomy scar was treated to 10 Gy in 5 fractions at 2 Gy per fraction.    12/06/2015 PET scan   Right subpectoral lymph node measuring 9 mm and exhibits malignant range FDG uptake, suspicious for metastatic adenopathy. Subcutaneous nodule within the upper left chest wall exhibits malignant range uptake, which is indeterminate; this is at the site of previous Port-A-Cath and is favored to represent postsurgical change. Indeterminate left adrenal nodules which exhibit mild uptake, favored to represent benign adenoma.   08/11/2017 - 11/03/2017 Chemotherapy   The patient had ado-trastuzumab emtansine (KADCYLA) 320 mg in sodium chloride 0.9 % 250 mL chemo infusion, 3.6 mg/kg = 320 mg, Intravenous, Once, 4 of 5 cycles Administration: 320 mg (08/12/2017), 320 mg (09/01/2017), 320 mg (09/23/2017)  for chemotherapy treatment.    11/18/2017 - 08/30/2020 Chemotherapy   The patient had trastuzumab (HERCEPTIN) 546 mg in sodium chloride 0.9 % 250 mL chemo infusion, 6 mg/kg = 546 mg (100 % of original dose 6 mg/kg), Intravenous,  Once, 2 of 2 cycles Dose modification: 6 mg/kg (original dose 6 mg/kg, Cycle 1, Reason: Provider Judgment) Administration: 546 mg (11/18/2017), 546 mg (12/16/2017) pertuzumab (PERJETA) 420 mg in sodium chloride  0.9 % 250 mL chemo infusion, 420 mg (100 % of original dose 420 mg), Intravenous, Once, 15 of 15  cycles Dose modification: 420 mg (original dose 420 mg, Cycle 18), 420 mg (original dose 420 mg, Cycle 27) Administration: 420 mg (10/06/2018), 420 mg (10/27/2018), 420 mg (11/18/2018), 420 mg (12/08/2018), 420 mg (12/29/2018), 420 mg (01/19/2019), 420 mg (02/16/2019), 420 mg (03/16/2019), 420 mg (04/13/2019), 420 mg (06/08/2019), 420 mg (07/06/2019), 420 mg (08/03/2019), 420 mg (08/31/2019), 420 mg (09/28/2019), 420 mg (05/19/2019) trastuzumab-anns (KANJINTI) 546 mg in sodium chloride 0.9 % 250 mL chemo infusion, 6 mg/kg = 546 mg (100 % of original dose 6 mg/kg), Intravenous,  Once, 30 of 30 cycles Dose modification: 6 mg/kg (original dose 6 mg/kg, Cycle 3, Reason: Other (see comments), Comment: requested step therapy by [facility] insurance), 4 mg/kg (original dose 6 mg/kg, Cycle 9, Reason: Provider Judgment), 6 mg/kg (original dose 6 mg/kg, Cycle 17, Reason: Provider Judgment), 4 mg/kg (original dose 6 mg/kg, Cycle 27, Reason: Provider Judgment), 6 mg/kg (original dose 6 mg/kg, Cycle 27, Reason: Provider Judgment) Administration: 546 mg (01/13/2018), 546 mg (02/10/2018), 546 mg (03/03/2018), 546 mg (03/24/2018), 546 mg (04/14/2018), 546 mg (05/05/2018), 357 mg (05/26/2018), 357 mg (06/09/2018), 357 mg (06/23/2018), 357 mg (07/07/2018), 357 mg (07/21/2018), 357 mg (08/04/2018), 357 mg (08/18/2018), 357 mg (09/01/2018), 546 mg (09/15/2018), 546 mg (10/06/2018), 546 mg (10/27/2018), 546 mg (11/18/2018), 546 mg (12/08/2018), 546 mg (12/29/2018), 546 mg (01/19/2019), 546 mg (02/16/2019), 480 mg (03/16/2019), 483 mg (04/13/2019), 450 mg (06/08/2019), 450 mg (07/06/2019), 450 mg (08/03/2019), 450 mg (08/31/2019), 450 mg (09/28/2019)  for chemotherapy treatment.    12/31/2017 - 09/01/2018 Chemotherapy   The patient had PACLitaxel-protein bound (ABRAXANE) chemo infusion 175 mg, 80 mg/m2 = 175 mg (100 % of original dose 80 mg/m2), Intravenous,  Once, 5 of 10 cycles Dose modification: 80 mg/m2 (original dose 80 mg/m2, Cycle 11) Administration: 175 mg  (07/07/2018), 175 mg (07/21/2018), 175 mg (08/04/2018), 175 mg (08/18/2018), 175 mg (09/01/2018) PACLitaxel-protein bound (ABRAXANE) chemo infusion 200 mg, 100 mg/m2 = 200 mg, Intravenous, Once, 10 of 10 cycles Dose modification: 80 mg/m2 (original dose 100 mg/m2, Cycle 2, Reason: Provider Judgment), 80 mg/m2 (original dose 100 mg/m2, Cycle 2, Reason: Provider Judgment) Administration: 200 mg (12/31/2017), 200 mg (01/06/2018), 175 mg (01/20/2018), 175 mg (01/27/2018), 175 mg (02/10/2018), 175 mg (03/03/2018), 175 mg (03/10/2018), 175 mg (03/24/2018), 175 mg (04/07/2018), 175 mg (04/14/2018), 175 mg (04/28/2018), 175 mg (05/05/2018), 175 mg (05/26/2018), 175 mg (06/09/2018), 175 mg (05/19/2018)  for chemotherapy treatment.    10/05/2020 - 01/18/2021 Chemotherapy   Patient is on Treatment Plan : BREAST METASTATIC fam-trastuzumab deruxtecan-nxki (Enhertu) q21d     02/19/2021 - 05/14/2021 Chemotherapy   Patient is on Treatment Plan : BREAST Trastuzumab q21d x 13 cycles     06/20/2021 -  Chemotherapy   Patient is on Treatment Plan : BREAST METASTATIC fam-trastuzumab deruxtecan-nxki (Enhertu) q21d     Metastasis to liver ([place])  08/04/2017 Initial Diagnosis   Metastasis to liver ([place])   08/12/2017 - 10/14/2017 Chemotherapy   The patient had ado-trastuzumab emtansine (KADCYLA) 320 mg in sodium chloride 0.9 % 250 mL chemo infusion, 3.6 mg/kg = 320 mg, Intravenous, Once, 4 of 5 cycles Administration: 320 mg (08/12/2017), 320 mg (09/01/2017), 320 mg (09/23/2017)  for chemotherapy treatment.    11/18/2017 - 08/30/2020 Chemotherapy   The patient had trastuzumab (HERCEPTIN) 546 mg in sodium chloride 0.9 % 250 mL chemo infusion, 6 mg/kg = 546 mg (100 % of original dose  6 mg/kg), Intravenous,  Once, 2 of 2 cycles Dose modification: 6 mg/kg (original dose 6 mg/kg, Cycle 1, Reason: Provider Judgment) Administration: 546 mg (11/18/2017), 546 mg (12/16/2017) pertuzumab (PERJETA) 420 mg in sodium chloride 0.9 % 250 mL chemo infusion, 420 mg  (100 % of original dose 420 mg), Intravenous, Once, 15 of 15 cycles Dose modification: 420 mg (original dose 420 mg, Cycle 18), 420 mg (original dose 420 mg, Cycle 27) Administration: 420 mg (10/06/2018), 420 mg (10/27/2018), 420 mg (11/18/2018), 420 mg (12/08/2018), 420 mg (12/29/2018), 420 mg (01/19/2019), 420 mg (02/16/2019), 420 mg (03/16/2019), 420 mg (04/13/2019), 420 mg (06/08/2019), 420 mg (07/06/2019), 420 mg (08/03/2019), 420 mg (08/31/2019), 420 mg (09/28/2019), 420 mg (05/19/2019) trastuzumab-anns (KANJINTI) 546 mg in sodium chloride 0.9 % 250 mL chemo infusion, 6 mg/kg = 546 mg (100 % of original dose 6 mg/kg), Intravenous,  Once, 30 of 30 cycles Dose modification: 6 mg/kg (original dose 6 mg/kg, Cycle 3, Reason: Other (see comments), Comment: requested step therapy by [facility] insurance), 4 mg/kg (original dose 6 mg/kg, Cycle 9, Reason: Provider Judgment), 6 mg/kg (original dose 6 mg/kg, Cycle 17, Reason: Provider Judgment), 4 mg/kg (original dose 6 mg/kg, Cycle 27, Reason: Provider Judgment), 6 mg/kg (original dose 6 mg/kg, Cycle 27, Reason: Provider Judgment) Administration: 546 mg (01/13/2018), 546 mg (02/10/2018), 546 mg (03/03/2018), 546 mg (03/24/2018), 546 mg (04/14/2018), 546 mg (05/05/2018), 357 mg (05/26/2018), 357 mg (06/09/2018), 357 mg (06/23/2018), 357 mg (07/07/2018), 357 mg (07/21/2018), 357 mg (08/04/2018), 357 mg (08/18/2018), 357 mg (09/01/2018), 546 mg (09/15/2018), 546 mg (10/06/2018), 546 mg (10/27/2018), 546 mg (11/18/2018), 546 mg (12/08/2018), 546 mg (12/29/2018), 546 mg (01/19/2019), 546 mg (02/16/2019), 480 mg (03/16/2019), 483 mg (04/13/2019), 450 mg (06/08/2019), 450 mg (07/06/2019), 450 mg (08/03/2019), 450 mg (08/31/2019), 450 mg (09/28/2019)  for chemotherapy treatment.      CHIEF COMPLIANT: Cycle 2 Enhertu  INTERVAL HISTORY: Darlene [name] is a 71 y.o. with above-mentioned history of HER-2 positive, estrogen receptor negative breast cancer, was on chemotherapy with Enhertu. She presents to the  clinic today for follow-up. Denies any side effects. Denies no nausea constipation. States that she had some hair loss and she does have some mild fatigue.  ALLERGIES:  is allergic to nsaids and tape.  MEDICATIONS:  Current Outpatient Medications  Medication Sig Dispense Refill   Accu-Chek Softclix Lancets lancets 1 each by Other route 4 (four) times daily. Test blood sugars 4 x times daily 200 each 1   aspirin EC 81 MG tablet Take 1 tablet (81 mg total) by mouth daily. 90 tablet 3   Blood Glucose Calibration (ACCU-CHEK GUIDE CONTROL) LIQD 1 Bottle by In Vitro route as needed. 1 each 0   Blood Glucose Monitoring Suppl (ACCU-CHEK GUIDE) w/Device KIT 1 each by Does not apply route 4 (four) times daily. Use to test blood sugars 4x a day 1 kit 0   buPROPion (WELLBUTRIN SR) 200 MG 12 hr tablet Take one tablet by mouth once daily. 90 tablet 0   CALCIUM CITRATE-VITAMIN D PO Take 1 tablet by mouth 3 (three) times daily.      carvedilol (COREG) 6.25 MG tablet Take 1 tablet (6.25 mg total) by mouth 2 (two) times daily. 180 tablet 3   cholecalciferol (VITAMIN D) 1000 units tablet Take 1,000 Units by mouth daily.     Continuous Blood Gluc Sensor (FREESTYLE LIBRE 2 SENSOR) MISC      Cyanocobalamin (VITAMIN B-12 PO) Take 1 tablet by mouth every 14 (fourteen) days.  diphenhydrAMINE (BENADRYL) 25 MG tablet Take 25 mg by mouth daily as needed for allergies or sleep.     Finerenone (KERENDIA) 10 MG TABS Take 10 mg by mouth daily. 30 tablet 6   glucose blood (ACCU-CHEK GUIDE) test strip Use Accu Chek test strips to check blood sugar 4 times daily. DX:E11.21 400 each 0   insulin aspart (NOVOLOG) 100 UNIT/ML injection Inject into the skin daily as needed for high blood sugar. Per patient, sliding scale with meals as needed     insulin glargine (LANTUS SOLOSTAR) 100 UNIT/ML Solostar Pen Inject 8 Units into the skin daily. 7 mL 3   lidocaine-prilocaine (EMLA) cream Apply to affected area once 30 g 3   loratadine  (CLARITIN) 10 MG tablet Take 10 mg by mouth as needed for allergies.     LORazepam (ATIVAN) 0.5 MG tablet Take 1 tablet (0.5 mg total) by mouth every 6 (six) hours as needed (Nausea or vomiting). 30 tablet 0   losartan (COZAAR) 100 MG tablet 1 tablet 90 tablet 0   melatonin 5 MG TABS Take 5 mg by mouth at bedtime.     NEXLIZET 180-10 MG TABS TAKE 1 TABLET BY MOUTH DAILY. 30 tablet 11   omeprazole (PRILOSEC) 40 MG capsule Take 1 capsule (40 mg total) by mouth daily. 30 capsule 1   ondansetron (ZOFRAN) 8 MG tablet Take 1 tablet (8 mg total) by mouth every 8 (eight) hours as needed for nausea or vomiting. Start on day 3 after chemo. 30 tablet 2   ondansetron (ZOFRAN) 8 MG tablet Take 1 tablet (8 mg total) by mouth 2 (two) times daily as needed for refractory nausea / vomiting. Start on day 3 after chemo. 30 tablet 1   Pediatric Multivitamins-Iron (FLINTSTONES PLUS IRON) chewable tablet Chew 1 tablet by mouth 2 (two) times daily.     polyethylene glycol (MIRALAX / GLYCOLAX) 17 g packet Take 17 g by mouth daily.     Probiotic Product ([employer] PROBIOTIC PO) Take 1 capsule by mouth daily.      prochlorperazine (COMPAZINE) 10 MG tablet Take 1 tablet (10 mg total) by mouth every 6 (six) hours as needed (Nausea or vomiting). 30 tablet 1   Propylene Glycol (SYSTANE BALANCE OP) Place 1-2 drops into both eyes 3 (three) times daily as needed (for dry eyes).      SURE COMFORT PEN NEEDLES 32G X 4 MM MISC Use as instructed to inject insulin and Victoza daily. 200 each 0   tobramycin (TOBREX) 0.3 % ophthalmic ointment Place 1 application into both eyes as needed (Takes day before eye injections).     vortioxetine HBr (TRINTELLIX) 20 MG TABS tablet Take 1 tablet (20 mg total) by mouth at bedtime. 90 tablet 0   No current facility-administered medications for this visit.    PHYSICAL EXAMINATION: ECOG PERFORMANCE STATUS: 1 - Symptomatic but completely ambulatory  Vitals:   07/11/21 0912  BP: (!) 116/57  Pulse: (!)  50  Resp: 17  Temp: (!) 97.5 F (36.4 C)  SpO2: 96%   Filed Weights   07/11/21 0912  Weight: 174 lb 4.8 oz (79.1 kg)      LABORATORY DATA:  I have reviewed the data as listed    Latest Ref Rng & Units 06/20/2021    9:27 AM 05/14/2021    9:14 AM 03/19/2021    9:31 AM  CMP  Glucose 70 - 99 mg/dL 146  160  117   BUN 8 - 23 mg/dL 46  62  47   Creatinine 0.44 - 1.00 mg/dL 2.09  2.17  1.59   Sodium 135 - 145 mmol/L 140  141  141   Potassium 3.5 - 5.1 mmol/L 4.7  4.7  4.8   Chloride 98 - 111 mmol/L 106  110  108   CO2 22 - 32 mmol/L '29  25  29   ' Calcium 8.9 - 10.3 mg/dL 9.4  8.9  9.4   Total Protein 6.5 - 8.1 g/dL 6.3  6.1  6.1   Total Bilirubin 0.3 - 1.2 mg/dL 0.5  0.4  0.4   Alkaline Phos 38 - 126 U/L 51  45  39   AST 15 - 41 U/L '26  23  22   ' ALT 0 - 44 U/L '14  14  12     ' Lab Results  Component Value Date   WBC 3.5 (L) 07/11/2021   HGB 10.3 (L) 07/11/2021   HCT 30.4 (L) 07/11/2021   MCV 95.0 07/11/2021   PLT 183 07/11/2021   NEUTROABS 2.1 07/11/2021    ASSESSMENT & PLAN:  Breast cancer of upper-outer quadrant of right female breast ([place]) 07/19/2014: Right breast cancer grade 2 IDC ER/PR negative HER2 positive Ki-67 90% treated with neoadjuvant chemo with TCHP x6 cycles followed by Herceptin maintenance, right lumpectomy had residual disease 4 cm 1/3 lymph node positive, ER/PR negative, adjuvant radiation and capecitabine completed 09/27/2015 12/06/2015: Recurrence: Right subpectoral lymph node and subcutaneous nodule, liver mass (biopsy adenocarcinoma consistent with upper GI or pancreaticobiliary primary, HER2 negative, ER negative) July 2019: Metastatic disease: TDM 1 followed by Herceptin maintenance, pertuzumab added 10/06/2018, Abraxane 12/31/2017-09/01/2018, Herceptin and pertuzumab maintenance   Prior treatment: Enhertu started 10/05/2020 switched to Herceptin maintenance 02/19/2021 Herceptin toxicities:   no adverse effects   02/04/2021: PET CT scan: Complete metabolic  response, no evidence of hypermetabolic disease in the liver (mass measured 2.7 cm previously is now 1.5 cm with no uptake)  06/08/2021: PET CT scan: Liver metastases.  Right hepatic lobe mass measures 6 cm (previously it was 1.5 cm), medial to this there is a 1 cm lesion with SUV of 5.3, mild right subpectoral lymph node hypermetabolism, sigmoid focal hypermetabolism is nonspecific ------------------------------------------------------------------------------------------------------------------------------------------- Current treatment: Enhertu lower dosage 06/20/2021 today cycle 2   Enhertu toxicities: Chemo-induced anemia: Monitoring Mild leukopenia: Okay to treat  Denies any nausea or vomiting. Our plan is to perform 3 cycles of treatment and then perform CT chest abdomen pelvis.  Return to clinic in 3 weeks for cycle 3    Orders Placed This Encounter  Procedures   CT CHEST ABDOMEN PELVIS W CONTRAST    Standing Status:   Future    Standing Expiration Date:   07/12/2022    Order Specific Question:   Informed consent is required to be obtained if the patient has a GFR less than 90m/min (consider nephrology consult)    Answer:   Provider notified    Order Specific Question:   [facility]    [facility]   Order Specific Question:   Is Oral Contrast requested for this exam?    Answer:   Yes, Per Radiology protocol   The patient has a good understanding of the overall plan. she agrees with it. she will call with any problems that may develop before the next visit here. Total time spent: 30 mins including face to face time and time spent for planning, charting and co-ordination of care   Viinay K  Lindi Adie, MD 07/11/21    I Gardiner Coins am scribing for Dr. Lindi Adie  I have reviewed the above documentation for accuracy and completeness, and I agree with the above.

## 2021-06-29 DIAGNOSIS — E1122 Type 2 diabetes mellitus with diabetic chronic kidney disease: Secondary | ICD-10-CM | POA: Diagnosis not present

## 2021-06-29 DIAGNOSIS — D631 Anemia in chronic kidney disease: Secondary | ICD-10-CM | POA: Diagnosis not present

## 2021-06-29 DIAGNOSIS — N1832 Chronic kidney disease, stage 3b: Secondary | ICD-10-CM | POA: Diagnosis not present

## 2021-06-29 DIAGNOSIS — Z853 Personal history of malignant neoplasm of breast: Secondary | ICD-10-CM | POA: Diagnosis not present

## 2021-06-29 DIAGNOSIS — I129 Hypertensive chronic kidney disease with stage 1 through stage 4 chronic kidney disease, or unspecified chronic kidney disease: Secondary | ICD-10-CM | POA: Diagnosis not present

## 2021-07-08 DIAGNOSIS — E1165 Type 2 diabetes mellitus with hyperglycemia: Secondary | ICD-10-CM | POA: Diagnosis not present

## 2021-07-10 DIAGNOSIS — I10 Essential (primary) hypertension: Secondary | ICD-10-CM | POA: Diagnosis not present

## 2021-07-10 DIAGNOSIS — F339 Major depressive disorder, recurrent, unspecified: Secondary | ICD-10-CM | POA: Diagnosis not present

## 2021-07-10 DIAGNOSIS — M6259 Muscle wasting and atrophy, not elsewhere classified, multiple sites: Secondary | ICD-10-CM | POA: Diagnosis not present

## 2021-07-10 DIAGNOSIS — M25671 Stiffness of right ankle, not elsewhere classified: Secondary | ICD-10-CM | POA: Diagnosis not present

## 2021-07-10 DIAGNOSIS — R269 Unspecified abnormalities of gait and mobility: Secondary | ICD-10-CM | POA: Diagnosis not present

## 2021-07-10 DIAGNOSIS — E118 Type 2 diabetes mellitus with unspecified complications: Secondary | ICD-10-CM | POA: Diagnosis not present

## 2021-07-10 DIAGNOSIS — Z9181 History of falling: Secondary | ICD-10-CM | POA: Diagnosis not present

## 2021-07-10 DIAGNOSIS — M25572 Pain in left ankle and joints of left foot: Secondary | ICD-10-CM | POA: Diagnosis not present

## 2021-07-10 NOTE — Assessment & Plan Note (Signed)
07/19/2014: Right breast cancer grade 2 IDC ER/PR negative HER2 positive Ki-67 90% treated with neoadjuvant chemo with TCHP x6 cycles followed by Herceptin maintenance, right lumpectomy had residual disease 4 cm 1/3 lymph node positive, ER/PR negative, adjuvant radiation and capecitabine completed 09/27/2015 12/06/2015: Recurrence: Right subpectoral lymph node and subcutaneous nodule, liver mass (biopsy adenocarcinoma consistent with upper GI or pancreaticobiliary primary, HER2 negative, ER negative) July 2019: Metastatic disease: TDM 1 followed by Herceptin maintenance,pertuzumabadded 10/06/2018, Abraxane 12/31/2017-09/01/2018, Herceptin and pertuzumab maintenance  Prior treatment: Enhertu started 9/15/2022switched to Herceptin maintenance 02/19/2021 Herceptintoxicities:no adverse effects  02/04/2021: PET CT scan: Complete metabolic response, no evidence of hypermetabolic disease in the liver (mass measured 2.7 cm previously is now 1.5 cm with no uptake)  06/08/2021: PET CT scan: Liver metastases.  Right hepatic lobe mass measures 6 cm (previously it was 1.5 cm), medial to this there is a 1 cm lesion with SUV of 5.3, mild right subpectoral lymph node hypermetabolism, sigmoid focal hypermetabolism is nonspecific ------------------------------------------------------------------------------------------------------------------------------------------- Current treatment: Enhertu lower dosage 06/20/2021 today cycle 2  Enhertu toxicities:  Return to clinic in 3 weeks for cycle 3

## 2021-07-11 ENCOUNTER — Other Ambulatory Visit: Payer: Self-pay

## 2021-07-11 ENCOUNTER — Inpatient Hospital Stay: Payer: Medicare PPO | Attending: Oncology | Admitting: Hematology and Oncology

## 2021-07-11 ENCOUNTER — Inpatient Hospital Stay: Payer: Medicare PPO

## 2021-07-11 VITALS — BP 116/57 | HR 50 | Temp 97.5°F | Resp 17 | Ht 69.0 in | Wt 174.3 lb

## 2021-07-11 VITALS — BP 108/82 | HR 51 | Temp 97.7°F | Resp 18

## 2021-07-11 DIAGNOSIS — D6481 Anemia due to antineoplastic chemotherapy: Secondary | ICD-10-CM | POA: Diagnosis not present

## 2021-07-11 DIAGNOSIS — C50411 Malignant neoplasm of upper-outer quadrant of right female breast: Secondary | ICD-10-CM

## 2021-07-11 DIAGNOSIS — Z5112 Encounter for antineoplastic immunotherapy: Secondary | ICD-10-CM | POA: Diagnosis not present

## 2021-07-11 DIAGNOSIS — Z95828 Presence of other vascular implants and grafts: Secondary | ICD-10-CM

## 2021-07-11 DIAGNOSIS — C787 Secondary malignant neoplasm of liver and intrahepatic bile duct: Secondary | ICD-10-CM | POA: Diagnosis not present

## 2021-07-11 DIAGNOSIS — C50811 Malignant neoplasm of overlapping sites of right female breast: Secondary | ICD-10-CM | POA: Insufficient documentation

## 2021-07-11 DIAGNOSIS — D701 Agranulocytosis secondary to cancer chemotherapy: Secondary | ICD-10-CM | POA: Diagnosis not present

## 2021-07-11 DIAGNOSIS — Z171 Estrogen receptor negative status [ER-]: Secondary | ICD-10-CM | POA: Diagnosis not present

## 2021-07-11 LAB — CBC WITH DIFFERENTIAL (CANCER CENTER ONLY)
Abs Immature Granulocytes: 0.02 10*3/uL (ref 0.00–0.07)
Basophils Absolute: 0 10*3/uL (ref 0.0–0.1)
Basophils Relative: 1 %
Eosinophils Absolute: 0.1 10*3/uL (ref 0.0–0.5)
Eosinophils Relative: 4 %
HCT: 30.4 % — ABNORMAL LOW (ref 36.0–46.0)
Hemoglobin: 10.3 g/dL — ABNORMAL LOW (ref 12.0–15.0)
Immature Granulocytes: 1 %
Lymphocytes Relative: 26 %
Lymphs Abs: 0.9 10*3/uL (ref 0.7–4.0)
MCH: 32.2 pg (ref 26.0–34.0)
MCHC: 33.9 g/dL (ref 30.0–36.0)
MCV: 95 fL (ref 80.0–100.0)
Monocytes Absolute: 0.4 10*3/uL (ref 0.1–1.0)
Monocytes Relative: 11 %
Neutro Abs: 2.1 10*3/uL (ref 1.7–7.7)
Neutrophils Relative %: 57 %
Platelet Count: 183 10*3/uL (ref 150–400)
RBC: 3.2 MIL/uL — ABNORMAL LOW (ref 3.87–5.11)
RDW: 14 % (ref 11.5–15.5)
WBC Count: 3.5 10*3/uL — ABNORMAL LOW (ref 4.0–10.5)
nRBC: 0 % (ref 0.0–0.2)

## 2021-07-11 LAB — CMP (CANCER CENTER ONLY)
ALT: 13 U/L (ref 0–44)
AST: 27 U/L (ref 15–41)
Albumin: 3.9 g/dL (ref 3.5–5.0)
Alkaline Phosphatase: 49 U/L (ref 38–126)
Anion gap: 4 — ABNORMAL LOW (ref 5–15)
BUN: 42 mg/dL — ABNORMAL HIGH (ref 8–23)
CO2: 30 mmol/L (ref 22–32)
Calcium: 9.6 mg/dL (ref 8.9–10.3)
Chloride: 108 mmol/L (ref 98–111)
Creatinine: 2.02 mg/dL — ABNORMAL HIGH (ref 0.44–1.00)
GFR, Estimated: 26 mL/min — ABNORMAL LOW (ref >60)
Glucose, Bld: 101 mg/dL — ABNORMAL HIGH (ref 70–99)
Potassium: 4.7 mmol/L (ref 3.5–5.1)
Sodium: 142 mmol/L (ref 135–145)
Total Bilirubin: 0.4 mg/dL (ref 0.3–1.2)
Total Protein: 6.3 g/dL — ABNORMAL LOW (ref 6.5–8.1)

## 2021-07-11 LAB — CEA (IN HOUSE-CHCC): CEA (CHCC-In House): 308.74 ng/mL — ABNORMAL HIGH (ref 0.00–5.00)

## 2021-07-11 MED ORDER — DEXTROSE 5 % IV SOLN: Freq: Once | INTRAVENOUS | Status: AC

## 2021-07-11 MED ORDER — DIPHENHYDRAMINE HCL 25 MG PO CAPS
25.0000 mg | ORAL_CAPSULE | Freq: Once | ORAL | Status: AC
  Administered 2021-07-11: 25 mg via ORAL
  Filled 2021-07-11: qty 1

## 2021-07-11 MED ORDER — SODIUM CHLORIDE 0.9% FLUSH
10.0000 mL | Freq: Once | INTRAVENOUS | Status: AC
  Administered 2021-07-11: 10 mL

## 2021-07-11 MED ORDER — SODIUM CHLORIDE 0.9% FLUSH
10.0000 mL | INTRAVENOUS | Status: DC | PRN
  Administered 2021-07-11: 10 mL

## 2021-07-11 MED ORDER — FAM-TRASTUZUMAB DERUXTECAN-NXKI CHEMO 100 MG IV SOLR
4.4000 mg/kg | Freq: Once | INTRAVENOUS | Status: AC
  Administered 2021-07-11: 342 mg via INTRAVENOUS
  Filled 2021-07-11: qty 17.1

## 2021-07-11 MED ORDER — DEXAMETHASONE SODIUM PHOSPHATE 10 MG/ML IJ SOLN
4.0000 mg | Freq: Once | INTRAMUSCULAR | Status: AC
  Administered 2021-07-11: 4 mg via INTRAVENOUS
  Filled 2021-07-11: qty 1

## 2021-07-11 MED ORDER — PALONOSETRON HCL INJECTION 0.25 MG/5ML
0.2500 mg | Freq: Once | INTRAVENOUS | Status: AC
  Administered 2021-07-11: 0.25 mg via INTRAVENOUS
  Filled 2021-07-11: qty 5

## 2021-07-11 MED ORDER — HEPARIN SOD (PORK) LOCK FLUSH 100 UNIT/ML IV SOLN
500.0000 [IU] | Freq: Once | INTRAVENOUS | Status: AC | PRN
  Administered 2021-07-11: 500 [IU]

## 2021-07-11 MED ORDER — ACETAMINOPHEN 325 MG PO TABS
650.0000 mg | ORAL_TABLET | Freq: Once | ORAL | Status: AC
  Administered 2021-07-11: 650 mg via ORAL
  Filled 2021-07-11: qty 2

## 2021-07-11 MED ORDER — SODIUM CHLORIDE 0.9 % IV SOLN
150.0000 mg | Freq: Once | INTRAVENOUS | Status: AC
  Administered 2021-07-11: 150 mg via INTRAVENOUS
  Filled 2021-07-11: qty 150

## 2021-07-11 NOTE — Patient Instructions (Signed)
[facility] MEDICAL [facility]COLOGY  Discharge Instructions: Thank you for choosing [facility] to provide your oncology and hematology care.   If you have a lab appointment with the Cancer Center, please go directly to the Cancer Center and check in at the registration area.   Wear comfortable clothing and clothing appropriate for easy access to any Portacath or PICC line.   We strive to give you quality time with your provider. You may need to reschedule your appointment if you arrive late (15 or more minutes).  Arriving late affects you and other patients whose appointments are after yours.  Also, if you miss three or more appointments without notifying the office, you may be dismissed from the clinic at the provider's discretion.      For prescription refill requests, have your pharmacy contact our office and allow 72 hours for refills to be completed.    Today you received the following chemotherapy and/or immunotherapy agents: Enhertu      To help prevent nausea and vomiting after your treatment, we encourage you to take your nausea medication as directed.  BELOW ARE SYMPTOMS THAT SHOULD BE REPORTED IMMEDIATELY: *FEVER GREATER THAN 100.4 F (38 C) OR HIGHER *CHILLS OR SWEATING *NAUSEA AND VOMITING THAT IS NOT CONTROLLED WITH YOUR NAUSEA MEDICATION *UNUSUAL SHORTNESS OF BREATH *UNUSUAL BRUISING OR BLEEDING *URINARY PROBLEMS (pain or burning when urinating, or frequent urination) *BOWEL PROBLEMS (unusual diarrhea, constipation, pain near the anus) TENDERNESS IN MOUTH AND THROAT WITH OR WITHOUT PRESENCE OF ULCERS (sore throat, sores in mouth, or a toothache) UNUSUAL RASH, SWELLING OR PAIN  UNUSUAL VAGINAL DISCHARGE OR ITCHING   Items with * indicate a potential emergency and should be followed up as soon as possible or go to the Emergency Department if any problems should occur.  Please show the CHEMOTHERAPY ALERT CARD or IMMUNOTHERAPY ALERT CARD at check-in to  the Emergency Department and triage nurse.  Should you have questions after your visit or need to cancel or reschedule your appointment, please contact [facility]  Dept: [phone]  and follow the prompts.  Office hours are 8:00 a.m. to 4:30 p.m. Monday - Friday. Please note that voicemails left after 4:00 p.m. may not be returned until the following business day.  We are closed weekends and major holidays. You have access to a nurse at all times for urgent questions. Please call the main number to the clinic Dept: [phone] and follow the prompts.   For any non-urgent questions, you may also contact your provider using MyChart. We now offer e-Visits for anyone 18 and older to request care online for non-urgent symptoms. For details visit [email or web address].   Also download the MyChart app! Go to the app store, search "MyChart", open the app, select [facility], and log in with your MyChart username and password.  Masks are optional in the cancer centers. If you would like for your care team to wear a mask while they are taking care of you, please let them know. For doctor visits, patients may have with them one support Sia Gabrielsen who is at least 69 years old. At this time, visitors are not allowed in the infusion area. 

## 2021-07-12 ENCOUNTER — Telehealth: Payer: Self-pay | Admitting: Hematology and Oncology

## 2021-07-12 NOTE — Telephone Encounter (Signed)
Scheduled appointment per 6/21 los. Patient is aware. Changes were made to the patients 7/12 appointment and the patient is aware of the changes made to her upcoming appointment.

## 2021-07-17 DIAGNOSIS — I10 Essential (primary) hypertension: Secondary | ICD-10-CM | POA: Diagnosis not present

## 2021-07-17 DIAGNOSIS — M25671 Stiffness of right ankle, not elsewhere classified: Secondary | ICD-10-CM | POA: Diagnosis not present

## 2021-07-17 DIAGNOSIS — M6259 Muscle wasting and atrophy, not elsewhere classified, multiple sites: Secondary | ICD-10-CM | POA: Diagnosis not present

## 2021-07-17 DIAGNOSIS — Z9181 History of falling: Secondary | ICD-10-CM | POA: Diagnosis not present

## 2021-07-17 DIAGNOSIS — R269 Unspecified abnormalities of gait and mobility: Secondary | ICD-10-CM | POA: Diagnosis not present

## 2021-07-17 DIAGNOSIS — E118 Type 2 diabetes mellitus with unspecified complications: Secondary | ICD-10-CM | POA: Diagnosis not present

## 2021-07-17 DIAGNOSIS — M25572 Pain in left ankle and joints of left foot: Secondary | ICD-10-CM | POA: Diagnosis not present

## 2021-07-17 DIAGNOSIS — F339 Major depressive disorder, recurrent, unspecified: Secondary | ICD-10-CM | POA: Diagnosis not present

## 2021-07-18 ENCOUNTER — Ambulatory Visit (HOSPITAL_BASED_OUTPATIENT_CLINIC_OR_DEPARTMENT_OTHER)
Admission: RE | Admit: 2021-07-18 | Discharge: 2021-07-18 | Disposition: A | Discharge status: Home / Self Care | Payer: Medicare PPO | Source: Ambulatory Visit | Attending: Cardiology | Admitting: Cardiology

## 2021-07-18 ENCOUNTER — Ambulatory Visit (HOSPITAL_COMMUNITY)
Admission: RE | Admit: 2021-07-18 | Discharge: 2021-07-18 | Disposition: A | Discharge status: Home / Self Care | Payer: Medicare PPO | Source: Ambulatory Visit | Attending: Cardiology | Admitting: Cardiology

## 2021-07-18 ENCOUNTER — Encounter (HOSPITAL_COMMUNITY): Payer: Self-pay | Admitting: Cardiology

## 2021-07-18 VITALS — BP 124/60 | HR 51 | Wt 171.0 lb

## 2021-07-18 DIAGNOSIS — E1122 Type 2 diabetes mellitus with diabetic chronic kidney disease: Secondary | ICD-10-CM | POA: Insufficient documentation

## 2021-07-18 DIAGNOSIS — N184 Chronic kidney disease, stage 4 (severe): Secondary | ICD-10-CM

## 2021-07-18 DIAGNOSIS — G4733 Obstructive sleep apnea (adult) (pediatric): Secondary | ICD-10-CM | POA: Insufficient documentation

## 2021-07-18 DIAGNOSIS — Z803 Family history of malignant neoplasm of breast: Secondary | ICD-10-CM | POA: Diagnosis not present

## 2021-07-18 DIAGNOSIS — C50411 Malignant neoplasm of upper-outer quadrant of right female breast: Secondary | ICD-10-CM | POA: Diagnosis not present

## 2021-07-18 DIAGNOSIS — Z9884 Bariatric surgery status: Secondary | ICD-10-CM | POA: Insufficient documentation

## 2021-07-18 DIAGNOSIS — Z923 Personal history of irradiation: Secondary | ICD-10-CM | POA: Insufficient documentation

## 2021-07-18 DIAGNOSIS — N183 Chronic kidney disease, stage 3 unspecified: Secondary | ICD-10-CM | POA: Diagnosis not present

## 2021-07-18 DIAGNOSIS — I2510 Atherosclerotic heart disease of native coronary artery without angina pectoris: Secondary | ICD-10-CM | POA: Diagnosis not present

## 2021-07-18 DIAGNOSIS — I509 Heart failure, unspecified: Secondary | ICD-10-CM | POA: Diagnosis not present

## 2021-07-18 DIAGNOSIS — Z79899 Other long term (current) drug therapy: Secondary | ICD-10-CM | POA: Diagnosis not present

## 2021-07-18 DIAGNOSIS — Z8249 Family history of ischemic heart disease and other diseases of the circulatory system: Secondary | ICD-10-CM | POA: Insufficient documentation

## 2021-07-18 DIAGNOSIS — Z9989 Dependence on other enabling machines and devices: Secondary | ICD-10-CM | POA: Insufficient documentation

## 2021-07-18 DIAGNOSIS — Z9011 Acquired absence of right breast and nipple: Secondary | ICD-10-CM | POA: Diagnosis not present

## 2021-07-18 DIAGNOSIS — K769 Liver disease, unspecified: Secondary | ICD-10-CM | POA: Diagnosis not present

## 2021-07-18 DIAGNOSIS — I5030 Unspecified diastolic (congestive) heart failure: Secondary | ICD-10-CM | POA: Diagnosis not present

## 2021-07-18 DIAGNOSIS — I351 Nonrheumatic aortic (valve) insufficiency: Secondary | ICD-10-CM | POA: Diagnosis not present

## 2021-07-18 DIAGNOSIS — I130 Hypertensive heart and chronic kidney disease with heart failure and stage 1 through stage 4 chronic kidney disease, or unspecified chronic kidney disease: Secondary | ICD-10-CM | POA: Diagnosis not present

## 2021-07-18 DIAGNOSIS — Z7982 Long term (current) use of aspirin: Secondary | ICD-10-CM | POA: Insufficient documentation

## 2021-07-18 DIAGNOSIS — Z9221 Personal history of antineoplastic chemotherapy: Secondary | ICD-10-CM | POA: Insufficient documentation

## 2021-07-18 DIAGNOSIS — Z853 Personal history of malignant neoplasm of breast: Secondary | ICD-10-CM | POA: Insufficient documentation

## 2021-07-18 DIAGNOSIS — Z794 Long term (current) use of insulin: Secondary | ICD-10-CM | POA: Diagnosis not present

## 2021-07-18 LAB — ECHOCARDIOGRAM COMPLETE
Area-P 1/2: 2.36 cm2
Calc EF: 62.6 %
S' Lateral: 3.2 cm
Single Plane A2C EF: 61.7 %
Single Plane A4C EF: 62.4 %

## 2021-07-18 MED ORDER — KERENDIA 10 MG PO TABS: 20.0000 mg | ORAL_TABLET | Freq: Every day | ORAL | 3 refills | Status: DC

## 2021-07-18 NOTE — Patient Instructions (Addendum)
Increase Kerendia to '20mg'$  daily.  Blood work in 10 days.  Your physician has requested that you have an echocardiogram. Echocardiography is a painless test that uses sound waves to create images of your heart. It provides your doctor with information about the size and shape of your heart and how well your heart's chambers and valves are working. This procedure takes approximately one hour. There are no restrictions for this procedure.  Your physician recommends that you schedule a follow-up appointment in: 6 months with an echocardiogram ( December 2023) **please call the office in September to arrange your follow up appointment.**  If you have any questions or concerns before your next appointment please send [name] a message through [place] or call our office at [phone].    TO LEAVE A MESSAGE FOR THE NURSE SELECT OPTION 2, PLEASE LEAVE A MESSAGE INCLUDING: YOUR NAME DATE OF BIRTH CALL BACK NUMBER REASON FOR CALL**this is important as we prioritize the call backs  YOU WILL RECEIVE A CALL BACK THE SAME DAY AS LONG AS YOU CALL BEFORE 4:00 PM  At the [facility], you and your health needs are our priority. As part of our continuing mission to provide you with exceptional heart care, we have created designated Provider Care Teams. These Care Teams include your primary Cardiologist (physician) and Advanced Practice Providers (APPs- Physician Assistants and Nurse Practitioners) who all work together to provide you with the care you need, when you need it.   You may see any of the following providers on your designated Care Team at your next follow up: Dr Glori Bickers Dr Haynes Kerns, NP Lyda Jester, [place] [facility] [place], [place] Audry Riles, PharmD   Please be sure to bring in all your medications bottles to every appointment.

## 2021-07-18 NOTE — Progress Notes (Signed)
  Echocardiogram 2D Echocardiogram has been performed.  Fidel Levy 07/18/2021, 11:52 AM

## 2021-07-18 NOTE — Progress Notes (Signed)
Patient ID: Ayen Viviano, female   DOB: 05-Nov-1950, 71 y.o.   MRN: [id number] Oncologist: Dr Lindi Adie   HPI: Ms Eckrich is a 71 y.o.with history of R breast cancer, right axillary lymph node biopsy 07/19/2014 for a cT2  pN1, stage IIB  invasive ductal carcinoma, grade 2, estrogen and progesterone receptor negative, with an MIB-1 of 90%, and HER-2 positive , OSA -> CPAP, bariatric surgery, and DM, referred to cardio-oncology clinic by Dr Jana Hakim  Completed neoadjuvant chemotherapy with carboplatin, docetaxel, trastuzumab and pertuzumab given every 3 weeks 6. Trastuzumab + capecitabine were completed in 9/17.  She had lumpectomy 12/16 and had mastectomy in 1/17.  She has completed radiation.   She was found in 11/17 to have a recurrence by PET => right retropectoral lymph node enlarged. In 2/18, she started on treatment with ado-trastuzumab emtansine, now back on Herceptin.  Echo prior to treatment showed stable EF.  Starting in 6/19, she was transitioned to [facility].   She is now off Kadcyla and back on Herceptin/Perjeta for use long-term.     Echo in 2/21 showed EF 55-60% with more negative strain.   Coronary artery calcium score was done in 2/21, this showed CAC 3653 Agatston units, placing her in the 99th percentile. She was started on a Crestor but LFTs rose.  She had RUQ [name] without significant abnormality.  Hepatitis labs were negative.  The statin was stopped.  Cardiolite in 3/21 showed EF 51%, no ischemia/infarction.  She ended up on bempedoic acid.   Echo in 6/21 showed EF 55-60% with GLS -17.6%.  Echo today showed EF 55-60%, GLS -15.8%, normal RV.  Echo in 2/22 showed EF 55-60% with GLS -27.5%.  Echo in 9/22 showed EF 55% but strain analysis was not done.   She was found to have liver lesion concerning for metastatic disease.  She has been started on Fam-trastuzumab deruxtecan (Enhertu).  Echo was done today and reviewed, EF EF 55-60%, GLS -17.8%, RV normal, IVC normal.     She  comes in today for followup of CAD and chemotherapy with Enhertu.  No significant exertional dyspnea.  No chest pain.  Main complaint is tingling/pain in feet from peripheral neuropathy.   Labs (1/21): LDL 75 Labs (4/21): LDL 42, AST 70 => 125, ALT 117 => 255 Labs (10/21): LDL 18, HDL 58,  K 3.8, creatinine 1.88 Labs (1/22): K 4.1, creatinine 1.85 Labs (9/22): K 4.4, creatinine 2.38, LDL 26 Labs (6/23): K 4.7, creatinine 2.02  Echo (7/16) with EF 60-65%, grade II diastolic dysfunction.  Echo (10/16) with EF 66%, GLS -17%. Echo (1/17) with EF 60-65%, grade II diastolic dysfunction, lateral s' 10, GLS -18.8% Echo (4/17) with EF 60-65%, grade II diastolic dysfunction, lateral s' 9.5, strain poorly traced and off axis, normal RV size and systolic function.  Echo (6/17) with EF 55-60%, aortic sclerosis without stenosis, difficult images for strain, probably not accurate.  Echo (10/17) with EF 55-60%, mild LVH, moderate diastolic dysfunction, normal RV size and systolic function.  Echo (2/18) with EF 55-60%, GLS -15% but technically difficult study Echo (6/18) with EF 60-65%, GLS -15% but technically difficult study.  Echo (9/18) with EF 55-60%, GLS -19.8%, grade II diastolic dysfunction.  Echo (1/19) with EF 55-60%, normal RV size and systolic function, GLS -16.9% (done with different machine).  Echo (4/19) with EF 55-60%, mild LVH, GLS -17% Echo (9/19) with EF 55-60%, GLS -17.1%, normal RV size and systolic function.  Echo (8/20): EF 50-55%.  Echo (11/20):  EF 50-55%, GLS -12.7% (not ideal tracing), mildly decreased RV systolic function.   Echo (2/21): EF 55-60%, GLS -17.9%, grade II diastolic dysfunction, normal RV Echo (6/21): EF 55-60% with GLS -17.6% Echo (10/21): EF 55-60%, normal RV, GLS -15.8%.  Echo (2/22): EF 55-60% with GLS -27.5%. Echo (9/22): EF 55%, strain analysis not done.  Echo (12/22): EF 55-60%, normal RV, strain images suboptimal. Echo (6/23): EF 55-60%, GLS -17.8%, RV  normal, IVC normal  Review of Systems: All systems reviewed and negative except as per HPI.   Past Medical History:  Diagnosis Date   Anemia    hx of - during 1st round chemo   Anxiety    Arthritis    in fingers, shoulders   Back pain    Balance problem    Breast cancer ([place])    Breast cancer of upper-outer quadrant of right female breast ([place]) 07/22/2014   Bunion, right foot    CHF (congestive heart failure) (HCC)    Chronic kidney disease    creatininei levels high per pt    Clumsiness    Constipation    Depression    Diabetes mellitus without complication ([place])    87+ years type 2    Diabetic retinopathy ([place])    torn retina   Eating disorder    binge eating   Epistaxis 08/26/2014   Fatty liver    Foot drop, left foot    HCAP (healthcare-associated pneumonia) 12/18/2014   Heart murmur    never had any problems   History of radiation therapy 04/05/15-05/19/15   right chest wall was treated to 50.4 Gy in 28 fractions, right mastectomy scar was treated to 10 Gy in 5 fractions   Hyperlipidemia    Hypertension    Joint pain    Multiple food allergies    Neuromuscular disorder ([place])    diabetic neuropathy   Obesity    Obstructive sleep apnea on CPAP    does not use cpap all the time has lost 160 lbs    Ovarian cyst rupture    possible   Pneumonia    Pneumonia 11/2014   Radiation 04/05/15-05/19/15   right chest wall 50.4 Gy, mastectomy scar 10 Gy   Sleep apnea    Thyroid nodule 06/2014    Current Outpatient Medications  Medication Sig Dispense Refill   Accu-Chek Softclix Lancets lancets 1 each by Other route 4 (four) times daily. Test blood sugars 4 x times daily 200 each 1   aspirin EC 81 MG tablet Take 1 tablet (81 mg total) by mouth daily. 90 tablet 3   Blood Glucose Calibration (ACCU-CHEK GUIDE CONTROL) LIQD 1 Bottle by In Vitro route as needed. 1 each 0   Blood Glucose Monitoring Suppl (ACCU-CHEK GUIDE) w/Device KIT 1 each by Does not apply route 4 (four) times  daily. Use to test blood sugars 4x a day 1 kit 0   buPROPion (WELLBUTRIN SR) 200 MG 12 hr tablet Take one tablet by mouth once daily. 90 tablet 0   CALCIUM CITRATE-VITAMIN D PO Take 1 tablet by mouth 3 (three) times daily.      carvedilol (COREG) 6.25 MG tablet Take 1 tablet (6.25 mg total) by mouth 2 (two) times daily. 180 tablet 3   cholecalciferol (VITAMIN D) 1000 units tablet Take 1,000 Units by mouth daily.     Continuous Blood Gluc Sensor (FREESTYLE LIBRE 2 SENSOR) MISC      Cyanocobalamin (VITAMIN B-12 PO) Take 1 tablet by mouth every 14 (  fourteen) days.     diphenhydrAMINE (BENADRYL) 25 MG tablet Take 25 mg by mouth daily as needed for allergies or sleep.     glucose blood (ACCU-CHEK GUIDE) test strip Use Accu Chek test strips to check blood sugar 4 times daily. DX:E11.21 400 each 0   insulin aspart (NOVOLOG) 100 UNIT/ML injection Inject into the skin daily as needed for high blood sugar. Per patient, sliding scale with meals as needed     insulin glargine (LANTUS SOLOSTAR) 100 UNIT/ML Solostar Pen Inject 8 Units into the skin daily. 7 mL 3   lidocaine-prilocaine (EMLA) cream Apply to affected area once 30 g 3   loratadine (CLARITIN) 10 MG tablet Take 10 mg by mouth as needed for allergies.     LORazepam (ATIVAN) 0.5 MG tablet Take 1 tablet (0.5 mg total) by mouth every 6 (six) hours as needed (Nausea or vomiting). 30 tablet 0   melatonin 5 MG TABS Take 5 mg by mouth at bedtime.     NEXLIZET 180-10 MG TABS TAKE 1 TABLET BY MOUTH DAILY. 30 tablet 11   omeprazole (PRILOSEC) 40 MG capsule Take 1 capsule (40 mg total) by mouth daily. 30 capsule 1   ondansetron (ZOFRAN) 8 MG tablet Take 1 tablet (8 mg total) by mouth every 8 (eight) hours as needed for nausea or vomiting. Start on day 3 after chemo. 30 tablet 2   Pediatric Multivitamins-Iron (FLINTSTONES PLUS IRON) chewable tablet Chew 1 tablet by mouth 2 (two) times daily.     polyethylene glycol (MIRALAX / GLYCOLAX) 17 g packet Take 17 g by  mouth daily.     Probiotic Product ([employer] PROBIOTIC PO) Take 1 capsule by mouth daily.      prochlorperazine (COMPAZINE) 10 MG tablet Take 1 tablet (10 mg total) by mouth every 6 (six) hours as needed (Nausea or vomiting). 30 tablet 1   Propylene Glycol (SYSTANE BALANCE OP) Place 1-2 drops into both eyes 3 (three) times daily as needed (for dry eyes).      SURE COMFORT PEN NEEDLES 32G X 4 MM MISC Use as instructed to inject insulin and Victoza daily. 200 each 0   tobramycin (TOBREX) 0.3 % ophthalmic ointment Place 1 application into both eyes as needed (Takes day before eye injections).     vortioxetine HBr (TRINTELLIX) 20 MG TABS tablet Take 1 tablet (20 mg total) by mouth at bedtime. 90 tablet 0   Finerenone (KERENDIA) 10 MG TABS Take 20 mg by mouth daily. 180 tablet 3   No current facility-administered medications for this encounter.    Allergies  Allergen Reactions   Nsaids Other (See Comments)    H/o gastric bypass - avoid NSAIDs   Tape Hives, Rash and Other (See Comments)    Adhesives in bandaids      Social History   Socioeconomic History   Marital status: Married    Spouse name: [name]   Number of children: 0   Years of education: Bachelor's   Highest education level: Bachelor's degree (e.g., BA, AB, BS)  Occupational History   Occupation: Pensions consultant: [employer]  Tobacco Use   Smoking status: Never   Smokeless tobacco: Never  Vaping Use   Vaping Use: Never used  Substance and Sexual Activity   Alcohol use: Yes    Alcohol/week: 1.0 - 2.0 standard drink of alcohol    Types: 1 - 2 Standard drinks or equivalent per week    Comment: social   Drug  use: No   Sexual activity: Yes    Partners: Male    Birth control/protection: Post-menopausal, Other-see comments    Comment: husband vasectomy  Other Topics Concern   Not on file  Social History Narrative   Patient is married [name]) and lives at home with her husband.   Patient does not  have any children.   Patient is working for a [employer].   Patient has a Bachelor's degree.   Patient is right-handed.   Patient does not drink any caffeine.   Social Determinants of Health   Financial Resource Strain: Low Risk  (05/19/2021)   Overall Financial Resource Strain (CARDIA)    Difficulty of Paying Living Expenses: Not hard at all  Food Insecurity: No Food Insecurity (05/19/2021)   Hunger Vital Sign    Worried About Running Out of Food in the Last Year: Never true    Ran Out of Food in the Last Year: Never true  Transportation Needs: No Transportation Needs (05/19/2021)   PRAPARE - Hydrologist (Medical): No    Lack of Transportation (Non-Medical): No  Physical Activity: Unknown (05/19/2021)   Exercise Vital Sign    Days of Exercise per Week: 1 day    Minutes of Exercise per Session: Not on file  Stress: No Stress Concern Present (05/19/2021)   [place]    Feeling of Stress : Not at all  Social Connections: [place] (05/19/2021)   Social Connection and Isolation Panel [NHANES]    Frequency of Communication with Friends and Family: Three times a week    Frequency of Social Gatherings with Friends and Family: Twice a week    Attends Religious Services: 1 to 4 times per year    Active Member of [employer] or Organizations: Yes    Attends Music therapist: More than 4 times per year    Marital Status: Married  Human resources officer Violence: Not on file      Family History  Problem Relation Age of Onset   CVA Mother    Heart disease Mother    Sudden death Mother    Diabetes Father    Heart failure Father    Hypertension Father    Kidney disease Father    Obesity Father    Hypertension Sister    Diabetes Brother    Breast cancer Paternal Grandmother 84   Diabetes Paternal Uncle    Ovarian cancer Neg Hx     Vitals:   07/18/21 1204  BP: 124/60   Pulse: (!) 51  SpO2: 99%  Weight: 77.6 kg (171 lb)    PHYSICAL EXAM: General: NAD Neck: No JVD, no thyromegaly or thyroid nodule.  Lungs: Clear to auscultation bilaterally with normal respiratory effort. CV: Nondisplaced PMI.  Heart regular S1/S2, no S3/S4, no murmur.  No peripheral edema.  No carotid bruit.  Normal pedal pulses.  Abdomen: Soft, nontender, no hepatosplenomegaly, no distention.  Skin: Intact without lesions or rashes.  Neurologic: Alert and oriented x 3.  Psych: Normal affect. Extremities: No clubbing or cyanosis.  HEENT: Normal.   ASSESSMENT & PLAN:  1. Breast cancer: Metastatic.  Currently on Fam-trastuzumab deruxtecan (Enhertu).  Echo today shows stable EF 55-60%, GLS mildly low at -17.8% but comparable to prior studies.    - Continue Coreg 6.25 mg bid.  - Given stability in LV function and probable lower cardiac risk with Enhertu compared to Herceptin, will repeat echo again in  6 months.  2. HTN: BP controlled.  3. OSA: Continue nightly CPAP   4. CAD: Coronary artery calcium score was elevated, 3653 Agatston units, placing her in the 99th percentile for her age and gender.  This suggests high risk for future cardiac events.  She has no chest pain.  Cardiolite 3/21 with no ischemia/infarction.  Rise in LFTs with Crestor. Unable to obtain Repatha for her.  - She is now on Nexlizet, good lipids in 9/22.   - Continue ASA 81, beta blocker.  5. CKD stage 3: Suspect diabetic nephropathy.  Last creatinine 2.02.   - Off dapagliflozin with orthostasis/hypotension, resolved.  - Tolerating finerenone, will increase to 20 mg daily. BMET 10 days.    Followup in 6 months with echo  Loralie Champagne 07/18/2021

## 2021-07-22 ENCOUNTER — Other Ambulatory Visit: Payer: Medicare PPO

## 2021-07-26 DIAGNOSIS — M25671 Stiffness of right ankle, not elsewhere classified: Secondary | ICD-10-CM | POA: Diagnosis not present

## 2021-07-26 DIAGNOSIS — R269 Unspecified abnormalities of gait and mobility: Secondary | ICD-10-CM | POA: Diagnosis not present

## 2021-07-26 DIAGNOSIS — M25572 Pain in left ankle and joints of left foot: Secondary | ICD-10-CM | POA: Diagnosis not present

## 2021-07-26 DIAGNOSIS — F339 Major depressive disorder, recurrent, unspecified: Secondary | ICD-10-CM | POA: Diagnosis not present

## 2021-07-26 DIAGNOSIS — Z9181 History of falling: Secondary | ICD-10-CM | POA: Diagnosis not present

## 2021-07-26 DIAGNOSIS — M6259 Muscle wasting and atrophy, not elsewhere classified, multiple sites: Secondary | ICD-10-CM | POA: Diagnosis not present

## 2021-07-26 DIAGNOSIS — I10 Essential (primary) hypertension: Secondary | ICD-10-CM | POA: Diagnosis not present

## 2021-07-26 DIAGNOSIS — E118 Type 2 diabetes mellitus with unspecified complications: Secondary | ICD-10-CM | POA: Diagnosis not present

## 2021-07-27 DIAGNOSIS — H31093 Other chorioretinal scars, bilateral: Secondary | ICD-10-CM | POA: Diagnosis not present

## 2021-07-27 DIAGNOSIS — H3582 Retinal ischemia: Secondary | ICD-10-CM | POA: Diagnosis not present

## 2021-07-27 DIAGNOSIS — E113513 Type 2 diabetes mellitus with proliferative diabetic retinopathy with macular edema, bilateral: Secondary | ICD-10-CM | POA: Diagnosis not present

## 2021-07-30 ENCOUNTER — Ambulatory Visit (HOSPITAL_COMMUNITY)
Admission: RE | Admit: 2021-07-30 | Discharge: 2021-07-30 | Disposition: A | Discharge status: Home / Self Care | Payer: Medicare PPO | Source: Ambulatory Visit | Attending: Internal Medicine | Admitting: Internal Medicine

## 2021-07-30 DIAGNOSIS — C50411 Malignant neoplasm of upper-outer quadrant of right female breast: Secondary | ICD-10-CM | POA: Diagnosis not present

## 2021-07-30 LAB — BASIC METABOLIC PANEL
Anion gap: 8 (ref 5–15)
BUN: 40 mg/dL — ABNORMAL HIGH (ref 8–23)
CO2: 26 mmol/L (ref 22–32)
Calcium: 8.7 mg/dL — ABNORMAL LOW (ref 8.9–10.3)
Chloride: 106 mmol/L (ref 98–111)
Creatinine, Ser: 1.95 mg/dL — ABNORMAL HIGH (ref 0.44–1.00)
GFR, Estimated: 28 mL/min — ABNORMAL LOW (ref >60)
Glucose, Bld: 84 mg/dL (ref 70–99)
Potassium: 4.7 mmol/L (ref 3.5–5.1)
Sodium: 140 mmol/L (ref 135–145)

## 2021-07-31 ENCOUNTER — Inpatient Hospital Stay: Payer: Medicare PPO | Attending: Oncology

## 2021-07-31 ENCOUNTER — Ambulatory Visit (INDEPENDENT_AMBULATORY_CARE_PROVIDER_SITE_OTHER): Payer: Medicare PPO | Admitting: Family Medicine

## 2021-07-31 ENCOUNTER — Encounter (INDEPENDENT_AMBULATORY_CARE_PROVIDER_SITE_OTHER): Payer: Self-pay | Admitting: Family Medicine

## 2021-07-31 ENCOUNTER — Other Ambulatory Visit: Payer: Self-pay

## 2021-07-31 ENCOUNTER — Inpatient Hospital Stay (HOSPITAL_BASED_OUTPATIENT_CLINIC_OR_DEPARTMENT_OTHER): Payer: Medicare PPO | Admitting: Hematology and Oncology

## 2021-07-31 VITALS — BP 120/53 | HR 55 | Temp 98.1°F | Ht 69.0 in | Wt 170.0 lb

## 2021-07-31 DIAGNOSIS — F3289 Other specified depressive episodes: Secondary | ICD-10-CM

## 2021-07-31 DIAGNOSIS — M25572 Pain in left ankle and joints of left foot: Secondary | ICD-10-CM | POA: Diagnosis not present

## 2021-07-31 DIAGNOSIS — C787 Secondary malignant neoplasm of liver and intrahepatic bile duct: Secondary | ICD-10-CM | POA: Diagnosis not present

## 2021-07-31 DIAGNOSIS — F339 Major depressive disorder, recurrent, unspecified: Secondary | ICD-10-CM | POA: Diagnosis not present

## 2021-07-31 DIAGNOSIS — E669 Obesity, unspecified: Secondary | ICD-10-CM | POA: Diagnosis not present

## 2021-07-31 DIAGNOSIS — D6481 Anemia due to antineoplastic chemotherapy: Secondary | ICD-10-CM | POA: Diagnosis not present

## 2021-07-31 DIAGNOSIS — Z171 Estrogen receptor negative status [ER-]: Secondary | ICD-10-CM | POA: Diagnosis not present

## 2021-07-31 DIAGNOSIS — I10 Essential (primary) hypertension: Secondary | ICD-10-CM | POA: Diagnosis not present

## 2021-07-31 DIAGNOSIS — M6259 Muscle wasting and atrophy, not elsewhere classified, multiple sites: Secondary | ICD-10-CM | POA: Diagnosis not present

## 2021-07-31 DIAGNOSIS — F439 Reaction to severe stress, unspecified: Secondary | ICD-10-CM | POA: Diagnosis not present

## 2021-07-31 DIAGNOSIS — C50411 Malignant neoplasm of upper-outer quadrant of right female breast: Secondary | ICD-10-CM

## 2021-07-31 DIAGNOSIS — R269 Unspecified abnormalities of gait and mobility: Secondary | ICD-10-CM | POA: Diagnosis not present

## 2021-07-31 DIAGNOSIS — Z5112 Encounter for antineoplastic immunotherapy: Secondary | ICD-10-CM | POA: Diagnosis present

## 2021-07-31 DIAGNOSIS — Z6825 Body mass index (BMI) 25.0-25.9, adult: Secondary | ICD-10-CM

## 2021-07-31 DIAGNOSIS — M25671 Stiffness of right ankle, not elsewhere classified: Secondary | ICD-10-CM | POA: Diagnosis not present

## 2021-07-31 DIAGNOSIS — E118 Type 2 diabetes mellitus with unspecified complications: Secondary | ICD-10-CM | POA: Diagnosis not present

## 2021-07-31 DIAGNOSIS — D72819 Decreased white blood cell count, unspecified: Secondary | ICD-10-CM | POA: Diagnosis not present

## 2021-07-31 DIAGNOSIS — C50811 Malignant neoplasm of overlapping sites of right female breast: Secondary | ICD-10-CM | POA: Diagnosis present

## 2021-07-31 DIAGNOSIS — Z9181 History of falling: Secondary | ICD-10-CM | POA: Diagnosis not present

## 2021-07-31 LAB — CMP (CANCER CENTER ONLY)
ALT: 14 U/L (ref 0–44)
AST: 28 U/L (ref 15–41)
Albumin: 3.8 g/dL (ref 3.5–5.0)
Alkaline Phosphatase: 51 U/L (ref 38–126)
Anion gap: 4 — ABNORMAL LOW (ref 5–15)
BUN: 47 mg/dL — ABNORMAL HIGH (ref 8–23)
CO2: 29 mmol/L (ref 22–32)
Calcium: 8.8 mg/dL — ABNORMAL LOW (ref 8.9–10.3)
Chloride: 107 mmol/L (ref 98–111)
Creatinine: 2.15 mg/dL — ABNORMAL HIGH (ref 0.44–1.00)
GFR, Estimated: 24 mL/min — ABNORMAL LOW (ref >60)
Glucose, Bld: 114 mg/dL — ABNORMAL HIGH (ref 70–99)
Potassium: 4.8 mmol/L (ref 3.5–5.1)
Sodium: 140 mmol/L (ref 135–145)
Total Bilirubin: 0.4 mg/dL (ref 0.3–1.2)
Total Protein: 6.1 g/dL — ABNORMAL LOW (ref 6.5–8.1)

## 2021-07-31 LAB — CBC WITH DIFFERENTIAL (CANCER CENTER ONLY)
Abs Immature Granulocytes: 0.02 10*3/uL (ref 0.00–0.07)
Basophils Absolute: 0 10*3/uL (ref 0.0–0.1)
Basophils Relative: 0 %
Eosinophils Absolute: 0.1 10*3/uL (ref 0.0–0.5)
Eosinophils Relative: 3 %
HCT: 29.1 % — ABNORMAL LOW (ref 36.0–46.0)
Hemoglobin: 10 g/dL — ABNORMAL LOW (ref 12.0–15.0)
Immature Granulocytes: 0 %
Lymphocytes Relative: 25 %
Lymphs Abs: 1.2 10*3/uL (ref 0.7–4.0)
MCH: 32.9 pg (ref 26.0–34.0)
MCHC: 34.4 g/dL (ref 30.0–36.0)
MCV: 95.7 fL (ref 80.0–100.0)
Monocytes Absolute: 0.5 10*3/uL (ref 0.1–1.0)
Monocytes Relative: 11 %
Neutro Abs: 2.8 10*3/uL (ref 1.7–7.7)
Neutrophils Relative %: 61 %
Platelet Count: 190 10*3/uL (ref 150–400)
RBC: 3.04 MIL/uL — ABNORMAL LOW (ref 3.87–5.11)
RDW: 15 % (ref 11.5–15.5)
WBC Count: 4.7 10*3/uL (ref 4.0–10.5)
nRBC: 0 % (ref 0.0–0.2)

## 2021-07-31 LAB — CEA (IN HOUSE-CHCC): CEA (CHCC-In House): 468.44 ng/mL — ABNORMAL HIGH (ref 0.00–5.00)

## 2021-07-31 MED ORDER — VORTIOXETINE HBR 20 MG PO TABS: 20.0000 mg | ORAL_TABLET | Freq: Every day | ORAL | 0 refills | Status: DC

## 2021-07-31 MED ORDER — BUPROPION HCL ER (SR) 200 MG PO TB12: ORAL_TABLET | ORAL | 0 refills | Status: DC

## 2021-07-31 MED FILL — Fosaprepitant Dimeglumine For IV Infusion 150 MG (Base Eq): INTRAVENOUS | Qty: 5 | Status: AC

## 2021-07-31 NOTE — Progress Notes (Signed)
Patient Care Team: Isaac Bliss, Rayford Halsted, MD as PCP - General (Internal Medicine) Larey Dresser, MD as PCP - Advanced Heart Failure (Cardiology) Stark Klein, MD as Consulting Physician (General Surgery) Jacelyn Pi, MD as Consulting Physician (Endocrinology) [name], MD as Consulting Physician (Plastic Surgery) Gery Pray, MD as Consulting Physician (Radiation Oncology) Juanita Craver, MD as Consulting Physician (Gastroenterology) Alda Berthold, DO as Consulting Physician (Neurology) Starlyn Skeans, MD as Consulting Physician (Family Medicine) Sherlynn Stalls, MD as Consulting Physician (Ophthalmology) Salvadore Dom, MD as Consulting Physician (Obstetrics and Gynecology) Raina Mina, RPH-CPP (Pharmacist) Nicholas Lose, MD as Consulting Physician (Hematology and Oncology)  DIAGNOSIS:  Encounter Diagnosis  Name Primary?   Malignant neoplasm of upper-outer quadrant of right breast in female, estrogen receptor negative ([place])     SUMMARY OF ONCOLOGIC HISTORY: Oncology History  Breast cancer of upper-outer quadrant of right female breast ([place])  07/19/2014 Initial Biopsy   (R) breast needle biopsy (10:00): IDC, DCIS, grade 2. ER-, PR-, HER2+ (ratio 2.42). Ki67 90%.    07/22/2014 Initial Diagnosis   Breast cancer of upper-outer quadrant of right female breast   08/08/2014 - 11/21/2014 Neo-Adjuvant Chemotherapy   Taxotere/Carbo/Herceptin/Perjeta x 6 cycles.    12/12/2014 - 10/02/2015 Chemotherapy   Maintenance Herceptin (to complete 1 year of therapy).    12/27/2014 Surgery   (R) lumpectomy with SLNB [facility]): IDC, grade 2, spanning 4 cm, associated high grade DCIS. Negative margins. 1/3 SLN (+).   ER/PR repeated and remain negative.   ypT2, ypN1a: Stage IIB   01/06/2015 Surgery   Bilateral breast mammoplasty ([place]): Right breast path with intralymphatic emboli of ductal carcinoma. Left breast benign.    02/02/2015 Imaging   CT chest: No  findings of metastatic disease in the chest. Right axillary fluid density lesion likely postoperative seroma or hematoma. Small left subpectoral nodes warrant followup attention.   02/02/2015 Imaging   Bone scan: No scintigraphic evidence of osseous metastatic disease   02/16/2015 Surgery   (R) mastectomy and ALND [facility]): Scattered foci of high grade DCIS, scattered intralymphatic tumor emboli. Margins negative. 0/15 LNs.    04/05/2015 - 05/19/2015 Radiation Therapy   Adjuvant XRT ([place]). The right chest wall was treated to 50.4 Gy in 28 fractions at 1.8 Gy per fraction.  The right mastectomy scar was treated to 10 Gy in 5 fractions at 2 Gy per fraction.    12/06/2015 PET scan   Right subpectoral lymph node measuring 9 mm and exhibits malignant range FDG uptake, suspicious for metastatic adenopathy. Subcutaneous nodule within the upper left chest wall exhibits malignant range uptake, which is indeterminate; this is at the site of previous Port-A-Cath and is favored to represent postsurgical change. Indeterminate left adrenal nodules which exhibit mild uptake, favored to represent benign adenoma.   08/11/2017 - 11/03/2017 Chemotherapy   The patient had ado-trastuzumab emtansine (KADCYLA) 320 mg in sodium chloride 0.9 % 250 mL chemo infusion, 3.6 mg/kg = 320 mg, Intravenous, Once, 4 of 5 cycles Administration: 320 mg (08/12/2017), 320 mg (09/01/2017), 320 mg (09/23/2017)  for chemotherapy treatment.    11/18/2017 - 08/30/2020 Chemotherapy   The patient had trastuzumab (HERCEPTIN) 546 mg in sodium chloride 0.9 % 250 mL chemo infusion, 6 mg/kg = 546 mg (100 % of original dose 6 mg/kg), Intravenous,  Once, 2 of 2 cycles Dose modification: 6 mg/kg (original dose 6 mg/kg, Cycle 1, Reason: Provider Judgment) Administration: 546 mg (11/18/2017), 546 mg (12/16/2017) pertuzumab (PERJETA) 420 mg in sodium chloride  0.9 % 250 mL chemo infusion, 420 mg (100 % of original dose 420 mg), Intravenous, Once, 15 of 15  cycles Dose modification: 420 mg (original dose 420 mg, Cycle 18), 420 mg (original dose 420 mg, Cycle 27) Administration: 420 mg (10/06/2018), 420 mg (10/27/2018), 420 mg (11/18/2018), 420 mg (12/08/2018), 420 mg (12/29/2018), 420 mg (01/19/2019), 420 mg (02/16/2019), 420 mg (03/16/2019), 420 mg (04/13/2019), 420 mg (06/08/2019), 420 mg (07/06/2019), 420 mg (08/03/2019), 420 mg (08/31/2019), 420 mg (09/28/2019), 420 mg (05/19/2019) trastuzumab-anns (KANJINTI) 546 mg in sodium chloride 0.9 % 250 mL chemo infusion, 6 mg/kg = 546 mg (100 % of original dose 6 mg/kg), Intravenous,  Once, 30 of 30 cycles Dose modification: 6 mg/kg (original dose 6 mg/kg, Cycle 3, Reason: Other (see comments), Comment: requested step therapy by [facility] insurance), 4 mg/kg (original dose 6 mg/kg, Cycle 9, Reason: Provider Judgment), 6 mg/kg (original dose 6 mg/kg, Cycle 17, Reason: Provider Judgment), 4 mg/kg (original dose 6 mg/kg, Cycle 27, Reason: Provider Judgment), 6 mg/kg (original dose 6 mg/kg, Cycle 27, Reason: Provider Judgment) Administration: 546 mg (01/13/2018), 546 mg (02/10/2018), 546 mg (03/03/2018), 546 mg (03/24/2018), 546 mg (04/14/2018), 546 mg (05/05/2018), 357 mg (05/26/2018), 357 mg (06/09/2018), 357 mg (06/23/2018), 357 mg (07/07/2018), 357 mg (07/21/2018), 357 mg (08/04/2018), 357 mg (08/18/2018), 357 mg (09/01/2018), 546 mg (09/15/2018), 546 mg (10/06/2018), 546 mg (10/27/2018), 546 mg (11/18/2018), 546 mg (12/08/2018), 546 mg (12/29/2018), 546 mg (01/19/2019), 546 mg (02/16/2019), 480 mg (03/16/2019), 483 mg (04/13/2019), 450 mg (06/08/2019), 450 mg (07/06/2019), 450 mg (08/03/2019), 450 mg (08/31/2019), 450 mg (09/28/2019)  for chemotherapy treatment.    12/31/2017 - 09/01/2018 Chemotherapy   The patient had PACLitaxel-protein bound (ABRAXANE) chemo infusion 175 mg, 80 mg/m2 = 175 mg (100 % of original dose 80 mg/m2), Intravenous,  Once, 5 of 10 cycles Dose modification: 80 mg/m2 (original dose 80 mg/m2, Cycle 11) Administration: 175 mg  (07/07/2018), 175 mg (07/21/2018), 175 mg (08/04/2018), 175 mg (08/18/2018), 175 mg (09/01/2018) PACLitaxel-protein bound (ABRAXANE) chemo infusion 200 mg, 100 mg/m2 = 200 mg, Intravenous, Once, 10 of 10 cycles Dose modification: 80 mg/m2 (original dose 100 mg/m2, Cycle 2, Reason: Provider Judgment), 80 mg/m2 (original dose 100 mg/m2, Cycle 2, Reason: Provider Judgment) Administration: 200 mg (12/31/2017), 200 mg (01/06/2018), 175 mg (01/20/2018), 175 mg (01/27/2018), 175 mg (02/10/2018), 175 mg (03/03/2018), 175 mg (03/10/2018), 175 mg (03/24/2018), 175 mg (04/07/2018), 175 mg (04/14/2018), 175 mg (04/28/2018), 175 mg (05/05/2018), 175 mg (05/26/2018), 175 mg (06/09/2018), 175 mg (05/19/2018)  for chemotherapy treatment.    10/05/2020 - 01/18/2021 Chemotherapy   Patient is on Treatment Plan : BREAST METASTATIC fam-trastuzumab deruxtecan-nxki (Enhertu) q21d     02/19/2021 - 05/14/2021 Chemotherapy   Patient is on Treatment Plan : BREAST Trastuzumab q21d x 13 cycles     06/20/2021 -  Chemotherapy   Patient is on Treatment Plan : BREAST METASTATIC fam-trastuzumab deruxtecan-nxki (Enhertu) q21d     Metastasis to liver ([place])  08/04/2017 Initial Diagnosis   Metastasis to liver ([place])   08/12/2017 - 10/14/2017 Chemotherapy   The patient had ado-trastuzumab emtansine (KADCYLA) 320 mg in sodium chloride 0.9 % 250 mL chemo infusion, 3.6 mg/kg = 320 mg, Intravenous, Once, 4 of 5 cycles Administration: 320 mg (08/12/2017), 320 mg (09/01/2017), 320 mg (09/23/2017)  for chemotherapy treatment.    11/18/2017 - 08/30/2020 Chemotherapy   The patient had trastuzumab (HERCEPTIN) 546 mg in sodium chloride 0.9 % 250 mL chemo infusion, 6 mg/kg = 546 mg (100 % of original dose  6 mg/kg), Intravenous,  Once, 2 of 2 cycles Dose modification: 6 mg/kg (original dose 6 mg/kg, Cycle 1, Reason: Provider Judgment) Administration: 546 mg (11/18/2017), 546 mg (12/16/2017) pertuzumab (PERJETA) 420 mg in sodium chloride 0.9 % 250 mL chemo infusion, 420 mg  (100 % of original dose 420 mg), Intravenous, Once, 15 of 15 cycles Dose modification: 420 mg (original dose 420 mg, Cycle 18), 420 mg (original dose 420 mg, Cycle 27) Administration: 420 mg (10/06/2018), 420 mg (10/27/2018), 420 mg (11/18/2018), 420 mg (12/08/2018), 420 mg (12/29/2018), 420 mg (01/19/2019), 420 mg (02/16/2019), 420 mg (03/16/2019), 420 mg (04/13/2019), 420 mg (06/08/2019), 420 mg (07/06/2019), 420 mg (08/03/2019), 420 mg (08/31/2019), 420 mg (09/28/2019), 420 mg (05/19/2019) trastuzumab-anns (KANJINTI) 546 mg in sodium chloride 0.9 % 250 mL chemo infusion, 6 mg/kg = 546 mg (100 % of original dose 6 mg/kg), Intravenous,  Once, 30 of 30 cycles Dose modification: 6 mg/kg (original dose 6 mg/kg, Cycle 3, Reason: Other (see comments), Comment: requested step therapy by [facility] insurance), 4 mg/kg (original dose 6 mg/kg, Cycle 9, Reason: Provider Judgment), 6 mg/kg (original dose 6 mg/kg, Cycle 17, Reason: Provider Judgment), 4 mg/kg (original dose 6 mg/kg, Cycle 27, Reason: Provider Judgment), 6 mg/kg (original dose 6 mg/kg, Cycle 27, Reason: Provider Judgment) Administration: 546 mg (01/13/2018), 546 mg (02/10/2018), 546 mg (03/03/2018), 546 mg (03/24/2018), 546 mg (04/14/2018), 546 mg (05/05/2018), 357 mg (05/26/2018), 357 mg (06/09/2018), 357 mg (06/23/2018), 357 mg (07/07/2018), 357 mg (07/21/2018), 357 mg (08/04/2018), 357 mg (08/18/2018), 357 mg (09/01/2018), 546 mg (09/15/2018), 546 mg (10/06/2018), 546 mg (10/27/2018), 546 mg (11/18/2018), 546 mg (12/08/2018), 546 mg (12/29/2018), 546 mg (01/19/2019), 546 mg (02/16/2019), 480 mg (03/16/2019), 483 mg (04/13/2019), 450 mg (06/08/2019), 450 mg (07/06/2019), 450 mg (08/03/2019), 450 mg (08/31/2019), 450 mg (09/28/2019)  for chemotherapy treatment.      CHIEF COMPLIANT: Cycle 3 Enhertu  INTERVAL HISTORY: Darlene [name] is a 71 y.o. with above-mentioned history of HER-2 positive, estrogen receptor negative breast cancer, was on chemotherapy with Enhertu. She presents to the  clinic today for follow-up. She states that last treatment went very well. Denies nausea constipation and fatigue. Denies diarrhea.  She has had no side effects from Enhertu treatment and is also concerned that the CEA has been rising steadily and significantly.   ALLERGIES:  is allergic to nsaids and tape.  MEDICATIONS:  Current Outpatient Medications  Medication Sig Dispense Refill   Accu-Chek Softclix Lancets lancets 1 each by Other route 4 (four) times daily. Test blood sugars 4 x times daily 200 each 1   aspirin EC 81 MG tablet Take 1 tablet (81 mg total) by mouth daily. 90 tablet 3   Blood Glucose Calibration (ACCU-CHEK GUIDE CONTROL) LIQD 1 Bottle by In Vitro route as needed. 1 each 0   Blood Glucose Monitoring Suppl (ACCU-CHEK GUIDE) w/Device KIT 1 each by Does not apply route 4 (four) times daily. Use to test blood sugars 4x a day 1 kit 0   buPROPion (WELLBUTRIN SR) 200 MG 12 hr tablet Take one tablet by mouth once daily. 90 tablet 0   CALCIUM CITRATE-VITAMIN D PO Take 1 tablet by mouth 3 (three) times daily.      carvedilol (COREG) 6.25 MG tablet Take 1 tablet (6.25 mg total) by mouth 2 (two) times daily. 180 tablet 3   cholecalciferol (VITAMIN D) 1000 units tablet Take 1,000 Units by mouth daily.     Continuous Blood Gluc Sensor (FREESTYLE LIBRE 2 SENSOR) MISC  Cyanocobalamin (VITAMIN B-12 PO) Take 1 tablet by mouth every 14 (fourteen) days.     diphenhydrAMINE (BENADRYL) 25 MG tablet Take 25 mg by mouth daily as needed for allergies or sleep.     Finerenone (KERENDIA) 10 MG TABS Take 20 mg by mouth daily. 180 tablet 3   glucose blood (ACCU-CHEK GUIDE) test strip Use Accu Chek test strips to check blood sugar 4 times daily. DX:E11.21 400 each 0   insulin aspart (NOVOLOG) 100 UNIT/ML injection Inject into the skin daily as needed for high blood sugar. Per patient, sliding scale with meals as needed     insulin glargine (LANTUS SOLOSTAR) 100 UNIT/ML Solostar Pen Inject 8 Units into  the skin daily. 7 mL 3   KERENDIA 20 MG TABS Take 1 tablet by mouth daily.     lidocaine-prilocaine (EMLA) cream Apply to affected area once 30 g 3   loratadine (CLARITIN) 10 MG tablet Take 10 mg by mouth as needed for allergies.     LORazepam (ATIVAN) 0.5 MG tablet Take 1 tablet (0.5 mg total) by mouth every 6 (six) hours as needed (Nausea or vomiting). 30 tablet 0   melatonin 5 MG TABS Take 5 mg by mouth at bedtime.     NEXLIZET 180-10 MG TABS TAKE 1 TABLET BY MOUTH DAILY. 30 tablet 11   omeprazole (PRILOSEC) 40 MG capsule Take 1 capsule (40 mg total) by mouth daily. 30 capsule 1   Pediatric Multivitamins-Iron (FLINTSTONES PLUS IRON) chewable tablet Chew 1 tablet by mouth 2 (two) times daily.     polyethylene glycol (MIRALAX / GLYCOLAX) 17 g packet Take 17 g by mouth daily.     Probiotic Product ([employer] PROBIOTIC PO) Take 1 capsule by mouth daily.      prochlorperazine (COMPAZINE) 10 MG tablet Take 1 tablet (10 mg total) by mouth every 6 (six) hours as needed (Nausea or vomiting). 30 tablet 1   Propylene Glycol (SYSTANE BALANCE OP) Place 1-2 drops into both eyes 3 (three) times daily as needed (for dry eyes).      SURE COMFORT PEN NEEDLES 32G X 4 MM MISC Use as instructed to inject insulin and Victoza daily. 200 each 0   tobramycin (TOBREX) 0.3 % ophthalmic ointment Place 1 application into both eyes as needed (Takes day before eye injections).     vortioxetine HBr (TRINTELLIX) 20 MG TABS tablet Take 1 tablet (20 mg total) by mouth at bedtime. 90 tablet 0   No current facility-administered medications for this visit.    PHYSICAL EXAMINATION: ECOG PERFORMANCE STATUS: 1 - Symptomatic but completely ambulatory  Vitals:   07/31/21 1447  BP: (!) 124/51  Pulse: (!) 56  Resp: 17  Temp: (!) 97.3 F (36.3 C)  SpO2: 100%   Filed Weights   07/31/21 1447  Weight: 176 lb 3.2 oz (79.9 kg)      LABORATORY DATA:  I have reviewed the data as listed    Latest Ref Rng & Units 07/31/2021    2:19  PM 07/30/2021   12:04 PM 07/11/2021    9:07 AM  CMP  Glucose 70 - 99 mg/dL 165  84  790   BUN 8 - 23 mg/dL 47  40  42   Creatinine 0.44 - 1.00 mg/dL 3.83  3.38  3.29   Sodium 135 - 145 mmol/L 140  140  142   Potassium 3.5 - 5.1 mmol/L 4.8  4.7  4.7   Chloride 98 - 111 mmol/L 107  106  108   CO2 22 - 32 mmol/L $RemoveB'29  26  30   'mgsSvtTw$ Calcium 8.9 - 10.3 mg/dL 8.8  8.7  9.6   Total Protein 6.5 - 8.1 g/dL 6.1   6.3   Total Bilirubin 0.3 - 1.2 mg/dL 0.4   0.4   Alkaline Phos 38 - 126 U/L 51   49   AST 15 - 41 U/L 28   27   ALT 0 - 44 U/L 14   13     Lab Results  Component Value Date   WBC 4.7 07/31/2021   HGB 10.0 (L) 07/31/2021   HCT 29.1 (L) 07/31/2021   MCV 95.7 07/31/2021   PLT 190 07/31/2021   NEUTROABS 2.8 07/31/2021    ASSESSMENT & PLAN:  Breast cancer of upper-outer quadrant of right female breast ([place]) 07/19/2014: Right breast cancer grade 2 IDC ER/PR negative HER2 positive Ki-67 90% treated with neoadjuvant chemo with TCHP x6 cycles followed by Herceptin maintenance, right lumpectomy had residual disease 4 cm 1/3 lymph node positive, ER/PR negative, adjuvant radiation and capecitabine completed 09/27/2015 12/06/2015: Recurrence: Right subpectoral lymph node and subcutaneous nodule, liver mass (biopsy adenocarcinoma consistent with upper GI or pancreaticobiliary primary, HER2 negative, ER negative) July 2019: Metastatic disease: TDM 1 followed by Herceptin maintenance, pertuzumab added 10/06/2018, Abraxane 12/31/2017-09/01/2018, Herceptin and pertuzumab maintenance   Prior treatment: Enhertu started 10/05/2020 switched to Herceptin maintenance 02/19/2021 Herceptin toxicities:   no adverse effects   02/04/2021: PET CT scan: Complete metabolic response, no evidence of hypermetabolic disease in the liver (mass measured 2.7 cm previously is now 1.5 cm with no uptake)  06/08/2021: PET CT scan: Liver metastases.  Right hepatic lobe mass measures 6 cm (previously it was 1.5 cm), medial to this there is  a 1 cm lesion with SUV of 5.3, mild right subpectoral lymph node hypermetabolism, sigmoid focal hypermetabolism is nonspecific ------------------------------------------------------------------------------------------------------------------------------------------- Current treatment: Enhertu lower dosage 06/20/2021 today cycle 3   Enhertu toxicities: Chemo-induced anemia: Monitoring Mild leukopenia: Okay to treat   We will increase the dosage of Enhertu with cycle 3 Denies any nausea or vomiting.  CT chest abdomen pelvis scheduled for 08/20/2021   Return to clinic in 3 weeks for cycle 4 and to discuss the results of the scans.    No orders of the defined types were placed in this encounter.  The patient has a good understanding of the overall plan. she agrees with it. she will call with any problems that may develop before the next visit here. Total time spent: 30 mins including face to face time and time spent for planning, charting and co-ordination of care   Harriette Ohara, MD 07/31/21    I Gardiner Coins am scribing for Dr. Lindi Adie  I have reviewed the above documentation for accuracy and completeness, and I agree with the above.

## 2021-07-31 NOTE — Patient Instructions (Signed)

## 2021-07-31 NOTE — Assessment & Plan Note (Signed)
07/19/2014: Right breast cancer grade 2 IDC ER/PR negative HER2 positive Ki-67 90% treated with neoadjuvant chemo with TCHP x6 cycles followed by Herceptin maintenance, right lumpectomy had residual disease 4 cm 1/3 lymph node positive, ER/PR negative, adjuvant radiation and capecitabine completed 09/27/2015 12/06/2015: Recurrence: Right subpectoral lymph node and subcutaneous nodule, liver mass (biopsy adenocarcinoma consistent with upper GI or pancreaticobiliary primary, HER2 negative, ER negative) July 2019: Metastatic disease: TDM 1 followed by Herceptin maintenance,pertuzumabadded 10/06/2018, Abraxane 12/31/2017-09/01/2018, Herceptin and pertuzumab maintenance  Prior treatment: Enhertu started 9/15/2022switched to Herceptin maintenance 02/19/2021 Herceptintoxicities:no adverse effects  02/04/2021: PET CT scan: Complete metabolic response, no evidence of hypermetabolic disease in the liver (mass measured 2.7 cm previously is now 1.5 cm with no uptake) 06/08/2021: PET CT scan: Liver metastases. Right hepatic lobe mass measures 6 cm (previously it was 1.5 cm), medial to this there is a 1 cm lesion with SUV of 5.3, mild right subpectoral lymph node hypermetabolism, sigmoid focal hypermetabolism is nonspecific ------------------------------------------------------------------------------------------------------------------------------------------- Current treatment: Enhertu lower dosage 06/20/2021 today cycle 3  Enhertu toxicities: 1. Chemo-induced anemia: Monitoring 2. Mild leukopenia: Okay to treat  Denies any nausea or vomiting.  CT chest abdomen pelvis scheduled for 08/20/2021  Return to clinic in 3 weeks for cycle 4 and to discuss the results of the scans.

## 2021-08-01 ENCOUNTER — Inpatient Hospital Stay: Payer: Medicare PPO

## 2021-08-01 ENCOUNTER — Encounter (HOSPITAL_COMMUNITY)
Admission: RE | Disposition: A | Discharge status: Home / Self Care | Payer: Self-pay | Source: Home / Self Care | Attending: Gastroenterology

## 2021-08-01 ENCOUNTER — Other Ambulatory Visit: Payer: Self-pay | Admitting: *Deleted

## 2021-08-01 ENCOUNTER — Ambulatory Visit (HOSPITAL_COMMUNITY)
Admission: RE | Admit: 2021-08-01 | Discharge: 2021-08-01 | Disposition: A | Discharge status: Home / Self Care | Payer: Medicare PPO | Attending: Hematology and Oncology | Admitting: Hematology and Oncology

## 2021-08-01 VITALS — BP 138/73 | HR 55 | Temp 98.1°F | Resp 16

## 2021-08-01 DIAGNOSIS — K5900 Constipation, unspecified: Secondary | ICD-10-CM | POA: Diagnosis not present

## 2021-08-01 DIAGNOSIS — K5902 Outlet dysfunction constipation: Secondary | ICD-10-CM | POA: Diagnosis not present

## 2021-08-01 DIAGNOSIS — C50411 Malignant neoplasm of upper-outer quadrant of right female breast: Secondary | ICD-10-CM

## 2021-08-01 HISTORY — PX: ANAL RECTAL MANOMETRY: SHX6358

## 2021-08-01 SURGERY — MANOMETRY, ANORECTAL

## 2021-08-01 MED ORDER — HEPARIN SOD (PORK) LOCK FLUSH 100 UNIT/ML IV SOLN
500.0000 [IU] | Freq: Once | INTRAVENOUS | Status: AC | PRN
  Administered 2021-08-01: 500 [IU]

## 2021-08-01 MED ORDER — DEXAMETHASONE SODIUM PHOSPHATE 10 MG/ML IJ SOLN
4.0000 mg | Freq: Once | INTRAMUSCULAR | Status: AC
  Administered 2021-08-01: 4 mg via INTRAVENOUS
  Filled 2021-08-01: qty 1

## 2021-08-01 MED ORDER — DEXTROSE 5 % IV SOLN: Freq: Once | INTRAVENOUS | Status: AC

## 2021-08-01 MED ORDER — DIPHENHYDRAMINE HCL 25 MG PO CAPS
25.0000 mg | ORAL_CAPSULE | Freq: Once | ORAL | Status: AC
  Administered 2021-08-01: 25 mg via ORAL
  Filled 2021-08-01: qty 1

## 2021-08-01 MED ORDER — PALONOSETRON HCL INJECTION 0.25 MG/5ML
0.2500 mg | Freq: Once | INTRAVENOUS | Status: AC
  Administered 2021-08-01: 0.25 mg via INTRAVENOUS
  Filled 2021-08-01: qty 5

## 2021-08-01 MED ORDER — ACETAMINOPHEN 325 MG PO TABS
650.0000 mg | ORAL_TABLET | Freq: Once | ORAL | Status: AC
  Administered 2021-08-01: 650 mg via ORAL
  Filled 2021-08-01: qty 2

## 2021-08-01 MED ORDER — SODIUM CHLORIDE 0.9% FLUSH
10.0000 mL | INTRAVENOUS | Status: DC | PRN
  Administered 2021-08-01: 10 mL

## 2021-08-01 MED ORDER — FAM-TRASTUZUMAB DERUXTECAN-NXKI CHEMO 100 MG IV SOLR
5.4000 mg/kg | Freq: Once | INTRAVENOUS | Status: AC
  Administered 2021-08-01: 420 mg via INTRAVENOUS
  Filled 2021-08-01: qty 21

## 2021-08-01 MED ORDER — SODIUM CHLORIDE 0.9 % IV SOLN
150.0000 mg | Freq: Once | INTRAVENOUS | Status: AC
  Administered 2021-08-01: 150 mg via INTRAVENOUS
  Filled 2021-08-01: qty 150

## 2021-08-01 NOTE — Progress Notes (Signed)
Anal manometry done per protocol. Pt tolerated well without distress or complication   

## 2021-08-01 NOTE — Progress Notes (Signed)
Chief Complaint:   OBESITY Darlene [name] is here to discuss her progress with her obesity treatment plan along with follow-up of her obesity related diagnoses. Darlene [name] is on keeping a food journal and adhering to recommended goals of 1800 calories and 100 grams of protein daily and states she is following her eating plan approximately 60% of the time. Darlene [name] states she is doing physical therapy for 60 minutes 1 time per week.  Today's visit was #: 66 Starting weight: 225 lbs Starting date: 08/09/2016 Today's weight: 170 lbs Today's date: 07/31/2021 Total lbs lost to date: 55 Total lbs lost since last in-office visit: 0  Interim History: Darlene [name] has gained some weight but some of her weight gain is due to muscle gain.  She notes some increase in sugar cravings.  Subjective:   1. Stress Carrin's breast cancer has recurred and she is doing chemo again.  She notes increased comfort eating, especially of sweets/breads and this is likely related to her stress.  2. Other depression with emotional eating Darlene [name] continues to work on [employer] behaviors.  She has no problems with her current medications, and her blood pressure is well controlled.  Assessment/Plan:   1. Stress Darlene [name] was offered comfort and encouragement, and emotional eating behavior strategies were discussed.  2. Other depression with emotional eating Darlene [name] will continue her medications, and we will refill Wellbutrin SR and Trintellix for 90 days with no refill.  - buPROPion (WELLBUTRIN SR) 200 MG 12 hr tablet; Take one tablet by mouth once daily.  Dispense: 90 tablet; Refill: 0 - vortioxetine HBr (TRINTELLIX) 20 MG TABS tablet; Take 1 tablet (20 mg total) by mouth at bedtime.  Dispense: 90 tablet; Refill: 0  3. Obesity, Current BMI 25.1 Darlene [name] is currently in the action stage of change. As such, her goal is to continue with weight loss efforts. She has agreed to keeping a food journal and  adhering to recommended goals of 1800 calories and 100 grams of protein daily.   Exercise goals: As is.   Behavioral modification strategies: increasing lean protein intake and no skipping meals.  Darlene [name] has agreed to follow-up with our clinic in 3 months. She was informed of the importance of frequent follow-up visits to maximize her success with intensive lifestyle modifications for her multiple health conditions.   Objective:   Blood pressure (!) 120/53, pulse (!) 55, temperature 98.1 F (36.7 C), height '5\' 9"'$  (1.753 m), weight 170 lb (77.1 kg), last menstrual period 10/22/2007, SpO2 97 %. Body mass index is 25.1 kg/m.  General: Cooperative, alert, well developed, in no acute distress. HEENT: Conjunctivae and lids unremarkable. Cardiovascular: Regular rhythm.  Lungs: Normal work of breathing. Neurologic: No focal deficits.   Lab Results  Component Value Date   CREATININE 2.15 (H) 07/31/2021   BUN 47 (H) 07/31/2021   NA 140 07/31/2021   K 4.8 07/31/2021   CL 107 07/31/2021   CO2 29 07/31/2021   Lab Results  Component Value Date   ALT 14 07/31/2021   AST 28 07/31/2021   ALKPHOS 51 07/31/2021   BILITOT 0.4 07/31/2021   Lab Results  Component Value Date   HGBA1C 5.9 (A) 05/23/2021   HGBA1C 6.3 (A) 11/23/2020   HGBA1C 5.3 07/04/2020   HGBA1C 5.2 01/04/2020   HGBA1C 5.4 10/05/2019   Lab Results  Component Value Date   INSULIN 7.6 01/25/2019   INSULIN 15.3 03/11/2017   INSULIN 18.3 08/09/2016   Lab Results  Component Value Date  TSH 1.45 04/11/2021   Lab Results  Component Value Date   CHOL 94 10/10/2020   HDL 55 10/10/2020   LDLCALC 26 10/10/2020   TRIG 63 10/10/2020   CHOLHDL 1.7 10/10/2020   Lab Results  Component Value Date   VD25OH 49.3 01/04/2020   VD25OH 43.6 10/05/2019   VD25OH 43.5 08/27/2018   Lab Results  Component Value Date   WBC 4.7 07/31/2021   HGB 10.0 (L) 07/31/2021   HCT 29.1 (L) 07/31/2021   MCV 95.7 07/31/2021   PLT 190  07/31/2021   Lab Results  Component Value Date   IRON 58 02/18/2013   TIBC 305 02/18/2013   FERRITIN 133 10/31/2014   Attestation Statements:   Reviewed by clinician on day of visit: allergies, medications, problem list, medical history, surgical history, family history, social history, and previous encounter notes.  Time spent on visit including pre-visit chart review and post-visit care and charting was 41 minutes.   I, [name], am acting as transcriptionist for Dennard Nip, MD.  I have reviewed the above documentation for accuracy and completeness, and I agree with the above. -  Dennard Nip, MD

## 2021-08-01 NOTE — Patient Instructions (Signed)
[facility]  Discharge Instructions: Thank you for choosing [facility] to provide your oncology and hematology care.   If you have a lab appointment with the [facility], please go directly to the [facility] and check in at the registration area.   Wear comfortable clothing and clothing appropriate for easy access to any Portacath or PICC line.   We strive to give you quality time with your provider. You may need to reschedule your appointment if you arrive late (15 or more minutes).  Arriving late affects you and other patients whose appointments are after yours.  Also, if you miss three or more appointments without notifying the office, you may be dismissed from the clinic at the provider's discretion.      For prescription refill requests, have your pharmacy contact our office and allow 72 hours for refills to be completed.    Today you received the following chemotherapy and/or immunotherapy agents: Enhertu      To help prevent nausea and vomiting after your treatment, we encourage you to take your nausea medication as directed.  BELOW ARE SYMPTOMS THAT SHOULD BE REPORTED IMMEDIATELY: *FEVER GREATER THAN 100.4 F (38 C) OR HIGHER *CHILLS OR SWEATING *NAUSEA AND VOMITING THAT IS NOT CONTROLLED WITH YOUR NAUSEA MEDICATION *UNUSUAL SHORTNESS OF BREATH *UNUSUAL BRUISING OR BLEEDING *URINARY PROBLEMS (pain or burning when urinating, or frequent urination) *BOWEL PROBLEMS (unusual diarrhea, constipation, pain near the anus) TENDERNESS IN MOUTH AND THROAT WITH OR WITHOUT PRESENCE OF ULCERS (sore throat, sores in mouth, or a toothache) UNUSUAL RASH, SWELLING OR PAIN  UNUSUAL VAGINAL DISCHARGE OR ITCHING   Items with * indicate a potential emergency and should be followed up as soon as possible or go to the Emergency Department if any problems should occur.  Please show the CHEMOTHERAPY ALERT CARD or IMMUNOTHERAPY ALERT CARD at check-in to  the Emergency Department and triage nurse.  Should you have questions after your visit or need to cancel or reschedule your appointment, please contact [facility]  Dept: [phone]  and follow the prompts.  Office hours are 8:00 a.m. to 4:30 p.m. Monday - Friday. Please note that voicemails left after 4:00 p.m. may not be returned until the following business day.  We are closed weekends and major holidays. You have access to a nurse at all times for urgent questions. Please call the main number to the clinic Dept: [phone] and follow the prompts.   For any non-urgent questions, you may also contact your provider using MyChart. We now offer e-Visits for anyone 74 and older to request care online for non-urgent symptoms. For details visit [email or web address].   Also download the MyChart app! Go to the app store, search "MyChart", open the app, select [facility], and log in with your MyChart username and password.  Masks are optional in the cancer centers. If you would like for your care team to wear a mask while they are taking care of you, please let them know. For doctor visits, patients may have with them one support person who is at least 69 years old. At this time, visitors are not allowed in the infusion area.

## 2021-08-02 ENCOUNTER — Other Ambulatory Visit (HOSPITAL_COMMUNITY): Payer: Self-pay | Admitting: *Deleted

## 2021-08-02 ENCOUNTER — Other Ambulatory Visit: Payer: Self-pay

## 2021-08-02 ENCOUNTER — Encounter (HOSPITAL_COMMUNITY): Payer: Self-pay | Admitting: Gastroenterology

## 2021-08-02 NOTE — Telephone Encounter (Signed)
[facility] mail order pharmacy is requesting a refill on this medication. Please address

## 2021-08-03 MED ORDER — KERENDIA 10 MG PO TABS: 20.0000 mg | ORAL_TABLET | Freq: Every day | ORAL | 3 refills | Status: DC

## 2021-08-07 ENCOUNTER — Other Ambulatory Visit (HOSPITAL_COMMUNITY): Payer: Self-pay | Admitting: *Deleted

## 2021-08-07 ENCOUNTER — Other Ambulatory Visit: Payer: Self-pay | Admitting: *Deleted

## 2021-08-07 ENCOUNTER — Other Ambulatory Visit (HOSPITAL_COMMUNITY): Payer: Self-pay

## 2021-08-07 DIAGNOSIS — E118 Type 2 diabetes mellitus with unspecified complications: Secondary | ICD-10-CM | POA: Diagnosis not present

## 2021-08-07 DIAGNOSIS — Z9181 History of falling: Secondary | ICD-10-CM | POA: Diagnosis not present

## 2021-08-07 DIAGNOSIS — E1165 Type 2 diabetes mellitus with hyperglycemia: Secondary | ICD-10-CM | POA: Diagnosis not present

## 2021-08-07 DIAGNOSIS — I10 Essential (primary) hypertension: Secondary | ICD-10-CM | POA: Diagnosis not present

## 2021-08-07 DIAGNOSIS — R269 Unspecified abnormalities of gait and mobility: Secondary | ICD-10-CM | POA: Diagnosis not present

## 2021-08-07 DIAGNOSIS — M25671 Stiffness of right ankle, not elsewhere classified: Secondary | ICD-10-CM | POA: Diagnosis not present

## 2021-08-07 DIAGNOSIS — M6259 Muscle wasting and atrophy, not elsewhere classified, multiple sites: Secondary | ICD-10-CM | POA: Diagnosis not present

## 2021-08-07 DIAGNOSIS — M25572 Pain in left ankle and joints of left foot: Secondary | ICD-10-CM | POA: Diagnosis not present

## 2021-08-07 DIAGNOSIS — F339 Major depressive disorder, recurrent, unspecified: Secondary | ICD-10-CM | POA: Diagnosis not present

## 2021-08-07 MED ORDER — KERENDIA 20 MG PO TABS: 1.0000 | ORAL_TABLET | Freq: Every day | ORAL | 6 refills | Status: DC

## 2021-08-08 MED ORDER — NEXLIZET 180-10 MG PO TABS: 1.0000 | ORAL_TABLET | Freq: Every day | ORAL | 3 refills | Status: DC

## 2021-08-13 ENCOUNTER — Other Ambulatory Visit: Payer: Self-pay

## 2021-08-14 DIAGNOSIS — R269 Unspecified abnormalities of gait and mobility: Secondary | ICD-10-CM | POA: Diagnosis not present

## 2021-08-14 DIAGNOSIS — K5902 Outlet dysfunction constipation: Secondary | ICD-10-CM

## 2021-08-14 DIAGNOSIS — M25671 Stiffness of right ankle, not elsewhere classified: Secondary | ICD-10-CM | POA: Diagnosis not present

## 2021-08-14 DIAGNOSIS — E118 Type 2 diabetes mellitus with unspecified complications: Secondary | ICD-10-CM | POA: Diagnosis not present

## 2021-08-14 DIAGNOSIS — M6259 Muscle wasting and atrophy, not elsewhere classified, multiple sites: Secondary | ICD-10-CM | POA: Diagnosis not present

## 2021-08-14 DIAGNOSIS — Z9181 History of falling: Secondary | ICD-10-CM | POA: Diagnosis not present

## 2021-08-14 DIAGNOSIS — F339 Major depressive disorder, recurrent, unspecified: Secondary | ICD-10-CM | POA: Diagnosis not present

## 2021-08-14 DIAGNOSIS — I10 Essential (primary) hypertension: Secondary | ICD-10-CM | POA: Diagnosis not present

## 2021-08-14 DIAGNOSIS — M25572 Pain in left ankle and joints of left foot: Secondary | ICD-10-CM | POA: Diagnosis not present

## 2021-08-17 ENCOUNTER — Other Ambulatory Visit: Payer: Self-pay | Admitting: *Deleted

## 2021-08-17 ENCOUNTER — Inpatient Hospital Stay: Payer: Medicare PPO

## 2021-08-17 ENCOUNTER — Other Ambulatory Visit: Payer: Self-pay

## 2021-08-17 DIAGNOSIS — D6481 Anemia due to antineoplastic chemotherapy: Secondary | ICD-10-CM | POA: Diagnosis not present

## 2021-08-17 DIAGNOSIS — C50411 Malignant neoplasm of upper-outer quadrant of right female breast: Secondary | ICD-10-CM

## 2021-08-17 DIAGNOSIS — Z95828 Presence of other vascular implants and grafts: Secondary | ICD-10-CM

## 2021-08-17 DIAGNOSIS — D72819 Decreased white blood cell count, unspecified: Secondary | ICD-10-CM | POA: Diagnosis not present

## 2021-08-17 DIAGNOSIS — Z171 Estrogen receptor negative status [ER-]: Secondary | ICD-10-CM | POA: Diagnosis not present

## 2021-08-17 DIAGNOSIS — C787 Secondary malignant neoplasm of liver and intrahepatic bile duct: Secondary | ICD-10-CM | POA: Diagnosis not present

## 2021-08-17 DIAGNOSIS — K5900 Constipation, unspecified: Secondary | ICD-10-CM

## 2021-08-17 LAB — COMPREHENSIVE METABOLIC PANEL
ALT: 12 U/L (ref 0–44)
AST: 24 U/L (ref 15–41)
Albumin: 3.8 g/dL (ref 3.5–5.0)
Alkaline Phosphatase: 56 U/L (ref 38–126)
Anion gap: 5 (ref 5–15)
BUN: 47 mg/dL — ABNORMAL HIGH (ref 8–23)
CO2: 27 mmol/L (ref 22–32)
Calcium: 9 mg/dL (ref 8.9–10.3)
Chloride: 109 mmol/L (ref 98–111)
Creatinine, Ser: 2.13 mg/dL — ABNORMAL HIGH (ref 0.44–1.00)
GFR, Estimated: 25 mL/min — ABNORMAL LOW (ref >60)
Glucose, Bld: 199 mg/dL — ABNORMAL HIGH (ref 70–99)
Potassium: 4.5 mmol/L (ref 3.5–5.1)
Sodium: 141 mmol/L (ref 135–145)
Total Bilirubin: 0.4 mg/dL (ref 0.3–1.2)
Total Protein: 6.4 g/dL — ABNORMAL LOW (ref 6.5–8.1)

## 2021-08-17 LAB — CBC WITH DIFFERENTIAL/PLATELET
Abs Immature Granulocytes: 0.01 10*3/uL (ref 0.00–0.07)
Basophils Absolute: 0 10*3/uL (ref 0.0–0.1)
Basophils Relative: 0 %
Eosinophils Absolute: 0.1 10*3/uL (ref 0.0–0.5)
Eosinophils Relative: 3 %
HCT: 29.1 % — ABNORMAL LOW (ref 36.0–46.0)
Hemoglobin: 10.1 g/dL — ABNORMAL LOW (ref 12.0–15.0)
Immature Granulocytes: 0 %
Lymphocytes Relative: 17 %
Lymphs Abs: 0.8 10*3/uL (ref 0.7–4.0)
MCH: 33.6 pg (ref 26.0–34.0)
MCHC: 34.7 g/dL (ref 30.0–36.0)
MCV: 96.7 fL (ref 80.0–100.0)
Monocytes Absolute: 0.3 10*3/uL (ref 0.1–1.0)
Monocytes Relative: 7 %
Neutro Abs: 3.5 10*3/uL (ref 1.7–7.7)
Neutrophils Relative %: 73 %
Platelets: 174 10*3/uL (ref 150–400)
RBC: 3.01 MIL/uL — ABNORMAL LOW (ref 3.87–5.11)
RDW: 15.1 % (ref 11.5–15.5)
WBC: 4.8 10*3/uL (ref 4.0–10.5)
nRBC: 0 % (ref 0.0–0.2)

## 2021-08-17 LAB — CEA (IN HOUSE-CHCC): CEA (CHCC-In House): 673.06 ng/mL — ABNORMAL HIGH (ref 0.00–5.00)

## 2021-08-17 MED ORDER — SODIUM CHLORIDE 0.9% FLUSH
10.0000 mL | Freq: Once | INTRAVENOUS | Status: AC
  Administered 2021-08-17: 10 mL

## 2021-08-17 MED ORDER — HEPARIN SOD (PORK) LOCK FLUSH 100 UNIT/ML IV SOLN
500.0000 [IU] | Freq: Once | INTRAVENOUS | Status: AC
  Administered 2021-08-17: 500 [IU]

## 2021-08-17 NOTE — Progress Notes (Signed)
Per MD due to pt kidney function, CT CAP needing to be ordered without contrast.  Orders placed. Pt notified and verbalized understanding.

## 2021-08-17 NOTE — Progress Notes (Signed)
Patient Care Team: Isaac Bliss, Rayford Halsted, MD as PCP - General (Internal Medicine) Larey Dresser, MD as PCP - Advanced Heart Failure (Cardiology) Stark Klein, MD as Consulting Physician (General Surgery) Jacelyn Pi, MD as Consulting Physician (Endocrinology) [name], MD as Consulting Physician (Plastic Surgery) Gery Pray, MD as Consulting Physician (Radiation Oncology) Juanita Craver, MD as Consulting Physician (Gastroenterology) Alda Berthold, DO as Consulting Physician (Neurology) Starlyn Skeans, MD as Consulting Physician (Family Medicine) Sherlynn Stalls, MD as Consulting Physician (Ophthalmology) Salvadore Dom, MD as Consulting Physician (Obstetrics and Gynecology) Raina Mina, RPH-CPP (Pharmacist) Nicholas Lose, MD as Consulting Physician (Hematology and Oncology)  DIAGNOSIS: No diagnosis found.  SUMMARY OF ONCOLOGIC HISTORY: Oncology History  Breast cancer of upper-outer quadrant of right female breast ([place])  07/19/2014 Initial Biopsy   (R) breast needle biopsy (10:00): IDC, DCIS, grade 2. ER-, PR-, HER2+ (ratio 2.42). Ki67 90%.    07/22/2014 Initial Diagnosis   Breast cancer of upper-outer quadrant of right female breast   08/08/2014 - 11/21/2014 Neo-Adjuvant Chemotherapy   Taxotere/Carbo/Herceptin/Perjeta x 6 cycles.    12/12/2014 - 10/02/2015 Chemotherapy   Maintenance Herceptin (to complete 1 year of therapy).    12/27/2014 Surgery   (R) lumpectomy with [facility]): IDC, grade 2, spanning 4 cm, associated high grade DCIS. Negative margins. 1/3 SLN (+).   ER/PR repeated and remain negative.   ypT2, ypN1a: Stage IIB   01/06/2015 Surgery   Bilateral breast mammoplasty ([place]): Right breast path with intralymphatic emboli of ductal carcinoma. Left breast benign.    02/02/2015 Imaging   CT chest: No findings of metastatic disease in the chest. Right axillary fluid density lesion likely postoperative seroma or hematoma. Small  left subpectoral nodes warrant followup attention.   02/02/2015 Imaging   Bone scan: No scintigraphic evidence of osseous metastatic disease   02/16/2015 Surgery   (R) mastectomy and ALND [facility]): Scattered foci of high grade DCIS, scattered intralymphatic tumor emboli. Margins negative. 0/15 LNs.    04/05/2015 - 05/19/2015 Radiation Therapy   Adjuvant XRT ([place]). The right chest wall was treated to 50.4 Gy in 28 fractions at 1.8 Gy per fraction.  The right mastectomy scar was treated to 10 Gy in 5 fractions at 2 Gy per fraction.    12/06/2015 PET scan   Right subpectoral lymph node measuring 9 mm and exhibits malignant range FDG uptake, suspicious for metastatic adenopathy. Subcutaneous nodule within the upper left chest wall exhibits malignant range uptake, which is indeterminate; this is at the site of previous Port-A-Cath and is favored to represent postsurgical change. Indeterminate left adrenal nodules which exhibit mild uptake, favored to represent benign adenoma.   08/11/2017 - 11/03/2017 Chemotherapy   The patient had ado-trastuzumab emtansine (KADCYLA) 320 mg in sodium chloride 0.9 % 250 mL chemo infusion, 3.6 mg/kg = 320 mg, Intravenous, Once, 4 of 5 cycles Administration: 320 mg (08/12/2017), 320 mg (09/01/2017), 320 mg (09/23/2017)  for chemotherapy treatment.    11/18/2017 - 08/30/2020 Chemotherapy   The patient had trastuzumab (HERCEPTIN) 546 mg in sodium chloride 0.9 % 250 mL chemo infusion, 6 mg/kg = 546 mg (100 % of original dose 6 mg/kg), Intravenous,  Once, 2 of 2 cycles Dose modification: 6 mg/kg (original dose 6 mg/kg, Cycle 1, Reason: Provider Judgment) Administration: 546 mg (11/18/2017), 546 mg (12/16/2017) pertuzumab (PERJETA) 420 mg in sodium chloride 0.9 % 250 mL chemo infusion, 420 mg (100 % of original dose 420 mg), Intravenous, Once, 15 of 15 cycles Dose  modification: 420 mg (original dose 420 mg, Cycle 18), 420 mg (original dose 420 mg, Cycle 27) Administration: 420  mg (10/06/2018), 420 mg (10/27/2018), 420 mg (11/18/2018), 420 mg (12/08/2018), 420 mg (12/29/2018), 420 mg (01/19/2019), 420 mg (02/16/2019), 420 mg (03/16/2019), 420 mg (04/13/2019), 420 mg (06/08/2019), 420 mg (07/06/2019), 420 mg (08/03/2019), 420 mg (08/31/2019), 420 mg (09/28/2019), 420 mg (05/19/2019) trastuzumab-anns (KANJINTI) 546 mg in sodium chloride 0.9 % 250 mL chemo infusion, 6 mg/kg = 546 mg (100 % of original dose 6 mg/kg), Intravenous,  Once, 30 of 30 cycles Dose modification: 6 mg/kg (original dose 6 mg/kg, Cycle 3, Reason: Other (see comments), Comment: requested step therapy by [facility] insurance), 4 mg/kg (original dose 6 mg/kg, Cycle 9, Reason: Provider Judgment), 6 mg/kg (original dose 6 mg/kg, Cycle 17, Reason: Provider Judgment), 4 mg/kg (original dose 6 mg/kg, Cycle 27, Reason: Provider Judgment), 6 mg/kg (original dose 6 mg/kg, Cycle 27, Reason: Provider Judgment) Administration: 546 mg (01/13/2018), 546 mg (02/10/2018), 546 mg (03/03/2018), 546 mg (03/24/2018), 546 mg (04/14/2018), 546 mg (05/05/2018), 357 mg (05/26/2018), 357 mg (06/09/2018), 357 mg (06/23/2018), 357 mg (07/07/2018), 357 mg (07/21/2018), 357 mg (08/04/2018), 357 mg (08/18/2018), 357 mg (09/01/2018), 546 mg (09/15/2018), 546 mg (10/06/2018), 546 mg (10/27/2018), 546 mg (11/18/2018), 546 mg (12/08/2018), 546 mg (12/29/2018), 546 mg (01/19/2019), 546 mg (02/16/2019), 480 mg (03/16/2019), 483 mg (04/13/2019), 450 mg (06/08/2019), 450 mg (07/06/2019), 450 mg (08/03/2019), 450 mg (08/31/2019), 450 mg (09/28/2019)  for chemotherapy treatment.    12/31/2017 - 09/01/2018 Chemotherapy   The patient had PACLitaxel-protein bound (ABRAXANE) chemo infusion 175 mg, 80 mg/m2 = 175 mg (100 % of original dose 80 mg/m2), Intravenous,  Once, 5 of 10 cycles Dose modification: 80 mg/m2 (original dose 80 mg/m2, Cycle 11) Administration: 175 mg (07/07/2018), 175 mg (07/21/2018), 175 mg (08/04/2018), 175 mg (08/18/2018), 175 mg (09/01/2018) PACLitaxel-protein bound (ABRAXANE) chemo  infusion 200 mg, 100 mg/m2 = 200 mg, Intravenous, Once, 10 of 10 cycles Dose modification: 80 mg/m2 (original dose 100 mg/m2, Cycle 2, Reason: Provider Judgment), 80 mg/m2 (original dose 100 mg/m2, Cycle 2, Reason: Provider Judgment) Administration: 200 mg (12/31/2017), 200 mg (01/06/2018), 175 mg (01/20/2018), 175 mg (01/27/2018), 175 mg (02/10/2018), 175 mg (03/03/2018), 175 mg (03/10/2018), 175 mg (03/24/2018), 175 mg (04/07/2018), 175 mg (04/14/2018), 175 mg (04/28/2018), 175 mg (05/05/2018), 175 mg (05/26/2018), 175 mg (06/09/2018), 175 mg (05/19/2018)  for chemotherapy treatment.    10/05/2020 - 01/18/2021 Chemotherapy   Patient is on Treatment Plan : BREAST METASTATIC fam-trastuzumab deruxtecan-nxki (Enhertu) q21d     02/19/2021 - 05/14/2021 Chemotherapy   Patient is on Treatment Plan : BREAST Trastuzumab q21d x 13 cycles     06/20/2021 -  Chemotherapy   Patient is on Treatment Plan : BREAST METASTATIC fam-trastuzumab deruxtecan-nxki (Enhertu) q21d     Metastasis to liver ([place])  08/04/2017 Initial Diagnosis   Metastasis to liver ([place])   08/12/2017 - 10/14/2017 Chemotherapy   The patient had ado-trastuzumab emtansine (KADCYLA) 320 mg in sodium chloride 0.9 % 250 mL chemo infusion, 3.6 mg/kg = 320 mg, Intravenous, Once, 4 of 5 cycles Administration: 320 mg (08/12/2017), 320 mg (09/01/2017), 320 mg (09/23/2017)  for chemotherapy treatment.    11/18/2017 - 08/30/2020 Chemotherapy   The patient had trastuzumab (HERCEPTIN) 546 mg in sodium chloride 0.9 % 250 mL chemo infusion, 6 mg/kg = 546 mg (100 % of original dose 6 mg/kg), Intravenous,  Once, 2 of 2 cycles Dose modification: 6 mg/kg (original dose 6 mg/kg, Cycle 1, Reason: Provider Judgment)  Administration: 546 mg (11/18/2017), 546 mg (12/16/2017) pertuzumab (PERJETA) 420 mg in sodium chloride 0.9 % 250 mL chemo infusion, 420 mg (100 % of original dose 420 mg), Intravenous, Once, 15 of 15 cycles Dose modification: 420 mg (original dose 420 mg, Cycle 18), 420 mg  (original dose 420 mg, Cycle 27) Administration: 420 mg (10/06/2018), 420 mg (10/27/2018), 420 mg (11/18/2018), 420 mg (12/08/2018), 420 mg (12/29/2018), 420 mg (01/19/2019), 420 mg (02/16/2019), 420 mg (03/16/2019), 420 mg (04/13/2019), 420 mg (06/08/2019), 420 mg (07/06/2019), 420 mg (08/03/2019), 420 mg (08/31/2019), 420 mg (09/28/2019), 420 mg (05/19/2019) trastuzumab-anns (KANJINTI) 546 mg in sodium chloride 0.9 % 250 mL chemo infusion, 6 mg/kg = 546 mg (100 % of original dose 6 mg/kg), Intravenous,  Once, 30 of 30 cycles Dose modification: 6 mg/kg (original dose 6 mg/kg, Cycle 3, Reason: Other (see comments), Comment: requested step therapy by [facility] insurance), 4 mg/kg (original dose 6 mg/kg, Cycle 9, Reason: Provider Judgment), 6 mg/kg (original dose 6 mg/kg, Cycle 17, Reason: Provider Judgment), 4 mg/kg (original dose 6 mg/kg, Cycle 27, Reason: Provider Judgment), 6 mg/kg (original dose 6 mg/kg, Cycle 27, Reason: Provider Judgment) Administration: 546 mg (01/13/2018), 546 mg (02/10/2018), 546 mg (03/03/2018), 546 mg (03/24/2018), 546 mg (04/14/2018), 546 mg (05/05/2018), 357 mg (05/26/2018), 357 mg (06/09/2018), 357 mg (06/23/2018), 357 mg (07/07/2018), 357 mg (07/21/2018), 357 mg (08/04/2018), 357 mg (08/18/2018), 357 mg (09/01/2018), 546 mg (09/15/2018), 546 mg (10/06/2018), 546 mg (10/27/2018), 546 mg (11/18/2018), 546 mg (12/08/2018), 546 mg (12/29/2018), 546 mg (01/19/2019), 546 mg (02/16/2019), 480 mg (03/16/2019), 483 mg (04/13/2019), 450 mg (06/08/2019), 450 mg (07/06/2019), 450 mg (08/03/2019), 450 mg (08/31/2019), 450 mg (09/28/2019)  for chemotherapy treatment.      CHIEF COMPLIANT: Cycle 4 Enhertu and discuss scans  INTERVAL HISTORY: Darlene [name] is a 71 y.o. with above-mentioned history of HER-2 positive, estrogen receptor negative breast cancer, was on chemotherapy with Enhertu. She presents to the clinic today for follow-up.   ALLERGIES:  is allergic to nsaids and tape.  MEDICATIONS:  Current Outpatient  Medications  Medication Sig Dispense Refill   Accu-Chek Softclix Lancets lancets 1 each by Other route 4 (four) times daily. Test blood sugars 4 x times daily 200 each 1   aspirin EC 81 MG tablet Take 1 tablet (81 mg total) by mouth daily. 90 tablet 3   Bempedoic Acid-Ezetimibe (NEXLIZET) 180-10 MG TABS Take 1 tablet by mouth daily. 90 tablet 3   Blood Glucose Calibration (ACCU-CHEK GUIDE CONTROL) LIQD 1 Bottle by In Vitro route as needed. 1 each 0   Blood Glucose Monitoring Suppl (ACCU-CHEK GUIDE) w/Device KIT 1 each by Does not apply route 4 (four) times daily. Use to test blood sugars 4x a day 1 kit 0   buPROPion (WELLBUTRIN SR) 200 MG 12 hr tablet Take one tablet by mouth once daily. 90 tablet 0   CALCIUM CITRATE-VITAMIN D PO Take 1 tablet by mouth 3 (three) times daily.      carvedilol (COREG) 6.25 MG tablet Take 1 tablet (6.25 mg total) by mouth 2 (two) times daily. 180 tablet 3   cholecalciferol (VITAMIN D) 1000 units tablet Take 1,000 Units by mouth daily.     Continuous Blood Gluc Sensor (FREESTYLE LIBRE 2 SENSOR) MISC      Cyanocobalamin (VITAMIN B-12 PO) Take 1 tablet by mouth every 14 (fourteen) days.     diphenhydrAMINE (BENADRYL) 25 MG tablet Take 25 mg by mouth daily as needed for allergies or sleep.  glucose blood (ACCU-CHEK GUIDE) test strip Use Accu Chek test strips to check blood sugar 4 times daily. DX:E11.21 400 each 0   insulin aspart (NOVOLOG) 100 UNIT/ML injection Inject into the skin daily as needed for high blood sugar. Per patient, sliding scale with meals as needed     insulin glargine (LANTUS SOLOSTAR) 100 UNIT/ML Solostar Pen Inject 8 Units into the skin daily. 7 mL 3   KERENDIA 20 MG TABS Take 1 tablet by mouth daily. 30 tablet 6   lidocaine-prilocaine (EMLA) cream Apply to affected area once 30 g 3   loratadine (CLARITIN) 10 MG tablet Take 10 mg by mouth as needed for allergies.     LORazepam (ATIVAN) 0.5 MG tablet Take 1 tablet (0.5 mg total) by mouth every 6  (six) hours as needed (Nausea or vomiting). 30 tablet 0   melatonin 5 MG TABS Take 5 mg by mouth at bedtime.     omeprazole (PRILOSEC) 40 MG capsule Take 1 capsule (40 mg total) by mouth daily. 30 capsule 1   Pediatric Multivitamins-Iron (FLINTSTONES PLUS IRON) chewable tablet Chew 1 tablet by mouth 2 (two) times daily.     polyethylene glycol (MIRALAX / GLYCOLAX) 17 g packet Take 17 g by mouth daily.     Probiotic Product ([employer] PROBIOTIC PO) Take 1 capsule by mouth daily.      prochlorperazine (COMPAZINE) 10 MG tablet Take 1 tablet (10 mg total) by mouth every 6 (six) hours as needed (Nausea or vomiting). 30 tablet 1   Propylene Glycol (SYSTANE BALANCE OP) Place 1-2 drops into both eyes 3 (three) times daily as needed (for dry eyes).      SURE COMFORT PEN NEEDLES 32G X 4 MM MISC Use as instructed to inject insulin and Victoza daily. 200 each 0   tobramycin (TOBREX) 0.3 % ophthalmic ointment Place 1 application into both eyes as needed (Takes day before eye injections).     vortioxetine HBr (TRINTELLIX) 20 MG TABS tablet Take 1 tablet (20 mg total) by mouth at bedtime. 90 tablet 0   No current facility-administered medications for this visit.    PHYSICAL EXAMINATION: ECOG PERFORMANCE STATUS: {CHL ONC ECOG PS:[id number]}  There were no vitals filed for this visit. There were no vitals filed for this visit.  BREAST:*** No palpable masses or nodules in either right or left breasts. No palpable axillary supraclavicular or infraclavicular adenopathy no breast tenderness or nipple discharge. (exam performed in the presence of a chaperone)  LABORATORY DATA:  I have reviewed the data as listed    Latest Ref Rng & Units 08/17/2021   10:40 AM 07/31/2021    2:19 PM 07/30/2021   12:04 PM  CMP  Glucose 70 - 99 mg/dL 199  114  84   BUN 8 - 23 mg/dL 47  47  40   Creatinine 0.44 - 1.00 mg/dL 2.13  2.15  1.95   Sodium 135 - 145 mmol/L 141  140  140   Potassium 3.5 - 5.1 mmol/L 4.5  4.8  4.7   Chloride  98 - 111 mmol/L 109  107  106   CO2 22 - 32 mmol/L $RemoveB'27  29  26   'BpDBTFJe$ Calcium 8.9 - 10.3 mg/dL 9.0  8.8  8.7   Total Protein 6.5 - 8.1 g/dL 6.4  6.1    Total Bilirubin 0.3 - 1.2 mg/dL 0.4  0.4    Alkaline Phos 38 - 126 U/L 56  51    AST 15 - 41  U/L 24  28    ALT 0 - 44 U/L 12  14      Lab Results  Component Value Date   WBC 4.8 08/17/2021   HGB 10.1 (L) 08/17/2021   HCT 29.1 (L) 08/17/2021   MCV 96.7 08/17/2021   PLT 174 08/17/2021   NEUTROABS 3.5 08/17/2021    ASSESSMENT & PLAN:  No problem-specific Assessment & Plan notes found for this encounter.    No orders of the defined types were placed in this encounter.  The patient has a good understanding of the overall plan. she agrees with it. she will call with any problems that may develop before the next visit here. Total time spent: 30 mins including face to face time and time spent for planning, charting and co-ordination of care   Suzzette Righter, [place] 08/17/21    I Gardiner Coins am scribing for Dr. Lindi Adie  ***

## 2021-08-20 ENCOUNTER — Ambulatory Visit (HOSPITAL_COMMUNITY)
Admission: RE | Admit: 2021-08-20 | Discharge: 2021-08-20 | Disposition: A | Discharge status: Home / Self Care | Payer: Medicare PPO | Source: Ambulatory Visit | Attending: Hematology and Oncology | Admitting: Hematology and Oncology

## 2021-08-20 DIAGNOSIS — Z853 Personal history of malignant neoplasm of breast: Secondary | ICD-10-CM | POA: Diagnosis not present

## 2021-08-20 DIAGNOSIS — J929 Pleural plaque without asbestos: Secondary | ICD-10-CM | POA: Diagnosis not present

## 2021-08-20 DIAGNOSIS — N200 Calculus of kidney: Secondary | ICD-10-CM | POA: Diagnosis not present

## 2021-08-20 DIAGNOSIS — I700 Atherosclerosis of aorta: Secondary | ICD-10-CM | POA: Diagnosis not present

## 2021-08-20 DIAGNOSIS — C50411 Malignant neoplasm of upper-outer quadrant of right female breast: Secondary | ICD-10-CM | POA: Diagnosis not present

## 2021-08-20 DIAGNOSIS — K8020 Calculus of gallbladder without cholecystitis without obstruction: Secondary | ICD-10-CM | POA: Diagnosis not present

## 2021-08-20 DIAGNOSIS — C787 Secondary malignant neoplasm of liver and intrahepatic bile duct: Secondary | ICD-10-CM | POA: Diagnosis not present

## 2021-08-20 DIAGNOSIS — D259 Leiomyoma of uterus, unspecified: Secondary | ICD-10-CM | POA: Diagnosis not present

## 2021-08-20 DIAGNOSIS — J984 Other disorders of lung: Secondary | ICD-10-CM | POA: Diagnosis not present

## 2021-08-21 ENCOUNTER — Other Ambulatory Visit: Payer: Self-pay

## 2021-08-21 DIAGNOSIS — M6259 Muscle wasting and atrophy, not elsewhere classified, multiple sites: Secondary | ICD-10-CM | POA: Diagnosis not present

## 2021-08-21 DIAGNOSIS — E118 Type 2 diabetes mellitus with unspecified complications: Secondary | ICD-10-CM | POA: Diagnosis not present

## 2021-08-21 DIAGNOSIS — M25572 Pain in left ankle and joints of left foot: Secondary | ICD-10-CM | POA: Diagnosis not present

## 2021-08-21 DIAGNOSIS — M25671 Stiffness of right ankle, not elsewhere classified: Secondary | ICD-10-CM | POA: Diagnosis not present

## 2021-08-21 DIAGNOSIS — I10 Essential (primary) hypertension: Secondary | ICD-10-CM | POA: Diagnosis not present

## 2021-08-21 DIAGNOSIS — Z9181 History of falling: Secondary | ICD-10-CM | POA: Diagnosis not present

## 2021-08-21 DIAGNOSIS — R269 Unspecified abnormalities of gait and mobility: Secondary | ICD-10-CM | POA: Diagnosis not present

## 2021-08-21 DIAGNOSIS — F339 Major depressive disorder, recurrent, unspecified: Secondary | ICD-10-CM | POA: Diagnosis not present

## 2021-08-21 MED FILL — Fosaprepitant Dimeglumine For IV Infusion 150 MG (Base Eq): INTRAVENOUS | Qty: 5 | Status: AC

## 2021-08-22 ENCOUNTER — Inpatient Hospital Stay: Payer: Medicare PPO

## 2021-08-22 ENCOUNTER — Ambulatory Visit: Payer: Medicare PPO

## 2021-08-22 ENCOUNTER — Other Ambulatory Visit: Payer: Self-pay

## 2021-08-22 ENCOUNTER — Other Ambulatory Visit: Payer: Medicare PPO

## 2021-08-22 ENCOUNTER — Inpatient Hospital Stay: Payer: Medicare PPO | Attending: Oncology | Admitting: Hematology and Oncology

## 2021-08-22 ENCOUNTER — Other Ambulatory Visit: Payer: Self-pay | Admitting: Hematology and Oncology

## 2021-08-22 VITALS — BP 120/53 | HR 57 | Temp 97.9°F | Resp 18 | Ht 69.0 in | Wt 173.4 lb

## 2021-08-22 DIAGNOSIS — Z171 Estrogen receptor negative status [ER-]: Secondary | ICD-10-CM | POA: Insufficient documentation

## 2021-08-22 DIAGNOSIS — C50411 Malignant neoplasm of upper-outer quadrant of right female breast: Secondary | ICD-10-CM | POA: Diagnosis not present

## 2021-08-22 DIAGNOSIS — Z5112 Encounter for antineoplastic immunotherapy: Secondary | ICD-10-CM | POA: Diagnosis not present

## 2021-08-22 DIAGNOSIS — C787 Secondary malignant neoplasm of liver and intrahepatic bile duct: Secondary | ICD-10-CM | POA: Insufficient documentation

## 2021-08-22 DIAGNOSIS — C50811 Malignant neoplasm of overlapping sites of right female breast: Secondary | ICD-10-CM | POA: Insufficient documentation

## 2021-08-22 DIAGNOSIS — R5383 Other fatigue: Secondary | ICD-10-CM | POA: Diagnosis not present

## 2021-08-22 DIAGNOSIS — Z452 Encounter for adjustment and management of vascular access device: Secondary | ICD-10-CM | POA: Diagnosis not present

## 2021-08-22 DIAGNOSIS — D6481 Anemia due to antineoplastic chemotherapy: Secondary | ICD-10-CM | POA: Diagnosis not present

## 2021-08-22 MED ORDER — DEXAMETHASONE 4 MG PO TABS: 4.0000 mg | ORAL_TABLET | Freq: Every day | ORAL | 1 refills | Status: DC

## 2021-08-22 MED ORDER — LIDOCAINE-PRILOCAINE 2.5-2.5 % EX CREA: TOPICAL_CREAM | CUTANEOUS | 3 refills | Status: DC

## 2021-08-22 NOTE — Assessment & Plan Note (Signed)
07/19/2014: Right breast cancer grade 2 IDC ER/PR negative HER2 positive Ki-67 90% treated with neoadjuvant chemo with TCHP x6 cycles followed by Herceptin maintenance, right lumpectomy had residual disease 4 cm 1/3 lymph node positive, ER/PR negative, adjuvant radiation and capecitabine completed 09/27/2015 12/06/2015: Recurrence: Right subpectoral lymph node and subcutaneous nodule, liver mass (biopsy adenocarcinoma consistent with upper GI or pancreaticobiliary primary, HER2 negative, ER negative) July 2019: Metastatic disease: TDM 1 followed by Herceptin maintenance,pertuzumabadded 10/06/2018, Abraxane 12/31/2017-09/01/2018, Herceptin and pertuzumab maintenance  Priortreatment: Enhertu started 9/15/2022switched to Herceptin maintenance 02/19/2021 Herceptintoxicities:no adverse effects  02/04/2021: PET CT scan: Complete metabolic response, no evidence of hypermetabolic disease in the liver (mass measured 2.7 cm previously is now 1.5 cm with no uptake) 06/08/2021: PET CT scan: Liver metastases. Right hepatic lobe mass measures 6 cm (previously it was 1.5 cm), medial to this there is a 1 cm lesion with SUV of 5.3, mild right subpectoral lymph node hypermetabolism, sigmoid focal hypermetabolism is nonspecific ------------------------------------------------------------------------------------------------------------------------------------------- Current treatment: Enhertu lower dosage 06/20/2021 completed 3 cycles  Enhertu toxicities: 1. Chemo-induced anemia: Monitoring 2. Mild leukopenia: Okay to treat  08/20/2021: CT CAP: Increased size of the metastatic liver lesions went from 6 cm to 7.5 cm and hepatic dome lesion is now 17 mm previously 10 mm, stable size of the right subpectoral lymph node, no new sites of metastatic disease, stable upper abdominal lymph nodes probably benign  Component Ref Range & Units 5 d ago 3 wk ago 1 mo ago 2 mo ago 3 mo ago 5 mo ago 6 mo ago  CEA ([facility])  0.00 - 5.00 ng/mL 673.06High  468.44High CM  308.74High CM  173.00High CM  49.10High CM  3.15 CM  1.71 CM    Based on progression of the CEA and progression noted on the scans, recommend switching treatment Before figuring out change in treatment, I would like to biopsy of the liver for additional diagnostic studies and molecular testing.

## 2021-08-22 NOTE — Progress Notes (Signed)
Patient Care Team: Isaac Bliss, Rayford Halsted, MD as PCP - General (Internal Medicine) Larey Dresser, MD as PCP - Advanced Heart Failure (Cardiology) Stark Klein, MD as Consulting Physician (General Surgery) Jacelyn Pi, MD as Consulting Physician (Endocrinology) [name], MD as Consulting Physician (Plastic Surgery) Gery Pray, MD as Consulting Physician (Radiation Oncology) Juanita Craver, MD as Consulting Physician (Gastroenterology) Alda Berthold, DO as Consulting Physician (Neurology) Starlyn Skeans, MD as Consulting Physician (Family Medicine) Sherlynn Stalls, MD as Consulting Physician (Ophthalmology) Salvadore Dom, MD as Consulting Physician (Obstetrics and Gynecology) Raina Mina, RPH-CPP (Pharmacist) Nicholas Lose, MD as Consulting Physician (Hematology and Oncology)  DIAGNOSIS:  Encounter Diagnosis  Name Primary?   Malignant neoplasm of upper-outer quadrant of right breast in female, estrogen receptor negative ([place])     SUMMARY OF ONCOLOGIC HISTORY: Oncology History  Breast cancer of upper-outer quadrant of right female breast ([place])  07/19/2014 Initial Biopsy   (R) breast needle biopsy (10:00): IDC, DCIS, grade 2. ER-, PR-, HER2+ (ratio 2.42). Ki67 90%.    07/22/2014 Initial Diagnosis   Breast cancer of upper-outer quadrant of right female breast   08/08/2014 - 11/21/2014 Neo-Adjuvant Chemotherapy   Taxotere/Carbo/Herceptin/Perjeta x 6 cycles.    12/12/2014 - 10/02/2015 Chemotherapy   Maintenance Herceptin (to complete 1 year of therapy).    12/27/2014 Surgery   (R) lumpectomy with SLNB [facility]): IDC, grade 2, spanning 4 cm, associated high grade DCIS. Negative margins. 1/3 SLN (+).   ER/PR repeated and remain negative.   ypT2, ypN1a: Stage IIB   01/06/2015 Surgery   Bilateral breast mammoplasty ([place]): Right breast path with intralymphatic emboli of ductal carcinoma. Left breast benign.    02/02/2015 Imaging   CT chest: No  findings of metastatic disease in the chest. Right axillary fluid density lesion likely postoperative seroma or hematoma. Small left subpectoral nodes warrant followup attention.   02/02/2015 Imaging   Bone scan: No scintigraphic evidence of osseous metastatic disease   02/16/2015 Surgery   (R) mastectomy and ALND [facility]): Scattered foci of high grade DCIS, scattered intralymphatic tumor emboli. Margins negative. 0/15 LNs.    04/05/2015 - 05/19/2015 Radiation Therapy   Adjuvant XRT ([place]). The right chest wall was treated to 50.4 Gy in 28 fractions at 1.8 Gy per fraction.  The right mastectomy scar was treated to 10 Gy in 5 fractions at 2 Gy per fraction.    12/06/2015 PET scan   Right subpectoral lymph node measuring 9 mm and exhibits malignant range FDG uptake, suspicious for metastatic adenopathy. Subcutaneous nodule within the upper left chest wall exhibits malignant range uptake, which is indeterminate; this is at the site of previous Port-A-Cath and is favored to represent postsurgical change. Indeterminate left adrenal nodules which exhibit mild uptake, favored to represent benign adenoma.   08/11/2017 - 11/03/2017 Chemotherapy   The patient had ado-trastuzumab emtansine (KADCYLA) 320 mg in sodium chloride 0.9 % 250 mL chemo infusion, 3.6 mg/kg = 320 mg, Intravenous, Once, 4 of 5 cycles Administration: 320 mg (08/12/2017), 320 mg (09/01/2017), 320 mg (09/23/2017)  for chemotherapy treatment.    11/18/2017 - 08/30/2020 Chemotherapy   The patient had trastuzumab (HERCEPTIN) 546 mg in sodium chloride 0.9 % 250 mL chemo infusion, 6 mg/kg = 546 mg (100 % of original dose 6 mg/kg), Intravenous,  Once, 2 of 2 cycles Dose modification: 6 mg/kg (original dose 6 mg/kg, Cycle 1, Reason: Provider Judgment) Administration: 546 mg (11/18/2017), 546 mg (12/16/2017) pertuzumab (PERJETA) 420 mg in sodium chloride  0.9 % 250 mL chemo infusion, 420 mg (100 % of original dose 420 mg), Intravenous, Once, 15 of 15  cycles Dose modification: 420 mg (original dose 420 mg, Cycle 18), 420 mg (original dose 420 mg, Cycle 27) Administration: 420 mg (10/06/2018), 420 mg (10/27/2018), 420 mg (11/18/2018), 420 mg (12/08/2018), 420 mg (12/29/2018), 420 mg (01/19/2019), 420 mg (02/16/2019), 420 mg (03/16/2019), 420 mg (04/13/2019), 420 mg (06/08/2019), 420 mg (07/06/2019), 420 mg (08/03/2019), 420 mg (08/31/2019), 420 mg (09/28/2019), 420 mg (05/19/2019) trastuzumab-anns (KANJINTI) 546 mg in sodium chloride 0.9 % 250 mL chemo infusion, 6 mg/kg = 546 mg (100 % of original dose 6 mg/kg), Intravenous,  Once, 30 of 30 cycles Dose modification: 6 mg/kg (original dose 6 mg/kg, Cycle 3, Reason: Other (see comments), Comment: requested step therapy by [facility] insurance), 4 mg/kg (original dose 6 mg/kg, Cycle 9, Reason: Provider Judgment), 6 mg/kg (original dose 6 mg/kg, Cycle 17, Reason: Provider Judgment), 4 mg/kg (original dose 6 mg/kg, Cycle 27, Reason: Provider Judgment), 6 mg/kg (original dose 6 mg/kg, Cycle 27, Reason: Provider Judgment) Administration: 546 mg (01/13/2018), 546 mg (02/10/2018), 546 mg (03/03/2018), 546 mg (03/24/2018), 546 mg (04/14/2018), 546 mg (05/05/2018), 357 mg (05/26/2018), 357 mg (06/09/2018), 357 mg (06/23/2018), 357 mg (07/07/2018), 357 mg (07/21/2018), 357 mg (08/04/2018), 357 mg (08/18/2018), 357 mg (09/01/2018), 546 mg (09/15/2018), 546 mg (10/06/2018), 546 mg (10/27/2018), 546 mg (11/18/2018), 546 mg (12/08/2018), 546 mg (12/29/2018), 546 mg (01/19/2019), 546 mg (02/16/2019), 480 mg (03/16/2019), 483 mg (04/13/2019), 450 mg (06/08/2019), 450 mg (07/06/2019), 450 mg (08/03/2019), 450 mg (08/31/2019), 450 mg (09/28/2019)  for chemotherapy treatment.    12/31/2017 - 09/01/2018 Chemotherapy   The patient had PACLitaxel-protein bound (ABRAXANE) chemo infusion 175 mg, 80 mg/m2 = 175 mg (100 % of original dose 80 mg/m2), Intravenous,  Once, 5 of 10 cycles Dose modification: 80 mg/m2 (original dose 80 mg/m2, Cycle 11) Administration: 175 mg  (07/07/2018), 175 mg (07/21/2018), 175 mg (08/04/2018), 175 mg (08/18/2018), 175 mg (09/01/2018) PACLitaxel-protein bound (ABRAXANE) chemo infusion 200 mg, 100 mg/m2 = 200 mg, Intravenous, Once, 10 of 10 cycles Dose modification: 80 mg/m2 (original dose 100 mg/m2, Cycle 2, Reason: Provider Judgment), 80 mg/m2 (original dose 100 mg/m2, Cycle 2, Reason: Provider Judgment) Administration: 200 mg (12/31/2017), 200 mg (01/06/2018), 175 mg (01/20/2018), 175 mg (01/27/2018), 175 mg (02/10/2018), 175 mg (03/03/2018), 175 mg (03/10/2018), 175 mg (03/24/2018), 175 mg (04/07/2018), 175 mg (04/14/2018), 175 mg (04/28/2018), 175 mg (05/05/2018), 175 mg (05/26/2018), 175 mg (06/09/2018), 175 mg (05/19/2018)  for chemotherapy treatment.    10/05/2020 - 01/18/2021 Chemotherapy   Patient is on Treatment Plan : BREAST METASTATIC fam-trastuzumab deruxtecan-nxki (Enhertu) q21d     02/19/2021 - 05/14/2021 Chemotherapy   Patient is on Treatment Plan : BREAST Trastuzumab q21d x 13 cycles     06/20/2021 - 08/01/2021 Chemotherapy   Patient is on Treatment Plan : BREAST METASTATIC fam-trastuzumab deruxtecan-nxki (Enhertu) q21d     08/31/2021 -  Chemotherapy   Patient is on Treatment Plan : BREAST METASTATIC Sacituzumab govitecan-hziy Ivette Loyal) q21d     Metastasis to liver ([place])  08/04/2017 Initial Diagnosis   Metastasis to liver ([place])   08/12/2017 - 10/14/2017 Chemotherapy   The patient had ado-trastuzumab emtansine (KADCYLA) 320 mg in sodium chloride 0.9 % 250 mL chemo infusion, 3.6 mg/kg = 320 mg, Intravenous, Once, 4 of 5 cycles Administration: 320 mg (08/12/2017), 320 mg (09/01/2017), 320 mg (09/23/2017)  for chemotherapy treatment.    11/18/2017 - 08/30/2020 Chemotherapy   The patient had trastuzumab (  HERCEPTIN) 546 mg in sodium chloride 0.9 % 250 mL chemo infusion, 6 mg/kg = 546 mg (100 % of original dose 6 mg/kg), Intravenous,  Once, 2 of 2 cycles Dose modification: 6 mg/kg (original dose 6 mg/kg, Cycle 1, Reason: Provider  Judgment) Administration: 546 mg (11/18/2017), 546 mg (12/16/2017) pertuzumab (PERJETA) 420 mg in sodium chloride 0.9 % 250 mL chemo infusion, 420 mg (100 % of original dose 420 mg), Intravenous, Once, 15 of 15 cycles Dose modification: 420 mg (original dose 420 mg, Cycle 18), 420 mg (original dose 420 mg, Cycle 27) Administration: 420 mg (10/06/2018), 420 mg (10/27/2018), 420 mg (11/18/2018), 420 mg (12/08/2018), 420 mg (12/29/2018), 420 mg (01/19/2019), 420 mg (02/16/2019), 420 mg (03/16/2019), 420 mg (04/13/2019), 420 mg (06/08/2019), 420 mg (07/06/2019), 420 mg (08/03/2019), 420 mg (08/31/2019), 420 mg (09/28/2019), 420 mg (05/19/2019) trastuzumab-anns (KANJINTI) 546 mg in sodium chloride 0.9 % 250 mL chemo infusion, 6 mg/kg = 546 mg (100 % of original dose 6 mg/kg), Intravenous,  Once, 30 of 30 cycles Dose modification: 6 mg/kg (original dose 6 mg/kg, Cycle 3, Reason: Other (see comments), Comment: requested step therapy by [facility] insurance), 4 mg/kg (original dose 6 mg/kg, Cycle 9, Reason: Provider Judgment), 6 mg/kg (original dose 6 mg/kg, Cycle 17, Reason: Provider Judgment), 4 mg/kg (original dose 6 mg/kg, Cycle 27, Reason: Provider Judgment), 6 mg/kg (original dose 6 mg/kg, Cycle 27, Reason: Provider Judgment) Administration: 546 mg (01/13/2018), 546 mg (02/10/2018), 546 mg (03/03/2018), 546 mg (03/24/2018), 546 mg (04/14/2018), 546 mg (05/05/2018), 357 mg (05/26/2018), 357 mg (06/09/2018), 357 mg (06/23/2018), 357 mg (07/07/2018), 357 mg (07/21/2018), 357 mg (08/04/2018), 357 mg (08/18/2018), 357 mg (09/01/2018), 546 mg (09/15/2018), 546 mg (10/06/2018), 546 mg (10/27/2018), 546 mg (11/18/2018), 546 mg (12/08/2018), 546 mg (12/29/2018), 546 mg (01/19/2019), 546 mg (02/16/2019), 480 mg (03/16/2019), 483 mg (04/13/2019), 450 mg (06/08/2019), 450 mg (07/06/2019), 450 mg (08/03/2019), 450 mg (08/31/2019), 450 mg (09/28/2019)  for chemotherapy treatment.      CHIEF COMPLIANT: Follow-up to start chemotherapy with sacituzumab govitecan     INTERVAL HISTORY: Kambry Takacs is a 71 y.o. with above-mentioned history of HER-2 positive, estrogen receptor negative breast cancer, was on chemotherapy with Enhertu. She presents to the clinic today for follow-up.  She previously had chemotherapy-induced anemia and is concerned about worsening anemia with this treatment.  We will have to watch and monitor this closely.  She walks with the help of a cane.   ALLERGIES:  is allergic to nsaids and tape.  MEDICATIONS:  Current Outpatient Medications  Medication Sig Dispense Refill   Accu-Chek Softclix Lancets lancets 1 each by Other route 4 (four) times daily. Test blood sugars 4 x times daily 200 each 1   aspirin EC 81 MG tablet Take 1 tablet (81 mg total) by mouth daily. 90 tablet 3   Bempedoic Acid-Ezetimibe (NEXLIZET) 180-10 MG TABS Take 1 tablet by mouth daily. 90 tablet 3   Blood Glucose Calibration (ACCU-CHEK GUIDE CONTROL) LIQD 1 Bottle by In Vitro route as needed. 1 each 0   Blood Glucose Monitoring Suppl (ACCU-CHEK GUIDE) w/Device KIT 1 each by Does not apply route 4 (four) times daily. Use to test blood sugars 4x a day 1 kit 0   buPROPion (WELLBUTRIN SR) 200 MG 12 hr tablet Take one tablet by mouth once daily. 90 tablet 0   CALCIUM CITRATE-VITAMIN D PO Take 1 tablet by mouth 3 (three) times daily.      carvedilol (COREG) 6.25 MG tablet Take 1 tablet (  6.25 mg total) by mouth 2 (two) times daily. 180 tablet 3   cholecalciferol (VITAMIN D) 1000 units tablet Take 1,000 Units by mouth daily.     Continuous Blood Gluc Sensor (FREESTYLE LIBRE 2 SENSOR) MISC      Cyanocobalamin (VITAMIN B-12 PO) Take 1 tablet by mouth every 14 (fourteen) days.     diphenhydrAMINE (BENADRYL) 25 MG tablet Take 25 mg by mouth daily as needed for allergies or sleep.     glucose blood (ACCU-CHEK GUIDE) test strip Use Accu Chek test strips to check blood sugar 4 times daily. DX:E11.21 400 each 0   insulin aspart (NOVOLOG) 100 UNIT/ML injection Inject  into the skin daily as needed for high blood sugar. Per patient, sliding scale with meals as needed     insulin glargine (LANTUS SOLOSTAR) 100 UNIT/ML Solostar Pen Inject 8 Units into the skin daily. 7 mL 3   KERENDIA 20 MG TABS Take 1 tablet by mouth daily. 30 tablet 6   lidocaine-prilocaine (EMLA) cream Apply to affected area once 30 g 3   loratadine (CLARITIN) 10 MG tablet Take 10 mg by mouth as needed for allergies.     melatonin 5 MG TABS Take 5 mg by mouth at bedtime.     omeprazole (PRILOSEC) 40 MG capsule Take 1 capsule (40 mg total) by mouth daily. 30 capsule 1   Pediatric Multivitamins-Iron (FLINTSTONES PLUS IRON) chewable tablet Chew 1 tablet by mouth 2 (two) times daily.     polyethylene glycol (MIRALAX / GLYCOLAX) 17 g packet Take 17 g by mouth daily.     Probiotic Product ([employer] PROBIOTIC PO) Take 1 capsule by mouth daily.      Propylene Glycol (SYSTANE BALANCE OP) Place 1-2 drops into both eyes 3 (three) times daily as needed (for dry eyes).      SURE COMFORT PEN NEEDLES 32G X 4 MM MISC Use as instructed to inject insulin and Victoza daily. 200 each 0   tobramycin (TOBREX) 0.3 % ophthalmic ointment Place 1 application into both eyes as needed (Takes day before eye injections).     vortioxetine HBr (TRINTELLIX) 20 MG TABS tablet Take 1 tablet (20 mg total) by mouth at bedtime. 90 tablet 0   No current facility-administered medications for this visit.    PHYSICAL EXAMINATION: ECOG PERFORMANCE STATUS: 1 - Symptomatic but completely ambulatory  Vitals:   08/31/21 0847  BP: (!) 126/59  Pulse: 64  Resp: 18  Temp: (!) 97.5 F (36.4 C)  SpO2: 95%   Filed Weights   08/31/21 0847  Weight: 177 lb 12.8 oz (80.6 kg)      LABORATORY DATA:  I have reviewed the data as listed    Latest Ref Rng & Units 08/30/2021   11:30 AM 08/17/2021   10:40 AM 07/31/2021    2:19 PM  CMP  Glucose 70 - 99 mg/dL 90  199  114   BUN 8 - 23 mg/dL 45  47  47   Creatinine 0.44 - 1.00 mg/dL 1.89  2.13   2.15   Sodium 135 - 145 mmol/L 142  141  140   Potassium 3.5 - 5.1 mmol/L 4.2  4.5  4.8   Chloride 98 - 111 mmol/L 111  109  107   CO2 22 - 32 mmol/L _0 Calcium 8.9 - 10.3 mg/dL 8.9  9.0  8.8   Total Protein 6.5 - 8.1 g/dL 6.2  6.4  6.1   Total Bilirubin  0.3 - 1.2 mg/dL 0.7  0.4  0.4   Alkaline Phos 38 - 126 U/L 51  56  51   AST 15 - 41 U/L _0 ALT 0 - 44 U/L _1 Lab Results  Component Value Date   WBC 5.0 08/30/2021   HGB 10.3 (L) 08/30/2021   HCT 31.3 (L) 08/30/2021   MCV 101.3 (H) 08/30/2021   PLT 164 08/30/2021   NEUTROABS 3.4 08/30/2021    ASSESSMENT & PLAN:  Breast cancer of upper-outer quadrant of right female breast ([place]) 07/19/2014: Right breast cancer grade 2 IDC ER/PR negative HER2 positive Ki-67 90% treated with neoadjuvant chemo with TCHP x6 cycles followed by Herceptin maintenance, right lumpectomy had residual disease 4 cm 1/3 lymph node positive, ER/PR negative, adjuvant radiation and capecitabine completed 09/27/2015 12/06/2015: Recurrence: Right subpectoral lymph node and subcutaneous nodule, liver mass (biopsy adenocarcinoma consistent with upper GI or pancreaticobiliary primary, HER2 negative, ER negative) July 2019: Metastatic disease: TDM 1 followed by Herceptin maintenance, pertuzumab added 10/06/2018, Abraxane 12/31/2017-09/01/2018, Herceptin and pertuzumab maintenance   Prior treatment: Enhertu started 10/05/2020 switched to Herceptin maintenance 02/19/2021 switched to Enhertu at higher dosage x 3 cycles discontinued 08/01/2021    02/04/2021: PET CT scan: Complete metabolic response, no evidence of hypermetabolic disease in the liver (mass measured 2.7 cm previously is now 1.5 cm with no uptake)   08/20/2021: CT CAP: Increased size of the metastatic liver lesions went from 6 cm to 7.5 cm and hepatic dome lesion is now 17 mm previously 10 mm, stable size of the right subpectoral lymph node, no new sites of metastatic disease, stable upper  abdominal lymph nodes probably benign  ------------------------------------------------------------------------------------------------------------------------------------------- Current treatment: Ivette Loyal cycle 1 day 1 Liver biopsy was performed 08/30/2021 Molecular testing will need to be performed  Return to clinic in 1 week for cycle 1 day 8 and toxicity check    No orders of the defined types were placed in this encounter.  The patient has a good understanding of the overall plan. she agrees with it. she will call with any problems that may develop before the next visit here. Total time spent: 30 mins including face to face time and time spent for planning, charting and co-ordination of care   Harriette Ohara, MD 08/31/21    I Gardiner Coins am scribing for Dr. Lindi Adie  I have reviewed the above documentation for accuracy and completeness, and I agree with the above.

## 2021-08-22 NOTE — Progress Notes (Unsigned)
Suttle, Dylan J, MD  Danile Trier D Approved for ultrasound guided liver mass biopsy.    Dylan     

## 2021-08-22 NOTE — Progress Notes (Signed)
DISCONTINUE ON PATHWAY REGIMEN - Breast     A cycle is every 21 days:     Fam-trastuzumab deruxtecan-nxki   **Always confirm dose/schedule in your pharmacy ordering system**  REASON: Disease Progression PRIOR TREATMENT: [id number]: Fam-trastuzumab Deruxtecan 5.4 mg/kg q21 Days TREATMENT RESPONSE: Progressive Disease (PD)  START ON PATHWAY REGIMEN - Breast     A cycle is every 21 days:     Sacituzumab govitecan-hziy   **Always confirm dose/schedule in your pharmacy ordering system**  Patient Characteristics: Distant Metastases or Locoregional Recurrent Disease - Unresected or Locally Advanced Unresectable Disease Progressing after Neoadjuvant and Local Therapies, ER Negative/Unknown, Chemotherapy, HER2 Negative/Unknown, Third Line and Beyond, Prior or  Contraindicated Anthracycline and Prior or Contraindicated Eribulin Therapeutic Status: Distant Metastases HER2 Status: Negative (-) ER Status: Negative (-) PR Status: Negative (-) Therapy Approach Indicated: Standard Chemotherapy/Endocrine Therapy Line of Therapy: Third Line and Beyond Intent of Therapy: Non-Curative / Palliative Intent, Discussed with Patient

## 2021-08-23 ENCOUNTER — Encounter: Payer: Self-pay | Admitting: Hematology and Oncology

## 2021-08-23 NOTE — Therapy (Deleted)
OUTPATIENT PHYSICAL THERAPY TREATMENT NOTE   Patient Name: Darlene [name] MRN: [id number] DOB:06-Nov-1950, 71 y.o., female Today's Date: 08/23/2021  PCP: Isaac Bliss, Rayford Halsted, MD REFERRING PROVIDER: Thornton Park, MD  END OF SESSION:    Past Medical History:  Diagnosis Date   Anemia    hx of - during 1st round chemo   Anxiety    Arthritis    in fingers, shoulders   Back pain    Balance problem    Breast cancer ([place])    Breast cancer of upper-outer quadrant of right female breast ([place]) 07/22/2014   Bunion, right foot    CHF (congestive heart failure) ([place])    Chronic kidney disease    creatininei levels high per pt    Clumsiness    Constipation    Depression    Diabetes mellitus without complication ([place])    69+ years type 2    Diabetic retinopathy ([place])    torn retina   Eating disorder    binge eating   Epistaxis 08/26/2014   Fatty liver    Foot drop, left foot    HCAP (healthcare-associated pneumonia) 12/18/2014   Heart murmur    never had any problems   History of radiation therapy 04/05/15-05/19/15   right chest wall was treated to 50.4 Gy in 28 fractions, right mastectomy scar was treated to 10 Gy in 5 fractions   Hyperlipidemia    Hypertension    Joint pain    Multiple food allergies    Neuromuscular disorder ([place])    diabetic neuropathy   Obesity    Obstructive sleep apnea on CPAP    does not use cpap all the time has lost 160 lbs    Ovarian cyst rupture    possible   Pneumonia    Pneumonia 11/2014   Radiation 04/05/15-05/19/15   right chest wall 50.4 Gy, mastectomy scar 10 Gy   Sleep apnea    Thyroid nodule 06/2014   Past Surgical History:  Procedure Laterality Date   ANAL RECTAL MANOMETRY N/A 08/01/2021   Procedure: ANO RECTAL MANOMETRY;  Surgeon: Thornton Park, MD;  [facility];  Service: Gastroenterology;  Laterality: N/A;   APPENDECTOMY     BIOPSY THYROID Bilateral 06/2014   2 nodules - Benign    BREAST LUMPECTOMY  WITH RADIOACTIVE SEED AND SENTINEL LYMPH NODE BIOPSY Right 12/27/2014   Procedure: BREAST LUMPECTOMY WITH RADIOACTIVE SEED AND SENTINEL LYMPH NODE BIOPSY;  Surgeon: Stark Klein, MD;  Location: [facility];  Service: General;  Laterality: Right;   BREAST REDUCTION SURGERY Bilateral 01/06/2015   Procedure: BILATERAL BREAST REDUCTION, RIGHT ONCOPLASTIC RECONSTRUCTION),LEFT BREAST REDUCTION FOR SYMMETRY;  Surgeon: [name], MD;  Location: [facility];  Service: Plastics;  Laterality: Bilateral;   BREATH TEK H PYLORI N/A 09/15/2012   Procedure: BREATH TEK H PYLORI;  Surgeon: Pedro Earls, MD;  [facility];  Service: General;  Laterality: N/A;   BUNIONECTOMY Left 12/2013   CARPAL TUNNEL RELEASE Right    CARPAL TUNNEL RELEASE Left    CATARACT EXTRACTION     6/19   COLONOSCOPY     DUPUYTREN CONTRACTURE RELEASE     GASTRIC ROUX-EN-Y N/A 11/02/2012   Procedure: LAPAROSCOPIC ROUX-EN-Y GASTRIC;  Surgeon: Pedro Earls, MD;  [facility];  Service: General;  Laterality: N/A;   IR GENERIC HISTORICAL  03/18/2016   IR US GUIDE VASC ACCESS LEFT 03/18/2016 Markus Daft, MD [facility]   IR GENERIC HISTORICAL  03/18/2016  IR FLUORO GUIDE CV LINE LEFT 03/18/2016 Markus Daft, MD [facility]   LAPAROSCOPIC APPENDECTOMY N/A 07/22/2015   Procedure: APPENDECTOMY LAPAROSCOPIC;  Surgeon: Michael Boston, MD;  [facility];  Service: General;  Laterality: N/A;   MASTECTOMY Right 02/15/2015   modified   MODIFIED MASTECTOMY Right 02/15/2015   Procedure: RIGHT MODIFIED RADICAL MASTECTOMY;  Surgeon: Stark Klein, MD;  Location: [facility];  Service: General;  Laterality: Right;   ORIF WRIST FRACTURE Left 05/11/2019   Procedure: OPEN REDUCTION INTERNAL FIXATION (ORIF) left distal radius fracture;  Surgeon: Renette Butters, MD;  [facility];  Service: Orthopedics;  Laterality: Left;  with block   PORT-A-CATH REMOVAL Left 10/10/2015   Procedure: PORT REMOVAL;  Surgeon: Stark Klein, MD;   Location: [facility];  Service: General;  Laterality: Left;   PORTACATH PLACEMENT Left 08/02/2014   Procedure: INSERTION PORT-A-CATH;  Surgeon: Stark Klein, MD;  Location: [facility];  Service: General;  Laterality: Left;   PORTACATH PLACEMENT N/A 11/05/2016   Procedure: INSERTION PORT-A-CATH;  Surgeon: Stark Klein, MD;  Location: [facility];  Service: General;  Laterality: N/A;   RETINAL LASER PROCEDURE     retina tear on left eye   ROTATOR CUFF REPAIR Right 2005   SCAR REVISION Right 12/08/2015   Procedure: COMPLEX REPAIR RIGHT CHEST 10-15CM;  Surgeon: [name], MD;  Location: [facility];  Service: Plastics;  Laterality: Right;   TOE SURGERY  1970s   bone spur   TRIGGER FINGER RELEASE     x3   Patient Active Problem List   Diagnosis Date Noted   Constipation due to outlet dysfunction    Stress 07/31/2021   Abnormal findings on diagnostic imaging of other parts of digestive tract 02/09/2021   Allergic rhinitis 02/09/2021   Arthritis of right knee 02/09/2021   Diverticular disease of colon 02/09/2021   Gallstones 02/09/2021   Hyperglycemia due to type 2 diabetes mellitus ([place]) 02/09/2021   Neuropathy 02/09/2021   Pure hypercholesterolemia 02/09/2021   Shoulder joint pain 02/09/2021   Thyroid nodule 02/09/2021   Vitamin B12 deficiency 02/09/2021   CKD (chronic kidney disease) stage 4, GFR 15-29 ml/min (HCC) 09/27/2020   Hammer toe of second toe of right foot 11/16/2018   Hallux valgus of right foot 11/16/2018   Essential hypertension 11/04/2018   Pain due to onychomycosis of toenails of both feet 10/14/2018   Pre-ulcerative calluses 10/14/2018   Diabetic retinal edema ([place]) 07/19/2018   Diabetic retinopathy of both eyes associated with type 2 diabetes mellitus ([place]) 06/28/2018   Goals of care, counseling/discussion 12/30/2017   Occult malignancy ([place]) 12/01/2017   Neoplastic malignant related fatigue 09/29/2017   Metastasis to liver ([place])  08/04/2017   Lesion of liver 07/30/2017   Type 2 diabetes mellitus without complication, with long-term current use of insulin ([place]) 03/11/2017   Vitamin D deficiency 02/24/2017   Aortic atherosclerosis ([place]) 01/28/2017   Type 2 diabetes mellitus without complication, without long-term current use of insulin ([place]) 12/18/2016   Class 1 obesity with serious comorbidity and body mass index (BMI) of 30.0 to 30.9 in adult 12/18/2016   Port catheter in place 08/21/2015   Acute appendicitis 07/22/2015   Appendicitis with peritonitis 07/22/2015   Acquired absence of right breast and nipple 07/05/2015   History of therapeutic radiation 07/05/2015   Recurrent breast cancer, right ([place]) 02/15/2015   Chemotherapy-induced peripheral neuropathy ([place]) 02/08/2015   Macromastia 01/06/2015   Anemia in neoplastic disease 12/21/2014   Cough  Breast cancer of upper-outer quadrant of right female breast ([place]) 07/22/2014   OSA on CPAP 06/02/2013   Type 2 diabetes mellitus with diabetic neuropathy, with long-term current use of insulin ([place]) 06/02/2013   Lap Roux Y Gastric Bypass Oct 2014 11/03/2012   Depression 09/10/2012   hyperlipidemia 09/10/2012    REFERRING DIAG: K59.00 (ICD-10-CM) - Constipation, unspecified constipation type  THERAPY DIAG:  No diagnosis found.  Rationale for Evaluation and Treatment Rehabilitation  PERTINENT HISTORY: Breast cancer that has gone to the liver  PRECAUTIONS: Cancer  SUBJECTIVE: ***  PAIN:  Are you having pain? {[id number]}   OBJECTIVE: (objective measures completed at initial evaluation unless otherwise dated)   (Copy Eval's Objective through Plan section here)   Charlottie Peragine, PT 08/23/2021, 1:11 PM

## 2021-08-24 ENCOUNTER — Ambulatory Visit: Payer: Medicare PPO | Attending: Gastroenterology | Admitting: Physical Therapy

## 2021-08-24 ENCOUNTER — Encounter: Payer: Self-pay | Admitting: Physical Therapy

## 2021-08-24 ENCOUNTER — Other Ambulatory Visit: Payer: Self-pay | Admitting: *Deleted

## 2021-08-24 ENCOUNTER — Other Ambulatory Visit: Payer: Self-pay

## 2021-08-24 ENCOUNTER — Encounter: Payer: Self-pay | Admitting: *Deleted

## 2021-08-24 DIAGNOSIS — R278 Other lack of coordination: Secondary | ICD-10-CM | POA: Insufficient documentation

## 2021-08-24 DIAGNOSIS — M6281 Muscle weakness (generalized): Secondary | ICD-10-CM | POA: Diagnosis not present

## 2021-08-24 DIAGNOSIS — K5900 Constipation, unspecified: Secondary | ICD-10-CM | POA: Diagnosis not present

## 2021-08-24 NOTE — Progress Notes (Signed)
Per MD due to pt FSBS at home resulting in the 400's, pt to discontinue home dose of oral dexamethasone.  Pt educated and verbalized understanding.

## 2021-08-24 NOTE — Progress Notes (Signed)
Pharmacist Chemotherapy Monitoring - Initial Assessment    Anticipated start date: 08/31/21   The following has been reviewed per standard work regarding the patient's treatment regimen: The patient's diagnosis, treatment plan and drug doses, and organ/hematologic function Lab orders and baseline tests specific to treatment regimen  The treatment plan start date, drug sequencing, and pre-medications Prior authorization status  Patient's documented medication list, including drug-drug interaction screen and prescriptions for anti-emetics and supportive care specific to the treatment regimen The drug concentrations, fluid compatibility, administration routes, and timing of the medications to be used The patient's access for treatment and lifetime cumulative dose history, if applicable  The patient's medication allergies and previous infusion related reactions, if applicable   Changes made to treatment plan:  N/A  Follow up needed:  Pending authorization for treatment    Larene Beach, [place], 08/24/2021  8:23 AM

## 2021-08-24 NOTE — Therapy (Signed)
OUTPATIENT PHYSICAL THERAPY FEMALE PELVIC EVALUATION   Patient Name: Darlene [name] MRN: [id number] DOB:1950/11/26, 71 y.o., female Today's Date: 08/24/2021   PT End of Session - 08/24/21 1015     Visit Number 1    Date for PT Re-Evaluation 11/16/21    Authorization Type [employer]    PT Start Time 0930    PT Stop Time 6063    PT Time Calculation (min) 45 min    Activity Tolerance Patient tolerated treatment well    Behavior During Therapy WFL for tasks assessed/performed             Past Medical History:  Diagnosis Date   Anemia    hx of - during 1st round chemo   Anxiety    Arthritis    in fingers, shoulders   Back pain    Balance problem    Breast cancer ([place])    Breast cancer of upper-outer quadrant of right female breast ([place]) 07/22/2014   Bunion, right foot    CHF (congestive heart failure) ([facility])    Chronic kidney disease    creatininei levels high per pt    Clumsiness    Constipation    Depression    Diabetes mellitus without complication ([place])    01+ years type 2    Diabetic retinopathy ([place])    torn retina   Eating disorder    binge eating   Epistaxis 08/26/2014   Fatty liver    Foot drop, left foot    HCAP (healthcare-associated pneumonia) 12/18/2014   Heart murmur    never had any problems   History of radiation therapy 04/05/15-05/19/15   right chest wall was treated to 50.4 Gy in 28 fractions, right mastectomy scar was treated to 10 Gy in 5 fractions   Hyperlipidemia    Hypertension    Joint pain    Multiple food allergies    Neuromuscular disorder ([place])    diabetic neuropathy   Obesity    Obstructive sleep apnea on CPAP    does not use cpap all the time has lost 160 lbs    Ovarian cyst rupture    possible   Pneumonia    Pneumonia 11/2014   Radiation 04/05/15-05/19/15   right chest wall 50.4 Gy, mastectomy scar 10 Gy   Sleep apnea    Thyroid nodule 06/2014   Past Surgical History:  Procedure Laterality Date   ANAL RECTAL MANOMETRY N/A  08/01/2021   Procedure: ANO RECTAL MANOMETRY;  Surgeon: Thornton Park, MD;  [facility];  Service: Gastroenterology;  Laterality: N/A;   APPENDECTOMY     BIOPSY THYROID Bilateral 06/2014   2 nodules - Benign    BREAST LUMPECTOMY WITH RADIOACTIVE SEED AND SENTINEL LYMPH NODE BIOPSY Right 12/27/2014   Procedure: BREAST LUMPECTOMY WITH RADIOACTIVE SEED AND SENTINEL LYMPH NODE BIOPSY;  Surgeon: Stark Klein, MD;  Location: [facility];  Service: General;  Laterality: Right;   BREAST REDUCTION SURGERY Bilateral 01/06/2015   Procedure: BILATERAL BREAST REDUCTION, RIGHT ONCOPLASTIC RECONSTRUCTION),LEFT BREAST REDUCTION FOR SYMMETRY;  Surgeon: [name], MD;  Location: [facility];  Service: Plastics;  Laterality: Bilateral;   BREATH TEK H PYLORI N/A 09/15/2012   Procedure: BREATH TEK H PYLORI;  Surgeon: Pedro Earls, MD;  [facility];  Service: General;  Laterality: N/A;   BUNIONECTOMY Left 12/2013   CARPAL TUNNEL RELEASE Right    CARPAL TUNNEL RELEASE Left    CATARACT EXTRACTION     6/19   COLONOSCOPY  DUPUYTREN CONTRACTURE RELEASE     GASTRIC ROUX-EN-Y N/A 11/02/2012   Procedure: LAPAROSCOPIC ROUX-EN-Y GASTRIC;  Surgeon: Pedro Earls, MD;  [facility];  Service: General;  Laterality: N/A;   IR GENERIC HISTORICAL  03/18/2016   IR US GUIDE VASC ACCESS LEFT 03/18/2016 Markus Daft, MD [facility]   IR GENERIC HISTORICAL  03/18/2016   IR FLUORO GUIDE CV LINE LEFT 03/18/2016 Markus Daft, MD [facility]   LAPAROSCOPIC APPENDECTOMY N/A 07/22/2015   Procedure: APPENDECTOMY LAPAROSCOPIC;  Surgeon: Michael Boston, MD;  [facility];  Service: General;  Laterality: N/A;   MASTECTOMY Right 02/15/2015   modified   MODIFIED MASTECTOMY Right 02/15/2015   Procedure: RIGHT MODIFIED RADICAL MASTECTOMY;  Surgeon: Stark Klein, MD;  Location: [facility];  Service: General;  Laterality: Right;   ORIF WRIST FRACTURE Left 05/11/2019   Procedure: OPEN REDUCTION INTERNAL  FIXATION (ORIF) left distal radius fracture;  Surgeon: Renette Butters, MD;  [facility];  Service: Orthopedics;  Laterality: Left;  with block   PORT-A-CATH REMOVAL Left 10/10/2015   Procedure: PORT REMOVAL;  Surgeon: Stark Klein, MD;  Location: [facility];  Service: General;  Laterality: Left;   PORTACATH PLACEMENT Left 08/02/2014   Procedure: INSERTION PORT-A-CATH;  Surgeon: Stark Klein, MD;  Location: [facility];  Service: General;  Laterality: Left;   PORTACATH PLACEMENT N/A 11/05/2016   Procedure: INSERTION PORT-A-CATH;  Surgeon: Stark Klein, MD;  Location: [facility];  Service: General;  Laterality: N/A;   RETINAL LASER PROCEDURE     retina tear on left eye   ROTATOR CUFF REPAIR Right 2005   SCAR REVISION Right 12/08/2015   Procedure: COMPLEX REPAIR RIGHT CHEST 10-15CM;  Surgeon: [name], MD;  Location: [facility];  Service: Plastics;  Laterality: Right;   TOE SURGERY  1970s   bone spur   TRIGGER FINGER RELEASE     x3   Patient Active Problem List   Diagnosis Date Noted   Constipation due to outlet dysfunction    Stress 07/31/2021   Abnormal findings on diagnostic imaging of other parts of digestive tract 02/09/2021   Allergic rhinitis 02/09/2021   Arthritis of right knee 02/09/2021   Diverticular disease of colon 02/09/2021   Gallstones 02/09/2021   Hyperglycemia due to type 2 diabetes mellitus ([place]) 02/09/2021   Neuropathy 02/09/2021   Pure hypercholesterolemia 02/09/2021   Shoulder joint pain 02/09/2021   Thyroid nodule 02/09/2021   Vitamin B12 deficiency 02/09/2021   CKD (chronic kidney disease) stage 4, GFR 15-29 ml/min (HCC) 09/27/2020   Hammer toe of second toe of right foot 11/16/2018   Hallux valgus of right foot 11/16/2018   Essential hypertension 11/04/2018   Pain due to onychomycosis of toenails of both feet 10/14/2018   Pre-ulcerative calluses 10/14/2018   Diabetic retinal edema ([place]) 07/19/2018   Diabetic retinopathy of  both eyes associated with type 2 diabetes mellitus ([place]) 06/28/2018   Goals of care, counseling/discussion 12/30/2017   Occult malignancy ([place]) 12/01/2017   Neoplastic malignant related fatigue 09/29/2017   Metastasis to liver ([place]) 08/04/2017   Lesion of liver 07/30/2017   Type 2 diabetes mellitus without complication, with long-term current use of insulin ([place]) 03/11/2017   Vitamin D deficiency 02/24/2017   Aortic atherosclerosis ([place]) 01/28/2017   Type 2 diabetes mellitus without complication, without long-term current use of insulin ([place]) 12/18/2016   Class 1 obesity with serious comorbidity and body mass index (BMI) of 30.0 to 30.9 in adult 12/18/2016  Port catheter in place 08/21/2015   Acute appendicitis 07/22/2015   Appendicitis with peritonitis 07/22/2015   Acquired absence of right breast and nipple 07/05/2015   History of therapeutic radiation 07/05/2015   Recurrent breast cancer, right ([place]) 02/15/2015   Chemotherapy-induced peripheral neuropathy ([place]) 02/08/2015   Macromastia 01/06/2015   Anemia in neoplastic disease 12/21/2014   Cough    Breast cancer of upper-outer quadrant of right female breast ([place]) 07/22/2014   OSA on CPAP 06/02/2013   Type 2 diabetes mellitus with diabetic neuropathy, with long-term current use of insulin ([place]) 06/02/2013   Lap Roux Y Gastric Bypass Oct 2014 11/03/2012   Depression 09/10/2012   hyperlipidemia 09/10/2012    PCP: Isaac Bliss, Rayford Halsted, MD  REFERRING PROVIDER: Thornton Park, MD  REFERRING DIAG: K59.00 (ICD-10-CM) - Constipation, unspecified constipation type  THERAPY DIAG:  Muscle weakness (generalized)  Other lack of coordination  Constipation, unspecified constipation type  Rationale for Evaluation and Treatment Rehabilitation  ONSET DATE: 11/2021  SUBJECTIVE:                                                                                                                                                                                            SUBJECTIVE STATEMENT: Patient was taking a medication that increased her constipation. Patient will have to start a new medication that will cause constipation. Patient had PT 1x per week at [facility].  Fluid intake: Yes: water     PAIN:  Are you having pain? Yes NPRS scale: 5/10 Pain location: Anal  Pain type: dull Pain description: intermittent   Aggravating factors: bowel movement when hard to get out Relieving factors: having a bowel movement  PRECAUTIONS: Other: Metastatic Breast Cancer  with history of radiation therapy 04/05/15-05/19/15;   WEIGHT BEARING RESTRICTIONS No  FALLS:  Has patient fallen in last 6 months? No  LIVING ENVIRONMENT: Lives with: lives with their spouse   OCCUPATION: retired  PLOF: Independent  PATIENT GOALS have a regular bowel movement  PERTINENT HISTORY:  Metastatic breast cancer; diabetes, stage 4 kidney  disease, GASTRIC ROUX-EN-Y  BOWEL MOVEMENT Pain with bowel movement: Yes, when her stool does not move through the rectum Type of bowel movement:Type (Bristol Stool Scale) Type 4 recently, Frequency daily/2-3 times per day, Strain Yes, and Splinting she used to but not anymore ; sometimes strains Fully empty rectum: No Leakage: No Pads: No Fiber supplement: miralax URINATION Pain with urination: No Fully empty bladder: Yes:   Stream: Strong Urgency: No Frequency: average Leakage:  none Pads: No    OBJECTIVE:   DIAGNOSTIC FINDINGS:  Anal rectal manometry shows normal internal and external  anal sphincter pressures, rectal hyposensitivity likely due to chronic constipation, and evidence for dyssynergic defecation.   PATIENT SURVEYS:  PFIQ-7 22; CRAIQ-7 29  COGNITION:  Overall cognitive status: Within functional limits for tasks assessed     SENSATION:  Light touch: Appears intact  Proprioception: Appears intact   GAIT: Assistive device utilized: Quad cane small base Level of  assistance: Complete Independence                POSTURE: No Significant postural limitations   PELVIC ALIGNMENT:  LUMBARAROM/PROM  A/PROM A/PROM  eval  Flexion Decreased by 25%  Extension Decreased by 25%  Right lateral flexion Decreased by 25%  Left lateral flexion Decreased by 25%  Right rotation Decreased by 25%  Left rotation Decreased by 25%   (Blank rows = not tested)  LOWER EXTREMITY ROM:  Passive ROM Right eval Left eval  Hip flexion 110 110  Hip internal rotation 5 10  Hip external rotation 45 50   (Blank rows = not tested)     PALPATION:   General  after bowel movement she has to wipe many times.                 External Perineal Exam stool located on the outside of the rectum                             Internal Pelvic Floor no tightness or tenderness  Patient confirms identification and approves PT to assess internal pelvic floor and treatment Yes  PELVIC MMT:   MMT eval  Internal Anal Sphincter 3/5  External Anal Sphincter 4/5  Puborectalis 4/5  (Blank rows = not tested)        TODAY'S TREATMENT  EVAL finished the evaluation. Gave patient information on squatty potty   PATIENT EDUCATION:  08/24/2021 Education details: educated patient on squatty potty Person educated: Patient Education method: Explanation Education comprehension: verbalized understanding   HOME EXERCISE PROGRAM: See above.   ASSESSMENT:  CLINICAL IMPRESSION: Patient is a 71 y.o. female who was seen today for physical therapy evaluation and treatment for constipation. Patient has had constipation since 11/2020. She was placed on a new medication that increased the constipation. She has pain at level 5/10 when she is having trouble with the stool coming out. Patient will have to strain at times with a bowel movement. She had stool on the outside of the anus when the therapist was assessing her rectal strength. Patient reports she has to wipe many times due to stool on the  outside of there rectum after a bowel movement. Puborectalis strength is 4/5, external anal sphincter is 4/5 and internal sphincter is 3/5. She is presently using Miralax and it is helping. She will be switching to a new medication for her cancer next week and side effect is constipation. Anal manometry results showed rectal hyposensitivity likely due to chronic constipation, and evidence for dyssynergic defecation. Patient was not able to push the therapist finger out of the anal canal. Patient will benefit from skilled therapy to improve dyssynergy defecation and education on correct toileting.     OBJECTIVE IMPAIRMENTS decreased coordination, decreased strength, and pain.   ACTIVITY LIMITATIONS toileting  PARTICIPATION LIMITATIONS: community activity  PERSONAL FACTORS Metastatic breast cancer; diabetes, stage 4 kidney  disease, GASTRIC ROUX-EN-Y are also affecting patient's functional outcome.   REHAB POTENTIAL: Good  CLINICAL DECISION MAKING: Evolving/moderate complexity  EVALUATION COMPLEXITY: Moderate   GOALS: Goals  reviewed with patient? Yes  SHORT TERM GOALS: Target date: 09/21/2021  Patient is independent with HEP to relax her pelvic floor.  Baseline: Goal status: INITIAL   LONG TERM GOALS: Target date: 11/16/2021   Patient is able to push the therapist finger out of the anal canal due to improved coordination of the muscles.  Baseline:  Goal status: INITIAL  2.  Patient strength of pelvic floor is 4/5 with holding for 10 seconds.  Baseline:  Goal status: INITIAL  3.  Patient is able to have a bowel movement without having to wipe more than 3 times due to improved strength of pelvic floor.  Baseline:  Goal status: INITIAL  4.  Patient is able to not strain with a bowel movement due to improve coordination of her muscles and correct technique.  Baseline:  Goal status: INITIAL   PLAN: PT FREQUENCY: 1x/week  PT DURATION: 12 weeks  PLANNED INTERVENTIONS:  Therapeutic exercises, Therapeutic activity, Neuromuscular re-education, Patient/Family education, Self Care, Biofeedback, and Manual therapy  PLAN FOR NEXT SESSION: work on diaphragmatic breathing for bowel movement, pelvic floor contractions, abdominal contraction   Earlie Counts, PT 08/24/21 11:31 AM

## 2021-08-24 NOTE — Progress Notes (Signed)
Per MD request, RN removed dexamethasone from pt medication list. MD states pt will continue to receive 4 mg IV Dexamethasone during tx.

## 2021-08-27 ENCOUNTER — Encounter: Payer: Self-pay | Admitting: Physical Therapy

## 2021-08-27 ENCOUNTER — Ambulatory Visit: Payer: Medicare PPO | Admitting: Physical Therapy

## 2021-08-27 DIAGNOSIS — R278 Other lack of coordination: Secondary | ICD-10-CM | POA: Diagnosis not present

## 2021-08-27 DIAGNOSIS — K5900 Constipation, unspecified: Secondary | ICD-10-CM

## 2021-08-27 DIAGNOSIS — M6281 Muscle weakness (generalized): Secondary | ICD-10-CM | POA: Diagnosis not present

## 2021-08-27 NOTE — Patient Instructions (Signed)
Toileting Techniques for Bowel Movements (Defecation) Using your belly (abdomen) and pelvic floor muscles to have a bowel movement is usually instinctive.  Sometimes people can have problems with these muscles and have to relearn proper defecation (emptying) techniques.  If you have weakness in your muscles, organs that are falling out, decreased sensation in your pelvis, or ignore your urge to go, you may find yourself straining to have a bowel movement.  You are straining if you are: holding your breath or taking in a huge gulp of air and holding it  keeping your lips and jaw tensed and closed tightly turning red in the face because of excessive pushing or forcing developing or worsening your  hemorrhoids getting faint while pushing not emptying completely and have to defecate many times a day  If you are straining, you are actually making it harder for yourself to have a bowel movement.  Many people find they are pulling up with the pelvic floor muscles and closing off instead of opening the anus. Due to lack pelvic floor relaxation and coordination the abdominal muscles, one has to work harder to push the feces out.  Many people have never been taught how to defecate efficiently and effectively.  Notice what happens to your body when you are having a bowel movement.  While you are sitting on the toilet pay attention to the following areas: Jaw and mouth position Angle of your hips   Whether your feet touch the ground or not Arm placement  Spine position Waist Belly tension Anus (opening of the anal canal)  An Evacuation/Defecation Plan   Here are the 4 basic points:  Lean forward enough for your elbows to rest on your knees Support your feet on the floor or use a low stool if your feet don't touch the floor  Push out your belly as if you have swallowed a beach ball--you should feel a widening of your waist Open and relax your pelvic floor muscles, rather than tightening around the  anus       The following conditions my require modifications to your toileting posture:  If you have had surgery in the past that limits your back, hip, pelvic, knee or ankle flexibility Constipation   Your healthcare practitioner may make the following additional suggestions and adjustments:  Sit on the toilet  a) Make sure your feet are supported. b) Notice your hip angle and spine position--most people find it effective to lean forward or raise their knees, which can help the muscles around the anus to relax  c) When you lean forward, place your forearms on your thighs for support  Relax suggestions a) Breath deeply in through your nose and out slowly through your mouth as if you are smelling the flowers and blowing out the candles. b) To become aware of how to relax your muscles, contracting and releasing muscles can be helpful.  Pull your pelvic floor muscles in tightly by using the image of holding back gas, or closing around the anus (visualize making a circle smaller) and lifting the anus up and in.  Then release the muscles and your anus should drop down and feel open. Repeat 5 times ending with the feeling of relaxation. c) Keep your pelvic floor muscles relaxed; let your belly bulge out. d) The digestive tract starts at the mouth and ends at the anal opening, so be sure to relax both ends of the tube.  Place your tongue on the roof of your mouth with your teeth  separated.  This helps relax your mouth and will help to relax the anus at the same time.  Empty (defecation) a) Keep your pelvic floor and sphincter relaxed, then bulge your anal muscles.  Make the anal opening wide.  b) Stick your belly out as if you have swallowed a beach ball. c) Make your belly wall hard using your belly muscles while continuing to breathe. Doing this makes it easier to open your anus. d) Breath out and give a grunt (or try using other sounds such as ahhhh, shhhhh, ohhhh or  grrrrrrr).  4) Finish a) As you finish your bowel movement, pull the pelvic floor muscles up and in.  This will leave your anus in the proper place rather than remaining pushed out and down. If you leave your anus pushed out and down, it will start to feel as though that is normal and give you incorrect signals about needing to have a bowel movement.  [facility] [address], [address] [place], [place] [address] Phone # [phone] Fax [phone]  

## 2021-08-27 NOTE — Therapy (Signed)
OUTPATIENT PHYSICAL THERAPY TREATMENT NOTE   Patient Name: Darlene [name] MRN: [id number] DOB:[birth year], 71 y.o., female Today's Date: 08/27/2021  PCP: Isaac Bliss, Rayford Halsted, MD REFERRING PROVIDER: Thornton Park, MD  END OF SESSION:   PT End of Session - 08/27/21 0934     Visit Number 2    Date for PT Re-Evaluation 11/16/21    Authorization Type [employer]    PT Start Time 0930    PT Stop Time 1010    PT Time Calculation (min) 40 min    Activity Tolerance Patient tolerated treatment well    Behavior During Therapy WFL for tasks assessed/performed             Past Medical History:  Diagnosis Date   Anemia    hx of - during 1st round chemo   Anxiety    Arthritis    in fingers, shoulders   Back pain    Balance problem    Breast cancer ([place])    Breast cancer of upper-outer quadrant of right female breast ([place]) 07/22/2014   Bunion, right foot    CHF (congestive heart failure) ([place])    Chronic kidney disease    creatininei levels high per pt    Clumsiness    Constipation    Depression    Diabetes mellitus without complication ([place])    25+ years type 2    Diabetic retinopathy ([place])    torn retina   Eating disorder    binge eating   Epistaxis 08/26/2014   Fatty liver    Foot drop, left foot    HCAP (healthcare-associated pneumonia) 12/18/2014   Heart murmur    never had any problems   History of radiation therapy 04/05/15-05/19/15   right chest wall was treated to 50.4 Gy in 28 fractions, right mastectomy scar was treated to 10 Gy in 5 fractions   Hyperlipidemia    Hypertension    Joint pain    Multiple food allergies    Neuromuscular disorder ([place])    diabetic neuropathy   Obesity    Obstructive sleep apnea on CPAP    does not use cpap all the time has lost 160 lbs    Ovarian cyst rupture    possible   Pneumonia    Pneumonia 11/2014   Radiation 04/05/15-05/19/15   right chest wall 50.4 Gy, mastectomy scar 10 Gy   Sleep apnea    Thyroid nodule  06/2014   Past Surgical History:  Procedure Laterality Date   ANAL RECTAL MANOMETRY N/A 08/01/2021   Procedure: ANO RECTAL MANOMETRY;  Surgeon: Thornton Park, MD;  Location: [facility];  Service: Gastroenterology;  Laterality: N/A;   APPENDECTOMY     BIOPSY THYROID Bilateral 06/2014   2 nodules - Benign    BREAST LUMPECTOMY WITH RADIOACTIVE SEED AND SENTINEL LYMPH NODE BIOPSY Right 12/27/2014   Procedure: BREAST LUMPECTOMY WITH RADIOACTIVE SEED AND SENTINEL LYMPH NODE BIOPSY;  Surgeon: Stark Klein, MD;  Location: [place];  Service: General;  Laterality: Right;   BREAST REDUCTION SURGERY Bilateral 01/06/2015   Procedure: BILATERAL BREAST REDUCTION, RIGHT ONCOPLASTIC RECONSTRUCTION),LEFT BREAST REDUCTION FOR SYMMETRY;  Surgeon: [name], MD;  Location: [place];  Service: Plastics;  Laterality: Bilateral;   BREATH TEK H PYLORI N/A 09/15/2012   Procedure: BREATH TEK H PYLORI;  Surgeon: Pedro Earls, MD;  Location: [facility];  Service: General;  Laterality: N/A;   BUNIONECTOMY Left 12/2013   CARPAL TUNNEL RELEASE Right    CARPAL TUNNEL RELEASE  Left    CATARACT EXTRACTION     6/19   COLONOSCOPY     DUPUYTREN CONTRACTURE RELEASE     GASTRIC ROUX-EN-Y N/A 11/02/2012   Procedure: LAPAROSCOPIC ROUX-EN-Y GASTRIC;  Surgeon: Pedro Earls, MD;  Location: [facility];  Service: General;  Laterality: N/A;   IR GENERIC HISTORICAL  03/18/2016   IR US GUIDE VASC ACCESS LEFT 03/18/2016 Markus Daft, MD [facility]   IR GENERIC HISTORICAL  03/18/2016   IR FLUORO GUIDE CV LINE LEFT 03/18/2016 Markus Daft, MD [facility]   LAPAROSCOPIC APPENDECTOMY N/A 07/22/2015   Procedure: APPENDECTOMY LAPAROSCOPIC;  Surgeon: Michael Boston, MD;  Location: [facility];  Service: General;  Laterality: N/A;   MASTECTOMY Right 02/15/2015   modified   MODIFIED MASTECTOMY Right 02/15/2015   Procedure: RIGHT MODIFIED RADICAL MASTECTOMY;  Surgeon: Stark Klein, MD;  Location: [place];  Service: General;   Laterality: Right;   ORIF WRIST FRACTURE Left 05/11/2019   Procedure: OPEN REDUCTION INTERNAL FIXATION (ORIF) left distal radius fracture;  Surgeon: Renette Butters, MD;  Location: [facility];  Service: Orthopedics;  Laterality: Left;  with block   PORT-A-CATH REMOVAL Left 10/10/2015   Procedure: PORT REMOVAL;  Surgeon: Stark Klein, MD;  Location: [facility];  Service: General;  Laterality: Left;   PORTACATH PLACEMENT Left 08/02/2014   Procedure: INSERTION PORT-A-CATH;  Surgeon: Stark Klein, MD;  Location: [facility];  Service: General;  Laterality: Left;   PORTACATH PLACEMENT N/A 11/05/2016   Procedure: INSERTION PORT-A-CATH;  Surgeon: Stark Klein, MD;  Location: [facility] [place];  Service: General;  Laterality: N/A;   RETINAL LASER PROCEDURE     retina tear on left eye   ROTATOR CUFF REPAIR Right 2005   SCAR REVISION Right 12/08/2015   Procedure: COMPLEX REPAIR RIGHT CHEST 10-15CM;  Surgeon: [name], MD;  Location: [facility];  Service: Plastics;  Laterality: Right;   TOE SURGERY  1970s   bone spur   TRIGGER FINGER RELEASE     x3   Patient Active Problem List   Diagnosis Date Noted   Constipation due to outlet dysfunction    Stress 07/31/2021   Abnormal findings on diagnostic imaging of other parts of digestive tract 02/09/2021   Allergic rhinitis 02/09/2021   Arthritis of right knee 02/09/2021   Diverticular disease of colon 02/09/2021   Gallstones 02/09/2021   Hyperglycemia due to type 2 diabetes mellitus ([place]) 02/09/2021   Neuropathy 02/09/2021   Pure hypercholesterolemia 02/09/2021   Shoulder joint pain 02/09/2021   Thyroid nodule 02/09/2021   Vitamin B12 deficiency 02/09/2021   CKD (chronic kidney disease) stage 4, GFR 15-29 ml/min (HCC) 09/27/2020   Hammer toe of second toe of right foot 11/16/2018   Hallux valgus of right foot 11/16/2018   Essential hypertension 11/04/2018   Pain due to onychomycosis of toenails of both feet 10/14/2018    Pre-ulcerative calluses 10/14/2018   Diabetic retinal edema ([place]) 07/19/2018   Diabetic retinopathy of both eyes associated with type 2 diabetes mellitus ([place]) 06/28/2018   Goals of care, counseling/discussion 12/30/2017   Occult malignancy ([place]) 12/01/2017   Neoplastic malignant related fatigue 09/29/2017   Metastasis to liver ([place]) 08/04/2017   Lesion of liver 07/30/2017   Type 2 diabetes mellitus without complication, with long-term current use of insulin ([place]) 03/11/2017   Vitamin D deficiency 02/24/2017   Aortic atherosclerosis ([place]) 01/28/2017   Type 2 diabetes mellitus without complication, without long-term current use of insulin ([place]) 12/18/2016   Class  1 obesity with serious comorbidity and body mass index (BMI) of 30.0 to 30.9 in adult 12/18/2016   Port catheter in place 08/21/2015   Acute appendicitis 07/22/2015   Appendicitis with peritonitis 07/22/2015   Acquired absence of right breast and nipple 07/05/2015   History of therapeutic radiation 07/05/2015   Recurrent breast cancer, right ([place]) 02/15/2015   Chemotherapy-induced peripheral neuropathy ([place]) 02/08/2015   Macromastia 01/06/2015   Anemia in neoplastic disease 12/21/2014   Cough    Breast cancer of upper-outer quadrant of right female breast ([place]) 07/22/2014   OSA on CPAP 06/02/2013   Type 2 diabetes mellitus with diabetic neuropathy, with long-term current use of insulin ([place]) 06/02/2013   Lap [place] Gastric Bypass Oct 2014 11/03/2012   Depression 09/10/2012   hyperlipidemia 09/10/2012  REFERRING DIAG: K59.00 (ICD-10-CM) - Constipation, unspecified constipation type   THERAPY DIAG:  Muscle weakness (generalized)   Other lack of coordination   Constipation, unspecified constipation type   Rationale for Evaluation and Treatment Rehabilitation   ONSET DATE: 11/2021   SUBJECTIVE:                                                                                                                                                                                             SUBJECTIVE STATEMENT: I have ordered the squatty potty    PAIN:  Are you having pain? Yes NPRS scale: 5/10 Pain location: Anal   Pain type: dull Pain description: intermittent    Aggravating factors: bowel movement when hard to get out Relieving factors: having a bowel movement   PRECAUTIONS: Other: Metastatic Breast Cancer  with history of radiation therapy 04/05/15-05/19/15;    WEIGHT BEARING RESTRICTIONS No   FALLS:  Has patient fallen in last 6 months? No   LIVING ENVIRONMENT: Lives with: lives with their spouse     OCCUPATION: retired   PLOF: Independent   PATIENT GOALS have a regular bowel movement   PERTINENT HISTORY:  Metastatic breast cancer; diabetes, stage 4 kidney  disease, GASTRIC ROUX-EN-Y   BOWEL MOVEMENT Pain with bowel movement: Yes, when her stool does not move through the rectum Type of bowel movement:Type ([place] Stool Scale) Type 4 recently, Frequency daily/2-3 times per day, Strain Yes, and Splinting she used to but not anymore ; sometimes strains Fully empty rectum: No Leakage: No Pads: No Fiber supplement: miralax URINATION Pain with urination: No Fully empty bladder: Yes:   Stream: Strong Urgency: No Frequency: average Leakage:  none Pads: No       OBJECTIVE:    DIAGNOSTIC FINDINGS:  Anal rectal manometry shows normal internal and external anal sphincter  pressures, rectal hyposensitivity likely due to chronic constipation, and evidence for dyssynergic defecation.   PATIENT SURVEYS:  PFIQ-7 22; CRAIQ-7 29   COGNITION:            Overall cognitive status: Within functional limits for tasks assessed                          SENSATION:            Light touch: Appears intact            Proprioception: Appears intact     GAIT: Assistive device utilized: Quad cane small base Level of assistance: Complete Independence                  POSTURE: No Significant postural  limitations               PELVIC ALIGNMENT:   LUMBARAROM/PROM   A/PROM A/PROM  eval  Flexion Decreased by 25%  Extension Decreased by 25%  Right lateral flexion Decreased by 25%  Left lateral flexion Decreased by 25%  Right rotation Decreased by 25%  Left rotation Decreased by 25%   (Blank rows = not tested)   LOWER EXTREMITY ROM:   Passive ROM Right eval Left eval  Hip flexion 110 110  Hip internal rotation 5 10  Hip external rotation 45 50   (Blank rows = not tested)        PALPATION:   General  after bowel movement she has to wipe many times.                  External Perineal Exam stool located on the outside of the rectum                             Internal Pelvic Floor no tightness or tenderness   Patient confirms identification and approves PT to assess internal pelvic floor and treatment Yes   PELVIC MMT:   MMT eval  Internal Anal Sphincter 3/5  External Anal Sphincter 4/5  Puborectalis 4/5  (Blank rows = not tested)         TODAY'S TREATMENT  08/27/2021 Neuromuscular re-education: Pelvic floor contraction training: supine anal contraction holding for 5 sec 15x with ball squeeze Down training:diaphragmatic breathing with red band around the lower rib cage to open up  then do in sitting Exercises: Stretches/mobility:single knee to chest holding 15 sec 2 times each leg Piriformis stretch holding 30 sec 2 time each leg with pushing knee down in supine Strengthening:sitting marching with red band around thighs with pelvic floor contraction 30x Sitting hip abduction with red band and anal contraction 30x Sitting knee extension with red band and ball between knees with anal contraction 10x each leg Sit to stand with pelvic floor contraction and breathing out 10x Therapeutic activities: Functional strengthening activities:Educated patient on how to toilet correctly with knees above hips, correct breath, contract the anus after bowel movement      PATIENT  EDUCATION: Education details: Access Code: [email or web address], toileting Person educated: Patient Education method: Explanation, Demonstration, Tactile cues, Verbal cues, and Handouts Education comprehension: verbalized understanding, returned demonstration, verbal cues required, tactile cues required, and needs further education        HOME EXERCISE PROGRAM: 08/27/2021 Access Code: [email or web address] URL: [email or web address] Date: 08/27/2021 Prepared by: Earlie Counts  Exercises - Seated Diaphragmatic Breathing  - 1 x daily - 7 x weekly -  1 sets - 10 reps - Supine Single Knee to Chest  - 1 x daily - 7 x weekly - 1 sets - 2 reps - 15 sec hold - Supine Figure 4 Piriformis Stretch  - 1 x daily - 7 x weekly - 1 sets - 2 reps - 30 sec hold - Supine Hip Adduction Isometric with Ball  - 1 x daily - 7 x weekly - 1 sets - 10 reps - 5 sec hold - Seated March with Resistance  - 1 x daily - 3 x weekly - 1 sets - 20 reps - Seated Pelvic Floor Contraction with Hip Abduction and Resistance Loop  - 1 x daily - 3 x weekly - 3 sets - 10 reps - Seated Knee Extension with Resistance  - 1 x daily - 3 x weekly - 2 sets - 10 reps   ASSESSMENT:   CLINICAL IMPRESSION: Patient is a 71 y.o. female who was seen today for physical therapy  treatment for constipation. Patient has had constipation since 11/2020. Patient was able to perform diaphragmatic breathing correctly in supine and sitting. Patient is able to not hold her breath with sit to stand. Patient has ordered her squatty potty.  Patient will benefit from skilled therapy to improve dyssynergy defecation and education on correct toileting.      OBJECTIVE IMPAIRMENTS decreased coordination, decreased strength, and pain.    ACTIVITY LIMITATIONS toileting   PARTICIPATION LIMITATIONS: community activity   PERSONAL FACTORS Metastatic breast cancer; diabetes, stage 4 kidney  disease, GASTRIC ROUX-EN-Y are also affecting patient's functional outcome.    REHAB  POTENTIAL: Good   CLINICAL DECISION MAKING: Evolving/moderate complexity   EVALUATION COMPLEXITY: Moderate     GOALS: Goals reviewed with patient? Yes   SHORT TERM GOALS: Target date: 09/21/2021   Patient is independent with HEP to relax her pelvic floor.  Baseline: Goal status: INITIAL     LONG TERM GOALS: Target date: 11/16/2021    Patient is able to push the therapist finger out of the anal canal due to improved coordination of the muscles.  Baseline:  Goal status: INITIAL   2.  Patient strength of pelvic floor is 4/5 with holding for 10 seconds.  Baseline:  Goal status: INITIAL   3.  Patient is able to have a bowel movement without having to wipe more than 3 times due to improved strength of pelvic floor.  Baseline:  Goal status: INITIAL   4.  Patient is able to not strain with a bowel movement due to improve coordination of her muscles and correct technique.  Baseline:  Goal status: INITIAL     PLAN: PT FREQUENCY: 1x/week   PT DURATION: 12 weeks   PLANNED INTERVENTIONS: Therapeutic exercises, Therapeutic activity, Neuromuscular re-education, Patient/Family education, Self Care, Biofeedback, and Manual therapy   PLAN FOR NEXT SESSION: review toileting, see how it is going with the new mediation and biopsy, abdominal contraction  Earlie Counts, PT 08/27/21 10:16 AM

## 2021-08-28 DIAGNOSIS — M25572 Pain in left ankle and joints of left foot: Secondary | ICD-10-CM | POA: Diagnosis not present

## 2021-08-28 DIAGNOSIS — E118 Type 2 diabetes mellitus with unspecified complications: Secondary | ICD-10-CM | POA: Diagnosis not present

## 2021-08-28 DIAGNOSIS — M25671 Stiffness of right ankle, not elsewhere classified: Secondary | ICD-10-CM | POA: Diagnosis not present

## 2021-08-28 DIAGNOSIS — I10 Essential (primary) hypertension: Secondary | ICD-10-CM | POA: Diagnosis not present

## 2021-08-28 DIAGNOSIS — F339 Major depressive disorder, recurrent, unspecified: Secondary | ICD-10-CM | POA: Diagnosis not present

## 2021-08-28 DIAGNOSIS — Z9181 History of falling: Secondary | ICD-10-CM | POA: Diagnosis not present

## 2021-08-28 DIAGNOSIS — M6259 Muscle wasting and atrophy, not elsewhere classified, multiple sites: Secondary | ICD-10-CM | POA: Diagnosis not present

## 2021-08-28 DIAGNOSIS — R269 Unspecified abnormalities of gait and mobility: Secondary | ICD-10-CM | POA: Diagnosis not present

## 2021-08-29 ENCOUNTER — Other Ambulatory Visit: Payer: Self-pay | Admitting: Radiology

## 2021-08-29 ENCOUNTER — Encounter (INDEPENDENT_AMBULATORY_CARE_PROVIDER_SITE_OTHER): Payer: Self-pay

## 2021-08-29 DIAGNOSIS — Z853 Personal history of malignant neoplasm of breast: Secondary | ICD-10-CM

## 2021-08-30 ENCOUNTER — Ambulatory Visit (HOSPITAL_COMMUNITY)
Admission: RE | Admit: 2021-08-30 | Discharge: 2021-08-30 | Disposition: A | Discharge status: Home / Self Care | Payer: Medicare PPO | Source: Ambulatory Visit | Attending: Hematology and Oncology | Admitting: Hematology and Oncology

## 2021-08-30 ENCOUNTER — Other Ambulatory Visit: Payer: Self-pay

## 2021-08-30 ENCOUNTER — Encounter (HOSPITAL_COMMUNITY): Payer: Self-pay

## 2021-08-30 DIAGNOSIS — E1122 Type 2 diabetes mellitus with diabetic chronic kidney disease: Secondary | ICD-10-CM | POA: Insufficient documentation

## 2021-08-30 DIAGNOSIS — F32A Depression, unspecified: Secondary | ICD-10-CM | POA: Insufficient documentation

## 2021-08-30 DIAGNOSIS — E785 Hyperlipidemia, unspecified: Secondary | ICD-10-CM | POA: Diagnosis not present

## 2021-08-30 DIAGNOSIS — F419 Anxiety disorder, unspecified: Secondary | ICD-10-CM | POA: Diagnosis not present

## 2021-08-30 DIAGNOSIS — I700 Atherosclerosis of aorta: Secondary | ICD-10-CM | POA: Diagnosis not present

## 2021-08-30 DIAGNOSIS — C787 Secondary malignant neoplasm of liver and intrahepatic bile duct: Secondary | ICD-10-CM | POA: Diagnosis not present

## 2021-08-30 DIAGNOSIS — Z9011 Acquired absence of right breast and nipple: Secondary | ICD-10-CM | POA: Diagnosis not present

## 2021-08-30 DIAGNOSIS — C50411 Malignant neoplasm of upper-outer quadrant of right female breast: Secondary | ICD-10-CM | POA: Diagnosis not present

## 2021-08-30 DIAGNOSIS — N189 Chronic kidney disease, unspecified: Secondary | ICD-10-CM | POA: Diagnosis not present

## 2021-08-30 DIAGNOSIS — I130 Hypertensive heart and chronic kidney disease with heart failure and stage 1 through stage 4 chronic kidney disease, or unspecified chronic kidney disease: Secondary | ICD-10-CM | POA: Insufficient documentation

## 2021-08-30 DIAGNOSIS — G4730 Sleep apnea, unspecified: Secondary | ICD-10-CM | POA: Diagnosis not present

## 2021-08-30 DIAGNOSIS — Z171 Estrogen receptor negative status [ER-]: Secondary | ICD-10-CM | POA: Diagnosis not present

## 2021-08-30 DIAGNOSIS — C50911 Malignant neoplasm of unspecified site of right female breast: Secondary | ICD-10-CM | POA: Diagnosis not present

## 2021-08-30 DIAGNOSIS — I509 Heart failure, unspecified: Secondary | ICD-10-CM | POA: Diagnosis not present

## 2021-08-30 DIAGNOSIS — D649 Anemia, unspecified: Secondary | ICD-10-CM | POA: Diagnosis not present

## 2021-08-30 DIAGNOSIS — Z853 Personal history of malignant neoplasm of breast: Secondary | ICD-10-CM

## 2021-08-30 LAB — COMPREHENSIVE METABOLIC PANEL
ALT: 16 U/L (ref 0–44)
AST: 30 U/L (ref 15–41)
Albumin: 3.6 g/dL (ref 3.5–5.0)
Alkaline Phosphatase: 51 U/L (ref 38–126)
Anion gap: 6 (ref 5–15)
BUN: 45 mg/dL — ABNORMAL HIGH (ref 8–23)
CO2: 25 mmol/L (ref 22–32)
Calcium: 8.9 mg/dL (ref 8.9–10.3)
Chloride: 111 mmol/L (ref 98–111)
Creatinine, Ser: 1.89 mg/dL — ABNORMAL HIGH (ref 0.44–1.00)
GFR, Estimated: 29 mL/min — ABNORMAL LOW (ref >60)
Glucose, Bld: 90 mg/dL (ref 70–99)
Potassium: 4.2 mmol/L (ref 3.5–5.1)
Sodium: 142 mmol/L (ref 135–145)
Total Bilirubin: 0.7 mg/dL (ref 0.3–1.2)
Total Protein: 6.2 g/dL — ABNORMAL LOW (ref 6.5–8.1)

## 2021-08-30 LAB — PROTIME-INR
INR: 1.1 (ref 0.8–1.2)
Prothrombin Time: 14.6 seconds (ref 11.4–15.2)

## 2021-08-30 LAB — CBC WITH DIFFERENTIAL/PLATELET
Abs Immature Granulocytes: 0.03 10*3/uL (ref 0.00–0.07)
Basophils Absolute: 0 10*3/uL (ref 0.0–0.1)
Basophils Relative: 0 %
Eosinophils Absolute: 0.1 10*3/uL (ref 0.0–0.5)
Eosinophils Relative: 3 %
HCT: 31.3 % — ABNORMAL LOW (ref 36.0–46.0)
Hemoglobin: 10.3 g/dL — ABNORMAL LOW (ref 12.0–15.0)
Immature Granulocytes: 1 %
Lymphocytes Relative: 20 %
Lymphs Abs: 1 10*3/uL (ref 0.7–4.0)
MCH: 33.3 pg (ref 26.0–34.0)
MCHC: 32.9 g/dL (ref 30.0–36.0)
MCV: 101.3 fL — ABNORMAL HIGH (ref 80.0–100.0)
Monocytes Absolute: 0.4 10*3/uL (ref 0.1–1.0)
Monocytes Relative: 8 %
Neutro Abs: 3.4 10*3/uL (ref 1.7–7.7)
Neutrophils Relative %: 68 %
Platelets: 164 10*3/uL (ref 150–400)
RBC: 3.09 MIL/uL — ABNORMAL LOW (ref 3.87–5.11)
RDW: 15.3 % (ref 11.5–15.5)
WBC: 5 10*3/uL (ref 4.0–10.5)
nRBC: 0 % (ref 0.0–0.2)

## 2021-08-30 LAB — GLUCOSE, CAPILLARY: Glucose-Capillary: 84 mg/dL (ref 70–99)

## 2021-08-30 MED ORDER — FENTANYL CITRATE (PF) 100 MCG/2ML IJ SOLN
INTRAMUSCULAR | Status: AC | PRN
  Administered 2021-08-30 (×2): 50 ug via INTRAVENOUS

## 2021-08-30 MED ORDER — MIDAZOLAM HCL 2 MG/2ML IJ SOLN
INTRAMUSCULAR | Status: AC | PRN
  Administered 2021-08-30: .5 mg via INTRAVENOUS
  Administered 2021-08-30: 1 mg via INTRAVENOUS

## 2021-08-30 MED ORDER — LIDOCAINE HCL 1 % IJ SOLN
INTRAMUSCULAR | Status: AC
  Filled 2021-08-30: qty 20

## 2021-08-30 MED ORDER — SODIUM CHLORIDE 0.9 % IV SOLN: INTRAVENOUS | Status: DC

## 2021-08-30 MED ORDER — GELATIN ABSORBABLE 12-7 MM EX MISC
CUTANEOUS | Status: AC
  Filled 2021-08-30: qty 1

## 2021-08-30 MED ORDER — HEPARIN SOD (PORK) LOCK FLUSH 100 UNIT/ML IV SOLN
500.0000 [IU] | INTRAVENOUS | Status: AC | PRN
  Administered 2021-08-30: 500 [IU]
  Filled 2021-08-30: qty 5

## 2021-08-30 MED ORDER — MIDAZOLAM HCL 2 MG/2ML IJ SOLN
INTRAMUSCULAR | Status: AC
  Filled 2021-08-30: qty 2

## 2021-08-30 MED ORDER — LIDOCAINE HCL (PF) 1 % IJ SOLN
INTRAMUSCULAR | Status: AC | PRN
  Administered 2021-08-30: 10 mL

## 2021-08-30 MED ORDER — FENTANYL CITRATE (PF) 100 MCG/2ML IJ SOLN
INTRAMUSCULAR | Status: AC
  Filled 2021-08-30: qty 2

## 2021-08-30 MED FILL — Fosaprepitant Dimeglumine For IV Infusion 150 MG (Base Eq): INTRAVENOUS | Qty: 5 | Status: AC

## 2021-08-30 MED FILL — Dexamethasone Sodium Phosphate Inj 100 MG/10ML: INTRAMUSCULAR | Qty: 1 | Status: AC

## 2021-08-30 NOTE — Discharge Instructions (Signed)
Please call Interventional Radiology clinic [phone] with any questions or concerns.   You may remove your dressing and shower tomorrow.   Liver Biopsy, Care After After a liver biopsy, it is common to have these things in the area where the biopsy was done. You may: Have pain. Feel sore. Have bruising. You may also feel tired for a few days. Follow these instructions at home: Medicines Take over-the-counter and prescription medicines only as told by your doctor. If you were prescribed an antibiotic medicine, take it as told by your doctor. Do not stop taking the antibiotic, even if you start to feel better. Do not take medicines that may thin your blood. These medicines include aspirin and ibuprofen. Take them only if your doctor tells you to. If told, take steps to prevent problems with pooping (constipation). You may need to: Drink enough fluid to keep your pee (urine) pale yellow. Take medicines. You will be told what medicines to take. Eat foods that are high in fiber. These include beans, whole grains, and fresh fruits and vegetables. Limit foods that are high in fat and sugar. These include fried or sweet foods. Ask your doctor if you should avoid driving or using machines while you are taking your medicine. Caring for your incision Follow instructions from your doctor about how to take care of your cut from surgery (incisions). Make sure you: Wash your hands with soap and water for at least 20 seconds before and after you change your bandage. If you cannot use soap and water, use hand sanitizer. Change your bandage. Leavestitches or skin glue in place for at least two weeks. Leave tape strips alone unless you are told to take them off. You may trim the edges of the tape strips if they curl up. Check your incision every day for signs of infection. Check for: Redness, swelling, or more pain. Fluid or blood. Warmth. Pus or a bad smell. Do not take baths, swim, or use a  hot tub. Ask your doctor about taking showers or sponge baths. Activity Rest at home for 1-2 days, or as told by your doctor. Get up to take short walks every 1 to 2 hours. Ask for help if you feel weak or unsteady. Do not lift anything that is heavier than 10 lb (4.5 kg), or the limit that you are told. Do not play contact sports for 2 weeks after the procedure. Return to your normal activities as told by your doctor. Ask what activities are safe for you. General instructions  Do not drink alcohol in the first week after the procedure. Plan to have a responsible adult care for you for the time you are told after you leave the hospital or clinic. This is important. It is up to you to get the results of your procedure. Ask how to get your results when they are ready. Keep all follow-up visits.   Contact a doctor if: You have more bleeding in your incision. Your incision swells, or is red and more painful. You have fluid that comes from your incision. You develop a rash. You have fever or chills. Get help right away if: You have swelling, bloating, or pain in your belly (abdomen). You get dizzy or faint. You vomit or you feel like vomiting. You have trouble breathing or feel short of breath. You have chest pain. You have problems talking or seeing. You have trouble with your balance or moving your arms or legs. These symptoms may be an emergency. Get help   right away. Call your local emergency services (911 in the [place] Do not wait to see if the symptoms will go away. Do not drive yourself to the hospital. Summary After the procedure, it is common to have pain, soreness, bruising, and tiredness. Your doctor will tell you how to take care of yourself at home. Change your bandage, take your medicines, and limit your activities as told by your doctor. Call your doctor if you have symptoms of infection. Get help right away if your belly swells, your cut bleeds a lot, or you have trouble  talking or breathing. This information is not intended to replace advice given to you by your health care provider. Make sure you discuss any questions you have with your healthcare provider. Document Revised: 11/22/2019 Document Reviewed: 11/22/2019 [employer] Patient Education  2022 [employer]     Moderate Conscious Sedation, Adult, Care After This sheet gives you information about how to care for yourself after your procedure. Your health care provider may also give you more specific instructions. If you have problems or questions, contact your health care provider. What can I expect after the procedure? After the procedure, it is common to have: Sleepiness for several hours. Impaired judgment for several hours. Difficulty with balance. Vomiting if you eat too soon. Follow these instructions at home: For the time period you were told by your health care provider: Rest. Do not participate in activities where you could fall or become injured. Do not drive or use machinery. Do not drink alcohol. Do not take sleeping pills or medicines that cause drowsiness. Do not make important decisions or sign legal documents. Do not take care of children on your own.        Eating and drinking Follow the diet recommended by your health care provider. Drink enough fluid to keep your urine pale yellow. If you vomit: Drink water, juice, or soup when you can drink without vomiting. Make sure you have little or no nausea before eating solid foods.    General instructions Take over-the-counter and prescription medicines only as told by your health care provider. Have a responsible adult stay with you for the time you are told. It is important to have someone help care for you until you are awake and alert. Do not smoke. Keep all follow-up visits as told by your health care provider. This is important. Contact a health care provider if: You are still sleepy or having trouble with balance after  24 hours. You feel light-headed. You keep feeling nauseous or you keep vomiting. You develop a rash. You have a fever. You have redness or swelling around the IV site. Get help right away if: You have trouble breathing. You have new-onset confusion at home. Summary After the procedure, it is common to feel sleepy, have impaired judgment, or feel nauseous if you eat too soon. Rest after you get home. Know the things you should not do after the procedure. Follow the diet recommended by your health care provider and drink enough fluid to keep your urine pale yellow. Get help right away if you have trouble breathing or new-onset confusion at home. This information is not intended to replace advice given to you by your health care provider. Make sure you discuss any questions you have with your health care provider. Document Revised: 05/07/2019 Document Reviewed: 12/03/2018 [employer] Patient Education  2021 [employer]    

## 2021-08-30 NOTE — Procedures (Signed)
Interventional Radiology Procedure Note  Procedure: US guided core biopsy of liver mass.   Complications: None  Estimated Blood Loss: None  Recommendations: - Bedrest x 2 hrs - DC home    Signed,  Suzanna Zahn K. Vena Bassinger, MD    

## 2021-08-30 NOTE — Consult Note (Signed)
Chief Complaint: Patient was seen in consultation today for image guided liver mass biopsy  Referring Physician(s): [place]  Supervising Physician: Jacqulynn Cadet  Patient Status: [facility] - Out-pt  History of Present Illness: Darlene [name] is a 71 y.o. female with past medical history significant for anemia, anxiety, arthritis, CHF, chronic kidney disease, depression, diabetes, fatty liver, hypertension, hyperlipidemia, sleep apnea, and right breast cancer in 2016, status post bilat mastectomies/chemoradiation. F/u CT on 08/20/21 revealed:  1. Increased size of the metastatic hepatic lesions. 2. Stable size of the right subpectoral lymph node which demonstrated mild hypermetabolism on prior PET-CT. 3. No new sites of possible metastatic disease identified within the chest, abdomen or pelvis. 4. Stable prominent/mildly enlarged upper abdominal lymph nodes which were not hypermetabolic on prior PET-CT and favored to reflect a benign reactive process. Attention on follow-up imaging suggested. 5. Prior right mastectomy and right axillary lymph node dissection. 6. Bilateral nonobstructive renal stones. 7.  Aortic Atherosclerosis  She presents today for image guided liver mass biopsy for further evaluation.    Past Medical History:  Diagnosis Date   Anemia    hx of - during 1st round chemo   Anxiety    Arthritis    in fingers, shoulders   Back pain    Balance problem    Breast cancer ([place])    Breast cancer of upper-outer quadrant of right female breast ([place]) 07/22/2014   Bunion, right foot    CHF (congestive heart failure) (HCC)    Chronic kidney disease    creatininei levels high per pt    Clumsiness    Constipation    Depression    Diabetes mellitus without complication ([place])    31+ years type 2    Diabetic retinopathy ([place])    torn retina   Eating disorder    binge eating   Epistaxis 08/26/2014   Fatty liver    Foot drop, left foot    HCAP  (healthcare-associated pneumonia) 12/18/2014   Heart murmur    never had any problems   History of radiation therapy 04/05/15-05/19/15   right chest wall was treated to 50.4 Gy in 28 fractions, right mastectomy scar was treated to 10 Gy in 5 fractions   Hyperlipidemia    Hypertension    Joint pain    Multiple food allergies    Neuromuscular disorder ([place])    diabetic neuropathy   Obesity    Obstructive sleep apnea on CPAP    does not use cpap all the time has lost 160 lbs    Ovarian cyst rupture    possible   Pneumonia    Pneumonia 11/2014   Radiation 04/05/15-05/19/15   right chest wall 50.4 Gy, mastectomy scar 10 Gy   Sleep apnea    Thyroid nodule 06/2014    Past Surgical History:  Procedure Laterality Date   ANAL RECTAL MANOMETRY N/A 08/01/2021   Procedure: ANO RECTAL MANOMETRY;  Surgeon: [place] Park, MD;  Location: [facility];  Service: Gastroenterology;  Laterality: N/A;   APPENDECTOMY     BIOPSY THYROID Bilateral 06/2014   2 nodules - Benign    BREAST LUMPECTOMY WITH RADIOACTIVE SEED AND SENTINEL LYMPH NODE BIOPSY Right 12/27/2014   Procedure: BREAST LUMPECTOMY WITH RADIOACTIVE SEED AND SENTINEL LYMPH NODE BIOPSY;  Surgeon: [place] Klein, MD;  Location: [place];  Service: General;  Laterality: Right;   BREAST REDUCTION SURGERY Bilateral 01/06/2015   Procedure: BILATERAL BREAST REDUCTION, RIGHT ONCOPLASTIC RECONSTRUCTION),LEFT BREAST REDUCTION FOR SYMMETRY;  Surgeon: [name], MD;  Location: [place];  Service: Plastics;  Laterality: Bilateral;   BREATH TEK H PYLORI N/A 09/15/2012   Procedure: BREATH TEK H PYLORI;  Surgeon: Pedro Earls, MD;  Location: [facility];  Service: General;  Laterality: N/A;   BUNIONECTOMY Left 12/2013   CARPAL TUNNEL RELEASE Right    CARPAL TUNNEL RELEASE Left    CATARACT EXTRACTION     6/19   COLONOSCOPY     DUPUYTREN CONTRACTURE RELEASE     GASTRIC ROUX-EN-Y N/A 11/02/2012   Procedure: LAPAROSCOPIC ROUX-EN-Y  GASTRIC;  Surgeon: Pedro Earls, MD;  Location: [facility];  Service: General;  Laterality: N/A;   IR GENERIC HISTORICAL  03/18/2016   IR US GUIDE VASC ACCESS LEFT 03/18/2016 Markus Daft, MD [facility]   IR GENERIC HISTORICAL  03/18/2016   IR FLUORO GUIDE CV LINE LEFT 03/18/2016 Markus Daft, MD [facility]   LAPAROSCOPIC APPENDECTOMY N/A 07/22/2015   Procedure: APPENDECTOMY LAPAROSCOPIC;  Surgeon: Michael Boston, MD;  Location: [facility];  Service: General;  Laterality: N/A;   MASTECTOMY Right 02/15/2015   modified   MODIFIED MASTECTOMY Right 02/15/2015   Procedure: RIGHT MODIFIED RADICAL MASTECTOMY;  Surgeon: [place] Klein, MD;  Location: [place];  Service: General;  Laterality: Right;   ORIF WRIST FRACTURE Left 05/11/2019   Procedure: OPEN REDUCTION INTERNAL FIXATION (ORIF) left distal radius fracture;  Surgeon: Renette Butters, MD;  Location: [facility];  Service: Orthopedics;  Laterality: Left;  with block   PORT-A-CATH REMOVAL Left 10/10/2015   Procedure: PORT REMOVAL;  Surgeon: [place] Klein, MD;  Location: [place];  Service: General;  Laterality: Left;   PORTACATH PLACEMENT Left 08/02/2014   Procedure: INSERTION PORT-A-CATH;  Surgeon: [place] Klein, MD;  Location: [place];  Service: General;  Laterality: Left;   PORTACATH PLACEMENT N/A 11/05/2016   Procedure: INSERTION PORT-A-CATH;  Surgeon: [place] Klein, MD;  Location: [place];  Service: General;  Laterality: N/A;   RETINAL LASER PROCEDURE     retina tear on left eye   ROTATOR CUFF REPAIR Right 2005   SCAR REVISION Right 12/08/2015   Procedure: COMPLEX REPAIR RIGHT CHEST 10-15CM;  Surgeon: [name], MD;  Location: [place];  Service: Plastics;  Laterality: Right;   TOE SURGERY  1970s   bone spur   TRIGGER FINGER RELEASE     x3    Allergies: Nsaids and Tape  Medications: Prior to Admission medications   Medication Sig Start Date End Date Taking? Authorizing Provider  Accu-Chek Softclix Lancets lancets 1  each by Other route 4 (four) times daily. Test blood sugars 4 x times daily 05/08/21  Yes Beasley, Caren D, MD  aspirin EC 81 MG tablet Take 1 tablet (81 mg total) by mouth daily. 03/18/19  Yes Larey Dresser, MD  Bempedoic Acid-Ezetimibe (NEXLIZET) 180-10 MG TABS Take 1 tablet by mouth daily. 08/08/21  Yes Larey Dresser, MD  Blood Glucose Calibration (ACCU-CHEK GUIDE CONTROL) LIQD 1 Bottle by In Vitro route as needed. 04/15/19  Yes Beasley, Caren D, MD  Blood Glucose Monitoring Suppl (ACCU-CHEK GUIDE) w/Device KIT 1 each by Does not apply route 4 (four) times daily. Use to test blood sugars 4x a day 03/08/19  Yes Beasley, Caren D, MD  buPROPion [facility] SR) 200 MG 12 hr tablet Take one tablet by mouth once daily. 07/31/21  Yes Beasley, Caren D, MD  CALCIUM CITRATE-VITAMIN D PO Take 1 tablet by mouth 3 (three) times daily.  Yes [provider]  carvedilol (COREG) 6.25 MG tablet Take 1 tablet (6.25 mg total) by mouth 2 (two) times daily. 10/05/20  Yes Beasley, Caren D, MD  cholecalciferol (VITAMIN D) 1000 units tablet Take 1,000 Units by mouth daily.   Yes [provider]  Continuous Blood Gluc Sensor (FREESTYLE LIBRE 2 SENSOR) MISC  12/29/20  Yes [provider]  Cyanocobalamin (VITAMIN B-12 PO) Take 1 tablet by mouth every 14 (fourteen) days.   Yes [provider]  glucose blood (ACCU-CHEK GUIDE) test strip Use Accu Chek test strips to check blood sugar 4 times daily. DX:E11.21 10/05/20  Yes Beasley, Caren D, MD  insulin aspart (NOVOLOG) 100 UNIT/ML injection Inject into the skin daily as needed for high blood sugar. Per patient, sliding scale with meals as needed   Yes [provider]  insulin glargine (LANTUS SOLOSTAR) 100 UNIT/ML Solostar Pen Inject 8 Units into the skin daily. 10/05/20  Yes Beasley, Caren D, MD  KERENDIA 20 MG TABS Take 1 tablet by mouth daily. 08/07/21  Yes Larey Dresser, MD  lidocaine-prilocaine (EMLA) cream Apply to affected area  once 08/22/21  Yes Nicholas Lose, MD  loratadine (CLARITIN) 10 MG tablet Take 10 mg by mouth as needed for allergies.   Yes [provider]  melatonin 5 MG TABS Take 5 mg by mouth at bedtime.   Yes [provider]  omeprazole (PRILOSEC) 40 MG capsule Take 1 capsule (40 mg total) by mouth daily. 11/07/20  Yes Magrinat, Virgie Dad, MD  Pediatric Multivitamins-Iron (FLINTSTONES PLUS IRON) chewable tablet Chew 1 tablet by mouth 2 (two) times daily.   Yes [provider]  polyethylene glycol (MIRALAX / GLYCOLAX) 17 g packet Take 17 g by mouth daily.   Yes [provider]  Probiotic Product ([employer] PROBIOTIC PO) Take 1 capsule by mouth daily.    Yes [provider]  Propylene Glycol (SYSTANE BALANCE OP) Place 1-2 drops into both eyes 3 (three) times daily as needed (for dry eyes).    Yes [provider]  SURE COMFORT PEN NEEDLES 32G X 4 MM MISC Use as instructed to inject insulin and Victoza daily. 09/21/20  Yes Beasley, Caren D, MD  vortioxetine HBr (TRINTELLIX) 20 MG TABS tablet Take 1 tablet (20 mg total) by mouth at bedtime. 07/31/21  Yes Beasley, Caren D, MD  diphenhydrAMINE (BENADRYL) 25 MG tablet Take 25 mg by mouth daily as needed for allergies or sleep.    [provider]  tobramycin (TOBREX) 0.3 % ophthalmic ointment Place 1 application into both eyes as needed (Takes day before eye injections).    [provider]  prochlorperazine (COMPAZINE) 10 MG tablet Take 1 tablet (10 mg total) by mouth every 6 (six) hours as needed (Nausea or vomiting). 06/11/21 08/22/21  Nicholas Lose, MD     Family History  Problem Relation Age of Onset   CVA Mother    Heart disease Mother    Sudden death Mother    Diabetes Father    Heart failure Father    Hypertension Father    Kidney disease Father    Obesity Father    Hypertension Sister    Diabetes Brother    Breast cancer Paternal Grandmother 60   Diabetes Paternal Uncle    Ovarian cancer Neg  Hx     Social History   Socioeconomic History   Marital status: Married    Spouse name: [name]   Number of children: 0   Years of  education: Bachelor's   Highest education level: Bachelor's degree (e.g., BA, AB, BS)  Occupational History   Occupation: Pensions consultant: [employer]  Tobacco Use   Smoking status: Never   Smokeless tobacco: Never  Vaping Use   Vaping Use: Never used  Substance and Sexual Activity   Alcohol use: Yes    Alcohol/week: 1.0 - 2.0 standard drink of alcohol    Types: 1 - 2 Standard drinks or equivalent per week    Comment: social   Drug use: No   Sexual activity: Yes    Partners: Male    Birth control/protection: Post-menopausal, Other-see comments    Comment: husband vasectomy  Other Topics Concern   Not on file  Social History Narrative   Patient is married Health and safety inspector) and lives at home with her husband.   Patient does not have any children.   Patient is working for a [employer].   Patient has a Bachelor's degree.   Patient is right-handed.   Patient does not drink any caffeine.   Social Determinants of Health   Financial Resource Strain: Low Risk  (05/19/2021)   Overall Financial Resource Strain (CARDIA)    Difficulty of Paying Living Expenses: Not hard at all  Food Insecurity: No Food Insecurity (05/19/2021)   Hunger Vital Sign    Worried About Running Out of Food in the Last Year: Never true    Ran Out of Food in the Last Year: Never true  Transportation Needs: No Transportation Needs (05/19/2021)   PRAPARE - Hydrologist (Medical): No    Lack of Transportation (Non-Medical): No  Physical Activity: Unknown (05/19/2021)   Exercise Vital Sign    Days of Exercise per Week: 1 day    Minutes of Exercise per Session: Not on file  Stress: No Stress Concern Present (05/19/2021)   [place]    Feeling of Stress : Not at all   Social Connections: [place] (05/19/2021)   Social Connection and Isolation Panel [NHANES]    Frequency of Communication with Friends and Family: Three times a week    Frequency of Social Gatherings with Friends and Family: Twice a week    Attends Religious Services: 1 to 4 times per year    Active Member of [employer] or Organizations: Yes    Attends Music therapist: More than 4 times per year    Marital Status: Married      Review of Systems denies fever, HA,CP,dyspnea, cough, abd/back pain,N/V or bleeding  Vital Signs: BP 136/70   Pulse (!) 54   Temp 98.5 F (36.9 C) (Oral)   Resp 16   Ht _0  (1.753 m)   Wt 172 lb (78 kg)   LMP 10/22/2007   SpO2 98%   BMI 25.40 kg/m      Physical Exam awake,alert; chest- CTA bilat; left chest port intact, heart- RRR; abd- soft,+BS,NT; no LE edema  Imaging: CT CHEST ABDOMEN PELVIS WO CONTRAST  Result Date: 08/20/2021 CLINICAL DATA:  History of metastatic right-sided breast cancer status post lumpectomy and mastectomy in 2016, follow-up. * Tracking Code: BO * EXAM: CT CHEST, ABDOMEN AND PELVIS WITHOUT CONTRAST TECHNIQUE: Multidetector CT imaging of the chest, abdomen and pelvis was performed following the standard protocol without IV contrast. RADIATION DOSE REDUCTION: This exam was performed according to the departmental dose-optimization program which includes automated exposure control, adjustment of the mA and/or kV according  to patient size and/or use of iterative reconstruction technique. COMPARISON:  Multiple priors including most recent PET-CT Jun 07, 2021 FINDINGS: CT CHEST FINDINGS Cardiovascular: Left chest Port-A-Cath with tip at the superior cavoatrial junction. Aortic atherosclerosis. Normal size heart. Mitral annular and coronary artery calcifications. No significant pericardial effusion/thickening. Mediastinum/Nodes: No suspicious thyroid nodule. No pathologically enlarged mediastinal, hilar or axillary lymph  nodes, noting limited sensitivity for the detection of hilar adenopathy on this noncontrast study. Unchanged size of the 3 mm subpectoral lymph node which demonstrated mild metabolic activity on most recent prior PET-CT. Lungs/Pleura: Mild diffuse bronchial wall thickening. Subpleural reticulations in the anterior right middle lobe and lateral right lower lobe may reflect postradiation/posttreatment change. In the anterior paramedian bilateral upper lobes are linear bands of atelectasis versus scarring. Osteophyte related scarring along the paramedian right lower lobe. No suspicious pulmonary nodules or masses. No pleural effusion. No pneumothorax. Musculoskeletal: Similar surgical changes of right mastectomy and right axillary lymph node dissection. No aggressive lytic or blastic lesion of bone. Multilevel degenerative changes spine with bridging anterior vertebral osteophytes. CT ABDOMEN PELVIS FINDINGS Hepatobiliary: Increase size of the hepatic metastatic lesions. For reference: -peripheral right lobe of the liver lesion now measuring 7.5 x 3.9 cm on image 53/3 previously 6.0 x 3.3 cm. -hepatic dome lesion now measures 17 mm on image 49/3 previously 10 mm. Cholelithiasis without findings of acute cholecystitis. No biliary ductal dilation. Pancreas: No pancreatic ductal dilation or evidence of acute inflammation. Spleen: No splenomegaly. Adrenals/Urinary Tract: Similar lobular thickening of the left adrenal gland which did not demonstrate abnormal FDG avidity on PET-CT and is most consistent with hyperplasia/benign tiny adenomas requiring no independent imaging follow-up. Right adrenal gland appears normal. Hydronephrosis. Nonobstructive bilateral renal stones. Urinary bladder is unremarkable for degree of distension. Stomach/Bowel: Radiopaque enteric contrast material traverses the rectum. Similar surgical change of prior gastric bypass. Stomach is otherwise unremarkable for degree of distension. No pathologic  dilation of small or large bowel. No evidence of acute bowel inflammation. Vascular/Lymphatic: Aortic atherosclerosis. Prominent/mildly enlarged aortocaval, periportal and gastrohepatic ligament lymph nodes are similar prior examinations and were not hypermetabolic on recent PET-CT for instance a 12 mm aortocaval on image 61/3 and an 8 mm gastrohepatic ligament lymph node on image 54/3. Reproductive: Calcified uterine leiomyomas. No suspicious adnexal mass. Other: No significant abdominopelvic free fluid. No discrete peritoneal or omental nodularity. Musculoskeletal: No aggressive lytic or blastic lesion of bone. Multilevel degenerative changes spine. IMPRESSION: 1. Increased size of the metastatic hepatic lesions. 2. Stable size of the right subpectoral lymph node which demonstrated mild hypermetabolism on prior PET-CT. 3. No new sites of possible metastatic disease identified within the chest, abdomen or pelvis. 4. Stable prominent/mildly enlarged upper abdominal lymph nodes which were not hypermetabolic on prior PET-CT and favored to reflect a benign reactive process. Attention on follow-up imaging suggested. 5. Prior right mastectomy and right axillary lymph node dissection. 6. Bilateral nonobstructive renal stones. 7.  Aortic Atherosclerosis (ICD10-I70.0). Electronically Signed   By: Dahlia Bailiff M.D.   On: 08/20/2021 09:29    Labs:  CBC: Recent Labs    06/20/21 0927 07/11/21 0907 07/31/21 1419 08/17/21 1040  WBC 4.4 3.5* 4.7 4.8  HGB 10.9* 10.3* 10.0* 10.1*  HCT 32.4* 30.4* 29.1* 29.1*  PLT 159 183 190 174    COAGS: No results for input(s): "INR", "APTT" in the last 8760 hours.  BMP: Recent Labs    07/11/21 0907 07/30/21 1204 07/31/21 1419 08/17/21 1040  NA 142 140 140 141  K 4.7 4.7 4.8 4.5  CL 108 106 107 109  CO2 _0 GLUCOSE 101* 84 114* 199*  BUN 42* 40* 47* 47*  CALCIUM 9.6 8.7* 8.8* 9.0  CREATININE 2.02* 1.95* 2.15* 2.13*  GFRNONAA 26* 28* 24* 25*    LIVER  FUNCTION TESTS: Recent Labs    06/20/21 0927 07/11/21 0907 07/31/21 1419 08/17/21 1040  BILITOT 0.5 0.4 0.4 0.4  AST _1 ALT _2 ALKPHOS 51 49 51 56  PROT 6.3* 6.3* 6.1* 6.4*  ALBUMIN 3.9 3.9 3.8 3.8    TUMOR MARKERS: No results for input(s): "AFPTM", "CEA", "CA199", "CHROMGRNA" in the last 8760 hours.  Assessment and Plan: 71 y.o. female with past medical history significant for anemia, anxiety, arthritis, CHF, chronic kidney disease, depression, diabetes, fatty liver, hypertension, hyperlipidemia, sleep apnea, and right breast cancer in 2016, status post bilat mastectomies/chemoradiation. F/u CT on 08/20/21 revealed:  1. Increased size of the metastatic hepatic lesions. 2. Stable size of the right subpectoral lymph node which demonstrated mild hypermetabolism on prior PET-CT. 3. No new sites of possible metastatic disease identified within the chest, abdomen or pelvis. 4. Stable prominent/mildly enlarged upper abdominal lymph nodes which were not hypermetabolic on prior PET-CT and favored to reflect a benign reactive process. Attention on follow-up imaging suggested. 5. Prior right mastectomy and right axillary lymph node dissection. 6. Bilateral nonobstructive renal stones. 7.  Aortic Atherosclerosis  She presents today for image guided liver mass biopsy for further evaluation.Risks and benefits of procedure was discussed with the patient  including, but not limited to bleeding, infection, damage to adjacent structures or low yield requiring additional tests.  All of the questions were answered and there is agreement to proceed.  Consent signed and in chart.    Thank you for this interesting consult.  I greatly enjoyed meeting Shirley Decamp [place] and look forward to participating in their care.  A copy of this report was sent to the requesting provider on this date.  Electronically Signed: D. Rowe Robert, PA-C 08/30/2021, 12:16 PM   I spent a  total of  25 minutes   in face to face in clinical consultation, greater than 50% of which was counseling/coordinating care for image guided liver mass biopsy

## 2021-08-31 ENCOUNTER — Inpatient Hospital Stay: Payer: Medicare PPO

## 2021-08-31 ENCOUNTER — Inpatient Hospital Stay (HOSPITAL_BASED_OUTPATIENT_CLINIC_OR_DEPARTMENT_OTHER): Payer: Medicare PPO | Admitting: Hematology and Oncology

## 2021-08-31 ENCOUNTER — Encounter: Payer: Self-pay | Admitting: *Deleted

## 2021-08-31 VITALS — BP 148/67 | HR 54 | Temp 98.4°F | Resp 16

## 2021-08-31 DIAGNOSIS — C50411 Malignant neoplasm of upper-outer quadrant of right female breast: Secondary | ICD-10-CM

## 2021-08-31 DIAGNOSIS — Z171 Estrogen receptor negative status [ER-]: Secondary | ICD-10-CM | POA: Diagnosis not present

## 2021-08-31 DIAGNOSIS — R5383 Other fatigue: Secondary | ICD-10-CM | POA: Diagnosis not present

## 2021-08-31 DIAGNOSIS — C50811 Malignant neoplasm of overlapping sites of right female breast: Secondary | ICD-10-CM | POA: Diagnosis not present

## 2021-08-31 DIAGNOSIS — Z95828 Presence of other vascular implants and grafts: Secondary | ICD-10-CM

## 2021-08-31 DIAGNOSIS — Z5112 Encounter for antineoplastic immunotherapy: Secondary | ICD-10-CM | POA: Diagnosis not present

## 2021-08-31 DIAGNOSIS — C787 Secondary malignant neoplasm of liver and intrahepatic bile duct: Secondary | ICD-10-CM | POA: Diagnosis not present

## 2021-08-31 DIAGNOSIS — Z452 Encounter for adjustment and management of vascular access device: Secondary | ICD-10-CM | POA: Diagnosis not present

## 2021-08-31 DIAGNOSIS — D6481 Anemia due to antineoplastic chemotherapy: Secondary | ICD-10-CM | POA: Diagnosis not present

## 2021-08-31 LAB — CEA (IN HOUSE-CHCC): CEA (CHCC-In House): 617.17 ng/mL — ABNORMAL HIGH (ref 0.00–5.00)

## 2021-08-31 LAB — PHOSPHORUS: Phosphorus: 4 mg/dL (ref 2.5–4.6)

## 2021-08-31 LAB — MAGNESIUM: Magnesium: 2.2 mg/dL (ref 1.7–2.4)

## 2021-08-31 MED ORDER — ATROPINE SULFATE 1 MG/ML IV SOLN
INTRAVENOUS | Status: AC
  Filled 2021-08-31: qty 1

## 2021-08-31 MED ORDER — HEPARIN SOD (PORK) LOCK FLUSH 100 UNIT/ML IV SOLN
500.0000 [IU] | Freq: Once | INTRAVENOUS | Status: AC | PRN
  Administered 2021-08-31: 500 [IU]

## 2021-08-31 MED ORDER — ACETAMINOPHEN 325 MG PO TABS
ORAL_TABLET | ORAL | Status: AC
  Filled 2021-08-31: qty 2

## 2021-08-31 MED ORDER — SODIUM CHLORIDE 0.9% FLUSH
10.0000 mL | Freq: Once | INTRAVENOUS | Status: AC
  Administered 2021-08-31: 10 mL

## 2021-08-31 MED ORDER — SODIUM CHLORIDE 0.9 % IV SOLN
540.0000 mg | Freq: Once | INTRAVENOUS | Status: AC
  Administered 2021-08-31: 540 mg via INTRAVENOUS
  Filled 2021-08-31: qty 54

## 2021-08-31 MED ORDER — DIPHENHYDRAMINE HCL 50 MG/ML IJ SOLN
INTRAMUSCULAR | Status: AC
  Filled 2021-08-31: qty 1

## 2021-08-31 MED ORDER — ATROPINE SULFATE 1 MG/ML IV SOLN: 0.5000 mg | Freq: Once | INTRAVENOUS | Status: DC | PRN

## 2021-08-31 MED ORDER — DIPHENHYDRAMINE HCL 50 MG/ML IJ SOLN
50.0000 mg | Freq: Once | INTRAMUSCULAR | Status: AC
  Administered 2021-08-31: 50 mg via INTRAVENOUS

## 2021-08-31 MED ORDER — SODIUM CHLORIDE 0.9 % IV SOLN
150.0000 mg | Freq: Once | INTRAVENOUS | Status: AC
  Administered 2021-08-31: 150 mg via INTRAVENOUS
  Filled 2021-08-31: qty 150

## 2021-08-31 MED ORDER — SODIUM CHLORIDE 0.9 % IV SOLN
10.0000 mg | Freq: Once | INTRAVENOUS | Status: AC
  Administered 2021-08-31: 10 mg via INTRAVENOUS
  Filled 2021-08-31: qty 10

## 2021-08-31 MED ORDER — SODIUM CHLORIDE 0.9 % IV SOLN: Freq: Once | INTRAVENOUS | Status: AC

## 2021-08-31 MED ORDER — FAMOTIDINE IN NACL 20-0.9 MG/50ML-% IV SOLN
INTRAVENOUS | Status: AC
  Filled 2021-08-31: qty 50

## 2021-08-31 MED ORDER — FAMOTIDINE IN NACL 20-0.9 MG/50ML-% IV SOLN
20.0000 mg | Freq: Once | INTRAVENOUS | Status: AC
  Administered 2021-08-31: 20 mg via INTRAVENOUS

## 2021-08-31 MED ORDER — PALONOSETRON HCL INJECTION 0.25 MG/5ML
0.2500 mg | Freq: Once | INTRAVENOUS | Status: AC
  Administered 2021-08-31: 0.25 mg via INTRAVENOUS

## 2021-08-31 MED ORDER — SODIUM CHLORIDE 0.9% FLUSH
10.0000 mL | INTRAVENOUS | Status: DC | PRN
  Administered 2021-08-31: 10 mL

## 2021-08-31 MED ORDER — ACETAMINOPHEN 325 MG PO TABS
650.0000 mg | ORAL_TABLET | Freq: Once | ORAL | Status: AC
  Administered 2021-08-31: 650 mg via ORAL

## 2021-08-31 MED ORDER — PALONOSETRON HCL INJECTION 0.25 MG/5ML
INTRAVENOUS | Status: AC
  Filled 2021-08-31: qty 5

## 2021-08-31 NOTE — Assessment & Plan Note (Addendum)
07/19/2014: Right breast cancer grade 2 IDC ER/PR negative HER2 positive Ki-67 90% treated with neoadjuvant chemo with TCHP x6 cycles followed by Herceptin maintenance, right lumpectomy had residual disease 4 cm 1/3 lymph node positive, ER/PR negative, adjuvant radiation and capecitabine completed 09/27/2015 12/06/2015: Recurrence: Right subpectoral lymph node and subcutaneous nodule, liver mass (biopsy adenocarcinoma consistent with upper GI or pancreaticobiliary primary, HER2 negative, ER negative) July 2019: Metastatic disease: TDM 1 followed by Herceptin maintenance,pertuzumabadded 10/06/2018, Abraxane 12/31/2017-09/01/2018, Herceptin and pertuzumab maintenance  Priortreatment: Enhertu started 9/15/2022switched to Herceptin maintenance 02/19/2021 switched to Enhertu at higher dosage x 3 cycles discontinued 08/01/2021   02/04/2021: PET CT scan: Complete metabolic response, no evidence of hypermetabolic disease in the liver (mass measured 2.7 cm previously is now 1.5 cm with no uptake)  08/20/2021: CT CAP: Increased size of the metastatic liver lesions went from 6 cm to 7.5 cm and hepatic dome lesion is now 17 mm previously 10 mm, stable size of the right subpectoral lymph node, no new sites of metastatic disease, stable upper abdominal lymph nodes probably benign  ------------------------------------------------------------------------------------------------------------------------------------------- Current treatment: Darlene [name] cycle 1 day 1 Liver biopsy was performed 08/30/2021 Molecular testing will need to be performed  Return to clinic in 1 week for cycle 1 day 8 and toxicity check

## 2021-08-31 NOTE — Progress Notes (Signed)
Per MD request, RN successfully faxed [facility] testing request [phone]).

## 2021-08-31 NOTE — Patient Instructions (Signed)
[facility]  Discharge Instructions: Thank you for choosing [facility] to provide your oncology and hematology care.   If you have a lab appointment with the [facility], please go directly to the [facility] and check in at the registration area.   Wear comfortable clothing and clothing appropriate for easy access to any Portacath or PICC line.   We strive to give you quality time with your provider. You may need to reschedule your appointment if you arrive late (15 or more minutes).  Arriving late affects you and other patients whose appointments are after yours.  Also, if you miss three or more appointments without notifying the office, you may be dismissed from the clinic at the provider's discretion.      For prescription refill requests, have your pharmacy contact our office and allow 72 hours for refills to be completed.    Today you received the following chemotherapy and/or immunotherapy agents: Sacituzumab govitecan-hziy Ivette Loyal)    To help prevent nausea and vomiting after your treatment, we encourage you to take your nausea medication as directed.  BELOW ARE SYMPTOMS THAT SHOULD BE REPORTED IMMEDIATELY: *FEVER GREATER THAN 100.4 F (38 C) OR HIGHER *CHILLS OR SWEATING *NAUSEA AND VOMITING THAT IS NOT CONTROLLED WITH YOUR NAUSEA MEDICATION *UNUSUAL SHORTNESS OF BREATH *UNUSUAL BRUISING OR BLEEDING *URINARY PROBLEMS (pain or burning when urinating, or frequent urination) *BOWEL PROBLEMS (unusual diarrhea, constipation, pain near the anus) TENDERNESS IN MOUTH AND THROAT WITH OR WITHOUT PRESENCE OF ULCERS (sore throat, sores in mouth, or a toothache) UNUSUAL RASH, SWELLING OR PAIN  UNUSUAL VAGINAL DISCHARGE OR ITCHING   Items with * indicate a potential emergency and should be followed up as soon as possible or go to the Emergency Department if any problems should occur.  Please show the CHEMOTHERAPY ALERT CARD or IMMUNOTHERAPY  ALERT CARD at check-in to the Emergency Department and triage nurse.  Should you have questions after your visit or need to cancel or reschedule your appointment, please contact [facility]  Dept: [phone]  and follow the prompts.  Office hours are 8:00 a.m. to 4:30 p.m. Monday - Friday. Please note that voicemails left after 4:00 p.m. may not be returned until the following business day.  We are closed weekends and major holidays. You have access to a nurse at all times for urgent questions. Please call the main number to the clinic Dept: [phone] and follow the prompts.   For any non-urgent questions, you may also contact your provider using MyChart. We now offer e-Visits for anyone 47 and older to request care online for non-urgent symptoms. For details visit [email or web address].   Also download the MyChart app! Go to the app store, search "MyChart", open the app, select [facility], and log in with your MyChart username and password.  Masks are optional in the cancer centers. If you would like for your care team to wear a mask while they are taking care of you, please let them know. You may have one support person who is at least 69 years old accompany you for your appointments.

## 2021-08-31 NOTE — Progress Notes (Signed)
Ok to treat with serum creatinine of 1.89 per Dr. Lindi Adie.

## 2021-08-31 NOTE — Progress Notes (Signed)
Pt tolerated 1st treatment well. Monitored for 30 minute post observation period without incident. VSS, ambulatory to lobby.

## 2021-09-03 ENCOUNTER — Ambulatory Visit (HOSPITAL_COMMUNITY)
Admission: RE | Admit: 2021-09-03 | Discharge: 2021-09-03 | Disposition: A | Discharge status: Home / Self Care | Payer: Medicare PPO | Source: Ambulatory Visit | Attending: Physician Assistant | Admitting: Physician Assistant

## 2021-09-03 ENCOUNTER — Inpatient Hospital Stay (HOSPITAL_BASED_OUTPATIENT_CLINIC_OR_DEPARTMENT_OTHER): Payer: Medicare PPO | Admitting: Physician Assistant

## 2021-09-03 ENCOUNTER — Encounter: Payer: Self-pay | Admitting: Hematology and Oncology

## 2021-09-03 ENCOUNTER — Telehealth: Payer: Self-pay

## 2021-09-03 ENCOUNTER — Telehealth: Payer: Self-pay | Admitting: *Deleted

## 2021-09-03 ENCOUNTER — Other Ambulatory Visit: Payer: Self-pay

## 2021-09-03 ENCOUNTER — Inpatient Hospital Stay: Payer: Medicare PPO

## 2021-09-03 VITALS — BP 132/62 | HR 58 | Temp 98.4°F | Resp 16 | Wt 176.5 lb

## 2021-09-03 DIAGNOSIS — R079 Chest pain, unspecified: Secondary | ICD-10-CM | POA: Diagnosis not present

## 2021-09-03 DIAGNOSIS — R109 Unspecified abdominal pain: Secondary | ICD-10-CM | POA: Diagnosis not present

## 2021-09-03 DIAGNOSIS — R0689 Other abnormalities of breathing: Secondary | ICD-10-CM

## 2021-09-03 DIAGNOSIS — R5383 Other fatigue: Secondary | ICD-10-CM | POA: Diagnosis not present

## 2021-09-03 DIAGNOSIS — Z171 Estrogen receptor negative status [ER-]: Secondary | ICD-10-CM | POA: Diagnosis not present

## 2021-09-03 DIAGNOSIS — C50411 Malignant neoplasm of upper-outer quadrant of right female breast: Secondary | ICD-10-CM

## 2021-09-03 DIAGNOSIS — C50811 Malignant neoplasm of overlapping sites of right female breast: Secondary | ICD-10-CM | POA: Diagnosis not present

## 2021-09-03 DIAGNOSIS — C787 Secondary malignant neoplasm of liver and intrahepatic bile duct: Secondary | ICD-10-CM | POA: Diagnosis not present

## 2021-09-03 DIAGNOSIS — D6481 Anemia due to antineoplastic chemotherapy: Secondary | ICD-10-CM | POA: Diagnosis not present

## 2021-09-03 DIAGNOSIS — Z95828 Presence of other vascular implants and grafts: Secondary | ICD-10-CM | POA: Diagnosis not present

## 2021-09-03 DIAGNOSIS — Z5112 Encounter for antineoplastic immunotherapy: Secondary | ICD-10-CM | POA: Diagnosis not present

## 2021-09-03 DIAGNOSIS — Z452 Encounter for adjustment and management of vascular access device: Secondary | ICD-10-CM | POA: Diagnosis not present

## 2021-09-03 LAB — CBC WITH DIFFERENTIAL/PLATELET
Abs Immature Granulocytes: 0.02 10*3/uL (ref 0.00–0.07)
Basophils Absolute: 0 10*3/uL (ref 0.0–0.1)
Basophils Relative: 0 %
Eosinophils Absolute: 0.2 10*3/uL (ref 0.0–0.5)
Eosinophils Relative: 3 %
HCT: 30.2 % — ABNORMAL LOW (ref 36.0–46.0)
Hemoglobin: 10.4 g/dL — ABNORMAL LOW (ref 12.0–15.0)
Immature Granulocytes: 0 %
Lymphocytes Relative: 14 %
Lymphs Abs: 0.8 10*3/uL (ref 0.7–4.0)
MCH: 33.8 pg (ref 26.0–34.0)
MCHC: 34.4 g/dL (ref 30.0–36.0)
MCV: 98.1 fL (ref 80.0–100.0)
Monocytes Absolute: 0.2 10*3/uL (ref 0.1–1.0)
Monocytes Relative: 3 %
Neutro Abs: 4.9 10*3/uL (ref 1.7–7.7)
Neutrophils Relative %: 80 %
Platelets: 153 10*3/uL (ref 150–400)
RBC: 3.08 MIL/uL — ABNORMAL LOW (ref 3.87–5.11)
RDW: 14.4 % (ref 11.5–15.5)
WBC: 6.1 10*3/uL (ref 4.0–10.5)
nRBC: 0 % (ref 0.0–0.2)

## 2021-09-03 LAB — COMPREHENSIVE METABOLIC PANEL
ALT: 14 U/L (ref 0–44)
AST: 30 U/L (ref 15–41)
Albumin: 3.8 g/dL (ref 3.5–5.0)
Alkaline Phosphatase: 62 U/L (ref 38–126)
Anion gap: 4 — ABNORMAL LOW (ref 5–15)
BUN: 53 mg/dL — ABNORMAL HIGH (ref 8–23)
CO2: 29 mmol/L (ref 22–32)
Calcium: 9 mg/dL (ref 8.9–10.3)
Chloride: 104 mmol/L (ref 98–111)
Creatinine, Ser: 1.65 mg/dL — ABNORMAL HIGH (ref 0.44–1.00)
GFR, Estimated: 34 mL/min — ABNORMAL LOW (ref >60)
Glucose, Bld: 170 mg/dL — ABNORMAL HIGH (ref 70–99)
Potassium: 4.8 mmol/L (ref 3.5–5.1)
Sodium: 137 mmol/L (ref 135–145)
Total Bilirubin: 0.6 mg/dL (ref 0.3–1.2)
Total Protein: 6.2 g/dL — ABNORMAL LOW (ref 6.5–8.1)

## 2021-09-03 MED ORDER — HYDROCODONE-ACETAMINOPHEN 5-325 MG PO TABS: 1.0000 | ORAL_TABLET | Freq: Four times a day (QID) | ORAL | 0 refills | Status: DC | PRN

## 2021-09-03 MED ORDER — SODIUM CHLORIDE 0.9% FLUSH
10.0000 mL | Freq: Once | INTRAVENOUS | Status: AC
  Administered 2021-09-03: 10 mL

## 2021-09-03 MED ORDER — HEPARIN SOD (PORK) LOCK FLUSH 100 UNIT/ML IV SOLN
500.0000 [IU] | Freq: Once | INTRAVENOUS | Status: AC
  Administered 2021-09-03: 500 [IU]

## 2021-09-03 NOTE — Telephone Encounter (Signed)
RN called patient regarding mychart message about pain associated with her liver biopsy. Patient has been scheduled for flush w/ lab at 11 and [facility] at 1130. Patient verbalized understanding and thanks.

## 2021-09-03 NOTE — Telephone Encounter (Signed)
Pt not contacted post chemo since seen by Symptom Management team today for pain.

## 2021-09-03 NOTE — Telephone Encounter (Signed)
S/w Apolonio Schneiders, RN regarding pt needing symptom management appt today. Apolonio Schneiders will call pt to set up.

## 2021-09-03 NOTE — Patient Instructions (Addendum)
-  Use the incentive spirometer to encourage deep breathing and full lung expansion as we discussed.  Try to pull 1000-1500.   -Take Tylenol for mild to moderate pain.  If still having severe pain you can try taking the [place] contains 325 mg of Tylenol.  Make sure you are not taking more than 4 g of Tylenol per day.  -Try brace yourself with a pillow when coughing or sneezing.  -You can try sleeping on pillows on your right side.  You can apply heating pad to your neck.  -Should symptoms worsen please seek ER evaluation.

## 2021-09-03 NOTE — Progress Notes (Signed)
Symptom Management Consult note [facility]    Patient Care Team: Isaac Bliss, Rayford Halsted, MD as PCP - General (Internal Medicine) Larey Dresser, MD as PCP - Advanced Heart Failure (Cardiology) Stark Klein, MD as Consulting Physician (General Surgery) Jacelyn Pi, MD as Consulting Physician (Endocrinology) [name], MD as Consulting Physician (Plastic Surgery) Gery Pray, MD as Consulting Physician (Radiation Oncology) Juanita Craver, MD as Consulting Physician (Gastroenterology) Alda Berthold, DO as Consulting Physician (Neurology) Starlyn Skeans, MD as Consulting Physician (Family Medicine) Sherlynn Stalls, MD as Consulting Physician (Ophthalmology) Salvadore Dom, MD as Consulting Physician (Obstetrics and Gynecology) Raina Mina, RPH-CPP (Pharmacist) Nicholas Lose, MD as Consulting Physician (Hematology and Oncology)    Name of the patient: Darlene [name]  [id number]  25-Jun-1950   Date of visit: 09/03/2021    Chief complaint/ Reason for visit- pain  Oncology History  Breast cancer of upper-outer quadrant of right female breast ([place])  07/19/2014 Initial Biopsy   (R) breast needle biopsy (10:00): IDC, DCIS, grade 2. ER-, PR-, HER2+ (ratio 2.42). Ki67 90%.    07/22/2014 Initial Diagnosis   Breast cancer of upper-outer quadrant of right female breast   08/08/2014 - 11/21/2014 Neo-Adjuvant Chemotherapy   Taxotere/Carbo/Herceptin/Perjeta x 6 cycles.    12/12/2014 - 10/02/2015 Chemotherapy   Maintenance Herceptin (to complete 1 year of therapy).    12/27/2014 Surgery   (R) lumpectomy with SLNB [facility]): IDC, grade 2, spanning 4 cm, associated high grade DCIS. Negative margins. 1/3 SLN (+).   ER/PR repeated and remain negative.   ypT2, ypN1a: Stage IIB   01/06/2015 Surgery   Bilateral breast mammoplasty ([place]): Right breast path with intralymphatic emboli of ductal carcinoma. Left breast benign.    02/02/2015 Imaging    CT chest: No findings of metastatic disease in the chest. Right axillary fluid density lesion likely postoperative seroma or hematoma. Small left subpectoral nodes warrant followup attention.   02/02/2015 Imaging   Bone scan: No scintigraphic evidence of osseous metastatic disease   02/16/2015 Surgery   (R) mastectomy and ALND [facility]): Scattered foci of high grade DCIS, scattered intralymphatic tumor emboli. Margins negative. 0/15 LNs.    04/05/2015 - 05/19/2015 Radiation Therapy   Adjuvant XRT ([place]). The right chest wall was treated to 50.4 Gy in 28 fractions at 1.8 Gy per fraction.  The right mastectomy scar was treated to 10 Gy in 5 fractions at 2 Gy per fraction.    12/06/2015 PET scan   Right subpectoral lymph node measuring 9 mm and exhibits malignant range FDG uptake, suspicious for metastatic adenopathy. Subcutaneous nodule within the upper left chest wall exhibits malignant range uptake, which is indeterminate; this is at the site of previous Port-A-Cath and is favored to represent postsurgical change. Indeterminate left adrenal nodules which exhibit mild uptake, favored to represent benign adenoma.   08/11/2017 - 11/03/2017 Chemotherapy   The patient had ado-trastuzumab emtansine (KADCYLA) 320 mg in sodium chloride 0.9 % 250 mL chemo infusion, 3.6 mg/kg = 320 mg, Intravenous, Once, 4 of 5 cycles Administration: 320 mg (08/12/2017), 320 mg (09/01/2017), 320 mg (09/23/2017)  for chemotherapy treatment.    11/18/2017 - 08/30/2020 Chemotherapy   The patient had trastuzumab (HERCEPTIN) 546 mg in sodium chloride 0.9 % 250 mL chemo infusion, 6 mg/kg = 546 mg (100 % of original dose 6 mg/kg), Intravenous,  Once, 2 of 2 cycles Dose modification: 6 mg/kg (original dose 6 mg/kg, Cycle 1, Reason: Provider Judgment) Administration: 546 mg (11/18/2017),  546 mg (12/16/2017) pertuzumab (PERJETA) 420 mg in sodium chloride 0.9 % 250 mL chemo infusion, 420 mg (100 % of original dose 420 mg), Intravenous,  Once, 15 of 15 cycles Dose modification: 420 mg (original dose 420 mg, Cycle 18), 420 mg (original dose 420 mg, Cycle 27) Administration: 420 mg (10/06/2018), 420 mg (10/27/2018), 420 mg (11/18/2018), 420 mg (12/08/2018), 420 mg (12/29/2018), 420 mg (01/19/2019), 420 mg (02/16/2019), 420 mg (03/16/2019), 420 mg (04/13/2019), 420 mg (06/08/2019), 420 mg (07/06/2019), 420 mg (08/03/2019), 420 mg (08/31/2019), 420 mg (09/28/2019), 420 mg (05/19/2019) trastuzumab-anns (KANJINTI) 546 mg in sodium chloride 0.9 % 250 mL chemo infusion, 6 mg/kg = 546 mg (100 % of original dose 6 mg/kg), Intravenous,  Once, 30 of 30 cycles Dose modification: 6 mg/kg (original dose 6 mg/kg, Cycle 3, Reason: Other (see comments), Comment: requested step therapy by [facility] insurance), 4 mg/kg (original dose 6 mg/kg, Cycle 9, Reason: Provider Judgment), 6 mg/kg (original dose 6 mg/kg, Cycle 17, Reason: Provider Judgment), 4 mg/kg (original dose 6 mg/kg, Cycle 27, Reason: Provider Judgment), 6 mg/kg (original dose 6 mg/kg, Cycle 27, Reason: Provider Judgment) Administration: 546 mg (01/13/2018), 546 mg (02/10/2018), 546 mg (03/03/2018), 546 mg (03/24/2018), 546 mg (04/14/2018), 546 mg (05/05/2018), 357 mg (05/26/2018), 357 mg (06/09/2018), 357 mg (06/23/2018), 357 mg (07/07/2018), 357 mg (07/21/2018), 357 mg (08/04/2018), 357 mg (08/18/2018), 357 mg (09/01/2018), 546 mg (09/15/2018), 546 mg (10/06/2018), 546 mg (10/27/2018), 546 mg (11/18/2018), 546 mg (12/08/2018), 546 mg (12/29/2018), 546 mg (01/19/2019), 546 mg (02/16/2019), 480 mg (03/16/2019), 483 mg (04/13/2019), 450 mg (06/08/2019), 450 mg (07/06/2019), 450 mg (08/03/2019), 450 mg (08/31/2019), 450 mg (09/28/2019)  for chemotherapy treatment.    12/31/2017 - 09/01/2018 Chemotherapy   The patient had PACLitaxel-protein bound (ABRAXANE) chemo infusion 175 mg, 80 mg/m2 = 175 mg (100 % of original dose 80 mg/m2), Intravenous,  Once, 5 of 10 cycles Dose modification: 80 mg/m2 (original dose 80 mg/m2, Cycle 11) Administration: 175  mg (07/07/2018), 175 mg (07/21/2018), 175 mg (08/04/2018), 175 mg (08/18/2018), 175 mg (09/01/2018) PACLitaxel-protein bound (ABRAXANE) chemo infusion 200 mg, 100 mg/m2 = 200 mg, Intravenous, Once, 10 of 10 cycles Dose modification: 80 mg/m2 (original dose 100 mg/m2, Cycle 2, Reason: Provider Judgment), 80 mg/m2 (original dose 100 mg/m2, Cycle 2, Reason: Provider Judgment) Administration: 200 mg (12/31/2017), 200 mg (01/06/2018), 175 mg (01/20/2018), 175 mg (01/27/2018), 175 mg (02/10/2018), 175 mg (03/03/2018), 175 mg (03/10/2018), 175 mg (03/24/2018), 175 mg (04/07/2018), 175 mg (04/14/2018), 175 mg (04/28/2018), 175 mg (05/05/2018), 175 mg (05/26/2018), 175 mg (06/09/2018), 175 mg (05/19/2018)  for chemotherapy treatment.    10/05/2020 - 01/18/2021 Chemotherapy   Patient is on Treatment Plan : BREAST METASTATIC fam-trastuzumab deruxtecan-nxki (Enhertu) q21d     02/19/2021 - 05/14/2021 Chemotherapy   Patient is on Treatment Plan : BREAST Trastuzumab q21d x 13 cycles     06/20/2021 - 08/01/2021 Chemotherapy   Patient is on Treatment Plan : BREAST METASTATIC fam-trastuzumab deruxtecan-nxki (Enhertu) q21d     08/31/2021 -  Chemotherapy   Patient is on Treatment Plan : BREAST METASTATIC Sacituzumab govitecan-hziy Ivette Loyal) q21d     Metastasis to liver ([place])  08/04/2017 Initial Diagnosis   Metastasis to liver ([place])   08/12/2017 - 10/14/2017 Chemotherapy   The patient had ado-trastuzumab emtansine (KADCYLA) 320 mg in sodium chloride 0.9 % 250 mL chemo infusion, 3.6 mg/kg = 320 mg, Intravenous, Once, 4 of 5 cycles Administration: 320 mg (08/12/2017), 320 mg (09/01/2017), 320 mg (09/23/2017)  for chemotherapy treatment.  11/18/2017 - 08/30/2020 Chemotherapy   The patient had trastuzumab (HERCEPTIN) 546 mg in sodium chloride 0.9 % 250 mL chemo infusion, 6 mg/kg = 546 mg (100 % of original dose 6 mg/kg), Intravenous,  Once, 2 of 2 cycles Dose modification: 6 mg/kg (original dose 6 mg/kg, Cycle 1, Reason: Provider  Judgment) Administration: 546 mg (11/18/2017), 546 mg (12/16/2017) pertuzumab (PERJETA) 420 mg in sodium chloride 0.9 % 250 mL chemo infusion, 420 mg (100 % of original dose 420 mg), Intravenous, Once, 15 of 15 cycles Dose modification: 420 mg (original dose 420 mg, Cycle 18), 420 mg (original dose 420 mg, Cycle 27) Administration: 420 mg (10/06/2018), 420 mg (10/27/2018), 420 mg (11/18/2018), 420 mg (12/08/2018), 420 mg (12/29/2018), 420 mg (01/19/2019), 420 mg (02/16/2019), 420 mg (03/16/2019), 420 mg (04/13/2019), 420 mg (06/08/2019), 420 mg (07/06/2019), 420 mg (08/03/2019), 420 mg (08/31/2019), 420 mg (09/28/2019), 420 mg (05/19/2019) trastuzumab-anns (KANJINTI) 546 mg in sodium chloride 0.9 % 250 mL chemo infusion, 6 mg/kg = 546 mg (100 % of original dose 6 mg/kg), Intravenous,  Once, 30 of 30 cycles Dose modification: 6 mg/kg (original dose 6 mg/kg, Cycle 3, Reason: Other (see comments), Comment: requested step therapy by [facility] insurance), 4 mg/kg (original dose 6 mg/kg, Cycle 9, Reason: Provider Judgment), 6 mg/kg (original dose 6 mg/kg, Cycle 17, Reason: Provider Judgment), 4 mg/kg (original dose 6 mg/kg, Cycle 27, Reason: Provider Judgment), 6 mg/kg (original dose 6 mg/kg, Cycle 27, Reason: Provider Judgment) Administration: 546 mg (01/13/2018), 546 mg (02/10/2018), 546 mg (03/03/2018), 546 mg (03/24/2018), 546 mg (04/14/2018), 546 mg (05/05/2018), 357 mg (05/26/2018), 357 mg (06/09/2018), 357 mg (06/23/2018), 357 mg (07/07/2018), 357 mg (07/21/2018), 357 mg (08/04/2018), 357 mg (08/18/2018), 357 mg (09/01/2018), 546 mg (09/15/2018), 546 mg (10/06/2018), 546 mg (10/27/2018), 546 mg (11/18/2018), 546 mg (12/08/2018), 546 mg (12/29/2018), 546 mg (01/19/2019), 546 mg (02/16/2019), 480 mg (03/16/2019), 483 mg (04/13/2019), 450 mg (06/08/2019), 450 mg (07/06/2019), 450 mg (08/03/2019), 450 mg (08/31/2019), 450 mg (09/28/2019)  for chemotherapy treatment.      Current Therapy: Ivette Loyal  Last treatment:  08/31/21    Day 1   Cycle   1  Interval history- Dachelle Molzahn is a 71 y.o. with oncologic history as above presenting to [facility] today with chief complaint of pain after her recent liver biopsy.  Patient had liver biopsy performed 08/30/2021.  Patient is reporting pain in her right lower abdomen and pain when taking a deep breath.  She denies it being chest pain and states it feels like her lungs hurt. She states this has been ongoing x4 days.  The pain for started the evening of her procedure.  She describes it as aching.  Pain radiates to her right shoulder.  She states the pain is intermittent.  Pain is currently 4 out of 10 in severity.  Patient has been taking Tylenol with symptom improvement.  She had difficulty sleeping because she usually lays on her right side and was unable to because of the pain.  She has been drinking plenty of fluids and thinks her appetite is slightly decreased although she is tolerating food without difficulty.  She reports being told that she would have this pain after the biopsy.  She became concerned because it was continuing on which prompted her to get checked out today.  She denies any fever, chills, cough, shortness of breath, back pain, urinary symptoms.  Patient has a history of constipation and diarrhea.  She is currently taking MiraLAX and her last bowel movement  was yesterday and was normal.     ROS  All other systems are reviewed and are negative for acute change except as noted in the HPI.    Allergies  Allergen Reactions   Nsaids Other (See Comments)    H/o gastric bypass - avoid NSAIDs   Tape Hives, Rash and Other (See Comments)    Adhesives in bandaids     Past Medical History:  Diagnosis Date   Anemia    hx of - during 1st round chemo   Anxiety    Arthritis    in fingers, shoulders   Back pain    Balance problem    Breast cancer (HCC)    Breast cancer of upper-outer quadrant of right female breast ([place]) 07/22/2014   Bunion, right foot    CHF (congestive heart  failure) (HCC)    Chronic kidney disease    creatininei levels high per pt    Clumsiness    Constipation    Depression    Diabetes mellitus without complication ([place])    89+ years type 2    Diabetic retinopathy ([place])    torn retina   Eating disorder    binge eating   Epistaxis 08/26/2014   Fatty liver    Foot drop, left foot    HCAP (healthcare-associated pneumonia) 12/18/2014   Heart murmur    never had any problems   History of radiation therapy 04/05/15-05/19/15   right chest wall was treated to 50.4 Gy in 28 fractions, right mastectomy scar was treated to 10 Gy in 5 fractions   Hyperlipidemia    Hypertension    Joint pain    Multiple food allergies    Neuromuscular disorder ([place])    diabetic neuropathy   Obesity    Obstructive sleep apnea on CPAP    does not use cpap all the time has lost 160 lbs    Ovarian cyst rupture    possible   Pneumonia    Pneumonia 11/2014   Radiation 04/05/15-05/19/15   right chest wall 50.4 Gy, mastectomy scar 10 Gy   Sleep apnea    Thyroid nodule 06/2014     Past Surgical History:  Procedure Laterality Date   ANAL RECTAL MANOMETRY N/A 08/01/2021   Procedure: ANO RECTAL MANOMETRY;  Surgeon: Thornton Park, MD;  Location: [facility];  Service: Gastroenterology;  Laterality: N/A;   APPENDECTOMY     BIOPSY THYROID Bilateral 06/2014   2 nodules - Benign    BREAST LUMPECTOMY WITH RADIOACTIVE SEED AND SENTINEL LYMPH NODE BIOPSY Right 12/27/2014   Procedure: BREAST LUMPECTOMY WITH RADIOACTIVE SEED AND SENTINEL LYMPH NODE BIOPSY;  Surgeon: Stark Klein, MD;  Location: [place];  Service: General;  Laterality: Right;   BREAST REDUCTION SURGERY Bilateral 01/06/2015   Procedure: BILATERAL BREAST REDUCTION, RIGHT ONCOPLASTIC RECONSTRUCTION),LEFT BREAST REDUCTION FOR SYMMETRY;  Surgeon: [name], MD;  Location: [place];  Service: Plastics;  Laterality: Bilateral;   BREATH TEK H PYLORI N/A 09/15/2012   Procedure: BREATH TEK H  PYLORI;  Surgeon: Pedro Earls, MD;  Location: [facility];  Service: General;  Laterality: N/A;   BUNIONECTOMY Left 12/2013   CARPAL TUNNEL RELEASE Right    CARPAL TUNNEL RELEASE Left    CATARACT EXTRACTION     6/19   COLONOSCOPY     DUPUYTREN CONTRACTURE RELEASE     GASTRIC ROUX-EN-Y N/A 11/02/2012   Procedure: LAPAROSCOPIC ROUX-EN-Y GASTRIC;  Surgeon: Pedro Earls, MD;  [facility];  Service: General;  Laterality: N/A;   IR GENERIC HISTORICAL  03/18/2016   IR US GUIDE VASC ACCESS LEFT 03/18/2016 Markus Daft, MD [facility]   IR GENERIC HISTORICAL  03/18/2016   IR FLUORO GUIDE CV LINE LEFT 03/18/2016 Markus Daft, MD [facility]   LAPAROSCOPIC APPENDECTOMY N/A 07/22/2015   Procedure: APPENDECTOMY LAPAROSCOPIC;  Surgeon: Michael Boston, MD;  [facility];  Service: General;  Laterality: N/A;   MASTECTOMY Right 02/15/2015   modified   MODIFIED MASTECTOMY Right 02/15/2015   Procedure: RIGHT MODIFIED RADICAL MASTECTOMY;  Surgeon: Stark Klein, MD;  Location: [place];  Service: General;  Laterality: Right;   ORIF WRIST FRACTURE Left 05/11/2019   Procedure: OPEN REDUCTION INTERNAL FIXATION (ORIF) left distal radius fracture;  Surgeon: Renette Butters, MD;  Location: [facility];  Service: Orthopedics;  Laterality: Left;  with block   PORT-A-CATH REMOVAL Left 10/10/2015   Procedure: PORT REMOVAL;  Surgeon: Stark Klein, MD;  Location: [place];  Service: General;  Laterality: Left;   PORTACATH PLACEMENT Left 08/02/2014   Procedure: INSERTION PORT-A-CATH;  Surgeon: Stark Klein, MD;  Location: [place];  Service: General;  Laterality: Left;   PORTACATH PLACEMENT N/A 11/05/2016   Procedure: INSERTION PORT-A-CATH;  Surgeon: Stark Klein, MD;  Location: [place];  Service: General;  Laterality: N/A;   RETINAL LASER PROCEDURE     retina tear on left eye   ROTATOR CUFF REPAIR Right 2005   SCAR REVISION Right 12/08/2015   Procedure: COMPLEX REPAIR RIGHT CHEST 10-15CM;  Surgeon: [name], MD;  Location: [place];  Service: Plastics;  Laterality: Right;   TOE SURGERY  1970s   bone spur   TRIGGER FINGER RELEASE     x3    Social History   Socioeconomic History   Marital status: Married    Spouse name: [name]   Number of children: 0   Years of education: Bachelor's   Highest education level: Bachelor's degree (e.g., BA, AB, BS)  Occupational History   Occupation: Pensions consultant: [employer]  Tobacco Use   Smoking status: Never   Smokeless tobacco: Never  Vaping Use   Vaping Use: Never used  Substance and Sexual Activity   Alcohol use: Yes    Alcohol/week: 1.0 - 2.0 standard drink of alcohol    Types: 1 - 2 Standard drinks or equivalent per week    Comment: social   Drug use: No   Sexual activity: Yes    Partners: Male    Birth control/protection: Post-menopausal, Other-see comments    Comment: husband vasectomy  Other Topics Concern   Not on file  Social History Narrative   Patient is married Health and safety inspector) and lives at home with her husband.   Patient does not have any children.   Patient is working for a [employer].   Patient has a Bachelor's degree.   Patient is right-handed.   Patient does not drink any caffeine.   Social Determinants of Health   Financial Resource Strain: Low Risk  (05/19/2021)   Overall Financial Resource Strain (CARDIA)    Difficulty of Paying Living Expenses: Not hard at all  Food Insecurity: No Food Insecurity (05/19/2021)   Hunger Vital Sign    Worried About Running Out of Food in the Last Year: Never true    Ran Out of Food in the Last Year: Never true  Transportation Needs: No Transportation Needs (05/19/2021)   PRAPARE - Transportation    Lack of Transportation (  Medical): No    Lack of Transportation (Non-Medical): No  Physical Activity: Unknown (05/19/2021)   Exercise Vital Sign    Days of Exercise per Week: 1 day    Minutes of Exercise per Session: Not on file   Stress: No Stress Concern Present (05/19/2021)   [place]    Feeling of Stress : Not at all  Social Connections: [place] (05/19/2021)   Social Connection and Isolation Panel [NHANES]    Frequency of Communication with Friends and Family: Three times a week    Frequency of Social Gatherings with Friends and Family: Twice a week    Attends Religious Services: 1 to 4 times per year    Active Member of [employer] or Organizations: Yes    Attends Music therapist: More than 4 times per year    Marital Status: Married  Human resources officer Violence: Not on file    Family History  Problem Relation Age of Onset   CVA Mother    Heart disease Mother    Sudden death Mother    Diabetes Father    Heart failure Father    Hypertension Father    Kidney disease Father    Obesity Father    Hypertension Sister    Diabetes Brother    Breast cancer Paternal Grandmother 65   Diabetes Paternal Uncle    Ovarian cancer Neg Hx      Current Outpatient Medications:    HYDROcodone-acetaminophen (NORCO) 5-325 MG tablet, Take 1 tablet by mouth every 6 (six) hours as needed for severe pain., Disp: 10 tablet, Rfl: 0   Accu-Chek Softclix Lancets lancets, 1 each by Other route 4 (four) times daily. Test blood sugars 4 x times daily, Disp: 200 each, Rfl: 1   aspirin EC 81 MG tablet, Take 1 tablet (81 mg total) by mouth daily., Disp: 90 tablet, Rfl: 3   Bempedoic Acid-Ezetimibe (NEXLIZET) 180-10 MG TABS, Take 1 tablet by mouth daily., Disp: 90 tablet, Rfl: 3   Blood Glucose Calibration (ACCU-CHEK GUIDE CONTROL) LIQD, 1 Bottle by In Vitro route as needed., Disp: 1 each, Rfl: 0   Blood Glucose Monitoring Suppl (ACCU-CHEK GUIDE) w/Device KIT, 1 each by Does not apply route 4 (four) times daily. Use to test blood sugars 4x a day, Disp: 1 kit, Rfl: 0   buPROPion (WELLBUTRIN SR) 200 MG 12 hr tablet, Take one tablet by mouth once daily.,  Disp: 90 tablet, Rfl: 0   CALCIUM CITRATE-VITAMIN D PO, Take 1 tablet by mouth 3 (three) times daily. , Disp: , Rfl:    carvedilol (COREG) 6.25 MG tablet, Take 1 tablet (6.25 mg total) by mouth 2 (two) times daily., Disp: 180 tablet, Rfl: 3   cholecalciferol (VITAMIN D) 1000 units tablet, Take 1,000 Units by mouth daily., Disp: , Rfl:    Continuous Blood Gluc Sensor (FREESTYLE LIBRE 2 SENSOR) MISC, , Disp: , Rfl:    Cyanocobalamin (VITAMIN B-12 PO), Take 1 tablet by mouth every 14 (fourteen) days., Disp: , Rfl:    diphenhydrAMINE (BENADRYL) 25 MG tablet, Take 25 mg by mouth daily as needed for allergies or sleep., Disp: , Rfl:    glucose blood (ACCU-CHEK GUIDE) test strip, Use Accu Chek test strips to check blood sugar 4 times daily. DX:E11.21, Disp: 400 each, Rfl: 0   insulin aspart (NOVOLOG) 100 UNIT/ML injection, Inject into the skin daily as needed for high blood sugar. Per patient, sliding scale with meals as needed, Disp: ,  Rfl:    insulin glargine (LANTUS SOLOSTAR) 100 UNIT/ML Solostar Pen, Inject 8 Units into the skin daily., Disp: 7 mL, Rfl: 3   KERENDIA 20 MG TABS, Take 1 tablet by mouth daily., Disp: 30 tablet, Rfl: 6   lidocaine-prilocaine (EMLA) cream, Apply to affected area once, Disp: 30 g, Rfl: 3   loratadine (CLARITIN) 10 MG tablet, Take 10 mg by mouth as needed for allergies., Disp: , Rfl:    melatonin 5 MG TABS, Take 5 mg by mouth at bedtime., Disp: , Rfl:    omeprazole (PRILOSEC) 40 MG capsule, Take 1 capsule (40 mg total) by mouth daily., Disp: 30 capsule, Rfl: 1   Pediatric Multivitamins-Iron (FLINTSTONES PLUS IRON) chewable tablet, Chew 1 tablet by mouth 2 (two) times daily., Disp: , Rfl:    polyethylene glycol (MIRALAX / GLYCOLAX) 17 g packet, Take 17 g by mouth daily., Disp: , Rfl:    Probiotic Product ([employer] PROBIOTIC PO), Take 1 capsule by mouth daily. , Disp: , Rfl:    Propylene Glycol (SYSTANE BALANCE OP), Place 1-2 drops into both eyes 3 (three) times daily as needed  (for dry eyes). , Disp: , Rfl:    SURE COMFORT PEN NEEDLES 32G X 4 MM MISC, Use as instructed to inject insulin and Victoza daily., Disp: 200 each, Rfl: 0   tobramycin (TOBREX) 0.3 % ophthalmic ointment, Place 1 application into both eyes as needed (Takes day before eye injections)., Disp: , Rfl:    vortioxetine HBr (TRINTELLIX) 20 MG TABS tablet, Take 1 tablet (20 mg total) by mouth at bedtime., Disp: 90 tablet, Rfl: 0 No current facility-administered medications for this visit.  Facility-Administered Medications Ordered in Other Visits:    acetaminophen (TYLENOL) 325 MG tablet, , , ,    diphenhydrAMINE (BENADRYL) 50 MG/ML injection, , , ,    famotidine (PEPCID) 20-0.9 MG/50ML-% IVPB, , , ,    palonosetron (ALOXI) 0.25 MG/5ML injection, , , ,   PHYSICAL EXAM: ECOG FS:1 - Symptomatic but completely ambulatory    Vitals:   09/03/21 1127  BP: 132/62  Pulse: (!) 58  Resp: 16  Temp: 98.4 F (36.9 C)  TempSrc: Oral  SpO2: 98%  Weight: 176 lb 8 oz (80.1 kg)   Physical Exam Vitals and nursing note reviewed.  Constitutional:      Appearance: She is well-developed. She is not ill-appearing or toxic-appearing.  HENT:     Head: Normocephalic and atraumatic.     Nose: Nose normal.  Eyes:     General: No scleral icterus.       Right eye: No discharge.        Left eye: No discharge.     Conjunctiva/sclera: Conjunctivae normal.  Neck:     Vascular: No JVD.  Cardiovascular:     Rate and Rhythm: Normal rate and regular rhythm.     Pulses: Normal pulses.     Heart sounds: Normal heart sounds.  Pulmonary:     Effort: Pulmonary effort is normal.     Breath sounds: Normal breath sounds.  Abdominal:     General: Bowel sounds are normal. There is no distension.     Palpations: Abdomen is soft. There is no mass.     Tenderness: There is no abdominal tenderness. There is no guarding or rebound.     Hernia: No hernia is present.     Comments: Incision site is without signs of infection.   Nontender to palpation.  Musculoskeletal:  General: Normal range of motion.     Cervical back: Normal range of motion.  Skin:    General: Skin is warm and dry.  Neurological:     Mental Status: She is oriented to person, place, and time.     GCS: GCS eye subscore is 4. GCS verbal subscore is 5. GCS motor subscore is 6.     Comments: Fluent speech, no facial droop.  Psychiatric:        Behavior: Behavior normal.        LABORATORY DATA: I have reviewed the data as listed    Latest Ref Rng & Units 09/03/2021   11:07 AM 08/30/2021   11:30 AM 08/17/2021   10:40 AM  CBC  WBC 4.0 - 10.5 K/uL 6.1  5.0  4.8   Hemoglobin 12.0 - 15.0 g/dL 10.4  10.3  10.1   Hematocrit 36.0 - 46.0 % 30.2  31.3  29.1   Platelets 150 - 400 K/uL 153  164  174         Latest Ref Rng & Units 09/03/2021   11:07 AM 08/30/2021   11:30 AM 08/17/2021   10:40 AM  CMP  Glucose 70 - 99 mg/dL 170  90  199   BUN 8 - 23 mg/dL 53  45  47   Creatinine 0.44 - 1.00 mg/dL 1.65  1.89  2.13   Sodium 135 - 145 mmol/L 137  142  141   Potassium 3.5 - 5.1 mmol/L 4.8  4.2  4.5   Chloride 98 - 111 mmol/L 104  111  109   CO2 22 - 32 mmol/L _0 Calcium 8.9 - 10.3 mg/dL 9.0  8.9  9.0   Total Protein 6.5 - 8.1 g/dL 6.2  6.2  6.4   Total Bilirubin 0.3 - 1.2 mg/dL 0.6  0.7  0.4   Alkaline Phos 38 - 126 U/L 62  51  56   AST 15 - 41 U/L _1 ALT 0 - 44 U/L _2 RADIOGRAPHIC STUDIES (from last 24 hours if applicable) I have personally reviewed the radiological images as listed and agreed with the findings in the report. DG Chest 2 View  Result Date: 09/03/2021 CLINICAL DATA:  Pain with inspiration after liver biopsy, shallow breathing. Right-sided pain. EXAM: CHEST - 2 VIEW COMPARISON:  11/05/2016 and CT chest 08/20/2021. FINDINGS: Trachea is midline. Heart size normal. Thoracic aorta is calcified. Left-sided Port-A-Cath terminates in the SVC. Lungs are clear. No pleural fluid. No  pneumothorax. Surgical clips project over the lower right chest. Flowing anterior osteophytosis in the thoracic spine. IMPRESSION: No acute findings. Electronically Signed   By: Lorin Picket M.D.   On: 09/03/2021 13:39       ASSESSMENT & PLAN: Patient is a 71 y.o. female  with oncologic history of metastatic breast cancer, ER negative followed by Dr. Lindi Adie.  I have viewed most recent oncology note and lab work.   #) Pain Patient with abdominal pain and " lung pain" after recent liver biopsy.  Patient is well-appearing and in no acute distress.  Abdominal exam is benign.  Incision site appears to be well-healing. -CMP today shows normal liver function.  CBC without leukocytosis and hemoglobin stable at 10.4. -Chest x-ray obtained and shows no acute infectious processes, no pneumothorax.  I viewed imaging and agree with radiologist impression. -Given incentive spirometer to  encourage deep breathing as she is hesitant to right now because of pain.  Also discussed splinting for cough or sneeze to help with pain. -Prescription sent to pharmacy for [place].  This is for severe pain only. I have reviewed the PDMP during this encounter.  Patient knows not to take more than 4 g of Tylenol per day. - Strict ED precautions discussed should symptoms worsen.   #) Metastatic breast cancer, ER negative Next appointment with oncologist is 09/07/21   Visit Diagnosis: 1. Port catheter in place   2. Malignant neoplasm of upper-outer quadrant of right female breast, unspecified estrogen receptor status ([place])   3. Abdominal pain, unspecified abdominal location   4. Reduced chest expansion on inspiration      Orders Placed This Encounter  Procedures   DG Chest 2 View    Standing Status:   Future    Number of Occurrences:   1    Standing Expiration Date:   09/03/2022    Order Specific Question:   Reason for Exam (SYMPTOM  OR DIAGNOSIS REQUIRED)    Answer:   pain with inspiration after liver biops 8/10.  Shallow breathing    Order Specific Question:   Preferred imaging location?    Answer:   [facility]    All questions were answered. The patient knows to call the clinic with any problems, questions or concerns. No barriers to learning was detected.  I have spent a total of 30 minutes minutes of face-to-face and non-face-to-face time, preparing to see the patient, obtaining and/or reviewing separately obtained history, performing a medically appropriate examination, counseling and educating the patient, ordering tests, documenting clinical information in the electronic health record, and care coordination (communications with other health care professionals or caregivers).    Thank you for allowing me to participate in the care of this patient.    Barrie Folk, PA-C Department of Hematology/Oncology [facility] at [facility] Phone: [phone]  [phone]    09/03/2021 5:10 PM

## 2021-09-03 NOTE — Telephone Encounter (Signed)
-----   Message from Anastasia Pall, RN sent at 08/31/2021  3:40 PM EDT ----- Regarding: 1st time trodelvy, being seen by Dr. Lindi Adie Hello, mrs. Fiorentino received her 1st trodelvy infusion today. She tolerated it well. Please follow-up with her. Thank you.

## 2021-09-04 ENCOUNTER — Encounter: Payer: Self-pay | Admitting: *Deleted

## 2021-09-04 NOTE — Progress Notes (Signed)
Per MD request, RN spoke with [employer] with [facility] pathology and sent a secure e-mail with pt information requesting breast prognostic panel be added to recent liver biopsy.

## 2021-09-06 LAB — SURGICAL PATHOLOGY

## 2021-09-06 MED FILL — Dexamethasone Sodium Phosphate Inj 100 MG/10ML: INTRAMUSCULAR | Qty: 1 | Status: AC

## 2021-09-06 MED FILL — Fosaprepitant Dimeglumine For IV Infusion 150 MG (Base Eq): INTRAVENOUS | Qty: 5 | Status: AC

## 2021-09-06 NOTE — Progress Notes (Signed)
Patient Care Team: Isaac Bliss, Rayford Halsted, MD as PCP - General (Internal Medicine) Larey Dresser, MD as PCP - Advanced Heart Failure (Cardiology) Stark Klein, MD as Consulting Physician (General Surgery) Jacelyn Pi, MD as Consulting Physician (Endocrinology) [name], MD as Consulting Physician (Plastic Surgery) Gery Pray, MD as Consulting Physician (Radiation Oncology) Juanita Craver, MD as Consulting Physician (Gastroenterology) Alda Berthold, DO as Consulting Physician (Neurology) Starlyn Skeans, MD as Consulting Physician (Family Medicine) Sherlynn Stalls, MD as Consulting Physician (Ophthalmology) Salvadore Dom, MD as Consulting Physician (Obstetrics and Gynecology) Raina Mina, RPH-CPP (Pharmacist) Nicholas Lose, MD as Consulting Physician (Hematology and Oncology)  DIAGNOSIS:  Encounter Diagnosis  Name Primary?   Malignant neoplasm of upper-outer quadrant of right breast in female, estrogen receptor negative ([place])     SUMMARY OF ONCOLOGIC HISTORY: Oncology History  Breast cancer of upper-outer quadrant of right female breast ([place])  07/19/2014 Initial Biopsy   (R) breast needle biopsy (10:00): IDC, DCIS, grade 2. ER-, PR-, HER2+ (ratio 2.42). Ki67 90%.    07/22/2014 Initial Diagnosis   Breast cancer of upper-outer quadrant of right female breast   08/08/2014 - 11/21/2014 Neo-Adjuvant Chemotherapy   Taxotere/Carbo/Herceptin/Perjeta x 6 cycles.    12/12/2014 - 10/02/2015 Chemotherapy   Maintenance Herceptin (to complete 1 year of therapy).    12/27/2014 Surgery   (R) lumpectomy with SLNB [facility]): IDC, grade 2, spanning 4 cm, associated high grade DCIS. Negative margins. 1/3 SLN (+).   ER/PR repeated and remain negative.   ypT2, ypN1a: Stage IIB   01/06/2015 Surgery   Bilateral breast mammoplasty ([place]): Right breast path with intralymphatic emboli of ductal carcinoma. Left breast benign.    02/02/2015 Imaging   CT chest: No  findings of metastatic disease in the chest. Right axillary fluid density lesion likely postoperative seroma or hematoma. Small left subpectoral nodes warrant followup attention.   02/02/2015 Imaging   Bone scan: No scintigraphic evidence of osseous metastatic disease   02/16/2015 Surgery   (R) mastectomy and ALND [facility]): Scattered foci of high grade DCIS, scattered intralymphatic tumor emboli. Margins negative. 0/15 LNs.    04/05/2015 - 05/19/2015 Radiation Therapy   Adjuvant XRT ([place]). The right chest wall was treated to 50.4 Gy in 28 fractions at 1.8 Gy per fraction.  The right mastectomy scar was treated to 10 Gy in 5 fractions at 2 Gy per fraction.    12/06/2015 PET scan   Right subpectoral lymph node measuring 9 mm and exhibits malignant range FDG uptake, suspicious for metastatic adenopathy. Subcutaneous nodule within the upper left chest wall exhibits malignant range uptake, which is indeterminate; this is at the site of previous Port-A-Cath and is favored to represent postsurgical change. Indeterminate left adrenal nodules which exhibit mild uptake, favored to represent benign adenoma.   08/11/2017 - 11/03/2017 Chemotherapy   The patient had ado-trastuzumab emtansine (KADCYLA) 320 mg in sodium chloride 0.9 % 250 mL chemo infusion, 3.6 mg/kg = 320 mg, Intravenous, Once, 4 of 5 cycles Administration: 320 mg (08/12/2017), 320 mg (09/01/2017), 320 mg (09/23/2017)  for chemotherapy treatment.    11/18/2017 - 08/30/2020 Chemotherapy   The patient had trastuzumab (HERCEPTIN) 546 mg in sodium chloride 0.9 % 250 mL chemo infusion, 6 mg/kg = 546 mg (100 % of original dose 6 mg/kg), Intravenous,  Once, 2 of 2 cycles Dose modification: 6 mg/kg (original dose 6 mg/kg, Cycle 1, Reason: Provider Judgment) Administration: 546 mg (11/18/2017), 546 mg (12/16/2017) pertuzumab (PERJETA) 420 mg in sodium chloride  0.9 % 250 mL chemo infusion, 420 mg (100 % of original dose 420 mg), Intravenous, Once, 15 of 15  cycles Dose modification: 420 mg (original dose 420 mg, Cycle 18), 420 mg (original dose 420 mg, Cycle 27) Administration: 420 mg (10/06/2018), 420 mg (10/27/2018), 420 mg (11/18/2018), 420 mg (12/08/2018), 420 mg (12/29/2018), 420 mg (01/19/2019), 420 mg (02/16/2019), 420 mg (03/16/2019), 420 mg (04/13/2019), 420 mg (06/08/2019), 420 mg (07/06/2019), 420 mg (08/03/2019), 420 mg (08/31/2019), 420 mg (09/28/2019), 420 mg (05/19/2019) trastuzumab-anns (KANJINTI) 546 mg in sodium chloride 0.9 % 250 mL chemo infusion, 6 mg/kg = 546 mg (100 % of original dose 6 mg/kg), Intravenous,  Once, 30 of 30 cycles Dose modification: 6 mg/kg (original dose 6 mg/kg, Cycle 3, Reason: Other (see comments), Comment: requested step therapy by [facility] insurance), 4 mg/kg (original dose 6 mg/kg, Cycle 9, Reason: Provider Judgment), 6 mg/kg (original dose 6 mg/kg, Cycle 17, Reason: Provider Judgment), 4 mg/kg (original dose 6 mg/kg, Cycle 27, Reason: Provider Judgment), 6 mg/kg (original dose 6 mg/kg, Cycle 27, Reason: Provider Judgment) Administration: 546 mg (01/13/2018), 546 mg (02/10/2018), 546 mg (03/03/2018), 546 mg (03/24/2018), 546 mg (04/14/2018), 546 mg (05/05/2018), 357 mg (05/26/2018), 357 mg (06/09/2018), 357 mg (06/23/2018), 357 mg (07/07/2018), 357 mg (07/21/2018), 357 mg (08/04/2018), 357 mg (08/18/2018), 357 mg (09/01/2018), 546 mg (09/15/2018), 546 mg (10/06/2018), 546 mg (10/27/2018), 546 mg (11/18/2018), 546 mg (12/08/2018), 546 mg (12/29/2018), 546 mg (01/19/2019), 546 mg (02/16/2019), 480 mg (03/16/2019), 483 mg (04/13/2019), 450 mg (06/08/2019), 450 mg (07/06/2019), 450 mg (08/03/2019), 450 mg (08/31/2019), 450 mg (09/28/2019)  for chemotherapy treatment.    12/31/2017 - 09/01/2018 Chemotherapy   The patient had PACLitaxel-protein bound (ABRAXANE) chemo infusion 175 mg, 80 mg/m2 = 175 mg (100 % of original dose 80 mg/m2), Intravenous,  Once, 5 of 10 cycles Dose modification: 80 mg/m2 (original dose 80 mg/m2, Cycle 11) Administration: 175 mg  (07/07/2018), 175 mg (07/21/2018), 175 mg (08/04/2018), 175 mg (08/18/2018), 175 mg (09/01/2018) PACLitaxel-protein bound (ABRAXANE) chemo infusion 200 mg, 100 mg/m2 = 200 mg, Intravenous, Once, 10 of 10 cycles Dose modification: 80 mg/m2 (original dose 100 mg/m2, Cycle 2, Reason: Provider Judgment), 80 mg/m2 (original dose 100 mg/m2, Cycle 2, Reason: Provider Judgment) Administration: 200 mg (12/31/2017), 200 mg (01/06/2018), 175 mg (01/20/2018), 175 mg (01/27/2018), 175 mg (02/10/2018), 175 mg (03/03/2018), 175 mg (03/10/2018), 175 mg (03/24/2018), 175 mg (04/07/2018), 175 mg (04/14/2018), 175 mg (04/28/2018), 175 mg (05/05/2018), 175 mg (05/26/2018), 175 mg (06/09/2018), 175 mg (05/19/2018)  for chemotherapy treatment.    10/05/2020 - 01/18/2021 Chemotherapy   Patient is on Treatment Plan : BREAST METASTATIC fam-trastuzumab deruxtecan-nxki (Enhertu) q21d     02/19/2021 - 05/14/2021 Chemotherapy   Patient is on Treatment Plan : BREAST Trastuzumab q21d x 13 cycles     06/20/2021 - 08/01/2021 Chemotherapy   Patient is on Treatment Plan : BREAST METASTATIC fam-trastuzumab deruxtecan-nxki (Enhertu) q21d     08/31/2021 -  Chemotherapy   Patient is on Treatment Plan : BREAST METASTATIC Sacituzumab govitecan-hziy Ivette Loyal) q21d     Metastasis to liver ([place])  08/04/2017 Initial Diagnosis   Metastasis to liver ([place])   08/12/2017 - 10/14/2017 Chemotherapy   The patient had ado-trastuzumab emtansine (KADCYLA) 320 mg in sodium chloride 0.9 % 250 mL chemo infusion, 3.6 mg/kg = 320 mg, Intravenous, Once, 4 of 5 cycles Administration: 320 mg (08/12/2017), 320 mg (09/01/2017), 320 mg (09/23/2017)  for chemotherapy treatment.    11/18/2017 - 08/30/2020 Chemotherapy   The patient had trastuzumab (  HERCEPTIN) 546 mg in sodium chloride 0.9 % 250 mL chemo infusion, 6 mg/kg = 546 mg (100 % of original dose 6 mg/kg), Intravenous,  Once, 2 of 2 cycles Dose modification: 6 mg/kg (original dose 6 mg/kg, Cycle 1, Reason: Provider  Judgment) Administration: 546 mg (11/18/2017), 546 mg (12/16/2017) pertuzumab (PERJETA) 420 mg in sodium chloride 0.9 % 250 mL chemo infusion, 420 mg (100 % of original dose 420 mg), Intravenous, Once, 15 of 15 cycles Dose modification: 420 mg (original dose 420 mg, Cycle 18), 420 mg (original dose 420 mg, Cycle 27) Administration: 420 mg (10/06/2018), 420 mg (10/27/2018), 420 mg (11/18/2018), 420 mg (12/08/2018), 420 mg (12/29/2018), 420 mg (01/19/2019), 420 mg (02/16/2019), 420 mg (03/16/2019), 420 mg (04/13/2019), 420 mg (06/08/2019), 420 mg (07/06/2019), 420 mg (08/03/2019), 420 mg (08/31/2019), 420 mg (09/28/2019), 420 mg (05/19/2019) trastuzumab-anns (KANJINTI) 546 mg in sodium chloride 0.9 % 250 mL chemo infusion, 6 mg/kg = 546 mg (100 % of original dose 6 mg/kg), Intravenous,  Once, 30 of 30 cycles Dose modification: 6 mg/kg (original dose 6 mg/kg, Cycle 3, Reason: Other (see comments), Comment: requested step therapy by [facility] insurance), 4 mg/kg (original dose 6 mg/kg, Cycle 9, Reason: Provider Judgment), 6 mg/kg (original dose 6 mg/kg, Cycle 17, Reason: Provider Judgment), 4 mg/kg (original dose 6 mg/kg, Cycle 27, Reason: Provider Judgment), 6 mg/kg (original dose 6 mg/kg, Cycle 27, Reason: Provider Judgment) Administration: 546 mg (01/13/2018), 546 mg (02/10/2018), 546 mg (03/03/2018), 546 mg (03/24/2018), 546 mg (04/14/2018), 546 mg (05/05/2018), 357 mg (05/26/2018), 357 mg (06/09/2018), 357 mg (06/23/2018), 357 mg (07/07/2018), 357 mg (07/21/2018), 357 mg (08/04/2018), 357 mg (08/18/2018), 357 mg (09/01/2018), 546 mg (09/15/2018), 546 mg (10/06/2018), 546 mg (10/27/2018), 546 mg (11/18/2018), 546 mg (12/08/2018), 546 mg (12/29/2018), 546 mg (01/19/2019), 546 mg (02/16/2019), 480 mg (03/16/2019), 483 mg (04/13/2019), 450 mg (06/08/2019), 450 mg (07/06/2019), 450 mg (08/03/2019), 450 mg (08/31/2019), 450 mg (09/28/2019)  for chemotherapy treatment.      CHIEF COMPLIANT: Cycle 1 day 8 Trodelvy and Toxicity check  INTERVAL HISTORY:  Darlene [name] is a 71 y.o. with above-mentioned history of HER-2 positive, estrogen receptor negative breast cancer.  She presents to the clinic today for follow-up and treatment. She states that she use the compazine so she had some mild nausea. She also had some fatigue feeling tired. Denies hair lost. She had some very light spotting in the last few days. It was dark red not heavy and not everyday. She do complain of pain on right side and shoulders but it has gotten better.   ALLERGIES:  is allergic to nsaids and tape.  MEDICATIONS:  Current Outpatient Medications  Medication Sig Dispense Refill   Accu-Chek Softclix Lancets lancets 1 each by Other route 4 (four) times daily. Test blood sugars 4 x times daily 200 each 1   aspirin EC 81 MG tablet Take 1 tablet (81 mg total) by mouth daily. 90 tablet 3   Bempedoic Acid-Ezetimibe (NEXLIZET) 180-10 MG TABS Take 1 tablet by mouth daily. 90 tablet 3   Blood Glucose Calibration (ACCU-CHEK GUIDE CONTROL) LIQD 1 Bottle by In Vitro route as needed. 1 each 0   Blood Glucose Monitoring Suppl (ACCU-CHEK GUIDE) w/Device KIT 1 each by Does not apply route 4 (four) times daily. Use to test blood sugars 4x a day 1 kit 0   buPROPion (WELLBUTRIN SR) 200 MG 12 hr tablet Take one tablet by mouth once daily. 90 tablet 0   CALCIUM CITRATE-VITAMIN D PO Take  1 tablet by mouth 3 (three) times daily.      carvedilol (COREG) 6.25 MG tablet Take 1 tablet (6.25 mg total) by mouth 2 (two) times daily. 180 tablet 3   cholecalciferol (VITAMIN D) 1000 units tablet Take 1,000 Units by mouth daily.     Continuous Blood Gluc Sensor (FREESTYLE LIBRE 2 SENSOR) MISC      Cyanocobalamin (VITAMIN B-12 PO) Take 1 tablet by mouth every 14 (fourteen) days.     diphenhydrAMINE (BENADRYL) 25 MG tablet Take 25 mg by mouth daily as needed for allergies or sleep.     glucose blood (ACCU-CHEK GUIDE) test strip Use Accu Chek test strips to check blood sugar 4 times daily. DX:E11.21 400  each 0   HYDROcodone-acetaminophen (NORCO) 5-325 MG tablet Take 1 tablet by mouth every 6 (six) hours as needed for severe pain. 10 tablet 0   insulin aspart (NOVOLOG) 100 UNIT/ML injection Inject into the skin daily as needed for high blood sugar. Per patient, sliding scale with meals as needed     insulin glargine (LANTUS SOLOSTAR) 100 UNIT/ML Solostar Pen Inject 8 Units into the skin daily. 7 mL 3   KERENDIA 20 MG TABS Take 1 tablet by mouth daily. 30 tablet 6   loratadine (CLARITIN) 10 MG tablet Take 10 mg by mouth as needed for allergies.     melatonin 5 MG TABS Take 5 mg by mouth at bedtime.     omeprazole (PRILOSEC) 40 MG capsule Take 1 capsule (40 mg total) by mouth daily. 30 capsule 1   Pediatric Multivitamins-Iron (FLINTSTONES PLUS IRON) chewable tablet Chew 1 tablet by mouth 2 (two) times daily.     polyethylene glycol (MIRALAX / GLYCOLAX) 17 g packet Take 17 g by mouth daily.     Probiotic Product ([employer] PROBIOTIC PO) Take 1 capsule by mouth daily.      Propylene Glycol (SYSTANE BALANCE OP) Place 1-2 drops into both eyes 3 (three) times daily as needed (for dry eyes).      SURE COMFORT PEN NEEDLES 32G X 4 MM MISC Use as instructed to inject insulin and Victoza daily. 200 each 0   tobramycin (TOBREX) 0.3 % ophthalmic ointment Place 1 application into both eyes as needed (Takes day before eye injections).     vortioxetine HBr (TRINTELLIX) 20 MG TABS tablet Take 1 tablet (20 mg total) by mouth at bedtime. 90 tablet 0   No current facility-administered medications for this visit.   Facility-Administered Medications Ordered in Other Visits  Medication Dose Route Frequency Provider Last Rate Last Admin   acetaminophen (TYLENOL) 325 MG tablet            diphenhydrAMINE (BENADRYL) 50 MG/ML injection            famotidine (PEPCID) 20-0.9 MG/50ML-% IVPB            palonosetron (ALOXI) 0.25 MG/5ML injection             PHYSICAL EXAMINATION: ECOG PERFORMANCE STATUS: 1 - Symptomatic but  completely ambulatory  Vitals:   09/07/21 1058  BP: 133/75  Pulse: (!) 54  Temp: 97.7 F (36.5 C)  SpO2: 100%   Filed Weights   09/07/21 1058  Weight: 174 lb 1.6 oz (79 kg)      LABORATORY DATA:  I have reviewed the data as listed    Latest Ref Rng & Units 09/07/2021   10:46 AM 09/03/2021   11:07 AM 08/30/2021   11:30 AM  CMP  Glucose 70 -  99 mg/dL 175  170  90   BUN 8 - 23 mg/dL 46  53  45   Creatinine 0.44 - 1.00 mg/dL 1.80  1.65  1.89   Sodium 135 - 145 mmol/L 141  137  142   Potassium 3.5 - 5.1 mmol/L 4.5  4.8  4.2   Chloride 98 - 111 mmol/L 107  104  111   CO2 22 - 32 mmol/L $RemoveB'30  29  25   'ihfTVZuK$ Calcium 8.9 - 10.3 mg/dL 9.3  9.0  8.9   Total Protein 6.5 - 8.1 g/dL 6.0  6.2  6.2   Total Bilirubin 0.3 - 1.2 mg/dL 0.3  0.6  0.7   Alkaline Phos 38 - 126 U/L 68  62  51   AST 15 - 41 U/L $Remo'28  30  30   'YSTAs$ ALT 0 - 44 U/L $Remo'14  14  16     'BsEgZ$ Lab Results  Component Value Date   WBC 3.6 (L) 09/07/2021   HGB 9.6 (L) 09/07/2021   HCT 27.2 (L) 09/07/2021   MCV 97.5 09/07/2021   PLT 180 09/07/2021   NEUTROABS 2.4 09/07/2021    ASSESSMENT & PLAN:  Breast cancer of upper-outer quadrant of right female breast ([place]) 07/19/2014: Right breast cancer grade 2 IDC ER/PR negative HER2 positive Ki-67 90% treated with neoadjuvant chemo with TCHP x6 cycles followed by Herceptin maintenance, right lumpectomy had residual disease 4 cm 1/3 lymph node positive, ER/PR negative, adjuvant radiation and capecitabine completed 09/27/2015 12/06/2015: Recurrence: Right subpectoral lymph node and subcutaneous nodule, liver mass (biopsy adenocarcinoma consistent with upper GI or pancreaticobiliary primary, HER2 negative, ER negative) July 2019: Metastatic disease: TDM 1 followed by Herceptin maintenance, pertuzumab added 10/06/2018, Abraxane 12/31/2017-09/01/2018, Herceptin and pertuzumab maintenance   Prior treatment: Enhertu started 10/05/2020 switched to Herceptin maintenance 02/19/2021 switched to Enhertu at higher  dosage x 3 cycles discontinued 08/01/2021    02/04/2021: PET CT scan: Complete metabolic response, no evidence of hypermetabolic disease in the liver (mass measured 2.7 cm previously is now 1.5 cm with no uptake)    08/20/2021: CT CAP: Increased size of the metastatic liver lesions went from 6 cm to 7.5 cm and hepatic dome lesion is now 17 mm previously 10 mm, stable size of the right subpectoral lymph node, no new sites of metastatic disease, stable upper abdominal lymph nodes probably benign  ------------------------------------------------------------------------------------------------------------------------------------------- Current treatment: Ivette Loyal cycle 1 day 8 Liver biopsy was performed 08/30/2021: Poorly differentiated carcinoma consistent with metastatic breast cancer.  ER negative, PR negative, GCDFP negative Molecular testing: Pending  Toxicities: 1.  Mild nausea 2. mild fatigue 3.  Chemo-induced anemia: Monitoring 4.  Mild leukopenia  Awaiting the results of molecular testing from the liver biopsy.     No orders of the defined types were placed in this encounter.  The patient has a good understanding of the overall plan. she agrees with it. she will call with any problems that may develop before the next visit here. Total time spent: 30 mins including face to face time and time spent for planning, charting and co-ordination of care   Harriette Ohara, MD 09/07/21    I Gardiner Coins am scribing for Dr. Lindi Adie  I have reviewed the above documentation for accuracy and completeness, and I agree with the above.

## 2021-09-07 ENCOUNTER — Inpatient Hospital Stay: Payer: Medicare PPO

## 2021-09-07 ENCOUNTER — Inpatient Hospital Stay (HOSPITAL_BASED_OUTPATIENT_CLINIC_OR_DEPARTMENT_OTHER): Payer: Medicare PPO | Admitting: Hematology and Oncology

## 2021-09-07 ENCOUNTER — Other Ambulatory Visit: Payer: Self-pay

## 2021-09-07 VITALS — BP 146/57 | HR 67 | Temp 97.7°F | Resp 18

## 2021-09-07 DIAGNOSIS — C787 Secondary malignant neoplasm of liver and intrahepatic bile duct: Secondary | ICD-10-CM | POA: Diagnosis not present

## 2021-09-07 DIAGNOSIS — Z171 Estrogen receptor negative status [ER-]: Secondary | ICD-10-CM | POA: Diagnosis not present

## 2021-09-07 DIAGNOSIS — C50411 Malignant neoplasm of upper-outer quadrant of right female breast: Secondary | ICD-10-CM | POA: Diagnosis not present

## 2021-09-07 DIAGNOSIS — Z95828 Presence of other vascular implants and grafts: Secondary | ICD-10-CM

## 2021-09-07 DIAGNOSIS — Z5112 Encounter for antineoplastic immunotherapy: Secondary | ICD-10-CM | POA: Diagnosis not present

## 2021-09-07 DIAGNOSIS — C50811 Malignant neoplasm of overlapping sites of right female breast: Secondary | ICD-10-CM | POA: Diagnosis not present

## 2021-09-07 DIAGNOSIS — Z452 Encounter for adjustment and management of vascular access device: Secondary | ICD-10-CM | POA: Diagnosis not present

## 2021-09-07 DIAGNOSIS — D6481 Anemia due to antineoplastic chemotherapy: Secondary | ICD-10-CM | POA: Diagnosis not present

## 2021-09-07 DIAGNOSIS — R5383 Other fatigue: Secondary | ICD-10-CM | POA: Diagnosis not present

## 2021-09-07 LAB — CBC WITH DIFFERENTIAL/PLATELET
Abs Immature Granulocytes: 0.04 10*3/uL (ref 0.00–0.07)
Basophils Absolute: 0 10*3/uL (ref 0.0–0.1)
Basophils Relative: 0 %
Eosinophils Absolute: 0.1 10*3/uL (ref 0.0–0.5)
Eosinophils Relative: 2 %
HCT: 27.2 % — ABNORMAL LOW (ref 36.0–46.0)
Hemoglobin: 9.6 g/dL — ABNORMAL LOW (ref 12.0–15.0)
Immature Granulocytes: 1 %
Lymphocytes Relative: 20 %
Lymphs Abs: 0.7 10*3/uL (ref 0.7–4.0)
MCH: 34.4 pg — ABNORMAL HIGH (ref 26.0–34.0)
MCHC: 35.3 g/dL (ref 30.0–36.0)
MCV: 97.5 fL (ref 80.0–100.0)
Monocytes Absolute: 0.4 10*3/uL (ref 0.1–1.0)
Monocytes Relative: 10 %
Neutro Abs: 2.4 10*3/uL (ref 1.7–7.7)
Neutrophils Relative %: 67 %
Platelets: 180 10*3/uL (ref 150–400)
RBC: 2.79 MIL/uL — ABNORMAL LOW (ref 3.87–5.11)
RDW: 13.8 % (ref 11.5–15.5)
WBC: 3.6 10*3/uL — ABNORMAL LOW (ref 4.0–10.5)
nRBC: 0 % (ref 0.0–0.2)

## 2021-09-07 LAB — COMPREHENSIVE METABOLIC PANEL
ALT: 14 U/L (ref 0–44)
AST: 28 U/L (ref 15–41)
Albumin: 3.8 g/dL (ref 3.5–5.0)
Alkaline Phosphatase: 68 U/L (ref 38–126)
Anion gap: 4 — ABNORMAL LOW (ref 5–15)
BUN: 46 mg/dL — ABNORMAL HIGH (ref 8–23)
CO2: 30 mmol/L (ref 22–32)
Calcium: 9.3 mg/dL (ref 8.9–10.3)
Chloride: 107 mmol/L (ref 98–111)
Creatinine, Ser: 1.8 mg/dL — ABNORMAL HIGH (ref 0.44–1.00)
GFR, Estimated: 30 mL/min — ABNORMAL LOW (ref >60)
Glucose, Bld: 175 mg/dL — ABNORMAL HIGH (ref 70–99)
Potassium: 4.5 mmol/L (ref 3.5–5.1)
Sodium: 141 mmol/L (ref 135–145)
Total Bilirubin: 0.3 mg/dL (ref 0.3–1.2)
Total Protein: 6 g/dL — ABNORMAL LOW (ref 6.5–8.1)

## 2021-09-07 LAB — PHOSPHORUS: Phosphorus: 3.3 mg/dL (ref 2.5–4.6)

## 2021-09-07 LAB — MAGNESIUM: Magnesium: 2.1 mg/dL (ref 1.7–2.4)

## 2021-09-07 MED ORDER — ATROPINE SULFATE 1 MG/ML IV SOLN: 0.5000 mg | Freq: Once | INTRAVENOUS | Status: DC | PRN

## 2021-09-07 MED ORDER — SODIUM CHLORIDE 0.9 % IV SOLN
150.0000 mg | Freq: Once | INTRAVENOUS | Status: AC
  Administered 2021-09-07: 150 mg via INTRAVENOUS
  Filled 2021-09-07: qty 150

## 2021-09-07 MED ORDER — SODIUM CHLORIDE 0.9% FLUSH
10.0000 mL | Freq: Once | INTRAVENOUS | Status: AC
  Administered 2021-09-07: 10 mL

## 2021-09-07 MED ORDER — ACETAMINOPHEN 325 MG PO TABS
650.0000 mg | ORAL_TABLET | Freq: Once | ORAL | Status: AC
  Administered 2021-09-07: 650 mg via ORAL
  Filled 2021-09-07: qty 2

## 2021-09-07 MED ORDER — PALONOSETRON HCL INJECTION 0.25 MG/5ML
0.2500 mg | Freq: Once | INTRAVENOUS | Status: AC
  Administered 2021-09-07: 0.25 mg via INTRAVENOUS
  Filled 2021-09-07: qty 5

## 2021-09-07 MED ORDER — HEPARIN SOD (PORK) LOCK FLUSH 100 UNIT/ML IV SOLN
500.0000 [IU] | Freq: Once | INTRAVENOUS | Status: AC | PRN
  Administered 2021-09-07: 500 [IU]

## 2021-09-07 MED ORDER — SODIUM CHLORIDE 0.9 % IV SOLN
10.0000 mg | Freq: Once | INTRAVENOUS | Status: AC
  Administered 2021-09-07: 10 mg via INTRAVENOUS
  Filled 2021-09-07: qty 10

## 2021-09-07 MED ORDER — FAMOTIDINE IN NACL 20-0.9 MG/50ML-% IV SOLN
20.0000 mg | Freq: Once | INTRAVENOUS | Status: AC
  Administered 2021-09-07: 20 mg via INTRAVENOUS
  Filled 2021-09-07: qty 50

## 2021-09-07 MED ORDER — SODIUM CHLORIDE 0.9 % IV SOLN: Freq: Once | INTRAVENOUS | Status: AC

## 2021-09-07 MED ORDER — SODIUM CHLORIDE 0.9 % IV SOLN
540.0000 mg | Freq: Once | INTRAVENOUS | Status: AC
  Administered 2021-09-07: 540 mg via INTRAVENOUS
  Filled 2021-09-07: qty 54

## 2021-09-07 MED ORDER — DIPHENHYDRAMINE HCL 50 MG/ML IJ SOLN
50.0000 mg | Freq: Once | INTRAMUSCULAR | Status: AC
  Administered 2021-09-07: 50 mg via INTRAVENOUS
  Filled 2021-09-07: qty 1

## 2021-09-07 MED ORDER — SODIUM CHLORIDE 0.9% FLUSH
10.0000 mL | INTRAVENOUS | Status: DC | PRN
  Administered 2021-09-07: 10 mL

## 2021-09-07 NOTE — Assessment & Plan Note (Addendum)
07/19/2014: Right breast cancer grade 2 IDC ER/PR negative HER2 positive Ki-67 90% treated with neoadjuvant chemo with TCHP x6 cycles followed by Herceptin maintenance, right lumpectomy had residual disease 4 cm 1/3 lymph node positive, ER/PR negative, adjuvant radiation and capecitabine completed 09/27/2015 12/06/2015: Recurrence: Right subpectoral lymph node and subcutaneous nodule, liver mass (biopsy adenocarcinoma consistent with upper GI or pancreaticobiliary primary, HER2 negative, ER negative) July 2019: Metastatic disease: TDM 1 followed by Herceptin maintenance,pertuzumabadded 10/06/2018, Abraxane 12/31/2017-09/01/2018, Herceptin and pertuzumab maintenance  Priortreatment: Enhertu started 9/15/2022switched to Herceptin maintenance 02/19/2021 switched to Enhertu at higher dosage x 3 cycles discontinued 08/01/2021   02/04/2021: PET CT scan: Complete metabolic response, no evidence of hypermetabolic disease in the liver (mass measured 2.7 cm previously is now 1.5 cm with no uptake)  08/20/2021: CT CAP: Increased size of the metastatic liver lesions went from 6 cm to 7.5 cm and hepatic dome lesion is now 17 mm previously 10 mm, stable size of the right subpectoral lymph node, no new sites of metastatic disease, stable upper abdominal lymph nodes probably benign  ------------------------------------------------------------------------------------------------------------------------------------------- Current treatment: Darlene [name] cycle 1 day 8 Liver biopsy was performed 08/30/2021: Poorly differentiated carcinoma consistent with metastatic breast cancer.  ER negative, PR negative, GCDFP negative Molecular testing: Pending  Toxicities: 1.  Mild nausea 2. mild fatigue 3.  Chemo-induced anemia: Monitoring 4.  Mild leukopenia  Awaiting the results of molecular testing from the liver biopsy.

## 2021-09-07 NOTE — Patient Instructions (Signed)
[facility]  Discharge Instructions: Thank you for choosing [facility] to provide your oncology and hematology care.   If you have a lab appointment with the Cancer Center, please go directly to the Cancer Center and check in at the registration area.   Wear comfortable clothing and clothing appropriate for easy access to any Portacath or PICC line.   We strive to give you quality time with your provider. You may need to reschedule your appointment if you arrive late (15 or more minutes).  Arriving late affects you and other patients whose appointments are after yours.  Also, if you miss three or more appointments without notifying the office, you may be dismissed from the clinic at the provider's discretion.      For prescription refill requests, have your pharmacy contact our office and allow 72 hours for refills to be completed.    Today you received the following chemotherapy and/or immunotherapy agents: trodelvy      To help prevent nausea and vomiting after your treatment, we encourage you to take your nausea medication as directed.  BELOW ARE SYMPTOMS THAT SHOULD BE REPORTED IMMEDIATELY: *FEVER GREATER THAN 100.4 F (38 C) OR HIGHER *CHILLS OR SWEATING *NAUSEA AND VOMITING THAT IS NOT CONTROLLED WITH YOUR NAUSEA MEDICATION *UNUSUAL SHORTNESS OF BREATH *UNUSUAL BRUISING OR BLEEDING *URINARY PROBLEMS (pain or burning when urinating, or frequent urination) *BOWEL PROBLEMS (unusual diarrhea, constipation, pain near the anus) TENDERNESS IN MOUTH AND THROAT WITH OR WITHOUT PRESENCE OF ULCERS (sore throat, sores in mouth, or a toothache) UNUSUAL RASH, SWELLING OR PAIN  UNUSUAL VAGINAL DISCHARGE OR ITCHING   Items with * indicate a potential emergency and should be followed up as soon as possible or go to the Emergency Department if any problems should occur.  Please show the CHEMOTHERAPY ALERT CARD or IMMUNOTHERAPY ALERT CARD at check-in to  the Emergency Department and triage nurse.  Should you have questions after your visit or need to cancel or reschedule your appointment, please contact [facility]  Dept: [phone]  and follow the prompts.  Office hours are 8:00 a.m. to 4:30 p.m. Monday - Friday. Please note that voicemails left after 4:00 p.m. may not be returned until the following business day.  We are closed weekends and major holidays. You have access to a nurse at all times for urgent questions. Please call the main number to the clinic Dept: [phone] and follow the prompts.   For any non-urgent questions, you may also contact your provider using MyChart. We now offer e-Visits for anyone 18 and older to request care online for non-urgent symptoms. For details visit [email or web address].   Also download the MyChart app! Go to the app store, search "MyChart", open the app, select [facility], and log in with your MyChart username and password.  Masks are optional in the cancer centers. If you would like for your care team to wear a mask while they are taking care of you, please let them know. You may have one support person who is at least 69 years old accompany you for your appointments. 

## 2021-09-17 ENCOUNTER — Encounter: Payer: Self-pay | Admitting: Physical Therapy

## 2021-09-17 ENCOUNTER — Ambulatory Visit: Payer: Medicare PPO | Admitting: Physical Therapy

## 2021-09-17 ENCOUNTER — Other Ambulatory Visit (INDEPENDENT_AMBULATORY_CARE_PROVIDER_SITE_OTHER): Payer: Self-pay | Admitting: Family Medicine

## 2021-09-17 DIAGNOSIS — M6259 Muscle wasting and atrophy, not elsewhere classified, multiple sites: Secondary | ICD-10-CM | POA: Diagnosis not present

## 2021-09-17 DIAGNOSIS — M25671 Stiffness of right ankle, not elsewhere classified: Secondary | ICD-10-CM | POA: Diagnosis not present

## 2021-09-17 DIAGNOSIS — Z9181 History of falling: Secondary | ICD-10-CM | POA: Diagnosis not present

## 2021-09-17 DIAGNOSIS — M6281 Muscle weakness (generalized): Secondary | ICD-10-CM

## 2021-09-17 DIAGNOSIS — I10 Essential (primary) hypertension: Secondary | ICD-10-CM | POA: Diagnosis not present

## 2021-09-17 DIAGNOSIS — E118 Type 2 diabetes mellitus with unspecified complications: Secondary | ICD-10-CM | POA: Diagnosis not present

## 2021-09-17 DIAGNOSIS — R278 Other lack of coordination: Secondary | ICD-10-CM | POA: Diagnosis not present

## 2021-09-17 DIAGNOSIS — K5900 Constipation, unspecified: Secondary | ICD-10-CM | POA: Diagnosis not present

## 2021-09-17 DIAGNOSIS — M25572 Pain in left ankle and joints of left foot: Secondary | ICD-10-CM | POA: Diagnosis not present

## 2021-09-17 DIAGNOSIS — R269 Unspecified abnormalities of gait and mobility: Secondary | ICD-10-CM | POA: Diagnosis not present

## 2021-09-17 DIAGNOSIS — F339 Major depressive disorder, recurrent, unspecified: Secondary | ICD-10-CM | POA: Diagnosis not present

## 2021-09-17 DIAGNOSIS — Z794 Long term (current) use of insulin: Secondary | ICD-10-CM

## 2021-09-17 NOTE — Therapy (Signed)
OUTPATIENT PHYSICAL THERAPY TREATMENT NOTE   Patient Name: Darlene [name] MRN: [id number] DOB:17-Apr-1950, 71 y.o., female Today's Date: 09/17/2021  PCP: Isaac Bliss, Rayford Halsted, MD REFERRING PROVIDER: Thornton Park, MD  END OF SESSION:   PT End of Session - 09/17/21 0850     Visit Number 3    Date for PT Re-Evaluation 11/16/21    Authorization Type [employer]    Authorization Time Period 08/24/2021-10/06/20223-    Authorization - Visit Number 3    Authorization - Number of Visits 12    PT Start Time 0845    PT Stop Time 0915    PT Time Calculation (min) 30 min    Activity Tolerance Patient tolerated treatment well    Behavior During Therapy [facility] for tasks assessed/performed             Past Medical History:  Diagnosis Date   Anemia    hx of - during 1st round chemo   Anxiety    Arthritis    in fingers, shoulders   Back pain    Balance problem    Breast cancer ([place])    Breast cancer of upper-outer quadrant of right female breast ([place]) 07/22/2014   Bunion, right foot    CHF (congestive heart failure) ([place])    Chronic kidney disease    creatininei levels high per pt    Clumsiness    Constipation    Depression    Diabetes mellitus without complication ([place])    86+ years type 2    Diabetic retinopathy ([place])    torn retina   Eating disorder    binge eating   Epistaxis 08/26/2014   Fatty liver    Foot drop, left foot    HCAP (healthcare-associated pneumonia) 12/18/2014   Heart murmur    never had any problems   History of radiation therapy 04/05/15-05/19/15   right chest wall was treated to 50.4 Gy in 28 fractions, right mastectomy scar was treated to 10 Gy in 5 fractions   Hyperlipidemia    Hypertension    Joint pain    Multiple food allergies    Neuromuscular disorder ([place])    diabetic neuropathy   Obesity    Obstructive sleep apnea on CPAP    does not use cpap all the time has lost 160 lbs    Ovarian cyst rupture    possible   Pneumonia     Pneumonia 11/2014   Radiation 04/05/15-05/19/15   right chest wall 50.4 Gy, mastectomy scar 10 Gy   Sleep apnea    Thyroid nodule 06/2014   Past Surgical History:  Procedure Laterality Date   ANAL RECTAL MANOMETRY N/A 08/01/2021   Procedure: ANO RECTAL MANOMETRY;  Surgeon: Thornton Park, MD;  Location: [facility];  Service: Gastroenterology;  Laterality: N/A;   APPENDECTOMY     BIOPSY THYROID Bilateral 06/2014   2 nodules - Benign    BREAST LUMPECTOMY WITH RADIOACTIVE SEED AND SENTINEL LYMPH NODE BIOPSY Right 12/27/2014   Procedure: BREAST LUMPECTOMY WITH RADIOACTIVE SEED AND SENTINEL LYMPH NODE BIOPSY;  Surgeon: Stark Klein, MD;  Location: [facility];  Service: General;  Laterality: Right;   BREAST REDUCTION SURGERY Bilateral 01/06/2015   Procedure: BILATERAL BREAST REDUCTION, RIGHT ONCOPLASTIC RECONSTRUCTION),LEFT BREAST REDUCTION FOR SYMMETRY;  Surgeon: [name], MD;  Location: [facility];  Service: Plastics;  Laterality: Bilateral;   BREATH TEK H PYLORI N/A 09/15/2012   Procedure: BREATH TEK H PYLORI;  Surgeon: Pedro Earls, MD;  Location: [facility];  Service: General;  Laterality: N/A;   BUNIONECTOMY Left 12/2013   CARPAL TUNNEL RELEASE Right    CARPAL TUNNEL RELEASE Left    CATARACT EXTRACTION     6/19   COLONOSCOPY     DUPUYTREN CONTRACTURE RELEASE     GASTRIC ROUX-EN-Y N/A 11/02/2012   Procedure: LAPAROSCOPIC ROUX-EN-Y GASTRIC;  Surgeon: Pedro Earls, MD;  Location: [facility];  Service: General;  Laterality: N/A;   IR GENERIC HISTORICAL  03/18/2016   IR US GUIDE VASC ACCESS LEFT 03/18/2016 Markus Daft, MD [facility]   IR GENERIC HISTORICAL  03/18/2016   IR FLUORO GUIDE CV LINE LEFT 03/18/2016 Markus Daft, MD [facility]   LAPAROSCOPIC APPENDECTOMY N/A 07/22/2015   Procedure: APPENDECTOMY LAPAROSCOPIC;  Surgeon: Michael Boston, MD;  Location: [facility];  Service: General;  Laterality: N/A;   MASTECTOMY Right 02/15/2015   modified   MODIFIED MASTECTOMY Right  02/15/2015   Procedure: RIGHT MODIFIED RADICAL MASTECTOMY;  Surgeon: Stark Klein, MD;  Location: [facility];  Service: General;  Laterality: Right;   ORIF WRIST FRACTURE Left 05/11/2019   Procedure: OPEN REDUCTION INTERNAL FIXATION (ORIF) left distal radius fracture;  Surgeon: Renette Butters, MD;  [facility];  Service: Orthopedics;  Laterality: Left;  with block   PORT-A-CATH REMOVAL Left 10/10/2015   Procedure: PORT REMOVAL;  Surgeon: Stark Klein, MD;  Location: [facility];  Service: General;  Laterality: Left;   PORTACATH PLACEMENT Left 08/02/2014   Procedure: INSERTION PORT-A-CATH;  Surgeon: Stark Klein, MD;  Location: [place];  Service: General;  Laterality: Left;   PORTACATH PLACEMENT N/A 11/05/2016   Procedure: INSERTION PORT-A-CATH;  Surgeon: Stark Klein, MD;  Location: [place];  Service: General;  Laterality: N/A;   RETINAL LASER PROCEDURE     retina tear on left eye   ROTATOR CUFF REPAIR Right 2005   SCAR REVISION Right 12/08/2015   Procedure: COMPLEX REPAIR RIGHT CHEST 10-15CM;  Surgeon: [name], MD;  Location: [place];  Service: Plastics;  Laterality: Right;   TOE SURGERY  1970s   bone spur   TRIGGER FINGER RELEASE     x3   Patient Active Problem List   Diagnosis Date Noted   Constipation due to outlet dysfunction    Stress 07/31/2021   Abnormal findings on diagnostic imaging of other parts of digestive tract 02/09/2021   Allergic rhinitis 02/09/2021   Arthritis of right knee 02/09/2021   Diverticular disease of colon 02/09/2021   Gallstones 02/09/2021   Hyperglycemia due to type 2 diabetes mellitus ([place]) 02/09/2021   Neuropathy 02/09/2021   Pure hypercholesterolemia 02/09/2021   Shoulder joint pain 02/09/2021   Thyroid nodule 02/09/2021   Vitamin B12 deficiency 02/09/2021   CKD (chronic kidney disease) stage 4, GFR 15-29 ml/min (HCC) 09/27/2020   Hammer toe of second toe of right foot 11/16/2018   Hallux valgus of right foot  11/16/2018   Essential hypertension 11/04/2018   Pain due to onychomycosis of toenails of both feet 10/14/2018   Pre-ulcerative calluses 10/14/2018   Diabetic retinal edema ([place]) 07/19/2018   Diabetic retinopathy of both eyes associated with type 2 diabetes mellitus ([place]) 06/28/2018   Goals of care, counseling/discussion 12/30/2017   Occult malignancy ([place]) 12/01/2017   Neoplastic malignant related fatigue 09/29/2017   Metastasis to liver ([place]) 08/04/2017   Lesion of liver 07/30/2017   Type 2 diabetes mellitus without complication, with long-term current use of insulin ([place]) 03/11/2017   Vitamin D deficiency 02/24/2017  Aortic atherosclerosis ([place]) 01/28/2017   Type 2 diabetes mellitus without complication, without long-term current use of insulin ([place]) 12/18/2016   Class 1 obesity with serious comorbidity and body mass index (BMI) of 30.0 to 30.9 in adult 12/18/2016   Port catheter in place 08/21/2015   Acute appendicitis 07/22/2015   Appendicitis with peritonitis 07/22/2015   Acquired absence of right breast and nipple 07/05/2015   History of therapeutic radiation 07/05/2015   Recurrent breast cancer, right ([place]) 02/15/2015   Chemotherapy-induced peripheral neuropathy ([place]) 02/08/2015   Macromastia 01/06/2015   Anemia in neoplastic disease 12/21/2014   Cough    Breast cancer of upper-outer quadrant of right female breast ([place]) 07/22/2014   OSA on CPAP 06/02/2013   Type 2 diabetes mellitus with diabetic neuropathy, with long-term current use of insulin ([place]) 06/02/2013   Lap Roux Y Gastric Bypass Oct 2014 11/03/2012   Depression 09/10/2012   hyperlipidemia 09/10/2012   REFERRING DIAG: K59.00 (ICD-10-CM) - Constipation, unspecified constipation type   THERAPY DIAG:  Muscle weakness (generalized)   Other lack of coordination   Constipation, unspecified constipation type   Rationale for Evaluation and Treatment Rehabilitation   ONSET DATE: 11/2021   SUBJECTIVE:                                                                                                                                                                                             SUBJECTIVE STATEMENT: I have fallen due to a railing not as steady and going down steps on a step. I am using the squatty potty. I use the commode 2 times per day and not leakage.    PAIN:  Are you having pain? Yes NPRS scale: 0/10 Pain location: Anal   Pain type: dull Pain description: intermittent    Aggravating factors: bowel movement when hard to get out Relieving factors: having a bowel movement   PRECAUTIONS: Other: Metastatic Breast Cancer  with history of radiation therapy 04/05/15-05/19/15;    WEIGHT BEARING RESTRICTIONS No   FALLS:  Has patient fallen in last 6 months? No   LIVING ENVIRONMENT: Lives with: lives with their spouse     OCCUPATION: retired   PLOF: Independent   PATIENT GOALS have a regular bowel movement   PERTINENT HISTORY:  Metastatic breast cancer; diabetes, stage 4 kidney  disease, GASTRIC ROUX-EN-Y   BOWEL MOVEMENT Pain with bowel movement: Yes, when her stool does not move through the rectum Type of bowel movement:Type (Bristol Stool Scale) Type 4 recently, Frequency daily/2-3 times per day, Strain Yes, and Splinting she used to but not anymore ; sometimes strains Fully empty rectum: No  Leakage: No Pads: No Fiber supplement: miralax URINATION Pain with urination: No Fully empty bladder: Yes:   Stream: Strong Urgency: No Frequency: average Leakage:  none Pads: No       OBJECTIVE:    DIAGNOSTIC FINDINGS:  Anal rectal manometry shows normal internal and external anal sphincter pressures, rectal hyposensitivity likely due to chronic constipation, and evidence for dyssynergic defecation.   PATIENT SURVEYS:  PFIQ-7 22; CRAIQ-7 29   COGNITION:            Overall cognitive status: Within functional limits for tasks assessed                          SENSATION:             Light touch: Appears intact            Proprioception: Appears intact     GAIT: Assistive device utilized: Quad cane small base Level of assistance: Complete Independence                  POSTURE: No Significant postural limitations               PELVIC ALIGNMENT:   LUMBARAROM/PROM   A/PROM A/PROM  eval  Flexion Decreased by 25%  Extension Decreased by 25%  Right lateral flexion Decreased by 25%  Left lateral flexion Decreased by 25%  Right rotation Decreased by 25%  Left rotation Decreased by 25%   (Blank rows = not tested)   LOWER EXTREMITY ROM:   Passive ROM Right eval Left eval  Hip flexion 110 110  Hip internal rotation 5 10  Hip external rotation 45 50   (Blank rows = not tested)        PALPATION:   General  after bowel movement she has to wipe many times.                  External Perineal Exam stool located on the outside of the rectum                             Internal Pelvic Floor no tightness or tenderness   Patient confirms identification and approves PT to assess internal pelvic floor and treatment Yes   PELVIC MMT:   MMT eval 09/17/2021  Internal Anal Sphincter 3/5 3/5  External Anal Sphincter 4/5 4/5  Puborectalis 4/5 4/5  (Blank rows = not tested)         TODAY'S TREATMENT  09/17/2021 Exercises: Stretches/mobility: Strengthening:sitting marching with green band around thighs with pelvic floor contraction 30x Sitting hip abduction with  green band and anal contraction 30x Sitting knee extension with green band and ball between knees with anal contraction 10x each leg Sit to stand with pelvic floor contraction and breathing out 10x    08/27/2021 Neuromuscular re-education: Pelvic floor contraction training: supine anal contraction holding for 5 sec 15x with ball squeeze Down training:diaphragmatic breathing with red band around the lower rib cage to open up  then do in sitting Exercises: Stretches/mobility:single knee to chest  holding 15 sec 2 times each leg Piriformis stretch holding 30 sec 2 time each leg with pushing knee down in supine Strengthening:sitting marching with red band around thighs with pelvic floor contraction 30x Sitting hip abduction with red band and anal contraction 30x Sitting knee extension with red band and ball between knees with anal contraction 10x each leg Sit to  stand with pelvic floor contraction and breathing out 10x Therapeutic activities: Functional strengthening activities:Educated patient on how to toilet correctly with knees above hips, correct breath, contract the anus after bowel movement      PATIENT EDUCATION: Education details: Access Code: [email or web address], toileting Person educated: Patient Education method: Explanation, Demonstration, Tactile cues, Verbal cues, and Handouts Education comprehension: verbalized understanding, returned demonstration, verbal cues required, tactile cues required, and needs further education         HOME EXERCISE PROGRAM: 08/27/2021 Access Code: [email or web address] URL: [email or web address] Date: 08/27/2021 Prepared by: Earlie Counts   Exercises - Seated Diaphragmatic Breathing  - 1 x daily - 7 x weekly - 1 sets - 10 reps - Supine Single Knee to Chest  - 1 x daily - 7 x weekly - 1 sets - 2 reps - 15 sec hold - Supine Figure 4 Piriformis Stretch  - 1 x daily - 7 x weekly - 1 sets - 2 reps - 30 sec hold - Supine Hip Adduction Isometric with Ball  - 1 x daily - 7 x weekly - 1 sets - 10 reps - 5 sec hold - Seated March with Resistance  - 1 x daily - 3 x weekly - 1 sets - 20 reps - Seated Pelvic Floor Contraction with Hip Abduction and Resistance Loop  - 1 x daily - 3 x weekly - 3 sets - 10 reps - Seated Knee Extension with Resistance  - 1 x daily - 3 x weekly - 2 sets - 10 reps   ASSESSMENT:   CLINICAL IMPRESSION: Patient is a 71 y.o. female who was seen today for physical therapy  treatment for constipation. Patient reports no difficulty with  having a bowel movement. She is not straining. She goes 2 times per day. Patient is using a Physiological scientist. She is happy with her progress and ready for discharge. She is independent with her current HEP. She attends other therapy for her balance and general strength.   OBJECTIVE IMPAIRMENTS decreased coordination, decreased strength, and pain.    ACTIVITY LIMITATIONS toileting   PARTICIPATION LIMITATIONS: community activity   PERSONAL FACTORS Metastatic breast cancer; diabetes, stage 4 kidney  disease, GASTRIC ROUX-EN-Y are also affecting patient's functional outcome.    REHAB POTENTIAL: Good   CLINICAL DECISION MAKING: Evolving/moderate complexity   EVALUATION COMPLEXITY: Moderate     GOALS: Goals reviewed with patient? Yes   SHORT TERM GOALS: Target date: 09/21/2021   Patient is independent with HEP to relax her pelvic floor.  Baseline: Goal status: Met 09/17/2021     LONG TERM GOALS: Target date: 11/16/2021    Patient is able to push the therapist finger out of the anal canal due to improved coordination of the muscles.  Baseline:  Goal status: deferred since doing well.    2.  Patient strength of pelvic floor is 4/5 with holding for 10 seconds.  Baseline:  Goal status: deferred since doing well.    3.  Patient is able to have a bowel movement without having to wipe more than 3 times due to improved strength of pelvic floor.  Baseline:  Goal status: Met 09/17/2021   4.  Patient is able to not strain with a bowel movement due to improve coordination of her muscles and correct technique.  Baseline:  Goal status: Met 09/17/2021     PLAN: PT FREQUENCY: 1x/week   PT DURATION: 12 weeks   PLANNED INTERVENTIONS: Therapeutic exercises, Therapeutic activity, Neuromuscular re-education, Patient/Family education, Self Care, Biofeedback, and  Manual therapy   PLAN FOR NEXT SESSION: discharge due to doing well.     Earlie Counts, PT 09/17/21 9:19 AM    PHYSICAL THERAPY  DISCHARGE SUMMARY  Visits from Start of Care: 3  Current functional level related to goals / functional outcomes: See above.    Remaining deficits: See above.    Education / Equipment: HEP   Patient agrees to discharge. Patient goals were met. Patient is being discharged due to being pleased with the current functional level. Thank you for the referral. Earlie Counts, PT 09/17/21 9:19 AM

## 2021-09-19 ENCOUNTER — Ambulatory Visit (INDEPENDENT_AMBULATORY_CARE_PROVIDER_SITE_OTHER): Payer: Medicare PPO | Admitting: Podiatry

## 2021-09-19 ENCOUNTER — Encounter: Payer: Self-pay | Admitting: Podiatry

## 2021-09-19 DIAGNOSIS — B351 Tinea unguium: Secondary | ICD-10-CM | POA: Diagnosis not present

## 2021-09-19 DIAGNOSIS — H3582 Retinal ischemia: Secondary | ICD-10-CM | POA: Diagnosis not present

## 2021-09-19 DIAGNOSIS — M2011 Hallux valgus (acquired), right foot: Secondary | ICD-10-CM

## 2021-09-19 DIAGNOSIS — E113513 Type 2 diabetes mellitus with proliferative diabetic retinopathy with macular edema, bilateral: Secondary | ICD-10-CM | POA: Diagnosis not present

## 2021-09-19 DIAGNOSIS — M79675 Pain in left toe(s): Secondary | ICD-10-CM | POA: Diagnosis not present

## 2021-09-19 DIAGNOSIS — E1149 Type 2 diabetes mellitus with other diabetic neurological complication: Secondary | ICD-10-CM | POA: Diagnosis not present

## 2021-09-19 DIAGNOSIS — H31093 Other chorioretinal scars, bilateral: Secondary | ICD-10-CM | POA: Diagnosis not present

## 2021-09-19 DIAGNOSIS — N184 Chronic kidney disease, stage 4 (severe): Secondary | ICD-10-CM | POA: Diagnosis not present

## 2021-09-19 DIAGNOSIS — M79674 Pain in right toe(s): Secondary | ICD-10-CM

## 2021-09-19 DIAGNOSIS — M21611 Bunion of right foot: Secondary | ICD-10-CM | POA: Diagnosis not present

## 2021-09-19 DIAGNOSIS — E1140 Type 2 diabetes mellitus with diabetic neuropathy, unspecified: Secondary | ICD-10-CM

## 2021-09-19 NOTE — Progress Notes (Signed)
This patient returns to my office for at risk foot care.  This patient requires this care by a professional since this patient will be at risk due to having diabetic neuropathy.    This patient is unable to cut nails herself since the patient cannot reach her nails.These nails are painful walking and wearing shoes.  This patient presents for at risk foot care today.  General Appearance  Alert, conversant and in no acute stress.  Vascular  Dorsalis pedis and posterior tibial  pulses are palpable  bilaterally.  Capillary return is within normal limits  bilaterally. Temperature is within normal limits  bilaterally.  Neurologic  Senn-Weinstein monofilament wire test absent  bilaterally. Muscle power within normal limits bilaterally.  Nails Thick disfigured discolored nails with subungual debris  hallux toenails bilaterally.   No evidence of bacterial infection or drainage bilaterally.  Orthopedic  No limitations of motion  feet .  No crepitus or effusions noted.  No bony pathology or digital deformities noted.  HAV.    Hammer toes  Skin  normotropic skin with no porokeratosis noted bilaterally.  No signs of infections or ulcers noted.     Onychomycosis  Pain in right toes  Pain in left toes  HAV  B/L  Hammer toes  B/L.  Consent was obtained for treatment procedures.   Mechanical debridement of nails 1-5  bilaterally performed with a nail nipper.  Filed with dremel without incident.     Return office visit   10 weeks                  Told patient to return for periodic foot care and evaluation due to potential at risk complications.   Darlene [name] DPM  

## 2021-09-20 ENCOUNTER — Encounter: Payer: Self-pay | Admitting: Hematology and Oncology

## 2021-09-20 DIAGNOSIS — R809 Proteinuria, unspecified: Secondary | ICD-10-CM | POA: Insufficient documentation

## 2021-09-20 DIAGNOSIS — G609 Hereditary and idiopathic neuropathy, unspecified: Secondary | ICD-10-CM | POA: Insufficient documentation

## 2021-09-20 NOTE — Progress Notes (Signed)
71 y.o. G1P0010 Married White or Caucasian Not Hispanic or Latino female here for annual exam.  Several weeks ago for several days she had some vaginal spotting.   Sexually active, without penetration, husband with h/o prostate cancer.   She has metastatic breast cancer, mets in her liver have been getting larger. Currently on chemotherapy. She is tired, but overall is feeling okay.   She has some neuropathy.     Patient's last menstrual period was 10/22/2007.          Sexually active: Yes.    The current method of family planning is post menopausal status.    Exercising: Yes.     PT Smoker:  no  Health Maintenance: Pap:   07-16-17 neg HPV HR neg, 07-05-14 neg HPV HR neg  History of abnormal Pap:  no MMG:  10/30/20 Bi-rads 2 benign  BMD:   08/31/20 low bone density. Colonoscopy: 2020 normal  TDaP:  02/06/16 Gardasil: n/a   reports that she has never smoked. She has never used smokeless tobacco. She reports current alcohol use of about 1.0 - 2.0 standard drink of alcohol per week. She reports that she does not use drugs.  Past Medical History:  Diagnosis Date   Anemia    hx of - during 1st round chemo   Anxiety    Arthritis    in fingers, shoulders   Back pain    Balance problem    Breast cancer ([place])    Breast cancer of upper-outer quadrant of right female breast ([place]) 07/22/2014   Bunion, right foot    CHF (congestive heart failure) (HCC)    Chronic kidney disease    creatininei levels high per pt    Clumsiness    Constipation    Depression    Diabetes mellitus without complication ([place])    84+ years type 2    Diabetic retinopathy ([place])    torn retina   Eating disorder    binge eating   Epistaxis 08/26/2014   Fatty liver    Foot drop, left foot    HCAP (healthcare-associated pneumonia) 12/18/2014   Heart murmur    never had any problems   History of radiation therapy 04/05/15-05/19/15   right chest wall was treated to 50.4 Gy in 28 fractions, right mastectomy scar was  treated to 10 Gy in 5 fractions   Hyperlipidemia    Hypertension    Joint pain    Multiple food allergies    Neuromuscular disorder ([place])    diabetic neuropathy   Obesity    Obstructive sleep apnea on CPAP    does not use cpap all the time has lost 160 lbs    Ovarian cyst rupture    possible   Pneumonia    Pneumonia 11/2014   Radiation 04/05/15-05/19/15   right chest wall 50.4 Gy, mastectomy scar 10 Gy   Sleep apnea    Thyroid nodule 06/2014    Past Surgical History:  Procedure Laterality Date   ANAL RECTAL MANOMETRY N/A 08/01/2021   Procedure: ANO RECTAL MANOMETRY;  Surgeon: Thornton Park, MD;  Location: [facility];  Service: Gastroenterology;  Laterality: N/A;   APPENDECTOMY     BIOPSY THYROID Bilateral 06/2014   2 nodules - Benign    BREAST LUMPECTOMY WITH RADIOACTIVE SEED AND SENTINEL LYMPH NODE BIOPSY Right 12/27/2014   Procedure: BREAST LUMPECTOMY WITH RADIOACTIVE SEED AND SENTINEL LYMPH NODE BIOPSY;  Surgeon: Stark Klein, MD;  Location: [place];  Service: General;  Laterality:  Right;   BREAST REDUCTION SURGERY Bilateral 01/06/2015   Procedure: BILATERAL BREAST REDUCTION, RIGHT ONCOPLASTIC RECONSTRUCTION),LEFT BREAST REDUCTION FOR SYMMETRY;  Surgeon: [name], MD;  Location: [place];  Service: Plastics;  Laterality: Bilateral;   BREATH TEK H PYLORI N/A 09/15/2012   Procedure: BREATH TEK H PYLORI;  Surgeon: Pedro Earls, MD;  Location: [facility];  Service: General;  Laterality: N/A;   BUNIONECTOMY Left 12/2013   CARPAL TUNNEL RELEASE Right    CARPAL TUNNEL RELEASE Left    CATARACT EXTRACTION     6/19   COLONOSCOPY     DUPUYTREN CONTRACTURE RELEASE     GASTRIC ROUX-EN-Y N/A 11/02/2012   Procedure: LAPAROSCOPIC ROUX-EN-Y GASTRIC;  Surgeon: Pedro Earls, MD;  Location: [facility];  Service: General;  Laterality: N/A;   IR GENERIC HISTORICAL  03/18/2016   IR US GUIDE VASC ACCESS LEFT 03/18/2016 Markus Daft, MD [facility]   IR GENERIC  HISTORICAL  03/18/2016   IR FLUORO GUIDE CV LINE LEFT 03/18/2016 Markus Daft, MD [facility]   LAPAROSCOPIC APPENDECTOMY N/A 07/22/2015   Procedure: APPENDECTOMY LAPAROSCOPIC;  Surgeon: Michael Boston, MD;  Location: [facility];  Service: General;  Laterality: N/A;   MASTECTOMY Right 02/15/2015   modified   MODIFIED MASTECTOMY Right 02/15/2015   Procedure: RIGHT MODIFIED RADICAL MASTECTOMY;  Surgeon: Stark Klein, MD;  Location: [place];  Service: General;  Laterality: Right;   ORIF WRIST FRACTURE Left 05/11/2019   Procedure: OPEN REDUCTION INTERNAL FIXATION (ORIF) left distal radius fracture;  Surgeon: Renette Butters, MD;  Location: [facility];  Service: Orthopedics;  Laterality: Left;  with block   PORT-A-CATH REMOVAL Left 10/10/2015   Procedure: PORT REMOVAL;  Surgeon: Stark Klein, MD;  Location: [place];  Service: General;  Laterality: Left;   PORTACATH PLACEMENT Left 08/02/2014   Procedure: INSERTION PORT-A-CATH;  Surgeon: Stark Klein, MD;  Location: [place];  Service: General;  Laterality: Left;   PORTACATH PLACEMENT N/A 11/05/2016   Procedure: INSERTION PORT-A-CATH;  Surgeon: Stark Klein, MD;  Location: [facility];  Service: General;  Laterality: N/A;   RETINAL LASER PROCEDURE     retina tear on left eye   ROTATOR CUFF REPAIR Right 2005   SCAR REVISION Right 12/08/2015   Procedure: COMPLEX REPAIR RIGHT CHEST 10-15CM;  Surgeon: [name], MD;  Location: [facility];  Service: Plastics;  Laterality: Right;   TOE SURGERY  1970s   bone spur   TRIGGER FINGER RELEASE     x3    Current Outpatient Medications  Medication Sig Dispense Refill   Accu-Chek Softclix Lancets lancets 1 each by Other route 4 (four) times daily. Test blood sugars 4 x times daily 200 each 1   aspirin EC 81 MG tablet Take 1 tablet (81 mg total) by mouth daily. 90 tablet 3   Bempedoic Acid-Ezetimibe (NEXLIZET) 180-10 MG TABS Take 1 tablet by mouth daily. 90 tablet 3   Blood Glucose Calibration  (ACCU-CHEK GUIDE CONTROL) LIQD 1 Bottle by In Vitro route as needed. 1 each 0   Blood Glucose Monitoring Suppl (ACCU-CHEK GUIDE) w/Device KIT 1 each by Does not apply route 4 (four) times daily. Use to test blood sugars 4x a day 1 kit 0   buPROPion (WELLBUTRIN SR) 200 MG 12 hr tablet Take one tablet by mouth once daily. 90 tablet 0   CALCIUM CITRATE-VITAMIN D PO Take 1 tablet by mouth 3 (three) times daily.      carvedilol (COREG) 6.25 MG tablet  Take 1 tablet (6.25 mg total) by mouth 2 (two) times daily. 180 tablet 3   cholecalciferol (VITAMIN D) 1000 units tablet Take 1,000 Units by mouth daily.     Continuous Blood Gluc Sensor (FREESTYLE LIBRE 2 SENSOR) MISC      Cyanocobalamin (VITAMIN B-12 PO) Take 1 tablet by mouth every 14 (fourteen) days.     diphenhydrAMINE (BENADRYL) 25 MG tablet Take 25 mg by mouth daily as needed for allergies or sleep.     glucose blood (ACCU-CHEK GUIDE) test strip Use Accu Chek test strips to check blood sugar 4 times daily. DX:E11.21 400 each 0   insulin aspart (NOVOLOG) 100 UNIT/ML injection Inject into the skin daily as needed for high blood sugar. Per patient, sliding scale with meals as needed     insulin glargine (LANTUS SOLOSTAR) 100 UNIT/ML Solostar Pen Inject 8 Units into the skin daily. 7 mL 3   KERENDIA 20 MG TABS Take 1 tablet by mouth daily. 30 tablet 6   loratadine (CLARITIN) 10 MG tablet Take 10 mg by mouth as needed for allergies.     melatonin 5 MG TABS Take 5 mg by mouth at bedtime.     omeprazole (PRILOSEC) 40 MG capsule Take 1 capsule (40 mg total) by mouth daily. 30 capsule 1   Pediatric Multivitamins-Iron (FLINTSTONES PLUS IRON) chewable tablet Chew 1 tablet by mouth 2 (two) times daily.     polyethylene glycol (MIRALAX / GLYCOLAX) 17 g packet Take 17 g by mouth daily.     Probiotic Product ([employer] PROBIOTIC PO) Take 1 capsule by mouth daily.      Propylene Glycol (SYSTANE BALANCE OP) Place 1-2 drops into both eyes 3 (three) times daily as needed  (for dry eyes).      SURE COMFORT PEN NEEDLES 32G X 4 MM MISC Use as instructed to inject insulin and Victoza daily. 200 each 0   tobramycin (TOBREX) 0.3 % ophthalmic ointment Place 1 application into both eyes as needed (Takes day before eye injections).     vortioxetine HBr (TRINTELLIX) 20 MG TABS tablet Take 1 tablet (20 mg total) by mouth at bedtime. 90 tablet 0   HYDROcodone-acetaminophen (NORCO) 5-325 MG tablet Take 1 tablet by mouth every 6 (six) hours as needed for severe pain. (Patient not taking: Reported on 10/04/2021) 10 tablet 0   No current facility-administered medications for this visit.   Facility-Administered Medications Ordered in Other Visits  Medication Dose Route Frequency Provider Last Rate Last Admin   acetaminophen (TYLENOL) 325 MG tablet            diphenhydrAMINE (BENADRYL) 50 MG/ML injection            famotidine (PEPCID) 20-0.9 MG/50ML-% IVPB            palonosetron (ALOXI) 0.25 MG/5ML injection             Family History  Problem Relation Age of Onset   CVA Mother    Heart disease Mother    Sudden death Mother    Diabetes Father    Heart failure Father    Hypertension Father    Kidney disease Father    Obesity Father    Hypertension Sister    Diabetes Brother    Breast cancer Paternal Grandmother 16   Diabetes Paternal Uncle    Ovarian cancer Neg Hx     Review of Systems  Genitourinary:  Positive for vaginal bleeding.  All other systems reviewed and are negative.  Exam:   BP 112/70   Pulse 78   Ht _0  (1.753 m)   Wt 179 lb (81.2 kg)   LMP 10/22/2007   SpO2 99%   BMI 26.43 kg/m   Weight change: _1 @ Height:   Height: _2  (175.3 cm)  Ht Readings from Last 3 Encounters:  10/04/21 _3  (1.753 m)  09/27/21 _4  (1.753 m)  08/31/21 _5  (1.753 m)    General appearance: alert, cooperative and appears stated age Head: Normocephalic, without obvious abnormality, atraumatic Neck: no adenopathy, supple, symmetrical, trachea  midline and thyroid normal to inspection and palpation Breasts: normal appearance, no masses or tenderness Abdomen: soft, non-tender; non distended,  no masses,  no organomegaly Extremities: extremities normal, atraumatic, no cyanosis or edema Skin: Skin color, texture, turgor normal. No rashes or lesions Lymph nodes: Cervical, supraclavicular, and axillary nodes normal. No abnormal inguinal nodes palpated Neurologic: Grossly normal   Pelvic: External genitalia:  no lesions              Urethra:  normal appearing urethra with no masses, tenderness or lesions              Bartholins and Skenes: normal                 Vagina: normal appearing vagina with normal color and discharge, no lesions              Cervix: no lesions               Bimanual Exam:  Uterus:   no masses or tenderness              Adnexa: no mass, fullness, tenderness               Rectovaginal: Confirms               Anus:  normal sphincter tone, no lesions  Gae Dry chaperoned for the exam.  1. Encounter for breast and pelvic examination Discussed breast self exam Discussed calcium and vit D intake Mammogram next month Colonoscopy UTD  2. Postmenopausal bleeding - US PELVIS TRANSVAGINAL NON-OB (TV ONLY); Future and MD visit - Cytology - PAP  3. Screening for cervical cancer - Cytology - PAP

## 2021-09-21 ENCOUNTER — Other Ambulatory Visit: Payer: Self-pay | Admitting: *Deleted

## 2021-09-21 ENCOUNTER — Other Ambulatory Visit: Payer: Self-pay

## 2021-09-21 ENCOUNTER — Inpatient Hospital Stay: Payer: Medicare PPO | Attending: Oncology

## 2021-09-21 ENCOUNTER — Inpatient Hospital Stay: Payer: Medicare PPO

## 2021-09-21 VITALS — BP 130/63 | HR 58 | Temp 98.2°F | Resp 17 | Wt 177.2 lb

## 2021-09-21 DIAGNOSIS — Z171 Estrogen receptor negative status [ER-]: Secondary | ICD-10-CM | POA: Insufficient documentation

## 2021-09-21 DIAGNOSIS — Z95828 Presence of other vascular implants and grafts: Secondary | ICD-10-CM

## 2021-09-21 DIAGNOSIS — Z452 Encounter for adjustment and management of vascular access device: Secondary | ICD-10-CM | POA: Diagnosis not present

## 2021-09-21 DIAGNOSIS — C787 Secondary malignant neoplasm of liver and intrahepatic bile duct: Secondary | ICD-10-CM | POA: Diagnosis not present

## 2021-09-21 DIAGNOSIS — C50811 Malignant neoplasm of overlapping sites of right female breast: Secondary | ICD-10-CM | POA: Insufficient documentation

## 2021-09-21 DIAGNOSIS — C50411 Malignant neoplasm of upper-outer quadrant of right female breast: Secondary | ICD-10-CM

## 2021-09-21 DIAGNOSIS — Z5112 Encounter for antineoplastic immunotherapy: Secondary | ICD-10-CM | POA: Insufficient documentation

## 2021-09-21 DIAGNOSIS — E1165 Type 2 diabetes mellitus with hyperglycemia: Secondary | ICD-10-CM | POA: Diagnosis not present

## 2021-09-21 DIAGNOSIS — D6481 Anemia due to antineoplastic chemotherapy: Secondary | ICD-10-CM | POA: Diagnosis not present

## 2021-09-21 DIAGNOSIS — D701 Agranulocytosis secondary to cancer chemotherapy: Secondary | ICD-10-CM | POA: Insufficient documentation

## 2021-09-21 LAB — CBC WITH DIFFERENTIAL/PLATELET
Abs Immature Granulocytes: 0.01 10*3/uL (ref 0.00–0.07)
Basophils Absolute: 0 10*3/uL (ref 0.0–0.1)
Basophils Relative: 0 %
Eosinophils Absolute: 0.2 10*3/uL (ref 0.0–0.5)
Eosinophils Relative: 5 %
HCT: 28.2 % — ABNORMAL LOW (ref 36.0–46.0)
Hemoglobin: 9.9 g/dL — ABNORMAL LOW (ref 12.0–15.0)
Immature Granulocytes: 0 %
Lymphocytes Relative: 25 %
Lymphs Abs: 0.9 10*3/uL (ref 0.7–4.0)
MCH: 34.6 pg — ABNORMAL HIGH (ref 26.0–34.0)
MCHC: 35.1 g/dL (ref 30.0–36.0)
MCV: 98.6 fL (ref 80.0–100.0)
Monocytes Absolute: 0.3 10*3/uL (ref 0.1–1.0)
Monocytes Relative: 9 %
Neutro Abs: 2.2 10*3/uL (ref 1.7–7.7)
Neutrophils Relative %: 61 %
Platelets: 201 10*3/uL (ref 150–400)
RBC: 2.86 MIL/uL — ABNORMAL LOW (ref 3.87–5.11)
RDW: 14.2 % (ref 11.5–15.5)
WBC: 3.6 10*3/uL — ABNORMAL LOW (ref 4.0–10.5)
nRBC: 0 % (ref 0.0–0.2)

## 2021-09-21 LAB — COMPREHENSIVE METABOLIC PANEL
ALT: 13 U/L (ref 0–44)
AST: 26 U/L (ref 15–41)
Albumin: 3.8 g/dL (ref 3.5–5.0)
Alkaline Phosphatase: 65 U/L (ref 38–126)
Anion gap: 4 — ABNORMAL LOW (ref 5–15)
BUN: 34 mg/dL — ABNORMAL HIGH (ref 8–23)
CO2: 28 mmol/L (ref 22–32)
Calcium: 8.7 mg/dL — ABNORMAL LOW (ref 8.9–10.3)
Chloride: 108 mmol/L (ref 98–111)
Creatinine, Ser: 1.62 mg/dL — ABNORMAL HIGH (ref 0.44–1.00)
GFR, Estimated: 34 mL/min — ABNORMAL LOW (ref >60)
Glucose, Bld: 248 mg/dL — ABNORMAL HIGH (ref 70–99)
Potassium: 4.2 mmol/L (ref 3.5–5.1)
Sodium: 140 mmol/L (ref 135–145)
Total Bilirubin: 0.3 mg/dL (ref 0.3–1.2)
Total Protein: 6 g/dL — ABNORMAL LOW (ref 6.5–8.1)

## 2021-09-21 LAB — PHOSPHORUS: Phosphorus: 4.1 mg/dL (ref 2.5–4.6)

## 2021-09-21 LAB — MAGNESIUM: Magnesium: 1.9 mg/dL (ref 1.7–2.4)

## 2021-09-21 MED ORDER — SODIUM CHLORIDE 0.9 % IV SOLN
7.5000 mg/kg | Freq: Once | INTRAVENOUS | Status: AC
  Administered 2021-09-21: 600 mg via INTRAVENOUS
  Filled 2021-09-21: qty 60

## 2021-09-21 MED ORDER — FAMOTIDINE IN NACL 20-0.9 MG/50ML-% IV SOLN
20.0000 mg | Freq: Once | INTRAVENOUS | Status: AC
  Administered 2021-09-21: 20 mg via INTRAVENOUS
  Filled 2021-09-21: qty 50

## 2021-09-21 MED ORDER — HEPARIN SOD (PORK) LOCK FLUSH 100 UNIT/ML IV SOLN
500.0000 [IU] | Freq: Once | INTRAVENOUS | Status: AC | PRN
  Administered 2021-09-21: 500 [IU]

## 2021-09-21 MED ORDER — PALONOSETRON HCL INJECTION 0.25 MG/5ML
0.2500 mg | Freq: Once | INTRAVENOUS | Status: AC
  Administered 2021-09-21: 0.25 mg via INTRAVENOUS
  Filled 2021-09-21: qty 5

## 2021-09-21 MED ORDER — DIPHENHYDRAMINE HCL 50 MG/ML IJ SOLN: 25.0000 mg | Freq: Once | INTRAMUSCULAR | Status: DC

## 2021-09-21 MED ORDER — SODIUM CHLORIDE 0.9% FLUSH
10.0000 mL | Freq: Once | INTRAVENOUS | Status: AC
  Administered 2021-09-21: 10 mL

## 2021-09-21 MED ORDER — ACETAMINOPHEN 325 MG PO TABS
650.0000 mg | ORAL_TABLET | Freq: Once | ORAL | Status: AC
  Administered 2021-09-21: 650 mg via ORAL
  Filled 2021-09-21: qty 2

## 2021-09-21 MED ORDER — SODIUM CHLORIDE 0.9 % IV SOLN
150.0000 mg | Freq: Once | INTRAVENOUS | Status: AC
  Administered 2021-09-21: 150 mg via INTRAVENOUS
  Filled 2021-09-21: qty 150

## 2021-09-21 MED ORDER — SODIUM CHLORIDE 0.9% FLUSH
10.0000 mL | INTRAVENOUS | Status: DC | PRN
  Administered 2021-09-21: 10 mL

## 2021-09-21 MED ORDER — SODIUM CHLORIDE 0.9 % IV SOLN
10.0000 mg | Freq: Once | INTRAVENOUS | Status: AC
  Administered 2021-09-21: 10 mg via INTRAVENOUS
  Filled 2021-09-21: qty 10

## 2021-09-21 MED ORDER — SODIUM CHLORIDE 0.9 % IV SOLN: Freq: Once | INTRAVENOUS | Status: AC

## 2021-09-21 MED ORDER — CETIRIZINE HCL 10 MG/ML IV SOLN
10.0000 mg | Freq: Once | INTRAVENOUS | Status: AC
  Administered 2021-09-21: 10 mg via INTRAVENOUS
  Filled 2021-09-21: qty 1

## 2021-09-21 NOTE — Progress Notes (Signed)
Per Dr. Lindi Adie, substitute IV Certirizine for IV diphenhydramine; pt does not tol IV diphenhydramine (see patient messages from 09/20/21).  Kennith Center, Pharm.D., CPP 09/21/2021'@12'$ :44 PM

## 2021-09-21 NOTE — Progress Notes (Signed)
Per MD okay to treat today with Crt 1.62

## 2021-09-21 NOTE — Patient Instructions (Signed)
[facility]  Discharge Instructions: Thank you for choosing [facility] to provide your oncology and hematology care.   If you have a lab appointment with the Cancer Center, please go directly to the Cancer Center and check in at the registration area.   Wear comfortable clothing and clothing appropriate for easy access to any Portacath or PICC line.   We strive to give you quality time with your provider. You may need to reschedule your appointment if you arrive late (15 or more minutes).  Arriving late affects you and other patients whose appointments are after yours.  Also, if you miss three or more appointments without notifying the office, you may be dismissed from the clinic at the provider's discretion.      For prescription refill requests, have your pharmacy contact our office and allow 72 hours for refills to be completed.    Today you received the following chemotherapy and/or immunotherapy agents: trodelvy      To help prevent nausea and vomiting after your treatment, we encourage you to take your nausea medication as directed.  BELOW ARE SYMPTOMS THAT SHOULD BE REPORTED IMMEDIATELY: *FEVER GREATER THAN 100.4 F (38 C) OR HIGHER *CHILLS OR SWEATING *NAUSEA AND VOMITING THAT IS NOT CONTROLLED WITH YOUR NAUSEA MEDICATION *UNUSUAL SHORTNESS OF BREATH *UNUSUAL BRUISING OR BLEEDING *URINARY PROBLEMS (pain or burning when urinating, or frequent urination) *BOWEL PROBLEMS (unusual diarrhea, constipation, pain near the anus) TENDERNESS IN MOUTH AND THROAT WITH OR WITHOUT PRESENCE OF ULCERS (sore throat, sores in mouth, or a toothache) UNUSUAL RASH, SWELLING OR PAIN  UNUSUAL VAGINAL DISCHARGE OR ITCHING   Items with * indicate a potential emergency and should be followed up as soon as possible or go to the Emergency Department if any problems should occur.  Please show the CHEMOTHERAPY ALERT CARD or IMMUNOTHERAPY ALERT CARD at check-in to  the Emergency Department and triage nurse.  Should you have questions after your visit or need to cancel or reschedule your appointment, please contact [facility]  Dept: [phone]  and follow the prompts.  Office hours are 8:00 a.m. to 4:30 p.m. Monday - Friday. Please note that voicemails left after 4:00 p.m. may not be returned until the following business day.  We are closed weekends and major holidays. You have access to a nurse at all times for urgent questions. Please call the main number to the clinic Dept: [phone] and follow the prompts.   For any non-urgent questions, you may also contact your provider using MyChart. We now offer e-Visits for anyone 18 and older to request care online for non-urgent symptoms. For details visit [email or web address].   Also download the MyChart app! Go to the app store, search "MyChart", open the app, select [facility], and log in with your MyChart username and password.  Masks are optional in the cancer centers. If you would like for your care team to wear a mask while they are taking care of you, please let them know. You may have one support person who is at least 69 years old accompany you for your appointments. 

## 2021-09-22 DIAGNOSIS — Z171 Estrogen receptor negative status [ER-]: Secondary | ICD-10-CM | POA: Diagnosis not present

## 2021-09-22 DIAGNOSIS — C50411 Malignant neoplasm of upper-outer quadrant of right female breast: Secondary | ICD-10-CM | POA: Diagnosis not present

## 2021-09-22 DIAGNOSIS — C787 Secondary malignant neoplasm of liver and intrahepatic bile duct: Secondary | ICD-10-CM | POA: Diagnosis not present

## 2021-09-24 ENCOUNTER — Other Ambulatory Visit: Payer: Self-pay | Admitting: Hematology and Oncology

## 2021-09-24 DIAGNOSIS — C50411 Malignant neoplasm of upper-outer quadrant of right female breast: Secondary | ICD-10-CM

## 2021-09-26 ENCOUNTER — Encounter: Payer: Medicare PPO | Admitting: Physical Therapy

## 2021-09-26 DIAGNOSIS — M25572 Pain in left ankle and joints of left foot: Secondary | ICD-10-CM | POA: Diagnosis not present

## 2021-09-26 DIAGNOSIS — I10 Essential (primary) hypertension: Secondary | ICD-10-CM | POA: Diagnosis not present

## 2021-09-26 DIAGNOSIS — Z9181 History of falling: Secondary | ICD-10-CM | POA: Diagnosis not present

## 2021-09-26 DIAGNOSIS — M25671 Stiffness of right ankle, not elsewhere classified: Secondary | ICD-10-CM | POA: Diagnosis not present

## 2021-09-26 DIAGNOSIS — R269 Unspecified abnormalities of gait and mobility: Secondary | ICD-10-CM | POA: Diagnosis not present

## 2021-09-26 DIAGNOSIS — M6259 Muscle wasting and atrophy, not elsewhere classified, multiple sites: Secondary | ICD-10-CM | POA: Diagnosis not present

## 2021-09-26 DIAGNOSIS — E118 Type 2 diabetes mellitus with unspecified complications: Secondary | ICD-10-CM | POA: Diagnosis not present

## 2021-09-26 DIAGNOSIS — F339 Major depressive disorder, recurrent, unspecified: Secondary | ICD-10-CM | POA: Diagnosis not present

## 2021-09-26 NOTE — Progress Notes (Signed)
Patient Care Team: Isaac Bliss, Rayford Halsted, MD as PCP - General (Internal Medicine) Larey Dresser, MD as PCP - Advanced Heart Failure (Cardiology) Stark Klein, MD as Consulting Physician (General Surgery) Jacelyn Pi, MD as Consulting Physician (Endocrinology) [name], MD as Consulting Physician (Plastic Surgery) Gery Pray, MD as Consulting Physician (Radiation Oncology) Juanita Craver, MD as Consulting Physician (Gastroenterology) Alda Berthold, DO as Consulting Physician (Neurology) Starlyn Skeans, MD as Consulting Physician (Family Medicine) Sherlynn Stalls, MD as Consulting Physician (Ophthalmology) Salvadore Dom, MD as Consulting Physician (Obstetrics and Gynecology) Raina Mina, RPH-CPP (Pharmacist) Nicholas Lose, MD as Consulting Physician (Hematology and Oncology)  DIAGNOSIS:  Encounter Diagnoses  Name Primary?   Malignant neoplasm of upper-outer quadrant of right breast in female, estrogen receptor negative ([place])    Malignant neoplasm of upper-outer quadrant of right female breast, unspecified estrogen receptor status ([place])     SUMMARY OF ONCOLOGIC HISTORY: Oncology History  Breast cancer of upper-outer quadrant of right female breast ([place])  07/19/2014 Initial Biopsy   (R) breast needle biopsy (10:00): IDC, DCIS, grade 2. ER-, PR-, HER2+ (ratio 2.42). Ki67 90%.    07/22/2014 Initial Diagnosis   Breast cancer of upper-outer quadrant of right female breast   08/08/2014 - 11/21/2014 Neo-Adjuvant Chemotherapy   Taxotere/Carbo/Herceptin/Perjeta x 6 cycles.    12/12/2014 - 10/02/2015 Chemotherapy   Maintenance Herceptin (to complete 1 year of therapy).    12/27/2014 Surgery   (R) lumpectomy with SL[facility]): IDC, grade 2, spanning 4 cm, associated high grade DCIS. Negative margins. 1/3 SLN (+).   ER/PR repeated and remain negative.   ypT2, ypN1a: Stage IIB   01/06/2015 Surgery   Bilateral breast mammoplasty ([place]): Right breast  path with intralymphatic emboli of ductal carcinoma. Left breast benign.    02/02/2015 Imaging   CT chest: No findings of metastatic disease in the chest. Right axillary fluid density lesion likely postoperative seroma or hematoma. Small left subpectoral nodes warrant followup attention.   02/02/2015 Imaging   Bone scan: No scintigraphic evidence of osseous metastatic disease   02/16/2015 Surgery   (R) mastectomy and ALND [facility]): Scattered foci of high grade DCIS, scattered intralymphatic tumor emboli. Margins negative. 0/15 LNs.    04/05/2015 - 05/19/2015 Radiation Therapy   Adjuvant XRT ([place]). The right chest wall was treated to 50.4 Gy in 28 fractions at 1.8 Gy per fraction.  The right mastectomy scar was treated to 10 Gy in 5 fractions at 2 Gy per fraction.    12/06/2015 PET scan   Right subpectoral lymph node measuring 9 mm and exhibits malignant range FDG uptake, suspicious for metastatic adenopathy. Subcutaneous nodule within the upper left chest wall exhibits malignant range uptake, which is indeterminate; this is at the site of previous Port-A-Cath and is favored to represent postsurgical change. Indeterminate left adrenal nodules which exhibit mild uptake, favored to represent benign adenoma.   08/11/2017 - 11/03/2017 Chemotherapy   The patient had ado-trastuzumab emtansine (KADCYLA) 320 mg in sodium chloride 0.9 % 250 mL chemo infusion, 3.6 mg/kg = 320 mg, Intravenous, Once, 4 of 5 cycles Administration: 320 mg (08/12/2017), 320 mg (09/01/2017), 320 mg (09/23/2017)  for chemotherapy treatment.    11/18/2017 - 08/30/2020 Chemotherapy   The patient had trastuzumab (HERCEPTIN) 546 mg in sodium chloride 0.9 % 250 mL chemo infusion, 6 mg/kg = 546 mg (100 % of original dose 6 mg/kg), Intravenous,  Once, 2 of 2 cycles Dose modification: 6 mg/kg (original dose 6 mg/kg, Cycle 1,  Reason: Provider Judgment) Administration: 546 mg (11/18/2017), 546 mg (12/16/2017) pertuzumab (PERJETA) 420 mg in  sodium chloride 0.9 % 250 mL chemo infusion, 420 mg (100 % of original dose 420 mg), Intravenous, Once, 15 of 15 cycles Dose modification: 420 mg (original dose 420 mg, Cycle 18), 420 mg (original dose 420 mg, Cycle 27) Administration: 420 mg (10/06/2018), 420 mg (10/27/2018), 420 mg (11/18/2018), 420 mg (12/08/2018), 420 mg (12/29/2018), 420 mg (01/19/2019), 420 mg (02/16/2019), 420 mg (03/16/2019), 420 mg (04/13/2019), 420 mg (06/08/2019), 420 mg (07/06/2019), 420 mg (08/03/2019), 420 mg (08/31/2019), 420 mg (09/28/2019), 420 mg (05/19/2019) trastuzumab-anns (KANJINTI) 546 mg in sodium chloride 0.9 % 250 mL chemo infusion, 6 mg/kg = 546 mg (100 % of original dose 6 mg/kg), Intravenous,  Once, 30 of 30 cycles Dose modification: 6 mg/kg (original dose 6 mg/kg, Cycle 3, Reason: Other (see comments), Comment: requested step therapy by [facility] insurance), 4 mg/kg (original dose 6 mg/kg, Cycle 9, Reason: Provider Judgment), 6 mg/kg (original dose 6 mg/kg, Cycle 17, Reason: Provider Judgment), 4 mg/kg (original dose 6 mg/kg, Cycle 27, Reason: Provider Judgment), 6 mg/kg (original dose 6 mg/kg, Cycle 27, Reason: Provider Judgment) Administration: 546 mg (01/13/2018), 546 mg (02/10/2018), 546 mg (03/03/2018), 546 mg (03/24/2018), 546 mg (04/14/2018), 546 mg (05/05/2018), 357 mg (05/26/2018), 357 mg (06/09/2018), 357 mg (06/23/2018), 357 mg (07/07/2018), 357 mg (07/21/2018), 357 mg (08/04/2018), 357 mg (08/18/2018), 357 mg (09/01/2018), 546 mg (09/15/2018), 546 mg (10/06/2018), 546 mg (10/27/2018), 546 mg (11/18/2018), 546 mg (12/08/2018), 546 mg (12/29/2018), 546 mg (01/19/2019), 546 mg (02/16/2019), 480 mg (03/16/2019), 483 mg (04/13/2019), 450 mg (06/08/2019), 450 mg (07/06/2019), 450 mg (08/03/2019), 450 mg (08/31/2019), 450 mg (09/28/2019)  for chemotherapy treatment.    12/31/2017 - 09/01/2018 Chemotherapy   The patient had PACLitaxel-protein bound (ABRAXANE) chemo infusion 175 mg, 80 mg/m2 = 175 mg (100 % of original dose 80 mg/m2), Intravenous,  Once, 5  of 10 cycles Dose modification: 80 mg/m2 (original dose 80 mg/m2, Cycle 11) Administration: 175 mg (07/07/2018), 175 mg (07/21/2018), 175 mg (08/04/2018), 175 mg (08/18/2018), 175 mg (09/01/2018) PACLitaxel-protein bound (ABRAXANE) chemo infusion 200 mg, 100 mg/m2 = 200 mg, Intravenous, Once, 10 of 10 cycles Dose modification: 80 mg/m2 (original dose 100 mg/m2, Cycle 2, Reason: Provider Judgment), 80 mg/m2 (original dose 100 mg/m2, Cycle 2, Reason: Provider Judgment) Administration: 200 mg (12/31/2017), 200 mg (01/06/2018), 175 mg (01/20/2018), 175 mg (01/27/2018), 175 mg (02/10/2018), 175 mg (03/03/2018), 175 mg (03/10/2018), 175 mg (03/24/2018), 175 mg (04/07/2018), 175 mg (04/14/2018), 175 mg (04/28/2018), 175 mg (05/05/2018), 175 mg (05/26/2018), 175 mg (06/09/2018), 175 mg (05/19/2018)  for chemotherapy treatment.    10/05/2020 - 01/18/2021 Chemotherapy   Patient is on Treatment Plan : BREAST METASTATIC fam-trastuzumab deruxtecan-nxki (Enhertu) q21d     02/19/2021 - 05/14/2021 Chemotherapy   Patient is on Treatment Plan : BREAST Trastuzumab q21d x 13 cycles     06/20/2021 - 08/01/2021 Chemotherapy   Patient is on Treatment Plan : BREAST METASTATIC fam-trastuzumab deruxtecan-nxki (Enhertu) q21d     07/25/2021 -  Chemotherapy   Patient is on Treatment Plan : BREAST METASTATIC Sacituzumab govitecan-hziy Darlene [name]) D1,8 q21d     08/31/2021 - 09/21/2021 Chemotherapy   Patient is on Treatment Plan : BREAST METASTATIC Sacituzumab govitecan-hziy Darlene [name]) q21d     Metastasis to liver ([place])  08/04/2017 Initial Diagnosis   Metastasis to liver ([place])   08/12/2017 - 10/14/2017 Chemotherapy   The patient had ado-trastuzumab emtansine (KADCYLA) 320 mg in sodium chloride 0.9 % 250 mL  chemo infusion, 3.6 mg/kg = 320 mg, Intravenous, Once, 4 of 5 cycles Administration: 320 mg (08/12/2017), 320 mg (09/01/2017), 320 mg (09/23/2017)  for chemotherapy treatment.    11/18/2017 - 08/30/2020 Chemotherapy   The patient had trastuzumab  (HERCEPTIN) 546 mg in sodium chloride 0.9 % 250 mL chemo infusion, 6 mg/kg = 546 mg (100 % of original dose 6 mg/kg), Intravenous,  Once, 2 of 2 cycles Dose modification: 6 mg/kg (original dose 6 mg/kg, Cycle 1, Reason: Provider Judgment) Administration: 546 mg (11/18/2017), 546 mg (12/16/2017) pertuzumab (PERJETA) 420 mg in sodium chloride 0.9 % 250 mL chemo infusion, 420 mg (100 % of original dose 420 mg), Intravenous, Once, 15 of 15 cycles Dose modification: 420 mg (original dose 420 mg, Cycle 18), 420 mg (original dose 420 mg, Cycle 27) Administration: 420 mg (10/06/2018), 420 mg (10/27/2018), 420 mg (11/18/2018), 420 mg (12/08/2018), 420 mg (12/29/2018), 420 mg (01/19/2019), 420 mg (02/16/2019), 420 mg (03/16/2019), 420 mg (04/13/2019), 420 mg (06/08/2019), 420 mg (07/06/2019), 420 mg (08/03/2019), 420 mg (08/31/2019), 420 mg (09/28/2019), 420 mg (05/19/2019) trastuzumab-anns (KANJINTI) 546 mg in sodium chloride 0.9 % 250 mL chemo infusion, 6 mg/kg = 546 mg (100 % of original dose 6 mg/kg), Intravenous,  Once, 30 of 30 cycles Dose modification: 6 mg/kg (original dose 6 mg/kg, Cycle 3, Reason: Other (see comments), Comment: requested step therapy by [facility] insurance), 4 mg/kg (original dose 6 mg/kg, Cycle 9, Reason: Provider Judgment), 6 mg/kg (original dose 6 mg/kg, Cycle 17, Reason: Provider Judgment), 4 mg/kg (original dose 6 mg/kg, Cycle 27, Reason: Provider Judgment), 6 mg/kg (original dose 6 mg/kg, Cycle 27, Reason: Provider Judgment) Administration: 546 mg (01/13/2018), 546 mg (02/10/2018), 546 mg (03/03/2018), 546 mg (03/24/2018), 546 mg (04/14/2018), 546 mg (05/05/2018), 357 mg (05/26/2018), 357 mg (06/09/2018), 357 mg (06/23/2018), 357 mg (07/07/2018), 357 mg (07/21/2018), 357 mg (08/04/2018), 357 mg (08/18/2018), 357 mg (09/01/2018), 546 mg (09/15/2018), 546 mg (10/06/2018), 546 mg (10/27/2018), 546 mg (11/18/2018), 546 mg (12/08/2018), 546 mg (12/29/2018), 546 mg (01/19/2019), 546 mg (02/16/2019), 480 mg (03/16/2019), 483 mg  (04/13/2019), 450 mg (06/08/2019), 450 mg (07/06/2019), 450 mg (08/03/2019), 450 mg (08/31/2019), 450 mg (09/28/2019)  for chemotherapy treatment.      CHIEF COMPLIANT: Cycle 1 day 8 [place]  INTERVAL HISTORY: Darlene [name] is a 71 year old with the above mention. She reports to clinic today for a follow-up and treatment. She states that she lost her hair in over the last 2 weeks. She takes anti nausea pill every morning. Yesterday she vomit her lunch up. She having constipation but she is taking Murelax. She is always tired.  ALLERGIES:  is allergic to antihistamines, diphenhydramine-type; nsaids; and tape.  MEDICATIONS:  Current Outpatient Medications  Medication Sig Dispense Refill   Accu-Chek Softclix Lancets lancets 1 each by Other route 4 (four) times daily. Test blood sugars 4 x times daily 200 each 1   aspirin EC 81 MG tablet Take 1 tablet (81 mg total) by mouth daily. 90 tablet 3   Bempedoic Acid-Ezetimibe (NEXLIZET) 180-10 MG TABS Take 1 tablet by mouth daily. 90 tablet 3   Blood Glucose Calibration (ACCU-CHEK GUIDE CONTROL) LIQD 1 Bottle by In Vitro route as needed. 1 each 0   Blood Glucose Monitoring Suppl (ACCU-CHEK GUIDE) w/Device KIT 1 each by Does not apply route 4 (four) times daily. Use to test blood sugars 4x a day 1 kit 0   buPROPion (WELLBUTRIN SR) 200 MG 12 hr tablet Take one tablet by mouth once daily.  90 tablet 0   CALCIUM CITRATE-VITAMIN D PO Take 1 tablet by mouth 3 (three) times daily.      carvedilol (COREG) 6.25 MG tablet Take 1 tablet (6.25 mg total) by mouth 2 (two) times daily. 180 tablet 3   cholecalciferol (VITAMIN D) 1000 units tablet Take 1,000 Units by mouth daily.     Continuous Blood Gluc Sensor (FREESTYLE LIBRE 2 SENSOR) MISC      Cyanocobalamin (VITAMIN B-12 PO) Take 1 tablet by mouth every 14 (fourteen) days.     diphenhydrAMINE (BENADRYL) 25 MG tablet Take 25 mg by mouth daily as needed for allergies or sleep.     glucose blood (ACCU-CHEK  GUIDE) test strip Use Accu Chek test strips to check blood sugar 4 times daily. DX:E11.21 400 each 0   HYDROcodone-acetaminophen (NORCO) 5-325 MG tablet Take 1 tablet by mouth every 6 (six) hours as needed for severe pain. 10 tablet 0   insulin aspart (NOVOLOG) 100 UNIT/ML injection Inject into the skin daily as needed for high blood sugar. Per patient, sliding scale with meals as needed     insulin glargine (LANTUS SOLOSTAR) 100 UNIT/ML Solostar Pen Inject 8 Units into the skin daily. 7 mL 3   KERENDIA 20 MG TABS Take 1 tablet by mouth daily. 30 tablet 6   loratadine (CLARITIN) 10 MG tablet Take 10 mg by mouth as needed for allergies.     melatonin 5 MG TABS Take 5 mg by mouth at bedtime.     omeprazole (PRILOSEC) 40 MG capsule Take 1 capsule (40 mg total) by mouth daily. 30 capsule 1   Pediatric Multivitamins-Iron (FLINTSTONES PLUS IRON) chewable tablet Chew 1 tablet by mouth 2 (two) times daily.     polyethylene glycol (MIRALAX / GLYCOLAX) 17 g packet Take 17 g by mouth daily.     Probiotic Product ([employer] PROBIOTIC PO) Take 1 capsule by mouth daily.      Propylene Glycol (SYSTANE BALANCE OP) Place 1-2 drops into both eyes 3 (three) times daily as needed (for dry eyes).      SURE COMFORT PEN NEEDLES 32G X 4 MM MISC Use as instructed to inject insulin and Victoza daily. 200 each 0   tobramycin (TOBREX) 0.3 % ophthalmic ointment Place 1 application into both eyes as needed (Takes day before eye injections).     vortioxetine HBr (TRINTELLIX) 20 MG TABS tablet Take 1 tablet (20 mg total) by mouth at bedtime. 90 tablet 0   No current facility-administered medications for this visit.   Facility-Administered Medications Ordered in Other Visits  Medication Dose Route Frequency Provider Last Rate Last Admin   acetaminophen (TYLENOL) 325 MG tablet            diphenhydrAMINE (BENADRYL) 50 MG/ML injection            famotidine (PEPCID) 20-0.9 MG/50ML-% IVPB            palonosetron (ALOXI) 0.25 MG/5ML  injection             PHYSICAL EXAMINATION: ECOG PERFORMANCE STATUS: 1 - Symptomatic but completely ambulatory  Vitals:   09/27/21 1125  BP: (!) 135/58  Pulse: 63  Resp: 14  Temp: 97.9 F (36.6 C)  SpO2: 99%   Filed Weights   09/27/21 1125  Weight: 176 lb 4.8 oz (80 kg)      LABORATORY DATA:  I have reviewed the data as listed    Latest Ref Rng & Units 09/27/2021   10:51 AM 09/21/2021  11:33 AM 09/07/2021   10:46 AM  CMP  Glucose 70 - 99 mg/dL 127  248  175   BUN 8 - 23 mg/dL 42  34  46   Creatinine 0.44 - 1.00 mg/dL 1.77  1.62  1.80   Sodium 135 - 145 mmol/L 141  140  141   Potassium 3.5 - 5.1 mmol/L 4.7  4.2  4.5   Chloride 98 - 111 mmol/L 108  108  107   CO2 22 - 32 mmol/L _0 Calcium 8.9 - 10.3 mg/dL 9.3  8.7  9.3   Total Protein 6.5 - 8.1 g/dL 6.3  6.0  6.0   Total Bilirubin 0.3 - 1.2 mg/dL 0.4  0.3  0.3   Alkaline Phos 38 - 126 U/L 61  65  68   AST 15 - 41 U/L _1 ALT 0 - 44 U/L _2 Lab Results  Component Value Date   WBC 2.9 (L) 09/27/2021   HGB 9.9 (L) 09/27/2021   HCT 28.7 (L) 09/27/2021   MCV 98.6 09/27/2021   PLT 196 09/27/2021   NEUTROABS 1.5 (L) 09/27/2021    ASSESSMENT & PLAN:  Breast cancer of upper-outer quadrant of right female breast ([place]) 07/19/2014: Right breast cancer grade 2 IDC ER/PR negative HER2 positive Ki-67 90% treated with neoadjuvant chemo with TCHP x6 cycles followed by Herceptin maintenance, right lumpectomy had residual disease 4 cm 1/3 lymph node positive, ER/PR negative, adjuvant radiation and capecitabine completed 09/27/2015 12/06/2015: Recurrence: Right subpectoral lymph node and subcutaneous nodule, liver mass (biopsy adenocarcinoma consistent with upper GI or pancreaticobiliary primary, HER2 negative, ER negative) July 2019: Metastatic disease: TDM 1 followed by Herceptin maintenance, pertuzumab added 10/06/2018, Abraxane 12/31/2017-09/01/2018, Herceptin and pertuzumab maintenance   Prior  treatment: Enhertu started 10/05/2020 switched to Herceptin maintenance 02/19/2021 switched to Enhertu at higher dosage x 3 cycles discontinued 08/01/2021    02/04/2021: PET CT scan: Complete metabolic response, no evidence of hypermetabolic disease in the liver (mass measured 2.7 cm previously is now 1.5 cm with no uptake)    08/20/2021: CT CAP: Increased size of the metastatic liver lesions went from 6 cm to 7.5 cm and hepatic dome lesion is now 17 mm previously 10 mm, stable size of the right subpectoral lymph node, no new sites of metastatic disease, stable upper abdominal lymph nodes probably benign  ------------------------------------------------------------------------------------------------------------------------------------------- Current treatment: Darlene [name] cycle 2 day 8 Liver biopsy was performed 08/30/2021: Poorly differentiated carcinoma consistent with metastatic breast cancer.  ER negative, PR negative, GCDFP negative, HER2 1+ Molecular testing: NTRK 1: Amplified, genomic LOH: High, TMB: 4 MSI stable (PIK 3 CA: Not detected, BRCA not detected)  Based on the molecular testing it appears of Darlene [name] is the best choice.  I discussed the molecular pathology report in great detail and provided her with a copy of the result.   Toxicities: 1.  Mild nausea 2. mild fatigue 3.  Chemo-induced anemia: Monitoring, hemoglobin 9.9 4.  Mild leukopenia: WBC 2.9 5.  Alopecia   Return to clinic tomorrow for cycle 2-day 8    Orders Placed This Encounter  Procedures   CEA (IN HOUSE-[facility])    Standing Status:   Standing    Number of Occurrences:   100    Standing Expiration Date:   09/28/2022   The patient has a good understanding of the overall plan. she agrees with it. she will call  with any problems that may develop before the next visit here. Total time spent: 30 mins including face to face time and time spent for planning, charting and co-ordination of care   Harriette Ohara,  MD 09/27/21    I Gardiner Coins am scribing for Dr. Lindi Adie  I have reviewed the above documentation for accuracy and completeness, and I agree with the above.

## 2021-09-27 ENCOUNTER — Inpatient Hospital Stay (HOSPITAL_BASED_OUTPATIENT_CLINIC_OR_DEPARTMENT_OTHER): Payer: Medicare PPO | Admitting: Hematology and Oncology

## 2021-09-27 ENCOUNTER — Other Ambulatory Visit: Payer: Self-pay

## 2021-09-27 ENCOUNTER — Inpatient Hospital Stay: Payer: Medicare PPO

## 2021-09-27 ENCOUNTER — Encounter (HOSPITAL_COMMUNITY): Payer: Self-pay

## 2021-09-27 DIAGNOSIS — D6481 Anemia due to antineoplastic chemotherapy: Secondary | ICD-10-CM | POA: Diagnosis not present

## 2021-09-27 DIAGNOSIS — Z171 Estrogen receptor negative status [ER-]: Secondary | ICD-10-CM

## 2021-09-27 DIAGNOSIS — C50411 Malignant neoplasm of upper-outer quadrant of right female breast: Secondary | ICD-10-CM | POA: Diagnosis not present

## 2021-09-27 DIAGNOSIS — Z5112 Encounter for antineoplastic immunotherapy: Secondary | ICD-10-CM | POA: Diagnosis not present

## 2021-09-27 DIAGNOSIS — D701 Agranulocytosis secondary to cancer chemotherapy: Secondary | ICD-10-CM | POA: Diagnosis not present

## 2021-09-27 DIAGNOSIS — Z452 Encounter for adjustment and management of vascular access device: Secondary | ICD-10-CM | POA: Diagnosis not present

## 2021-09-27 DIAGNOSIS — C787 Secondary malignant neoplasm of liver and intrahepatic bile duct: Secondary | ICD-10-CM | POA: Diagnosis not present

## 2021-09-27 DIAGNOSIS — C50811 Malignant neoplasm of overlapping sites of right female breast: Secondary | ICD-10-CM | POA: Diagnosis not present

## 2021-09-27 DIAGNOSIS — Z95828 Presence of other vascular implants and grafts: Secondary | ICD-10-CM

## 2021-09-27 LAB — CMP (CANCER CENTER ONLY)
ALT: 13 U/L (ref 0–44)
AST: 23 U/L (ref 15–41)
Albumin: 3.9 g/dL (ref 3.5–5.0)
Alkaline Phosphatase: 61 U/L (ref 38–126)
Anion gap: 5 (ref 5–15)
BUN: 42 mg/dL — ABNORMAL HIGH (ref 8–23)
CO2: 28 mmol/L (ref 22–32)
Calcium: 9.3 mg/dL (ref 8.9–10.3)
Chloride: 108 mmol/L (ref 98–111)
Creatinine: 1.77 mg/dL — ABNORMAL HIGH (ref 0.44–1.00)
GFR, Estimated: 31 mL/min — ABNORMAL LOW (ref >60)
Glucose, Bld: 127 mg/dL — ABNORMAL HIGH (ref 70–99)
Potassium: 4.7 mmol/L (ref 3.5–5.1)
Sodium: 141 mmol/L (ref 135–145)
Total Bilirubin: 0.4 mg/dL (ref 0.3–1.2)
Total Protein: 6.3 g/dL — ABNORMAL LOW (ref 6.5–8.1)

## 2021-09-27 LAB — CBC WITH DIFFERENTIAL (CANCER CENTER ONLY)
Abs Immature Granulocytes: 0.02 10*3/uL (ref 0.00–0.07)
Basophils Absolute: 0 10*3/uL (ref 0.0–0.1)
Basophils Relative: 1 %
Eosinophils Absolute: 0.1 10*3/uL (ref 0.0–0.5)
Eosinophils Relative: 3 %
HCT: 28.7 % — ABNORMAL LOW (ref 36.0–46.0)
Hemoglobin: 9.9 g/dL — ABNORMAL LOW (ref 12.0–15.0)
Immature Granulocytes: 1 %
Lymphocytes Relative: 35 %
Lymphs Abs: 1 10*3/uL (ref 0.7–4.0)
MCH: 34 pg (ref 26.0–34.0)
MCHC: 34.5 g/dL (ref 30.0–36.0)
MCV: 98.6 fL (ref 80.0–100.0)
Monocytes Absolute: 0.3 10*3/uL (ref 0.1–1.0)
Monocytes Relative: 9 %
Neutro Abs: 1.5 10*3/uL — ABNORMAL LOW (ref 1.7–7.7)
Neutrophils Relative %: 51 %
Platelet Count: 196 10*3/uL (ref 150–400)
RBC: 2.91 MIL/uL — ABNORMAL LOW (ref 3.87–5.11)
RDW: 13.3 % (ref 11.5–15.5)
WBC Count: 2.9 10*3/uL — ABNORMAL LOW (ref 4.0–10.5)
nRBC: 0 % (ref 0.0–0.2)

## 2021-09-27 MED ORDER — HEPARIN SOD (PORK) LOCK FLUSH 100 UNIT/ML IV SOLN
500.0000 [IU] | Freq: Once | INTRAVENOUS | Status: AC
  Administered 2021-09-27: 500 [IU]

## 2021-09-27 MED ORDER — SODIUM CHLORIDE 0.9% FLUSH
10.0000 mL | Freq: Once | INTRAVENOUS | Status: AC
  Administered 2021-09-27: 10 mL

## 2021-09-27 NOTE — Assessment & Plan Note (Signed)
07/19/2014: Right breast cancer grade 2 IDC ER/PR negative HER2 positive Ki-67 90% treated with neoadjuvant chemo with TCHP x6 cycles followed by Herceptin maintenance, right lumpectomy had residual disease 4 cm 1/3 lymph node positive, ER/PR negative, adjuvant radiation and capecitabine completed 09/27/2015 12/06/2015: Recurrence: Right subpectoral lymph node and subcutaneous nodule, liver mass (biopsy adenocarcinoma consistent with upper GI or pancreaticobiliary primary, HER2 negative, ER negative) July 2019: Metastatic disease: TDM 1 followed by Herceptin maintenance,pertuzumabadded 10/06/2018, Abraxane 12/31/2017-09/01/2018, Herceptin and pertuzumab maintenance  Priortreatment: Enhertu started 9/15/2022switched to Herceptin maintenance 1/30/2023switched to Enhertu at higher dosage x 3 cycles discontinued 08/01/2021   02/04/2021: PET CT scan: Complete metabolic response, no evidence of hypermetabolic disease in the liver (mass measured 2.7 cm previously is now 1.5 cm with no uptake)  08/20/2021: CT CAP: Increased size of the metastatic liver lesions went from 6 cm to 7.5 cm and hepatic dome lesion is now 17 mm previously 10 mm, stable size of the right subpectoral lymph node, no new sites of metastatic disease, stable upper abdominal lymph nodes probably benign ------------------------------------------------------------------------------------------------------------------------------------------- Current treatment: Darlene [name] cycle 1 day 8 Liver biopsy was performed 08/30/2021: Poorly differentiated carcinoma consistent with metastatic breast cancer.  ER negative, PR negative, GCDFP negative, HER2 1+ Molecular testing: NTRK 1: Amplified, genomic LOH: High, TMB: 4 MSI stable (PIK 3 CA: Not detected, BRCA not detected)  Based on the molecular testing it appears of Darlene [name] is the best choice.  Enhertu is still an option later on.  Toxicities: 1.  Mild nausea 2. mild fatigue 3.  Chemo-induced  anemia: Monitoring 4.  Mild leukopenia  Return to clinic in 3 weeks

## 2021-09-28 ENCOUNTER — Telehealth: Payer: Self-pay | Admitting: Hematology and Oncology

## 2021-09-28 ENCOUNTER — Inpatient Hospital Stay: Payer: Medicare PPO

## 2021-09-28 VITALS — BP 130/56 | HR 74 | Temp 98.1°F | Resp 17

## 2021-09-28 DIAGNOSIS — Z171 Estrogen receptor negative status [ER-]: Secondary | ICD-10-CM

## 2021-09-28 DIAGNOSIS — D6481 Anemia due to antineoplastic chemotherapy: Secondary | ICD-10-CM | POA: Diagnosis not present

## 2021-09-28 DIAGNOSIS — I10 Essential (primary) hypertension: Secondary | ICD-10-CM | POA: Diagnosis not present

## 2021-09-28 DIAGNOSIS — Z5112 Encounter for antineoplastic immunotherapy: Secondary | ICD-10-CM | POA: Diagnosis not present

## 2021-09-28 DIAGNOSIS — D701 Agranulocytosis secondary to cancer chemotherapy: Secondary | ICD-10-CM | POA: Diagnosis not present

## 2021-09-28 DIAGNOSIS — N1831 Chronic kidney disease, stage 3a: Secondary | ICD-10-CM | POA: Diagnosis not present

## 2021-09-28 DIAGNOSIS — C50811 Malignant neoplasm of overlapping sites of right female breast: Secondary | ICD-10-CM | POA: Diagnosis not present

## 2021-09-28 DIAGNOSIS — C50911 Malignant neoplasm of unspecified site of right female breast: Secondary | ICD-10-CM | POA: Diagnosis not present

## 2021-09-28 DIAGNOSIS — C787 Secondary malignant neoplasm of liver and intrahepatic bile duct: Secondary | ICD-10-CM | POA: Diagnosis not present

## 2021-09-28 DIAGNOSIS — Z452 Encounter for adjustment and management of vascular access device: Secondary | ICD-10-CM | POA: Diagnosis not present

## 2021-09-28 DIAGNOSIS — E7800 Pure hypercholesterolemia, unspecified: Secondary | ICD-10-CM | POA: Diagnosis not present

## 2021-09-28 DIAGNOSIS — E1165 Type 2 diabetes mellitus with hyperglycemia: Secondary | ICD-10-CM | POA: Diagnosis not present

## 2021-09-28 DIAGNOSIS — C50411 Malignant neoplasm of upper-outer quadrant of right female breast: Secondary | ICD-10-CM

## 2021-09-28 DIAGNOSIS — R809 Proteinuria, unspecified: Secondary | ICD-10-CM | POA: Diagnosis not present

## 2021-09-28 LAB — CEA (IN HOUSE-CHCC): CEA (CHCC-In House): 772 ng/mL — ABNORMAL HIGH (ref 0.00–5.00)

## 2021-09-28 MED ORDER — SODIUM CHLORIDE 0.9 % IV SOLN: Freq: Once | INTRAVENOUS | Status: AC

## 2021-09-28 MED ORDER — FAMOTIDINE IN NACL 20-0.9 MG/50ML-% IV SOLN
20.0000 mg | Freq: Once | INTRAVENOUS | Status: AC
  Administered 2021-09-28: 20 mg via INTRAVENOUS
  Filled 2021-09-28: qty 50

## 2021-09-28 MED ORDER — HEPARIN SOD (PORK) LOCK FLUSH 100 UNIT/ML IV SOLN
500.0000 [IU] | Freq: Once | INTRAVENOUS | Status: AC | PRN
  Administered 2021-09-28: 500 [IU]

## 2021-09-28 MED ORDER — SODIUM CHLORIDE 0.9 % IV SOLN
10.0000 mg | Freq: Once | INTRAVENOUS | Status: AC
  Administered 2021-09-28: 10 mg via INTRAVENOUS
  Filled 2021-09-28: qty 10

## 2021-09-28 MED ORDER — DIPHENHYDRAMINE HCL 50 MG/ML IJ SOLN: 50.0000 mg | Freq: Once | INTRAMUSCULAR | Status: DC

## 2021-09-28 MED ORDER — SODIUM CHLORIDE 0.9% FLUSH
10.0000 mL | INTRAVENOUS | Status: DC | PRN
  Administered 2021-09-28: 10 mL

## 2021-09-28 MED ORDER — CETIRIZINE HCL 10 MG/ML IV SOLN
10.0000 mg | Freq: Once | INTRAVENOUS | Status: AC
  Administered 2021-09-28: 10 mg via INTRAVENOUS
  Filled 2021-09-28: qty 1

## 2021-09-28 MED ORDER — ATROPINE SULFATE 1 MG/ML IV SOLN
0.5000 mg | Freq: Once | INTRAVENOUS | Status: AC | PRN
  Administered 2021-09-28: 0.5 mg via INTRAVENOUS
  Filled 2021-09-28: qty 1

## 2021-09-28 MED ORDER — ACETAMINOPHEN 325 MG PO TABS
650.0000 mg | ORAL_TABLET | Freq: Once | ORAL | Status: AC
  Administered 2021-09-28: 650 mg via ORAL
  Filled 2021-09-28: qty 2

## 2021-09-28 MED ORDER — SODIUM CHLORIDE 0.9 % IV SOLN
7.5000 mg/kg | Freq: Once | INTRAVENOUS | Status: AC
  Administered 2021-09-28: 600 mg via INTRAVENOUS
  Filled 2021-09-28: qty 60

## 2021-09-28 MED ORDER — SODIUM CHLORIDE 0.9 % IV SOLN
150.0000 mg | Freq: Once | INTRAVENOUS | Status: AC
  Administered 2021-09-28: 150 mg via INTRAVENOUS
  Filled 2021-09-28: qty 150

## 2021-09-28 MED ORDER — PALONOSETRON HCL INJECTION 0.25 MG/5ML
0.2500 mg | Freq: Once | INTRAVENOUS | Status: AC
  Administered 2021-09-28: 0.25 mg via INTRAVENOUS
  Filled 2021-09-28: qty 5

## 2021-09-28 NOTE — Patient Instructions (Addendum)
[facility] [facility] ONCOLOGY  Discharge Instructions: Thank you for choosing [facility] to provide your oncology and hematology care.   If you have a lab appointment with the [facility], please go directly to the [employer] and check in at the registration area.   Wear comfortable clothing and clothing appropriate for easy access to any Portacath or PICC line.   We strive to give you quality time with your provider. You may need to reschedule your appointment if you arrive late (15 or more minutes).  Arriving late affects you and other patients whose appointments are after yours.  Also, if you miss three or more appointments without notifying the office, you may be dismissed from the clinic at the provider's discretion.      For prescription refill requests, have your pharmacy contact our office and allow 72 hours for refills to be completed.    Today you received the following chemotherapy and/or immunotherapy agents: sacituzumab govitecan Ivette Loyal)      To help prevent nausea and vomiting after your treatment, we encourage you to take your nausea medication as directed.  BELOW ARE SYMPTOMS THAT SHOULD BE REPORTED IMMEDIATELY: *FEVER GREATER THAN 100.4 F (38 C) OR HIGHER *CHILLS OR SWEATING *NAUSEA AND VOMITING THAT IS NOT CONTROLLED WITH YOUR NAUSEA MEDICATION *UNUSUAL SHORTNESS OF BREATH *UNUSUAL BRUISING OR BLEEDING *URINARY PROBLEMS (pain or burning when urinating, or frequent urination) *BOWEL PROBLEMS (unusual diarrhea, constipation, pain near the anus) TENDERNESS IN MOUTH AND THROAT WITH OR WITHOUT PRESENCE OF ULCERS (sore throat, sores in mouth, or a toothache) UNUSUAL RASH, SWELLING OR PAIN  UNUSUAL VAGINAL DISCHARGE OR ITCHING   Items with * indicate a potential emergency and should be followed up as soon as possible or go to the Emergency Department if any problems should occur.  Please show the CHEMOTHERAPY ALERT CARD or IMMUNOTHERAPY  ALERT CARD at check-in to the Emergency Department and triage nurse.  Should you have questions after your visit or need to cancel or reschedule your appointment, please contact [facility]  Dept: [phone]  and follow the prompts.  Office hours are 8:00 a.m. to 4:30 p.m. Monday - Friday. Please note that voicemails left after 4:00 p.m. may not be returned until the following business day.  We are closed weekends and major holidays. You have access to a nurse at all times for urgent questions. Please call the main number to the clinic Dept: [phone] and follow the prompts.   For any non-urgent questions, you may also contact your provider using MyChart. We now offer e-Visits for anyone 64 and older to request care online for non-urgent symptoms. For details visit [email or web address].   Also download the MyChart app! Go to the app store, search "MyChart", open the app, select [facility], and log in with your MyChart username and password.  Masks are optional in the cancer centers. If you would like for your care team to wear a mask while they are taking care of you, please let them know. You may have one support person who is at least 69 years old accompany you for your appointments.

## 2021-09-28 NOTE — Telephone Encounter (Signed)
Scheduled appointments per WQ. Left voicemail.  

## 2021-10-02 DIAGNOSIS — E118 Type 2 diabetes mellitus with unspecified complications: Secondary | ICD-10-CM | POA: Diagnosis not present

## 2021-10-02 DIAGNOSIS — R269 Unspecified abnormalities of gait and mobility: Secondary | ICD-10-CM | POA: Diagnosis not present

## 2021-10-02 DIAGNOSIS — M25671 Stiffness of right ankle, not elsewhere classified: Secondary | ICD-10-CM | POA: Diagnosis not present

## 2021-10-02 DIAGNOSIS — M25572 Pain in left ankle and joints of left foot: Secondary | ICD-10-CM | POA: Diagnosis not present

## 2021-10-02 DIAGNOSIS — I10 Essential (primary) hypertension: Secondary | ICD-10-CM | POA: Diagnosis not present

## 2021-10-02 DIAGNOSIS — Z9181 History of falling: Secondary | ICD-10-CM | POA: Diagnosis not present

## 2021-10-02 DIAGNOSIS — F339 Major depressive disorder, recurrent, unspecified: Secondary | ICD-10-CM | POA: Diagnosis not present

## 2021-10-02 DIAGNOSIS — M6259 Muscle wasting and atrophy, not elsewhere classified, multiple sites: Secondary | ICD-10-CM | POA: Diagnosis not present

## 2021-10-04 ENCOUNTER — Other Ambulatory Visit (HOSPITAL_COMMUNITY)
Admission: RE | Admit: 2021-10-04 | Discharge: 2021-10-04 | Disposition: A | Discharge status: Home / Self Care | Payer: Medicare PPO | Source: Ambulatory Visit | Attending: Obstetrics and Gynecology | Admitting: Obstetrics and Gynecology

## 2021-10-04 ENCOUNTER — Ambulatory Visit (INDEPENDENT_AMBULATORY_CARE_PROVIDER_SITE_OTHER): Payer: Medicare PPO | Admitting: Obstetrics and Gynecology

## 2021-10-04 ENCOUNTER — Encounter: Payer: Self-pay | Admitting: Obstetrics and Gynecology

## 2021-10-04 VITALS — BP 112/70 | HR 78 | Ht 69.0 in | Wt 179.0 lb

## 2021-10-04 DIAGNOSIS — Z124 Encounter for screening for malignant neoplasm of cervix: Secondary | ICD-10-CM | POA: Diagnosis not present

## 2021-10-04 DIAGNOSIS — N950 Postmenopausal bleeding: Secondary | ICD-10-CM | POA: Insufficient documentation

## 2021-10-04 DIAGNOSIS — C7981 Secondary malignant neoplasm of breast: Secondary | ICD-10-CM

## 2021-10-04 DIAGNOSIS — Z01419 Encounter for gynecological examination (general) (routine) without abnormal findings: Secondary | ICD-10-CM

## 2021-10-04 DIAGNOSIS — Z9289 Personal history of other medical treatment: Secondary | ICD-10-CM

## 2021-10-04 DIAGNOSIS — Z9189 Other specified personal risk factors, not elsewhere classified: Secondary | ICD-10-CM | POA: Diagnosis not present

## 2021-10-05 ENCOUNTER — Inpatient Hospital Stay: Payer: Medicare PPO

## 2021-10-05 ENCOUNTER — Inpatient Hospital Stay: Payer: Medicare PPO | Admitting: Physician Assistant

## 2021-10-09 DIAGNOSIS — M25572 Pain in left ankle and joints of left foot: Secondary | ICD-10-CM | POA: Diagnosis not present

## 2021-10-09 DIAGNOSIS — M25671 Stiffness of right ankle, not elsewhere classified: Secondary | ICD-10-CM | POA: Diagnosis not present

## 2021-10-09 DIAGNOSIS — F339 Major depressive disorder, recurrent, unspecified: Secondary | ICD-10-CM | POA: Diagnosis not present

## 2021-10-09 DIAGNOSIS — Z9181 History of falling: Secondary | ICD-10-CM | POA: Diagnosis not present

## 2021-10-09 DIAGNOSIS — M6259 Muscle wasting and atrophy, not elsewhere classified, multiple sites: Secondary | ICD-10-CM | POA: Diagnosis not present

## 2021-10-09 DIAGNOSIS — E118 Type 2 diabetes mellitus with unspecified complications: Secondary | ICD-10-CM | POA: Diagnosis not present

## 2021-10-09 DIAGNOSIS — R269 Unspecified abnormalities of gait and mobility: Secondary | ICD-10-CM | POA: Diagnosis not present

## 2021-10-09 DIAGNOSIS — I10 Essential (primary) hypertension: Secondary | ICD-10-CM | POA: Diagnosis not present

## 2021-10-10 ENCOUNTER — Encounter: Payer: Medicare PPO | Admitting: Physical Therapy

## 2021-10-11 LAB — CYTOLOGY - PAP: Diagnosis: NEGATIVE

## 2021-10-11 MED FILL — Fosaprepitant Dimeglumine For IV Infusion 150 MG (Base Eq): INTRAVENOUS | Qty: 5 | Status: AC

## 2021-10-11 MED FILL — Dexamethasone Sodium Phosphate Inj 100 MG/10ML: INTRAMUSCULAR | Qty: 1 | Status: AC

## 2021-10-11 NOTE — Progress Notes (Signed)
Patient Care Team: Isaac Bliss, Rayford Halsted, MD as PCP - General (Internal Medicine) Larey Dresser, MD as PCP - Advanced Heart Failure (Cardiology) Stark Klein, MD as Consulting Physician (General Surgery) Jacelyn Pi, MD as Consulting Physician (Endocrinology) [name], MD as Consulting Physician (Plastic Surgery) Gery Pray, MD as Consulting Physician (Radiation Oncology) Juanita Craver, MD as Consulting Physician (Gastroenterology) Alda Berthold, DO as Consulting Physician (Neurology) Starlyn Skeans, MD as Consulting Physician (Family Medicine) Sherlynn Stalls, MD as Consulting Physician (Ophthalmology) Salvadore Dom, MD as Consulting Physician (Obstetrics and Gynecology) Raina Mina, RPH-CPP (Pharmacist) Nicholas Lose, MD as Consulting Physician (Hematology and Oncology)  DIAGNOSIS:  Encounter Diagnoses  Name Primary?   Metastasis to liver (HCC) Yes   Malignant neoplasm of upper-outer quadrant of right breast in female, estrogen receptor negative ([place])    Malignant neoplasm of upper-outer quadrant of right female breast, unspecified estrogen receptor status ([place])     SUMMARY OF ONCOLOGIC HISTORY: Oncology History  Breast cancer of upper-outer quadrant of right female breast ([place])  07/19/2014 Initial Biopsy   (R) breast needle biopsy (10:00): IDC, DCIS, grade 2. ER-, PR-, HER2+ (ratio 2.42). Ki67 90%.    07/22/2014 Initial Diagnosis   Breast cancer of upper-outer quadrant of right female breast   08/08/2014 - 11/21/2014 Neo-Adjuvant Chemotherapy   Taxotere/Carbo/Herceptin/Perjeta x 6 cycles.    12/12/2014 - 10/02/2015 Chemotherapy   Maintenance Herceptin (to complete 1 year of therapy).    12/27/2014 Surgery   (R) lumpectomy with SL[facility]): IDC, grade 2, spanning 4 cm, associated high grade DCIS. Negative margins. 1/3 SLN (+).   ER/PR repeated and remain negative.   ypT2, ypN1a: Stage IIB   01/06/2015 Surgery   Bilateral breast  mammoplasty ([place]): Right breast path with intralymphatic emboli of ductal carcinoma. Left breast benign.    02/02/2015 Imaging   CT chest: No findings of metastatic disease in the chest. Right axillary fluid density lesion likely postoperative seroma or hematoma. Small left subpectoral nodes warrant followup attention.   02/02/2015 Imaging   Bone scan: No scintigraphic evidence of osseous metastatic disease   02/16/2015 Surgery   (R) mastectomy and ALND [facility]): Scattered foci of high grade DCIS, scattered intralymphatic tumor emboli. Margins negative. 0/15 LNs.    04/05/2015 - 05/19/2015 Radiation Therapy   Adjuvant XRT ([place]). The right chest wall was treated to 50.4 Gy in 28 fractions at 1.8 Gy per fraction.  The right mastectomy scar was treated to 10 Gy in 5 fractions at 2 Gy per fraction.    12/06/2015 PET scan   Right subpectoral lymph node measuring 9 mm and exhibits malignant range FDG uptake, suspicious for metastatic adenopathy. Subcutaneous nodule within the upper left chest wall exhibits malignant range uptake, which is indeterminate; this is at the site of previous Port-A-Cath and is favored to represent postsurgical change. Indeterminate left adrenal nodules which exhibit mild uptake, favored to represent benign adenoma.   08/11/2017 - 11/03/2017 Chemotherapy   The patient had ado-trastuzumab emtansine (KADCYLA) 320 mg in sodium chloride 0.9 % 250 mL chemo infusion, 3.6 mg/kg = 320 mg, Intravenous, Once, 4 of 5 cycles Administration: 320 mg (08/12/2017), 320 mg (09/01/2017), 320 mg (09/23/2017)  for chemotherapy treatment.    11/18/2017 - 08/30/2020 Chemotherapy   The patient had trastuzumab (HERCEPTIN) 546 mg in sodium chloride 0.9 % 250 mL chemo infusion, 6 mg/kg = 546 mg (100 % of original dose 6 mg/kg), Intravenous,  Once, 2 of 2 cycles Dose modification: 6  mg/kg (original dose 6 mg/kg, Cycle 1, Reason: Provider Judgment) Administration: 546 mg (11/18/2017), 546 mg  (12/16/2017) pertuzumab (PERJETA) 420 mg in sodium chloride 0.9 % 250 mL chemo infusion, 420 mg (100 % of original dose 420 mg), Intravenous, Once, 15 of 15 cycles Dose modification: 420 mg (original dose 420 mg, Cycle 18), 420 mg (original dose 420 mg, Cycle 27) Administration: 420 mg (10/06/2018), 420 mg (10/27/2018), 420 mg (11/18/2018), 420 mg (12/08/2018), 420 mg (12/29/2018), 420 mg (01/19/2019), 420 mg (02/16/2019), 420 mg (03/16/2019), 420 mg (04/13/2019), 420 mg (06/08/2019), 420 mg (07/06/2019), 420 mg (08/03/2019), 420 mg (08/31/2019), 420 mg (09/28/2019), 420 mg (05/19/2019) trastuzumab-anns (KANJINTI) 546 mg in sodium chloride 0.9 % 250 mL chemo infusion, 6 mg/kg = 546 mg (100 % of original dose 6 mg/kg), Intravenous,  Once, 30 of 30 cycles Dose modification: 6 mg/kg (original dose 6 mg/kg, Cycle 3, Reason: Other (see comments), Comment: requested step therapy by [facility] insurance), 4 mg/kg (original dose 6 mg/kg, Cycle 9, Reason: Provider Judgment), 6 mg/kg (original dose 6 mg/kg, Cycle 17, Reason: Provider Judgment), 4 mg/kg (original dose 6 mg/kg, Cycle 27, Reason: Provider Judgment), 6 mg/kg (original dose 6 mg/kg, Cycle 27, Reason: Provider Judgment) Administration: 546 mg (01/13/2018), 546 mg (02/10/2018), 546 mg (03/03/2018), 546 mg (03/24/2018), 546 mg (04/14/2018), 546 mg (05/05/2018), 357 mg (05/26/2018), 357 mg (06/09/2018), 357 mg (06/23/2018), 357 mg (07/07/2018), 357 mg (07/21/2018), 357 mg (08/04/2018), 357 mg (08/18/2018), 357 mg (09/01/2018), 546 mg (09/15/2018), 546 mg (10/06/2018), 546 mg (10/27/2018), 546 mg (11/18/2018), 546 mg (12/08/2018), 546 mg (12/29/2018), 546 mg (01/19/2019), 546 mg (02/16/2019), 480 mg (03/16/2019), 483 mg (04/13/2019), 450 mg (06/08/2019), 450 mg (07/06/2019), 450 mg (08/03/2019), 450 mg (08/31/2019), 450 mg (09/28/2019)  for chemotherapy treatment.    12/31/2017 - 09/01/2018 Chemotherapy   The patient had PACLitaxel-protein bound (ABRAXANE) chemo infusion 175 mg, 80 mg/m2 = 175 mg (100 % of  original dose 80 mg/m2), Intravenous,  Once, 5 of 10 cycles Dose modification: 80 mg/m2 (original dose 80 mg/m2, Cycle 11) Administration: 175 mg (07/07/2018), 175 mg (07/21/2018), 175 mg (08/04/2018), 175 mg (08/18/2018), 175 mg (09/01/2018) PACLitaxel-protein bound (ABRAXANE) chemo infusion 200 mg, 100 mg/m2 = 200 mg, Intravenous, Once, 10 of 10 cycles Dose modification: 80 mg/m2 (original dose 100 mg/m2, Cycle 2, Reason: Provider Judgment), 80 mg/m2 (original dose 100 mg/m2, Cycle 2, Reason: Provider Judgment) Administration: 200 mg (12/31/2017), 200 mg (01/06/2018), 175 mg (01/20/2018), 175 mg (01/27/2018), 175 mg (02/10/2018), 175 mg (03/03/2018), 175 mg (03/10/2018), 175 mg (03/24/2018), 175 mg (04/07/2018), 175 mg (04/14/2018), 175 mg (04/28/2018), 175 mg (05/05/2018), 175 mg (05/26/2018), 175 mg (06/09/2018), 175 mg (05/19/2018)  for chemotherapy treatment.    10/05/2020 - 01/18/2021 Chemotherapy   Patient is on Treatment Plan : BREAST METASTATIC fam-trastuzumab deruxtecan-nxki (Enhertu) q21d     02/19/2021 - 05/14/2021 Chemotherapy   Patient is on Treatment Plan : BREAST Trastuzumab q21d x 13 cycles     06/20/2021 - 08/01/2021 Chemotherapy   Patient is on Treatment Plan : BREAST METASTATIC fam-trastuzumab deruxtecan-nxki (Enhertu) q21d     07/25/2021 -  Chemotherapy   Patient is on Treatment Plan : BREAST METASTATIC Sacituzumab govitecan-hziy Ivette Loyal) D1,8 q21d     08/31/2021 - 09/21/2021 Chemotherapy   Patient is on Treatment Plan : BREAST METASTATIC Sacituzumab govitecan-hziy Ivette Loyal) q21d     Metastasis to liver ([place])  08/04/2017 Initial Diagnosis   Metastasis to liver ([place])   08/12/2017 - 10/14/2017 Chemotherapy   The patient had ado-trastuzumab emtansine (KADCYLA) 320 mg  in sodium chloride 0.9 % 250 mL chemo infusion, 3.6 mg/kg = 320 mg, Intravenous, Once, 4 of 5 cycles Administration: 320 mg (08/12/2017), 320 mg (09/01/2017), 320 mg (09/23/2017)  for chemotherapy treatment.    11/18/2017 - 08/30/2020  Chemotherapy   The patient had trastuzumab (HERCEPTIN) 546 mg in sodium chloride 0.9 % 250 mL chemo infusion, 6 mg/kg = 546 mg (100 % of original dose 6 mg/kg), Intravenous,  Once, 2 of 2 cycles Dose modification: 6 mg/kg (original dose 6 mg/kg, Cycle 1, Reason: Provider Judgment) Administration: 546 mg (11/18/2017), 546 mg (12/16/2017) pertuzumab (PERJETA) 420 mg in sodium chloride 0.9 % 250 mL chemo infusion, 420 mg (100 % of original dose 420 mg), Intravenous, Once, 15 of 15 cycles Dose modification: 420 mg (original dose 420 mg, Cycle 18), 420 mg (original dose 420 mg, Cycle 27) Administration: 420 mg (10/06/2018), 420 mg (10/27/2018), 420 mg (11/18/2018), 420 mg (12/08/2018), 420 mg (12/29/2018), 420 mg (01/19/2019), 420 mg (02/16/2019), 420 mg (03/16/2019), 420 mg (04/13/2019), 420 mg (06/08/2019), 420 mg (07/06/2019), 420 mg (08/03/2019), 420 mg (08/31/2019), 420 mg (09/28/2019), 420 mg (05/19/2019) trastuzumab-anns (KANJINTI) 546 mg in sodium chloride 0.9 % 250 mL chemo infusion, 6 mg/kg = 546 mg (100 % of original dose 6 mg/kg), Intravenous,  Once, 30 of 30 cycles Dose modification: 6 mg/kg (original dose 6 mg/kg, Cycle 3, Reason: Other (see comments), Comment: requested step therapy by [facility] insurance), 4 mg/kg (original dose 6 mg/kg, Cycle 9, Reason: Provider Judgment), 6 mg/kg (original dose 6 mg/kg, Cycle 17, Reason: Provider Judgment), 4 mg/kg (original dose 6 mg/kg, Cycle 27, Reason: Provider Judgment), 6 mg/kg (original dose 6 mg/kg, Cycle 27, Reason: Provider Judgment) Administration: 546 mg (01/13/2018), 546 mg (02/10/2018), 546 mg (03/03/2018), 546 mg (03/24/2018), 546 mg (04/14/2018), 546 mg (05/05/2018), 357 mg (05/26/2018), 357 mg (06/09/2018), 357 mg (06/23/2018), 357 mg (07/07/2018), 357 mg (07/21/2018), 357 mg (08/04/2018), 357 mg (08/18/2018), 357 mg (09/01/2018), 546 mg (09/15/2018), 546 mg (10/06/2018), 546 mg (10/27/2018), 546 mg (11/18/2018), 546 mg (12/08/2018), 546 mg (12/29/2018), 546 mg (01/19/2019), 546 mg  (02/16/2019), 480 mg (03/16/2019), 483 mg (04/13/2019), 450 mg (06/08/2019), 450 mg (07/06/2019), 450 mg (08/03/2019), 450 mg (08/31/2019), 450 mg (09/28/2019)  for chemotherapy treatment.      CHIEF COMPLIANT: Follow-up Trodelvy cycle 3  INTERVAL HISTORY: Darlene [name] is a 71 Year-old- with the above mention. She presents to the clinic today for a follow-up. She reports that she is feeling pretty good. She does have fatigue that is not relieved by rest. She has been taking the Zofran so no side effects to nausea.  Her major complaint is fatigue and it has been interfering with some of her activities.   ALLERGIES:  is allergic to antihistamines, diphenhydramine-type; nsaids; and tape.  MEDICATIONS:  Current Outpatient Medications  Medication Sig Dispense Refill   Accu-Chek Softclix Lancets lancets 1 each by Other route 4 (four) times daily. Test blood sugars 4 x times daily 200 each 1   aspirin EC 81 MG tablet Take 1 tablet (81 mg total) by mouth daily. 90 tablet 3   Bempedoic Acid-Ezetimibe (NEXLIZET) 180-10 MG TABS Take 1 tablet by mouth daily. 90 tablet 3   Blood Glucose Calibration (ACCU-CHEK GUIDE CONTROL) LIQD 1 Bottle by In Vitro route as needed. 1 each 0   Blood Glucose Monitoring Suppl (ACCU-CHEK GUIDE) w/Device KIT 1 each by Does not apply route 4 (four) times daily. Use to test blood sugars 4x a day 1 kit 0   buPROPion (  WELLBUTRIN SR) 200 MG 12 hr tablet Take one tablet by mouth once daily. 90 tablet 0   CALCIUM CITRATE-VITAMIN D PO Take 1 tablet by mouth 3 (three) times daily.      carvedilol (COREG) 6.25 MG tablet Take 1 tablet (6.25 mg total) by mouth 2 (two) times daily. 180 tablet 3   cholecalciferol (VITAMIN D) 1000 units tablet Take 1,000 Units by mouth daily.     Continuous Blood Gluc Sensor (FREESTYLE LIBRE 2 SENSOR) MISC      Cyanocobalamin (VITAMIN B-12 PO) Take 1 tablet by mouth every 14 (fourteen) days.     diphenhydrAMINE (BENADRYL) 25 MG tablet Take 25 mg by mouth  daily as needed for allergies or sleep.     glucose blood (ACCU-CHEK GUIDE) test strip Use Accu Chek test strips to check blood sugar 4 times daily. DX:E11.21 400 each 0   HYDROcodone-acetaminophen (NORCO) 5-325 MG tablet Take 1 tablet by mouth every 6 (six) hours as needed for severe pain. (Patient not taking: Reported on 10/04/2021) 10 tablet 0   insulin aspart (NOVOLOG) 100 UNIT/ML injection Inject into the skin daily as needed for high blood sugar. Per patient, sliding scale with meals as needed     insulin glargine (LANTUS SOLOSTAR) 100 UNIT/ML Solostar Pen Inject 8 Units into the skin daily. 7 mL 3   KERENDIA 20 MG TABS Take 1 tablet by mouth daily. 30 tablet 6   loratadine (CLARITIN) 10 MG tablet Take 10 mg by mouth as needed for allergies.     melatonin 5 MG TABS Take 5 mg by mouth at bedtime.     omeprazole (PRILOSEC) 40 MG capsule Take 1 capsule (40 mg total) by mouth daily. 30 capsule 1   Pediatric Multivitamins-Iron (FLINTSTONES PLUS IRON) chewable tablet Chew 1 tablet by mouth 2 (two) times daily.     polyethylene glycol (MIRALAX / GLYCOLAX) 17 g packet Take 17 g by mouth daily.     Probiotic Product ([employer] PROBIOTIC PO) Take 1 capsule by mouth daily.      prochlorperazine (COMPAZINE) 10 MG tablet Take 1 tablet (10 mg total) by mouth every 6 (six) hours as needed (Nausea or vomiting). 30 tablet 6   Propylene Glycol (SYSTANE BALANCE OP) Place 1-2 drops into both eyes 3 (three) times daily as needed (for dry eyes).      SURE COMFORT PEN NEEDLES 32G X 4 MM MISC Use as instructed to inject insulin and Victoza daily. 200 each 0   tobramycin (TOBREX) 0.3 % ophthalmic ointment Place 1 application into both eyes as needed (Takes day before eye injections).     vortioxetine HBr (TRINTELLIX) 20 MG TABS tablet Take 1 tablet (20 mg total) by mouth at bedtime. 90 tablet 0   No current facility-administered medications for this visit.   Facility-Administered Medications Ordered in Other Visits   Medication Dose Route Frequency Provider Last Rate Last Admin   acetaminophen (TYLENOL) 325 MG tablet            diphenhydrAMINE (BENADRYL) 50 MG/ML injection            famotidine (PEPCID) 20-0.9 MG/50ML-% IVPB            palonosetron (ALOXI) 0.25 MG/5ML injection             PHYSICAL EXAMINATION: ECOG PERFORMANCE STATUS: 1 - Symptomatic but completely ambulatory  Vitals:   10/12/21 1045  BP: (!) 150/62  Pulse: 67  Resp: 18  Temp: 97.8 F (36.6 C)  SpO2:  98%   Filed Weights   10/12/21 1045  Weight: 181 lb 11.2 oz (82.4 kg)      LABORATORY DATA:  I have reviewed the data as listed    Latest Ref Rng & Units 09/27/2021   10:51 AM 09/21/2021   11:33 AM 09/07/2021   10:46 AM  CMP  Glucose 70 - 99 mg/dL 127  248  175   BUN 8 - 23 mg/dL 42  34  46   Creatinine 0.44 - 1.00 mg/dL 1.77  1.62  1.80   Sodium 135 - 145 mmol/L 141  140  141   Potassium 3.5 - 5.1 mmol/L 4.7  4.2  4.5   Chloride 98 - 111 mmol/L 108  108  107   CO2 22 - 32 mmol/L '28  28  30   ' Calcium 8.9 - 10.3 mg/dL 9.3  8.7  9.3   Total Protein 6.5 - 8.1 g/dL 6.3  6.0  6.0   Total Bilirubin 0.3 - 1.2 mg/dL 0.4  0.3  0.3   Alkaline Phos 38 - 126 U/L 61  65  68   AST 15 - 41 U/L '23  26  28   ' ALT 0 - 44 U/L '13  13  14     ' Lab Results  Component Value Date   WBC 3.3 (L) 10/12/2021   HGB 9.5 (L) 10/12/2021   HCT 27.7 (L) 10/12/2021   MCV 100.0 10/12/2021   PLT 190 10/12/2021   NEUTROABS 2.1 10/12/2021    ASSESSMENT & PLAN:  Breast cancer of upper-outer quadrant of right female breast ([place]) 07/19/2014: Right breast cancer grade 2 IDC ER/PR negative HER2 positive Ki-67 90% treated with neoadjuvant chemo with TCHP x6 cycles followed by Herceptin maintenance, right lumpectomy had residual disease 4 cm 1/3 lymph node positive, ER/PR negative, adjuvant radiation and capecitabine completed 09/27/2015 12/06/2015: Recurrence: Right subpectoral lymph node and subcutaneous nodule, liver mass (biopsy adenocarcinoma consistent  with upper GI or pancreaticobiliary primary, HER2 negative, ER negative) July 2019: Metastatic disease: TDM 1 followed by Herceptin maintenance, pertuzumab added 10/06/2018, Abraxane 12/31/2017-09/01/2018, Herceptin and pertuzumab maintenance   Prior treatment: Enhertu started 10/05/2020 switched to Herceptin maintenance 02/19/2021 switched to Enhertu at higher dosage x 3 cycles discontinued 08/01/2021    02/04/2021: PET CT scan: Complete metabolic response, no evidence of hypermetabolic disease in the liver (mass measured 2.7 cm previously is now 1.5 cm with no uptake)    08/20/2021: CT CAP: Increased size of the metastatic liver lesions went from 6 cm to 7.5 cm and hepatic dome lesion is now 17 mm previously 10 mm, stable size of the right subpectoral lymph node, no new sites of metastatic disease, stable upper abdominal lymph nodes probably benign  ------------------------------------------------------------------------------------------------------------------------------------------- Current treatment: Ivette Loyal cycle 3 day 1 Liver biopsy was performed 08/30/2021: Poorly differentiated carcinoma consistent with metastatic breast cancer.  ER negative, PR negative, GCDFP negative, HER2 1+ Molecular testing: NTRK 1: Amplified, genomic LOH: High, TMB: 4 MSI stable (PIK 3 CA: Not detected, BRCA not detected)   Toxicities: 1.  Mild nausea 2. severe fatigue 3.  Chemo-induced anemia: Monitoring, hemoglobin 9.5 4.  Mild leukopenia: WBC 3.3 5.  Alopecia We may reduce the dosage of chemotherapy based upon the results of the CT scans. It is concerning that the CEA level is not coming down. If there is progression of disease then we may have to consider changing treatment. Clinical trial versus single agent chemotherapy with carboplatin or gemcitabine or Navelbine or Xeloda I  will see the patient on cycle 1 of each treatment. After this cycle we will plan to obtain scans.    Orders Placed This Encounter   Procedures   CT CHEST ABDOMEN PELVIS W CONTRAST    Standing Status:   Future    Standing Expiration Date:   10/13/2022    Order Specific Question:   Preferred imaging location?    Answer:   [facility]    Order Specific Question:   Is Oral Contrast requested for this exam?    Answer:   Yes, Per Radiology protocol   The patient has a good understanding of the overall plan. she agrees with it. she will call with any problems that may develop before the next visit here. Total time spent: 30 mins including face to face time and time spent for planning, charting and co-ordination of care   Harriette Ohara, MD 10/12/21    I Gardiner Coins am scribing for Dr. Lindi Adie  I have reviewed the above documentation for accuracy and completeness, and I agree with the above.

## 2021-10-12 ENCOUNTER — Other Ambulatory Visit: Payer: Self-pay

## 2021-10-12 ENCOUNTER — Encounter: Payer: Self-pay | Admitting: Hematology and Oncology

## 2021-10-12 ENCOUNTER — Inpatient Hospital Stay (HOSPITAL_BASED_OUTPATIENT_CLINIC_OR_DEPARTMENT_OTHER): Payer: Medicare PPO | Admitting: Hematology and Oncology

## 2021-10-12 ENCOUNTER — Inpatient Hospital Stay: Payer: Medicare PPO

## 2021-10-12 VITALS — BP 146/58 | HR 53 | Temp 97.8°F | Resp 18

## 2021-10-12 VITALS — BP 150/62 | HR 67 | Temp 97.8°F | Resp 18 | Ht 69.0 in | Wt 181.7 lb

## 2021-10-12 DIAGNOSIS — Z452 Encounter for adjustment and management of vascular access device: Secondary | ICD-10-CM | POA: Diagnosis not present

## 2021-10-12 DIAGNOSIS — C50811 Malignant neoplasm of overlapping sites of right female breast: Secondary | ICD-10-CM | POA: Diagnosis not present

## 2021-10-12 DIAGNOSIS — Z5112 Encounter for antineoplastic immunotherapy: Secondary | ICD-10-CM | POA: Diagnosis not present

## 2021-10-12 DIAGNOSIS — C50411 Malignant neoplasm of upper-outer quadrant of right female breast: Secondary | ICD-10-CM | POA: Diagnosis not present

## 2021-10-12 DIAGNOSIS — D6481 Anemia due to antineoplastic chemotherapy: Secondary | ICD-10-CM | POA: Diagnosis not present

## 2021-10-12 DIAGNOSIS — D701 Agranulocytosis secondary to cancer chemotherapy: Secondary | ICD-10-CM | POA: Diagnosis not present

## 2021-10-12 DIAGNOSIS — C787 Secondary malignant neoplasm of liver and intrahepatic bile duct: Secondary | ICD-10-CM | POA: Diagnosis not present

## 2021-10-12 DIAGNOSIS — Z171 Estrogen receptor negative status [ER-]: Secondary | ICD-10-CM | POA: Diagnosis not present

## 2021-10-12 DIAGNOSIS — Z95828 Presence of other vascular implants and grafts: Secondary | ICD-10-CM

## 2021-10-12 LAB — CMP (CANCER CENTER ONLY)
ALT: 12 U/L (ref 0–44)
AST: 22 U/L (ref 15–41)
Albumin: 3.7 g/dL (ref 3.5–5.0)
Alkaline Phosphatase: 54 U/L (ref 38–126)
Anion gap: 4 — ABNORMAL LOW (ref 5–15)
BUN: 32 mg/dL — ABNORMAL HIGH (ref 8–23)
CO2: 27 mmol/L (ref 22–32)
Calcium: 8.7 mg/dL — ABNORMAL LOW (ref 8.9–10.3)
Chloride: 111 mmol/L (ref 98–111)
Creatinine: 1.55 mg/dL — ABNORMAL HIGH (ref 0.44–1.00)
GFR, Estimated: 36 mL/min — ABNORMAL LOW (ref >60)
Glucose, Bld: 127 mg/dL — ABNORMAL HIGH (ref 70–99)
Potassium: 4.3 mmol/L (ref 3.5–5.1)
Sodium: 142 mmol/L (ref 135–145)
Total Bilirubin: 0.3 mg/dL (ref 0.3–1.2)
Total Protein: 5.8 g/dL — ABNORMAL LOW (ref 6.5–8.1)

## 2021-10-12 LAB — CBC WITH DIFFERENTIAL (CANCER CENTER ONLY)
Abs Immature Granulocytes: 0.01 10*3/uL (ref 0.00–0.07)
Basophils Absolute: 0 10*3/uL (ref 0.0–0.1)
Basophils Relative: 1 %
Eosinophils Absolute: 0.1 10*3/uL (ref 0.0–0.5)
Eosinophils Relative: 4 %
HCT: 27.7 % — ABNORMAL LOW (ref 36.0–46.0)
Hemoglobin: 9.5 g/dL — ABNORMAL LOW (ref 12.0–15.0)
Immature Granulocytes: 0 %
Lymphocytes Relative: 21 %
Lymphs Abs: 0.7 10*3/uL (ref 0.7–4.0)
MCH: 34.3 pg — ABNORMAL HIGH (ref 26.0–34.0)
MCHC: 34.3 g/dL (ref 30.0–36.0)
MCV: 100 fL (ref 80.0–100.0)
Monocytes Absolute: 0.4 10*3/uL (ref 0.1–1.0)
Monocytes Relative: 12 %
Neutro Abs: 2.1 10*3/uL (ref 1.7–7.7)
Neutrophils Relative %: 62 %
Platelet Count: 190 10*3/uL (ref 150–400)
RBC: 2.77 MIL/uL — ABNORMAL LOW (ref 3.87–5.11)
RDW: 13.7 % (ref 11.5–15.5)
WBC Count: 3.3 10*3/uL — ABNORMAL LOW (ref 4.0–10.5)
nRBC: 0 % (ref 0.0–0.2)

## 2021-10-12 LAB — PHOSPHORUS: Phosphorus: 4.3 mg/dL (ref 2.5–4.6)

## 2021-10-12 LAB — CEA (IN HOUSE-CHCC): CEA (CHCC-In House): 721.27 ng/mL — ABNORMAL HIGH (ref 0.00–5.00)

## 2021-10-12 LAB — MAGNESIUM: Magnesium: 2 mg/dL (ref 1.7–2.4)

## 2021-10-12 MED ORDER — SODIUM CHLORIDE 0.9% FLUSH
10.0000 mL | Freq: Once | INTRAVENOUS | Status: AC
  Administered 2021-10-12: 10 mL

## 2021-10-12 MED ORDER — ACETAMINOPHEN 325 MG PO TABS
650.0000 mg | ORAL_TABLET | Freq: Once | ORAL | Status: AC
  Administered 2021-10-12: 650 mg via ORAL
  Filled 2021-10-12: qty 2

## 2021-10-12 MED ORDER — FAMOTIDINE IN NACL 20-0.9 MG/50ML-% IV SOLN
20.0000 mg | Freq: Once | INTRAVENOUS | Status: AC
  Administered 2021-10-12: 20 mg via INTRAVENOUS
  Filled 2021-10-12: qty 50

## 2021-10-12 MED ORDER — CETIRIZINE HCL 10 MG/ML IV SOLN
10.0000 mg | Freq: Once | INTRAVENOUS | Status: AC
  Administered 2021-10-12: 10 mg via INTRAVENOUS
  Filled 2021-10-12: qty 1

## 2021-10-12 MED ORDER — SODIUM CHLORIDE 0.9 % IV SOLN
7.5000 mg/kg | Freq: Once | INTRAVENOUS | Status: AC
  Administered 2021-10-12: 600 mg via INTRAVENOUS
  Filled 2021-10-12: qty 60

## 2021-10-12 MED ORDER — SODIUM CHLORIDE 0.9 % IV SOLN
150.0000 mg | Freq: Once | INTRAVENOUS | Status: AC
  Administered 2021-10-12: 150 mg via INTRAVENOUS
  Filled 2021-10-12: qty 150

## 2021-10-12 MED ORDER — PALONOSETRON HCL INJECTION 0.25 MG/5ML
0.2500 mg | Freq: Once | INTRAVENOUS | Status: AC
  Administered 2021-10-12: 0.25 mg via INTRAVENOUS
  Filled 2021-10-12: qty 5

## 2021-10-12 MED ORDER — SODIUM CHLORIDE 0.9% FLUSH
10.0000 mL | INTRAVENOUS | Status: DC | PRN
  Administered 2021-10-12: 10 mL

## 2021-10-12 MED ORDER — SODIUM CHLORIDE 0.9 % IV SOLN
10.0000 mg | Freq: Once | INTRAVENOUS | Status: AC
  Administered 2021-10-12: 10 mg via INTRAVENOUS
  Filled 2021-10-12: qty 10

## 2021-10-12 MED ORDER — SODIUM CHLORIDE 0.9 % IV SOLN: Freq: Once | INTRAVENOUS | Status: AC

## 2021-10-12 MED ORDER — HEPARIN SOD (PORK) LOCK FLUSH 100 UNIT/ML IV SOLN
500.0000 [IU] | Freq: Once | INTRAVENOUS | Status: AC | PRN
  Administered 2021-10-12: 500 [IU]

## 2021-10-12 MED ORDER — PROCHLORPERAZINE MALEATE 10 MG PO TABS: 10.0000 mg | ORAL_TABLET | Freq: Four times a day (QID) | ORAL | 6 refills | Status: DC | PRN

## 2021-10-12 NOTE — Assessment & Plan Note (Addendum)
07/19/2014: Right breast cancer grade 2 IDC ER/PR negative HER2 positive Ki-67 90% treated with neoadjuvant chemo with TCHP x6 cycles followed by Herceptin maintenance, right lumpectomy had residual disease 4 cm 1/3 lymph node positive, ER/PR negative, adjuvant radiation and capecitabine completed 09/27/2015 12/06/2015: Recurrence: Right subpectoral lymph node and subcutaneous nodule, liver mass (biopsy adenocarcinoma consistent with upper GI or pancreaticobiliary primary, HER2 negative, ER negative) July 2019: Metastatic disease: TDM 1 followed by Herceptin maintenance,pertuzumabadded 10/06/2018, Abraxane 12/31/2017-09/01/2018, Herceptin and pertuzumab maintenance  Priortreatment: Enhertu started 9/15/2022switched to Herceptin maintenance 1/30/2023switched to Enhertu at higher dosage x 3 cycles discontinued 08/01/2021   02/04/2021: PET CT scan: Complete metabolic response, no evidence of hypermetabolic disease in the liver (mass measured 2.7 cm previously is now 1.5 cm with no uptake)  08/20/2021: CT CAP: Increased size of the metastatic liver lesions went from 6 cm to 7.5 cm and hepatic dome lesion is now 17 mm previously 10 mm, stable size of the right subpectoral lymph node, no new sites of metastatic disease, stable upper abdominal lymph nodes probably benign ------------------------------------------------------------------------------------------------------------------------------------------- Current treatment: Ivette Loyal cycle 3 day 1 Liver biopsy was performed 08/30/2021: Poorly differentiated carcinoma consistent with metastatic breast cancer. ER negative, PR negative, GCDFP negative, HER2 1+ Molecular testing: NTRK 1: Amplified, genomic LOH: High, TMB: 4 MSI stable (PIK 3 CA: Not detected, BRCA not detected)  Toxicities: 1.Mild nausea 2.mild fatigue 3.Chemo-induced anemia: Monitoring, hemoglobin 9. 4.Mild leukopenia: WBC 2.9 5.  Alopecia  I will see the patient on cycle 1  of each treatment. After this cycle we will plan to obtain scans.

## 2021-10-12 NOTE — Patient Instructions (Signed)
[facility]  Discharge Instructions: Thank you for choosing [facility] to provide your oncology and hematology care.   If you have a lab appointment with the [facility], please go directly to the [facility] and check in at the registration area.   Wear comfortable clothing and clothing appropriate for easy access to any Portacath or PICC line.   We strive to give you quality time with your provider. You may need to reschedule your appointment if you arrive late (15 or more minutes).  Arriving late affects you and other patients whose appointments are after yours.  Also, if you miss three or more appointments without notifying the office, you may be dismissed from the clinic at the provider's discretion.      For prescription refill requests, have your pharmacy contact our office and allow 72 hours for refills to be completed.    Today you received the following chemotherapy and/or immunotherapy agents: sacituzumab govitecan Ivette Loyal)      To help prevent nausea and vomiting after your treatment, we encourage you to take your nausea medication as directed.  BELOW ARE SYMPTOMS THAT SHOULD BE REPORTED IMMEDIATELY: *FEVER GREATER THAN 100.4 F (38 C) OR HIGHER *CHILLS OR SWEATING *NAUSEA AND VOMITING THAT IS NOT CONTROLLED WITH YOUR NAUSEA MEDICATION *UNUSUAL SHORTNESS OF BREATH *UNUSUAL BRUISING OR BLEEDING *URINARY PROBLEMS (pain or burning when urinating, or frequent urination) *BOWEL PROBLEMS (unusual diarrhea, constipation, pain near the anus) TENDERNESS IN MOUTH AND THROAT WITH OR WITHOUT PRESENCE OF ULCERS (sore throat, sores in mouth, or a toothache) UNUSUAL RASH, SWELLING OR PAIN  UNUSUAL VAGINAL DISCHARGE OR ITCHING   Items with * indicate a potential emergency and should be followed up as soon as possible or go to the Emergency Department if any problems should occur.  Please show the CHEMOTHERAPY ALERT CARD or IMMUNOTHERAPY  ALERT CARD at check-in to the Emergency Department and triage nurse.  Should you have questions after your visit or need to cancel or reschedule your appointment, please contact [facility]  Dept: [phone]  and follow the prompts.  Office hours are 8:00 a.m. to 4:30 p.m. Monday - Friday. Please note that voicemails left after 4:00 p.m. may not be returned until the following business day.  We are closed weekends and major holidays. You have access to a nurse at all times for urgent questions. Please call the main number to the clinic Dept: [phone] and follow the prompts.   For any non-urgent questions, you may also contact your provider using MyChart. We now offer e-Visits for anyone 67 and older to request care online for non-urgent symptoms. For details visit [email or web address].   Also download the MyChart app! Go to the app store, search "MyChart", open the app, select [facility], and log in with your MyChart username and password.  Masks are optional in the cancer centers. If you would like for your care team to wear a mask while they are taking care of you, please let them know. You may have one support person who is at least 69 years old accompany you for your appointments.

## 2021-10-12 NOTE — Progress Notes (Signed)
Pt decline 30 post trodelvy observation. VSS at discharge

## 2021-10-16 ENCOUNTER — Encounter: Payer: Self-pay | Admitting: Obstetrics and Gynecology

## 2021-10-16 ENCOUNTER — Ambulatory Visit (INDEPENDENT_AMBULATORY_CARE_PROVIDER_SITE_OTHER): Payer: Medicare PPO | Admitting: Obstetrics and Gynecology

## 2021-10-16 ENCOUNTER — Ambulatory Visit (INDEPENDENT_AMBULATORY_CARE_PROVIDER_SITE_OTHER): Payer: Medicare PPO

## 2021-10-16 DIAGNOSIS — N950 Postmenopausal bleeding: Secondary | ICD-10-CM

## 2021-10-16 DIAGNOSIS — R269 Unspecified abnormalities of gait and mobility: Secondary | ICD-10-CM | POA: Diagnosis not present

## 2021-10-16 DIAGNOSIS — F339 Major depressive disorder, recurrent, unspecified: Secondary | ICD-10-CM | POA: Diagnosis not present

## 2021-10-16 DIAGNOSIS — Z9181 History of falling: Secondary | ICD-10-CM | POA: Diagnosis not present

## 2021-10-16 DIAGNOSIS — M25572 Pain in left ankle and joints of left foot: Secondary | ICD-10-CM | POA: Diagnosis not present

## 2021-10-16 DIAGNOSIS — I10 Essential (primary) hypertension: Secondary | ICD-10-CM | POA: Diagnosis not present

## 2021-10-16 DIAGNOSIS — M25671 Stiffness of right ankle, not elsewhere classified: Secondary | ICD-10-CM | POA: Diagnosis not present

## 2021-10-16 DIAGNOSIS — E118 Type 2 diabetes mellitus with unspecified complications: Secondary | ICD-10-CM | POA: Diagnosis not present

## 2021-10-16 DIAGNOSIS — M6259 Muscle wasting and atrophy, not elsewhere classified, multiple sites: Secondary | ICD-10-CM | POA: Diagnosis not present

## 2021-10-16 NOTE — Progress Notes (Signed)
GYNECOLOGY  VISIT   HPI: [id number] y.o.   Married White or Caucasian Not Hispanic or Latino  female   [id number] with Patient's last menstrual period was 10/22/2007.   here for evaluation of PMP spotting. No spotting for about a month.     GYNECOLOGIC HISTORY: Patient's last menstrual period was 10/22/2007.        OB History     Gravida  1   Para  0   Term  0   Preterm  0   AB  1   Living  0      SAB  0   IAB  0   Ectopic  0   Multiple  0   Live Births  0              Patient Active Problem List   Diagnosis Date Noted   Hereditary and idiopathic neuropathy, unspecified 09/20/2021   Proteinuria 09/20/2021   Constipation due to outlet dysfunction    Stress 07/31/2021   Abnormal findings on diagnostic imaging of other parts of digestive tract 02/09/2021   Allergic rhinitis 02/09/2021   Arthritis of right knee 02/09/2021   Diverticular disease of colon 02/09/2021   Gallstones 02/09/2021   Hyperglycemia due to type 2 diabetes mellitus ([place]) 02/09/2021   Neuropathy 02/09/2021   Pure hypercholesterolemia 02/09/2021   Shoulder joint pain 02/09/2021   Thyroid nodule 02/09/2021   Vitamin B12 deficiency 02/09/2021   CKD (chronic kidney disease) stage 4, GFR 15-29 ml/min (HCC) 09/27/2020   Hammer toe of second toe of right foot 11/16/2018   Hallux valgus of right foot 11/16/2018   Essential hypertension 11/04/2018   Pain due to onychomycosis of toenails of both feet 10/14/2018   Pre-ulcerative calluses 10/14/2018   Diabetic retinal edema ([place]) 07/19/2018   Diabetic retinopathy of both eyes associated with type 2 diabetes mellitus ([place]) 06/28/2018   Goals of care, counseling/discussion 12/30/2017   Occult malignancy ([place]) 12/01/2017   Neoplastic malignant related fatigue 09/29/2017   Metastasis to liver ([place]) 08/04/2017   Lesion of liver 07/30/2017   Type 2 diabetes mellitus without complication, with long-term current use of insulin ([place]) 03/11/2017   Vitamin D  deficiency 02/24/2017   Aortic atherosclerosis ([name]) 01/28/2017   Type 2 diabetes mellitus without complication, without long-term current use of insulin ([place]) 12/18/2016   Class 1 obesity with serious comorbidity and body mass index (BMI) of 30.0 to 30.9 in adult 12/18/2016   Port catheter in place 08/21/2015   Acute appendicitis 07/22/2015   Appendicitis with peritonitis 07/22/2015   Acquired absence of right breast and nipple 07/05/2015   History of therapeutic radiation 07/05/2015   Recurrent breast cancer, right ([place]) 02/15/2015   Chemotherapy-induced peripheral neuropathy ([place]) 02/08/2015   Macromastia 01/06/2015   Anemia in neoplastic disease 12/21/2014   Cough    Breast cancer of upper-outer quadrant of right female breast ([place]) 07/22/2014   OSA on CPAP 06/02/2013   Bariatric surgery status 06/02/2013   Type 2 diabetes mellitus with diabetic neuropathy, with long-term current use of insulin ([place]) 06/02/2013   Lap [place] Y Gastric Bypass Oct 2014 11/03/2012   Depression 09/10/2012   hyperlipidemia 09/10/2012    Past Medical History:  Diagnosis Date   Anemia    hx of - during 1st round chemo   Anxiety    Arthritis    in fingers, shoulders   Back pain    Balance problem    Breast cancer ([place])    Breast cancer  of upper-outer quadrant of right female breast ([place]) 07/22/2014   Bunion, right foot    CHF (congestive heart failure) ([place])    Chronic kidney disease    creatininei levels high per pt    Clumsiness    Constipation    Depression    Diabetes mellitus without complication ([place])    85+ years type 2    Diabetic retinopathy ([place])    torn retina   Eating disorder    binge eating   Epistaxis 08/26/2014   Fatty liver    Foot drop, left foot    HCAP (healthcare-associated pneumonia) 12/18/2014   Heart murmur    never had any problems   History of radiation therapy 04/05/15-05/19/15   right chest wall was treated to 50.4 Gy in 28 fractions, right mastectomy scar was  treated to 10 Gy in 5 fractions   Hyperlipidemia    Hypertension    Joint pain    Multiple food allergies    Neuromuscular disorder ([place])    diabetic neuropathy   Obesity    Obstructive sleep apnea on CPAP    does not use cpap all the time has lost 160 lbs    Ovarian cyst rupture    possible   Pneumonia    Pneumonia 11/2014   Radiation 04/05/15-05/19/15   right chest wall 50.4 Gy, mastectomy scar 10 Gy   Sleep apnea    Thyroid nodule 06/2014    Past Surgical History:  Procedure Laterality Date   ANAL RECTAL MANOMETRY N/A 08/01/2021   Procedure: ANO RECTAL MANOMETRY;  Surgeon: Thornton Park, MD;  Location: [facility];  Service: Gastroenterology;  Laterality: N/A;   APPENDECTOMY     BIOPSY THYROID Bilateral 06/2014   2 nodules - Benign    BREAST LUMPECTOMY WITH RADIOACTIVE SEED AND SENTINEL LYMPH NODE BIOPSY Right 12/27/2014   Procedure: BREAST LUMPECTOMY WITH RADIOACTIVE SEED AND SENTINEL LYMPH NODE BIOPSY;  Surgeon: Stark Klein, MD;  Location: [place] [facility];  Service: General;  Laterality: Right;   BREAST REDUCTION SURGERY Bilateral 01/06/2015   Procedure: BILATERAL BREAST REDUCTION, RIGHT ONCOPLASTIC RECONSTRUCTION),LEFT BREAST REDUCTION FOR SYMMETRY;  Surgeon: [name], MD;  Location: [facility];  Service: Plastics;  Laterality: Bilateral;   BREATH TEK H PYLORI N/A 09/15/2012   Procedure: BREATH TEK H PYLORI;  Surgeon: Pedro Earls, MD;  Location: [facility];  Service: General;  Laterality: N/A;   BUNIONECTOMY Left 12/2013   CARPAL TUNNEL RELEASE Right    CARPAL TUNNEL RELEASE Left    CATARACT EXTRACTION     6/19   COLONOSCOPY     DUPUYTREN CONTRACTURE RELEASE     GASTRIC ROUX-EN-Y N/A 11/02/2012   Procedure: LAPAROSCOPIC ROUX-EN-Y GASTRIC;  Surgeon: Pedro Earls, MD;  Location: [facility];  Service: General;  Laterality: N/A;   IR GENERIC HISTORICAL  03/18/2016   IR US GUIDE VASC ACCESS LEFT 03/18/2016 Markus Daft, MD [facility]   IR GENERIC  HISTORICAL  03/18/2016   IR FLUORO GUIDE CV LINE LEFT 03/18/2016 Markus Daft, MD [facility]   LAPAROSCOPIC APPENDECTOMY N/A 07/22/2015   Procedure: APPENDECTOMY LAPAROSCOPIC;  Surgeon: Michael Boston, MD;  Location: [facility];  Service: General;  Laterality: N/A;   MASTECTOMY Right 02/15/2015   modified   MODIFIED MASTECTOMY Right 02/15/2015   Procedure: RIGHT MODIFIED RADICAL MASTECTOMY;  Surgeon: Stark Klein, MD;  Location: [place];  Service: General;  Laterality: Right;   ORIF WRIST FRACTURE Left 05/11/2019   Procedure: OPEN REDUCTION INTERNAL FIXATION (ORIF) left  distal radius fracture;  Surgeon: Renette Butters, MD;  Location: [facility];  Service: Orthopedics;  Laterality: Left;  with block   PORT-A-CATH REMOVAL Left 10/10/2015   Procedure: PORT REMOVAL;  Surgeon: Stark Klein, MD;  Location: [place];  Service: General;  Laterality: Left;   PORTACATH PLACEMENT Left 08/02/2014   Procedure: INSERTION PORT-A-CATH;  Surgeon: Stark Klein, MD;  Location: [facility];  Service: General;  Laterality: Left;   PORTACATH PLACEMENT N/A 11/05/2016   Procedure: INSERTION PORT-A-CATH;  Surgeon: Stark Klein, MD;  Location: [place];  Service: General;  Laterality: N/A;   RETINAL LASER PROCEDURE     retina tear on left eye   ROTATOR CUFF REPAIR Right 2005   SCAR REVISION Right 12/08/2015   Procedure: COMPLEX REPAIR RIGHT CHEST 10-15CM;  Surgeon: [name], MD;  Location: [facility];  Service: Plastics;  Laterality: Right;   TOE SURGERY  1970s   bone spur   TRIGGER FINGER RELEASE     x3    Current Outpatient Medications  Medication Sig Dispense Refill   Accu-Chek Softclix Lancets lancets 1 each by Other route 4 (four) times daily. Test blood sugars 4 x times daily 200 each 1   aspirin EC 81 MG tablet Take 1 tablet (81 mg total) by mouth daily. 90 tablet 3   Bempedoic Acid-Ezetimibe (NEXLIZET) 180-10 MG TABS Take 1 tablet by mouth daily. 90 tablet 3   Blood Glucose Calibration  (ACCU-CHEK GUIDE CONTROL) LIQD 1 Bottle by In Vitro route as needed. 1 each 0   Blood Glucose Monitoring Suppl (ACCU-CHEK GUIDE) w/Device KIT 1 each by Does not apply route 4 (four) times daily. Use to test blood sugars 4x a day 1 kit 0   buPROPion (WELLBUTRIN SR) 200 MG 12 hr tablet Take one tablet by mouth once daily. 90 tablet 0   CALCIUM CITRATE-VITAMIN D PO Take 1 tablet by mouth 3 (three) times daily.      carvedilol (COREG) 6.25 MG tablet Take 1 tablet (6.25 mg total) by mouth 2 (two) times daily. 180 tablet 3   cholecalciferol (VITAMIN D) 1000 units tablet Take 1,000 Units by mouth daily.     Continuous Blood Gluc Sensor (FREESTYLE LIBRE 2 SENSOR) MISC      Cyanocobalamin (VITAMIN B-12 PO) Take 1 tablet by mouth every 14 (fourteen) days.     diphenhydrAMINE (BENADRYL) 25 MG tablet Take 25 mg by mouth daily as needed for allergies or sleep.     glucose blood (ACCU-CHEK GUIDE) test strip Use Accu Chek test strips to check blood sugar 4 times daily. DX:E11.21 400 each 0   HYDROcodone-acetaminophen (NORCO) 5-325 MG tablet Take 1 tablet by mouth every 6 (six) hours as needed for severe pain. (Patient not taking: Reported on 10/04/2021) 10 tablet 0   insulin aspart (NOVOLOG) 100 UNIT/ML injection Inject into the skin daily as needed for high blood sugar. Per patient, sliding scale with meals as needed     insulin glargine (LANTUS SOLOSTAR) 100 UNIT/ML Solostar Pen Inject 8 Units into the skin daily. 7 mL 3   KERENDIA 20 MG TABS Take 1 tablet by mouth daily. 30 tablet 6   loratadine (CLARITIN) 10 MG tablet Take 10 mg by mouth as needed for allergies.     melatonin 5 MG TABS Take 5 mg by mouth at bedtime.     omeprazole (PRILOSEC) 40 MG capsule Take 1 capsule (40 mg total) by mouth daily. 30 capsule 1   Pediatric  Multivitamins-Iron (FLINTSTONES PLUS IRON) chewable tablet Chew 1 tablet by mouth 2 (two) times daily.     polyethylene glycol (MIRALAX / GLYCOLAX) 17 g packet Take 17 g by mouth daily.      Probiotic Product ([employer] PROBIOTIC PO) Take 1 capsule by mouth daily.      prochlorperazine (COMPAZINE) 10 MG tablet Take 1 tablet (10 mg total) by mouth every 6 (six) hours as needed (Nausea or vomiting). 30 tablet 6   Propylene Glycol (SYSTANE BALANCE OP) Place 1-2 drops into both eyes 3 (three) times daily as needed (for dry eyes).      SURE COMFORT PEN NEEDLES 32G X 4 MM MISC Use as instructed to inject insulin and Victoza daily. 200 each 0   tobramycin (TOBREX) 0.3 % ophthalmic ointment Place 1 application into both eyes as needed (Takes day before eye injections).     vortioxetine HBr (TRINTELLIX) 20 MG TABS tablet Take 1 tablet (20 mg total) by mouth at bedtime. 90 tablet 0   No current facility-administered medications for this visit.   Facility-Administered Medications Ordered in Other Visits  Medication Dose Route Frequency Provider Last Rate Last Admin   acetaminophen (TYLENOL) 325 MG tablet            diphenhydrAMINE (BENADRYL) 50 MG/ML injection            famotidine (PEPCID) 20-0.9 MG/50ML-% IVPB            palonosetron (ALOXI) 0.25 MG/5ML injection              ALLERGIES: Antihistamines, diphenhydramine-type; Nsaids; and Tape  Family History  Problem Relation Age of Onset   CVA Mother    Heart disease Mother    Sudden death Mother    Diabetes Father    Heart failure Father    Hypertension Father    Kidney disease Father    Obesity Father    Hypertension Sister    Diabetes Brother    Breast cancer Paternal Grandmother 83   Diabetes Paternal Uncle    Ovarian cancer Neg Hx     Social History   Socioeconomic History   Marital status: Married    Spouse name: [name]   Number of children: 0   Years of education: Bachelor's   Highest education level: Bachelor's degree (e.g., BA, AB, BS)  Occupational History   Occupation: Pensions consultant: [employer]  Tobacco Use   Smoking status: Never   Smokeless tobacco: Never  Vaping Use   Vaping  Use: Never used  Substance and Sexual Activity   Alcohol use: Yes    Alcohol/week: 1.0 - 2.0 standard drink of alcohol    Types: 1 - 2 Standard drinks or equivalent per week    Comment: social   Drug use: No   Sexual activity: Yes    Partners: Male    Birth control/protection: Post-menopausal, Other-see comments    Comment: husband vasectomy  Other Topics Concern   Not on file  Social History Narrative   Patient is married Health and safety inspector) and lives at home with her husband.   Patient does not have any children.   Patient is working for a [employer].   Patient has a Bachelor's degree.   Patient is right-handed.   Patient does not drink any caffeine.   Social Determinants of Health   Financial Resource Strain: Low Risk  (05/19/2021)   Overall Financial Resource Strain (CARDIA)    Difficulty of Paying Living Expenses: Not hard  at all  Food Insecurity: No Food Insecurity (05/19/2021)   Hunger Vital Sign    Worried About Running Out of Food in the Last Year: Never true    Ran Out of Food in the Last Year: Never true  Transportation Needs: No Transportation Needs (05/19/2021)   PRAPARE - Hydrologist (Medical): No    Lack of Transportation (Non-Medical): No  Physical Activity: Unknown (05/19/2021)   Exercise Vital Sign    Days of Exercise per Week: 1 day    Minutes of Exercise per Session: Not on file  Stress: No Stress Concern Present (05/19/2021)   [place]    Feeling of Stress : Not at all  Social Connections: [place] (05/19/2021)   Social Connection and Isolation Panel [NHANES]    Frequency of Communication with Friends and Family: Three times a week    Frequency of Social Gatherings with Friends and Family: Twice a week    Attends Religious Services: 1 to 4 times per year    Active Member of [employer] or Organizations: Yes    Attends Music therapist: More than  4 times per year    Marital Status: Married  Human resources officer Violence: Not on file    ROS  PHYSICAL EXAMINATION:    LMP 10/22/2007     General appearance: alert, cooperative and appears stated age  U/S with thin stripe, no concerning findings.  1. Postmenopausal bleeding Call with recurrent bleeding.

## 2021-10-18 MED FILL — Fosaprepitant Dimeglumine For IV Infusion 150 MG (Base Eq): INTRAVENOUS | Qty: 5 | Status: AC

## 2021-10-18 MED FILL — Dexamethasone Sodium Phosphate Inj 100 MG/10ML: INTRAMUSCULAR | Qty: 1 | Status: AC

## 2021-10-18 NOTE — Progress Notes (Signed)
Patient Care Team: Isaac Bliss, Rayford Halsted, MD as PCP - General (Internal Medicine) Larey Dresser, MD as PCP - Advanced Heart Failure (Cardiology) Stark Klein, MD as Consulting Physician (General Surgery) Jacelyn Pi, MD as Consulting Physician (Endocrinology) [name], MD as Consulting Physician (Plastic Surgery) Gery Pray, MD as Consulting Physician (Radiation Oncology) Juanita Craver, MD as Consulting Physician (Gastroenterology) Alda Berthold, DO as Consulting Physician (Neurology) Starlyn Skeans, MD as Consulting Physician (Family Medicine) Sherlynn Stalls, MD as Consulting Physician (Ophthalmology) Salvadore Dom, MD as Consulting Physician (Obstetrics and Gynecology) Raina Mina, RPH-CPP (Pharmacist) Nicholas Lose, MD as Consulting Physician (Hematology and Oncology)  DIAGNOSIS:  Encounter Diagnoses  Name Primary?   Malignant neoplasm of upper-outer quadrant of right breast in female, estrogen receptor negative ([place])    Malignant neoplasm of upper-outer quadrant of right female breast, unspecified estrogen receptor status ([place])     SUMMARY OF ONCOLOGIC HISTORY: Oncology History  Breast cancer of upper-outer quadrant of right female breast ([place])  07/19/2014 Initial Biopsy   (R) breast needle biopsy (10:00): IDC, DCIS, grade 2. ER-, PR-, HER2+ (ratio 2.42). Ki67 90%.    07/22/2014 Initial Diagnosis   Breast cancer of upper-outer quadrant of right female breast   08/08/2014 - 11/21/2014 Neo-Adjuvant Chemotherapy   Taxotere/Carbo/Herceptin/Perjeta x 6 cycles.    12/12/2014 - 10/02/2015 Chemotherapy   Maintenance Herceptin (to complete 1 year of therapy).    12/27/2014 Surgery   (R) lumpectomy with SL[facility]): IDC, grade 2, spanning 4 cm, associated high grade DCIS. Negative margins. 1/3 SLN (+).   ER/PR repeated and remain negative.   ypT2, ypN1a: Stage IIB   01/06/2015 Surgery   Bilateral breast mammoplasty ([place]): Right breast  path with intralymphatic emboli of ductal carcinoma. Left breast benign.    02/02/2015 Imaging   CT chest: No findings of metastatic disease in the chest. Right axillary fluid density lesion likely postoperative seroma or hematoma. Small left subpectoral nodes warrant followup attention.   02/02/2015 Imaging   Bone scan: No scintigraphic evidence of osseous metastatic disease   02/16/2015 Surgery   (R) mastectomy and ALND [facility]): Scattered foci of high grade DCIS, scattered intralymphatic tumor emboli. Margins negative. 0/15 LNs.    04/05/2015 - 05/19/2015 Radiation Therapy   Adjuvant XRT ([place]). The right chest wall was treated to 50.4 Gy in 28 fractions at 1.8 Gy per fraction.  The right mastectomy scar was treated to 10 Gy in 5 fractions at 2 Gy per fraction.    12/06/2015 PET scan   Right subpectoral lymph node measuring 9 mm and exhibits malignant range FDG uptake, suspicious for metastatic adenopathy. Subcutaneous nodule within the upper left chest wall exhibits malignant range uptake, which is indeterminate; this is at the site of previous Port-A-Cath and is favored to represent postsurgical change. Indeterminate left adrenal nodules which exhibit mild uptake, favored to represent benign adenoma.   08/11/2017 - 11/03/2017 Chemotherapy   The patient had ado-trastuzumab emtansine (KADCYLA) 320 mg in sodium chloride 0.9 % 250 mL chemo infusion, 3.6 mg/kg = 320 mg, Intravenous, Once, 4 of 5 cycles Administration: 320 mg (08/12/2017), 320 mg (09/01/2017), 320 mg (09/23/2017)  for chemotherapy treatment.    11/18/2017 - 08/30/2020 Chemotherapy   The patient had trastuzumab (HERCEPTIN) 546 mg in sodium chloride 0.9 % 250 mL chemo infusion, 6 mg/kg = 546 mg (100 % of original dose 6 mg/kg), Intravenous,  Once, 2 of 2 cycles Dose modification: 6 mg/kg (original dose 6 mg/kg, Cycle 1,  Reason: Provider Judgment) Administration: 546 mg (11/18/2017), 546 mg (12/16/2017) pertuzumab (PERJETA) 420 mg in  sodium chloride 0.9 % 250 mL chemo infusion, 420 mg (100 % of original dose 420 mg), Intravenous, Once, 15 of 15 cycles Dose modification: 420 mg (original dose 420 mg, Cycle 18), 420 mg (original dose 420 mg, Cycle 27) Administration: 420 mg (10/06/2018), 420 mg (10/27/2018), 420 mg (11/18/2018), 420 mg (12/08/2018), 420 mg (12/29/2018), 420 mg (01/19/2019), 420 mg (02/16/2019), 420 mg (03/16/2019), 420 mg (04/13/2019), 420 mg (06/08/2019), 420 mg (07/06/2019), 420 mg (08/03/2019), 420 mg (08/31/2019), 420 mg (09/28/2019), 420 mg (05/19/2019) trastuzumab-anns (KANJINTI) 546 mg in sodium chloride 0.9 % 250 mL chemo infusion, 6 mg/kg = 546 mg (100 % of original dose 6 mg/kg), Intravenous,  Once, 30 of 30 cycles Dose modification: 6 mg/kg (original dose 6 mg/kg, Cycle 3, Reason: Other (see comments), Comment: requested step therapy by [facility] insurance), 4 mg/kg (original dose 6 mg/kg, Cycle 9, Reason: Provider Judgment), 6 mg/kg (original dose 6 mg/kg, Cycle 17, Reason: Provider Judgment), 4 mg/kg (original dose 6 mg/kg, Cycle 27, Reason: Provider Judgment), 6 mg/kg (original dose 6 mg/kg, Cycle 27, Reason: Provider Judgment) Administration: 546 mg (01/13/2018), 546 mg (02/10/2018), 546 mg (03/03/2018), 546 mg (03/24/2018), 546 mg (04/14/2018), 546 mg (05/05/2018), 357 mg (05/26/2018), 357 mg (06/09/2018), 357 mg (06/23/2018), 357 mg (07/07/2018), 357 mg (07/21/2018), 357 mg (08/04/2018), 357 mg (08/18/2018), 357 mg (09/01/2018), 546 mg (09/15/2018), 546 mg (10/06/2018), 546 mg (10/27/2018), 546 mg (11/18/2018), 546 mg (12/08/2018), 546 mg (12/29/2018), 546 mg (01/19/2019), 546 mg (02/16/2019), 480 mg (03/16/2019), 483 mg (04/13/2019), 450 mg (06/08/2019), 450 mg (07/06/2019), 450 mg (08/03/2019), 450 mg (08/31/2019), 450 mg (09/28/2019)  for chemotherapy treatment.    12/31/2017 - 09/01/2018 Chemotherapy   The patient had PACLitaxel-protein bound (ABRAXANE) chemo infusion 175 mg, 80 mg/m2 = 175 mg (100 % of original dose 80 mg/m2), Intravenous,  Once, 5  of 10 cycles Dose modification: 80 mg/m2 (original dose 80 mg/m2, Cycle 11) Administration: 175 mg (07/07/2018), 175 mg (07/21/2018), 175 mg (08/04/2018), 175 mg (08/18/2018), 175 mg (09/01/2018) PACLitaxel-protein bound (ABRAXANE) chemo infusion 200 mg, 100 mg/m2 = 200 mg, Intravenous, Once, 10 of 10 cycles Dose modification: 80 mg/m2 (original dose 100 mg/m2, Cycle 2, Reason: Provider Judgment), 80 mg/m2 (original dose 100 mg/m2, Cycle 2, Reason: Provider Judgment) Administration: 200 mg (12/31/2017), 200 mg (01/06/2018), 175 mg (01/20/2018), 175 mg (01/27/2018), 175 mg (02/10/2018), 175 mg (03/03/2018), 175 mg (03/10/2018), 175 mg (03/24/2018), 175 mg (04/07/2018), 175 mg (04/14/2018), 175 mg (04/28/2018), 175 mg (05/05/2018), 175 mg (05/26/2018), 175 mg (06/09/2018), 175 mg (05/19/2018)  for chemotherapy treatment.    10/05/2020 - 01/18/2021 Chemotherapy   Patient is on Treatment Plan : BREAST METASTATIC fam-trastuzumab deruxtecan-nxki (Enhertu) q21d     02/19/2021 - 05/14/2021 Chemotherapy   Patient is on Treatment Plan : BREAST Trastuzumab q21d x 13 cycles     06/20/2021 - 08/01/2021 Chemotherapy   Patient is on Treatment Plan : BREAST METASTATIC fam-trastuzumab deruxtecan-nxki (Enhertu) q21d     07/25/2021 -  Chemotherapy   Patient is on Treatment Plan : BREAST METASTATIC Sacituzumab govitecan-hziy Ivette Loyal) D1,8 q21d     08/31/2021 - 09/21/2021 Chemotherapy   Patient is on Treatment Plan : BREAST METASTATIC Sacituzumab govitecan-hziy Ivette Loyal) q21d     Metastasis to liver ([place])  08/04/2017 Initial Diagnosis   Metastasis to liver ([place])   08/12/2017 - 10/14/2017 Chemotherapy   The patient had ado-trastuzumab emtansine (KADCYLA) 320 mg in sodium chloride 0.9 % 250 mL  chemo infusion, 3.6 mg/kg = 320 mg, Intravenous, Once, 4 of 5 cycles Administration: 320 mg (08/12/2017), 320 mg (09/01/2017), 320 mg (09/23/2017)  for chemotherapy treatment.    11/18/2017 - 08/30/2020 Chemotherapy   The patient had trastuzumab  (HERCEPTIN) 546 mg in sodium chloride 0.9 % 250 mL chemo infusion, 6 mg/kg = 546 mg (100 % of original dose 6 mg/kg), Intravenous,  Once, 2 of 2 cycles Dose modification: 6 mg/kg (original dose 6 mg/kg, Cycle 1, Reason: Provider Judgment) Administration: 546 mg (11/18/2017), 546 mg (12/16/2017) pertuzumab (PERJETA) 420 mg in sodium chloride 0.9 % 250 mL chemo infusion, 420 mg (100 % of original dose 420 mg), Intravenous, Once, 15 of 15 cycles Dose modification: 420 mg (original dose 420 mg, Cycle 18), 420 mg (original dose 420 mg, Cycle 27) Administration: 420 mg (10/06/2018), 420 mg (10/27/2018), 420 mg (11/18/2018), 420 mg (12/08/2018), 420 mg (12/29/2018), 420 mg (01/19/2019), 420 mg (02/16/2019), 420 mg (03/16/2019), 420 mg (04/13/2019), 420 mg (06/08/2019), 420 mg (07/06/2019), 420 mg (08/03/2019), 420 mg (08/31/2019), 420 mg (09/28/2019), 420 mg (05/19/2019) trastuzumab-anns (KANJINTI) 546 mg in sodium chloride 0.9 % 250 mL chemo infusion, 6 mg/kg = 546 mg (100 % of original dose 6 mg/kg), Intravenous,  Once, 30 of 30 cycles Dose modification: 6 mg/kg (original dose 6 mg/kg, Cycle 3, Reason: Other (see comments), Comment: requested step therapy by [facility] insurance), 4 mg/kg (original dose 6 mg/kg, Cycle 9, Reason: Provider Judgment), 6 mg/kg (original dose 6 mg/kg, Cycle 17, Reason: Provider Judgment), 4 mg/kg (original dose 6 mg/kg, Cycle 27, Reason: Provider Judgment), 6 mg/kg (original dose 6 mg/kg, Cycle 27, Reason: Provider Judgment) Administration: 546 mg (01/13/2018), 546 mg (02/10/2018), 546 mg (03/03/2018), 546 mg (03/24/2018), 546 mg (04/14/2018), 546 mg (05/05/2018), 357 mg (05/26/2018), 357 mg (06/09/2018), 357 mg (06/23/2018), 357 mg (07/07/2018), 357 mg (07/21/2018), 357 mg (08/04/2018), 357 mg (08/18/2018), 357 mg (09/01/2018), 546 mg (09/15/2018), 546 mg (10/06/2018), 546 mg (10/27/2018), 546 mg (11/18/2018), 546 mg (12/08/2018), 546 mg (12/29/2018), 546 mg (01/19/2019), 546 mg (02/16/2019), 480 mg (03/16/2019), 483 mg  (04/13/2019), 450 mg (06/08/2019), 450 mg (07/06/2019), 450 mg (08/03/2019), 450 mg (08/31/2019), 450 mg (09/28/2019)  for chemotherapy treatment.      CHIEF COMPLIANT: Follow-up Trodelvy cycle 3 day 8    INTERVAL HISTORY: Darlene [name] is a 71 Year-old- with the above mentioned. She presents to the clinic for a follow-up. She reports still tired and having constipation. Denies nausea she takes compazine. Her sugar has gotten better. She took 10 units of Lantus.  Her fatigue is substantial.  Denies any fevers or chills.  Denies any nausea or vomiting.  She does take a Compazine in the morning to ease any nausea concerns.   ALLERGIES:  is allergic to antihistamines, diphenhydramine-type; nsaids; and tape.  MEDICATIONS:  Current Outpatient Medications  Medication Sig Dispense Refill   Accu-Chek Softclix Lancets lancets 1 each by Other route 4 (four) times daily. Test blood sugars 4 x times daily 200 each 1   aspirin EC 81 MG tablet Take 1 tablet (81 mg total) by mouth daily. 90 tablet 3   Bempedoic Acid-Ezetimibe (NEXLIZET) 180-10 MG TABS Take 1 tablet by mouth daily. 90 tablet 3   Blood Glucose Calibration (ACCU-CHEK GUIDE CONTROL) LIQD 1 Bottle by In Vitro route as needed. 1 each 0   Blood Glucose Monitoring Suppl (ACCU-CHEK GUIDE) w/Device KIT 1 each by Does not apply route 4 (four) times daily. Use to test blood sugars 4x a day 1 kit  0   buPROPion (WELLBUTRIN SR) 200 MG 12 hr tablet Take one tablet by mouth once daily. 90 tablet 0   CALCIUM CITRATE-VITAMIN D PO Take 1 tablet by mouth 3 (three) times daily.      carvedilol (COREG) 6.25 MG tablet Take 1 tablet (6.25 mg total) by mouth 2 (two) times daily. 180 tablet 3   cholecalciferol (VITAMIN D) 1000 units tablet Take 1,000 Units by mouth daily.     Cyanocobalamin (VITAMIN B-12 PO) Take 1 tablet by mouth every 14 (fourteen) days.     diphenhydrAMINE (BENADRYL) 25 MG tablet Take 25 mg by mouth daily as needed for allergies or sleep.      glucose blood (ACCU-CHEK GUIDE) test strip Use Accu Chek test strips to check blood sugar 4 times daily. DX:E11.21 400 each 0   insulin aspart (NOVOLOG) 100 UNIT/ML injection Inject into the skin daily as needed for high blood sugar. Per patient, sliding scale with meals as needed     insulin glargine (LANTUS SOLOSTAR) 100 UNIT/ML Solostar Pen Inject 8 Units into the skin daily. 7 mL 3   KERENDIA 20 MG TABS Take 1 tablet by mouth daily. 30 tablet 6   Lactobacillus ([employer] ACIDOPHILUS PROBIOTIC) 0.5 MG TABS Take 1 tablet by mouth daily.     loratadine (CLARITIN) 10 MG tablet Take 10 mg by mouth as needed for allergies.     melatonin 5 MG TABS Take 5 mg by mouth at bedtime.     omeprazole (PRILOSEC) 40 MG capsule Take 1 capsule (40 mg total) by mouth daily. 30 capsule 1   Pediatric Multivitamins-Iron (FLINTSTONES PLUS IRON) chewable tablet Chew 1 tablet by mouth 2 (two) times daily.     polyethylene glycol (MIRALAX / GLYCOLAX) 17 g packet Take 17 g by mouth daily.     Probiotic Product ([employer]S PROBIOTIC PO) Take 1 capsule by mouth daily.      prochlorperazine (COMPAZINE) 10 MG tablet Take 1 tablet (10 mg total) by mouth every 6 (six) hours as needed (Nausea or vomiting). 30 tablet 6   Propylene Glycol (SYSTANE BALANCE OP) Place 1-2 drops into both eyes 3 (three) times daily as needed (for dry eyes).      SURE COMFORT PEN NEEDLES 32G X 4 MM MISC Use as instructed to inject insulin and Victoza daily. 200 each 0   tobramycin (TOBREX) 0.3 % ophthalmic ointment Place 1 application into both eyes as needed (Takes day before eye injections).     vortioxetine HBr (TRINTELLIX) 20 MG TABS tablet Take 1 tablet (20 mg total) by mouth at bedtime. 90 tablet 0   No current facility-administered medications for this visit.   Facility-Administered Medications Ordered in Other Visits  Medication Dose Route Frequency Provider Last Rate Last Admin   acetaminophen (TYLENOL) 325 MG tablet            diphenhydrAMINE  (BENADRYL) 50 MG/ML injection            famotidine (PEPCID) 20-0.9 MG/50ML-% IVPB            palonosetron (ALOXI) 0.25 MG/5ML injection             PHYSICAL EXAMINATION: ECOG PERFORMANCE STATUS: 2 - Symptomatic, <50% confined to bed  Vitals:   10/19/21 1217  BP: (!) 128/57  Pulse: (!) 53  Resp: 18  Temp: 98 F (36.7 C)  SpO2: 97%   Filed Weights   10/19/21 1217  Weight: 181 lb 6.4 oz (82.3 kg)  LABORATORY DATA:  I have reviewed the data as listed    Latest Ref Rng & Units 10/12/2021   10:30 AM 09/27/2021   10:51 AM 09/21/2021   11:33 AM  CMP  Glucose 70 - 99 mg/dL 127  127  248   BUN 8 - 23 mg/dL 32  42  34   Creatinine 0.44 - 1.00 mg/dL 1.55  1.77  1.62   Sodium 135 - 145 mmol/L 142  141  140   Potassium 3.5 - 5.1 mmol/L 4.3  4.7  4.2   Chloride 98 - 111 mmol/L 111  108  108   CO2 22 - 32 mmol/L _0 Calcium 8.9 - 10.3 mg/dL 8.7  9.3  8.7   Total Protein 6.5 - 8.1 g/dL 5.8  6.3  6.0   Total Bilirubin 0.3 - 1.2 mg/dL 0.3  0.4  0.3   Alkaline Phos 38 - 126 U/L 54  61  65   AST 15 - 41 U/L _1 ALT 0 - 44 U/L _2 Lab Results  Component Value Date   WBC 2.5 (L) 10/19/2021   HGB 9.6 (L) 10/19/2021   HCT 27.5 (L) 10/19/2021   MCV 100.0 10/19/2021   PLT 235 10/19/2021   NEUTROABS 1.0 (L) 10/19/2021    ASSESSMENT & PLAN:  Breast cancer of upper-outer quadrant of right female breast ([place]) 07/19/2014: Right breast cancer grade 2 IDC ER/PR negative HER2 positive Ki-67 90% treated with neoadjuvant chemo with TCHP x6 cycles followed by Herceptin maintenance, right lumpectomy had residual disease 4 cm 1/3 lymph node positive, ER/PR negative, adjuvant radiation and capecitabine completed 09/27/2015 12/06/2015: Recurrence: Right subpectoral lymph node and subcutaneous nodule, liver mass (biopsy adenocarcinoma consistent with upper GI or pancreaticobiliary primary, HER2 negative, ER negative) July 2019: Metastatic disease: TDM 1 followed by  Herceptin maintenance, pertuzumab added 10/06/2018, Abraxane 12/31/2017-09/01/2018, Herceptin and pertuzumab maintenance   Prior treatment: Enhertu started 10/05/2020 switched to Herceptin maintenance 02/19/2021 switched to Enhertu at higher dosage x 3 cycles discontinued 08/01/2021    02/04/2021: PET CT scan: Complete metabolic response, no evidence of hypermetabolic disease in the liver (mass measured 2.7 cm previously is now 1.5 cm with no uptake)    08/20/2021: CT CAP: Increased size of the metastatic liver lesions went from 6 cm to 7.5 cm and hepatic dome lesion is now 17 mm previously 10 mm, stable size of the right subpectoral lymph node, no new sites of metastatic disease, stable upper abdominal lymph nodes probably benign  ------------------------------------------------------------------------------------------------------------------------------------------- Current treatment: Ivette Loyal cycle 3 day 1 Liver biopsy was performed 08/30/2021: Poorly differentiated carcinoma consistent with metastatic breast cancer.  ER negative, PR negative, GCDFP negative, HER2 1+ Molecular testing: NTRK 1: Amplified, genomic LOH: High, TMB: 4 MSI stable (PIK 3 CA: Not detected, BRCA not detected)   Toxicities: 1.  Mild nausea 2. severe fatigue 3.  Chemo-induced anemia: Monitoring, hemoglobin 9.5 4.  Mild leukopenia: ANC today is 1.  We will reduce the dosage of her treatment and we will proceed with today's treatment. 5.  Alopecia    RTC in 3 weeks with scans She plans to go to [place] in the first 2 weeks of November.  We may have to postpone her treatment accordingly.    No orders of the defined types were placed in this encounter.  The patient has a good understanding of the overall plan. she agrees  with it. she will call with any problems that may develop before the next visit here. Total time spent: 30 mins including face to face time and time spent for planning, charting and co-ordination of  care   Harriette Ohara, MD 10/19/21    I Gardiner Coins am scribing for Dr. Lindi Adie  I have reviewed the above documentation for accuracy and completeness, and I agree with the above.

## 2021-10-19 ENCOUNTER — Inpatient Hospital Stay (HOSPITAL_BASED_OUTPATIENT_CLINIC_OR_DEPARTMENT_OTHER): Payer: Medicare PPO | Admitting: Hematology and Oncology

## 2021-10-19 ENCOUNTER — Other Ambulatory Visit (INDEPENDENT_AMBULATORY_CARE_PROVIDER_SITE_OTHER): Payer: Self-pay | Admitting: Family Medicine

## 2021-10-19 ENCOUNTER — Inpatient Hospital Stay: Payer: Medicare PPO

## 2021-10-19 VITALS — BP 138/58 | HR 60 | Resp 18

## 2021-10-19 DIAGNOSIS — C50811 Malignant neoplasm of overlapping sites of right female breast: Secondary | ICD-10-CM | POA: Diagnosis not present

## 2021-10-19 DIAGNOSIS — C50411 Malignant neoplasm of upper-outer quadrant of right female breast: Secondary | ICD-10-CM

## 2021-10-19 DIAGNOSIS — Z171 Estrogen receptor negative status [ER-]: Secondary | ICD-10-CM

## 2021-10-19 DIAGNOSIS — Z5112 Encounter for antineoplastic immunotherapy: Secondary | ICD-10-CM | POA: Diagnosis not present

## 2021-10-19 DIAGNOSIS — D701 Agranulocytosis secondary to cancer chemotherapy: Secondary | ICD-10-CM | POA: Diagnosis not present

## 2021-10-19 DIAGNOSIS — Z452 Encounter for adjustment and management of vascular access device: Secondary | ICD-10-CM | POA: Diagnosis not present

## 2021-10-19 DIAGNOSIS — C787 Secondary malignant neoplasm of liver and intrahepatic bile duct: Secondary | ICD-10-CM | POA: Diagnosis not present

## 2021-10-19 DIAGNOSIS — D6481 Anemia due to antineoplastic chemotherapy: Secondary | ICD-10-CM | POA: Diagnosis not present

## 2021-10-19 DIAGNOSIS — Z95828 Presence of other vascular implants and grafts: Secondary | ICD-10-CM

## 2021-10-19 LAB — CBC WITH DIFFERENTIAL (CANCER CENTER ONLY)
Abs Immature Granulocytes: 0.02 10*3/uL (ref 0.00–0.07)
Basophils Absolute: 0 10*3/uL (ref 0.0–0.1)
Basophils Relative: 0 %
Eosinophils Absolute: 0.1 10*3/uL (ref 0.0–0.5)
Eosinophils Relative: 5 %
HCT: 27.5 % — ABNORMAL LOW (ref 36.0–46.0)
Hemoglobin: 9.6 g/dL — ABNORMAL LOW (ref 12.0–15.0)
Immature Granulocytes: 1 %
Lymphocytes Relative: 40 %
Lymphs Abs: 1 10*3/uL (ref 0.7–4.0)
MCH: 34.9 pg — ABNORMAL HIGH (ref 26.0–34.0)
MCHC: 34.9 g/dL (ref 30.0–36.0)
MCV: 100 fL (ref 80.0–100.0)
Monocytes Absolute: 0.4 10*3/uL (ref 0.1–1.0)
Monocytes Relative: 14 %
Neutro Abs: 1 10*3/uL — ABNORMAL LOW (ref 1.7–7.7)
Neutrophils Relative %: 40 %
Platelet Count: 235 10*3/uL (ref 150–400)
RBC: 2.75 MIL/uL — ABNORMAL LOW (ref 3.87–5.11)
RDW: 13 % (ref 11.5–15.5)
WBC Count: 2.5 10*3/uL — ABNORMAL LOW (ref 4.0–10.5)
nRBC: 0 % (ref 0.0–0.2)

## 2021-10-19 LAB — CMP (CANCER CENTER ONLY)
ALT: 12 U/L (ref 0–44)
AST: 21 U/L (ref 15–41)
Albumin: 3.8 g/dL (ref 3.5–5.0)
Alkaline Phosphatase: 55 U/L (ref 38–126)
Anion gap: 4 — ABNORMAL LOW (ref 5–15)
BUN: 39 mg/dL — ABNORMAL HIGH (ref 8–23)
CO2: 30 mmol/L (ref 22–32)
Calcium: 8.9 mg/dL (ref 8.9–10.3)
Chloride: 106 mmol/L (ref 98–111)
Creatinine: 1.83 mg/dL — ABNORMAL HIGH (ref 0.44–1.00)
GFR, Estimated: 30 mL/min — ABNORMAL LOW (ref >60)
Glucose, Bld: 152 mg/dL — ABNORMAL HIGH (ref 70–99)
Potassium: 4.7 mmol/L (ref 3.5–5.1)
Sodium: 140 mmol/L (ref 135–145)
Total Bilirubin: 0.4 mg/dL (ref 0.3–1.2)
Total Protein: 6.2 g/dL — ABNORMAL LOW (ref 6.5–8.1)

## 2021-10-19 LAB — CEA (IN HOUSE-CHCC): CEA (CHCC-In House): 828.52 ng/mL — ABNORMAL HIGH (ref 0.00–5.00)

## 2021-10-19 MED ORDER — PALONOSETRON HCL INJECTION 0.25 MG/5ML
0.2500 mg | Freq: Once | INTRAVENOUS | Status: AC
  Administered 2021-10-19: 0.25 mg via INTRAVENOUS
  Filled 2021-10-19: qty 5

## 2021-10-19 MED ORDER — SODIUM CHLORIDE 0.9% FLUSH
10.0000 mL | Freq: Once | INTRAVENOUS | Status: AC
  Administered 2021-10-19: 10 mL

## 2021-10-19 MED ORDER — CETIRIZINE HCL 10 MG/ML IV SOLN
10.0000 mg | Freq: Once | INTRAVENOUS | Status: AC
  Administered 2021-10-19: 10 mg via INTRAVENOUS
  Filled 2021-10-19: qty 1

## 2021-10-19 MED ORDER — ACETAMINOPHEN 325 MG PO TABS
650.0000 mg | ORAL_TABLET | Freq: Once | ORAL | Status: AC
  Administered 2021-10-19: 650 mg via ORAL
  Filled 2021-10-19: qty 2

## 2021-10-19 MED ORDER — SODIUM CHLORIDE 0.9 % IV SOLN
10.0000 mg | Freq: Once | INTRAVENOUS | Status: AC
  Administered 2021-10-19: 10 mg via INTRAVENOUS
  Filled 2021-10-19: qty 10

## 2021-10-19 MED ORDER — CVS ACIDOPHILUS PROBIOTIC 0.5 MG PO TABS: 1.0000 | ORAL_TABLET | Freq: Every day | ORAL | Status: DC

## 2021-10-19 MED ORDER — HEPARIN SOD (PORK) LOCK FLUSH 100 UNIT/ML IV SOLN
250.0000 [IU] | Freq: Once | INTRAVENOUS | Status: AC | PRN
  Administered 2021-10-19: 250 [IU]

## 2021-10-19 MED ORDER — SODIUM CHLORIDE 0.9 % IV SOLN
6.0000 mg/kg | Freq: Once | INTRAVENOUS | Status: AC
  Administered 2021-10-19: 480 mg via INTRAVENOUS
  Filled 2021-10-19: qty 48

## 2021-10-19 MED ORDER — SODIUM CHLORIDE 0.9 % IV SOLN
150.0000 mg | Freq: Once | INTRAVENOUS | Status: AC
  Administered 2021-10-19: 150 mg via INTRAVENOUS
  Filled 2021-10-19: qty 150

## 2021-10-19 MED ORDER — FAMOTIDINE IN NACL 20-0.9 MG/50ML-% IV SOLN
20.0000 mg | Freq: Once | INTRAVENOUS | Status: AC
  Administered 2021-10-19: 20 mg via INTRAVENOUS
  Filled 2021-10-19: qty 50

## 2021-10-19 MED ORDER — SODIUM CHLORIDE 0.9 % IV SOLN: Freq: Once | INTRAVENOUS | Status: AC

## 2021-10-19 MED ORDER — SODIUM CHLORIDE 0.9% FLUSH
10.0000 mL | INTRAVENOUS | Status: DC | PRN
  Administered 2021-10-19 (×2): 10 mL

## 2021-10-19 MED ORDER — HEPARIN SOD (PORK) LOCK FLUSH 100 UNIT/ML IV SOLN
500.0000 [IU] | Freq: Once | INTRAVENOUS | Status: AC | PRN
  Administered 2021-10-19: 500 [IU]

## 2021-10-19 NOTE — Assessment & Plan Note (Deleted)
07/19/2014: Right breast cancer grade 2 IDC ER/PR negative HER2 positive Ki-67 90% treated with neoadjuvant chemo with TCHP x6 cycles followed by Herceptin maintenance, right lumpectomy had residual disease 4 cm 1/3 lymph node positive, ER/PR negative, adjuvant radiation and capecitabine completed 09/27/2015 12/06/2015: Recurrence: Right subpectoral lymph node and subcutaneous nodule, liver mass (biopsy adenocarcinoma consistent with upper GI or pancreaticobiliary primary, HER2 negative, ER negative) July 2019: Metastatic disease: TDM 1 followed by Herceptin maintenance,pertuzumabadded 10/06/2018, Abraxane 12/31/2017-09/01/2018, Herceptin and pertuzumab maintenance  Priortreatment: Enhertu started 9/15/2022switched to Herceptin maintenance 1/30/2023switched to Enhertu at higher dosage x 3 cycles discontinued 08/01/2021   02/04/2021: PET CT scan: Complete metabolic response, no evidence of hypermetabolic disease in the liver (mass measured 2.7 cm previously is now 1.5 cm with no uptake)  08/20/2021: CT CAP: Increased size of the metastatic liver lesions went from 6 cm to 7.5 cm and hepatic dome lesion is now 17 mm previously 10 mm, stable size of the right subpectoral lymph node, no new sites of metastatic disease, stable upper abdominal lymph nodes probably benign ------------------------------------------------------------------------------------------------------------------------------------------- Current treatment: Ivette Loyal cycle3 day 1 Liver biopsy was performed 08/30/2021: Poorly differentiated carcinoma consistent with metastatic breast cancer. ER negative, PR negative, GCDFP negative, HER2 1+ Molecular testing:NTRK 1: Amplified, genomic LOH: High, TMB: 4 MSI stable (PIK 3 CA: Not detected, BRCA not detected)  Toxicities: 1.Mild nausea 2.severe fatigue 3.Chemo-induced anemia: Monitoring, hemoglobin 9.5 4.Mild leukopenia: WBC 3.3 5.Alopecia We may reduce the dosage of  chemotherapy based upon the results of the CT scans. It is concerning that the CEA level is not coming down.  RTC in 3 weeks with scans

## 2021-10-19 NOTE — Patient Instructions (Signed)
[facility]ICAL [facility]COLOGY  Discharge Instructions: Thank you for choosing [facility] to provide your oncology and hematology care.   If you have a lab appointment with the Cancer Center, please go directly to the Cancer Center and check in at the registration area.   Wear comfortable clothing and clothing appropriate for easy access to any Portacath or PICC line.   We strive to give you quality time with your provider. You may need to reschedule your appointment if you arrive late (15 or more minutes).  Arriving late affects you and other patients whose appointments are after yours.  Also, if you miss three or more appointments without notifying the office, you may be dismissed from the clinic at the provider's discretion.      For prescription refill requests, have your pharmacy contact our office and allow 72 hours for refills to be completed.    Today you received the following chemotherapy and/or immunotherapy agents: Trodelvy      To help prevent nausea and vomiting after your treatment, we encourage you to take your nausea medication as directed.  BELOW ARE SYMPTOMS THAT SHOULD BE REPORTED IMMEDIATELY: *FEVER GREATER THAN 100.4 F (38 C) OR HIGHER *CHILLS OR SWEATING *NAUSEA AND VOMITING THAT IS NOT CONTROLLED WITH YOUR NAUSEA MEDICATION *UNUSUAL SHORTNESS OF BREATH *UNUSUAL BRUISING OR BLEEDING *URINARY PROBLEMS (pain or burning when urinating, or frequent urination) *BOWEL PROBLEMS (unusual diarrhea, constipation, pain near the anus) TENDERNESS IN MOUTH AND THROAT WITH OR WITHOUT PRESENCE OF ULCERS (sore throat, sores in mouth, or a toothache) UNUSUAL RASH, SWELLING OR PAIN  UNUSUAL VAGINAL DISCHARGE OR ITCHING   Items with * indicate a potential emergency and should be followed up as soon as possible or go to the Emergency Department if any problems should occur.  Please show the CHEMOTHERAPY ALERT CARD or IMMUNOTHERAPY ALERT CARD at check-in to  the Emergency Department and triage nurse.  Should you have questions after your visit or need to cancel or reschedule your appointment, please contact [facility]  Dept: [phone]  and follow the prompts.  Office hours are 8:00 a.m. to 4:30 p.m. Monday - Friday. Please note that voicemails left after 4:00 p.m. may not be returned until the following business day.  We are closed weekends and major holidays. You have access to a nurse at all times for urgent questions. Please call the main number to the clinic Dept: [phone] and follow the prompts.   For any non-urgent questions, you may also contact your provider using MyChart. We now offer e-Visits for anyone 18 and older to request care online for non-urgent symptoms. For details visit [email or web address].   Also download the MyChart app! Go to the app store, search "MyChart", open the app, select , and log in with your MyChart username and password.  Masks are optional in the cancer centers. If you would like for your care team to wear a mask while they are taking care of you, please let them know. You may have one support person who is at least 69 years old accompany you for your appointments. 

## 2021-10-22 ENCOUNTER — Encounter: Payer: Self-pay | Admitting: Hematology and Oncology

## 2021-10-22 ENCOUNTER — Other Ambulatory Visit (INDEPENDENT_AMBULATORY_CARE_PROVIDER_SITE_OTHER): Payer: Self-pay | Admitting: Cardiology

## 2021-10-24 ENCOUNTER — Encounter: Payer: Medicare PPO | Admitting: Physical Therapy

## 2021-10-25 ENCOUNTER — Other Ambulatory Visit (INDEPENDENT_AMBULATORY_CARE_PROVIDER_SITE_OTHER): Payer: Self-pay | Admitting: Family Medicine

## 2021-10-26 ENCOUNTER — Other Ambulatory Visit (INDEPENDENT_AMBULATORY_CARE_PROVIDER_SITE_OTHER): Payer: Self-pay | Admitting: Family Medicine

## 2021-10-27 ENCOUNTER — Other Ambulatory Visit: Payer: Self-pay

## 2021-10-28 ENCOUNTER — Other Ambulatory Visit: Payer: Self-pay

## 2021-10-29 ENCOUNTER — Other Ambulatory Visit (INDEPENDENT_AMBULATORY_CARE_PROVIDER_SITE_OTHER): Payer: Self-pay | Admitting: Cardiology

## 2021-10-30 DIAGNOSIS — F339 Major depressive disorder, recurrent, unspecified: Secondary | ICD-10-CM | POA: Diagnosis not present

## 2021-10-30 DIAGNOSIS — R269 Unspecified abnormalities of gait and mobility: Secondary | ICD-10-CM | POA: Diagnosis not present

## 2021-10-30 DIAGNOSIS — I10 Essential (primary) hypertension: Secondary | ICD-10-CM | POA: Diagnosis not present

## 2021-10-30 DIAGNOSIS — M25572 Pain in left ankle and joints of left foot: Secondary | ICD-10-CM | POA: Diagnosis not present

## 2021-10-30 DIAGNOSIS — M6259 Muscle wasting and atrophy, not elsewhere classified, multiple sites: Secondary | ICD-10-CM | POA: Diagnosis not present

## 2021-10-30 DIAGNOSIS — Z9181 History of falling: Secondary | ICD-10-CM | POA: Diagnosis not present

## 2021-10-30 DIAGNOSIS — M25671 Stiffness of right ankle, not elsewhere classified: Secondary | ICD-10-CM | POA: Diagnosis not present

## 2021-10-30 DIAGNOSIS — E118 Type 2 diabetes mellitus with unspecified complications: Secondary | ICD-10-CM | POA: Diagnosis not present

## 2021-10-30 NOTE — Progress Notes (Signed)
Patient Care Team: Isaac Bliss, Rayford Halsted, MD as PCP - General (Internal Medicine) Larey Dresser, MD as PCP - Advanced Heart Failure (Cardiology) Stark Klein, MD as Consulting Physician (General Surgery) Jacelyn Pi, MD as Consulting Physician (Endocrinology) [name], MD as Consulting Physician (Plastic Surgery) Gery Pray, MD as Consulting Physician (Radiation Oncology) Juanita Craver, MD as Consulting Physician (Gastroenterology) Alda Berthold, DO as Consulting Physician (Neurology) Starlyn Skeans, MD as Consulting Physician (Family Medicine) Sherlynn Stalls, MD as Consulting Physician (Ophthalmology) Salvadore Dom, MD as Consulting Physician (Obstetrics and Gynecology) Raina Mina, RPH-CPP (Pharmacist) Nicholas Lose, MD as Consulting Physician (Hematology and Oncology)  DIAGNOSIS:  Encounter Diagnoses  Name Primary?   Malignant neoplasm of upper-outer quadrant of right breast in female, estrogen receptor negative ([place])    Malignant neoplasm of upper-outer quadrant of right female breast, unspecified estrogen receptor status ([place])     SUMMARY OF ONCOLOGIC HISTORY: Oncology History  Breast cancer of upper-outer quadrant of right female breast ([place])  07/19/2014 Initial Biopsy   (R) breast needle biopsy (10:00): IDC, DCIS, grade 2. ER-, PR-, HER2+ (ratio 2.42). Ki67 90%.    07/22/2014 Initial Diagnosis   Breast cancer of upper-outer quadrant of right female breast   08/08/2014 - 11/21/2014 Neo-Adjuvant Chemotherapy   Taxotere/Carbo/Herceptin/Perjeta x 6 cycles.    12/12/2014 - 10/02/2015 Chemotherapy   Maintenance Herceptin (to complete 1 year of therapy).    12/27/2014 Surgery   (R) lumpectomy with SL[facility]): IDC, grade 2, spanning 4 cm, associated high grade DCIS. Negative margins. 1/3 SLN (+).   ER/PR repeated and remain negative.   ypT2, ypN1a: Stage IIB   01/06/2015 Surgery   Bilateral breast mammoplasty ([place]): Right breast  path with intralymphatic emboli of ductal carcinoma. Left breast benign.    02/02/2015 Imaging   CT chest: No findings of metastatic disease in the chest. Right axillary fluid density lesion likely postoperative seroma or hematoma. Small left subpectoral nodes warrant followup attention.   02/02/2015 Imaging   Bone scan: No scintigraphic evidence of osseous metastatic disease   02/16/2015 Surgery   (R) mastectomy and ALND [facility]): Scattered foci of high grade DCIS, scattered intralymphatic tumor emboli. Margins negative. 0/15 LNs.    04/05/2015 - 05/19/2015 Radiation Therapy   Adjuvant XRT ([place]). The right chest wall was treated to 50.4 Gy in 28 fractions at 1.8 Gy per fraction.  The right mastectomy scar was treated to 10 Gy in 5 fractions at 2 Gy per fraction.    12/06/2015 PET scan   Right subpectoral lymph node measuring 9 mm and exhibits malignant range FDG uptake, suspicious for metastatic adenopathy. Subcutaneous nodule within the upper left chest wall exhibits malignant range uptake, which is indeterminate; this is at the site of previous Port-A-Cath and is favored to represent postsurgical change. Indeterminate left adrenal nodules which exhibit mild uptake, favored to represent benign adenoma.   08/11/2017 - 11/03/2017 Chemotherapy   The patient had ado-trastuzumab emtansine (KADCYLA) 320 mg in sodium chloride 0.9 % 250 mL chemo infusion, 3.6 mg/kg = 320 mg, Intravenous, Once, 4 of 5 cycles Administration: 320 mg (08/12/2017), 320 mg (09/01/2017), 320 mg (09/23/2017)  for chemotherapy treatment.    11/18/2017 - 08/30/2020 Chemotherapy   The patient had trastuzumab (HERCEPTIN) 546 mg in sodium chloride 0.9 % 250 mL chemo infusion, 6 mg/kg = 546 mg (100 % of original dose 6 mg/kg), Intravenous,  Once, 2 of 2 cycles Dose modification: 6 mg/kg (original dose 6 mg/kg, Cycle 1,  Reason: Provider Judgment) Administration: 546 mg (11/18/2017), 546 mg (12/16/2017) pertuzumab (PERJETA) 420 mg in  sodium chloride 0.9 % 250 mL chemo infusion, 420 mg (100 % of original dose 420 mg), Intravenous, Once, 15 of 15 cycles Dose modification: 420 mg (original dose 420 mg, Cycle 18), 420 mg (original dose 420 mg, Cycle 27) Administration: 420 mg (10/06/2018), 420 mg (10/27/2018), 420 mg (11/18/2018), 420 mg (12/08/2018), 420 mg (12/29/2018), 420 mg (01/19/2019), 420 mg (02/16/2019), 420 mg (03/16/2019), 420 mg (04/13/2019), 420 mg (06/08/2019), 420 mg (07/06/2019), 420 mg (08/03/2019), 420 mg (08/31/2019), 420 mg (09/28/2019), 420 mg (05/19/2019) trastuzumab-anns (KANJINTI) 546 mg in sodium chloride 0.9 % 250 mL chemo infusion, 6 mg/kg = 546 mg (100 % of original dose 6 mg/kg), Intravenous,  Once, 30 of 30 cycles Dose modification: 6 mg/kg (original dose 6 mg/kg, Cycle 3, Reason: Other (see comments), Comment: requested step therapy by [facility] insurance), 4 mg/kg (original dose 6 mg/kg, Cycle 9, Reason: Provider Judgment), 6 mg/kg (original dose 6 mg/kg, Cycle 17, Reason: Provider Judgment), 4 mg/kg (original dose 6 mg/kg, Cycle 27, Reason: Provider Judgment), 6 mg/kg (original dose 6 mg/kg, Cycle 27, Reason: Provider Judgment) Administration: 546 mg (01/13/2018), 546 mg (02/10/2018), 546 mg (03/03/2018), 546 mg (03/24/2018), 546 mg (04/14/2018), 546 mg (05/05/2018), 357 mg (05/26/2018), 357 mg (06/09/2018), 357 mg (06/23/2018), 357 mg (07/07/2018), 357 mg (07/21/2018), 357 mg (08/04/2018), 357 mg (08/18/2018), 357 mg (09/01/2018), 546 mg (09/15/2018), 546 mg (10/06/2018), 546 mg (10/27/2018), 546 mg (11/18/2018), 546 mg (12/08/2018), 546 mg (12/29/2018), 546 mg (01/19/2019), 546 mg (02/16/2019), 480 mg (03/16/2019), 483 mg (04/13/2019), 450 mg (06/08/2019), 450 mg (07/06/2019), 450 mg (08/03/2019), 450 mg (08/31/2019), 450 mg (09/28/2019)  for chemotherapy treatment.    12/31/2017 - 09/01/2018 Chemotherapy   The patient had PACLitaxel-protein bound (ABRAXANE) chemo infusion 175 mg, 80 mg/m2 = 175 mg (100 % of original dose 80 mg/m2), Intravenous,  Once, 5  of 10 cycles Dose modification: 80 mg/m2 (original dose 80 mg/m2, Cycle 11) Administration: 175 mg (07/07/2018), 175 mg (07/21/2018), 175 mg (08/04/2018), 175 mg (08/18/2018), 175 mg (09/01/2018) PACLitaxel-protein bound (ABRAXANE) chemo infusion 200 mg, 100 mg/m2 = 200 mg, Intravenous, Once, 10 of 10 cycles Dose modification: 80 mg/m2 (original dose 100 mg/m2, Cycle 2, Reason: Provider Judgment), 80 mg/m2 (original dose 100 mg/m2, Cycle 2, Reason: Provider Judgment) Administration: 200 mg (12/31/2017), 200 mg (01/06/2018), 175 mg (01/20/2018), 175 mg (01/27/2018), 175 mg (02/10/2018), 175 mg (03/03/2018), 175 mg (03/10/2018), 175 mg (03/24/2018), 175 mg (04/07/2018), 175 mg (04/14/2018), 175 mg (04/28/2018), 175 mg (05/05/2018), 175 mg (05/26/2018), 175 mg (06/09/2018), 175 mg (05/19/2018)  for chemotherapy treatment.    10/05/2020 - 01/18/2021 Chemotherapy   Patient is on Treatment Plan : BREAST METASTATIC fam-trastuzumab deruxtecan-nxki (Enhertu) q21d     02/19/2021 - 05/14/2021 Chemotherapy   Patient is on Treatment Plan : BREAST Trastuzumab q21d x 13 cycles     06/20/2021 - 08/01/2021 Chemotherapy   Patient is on Treatment Plan : BREAST METASTATIC fam-trastuzumab deruxtecan-nxki (Enhertu) q21d     07/25/2021 -  Chemotherapy   Patient is on Treatment Plan : BREAST METASTATIC Sacituzumab govitecan-hziy Ivette Loyal) D1,8 q21d     08/31/2021 - 09/21/2021 Chemotherapy   Patient is on Treatment Plan : BREAST METASTATIC Sacituzumab govitecan-hziy Ivette Loyal) q21d     Metastasis to liver ([place])  08/04/2017 Initial Diagnosis   Metastasis to liver ([place])   08/12/2017 - 10/14/2017 Chemotherapy   The patient had ado-trastuzumab emtansine (KADCYLA) 320 mg in sodium chloride 0.9 % 250 mL  chemo infusion, 3.6 mg/kg = 320 mg, Intravenous, Once, 4 of 5 cycles Administration: 320 mg (08/12/2017), 320 mg (09/01/2017), 320 mg (09/23/2017)  for chemotherapy treatment.    11/18/2017 - 08/30/2020 Chemotherapy   The patient had trastuzumab  (HERCEPTIN) 546 mg in sodium chloride 0.9 % 250 mL chemo infusion, 6 mg/kg = 546 mg (100 % of original dose 6 mg/kg), Intravenous,  Once, 2 of 2 cycles Dose modification: 6 mg/kg (original dose 6 mg/kg, Cycle 1, Reason: Provider Judgment) Administration: 546 mg (11/18/2017), 546 mg (12/16/2017) pertuzumab (PERJETA) 420 mg in sodium chloride 0.9 % 250 mL chemo infusion, 420 mg (100 % of original dose 420 mg), Intravenous, Once, 15 of 15 cycles Dose modification: 420 mg (original dose 420 mg, Cycle 18), 420 mg (original dose 420 mg, Cycle 27) Administration: 420 mg (10/06/2018), 420 mg (10/27/2018), 420 mg (11/18/2018), 420 mg (12/08/2018), 420 mg (12/29/2018), 420 mg (01/19/2019), 420 mg (02/16/2019), 420 mg (03/16/2019), 420 mg (04/13/2019), 420 mg (06/08/2019), 420 mg (07/06/2019), 420 mg (08/03/2019), 420 mg (08/31/2019), 420 mg (09/28/2019), 420 mg (05/19/2019) trastuzumab-anns (KANJINTI) 546 mg in sodium chloride 0.9 % 250 mL chemo infusion, 6 mg/kg = 546 mg (100 % of original dose 6 mg/kg), Intravenous,  Once, 30 of 30 cycles Dose modification: 6 mg/kg (original dose 6 mg/kg, Cycle 3, Reason: Other (see comments), Comment: requested step therapy by [facility] insurance), 4 mg/kg (original dose 6 mg/kg, Cycle 9, Reason: Provider Judgment), 6 mg/kg (original dose 6 mg/kg, Cycle 17, Reason: Provider Judgment), 4 mg/kg (original dose 6 mg/kg, Cycle 27, Reason: Provider Judgment), 6 mg/kg (original dose 6 mg/kg, Cycle 27, Reason: Provider Judgment) Administration: 546 mg (01/13/2018), 546 mg (02/10/2018), 546 mg (03/03/2018), 546 mg (03/24/2018), 546 mg (04/14/2018), 546 mg (05/05/2018), 357 mg (05/26/2018), 357 mg (06/09/2018), 357 mg (06/23/2018), 357 mg (07/07/2018), 357 mg (07/21/2018), 357 mg (08/04/2018), 357 mg (08/18/2018), 357 mg (09/01/2018), 546 mg (09/15/2018), 546 mg (10/06/2018), 546 mg (10/27/2018), 546 mg (11/18/2018), 546 mg (12/08/2018), 546 mg (12/29/2018), 546 mg (01/19/2019), 546 mg (02/16/2019), 480 mg (03/16/2019), 483 mg  (04/13/2019), 450 mg (06/08/2019), 450 mg (07/06/2019), 450 mg (08/03/2019), 450 mg (08/31/2019), 450 mg (09/28/2019)  for chemotherapy treatment.      CHIEF COMPLIANT: Follow-up Trodelvy and scans  INTERVAL HISTORY: Darlene [name] is a 71 Year-old- with the above mentioned. She presents to the clinic for a follow-up to review scans.    ALLERGIES:  is allergic to antihistamines, diphenhydramine-type; nsaids; and tape.  MEDICATIONS:  Current Outpatient Medications  Medication Sig Dispense Refill   Accu-Chek Softclix Lancets lancets 1 each by Other route 4 (four) times daily. Test blood sugars 4 x times daily 200 each 1   aspirin EC 81 MG tablet Take 1 tablet (81 mg total) by mouth daily. 90 tablet 3   Bempedoic Acid-Ezetimibe (NEXLIZET) 180-10 MG TABS Take 1 tablet by mouth daily. 90 tablet 3   Blood Glucose Calibration (ACCU-CHEK GUIDE CONTROL) LIQD 1 Bottle by In Vitro route as needed. 1 each 0   Blood Glucose Monitoring Suppl (ACCU-CHEK GUIDE) w/Device KIT 1 each by Does not apply route 4 (four) times daily. Use to test blood sugars 4x a day 1 kit 0   buPROPion (WELLBUTRIN SR) 200 MG 12 hr tablet Take one tablet by mouth once daily. 90 tablet 0   CALCIUM CITRATE-VITAMIN D PO Take 1 tablet by mouth 3 (three) times daily.      capecitabine (XELODA) 500 MG tablet Take 3 tablets (1,500 mg total) by mouth  2 (two) times daily after a meal. 84 tablet 3   carvedilol (COREG) 6.25 MG tablet Take 1 tablet (6.25 mg total) by mouth 2 (two) times daily. 180 tablet 3   cholecalciferol (VITAMIN D) 1000 units tablet Take 1,000 Units by mouth daily.     Cyanocobalamin (VITAMIN B-12 PO) Take 1 tablet by mouth every 14 (fourteen) days.     diphenhydrAMINE (BENADRYL) 25 MG tablet Take 25 mg by mouth daily as needed for allergies or sleep.     glucose blood (ACCU-CHEK GUIDE) test strip Use Accu Chek test strips to check blood sugar 4 times daily. DX:E11.21 400 each 0   insulin aspart (NOVOLOG) 100 UNIT/ML  injection Inject into the skin daily as needed for high blood sugar. Per patient, sliding scale with meals as needed     insulin glargine (LANTUS SOLOSTAR) 100 UNIT/ML Solostar Pen Inject 8 Units into the skin daily. 7 mL 3   KERENDIA 20 MG TABS Take 1 tablet by mouth daily. 30 tablet 6   Lactobacillus ([employer] ACIDOPHILUS PROBIOTIC) 0.5 MG TABS Take 1 tablet by mouth daily.     loratadine (CLARITIN) 10 MG tablet Take 10 mg by mouth as needed for allergies.     melatonin 5 MG TABS Take 5 mg by mouth at bedtime.     omeprazole (PRILOSEC) 40 MG capsule Take 1 capsule (40 mg total) by mouth daily. 30 capsule 1   Pediatric Multivitamins-Iron (FLINTSTONES PLUS IRON) chewable tablet Chew 1 tablet by mouth 2 (two) times daily.     polyethylene glycol (MIRALAX / GLYCOLAX) 17 g packet Take 17 g by mouth daily.     Probiotic Product ([employer]S PROBIOTIC PO) Take 1 capsule by mouth daily.      prochlorperazine (COMPAZINE) 10 MG tablet Take 1 tablet (10 mg total) by mouth every 6 (six) hours as needed (Nausea or vomiting). 30 tablet 6   Propylene Glycol (SYSTANE BALANCE OP) Place 1-2 drops into both eyes 3 (three) times daily as needed (for dry eyes).      SURE COMFORT PEN NEEDLES 32G X 4 MM MISC Use as instructed to inject insulin and Victoza daily. 200 each 0   tobramycin (TOBREX) 0.3 % ophthalmic ointment Place 1 application into both eyes as needed (Takes day before eye injections).     vortioxetine HBr (TRINTELLIX) 20 MG TABS tablet Take 1 tablet (20 mg total) by mouth at bedtime. 90 tablet 0   No current facility-administered medications for this visit.   Facility-Administered Medications Ordered in Other Visits  Medication Dose Route Frequency Provider Last Rate Last Admin   acetaminophen (TYLENOL) 325 MG tablet            diphenhydrAMINE (BENADRYL) 50 MG/ML injection            famotidine (PEPCID) 20-0.9 MG/50ML-% IVPB            palonosetron (ALOXI) 0.25 MG/5ML injection             PHYSICAL  EXAMINATION: ECOG PERFORMANCE STATUS: 1 - Symptomatic but completely ambulatory  Vitals:   11/02/21 0952  BP: (!) 144/59  Pulse: (!) 56  Temp: 98.1 F (36.7 C)  SpO2: 100%   Filed Weights   11/02/21 0952  Weight: 183 lb 12.8 oz (83.4 kg)    BREAST: No palpable masses or nodules in either right or left breasts. No palpable axillary supraclavicular or infraclavicular adenopathy no breast tenderness or nipple discharge. (exam performed in the presence of a chaperone)  LABORATORY DATA:  I have reviewed the data as listed    Latest Ref Rng & Units 11/02/2021    9:29 AM 10/19/2021   12:02 PM 10/12/2021   10:30 AM  CMP  Glucose 70 - 99 mg/dL 126  152  127   BUN 8 - 23 mg/dL 36  39  32   Creatinine 0.44 - 1.00 mg/dL 1.71  1.83  1.55   Sodium 135 - 145 mmol/L 141  140  142   Potassium 3.5 - 5.1 mmol/L 4.2  4.7  4.3   Chloride 98 - 111 mmol/L 109  106  111   CO2 22 - 32 mmol/L $RemoveB'29  30  27   'OvnxELNr$ Calcium 8.9 - 10.3 mg/dL 8.9  8.9  8.7   Total Protein 6.5 - 8.1 g/dL 6.1  6.2  5.8   Total Bilirubin 0.3 - 1.2 mg/dL 0.4  0.4  0.3   Alkaline Phos 38 - 126 U/L 54  55  54   AST 15 - 41 U/L $Remo'22  21  22   'QFFIP$ ALT 0 - 44 U/L $Remo'12  12  12     'xiTFs$ Lab Results  Component Value Date   WBC 4.1 11/02/2021   HGB 9.8 (L) 11/02/2021   HCT 28.8 (L) 11/02/2021   MCV 99.3 11/02/2021   PLT 208 11/02/2021   NEUTROABS 2.6 11/02/2021    ASSESSMENT & PLAN:  Breast cancer of upper-outer quadrant of right female breast ([place]) 07/19/2014: Right breast cancer grade 2 IDC ER/PR negative HER2 positive Ki-67 90% treated with neoadjuvant chemo with TCHP x6 cycles followed by Herceptin maintenance, right lumpectomy had residual disease 4 cm 1/3 lymph node positive, ER/PR negative, adjuvant radiation and capecitabine completed 09/27/2015 12/06/2015: Recurrence: Right subpectoral lymph node and subcutaneous nodule, liver mass (biopsy adenocarcinoma consistent with upper GI or pancreaticobiliary primary, HER2 negative, ER  negative) July 2019: Metastatic disease: TDM 1 followed by Herceptin maintenance, pertuzumab added 10/06/2018, Abraxane 12/31/2017-09/01/2018, Herceptin and pertuzumab maintenance   Prior treatment: Enhertu started 10/05/2020 switched to Herceptin maintenance 02/19/2021 switched to Enhertu at higher dosage x 3 cycles discontinued 08/01/2021    02/04/2021: PET CT scan: Complete metabolic response, no evidence of hypermetabolic disease in the liver (mass measured 2.7 cm previously is now 1.5 cm with no uptake)    08/20/2021: CT CAP: Increased size of the metastatic liver lesions went from 6 cm to 7.5 cm and hepatic dome lesion is now 17 mm previously 10 mm, stable size of the right subpectoral lymph node, no new sites of metastatic disease, stable upper abdominal lymph nodes probably benign  ------------------------------------------------------------------------------------------------------------------------------------------- Current treatment: Completed 3 cycles of Trodelvy.  Because of progression we are discontinuing it. Liver biopsy was performed 08/30/2021: Poorly differentiated carcinoma consistent with metastatic breast cancer.  ER negative, PR negative, GCDFP negative, HER2 1+ Molecular testing: NTRK 1: Amplified, genomic LOH: High, TMB: 4 MSI stable (PIK 3 CA: Not detected, BRCA not detected)    CT CAP 10/31/2021: Progression of right lobe of the liver lesion 8.3 cm (was 7.5 cm) another lesion which was 1 cm and 1.6 cm.  There is no other evidence of progression of disease. Because of the progression I recommended switching treatment to Xeloda.  Xeloda counseling: I discussed the risks and benefits of Xeloda including the risk of hand-foot syndrome and diarrhea as well as cytopenias and liver function abnormalities and fatigue.  She will be prescribed 1500 mg p.o. twice daily 2 weeks on 1 week off.  Return  to clinic in 2 weeks for follow-up with Jenny Reichmann.    No orders of the defined types were  placed in this encounter.  The patient has a good understanding of the overall plan. she agrees with it. she will call with any problems that may develop before the next visit here. Total time spent: 30 mins including face to face time and time spent for planning, charting and co-ordination of care   Harriette Ohara, MD 11/02/21    I Gardiner Coins am scribing for Dr. Lindi Adie  I have reviewed the above documentation for accuracy and completeness, and I agree with the above.

## 2021-10-31 ENCOUNTER — Ambulatory Visit (HOSPITAL_COMMUNITY)
Admission: RE | Admit: 2021-10-31 | Discharge: 2021-10-31 | Disposition: A | Discharge status: Home / Self Care | Payer: Medicare PPO | Source: Ambulatory Visit | Attending: Hematology and Oncology | Admitting: Hematology and Oncology

## 2021-10-31 DIAGNOSIS — K769 Liver disease, unspecified: Secondary | ICD-10-CM | POA: Diagnosis not present

## 2021-10-31 DIAGNOSIS — J929 Pleural plaque without asbestos: Secondary | ICD-10-CM | POA: Diagnosis not present

## 2021-10-31 DIAGNOSIS — C50411 Malignant neoplasm of upper-outer quadrant of right female breast: Secondary | ICD-10-CM | POA: Insufficient documentation

## 2021-10-31 DIAGNOSIS — Z171 Estrogen receptor negative status [ER-]: Secondary | ICD-10-CM | POA: Insufficient documentation

## 2021-10-31 DIAGNOSIS — C787 Secondary malignant neoplasm of liver and intrahepatic bile duct: Secondary | ICD-10-CM | POA: Diagnosis not present

## 2021-10-31 DIAGNOSIS — N200 Calculus of kidney: Secondary | ICD-10-CM | POA: Diagnosis not present

## 2021-10-31 DIAGNOSIS — I700 Atherosclerosis of aorta: Secondary | ICD-10-CM | POA: Diagnosis not present

## 2021-10-31 DIAGNOSIS — K8020 Calculus of gallbladder without cholecystitis without obstruction: Secondary | ICD-10-CM | POA: Diagnosis not present

## 2021-10-31 DIAGNOSIS — D259 Leiomyoma of uterus, unspecified: Secondary | ICD-10-CM | POA: Diagnosis not present

## 2021-11-01 ENCOUNTER — Encounter (INDEPENDENT_AMBULATORY_CARE_PROVIDER_SITE_OTHER): Payer: Self-pay | Admitting: Family Medicine

## 2021-11-01 ENCOUNTER — Other Ambulatory Visit (INDEPENDENT_AMBULATORY_CARE_PROVIDER_SITE_OTHER): Payer: Self-pay | Admitting: Cardiology

## 2021-11-01 ENCOUNTER — Ambulatory Visit (INDEPENDENT_AMBULATORY_CARE_PROVIDER_SITE_OTHER): Payer: Medicare PPO | Admitting: Family Medicine

## 2021-11-01 VITALS — BP 145/69 | HR 50 | Temp 98.0°F | Ht 69.0 in | Wt 176.0 lb

## 2021-11-01 DIAGNOSIS — E669 Obesity, unspecified: Secondary | ICD-10-CM

## 2021-11-01 DIAGNOSIS — F439 Reaction to severe stress, unspecified: Secondary | ICD-10-CM

## 2021-11-01 DIAGNOSIS — E1169 Type 2 diabetes mellitus with other specified complication: Secondary | ICD-10-CM | POA: Diagnosis not present

## 2021-11-01 DIAGNOSIS — Z794 Long term (current) use of insulin: Secondary | ICD-10-CM | POA: Diagnosis not present

## 2021-11-01 DIAGNOSIS — Z6826 Body mass index (BMI) 26.0-26.9, adult: Secondary | ICD-10-CM | POA: Diagnosis not present

## 2021-11-01 DIAGNOSIS — E119 Type 2 diabetes mellitus without complications: Secondary | ICD-10-CM | POA: Insufficient documentation

## 2021-11-01 MED FILL — Dexamethasone Sodium Phosphate Inj 100 MG/10ML: INTRAMUSCULAR | Qty: 1 | Status: AC

## 2021-11-01 MED FILL — Fosaprepitant Dimeglumine For IV Infusion 150 MG (Base Eq): INTRAVENOUS | Qty: 5 | Status: AC

## 2021-11-02 ENCOUNTER — Telehealth: Payer: Self-pay

## 2021-11-02 ENCOUNTER — Other Ambulatory Visit (HOSPITAL_COMMUNITY): Payer: Self-pay | Admitting: *Deleted

## 2021-11-02 ENCOUNTER — Encounter: Payer: Self-pay | Admitting: Hematology and Oncology

## 2021-11-02 ENCOUNTER — Inpatient Hospital Stay: Payer: Medicare PPO

## 2021-11-02 ENCOUNTER — Other Ambulatory Visit (HOSPITAL_COMMUNITY): Payer: Self-pay

## 2021-11-02 ENCOUNTER — Other Ambulatory Visit: Payer: Self-pay

## 2021-11-02 ENCOUNTER — Inpatient Hospital Stay: Payer: Medicare PPO | Attending: Oncology | Admitting: Hematology and Oncology

## 2021-11-02 ENCOUNTER — Telehealth: Payer: Self-pay | Admitting: Pharmacy Technician

## 2021-11-02 DIAGNOSIS — C50411 Malignant neoplasm of upper-outer quadrant of right female breast: Secondary | ICD-10-CM

## 2021-11-02 DIAGNOSIS — Z171 Estrogen receptor negative status [ER-]: Secondary | ICD-10-CM

## 2021-11-02 DIAGNOSIS — C50811 Malignant neoplasm of overlapping sites of right female breast: Secondary | ICD-10-CM | POA: Diagnosis present

## 2021-11-02 DIAGNOSIS — C787 Secondary malignant neoplasm of liver and intrahepatic bile duct: Secondary | ICD-10-CM | POA: Diagnosis not present

## 2021-11-02 DIAGNOSIS — Z23 Encounter for immunization: Secondary | ICD-10-CM | POA: Insufficient documentation

## 2021-11-02 LAB — CMP (CANCER CENTER ONLY)
ALT: 12 U/L (ref 0–44)
AST: 22 U/L (ref 15–41)
Albumin: 3.8 g/dL (ref 3.5–5.0)
Alkaline Phosphatase: 54 U/L (ref 38–126)
Anion gap: 3 — ABNORMAL LOW (ref 5–15)
BUN: 36 mg/dL — ABNORMAL HIGH (ref 8–23)
CO2: 29 mmol/L (ref 22–32)
Calcium: 8.9 mg/dL (ref 8.9–10.3)
Chloride: 109 mmol/L (ref 98–111)
Creatinine: 1.71 mg/dL — ABNORMAL HIGH (ref 0.44–1.00)
GFR, Estimated: 32 mL/min — ABNORMAL LOW (ref >60)
Glucose, Bld: 126 mg/dL — ABNORMAL HIGH (ref 70–99)
Potassium: 4.2 mmol/L (ref 3.5–5.1)
Sodium: 141 mmol/L (ref 135–145)
Total Bilirubin: 0.4 mg/dL (ref 0.3–1.2)
Total Protein: 6.1 g/dL — ABNORMAL LOW (ref 6.5–8.1)

## 2021-11-02 LAB — CBC WITH DIFFERENTIAL (CANCER CENTER ONLY)
Abs Immature Granulocytes: 0.01 10*3/uL (ref 0.00–0.07)
Basophils Absolute: 0 10*3/uL (ref 0.0–0.1)
Basophils Relative: 1 %
Eosinophils Absolute: 0.2 10*3/uL (ref 0.0–0.5)
Eosinophils Relative: 4 %
HCT: 28.8 % — ABNORMAL LOW (ref 36.0–46.0)
Hemoglobin: 9.8 g/dL — ABNORMAL LOW (ref 12.0–15.0)
Immature Granulocytes: 0 %
Lymphocytes Relative: 20 %
Lymphs Abs: 0.8 10*3/uL (ref 0.7–4.0)
MCH: 33.8 pg (ref 26.0–34.0)
MCHC: 34 g/dL (ref 30.0–36.0)
MCV: 99.3 fL (ref 80.0–100.0)
Monocytes Absolute: 0.5 10*3/uL (ref 0.1–1.0)
Monocytes Relative: 12 %
Neutro Abs: 2.6 10*3/uL (ref 1.7–7.7)
Neutrophils Relative %: 63 %
Platelet Count: 208 10*3/uL (ref 150–400)
RBC: 2.9 MIL/uL — ABNORMAL LOW (ref 3.87–5.11)
RDW: 13.4 % (ref 11.5–15.5)
WBC Count: 4.1 10*3/uL (ref 4.0–10.5)
nRBC: 0 % (ref 0.0–0.2)

## 2021-11-02 LAB — PHOSPHORUS: Phosphorus: 4 mg/dL (ref 2.5–4.6)

## 2021-11-02 LAB — CEA (IN HOUSE-CHCC): CEA (CHCC-In House): 996.15 ng/mL — ABNORMAL HIGH (ref 0.00–5.00)

## 2021-11-02 LAB — MAGNESIUM: Magnesium: 1.9 mg/dL (ref 1.7–2.4)

## 2021-11-02 MED ORDER — CAPECITABINE 500 MG PO TABS
1500.0000 mg | ORAL_TABLET | Freq: Two times a day (BID) | ORAL | 3 refills | Status: DC
  Filled 2021-11-02: qty 84, 14d supply, fill #0

## 2021-11-02 MED ORDER — CARVEDILOL 6.25 MG PO TABS: 6.2500 mg | ORAL_TABLET | Freq: Two times a day (BID) | ORAL | 3 refills | Status: DC

## 2021-11-02 NOTE — Telephone Encounter (Signed)
Oral Oncology Patient Advocate Encounter  Prior Authorization for Capecitabine has been approved.    PA# [id number] Effective dates: 11/02/2021 through 01/21/2023  Patients co-pay is $42.    Lady Deutscher, CPhT-Adv Oncology Pharmacy Patient [place] Direct Number: [phone]  Fax: [phone]

## 2021-11-02 NOTE — Assessment & Plan Note (Addendum)
07/19/2014: Right breast cancer grade 2 IDC ER/PR negative HER2 positive Ki-67 90% treated with neoadjuvant chemo with TCHP x6 cycles followed by Herceptin maintenance, right lumpectomy had residual disease 4 cm 1/3 lymph node positive, ER/PR negative, adjuvant radiation and capecitabine completed 09/27/2015 12/06/2015: Recurrence: Right subpectoral lymph node and subcutaneous nodule, liver mass (biopsy adenocarcinoma consistent with upper GI or pancreaticobiliary primary, HER2 negative, ER negative) July 2019: Metastatic disease: TDM 1 followed by Herceptin maintenance,pertuzumabadded 10/06/2018, Abraxane 12/31/2017-09/01/2018, Herceptin and pertuzumab maintenance  Priortreatment: Enhertu started 9/15/2022switched to Herceptin maintenance 1/30/2023switched to Enhertu at higher dosage x 3 cycles discontinued 08/01/2021   02/04/2021: PET CT scan: Complete metabolic response, no evidence of hypermetabolic disease in the liver (mass measured 2.7 cm previously is now 1.5 cm with no uptake)  08/20/2021: CT CAP: Increased size of the metastatic liver lesions went from 6 cm to 7.5 cm and hepatic dome lesion is now 17 mm previously 10 mm, stable size of the right subpectoral lymph node, no new sites of metastatic disease, stable upper abdominal lymph nodes probably benign ------------------------------------------------------------------------------------------------------------------------------------------- Current treatment: Trodelvy cycle4 day 1 Liver biopsy was performed 08/30/2021: Poorly differentiated carcinoma consistent with metastatic breast cancer. ER negative, PR negative, GCDFP negative, HER2 1+ Molecular testing:NTRK 1: Amplified, genomic LOH: High, TMB: 4 MSI stable (PIK 3 CA: Not detected, BRCA not detected)   CT CAP 10/31/2021: Progression of right lobe of the liver lesion 8.3 cm (was 7.5 cm) another lesion which was 1 cm and 1.6 cm.  There is no other evidence of progression of  disease. Because of the progression I recommended switching treatment to Xeloda.  Xeloda counseling: I discussed the risks and benefits of Xeloda including the risk of hand-foot syndrome and diarrhea as well as cytopenias and liver function abnormalities and fatigue.  She will be prescribed 1500 mg p.o. twice daily 2 weeks on 1 week off.  Return to clinic in 2 weeks for follow-up with John. 

## 2021-11-02 NOTE — Telephone Encounter (Signed)
Oral Oncology Patient Advocate Encounter   Received notification that prior authorization for Capecitabine is required.   PA submitted on 11/02/2021 Key [id number] Status is pending     Lady Deutscher, CPhT-Adv Oncology Pharmacy Patient [place] Direct Number: [phone]  Fax: [phone]

## 2021-11-02 NOTE — Telephone Encounter (Signed)
Oral Oncology Pharmacist Encounter  Received new prescription for capecitabine (Xeloda) for the treatment of metastatic breast cancer, planned duration until disease progression or unacceptable toxicity.  Labs from 11/02/21 assessed, Creatinine elevated to 1.71 resulting in CrCl of 41.03 ml/min. Due to CrCl being between 30-11m/min dose should be reduced to 75% of daily dose. Dose is already reduced based on BSA of 2.047mto 3 tablets daily (from 2,'000mg'$  BID to 1,'500mg'$  BID).   Current medication list in Epic reviewed, DDIs with Xeloda identified: - omeprazole: may decrease efficacy of Xeloda  Evaluated chart and no patient barriers to medication adherence noted.   Patient agreement for treatment documented in MD note on 11/02/21.  [facility]  Oral Oncology Clinic will continue to follow for insurance authorization, copayment issues, initial counseling and start date.  KaDrema HalonPharmD Hematology/Oncology Clinical Pharmacist [phone]/13/2023 10:46 AM

## 2021-11-05 ENCOUNTER — Encounter: Payer: Self-pay | Admitting: Hematology and Oncology

## 2021-11-05 DIAGNOSIS — Z1231 Encounter for screening mammogram for malignant neoplasm of breast: Secondary | ICD-10-CM | POA: Diagnosis not present

## 2021-11-05 LAB — HM MAMMOGRAPHY

## 2021-11-06 ENCOUNTER — Other Ambulatory Visit (HOSPITAL_COMMUNITY): Payer: Self-pay

## 2021-11-06 DIAGNOSIS — R269 Unspecified abnormalities of gait and mobility: Secondary | ICD-10-CM | POA: Diagnosis not present

## 2021-11-06 DIAGNOSIS — F339 Major depressive disorder, recurrent, unspecified: Secondary | ICD-10-CM | POA: Diagnosis not present

## 2021-11-06 DIAGNOSIS — M25572 Pain in left ankle and joints of left foot: Secondary | ICD-10-CM | POA: Diagnosis not present

## 2021-11-06 DIAGNOSIS — Z9181 History of falling: Secondary | ICD-10-CM | POA: Diagnosis not present

## 2021-11-06 DIAGNOSIS — M6259 Muscle wasting and atrophy, not elsewhere classified, multiple sites: Secondary | ICD-10-CM | POA: Diagnosis not present

## 2021-11-06 DIAGNOSIS — E118 Type 2 diabetes mellitus with unspecified complications: Secondary | ICD-10-CM | POA: Diagnosis not present

## 2021-11-06 DIAGNOSIS — M25671 Stiffness of right ankle, not elsewhere classified: Secondary | ICD-10-CM | POA: Diagnosis not present

## 2021-11-06 DIAGNOSIS — I10 Essential (primary) hypertension: Secondary | ICD-10-CM | POA: Diagnosis not present

## 2021-11-06 MED ORDER — CAPECITABINE 500 MG PO TABS
1500.0000 mg | ORAL_TABLET | Freq: Two times a day (BID) | ORAL | 3 refills | Status: DC
  Filled 2021-11-06: qty 84, 21d supply, fill #0
  Filled 2021-11-06: qty 84, 14d supply, fill #0
  Filled 2021-11-20: qty 84, 21d supply, fill #1

## 2021-11-06 NOTE — Telephone Encounter (Addendum)
Oral Chemotherapy Pharmacist Encounter  I spoke with patient for overview of: Xeloda (capecitabine) for the treatment of metastatic breast cancer, planned duration until disease progression or unacceptable drug toxicity.  Counseled patient on administration, dosing, side effects, monitoring, drug-food interactions, safe handling, storage, and disposal.  Patient will take Xeloda '500mg'$  tablets, 3 tablets ('1500mg'$ ) by mouth in AM and 3 tabs ('1500mg'$ ) by mouth in PM, within 30 minutes of finishing meals, for 14 days on, 7 days off, repeated every 21 days.  Xeloda start date: 11/12/21  Adverse effects include but are not limited to: fatigue, decreased blood counts, GI upset, diarrhea, mouth sores, and hand-foot syndrome.  Patient has anti-emetic on hand and knows to take it if nausea develops.   Patient will obtain anti diarrheal and alert the office of 4 or more loose stools above baseline. Patient will pick up udderly smooth extra care 20 lotion and apply 2-3 times per day while on Xeloda.  Patient is not taking the omeprazole and knows to avoid while on therapy.   Reviewed with patient importance of keeping a medication schedule and plan for any missed doses. No barriers to medication adherence identified.  Medication reconciliation performed and medication/allergy list updated.  This will ship from the [facility] on 11/06/21 to deliver to patient's home on 11/07/21.  Patient informed the pharmacy will reach out 5-7 days prior to needing next fill of Xeloda to coordinate continued medication acquisition to prevent break in therapy.  All questions answered.  Mrs. Blok voiced understanding and appreciation.   Medication education handout placed in mail for patient. Patient knows to call the office with questions or concerns. Oral Chemotherapy Clinic phone number provided to patient.   Drema Halon, PharmD Hematology/Oncology Clinical Pharmacist [facility]  [facility] [phone]

## 2021-11-06 NOTE — Progress Notes (Signed)
Chief Complaint:   OBESITY Darlene [name] is here to discuss her progress with her obesity treatment plan along with follow-up of her obesity related diagnoses. Darlene [name] is on keeping a food journal and adhering to recommended goals of 1800 calories and 100 grams of protein daily and states she is following her eating plan approximately 50% of the time. Darlene [name] states she is doing physical therapy for 60 minutes 1 time per week.  Today's visit was #: 13 Starting weight: 225 lbs Starting date: 08/09/2016 Today's weight: 176 lbs Today's date: 11/01/2021 Total lbs lost to date: 49 Total lbs lost since last in-office visit: 0  Interim History: Darlene [name] notes increased comfort eating. Her cancer is active again and her stress has increased. She recently stopped drinking [employer] frappachino. She is doing well with eating breakfast.   Subjective:   1. Type 2 diabetes mellitus with other specified complication, with long-term current use of insulin (Darlene [name]) Darlene [name] is not checking her glucose levels as much but she was at 99 this morning. She has increased her simple carbohydrates but she is working on getting this under better control.   2. Stress Darlene [name] stress level has increased with her recent cancer recurrence. She has support, but she is trying to not over burden them. She is concerned that this may not go into remission. She notes decrease in sleep and increased comfort eating.   Assessment/Plan:   1. Type 2 diabetes mellitus with other specified complication, with long-term current use of insulin (HCC) Darlene [name] was encouraged to make sure she is eating more protein and vegetables so she will be able to portion control her carbohydrates better.   2. Stress Darlene [name] was encouraged to contact her Oncologist office to see what kind of therapy options are available to her to help her navigate dealing with her disease. She was offered support and encouragement.   3. Obesity, Current  BMI 26.0 Darlene [name] is currently in the action stage of change. As such, her goal is to continue with weight loss efforts. She has agreed to keeping a food journal and adhering to recommended goals of 1800 calories and 100 grams of protein daily.   Exercise goals: As is.   Behavioral modification strategies: increasing lean protein intake, no skipping meals, and emotional eating strategies.  Darlene [name] has agreed to follow-up with our clinic in 8 weeks. She was informed of the importance of frequent follow-up visits to maximize her success with intensive lifestyle modifications for her multiple health conditions.   Objective:   Blood pressure (!) 145/69, pulse (!) 50, temperature 98 F (36.7 C), height '5\' 9"'$  (1.753 m), weight 176 lb (79.8 kg), last menstrual period 10/22/2007, SpO2 96 %. Body mass index is 25.99 kg/m.  General: Cooperative, alert, well developed, in no acute distress. HEENT: Conjunctivae and lids unremarkable. Cardiovascular: Regular rhythm.  Lungs: Normal work of breathing. Neurologic: No focal deficits.   Lab Results  Component Value Date   CREATININE 1.71 (H) 11/02/2021   BUN 36 (H) 11/02/2021   NA 141 11/02/2021   K 4.2 11/02/2021   CL 109 11/02/2021   CO2 29 11/02/2021   Lab Results  Component Value Date   ALT 12 11/02/2021   AST 22 11/02/2021   ALKPHOS 54 11/02/2021   BILITOT 0.4 11/02/2021   Lab Results  Component Value Date   HGBA1C 5.9 (A) 05/23/2021   HGBA1C 6.3 (A) 11/23/2020   HGBA1C 5.3 07/04/2020   HGBA1C 5.2 01/04/2020   HGBA1C 5.4 10/05/2019  Lab Results  Component Value Date   INSULIN 7.6 01/25/2019   INSULIN 15.3 03/11/2017   INSULIN 18.3 08/09/2016   Lab Results  Component Value Date   TSH 1.45 04/11/2021   Lab Results  Component Value Date   CHOL 94 10/10/2020   HDL 55 10/10/2020   LDLCALC 26 10/10/2020   TRIG 63 10/10/2020   CHOLHDL 1.7 10/10/2020   Lab Results  Component Value Date   VD25OH 49.3 01/04/2020   VD25OH  43.6 10/05/2019   VD25OH 43.5 08/27/2018   Lab Results  Component Value Date   WBC 4.1 11/02/2021   HGB 9.8 (L) 11/02/2021   HCT 28.8 (L) 11/02/2021   MCV 99.3 11/02/2021   PLT 208 11/02/2021   Lab Results  Component Value Date   IRON 58 02/18/2013   TIBC 305 02/18/2013   FERRITIN 133 10/31/2014   Attestation Statements:   Reviewed by clinician on day of visit: allergies, medications, problem list, medical history, surgical history, family history, social history, and previous encounter notes.  Time spent on visit including pre-visit chart review and post-visit care and charting was 45 minutes.   I, [name], am acting as transcriptionist for Dennard Nip, MD.  I have reviewed the above documentation for accuracy and completeness, and I agree with the above. -  Dennard Nip, MD

## 2021-11-06 NOTE — Progress Notes (Unsigned)
Follow-up Visit   Date: 11/07/21   Darlene [name] MRN: [id number] DOB: 1950/06/11   Interim History: Darlene [name] is a 71 y.o. right-handed Caucasian female with insulin-dependent diabetes mellitus, gastric bypass (2015 weight 130lb weight loss), right breast cancer s/p mastectomy, chemotherapy, and radiation (2016) with liver metastasis in 2017, and hypertension  returning to the clinic for follow-up of neuropathy and bilateral foot drop.  The patient was accompanied to the clinic by self.  History of present illness: She has a long history of diabetic neuropathy since around 2010 manifesting with numbness in the feet and lower legs.  She denies painful paresthesias.  She does have imbalance and walks unassisted, although a cane has been recommended.   She is avid Hospital doctor and takes a hiking stick when she is walking.  No falls.  She lives in a one-level home.  Initially, her diabetes was poorly-controlled with HbA1c ranging in 10-12, however, after her gastric bypass in 2015, she has markedly improved diabetes management.  Her last HbA1c is 5.5 on insulin and metformin.  She lost 130lb with her surgery.  She takes daily multievitamin and B12 injection every 2 weeks. B12 level from August was normal.  Her neuropathy did get worse in 2017 when she received chemotherapy for breast cancer (carboplatin, docetaxel, trastuzumab and pertuzumab given every 3 weeks 6. Trastuzumab + capecitabine). No history of heavy alcohol use.     She developed left foot drop in 2019 which improved with physical therapy.  Further testing was not done.  She underwent right bunionectomy on 12/09/2018.  She was immobilized in a boot for several weeks and did not notice any weakness.  Starting in January 2021, she was cleared to start to wear sneakers and she felt that both feet were weak.  She went to physical therapy and noticed that she has bilateral foot drop, with the left foot asymmetrically  weaker.   NCS/EMG of the legs which showed severe sensorimotor polyneuropathy with superimposed bilateral peroneal mononeuropathy at the fibular head. She was doing PT and got a brace, which has helped stabilize her foot.  She has noticed mild improvement, such that she is able to extend the left foot, which was previously completely weak.    UPDATE 12/01/2020:  She is here for follow-up visit.  She has recurrence of breast cancer and is undergoing chemotherapy again. Mood is doing well. She has not noticed any worsening of her neuropathy with chemotherapy. She has not had any falls, but reports having one trip. She is very compliant with using her braces and been better with using her cane. She continues to have intermittent muscle cramps and has noticed certain foot positions tends to trigger it.    UPDATE 11/07/2021:  She is here for follow-up visit. Her liver metastasis is larger and there a few other areas they are monitoring, despite being on three chemotherapy regimens.  She will be started Xeloda next week. Bilateral foot drop is unchanged.  Balance is ok and she continues to go once per week to PT. She had one fall.  She uses a cane most of the time. She denies any new numbness/tingling or weakness.  No worsening of neuropathy with chemotherapy. Mood is good, despite all she is managing. She no longer has muscle cramps.  Medications:  Current Outpatient Medications on File Prior to Visit  Medication Sig Dispense Refill   Accu-Chek Softclix Lancets lancets 1 each by Other route 4 (four) times daily. Test blood  sugars 4 x times daily 200 each 1   aspirin EC 81 MG tablet Take 1 tablet (81 mg total) by mouth daily. 90 tablet 3   Bempedoic Acid-Ezetimibe (NEXLIZET) 180-10 MG TABS Take 1 tablet by mouth daily. 90 tablet 3   Blood Glucose Calibration (ACCU-CHEK GUIDE CONTROL) LIQD 1 Bottle by In Vitro route as needed. 1 each 0   Blood Glucose Monitoring Suppl (ACCU-CHEK GUIDE) w/Device KIT 1 each by  Does not apply route 4 (four) times daily. Use to test blood sugars 4x a day 1 kit 0   buPROPion (WELLBUTRIN SR) 200 MG 12 hr tablet Take one tablet by mouth once daily. 90 tablet 0   CALCIUM CITRATE-VITAMIN D PO Take 1 tablet by mouth 3 (three) times daily.      capecitabine (XELODA) 500 MG tablet Take 3 tablets (1,500 mg total) by mouth 2 (two) times daily after a meal. Take for 14 days on, 7 days off. Repeat every 21 days. 84 tablet 3   carvedilol (COREG) 6.25 MG tablet Take 1 tablet (6.25 mg total) by mouth 2 (two) times daily. 180 tablet 3   cholecalciferol (VITAMIN D) 1000 units tablet Take 1,000 Units by mouth daily.     Cyanocobalamin (VITAMIN B-12 PO) Take 1 tablet by mouth every 14 (fourteen) days.     diphenhydrAMINE (BENADRYL) 25 MG tablet Take 25 mg by mouth daily as needed for allergies or sleep.     glucose blood (ACCU-CHEK GUIDE) test strip Use Accu Chek test strips to check blood sugar 4 times daily. DX:E11.21 400 each 0   insulin aspart (NOVOLOG) 100 UNIT/ML injection Inject into the skin daily as needed for high blood sugar. Per patient, sliding scale with meals as needed     insulin glargine (LANTUS SOLOSTAR) 100 UNIT/ML Solostar Pen Inject 8 Units into the skin daily. 7 mL 3   KERENDIA 20 MG TABS Take 1 tablet by mouth daily. 30 tablet 6   Lactobacillus ([employer] ACIDOPHILUS PROBIOTIC) 0.5 MG TABS Take 1 tablet by mouth daily.     loratadine (CLARITIN) 10 MG tablet Take 10 mg by mouth as needed for allergies.     melatonin 5 MG TABS Take 5 mg by mouth at bedtime.     Pediatric Multivitamins-Iron (FLINTSTONES PLUS IRON) chewable tablet Chew 1 tablet by mouth 2 (two) times daily.     polyethylene glycol (MIRALAX / GLYCOLAX) 17 g packet Take 17 g by mouth daily.     prochlorperazine (COMPAZINE) 10 MG tablet Take 1 tablet (10 mg total) by mouth every 6 (six) hours as needed (Nausea or vomiting). 30 tablet 6   Propylene Glycol (SYSTANE BALANCE OP) Place 1-2 drops into both eyes 3 (three)  times daily as needed (for dry eyes).      SURE COMFORT PEN NEEDLES 32G X 4 MM MISC Use as instructed to inject insulin and Victoza daily. 200 each 0   tobramycin (TOBREX) 0.3 % ophthalmic ointment Place 1 application into both eyes as needed (Takes day before eye injections).     vortioxetine HBr (TRINTELLIX) 20 MG TABS tablet Take 1 tablet (20 mg total) by mouth at bedtime. 90 tablet 0   Current Facility-Administered Medications on File Prior to Visit  Medication Dose Route Frequency Provider Last Rate Last Admin   acetaminophen (TYLENOL) 325 MG tablet            diphenhydrAMINE (BENADRYL) 50 MG/ML injection            famotidine (  PEPCID) 20-0.9 MG/50ML-% IVPB            palonosetron (ALOXI) 0.25 MG/5ML injection             Allergies:  Allergies  Allergen Reactions   Antihistamines, Diphenhydramine-Type Other (See Comments)    IV diphenhydramine causes sedation.   Nsaids Other (See Comments)    H/o gastric bypass - avoid NSAIDs   Tape Hives, Rash and Other (See Comments)    Adhesives in bandaids    Vital Signs:  BP (!) 152/65 Comment: stressed with parking situation  Pulse 62   Ht '5\' 9"'  (1.753 m)   Wt 183 lb (83 kg)   LMP 10/22/2007   SpO2 99%   BMI 27.02 kg/m     Neurological Exam: MENTAL STATUS including orientation to time, place, person, recent and remote memory, attention span and concentration, language, and fund of knowledge is normal.  Speech is not dysarthric.  CRANIAL NERVES: Pupils are round and reactive to light.  Normal conjugate, extra-ocular eye movements in all directions of gaze.  No ptosis.  Face is symmetric.   MOTOR:  Mild bilateral TA atrophy, no fasciculations or abnormal movements.  No pronator drift.   Upper Extremity:  Right  Left  Deltoid  5/5   5/5   Biceps  5/5   5/5   Triceps  5/5   5/5   Infraspinatus 5/5  5/5  Medial pectoralis 5/5  5/5  Wrist extensors  5/5   5/5   Wrist flexors  5/5   5/5   Finger extensors  5/5   5/5   Finger  flexors  5/5   5/5   Dorsal interossei  5/5   5/5   Abductor pollicis  5/5   5/5   Tone (Ashworth scale)  0  0   Lower Extremity:  Right  Left  Hip flexors  5/5   5/5   Hip extensors  5/5   5/5   Adductor 5/5  5/5  Abductor 5/5  5/5  Knee flexors  5/5   5/5   Knee extensors  5/5   5/5   Dorsiflexors  4+/5   4/5   Plantarflexors  5/5   5/5   Toe extensors  5/5   5/5   Toe flexors  5/5   5/5   Tone (Ashworth scale)  0  0    COORDINATION/GAIT:  Stable gait, assisted with cane and bilateral ankle brace   Data: NCS/EMG of the legs 03/03/19: The electrophysiologic findings are consistent with a chronic, severe, sensorimotor axonal polyneuropathy affecting the lower extremities. There is evidence of a superimposed common peroneal neuropathy at the fibular neck affecting bilateral lower extremities, findings are severe in degree electrically and worse on the left.  MRI brain wwo contrast 04/27/2019: No evidence of regional metastatic disease. Moderate chronic small-vessel changes of the pons and cerebral hemispheric white matter.  MRI lumbar spine 02/21/2020: 1. Advanced degenerative disc disease at L4-5 with resultant moderate left greater than right lateral recess stenosis, with moderate left and mild right L4 foraminal narrowing. 2. Small right foraminal to extraforaminal disc protrusion at L3-4, closely approximating and potentially irritating the exiting right L3 nerve root. 3. No evidence for metastatic disease within the lumbar spine.   IMPRESSION/PLAN: 1.  Muscle cramps secondary to lumbar spondylosis and metabolic changes (CKD).    - Improved with stretching  2.  Sensorimotor polyneuropathy due to diabetes and chemotherapy manifesting with numbness and ataxia, stable  - She  is compliant with using a cane  3.   Bilateral foot drop due to peroneal mononeuropathy at the fibular head, stable, worse on the left (2019)  - Continue to use bilateral AFO  - Continue out-patient PT    Return to clinic in 1 year   Thank you for allowing me to participate in patient's care.  If I can answer any additional questions, I would be pleased to do so.    Sincerely,    Adrine Hayworth K. Posey Pronto, DO

## 2021-11-07 ENCOUNTER — Encounter: Payer: Self-pay | Admitting: Neurology

## 2021-11-07 ENCOUNTER — Telehealth: Payer: Medicare PPO | Admitting: Neurology

## 2021-11-07 ENCOUNTER — Encounter: Payer: Self-pay | Admitting: Hematology and Oncology

## 2021-11-07 ENCOUNTER — Encounter: Payer: Self-pay | Admitting: Internal Medicine

## 2021-11-07 ENCOUNTER — Telehealth: Payer: Self-pay | Admitting: Hematology and Oncology

## 2021-11-07 VITALS — BP 152/65 | HR 62 | Ht 69.0 in | Wt 183.0 lb

## 2021-11-07 DIAGNOSIS — G620 Drug-induced polyneuropathy: Secondary | ICD-10-CM

## 2021-11-07 DIAGNOSIS — M21371 Foot drop, right foot: Secondary | ICD-10-CM

## 2021-11-07 DIAGNOSIS — T451X5A Adverse effect of antineoplastic and immunosuppressive drugs, initial encounter: Secondary | ICD-10-CM

## 2021-11-07 DIAGNOSIS — M21372 Foot drop, left foot: Secondary | ICD-10-CM | POA: Diagnosis not present

## 2021-11-07 NOTE — Patient Instructions (Signed)
It was great to see you today!  I will see you back in 1 year

## 2021-11-07 NOTE — Telephone Encounter (Signed)
Scheduled appointment per 10/13 los. Patient is aware.

## 2021-11-08 ENCOUNTER — Encounter: Payer: Self-pay | Admitting: Hematology and Oncology

## 2021-11-08 ENCOUNTER — Other Ambulatory Visit: Payer: Self-pay

## 2021-11-08 ENCOUNTER — Other Ambulatory Visit (HOSPITAL_COMMUNITY): Payer: Self-pay

## 2021-11-09 ENCOUNTER — Ambulatory Visit: Payer: Medicare PPO

## 2021-11-09 ENCOUNTER — Ambulatory Visit: Payer: Medicare PPO | Admitting: Hematology and Oncology

## 2021-11-09 ENCOUNTER — Other Ambulatory Visit: Payer: Medicare PPO

## 2021-11-12 DIAGNOSIS — M25671 Stiffness of right ankle, not elsewhere classified: Secondary | ICD-10-CM | POA: Diagnosis not present

## 2021-11-12 DIAGNOSIS — F339 Major depressive disorder, recurrent, unspecified: Secondary | ICD-10-CM | POA: Diagnosis not present

## 2021-11-12 DIAGNOSIS — R269 Unspecified abnormalities of gait and mobility: Secondary | ICD-10-CM | POA: Diagnosis not present

## 2021-11-12 DIAGNOSIS — E118 Type 2 diabetes mellitus with unspecified complications: Secondary | ICD-10-CM | POA: Diagnosis not present

## 2021-11-12 DIAGNOSIS — Z9181 History of falling: Secondary | ICD-10-CM | POA: Diagnosis not present

## 2021-11-12 DIAGNOSIS — M25572 Pain in left ankle and joints of left foot: Secondary | ICD-10-CM | POA: Diagnosis not present

## 2021-11-12 DIAGNOSIS — M6259 Muscle wasting and atrophy, not elsewhere classified, multiple sites: Secondary | ICD-10-CM | POA: Diagnosis not present

## 2021-11-12 DIAGNOSIS — I10 Essential (primary) hypertension: Secondary | ICD-10-CM | POA: Diagnosis not present

## 2021-11-13 ENCOUNTER — Encounter: Payer: Self-pay | Admitting: Neurology

## 2021-11-14 DIAGNOSIS — H31093 Other chorioretinal scars, bilateral: Secondary | ICD-10-CM | POA: Diagnosis not present

## 2021-11-14 DIAGNOSIS — H3582 Retinal ischemia: Secondary | ICD-10-CM | POA: Diagnosis not present

## 2021-11-14 DIAGNOSIS — E113513 Type 2 diabetes mellitus with proliferative diabetic retinopathy with macular edema, bilateral: Secondary | ICD-10-CM | POA: Diagnosis not present

## 2021-11-19 ENCOUNTER — Other Ambulatory Visit: Payer: Self-pay

## 2021-11-19 ENCOUNTER — Inpatient Hospital Stay (HOSPITAL_BASED_OUTPATIENT_CLINIC_OR_DEPARTMENT_OTHER): Payer: Medicare PPO

## 2021-11-19 ENCOUNTER — Other Ambulatory Visit (HOSPITAL_COMMUNITY): Payer: Self-pay

## 2021-11-19 ENCOUNTER — Inpatient Hospital Stay: Payer: Medicare PPO | Admitting: Pharmacist

## 2021-11-19 VITALS — BP 137/65 | HR 51 | Temp 98.2°F | Resp 18 | Ht 69.0 in | Wt 182.8 lb

## 2021-11-19 DIAGNOSIS — C787 Secondary malignant neoplasm of liver and intrahepatic bile duct: Secondary | ICD-10-CM | POA: Diagnosis not present

## 2021-11-19 DIAGNOSIS — C50411 Malignant neoplasm of upper-outer quadrant of right female breast: Secondary | ICD-10-CM

## 2021-11-19 DIAGNOSIS — Z171 Estrogen receptor negative status [ER-]: Secondary | ICD-10-CM

## 2021-11-19 DIAGNOSIS — Z23 Encounter for immunization: Secondary | ICD-10-CM | POA: Diagnosis not present

## 2021-11-19 DIAGNOSIS — Z95828 Presence of other vascular implants and grafts: Secondary | ICD-10-CM

## 2021-11-19 LAB — CBC WITH DIFFERENTIAL (CANCER CENTER ONLY)
Abs Immature Granulocytes: 0.03 10*3/uL (ref 0.00–0.07)
Basophils Absolute: 0 10*3/uL (ref 0.0–0.1)
Basophils Relative: 0 %
Eosinophils Absolute: 0.2 10*3/uL (ref 0.0–0.5)
Eosinophils Relative: 3 %
HCT: 30 % — ABNORMAL LOW (ref 36.0–46.0)
Hemoglobin: 10.4 g/dL — ABNORMAL LOW (ref 12.0–15.0)
Immature Granulocytes: 1 %
Lymphocytes Relative: 12 %
Lymphs Abs: 0.7 10*3/uL (ref 0.7–4.0)
MCH: 34.1 pg — ABNORMAL HIGH (ref 26.0–34.0)
MCHC: 34.7 g/dL (ref 30.0–36.0)
MCV: 98.4 fL (ref 80.0–100.0)
Monocytes Absolute: 0.4 10*3/uL (ref 0.1–1.0)
Monocytes Relative: 7 %
Neutro Abs: 4.4 10*3/uL (ref 1.7–7.7)
Neutrophils Relative %: 77 %
Platelet Count: 168 10*3/uL (ref 150–400)
RBC: 3.05 MIL/uL — ABNORMAL LOW (ref 3.87–5.11)
RDW: 12.2 % (ref 11.5–15.5)
WBC Count: 5.7 10*3/uL (ref 4.0–10.5)
nRBC: 0 % (ref 0.0–0.2)

## 2021-11-19 LAB — CMP (CANCER CENTER ONLY)
ALT: 14 U/L (ref 0–44)
AST: 26 U/L (ref 15–41)
Albumin: 3.9 g/dL (ref 3.5–5.0)
Alkaline Phosphatase: 69 U/L (ref 38–126)
Anion gap: 6 (ref 5–15)
BUN: 62 mg/dL — ABNORMAL HIGH (ref 8–23)
CO2: 26 mmol/L (ref 22–32)
Calcium: 8.9 mg/dL (ref 8.9–10.3)
Chloride: 105 mmol/L (ref 98–111)
Creatinine: 1.98 mg/dL — ABNORMAL HIGH (ref 0.44–1.00)
GFR, Estimated: 27 mL/min — ABNORMAL LOW (ref >60)
Glucose, Bld: 270 mg/dL — ABNORMAL HIGH (ref 70–99)
Potassium: 4.4 mmol/L (ref 3.5–5.1)
Sodium: 137 mmol/L (ref 135–145)
Total Bilirubin: 0.5 mg/dL (ref 0.3–1.2)
Total Protein: 6.5 g/dL (ref 6.5–8.1)

## 2021-11-19 LAB — CEA (IN HOUSE-CHCC): CEA (CHCC-In House): 1260.9 ng/mL — ABNORMAL HIGH (ref 0.00–5.00)

## 2021-11-19 MED ORDER — SODIUM CHLORIDE 0.9% FLUSH
10.0000 mL | Freq: Once | INTRAVENOUS | Status: AC
  Administered 2021-11-19: 10 mL

## 2021-11-19 MED ORDER — INFLUENZA VAC A&B SA ADJ QUAD 0.5 ML IM PRSY
0.5000 mL | PREFILLED_SYRINGE | Freq: Once | INTRAMUSCULAR | Status: AC
  Administered 2021-11-19: 0.5 mL via INTRAMUSCULAR
  Filled 2021-11-19: qty 0.5

## 2021-11-19 MED ORDER — HEPARIN SOD (PORK) LOCK FLUSH 100 UNIT/ML IV SOLN
500.0000 [IU] | Freq: Once | INTRAVENOUS | Status: AC
  Administered 2021-11-19: 500 [IU]

## 2021-11-19 NOTE — Progress Notes (Deleted)
.  ja

## 2021-11-19 NOTE — Patient Instructions (Signed)
Influenza Vaccine Injection What is this medication? INFLUENZA VACCINE (in floo EN zuh vak SEEN) reduces the risk of the influenza (flu). It does not treat influenza. It is still possible to get influenza after receiving this vaccine, but the symptoms may be less severe or not last as long. It works by helping your immune system learn how to fight off a future infection. This medicine may be used for other purposes; ask your health care provider or pharmacist if you have questions. COMMON BRAND NAME(S): Afluria, Afluria Quadrivalent, Agriflu, Alfuria, FLUAD, FLUAD Quadrivalent, Fluarix, Fluarix Quadrivalent, Flublok, Flublok Quadrivalent, FLUCELVAX, FLUCELVAX Quadrivalent, Flulaval, Flulaval Quadrivalent, Fluvirin, Fluzone, Fluzone High-Dose, Fluzone Intradermal, Fluzone Quadrivalent What should I tell my care team before I take this medication? They need to know if you have any of these conditions: Bleeding disorder like hemophilia Fever or infection Guillain-Barre syndrome or other neurological problems Immune system problems Infection with the human immunodeficiency virus (HIV) or AIDS Low blood platelet counts Multiple sclerosis An unusual or allergic reaction to influenza virus vaccine, latex, other medications, foods, dyes, or preservatives. Different brands of vaccines contain different allergens. Some may contain latex or eggs. Talk to your care team about your allergies to make sure that you get the right vaccine. Pregnant or trying to get pregnant Breastfeeding How should I use this medication? This vaccine is injected into a muscle or under the skin. It is given by your care team. A copy of Vaccine Information Statements will be given before each vaccination. Be sure to read this sheet carefully each time. This sheet may change often. Talk to your care team to see which vaccines are right for you. Some vaccines should not be used in all age groups. Overdosage: If you think you have  taken too much of this medicine contact a poison control center or emergency room at once. NOTE: This medicine is only for you. Do not share this medicine with others. What if I miss a dose? This does not apply. What may interact with this medication? Certain medications that lower your immune system, such as etanercept, anakinra, infliximab, adalimumab Certain medications that prevent or treat blood clots, such as warfarin Chemotherapy or radiation therapy Phenytoin Steroid medications, such as prednisone or cortisone Theophylline Vaccines This list may not describe all possible interactions. Give your health care provider a list of all the medicines, herbs, non-prescription drugs, or dietary supplements you use. Also tell them if you smoke, drink alcohol, or use illegal drugs. Some items may interact with your medicine. What should I watch for while using this medication? Report any side effects that do not go away with your care team. Call your care team if any unusual symptoms occur within 6 weeks of receiving this vaccine. You may still catch the flu, but the illness is not usually as bad. You cannot get the flu from the vaccine. The vaccine will not protect against colds or other illnesses that may cause fever. The vaccine is needed every year. What side effects may I notice from receiving this medication? Side effects that you should report to your care team as soon as possible: Allergic reactions--skin rash, itching, hives, swelling of the face, lips, tongue, or throat Side effects that usually do not require medical attention (report these to your care team if they continue or are bothersome): Chills Fatigue Headache Joint pain Loss of appetite Muscle pain Nausea Pain, redness, or irritation at injection site This list may not describe all possible side effects. Call your doctor   for medical advice about side effects. You may report side effects to FDA at 1-800-FDA-1088. Where  should I keep my medication? The vaccine is only given by your care team. It will not be stored at home. NOTE: This sheet is a summary. It may not cover all possible information. If you have questions about this medicine, talk to your doctor, pharmacist, or health care provider.  2023 Elsevier/Gold Standard (2007-02-28 00:00:00)  

## 2021-11-19 NOTE — Progress Notes (Signed)
[place]       Telephone: [phone]?Fax: [phone]   Oncology Clinical Pharmacist Practitioner Initial Assessment  Darlene [name] is a 71 y.o. female with a diagnosis of breast cancer. They were contacted today via in-person visit.  Indication/Regimen Capecitabine (Xeloda) is being used appropriately for treatment of metastatic TNBC breast cancer by Dr. Nicholas Lose.      Wt Readings from Last 1 Encounters:  11/19/21 82.9 kg (182 lb 12.8 oz)    Estimated body surface area is 2.01 meters squared as calculated from the following:   Height as of this encounter: _0  (1.753 m).   Weight as of this encounter: 82.9 kg (182 lb 12.8 oz).  The dosing regimen is 3 tablets (1500 mg) by mouth in the morning and 3 tablets (1500 mg) in the evening on days 1 to 14 of a 21-day cycle. This is being given  monotherapy. It is planned to continue until disease progression or unacceptable toxicity. Patient started Xeloda on 11/12/2021   Dose Modifications No dose adjustment is necessary at this time. Patient has a history of CKD, with a baseline Scr of ~1.7. Today, her Scr is 1.8  Access Assessment Darlene [name] will be receiving capecitabine through [facility] Concerns: No concerns voiced Start date if known: 11/12/2021  Allergies Allergies  Allergen Reactions   Antihistamines, Diphenhydramine-Type Other (See Comments)    IV diphenhydramine causes sedation.   Nsaids Other (See Comments)    H/o gastric bypass - avoid NSAIDs   Tape Hives, Rash and Other (See Comments)    Adhesives in bandaids    Vitals    11/19/2021   11:03 AM 11/07/2021    9:53 AM 11/02/2021    9:52 AM  Oncology Vitals  Height 175 cm 175 cm   Weight 82.918 kg 83.008 kg 83.371 kg  Weight (lbs) 182 lbs 13 oz 183 lbs 183 lbs 13 oz  BMI 26.99 kg/m2   26.99 kg/m2 27.02 kg/m2   27.02 kg/m2 27.14 kg/m2   27.14 kg/m2  Temp 98.2 F (36.8 C)  98.1  F (36.7 C)  Pulse Rate 51 62 56  BP 137/65 152/65 144/59  Resp 18    SpO2 97 % 99 % 100 %  BSA (m2) 2.01 m2   2.01 m2 2.01 m2   2.01 m2 2.01 m2   2.01 m2     Laboratory Data    Latest Ref Rng & Units 11/19/2021   10:41 AM 11/02/2021    9:29 AM 10/19/2021   12:02 PM  CBC EXTENDED  WBC 4.0 - 10.5 K/uL 5.7  4.1  2.5   RBC 3.87 - 5.11 MIL/uL 3.05  2.90  2.75   Hemoglobin 12.0 - 15.0 g/dL 10.4  9.8  9.6   HCT 36.0 - 46.0 % 30.0  28.8  27.5   Platelets 150 - 400 K/uL 168  208  235   NEUT# 1.7 - 7.7 K/uL 4.4  2.6  1.0   Lymph# 0.7 - 4.0 K/uL 0.7  0.8  1.0        Latest Ref Rng & Units 11/19/2021   10:41 AM 11/02/2021    9:29 AM 10/19/2021   12:02 PM  CMP  Glucose 70 - 99 mg/dL 270  126  152   BUN 8 - 23 mg/dL 62  36  39   Creatinine 0.44 - 1.00 mg/dL 1.98  1.71  1.83   Sodium 135 - 145 mmol/L  137  141  140   Potassium 3.5 - 5.1 mmol/L 4.4  4.2  4.7   Chloride 98 - 111 mmol/L 105  109  106   CO2 22 - 32 mmol/L _0 Calcium 8.9 - 10.3 mg/dL 8.9  8.9  8.9   Total Protein 6.5 - 8.1 g/dL 6.5  6.1  6.2   Total Bilirubin 0.3 - 1.2 mg/dL 0.5  0.4  0.4   Alkaline Phos 38 - 126 U/L 69  54  55   AST 15 - 41 U/L _1 ALT 0 - 44 U/L _2 Lab Results  Component Value Date   MG 1.9 11/02/2021   MG 2.0 10/12/2021   MG 1.9 09/21/2021   Lab Results  Component Value Date   CA2729 29.9 10/27/2018   CA2729 21.8 11/10/2017     Contraindications Contraindications were reviewed? Yes Contraindications to therapy were identified? No   Safety Precautions The following safety precautions for the use of capecitabine were reviewed:  Fever: reviewed the importance of having a thermometer and the Centers for Disease Control and Prevention (CDC) definition of fever which is 100.29F (38C) or higher. Patient should call 24/7 triage at [phone] if experiencing a fever or any other symptoms Diarrhea Cardiotoxicity Dehydration and renal  failure Nausea Vomiting Palmar-plantar erythrodysesthesia (Hand-Foot Syndrome) Stomatitis Neutropenia Alopecia Fatigue Storage and Handling To be taken within 30 minutes of a meal Missed doses  Medication Reconciliation Current Outpatient Medications  Medication Sig Dispense Refill   Accu-Chek Softclix Lancets lancets 1 each by Other route 4 (four) times daily. Test blood sugars 4 x times daily 200 each 1   aspirin EC 81 MG tablet Take 1 tablet (81 mg total) by mouth daily. 90 tablet 3   Bempedoic Acid-Ezetimibe (NEXLIZET) 180-10 MG TABS Take 1 tablet by mouth daily. 90 tablet 3   Blood Glucose Calibration (ACCU-CHEK GUIDE CONTROL) LIQD 1 Bottle by In Vitro route as needed. 1 each 0   Blood Glucose Monitoring Suppl (ACCU-CHEK GUIDE) w/Device KIT 1 each by Does not apply route 4 (four) times daily. Use to test blood sugars 4x a day 1 kit 0   buPROPion (WELLBUTRIN SR) 200 MG 12 hr tablet Take one tablet by mouth once daily. 90 tablet 0   CALCIUM CITRATE-VITAMIN D PO Take 1 tablet by mouth 3 (three) times daily.      capecitabine (XELODA) 500 MG tablet Take 3 tablets (1,500 mg total) by mouth 2 (two) times daily after a meal. Take for 14 days on, 7 days off. Repeat every 21 days. 84 tablet 3   carvedilol (COREG) 6.25 MG tablet Take 1 tablet (6.25 mg total) by mouth 2 (two) times daily. 180 tablet 3   cholecalciferol (VITAMIN D) 1000 units tablet Take 1,000 Units by mouth daily.     Cyanocobalamin (VITAMIN B-12 PO) Take 1 tablet by mouth every 14 (fourteen) days.     glucose blood (ACCU-CHEK GUIDE) test strip Use Accu Chek test strips to check blood sugar 4 times daily. DX:E11.21 400 each 0   insulin aspart (NOVOLOG) 100 UNIT/ML injection Inject into the skin daily as needed for high blood sugar. Per patient, sliding scale with meals as needed     insulin glargine (LANTUS SOLOSTAR) 100 UNIT/ML Solostar Pen Inject 8 Units into the skin daily. 7 mL 3   KERENDIA 20 MG TABS Take 1 tablet by  mouth daily. 30 tablet 6   Lactobacillus ([employer] ACIDOPHILUS PROBIOTIC) 0.5 MG TABS Take 1 tablet by mouth daily.     melatonin 5 MG TABS Take 5 mg by mouth at bedtime.     Pediatric Multivitamins-Iron (FLINTSTONES PLUS IRON) chewable tablet Chew 1 tablet by mouth 2 (two) times daily.     polyethylene glycol (MIRALAX / GLYCOLAX) 17 g packet Take 17 g by mouth daily.     prochlorperazine (COMPAZINE) 10 MG tablet Take 1 tablet (10 mg total) by mouth every 6 (six) hours as needed (Nausea or vomiting). 30 tablet 6   Propylene Glycol (SYSTANE BALANCE OP) Place 1-2 drops into both eyes 3 (three) times daily as needed (for dry eyes).      SURE COMFORT PEN NEEDLES 32G X 4 MM MISC Use as instructed to inject insulin and Victoza daily. 200 each 0   tobramycin (TOBREX) 0.3 % ophthalmic ointment Place 1 application into both eyes as needed (Takes day before eye injections).     vortioxetine HBr (TRINTELLIX) 20 MG TABS tablet Take 1 tablet (20 mg total) by mouth at bedtime. 90 tablet 0   No current facility-administered medications for this visit.   Facility-Administered Medications Ordered in Other Visits  Medication Dose Route Frequency Provider Last Rate Last Admin   acetaminophen (TYLENOL) 325 MG tablet            diphenhydrAMINE (BENADRYL) 50 MG/ML injection            famotidine (PEPCID) 20-0.9 MG/50ML-% IVPB            palonosetron (ALOXI) 0.25 MG/5ML injection             Medication reconciliation is based on the patient's most recent medication list in the electronic medical record (EMR) including herbal products and OTC medications.   The patient's medication list was reviewed today with the patient? Yes   Drug-drug interactions (DDIs) DDIs were evaluated? Yes Significant DDIs identified? No   Drug-Food Interactions Drug-food interactions were evaluated? Yes Drug-food interactions identified? No   Follow-up Plan  Follow up with clinical pharmacist in about 2 weeks, prior to the start of  the next cycle of Xeloda Follow up with Dr. Lindi Adie in about 5 weeks, prior to the start of the third cycle of Xeloda. Dr. Lindi Adie will likely order re-staging scans at this time.   Darlene [name] participated in the discussion, expressed understanding, and voiced agreement with the above plan. All questions were answered to her satisfaction. The patient was advised to contact the clinic at [phone] with any questions or concerns prior to her return visit.   I spent 20 minutes assessing the patient.  Karmen Stabs, RPH, 11/19/2021 11:44 AM  **Disclaimer: This note was dictated with voice recognition software. Similar sounding words can inadvertently be transcribed and this note may contain transcription errors which may not have been corrected upon publication of note.**

## 2021-11-20 ENCOUNTER — Other Ambulatory Visit (HOSPITAL_COMMUNITY): Payer: Self-pay

## 2021-11-26 ENCOUNTER — Encounter: Payer: Medicare PPO | Admitting: Internal Medicine

## 2021-11-27 ENCOUNTER — Encounter: Payer: Self-pay | Admitting: Hematology and Oncology

## 2021-11-28 ENCOUNTER — Encounter: Payer: Self-pay | Admitting: Hematology and Oncology

## 2021-11-28 ENCOUNTER — Inpatient Hospital Stay: Payer: Medicare PPO | Attending: Oncology | Admitting: Hematology and Oncology

## 2021-11-28 ENCOUNTER — Other Ambulatory Visit (HOSPITAL_COMMUNITY): Payer: Self-pay

## 2021-11-28 DIAGNOSIS — C787 Secondary malignant neoplasm of liver and intrahepatic bile duct: Secondary | ICD-10-CM | POA: Insufficient documentation

## 2021-11-28 DIAGNOSIS — C50411 Malignant neoplasm of upper-outer quadrant of right female breast: Secondary | ICD-10-CM

## 2021-11-28 DIAGNOSIS — Z452 Encounter for adjustment and management of vascular access device: Secondary | ICD-10-CM | POA: Insufficient documentation

## 2021-11-28 DIAGNOSIS — Z171 Estrogen receptor negative status [ER-]: Secondary | ICD-10-CM | POA: Insufficient documentation

## 2021-11-28 DIAGNOSIS — C50811 Malignant neoplasm of overlapping sites of right female breast: Secondary | ICD-10-CM | POA: Insufficient documentation

## 2021-11-28 MED ORDER — CAPECITABINE 500 MG PO TABS
1000.0000 mg | ORAL_TABLET | Freq: Two times a day (BID) | ORAL | 3 refills | Status: DC
  Filled 2021-11-28: qty 56, 21d supply, fill #0
  Filled 2021-12-11: qty 56, 21d supply, fill #1
  Filled 2022-01-03: qty 56, 21d supply, fill #2

## 2021-11-28 NOTE — Assessment & Plan Note (Signed)
07/19/2014: Right breast cancer grade 2 IDC ER/PR negative HER2 positive Ki-67 90% treated with neoadjuvant chemo with TCHP x6 cycles followed by Herceptin maintenance, right lumpectomy had residual disease 4 cm 1/3 lymph node positive, ER/PR negative, adjuvant radiation and capecitabine completed 09/27/2015 12/06/2015: Recurrence: Right subpectoral lymph node and subcutaneous nodule, liver mass (biopsy adenocarcinoma consistent with upper GI or pancreaticobiliary primary, HER2 negative, ER negative) July 2019: Metastatic disease: TDM 1 followed by Herceptin maintenance, pertuzumab added 10/06/2018, Abraxane 12/31/2017-09/01/2018, Herceptin and pertuzumab maintenance   Prior treatment: Enhertu started 10/05/2020 switched to Herceptin maintenance 02/19/2021 switched to Enhertu at higher dosage x 3 cycles discontinued 08/01/2021    02/04/2021: PET CT scan: Complete metabolic response, no evidence of hypermetabolic disease in the liver (mass measured 2.7 cm previously is now 1.5 cm with no uptake)    08/20/2021: CT CAP: Increased size of the metastatic liver lesions went from 6 cm to 7.5 cm and hepatic dome lesion is now 17 mm previously 10 mm, stable size of the right subpectoral lymph node, no new sites of metastatic disease, stable upper abdominal lymph nodes probably benign  ------------------------------------------------------------------------------------------------------------------------------------------- Current treatment: Xeloda 14 days 7 days off started 11/12/2021  Xeloda toxicities: Intermittent diarrhea Nausea and vomiting I reduce the dosage of Xeloda to 2 tablets in the morning and 2 tablets in the evening starting next Monday.  I sent a new prescription for this.

## 2021-11-28 NOTE — Progress Notes (Signed)
HEMATOLOGY-ONCOLOGY TELEPHONE VISIT PROGRESS NOTE  I connected with our patient on 11/28/21 at  1:30 PM EST by telephone and verified that I am speaking with the correct person using two identifiers.  I discussed the limitations, risks, security and privacy concerns of performing an evaluation and management service by telephone and the availability of in person appointments.  I also discussed with the patient that there may be a patient responsible charge related to this service. The patient expressed understanding and agreed to proceed.   History of Present Illness:   Oncology History  Breast cancer of upper-outer quadrant of right female breast ([place])  07/19/2014 Initial Biopsy   (R) breast needle biopsy (10:00): IDC, DCIS, grade 2. ER-, PR-, HER2+ (ratio 2.42). Ki67 90%.    07/22/2014 Initial Diagnosis   Breast cancer of upper-outer quadrant of right female breast   08/08/2014 - 11/21/2014 Neo-Adjuvant Chemotherapy   Taxotere/Carbo/Herceptin/Perjeta x 6 cycles.    12/12/2014 - 10/02/2015 Chemotherapy   Maintenance Herceptin (to complete 1 year of therapy).    12/27/2014 Surgery   (R) lumpectomy with SL[facility]): IDC, grade 2, spanning 4 cm, associated high grade DCIS. Negative margins. 1/3 SLN (+).   ER/PR repeated and remain negative.   ypT2, ypN1a: Stage IIB   01/06/2015 Surgery   Bilateral breast mammoplasty ([place]): Right breast path with intralymphatic emboli of ductal carcinoma. Left breast benign.    02/02/2015 Imaging   CT chest: No findings of metastatic disease in the chest. Right axillary fluid density lesion likely postoperative seroma or hematoma. Small left subpectoral nodes warrant followup attention.   02/02/2015 Imaging   Bone scan: No scintigraphic evidence of osseous metastatic disease   02/16/2015 Surgery   (R) mastectomy and ALND [facility]): Scattered foci of high grade DCIS, scattered intralymphatic tumor emboli. Margins negative. 0/15 LNs.    04/05/2015 -  05/19/2015 Radiation Therapy   Adjuvant XRT ([place]). The right chest wall was treated to 50.4 Gy in 28 fractions at 1.8 Gy per fraction.  The right mastectomy scar was treated to 10 Gy in 5 fractions at 2 Gy per fraction.    12/06/2015 PET scan   Right subpectoral lymph node measuring 9 mm and exhibits malignant range FDG uptake, suspicious for metastatic adenopathy. Subcutaneous nodule within the upper left chest wall exhibits malignant range uptake, which is indeterminate; this is at the site of previous Port-A-Cath and is favored to represent postsurgical change. Indeterminate left adrenal nodules which exhibit mild uptake, favored to represent benign adenoma.   08/11/2017 - 11/03/2017 Chemotherapy   The patient had ado-trastuzumab emtansine (KADCYLA) 320 mg in sodium chloride 0.9 % 250 mL chemo infusion, 3.6 mg/kg = 320 mg, Intravenous, Once, 4 of 5 cycles Administration: 320 mg (08/12/2017), 320 mg (09/01/2017), 320 mg (09/23/2017)  for chemotherapy treatment.    11/18/2017 - 08/30/2020 Chemotherapy   The patient had trastuzumab (HERCEPTIN) 546 mg in sodium chloride 0.9 % 250 mL chemo infusion, 6 mg/kg = 546 mg (100 % of original dose 6 mg/kg), Intravenous,  Once, 2 of 2 cycles Dose modification: 6 mg/kg (original dose 6 mg/kg, Cycle 1, Reason: Provider Judgment) Administration: 546 mg (11/18/2017), 546 mg (12/16/2017) pertuzumab (PERJETA) 420 mg in sodium chloride 0.9 % 250 mL chemo infusion, 420 mg (100 % of original dose 420 mg), Intravenous, Once, 15 of 15 cycles Dose modification: 420 mg (original dose 420 mg, Cycle 18), 420 mg (original dose 420 mg, Cycle 27) Administration: 420 mg (10/06/2018), 420 mg (10/27/2018), 420 mg (11/18/2018), 420  mg (12/08/2018), 420 mg (12/29/2018), 420 mg (01/19/2019), 420 mg (02/16/2019), 420 mg (03/16/2019), 420 mg (04/13/2019), 420 mg (06/08/2019), 420 mg (07/06/2019), 420 mg (08/03/2019), 420 mg (08/31/2019), 420 mg (09/28/2019), 420 mg (05/19/2019) trastuzumab-anns  (KANJINTI) 546 mg in sodium chloride 0.9 % 250 mL chemo infusion, 6 mg/kg = 546 mg (100 % of original dose 6 mg/kg), Intravenous,  Once, 30 of 30 cycles Dose modification: 6 mg/kg (original dose 6 mg/kg, Cycle 3, Reason: Other (see comments), Comment: requested step therapy by [facility] insurance), 4 mg/kg (original dose 6 mg/kg, Cycle 9, Reason: Provider Judgment), 6 mg/kg (original dose 6 mg/kg, Cycle 17, Reason: Provider Judgment), 4 mg/kg (original dose 6 mg/kg, Cycle 27, Reason: Provider Judgment), 6 mg/kg (original dose 6 mg/kg, Cycle 27, Reason: Provider Judgment) Administration: 546 mg (01/13/2018), 546 mg (02/10/2018), 546 mg (03/03/2018), 546 mg (03/24/2018), 546 mg (04/14/2018), 546 mg (05/05/2018), 357 mg (05/26/2018), 357 mg (06/09/2018), 357 mg (06/23/2018), 357 mg (07/07/2018), 357 mg (07/21/2018), 357 mg (08/04/2018), 357 mg (08/18/2018), 357 mg (09/01/2018), 546 mg (09/15/2018), 546 mg (10/06/2018), 546 mg (10/27/2018), 546 mg (11/18/2018), 546 mg (12/08/2018), 546 mg (12/29/2018), 546 mg (01/19/2019), 546 mg (02/16/2019), 480 mg (03/16/2019), 483 mg (04/13/2019), 450 mg (06/08/2019), 450 mg (07/06/2019), 450 mg (08/03/2019), 450 mg (08/31/2019), 450 mg (09/28/2019)  for chemotherapy treatment.    12/31/2017 - 09/01/2018 Chemotherapy   The patient had PACLitaxel-protein bound (ABRAXANE) chemo infusion 175 mg, 80 mg/m2 = 175 mg (100 % of original dose 80 mg/m2), Intravenous,  Once, 5 of 10 cycles Dose modification: 80 mg/m2 (original dose 80 mg/m2, Cycle 11) Administration: 175 mg (07/07/2018), 175 mg (07/21/2018), 175 mg (08/04/2018), 175 mg (08/18/2018), 175 mg (09/01/2018) PACLitaxel-protein bound (ABRAXANE) chemo infusion 200 mg, 100 mg/m2 = 200 mg, Intravenous, Once, 10 of 10 cycles Dose modification: 80 mg/m2 (original dose 100 mg/m2, Cycle 2, Reason: Provider Judgment), 80 mg/m2 (original dose 100 mg/m2, Cycle 2, Reason: Provider Judgment) Administration: 200 mg (12/31/2017), 200 mg (01/06/2018), 175 mg (01/20/2018), 175  mg (01/27/2018), 175 mg (02/10/2018), 175 mg (03/03/2018), 175 mg (03/10/2018), 175 mg (03/24/2018), 175 mg (04/07/2018), 175 mg (04/14/2018), 175 mg (04/28/2018), 175 mg (05/05/2018), 175 mg (05/26/2018), 175 mg (06/09/2018), 175 mg (05/19/2018)  for chemotherapy treatment.    10/05/2020 - 01/18/2021 Chemotherapy   Patient is on Treatment Plan : BREAST METASTATIC fam-trastuzumab deruxtecan-nxki (Enhertu) q21d     02/19/2021 - 05/14/2021 Chemotherapy   Patient is on Treatment Plan : BREAST Trastuzumab q21d x 13 cycles     06/20/2021 - 08/01/2021 Chemotherapy   Patient is on Treatment Plan : BREAST METASTATIC fam-trastuzumab deruxtecan-nxki (Enhertu) q21d     07/25/2021 - 10/19/2021 Chemotherapy   Patient is on Treatment Plan : BREAST METASTATIC Sacituzumab govitecan-hziy Ivette Loyal) D1,8 q21d     08/31/2021 - 09/21/2021 Chemotherapy   Patient is on Treatment Plan : BREAST METASTATIC Sacituzumab govitecan-hziy Ivette Loyal) q21d     Metastasis to liver ([place])  08/04/2017 Initial Diagnosis   Metastasis to liver ([place])   08/12/2017 - 10/14/2017 Chemotherapy   The patient had ado-trastuzumab emtansine (KADCYLA) 320 mg in sodium chloride 0.9 % 250 mL chemo infusion, 3.6 mg/kg = 320 mg, Intravenous, Once, 4 of 5 cycles Administration: 320 mg (08/12/2017), 320 mg (09/01/2017), 320 mg (09/23/2017)  for chemotherapy treatment.    11/18/2017 - 08/30/2020 Chemotherapy   The patient had trastuzumab (HERCEPTIN) 546 mg in sodium chloride 0.9 % 250 mL chemo infusion, 6 mg/kg = 546 mg (100 % of original dose 6 mg/kg), Intravenous,  Once,  2 of 2 cycles Dose modification: 6 mg/kg (original dose 6 mg/kg, Cycle 1, Reason: Provider Judgment) Administration: 546 mg (11/18/2017), 546 mg (12/16/2017) pertuzumab (PERJETA) 420 mg in sodium chloride 0.9 % 250 mL chemo infusion, 420 mg (100 % of original dose 420 mg), Intravenous, Once, 15 of 15 cycles Dose modification: 420 mg (original dose 420 mg, Cycle 18), 420 mg (original dose 420 mg, Cycle  27) Administration: 420 mg (10/06/2018), 420 mg (10/27/2018), 420 mg (11/18/2018), 420 mg (12/08/2018), 420 mg (12/29/2018), 420 mg (01/19/2019), 420 mg (02/16/2019), 420 mg (03/16/2019), 420 mg (04/13/2019), 420 mg (06/08/2019), 420 mg (07/06/2019), 420 mg (08/03/2019), 420 mg (08/31/2019), 420 mg (09/28/2019), 420 mg (05/19/2019) trastuzumab-anns (KANJINTI) 546 mg in sodium chloride 0.9 % 250 mL chemo infusion, 6 mg/kg = 546 mg (100 % of original dose 6 mg/kg), Intravenous,  Once, 30 of 30 cycles Dose modification: 6 mg/kg (original dose 6 mg/kg, Cycle 3, Reason: Other (see comments), Comment: requested step therapy by [facility] insurance), 4 mg/kg (original dose 6 mg/kg, Cycle 9, Reason: Provider Judgment), 6 mg/kg (original dose 6 mg/kg, Cycle 17, Reason: Provider Judgment), 4 mg/kg (original dose 6 mg/kg, Cycle 27, Reason: Provider Judgment), 6 mg/kg (original dose 6 mg/kg, Cycle 27, Reason: Provider Judgment) Administration: 546 mg (01/13/2018), 546 mg (02/10/2018), 546 mg (03/03/2018), 546 mg (03/24/2018), 546 mg (04/14/2018), 546 mg (05/05/2018), 357 mg (05/26/2018), 357 mg (06/09/2018), 357 mg (06/23/2018), 357 mg (07/07/2018), 357 mg (07/21/2018), 357 mg (08/04/2018), 357 mg (08/18/2018), 357 mg (09/01/2018), 546 mg (09/15/2018), 546 mg (10/06/2018), 546 mg (10/27/2018), 546 mg (11/18/2018), 546 mg (12/08/2018), 546 mg (12/29/2018), 546 mg (01/19/2019), 546 mg (02/16/2019), 480 mg (03/16/2019), 483 mg (04/13/2019), 450 mg (06/08/2019), 450 mg (07/06/2019), 450 mg (08/03/2019), 450 mg (08/31/2019), 450 mg (09/28/2019)  for chemotherapy treatment.      REVIEW OF SYSTEMS:   Constitutional: Denies fevers, chills or abnormal weight loss All other systems were reviewed with the patient and are negative. Observations/Objective:     Assessment Plan:  Breast cancer of upper-outer quadrant of right female breast ([place]) 07/19/2014: Right breast cancer grade 2 IDC ER/PR negative HER2 positive Ki-67 90% treated with neoadjuvant chemo with TCHP x6  cycles followed by Herceptin maintenance, right lumpectomy had residual disease 4 cm 1/3 lymph node positive, ER/PR negative, adjuvant radiation and capecitabine completed 09/27/2015 12/06/2015: Recurrence: Right subpectoral lymph node and subcutaneous nodule, liver mass (biopsy adenocarcinoma consistent with upper GI or pancreaticobiliary primary, HER2 negative, ER negative) July 2019: Metastatic disease: TDM 1 followed by Herceptin maintenance, pertuzumab added 10/06/2018, Abraxane 12/31/2017-09/01/2018, Herceptin and pertuzumab maintenance   Prior treatment: Enhertu started 10/05/2020 switched to Herceptin maintenance 02/19/2021 switched to Enhertu at higher dosage x 3 cycles discontinued 08/01/2021    02/04/2021: PET CT scan: Complete metabolic response, no evidence of hypermetabolic disease in the liver (mass measured 2.7 cm previously is now 1.5 cm with no uptake)    08/20/2021: CT CAP: Increased size of the metastatic liver lesions went from 6 cm to 7.5 cm and hepatic dome lesion is now 17 mm previously 10 mm, stable size of the right subpectoral lymph node, no new sites of metastatic disease, stable upper abdominal lymph nodes probably benign  ------------------------------------------------------------------------------------------------------------------------------------------- Current treatment: Xeloda 14 days 7 days off started 11/12/2021  Xeloda toxicities: Intermittent diarrhea Nausea and vomiting I reduce the dosage of Xeloda to 2 tablets in the morning and 2 tablets in the evening starting next Monday.  I sent a new prescription for this.    I discussed  the assessment and treatment plan with the patient. The patient was provided an opportunity to ask questions and all were answered. The patient agreed with the plan and demonstrated an understanding of the instructions. The patient was advised to call back or seek an in-person evaluation if the symptoms worsen or if the condition fails to  improve as anticipated.   I provided 15 minutes of non-face-to-face time during this encounter.  This includes time for charting and coordination of care   Harriette Ohara, MD

## 2021-12-03 ENCOUNTER — Inpatient Hospital Stay: Payer: Medicare PPO

## 2021-12-03 ENCOUNTER — Inpatient Hospital Stay: Payer: Medicare PPO | Admitting: Pharmacist

## 2021-12-03 VITALS — BP 131/52 | HR 81 | Temp 98.1°F | Resp 16 | Ht 69.0 in | Wt 174.1 lb

## 2021-12-03 DIAGNOSIS — C50411 Malignant neoplasm of upper-outer quadrant of right female breast: Secondary | ICD-10-CM

## 2021-12-03 DIAGNOSIS — Z171 Estrogen receptor negative status [ER-]: Secondary | ICD-10-CM

## 2021-12-03 DIAGNOSIS — C50811 Malignant neoplasm of overlapping sites of right female breast: Secondary | ICD-10-CM | POA: Diagnosis not present

## 2021-12-03 DIAGNOSIS — Z452 Encounter for adjustment and management of vascular access device: Secondary | ICD-10-CM | POA: Diagnosis not present

## 2021-12-03 DIAGNOSIS — Z95828 Presence of other vascular implants and grafts: Secondary | ICD-10-CM

## 2021-12-03 DIAGNOSIS — C787 Secondary malignant neoplasm of liver and intrahepatic bile duct: Secondary | ICD-10-CM | POA: Diagnosis not present

## 2021-12-03 LAB — CMP (CANCER CENTER ONLY)
ALT: 11 U/L (ref 0–44)
AST: 23 U/L (ref 15–41)
Albumin: 3.8 g/dL (ref 3.5–5.0)
Alkaline Phosphatase: 51 U/L (ref 38–126)
Anion gap: 6 (ref 5–15)
BUN: 42 mg/dL — ABNORMAL HIGH (ref 8–23)
CO2: 25 mmol/L (ref 22–32)
Calcium: 8.6 mg/dL — ABNORMAL LOW (ref 8.9–10.3)
Chloride: 107 mmol/L (ref 98–111)
Creatinine: 1.94 mg/dL — ABNORMAL HIGH (ref 0.44–1.00)
GFR, Estimated: 28 mL/min — ABNORMAL LOW (ref >60)
Glucose, Bld: 221 mg/dL — ABNORMAL HIGH (ref 70–99)
Potassium: 4.2 mmol/L (ref 3.5–5.1)
Sodium: 138 mmol/L (ref 135–145)
Total Bilirubin: 0.5 mg/dL (ref 0.3–1.2)
Total Protein: 6.1 g/dL — ABNORMAL LOW (ref 6.5–8.1)

## 2021-12-03 LAB — CBC WITH DIFFERENTIAL (CANCER CENTER ONLY)
Abs Immature Granulocytes: 0.05 K/uL (ref 0.00–0.07)
Basophils Absolute: 0 K/uL (ref 0.0–0.1)
Basophils Relative: 0 %
Eosinophils Absolute: 0.6 K/uL — ABNORMAL HIGH (ref 0.0–0.5)
Eosinophils Relative: 10 %
HCT: 27.5 % — ABNORMAL LOW (ref 36.0–46.0)
Hemoglobin: 9.5 g/dL — ABNORMAL LOW (ref 12.0–15.0)
Immature Granulocytes: 1 %
Lymphocytes Relative: 12 %
Lymphs Abs: 0.6 K/uL — ABNORMAL LOW (ref 0.7–4.0)
MCH: 34.3 pg — ABNORMAL HIGH (ref 26.0–34.0)
MCHC: 34.5 g/dL (ref 30.0–36.0)
MCV: 99.3 fL (ref 80.0–100.0)
Monocytes Absolute: 0.7 K/uL (ref 0.1–1.0)
Monocytes Relative: 13 %
Neutro Abs: 3.4 K/uL (ref 1.7–7.7)
Neutrophils Relative %: 64 %
Platelet Count: 197 K/uL (ref 150–400)
RBC: 2.77 MIL/uL — ABNORMAL LOW (ref 3.87–5.11)
RDW: 14 % (ref 11.5–15.5)
WBC Count: 5.4 K/uL (ref 4.0–10.5)
nRBC: 0 % (ref 0.0–0.2)

## 2021-12-03 LAB — CEA (IN HOUSE-CHCC): CEA (CHCC-In House): 1407 ng/mL — ABNORMAL HIGH (ref 0.00–5.00)

## 2021-12-03 MED ORDER — ONDANSETRON HCL 8 MG PO TABS: 8.0000 mg | ORAL_TABLET | Freq: Three times a day (TID) | ORAL | 2 refills | Status: DC | PRN

## 2021-12-03 MED ORDER — HEPARIN SOD (PORK) LOCK FLUSH 100 UNIT/ML IV SOLN
500.0000 [IU] | Freq: Once | INTRAVENOUS | Status: AC
  Administered 2021-12-03: 500 [IU]

## 2021-12-03 MED ORDER — SODIUM CHLORIDE 0.9% FLUSH
10.0000 mL | Freq: Once | INTRAVENOUS | Status: AC
  Administered 2021-12-03: 10 mL

## 2021-12-03 NOTE — Progress Notes (Signed)
[place]       Telephone: [phone]?Fax: [phone]   Oncology Clinical Pharmacist Practitioner Progress Note  Darlene [name] [facility] was contacted via in-person visit to discuss her chemotherapy regimen for capecitabine which they receive under the care of Dr. Nicholas Lose.   Current treatment regimen and start date Capecitabine (11/12/21)  Interval History She continues on capecitabine 2 tablets (1000 mg) in the morning and 2 tablets (1000 mg) in the evening on days 1 to 14 of a 21-day cycle. This is being given  monotherapy. Therapy is planned to continue until disease progression or unacceptable toxicity.  Darlene [name] was seen today by clinical pharmacy as a follow-up to her capecitabine management.  She last saw Dr. Lindi Adie on 11/28/21 and clinical pharmacy on 11/19/21.  At her last visit with Dr. Lindi Adie, she was reporting loss of appetite, GI upset, stomatitis, and fatigue.  Dr. Lindi Adie decided to lower her dose of capecitabine from 3 tablets every 12 hours 14/21 to 2 tablets every 12 hours 14/21.  She is starting this new dose today.  Response to Therapy Darlene [name] states that she is doing well.  The symptoms that she was experiencing when she last saw Dr. Lindi Adie have improved.  We discussed today that monitoring for side effects with her new dose of capecitabine will be very important.  As above, she starting this new dose today.  Labs were delayed slightly today because she request them to be through the port.  We will update her upcoming labs to reflect this request.  We have sent a refill request for ondansetron to her local pharmacy of choice.  We have discussed that she can continue to use this agent and prochlorperazine as needed for nausea.  She also continues to use loperamide as needed for diarrhea.  Her biggest concern today is she feels like she does not have an appetite.  She has some weight since her last visit with Dr. Lindi Adie and we discussed  potentially seeing nutrition which she would like to do.  We will put in this ambulatory referral today.  She also reports some bloating and gas.  We discussed that she could consider using simethicone as needed for the symptoms.  She says she will consider this.  Her hemoglobin has decreased since her last visit and this may also be contributing to her fatigue.  We will continue to monitor.  Her calcium was somewhat low today and she does report not taking her calcium chewable tablets on a regular basis.  She states she will restart this and we will continue to monitor her calcium levels.  She does have CKD and Dr. Lindi Adie have been closely monitoring her kidney function.  She is on a reduced dose of capecitabine.  Her serum creatinine has decreased since her last visit.  She continues to follow with [facility] and sees Danie Chandler soon.  She did not know the exact date.  She also continues to see Dr. Chalmers Cater, her endocrinologist, for diabetes management.  She states that she is scheduling this visit soon.  Dr. Aundra Dubin from [facility] manages her heart medications and she will be seeing them again on 12/28/21.  She also sees Dr. Leafy Ro the weight management center and will see them again on 12/27/21.  Office is also been managing her B12 and we discussed potentially getting a lab for this value when she sees them again.  Darlene [name] verbalized understanding of the plan.  She will  see Dr. Lindi Adie again with labs on 12/25/21 and these labs will be accessed through her port per her request.  He will likely order restaging scans at that time since her last scans were done on 10/31/21.  Of note, she receives no contrast due to her impaired kidney function.  Her CEA was elevated at her last visit with Dr. Ladona Mow which she has just recently started capecitabine on 11/12/21 so we will continue to monitor.  Labs, vitals, treatment parameters, and manufacturer guidelines assessing toxicity  were reviewed with Darlene [name] today. Based on these values, patient is in agreement to continue therapy at this time.  Allergies Allergies  Allergen Reactions   Antihistamines, Diphenhydramine-Type Other (See Comments)    IV diphenhydramine causes sedation.   Nsaids Other (See Comments)    H/o gastric bypass - avoid NSAIDs   Tape Hives, Rash and Other (See Comments)    Adhesives in bandaids    Vitals    12/03/2021   10:20 AM 11/19/2021   11:03 AM 11/07/2021    9:53 AM  Oncology Vitals  Height 175 cm 175 cm 175 cm  Weight 78.971 kg 82.918 kg 83.008 kg  Weight (lbs) 174 lbs 2 oz 182 lbs 13 oz 183 lbs  BMI 25.71 kg/m2   25.71 kg/m2 26.99 kg/m2   26.99 kg/m2 27.02 kg/m2   27.02 kg/m2  Temp 98.1 F (36.7 C) 98.2 F (36.8 C)   Pulse Rate 81 51 62  BP 131/52 137/65 152/65  Resp 16 18   SpO2 99 % 97 % 99 %  BSA (m2) 1.96 m2   1.96 m2 2.01 m2   2.01 m2 2.01 m2   2.01 m2    Laboratory Data    Latest Ref Rng & Units 12/03/2021   10:18 AM 11/19/2021   10:41 AM 11/02/2021    9:29 AM  CBC EXTENDED  WBC 4.0 - 10.5 K/uL 5.4  5.7  4.1   RBC 3.87 - 5.11 MIL/uL 2.77  3.05  2.90   Hemoglobin 12.0 - 15.0 g/dL 9.5  10.4  9.8   HCT 36.0 - 46.0 % 27.5  30.0  28.8   Platelets 150 - 400 K/uL 197  168  208   NEUT# 1.7 - 7.7 K/uL 3.4  4.4  2.6   Lymph# 0.7 - 4.0 K/uL 0.6  0.7  0.8        Latest Ref Rng & Units 12/03/2021   10:18 AM 11/19/2021   10:41 AM 11/02/2021    9:29 AM  CMP  Glucose 70 - 99 mg/dL 221  270  126   BUN 8 - 23 mg/dL 42  62  36   Creatinine 0.44 - 1.00 mg/dL 1.94  1.98  1.71   Sodium 135 - 145 mmol/L 138  137  141   Potassium 3.5 - 5.1 mmol/L 4.2  4.4  4.2   Chloride 98 - 111 mmol/L 107  105  109   CO2 22 - 32 mmol/L _0 Calcium 8.9 - 10.3 mg/dL 8.6  8.9  8.9   Total Protein 6.5 - 8.1 g/dL 6.1  6.5  6.1   Total Bilirubin 0.3 - 1.2 mg/dL 0.5  0.5  0.4   Alkaline Phos 38 - 126 U/L 51  69  54   AST 15 - 41 U/L _1 ALT 0 - 44  U/L _2 Lab  Results  Component Value Date   MG 1.9 11/02/2021   MG 2.0 10/12/2021   MG 1.9 09/21/2021   Lab Results  Component Value Date   CA2729 29.9 10/27/2018   CA2729 21.8 11/10/2017     Adverse Effects Assessment Diarrhea: Improved since her last visit with Dr. Lindi Adie.  She will continue to use loperamide as needed Nausea: Improved since her last visit with Dr. Lindi Adie.  He did lower the dose of capecitabine and we will closely monitor.  We did send a new prescription for ondansetron and she will continue to use prochlorperazine as well Low hemoglobin: We will continue to monitor Low calcium: She will restart her calcium chewables on a regular basis.  She does report missing some doses. Flatulence: She will consider using as needed simethicone  Adherence Assessment Darlene [name] reports missing 0 doses over the past 2 weeks.   Reason for missed dose: N/A Patient was re-educated on importance of adherence.   Access Assessment Darlene [name] is currently receiving her capecitabine through [place] concerns: None  Medication Reconciliation The patient's medication list was reviewed today with the patient?  Yes New medications or herbal supplements have recently been started?  No Any medications have been discontinued?  No The medication list was updated and reconciled based on the patient's most recent medication list in the electronic medical record (EMR) including herbal products and OTC medications.   Medications Current Outpatient Medications  Medication Sig Dispense Refill   Accu-Chek Softclix Lancets lancets 1 each by Other route 4 (four) times daily. Test blood sugars 4 x times daily 200 each 1   aspirin EC 81 MG tablet Take 1 tablet (81 mg total) by mouth daily. 90 tablet 3   Bempedoic Acid-Ezetimibe (NEXLIZET) 180-10 MG TABS Take 1 tablet by mouth daily. 90 tablet 3   Blood Glucose Calibration (ACCU-CHEK  GUIDE CONTROL) LIQD 1 Bottle by In Vitro route as needed. 1 each 0   Blood Glucose Monitoring Suppl (ACCU-CHEK GUIDE) w/Device KIT 1 each by Does not apply route 4 (four) times daily. Use to test blood sugars 4x a day 1 kit 0   buPROPion (WELLBUTRIN SR) 200 MG 12 hr tablet Take one tablet by mouth once daily. 90 tablet 0   CALCIUM CITRATE-VITAMIN D PO Take 1 tablet by mouth 3 (three) times daily.      capecitabine (XELODA) 500 MG tablet Take 2 tablets (1,000 mg total) by mouth 2 (two) times daily after a meal. Take for 14 days on, 7 days off. Repeat every 21 days. 60 tablet 3   carvedilol (COREG) 6.25 MG tablet Take 1 tablet (6.25 mg total) by mouth 2 (two) times daily. 180 tablet 3   cholecalciferol (VITAMIN D) 1000 units tablet Take 1,000 Units by mouth daily.     Cyanocobalamin (VITAMIN B-12 PO) Take 1 tablet by mouth every 14 (fourteen) days.     glucose blood (ACCU-CHEK GUIDE) test strip Use Accu Chek test strips to check blood sugar 4 times daily. DX:E11.21 400 each 0   insulin aspart (NOVOLOG) 100 UNIT/ML injection Inject into the skin daily as needed for high blood sugar. Per patient, sliding scale with meals as needed     insulin glargine (LANTUS SOLOSTAR) 100 UNIT/ML Solostar Pen Inject 8 Units into the skin daily. 7 mL 3   KERENDIA 20 MG TABS Take 1 tablet by mouth daily. 30 tablet 6   Lactobacillus ([employer] ACIDOPHILUS PROBIOTIC) 0.5 MG TABS Take 1  tablet by mouth daily.     loperamide (IMODIUM) 2 MG capsule Take 2 mg by mouth as needed for diarrhea or loose stools.     melatonin 5 MG TABS Take 5 mg by mouth at bedtime.     ondansetron (ZOFRAN) 8 MG tablet Take 1 tablet (8 mg total) by mouth every 8 (eight) hours as needed for nausea or vomiting. 30 tablet 2   Pediatric Multivitamins-Iron (FLINTSTONES PLUS IRON) chewable tablet Chew 1 tablet by mouth 2 (two) times daily.     polyethylene glycol (MIRALAX / GLYCOLAX) 17 g packet Take 17 g by mouth daily.     prochlorperazine (COMPAZINE) 10 MG  tablet Take 1 tablet (10 mg total) by mouth every 6 (six) hours as needed (Nausea or vomiting). 30 tablet 6   Propylene Glycol (SYSTANE BALANCE OP) Place 1-2 drops into both eyes 3 (three) times daily as needed (for dry eyes).      SURE COMFORT PEN NEEDLES 32G X 4 MM MISC Use as instructed to inject insulin and Victoza daily. 200 each 0   tobramycin (TOBREX) 0.3 % ophthalmic ointment Place 1 application into both eyes as needed (Takes day before eye injections).     vortioxetine HBr (TRINTELLIX) 20 MG TABS tablet Take 1 tablet (20 mg total) by mouth at bedtime. 90 tablet 0   No current facility-administered medications for this visit.   Facility-Administered Medications Ordered in Other Visits  Medication Dose Route Frequency Provider Last Rate Last Admin   acetaminophen (TYLENOL) 325 MG tablet            diphenhydrAMINE (BENADRYL) 50 MG/ML injection            famotidine (PEPCID) 20-0.9 MG/50ML-% IVPB            palonosetron (ALOXI) 0.25 MG/5ML injection             Drug-Drug Interactions (DDIs) DDIs were evaluated?  Yes Significant DDIs?  No, patient with CKD and Dr. Lindi Adie is closely monitoring her kidney function.  She is also on a reduced dose of capecitabine.  Kidney function has improved since last visit. The patient was instructed to speak with their health care provider and/or the oral chemotherapy pharmacist before starting any new drug, including prescription or over the counter, natural / herbal products, or vitamins.  Supportive Care Continue as needed medications for diarrhea, nausea Continue to use lotions for preventative hand-foot syndrome.  She is not experiencing any symptoms at this time. Consider starting simethicone for flatulence She will start taking her chewable calcium on a regular basis as her calcium was slightly low today  Dosing Assessment Hepatic adjustments needed?  No Renal adjustments needed?  No, closely monitoring her kidney function as she has CKD.   Dr. Lindi Adie aware and she is on a reduced dose of capecitabine.  Kidney function has improved with Dr. Geralyn Flash last visit. Toxicity adjustments needed?  No, will closely monitor reduced dose and make adjustments accordingly The current dosing regimen is appropriate to continue at this time.  Follow-Up Plan Continue capecitabine 1000 mg every 12 hours for 14 days, followed by a 7-day rest period. Continue loperamide for diarrhea as needed Continue ondansetron and prochlorperazine for nausea Consider starting simethicone for gas and indigestion Continue to follow with Dr. Aundra Dubin.  Next visit 12/24/21 Continue to follow with Dr. Leafy Ro.  Next visit 12/27/21 Continue to follow with Dr. Chalmers Cater.  Patient will schedule this appointment Continue to follow with [facility].  Darlene [name]  was unaware of this date but says it is soon. Port flush with labs, Dr. Lindi Adie visit, on 12/25/21.  Labs should be port flush going forward per patient request.  Dr. Lindi Adie will likely order restaging scans at this time.  Last on 10/31/21.  Of note, patient had noncontrast CT CAP in the past.  She has known CKD Port flush with labs, pharmacy clinic visit, in mid February. Continue to monitor hemoglobin, calcium, serum creatinine Ambulatory referral to nutrition ordered  Darlene [name] participated in the discussion, expressed understanding, and voiced agreement with the above plan. All questions were answered to her satisfaction. The patient was advised to contact the clinic at [phone] with any questions or concerns prior to her return visit.   I spent 30 minutes assessing and educating the patient.  Raina Mina, RPH-CPP, 12/03/2021  12:06 PM   **Disclaimer: This note was dictated with voice recognition software. Similar sounding words can inadvertently be transcribed and this note may contain transcription errors which may not have been corrected upon publication of note.**

## 2021-12-04 ENCOUNTER — Telehealth: Payer: Self-pay | Admitting: Pharmacist

## 2021-12-04 NOTE — Telephone Encounter (Signed)
Scheduled appointment per 11/13 los. Patient is aware.

## 2021-12-05 ENCOUNTER — Ambulatory Visit: Payer: Medicare PPO | Admitting: Podiatry

## 2021-12-05 ENCOUNTER — Encounter: Payer: Self-pay | Admitting: Hematology and Oncology

## 2021-12-05 ENCOUNTER — Encounter: Payer: Self-pay | Admitting: Podiatry

## 2021-12-05 DIAGNOSIS — N184 Chronic kidney disease, stage 4 (severe): Secondary | ICD-10-CM | POA: Diagnosis not present

## 2021-12-05 DIAGNOSIS — E1140 Type 2 diabetes mellitus with diabetic neuropathy, unspecified: Secondary | ICD-10-CM

## 2021-12-05 DIAGNOSIS — M21372 Foot drop, left foot: Secondary | ICD-10-CM | POA: Diagnosis not present

## 2021-12-05 DIAGNOSIS — M2011 Hallux valgus (acquired), right foot: Secondary | ICD-10-CM | POA: Diagnosis not present

## 2021-12-05 DIAGNOSIS — M79675 Pain in left toe(s): Secondary | ICD-10-CM

## 2021-12-05 DIAGNOSIS — E1149 Type 2 diabetes mellitus with other diabetic neurological complication: Secondary | ICD-10-CM | POA: Diagnosis not present

## 2021-12-05 DIAGNOSIS — M2041 Other hammer toe(s) (acquired), right foot: Secondary | ICD-10-CM | POA: Diagnosis not present

## 2021-12-05 DIAGNOSIS — M79674 Pain in right toe(s): Secondary | ICD-10-CM

## 2021-12-05 DIAGNOSIS — M21611 Bunion of right foot: Secondary | ICD-10-CM | POA: Diagnosis not present

## 2021-12-05 DIAGNOSIS — B351 Tinea unguium: Secondary | ICD-10-CM | POA: Diagnosis not present

## 2021-12-05 NOTE — Progress Notes (Signed)
This patient returns to my office for at risk foot care.  This patient requires this care by a professional since this patient will be at risk due to having diabetic neuropathy.    This patient is unable to cut nails herself since the patient cannot reach her nails.These nails are painful walking and wearing shoes.  This patient presents for at risk foot care today.  General Appearance  Alert, conversant and in no acute stress.  Vascular  Dorsalis pedis and posterior tibial  pulses are palpable  bilaterally.  Capillary return is within normal limits  bilaterally. Temperature is within normal limits  bilaterally.  Neurologic  Senn-Weinstein monofilament wire test absent  bilaterally. Muscle power within normal limits bilaterally.  Nails Thick disfigured discolored nails with subungual debris  hallux toenails bilaterally.   No evidence of bacterial infection or drainage bilaterally.  Orthopedic  No limitations of motion  feet .  No crepitus or effusions noted.  No bony pathology or digital deformities noted.  HAV.    Hammer toes  Skin  normotropic skin with no porokeratosis noted bilaterally.  No signs of infections or ulcers noted.     Onychomycosis  Pain in right toes  Pain in left toes  HAV  B/L  Hammer toes  B/L.  Consent was obtained for treatment procedures.   Mechanical debridement of nails 1-5  bilaterally performed with a nail nipper.  Filed with dremel without incident.     Return office visit   10 weeks                  Told patient to return for periodic foot care and evaluation due to potential at risk complications.   Gardiner Barefoot DPM

## 2021-12-07 ENCOUNTER — Inpatient Hospital Stay: Payer: Medicare PPO | Admitting: Dietician

## 2021-12-07 NOTE — Progress Notes (Signed)
Nutrition Assessment   Reason for Assessment: MD referral   ASSESSMENT: 71 year old female with recurrent breast cancer metastatic to liver. She is currently receiving xeloda (started 11/12/21). Patient is under the care of Dr. Lindi Adie.   Past medical history includes Roux En Y (2014), CKD4, anxiety/depression, OSA on CPAP, DM2, anemia in neoplastic disease, vit D deficiency  Met with patient in office. She reports tolerating dose reduced chemotherapy much better. Patient says she is fatigued and is aware that she has not been eating enough. Patient has not felt up for cooking recently. She and her husband have been eating more take out foods. Patient reports gastric bypass ~8 years ago. She continues to take one Flinstone chewable vitamin BID. Sometimes she forgets to take her calcium. Patient takes B12 once every few weeks after having elevated B12 level a few years ago. She has not had this checked in a while. Patient denies nausea, vomiting, diarrhea. She is constipated. Patient is going to restart daily miralax.  Nutrition Focused Physical Exam: deferred    Medications: wellbutrin, coreg, B12, pepcid, lantus, lactobacillus, imodium, melatonin, zofran, miralax, compazine, trintellix, cal citrate with D, vit D   Labs: 11/13 - Hgb 9.5, glucose 221, BUN 42, Cr 1.94, Ca 8.6   Anthropometrics:   Height: 5'9" Weight: 174 lb 1.6 oz  UBW: 176 lb (July) BMI: 25.71   NUTRITION DIAGNOSIS: Food and nutrition related knowledge deficit related to chronic illness (metastatic breast ca, CKD4, DM2) as evidenced by pt report   INTERVENTION:  Encouraged high protein snacks in between meals - provided ideas + handout provided Suggested CIB as alternate ONS - samples given Suggested flavored mocha ONS to add to coffee vs going to [employer] Patient will contact PCP to request labs - suspect low B12  Educated on flinstones MVI does not meet adult RDI's - recommend taking bariatric MVI Contact  information provided   MONITORING, EVALUATION, GOAL: Patient will tolerate increased calories and protein to maintain weight   Next Visit: No follow-up scheduled - pt encouraged to call with questions or concerns

## 2021-12-10 ENCOUNTER — Encounter: Payer: Medicare PPO | Admitting: Internal Medicine

## 2021-12-11 ENCOUNTER — Other Ambulatory Visit (HOSPITAL_COMMUNITY): Payer: Self-pay

## 2021-12-18 ENCOUNTER — Other Ambulatory Visit (HOSPITAL_COMMUNITY): Payer: Self-pay

## 2021-12-19 ENCOUNTER — Encounter: Payer: Self-pay | Admitting: Internal Medicine

## 2021-12-19 ENCOUNTER — Other Ambulatory Visit (INDEPENDENT_AMBULATORY_CARE_PROVIDER_SITE_OTHER): Payer: Self-pay | Admitting: Family Medicine

## 2021-12-19 ENCOUNTER — Ambulatory Visit (INDEPENDENT_AMBULATORY_CARE_PROVIDER_SITE_OTHER): Payer: Medicare PPO | Admitting: Internal Medicine

## 2021-12-19 VITALS — BP 120/64 | HR 80 | Temp 97.6°F | Ht 69.5 in | Wt 174.0 lb

## 2021-12-19 DIAGNOSIS — I10 Essential (primary) hypertension: Secondary | ICD-10-CM

## 2021-12-19 DIAGNOSIS — Z794 Long term (current) use of insulin: Secondary | ICD-10-CM

## 2021-12-19 DIAGNOSIS — E11311 Type 2 diabetes mellitus with unspecified diabetic retinopathy with macular edema: Secondary | ICD-10-CM | POA: Diagnosis not present

## 2021-12-19 DIAGNOSIS — Z0000 Encounter for general adult medical examination without abnormal findings: Secondary | ICD-10-CM | POA: Diagnosis not present

## 2021-12-19 DIAGNOSIS — E538 Deficiency of other specified B group vitamins: Secondary | ICD-10-CM | POA: Diagnosis not present

## 2021-12-19 DIAGNOSIS — F3289 Other specified depressive episodes: Secondary | ICD-10-CM

## 2021-12-19 DIAGNOSIS — N184 Chronic kidney disease, stage 4 (severe): Secondary | ICD-10-CM | POA: Diagnosis not present

## 2021-12-19 DIAGNOSIS — C50911 Malignant neoplasm of unspecified site of right female breast: Secondary | ICD-10-CM | POA: Diagnosis not present

## 2021-12-19 DIAGNOSIS — H6191 Disorder of right external ear, unspecified: Secondary | ICD-10-CM

## 2021-12-19 DIAGNOSIS — C50411 Malignant neoplasm of upper-outer quadrant of right female breast: Secondary | ICD-10-CM | POA: Diagnosis not present

## 2021-12-19 DIAGNOSIS — E119 Type 2 diabetes mellitus without complications: Secondary | ICD-10-CM

## 2021-12-19 DIAGNOSIS — Z171 Estrogen receptor negative status [ER-]: Secondary | ICD-10-CM

## 2021-12-19 LAB — HEMOGLOBIN A1C: Hgb A1c MFr Bld: 7.4 % — ABNORMAL HIGH (ref 4.6–6.5)

## 2021-12-19 LAB — VITAMIN B12: Vitamin B-12: >1500 pg/mL — ABNORMAL HIGH (ref 211–911)

## 2021-12-19 LAB — VITAMIN D 25 HYDROXY (VIT D DEFICIENCY, FRACTURES): VITD: 63.18 ng/mL (ref 30.00–100.00)

## 2021-12-19 NOTE — Progress Notes (Signed)
Established Patient Office Visit     CC/Reason for Visit: Annual preventive exam and subsequent Medicare wellness visit  HPI: Darlene [name] is a 71 y.o. female who is coming in today for the above mentioned reasons. Past Medical History is significant for: hypertension, aortic atherosclerosis, cardiomyopathy secondary to chemotherapeutic agent, she has a history of breast cancer status post right-sided mastectomy, chemotherapy, radiation with recurrence, currently on xeloda.  She has a history of obstructive sleep apnea on CPAP.  Stage IV chronic kidney disease presumably due to diabetes, type 2 diabetes that has been well controlled, hyperlipidemia, depression.  She has significant peripheral neuropathy secondary to chemotherapy.  She is feeling relatively well other than decreased exercise tolerance.  She has routine eye and dental care.  She wears hearing aids bilaterally.  She is due for RSV, COVID and second shingles vaccine.  She has a lesion on her right earlobe that is ulcerated.  All cancer screening is up-to-date.  Past Medical/Surgical History: Past Medical History:  Diagnosis Date   Anemia    hx of - during 1st round chemo   Anxiety    Arthritis    in fingers, shoulders   Back pain    Balance problem    Breast cancer ([place])    Breast cancer of upper-outer quadrant of right female breast ([place]) 07/22/2014   Bunion, right foot    CHF (congestive heart failure) ([facility])    Chronic kidney disease    creatininei levels high per pt    Clumsiness    Constipation    Depression    Diabetes mellitus without complication ([place])    45+ years type 2    Diabetic retinopathy ([place])    torn retina   Eating disorder    binge eating   Epistaxis 08/26/2014   Fatty liver    Foot drop, left foot    HCAP (healthcare-associated pneumonia) 12/18/2014   Heart murmur    never had any problems   History of radiation therapy 04/05/15-05/19/15   right chest wall was treated to 50.4 Gy in  28 fractions, right mastectomy scar was treated to 10 Gy in 5 fractions   Hyperlipidemia    Hypertension    Joint pain    Multiple food allergies    Neuromuscular disorder ([place])    diabetic neuropathy   Obesity    Obstructive sleep apnea on CPAP    does not use cpap all the time has lost 160 lbs    Ovarian cyst rupture    possible   Pneumonia    Pneumonia 11/2014   Radiation 04/05/15-05/19/15   right chest wall 50.4 Gy, mastectomy scar 10 Gy   Sleep apnea    Thyroid nodule 06/2014    Past Surgical History:  Procedure Laterality Date   ANAL RECTAL MANOMETRY N/A 08/01/2021   Procedure: ANO RECTAL MANOMETRY;  Surgeon: Thornton Park, MD;  Location: [facility];  Service: Gastroenterology;  Laterality: N/A;   APPENDECTOMY     BIOPSY THYROID Bilateral 06/2014   2 nodules - Benign    BREAST LUMPECTOMY WITH RADIOACTIVE SEED AND SENTINEL LYMPH NODE BIOPSY Right 12/27/2014   Procedure: BREAST LUMPECTOMY WITH RADIOACTIVE SEED AND SENTINEL LYMPH NODE BIOPSY;  Surgeon: Stark Klein, MD;  Location: [facility];  Service: General;  Laterality: Right;   BREAST REDUCTION SURGERY Bilateral 01/06/2015   Procedure: BILATERAL BREAST REDUCTION, RIGHT ONCOPLASTIC RECONSTRUCTION),LEFT BREAST REDUCTION FOR SYMMETRY;  Surgeon: [name], MD;  Location: [facility];  Service:  Plastics;  Laterality: Bilateral;   BREATH TEK H PYLORI N/A 09/15/2012   Procedure: BREATH TEK H PYLORI;  Surgeon: Pedro Earls, MD;  Location: [facility];  Service: General;  Laterality: N/A;   BUNIONECTOMY Left 12/2013   CARPAL TUNNEL RELEASE Right    CARPAL TUNNEL RELEASE Left    CATARACT EXTRACTION     6/19   COLONOSCOPY     DUPUYTREN CONTRACTURE RELEASE     GASTRIC ROUX-EN-Y N/A 11/02/2012   Procedure: LAPAROSCOPIC ROUX-EN-Y GASTRIC;  Surgeon: Pedro Earls, MD;  Location: [facility];  Service: General;  Laterality: N/A;   IR GENERIC HISTORICAL  03/18/2016   IR US GUIDE VASC ACCESS LEFT 03/18/2016 Markus Daft,  MD [facility]   IR GENERIC HISTORICAL  03/18/2016   IR FLUORO GUIDE CV LINE LEFT 03/18/2016 Markus Daft, MD [facility]   LAPAROSCOPIC APPENDECTOMY N/A 07/22/2015   Procedure: APPENDECTOMY LAPAROSCOPIC;  Surgeon: Michael Boston, MD;  Location: [facility];  Service: General;  Laterality: N/A;   MASTECTOMY Right 02/15/2015   modified   MODIFIED MASTECTOMY Right 02/15/2015   Procedure: RIGHT MODIFIED RADICAL MASTECTOMY;  Surgeon: Stark Klein, MD;  Location: [facility];  Service: General;  Laterality: Right;   ORIF WRIST FRACTURE Left 05/11/2019   Procedure: OPEN REDUCTION INTERNAL FIXATION (ORIF) left distal radius fracture;  Surgeon: Renette Butters, MD;  Location: [facility];  Service: Orthopedics;  Laterality: Left;  with block   PORT-A-CATH REMOVAL Left 10/10/2015   Procedure: PORT REMOVAL;  Surgeon: Stark Klein, MD;  Location: [facility];  Service: General;  Laterality: Left;   PORTACATH PLACEMENT Left 08/02/2014   Procedure: INSERTION PORT-A-CATH;  Surgeon: Stark Klein, MD;  Location: [facility];  Service: General;  Laterality: Left;   PORTACATH PLACEMENT N/A 11/05/2016   Procedure: INSERTION PORT-A-CATH;  Surgeon: Stark Klein, MD;  Location: [facility];  Service: General;  Laterality: N/A;   RETINAL LASER PROCEDURE     retina tear on left eye   ROTATOR CUFF REPAIR Right 2005   SCAR REVISION Right 12/08/2015   Procedure: COMPLEX REPAIR RIGHT CHEST 10-15CM;  Surgeon: [name], MD;  Location: [facility];  Service: Plastics;  Laterality: Right;   TOE SURGERY  1970s   bone spur   TRIGGER FINGER RELEASE     x3    Social History:  reports that she has never smoked. She has never used smokeless tobacco. She reports current alcohol use of about 1.0 - 2.0 standard drink of alcohol per week. She reports that she does not use drugs.  Allergies: Allergies  Allergen Reactions   Antihistamines, Diphenhydramine-Type Other (See Comments)    IV diphenhydramine causes sedation.    Nsaids Other (See Comments)    H/o gastric bypass - avoid NSAIDs   Tape Hives, Rash and Other (See Comments)    Adhesives in bandaids    Family History:  Family History  Problem Relation Age of Onset   CVA Mother    Heart disease Mother    Sudden death Mother    Diabetes Father    Heart failure Father    Hypertension Father    Kidney disease Father    Obesity Father    Hypertension Sister    Diabetes Brother    Breast cancer Paternal Grandmother 36   Diabetes Paternal Uncle    Ovarian cancer Neg Hx      Current Outpatient Medications:    Accu-Chek Softclix Lancets lancets, 1 each by Other route 4 (four)  times daily. Test blood sugars 4 x times daily, Disp: 200 each, Rfl: 1   aspirin EC 81 MG tablet, Take 1 tablet (81 mg total) by mouth daily., Disp: 90 tablet, Rfl: 3   Bempedoic Acid-Ezetimibe (NEXLIZET) 180-10 MG TABS, Take 1 tablet by mouth daily., Disp: 90 tablet, Rfl: 3   Blood Glucose Calibration (ACCU-CHEK GUIDE CONTROL) LIQD, 1 Bottle by In Vitro route as needed., Disp: 1 each, Rfl: 0   Blood Glucose Monitoring Suppl (ACCU-CHEK GUIDE) w/Device KIT, 1 each by Does not apply route 4 (four) times daily. Use to test blood sugars 4x a day, Disp: 1 kit, Rfl: 0   buPROPion (WELLBUTRIN SR) 200 MG 12 hr tablet, Take one tablet by mouth once daily., Disp: 90 tablet, Rfl: 0   CALCIUM CITRATE-VITAMIN D PO, Take 1 tablet by mouth 3 (three) times daily. , Disp: , Rfl:    capecitabine (XELODA) 500 MG tablet, Take 2 tablets (1,000 mg total) by mouth 2 (two) times daily after a meal. Take for 14 days on, 7 days off. Repeat every 21 days., Disp: 60 tablet, Rfl: 3   carvedilol (COREG) 6.25 MG tablet, Take 1 tablet (6.25 mg total) by mouth 2 (two) times daily., Disp: 180 tablet, Rfl: 3   cholecalciferol (VITAMIN D) 1000 units tablet, Take 1,000 Units by mouth daily., Disp: , Rfl:    Cyanocobalamin (VITAMIN B-12 PO), Take 1 tablet by mouth every 14 (fourteen) days., Disp: , Rfl:    glucose  blood (ACCU-CHEK GUIDE) test strip, Use Accu Chek test strips to check blood sugar 4 times daily. DX:E11.21, Disp: 400 each, Rfl: 0   insulin aspart (NOVOLOG) 100 UNIT/ML injection, Inject into the skin daily as needed for high blood sugar. Per patient, sliding scale with meals as needed, Disp: , Rfl:    insulin glargine (LANTUS SOLOSTAR) 100 UNIT/ML Solostar Pen, Inject 8 Units into the skin daily., Disp: 7 mL, Rfl: 3   KERENDIA 20 MG TABS, Take 1 tablet by mouth daily., Disp: 30 tablet, Rfl: 6   Lactobacillus ([employer] ACIDOPHILUS PROBIOTIC) 0.5 MG TABS, Take 1 tablet by mouth daily., Disp: , Rfl:    loperamide (IMODIUM) 2 MG capsule, Take 2 mg by mouth as needed for diarrhea or loose stools., Disp: , Rfl:    melatonin 5 MG TABS, Take 5 mg by mouth at bedtime., Disp: , Rfl:    ondansetron (ZOFRAN) 8 MG tablet, Take 1 tablet (8 mg total) by mouth every 8 (eight) hours as needed for nausea or vomiting., Disp: 30 tablet, Rfl: 2   Pediatric Multivitamins-Iron (FLINTSTONES PLUS IRON) chewable tablet, Chew 1 tablet by mouth 2 (two) times daily., Disp: , Rfl:    polyethylene glycol (MIRALAX / GLYCOLAX) 17 g packet, Take 17 g by mouth daily., Disp: , Rfl:    prochlorperazine (COMPAZINE) 10 MG tablet, Take 1 tablet (10 mg total) by mouth every 6 (six) hours as needed (Nausea or vomiting)., Disp: 30 tablet, Rfl: 6   Propylene Glycol (SYSTANE BALANCE OP), Place 1-2 drops into both eyes 3 (three) times daily as needed (for dry eyes). , Disp: , Rfl:    SURE COMFORT PEN NEEDLES 32G X 4 MM MISC, Use as instructed to inject insulin and Victoza daily., Disp: 200 each, Rfl: 0   tobramycin (TOBREX) 0.3 % ophthalmic ointment, Place 1 application into both eyes as needed (Takes day before eye injections)., Disp: , Rfl:    vortioxetine HBr (TRINTELLIX) 20 MG TABS tablet, Take 1 tablet (20 mg  total) by mouth at bedtime., Disp: 90 tablet, Rfl: 0 No current facility-administered medications for this  visit.  Facility-Administered Medications Ordered in Other Visits:    acetaminophen (TYLENOL) 325 MG tablet, , , ,    diphenhydrAMINE (BENADRYL) 50 MG/ML injection, , , ,    famotidine (PEPCID) 20-0.9 MG/50ML-% IVPB, , , ,    palonosetron (ALOXI) 0.25 MG/5ML injection, , , ,   Review of Systems:  Constitutional: Denies fever, chills, diaphoresis, appetite change. HEENT: Denies photophobia, eye pain, redness, hearing loss, ear pain, congestion, sore throat, rhinorrhea, sneezing, mouth sores, trouble swallowing, neck pain, neck stiffness and tinnitus.   Respiratory: Denies SOB, DOE, cough, chest tightness,  and wheezing.   Cardiovascular: Denies chest pain, palpitations and leg swelling.  Gastrointestinal: Denies nausea, vomiting, abdominal pain, diarrhea, constipation, blood in stool and abdominal distention.  Genitourinary: Denies dysuria, urgency, frequency, hematuria, flank pain and difficulty urinating.  Endocrine: Denies: hot or cold intolerance, sweats, changes in hair or nails, polyuria, polydipsia. Musculoskeletal: Denies myalgias, back pain, joint swelling, arthralgias and gait problem.  Skin: Denies pallor, rash and wound.  Neurological: Denies dizziness, seizures, syncope, weakness, light-headedness, numbness and headaches.  Hematological: Denies adenopathy. Easy bruising, personal or family bleeding history  Psychiatric/Behavioral: Denies suicidal ideation, mood changes, confusion, nervousness, sleep disturbance and agitation    Physical Exam: Vitals:   12/19/21 0832  BP: 120/64  Pulse: 80  Temp: 97.6 F (36.4 C)  TempSrc: Oral  SpO2: 98%  Weight: 174 lb (78.9 kg)  Height: 5' 9.5" (1.765 m)    Body mass index is 25.33 kg/m.   Constitutional: NAD, calm, comfortable Eyes: PERRL, lids and conjunctivae normal, wears corrective lenses ENMT: Mucous membranes are moist. Posterior pharynx clear of any exudate or lesions. Normal dentition. Tympanic membrane is pearly  white, no erythema or bulging. Neck: normal, supple, no masses, no thyromegaly Respiratory: clear to auscultation bilaterally, no wheezing, no crackles. Normal respiratory effort. No accessory muscle use.  Cardiovascular: Regular rate and rhythm, no murmurs / rubs / gallops. No extremity edema. 2+ pedal pulses. No carotid bruits.  Abdomen: no tenderness, no masses palpated. No hepatosplenomegaly. Bowel sounds positive.  Musculoskeletal: no clubbing / cyanosis. No joint deformity upper and lower extremities. Good ROM, no contractures. Normal muscle tone.  Skin: no rashes, lesions, ulcers. No induration Neurologic: CN 2-12 grossly intact. Sensation intact, DTR normal. Strength 5/5 in all 4.  Psychiatric: Normal judgment and insight. Alert and oriented x 3. Normal mood.   Subsequent Medicare wellness visit   1. Risk factors, based on past  M,S,F -cardiovascular disease risk factors include age, history of hypertension and known aortic atherosclerosis, hyperlipidemia and diabetes   2.  Physical activities: Is currently very deconditioned   3.  Depression/mood: History of depression but mood is stable   4.  Hearing: Wears hearing aids   5.  ADL's: Independent in all ADLs   6.  Fall risk: Low fall risk   7.  Home safety: No problems identified   8.  Height weight, and visual acuity: height and weight as above, vision:  Vision Screening   Right eye Left eye Both eyes  Without correction _0  With correction        9.  Counseling: Advised to update all age-appropriate immunizations   10. Lab orders based on risk factors: Laboratory update will be reviewed   11. Referral : Dermatology   12. Care plan: Follow-up with me in 6 months   13. Cognitive assessment:  No cognitive impairment   14. Screening: Patient provided with a written and personalized 5-10 year screening schedule in the AVS. yes   15. Provider List Update: PCP, oncology  16. Advance Directives: Full  code   17. Opioids: Patient is not on any opioid prescriptions and has no risk factors for a substance use disorder.   [facility] Office Visit from 12/19/2021 in [facility] at [place]  PHQ-9 Total Score 1          10/19/2021   12:00 PM 10/19/2021    1:20 PM 11/02/2021   10:00 AM 11/07/2021   10:00 AM 12/19/2021    8:30 AM  Fall Risk  Falls in the past year?    0 1  Was there an injury with Fall?     0  Fall Risk Category Calculator     1  Fall Risk Category     Low  Patient Fall Risk Level High fall risk High fall risk High fall risk  Low fall risk  Patient at Risk for Falls Due to     Impaired balance/gait  Fall risk Follow up     Falls evaluation completed      Impression and Plan:  Encounter for preventive health examination  Vitamin B12 deficiency - Plan: Vitamin B12, VITAMIN D 25 Hydroxy (Vit-D Deficiency, Fractures), VITAMIN D 25 Hydroxy (Vit-D Deficiency, Fractures), Vitamin B12  Essential hypertension  Malignant neoplasm of upper-outer quadrant of right breast in female, estrogen receptor negative ([place])  Type 2 diabetes mellitus without complication, with long-term current use of insulin ([place]) - Plan: Hemoglobin A1c, Hemoglobin A1c  CKD (chronic kidney disease) stage 4, GFR 15-29 ml/min ([facility])  Diabetic retinopathy of both eyes with macular edema associated with type 2 diabetes mellitus, unspecified retinopathy severity ([facility])  Recurrent breast cancer, right ([facility])  Lesion of right earlobe - Plan: Ambulatory referral to Dermatology   -Recommend routine eye and dental care. -Immunizations: Advised to update RSV, COVID and shingles vaccine at pharmacy -Healthy lifestyle discussed in detail. -Labs to be updated today. -Colon cancer screening: 11/2013 -Breast cancer screening: Currently undergoing treatment for breast cancer -Cervical cancer screening: 09/2021 -Lung cancer screening: Not applicable  -Prostate cancer screening: Not  applicable -DEXA: 07/2818    Lelon Frohlich, MD [facility]

## 2021-12-20 ENCOUNTER — Other Ambulatory Visit: Payer: Self-pay | Admitting: *Deleted

## 2021-12-20 ENCOUNTER — Other Ambulatory Visit (HOSPITAL_COMMUNITY): Payer: Self-pay

## 2021-12-20 DIAGNOSIS — E119 Type 2 diabetes mellitus without complications: Secondary | ICD-10-CM

## 2021-12-21 DIAGNOSIS — Z961 Presence of intraocular lens: Secondary | ICD-10-CM | POA: Diagnosis not present

## 2021-12-21 DIAGNOSIS — H5202 Hypermetropia, left eye: Secondary | ICD-10-CM | POA: Diagnosis not present

## 2021-12-21 DIAGNOSIS — D485 Neoplasm of uncertain behavior of skin: Secondary | ICD-10-CM | POA: Diagnosis not present

## 2021-12-21 DIAGNOSIS — C44222 Squamous cell carcinoma of skin of right ear and external auricular canal: Secondary | ICD-10-CM | POA: Diagnosis not present

## 2021-12-21 DIAGNOSIS — H04123 Dry eye syndrome of bilateral lacrimal glands: Secondary | ICD-10-CM | POA: Diagnosis not present

## 2021-12-21 LAB — HM DIABETES EYE EXAM

## 2021-12-22 NOTE — Progress Notes (Signed)
Patient Care Team: Isaac Bliss, Rayford Halsted, MD as PCP - General (Internal Medicine) Larey Dresser, MD as PCP - Advanced Heart Failure (Cardiology) Stark Klein, MD as Consulting Physician (General Surgery) Jacelyn Pi, MD as Consulting Physician (Endocrinology) [name], MD as Consulting Physician (Plastic Surgery) Gery Pray, MD as Consulting Physician (Radiation Oncology) Juanita Craver, MD as Consulting Physician (Gastroenterology) Alda Berthold, DO as Consulting Physician (Neurology) Starlyn Skeans, MD as Consulting Physician (Family Medicine) Sherlynn Stalls, MD as Consulting Physician (Ophthalmology) Salvadore Dom, MD as Consulting Physician (Obstetrics and Gynecology) Raina Mina, RPH-CPP (Pharmacist) Nicholas Lose, MD as Consulting Physician (Hematology and Oncology)  DIAGNOSIS: No diagnosis found.  SUMMARY OF ONCOLOGIC HISTORY: Oncology History  Breast cancer of upper-outer quadrant of right female breast ([place])  07/19/2014 Initial Biopsy   (R) breast needle biopsy (10:00): IDC, DCIS, grade 2. ER-, PR-, HER2+ (ratio 2.42). Ki67 90%.    07/22/2014 Initial Diagnosis   Breast cancer of upper-outer quadrant of right female breast   08/08/2014 - 11/21/2014 Neo-Adjuvant Chemotherapy   Taxotere/Carbo/Herceptin/Perjeta x 6 cycles.    12/12/2014 - 10/02/2015 Chemotherapy   Maintenance Herceptin (to complete 1 year of therapy).    12/27/2014 Surgery   (R) lumpectomy with [facility]): IDC, grade 2, spanning 4 cm, associated high grade DCIS. Negative margins. 1/3 SLN (+).   ER/PR repeated and remain negative.   ypT2, ypN1a: Stage IIB   01/06/2015 Surgery   Bilateral breast mammoplasty ([place]): Right breast path with intralymphatic emboli of ductal carcinoma. Left breast benign.    02/02/2015 Imaging   CT chest: No findings of metastatic disease in the chest. Right axillary fluid density lesion likely postoperative seroma or hematoma. Small  left subpectoral nodes warrant followup attention.   02/02/2015 Imaging   Bone scan: No scintigraphic evidence of osseous metastatic disease   02/16/2015 Surgery   (R) mastectomy and ALND [facility]): Scattered foci of high grade DCIS, scattered intralymphatic tumor emboli. Margins negative. 0/15 LNs.    04/05/2015 - 05/19/2015 Radiation Therapy   Adjuvant XRT ([place]). The right chest wall was treated to 50.4 Gy in 28 fractions at 1.8 Gy per fraction.  The right mastectomy scar was treated to 10 Gy in 5 fractions at 2 Gy per fraction.    12/06/2015 PET scan   Right subpectoral lymph node measuring 9 mm and exhibits malignant range FDG uptake, suspicious for metastatic adenopathy. Subcutaneous nodule within the upper left chest wall exhibits malignant range uptake, which is indeterminate; this is at the site of previous Port-A-Cath and is favored to represent postsurgical change. Indeterminate left adrenal nodules which exhibit mild uptake, favored to represent benign adenoma.   08/11/2017 - 11/03/2017 Chemotherapy   The patient had ado-trastuzumab emtansine (KADCYLA) 320 mg in sodium chloride 0.9 % 250 mL chemo infusion, 3.6 mg/kg = 320 mg, Intravenous, Once, 4 of 5 cycles Administration: 320 mg (08/12/2017), 320 mg (09/01/2017), 320 mg (09/23/2017)  for chemotherapy treatment.    11/18/2017 - 08/30/2020 Chemotherapy   The patient had trastuzumab (HERCEPTIN) 546 mg in sodium chloride 0.9 % 250 mL chemo infusion, 6 mg/kg = 546 mg (100 % of original dose 6 mg/kg), Intravenous,  Once, 2 of 2 cycles Dose modification: 6 mg/kg (original dose 6 mg/kg, Cycle 1, Reason: Provider Judgment) Administration: 546 mg (11/18/2017), 546 mg (12/16/2017) pertuzumab (PERJETA) 420 mg in sodium chloride 0.9 % 250 mL chemo infusion, 420 mg (100 % of original dose 420 mg), Intravenous, Once, 15 of 15 cycles Dose  modification: 420 mg (original dose 420 mg, Cycle 18), 420 mg (original dose 420 mg, Cycle 27) Administration: 420  mg (10/06/2018), 420 mg (10/27/2018), 420 mg (11/18/2018), 420 mg (12/08/2018), 420 mg (12/29/2018), 420 mg (01/19/2019), 420 mg (02/16/2019), 420 mg (03/16/2019), 420 mg (04/13/2019), 420 mg (06/08/2019), 420 mg (07/06/2019), 420 mg (08/03/2019), 420 mg (08/31/2019), 420 mg (09/28/2019), 420 mg (05/19/2019) trastuzumab-anns (KANJINTI) 546 mg in sodium chloride 0.9 % 250 mL chemo infusion, 6 mg/kg = 546 mg (100 % of original dose 6 mg/kg), Intravenous,  Once, 30 of 30 cycles Dose modification: 6 mg/kg (original dose 6 mg/kg, Cycle 3, Reason: Other (see comments), Comment: requested step therapy by [facility] insurance), 4 mg/kg (original dose 6 mg/kg, Cycle 9, Reason: Provider Judgment), 6 mg/kg (original dose 6 mg/kg, Cycle 17, Reason: Provider Judgment), 4 mg/kg (original dose 6 mg/kg, Cycle 27, Reason: Provider Judgment), 6 mg/kg (original dose 6 mg/kg, Cycle 27, Reason: Provider Judgment) Administration: 546 mg (01/13/2018), 546 mg (02/10/2018), 546 mg (03/03/2018), 546 mg (03/24/2018), 546 mg (04/14/2018), 546 mg (05/05/2018), 357 mg (05/26/2018), 357 mg (06/09/2018), 357 mg (06/23/2018), 357 mg (07/07/2018), 357 mg (07/21/2018), 357 mg (08/04/2018), 357 mg (08/18/2018), 357 mg (09/01/2018), 546 mg (09/15/2018), 546 mg (10/06/2018), 546 mg (10/27/2018), 546 mg (11/18/2018), 546 mg (12/08/2018), 546 mg (12/29/2018), 546 mg (01/19/2019), 546 mg (02/16/2019), 480 mg (03/16/2019), 483 mg (04/13/2019), 450 mg (06/08/2019), 450 mg (07/06/2019), 450 mg (08/03/2019), 450 mg (08/31/2019), 450 mg (09/28/2019)  for chemotherapy treatment.    12/31/2017 - 09/01/2018 Chemotherapy   The patient had PACLitaxel-protein bound (ABRAXANE) chemo infusion 175 mg, 80 mg/m2 = 175 mg (100 % of original dose 80 mg/m2), Intravenous,  Once, 5 of 10 cycles Dose modification: 80 mg/m2 (original dose 80 mg/m2, Cycle 11) Administration: 175 mg (07/07/2018), 175 mg (07/21/2018), 175 mg (08/04/2018), 175 mg (08/18/2018), 175 mg (09/01/2018) PACLitaxel-protein bound (ABRAXANE) chemo  infusion 200 mg, 100 mg/m2 = 200 mg, Intravenous, Once, 10 of 10 cycles Dose modification: 80 mg/m2 (original dose 100 mg/m2, Cycle 2, Reason: Provider Judgment), 80 mg/m2 (original dose 100 mg/m2, Cycle 2, Reason: Provider Judgment) Administration: 200 mg (12/31/2017), 200 mg (01/06/2018), 175 mg (01/20/2018), 175 mg (01/27/2018), 175 mg (02/10/2018), 175 mg (03/03/2018), 175 mg (03/10/2018), 175 mg (03/24/2018), 175 mg (04/07/2018), 175 mg (04/14/2018), 175 mg (04/28/2018), 175 mg (05/05/2018), 175 mg (05/26/2018), 175 mg (06/09/2018), 175 mg (05/19/2018)  for chemotherapy treatment.    10/05/2020 - 01/18/2021 Chemotherapy   Patient is on Treatment Plan : BREAST METASTATIC fam-trastuzumab deruxtecan-nxki ([place]her[place]) q21d     02/19/2021 - 05/14/2021 Chemotherapy   Patient is on Treatment Plan : BREAST Trastuzumab q21d x 13 cycles     06/20/2021 - 08/01/2021 Chemotherapy   Patient is on Treatment Plan : BREAST METASTATIC fam-trastuzumab deruxtecan-nxki (Enhertu) q21d     07/25/2021 - 10/19/2021 Chemotherapy   Patient is on Treatment Plan : BREAST METASTATIC Sacituzumab govitecan-hziy Ivette Loyal) D1,8 q21d     08/31/2021 - 09/21/2021 Chemotherapy   Patient is on Treatment Plan : BREAST METASTATIC Sacituzumab govitecan-hziy Ivette Loyal) q21d     Metastasis to liver ([place])  08/04/2017 Initial Diagnosis   Metastasis to liver ([place])   08/12/2017 - 10/14/2017 Chemotherapy   The patient had ado-trastuzumab emtansine (KADCYLA) 320 mg in sodium chloride 0.9 % 250 mL chemo infusion, 3.6 mg/kg = 320 mg, Intravenous, Once, 4 of 5 cycles Administration: 320 mg (08/12/2017), 320 mg (09/01/2017), 320 mg (09/23/2017)  for chemotherapy treatment.    11/18/2017 - 08/30/2020 Chemotherapy   The patient had  trastuzumab (HERCEPTIN) 546 mg in sodium chloride 0.9 % 250 mL chemo infusion, 6 mg/kg = 546 mg (100 % of original dose 6 mg/kg), Intravenous,  Once, 2 of 2 cycles Dose modification: 6 mg/kg (original dose 6 mg/kg, Cycle 1, Reason: Provider  Judgment) Administration: 546 mg (11/18/2017), 546 mg (12/16/2017) pertuzumab (PERJETA) 420 mg in sodium chloride 0.9 % 250 mL chemo infusion, 420 mg (100 % of original dose 420 mg), Intravenous, Once, 15 of 15 cycles Dose modification: 420 mg (original dose 420 mg, Cycle 18), 420 mg (original dose 420 mg, Cycle 27) Administration: 420 mg (10/06/2018), 420 mg (10/27/2018), 420 mg (11/18/2018), 420 mg (12/08/2018), 420 mg (12/29/2018), 420 mg (01/19/2019), 420 mg (02/16/2019), 420 mg (03/16/2019), 420 mg (04/13/2019), 420 mg (06/08/2019), 420 mg (07/06/2019), 420 mg (08/03/2019), 420 mg (08/31/2019), 420 mg (09/28/2019), 420 mg (05/19/2019) trastuzumab-anns (KANJINTI) 546 mg in sodium chloride 0.9 % 250 mL chemo infusion, 6 mg/kg = 546 mg (100 % of original dose 6 mg/kg), Intravenous,  Once, 30 of 30 cycles Dose modification: 6 mg/kg (original dose 6 mg/kg, Cycle 3, Reason: Other (see comments), Comment: requested step therapy by [facility] insurance), 4 mg/kg (original dose 6 mg/kg, Cycle 9, Reason: Provider Judgment), 6 mg/kg (original dose 6 mg/kg, Cycle 17, Reason: Provider Judgment), 4 mg/kg (original dose 6 mg/kg, Cycle 27, Reason: Provider Judgment), 6 mg/kg (original dose 6 mg/kg, Cycle 27, Reason: Provider Judgment) Administration: 546 mg (01/13/2018), 546 mg (02/10/2018), 546 mg (03/03/2018), 546 mg (03/24/2018), 546 mg (04/14/2018), 546 mg (05/05/2018), 357 mg (05/26/2018), 357 mg (06/09/2018), 357 mg (06/23/2018), 357 mg (07/07/2018), 357 mg (07/21/2018), 357 mg (08/04/2018), 357 mg (08/18/2018), 357 mg (09/01/2018), 546 mg (09/15/2018), 546 mg (10/06/2018), 546 mg (10/27/2018), 546 mg (11/18/2018), 546 mg (12/08/2018), 546 mg (12/29/2018), 546 mg (01/19/2019), 546 mg (02/16/2019), 480 mg (03/16/2019), 483 mg (04/13/2019), 450 mg (06/08/2019), 450 mg (07/06/2019), 450 mg (08/03/2019), 450 mg (08/31/2019), 450 mg (09/28/2019)  for chemotherapy treatment.      CHIEF COMPLIANT:   INTERVAL HISTORY: Sorayah Schrodt is a   ALLERGIES:   is allergic to antihistamines, diphenhydramine-type; nsaids; and tape.  MEDICATIONS:  Current Outpatient Medications  Medication Sig Dispense Refill   Accu-Chek Softclix Lancets lancets 1 each by Other route 4 (four) times daily. Test blood sugars 4 x times daily 200 each 1   aspirin EC 81 MG tablet Take 1 tablet (81 mg total) by mouth daily. 90 tablet 3   Bempedoic Acid-Ezetimibe (NEXLIZET) 180-10 MG TABS Take 1 tablet by mouth daily. 90 tablet 3   Blood Glucose Calibration (ACCU-CHEK GUIDE CONTROL) LIQD 1 Bottle by In Vitro route as needed. 1 each 0   Blood Glucose Monitoring Suppl (ACCU-CHEK GUIDE) w/Device KIT 1 each by Does not apply route 4 (four) times daily. Use to test blood sugars 4x a day 1 kit 0   buPROPion (WELLBUTRIN SR) 200 MG 12 hr tablet Take one tablet by mouth once daily. 90 tablet 0   CALCIUM CITRATE-VITAMIN D PO Take 1 tablet by mouth 3 (three) times daily.      capecitabine (XELODA) 500 MG tablet Take 2 tablets (1,000 mg total) by mouth 2 (two) times daily after a meal. Take for 14 days on, 7 days off. Repeat every 21 days. 60 tablet 3   carvedilol (COREG) 6.25 MG tablet Take 1 tablet (6.25 mg total) by mouth 2 (two) times daily. 180 tablet 3   cholecalciferol (VITAMIN D) 1000 units tablet Take 1,000 Units by mouth daily.  Cyanocobalamin (VITAMIN B-12 PO) Take 1 tablet by mouth every 14 (fourteen) days.     glucose blood (ACCU-CHEK GUIDE) test strip Use Accu Chek test strips to check blood sugar 4 times daily. DX:E11.21 400 each 0   insulin aspart (NOVOLOG) 100 UNIT/ML injection Inject into the skin daily as needed for high blood sugar. Per patient, sliding scale with meals as needed     insulin glargine (LANTUS SOLOSTAR) 100 UNIT/ML Solostar Pen Inject 8 Units into the skin daily. 7 mL 3   KERENDIA 20 MG TABS Take 1 tablet by mouth daily. 30 tablet 6   Lactobacillus ([employer] ACIDOPHILUS PROBIOTIC) 0.5 MG TABS Take 1 tablet by mouth daily.     loperamide (IMODIUM) 2 MG  capsule Take 2 mg by mouth as needed for diarrhea or loose stools.     melatonin 5 MG TABS Take 5 mg by mouth at bedtime.     ondansetron (ZOFRAN) 8 MG tablet Take 1 tablet (8 mg total) by mouth every 8 (eight) hours as needed for nausea or vomiting. 30 tablet 2   Pediatric Multivitamins-Iron (FLINTSTONES PLUS IRON) chewable tablet Chew 1 tablet by mouth 2 (two) times daily.     polyethylene glycol (MIRALAX / GLYCOLAX) 17 g packet Take 17 g by mouth daily.     prochlorperazine (COMPAZINE) 10 MG tablet Take 1 tablet (10 mg total) by mouth every 6 (six) hours as needed (Nausea or vomiting). 30 tablet 6   Propylene Glycol (SYSTANE BALANCE OP) Place 1-2 drops into both eyes 3 (three) times daily as needed (for dry eyes).      SURE COMFORT PEN NEEDLES 32G X 4 MM MISC Use as instructed to inject insulin and Victoza daily. 200 each 0   tobramycin (TOBREX) 0.3 % ophthalmic ointment Place 1 application into both eyes as needed (Takes day before eye injections).     vortioxetine HBr (TRINTELLIX) 20 MG TABS tablet Take 1 tablet (20 mg total) by mouth at bedtime. 90 tablet 0   No current facility-administered medications for this visit.   Facility-Administered Medications Ordered in Other Visits  Medication Dose Route Frequency Provider Last Rate Last Admin   acetaminophen (TYLENOL) 325 MG tablet            diphenhydrAMINE (BENADRYL) 50 MG/ML injection            famotidine (PEPCID) 20-0.9 MG/50ML-% IVPB            palonosetron (ALOXI) 0.25 MG/5ML injection             PHYSICAL EXAMINATION: ECOG PERFORMANCE STATUS: {CHL ONC ECOG [phone]}  There were no vitals filed for this visit. There were no vitals filed for this visit.  BREAST:*** No palpable masses or nodules in either right or left breasts. No palpable axillary supraclavicular or infraclavicular adenopathy no breast tenderness or nipple discharge. (exam performed in the presence of a chaperone)  LABORATORY DATA:  I have reviewed the  data as listed    Latest Ref Rng & Units 12/03/2021   10:18 AM 11/19/2021   10:41 AM 11/02/2021    9:29 AM  CMP  Glucose 70 - 99 mg/dL 221  270  126   BUN 8 - 23 mg/dL 42  62  36   Creatinine 0.44 - 1.00 mg/dL 1.94  1.98  1.71   Sodium 135 - 145 mmol/L 138  137  141   Potassium 3.5 - 5.1 mmol/L 4.2  4.4  4.2   Chloride 98 - 111  mmol/L 107  105  109   CO2 22 - 32 mmol/L _0 Calcium 8.9 - 10.3 mg/dL 8.6  8.9  8.9   Total Protein 6.5 - 8.1 g/dL 6.1  6.5  6.1   Total Bilirubin 0.3 - 1.2 mg/dL 0.5  0.5  0.4   Alkaline Phos 38 - 126 U/L 51  69  54   AST 15 - 41 U/L _1 ALT 0 - 44 U/L _2 Lab Results  Component Value Date   WBC 5.4 12/03/2021   HGB 9.5 (L) 12/03/2021   HCT 27.5 (L) 12/03/2021   MCV 99.3 12/03/2021   PLT 197 12/03/2021   NEUTROABS 3.4 12/03/2021    ASSESSMENT & PLAN:  No problem-specific Assessment & Plan notes found for this encounter.    No orders of the defined types were placed in this encounter.  The patient has a good understanding of the overall plan. she agrees with it. she will call with any problems that may develop before the next visit here. Total time spent: 30 mins including face to face time and time spent for planning, charting and co-ordination of care   Suzzette Righter, [place] 12/22/21    I Gardiner Coins am scribing for Dr. Lindi Adie  ***

## 2021-12-24 ENCOUNTER — Ambulatory Visit (INDEPENDENT_AMBULATORY_CARE_PROVIDER_SITE_OTHER): Payer: Medicare PPO | Admitting: Family Medicine

## 2021-12-24 ENCOUNTER — Encounter (HOSPITAL_COMMUNITY): Payer: Self-pay | Admitting: Cardiology

## 2021-12-24 ENCOUNTER — Other Ambulatory Visit (HOSPITAL_COMMUNITY): Payer: Medicare PPO

## 2021-12-24 ENCOUNTER — Encounter (INDEPENDENT_AMBULATORY_CARE_PROVIDER_SITE_OTHER): Payer: Self-pay | Admitting: Family Medicine

## 2021-12-24 ENCOUNTER — Encounter: Payer: Self-pay | Admitting: Internal Medicine

## 2021-12-24 ENCOUNTER — Ambulatory Visit (HOSPITAL_COMMUNITY)
Admission: RE | Admit: 2021-12-24 | Discharge: 2021-12-24 | Disposition: A | Discharge status: Home / Self Care | Payer: Medicare PPO | Source: Ambulatory Visit | Attending: Cardiology | Admitting: Cardiology

## 2021-12-24 ENCOUNTER — Ambulatory Visit (HOSPITAL_BASED_OUTPATIENT_CLINIC_OR_DEPARTMENT_OTHER)
Admission: RE | Admit: 2021-12-24 | Discharge: 2021-12-24 | Disposition: A | Discharge status: Home / Self Care | Payer: Medicare PPO | Source: Ambulatory Visit | Attending: Cardiology | Admitting: Cardiology

## 2021-12-24 VITALS — BP 110/58 | HR 67 | Wt 180.8 lb

## 2021-12-24 VITALS — BP 119/51 | HR 79 | Temp 97.9°F | Ht 69.0 in | Wt 173.0 lb

## 2021-12-24 DIAGNOSIS — N183 Chronic kidney disease, stage 3 unspecified: Secondary | ICD-10-CM | POA: Diagnosis not present

## 2021-12-24 DIAGNOSIS — F3289 Other specified depressive episodes: Secondary | ICD-10-CM

## 2021-12-24 DIAGNOSIS — I2510 Atherosclerotic heart disease of native coronary artery without angina pectoris: Secondary | ICD-10-CM | POA: Insufficient documentation

## 2021-12-24 DIAGNOSIS — Z79899 Other long term (current) drug therapy: Secondary | ICD-10-CM | POA: Insufficient documentation

## 2021-12-24 DIAGNOSIS — Z853 Personal history of malignant neoplasm of breast: Secondary | ICD-10-CM | POA: Insufficient documentation

## 2021-12-24 DIAGNOSIS — I509 Heart failure, unspecified: Secondary | ICD-10-CM | POA: Diagnosis not present

## 2021-12-24 DIAGNOSIS — E669 Obesity, unspecified: Secondary | ICD-10-CM | POA: Diagnosis not present

## 2021-12-24 DIAGNOSIS — Z6825 Body mass index (BMI) 25.0-25.9, adult: Secondary | ICD-10-CM

## 2021-12-24 DIAGNOSIS — G4733 Obstructive sleep apnea (adult) (pediatric): Secondary | ICD-10-CM | POA: Insufficient documentation

## 2021-12-24 DIAGNOSIS — Z794 Long term (current) use of insulin: Secondary | ICD-10-CM

## 2021-12-24 DIAGNOSIS — E119 Type 2 diabetes mellitus without complications: Secondary | ICD-10-CM

## 2021-12-24 DIAGNOSIS — Z9221 Personal history of antineoplastic chemotherapy: Secondary | ICD-10-CM | POA: Insufficient documentation

## 2021-12-24 DIAGNOSIS — Z803 Family history of malignant neoplasm of breast: Secondary | ICD-10-CM | POA: Insufficient documentation

## 2021-12-24 DIAGNOSIS — E1142 Type 2 diabetes mellitus with diabetic polyneuropathy: Secondary | ICD-10-CM | POA: Diagnosis not present

## 2021-12-24 DIAGNOSIS — Z923 Personal history of irradiation: Secondary | ICD-10-CM | POA: Diagnosis not present

## 2021-12-24 DIAGNOSIS — Z1502 Genetic susceptibility to malignant neoplasm of ovary: Secondary | ICD-10-CM | POA: Insufficient documentation

## 2021-12-24 DIAGNOSIS — E1122 Type 2 diabetes mellitus with diabetic chronic kidney disease: Secondary | ICD-10-CM | POA: Diagnosis not present

## 2021-12-24 DIAGNOSIS — Z9011 Acquired absence of right breast and nipple: Secondary | ICD-10-CM | POA: Insufficient documentation

## 2021-12-24 DIAGNOSIS — I130 Hypertensive heart and chronic kidney disease with heart failure and stage 1 through stage 4 chronic kidney disease, or unspecified chronic kidney disease: Secondary | ICD-10-CM | POA: Diagnosis not present

## 2021-12-24 DIAGNOSIS — C50411 Malignant neoplasm of upper-outer quadrant of right female breast: Secondary | ICD-10-CM

## 2021-12-24 DIAGNOSIS — I951 Orthostatic hypotension: Secondary | ICD-10-CM | POA: Insufficient documentation

## 2021-12-24 LAB — BASIC METABOLIC PANEL
Anion gap: 6 (ref 5–15)
BUN: 34 mg/dL — ABNORMAL HIGH (ref 8–23)
CO2: 25 mmol/L (ref 22–32)
Calcium: 8.8 mg/dL — ABNORMAL LOW (ref 8.9–10.3)
Chloride: 109 mmol/L (ref 98–111)
Creatinine, Ser: 1.97 mg/dL — ABNORMAL HIGH (ref 0.44–1.00)
GFR, Estimated: 27 mL/min — ABNORMAL LOW (ref >60)
Glucose, Bld: 149 mg/dL — ABNORMAL HIGH (ref 70–99)
Potassium: 4.8 mmol/L (ref 3.5–5.1)
Sodium: 140 mmol/L (ref 135–145)

## 2021-12-24 LAB — LIPID PANEL
Cholesterol: 114 mg/dL (ref 0–200)
HDL: 54 mg/dL (ref >40)
LDL Cholesterol: 38 mg/dL (ref 0–99)
Total CHOL/HDL Ratio: 2.1 RATIO
Triglycerides: 111 mg/dL (ref <150)
VLDL: 22 mg/dL (ref 0–40)

## 2021-12-24 LAB — ECHOCARDIOGRAM COMPLETE
Area-P 1/2: 2.76 cm2
Calc EF: 45.1 %
Height: 69 in
MV VTI: 1.56 cm2
P 1/2 time: 642 msec
S' Lateral: 3 cm
Single Plane A2C EF: 41.5 %
Single Plane A4C EF: 50.2 %
Weight: 2768 oz

## 2021-12-24 MED ORDER — BUPROPION HCL ER (SR) 200 MG PO TB12: ORAL_TABLET | ORAL | 0 refills | Status: DC

## 2021-12-24 NOTE — Patient Instructions (Signed)
There has been no changes to your medications.    Labs done today, your results will be available in MyChart, we will contact you for abnormal readings.  Your physician recommends that you schedule a follow-up appointment in: 1 year ( December 2024)  ** please call the office in September to arrange your follow up appointment **  If you have any questions or concerns before your next appointment please send us a message through mychart or call our office at [phone].    TO LEAVE A MESSAGE FOR THE NURSE SELECT OPTION 2, PLEASE LEAVE A MESSAGE INCLUDING: YOUR NAME DATE OF BIRTH CALL BACK NUMBER REASON FOR CALL**this is important as we prioritize the call backs  YOU WILL RECEIVE A CALL BACK THE SAME DAY AS LONG AS YOU CALL BEFORE 4:00 PM  At the [facility], you and your health needs are our priority. As part of our continuing mission to provide you with exceptional heart care, we have created designated Provider Care Teams. These Care Teams include your primary Cardiologist (physician) and Advanced Practice Providers (APPs- Physician Assistants and Nurse Practitioners) who all work together to provide you with the care you need, when you need it.   You may see any of the following providers on your designated Care Team at your next follow up: Dr Daniel Bensimhon Dr Dalton McLean Dr. Aditya Sabharwal Amy Clegg, NP Brittainy Simmons, PA Jessica Milford,NP Lindsay Finch, PA Alma Diaz, NP Lauren Kemp, PharmD   Please be sure to bring in all your medications bottles to every appointment.    

## 2021-12-24 NOTE — Progress Notes (Signed)
  Echocardiogram 2D Echocardiogram has been performed.  Darlene [name] 12/24/2021, 1:57 PM

## 2021-12-25 ENCOUNTER — Inpatient Hospital Stay: Payer: Medicare PPO

## 2021-12-25 ENCOUNTER — Inpatient Hospital Stay: Payer: Medicare PPO | Attending: Oncology | Admitting: Hematology and Oncology

## 2021-12-25 VITALS — BP 136/55 | HR 87 | Temp 97.5°F | Resp 18 | Ht 69.0 in | Wt 180.1 lb

## 2021-12-25 DIAGNOSIS — C787 Secondary malignant neoplasm of liver and intrahepatic bile duct: Secondary | ICD-10-CM | POA: Diagnosis not present

## 2021-12-25 DIAGNOSIS — C50411 Malignant neoplasm of upper-outer quadrant of right female breast: Secondary | ICD-10-CM

## 2021-12-25 DIAGNOSIS — Z171 Estrogen receptor negative status [ER-]: Secondary | ICD-10-CM

## 2021-12-25 LAB — CBC WITH DIFFERENTIAL (CANCER CENTER ONLY)
Abs Immature Granulocytes: 0.02 10*3/uL (ref 0.00–0.07)
Basophils Absolute: 0 10*3/uL (ref 0.0–0.1)
Basophils Relative: 1 %
Eosinophils Absolute: 0.2 10*3/uL (ref 0.0–0.5)
Eosinophils Relative: 5 %
HCT: 27.4 % — ABNORMAL LOW (ref 36.0–46.0)
Hemoglobin: 9.3 g/dL — ABNORMAL LOW (ref 12.0–15.0)
Immature Granulocytes: 1 %
Lymphocytes Relative: 16 %
Lymphs Abs: 0.7 10*3/uL (ref 0.7–4.0)
MCH: 36.2 pg — ABNORMAL HIGH (ref 26.0–34.0)
MCHC: 33.9 g/dL (ref 30.0–36.0)
MCV: 106.6 fL — ABNORMAL HIGH (ref 80.0–100.0)
Monocytes Absolute: 0.6 10*3/uL (ref 0.1–1.0)
Monocytes Relative: 14 %
Neutro Abs: 2.8 10*3/uL (ref 1.7–7.7)
Neutrophils Relative %: 63 %
Platelet Count: 191 10*3/uL (ref 150–400)
RBC: 2.57 MIL/uL — ABNORMAL LOW (ref 3.87–5.11)
RDW: 20.1 % — ABNORMAL HIGH (ref 11.5–15.5)
WBC Count: 4.3 10*3/uL (ref 4.0–10.5)
nRBC: 0 % (ref 0.0–0.2)

## 2021-12-25 LAB — CMP (CANCER CENTER ONLY)
ALT: 11 U/L (ref 0–44)
AST: 31 U/L (ref 15–41)
Albumin: 3.7 g/dL (ref 3.5–5.0)
Alkaline Phosphatase: 67 U/L (ref 38–126)
Anion gap: 7 (ref 5–15)
BUN: 35 mg/dL — ABNORMAL HIGH (ref 8–23)
CO2: 26 mmol/L (ref 22–32)
Calcium: 9.2 mg/dL (ref 8.9–10.3)
Chloride: 108 mmol/L (ref 98–111)
Creatinine: 1.7 mg/dL — ABNORMAL HIGH (ref 0.44–1.00)
GFR, Estimated: 32 mL/min — ABNORMAL LOW (ref >60)
Glucose, Bld: 136 mg/dL — ABNORMAL HIGH (ref 70–99)
Potassium: 4.6 mmol/L (ref 3.5–5.1)
Sodium: 141 mmol/L (ref 135–145)
Total Bilirubin: 0.7 mg/dL (ref 0.3–1.2)
Total Protein: 6 g/dL — ABNORMAL LOW (ref 6.5–8.1)

## 2021-12-25 LAB — CEA (IN HOUSE-CHCC): CEA (CHCC-In House): 2001.75 ng/mL — ABNORMAL HIGH (ref 0.00–5.00)

## 2021-12-25 NOTE — Progress Notes (Signed)
Patient ID: Darlene [name], female   DOB: 1950/06/29, 71 y.o.   MRN: [id number] Oncologist: Dr Lindi Adie   HPI: Darlene [name] is a 71 y.o.with history of R breast cancer, right axillary lymph node biopsy 07/19/2014 for a cT2  pN1, stage IIB  invasive ductal carcinoma, grade 2, estrogen and progesterone receptor negative, with an MIB-1 of 90%, and HER-2 positive , OSA -> CPAP, bariatric surgery, and DM, referred to cardio-oncology clinic by Dr Jana Hakim  Completed neoadjuvant chemotherapy with carboplatin, docetaxel, trastuzumab and pertuzumab given every 3 weeks 6. Trastuzumab + capecitabine were completed in 9/17.  She had lumpectomy 12/16 and had mastectomy in 1/17.  She has completed radiation.   She was found in 11/17 to have a recurrence by PET => right retropectoral lymph node enlarged. In 2/18, she started on treatment with ado-trastuzumab emtansine, now back on Herceptin.  Echo prior to treatment showed stable EF.  Starting in 6/19, she was transitioned to [facility].   She is now off Kadcyla and back on Herceptin/Perjeta for use long-term.     Echo in 2/21 showed EF 55-60% with more negative strain.   Coronary artery calcium score was done in 2/21, this showed CAC 3653 Agatston units, placing her in the 99th percentile. She was started on a Crestor but LFTs rose.  She had RUQ [name] without significant abnormality.  Hepatitis labs were negative.  The statin was stopped.  Cardiolite in 3/21 showed EF 51%, no ischemia/infarction.  She ended up on bempedoic acid.   Echo in 6/21 showed EF 55-60% with GLS -17.6%.  Echo today showed EF 55-60%, GLS -15.8%, normal RV.  Echo in 2/22 showed EF 55-60% with GLS -27.5%.  Echo in 9/22 showed EF 55% but strain analysis was not done.   She was found to have liver lesion concerning for metastatic disease.  She was started on Fam-trastuzumab deruxtecan (Enhertu).  Echo in 6/23 showed EF 55-60%, GLS -17.8%, RV normal, IVC normal.     Echo today was reviewed, EF  55-60%, GLS -17.7%, normal RV.   She comes in today for followup of CAD and chemotherapy.  She is now off Enhertu and has started Xeloda.  Main complaint is tingling/pain in feet from peripheral neuropathy.  No significant exertional dyspnea or chest pain.  Has some trouble with balance likely from neuropathy.    ECG (personally reviewed): NSR, normal  Labs (1/21): LDL 75 Labs (4/21): LDL 42, AST 70 => 125, ALT 117 => 255 Labs (10/21): LDL 18, HDL 58,  K 3.8, creatinine 1.88 Labs (1/22): K 4.1, creatinine 1.85 Labs (9/22): K 4.4, creatinine 2.38, LDL 26 Labs (6/23): K 4.7, creatinine 2.02 Labs (11/23): K 4.2, creatinine 1.04, hgb 9.5  Echo (7/16) with EF 60-65%, grade II diastolic dysfunction.  Echo (10/16) with EF 66%, GLS -17%. Echo (1/17) with EF 60-65%, grade II diastolic dysfunction, lateral s' 10, GLS -18.8% Echo (4/17) with EF 60-65%, grade II diastolic dysfunction, lateral s' 9.5, strain poorly traced and off axis, normal RV size and systolic function.  Echo (6/17) with EF 55-60%, aortic sclerosis without stenosis, difficult images for strain, probably not accurate.  Echo (10/17) with EF 55-60%, mild LVH, moderate diastolic dysfunction, normal RV size and systolic function.  Echo (2/18) with EF 55-60%, GLS -15% but technically difficult study Echo (6/18) with EF 60-65%, GLS -15% but technically difficult study.  Echo (9/18) with EF 55-60%, GLS -19.8%, grade II diastolic dysfunction.  Echo (1/19) with EF 55-60%, normal RV size and  systolic function, GLS -38.3% (done with different machine).  Echo (4/19) with EF 55-60%, mild LVH, GLS -17% Echo (9/19) with EF 55-60%, GLS -17.1%, normal RV size and systolic function.  Echo (8/20): EF 50-55%.  Echo (11/20): EF 50-55%, GLS -12.7% (not ideal tracing), mildly decreased RV systolic function.   Echo (2/21): EF 55-60%, GLS -17.9%, grade II diastolic dysfunction, normal RV Echo (6/21): EF 55-60% with GLS -17.6% Echo (10/21): EF 55-60%,  normal RV, GLS -15.8%.  Echo (2/22): EF 55-60% with GLS -27.5%. Echo (9/22): EF 55%, strain analysis not done.  Echo (12/22): EF 55-60%, normal RV, strain images suboptimal. Echo (6/23): EF 55-60%, GLS -17.8%, RV normal, IVC normal Echo (12/23): EF 55-60%, GLS -17.7%, normal RV.   Review of Systems: All systems reviewed and negative except as per HPI.   Past Medical History:  Diagnosis Date   Anemia    hx of - during 1st round chemo   Anxiety    Arthritis    in fingers, shoulders   Back pain    Balance problem    Breast cancer ([place])    Breast cancer of upper-outer quadrant of right female breast ([place]) 07/22/2014   Bunion, right foot    CHF (congestive heart failure) (HCC)    Chronic kidney disease    creatininei levels high per pt    Clumsiness    Constipation    Depression    Diabetes mellitus without complication ([place])    33+ years type 2    Diabetic retinopathy ([place])    torn retina   Eating disorder    binge eating   Epistaxis 08/26/2014   Fatty liver    Foot drop, left foot    HCAP (healthcare-associated pneumonia) 12/18/2014   Heart murmur    never had any problems   History of radiation therapy 04/05/15-05/19/15   right chest wall was treated to 50.4 Gy in 28 fractions, right mastectomy scar was treated to 10 Gy in 5 fractions   Hyperlipidemia    Hypertension    Joint pain    Multiple food allergies    Neuromuscular disorder ([place])    diabetic neuropathy   Obesity    Obstructive sleep apnea on CPAP    does not use cpap all the time has lost 160 lbs    Ovarian cyst rupture    possible   Pneumonia    Pneumonia 11/2014   Radiation 04/05/15-05/19/15   right chest wall 50.4 Gy, mastectomy scar 10 Gy   Sleep apnea    Thyroid nodule 06/2014    Current Outpatient Medications  Medication Sig Dispense Refill   Accu-Chek Softclix Lancets lancets 1 each by Other route 4 (four) times daily. Test blood sugars 4 x times daily 200 each 1   aspirin EC 81 MG tablet Take 1  tablet (81 mg total) by mouth daily. 90 tablet 3   Bempedoic Acid-Ezetimibe (NEXLIZET) 180-10 MG TABS Take 1 tablet by mouth daily. 90 tablet 3   Blood Glucose Calibration (ACCU-CHEK GUIDE CONTROL) LIQD 1 Bottle by In Vitro route as needed. 1 each 0   Blood Glucose Monitoring Suppl (ACCU-CHEK GUIDE) w/Device KIT 1 each by Does not apply route 4 (four) times daily. Use to test blood sugars 4x a day 1 kit 0   buPROPion (WELLBUTRIN SR) 200 MG 12 hr tablet Take one tablet by mouth once daily. 90 tablet 0   CALCIUM CITRATE-VITAMIN D PO Take 1 tablet by mouth 3 (three) times daily.  capecitabine (XELODA) 500 MG tablet Take 2 tablets (1,000 mg total) by mouth 2 (two) times daily after a meal. Take for 14 days on, 7 days off. Repeat every 21 days. 60 tablet 3   carvedilol (COREG) 6.25 MG tablet Take 1 tablet (6.25 mg total) by mouth 2 (two) times daily. 180 tablet 3   cholecalciferol (VITAMIN D) 1000 units tablet Take 1,000 Units by mouth daily.     Cyanocobalamin (VITAMIN B-12 PO) Take 1 tablet by mouth every 14 (fourteen) days.     glucose blood (ACCU-CHEK GUIDE) test strip Use Accu Chek test strips to check blood sugar 4 times daily. DX:E11.21 400 each 0   insulin aspart (NOVOLOG) 100 UNIT/ML injection Inject into the skin daily as needed for high blood sugar. Per patient, sliding scale with meals as needed     insulin glargine (LANTUS SOLOSTAR) 100 UNIT/ML Solostar Pen Inject 8 Units into the skin daily. 7 mL 3   KERENDIA 20 MG TABS Take 1 tablet by mouth daily. 30 tablet 6   Lactobacillus ([employer]S ACIDOPHILUS PROBIOTIC) 0.5 MG TABS Take 1 tablet by mouth daily.     loperamide (IMODIUM) 2 MG capsule Take 2 mg by mouth as needed for diarrhea or loose stools.     melatonin 5 MG TABS Take 5 mg by mouth at bedtime.     ondansetron (ZOFRAN) 8 MG tablet Take 1 tablet (8 mg total) by mouth every 8 (eight) hours as needed for nausea or vomiting. 30 tablet 2   Pediatric Multivitamins-Iron (FLINTSTONES PLUS  IRON) chewable tablet Chew 1 tablet by mouth 2 (two) times daily.     polyethylene glycol (MIRALAX / GLYCOLAX) 17 g packet Take 17 g by mouth daily.     prochlorperazine (COMPAZINE) 10 MG tablet Take 1 tablet (10 mg total) by mouth every 6 (six) hours as needed (Nausea or vomiting). 30 tablet 6   Propylene Glycol (SYSTANE BALANCE OP) Place 1-2 drops into both eyes 3 (three) times daily as needed (for dry eyes).      SURE COMFORT PEN NEEDLES 32G X 4 MM MISC Use as instructed to inject insulin and Victoza daily. 200 each 0   tobramycin (TOBREX) 0.3 % ophthalmic ointment Place 1 application into both eyes as needed (Takes day before eye injections).     vortioxetine HBr (TRINTELLIX) 20 MG TABS tablet Take 1 tablet (20 mg total) by mouth at bedtime. 90 tablet 0   No current facility-administered medications for this encounter.   Facility-Administered Medications Ordered in Other Encounters  Medication Dose Route Frequency Provider Last Rate Last Admin   acetaminophen (TYLENOL) 325 MG tablet            diphenhydrAMINE (BENADRYL) 50 MG/ML injection            famotidine (PEPCID) 20-0.9 MG/50ML-% IVPB            palonosetron (ALOXI) 0.25 MG/5ML injection             Allergies  Allergen Reactions   Antihistamines, Diphenhydramine-Type Other (See Comments)    IV diphenhydramine causes sedation.   Nsaids Other (See Comments)    H/o gastric bypass - avoid NSAIDs   Tape Hives, Rash and Other (See Comments)    Adhesives in bandaids      Social History   Socioeconomic History   Marital status: Married    Spouse name: [name]   Number of children: 0   Years of education: Bachelor's   Highest education level: Bachelor's  degree (e.g., BA, AB, BS)  Occupational History   Occupation: Pensions consultant: [employer]  Tobacco Use   Smoking status: Never   Smokeless tobacco: Never  Vaping Use   Vaping Use: Never used  Substance and Sexual Activity   Alcohol use: Yes     Alcohol/week: 1.0 - 2.0 standard drink of alcohol    Types: 1 - 2 Standard drinks or equivalent per week    Comment: social   Drug use: No   Sexual activity: Yes    Partners: Male    Birth control/protection: Post-menopausal, Other-see comments    Comment: husband vasectomy  Other Topics Concern   Not on file  Social History Narrative   Patient is married Health and safety inspector) and lives at home with her husband.   Patient does not have any children.   Patient is working for a [employer].   Patient has a Bachelor's degree.   Patient is right-handed.   Patient does not drink any caffeine.      Right breast removed   Social Determinants of Health   Financial Resource Strain: Low Risk  (05/19/2021)   Overall Financial Resource Strain (CARDIA)    Difficulty of Paying Living Expenses: Not hard at all  Food Insecurity: No Food Insecurity (05/19/2021)   Hunger Vital Sign    Worried About Running Out of Food in the Last Year: Never true    Ran Out of Food in the Last Year: Never true  Transportation Needs: No Transportation Needs (05/19/2021)   PRAPARE - Hydrologist (Medical): No    Lack of Transportation (Non-Medical): No  Physical Activity: Unknown (05/19/2021)   Exercise Vital Sign    Days of Exercise per Week: 1 day    Minutes of Exercise per Session: Not on file  Stress: No Stress Concern Present (05/19/2021)   [place]    Feeling of Stress : Not at all  Social Connections: [place] (05/19/2021)   Social Connection and Isolation Panel [NHANES]    Frequency of Communication with Friends and Family: Three times a week    Frequency of Social Gatherings with Friends and Family: Twice a week    Attends Religious Services: 1 to 4 times per year    Active Member of [employer] or Organizations: Yes    Attends Music therapist: More than 4 times per year    Marital Status: Married   Human resources officer Violence: Not on file      Family History  Problem Relation Age of Onset   CVA Mother    Heart disease Mother    Sudden death Mother    Diabetes Father    Heart failure Father    Hypertension Father    Kidney disease Father    Obesity Father    Hypertension Sister    Diabetes Brother    Breast cancer Paternal Grandmother 15   Diabetes Paternal Uncle    Ovarian cancer Neg Hx     Vitals:   12/24/21 1426  BP: (!) 110/58  Pulse: 67  SpO2: 99%  Weight: 82 kg (180 lb 12.8 oz)    PHYSICAL EXAM: General: NAD Neck: No JVD, no thyromegaly or thyroid nodule.  Lungs: Clear to auscultation bilaterally with normal respiratory effort. CV: Nondisplaced PMI.  Heart regular S1/S2, no S3/S4, no murmur.  No peripheral edema.  No carotid bruit.  Normal pedal pulses.  Abdomen: Soft, nontender,  no hepatosplenomegaly, no distention.  Skin: Intact without lesions or rashes.  Neurologic: Alert and oriented x 3.  Psych: Normal affect. Extremities: No clubbing or cyanosis.  HEENT: Normal.   ASSESSMENT & PLAN:  1. Breast cancer: Metastatic.  Recently stopped Fam-trastuzumab deruxtecan (Enhertu) and started Xeloda.  Echo today shows stable EF 55-60%, GLS mildly low at -17.7% but comparable to prior studies.    - Continue Coreg 6.25 mg bid.  - She is no longer taking an agent of concern for cardiotoxicity.  I think that we can stop her screening echoes at this point unless she were to restart one of these agents in the future.  2. HTN: BP controlled.  3. OSA: Continue nightly CPAP   4. CAD: Coronary artery calcium score was elevated, 3653 Agatston units, placing her in the 99th percentile for her age and gender.  This suggests high risk for future cardiac events.  She has no chest pain.  Cardiolite 3/21 with no ischemia/infarction.  Rise in LFTs with Crestor. Unable to obtain Repatha for her.  - She is now on Nexlizet, check lipids today.   - Continue ASA 81, beta blocker.  5.  CKD stage 3: Suspect diabetic nephropathy.  Last creatinine 1.94.   - Off dapagliflozin with orthostasis/hypotension, resolved.  - Tolerating finerenone 20 mg daily. BMET today   Followup in 1 year  Loralie Champagne 12/25/2021

## 2021-12-25 NOTE — Assessment & Plan Note (Signed)
07/19/2014: Right breast cancer grade 2 IDC ER/PR negative HER2 positive Ki-67 90% treated with neoadjuvant chemo with TCHP x6 cycles followed by Herceptin maintenance, right lumpectomy had residual disease 4 cm 1/3 lymph node positive, ER/PR negative, adjuvant radiation and capecitabine completed 09/27/2015 12/06/2015: Recurrence: Right subpectoral lymph node and subcutaneous nodule, liver mass (biopsy adenocarcinoma consistent with upper GI or pancreaticobiliary primary, HER2 negative, ER negative) July 2019: Metastatic disease: TDM 1 followed by Herceptin maintenance, pertuzumab added 10/06/2018, Abraxane 12/31/2017-09/01/2018, Herceptin and pertuzumab maintenance   Prior treatment: Enhertu started 10/05/2020 switched to Herceptin maintenance 02/19/2021 switched to Enhertu at higher dosage x 3 cycles discontinued 08/01/2021    02/04/2021: PET CT scan: Complete metabolic response, no evidence of hypermetabolic disease in the liver (mass measured 2.7 cm previously is now 1.5 cm with no uptake)    11/02/2021: CT CAP: Increase in the hepatic metastatic lesions (7.5 cm to 8.3 cm) similar size of aortocaval, periportal, gastrohepatic lymph nodes ------------------------------------------------------------------------------------------------------------------------------------------- Current treatment: Xeloda 14 days 7 days off started 11/12/2021   Xeloda toxicities: Intermittent diarrhea Nausea and vomiting I reduced the dosage of Xeloda to 2 tablets in the morning and 2 tablets in the evening 

## 2021-12-26 ENCOUNTER — Other Ambulatory Visit (INDEPENDENT_AMBULATORY_CARE_PROVIDER_SITE_OTHER): Payer: Self-pay | Admitting: Family Medicine

## 2021-12-26 ENCOUNTER — Encounter: Payer: Self-pay | Admitting: Hematology and Oncology

## 2021-12-26 ENCOUNTER — Encounter (INDEPENDENT_AMBULATORY_CARE_PROVIDER_SITE_OTHER): Payer: Self-pay | Admitting: Family Medicine

## 2021-12-26 DIAGNOSIS — F3289 Other specified depressive episodes: Secondary | ICD-10-CM

## 2021-12-27 ENCOUNTER — Ambulatory Visit (INDEPENDENT_AMBULATORY_CARE_PROVIDER_SITE_OTHER): Payer: Medicare PPO | Admitting: Family Medicine

## 2021-12-28 DIAGNOSIS — C50911 Malignant neoplasm of unspecified site of right female breast: Secondary | ICD-10-CM | POA: Diagnosis not present

## 2021-12-28 DIAGNOSIS — E1165 Type 2 diabetes mellitus with hyperglycemia: Secondary | ICD-10-CM | POA: Diagnosis not present

## 2021-12-28 DIAGNOSIS — I10 Essential (primary) hypertension: Secondary | ICD-10-CM | POA: Diagnosis not present

## 2021-12-28 DIAGNOSIS — Z23 Encounter for immunization: Secondary | ICD-10-CM | POA: Diagnosis not present

## 2021-12-28 DIAGNOSIS — E7800 Pure hypercholesterolemia, unspecified: Secondary | ICD-10-CM | POA: Diagnosis not present

## 2021-12-28 DIAGNOSIS — N182 Chronic kidney disease, stage 2 (mild): Secondary | ICD-10-CM | POA: Diagnosis not present

## 2021-12-29 ENCOUNTER — Encounter (INDEPENDENT_AMBULATORY_CARE_PROVIDER_SITE_OTHER): Payer: Self-pay | Admitting: Family Medicine

## 2021-12-29 DIAGNOSIS — F3289 Other specified depressive episodes: Secondary | ICD-10-CM

## 2021-12-31 ENCOUNTER — Telehealth (INDEPENDENT_AMBULATORY_CARE_PROVIDER_SITE_OTHER): Payer: Self-pay | Admitting: Family Medicine

## 2021-12-31 NOTE — Telephone Encounter (Signed)
[place] called to check to see if patient is still on Trintellix. She needs someone to call her back at [phone] Nira Conn there until 2)

## 2022-01-01 ENCOUNTER — Other Ambulatory Visit (INDEPENDENT_AMBULATORY_CARE_PROVIDER_SITE_OTHER): Payer: Self-pay | Admitting: Family Medicine

## 2022-01-01 DIAGNOSIS — F3289 Other specified depressive episodes: Secondary | ICD-10-CM

## 2022-01-01 DIAGNOSIS — D0421 Carcinoma in situ of skin of right ear and external auricular canal: Secondary | ICD-10-CM | POA: Diagnosis not present

## 2022-01-02 NOTE — Progress Notes (Unsigned)
Chief Complaint:   OBESITY Darlene [name] is here to discuss her progress with her obesity treatment plan along with follow-up of her obesity related diagnoses. Darlene [name] is on keeping a food journal and adhering to recommended goals of 1800 calories and 100 grams of protein and states she is following her eating plan approximately 60% of the time. Darlene [name] states she is doing 0 minutes 0 times per week.  Today's visit was #: 11 Starting weight: 225 lbs Starting date: 08/09/2016 Today's weight: 173 lbs Today's date: 12/24/2021 Total lbs lost to date: 52 Total lbs lost since last in-office visit: 3  Interim History: Darlene [name] has had extra challenges recently, and she is going through chemotherapy and she has had increase nausea. She is working on increasing her protein.   Subjective:   1. Type 2 diabetes mellitus without complication, with long-term current use of insulin (HCC) Darlene [name]'s last A1c was elevated at 7.4. She has been on steroids and she hasn't been able  to eat as well. She has no signs of hypoglycemia.   2. Other depression with emotional eating Darlene [name] is stable on her medications, and she requests a refill today. Her mood has fluctuated due to her chemo side effects, but this has improved as she feels better.   Assessment/Plan:   1. Type 2 diabetes mellitus without complication, with long-term current use of insulin (HCC) Darlene [name] will continue to work on decreasing simple carbohydrates as she feels better, will consider restarting physical therapy to help increase activity.   2. Other depression with emotional eating Darlene [name] will continue Wellbutrin SR 200 mg once daily, and we will refill for 90 days.   - buPROPion (WELLBUTRIN SR) 200 MG 12 hr tablet; Take one tablet by mouth once daily.  Dispense: 90 tablet; Refill: 0  3. Obesity, Current BMI 25.6 Darlene [name] is currently in the action stage of change. As such, her goal is to continue with weight loss efforts. She  has agreed to keeping a food journal and adhering to recommended goals of 1800 calories and 100+ grams of protein daily.   Protein drinks are ok to add, recommended Fairlife protein shake.   Exercise goals: Increase walking.   Behavioral modification strategies: increasing lean protein intake, no skipping meals, and holiday eating strategies .  Darlene [name] has agreed to follow-up with our clinic in 8 weeks. She was informed of the importance of frequent follow-up visits to maximize her success with intensive lifestyle modifications for her multiple health conditions.   Objective:   Blood pressure (!) 119/51, pulse 79, temperature 97.9 F (36.6 C), height '5\' 9"'$  (1.753 m), weight 173 lb (78.5 kg), last menstrual period 10/22/2007, SpO2 95 %. Body mass index is 25.55 kg/m.  General: Cooperative, alert, well developed, in no acute distress. HEENT: Conjunctivae and lids unremarkable. Cardiovascular: Regular rhythm.  Lungs: Normal work of breathing. Neurologic: No focal deficits.   Lab Results  Component Value Date   CREATININE 1.70 (H) 12/25/2021   BUN 35 (H) 12/25/2021   NA 141 12/25/2021   K 4.6 12/25/2021   CL 108 12/25/2021   CO2 26 12/25/2021   Lab Results  Component Value Date   ALT 11 12/25/2021   AST 31 12/25/2021   ALKPHOS 67 12/25/2021   BILITOT 0.7 12/25/2021   Lab Results  Component Value Date   HGBA1C 7.4 (H) 12/19/2021   HGBA1C 5.9 (A) 05/23/2021   HGBA1C 6.3 (A) 11/23/2020   HGBA1C 5.3 07/04/2020   HGBA1C 5.2 01/04/2020  Lab Results  Component Value Date   INSULIN 7.6 01/25/2019   INSULIN 15.3 03/11/2017   INSULIN 18.3 08/09/2016   Lab Results  Component Value Date   TSH 1.45 04/11/2021   Lab Results  Component Value Date   CHOL 114 12/24/2021   HDL 54 12/24/2021   LDLCALC 38 12/24/2021   TRIG 111 12/24/2021   CHOLHDL 2.1 12/24/2021   Lab Results  Component Value Date   VD25OH 63.18 12/19/2021   VD25OH 49.3 01/04/2020   VD25OH 43.6  10/05/2019   Lab Results  Component Value Date   WBC 4.3 12/25/2021   HGB 9.3 (L) 12/25/2021   HCT 27.4 (L) 12/25/2021   MCV 106.6 (H) 12/25/2021   PLT 191 12/25/2021   Lab Results  Component Value Date   IRON 58 02/18/2013   TIBC 305 02/18/2013   FERRITIN 133 10/31/2014   Attestation Statements:   Reviewed by clinician on day of visit: allergies, medications, problem list, medical history, surgical history, family history, social history, and previous encounter notes.   I, [name], am acting as transcriptionist for Dennard Nip, MD.  I have reviewed the above documentation for accuracy and completeness, and I agree with the above. -  Dennard Nip, MD

## 2022-01-03 ENCOUNTER — Other Ambulatory Visit (HOSPITAL_COMMUNITY): Payer: Self-pay

## 2022-01-03 DIAGNOSIS — C50911 Malignant neoplasm of unspecified site of right female breast: Secondary | ICD-10-CM | POA: Diagnosis not present

## 2022-01-03 MED ORDER — VORTIOXETINE HBR 20 MG PO TABS: 20.0000 mg | ORAL_TABLET | Freq: Every day | ORAL | 0 refills | Status: DC

## 2022-01-03 NOTE — Telephone Encounter (Signed)
Ok to rf x 1

## 2022-01-03 NOTE — Telephone Encounter (Signed)
LAST APPOINTMENT DATE: 12/24/21 NEXT APPOINTMENT DATE: 02/26/21   [facility] [place] [place] [address] Phone: [phone] Fax: [place] [address] Phone: [phone] Fax: [phone]  [facility] [place], [place] [address] Phone: [phone] Fax: [phone]  Patient is requesting a refill of the following medications: Requested Prescriptions   Pending Prescriptions Disp Refills   vortioxetine HBr (TRINTELLIX) 20 MG TABS tablet 90 tablet 0    Sig: Take 1 tablet (20 mg total) by mouth at bedtime.    Date last filled: 07/31/21 Previously prescribed by [facility]  Lab Results  Component Value Date   HGBA1C 7.4 (H) 12/19/2021   HGBA1C 5.9 (A) 05/23/2021   HGBA1C 6.3 (A) 11/23/2020   Lab Results  Component Value Date   MICROALBUR 0.9 05/23/2021   LDLCALC 38 12/24/2021   CREATININE 1.70 (H) 12/25/2021   Lab Results  Component Value Date   VD25OH 63.18 12/19/2021   VD25OH 49.3 01/04/2020   VD25OH 43.6 10/05/2019    BP Readings from Last 3 Encounters:  12/25/21 (!) 136/55  12/24/21 (!) 110/58  12/24/21 (!) 119/51

## 2022-01-03 NOTE — Telephone Encounter (Signed)
[place] Per Dr. Leafy Ro to send refill in for patients trintellix. [facility]. Patient has been notified.

## 2022-01-04 ENCOUNTER — Other Ambulatory Visit: Payer: Self-pay

## 2022-01-04 ENCOUNTER — Encounter: Payer: Self-pay | Admitting: *Deleted

## 2022-01-04 NOTE — Progress Notes (Signed)
Per MD request, due to pt increase in CEA numbers, pt needing to have CT CAP prior to expected date of February.  PA message sent and appt scheduled.  Pt notified of appt date and time.

## 2022-01-17 ENCOUNTER — Ambulatory Visit (HOSPITAL_COMMUNITY)
Admission: RE | Admit: 2022-01-17 | Discharge: 2022-01-17 | Disposition: A | Discharge status: Home / Self Care | Payer: Medicare PPO | Source: Ambulatory Visit | Attending: Hematology and Oncology | Admitting: Hematology and Oncology

## 2022-01-17 DIAGNOSIS — C50411 Malignant neoplasm of upper-outer quadrant of right female breast: Secondary | ICD-10-CM | POA: Diagnosis not present

## 2022-01-17 DIAGNOSIS — Z171 Estrogen receptor negative status [ER-]: Secondary | ICD-10-CM | POA: Diagnosis not present

## 2022-01-17 DIAGNOSIS — J8410 Pulmonary fibrosis, unspecified: Secondary | ICD-10-CM | POA: Diagnosis not present

## 2022-01-17 DIAGNOSIS — C801 Malignant (primary) neoplasm, unspecified: Secondary | ICD-10-CM | POA: Diagnosis not present

## 2022-01-17 DIAGNOSIS — N200 Calculus of kidney: Secondary | ICD-10-CM | POA: Diagnosis not present

## 2022-01-17 DIAGNOSIS — K8020 Calculus of gallbladder without cholecystitis without obstruction: Secondary | ICD-10-CM | POA: Diagnosis not present

## 2022-01-17 DIAGNOSIS — C787 Secondary malignant neoplasm of liver and intrahepatic bile duct: Secondary | ICD-10-CM | POA: Diagnosis not present

## 2022-01-17 LAB — POCT I-STAT CREATININE: Creatinine, Ser: 1.8 mg/dL — ABNORMAL HIGH (ref 0.44–1.00)

## 2022-01-17 MED ORDER — IOHEXOL 9 MG/ML PO SOLN
ORAL | Status: AC
  Filled 2022-01-17: qty 1000

## 2022-01-17 MED ORDER — IOHEXOL 9 MG/ML PO SOLN
1000.0000 mL | ORAL | Status: AC
  Administered 2022-01-17: 1000 mL via ORAL

## 2022-01-17 MED ORDER — SODIUM CHLORIDE (PF) 0.9 % IJ SOLN
INTRAMUSCULAR | Status: AC
  Filled 2022-01-17: qty 50

## 2022-01-17 MED ORDER — IOHEXOL 300 MG/ML  SOLN
75.0000 mL | Freq: Once | INTRAMUSCULAR | Status: AC | PRN
  Administered 2022-01-17: 75 mL via INTRAVENOUS

## 2022-01-18 DIAGNOSIS — C50911 Malignant neoplasm of unspecified site of right female breast: Secondary | ICD-10-CM | POA: Diagnosis not present

## 2022-01-19 NOTE — Progress Notes (Incomplete)
Patient Care Team: Isaac Bliss, Rayford Halsted, MD as PCP - General (Internal Medicine) Larey Dresser, MD as PCP - Advanced Heart Failure (Cardiology) Stark Klein, MD as Consulting Physician (General Surgery) Jacelyn Pi, MD as Consulting Physician (Endocrinology) [name], MD as Consulting Physician (Plastic Surgery) Gery Pray, MD as Consulting Physician (Radiation Oncology) Juanita Craver, MD as Consulting Physician (Gastroenterology) Alda Berthold, DO as Consulting Physician (Neurology) Starlyn Skeans, MD as Consulting Physician (Family Medicine) Sherlynn Stalls, MD as Consulting Physician (Ophthalmology) Salvadore Dom, MD as Consulting Physician (Obstetrics and Gynecology) Raina Mina, RPH-CPP (Pharmacist) Nicholas Lose, MD as Consulting Physician (Hematology and Oncology)  DIAGNOSIS: No diagnosis found.  SUMMARY OF ONCOLOGIC HISTORY: Oncology History  Breast cancer of upper-outer quadrant of right female breast ([place])  07/19/2014 Initial Biopsy   (R) breast needle biopsy (10:00): IDC, DCIS, grade 2. ER-, PR-, HER2+ (ratio 2.42). Ki67 90%.    07/22/2014 Initial Diagnosis   Breast cancer of upper-outer quadrant of right female breast   08/08/2014 - 11/21/2014 Neo-Adjuvant Chemotherapy   Taxotere/Carbo/Herceptin/Perjeta x 6 cycles.    12/12/2014 - 10/02/2015 Chemotherapy   Maintenance Herceptin (to complete 1 year of therapy).    12/27/2014 Surgery   (R) lumpectomy with [facility]): IDC, grade 2, spanning 4 cm, associated high grade DCIS. Negative margins. 1/3 SLN (+).   ER/PR repeated and remain negative.   ypT2, ypN1a: Stage IIB   01/06/2015 Surgery   Bilateral breast mammoplasty ([place]): Right breast path with intralymphatic emboli of ductal carcinoma. Left breast benign.    02/02/2015 Imaging   CT chest: No findings of metastatic disease in the chest. Right axillary fluid density lesion likely postoperative seroma or hematoma. Small  left subpectoral nodes warrant followup attention.   02/02/2015 Imaging   Bone scan: No scintigraphic evidence of osseous metastatic disease   02/16/2015 Surgery   (R) mastectomy and ALND [facility]): Scattered foci of high grade DCIS, scattered intralymphatic tumor emboli. Margins negative. 0/15 LNs.    04/05/2015 - 05/19/2015 Radiation Therapy   Adjuvant XRT ([place]). The right chest wall was treated to 50.4 Gy in 28 fractions at 1.8 Gy per fraction.  The right mastectomy scar was treated to 10 Gy in 5 fractions at 2 Gy per fraction.    12/06/2015 PET scan   Right subpectoral lymph node measuring 9 mm and exhibits malignant range FDG uptake, suspicious for metastatic adenopathy. Subcutaneous nodule within the upper left chest wall exhibits malignant range uptake, which is indeterminate; this is at the site of previous Port-A-Cath and is favored to represent postsurgical change. Indeterminate left adrenal nodules which exhibit mild uptake, favored to represent benign adenoma.   08/11/2017 - 11/03/2017 Chemotherapy   The patient had ado-trastuzumab emtansine (KADCYLA) 320 mg in sodium chloride 0.9 % 250 mL chemo infusion, 3.6 mg/kg = 320 mg, Intravenous, Once, 4 of 5 cycles Administration: 320 mg (08/12/2017), 320 mg (09/01/2017), 320 mg (09/23/2017)  for chemotherapy treatment.    11/18/2017 - 08/30/2020 Chemotherapy   The patient had trastuzumab (HERCEPTIN) 546 mg in sodium chloride 0.9 % 250 mL chemo infusion, 6 mg/kg = 546 mg (100 % of original dose 6 mg/kg), Intravenous,  Once, 2 of 2 cycles Dose modification: 6 mg/kg (original dose 6 mg/kg, Cycle 1, Reason: Provider Judgment) Administration: 546 mg (11/18/2017), 546 mg (12/16/2017) pertuzumab (PERJETA) 420 mg in sodium chloride 0.9 % 250 mL chemo infusion, 420 mg (100 % of original dose 420 mg), Intravenous, Once, 15 of 15 cycles Dose  modification: 420 mg (original dose 420 mg, Cycle 18), 420 mg (original dose 420 mg, Cycle 27) Administration: 420  mg (10/06/2018), 420 mg (10/27/2018), 420 mg (11/18/2018), 420 mg (12/08/2018), 420 mg (12/29/2018), 420 mg (01/19/2019), 420 mg (02/16/2019), 420 mg (03/16/2019), 420 mg (04/13/2019), 420 mg (06/08/2019), 420 mg (07/06/2019), 420 mg (08/03/2019), 420 mg (08/31/2019), 420 mg (09/28/2019), 420 mg (05/19/2019) trastuzumab-anns (KANJINTI) 546 mg in sodium chloride 0.9 % 250 mL chemo infusion, 6 mg/kg = 546 mg (100 % of original dose 6 mg/kg), Intravenous,  Once, 30 of 30 cycles Dose modification: 6 mg/kg (original dose 6 mg/kg, Cycle 3, Reason: Other (see comments), Comment: requested step therapy by [facility] insurance), 4 mg/kg (original dose 6 mg/kg, Cycle 9, Reason: Provider Judgment), 6 mg/kg (original dose 6 mg/kg, Cycle 17, Reason: Provider Judgment), 4 mg/kg (original dose 6 mg/kg, Cycle 27, Reason: Provider Judgment), 6 mg/kg (original dose 6 mg/kg, Cycle 27, Reason: Provider Judgment) Administration: 546 mg (01/13/2018), 546 mg (02/10/2018), 546 mg (03/03/2018), 546 mg (03/24/2018), 546 mg (04/14/2018), 546 mg (05/05/2018), 357 mg (05/26/2018), 357 mg (06/09/2018), 357 mg (06/23/2018), 357 mg (07/07/2018), 357 mg (07/21/2018), 357 mg (08/04/2018), 357 mg (08/18/2018), 357 mg (09/01/2018), 546 mg (09/15/2018), 546 mg (10/06/2018), 546 mg (10/27/2018), 546 mg (11/18/2018), 546 mg (12/08/2018), 546 mg (12/29/2018), 546 mg (01/19/2019), 546 mg (02/16/2019), 480 mg (03/16/2019), 483 mg (04/13/2019), 450 mg (06/08/2019), 450 mg (07/06/2019), 450 mg (08/03/2019), 450 mg (08/31/2019), 450 mg (09/28/2019)  for chemotherapy treatment.    12/31/2017 - 09/01/2018 Chemotherapy   The patient had PACLitaxel-protein bound (ABRAXANE) chemo infusion 175 mg, 80 mg/m2 = 175 mg (100 % of original dose 80 mg/m2), Intravenous,  Once, 5 of 10 cycles Dose modification: 80 mg/m2 (original dose 80 mg/m2, Cycle 11) Administration: 175 mg (07/07/2018), 175 mg (07/21/2018), 175 mg (08/04/2018), 175 mg (08/18/2018), 175 mg (09/01/2018) PACLitaxel-protein bound (ABRAXANE) chemo  infusion 200 mg, 100 mg/m2 = 200 mg, Intravenous, Once, 10 of 10 cycles Dose modification: 80 mg/m2 (original dose 100 mg/m2, Cycle 2, Reason: Provider Judgment), 80 mg/m2 (original dose 100 mg/m2, Cycle 2, Reason: Provider Judgment) Administration: 200 mg (12/31/2017), 200 mg (01/06/2018), 175 mg (01/20/2018), 175 mg (01/27/2018), 175 mg (02/10/2018), 175 mg (03/03/2018), 175 mg (03/10/2018), 175 mg (03/24/2018), 175 mg (04/07/2018), 175 mg (04/14/2018), 175 mg (04/28/2018), 175 mg (05/05/2018), 175 mg (05/26/2018), 175 mg (06/09/2018), 175 mg (05/19/2018)  for chemotherapy treatment.    10/05/2020 - 01/18/2021 Chemotherapy   Patient is on Treatment Plan : BREAST METASTATIC fam-trastuzumab deruxtecan-nxki (Enhertu) q21d     02/19/2021 - 05/14/2021 Chemotherapy   Patient is on Treatment Plan : BREAST Trastuzumab q21d x 13 cycles     06/20/2021 - 08/01/2021 Chemotherapy   Patient is on Treatment Plan : BREAST METASTATIC fam-trastuzumab deruxtecan-nxki (Enhertu) q21d     07/25/2021 - 10/19/2021 Chemotherapy   Patient is on Treatment Plan : BREAST METASTATIC Sacituzumab govitecan-hziy Ivette Loyal) D1,8 q21d     08/31/2021 - 09/21/2021 Chemotherapy   Patient is on Treatment Plan : BREAST METASTATIC Sacituzumab govitecan-hziy Ivette Loyal) q21d     Metastasis to liver ([place])  08/04/2017 Initial Diagnosis   Metastasis to liver ([place])   08/12/2017 - 10/14/2017 Chemotherapy   The patient had ado-trastuzumab emtansine (KADCYLA) 320 mg in sodium chloride 0.9 % 250 mL chemo infusion, 3.6 mg/kg = 320 mg, Intravenous, Once, 4 of 5 cycles Administration: 320 mg (08/12/2017), 320 mg (09/01/2017), 320 mg (09/23/2017)  for chemotherapy treatment.    11/18/2017 - 08/30/2020 Chemotherapy   The patient had  trastuzumab (HERCEPTIN) 546 mg in sodium chloride 0.9 % 250 mL chemo infusion, 6 mg/kg = 546 mg (100 % of original dose 6 mg/kg), Intravenous,  Once, 2 of 2 cycles Dose modification: 6 mg/kg (original dose 6 mg/kg, Cycle 1, Reason: Provider  Judgment) Administration: 546 mg (11/18/2017), 546 mg (12/16/2017) pertuzumab (PERJETA) 420 mg in sodium chloride 0.9 % 250 mL chemo infusion, 420 mg (100 % of original dose 420 mg), Intravenous, Once, 15 of 15 cycles Dose modification: 420 mg (original dose 420 mg, Cycle 18), 420 mg (original dose 420 mg, Cycle 27) Administration: 420 mg (10/06/2018), 420 mg (10/27/2018), 420 mg (11/18/2018), 420 mg (12/08/2018), 420 mg (12/29/2018), 420 mg (01/19/2019), 420 mg (02/16/2019), 420 mg (03/16/2019), 420 mg (04/13/2019), 420 mg (06/08/2019), 420 mg (07/06/2019), 420 mg (08/03/2019), 420 mg (08/31/2019), 420 mg (09/28/2019), 420 mg (05/19/2019) trastuzumab-anns (KANJINTI) 546 mg in sodium chloride 0.9 % 250 mL chemo infusion, 6 mg/kg = 546 mg (100 % of original dose 6 mg/kg), Intravenous,  Once, 30 of 30 cycles Dose modification: 6 mg/kg (original dose 6 mg/kg, Cycle 3, Reason: Other (see comments), Comment: requested step therapy by [facility] insurance), 4 mg/kg (original dose 6 mg/kg, Cycle 9, Reason: Provider Judgment), 6 mg/kg (original dose 6 mg/kg, Cycle 17, Reason: Provider Judgment), 4 mg/kg (original dose 6 mg/kg, Cycle 27, Reason: Provider Judgment), 6 mg/kg (original dose 6 mg/kg, Cycle 27, Reason: Provider Judgment) Administration: 546 mg (01/13/2018), 546 mg (02/10/2018), 546 mg (03/03/2018), 546 mg (03/24/2018), 546 mg (04/14/2018), 546 mg (05/05/2018), 357 mg (05/26/2018), 357 mg (06/09/2018), 357 mg (06/23/2018), 357 mg (07/07/2018), 357 mg (07/21/2018), 357 mg (08/04/2018), 357 mg (08/18/2018), 357 mg (09/01/2018), 546 mg (09/15/2018), 546 mg (10/06/2018), 546 mg (10/27/2018), 546 mg (11/18/2018), 546 mg (12/08/2018), 546 mg (12/29/2018), 546 mg (01/19/2019), 546 mg (02/16/2019), 480 mg (03/16/2019), 483 mg (04/13/2019), 450 mg (06/08/2019), 450 mg (07/06/2019), 450 mg (08/03/2019), 450 mg (08/31/2019), 450 mg (09/28/2019)  for chemotherapy treatment.      CHIEF COMPLIANT: Follow-up to review scans  INTERVAL HISTORY: Darlene [name] is a 71 Year-old- with the above mentioned right breast cancer. Currently on Xeloda. She presents to the clinic for a follow-up to revie scans.   ALLERGIES:  is allergic to antihistamines, diphenhydramine-type; nsaids; and tape.  MEDICATIONS:  Current Outpatient Medications  Medication Sig Dispense Refill   Accu-Chek Softclix Lancets lancets 1 each by Other route 4 (four) times daily. Test blood sugars 4 x times daily 200 each 1   aspirin EC 81 MG tablet Take 1 tablet (81 mg total) by mouth daily. 90 tablet 3   Bempedoic Acid-Ezetimibe (NEXLIZET) 180-10 MG TABS Take 1 tablet by mouth daily. 90 tablet 3   Blood Glucose Calibration (ACCU-CHEK GUIDE CONTROL) LIQD 1 Bottle by In Vitro route as needed. 1 each 0   Blood Glucose Monitoring Suppl (ACCU-CHEK GUIDE) w/Device KIT 1 each by Does not apply route 4 (four) times daily. Use to test blood sugars 4x a day 1 kit 0   buPROPion (WELLBUTRIN SR) 200 MG 12 hr tablet Take one tablet by mouth once daily. 90 tablet 0   CALCIUM CITRATE-VITAMIN D PO Take 1 tablet by mouth 3 (three) times daily.      capecitabine (XELODA) 500 MG tablet Take 2 tablets (1,000 mg total) by mouth 2 (two) times daily after a meal. Take for 14 days on, 7 days off. Repeat every 21 days. 60 tablet 3   carvedilol (COREG) 6.25 MG tablet Take 1 tablet (6.25 mg  total) by mouth 2 (two) times daily. 180 tablet 3   cholecalciferol (VITAMIN D) 1000 units tablet Take 1,000 Units by mouth daily.     Cyanocobalamin (VITAMIN B-12 PO) Take 1 tablet by mouth every 14 (fourteen) days.     glucose blood (ACCU-CHEK GUIDE) test strip Use Accu Chek test strips to check blood sugar 4 times daily. DX:E11.21 400 each 0   insulin aspart (NOVOLOG) 100 UNIT/ML injection Inject into the skin daily as needed for high blood sugar. Per patient, sliding scale with meals as needed     insulin glargine (LANTUS SOLOSTAR) 100 UNIT/ML Solostar Pen Inject 8 Units into the skin daily. 7 mL 3   KERENDIA 20 MG  TABS Take 1 tablet by mouth daily. 30 tablet 6   Lactobacillus ([employer] ACIDOPHILUS PROBIOTIC) 0.5 MG TABS Take 1 tablet by mouth daily.     loperamide (IMODIUM) 2 MG capsule Take 2 mg by mouth as needed for diarrhea or loose stools.     melatonin 5 MG TABS Take 5 mg by mouth at bedtime.     ondansetron (ZOFRAN) 8 MG tablet Take 1 tablet (8 mg total) by mouth every 8 (eight) hours as needed for nausea or vomiting. 30 tablet 2   Pediatric Multivitamins-Iron (FLINTSTONES PLUS IRON) chewable tablet Chew 1 tablet by mouth 2 (two) times daily.     polyethylene glycol (MIRALAX / GLYCOLAX) 17 g packet Take 17 g by mouth daily.     prochlorperazine (COMPAZINE) 10 MG tablet Take 1 tablet (10 mg total) by mouth every 6 (six) hours as needed (Nausea or vomiting). 30 tablet 6   Propylene Glycol (SYSTANE BALANCE OP) Place 1-2 drops into both eyes 3 (three) times daily as needed (for dry eyes).      SURE COMFORT PEN NEEDLES 32G X 4 MM MISC Use as instructed to inject insulin and Victoza daily. 200 each 0   tobramycin (TOBREX) 0.3 % ophthalmic ointment Place 1 application into both eyes as needed (Takes day before eye injections).     vortioxetine HBr (TRINTELLIX) 20 MG TABS tablet Take 1 tablet (20 mg total) by mouth at bedtime. 90 tablet 0   No current facility-administered medications for this visit.   Facility-Administered Medications Ordered in Other Visits  Medication Dose Route Frequency Provider Last Rate Last Admin   acetaminophen (TYLENOL) 325 MG tablet            diphenhydrAMINE (BENADRYL) 50 MG/ML injection            famotidine (PEPCID) 20-0.9 MG/50ML-% IVPB            palonosetron (ALOXI) 0.25 MG/5ML injection             PHYSICAL EXAMINATION: ECOG PERFORMANCE STATUS: {CHL ONC ECOG [phone]}  There were no vitals filed for this visit. There were no vitals filed for this visit.  BREAST:*** No palpable masses or nodules in either right or left breasts. No palpable axillary supraclavicular  or infraclavicular adenopathy no breast tenderness or nipple discharge. (exam performed in the presence of a chaperone)  LABORATORY DATA:  I have reviewed the data as listed    Latest Ref Rng & Units 01/17/2022    9:30 AM 12/25/2021   11:06 AM 12/24/2021    3:07 PM  CMP  Glucose 70 - 99 mg/dL  136  149   BUN 8 - 23 mg/dL  35  34   Creatinine 0.44 - 1.00 mg/dL 1.80  1.70  1.97   Sodium  135 - 145 mmol/L  141  140   Potassium 3.5 - 5.1 mmol/L  4.6  4.8   Chloride 98 - 111 mmol/L  108  109   CO2 22 - 32 mmol/L  26  25   Calcium 8.9 - 10.3 mg/dL  9.2  8.8   Total Protein 6.5 - 8.1 g/dL  6.0    Total Bilirubin 0.3 - 1.2 mg/dL  0.7    Alkaline Phos 38 - 126 U/L  67    AST 15 - 41 U/L  31    ALT 0 - 44 U/L  11      Lab Results  Component Value Date   WBC 4.3 12/25/2021   HGB 9.3 (L) 12/25/2021   HCT 27.4 (L) 12/25/2021   MCV 106.6 (H) 12/25/2021   PLT 191 12/25/2021   NEUTROABS 2.8 12/25/2021    ASSESSMENT & PLAN:  No problem-specific Assessment & Plan notes found for this encounter.    No orders of the defined types were placed in this encounter.  The patient has a good understanding of the overall plan. she agrees with it. she will call with any problems that may develop before the next visit here. Total time spent: 30 mins including face to face time and time spent for planning, charting and co-ordination of care   Suzzette Righter, [place] 01/19/22    I Gardiner Coins am acting as a Education administrator for [employer]  ***

## 2022-01-21 ENCOUNTER — Other Ambulatory Visit (HOSPITAL_COMMUNITY): Payer: Self-pay | Admitting: Cardiology

## 2022-01-22 ENCOUNTER — Other Ambulatory Visit: Payer: Medicare PPO

## 2022-01-23 ENCOUNTER — Inpatient Hospital Stay (HOSPITAL_BASED_OUTPATIENT_CLINIC_OR_DEPARTMENT_OTHER): Payer: Medicare PPO | Admitting: Hematology and Oncology

## 2022-01-23 ENCOUNTER — Inpatient Hospital Stay: Payer: Medicare PPO | Attending: Oncology

## 2022-01-23 ENCOUNTER — Encounter (HOSPITAL_COMMUNITY): Payer: Self-pay | Admitting: Cardiology

## 2022-01-23 VITALS — BP 120/56 | HR 59 | Temp 97.9°F | Resp 18 | Ht 69.0 in | Wt 180.0 lb

## 2022-01-23 DIAGNOSIS — Z5189 Encounter for other specified aftercare: Secondary | ICD-10-CM | POA: Diagnosis not present

## 2022-01-23 DIAGNOSIS — Z452 Encounter for adjustment and management of vascular access device: Secondary | ICD-10-CM | POA: Insufficient documentation

## 2022-01-23 DIAGNOSIS — Z5111 Encounter for antineoplastic chemotherapy: Secondary | ICD-10-CM | POA: Diagnosis not present

## 2022-01-23 DIAGNOSIS — Z171 Estrogen receptor negative status [ER-]: Secondary | ICD-10-CM

## 2022-01-23 DIAGNOSIS — C787 Secondary malignant neoplasm of liver and intrahepatic bile duct: Secondary | ICD-10-CM

## 2022-01-23 DIAGNOSIS — C50411 Malignant neoplasm of upper-outer quadrant of right female breast: Secondary | ICD-10-CM

## 2022-01-23 DIAGNOSIS — Z95828 Presence of other vascular implants and grafts: Secondary | ICD-10-CM

## 2022-01-23 LAB — CMP (CANCER CENTER ONLY)
ALT: 11 U/L (ref 0–44)
AST: 46 U/L — ABNORMAL HIGH (ref 15–41)
Albumin: 3.8 g/dL (ref 3.5–5.0)
Alkaline Phosphatase: 87 U/L (ref 38–126)
Anion gap: 5 (ref 5–15)
BUN: 44 mg/dL — ABNORMAL HIGH (ref 8–23)
CO2: 28 mmol/L (ref 22–32)
Calcium: 9.7 mg/dL (ref 8.9–10.3)
Chloride: 103 mmol/L (ref 98–111)
Creatinine: 1.73 mg/dL — ABNORMAL HIGH (ref 0.44–1.00)
GFR, Estimated: 32 mL/min — ABNORMAL LOW (ref >60)
Glucose, Bld: 191 mg/dL — ABNORMAL HIGH (ref 70–99)
Potassium: 4.8 mmol/L (ref 3.5–5.1)
Sodium: 136 mmol/L (ref 135–145)
Total Bilirubin: 0.8 mg/dL (ref 0.3–1.2)
Total Protein: 6.1 g/dL — ABNORMAL LOW (ref 6.5–8.1)

## 2022-01-23 LAB — CBC WITH DIFFERENTIAL (CANCER CENTER ONLY)
Abs Immature Granulocytes: 0.03 10*3/uL (ref 0.00–0.07)
Basophils Absolute: 0 10*3/uL (ref 0.0–0.1)
Basophils Relative: 0 %
Eosinophils Absolute: 0.1 10*3/uL (ref 0.0–0.5)
Eosinophils Relative: 1 %
HCT: 27.3 % — ABNORMAL LOW (ref 36.0–46.0)
Hemoglobin: 9.6 g/dL — ABNORMAL LOW (ref 12.0–15.0)
Immature Granulocytes: 1 %
Lymphocytes Relative: 11 %
Lymphs Abs: 0.6 10*3/uL — ABNORMAL LOW (ref 0.7–4.0)
MCH: 38.1 pg — ABNORMAL HIGH (ref 26.0–34.0)
MCHC: 35.2 g/dL (ref 30.0–36.0)
MCV: 108.3 fL — ABNORMAL HIGH (ref 80.0–100.0)
Monocytes Absolute: 0.5 10*3/uL (ref 0.1–1.0)
Monocytes Relative: 9 %
Neutro Abs: 4.6 10*3/uL (ref 1.7–7.7)
Neutrophils Relative %: 78 %
Platelet Count: 184 10*3/uL (ref 150–400)
RBC: 2.52 MIL/uL — ABNORMAL LOW (ref 3.87–5.11)
RDW: 17.7 % — ABNORMAL HIGH (ref 11.5–15.5)
WBC Count: 5.9 10*3/uL (ref 4.0–10.5)
nRBC: 0 % (ref 0.0–0.2)

## 2022-01-23 LAB — CEA (IN HOUSE-CHCC): CEA (CHCC-In House): 3075.39 ng/mL — ABNORMAL HIGH (ref 0.00–5.00)

## 2022-01-23 MED ORDER — SODIUM CHLORIDE 0.9% FLUSH
10.0000 mL | Freq: Once | INTRAVENOUS | Status: AC
  Administered 2022-01-23: 10 mL

## 2022-01-23 MED ORDER — HEPARIN SOD (PORK) LOCK FLUSH 100 UNIT/ML IV SOLN
500.0000 [IU] | Freq: Once | INTRAVENOUS | Status: AC
  Administered 2022-01-23: 500 [IU]

## 2022-01-23 NOTE — Progress Notes (Signed)
DISCONTINUE ON PATHWAY REGIMEN - Breast     A cycle is every 21 days:     Sacituzumab govitecan-hziy   **Always confirm dose/schedule in your pharmacy ordering system**  REASON: Disease Progression PRIOR TREATMENT: [id number]: Sacituzumab Govitecan 10 mg/kg D1, 8 q21 Days TREATMENT RESPONSE: Progressive Disease (PD)  START ON PATHWAY REGIMEN - Breast     A cycle is every 21 days:     Eribulin mesylate   **Always confirm dose/schedule in your pharmacy ordering system**  Patient Characteristics: Distant Metastases or Locoregional Recurrent Disease - Unresected or Locally Advanced Unresectable Disease Progressing after Neoadjuvant and Local Therapies, HER2 Low/Negative, ER Negative, Chemotherapy, HER2 Negative, Third Line and Beyond, gBRCA  Wildtype or Not a Candidate for Molecular Targeted Therapy Therapeutic Status: Distant Metastases HER2 Status: Negative (-) ER Status: Negative (-) PR Status: Negative (-) Therapy Approach Indicated: Standard Chemotherapy/Endocrine Therapy Line of Therapy: Third Line and Beyond Intent of Therapy: Non-Curative / Palliative Intent, Discussed with Patient 

## 2022-01-23 NOTE — Assessment & Plan Note (Signed)
07/19/2014: Right breast cancer grade 2 IDC ER/PR negative HER2 positive Ki-67 90% treated with neoadjuvant chemo with TCHP x6 cycles followed by Herceptin maintenance, right lumpectomy had residual disease 4 cm 1/3 lymph node positive, ER/PR negative, adjuvant radiation and capecitabine completed 09/27/2015 12/06/2015: Recurrence: Right subpectoral lymph node and subcutaneous nodule, liver mass (biopsy adenocarcinoma consistent with upper GI or pancreaticobiliary primary, HER2 negative, ER negative) July 2019: Metastatic disease: TDM 1 followed by Herceptin maintenance, pertuzumab added 10/06/2018, Abraxane 12/31/2017-09/01/2018, Herceptin and pertuzumab maintenance   Prior treatment: Enhertu started 10/05/2020 switched to Herceptin maintenance 02/19/2021 switched to Enhertu at higher dosage x 3 cycles discontinued 08/01/2021    02/04/2021: PET CT scan: Complete metabolic response, no evidence of hypermetabolic disease in the liver (mass measured 2.7 cm previously is now 1.5 cm with no uptake)    11/02/2021: CT CAP: Increase in the hepatic metastatic lesions (7.5 cm to 8.3 cm) similar size of aortocaval, periportal, gastrohepatic lymph nodes ------------------------------------------------------------------------------------------------------------------------------------------- Current treatment: Xeloda 14 days 7 days off started 11/12/2021   Xeloda toxicities: (Patient could not tolerate 1500 twice daily dosing) she is able to tolerate the 1000 twice daily dosing very well Intermittent diarrhea Nausea and vomiting  CT CAP 01/18/2022: Progressive metastatic disease in the liver (10.7 cm x 13 cm) and lymph nodes in the upper abdomen and retroperitoneum (2.9 cm and 2.3 cm and 1.7 cm)  Recommendation: I recommend switching her treatment from Xeloda to [place].  We also discussed the role of eribulin for triple negative breast cancer. Return to clinic in 1 week to start chemotherapy.

## 2022-01-24 ENCOUNTER — Other Ambulatory Visit (HOSPITAL_COMMUNITY): Payer: Self-pay

## 2022-01-24 ENCOUNTER — Other Ambulatory Visit: Payer: Self-pay

## 2022-01-24 ENCOUNTER — Telehealth: Payer: Self-pay | Admitting: Hematology and Oncology

## 2022-01-24 NOTE — Telephone Encounter (Signed)
Scheduled appointment per WQ. Patient is aware. 

## 2022-01-27 ENCOUNTER — Encounter: Payer: Self-pay | Admitting: Hematology and Oncology

## 2022-01-30 ENCOUNTER — Inpatient Hospital Stay: Payer: Medicare PPO

## 2022-01-30 VITALS — BP 137/57 | HR 61 | Temp 98.2°F | Resp 18 | Wt 179.5 lb

## 2022-01-30 DIAGNOSIS — C50411 Malignant neoplasm of upper-outer quadrant of right female breast: Secondary | ICD-10-CM

## 2022-01-30 DIAGNOSIS — Z171 Estrogen receptor negative status [ER-]: Secondary | ICD-10-CM | POA: Diagnosis not present

## 2022-01-30 DIAGNOSIS — Z452 Encounter for adjustment and management of vascular access device: Secondary | ICD-10-CM | POA: Diagnosis not present

## 2022-01-30 DIAGNOSIS — Z5189 Encounter for other specified aftercare: Secondary | ICD-10-CM | POA: Diagnosis not present

## 2022-01-30 DIAGNOSIS — Z95828 Presence of other vascular implants and grafts: Secondary | ICD-10-CM

## 2022-01-30 DIAGNOSIS — C787 Secondary malignant neoplasm of liver and intrahepatic bile duct: Secondary | ICD-10-CM

## 2022-01-30 DIAGNOSIS — Z5111 Encounter for antineoplastic chemotherapy: Secondary | ICD-10-CM | POA: Diagnosis not present

## 2022-01-30 LAB — CBC WITH DIFFERENTIAL (CANCER CENTER ONLY)
Abs Immature Granulocytes: 0.05 10*3/uL (ref 0.00–0.07)
Basophils Absolute: 0 10*3/uL (ref 0.0–0.1)
Basophils Relative: 0 %
Eosinophils Absolute: 0.1 10*3/uL (ref 0.0–0.5)
Eosinophils Relative: 1 %
HCT: 27.3 % — ABNORMAL LOW (ref 36.0–46.0)
Hemoglobin: 9.4 g/dL — ABNORMAL LOW (ref 12.0–15.0)
Immature Granulocytes: 1 %
Lymphocytes Relative: 7 %
Lymphs Abs: 0.5 10*3/uL — ABNORMAL LOW (ref 0.7–4.0)
MCH: 38.1 pg — ABNORMAL HIGH (ref 26.0–34.0)
MCHC: 34.4 g/dL (ref 30.0–36.0)
MCV: 110.5 fL — ABNORMAL HIGH (ref 80.0–100.0)
Monocytes Absolute: 0.6 10*3/uL (ref 0.1–1.0)
Monocytes Relative: 9 %
Neutro Abs: 5.9 10*3/uL (ref 1.7–7.7)
Neutrophils Relative %: 82 %
Platelet Count: 172 10*3/uL (ref 150–400)
RBC: 2.47 MIL/uL — ABNORMAL LOW (ref 3.87–5.11)
RDW: 17.8 % — ABNORMAL HIGH (ref 11.5–15.5)
WBC Count: 7.2 10*3/uL (ref 4.0–10.5)
nRBC: 0 % (ref 0.0–0.2)

## 2022-01-30 LAB — CMP (CANCER CENTER ONLY)
ALT: 11 U/L (ref 0–44)
AST: 52 U/L — ABNORMAL HIGH (ref 15–41)
Albumin: 3.8 g/dL (ref 3.5–5.0)
Alkaline Phosphatase: 87 U/L (ref 38–126)
Anion gap: 5 (ref 5–15)
BUN: 45 mg/dL — ABNORMAL HIGH (ref 8–23)
CO2: 28 mmol/L (ref 22–32)
Calcium: 9.8 mg/dL (ref 8.9–10.3)
Chloride: 104 mmol/L (ref 98–111)
Creatinine: 2.11 mg/dL — ABNORMAL HIGH (ref 0.44–1.00)
GFR, Estimated: 25 mL/min — ABNORMAL LOW (ref >60)
Glucose, Bld: 153 mg/dL — ABNORMAL HIGH (ref 70–99)
Potassium: 4.9 mmol/L (ref 3.5–5.1)
Sodium: 137 mmol/L (ref 135–145)
Total Bilirubin: 0.7 mg/dL (ref 0.3–1.2)
Total Protein: 6.4 g/dL — ABNORMAL LOW (ref 6.5–8.1)

## 2022-01-30 LAB — CEA (IN HOUSE-CHCC): CEA (CHCC-In House): 2934.78 ng/mL — ABNORMAL HIGH (ref 0.00–5.00)

## 2022-01-30 LAB — MAGNESIUM: Magnesium: 2.1 mg/dL (ref 1.7–2.4)

## 2022-01-30 MED ORDER — SODIUM CHLORIDE 0.9% FLUSH
10.0000 mL | INTRAVENOUS | Status: DC | PRN
  Administered 2022-01-30: 10 mL

## 2022-01-30 MED ORDER — ONDANSETRON HCL 4 MG/2ML IJ SOLN
8.0000 mg | Freq: Once | INTRAMUSCULAR | Status: AC
  Administered 2022-01-30: 8 mg via INTRAVENOUS
  Filled 2022-01-30: qty 4

## 2022-01-30 MED ORDER — SODIUM CHLORIDE 0.9% FLUSH
10.0000 mL | Freq: Once | INTRAVENOUS | Status: AC
  Administered 2022-01-30: 10 mL

## 2022-01-30 MED ORDER — SODIUM CHLORIDE 0.9 % IV SOLN
1.1000 mg/m2 | Freq: Once | INTRAVENOUS | Status: AC
  Administered 2022-01-30: 2.2 mg via INTRAVENOUS
  Filled 2022-01-30: qty 4.4

## 2022-01-30 MED ORDER — SODIUM CHLORIDE 0.9 % IV SOLN: Freq: Once | INTRAVENOUS | Status: AC

## 2022-01-30 MED ORDER — SODIUM CHLORIDE 0.9 % IV SOLN: 8.0000 mg | Freq: Once | INTRAVENOUS | Status: DC

## 2022-01-30 MED ORDER — HEPARIN SOD (PORK) LOCK FLUSH 100 UNIT/ML IV SOLN
500.0000 [IU] | Freq: Once | INTRAVENOUS | Status: AC | PRN
  Administered 2022-01-30: 500 [IU]

## 2022-01-30 NOTE — Patient Instructions (Signed)
Eribulin Injection What is this medication? ERIBULIN (er e bu lin) treats breast cancer. It may also be used to treat sarcoma, a cancer that occurs in bone and connective tissues, such as fat, muscle, and blood vessels. It works by slowing down the growth of cancer cells. This medicine may be used for other purposes; ask your health care provider or pharmacist if you have questions. COMMON BRAND NAME(S): Halaven What should I tell my care team before I take this medication? They need to know if you have any of these conditions: Heart failure Irregular heartbeat or rhythm Kidney disease Liver disease Low levels of potassium or magnesium in the blood Tingling of the fingers or toes or other nerve disorder An unusual or allergic reaction to eribulin, other medications, foods, dyes, or preservatives Pregnant or trying to get pregnant Breast-feeding How should I use this medication? This medication is injected into a vein. It is given by your care team in a hospital or clinic setting. Talk to your care team regarding the use of this medication in children. Special care may be needed. Overdosage: If you think you have taken too much of this medicine contact a poison control center or emergency room at once. NOTE: This medicine is only for you. Do not share this medicine with others. What if I miss a dose? Keep appointments for follow-up doses. It is important not to miss your dose. Call your care team if you are unable to keep an appointment. What may interact with this medication? Do not take this medication with any of the following: Cisapride Dronedarone Ketoconazole Levoketoconazole Pimozide Thioridazine This medication may also interact with the following: Other medications that cause heart rhythm changes This list may not describe all possible interactions. Give your health care provider a list of all the medicines, herbs, non-prescription drugs, or dietary supplements you use. Also  tell them if you smoke, drink alcohol, or use illegal drugs. Some items may interact with your medicine. What should I watch for while using this medication? This medication may make you feel generally unwell. This is not uncommon, as chemotherapy can affect healthy cells as well as cancer cells. Report any side effects. Continue your course of treatment even though you feel ill unless your care team tells you to stop. This medication may increase your risk of getting an infection. Call your care team for advice if you get a fever, chills, sore throat, or other symptoms of a cold or flu. Do not treat yourself. Try to avoid being around people who are sick. Avoid taking medications that contain aspirin, acetaminophen, ibuprofen, naproxen, or ketoprofen unless instructed by your care team. These medications may hide a fever. Be careful brushing or flossing your teeth or using a toothpick because you may get an infection or bleed more easily. If you have any dental work done, tell your dentist you are receiving this medication. You may need blood work while you are taking this medicine. Talk to your care team if you wish to become pregnant or think you might be pregnant. This medication can cause serious birth defects. A reliable form of contraception is recommended while taking this medication and for 2 weeks after stopping therapy. Talk to your care team about reliable forms of contraception. Do not father a child while taking this medication and for 3 and 1/2 months (14 weeks) after stopping therapy. Tell your care team right away if you think you or your partner might be pregnant. Do not breast-feed while taking  this medication and for 2 weeks after stopping therapy. This medication may cause infertility. Talk to your care team if you are concerned about your fertility. What side effects may I notice from receiving this medication? Side effects that you should report to your care team as soon as  possible: Allergic reactions--skin rash, itching, hives, swelling of the face, lips, tongue, or throat Heart rhythm changes--fast or irregular heartbeat, dizziness, feeling faint or lightheaded, chest pain, trouble breathing Infection--fever, chills, cough, sore throat, wounds that don't heal, pain or trouble when passing urine, general feeling of discomfort or being unwell Pain, tingling, or numbness in the hands or feet Unusual bruising or bleeding Side effects that usually do not require medical attention (report to your care team if they continue or are bothersome): Constipation Fatigue Hair loss Headache Nausea Stomach pain This list may not describe all possible side effects. Call your doctor for medical advice about side effects. You may report side effects to FDA at 1-800-FDA-1088. Where should I keep my medication? This medication is given in a hospital or clinic and will not be stored at home. NOTE: This sheet is a summary. It may not cover all possible information. If you have questions about this medicine, talk to your doctor, pharmacist, or health care provider.  2023 Elsevier/Gold Standard (2021-04-13 00:00:00)

## 2022-01-30 NOTE — Progress Notes (Signed)
Decrease erilbulin to 1.1 mg/m2 for CrCl = 31m/min. OK per Dr GLindi Adie

## 2022-01-30 NOTE — Progress Notes (Signed)
Per Dr. Lindi Adie, okay for patient to proceed with treatment today with creatine 2.11.

## 2022-01-31 ENCOUNTER — Telehealth: Payer: Self-pay

## 2022-01-31 NOTE — Telephone Encounter (Signed)
-----   Message from Manuella Ghazi, RN sent at 01/30/2022 11:09 AM EST ----- Regarding: [place]-first time follow up Please follow up with patient. Received Halaven for first time 01/30/22

## 2022-01-31 NOTE — Telephone Encounter (Signed)
LM for patient that this nurse was calling to see how they were doing after their treatment. Please call back to Dr. Gudena's nurse at [phone] if they have any questions or concerns regarding the treatment. 

## 2022-02-05 NOTE — Assessment & Plan Note (Addendum)
07/19/2014: Right breast cancer grade 2 IDC ER/PR negative HER2 positive Ki-67 90% treated with neoadjuvant chemo with TCHP x6 cycles followed by Herceptin maintenance, right lumpectomy had residual disease 4 cm 1/3 lymph node positive, ER/PR negative, adjuvant radiation and capecitabine completed 09/27/2015 12/06/2015: Recurrence: Right subpectoral lymph node and subcutaneous nodule, liver mass (biopsy adenocarcinoma consistent with upper GI or pancreaticobiliary primary, HER2 negative, ER negative) July 2019: Metastatic disease: TDM 1 followed by Herceptin maintenance, pertuzumab added 10/06/2018, Abraxane 12/31/2017-09/01/2018, Herceptin and pertuzumab maintenance   Prior treatment: Enhertu started 10/05/2020 switched to Herceptin maintenance 02/19/2021 switched to Enhertu at higher dosage x 3 cycles discontinued 08/01/2021 , Xeloda  started 11/12/2021 discontinued December 2023 for progression   02/04/2021: PET CT scan: Complete metabolic response, no evidence of hypermetabolic disease in the liver (mass measured 2.7 cm previously is now 1.5 cm with no uptake)    11/02/2021: CT CAP: Increase in the hepatic metastatic lesions (7.5 cm to 8.3 cm) similar size of aortocaval, periportal, gastrohepatic lymph nodes ------------------------------------------------------------------------------------------------------------------------------------------- CT CAP 01/18/2022: Progressive metastatic disease in the liver (10.7 cm x 13 cm) and lymph nodes in the upper abdomen and retroperitoneum (2.9 cm and 2.3 cm and 1.7 cm)   Current treatment: Eribulin started 01/30/2022 Eribulin toxicities:  Leukopenia: We may keep the dosage the same if ANC is greater than 1000 she may need Neulasta after day 8 if necessary. Anemia: Slightly worse since she started chemo.  Fatigue  Apparently there is a drug shortage and today.  Unable to give her treatment.  She will hopefully come back Monday for day 8 of her [place] treatment.   She is tolerating it amazingly well and we hope to continue with this drug and see that it has good response for her.    Return to clinic for [place] treatments.

## 2022-02-06 ENCOUNTER — Ambulatory Visit: Payer: Medicare PPO | Admitting: Hematology and Oncology

## 2022-02-06 ENCOUNTER — Other Ambulatory Visit: Payer: Medicare PPO

## 2022-02-06 ENCOUNTER — Inpatient Hospital Stay: Payer: Medicare PPO

## 2022-02-06 ENCOUNTER — Inpatient Hospital Stay: Payer: Medicare PPO | Admitting: Hematology and Oncology

## 2022-02-06 ENCOUNTER — Other Ambulatory Visit: Payer: Self-pay

## 2022-02-06 VITALS — BP 143/52 | HR 57 | Temp 97.7°F | Resp 15 | Wt 177.8 lb

## 2022-02-06 DIAGNOSIS — Z5111 Encounter for antineoplastic chemotherapy: Secondary | ICD-10-CM | POA: Diagnosis not present

## 2022-02-06 DIAGNOSIS — C50411 Malignant neoplasm of upper-outer quadrant of right female breast: Secondary | ICD-10-CM

## 2022-02-06 DIAGNOSIS — Z95828 Presence of other vascular implants and grafts: Secondary | ICD-10-CM

## 2022-02-06 DIAGNOSIS — C787 Secondary malignant neoplasm of liver and intrahepatic bile duct: Secondary | ICD-10-CM

## 2022-02-06 DIAGNOSIS — Z171 Estrogen receptor negative status [ER-]: Secondary | ICD-10-CM | POA: Diagnosis not present

## 2022-02-06 DIAGNOSIS — E1140 Type 2 diabetes mellitus with diabetic neuropathy, unspecified: Secondary | ICD-10-CM

## 2022-02-06 DIAGNOSIS — Z5189 Encounter for other specified aftercare: Secondary | ICD-10-CM | POA: Diagnosis not present

## 2022-02-06 DIAGNOSIS — Z452 Encounter for adjustment and management of vascular access device: Secondary | ICD-10-CM | POA: Diagnosis not present

## 2022-02-06 LAB — CBC WITH DIFFERENTIAL (CANCER CENTER ONLY)
Abs Immature Granulocytes: 0.1 10*3/uL — ABNORMAL HIGH (ref 0.00–0.07)
Basophils Absolute: 0.1 10*3/uL (ref 0.0–0.1)
Basophils Relative: 4 %
Eosinophils Absolute: 0 10*3/uL (ref 0.0–0.5)
Eosinophils Relative: 0 %
HCT: 25.2 % — ABNORMAL LOW (ref 36.0–46.0)
Hemoglobin: 9 g/dL — ABNORMAL LOW (ref 12.0–15.0)
Immature Granulocytes: 5 %
Lymphocytes Relative: 38 %
Lymphs Abs: 0.7 10*3/uL (ref 0.7–4.0)
MCH: 38.5 pg — ABNORMAL HIGH (ref 26.0–34.0)
MCHC: 35.7 g/dL (ref 30.0–36.0)
MCV: 107.7 fL — ABNORMAL HIGH (ref 80.0–100.0)
Monocytes Absolute: 0.3 10*3/uL (ref 0.1–1.0)
Monocytes Relative: 18 %
Neutro Abs: 0.7 10*3/uL — ABNORMAL LOW (ref 1.7–7.7)
Neutrophils Relative %: 35 %
Platelet Count: 179 10*3/uL (ref 150–400)
RBC: 2.34 MIL/uL — ABNORMAL LOW (ref 3.87–5.11)
RDW: 15.9 % — ABNORMAL HIGH (ref 11.5–15.5)
WBC Count: 1.9 10*3/uL — ABNORMAL LOW (ref 4.0–10.5)
nRBC: 0 % (ref 0.0–0.2)

## 2022-02-06 LAB — CMP (CANCER CENTER ONLY)
ALT: 10 U/L (ref 0–44)
AST: 28 U/L (ref 15–41)
Albumin: 3.5 g/dL (ref 3.5–5.0)
Alkaline Phosphatase: 83 U/L (ref 38–126)
Anion gap: 4 — ABNORMAL LOW (ref 5–15)
BUN: 31 mg/dL — ABNORMAL HIGH (ref 8–23)
CO2: 31 mmol/L (ref 22–32)
Calcium: 9.4 mg/dL (ref 8.9–10.3)
Chloride: 103 mmol/L (ref 98–111)
Creatinine: 1.58 mg/dL — ABNORMAL HIGH (ref 0.44–1.00)
GFR, Estimated: 35 mL/min — ABNORMAL LOW (ref >60)
Glucose, Bld: 229 mg/dL — ABNORMAL HIGH (ref 70–99)
Potassium: 4.6 mmol/L (ref 3.5–5.1)
Sodium: 138 mmol/L (ref 135–145)
Total Bilirubin: 0.5 mg/dL (ref 0.3–1.2)
Total Protein: 6 g/dL — ABNORMAL LOW (ref 6.5–8.1)

## 2022-02-06 LAB — CEA (IN HOUSE-CHCC): CEA (CHCC-In House): 3205.62 ng/mL — ABNORMAL HIGH (ref 0.00–5.00)

## 2022-02-06 LAB — MAGNESIUM: Magnesium: 1.8 mg/dL (ref 1.7–2.4)

## 2022-02-06 MED ORDER — SODIUM CHLORIDE 0.9% FLUSH
10.0000 mL | Freq: Once | INTRAVENOUS | Status: AC
  Administered 2022-02-06: 10 mL

## 2022-02-06 MED ORDER — ACCU-CHEK GUIDE VI STRP: ORAL_STRIP | 3 refills | Status: DC

## 2022-02-06 NOTE — Progress Notes (Deleted)
Patient Care Team: Isaac Bliss, Rayford Halsted, MD as PCP - General (Internal Medicine) Larey Dresser, MD as PCP - Advanced Heart Failure (Cardiology) Stark Klein, MD as Consulting Physician (General Surgery) Jacelyn Pi, MD as Consulting Physician (Endocrinology) [name], MD as Consulting Physician (Plastic Surgery) Gery Pray, MD as Consulting Physician (Radiation Oncology) Juanita Craver, MD as Consulting Physician (Gastroenterology) Alda Berthold, DO as Consulting Physician (Neurology) Starlyn Skeans, MD as Consulting Physician (Family Medicine) Sherlynn Stalls, MD as Consulting Physician (Ophthalmology) Salvadore Dom, MD as Consulting Physician (Obstetrics and Gynecology) Raina Mina, RPH-CPP (Pharmacist) Nicholas Lose, MD as Consulting Physician (Hematology and Oncology)  DIAGNOSIS:  Encounter Diagnoses  Name Primary?   Type 2 diabetes mellitus with diabetic neuropathy, with long-term current use of insulin (HCC)    Malignant neoplasm of upper-outer quadrant of right breast in female, estrogen receptor negative ([place]) Yes    SUMMARY OF ONCOLOGIC HISTORY: Oncology History  Breast cancer of upper-outer quadrant of right female breast ([place])  07/19/2014 Initial Biopsy   (R) breast needle biopsy (10:00): IDC, DCIS, grade 2. ER-, PR-, HER2+ (ratio 2.42). Ki67 90%.    07/22/2014 Initial Diagnosis   Breast cancer of upper-outer quadrant of right female breast   08/08/2014 - 11/21/2014 Neo-Adjuvant Chemotherapy   Taxotere/Carbo/Herceptin/Perjeta x 6 cycles.    12/12/2014 - 10/02/2015 Chemotherapy   Maintenance Herceptin (to complete 1 year of therapy).    12/27/2014 Surgery   (R) lumpectomy with [facility]): IDC, grade 2, spanning 4 cm, associated high grade DCIS. Negative margins. 1/3 SLN (+).   ER/PR repeated and remain negative.   ypT2, ypN1a: Stage IIB   01/06/2015 Surgery   Bilateral breast mammoplasty ([place]): Right breast path with  intralymphatic emboli of ductal carcinoma. Left breast benign.    02/02/2015 Imaging   CT chest: No findings of metastatic disease in the chest. Right axillary fluid density lesion likely postoperative seroma or hematoma. Small left subpectoral nodes warrant followup attention.   02/02/2015 Imaging   Bone scan: No scintigraphic evidence of osseous metastatic disease   02/16/2015 Surgery   (R) mastectomy and [facility]): Scattered foci of high grade DCIS, scattered intralymphatic tumor emboli. Margins negative. 0/15 LNs.    04/05/2015 - 05/19/2015 Radiation Therapy   Adjuvant XRT ([place]). The right chest wall was treated to 50.4 Gy in 28 fractions at 1.8 Gy per fraction.  The right mastectomy scar was treated to 10 Gy in 5 fractions at 2 Gy per fraction.    12/06/2015 PET scan   Right subpectoral lymph node measuring 9 mm and exhibits malignant range FDG uptake, suspicious for metastatic adenopathy. Subcutaneous nodule within the upper left chest wall exhibits malignant range uptake, which is indeterminate; this is at the site of previous Port-A-Cath and is favored to represent postsurgical change. Indeterminate left adrenal nodules which exhibit mild uptake, favored to represent benign adenoma.   08/11/2017 - 11/03/2017 Chemotherapy   The patient had ado-trastuzumab emtansine (KADCYLA) 320 mg in sodium chloride 0.9 % 250 mL chemo infusion, 3.6 mg/kg = 320 mg, Intravenous, Once, 4 of 5 cycles Administration: 320 mg (08/12/2017), 320 mg (09/01/2017), 320 mg (09/23/2017)  for chemotherapy treatment.    11/18/2017 - 08/30/2020 Chemotherapy   The patient had trastuzumab (HERCEPTIN) 546 mg in sodium chloride 0.9 % 250 mL chemo infusion, 6 mg/kg = 546 mg (100 % of original dose 6 mg/kg), Intravenous,  Once, 2 of 2 cycles Dose modification: 6 mg/kg (original dose 6 mg/kg, Cycle 1,  Reason: Provider Judgment) Administration: 546 mg (11/18/2017), 546 mg (12/16/2017) pertuzumab (PERJETA) 420 mg in sodium  chloride 0.9 % 250 mL chemo infusion, 420 mg (100 % of original dose 420 mg), Intravenous, Once, 15 of 15 cycles Dose modification: 420 mg (original dose 420 mg, Cycle 18), 420 mg (original dose 420 mg, Cycle 27) Administration: 420 mg (10/06/2018), 420 mg (10/27/2018), 420 mg (11/18/2018), 420 mg (12/08/2018), 420 mg (12/29/2018), 420 mg (01/19/2019), 420 mg (02/16/2019), 420 mg (03/16/2019), 420 mg (04/13/2019), 420 mg (06/08/2019), 420 mg (07/06/2019), 420 mg (08/03/2019), 420 mg (08/31/2019), 420 mg (09/28/2019), 420 mg (05/19/2019) trastuzumab-anns (KANJINTI) 546 mg in sodium chloride 0.9 % 250 mL chemo infusion, 6 mg/kg = 546 mg (100 % of original dose 6 mg/kg), Intravenous,  Once, 30 of 30 cycles Dose modification: 6 mg/kg (original dose 6 mg/kg, Cycle 3, Reason: Other (see comments), Comment: requested step therapy by [facility] insurance), 4 mg/kg (original dose 6 mg/kg, Cycle 9, Reason: Provider Judgment), 6 mg/kg (original dose 6 mg/kg, Cycle 17, Reason: Provider Judgment), 4 mg/kg (original dose 6 mg/kg, Cycle 27, Reason: Provider Judgment), 6 mg/kg (original dose 6 mg/kg, Cycle 27, Reason: Provider Judgment) Administration: 546 mg (01/13/2018), 546 mg (02/10/2018), 546 mg (03/03/2018), 546 mg (03/24/2018), 546 mg (04/14/2018), 546 mg (05/05/2018), 357 mg (05/26/2018), 357 mg (06/09/2018), 357 mg (06/23/2018), 357 mg (07/07/2018), 357 mg (07/21/2018), 357 mg (08/04/2018), 357 mg (08/18/2018), 357 mg (09/01/2018), 546 mg (09/15/2018), 546 mg (10/06/2018), 546 mg (10/27/2018), 546 mg (11/18/2018), 546 mg (12/08/2018), 546 mg (12/29/2018), 546 mg (01/19/2019), 546 mg (02/16/2019), 480 mg (03/16/2019), 483 mg (04/13/2019), 450 mg (06/08/2019), 450 mg (07/06/2019), 450 mg (08/03/2019), 450 mg (08/31/2019), 450 mg (09/28/2019)  for chemotherapy treatment.    12/31/2017 - 09/01/2018 Chemotherapy   The patient had PACLitaxel-protein bound (ABRAXANE) chemo infusion 175 mg, 80 mg/m2 = 175 mg (100 % of original dose 80 mg/m2), Intravenous,  Once, 5 of 10  cycles Dose modification: 80 mg/m2 (original dose 80 mg/m2, Cycle 11) Administration: 175 mg (07/07/2018), 175 mg (07/21/2018), 175 mg (08/04/2018), 175 mg (08/18/2018), 175 mg (09/01/2018) PACLitaxel-protein bound (ABRAXANE) chemo infusion 200 mg, 100 mg/m2 = 200 mg, Intravenous, Once, 10 of 10 cycles Dose modification: 80 mg/m2 (original dose 100 mg/m2, Cycle 2, Reason: Provider Judgment), 80 mg/m2 (original dose 100 mg/m2, Cycle 2, Reason: Provider Judgment) Administration: 200 mg (12/31/2017), 200 mg (01/06/2018), 175 mg (01/20/2018), 175 mg (01/27/2018), 175 mg (02/10/2018), 175 mg (03/03/2018), 175 mg (03/10/2018), 175 mg (03/24/2018), 175 mg (04/07/2018), 175 mg (04/14/2018), 175 mg (04/28/2018), 175 mg (05/05/2018), 175 mg (05/26/2018), 175 mg (06/09/2018), 175 mg (05/19/2018)  for chemotherapy treatment.    10/05/2020 - 01/18/2021 Chemotherapy   Patient is on Treatment Plan : BREAST METASTATIC fam-trastuzumab deruxtecan-nxki (Enhertu) q21d     02/19/2021 - 05/14/2021 Chemotherapy   Patient is on Treatment Plan : BREAST Trastuzumab q21d x 13 cycles     06/20/2021 - 08/01/2021 Chemotherapy   Patient is on Treatment Plan : BREAST METASTATIC fam-trastuzumab deruxtecan-nxki (Enhertu) q21d     07/25/2021 - 10/19/2021 Chemotherapy   Patient is on Treatment Plan : BREAST METASTATIC Sacituzumab govitecan-hziy Ivette Loyal) D1,8 q21d     08/31/2021 - 09/21/2021 Chemotherapy   Patient is on Treatment Plan : BREAST METASTATIC Sacituzumab govitecan-hziy Ivette Loyal) q21d     01/30/2022 -  Chemotherapy   Patient is on Treatment Plan : BREAST METASTATIC Eribulin D1,8 q21d     Metastasis to liver ([place])  08/04/2017 Initial Diagnosis   Metastasis to liver ([place])  08/12/2017 - 10/14/2017 Chemotherapy   The patient had ado-trastuzumab emtansine (KADCYLA) 320 mg in sodium chloride 0.9 % 250 mL chemo infusion, 3.6 mg/kg = 320 mg, Intravenous, Once, 4 of 5 cycles Administration: 320 mg (08/12/2017), 320 mg (09/01/2017), 320 mg (09/23/2017)   for chemotherapy treatment.    11/18/2017 - 08/30/2020 Chemotherapy   The patient had trastuzumab (HERCEPTIN) 546 mg in sodium chloride 0.9 % 250 mL chemo infusion, 6 mg/kg = 546 mg (100 % of original dose 6 mg/kg), Intravenous,  Once, 2 of 2 cycles Dose modification: 6 mg/kg (original dose 6 mg/kg, Cycle 1, Reason: Provider Judgment) Administration: 546 mg (11/18/2017), 546 mg (12/16/2017) pertuzumab (PERJETA) 420 mg in sodium chloride 0.9 % 250 mL chemo infusion, 420 mg (100 % of original dose 420 mg), Intravenous, Once, 15 of 15 cycles Dose modification: 420 mg (original dose 420 mg, Cycle 18), 420 mg (original dose 420 mg, Cycle 27) Administration: 420 mg (10/06/2018), 420 mg (10/27/2018), 420 mg (11/18/2018), 420 mg (12/08/2018), 420 mg (12/29/2018), 420 mg (01/19/2019), 420 mg (02/16/2019), 420 mg (03/16/2019), 420 mg (04/13/2019), 420 mg (06/08/2019), 420 mg (07/06/2019), 420 mg (08/03/2019), 420 mg (08/31/2019), 420 mg (09/28/2019), 420 mg (05/19/2019) trastuzumab-anns (KANJINTI) 546 mg in sodium chloride 0.9 % 250 mL chemo infusion, 6 mg/kg = 546 mg (100 % of original dose 6 mg/kg), Intravenous,  Once, 30 of 30 cycles Dose modification: 6 mg/kg (original dose 6 mg/kg, Cycle 3, Reason: Other (see comments), Comment: requested step therapy by [facility] insurance), 4 mg/kg (original dose 6 mg/kg, Cycle 9, Reason: Provider Judgment), 6 mg/kg (original dose 6 mg/kg, Cycle 17, Reason: Provider Judgment), 4 mg/kg (original dose 6 mg/kg, Cycle 27, Reason: Provider Judgment), 6 mg/kg (original dose 6 mg/kg, Cycle 27, Reason: Provider Judgment) Administration: 546 mg (01/13/2018), 546 mg (02/10/2018), 546 mg (03/03/2018), 546 mg (03/24/2018), 546 mg (04/14/2018), 546 mg (05/05/2018), 357 mg (05/26/2018), 357 mg (06/09/2018), 357 mg (06/23/2018), 357 mg (07/07/2018), 357 mg (07/21/2018), 357 mg (08/04/2018), 357 mg (08/18/2018), 357 mg (09/01/2018), 546 mg (09/15/2018), 546 mg (10/06/2018), 546 mg (10/27/2018), 546 mg (11/18/2018), 546 mg  (12/08/2018), 546 mg (12/29/2018), 546 mg (01/19/2019), 546 mg (02/16/2019), 480 mg (03/16/2019), 483 mg (04/13/2019), 450 mg (06/08/2019), 450 mg (07/06/2019), 450 mg (08/03/2019), 450 mg (08/31/2019), 450 mg (09/28/2019)  for chemotherapy treatment.    01/30/2022 -  Chemotherapy   Patient is on Treatment Plan : BREAST METASTATIC Eribulin D1,8 q21d       CHIEF COMPLIANT: Follow-up on Eribulin   INTERVAL HISTORY: Darlene [name] is a 72 Year-old- with the above mentioned right breast cancer. Currently on Xeloda. She presents to the clinic for a follow-up and treatment.  She tolerated the eribulin extremely well without any problems or concerns.  She felt slightly more fatigued than usual.  Denies any nausea or vomiting.  She does take Compazine for prevention.  She takes a stool softener and a laxative for the constipation.   ALLERGIES:  is allergic to antihistamines, diphenhydramine-type; nsaids; and tape.  MEDICATIONS:  Current Outpatient Medications  Medication Sig Dispense Refill   Accu-Chek Softclix Lancets lancets 1 each by Other route 4 (four) times daily. Test blood sugars 4 x times daily 200 each 1   aspirin EC 81 MG tablet Take 1 tablet (81 mg total) by mouth daily. 90 tablet 3   Bempedoic Acid-Ezetimibe (NEXLIZET) 180-10 MG TABS Take 1 tablet by mouth daily. 90 tablet 3   Blood Glucose Calibration (ACCU-CHEK GUIDE CONTROL) LIQD 1 Bottle by In Vitro  route as needed. 1 each 0   Blood Glucose Monitoring Suppl (ACCU-CHEK GUIDE) w/Device KIT 1 each by Does not apply route 4 (four) times daily. Use to test blood sugars 4x a day 1 kit 0   buPROPion (WELLBUTRIN SR) 200 MG 12 hr tablet Take one tablet by mouth once daily. 90 tablet 0   CALCIUM CITRATE-VITAMIN D PO Take 1 tablet by mouth 3 (three) times daily.      carvedilol (COREG) 6.25 MG tablet Take 1 tablet (6.25 mg total) by mouth 2 (two) times daily. 180 tablet 3   cholecalciferol (VITAMIN D) 1000 units tablet Take 1,000 Units by mouth  daily.     Cyanocobalamin (VITAMIN B-12 PO) Take 1 tablet by mouth every 14 (fourteen) days.     glucose blood (ACCU-CHEK GUIDE) test strip Use Accu Chek test strips to check blood sugar 4 times daily. DX:E11.21 400 each 3   insulin aspart (NOVOLOG) 100 UNIT/ML injection Inject into the skin daily as needed for high blood sugar. Per patient, sliding scale with meals as needed     insulin glargine (LANTUS SOLOSTAR) 100 UNIT/ML Solostar Pen Inject 8 Units into the skin daily. 7 mL 3   KERENDIA 20 MG TABS TAKE 1 TABLET EVERY DAY 90 tablet 3   Lactobacillus ([employer] ACIDOPHILUS PROBIOTIC) 0.5 MG TABS Take 1 tablet by mouth daily.     loperamide (IMODIUM) 2 MG capsule Take 2 mg by mouth as needed for diarrhea or loose stools.     melatonin 5 MG TABS Take 5 mg by mouth at bedtime.     ondansetron (ZOFRAN) 8 MG tablet Take 1 tablet (8 mg total) by mouth every 8 (eight) hours as needed for nausea or vomiting. 30 tablet 2   Pediatric Multivitamins-Iron (FLINTSTONES PLUS IRON) chewable tablet Chew 1 tablet by mouth 2 (two) times daily.     polyethylene glycol (MIRALAX / GLYCOLAX) 17 g packet Take 17 g by mouth daily.     prochlorperazine (COMPAZINE) 10 MG tablet Take 1 tablet (10 mg total) by mouth every 6 (six) hours as needed (Nausea or vomiting). 30 tablet 6   Propylene Glycol (SYSTANE BALANCE OP) Place 1-2 drops into both eyes 3 (three) times daily as needed (for dry eyes).      SURE COMFORT PEN NEEDLES 32G X 4 MM MISC Use as instructed to inject insulin and Victoza daily. 200 each 0   tobramycin (TOBREX) 0.3 % ophthalmic ointment Place 1 application into both eyes as needed (Takes day before eye injections).     vortioxetine HBr (TRINTELLIX) 20 MG TABS tablet Take 1 tablet (20 mg total) by mouth at bedtime. 90 tablet 0   No current facility-administered medications for this visit.   Facility-Administered Medications Ordered in Other Visits  Medication Dose Route Frequency Provider Last Rate Last Admin    acetaminophen (TYLENOL) 325 MG tablet            diphenhydrAMINE (BENADRYL) 50 MG/ML injection            famotidine (PEPCID) 20-0.9 MG/50ML-% IVPB            palonosetron (ALOXI) 0.25 MG/5ML injection             PHYSICAL EXAMINATION: ECOG PERFORMANCE STATUS: 1 - Symptomatic but completely ambulatory  Vitals:   02/06/22 1332  BP: (!) 143/52  Pulse: (!) 57  Resp: 15  Temp: 97.7 F (36.5 C)  SpO2: 100%   Filed Weights   02/06/22 1332  Weight:  177 lb 12.8 oz (80.6 kg)      LABORATORY DATA:  I have reviewed the data as listed    Latest Ref Rng & Units 02/06/2022    1:07 PM 01/30/2022    9:08 AM 01/23/2022   10:23 AM  CMP  Glucose 70 - 99 mg/dL 229  153  191   BUN 8 - 23 mg/dL 31  45  44   Creatinine 0.44 - 1.00 mg/dL 1.58  2.11  1.73   Sodium 135 - 145 mmol/L 138  137  136   Potassium 3.5 - 5.1 mmol/L 4.6  4.9  4.8   Chloride 98 - 111 mmol/L 103  104  103   CO2 22 - 32 mmol/L '31  28  28   '$ Calcium 8.9 - 10.3 mg/dL 9.4  9.8  9.7   Total Protein 6.5 - 8.1 g/dL 6.0  6.4  6.1   Total Bilirubin 0.3 - 1.2 mg/dL 0.5  0.7  0.8   Alkaline Phos 38 - 126 U/L 83  87  87   AST 15 - 41 U/L 28  52  46   ALT 0 - 44 U/L '10  11  11     '$ Lab Results  Component Value Date   WBC 1.9 (L) 02/06/2022   HGB 9.0 (L) 02/06/2022   HCT 25.2 (L) 02/06/2022   MCV 107.7 (H) 02/06/2022   PLT 179 02/06/2022   NEUTROABS PENDING 02/06/2022    ASSESSMENT & PLAN:  Breast cancer of upper-outer quadrant of right female breast ([place]) 07/19/2014: Right breast cancer grade 2 IDC ER/PR negative HER2 positive Ki-67 90% treated with neoadjuvant chemo with TCHP x6 cycles followed by Herceptin maintenance, right lumpectomy had residual disease 4 cm 1/3 lymph node positive, ER/PR negative, adjuvant radiation and capecitabine completed 09/27/2015 12/06/2015: Recurrence: Right subpectoral lymph node and subcutaneous nodule, liver mass (biopsy adenocarcinoma consistent with upper GI or pancreaticobiliary primary, HER2  negative, ER negative) July 2019: Metastatic disease: TDM 1 followed by Herceptin maintenance, pertuzumab added 10/06/2018, Abraxane 12/31/2017-09/01/2018, Herceptin and pertuzumab maintenance   Prior treatment: Enhertu started 10/05/2020 switched to Herceptin maintenance 02/19/2021 switched to Enhertu at higher dosage x 3 cycles discontinued 08/01/2021 , Xeloda  started 11/12/2021 discontinued December 2023 for progression   02/04/2021: PET CT scan: Complete metabolic response, no evidence of hypermetabolic disease in the liver (mass measured 2.7 cm previously is now 1.5 cm with no uptake)    11/02/2021: CT CAP: Increase in the hepatic metastatic lesions (7.5 cm to 8.3 cm) similar size of aortocaval, periportal, gastrohepatic lymph nodes ------------------------------------------------------------------------------------------------------------------------------------------- CT CAP 01/18/2022: Progressive metastatic disease in the liver (10.7 cm x 13 cm) and lymph nodes in the upper abdomen and retroperitoneum (2.9 cm and 2.3 cm and 1.7 cm)   Current treatment: Eribulin started 01/30/2022 Eribulin toxicities:  Leukopenia: We may keep the dosage the same if ANC is greater than 1000 she may need Neulasta after day 8 if necessary. Anemia: Slightly worse since she started chemo.  Fatigue  Apparently there is a drug shortage and today.  Unable to give her treatment.  She will hopefully come back Monday for day 8 of her [place] treatment.  She is tolerating it amazingly well and we hope to continue with this drug and see that it has good response for her.    Return to clinic for [place] treatments.   No orders of the defined types were placed in this encounter.  The patient has a good understanding of the  overall plan. she agrees with it. she will call with any problems that may develop before the next visit here. Total time spent: 30 mins including face to face time and time spent for planning,  charting and co-ordination of care   Harriette Ohara, MD 02/06/22    I Gardiner Coins am acting as a Education administrator for [employer]  I have reviewed the above documentation for accuracy and completeness, and I agree with the above.

## 2022-02-07 DIAGNOSIS — D631 Anemia in chronic kidney disease: Secondary | ICD-10-CM | POA: Diagnosis not present

## 2022-02-07 DIAGNOSIS — Z853 Personal history of malignant neoplasm of breast: Secondary | ICD-10-CM | POA: Diagnosis not present

## 2022-02-07 DIAGNOSIS — N1832 Chronic kidney disease, stage 3b: Secondary | ICD-10-CM | POA: Diagnosis not present

## 2022-02-07 DIAGNOSIS — I129 Hypertensive chronic kidney disease with stage 1 through stage 4 chronic kidney disease, or unspecified chronic kidney disease: Secondary | ICD-10-CM | POA: Diagnosis not present

## 2022-02-07 DIAGNOSIS — E1122 Type 2 diabetes mellitus with diabetic chronic kidney disease: Secondary | ICD-10-CM | POA: Diagnosis not present

## 2022-02-08 DIAGNOSIS — E113513 Type 2 diabetes mellitus with proliferative diabetic retinopathy with macular edema, bilateral: Secondary | ICD-10-CM | POA: Diagnosis not present

## 2022-02-08 DIAGNOSIS — H3582 Retinal ischemia: Secondary | ICD-10-CM | POA: Diagnosis not present

## 2022-02-08 DIAGNOSIS — H31093 Other chorioretinal scars, bilateral: Secondary | ICD-10-CM | POA: Diagnosis not present

## 2022-02-11 ENCOUNTER — Inpatient Hospital Stay: Payer: Medicare PPO

## 2022-02-11 ENCOUNTER — Other Ambulatory Visit: Payer: Self-pay | Admitting: Hematology and Oncology

## 2022-02-11 ENCOUNTER — Other Ambulatory Visit: Payer: Self-pay

## 2022-02-11 DIAGNOSIS — Z5111 Encounter for antineoplastic chemotherapy: Secondary | ICD-10-CM | POA: Diagnosis not present

## 2022-02-11 DIAGNOSIS — C787 Secondary malignant neoplasm of liver and intrahepatic bile duct: Secondary | ICD-10-CM | POA: Diagnosis not present

## 2022-02-11 DIAGNOSIS — Z171 Estrogen receptor negative status [ER-]: Secondary | ICD-10-CM | POA: Diagnosis not present

## 2022-02-11 DIAGNOSIS — C50411 Malignant neoplasm of upper-outer quadrant of right female breast: Secondary | ICD-10-CM

## 2022-02-11 DIAGNOSIS — Z5189 Encounter for other specified aftercare: Secondary | ICD-10-CM | POA: Diagnosis not present

## 2022-02-11 DIAGNOSIS — Z452 Encounter for adjustment and management of vascular access device: Secondary | ICD-10-CM | POA: Diagnosis not present

## 2022-02-11 LAB — CMP (CANCER CENTER ONLY)
ALT: 11 U/L (ref 0–44)
AST: 30 U/L (ref 15–41)
Albumin: 3.4 g/dL — ABNORMAL LOW (ref 3.5–5.0)
Alkaline Phosphatase: 78 U/L (ref 38–126)
Anion gap: 6 (ref 5–15)
BUN: 32 mg/dL — ABNORMAL HIGH (ref 8–23)
CO2: 27 mmol/L (ref 22–32)
Calcium: 8.6 mg/dL — ABNORMAL LOW (ref 8.9–10.3)
Chloride: 104 mmol/L (ref 98–111)
Creatinine: 1.7 mg/dL — ABNORMAL HIGH (ref 0.44–1.00)
GFR, Estimated: 32 mL/min — ABNORMAL LOW (ref >60)
Glucose, Bld: 190 mg/dL — ABNORMAL HIGH (ref 70–99)
Potassium: 4.4 mmol/L (ref 3.5–5.1)
Sodium: 137 mmol/L (ref 135–145)
Total Bilirubin: 0.5 mg/dL (ref 0.3–1.2)
Total Protein: 6 g/dL — ABNORMAL LOW (ref 6.5–8.1)

## 2022-02-11 LAB — CBC WITH DIFFERENTIAL (CANCER CENTER ONLY)
Abs Immature Granulocytes: 0.03 10*3/uL (ref 0.00–0.07)
Basophils Absolute: 0 10*3/uL (ref 0.0–0.1)
Basophils Relative: 1 %
Eosinophils Absolute: 0 10*3/uL (ref 0.0–0.5)
Eosinophils Relative: 1 %
HCT: 27.5 % — ABNORMAL LOW (ref 36.0–46.0)
Hemoglobin: 9.6 g/dL — ABNORMAL LOW (ref 12.0–15.0)
Immature Granulocytes: 2 %
Lymphocytes Relative: 33 %
Lymphs Abs: 0.6 10*3/uL — ABNORMAL LOW (ref 0.7–4.0)
MCH: 36.6 pg — ABNORMAL HIGH (ref 26.0–34.0)
MCHC: 34.9 g/dL (ref 30.0–36.0)
MCV: 105 fL — ABNORMAL HIGH (ref 80.0–100.0)
Monocytes Absolute: 0.8 10*3/uL (ref 0.1–1.0)
Monocytes Relative: 40 %
Neutro Abs: 0.5 10*3/uL — ABNORMAL LOW (ref 1.7–7.7)
Neutrophils Relative %: 23 %
Platelet Count: 212 10*3/uL (ref 150–400)
RBC: 2.62 MIL/uL — ABNORMAL LOW (ref 3.87–5.11)
RDW: 15.1 % (ref 11.5–15.5)
WBC Count: 2 10*3/uL — ABNORMAL LOW (ref 4.0–10.5)
nRBC: 0 % (ref 0.0–0.2)

## 2022-02-11 MED ORDER — PEGFILGRASTIM-CBQV 6 MG/0.6ML ~~LOC~~ SOSY
6.0000 mg | PREFILLED_SYRINGE | Freq: Once | SUBCUTANEOUS | Status: AC
  Administered 2022-02-11: 6 mg via SUBCUTANEOUS
  Filled 2022-02-11: qty 0.6

## 2022-02-11 NOTE — Patient Instructions (Signed)
Darlene [name]  Discharge Instructions: Thank you for choosing [facility] to provide your oncology and hematology care.   If you have a lab appointment with the [facility], please go directly to the [facility] and check in at the registration area.   Wear comfortable clothing and clothing appropriate for easy access to any Portacath or PICC line.   We strive to give you quality time with your provider. You may need to reschedule your appointment if you arrive late (15 or more minutes).  Arriving late affects you and other patients whose appointments are after yours.  Also, if you miss three or more appointments without notifying the office, you may be dismissed from the clinic at the provider's discretion.      For prescription refill requests, have your pharmacy contact our office and allow 72 hours for refills to be completed.    Today you received the following chemotherapy and/or immunotherapy agents haloven      To help prevent nausea and vomiting after your treatment, we encourage you to take your nausea medication as directed.  BELOW ARE SYMPTOMS THAT SHOULD BE REPORTED IMMEDIATELY: *FEVER GREATER THAN 100.4 F (38 C) OR HIGHER *CHILLS OR SWEATING *NAUSEA AND VOMITING THAT IS NOT CONTROLLED WITH YOUR NAUSEA MEDICATION *UNUSUAL SHORTNESS OF BREATH *UNUSUAL BRUISING OR BLEEDING *URINARY PROBLEMS (pain or burning when urinating, or frequent urination) *BOWEL PROBLEMS (unusual diarrhea, constipation, pain near the anus) TENDERNESS IN MOUTH AND THROAT WITH OR WITHOUT PRESENCE OF ULCERS (sore throat, sores in mouth, or a toothache) UNUSUAL RASH, SWELLING OR PAIN  UNUSUAL VAGINAL DISCHARGE OR ITCHING   Items with * indicate a potential emergency and should be followed up as soon as possible or go to the Emergency Department if any problems should occur.  Please show the CHEMOTHERAPY ALERT CARD or IMMUNOTHERAPY ALERT CARD at check-in  to the Emergency Department and triage nurse.  Should you have questions after your visit or need to cancel or reschedule your appointment, please contact [facility]  Dept: [phone]  and follow the prompts.  Office hours are 8:00 a.m. to 4:30 p.m. Monday - Friday. Please note that voicemails left after 4:00 p.m. may not be returned until the following business day.  We are closed weekends and major holidays. You have access to a nurse at all times for urgent questions. Please call the main number to the clinic Dept: [phone] and follow the prompts.   For any non-urgent questions, you may also contact your provider using MyChart. We now offer e-Visits for anyone 59 and older to request care online for non-urgent symptoms. For details visit [email or web address].   Also download the MyChart app! Go to the app store, search "MyChart", open the app, select [facility], and log in with your MyChart username and password.

## 2022-02-12 ENCOUNTER — Telehealth: Payer: Self-pay | Admitting: Hematology and Oncology

## 2022-02-12 NOTE — Telephone Encounter (Signed)
Scheduled appointment per WQ. Left voicemail.

## 2022-02-13 ENCOUNTER — Ambulatory Visit: Payer: Medicare PPO | Admitting: Podiatry

## 2022-02-13 ENCOUNTER — Encounter: Payer: Self-pay | Admitting: Podiatry

## 2022-02-13 DIAGNOSIS — E1149 Type 2 diabetes mellitus with other diabetic neurological complication: Secondary | ICD-10-CM

## 2022-02-13 DIAGNOSIS — M79674 Pain in right toe(s): Secondary | ICD-10-CM

## 2022-02-13 DIAGNOSIS — B351 Tinea unguium: Secondary | ICD-10-CM | POA: Diagnosis not present

## 2022-02-13 DIAGNOSIS — M79675 Pain in left toe(s): Secondary | ICD-10-CM | POA: Diagnosis not present

## 2022-02-13 DIAGNOSIS — E1140 Type 2 diabetes mellitus with diabetic neuropathy, unspecified: Secondary | ICD-10-CM

## 2022-02-13 NOTE — Progress Notes (Signed)
This patient returns to my office for at risk foot care.  This patient requires this care by a professional since this patient will be at risk due to having diabetic neuropathy.    This patient is unable to cut nails herself since the patient cannot reach her nails.These nails are painful walking and wearing shoes.  This patient presents for at risk foot care today.  General Appearance  Alert, conversant and in no acute stress.  Vascular  Dorsalis pedis and posterior tibial  pulses are palpable  bilaterally.  Capillary return is within normal limits  bilaterally. Temperature is within normal limits  bilaterally.  Neurologic  Senn-Weinstein monofilament wire test absent  bilaterally. Muscle power within normal limits bilaterally.  Nails Thick disfigured discolored nails with subungual debris  hallux toenails bilaterally.   No evidence of bacterial infection or drainage bilaterally.  Orthopedic  No limitations of motion  feet .  No crepitus or effusions noted.  No bony pathology or digital deformities noted.  HAV.    Hammer toes  Skin  normotropic skin with no porokeratosis noted bilaterally.  No signs of infections or ulcers noted.     Onychomycosis  Pain in right toes  Pain in left toes  HAV  B/L  Hammer toes  B/L.  Consent was obtained for treatment procedures.   Mechanical debridement of nails 1-5  bilaterally performed with a nail nipper.  Filed with dremel without incident.     Return office visit   12  weeks                  Told patient to return for periodic foot care and evaluation due to potential at risk complications.   Gardiner Barefoot DPM

## 2022-02-15 ENCOUNTER — Encounter: Payer: Self-pay | Admitting: Internal Medicine

## 2022-02-18 DIAGNOSIS — Z85828 Personal history of other malignant neoplasm of skin: Secondary | ICD-10-CM | POA: Diagnosis not present

## 2022-02-18 DIAGNOSIS — Z08 Encounter for follow-up examination after completed treatment for malignant neoplasm: Secondary | ICD-10-CM | POA: Diagnosis not present

## 2022-02-18 DIAGNOSIS — L821 Other seborrheic keratosis: Secondary | ICD-10-CM | POA: Diagnosis not present

## 2022-02-18 DIAGNOSIS — L814 Other melanin hyperpigmentation: Secondary | ICD-10-CM | POA: Diagnosis not present

## 2022-02-18 DIAGNOSIS — D225 Melanocytic nevi of trunk: Secondary | ICD-10-CM | POA: Diagnosis not present

## 2022-02-20 ENCOUNTER — Ambulatory Visit: Payer: Medicare PPO

## 2022-02-20 ENCOUNTER — Other Ambulatory Visit: Payer: Medicare PPO

## 2022-02-20 ENCOUNTER — Ambulatory Visit: Payer: Medicare PPO | Admitting: Hematology and Oncology

## 2022-02-25 ENCOUNTER — Inpatient Hospital Stay (HOSPITAL_BASED_OUTPATIENT_CLINIC_OR_DEPARTMENT_OTHER): Payer: Medicare PPO | Admitting: Adult Health

## 2022-02-25 ENCOUNTER — Other Ambulatory Visit: Payer: Self-pay | Admitting: Hematology and Oncology

## 2022-02-25 ENCOUNTER — Ambulatory Visit (INDEPENDENT_AMBULATORY_CARE_PROVIDER_SITE_OTHER): Payer: Medicare PPO | Admitting: Family Medicine

## 2022-02-25 ENCOUNTER — Inpatient Hospital Stay: Payer: Medicare PPO

## 2022-02-25 ENCOUNTER — Inpatient Hospital Stay: Payer: Medicare PPO | Attending: Hematology and Oncology

## 2022-02-25 ENCOUNTER — Encounter: Payer: Self-pay | Admitting: Adult Health

## 2022-02-25 VITALS — BP 133/59 | HR 87 | Temp 98.0°F | Resp 16 | Ht 69.0 in | Wt 177.6 lb

## 2022-02-25 DIAGNOSIS — R1112 Projectile vomiting: Secondary | ICD-10-CM

## 2022-02-25 DIAGNOSIS — C787 Secondary malignant neoplasm of liver and intrahepatic bile duct: Secondary | ICD-10-CM

## 2022-02-25 DIAGNOSIS — Z5111 Encounter for antineoplastic chemotherapy: Secondary | ICD-10-CM | POA: Diagnosis not present

## 2022-02-25 DIAGNOSIS — C50411 Malignant neoplasm of upper-outer quadrant of right female breast: Secondary | ICD-10-CM | POA: Diagnosis not present

## 2022-02-25 DIAGNOSIS — Z95828 Presence of other vascular implants and grafts: Secondary | ICD-10-CM

## 2022-02-25 DIAGNOSIS — Z171 Estrogen receptor negative status [ER-]: Secondary | ICD-10-CM | POA: Diagnosis not present

## 2022-02-25 DIAGNOSIS — Z5189 Encounter for other specified aftercare: Secondary | ICD-10-CM | POA: Diagnosis not present

## 2022-02-25 DIAGNOSIS — R2681 Unsteadiness on feet: Secondary | ICD-10-CM | POA: Diagnosis not present

## 2022-02-25 DIAGNOSIS — R269 Unspecified abnormalities of gait and mobility: Secondary | ICD-10-CM | POA: Diagnosis not present

## 2022-02-25 LAB — CBC WITH DIFFERENTIAL (CANCER CENTER ONLY)
Abs Immature Granulocytes: 0.09 10*3/uL — ABNORMAL HIGH (ref 0.00–0.07)
Basophils Absolute: 0 10*3/uL (ref 0.0–0.1)
Basophils Relative: 0 %
Eosinophils Absolute: 0 10*3/uL (ref 0.0–0.5)
Eosinophils Relative: 0 %
HCT: 29.9 % — ABNORMAL LOW (ref 36.0–46.0)
Hemoglobin: 10 g/dL — ABNORMAL LOW (ref 12.0–15.0)
Immature Granulocytes: 1 %
Lymphocytes Relative: 6 %
Lymphs Abs: 0.9 10*3/uL (ref 0.7–4.0)
MCH: 34.7 pg — ABNORMAL HIGH (ref 26.0–34.0)
MCHC: 33.4 g/dL (ref 30.0–36.0)
MCV: 103.8 fL — ABNORMAL HIGH (ref 80.0–100.0)
Monocytes Absolute: 0.9 10*3/uL (ref 0.1–1.0)
Monocytes Relative: 6 %
Neutro Abs: 13.3 10*3/uL — ABNORMAL HIGH (ref 1.7–7.7)
Neutrophils Relative %: 87 %
Platelet Count: 233 10*3/uL (ref 150–400)
RBC: 2.88 MIL/uL — ABNORMAL LOW (ref 3.87–5.11)
RDW: 15.7 % — ABNORMAL HIGH (ref 11.5–15.5)
WBC Count: 15.2 10*3/uL — ABNORMAL HIGH (ref 4.0–10.5)
nRBC: 0 % (ref 0.0–0.2)

## 2022-02-25 LAB — CMP (CANCER CENTER ONLY)
ALT: 18 U/L (ref 0–44)
AST: 57 U/L — ABNORMAL HIGH (ref 15–41)
Albumin: 3.4 g/dL — ABNORMAL LOW (ref 3.5–5.0)
Alkaline Phosphatase: 182 U/L — ABNORMAL HIGH (ref 38–126)
Anion gap: 9 (ref 5–15)
BUN: 53 mg/dL — ABNORMAL HIGH (ref 8–23)
CO2: 26 mmol/L (ref 22–32)
Calcium: 9 mg/dL (ref 8.9–10.3)
Chloride: 100 mmol/L (ref 98–111)
Creatinine: 1.79 mg/dL — ABNORMAL HIGH (ref 0.44–1.00)
GFR, Estimated: 30 mL/min — ABNORMAL LOW (ref >60)
Glucose, Bld: 265 mg/dL — ABNORMAL HIGH (ref 70–99)
Potassium: 4.9 mmol/L (ref 3.5–5.1)
Sodium: 135 mmol/L (ref 135–145)
Total Bilirubin: 0.5 mg/dL (ref 0.3–1.2)
Total Protein: 6.2 g/dL — ABNORMAL LOW (ref 6.5–8.1)

## 2022-02-25 LAB — MAGNESIUM: Magnesium: 2 mg/dL (ref 1.7–2.4)

## 2022-02-25 LAB — CEA (IN HOUSE-CHCC): CEA (CHCC-In House): 3654.17 ng/mL — ABNORMAL HIGH (ref 0.00–5.00)

## 2022-02-25 MED ORDER — SODIUM CHLORIDE 0.9 % IV SOLN
1.1000 mg/m2 | Freq: Once | INTRAVENOUS | Status: AC
  Administered 2022-02-25: 2 mg via INTRAVENOUS
  Filled 2022-02-25: qty 4

## 2022-02-25 MED ORDER — SODIUM CHLORIDE 0.9% FLUSH
10.0000 mL | Freq: Once | INTRAVENOUS | Status: AC
  Administered 2022-02-25: 10 mL

## 2022-02-25 MED ORDER — ONDANSETRON HCL 4 MG/2ML IJ SOLN
8.0000 mg | Freq: Once | INTRAMUSCULAR | Status: AC
  Administered 2022-02-25: 8 mg via INTRAVENOUS
  Filled 2022-02-25: qty 4

## 2022-02-25 MED ORDER — SODIUM CHLORIDE 0.9% FLUSH
10.0000 mL | INTRAVENOUS | Status: DC | PRN
  Administered 2022-02-25: 10 mL

## 2022-02-25 MED ORDER — SODIUM CHLORIDE 0.9 % IV SOLN: 8.0000 mg | Freq: Once | INTRAVENOUS | Status: DC

## 2022-02-25 MED ORDER — SODIUM CHLORIDE 0.9 % IV SOLN: Freq: Once | INTRAVENOUS | Status: AC

## 2022-02-25 MED ORDER — HEPARIN SOD (PORK) LOCK FLUSH 100 UNIT/ML IV SOLN
500.0000 [IU] | Freq: Once | INTRAVENOUS | Status: AC | PRN
  Administered 2022-02-25: 500 [IU]

## 2022-02-25 NOTE — Patient Instructions (Signed)
[facility]  Discharge Instructions: Thank you for choosing [facility] to provide your oncology and hematology care.   If you have a lab appointment with the Cancer Center, please go directly to the Cancer Center and check in at the registration area.   Wear comfortable clothing and clothing appropriate for easy access to any Portacath or PICC line.   We strive to give you quality time with your provider. You may need to reschedule your appointment if you arrive late (15 or more minutes).  Arriving late affects you and other patients whose appointments are after yours.  Also, if you miss three or more appointments without notifying the office, you may be dismissed from the clinic at the provider's discretion.      For prescription refill requests, have your pharmacy contact our office and allow 72 hours for refills to be completed.    Today you received the following chemotherapy and/or immunotherapy agents: Halaven.       To help prevent nausea and vomiting after your treatment, we encourage you to take your nausea medication as directed.  BELOW ARE SYMPTOMS THAT SHOULD BE REPORTED IMMEDIATELY: *FEVER GREATER THAN 100.4 F (38 C) OR HIGHER *CHILLS OR SWEATING *NAUSEA AND VOMITING THAT IS NOT CONTROLLED WITH YOUR NAUSEA MEDICATION *UNUSUAL SHORTNESS OF BREATH *UNUSUAL BRUISING OR BLEEDING *URINARY PROBLEMS (pain or burning when urinating, or frequent urination) *BOWEL PROBLEMS (unusual diarrhea, constipation, pain near the anus) TENDERNESS IN MOUTH AND THROAT WITH OR WITHOUT PRESENCE OF ULCERS (sore throat, sores in mouth, or a toothache) UNUSUAL RASH, SWELLING OR PAIN  UNUSUAL VAGINAL DISCHARGE OR ITCHING   Items with * indicate a potential emergency and should be followed up as soon as possible or go to the Emergency Department if any problems should occur.  Please show the CHEMOTHERAPY ALERT CARD or IMMUNOTHERAPY ALERT CARD at  check-in to the Emergency Department and triage nurse.  Should you have questions after your visit or need to cancel or reschedule your appointment, please contact [facility]  Dept: [phone]  and follow the prompts.  Office hours are 8:00 a.m. to 4:30 p.m. Monday - Friday. Please note that voicemails left after 4:00 p.m. may not be returned until the following business day.  We are closed weekends and major holidays. You have access to a nurse at all times for urgent questions. Please call the main number to the clinic Dept: [phone] and follow the prompts.   For any non-urgent questions, you may also contact your provider using MyChart. We now offer e-Visits for anyone 18 and older to request care online for non-urgent symptoms. For details visit [email or web address].   Also download the MyChart app! Go to the app store, search "MyChart", open the app, select [facility], and log in with your MyChart username and password.   

## 2022-02-25 NOTE — Assessment & Plan Note (Signed)
Darlene [name] is a 72 year old woman with metastatic breast cancer here today for follow-up and evaluation prior to receiving her second cycle of her revealing chemotherapy.  [name] and I reviewed her labs today and she is okay to proceed with cycle 2 of her chemotherapy.  We reviewed that this cycle she will receive on day 1 and day 8 and receive Udenyca on day 9 of therapy.  I am concerned about this vomiting she is experiencing without feeling nauseated.  I have ordered an MRI of the brain to rule out recurrence.  Will return in 1 week for day 8 of her therapy and in 8 days for her injection.  We discussed her tiredness and this is likely multifactorial.  She will continue to do small amounts of exercise and she will continue to work with physical therapy on her mobility due to her recent fall.

## 2022-02-25 NOTE — Progress Notes (Signed)
Per Mendel Ryder NP, ok to treat with SCR 1.79

## 2022-02-25 NOTE — Progress Notes (Signed)
[facility] up:    Darlene [name], Darlene Halsted, MD [place] [address]   DIAGNOSIS:  Cancer Staging  Breast cancer of upper-outer quadrant of right female breast [facility]) Staging form: Breast, AJCC 7th Edition - Clinical: Stage IIA (T1c, N1, M0) - Signed by Chauncey Cruel, MD on 07/27/2014 - Pathologic stage from 12/29/2014: Stage IIB (yT2, N1a, cM0) - Signed by Enid Cutter, MD on 01/24/2015 Staged by: Pathologist Stage prefix: Post-therapy Laterality: Right Estrogen receptor status: Negative Progesterone receptor status: Negative HER2 status: Positive Stage used in treatment planning: Yes National guidelines used in treatment planning: Yes Type of national guideline used in treatment planning: NCCN Staging comments: Staged on surgical specimen by Dr. Avis Epley   SUMMARY OF ONCOLOGIC HISTORY: Oncology History  Breast cancer of upper-outer quadrant of right female breast ([place])  07/19/2014 Initial Biopsy   (R) breast needle biopsy (10:00): IDC, DCIS, grade 2. ER-, PR-, HER2+ (ratio 2.42). Ki67 90%.    07/22/2014 Initial Diagnosis   Breast cancer of upper-outer quadrant of right female breast   08/08/2014 - 11/21/2014 Neo-Adjuvant Chemotherapy   Taxotere/Carbo/Herceptin/Perjeta x 6 cycles.    12/12/2014 - 10/02/2015 Chemotherapy   Maintenance Herceptin (to complete 1 year of therapy).    12/27/2014 Surgery   (R) lumpectomy with SL[facility]): IDC, grade 2, spanning 4 cm, associated high grade DCIS. Negative margins. 1/3 SLN (+).   ER/PR repeated and remain negative.   ypT2, ypN1a: Stage IIB   01/06/2015 Surgery   Bilateral breast mammoplasty ([place]): Right breast path with intralymphatic emboli of ductal carcinoma. Left breast benign.    02/02/2015 Imaging   CT chest: No findings of metastatic disease in the chest. Right axillary fluid density lesion likely postoperative seroma or hematoma. Small left subpectoral nodes warrant  followup attention.   02/02/2015 Imaging   Bone scan: No scintigraphic evidence of osseous metastatic disease   02/16/2015 Surgery   (R) mastectomy and ALND [facility]): Scattered foci of high grade DCIS, scattered intralymphatic tumor emboli. Margins negative. 0/15 LNs.    04/05/2015 - 05/19/2015 Radiation Therapy   Adjuvant XRT ([place]). The right chest wall was treated to 50.4 Gy in 28 fractions at 1.8 Gy per fraction.  The right mastectomy scar was treated to 10 Gy in 5 fractions at 2 Gy per fraction.    12/06/2015 PET scan   Right subpectoral lymph node measuring 9 mm and exhibits malignant range FDG uptake, suspicious for metastatic adenopathy. Subcutaneous nodule within the upper left chest wall exhibits malignant range uptake, which is indeterminate; this is at the site of previous Port-A-Cath and is favored to represent postsurgical change. Indeterminate left adrenal nodules which exhibit mild uptake, favored to represent benign adenoma.   08/11/2017 - 11/03/2017 Chemotherapy   The patient had ado-trastuzumab emtansine (KADCYLA) 320 mg in sodium chloride 0.9 % 250 mL chemo infusion, 3.6 mg/kg = 320 mg, Intravenous, Once, 4 of 5 cycles Administration: 320 mg (08/12/2017), 320 mg (09/01/2017), 320 mg (09/23/2017)  for chemotherapy treatment.    11/18/2017 - 08/30/2020 Chemotherapy   The patient had trastuzumab (HERCEPTIN) 546 mg in sodium chloride 0.9 % 250 mL chemo infusion, 6 mg/kg = 546 mg (100 % of original dose 6 mg/kg), Intravenous,  Once, 2 of 2 cycles Dose modification: 6 mg/kg (original dose 6 mg/kg, Cycle 1, Reason: Provider Judgment) Administration: 546 mg (11/18/2017), 546 mg (12/16/2017) pertuzumab (PERJETA) 420 mg in sodium chloride 0.9 % 250 mL chemo infusion, 420 mg (100 %  of original dose 420 mg), Intravenous, Once, 15 of 15 cycles Dose modification: 420 mg (original dose 420 mg, Cycle 18), 420 mg (original dose 420 mg, Cycle 27) Administration: 420 mg (10/06/2018), 420 mg  (10/27/2018), 420 mg (11/18/2018), 420 mg (12/08/2018), 420 mg (12/29/2018), 420 mg (01/19/2019), 420 mg (02/16/2019), 420 mg (03/16/2019), 420 mg (04/13/2019), 420 mg (06/08/2019), 420 mg (07/06/2019), 420 mg (08/03/2019), 420 mg (08/31/2019), 420 mg (09/28/2019), 420 mg (05/19/2019) trastuzumab-anns (KANJINTI) 546 mg in sodium chloride 0.9 % 250 mL chemo infusion, 6 mg/kg = 546 mg (100 % of original dose 6 mg/kg), Intravenous,  Once, 30 of 30 cycles Dose modification: 6 mg/kg (original dose 6 mg/kg, Cycle 3, Reason: Other (see comments), Comment: requested step therapy by [facility] insurance), 4 mg/kg (original dose 6 mg/kg, Cycle 9, Reason: Provider Judgment), 6 mg/kg (original dose 6 mg/kg, Cycle 17, Reason: Provider Judgment), 4 mg/kg (original dose 6 mg/kg, Cycle 27, Reason: Provider Judgment), 6 mg/kg (original dose 6 mg/kg, Cycle 27, Reason: Provider Judgment) Administration: 546 mg (01/13/2018), 546 mg (02/10/2018), 546 mg (03/03/2018), 546 mg (03/24/2018), 546 mg (04/14/2018), 546 mg (05/05/2018), 357 mg (05/26/2018), 357 mg (06/09/2018), 357 mg (06/23/2018), 357 mg (07/07/2018), 357 mg (07/21/2018), 357 mg (08/04/2018), 357 mg (08/18/2018), 357 mg (09/01/2018), 546 mg (09/15/2018), 546 mg (10/06/2018), 546 mg (10/27/2018), 546 mg (11/18/2018), 546 mg (12/08/2018), 546 mg (12/29/2018), 546 mg (01/19/2019), 546 mg (02/16/2019), 480 mg (03/16/2019), 483 mg (04/13/2019), 450 mg (06/08/2019), 450 mg (07/06/2019), 450 mg (08/03/2019), 450 mg (08/31/2019), 450 mg (09/28/2019)  for chemotherapy treatment.    12/31/2017 - 09/01/2018 Chemotherapy   The patient had PACLitaxel-protein bound (ABRAXANE) chemo infusion 175 mg, 80 mg/m2 = 175 mg (100 % of original dose 80 mg/m2), Intravenous,  Once, 5 of 10 cycles Dose modification: 80 mg/m2 (original dose 80 mg/m2, Cycle 11) Administration: 175 mg (07/07/2018), 175 mg (07/21/2018), 175 mg (08/04/2018), 175 mg (08/18/2018), 175 mg (09/01/2018) PACLitaxel-protein bound (ABRAXANE) chemo infusion 200 mg, 100 mg/m2 =  200 mg, Intravenous, Once, 10 of 10 cycles Dose modification: 80 mg/m2 (original dose 100 mg/m2, Cycle 2, Reason: Provider Judgment), 80 mg/m2 (original dose 100 mg/m2, Cycle 2, Reason: Provider Judgment) Administration: 200 mg (12/31/2017), 200 mg (01/06/2018), 175 mg (01/20/2018), 175 mg (01/27/2018), 175 mg (02/10/2018), 175 mg (03/03/2018), 175 mg (03/10/2018), 175 mg (03/24/2018), 175 mg (04/07/2018), 175 mg (04/14/2018), 175 mg (04/28/2018), 175 mg (05/05/2018), 175 mg (05/26/2018), 175 mg (06/09/2018), 175 mg (05/19/2018)  for chemotherapy treatment.    10/05/2020 - 01/18/2021 Chemotherapy   Patient is on Treatment Plan : BREAST METASTATIC fam-trastuzumab deruxtecan-nxki (Enhertu) q21d     02/19/2021 - 05/14/2021 Chemotherapy   Patient is on Treatment Plan : BREAST Trastuzumab q21d x 13 cycles     06/20/2021 - 08/01/2021 Chemotherapy   Patient is on Treatment Plan : BREAST METASTATIC fam-trastuzumab deruxtecan-nxki (Enhertu) q21d     07/25/2021 - 10/19/2021 Chemotherapy   Patient is on Treatment Plan : BREAST METASTATIC Sacituzumab govitecan-hziy Ivette Loyal) D1,8 q21d     08/31/2021 - 09/21/2021 Chemotherapy   Patient is on Treatment Plan : BREAST METASTATIC Sacituzumab govitecan-hziy Ivette Loyal) q21d     01/30/2022 -  Chemotherapy   Patient is on Treatment Plan : BREAST METASTATIC Eribulin D1,8 q21d     Metastasis to liver ([place])  08/04/2017 Initial Diagnosis   Metastasis to liver ([place])   08/12/2017 - 10/14/2017 Chemotherapy   The patient had ado-trastuzumab emtansine (KADCYLA) 320 mg in sodium chloride 0.9 % 250 mL chemo infusion, 3.6 mg/kg = 320  mg, Intravenous, Once, 4 of 5 cycles Administration: 320 mg (08/12/2017), 320 mg (09/01/2017), 320 mg (09/23/2017)  for chemotherapy treatment.    11/18/2017 - 08/30/2020 Chemotherapy   The patient had trastuzumab (HERCEPTIN) 546 mg in sodium chloride 0.9 % 250 mL chemo infusion, 6 mg/kg = 546 mg (100 % of original dose 6 mg/kg), Intravenous,  Once, 2 of 2 cycles Dose  modification: 6 mg/kg (original dose 6 mg/kg, Cycle 1, Reason: Provider Judgment) Administration: 546 mg (11/18/2017), 546 mg (12/16/2017) pertuzumab (PERJETA) 420 mg in sodium chloride 0.9 % 250 mL chemo infusion, 420 mg (100 % of original dose 420 mg), Intravenous, Once, 15 of 15 cycles Dose modification: 420 mg (original dose 420 mg, Cycle 18), 420 mg (original dose 420 mg, Cycle 27) Administration: 420 mg (10/06/2018), 420 mg (10/27/2018), 420 mg (11/18/2018), 420 mg (12/08/2018), 420 mg (12/29/2018), 420 mg (01/19/2019), 420 mg (02/16/2019), 420 mg (03/16/2019), 420 mg (04/13/2019), 420 mg (06/08/2019), 420 mg (07/06/2019), 420 mg (08/03/2019), 420 mg (08/31/2019), 420 mg (09/28/2019), 420 mg (05/19/2019) trastuzumab-anns (KANJINTI) 546 mg in sodium chloride 0.9 % 250 mL chemo infusion, 6 mg/kg = 546 mg (100 % of original dose 6 mg/kg), Intravenous,  Once, 30 of 30 cycles Dose modification: 6 mg/kg (original dose 6 mg/kg, Cycle 3, Reason: Other (see comments), Comment: requested step therapy by [facility] insurance), 4 mg/kg (original dose 6 mg/kg, Cycle 9, Reason: Provider Judgment), 6 mg/kg (original dose 6 mg/kg, Cycle 17, Reason: Provider Judgment), 4 mg/kg (original dose 6 mg/kg, Cycle 27, Reason: Provider Judgment), 6 mg/kg (original dose 6 mg/kg, Cycle 27, Reason: Provider Judgment) Administration: 546 mg (01/13/2018), 546 mg (02/10/2018), 546 mg (03/03/2018), 546 mg (03/24/2018), 546 mg (04/14/2018), 546 mg (05/05/2018), 357 mg (05/26/2018), 357 mg (06/09/2018), 357 mg (06/23/2018), 357 mg (07/07/2018), 357 mg (07/21/2018), 357 mg (08/04/2018), 357 mg (08/18/2018), 357 mg (09/01/2018), 546 mg (09/15/2018), 546 mg (10/06/2018), 546 mg (10/27/2018), 546 mg (11/18/2018), 546 mg (12/08/2018), 546 mg (12/29/2018), 546 mg (01/19/2019), 546 mg (02/16/2019), 480 mg (03/16/2019), 483 mg (04/13/2019), 450 mg (06/08/2019), 450 mg (07/06/2019), 450 mg (08/03/2019), 450 mg (08/31/2019), 450 mg (09/28/2019)  for chemotherapy treatment.    01/30/2022 -   Chemotherapy   Patient is on Treatment Plan : BREAST METASTATIC Eribulin D1,8 q21d       CURRENT THERAPY: Eribulin cycle 2 day 1  INTERVAL HISTORY: Darlene [name] 72 y.o. female returns for follow-up and evaluation prior to receiving cycle 2 of Eribulin chemotherapy.  Darlene [name] fell recently and feels like she bruised her ribs from this fall.  She has since been going to physical therapy.  She was unable to receive cycle 1 day 8 of therapy due to neutropenia.  Darlene [name] has been increasingly tired since we saw her last.  She also tells me that she has been vomiting without nausea and it just comes on and she vomits.  She denies any new pain.   Patient Active Problem List   Diagnosis Date Noted   Diabetes mellitus ([place]) 11/01/2021   Class 1 obesity with serious comorbidity and body mass index (BMI) of 33.0 to 33.9 in adult 11/01/2021   Hereditary and idiopathic neuropathy, unspecified 09/20/2021   Proteinuria 09/20/2021   Constipation due to outlet dysfunction    Stress 07/31/2021   Abnormal findings on diagnostic imaging of other parts of digestive tract 02/09/2021   Allergic rhinitis 02/09/2021   Arthritis of right knee 02/09/2021   Diverticular disease of colon 02/09/2021   Gallstones 02/09/2021   Hyperglycemia due  to type 2 diabetes mellitus ([place]) 02/09/2021   Neuropathy 02/09/2021   Pure hypercholesterolemia 02/09/2021   Shoulder joint pain 02/09/2021   Thyroid nodule 02/09/2021   Vitamin B12 deficiency 02/09/2021   CKD (chronic kidney disease) stage 4, GFR 15-29 ml/min (HCC) 09/27/2020   Hammer toe of second toe of right foot 11/16/2018   Hallux valgus of right foot 11/16/2018   Essential hypertension 11/04/2018   Pain due to onychomycosis of toenails of both feet 10/14/2018   Pre-ulcerative calluses 10/14/2018   Diabetic retinal edema ([place]) 07/19/2018   Diabetic retinopathy of both eyes associated with type 2 diabetes mellitus ([place]) 06/28/2018   Goals of care,  counseling/discussion 12/30/2017   Occult malignancy ([place]) 12/01/2017   Neoplastic malignant related fatigue 09/29/2017   Metastasis to liver ([place]) 08/04/2017   Lesion of liver 07/30/2017   Type 2 diabetes mellitus without complication, with long-term current use of insulin ([place]) 03/11/2017   Vitamin D deficiency 02/24/2017   Aortic atherosclerosis ([place]) 01/28/2017   Type 2 diabetes mellitus without complication, without long-term current use of insulin ([place]) 12/18/2016   Class 1 obesity with serious comorbidity and body mass index (BMI) of 30.0 to 30.9 in adult 12/18/2016   Port catheter in place 08/21/2015   Acute appendicitis 07/22/2015   Appendicitis with peritonitis 07/22/2015   Acquired absence of right breast and nipple 07/05/2015   History of therapeutic radiation 07/05/2015   Recurrent breast cancer, right ([place]) 02/15/2015   Chemotherapy-induced peripheral neuropathy ([place]) 02/08/2015   Macromastia 01/06/2015   Anemia in neoplastic disease 12/21/2014   Cough    Breast cancer of upper-outer quadrant of right female breast ([place]) 07/22/2014   OSA on CPAP 06/02/2013   Bariatric surgery status 06/02/2013   Type 2 diabetes mellitus with diabetic neuropathy, with long-term current use of insulin ([place]) 06/02/2013   Lap [place] Y Gastric Bypass Oct 2014 11/03/2012   Depression 09/10/2012   hyperlipidemia 09/10/2012    is allergic to antihistamines, diphenhydramine-type; nsaids; and tape.  MEDICAL HISTORY: Past Medical History:  Diagnosis Date   Anemia    hx of - during 1st round chemo   Anxiety    Arthritis    in fingers, shoulders   Back pain    Balance problem    Breast cancer (HCC)    Breast cancer of upper-outer quadrant of right female breast ([place]) 07/22/2014   Bunion, right foot    CHF (congestive heart failure) (HCC)    Chronic kidney disease    creatininei levels high per pt    Clumsiness    Constipation    Depression    Diabetes mellitus without complication ([employer])     73+ years type 2    Diabetic retinopathy ([place])    torn retina   Eating disorder    binge eating   Epistaxis 08/26/2014   Fatty liver    Foot drop, left foot    HCAP (healthcare-associated pneumonia) 12/18/2014   Heart murmur    never had any problems   History of radiation therapy 04/05/15-05/19/15   right chest wall was treated to 50.4 Gy in 28 fractions, right mastectomy scar was treated to 10 Gy in 5 fractions   Hyperlipidemia    Hypertension    Joint pain    Multiple food allergies    Neuromuscular disorder ([place])    diabetic neuropathy   Obesity    Obstructive sleep apnea on CPAP    does not use cpap all the time has lost 160 lbs  Ovarian cyst rupture    possible   Pneumonia    Pneumonia 11/2014   Radiation 04/05/15-05/19/15   right chest wall 50.4 Gy, mastectomy scar 10 Gy   Sleep apnea    Thyroid nodule 06/2014    SURGICAL HISTORY: Past Surgical History:  Procedure Laterality Date   ANAL RECTAL MANOMETRY N/A 08/01/2021   Procedure: ANO RECTAL MANOMETRY;  Surgeon: Thornton Park, MD;  Location: [facility];  Service: Gastroenterology;  Laterality: N/A;   APPENDECTOMY     BIOPSY THYROID Bilateral 06/2014   2 nodules - Benign    BREAST LUMPECTOMY WITH RADIOACTIVE SEED AND SENTINEL LYMPH NODE BIOPSY Right 12/27/2014   Procedure: BREAST LUMPECTOMY WITH RADIOACTIVE SEED AND SENTINEL LYMPH NODE BIOPSY;  Surgeon: Stark Klein, MD;  Location: [place];  Service: General;  Laterality: Right;   BREAST REDUCTION SURGERY Bilateral 01/06/2015   Procedure: BILATERAL BREAST REDUCTION, RIGHT ONCOPLASTIC RECONSTRUCTION),LEFT BREAST REDUCTION FOR SYMMETRY;  Surgeon: [name], MD;  Location: [place];  Service: Plastics;  Laterality: Bilateral;   BREATH TEK H PYLORI N/A 09/15/2012   Procedure: BREATH TEK H PYLORI;  Surgeon: Pedro Earls, MD;  [facility];  Service: General;  Laterality: N/A;   BUNIONECTOMY Left 12/2013   CARPAL TUNNEL RELEASE Right     CARPAL TUNNEL RELEASE Left    CATARACT EXTRACTION     6/19   COLONOSCOPY     DUPUYTREN CONTRACTURE RELEASE     GASTRIC ROUX-EN-Y N/A 11/02/2012   Procedure: LAPAROSCOPIC ROUX-EN-Y GASTRIC;  Surgeon: Pedro Earls, MD;  [facility];  Service: General;  Laterality: N/A;   IR GENERIC HISTORICAL  03/18/2016   IR US GUIDE VASC ACCESS LEFT 03/18/2016 Markus Daft, MD [facility]   IR GENERIC HISTORICAL  03/18/2016   IR FLUORO GUIDE CV LINE LEFT 03/18/2016 Markus Daft, MD [facility]   LAPAROSCOPIC APPENDECTOMY N/A 07/22/2015   Procedure: APPENDECTOMY LAPAROSCOPIC;  Surgeon: Michael Boston, MD;  [facility];  Service: General;  Laterality: N/A;   MASTECTOMY Right 02/15/2015   modified   MODIFIED MASTECTOMY Right 02/15/2015   Procedure: RIGHT MODIFIED RADICAL MASTECTOMY;  Surgeon: Stark Klein, MD;  Location: [place];  Service: General;  Laterality: Right;   ORIF WRIST FRACTURE Left 05/11/2019   Procedure: OPEN REDUCTION INTERNAL FIXATION (ORIF) left distal radius fracture;  Surgeon: Renette Butters, MD;  [facility];  Service: Orthopedics;  Laterality: Left;  with block   PORT-A-CATH REMOVAL Left 10/10/2015   Procedure: PORT REMOVAL;  Surgeon: Stark Klein, MD;  Location: [place];  Service: General;  Laterality: Left;   PORTACATH PLACEMENT Left 08/02/2014   Procedure: INSERTION PORT-A-CATH;  Surgeon: Stark Klein, MD;  Location: [place];  Service: General;  Laterality: Left;   PORTACATH PLACEMENT N/A 11/05/2016   Procedure: INSERTION PORT-A-CATH;  Surgeon: Stark Klein, MD;  Location: [place];  Service: General;  Laterality: N/A;   RETINAL LASER PROCEDURE     retina tear on left eye   ROTATOR CUFF REPAIR Right 2005   SCAR REVISION Right 12/08/2015   Procedure: COMPLEX REPAIR RIGHT CHEST 10-15CM;  Surgeon: [name], MD;  Location: [place];  Service: Plastics;  Laterality: Right;   TOE SURGERY  1970s   bone spur   TRIGGER FINGER RELEASE     x3     SOCIAL HISTORY: Social History   Socioeconomic History   Marital status: Married    Spouse name: [name]   Number of children: 0  Years of education: Bachelor's   Highest education level: Bachelor's degree (e.g., BA, AB, BS)  Occupational History   Occupation: Pensions consultant: [employer]  Tobacco Use   Smoking status: Never   Smokeless tobacco: Never  Vaping Use   Vaping Use: Never used  Substance and Sexual Activity   Alcohol use: Yes    Alcohol/week: 1.0 - 2.0 standard drink of alcohol    Types: 1 - 2 Standard drinks or equivalent per week    Comment: social   Drug use: No   Sexual activity: Yes    Partners: Male    Birth control/protection: Post-menopausal, Other-see comments    Comment: husband vasectomy  Other Topics Concern   Not on file  Social History Narrative   Patient is married Health and safety inspector) and lives at home with her husband.   Patient does not have any children.   Patient is working for a [employer].   Patient has a Bachelor's degree.   Patient is right-handed.   Patient does not drink any caffeine.      Right breast removed   Social Determinants of Health   Financial Resource Strain: Low Risk  (05/19/2021)   Overall Financial Resource Strain (CARDIA)    Difficulty of Paying Living Expenses: Not hard at all  Food Insecurity: No Food Insecurity (05/19/2021)   Hunger Vital Sign    Worried About Running Out of Food in the Last Year: Never true    Ran Out of Food in the Last Year: Never true  Transportation Needs: No Transportation Needs (05/19/2021)   PRAPARE - Hydrologist (Medical): No    Lack of Transportation (Non-Medical): No  Physical Activity: Unknown (05/19/2021)   Exercise Vital Sign    Days of Exercise per Week: 1 day    Minutes of Exercise per Session: Not on file  Stress: No Stress Concern Present (05/19/2021)   [place]    Feeling of Stress : Not at all  Social Connections: [place] (05/19/2021)   Social Connection and Isolation Panel [NHANES]    Frequency of Communication with Friends and Family: Three times a week    Frequency of Social Gatherings with Friends and Family: Twice a week    Attends Religious Services: 1 to 4 times per year    Active Member of [employer] or Organizations: Yes    Attends Music therapist: More than 4 times per year    Marital Status: Married  Human resources officer Violence: Not on file    FAMILY HISTORY: Family History  Problem Relation Age of Onset   CVA Mother    Heart disease Mother    Sudden death Mother    Diabetes Father    Heart failure Father    Hypertension Father    Kidney disease Father    Obesity Father    Hypertension Sister    Diabetes Brother    Breast cancer Paternal Grandmother 36   Diabetes Paternal Uncle    Ovarian cancer Neg Hx     Review of Systems  Constitutional:  Positive for fatigue. Negative for appetite change, chills, fever and unexpected weight change.  HENT:   Negative for hearing loss, lump/mass and trouble swallowing.   Eyes:  Negative for eye problems and icterus.  Respiratory:  Negative for chest tightness, cough and shortness of breath.   Cardiovascular:  Negative for chest pain, leg swelling and palpitations.  Gastrointestinal:  Negative for abdominal distention, abdominal pain, constipation, diarrhea, nausea and vomiting.  Endocrine: Negative for hot flashes.  Genitourinary:  Negative for difficulty urinating.   Musculoskeletal:  Negative for arthralgias.  Skin:  Negative for itching and rash.  Neurological:  Negative for dizziness, extremity weakness, headaches and numbness.  Hematological:  Negative for adenopathy. Does not bruise/bleed easily.  Psychiatric/Behavioral:  Negative for depression. The patient is not nervous/anxious.       PHYSICAL EXAMINATION  ECOG PERFORMANCE STATUS: 1 -  Symptomatic but completely ambulatory  Vitals:   02/25/22 1417  BP: (!) 133/59  Pulse: 87  Resp: 16  Temp: 98 F (36.7 C)  SpO2: 97%    Physical Exam Constitutional:      General: She is not in acute distress.    Appearance: Normal appearance. She is not toxic-appearing.  HENT:     Head: Normocephalic and atraumatic.  Eyes:     General: No scleral icterus. Cardiovascular:     Rate and Rhythm: Normal rate and regular rhythm.     Pulses: Normal pulses.     Heart sounds: Normal heart sounds.  Pulmonary:     Effort: Pulmonary effort is normal.     Breath sounds: Normal breath sounds.  Abdominal:     General: Abdomen is flat. Bowel sounds are normal. There is no distension.     Palpations: Abdomen is soft.     Tenderness: There is no abdominal tenderness.  Musculoskeletal:        General: No swelling.     Cervical back: Neck supple.  Lymphadenopathy:     Cervical: No cervical adenopathy.  Skin:    General: Skin is warm and dry.     Findings: No rash.  Neurological:     General: No focal deficit present.     Mental Status: She is alert.  Psychiatric:        Mood and Affect: Mood normal.        Behavior: Behavior normal.     LABORATORY DATA:  CBC    Component Value Date/Time   WBC 15.2 (H) 02/25/2022 1401   WBC 3.6 (L) 09/21/2021 1133   RBC 2.88 (L) 02/25/2022 1401   HGB 10.0 (L) 02/25/2022 1401   HGB 10.8 (L) 01/07/2017 0758   HCT 29.9 (L) 02/25/2022 1401   HCT 31.9 (L) 01/07/2017 0758   PLT 233 02/25/2022 1401   PLT 126 (L) 01/07/2017 0758   MCV 103.8 (H) 02/25/2022 1401   MCV 90.8 01/07/2017 0758   MCH 34.7 (H) 02/25/2022 1401   MCHC 33.4 02/25/2022 1401   RDW 15.7 (H) 02/25/2022 1401   RDW 14.8 (H) 01/07/2017 0758   LYMPHSABS 0.9 02/25/2022 1401   LYMPHSABS 1.1 01/07/2017 0758   MONOABS 0.9 02/25/2022 1401   MONOABS 0.3 01/07/2017 0758   EOSABS 0.0 02/25/2022 1401   EOSABS 0.1 01/07/2017 0758   EOSABS 0.1 08/09/2016 1229   BASOSABS 0.0  02/25/2022 1401   BASOSABS 0.0 01/07/2017 0758    CMP     Component Value Date/Time   NA 135 02/25/2022 1401   NA 145 (H) 01/04/2020 0951   NA 140 01/07/2017 0758   K 4.9 02/25/2022 1401   K 4.1 01/07/2017 0758   CL 100 02/25/2022 1401   CO2 26 02/25/2022 1401   CO2 24 01/07/2017 0758   GLUCOSE 265 (H) 02/25/2022 1401   GLUCOSE 134 01/07/2017 0758   BUN 53 (H) 02/25/2022 1401   BUN 33 (H) 01/04/2020 4174  BUN 33.6 (H) 01/07/2017 0758   CREATININE 1.79 (H) 02/25/2022 1401   CREATININE 1.5 (H) 01/07/2017 0758   CALCIUM 9.0 02/25/2022 1401   CALCIUM 9.0 04/12/2020 0923   CALCIUM 9.3 01/07/2017 0758   PROT 6.2 (L) 02/25/2022 1401   PROT 6.3 01/04/2020 0951   PROT 6.7 01/07/2017 0758   ALBUMIN 3.4 (L) 02/25/2022 1401   ALBUMIN 4.3 01/04/2020 0951   ALBUMIN 3.6 01/07/2017 0758   AST 57 (H) 02/25/2022 1401   AST 51 (H) 01/07/2017 0758   ALT 18 02/25/2022 1401   ALT 82 (H) 01/07/2017 0758   ALKPHOS 182 (H) 02/25/2022 1401   ALKPHOS 115 01/07/2017 0758   BILITOT 0.5 02/25/2022 1401   BILITOT 0.72 01/07/2017 0758   GFRNONAA 30 (L) 02/25/2022 1401   GFRNONAA 55 (L) 02/18/2013 0827   GFRAA 36 (L) 01/04/2020 0951   GFRAA 38 (L) 04/28/2018 0915   GFRAA 63 02/18/2013 0827     ASSESSMENT and THERAPY PLAN:   Breast cancer of upper-outer quadrant of right female breast ([place]) Darlene [name] is a 73 year old woman with metastatic breast cancer here today for follow-up and evaluation prior to receiving her second cycle of her revealing chemotherapy.  Darlene [name] and I reviewed her labs today and she is okay to proceed with cycle 2 of her chemotherapy.  We reviewed that this cycle she will receive on day 1 and day 8 and receive Udenyca on day 9 of therapy.  I am concerned about this vomiting she is experiencing without feeling nauseated.  I have ordered an MRI of the brain to rule out recurrence.  Will return in 1 week for day 8 of her therapy and in 8 days for her injection.  We discussed her  tiredness and this is likely multifactorial.  She will continue to do small amounts of exercise and she will continue to work with physical therapy on her mobility due to her recent fall.   All questions were answered. The patient knows to call the clinic with any problems, questions or concerns. We can certainly see the patient much sooner if necessary.  Total encounter time:30 minutes*in face-to-face visit time, chart review, lab review, care coordination, order entry, and documentation of the encounter time.    Darlene Bihari, NP 02/25/22 3:46 PM Medical Oncology and Hematology [facility] [place], [place] [address] Tel. [phone]    Fax. [phone]  *Total Encounter Time as defined by the Centers for Medicare and Medicaid Services includes, in addition to the face-to-face time of a patient visit (documented in the note above) non-face-to-face time: obtaining and reviewing outside history, ordering and reviewing medications, tests or procedures, care coordination (communications with other health care professionals or caregivers) and documentation in the medical record.

## 2022-02-26 ENCOUNTER — Ambulatory Visit (INDEPENDENT_AMBULATORY_CARE_PROVIDER_SITE_OTHER): Payer: Medicare PPO | Admitting: Family Medicine

## 2022-02-26 ENCOUNTER — Encounter (INDEPENDENT_AMBULATORY_CARE_PROVIDER_SITE_OTHER): Payer: Self-pay | Admitting: Family Medicine

## 2022-02-26 VITALS — BP 114/70 | HR 90 | Temp 97.6°F | Ht 69.0 in

## 2022-02-26 DIAGNOSIS — F3289 Other specified depressive episodes: Secondary | ICD-10-CM | POA: Diagnosis not present

## 2022-02-26 DIAGNOSIS — E669 Obesity, unspecified: Secondary | ICD-10-CM | POA: Insufficient documentation

## 2022-02-26 DIAGNOSIS — M6284 Sarcopenia: Secondary | ICD-10-CM

## 2022-02-26 DIAGNOSIS — Z6825 Body mass index (BMI) 25.0-25.9, adult: Secondary | ICD-10-CM

## 2022-02-26 MED ORDER — VORTIOXETINE HBR 20 MG PO TABS: 20.0000 mg | ORAL_TABLET | Freq: Every day | ORAL | 0 refills | Status: DC

## 2022-02-26 MED ORDER — BUPROPION HCL ER (SR) 200 MG PO TB12: ORAL_TABLET | ORAL | 0 refills | Status: DC

## 2022-02-27 ENCOUNTER — Encounter: Payer: Self-pay | Admitting: Hematology and Oncology

## 2022-02-27 ENCOUNTER — Other Ambulatory Visit: Payer: Medicare PPO

## 2022-02-27 ENCOUNTER — Ambulatory Visit: Payer: Medicare PPO

## 2022-02-28 DIAGNOSIS — Z7409 Other reduced mobility: Secondary | ICD-10-CM | POA: Diagnosis not present

## 2022-02-28 DIAGNOSIS — R2681 Unsteadiness on feet: Secondary | ICD-10-CM | POA: Diagnosis not present

## 2022-02-28 DIAGNOSIS — R269 Unspecified abnormalities of gait and mobility: Secondary | ICD-10-CM | POA: Diagnosis not present

## 2022-03-04 ENCOUNTER — Inpatient Hospital Stay: Payer: Medicare PPO | Admitting: Pharmacist

## 2022-03-04 ENCOUNTER — Inpatient Hospital Stay: Payer: Medicare PPO

## 2022-03-04 ENCOUNTER — Ambulatory Visit: Payer: Medicare PPO | Admitting: Hematology and Oncology

## 2022-03-04 VITALS — BP 130/52 | HR 80 | Temp 98.6°F | Resp 16 | Wt 169.2 lb

## 2022-03-04 DIAGNOSIS — C787 Secondary malignant neoplasm of liver and intrahepatic bile duct: Secondary | ICD-10-CM

## 2022-03-04 DIAGNOSIS — C50411 Malignant neoplasm of upper-outer quadrant of right female breast: Secondary | ICD-10-CM | POA: Diagnosis not present

## 2022-03-04 DIAGNOSIS — Z95828 Presence of other vascular implants and grafts: Secondary | ICD-10-CM

## 2022-03-04 DIAGNOSIS — Z5189 Encounter for other specified aftercare: Secondary | ICD-10-CM | POA: Diagnosis not present

## 2022-03-04 DIAGNOSIS — Z171 Estrogen receptor negative status [ER-]: Secondary | ICD-10-CM | POA: Diagnosis not present

## 2022-03-04 DIAGNOSIS — Z5111 Encounter for antineoplastic chemotherapy: Secondary | ICD-10-CM | POA: Diagnosis not present

## 2022-03-04 LAB — CBC WITH DIFFERENTIAL (CANCER CENTER ONLY)
Abs Immature Granulocytes: 0 10*3/uL (ref 0.00–0.07)
Basophils Absolute: 0 10*3/uL (ref 0.0–0.1)
Basophils Relative: 0 %
Eosinophils Absolute: 0.1 10*3/uL (ref 0.0–0.5)
Eosinophils Relative: 2 %
HCT: 28.8 % — ABNORMAL LOW (ref 36.0–46.0)
Hemoglobin: 9.7 g/dL — ABNORMAL LOW (ref 12.0–15.0)
Lymphocytes Relative: 9 %
Lymphs Abs: 0.5 10*3/uL — ABNORMAL LOW (ref 0.7–4.0)
MCH: 34.6 pg — ABNORMAL HIGH (ref 26.0–34.0)
MCHC: 33.7 g/dL (ref 30.0–36.0)
MCV: 102.9 fL — ABNORMAL HIGH (ref 80.0–100.0)
Monocytes Absolute: 0.4 10*3/uL (ref 0.1–1.0)
Monocytes Relative: 8 %
Neutro Abs: 4.1 10*3/uL (ref 1.7–7.7)
Neutrophils Relative %: 81 %
Platelet Count: 282 10*3/uL (ref 150–400)
RBC: 2.8 MIL/uL — ABNORMAL LOW (ref 3.87–5.11)
RDW: 15 % (ref 11.5–15.5)
Smear Review: NORMAL
WBC Count: 5.1 10*3/uL (ref 4.0–10.5)
nRBC: 0 % (ref 0.0–0.2)

## 2022-03-04 LAB — CMP (CANCER CENTER ONLY)
ALT: 14 U/L (ref 0–44)
AST: 31 U/L (ref 15–41)
Albumin: 3.4 g/dL — ABNORMAL LOW (ref 3.5–5.0)
Alkaline Phosphatase: 149 U/L — ABNORMAL HIGH (ref 38–126)
Anion gap: 6 (ref 5–15)
BUN: 35 mg/dL — ABNORMAL HIGH (ref 8–23)
CO2: 25 mmol/L (ref 22–32)
Calcium: 9 mg/dL (ref 8.9–10.3)
Chloride: 105 mmol/L (ref 98–111)
Creatinine: 1.46 mg/dL — ABNORMAL HIGH (ref 0.44–1.00)
GFR, Estimated: 39 mL/min — ABNORMAL LOW (ref >60)
Glucose, Bld: 257 mg/dL — ABNORMAL HIGH (ref 70–99)
Potassium: 4.9 mmol/L (ref 3.5–5.1)
Sodium: 136 mmol/L (ref 135–145)
Total Bilirubin: 0.5 mg/dL (ref 0.3–1.2)
Total Protein: 6.5 g/dL (ref 6.5–8.1)

## 2022-03-04 LAB — MAGNESIUM: Magnesium: 1.8 mg/dL (ref 1.7–2.4)

## 2022-03-04 LAB — CEA (IN HOUSE-CHCC): CEA (CHCC-In House): 4581.71 ng/mL — ABNORMAL HIGH (ref 0.00–5.00)

## 2022-03-04 MED ORDER — SODIUM CHLORIDE 0.9 % IV SOLN
1.1000 mg/m2 | Freq: Once | INTRAVENOUS | Status: AC
  Administered 2022-03-04: 2 mg via INTRAVENOUS
  Filled 2022-03-04: qty 4

## 2022-03-04 MED ORDER — SODIUM CHLORIDE 0.9 % IV SOLN: Freq: Once | INTRAVENOUS | Status: AC

## 2022-03-04 MED ORDER — HEPARIN SOD (PORK) LOCK FLUSH 100 UNIT/ML IV SOLN
500.0000 [IU] | Freq: Once | INTRAVENOUS | Status: AC | PRN
  Administered 2022-03-04: 500 [IU]

## 2022-03-04 MED ORDER — SODIUM CHLORIDE 0.9% FLUSH
10.0000 mL | INTRAVENOUS | Status: DC | PRN
  Administered 2022-03-04: 10 mL

## 2022-03-04 MED ORDER — ONDANSETRON HCL 4 MG/2ML IJ SOLN
8.0000 mg | Freq: Once | INTRAMUSCULAR | Status: AC
  Administered 2022-03-04: 8 mg via INTRAVENOUS
  Filled 2022-03-04: qty 4

## 2022-03-04 MED ORDER — SODIUM CHLORIDE 0.9% FLUSH
10.0000 mL | Freq: Once | INTRAVENOUS | Status: AC
  Administered 2022-03-04: 10 mL

## 2022-03-04 NOTE — Patient Instructions (Addendum)
[facility]  Discharge Instructions: Thank you for choosing [facility] to provide your oncology and hematology care.   If you have a lab appointment with the [facility], please go directly to the [facility] and check in at the registration area.   Wear comfortable clothing and clothing appropriate for easy access to any Portacath or PICC line.   We strive to give you quality time with your provider. You may need to reschedule your appointment if you arrive late (15 or more minutes).  Arriving late affects you and other patients whose appointments are after yours.  Also, if you miss three or more appointments without notifying the office, you may be dismissed from the clinic at the provider's discretion.      For prescription refill requests, have your pharmacy contact our office and allow 72 hours for refills to be completed.    Today you received the following chemotherapy and/or immunotherapy agents: [place].       To help prevent nausea and vomiting after your treatment, we encourage you to take your nausea medication as directed.  BELOW ARE SYMPTOMS THAT SHOULD BE REPORTED IMMEDIATELY: *FEVER GREATER THAN 100.4 F (38 C) OR HIGHER *CHILLS OR SWEATING *NAUSEA AND VOMITING THAT IS NOT CONTROLLED WITH YOUR NAUSEA MEDICATION *UNUSUAL SHORTNESS OF BREATH *UNUSUAL BRUISING OR BLEEDING *URINARY PROBLEMS (pain or burning when urinating, or frequent urination) *BOWEL PROBLEMS (unusual diarrhea, constipation, pain near the anus) TENDERNESS IN MOUTH AND THROAT WITH OR WITHOUT PRESENCE OF ULCERS (sore throat, sores in mouth, or a toothache) UNUSUAL RASH, SWELLING OR PAIN  UNUSUAL VAGINAL DISCHARGE OR ITCHING   Items with * indicate a potential emergency and should be followed up as soon as possible or go to the Emergency Department if any problems should occur.  Please show the CHEMOTHERAPY ALERT CARD or IMMUNOTHERAPY ALERT CARD at  check-in to the Emergency Department and triage nurse.  Should you have questions after your visit or need to cancel or reschedule your appointment, please contact [facility]  Dept: [phone]  and follow the prompts.  Office hours are 8:00 a.m. to 4:30 p.m. Monday - Friday. Please note that voicemails left after 4:00 p.m. may not be returned until the following business day.  We are closed weekends and major holidays. You have access to a nurse at all times for urgent questions. Please call the main number to the clinic Dept: [phone] and follow the prompts.   For any non-urgent questions, you may also contact your provider using MyChart. We now offer e-Visits for anyone 28 and older to request care online for non-urgent symptoms. For details visit [email or web address].   Also download the MyChart app! Go to the app store, search "MyChart", open the app, select [facility], and log in with your MyChart username and password. [email or web address]

## 2022-03-05 ENCOUNTER — Inpatient Hospital Stay: Payer: Medicare PPO

## 2022-03-05 VITALS — BP 127/66 | HR 91 | Temp 98.5°F | Resp 16

## 2022-03-05 DIAGNOSIS — C50411 Malignant neoplasm of upper-outer quadrant of right female breast: Secondary | ICD-10-CM

## 2022-03-05 DIAGNOSIS — C787 Secondary malignant neoplasm of liver and intrahepatic bile duct: Secondary | ICD-10-CM | POA: Diagnosis not present

## 2022-03-05 DIAGNOSIS — Z5111 Encounter for antineoplastic chemotherapy: Secondary | ICD-10-CM | POA: Diagnosis not present

## 2022-03-05 DIAGNOSIS — Z5189 Encounter for other specified aftercare: Secondary | ICD-10-CM | POA: Diagnosis not present

## 2022-03-05 DIAGNOSIS — Z171 Estrogen receptor negative status [ER-]: Secondary | ICD-10-CM | POA: Diagnosis not present

## 2022-03-05 MED ORDER — PEGFILGRASTIM-CBQV 6 MG/0.6ML ~~LOC~~ SOSY
6.0000 mg | PREFILLED_SYRINGE | Freq: Once | SUBCUTANEOUS | Status: AC
  Administered 2022-03-05: 6 mg via SUBCUTANEOUS
  Filled 2022-03-05: qty 0.6

## 2022-03-07 ENCOUNTER — Ambulatory Visit (HOSPITAL_COMMUNITY)
Admission: RE | Admit: 2022-03-07 | Discharge: 2022-03-07 | Disposition: A | Discharge status: Home / Self Care | Payer: Medicare PPO | Source: Ambulatory Visit | Attending: Adult Health | Admitting: Adult Health

## 2022-03-07 DIAGNOSIS — C787 Secondary malignant neoplasm of liver and intrahepatic bile duct: Secondary | ICD-10-CM | POA: Insufficient documentation

## 2022-03-07 DIAGNOSIS — C50411 Malignant neoplasm of upper-outer quadrant of right female breast: Secondary | ICD-10-CM | POA: Insufficient documentation

## 2022-03-07 DIAGNOSIS — R2681 Unsteadiness on feet: Secondary | ICD-10-CM | POA: Diagnosis not present

## 2022-03-07 DIAGNOSIS — G319 Degenerative disease of nervous system, unspecified: Secondary | ICD-10-CM | POA: Diagnosis not present

## 2022-03-07 DIAGNOSIS — Z7409 Other reduced mobility: Secondary | ICD-10-CM | POA: Diagnosis not present

## 2022-03-07 DIAGNOSIS — R269 Unspecified abnormalities of gait and mobility: Secondary | ICD-10-CM | POA: Diagnosis not present

## 2022-03-07 DIAGNOSIS — R1112 Projectile vomiting: Secondary | ICD-10-CM | POA: Insufficient documentation

## 2022-03-07 MED ORDER — GADOBUTROL 1 MMOL/ML IV SOLN
7.0000 mL | Freq: Once | INTRAVENOUS | Status: AC | PRN
  Administered 2022-03-07: 7 mL via INTRAVENOUS

## 2022-03-08 ENCOUNTER — Telehealth: Payer: Self-pay

## 2022-03-08 NOTE — Telephone Encounter (Signed)
-----   Message from Gardenia Phlegm, NP sent at 03/08/2022 11:37 AM EST ----- Please call patient with the good news.  MRI is negative for cancer  ----- Message ----- From: Interface, Rad Results In Sent: 03/08/2022  10:37 AM EST To: Gardenia Phlegm, NP

## 2022-03-08 NOTE — Telephone Encounter (Signed)
Called and given below message. Pt verbalized understanding and was appreciative of call.

## 2022-03-11 DIAGNOSIS — R269 Unspecified abnormalities of gait and mobility: Secondary | ICD-10-CM | POA: Diagnosis not present

## 2022-03-11 DIAGNOSIS — R2681 Unsteadiness on feet: Secondary | ICD-10-CM | POA: Diagnosis not present

## 2022-03-11 DIAGNOSIS — Z7409 Other reduced mobility: Secondary | ICD-10-CM | POA: Diagnosis not present

## 2022-03-12 NOTE — Progress Notes (Unsigned)
Chief Complaint:   OBESITY Chariah is here to discuss her progress with her obesity treatment plan along with follow-up of her obesity related diagnoses. Naidelyn is on keeping a food journal and adhering to recommended goals of 1800 calories and 100+ grams of protein and states she is following her eating plan approximately 80% of the time. Sharain states she is doing 0 minutes 0 times per week.  Today's visit was #: 10 Starting weight: 225 lbs Starting date: 08/09/2016 Today's weight: 169 lbs Today's date: 02/26/2022 Total lbs lost to date: 56 Total lbs lost since last in-office visit: 4  Interim History: Asena is dealing with metastatic breast cancer. She is doing chemo but feeling fatigue and is limited in her exercise. She just started back to physical therapy as she has lost some muscle mas and has had 2 recent falls. She is not always able to meet her protein goals done to decrease her appetite.   Subjective:   1. Sarcopenia [name] has lost some muscle mass due to decreased activity and protein as well as chemotherapy. She has felt weaker lately.   2. Emotional Eating Behavior Velita is dealing with a lot of stress with her health. She sometimes feels discouraged that her cancer is not improving how she would like. She states that she still has a reasonably good prognosis but she is tired of fighting at times.   Assessment/Plan:   1. Sarcopenia Corrin is to continue to work on increasing protein and strengthening exercise per physical therapy, while being especially careful to avoid falls.   2. Emotional Eating Behavior We will refill Wellbutrin SR and Trintellix for 90 days. Naidely was offered support and encouragement. She will concentrate on the things she cn control and will go from there.   - buPROPion (WELLBUTRIN SR) 200 MG 12 hr tablet; Take one tablet by mouth once daily.  Dispense: 90 tablet; Refill: 0 - vortioxetine HBr (TRINTELLIX) 20 MG TABS  tablet; Take 1 tablet (20 mg total) by mouth at bedtime.  Dispense: 90 tablet; Refill: 0  3. BMI 25.0-25.9,adult  4. Obesity, Beginning BMI 33.23 Alysse is currently in the action stage of change. As such, her goal is to continue with weight loss efforts. She has agreed to keeping a food journal and adhering to recommended goals of 1800 calories and 100+ grams of protein daily.   Patient is ok to use protein drinks or shakes to help her reach her nutrition goals.   Behavioral modification strategies: increasing lean protein intake.  Babby has agreed to follow-up with our clinic in 8 weeks. She was informed of the importance of frequent follow-up visits to maximize her success with intensive lifestyle modifications for her multiple health conditions.   Objective:   Blood pressure 114/70, pulse 90, temperature 97.6 F (36.4 C), height 5' 9"$  (1.753 m), last menstrual period 10/22/2007, SpO2 97 %. Body mass index is 26.23 kg/m.  General: Cooperative, alert, well developed, in no acute distress. HEENT: Conjunctivae and lids unremarkable. Cardiovascular: Regular rhythm.  Lungs: Normal work of breathing. Neurologic: No focal deficits.   Lab Results  Component Value Date   CREATININE 1.46 (H) 03/04/2022   BUN 35 (H) 03/04/2022   NA 136 03/04/2022   K 4.9 03/04/2022   CL 105 03/04/2022   CO2 25 03/04/2022   Lab Results  Component Value Date   ALT 14 03/04/2022   AST 31 03/04/2022   ALKPHOS 149 (H) 03/04/2022   BILITOT 0.5 03/04/2022  Lab Results  Component Value Date   HGBA1C 7.4 (H) 12/19/2021   HGBA1C 5.9 (A) 05/23/2021   HGBA1C 6.3 (A) 11/23/2020   HGBA1C 5.3 07/04/2020   HGBA1C 5.2 01/04/2020   Lab Results  Component Value Date   INSULIN 7.6 01/25/2019   INSULIN 15.3 03/11/2017   INSULIN 18.3 08/09/2016   Lab Results  Component Value Date   TSH 1.45 04/11/2021   Lab Results  Component Value Date   CHOL 114 12/24/2021   HDL 54 12/24/2021   LDLCALC 38  12/24/2021   TRIG 111 12/24/2021   CHOLHDL 2.1 12/24/2021   Lab Results  Component Value Date   VD25OH 63.18 12/19/2021   VD25OH 49.3 01/04/2020   VD25OH 43.6 10/05/2019   Lab Results  Component Value Date   WBC 5.1 03/04/2022   HGB 9.7 (L) 03/04/2022   HCT 28.8 (L) 03/04/2022   MCV 102.9 (H) 03/04/2022   PLT 282 03/04/2022   Lab Results  Component Value Date   IRON 58 02/18/2013   TIBC 305 02/18/2013   FERRITIN 133 10/31/2014   Attestation Statements:   Reviewed by clinician on day of visit: allergies, medications, problem list, medical history, surgical history, family history, social history, and previous encounter notes.  Time spent on visit including pre-visit chart review and post-visit care and charting was 45 minutes.   I, [name], am acting as transcriptionist for Dennard Nip, MD.  I have reviewed the above documentation for accuracy and completeness, and I agree with the above. -  ***

## 2022-03-13 ENCOUNTER — Ambulatory Visit: Payer: Medicare PPO | Admitting: Hematology and Oncology

## 2022-03-13 ENCOUNTER — Ambulatory Visit: Payer: Medicare PPO

## 2022-03-13 ENCOUNTER — Other Ambulatory Visit: Payer: Medicare PPO

## 2022-03-13 NOTE — Progress Notes (Signed)
Patient Care Team: Isaac Bliss, Rayford Halsted, MD as PCP - General (Internal Medicine) Larey Dresser, MD as PCP - Advanced Heart Failure (Cardiology) Stark Klein, MD as Consulting Physician (General Surgery) Jacelyn Pi, MD as Consulting Physician (Endocrinology) [name], MD as Consulting Physician (Plastic Surgery) Gery Pray, MD as Consulting Physician (Radiation Oncology) Juanita Craver, MD as Consulting Physician (Gastroenterology) Alda Berthold, DO as Consulting Physician (Neurology) Starlyn Skeans, MD as Consulting Physician (Family Medicine) Sherlynn Stalls, MD as Consulting Physician (Ophthalmology) Salvadore Dom, MD as Consulting Physician (Obstetrics and Gynecology) Raina Mina, RPH-CPP (Pharmacist) Nicholas Lose, MD as Consulting Physician (Hematology and Oncology)  DIAGNOSIS: No diagnosis found.  SUMMARY OF ONCOLOGIC HISTORY: Oncology History  Breast cancer of upper-outer quadrant of right female breast ([place])  07/19/2014 Initial Biopsy   (R) breast needle biopsy (10:00): IDC, DCIS, grade 2. ER-, PR-, HER2+ (ratio 2.42). Ki67 90%.    07/22/2014 Initial Diagnosis   Breast cancer of upper-outer quadrant of right female breast   08/08/2014 - 11/21/2014 Neo-Adjuvant Chemotherapy   Taxotere/Carbo/Herceptin/Perjeta x 6 cycles.    12/12/2014 - 10/02/2015 Chemotherapy   Maintenance Herceptin (to complete 1 year of therapy).    12/27/2014 Surgery   (R) lumpectomy with SL[facility]): IDC, grade 2, spanning 4 cm, associated high grade DCIS. Negative margins. 1/3 SLN (+).   ER/PR repeated and remain negative.   ypT2, ypN1a: Stage IIB   01/06/2015 Surgery   Bilateral breast mammoplasty ([place]): Right breast path with intralymphatic emboli of ductal carcinoma. Left breast benign.    02/02/2015 Imaging   CT chest: No findings of metastatic disease in the chest. Right axillary fluid density lesion likely postoperative seroma or hematoma. Small  left subpectoral nodes warrant followup attention.   02/02/2015 Imaging   Bone scan: No scintigraphic evidence of osseous metastatic disease   02/16/2015 Surgery   (R) mastectomy and ALND [facility]): Scattered foci of high grade DCIS, scattered intralymphatic tumor emboli. Margins negative. 0/15 LNs.    04/05/2015 - 05/19/2015 Radiation Therapy   Adjuvant XRT ([place]). The right chest wall was treated to 50.4 Gy in 28 fractions at 1.8 Gy per fraction.  The right mastectomy scar was treated to 10 Gy in 5 fractions at 2 Gy per fraction.    12/06/2015 PET scan   Right subpectoral lymph node measuring 9 mm and exhibits malignant range FDG uptake, suspicious for metastatic adenopathy. Subcutaneous nodule within the upper left chest wall exhibits malignant range uptake, which is indeterminate; this is at the site of previous Port-A-Cath and is favored to represent postsurgical change. Indeterminate left adrenal nodules which exhibit mild uptake, favored to represent benign adenoma.   08/11/2017 - 11/03/2017 Chemotherapy   The patient had ado-trastuzumab emtansine (KADCYLA) 320 mg in sodium chloride 0.9 % 250 mL chemo infusion, 3.6 mg/kg = 320 mg, Intravenous, Once, 4 of 5 cycles Administration: 320 mg (08/12/2017), 320 mg (09/01/2017), 320 mg (09/23/2017)  for chemotherapy treatment.    11/18/2017 - 08/30/2020 Chemotherapy   The patient had trastuzumab (HERCEPTIN) 546 mg in sodium chloride 0.9 % 250 mL chemo infusion, 6 mg/kg = 546 mg (100 % of original dose 6 mg/kg), Intravenous,  Once, 2 of 2 cycles Dose modification: 6 mg/kg (original dose 6 mg/kg, Cycle 1, Reason: Provider Judgment) Administration: 546 mg (11/18/2017), 546 mg (12/16/2017) pertuzumab (PERJETA) 420 mg in sodium chloride 0.9 % 250 mL chemo infusion, 420 mg (100 % of original dose 420 mg), Intravenous, Once, 15 of 15 cycles Dose  modification: 420 mg (original dose 420 mg, Cycle 18), 420 mg (original dose 420 mg, Cycle 27) Administration: 420  mg (10/06/2018), 420 mg (10/27/2018), 420 mg (11/18/2018), 420 mg (12/08/2018), 420 mg (12/29/2018), 420 mg (01/19/2019), 420 mg (02/16/2019), 420 mg (03/16/2019), 420 mg (04/13/2019), 420 mg (06/08/2019), 420 mg (07/06/2019), 420 mg (08/03/2019), 420 mg (08/31/2019), 420 mg (09/28/2019), 420 mg (05/19/2019) trastuzumab-anns (KANJINTI) 546 mg in sodium chloride 0.9 % 250 mL chemo infusion, 6 mg/kg = 546 mg (100 % of original dose 6 mg/kg), Intravenous,  Once, 30 of 30 cycles Dose modification: 6 mg/kg (original dose 6 mg/kg, Cycle 3, Reason: Other (see comments), Comment: requested step therapy by [facility] insurance), 4 mg/kg (original dose 6 mg/kg, Cycle 9, Reason: Provider Judgment), 6 mg/kg (original dose 6 mg/kg, Cycle 17, Reason: Provider Judgment), 4 mg/kg (original dose 6 mg/kg, Cycle 27, Reason: Provider Judgment), 6 mg/kg (original dose 6 mg/kg, Cycle 27, Reason: Provider Judgment) Administration: 546 mg (01/13/2018), 546 mg (02/10/2018), 546 mg (03/03/2018), 546 mg (03/24/2018), 546 mg (04/14/2018), 546 mg (05/05/2018), 357 mg (05/26/2018), 357 mg (06/09/2018), 357 mg (06/23/2018), 357 mg (07/07/2018), 357 mg (07/21/2018), 357 mg (08/04/2018), 357 mg (08/18/2018), 357 mg (09/01/2018), 546 mg (09/15/2018), 546 mg (10/06/2018), 546 mg (10/27/2018), 546 mg (11/18/2018), 546 mg (12/08/2018), 546 mg (12/29/2018), 546 mg (01/19/2019), 546 mg (02/16/2019), 480 mg (03/16/2019), 483 mg (04/13/2019), 450 mg (06/08/2019), 450 mg (07/06/2019), 450 mg (08/03/2019), 450 mg (08/31/2019), 450 mg (09/28/2019)  for chemotherapy treatment.    12/31/2017 - 09/01/2018 Chemotherapy   The patient had PACLitaxel-protein bound (ABRAXANE) chemo infusion 175 mg, 80 mg/m2 = 175 mg (100 % of original dose 80 mg/m2), Intravenous,  Once, 5 of 10 cycles Dose modification: 80 mg/m2 (original dose 80 mg/m2, Cycle 11) Administration: 175 mg (07/07/2018), 175 mg (07/21/2018), 175 mg (08/04/2018), 175 mg (08/18/2018), 175 mg (09/01/2018) PACLitaxel-protein bound (ABRAXANE) chemo  infusion 200 mg, 100 mg/m2 = 200 mg, Intravenous, Once, 10 of 10 cycles Dose modification: 80 mg/m2 (original dose 100 mg/m2, Cycle 2, Reason: Provider Judgment), 80 mg/m2 (original dose 100 mg/m2, Cycle 2, Reason: Provider Judgment) Administration: 200 mg (12/31/2017), 200 mg (01/06/2018), 175 mg (01/20/2018), 175 mg (01/27/2018), 175 mg (02/10/2018), 175 mg (03/03/2018), 175 mg (03/10/2018), 175 mg (03/24/2018), 175 mg (04/07/2018), 175 mg (04/14/2018), 175 mg (04/28/2018), 175 mg (05/05/2018), 175 mg (05/26/2018), 175 mg (06/09/2018), 175 mg (05/19/2018)  for chemotherapy treatment.    10/05/2020 - 01/18/2021 Chemotherapy   Patient is on Treatment Plan : BREAST METASTATIC fam-trastuzumab deruxtecan-nxki (Enhertu) q21d     02/19/2021 - 05/14/2021 Chemotherapy   Patient is on Treatment Plan : BREAST Trastuzumab q21d x 13 cycles     06/20/2021 - 08/01/2021 Chemotherapy   Patient is on Treatment Plan : BREAST METASTATIC fam-trastuzumab deruxtecan-nxki (Enhertu) q21d     07/25/2021 - 10/19/2021 Chemotherapy   Patient is on Treatment Plan : BREAST METASTATIC Sacituzumab govitecan-hziy Ivette Loyal) D1,8 q21d     08/31/2021 - 09/21/2021 Chemotherapy   Patient is on Treatment Plan : BREAST METASTATIC Sacituzumab govitecan-hziy Ivette Loyal) q21d     01/30/2022 -  Chemotherapy   Patient is on Treatment Plan : BREAST METASTATIC Eribulin D1,8 q21d     Metastasis to liver ([place])  08/04/2017 Initial Diagnosis   Metastasis to liver ([place])   08/12/2017 - 10/14/2017 Chemotherapy   The patient had ado-trastuzumab emtansine (KADCYLA) 320 mg in sodium chloride 0.9 % 250 mL chemo infusion, 3.6 mg/kg = 320 mg, Intravenous, Once, 4 of 5 cycles Administration: 320 mg (08/12/2017), 320  mg (09/01/2017), 320 mg (09/23/2017)  for chemotherapy treatment.    11/18/2017 - 08/30/2020 Chemotherapy   The patient had trastuzumab (HERCEPTIN) 546 mg in sodium chloride 0.9 % 250 mL chemo infusion, 6 mg/kg = 546 mg (100 % of original dose 6 mg/kg), Intravenous,   Once, 2 of 2 cycles Dose modification: 6 mg/kg (original dose 6 mg/kg, Cycle 1, Reason: Provider Judgment) Administration: 546 mg (11/18/2017), 546 mg (12/16/2017) pertuzumab (PERJETA) 420 mg in sodium chloride 0.9 % 250 mL chemo infusion, 420 mg (100 % of original dose 420 mg), Intravenous, Once, 15 of 15 cycles Dose modification: 420 mg (original dose 420 mg, Cycle 18), 420 mg (original dose 420 mg, Cycle 27) Administration: 420 mg (10/06/2018), 420 mg (10/27/2018), 420 mg (11/18/2018), 420 mg (12/08/2018), 420 mg (12/29/2018), 420 mg (01/19/2019), 420 mg (02/16/2019), 420 mg (03/16/2019), 420 mg (04/13/2019), 420 mg (06/08/2019), 420 mg (07/06/2019), 420 mg (08/03/2019), 420 mg (08/31/2019), 420 mg (09/28/2019), 420 mg (05/19/2019) trastuzumab-anns (KANJINTI) 546 mg in sodium chloride 0.9 % 250 mL chemo infusion, 6 mg/kg = 546 mg (100 % of original dose 6 mg/kg), Intravenous,  Once, 30 of 30 cycles Dose modification: 6 mg/kg (original dose 6 mg/kg, Cycle 3, Reason: Other (see comments), Comment: requested step therapy by [facility] insurance), 4 mg/kg (original dose 6 mg/kg, Cycle 9, Reason: Provider Judgment), 6 mg/kg (original dose 6 mg/kg, Cycle 17, Reason: Provider Judgment), 4 mg/kg (original dose 6 mg/kg, Cycle 27, Reason: Provider Judgment), 6 mg/kg (original dose 6 mg/kg, Cycle 27, Reason: Provider Judgment) Administration: 546 mg (01/13/2018), 546 mg (02/10/2018), 546 mg (03/03/2018), 546 mg (03/24/2018), 546 mg (04/14/2018), 546 mg (05/05/2018), 357 mg (05/26/2018), 357 mg (06/09/2018), 357 mg (06/23/2018), 357 mg (07/07/2018), 357 mg (07/21/2018), 357 mg (08/04/2018), 357 mg (08/18/2018), 357 mg (09/01/2018), 546 mg (09/15/2018), 546 mg (10/06/2018), 546 mg (10/27/2018), 546 mg (11/18/2018), 546 mg (12/08/2018), 546 mg (12/29/2018), 546 mg (01/19/2019), 546 mg (02/16/2019), 480 mg (03/16/2019), 483 mg (04/13/2019), 450 mg (06/08/2019), 450 mg (07/06/2019), 450 mg (08/03/2019), 450 mg (08/31/2019), 450 mg (09/28/2019)  for chemotherapy  treatment.    01/30/2022 -  Chemotherapy   Patient is on Treatment Plan : BREAST METASTATIC Eribulin D1,8 q21d       CHIEF COMPLIANT: Follow-up on Eribulin    INTERVAL HISTORY: Engie Perlstein is a 72 Year-old- with the above mentioned right breast cancer. Currently on Xeloda. She presents to the clinic for a follow-up and treatment.      ALLERGIES:  is allergic to antihistamines, diphenhydramine-type; nsaids; and tape.  MEDICATIONS:  Current Outpatient Medications  Medication Sig Dispense Refill   Accu-Chek Softclix Lancets lancets 1 each by Other route 4 (four) times daily. Test blood sugars 4 x times daily 200 each 1   aspirin EC 81 MG tablet Take 1 tablet (81 mg total) by mouth daily. 90 tablet 3   Bempedoic Acid-Ezetimibe (NEXLIZET) 180-10 MG TABS Take 1 tablet by mouth daily. 90 tablet 3   Blood Glucose Calibration (ACCU-CHEK GUIDE CONTROL) LIQD 1 Bottle by In Vitro route as needed. 1 each 0   Blood Glucose Monitoring Suppl (ACCU-CHEK GUIDE) w/Device KIT 1 each by Does not apply route 4 (four) times daily. Use to test blood sugars 4x a day 1 kit 0   buPROPion (WELLBUTRIN SR) 200 MG 12 hr tablet Take one tablet by mouth once daily. 90 tablet 0   CALCIUM CITRATE-VITAMIN D PO Take 1 tablet by mouth 3 (three) times daily.      carvedilol (COREG)  6.25 MG tablet Take 1 tablet (6.25 mg total) by mouth 2 (two) times daily. 180 tablet 3   cholecalciferol (VITAMIN D) 1000 units tablet Take 1,000 Units by mouth daily.     Cyanocobalamin (VITAMIN B-12 PO) Take 1 tablet by mouth every 14 (fourteen) days.     glucose blood (ACCU-CHEK GUIDE) test strip Use Accu Chek test strips to check blood sugar 4 times daily. DX:E11.21 400 each 3   insulin aspart (NOVOLOG) 100 UNIT/ML injection Inject into the skin daily as needed for high blood sugar. Per patient, sliding scale with meals as needed     insulin glargine (LANTUS SOLOSTAR) 100 UNIT/ML Solostar Pen Inject 8 Units into the skin daily. 7 mL  3   KERENDIA 20 MG TABS TAKE 1 TABLET EVERY DAY 90 tablet 3   Lactobacillus ([employer] ACIDOPHILUS PROBIOTIC) 0.5 MG TABS Take 1 tablet by mouth daily.     loperamide (IMODIUM) 2 MG capsule Take 2 mg by mouth as needed for diarrhea or loose stools.     melatonin 5 MG TABS Take 5 mg by mouth at bedtime.     Multiple Vitamins-Minerals (OPURITY) TABS Take by mouth.     ondansetron (ZOFRAN) 8 MG tablet Take 1 tablet (8 mg total) by mouth every 8 (eight) hours as needed for nausea or vomiting. 30 tablet 2   polyethylene glycol (MIRALAX / GLYCOLAX) 17 g packet Take 17 g by mouth daily.     prochlorperazine (COMPAZINE) 10 MG tablet Take 1 tablet (10 mg total) by mouth every 6 (six) hours as needed (Nausea or vomiting). 30 tablet 6   Propylene Glycol (SYSTANE BALANCE OP) Place 1-2 drops into both eyes 3 (three) times daily as needed (for dry eyes).      SURE COMFORT PEN NEEDLES 32G X 4 MM MISC Use as instructed to inject insulin and Victoza daily. 200 each 0   tobramycin (TOBREX) 0.3 % ophthalmic ointment Place 1 application into both eyes as needed (Takes day before eye injections).     vortioxetine HBr (TRINTELLIX) 20 MG TABS tablet Take 1 tablet (20 mg total) by mouth at bedtime. 90 tablet 0   No current facility-administered medications for this visit.   Facility-Administered Medications Ordered in Other Visits  Medication Dose Route Frequency Provider Last Rate Last Admin   acetaminophen (TYLENOL) 325 MG tablet            diphenhydrAMINE (BENADRYL) 50 MG/ML injection            famotidine (PEPCID) 20-0.9 MG/50ML-% IVPB            palonosetron (ALOXI) 0.25 MG/5ML injection             PHYSICAL EXAMINATION: ECOG PERFORMANCE STATUS: {CHL ONC ECOG PS:[phone]}  There were no vitals filed for this visit. There were no vitals filed for this visit.  BREAST:*** No palpable masses or nodules in either right or left breasts. No palpable axillary supraclavicular or infraclavicular adenopathy no breast  tenderness or nipple discharge. (exam performed in the presence of a chaperone)  LABORATORY DATA:  I have reviewed the data as listed    Latest Ref Rng & Units 03/04/2022    8:44 AM 02/25/2022    2:01 PM 02/11/2022    9:44 AM  CMP  Glucose 70 - 99 mg/dL 257  265  190   BUN 8 - 23 mg/dL 35  53  32   Creatinine 0.44 - 1.00 mg/dL 1.46  1.79  1.70  Sodium 135 - 145 mmol/L 136  135  137   Potassium 3.5 - 5.1 mmol/L 4.9  4.9  4.4   Chloride 98 - 111 mmol/L 105  100  104   CO2 22 - 32 mmol/L 25  26  27   $ Calcium 8.9 - 10.3 mg/dL 9.0  9.0  8.6   Total Protein 6.5 - 8.1 g/dL 6.5  6.2  6.0   Total Bilirubin 0.3 - 1.2 mg/dL 0.5  0.5  0.5   Alkaline Phos 38 - 126 U/L 149  182  78   AST 15 - 41 U/L 31  57  30   ALT 0 - 44 U/L 14  18  11     $ Lab Results  Component Value Date   WBC 5.1 03/04/2022   HGB 9.7 (L) 03/04/2022   HCT 28.8 (L) 03/04/2022   MCV 102.9 (H) 03/04/2022   PLT 282 03/04/2022   NEUTROABS 4.1 03/04/2022    ASSESSMENT & PLAN:  No problem-specific Assessment & Plan notes found for this encounter.    No orders of the defined types were placed in this encounter.  The patient has a good understanding of the overall plan. she agrees with it. she will call with any problems that may develop before the next visit here. Total time spent: 30 mins including face to face time and time spent for planning, charting and co-ordination of care   Suzzette Righter, [place] 03/13/22    I Gardiner Coins am acting as a Education administrator for [employer]  ***

## 2022-03-14 DIAGNOSIS — Z7409 Other reduced mobility: Secondary | ICD-10-CM | POA: Diagnosis not present

## 2022-03-14 DIAGNOSIS — R2681 Unsteadiness on feet: Secondary | ICD-10-CM | POA: Diagnosis not present

## 2022-03-14 DIAGNOSIS — R269 Unspecified abnormalities of gait and mobility: Secondary | ICD-10-CM | POA: Diagnosis not present

## 2022-03-18 ENCOUNTER — Inpatient Hospital Stay: Payer: Medicare PPO

## 2022-03-18 ENCOUNTER — Inpatient Hospital Stay (HOSPITAL_BASED_OUTPATIENT_CLINIC_OR_DEPARTMENT_OTHER): Payer: Medicare PPO | Admitting: Hematology and Oncology

## 2022-03-18 VITALS — BP 138/62 | HR 55 | Temp 98.0°F | Resp 16

## 2022-03-18 VITALS — BP 123/51 | HR 75 | Temp 98.1°F | Resp 16 | Wt 175.4 lb

## 2022-03-18 DIAGNOSIS — C787 Secondary malignant neoplasm of liver and intrahepatic bile duct: Secondary | ICD-10-CM

## 2022-03-18 DIAGNOSIS — Z171 Estrogen receptor negative status [ER-]: Secondary | ICD-10-CM

## 2022-03-18 DIAGNOSIS — C50411 Malignant neoplasm of upper-outer quadrant of right female breast: Secondary | ICD-10-CM | POA: Diagnosis not present

## 2022-03-18 DIAGNOSIS — Z5111 Encounter for antineoplastic chemotherapy: Secondary | ICD-10-CM | POA: Diagnosis not present

## 2022-03-18 DIAGNOSIS — Z5189 Encounter for other specified aftercare: Secondary | ICD-10-CM | POA: Diagnosis not present

## 2022-03-18 DIAGNOSIS — Z95828 Presence of other vascular implants and grafts: Secondary | ICD-10-CM

## 2022-03-18 LAB — CBC WITH DIFFERENTIAL (CANCER CENTER ONLY)
Abs Immature Granulocytes: 0.15 10*3/uL — ABNORMAL HIGH (ref 0.00–0.07)
Basophils Absolute: 0.1 10*3/uL (ref 0.0–0.1)
Basophils Relative: 0 %
Eosinophils Absolute: 0 10*3/uL (ref 0.0–0.5)
Eosinophils Relative: 0 %
HCT: 30.3 % — ABNORMAL LOW (ref 36.0–46.0)
Hemoglobin: 10.1 g/dL — ABNORMAL LOW (ref 12.0–15.0)
Immature Granulocytes: 1 %
Lymphocytes Relative: 3 %
Lymphs Abs: 0.6 10*3/uL — ABNORMAL LOW (ref 0.7–4.0)
MCH: 34 pg (ref 26.0–34.0)
MCHC: 33.3 g/dL (ref 30.0–36.0)
MCV: 102 fL — ABNORMAL HIGH (ref 80.0–100.0)
Monocytes Absolute: 0.6 10*3/uL (ref 0.1–1.0)
Monocytes Relative: 3 %
Neutro Abs: 16.6 10*3/uL — ABNORMAL HIGH (ref 1.7–7.7)
Neutrophils Relative %: 93 %
Platelet Count: 164 10*3/uL (ref 150–400)
RBC: 2.97 MIL/uL — ABNORMAL LOW (ref 3.87–5.11)
RDW: 15.9 % — ABNORMAL HIGH (ref 11.5–15.5)
WBC Count: 18 10*3/uL — ABNORMAL HIGH (ref 4.0–10.5)
nRBC: 0 % (ref 0.0–0.2)

## 2022-03-18 LAB — CMP (CANCER CENTER ONLY)
ALT: 22 U/L (ref 0–44)
AST: 71 U/L — ABNORMAL HIGH (ref 15–41)
Albumin: 3.4 g/dL — ABNORMAL LOW (ref 3.5–5.0)
Alkaline Phosphatase: 206 U/L — ABNORMAL HIGH (ref 38–126)
Anion gap: 7 (ref 5–15)
BUN: 35 mg/dL — ABNORMAL HIGH (ref 8–23)
CO2: 27 mmol/L (ref 22–32)
Calcium: 8.5 mg/dL — ABNORMAL LOW (ref 8.9–10.3)
Chloride: 104 mmol/L (ref 98–111)
Creatinine: 1.5 mg/dL — ABNORMAL HIGH (ref 0.44–1.00)
GFR, Estimated: 37 mL/min — ABNORMAL LOW (ref >60)
Glucose, Bld: 196 mg/dL — ABNORMAL HIGH (ref 70–99)
Potassium: 4.6 mmol/L (ref 3.5–5.1)
Sodium: 138 mmol/L (ref 135–145)
Total Bilirubin: 0.5 mg/dL (ref 0.3–1.2)
Total Protein: 6.5 g/dL (ref 6.5–8.1)

## 2022-03-18 LAB — CEA (IN HOUSE-CHCC): CEA (CHCC-In House): 5609.57 ng/mL — ABNORMAL HIGH (ref 0.00–5.00)

## 2022-03-18 LAB — MAGNESIUM: Magnesium: 2 mg/dL (ref 1.7–2.4)

## 2022-03-18 MED ORDER — SODIUM CHLORIDE 0.9 % IV SOLN: Freq: Once | INTRAVENOUS | Status: AC

## 2022-03-18 MED ORDER — SODIUM CHLORIDE 0.9 % IV SOLN
1.1000 mg/m2 | Freq: Once | INTRAVENOUS | Status: AC
  Administered 2022-03-18: 2 mg via INTRAVENOUS
  Filled 2022-03-18: qty 4

## 2022-03-18 MED ORDER — ONDANSETRON HCL 4 MG/2ML IJ SOLN
8.0000 mg | Freq: Once | INTRAMUSCULAR | Status: AC
  Administered 2022-03-18: 8 mg via INTRAVENOUS
  Filled 2022-03-18: qty 4

## 2022-03-18 MED ORDER — HEPARIN SOD (PORK) LOCK FLUSH 100 UNIT/ML IV SOLN
500.0000 [IU] | Freq: Once | INTRAVENOUS | Status: AC | PRN
  Administered 2022-03-18: 500 [IU]

## 2022-03-18 MED ORDER — SODIUM CHLORIDE 0.9% FLUSH
10.0000 mL | Freq: Once | INTRAVENOUS | Status: AC
  Administered 2022-03-18: 10 mL

## 2022-03-18 MED ORDER — SODIUM CHLORIDE 0.9% FLUSH
10.0000 mL | INTRAVENOUS | Status: DC | PRN
  Administered 2022-03-18: 10 mL

## 2022-03-18 NOTE — Assessment & Plan Note (Addendum)
07/19/2014: Right breast cancer grade 2 IDC ER/PR negative HER2 positive Ki-67 90% treated with neoadjuvant chemo with TCHP x6 cycles followed by Herceptin maintenance, right lumpectomy had residual disease 4 cm 1/3 lymph node positive, ER/PR negative, adjuvant radiation and capecitabine completed 09/27/2015 12/06/2015: Recurrence: Right subpectoral lymph node and subcutaneous nodule, liver mass (biopsy adenocarcinoma consistent with upper GI or pancreaticobiliary primary, HER2 negative, ER negative) July 2019: Metastatic disease: TDM 1 followed by Herceptin maintenance, pertuzumab added 10/06/2018, Abraxane 12/31/2017-09/01/2018, Herceptin and pertuzumab maintenance   Prior treatment: Enhertu started 10/05/2020 switched to Herceptin maintenance 02/19/2021 switched to Enhertu at higher dosage x 3 cycles discontinued 08/01/2021 , Xeloda  started 11/12/2021 discontinued December 2023 for progression   02/04/2021: PET CT scan: Complete metabolic response, no evidence of hypermetabolic disease in the liver (mass measured 2.7 cm previously is now 1.5 cm with no uptake)    11/02/2021: CT CAP: Increase in the hepatic metastatic lesions (7.5 cm to 8.3 cm) similar size of aortocaval, periportal, gastrohepatic lymph nodes ------------------------------------------------------------------------------------------------------------------------------------------- CT CAP 01/18/2022: Progressive metastatic disease in the liver (10.7 cm x 13 cm) and lymph nodes in the upper abdomen and retroperitoneum (2.9 cm and 2.3 cm and 1.7 cm)   Current treatment: Eribulin started 01/30/2022 Eribulin toxicities:  Leukopenia: We may keep the dosage the same if ANC is greater than 1000 she may need Neulasta after day 8 if necessary. Anemia: Slightly worse since she started chemo.  Fatigue  Tumor markers: Rising CEA levels are concerning that the cancer may not be responding to treatment.  Therefore we will obtain scans for further  assessment to make a decision on switching her treatments.  Patient has NTRK 1 amplification and PTEN IHC positivity.

## 2022-03-18 NOTE — Patient Instructions (Signed)
[facility]  Discharge Instructions: Thank you for choosing [facility] to provide your oncology and hematology care.   If you have a lab appointment with the [facility], please go directly to the [facility] and check in at the registration area.   Wear comfortable clothing and clothing appropriate for easy access to any Portacath or PICC line.   We strive to give you quality time with your provider. You may need to reschedule your appointment if you arrive late (15 or more minutes).  Arriving late affects you and other patients whose appointments are after yours.  Also, if you miss three or more appointments without notifying the office, you may be dismissed from the clinic at the provider's discretion.      For prescription refill requests, have your pharmacy contact our office and allow 72 hours for refills to be completed.    Today you received the following chemotherapy and/or immunotherapy agents: [place].      To help prevent nausea and vomiting after your treatment, we encourage you to take your nausea medication as directed.  BELOW ARE SYMPTOMS THAT SHOULD BE REPORTED IMMEDIATELY: *FEVER GREATER THAN 100.4 F (38 C) OR HIGHER *CHILLS OR SWEATING *NAUSEA AND VOMITING THAT IS NOT CONTROLLED WITH YOUR NAUSEA MEDICATION *UNUSUAL SHORTNESS OF BREATH *UNUSUAL BRUISING OR BLEEDING *URINARY PROBLEMS (pain or burning when urinating, or frequent urination) *BOWEL PROBLEMS (unusual diarrhea, constipation, pain near the anus) TENDERNESS IN MOUTH AND THROAT WITH OR WITHOUT PRESENCE OF ULCERS (sore throat, sores in mouth, or a toothache) UNUSUAL RASH, SWELLING OR PAIN  UNUSUAL VAGINAL DISCHARGE OR ITCHING   Items with * indicate a potential emergency and should be followed up as soon as possible or go to the Emergency Department if any problems should occur.  Please show the CHEMOTHERAPY ALERT CARD or IMMUNOTHERAPY ALERT CARD at  check-in to the Emergency Department and triage nurse.  Should you have questions after your visit or need to cancel or reschedule your appointment, please contact [facility]  Dept: [phone]  and follow the prompts.  Office hours are 8:00 a.m. to 4:30 p.m. Monday - Friday. Please note that voicemails left after 4:00 p.m. may not be returned until the following business day.  We are closed weekends and major holidays. You have access to a nurse at all times for urgent questions. Please call the main number to the clinic Dept: [phone] and follow the prompts.   For any non-urgent questions, you may also contact your provider using MyChart. We now offer e-Visits for anyone 43 and older to request care online for non-urgent symptoms. For details visit [email or web address].   Also download the MyChart app! Go to the app store, search "MyChart", open the app, select [facility], and log in with your MyChart username and password.

## 2022-03-19 ENCOUNTER — Other Ambulatory Visit: Payer: Self-pay | Admitting: Hematology and Oncology

## 2022-03-20 ENCOUNTER — Encounter: Payer: Self-pay | Admitting: Hematology and Oncology

## 2022-03-21 ENCOUNTER — Other Ambulatory Visit: Payer: Self-pay | Admitting: *Deleted

## 2022-03-21 ENCOUNTER — Encounter: Payer: Self-pay | Admitting: Hematology and Oncology

## 2022-03-21 DIAGNOSIS — C50411 Malignant neoplasm of upper-outer quadrant of right female breast: Secondary | ICD-10-CM

## 2022-03-21 DIAGNOSIS — R269 Unspecified abnormalities of gait and mobility: Secondary | ICD-10-CM | POA: Diagnosis not present

## 2022-03-21 DIAGNOSIS — R2681 Unsteadiness on feet: Secondary | ICD-10-CM | POA: Diagnosis not present

## 2022-03-21 DIAGNOSIS — Z7409 Other reduced mobility: Secondary | ICD-10-CM | POA: Diagnosis not present

## 2022-03-21 MED ORDER — PROCHLORPERAZINE MALEATE 10 MG PO TABS: 10.0000 mg | ORAL_TABLET | Freq: Four times a day (QID) | ORAL | 6 refills | Status: DC | PRN

## 2022-03-21 MED ORDER — ONDANSETRON HCL 8 MG PO TABS: 8.0000 mg | ORAL_TABLET | Freq: Three times a day (TID) | ORAL | 2 refills | Status: DC | PRN

## 2022-03-21 NOTE — Progress Notes (Signed)
Patient Care Team: Isaac Bliss, Rayford Halsted, MD as PCP - General (Internal Medicine) Larey Dresser, MD as PCP - Advanced Heart Failure (Cardiology) Stark Klein, MD as Consulting Physician (General Surgery) Jacelyn Pi, MD as Consulting Physician (Endocrinology) [name], MD as Consulting Physician (Plastic Surgery) Gery Pray, MD as Consulting Physician (Radiation Oncology) Juanita Craver, MD as Consulting Physician (Gastroenterology) Alda Berthold, DO as Consulting Physician (Neurology) Starlyn Skeans, MD as Consulting Physician (Family Medicine) Sherlynn Stalls, MD as Consulting Physician (Ophthalmology) Salvadore Dom, MD as Consulting Physician (Obstetrics and Gynecology) Raina Mina, RPH-CPP (Pharmacist) Nicholas Lose, MD as Consulting Physician (Hematology and Oncology)  DIAGNOSIS: No diagnosis found.  SUMMARY OF ONCOLOGIC HISTORY: Oncology History  Breast cancer of upper-outer quadrant of right female breast ([place])  07/19/2014 Initial Biopsy   (R) breast needle biopsy (10:00): IDC, DCIS, grade 2. ER-, PR-, HER2+ (ratio 2.42). Ki67 90%.    07/22/2014 Initial Diagnosis   Breast cancer of upper-outer quadrant of right female breast   08/08/2014 - 11/21/2014 Neo-Adjuvant Chemotherapy   Taxotere/Carbo/Herceptin/Perjeta x 6 cycles.    12/12/2014 - 10/02/2015 Chemotherapy   Maintenance Herceptin (to complete 1 year of therapy).    12/27/2014 Surgery   (R) lumpectomy with SL[facility]): IDC, grade 2, spanning 4 cm, associated high grade DCIS. Negative margins. 1/3 SLN (+).   ER/PR repeated and remain negative.   ypT2, ypN1a: Stage IIB   01/06/2015 Surgery   Bilateral breast mammoplasty ([place]): Right breast path with intralymphatic emboli of ductal carcinoma. Left breast benign.    02/02/2015 Imaging   CT chest: No findings of metastatic disease in the chest. Right axillary fluid density lesion likely postoperative seroma or hematoma. Small  left subpectoral nodes warrant followup attention.   02/02/2015 Imaging   Bone scan: No scintigraphic evidence of osseous metastatic disease   02/16/2015 Surgery   (R) mastectomy and ALND [facility]): Scattered foci of high grade DCIS, scattered intralymphatic tumor emboli. Margins negative. 0/15 LNs.    04/05/2015 - 05/19/2015 Radiation Therapy   Adjuvant XRT ([place]). The right chest wall was treated to 50.4 Gy in 28 fractions at 1.8 Gy per fraction.  The right mastectomy scar was treated to 10 Gy in 5 fractions at 2 Gy per fraction.    12/06/2015 PET scan   Right subpectoral lymph node measuring 9 mm and exhibits malignant range FDG uptake, suspicious for metastatic adenopathy. Subcutaneous nodule within the upper left chest wall exhibits malignant range uptake, which is indeterminate; this is at the site of previous Port-A-Cath and is favored to represent postsurgical change. Indeterminate left adrenal nodules which exhibit mild uptake, favored to represent benign adenoma.   08/11/2017 - 11/03/2017 Chemotherapy   The patient had ado-trastuzumab emtansine (KADCYLA) 320 mg in sodium chloride 0.9 % 250 mL chemo infusion, 3.6 mg/kg = 320 mg, Intravenous, Once, 4 of 5 cycles Administration: 320 mg (08/12/2017), 320 mg (09/01/2017), 320 mg (09/23/2017)  for chemotherapy treatment.    11/18/2017 - 08/30/2020 Chemotherapy   The patient had trastuzumab (HERCEPTIN) 546 mg in sodium chloride 0.9 % 250 mL chemo infusion, 6 mg/kg = 546 mg (100 % of original dose 6 mg/kg), Intravenous,  Once, 2 of 2 cycles Dose modification: 6 mg/kg (original dose 6 mg/kg, Cycle 1, Reason: Provider Judgment) Administration: 546 mg (11/18/2017), 546 mg (12/16/2017) pertuzumab (PERJETA) 420 mg in sodium chloride 0.9 % 250 mL chemo infusion, 420 mg (100 % of original dose 420 mg), Intravenous, Once, 15 of 15 cycles Dose  modification: 420 mg (original dose 420 mg, Cycle 18), 420 mg (original dose 420 mg, Cycle 27) Administration: 420  mg (10/06/2018), 420 mg (10/27/2018), 420 mg (11/18/2018), 420 mg (12/08/2018), 420 mg (12/29/2018), 420 mg (01/19/2019), 420 mg (02/16/2019), 420 mg (03/16/2019), 420 mg (04/13/2019), 420 mg (06/08/2019), 420 mg (07/06/2019), 420 mg (08/03/2019), 420 mg (08/31/2019), 420 mg (09/28/2019), 420 mg (05/19/2019) trastuzumab-anns (KANJINTI) 546 mg in sodium chloride 0.9 % 250 mL chemo infusion, 6 mg/kg = 546 mg (100 % of original dose 6 mg/kg), Intravenous,  Once, 30 of 30 cycles Dose modification: 6 mg/kg (original dose 6 mg/kg, Cycle 3, Reason: Other (see comments), Comment: requested step therapy by [facility] insurance), 4 mg/kg (original dose 6 mg/kg, Cycle 9, Reason: Provider Judgment), 6 mg/kg (original dose 6 mg/kg, Cycle 17, Reason: Provider Judgment), 4 mg/kg (original dose 6 mg/kg, Cycle 27, Reason: Provider Judgment), 6 mg/kg (original dose 6 mg/kg, Cycle 27, Reason: Provider Judgment) Administration: 546 mg (01/13/2018), 546 mg (02/10/2018), 546 mg (03/03/2018), 546 mg (03/24/2018), 546 mg (04/14/2018), 546 mg (05/05/2018), 357 mg (05/26/2018), 357 mg (06/09/2018), 357 mg (06/23/2018), 357 mg (07/07/2018), 357 mg (07/21/2018), 357 mg (08/04/2018), 357 mg (08/18/2018), 357 mg (09/01/2018), 546 mg (09/15/2018), 546 mg (10/06/2018), 546 mg (10/27/2018), 546 mg (11/18/2018), 546 mg (12/08/2018), 546 mg (12/29/2018), 546 mg (01/19/2019), 546 mg (02/16/2019), 480 mg (03/16/2019), 483 mg (04/13/2019), 450 mg (06/08/2019), 450 mg (07/06/2019), 450 mg (08/03/2019), 450 mg (08/31/2019), 450 mg (09/28/2019)  for chemotherapy treatment.    12/31/2017 - 09/01/2018 Chemotherapy   The patient had PACLitaxel-protein bound (ABRAXANE) chemo infusion 175 mg, 80 mg/m2 = 175 mg (100 % of original dose 80 mg/m2), Intravenous,  Once, 5 of 10 cycles Dose modification: 80 mg/m2 (original dose 80 mg/m2, Cycle 11) Administration: 175 mg (07/07/2018), 175 mg (07/21/2018), 175 mg (08/04/2018), 175 mg (08/18/2018), 175 mg (09/01/2018) PACLitaxel-protein bound (ABRAXANE) chemo  infusion 200 mg, 100 mg/m2 = 200 mg, Intravenous, Once, 10 of 10 cycles Dose modification: 80 mg/m2 (original dose 100 mg/m2, Cycle 2, Reason: Provider Judgment), 80 mg/m2 (original dose 100 mg/m2, Cycle 2, Reason: Provider Judgment) Administration: 200 mg (12/31/2017), 200 mg (01/06/2018), 175 mg (01/20/2018), 175 mg (01/27/2018), 175 mg (02/10/2018), 175 mg (03/03/2018), 175 mg (03/10/2018), 175 mg (03/24/2018), 175 mg (04/07/2018), 175 mg (04/14/2018), 175 mg (04/28/2018), 175 mg (05/05/2018), 175 mg (05/26/2018), 175 mg (06/09/2018), 175 mg (05/19/2018)  for chemotherapy treatment.    10/05/2020 - 01/18/2021 Chemotherapy   Patient is on Treatment Plan : BREAST METASTATIC fam-trastuzumab deruxtecan-nxki (Enhertu) q21d     02/19/2021 - 05/14/2021 Chemotherapy   Patient is on Treatment Plan : BREAST Trastuzumab q21d x 13 cycles     06/20/2021 - 08/01/2021 Chemotherapy   Patient is on Treatment Plan : BREAST METASTATIC fam-trastuzumab deruxtecan-nxki (Enhertu) q21d     07/25/2021 - 10/19/2021 Chemotherapy   Patient is on Treatment Plan : BREAST METASTATIC Sacituzumab govitecan-hziy Ivette Loyal) D1,8 q21d     08/31/2021 - 09/21/2021 Chemotherapy   Patient is on Treatment Plan : BREAST METASTATIC Sacituzumab govitecan-hziy Ivette Loyal) q21d     01/30/2022 -  Chemotherapy   Patient is on Treatment Plan : BREAST METASTATIC Eribulin D1,8 q21d     Metastasis to liver ([place])  08/04/2017 Initial Diagnosis   Metastasis to liver ([place])   08/12/2017 - 10/14/2017 Chemotherapy   The patient had ado-trastuzumab emtansine (KADCYLA) 320 mg in sodium chloride 0.9 % 250 mL chemo infusion, 3.6 mg/kg = 320 mg, Intravenous, Once, 4 of 5 cycles Administration: 320 mg (08/12/2017), 320  mg (09/01/2017), 320 mg (09/23/2017)  for chemotherapy treatment.    11/18/2017 - 08/30/2020 Chemotherapy   The patient had trastuzumab (HERCEPTIN) 546 mg in sodium chloride 0.9 % 250 mL chemo infusion, 6 mg/kg = 546 mg (100 % of original dose 6 mg/kg), Intravenous,   Once, 2 of 2 cycles Dose modification: 6 mg/kg (original dose 6 mg/kg, Cycle 1, Reason: Provider Judgment) Administration: 546 mg (11/18/2017), 546 mg (12/16/2017) pertuzumab (PERJETA) 420 mg in sodium chloride 0.9 % 250 mL chemo infusion, 420 mg (100 % of original dose 420 mg), Intravenous, Once, 15 of 15 cycles Dose modification: 420 mg (original dose 420 mg, Cycle 18), 420 mg (original dose 420 mg, Cycle 27) Administration: 420 mg (10/06/2018), 420 mg (10/27/2018), 420 mg (11/18/2018), 420 mg (12/08/2018), 420 mg (12/29/2018), 420 mg (01/19/2019), 420 mg (02/16/2019), 420 mg (03/16/2019), 420 mg (04/13/2019), 420 mg (06/08/2019), 420 mg (07/06/2019), 420 mg (08/03/2019), 420 mg (08/31/2019), 420 mg (09/28/2019), 420 mg (05/19/2019) trastuzumab-anns (KANJINTI) 546 mg in sodium chloride 0.9 % 250 mL chemo infusion, 6 mg/kg = 546 mg (100 % of original dose 6 mg/kg), Intravenous,  Once, 30 of 30 cycles Dose modification: 6 mg/kg (original dose 6 mg/kg, Cycle 3, Reason: Other (see comments), Comment: requested step therapy by [facility] insurance), 4 mg/kg (original dose 6 mg/kg, Cycle 9, Reason: Provider Judgment), 6 mg/kg (original dose 6 mg/kg, Cycle 17, Reason: Provider Judgment), 4 mg/kg (original dose 6 mg/kg, Cycle 27, Reason: Provider Judgment), 6 mg/kg (original dose 6 mg/kg, Cycle 27, Reason: Provider Judgment) Administration: 546 mg (01/13/2018), 546 mg (02/10/2018), 546 mg (03/03/2018), 546 mg (03/24/2018), 546 mg (04/14/2018), 546 mg (05/05/2018), 357 mg (05/26/2018), 357 mg (06/09/2018), 357 mg (06/23/2018), 357 mg (07/07/2018), 357 mg (07/21/2018), 357 mg (08/04/2018), 357 mg (08/18/2018), 357 mg (09/01/2018), 546 mg (09/15/2018), 546 mg (10/06/2018), 546 mg (10/27/2018), 546 mg (11/18/2018), 546 mg (12/08/2018), 546 mg (12/29/2018), 546 mg (01/19/2019), 546 mg (02/16/2019), 480 mg (03/16/2019), 483 mg (04/13/2019), 450 mg (06/08/2019), 450 mg (07/06/2019), 450 mg (08/03/2019), 450 mg (08/31/2019), 450 mg (09/28/2019)  for chemotherapy  treatment.    01/30/2022 -  Chemotherapy   Patient is on Treatment Plan : BREAST METASTATIC Eribulin D1,8 q21d       CHIEF COMPLIANT: Follow-up on Eribulin   INTERVAL HISTORY: Darlene [name] is a 72 Year-old- with the above mentioned right breast cancer. Currently on Xeloda. She presents to the clinic for a follow-up.   ALLERGIES:  is allergic to antihistamines, diphenhydramine-type; nsaids; peanut allergen powder-dnfp; and tape.  MEDICATIONS:  Current Outpatient Medications  Medication Sig Dispense Refill   Accu-Chek Softclix Lancets lancets 1 each by Other route 4 (four) times daily. Test blood sugars 4 x times daily 200 each 1   aspirin EC 81 MG tablet Take 1 tablet (81 mg total) by mouth daily. 90 tablet 3   Bempedoic Acid-Ezetimibe (NEXLIZET) 180-10 MG TABS Take 1 tablet by mouth daily. 90 tablet 3   Blood Glucose Calibration (ACCU-CHEK GUIDE CONTROL) LIQD 1 Bottle by In Vitro route as needed. 1 each 0   Blood Glucose Monitoring Suppl (ACCU-CHEK GUIDE) w/Device KIT 1 each by Does not apply route 4 (four) times daily. Use to test blood sugars 4x a day 1 kit 0   buPROPion (WELLBUTRIN SR) 200 MG 12 hr tablet Take one tablet by mouth once daily. 90 tablet 0   CALCIUM CITRATE-VITAMIN D PO Take 1 tablet by mouth 3 (three) times daily.      carvedilol (COREG) 6.25 MG tablet  Take 1 tablet (6.25 mg total) by mouth 2 (two) times daily. 180 tablet 3   cholecalciferol (VITAMIN D) 1000 units tablet Take 1,000 Units by mouth daily.     Cyanocobalamin (VITAMIN B-12 PO) Take 1 tablet by mouth every 14 (fourteen) days.     glucose blood (ACCU-CHEK GUIDE) test strip Use Accu Chek test strips to check blood sugar 4 times daily. DX:E11.21 400 each 3   insulin aspart (NOVOLOG) 100 UNIT/ML injection Inject into the skin daily as needed for high blood sugar. Per patient, sliding scale with meals as needed     insulin glargine (LANTUS SOLOSTAR) 100 UNIT/ML Solostar Pen Inject 8 Units into the skin  daily. 7 mL 3   KERENDIA 20 MG TABS TAKE 1 TABLET EVERY DAY 90 tablet 3   Lactobacillus ([employer] ACIDOPHILUS PROBIOTIC) 0.5 MG TABS Take 1 tablet by mouth daily.     loperamide (IMODIUM) 2 MG capsule Take 2 mg by mouth as needed for diarrhea or loose stools.     melatonin 5 MG TABS Take 5 mg by mouth at bedtime.     Multiple Vitamins-Minerals (OPURITY) TABS Take by mouth.     ondansetron (ZOFRAN) 8 MG tablet Take 1 tablet (8 mg total) by mouth every 8 (eight) hours as needed for nausea or vomiting. 30 tablet 2   polyethylene glycol (MIRALAX / GLYCOLAX) 17 g packet Take 17 g by mouth daily.     prochlorperazine (COMPAZINE) 10 MG tablet Take 1 tablet (10 mg total) by mouth every 6 (six) hours as needed (Nausea or vomiting). 30 tablet 6   Propylene Glycol (SYSTANE BALANCE OP) Place 1-2 drops into both eyes 3 (three) times daily as needed (for dry eyes).      SURE COMFORT PEN NEEDLES 32G X 4 MM MISC Use as instructed to inject insulin and Victoza daily. 200 each 0   tobramycin (TOBREX) 0.3 % ophthalmic ointment Place 1 application into both eyes as needed (Takes day before eye injections).     vortioxetine HBr (TRINTELLIX) 20 MG TABS tablet Take 1 tablet (20 mg total) by mouth at bedtime. 90 tablet 0   No current facility-administered medications for this visit.   Facility-Administered Medications Ordered in Other Visits  Medication Dose Route Frequency Provider Last Rate Last Admin   acetaminophen (TYLENOL) 325 MG tablet            diphenhydrAMINE (BENADRYL) 50 MG/ML injection            famotidine (PEPCID) 20-0.9 MG/50ML-% IVPB            palonosetron (ALOXI) 0.25 MG/5ML injection             PHYSICAL EXAMINATION: ECOG PERFORMANCE STATUS: {CHL ONC ECOG [phone]}  There were no vitals filed for this visit. There were no vitals filed for this visit.  BREAST:*** No palpable masses or nodules in either right or left breasts. No palpable axillary supraclavicular or infraclavicular adenopathy  no breast tenderness or nipple discharge. (exam performed in the presence of a chaperone)  LABORATORY DATA:  I have reviewed the data as listed    Latest Ref Rng & Units 03/18/2022    8:54 AM 03/04/2022    8:44 AM 02/25/2022    2:01 PM  CMP  Glucose 70 - 99 mg/dL 196  257  265   BUN 8 - 23 mg/dL 35  35  53   Creatinine 0.44 - 1.00 mg/dL 1.50  1.46  1.79   Sodium 135 -  145 mmol/L 138  136  135   Potassium 3.5 - 5.1 mmol/L 4.6  4.9  4.9   Chloride 98 - 111 mmol/L 104  105  100   CO2 22 - 32 mmol/L '27  25  26   '$ Calcium 8.9 - 10.3 mg/dL 8.5  9.0  9.0   Total Protein 6.5 - 8.1 g/dL 6.5  6.5  6.2   Total Bilirubin 0.3 - 1.2 mg/dL 0.5  0.5  0.5   Alkaline Phos 38 - 126 U/L 206  149  182   AST 15 - 41 U/L 71  31  57   ALT 0 - 44 U/L '22  14  18     '$ Lab Results  Component Value Date   WBC 18.0 (H) 03/18/2022   HGB 10.1 (L) 03/18/2022   HCT 30.3 (L) 03/18/2022   MCV 102.0 (H) 03/18/2022   PLT 164 03/18/2022   NEUTROABS 16.6 (H) 03/18/2022    ASSESSMENT & PLAN:  No problem-specific Assessment & Plan notes found for this encounter.    No orders of the defined types were placed in this encounter.  The patient has a good understanding of the overall plan. she agrees with it. she will call with any problems that may develop before the next visit here. Total time spent: 30 mins including face to face time and time spent for planning, charting and co-ordination of care   Suzzette Righter, [place] 03/21/22    I Gardiner Coins am acting as a Education administrator for [employer]  ***

## 2022-03-23 ENCOUNTER — Ambulatory Visit (HOSPITAL_BASED_OUTPATIENT_CLINIC_OR_DEPARTMENT_OTHER)
Admission: RE | Admit: 2022-03-23 | Discharge: 2022-03-23 | Disposition: A | Discharge status: Home / Self Care | Payer: Medicare PPO | Source: Ambulatory Visit | Attending: Hematology and Oncology | Admitting: Hematology and Oncology

## 2022-03-23 DIAGNOSIS — C787 Secondary malignant neoplasm of liver and intrahepatic bile duct: Secondary | ICD-10-CM | POA: Diagnosis not present

## 2022-03-23 DIAGNOSIS — R918 Other nonspecific abnormal finding of lung field: Secondary | ICD-10-CM | POA: Diagnosis not present

## 2022-03-23 DIAGNOSIS — N200 Calculus of kidney: Secondary | ICD-10-CM | POA: Diagnosis not present

## 2022-03-23 DIAGNOSIS — K5730 Diverticulosis of large intestine without perforation or abscess without bleeding: Secondary | ICD-10-CM | POA: Diagnosis not present

## 2022-03-23 DIAGNOSIS — Z171 Estrogen receptor negative status [ER-]: Secondary | ICD-10-CM | POA: Insufficient documentation

## 2022-03-23 DIAGNOSIS — C50411 Malignant neoplasm of upper-outer quadrant of right female breast: Secondary | ICD-10-CM | POA: Diagnosis not present

## 2022-03-23 MED ORDER — IOHEXOL 300 MG/ML  SOLN
80.0000 mL | Freq: Once | INTRAMUSCULAR | Status: AC | PRN
  Administered 2022-03-23: 80 mL via INTRAVENOUS

## 2022-03-25 ENCOUNTER — Inpatient Hospital Stay: Payer: Medicare PPO | Attending: Hematology and Oncology

## 2022-03-25 ENCOUNTER — Inpatient Hospital Stay: Payer: Medicare PPO

## 2022-03-25 ENCOUNTER — Inpatient Hospital Stay (HOSPITAL_BASED_OUTPATIENT_CLINIC_OR_DEPARTMENT_OTHER): Payer: Medicare PPO | Admitting: Hematology and Oncology

## 2022-03-25 VITALS — BP 106/43 | HR 86 | Temp 98.1°F | Resp 20 | Wt 174.1 lb

## 2022-03-25 DIAGNOSIS — Z171 Estrogen receptor negative status [ER-]: Secondary | ICD-10-CM

## 2022-03-25 DIAGNOSIS — Z5111 Encounter for antineoplastic chemotherapy: Secondary | ICD-10-CM | POA: Diagnosis not present

## 2022-03-25 DIAGNOSIS — Z5189 Encounter for other specified aftercare: Secondary | ICD-10-CM | POA: Insufficient documentation

## 2022-03-25 DIAGNOSIS — R188 Other ascites: Secondary | ICD-10-CM | POA: Insufficient documentation

## 2022-03-25 DIAGNOSIS — C787 Secondary malignant neoplasm of liver and intrahepatic bile duct: Secondary | ICD-10-CM

## 2022-03-25 DIAGNOSIS — R110 Nausea: Secondary | ICD-10-CM | POA: Diagnosis not present

## 2022-03-25 DIAGNOSIS — D649 Anemia, unspecified: Secondary | ICD-10-CM | POA: Diagnosis not present

## 2022-03-25 DIAGNOSIS — R5383 Other fatigue: Secondary | ICD-10-CM | POA: Diagnosis not present

## 2022-03-25 DIAGNOSIS — C50411 Malignant neoplasm of upper-outer quadrant of right female breast: Secondary | ICD-10-CM | POA: Insufficient documentation

## 2022-03-25 DIAGNOSIS — D72819 Decreased white blood cell count, unspecified: Secondary | ICD-10-CM | POA: Insufficient documentation

## 2022-03-25 DIAGNOSIS — R739 Hyperglycemia, unspecified: Secondary | ICD-10-CM | POA: Insufficient documentation

## 2022-03-25 DIAGNOSIS — Z452 Encounter for adjustment and management of vascular access device: Secondary | ICD-10-CM | POA: Diagnosis not present

## 2022-03-25 DIAGNOSIS — Z95828 Presence of other vascular implants and grafts: Secondary | ICD-10-CM

## 2022-03-25 LAB — CBC WITH DIFFERENTIAL (CANCER CENTER ONLY)
Abs Immature Granulocytes: 0.48 10*3/uL — ABNORMAL HIGH (ref 0.00–0.07)
Basophils Absolute: 0 10*3/uL (ref 0.0–0.1)
Basophils Relative: 0 %
Eosinophils Absolute: 0 10*3/uL (ref 0.0–0.5)
Eosinophils Relative: 0 %
HCT: 28.6 % — ABNORMAL LOW (ref 36.0–46.0)
Hemoglobin: 9.5 g/dL — ABNORMAL LOW (ref 12.0–15.0)
Immature Granulocytes: 9 %
Lymphocytes Relative: 12 %
Lymphs Abs: 0.7 10*3/uL (ref 0.7–4.0)
MCH: 33.3 pg (ref 26.0–34.0)
MCHC: 33.2 g/dL (ref 30.0–36.0)
MCV: 100.4 fL — ABNORMAL HIGH (ref 80.0–100.0)
Monocytes Absolute: 0.4 10*3/uL (ref 0.1–1.0)
Monocytes Relative: 8 %
Neutro Abs: 3.9 10*3/uL (ref 1.7–7.7)
Neutrophils Relative %: 71 %
Platelet Count: 249 10*3/uL (ref 150–400)
RBC: 2.85 MIL/uL — ABNORMAL LOW (ref 3.87–5.11)
RDW: 15.4 % (ref 11.5–15.5)
WBC Count: 5.5 10*3/uL (ref 4.0–10.5)
nRBC: 0 % (ref 0.0–0.2)

## 2022-03-25 LAB — CMP (CANCER CENTER ONLY)
ALT: 19 U/L (ref 0–44)
AST: 67 U/L — ABNORMAL HIGH (ref 15–41)
Albumin: 3.5 g/dL (ref 3.5–5.0)
Alkaline Phosphatase: 184 U/L — ABNORMAL HIGH (ref 38–126)
Anion gap: 9 (ref 5–15)
BUN: 35 mg/dL — ABNORMAL HIGH (ref 8–23)
CO2: 26 mmol/L (ref 22–32)
Calcium: 9.4 mg/dL (ref 8.9–10.3)
Chloride: 105 mmol/L (ref 98–111)
Creatinine: 1.78 mg/dL — ABNORMAL HIGH (ref 0.44–1.00)
GFR, Estimated: 31 mL/min — ABNORMAL LOW (ref >60)
Glucose, Bld: 218 mg/dL — ABNORMAL HIGH (ref 70–99)
Potassium: 4.8 mmol/L (ref 3.5–5.1)
Sodium: 140 mmol/L (ref 135–145)
Total Bilirubin: 0.5 mg/dL (ref 0.3–1.2)
Total Protein: 6.5 g/dL (ref 6.5–8.1)

## 2022-03-25 LAB — CEA (IN HOUSE-CHCC): CEA (CHCC-In House): 6353.03 ng/mL — ABNORMAL HIGH (ref 0.00–5.00)

## 2022-03-25 LAB — MAGNESIUM: Magnesium: 2.1 mg/dL (ref 1.7–2.4)

## 2022-03-25 MED ORDER — SODIUM CHLORIDE 0.9% FLUSH
10.0000 mL | Freq: Once | INTRAVENOUS | Status: AC
  Administered 2022-03-25: 10 mL

## 2022-03-25 NOTE — Assessment & Plan Note (Addendum)
07/19/2014: Right breast cancer grade 2 IDC ER/PR negative HER2 positive Ki-67 90% treated with neoadjuvant chemo with TCHP x6 cycles followed by Herceptin maintenance, right lumpectomy had residual disease 4 cm 1/3 lymph node positive, ER/PR negative, adjuvant radiation and capecitabine completed 09/27/2015 12/06/2015: Recurrence: Right subpectoral lymph node and subcutaneous nodule, liver mass (biopsy adenocarcinoma consistent with upper GI or pancreaticobiliary primary, HER2 negative, ER negative) July 2019: Metastatic disease: TDM 1 followed by Herceptin maintenance, pertuzumab added 10/06/2018, Abraxane 12/31/2017-09/01/2018, Herceptin and pertuzumab maintenance   Prior treatment: Enhertu started 10/05/2020 switched to Herceptin maintenance 02/19/2021 switched to Enhertu at higher dosage x 3 cycles discontinued 08/01/2021 , Xeloda  started 11/12/2021 discontinued December 2023 for progression   02/04/2021: PET CT scan: Complete metabolic response, no evidence of hypermetabolic disease in the liver (mass measured 2.7 cm previously is now 1.5 cm with no uptake)    11/02/2021: CT CAP: Increase in the hepatic metastatic lesions (7.5 cm to 8.3 cm) similar size of aortocaval, periportal, gastrohepatic lymph nodes ------------------------------------------------------------------------------------------------------------------------------------------- CT CAP 01/18/2022: Progressive metastatic disease in the liver (10.7 cm x 13 cm) and lymph nodes in the upper abdomen and retroperitoneum (2.9 cm and 2.3 cm and 1.7 cm)   Current treatment: Eribulin started 01/30/2022 Eribulin toxicities:  Leukopenia: We may keep the dosage the same if ANC is greater than 1000 she may need Neulasta after day 8 if necessary. Anemia: Slightly worse since she started chemo.  Fatigue   Tumor markers: Rising CEA levels are concerning that the cancer may not be responding to treatment 03/23/2022: CT CAP: Further progression of metastatic  disease interval increase in size and number of mets in the liver, abdominal and retroperitoneal lymph nodes and mediastinal lymph nodes and numerous new pulmonary nodules  Treatment options: Gemcitabine and carboplatin, Doxil, Navelbine etc

## 2022-03-26 ENCOUNTER — Ambulatory Visit: Payer: Medicare PPO

## 2022-03-26 ENCOUNTER — Inpatient Hospital Stay: Payer: Medicare PPO

## 2022-03-26 ENCOUNTER — Other Ambulatory Visit: Payer: Self-pay | Admitting: Hematology and Oncology

## 2022-03-26 DIAGNOSIS — C50411 Malignant neoplasm of upper-outer quadrant of right female breast: Secondary | ICD-10-CM

## 2022-03-26 DIAGNOSIS — C787 Secondary malignant neoplasm of liver and intrahepatic bile duct: Secondary | ICD-10-CM

## 2022-03-26 NOTE — Progress Notes (Signed)
Discussed with the patient's husband and the patient extensively about her prognosis and treatment options. We will proceed with carboplatin and gemcitabine chemotherapy. We will also request interventional radiology to consider her for Y90 treatment.

## 2022-03-26 NOTE — Progress Notes (Signed)
DISCONTINUE ON PATHWAY REGIMEN - Breast     A cycle is every 21 days:     Eribulin mesylate   **Always confirm dose/schedule in your pharmacy ordering system**  REASON: Disease Progression PRIOR TREATMENT: [id number]: Eribulin 1.4 mg/m2 D1, 8 q21 Days TREATMENT RESPONSE: Progressive Disease (PD)  START OFF PATHWAY REGIMEN - Breast   [id number]:Carboplatin AUC=2 IV D1,8 + Gemcitabine 1,000 mg/m2 IV D1,8 q21 Days:   A cycle is every 21 days:     Carboplatin      Gemcitabine   **Always confirm dose/schedule in your pharmacy ordering system**  Patient Characteristics: Distant Metastases or Locoregional Recurrent Disease - Unresected or Locally Advanced Unresectable Disease Progressing after Neoadjuvant and Local Therapies, HER2 Low/Negative, ER Negative, Chemotherapy, HER2 Negative, Third Line and Beyond, [employer] or Not a Candidate for Molecular Targeted Therapy Therapeutic Status: Distant Metastases HER2 Status: Negative (-) ER Status: Negative (-) PR Status: Negative (-) Therapy Approach Indicated: Standard Chemotherapy/Endocrine Therapy Line of Therapy: Third Line and Beyond Intent of Therapy: Non-Curative / Palliative Intent, Discussed with Patient

## 2022-03-27 ENCOUNTER — Telehealth: Payer: Self-pay | Admitting: Hematology and Oncology

## 2022-03-27 NOTE — Telephone Encounter (Signed)
Scheduled and cancelled appointments per provider and treatment plan changes. Left voicemail.

## 2022-03-28 ENCOUNTER — Other Ambulatory Visit: Payer: Self-pay | Admitting: Hematology and Oncology

## 2022-03-28 DIAGNOSIS — R2681 Unsteadiness on feet: Secondary | ICD-10-CM | POA: Diagnosis not present

## 2022-03-28 DIAGNOSIS — R269 Unspecified abnormalities of gait and mobility: Secondary | ICD-10-CM | POA: Diagnosis not present

## 2022-03-28 DIAGNOSIS — R160 Hepatomegaly, not elsewhere classified: Secondary | ICD-10-CM

## 2022-03-28 DIAGNOSIS — Z7409 Other reduced mobility: Secondary | ICD-10-CM | POA: Diagnosis not present

## 2022-03-29 DIAGNOSIS — I10 Essential (primary) hypertension: Secondary | ICD-10-CM | POA: Diagnosis not present

## 2022-03-29 DIAGNOSIS — R809 Proteinuria, unspecified: Secondary | ICD-10-CM | POA: Diagnosis not present

## 2022-03-29 DIAGNOSIS — E1165 Type 2 diabetes mellitus with hyperglycemia: Secondary | ICD-10-CM | POA: Diagnosis not present

## 2022-03-29 DIAGNOSIS — N182 Chronic kidney disease, stage 2 (mild): Secondary | ICD-10-CM | POA: Diagnosis not present

## 2022-04-01 ENCOUNTER — Encounter: Payer: Self-pay | Admitting: Hematology and Oncology

## 2022-04-01 MED FILL — Dexamethasone Sodium Phosphate Inj 100 MG/10ML: INTRAMUSCULAR | Qty: 1 | Status: AC

## 2022-04-02 ENCOUNTER — Ambulatory Visit
Admission: RE | Admit: 2022-04-02 | Discharge: 2022-04-02 | Disposition: A | Discharge status: Home / Self Care | Payer: Medicare PPO | Source: Ambulatory Visit | Attending: Hematology and Oncology | Admitting: Hematology and Oncology

## 2022-04-02 ENCOUNTER — Telehealth: Payer: Self-pay | Admitting: Internal Medicine

## 2022-04-02 ENCOUNTER — Inpatient Hospital Stay: Payer: Medicare PPO

## 2022-04-02 ENCOUNTER — Other Ambulatory Visit: Payer: Self-pay | Admitting: Interventional Radiology

## 2022-04-02 ENCOUNTER — Encounter: Payer: Self-pay | Admitting: Adult Health

## 2022-04-02 ENCOUNTER — Inpatient Hospital Stay (HOSPITAL_BASED_OUTPATIENT_CLINIC_OR_DEPARTMENT_OTHER): Payer: Medicare PPO | Admitting: Adult Health

## 2022-04-02 ENCOUNTER — Other Ambulatory Visit: Payer: Self-pay | Admitting: Hematology and Oncology

## 2022-04-02 DIAGNOSIS — C7981 Secondary malignant neoplasm of breast: Secondary | ICD-10-CM

## 2022-04-02 DIAGNOSIS — C787 Secondary malignant neoplasm of liver and intrahepatic bile duct: Secondary | ICD-10-CM | POA: Diagnosis not present

## 2022-04-02 DIAGNOSIS — C228 Malignant neoplasm of liver, primary, unspecified as to type: Secondary | ICD-10-CM

## 2022-04-02 DIAGNOSIS — R160 Hepatomegaly, not elsewhere classified: Secondary | ICD-10-CM

## 2022-04-02 DIAGNOSIS — C50411 Malignant neoplasm of upper-outer quadrant of right female breast: Secondary | ICD-10-CM

## 2022-04-02 DIAGNOSIS — Z5111 Encounter for antineoplastic chemotherapy: Secondary | ICD-10-CM | POA: Diagnosis not present

## 2022-04-02 DIAGNOSIS — R739 Hyperglycemia, unspecified: Secondary | ICD-10-CM | POA: Diagnosis not present

## 2022-04-02 DIAGNOSIS — R188 Other ascites: Secondary | ICD-10-CM | POA: Diagnosis not present

## 2022-04-02 DIAGNOSIS — Z171 Estrogen receptor negative status [ER-]: Secondary | ICD-10-CM

## 2022-04-02 DIAGNOSIS — Z95828 Presence of other vascular implants and grafts: Secondary | ICD-10-CM

## 2022-04-02 DIAGNOSIS — R5383 Other fatigue: Secondary | ICD-10-CM | POA: Diagnosis not present

## 2022-04-02 DIAGNOSIS — D72819 Decreased white blood cell count, unspecified: Secondary | ICD-10-CM | POA: Diagnosis not present

## 2022-04-02 DIAGNOSIS — D649 Anemia, unspecified: Secondary | ICD-10-CM | POA: Diagnosis not present

## 2022-04-02 LAB — CBC WITH DIFFERENTIAL (CANCER CENTER ONLY)
Abs Immature Granulocytes: 0.03 10*3/uL (ref 0.00–0.07)
Basophils Absolute: 0 10*3/uL (ref 0.0–0.1)
Basophils Relative: 1 %
Eosinophils Absolute: 0 10*3/uL (ref 0.0–0.5)
Eosinophils Relative: 0 %
HCT: 31.1 % — ABNORMAL LOW (ref 36.0–46.0)
Hemoglobin: 10.2 g/dL — ABNORMAL LOW (ref 12.0–15.0)
Immature Granulocytes: 1 %
Lymphocytes Relative: 17 %
Lymphs Abs: 0.6 10*3/uL — ABNORMAL LOW (ref 0.7–4.0)
MCH: 31.9 pg (ref 26.0–34.0)
MCHC: 32.8 g/dL (ref 30.0–36.0)
MCV: 97.2 fL (ref 80.0–100.0)
Monocytes Absolute: 0.8 10*3/uL (ref 0.1–1.0)
Monocytes Relative: 22 %
Neutro Abs: 2.2 10*3/uL (ref 1.7–7.7)
Neutrophils Relative %: 59 %
Platelet Count: 287 10*3/uL (ref 150–400)
RBC: 3.2 MIL/uL — ABNORMAL LOW (ref 3.87–5.11)
RDW: 15.6 % — ABNORMAL HIGH (ref 11.5–15.5)
WBC Count: 3.7 10*3/uL — ABNORMAL LOW (ref 4.0–10.5)
nRBC: 0 % (ref 0.0–0.2)

## 2022-04-02 LAB — CMP (CANCER CENTER ONLY)
ALT: 33 U/L (ref 0–44)
AST: 114 U/L — ABNORMAL HIGH (ref 15–41)
Albumin: 3.5 g/dL (ref 3.5–5.0)
Alkaline Phosphatase: 233 U/L — ABNORMAL HIGH (ref 38–126)
Anion gap: 8 (ref 5–15)
BUN: 50 mg/dL — ABNORMAL HIGH (ref 8–23)
CO2: 26 mmol/L (ref 22–32)
Calcium: 9.5 mg/dL (ref 8.9–10.3)
Chloride: 101 mmol/L (ref 98–111)
Creatinine: 1.92 mg/dL — ABNORMAL HIGH (ref 0.44–1.00)
GFR, Estimated: 28 mL/min — ABNORMAL LOW (ref >60)
Glucose, Bld: 182 mg/dL — ABNORMAL HIGH (ref 70–99)
Potassium: 4.8 mmol/L (ref 3.5–5.1)
Sodium: 135 mmol/L (ref 135–145)
Total Bilirubin: 0.4 mg/dL (ref 0.3–1.2)
Total Protein: 6.6 g/dL (ref 6.5–8.1)

## 2022-04-02 LAB — CEA (IN HOUSE-CHCC): CEA (CHCC-In House): 6662.45 ng/mL — ABNORMAL HIGH (ref 0.00–5.00)

## 2022-04-02 MED ORDER — SODIUM CHLORIDE 0.9% FLUSH
10.0000 mL | Freq: Once | INTRAVENOUS | Status: AC
  Administered 2022-04-02: 10 mL

## 2022-04-02 MED ORDER — SODIUM CHLORIDE 0.9 % IV SOLN
119.0000 mg | Freq: Once | INTRAVENOUS | Status: AC
  Administered 2022-04-02: 120 mg via INTRAVENOUS
  Filled 2022-04-02: qty 12

## 2022-04-02 MED ORDER — FAMOTIDINE IN NACL 20-0.9 MG/50ML-% IV SOLN
20.0000 mg | Freq: Once | INTRAVENOUS | Status: AC
  Administered 2022-04-02: 20 mg via INTRAVENOUS
  Filled 2022-04-02: qty 50

## 2022-04-02 MED ORDER — SODIUM CHLORIDE 0.9 % IV SOLN
1000.0000 mg/m2 | Freq: Once | INTRAVENOUS | Status: AC
  Administered 2022-04-02: 1976 mg via INTRAVENOUS
  Filled 2022-04-02: qty 51.97

## 2022-04-02 MED ORDER — SODIUM CHLORIDE 0.9% FLUSH
10.0000 mL | INTRAVENOUS | Status: DC | PRN
  Administered 2022-04-02: 10 mL

## 2022-04-02 MED ORDER — HEPARIN SOD (PORK) LOCK FLUSH 100 UNIT/ML IV SOLN
500.0000 [IU] | Freq: Once | INTRAVENOUS | Status: AC | PRN
  Administered 2022-04-02: 500 [IU]

## 2022-04-02 MED ORDER — PALONOSETRON HCL INJECTION 0.25 MG/5ML
0.2500 mg | Freq: Once | INTRAVENOUS | Status: AC
  Administered 2022-04-02: 0.25 mg via INTRAVENOUS
  Filled 2022-04-02: qty 5

## 2022-04-02 MED ORDER — SODIUM CHLORIDE 0.9 % IV SOLN: Freq: Once | INTRAVENOUS | Status: AC

## 2022-04-02 MED ORDER — SODIUM CHLORIDE 0.9 % IV SOLN
10.0000 mg | Freq: Once | INTRAVENOUS | Status: AC
  Administered 2022-04-02: 10 mg via INTRAVENOUS
  Filled 2022-04-02: qty 10

## 2022-04-02 NOTE — Consult Note (Signed)
Chief Complaint: Metastatic breast cancer   Referring Physician(s): Alois Cliche (Nephrology)  History of Present Illness: Darlene [name] is a 72 y.o. female with past medical history significant for CHF, chronic kidney disease, diabetes, hyperlipidemia, hypertension, obstructive sleep apnea (not currently using his CPAP) and most significant for metastatic breast cancer who is referred for potential Y90 radioembolization.  The patient has received multiple rounds of different types of chemotherapy, however most recent CT scan of the chest, abdomen and pelvis performed 03/23/2022 demonstrated progression of hepatic metastatic disease as well as bulky porta hepatis and retroperitoneal lymphadenopathy, with associated elevation of CEA level.  As such, she is beginning a new regimen of carboplatin and gemcitabine later today.    As her disease appears primarily hepatic dominant, she has been referred for potential Y90 radioembolization.  From a functional status, patient remains independent with all activities of daily living.  She reports fluctuating levels of fatigue, currently improved.  She denies unintentional weight loss or weight gain.  No change in appetite.  No increased abdominal girth.  No yellowing of the skin or eyes.  She understands that her prognosis remains guarded though is interested in pursuing all potential available treatment options.   Past Medical History:  Diagnosis Date   Anemia    hx of - during 1st round chemo   Anxiety    Arthritis    in fingers, shoulders   Back pain    Balance problem    Breast cancer ([place])    Breast cancer of upper-outer quadrant of right female breast ([place]) 07/22/2014   Bunion, right foot    CHF (congestive heart failure) ([facility])    Chronic kidney disease    creatininei levels high per pt    Clumsiness    Constipation    Depression    Diabetes mellitus without complication ([place])    123456 years type 2     Diabetic retinopathy ([place])    torn retina   Eating disorder    binge eating   Epistaxis 08/26/2014   Fatty liver    Foot drop, left foot    HCAP (healthcare-associated pneumonia) 12/18/2014   Heart murmur    never had any problems   History of radiation therapy 04/05/15-05/19/15   right chest wall was treated to 50.4 Gy in 28 fractions, right mastectomy scar was treated to 10 Gy in 5 fractions   Hyperlipidemia    Hypertension    Joint pain    Multiple food allergies    Neuromuscular disorder ([place])    diabetic neuropathy   Obesity    Obstructive sleep apnea on CPAP    does not use cpap all the time has lost 160 lbs    Ovarian cyst rupture    possible   Pneumonia    Pneumonia 11/2014   Radiation 04/05/15-05/19/15   right chest wall 50.4 Gy, mastectomy scar 10 Gy   Sleep apnea    Thyroid nodule 06/2014    Past Surgical History:  Procedure Laterality Date   ANAL RECTAL MANOMETRY N/A 08/01/2021   Procedure: ANO RECTAL MANOMETRY;  Surgeon: Thornton Park, MD;  Location: [facility];  Service: Gastroenterology;  Laterality: N/A;   APPENDECTOMY     BIOPSY THYROID Bilateral 06/2014   2 nodules - Benign    BREAST LUMPECTOMY WITH RADIOACTIVE SEED AND SENTINEL LYMPH NODE BIOPSY Right 12/27/2014   Procedure: BREAST LUMPECTOMY WITH RADIOACTIVE SEED AND SENTINEL LYMPH NODE BIOPSY;  Surgeon: Stark Klein, MD;  Location: [facility];  Service: General;  Laterality: Right;   BREAST REDUCTION SURGERY Bilateral 01/06/2015   Procedure: BILATERAL BREAST REDUCTION, RIGHT ONCOPLASTIC RECONSTRUCTION),LEFT BREAST REDUCTION FOR SYMMETRY;  Surgeon: [name], MD;  Location: [facility];  Service: Plastics;  Laterality: Bilateral;   BREATH TEK H PYLORI N/A 09/15/2012   Procedure: BREATH TEK H PYLORI;  Surgeon: Pedro Earls, MD;  Location: [facility];  Service: General;  Laterality: N/A;   BUNIONECTOMY Left 12/2013   CARPAL TUNNEL RELEASE Right    CARPAL TUNNEL RELEASE Left    CATARACT  EXTRACTION     6/19   COLONOSCOPY     DUPUYTREN CONTRACTURE RELEASE     GASTRIC ROUX-EN-Y N/A 11/02/2012   Procedure: LAPAROSCOPIC ROUX-EN-Y GASTRIC;  Surgeon: Pedro Earls, MD;  [facility];  Service: General;  Laterality: N/A;   IR GENERIC HISTORICAL  03/18/2016   IR US GUIDE VASC ACCESS LEFT 03/18/2016 Markus Daft, MD [facility]   IR GENERIC HISTORICAL  03/18/2016   IR FLUORO GUIDE CV LINE LEFT 03/18/2016 Markus Daft, MD [facility]   LAPAROSCOPIC APPENDECTOMY N/A 07/22/2015   Procedure: APPENDECTOMY LAPAROSCOPIC;  Surgeon: Michael Boston, MD;  [facility];  Service: General;  Laterality: N/A;   MASTECTOMY Right 02/15/2015   modified   MODIFIED MASTECTOMY Right 02/15/2015   Procedure: RIGHT MODIFIED RADICAL MASTECTOMY;  Surgeon: Stark Klein, MD;  Location: [facility];  Service: General;  Laterality: Right;   ORIF WRIST FRACTURE Left 05/11/2019   Procedure: OPEN REDUCTION INTERNAL FIXATION (ORIF) left distal radius fracture;  Surgeon: Renette Butters, MD;  Location: [facility];  Service: Orthopedics;  Laterality: Left;  with block   PORT-A-CATH REMOVAL Left 10/10/2015   Procedure: PORT REMOVAL;  Surgeon: Stark Klein, MD;  Location: [facility];  Service: General;  Laterality: Left;   PORTACATH PLACEMENT Left 08/02/2014   Procedure: INSERTION PORT-A-CATH;  Surgeon: Stark Klein, MD;  Location: [facility];  Service: General;  Laterality: Left;   PORTACATH PLACEMENT N/A 11/05/2016   Procedure: INSERTION PORT-A-CATH;  Surgeon: Stark Klein, MD;  Location: [facility];  Service: General;  Laterality: N/A;   RETINAL LASER PROCEDURE     retina tear on left eye   ROTATOR CUFF REPAIR Right 2005   SCAR REVISION Right 12/08/2015   Procedure: COMPLEX REPAIR RIGHT CHEST 10-15CM;  Surgeon: [name], MD;  Location: [facility];  Service: Plastics;  Laterality: Right;   TOE SURGERY  1970s   bone spur   TRIGGER FINGER RELEASE     x3    Allergies: Antihistamines,  diphenhydramine-type; Nsaids; Peanut allergen powder-dnfp; and Tape  Medications: Prior to Admission medications   Medication Sig Start Date End Date Taking? Authorizing Provider  Accu-Chek Softclix Lancets lancets 1 each by Other route 4 (four) times daily. Test blood sugars 4 x times daily 05/08/21   Dennard Nip D, MD  aspirin EC 81 MG tablet Take 1 tablet (81 mg total) by mouth daily. 03/18/19   Larey Dresser, MD  Bempedoic Acid-Ezetimibe (NEXLIZET) 180-10 MG TABS Take 1 tablet by mouth daily. 08/08/21   Larey Dresser, MD  Blood Glucose Calibration (ACCU-CHEK GUIDE CONTROL) LIQD 1 Bottle by In Vitro route as needed. 04/15/19   Dennard Nip D, MD  Blood Glucose Monitoring Suppl (ACCU-CHEK GUIDE) w/Device KIT 1 each by Does not apply route 4 (four) times daily. Use to test blood sugars 4x a day 03/08/19   Dennard Nip D, MD  buPROPion (  WELLBUTRIN SR) 200 MG 12 hr tablet Take one tablet by mouth once daily. 02/26/22   Dennard Nip D, MD  CALCIUM CITRATE-VITAMIN D PO Take 1 tablet by mouth 3 (three) times daily.     [provider]  carvedilol (COREG) 6.25 MG tablet Take 1 tablet (6.25 mg total) by mouth 2 (two) times daily. 11/02/21   Larey Dresser, MD  cholecalciferol (VITAMIN D) 1000 units tablet Take 1,000 Units by mouth daily.    [provider]  Cyanocobalamin (VITAMIN B-12 PO) Take 1 tablet by mouth every 14 (fourteen) days.    [provider]  glucose blood (ACCU-CHEK GUIDE) test strip Use Accu Chek test strips to check blood sugar 4 times daily. DX:E11.21 02/06/22   Nicholas Lose, MD  insulin aspart (NOVOLOG) 100 UNIT/ML injection Inject into the skin daily as needed for high blood sugar. Per patient, sliding scale with meals as needed    [provider]  insulin glargine (LANTUS SOLOSTAR) 100 UNIT/ML Solostar Pen Inject 8 Units into the skin daily. 10/05/20   Starlyn Skeans, MD  KERENDIA 20 MG TABS TAKE 1 TABLET EVERY DAY 01/22/22   Larey Dresser, MD  Lactobacillus ([employer] ACIDOPHILUS PROBIOTIC) 0.5 MG TABS Take 1 tablet by mouth daily. 10/19/21   Nicholas Lose, MD  loperamide (IMODIUM) 2 MG capsule Take 2 mg by mouth as needed for diarrhea or loose stools.    [provider]  melatonin 5 MG TABS Take 5 mg by mouth at bedtime.    [provider]  Multiple Vitamins-Minerals (OPURITY) TABS Take by mouth.    [provider]  ondansetron (ZOFRAN) 8 MG tablet Take 1 tablet (8 mg total) by mouth every 8 (eight) hours as needed for nausea or vomiting. 03/21/22   Nicholas Lose, MD  polyethylene glycol (MIRALAX / GLYCOLAX) 17 g packet Take 17 g by mouth daily.    [provider]  prochlorperazine (COMPAZINE) 10 MG tablet Take 1 tablet (10 mg total) by mouth every 6 (six) hours as needed (Nausea or vomiting). 03/21/22   Nicholas Lose, MD  Propylene Glycol (SYSTANE BALANCE OP) Place 1-2 drops into both eyes 3 (three) times daily as needed (for dry eyes).     [provider]  SURE COMFORT PEN NEEDLES 32G X 4 MM MISC Use as instructed to inject insulin and Victoza daily. 09/21/20   Dennard Nip D, MD  tobramycin (TOBREX) 0.3 % ophthalmic ointment Place 1 application into both eyes as needed (Takes day before eye injections).    [provider]  vortioxetine HBr (TRINTELLIX) 20 MG TABS tablet Take 1 tablet (20 mg total) by mouth at bedtime. 02/26/22   Starlyn Skeans, MD     Family History  Problem Relation Age of Onset   CVA Mother    Heart disease Mother    Sudden death Mother    Diabetes Father    Heart failure Father    Hypertension Father    Kidney disease Father    Obesity Father    Hypertension Sister    Diabetes Brother    Breast cancer Paternal Grandmother 5   Diabetes Paternal Uncle    Ovarian cancer Neg Hx     Social History   Socioeconomic History   Marital status: Married    Spouse name: [name]   Number of children: 0   Years of education: Bachelor's   Highest  education level: Bachelor's degree (e.g., BA, AB, BS)  Occupational History  Occupation: Pensions consultant: [employer]  Tobacco Use   Smoking status: Never   Smokeless tobacco: Never  Vaping Use   Vaping Use: Never used  Substance and Sexual Activity   Alcohol use: Yes    Alcohol/week: 1.0 - 2.0 standard drink of alcohol    Types: 1 - 2 Standard drinks or equivalent per week    Comment: social   Drug use: No   Sexual activity: Yes    Partners: Male    Birth control/protection: Post-menopausal, Other-see comments    Comment: husband vasectomy  Other Topics Concern   Not on file  Social History Narrative   Patient is married Health and safety inspector) and lives at home with her husband.   Patient does not have any children.   Patient is working for a [employer].   Patient has a Bachelor's degree.   Patient is right-handed.   Patient does not drink any caffeine.      Right breast removed   Social Determinants of Health   Financial Resource Strain: Low Risk  (05/19/2021)   Overall Financial Resource Strain (CARDIA)    Difficulty of Paying Living Expenses: Not hard at all  Food Insecurity: No Food Insecurity (05/19/2021)   Hunger Vital Sign    Worried About Running Out of Food in the Last Year: Never true    Ran Out of Food in the Last Year: Never true  Transportation Needs: No Transportation Needs (05/19/2021)   PRAPARE - Hydrologist (Medical): No    Lack of Transportation (Non-Medical): No  Physical Activity: Unknown (05/19/2021)   Exercise Vital Sign    Days of Exercise per Week: 1 day    Minutes of Exercise per Session: Not on file  Stress: No Stress Concern Present (05/19/2021)   [place]    Feeling of Stress : Not at all  Social Connections: [place] (05/19/2021)   Social Connection and Isolation Panel [NHANES]    Frequency of Communication with  Friends and Family: Three times a week    Frequency of Social Gatherings with Friends and Family: Twice a week    Attends Religious Services: 1 to 4 times per year    Active Member of [employer] or Organizations: Yes    Attends Music therapist: More than 4 times per year    Marital Status: Married    ECOG Status: 1 - Symptomatic but completely ambulatory  Review of Systems  Review of Systems: A 12 point ROS discussed and pertinent positives are indicated in the HPI above.  All other systems are negative.   Physical Exam No direct physical exam was performed (except for noted visual exam findings with Video Visits).   Vital Signs: LMP 10/22/2007   Imaging:  Personal review of CT scan of the chest, abdomen and pelvis performed 03/23/2022 demonstrates multiple ill-defined nearly confluent hypoattenuating lesions involving the majority of the right lobe of the liver with dominant hypoattenuating mass measuring at least 13.1 x 8.4 x 5.4 cm (coronal image 72, series 5; axial image 57, series 2) with associated bulky porta hepatis and retroperitoneal lymphadenopathy, morphologically similar to slightly progressed compared to the 01/17/2022 examination though it definitively progressed compared to the 08/20/2021 examination (though this examination was a noncontrast exam.    Additionally, review of CT scan performed 03/23/2022 demonstrates development of several new pulmonary nodules within the right upper and lower lobes each measuring 0.8 cm  in diameter (right upper lobe-axial image 47, series 4; right lower lobe-image 78, series 4), worrisome for development of pulmonary metastatic metastatic disease.  CT CHEST ABDOMEN PELVIS W CONTRAST  Result Date: 03/25/2022 CLINICAL DATA:  72 year old female with history of stage IV breast cancer. Evaluate response to treatment. * Tracking Code: BO * EXAM: CT CHEST, ABDOMEN, AND PELVIS WITH CONTRAST TECHNIQUE: Multidetector CT imaging of the chest,  abdomen and pelvis was performed following the standard protocol during bolus administration of intravenous contrast. RADIATION DOSE REDUCTION: This exam was performed according to the departmental dose-optimization program which includes automated exposure control, adjustment of the mA and/or kV according to patient size and/or use of iterative reconstruction technique. CONTRAST:  45m OMNIPAQUE IOHEXOL 300 MG/ML  SOLN COMPARISON:  CT of the chest, abdomen and pelvis 01/17/2022. FINDINGS: CT CHEST FINDINGS Cardiovascular: Heart size is normal. There is no significant pericardial fluid, thickening or pericardial calcification. There is aortic atherosclerosis, as well as atherosclerosis of the great vessels of the mediastinum and the coronary arteries, including calcified atherosclerotic plaque in the left main, left anterior descending, left circumflex and right coronary arteries. Severe calcifications of the mitral annulus. Left-sided single-lumen subclavian Port-A-Cath with tip terminating in the distal superior vena cava. Mediastinum/Nodes: Interval enlargement of an anterior mediastinal lymph node just above the diaphragm (axial image 40 of series 2) which currently measures 11 mm (previously only 7 mm), suspicious for metastatic lymph node. No other pathologically enlarged mediastinal, hilar or internal mammary lymph nodes are identified at this time. Esophagus is unremarkable in appearance. No axillary lymphadenopathy. Surgical clips in the right axilla from prior lymph node dissection. Lungs/Pleura: Interval development of numerous pulmonary nodules, concerning for pulmonary metastases in the lungs bilaterally. Specific examples include a 9 x 6 mm left lower lobe pulmonary nodule (axial image 124 of series 4), a 8 x 5 mm left lower lobe pulmonary nodule (axial image 106 of series 4), a 1.2 x 0.4 cm right lower lobe pulmonary nodule (axial image 77 of series 4), and a 7 x 5 mm right upper lobe pulmonary nodule  (axial image 47 of series 4), although there are numerous other pulmonary nodules noted elsewhere in the lungs. No acute consolidative airspace disease. No pleural effusions. Musculoskeletal: Status post right modified radical mastectomy and right axillary lymph node dissection. Multiple healing fractures are noted of the anterior left third, fourth, fifth and sixth ribs, new compared to the prior study, but nonacute. There are no aggressive appearing lytic or blastic lesions noted in the visualized portions of the skeleton. CT ABDOMEN PELVIS FINDINGS Hepatobiliary: Again noted are innumerable poorly defined hypovascular areas in the liver indicative of widespread metastatic disease to the liver. The largest conglomeration of lesions centered in the right lobe of the liver is very heterogeneous in appearance on today's examination, currently measuring 14.1 x 6.2 x 15.9 cm (axial image 48 of series 2 and coronal image 80 of series 5). The anterior portion of this conglomeration is more intermediate to high attenuation than previously seen, while the inferior portion of this conglomeration is more well-defined and low-attenuation when compared to the prior study. Overall, the burden of metastatic disease throughout the liver appears slightly increased, with increased number of small lesions, particularly evident in the left lobe of the liver and the superior aspect of the right lobe of the liver. Numerous calcified gallstones lie dependently in the gallbladder. No pericholecystic fluid or surrounding inflammatory changes. Pancreas: No pancreatic mass. No pancreatic ductal dilatation. No pancreatic  or peripancreatic fluid collections or inflammatory changes. Spleen: Unremarkable. Adrenals/Urinary Tract: 8 mm nonobstructive calculus in the lower pole collecting system of the left kidney. Bilateral kidneys and right adrenal gland are otherwise normal in appearance. Nodular thickening of the left adrenal gland again noted  measuring up to 2.4 x 1.3 cm (axial image 55 of series 2), previously 1.8 x 1.3 cm). No hydroureteronephrosis. Urinary bladder is unremarkable in appearance. Stomach/Bowel: Postoperative changes of Roux-en-Y gastric bypass. No pathologic dilatation of small bowel or colon. A few scattered colonic diverticuli are noted, without definite focal surrounding inflammatory changes to indicate an acute diverticulitis at this time. The appendix is not confidently identified and may be surgically absent. Regardless, there are no inflammatory changes noted adjacent to the cecum to suggest the presence of an acute appendicitis at this time. Vascular/Lymphatic: Aortic atherosclerosis, without evidence of aneurysm or dissection in the abdominal or pelvic vasculature. Increasing upper abdominal and retroperitoneal lymphadenopathy indicative of widespread metastatic disease. Specific examples include an enlarged retrocaval lymph node (axial image 68 of series 2) measuring 2.2 cm in short axis (previously 1.7 cm), and an enlarged left para-aortic lymph node adjacent to the left renal hilum (axial image 68 of series 2) measuring 1.9 cm in short axis (previously only 1.3 cm). Portacaval nodal mass measuring 3.3 x 5.4 cm (previously only 2.3 x 4.5 cm). Hepatoduodenal ligament lymph node is similar measuring 3.3 cm in short axis (previously 2.9 cm). Reproductive: Uterus and ovaries are atrophic. Other: Trace volume of ascites.  No pneumoperitoneum. Musculoskeletal: There are no aggressive appearing lytic or blastic lesions noted in the visualized portions of the skeleton. IMPRESSION: 1. Further progression of metastatic disease, as detailed above, with increased number and size of metastatic lesions throughout the liver, progressive upper abdominal and retroperitoneal lymphadenopathy, developing anterior mediastinal lymphadenopathy, and numerous new pulmonary nodules. 2. Small volume of ascites. 3. Cholelithiasis without evidence of  acute cholecystitis. 4. Colonic diverticulosis without evidence of acute diverticulitis at this time. 5. Aortic atherosclerosis, in addition to left main and three-vessel coronary artery disease. 6. There are calcifications of the mitral annulus. Echocardiographic correlation for evaluation of potential valvular dysfunction may be warranted if clinically indicated. 7. Additional incidental findings, as above. Electronically Signed   By: Vinnie Langton M.D.   On: 03/25/2022 08:03   MR Brain W Wo Contrast  Result Date: 03/08/2022 CLINICAL DATA:  Breast cancer, invasive, stage IV, initial workup EXAM: MRI HEAD WITHOUT AND WITH CONTRAST TECHNIQUE: Multiplanar, multiecho pulse sequences of the brain and surrounding structures were obtained without and with intravenous contrast. CONTRAST:  72m GADAVIST GADOBUTROL 1 MMOL/ML IV SOLN COMPARISON:  CT head April 11, 21. FINDINGS: Brain: No acute infarction, hemorrhage, hydrocephalus, extra-axial collection or mass lesion. Cerebral atrophy. Left for age chronic microvascular ischemic change. Vascular: Major arterial flow voids are maintained. Skull and upper cervical spine: Normal marrow signal. Sinuses/Orbits: Mild paranasal sinus mucosal thickening. No acute orbital findings. Other: No mastoid effusions. IMPRESSION: No evidence of acute intracranial abnormality or metastatic disease. Electronically Signed   By: FMargaretha SheffieldM.D.   On: 03/08/2022 10:35    Labs:  CBC: Recent Labs    02/25/22 1401 03/04/22 0844 03/18/22 0854 03/25/22 0851  WBC 15.2* 5.1 18.0* 5.5  HGB 10.0* 9.7* 10.1* 9.5*  HCT 29.9* 28.8* 30.3* 28.6*  PLT 233 282 164 249    COAGS: Recent Labs    08/30/21 1130  INR 1.1    BMP: Recent Labs    02/25/22 1401 03/04/22  [id number] 03/18/22 0854 03/25/22 0851  NA 135 136 138 140  K 4.9 4.9 4.6 4.8  CL 100 105 104 105  CO2 '26 25 27 26  '$ GLUCOSE 265* 257* 196* 218*  BUN 53* 35* 35* 35*  CALCIUM 9.0 9.0 8.5* 9.4  CREATININE 1.79*  1.46* 1.50* 1.78*  GFRNONAA 30* 39* 37* 31*    LIVER FUNCTION TESTS: Recent Labs    02/25/22 1401 03/04/22 0844 03/18/22 0854 03/25/22 0851  BILITOT 0.5 0.5 0.5 0.5  AST 57* 31 71* 67*  ALT '18 14 22 19  '$ ALKPHOS 182* 149* 206* 184*  PROT 6.2* 6.5 6.5 6.5  ALBUMIN 3.4* 3.4* 3.4* 3.5    TUMOR MARKERS:    Assessment and Plan:  Darlene [name] is a 72 y.o. female with past medical history significant for CHF, chronic kidney disease, diabetes, hyperlipidemia, hypertension, obstructive sleep apnea (not currently using his CPAP) and most significant for metastatic breast cancer who is referred for potential Y90 radioembolization.  Personal review of CT scan of the chest, abdomen and pelvis performed 03/23/2022 demonstrates multiple ill-defined nearly confluent hypoattenuating lesions involving the majority of the right lobe of the liver with dominant hypoattenuating mass measuring at least 13.1 x 8.4 x 5.4 cm (coronal image 72, series 5; axial image 57, series 2) with associated bulky porta hepatis and retroperitoneal lymphadenopathy, morphologically similar to slightly progressed compared to the 01/17/2022 examination though it definitively progressed compared to the 08/20/2021 examination (though this examination was performed without intravenous contrast presumably due to chronic renal insufficiency).  The portal vein remains patent.  Trace amount of perihepatic ascites.  Additionally, review of CT scan performed 03/23/2022 demonstrates development of several new pulmonary nodules within the right upper and lower lobes each measuring 0.8 cm in diameter (right upper lobe-axial image 47, series 4; right lower lobe-image 78, series 4), worrisome for development of pulmonary metastatic disease.  These findings are associated with persistent upward trending of patient's CEA level (5610 on 03/18/2022; 4582 on 03/04/2022).  Fortunately, the patient's bilirubin remains within normal limits (0.5  on 03/25/2022).    She does have chronic renal insufficiency though with relatively stable creatinine level of 1.8 on 03/25/2022 and was able to receive contrast for CT scan of the abdomen pelvis performed 03/23/2022.  I explained that Y90 radioembolization is a palliative procedure that allows for radioactivity to administered to the hepatic parenchyma in hopes of stabilizing to improving hepatic disease burden for some length of time.  I explained that the procedure was performed with palliative intent.    I explained that the procedure would ONLY address her liver disease, and as such, I would initially obtain a PET/CT to evaluate of extent of hypermetabolic activity within the liver as well as to ensure hepatocentric disease distribution.  Additionally, the PET scan will be helpful for surveillance purposes following the Y90 radioembolization.  I explained that as she has bilobar disease, the Y90 radioembolization would likely entail 3 separate procedures with initial safety/mapping procedure followed by definitive radioembolization, initially of the right lobe of the liver (as that is where the patient's largest burden of disease is located), followed by radioembolization of the left lobe of the liver after recovery period of about 3 to 4 weeks.  I explained that all procedures are performed on an outpatient basis with conscious sedation at [facility].  I explained that the planning/mapping procedure is not typically associated with a prolonged recovery (short of recovery from the conscious sedation medicines as  well as the right groin access puncture site) however she could expect approximately 7 to 10 days recovery following definitive radioembolization at which time she could experience worsening fatigue and anorexia.  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  Following this prolonged and detailed conversation, while the patient understands her prognosis is guarded, she is interested in pursuing Y90 radioembolization as indicated.  Plan: - Follow-up telemedicine consultation following acquisition of PET/CT to evaluate hypermetabolic activity within the liver as well as to ensure hepatocentric disease distribution.  Thank you for this interesting consult.  I greatly enjoyed meeting Darlene [name] and look forward to participating in their care.  A copy of this report was sent to the requesting provider on this date.  Electronically Signed: Sandi Mariscal 04/02/2022, 8:27 AM   I spent a total of 30 Minutes in remote  clinical consultation, greater than 50% of which was counseling/coordinating care for metastatic breast cancer.    Visit type: Audio only (telephone). Audio (no video) only due to patient's lack of internet/smartphone capability. Alternative for in-person consultation at [facility] [address], [place], [place]. This visit type was conducted due to national recommendations for restrictions regarding the COVID-19 Pandemic (e.g. social distancing).  This format is felt to be most appropriate for this patient at this time.  All issues noted in this document were discussed and addressed.

## 2022-04-02 NOTE — Telephone Encounter (Signed)
Called patient per WQ to setup treatment/ injections. Patient aware of date and times of appointments.

## 2022-04-02 NOTE — Progress Notes (Signed)
[facility] Follow up:    Isaac Bliss, Rayford Halsted, MD [place] [address]   DIAGNOSIS:  Cancer Staging  Breast cancer of upper-outer quadrant of right female breast [facility]) Staging form: Breast, AJCC 7th Edition - Clinical: Stage IIA (T1c, N1, M0) - Signed by Chauncey Cruel, MD on 07/27/2014 - Pathologic stage from 12/29/2014: Stage IIB (yT2, N1a, cM0) - Signed by Enid Cutter, MD on 01/24/2015 Staged by: Pathologist Stage prefix: Post-therapy Laterality: Right Estrogen receptor status: Negative Progesterone receptor status: Negative HER2 status: Positive Stage used in treatment planning: Yes National guidelines used in treatment planning: Yes Type of national guideline used in treatment planning: NCCN Staging comments: Staged on surgical specimen by Dr. Avis Epley   SUMMARY OF ONCOLOGIC HISTORY: Oncology History  Breast cancer of upper-outer quadrant of right female breast ([place])  07/19/2014 Initial Biopsy   (R) breast needle biopsy (10:00): IDC, DCIS, grade 2. ER-, PR-, HER2+ (ratio 2.42). Ki67 90%.    07/22/2014 Initial Diagnosis   Breast cancer of upper-outer quadrant of right female breast   08/08/2014 - 11/21/2014 Neo-Adjuvant Chemotherapy   Taxotere/Carbo/Herceptin/Perjeta x 6 cycles.    12/12/2014 - 10/02/2015 Chemotherapy   Maintenance Herceptin (to complete 1 year of therapy).    12/27/2014 Surgery   (R) lumpectomy with SLNB [facility]): IDC, grade 2, spanning 4 cm, associated high grade DCIS. Negative margins. 1/3 SLN (+).   ER/PR repeated and remain negative.   ypT2, ypN1a: Stage IIB   01/06/2015 Surgery   Bilateral breast mammoplasty ([place]): Right breast path with intralymphatic emboli of ductal carcinoma. Left breast benign.    02/02/2015 Imaging   CT chest: No findings of metastatic disease in the chest. Right axillary fluid density lesion likely postoperative seroma or hematoma. Small left subpectoral nodes warrant  followup attention.   02/02/2015 Imaging   Bone scan: No scintigraphic evidence of osseous metastatic disease   02/16/2015 Surgery   (R) mastectomy and ALND [facility]): Scattered foci of high grade DCIS, scattered intralymphatic tumor emboli. Margins negative. 0/15 LNs.    04/05/2015 - 05/19/2015 Radiation Therapy   Adjuvant XRT ([place]). The right chest wall was treated to 50.4 Gy in 28 fractions at 1.8 Gy per fraction.  The right mastectomy scar was treated to 10 Gy in 5 fractions at 2 Gy per fraction.    12/06/2015 PET scan   Right subpectoral lymph node measuring 9 mm and exhibits malignant range FDG uptake, suspicious for metastatic adenopathy. Subcutaneous nodule within the upper left chest wall exhibits malignant range uptake, which is indeterminate; this is at the site of previous Port-A-Cath and is favored to represent postsurgical change. Indeterminate left adrenal nodules which exhibit mild uptake, favored to represent benign adenoma.   08/11/2017 - 11/03/2017 Chemotherapy   The patient had ado-trastuzumab emtansine (KADCYLA) 320 mg in sodium chloride 0.9 % 250 mL chemo infusion, 3.6 mg/kg = 320 mg, Intravenous, Once, 4 of 5 cycles Administration: 320 mg (08/12/2017), 320 mg (09/01/2017), 320 mg (09/23/2017)  for chemotherapy treatment.    11/18/2017 - 08/30/2020 Chemotherapy   The patient had trastuzumab (HERCEPTIN) 546 mg in sodium chloride 0.9 % 250 mL chemo infusion, 6 mg/kg = 546 mg (100 % of original dose 6 mg/kg), Intravenous,  Once, 2 of 2 cycles Dose modification: 6 mg/kg (original dose 6 mg/kg, Cycle 1, Reason: Provider Judgment) Administration: 546 mg (11/18/2017), 546 mg (12/16/2017) pertuzumab (PERJETA) 420 mg in sodium chloride 0.9 % 250 mL chemo infusion, 420 mg (100 %  of original dose 420 mg), Intravenous, Once, 15 of 15 cycles Dose modification: 420 mg (original dose 420 mg, Cycle 18), 420 mg (original dose 420 mg, Cycle 27) Administration: 420 mg (10/06/2018), 420 mg  (10/27/2018), 420 mg (11/18/2018), 420 mg (12/08/2018), 420 mg (12/29/2018), 420 mg (01/19/2019), 420 mg (02/16/2019), 420 mg (03/16/2019), 420 mg (04/13/2019), 420 mg (06/08/2019), 420 mg (07/06/2019), 420 mg (08/03/2019), 420 mg (08/31/2019), 420 mg (09/28/2019), 420 mg (05/19/2019) trastuzumab-anns (KANJINTI) 546 mg in sodium chloride 0.9 % 250 mL chemo infusion, 6 mg/kg = 546 mg (100 % of original dose 6 mg/kg), Intravenous,  Once, 30 of 30 cycles Dose modification: 6 mg/kg (original dose 6 mg/kg, Cycle 3, Reason: Other (see comments), Comment: requested step therapy by [facility] insurance), 4 mg/kg (original dose 6 mg/kg, Cycle 9, Reason: Provider Judgment), 6 mg/kg (original dose 6 mg/kg, Cycle 17, Reason: Provider Judgment), 4 mg/kg (original dose 6 mg/kg, Cycle 27, Reason: Provider Judgment), 6 mg/kg (original dose 6 mg/kg, Cycle 27, Reason: Provider Judgment) Administration: 546 mg (01/13/2018), 546 mg (02/10/2018), 546 mg (03/03/2018), 546 mg (03/24/2018), 546 mg (04/14/2018), 546 mg (05/05/2018), 357 mg (05/26/2018), 357 mg (06/09/2018), 357 mg (06/23/2018), 357 mg (07/07/2018), 357 mg (07/21/2018), 357 mg (08/04/2018), 357 mg (08/18/2018), 357 mg (09/01/2018), 546 mg (09/15/2018), 546 mg (10/06/2018), 546 mg (10/27/2018), 546 mg (11/18/2018), 546 mg (12/08/2018), 546 mg (12/29/2018), 546 mg (01/19/2019), 546 mg (02/16/2019), 480 mg (03/16/2019), 483 mg (04/13/2019), 450 mg (06/08/2019), 450 mg (07/06/2019), 450 mg (08/03/2019), 450 mg (08/31/2019), 450 mg (09/28/2019)  for chemotherapy treatment.    12/31/2017 - 09/01/2018 Chemotherapy   The patient had PACLitaxel-protein bound (ABRAXANE) chemo infusion 175 mg, 80 mg/m2 = 175 mg (100 % of original dose 80 mg/m2), Intravenous,  Once, 5 of 10 cycles Dose modification: 80 mg/m2 (original dose 80 mg/m2, Cycle 11) Administration: 175 mg (07/07/2018), 175 mg (07/21/2018), 175 mg (08/04/2018), 175 mg (08/18/2018), 175 mg (09/01/2018) PACLitaxel-protein bound (ABRAXANE) chemo infusion 200 mg, 100 mg/m2 =  200 mg, Intravenous, Once, 10 of 10 cycles Dose modification: 80 mg/m2 (original dose 100 mg/m2, Cycle 2, Reason: Provider Judgment), 80 mg/m2 (original dose 100 mg/m2, Cycle 2, Reason: Provider Judgment) Administration: 200 mg (12/31/2017), 200 mg (01/06/2018), 175 mg (01/20/2018), 175 mg (01/27/2018), 175 mg (02/10/2018), 175 mg (03/03/2018), 175 mg (03/10/2018), 175 mg (03/24/2018), 175 mg (04/07/2018), 175 mg (04/14/2018), 175 mg (04/28/2018), 175 mg (05/05/2018), 175 mg (05/26/2018), 175 mg (06/09/2018), 175 mg (05/19/2018)  for chemotherapy treatment.    10/05/2020 - 01/18/2021 Chemotherapy   Patient is on Treatment Plan : BREAST METASTATIC fam-trastuzumab deruxtecan-nxki (Enhertu) q21d     02/19/2021 - 05/14/2021 Chemotherapy   Patient is on Treatment Plan : BREAST Trastuzumab q21d x 13 cycles     06/20/2021 - 08/01/2021 Chemotherapy   Patient is on Treatment Plan : BREAST METASTATIC fam-trastuzumab deruxtecan-nxki (Enhertu) q21d     07/25/2021 - 10/19/2021 Chemotherapy   Patient is on Treatment Plan : BREAST METASTATIC Sacituzumab govitecan-hziy Ivette Loyal) D1,8 q21d     08/31/2021 - 09/21/2021 Chemotherapy   Patient is on Treatment Plan : BREAST METASTATIC Sacituzumab govitecan-hziy Ivette Loyal) q21d     01/30/2022 - 03/18/2022 Chemotherapy   Patient is on Treatment Plan : BREAST METASTATIC Eribulin D1,8 q21d     04/02/2022 -  Chemotherapy   Patient is on Treatment Plan : BREAST Carboplatin + Gemcitabine D1,8 q21d     Metastasis to liver ([place])  08/04/2017 Initial Diagnosis   Metastasis to liver ([place])   08/12/2017 - 10/14/2017 Chemotherapy  The patient had ado-trastuzumab emtansine (KADCYLA) 320 mg in sodium chloride 0.9 % 250 mL chemo infusion, 3.6 mg/kg = 320 mg, Intravenous, Once, 4 of 5 cycles Administration: 320 mg (08/12/2017), 320 mg (09/01/2017), 320 mg (09/23/2017)  for chemotherapy treatment.    11/18/2017 - 08/30/2020 Chemotherapy   The patient had trastuzumab (HERCEPTIN) 546 mg in sodium chloride 0.9  % 250 mL chemo infusion, 6 mg/kg = 546 mg (100 % of original dose 6 mg/kg), Intravenous,  Once, 2 of 2 cycles Dose modification: 6 mg/kg (original dose 6 mg/kg, Cycle 1, Reason: Provider Judgment) Administration: 546 mg (11/18/2017), 546 mg (12/16/2017) pertuzumab (PERJETA) 420 mg in sodium chloride 0.9 % 250 mL chemo infusion, 420 mg (100 % of original dose 420 mg), Intravenous, Once, 15 of 15 cycles Dose modification: 420 mg (original dose 420 mg, Cycle 18), 420 mg (original dose 420 mg, Cycle 27) Administration: 420 mg (10/06/2018), 420 mg (10/27/2018), 420 mg (11/18/2018), 420 mg (12/08/2018), 420 mg (12/29/2018), 420 mg (01/19/2019), 420 mg (02/16/2019), 420 mg (03/16/2019), 420 mg (04/13/2019), 420 mg (06/08/2019), 420 mg (07/06/2019), 420 mg (08/03/2019), 420 mg (08/31/2019), 420 mg (09/28/2019), 420 mg (05/19/2019) trastuzumab-anns (KANJINTI) 546 mg in sodium chloride 0.9 % 250 mL chemo infusion, 6 mg/kg = 546 mg (100 % of original dose 6 mg/kg), Intravenous,  Once, 30 of 30 cycles Dose modification: 6 mg/kg (original dose 6 mg/kg, Cycle 3, Reason: Other (see comments), Comment: requested step therapy by [facility] insurance), 4 mg/kg (original dose 6 mg/kg, Cycle 9, Reason: Provider Judgment), 6 mg/kg (original dose 6 mg/kg, Cycle 17, Reason: Provider Judgment), 4 mg/kg (original dose 6 mg/kg, Cycle 27, Reason: Provider Judgment), 6 mg/kg (original dose 6 mg/kg, Cycle 27, Reason: Provider Judgment) Administration: 546 mg (01/13/2018), 546 mg (02/10/2018), 546 mg (03/03/2018), 546 mg (03/24/2018), 546 mg (04/14/2018), 546 mg (05/05/2018), 357 mg (05/26/2018), 357 mg (06/09/2018), 357 mg (06/23/2018), 357 mg (07/07/2018), 357 mg (07/21/2018), 357 mg (08/04/2018), 357 mg (08/18/2018), 357 mg (09/01/2018), 546 mg (09/15/2018), 546 mg (10/06/2018), 546 mg (10/27/2018), 546 mg (11/18/2018), 546 mg (12/08/2018), 546 mg (12/29/2018), 546 mg (01/19/2019), 546 mg (02/16/2019), 480 mg (03/16/2019), 483 mg (04/13/2019), 450 mg (06/08/2019), 450 mg  (07/06/2019), 450 mg (08/03/2019), 450 mg (08/31/2019), 450 mg (09/28/2019)  for chemotherapy treatment.    01/30/2022 - 03/18/2022 Chemotherapy   Patient is on Treatment Plan : BREAST METASTATIC Eribulin D1,8 q21d     04/02/2022 -  Chemotherapy   Patient is on Treatment Plan : BREAST Carboplatin + Gemcitabine D1,8 q21d       CURRENT THERAPY: Carbo Gemzar  INTERVAL HISTORY: Seletha Lindo 72 y.o. female returns for follow-up and evaluation prior to receiving carboplatin and gemcitabine.  She underwent restaging CT chest abdomen pelvis that was compatible with progression.  She met with Dr. Lindi Adie who recommended changing to this chemotherapy as well as Y90 treatment with interventional radiology.  She has a PET scan set up for April 19, 2022.  Overall she is feeling well other than fullness in her abdomen where her liver metastasis is located.   Patient Active Problem List   Diagnosis Date Noted   Sarcopenia 02/26/2022   BMI 25.0-25.9,adult 02/26/2022   Obesity, Beginning BMI 33.23 02/26/2022   Diabetes mellitus ([place]) 11/01/2021   Class 1 obesity with serious comorbidity and body mass index (BMI) of 33.0 to 33.9 in adult 11/01/2021   Hereditary and idiopathic neuropathy, unspecified 09/20/2021   Proteinuria 09/20/2021   Constipation due to outlet dysfunction    Stress  07/31/2021   Abnormal findings on diagnostic imaging of other parts of digestive tract 02/09/2021   Allergic rhinitis 02/09/2021   Arthritis of right knee 02/09/2021   Diverticular disease of colon 02/09/2021   Gallstones 02/09/2021   Hyperglycemia due to type 2 diabetes mellitus ([place]) 02/09/2021   Neuropathy 02/09/2021   Pure hypercholesterolemia 02/09/2021   Shoulder joint pain 02/09/2021   Thyroid nodule 02/09/2021   Vitamin B12 deficiency 02/09/2021   CKD (chronic kidney disease) stage 4, GFR 15-29 ml/min (HCC) 09/27/2020   Hammer toe of second toe of right foot 11/16/2018   Hallux valgus of right foot  11/16/2018   Essential hypertension 11/04/2018   Pain due to onychomycosis of toenails of both feet 10/14/2018   Pre-ulcerative calluses 10/14/2018   Diabetic retinal edema ([place]) 07/19/2018   Diabetic retinopathy of both eyes associated with type 2 diabetes mellitus ([place]) 06/28/2018   Goals of care, counseling/discussion 12/30/2017   Occult malignancy ([place]) 12/01/2017   Neoplastic malignant related fatigue 09/29/2017   Metastasis to liver ([place]) 08/04/2017   Lesion of liver 07/30/2017   Type 2 diabetes mellitus without complication, with long-term current use of insulin ([place]) 03/11/2017   Vitamin D deficiency 02/24/2017   Aortic atherosclerosis ([place]) 01/28/2017   Type 2 diabetes mellitus without complication, without long-term current use of insulin ([place]) 12/18/2016   Class 1 obesity with serious comorbidity and body mass index (BMI) of 30.0 to 30.9 in adult 12/18/2016   Port catheter in place 08/21/2015   Acute appendicitis 07/22/2015   Appendicitis with peritonitis 07/22/2015   Acquired absence of right breast and nipple 07/05/2015   History of therapeutic radiation 07/05/2015   Recurrent breast cancer, right ([place]) 02/15/2015   Chemotherapy-induced peripheral neuropathy ([place]) 02/08/2015   Macromastia 01/06/2015   Anemia in neoplastic disease 12/21/2014   Cough    Breast cancer of upper-outer quadrant of right female breast ([place]) 07/22/2014   OSA on CPAP 06/02/2013   Bariatric surgery status 06/02/2013   Type 2 diabetes mellitus with diabetic neuropathy, with long-term current use of insulin ([place]) 06/02/2013   Lap Roux Y Gastric Bypass Oct 2014 11/03/2012   Depression 09/10/2012   hyperlipidemia 09/10/2012    is allergic to antihistamines, diphenhydramine-type; nsaids; peanut allergen powder-dnfp; and tape.  MEDICAL HISTORY: Past Medical History:  Diagnosis Date   Anemia    hx of - during 1st round chemo   Anxiety    Arthritis    in fingers, shoulders   Back pain     Balance problem    Breast cancer (HCC)    Breast cancer of upper-outer quadrant of right female breast ([place]) 07/22/2014   Bunion, right foot    CHF (congestive heart failure) (HCC)    Chronic kidney disease    creatininei levels high per pt    Clumsiness    Constipation    Depression    Diabetes mellitus without complication ([place])    123456 years type 2    Diabetic retinopathy ([place])    torn retina   Eating disorder    binge eating   Epistaxis 08/26/2014   Fatty liver    Foot drop, left foot    HCAP (healthcare-associated pneumonia) 12/18/2014   Heart murmur    never had any problems   History of radiation therapy 04/05/15-05/19/15   right chest wall was treated to 50.4 Gy in 28 fractions, right mastectomy scar was treated to 10 Gy in 5 fractions   Hyperlipidemia    Hypertension  Joint pain    Multiple food allergies    Neuromuscular disorder ([place])    diabetic neuropathy   Obesity    Obstructive sleep apnea on CPAP    does not use cpap all the time has lost 160 lbs    Ovarian cyst rupture    possible   Pneumonia    Pneumonia 11/2014   Radiation 04/05/15-05/19/15   right chest wall 50.4 Gy, mastectomy scar 10 Gy   Sleep apnea    Thyroid nodule 06/2014    SURGICAL HISTORY: Past Surgical History:  Procedure Laterality Date   ANAL RECTAL MANOMETRY N/A 08/01/2021   Procedure: ANO RECTAL MANOMETRY;  Surgeon: Thornton Park, MD;  Location: [facility];  Service: Gastroenterology;  Laterality: N/A;   APPENDECTOMY     BIOPSY THYROID Bilateral 06/2014   2 nodules - Benign    BREAST LUMPECTOMY WITH RADIOACTIVE SEED AND SENTINEL LYMPH NODE BIOPSY Right 12/27/2014   Procedure: BREAST LUMPECTOMY WITH RADIOACTIVE SEED AND SENTINEL LYMPH NODE BIOPSY;  Surgeon: Stark Klein, MD;  Location: [facility];  Service: General;  Laterality: Right;   BREAST REDUCTION SURGERY Bilateral 01/06/2015   Procedure: BILATERAL BREAST REDUCTION, RIGHT ONCOPLASTIC RECONSTRUCTION),LEFT BREAST  REDUCTION FOR SYMMETRY;  Surgeon: [name], MD;  Location: [facility];  Service: Plastics;  Laterality: Bilateral;   BREATH TEK H PYLORI N/A 09/15/2012   Procedure: BREATH TEK H PYLORI;  Surgeon: Pedro Earls, MD;  Location[facility];  Service: General;  Laterality: N/A;   BUNIONECTOMY Left 12/2013   CARPAL TUNNEL RELEASE Right    CARPAL TUNNEL RELEASE Left    CATARACT EXTRACTION     6/19   COLONOSCOPY     DUPUYTREN CONTRACTURE RELEASE     GASTRIC ROUX-EN-Y N/A 11/02/2012   Procedure: LAPAROSCOPIC ROUX-EN-Y GASTRIC;  Surgeon: Pedro Earls, MD;  [facility]: [facility];  Service: General;  Laterality: N/A;   IR GENERIC HISTORICAL  03/18/2016   IR US GUIDE VASC ACCESS LEFT 03/18/2016 Markus Daft, MD [facility]   IR GENERIC HISTORICAL  03/18/2016   IR FLUORO GUIDE CV LINE LEFT 03/18/2016 Markus Daft, MD [facility]   LAPAROSCOPIC APPENDECTOMY N/A 07/22/2015   Procedure: APPENDECTOMY LAPAROSCOPIC;  Surgeon: Michael Boston, MD;  [facility];  Service: General;  Laterality: N/A;   MASTECTOMY Right 02/15/2015   modified   MODIFIED MASTECTOMY Right 02/15/2015   Procedure: RIGHT MODIFIED RADICAL MASTECTOMY;  Surgeon: Stark Klein, MD;  Location: [facility];  Service: General;  Laterality: Right;   ORIF WRIST FRACTURE Left 05/11/2019   Procedure: OPEN REDUCTION INTERNAL FIXATION (ORIF) left distal radius fracture;  Surgeon: Renette Butters, MD;  Location[facility];  Service: Orthopedics;  Laterality: Left;  with block   PORT-A-CATH REMOVAL Left 10/10/2015   Procedure: PORT REMOVAL;  Surgeon: Stark Klein, MD;  Location: [facility];  Service: General;  Laterality: Left;   PORTACATH PLACEMENT Left 08/02/2014   Procedure: INSERTION PORT-A-CATH;  Surgeon: Stark Klein, MD;  Location: [facility];  Service: General;  Laterality: Left;   PORTACATH PLACEMENT N/A 11/05/2016   Procedure: INSERTION PORT-A-CATH;  Surgeon: Stark Klein, MD;  Location: [facility];  Service: General;  Laterality: N/A;    RETINAL LASER PROCEDURE     retina tear on left eye   ROTATOR CUFF REPAIR Right 2005   SCAR REVISION Right 12/08/2015   Procedure: COMPLEX REPAIR RIGHT CHEST 10-15CM;  Surgeon: [name], MD;  Location: [facility];  Service: Plastics;  Laterality: Right;  TOE SURGERY  1970s   bone spur   TRIGGER FINGER RELEASE     x3    SOCIAL HISTORY: Social History   Socioeconomic History   Marital status: Married    Spouse name: [name]   Number of children: 0   Years of education: Bachelor's   Highest education level: Bachelor's degree (e.g., BA, AB, BS)  Occupational History   Occupation: Pensions consultant: [employer]  Tobacco Use   Smoking status: Never   Smokeless tobacco: Never  Vaping Use   Vaping Use: Never used  Substance and Sexual Activity   Alcohol use: Yes    Alcohol/week: 1.0 - 2.0 standard drink of alcohol    Types: 1 - 2 Standard drinks or equivalent per week    Comment: social   Drug use: No   Sexual activity: Yes    Partners: Male    Birth control/protection: Post-menopausal, Other-see comments    Comment: husband vasectomy  Other Topics Concern   Not on file  Social History Narrative   Patient is married Health and safety inspector) and lives at home with her husband.   Patient does not have any children.   Patient is working for a [employer].   Patient has a Bachelor's degree.   Patient is right-handed.   Patient does not drink any caffeine.      Right breast removed   Social Determinants of Health   Financial Resource Strain: Low Risk  (05/19/2021)   Overall Financial Resource Strain (CARDIA)    Difficulty of Paying Living Expenses: Not hard at all  Food Insecurity: No Food Insecurity (05/19/2021)   Hunger Vital Sign    Worried About Running Out of Food in the Last Year: Never true    Ran Out of Food in the Last Year: Never true  Transportation Needs: No Transportation Needs (05/19/2021)   PRAPARE - Civil engineer, contracting (Medical): No    Lack of Transportation (Non-Medical): No  Physical Activity: Unknown (05/19/2021)   Exercise Vital Sign    Days of Exercise per Week: 1 day    Minutes of Exercise per Session: Not on file  Stress: No Stress Concern Present (05/19/2021)   [place]    Feeling of Stress : Not at all  Social Connections: [place] (05/19/2021)   Social Connection and Isolation Panel [NHANES]    Frequency of Communication with Friends and Family: Three times a week    Frequency of Social Gatherings with Friends and Family: Twice a week    Attends Religious Services: 1 to 4 times per year    Active Member of [employer] or Organizations: Yes    Attends Music therapist: More than 4 times per year    Marital Status: Married  Human resources officer Violence: Not on file    FAMILY HISTORY: Family History  Problem Relation Age of Onset   CVA Mother    Heart disease Mother    Sudden death Mother    Diabetes Father    Heart failure Father    Hypertension Father    Kidney disease Father    Obesity Father    Hypertension Sister    Diabetes Brother    Breast cancer Paternal Grandmother 75   Diabetes Paternal Uncle    Ovarian cancer Neg Hx     Review of Systems  Constitutional:  Positive for fatigue. Negative for appetite change, chills, fever and unexpected weight  change.  HENT:   Negative for hearing loss, lump/mass and trouble swallowing.   Eyes:  Negative for eye problems and icterus.  Respiratory:  Negative for chest tightness, cough and shortness of breath.   Cardiovascular:  Negative for chest pain, leg swelling and palpitations.  Gastrointestinal:  Negative for abdominal distention, abdominal pain, constipation, diarrhea, nausea and vomiting.  Endocrine: Negative for hot flashes.  Genitourinary:  Negative for difficulty urinating.   Musculoskeletal:  Negative for arthralgias.  Skin:   Negative for itching and rash.  Neurological:  Negative for dizziness, extremity weakness, headaches and numbness.  Hematological:  Negative for adenopathy. Does not bruise/bleed easily.  Psychiatric/Behavioral:  Negative for depression. The patient is not nervous/anxious.       PHYSICAL EXAMINATION  ECOG PERFORMANCE STATUS: 2 - Symptomatic, <50% confined to bed  Vitals:   04/02/22 1459  BP: 137/87  Pulse: 75  Resp: 18  Temp: (!) 97.2 F (36.2 C)  SpO2: 100%    Physical Exam Vitals reviewed: Delay in vital signs being entered by nursing.  Constitutional:      General: She is not in acute distress.    Appearance: Normal appearance. She is not toxic-appearing.  HENT:     Head: Normocephalic and atraumatic.  Eyes:     General: No scleral icterus. Cardiovascular:     Rate and Rhythm: Normal rate and regular rhythm.     Pulses: Normal pulses.     Heart sounds: Normal heart sounds.  Pulmonary:     Effort: Pulmonary effort is normal.     Breath sounds: Normal breath sounds.  Abdominal:     General: Abdomen is flat. Bowel sounds are normal. There is no distension.     Palpations: Abdomen is soft.     Tenderness: There is no abdominal tenderness.  Musculoskeletal:        General: No swelling.     Cervical back: Neck supple.  Lymphadenopathy:     Cervical: No cervical adenopathy.  Skin:    General: Skin is warm and dry.     Findings: No rash.  Neurological:     General: No focal deficit present.     Mental Status: She is alert.  Psychiatric:        Mood and Affect: Mood normal.        Behavior: Behavior normal.     LABORATORY DATA:  CBC    Component Value Date/Time   WBC 3.7 (L) 04/02/2022 1438   WBC 3.6 (L) 09/21/2021 1133   RBC 3.20 (L) 04/02/2022 1438   HGB 10.2 (L) 04/02/2022 1438   HGB 10.8 (L) 01/07/2017 0758   HCT 31.1 (L) 04/02/2022 1438   HCT 31.9 (L) 01/07/2017 0758   PLT 287 04/02/2022 1438   PLT 126 (L) 01/07/2017 0758   MCV 97.2 04/02/2022  1438   MCV 90.8 01/07/2017 0758   MCH 31.9 04/02/2022 1438   MCHC 32.8 04/02/2022 1438   RDW 15.6 (H) 04/02/2022 1438   RDW 14.8 (H) 01/07/2017 0758   LYMPHSABS 0.6 (L) 04/02/2022 1438   LYMPHSABS 1.1 01/07/2017 0758   MONOABS 0.8 04/02/2022 1438   MONOABS 0.3 01/07/2017 0758   EOSABS 0.0 04/02/2022 1438   EOSABS 0.1 01/07/2017 0758   EOSABS 0.1 08/09/2016 1229   BASOSABS 0.0 04/02/2022 1438   BASOSABS 0.0 01/07/2017 0758    CMP     Component Value Date/Time   NA 140 03/25/2022 0851   NA 145 (H) 01/04/2020 0951   NA 140  01/07/2017 0758   K 4.8 03/25/2022 0851   K 4.1 01/07/2017 0758   CL 105 03/25/2022 0851   CO2 26 03/25/2022 0851   CO2 24 01/07/2017 0758   GLUCOSE 218 (H) 03/25/2022 0851   GLUCOSE 134 01/07/2017 0758   BUN 35 (H) 03/25/2022 0851   BUN 33 (H) 01/04/2020 0951   BUN 33.6 (H) 01/07/2017 0758   CREATININE 1.78 (H) 03/25/2022 0851   CREATININE 1.5 (H) 01/07/2017 0758   CALCIUM 9.4 03/25/2022 0851   CALCIUM 9.0 04/12/2020 0923   CALCIUM 9.3 01/07/2017 0758   PROT 6.5 03/25/2022 0851   PROT 6.3 01/04/2020 0951   PROT 6.7 01/07/2017 0758   ALBUMIN 3.5 03/25/2022 0851   ALBUMIN 4.3 01/04/2020 0951   ALBUMIN 3.6 01/07/2017 0758   AST 67 (H) 03/25/2022 0851   AST 51 (H) 01/07/2017 0758   ALT 19 03/25/2022 0851   ALT 82 (H) 01/07/2017 0758   ALKPHOS 184 (H) 03/25/2022 0851   ALKPHOS 115 01/07/2017 0758   BILITOT 0.5 03/25/2022 0851   BILITOT 0.72 01/07/2017 0758   GFRNONAA 31 (L) 03/25/2022 0851   GFRNONAA 55 (L) 02/18/2013 0827   GFRAA 36 (L) 01/04/2020 0951   GFRAA 38 (L) 04/28/2018 0915   GFRAA 63 02/18/2013 0827      ASSESSMENT and THERAPY PLAN:   Breast cancer of upper-outer quadrant of right female breast ([place]) [name] is a 72 year old woman with metastatic breast cancer that has progressed recently and she is changing treatment to now begin gemcitabine and carboplatin.  We discussed the gemcitabine and carboplatin today and I reviewed with  her common side effects that she might experience.  I recommended that she call us if she have any questions or concerns related to side effects from the treatment.    I reviewed her CBC with her today which is stable and she will proceed with treatment so long as her c-Met is within parameters.  We discussed her abdominal fullness which is likely related to her hepatomegaly from her metastatic breast cancer.  In her restaging scans she has limited ascites which is also congruent with her physical exam today.  We discussed what ascites may present like if this worsens.  [name] will return in 1 week for labs, follow-up with Dr. Lindi Adie, and her next treatment.   All questions were answered. The patient knows to call the clinic with any problems, questions or concerns. We can certainly see the patient much sooner if necessary.  Total encounter time:20 minutes*in face-to-face visit time, chart review, lab review, care coordination, order entry, and documentation of the encounter time.    Wilber Bihari, NP 04/02/22 3:26 PM Medical Oncology and Hematology [facility] [place], [place] [address] Tel. [phone]    Fax. [phone]  *Total Encounter Time as defined by the Centers for Medicare and Medicaid Services includes, in addition to the face-to-face time of a patient visit (documented in the note above) non-face-to-face time: obtaining and reviewing outside history, ordering and reviewing medications, tests or procedures, care coordination (communications with other health care professionals or caregivers) and documentation in the medical record.

## 2022-04-02 NOTE — Patient Instructions (Addendum)
[name]   Discharge Instructions: Thank you for choosing [name] to provide your oncology and hematology care.   If you have a lab appointment with the [name], please go directly to the [name] and check in at the registration area.   Wear comfortable clothing and clothing appropriate for easy access to any Portacath or PICC line.   We strive to give you quality time with your provider. You may need to reschedule your appointment if you arrive late (15 or more minutes).  Arriving late affects you and other patients whose appointments are after yours.  Also, if you miss three or more appointments without notifying the office, you may be dismissed from the clinic at the provider's discretion.      For prescription refill requests, have your pharmacy contact our office and allow 72 hours for refills to be completed.    Today you received the following chemotherapy and/or immunotherapy agents: Gemcitabine (Gemzar) and Carboplatin       To help prevent nausea and vomiting after your treatment, we encourage you to take your nausea medication as directed.  BELOW ARE SYMPTOMS THAT SHOULD BE REPORTED IMMEDIATELY: *FEVER GREATER THAN 100.4 F (38 C) OR HIGHER *CHILLS OR SWEATING *NAUSEA AND VOMITING THAT IS NOT CONTROLLED WITH YOUR NAUSEA MEDICATION *UNUSUAL SHORTNESS OF BREATH *UNUSUAL BRUISING OR BLEEDING *URINARY PROBLEMS (pain or burning when urinating, or frequent urination) *BOWEL PROBLEMS (unusual diarrhea, constipation, pain near the anus) TENDERNESS IN MOUTH AND THROAT WITH OR WITHOUT PRESENCE OF ULCERS (sore throat, sores in mouth, or a toothache) UNUSUAL RASH, SWELLING OR PAIN  UNUSUAL VAGINAL DISCHARGE OR ITCHING   Items with * indicate a potential emergency and should be followed up as soon as possible or go to the Emergency Department if any problems should occur.  Please show the CHEMOTHERAPY ALERT CARD or  IMMUNOTHERAPY ALERT CARD at check-in to the Emergency Department and triage nurse.  Should you have questions after your visit or need to cancel or reschedule your appointment, please contact [name]  Dept: [phone]  and follow the prompts.  Office hours are 8:00 a.m. to 4:30 p.m. Monday - Friday. Please note that voicemails left after 4:00 p.m. may not be returned until the following business day.  We are closed weekends and major holidays. You have access to a nurse at all times for urgent questions. Please call the main number to the clinic Dept: [phone] and follow the prompts.   For any non-urgent questions, you may also contact your provider using MyChart. We now offer e-Visits for anyone 18 and older to request care online for non-urgent symptoms. For details visit [email or web address].   Also download the MyChart app! Go to the app store, search "MyChart", open the app, select [facility], and log in with your MyChart username and password.   

## 2022-04-02 NOTE — Assessment & Plan Note (Signed)
[name] is a 72 year old woman with metastatic breast cancer that has progressed recently and she is changing treatment to now begin gemcitabine and carboplatin.  We discussed the gemcitabine and carboplatin today and I reviewed with her common side effects that she might experience.  I recommended that she call us if she have any questions or concerns related to side effects from the treatment.    I reviewed her CBC with her today which is stable and she will proceed with treatment so long as her c-Met is within parameters.  We discussed her abdominal fullness which is likely related to her hepatomegaly from her metastatic breast cancer.  In her restaging scans she has limited ascites which is also congruent with her physical exam today.  We discussed what ascites may present like if this worsens.  [name] will return in 1 week for labs, follow-up with Dr. Lindi Adie, and her next treatment.

## 2022-04-02 NOTE — Progress Notes (Signed)
Patient Care Team: Isaac Bliss, Rayford Halsted, MD as PCP - General (Internal Medicine) Larey Dresser, MD as PCP - Advanced Heart Failure (Cardiology) Stark Klein, MD as Consulting Physician (General Surgery) Jacelyn Pi, MD as Consulting Physician (Endocrinology) [name], MD as Consulting Physician (Plastic Surgery) Gery Pray, MD as Consulting Physician (Radiation Oncology) Juanita Craver, MD as Consulting Physician (Gastroenterology) Alda Berthold, DO as Consulting Physician (Neurology) Starlyn Skeans, MD as Consulting Physician (Family Medicine) Sherlynn Stalls, MD as Consulting Physician (Ophthalmology) Salvadore Dom, MD as Consulting Physician (Obstetrics and Gynecology) Raina Mina, RPH-CPP (Pharmacist) Nicholas Lose, MD as Consulting Physician (Hematology and Oncology)  DIAGNOSIS:  Encounter Diagnoses  Name Primary?   Metastasis to liver (HCC) Yes   Malignant neoplasm of upper-outer quadrant of right female breast, unspecified estrogen receptor status ([place])     SUMMARY OF ONCOLOGIC HISTORY: Oncology History  Breast cancer of upper-outer quadrant of right female breast ([place])  07/19/2014 Initial Biopsy   (R) breast needle biopsy (10:00): IDC, DCIS, grade 2. ER-, PR-, HER2+ (ratio 2.42). Ki67 90%.    07/22/2014 Initial Diagnosis   Breast cancer of upper-outer quadrant of right female breast   08/08/2014 - 11/21/2014 Neo-Adjuvant Chemotherapy   Taxotere/Carbo/Herceptin/Perjeta x 6 cycles.    12/12/2014 - 10/02/2015 Chemotherapy   Maintenance Herceptin (to complete 1 year of therapy).    12/27/2014 Surgery   (R) lumpectomy with [facility]): IDC, grade 2, spanning 4 cm, associated high grade DCIS. Negative margins. 1/3 SLN (+).   ER/PR repeated and remain negative.   ypT2, ypN1a: Stage IIB   01/06/2015 Surgery   Bilateral breast mammoplasty ([place]): Right breast path with intralymphatic emboli of ductal carcinoma. Left breast benign.     02/02/2015 Imaging   CT chest: No findings of metastatic disease in the chest. Right axillary fluid density lesion likely postoperative seroma or hematoma. Small left subpectoral nodes warrant followup attention.   02/02/2015 Imaging   Bone scan: No scintigraphic evidence of osseous metastatic disease   02/16/2015 Surgery   (R) mastectomy and ALND [facility]): Scattered foci of high grade DCIS, scattered intralymphatic tumor emboli. Margins negative. 0/15 LNs.    04/05/2015 - 05/19/2015 Radiation Therapy   Adjuvant XRT ([place]). The right chest wall was treated to 50.4 Gy in 28 fractions at 1.8 Gy per fraction.  The right mastectomy scar was treated to 10 Gy in 5 fractions at 2 Gy per fraction.    12/06/2015 PET scan   Right subpectoral lymph node measuring 9 mm and exhibits malignant range FDG uptake, suspicious for metastatic adenopathy. Subcutaneous nodule within the upper left chest wall exhibits malignant range uptake, which is indeterminate; this is at the site of previous Port-A-Cath and is favored to represent postsurgical change. Indeterminate left adrenal nodules which exhibit mild uptake, favored to represent benign adenoma.   08/11/2017 - 11/03/2017 Chemotherapy   The patient had ado-trastuzumab emtansine (KADCYLA) 320 mg in sodium chloride 0.9 % 250 mL chemo infusion, 3.6 mg/kg = 320 mg, Intravenous, Once, 4 of 5 cycles Administration: 320 mg (08/12/2017), 320 mg (09/01/2017), 320 mg (09/23/2017)  for chemotherapy treatment.    11/18/2017 - 08/30/2020 Chemotherapy   The patient had trastuzumab (HERCEPTIN) 546 mg in sodium chloride 0.9 % 250 mL chemo infusion, 6 mg/kg = 546 mg (100 % of original dose 6 mg/kg), Intravenous,  Once, 2 of 2 cycles Dose modification: 6 mg/kg (original dose 6 mg/kg, Cycle 1, Reason: Provider Judgment) Administration: 546 mg (11/18/2017), 546 mg (12/16/2017)  pertuzumab (PERJETA) 420 mg in sodium chloride 0.9 % 250 mL chemo infusion, 420 mg (100 % of original dose  420 mg), Intravenous, Once, 15 of 15 cycles Dose modification: 420 mg (original dose 420 mg, Cycle 18), 420 mg (original dose 420 mg, Cycle 27) Administration: 420 mg (10/06/2018), 420 mg (10/27/2018), 420 mg (11/18/2018), 420 mg (12/08/2018), 420 mg (12/29/2018), 420 mg (01/19/2019), 420 mg (02/16/2019), 420 mg (03/16/2019), 420 mg (04/13/2019), 420 mg (06/08/2019), 420 mg (07/06/2019), 420 mg (08/03/2019), 420 mg (08/31/2019), 420 mg (09/28/2019), 420 mg (05/19/2019) trastuzumab-anns (KANJINTI) 546 mg in sodium chloride 0.9 % 250 mL chemo infusion, 6 mg/kg = 546 mg (100 % of original dose 6 mg/kg), Intravenous,  Once, 30 of 30 cycles Dose modification: 6 mg/kg (original dose 6 mg/kg, Cycle 3, Reason: Other (see comments), Comment: requested step therapy by [facility] insurance), 4 mg/kg (original dose 6 mg/kg, Cycle 9, Reason: Provider Judgment), 6 mg/kg (original dose 6 mg/kg, Cycle 17, Reason: Provider Judgment), 4 mg/kg (original dose 6 mg/kg, Cycle 27, Reason: Provider Judgment), 6 mg/kg (original dose 6 mg/kg, Cycle 27, Reason: Provider Judgment) Administration: 546 mg (01/13/2018), 546 mg (02/10/2018), 546 mg (03/03/2018), 546 mg (03/24/2018), 546 mg (04/14/2018), 546 mg (05/05/2018), 357 mg (05/26/2018), 357 mg (06/09/2018), 357 mg (06/23/2018), 357 mg (07/07/2018), 357 mg (07/21/2018), 357 mg (08/04/2018), 357 mg (08/18/2018), 357 mg (09/01/2018), 546 mg (09/15/2018), 546 mg (10/06/2018), 546 mg (10/27/2018), 546 mg (11/18/2018), 546 mg (12/08/2018), 546 mg (12/29/2018), 546 mg (01/19/2019), 546 mg (02/16/2019), 480 mg (03/16/2019), 483 mg (04/13/2019), 450 mg (06/08/2019), 450 mg (07/06/2019), 450 mg (08/03/2019), 450 mg (08/31/2019), 450 mg (09/28/2019)  for chemotherapy treatment.    12/31/2017 - 09/01/2018 Chemotherapy   The patient had PACLitaxel-protein bound (ABRAXANE) chemo infusion 175 mg, 80 mg/m2 = 175 mg (100 % of original dose 80 mg/m2), Intravenous,  Once, 5 of 10 cycles Dose modification: 80 mg/m2 (original dose 80 mg/m2, Cycle  11) Administration: 175 mg (07/07/2018), 175 mg (07/21/2018), 175 mg (08/04/2018), 175 mg (08/18/2018), 175 mg (09/01/2018) PACLitaxel-protein bound (ABRAXANE) chemo infusion 200 mg, 100 mg/m2 = 200 mg, Intravenous, Once, 10 of 10 cycles Dose modification: 80 mg/m2 (original dose 100 mg/m2, Cycle 2, Reason: Provider Judgment), 80 mg/m2 (original dose 100 mg/m2, Cycle 2, Reason: Provider Judgment) Administration: 200 mg (12/31/2017), 200 mg (01/06/2018), 175 mg (01/20/2018), 175 mg (01/27/2018), 175 mg (02/10/2018), 175 mg (03/03/2018), 175 mg (03/10/2018), 175 mg (03/24/2018), 175 mg (04/07/2018), 175 mg (04/14/2018), 175 mg (04/28/2018), 175 mg (05/05/2018), 175 mg (05/26/2018), 175 mg (06/09/2018), 175 mg (05/19/2018)  for chemotherapy treatment.    10/05/2020 - 01/18/2021 Chemotherapy   Patient is on Treatment Plan : BREAST METASTATIC fam-trastuzumab deruxtecan-nxki (Enhertu) q21d     02/19/2021 - 05/14/2021 Chemotherapy   Patient is on Treatment Plan : BREAST Trastuzumab q21d x 13 cycles     06/20/2021 - 08/01/2021 Chemotherapy   Patient is on Treatment Plan : BREAST METASTATIC fam-trastuzumab deruxtecan-nxki (Enhertu) q21d     07/25/2021 - 10/19/2021 Chemotherapy   Patient is on Treatment Plan : BREAST METASTATIC Sacituzumab govitecan-hziy Ivette Loyal) D1,8 q21d     08/31/2021 - 09/21/2021 Chemotherapy   Patient is on Treatment Plan : BREAST METASTATIC Sacituzumab govitecan-hziy Ivette Loyal) q21d     01/30/2022 - 03/18/2022 Chemotherapy   Patient is on Treatment Plan : BREAST METASTATIC Eribulin D1,8 q21d     04/02/2022 -  Chemotherapy   Patient is on Treatment Plan : BREAST Carboplatin + Gemcitabine D1,8 q21d     Metastasis to liver ([place])  08/04/2017 Initial Diagnosis   Metastasis to liver ([place])   08/12/2017 - 10/14/2017 Chemotherapy   The patient had ado-trastuzumab emtansine (KADCYLA) 320 mg in sodium chloride 0.9 % 250 mL chemo infusion, 3.6 mg/kg = 320 mg, Intravenous, Once, 4 of 5 cycles Administration: 320 mg  (08/12/2017), 320 mg (09/01/2017), 320 mg (09/23/2017)  for chemotherapy treatment.    11/18/2017 - 08/30/2020 Chemotherapy   The patient had trastuzumab (HERCEPTIN) 546 mg in sodium chloride 0.9 % 250 mL chemo infusion, 6 mg/kg = 546 mg (100 % of original dose 6 mg/kg), Intravenous,  Once, 2 of 2 cycles Dose modification: 6 mg/kg (original dose 6 mg/kg, Cycle 1, Reason: Provider Judgment) Administration: 546 mg (11/18/2017), 546 mg (12/16/2017) pertuzumab (PERJETA) 420 mg in sodium chloride 0.9 % 250 mL chemo infusion, 420 mg (100 % of original dose 420 mg), Intravenous, Once, 15 of 15 cycles Dose modification: 420 mg (original dose 420 mg, Cycle 18), 420 mg (original dose 420 mg, Cycle 27) Administration: 420 mg (10/06/2018), 420 mg (10/27/2018), 420 mg (11/18/2018), 420 mg (12/08/2018), 420 mg (12/29/2018), 420 mg (01/19/2019), 420 mg (02/16/2019), 420 mg (03/16/2019), 420 mg (04/13/2019), 420 mg (06/08/2019), 420 mg (07/06/2019), 420 mg (08/03/2019), 420 mg (08/31/2019), 420 mg (09/28/2019), 420 mg (05/19/2019) trastuzumab-anns (KANJINTI) 546 mg in sodium chloride 0.9 % 250 mL chemo infusion, 6 mg/kg = 546 mg (100 % of original dose 6 mg/kg), Intravenous,  Once, 30 of 30 cycles Dose modification: 6 mg/kg (original dose 6 mg/kg, Cycle 3, Reason: Other (see comments), Comment: requested step therapy by [facility] insurance), 4 mg/kg (original dose 6 mg/kg, Cycle 9, Reason: Provider Judgment), 6 mg/kg (original dose 6 mg/kg, Cycle 17, Reason: Provider Judgment), 4 mg/kg (original dose 6 mg/kg, Cycle 27, Reason: Provider Judgment), 6 mg/kg (original dose 6 mg/kg, Cycle 27, Reason: Provider Judgment) Administration: 546 mg (01/13/2018), 546 mg (02/10/2018), 546 mg (03/03/2018), 546 mg (03/24/2018), 546 mg (04/14/2018), 546 mg (05/05/2018), 357 mg (05/26/2018), 357 mg (06/09/2018), 357 mg (06/23/2018), 357 mg (07/07/2018), 357 mg (07/21/2018), 357 mg (08/04/2018), 357 mg (08/18/2018), 357 mg (09/01/2018), 546 mg (09/15/2018), 546 mg (10/06/2018),  546 mg (10/27/2018), 546 mg (11/18/2018), 546 mg (12/08/2018), 546 mg (12/29/2018), 546 mg (01/19/2019), 546 mg (02/16/2019), 480 mg (03/16/2019), 483 mg (04/13/2019), 450 mg (06/08/2019), 450 mg (07/06/2019), 450 mg (08/03/2019), 450 mg (08/31/2019), 450 mg (09/28/2019)  for chemotherapy treatment.    01/30/2022 - 03/18/2022 Chemotherapy   Patient is on Treatment Plan : BREAST METASTATIC Eribulin D1,8 q21d     04/02/2022 -  Chemotherapy   Patient is on Treatment Plan : BREAST Carboplatin + Gemcitabine D1,8 q21d       CHIEF COMPLIANT: Cycle 1 day 8 carboplatin and gemcitabine  INTERVAL HISTORY: Darlene [name] is a 72 Year-old- with the above mentioned history of metastatic breast cancer who is currently on chemotherapy with carboplatin and gemcitabine.  Today cycle 1 day 8.  She is currently in a wheelchair because she feels extremely tired and weak.  She tells me that after chemotherapy got done she did not experience any major side effects but overall generalized fatigue and weakness kept persisting.  She had lost another 4 pounds.  Because we gave her steroids in the IV it appears that the sugars have gone up into the 300s at home.   ALLERGIES:  is allergic to antihistamines, diphenhydramine-type; nsaids; peanut allergen powder-dnfp; and tape.  MEDICATIONS:  Current Outpatient Medications  Medication Sig Dispense Refill   Accu-Chek Softclix Lancets lancets 1 each by  Other route 4 (four) times daily. Test blood sugars 4 x times daily 200 each 1   aspirin EC 81 MG tablet Take 1 tablet (81 mg total) by mouth daily. 90 tablet 3   Bempedoic Acid-Ezetimibe (NEXLIZET) 180-10 MG TABS Take 1 tablet by mouth daily. 90 tablet 3   Blood Glucose Calibration (ACCU-CHEK GUIDE CONTROL) LIQD 1 Bottle by In Vitro route as needed. 1 each 0   Blood Glucose Monitoring Suppl (ACCU-CHEK GUIDE) w/Device KIT 1 each by Does not apply route 4 (four) times daily. Use to test blood sugars 4x a day 1 kit 0   buPROPion  (WELLBUTRIN SR) 200 MG 12 hr tablet Take one tablet by mouth once daily. 90 tablet 0   CALCIUM CITRATE-VITAMIN D PO Take 1 tablet by mouth 3 (three) times daily.      carvedilol (COREG) 6.25 MG tablet Take 1 tablet (6.25 mg total) by mouth 2 (two) times daily. 180 tablet 3   cholecalciferol (VITAMIN D) 1000 units tablet Take 1,000 Units by mouth daily.     Cyanocobalamin (VITAMIN B-12 PO) Take 1 tablet by mouth every 14 (fourteen) days.     glucose blood (ACCU-CHEK GUIDE) test strip Use Accu Chek test strips to check blood sugar 4 times daily. DX:E11.21 400 each 3   insulin aspart (NOVOLOG) 100 UNIT/ML injection Inject into the skin daily as needed for high blood sugar. Per patient, sliding scale with meals as needed     insulin glargine (LANTUS SOLOSTAR) 100 UNIT/ML Solostar Pen Inject 8 Units into the skin daily. 7 mL 3   KERENDIA 20 MG TABS TAKE 1 TABLET EVERY DAY 90 tablet 3   Lactobacillus ([employer] ACIDOPHILUS PROBIOTIC) 0.5 MG TABS Take 1 tablet by mouth daily.     loperamide (IMODIUM) 2 MG capsule Take 2 mg by mouth as needed for diarrhea or loose stools.     melatonin 5 MG TABS Take 5 mg by mouth at bedtime.     Multiple Vitamins-Minerals (OPURITY) TABS Take by mouth.     ondansetron (ZOFRAN) 8 MG tablet Take 1 tablet (8 mg total) by mouth every 8 (eight) hours as needed for nausea or vomiting. 30 tablet 2   polyethylene glycol (MIRALAX / GLYCOLAX) 17 g packet Take 17 g by mouth daily.     prochlorperazine (COMPAZINE) 10 MG tablet Take 1 tablet (10 mg total) by mouth every 6 (six) hours as needed (Nausea or vomiting). 30 tablet 6   Propylene Glycol (SYSTANE BALANCE OP) Place 1-2 drops into both eyes 3 (three) times daily as needed (for dry eyes).      SURE COMFORT PEN NEEDLES 32G X 4 MM MISC Use as instructed to inject insulin and Victoza daily. 200 each 0   tobramycin (TOBREX) 0.3 % ophthalmic ointment Place 1 application into both eyes as needed (Takes day before eye injections).      vortioxetine HBr (TRINTELLIX) 20 MG TABS tablet Take 1 tablet (20 mg total) by mouth at bedtime. 90 tablet 0   No current facility-administered medications for this visit.   Facility-Administered Medications Ordered in Other Visits  Medication Dose Route Frequency Provider Last Rate Last Admin   0.9 %  sodium chloride infusion   Intravenous Once Serena Croissant, MD       acetaminophen (TYLENOL) 325 MG tablet            CARBOplatin (PARAPLATIN) 130 mg in sodium chloride 0.9 % 100 mL chemo infusion  130 mg Intravenous Once [place],  MD       diphenhydrAMINE (BENADRYL) 50 MG/ML injection            famotidine (PEPCID) 20-0.9 MG/50ML-% IVPB            gemcitabine (GEMZAR) 1,976 mg in sodium chloride 0.9 % 250 mL chemo infusion  1,000 mg/m2 (Treatment Plan Recorded) Intravenous Once Nicholas Lose, MD       heparin lock flush 100 unit/mL  500 Units Intracatheter Once PRN Nicholas Lose, MD       palonosetron (ALOXI) 0.25 MG/5ML injection            sodium chloride flush (NS) 0.9 % injection 10 mL  10 mL Intracatheter PRN Nicholas Lose, MD        PHYSICAL EXAMINATION: ECOG PERFORMANCE STATUS: 1 - Symptomatic but completely ambulatory  Vitals:   04/08/22 1216  BP: (!) 129/57  Pulse: 64  Resp: 13  Temp: (!) 97.3 F (36.3 C)  SpO2: 100%   Filed Weights   04/08/22 1216  Weight: 170 lb 1.6 oz (77.2 kg)    LABORATORY DATA:  I have reviewed the data as listed    Latest Ref Rng & Units 04/08/2022   10:18 AM 04/02/2022    2:38 PM 03/25/2022    8:51 AM  CMP  Glucose 70 - 99 mg/dL 229  182  218   BUN 8 - 23 mg/dL 61  50  35   Creatinine 0.44 - 1.00 mg/dL 1.62  1.92  1.78   Sodium 135 - 145 mmol/L 135  135  140   Potassium 3.5 - 5.1 mmol/L 5.1  4.8  4.8   Chloride 98 - 111 mmol/L 102  101  105   CO2 22 - 32 mmol/L 25  26  26    Calcium 8.9 - 10.3 mg/dL 10.3  9.5  9.4   Total Protein 6.5 - 8.1 g/dL 6.4  6.6  6.5   Total Bilirubin 0.3 - 1.2 mg/dL 0.6  0.4  0.5   Alkaline Phos 38 - 126 U/L  232  233  184   AST 15 - 41 U/L 88  114  67   ALT 0 - 44 U/L 35  33  19     Lab Results  Component Value Date   WBC 7.1 04/08/2022   HGB 10.8 (L) 04/08/2022   HCT 32.6 (L) 04/08/2022   MCV 95.0 04/08/2022   PLT 193 04/08/2022   NEUTROABS 6.4 04/08/2022    ASSESSMENT & PLAN:  Breast cancer of upper-outer quadrant of right female breast ([place]) 07/19/2014: Right breast cancer grade 2 IDC ER/PR negative HER2 positive Ki-67 90% treated with neoadjuvant chemo with TCHP x6 cycles followed by Herceptin maintenance, right lumpectomy had residual disease 4 cm 1/3 lymph node positive, ER/PR negative, adjuvant radiation and capecitabine completed 09/27/2015 12/06/2015: Recurrence: Right subpectoral lymph node and subcutaneous nodule, liver mass (biopsy adenocarcinoma consistent with upper GI or pancreaticobiliary primary, HER2 negative, ER negative) July 2019: Metastatic disease: TDM 1 followed by Herceptin maintenance, pertuzumab added 10/06/2018, Abraxane 12/31/2017-09/01/2018, Herceptin and pertuzumab maintenance   Prior treatment: Enhertu started 10/05/2020 switched to Herceptin maintenance 02/19/2021 switched to Enhertu at higher dosage x 3 cycles discontinued 08/01/2021 , Xeloda  started 11/12/2021 discontinued December 2023 for progression   02/04/2021: PET CT scan: Complete metabolic response, no evidence of hypermetabolic disease in the liver (mass measured 2.7 cm previously is now 1.5 cm with no uptake)    11/02/2021: CT CAP: Increase in the hepatic metastatic  lesions (7.5 cm to 8.3 cm) similar size of aortocaval, periportal, gastrohepatic lymph nodes ------------------------------------------------------------------------------------------------------------------------------------------- CT CAP 01/18/2022: Progressive metastatic disease in the liver (10.7 cm x 13 cm) and lymph nodes in the upper abdomen and retroperitoneum (2.9 cm and 2.3 cm and 1.7 cm)   Current treatment: Carboplatin and  gemcitabine started 04/02/2022   Tumor markers: Rising CEA levels are concerning that the cancer may not be responding to treatment 03/23/2022: CT CAP: Further progression of metastatic disease interval increase in size and number of mets in the liver, abdominal and retroperitoneal lymph nodes and mediastinal lymph nodes and numerous new pulmonary nodules   Current Treatment: Cycle 1 day 8 gemcitabine and carboplatin, Y-90 Treatment Chemo toxicities: Decadron causing hyperglycemia: We discontinued IV Decadron today. Severe generalized fatigue and weakness: Patient is using a wheelchair today. Mild nausea: Improved with antinausea medication.  Return to clinic in 2 weeks for cycle 2.  Orders Placed This Encounter  Procedures   CEA (Access)-[facility] ONLY    Standing Status:   Standing    Number of Occurrences:   50    Standing Expiration Date:   04/08/2023   The patient has a good understanding of the overall plan. she agrees with it. she will call with any problems that may develop before the next visit here. Total time spent: 30 mins including face to face time and time spent for planning, charting and co-ordination of care   Tamsen Meek, MD 04/08/22    I Janan Ridge am acting as a Neurosurgeon for [employer]  I have reviewed the above documentation for accuracy and completeness, and I agree with the above.

## 2022-04-03 ENCOUNTER — Telehealth: Payer: Self-pay

## 2022-04-03 ENCOUNTER — Other Ambulatory Visit: Payer: Self-pay | Admitting: Interventional Radiology

## 2022-04-03 DIAGNOSIS — C787 Secondary malignant neoplasm of liver and intrahepatic bile duct: Secondary | ICD-10-CM

## 2022-04-03 NOTE — Telephone Encounter (Signed)
Darlene [name] states that she is doing fine.She is eating, drinking and urinating well. Her blood sugar was a little high this morning at 178. Told her that she received a dose of decadron with her premedications yesterday which is a steroid and can raise BS.  She will use her sliding scale insulin to decrease BS. She will call MD that manages her BS if it does not respond to the additional dose of insulin. She knows to call the office at [phone] if she has nay questions or concerns.

## 2022-04-03 NOTE — Telephone Encounter (Signed)
-----   Message from Ishmael Holter, RN sent at 04/02/2022  6:50 PM EDT ----- Regarding: 1st tx fu call Dr. Lindi Adie  Dr Lindi Adie 1st tx f/u call. Gemzar/carbo. Tolerated well

## 2022-04-05 MED FILL — Dexamethasone Sodium Phosphate Inj 100 MG/10ML: INTRAMUSCULAR | Qty: 1 | Status: AC

## 2022-04-07 NOTE — Assessment & Plan Note (Signed)
07/19/2014: Right breast cancer grade 2 IDC ER/PR negative HER2 positive Ki-67 90% treated with neoadjuvant chemo with TCHP x6 cycles followed by Herceptin maintenance, right lumpectomy had residual disease 4 cm 1/3 lymph node positive, ER/PR negative, adjuvant radiation and capecitabine completed 09/27/2015 12/06/2015: Recurrence: Right subpectoral lymph node and subcutaneous nodule, liver mass (biopsy adenocarcinoma consistent with upper GI or pancreaticobiliary primary, HER2 negative, ER negative) July 2019: Metastatic disease: TDM 1 followed by Herceptin maintenance, pertuzumab added 10/06/2018, Abraxane 12/31/2017-09/01/2018, Herceptin and pertuzumab maintenance   Prior treatment: Enhertu started 10/05/2020 switched to Herceptin maintenance 02/19/2021 switched to Enhertu at higher dosage x 3 cycles discontinued 08/01/2021 , Xeloda  started 11/12/2021 discontinued December 2023 for progression   02/04/2021: PET CT scan: Complete metabolic response, no evidence of hypermetabolic disease in the liver (mass measured 2.7 cm previously is now 1.5 cm with no uptake)    11/02/2021: CT CAP: Increase in the hepatic metastatic lesions (7.5 cm to 8.3 cm) similar size of aortocaval, periportal, gastrohepatic lymph nodes ------------------------------------------------------------------------------------------------------------------------------------------- CT CAP 01/18/2022: Progressive metastatic disease in the liver (10.7 cm x 13 cm) and lymph nodes in the upper abdomen and retroperitoneum (2.9 cm and 2.3 cm and 1.7 cm)   Current treatment: Eribulin started 01/30/2022 Eribulin toxicities:  Leukopenia: We may keep the dosage the same if ANC is greater than 1000 she may need Neulasta after day 8 if necessary. Anemia: Slightly worse since she started chemo.  Fatigue   Tumor markers: Rising CEA levels are concerning that the cancer may not be responding to treatment 03/23/2022: CT CAP: Further progression of metastatic  disease interval increase in size and number of mets in the liver, abdominal and retroperitoneal lymph nodes and mediastinal lymph nodes and numerous new pulmonary nodules   Current Treatment: Cycle 1 Gemcitabine and carboplatin, Y-90 Treatment

## 2022-04-08 ENCOUNTER — Inpatient Hospital Stay: Payer: Medicare PPO

## 2022-04-08 ENCOUNTER — Inpatient Hospital Stay (HOSPITAL_BASED_OUTPATIENT_CLINIC_OR_DEPARTMENT_OTHER): Payer: Medicare PPO | Admitting: Hematology and Oncology

## 2022-04-08 VITALS — BP 129/57 | HR 64 | Temp 97.3°F | Resp 13 | Wt 170.1 lb

## 2022-04-08 VITALS — BP 127/61 | HR 86 | Temp 97.5°F | Resp 18

## 2022-04-08 DIAGNOSIS — C787 Secondary malignant neoplasm of liver and intrahepatic bile duct: Secondary | ICD-10-CM | POA: Diagnosis not present

## 2022-04-08 DIAGNOSIS — C50411 Malignant neoplasm of upper-outer quadrant of right female breast: Secondary | ICD-10-CM

## 2022-04-08 DIAGNOSIS — D72819 Decreased white blood cell count, unspecified: Secondary | ICD-10-CM | POA: Diagnosis not present

## 2022-04-08 DIAGNOSIS — Z95828 Presence of other vascular implants and grafts: Secondary | ICD-10-CM

## 2022-04-08 DIAGNOSIS — R5383 Other fatigue: Secondary | ICD-10-CM | POA: Diagnosis not present

## 2022-04-08 DIAGNOSIS — Z5111 Encounter for antineoplastic chemotherapy: Secondary | ICD-10-CM | POA: Diagnosis not present

## 2022-04-08 DIAGNOSIS — R188 Other ascites: Secondary | ICD-10-CM | POA: Diagnosis not present

## 2022-04-08 DIAGNOSIS — D649 Anemia, unspecified: Secondary | ICD-10-CM | POA: Diagnosis not present

## 2022-04-08 DIAGNOSIS — R739 Hyperglycemia, unspecified: Secondary | ICD-10-CM | POA: Diagnosis not present

## 2022-04-08 DIAGNOSIS — Z171 Estrogen receptor negative status [ER-]: Secondary | ICD-10-CM | POA: Diagnosis not present

## 2022-04-08 LAB — CMP (CANCER CENTER ONLY)
ALT: 35 U/L (ref 0–44)
AST: 88 U/L — ABNORMAL HIGH (ref 15–41)
Albumin: 3.4 g/dL — ABNORMAL LOW (ref 3.5–5.0)
Alkaline Phosphatase: 232 U/L — ABNORMAL HIGH (ref 38–126)
Anion gap: 8 (ref 5–15)
BUN: 61 mg/dL — ABNORMAL HIGH (ref 8–23)
CO2: 25 mmol/L (ref 22–32)
Calcium: 10.3 mg/dL (ref 8.9–10.3)
Chloride: 102 mmol/L (ref 98–111)
Creatinine: 1.62 mg/dL — ABNORMAL HIGH (ref 0.44–1.00)
GFR, Estimated: 34 mL/min — ABNORMAL LOW (ref >60)
Glucose, Bld: 229 mg/dL — ABNORMAL HIGH (ref 70–99)
Potassium: 5.1 mmol/L (ref 3.5–5.1)
Sodium: 135 mmol/L (ref 135–145)
Total Bilirubin: 0.6 mg/dL (ref 0.3–1.2)
Total Protein: 6.4 g/dL — ABNORMAL LOW (ref 6.5–8.1)

## 2022-04-08 LAB — CBC WITH DIFFERENTIAL (CANCER CENTER ONLY)
Abs Immature Granulocytes: 0.05 10*3/uL (ref 0.00–0.07)
Basophils Absolute: 0 10*3/uL (ref 0.0–0.1)
Basophils Relative: 0 %
Eosinophils Absolute: 0 10*3/uL (ref 0.0–0.5)
Eosinophils Relative: 0 %
HCT: 32.6 % — ABNORMAL LOW (ref 36.0–46.0)
Hemoglobin: 10.8 g/dL — ABNORMAL LOW (ref 12.0–15.0)
Immature Granulocytes: 1 %
Lymphocytes Relative: 7 %
Lymphs Abs: 0.5 10*3/uL — ABNORMAL LOW (ref 0.7–4.0)
MCH: 31.5 pg (ref 26.0–34.0)
MCHC: 33.1 g/dL (ref 30.0–36.0)
MCV: 95 fL (ref 80.0–100.0)
Monocytes Absolute: 0.1 10*3/uL (ref 0.1–1.0)
Monocytes Relative: 1 %
Neutro Abs: 6.4 10*3/uL (ref 1.7–7.7)
Neutrophils Relative %: 91 %
Platelet Count: 193 10*3/uL (ref 150–400)
RBC: 3.43 MIL/uL — ABNORMAL LOW (ref 3.87–5.11)
RDW: 15.4 % (ref 11.5–15.5)
WBC Count: 7.1 10*3/uL (ref 4.0–10.5)
nRBC: 0 % (ref 0.0–0.2)

## 2022-04-08 MED ORDER — SODIUM CHLORIDE 0.9 % IV SOLN
131.8000 mg | Freq: Once | INTRAVENOUS | Status: AC
  Administered 2022-04-08: 130 mg via INTRAVENOUS
  Filled 2022-04-08: qty 13

## 2022-04-08 MED ORDER — SODIUM CHLORIDE 0.9 % IV SOLN: Freq: Once | INTRAVENOUS | Status: DC

## 2022-04-08 MED ORDER — HEPARIN SOD (PORK) LOCK FLUSH 100 UNIT/ML IV SOLN
500.0000 [IU] | Freq: Once | INTRAVENOUS | Status: AC | PRN
  Administered 2022-04-08: 500 [IU]

## 2022-04-08 MED ORDER — SODIUM CHLORIDE 0.9% FLUSH
10.0000 mL | Freq: Once | INTRAVENOUS | Status: AC
  Administered 2022-04-08: 10 mL

## 2022-04-08 MED ORDER — SODIUM CHLORIDE 0.9 % IV SOLN: Freq: Once | INTRAVENOUS | Status: AC

## 2022-04-08 MED ORDER — SODIUM CHLORIDE 0.9% FLUSH
10.0000 mL | INTRAVENOUS | Status: DC | PRN
  Administered 2022-04-08: 10 mL

## 2022-04-08 MED ORDER — PALONOSETRON HCL INJECTION 0.25 MG/5ML
0.2500 mg | Freq: Once | INTRAVENOUS | Status: AC
  Administered 2022-04-08: 0.25 mg via INTRAVENOUS
  Filled 2022-04-08: qty 5

## 2022-04-08 MED ORDER — FAMOTIDINE IN NACL 20-0.9 MG/50ML-% IV SOLN
20.0000 mg | Freq: Once | INTRAVENOUS | Status: AC
  Administered 2022-04-08: 20 mg via INTRAVENOUS
  Filled 2022-04-08: qty 50

## 2022-04-08 MED ORDER — SODIUM CHLORIDE 0.9 % IV SOLN
1000.0000 mg/m2 | Freq: Once | INTRAVENOUS | Status: AC
  Administered 2022-04-08: 1976 mg via INTRAVENOUS
  Filled 2022-04-08: qty 51.97

## 2022-04-08 NOTE — Progress Notes (Signed)
Per Dr. Lindi Adie, [place] to proceed with CBC & CMP results with treatment.

## 2022-04-09 ENCOUNTER — Ambulatory Visit: Payer: Medicare PPO

## 2022-04-09 DIAGNOSIS — C50911 Malignant neoplasm of unspecified site of right female breast: Secondary | ICD-10-CM | POA: Diagnosis not present

## 2022-04-09 NOTE — Progress Notes (Unsigned)
ACUTE VISIT No chief complaint on file.  HPI: [name] is a 72 y.o. female, who is here today complaining of *** HPI  Review of Systems See other pertinent positives and negatives in HPI.  Current Outpatient Medications on File Prior to Visit  Medication Sig Dispense Refill   Accu-Chek Softclix Lancets lancets 1 each by Other route 4 (four) times daily. Test blood sugars 4 x times daily 200 each 1   aspirin EC 81 MG tablet Take 1 tablet (81 mg total) by mouth daily. 90 tablet 3   Bempedoic Acid-Ezetimibe (NEXLIZET) 180-10 MG TABS Take 1 tablet by mouth daily. 90 tablet 3   Blood Glucose Calibration (ACCU-CHEK GUIDE CONTROL) LIQD 1 Bottle by In Vitro route as needed. 1 each 0   Blood Glucose Monitoring Suppl (ACCU-CHEK GUIDE) w/Device KIT 1 each by Does not apply route 4 (four) times daily. Use to test blood sugars 4x a day 1 kit 0   buPROPion (WELLBUTRIN SR) 200 MG 12 hr tablet Take one tablet by mouth once daily. 90 tablet 0   CALCIUM CITRATE-VITAMIN D PO Take 1 tablet by mouth 3 (three) times daily.      carvedilol (COREG) 6.25 MG tablet Take 1 tablet (6.25 mg total) by mouth 2 (two) times daily. 180 tablet 3   cholecalciferol (VITAMIN D) 1000 units tablet Take 1,000 Units by mouth daily.     Cyanocobalamin (VITAMIN B-12 PO) Take 1 tablet by mouth every 14 (fourteen) days.     glucose blood (ACCU-CHEK GUIDE) test strip Use Accu Chek test strips to check blood sugar 4 times daily. DX:E11.21 400 each 3   insulin aspart (NOVOLOG) 100 UNIT/ML injection Inject into the skin daily as needed for high blood sugar. Per patient, sliding scale with meals as needed     insulin glargine (LANTUS SOLOSTAR) 100 UNIT/ML Solostar Pen Inject 8 Units into the skin daily. 7 mL 3   KERENDIA 20 MG TABS TAKE 1 TABLET EVERY DAY 90 tablet 3   Lactobacillus ([employer]S ACIDOPHILUS PROBIOTIC) 0.5 MG TABS Take 1 tablet by mouth daily.     loperamide (IMODIUM) 2 MG capsule Take 2 mg by mouth as needed  for diarrhea or loose stools.     melatonin 5 MG TABS Take 5 mg by mouth at bedtime.     Multiple Vitamins-Minerals (OPURITY) TABS Take by mouth.     ondansetron (ZOFRAN) 8 MG tablet Take 1 tablet (8 mg total) by mouth every 8 (eight) hours as needed for nausea or vomiting. 30 tablet 2   polyethylene glycol (MIRALAX / GLYCOLAX) 17 g packet Take 17 g by mouth daily.     prochlorperazine (COMPAZINE) 10 MG tablet Take 1 tablet (10 mg total) by mouth every 6 (six) hours as needed (Nausea or vomiting). 30 tablet 6   Propylene Glycol (SYSTANE BALANCE OP) Place 1-2 drops into both eyes 3 (three) times daily as needed (for dry eyes).      SURE COMFORT PEN NEEDLES 32G X 4 MM MISC Use as instructed to inject insulin and Victoza daily. 200 each 0   tobramycin (TOBREX) 0.3 % ophthalmic ointment Place 1 application into both eyes as needed (Takes day before eye injections).     vortioxetine HBr (TRINTELLIX) 20 MG TABS tablet Take 1 tablet (20 mg total) by mouth at bedtime. 90 tablet 0   Current Facility-Administered Medications on File Prior to Visit  Medication Dose Route Frequency Provider Last Rate Last Admin   acetaminophen (TYLENOL)  325 MG tablet            diphenhydrAMINE (BENADRYL) 50 MG/ML injection            famotidine (PEPCID) 20-0.9 MG/50ML-% IVPB            palonosetron (ALOXI) 0.25 MG/5ML injection             Past Medical History:  Diagnosis Date   Anemia    hx of - during 1st round chemo   Anxiety    Arthritis    in fingers, shoulders   Back pain    Balance problem    Breast cancer (HCC)    Breast cancer of upper-outer quadrant of right female breast ([place]) 07/22/2014   Bunion, right foot    CHF (congestive heart failure) (HCC)    Chronic kidney disease    creatininei levels high per pt    Clumsiness    Constipation    Depression    Diabetes mellitus without complication ([place])    123456 years type 2    Diabetic retinopathy ([place])    torn retina   Eating disorder    binge eating    Epistaxis 08/26/2014   Fatty liver    Foot drop, left foot    HCAP (healthcare-associated pneumonia) 12/18/2014   Heart murmur    never had any problems   History of radiation therapy 04/05/15-05/19/15   right chest wall was treated to 50.4 Gy in 28 fractions, right mastectomy scar was treated to 10 Gy in 5 fractions   Hyperlipidemia    Hypertension    Joint pain    Multiple food allergies    Neuromuscular disorder ([place])    diabetic neuropathy   Obesity    Obstructive sleep apnea on CPAP    does not use cpap all the time has lost 160 lbs    Ovarian cyst rupture    possible   Pneumonia    Pneumonia 11/2014   Radiation 04/05/15-05/19/15   right chest wall 50.4 Gy, mastectomy scar 10 Gy   Sleep apnea    Thyroid nodule 06/2014   Allergies  Allergen Reactions   Antihistamines, Diphenhydramine-Type Other (See Comments)    IV diphenhydramine causes sedation.   Nsaids Other (See Comments)    H/o gastric bypass - avoid NSAIDs   Peanut Allergen Powder-Dnfp Itching   Tape Hives, Rash and Other (See Comments)    Adhesives in bandaids    Social History   Socioeconomic History   Marital status: Married    Spouse name: [name]   Number of children: 0   Years of education: Bachelor's   Highest education level: Bachelor's degree (e.g., BA, AB, BS)  Occupational History   Occupation: Pensions consultant: [employer]  Tobacco Use   Smoking status: Never   Smokeless tobacco: Never  Vaping Use   Vaping Use: Never used  Substance and Sexual Activity   Alcohol use: Yes    Alcohol/week: 1.0 - 2.0 standard drink of alcohol    Types: 1 - 2 Standard drinks or equivalent per week    Comment: social   Drug use: No   Sexual activity: Yes    Partners: Male    Birth control/protection: Post-menopausal, Other-see comments    Comment: husband vasectomy  Other Topics Concern   Not on file  Social History Narrative   Patient is married Health and safety inspector) and lives at home with her  husband.   Patient does not have any children.  Patient is working for a [employer].   Patient has a Bachelor's degree.   Patient is right-handed.   Patient does not drink any caffeine.      Right breast removed   Social Determinants of Health   Financial Resource Strain: Low Risk  (05/19/2021)   Overall Financial Resource Strain (CARDIA)    Difficulty of Paying Living Expenses: Not hard at all  Food Insecurity: No Food Insecurity (05/19/2021)   Hunger Vital Sign    Worried About Running Out of Food in the Last Year: Never true    Ran Out of Food in the Last Year: Never true  Transportation Needs: No Transportation Needs (05/19/2021)   PRAPARE - Hydrologist (Medical): No    Lack of Transportation (Non-Medical): No  Physical Activity: Unknown (05/19/2021)   Exercise Vital Sign    Days of Exercise per Week: 1 day    Minutes of Exercise per Session: Not on file  Stress: No Stress Concern Present (05/19/2021)   [place]    Feeling of Stress : Not at all  Social Connections: [place] (05/19/2021)   Social Connection and Isolation Panel [NHANES]    Frequency of Communication with Friends and Family: Three times a week    Frequency of Social Gatherings with Friends and Family: Twice a week    Attends Religious Services: 1 to 4 times per year    Active Member of [employer] or Organizations: Yes    Attends Music therapist: More than 4 times per year    Marital Status: Married    There were no vitals filed for this visit. There is no height or weight on file to calculate BMI.  Physical Exam  ASSESSMENT AND PLAN: There are no diagnoses linked to this encounter.  No follow-ups on file.  Strother Everitt G. [place], MD  [facility]. [place] office.  Discharge Instructions   None

## 2022-04-10 ENCOUNTER — Inpatient Hospital Stay: Payer: Medicare PPO

## 2022-04-10 ENCOUNTER — Other Ambulatory Visit: Payer: Self-pay

## 2022-04-10 ENCOUNTER — Ambulatory Visit: Payer: Medicare PPO | Admitting: Family Medicine

## 2022-04-10 ENCOUNTER — Encounter: Payer: Self-pay | Admitting: Family Medicine

## 2022-04-10 ENCOUNTER — Ambulatory Visit: Payer: Medicare PPO

## 2022-04-10 VITALS — BP 110/70 | HR 100 | Temp 97.7°F | Resp 16 | Ht 69.0 in | Wt 174.1 lb

## 2022-04-10 VITALS — BP 118/59 | HR 83 | Temp 98.1°F | Resp 16

## 2022-04-10 DIAGNOSIS — C787 Secondary malignant neoplasm of liver and intrahepatic bile duct: Secondary | ICD-10-CM | POA: Diagnosis not present

## 2022-04-10 DIAGNOSIS — R350 Frequency of micturition: Secondary | ICD-10-CM | POA: Diagnosis not present

## 2022-04-10 DIAGNOSIS — Z171 Estrogen receptor negative status [ER-]: Secondary | ICD-10-CM | POA: Diagnosis not present

## 2022-04-10 DIAGNOSIS — C50411 Malignant neoplasm of upper-outer quadrant of right female breast: Secondary | ICD-10-CM | POA: Diagnosis not present

## 2022-04-10 DIAGNOSIS — R5383 Other fatigue: Secondary | ICD-10-CM | POA: Diagnosis not present

## 2022-04-10 DIAGNOSIS — E1140 Type 2 diabetes mellitus with diabetic neuropathy, unspecified: Secondary | ICD-10-CM | POA: Diagnosis not present

## 2022-04-10 DIAGNOSIS — Z5111 Encounter for antineoplastic chemotherapy: Secondary | ICD-10-CM | POA: Diagnosis not present

## 2022-04-10 DIAGNOSIS — D649 Anemia, unspecified: Secondary | ICD-10-CM | POA: Diagnosis not present

## 2022-04-10 DIAGNOSIS — D72819 Decreased white blood cell count, unspecified: Secondary | ICD-10-CM | POA: Diagnosis not present

## 2022-04-10 DIAGNOSIS — Z794 Long term (current) use of insulin: Secondary | ICD-10-CM

## 2022-04-10 DIAGNOSIS — R739 Hyperglycemia, unspecified: Secondary | ICD-10-CM | POA: Diagnosis not present

## 2022-04-10 DIAGNOSIS — R188 Other ascites: Secondary | ICD-10-CM | POA: Diagnosis not present

## 2022-04-10 LAB — POC URINALSYSI DIPSTICK (AUTOMATED)
Bilirubin, UA: NEGATIVE
Blood, UA: NEGATIVE
Glucose, UA: NEGATIVE
Ketones, UA: NEGATIVE
Leukocytes, UA: NEGATIVE
Nitrite, UA: NEGATIVE
Protein, UA: NEGATIVE
Spec Grav, UA: 1.015 (ref 1.010–1.025)
Urobilinogen, UA: 0.2 E.U./dL
pH, UA: 5.5 (ref 5.0–8.0)

## 2022-04-10 MED ORDER — PEGFILGRASTIM-CBQV 6 MG/0.6ML ~~LOC~~ SOSY
6.0000 mg | PREFILLED_SYRINGE | Freq: Once | SUBCUTANEOUS | Status: AC
  Administered 2022-04-10: 6 mg via SUBCUTANEOUS
  Filled 2022-04-10: qty 0.6

## 2022-04-10 NOTE — Patient Instructions (Addendum)
A few things to remember from today's visit:  Urinary frequency  Type 2 diabetes mellitus with diabetic neuropathy, with long-term current use of insulin (HCC)  We will follow urine culture. Increase Lantus from 8 U to 11 U. Avoid fluids 3-4 hours before bedtime. Monitor for new symptoms.  Do not use My Chart to request refills or for acute issues that need immediate attention. If you send a my chart message, it may take a few days to be addressed, specially if I am not in the office.  Please be sure medication list is accurate. If a new problem present, please set up appointment sooner than planned today.

## 2022-04-11 ENCOUNTER — Other Ambulatory Visit: Payer: Self-pay | Admitting: Internal Medicine

## 2022-04-11 ENCOUNTER — Telehealth: Payer: Self-pay | Admitting: Nurse Practitioner

## 2022-04-11 ENCOUNTER — Encounter: Payer: Self-pay | Admitting: Hematology and Oncology

## 2022-04-11 ENCOUNTER — Other Ambulatory Visit: Payer: Self-pay | Admitting: *Deleted

## 2022-04-11 ENCOUNTER — Other Ambulatory Visit: Payer: Medicare PPO

## 2022-04-11 DIAGNOSIS — C787 Secondary malignant neoplasm of liver and intrahepatic bile duct: Secondary | ICD-10-CM

## 2022-04-11 DIAGNOSIS — C50411 Malignant neoplasm of upper-outer quadrant of right female breast: Secondary | ICD-10-CM

## 2022-04-11 DIAGNOSIS — Z515 Encounter for palliative care: Secondary | ICD-10-CM

## 2022-04-11 DIAGNOSIS — Z171 Estrogen receptor negative status [ER-]: Secondary | ICD-10-CM

## 2022-04-11 LAB — URINE CULTURE
MICRO NUMBER:: 14718157
Result:: NO GROWTH
SPECIMEN QUALITY:: ADEQUATE

## 2022-04-11 NOTE — Telephone Encounter (Signed)
Per 3/21 IB reached out to patient to schedule; patient aware of date and time of appointment.

## 2022-04-11 NOTE — Progress Notes (Signed)
Received call from pt requesting palliative care consult with [facility] as well as our in house Palliative care.  RN reviewed with MD and verbal orders received and placed.

## 2022-04-12 NOTE — Progress Notes (Signed)
COMMUNITY PALLIATIVE CARE SW NOTE  PATIENT NAME: Darlene [name] DOB: 07-13-1950 MRN: [id number]  PRIMARY CARE PROVIDER: Isaac Bliss, Rayford Halsted, MD  RESPONSIBLE PARTY:  Acct ID - Guarantor Home Phone Work Phone Relationship Acct Type  [id number] [name], [name]  Self P/F     [address], [place], [place] [address]   Initial Palliative Care Telephonic Encounter  PC SW completed an initial palliative care telephonic encounter with patient. SW provided introductions and education regarding palliative care services, visit frequencies and role in patient's care. SW also provided contact information. Patient provided an initial consent to services and a status update.  Patient report that she has not been feeling well. She has been incontinent for the last three nights. She is very weak in her knees and joints. Her balance is off, where she is having difficulty walking. Patient report that she is diabetic and her blood sugars have been running very high. Patient report that her appetite is poor, but she makes her self eat. Patient report that she has lost of 41 lbs total and her current weight is 174 lbs. Patient report that she has started a new chemotherapy, she is hoping that yields positive results. The other two treatments did not work.   Patient was open to a face-to-face visit. SW scheduled a visit  for 04/16/22 @ 3:30 with palliative care nurse.   Social History   Tobacco Use   Smoking status: Never   Smokeless tobacco: Never  Substance Use Topics   Alcohol use: Yes    Alcohol/week: 1.0 - 2.0 standard drink of alcohol    Types: 1 - 2 Standard drinks or equivalent per week    Comment: social    CODE STATUS: DNR ADVANCED DIRECTIVES: Yes MOST FORM COMPLETE:  No HOSPICE EDUCATION PROVIDED: Yes, briefly  Duration of encounter and documentation: 30 minutes  [employer], LCSW

## 2022-04-15 ENCOUNTER — Other Ambulatory Visit: Payer: Self-pay | Admitting: Interventional Radiology

## 2022-04-15 ENCOUNTER — Other Ambulatory Visit: Payer: Medicare PPO

## 2022-04-15 ENCOUNTER — Ambulatory Visit: Payer: Medicare PPO

## 2022-04-15 ENCOUNTER — Other Ambulatory Visit: Payer: Self-pay

## 2022-04-15 VITALS — BP 108/60 | HR 92 | Resp 16

## 2022-04-15 DIAGNOSIS — C787 Secondary malignant neoplasm of liver and intrahepatic bile duct: Secondary | ICD-10-CM

## 2022-04-15 DIAGNOSIS — Z515 Encounter for palliative care: Secondary | ICD-10-CM

## 2022-04-16 ENCOUNTER — Other Ambulatory Visit: Payer: Medicare PPO

## 2022-04-16 ENCOUNTER — Ambulatory Visit: Payer: Medicare PPO

## 2022-04-18 ENCOUNTER — Other Ambulatory Visit (INDEPENDENT_AMBULATORY_CARE_PROVIDER_SITE_OTHER): Payer: Self-pay | Admitting: Family Medicine

## 2022-04-18 DIAGNOSIS — F3289 Other specified depressive episodes: Secondary | ICD-10-CM

## 2022-04-18 NOTE — Progress Notes (Signed)
[place] Initial Encounter Note  PATIENT NAME: Darlene [name] DOB: 1950-12-14 MRN: [id number]  PRIMARY CARE PROVIDER: Isaac Bliss, Rayford Halsted, MD  RESPONSIBLE PARTY:  Acct ID - Guarantor Home Phone Work Phone Relationship Acct Type  [id number] CHESTER, [phone]  Self P/F     [address], [place], [place] [address]   RN completed home visit. Husband also present   HISTORY OF PRESENT ILLNESS:  72 y.o. female with past medical history significant for DM 2, OSA, depression, peripheral neuropathy, hypertension, CKD 4, and breast cancer with liver metastasis. Pt went to see PCP on 04/10/22 complaining of urinary frequency, urgency, and episodes of incontinence for over a week. Urine dipstick negative. Decision made to hold off on empiric abx. Increase lantus from 8 to 11 units. Pt is still interested in continuing chemo and is going to have radioactive beads implanted (Y-90) for liver cancer.  Socially: Lives with husband in single story home. Has lived here for 4 years. Closest family is daughter in [place].   Cognitive: alert and oriented. Tends to lose her train of thought easily but catches back up quickly   Appetite: Eats 3 meals per day "but really small amts". Ex: breakfast today was 1 and  sausage patties and half a bagel with jelly.  Had Ruin-Y bypass 9 years ago, so does not eat large amounts, however intake is much lower than it was in past.   Mobility: Stronger in the mornings. Husband has to help her walk in the evenings due to weakness. Now using a gait belt when he assists her. Has had 2 big falls in the last couple of months. Has drop foot and wears braces when she goes out.    Sleeping Pattern: Sleeps about 11 hours per night but interrupted by going to the bathroom. And takes 2 good naps per day.  Pain: denies any c/o pain. States, " I really never had pain".   GI/GU: pt reports increased urgency and incontinence. Wears depends.  No issues with bowels.   Palliative Care/ Hospice: RN explained role and purpose of palliative care including visit frequency. Also discussed benefits of hospice care as well as the differences between the two with patient.   Goals of Care: Wants to be steadier on her feet.   RN to ask pcp for home health PT, OT, maybe speech.    CODE STATUS: Full Code ADVANCED DIRECTIVES: Yes (noted in Epic) MOST FORM: No PPS:   Next appt scheduled: 05/14/22     PHYSICAL EXAM:   VITALS: Today's Vitals   04/15/22 1420  BP: 108/60  Pulse: 92  Resp: 16  SpO2: 96%    LUNGS: clear bilat, no cough CARDIAC:  regular, no JVD EXTREMITIES: WNL, No edema Abdomen: Distended, firm on right side.    Jacqulyn Cane, RN

## 2022-04-19 ENCOUNTER — Encounter (HOSPITAL_COMMUNITY)
Admission: RE | Admit: 2022-04-19 | Discharge: 2022-04-19 | Disposition: A | Discharge status: Home / Self Care | Payer: Medicare PPO | Source: Ambulatory Visit | Attending: Interventional Radiology | Admitting: Interventional Radiology

## 2022-04-19 DIAGNOSIS — Z9884 Bariatric surgery status: Secondary | ICD-10-CM | POA: Diagnosis not present

## 2022-04-19 DIAGNOSIS — I5032 Chronic diastolic (congestive) heart failure: Secondary | ICD-10-CM | POA: Diagnosis present

## 2022-04-19 DIAGNOSIS — C50911 Malignant neoplasm of unspecified site of right female breast: Secondary | ICD-10-CM | POA: Diagnosis not present

## 2022-04-19 DIAGNOSIS — I130 Hypertensive heart and chronic kidney disease with heart failure and stage 1 through stage 4 chronic kidney disease, or unspecified chronic kidney disease: Secondary | ICD-10-CM | POA: Diagnosis present

## 2022-04-19 DIAGNOSIS — R42 Dizziness and giddiness: Secondary | ICD-10-CM | POA: Diagnosis not present

## 2022-04-19 DIAGNOSIS — C787 Secondary malignant neoplasm of liver and intrahepatic bile duct: Secondary | ICD-10-CM

## 2022-04-19 DIAGNOSIS — C772 Secondary and unspecified malignant neoplasm of intra-abdominal lymph nodes: Secondary | ICD-10-CM | POA: Diagnosis present

## 2022-04-19 DIAGNOSIS — N189 Chronic kidney disease, unspecified: Secondary | ICD-10-CM | POA: Diagnosis not present

## 2022-04-19 DIAGNOSIS — D62 Acute posthemorrhagic anemia: Secondary | ICD-10-CM | POA: Diagnosis present

## 2022-04-19 DIAGNOSIS — F32A Depression, unspecified: Secondary | ICD-10-CM | POA: Diagnosis present

## 2022-04-19 DIAGNOSIS — K5731 Diverticulosis of large intestine without perforation or abscess with bleeding: Secondary | ICD-10-CM | POA: Diagnosis not present

## 2022-04-19 DIAGNOSIS — E1142 Type 2 diabetes mellitus with diabetic polyneuropathy: Secondary | ICD-10-CM | POA: Diagnosis present

## 2022-04-19 DIAGNOSIS — I10 Essential (primary) hypertension: Secondary | ICD-10-CM | POA: Diagnosis not present

## 2022-04-19 DIAGNOSIS — R188 Other ascites: Secondary | ICD-10-CM | POA: Diagnosis present

## 2022-04-19 DIAGNOSIS — R7989 Other specified abnormal findings of blood chemistry: Secondary | ICD-10-CM | POA: Diagnosis not present

## 2022-04-19 DIAGNOSIS — K280 Acute gastrojejunal ulcer with hemorrhage: Secondary | ICD-10-CM | POA: Diagnosis present

## 2022-04-19 DIAGNOSIS — K920 Hematemesis: Secondary | ICD-10-CM | POA: Diagnosis not present

## 2022-04-19 DIAGNOSIS — I700 Atherosclerosis of aorta: Secondary | ICD-10-CM | POA: Diagnosis present

## 2022-04-19 DIAGNOSIS — E1129 Type 2 diabetes mellitus with other diabetic kidney complication: Secondary | ICD-10-CM | POA: Diagnosis not present

## 2022-04-19 DIAGNOSIS — E1122 Type 2 diabetes mellitus with diabetic chronic kidney disease: Secondary | ICD-10-CM | POA: Diagnosis present

## 2022-04-19 DIAGNOSIS — C50411 Malignant neoplasm of upper-outer quadrant of right female breast: Secondary | ICD-10-CM | POA: Diagnosis present

## 2022-04-19 DIAGNOSIS — R591 Generalized enlarged lymph nodes: Secondary | ICD-10-CM | POA: Diagnosis not present

## 2022-04-19 DIAGNOSIS — K289 Gastrojejunal ulcer, unspecified as acute or chronic, without hemorrhage or perforation: Secondary | ICD-10-CM | POA: Diagnosis not present

## 2022-04-19 DIAGNOSIS — N179 Acute kidney failure, unspecified: Secondary | ICD-10-CM | POA: Diagnosis not present

## 2022-04-19 DIAGNOSIS — I959 Hypotension, unspecified: Secondary | ICD-10-CM | POA: Diagnosis present

## 2022-04-19 DIAGNOSIS — D72829 Elevated white blood cell count, unspecified: Secondary | ICD-10-CM | POA: Diagnosis present

## 2022-04-19 DIAGNOSIS — Y832 Surgical operation with anastomosis, bypass or graft as the cause of abnormal reaction of the patient, or of later complication, without mention of misadventure at the time of the procedure: Secondary | ICD-10-CM | POA: Diagnosis present

## 2022-04-19 DIAGNOSIS — K922 Gastrointestinal hemorrhage, unspecified: Secondary | ICD-10-CM | POA: Diagnosis present

## 2022-04-19 DIAGNOSIS — R1084 Generalized abdominal pain: Secondary | ICD-10-CM | POA: Diagnosis not present

## 2022-04-19 DIAGNOSIS — K921 Melena: Secondary | ICD-10-CM | POA: Diagnosis not present

## 2022-04-19 DIAGNOSIS — I509 Heart failure, unspecified: Secondary | ICD-10-CM | POA: Diagnosis not present

## 2022-04-19 DIAGNOSIS — N200 Calculus of kidney: Secondary | ICD-10-CM | POA: Diagnosis not present

## 2022-04-19 DIAGNOSIS — N1832 Chronic kidney disease, stage 3b: Secondary | ICD-10-CM | POA: Diagnosis present

## 2022-04-19 DIAGNOSIS — E1149 Type 2 diabetes mellitus with other diabetic neurological complication: Secondary | ICD-10-CM | POA: Diagnosis not present

## 2022-04-19 DIAGNOSIS — E11319 Type 2 diabetes mellitus with unspecified diabetic retinopathy without macular edema: Secondary | ICD-10-CM | POA: Diagnosis present

## 2022-04-19 DIAGNOSIS — Z794 Long term (current) use of insulin: Secondary | ICD-10-CM | POA: Diagnosis not present

## 2022-04-19 DIAGNOSIS — K284 Chronic or unspecified gastrojejunal ulcer with hemorrhage: Secondary | ICD-10-CM | POA: Diagnosis not present

## 2022-04-19 DIAGNOSIS — R231 Pallor: Secondary | ICD-10-CM | POA: Diagnosis not present

## 2022-04-19 DIAGNOSIS — E1169 Type 2 diabetes mellitus with other specified complication: Secondary | ICD-10-CM | POA: Diagnosis not present

## 2022-04-19 DIAGNOSIS — R58 Hemorrhage, not elsewhere classified: Secondary | ICD-10-CM | POA: Diagnosis not present

## 2022-04-19 DIAGNOSIS — K760 Fatty (change of) liver, not elsewhere classified: Secondary | ICD-10-CM | POA: Diagnosis present

## 2022-04-19 DIAGNOSIS — I110 Hypertensive heart disease with heart failure: Secondary | ICD-10-CM | POA: Diagnosis not present

## 2022-04-19 DIAGNOSIS — I120 Hypertensive chronic kidney disease with stage 5 chronic kidney disease or end stage renal disease: Secondary | ICD-10-CM | POA: Diagnosis not present

## 2022-04-19 DIAGNOSIS — N184 Chronic kidney disease, stage 4 (severe): Secondary | ICD-10-CM | POA: Diagnosis not present

## 2022-04-19 DIAGNOSIS — G629 Polyneuropathy, unspecified: Secondary | ICD-10-CM | POA: Diagnosis not present

## 2022-04-19 DIAGNOSIS — E785 Hyperlipidemia, unspecified: Secondary | ICD-10-CM | POA: Diagnosis present

## 2022-04-19 DIAGNOSIS — C801 Malignant (primary) neoplasm, unspecified: Secondary | ICD-10-CM | POA: Diagnosis not present

## 2022-04-19 DIAGNOSIS — C7800 Secondary malignant neoplasm of unspecified lung: Secondary | ICD-10-CM | POA: Diagnosis present

## 2022-04-19 DIAGNOSIS — K8020 Calculus of gallbladder without cholecystitis without obstruction: Secondary | ICD-10-CM | POA: Diagnosis present

## 2022-04-19 DIAGNOSIS — K9189 Other postprocedural complications and disorders of digestive system: Secondary | ICD-10-CM | POA: Diagnosis present

## 2022-04-19 DIAGNOSIS — G620 Drug-induced polyneuropathy: Secondary | ICD-10-CM | POA: Diagnosis present

## 2022-04-19 LAB — GLUCOSE, CAPILLARY: Glucose-Capillary: 99 mg/dL (ref 70–99)

## 2022-04-19 MED ORDER — FLUDEOXYGLUCOSE F - 18 (FDG) INJECTION
8.6000 | Freq: Once | INTRAVENOUS | Status: AC | PRN
  Administered 2022-04-19: 8.6 via INTRAVENOUS

## 2022-04-21 ENCOUNTER — Inpatient Hospital Stay (HOSPITAL_COMMUNITY): Payer: Medicare PPO | Admitting: Certified Registered Nurse Anesthetist

## 2022-04-21 ENCOUNTER — Other Ambulatory Visit: Payer: Self-pay

## 2022-04-21 ENCOUNTER — Inpatient Hospital Stay (HOSPITAL_COMMUNITY)
Admission: EM | Admit: 2022-04-21 | Discharge: 2022-04-24 | DRG: 393 | Disposition: A | Discharge status: Home / Self Care | Payer: Medicare PPO | Attending: Internal Medicine | Admitting: Internal Medicine

## 2022-04-21 ENCOUNTER — Encounter (HOSPITAL_COMMUNITY): Payer: Self-pay | Admitting: Pharmacy Technician

## 2022-04-21 ENCOUNTER — Encounter (HOSPITAL_COMMUNITY)
Admission: EM | Disposition: A | Discharge status: Home / Self Care | Payer: Self-pay | Source: Home / Self Care | Attending: Internal Medicine

## 2022-04-21 ENCOUNTER — Emergency Department (HOSPITAL_COMMUNITY): Payer: Medicare PPO

## 2022-04-21 DIAGNOSIS — C772 Secondary and unspecified malignant neoplasm of intra-abdominal lymph nodes: Secondary | ICD-10-CM | POA: Diagnosis present

## 2022-04-21 DIAGNOSIS — C50911 Malignant neoplasm of unspecified site of right female breast: Secondary | ICD-10-CM | POA: Diagnosis present

## 2022-04-21 DIAGNOSIS — K760 Fatty (change of) liver, not elsewhere classified: Secondary | ICD-10-CM | POA: Diagnosis present

## 2022-04-21 DIAGNOSIS — I130 Hypertensive heart and chronic kidney disease with heart failure and stage 1 through stage 4 chronic kidney disease, or unspecified chronic kidney disease: Secondary | ICD-10-CM | POA: Diagnosis present

## 2022-04-21 DIAGNOSIS — N184 Chronic kidney disease, stage 4 (severe): Secondary | ICD-10-CM | POA: Diagnosis not present

## 2022-04-21 DIAGNOSIS — N179 Acute kidney failure, unspecified: Secondary | ICD-10-CM | POA: Diagnosis not present

## 2022-04-21 DIAGNOSIS — I700 Atherosclerosis of aorta: Secondary | ICD-10-CM | POA: Diagnosis present

## 2022-04-21 DIAGNOSIS — K283 Acute gastrojejunal ulcer without hemorrhage or perforation: Secondary | ICD-10-CM

## 2022-04-21 DIAGNOSIS — R188 Other ascites: Secondary | ICD-10-CM | POA: Diagnosis present

## 2022-04-21 DIAGNOSIS — G620 Drug-induced polyneuropathy: Secondary | ICD-10-CM | POA: Diagnosis present

## 2022-04-21 DIAGNOSIS — N189 Chronic kidney disease, unspecified: Secondary | ICD-10-CM

## 2022-04-21 DIAGNOSIS — I10 Essential (primary) hypertension: Secondary | ICD-10-CM | POA: Diagnosis not present

## 2022-04-21 DIAGNOSIS — K8020 Calculus of gallbladder without cholecystitis without obstruction: Secondary | ICD-10-CM | POA: Diagnosis present

## 2022-04-21 DIAGNOSIS — Z841 Family history of disorders of kidney and ureter: Secondary | ICD-10-CM

## 2022-04-21 DIAGNOSIS — T380X5A Adverse effect of glucocorticoids and synthetic analogues, initial encounter: Secondary | ICD-10-CM | POA: Diagnosis present

## 2022-04-21 DIAGNOSIS — I110 Hypertensive heart disease with heart failure: Secondary | ICD-10-CM

## 2022-04-21 DIAGNOSIS — Z79899 Other long term (current) drug therapy: Secondary | ICD-10-CM

## 2022-04-21 DIAGNOSIS — Z91010 Allergy to peanuts: Secondary | ICD-10-CM

## 2022-04-21 DIAGNOSIS — Z833 Family history of diabetes mellitus: Secondary | ICD-10-CM

## 2022-04-21 DIAGNOSIS — F419 Anxiety disorder, unspecified: Secondary | ICD-10-CM | POA: Diagnosis present

## 2022-04-21 DIAGNOSIS — D72829 Elevated white blood cell count, unspecified: Secondary | ICD-10-CM

## 2022-04-21 DIAGNOSIS — K9189 Other postprocedural complications and disorders of digestive system: Principal | ICD-10-CM | POA: Diagnosis present

## 2022-04-21 DIAGNOSIS — I120 Hypertensive chronic kidney disease with stage 5 chronic kidney disease or end stage renal disease: Secondary | ICD-10-CM | POA: Diagnosis not present

## 2022-04-21 DIAGNOSIS — E11319 Type 2 diabetes mellitus with unspecified diabetic retinopathy without macular edema: Secondary | ICD-10-CM | POA: Diagnosis present

## 2022-04-21 DIAGNOSIS — Y832 Surgical operation with anastomosis, bypass or graft as the cause of abnormal reaction of the patient, or of later complication, without mention of misadventure at the time of the procedure: Secondary | ICD-10-CM | POA: Diagnosis present

## 2022-04-21 DIAGNOSIS — D62 Acute posthemorrhagic anemia: Secondary | ICD-10-CM | POA: Diagnosis present

## 2022-04-21 DIAGNOSIS — Z923 Personal history of irradiation: Secondary | ICD-10-CM

## 2022-04-21 DIAGNOSIS — E1142 Type 2 diabetes mellitus with diabetic polyneuropathy: Secondary | ICD-10-CM | POA: Diagnosis present

## 2022-04-21 DIAGNOSIS — Z794 Long term (current) use of insulin: Secondary | ICD-10-CM

## 2022-04-21 DIAGNOSIS — K921 Melena: Secondary | ICD-10-CM | POA: Diagnosis not present

## 2022-04-21 DIAGNOSIS — N1832 Chronic kidney disease, stage 3b: Secondary | ICD-10-CM | POA: Diagnosis present

## 2022-04-21 DIAGNOSIS — E785 Hyperlipidemia, unspecified: Secondary | ICD-10-CM | POA: Diagnosis present

## 2022-04-21 DIAGNOSIS — K289 Gastrojejunal ulcer, unspecified as acute or chronic, without hemorrhage or perforation: Secondary | ICD-10-CM | POA: Diagnosis not present

## 2022-04-21 DIAGNOSIS — K922 Gastrointestinal hemorrhage, unspecified: Secondary | ICD-10-CM | POA: Diagnosis present

## 2022-04-21 DIAGNOSIS — F32A Depression, unspecified: Secondary | ICD-10-CM | POA: Diagnosis present

## 2022-04-21 DIAGNOSIS — C787 Secondary malignant neoplasm of liver and intrahepatic bile duct: Secondary | ICD-10-CM | POA: Diagnosis present

## 2022-04-21 DIAGNOSIS — K280 Acute gastrojejunal ulcer with hemorrhage: Secondary | ICD-10-CM | POA: Diagnosis present

## 2022-04-21 DIAGNOSIS — Z8249 Family history of ischemic heart disease and other diseases of the circulatory system: Secondary | ICD-10-CM

## 2022-04-21 DIAGNOSIS — Z91048 Other nonmedicinal substance allergy status: Secondary | ICD-10-CM

## 2022-04-21 DIAGNOSIS — E875 Hyperkalemia: Secondary | ICD-10-CM | POA: Diagnosis not present

## 2022-04-21 DIAGNOSIS — E1149 Type 2 diabetes mellitus with other diabetic neurological complication: Secondary | ICD-10-CM | POA: Diagnosis not present

## 2022-04-21 DIAGNOSIS — I509 Heart failure, unspecified: Secondary | ICD-10-CM

## 2022-04-21 DIAGNOSIS — G629 Polyneuropathy, unspecified: Secondary | ICD-10-CM | POA: Diagnosis not present

## 2022-04-21 DIAGNOSIS — E1140 Type 2 diabetes mellitus with diabetic neuropathy, unspecified: Secondary | ICD-10-CM

## 2022-04-21 DIAGNOSIS — Z9221 Personal history of antineoplastic chemotherapy: Secondary | ICD-10-CM

## 2022-04-21 DIAGNOSIS — E1122 Type 2 diabetes mellitus with diabetic chronic kidney disease: Secondary | ICD-10-CM | POA: Diagnosis present

## 2022-04-21 DIAGNOSIS — E119 Type 2 diabetes mellitus without complications: Secondary | ICD-10-CM

## 2022-04-21 DIAGNOSIS — Z886 Allergy status to analgesic agent status: Secondary | ICD-10-CM

## 2022-04-21 DIAGNOSIS — K284 Chronic or unspecified gastrojejunal ulcer with hemorrhage: Secondary | ICD-10-CM | POA: Diagnosis not present

## 2022-04-21 DIAGNOSIS — R5381 Other malaise: Secondary | ICD-10-CM | POA: Diagnosis present

## 2022-04-21 DIAGNOSIS — C50411 Malignant neoplasm of upper-outer quadrant of right female breast: Secondary | ICD-10-CM | POA: Diagnosis present

## 2022-04-21 DIAGNOSIS — I5032 Chronic diastolic (congestive) heart failure: Secondary | ICD-10-CM | POA: Diagnosis present

## 2022-04-21 DIAGNOSIS — E1129 Type 2 diabetes mellitus with other diabetic kidney complication: Secondary | ICD-10-CM | POA: Diagnosis not present

## 2022-04-21 DIAGNOSIS — K920 Hematemesis: Secondary | ICD-10-CM

## 2022-04-21 DIAGNOSIS — R591 Generalized enlarged lymph nodes: Secondary | ICD-10-CM | POA: Diagnosis not present

## 2022-04-21 DIAGNOSIS — Z9011 Acquired absence of right breast and nipple: Secondary | ICD-10-CM

## 2022-04-21 DIAGNOSIS — C7800 Secondary malignant neoplasm of unspecified lung: Secondary | ICD-10-CM | POA: Diagnosis present

## 2022-04-21 DIAGNOSIS — M1990 Unspecified osteoarthritis, unspecified site: Secondary | ICD-10-CM | POA: Diagnosis present

## 2022-04-21 DIAGNOSIS — E1169 Type 2 diabetes mellitus with other specified complication: Secondary | ICD-10-CM | POA: Diagnosis not present

## 2022-04-21 DIAGNOSIS — Z9884 Bariatric surgery status: Secondary | ICD-10-CM | POA: Diagnosis not present

## 2022-04-21 DIAGNOSIS — R7989 Other specified abnormal findings of blood chemistry: Secondary | ICD-10-CM | POA: Diagnosis not present

## 2022-04-21 DIAGNOSIS — T451X5A Adverse effect of antineoplastic and immunosuppressive drugs, initial encounter: Secondary | ICD-10-CM | POA: Diagnosis present

## 2022-04-21 DIAGNOSIS — Z7982 Long term (current) use of aspirin: Secondary | ICD-10-CM

## 2022-04-21 DIAGNOSIS — E1165 Type 2 diabetes mellitus with hyperglycemia: Secondary | ICD-10-CM

## 2022-04-21 DIAGNOSIS — Z803 Family history of malignant neoplasm of breast: Secondary | ICD-10-CM

## 2022-04-21 DIAGNOSIS — I959 Hypotension, unspecified: Secondary | ICD-10-CM | POA: Diagnosis present

## 2022-04-21 DIAGNOSIS — Z888 Allergy status to other drugs, medicaments and biological substances status: Secondary | ICD-10-CM

## 2022-04-21 DIAGNOSIS — I2510 Atherosclerotic heart disease of native coronary artery without angina pectoris: Secondary | ICD-10-CM | POA: Diagnosis present

## 2022-04-21 HISTORY — PX: ESOPHAGOGASTRODUODENOSCOPY (EGD) WITH PROPOFOL: SHX5813

## 2022-04-21 HISTORY — PX: HEMOSTASIS CONTROL: SHX6838

## 2022-04-21 LAB — CBC
HCT: 20.9 % — ABNORMAL LOW (ref 36.0–46.0)
HCT: 27.9 % — ABNORMAL LOW (ref 36.0–46.0)
Hemoglobin: 6.4 g/dL — CL (ref 12.0–15.0)
Hemoglobin: 9.1 g/dL — ABNORMAL LOW (ref 12.0–15.0)
MCH: 30.9 pg (ref 26.0–34.0)
MCH: 31.5 pg (ref 26.0–34.0)
MCHC: 30.6 g/dL (ref 30.0–36.0)
MCHC: 32.6 g/dL (ref 30.0–36.0)
MCV: 101 fL — ABNORMAL HIGH (ref 80.0–100.0)
MCV: 96.5 fL (ref 80.0–100.0)
Platelets: 281 10*3/uL (ref 150–400)
Platelets: 274 10*3/uL (ref 150–400)
RBC: 2.07 MIL/uL — ABNORMAL LOW (ref 3.87–5.11)
RBC: 2.89 MIL/uL — ABNORMAL LOW (ref 3.87–5.11)
RDW: 17.5 % — ABNORMAL HIGH (ref 11.5–15.5)
RDW: 16.1 % — ABNORMAL HIGH (ref 11.5–15.5)
WBC: 34.2 10*3/uL — ABNORMAL HIGH (ref 4.0–10.5)
WBC: 36.4 10*3/uL — ABNORMAL HIGH (ref 4.0–10.5)
nRBC: 0.4 % — ABNORMAL HIGH (ref 0.0–0.2)
nRBC: 0.5 % — ABNORMAL HIGH (ref 0.0–0.2)

## 2022-04-21 LAB — COMPREHENSIVE METABOLIC PANEL
ALT: 22 U/L (ref 0–44)
AST: 59 U/L — ABNORMAL HIGH (ref 15–41)
Albumin: 2.7 g/dL — ABNORMAL LOW (ref 3.5–5.0)
Alkaline Phosphatase: 214 U/L — ABNORMAL HIGH (ref 38–126)
Anion gap: 11 (ref 5–15)
BUN: 62 mg/dL — ABNORMAL HIGH (ref 8–23)
CO2: 19 mmol/L — ABNORMAL LOW (ref 22–32)
Calcium: 8.7 mg/dL — ABNORMAL LOW (ref 8.9–10.3)
Chloride: 105 mmol/L (ref 98–111)
Creatinine, Ser: 2.2 mg/dL — ABNORMAL HIGH (ref 0.44–1.00)
GFR, Estimated: 24 mL/min — ABNORMAL LOW (ref >60)
Glucose, Bld: 206 mg/dL — ABNORMAL HIGH (ref 70–99)
Potassium: 4.7 mmol/L (ref 3.5–5.1)
Sodium: 135 mmol/L (ref 135–145)
Total Bilirubin: 0.7 mg/dL (ref 0.3–1.2)
Total Protein: 5 g/dL — ABNORMAL LOW (ref 6.5–8.1)

## 2022-04-21 LAB — PREPARE RBC (CROSSMATCH)

## 2022-04-21 LAB — HEMOGLOBIN AND HEMATOCRIT, BLOOD
HCT: 27.8 % — ABNORMAL LOW (ref 36.0–46.0)
Hemoglobin: 9 g/dL — ABNORMAL LOW (ref 12.0–15.0)

## 2022-04-21 LAB — GLUCOSE, CAPILLARY
Glucose-Capillary: 101 mg/dL — ABNORMAL HIGH (ref 70–99)
Glucose-Capillary: 200 mg/dL — ABNORMAL HIGH (ref 70–99)

## 2022-04-21 SURGERY — ESOPHAGOGASTRODUODENOSCOPY (EGD) WITH PROPOFOL
Anesthesia: General

## 2022-04-21 MED ORDER — OCTREOTIDE LOAD VIA INFUSION
100.0000 ug | Freq: Once | INTRAVENOUS | Status: AC
  Administered 2022-04-21: 100 ug via INTRAVENOUS
  Filled 2022-04-21: qty 50

## 2022-04-21 MED ORDER — ACETAMINOPHEN 325 MG PO TABS: 650.0000 mg | ORAL_TABLET | Freq: Four times a day (QID) | ORAL | Status: DC | PRN

## 2022-04-21 MED ORDER — BEMPEDOIC ACID-EZETIMIBE 180-10 MG PO TABS: 1.0000 | ORAL_TABLET | Freq: Every day | ORAL | Status: DC

## 2022-04-21 MED ORDER — INSULIN GLARGINE-YFGN 100 UNIT/ML ~~LOC~~ SOLN
5.0000 [IU] | Freq: Every day | SUBCUTANEOUS | Status: DC
  Administered 2022-04-21 – 2022-04-24 (×4): 5 [IU] via SUBCUTANEOUS
  Filled 2022-04-21 (×4): qty 0.05

## 2022-04-21 MED ORDER — ONDANSETRON HCL 4 MG/2ML IJ SOLN
4.0000 mg | Freq: Four times a day (QID) | INTRAMUSCULAR | Status: DC | PRN
  Administered 2022-04-23 – 2022-04-24 (×2): 4 mg via INTRAVENOUS
  Filled 2022-04-21 (×2): qty 2

## 2022-04-21 MED ORDER — TRAZODONE HCL 50 MG PO TABS
25.0000 mg | ORAL_TABLET | Freq: Every evening | ORAL | Status: DC | PRN
  Administered 2022-04-21: 25 mg via ORAL
  Filled 2022-04-21: qty 1

## 2022-04-21 MED ORDER — SODIUM CHLORIDE 0.9% IV SOLUTION: Freq: Once | INTRAVENOUS | Status: DC

## 2022-04-21 MED ORDER — PROPOFOL 10 MG/ML IV BOLUS
INTRAVENOUS | Status: AC
  Filled 2022-04-21: qty 20

## 2022-04-21 MED ORDER — SODIUM CHLORIDE 0.9 % IV SOLN
50.0000 ug/h | INTRAVENOUS | Status: DC
  Administered 2022-04-21: 50 ug/h via INTRAVENOUS
  Filled 2022-04-21: qty 1

## 2022-04-21 MED ORDER — LORATADINE 10 MG PO TABS
10.0000 mg | ORAL_TABLET | Freq: Every day | ORAL | Status: DC
  Administered 2022-04-21 – 2022-04-23 (×3): 10 mg via ORAL
  Filled 2022-04-21 (×4): qty 1

## 2022-04-21 MED ORDER — DOCUSATE SODIUM 100 MG PO CAPS
100.0000 mg | ORAL_CAPSULE | Freq: Two times a day (BID) | ORAL | Status: DC
  Administered 2022-04-22 – 2022-04-23 (×2): 100 mg via ORAL
  Filled 2022-04-21 (×4): qty 1

## 2022-04-21 MED ORDER — VORTIOXETINE HBR 5 MG PO TABS
20.0000 mg | ORAL_TABLET | Freq: Every day | ORAL | Status: DC
  Administered 2022-04-21 – 2022-04-22 (×2): 20 mg via ORAL
  Filled 2022-04-21 (×3): qty 4

## 2022-04-21 MED ORDER — FENTANYL CITRATE (PF) 100 MCG/2ML IJ SOLN
INTRAMUSCULAR | Status: DC | PRN
  Administered 2022-04-21: 50 ug via INTRAVENOUS

## 2022-04-21 MED ORDER — SUCCINYLCHOLINE CHLORIDE 200 MG/10ML IV SOSY
PREFILLED_SYRINGE | INTRAVENOUS | Status: DC | PRN
  Administered 2022-04-21: 120 mg via INTRAVENOUS

## 2022-04-21 MED ORDER — PHENYLEPHRINE 80 MCG/ML (10ML) SYRINGE FOR IV PUSH (FOR BLOOD PRESSURE SUPPORT)
PREFILLED_SYRINGE | INTRAVENOUS | Status: DC | PRN
  Administered 2022-04-21: 200 ug via INTRAVENOUS

## 2022-04-21 MED ORDER — SODIUM CHLORIDE 0.9 % IV SOLN: INTRAVENOUS | Status: DC

## 2022-04-21 MED ORDER — POLYVINYL ALCOHOL 1.4 % OP SOLN: Freq: Three times a day (TID) | OPHTHALMIC | Status: DC | PRN

## 2022-04-21 MED ORDER — LIDOCAINE 2% (20 MG/ML) 5 ML SYRINGE
INTRAMUSCULAR | Status: DC | PRN
  Administered 2022-04-21: 40 mg via INTRAVENOUS

## 2022-04-21 MED ORDER — ONDANSETRON HCL 4 MG/2ML IJ SOLN
INTRAMUSCULAR | Status: DC | PRN
  Administered 2022-04-21: 4 mg via INTRAVENOUS

## 2022-04-21 MED ORDER — POTASSIUM CHLORIDE IN NACL 20-0.9 MEQ/L-% IV SOLN
INTRAVENOUS | Status: DC
  Filled 2022-04-21: qty 1000

## 2022-04-21 MED ORDER — PROPOFOL 10 MG/ML IV BOLUS
INTRAVENOUS | Status: DC | PRN
  Administered 2022-04-21: 20 mg via INTRAVENOUS
  Administered 2022-04-21: 100 mg via INTRAVENOUS

## 2022-04-21 MED ORDER — ACETAMINOPHEN 650 MG RE SUPP: 650.0000 mg | Freq: Four times a day (QID) | RECTAL | Status: DC | PRN

## 2022-04-21 MED ORDER — ONDANSETRON HCL 4 MG PO TABS: 4.0000 mg | ORAL_TABLET | Freq: Four times a day (QID) | ORAL | Status: DC | PRN

## 2022-04-21 MED ORDER — CHLORHEXIDINE GLUCONATE CLOTH 2 % EX PADS
6.0000 | MEDICATED_PAD | Freq: Every day | CUTANEOUS | Status: DC
  Administered 2022-04-21 – 2022-04-24 (×4): 6 via TOPICAL

## 2022-04-21 MED ORDER — SODIUM CHLORIDE 0.9 % IV BOLUS
1000.0000 mL | Freq: Once | INTRAVENOUS | Status: AC
  Administered 2022-04-21: 1000 mL via INTRAVENOUS

## 2022-04-21 MED ORDER — SODIUM CHLORIDE 0.9% FLUSH: 10.0000 mL | INTRAVENOUS | Status: DC | PRN

## 2022-04-21 MED ORDER — SODIUM CHLORIDE 0.9% FLUSH
10.0000 mL | Freq: Two times a day (BID) | INTRAVENOUS | Status: DC
  Administered 2022-04-22: 10 mL

## 2022-04-21 MED ORDER — BUPROPION HCL ER (SR) 100 MG PO TB12
200.0000 mg | ORAL_TABLET | Freq: Every day | ORAL | Status: DC
  Administered 2022-04-21 – 2022-04-24 (×4): 200 mg via ORAL
  Filled 2022-04-21 (×4): qty 2

## 2022-04-21 MED ORDER — FENTANYL CITRATE (PF) 100 MCG/2ML IJ SOLN
INTRAMUSCULAR | Status: AC
  Filled 2022-04-21: qty 2

## 2022-04-21 MED ORDER — PANTOPRAZOLE 80MG IVPB - SIMPLE MED
80.0000 mg | Freq: Once | INTRAVENOUS | Status: AC
  Administered 2022-04-21: 80 mg via INTRAVENOUS
  Filled 2022-04-21: qty 80

## 2022-04-21 MED ORDER — PANTOPRAZOLE INFUSION (NEW) - SIMPLE MED
8.0000 mg/h | INTRAVENOUS | Status: DC
  Administered 2022-04-21 – 2022-04-23 (×6): 8 mg/h via INTRAVENOUS
  Filled 2022-04-21 (×2): qty 80
  Filled 2022-04-21 (×2): qty 100
  Filled 2022-04-21: qty 80
  Filled 2022-04-21: qty 100
  Filled 2022-04-21 (×3): qty 80

## 2022-04-21 SURGICAL SUPPLY — 15 items

## 2022-04-21 NOTE — Progress Notes (Addendum)
The patient arrived to the unit. She is alert oriented x4. Left chest port has been accessed prior to arrival. Dressing is not intact. On arrival Octreotide 4ml/hr  infusing in proximal connection and Protonix 10 ml/hr infusing distal connection. Skin is pale, dry intact. Callous to right great and left great toes. Report received from Endo RN whom stated second unit of PRBCs ended at 1620. Phlebotomist at bedside CBC rescheduled for 1815.Endo RN also stated that there was a third unit PRBCs  to be transfused only if HGB is 7 or below.

## 2022-04-21 NOTE — ED Triage Notes (Signed)
Pt bib ems from home with reports of copious amounts of rectal bleeding onset this morning. Pt also with emesis of BRB X1 with ems. Initial BP [id number] systolic. Pt given 350cc NS en route with improvement to 90 systolic. Pt receiving tx for CA that has spread to her liver and lungs. Pt endorses feeling dizzy and weak for the last few days. Not currently on anticoags.

## 2022-04-21 NOTE — Anesthesia Procedure Notes (Signed)
Procedure Name: Intubation Date/Time: 04/21/2022 3:48 PM  Performed by: West Pugh, CRNAPre-anesthesia Checklist: Patient identified, Emergency Drugs available, Suction available, Patient being monitored and Timeout performed Patient Re-evaluated:Patient Re-evaluated prior to induction Oxygen Delivery Method: Circle system utilized Preoxygenation: Pre-oxygenation with 100% oxygen Induction Type: IV induction Laryngoscope Size: Mac and 3 Grade View: Grade I Tube type: Oral Tube size: 7.0 mm Number of attempts: 1 Airway Equipment and Method: Stylet Placement Confirmation: ETT inserted through vocal cords under direct vision, positive ETCO2, CO2 detector and breath sounds checked- equal and bilateral Secured at: 21 cm Tube secured with: Tape Dental Injury: Teeth and Oropharynx as per pre-operative assessment

## 2022-04-21 NOTE — Progress Notes (Signed)
Pharmacist contacted regarding Octreotide and Protonix compatibility. Verified as compatible. Medications have been infusing prior to arrival to unit. Tubing is patent clear. Pt tolerating well.

## 2022-04-21 NOTE — Addendum Note (Signed)
Addendum  created 04/21/22 1750 by West Pugh, CRNA   Flowsheet accepted

## 2022-04-21 NOTE — Anesthesia Postprocedure Evaluation (Signed)
Anesthesia Post Note  Patient: Darlene [name]  Procedure(s) Performed: ESOPHAGOGASTRODUODENOSCOPY (EGD) WITH PROPOFOL HEMOSTASIS CONTROL     Patient location during evaluation: PACU Anesthesia Type: General Level of consciousness: awake and alert, oriented and patient cooperative Pain management: pain level controlled Vital Signs Assessment: post-procedure vital signs reviewed and stable Respiratory status: spontaneous breathing, nonlabored ventilation and respiratory function stable Cardiovascular status: blood pressure returned to baseline and stable Postop Assessment: no apparent nausea or vomiting Anesthetic complications: no   No notable events documented.  Last Vitals:  Vitals:   04/21/22 1538 04/21/22 1611  BP: (!) 117/44 (!) 126/46  Pulse: 82 92  Resp: 11 16  Temp: 36.6 C 36.8 C  SpO2: 96% 100%    Last Pain:  Vitals:   04/21/22 1611  TempSrc: Temporal  PainSc: 0-No pain                 Pervis Hocking

## 2022-04-21 NOTE — Transfer of Care (Signed)
Immediate Anesthesia Transfer of Care Note  Patient: Darlene [name]  Procedure(s) Performed: ESOPHAGOGASTRODUODENOSCOPY (EGD) WITH PROPOFOL HEMOSTASIS CONTROL  Patient Location: PACU and Endoscopy Unit  Anesthesia Type:General  Level of Consciousness: awake and patient cooperative  Airway & Oxygen Therapy: Patient Spontanous Breathing and Patient connected to face mask oxygen  Post-op Assessment: Report given to RN and Post -op Vital signs reviewed and stable  Post vital signs: Reviewed and stable  Last Vitals:  Vitals Value Taken Time  BP 126/46 04/21/22 1611  Temp 36.8 C 04/21/22 1611  Pulse 93 04/21/22 1617  Resp 20 04/21/22 1617  SpO2 96 % 04/21/22 1617  Vitals shown include unvalidated device data.  Last Pain:  Vitals:   04/21/22 1611  TempSrc: Temporal  PainSc: 0-No pain         Complications: No notable events documented.

## 2022-04-21 NOTE — Op Note (Signed)
[facility] Patient Name: Darlene [name] Procedure Date: 04/21/2022 MRN: [id number] Attending MD: Jerene Bears , MD, [id number] Date of Birth: Aug 08, 1950 CSN: [id number] Age: 72 Admit Type: Outpatient Procedure:                Upper GI endoscopy Indications:              Active gastrointestinal bleeding with hematemesis                            and hematochezia, acute blood loss anemia Providers:                Lajuan Lines. Hilarie Fredrickson, MD, Jaci Carrel, RN, William Dalton, Technician Referring MD:             [facility] Medicines:                General Anesthesia Complications:            No immediate complications. Estimated Blood Loss:     Estimated blood loss: none. Procedure:                Pre-Anesthesia Assessment:                           - Prior to the procedure, a History and Physical                            was performed, and patient medications and                            allergies were reviewed. The patient's tolerance of                            previous anesthesia was also reviewed. The risks                            and benefits of the procedure and the sedation                            options and risks were discussed with the patient.                            All questions were answered, and informed consent                            was obtained. Prior Anticoagulants: The patient has                            taken no anticoagulant or antiplatelet agents. ASA                            Grade Assessment: III - A patient with severe  systemic disease. After reviewing the risks and                            benefits, the patient was deemed in satisfactory                            condition to undergo the procedure.                           After obtaining informed consent, the endoscope was                            passed under direct vision. Throughout the                             procedure, the patient's blood pressure, pulse, and                            oxygen saturations were monitored continuously. The                            [id number] Olympus endoscope was introduced                            through the mouth, and advanced to the efferent                            jejunal loop. The upper GI endoscopy was                            accomplished without difficulty. The patient                            tolerated the procedure well. Scope In: Scope Out: Findings:      The examined esophagus was normal.      Evidence of a Roux-en-Y gastrojejunostomy was found. The gastrojejunal       anastomosis was characterized by a large ulceration with small pigmented       spot. The ulcer is located on the jejunal side and then is a small       linear clean-based ulcer across on wall opposite the large ulcer. For       hemostasis, hemostatic gel was deployed. Gel was placed across the       entire ulcer bed. There was no bleeding at the end of the maneuver.      The gastric pouch appears normal.      The examined afferent jejunal limb is long and was normal. The examined       efferent jejunal limb was normal. Impression:               - Normal esophagus.                           - Roux-en-Y anatomy.                           -  Large anastomotic ulcer with pigmented spot                            (jejunal side at mouth of afferent and efferent                            limbs). Hemostatic gel (PuraStat) applied. Likely                            steroid induced.                           - Unremarkable gastric pouch.                           - Otherwise normal examined jejunal limbs.                           - No specimens collected. Moderate Sedation:      Not Applicable - Patient had care per Anesthesia. Recommendation:           - Return patient to hospital ward for ongoing care.                           - Clear  liquid diet.                           - Continue present medications. Continue PPI                            infusion. Can stop octreotide infusion. When                            converted to PO BID PPI use capsule form                            (omeprazole 40 mg or lansoprazole 30 mg) and open                            capsule and administer granules with applesauce to                            enhance absorption given gastric bypass anatomy.                           - Will stop with 2 unit of pRBCs and check                            post-transfusion Hgb. Transfuse as needed for goal                            Hgb 7.0 or >.                           - GI service will check on patient tomorrow. Procedure  Code(s):        --- Professional ---                           [phone], Esophagogastroduodenoscopy, flexible,                            transoral; with control of bleeding, any method Diagnosis Code(s):        --- Professional ---                           Z98.0, Intestinal bypass and anastomosis status                           K92.2, Gastrointestinal hemorrhage, unspecified CPT copyright 2022 American Medical Association. All rights reserved. The codes documented in this report are preliminary and upon coder review may  be revised to meet current compliance requirements. Jerene Bears, MD 04/21/2022 4:14:28 PM This report has been signed electronically. Number of Addenda: 0

## 2022-04-21 NOTE — Anesthesia Preprocedure Evaluation (Addendum)
Anesthesia Evaluation  Patient identified by MRN, date of birth, ID band Patient awake    Reviewed: Allergy & Precautions, NPO status , Patient's Chart, lab work & pertinent test results  History of Anesthesia Complications Negative for: history of anesthetic complications  Airway Mallampati: I  TM Distance: >3 FB Neck ROM: Full    Dental  (+) Teeth Intact, Dental Advisory Given   Pulmonary sleep apnea    breath sounds clear to auscultation       Cardiovascular hypertension, Pt. on medications +CHF   Rhythm:Regular  1. Left ventricular ejection fraction, by estimation, is 55 to 60%. The  left ventricle has normal function. The left ventricle has no regional  wall motion abnormalities. Left ventricular diastolic parameters are  consistent with Grade I diastolic  dysfunction (impaired relaxation). The average left ventricular global  longitudinal strain is -17.7 %.   2. Right ventricular systolic function is normal. The right ventricular  size is normal. Tricuspid regurgitation signal is inadequate for assessing  PA pressure.   3. The mitral valve is normal in structure. No evidence of mitral valve  regurgitation. No evidence of mitral stenosis.   4. The aortic valve is tricuspid. Aortic valve regurgitation is trivial.  No aortic stenosis is present.   5. The inferior vena cava is normal in size with greater than 50%  respiratory variability, suggesting right atrial pressure of 3 mmHg.    Neg stress 2021    Neuro/Psych  PSYCHIATRIC DISORDERS Anxiety Depression     Neuromuscular disease    GI/Hepatic Acute GI bleed   Previous gastric bypass   Endo/Other  diabetes, Type 2  Lab Results      Component                Value               Date                      HGBA1C                   7.4 (H)             12/19/2021             Renal/GU ARFRenal diseaseLab Results      Component                Value               Date                       CREATININE               2.20 (H)            04/21/2022                Musculoskeletal  (+) Arthritis ,    Abdominal   Peds  Hematology  (+) Blood dyscrasia, anemia Lab Results      Component                Value               Date                      WBC                      34.2 (H)  04/21/2022                HGB                      6.4 (LL)            04/21/2022                HCT                      20.9 (L)            04/21/2022                MCV                      101.0 (H)           04/21/2022                PLT                      281                 04/21/2022              Anesthesia Other Findings   Reproductive/Obstetrics                             Anesthesia Physical Anesthesia Plan  ASA: 4 and emergent  Anesthesia Plan: General   Post-op Pain Management: Minimal or no pain anticipated   Induction: Intravenous, Rapid sequence and Cricoid pressure planned  PONV Risk Score and Plan: 3 and Ondansetron and Dexamethasone  Airway Management Planned: Oral ETT  Additional Equipment:   Intra-op Plan:   Post-operative Plan: Extubation in OR and Possible Post-op intubation/ventilation  Informed Consent: I have reviewed the patients History and Physical, chart, labs and discussed the procedure including the risks, benefits and alternatives for the proposed anesthesia with the patient or authorized representative who has indicated his/her understanding and acceptance.     Dental advisory given  Plan Discussed with: CRNA  Anesthesia Plan Comments:         Anesthesia Quick Evaluation

## 2022-04-21 NOTE — ED Provider Notes (Signed)
[facility] Provider Note   CSN: [id number] Arrival date & time: 04/21/22  [id number]     History  Chief Complaint  Patient presents with   GI Bleeding    Darlene [name] is a 72 y.o. female.  HPI   72 year old female with medical history significant for DM 2, depression, peripheral neuropathy, HTN, CKD, metastatic breast cancer with mets to the liver who presents to the emergency department with hematemesis and hematochezia.  The patient noticed a dark stool mixed in with her bowel movement last night.  This morning she had an episode of hematemesis.  She also had about 4 bloody bowel movements this morning.  She feels weak and fatigued and lightheaded.  Initial blood pressure was 70 systolic with EMS and the patient was administered 350 cc of normal saline and route with improvement to 90 systolic.  She states that she has been receiving chemotherapy for her metastatic breast cancer.  She endorses feeling significantly fatigued and lightheaded.  She is not on anticoagulation.  Endorses discomfort in the right upper quadrant of her abdomen.  Home Medications Prior to Admission medications   Medication Sig Start Date End Date Taking? Authorizing Provider  aspirin EC 81 MG tablet Take 1 tablet (81 mg total) by mouth daily. 03/18/19  Yes Larey Dresser, MD  Bempedoic Acid-Ezetimibe (NEXLIZET) 180-10 MG TABS Take 1 tablet by mouth daily. 08/08/21  Yes Larey Dresser, MD  buPROPion [facility] SR) 200 MG 12 hr tablet Take one tablet by mouth once daily. Patient taking differently: Take 200 mg by mouth daily. Take one tablet by mouth once daily. 02/26/22  Yes Beasley, Caren D, MD  CALCIUM CITRATE-VITAMIN D PO Take 1 tablet by mouth 3 (three) times daily.    Yes [provider]  carvedilol (COREG) 6.25 MG tablet Take 1 tablet (6.25 mg total) by mouth 2 (two) times daily. 11/02/21  Yes Larey Dresser, MD  cholecalciferol (VITAMIN D)  1000 units tablet Take 1,000 Units by mouth daily.   Yes [provider]  Cyanocobalamin (VITAMIN B-12 PO) Take 1 tablet by mouth every 14 (fourteen) days.   Yes [provider]  insulin aspart (NOVOLOG) 100 UNIT/ML injection Inject into the skin daily as needed for high blood sugar. Per patient, sliding scale with meals as needed   Yes [provider]  insulin glargine (LANTUS SOLOSTAR) 100 UNIT/ML Solostar Pen Inject 8 Units into the skin daily. Patient taking differently: Inject 11 Units into the skin daily. 10/05/20  Yes Beasley, Caren D, MD  KERENDIA 20 MG TABS TAKE 1 TABLET EVERY DAY Patient taking differently: Take 20 mg by mouth daily. 01/22/22  Yes Larey Dresser, MD  Lactobacillus ([employer] ACIDOPHILUS PROBIOTIC) 0.5 MG TABS Take 1 tablet by mouth daily. 10/19/21  Yes Nicholas Lose, MD  loperamide (IMODIUM) 2 MG capsule Take 2 mg by mouth as needed for diarrhea or loose stools.   Yes [provider]  loratadine (CLARITIN) 10 MG tablet Take 10 mg by mouth daily.   Yes [provider]  melatonin 5 MG TABS Take 5 mg by mouth at bedtime.   Yes [provider]  Multiple Vitamins-Minerals (OPURITY) TABS Take 1 tablet by mouth 2 (two) times daily.   Yes [provider]  ondansetron (ZOFRAN) 8 MG tablet Take 1 tablet (8 mg total) by mouth every 8 (eight) hours as needed for nausea or vomiting. 03/21/22  Yes Nicholas Lose, MD  polyethylene  glycol (MIRALAX / GLYCOLAX) 17 g packet Take 17 g by mouth daily as needed for moderate constipation.   Yes [provider]  prochlorperazine (COMPAZINE) 10 MG tablet Take 1 tablet (10 mg total) by mouth every 6 (six) hours as needed (Nausea or vomiting). 03/21/22  Yes Nicholas Lose, MD  Propylene Glycol (SYSTANE BALANCE OP) Place 1-2 drops into both eyes 3 (three) times daily as needed (for dry eyes).    Yes [provider]  tobramycin (TOBREX) 0.3 % ophthalmic ointment Place 1 application  into both eyes as needed (Takes day before eye injections).   Yes [provider]  vortioxetine HBr (TRINTELLIX) 20 MG TABS tablet Take 1 tablet (20 mg total) by mouth at bedtime. 02/26/22  Yes Beasley, Caren D, MD  Accu-Chek Softclix Lancets lancets 1 each by Other route 4 (four) times daily. Test blood sugars 4 x times daily 05/08/21   Dennard Nip D, MD  Blood Glucose Calibration (ACCU-CHEK GUIDE CONTROL) LIQD 1 Bottle by In Vitro route as needed. 04/15/19   Dennard Nip D, MD  Blood Glucose Monitoring Suppl (ACCU-CHEK GUIDE) w/Device KIT 1 each by Does not apply route 4 (four) times daily. Use to test blood sugars 4x a day 03/08/19   Dennard Nip D, MD  glucose blood (ACCU-CHEK GUIDE) test strip Use Accu Chek test strips to check blood sugar 4 times daily. DX:E11.21 02/06/22   Nicholas Lose, MD  SURE COMFORT PEN NEEDLES 32G X 4 MM MISC Use as instructed to inject insulin and Victoza daily. 09/21/20   Dennard Nip D, MD      Allergies    Antihistamines, diphenhydramine-type; Nsaids; Peanut allergen powder-dnfp; and Tape    Review of Systems   Review of Systems  All other systems reviewed and are negative.   Physical Exam Updated Vital Signs BP (!) 114/49   Pulse 75   Temp 98.1 F (36.7 C)   Resp 12   LMP 10/22/2007   SpO2 99%  Physical Exam Vitals and nursing note reviewed.  Constitutional:      General: She is not in acute distress.    Appearance: She is well-developed.  HENT:     Head: Normocephalic and atraumatic.  Eyes:     Conjunctiva/sclera: Conjunctivae normal.  Cardiovascular:     Rate and Rhythm: Normal rate and regular rhythm.  Pulmonary:     Effort: Pulmonary effort is normal. No respiratory distress.     Breath sounds: Normal breath sounds.  Abdominal:     Palpations: Abdomen is soft.     Tenderness: There is abdominal tenderness in the right upper quadrant.  Musculoskeletal:        General: No swelling.     Cervical back: Neck supple.  Skin:     General: Skin is warm and dry.     Capillary Refill: Capillary refill takes less than 2 seconds.     Coloration: Skin is pale.  Neurological:     Mental Status: She is alert.  Psychiatric:        Mood and Affect: Mood normal.     ED Results / Procedures / Treatments   Labs (all labs ordered are listed, but only abnormal results are displayed) Labs Reviewed  COMPREHENSIVE METABOLIC PANEL - Abnormal; Notable for the following components:      Result Value   CO2 19 (*)    Glucose, Bld 206 (*)    BUN 62 (*)    Creatinine, Ser 2.20 (*)    Calcium  8.7 (*)    Total Protein 5.0 (*)    Albumin 2.7 (*)    AST 59 (*)    Alkaline Phosphatase 214 (*)    GFR, Estimated 24 (*)    All other components within normal limits  CBC - Abnormal; Notable for the following components:   WBC 34.2 (*)    RBC 2.07 (*)    Hemoglobin 6.4 (*)    HCT 20.9 (*)    MCV 101.0 (*)    RDW 17.5 (*)    nRBC 0.4 (*)    All other components within normal limits  PROTIME-INR  POC OCCULT BLOOD, ED  TYPE AND SCREEN  PREPARE RBC (CROSSMATCH)  PREPARE RBC (CROSSMATCH)    EKG None  Radiology CT ABDOMEN PELVIS WO CONTRAST  Result Date: 04/21/2022 CLINICAL DATA:  GI bleeding. History of metastatic breast cancer. * Tracking Code: BO * EXAM: CT ABDOMEN AND PELVIS WITHOUT CONTRAST TECHNIQUE: Multidetector CT imaging of the abdomen and pelvis was performed following the standard protocol without IV contrast. RADIATION DOSE REDUCTION: This exam was performed according to the departmental dose-optimization program which includes automated exposure control, adjustment of the mA and/or kV according to patient size and/or use of iterative reconstruction technique. COMPARISON:  PET-CT 04/19/2022. CT of the chest, abdomen and pelvis 03/23/2022. FINDINGS: Lower chest: Atherosclerosis of the aorta and coronary arteries. A small pericardial effusion is unchanged. Precordial lymph node measuring 1 cm short axis on image 6/2 appears  similar to recent CT. There is subpleural reticulation at both lung bases with scattered small pulmonary nodules, felt to be suspicious for metastatic disease on recent CT. Hepatobiliary: Widespread hepatic metastatic disease has progressed from previous CTs. Please refer to recent PET-CT. There are small calcified gallstones with mild chronic gallbladder wall thickening. No significant biliary dilatation. Pancreas: Unremarkable. No pancreatic ductal dilatation or surrounding inflammatory changes. Spleen: Normal in size without focal abnormality. Adrenals/Urinary Tract: The adrenal glands appear stable without suspicious findings. Diffuse renovascular calcifications are present bilaterally. There are nonobstructing calculi in the lower pole of the left kidney measuring up to 7 mm in diameter. No evidence of ureteral calculus or hydronephrosis. The bladder appears unremarkable for its degree of distention. Stomach/Bowel: No enteric contrast administered. There are postsurgical changes from previous gastric bypass procedure. No small bowel distension, wall thickening or surrounding inflammation. There is mild nonspecific wall thickening in the sigmoid colon associated with underlying diverticulosis. No definite acute surrounding inflammation. No evidence of bowel obstruction. Vascular/Lymphatic: Multiple enlarged lymph nodes are present within the porta hepatis and upper retroperitoneum consistent with metastatic disease. There is a portacaval node measuring 5.0 x 2.5 cm on image 31/2. Diffuse aortic and branch vessel atherosclerosis without evidence of aneurysm. Reproductive: Calcified uterine fibroid on the right. No suspicious adnexal findings. Other: Increased ascites compared with CT done earlier this month, primarily in the pelvis and around the liver. This appears similar in volume to more recent PET-CT. No focal extraluminal fluid collection or pneumoperitoneum. Musculoskeletal: No acute osseous findings. No  focal lytic or blastic lesions are identified. There are degenerative changes in the spine, greatest at L4-5. IMPRESSION: 1. No definite acute findings or explanation for GI bleeding on noncontrast CT. 2. Widespread hepatic metastatic disease has progressed from previous CTS. Upper abdominal lymphadenopathy consistent with metastatic disease. See recent PET-CT. 3. Increased ascites compared with CT done earlier this month, primarily in the pelvis and around the liver. This appears similar in volume to more recent PET-CT. 4. Sigmoid diverticulosis with  mild nonspecific wall thickening. No evidence of bowel obstruction or perforation. 5. Nonobstructing left renal calculi. 6. Cholelithiasis with chronic gallbladder wall thickening. 7.  Aortic Atherosclerosis (ICD10-I70.0). Electronically Signed   By: Richardean Sale M.D.   On: 04/21/2022 11:50    Procedures .Critical Care  Performed by: Regan Lemming, MD Authorized by: Regan Lemming, MD   Critical care provider statement:    Critical care time (minutes):  30   Critical care was time spent personally by me on the following activities:  Development of treatment plan with patient or surrogate, discussions with consultants, evaluation of patient's response to treatment, examination of patient, ordering and review of laboratory studies, ordering and review of radiographic studies, ordering and performing treatments and interventions, pulse oximetry, re-evaluation of patient's condition and review of old charts   Care discussed with: admitting provider       Medications Ordered in ED Medications  sodium chloride 0.9 % bolus 1,000 mL (0 mLs Intravenous Stopped 04/21/22 1155)    And  0.9 %  sodium chloride infusion (has no administration in time range)  pantoprozole (PROTONIX) 80 mg /NS 100 mL infusion (has no administration in time range)  0.9 %  sodium chloride infusion (Manually program via Guardrails IV Fluids) (has no administration in time range)   octreotide (SANDOSTATIN) 2 mcg/mL load via infusion 100 mcg (has no administration in time range)    And  octreotide (SANDOSTATIN) 500 mcg in sodium chloride 0.9 % 250 mL (2 mcg/mL) infusion (has no administration in time range)  0.9 %  sodium chloride infusion (Manually program via Guardrails IV Fluids) (has no administration in time range)  pantoprazole (PROTONIX) 80 mg /NS 100 mL IVPB (0 mg Intravenous Stopped 04/21/22 1054)    ED Course/ Medical Decision Making/ A&P Clinical Course as of 04/21/22 1315  Sun Apr 21, 2022  1031 Hemoglobin(!!): 6.4 [JL]    Clinical Course User Index [JL] Regan Lemming, MD                             Medical Decision Making Amount and/or Complexity of Data Reviewed Labs: ordered. Decision-making details documented in ED Course. Radiology: ordered.  Risk Prescription drug management. Decision regarding hospitalization.    72 year old female with medical history significant for DM 2, depression, peripheral neuropathy, HTN, CKD, metastatic breast cancer with mets to the liver who presents to the emergency department with hematemesis and hematochezia.  The patient noticed a dark stool mixed in with her bowel movement last night.  This morning she had an episode of hematemesis.  She also had about 4 bloody bowel movements this morning.  She feels weak and fatigued and lightheaded.  Initial blood pressure was 70 systolic with EMS and the patient was administered 350 cc of normal saline and route with improvement to 90 systolic.  She states that she has been receiving chemotherapy for her metastatic breast cancer.  She endorses feeling significantly fatigued and lightheaded.  She is not on anticoagulation.  Endorses discomfort in the right upper quadrant of her abdomen.  On arrival, the patient was afebrile, borderline elevated heart rate pulse 91, hypertensive BP 92/53 and 89/50.  The patient was administered an IV fluid bolus on arrival.  Concern for acute  GI bleed with hematemesis and hematochezia.  No history of esophageal varices however the patient has mets to the liver.  The patient was started on IV Protonix and IV octreotide.  She had improvement  in her blood pressures following initial volume resuscitation with normal saline.  Initial hemoglobin revealed a significant hemoglobin drop to 6.4 from 10.8 thirteen days ago.  Given the patient's right upper quadrant tenderness on exam, CT abdomen pelvis without contrast was performed due to the patient's poor renal function and resulted as follows: IMPRESSION:  1. No definite acute findings or explanation for GI bleeding on  noncontrast CT.  2. Widespread hepatic metastatic disease has progressed from  previous CTS. Upper abdominal lymphadenopathy consistent with  metastatic disease. See recent PET-CT.  3. Increased ascites compared with CT done earlier this month,  primarily in the pelvis and around the liver. This appears similar  in volume to more recent PET-CT.  4. Sigmoid diverticulosis with mild nonspecific wall thickening. No  evidence of bowel obstruction or perforation.  5. Nonobstructing left renal calculi.  6. Cholelithiasis with chronic gallbladder wall thickening.  7.  Aortic Atherosclerosis (ICD10-I70.0).    Remainder of labs significant for an AKI on CKD with a creatinine of 2.2, elevated BUN to 62 which could be resulting in part due to the patient's acute GI bleed, LFTs were mildly elevated at 59 AST, normal ALT 22, alkaline phosphatase 214, T. bili was 0.7.  She was administered 2 units of PRBCs, spoke with Nicoletta Ba of [facility] gastroenterology who will see the patient in consultation.  Hospitalist medicine was consulted for admission and the patient was subsequently admitted in critical but stabilized condition.   Final Clinical Impression(s) / ED Diagnoses Final diagnoses:  Acute GI bleeding    Rx / DC Orders ED Discharge Orders     None         Regan Lemming, MD 04/21/22 1315

## 2022-04-21 NOTE — H&P (Signed)
History and Physical  Darlene [name] [id number] DOB: [birth year] DOA: 04/21/2022  PCP: Isaac Bliss, Rayford Halsted, MD   Chief Complaint: GI bleeding   HPI: Darlene [name] is a 72 y.o. female with medical history significant for metastatic breast cancer currently undergoing therapy, CKD, CHF, depression, insulin-dependent type 2 diabetes history of Roux-en-Y bypass admitted to the hospital for GI bleeding.  Patient says she was in her usual state of health until she vomited up a blood clot this morning, and then had several dark bowel movements.  Denies any fevers, chills or vomiting afterwards no chest pain.  ED Course: She came to the emergency department for evaluation, She was found to be afebrile, with normal pulse, blood pressure as low as 89/50.  She was given IV fluids, and labs were obtained demonstrated WBC 34,000, hemoglobin 6.4, creatinine 2.20 up from her baseline of about 1.70.  She was started on PPI and octreotide infusions.  Hospitalist contacted for admission  This Afternoon: She was seen in consultation by gastroenterology service Dr. Hilarie Fredrickson, who took the patient for upper endoscopy.  Endoscopy revealed at least 2 ulcers in the proximity of the anastomotic line of her Roux-en-Y bypass.  Clotting gel was placed, and all bleeding ceased.  Currently she is resting comfortably and asymptomatic, she has received 2 unit blood transfusion and posttransfusion hemoglobin is pending.   Review of Systems: Please see HPI for pertinent positives and negatives. A complete 10 system review of systems are otherwise negative.  Past Medical History:  Diagnosis Date   Anemia    hx of - during 1st round chemo   Anxiety    Arthritis    in fingers, shoulders   Back pain    Balance problem    Breast cancer ([place])    Breast cancer of upper-outer quadrant of right female breast ([place]) 07/22/2014   Bunion, right foot    CHF (congestive heart failure) (HCC)    Chronic kidney  disease    creatininei levels high per pt    Clumsiness    Constipation    Depression    Diabetes mellitus without complication ([place])    123456 years type 2    Diabetic retinopathy ([place])    torn retina   Eating disorder    binge eating   Epistaxis 08/26/2014   Fatty liver    Foot drop, left foot    HCAP (healthcare-associated pneumonia) 12/18/2014   Heart murmur    never had any problems   History of radiation therapy 04/05/15-05/19/15   right chest wall was treated to 50.4 Gy in 28 fractions, right mastectomy scar was treated to 10 Gy in 5 fractions   Hyperlipidemia    Hypertension    Joint pain    Multiple food allergies    Neuromuscular disorder ([place])    diabetic neuropathy   Obesity    Obstructive sleep apnea on CPAP    does not use cpap all the time has lost 160 lbs    Ovarian cyst rupture    possible   Pneumonia    Pneumonia 11/2014   Radiation 04/05/15-05/19/15   right chest wall 50.4 Gy, mastectomy scar 10 Gy   Sleep apnea    Thyroid nodule 06/2014   Past Surgical History:  Procedure Laterality Date   ANAL RECTAL MANOMETRY N/A 08/01/2021   Procedure: ANO RECTAL MANOMETRY;  Surgeon: Thornton Park, MD;  Location: [facility];  Service: Gastroenterology;  Laterality: N/A;   APPENDECTOMY     BIOPSY  THYROID Bilateral 06/2014   2 nodules - Benign    BREAST LUMPECTOMY WITH RADIOACTIVE SEED AND SENTINEL LYMPH NODE BIOPSY Right 12/27/2014   Procedure: BREAST LUMPECTOMY WITH RADIOACTIVE SEED AND SENTINEL LYMPH NODE BIOPSY;  Surgeon: Stark Klein, MD;  Location: [facility];  Service: General;  Laterality: Right;   BREAST REDUCTION SURGERY Bilateral 01/06/2015   Procedure: BILATERAL BREAST REDUCTION, RIGHT ONCOPLASTIC RECONSTRUCTION),LEFT BREAST REDUCTION FOR SYMMETRY;  Surgeon: [name], MD;  Location: [facility];  Service: Plastics;  Laterality: Bilateral;   BREATH TEK H PYLORI N/A 09/15/2012   Procedure: BREATH TEK H PYLORI;  Surgeon: Pedro Earls, MD;   Location: [facility];  Service: General;  Laterality: N/A;   BUNIONECTOMY Left 12/2013   CARPAL TUNNEL RELEASE Right    CARPAL TUNNEL RELEASE Left    CATARACT EXTRACTION     6/19   COLONOSCOPY     DUPUYTREN CONTRACTURE RELEASE     GASTRIC ROUX-EN-Y N/A 11/02/2012   Procedure: LAPAROSCOPIC ROUX-EN-Y GASTRIC;  Surgeon: Pedro Earls, MD;  [facility];  Service: General;  Laterality: N/A;   IR GENERIC HISTORICAL  03/18/2016   IR US GUIDE VASC ACCESS LEFT 03/18/2016 Markus Daft, MD [facility]   IR GENERIC HISTORICAL  03/18/2016   IR FLUORO GUIDE CV LINE LEFT 03/18/2016 Markus Daft, MD [facility]   LAPAROSCOPIC APPENDECTOMY N/A 07/22/2015   Procedure: APPENDECTOMY LAPAROSCOPIC;  Surgeon: Michael Boston, MD;  [facility];  Service: General;  Laterality: N/A;   MASTECTOMY Right 02/15/2015   modified   MODIFIED MASTECTOMY Right 02/15/2015   Procedure: RIGHT MODIFIED RADICAL MASTECTOMY;  Surgeon: Stark Klein, MD;  Location: [facility];  Service: General;  Laterality: Right;   ORIF WRIST FRACTURE Left 05/11/2019   Procedure: OPEN REDUCTION INTERNAL FIXATION (ORIF) left distal radius fracture;  Surgeon: Renette Butters, MD;  Location: [facility];  Service: Orthopedics;  Laterality: Left;  with block   PORT-A-CATH REMOVAL Left 10/10/2015   Procedure: PORT REMOVAL;  Surgeon: Stark Klein, MD;  Location: [facility];  Service: General;  Laterality: Left;   PORTACATH PLACEMENT Left 08/02/2014   Procedure: INSERTION PORT-A-CATH;  Surgeon: Stark Klein, MD;  Location: [place];  Service: General;  Laterality: Left;   PORTACATH PLACEMENT N/A 11/05/2016   Procedure: INSERTION PORT-A-CATH;  Surgeon: Stark Klein, MD;  Location: [place];  Service: General;  Laterality: N/A;   RETINAL LASER PROCEDURE     retina tear on left eye   ROTATOR CUFF REPAIR Right 2005   SCAR REVISION Right 12/08/2015   Procedure: COMPLEX REPAIR RIGHT CHEST 10-15CM;  Surgeon: [name], MD;  Location: [facility];  Service: Plastics;  Laterality: Right;   TOE SURGERY  1970s   bone spur   TRIGGER FINGER RELEASE     x3    Social History:  reports that she has never smoked. She has never used smokeless tobacco. She reports current alcohol use of about 1.0 - 2.0 standard drink of alcohol per week. She reports that she does not use drugs.   Allergies  Allergen Reactions   Antihistamines, Diphenhydramine-Type Other (See Comments)    IV diphenhydramine causes sedation.   Nsaids Other (See Comments)    H/o gastric bypass - avoid NSAIDs   Peanut Allergen Powder-Dnfp Itching   Tape Hives, Rash and Other (See Comments)    Adhesives in bandaids    Family History  Problem Relation Age of Onset   CVA Mother  Heart disease Mother    Sudden death Mother    Diabetes Father    Heart failure Father    Hypertension Father    Kidney disease Father    Obesity Father    Hypertension Sister    Diabetes Brother    Breast cancer Paternal Grandmother 67   Diabetes Paternal Uncle    Ovarian cancer Neg Hx      Prior to Admission medications   Medication Sig Start Date End Date Taking? Authorizing Provider  aspirin EC 81 MG tablet Take 1 tablet (81 mg total) by mouth daily. 03/18/19  Yes Larey Dresser, MD  Bempedoic Acid-Ezetimibe (NEXLIZET) 180-10 MG TABS Take 1 tablet by mouth daily. 08/08/21  Yes Larey Dresser, MD  buPROPion [facility] SR) 200 MG 12 hr tablet Take one tablet by mouth once daily. Patient taking differently: Take 200 mg by mouth daily. Take one tablet by mouth once daily. 02/26/22  Yes Beasley, Caren D, MD  CALCIUM CITRATE-VITAMIN D PO Take 1 tablet by mouth 3 (three) times daily.    Yes [provider]  carvedilol (COREG) 6.25 MG tablet Take 1 tablet (6.25 mg total) by mouth 2 (two) times daily. 11/02/21  Yes Larey Dresser, MD  cholecalciferol (VITAMIN D) 1000 units tablet Take 1,000 Units by mouth daily.   Yes [provider]  Cyanocobalamin  (VITAMIN B-12 PO) Take 1 tablet by mouth every 14 (fourteen) days.   Yes [provider]  insulin aspart (NOVOLOG) 100 UNIT/ML injection Inject into the skin daily as needed for high blood sugar. Per patient, sliding scale with meals as needed   Yes [provider]  insulin glargine (LANTUS SOLOSTAR) 100 UNIT/ML Solostar Pen Inject 8 Units into the skin daily. Patient taking differently: Inject 11 Units into the skin daily. 10/05/20  Yes Beasley, Caren D, MD  KERENDIA 20 MG TABS TAKE 1 TABLET EVERY DAY Patient taking differently: Take 20 mg by mouth daily. 01/22/22  Yes Larey Dresser, MD  Lactobacillus ([employer] ACIDOPHILUS PROBIOTIC) 0.5 MG TABS Take 1 tablet by mouth daily. 10/19/21  Yes Nicholas Lose, MD  loperamide (IMODIUM) 2 MG capsule Take 2 mg by mouth as needed for diarrhea or loose stools.   Yes [provider]  loratadine (CLARITIN) 10 MG tablet Take 10 mg by mouth daily.   Yes [provider]  melatonin 5 MG TABS Take 5 mg by mouth at bedtime.   Yes [provider]  Multiple Vitamins-Minerals (OPURITY) TABS Take 1 tablet by mouth 2 (two) times daily.   Yes [provider]  ondansetron (ZOFRAN) 8 MG tablet Take 1 tablet (8 mg total) by mouth every 8 (eight) hours as needed for nausea or vomiting. 03/21/22  Yes Nicholas Lose, MD  polyethylene glycol (MIRALAX / GLYCOLAX) 17 g packet Take 17 g by mouth daily as needed for moderate constipation.   Yes [provider]  prochlorperazine (COMPAZINE) 10 MG tablet Take 1 tablet (10 mg total) by mouth every 6 (six) hours as needed (Nausea or vomiting). 03/21/22  Yes Nicholas Lose, MD  Propylene Glycol (SYSTANE BALANCE OP) Place 1-2 drops into both eyes 3 (three) times daily as needed (for dry eyes).    Yes [provider]  tobramycin (TOBREX) 0.3 % ophthalmic ointment Place 1 application into both eyes as needed (Takes day before eye injections).   Yes [provider]   vortioxetine HBr (TRINTELLIX) 20 MG TABS tablet Take 1 tablet (20 mg total) by mouth  at bedtime. 02/26/22  Yes Beasley, Caren D, MD  Accu-Chek Softclix Lancets lancets 1 each by Other route 4 (four) times daily. Test blood sugars 4 x times daily 05/08/21   Dennard Nip D, MD  Blood Glucose Calibration (ACCU-CHEK GUIDE CONTROL) LIQD 1 Bottle by In Vitro route as needed. 04/15/19   Dennard Nip D, MD  Blood Glucose Monitoring Suppl (ACCU-CHEK GUIDE) w/Device KIT 1 each by Does not apply route 4 (four) times daily. Use to test blood sugars 4x a day 03/08/19   Dennard Nip D, MD  glucose blood (ACCU-CHEK GUIDE) test strip Use Accu Chek test strips to check blood sugar 4 times daily. DX:E11.21 02/06/22   Nicholas Lose, MD  SURE COMFORT PEN NEEDLES 32G X 4 MM MISC Use as instructed to inject insulin and Victoza daily. 09/21/20   Starlyn Skeans, MD    Physical Exam: BP (!) 108/56   Pulse 95   Temp 98.1 F (36.7 C)   Resp (!) 21   LMP 10/22/2007   SpO2 99%   General:  Alert, oriented, calm, in no acute distress, family at bedside Eyes: EOMI, clear conjuctivae, white sclerea Neck: supple, no masses, trachea mildline  Cardiovascular: RRR, no murmurs or rubs, no peripheral edema  Respiratory: clear to auscultation bilaterally, no wheezes, no crackles  Abdomen: soft, nontender, nondistended, normal bowel tones heard  Skin: dry, no rashes  Musculoskeletal: no joint effusions, normal range of motion  Psychiatric: appropriate affect, normal speech  Neurologic: extraocular muscles intact, clear speech, moving all extremities with intact sensorium          Labs on Admission:  Basic Metabolic Panel: Recent Labs  Lab 04/21/22 0919  NA 135  K 4.7  CL 105  CO2 19*  GLUCOSE 206*  BUN 62*  CREATININE 2.20*  CALCIUM 8.7*   Liver Function Tests: Recent Labs  Lab 04/21/22 0919  AST 59*  ALT 22  ALKPHOS 214*  BILITOT 0.7  PROT 5.0*  ALBUMIN 2.7*   No results for input(s): "LIPASE",  "AMYLASE" in the last 168 hours. No results for input(s): "AMMONIA" in the last 168 hours. CBC: Recent Labs  Lab 04/21/22 0919  WBC 34.2*  HGB 6.4*  HCT 20.9*  MCV 101.0*  PLT 281   Cardiac Enzymes: No results for input(s): "CKTOTAL", "CKMB", "CKMBINDEX", "TROPONINI" in the last 168 hours.  BNP (last 3 results) No results for input(s): "BNP" in the last 8760 hours.  ProBNP (last 3 results) No results for input(s): "PROBNP" in the last 8760 hours.  CBG: Recent Labs  Lab 04/19/22 1029  GLUCAP 99    Radiological Exams on Admission: CT ABDOMEN PELVIS WO CONTRAST  Result Date: 04/21/2022 CLINICAL DATA:  GI bleeding. History of metastatic breast cancer. * Tracking Code: BO * EXAM: CT ABDOMEN AND PELVIS WITHOUT CONTRAST TECHNIQUE: Multidetector CT imaging of the abdomen and pelvis was performed following the standard protocol without IV contrast. RADIATION DOSE REDUCTION: This exam was performed according to the departmental dose-optimization program which includes automated exposure control, adjustment of the mA and/or kV according to patient size and/or use of iterative reconstruction technique. COMPARISON:  PET-CT 04/19/2022. CT of the chest, abdomen and pelvis 03/23/2022. FINDINGS: Lower chest: Atherosclerosis of the aorta and coronary arteries. A small pericardial effusion is unchanged. Precordial lymph node measuring 1 cm short axis on image 6/2 appears similar to recent CT. There is subpleural reticulation at both lung bases with scattered small pulmonary nodules, felt to be suspicious for metastatic disease  on recent CT. Hepatobiliary: Widespread hepatic metastatic disease has progressed from previous CTs. Please refer to recent PET-CT. There are small calcified gallstones with mild chronic gallbladder wall thickening. No significant biliary dilatation. Pancreas: Unremarkable. No pancreatic ductal dilatation or surrounding inflammatory changes. Spleen: Normal in size without focal  abnormality. Adrenals/Urinary Tract: The adrenal glands appear stable without suspicious findings. Diffuse renovascular calcifications are present bilaterally. There are nonobstructing calculi in the lower pole of the left kidney measuring up to 7 mm in diameter. No evidence of ureteral calculus or hydronephrosis. The bladder appears unremarkable for its degree of distention. Stomach/Bowel: No enteric contrast administered. There are postsurgical changes from previous gastric bypass procedure. No small bowel distension, wall thickening or surrounding inflammation. There is mild nonspecific wall thickening in the sigmoid colon associated with underlying diverticulosis. No definite acute surrounding inflammation. No evidence of bowel obstruction. Vascular/Lymphatic: Multiple enlarged lymph nodes are present within the porta hepatis and upper retroperitoneum consistent with metastatic disease. There is a portacaval node measuring 5.0 x 2.5 cm on image 31/2. Diffuse aortic and branch vessel atherosclerosis without evidence of aneurysm. Reproductive: Calcified uterine fibroid on the right. No suspicious adnexal findings. Other: Increased ascites compared with CT done earlier this month, primarily in the pelvis and around the liver. This appears similar in volume to more recent PET-CT. No focal extraluminal fluid collection or pneumoperitoneum. Musculoskeletal: No acute osseous findings. No focal lytic or blastic lesions are identified. There are degenerative changes in the spine, greatest at L4-5. IMPRESSION: 1. No definite acute findings or explanation for GI bleeding on noncontrast CT. 2. Widespread hepatic metastatic disease has progressed from previous CTS. Upper abdominal lymphadenopathy consistent with metastatic disease. See recent PET-CT. 3. Increased ascites compared with CT done earlier this month, primarily in the pelvis and around the liver. This appears similar in volume to more recent PET-CT. 4. Sigmoid  diverticulosis with mild nonspecific wall thickening. No evidence of bowel obstruction or perforation. 5. Nonobstructing left renal calculi. 6. Cholelithiasis with chronic gallbladder wall thickening. 7.  Aortic Atherosclerosis (ICD10-I70.0). Electronically Signed   By: Richardean Sale M.D.   On: 04/21/2022 11:50    Assessment/Plan Principal Problem:   GI bleed -upper GI bleed due to ulcer at anastomosis of Roux-en-Y, she was recently placed on steroids after her cancer treatment, this may likely be the cause -Inpatient admission to progressive unit -Continue PPI infusion, discontinue octreotide -Check posttransfusion hemoglobin, goal hemoglobin greater than 7 -Okay for clear liquid diet -GI recommendations and management much appreciated, they will follow  Active Problems:   Bariatric surgery status   Chemotherapy-induced peripheral neuropathy (HCC)   Recurrent breast cancer, right (HCC)   Diabetic retinopathy of both eyes associated with type 2 diabetes mellitus ([place])   Essential hypertension-holding home antihypertensives   Diabetes mellitus (HCC)-diabetic diet when eating as diet is advanced, for now half her home dose basal Lantus, and sliding scale insulin in the meantime   Metastatic cancer to liver ([place])   Acute blood loss anemia ([facility]) -due to upper GI bleed, GI intervened today 3/31, she was also transfused 2 units of blood, will check posttransfusion hemoglobin, goal greater than 7   AKI (acute kidney injury) (HCC)-slight elevation from baseline, likely due to dehydration and poor renal flow due to bleeding and anemia.  Avoid nephrotoxins, recheck renal function in the morning   Leukocytosis-no signs or symptoms of infection, leukocytosis may well be from vomiting and active GI bleed, will trend with morning labs   DVT prophylaxis:  SCDs    Code Status: Full Code  Consults called: None  Admission status: The appropriate patient status for this patient is INPATIENT. Inpatient  status is judged to be reasonable and necessary in order to provide the required intensity of service to ensure the patient's safety. The patient's presenting symptoms, physical exam findings, and initial radiographic and laboratory data in the context of their chronic comorbidities is felt to place them at high risk for further clinical deterioration. Furthermore, it is not anticipated that the patient will be medically stable for discharge from the hospital within 2 midnights of admission.    I certify that at the point of admission it is my clinical judgment that the patient will require inpatient hospital care spanning beyond 2 midnights from the point of admission due to high intensity of service, high risk for further deterioration and high frequency of surveillance required   Time spent: 48 minutes  Addiel Mccardle Neva Seat MD [facility] Pager [phone]  If 7PM-7AM, please contact night-coverage [email or web address] Password [facility]  04/21/2022, 2:48 PM

## 2022-04-21 NOTE — Procedures (Addendum)
I spoke to the patient's husband Darlene [name] by phone after the procedure. I made him aware of our findings and treatment as well as my recommendation for treatment plan. Time provided for questions and answers and he thanked me for the call

## 2022-04-21 NOTE — Consult Note (Signed)
Consultation  Referring Provider: ER MD/saline/[place] Primary Care Physician:  Isaac Bliss, Rayford Halsted, MD Primary Gastroenterologist:  Dr. Tarri Glenn  Reason for Consultation: Acute upper GI bleed   HPI: Darlene [name] is a 72 y.o. female, with metastatic breast cancer, known widespread hepatic metastases, new pulmonary metastases who is undergoing chemotherapy.  She started a new regimen and has had 2 rounds of that over the past month.  She has also been receiving steroids. Patient is status post Roux-en-Y gastric bypass about 9 years ago for obesity, and says unfortunately 1 year later she was diagnosed with breast cancer. She had a prior EGD done in 2019 per Dr. Collene Mares that showed hemorrhagic gastritis and evidence of a B2. Colonoscopy 2015/Dr. Collene Mares showing diffuse diverticulosis.  Patient has not had any prior GI bleeding, she is not currently anticoagulated. She had onset last evening of nausea and then vomited up what she says looked like a large clot of blood.  She got up to head to the bathroom and had "an explosion" of stool which was all grossly blotting maroonish and clots. Has not had any further vomiting and has not had any further stools since arrival in the emergency room.  She says she has not been feeling well over the past couple of weeks have been extremely fatigued but has not noted any melena or hematochezia prior to the episode early in the morning hours today. CT of the abdomen and pelvis today without contrast shows widespread hepatic metastases progressed from last exam, gallstones evidence of prior gastric bypass, nonspecific wall thickening in the sigmoid colon associated with underlying diverticulosis, she has multiple enlarged lymph nodes within the porta hepatis and upper retroperitoneum consistent with metastatic disease, there is a large portacaval node, and increased ascites compared to CT earlier this month.  Labs show WBC of 34.2/hemoglobin  6.4/platelets 281 Hemoglobin was 1.8 on 04/08/2022 INR is pending sodium 137/potassium 4.7/BUN 62/creatinine 2.2 T. bili 0.7/alk phos 204/ALT 22/AST 59    Past Medical History:  Diagnosis Date   Anemia    hx of - during 1st round chemo   Anxiety    Arthritis    in fingers, shoulders   Back pain    Balance problem    Breast cancer (HCC)    Breast cancer of upper-outer quadrant of right female breast ([place]) 07/22/2014   Bunion, right foot    CHF (congestive heart failure) (HCC)    Chronic kidney disease    creatininei levels high per pt    Clumsiness    Constipation    Depression    Diabetes mellitus without complication ([place])    123456 years type 2    Diabetic retinopathy ([place])    torn retina   Eating disorder    binge eating   Epistaxis 08/26/2014   Fatty liver    Foot drop, left foot    HCAP (healthcare-associated pneumonia) 12/18/2014   Heart murmur    never had any problems   History of radiation therapy 04/05/15-05/19/15   right chest wall was treated to 50.4 Gy in 28 fractions, right mastectomy scar was treated to 10 Gy in 5 fractions   Hyperlipidemia    Hypertension    Joint pain    Multiple food allergies    Neuromuscular disorder ([place])    diabetic neuropathy   Obesity    Obstructive sleep apnea on CPAP    does not use cpap all the time has lost 160 lbs    Ovarian  cyst rupture    possible   Pneumonia    Pneumonia 11/2014   Radiation 04/05/15-05/19/15   right chest wall 50.4 Gy, mastectomy scar 10 Gy   Sleep apnea    Thyroid nodule 06/2014    Past Surgical History:  Procedure Laterality Date   ANAL RECTAL MANOMETRY N/A 08/01/2021   Procedure: ANO RECTAL MANOMETRY;  Surgeon: Thornton Park, MD;  Location: [facility];  Service: Gastroenterology;  Laterality: N/A;   APPENDECTOMY     BIOPSY THYROID Bilateral 06/2014   2 nodules - Benign    BREAST LUMPECTOMY WITH RADIOACTIVE SEED AND SENTINEL LYMPH NODE BIOPSY Right 12/27/2014   Procedure: BREAST LUMPECTOMY WITH  RADIOACTIVE SEED AND SENTINEL LYMPH NODE BIOPSY;  Surgeon: Stark Klein, MD;  Location: [facility];  Service: General;  Laterality: Right;   BREAST REDUCTION SURGERY Bilateral 01/06/2015   Procedure: BILATERAL BREAST REDUCTION, RIGHT ONCOPLASTIC RECONSTRUCTION),LEFT BREAST REDUCTION FOR SYMMETRY;  Surgeon: [name], MD;  Location: [facility];  Service: Plastics;  Laterality: Bilateral;   BREATH TEK H PYLORI N/A 09/15/2012   Procedure: BREATH TEK H PYLORI;  Surgeon: Pedro Earls, MD;  Location: [facility];  Service: General;  Laterality: N/A;   BUNIONECTOMY Left 12/2013   CARPAL TUNNEL RELEASE Right    CARPAL TUNNEL RELEASE Left    CATARACT EXTRACTION     6/19   COLONOSCOPY     DUPUYTREN CONTRACTURE RELEASE     GASTRIC ROUX-EN-Y N/A 11/02/2012   Procedure: LAPAROSCOPIC ROUX-EN-Y GASTRIC;  Surgeon: Pedro Earls, MD;  [facility];  Service: General;  Laterality: N/A;   IR GENERIC HISTORICAL  03/18/2016   IR US GUIDE VASC ACCESS LEFT 03/18/2016 Markus Daft, MD [facility]   IR GENERIC HISTORICAL  03/18/2016   IR FLUORO GUIDE CV LINE LEFT 03/18/2016 Markus Daft, MD [facility]   LAPAROSCOPIC APPENDECTOMY N/A 07/22/2015   Procedure: APPENDECTOMY LAPAROSCOPIC;  Surgeon: Michael Boston, MD;  [facility];  Service: General;  Laterality: N/A;   MASTECTOMY Right 02/15/2015   modified   MODIFIED MASTECTOMY Right 02/15/2015   Procedure: RIGHT MODIFIED RADICAL MASTECTOMY;  Surgeon: Stark Klein, MD;  Location: [facility];  Service: General;  Laterality: Right;   ORIF WRIST FRACTURE Left 05/11/2019   Procedure: OPEN REDUCTION INTERNAL FIXATION (ORIF) left distal radius fracture;  Surgeon: Renette Butters, MD;  Location: [facility];  Service: Orthopedics;  Laterality: Left;  with block   PORT-A-CATH REMOVAL Left 10/10/2015   Procedure: PORT REMOVAL;  Surgeon: Stark Klein, MD;  Location: [facility];  Service: General;  Laterality: Left;   PORTACATH PLACEMENT Left 08/02/2014   Procedure:  INSERTION PORT-A-CATH;  Surgeon: Stark Klein, MD;  Location: [facility];  Service: General;  Laterality: Left;   PORTACATH PLACEMENT N/A 11/05/2016   Procedure: INSERTION PORT-A-CATH;  Surgeon: Stark Klein, MD;  Location: [facility];  Service: General;  Laterality: N/A;   RETINAL LASER PROCEDURE     retina tear on left eye   ROTATOR CUFF REPAIR Right 2005   SCAR REVISION Right 12/08/2015   Procedure: COMPLEX REPAIR RIGHT CHEST 10-15CM;  Surgeon: [name], MD;  Location: [facility];  Service: Plastics;  Laterality: Right;   TOE SURGERY  1970s   bone spur   TRIGGER FINGER RELEASE     x3    Prior to Admission medications   Medication Sig Start Date End Date Taking? Authorizing Provider  aspirin EC 81 MG tablet Take 1 tablet (81 mg total) by  mouth daily. 03/18/19  Yes Larey Dresser, MD  Bempedoic Acid-Ezetimibe (NEXLIZET) 180-10 MG TABS Take 1 tablet by mouth daily. 08/08/21  Yes Larey Dresser, MD  buPROPion [facility] SR) 200 MG 12 hr tablet Take one tablet by mouth once daily. Patient taking differently: Take 200 mg by mouth daily. Take one tablet by mouth once daily. 02/26/22  Yes Beasley, Caren D, MD  CALCIUM CITRATE-VITAMIN D PO Take 1 tablet by mouth 3 (three) times daily.    Yes [provider]  carvedilol (COREG) 6.25 MG tablet Take 1 tablet (6.25 mg total) by mouth 2 (two) times daily. 11/02/21  Yes Larey Dresser, MD  cholecalciferol (VITAMIN D) 1000 units tablet Take 1,000 Units by mouth daily.   Yes [provider]  Cyanocobalamin (VITAMIN B-12 PO) Take 1 tablet by mouth every 14 (fourteen) days.   Yes [provider]  insulin aspart (NOVOLOG) 100 UNIT/ML injection Inject into the skin daily as needed for high blood sugar. Per patient, sliding scale with meals as needed   Yes [provider]  insulin glargine (LANTUS SOLOSTAR) 100 UNIT/ML Solostar Pen Inject 8 Units into the skin daily. Patient taking  differently: Inject 11 Units into the skin daily. 10/05/20  Yes Beasley, Caren D, MD  KERENDIA 20 MG TABS TAKE 1 TABLET EVERY DAY Patient taking differently: Take 20 mg by mouth daily. 01/22/22  Yes Larey Dresser, MD  Lactobacillus ([employer]S ACIDOPHILUS PROBIOTIC) 0.5 MG TABS Take 1 tablet by mouth daily. 10/19/21  Yes Nicholas Lose, MD  loperamide (IMODIUM) 2 MG capsule Take 2 mg by mouth as needed for diarrhea or loose stools.   Yes [provider]  loratadine (CLARITIN) 10 MG tablet Take 10 mg by mouth daily.   Yes [provider]  melatonin 5 MG TABS Take 5 mg by mouth at bedtime.   Yes [provider]  Multiple Vitamins-Minerals (OPURITY) TABS Take 1 tablet by mouth 2 (two) times daily.   Yes [provider]  ondansetron (ZOFRAN) 8 MG tablet Take 1 tablet (8 mg total) by mouth every 8 (eight) hours as needed for nausea or vomiting. 03/21/22  Yes Nicholas Lose, MD  polyethylene glycol (MIRALAX / GLYCOLAX) 17 g packet Take 17 g by mouth daily as needed for moderate constipation.   Yes [provider]  prochlorperazine (COMPAZINE) 10 MG tablet Take 1 tablet (10 mg total) by mouth every 6 (six) hours as needed (Nausea or vomiting). 03/21/22  Yes Nicholas Lose, MD  Propylene Glycol (SYSTANE BALANCE OP) Place 1-2 drops into both eyes 3 (three) times daily as needed (for dry eyes).    Yes [provider]  tobramycin (TOBREX) 0.3 % ophthalmic ointment Place 1 application into both eyes as needed (Takes day before eye injections).   Yes [provider]  vortioxetine HBr (TRINTELLIX) 20 MG TABS tablet Take 1 tablet (20 mg total) by mouth at bedtime. 02/26/22  Yes Beasley, Caren D, MD  Accu-Chek Softclix Lancets lancets 1 each by Other route 4 (four) times daily. Test blood sugars 4 x times daily 05/08/21   Dennard Nip D, MD  Blood Glucose Calibration (ACCU-CHEK GUIDE CONTROL) LIQD 1 Bottle by In Vitro route as needed. 04/15/19   Dennard Nip D, MD   Blood Glucose Monitoring Suppl (ACCU-CHEK GUIDE) w/Device KIT 1 each by Does not apply route 4 (four) times daily. Use to test blood sugars 4x a day 03/08/19   Dennard Nip D, MD  glucose blood (ACCU-CHEK  GUIDE) test strip Use Accu Chek test strips to check blood sugar 4 times daily. DX:E11.21 02/06/22   Nicholas Lose, MD  SURE COMFORT PEN NEEDLES 32G X 4 MM MISC Use as instructed to inject insulin and Victoza daily. 09/21/20   Dennard Nip D, MD    Current Facility-Administered Medications  Medication Dose Route Frequency Provider Last Rate Last Admin   0.9 %  sodium chloride infusion (Manually program via Guardrails IV Fluids)   Intravenous Once Regan Lemming, MD       0.9 %  sodium chloride infusion (Manually program via Guardrails IV Fluids)   Intravenous Once Regan Lemming, MD       0.9 %  sodium chloride infusion   Intravenous Continuous Regan Lemming, MD       octreotide (SANDOSTATIN) 500 mcg in sodium chloride 0.9 % 250 mL (2 mcg/mL) infusion  50 mcg/hr Intravenous Continuous Regan Lemming, MD 25 mL/hr at 04/21/22 1422 50 mcg/hr at 04/21/22 1422   pantoprozole (PROTONIX) 80 mg /NS 100 mL infusion  8 mg/hr Intravenous Continuous Regan Lemming, MD 10 mL/hr at 04/21/22 1426 8 mg/hr at 04/21/22 1426   Current Outpatient Medications  Medication Sig Dispense Refill   aspirin EC 81 MG tablet Take 1 tablet (81 mg total) by mouth daily. 90 tablet 3   Bempedoic Acid-Ezetimibe (NEXLIZET) 180-10 MG TABS Take 1 tablet by mouth daily. 90 tablet 3   buPROPion (WELLBUTRIN SR) 200 MG 12 hr tablet Take one tablet by mouth once daily. (Patient taking differently: Take 200 mg by mouth daily. Take one tablet by mouth once daily.) 90 tablet 0   CALCIUM CITRATE-VITAMIN D PO Take 1 tablet by mouth 3 (three) times daily.      carvedilol (COREG) 6.25 MG tablet Take 1 tablet (6.25 mg total) by mouth 2 (two) times daily. 180 tablet 3   cholecalciferol (VITAMIN D) 1000 units tablet Take 1,000 Units by mouth  daily.     Cyanocobalamin (VITAMIN B-12 PO) Take 1 tablet by mouth every 14 (fourteen) days.     insulin aspart (NOVOLOG) 100 UNIT/ML injection Inject into the skin daily as needed for high blood sugar. Per patient, sliding scale with meals as needed     insulin glargine (LANTUS SOLOSTAR) 100 UNIT/ML Solostar Pen Inject 8 Units into the skin daily. (Patient taking differently: Inject 11 Units into the skin daily.) 7 mL 3   KERENDIA 20 MG TABS TAKE 1 TABLET EVERY DAY (Patient taking differently: Take 20 mg by mouth daily.) 90 tablet 3   Lactobacillus (CVS ACIDOPHILUS PROBIOTIC) 0.5 MG TABS Take 1 tablet by mouth daily.     loperamide (IMODIUM) 2 MG capsule Take 2 mg by mouth as needed for diarrhea or loose stools.     loratadine (CLARITIN) 10 MG tablet Take 10 mg by mouth daily.     melatonin 5 MG TABS Take 5 mg by mouth at bedtime.     Multiple Vitamins-Minerals (OPURITY) TABS Take 1 tablet by mouth 2 (two) times daily.     ondansetron (ZOFRAN) 8 MG tablet Take 1 tablet (8 mg total) by mouth every 8 (eight) hours as needed for nausea or vomiting. 30 tablet 2   polyethylene glycol (MIRALAX / GLYCOLAX) 17 g packet Take 17 g by mouth daily as needed for moderate constipation.     prochlorperazine (COMPAZINE) 10 MG tablet Take 1 tablet (10 mg total) by mouth every 6 (six) hours as needed (Nausea or vomiting). 30 tablet 6   Propylene Glycol (  SYSTANE BALANCE OP) Place 1-2 drops into both eyes 3 (three) times daily as needed (for dry eyes).      tobramycin (TOBREX) 0.3 % ophthalmic ointment Place 1 application into both eyes as needed (Takes day before eye injections).     vortioxetine HBr (TRINTELLIX) 20 MG TABS tablet Take 1 tablet (20 mg total) by mouth at bedtime. 90 tablet 0   Accu-Chek Softclix Lancets lancets 1 each by Other route 4 (four) times daily. Test blood sugars 4 x times daily 200 each 1   Blood Glucose Calibration (ACCU-CHEK GUIDE CONTROL) LIQD 1 Bottle by In Vitro route as needed. 1 each  0   Blood Glucose Monitoring Suppl (ACCU-CHEK GUIDE) w/Device KIT 1 each by Does not apply route 4 (four) times daily. Use to test blood sugars 4x a day 1 kit 0   glucose blood (ACCU-CHEK GUIDE) test strip Use Accu Chek test strips to check blood sugar 4 times daily. DX:E11.21 400 each 3   SURE COMFORT PEN NEEDLES 32G X 4 MM MISC Use as instructed to inject insulin and Victoza daily. 200 each 0   Facility-Administered Medications Ordered in Other Encounters  Medication Dose Route Frequency Provider Last Rate Last Admin   acetaminophen (TYLENOL) 325 MG tablet            diphenhydrAMINE (BENADRYL) 50 MG/ML injection            famotidine (PEPCID) 20-0.9 MG/50ML-% IVPB            palonosetron (ALOXI) 0.25 MG/5ML injection             Allergies as of 04/21/2022 - Review Complete 04/21/2022  Allergen Reaction Noted   Antihistamines, diphenhydramine-type Other (See Comments) 09/21/2021   Nsaids Other (See Comments) 07/22/2015   Peanut allergen powder-dnfp Itching 08/09/2014   Tape Hives, Rash, and Other (See Comments) 12/18/2014    Family History  Problem Relation Age of Onset   CVA Mother    Heart disease Mother    Sudden death Mother    Diabetes Father    Heart failure Father    Hypertension Father    Kidney disease Father    Obesity Father    Hypertension Sister    Diabetes Brother    Breast cancer Paternal Grandmother 42   Diabetes Paternal Uncle    Ovarian cancer Neg Hx     Social History   Socioeconomic History   Marital status: Married    Spouse name: [name]   Number of children: 0   Years of education: Bachelor's   Highest education level: Bachelor's degree (e.g., BA, AB, BS)  Occupational History   Occupation: Pensions consultant: [employer]  Tobacco Use   Smoking status: Never   Smokeless tobacco: Never  Vaping Use   Vaping Use: Never used  Substance and Sexual Activity   Alcohol use: Yes    Alcohol/week: 1.0 - 2.0 standard drink of  alcohol    Types: 1 - 2 Standard drinks or equivalent per week    Comment: social   Drug use: No   Sexual activity: Yes    Partners: Male    Birth control/protection: Post-menopausal, Other-see comments    Comment: husband vasectomy  Other Topics Concern   Not on file  Social History Narrative   Patient is married Health and safety inspector) and lives at home with her husband.   Patient does not have any children.   Patient is working for a [employer].   Patient  has a Bachelor's degree.   Patient is right-handed.   Patient does not drink any caffeine.      Right breast removed   Social Determinants of Health   Financial Resource Strain: Low Risk  (05/19/2021)   Overall Financial Resource Strain (CARDIA)    Difficulty of Paying Living Expenses: Not hard at all  Food Insecurity: No Food Insecurity (05/19/2021)   Hunger Vital Sign    Worried About Running Out of Food in the Last Year: Never true    Ran Out of Food in the Last Year: Never true  Transportation Needs: No Transportation Needs (05/19/2021)   PRAPARE - Hydrologist (Medical): No    Lack of Transportation (Non-Medical): No  Physical Activity: Unknown (05/19/2021)   Exercise Vital Sign    Days of Exercise per Week: 1 day    Minutes of Exercise per Session: Not on file  Stress: No Stress Concern Present (05/19/2021)   [place]    Feeling of Stress : Not at all  Social Connections: [place] (05/19/2021)   Social Connection and Isolation Panel [NHANES]    Frequency of Communication with Friends and Family: Three times a week    Frequency of Social Gatherings with Friends and Family: Twice a week    Attends Religious Services: 1 to 4 times per year    Active Member of [employer] or Organizations: Yes    Attends Music therapist: More than 4 times per year    Marital Status: Married  Human resources officer Violence: Not on file     Review of Systems: Pertinent positive and negative review of systems were noted in the above HPI section.  All other review of systems was otherwise negative.   Physical Exam: Vital signs in last 24 hours: Temp:  [97.9 F (36.6 C)-98.4 F (36.9 C)] 98.1 F (36.7 C) (03/31 1206) Pulse Rate:  [71-95] 95 (03/31 1400) Resp:  [12-24] 21 (03/31 1400) BP: (89-114)/(49-56) 108/56 (03/31 1400) SpO2:  [98 %-100 %] 99 % (03/31 1400)   General:   Alert,  Well-developed, chronically ill-appearing older white female pleasant and cooperative in NAD-bed at bedside Head:  Normocephalic and atraumatic. Eyes:  Sclera clear, no icterus.   Conjunctiva pale Ears:  Normal auditory acuity. Nose:  No deformity, discharge,  or lesions. Mouth:  No deformity or lesions.   Neck:  Supple; no masses or thyromegaly. Lungs:  Clear throughout to auscultation.   No wheezes, crackles, or rhonchi.  Heart:  Regular rate and rhythm; no murmurs, clicks, rubs,  or gallops. Abdomen:  Soft, large, no focal tenderness, bowel sounds are present, liver is large and palpable down 4 fingerbreadths below the right costal margin, nodular Rectal: Not done Msk:  Symmetrical without gross deformities. . Pulses:  Normal pulses noted. Extremities:  Without clubbing or edema. Neurologic:  Alert and  oriented x4;  grossly normal neurologically. Skin:  Intact without significant lesions or rashes.. Psych:  Alert and cooperative. Normal mood and affect.  Intake/Output from previous day: No intake/output data recorded. Intake/Output this shift: No intake/output data recorded.  Lab Results: Recent Labs    04/21/22 0919  WBC 34.2*  HGB 6.4*  HCT 20.9*  PLT 281   BMET Recent Labs    04/21/22 0919  NA 135  K 4.7  CL 105  CO2 19*  GLUCOSE 206*  BUN 62*  CREATININE 2.20*  CALCIUM 8.7*   LFT Recent Labs  04/21/22 0919  PROT 5.0*  ALBUMIN 2.7*  AST 59*  ALT 22  ALKPHOS 214*  BILITOT 0.7   PT/INR No  results for input(s): "LABPROT", "INR" in the last 72 hours. Hepatitis Panel No results for input(s): "HEPBSAG", "HCVAB", "HEPAIGM", "HEPBIGM" in the last 72 hours.    IMPRESSION:  #42 72 year old white female with acute upper GI bleed initially with emesis of a large blood clot, then earlier this morning and incontinence in the bathroom of a large amount of maroonish and dark blood Hemoglobin down about 4 g over the past 2 weeks  Patient is status post Roux-en-Y gastric bypass  Etiology of acute upper GI bleed is not clear she has been on steroids along with recent chemotherapy raising suspicion for anastomotic ulceration Cannot rule out bleeding secondary to metastatic disease involving the stomach has not had evidence of such on recent imaging  #2 breast cancer widely metastatic to the liver, new pulmonary metastases, extensive retroperitoneal adenopathy and ascites Undergoing chemotherapy  #3 marked leukocytosis-etiology not clear #4 chronic kidney disease #5 history of hypertension #6 elevated LFTs presumed secondary to extensive hepatic metastases  Plan; keep n.p.o. Patient will undergo upper endoscopy this afternoon with Dr. Hilarie Fredrickson.  Procedure was discussed in detail with the patient including indications risk and benefits and she is agreeable to proceed Order was placed for 1 unit of packed RBCs, added for second unit of packed RBCs Transfuse to keep hemoglobin closer to the 7.5-8 range PPI infusion has been started Further recommendations pending findings at EGD    [place] PA-C 04/21/2022, 2:52 PM

## 2022-04-22 ENCOUNTER — Inpatient Hospital Stay: Payer: Medicare PPO | Admitting: Hematology and Oncology

## 2022-04-22 ENCOUNTER — Other Ambulatory Visit: Payer: Self-pay | Admitting: Hematology and Oncology

## 2022-04-22 ENCOUNTER — Inpatient Hospital Stay: Payer: Medicare PPO

## 2022-04-22 ENCOUNTER — Ambulatory Visit: Payer: Medicare PPO

## 2022-04-22 ENCOUNTER — Inpatient Hospital Stay: Payer: Medicare PPO | Admitting: Nurse Practitioner

## 2022-04-22 DIAGNOSIS — K289 Gastrojejunal ulcer, unspecified as acute or chronic, without hemorrhage or perforation: Secondary | ICD-10-CM | POA: Diagnosis not present

## 2022-04-22 DIAGNOSIS — D72829 Elevated white blood cell count, unspecified: Secondary | ICD-10-CM

## 2022-04-22 DIAGNOSIS — E1129 Type 2 diabetes mellitus with other diabetic kidney complication: Secondary | ICD-10-CM

## 2022-04-22 DIAGNOSIS — N179 Acute kidney failure, unspecified: Secondary | ICD-10-CM

## 2022-04-22 DIAGNOSIS — I120 Hypertensive chronic kidney disease with stage 5 chronic kidney disease or end stage renal disease: Secondary | ICD-10-CM | POA: Diagnosis not present

## 2022-04-22 DIAGNOSIS — N184 Chronic kidney disease, stage 4 (severe): Secondary | ICD-10-CM | POA: Diagnosis not present

## 2022-04-22 DIAGNOSIS — C787 Secondary malignant neoplasm of liver and intrahepatic bile duct: Secondary | ICD-10-CM

## 2022-04-22 DIAGNOSIS — G629 Polyneuropathy, unspecified: Secondary | ICD-10-CM

## 2022-04-22 DIAGNOSIS — I10 Essential (primary) hypertension: Secondary | ICD-10-CM | POA: Diagnosis not present

## 2022-04-22 DIAGNOSIS — Z9884 Bariatric surgery status: Secondary | ICD-10-CM

## 2022-04-22 DIAGNOSIS — D62 Acute posthemorrhagic anemia: Secondary | ICD-10-CM | POA: Diagnosis not present

## 2022-04-22 DIAGNOSIS — K922 Gastrointestinal hemorrhage, unspecified: Secondary | ICD-10-CM

## 2022-04-22 DIAGNOSIS — R7989 Other specified abnormal findings of blood chemistry: Secondary | ICD-10-CM

## 2022-04-22 DIAGNOSIS — Z794 Long term (current) use of insulin: Secondary | ICD-10-CM

## 2022-04-22 DIAGNOSIS — E1169 Type 2 diabetes mellitus with other specified complication: Secondary | ICD-10-CM

## 2022-04-22 DIAGNOSIS — C50911 Malignant neoplasm of unspecified site of right female breast: Secondary | ICD-10-CM

## 2022-04-22 LAB — TYPE AND SCREEN
ABO/RH(D): AB POS
Antibody Screen: NEGATIVE
Unit division: 0
Unit division: 0

## 2022-04-22 LAB — CBC WITH DIFFERENTIAL/PLATELET
Abs Immature Granulocytes: 3.11 10*3/uL — ABNORMAL HIGH (ref 0.00–0.07)
Basophils Absolute: 0.2 10*3/uL — ABNORMAL HIGH (ref 0.0–0.1)
Basophils Relative: 1 %
Eosinophils Absolute: 0 10*3/uL (ref 0.0–0.5)
Eosinophils Relative: 0 %
HCT: 25.6 % — ABNORMAL LOW (ref 36.0–46.0)
Hemoglobin: 8.2 g/dL — ABNORMAL LOW (ref 12.0–15.0)
Immature Granulocytes: 8 %
Lymphocytes Relative: 3 %
Lymphs Abs: 1.2 10*3/uL (ref 0.7–4.0)
MCH: 31.8 pg (ref 26.0–34.0)
MCHC: 32 g/dL (ref 30.0–36.0)
MCV: 99.2 fL (ref 80.0–100.0)
Monocytes Absolute: 3 10*3/uL — ABNORMAL HIGH (ref 0.1–1.0)
Monocytes Relative: 8 %
Neutro Abs: 31.9 10*3/uL — ABNORMAL HIGH (ref 1.7–7.7)
Neutrophils Relative %: 80 %
Platelets: 312 10*3/uL (ref 150–400)
RBC: 2.58 MIL/uL — ABNORMAL LOW (ref 3.87–5.11)
RDW: 17.2 % — ABNORMAL HIGH (ref 11.5–15.5)
WBC: 39.5 10*3/uL — ABNORMAL HIGH (ref 4.0–10.5)
nRBC: 0.4 % — ABNORMAL HIGH (ref 0.0–0.2)

## 2022-04-22 LAB — CBC
HCT: 25.7 % — ABNORMAL LOW (ref 36.0–46.0)
HCT: 27.1 % — ABNORMAL LOW (ref 36.0–46.0)
Hemoglobin: 8.5 g/dL — ABNORMAL LOW (ref 12.0–15.0)
Hemoglobin: 8.8 g/dL — ABNORMAL LOW (ref 12.0–15.0)
MCH: 31.7 pg (ref 26.0–34.0)
MCH: 31.8 pg (ref 26.0–34.0)
MCHC: 32.5 g/dL (ref 30.0–36.0)
MCHC: 33.1 g/dL (ref 30.0–36.0)
MCV: 96.3 fL (ref 80.0–100.0)
MCV: 97.5 fL (ref 80.0–100.0)
Platelets: 267 10*3/uL (ref 150–400)
Platelets: 310 10*3/uL (ref 150–400)
RBC: 2.67 MIL/uL — ABNORMAL LOW (ref 3.87–5.11)
RBC: 2.78 MIL/uL — ABNORMAL LOW (ref 3.87–5.11)
RDW: 16.6 % — ABNORMAL HIGH (ref 11.5–15.5)
RDW: 17 % — ABNORMAL HIGH (ref 11.5–15.5)
WBC: 38.9 10*3/uL — ABNORMAL HIGH (ref 4.0–10.5)
WBC: 40.3 10*3/uL — ABNORMAL HIGH (ref 4.0–10.5)
nRBC: 0.4 % — ABNORMAL HIGH (ref 0.0–0.2)
nRBC: 0.5 % — ABNORMAL HIGH (ref 0.0–0.2)

## 2022-04-22 LAB — BASIC METABOLIC PANEL
Anion gap: 9 (ref 5–15)
BUN: 56 mg/dL — ABNORMAL HIGH (ref 8–23)
CO2: 19 mmol/L — ABNORMAL LOW (ref 22–32)
Calcium: 8.2 mg/dL — ABNORMAL LOW (ref 8.9–10.3)
Chloride: 107 mmol/L (ref 98–111)
Creatinine, Ser: 2.13 mg/dL — ABNORMAL HIGH (ref 0.44–1.00)
GFR, Estimated: 25 mL/min — ABNORMAL LOW (ref >60)
Glucose, Bld: 143 mg/dL — ABNORMAL HIGH (ref 70–99)
Potassium: 5.6 mmol/L — ABNORMAL HIGH (ref 3.5–5.1)
Sodium: 135 mmol/L (ref 135–145)

## 2022-04-22 LAB — HEMOGLOBIN AND HEMATOCRIT, BLOOD
HCT: 26 % — ABNORMAL LOW (ref 36.0–46.0)
Hemoglobin: 8.5 g/dL — ABNORMAL LOW (ref 12.0–15.0)

## 2022-04-22 LAB — GLUCOSE, CAPILLARY
Glucose-Capillary: 103 mg/dL — ABNORMAL HIGH (ref 70–99)
Glucose-Capillary: 264 mg/dL — ABNORMAL HIGH (ref 70–99)
Glucose-Capillary: 169 mg/dL — ABNORMAL HIGH (ref 70–99)
Glucose-Capillary: 186 mg/dL — ABNORMAL HIGH (ref 70–99)

## 2022-04-22 LAB — BPAM RBC
Blood Product Expiration Date: 202404222359
Blood Product Expiration Date: 202404222359
ISSUE DATE / TIME: 202403311142
ISSUE DATE / TIME: 202403311517
Unit Type and Rh: 6200
Unit Type and Rh: 6200

## 2022-04-22 LAB — PROTIME-INR
INR: 1.4 — ABNORMAL HIGH (ref 0.8–1.2)
Prothrombin Time: 17.1 seconds — ABNORMAL HIGH (ref 11.4–15.2)

## 2022-04-22 LAB — HIV ANTIBODY (ROUTINE TESTING W REFLEX): HIV Screen 4th Generation wRfx: NONREACTIVE

## 2022-04-22 LAB — POTASSIUM: Potassium: 5.4 mmol/L — ABNORMAL HIGH (ref 3.5–5.1)

## 2022-04-22 MED ORDER — SODIUM ZIRCONIUM CYCLOSILICATE 5 G PO PACK
5.0000 g | PACK | Freq: Two times a day (BID) | ORAL | Status: AC
  Administered 2022-04-22 (×2): 5 g via ORAL
  Filled 2022-04-22 (×2): qty 1

## 2022-04-22 MED ORDER — SODIUM CHLORIDE 0.9 % IV SOLN: INTRAVENOUS | Status: DC

## 2022-04-22 MED ORDER — LOPERAMIDE HCL 2 MG PO CAPS: 2.0000 mg | ORAL_CAPSULE | ORAL | Status: DC | PRN

## 2022-04-22 MED ORDER — VITAMIN D 25 MCG (1000 UNIT) PO TABS
1000.0000 [IU] | ORAL_TABLET | Freq: Every day | ORAL | Status: DC
  Administered 2022-04-22 – 2022-04-24 (×3): 1000 [IU] via ORAL
  Filled 2022-04-22 (×3): qty 1

## 2022-04-22 MED ORDER — MELATONIN 5 MG PO TABS
5.0000 mg | ORAL_TABLET | Freq: Every evening | ORAL | Status: DC | PRN
  Filled 2022-04-22: qty 1

## 2022-04-22 MED ORDER — PROCHLORPERAZINE MALEATE 10 MG PO TABS: 10.0000 mg | ORAL_TABLET | Freq: Four times a day (QID) | ORAL | Status: DC | PRN

## 2022-04-22 MED ORDER — MELATONIN 5 MG PO TABS
5.0000 mg | ORAL_TABLET | Freq: Every day | ORAL | Status: DC
  Administered 2022-04-22 – 2022-04-23 (×2): 5 mg via ORAL
  Filled 2022-04-22 (×2): qty 1

## 2022-04-22 MED ORDER — INSULIN ASPART 100 UNIT/ML IJ SOLN
0.0000 [IU] | Freq: Three times a day (TID) | INTRAMUSCULAR | Status: DC
  Administered 2022-04-22: 2 [IU] via SUBCUTANEOUS
  Administered 2022-04-22: 5 [IU] via SUBCUTANEOUS
  Administered 2022-04-23: 1 [IU] via SUBCUTANEOUS
  Administered 2022-04-23 (×2): 2 [IU] via SUBCUTANEOUS

## 2022-04-22 MED ORDER — CARVEDILOL 6.25 MG PO TABS
6.2500 mg | ORAL_TABLET | Freq: Two times a day (BID) | ORAL | Status: DC
  Administered 2022-04-22 – 2022-04-24 (×5): 6.25 mg via ORAL
  Filled 2022-04-22 (×6): qty 1

## 2022-04-22 NOTE — Progress Notes (Signed)
V.O received from Dr. Fuller [facility]. Pt may have soft diet in the morning.

## 2022-04-22 NOTE — Progress Notes (Addendum)
Daily Progress Note  DOA: 04/21/2022 Hospital Day: 2 Chief Complaint: Upper GI bleed   ASSESSMENT   72 year old female history of metastatic breast cancer with acute upper GI bleed secondary to anastomotic ulcer (likely steroid induced). See procedure report below. Hemoglobin currently stable. No further bleeding.  Elevated LFTs in setting of liver mets  CKD stage IV  PLAN   - Once PPI drip complete, can start p.o. twice daily PPI (omeprazole 40 Mg or lansoprazole 30 Mg).  Use capsule form and open capsule and administer granules with applesauce to enhance absorption given gastric bypass history. After 8 weeks may be able to reduce to once daily PPI. Will defer to Dr. Fuller Plan. -Can advance diet to full liquid now, and if tolerated can further advance to soft diet from GI standpoint   Attending Physician Note   I have taken an interval history, reviewed the chart and examined the patient. I performed a substantive portion of this encounter, including complete performance of at least one of the key components, in conjunction with the APP. I agree with the APP's note, impression and recommendations with my edits. My additional impressions and recommendations are as follows.   Assessment: *Anastomotic ulcers, no recurrent bleeding *ABL anemia *S/P Roux-en-Y  *Metastatic breast cancer with liver mets, elevated LFTs  Recommendations: *Advance diet *Trend CBC *If stable overnight OK for discharge tomorrow from GI standpoint *At discharge open PPI capsule, sprinkle granules in applesauce bid for 8 weeks then consider reducing to qd *Consider repeat EGD in 2-3 months to assess ulcer healing *GI signing off. Available as needed  *Outpatient GI follow up with Drs. Beavers or Pyrtle in 1 month  Lucio Edward, MD [facility] See Shea Evans, [facility], for our on call provider     Subjective / New events:  Patient reports increased gas. Denies nausea, vomiting, diarrhea. Denies abdominal  pain though does reports gas "discomfort." Tolerating clear liquids without difficulty. Has not had a bowel movement since yesterday.  GI Procedures - EGD 3/31: Normal esophagus, Roux-en-Y, large anastomotic ulcer with pigmented spot, hemostatic gel applied.  Unremarkable gastric pouch. No biopsies taken.   Imaging:  - CT of the abdomen and pelvis without contrast shows widespread hepatic metastases progressed from last exam, gallstones evidence of prior gastric bypass, nonspecific wall thickening in the sigmoid colon associated with underlying diverticulosis, she has multiple enlarged lymph nodes within the porta hepatis and upper retroperitoneum consistent with metastatic disease, there is a large portacaval node, and increased ascites compared to CT earlier this month.    Lab Results: Recent Labs    04/21/22 1827 04/22/22 0020 04/22/22 0837  WBC 36.4* 38.9* 40.3*  HGB 9.1*  9.0* 8.5*  8.5* 8.8*  HCT 27.9*  27.8* 26.0*  25.7* 27.1*  PLT 274 267 310   BMET Recent Labs    04/21/22 0919 04/22/22 0020 04/22/22 0837  NA 135 135  --   K 4.7 5.6* 5.4*  CL 105 107  --   CO2 19* 19*  --   GLUCOSE 206* 143*  --   BUN 62* 56*  --   CREATININE 2.20* 2.13*  --   CALCIUM 8.7* 8.2*  --    LFT Recent Labs    04/21/22 0919  PROT 5.0*  ALBUMIN 2.7*  AST 59*  ALT 22  ALKPHOS 214*  BILITOT 0.7   PT/INR Recent Labs    04/22/22 0020  LABPROT 17.1*  INR 1.4*     Scheduled inpatient medications:  Bempedoic Acid-Ezetimibe  1 tablet Oral Daily   buPROPion  200 mg Oral Daily   carvedilol  6.25 mg Oral BID   Chlorhexidine Gluconate Cloth  6 each Topical Daily   cholecalciferol  1,000 Units Oral Daily   docusate sodium  100 mg Oral BID   insulin aspart  0-9 Units Subcutaneous TID WC   insulin glargine-yfgn  5 Units Subcutaneous Daily   loratadine  10 mg Oral Daily   melatonin  5 mg Oral QHS   sodium chloride flush  10-40 mL Intracatheter Q12H   vortioxetine HBr  20 mg  Oral QHS   Continuous inpatient infusions:   sodium chloride 50 mL/hr at 04/22/22 0422   pantoprazole 8 mg/hr (04/22/22 0651)   PRN inpatient medications: acetaminophen **OR** acetaminophen, loperamide, melatonin, ondansetron **OR** ondansetron (ZOFRAN) IV, polyvinyl alcohol, prochlorperazine, sodium chloride flush, traZODone  Vital signs in last 24 hours: Temp:  [97.9 F (36.6 C)-99.9 F (37.7 C)] 98.2 F (36.8 C) (04/01 0427) Pulse Rate:  [65-95] 89 (04/01 0903) Resp:  [11-21] 19 (04/01 0427) BP: (94-135)/(44-64) 129/56 (04/01 0903) SpO2:  [92 %-100 %] 97 % (04/01 0427) Weight:  [79 kg] 79 kg (03/31 1515) Last BM Date : 04/21/22  Intake/Output Summary (Last 24 hours) at 04/22/2022 0943 Last data filed at 04/22/2022 0500 Gross per 24 hour  Intake 1561.51 ml  Output 200 ml  Net 1361.51 ml    Intake/Output from previous day: 03/31 0701 - 04/01 0700 In: 1561.5 [P.O.:240; I.V.:600.5; Blood:721] Out: 200 [Urine:200] Intake/Output this shift: No intake/output data recorded.   Physical Exam:  General: Alert female in NAD Heart:  Regular rate and rhythm.  Pulmonary: Normal respiratory effort Abdomen: Soft, slightly distended, nontender. Normal bowel sounds. Extremities: No lower extremity edema  Neurologic: Alert and oriented Psych: Pleasant. Cooperative. Insight appears normal.    Principal Problem:   GI bleed Active Problems:   Bariatric surgery status   Chemotherapy-induced peripheral neuropathy   Recurrent breast cancer, right   Diabetic retinopathy of both eyes associated with type 2 diabetes mellitus   Essential hypertension   Diabetes mellitus   Metastatic cancer to liver   Upper GI bleed   Acute blood loss anemia (ABLA)   AKI (acute kidney injury)   Leukocytosis   Acute anastomotic ulcer of gastrojejunal region     LOS: 1 day   [name] ,PA-C 04/22/2022, 9:43 AM

## 2022-04-22 NOTE — Progress Notes (Signed)
PROGRESS NOTE    Darlene [name]  [id number] DOB: 1950/08/01 DOA: 04/21/2022 PCP: Isaac Bliss, Rayford Halsted, MD    Chief Complaint  Patient presents with   GI Bleeding    Brief Narrative:  72 year old female history of metastatic breast cancer undergoing therapy, CKD, CHF, depression, type 2 diabetes, history of Roux-en-Y bypass presented to the ED for GI bleed.  Patient seen in consultation by gastroenterology, underwent upper endoscopy which revealed 2 ulcers in the proximity of the anastomotic line (1 bypass.  Coronary gel placed and bleeding ceased.  Patient transfused 2 units packed red blood cells.   Assessment & Plan:   Principal Problem:   GI bleed Active Problems:   Bariatric surgery status   Chemotherapy-induced peripheral neuropathy   Recurrent breast cancer, right   Diabetic retinopathy of both eyes associated with type 2 diabetes mellitus   Essential hypertension   Diabetes mellitus   Metastatic cancer to liver   Upper GI bleed   Acute blood loss anemia (ABLA)   AKI (acute kidney injury)   Leukocytosis   Acute anastomotic ulcer of gastrojejunal region  #1 upper GI bleed -Patient presented with GI bleed, seen by gastroenterology, underwent upper endoscopy that showed ulcer at the anastomosis of Roux-en-Y. -Felt likely secondary to steroids patient was recently placed on which has subsequently been discontinued. -Hemoglobin currently at 8.2 from 6.4 on admission, status post transfusion 2 units packed red blood cells. -Patient denies any further overt bleeding. -Continue PPI infusion. -Patient was on octreotide and has been discontinued. -Tolerating clear liquids and states diet has been advanced to full liquid diet per GI this morning. -GI recommending once patient oral PPI will need to discharge on omeprazole 40 mg of lansoprazole 30 mg capsule form and open capsule and administer granules with applesauce to enhance absorption given patient's gastric  bypass history.  GI recommended PPI twice daily for at least 8 weeks.  2.  Acute blood loss anemia -Secondary to problem #1. -Status post transfusion 2 units packed red blood cells hemoglobin currently at 8.2 from 6.4 on admission. -Follow H&H. -Transfusion threshold hemoglobin < 7.  3.  Hypertension -BP currently stable. -Continue to hold oral antihypertensive medications.  4.  Hyperkalemia -Potassium noted at 5.6 this morning with repeat at 5.4. -Lokelma twice daily.  5.  AKI on CKD stage IIIb -Baseline creatinine approximately 1.5-2. -Likely secondary to prerenal azotemia secondary to recent GI bleed. -Status post transfusion 2 units packed red blood cells. -Renal function slowly improving posttransfusion now with hydration.  6.  Leukocytosis -Likely secondary to G-CSF. -Patient with no signs or symptoms of infection. -Patient afebrile. -Outpatient follow-up with hematology/oncology.  7.  Recurrent right breast cancer with metastases to the liver -Outpatient follow-up with oncology.  8.  Diabetes mellitus type 2 -Hemoglobin A1c 7.4 (12/19/2021) -Repeat hemoglobin A1c. -CBG 264 this afternoon. -Continue current dose of Semglee, SSI.    DVT prophylaxis: SCDs Code Status: Full Family Communication: Updated patient, husband at bedside. Disposition: Likely home when clinically improved and cleared by gastroenterology.  Status is: Inpatient Remains inpatient appropriate because: Severity of illness   Consultants:  Gastroenterology: Dr. Hilarie Fredrickson 04/21/2022  Procedures:  CT abdomen and pelvis 04/21/2022 Upper endoscopy 04/21/2022--per Dr. Hilarie Fredrickson Transfusion 2 units PRBC 04/21/2022  Antimicrobials:  None   Subjective: Patient laying in bed.  States feels a whole lot better than she did on admission.  Stated had small red stool this morning.  No lightheadedness.  No chest pain.  No shortness of  breath.  Menu in hand.  Husband at bedside.  Objective: Vitals:    04/21/22 2345 04/22/22 0427 04/22/22 0903 04/22/22 1245  BP: (!) 111/49 135/64 (!) 129/56 110/62  Pulse: 65 88 89 (!) 52  Resp: 20 19  16   Temp: 99.9 F (37.7 C) 98.2 F (36.8 C)  (!) 97.2 F (36.2 C)  TempSrc: Oral Oral    SpO2: 98% 97%  99%  Weight:      Height:        Intake/Output Summary (Last 24 hours) at 04/22/2022 1828 Last data filed at 04/22/2022 1316 Gross per 24 hour  Intake 1960.51 ml  Output 400 ml  Net 1560.51 ml   Filed Weights   04/21/22 1515  Weight: 79 kg    Examination:  General exam: Appears calm and comfortable  Respiratory system: Clear to auscultation.  No wheezes, no crackles, no rhonchi.  Fair air movement.  Speaking in full sentences.  Respiratory effort normal. Cardiovascular system: S1 & S2 heard, RRR. No JVD, murmurs, rubs, gallops or clicks. No pedal edema. Gastrointestinal system: Abdomen is nondistended, soft and nontender. No organomegaly or masses felt. Normal bowel sounds heard. Central nervous system: Alert and oriented. No focal neurological deficits. Extremities: Symmetric 5 x 5 power. Skin: No rashes, lesions or ulcers Psychiatry: Judgement and insight appear normal. Mood & affect appropriate.     Data Reviewed: I have personally reviewed following labs and imaging studies  CBC: Recent Labs  Lab 04/21/22 0919 04/21/22 1827 04/22/22 0020 04/22/22 0837 04/22/22 1500  WBC 34.2* 36.4* 38.9* 40.3* 39.5*  NEUTROABS  --   --   --   --  PENDING  HGB 6.4* 9.1*  9.0* 8.5*  8.5* 8.8* 8.2*  HCT 20.9* 27.9*  27.8* 26.0*  25.7* 27.1* 25.6*  MCV 101.0* 96.5 96.3 97.5 99.2  PLT 281 274 267 310 [id number]    Basic Metabolic Panel: Recent Labs  Lab 04/21/22 0919 04/22/22 0020 04/22/22 0837  NA 135 135  --   K 4.7 5.6* 5.4*  CL 105 107  --   CO2 19* 19*  --   GLUCOSE 206* 143*  --   BUN 62* 56*  --   CREATININE 2.20* 2.13*  --   CALCIUM 8.7* 8.2*  --     GFR: Estimated Creatinine Clearance: 26.1 mL/min (A) (by C-G formula based  on SCr of 2.13 mg/dL (H)).  Liver Function Tests: Recent Labs  Lab 04/21/22 0919  AST 59*  ALT 22  ALKPHOS 214*  BILITOT 0.7  PROT 5.0*  ALBUMIN 2.7*    CBG: Recent Labs  Lab 04/21/22 1502 04/21/22 1802 04/21/22 2027 04/22/22 1408 04/22/22 1631  GLUCAP 103* 101* 200* 264* 169*     No results found for this or any previous visit (from the past 240 hour(s)).       Radiology Studies: CT ABDOMEN PELVIS WO CONTRAST  Result Date: 04/21/2022 CLINICAL DATA:  GI bleeding. History of metastatic breast cancer. * Tracking Code: BO * EXAM: CT ABDOMEN AND PELVIS WITHOUT CONTRAST TECHNIQUE: Multidetector CT imaging of the abdomen and pelvis was performed following the standard protocol without IV contrast. RADIATION DOSE REDUCTION: This exam was performed according to the departmental dose-optimization program which includes automated exposure control, adjustment of the mA and/or kV according to patient size and/or use of iterative reconstruction technique. COMPARISON:  PET-CT 04/19/2022. CT of the chest, abdomen and pelvis 03/23/2022. FINDINGS: Lower chest: Atherosclerosis of the aorta and coronary arteries. A small pericardial effusion  is unchanged. Precordial lymph node measuring 1 cm short axis on image 6/2 appears similar to recent CT. There is subpleural reticulation at both lung bases with scattered small pulmonary nodules, felt to be suspicious for metastatic disease on recent CT. Hepatobiliary: Widespread hepatic metastatic disease has progressed from previous CTs. Please refer to recent PET-CT. There are small calcified gallstones with mild chronic gallbladder wall thickening. No significant biliary dilatation. Pancreas: Unremarkable. No pancreatic ductal dilatation or surrounding inflammatory changes. Spleen: Normal in size without focal abnormality. Adrenals/Urinary Tract: The adrenal glands appear stable without suspicious findings. Diffuse renovascular calcifications are present  bilaterally. There are nonobstructing calculi in the lower pole of the left kidney measuring up to 7 mm in diameter. No evidence of ureteral calculus or hydronephrosis. The bladder appears unremarkable for its degree of distention. Stomach/Bowel: No enteric contrast administered. There are postsurgical changes from previous gastric bypass procedure. No small bowel distension, wall thickening or surrounding inflammation. There is mild nonspecific wall thickening in the sigmoid colon associated with underlying diverticulosis. No definite acute surrounding inflammation. No evidence of bowel obstruction. Vascular/Lymphatic: Multiple enlarged lymph nodes are present within the porta hepatis and upper retroperitoneum consistent with metastatic disease. There is a portacaval node measuring 5.0 x 2.5 cm on image 31/2. Diffuse aortic and branch vessel atherosclerosis without evidence of aneurysm. Reproductive: Calcified uterine fibroid on the right. No suspicious adnexal findings. Other: Increased ascites compared with CT done earlier this month, primarily in the pelvis and around the liver. This appears similar in volume to more recent PET-CT. No focal extraluminal fluid collection or pneumoperitoneum. Musculoskeletal: No acute osseous findings. No focal lytic or blastic lesions are identified. There are degenerative changes in the spine, greatest at L4-5. IMPRESSION: 1. No definite acute findings or explanation for GI bleeding on noncontrast CT. 2. Widespread hepatic metastatic disease has progressed from previous CTS. Upper abdominal lymphadenopathy consistent with metastatic disease. See recent PET-CT. 3. Increased ascites compared with CT done earlier this month, primarily in the pelvis and around the liver. This appears similar in volume to more recent PET-CT. 4. Sigmoid diverticulosis with mild nonspecific wall thickening. No evidence of bowel obstruction or perforation. 5. Nonobstructing left renal calculi. 6.  Cholelithiasis with chronic gallbladder wall thickening. 7.  Aortic Atherosclerosis (ICD10-I70.0). Electronically Signed   By: Richardean Sale M.D.   On: 04/21/2022 11:50        Scheduled Meds:  Bempedoic Acid-Ezetimibe  1 tablet Oral Daily   buPROPion  200 mg Oral Daily   carvedilol  6.25 mg Oral BID   Chlorhexidine Gluconate Cloth  6 each Topical Daily   cholecalciferol  1,000 Units Oral Daily   docusate sodium  100 mg Oral BID   insulin aspart  0-9 Units Subcutaneous TID WC   insulin glargine-yfgn  5 Units Subcutaneous Daily   loratadine  10 mg Oral Daily   melatonin  5 mg Oral QHS   sodium chloride flush  10-40 mL Intracatheter Q12H   sodium zirconium cyclosilicate  5 g Oral BID   vortioxetine HBr  20 mg Oral QHS   Continuous Infusions:  sodium chloride 75 mL/hr at 04/22/22 1245   pantoprazole 8 mg/hr (04/22/22 1540)     LOS: 1 day    Time spent: 35 minutes    Irine Seal, MD [facility]   To contact the attending provider between 7A-7P or the covering provider during after hours 7P-7A, please log into the web site [email or web address] and access using universal [place] password  for that web site. If you do not have the password, please call the hospital operator.  04/22/2022, 6:28 PM

## 2022-04-22 NOTE — Assessment & Plan Note (Deleted)
07/19/2014: Right breast cancer grade 2 IDC ER/PR negative HER2 positive Ki-67 90% treated with neoadjuvant chemo with TCHP x6 cycles followed by Herceptin maintenance, right lumpectomy had residual disease 4 cm 1/3 lymph node positive, ER/PR negative, adjuvant radiation and capecitabine completed 09/27/2015 12/06/2015: Recurrence: Right subpectoral lymph node and subcutaneous nodule, liver mass (biopsy adenocarcinoma consistent with upper GI or pancreaticobiliary primary, HER2 negative, ER negative) July 2019: Metastatic disease: TDM 1 followed by Herceptin maintenance, pertuzumab added 10/06/2018, Abraxane 12/31/2017-09/01/2018, Herceptin and pertuzumab maintenance   Prior treatment: Enhertu started 10/05/2020 switched to Herceptin maintenance 02/19/2021 switched to Enhertu at higher dosage x 3 cycles discontinued 08/01/2021 , Xeloda  started 11/12/2021 discontinued December 2023 for progression   02/04/2021: PET CT scan: Complete metabolic response, no evidence of hypermetabolic disease in the liver (mass measured 2.7 cm previously is now 1.5 cm with no uptake)    11/02/2021: CT CAP: Increase in the hepatic metastatic lesions (7.5 cm to 8.3 cm) similar size of aortocaval, periportal, gastrohepatic lymph nodes ------------------------------------------------------------------------------------------------------------------------------------------- CT CAP 01/18/2022: Progressive metastatic disease in the liver (10.7 cm x 13 cm) and lymph nodes in the upper abdomen and retroperitoneum (2.9 cm and 2.3 cm and 1.7 cm)   Current treatment: Carboplatin and gemcitabine started 04/02/2022   Tumor markers: Rising CEA levels are concerning that the cancer may not be responding to treatment 03/23/2022: CT CAP: Further progression of metastatic disease interval increase in size and number of mets in the liver, abdominal and retroperitoneal lymph nodes and mediastinal lymph nodes and numerous new pulmonary nodules    Current Treatment: Cycle 2 gemcitabine and carboplatin (scheduled for tomorrow) Y-90 Treatment  Chemo toxicities: Decadron causing hyperglycemia: We discontinued IV Decadron today. Severe generalized fatigue and weakness: Patient is using a wheelchair today. Mild nausea: Improved with antinausea medication.   Return to clinic in 2 weeks for cycle 2.

## 2022-04-22 NOTE — Progress Notes (Deleted)
[place]  [phone] [phone]) [phone]   Name: Darlene [name] Date: 04/22/2022 MRN: [id number]  DOB: 1950-10-03  Patient Care Team: Isaac Bliss, Rayford Halsted, MD as PCP - General (Internal Medicine) Larey Dresser, MD as PCP - Advanced Heart Failure (Cardiology) Stark Klein, MD as Consulting Physician (General Surgery) Jacelyn Pi, MD as Consulting Physician (Endocrinology) [name], MD as Consulting Physician (Plastic Surgery) Gery Pray, MD as Consulting Physician (Radiation Oncology) Juanita Craver, MD as Consulting Physician (Gastroenterology) Alda Berthold, DO as Consulting Physician (Neurology) Starlyn Skeans, MD as Consulting Physician (Family Medicine) Sherlynn Stalls, MD as Consulting Physician (Ophthalmology) Salvadore Dom, MD as Consulting Physician (Obstetrics and Gynecology) Raina Mina, RPH-CPP (Pharmacist) Nicholas Lose, MD as Consulting Physician (Hematology and Oncology)    REASON FOR CONSULTATION: Darlene [name] is a 72 y.o. female with oncologic medical history including ***.  Palliative ask to see for symptom management and goals of care.    SOCIAL HISTORY:     reports that she has never smoked. She has never used smokeless tobacco. She reports current alcohol use of about 1.0 - 2.0 standard drink of alcohol per week. She reports that she does not use drugs.  ADVANCE DIRECTIVES:  ***  CODE STATUS: {Palliative Code status:23503}  PAST MEDICAL HISTORY: Past Medical History:  Diagnosis Date   Anemia    hx of - during 1st round chemo   Anxiety    Arthritis    in fingers, shoulders   Back pain    Balance problem    Breast cancer ([place])    Breast cancer of upper-outer quadrant of right female breast ([place]) 07/22/2014   Bunion, right foot    CHF (congestive heart failure) (HCC)    Chronic kidney disease    creatininei levels high per pt    Clumsiness     Constipation    Depression    Diabetes mellitus without complication ([place])    123456 years type 2    Diabetic retinopathy ([place])    torn retina   Eating disorder    binge eating   Epistaxis 08/26/2014   Fatty liver    Foot drop, left foot    HCAP (healthcare-associated pneumonia) 12/18/2014   Heart murmur    never had any problems   History of radiation therapy 04/05/15-05/19/15   right chest wall was treated to 50.4 Gy in 28 fractions, right mastectomy scar was treated to 10 Gy in 5 fractions   Hyperlipidemia    Hypertension    Joint pain    Multiple food allergies    Neuromuscular disorder ([place])    diabetic neuropathy   Obesity    Obstructive sleep apnea on CPAP    does not use cpap all the time has lost 160 lbs    Ovarian cyst rupture    possible   Pneumonia    Pneumonia 11/2014   Radiation 04/05/15-05/19/15   right chest wall 50.4 Gy, mastectomy scar 10 Gy   Sleep apnea    Thyroid nodule 06/2014    PAST SURGICAL HISTORY:  Past Surgical History:  Procedure Laterality Date   ANAL RECTAL MANOMETRY N/A 08/01/2021   Procedure: ANO RECTAL MANOMETRY;  Surgeon: Thornton Park, MD;  Location: [facility];  Service: Gastroenterology;  Laterality: N/A;   APPENDECTOMY     BIOPSY THYROID Bilateral 06/2014   2 nodules - Benign    BREAST LUMPECTOMY WITH RADIOACTIVE SEED AND SENTINEL LYMPH NODE BIOPSY  Right 12/27/2014   Procedure: BREAST LUMPECTOMY WITH RADIOACTIVE SEED AND SENTINEL LYMPH NODE BIOPSY;  Surgeon: Stark Klein, MD;  Location: [place];  Service: General;  Laterality: Right;   BREAST REDUCTION SURGERY Bilateral 01/06/2015   Procedure: BILATERAL BREAST REDUCTION, RIGHT ONCOPLASTIC RECONSTRUCTION),LEFT BREAST REDUCTION FOR SYMMETRY;  Surgeon: [name], MD;  Location: [place];  Service: Plastics;  Laterality: Bilateral;   BREATH TEK H PYLORI N/A 09/15/2012   Procedure: BREATH TEK H PYLORI;  Surgeon: Pedro Earls, MD;  Location: [facility];  Service:  General;  Laterality: N/A;   BUNIONECTOMY Left 12/2013   CARPAL TUNNEL RELEASE Right    CARPAL TUNNEL RELEASE Left    CATARACT EXTRACTION     6/19   COLONOSCOPY     DUPUYTREN CONTRACTURE RELEASE     GASTRIC ROUX-EN-Y N/A 11/02/2012   Procedure: LAPAROSCOPIC ROUX-EN-Y GASTRIC;  Surgeon: Pedro Earls, MD;  Location: [facility];  Service: General;  Laterality: N/A;   IR GENERIC HISTORICAL  03/18/2016   IR US GUIDE VASC ACCESS LEFT 03/18/2016 Markus Daft, MD [facility]   IR GENERIC HISTORICAL  03/18/2016   IR FLUORO GUIDE CV LINE LEFT 03/18/2016 Markus Daft, MD [facility]   LAPAROSCOPIC APPENDECTOMY N/A 07/22/2015   Procedure: APPENDECTOMY LAPAROSCOPIC;  Surgeon: Michael Boston, MD;  Location: [facility];  Service: General;  Laterality: N/A;   MASTECTOMY Right 02/15/2015   modified   MODIFIED MASTECTOMY Right 02/15/2015   Procedure: RIGHT MODIFIED RADICAL MASTECTOMY;  Surgeon: Stark Klein, MD;  Location: [place];  Service: General;  Laterality: Right;   ORIF WRIST FRACTURE Left 05/11/2019   Procedure: OPEN REDUCTION INTERNAL FIXATION (ORIF) left distal radius fracture;  Surgeon: Renette Butters, MD;  Location: [facility];  Service: Orthopedics;  Laterality: Left;  with block   PORT-A-CATH REMOVAL Left 10/10/2015   Procedure: PORT REMOVAL;  Surgeon: Stark Klein, MD;  Location: [place];  Service: General;  Laterality: Left;   PORTACATH PLACEMENT Left 08/02/2014   Procedure: INSERTION PORT-A-CATH;  Surgeon: Stark Klein, MD;  Location: [place];  Service: General;  Laterality: Left;   PORTACATH PLACEMENT N/A 11/05/2016   Procedure: INSERTION PORT-A-CATH;  Surgeon: Stark Klein, MD;  Location: [place];  Service: General;  Laterality: N/A;   RETINAL LASER PROCEDURE     retina tear on left eye   ROTATOR CUFF REPAIR Right 2005   SCAR REVISION Right 12/08/2015   Procedure: COMPLEX REPAIR RIGHT CHEST 10-15CM;  Surgeon: [name], MD;  Location: [place];  Service: Plastics;   Laterality: Right;   TOE SURGERY  1970s   bone spur   TRIGGER FINGER RELEASE     x3    HEMATOLOGY/ONCOLOGY HISTORY:  Oncology History  Breast cancer of upper-outer quadrant of right female breast  07/19/2014 Initial Biopsy   (R) breast needle biopsy (10:00): IDC, DCIS, grade 2. ER-, PR-, HER2+ (ratio 2.42). Ki67 90%.    07/22/2014 Initial Diagnosis   Breast cancer of upper-outer quadrant of right female breast   08/08/2014 - 11/21/2014 Neo-Adjuvant Chemotherapy   Taxotere/Carbo/Herceptin/Perjeta x 6 cycles.    12/12/2014 - 10/02/2015 Chemotherapy   Maintenance Herceptin (to complete 1 year of therapy).    12/27/2014 Surgery   (R) lumpectomy with SLNB [facility]): IDC, grade 2, spanning 4 cm, associated high grade DCIS. Negative margins. 1/3 SLN (+).   ER/PR repeated and remain negative.   ypT2, ypN1a: Stage IIB   01/06/2015 Surgery   Bilateral breast mammoplasty ([place]): Right  breast path with intralymphatic emboli of ductal carcinoma. Left breast benign.    02/02/2015 Imaging   CT chest: No findings of metastatic disease in the chest. Right axillary fluid density lesion likely postoperative seroma or hematoma. Small left subpectoral nodes warrant followup attention.   02/02/2015 Imaging   Bone scan: No scintigraphic evidence of osseous metastatic disease   02/16/2015 Surgery   (R) mastectomy and ALND [facility]): Scattered foci of high grade DCIS, scattered intralymphatic tumor emboli. Margins negative. 0/15 LNs.    04/05/2015 - 05/19/2015 Radiation Therapy   Adjuvant XRT ([place]). The right chest wall was treated to 50.4 Gy in 28 fractions at 1.8 Gy per fraction.  The right mastectomy scar was treated to 10 Gy in 5 fractions at 2 Gy per fraction.    12/06/2015 PET scan   Right subpectoral lymph node measuring 9 mm and exhibits malignant range FDG uptake, suspicious for metastatic adenopathy. Subcutaneous nodule within the upper left chest wall exhibits malignant range uptake, which is  indeterminate; this is at the site of previous Port-A-Cath and is favored to represent postsurgical change. Indeterminate left adrenal nodules which exhibit mild uptake, favored to represent benign adenoma.   08/11/2017 - 11/03/2017 Chemotherapy   The patient had ado-trastuzumab emtansine (KADCYLA) 320 mg in sodium chloride 0.9 % 250 mL chemo infusion, 3.6 mg/kg = 320 mg, Intravenous, Once, 4 of 5 cycles Administration: 320 mg (08/12/2017), 320 mg (09/01/2017), 320 mg (09/23/2017)  for chemotherapy treatment.    11/18/2017 - 08/30/2020 Chemotherapy   The patient had trastuzumab (HERCEPTIN) 546 mg in sodium chloride 0.9 % 250 mL chemo infusion, 6 mg/kg = 546 mg (100 % of original dose 6 mg/kg), Intravenous,  Once, 2 of 2 cycles Dose modification: 6 mg/kg (original dose 6 mg/kg, Cycle 1, Reason: Provider Judgment) Administration: 546 mg (11/18/2017), 546 mg (12/16/2017) pertuzumab (PERJETA) 420 mg in sodium chloride 0.9 % 250 mL chemo infusion, 420 mg (100 % of original dose 420 mg), Intravenous, Once, 15 of 15 cycles Dose modification: 420 mg (original dose 420 mg, Cycle 18), 420 mg (original dose 420 mg, Cycle 27) Administration: 420 mg (10/06/2018), 420 mg (10/27/2018), 420 mg (11/18/2018), 420 mg (12/08/2018), 420 mg (12/29/2018), 420 mg (01/19/2019), 420 mg (02/16/2019), 420 mg (03/16/2019), 420 mg (04/13/2019), 420 mg (06/08/2019), 420 mg (07/06/2019), 420 mg (08/03/2019), 420 mg (08/31/2019), 420 mg (09/28/2019), 420 mg (05/19/2019) trastuzumab-anns (KANJINTI) 546 mg in sodium chloride 0.9 % 250 mL chemo infusion, 6 mg/kg = 546 mg (100 % of original dose 6 mg/kg), Intravenous,  Once, 30 of 30 cycles Dose modification: 6 mg/kg (original dose 6 mg/kg, Cycle 3, Reason: Other (see comments), Comment: requested step therapy by [facility] insurance), 4 mg/kg (original dose 6 mg/kg, Cycle 9, Reason: Provider Judgment), 6 mg/kg (original dose 6 mg/kg, Cycle 17, Reason: Provider Judgment), 4 mg/kg (original dose 6 mg/kg, Cycle 27,  Reason: Provider Judgment), 6 mg/kg (original dose 6 mg/kg, Cycle 27, Reason: Provider Judgment) Administration: 546 mg (01/13/2018), 546 mg (02/10/2018), 546 mg (03/03/2018), 546 mg (03/24/2018), 546 mg (04/14/2018), 546 mg (05/05/2018), 357 mg (05/26/2018), 357 mg (06/09/2018), 357 mg (06/23/2018), 357 mg (07/07/2018), 357 mg (07/21/2018), 357 mg (08/04/2018), 357 mg (08/18/2018), 357 mg (09/01/2018), 546 mg (09/15/2018), 546 mg (10/06/2018), 546 mg (10/27/2018), 546 mg (11/18/2018), 546 mg (12/08/2018), 546 mg (12/29/2018), 546 mg (01/19/2019), 546 mg (02/16/2019), 480 mg (03/16/2019), 483 mg (04/13/2019), 450 mg (06/08/2019), 450 mg (07/06/2019), 450 mg (08/03/2019), 450 mg (08/31/2019), 450 mg (09/28/2019)  for chemotherapy treatment.  12/31/2017 - 09/01/2018 Chemotherapy   The patient had PACLitaxel-protein bound (ABRAXANE) chemo infusion 175 mg, 80 mg/m2 = 175 mg (100 % of original dose 80 mg/m2), Intravenous,  Once, 5 of 10 cycles Dose modification: 80 mg/m2 (original dose 80 mg/m2, Cycle 11) Administration: 175 mg (07/07/2018), 175 mg (07/21/2018), 175 mg (08/04/2018), 175 mg (08/18/2018), 175 mg (09/01/2018) PACLitaxel-protein bound (ABRAXANE) chemo infusion 200 mg, 100 mg/m2 = 200 mg, Intravenous, Once, 10 of 10 cycles Dose modification: 80 mg/m2 (original dose 100 mg/m2, Cycle 2, Reason: Provider Judgment), 80 mg/m2 (original dose 100 mg/m2, Cycle 2, Reason: Provider Judgment) Administration: 200 mg (12/31/2017), 200 mg (01/06/2018), 175 mg (01/20/2018), 175 mg (01/27/2018), 175 mg (02/10/2018), 175 mg (03/03/2018), 175 mg (03/10/2018), 175 mg (03/24/2018), 175 mg (04/07/2018), 175 mg (04/14/2018), 175 mg (04/28/2018), 175 mg (05/05/2018), 175 mg (05/26/2018), 175 mg (06/09/2018), 175 mg (05/19/2018)  for chemotherapy treatment.    10/05/2020 - 01/18/2021 Chemotherapy   Patient is on Treatment Plan : BREAST METASTATIC fam-trastuzumab deruxtecan-nxki (Enhertu) q21d     02/19/2021 - 05/14/2021 Chemotherapy   Patient is on Treatment Plan :  BREAST Trastuzumab q21d x 13 cycles     06/20/2021 - 08/01/2021 Chemotherapy   Patient is on Treatment Plan : BREAST METASTATIC fam-trastuzumab deruxtecan-nxki (Enhertu) q21d     07/25/2021 - 10/19/2021 Chemotherapy   Patient is on Treatment Plan : BREAST METASTATIC Sacituzumab govitecan-hziy Ivette Loyal) D1,8 q21d     08/31/2021 - 09/21/2021 Chemotherapy   Patient is on Treatment Plan : BREAST METASTATIC Sacituzumab govitecan-hziy Ivette Loyal) q21d     01/30/2022 - 03/18/2022 Chemotherapy   Patient is on Treatment Plan : BREAST METASTATIC Eribulin D1,8 q21d     04/02/2022 -  Chemotherapy   Patient is on Treatment Plan : BREAST Carboplatin + Gemcitabine D1,8 q21d     Metastasis to liver  08/04/2017 Initial Diagnosis   Metastasis to liver ([place])   08/12/2017 - 10/14/2017 Chemotherapy   The patient had ado-trastuzumab emtansine (KADCYLA) 320 mg in sodium chloride 0.9 % 250 mL chemo infusion, 3.6 mg/kg = 320 mg, Intravenous, Once, 4 of 5 cycles Administration: 320 mg (08/12/2017), 320 mg (09/01/2017), 320 mg (09/23/2017)  for chemotherapy treatment.    11/18/2017 - 08/30/2020 Chemotherapy   The patient had trastuzumab (HERCEPTIN) 546 mg in sodium chloride 0.9 % 250 mL chemo infusion, 6 mg/kg = 546 mg (100 % of original dose 6 mg/kg), Intravenous,  Once, 2 of 2 cycles Dose modification: 6 mg/kg (original dose 6 mg/kg, Cycle 1, Reason: Provider Judgment) Administration: 546 mg (11/18/2017), 546 mg (12/16/2017) pertuzumab (PERJETA) 420 mg in sodium chloride 0.9 % 250 mL chemo infusion, 420 mg (100 % of original dose 420 mg), Intravenous, Once, 15 of 15 cycles Dose modification: 420 mg (original dose 420 mg, Cycle 18), 420 mg (original dose 420 mg, Cycle 27) Administration: 420 mg (10/06/2018), 420 mg (10/27/2018), 420 mg (11/18/2018), 420 mg (12/08/2018), 420 mg (12/29/2018), 420 mg (01/19/2019), 420 mg (02/16/2019), 420 mg (03/16/2019), 420 mg (04/13/2019), 420 mg (06/08/2019), 420 mg (07/06/2019), 420 mg (08/03/2019),  420 mg (08/31/2019), 420 mg (09/28/2019), 420 mg (05/19/2019) trastuzumab-anns (KANJINTI) 546 mg in sodium chloride 0.9 % 250 mL chemo infusion, 6 mg/kg = 546 mg (100 % of original dose 6 mg/kg), Intravenous,  Once, 30 of 30 cycles Dose modification: 6 mg/kg (original dose 6 mg/kg, Cycle 3, Reason: Other (see comments), Comment: requested step therapy by [facility] insurance), 4 mg/kg (original dose 6 mg/kg, Cycle 9, Reason: Provider Judgment), 6 mg/kg (original dose 6  mg/kg, Cycle 17, Reason: Provider Judgment), 4 mg/kg (original dose 6 mg/kg, Cycle 27, Reason: Provider Judgment), 6 mg/kg (original dose 6 mg/kg, Cycle 27, Reason: Provider Judgment) Administration: 546 mg (01/13/2018), 546 mg (02/10/2018), 546 mg (03/03/2018), 546 mg (03/24/2018), 546 mg (04/14/2018), 546 mg (05/05/2018), 357 mg (05/26/2018), 357 mg (06/09/2018), 357 mg (06/23/2018), 357 mg (07/07/2018), 357 mg (07/21/2018), 357 mg (08/04/2018), 357 mg (08/18/2018), 357 mg (09/01/2018), 546 mg (09/15/2018), 546 mg (10/06/2018), 546 mg (10/27/2018), 546 mg (11/18/2018), 546 mg (12/08/2018), 546 mg (12/29/2018), 546 mg (01/19/2019), 546 mg (02/16/2019), 480 mg (03/16/2019), 483 mg (04/13/2019), 450 mg (06/08/2019), 450 mg (07/06/2019), 450 mg (08/03/2019), 450 mg (08/31/2019), 450 mg (09/28/2019)  for chemotherapy treatment.    01/30/2022 - 03/18/2022 Chemotherapy   Patient is on Treatment Plan : BREAST METASTATIC Eribulin D1,8 q21d     04/02/2022 -  Chemotherapy   Patient is on Treatment Plan : BREAST Carboplatin + Gemcitabine D1,8 q21d       ALLERGIES:  is allergic to antihistamines, diphenhydramine-type; nsaids; peanut allergen powder-dnfp; and tape.  MEDICATIONS:  No current facility-administered medications for this visit.   No current outpatient medications on file.   Facility-Administered Medications Ordered in Other Visits  Medication Dose Route Frequency Provider Last Rate Last Admin   0.9 %  sodium chloride infusion   Intravenous Continuous Kathryne Eriksson, NP  50 mL/hr at 04/22/22 0422 New Bag at 04/22/22 0422   acetaminophen (TYLENOL) 325 MG tablet            acetaminophen (TYLENOL) tablet 650 mg  650 mg Oral Q6H PRN Hollice Gong, Mir M, MD       Or   acetaminophen (TYLENOL) suppository 650 mg  650 mg Rectal Q6H PRN Hollice Gong, Mir M, MD       Bempedoic Acid-Ezetimibe 180-10 MG TABS 1 tablet  1 tablet Oral Daily Hollice Gong, Mir M, MD       buPROPion ER [facility] SR) 12 hr tablet 200 mg  200 mg Oral Daily Hollice Gong, Mir M, MD   200 mg at 04/21/22 1859   Chlorhexidine Gluconate Cloth 2 % PADS 6 each  6 each Topical Daily Hollice Gong, Mir M, MD   6 each at 04/21/22 2029   diphenhydrAMINE (BENADRYL) 50 MG/ML injection            docusate sodium (COLACE) capsule 100 mg  100 mg Oral BID Hollice Gong, Mir M, MD       famotidine (PEPCID) 20-0.9 MG/50ML-% IVPB            insulin glargine-yfgn (SEMGLEE) injection 5 Units  5 Units Subcutaneous Daily Hollice Gong, Mir M, MD   5 Units at 04/21/22 1859   loratadine (CLARITIN) tablet 10 mg  10 mg Oral Daily Hollice Gong, Mir M, MD   10 mg at 04/21/22 1858   melatonin tablet 5 mg  5 mg Oral QHS PRN Kathryne Eriksson, NP       ondansetron [facility]) tablet 4 mg  4 mg Oral Q6H PRN Hollice Gong, Mir M, MD       Or   ondansetron [facility]) injection 4 mg  4 mg Intravenous Q6H PRN Hollice Gong, Mir M, MD       palonosetron (ALOXI) 0.25 MG/5ML injection            pantoprozole (PROTONIX) 80 mg /NS 100 mL infusion  8 mg/hr Intravenous Continuous Regan Lemming, MD 10 mL/hr at 04/22/22 0651 8 mg/hr at 04/22/22 0651   polyvinyl alcohol (LIQUIFILM TEARS) 1.4 % ophthalmic solution  Both Eyes TID PRN Hollice Gong, Mir M, MD       sodium chloride flush (NS) 0.9 % injection 10-40 mL  10-40 mL Intracatheter Q12H Hollice Gong, Mir M, MD       sodium chloride flush (NS) 0.9 % injection 10-40 mL  10-40 mL Intracatheter PRN Hollice Gong, Mir M, MD       traZODone (DESYREL) tablet 25 mg  25 mg Oral QHS PRN Hollice Gong, Mir M, MD   25 mg at 04/21/22 2318    vortioxetine HBr (TRINTELLIX) tablet 20 mg  20 mg Oral QHS Hollice Gong, Mir M, MD   20 mg at 04/21/22 2023    VITAL SIGNS: LMP 10/22/2007  There were no vitals filed for this visit.  Estimated body mass index is 25.71 kg/m as calculated from the following:   Height as of 04/21/22: 5\' 9"  (1.753 m).   Weight as of 04/21/22: 174 lb 2 oz (79 kg).  LABS: CBC:    Component Value Date/Time   WBC 38.9 (H) 04/22/2022 0020   HGB 8.5 (L) 04/22/2022 0020   HGB 8.5 (L) 04/22/2022 0020   HGB 10.8 (L) 04/08/2022 1018   HGB 10.8 (L) 01/07/2017 0758   HCT 26.0 (L) 04/22/2022 0020   HCT 25.7 (L) 04/22/2022 0020   HCT 31.9 (L) 01/07/2017 0758   PLT 267 04/22/2022 0020   PLT 193 04/08/2022 1018   PLT 126 (L) 01/07/2017 0758   MCV 96.3 04/22/2022 0020   MCV 90.8 01/07/2017 0758   NEUTROABS 6.4 04/08/2022 1018   NEUTROABS 2.9 01/07/2017 0758   LYMPHSABS 0.5 (L) 04/08/2022 1018   LYMPHSABS 1.1 01/07/2017 0758   MONOABS 0.1 04/08/2022 1018   MONOABS 0.3 01/07/2017 0758   EOSABS 0.0 04/08/2022 1018   EOSABS 0.1 01/07/2017 0758   EOSABS 0.1 08/09/2016 1229   BASOSABS 0.0 04/08/2022 1018   BASOSABS 0.0 01/07/2017 0758   Comprehensive Metabolic Panel:    Component Value Date/Time   NA 135 04/22/2022 0020   NA 145 (H) 01/04/2020 0951   NA 140 01/07/2017 0758   K 5.6 (H) 04/22/2022 0020   K 4.1 01/07/2017 0758   CL 107 04/22/2022 0020   CO2 19 (L) 04/22/2022 0020   CO2 24 01/07/2017 0758   BUN 56 (H) 04/22/2022 0020   BUN 33 (H) 01/04/2020 0951   BUN 33.6 (H) 01/07/2017 0758   CREATININE 2.13 (H) 04/22/2022 0020   CREATININE 1.62 (H) 04/08/2022 1018   CREATININE 1.5 (H) 01/07/2017 0758   GLUCOSE 143 (H) 04/22/2022 0020   GLUCOSE 134 01/07/2017 0758   CALCIUM 8.2 (L) 04/22/2022 0020   CALCIUM 9.0 04/12/2020 0923   CALCIUM 9.3 01/07/2017 0758   AST 59 (H) 04/21/2022 0919   AST 88 (H) 04/08/2022 1018   AST 51 (H) 01/07/2017 0758   ALT 22 04/21/2022 0919   ALT 35 04/08/2022 1018    ALT 82 (H) 01/07/2017 0758   ALKPHOS 214 (H) 04/21/2022 0919   ALKPHOS 115 01/07/2017 0758   BILITOT 0.7 04/21/2022 0919   BILITOT 0.6 04/08/2022 1018   BILITOT 0.72 01/07/2017 0758   PROT 5.0 (L) 04/21/2022 0919   PROT 6.3 01/04/2020 0951   PROT 6.7 01/07/2017 0758   ALBUMIN 2.7 (L) 04/21/2022 0919   ALBUMIN 4.3 01/04/2020 0951   ALBUMIN 3.6 01/07/2017 0758    RADIOGRAPHIC STUDIES: NM PET Image Restage (PS) Skull Base to Thigh (F-18 FDG)  Result Date: 04/22/2022 CLINICAL DATA:  Subsequent treatment strategy for breast cancer with liver  metastases. EXAM: NUCLEAR MEDICINE PET SKULL BASE TO THIGH TECHNIQUE: 8.6 mCi F-18 FDG was injected intravenously. Full-ring PET imaging was performed from the skull base to thigh after the radiotracer. CT data was obtained and used for attenuation correction and anatomic localization. Fasting blood glucose: 99 mg/dl COMPARISON:  CT chest abdomen pelvis dated 03/23/2022 FINDINGS: Mediastinal blood pool activity: SUV max 1.5 Liver activity: SUV max NA NECK: No hypermetabolic cervical lymphadenopathy. Incidental CT findings: None. CHEST: Status post right mastectomy. Left supraclavicular nodal metastases measuring up to 14 mm short axis at the left thoracic inlet, max SUV 18.4. 10 mm short axis subcarinal nodal metastasis a max SUV 4.6. Associated hypermetabolism in the right hilar region, max SUV 3.7. When compared to recent CT chest, this appearance is unchanged. Evaluation lung parenchyma is constrained by respiratory motion. The patient's small bilateral pulmonary nodules noted on recent CT are not well visualized on the current study. No associated hypermetabolism, although the nodules were beneath the size threshold for PET sensitivity. Left chest port terminates at the cavoatrial junction. Incidental CT findings: Atherosclerotic calcifications of the aortic arch. Severe three-vessel coronary atherosclerosis. ABDOMEN/PELVIS: Widespread/diffuse hepatic metastases  including a large aggregate metastasis in the central liver. Associated hypermetabolism, max SUV 6.5. Although the unenhanced CT is difficult to compare to the prior enhanced study, this appears progressive. No abnormal hypermetabolism in the pancreas, spleen, or adrenal glands. Widespread upper abdominal and periaortic nodal metastases. Representative 2.9 cm short axis portacaval node, grossly unchanged, max SUV 15.7. Overall appearance is similar. Small volume perihepatic and pelvic ascites, mildly progressive. No associated hypermetabolism. Incidental CT findings: Cholelithiasis. 7 mm nonobstructing left lower pole renal calculus. Gastric bypass with gastrojejunostomy. Atherosclerotic calcifications the abdominal aorta and branch vessels. Sigmoid diverticulosis, without evidence of diverticulitis. Calcified uterine fibroids. SKELETON: Diffuse hypermetabolism throughout the visualized axial and appendicular skeleton, favored to reflect marrow stimulation. No focal hypermetabolism to suggest osseous metastases. Incidental CT findings: Degenerative changes of the visualized thoracolumbar spine. IMPRESSION: Widespread hepatic metastases, progressive. Thoracoabdominal nodal metastases, as above, grossly unchanged. Small bilateral pulmonary nodules noted on recent CT are not well visualized on the current study, possibly secondary to motion degradation. Small volume perihepatic and pelvic ascites, mildly progressive. Additional ancillary findings as above. Electronically Signed   By: Julian Hy M.D.   On: 04/22/2022 01:27   CT ABDOMEN PELVIS WO CONTRAST  Result Date: 04/21/2022 CLINICAL DATA:  GI bleeding. History of metastatic breast cancer. * Tracking Code: BO * EXAM: CT ABDOMEN AND PELVIS WITHOUT CONTRAST TECHNIQUE: Multidetector CT imaging of the abdomen and pelvis was performed following the standard protocol without IV contrast. RADIATION DOSE REDUCTION: This exam was performed according to the  departmental dose-optimization program which includes automated exposure control, adjustment of the mA and/or kV according to patient size and/or use of iterative reconstruction technique. COMPARISON:  PET-CT 04/19/2022. CT of the chest, abdomen and pelvis 03/23/2022. FINDINGS: Lower chest: Atherosclerosis of the aorta and coronary arteries. A small pericardial effusion is unchanged. Precordial lymph node measuring 1 cm short axis on image 6/2 appears similar to recent CT. There is subpleural reticulation at both lung bases with scattered small pulmonary nodules, felt to be suspicious for metastatic disease on recent CT. Hepatobiliary: Widespread hepatic metastatic disease has progressed from previous CTs. Please refer to recent PET-CT. There are small calcified gallstones with mild chronic gallbladder wall thickening. No significant biliary dilatation. Pancreas: Unremarkable. No pancreatic ductal dilatation or surrounding inflammatory changes. Spleen: Normal in size without focal abnormality. Adrenals/Urinary Tract:  The adrenal glands appear stable without suspicious findings. Diffuse renovascular calcifications are present bilaterally. There are nonobstructing calculi in the lower pole of the left kidney measuring up to 7 mm in diameter. No evidence of ureteral calculus or hydronephrosis. The bladder appears unremarkable for its degree of distention. Stomach/Bowel: No enteric contrast administered. There are postsurgical changes from previous gastric bypass procedure. No small bowel distension, wall thickening or surrounding inflammation. There is mild nonspecific wall thickening in the sigmoid colon associated with underlying diverticulosis. No definite acute surrounding inflammation. No evidence of bowel obstruction. Vascular/Lymphatic: Multiple enlarged lymph nodes are present within the porta hepatis and upper retroperitoneum consistent with metastatic disease. There is a portacaval node measuring 5.0 x 2.5 cm  on image 31/2. Diffuse aortic and branch vessel atherosclerosis without evidence of aneurysm. Reproductive: Calcified uterine fibroid on the right. No suspicious adnexal findings. Other: Increased ascites compared with CT done earlier this month, primarily in the pelvis and around the liver. This appears similar in volume to more recent PET-CT. No focal extraluminal fluid collection or pneumoperitoneum. Musculoskeletal: No acute osseous findings. No focal lytic or blastic lesions are identified. There are degenerative changes in the spine, greatest at L4-5. IMPRESSION: 1. No definite acute findings or explanation for GI bleeding on noncontrast CT. 2. Widespread hepatic metastatic disease has progressed from previous CTS. Upper abdominal lymphadenopathy consistent with metastatic disease. See recent PET-CT. 3. Increased ascites compared with CT done earlier this month, primarily in the pelvis and around the liver. This appears similar in volume to more recent PET-CT. 4. Sigmoid diverticulosis with mild nonspecific wall thickening. No evidence of bowel obstruction or perforation. 5. Nonobstructing left renal calculi. 6. Cholelithiasis with chronic gallbladder wall thickening. 7.  Aortic Atherosclerosis (ICD10-I70.0). Electronically Signed   By: Richardean Sale M.D.   On: 04/21/2022 11:50   CT CHEST ABDOMEN PELVIS W CONTRAST  Result Date: 03/25/2022 CLINICAL DATA:  72 year old female with history of stage IV breast cancer. Evaluate response to treatment. * Tracking Code: BO * EXAM: CT CHEST, ABDOMEN, AND PELVIS WITH CONTRAST TECHNIQUE: Multidetector CT imaging of the chest, abdomen and pelvis was performed following the standard protocol during bolus administration of intravenous contrast. RADIATION DOSE REDUCTION: This exam was performed according to the departmental dose-optimization program which includes automated exposure control, adjustment of the mA and/or kV according to patient size and/or use of iterative  reconstruction technique. CONTRAST:  82mL OMNIPAQUE IOHEXOL 300 MG/ML  SOLN COMPARISON:  CT of the chest, abdomen and pelvis 01/17/2022. FINDINGS: CT CHEST FINDINGS Cardiovascular: Heart size is normal. There is no significant pericardial fluid, thickening or pericardial calcification. There is aortic atherosclerosis, as well as atherosclerosis of the great vessels of the mediastinum and the coronary arteries, including calcified atherosclerotic plaque in the left main, left anterior descending, left circumflex and right coronary arteries. Severe calcifications of the mitral annulus. Left-sided single-lumen subclavian Port-A-Cath with tip terminating in the distal superior vena cava. Mediastinum/Nodes: Interval enlargement of an anterior mediastinal lymph node just above the diaphragm (axial image 40 of series 2) which currently measures 11 mm (previously only 7 mm), suspicious for metastatic lymph node. No other pathologically enlarged mediastinal, hilar or internal mammary lymph nodes are identified at this time. Esophagus is unremarkable in appearance. No axillary lymphadenopathy. Surgical clips in the right axilla from prior lymph node dissection. Lungs/Pleura: Interval development of numerous pulmonary nodules, concerning for pulmonary metastases in the lungs bilaterally. Specific examples include a 9 x 6 mm left lower lobe pulmonary nodule (  axial image 124 of series 4), a 8 x 5 mm left lower lobe pulmonary nodule (axial image 106 of series 4), a 1.2 x 0.4 cm right lower lobe pulmonary nodule (axial image 77 of series 4), and a 7 x 5 mm right upper lobe pulmonary nodule (axial image 47 of series 4), although there are numerous other pulmonary nodules noted elsewhere in the lungs. No acute consolidative airspace disease. No pleural effusions. Musculoskeletal: Status post right modified radical mastectomy and right axillary lymph node dissection. Multiple healing fractures are noted of the anterior left third,  fourth, fifth and sixth ribs, new compared to the prior study, but nonacute. There are no aggressive appearing lytic or blastic lesions noted in the visualized portions of the skeleton. CT ABDOMEN PELVIS FINDINGS Hepatobiliary: Again noted are innumerable poorly defined hypovascular areas in the liver indicative of widespread metastatic disease to the liver. The largest conglomeration of lesions centered in the right lobe of the liver is very heterogeneous in appearance on today's examination, currently measuring 14.1 x 6.2 x 15.9 cm (axial image 48 of series 2 and coronal image 80 of series 5). The anterior portion of this conglomeration is more intermediate to high attenuation than previously seen, while the inferior portion of this conglomeration is more well-defined and low-attenuation when compared to the prior study. Overall, the burden of metastatic disease throughout the liver appears slightly increased, with increased number of small lesions, particularly evident in the left lobe of the liver and the superior aspect of the right lobe of the liver. Numerous calcified gallstones lie dependently in the gallbladder. No pericholecystic fluid or surrounding inflammatory changes. Pancreas: No pancreatic mass. No pancreatic ductal dilatation. No pancreatic or peripancreatic fluid collections or inflammatory changes. Spleen: Unremarkable. Adrenals/Urinary Tract: 8 mm nonobstructive calculus in the lower pole collecting system of the left kidney. Bilateral kidneys and right adrenal gland are otherwise normal in appearance. Nodular thickening of the left adrenal gland again noted measuring up to 2.4 x 1.3 cm (axial image 55 of series 2), previously 1.8 x 1.3 cm). No hydroureteronephrosis. Urinary bladder is unremarkable in appearance. Stomach/Bowel: Postoperative changes of Roux-en-Y gastric bypass. No pathologic dilatation of small bowel or colon. A few scattered colonic diverticuli are noted, without definite focal  surrounding inflammatory changes to indicate an acute diverticulitis at this time. The appendix is not confidently identified and may be surgically absent. Regardless, there are no inflammatory changes noted adjacent to the cecum to suggest the presence of an acute appendicitis at this time. Vascular/Lymphatic: Aortic atherosclerosis, without evidence of aneurysm or dissection in the abdominal or pelvic vasculature. Increasing upper abdominal and retroperitoneal lymphadenopathy indicative of widespread metastatic disease. Specific examples include an enlarged retrocaval lymph node (axial image 68 of series 2) measuring 2.2 cm in short axis (previously 1.7 cm), and an enlarged left para-aortic lymph node adjacent to the left renal hilum (axial image 68 of series 2) measuring 1.9 cm in short axis (previously only 1.3 cm). Portacaval nodal mass measuring 3.3 x 5.4 cm (previously only 2.3 x 4.5 cm). Hepatoduodenal ligament lymph node is similar measuring 3.3 cm in short axis (previously 2.9 cm). Reproductive: Uterus and ovaries are atrophic. Other: Trace volume of ascites.  No pneumoperitoneum. Musculoskeletal: There are no aggressive appearing lytic or blastic lesions noted in the visualized portions of the skeleton. IMPRESSION: 1. Further progression of metastatic disease, as detailed above, with increased number and size of metastatic lesions throughout the liver, progressive upper abdominal and retroperitoneal lymphadenopathy, developing anterior  mediastinal lymphadenopathy, and numerous new pulmonary nodules. 2. Small volume of ascites. 3. Cholelithiasis without evidence of acute cholecystitis. 4. Colonic diverticulosis without evidence of acute diverticulitis at this time. 5. Aortic atherosclerosis, in addition to left main and three-vessel coronary artery disease. 6. There are calcifications of the mitral annulus. Echocardiographic correlation for evaluation of potential valvular dysfunction may be warranted if  clinically indicated. 7. Additional incidental findings, as above. Electronically Signed   By: Vinnie Langton M.D.   On: 03/25/2022 08:03    PERFORMANCE STATUS (ECOG) : {CHL ONC ECOG [id number]  Review of Systems Unless otherwise noted, a complete review of systems is negative.  Physical Exam General: NAD Cardiovascular: regular rate and rhythm Pulmonary: clear ant fields Abdomen: soft, nontender, + bowel sounds Extremities: no edema, no joint deformities Skin: no rashes Neurological:  IMPRESSION: *** I introduced myself, Mariaguadalupe Fialkowski RN, and Palliative's role in collaboration with the oncology team. Concept of Palliative Care was introduced as specialized medical care for people and their families living with serious illness.  It focuses on providing relief from the symptoms and stress of a serious illness.  The goal is to improve quality of life for both the patient and the family. Values and goals of care important to patient and family were attempted to be elicited.    We discussed *** current illness and what it means in the larger context of *** on-going co-morbidities. Natural disease trajectory and expectations were discussed.  I discussed the importance of continued conversation with family and their medical providers regarding overall plan of care and treatment options, ensuring decisions are within the context of the patients values and GOCs.  PLAN: Established therapeutic relationship. Education provided on palliative's role in collaboration with their Oncology/Radiation team. I will plan to see patient back in 2-4 weeks in collaboration to other oncology appointments.    Patient expressed understanding and was in agreement with this plan. She also understands that She can call the clinic at any time with any questions, concerns, or complaints.   Thank you for your referral and allowing Palliative to assist in Mr./Mrs. Juanetta Snow Rybolt's care.   Number and  complexity of problems addressed: ***HIGH - 1 or more chronic illnesses with SEVERE exacerbation, progression, or side effects of treatment - advanced cancer, pain. Any controlled substances utilized were prescribed in the context of palliative care.   Visit consisted of counseling and education dealing with the complex and emotionally intense issues of symptom management and palliative care in the setting of serious and potentially life-threatening illness.Greater than 50%  of this time was spent counseling and coordinating care related to the above assessment and plan.  Signed by: Alda Lea, AGPCNP-BC Palliative Medicine Team/[place]   *Please note that this is a verbal dictation therefore any spelling or grammatical errors are due to the "[employer]" system interpretation.

## 2022-04-23 ENCOUNTER — Encounter (HOSPITAL_COMMUNITY): Payer: Self-pay | Admitting: Internal Medicine

## 2022-04-23 ENCOUNTER — Ambulatory Visit (INDEPENDENT_AMBULATORY_CARE_PROVIDER_SITE_OTHER): Payer: Medicare PPO | Admitting: Family Medicine

## 2022-04-23 ENCOUNTER — Encounter (INDEPENDENT_AMBULATORY_CARE_PROVIDER_SITE_OTHER): Payer: Self-pay | Admitting: Family Medicine

## 2022-04-23 ENCOUNTER — Encounter (INDEPENDENT_AMBULATORY_CARE_PROVIDER_SITE_OTHER): Payer: Self-pay

## 2022-04-23 DIAGNOSIS — E1169 Type 2 diabetes mellitus with other specified complication: Secondary | ICD-10-CM | POA: Diagnosis not present

## 2022-04-23 DIAGNOSIS — D62 Acute posthemorrhagic anemia: Secondary | ICD-10-CM | POA: Diagnosis not present

## 2022-04-23 DIAGNOSIS — K922 Gastrointestinal hemorrhage, unspecified: Secondary | ICD-10-CM | POA: Diagnosis not present

## 2022-04-23 DIAGNOSIS — C787 Secondary malignant neoplasm of liver and intrahepatic bile duct: Secondary | ICD-10-CM | POA: Diagnosis not present

## 2022-04-23 LAB — CBC WITH DIFFERENTIAL/PLATELET
Abs Immature Granulocytes: 3.09 10*3/uL — ABNORMAL HIGH (ref 0.00–0.07)
Basophils Absolute: 0 10*3/uL (ref 0.0–0.1)
Basophils Relative: 0 %
Eosinophils Absolute: 0 10*3/uL (ref 0.0–0.5)
Eosinophils Relative: 0 %
HCT: 27.7 % — ABNORMAL LOW (ref 36.0–46.0)
Hemoglobin: 8.9 g/dL — ABNORMAL LOW (ref 12.0–15.0)
Immature Granulocytes: 8 %
Lymphocytes Relative: 3 %
Lymphs Abs: 1.1 10*3/uL (ref 0.7–4.0)
MCH: 32 pg (ref 26.0–34.0)
MCHC: 32.1 g/dL (ref 30.0–36.0)
MCV: 99.6 fL (ref 80.0–100.0)
Monocytes Absolute: 3 10*3/uL — ABNORMAL HIGH (ref 0.1–1.0)
Monocytes Relative: 8 %
Neutro Abs: 29.6 10*3/uL — ABNORMAL HIGH (ref 1.7–7.7)
Neutrophils Relative %: 81 %
Platelets: 334 10*3/uL (ref 150–400)
RBC: 2.78 MIL/uL — ABNORMAL LOW (ref 3.87–5.11)
RDW: 17.3 % — ABNORMAL HIGH (ref 11.5–15.5)
WBC: 36.8 10*3/uL — ABNORMAL HIGH (ref 4.0–10.5)
nRBC: 0.4 % — ABNORMAL HIGH (ref 0.0–0.2)

## 2022-04-23 LAB — BASIC METABOLIC PANEL
Anion gap: 7 (ref 5–15)
BUN: 44 mg/dL — ABNORMAL HIGH (ref 8–23)
CO2: 19 mmol/L — ABNORMAL LOW (ref 22–32)
Calcium: 8.2 mg/dL — ABNORMAL LOW (ref 8.9–10.3)
Chloride: 111 mmol/L (ref 98–111)
Creatinine, Ser: 1.77 mg/dL — ABNORMAL HIGH (ref 0.44–1.00)
GFR, Estimated: 31 mL/min — ABNORMAL LOW (ref >60)
Glucose, Bld: 138 mg/dL — ABNORMAL HIGH (ref 70–99)
Potassium: 4.3 mmol/L (ref 3.5–5.1)
Sodium: 137 mmol/L (ref 135–145)

## 2022-04-23 LAB — GLUCOSE, CAPILLARY
Glucose-Capillary: 122 mg/dL — ABNORMAL HIGH (ref 70–99)
Glucose-Capillary: 169 mg/dL — ABNORMAL HIGH (ref 70–99)
Glucose-Capillary: 148 mg/dL — ABNORMAL HIGH (ref 70–99)
Glucose-Capillary: 120 mg/dL — ABNORMAL HIGH (ref 70–99)

## 2022-04-23 LAB — HEMOGLOBIN A1C
Hgb A1c MFr Bld: 7 % — ABNORMAL HIGH (ref 4.8–5.6)
Mean Plasma Glucose: 154 mg/dL

## 2022-04-23 MED ORDER — OMEPRAZOLE 20 MG PO CPDR
40.0000 mg | DELAYED_RELEASE_CAPSULE | Freq: Two times a day (BID) | ORAL | Status: DC
  Administered 2022-04-23 – 2022-04-24 (×2): 40 mg via ORAL
  Filled 2022-04-23 (×2): qty 2

## 2022-04-23 MED ORDER — VORTIOXETINE HBR 20 MG PO TABS
20.0000 mg | ORAL_TABLET | Freq: Every day | ORAL | Status: DC
  Administered 2022-04-23: 20 mg via ORAL
  Filled 2022-04-23 (×2): qty 1

## 2022-04-23 MED ORDER — SODIUM BICARBONATE 650 MG PO TABS
650.0000 mg | ORAL_TABLET | Freq: Two times a day (BID) | ORAL | Status: DC
  Administered 2022-04-23 – 2022-04-24 (×3): 650 mg via ORAL
  Filled 2022-04-23 (×3): qty 1

## 2022-04-23 NOTE — Progress Notes (Signed)
Patient assisted out of bed to ambulate. Patient weak and unsteady when oob even with first using her cane and then the Walker. Patient only at this time to take a few steps in the room. MD updated and PT/OT order placed.

## 2022-04-23 NOTE — Progress Notes (Signed)
PROGRESS NOTE    Darlene [name]  [id number] DOB: [birth year] DOA: 04/21/2022 PCP: Isaac Bliss, Rayford Halsted, MD    Chief Complaint  Patient presents with   GI Bleeding    Brief Narrative:  72 year old female history of metastatic breast cancer undergoing therapy, CKD, CHF, depression, type 2 diabetes, history of Roux-en-Y bypass presented to the ED for GI bleed.  Patient seen in consultation by gastroenterology, underwent upper endoscopy which revealed 2 ulcers in the proximity of the anastomotic line (1 bypass.  Clotting gel placed and bleeding ceased.  Patient transfused 2 units packed red blood cells.   Assessment & Plan:   Principal Problem:   GI bleed Active Problems:   Bariatric surgery status   Chemotherapy-induced peripheral neuropathy   Recurrent breast cancer, right   Diabetic retinopathy of both eyes associated with type 2 diabetes mellitus   Essential hypertension   Diabetes mellitus   Metastatic cancer to liver   Upper GI bleed   Acute blood loss anemia (ABLA)   AKI (acute kidney injury)   Leukocytosis   Acute anastomotic ulcer of gastrojejunal region  #1 upper GI bleed -Patient presented with GI bleed, seen by gastroenterology, underwent upper endoscopy that showed ulcer at the anastomosis of Roux-en-Y. -Felt likely secondary to steroids patient was recently placed on which has subsequently been discontinued. -Hemoglobin currently at 8.9 from 6.4 on admission, status post transfusion 2 units packed red blood cells. -Patient denies any further overt bleeding. -Patient was on octreotide and has been discontinued. -Currently on Protonix drip and will transition to omeprazole capsules 40 mg twice daily as recommended by GI. -Was on clear liquids, diet advanced to a soft diet which patient is tolerating. -GI recommending once patient on oral PPI will need to discharge on omeprazole 40 mg of lansoprazole 30 mg capsule form and open capsule and administer  granules with applesauce to enhance absorption given patient's gastric bypass history.  GI recommended PPI twice daily for at least 8 weeks.  2.  Acute blood loss anemia -Secondary to problem #1. -Status post transfusion 2 units packed red blood cells hemoglobin currently at 8.9 from 6.4 on admission. -Follow H&H. -Transfusion threshold hemoglobin < 7.  3.  Hypertension -BP currently stable. -Continue to hold oral antihypertensive medications.  4.  Hyperkalemia -Potassium noted at 5.6 the morning of 04/22/2022, repeat was 5.4, status post Lokelma x 2 doses with potassium currently at 4.3 this morning.    5.  AKI on CKD stage IIIb -Baseline creatinine approximately 1.5-2. -Likely secondary to prerenal azotemia secondary to recent GI bleed. -Status post transfusion 2 units packed red blood cells. -Renal function slowly improving posttransfusion now with hydration. -Saline lock IV fluids. -Repeat labs in the AM.  6.  Leukocytosis -Likely secondary to G-CSF. -Patient with no signs or symptoms of infection. -Patient afebrile. -Outpatient follow-up with hematology/oncology.  7.  Recurrent right breast cancer with metastases to the liver -Outpatient follow-up with oncology.  8.  Diabetes mellitus type 2 -Hemoglobin A1c 7.4 (12/19/2021) -Repeat hemoglobin A1c 7.0 (04/22/2022). -CBG 122 this morning.   -Continue current dose of Semglee, SSI.    9.  Debility -Patient was to be discharged however per RN patient is significantly unsteady on her feet even with a cane and walker and patient felt uncomfortable/unsafe to be discharged home. -PT OT eval ordered. -Due to concerns for safety patient will be kept in-house until evaluated by PT/OT.    DVT prophylaxis: SCDs Code Status: Full Family Communication: Updated patient,  husband at bedside. Disposition: Likely home with home health versus SNF once patient has been evaluated by PT.  Hopefully tomorrow.    Status is: Inpatient Remains  inpatient appropriate because: Severity of illness   Consultants:  Gastroenterology: Dr. Hilarie Fredrickson 04/21/2022  Procedures:  CT abdomen and pelvis 04/21/2022 Upper endoscopy 04/21/2022--per Dr. Hilarie Fredrickson Transfusion 2 units PRBC 04/21/2022  Antimicrobials:  None   Subjective: Patient sitting up in bed.  Feels significantly better than she did on admission.  Denies any further bleeding.  No abdominal pain.  No chest pain.  No shortness of breath.  Husband at bedside.    Objective: Vitals:   04/22/22 1952 04/22/22 2128 04/23/22 0506 04/23/22 1329  BP: 129/63 129/64 127/79 (!) 125/57  Pulse: 65  89 82  Resp: 17 18 16 18   Temp: 98.3 F (36.8 C)  (!) 97.5 F (36.4 C) 98 F (36.7 C)  TempSrc: Oral  Oral Oral  SpO2: 99%  97% 97%  Weight:      Height:        Intake/Output Summary (Last 24 hours) at 04/23/2022 1656 Last data filed at 04/23/2022 1632 Gross per 24 hour  Intake 1851.51 ml  Output 1925 ml  Net -73.49 ml    Filed Weights   04/21/22 1515  Weight: 79 kg    Examination:  General exam: NAD. Respiratory system: Lungs clear to auscultation bilaterally.  No wheezes, no crackles, no rhonchi.  Fair air movement.  Speaking in full sentences.   Cardiovascular system: RRR no murmurs rubs or gallops.  No JVD.  No lower extremity edema.  Gastrointestinal system: Abdomen is soft, nontender, nondistended, positive bowel sounds.  No rebound.  No guarding.  Central nervous system: Alert and oriented. No focal neurological deficits. Extremities: Symmetric 5 x 5 power. Skin: No rashes, lesions or ulcers Psychiatry: Judgement and insight appear normal. Mood & affect appropriate.     Data Reviewed: I have personally reviewed following labs and imaging studies  CBC: Recent Labs  Lab 04/21/22 1827 04/22/22 0020 04/22/22 0837 04/22/22 1500 04/23/22 0401  WBC 36.4* 38.9* 40.3* 39.5* 36.8*  NEUTROABS  --   --   --  31.9* 29.6*  HGB 9.1*  9.0* 8.5*  8.5* 8.8* 8.2* 8.9*  HCT 27.9*   27.8* 26.0*  25.7* 27.1* 25.6* 27.7*  MCV 96.5 96.3 97.5 99.2 99.6  PLT 274 267 310 312 334     Basic Metabolic Panel: Recent Labs  Lab 04/21/22 0919 04/22/22 0020 04/22/22 0837 04/23/22 0401  NA 135 135  --  137  K 4.7 5.6* 5.4* 4.3  CL 105 107  --  111  CO2 19* 19*  --  19*  GLUCOSE 206* 143*  --  138*  BUN 62* 56*  --  44*  CREATININE 2.20* 2.13*  --  1.77*  CALCIUM 8.7* 8.2*  --  8.2*     GFR: Estimated Creatinine Clearance: 31.3 mL/min (A) (by C-G formula based on SCr of 1.77 mg/dL (H)).  Liver Function Tests: Recent Labs  Lab 04/21/22 0919  AST 59*  ALT 22  ALKPHOS 214*  BILITOT 0.7  PROT 5.0*  ALBUMIN 2.7*     CBG: Recent Labs  Lab 04/22/22 1408 04/22/22 1631 04/22/22 1954 04/23/22 0734 04/23/22 1134  GLUCAP 264* 169* 186* 122* 169*      No results found for this or any previous visit (from the past 240 hour(s)).       Radiology Studies: No results found.  Scheduled Meds:  Bempedoic Acid-Ezetimibe  1 tablet Oral Daily   buPROPion  200 mg Oral Daily   carvedilol  6.25 mg Oral BID   Chlorhexidine Gluconate Cloth  6 each Topical Daily   cholecalciferol  1,000 Units Oral Daily   docusate sodium  100 mg Oral BID   insulin aspart  0-9 Units Subcutaneous TID WC   insulin glargine-yfgn  5 Units Subcutaneous Daily   loratadine  10 mg Oral Daily   melatonin  5 mg Oral QHS   sodium bicarbonate  650 mg Oral BID   sodium chloride flush  10-40 mL Intracatheter Q12H   vortioxetine HBr  20 mg Oral QHS   Continuous Infusions:  sodium chloride 50 mL/hr at 04/23/22 0918   pantoprazole 8 mg/hr (04/23/22 1143)     LOS: 2 days    Time spent: 35 minutes    Irine Seal, MD [facility]   To contact the attending provider between 7A-7P or the covering provider during after hours 7P-7A, please log into the web site [email or web address] and access using universal [place] password for that web site. If you do not have the  password, please call the hospital operator.  04/23/2022, 4:56 PM

## 2022-04-24 DIAGNOSIS — D62 Acute posthemorrhagic anemia: Secondary | ICD-10-CM | POA: Diagnosis not present

## 2022-04-24 DIAGNOSIS — K922 Gastrointestinal hemorrhage, unspecified: Secondary | ICD-10-CM | POA: Diagnosis not present

## 2022-04-24 LAB — CBC WITH DIFFERENTIAL/PLATELET
Abs Immature Granulocytes: 3.04 10*3/uL — ABNORMAL HIGH (ref 0.00–0.07)
Basophils Absolute: 0.1 10*3/uL (ref 0.0–0.1)
Basophils Relative: 0 %
Eosinophils Absolute: 0.1 10*3/uL (ref 0.0–0.5)
Eosinophils Relative: 0 %
HCT: 28.8 % — ABNORMAL LOW (ref 36.0–46.0)
Hemoglobin: 9.2 g/dL — ABNORMAL LOW (ref 12.0–15.0)
Immature Granulocytes: 8 %
Lymphocytes Relative: 3 %
Lymphs Abs: 1.1 10*3/uL (ref 0.7–4.0)
MCH: 31.7 pg (ref 26.0–34.0)
MCHC: 31.9 g/dL (ref 30.0–36.0)
MCV: 99.3 fL (ref 80.0–100.0)
Monocytes Absolute: 3.7 10*3/uL — ABNORMAL HIGH (ref 0.1–1.0)
Monocytes Relative: 10 %
Neutro Abs: 30.6 10*3/uL — ABNORMAL HIGH (ref 1.7–7.7)
Neutrophils Relative %: 79 %
Platelets: 364 10*3/uL (ref 150–400)
RBC: 2.9 MIL/uL — ABNORMAL LOW (ref 3.87–5.11)
RDW: 18 % — ABNORMAL HIGH (ref 11.5–15.5)
WBC: 38.5 10*3/uL — ABNORMAL HIGH (ref 4.0–10.5)
nRBC: 0.1 % (ref 0.0–0.2)

## 2022-04-24 LAB — BASIC METABOLIC PANEL
Anion gap: 5 (ref 5–15)
BUN: 38 mg/dL — ABNORMAL HIGH (ref 8–23)
CO2: 21 mmol/L — ABNORMAL LOW (ref 22–32)
Calcium: 8.3 mg/dL — ABNORMAL LOW (ref 8.9–10.3)
Chloride: 110 mmol/L (ref 98–111)
Creatinine, Ser: 1.59 mg/dL — ABNORMAL HIGH (ref 0.44–1.00)
GFR, Estimated: 35 mL/min — ABNORMAL LOW (ref >60)
Glucose, Bld: 104 mg/dL — ABNORMAL HIGH (ref 70–99)
Potassium: 3.9 mmol/L (ref 3.5–5.1)
Sodium: 136 mmol/L (ref 135–145)

## 2022-04-24 LAB — MAGNESIUM: Magnesium: 1.7 mg/dL (ref 1.7–2.4)

## 2022-04-24 LAB — GLUCOSE, CAPILLARY
Glucose-Capillary: 98 mg/dL (ref 70–99)
Glucose-Capillary: 121 mg/dL — ABNORMAL HIGH (ref 70–99)

## 2022-04-24 MED ORDER — HEPARIN SOD (PORK) LOCK FLUSH 100 UNIT/ML IV SOLN
500.0000 [IU] | INTRAVENOUS | Status: AC | PRN
  Administered 2022-04-24: 500 [IU]

## 2022-04-24 MED ORDER — ASPIRIN EC 81 MG PO TBEC: 81.0000 mg | DELAYED_RELEASE_TABLET | Freq: Every day | ORAL | Status: DC

## 2022-04-24 MED ORDER — OMEPRAZOLE 40 MG PO CPDR: 40.0000 mg | DELAYED_RELEASE_CAPSULE | Freq: Two times a day (BID) | ORAL | 1 refills | Status: DC

## 2022-04-24 MED ORDER — LANTUS SOLOSTAR 100 UNIT/ML ~~LOC~~ SOPN
11.0000 [IU] | PEN_INJECTOR | Freq: Every day | SUBCUTANEOUS | Status: DC
Start: 2022-04-24 — End: 2022-05-09

## 2022-04-24 NOTE — Evaluation (Signed)
Physical Therapy Evaluation Patient Details Name: Darlene [name] MRN: [id number] DOB: 01-May-1950 Today's Date: 04/24/2022  History of Present Illness  Pt is a 72 y/o F admitted on 04/21/22 after presenting to the ED for GI bleed. Pt underwent upper endoscopy which revealed 2 ulcers in the proximity of the anastomotic line 1 bypass. Clotting gel placed &  bleeding ceased. Pt transfused 2 units packed red blood cells. PMH: metastatic breast CA undergoing therapy, CKD, CHF, depression, DM2, Rou-en-Y bypass  Clinical Impression  Pt seen for PT evaluation with pt agreeable. Pt notes prior to admission she has had 3 falls in the past 6 months & over the last few weeks pt required assistance from husband to transfer off of toilet & out of bed 2/2 progressive weakness. On this date, pt is able to complete STS from recliner with min assist & ambulate into hallway with RW. Pt presents with decreased heel strike BLE but also does not have BLE braces for foot drop donned. Pt would benefit from ongoing skilled PT tx to address balance, strength, & endurance to reduce fall risk & increase independence with mobility.   HR up to 134 bpm with gait   Recommendations for follow up therapy are one component of a multi-disciplinary discharge planning process, led by the attending physician.  Recommendations may be updated based on patient status, additional functional criteria and insurance authorization.  Follow Up Recommendations       Assistance Recommended at Discharge Intermittent Supervision/Assistance  Patient can return home with the following  A little help with walking and/or transfers;A little help with bathing/dressing/bathroom;Assistance with cooking/housework;Assist for transportation;Help with stairs or ramp for entrance    Equipment Recommendations None recommended by PT (pt already has RW)  Recommendations for Other Services       Functional Status Assessment Patient has had a recent  decline in their functional status and demonstrates the ability to make significant improvements in function in a reasonable and predictable amount of time.     Precautions / Restrictions Precautions Precautions: Fall Required Braces or Orthoses: Other Brace Other Brace: has B braces for foot drop Restrictions Weight Bearing Restrictions: No      Mobility  Bed Mobility               General bed mobility comments: not tested, pt received & left sitting in recliner    Transfers Overall transfer level: Needs assistance Equipment used: Rolling walker (2 wheels)   Sit to Stand: Min assist           General transfer comment: Cuing to scoot out to edge of seat & push up with BUE on armrests; pt requires extra time but is able to power to standing with min assist.    Ambulation/Gait Ambulation/Gait assistance: Supervision Gait Distance (Feet): 50 Feet Assistive device: Rolling walker (2 wheels) Gait Pattern/deviations: Decreased step length - left, Decreased step length - right, Decreased dorsiflexion - right, Decreased dorsiflexion - left, Decreased stride length Gait velocity: decrease     General Gait Details: Education/cuing to ambulate within base of AD especially during turns.  Stairs            Wheelchair Mobility    Modified Rankin (Stroke Patients Only)       Balance Overall balance assessment: Needs assistance Sitting-balance support: No upper extremity supported, Feet supported Sitting balance-Leahy Scale: Good     Standing balance support: During functional activity, Bilateral upper extremity supported Standing balance-Leahy Scale: Fair  Pertinent Vitals/Pain Pain Assessment Pain Assessment: No/denies pain    Home Living Family/patient expects to be discharged to:: Private residence Living Arrangements: Spouse/significant other Available Help at Discharge: Family;Available 24 hours/day Type of  Home:  (townhouse) Home Access: Stairs to enter Entrance Stairs-Rails: Left;Right;Can reach both Entrance Stairs-Number of Steps: 1 (onto stoop)   Home Layout: One level Home Equipment: Grab bars - tub/shower;Rolling Walker (2 wheels);Shower seat - built in;Hand held shower head;Other (comment);Cane - quad Additional Comments: says husband has had multiple back surgeries and cannot help lift a lot. mentioned showerhead too far from built in shower seat at home    Prior Function Prior Level of Function : Needs assist             Mobility Comments: Pt ambulatory with cane, notes 3 falls in the past 6 months (2 tripping on curb, 1 slip & fall in the bathroom). Over the past few weeks pt has needed assistance from spouse to get OOB & transfer off of toilet. ADLs Comments: typically able to bathe, dress and toilet self. does need occasional assist standing from lower surfaces (toilet, sits on multiple cushions on couch)     Hand Dominance   Dominant Hand: Right    Extremity/Trunk Assessment   Upper Extremity Assessment Upper Extremity Assessment: Generalized weakness    Lower Extremity Assessment Lower Extremity Assessment: Generalized weakness    Cervical / Trunk Assessment Cervical / Trunk Assessment: Normal  Communication   Communication: HOH  Cognition Arousal/Alertness: Awake/alert Behavior During Therapy: WFL for tasks assessed/performed Overall Cognitive Status: Within Functional Limits for tasks assessed                                          General Comments      Exercises Other Exercises Other Exercises: Educated pt & spouse on elevated sitting surface heights with pillows, removing tripping hazards/throw rugs.   Assessment/Plan    PT Assessment Patient needs continued PT services  PT Problem List Decreased strength;Decreased activity tolerance;Decreased balance;Decreased mobility;Decreased knowledge of use of DME       PT Treatment  Interventions DME instruction;Therapeutic exercise;Gait training;Balance training;Stair training;Neuromuscular re-education;Functional mobility training;Patient/family education;Therapeutic activities    PT Goals (Current goals can be found in the Care Plan section)  Acute Rehab PT Goals Patient Stated Goal: get stronger PT Goal Formulation: With patient Time For Goal Achievement: 05/08/22 Potential to Achieve Goals: Good    Frequency Min 3X/week     Co-evaluation               AM-PAC PT "6 Clicks" Mobility  Outcome Measure Help needed turning from your back to your side while in a flat bed without using bedrails?: None Help needed moving from lying on your back to sitting on the side of a flat bed without using bedrails?: A Little Help needed moving to and from a bed to a chair (including a wheelchair)?: A Little Help needed standing up from a chair using your arms (e.g., wheelchair or bedside chair)?: A Little Help needed to walk in hospital room?: A Little Help needed climbing 3-5 steps with a railing? : A Little 6 Click Score: 19    End of Session   Activity Tolerance: Patient tolerated treatment well;Patient limited by fatigue Patient left: in chair;with call bell/phone within reach;with family/visitor present Nurse Communication: Mobility status PT Visit Diagnosis: Muscle weakness (generalized) (M62.81);Difficulty in  walking, not elsewhere classified (R26.2)    Time: [id number] PT Time Calculation (min) (ACUTE ONLY): 21 min   Charges:   PT Evaluation $PT Eval Low Complexity: [place], PT, DPT 04/24/22, 9:20 AM   Waunita Schooner 04/24/2022, 9:18 AM

## 2022-04-24 NOTE — Evaluation (Signed)
Occupational Therapy Evaluation Patient Details Name: Darlene [name] MRN: [id number] DOB: 1950-03-24 Today's Date: 04/24/2022   History of Present Illness Pt is a 72 y/o female admitted for GI bleed. PMH: anemia, anxiety, breast CA, DM, CHF, CKF, foot drop, HTN,   Clinical Impression   PTA, pt lives with husband, reports typically Modified Independent with ADLs and mobility using cane though does require assist to stand from lower surfaces. Pt presents now initially unsteady with cane though improved to min guard with RW. Pt requires min guard to Mod A to stand from various height surfaces. Educated on use of BSC over toilet to maximize toileting independence at home. Pt requires no more than min guard for ADLs assessed. Recommend HHOT follow up at DC w/ pt agreeable for these services.       Recommendations for follow up therapy are one component of a multi-disciplinary discharge planning process, led by the attending physician.  Recommendations may be updated based on patient status, additional functional criteria and insurance authorization.   Assistance Recommended at Discharge Intermittent Supervision/Assistance  Patient can return home with the following A little help with walking and/or transfers;A little help with bathing/dressing/bathroom;Assistance with cooking/housework    Functional Status Assessment  Patient has had a recent decline in their functional status and demonstrates the ability to make significant improvements in function in a reasonable and predictable amount of time.  Equipment Recommendations  BSC/3in1;Other (comment) (sliders for RW (plan to obtain at discharge))    Recommendations for Other Services       Precautions / Restrictions Precautions Precautions: Fall Required Braces or Orthoses: Other Brace Other Brace: has B braces for foot drop Restrictions Weight Bearing Restrictions: No      Mobility Bed Mobility Overal bed mobility: Modified  Independent                  Transfers Overall transfer level: Needs assistance Equipment used: None, Rolling walker (2 wheels) Transfers: Sit to/from Stand Sit to Stand: Min guard, Min assist, Mod assist           General transfer comment: Min A to stand from elevated bed (simulated to home height) without device. min guard from toilet with grab bars. Up to [place] from low recliner in room (placed pillow in seat afterwards to increase ease of transfer)      Balance Overall balance assessment: Needs assistance Sitting-balance support: No upper extremity supported, Feet supported Sitting balance-Leahy Scale: Good     Standing balance support: No upper extremity supported, During functional activity, Bilateral upper extremity supported, Single extremity supported Standing balance-Leahy Scale: Fair                             ADL either performed or assessed with clinical judgement   ADL Overall ADL's : Needs assistance/impaired Eating/Feeding: Independent   Grooming: Supervision/safety;Standing;Wash/dry face Grooming Details (indicate cue type and reason): mild sway standing without assist at sink but no overt LOB. pt aware of balance deficits Upper Body Bathing: Set up;Sitting   Lower Body Bathing: Min guard;Sit to/from stand   Upper Body Dressing : Set up;Sitting   Lower Body Dressing: Min guard Lower Body Dressing Details (indicate cue type and reason): able to don depends Toilet Transfer: Min guard;Ambulation Toilet Transfer Details (indicate cue type and reason): trialed cane w/ pt also reaching to IV pole. Used RW on exit of bathroom with improved stability noted Toileting- Water quality scientist and Hygiene: Min  guard;Sitting/lateral lean;Sit to/from stand       Functional mobility during ADLs: Min guard;Rolling walker (2 wheels) General ADL Comments: Educated re: use of BSC over toilet to ease transfers as pt does not have grab bars at home,  discussed alterations of showerhead at home, use of sliders on RW to increase ease of using walker on carpet at home.     Vision Baseline Vision/History: 1 Wears glasses Ability to See in Adequate Light: 0 Adequate Patient Visual Report: No change from baseline Vision Assessment?: No apparent visual deficits     Perception     Praxis      Pertinent Vitals/Pain Pain Assessment Pain Assessment: No/denies pain     Hand Dominance Right   Extremity/Trunk Assessment Upper Extremity Assessment Upper Extremity Assessment: Overall WFL for tasks assessed   Lower Extremity Assessment Lower Extremity Assessment: Defer to PT evaluation   Cervical / Trunk Assessment Cervical / Trunk Assessment: Normal   Communication Communication Communication: No difficulties   Cognition Arousal/Alertness: Awake/alert Behavior During Therapy: WFL for tasks assessed/performed Overall Cognitive Status: Within Functional Limits for tasks assessed                                       General Comments       Exercises     Shoulder Instructions      Home Living Family/patient expects to be discharged to:: Private residence Living Arrangements: Spouse/significant other Available Help at Discharge: Family;Available 24 hours/day Type of Home: Other(Comment) (townhouse) Home Access: Stairs to enter [employer] of Steps: 1 stoop Entrance Stairs-Rails: Left;Right Home Layout: One level     Bathroom Shower/Tub: Occupational psychologist: Standard     Home Equipment: Grab bars - tub/shower;Rolling Environmental consultant (2 wheels);Cane - single point;Shower seat - built in;Hand held shower head;Other (comment) (gait belt)   Additional Comments: says husband has had multiple back surgeries and cannot help lift a lot. mentioned showerhead too far from built in shower seat at home      Prior Functioning/Environment Prior Level of Function : Needs assist              Mobility Comments: use of cane for mobility ADLs Comments: typically able to bathe, dress and toilet self. does need occasional assist standing from lower surfaces (toilet, sits on multiple cushions on couch)        OT Problem List: Decreased strength;Decreased activity tolerance;Impaired balance (sitting and/or standing);Decreased knowledge of use of DME or AE      OT Treatment/Interventions: Self-care/ADL training;Therapeutic exercise;Energy conservation;DME and/or AE instruction;Therapeutic activities    OT Goals(Current goals can be found in the care plan section) Acute Rehab OT Goals Patient Stated Goal: home today OT Goal Formulation: With patient Time For Goal Achievement: 05/08/22 Potential to Achieve Goals: Good ADL Goals Pt Will Perform Lower Body Dressing: with modified independence;sit to/from stand Pt Will Transfer to Toilet: with modified independence;ambulating Pt/caregiver will Perform Home Exercise Program: Increased strength;Both right and left upper extremity;With theraband;Independently;With written HEP provided  OT Frequency: Min 2X/week    Co-evaluation              AM-PAC OT "6 Clicks" Daily Activity     Outcome Measure Help from another person eating meals?: None Help from another person taking care of personal grooming?: A Little Help from another person toileting, which includes using toliet, bedpan, or urinal?: A Little  Help from another person bathing (including washing, rinsing, drying)?: A Little Help from another person to put on and taking off regular upper body clothing?: A Little Help from another person to put on and taking off regular lower body clothing?: A Little 6 Click Score: 19   End of Session Equipment Utilized During Treatment: Gait belt;Rolling walker (2 wheels) Nurse Communication: Mobility status  Activity Tolerance: Patient tolerated treatment well Patient left: in chair;with call bell/phone within reach;with chair alarm  set  OT Visit Diagnosis: Unsteadiness on feet (R26.81);Other abnormalities of gait and mobility (R26.89);Muscle weakness (generalized) (M62.81)                Time: 0722-0753 OT Time Calculation (min): 31 min Charges:  OT General Charges $OT Visit: 1 Visit OT Evaluation $OT Eval Moderate Complexity: 1 Mod OT Treatments $Self Care/Home Management : 8-22 mins  Malachy Chamber, OTR/L Acute Rehab Services Office: [phone]   Layla Maw 04/24/2022, 8:08 AM

## 2022-04-24 NOTE — Progress Notes (Signed)
Patient discharging at this time.  Discharge instructions via AVS reviewed with patient and spouse. All questions answered and both verbalized understanding.  Cardiac monitor removed and returned to nurses station.  Patient escorted off unit via wheelchair with all personal belongings.  Patient left campus via private vehicle driven by spouse.

## 2022-04-24 NOTE — Discharge Instructions (Signed)

## 2022-04-24 NOTE — Discharge Summary (Signed)
Physician Discharge Summary  Darlene [name] [id number] DOB: 22-Mar-1950  PCP: Isaac Bliss, Rayford Halsted, MD  Admitted from: Home Discharged to: Home  Admit date: 04/21/2022 Discharge date: 04/24/2022  Recommendations for Outpatient Follow-up:    Follow-up Information     Isaac Bliss, Rayford Halsted, MD. Schedule an appointment as soon as possible for a visit in 1 week(s).   Specialty: Internal Medicine Why: To be seen with repeat labs (CBC & BMP). Contact information: [place] [address] [phone]         Nicholas Lose, MD. Schedule an appointment as soon as possible for a visit.   Specialty: Hematology and Oncology Why: Missed appointment on 04/22/2022 due to hospitalization.  Call for an appointment. Contact information: [place] [id number] [id number]         Thornton Park, MD. Schedule an appointment as soon as possible for a visit in 1 month(s).   Specialty: Gastroenterology Contact information: [place] [address] [phone]         Care, [facility] Follow up.   Specialty: Home Health Services Why: Alvis Lemmings will follow you at home for Home Health. Contact information: [place] [address] [phone]                  Home Health: Home Health Orders (From admission, onward)     Start     Ordered   04/24/22 [place]  At discharge       Question Answer Comment  To provide the following care/treatments PT   To provide the following care/treatments OT      04/24/22 0957             Equipment/Devices:     Durable Medical Equipment  (From admission, onward)           Start     Ordered   04/24/22 0956  DME 3-in-1  Once        04/24/22 0957             Discharge Condition: Improved and stable   Code Status: Full Code ACP documents: Reviewed healthcare power of attorney and living  well. Diet recommendation:  Discharge Diet Orders (From admission, onward)     Start     Ordered   04/24/22 0000  Diet - low sodium heart healthy        04/24/22 0957   04/24/22 0000  Diet Carb Modified        04/24/22 0957             Discharge Diagnoses:  Principal Problem:   GI bleed Active Problems:   Bariatric surgery status   Chemotherapy-induced peripheral neuropathy   Recurrent breast cancer, right   Diabetic retinopathy of both eyes associated with type 2 diabetes mellitus   Essential hypertension   Diabetes mellitus   Metastatic cancer to liver   Upper GI bleed   Acute blood loss anemia (ABLA)   AKI (acute kidney injury)   Leukocytosis   Acute anastomotic ulcer of gastrojejunal region   Brief Summary: 72 year old female history of metastatic breast cancer undergoing therapy, CKD, CHF, depression, type 2 diabetes, history of Roux-en-Y bypass presented to the ED with complaints of vomiting blood clot on the morning of admission followed by several dark BMs.  She was admitted for for GI bleed.  Patient seen in consultation by [facility] gastroenterology, underwent upper endoscopy which revealed  2 ulcers in the proximity of the anastomotic line.  Clotting gel placed and bleeding ceased.  Patient transfused 2 units packed red blood cells.     Assessment & Plan:   #1  Acute upper GI bleed -Patient presented with GI bleed, seen by gastroenterology, underwent upper endoscopy that showed ulcer at the anastomosis of Roux-en-Y. -Felt likely secondary to steroids patient was recently placed on, which has subsequently been discontinued. -Hemoglobin currently at 9.2 from 6.4 on admission, status post transfusion 2 units packed red blood cells. -Patient was on octreotide and has been discontinued. Patient was transition from IV to oral PPI.  Diet was advanced which she has tolerated.  As per patient's report, no further vomiting since admission and melena clearing up with stools  returning to normal color. -GI recommending once patient on oral PPI will need to discharge on omeprazole 40 mg of lansoprazole 30 mg capsule form and open capsule and administer granules with applesauce to enhance absorption given patient's gastric bypass history.  GI recommended PPI twice daily for at least 8 weeks. -As per GI recommendations, will need repeat EGD in 2 to 3 months to ensure healing of anastomotic ulcers, outpatient follow-up with either Dr. Tarri Glenn or Dr. Hilarie Fredrickson in 1 month. -I personally discussed with Dr. Fuller Plan on day of discharge regarding aspirin.  He recommended holding aspirin for the next 5 days and then resuming it back.  As per chart review, from Dr. Aundra Dubin, cardiologist note and December 2023, is on aspirin 81 Mg daily for CAD. - GI Bleed has clinically resolved.   2.  Acute blood loss anemia -Secondary to problem #1. -Status post transfusion 2 units packed red blood cells hemoglobin currently at 9.2 from 6.4 on admission. Follow CBCs closely as outpatient.  Patient missed an appointment with her hematologist on 4/1 due to hospitalization.  She and her husband at bedside have been advised to call their office for a new appt.  They verbalized understanding.   3.  Hypertension -BP currently stable. -Continue prior home meds.   4.  Hyperkalemia -Potassium noted at 5.6 the morning of 04/22/2022, repeat was 5.4, status post Lokelma x 2 doses with potassium currently at 3.9 this morning.     5.  AKI on CKD stage IIIb -Baseline creatinine approximately 1.5-2. -Likely secondary to prerenal azotemia secondary to recent GI bleed. -Status post transfusion 2 units packed red blood cells. -Renal function slowly improving posttransfusion now with hydration. -Saline lock IV fluids. -Creatinine continues to improve and approaching prior baseline.  Counseled patient and spouse at bedside to avoid NSAIDs for renal complications as well as recurrent GI bleed and he verbalized  understanding.  Close outpatient follow-up of BMP with either her PCP or at the oncologist office in a week's time.   6.  Leukocytosis -Likely secondary to G-CSF. -Patient with no signs or symptoms of infection. -Patient afebrile. -Outpatient follow-up with hematology/oncology.   7.  Recurrent right breast cancer with metastases to the liver -Outpatient follow-up with oncology, Dr. Lindi Adie.   8.  Diabetes mellitus type 2 -Hemoglobin A1c 7.4 (12/19/2021) -Repeat hemoglobin A1c 7.0 (04/22/2022). -Reasonably controlled inpatient -Continue prior home dose dose of Semglee, SSI.     9.  Debility -Patient was to be discharged on 4/2, however per RN patient was significantly unsteady on her feet even with a cane and walker and patient felt uncomfortable/unsafe to be discharged home. -PT and OT evaluated this morning and recommend home health PT and OT along with DME  as noted above.  TOC consulted for same.     Consultants:  Gastroenterology: Dr. Hilarie Fredrickson 04/21/2022   Procedures:  CT abdomen and pelvis 04/21/2022 Upper endoscopy 04/21/2022--per Dr. Hilarie Fredrickson Transfusion 2 units PRBC 04/21/2022  Discharge Instructions  Discharge Instructions     Call MD for:   Complete by: As directed    Vomiting blood or black color/coffee-ground material.  Blood in stools or persistent or recurrent black tarry stools.   Call MD for:  difficulty breathing, headache or visual disturbances   Complete by: As directed    Call MD for:  extreme fatigue   Complete by: As directed    Call MD for:  persistant dizziness or light-headedness   Complete by: As directed    Call MD for:  persistant nausea and vomiting   Complete by: As directed    Call MD for:  severe uncontrolled pain   Complete by: As directed    Call MD for:  temperature >100.4   Complete by: As directed    Diet - low sodium heart healthy   Complete by: As directed    Diet Carb Modified   Complete by: As directed    Increase activity slowly    Complete by: As directed         Medication List     TAKE these medications    Accu-Chek Guide Control Liqd 1 Bottle by In Vitro route as needed.   Accu-Chek Guide test strip Generic drug: glucose blood Use Accu Chek test strips to check blood sugar 4 times daily. DX:E11.21   Accu-Chek Guide w/Device Kit 1 each by Does not apply route 4 (four) times daily. Use to test blood sugars 4x a day   Accu-Chek Softclix Lancets lancets 1 each by Other route 4 (four) times daily. Test blood sugars 4 x times daily   aspirin EC 81 MG tablet Take 1 tablet (81 mg total) by mouth daily. Start taking on: April 29, 2022 What changed: These instructions start on April 29, 2022. If you are unsure what to do until then, ask your doctor or other care provider.   buPROPion 200 MG 12 hr tablet Commonly known as: WELLBUTRIN SR Take one tablet by mouth once daily. What changed:  how much to take how to take this when to take this   CALCIUM CITRATE-VITAMIN D PO Take 1 tablet by mouth 3 (three) times daily.   carvedilol 6.25 MG tablet Commonly known as: COREG Take 1 tablet (6.25 mg total) by mouth 2 (two) times daily.   cholecalciferol 1000 units tablet Commonly known as: VITAMIN D Take 1,000 Units by mouth daily.   [employer] Acidophilus Probiotic 0.5 MG Tabs Take 1 tablet by mouth daily.   insulin aspart 100 UNIT/ML injection Commonly known as: novoLOG Inject into the skin daily as needed for high blood sugar. Per patient, sliding scale with meals as needed   Kerendia 20 MG Tabs Generic drug: Finerenone TAKE 1 TABLET EVERY DAY What changed: how much to take   Lantus SoloStar 100 UNIT/ML Solostar Pen Generic drug: insulin glargine Inject 11 Units into the skin daily.   loperamide 2 MG capsule Commonly known as: IMODIUM Take 2 mg by mouth as needed for diarrhea or loose stools.   loratadine 10 MG tablet Commonly known as: CLARITIN Take 10 mg by mouth daily.   melatonin 5 MG  Tabs Take 5 mg by mouth at bedtime.   Nexlizet 180-10 MG Tabs Generic drug: Bempedoic Acid-Ezetimibe Take 1 tablet  by mouth daily.   omeprazole 40 MG capsule Commonly known as: PRILOSEC Take 1 capsule (40 mg total) by mouth 2 (two) times daily before a meal. Open capsule, sprinkle granules in applesauce  & take two times daily for 8 weeks then consider reducing to once daily.   ondansetron 8 MG tablet Commonly known as: ZOFRAN Take 1 tablet (8 mg total) by mouth every 8 (eight) hours as needed for nausea or vomiting.   Opurity Tabs Take 1 tablet by mouth 2 (two) times daily.   polyethylene glycol 17 g packet Commonly known as: MIRALAX / GLYCOLAX Take 17 g by mouth daily as needed for moderate constipation.   prochlorperazine 10 MG tablet Commonly known as: COMPAZINE Take 1 tablet (10 mg total) by mouth every 6 (six) hours as needed (Nausea or vomiting).   Sure Comfort Pen Needles 32G X 4 MM Misc Generic drug: Insulin Pen Needle Use as instructed to inject insulin and Victoza daily.   SYSTANE BALANCE OP Place 1-2 drops into both eyes 3 (three) times daily as needed (for dry eyes).   tobramycin 0.3 % ophthalmic ointment Commonly known as: TOBREX Place 1 application into both eyes as needed (Takes day before eye injections).   VITAMIN B-12 PO Take 1 tablet by mouth every 14 (fourteen) days.   vortioxetine HBr 20 MG Tabs tablet Commonly known as: Trintellix Take 1 tablet (20 mg total) by mouth at bedtime.       Allergies  Allergen Reactions   Antihistamines, Diphenhydramine-Type Other (See Comments)    IV diphenhydramine causes sedation.   Nsaids Other (See Comments)    H/o gastric bypass - avoid NSAIDs   Peanut Allergen Powder-Dnfp Itching   Tape Hives, Rash and Other (See Comments)    Adhesives in bandaids      Procedures/Studies: NM PET Image Restage (PS) Skull Base to Thigh (F-18 FDG)  Result Date: 04/22/2022 CLINICAL DATA:  Subsequent treatment strategy  for breast cancer with liver metastases. EXAM: NUCLEAR MEDICINE PET SKULL BASE TO THIGH TECHNIQUE: 8.6 mCi F-18 FDG was injected intravenously. Full-ring PET imaging was performed from the skull base to thigh after the radiotracer. CT data was obtained and used for attenuation correction and anatomic localization. Fasting blood glucose: 99 mg/dl COMPARISON:  CT chest abdomen pelvis dated 03/23/2022 FINDINGS: Mediastinal blood pool activity: SUV max 1.5 Liver activity: SUV max NA NECK: No hypermetabolic cervical lymphadenopathy. Incidental CT findings: None. CHEST: Status post right mastectomy. Left supraclavicular nodal metastases measuring up to 14 mm short axis at the left thoracic inlet, max SUV 18.4. 10 mm short axis subcarinal nodal metastasis a max SUV 4.6. Associated hypermetabolism in the right hilar region, max SUV 3.7. When compared to recent CT chest, this appearance is unchanged. Evaluation lung parenchyma is constrained by respiratory motion. The patient's small bilateral pulmonary nodules noted on recent CT are not well visualized on the current study. No associated hypermetabolism, although the nodules were beneath the size threshold for PET sensitivity. Left chest port terminates at the cavoatrial junction. Incidental CT findings: Atherosclerotic calcifications of the aortic arch. Severe three-vessel coronary atherosclerosis. ABDOMEN/PELVIS: Widespread/diffuse hepatic metastases including a large aggregate metastasis in the central liver. Associated hypermetabolism, max SUV 6.5. Although the unenhanced CT is difficult to compare to the prior enhanced study, this appears progressive. No abnormal hypermetabolism in the pancreas, spleen, or adrenal glands. Widespread upper abdominal and periaortic nodal metastases. Representative 2.9 cm short axis portacaval node, grossly unchanged, max SUV 15.7. Overall appearance is similar.  Small volume perihepatic and pelvic ascites, mildly progressive. No  associated hypermetabolism. Incidental CT findings: Cholelithiasis. 7 mm nonobstructing left lower pole renal calculus. Gastric bypass with gastrojejunostomy. Atherosclerotic calcifications the abdominal aorta and branch vessels. Sigmoid diverticulosis, without evidence of diverticulitis. Calcified uterine fibroids. SKELETON: Diffuse hypermetabolism throughout the visualized axial and appendicular skeleton, favored to reflect marrow stimulation. No focal hypermetabolism to suggest osseous metastases. Incidental CT findings: Degenerative changes of the visualized thoracolumbar spine. IMPRESSION: Widespread hepatic metastases, progressive. Thoracoabdominal nodal metastases, as above, grossly unchanged. Small bilateral pulmonary nodules noted on recent CT are not well visualized on the current study, possibly secondary to motion degradation. Small volume perihepatic and pelvic ascites, mildly progressive. Additional ancillary findings as above. Electronically Signed   By: Julian Hy M.D.   On: 04/22/2022 01:27   CT ABDOMEN PELVIS WO CONTRAST  Result Date: 04/21/2022 CLINICAL DATA:  GI bleeding. History of metastatic breast cancer. * Tracking Code: BO * EXAM: CT ABDOMEN AND PELVIS WITHOUT CONTRAST TECHNIQUE: Multidetector CT imaging of the abdomen and pelvis was performed following the standard protocol without IV contrast. RADIATION DOSE REDUCTION: This exam was performed according to the departmental dose-optimization program which includes automated exposure control, adjustment of the mA and/or kV according to patient size and/or use of iterative reconstruction technique. COMPARISON:  PET-CT 04/19/2022. CT of the chest, abdomen and pelvis 03/23/2022. FINDINGS: Lower chest: Atherosclerosis of the aorta and coronary arteries. A small pericardial effusion is unchanged. Precordial lymph node measuring 1 cm short axis on image 6/2 appears similar to recent CT. There is subpleural reticulation at both lung bases  with scattered small pulmonary nodules, felt to be suspicious for metastatic disease on recent CT. Hepatobiliary: Widespread hepatic metastatic disease has progressed from previous CTs. Please refer to recent PET-CT. There are small calcified gallstones with mild chronic gallbladder wall thickening. No significant biliary dilatation. Pancreas: Unremarkable. No pancreatic ductal dilatation or surrounding inflammatory changes. Spleen: Normal in size without focal abnormality. Adrenals/Urinary Tract: The adrenal glands appear stable without suspicious findings. Diffuse renovascular calcifications are present bilaterally. There are nonobstructing calculi in the lower pole of the left kidney measuring up to 7 mm in diameter. No evidence of ureteral calculus or hydronephrosis. The bladder appears unremarkable for its degree of distention. Stomach/Bowel: No enteric contrast administered. There are postsurgical changes from previous gastric bypass procedure. No small bowel distension, wall thickening or surrounding inflammation. There is mild nonspecific wall thickening in the sigmoid colon associated with underlying diverticulosis. No definite acute surrounding inflammation. No evidence of bowel obstruction. Vascular/Lymphatic: Multiple enlarged lymph nodes are present within the porta hepatis and upper retroperitoneum consistent with metastatic disease. There is a portacaval node measuring 5.0 x 2.5 cm on image 31/2. Diffuse aortic and branch vessel atherosclerosis without evidence of aneurysm. Reproductive: Calcified uterine fibroid on the right. No suspicious adnexal findings. Other: Increased ascites compared with CT done earlier this month, primarily in the pelvis and around the liver. This appears similar in volume to more recent PET-CT. No focal extraluminal fluid collection or pneumoperitoneum. Musculoskeletal: No acute osseous findings. No focal lytic or blastic lesions are identified. There are degenerative  changes in the spine, greatest at L4-5. IMPRESSION: 1. No definite acute findings or explanation for GI bleeding on noncontrast CT. 2. Widespread hepatic metastatic disease has progressed from previous CTS. Upper abdominal lymphadenopathy consistent with metastatic disease. See recent PET-CT. 3. Increased ascites compared with CT done earlier this month, primarily in the pelvis and around the liver. This appears similar  in volume to more recent PET-CT. 4. Sigmoid diverticulosis with mild nonspecific wall thickening. No evidence of bowel obstruction or perforation. 5. Nonobstructing left renal calculi. 6. Cholelithiasis with chronic gallbladder wall thickening. 7.  Aortic Atherosclerosis (ICD10-I70.0). Electronically Signed   By: Richardean Sale M.D.   On: 04/21/2022 11:50      Subjective: Seen this morning along with spouse at bedside.  Patient reports that anytime she urinates she has a BM.  Had 2 BMs earlier this morning, no red-colored blood.  Stools changing to normal color from prior black tarry stools on admission.  Tolerating diet.  No pain reported.  Patient is unclear why she was on aspirin but advised to take it by her cardiologist.  Discharge Exam:  Vitals:   04/23/22 0506 04/23/22 1329 04/23/22 2118 04/24/22 0611  BP: 127/79 (!) 125/57 133/70 (!) 147/75  Pulse: 89 82  82  Resp: 16 18 19 16   Temp: (!) 97.5 F (36.4 C) 98 F (36.7 C) 98.3 F (36.8 C) 98.2 F (36.8 C)  TempSrc: Oral Oral Oral Oral  SpO2: 97% 97% 99% 97%  Weight:      Height:        General: Pleasant middle-aged female, moderately built and nourished sitting up comfortably in reclining chair without distress. Cardiovascular: S1 & S2 heard, RRR, S1/S2 +. No murmurs, rubs, gallops or clicks. No JVD or pedal edema.  Telemetry personally reviewed: Sinus rhythm. Respiratory: Clear to auscultation without wheezing, rhonchi or crackles. No increased work of breathing. Abdominal:  Non distended, non tender & soft. No  organomegaly or masses appreciated. Normal bowel sounds heard. CNS: Alert and oriented. No focal deficits. Extremities: no edema, no cyanosis    The results of significant diagnostics from this hospitalization (including imaging, microbiology, ancillary and laboratory) are listed below for reference.     Microbiology: No results found for this or any previous visit (from the past 240 hour(s)).   Labs: CBC: Recent Labs  Lab 04/22/22 0020 04/22/22 0837 04/22/22 1500 04/23/22 0401 04/24/22 0415  WBC 38.9* 40.3* 39.5* 36.8* 38.5*  NEUTROABS  --   --  31.9* 29.6* 30.6*  HGB 8.5*  8.5* 8.8* 8.2* 8.9* 9.2*  HCT 26.0*  25.7* 27.1* 25.6* 27.7* 28.8*  MCV 96.3 97.5 99.2 99.6 99.3  PLT 267 310 312 [id number]    Basic Metabolic Panel: Recent Labs  Lab 04/21/22 0919 04/22/22 0020 04/22/22 0837 04/23/22 0401 04/24/22 0415  NA 135 135  --  137 136  K 4.7 5.6* 5.4* 4.3 3.9  CL 105 107  --  111 110  CO2 19* 19*  --  19* 21*  GLUCOSE 206* 143*  --  138* 104*  BUN 62* 56*  --  44* 38*  CREATININE 2.20* 2.13*  --  1.77* 1.59*  CALCIUM 8.7* 8.2*  --  8.2* 8.3*  MG  --   --   --   --  1.7    Liver Function Tests: Recent Labs  Lab 04/21/22 0919  AST 59*  ALT 22  ALKPHOS 214*  BILITOT 0.7  PROT 5.0*  ALBUMIN 2.7*    CBG: Recent Labs  Lab 04/23/22 0734 04/23/22 1134 04/23/22 1711 04/23/22 2135 04/24/22 0755  GLUCAP 122* 169* 148* 120* 98    Hgb A1c Recent Labs    04/22/22 0837  HGBA1C 7.0*    I discussed in detail with patient's spouse at bedside, updated care and answered all questions.   Time coordinating discharge: 25 minutes  SIGNED:  Vernell Leep, MD,  FACP, [place], [place], [facility], [facility]   [facility] Phys[facility] Advisor [place]     To contact the attending provider between 7A-7P or the covering provider during after hours 7P-7A, please log into the web site [email or web address] and access using universal  password for that web  site. If you do not have the password, please call the hospital operator.

## 2022-04-24 NOTE — TOC Progression Note (Signed)
Transition of Care [facility]) - Progression Note    Patient Details  Name: Darlene [name] MRN: [id number] Date of Birth: 05/28/1950  Transition of Care [facility]) CM/SW Contact  Purcell Mouton, RN Phone Number: 04/24/2022, 10:10 AM  Clinical Narrative:    Spoke with pt concerning Home Health PT. Pt states, It's been a long time since I used [facility] agency, so it does not matter. Alvis Lemmings will follow pt at home for HHPT.    Expected Discharge Plan: [place] Barriers to Discharge: No Barriers Identified  Expected Discharge Plan and Services     Post Acute Care Choice: [place] arrangements for the past 2 months: Single Family Home Expected Discharge Date: 04/24/22                         HH Arranged: PT HH Agency: [place] Date [place]: 04/24/22 Time [place]: 1009 Representative spoke with at [place]: [place] Determinants of Health ([place]) Interventions SDOH Screenings   Food Insecurity: No Food Insecurity (04/23/2022)  Housing: Low Risk  (04/23/2022)  Transportation Needs: No Transportation Needs (04/23/2022)  Utilities: Not At Risk (04/23/2022)  Alcohol Screen: Low Risk  (05/19/2021)  Depression (PHQ2-9): Low Risk  (04/10/2022)  Financial Resource Strain: Low Risk  (05/19/2021)  Physical Activity: Unknown (05/19/2021)  Social Connections: Socially Integrated (05/19/2021)  Stress: No Stress Concern Present (05/19/2021)  Tobacco Use: Low Risk  (04/23/2022)    Readmission Risk Interventions     No data to display

## 2022-04-25 ENCOUNTER — Telehealth: Payer: Self-pay

## 2022-04-25 ENCOUNTER — Telehealth: Payer: Self-pay | Admitting: Internal Medicine

## 2022-04-25 DIAGNOSIS — R531 Weakness: Secondary | ICD-10-CM

## 2022-04-25 DIAGNOSIS — K922 Gastrointestinal hemorrhage, unspecified: Secondary | ICD-10-CM

## 2022-04-25 DIAGNOSIS — R2681 Unsteadiness on feet: Secondary | ICD-10-CM

## 2022-04-25 NOTE — Telephone Encounter (Signed)
Pt was discharged from [facility] yesterday  Pt is feeling very weak and unstable to walk.  Pt is requesting a bedside commode  Please advise.

## 2022-04-25 NOTE — Transitions of Care (Post Inpatient/ED Visit) (Signed)
04/25/2022  Name: Darlene [name] MRN: [id number] DOB: September 14, 1950  Today's TOC FU Call Status: Today's TOC FU Call Status:: Successful TOC FU Call Competed TOC FU Call Complete Date: 04/25/22  Transition Care Management Follow-up Telephone Call Date of Discharge: 04/24/22 Discharge Facility: [facility]) Type of Discharge: Inpatient Admission Primary Inpatient Discharge Diagnosis:: "acute GI bleeding" How have you been since you were released from the hospital?: Same (Pt statses she is "doing ok' since returning home. She did accidentally trip on curb coming into home and has a gash on knee. Area is a little sore and tender. States she had normal BM this morning.) Any questions or concerns?: Yes Patient Questions/Concerns:: Pt states she is still weak-needs assistance walking and has to get up several times per night to urinate-requested BSC. Patient Questions/Concerns Addressed: Notified Provider of Patient Questions/Concerns, Other: (RN CM called PCP office to make aware of fall and pt request for DME)  Items Reviewed: Did you receive and understand the discharge instructions provided?: Yes Medications obtained and verified?: Yes (Medications Reviewed) Any new allergies since your discharge?: No Dietary orders reviewed?: Yes Type of Diet Ordered:: low salt/heart healthy/carb modified Do you have support at home?: Yes People in Home: spouse Name of Support/Comfort Primary Source: [facility] and Equipment/Supplies: [place] Ordered?: Yes Name of [place]:: [place] Has Agency set up a time to come to your home?: No (Explained to pt/spouse that agency should contact her within 48hrs post discharge-confirmed they have numebr to call if they do not hear from agency) Any new equipment or medical supplies ordered?: No  Functional Questionnaire: Do you need assistance with bathing/showering or dressing?: Yes Do you need assistance with meal  preparation?: Yes Do you need assistance with eating?: No Do you have difficulty maintaining continence: Yes Do you need assistance with getting out of bed/getting out of a chair/moving?: Yes Do you have difficulty managing or taking your medications?: Yes  Follow up appointments reviewed: PCP Follow-up appointment confirmed?: Yes Date of PCP follow-up appointment?: 05/07/22 Follow-up Provider: Dr. Isaac Bliss Specialist [facility] Follow-up appointment confirmed?: No Reason Specialist Follow-Up Not Confirmed: Patient has Specialist Provider Number and will Call for Appointment (pt aware to make GI MD appt and has office number to call and schedule appt) Do you need transportation to your follow-up appointment?: Yes (pt states she relies on friends to take her to appts but has difficulty at times getting to appts-referrral placed to care guide for transportation resources, instructed pt to contact [employer] to see if she has transportation benefit) Transportation Need Intervention Addressed By:: AMB Referral For SDOH Needs Do you understand care options if your condition(s) worsen?: Yes-patient verbalized understanding  SDOH Interventions Today    Flowsheet Row Most Recent Value  SDOH Interventions   Food Insecurity Interventions Intervention Not Indicated  Transportation Interventions Other (Comment)  [referral to care guide placed, instructed pt to contact [employer] to inquire if she has transportation benefit]      TOC Interventions Today    Flowsheet Row Most Recent Value  TOC Interventions   TOC Interventions Discussed/Reviewed TOC Interventions Discussed, Arranged PCP follow up less than 12 days/Care Guide scheduled, Contacted provider for patient needs  [pt requesting BSC-called PCP to make request]      Interventions Today    Flowsheet Row Most Recent Value  General Interventions   General Interventions Discussed/Reviewed General Interventions Discussed, Doctor Visits,  Communication with  Doctor Visits Discussed/Reviewed PCP, Doctor Visits Discussed, Specialist  Communication with PCP/Specialists  [pt requesting to speak w/palliative care team at cancer ctr-inbasket message sent to RN &SW]  Education Interventions   Education Provided Provided Education  Provided Verbal Education On Nutrition, When to see the doctor, Medication, Other  Nutrition Interventions   Nutrition Discussed/Reviewed Nutrition Discussed, Adding fruits and vegetables, Increaing proteins, Decreasing sugar intake  Pharmacy Interventions   Pharmacy Dicussed/Reviewed Pharmacy Topics Discussed, Medications and their functions  Safety Interventions   Safety Discussed/Reviewed Safety Discussed, Fall Risk, [place]  [pt has cane,walker and wheelchair in [place], RN,BSN,CCM [facility] Care Management Care Management Community Coordinator Direct Phone: [phone] Toll Free: [phone] Fax: [phone]

## 2022-04-25 NOTE — Telephone Encounter (Signed)
   Telephone encounter was:  Successful.  04/25/2022 Name: Darlene [name] MRN: [id number] DOB: 1951-01-03  Darlene [name] is a 72 y.o. year old female who is a primary care patient of Isaac Bliss, Rayford Halsted, MD . The community resource team was consulted for assistance with Transportation Needs   Care guide performed the following interventions: Spoke with patient about [place] transportation, verified patient's home address to mail application. Inquired if patient needed transportation to any upcoming appointments and she responded she did not. Inquired if patient had any other needs and she responded she did not.  Follow Up Plan:  No further follow up planned at this time. The patient has been provided with needed resources.  [place] Resource Care Guide   [email or web address]  ?? [id number]   Website: [email or web address]  [email or web address]

## 2022-04-25 NOTE — Telephone Encounter (Signed)
Order placed

## 2022-04-26 ENCOUNTER — Telehealth: Payer: Self-pay | Admitting: Internal Medicine

## 2022-04-26 DIAGNOSIS — C787 Secondary malignant neoplasm of liver and intrahepatic bile duct: Secondary | ICD-10-CM | POA: Diagnosis not present

## 2022-04-26 DIAGNOSIS — C50911 Malignant neoplasm of unspecified site of right female breast: Secondary | ICD-10-CM | POA: Diagnosis not present

## 2022-04-26 DIAGNOSIS — R188 Other ascites: Secondary | ICD-10-CM | POA: Diagnosis not present

## 2022-04-26 DIAGNOSIS — K289 Gastrojejunal ulcer, unspecified as acute or chronic, without hemorrhage or perforation: Secondary | ICD-10-CM | POA: Diagnosis not present

## 2022-04-26 DIAGNOSIS — C772 Secondary and unspecified malignant neoplasm of intra-abdominal lymph nodes: Secondary | ICD-10-CM | POA: Diagnosis not present

## 2022-04-26 DIAGNOSIS — R918 Other nonspecific abnormal finding of lung field: Secondary | ICD-10-CM | POA: Diagnosis not present

## 2022-04-26 DIAGNOSIS — D62 Acute posthemorrhagic anemia: Secondary | ICD-10-CM | POA: Diagnosis not present

## 2022-04-26 DIAGNOSIS — N179 Acute kidney failure, unspecified: Secondary | ICD-10-CM | POA: Diagnosis not present

## 2022-04-26 DIAGNOSIS — I130 Hypertensive heart and chronic kidney disease with heart failure and stage 1 through stage 4 chronic kidney disease, or unspecified chronic kidney disease: Secondary | ICD-10-CM | POA: Diagnosis not present

## 2022-04-26 NOTE — Telephone Encounter (Signed)
Continue physical therapy 2x7/1x1 adding in home health aid 2x5

## 2022-04-27 NOTE — Progress Notes (Signed)
Patient Care Team: Philip Aspen, Limmie Patricia, MD as PCP - General (Internal Medicine) Laurey Morale, MD as PCP - Advanced Heart Failure (Cardiology) Almond Lint, MD as Consulting Physician (General Surgery) Dorisann Frames, MD as Consulting Physician (Endocrinology) Glenna Fellows, MD as Consulting Physician (Plastic Surgery) Antony Blackbird, MD as Consulting Physician (Radiation Oncology) Charna Elizabeth, MD as Consulting Physician (Gastroenterology) Glendale Chard, DO as Consulting Physician (Neurology) Wilder Glade, MD as Consulting Physician (Family Medicine) Stephannie Li, MD as Consulting Physician (Ophthalmology) Romualdo Bolk, MD as Consulting Physician (Obstetrics and Gynecology) Anselm Lis, RPH-CPP (Pharmacist) Serena Croissant, MD as Consulting Physician (Hematology and Oncology)  DIAGNOSIS: No diagnosis found.  SUMMARY OF ONCOLOGIC HISTORY: Oncology History  Breast cancer of upper-outer quadrant of right female breast  07/19/2014 Initial Biopsy   (R) breast needle biopsy (10:00): IDC, DCIS, grade 2. ER-, PR-, HER2+ (ratio 2.42). Ki67 90%.    07/22/2014 Initial Diagnosis   Breast cancer of upper-outer quadrant of right female breast   08/08/2014 - 11/21/2014 Neo-Adjuvant Chemotherapy   Taxotere/Carbo/Herceptin/Perjeta x 6 cycles.    12/12/2014 - 10/02/2015 Chemotherapy   Maintenance Herceptin (to complete 1 year of therapy).    12/27/2014 Surgery   (R) lumpectomy with SLNB [facility]): IDC, grade 2, spanning 4 cm, associated high grade DCIS. Negative margins. 1/3 SLN (+).   ER/PR repeated and remain negative.   ypT2, ypN1a: Stage IIB   01/06/2015 Surgery   Bilateral breast mammoplasty ([place]): Right breast path with intralymphatic emboli of ductal carcinoma. Left breast benign.    02/02/2015 Imaging   CT chest: No findings of metastatic disease in the chest. Right axillary fluid density lesion likely postoperative seroma or hematoma. Small left  subpectoral nodes warrant followup attention.   02/02/2015 Imaging   Bone scan: No scintigraphic evidence of osseous metastatic disease   02/16/2015 Surgery   (R) mastectomy and ALND [facility]): Scattered foci of high grade DCIS, scattered intralymphatic tumor emboli. Margins negative. 0/15 LNs.    04/05/2015 - 05/19/2015 Radiation Therapy   Adjuvant XRT ([place]). The right chest wall was treated to 50.4 Gy in 28 fractions at 1.8 Gy per fraction.  The right mastectomy scar was treated to 10 Gy in 5 fractions at 2 Gy per fraction.    12/06/2015 PET scan   Right subpectoral lymph node measuring 9 mm and exhibits malignant range FDG uptake, suspicious for metastatic adenopathy. Subcutaneous nodule within the upper left chest wall exhibits malignant range uptake, which is indeterminate; this is at the site of previous Port-A-Cath and is favored to represent postsurgical change. Indeterminate left adrenal nodules which exhibit mild uptake, favored to represent benign adenoma.   08/11/2017 - 11/03/2017 Chemotherapy   The patient had ado-trastuzumab emtansine (KADCYLA) 320 mg in sodium chloride 0.9 % 250 mL chemo infusion, 3.6 mg/kg = 320 mg, Intravenous, Once, 4 of 5 cycles Administration: 320 mg (08/12/2017), 320 mg (09/01/2017), 320 mg (09/23/2017)  for chemotherapy treatment.    11/18/2017 - 08/30/2020 Chemotherapy   The patient had trastuzumab (HERCEPTIN) 546 mg in sodium chloride 0.9 % 250 mL chemo infusion, 6 mg/kg = 546 mg (100 % of original dose 6 mg/kg), Intravenous,  Once, 2 of 2 cycles Dose modification: 6 mg/kg (original dose 6 mg/kg, Cycle 1, Reason: Provider Judgment) Administration: 546 mg (11/18/2017), 546 mg (12/16/2017) pertuzumab (PERJETA) 420 mg in sodium chloride 0.9 % 250 mL chemo infusion, 420 mg (100 % of original dose 420 mg), Intravenous, Once, 15 of 15 cycles Dose modification:  420 mg (original dose 420 mg, Cycle 18), 420 mg (original dose 420 mg, Cycle 27) Administration: 420 mg  (10/06/2018), 420 mg (10/27/2018), 420 mg (11/18/2018), 420 mg (12/08/2018), 420 mg (12/29/2018), 420 mg (01/19/2019), 420 mg (02/16/2019), 420 mg (03/16/2019), 420 mg (04/13/2019), 420 mg (06/08/2019), 420 mg (07/06/2019), 420 mg (08/03/2019), 420 mg (08/31/2019), 420 mg (09/28/2019), 420 mg (05/19/2019) trastuzumab-anns (KANJINTI) 546 mg in sodium chloride 0.9 % 250 mL chemo infusion, 6 mg/kg = 546 mg (100 % of original dose 6 mg/kg), Intravenous,  Once, 30 of 30 cycles Dose modification: 6 mg/kg (original dose 6 mg/kg, Cycle 3, Reason: Other (see comments), Comment: requested step therapy by [facility] insurance), 4 mg/kg (original dose 6 mg/kg, Cycle 9, Reason: Provider Judgment), 6 mg/kg (original dose 6 mg/kg, Cycle 17, Reason: Provider Judgment), 4 mg/kg (original dose 6 mg/kg, Cycle 27, Reason: Provider Judgment), 6 mg/kg (original dose 6 mg/kg, Cycle 27, Reason: Provider Judgment) Administration: 546 mg (01/13/2018), 546 mg (02/10/2018), 546 mg (03/03/2018), 546 mg (03/24/2018), 546 mg (04/14/2018), 546 mg (05/05/2018), 357 mg (05/26/2018), 357 mg (06/09/2018), 357 mg (06/23/2018), 357 mg (07/07/2018), 357 mg (07/21/2018), 357 mg (08/04/2018), 357 mg (08/18/2018), 357 mg (09/01/2018), 546 mg (09/15/2018), 546 mg (10/06/2018), 546 mg (10/27/2018), 546 mg (11/18/2018), 546 mg (12/08/2018), 546 mg (12/29/2018), 546 mg (01/19/2019), 546 mg (02/16/2019), 480 mg (03/16/2019), 483 mg (04/13/2019), 450 mg (06/08/2019), 450 mg (07/06/2019), 450 mg (08/03/2019), 450 mg (08/31/2019), 450 mg (09/28/2019)  for chemotherapy treatment.    12/31/2017 - 09/01/2018 Chemotherapy   The patient had PACLitaxel-protein bound (ABRAXANE) chemo infusion 175 mg, 80 mg/m2 = 175 mg (100 % of original dose 80 mg/m2), Intravenous,  Once, 5 of 10 cycles Dose modification: 80 mg/m2 (original dose 80 mg/m2, Cycle 11) Administration: 175 mg (07/07/2018), 175 mg (07/21/2018), 175 mg (08/04/2018), 175 mg (08/18/2018), 175 mg (09/01/2018) PACLitaxel-protein bound (ABRAXANE) chemo infusion  200 mg, 100 mg/m2 = 200 mg, Intravenous, Once, 10 of 10 cycles Dose modification: 80 mg/m2 (original dose 100 mg/m2, Cycle 2, Reason: Provider Judgment), 80 mg/m2 (original dose 100 mg/m2, Cycle 2, Reason: Provider Judgment) Administration: 200 mg (12/31/2017), 200 mg (01/06/2018), 175 mg (01/20/2018), 175 mg (01/27/2018), 175 mg (02/10/2018), 175 mg (03/03/2018), 175 mg (03/10/2018), 175 mg (03/24/2018), 175 mg (04/07/2018), 175 mg (04/14/2018), 175 mg (04/28/2018), 175 mg (05/05/2018), 175 mg (05/26/2018), 175 mg (06/09/2018), 175 mg (05/19/2018)  for chemotherapy treatment.    10/05/2020 - 01/18/2021 Chemotherapy   Patient is on Treatment Plan : BREAST METASTATIC fam-trastuzumab deruxtecan-nxki (Enhertu) q21d     02/19/2021 - 05/14/2021 Chemotherapy   Patient is on Treatment Plan : BREAST Trastuzumab q21d x 13 cycles     06/20/2021 - 08/01/2021 Chemotherapy   Patient is on Treatment Plan : BREAST METASTATIC fam-trastuzumab deruxtecan-nxki (Enhertu) q21d     07/25/2021 - 10/19/2021 Chemotherapy   Patient is on Treatment Plan : BREAST METASTATIC Sacituzumab govitecan-hziy Drinda Butts) D1,8 q21d     08/31/2021 - 09/21/2021 Chemotherapy   Patient is on Treatment Plan : BREAST METASTATIC Sacituzumab govitecan-hziy Drinda Butts) q21d     01/30/2022 - 03/18/2022 Chemotherapy   Patient is on Treatment Plan : BREAST METASTATIC Eribulin D1,8 q21d     04/02/2022 -  Chemotherapy   Patient is on Treatment Plan : BREAST Carboplatin + Gemcitabine D1,8 q21d     Metastasis to liver  08/04/2017 Initial Diagnosis   Metastasis to liver (HCC)   08/12/2017 - 10/14/2017 Chemotherapy   The patient had ado-trastuzumab emtansine (KADCYLA) 320 mg in sodium chloride 0.9 %  250 mL chemo infusion, 3.6 mg/kg = 320 mg, Intravenous, Once, 4 of 5 cycles Administration: 320 mg (08/12/2017), 320 mg (09/01/2017), 320 mg (09/23/2017)  for chemotherapy treatment.    11/18/2017 - 08/30/2020 Chemotherapy   The patient had trastuzumab (HERCEPTIN) 546 mg in  sodium chloride 0.9 % 250 mL chemo infusion, 6 mg/kg = 546 mg (100 % of original dose 6 mg/kg), Intravenous,  Once, 2 of 2 cycles Dose modification: 6 mg/kg (original dose 6 mg/kg, Cycle 1, Reason: Provider Judgment) Administration: 546 mg (11/18/2017), 546 mg (12/16/2017) pertuzumab (PERJETA) 420 mg in sodium chloride 0.9 % 250 mL chemo infusion, 420 mg (100 % of original dose 420 mg), Intravenous, Once, 15 of 15 cycles Dose modification: 420 mg (original dose 420 mg, Cycle 18), 420 mg (original dose 420 mg, Cycle 27) Administration: 420 mg (10/06/2018), 420 mg (10/27/2018), 420 mg (11/18/2018), 420 mg (12/08/2018), 420 mg (12/29/2018), 420 mg (01/19/2019), 420 mg (02/16/2019), 420 mg (03/16/2019), 420 mg (04/13/2019), 420 mg (06/08/2019), 420 mg (07/06/2019), 420 mg (08/03/2019), 420 mg (08/31/2019), 420 mg (09/28/2019), 420 mg (05/19/2019) trastuzumab-anns (KANJINTI) 546 mg in sodium chloride 0.9 % 250 mL chemo infusion, 6 mg/kg = 546 mg (100 % of original dose 6 mg/kg), Intravenous,  Once, 30 of 30 cycles Dose modification: 6 mg/kg (original dose 6 mg/kg, Cycle 3, Reason: Other (see comments), Comment: requested step therapy by [facility] insurance), 4 mg/kg (original dose 6 mg/kg, Cycle 9, Reason: Provider Judgment), 6 mg/kg (original dose 6 mg/kg, Cycle 17, Reason: Provider Judgment), 4 mg/kg (original dose 6 mg/kg, Cycle 27, Reason: Provider Judgment), 6 mg/kg (original dose 6 mg/kg, Cycle 27, Reason: Provider Judgment) Administration: 546 mg (01/13/2018), 546 mg (02/10/2018), 546 mg (03/03/2018), 546 mg (03/24/2018), 546 mg (04/14/2018), 546 mg (05/05/2018), 357 mg (05/26/2018), 357 mg (06/09/2018), 357 mg (06/23/2018), 357 mg (07/07/2018), 357 mg (07/21/2018), 357 mg (08/04/2018), 357 mg (08/18/2018), 357 mg (09/01/2018), 546 mg (09/15/2018), 546 mg (10/06/2018), 546 mg (10/27/2018), 546 mg (11/18/2018), 546 mg (12/08/2018), 546 mg (12/29/2018), 546 mg (01/19/2019), 546 mg (02/16/2019), 480 mg (03/16/2019), 483 mg (04/13/2019), 450 mg  (06/08/2019), 450 mg (07/06/2019), 450 mg (08/03/2019), 450 mg (08/31/2019), 450 mg (09/28/2019)  for chemotherapy treatment.    01/30/2022 - 03/18/2022 Chemotherapy   Patient is on Treatment Plan : BREAST METASTATIC Eribulin D1,8 q21d     04/02/2022 -  Chemotherapy   Patient is on Treatment Plan : BREAST Carboplatin + Gemcitabine D1,8 q21d       CHIEF COMPLIANT: Carboplatin and gemcitabine   INTERVAL HISTORY: Darlene [name] is a  72 Year-old- with the above mentioned history of metastatic breast cancer who is currently on chemotherapy with carboplatin and gemcitabine.  Today cycle 2.  ALLERGIES:  is allergic to antihistamines, diphenhydramine-type; nsaids; peanut allergen powder-dnfp; and tape.  MEDICATIONS:  Current Outpatient Medications  Medication Sig Dispense Refill   Accu-Chek Softclix Lancets lancets 1 each by Other route 4 (four) times daily. Test blood sugars 4 x times daily 200 each 1   [START ON 04/29/2022] aspirin EC 81 MG tablet Take 1 tablet (81 mg total) by mouth daily.     Bempedoic Acid-Ezetimibe (NEXLIZET) 180-10 MG TABS Take 1 tablet by mouth daily. 90 tablet 3   Blood Glucose Calibration (ACCU-CHEK GUIDE CONTROL) LIQD 1 Bottle by In Vitro route as needed. 1 each 0   Blood Glucose Monitoring Suppl (ACCU-CHEK GUIDE) w/Device KIT 1 each by Does not apply route 4 (four) times daily. Use to test blood sugars 4x a day  1 kit 0   buPROPion (WELLBUTRIN SR) 200 MG 12 hr tablet Take one tablet by mouth once daily. (Patient taking differently: Take 200 mg by mouth daily. Take one tablet by mouth once daily.) 90 tablet 0   CALCIUM CITRATE-VITAMIN D PO Take 1 tablet by mouth 3 (three) times daily.      carvedilol (COREG) 6.25 MG tablet Take 1 tablet (6.25 mg total) by mouth 2 (two) times daily. 180 tablet 3   cholecalciferol (VITAMIN D) 1000 units tablet Take 1,000 Units by mouth daily.     Cyanocobalamin (VITAMIN B-12 PO) Take 1 tablet by mouth every 14 (fourteen) days.      glucose blood (ACCU-CHEK GUIDE) test strip Use Accu Chek test strips to check blood sugar 4 times daily. DX:E11.21 400 each 3   insulin aspart (NOVOLOG) 100 UNIT/ML injection Inject into the skin daily as needed for high blood sugar. Per patient, sliding scale with meals as needed     insulin glargine (LANTUS SOLOSTAR) 100 UNIT/ML Solostar Pen Inject 11 Units into the skin daily.     KERENDIA 20 MG TABS TAKE 1 TABLET EVERY DAY (Patient taking differently: Take 20 mg by mouth daily.) 90 tablet 3   Lactobacillus ([employer] ACIDOPHILUS PROBIOTIC) 0.5 MG TABS Take 1 tablet by mouth daily.     loperamide (IMODIUM) 2 MG capsule Take 2 mg by mouth as needed for diarrhea or loose stools.     loratadine (CLARITIN) 10 MG tablet Take 10 mg by mouth daily.     melatonin 5 MG TABS Take 5 mg by mouth at bedtime.     Multiple Vitamins-Minerals (OPURITY) TABS Take 1 tablet by mouth 2 (two) times daily.     omeprazole (PRILOSEC) 40 MG capsule Take 1 capsule (40 mg total) by mouth 2 (two) times daily before a meal. Open capsule, sprinkle granules in applesauce  & take two times daily for 8 weeks then consider reducing to once daily. 60 capsule 1   ondansetron (ZOFRAN) 8 MG tablet Take 1 tablet (8 mg total) by mouth every 8 (eight) hours as needed for nausea or vomiting. 30 tablet 2   polyethylene glycol (MIRALAX / GLYCOLAX) 17 g packet Take 17 g by mouth daily as needed for moderate constipation.     prochlorperazine (COMPAZINE) 10 MG tablet Take 1 tablet (10 mg total) by mouth every 6 (six) hours as needed (Nausea or vomiting). 30 tablet 6   Propylene Glycol (SYSTANE BALANCE OP) Place 1-2 drops into both eyes 3 (three) times daily as needed (for dry eyes).      SURE COMFORT PEN NEEDLES 32G X 4 MM MISC Use as instructed to inject insulin and Victoza daily. 200 each 0   tobramycin (TOBREX) 0.3 % ophthalmic ointment Place 1 application into both eyes as needed (Takes day before eye injections).     vortioxetine HBr  (TRINTELLIX) 20 MG TABS tablet Take 1 tablet (20 mg total) by mouth at bedtime. 90 tablet 0   No current facility-administered medications for this visit.   Facility-Administered Medications Ordered in Other Visits  Medication Dose Route Frequency Provider Last Rate Last Admin   acetaminophen (TYLENOL) 325 MG tablet            diphenhydrAMINE (BENADRYL) 50 MG/ML injection            famotidine (PEPCID) 20-0.9 MG/50ML-% IVPB            palonosetron (ALOXI) 0.25 MG/5ML injection  PHYSICAL EXAMINATION: ECOG PERFORMANCE STATUS: {CHL ONC ECOG [phone]}  There were no vitals filed for this visit. There were no vitals filed for this visit.  BREAST:*** No palpable masses or nodules in either right or left breasts. No palpable axillary supraclavicular or infraclavicular adenopathy no breast tenderness or nipple discharge. (exam performed in the presence of a chaperone)  LABORATORY DATA:  I have reviewed the data as listed    Latest Ref Rng & Units 04/24/2022    4:15 AM 04/23/2022    4:01 AM 04/22/2022    8:37 AM  CMP  Glucose 70 - 99 mg/dL 161  096    BUN 8 - 23 mg/dL 38  44    Creatinine 0.45 - 1.00 mg/dL 4.09  8.11    Sodium 914 - 145 mmol/L 136  137    Potassium 3.5 - 5.1 mmol/L 3.9  4.3  5.4   Chloride 98 - 111 mmol/L 110  111    CO2 22 - 32 mmol/L 21  19    Calcium 8.9 - 10.3 mg/dL 8.3  8.2      Lab Results  Component Value Date   WBC 38.5 (H) 04/24/2022   HGB 9.2 (L) 04/24/2022   HCT 28.8 (L) 04/24/2022   MCV 99.3 04/24/2022   PLT 364 04/24/2022   NEUTROABS 30.6 (H) 04/24/2022    ASSESSMENT & PLAN:  No problem-specific Assessment & Plan notes found for this encounter.    No orders of the defined types were placed in this encounter.  The patient has a good understanding of the overall plan. she agrees with it. she will call with any problems that may develop before the next visit here. Total time spent: 30 mins including face to face time and time spent  for planning, charting and co-ordination of care   Sherlyn Lick, CMA 04/27/22    I Janan Ridge am acting as a Neurosurgeon for [employer]  ***

## 2022-04-29 ENCOUNTER — Inpatient Hospital Stay (HOSPITAL_BASED_OUTPATIENT_CLINIC_OR_DEPARTMENT_OTHER): Payer: Medicare PPO | Admitting: Hematology and Oncology

## 2022-04-29 ENCOUNTER — Encounter: Payer: Self-pay | Admitting: *Deleted

## 2022-04-29 ENCOUNTER — Other Ambulatory Visit: Payer: Self-pay

## 2022-04-29 ENCOUNTER — Inpatient Hospital Stay: Payer: Medicare PPO

## 2022-04-29 ENCOUNTER — Other Ambulatory Visit: Payer: Self-pay | Admitting: *Deleted

## 2022-04-29 ENCOUNTER — Inpatient Hospital Stay: Payer: Medicare PPO | Attending: Hematology and Oncology

## 2022-04-29 VITALS — BP 120/56 | HR 86 | Temp 97.7°F | Resp 18 | Ht 69.0 in | Wt 179.5 lb

## 2022-04-29 DIAGNOSIS — C50411 Malignant neoplasm of upper-outer quadrant of right female breast: Secondary | ICD-10-CM

## 2022-04-29 DIAGNOSIS — Z452 Encounter for adjustment and management of vascular access device: Secondary | ICD-10-CM | POA: Insufficient documentation

## 2022-04-29 DIAGNOSIS — Z66 Do not resuscitate: Secondary | ICD-10-CM | POA: Diagnosis not present

## 2022-04-29 DIAGNOSIS — C787 Secondary malignant neoplasm of liver and intrahepatic bile duct: Secondary | ICD-10-CM | POA: Diagnosis not present

## 2022-04-29 DIAGNOSIS — Z171 Estrogen receptor negative status [ER-]: Secondary | ICD-10-CM | POA: Insufficient documentation

## 2022-04-29 DIAGNOSIS — Z95828 Presence of other vascular implants and grafts: Secondary | ICD-10-CM

## 2022-04-29 LAB — CMP (CANCER CENTER ONLY)
ALT: 36 U/L (ref 0–44)
AST: 126 U/L — ABNORMAL HIGH (ref 15–41)
Albumin: 2.9 g/dL — ABNORMAL LOW (ref 3.5–5.0)
Alkaline Phosphatase: 308 U/L — ABNORMAL HIGH (ref 38–126)
Anion gap: 9 (ref 5–15)
BUN: 52 mg/dL — ABNORMAL HIGH (ref 8–23)
CO2: 21 mmol/L — ABNORMAL LOW (ref 22–32)
Calcium: 9.1 mg/dL (ref 8.9–10.3)
Chloride: 108 mmol/L (ref 98–111)
Creatinine: 1.93 mg/dL — ABNORMAL HIGH (ref 0.44–1.00)
GFR, Estimated: 28 mL/min — ABNORMAL LOW (ref >60)
Glucose, Bld: 140 mg/dL — ABNORMAL HIGH (ref 70–99)
Potassium: 5.5 mmol/L — ABNORMAL HIGH (ref 3.5–5.1)
Sodium: 138 mmol/L (ref 135–145)
Total Bilirubin: 0.5 mg/dL (ref 0.3–1.2)
Total Protein: 5.4 g/dL — ABNORMAL LOW (ref 6.5–8.1)

## 2022-04-29 LAB — CBC WITH DIFFERENTIAL (CANCER CENTER ONLY)
Abs Immature Granulocytes: 0.67 10*3/uL — ABNORMAL HIGH (ref 0.00–0.07)
Basophils Absolute: 0.1 10*3/uL (ref 0.0–0.1)
Basophils Relative: 0 %
Eosinophils Absolute: 0 10*3/uL (ref 0.0–0.5)
Eosinophils Relative: 0 %
HCT: 30 % — ABNORMAL LOW (ref 36.0–46.0)
Hemoglobin: 9.6 g/dL — ABNORMAL LOW (ref 12.0–15.0)
Immature Granulocytes: 3 %
Lymphocytes Relative: 5 %
Lymphs Abs: 1 10*3/uL (ref 0.7–4.0)
MCH: 31.6 pg (ref 26.0–34.0)
MCHC: 32 g/dL (ref 30.0–36.0)
MCV: 98.7 fL (ref 80.0–100.0)
Monocytes Absolute: 1.4 10*3/uL — ABNORMAL HIGH (ref 0.1–1.0)
Monocytes Relative: 7 %
Neutro Abs: 18.5 10*3/uL — ABNORMAL HIGH (ref 1.7–7.7)
Neutrophils Relative %: 85 %
Platelet Count: 454 10*3/uL — ABNORMAL HIGH (ref 150–400)
RBC: 3.04 MIL/uL — ABNORMAL LOW (ref 3.87–5.11)
RDW: 18.6 % — ABNORMAL HIGH (ref 11.5–15.5)
WBC Count: 21.7 10*3/uL — ABNORMAL HIGH (ref 4.0–10.5)
nRBC: 0.1 % (ref 0.0–0.2)

## 2022-04-29 LAB — CEA (ACCESS): CEA (CHCC): 6623.04 ng/mL — ABNORMAL HIGH (ref 0.00–5.00)

## 2022-04-29 MED ORDER — SODIUM CHLORIDE 0.9% FLUSH
10.0000 mL | Freq: Once | INTRAVENOUS | Status: AC
  Administered 2022-04-29: 10 mL

## 2022-04-29 MED ORDER — FUROSEMIDE 20 MG PO TABS: 10.0000 mg | ORAL_TABLET | Freq: Every day | ORAL | 0 refills | Status: DC

## 2022-04-29 NOTE — Progress Notes (Signed)
Per MD request RN placed called to [facility] to transition pt to Hospice care.

## 2022-04-29 NOTE — Assessment & Plan Note (Addendum)
07/19/2014: Right breast cancer grade 2 IDC ER/PR negative HER2 positive Ki-67 90% treated with neoadjuvant chemo with TCHP x6 cycles followed by Herceptin maintenance, right lumpectomy had residual disease 4 cm 1/3 lymph node positive, ER/PR negative, adjuvant radiation and capecitabine completed 09/27/2015 12/06/2015: Recurrence: Right subpectoral lymph node and subcutaneous nodule, liver mass (biopsy adenocarcinoma consistent with upper GI or pancreaticobiliary primary, HER2 negative, ER negative) July 2019: Metastatic disease: TDM 1 followed by Herceptin maintenance, pertuzumab added 10/06/2018, Abraxane 12/31/2017-09/01/2018, Herceptin and pertuzumab maintenance   Prior treatment: Enhertu started 10/05/2020 switched to Herceptin maintenance 02/19/2021 switched to Enhertu at higher dosage x 3 cycles discontinued 08/01/2021 , Xeloda  started 11/12/2021 discontinued December 2023 for progression   02/04/2021: PET CT scan: Complete metabolic response, no evidence of hypermetabolic disease in the liver (mass measured 2.7 cm previously is now 1.5 cm with no uptake)    11/02/2021: CT CAP: Increase in the hepatic metastatic lesions (7.5 cm to 8.3 cm) similar size of aortocaval, periportal, gastrohepatic lymph nodes ------------------------------------------------------------------------------------------------------------------------------------------- CT CAP 01/18/2022: Progressive metastatic disease in the liver (10.7 cm x 13 cm) and lymph nodes in the upper abdomen and retroperitoneum (2.9 cm and 2.3 cm and 1.7 cm)   Current treatment: Carboplatin and gemcitabine started 04/02/2022   Current Treatment: Discontinuing chemo because of rapidly declining performance status.     Hospitalization 04/21/2022-04/24/2022: GI bleeding (endoscopy revealed 2 ulcers in the stomach at the anastomotic line: Clotting General), given 2 units of PRBC   Prognosis: Patient is extremely poor prognosis and given the rapidly  declining performance status I did not recommend any further chemotherapy. We will contact without oral care and transition her to hospice care. Patient's husband reported that he is lost and needs a lot of help himself to help take care of her through her end of life.

## 2022-04-30 ENCOUNTER — Telehealth: Payer: Self-pay | Admitting: Hematology and Oncology

## 2022-04-30 ENCOUNTER — Ambulatory Visit
Admission: RE | Admit: 2022-04-30 | Discharge: 2022-04-30 | Disposition: A | Discharge status: Home / Self Care | Payer: Medicare PPO | Source: Ambulatory Visit | Attending: Interventional Radiology | Admitting: Interventional Radiology

## 2022-04-30 ENCOUNTER — Inpatient Hospital Stay: Payer: Medicare PPO

## 2022-04-30 ENCOUNTER — Telehealth: Payer: Self-pay

## 2022-04-30 ENCOUNTER — Telehealth: Payer: Self-pay | Admitting: Internal Medicine

## 2022-04-30 ENCOUNTER — Other Ambulatory Visit: Payer: Medicare PPO

## 2022-04-30 DIAGNOSIS — C787 Secondary malignant neoplasm of liver and intrahepatic bile duct: Secondary | ICD-10-CM

## 2022-04-30 NOTE — Telephone Encounter (Signed)
Calling to report possible interaction severity level 2 between bupropion and trintellix

## 2022-04-30 NOTE — Patient Outreach (Signed)
  Care Coordination   Follow Up Visit Note   04/30/2022 Name: Darlene [name] MRN: [id number] DOB: 1950/10/19  Outreach call to follow up with patient from recent [facility] call. Patient shares that she completed oncology MD appt a few days. She has been told "nothing else can be done for her." Patient states she has enrolled in [facility] hospice-staff has been out to see her. Support given to patient. She states she is coping and dealing with the news as best she can. She has supportive spouse in the home. Advised patient to contact hospice for any needs and reminded her that they are available to assist her 24/7. She voiced understanding and appreciation.     Care Coordination Interventions:  Yes, provided   Follow up plan: No further intervention required.   Encounter Outcome:  Pt. Visit Completed

## 2022-04-30 NOTE — Telephone Encounter (Signed)
Cancelled appointments per staff message. Patient is aware of the cancelled appointments.

## 2022-04-30 NOTE — Progress Notes (Signed)
Patient ID: Darlene [name], female   DOB: 04/28/1950, 72 y.o.   MRN: [id number]        Chief Complaint: Metastatic breast cancer   Referring Physician(s): Gudena,Vinay (Oncology) Glenna Fellows (Nephrology)   History of Present Illness: Darlene [name] is a 72 y.o. female with past medical history significant for CHF, chronic kidney disease, diabetes, hyperlipidemia, hypertension, obstructive sleep apnea (not currently using his CPAP) and most significant for metastatic breast cancer who is referred for potential Y90 radioembolization, initially seen in telemedicine consultation on 04/02/2022.  She returns today following acquisition of PET/CT performed 04/19/2022.  Unfortunately, in the interval, the patient was admitted to the hospital with GI bleeding found to have a ulcer which was treated with cauterization.  Since this time, patient's functional status has significantly deteriorated and she has been transitioned to hospice/comfort care.   Past Medical History:  Diagnosis Date   Anemia    hx of - during 1st round chemo   Anxiety    Arthritis    in fingers, shoulders   Back pain    Balance problem    Breast cancer    Breast cancer of upper-outer quadrant of right female breast 07/22/2014   Bunion, right foot    CHF (congestive heart failure)    Chronic kidney disease    creatininei levels high per pt    Clumsiness    Constipation    Depression    Diabetes mellitus without complication    20+ years type 2    Diabetic retinopathy    torn retina   Eating disorder    binge eating   Epistaxis 08/26/2014   Fatty liver    Foot drop, left foot    HCAP (healthcare-associated pneumonia) 12/18/2014   Heart murmur    never had any problems   History of radiation therapy 04/05/15-05/19/15   right chest wall was treated to 50.4 Gy in 28 fractions, right mastectomy scar was treated to 10 Gy in 5 fractions   Hyperlipidemia    Hypertension    Joint pain    Multiple food allergies     Neuromuscular disorder    diabetic neuropathy   Obesity    Obstructive sleep apnea on CPAP    does not use cpap all the time has lost 160 lbs    Ovarian cyst rupture    possible   Pneumonia    Pneumonia 11/2014   Radiation 04/05/15-05/19/15   right chest wall 50.4 Gy, mastectomy scar 10 Gy   Sleep apnea    Thyroid nodule 06/2014    Past Surgical History:  Procedure Laterality Date   ANAL RECTAL MANOMETRY N/A 08/01/2021   Procedure: ANO RECTAL MANOMETRY;  Surgeon: Tressia Danas, MD;  Location: [facility];  Service: Gastroenterology;  Laterality: N/A;   APPENDECTOMY     BIOPSY THYROID Bilateral 06/2014   2 nodules - Benign    BREAST LUMPECTOMY WITH RADIOACTIVE SEED AND SENTINEL LYMPH NODE BIOPSY Right 12/27/2014   Procedure: BREAST LUMPECTOMY WITH RADIOACTIVE SEED AND SENTINEL LYMPH NODE BIOPSY;  Surgeon: Almond Lint, MD;  Location: [facility];  Service: General;  Laterality: Right;   BREAST REDUCTION SURGERY Bilateral 01/06/2015   Procedure: BILATERAL BREAST REDUCTION, RIGHT ONCOPLASTIC RECONSTRUCTION),LEFT BREAST REDUCTION FOR SYMMETRY;  Surgeon: Glenna Fellows, MD;  Location: [facility];  Service: Plastics;  Laterality: Bilateral;   BREATH TEK H PYLORI N/A 09/15/2012   Procedure: BREATH TEK H PYLORI;  Surgeon: Valarie Merino, MD;  Location: [facility];  Service:  General;  Laterality: N/A;   BUNIONECTOMY Left 12/2013   CARPAL TUNNEL RELEASE Right    CARPAL TUNNEL RELEASE Left    CATARACT EXTRACTION     6/19   COLONOSCOPY     DUPUYTREN CONTRACTURE RELEASE     ESOPHAGOGASTRODUODENOSCOPY (EGD) WITH PROPOFOL N/A 04/21/2022   Procedure: ESOPHAGOGASTRODUODENOSCOPY (EGD) WITH PROPOFOL;  Surgeon: Beverley Fiedler, MD;  Location: [facility];  Service: Gastroenterology;  Laterality: N/A;   GASTRIC ROUX-EN-Y N/A 11/02/2012   Procedure: LAPAROSCOPIC ROUX-EN-Y GASTRIC;  Surgeon: Valarie Merino, MD;  Location: [facility];  Service: General;  Laterality: N/A;   HEMOSTASIS  CONTROL  04/21/2022   Procedure: HEMOSTASIS CONTROL;  Surgeon: Beverley Fiedler, MD;  Location: [facility];  Service: Gastroenterology;;   IR GENERIC HISTORICAL  03/18/2016   IR US GUIDE VASC ACCESS LEFT 03/18/2016 Richarda Overlie, MD [facility]   IR GENERIC HISTORICAL  03/18/2016   IR FLUORO GUIDE CV LINE LEFT 03/18/2016 Richarda Overlie, MD [facility]   LAPAROSCOPIC APPENDECTOMY N/A 07/22/2015   Procedure: APPENDECTOMY LAPAROSCOPIC;  Surgeon: Karie Soda, MD;  Location: [facility];  Service: General;  Laterality: N/A;   MASTECTOMY Right 02/15/2015   modified   MODIFIED MASTECTOMY Right 02/15/2015   Procedure: RIGHT MODIFIED RADICAL MASTECTOMY;  Surgeon: Almond Lint, MD;  Location[facility];  Service: General;  Laterality: Right;   ORIF WRIST FRACTURE Left 05/11/2019   Procedure: OPEN REDUCTION INTERNAL FIXATION (ORIF) left distal radius fracture;  Surgeon: Sheral Apley, MD;  Location: [facility];  Service: Orthopedics;  Laterality: Left;  with block   PORT-A-CATH REMOVAL Left 10/10/2015   Procedure: PORT REMOVAL;  Surgeon: Almond Lint, MD;  Location: [facility];  Service: General;  Laterality: Left;   PORTACATH PLACEMENT Left 08/02/2014   Procedure: INSERTION PORT-A-CATH;  Surgeon: Almond Lint, MD;  Location: [facility];  Service: General;  Laterality: Left;   PORTACATH PLACEMENT N/A 11/05/2016   Procedure: INSERTION PORT-A-CATH;  Surgeon: Almond Lint, MD;  Location: [facility];  Service: General;  Laterality: N/A;   RETINAL LASER PROCEDURE     retina tear on left eye   ROTATOR CUFF REPAIR Right 2005   SCAR REVISION Right 12/08/2015   Procedure: COMPLEX REPAIR RIGHT CHEST 10-15CM;  Surgeon: Glenna Fellows, MD;  Location: [facility];  Service: Plastics;  Laterality: Right;   TOE SURGERY  1970s   bone spur   TRIGGER FINGER RELEASE     x3    Allergies: Antihistamines, diphenhydramine-type; Nsaids; Peanut allergen powder-dnfp; and Tape  Medications: Prior to Admission medications    Medication Sig Start Date End Date Taking? Authorizing Provider  Accu-Chek Softclix Lancets lancets 1 each by Other route 4 (four) times daily. Test blood sugars 4 x times daily 05/08/21   Quillian Quince D, MD  Bempedoic Acid-Ezetimibe (NEXLIZET) 180-10 MG TABS Take 1 tablet by mouth daily. 08/08/21   Laurey Morale, MD  Blood Glucose Calibration (ACCU-CHEK GUIDE CONTROL) LIQD 1 Bottle by In Vitro route as needed. 04/15/19   Quillian Quince D, MD  Blood Glucose Monitoring Suppl (ACCU-CHEK GUIDE) w/Device KIT 1 each by Does not apply route 4 (four) times daily. Use to test blood sugars 4x a day 03/08/19   Quillian Quince D, MD  buPROPion [facility] SR) 200 MG 12 hr tablet Take one tablet by mouth once daily. Patient taking differently: Take 200 mg by mouth daily. Take one tablet by mouth once daily. 02/26/22   Wilder Glade, MD  CALCIUM CITRATE-VITAMIN  D PO Take 1 tablet by mouth 3 (three) times daily.     [provider]  carvedilol (COREG) 6.25 MG tablet Take 1 tablet (6.25 mg total) by mouth 2 (two) times daily. 11/02/21   Laurey Morale, MD  cholecalciferol (VITAMIN D) 1000 units tablet Take 1,000 Units by mouth daily.    [provider]  Cyanocobalamin (VITAMIN B-12 PO) Take 1 tablet by mouth every 14 (fourteen) days.    [provider]  furosemide (LASIX) 20 MG tablet Take 0.5 tablets (10 mg total) by mouth daily. 04/29/22   Serena Croissant, MD  glucose blood (ACCU-CHEK GUIDE) test strip Use Accu Chek test strips to check blood sugar 4 times daily. DX:E11.21 02/06/22   Serena Croissant, MD  insulin aspart (NOVOLOG) 100 UNIT/ML injection Inject into the skin daily as needed for high blood sugar. Per patient, sliding scale with meals as needed    [provider]  insulin glargine (LANTUS SOLOSTAR) 100 UNIT/ML Solostar Pen Inject 11 Units into the skin daily. 04/24/22   Hongalgi, Maximino Greenland, MD  KERENDIA 20 MG TABS TAKE 1 TABLET EVERY DAY Patient taking differently: Take 20  mg by mouth daily. 01/22/22   Laurey Morale, MD  Lactobacillus ([employer]S ACIDOPHILUS PROBIOTIC) 0.5 MG TABS Take 1 tablet by mouth daily. 10/19/21   Serena Croissant, MD  loperamide (IMODIUM) 2 MG capsule Take 2 mg by mouth as needed for diarrhea or loose stools.    [provider]  loratadine (CLARITIN) 10 MG tablet Take 10 mg by mouth daily.    [provider]  melatonin 5 MG TABS Take 5 mg by mouth at bedtime.    [provider]  Multiple Vitamins-Minerals (OPURITY) TABS Take 1 tablet by mouth 2 (two) times daily.    [provider]  omeprazole (PRILOSEC) 40 MG capsule Take 1 capsule (40 mg total) by mouth 2 (two) times daily before a meal. Open capsule, sprinkle granules in applesauce  & take two times daily for 8 weeks then consider reducing to once daily. 04/24/22   Hongalgi, Maximino Greenland, MD  ondansetron (ZOFRAN) 8 MG tablet Take 1 tablet (8 mg total) by mouth every 8 (eight) hours as needed for nausea or vomiting. 03/21/22   Serena Croissant, MD  polyethylene glycol (MIRALAX / GLYCOLAX) 17 g packet Take 17 g by mouth daily as needed for moderate constipation.    [provider]  prochlorperazine (COMPAZINE) 10 MG tablet Take 1 tablet (10 mg total) by mouth every 6 (six) hours as needed (Nausea or vomiting). 03/21/22   Serena Croissant, MD  Propylene Glycol (SYSTANE BALANCE OP) Place 1-2 drops into both eyes 3 (three) times daily as needed (for dry eyes).     [provider]  SURE COMFORT PEN NEEDLES 32G X 4 MM MISC Use as instructed to inject insulin and Victoza daily. 09/21/20   Quillian Quince D, MD  tobramycin (TOBREX) 0.3 % ophthalmic ointment Place 1 application into both eyes as needed (Takes day before eye injections).    [provider]  vortioxetine HBr (TRINTELLIX) 20 MG TABS tablet Take 1 tablet (20 mg total) by mouth at bedtime. 02/26/22   Wilder Glade, MD     Family History  Problem Relation Age of Onset   CVA Mother    Heart disease  Mother    Sudden death Mother    Diabetes Father    Heart failure Father    Hypertension Father    Kidney disease  Father    Obesity Father    Hypertension Sister    Diabetes Brother    Breast cancer Paternal Grandmother 61   Diabetes Paternal Uncle    Ovarian cancer Neg Hx     Social History   Socioeconomic History   Marital status: Married    Spouse name: Maurine Minister   Number of children: 0   Years of education: Bachelor's   Highest education level: Bachelor's degree (e.g., BA, AB, BS)  Occupational History   Occupation: Adult nurse: [employer]  Tobacco Use   Smoking status: Never   Smokeless tobacco: Never  Vaping Use   Vaping Use: Never used  Substance and Sexual Activity   Alcohol use: Yes    Alcohol/week: 1.0 - 2.0 standard drink of alcohol    Types: 1 - 2 Standard drinks or equivalent per week    Comment: social   Drug use: No   Sexual activity: Yes    Partners: Male    Birth control/protection: Post-menopausal, Other-see comments    Comment: husband vasectomy  Other Topics Concern   Not on file  Social History Narrative   Patient is married Brewing technologist) and lives at home with her husband.   Patient does not have any children.   Patient is working for a [employer].   Patient has a Bachelor's degree.   Patient is right-handed.   Patient does not drink any caffeine.      Right breast removed   Social Determinants of Health   Financial Resource Strain: Low Risk  (05/19/2021)   Overall Financial Resource Strain (CARDIA)    Difficulty of Paying Living Expenses: Not hard at all  Food Insecurity: No Food Insecurity (04/25/2022)   Hunger Vital Sign    Worried About Running Out of Food in the Last Year: Never true    Ran Out of Food in the Last Year: Never true  Transportation Needs: No Transportation Needs (04/25/2022)   PRAPARE - Administrator, Civil Service (Medical): No    Lack of Transportation (Non-Medical): No   Recent Concern: Transportation Needs - Unmet Transportation Needs (04/25/2022)   PRAPARE - Transportation    Lack of Transportation (Medical): Yes    Lack of Transportation (Non-Medical): Yes  Physical Activity: Unknown (05/19/2021)   Exercise Vital Sign    Days of Exercise per Week: 1 day    Minutes of Exercise per Session: Not on file  Stress: No Stress Concern Present (05/19/2021)   [employer] of Occupational Health - Occupational Stress Questionnaire    Feeling of Stress : Not at all  Social Connections: Socially Integrated (05/19/2021)   Social Connection and Isolation Panel [NHANES]    Frequency of Communication with Friends and Family: Three times a week    Frequency of Social Gatherings with Friends and Family: Twice a week    Attends Religious Services: 1 to 4 times per year    Active Member of [employer] or Organizations: Yes    Attends Engineer, structural: More than 4 times per year    Marital Status: Married    ECOG Status: 3 - Symptomatic, >50% confined to bed  Review of Systems  Review of Systems: A 12 point ROS discussed and pertinent positives are indicated in the HPI above.  All other systems are negative.   Physical Exam No direct physical exam was performed (except for noted visual exam findings with Video Visits).   Vital Signs: LMP 10/22/2007  Imaging:  Following examinations were personally reviewed in detail:  PET/CT-04/19/2022; CT of the chest, abdomen pelvis-03/23/2022; 01/17/2022  PET/CT demonstrates continued marked progression of bi-lobar hepatic metastatic disease, now with hypermetabolic disease encompassing nearly the entirety of the right lobe of the liver.  Additionally, there is continued progression of bulky hypermetabolic porta hepatis and retroperitoneal lymphadenopathy.  Redemonstrated diffuse/extensive osseous metastatic disease.  Patient has developed a trace amount of perihepatic ascites.   NM PET Image Restage (PS) Skull  Base to Thigh (F-18 FDG)  Result Date: 04/22/2022 CLINICAL DATA:  Subsequent treatment strategy for breast cancer with liver metastases. EXAM: NUCLEAR MEDICINE PET SKULL BASE TO THIGH TECHNIQUE: 8.6 mCi F-18 FDG was injected intravenously. Full-ring PET imaging was performed from the skull base to thigh after the radiotracer. CT data was obtained and used for attenuation correction and anatomic localization. Fasting blood glucose: 99 mg/dl COMPARISON:  CT chest abdomen pelvis dated 03/23/2022 FINDINGS: Mediastinal blood pool activity: SUV max 1.5 Liver activity: SUV max NA NECK: No hypermetabolic cervical lymphadenopathy. Incidental CT findings: None. CHEST: Status post right mastectomy. Left supraclavicular nodal metastases measuring up to 14 mm short axis at the left thoracic inlet, max SUV 18.4. 10 mm short axis subcarinal nodal metastasis a max SUV 4.6. Associated hypermetabolism in the right hilar region, max SUV 3.7. When compared to recent CT chest, this appearance is unchanged. Evaluation lung parenchyma is constrained by respiratory motion. The patient's small bilateral pulmonary nodules noted on recent CT are not well visualized on the current study. No associated hypermetabolism, although the nodules were beneath the size threshold for PET sensitivity. Left chest port terminates at the cavoatrial junction. Incidental CT findings: Atherosclerotic calcifications of the aortic arch. Severe three-vessel coronary atherosclerosis. ABDOMEN/PELVIS: Widespread/diffuse hepatic metastases including a large aggregate metastasis in the central liver. Associated hypermetabolism, max SUV 6.5. Although the unenhanced CT is difficult to compare to the prior enhanced study, this appears progressive. No abnormal hypermetabolism in the pancreas, spleen, or adrenal glands. Widespread upper abdominal and periaortic nodal metastases. Representative 2.9 cm short axis portacaval node, grossly unchanged, max SUV 15.7. Overall  appearance is similar. Small volume perihepatic and pelvic ascites, mildly progressive. No associated hypermetabolism. Incidental CT findings: Cholelithiasis. 7 mm nonobstructing left lower pole renal calculus. Gastric bypass with gastrojejunostomy. Atherosclerotic calcifications the abdominal aorta and branch vessels. Sigmoid diverticulosis, without evidence of diverticulitis. Calcified uterine fibroids. SKELETON: Diffuse hypermetabolism throughout the visualized axial and appendicular skeleton, favored to reflect marrow stimulation. No focal hypermetabolism to suggest osseous metastases. Incidental CT findings: Degenerative changes of the visualized thoracolumbar spine. IMPRESSION: Widespread hepatic metastases, progressive. Thoracoabdominal nodal metastases, as above, grossly unchanged. Small bilateral pulmonary nodules noted on recent CT are not well visualized on the current study, possibly secondary to motion degradation. Small volume perihepatic and pelvic ascites, mildly progressive. Additional ancillary findings as above. Electronically Signed   By: Charline Bills M.D.   On: 04/22/2022 01:27   CT ABDOMEN PELVIS WO CONTRAST  Result Date: 04/21/2022 CLINICAL DATA:  GI bleeding. History of metastatic breast cancer. * Tracking Code: BO * EXAM: CT ABDOMEN AND PELVIS WITHOUT CONTRAST TECHNIQUE: Multidetector CT imaging of the abdomen and pelvis was performed following the standard protocol without IV contrast. RADIATION DOSE REDUCTION: This exam was performed according to the departmental dose-optimization program which includes automated exposure control, adjustment of the mA and/or kV according to patient size and/or use of iterative reconstruction technique. COMPARISON:  PET-CT 04/19/2022. CT of the chest, abdomen and pelvis 03/23/2022. FINDINGS: Lower  chest: Atherosclerosis of the aorta and coronary arteries. A small pericardial effusion is unchanged. Precordial lymph node measuring 1 cm short axis on  image 6/2 appears similar to recent CT. There is subpleural reticulation at both lung bases with scattered small pulmonary nodules, felt to be suspicious for metastatic disease on recent CT. Hepatobiliary: Widespread hepatic metastatic disease has progressed from previous CTs. Please refer to recent PET-CT. There are small calcified gallstones with mild chronic gallbladder wall thickening. No significant biliary dilatation. Pancreas: Unremarkable. No pancreatic ductal dilatation or surrounding inflammatory changes. Spleen: Normal in size without focal abnormality. Adrenals/Urinary Tract: The adrenal glands appear stable without suspicious findings. Diffuse renovascular calcifications are present bilaterally. There are nonobstructing calculi in the lower pole of the left kidney measuring up to 7 mm in diameter. No evidence of ureteral calculus or hydronephrosis. The bladder appears unremarkable for its degree of distention. Stomach/Bowel: No enteric contrast administered. There are postsurgical changes from previous gastric bypass procedure. No small bowel distension, wall thickening or surrounding inflammation. There is mild nonspecific wall thickening in the sigmoid colon associated with underlying diverticulosis. No definite acute surrounding inflammation. No evidence of bowel obstruction. Vascular/Lymphatic: Multiple enlarged lymph nodes are present within the porta hepatis and upper retroperitoneum consistent with metastatic disease. There is a portacaval node measuring 5.0 x 2.5 cm on image 31/2. Diffuse aortic and branch vessel atherosclerosis without evidence of aneurysm. Reproductive: Calcified uterine fibroid on the right. No suspicious adnexal findings. Other: Increased ascites compared with CT done earlier this month, primarily in the pelvis and around the liver. This appears similar in volume to more recent PET-CT. No focal extraluminal fluid collection or pneumoperitoneum. Musculoskeletal: No acute  osseous findings. No focal lytic or blastic lesions are identified. There are degenerative changes in the spine, greatest at L4-5. IMPRESSION: 1. No definite acute findings or explanation for GI bleeding on noncontrast CT. 2. Widespread hepatic metastatic disease has progressed from previous CTS. Upper abdominal lymphadenopathy consistent with metastatic disease. See recent PET-CT. 3. Increased ascites compared with CT done earlier this month, primarily in the pelvis and around the liver. This appears similar in volume to more recent PET-CT. 4. Sigmoid diverticulosis with mild nonspecific wall thickening. No evidence of bowel obstruction or perforation. 5. Nonobstructing left renal calculi. 6. Cholelithiasis with chronic gallbladder wall thickening. 7.  Aortic Atherosclerosis (ICD10-I70.0). Electronically Signed   By: Carey Bullocks M.D.   On: 04/21/2022 11:50    Labs:  CBC: Recent Labs    04/22/22 1500 04/23/22 0401 04/24/22 0415 04/29/22 1303  WBC 39.5* 36.8* 38.5* 21.7*  HGB 8.2* 8.9* 9.2* 9.6*  HCT 25.6* 27.7* 28.8* 30.0*  PLT 312 334 364 454*    COAGS: Recent Labs    08/30/21 1130 04/22/22 0020  INR 1.1 1.4*    BMP: Recent Labs    04/22/22 0020 04/22/22 0837 04/23/22 0401 04/24/22 0415 04/29/22 1303  NA 135  --  137 136 138  K 5.6* 5.4* 4.3 3.9 5.5*  CL 107  --  111 110 108  CO2 19*  --  19* 21* 21*  GLUCOSE 143*  --  138* 104* 140*  BUN 56*  --  44* 38* 52*  CALCIUM 8.2*  --  8.2* 8.3* 9.1  CREATININE 2.13*  --  1.77* 1.59* 1.93*  GFRNONAA 25*  --  31* 35* 28*    LIVER FUNCTION TESTS: Recent Labs    04/02/22 1438 04/08/22 1018 04/21/22 0919 04/29/22 1303  BILITOT 0.4 0.6 0.7 0.5  AST 114* 88* 59* 126*  ALT 33 35 22 36  ALKPHOS 233* 232* 214* 308*  PROT 6.6 6.4* 5.0* 5.4*  ALBUMIN 3.5 3.4* 2.7* 2.9*    TUMOR MARKERS: Recent Labs    04/29/22 1303  CEA 0,981.196,623.04*    Assessment and Plan:  Milus BanisterMargaret [name] Better is a 72 y.o. female with past  medical history significant for CHF, chronic kidney disease, diabetes, hyperlipidemia, hypertension, obstructive sleep apnea (not currently using his CPAP) and most significant for metastatic breast cancer who is referred for potential Y90 radioembolization, initially seen in telemedicine consultation on 04/02/2022.  She returns today following acquisition of PET/CT performed 04/19/2022.  Following examinations were personally reviewed in detail:  PET/CT-04/19/2022; CT of the chest, abdomen pelvis-03/23/2022; 01/17/2022  PET/CT demonstrates continued marked progression of bi-lobar hepatic metastatic disease, now with hypermetabolic disease encompassing nearly the entirety of the right lobe of the liver.  The patient has developed a trace amount of perihepatic ascites.  Additionally, there is continued progression of bulky hypermetabolic porta hepatis and retroperitoneal lymphadenopathy.  Redemonstrated diffuse/extensive osseous metastatic disease.  Unfortunately, in the interval, the patient was admitted to the hospital with GI bleeding found to have a ulcer which was treated with cauterization.  Since this time, patient's functional status has significantly deteriorated and she has been transitioned to hospice/comfort care.  Based on the marked recent progression of disease as well as declining functional status I feel the patient would receive no meaningful benefit from undergoing Y90 radioembolization and agree with transitioning to hospice/comfort care.  This was discussed at length with the patient who is in agreement with the plan of care.  PLAN: - Transition to hospice/comfort care.  A copy of this report was sent to the requesting provider on this date.  Electronically Signed: Simonne ComeJohn Ryann Leavitt 04/30/2022, 8:27 AM   I spent a total of 10 Minutes in remote  clinical consultation, greater than 50% of which was counseling/coordinating care for metastatic breast cancer.    Visit type: Audio only  (telephone). Audio (no video) only due to patient's lack of internet/smartphone capability. [facility], [address], [place], [place]. This visit type was conducted due to national recommendations for restrictions regarding the COVID-19 Pandemic (e.g. social distancing).  This format is felt to be most appropriate for this patient at this time.  All issues noted in this document were discussed and addressed.

## 2022-05-01 ENCOUNTER — Telehealth: Payer: Self-pay | Admitting: *Deleted

## 2022-05-01 ENCOUNTER — Inpatient Hospital Stay: Payer: Medicare PPO

## 2022-05-01 NOTE — Telephone Encounter (Signed)
Darlene [name] informed of verbal orders. Darlene [name] reported the patient is currently in hospice and they are not currently treating the patient

## 2022-05-01 NOTE — Telephone Encounter (Signed)
FW: Inpatient Notes Received: Darlene Folk, MD  Mariane Duval, CMA Please arrange office follow-up in 1 month.  Thank you.  KLB

## 2022-05-01 NOTE — Telephone Encounter (Signed)
Scheduled follow up for patient on 06/25/22 with Doug Sou, PA. Mychart message sent to patient.

## 2022-05-01 NOTE — Telephone Encounter (Signed)
I called Onalee Hua and he stated he is unsure of the prescriber of the medications and as of now the patient has transitioned to [facility].  Message sent to PCP.

## 2022-05-02 ENCOUNTER — Other Ambulatory Visit (HOSPITAL_COMMUNITY): Payer: Medicare PPO

## 2022-05-02 ENCOUNTER — Encounter (HOSPITAL_COMMUNITY): Payer: Medicare PPO

## 2022-05-02 ENCOUNTER — Ambulatory Visit (HOSPITAL_COMMUNITY): Payer: Medicare PPO

## 2022-05-02 ENCOUNTER — Encounter (HOSPITAL_COMMUNITY): Payer: Self-pay

## 2022-05-07 ENCOUNTER — Inpatient Hospital Stay: Payer: Medicare PPO | Admitting: Internal Medicine

## 2022-05-13 ENCOUNTER — Inpatient Hospital Stay: Payer: Medicare PPO | Admitting: Hematology and Oncology

## 2022-05-13 ENCOUNTER — Inpatient Hospital Stay: Payer: Medicare PPO

## 2022-05-14 ENCOUNTER — Other Ambulatory Visit: Payer: Medicare PPO

## 2022-05-16 ENCOUNTER — Ambulatory Visit (HOSPITAL_COMMUNITY): Payer: Medicare PPO

## 2022-05-16 ENCOUNTER — Other Ambulatory Visit (HOSPITAL_COMMUNITY): Payer: Medicare PPO

## 2022-05-17 ENCOUNTER — Ambulatory Visit: Payer: Medicare PPO | Admitting: Podiatry

## 2022-05-20 ENCOUNTER — Inpatient Hospital Stay: Payer: Medicare PPO

## 2022-05-20 ENCOUNTER — Other Ambulatory Visit: Payer: Medicare PPO

## 2022-05-20 ENCOUNTER — Ambulatory Visit: Payer: Medicare PPO

## 2022-05-21 ENCOUNTER — Inpatient Hospital Stay: Payer: Medicare PPO

## 2022-05-22 ENCOUNTER — Ambulatory Visit: Payer: Medicare PPO

## 2022-05-22 DEATH — deceased

## 2022-06-03 ENCOUNTER — Ambulatory Visit: Payer: Medicare PPO | Admitting: Hematology and Oncology

## 2022-06-03 ENCOUNTER — Other Ambulatory Visit: Payer: Medicare PPO

## 2022-06-03 ENCOUNTER — Inpatient Hospital Stay: Payer: Medicare PPO

## 2022-06-03 ENCOUNTER — Ambulatory Visit: Payer: Medicare PPO

## 2022-06-10 ENCOUNTER — Ambulatory Visit: Payer: Medicare PPO

## 2022-06-10 ENCOUNTER — Inpatient Hospital Stay: Payer: Medicare PPO

## 2022-06-10 ENCOUNTER — Other Ambulatory Visit: Payer: Medicare PPO

## 2022-06-11 ENCOUNTER — Inpatient Hospital Stay: Payer: Medicare PPO

## 2022-06-12 ENCOUNTER — Ambulatory Visit: Payer: Medicare PPO

## 2022-06-13 ENCOUNTER — Ambulatory Visit (HOSPITAL_COMMUNITY): Payer: Medicare PPO

## 2022-06-13 ENCOUNTER — Other Ambulatory Visit (HOSPITAL_COMMUNITY): Payer: Medicare PPO

## 2022-06-25 ENCOUNTER — Ambulatory Visit: Payer: Medicare PPO | Admitting: Gastroenterology

## 2022-08-21 NOTE — Progress Notes (Signed)
This encounter was created in error - please disregard.

## 2022-10-28 ENCOUNTER — Other Ambulatory Visit (HOSPITAL_BASED_OUTPATIENT_CLINIC_OR_DEPARTMENT_OTHER): Payer: Self-pay

## 2022-11-04 ENCOUNTER — Ambulatory Visit: Payer: Medicare PPO | Admitting: Neurology

## 2022-12-12 ENCOUNTER — Encounter: Payer: Medicare PPO | Admitting: Internal Medicine

## 2023-09-03 IMAGING — PT NM PET TUM IMG RESTAG (PS) SKULL BASE T - THIGH
7 series · 25 of 25 positions shown · non-contrast
Comparison: 10/03/2020 PET-CT.  12/22/2020 MRI abdomen.

CLINICAL DATA: Subsequent treatment strategy for stage IV right
breast cancer with history of liver metastasis.

EXAM:
NUCLEAR MEDICINE PET SKULL BASE TO THIGH
TECHNIQUE: 8.0 mCi F-18 FDG was injected intravenously. Full-ring PET imaging
was performed from the skull base to thigh after the radiotracer. CT
data was obtained and used for attenuation correction and anatomic
localization.
Fasting blood glucose: 103 mg/dl

[Series 3: pet sk_thigh ac · axial · 5.0mm · 4.07mm/px · z∈[-1547,-671]mm · 6 of 220 slices shown]
[im 1/220]
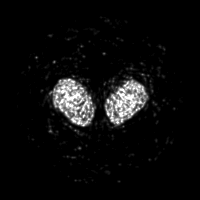
[im 44/220]
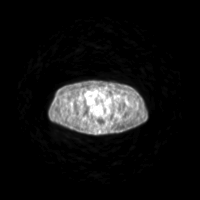
[im 88/220]
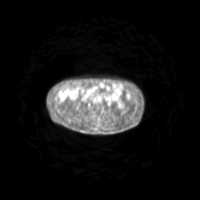
[im 132/220]
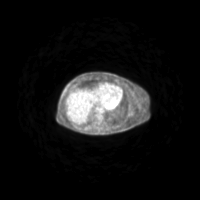
[im 176/220]
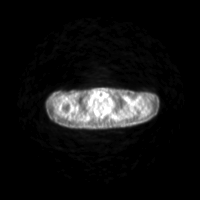
[im 220/220]
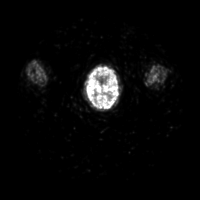

[Series 4: ct sk_thigh 5.0 bf37 · axial · 5.0mm · 0.98mm/px · z∈[-1547,-671]mm · 5 of 220 slices shown]
[im 1/220]
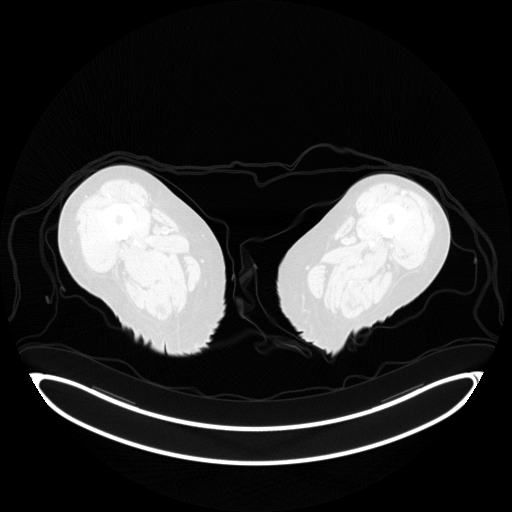
[im 55/220]
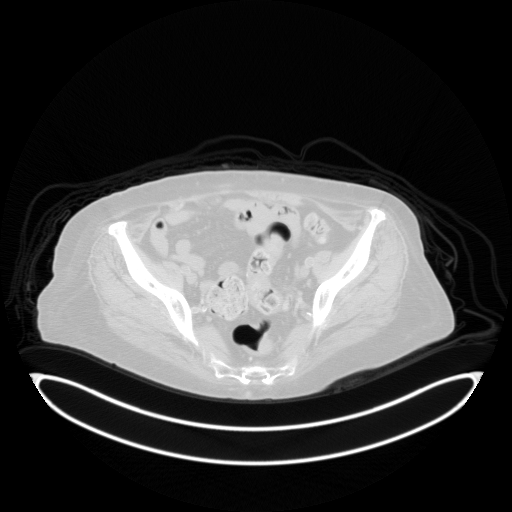
[im 110/220]
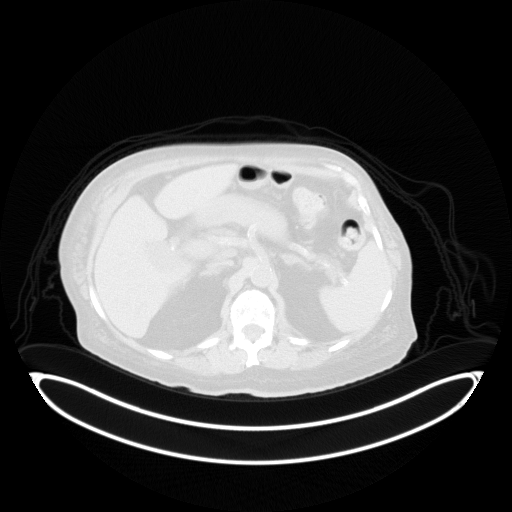
[im 165/220]
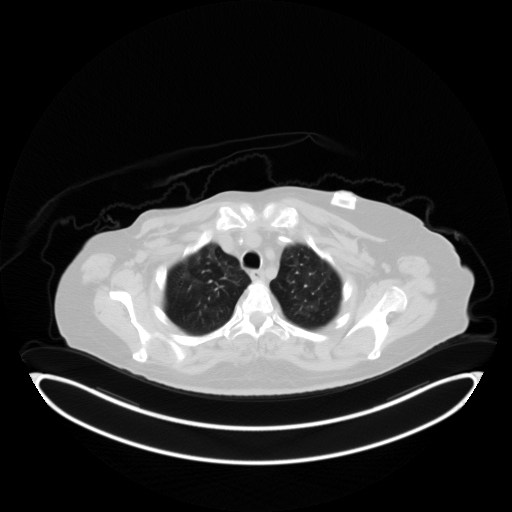
[im 220/220  brain]
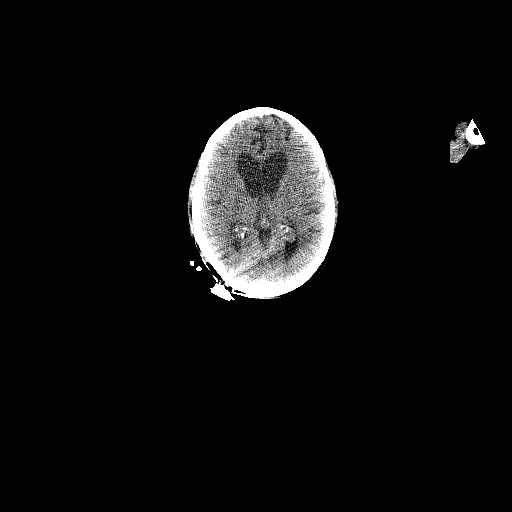

[Series 5: pet sk_thigh nac · axial · 5.0mm · 4.07mm/px · z∈[-1547,-671]mm · 5 of 220 slices shown]
[im 1/220]
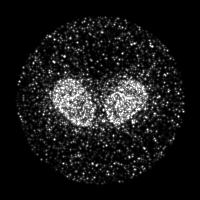
[im 55/220]
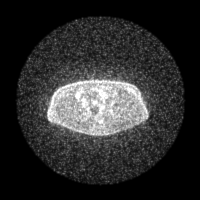
[im 110/220]
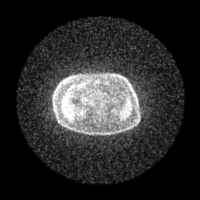
[im 165/220]
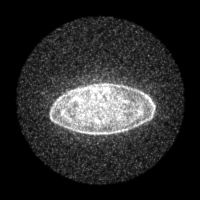
[im 220/220]
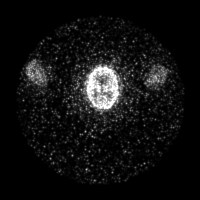

[Series 8: ct sk_thigh 5.0 br59 lung_bone · axial · 5.0mm · 0.77mm/px · z∈[-1136,-836]mm · 2 of 76 slices shown]
[im 1/76]
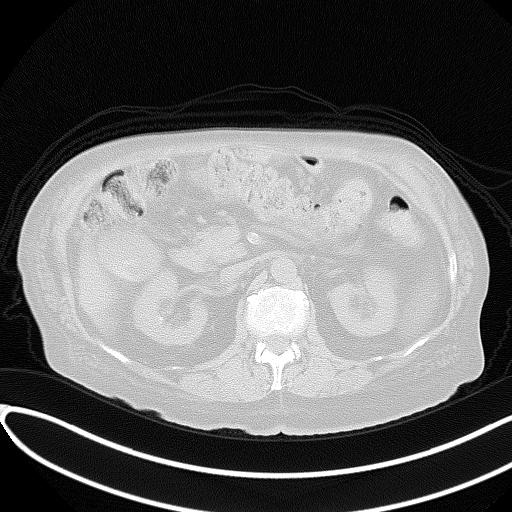
[im 76/76]
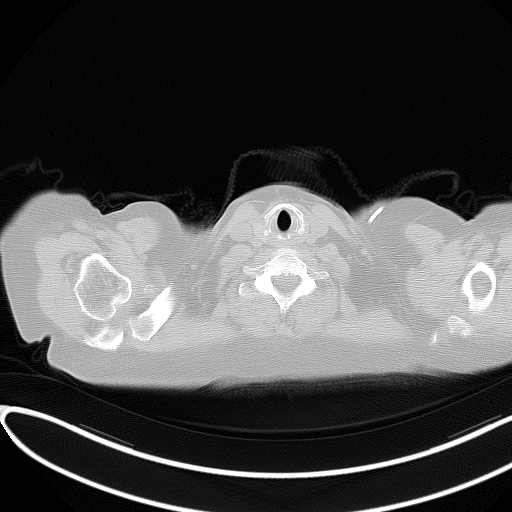

[Series 603: <mip collection> · coronal · 1.82mm/px · 1 of 32 slices shown]
[im 1/32]
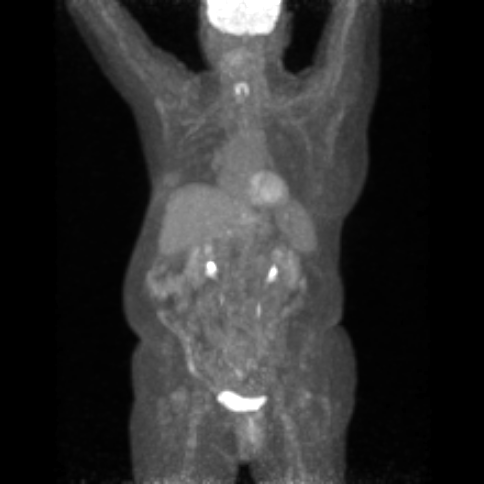

[Series 604: fused cor · 1 of 49 slices shown]
[im 1/49]
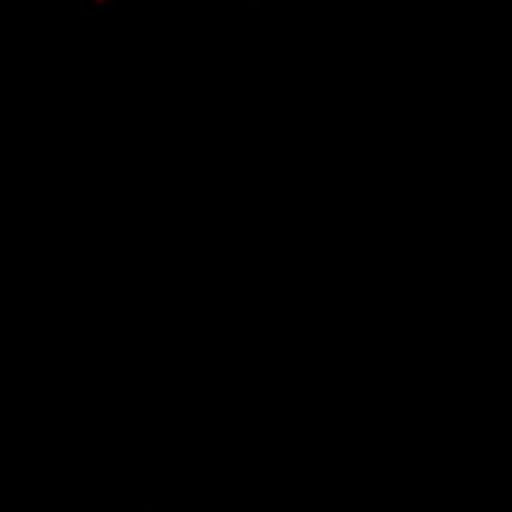

[Series 605: range-ct sk_thigh 5.0 bf37-tra-<alpha range> · 5 of 210 slices shown]
[im 1/210]
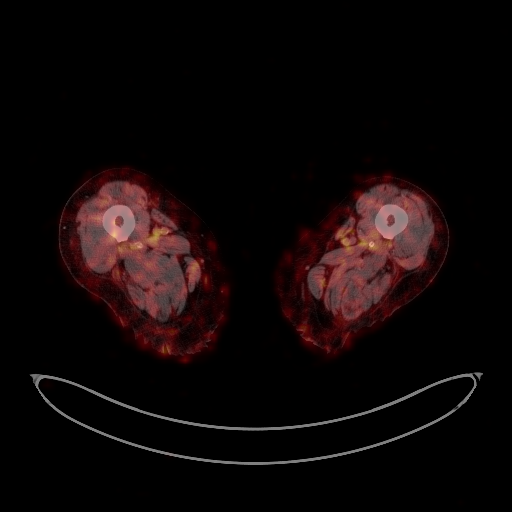
[im 53/210]
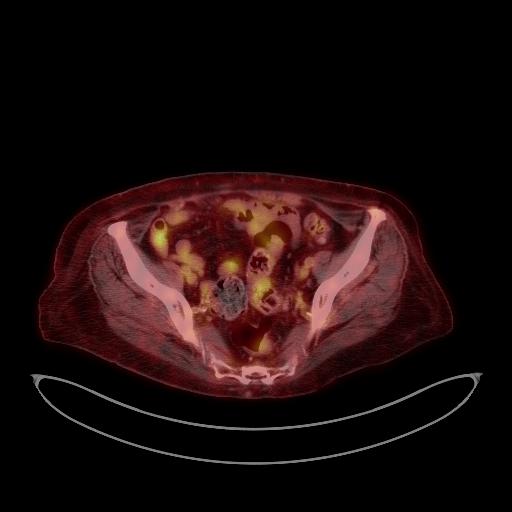
[im 105/210]
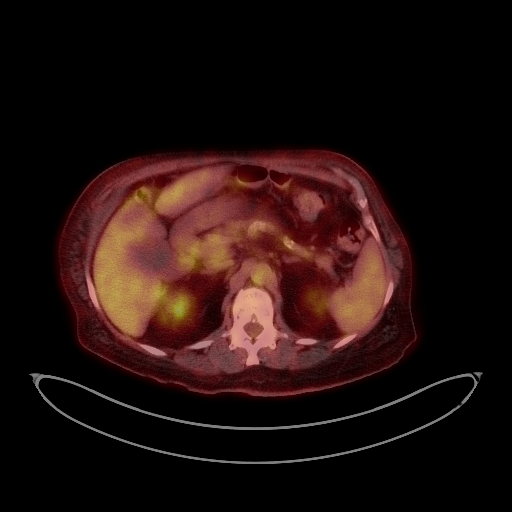
[im 157/210]
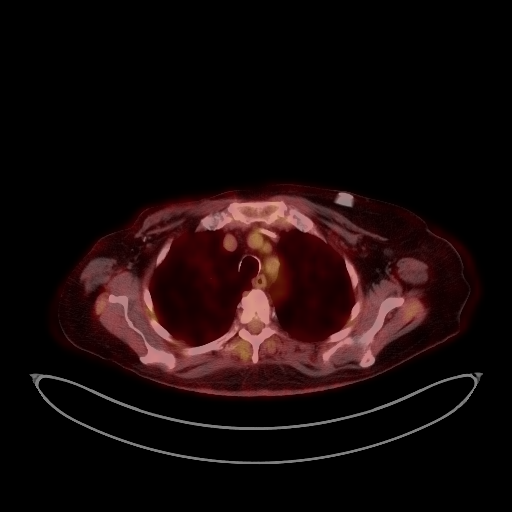
[im 210/210]
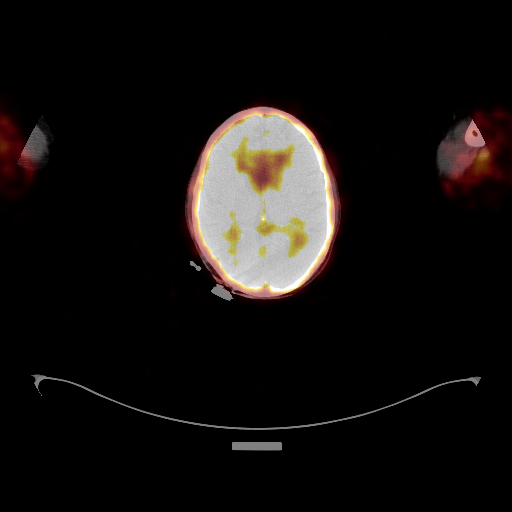

[25 of 25 positions shown; findings below may reference images not displayed]

FINDINGS: Mediastinal blood pool activity: SUV max

Liver activity: SUV max NA

NECK: No hypermetabolic lymph nodes in the neck.

Incidental CT findings: Mixed density 1.8 cm left thyroid nodule,
unchanged, non hypermetabolic. This has been evaluated on previous
imaging. (ref: [HOSPITAL]. [DATE]): 143-50).

CHEST: No enlarged or hypermetabolic axillary, mediastinal or hilar
lymph nodes. No hypermetabolic pulmonary nodules.

Incidental CT findings: Left subclavian Port-A-Cath terminates in
the lower third of the SVC. Coronary atherosclerosis.
Atherosclerotic nonaneurysmal thoracic aorta. Right mastectomy.

ABDOMEN/PELVIS:

No hypermetabolic liver masses. Previously visualized hypermetabolic
hypodense 2.7 x 2.5 cm peripheral right liver dome mass is decreased
to 1.5 x 1.2 cm and demonstrates no residual hypermetabolism. No new
liver masses.

No abnormal hypermetabolic activity within the pancreas, adrenal
glands, or spleen. No hypermetabolic lymph nodes in the abdomen or
pelvis. Previously visualized enlarged hypermetabolic 1.1 cm porta
hepatis node is decreased to 0.5 cm and demonstrates no residual
hypermetabolism.

Incidental CT findings: Cholelithiasis. Stable 1.6 cm left adrenal
nodule with density 14 HU, unchanged back to 08/19/2016 PET-CT, most
compatible with an adenoma. Nonobstructing lower left renal stones,
largest 7 mm. Atherosclerotic nonaneurysmal abdominal aorta. Stable
postsurgical changes from Roux-en-Y gastric bypass surgery. Mild
sigmoid diverticulosis.

SKELETON: No focal hypermetabolic activity to suggest skeletal
metastasis.

Incidental CT findings: none
IMPRESSION: 1. Complete metabolic response. No hypermetabolic metastatic disease
on today's scan.
2. Chronic findings include: Aortic Atherosclerosis ([id number]).
Cholelithiasis. Nonobstructing left nephrolithiasis.
